# Patient Record
Sex: Female | Born: 1950
Health system: Southern US, Community
[De-identification: ages and names within clinical notes are randomized; demographics above are authoritative.]

## PROBLEM LIST (undated history)

## (undated) DIAGNOSIS — F32A Depression, unspecified: Secondary | ICD-10-CM

## (undated) DIAGNOSIS — I509 Heart failure, unspecified: Secondary | ICD-10-CM

## (undated) DIAGNOSIS — Z8719 Personal history of other diseases of the digestive system: Secondary | ICD-10-CM

## (undated) DIAGNOSIS — R0600 Dyspnea, unspecified: Secondary | ICD-10-CM

## (undated) DIAGNOSIS — G459 Transient cerebral ischemic attack, unspecified: Secondary | ICD-10-CM

## (undated) DIAGNOSIS — I219 Acute myocardial infarction, unspecified: Secondary | ICD-10-CM

## (undated) DIAGNOSIS — I499 Cardiac arrhythmia, unspecified: Secondary | ICD-10-CM

## (undated) DIAGNOSIS — R51 Headache: Secondary | ICD-10-CM

## (undated) DIAGNOSIS — I2510 Atherosclerotic heart disease of native coronary artery without angina pectoris: Secondary | ICD-10-CM

## (undated) DIAGNOSIS — M1990 Unspecified osteoarthritis, unspecified site: Secondary | ICD-10-CM

## (undated) DIAGNOSIS — J189 Pneumonia, unspecified organism: Secondary | ICD-10-CM

## (undated) DIAGNOSIS — I10 Essential (primary) hypertension: Secondary | ICD-10-CM

## (undated) DIAGNOSIS — R498 Other voice and resonance disorders: Secondary | ICD-10-CM

## (undated) DIAGNOSIS — F419 Anxiety disorder, unspecified: Secondary | ICD-10-CM

## (undated) DIAGNOSIS — IMO0001 Reserved for inherently not codable concepts without codable children: Secondary | ICD-10-CM

## (undated) DIAGNOSIS — F410 Panic disorder [episodic paroxysmal anxiety] without agoraphobia: Secondary | ICD-10-CM

## (undated) DIAGNOSIS — F329 Major depressive disorder, single episode, unspecified: Secondary | ICD-10-CM

## (undated) DIAGNOSIS — K219 Gastro-esophageal reflux disease without esophagitis: Secondary | ICD-10-CM

## (undated) DIAGNOSIS — E785 Hyperlipidemia, unspecified: Secondary | ICD-10-CM

## (undated) DIAGNOSIS — I4891 Unspecified atrial fibrillation: Secondary | ICD-10-CM

## (undated) DIAGNOSIS — D649 Anemia, unspecified: Secondary | ICD-10-CM

## (undated) DIAGNOSIS — T7840XA Allergy, unspecified, initial encounter: Secondary | ICD-10-CM

## (undated) DIAGNOSIS — J449 Chronic obstructive pulmonary disease, unspecified: Secondary | ICD-10-CM

## (undated) DIAGNOSIS — E119 Type 2 diabetes mellitus without complications: Secondary | ICD-10-CM

## (undated) DIAGNOSIS — Z5189 Encounter for other specified aftercare: Secondary | ICD-10-CM

## (undated) DIAGNOSIS — I639 Cerebral infarction, unspecified: Secondary | ICD-10-CM

## (undated) DIAGNOSIS — R519 Headache, unspecified: Secondary | ICD-10-CM

## (undated) HISTORY — DX: Cerebral infarction, unspecified: I63.9

## (undated) HISTORY — DX: Type 2 diabetes mellitus without complications: E11.9

## (undated) HISTORY — DX: Cardiac arrhythmia, unspecified: I49.9

## (undated) HISTORY — DX: Allergy, unspecified, initial encounter: T78.40XA

## (undated) HISTORY — PX: TUBAL LIGATION: SHX77

## (undated) HISTORY — DX: Anemia, unspecified: D64.9

## (undated) HISTORY — DX: Gastro-esophageal reflux disease without esophagitis: K21.9

## (undated) HISTORY — DX: Essential (primary) hypertension: I10

## (undated) HISTORY — DX: Unspecified osteoarthritis, unspecified site: M19.90

## (undated) HISTORY — DX: Major depressive disorder, single episode, unspecified: F32.9

## (undated) HISTORY — DX: Chronic obstructive pulmonary disease, unspecified: J44.9

## (undated) HISTORY — DX: Transient cerebral ischemic attack, unspecified: G45.9

## (undated) HISTORY — DX: Heart failure, unspecified: I50.9

## (undated) HISTORY — DX: Hyperlipidemia, unspecified: E78.5

## (undated) HISTORY — DX: Reserved for inherently not codable concepts without codable children: IMO0001

## (undated) HISTORY — DX: Depression, unspecified: F32.A

## (undated) HISTORY — PX: ABDOMINAL HYSTERECTOMY: SHX81

## (undated) HISTORY — PX: OOPHORECTOMY: SHX86

## (undated) HISTORY — DX: Anxiety disorder, unspecified: F41.9

---

## 1973-03-29 HISTORY — PX: APPENDECTOMY: SHX54

## 2003-01-09 ENCOUNTER — Encounter: Payer: Self-pay | Admitting: *Deleted

## 2003-01-09 ENCOUNTER — Emergency Department (HOSPITAL_COMMUNITY): Admission: EM | Admit: 2003-01-09 | Discharge: 2003-01-10 | Payer: Self-pay | Admitting: *Deleted

## 2004-07-31 ENCOUNTER — Ambulatory Visit: Payer: Self-pay | Admitting: Internal Medicine

## 2004-09-18 ENCOUNTER — Emergency Department: Payer: Self-pay | Admitting: Emergency Medicine

## 2005-08-02 ENCOUNTER — Emergency Department: Payer: Self-pay | Admitting: Emergency Medicine

## 2007-09-23 ENCOUNTER — Emergency Department: Payer: Self-pay | Admitting: Unknown Physician Specialty

## 2008-08-02 ENCOUNTER — Emergency Department: Payer: Self-pay | Admitting: Emergency Medicine

## 2009-03-21 ENCOUNTER — Emergency Department: Payer: Self-pay | Admitting: Internal Medicine

## 2009-03-24 ENCOUNTER — Emergency Department: Payer: Self-pay | Admitting: Emergency Medicine

## 2009-06-19 ENCOUNTER — Inpatient Hospital Stay: Payer: Self-pay | Admitting: Internal Medicine

## 2009-07-14 ENCOUNTER — Ambulatory Visit: Payer: Self-pay

## 2009-12-29 ENCOUNTER — Ambulatory Visit: Payer: Self-pay | Admitting: Adult Health

## 2010-03-24 ENCOUNTER — Emergency Department: Payer: Self-pay | Admitting: Unknown Physician Specialty

## 2010-04-28 ENCOUNTER — Emergency Department: Payer: Self-pay | Admitting: Emergency Medicine

## 2010-06-10 ENCOUNTER — Emergency Department: Payer: Self-pay | Admitting: Emergency Medicine

## 2010-06-23 ENCOUNTER — Emergency Department: Payer: Self-pay | Admitting: Emergency Medicine

## 2010-11-01 ENCOUNTER — Emergency Department: Payer: Self-pay | Admitting: Emergency Medicine

## 2010-11-26 ENCOUNTER — Ambulatory Visit: Payer: Self-pay | Admitting: "Endocrinology

## 2011-01-26 ENCOUNTER — Ambulatory Visit: Payer: Self-pay

## 2011-06-24 ENCOUNTER — Emergency Department: Payer: Self-pay | Admitting: Emergency Medicine

## 2011-06-24 LAB — COMPREHENSIVE METABOLIC PANEL
Albumin: 3.5 g/dL (ref 3.4–5.0)
Alkaline Phosphatase: 138 U/L — ABNORMAL HIGH (ref 50–136)
Anion Gap: 11 (ref 7–16)
BUN: 17 mg/dL (ref 7–18)
Bilirubin,Total: 0.5 mg/dL (ref 0.2–1.0)
Calcium, Total: 8.6 mg/dL (ref 8.5–10.1)
Chloride: 98 mmol/L (ref 98–107)
Co2: 26 mmol/L (ref 21–32)
Creatinine: 0.69 mg/dL (ref 0.60–1.30)
EGFR (African American): >60
EGFR (Non-African Amer.): >60
Glucose: 431 mg/dL — ABNORMAL HIGH (ref 65–99)
Osmolality: 290 (ref 275–301)
Potassium: 3.9 mmol/L (ref 3.5–5.1)
SGOT(AST): 31 U/L (ref 15–37)
SGPT (ALT): 32 U/L
Sodium: 135 mmol/L — ABNORMAL LOW (ref 136–145)
Total Protein: 7.1 g/dL (ref 6.4–8.2)

## 2011-06-24 LAB — URINALYSIS, COMPLETE
Bacteria: NONE SEEN
Bilirubin,UR: NEGATIVE
Blood: NEGATIVE
Glucose,UR: >=500 mg/dL (ref 0–75)
Ketone: NEGATIVE
Nitrite: NEGATIVE
Ph: 6 (ref 4.5–8.0)
Protein: NEGATIVE
RBC,UR: 7 /HPF (ref 0–5)
Specific Gravity: 1.024 (ref 1.003–1.030)
Squamous Epithelial: 2
WBC UR: 21 /HPF (ref 0–5)

## 2011-06-24 LAB — CBC
HCT: 38 % (ref 35.0–47.0)
HGB: 12.8 g/dL (ref 12.0–16.0)
MCH: 32 pg (ref 26.0–34.0)
MCHC: 33.5 g/dL (ref 32.0–36.0)
MCV: 96 fL (ref 80–100)
Platelet: 222 10*3/uL (ref 150–440)
RBC: 3.98 10*6/uL (ref 3.80–5.20)
RDW: 14.5 % (ref 11.5–14.5)
WBC: 10.5 10*3/uL (ref 3.6–11.0)

## 2011-09-17 ENCOUNTER — Emergency Department: Payer: Self-pay | Admitting: Emergency Medicine

## 2011-09-17 LAB — COMPREHENSIVE METABOLIC PANEL
Albumin: 3.7 g/dL (ref 3.4–5.0)
Alkaline Phosphatase: 144 U/L — ABNORMAL HIGH (ref 50–136)
Anion Gap: 9 (ref 7–16)
BUN: 10 mg/dL (ref 7–18)
Bilirubin,Total: 0.2 mg/dL (ref 0.2–1.0)
Calcium, Total: 8.8 mg/dL (ref 8.5–10.1)
Chloride: 107 mmol/L (ref 98–107)
Co2: 25 mmol/L (ref 21–32)
Creatinine: 0.8 mg/dL (ref 0.60–1.30)
EGFR (African American): >60
EGFR (Non-African Amer.): >60
Glucose: 205 mg/dL — ABNORMAL HIGH (ref 65–99)
Osmolality: 286 (ref 275–301)
Potassium: 3.4 mmol/L — ABNORMAL LOW (ref 3.5–5.1)
SGOT(AST): 18 U/L (ref 15–37)
SGPT (ALT): 15 U/L
Sodium: 141 mmol/L (ref 136–145)
Total Protein: 7.1 g/dL (ref 6.4–8.2)

## 2011-09-17 LAB — URINALYSIS, COMPLETE
Bilirubin,UR: NEGATIVE
Blood: NEGATIVE
Glucose,UR: 150 mg/dL (ref 0–75)
Ketone: NEGATIVE
Nitrite: NEGATIVE
Ph: 5 (ref 4.5–8.0)
Protein: 100
RBC,UR: 1 /HPF (ref 0–5)
Specific Gravity: 1.03 (ref 1.003–1.030)
Squamous Epithelial: 8
WBC UR: 11 /HPF (ref 0–5)

## 2011-09-17 LAB — CBC
HCT: 38.8 % (ref 35.0–47.0)
HGB: 12.8 g/dL (ref 12.0–16.0)
MCH: 31.2 pg (ref 26.0–34.0)
MCHC: 33 g/dL (ref 32.0–36.0)
MCV: 95 fL (ref 80–100)
Platelet: 210 10*3/uL (ref 150–440)
RBC: 4.1 10*6/uL (ref 3.80–5.20)
RDW: 12.7 % (ref 11.5–14.5)
WBC: 6.8 10*3/uL (ref 3.6–11.0)

## 2011-09-17 LAB — LIPASE, BLOOD: Lipase: 186 U/L (ref 73–393)

## 2011-09-19 LAB — URINE CULTURE

## 2011-09-20 ENCOUNTER — Inpatient Hospital Stay: Payer: Self-pay | Admitting: Internal Medicine

## 2011-09-20 LAB — URINALYSIS, COMPLETE
Bacteria: NONE SEEN
Bilirubin,UR: NEGATIVE
Glucose,UR: >=500 mg/dL (ref 0–75)
Ketone: NEGATIVE
Nitrite: NEGATIVE
Ph: 6 (ref 4.5–8.0)
Protein: 30
RBC,UR: 5 /HPF (ref 0–5)
Specific Gravity: 1.031 (ref 1.003–1.030)
Squamous Epithelial: 5
WBC UR: 20 /HPF (ref 0–5)

## 2011-09-20 LAB — BASIC METABOLIC PANEL
Anion Gap: 8 (ref 7–16)
BUN: 11 mg/dL (ref 7–18)
Calcium, Total: 9.1 mg/dL (ref 8.5–10.1)
Chloride: 103 mmol/L (ref 98–107)
Co2: 25 mmol/L (ref 21–32)
Creatinine: 0.79 mg/dL (ref 0.60–1.30)
EGFR (African American): >60
EGFR (Non-African Amer.): >60
Glucose: 426 mg/dL — ABNORMAL HIGH (ref 65–99)
Osmolality: 290 (ref 275–301)
Potassium: 3.9 mmol/L (ref 3.5–5.1)
Sodium: 136 mmol/L (ref 136–145)

## 2011-09-20 LAB — CBC
HCT: 39.5 % (ref 35.0–47.0)
HGB: 12.9 g/dL (ref 12.0–16.0)
MCH: 31.2 pg (ref 26.0–34.0)
MCHC: 32.8 g/dL (ref 32.0–36.0)
MCV: 95 fL (ref 80–100)
Platelet: 196 10*3/uL (ref 150–440)
RBC: 4.15 10*6/uL (ref 3.80–5.20)
RDW: 12.7 % (ref 11.5–14.5)
WBC: 6.6 10*3/uL (ref 3.6–11.0)

## 2011-09-21 LAB — CBC WITH DIFFERENTIAL/PLATELET
Basophil #: 0 10*3/uL (ref 0.0–0.1)
Basophil %: 0.8 %
Eosinophil #: 0.2 10*3/uL (ref 0.0–0.7)
Eosinophil %: 2.8 %
HCT: 37.8 % (ref 35.0–47.0)
HGB: 12.7 g/dL (ref 12.0–16.0)
Lymphocyte #: 2.9 10*3/uL (ref 1.0–3.6)
Lymphocyte %: 49.2 %
MCH: 31.6 pg (ref 26.0–34.0)
MCHC: 33.6 g/dL (ref 32.0–36.0)
MCV: 94 fL (ref 80–100)
Monocyte #: 0.5 x10 3/mm (ref 0.2–0.9)
Monocyte %: 9 %
Neutrophil #: 2.3 10*3/uL (ref 1.4–6.5)
Neutrophil %: 38.2 %
Platelet: 185 10*3/uL (ref 150–440)
RBC: 4.02 10*6/uL (ref 3.80–5.20)
RDW: 13.1 % (ref 11.5–14.5)
WBC: 5.9 10*3/uL (ref 3.6–11.0)

## 2011-09-21 LAB — BASIC METABOLIC PANEL
Anion Gap: 7 (ref 7–16)
BUN: 9 mg/dL (ref 7–18)
Calcium, Total: 8.6 mg/dL (ref 8.5–10.1)
Chloride: 107 mmol/L (ref 98–107)
Co2: 27 mmol/L (ref 21–32)
Creatinine: 0.63 mg/dL (ref 0.60–1.30)
EGFR (African American): >60
EGFR (Non-African Amer.): >60
Glucose: 190 mg/dL — ABNORMAL HIGH (ref 65–99)
Osmolality: 285 (ref 275–301)
Potassium: 3.4 mmol/L — ABNORMAL LOW (ref 3.5–5.1)
Sodium: 141 mmol/L (ref 136–145)

## 2011-09-21 LAB — HEMOGLOBIN A1C: Hemoglobin A1C: 13.6 % — ABNORMAL HIGH (ref 4.2–6.3)

## 2011-09-21 LAB — TSH: Thyroid Stimulating Horm: 1.99 u[IU]/mL

## 2011-09-22 LAB — URINE CULTURE

## 2011-10-18 ENCOUNTER — Ambulatory Visit: Payer: Self-pay | Admitting: Adult Health

## 2012-01-06 ENCOUNTER — Observation Stay: Payer: Self-pay | Admitting: Internal Medicine

## 2012-01-06 DIAGNOSIS — R079 Chest pain, unspecified: Secondary | ICD-10-CM

## 2012-01-06 LAB — CBC WITH DIFFERENTIAL/PLATELET
Basophil #: 0.1 10*3/uL (ref 0.0–0.1)
Basophil %: 1.4 %
Eosinophil #: 0.3 10*3/uL (ref 0.0–0.7)
Eosinophil %: 4 %
HCT: 38.1 % (ref 35.0–47.0)
HGB: 13.4 g/dL (ref 12.0–16.0)
Lymphocyte #: 3.5 10*3/uL (ref 1.0–3.6)
Lymphocyte %: 43.2 %
MCH: 32.4 pg (ref 26.0–34.0)
MCHC: 35 g/dL (ref 32.0–36.0)
MCV: 92 fL (ref 80–100)
Monocyte #: 0.6 x10 3/mm (ref 0.2–0.9)
Monocyte %: 8 %
Neutrophil #: 3.5 10*3/uL (ref 1.4–6.5)
Neutrophil %: 43.4 %
Platelet: 256 10*3/uL (ref 150–440)
RBC: 4.13 10*6/uL (ref 3.80–5.20)
RDW: 14 % (ref 11.5–14.5)
WBC: 8 10*3/uL (ref 3.6–11.0)

## 2012-01-06 LAB — COMPREHENSIVE METABOLIC PANEL
Albumin: 4 g/dL (ref 3.4–5.0)
Alkaline Phosphatase: 122 U/L (ref 50–136)
Anion Gap: 11 (ref 7–16)
BUN: 10 mg/dL (ref 7–18)
Bilirubin,Total: 0.4 mg/dL (ref 0.2–1.0)
Calcium, Total: 8.9 mg/dL (ref 8.5–10.1)
Chloride: 108 mmol/L — ABNORMAL HIGH (ref 98–107)
Co2: 24 mmol/L (ref 21–32)
Creatinine: 0.47 mg/dL — ABNORMAL LOW (ref 0.60–1.30)
EGFR (African American): >60
EGFR (Non-African Amer.): >60
Glucose: 150 mg/dL — ABNORMAL HIGH (ref 65–99)
Osmolality: 287 (ref 275–301)
Potassium: 3.7 mmol/L (ref 3.5–5.1)
SGOT(AST): 26 U/L (ref 15–37)
SGPT (ALT): 19 U/L (ref 12–78)
Sodium: 143 mmol/L (ref 136–145)
Total Protein: 7.7 g/dL (ref 6.4–8.2)

## 2012-01-06 LAB — URINALYSIS, COMPLETE
Bacteria: NONE SEEN
Bilirubin,UR: NEGATIVE
Blood: NEGATIVE
Glucose,UR: NEGATIVE mg/dL (ref 0–75)
Ketone: NEGATIVE
Leukocyte Esterase: NEGATIVE
Nitrite: NEGATIVE
Ph: 6 (ref 4.5–8.0)
Protein: NEGATIVE
RBC,UR: 1 /HPF (ref 0–5)
Specific Gravity: 1.017 (ref 1.003–1.030)
Squamous Epithelial: 2
WBC UR: 1 /HPF (ref 0–5)

## 2012-01-06 LAB — CK TOTAL AND CKMB (NOT AT ARMC)
CK, Total: 119 U/L (ref 21–215)
CK, Total: 87 U/L (ref 21–215)
CK-MB: 3.9 ng/mL — ABNORMAL HIGH (ref 0.5–3.6)
CK-MB: 3.2 ng/mL (ref 0.5–3.6)

## 2012-01-06 LAB — TROPONIN I
Troponin-I: <0.02 ng/mL
Troponin-I: <0.02 ng/mL

## 2012-01-23 ENCOUNTER — Observation Stay: Payer: Self-pay | Admitting: Internal Medicine

## 2012-01-23 LAB — URINALYSIS, COMPLETE
Bacteria: NONE SEEN
Bilirubin,UR: NEGATIVE
Glucose,UR: NEGATIVE mg/dL (ref 0–75)
Ketone: NEGATIVE
Nitrite: NEGATIVE
Ph: 6 (ref 4.5–8.0)
Protein: 30
RBC,UR: 4 /HPF (ref 0–5)
Specific Gravity: 1.018 (ref 1.003–1.030)
Squamous Epithelial: 1
WBC UR: 1 /HPF (ref 0–5)

## 2012-01-23 LAB — COMPREHENSIVE METABOLIC PANEL
Albumin: 4.1 g/dL (ref 3.4–5.0)
Alkaline Phosphatase: 115 U/L (ref 50–136)
Anion Gap: 9 (ref 7–16)
BUN: 11 mg/dL (ref 7–18)
Bilirubin,Total: 0.2 mg/dL (ref 0.2–1.0)
Calcium, Total: 9.1 mg/dL (ref 8.5–10.1)
Chloride: 108 mmol/L — ABNORMAL HIGH (ref 98–107)
Co2: 26 mmol/L (ref 21–32)
Creatinine: 0.65 mg/dL (ref 0.60–1.30)
EGFR (African American): >60
EGFR (Non-African Amer.): >60
Glucose: 111 mg/dL — ABNORMAL HIGH (ref 65–99)
Osmolality: 285 (ref 275–301)
Potassium: 4.2 mmol/L (ref 3.5–5.1)
SGOT(AST): 28 U/L (ref 15–37)
SGPT (ALT): 18 U/L (ref 12–78)
Sodium: 143 mmol/L (ref 136–145)
Total Protein: 7.7 g/dL (ref 6.4–8.2)

## 2012-01-23 LAB — CBC
HCT: 39.1 % (ref 35.0–47.0)
HGB: 13.2 g/dL (ref 12.0–16.0)
MCH: 31.9 pg (ref 26.0–34.0)
MCHC: 33.9 g/dL (ref 32.0–36.0)
MCV: 94 fL (ref 80–100)
Platelet: 226 10*3/uL (ref 150–440)
RBC: 4.15 10*6/uL (ref 3.80–5.20)
RDW: 13.9 % (ref 11.5–14.5)
WBC: 8.4 10*3/uL (ref 3.6–11.0)

## 2012-01-23 LAB — TROPONIN I
Troponin-I: <0.02 ng/mL
Troponin-I: <0.02 ng/mL

## 2012-01-23 LAB — TSH: Thyroid Stimulating Horm: 0.763 u[IU]/mL

## 2012-01-23 LAB — CK TOTAL AND CKMB (NOT AT ARMC)
CK, Total: 95 U/L (ref 21–215)
CK, Total: 74 U/L (ref 21–215)
CK-MB: 3 ng/mL (ref 0.5–3.6)
CK-MB: 2.4 ng/mL (ref 0.5–3.6)

## 2012-01-23 LAB — MAGNESIUM: Magnesium: 1.7 mg/dL — ABNORMAL LOW

## 2012-01-24 DIAGNOSIS — R079 Chest pain, unspecified: Secondary | ICD-10-CM

## 2012-01-24 LAB — BASIC METABOLIC PANEL
Anion Gap: 8 (ref 7–16)
BUN: 11 mg/dL (ref 7–18)
Calcium, Total: 9 mg/dL (ref 8.5–10.1)
Chloride: 106 mmol/L (ref 98–107)
Co2: 30 mmol/L (ref 21–32)
Creatinine: 0.67 mg/dL (ref 0.60–1.30)
EGFR (African American): >60
EGFR (Non-African Amer.): >60
Glucose: 116 mg/dL — ABNORMAL HIGH (ref 65–99)
Osmolality: 287 (ref 275–301)
Potassium: 3.7 mmol/L (ref 3.5–5.1)
Sodium: 144 mmol/L (ref 136–145)

## 2012-01-24 LAB — CK TOTAL AND CKMB (NOT AT ARMC)
CK, Total: 65 U/L (ref 21–215)
CK-MB: 2.5 ng/mL (ref 0.5–3.6)

## 2012-01-24 LAB — LIPID PANEL
Cholesterol: 179 mg/dL (ref 0–200)
HDL Cholesterol: 57 mg/dL (ref 40–60)
Ldl Cholesterol, Calc: 102 mg/dL — ABNORMAL HIGH (ref 0–100)
Triglycerides: 100 mg/dL (ref 0–200)
VLDL Cholesterol, Calc: 20 mg/dL (ref 5–40)

## 2012-01-24 LAB — HEMOGLOBIN A1C: Hemoglobin A1C: 6.8 % — ABNORMAL HIGH (ref 4.2–6.3)

## 2012-01-24 LAB — TROPONIN I: Troponin-I: <0.02 ng/mL

## 2012-04-30 ENCOUNTER — Observation Stay: Payer: Self-pay | Admitting: Internal Medicine

## 2012-04-30 LAB — CBC
HCT: 40.6 % (ref 35.0–47.0)
HGB: 13.6 g/dL (ref 12.0–16.0)
MCH: 31.4 pg (ref 26.0–34.0)
MCHC: 33.4 g/dL (ref 32.0–36.0)
MCV: 94 fL (ref 80–100)
Platelet: 246 10*3/uL (ref 150–440)
RBC: 4.32 10*6/uL (ref 3.80–5.20)
RDW: 13.3 % (ref 11.5–14.5)
WBC: 7.7 10*3/uL (ref 3.6–11.0)

## 2012-04-30 LAB — BASIC METABOLIC PANEL
Anion Gap: 7 (ref 7–16)
BUN: 12 mg/dL (ref 7–18)
Calcium, Total: 9.1 mg/dL (ref 8.5–10.1)
Chloride: 109 mmol/L — ABNORMAL HIGH (ref 98–107)
Co2: 26 mmol/L (ref 21–32)
Creatinine: 0.65 mg/dL (ref 0.60–1.30)
EGFR (African American): >60
EGFR (Non-African Amer.): >60
Glucose: 142 mg/dL — ABNORMAL HIGH (ref 65–99)
Osmolality: 285 (ref 275–301)
Potassium: 4.2 mmol/L (ref 3.5–5.1)
Sodium: 142 mmol/L (ref 136–145)

## 2012-04-30 LAB — URINALYSIS, COMPLETE
Bacteria: NONE SEEN
Bilirubin,UR: NEGATIVE
Blood: NEGATIVE
Glucose,UR: NEGATIVE mg/dL (ref 0–75)
Ketone: NEGATIVE
Nitrite: NEGATIVE
Ph: 6 (ref 4.5–8.0)
Protein: NEGATIVE
RBC,UR: 1 /HPF (ref 0–5)
Specific Gravity: 1.01 (ref 1.003–1.030)
Squamous Epithelial: 1
WBC UR: 2 /HPF (ref 0–5)

## 2012-04-30 LAB — CK TOTAL AND CKMB (NOT AT ARMC)
CK, Total: 123 U/L (ref 21–215)
CK-MB: 4.9 ng/mL — ABNORMAL HIGH (ref 0.5–3.6)

## 2012-04-30 LAB — TROPONIN I
Troponin-I: <0.02 ng/mL
Troponin-I: <0.02 ng/mL

## 2012-05-01 LAB — BASIC METABOLIC PANEL
Anion Gap: 10 (ref 7–16)
BUN: 16 mg/dL (ref 7–18)
Calcium, Total: 8.8 mg/dL (ref 8.5–10.1)
Chloride: 104 mmol/L (ref 98–107)
Co2: 27 mmol/L (ref 21–32)
Creatinine: 0.74 mg/dL (ref 0.60–1.30)
EGFR (African American): >60
EGFR (Non-African Amer.): >60
Glucose: 116 mg/dL — ABNORMAL HIGH (ref 65–99)
Osmolality: 283 (ref 275–301)
Potassium: 4 mmol/L (ref 3.5–5.1)
Sodium: 141 mmol/L (ref 136–145)

## 2012-05-01 LAB — CBC WITH DIFFERENTIAL/PLATELET
Basophil #: 0.1 10*3/uL (ref 0.0–0.1)
Basophil %: 0.8 %
Eosinophil #: 0 10*3/uL (ref 0.0–0.7)
Eosinophil %: 0.5 %
HCT: 35.9 % (ref 35.0–47.0)
HGB: 11.9 g/dL — ABNORMAL LOW (ref 12.0–16.0)
Lymphocyte #: 2.8 10*3/uL (ref 1.0–3.6)
Lymphocyte %: 36.1 %
MCH: 31.5 pg (ref 26.0–34.0)
MCHC: 33.3 g/dL (ref 32.0–36.0)
MCV: 95 fL (ref 80–100)
Monocyte #: 0.4 x10 3/mm (ref 0.2–0.9)
Monocyte %: 5.3 %
Neutrophil #: 4.4 10*3/uL (ref 1.4–6.5)
Neutrophil %: 57.3 %
Platelet: 225 10*3/uL (ref 150–440)
RBC: 3.79 10*6/uL — ABNORMAL LOW (ref 3.80–5.20)
RDW: 13.2 % (ref 11.5–14.5)
WBC: 7.8 10*3/uL (ref 3.6–11.0)

## 2012-05-01 LAB — LIPID PANEL
Cholesterol: 234 mg/dL — ABNORMAL HIGH (ref 0–200)
HDL Cholesterol: 56 mg/dL (ref 40–60)
Ldl Cholesterol, Calc: 158 mg/dL — ABNORMAL HIGH (ref 0–100)
Triglycerides: 99 mg/dL (ref 0–200)
VLDL Cholesterol, Calc: 20 mg/dL (ref 5–40)

## 2012-05-01 LAB — HEMOGLOBIN A1C: Hemoglobin A1C: 7.2 % — ABNORMAL HIGH (ref 4.2–6.3)

## 2012-05-01 LAB — TROPONIN I: Troponin-I: <0.02 ng/mL

## 2012-05-02 LAB — BASIC METABOLIC PANEL
Anion Gap: 5 — ABNORMAL LOW (ref 7–16)
BUN: 12 mg/dL (ref 7–18)
Calcium, Total: 8.6 mg/dL (ref 8.5–10.1)
Chloride: 106 mmol/L (ref 98–107)
Co2: 27 mmol/L (ref 21–32)
Creatinine: 0.78 mg/dL (ref 0.60–1.30)
EGFR (African American): >60
EGFR (Non-African Amer.): >60
Glucose: 73 mg/dL (ref 65–99)
Osmolality: 274 (ref 275–301)
Potassium: 3.8 mmol/L (ref 3.5–5.1)
Sodium: 138 mmol/L (ref 136–145)

## 2012-05-02 LAB — PROTIME-INR
INR: 1
Prothrombin Time: 13.2 secs (ref 11.5–14.7)

## 2012-05-02 LAB — CBC
HCT: 36.1 % (ref 35.0–47.0)
HGB: 12.1 g/dL (ref 12.0–16.0)
MCH: 31.7 pg (ref 26.0–34.0)
MCHC: 33.6 g/dL (ref 32.0–36.0)
MCV: 94 fL (ref 80–100)
Platelet: 210 10*3/uL (ref 150–440)
RBC: 3.83 10*6/uL (ref 3.80–5.20)
RDW: 12.9 % (ref 11.5–14.5)
WBC: 8.2 10*3/uL (ref 3.6–11.0)

## 2012-08-01 ENCOUNTER — Emergency Department: Payer: Self-pay | Admitting: Emergency Medicine

## 2012-08-01 LAB — BASIC METABOLIC PANEL
Anion Gap: 6 — ABNORMAL LOW (ref 7–16)
BUN: 11 mg/dL (ref 7–18)
Calcium, Total: 9.3 mg/dL (ref 8.5–10.1)
Chloride: 111 mmol/L — ABNORMAL HIGH (ref 98–107)
Co2: 24 mmol/L (ref 21–32)
Creatinine: 0.76 mg/dL (ref 0.60–1.30)
EGFR (African American): >60
EGFR (Non-African Amer.): >60
Glucose: 70 mg/dL (ref 65–99)
Osmolality: 279 (ref 275–301)
Potassium: 4.1 mmol/L (ref 3.5–5.1)
Sodium: 141 mmol/L (ref 136–145)

## 2012-08-01 LAB — CK TOTAL AND CKMB (NOT AT ARMC)
CK, Total: 91 U/L (ref 21–215)
CK-MB: 2.3 ng/mL (ref 0.5–3.6)

## 2012-08-01 LAB — CBC
HCT: 38.7 % (ref 35.0–47.0)
HGB: 13.1 g/dL (ref 12.0–16.0)
MCH: 31.7 pg (ref 26.0–34.0)
MCHC: 33.9 g/dL (ref 32.0–36.0)
MCV: 94 fL (ref 80–100)
Platelet: 257 10*3/uL (ref 150–440)
RBC: 4.14 10*6/uL (ref 3.80–5.20)
RDW: 13.9 % (ref 11.5–14.5)
WBC: 8.9 10*3/uL (ref 3.6–11.0)

## 2012-08-01 LAB — TROPONIN I: Troponin-I: <0.02 ng/mL

## 2013-01-22 ENCOUNTER — Emergency Department: Payer: Self-pay | Admitting: Emergency Medicine

## 2013-01-22 LAB — LIPASE, BLOOD: Lipase: 133 U/L (ref 73–393)

## 2013-01-22 LAB — URINALYSIS, COMPLETE
Bilirubin,UR: NEGATIVE
Glucose,UR: NEGATIVE mg/dL (ref 0–75)
Hyaline Cast: 2
Ketone: NEGATIVE
Leukocyte Esterase: NEGATIVE
Nitrite: NEGATIVE
Ph: 5 (ref 4.5–8.0)
Protein: 30
RBC,UR: 5 /HPF (ref 0–5)
Specific Gravity: 1.026 (ref 1.003–1.030)
Squamous Epithelial: 1
WBC UR: 3 /HPF (ref 0–5)

## 2013-01-22 LAB — CBC
HCT: 38.7 % (ref 35.0–47.0)
HGB: 13.6 g/dL (ref 12.0–16.0)
MCH: 32.7 pg (ref 26.0–34.0)
MCHC: 35.1 g/dL (ref 32.0–36.0)
MCV: 93 fL (ref 80–100)
Platelet: 215 10*3/uL (ref 150–440)
RBC: 4.16 10*6/uL (ref 3.80–5.20)
RDW: 13.1 % (ref 11.5–14.5)
WBC: 7.5 10*3/uL (ref 3.6–11.0)

## 2013-01-22 LAB — COMPREHENSIVE METABOLIC PANEL
Albumin: 3.8 g/dL (ref 3.4–5.0)
Alkaline Phosphatase: 101 U/L (ref 50–136)
Anion Gap: 6 — ABNORMAL LOW (ref 7–16)
BUN: 13 mg/dL (ref 7–18)
Bilirubin,Total: 0.7 mg/dL (ref 0.2–1.0)
Calcium, Total: 8.9 mg/dL (ref 8.5–10.1)
Chloride: 108 mmol/L — ABNORMAL HIGH (ref 98–107)
Co2: 25 mmol/L (ref 21–32)
Creatinine: 0.5 mg/dL — ABNORMAL LOW (ref 0.60–1.30)
EGFR (African American): >60
EGFR (Non-African Amer.): >60
Glucose: 141 mg/dL — ABNORMAL HIGH (ref 65–99)
Osmolality: 280 (ref 275–301)
Potassium: 4.1 mmol/L (ref 3.5–5.1)
SGOT(AST): 32 U/L (ref 15–37)
SGPT (ALT): 19 U/L (ref 12–78)
Sodium: 139 mmol/L (ref 136–145)
Total Protein: 7.4 g/dL (ref 6.4–8.2)

## 2013-01-22 LAB — TROPONIN I: Troponin-I: <0.02 ng/mL

## 2013-03-29 DIAGNOSIS — I639 Cerebral infarction, unspecified: Secondary | ICD-10-CM

## 2013-03-29 HISTORY — PX: COLONOSCOPY: SHX174

## 2013-03-29 HISTORY — DX: Cerebral infarction, unspecified: I63.9

## 2013-04-14 ENCOUNTER — Emergency Department: Payer: Self-pay | Admitting: Internal Medicine

## 2013-04-14 LAB — CBC
HCT: 43.3 % (ref 35.0–47.0)
HGB: 14.4 g/dL (ref 12.0–16.0)
MCH: 31 pg (ref 26.0–34.0)
MCHC: 33.4 g/dL (ref 32.0–36.0)
MCV: 93 fL (ref 80–100)
Platelet: 243 10*3/uL (ref 150–440)
RBC: 4.66 10*6/uL (ref 3.80–5.20)
RDW: 13.3 % (ref 11.5–14.5)
WBC: 13.4 10*3/uL — ABNORMAL HIGH (ref 3.6–11.0)

## 2013-04-14 LAB — COMPREHENSIVE METABOLIC PANEL
Albumin: 3.8 g/dL (ref 3.4–5.0)
Alkaline Phosphatase: 125 U/L — ABNORMAL HIGH
Anion Gap: 5 — ABNORMAL LOW (ref 7–16)
BUN: 15 mg/dL (ref 7–18)
Bilirubin,Total: 0.4 mg/dL (ref 0.2–1.0)
Calcium, Total: 9 mg/dL (ref 8.5–10.1)
Chloride: 105 mmol/L (ref 98–107)
Co2: 27 mmol/L (ref 21–32)
Creatinine: 0.75 mg/dL (ref 0.60–1.30)
EGFR (African American): >60
EGFR (Non-African Amer.): >60
Glucose: 206 mg/dL — ABNORMAL HIGH (ref 65–99)
Osmolality: 281 (ref 275–301)
Potassium: 3.9 mmol/L (ref 3.5–5.1)
SGOT(AST): 23 U/L (ref 15–37)
SGPT (ALT): 18 U/L (ref 12–78)
Sodium: 137 mmol/L (ref 136–145)
Total Protein: 7.8 g/dL (ref 6.4–8.2)

## 2013-04-14 LAB — URINALYSIS, COMPLETE
Bacteria: NONE SEEN
Bilirubin,UR: NEGATIVE
Blood: NEGATIVE
Glucose,UR: NEGATIVE mg/dL (ref 0–75)
Ketone: NEGATIVE
Leukocyte Esterase: NEGATIVE
Nitrite: NEGATIVE
Ph: 5 (ref 4.5–8.0)
Protein: 100
RBC,UR: 4 /HPF (ref 0–5)
Specific Gravity: 1.028 (ref 1.003–1.030)
Squamous Epithelial: 2
WBC UR: 2 /HPF (ref 0–5)

## 2013-04-14 LAB — TROPONIN I: Troponin-I: <0.02 ng/mL

## 2013-04-14 LAB — LIPASE, BLOOD: Lipase: 254 U/L (ref 73–393)

## 2013-04-14 LAB — RAPID INFLUENZA A&B ANTIGENS

## 2013-04-18 ENCOUNTER — Emergency Department: Payer: Self-pay | Admitting: Emergency Medicine

## 2013-07-13 ENCOUNTER — Inpatient Hospital Stay: Payer: Self-pay | Admitting: Internal Medicine

## 2013-07-13 LAB — CBC
HCT: 38.8 % (ref 35.0–47.0)
HGB: 12.8 g/dL (ref 12.0–16.0)
MCH: 31.4 pg (ref 26.0–34.0)
MCHC: 33.1 g/dL (ref 32.0–36.0)
MCV: 95 fL (ref 80–100)
Platelet: 271 10*3/uL (ref 150–440)
RBC: 4.08 10*6/uL (ref 3.80–5.20)
RDW: 13.6 % (ref 11.5–14.5)
WBC: 9.4 10*3/uL (ref 3.6–11.0)

## 2013-07-13 LAB — BASIC METABOLIC PANEL
Anion Gap: 7 (ref 7–16)
BUN: 27 mg/dL — ABNORMAL HIGH (ref 7–18)
Calcium, Total: 8.8 mg/dL (ref 8.5–10.1)
Chloride: 103 mmol/L (ref 98–107)
Co2: 27 mmol/L (ref 21–32)
Creatinine: 1.57 mg/dL — ABNORMAL HIGH (ref 0.60–1.30)
EGFR (African American): 41 — ABNORMAL LOW
EGFR (Non-African Amer.): 35 — ABNORMAL LOW
Glucose: 208 mg/dL — ABNORMAL HIGH (ref 65–99)
Osmolality: 285 (ref 275–301)
Potassium: 3.3 mmol/L — ABNORMAL LOW (ref 3.5–5.1)
Sodium: 137 mmol/L (ref 136–145)

## 2013-07-13 LAB — CK-MB: CK-MB: 6.4 ng/mL — ABNORMAL HIGH (ref 0.5–3.6)

## 2013-07-13 LAB — APTT: Activated PTT: 30.2 secs (ref 23.6–35.9)

## 2013-07-13 LAB — PRO B NATRIURETIC PEPTIDE: B-Type Natriuretic Peptide: 208 pg/mL — ABNORMAL HIGH (ref 0–125)

## 2013-07-13 LAB — TROPONIN I: Troponin-I: <0.02 ng/mL

## 2013-07-14 LAB — BASIC METABOLIC PANEL
Anion Gap: 9 (ref 7–16)
BUN: 29 mg/dL — ABNORMAL HIGH (ref 7–18)
Calcium, Total: 8.2 mg/dL — ABNORMAL LOW (ref 8.5–10.1)
Chloride: 107 mmol/L (ref 98–107)
Co2: 25 mmol/L (ref 21–32)
Creatinine: 1.8 mg/dL — ABNORMAL HIGH (ref 0.60–1.30)
EGFR (African American): 34 — ABNORMAL LOW
EGFR (Non-African Amer.): 30 — ABNORMAL LOW
Glucose: 138 mg/dL — ABNORMAL HIGH (ref 65–99)
Osmolality: 289 (ref 275–301)
Potassium: 3.8 mmol/L (ref 3.5–5.1)
Sodium: 141 mmol/L (ref 136–145)

## 2013-07-14 LAB — CK-MB
CK-MB: 6 ng/mL — ABNORMAL HIGH (ref 0.5–3.6)
CK-MB: 5.9 ng/mL — ABNORMAL HIGH (ref 0.5–3.6)

## 2013-07-14 LAB — LIPID PANEL
Cholesterol: 214 mg/dL — ABNORMAL HIGH (ref 0–200)
HDL Cholesterol: 36 mg/dL — ABNORMAL LOW (ref 40–60)
Ldl Cholesterol, Calc: 154 mg/dL — ABNORMAL HIGH (ref 0–100)
Triglycerides: 118 mg/dL (ref 0–200)
VLDL Cholesterol, Calc: 24 mg/dL (ref 5–40)

## 2013-07-14 LAB — LIPASE, BLOOD: Lipase: 272 U/L (ref 73–393)

## 2013-07-14 LAB — TROPONIN I
Troponin-I: <0.02 ng/mL
Troponin-I: <0.02 ng/mL

## 2013-07-14 LAB — APTT: Activated PTT: 30.5 secs (ref 23.6–35.9)

## 2013-07-14 LAB — HEMOGLOBIN A1C: Hemoglobin A1C: 8.3 % — ABNORMAL HIGH (ref 4.2–6.3)

## 2013-07-14 LAB — OCCULT BLOOD X 1 CARD TO LAB, STOOL: Occult Blood, Feces: NEGATIVE

## 2013-07-16 LAB — BASIC METABOLIC PANEL
Anion Gap: 4 — ABNORMAL LOW (ref 7–16)
BUN: 4 mg/dL — ABNORMAL LOW (ref 7–18)
Calcium, Total: 8.4 mg/dL — ABNORMAL LOW (ref 8.5–10.1)
Chloride: 110 mmol/L — ABNORMAL HIGH (ref 98–107)
Co2: 26 mmol/L (ref 21–32)
Creatinine: 0.68 mg/dL (ref 0.60–1.30)
EGFR (African American): >60
EGFR (Non-African Amer.): >60
Glucose: 115 mg/dL — ABNORMAL HIGH (ref 65–99)
Osmolality: 277 (ref 275–301)
Potassium: 3.9 mmol/L (ref 3.5–5.1)
Sodium: 140 mmol/L (ref 136–145)

## 2013-07-16 LAB — HEMOGLOBIN: HGB: 11.2 g/dL — ABNORMAL LOW (ref 12.0–16.0)

## 2013-07-18 LAB — PATHOLOGY REPORT

## 2013-08-02 ENCOUNTER — Other Ambulatory Visit: Payer: Self-pay | Admitting: Gastroenterology

## 2013-08-02 LAB — CLOSTRIDIUM DIFFICILE(ARMC)

## 2013-08-05 LAB — STOOL CULTURE

## 2013-09-14 LAB — CBC WITH DIFFERENTIAL/PLATELET
Basophil #: 0 10*3/uL (ref 0.0–0.1)
Basophil %: 0.5 %
Eosinophil #: 0.1 10*3/uL (ref 0.0–0.7)
Eosinophil %: 1 %
HCT: 37.4 % (ref 35.0–47.0)
HGB: 12.4 g/dL (ref 12.0–16.0)
Lymphocyte #: 4.3 10*3/uL — ABNORMAL HIGH (ref 1.0–3.6)
Lymphocyte %: 45.5 %
MCH: 32.2 pg (ref 26.0–34.0)
MCHC: 33.2 g/dL (ref 32.0–36.0)
MCV: 97 fL (ref 80–100)
Monocyte #: 0.8 x10 3/mm (ref 0.2–0.9)
Monocyte %: 8.1 %
Neutrophil #: 4.3 10*3/uL (ref 1.4–6.5)
Neutrophil %: 44.9 %
Platelet: 224 10*3/uL (ref 150–440)
RBC: 3.86 10*6/uL (ref 3.80–5.20)
RDW: 13.3 % (ref 11.5–14.5)
WBC: 9.6 10*3/uL (ref 3.6–11.0)

## 2013-09-14 LAB — COMPREHENSIVE METABOLIC PANEL
Albumin: 4 g/dL (ref 3.4–5.0)
Alkaline Phosphatase: 99 U/L
Anion Gap: 7 (ref 7–16)
BUN: 33 mg/dL — ABNORMAL HIGH (ref 7–18)
Bilirubin,Total: 0.3 mg/dL (ref 0.2–1.0)
Calcium, Total: 8.6 mg/dL (ref 8.5–10.1)
Chloride: 106 mmol/L (ref 98–107)
Co2: 25 mmol/L (ref 21–32)
Creatinine: 2.01 mg/dL — ABNORMAL HIGH (ref 0.60–1.30)
EGFR (African American): 30 — ABNORMAL LOW
EGFR (Non-African Amer.): 26 — ABNORMAL LOW
Glucose: 144 mg/dL — ABNORMAL HIGH (ref 65–99)
Osmolality: 285 (ref 275–301)
Potassium: 4 mmol/L (ref 3.5–5.1)
SGOT(AST): 15 U/L (ref 15–37)
SGPT (ALT): 21 U/L (ref 12–78)
Sodium: 138 mmol/L (ref 136–145)
Total Protein: 7 g/dL (ref 6.4–8.2)

## 2013-09-14 LAB — URINALYSIS, COMPLETE
Bacteria: NONE SEEN
Bilirubin,UR: NEGATIVE
Blood: NEGATIVE
Glucose,UR: NEGATIVE mg/dL (ref 0–75)
Hyaline Cast: 77
Nitrite: NEGATIVE
Ph: 5 (ref 4.5–8.0)
Protein: 30
RBC,UR: 8 /HPF (ref 0–5)
Specific Gravity: 1.03 (ref 1.003–1.030)
Squamous Epithelial: 23
WBC UR: 112 /HPF (ref 0–5)

## 2013-09-14 LAB — LIPASE, BLOOD: Lipase: 205 U/L (ref 73–393)

## 2013-09-14 LAB — TROPONIN I: Troponin-I: <0.02 ng/mL

## 2013-09-15 ENCOUNTER — Inpatient Hospital Stay: Payer: Self-pay | Admitting: Specialist

## 2013-09-15 LAB — OCCULT BLOOD X 1 CARD TO LAB, STOOL: Occult Blood, Feces: NEGATIVE

## 2013-09-15 LAB — CLOSTRIDIUM DIFFICILE(ARMC)

## 2013-09-16 LAB — BASIC METABOLIC PANEL
Anion Gap: 7 (ref 7–16)
BUN: 17 mg/dL (ref 7–18)
Calcium, Total: 8.2 mg/dL — ABNORMAL LOW (ref 8.5–10.1)
Chloride: 109 mmol/L — ABNORMAL HIGH (ref 98–107)
Co2: 25 mmol/L (ref 21–32)
Creatinine: 0.77 mg/dL (ref 0.60–1.30)
EGFR (African American): >60
EGFR (Non-African Amer.): >60
Glucose: 111 mg/dL — ABNORMAL HIGH (ref 65–99)
Osmolality: 283 (ref 275–301)
Potassium: 4.1 mmol/L (ref 3.5–5.1)
Sodium: 141 mmol/L (ref 136–145)

## 2013-09-16 LAB — CBC WITH DIFFERENTIAL/PLATELET
Basophil #: 0 10*3/uL (ref 0.0–0.1)
Basophil %: 0.7 %
Eosinophil #: 0.1 10*3/uL (ref 0.0–0.7)
Eosinophil %: 1.3 %
HCT: 33.2 % — ABNORMAL LOW (ref 35.0–47.0)
HGB: 11.2 g/dL — ABNORMAL LOW (ref 12.0–16.0)
Lymphocyte #: 2.7 10*3/uL (ref 1.0–3.6)
Lymphocyte %: 43.5 %
MCH: 32.5 pg (ref 26.0–34.0)
MCHC: 33.8 g/dL (ref 32.0–36.0)
MCV: 96 fL (ref 80–100)
Monocyte #: 0.4 x10 3/mm (ref 0.2–0.9)
Monocyte %: 6.1 %
Neutrophil #: 3 10*3/uL (ref 1.4–6.5)
Neutrophil %: 48.4 %
Platelet: 182 10*3/uL (ref 150–440)
RBC: 3.46 10*6/uL — ABNORMAL LOW (ref 3.80–5.20)
RDW: 13.2 % (ref 11.5–14.5)
WBC: 6.3 10*3/uL (ref 3.6–11.0)

## 2013-09-16 LAB — URINE CULTURE

## 2013-09-17 LAB — STOOL CULTURE

## 2013-10-05 DIAGNOSIS — R109 Unspecified abdominal pain: Secondary | ICD-10-CM | POA: Insufficient documentation

## 2013-10-05 DIAGNOSIS — R197 Diarrhea, unspecified: Secondary | ICD-10-CM | POA: Insufficient documentation

## 2013-10-05 DIAGNOSIS — K219 Gastro-esophageal reflux disease without esophagitis: Secondary | ICD-10-CM | POA: Insufficient documentation

## 2013-10-23 ENCOUNTER — Ambulatory Visit: Payer: Self-pay | Admitting: Gastroenterology

## 2013-10-23 LAB — HM COLONOSCOPY

## 2013-10-25 LAB — PATHOLOGY REPORT

## 2014-01-02 ENCOUNTER — Emergency Department: Payer: Self-pay | Admitting: Emergency Medicine

## 2014-01-02 LAB — CBC
HCT: 39.2 % (ref 35.0–47.0)
HGB: 13 g/dL (ref 12.0–16.0)
MCH: 31.7 pg (ref 26.0–34.0)
MCHC: 33.1 g/dL (ref 32.0–36.0)
MCV: 96 fL (ref 80–100)
Platelet: 249 10*3/uL (ref 150–440)
RBC: 4.08 10*6/uL (ref 3.80–5.20)
RDW: 13.2 % (ref 11.5–14.5)
WBC: 9.9 10*3/uL (ref 3.6–11.0)

## 2014-01-02 LAB — BASIC METABOLIC PANEL
Anion Gap: 7 (ref 7–16)
BUN: 12 mg/dL (ref 7–18)
Calcium, Total: 8.9 mg/dL (ref 8.5–10.1)
Chloride: 104 mmol/L (ref 98–107)
Co2: 24 mmol/L (ref 21–32)
Creatinine: 0.88 mg/dL (ref 0.60–1.30)
EGFR (African American): >60
EGFR (Non-African Amer.): >60
Glucose: 214 mg/dL — ABNORMAL HIGH (ref 65–99)
Potassium: 3.4 mmol/L — ABNORMAL LOW (ref 3.5–5.1)
Sodium: 135 mmol/L — ABNORMAL LOW (ref 136–145)

## 2014-01-02 LAB — PRO B NATRIURETIC PEPTIDE: B-Type Natriuretic Peptide: 50 pg/mL (ref 0–125)

## 2014-01-02 LAB — TROPONIN I: Troponin-I: <0.02 ng/mL

## 2014-02-03 ENCOUNTER — Emergency Department: Payer: Self-pay | Admitting: Emergency Medicine

## 2014-02-03 LAB — CBC
HCT: 42 % (ref 35.0–47.0)
HGB: 14.1 g/dL (ref 12.0–16.0)
MCH: 32.1 pg (ref 26.0–34.0)
MCHC: 33.7 g/dL (ref 32.0–36.0)
MCV: 96 fL (ref 80–100)
Platelet: 273 10*3/uL (ref 150–440)
RBC: 4.39 10*6/uL (ref 3.80–5.20)
RDW: 13.2 % (ref 11.5–14.5)
WBC: 9.9 10*3/uL (ref 3.6–11.0)

## 2014-02-03 LAB — PRO B NATRIURETIC PEPTIDE: B-Type Natriuretic Peptide: 56 pg/mL (ref 0–125)

## 2014-02-03 LAB — BASIC METABOLIC PANEL
Anion Gap: 7 (ref 7–16)
BUN: 11 mg/dL (ref 7–18)
Calcium, Total: 9.5 mg/dL (ref 8.5–10.1)
Chloride: 104 mmol/L (ref 98–107)
Co2: 28 mmol/L (ref 21–32)
Creatinine: 0.79 mg/dL (ref 0.60–1.30)
EGFR (African American): >60
EGFR (Non-African Amer.): >60
Glucose: 94 mg/dL (ref 65–99)
Osmolality: 277 (ref 275–301)
Potassium: 3.5 mmol/L (ref 3.5–5.1)
Sodium: 139 mmol/L (ref 136–145)

## 2014-02-03 LAB — TROPONIN I: Troponin-I: <0.02 ng/mL

## 2014-02-07 DIAGNOSIS — I10 Essential (primary) hypertension: Secondary | ICD-10-CM | POA: Insufficient documentation

## 2014-02-07 DIAGNOSIS — I2510 Atherosclerotic heart disease of native coronary artery without angina pectoris: Secondary | ICD-10-CM | POA: Insufficient documentation

## 2014-02-07 DIAGNOSIS — R002 Palpitations: Secondary | ICD-10-CM | POA: Insufficient documentation

## 2014-02-07 DIAGNOSIS — R0789 Other chest pain: Secondary | ICD-10-CM | POA: Insufficient documentation

## 2014-02-08 DIAGNOSIS — I6529 Occlusion and stenosis of unspecified carotid artery: Secondary | ICD-10-CM | POA: Insufficient documentation

## 2014-02-08 DIAGNOSIS — E785 Hyperlipidemia, unspecified: Secondary | ICD-10-CM | POA: Insufficient documentation

## 2014-02-08 DIAGNOSIS — E782 Mixed hyperlipidemia: Secondary | ICD-10-CM | POA: Insufficient documentation

## 2014-02-08 DIAGNOSIS — R079 Chest pain, unspecified: Secondary | ICD-10-CM | POA: Insufficient documentation

## 2014-02-08 DIAGNOSIS — I6523 Occlusion and stenosis of bilateral carotid arteries: Secondary | ICD-10-CM | POA: Insufficient documentation

## 2014-04-29 ENCOUNTER — Emergency Department: Payer: Self-pay | Admitting: Emergency Medicine

## 2014-04-29 LAB — TROPONIN I: Troponin-I: <0.02 ng/mL

## 2014-04-29 LAB — COMPREHENSIVE METABOLIC PANEL
Albumin: 4 g/dL (ref 3.4–5.0)
Alkaline Phosphatase: 112 U/L (ref 46–116)
Anion Gap: 10 (ref 7–16)
BUN: 10 mg/dL (ref 7–18)
Bilirubin,Total: 0.2 mg/dL (ref 0.2–1.0)
Calcium, Total: 9.8 mg/dL (ref 8.5–10.1)
Chloride: 103 mmol/L (ref 98–107)
Co2: 24 mmol/L (ref 21–32)
Creatinine: 0.78 mg/dL (ref 0.60–1.30)
EGFR (African American): >60
EGFR (Non-African Amer.): >60
Glucose: 249 mg/dL — ABNORMAL HIGH (ref 65–99)
Osmolality: 281 (ref 275–301)
Potassium: 3.5 mmol/L (ref 3.5–5.1)
SGOT(AST): 18 U/L (ref 15–37)
SGPT (ALT): 22 U/L (ref 14–63)
Sodium: 137 mmol/L (ref 136–145)
Total Protein: 7.6 g/dL (ref 6.4–8.2)

## 2014-04-29 LAB — CBC
HCT: 41.3 % (ref 35.0–47.0)
HGB: 13.8 g/dL (ref 12.0–16.0)
MCH: 31.4 pg (ref 26.0–34.0)
MCHC: 33.4 g/dL (ref 32.0–36.0)
MCV: 94 fL (ref 80–100)
Platelet: 223 10*3/uL (ref 150–440)
RBC: 4.4 10*6/uL (ref 3.80–5.20)
RDW: 13 % (ref 11.5–14.5)
WBC: 8.9 10*3/uL (ref 3.6–11.0)

## 2014-05-10 ENCOUNTER — Ambulatory Visit: Payer: Self-pay | Admitting: Family Medicine

## 2014-05-17 ENCOUNTER — Encounter: Payer: Self-pay | Admitting: Family Medicine

## 2014-05-28 ENCOUNTER — Encounter
Admit: 2014-05-28 | Disposition: A | Discharge status: Home / Self Care | Payer: Self-pay | Attending: Family Medicine | Admitting: Family Medicine

## 2014-06-04 ENCOUNTER — Emergency Department: Payer: Self-pay | Admitting: Emergency Medicine

## 2014-06-28 ENCOUNTER — Encounter
Admit: 2014-06-28 | Disposition: A | Discharge status: Home / Self Care | Payer: Self-pay | Attending: Family Medicine | Admitting: Family Medicine

## 2014-07-16 NOTE — Discharge Summary (Signed)
PATIENT NAME:  Candace [name], Candace [name] MR#:  [id number] DATE OF BIRTH:  09-23-1950  DATE OF ADMISSION:  01/23/2012 DATE OF DISCHARGE:  01/24/2012  ADMITTING PHYSICIAN: Dr. Dustin Flock DISCHARGING PHYSICIAN: Dr. Gladstone Lighter  PRIMARY CARE PHYSICIAN: [facility]   Ammon: None.   DISCHARGE DIAGNOSES:  1. Chest pain, likely musculoskeletal or angina secondary to elevated blood pressure.  2. History of coronary artery disease with single-vessel disease on medical management.  3. Malignant hypertension.  4. Type 2 diabetes mellitus.   DISCHARGE HOME MEDICATIONS:  1. Glipizide 10 mg p.o. daily.  2. Metformin 500 mg p.o. b.i.d.  3. Simvastatin 40 mg p.o. daily.  4. Protonix 40 mg p.o. b.i.d.  5. Tylenol PM 2 tablets at bedtime as needed for difficulty sleeping.  6. Januvia 50 mg p.o. daily.  7. Aspirin 81 mg p.o. daily.  8. Lisinopril 20 mg p.o. b.i.d.   9. Metoprolol 100 mg p.o. b.i.d.  10. Imdur 30 mg p.o. daily.  11. Trazodone 75 mg p.o. at bedtime. 12. Skelaxin 400 mg p.o. b.i.d. p.r.n. for muscular pain.   DISCHARGE DIET: Low sodium, ADA 1800 diet.   DISCHARGE ACTIVITY: As tolerated.   FOLLOWUP INSTRUCTIONS: Primary care physician follow up in 1 to 2 weeks.   LABORATORY, DIAGNOSTIC AND RADIOLOGICAL DATA: WBC 8.4, hemoglobin 13.2, hematocrit 39.1, platelet count 226.  Sodium 144, potassium 3.7, chloride 106, bicarbonate 30, BUN 11, creatinine 0.67, glucose 116, calcium 9.0. Hemoglobin A1c 6.8. LDL 102, HDL 57, triglycerides 100, total cholesterol 179. Troponin remained negative while in the hospital. CT of the head showing chronic changes without evidence of acute abnormality. CT chest with contrast showing prominent bilateral axillary lymph nodes without further evidence of any focal abnormalities. Urinalysis negative for any infection. She did have a [place] which was normal study. No evidence of ischemia or infarct, ejection fraction greater  than 65% with normal LV systolic function. There is likely left ventricular hypertrophy.   BRIEF HOSPITAL COURSE: Ms. Shellhammer is a 64 year old white female with past medical history significant for hypertension, diabetes, coronary artery disease, status post cardiac catheterization in May 2011 showing single-vessel mid circumflex coronary artery disease not amenable for any intervention, was being managed on medications comes back to ER for chest pain. Patient was here 2 to 3 weeks ago for chest pain which is more musculoskeletal in nature, seen by Dr. Rockey Situ during that admission and was discharged home without a stress test. Now she says her pain is different with some heaviness and radiation to left arm so she was monitored on telemetry. Her cardiac enzymes were negative. She did have a Lexiscan Myoview which was completely normal. However, patient's blood pressure was 199/121 when she initially came to the hospital with some headache and chest pains. Not sure if the elevated blood pressure triggered the chest pain or otherwise. Either way, patient's blood pressure was much better in the hospital. Since her Myoview was negative she is being discharged home with follow up with primary care physician. She does endorse some decreased appetite, difficulty sleeping so started on trazodone. She is also sent on some Skelaxin for her muscular pain. All her other home medications were being continued without any changes. Her course has been otherwise uneventful in the hospital.   Accelerated hypertension. She was on lisinopril and metoprolol and Isordil at home. Medications have been slightly adjusted with increased dose of lisinopril and also metoprolol and Isordil was changed to Imdur at the time of  discharge.    DISCHARGE CONDITION: Stable.   DISCHARGE DISPOSITION: Home.     TIME SPENT ON DISCHARGE: 45 minutes.   ____________________________ Gladstone Lighter, MD rk:cms D: 01/25/2012 14:13:29  ET T: 01/26/2012 11:55:12 ET JOB#: [id number]  cc: Gladstone Lighter, MD, <Dictator> [facility] Gladstone Lighter MD ELECTRONICALLY SIGNED 01/26/2012 14:15

## 2014-07-16 NOTE — Discharge Summary (Signed)
PATIENT NAME:  Candace [name], Candace [name] MR#:  [id number] DATE OF BIRTH:  [birth year]  DATE OF ADMISSION:  01/06/2012 DATE OF DISCHARGE:  01/06/2012  DISCHARGE DIAGNOSIS: Chest pain, likely noncardiac, very likely musculoskeletal as her chest wall was very tender on the right side on palpation, had negative serial cardiac enzymes times two.   SECONDARY DIAGNOSES:  1. Diabetes.  2. Hyperlipidemia.  3. Hypertension.  4. Coronary artery disease.  5. History of Schatzki's ring status post dilatation in 2011.   CONSULTATION: Cardiology, Dr. Rockey Situ   PROCEDURES/RADIOLOGY: Chest x-ray on 01/06/2012 showed no acute cardiopulmonary disease.   LABORATORY DATA: Urinalysis on admission was negative.   HISTORY AND SHORT HOSPITAL COURSE: The patient is a 64 year old female with the above-mentioned medical problems who was admitted for chest pain. She was ruled out with 2 negative sets of cardiac enzymes. Cardiology consultation was obtained with Dr. Rockey Situ who felt this pain to be musculoskeletal in nature and I completely agree with the same as the pain was reproducible on palpation. The patient was in agreement with the discharge plan and she was discharged home in stable condition.   VITAL SIGNS: On the date of discharge her vital signs are as follows: Temperature 97.9, heart rate 73 per minute, respirations 20 per minute, blood pressure 156/91 mmHg. She was saturating 96% on room air.   PERTINENT PHYSICAL EXAMINATION ON THE DATE OF DISCHARGE: CARDIOVASCULAR: S1, S2 normal. No murmurs, rubs, or gallop. LUNGS: Clear to auscultation bilaterally. No wheezing, rales, rhonchi, or crepitation. ABDOMEN: Soft, benign. NEUROLOGIC: Nonfocal examination. All other physical examination remained at baseline.   DISCHARGE MEDICATIONS:  1. Glipizide 10 mg p.o. daily.  2. Metformin 500 mg p.o. b.i.d.  3. Metoprolol 50 mg p.o. b.i.d.  4. Simvastatin 40 mg p.o. at bedtime.  5. Promethazine 25 mg p.o. t.i.d. as needed.   6. Protonix 40 mg p.o. b.i.d.  7. Isosorbide dinitrate 20 mg p.o., 1.5 tablets daily.  8. Lisinopril 10 mg p.o. b.i.d.  9. Tylenol 2 tablets p.o. at bedtime as needed.  10. Januvia 50 mg p.o. daily. 11. Ibuprofen 600 mg p.o. 3 times a day for seven days.   DISCHARGE DIET: Low sodium, 1800 ADA, low fat, low cholesterol.   DISCHARGE ACTIVITY: As tolerated.   DISCHARGE INSTRUCTIONS AND FOLLOWUP: The patient was instructed to follow up with her primary care physician, Dr. Juluis Pitch, in 1 to 2 weeks. She will need followup with Dr. Rockey Situ from cardiology in 4 to 6 weeks.   TOTAL TIME DISCHARGING THIS PATIENT: 45 minutes.    ____________________________ Lucina Mellow. Manuella Ghazi, MD vss:bjt D: 01/06/2012 19:30:54 ET T: 01/07/2012 12:58:06 ET JOB#: [id number]  cc: Calyb Mcquarrie S. Manuella Ghazi, MD, <Dictator> Youlanda Roys. Lovie Macadamia, MD Minna Merritts, MD Remer Macho MD ELECTRONICALLY SIGNED 01/09/2012 9:48

## 2014-07-16 NOTE — Consult Note (Signed)
General Aspect 64 year old female with significant past medical history of diabetes mellitus, hyperlipidemia, hypertension, and CAD, last cath 2011, who presents with complaints of chest pain. Cardiology was consulted for possible angina.  Chest pain started at 1:30 a.m., while at rest, sleeping.  described as sharp, midsternal, and nonradiating, worse with deep inspiration and reports positional relief. Worse with palpation, lower deiastinum, radiating a little to the left. The patient denies any shortness of breath, diaphoresis, sweating, dizziness, nausea, or palpitations. She denies any pain like this before.   In the ER, The patient's troponin were negative and her EKG did not show any significant changes.   prior cardiac catheterization done in May 2011 which did show a lesion in the LCX, small vessel. ,  recommended for medical management.  given 324 mg of aspirin in the Emergency Department.  "morphine made it feel a little better".   PAST MEDICAL HISTORY:  1. Diabetes.  2. Hyperlipidemia.  3. Hypertension.  4. Coronary artery disease.  5. Schatzki???s ring, which was dilated back in 2011.   PAST SURGICAL HISTORY:  1. Three cesarean sections.  2. Appendectomy.  3. Tubal ligation.    Present Illness . SOCIAL HISTORY: No smoking. No alcohol. No drug use. Lives at home.   FAMILY HISTORY: Father died at the age of 19 due to chronic obstructive pulmonary disease, history of lung cancer and heart disease. Mother died at the age of 15 of pancreatic cancer.   Physical Exam:   GEN well developed, well nourished, no acute distress, obese    HEENT red conjunctivae    NECK supple    RESP normal resp effort  clear BS    CARD Regular rate and rhythm  No murmur    ABD denies tenderness  soft    EXTR negative edema    SKIN normal to palpation    NEURO motor/sensory function intact    PSYCH alert, A+O to time, place, person, good insight   Review of Systems:    Subjective/Chief Complaint Chest pain    General: No Complaints    Skin: No Complaints    ENT: No Complaints    Eyes: No Complaints    Neck: No Complaints    Respiratory: No Complaints    Cardiovascular: Chest pain or discomfort    Gastrointestinal: No Complaints    Genitourinary: No Complaints    Vascular: No Complaints    Musculoskeletal: No Complaints    Neurologic: No Complaints    Hematologic: No Complaints    Endocrine: No Complaints    Psychiatric: No Complaints    Review of Systems: All other systems were reviewed and found to be negative    Medications/Allergies Reviewed Medications/Allergies reviewed     GERD - Esophageal Reflux:    CAD:    Diabetes:    HTN:    Hypercholesterolemia:    Hypertension:   Home Medications: Medication Instructions Status  Protonix 40 mg oral delayed release tablet 1 tab(s) orally 2 times a day Active  isosorbide dinitrate 20 mg oral tablet 1.5 tab(s) orally once a day Active  lisinopril 10 mg oral tablet 1 tab(s) orally 2 times a day Active  Tylenol PM 2 tab(s) orally once a day (at bedtime), As Needed- for Inability to Sleep  Active  Januvia 50 mg oral tablet 1 tab(s) orally once a day Active  glipiZIDE 10 mg oral tablet 1 tab(s) orally once a day (in the morning) Active  metformin 500 mg oral tablet 1 tab(s)  orally 2 times a day Active  metoprolol tartrate 50 mg oral tablet 1 tab(s) orally 2 times a day Active  simvastatin 40 mg oral tablet 1 tab(s) orally once a day (at bedtime) Active  promethazine 25 mg oral tablet 1 tab(s) orally 3 times a day, As Needed- for Nausea Active   Lab Results:  Hepatic:  10-Oct-13 04:02    Bilirubin, Total 0.4   Alkaline Phosphatase 122   SGPT (ALT) 19   SGOT (AST) 26   Total Protein, Serum 7.7   Albumin, Serum 4.0  Routine Chem:  10-Oct-13 04:02    Glucose, Serum  150   BUN 10   Creatinine (comp)  0.47   Sodium, Serum 143   Potassium, Serum 3.7   Chloride, Serum   108   CO2, Serum 24   Calcium (Total), Serum 8.9   Osmolality (calc) 287   eGFR (African American) >60   eGFR (Non-African American) >60 (eGFR values <24m/min/1.73 m2 may be an indication of chronic kidney disease (CKD). Calculated eGFR is useful in patients with stable renal function. The eGFR calculation will not be reliable in acutely ill patients when serum creatinine is changing rapidly. It is not useful in  patients on dialysis. The eGFR calculation may not be applicable to patients at the low and high extremes of body sizes, pregnant women, and vegetarians.)   Anion Gap 11  Cardiac:  10-Oct-13 04:02    CK, Total 119   CPK-MB, Serum  3.9 (Result(s) reported on 06 Jan 2012 at 04:29AM.)   Troponin I < 0.02 (0.00-0.05 0.05 ng/mL or less: NEGATIVE  Repeat testing in 3-6 hrs  if clinically indicated. >0.05 ng/mL: POTENTIAL  MYOCARDIAL INJURY. Repeat  testing in 3-6 hrs if  clinically indicated. NOTE: An increase or decrease  of 30% or more on serial  testing suggests a  clinically important change)    12:04    CK, Total 87   CPK-MB, Serum 3.2 (Result(s) reported on 06 Jan 2012 at 12:55PM.)   Troponin I < 0.02 (0.00-0.05 0.05 ng/mL or less: NEGATIVE  Repeat testing in 3-6 hrs  if clinically indicated. >0.05 ng/mL: POTENTIAL  MYOCARDIAL INJURY. Repeat  testing in 3-6 hrs if  clinically indicated. NOTE: An increase or decrease  of 30% or more on serial  testing suggests a  clinically important change)  Routine Hem:  10-Oct-13 04:02    WBC (CBC) 8.0   RBC (CBC) 4.13   Hemoglobin (CBC) 13.4   Hematocrit (CBC) 38.1   Platelet Count (CBC) 256   MCV 92   MCH 32.4   MCHC 35.0   RDW 14.0   Neutrophil % 43.4   Lymphocyte % 43.2   Monocyte % 8.0   Eosinophil % 4.0   Basophil % 1.4   Neutrophil # 3.5   Lymphocyte # 3.5   Monocyte # 0.6   Eosinophil # 0.3   Basophil # 0.1 (Result(s) reported on 06 Jan 2012 at 04:31AM.)   EKG:   Interpretation EKG shows NSR  with T wave ABN in III and AVF    No Known Allergies:   Vital Signs/Nurse's Notes: **Vital Signs.:   10-Oct-13 12:28   Vital Signs Type Routine   Temperature Temperature (F) 97.6   Celsius 36.4   Temperature Source oral   Pulse Pulse 67   Respirations Respirations 20   Systolic BP Systolic BP 1466  Diastolic BP (mmHg) Diastolic BP (mmHg) 82   Mean BP 113   Pulse  Ox % Pulse Ox % 97   Pulse Ox Activity Level  At rest   Oxygen Delivery Room Air/ 21 %     Impression 64 year old female with significant past medical history of diabetes mellitus, hyperlipidemia, hypertension, and CAD, last cath 2011, who presents with complaints of chest pain. Cardiology was consulted for possible angina.  1) Chest pain: atypical in nature reproducible with palpation, breathing, positional. Sounds musculoskeletal. Normal cardiac enz, No significant EKG changes --Would continue NSAIDS, pain medication No further cardiac testing needed at this time.  add asa to medications  2) CAD Would continue outpt meds, add asa Follow up with PMD.  3) diabetes: management per medical service.   Electronic Signatures: Ida Rogue (MD)  (Signed 10-Oct-13 14:10)  Authored: General Aspect/Present Illness, History and Physical Exam, Review of System, Past Medical History, Home Medications, Labs, EKG , Allergies, Vital Signs/Nurse's Notes, Impression/Plan   Last Updated: 10-Oct-13 14:10 by Ida Rogue (MD)

## 2014-07-16 NOTE — H&P (Signed)
PATIENT NAME:  Candace [name], CURTO MR#:  [id number] DATE OF BIRTH:  1950/08/24  DATE OF ADMISSION:  01/23/2012  PRIMARY CARE PHYSICIAN: [facility]  ED PHYSICIAN: Dr. Renard Hamper  CHIEF COMPLAINT: Chest pain.   HISTORY OF PRESENT ILLNESS: Patient is a 64 year old white female with history of known coronary artery disease based on her catheterization in May 2011 which showed a single vessel mid circumflex severe coronary artery disease deemed to be not PCI candidate based on the location who was hospitalized here on 01/06/2012 with chest pain. At that time she had significant reproducible component to her symptoms. Therefore, she was seen by cardiology and discharged home. Patient reports that she was taking Motrin as prescribed. Yesterday she reports that she ate some hot dogs and earlier around 10:00 to 10:30 she was reading a book when she developed a left-sided chest pressure, radiating to her left shoulder. She also had associated shortness of breath, became nauseous and was throwing up. Patient came to the ED. In the ER initially was noted to have a blood pressure of 199/121. Patient received a dose of labetalol with improvement in her blood pressure. Currently is 174/90. Patient reports that she has not recently had any chest pain since discharge from the hospital. She denies any dyspnea on exertion. She also reports that she had a headache earlier this morning which was frontal, now it is in back of her neck. She otherwise denies any lower extremity swelling, nocturnal dyspnea, orthopnea.   PAST MEDICAL HISTORY:  1. Diabetes.  2. Hyperlipidemia.  3. Hypertension.  4. Coronary artery disease with a cardiac catheterization done in May 2011 showing significant one vessel coronary artery disease circumflex and moderate mid LAD lesion. This was not felt to be a PCI candidate based on the location.  5. History of Schatzki's ring which was dilated in 2011.  6. Gastroesophageal reflux disease.   PAST  SURGICAL HISTORY:  1. Status post cesarean section x3.  2. Status post appendectomy. 3. Tibial ligation.   ALLERGIES: None.   CURRENT MEDICATIONS:  1. Aspirin 81, 1 tab p.o. daily.  2. Glipizide 10 mg daily.  3. Ibuprofen 600 1 tab p.o. t.i.d.  4. Isosorbide dinitrate 30 mg daily.  5. Januvia 50 daily.  6. Lisinopril 10, 1 tab p.o. b.i.d.  7. Metformin 500, 1 tab p.o. b.i.d.   8. Metoprolol tartrate 50, 1 tab p.o. b.i.d.  9. Protonix 40, 1 tab p.o. b.i.d.  10. Simvastatin 40 at bedtime.  11. Tylenol PM at bedtime as needed for sleep.   SOCIAL HISTORY: Does not smoke. No alcohol. No drug use. Lives at home.   FAMILY HISTORY: Father died age 15 due to chronic obstructive pulmonary disease, history of lung cancer and heart disease. Mother died at age 63 of pancreatic cancer.   REVIEW OF SYSTEMS: CONSTITUTIONAL: Denies any fevers, fatigue, weakness. EYES: No blurred or double vision. No pain. No redness. No inflammation. No glaucoma. No cataracts. ENT: No tinnitus. No ear pain. No hearing loss. No seasonal or year-round allergies. No difficulty with swallowing. RESPIRATORY: Denies any cough, wheezing, or hemoptysis. No chronic obstructive pulmonary disease. No tuberculosis. CARDIOVASCULAR: Chest pressure and pain as above. No orthopnea. No edema. No arrhythmia. No syncope. GASTROINTESTINAL: Did have some nausea and vomiting today. No diarrhea. No abdominal pain. No hematemesis. No melena. No changes in bowel habits. GENITOURINARY: Denies any dysuria, hematuria, renal calculus or frequency. ENDO: Denies any polydipsia, nocturia, or thyroid problems. HEME/LYMPH: Denies anemia, easy bruisability, or bleeding.  SKIN: No acne. No rash. No changes in mole, hair or skin. MUSCULOSKELETAL: Denies any pain in neck, back, or shoulder. NEURO: No numbness. No cerebrovascular accident. No transient ischemic attack. PSYCHIATRIC: Not anxious, depressed.   PHYSICAL EXAMINATION:  VITAL SIGNS: Temperature 98.1,  pulse 76, respirations 18, blood pressure 199/121 on presentation, subsequent blood pressure recently 174/90.   GENERAL: Patient is a morbidly obese African American female currently not in any acute distress.   HEENT: Head atraumatic, normocephalic. Pupils equally round, reactive to light and accommodation. Extraocular movements intact. There is no conjunctival pallor. No scleral icterus. Nasal exam shows no drainage or ulceration. Oropharynx is clear without any exudates.   NECK: No thyromegaly. No carotid bruits.   CARDIOVASCULAR: Regular rate and rhythm. No murmurs, rubs, clicks, or gallops. PMI is not displaced.   ABDOMEN: Soft, nontender, nondistended. Positive bowel sounds x4.   EXTREMITIES: No clubbing, cyanosis, edema.   SKIN: No rash.   LYMPHATICS: No lymph nodes palpable.   VASCULAR: Good DP, PT pulses.   PSYCHIATRIC: Not anxious or depressed.   NEUROLOGIC: Focal deficits. Cranial nerves II through XII grossly intact.   LABORATORY, DIAGNOSTIC AND RADIOLOGICAL DATA: CT scan of the head shows chronic changes without an acute abnormality. There is evidence of chronic lacunar infarcts along the anterior periphery of the left lateral ventricle. CT of the chest shows no evidence of PE. There is prominent bilateral axillary lymph nodes without further evidence of focal acute abnormalities. CPK 95. CK-MB 3.0. BMP: Glucose 111, BUN 11, creatinine 0.65, sodium 143, potassium 4.2, chloride 108, CO2 26. LFTs are normal. WBC 8.4, hemoglobin 13.2, platelet count 226. Troponin was less than 0.02. Urinalysis nitrites negative, leukocytes negative.   ASSESSMENT AND PLAN: Patient is a 64 year old African American female with history of coronary artery disease single vessel with also mid LAD lesion noted in 2011 presents to the ED with chest pain since this morning. Blood pressure initially was noted systolic blood pressure in the 230s.  1. Chest pressure, likely related to accelerated blood  pressure, also could be related to angina. At this time will check serial cardiac enzymes. Place her on aspirin. Due to recurrent symptoms and known coronary artery disease at this time will go ahead and get a Lexi MIBI. If it is significantly abnormal with evidence of significant ischemia then further evaluation and a cardiology consult will be needed. Will continue Imdur as taking at home as well as metoprolol as taking at home.  2. Accelerated hypertension. I will go ahead and increase her lisinopril from 10 b.i.d. to 20 b.i.d. which is maximum dose for lisinopril. Will go ahead and increase her metoprolol tartrate to 100 b.i.d. and will also had p.r.n. hydralazine.  3. Diabetes. Will continue Januvia and metformin.  4. Gastroesophageal reflux disease. Will continue PPIs. Will continue Protonix as taking b.i.d. at home.  TIME SPENT: 35 minutes.   ____________________________ Lafonda Mosses Posey Pronto, MD shp:cms D: 01/23/2012 14:25:59 ET T: 01/23/2012 14:40:09 ET JOB#: [id number]  cc: Cailean Heacock H. Posey Pronto, MD, <Dictator> [facility] Alric Seton MD ELECTRONICALLY SIGNED 01/23/2012 15:31

## 2014-07-16 NOTE — H&P (Signed)
PATIENT NAME:  Candace [name], Candace [name] MR#:  [id number] DATE OF BIRTH:  10-08-1950  DATE OF ADMISSION:  01/06/2012  PRIMARY CARE PHYSICIAN: [facility]  REFERRING PHYSICIAN: Dr. Marjean [name]  CHIEF COMPLAINT: Chest pain.   HISTORY OF PRESENT ILLNESS: This is a 64 year old female with significant past medical history of diabetes mellitus, hyperlipidemia, hypertension, and coronary artery disease who presents with complaints of chest pain. The patient reports the chest pain started at 1:30 a.m., started at rest. The patient describes the chest pain as sharp, midsternal, and nonradiating. The patient reports the chest pain is provoked by deep inspiration and reports positional relief when she is sitting up. It was as well reproducible by palpation on physical exam. The patient denies any shortness of breath, diaphoresis, sweating, dizziness, nausea, or palpitations. The patient's troponins were negative and her EKG did not show any significant changes. The patient had a cardiac catheterization done in May 2011 which did show a lesion, single-vessel mid circumflex, but it was not deemed to be a PCI candidate as it was only supplying a small area, and patient was recommended for medical management. The patient denies any coffee-ground emesis, bright red blood per rectum or melena. The patient was given 324 mg of aspirin in the Emergency Department.   PAST MEDICAL HISTORY:  1. Diabetes.  2. Hyperlipidemia.  3. Hypertension.  4. Coronary artery disease.  5. Schatzki's ring, which was dilated back in 2011.   PAST SURGICAL HISTORY:  1. Three cesarean sections.  2. Appendectomy.  3. Tubal ligation.   ALLERGIES: No known drug allergies.   HOME MEDICATIONS:  1. Tylenol P.M..  2. Simvastatin 40 at bedtime.  3. Protonix 40 mg daily.  4. Promethazine p.r.n.  5. Metoprolol 50 mg oral 2 times a day.  6. Metformin 500, 2 times a day.  7. Isosorbide dinitrate 30 mg daily.  8. Glipizide 10 mg daily.   9. Januvia 50 mg daily.   SOCIAL HISTORY: No smoking. No alcohol. No drug use. Lives at home.   FAMILY HISTORY: Father died at the age of 51 due to chronic obstructive pulmonary disease, history of lung cancer and heart disease. Mother died at the age of 28 of pancreatic cancer.   REVIEW OF SYSTEMS:  The patient denies any fever, fatigue, or weakness. EYES: Denies blurry vision, double vision, or pain. ENT: Denies tinnitus, ear pain, or hearing loss. RESPIRATORY: Denies cough, wheezing, hemoptysis, or dyspnea. CARDIOVASCULAR: Has complaints of chest pain worse upon palpation and deep inspiration. Relieved by sitting up.  Denies any orthopnea, edema, arrhythmia, or palpitations. GASTROINTESTINAL: Denies nausea, vomiting, diarrhea, abdominal pain, or hematemesis. GENITOURINARY: Denies dysuria, hematuria, or renal colic. ENDOCRINE: Denies polyuria, polydipsia, heat or cold intolerance. INTEGUMENT: Denies acne, rash, or skin lesions. NEURO: Denies numbness, weakness, dysarthria, epilepsy, tremors, vertigo, or ataxia. PSYCH: Denies anxiety, schizophrenia, insomnia, nervousness, or bipolar disorder.   PHYSICAL EXAMINATION:  VITAL SIGNS: Temperature 97.7, pulse 86, respiratory rate 18, blood pressure 144/80, saturating 96% on room air.   GENERAL: Well-nourished female sitting on a chair in no apparent distress.   HEENT: Head atraumatic, normocephalic. Pupils equal, reactive to light. Pink conjunctivae. Anicteric sclerae. Moist oral mucosa.   NECK: Supple. No thyromegaly. No JVD.   CHEST: Good air entry bilaterally. No wheezing, rales, or rhonchi. Has midsternal chest pain reproducible by palpation, reports this kind of pain resembles the pain which she presented with.   ABDOMEN: Soft, nontender, nondistended. Bowel sounds present.   EXTREMITIES: No edema. No  clubbing. No cyanosis.   PSYCHIATRIC: Appropriate affect. Awake, alert times three. Intact judgment and insight.   NEURO: Motor 5/5 in all  extremities. Cranial nerves grossly intact. Sensation symmetrical and intact.   SKIN: Normal skin turgor. Warm and dry.   LABORATORY DATA:  Glucose 150, BUN 10, creatinine 0.47, sodium 143, potassium 3.7, chloride 108, CO2 24, CK total 119, CK-MB 3.9. Troponin less than 0.02. White blood cells 8, hemoglobin 13.4, hematocrit 38.1, platelets 256. Urinalysis is negative.   ASSESSMENT AND PLAN:  1. Chest pain. The patient does not have any EKG changes. The first set of troponins is negative. The patient's pain appears to be musculoskeletal as it is reproducible by palpation and by deep inspiration, and relieved by sitting up, but the patient has a known history of coronary artery disease. She is already on beta blockers, statin, ACE inhibitor, and nitrate, but she is not on aspirin. She was already given 324 mg of aspirin in the ED and was instructed to continue with aspirin on discharge. We will cycle the patient's troponin, and if negative the patient can follow up with her primary care physician as an outpatient.  2. Coronary artery disease. As mentioned above, the patient is on optimal medical management and we will add aspirin on discharge.  3. Hyperlipidemia. Continue with statin.  4. Diabetes mellitus. Continue with home medication metformin, Januvia, and glipizide.  5. Hypertension. Acceptable. We will continue with home medication.  6. Deep vein thrombosis prophylaxis. Subcutaneous heparin.  7. GI prophylaxis: On PPI.  8. CODE STATUS: FULL CODE.       TOTAL TIME SPENT ON ADMISSION AND PATIENT CARE: 50 minutes.    ____________________________ Candace Patricia, MD dse:bjt D: 01/06/2012 07:45:43 ET T: 01/06/2012 09:01:24 ET JOB#: [id number]  cc: Candace Patricia, MD, <Dictator> [facility] Candace [name] Candace Husbands MD ELECTRONICALLY SIGNED 01/08/2012 0:15

## 2014-07-19 NOTE — Discharge Summary (Signed)
PATIENT NAME:  Candace [name], Candace [name] MR#:  [id number] DATE OF BIRTH:  1951-02-03  DATE OF ADMISSION:  04/30/2012 DATE OF DISCHARGE:  05/03/2012  PRIMARY CARE PHYSICIAN:  no  DISCHARGE DIAGNOSES:  Angina, coronary artery disease, hypertension, diabetes, gastroesophageal reflux disease.   PROCEDURE:  Cardiac cath.  CONDITION:  Stable.   CODE STATUS:  FULL CODE.   MEDICATIONS:  Glipizide 10 mg by mouth daily, metformin 500 mg by mouth twice daily, Zocor 40 mg by mouth bedtime, Tylenol P.M. 2 tablets once a day at bedtime needed for sleep, Januvia 50 mg by mouth daily, aspirin 81 mg by mouth daily, isosorbide mononitrate 30 mg by mouth daily, lisinopril 20 mg by mouth twice daily, Lopressor 100 mg by mouth twice daily, Protonix 40 mg by mouth twice daily, Percocet 5/325 mg by mouth tablets every 6 hours as needed for three days.   DIET:  Low sodium, low fat, low cholesterol, ADA diet.   ACTIVITY:  As tolerated.   FOLLOW-UP CARE:  Follow up PCP within 1 to 2 weeks.  Follow up with Dr. Humphrey Rolls on 05/08/2012.  The patient then needs to avoid lifting heavy things.   REASON FOR ADMISSION:  Chest pain.   HOSPITAL COURSE:  The patient is a 64 year old African American female with a history of CAD, has chest pain very frequently, presented to the ED with chest pain.  For detailed history and physical examination, please refer to the admission note dictated by Dr. Laurin Coder.  The patient was admitted for chest pain, unstable angina with management, hypertension.  The patient's blood pressure was more than 200/100.  After admission patient has been treated with morphine, nitro as needed, but still have chest pain which is constant.  Since the patient has a stress test last October, Dr. Laurin Coder requested a cardiology consult.  Dr. Lavera Guise evaluated patient, suggested a cardiac cath by Dr. Humphrey Rolls.  Dr. Humphrey Rolls did a cardiac cath which shows CAD, but regressed compared to last cardiac catheterization.  Please see the detailed  information in Dr. Laurelyn Sickle cardiac cath report.  Dr. Humphrey Rolls suggested medical management with aspirin, statin and the beta-blocker and follow up him as outpatient.  For hypertension, the patient has been treated with Lopressor, lisinopril, Imdur.  The patient's blood pressure jumped to 80s during hospitalization which is possibly due to hypertension medication and morphine, but the patient's blood pressure improved.    Today's vital signs, temperature 98.1, blood pressure 119/78, pulse of 76.  The patient is alert, awake, oriented, in no acute distress.  The patient is clinically stable, will be discharged to home today.    I discussed the patient's discharge plan with the patient and family member and the case manager.   TIME SPENT:  About 36 minutes.      ____________________________ Demetrios Loll, MD qc:ea D: 05/03/2012 17:11:54 ET T: 05/04/2012 06:01:45 ET JOB#: [id number]  cc: Demetrios Loll, MD, <Dictator> Demetrios Loll MD ELECTRONICALLY SIGNED 05/04/2012 18:39

## 2014-07-19 NOTE — H&P (Signed)
PATIENT NAME:  Candace [name], Candace [name] MR#:  [id number] DATE OF BIRTH:  05-08-1950  DATE OF ADMISSION:  04/30/2012  PRIMARY CARE PHYSICIAN: [facility]_ [facility].   REFERRING PHYSICIAN: Francene Castle, MD, in the ER.   CHIEF COMPLAINT: Chest pain.   HISTORY OF PRESENT ILLNESS: [name] is a nice 64 year old female who has history of well-known coronary artery disease. She has chest pain very frequently. She has had a catheterization in 07/2009 and at that moment she was found to have single base mid-circumflex with severe disease and blockage.  Unfortunately at that moment she was not able to have a stent due to the location of the obstruction. She also had mid-LAD moderate disease. The patient also has diabetes, hyperlipidemia and hypertension. She is a former smoker. She has GERD and Schatzki rings. The patient comes today with a history of chest pain that started Thursday, three days ago.  It woke her up in the middle of the night, 7/10 intensity. She took some medications, including ibuprofen, Tylenol, BC powders and the pain subsided a little bit down to 5/10.  She had association of a really bad headache at the moment and nausea. She thought that her blood pressure was really high, due to previous episodes, whenever she has high blood pressures, she had some chest tightness and headache. Whenever she checked her blood pressure it was high at 170s/90s. She had some decrease of the pain, but it has been on and off since Thursday.  Today at 3:00 a.m. in the morning, she woke up again in severe pain, 7 to 10/10 located in the middle of the chest, achy, sharp and piercing, with some type of pressure like some heavy object sitting on the top of her chest. The chest pain has radiated to the left shoulder and to the left arm. She also has left-sided carpal tunnel syndrome, but she states that the pain is a little bit different than whenever her carpal tunnel syndrome is exacerbated. She has had significant nausea  and at this moment she actually wants to vomit. She has not vomited yet. Her pain relief a little bit with some nitroglycerin earlier, but not significantly.  She had relieved with ibuprofen, Tylenol, BC powder the first night, but not yesterday. The patient has been found to have really elevated blood pressure 209/98, now and she continues to have pain.  It has not subsided by the fact that she has p.o. nitroglycerin, Imdur and her blood pressure medications. The patient states that she has been taking her blood pressure medications on a regular basis, all her medications in general for at least three weeks. She has had problems with compliance, due to not able to afford her medications in the past. Overall her work-up is negative, but her pain is significant and continues.   REVIEW OF SYSTEMS:  CONSTITUTIONAL: No fever, no fatigue. No weakness. No weight loss or weight gain.  EYES: No blurry vision. No double vision. No cataracts.  ENT: No tinnitus. No difficulty swallowing. No postnasal drip or upper respiratory infection symptoms.  RESPIRATORY: The patient has a mild dry cough. She does not have any wheezing. She has never been diagnosed with COPD, but she is a former smoker. No pneumonia. No dyspnea.  CARDIOVASCULAR: Positive chest pain. Negative orthopnea, no edema, no palpitations, no syncope.  GASTROINTESTINAL: Positive nausea.  No vomiting yet, no diarrhea. No abdominal pain. No rectal bleeding. No hemorrhoids. No hematemesis or melena. No change in her bowel habits.  GENITOURINARY: No dysuria  or hematuria, changes in frequency, but she just started having some suprapubic tenderness just within the last several hours.  ENDOCRINE: No polyuria, polydipsia or polyphagia. No cold or heat intolerance. Glucose seems to be well controlled at home.  HEMATOLOGIC/LYMPHATIC: No anemia, easy bruising or swollen glands. She had a CT scan of the chest done in October of last admission that showed significant  lymphadenopathy at the level of bilateral axilla.  This has not been followed, as there were no orders to repeat any imaging or do a mammogram. The patient had a mammogram two years ago. She has noticed a little bit of excoriation of her axilla.  SKIN: No rashes or lesions.  MUSCULOSKELETAL: No neck, back, shoulder or knee pain.  NEUROLOGICAL: No numbness, tingling. No ataxia. No CVA. No TIA.  PSYCHIATRIC: No insomnia. No schizophrenia. No nervousness.   PAST MEDICAL HISTORY:  1.  Coronary artery disease, well known disease of the circumflex, which is severe and moderate mid-LAD lesion.  2.  History of GERD, diabetes, hyperlipidemia, hypertension, Schatzki ring.  PAST SURGICAL HISTORY:  1.  Positive for a C-section x 3.  2.  Appendectomy.  3.  Bilateral tubal ligation.   ALLERGIES: No known drug allergies.   FAMILY HISTORY: Positive for COPD and lung cancer her dad.  He died from an MI. Her mother died from pancreatic cancer at the age of 30. Her sister had severe breast cancer.   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 She lives by herself. She smoked for 20 years, half pack a day, but she quit 10 years ago. She denies any alcohol or drug use.   CURRENT MEDICATIONS:  Aspirin 81 mg once daily; Simvastatin 40 mg every night; Protonix 40 mg twice daily; metoprolol 50 mg twice daily; metformin 500 mg twice daily; Glipizide 10 mg once daily; ibuprofen p.r.n.; isosorbide 30 mg once daily; Januvia 50 mg once daily; lisinopril 20 mg twice daily.   PHYSICAL EXAMINATION:  VITAL SIGNS: Blood pressure as high as it has been 209/98, heart rate 67, respiratory rate 18, temperature 98.7, 99% on room air.  GENERAL:  Alert, oriented x 3. No acute distress. No respiratory distress. Hemodynamically stable.  HEENT:  Pupils equal and reactive. Extraocular movements are intact. Mucosa is moist. No oral lesions. No oropharyngeal exudates. Anicteric  sclerae. Pink conjunctivae.  NECK: Supple. No JVD. No thyromegaly. No adenopathy. No carotid bruits. No masses. No rigidity.  CARDIOVASCULAR: Regular rate and rhythm. No murmurs, rubs, or gallops. No tenderness to palpation of anterior chest. No displacement of PMI.  LUNGS: Clear without any wheezing or crepitus. No use of accessory muscles.  BREASTS:  Shows multiple cystic lesions in both breasts. They are mobile.  They are not tender. No significant suspicious masses that I can tell right now. LYMPHATICS:   Lymphadenopathy of both axillary areas, mobile and nontender. No lymphadenopathy in neck or supraclavicular areas, epitrochlear or inguinal.  ABDOMEN: Soft, nontender, nondistended. No hepatosplenomegaly. No masses. Bowel sounds are positive. There is some mild tenderness in suprapubic area but no rebound.  GENITAL: Deferred.  EXTREMITIES: No edema, no cyanosis, no clubbing. Pulses +2, capillary refill 3 seconds.  MUSCULOSKELETAL: No significant joint abnormalities or effusions. PSYCHIATRIC:  Mood is normal without any signs of depression or agitation.  NEUROLOGIC: Cranial nerves II through XII intact.  SKIN: Without any rashes or petechiae.   DIAGNOSTIC DATA: Glucose 142, BUN 12, creatinine 0.65.  Electrolytes within normal limits. CPKMB  is 4.9, slightly elevated with a troponin of 0.02.  White blood cells 7.7, hemoglobin 13.6, hematocrit 40 and platelet count 246.   Chest x-ray showed no significant abnormalities. No consolidation or effusions.   EKG: Overall normal sinus rhythm. No ST depression or elevation. No significant LVH or chamber enlargement.   ASSESSMENT AND PLAN: This is a 64 year old female with history of coronary artery disease, hypertension, diabetes, hyperlipidemia, positive family history for coronary artery disease, former smoker.  1.  Chest pain/unstable angina. The patient has chest pain that has been going on for at least three days. She has well-known coronary  artery disease with lesions in the circumflex, which is a severe lesion and a moderate lesion at the level of the left anterior descending. The patient has risk factors for coronary artery disease and known coronary disease. She could have an unstable plaque. At this moment what I am going to do is so far her work-up is so far negative, but her pain has not gone away yet. The pain is very typical. There is no tenderness to palpation of the chest. There is no sign of pain coming from anywhere else or and possibly GI, although she had a gastrointestinal cocktail and did not relieve any of her pain. Due to all this, the patient is going to be anticoagulated with Lovenox 1 mg/kg, beta blocker, aspirin, statin and vasodilators, such as isosorbide and nitroglycerin. If the pain does not relief after morphine administer right now, I am going to have to put on nitroglycerin drip and send her to the Critical Care Unit for now.  I am making plans to send her to the telemetry unit and admit her as an observation. I am not going to review a repeat a Myoview scan, because it was normal in 12/2011.  2.  Continue serial troponins and Cardiology consultation  3.  Diabetes. Continue treatment with Januvia and metformin.  Insulin sliding scale added and a hemoglobin A1c check.  4.  Hyperlipidemia. Check lipid level. Continue Simvastatin.  5.  Hypertension. The patient had actually accelerated hypertension for what we are doing all her medications. She did not take them today.  I am adding on Labetalol and hydralazine p.r.n. IV. 6.  The patient is a full code.  6.  Gastrointestinal prophylaxis with PPI.        7.  Breast disease and lymphadenopathy of the axillary area.  I am going to order a mammogram for the patient to do on Monday.  8.  Other medical problems are stable.   TIME SPENT: I spent about 50 minutes with this admission.  ____________________________ Bastrop Sink, MD rsg:eg D: 04/30/2012  13:44:51 ET T: 04/30/2012 20:44:37 ET JOB#: [id number]  cc: Irvington Sink, MD, <Dictator> Karis Rilling America Brown MD ELECTRONICALLY SIGNED 05/09/2012 13:38

## 2014-07-19 NOTE — Consult Note (Signed)
PATIENT NAME:  Candace [name], Candace [name] MR#:  [id number] DATE OF BIRTH:  [birth year]  DATE OF CONSULTATION:  05/02/2012  REFERRING PHYSICIAN:   CONSULTING PHYSICIAN:  Dionisio David, MD  HISTORY OF PRESENT ILLNESS:  This is a 64 year old African American female with a past medical history of coronary artery disease, diabetes, hypertension, hypercholesterolemia, GI reflux who came into the hospital because of chest pain. The chest pain was sharp and pressure-type in the retrosternal area radiating to the left arm. She had several episodes this morning of chest pain and yesterday. She ruled out for myocardial infarction, but keeps having chest pain at rest. She was also admitted in October with chest pain and it was felt that the chest pain may be noncardiac and was sent home without any workup only to followup 2 weeks later with another episode of chest pain. She ruled out again and was sent home with musculoskeletal type of chest pain. She comes in again now complaining of sharp and pressure-type chest pain relieved to some extent with morphine and nitroglycerin, but lately she did get morphine because of low blood pressure.   PAST MEDICAL HISTORY:  History of coronary artery disease. She had a cardiac catheterization in March 2011 which showed 60% stenosis in the mid LAD and 70% stenosis in the mid circumflex which was too small to do stenting. Other past medical history is hypertension, hyperlipidemia, diabetes, she has also GI reflux.   SOCIAL HISTORY:  She is not recently smoking, but she is an ex-smoker.   FAMILY HISTORY:  Positive for pancreatic cancer, lung cancer, hypertension.   PHYSICAL EXAMINATION: GENERAL: She is alert and oriented x 3 and in no acute distress.  VITAL SIGNS: Blood pressure is 95/70, pulse 100, respirations 14, afebrile.  NECK: No JVD.  LUNGS: Clear.  HEART: Regular rate and rhythm. Normal S1, S2. No audible murmur.  ABDOMEN: Soft, nontender, positive bowel sounds.   EXTREMITIES: No pedal edema.   DIAGNOSTIC DATA:  EKG shows normal sinus rhythm, poor R-wave progression suggestive of old anteroseptal wall myocardial infarction, nonspecific ST-T changes. Cardiac enzymes are negative. BUN and creatinine are unremarkable.   ASSESSMENT:  Unstable angina with prior history of a single vessel high-grade lesion in the circumflex and moderate disease in the left anterior descending.   PLAN:  To proceed with cardiac catheterization.    ____________________________ Dionisio David, MD sak:si D: 05/02/2012 14:59:40 ET T: 05/02/2012 15:15:33 ET JOB#: [id number]  cc: Dionisio David, MD, <Dictator> Dionisio David MD ELECTRONICALLY SIGNED 05/15/2012 9:03

## 2014-07-19 NOTE — Consult Note (Signed)
PATIENT NAME:  Candace [name], Candace [name] MR#:  [id number] DATE OF BIRTH:  1950-08-07  DATE OF CONSULTATION:  04/30/2012  REFERRING PHYSICIAN:   CONSULTING PHYSICIAN:  Cletis Athens, MD  REASON FOR CONSULTATION:  Candace [name] was admitted to the hospital with chest pain across the chest off and on since Thursday.   HISTORY OF PRESENT ILLNESS:  The pain is nagging in character. Sometimes it become stabbing. It is very deep in the left shoulder.  She also complained of headache and nausea. The intensity of pain is 8/10. She was seen Emergency Room and admitted into the hospital to rule out myocardial infarction.   MEDICATIONS:  Aspirin 81 mg p.o. daily. Glipizide 10 mg p.o. daily. Isosorbide mononitrate 30 mg p.o. daily. Januvia 50 mg p.o. daily. Lisinopril 20 mg p.o. daily. Metformin 500 mg p.o. daily. Metoprolol 100 mg p.o. daily. Protonix 40 mg p.o. daily. Simvastatin 40 mg p.o. daily. Tylenol as needed.   PERSONAL AND SOCIAL HISTORY: The patient does not smoke, used to smoke in the past, does not drink alcohol. No history of drug abuse. Smokes a half pack per day for 20 years.   REVIEW OF SYSTEMS: He denies any history of fever, chills, complained of chest pain radiating to her shoulder.   The patient had a cardiac catheterization done in the past with 2-vessel coronary artery disease. Also had atypical chest pain reflux with reflux esophagitis.  She complained of some nausea, no vomiting. No trouble passing water. No history of swelling of the legs.   OTHER PAST MEDICAL HISTORY:  Dyslipidemia, hypertension, diabetes, cardiac catheterization done in 2011, which showed disease in the circumflex and LAD.   PAST SURGICAL HISTORY: Cesarean section x 3   PHYSICAL EXAMINATION: GENERAL: The patient is a well-nourished, well-nourished female in no acute distress at the time of examination.  VITAL SIGNS: Blood pressure was found to 146/86.   HEENT: Head is normocephalic. Pupils reactive. Sclerae anicteric.  Tongue is moist, papillated.  NECK: Supple. Jugular venous pressure is not elevated. Carotid upstroke is 2+ without any bruit. There is no goiter.  No lymphadenopathy. No trachea is central.  CARDIOVASCULAR: Apical impulse is not palpable. Both heart sounds are distant. No murmur is audible.  CHEST: Decreased breath sounds.  ABDOMEN: Soft, nontender, without any hepatosplenomegaly.  EXTREMITIES: There is no pedal edema. No calf tenderness.   IMPRESSION: Chest pain, rule out myocardial infarction.   LABORATORY AND DIAGNOSTIC DATA:  Blood sugar 142, chloride 109. Troponin 0.02 and 0.02,  Chest x-ray does not show any congestive heart failure.   RECOMMENDATIONS:  The patient came in to the hospital with chest pain radiating to the left shoulder, 8/10 in intensity. The patient is known to have 2-vessel coronary artery disease. She was admitted to rule out myocardial infarction Right now she is chest pain free on her present medication. Will get her evaluated for cardiac catheterization by Dr. Humphrey Rolls because he has seen her before. Will continue statin, ACE inhibitor, nitroglycerin patch, nitroglycerin 2% ointment 1 inch twice a day and Motrin as needed for chest pain. We will continue metoprolol 100 mg p.o. twice a day.     ____________________________ Cletis Athens, MD jm:ct D: 04/30/2012 18:22:42 ET T: 05/01/2012 07:26:30 ET JOB#: [id number]  cc: Cletis Athens, MD, <Dictator> Cletis Athens MD ELECTRONICALLY SIGNED 05/04/2012 18:08

## 2014-07-20 NOTE — Discharge Summary (Signed)
PATIENT NAME:  Candace [name], Candace [name] MR#:  [id number] DATE OF BIRTH:  1951/03/13  DATE OF ADMISSION:  09/15/2013 DATE OF DISCHARGE:  09/16/2013  For a detailed note, please take a look at the history and physical done on admission by Dr. Margaretmary Eddy.   DIAGNOSES AT DISCHARGE: As follows:  1. Nausea, vomiting, diarrhea secondary to a presumed gastroenteritis.  2. Urinary tract infection.  3. Diabetes.  4. Hypertension. 5. Acute renal failure.   DISCHARGE DIET:  This patient is being discharged on a low-sodium, low-fat, American Diabetic Association diet.   ACTIVITY: As tolerated.   FOLLOWUP:  With his primary care physician in the next 1-2 weeks.   DISCHARGE MEDICATIONS: Glipizide 10 mg daily, simvastatin 40 mg at bedtime, Imdur 30 mg daily, Janumet with his Januvia and metformin 500/50 one tab daily, Protonix 40 mg b.i.d., Protonix 25 mg 2 tabs b.i.d., HCTZ 25 mg daily, ciprofloxacin 250 mg BID x 4 days and Zofran 4 mg t.i.d. as needed for nausea or vomiting.   PERTINENT STUDIES DONE DURING THE HOSPITAL COURSE: Clostridium difficile toxin assay to be negative.   HOSPITAL COURSE: This is a 64 year old female who presents to the hospital with nausea, vomiting and diarrhea, and noted to be in acute renal failure.  1. Acute gastroenteritis. This was likely the cause of the patient's nausea, vomiting, and diarrhea. This is likely suspected to be viral in nature. Her Clostridium difficile assay was negative.  She was treated supportively with IV fluids, antiemetics, and antidiarrheals. The patient clinically has significantly improved. She is now tolerating a regular diet. She still has mild intermittent nausea but it has improved with some antiemetics. She is therefore being discharged home on some Zofran with close followup with her primary care physician as an outpatient. She is currently tolerating a regular diet.  2. Acute renal failure. The patient presented to the hospital with a creatinine of 2. This  was likely secondary to the nausea, vomiting, diarrhea, and volume loss. She was aggressively hydrated with IV fluids and her diuretics were held and her BUN and creatinine now are back to baseline. Diabetes. The patient was maintained on some glipizide and sliding scale insulin, but she will resume her Janumet and glipizide as stated upon discharge.  3. Hyperlipidemia. The patient was maintained on her simvastatin. She will resume that.  4. Urinary tract infection. The patient was empirically given some ceftriaxone. Her urine cultures are currently negative. She is being discharged on oral Cipro for a few more days for urinary tract infection.  5. Hypertension. The patient was maintained on her Imdur and metoprolol and she will resume that upon discharge.   CODE STATUS: The patient is a full code.   DISPOSITION: She is being discharged home.   TIME SPENT: 40 minutes.    ____________________________ Belia Heman. Verdell Carmine, MD vjs:dd D: 09/16/2013 13:43:58 ET T: 09/16/2013 18:11:19 ET JOB#: [id number]  cc: Belia Heman. Verdell Carmine, MD, <Dictator> Candace Leber MD ELECTRONICALLY SIGNED 09/25/2013 20:30

## 2014-07-20 NOTE — Consult Note (Signed)
Chief Complaint:  Subjective/Chief Complaint seen for atypical chest pain and luq pain.  Today feeling some better, denies n or abdominal pain.   VITAL SIGNS/ANCILLARY NOTES: **Vital Signs.:   19-Apr-15 11:35  Vital Signs Type Routine  Temperature Temperature (F) 97.9  Pulse Pulse 82  Respirations Respirations 18  Systolic BP Systolic BP 283  Diastolic BP (mmHg) Diastolic BP (mmHg) 68  Mean BP 87  Pulse Ox % Pulse Ox % 100  Pulse Ox Activity Level  At rest  Oxygen Delivery 2L   Brief Assessment:  Cardiac Regular   Respiratory clear BS   Gastrointestinal details normal Soft  Nondistended  No masses palpable  Bowel sounds normal  mild lower abdominal discomfort to palpation   Lab Results: Routine Sero:  18-Apr-15 12:06   Occult Blood, Feces NEGATIVE (Result(s) reported on 14 Jul 2013 at 12:43PM.)   Radiology Results: CT:    18-Apr-15 13:04, CT Abdomen and Pelvis Without Contrast  CT Abdomen and Pelvis Without Contrast   REASON FOR EXAM:    (1) epigastric pain, hypotension; (2) abdominal pain;      NOTE: Nursing to Give O  COMMENTS:       PROCEDURE: CT  - CT ABDOMEN AND PELVIS W0  - Jul 14 2013  1:04PM     CLINICAL DATA:  Chest pressure. Nausea and vomiting. Headache.  Epigastric pain.    EXAM:  CT ABDOMEN AND PELVIS WITHOUT CONTRAST    TECHNIQUE:  Multidetector CT imaging of the abdomen and pelvis was performed  following the standard protocol without intravenous contrast.  COMPARISON:  01/22/2013    FINDINGS:  Lower Chest: Clear lung bases. Normal heart size without pericardial  or pleural effusion. Right coronary artery atherosclerosis. Trace  left pleural thickening, similar.    Abdomen/Pelvis: Normal noncontrast appearance of the spleen,  stomach, pancreas, gallbladder, biliary tract, adrenal glands. Left  renal vascular calcifications. Hepatomegaly, 19 cm. Normal kidneys.  Advanced aortic and branch vessel atherosclerosis. Retroaortic left  renal vein.  No retroperitoneal or retrocrural adenopathy. Scattered  colonic diverticula. Normal terminal ileum. Appendix is not  visualized but there is no evidence of right lower quadrant  inflammation. Normal small bowel without abdominal ascites. A fat  containing right inguinal hernia. No pelvic adenopathy. Normal  urinary bladder. Dominant eccentric left uterine fundal fibroid, 7.3  cm. No adnexal mass or significant free fluid.    Bones/Musculoskeletal: No acute osseous abnormality. Multilevel  spondylosis.     IMPRESSION:  1.  No acute process in the abdomen or pelvis.  2. Age advanced coronary artery atherosclerosis. Recommend  assessment of coronary risk factors and consideration of medical  therapy.  3. Uterine fibroid  4. Hepatomegaly  Electronically Signed    By: Abigail Miyamoto M.D.    On: 07/14/2013 13:33         Verified By: Areta Haber, M.D.,   Assessment/Plan:  Assessment/Plan:  Assessment 1) atypical chest pain and luq pain in the setting of frequent nsaid use.   Plan 1) plans for stress test tomorrow noted.  Will plan for egd after as clinically feaqsible.  Will also need to have colonoscopy for screening to be done as an outpatient.   I have discussed the risks benefits and complicatiosnof egd to include not limited to bleedding infection perforation and sedation and she wishes to proceed.   Electronic Signatures: Loistine Simas (MD)  (Signed 19-Apr-15 12:42)  Authored: Chief Complaint, VITAL SIGNS/ANCILLARY NOTES, Brief Assessment, Lab Results, Radiology Results, Assessment/Plan  Last Updated: 19-Apr-15 12:42 by Loistine Simas (MD)

## 2014-07-20 NOTE — H&P (Signed)
PATIENT NAME:  Candace [name], Candace [name] MR#:  [id number] DATE OF BIRTH:  09-11-1950  DATE OF ADMISSION:  07/13/2013  PRIMARY CARE PHYSICIAN: [facility].   PRIMARY CARDIOLOGIST: Dr. Neoma Laming.   REFERRING PHYSICIAN: Dr. Joni Fears.   CHIEF COMPLAINT: Chest pain.   HISTORY OF PRESENT ILLNESS: Candace [name] is a 64 year old female with past medical history of coronary artery disease, nonobstructive on a recent heart catheterization from 04/2012, hypertension, hyperlipidemia, diabetes mellitus who presented to the emergency department with complaints of chest started since this evening, 5:30 p.m. The patient was sitting and watching TV, started to experience pain on the left parasternal area, 10/10 intensity, sharp in nature. This was associated with some lightheadedness as well as palpitations exacerbated by movement, taking a deep breath, no relieving factors. Concerning this, the patient is brought to the emergency department.   WORKUP IN THE EMERGENCY DEPARTMENT: EKG showed poor R wave progression in anterior and inferior leads. Initial set of cardiac enzymes are negative. The patient was started on nitroglycerin drip which caused the blood pressure to drop to 80s. The patient received 1 liter of IV fluids with improvement of the blood pressure to low 100. The patient continues to complain of pain and asking for his pain medication. Has mild cough with no productive sputum but had1 episode of vomiting.   PAST MEDICAL HISTORY:  1. Gastroesophageal reflux disease. 2. Coronary artery disease, nonobstructive per heart catheterization done 04/2012. 3. Diabetes mellitus, on oral medication.  4. Hypertension.  5. Hyperlipidemia.   ALLERGIES: Drug allergies.   HOME MEDICATIONS:  1. Aspirin 81 mg daily.  2. Glipizide 10 mg once a day.  3. Hydrochlorothiazide 12.5 mg daily.  4. Ibuprofen 400 mg every 6 hours as needed.  5. Imdur 1 tablet once a day. 6. Janumet 50/500 mg every once a day. 7.  Lisinopril 10 mg 2 times a day.  8. Metoprolol 50 mg 2 times a day.  9. Protonix 80 mg 2 times a day.  10. Simvastatin 40 mg daily.   SOCIAL HISTORY: Smoked heavily in the past, 1 to 1-1/2 packs a day, quit 8 years back. Denies drinking alcohol or using illicit drugs. Currently works in a [employer]. Lives by herself.   FAMILY HISTORY: Mother died of pancreatic cancer. Father died from lung cancer. Also history of diabetes mellitus and hypertension.   REVIEW OF SYSTEMS:  CONSTITUTIONAL: Denies any generalized weakness.  EYES: No change in vision. ENT: No change in hearing. RESPIRATORY: Has shortness of breath.  CARDIOVASCULAR: Has chest pain. GASTROINTESTINAL: Nausea, vomiting.  GENITOURINARY: No dysuria or hematuria. HEMATOLOGIC: No easy bruising or bleeding.  SKIN: No rash or lesions. MUSCULOSKELETAL: No joint pains and aches.  NEUROLOGIC: No weakness or numbness in any part of the body.   PHYSICAL EXAMINATION:  GENERAL: The patient is a well-built, well-nourished, age-appropriate female lying down in the bed, not in distress.  VITAL SIGNS: Temperature 98.4, pulse 83, blood pressure 93/52, respiratory rate of 18, oxygen saturation is 99% on room air.  HEENT: Head normocephalic, atraumatic. No scleral icterus. Conjunctivae normal. Pupils equal and react to light. Extraocular movements are intact. Mucous membranes moist. No pharyngeal erythema.  NECK: Supple. No lymphadenopathy. No JVD. No carotid bruit. No thyromegaly. CHEST: Has focal tenderness on the left paraspinal area, tenderness to palpation.  LUNGS: Bilaterally clear to auscultation.  HEART: S1, S2 regular. No murmurs are heard.  ABDOMEN: Bowel sounds present. Soft, nontender, nondistended. Could not appreciate hepatosplenomegaly.  EXTREMITIES:  No pedal edema. Pulses 2+.  SKIN: No rash or lesions.  MUSCULOSKELETAL: Good range of motion in all the extremities.  NEUROLOGIC: The patient is alert,  oriented to place, person and time. Cranial nerves II-XII intact. Motor 5/5 in upper and lower extremities.   LABORATORY DATA: CMP: Glucose 208, BUN 27, creatinine of 1.57, potassium of 3.3. Troponin less than 0.02. BNP 208.   Chest x-ray, one view, portable: No acute cardiopulmonary disease.   EKG 12-lead: Poor R wave progression in the anterior leads.   ASSESSMENT AND PLAN: Candace [name] is a 64 year old female who comes to the [facility] with complaints of chest pain.  1. Chest pain: This is a musculoskeletal pain, reproducible pain. Considering the patient's risk factors, admit the patient under observation. Cycle cardiac enzymes x 3. If negative, the patient could be discharged home. The patient had a left heart catheter done about a year back. The patient usually walks without having any chest pain, palpitations.  2. Hypertension, hold the blood pressure medications until blood pressure improves. This is secondary to nitroglycerin.  3. Diabetes mellitus. Will hold the metformin for now during the hospital stay.  4. Hypokalemia: Replace by mouth.  5. Keep the patient on deep vein thrombosis prophylaxis with Lovenox.   TIME SPENT: 45 minutes.    ____________________________ Monica Becton, MD pv:lt D: 07/13/2013 23:04:08 ET T: 07/13/2013 23:50:47 ET JOB#: [id number]  cc: Monica Becton, MD, <Dictator> Monica Becton MD ELECTRONICALLY SIGNED 07/15/2013 0:07

## 2014-07-20 NOTE — Consult Note (Signed)
Chief Complaint:  Subjective/Chief Complaint seen for epigastric and luq pain.  patient had stress test this am, awaiting result.  denies n/v mild lower abdominal discomfort,   VITAL SIGNS/ANCILLARY NOTES: **Vital Signs.:   20-Apr-15 07:59  Vital Signs Type Pre Medication  Temperature Temperature (F) 98.8  Temperature Source oral  Pulse Pulse 94  Respirations Respirations 18  Systolic BP Systolic BP 097  Diastolic BP (mmHg) Diastolic BP (mmHg) 93  Mean BP 113  Pulse Ox % Pulse Ox % 98  Pulse Ox Activity Level  At rest  Oxygen Delivery 2L   Brief Assessment:  Cardiac Regular   Respiratory clear BS   Gastrointestinal details normal Soft  Nontender  Nondistended  No masses palpable  Bowel sounds normal   Lab Results: Cardiology:  20-Apr-15 09:44   Protocol LEXISCAN  Max Work Load 10  Total Exercise Time 61  Max Diastolic BP 79  Max Heart Rate 126  Max Predicted Heart Rate 158  Reason For Termination per lexiscan protocol  ECG interpretation Confirmed by OVERREAD, NOT ([id number]), editor PEARSON, BARBARA ([id number]) on 07/16/2013 12:17:15 PM  Routine Chem:  20-Apr-15 04:30   Glucose, Serum  115  BUN  4  Creatinine (comp) 0.68  Sodium, Serum 140  Potassium, Serum 3.9  Chloride, Serum  110  CO2, Serum 26  Calcium (Total), Serum  8.4  Anion Gap  4  Osmolality (calc) 277  eGFR (African American) >60  eGFR (Non-African American) >60 (eGFR values <39m/min/1.73 m2 may be an indication of chronic kidney disease (CKD). Calculated eGFR is useful in patients with stable renal function. The eGFR calculation will not be reliable in acutely ill patients when serum creatinine is changing rapidly. It is not useful in  patients on dialysis. The eGFR calculation may not be applicable to patients at the low and high extremes of body sizes, pregnant women, and vegetarians.)  Routine Sero:  18-Apr-15 12:06   Occult Blood, Feces NEGATIVE (Result(s) reported on 14 Jul 2013 at 12:43PM.)  Routine  Hem:  17-Apr-15 21:21   Platelet Count (CBC) 271 (Result(s) reported on 13 Jul 2013 at 09:40PM.)  20-Apr-15 04:30   Hemoglobin (CBC)  11.2 (Result(s) reported on 16 Jul 2013 at 05:35AM.)   Radiology Results: Cardiology:    20-Apr-15 09:44, Stress Test For Nuclear  Protocol LEXISCAN  Max Work Load 10  Total Exercise Time 61  Max Diastolic BP 79  Max Heart Rate 126  Max Predicted Heart Rate 158  Reason For Termination   per lexiscan protocol  ECG interpretation   Confirmed by OVERREAD, NOT ([id number]), editor PEARSON, BARBARA ([id number] on 07/16/2013 12:17:15 PM  Stress Test   Nuclear Med:    20-Apr-15 10:31, NM MYOCARDIAL SCAN  NM MYOCARDIAL SCAN   REASON FOR EXAM:    chest pain  COMMENTS:       PROCEDURE: NM  - NM MYOCARDIAL SCAN  - Jul 16 2013 10:31AM     RESULT: Nuclear Cardiology Report    Patient Demographics         Name: GOMAH DEWALTSINGLETON       Study Date: 07/16/2013   Patient ID: [id number]                  Report Date: 07/16/2013          DOB: 3Feb 10, 1952Age: [age]years       Height:          Sex: F  Weight:  Accession #: [id number]                        Race: Black or African   American    Ordering Physician: MD PADMAJA VASIREDDY  Diagnosing Physician: [id number] Bruce Kowalski MD  Indications  The patient was imaged for the following indications:  Assessment of acute chest pain.    Clinical History  64 year old F with no known coronary artery disease.  Cardiac risk factors include: hypertension, diabetes, history of smoking,   hypercholesterolemia, age and History of Falls, Short of Breath,.  Images were obtained using Rest Tc-99m/stress Tc-99m 1 day protocol.    Procedure  The test was terminated due toEnd of Vasodilator Infusion. The heart   rate was 97 beats per minute at rest and increased to 126 beats at peak   exercise The resting blood pressure was 163/85 mm/Hg and the stress blood     pressure was 167/79mm/Hg.  Myocardial perfusion imaging  was performed following the injection of   13.06 MCi of 99mTc Sestamibi at 8:35 AM.  The patient was injected with 32.43 MCi of 99mTc Sestamibi at 9:46 AM for   the stress portion of the exam.    Stress Test Findings    The following table summarizes the findings:  +-------------+-------------------+-------------------+                 STRESS EKG DATA     REST EKG DATA     +-------------+-------------------+-------------------+  Test Status  Normal             Normal               +-------------+-------------------+-------------------+  Rhythm       Normal sinus rhythmNormal sinus rhythm  +-------------+-------------------+-------------------+  IV ConductionNormal             Normal               +-------------+-------------------+-------------------+  Arrhythmias                     None                 +-------------+-------------------+-------------------+  ST Response  Normal                                  +-------------+-------------------+-------------------+    Overall Impression  The overall study imaging quality was deemed to be good.  The left ventricular global function was normal.  This myocardial perfusion scan showed no evidence of pathology and has a   normal appearance. The stress to rest volume ratio is 1.23 for the left   ventricle.  Pharmacological myocardial perfusion study with no significant ischemia.  There is no artifact noted on this study.  No significant wall motion abnormality noted.  The estimated ejection fraction is 65%.  There are no EKG changes concerning for ischemia.  Overall, this is a Low risk scan.    Summary   1. No significant wall motion abnormality noted.   2. Overall, this is a Low risk scan.   3. Pharmacological myocardial perfusion study with no significant   ischemia.   4. The estimated ejection fraction is 65%.   5. The left ventricular global function was normal.   6. There are no EKG changes concerning for ischemia.   7.  There is no artifact noted on this   study.  Diagnosing Physician: [id number] Bruce Kowalski MD  Electronically signed at 12:28:35 PM on 07/16/2013    *** Final ***    IMPRESSION: .        Verified By: BRUCE J. KOWALSKI  ([facility]), M.D., MD   Assessment/Plan:  Assessment/Plan:  Assessment 1) atypical chest pain, epigstric pain in the settign of frequent nsaid use.   Plan 1) awaiting results of stress test and cardiology over-read.  egd today as clinically feasible.  I have discussed the risks benefits and complications to include not limited to bleeding infection perforation and sedation and she whises to proceed.  Further recs to follow.   Electronic Signatures: ,  (MD)  (Signed 20-Apr-15 12:53)  Authored: Chief Complaint, VITAL SIGNS/ANCILLARY NOTES, Brief Assessment, Lab Results, Radiology Results, Assessment/Plan   Last Updated: 20-Apr-15 12:53 by ,  (MD) 

## 2014-07-20 NOTE — H&P (Signed)
PATIENT NAME:  Candace [name], Candace [name] MR#:  [id number] DATE OF BIRTH:  [birth year]  DATE OF ADMISSION:  09/15/2013  PRIMARY CARE PHYSICIAN: Nonlocal.   REFERRING PHYSICIAN: ER physician Dr. Cheri Guppy.   CHIEF COMPLAINT: Nausea, vomiting, diarrhea and abdominal pain for the past four days; muscle pains.   HISTORY OF PRESENT ILLNESS: The patient is a 64 year old female with a past medical history of diabetes mellitus, hypertension, hyperlipidemia, coronary artery disease, and GERD, who is having nausea, vomiting, and diarrhea for the past 2 to 3 to four days. The patient is complaining of epigastric abdominal pain. She spiked a temperature of 100 degrees Fahrenheit on Wednesday, but following that she did not check her temperature. Also complaining of low back pain and frequent urination. Denies any blood in her urine, vomit or diarrhea. The patient is feeling weak and complaining of muscle aches. No sick contacts. No similar complaints in the past. In the ED, the patient was given 2 liters of IV fluids, and hospitalist team is called to admit the patient. During my examination, the patient is complaining of weakness, still complaining of nausea and she does not feel like eating. Her daughter-in-law is at bedside. No other complaints. Denies any chest pain or shortness of breath.   PAST MEDICAL HISTORY: Coronary artery disease, diabetes mellitus, hypertension, hyperlipidemia, GERD.   PAST SURGICAL HISTORY: Status post cardiac catheterization in February 2014.   ALLERGIES: No known drug allergies.   PSYCHOSOCIAL HISTORY: Lives at home, lives alone. Denies any history of smoking, alcohol or illicit drug usage.   FAMILY HISTORY: Diabetes and hypertension run in her in her family.   REVIEW OF SYSTEMS: CONSTITUTIONAL: Denies any fatigue. Complaining of generalized weakness and one episode of fever of 103 degrees Fahrenheit on Wednesday.  EYES: Denies blurry vision, double vision. EARS, NOSE, THROAT: Denies  epistaxis, discharge, tinnitus.  RESPIRATORY: Denies cough, chronic obstructive pulmonary disease.  CARDIOVASCULAR: No chest pain, palpitations.  GASTROINTESTINAL: Complaining of nausea, vomiting, diarrhea, but denies any hematemesis or melena. Complaining of epigastric abdominal pain.  GENITOURINARY: No dysuria or hematuria. Has been having frequent urination.  HEMATOLOGIC AND LYMPHATIC: No anemia, easy bruising, bleeding.  INTEGUMENTARY: No acne, rash, lesions.  MUSCULOSKELETAL: No joint pain in the neck and back. Complaining of generalized muscle aches.  NEUROLOGIC: Denies any history of vertigo. Denies any transient ischemic attack or CVA.  PSYCHIATRIC: Denies any anxiety, depression.   HOME MEDICATIONS: Simvastatin 40 mg p.o. once daily, Protonix 40 mg once daily, metoprolol tartrate 20 mg 2 tablets p.o. b.i.d., Janumet 500/50, 1 tablet p.o. once daily, isosorbide mononitrate 1 tablet p.o. once daily, hydrochlorothiazide 25 mg 1 tablet p.o. once daily, glipizide 10 mg p.o. once daily.   PHYSICAL EXAMINATION: VITAL SIGNS: Temperature 98.3, pulse 76, respiration rate 18, blood pressure 113/76, oxygen saturation 99% GENERAL APPEARANCE: Not in acute distress. Moderately built and obese.  HEENT: Normocephalic, atraumatic. Pupils are equally reacting to light and accommodation. No scleral icterus. No conjunctival injection. No sinus tenderness. Extraocular movements are intact. Dry mucous membranes.   NECK: Supple. No JVD. No thyromegaly. Range of motion is intact.  LUNGS: Clear to auscultation bilaterally. No accessory muscle use. No anterior chest wall tenderness on palpation.  CARDIAC: S1, S2 normal. Regular rate and rhythm. No murmurs.  GASTROINTESTINAL: Soft, obese. Bowel sounds are positive in all four quadrants. Minimal epigastric tenderness is present. No rebound tenderness. No masses felt.  NEUROLOGIC: Awake, alert, oriented x3. Cranial nerves II through XII are intact. Motor and sensory  are  intact. Reflexes are 2+.  EXTREMITIES: No edema. No cyanosis. No clubbing.  SKIN: Warm to touch. Normal turgor. No rashes. No lesions.  MUSCULOSKELETAL: No joint effusion, tenderness, erythema.  PSYCHIATRIC: Normal mood and affect.   LABORATORY AND IMAGING STUDIES: Urinalysis: Cloudy in appearance, ketones trace, specific gravity 1.030, nitrates negative, leukocyte esterase 3+, hyaline casts are present. CBC is normal. Troponin less than 0.02. LFTs are normal. Chem-8: Glucose 144, BUN 33, creatinine 2.01. Sodium, potassium and chloride are normal. CO2 is normal . Anion gap, serum osmolality and calcium are normal. Lipase is normal. Urine culture is ordered, which is pending.   ASSESSMENT AND PLAN: a 64 year old female who came to the Emergency Room with a 3 to 4 day history of nausea, vomiting, diarrhea, associated with epigastric abdominal pain. Spiked a temperature of 103 degrees Fahrenheit on Wednesday. No other episodes.   ASSESSMENT AND PLAN: 1.  Acute gastroenteritis, probably viral, as the patient is complaining of weakness and muscle aches. We will provide her IV fluids and check stool for C. diff toxin, as well as her blood.  2.  Acute cystitis. Urine culture is ordered. The patient will be on IV Rocephin.  3.  Acute kidney injury, probably prerenal from dehydration. We will provide her IV fluids, another 2 liters, and monitor renal function closely, avoid nephrotoxins.  4.  History of diabetes mellitus. The patient is on clear liquid diet. We will hold off on Janumet; will resume glipizide if the patient starts eating.  5.  Hypertension. We will hold off on hydrochlorothiazide, her home medications, in view of acute kidney injury.  6.  Hyperlipidemia. Continue statin.  7.  Coronary artery disease. Currently, the patient is asymptomatic.  8.  Gastroesophageal reflux disease. We will the patient will be on ranitidine. Will provide gastrointestinal and deep vein thrombosis prophylaxis.    Plan of care discussed in detail with the patient. She is aware of the plan.   CODE STATUS: She is full code. Daughter is the medical power of attorney.   TOTAL TIME SPENT ON ADMISSION: 50 minutes.   ____________________________ Nicholes Mango, MD ag:cg D: 09/15/2013 02:30:33 ET T: 09/15/2013 02:52:38 ET JOB#: [id number]  cc: Nicholes Mango, MD, <Dictator> Nicholes Mango MD ELECTRONICALLY SIGNED 09/16/2013 7:27

## 2014-07-20 NOTE — Consult Note (Signed)
PATIENT NAME:  Candace [name], Candace [name] MR#:  [id number] DATE OF BIRTH:  1950/07/06  DATE OF CONSULTATION:  07/14/2013  REFERRING PHYSICIAN:  Loletha Grayer, MD CONSULTING PHYSICIAN:  Lollie Sails, MD  REASON FOR CONSULTATION: Epigastric pain.   HISTORY OF PRESENT ILLNESS: Ms. Lorence is a very pleasant 64 year old African American female who was admitted to the hospital yesterday with chest pain. She states that yesterday evening at about 5: 30 p.m. she began to have some lower chest pain that was initially very light, progressed to be sharp and pressure sensation. She initially thought it was indigestion as she has had similar in the past. This was, however, then followed with chills and then emesis. She is currently feeling some better than she did on admission. She has not had any further emesis today. She has, however, been having frequent nausea. She has been seen at the [facility] and has been given prescriptions there initially for p.r.n. promethazine, which was then changed to 4 times a day. She has had a recent course of bronchitis for which she underwent 2 courses of azithromycin. She denies any abdominal pain except she states when somebody touches it. Just to sit in a chair or eat or things like this, she does not have any abdominal pain. Her appetite has, however, been decreased. She usually only eats one meal a day. The pain to touch is in her left upper quadrant. She has been getting symptoms of being full fast and epigastric bloating. She has been taking Protonix 40 mg a day for about a year. She was seen at [facility] several years ago by GI, but did not have a colonoscopy. She has had 2 EGDs at [facility], the first being in March 2011, that for dysphagia and chest pain. At that time, she had the finding of a mild Schatzki ring that was dilated. She did have some duodenitis as well. I did not see where a H. pylori biopsy had been taken. She also had an EGD in June 2013 for odynophagia and  possible food bolus retained. She was found to have a normal esophagus, small hiatal hernia, normal duodenum, and some bilious gastric fluid. Again, there were no biopsies taken. She has been taking an 81 mg aspirin on a daily basis. She has a history also of taking Excedrin up to multiple doses a day, several times a week, as well as ibuprofen; these for headaches. She states she has also had a heart catheterization in the past. GI family history is negative for colorectal cancer, liver disease or ulcers; however, her mother had pancreatic cancer.   PAST MEDICAL HISTORY: 1. Coronary artery disease, nonobstructive per heart catheterization February 2014.  2. Gastroesophageal reflux.  3. Diabetes mellitus.  4. Hypertension.  5. Hyperlipidemia.   OUTPATIENT MEDICATIONS: Include: 1. Aspirin 81 mg daily. Note: Over-the-counter aspirin and NSAIDs as above. 2. Glipizide 10 mg once a day. 3. Hydrochlorothiazide 12.5 mg daily. 4. Ibuprofen 400 mg every 6 hours p.r.n. as needed for pain.  5. Isosorbide mononitrate 30 mg once a day. 6. Janumet 500 mg/50 mg once a day. 7. Lisinopril 10 mg twice a day. 8. Metoprolol 25 mg 2 tablets twice a day. 9. Protonix 40 mg, actually should be taken as twice a day. 10. Simvastatin 40 mg a day.   ALLERGIES: She has no known drug allergies.   REVIEW OF SYSTEMS: Ten systems reviewed per admission history and physical, agree with same.   PHYSICAL EXAMINATION: VITAL SIGNS: Temperature is  97.9, pulse 74, respirations 16, blood pressure 92/58, pulse oximetry 99%.  GENERAL: She is a 64 year old African American female, no acute distress.  HEENT: Normocephalic, atraumatic. Eyes are anicteric. Nose: Septum midline. No lesions. Oropharynx: No lesions.  NECK: Supple. No JVD.  HEART: Regular rate and rhythm.  LUNGS: Clear.  ABDOMEN: Soft. There is some mild tenderness to palpation in the left upper quadrant extending toward midline. Bowel sounds are positive and  normoactive. There is no rebound. It is not possible to palpate internal organs due to body habitus.  EXTREMITIES: No clubbing, cyanosis or edema.  NEUROLOGICAL: Cranial nerves II through XII grossly intact. Muscle strength bilaterally equal and symmetric. DTRs bilaterally equal and symmetric.   DIAGNOSTIC DATA: She had labs on admission to the hospital showing a BNP of 208. BUN 27, creatinine 1.57, sodium 137, potassium 3.3, chloride 103, bicarbonate 27. Her calcium 8.8. She has had cardiac enzymes x3 showing minimal elevations of the CPK-MB 6.4, 6.0, 5.9. She has had troponin I x3, less than 0.02. Her hemogram shows a white count of 9.4, hemoglobin and hematocrit 12.8/38.8, platelet count 271, MCV 95. She had an activated PTT of 30.2. Repeat BUN and creatinine this morning were 29/1.8, respectively.   She had a portable chest film on admission showing unremarkable. She had a CT scan of the abdomen and pelvis without contrast, this showing no acute process in the abdomen and pelvis. Age-advanced coronary artery atherosclerosis with a recommendation of assessment of coronary risk factors and consideration of medical therapy. Also, uterine fibroid as well as hepatomegaly. I did not see evaluation of the mesenterics; however, this was done without contrast.   ASSESSMENT: Left upper quadrant pain/atypical chest pain. The patient did have a cardiac catheterization within the past year, without evidence of significant clinical lesions. The patient has been taking multiple nonsteroidal anti-inflammatory drugs weekly and at times daily. She has, however, been taking a proton pump inhibitor on a regular basis as well. Review of her chart indicates no previous check for Helicobacter pylori and she is at risk for ulceration/gastritis.   RECOMMENDATION: 1. Hemoccult cards. The patient has never had a colonoscopy in the past as well.  2. Continue PPI 40 mg p.o. b.i.d. as you are. We will plan for EGD on Monday. I  have discussed the risks, benefits and complications of EGD to include, but not limited to, bleeding, infection, perforation, and the risk of sedation and patient wishes to proceed. It is of note, the patient said that when she was dilated for a complaint of dysphagia, it did not seem to really make much of a difference for her.   ____________________________ Lollie Sails, MD mus:dr D: 07/14/2013 20:48:46 ET T: 07/15/2013 02:02:17 ET JOB#: [id number]  cc: Lollie Sails, MD, <Dictator> [place] MD ELECTRONICALLY SIGNED 07/26/2013 11:35

## 2014-07-20 NOTE — Discharge Summary (Signed)
PATIENT NAME:  Candace [name], Candace [name] MR#:  [id number] DATE OF BIRTH:  Jul 21, 1950  DATE OF ADMISSION:  07/13/2013 DATE OF DISCHARGE:  07/17/2013  PRIMARY CARE PHYSICIAN:  At the [facility].  FINAL DIAGNOSES: 1.  Chest pain and epigastric pain.  2.  Hypotension with history of hypertension.  3.  Acute renal failure, which resolved.  4.  Diabetes.   MEDICATIONS ON DISCHARGE: Include glipizide 10 mg in the morning, simvastatin 40 mg at bedtime, Imdur 30 mg daily, Janumet 550 one tablet daily, metoprolol tartrate 25 mg 2 tablets twice a day, Protonix 40 mg twice a day for 2 weeks then cut back to 1 tablet daily, acetaminophen/hydrocodone 325/5 mg one tablet every 6 hours as needed for pain. Stop taking aspirin, lisinopril, ibuprofen and hydrochlorothiazide.   DIET: Low sodium diet, carbohydrate-controlled diet, regular consistency.   ACTIVITY: As tolerated.  FOLLOWUP:  With Dr. Gustavo Lah 2 weeks, 1 to 2 weeks [facility].  The patient admitted 07/13/2013. The patient came in with chest pain, was admitted initially as an observation. Cardiac enzymes were ordered. When I saw the patient, I believed that this was more GI in nature. The patient's pain was in the epigastric area. I ordered a CT scan of the abdomen and pelvis which was essentially negative and I asked GI to see the patient. Dr. Gustavo Lah saw the patient in consultation.  LABORATORY AND RADIOLOGICAL DATA DURING THE HOSPITAL COURSE: Included an EKG showed normal sinus rhythm, nonspecific ST and T wave changes. BNP 208, glucose 208, BUN 27, creatinine 1.57. Sodium 137, potassium 3.3, chloride 103, CO2 of 27, calcium 8.8. White blood cell count 9.4, H and H 12.8 and 38.8, platelet count of 271. Troponin negative. Chest x-ray negative. Next 2 troponins were negative. Lipase 272. Next, creatinine went up to 1.8, LDL 154, HDL 36, triglycerides 118. Occult blood was negative. Hemoglobin A1c was 8.3. CT scan of the abdomen and pelvis showed no  acute abdominal or pelvis pathology. The patient had a stress test that was a low risk scan, EF 65%. Echocardiogram showed an EF of 50% to 55%. Dr. Gustavo Lah performed an endoscopy in the evening of April 20 which showed normal esophagus, hiatal hernia, erosive gastritis which was biopsied.  HOSPITAL COURSE PER PROBLEM LIST:  1.  For the patient's chest pain and epigastric pain, I felt this was more gastrointestinal in nature. The patient did have gastritis on endoscopy. Chest pain was ruled out cardiology-wise with a negative stress test and cardiac enzymes that were negative. The patient will follow up with gastroenterology as outpatient. Recommended Protonix 40 mg twice a day for 2 weeks then cut back to daily. Will stop taking aspirin and ibuprofen at this point to give the stomach some time to heal.  2.  Hypotension with history of hypertension. The patient's blood pressure medications were held most of the hospital course. The last couple days, I had to restart the metoprolol and the Imdur.  Can restart the lisinopril as outpatient in followup appointment.  3.  Acute renal failure. This had resolved with IV fluid hydration and holding the lisinopril and hydrochlorothiazide. Creatinine now back to normal range. Creatinine did peak at 1.8, and upon discharge 0.68.  4.  Diabetes. Her diabetic medications were held during the entire hospital course secondary to being on liquid diet for most of the hospital course. The patient tolerated solid food on the day of discharge. Can restart glipizide and Janumet as outpatient.  TIME SPENT ON DISCHARGE:  35 minutes.    ____________________________ Tana Conch. Leslye Peer, MD rjw:ce D: 07/17/2013 15:13:29 ET T: 07/17/2013 17:35:41 ET JOB#: [id number]  cc: Tana Conch. Leslye Peer, MD, <Dictator> [facility] Lollie Sails, MD Marisue Brooklyn MD ELECTRONICALLY SIGNED 07/20/2013 17:03

## 2014-07-20 NOTE — Consult Note (Signed)
Brief Consult Note: Diagnosis: luq abdominal pain and atypical chest pain.   Patient was seen by consultant.   Consult note dictated.   Recommend further assessment or treatment.   Discussed with Attending MD.   Comments: Please see full GI consult [phone].   Patietn presenting to ER with atypical chest pain in the setting of multiple nsaid use weekly/daily.  frequent nausea for several months, taking ppi at home.  Patietn had cardiac cath 04/2012 without clinically significant lesions.  Continue bid ppi as you are, will plan for egd on monday.  I have discussed the trisks bemnefits adn complications of egd to include not limited to bleeding infection perforation and sedation and she wishes to proceed.  Following.  Electronic Signatures: Loistine Simas (MD)  (Signed 18-Apr-15 20:53)  Authored: Brief Consult Note   Last Updated: 18-Apr-15 20:53 by Loistine Simas (MD)

## 2014-07-20 NOTE — Consult Note (Signed)
PATIENT NAME:  Candace [name], Candace [name] MR#:  [id number] DATE OF BIRTH:  [birth year]  DATE OF CONSULTATION:  07/14/2013  REFERRING PHYSICIAN:   CONSULTING PHYSICIAN:  Pancho Rushing D. Aleyna Cueva, MD  PRIMARY CARE PHYSICIAN: The patient usually goes to [facility], also sees [facility].   INDICATION: Chest pain.   HISTORY OF PRESENT ILLNESS: The patient is a 64 year old African American female with coronary artery disease, which is moderate, had a recent heart catheterization on 02/14, hypertension, hyperlipidemia, diabetes, who presented to the Emergency Room with chest pain. The patient had been sitting, watching TV, started having chest pain, left arm pain, 10 out of 10 in intensity, sharp in nature, associated with some lightheadedness, palpitations, worse with movement and taking a deep breath, not relieved. Nothing she did helped it go away, so she finally came to the Emergency Room for evaluation. In the ER, EKG had nonspecific findings. She was given nitroglycerin and medicines for her blood pressure. Due to persistent complaints, she was admitted for further evaluation and care.   PAST MEDICAL HISTORY: GERD, coronary artery disease, diabetes, hypertension, hyperlipidemia.   ALLERGIES: None.   MEDICATIONS: Aspirin 81 mg a day, glipizide 10 a day, hydrochlorothiazide 12.5 mg a day, ibuprofen 400 every six hours p.r.n., Imdur 30 a day, Janumet 50/500 once a day, lisinopril 10, 3 times a day, metoprolol 50, 2 times a day, Protonix 80, 3 times a day, simvastatin 40 a day.   SOCIAL HISTORY: Smoked in the past, quit eight years ago. No alcohol consumption. 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 Lives by herself.   FAMILY HISTORY: Pancreatic cancer, lung cancer, diabetes, hypertension.   REVIEW OF SYSTEMS: No blackout spells or syncope. Denies nausea or vomiting. No fever, no chills, no sweats. No weight loss, no weight gain. No hemoptysis, hematemesis. Denies bright red blood per rectum. No  vision change or hearing change. Denies sputum production or cough.   PHYSICAL EXAMINATION: VITAL SIGNS: Blood pressure was 100/60, pulse 85, respiratory rate 16, afebrile.  HEENT: Normocephalic, atraumatic. Pupils equal and reactive to light.  NECK: Supple. No significant JVD, bruits or adenopathy.  LUNGS: Clear to auscultation and percussion. No significant wheezing, rhonchi, or rales.  HEART: Regular rate and rhythm. Positive bowel sounds. No rebound, guarding or tenderness.  EXTREMITIES:wnl  LABORATORY DATA: Glucose 208, BUN 27, creatinine 1.57, potassium 3.3. Troponin negative, BNP was around 200. Chest x-ray essentially negative. EKG: Normal sinus rhythm, poor R wave progression, rate of about 80, nonspecific findings.   ASSESSMENT: 1.  Chest pain.  2.  Moderate coronary artery disease.  3.  Hyperlipidemia.  4.  Hypertension.  5.  Chronic renal insufficiency.  6.  Obesity.  7.  Hypokalemia. 8.  Past history of smoking.    PLAN: 1.  Agree with admit, rule out for myocardial infarction. Continue further evaluation. Follow up troponins. Follow up EKGs. Keep on telemetry. Continue nitrates for pain. Consider further work-up, probably with a stress test. I do not necessarily recommend catheterization at this stage. 2.  For hypertension, continue blood pressure control. Will probably hold off on blood pressure medication, since she is relatively hypotensive at this point.  3.  Hyperlipidemia. Continue statin therapy. With the known coronary artery disease and hyperlipidemia, we will continue simvastatin therapy.  4.  For diabetes, continue Janumet and glipizide. Follow up sugars.  5.  Continue aspirin therapy.  6.  For reflux, continue Protonix.  7.  For chronic renal insufficiency, follow up renal function, consider referring to  a nephrologist. 8.  Continue current care. Again, we will probably proceed with a Myoview on Monday.  ____________________________ Loran Senters. Clayborn Bigness,  MD ddc:cg D: 07/16/2013 02:58:00 ET T: 07/16/2013 04:56:06 ET JOB#: [id number]  cc: Hayly Litsey D. Clayborn Bigness, MD, <Dictator> Yolonda Kida MD ELECTRONICALLY SIGNED 08/21/2013 13:51

## 2014-07-21 NOTE — H&P (Signed)
PATIENT NAME:  Candace [name], Candace [name] MR#:  [id number] DATE OF BIRTH:  [birth year]  DATE OF ADMISSION:  09/20/2011  PRIMARY CARE PHYSICIAN: [facility]   CHIEF COMPLAINT: Food caught in throat.   HISTORY OF PRESENT ILLNESS: This is a 64 year old female who was eating a peach. It went down but did not go down all the way. Part of it came back up. She tried to drink some water to push it down and the water came back up. She feels like she is going to vomit but can't, feels like the food is still caught there. Her head has been hurting for weeks. She does have abdominal pain and chest discomfort, pain on swallowing, pain with talking but she is able to swallow her own secretions. Last week she did have nausea, vomiting, and diarrhea and on Saturday was started on Cipro for a urinary tract infection. The ER physician called Dr. Candace Cruise of Gastroenterology. Dr. Candace Cruise recommended a medical admission because she is swallowing her own saliva so the endoscopy can wait until the a.m.   PAST MEDICAL HISTORY:  1. Diabetes. 2. Hyperlipidemia. 3. Hypertension. 4. Coronary artery disease. 5. Schatzki's ring, which was dilated back in 2011.   PAST SURGICAL HISTORY:  1. Three Cesarean sections.  2. Appendectomy.  3. Tubal ligation.   ALLERGIES: No known drug allergies.   MEDICATIONS:  1. Tylenol PM. 2. Simvastatin 40 mg at bedtime.  3. Protonix 40 mg daily.  4. Promethazine only p.r.n.  5. Metoprolol tartrate 50 mg twice a day. 6. Metformin 500 mg twice a day. 7. Isosorbide dinitrate 20 mg twice a day. 8. Glipizide 10 mg daily.  9. Cipro 500 mg twice a day.   SOCIAL HISTORY: No smoking. No alcohol. No drug use. Lives alone. Works part-time for [employer].   FAMILY HISTORY: Father died at 39 of chronic obstructive pulmonary disease, had lung cancer, heart issues. Mother died at 53 of pancreatic cancer.   REVIEW OF SYSTEMS: CONSTITUTIONAL: Positive for sweats. No fever. No chills. Positive for  weight loss which she is trying. Positive for fatigue. EYES: No eye issues. EARS, NOSE, MOUTH, AND THROAT: Positive for sore throat prior to food getting caught in her throat. She does have occasional dysphagia to both solids and liquids. CARDIOVASCULAR: Positive for chest pain. Positive for palpitations. RESPIRATORY: Occasional shortness of breath. No cough. No sputum. No hemoptysis. GASTROINTESTINAL: Positive for food caught in throat. Positive for nausea. Unable to vomit. Positive for abdominal pain. No diarrhea. No constipation. No bright red blood per rectum. GENITOURINARY: No burning on urination. No hematuria. MUSCULOSKELETAL: Positive for left-sided pain. INTEGUMENTARY: Positive for recent treatment for scabies, now with recurrence of rash and itching. NEUROLOGIC: No fainting or blackouts. PSYCHIATRIC: No anxiety or depression. ENDOCRINE: No thyroid problems. HEMATOLOGIC/LYMPHATIC: No anemia. No easy bruising or bleeding.   PHYSICAL EXAMINATION:   VITAL SIGNS: Temperature 97.7, pulse 85, respirations 18, blood pressure 164/89, pulse oximetry 98%.   GENERAL: No respiratory distress.   EYES: Conjunctivae and lids normal. Pupils equal, round, and reactive to light. Extraocular muscles intact. No nystagmus.   EARS, NOSE, MOUTH, AND THROAT: Tympanic membranes no erythema. Nasal mucosa no erythema. Throat no erythema. No exudate seen. Lips and gums no lesions.   NECK: No JVD. No bruits. No lymphadenopathy. Painful when palpating close to the trachea.   LYMPHATIC: No lymph nodes in the neck.   CARDIOVASCULAR: S1, S2 normal. No gallops, rubs, or murmurs heard. Carotid upstroke 2+ bilaterally. No bruits.  EXTREMITIES: Dorsalis pedis pulses 2+ bilaterally. No edema of the lower extremity.   RESPIRATORY: Lungs clear to auscultation. No use of accessory muscles to breathe. No rhonchi, rales, or wheeze heard.   ABDOMEN: Soft. Positive tenderness in the epigastric area. No organomegaly/splenomegaly.  Normoactive bowel sounds.   LYMPHATIC: No lymph nodes in the neck.   MUSCULOSKELETAL: No clubbing, edema, or cyanosis.   SKIN: Small little red spots with darker spots surrounding. No clear burrow seen.   NEUROLOGIC: Cranial nerves II through XII grossly intact. Deep tendon reflexes 2+ bilateral lower extremities.   PSYCHIATRIC: The patient is oriented to person, place, and time.   LABORATORY AND RADIOLOGICAL DATA: White blood cell count 6.6, hemoglobin and hematocrit 12.9 and 39.5, platelet count 196, glucose 426, BUN 11, creatinine 0.79, sodium 136, potassium 3.9, chloride 103, CO2 25, calcium 9.1.   EKG normal sinus rhythm, 73 beats per minute, no acute ST-T wave changes.   ASSESSMENT AND PLAN:  1. Abdominal pain, foreign body, history of Schatzki's ring. ER physician, Dr. Jasmine December, spoke with Dr. Candace Cruise who asked for medical admit. I discussed the case with Dr. Candace Cruise that I have never admitted a patient with a foreign body before. He stated since the patient was able to swallow saliva endoscopy can wait until the a.m. Will give IV morphine, IV Zofran, IV fluids. Hold all oral medications. Will give IV Protonix instead of omeprazole. If things change, Dr. Candace Cruise needs to be notified.  2. Diabetes. Will hold oral medications and put on sliding scale.  3. Hypertension. P.r.n. IV hydralazine.  4. Coronary artery disease. Unable to give any cardiac meds at this point.  5. Scabies. This is a recurrence. The family was treated and everybody got better. Hers has recurred with itching and small areas. No classic burrows. I will treat again with permethrin cream. Can consider ivermectin if this does not work or further treatment.   TIME SPENT ON ADMISSION: 50 minutes. Case discussed with the patient's family, Dr. Candace Cruise, and ER physician.   ____________________________ Tana Conch. Leslye Peer, MD rjw:drc D: 09/20/2011 22:23:18 ET T: 09/21/2011 07:07:21 ET JOB#: [id number]  cc: Tana Conch. Leslye Peer, MD,  <Dictator> [facility] [place] MD ELECTRONICALLY SIGNED 09/21/2011 16:31

## 2014-07-21 NOTE — Consult Note (Signed)
Full consult to follow. Ate peach around 4 PM yest. Some vomiting afterwards. Odynophagia since. Getting pain meds. Able to clear secretions. Partial esophageal obstruction vs trauma to esophagus from food bolus. Plan EGD today. THanks.  Electronic Signatures: Verdie Shire (MD)  (Signed on 25-Jun-13 08:20)  Authored  Last Updated: 25-Jun-13 08:20 by Verdie Shire (MD)

## 2014-07-21 NOTE — Consult Note (Signed)
EGD done. Food no longer present. NO signs of trauma in esophagus either. Low residue diet ordered. Can be discharged to home today. Can start PPI daily if pain continues. Thanks.  Electronic Signatures: Verdie Shire (MD)  (Signed on 25-Jun-13 13:56)  Authored  Last Updated: 25-Jun-13 13:56 by Verdie Shire (MD)

## 2014-07-21 NOTE — Consult Note (Signed)
PATIENT NAME:  Candace [name], Candace [name] MR#:  [id number] DATE OF BIRTH:  1950-05-11  DATE OF CONSULTATION:  09/21/2011  REFERRING PHYSICIAN:   CONSULTING PHYSICIAN:  Lupita Dawn. Ramla Hase, MD  REASON FOR REFERRAL: Possible food bolus.   HISTORY OF PRESENT ILLNESS: The patient is a 64 year old black female who was eating a peach yesterday afternoon. It felt like it was getting stuck in her throat. Some of it she vomited up. When she tried to drink some water afterwards the water came out as well. She started having some chest discomfort with odynophagia on swallowing liquids. She came to the Emergency Room for further evaluation. In the Emergency Room she has been able to drink water and keep her secretions down without trouble but anytime she eats she starts having pain as well.    As a result, she was admitted for observation.   She did have a bout of nausea, vomiting, and diarrhea and was treated for a urinary tract infection.   She does recall having something similar to that a couple of years ago for which she ended up having endoscopy and had Schatzki's ring which was dilated. After she was dilated the symptoms of dysphagia resolved until now.   PAST MEDICAL HISTORY:  1. Diabetes. 2. Hypertension. 3. Coronary artery disease.  4. Hyperlipidemia.   PAST SURGICAL HISTORY:  1. Cesarean section.  2. Appendectomy.  3. Tubal ligation.   ALLERGIES: She has no known drug allergies.   MEDICATIONS:  1. Zocor. 2. Protonix. 3. Reglan. 4. Metoprolol. 5. Tylenol. 6. Metformin. 7. Glipizide. 8. Isosorbide. 9. Cipro.   SOCIAL HISTORY: She does not smoke or drink.   FAMILY HISTORY: Notable for chronic obstructive pulmonary disease, heart disease, and pancreatic cancer.   REVIEW OF SYSTEMS: No fever or chills but she does have some sweats. She does complain of some fatigue. She has had weight loss which is intentional. There are no visual or hearing changes. She does have episodes of chest pains and  palpitations. She does have some occasional shortness of breath but no coughing. GI symptoms have been described already. There is no diarrhea, constipation, gross hematochezia, or melena. The rest of the review of symptoms is noncontributory.   PHYSICAL EXAMINATION:   GENERAL: The patient is in mild distress because of the chest discomfort after swallowing.   VITAL SIGNS: Stable.   HEENT: Normocephalic, atraumatic head. Throat was clear.   NECK: Supple.   CARDIAC: Regular rhythm and rate.   LUNGS: Clear bilaterally.   ABDOMEN: Normoactive bowel sounds, soft and nontender. There is no hepatomegaly. There are no palpable masses.   EXTREMITIES: No clubbing, cyanosis, or edema.   SKIN: Negative.   NEUROLOGICAL: Grossly intact.   LABORATORY, DIAGNOSTIC, AND RADIOLOGICAL DATA: White count 6.6, hemoglobin 12.5, hematocrit 39.5, glucose 426, BUN 11, creatinine 0.79, sodium 136.   EKG showed normal sinus rhythm without any ST-T changes.   ASSESSMENT AND PLAN: This is a patient with chest pain. She may still have some foreign body in her esophagus although there is no evidence of complete obstruction because she is able to drink liquids and keep her secretions down. Because she is still having some discomfort, we will plan an endoscopy later this morning. Sometimes the food will pass but there is trauma to the mucosal lining from the food bolus which can cause pain as well. If there is still a piece of fruit left, we will try to remove it endoscopically. Hopefully that will get rid of the pain  completely.   ____________________________ Lupita Dawn. Candace Cruise, MD pyo:drc D: 09/21/2011 10:27:00 ET T: 09/21/2011 10:58:33 ET JOB#: [id number]  cc: Lupita Dawn. Candace Cruise, MD, <Dictator> Lupita Dawn Marcelo Ickes MD ELECTRONICALLY SIGNED 09/22/2011 8:56

## 2014-07-21 NOTE — Discharge Summary (Signed)
PATIENT NAME:  Candace [name], CHANCELLOR MR#:  [id number] DATE OF BIRTH:  18-Jul-1950  DATE OF ADMISSION:  09/20/2011 DATE OF DISCHARGE:  09/21/2011  PRIMARY CARE PHYSICIAN: [facility]   GI DOCTOR: Dr. Candace Cruise    DISCHARGE DIAGNOSES:  1. Abdominal pain/possible foreign body, now improving status post EGD showing no chocked food. No signs of trauma in esophageal mucosa either. 2. Scabies. She was getting permethrin cream.   SECONDARY DIAGNOSES:  1. Diabetes.  2. Hyperlipidemia.  3. Hypertension.  4. Coronary artery disease.  5. Schatzki's ring.   CONSULTATION: GI, Dr. Candace Cruise   PROCEDURES/RADIOLOGY:  1. EGD on June 25th showed no abnormal finding. There was no chocked food.  2. Soft tissue neck x-ray on June 24th showed degenerative joint disease changes in the cervical spine.   MAJOR LABORATORY PANEL: Urine culture was negative on admission. Urinalysis on admission was negative.   HISTORY AND SHORT HOSPITAL COURSE: The patient is a 64 year old female with the above-mentioned medical problems who was admitted for possible foreign body impaction in esophageal mucosa. GI consultation was obtained with Dr. Candace Cruise. Please see Dr. Marshia Ly dictated history and physical for the details. She underwent EGD by Dr. Candace Cruise and was found to have no pathology and was doing much better. She tolerated her diet, did not have much pain, and was discharged home in stable condition.   On the date of discharge her vital signs are as follows: Temperature 98.6, heart rate 84 per minute, respirations 18 per minute, blood pressure 136/85 mmHg. She was saturating 94% on room air.   PERTINENT PHYSICAL EXAMINATION ON THE DATE OF DISCHARGE: CARDIOVASCULAR: S1, S2 normal. No murmurs, rubs, or gallops. ABDOMEN: Soft, benign. NEUROLOGIC: Nonfocal examination. All other physical examination remained at baseline.   DISCHARGE MEDICATIONS:  1. Protonix 40 mg p.o. b.i.d.  2. Glipizide 10 mg p.o. daily.  3. Metformin 500 mg p.o. b.i.d.   4. Lisinopril 10 mg p.o. daily.  5. Metoprolol 50 mg p.o. b.i.d.  6. Simvastatin 40 mg p.o. at bedtime.  7. Tylenol 2 tablets p.o. daily.  8. Isosorbide dinitrate 20 mg p.o. b.i.d.  9. Promethazine 25 mg p.o. t.i.d. as needed.  10. Ciprofloxacin 500 mg p.o. b.i.d. for seven days as prescribed before her admission.   DISCHARGE DIET: Low residue for the next two days. Eat light for the first meal.   DISCHARGE ACTIVITY: As tolerated.   DISCHARGE INSTRUCTIONS AND FOLLOW-UP:  1. The patient was instructed to follow-up with her primary care physician at [facility] in 1 to 2 weeks.  2. She will need follow-up with Dr. Candace Cruise in 2 to 4 weeks if needed.   TOTAL TIME DISCHARGING THIS PATIENT: 55 minutes.   ____________________________ Lucina Mellow. Manuella Ghazi, MD vss:drc D: 09/21/2011 16:43:59 ET T: 09/22/2011 12:13:17 ET JOB#: [id number]  cc: Arraya Buck S. Manuella Ghazi, MD, <Dictator>, [facility], Lupita Dawn. Candace Cruise, MD Remer Macho MD ELECTRONICALLY SIGNED 09/23/2011 8:18

## 2014-09-23 ENCOUNTER — Ambulatory Visit: Payer: Self-pay

## 2014-10-07 ENCOUNTER — Encounter: Payer: Self-pay | Admitting: *Deleted

## 2014-10-08 ENCOUNTER — Ambulatory Visit (INDEPENDENT_AMBULATORY_CARE_PROVIDER_SITE_OTHER): Payer: No Typology Code available for payment source | Admitting: Urology

## 2014-10-08 ENCOUNTER — Encounter: Payer: Self-pay | Admitting: Urology

## 2014-10-08 VITALS — BP 117/75 | HR 71 | Resp 18 | Ht 63.0 in | Wt 186.3 lb

## 2014-10-08 DIAGNOSIS — R312 Other microscopic hematuria: Secondary | ICD-10-CM

## 2014-10-08 DIAGNOSIS — R3129 Other microscopic hematuria: Secondary | ICD-10-CM

## 2014-10-08 LAB — MICROSCOPIC EXAMINATION: Bacteria, UA: NONE SEEN

## 2014-10-08 LAB — URINALYSIS, COMPLETE
Bilirubin, UA: NEGATIVE
Glucose, UA: NEGATIVE
Ketones, UA: NEGATIVE
Nitrite, UA: NEGATIVE
Specific Gravity, UA: >1.03 — ABNORMAL HIGH (ref 1.005–1.030)
Urobilinogen, Ur: 0.2 mg/dL (ref 0.2–1.0)
pH, UA: 5.5 (ref 5.0–7.5)

## 2014-10-08 LAB — BLADDER SCAN AMB NON-IMAGING

## 2014-10-08 NOTE — Progress Notes (Signed)
I have been asked to see the patient by Dr. [name] Raddle, for evaluation and management of microscopic hematuria.  History of present illness:  The patient presents today to discuss her suprapubic and inguinal pain with associated microscopic hematuria. Her symptoms began approximately 3 months ago. The point she was found to have microscopic hematuria according to the patient and started on antibodies. She then re-presented for repeat urinalysis which persistently demonstrated microscopic hematuria. She was given a second round of anabiotic's with no resolution of her symptoms.  Ultimately she has had 3 courses of antibodies which have not changed her symptoms significantly. She continues to have suprapubic pain. This pain is persistent. It is located in the inguinal region. It is constant in nature. She denies any progressive voiding symptoms including frequency, urgency, dysuria, or incontinence. She does state that she has difficult initiating her stream and feels as if she does not empty her bladder completely. She denies any constipation or neurological comorbidities.  Review of systems: A 12 point comprehensive review of systems was obtained and is negative unless otherwise stated in the history of present illness.  Patient Active Problem List   Diagnosis Date Noted  . Carotid artery narrowing 02/08/2014  . Chest pain 02/08/2014  . Combined fat and carbohydrate induced hyperlipemia 02/08/2014  . Atypical chest pain 02/07/2014  . Arteriosclerosis of coronary artery 02/07/2014  . Essential (primary) hypertension 02/07/2014  . Awareness of heartbeats 02/07/2014  . Abdominal pain 10/05/2013  . D (diarrhea) 10/05/2013  . Acid reflux 10/05/2013    No current outpatient prescriptions on file prior to visit.   No current facility-administered medications on file prior to visit.    Past Medical History  Diagnosis Date  . Reflux   . Anxiety   . Arrhythmia   . Arthritis   . Diabetes  mellitus without complication   . Hypertension   . Hyperlipidemia   . Stroke     Past Surgical History  Procedure Laterality Date  . Appendectomy    . Cesarean section    . Tubal ligation      History  Substance Use Topics  . Smoking status: Former Smoker    Quit date: 10/07/2001  . Smokeless tobacco: Not on file  . Alcohol Use: No    Family History  Problem Relation Age of Onset  . Cancer Father   . Hypertension Father   . Heart disease Father   . Cancer Mother   . Hypertension Mother   . Cancer Sister     PE: Filed Vitals:   10/08/14 1132  BP: 117/75  Pulse: 71  Resp: 18  Height: 5\' 3"  (1.6 m)  Weight: 186 lb 4.8 oz (84.505 kg)   Patient appears to be in no acute distress  patient is alert and oriented x3 Atraumatic normocephalic head No cervical or supraclavicular lymphadenopathy appreciated No increased work of breathing, no audible wheezes/rhonchi Regular sinus rhythm/rate Abdomen is soft, nontender, nondistended, no CVA or suprapubic tenderness The patient's vaginal exam demonstrates good support without any evidence of prolapse. She does have severe diffuse tenderness in the peri-vaginal tissues including the levator and I. She also has suprapubic tenderness on bimanual palpation.  Lower extremities are symmetric without appreciable edema Grossly neurologically intact No identifiable skin lesions  No results for input(s): WBC, HGB, HCT in the last 72 hours. No results for input(s): NA, K, CL, CO2, GLUCOSE, BUN, CREATININE, CALCIUM in the last 72 hours. No results for input(s): LABPT, INR in the last  72 hours. No results for input(s): LABURIN in the last 72 hours. Results for orders placed or performed in visit on 09/15/13  Urine culture     Status: None   Collection Time: 09/14/13  9:23 PM  Result Value Ref Range Status   Micro Text Report   Final       SOURCE: CLEAN CATCH    ORGANISM 1                >100,000 CFU/ML STREPTOCOCCUS  AGALACTIAE(GROUP B)   COMMENT                   WITH MIXED-BACTERIAL-ORGANISMS   ANTIBIOTIC                                                      Clostridium Difficile [facility])     Status: None   Collection Time: 09/15/13  2:48 AM  Result Value Ref Range Status   Micro Text Report   Final       C.DIFFICILE ANTIGEN       C.DIFFICILE G[facility] ANTIGEN : NEGATIVE   C.DIFFICILE TOXIN A/B     C.DIFFICILE TOXINS A AND/OR B: POSITIVE   PCR FOR TOXIGENIC C.DIFF  PCR FOR TOXIGENIC C.DIFFICILE : NEGATIVE   INTERPRETATION            No C. difficile bacteria present, gene encoding for toxin not detected. Toxin positive may be due to presence of C. sordellii, which produces toxins identical to C. difficile.    ANTIBIOTIC                                                      Stool culture     Status: None   Collection Time: 09/15/13  2:48 AM  Result Value Ref Range Status   Micro Text Report   Final       COMMENT                   NO SALMONELLA OR SHIGELLA ISOLATED   COMMENT                   NO PATHOGENIC E.COLI DETECTED   COMMENT                   NO CAMPYLOBACTER ANTIGEN DETECTED   ANTIBIOTIC                                                        Imaging: none  Imp:  The patient has no evidence of microscopic hematuria on today's urinalysis. I don't have any previous urinalyses to compare. As such, I would like to get the referring providers notes and urine analysis results prior to making any additional decisions. If in fact the patient only has dipstick positive hematuria, she does not need any additional workup for it. If she has microscopic hematuria as could firm by the microscopic analysis then additional workup would be indicated. As a results to the patient's  inguinal and suprapubic pain, the patient clearly has pelvic pain as result of muscle fatigue/inflammation. The is quite evident on her pelvic exam.  Recommendations:  I extended the patient the difference between dipstick positive  and microscopic hematuria. We will try to get the patient's referring providers records for further review. We also will have the patient return in 6 weeks for repeat urinalysis. If at that time she continues to have no evidence of microscopic hematuria we will not work it up any further. In terms of the patient's pelvic pain, I recommended that she follow-up with pelvic floor physical therapy, and I made this referral.  Cc: Harlan Stains, NP Ardis Hughs

## 2014-10-10 ENCOUNTER — Emergency Department: Payer: No Typology Code available for payment source

## 2014-10-10 ENCOUNTER — Emergency Department
Admission: EM | Admit: 2014-10-10 | Discharge: 2014-10-10 | Disposition: A | Discharge status: Home / Self Care | Payer: No Typology Code available for payment source | Attending: Emergency Medicine | Admitting: Emergency Medicine

## 2014-10-10 ENCOUNTER — Other Ambulatory Visit: Payer: Self-pay

## 2014-10-10 ENCOUNTER — Encounter: Payer: Self-pay | Admitting: Emergency Medicine

## 2014-10-10 DIAGNOSIS — I10 Essential (primary) hypertension: Secondary | ICD-10-CM | POA: Insufficient documentation

## 2014-10-10 DIAGNOSIS — Z87891 Personal history of nicotine dependence: Secondary | ICD-10-CM | POA: Diagnosis not present

## 2014-10-10 DIAGNOSIS — Z791 Long term (current) use of non-steroidal anti-inflammatories (NSAID): Secondary | ICD-10-CM | POA: Insufficient documentation

## 2014-10-10 DIAGNOSIS — Z79899 Other long term (current) drug therapy: Secondary | ICD-10-CM | POA: Diagnosis not present

## 2014-10-10 DIAGNOSIS — E119 Type 2 diabetes mellitus without complications: Secondary | ICD-10-CM | POA: Insufficient documentation

## 2014-10-10 DIAGNOSIS — Z7982 Long term (current) use of aspirin: Secondary | ICD-10-CM | POA: Diagnosis not present

## 2014-10-10 DIAGNOSIS — R55 Syncope and collapse: Secondary | ICD-10-CM | POA: Diagnosis not present

## 2014-10-10 DIAGNOSIS — R0789 Other chest pain: Secondary | ICD-10-CM | POA: Diagnosis not present

## 2014-10-10 DIAGNOSIS — R51 Headache: Secondary | ICD-10-CM | POA: Insufficient documentation

## 2014-10-10 DIAGNOSIS — R519 Headache, unspecified: Secondary | ICD-10-CM

## 2014-10-10 LAB — CBC
HCT: 37.7 % (ref 35.0–47.0)
Hemoglobin: 12.5 g/dL (ref 12.0–16.0)
MCH: 31.2 pg (ref 26.0–34.0)
MCHC: 33.2 g/dL (ref 32.0–36.0)
MCV: 93.9 fL (ref 80.0–100.0)
Platelets: 267 10*3/uL (ref 150–440)
RBC: 4.02 MIL/uL (ref 3.80–5.20)
RDW: 13 % (ref 11.5–14.5)
WBC: 8.7 10*3/uL (ref 3.6–11.0)

## 2014-10-10 LAB — BASIC METABOLIC PANEL
Anion gap: 13 (ref 5–15)
BUN: 16 mg/dL (ref 6–20)
CO2: 26 mmol/L (ref 22–32)
Calcium: 9.5 mg/dL (ref 8.9–10.3)
Chloride: 102 mmol/L (ref 101–111)
Creatinine, Ser: 0.85 mg/dL (ref 0.44–1.00)
GFR calc Af Amer: >60 mL/min (ref >60)
GFR calc non Af Amer: >60 mL/min (ref >60)
Glucose, Bld: 245 mg/dL — ABNORMAL HIGH (ref 65–99)
Potassium: 3.7 mmol/L (ref 3.5–5.1)
Sodium: 141 mmol/L (ref 135–145)

## 2014-10-10 LAB — TROPONIN I
Troponin I: <0.03 ng/mL (ref <0.031)
Troponin I: <0.03 ng/mL (ref <0.031)

## 2014-10-10 MED ORDER — PROCHLORPERAZINE MALEATE 10 MG PO TABS
10.0000 mg | ORAL_TABLET | Freq: Once | ORAL | Status: AC
  Administered 2014-10-10: 10 mg via ORAL
  Filled 2014-10-10: qty 1

## 2014-10-10 MED ORDER — KETOROLAC TROMETHAMINE 30 MG/ML IJ SOLN
30.0000 mg | Freq: Once | INTRAMUSCULAR | Status: AC
  Administered 2014-10-10: 30 mg via INTRAVENOUS
  Filled 2014-10-10: qty 1

## 2014-10-10 MED ORDER — ACETAMINOPHEN 500 MG PO TABS
1000.0000 mg | ORAL_TABLET | Freq: Once | ORAL | Status: AC
  Administered 2014-10-10: 1000 mg via ORAL
  Filled 2014-10-10: qty 2

## 2014-10-10 MED ORDER — METOPROLOL TARTRATE 100 MG PO TABS
100.0000 mg | ORAL_TABLET | Freq: Once | ORAL | Status: AC
  Administered 2014-10-10: 100 mg via ORAL

## 2014-10-10 MED ORDER — METOPROLOL TARTRATE 100 MG PO TABS
ORAL_TABLET | ORAL | Status: AC
  Administered 2014-10-10: 100 mg via ORAL
  Filled 2014-10-10: qty 1

## 2014-10-10 NOTE — ED Notes (Signed)
Patient transported to CT 
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
## 2014-10-10 NOTE — ED Notes (Signed)
Pt returned to Rm 24 from CT.

## 2014-10-10 NOTE — ED Notes (Signed)
MD at bedside. 

## 2014-10-10 NOTE — ED Notes (Signed)
Pt presents to ED with c/o left side chest pain, onset this afternoon. Pt reports that she had syncopal episode yesterday where she blacked out and fell for unknown time. Pt states she started being dizzy, headache then passed out yesterday; still c/o headache and dizziness. Pt alerts and oriented x4 at this time.
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
## 2014-10-25 DIAGNOSIS — R55 Syncope and collapse: Secondary | ICD-10-CM | POA: Insufficient documentation

## 2014-10-28 DIAGNOSIS — I10 Essential (primary) hypertension: Secondary | ICD-10-CM | POA: Insufficient documentation

## 2014-11-21 ENCOUNTER — Ambulatory Visit: Payer: No Typology Code available for payment source

## 2014-12-13 ENCOUNTER — Ambulatory Visit: Payer: No Typology Code available for payment source

## 2014-12-24 ENCOUNTER — Ambulatory Visit (INDEPENDENT_AMBULATORY_CARE_PROVIDER_SITE_OTHER): Payer: No Typology Code available for payment source | Admitting: Urology

## 2014-12-24 ENCOUNTER — Encounter: Payer: Self-pay | Admitting: Urology

## 2014-12-24 VITALS — BP 180/96 | HR 75 | Ht 63.0 in | Wt 188.4 lb

## 2014-12-24 DIAGNOSIS — R312 Other microscopic hematuria: Secondary | ICD-10-CM | POA: Diagnosis not present

## 2014-12-24 DIAGNOSIS — R102 Pelvic and perineal pain: Secondary | ICD-10-CM

## 2014-12-24 DIAGNOSIS — R3129 Other microscopic hematuria: Secondary | ICD-10-CM

## 2014-12-24 LAB — URINALYSIS, COMPLETE
Bilirubin, UA: NEGATIVE
Glucose, UA: NEGATIVE
Ketones, UA: NEGATIVE
Leukocytes, UA: NEGATIVE
Nitrite, UA: NEGATIVE
Specific Gravity, UA: 1.02 (ref 1.005–1.030)
Urobilinogen, Ur: 0.2 mg/dL (ref 0.2–1.0)
pH, UA: 7 (ref 5.0–7.5)

## 2014-12-24 LAB — MICROSCOPIC EXAMINATION: Bacteria, UA: NONE SEEN

## 2014-12-24 NOTE — Progress Notes (Signed)
12/24/2014 9:25 AM   Candace [name] Candace [name] 06-Oct-1950 [id number]  Referring provider: Arlis Porta, [address] [place], [place] [address]  Chief Complaint  Patient presents with  . Hematuria    6wk follow up    HPI:  1 - Pelvic Pain - pt with episode low pelvic pain 09/2014. Eval with exam only with some point muscle tenderness in levator area. PVR <124mL. No correlation with full / empty bladder. Bother now completely resolved.  2 - Hemoglobinuria - pt with + hemoglobin on urine dipstick x2, no RBC's in micro x2. No gross hematuria.  Today " Candace [name]" is seen i nf/u above. No gross hemauria or bother form pelvic pain.    PMH: Past Medical History  Diagnosis Date  . Reflux   . Anxiety   . Arrhythmia   . Arthritis   . Diabetes mellitus without complication   . Hypertension   . Hyperlipidemia   . Stroke     Surgical History: Past Surgical History  Procedure Laterality Date  . Appendectomy    . Cesarean section    . Tubal ligation      Home Medications:    Medication List       This list is accurate as of: 12/24/14  9:25 AM.  Always use your most recent med list.               acetaminophen 325 MG tablet  Commonly known as:  TYLENOL  Take 650 mg by mouth every 6 (six) hours as needed for mild pain or headache.     aspirin EC 81 MG tablet  Take 81 mg by mouth daily.     aspirin-acetaminophen-caffeine 818-563-14 MG per tablet  Commonly known as:  EXCEDRIN MIGRAINE  Take 2 tablets by mouth every 6 (six) hours as needed for headache.     glipiZIDE 10 MG tablet  Commonly known as:  GLUCOTROL  Take 5-10 mg by mouth 2 (two) times daily. Pt takes one tablet in the morning and one-half tablet at bedtime.     hydrochlorothiazide 12.5 MG capsule  Commonly known as:  MICROZIDE  Take 12.5 mg by mouth daily.     isosorbide mononitrate 30 MG 24 hr tablet  Commonly known as:  IMDUR  Take 30 mg by mouth daily.     lisinopril 20 MG tablet  Commonly known  as:  PRINIVIL,ZESTRIL  Take 20 mg by mouth daily.     loperamide 2 MG tablet  Commonly known as:  IMODIUM A-D  Take 2 mg by mouth 4 (four) times daily as needed for diarrhea or loose stools.     meloxicam 15 MG tablet  Commonly known as:  MOBIC  Take 15 mg by mouth every 8 (eight) hours as needed for pain.     metFORMIN 500 MG 24 hr tablet  Commonly known as:  GLUCOPHAGE-XR  Take 500 mg by mouth 2 (two) times daily.     metoprolol 100 MG tablet  Commonly known as:  LOPRESSOR  Take 100 mg by mouth 2 (two) times daily.     ondansetron 4 MG tablet  Commonly known as:  ZOFRAN  Take 4 mg by mouth every 8 (eight) hours as needed for nausea or vomiting.     pantoprazole 40 MG tablet  Commonly known as:  PROTONIX  Take 40 mg by mouth daily.     rosuvastatin 20 MG tablet  Commonly known as:  CRESTOR  Take by mouth.  sertraline 50 MG tablet  Commonly known as:  ZOLOFT  Take 50 mg by mouth daily.     TRADJENTA 5 MG Tabs tablet  Generic drug:  linagliptin  Take 5 mg by mouth daily.        Allergies: No Known Allergies  Family History: Family History  Problem Relation Age of Onset  . Cancer Father   . Hypertension Father   . Heart disease Father   . Cancer Mother   . Hypertension Mother   . Cancer Sister     Social History:  reports that she quit smoking about 13 years ago. Her smoking use included Cigarettes. She does not have any smokeless tobacco history on file. She reports that she does not drink alcohol. Her drug history is not on file.  ROS: UROLOGY Frequent Urination?: No Hard to postpone urination?: No Burning/pain with urination?: No Get up at night to urinate?: No Leakage of urine?: No Urine stream starts and stops?: No Trouble starting stream?: No Do you have to strain to urinate?: No Blood in urine?: No Urinary tract infection?: No Sexually transmitted disease?: No Injury to kidneys or bladder?: No Painful intercourse?: No Weak stream?:  No Currently pregnant?: No Vaginal bleeding?: No Last menstrual period?: n  Gastrointestinal Nausea?: No Vomiting?: No Indigestion/heartburn?: No Diarrhea?: No Constipation?: No  Constitutional Fever: No Night sweats?: No Weight loss?: No Fatigue?: No  Skin Skin rash/lesions?: No Itching?: No  Eyes Blurred vision?: No Double vision?: No  Ears/Nose/Throat Sore throat?: No Sinus problems?: No  Hematologic/Lymphatic Swollen glands?: No Easy bruising?: No  Cardiovascular Leg swelling?: No Chest pain?: No  Respiratory Cough?: No Shortness of breath?: No  Endocrine Excessive thirst?: No  Musculoskeletal Back pain?: No Joint pain?: No  Neurological Headaches?: No Dizziness?: No  Psychologic Depression?: No Anxiety?: No  Physical Exam: BP 180/96 mmHg  Pulse 75  Ht 5\' 3"  (1.6 m)  Wt 188 lb 6.4 oz (85.458 kg)  BMI 33.38 kg/m2  Constitutional:  Alert and oriented, No acute distress. HEENT: [place] AT, moist mucus membranes.  Trachea midline, no masses. Cardiovascular: No clubbing, cyanosis, or edema. Respiratory: Normal respiratory effort, no increased work of breathing. GI: Abdomen is soft, nontender, nondistended, no abdominal masses GU: No CVA tenderness. NO SP TTP Skin: No rashes, bruises or suspicious lesions. Lymph: No cervical or inguinal adenopathy. Neurologic: Grossly intact, no focal deficits, moving all 4 extremities. Psychiatric: Normal mood and affect.  Laboratory Data: Lab Results  Component Value Date   WBC 8.7 10/10/2014   HGB 12.5 10/10/2014   HCT 37.7 10/10/2014   MCV 93.9 10/10/2014   PLT 267 10/10/2014    Lab Results  Component Value Date   CREATININE 0.85 10/10/2014    No results found for: PSA  No results found for: TESTOSTERONE  Lab Results  Component Value Date   HGBA1C 8.3* 07/14/2013    Urinalysis    Component Value Date/Time   COLORURINE Amber 09/14/2013 2123   APPEARANCEUR Cloudy 09/14/2013 2123    LABSPEC 1.030 09/14/2013 2123   PHURINE 5.0 09/14/2013 2123   GLUCOSEU Negative 10/08/2014 1132   GLUCOSEU Negative 09/14/2013 2123   HGBUR Negative 09/14/2013 2123   BILIRUBINUR Negative 10/08/2014 1132   BILIRUBINUR Negative 09/14/2013 2123   KETONESUR Trace 09/14/2013 2123   PROTEINUR 30 mg/dL 09/14/2013 2123   NITRITE Negative 10/08/2014 1132   NITRITE Negative 09/14/2013 2123   LEUKOCYTESUR 1+* 10/08/2014 1132   LEUKOCYTESUR 3+ 09/14/2013 2123    Pertinent Imaging:  Assessment & Plan:    1 - Pelvic Pain - likley MSK and resolved. Reassuring GU exam and empties well. Continue prn NSAIDS.   2 - Hemoglobinuria - no RBC's on micro. I do not feel furhter eval warranted. Reinforced need for more detailed eval especially of RBC's present on future tests or gross / visible blood develops. She voiced understnaidng.    Return if symptoms worsen or fail to improve.  Alexis Frock, [facility] [address] [place],  [id number] [phone]

## 2015-01-18 ENCOUNTER — Emergency Department
Admission: EM | Admit: 2015-01-18 | Discharge: 2015-01-18 | Disposition: A | Discharge status: Home / Self Care | Payer: No Typology Code available for payment source | Attending: Emergency Medicine | Admitting: Emergency Medicine

## 2015-01-18 DIAGNOSIS — R109 Unspecified abdominal pain: Secondary | ICD-10-CM | POA: Insufficient documentation

## 2015-01-18 DIAGNOSIS — Z7982 Long term (current) use of aspirin: Secondary | ICD-10-CM | POA: Diagnosis not present

## 2015-01-18 DIAGNOSIS — Z79899 Other long term (current) drug therapy: Secondary | ICD-10-CM | POA: Insufficient documentation

## 2015-01-18 DIAGNOSIS — Z87891 Personal history of nicotine dependence: Secondary | ICD-10-CM | POA: Insufficient documentation

## 2015-01-18 DIAGNOSIS — R112 Nausea with vomiting, unspecified: Secondary | ICD-10-CM | POA: Diagnosis not present

## 2015-01-18 DIAGNOSIS — R197 Diarrhea, unspecified: Secondary | ICD-10-CM | POA: Insufficient documentation

## 2015-01-18 DIAGNOSIS — E119 Type 2 diabetes mellitus without complications: Secondary | ICD-10-CM | POA: Diagnosis not present

## 2015-01-18 DIAGNOSIS — I10 Essential (primary) hypertension: Secondary | ICD-10-CM | POA: Insufficient documentation

## 2015-01-18 LAB — CBC
HCT: 38.5 % (ref 35.0–47.0)
Hemoglobin: 12.8 g/dL (ref 12.0–16.0)
MCH: 31.9 pg (ref 26.0–34.0)
MCHC: 33.3 g/dL (ref 32.0–36.0)
MCV: 95.5 fL (ref 80.0–100.0)
Platelets: 228 10*3/uL (ref 150–440)
RBC: 4.03 MIL/uL (ref 3.80–5.20)
RDW: 13.2 % (ref 11.5–14.5)
WBC: 9.2 10*3/uL (ref 3.6–11.0)

## 2015-01-18 LAB — COMPREHENSIVE METABOLIC PANEL
ALT: 14 U/L (ref 14–54)
AST: 19 U/L (ref 15–41)
Albumin: 4.2 g/dL (ref 3.5–5.0)
Alkaline Phosphatase: 91 U/L (ref 38–126)
Anion gap: 7 (ref 5–15)
BUN: 12 mg/dL (ref 6–20)
CO2: 26 mmol/L (ref 22–32)
Calcium: 8.9 mg/dL (ref 8.9–10.3)
Chloride: 108 mmol/L (ref 101–111)
Creatinine, Ser: 0.7 mg/dL (ref 0.44–1.00)
GFR calc Af Amer: >60 mL/min (ref >60)
GFR calc non Af Amer: >60 mL/min (ref >60)
Glucose, Bld: 119 mg/dL — ABNORMAL HIGH (ref 65–99)
Potassium: 3.7 mmol/L (ref 3.5–5.1)
Sodium: 141 mmol/L (ref 135–145)
Total Bilirubin: 0.3 mg/dL (ref 0.3–1.2)
Total Protein: 7.2 g/dL (ref 6.5–8.1)

## 2015-01-18 MED ORDER — SODIUM CHLORIDE 0.9 % IV SOLN
1000.0000 mL | Freq: Once | INTRAVENOUS | Status: AC
  Administered 2015-01-18: 1000 mL via INTRAVENOUS

## 2015-01-18 MED ORDER — ONDANSETRON HCL 4 MG/2ML IJ SOLN
4.0000 mg | Freq: Once | INTRAMUSCULAR | Status: AC
  Administered 2015-01-18: 4 mg via INTRAVENOUS
  Filled 2015-01-18: qty 2

## 2015-01-18 MED ORDER — MORPHINE SULFATE (PF) 2 MG/ML IV SOLN
2.0000 mg | Freq: Once | INTRAVENOUS | Status: AC
  Administered 2015-01-18: 2 mg via INTRAVENOUS
  Filled 2015-01-18: qty 1

## 2015-01-18 NOTE — ED Notes (Signed)
Patient is unable to give a stool specimen at this time.

## 2015-01-18 NOTE — ED Notes (Signed)
Pt to ED reporting diarrhea and cental abd pain since Tuesday. Pt was seen by [facility] and dx with viral gastroenteritis and given Zofran, Lomotil, and Bentyl the patient reports no relief with these medication. Pt reports constant nausea and weakness but denies vomiting. Pt denies fever but reports cold chills for the past day . Pt verbalized having more than 12 bowel movements today.

## 2015-01-18 NOTE — Discharge Instructions (Signed)

## 2015-01-18 NOTE — ED Notes (Signed)
Patient still is unable to give a stool specimen.

## 2015-01-18 NOTE — ED Provider Notes (Signed)
[facility] Emergency Department Provider Note  ____________________________________________  Time seen:    5 PM  I have reviewed the triage vital signs and the nursing notes.   HISTORY  Chief Complaint Diarrhea and Abdominal Pain    HPI Candace [name] is a 64 y.o. female who presents with complaints of crampy abdominal pain that is moderate and diarrhea as well as nausea for partially 5 days now. Patient reports she is having more than 12 bowel movements per day. She reports she was seen at the [place] [facility] and was diagnosed with viral gastroenteritis but denies any improvement. She denies blood in her stool. She denies fevers or chills. No vomiting, no sick contacts. No travel or camping     Past Medical History  Diagnosis Date  . Reflux   . Anxiety   . Arrhythmia   . Arthritis   . Diabetes mellitus without complication   . Hypertension   . Hyperlipidemia   . Stroke     Patient Active Problem List   Diagnosis Date Noted  . Benign essential HTN 10/28/2014  . Episode of syncope 10/25/2014  . Carotid artery narrowing 02/08/2014  . Chest pain 02/08/2014  . Combined fat and carbohydrate induced hyperlipemia 02/08/2014  . Atypical chest pain 02/07/2014  . Arteriosclerosis of coronary artery 02/07/2014  . Essential (primary) hypertension 02/07/2014  . Awareness of heartbeats 02/07/2014  . Abdominal pain 10/05/2013  . D (diarrhea) 10/05/2013  . Acid reflux 10/05/2013    Past Surgical History  Procedure Laterality Date  . Appendectomy    . Cesarean section    . Tubal ligation      Current Outpatient Rx  Name  Route  Sig  Dispense  Refill  . acetaminophen (TYLENOL) 325 MG tablet   Oral   Take 650 mg by mouth every 6 (six) hours as needed for mild pain or headache.         Marland Kitchen aspirin EC 81 MG tablet   Oral   Take 81 mg by mouth daily.         Marland Kitchen aspirin-acetaminophen-caffeine (EXCEDRIN MIGRAINE) 250-250-65 MG per tablet   Oral  Take 2 tablets by mouth every 6 (six) hours as needed for headache.         Marland Kitchen glipiZIDE (GLUCOTROL) 10 MG tablet   Oral   Take 5-10 mg by mouth 2 (two) times daily. Pt takes one tablet in the morning and one-half tablet at bedtime.         . hydrochlorothiazide (MICROZIDE) 12.5 MG capsule   Oral   Take 12.5 mg by mouth daily.         . isosorbide mononitrate (IMDUR) 30 MG 24 hr tablet   Oral   Take 30 mg by mouth daily.         Marland Kitchen linagliptin (TRADJENTA) 5 MG TABS tablet   Oral   Take 5 mg by mouth daily.         Marland Kitchen lisinopril (PRINIVIL,ZESTRIL) 20 MG tablet   Oral   Take 20 mg by mouth daily.         Marland Kitchen loperamide (IMODIUM A-D) 2 MG tablet   Oral   Take 2 mg by mouth 4 (four) times daily as needed for diarrhea or loose stools.         . meloxicam (MOBIC) 15 MG tablet   Oral   Take 15 mg by mouth every 8 (eight) hours as needed for pain.         Marland Kitchen  metFORMIN (GLUCOPHAGE-XR) 500 MG 24 hr tablet   Oral   Take 500 mg by mouth 2 (two) times daily.         . metoprolol (LOPRESSOR) 100 MG tablet   Oral   Take 100 mg by mouth 2 (two) times daily.         . ondansetron (ZOFRAN) 4 MG tablet   Oral   Take 4 mg by mouth every 8 (eight) hours as needed for nausea or vomiting.         . pantoprazole (PROTONIX) 40 MG tablet   Oral   Take 40 mg by mouth daily.         . rosuvastatin (CRESTOR) 20 MG tablet   Oral   Take by mouth.         . sertraline (ZOLOFT) 50 MG tablet   Oral   Take 50 mg by mouth daily.           Allergies Review of patient's allergies indicates no known allergies.  Family History  Problem Relation Age of Onset  . Cancer Father   . Hypertension Father   . Heart disease Father   . Cancer Mother   . Hypertension Mother   . Cancer Sister     Social History Social History  Substance Use Topics  . Smoking status: Former Smoker    Types: Cigarettes    Quit date: 10/07/2001  . Smokeless tobacco: Not on file  . Alcohol  Use: No    Review of Systems  Constitutional: Negative for fever. Eyes: Negative for visual changes. ENT: Negative for sore throat Cardiovascular: Negative for chest pain. Respiratory: Negative for shortness of breath. Gastrointestinal: Positive for abdominal pain, nausea and diarrhea. Genitourinary: Negative for dysuria. Musculoskeletal: Negative for back pain. Skin: Negative for rash. Neurological: Negative for headaches or focal weakness Psychiatric: No anxiety    ____________________________________________   PHYSICAL EXAM:  VITAL SIGNS: ED Triage Vitals  Enc Vitals Group     BP 01/18/15 1628 197/99 mmHg     Pulse Rate 01/18/15 1628 65     Resp 01/18/15 1628 16     Temp 01/18/15 1628 98.2 F (36.8 C)     Temp Source 01/18/15 1628 Oral     SpO2 01/18/15 1628 98 %     Weight 01/18/15 1628 184 lb (83.462 kg)     Height 01/18/15 1628 5\' 3"  (1.6 m)     Head Cir --      Peak Flow --      Pain Score 01/18/15 1630 8     Pain Loc --      Pain Edu? --      Excl. in [place]? --      Constitutional: Alert and oriented. Well appearing and in no distress. Eyes: Conjunctivae are normal.  ENT   Head: Normocephalic and atraumatic.   Mouth/Throat: Mucous membranes are moist. Cardiovascular: Normal rate, regular rhythm. Normal and symmetric distal pulses are present in all extremities. No murmurs, rubs, or gallops. Respiratory: Normal respiratory effort without tachypnea nor retractions. Breath sounds are clear and equal bilaterally.  Gastrointestinal: Soft and non-tender in all quadrants. No distention. There is no CVA tenderness. Genitourinary: deferred Musculoskeletal: Nontender with normal range of motion in all extremities. No lower extremity tenderness nor edema. Neurologic:  Normal speech and language. No gross focal neurologic deficits are appreciated. Skin:  Skin is warm, dry and intact. No rash noted. Psychiatric: Mood and affect are normal. Patient exhibits  appropriate insight and  judgment.  ____________________________________________    LABS (pertinent positives/negatives)  Labs Reviewed  C DIFFICILE QUICK SCREEN W PCR REFLEX  STOOL CULTURE  CBC  COMPREHENSIVE METABOLIC PANEL  OVA + PARASITE EXAM    ____________________________________________   EKG  None  ____________________________________________    RADIOLOGY I have personally reviewed any xrays that were ordered on this patient: None  ____________________________________________   PROCEDURES  Procedure(s) performed: none  Critical Care performed: none  ____________________________________________   INITIAL IMPRESSION / ASSESSMENT AND PLAN / ED COURSE  Pertinent labs & imaging results that were available during my care of the patient were reviewed by me and considered in my medical decision making (see chart for details).  Patient overall well-appearing. No acute distress. Benign abdominal exam. We'll send stool culture, ova and parasite and C. difficile given frequent bowel movements. No recent antibiotic use, no history of C. difficile, does not work in [employer]. We will check labs, give IV fluids and reevaluate   ----------------------------------------- 7:33 PM on 01/18/2015 -----------------------------------------  Patient well-appearing and in no distress. She has been able to produce stool during her time in the emergency department. Labs are unremarkable. Benign abdominal exam. Recommend supportive care and PCP follow-up. Return precautions discussed ____________________________________________   FINAL CLINICAL IMPRESSION(S) / ED DIAGNOSES  Final diagnoses:  Non-intractable vomiting with nausea, vomiting of unspecified type  Diarrhea, unspecified type     Lavonia Drafts, MD 01/18/15 2124

## 2015-01-18 NOTE — ED Notes (Signed)
Patient used room commode, but was unable to give a stool sample at this time.

## 2015-03-12 ENCOUNTER — Other Ambulatory Visit: Payer: Self-pay | Admitting: Nurse Practitioner

## 2015-03-12 DIAGNOSIS — Z1231 Encounter for screening mammogram for malignant neoplasm of breast: Secondary | ICD-10-CM

## 2015-03-15 ENCOUNTER — Emergency Department
Admission: EM | Admit: 2015-03-15 | Discharge: 2015-03-15 | Disposition: A | Discharge status: Home / Self Care | Payer: No Typology Code available for payment source | Attending: Emergency Medicine | Admitting: Emergency Medicine

## 2015-03-15 ENCOUNTER — Emergency Department: Payer: No Typology Code available for payment source

## 2015-03-15 ENCOUNTER — Encounter: Payer: Self-pay | Admitting: *Deleted

## 2015-03-15 DIAGNOSIS — E119 Type 2 diabetes mellitus without complications: Secondary | ICD-10-CM | POA: Diagnosis not present

## 2015-03-15 DIAGNOSIS — Z7984 Long term (current) use of oral hypoglycemic drugs: Secondary | ICD-10-CM | POA: Insufficient documentation

## 2015-03-15 DIAGNOSIS — R000 Tachycardia, unspecified: Secondary | ICD-10-CM | POA: Diagnosis not present

## 2015-03-15 DIAGNOSIS — I10 Essential (primary) hypertension: Secondary | ICD-10-CM | POA: Insufficient documentation

## 2015-03-15 DIAGNOSIS — Z87891 Personal history of nicotine dependence: Secondary | ICD-10-CM | POA: Diagnosis not present

## 2015-03-15 DIAGNOSIS — Z79899 Other long term (current) drug therapy: Secondary | ICD-10-CM | POA: Insufficient documentation

## 2015-03-15 DIAGNOSIS — R69 Illness, unspecified: Secondary | ICD-10-CM

## 2015-03-15 DIAGNOSIS — R1084 Generalized abdominal pain: Secondary | ICD-10-CM | POA: Insufficient documentation

## 2015-03-15 DIAGNOSIS — Z7982 Long term (current) use of aspirin: Secondary | ICD-10-CM | POA: Diagnosis not present

## 2015-03-15 DIAGNOSIS — Z791 Long term (current) use of non-steroidal anti-inflammatories (NSAID): Secondary | ICD-10-CM | POA: Diagnosis not present

## 2015-03-15 DIAGNOSIS — J111 Influenza due to unidentified influenza virus with other respiratory manifestations: Secondary | ICD-10-CM | POA: Insufficient documentation

## 2015-03-15 DIAGNOSIS — E860 Dehydration: Secondary | ICD-10-CM

## 2015-03-15 DIAGNOSIS — R197 Diarrhea, unspecified: Secondary | ICD-10-CM | POA: Diagnosis present

## 2015-03-15 LAB — COMPREHENSIVE METABOLIC PANEL
ALT: 18 U/L (ref 14–54)
AST: 21 U/L (ref 15–41)
Albumin: 4.4 g/dL (ref 3.5–5.0)
Alkaline Phosphatase: 118 U/L (ref 38–126)
Anion gap: 11 (ref 5–15)
BUN: 21 mg/dL — ABNORMAL HIGH (ref 6–20)
CO2: 28 mmol/L (ref 22–32)
Calcium: 9.3 mg/dL (ref 8.9–10.3)
Chloride: 101 mmol/L (ref 101–111)
Creatinine, Ser: 0.75 mg/dL (ref 0.44–1.00)
GFR calc Af Amer: >60 mL/min (ref >60)
GFR calc non Af Amer: >60 mL/min (ref >60)
Glucose, Bld: 317 mg/dL — ABNORMAL HIGH (ref 65–99)
Potassium: 4.1 mmol/L (ref 3.5–5.1)
Sodium: 140 mmol/L (ref 135–145)
Total Bilirubin: 0.9 mg/dL (ref 0.3–1.2)
Total Protein: 7.9 g/dL (ref 6.5–8.1)

## 2015-03-15 LAB — URINALYSIS COMPLETE WITH MICROSCOPIC (ARMC ONLY)
Bilirubin Urine: NEGATIVE
Glucose, UA: 150 mg/dL — AB
Hgb urine dipstick: NEGATIVE
Nitrite: NEGATIVE
Protein, ur: 100 mg/dL — AB
Specific Gravity, Urine: 1.029 (ref 1.005–1.030)
pH: 5 (ref 5.0–8.0)

## 2015-03-15 LAB — CBC
HCT: 44.4 % (ref 35.0–47.0)
Hemoglobin: 14.8 g/dL (ref 12.0–16.0)
MCH: 31.3 pg (ref 26.0–34.0)
MCHC: 33.3 g/dL (ref 32.0–36.0)
MCV: 94 fL (ref 80.0–100.0)
Platelets: 224 10*3/uL (ref 150–440)
RBC: 4.73 MIL/uL (ref 3.80–5.20)
RDW: 13.4 % (ref 11.5–14.5)
WBC: 7.7 10*3/uL (ref 3.6–11.0)

## 2015-03-15 LAB — TROPONIN I: Troponin I: <0.03 ng/mL (ref <0.031)

## 2015-03-15 LAB — LIPASE, BLOOD: Lipase: 31 U/L (ref 11–51)

## 2015-03-15 MED ORDER — PROMETHAZINE HCL 25 MG PO TABS: 25.0000 mg | ORAL_TABLET | Freq: Four times a day (QID) | ORAL | Status: DC | PRN

## 2015-03-15 MED ORDER — LOPERAMIDE HCL 2 MG PO TABS: 4.0000 mg | ORAL_TABLET | Freq: Four times a day (QID) | ORAL | Status: DC | PRN

## 2015-03-15 MED ORDER — ONDANSETRON 8 MG PO TBDP: 8.0000 mg | ORAL_TABLET | Freq: Three times a day (TID) | ORAL | Status: DC | PRN

## 2015-03-15 MED ORDER — ONDANSETRON HCL 4 MG/2ML IJ SOLN
4.0000 mg | Freq: Once | INTRAMUSCULAR | Status: AC
  Administered 2015-03-15: 4 mg via INTRAVENOUS
  Filled 2015-03-15: qty 2

## 2015-03-15 MED ORDER — METOCLOPRAMIDE HCL 5 MG/ML IJ SOLN
10.0000 mg | Freq: Once | INTRAMUSCULAR | Status: AC
  Administered 2015-03-15: 10 mg via INTRAVENOUS
  Filled 2015-03-15: qty 2

## 2015-03-15 MED ORDER — ONDANSETRON 4 MG PO TBDP
ORAL_TABLET | ORAL | Status: AC
  Filled 2015-03-15: qty 1

## 2015-03-15 MED ORDER — SODIUM CHLORIDE 0.9 % IV BOLUS (SEPSIS)
1000.0000 mL | Freq: Once | INTRAVENOUS | Status: AC
  Administered 2015-03-15: 1000 mL via INTRAVENOUS

## 2015-03-15 MED ORDER — ACETAMINOPHEN 325 MG PO TABS: 650.0000 mg | ORAL_TABLET | Freq: Four times a day (QID) | ORAL | Status: DC | PRN

## 2015-03-15 MED ORDER — ACETAMINOPHEN 500 MG PO TABS
1000.0000 mg | ORAL_TABLET | Freq: Once | ORAL | Status: AC
  Administered 2015-03-15: 1000 mg via ORAL
  Filled 2015-03-15: qty 2

## 2015-03-15 MED ORDER — ONDANSETRON 4 MG PO TBDP
4.0000 mg | ORAL_TABLET | Freq: Once | ORAL | Status: AC | PRN
  Administered 2015-03-15: 4 mg via ORAL

## 2015-03-15 NOTE — ED Notes (Signed)
Pt transported to xray 

## 2015-03-15 NOTE — ED Provider Notes (Signed)
[facility] Emergency Department Provider Note  ____________________________________________  Time seen: 2:25 PM  I have reviewed the triage vital signs and the nursing notes.   HISTORY  Chief Complaint Abdominal Pain; Nausea; and Diarrhea    HPI Candace [name] is a 64 y.o. female complains of runny nose cough congestion sore throat and body aches over the past week. She also has some vomiting and diarrhea that started this morning. She is tolerating oral intake today but feels nauseated. No fevers or chills     Past Medical History  Diagnosis Date  . Reflux   . Anxiety   . Arrhythmia   . Arthritis   . Diabetes mellitus without complication ([place])   . Hypertension   . Hyperlipidemia   . Stroke [facility])      Patient Active Problem List   Diagnosis Date Noted  . Benign essential HTN 10/28/2014  . Episode of syncope 10/25/2014  . Carotid artery narrowing 02/08/2014  . Chest pain 02/08/2014  . Combined fat and carbohydrate induced hyperlipemia 02/08/2014  . Atypical chest pain 02/07/2014  . Arteriosclerosis of coronary artery 02/07/2014  . Essential (primary) hypertension 02/07/2014  . Awareness of heartbeats 02/07/2014  . Abdominal pain 10/05/2013  . D (diarrhea) 10/05/2013  . Acid reflux 10/05/2013     Past Surgical History  Procedure Laterality Date  . Appendectomy    . Cesarean section    . Tubal ligation       Current Outpatient Rx  Name  Route  Sig  Dispense  Refill  . acetaminophen (TYLENOL) 325 MG tablet   Oral   Take 2 tablets (650 mg total) by mouth every 6 (six) hours as needed.   60 tablet   0   . aspirin EC 81 MG tablet   Oral   Take 81 mg by mouth daily.         Marland Kitchen aspirin-acetaminophen-caffeine (EXCEDRIN MIGRAINE) 250-250-65 MG per tablet   Oral   Take 2 tablets by mouth every 6 (six) hours as needed for headache.         Marland Kitchen glipiZIDE (GLUCOTROL) 10 MG tablet   Oral   Take 5-10 mg by mouth 2 (two) times  daily. Pt takes one tablet in the morning and one-half tablet at bedtime.         . hydrochlorothiazide (MICROZIDE) 12.5 MG capsule   Oral   Take 12.5 mg by mouth daily.         . isosorbide mononitrate (IMDUR) 30 MG 24 hr tablet   Oral   Take 30 mg by mouth daily.         Marland Kitchen linagliptin (TRADJENTA) 5 MG TABS tablet   Oral   Take 5 mg by mouth daily.         Marland Kitchen lisinopril (PRINIVIL,ZESTRIL) 20 MG tablet   Oral   Take 20 mg by mouth daily.         Marland Kitchen loperamide (IMODIUM A-D) 2 MG tablet   Oral   Take 2 tablets (4 mg total) by mouth 4 (four) times daily as needed for diarrhea or loose stools.   30 tablet   0   . meloxicam (MOBIC) 15 MG tablet   Oral   Take 15 mg by mouth every 8 (eight) hours as needed for pain.         . metFORMIN (GLUCOPHAGE-XR) 500 MG 24 hr tablet   Oral   Take 500 mg by mouth 2 (two) times daily.         Marland Kitchen  metoprolol (LOPRESSOR) 100 MG tablet   Oral   Take 100 mg by mouth 2 (two) times daily.         . ondansetron (ZOFRAN ODT) 8 MG disintegrating tablet   Oral   Take 1 tablet (8 mg total) by mouth every 8 (eight) hours as needed for nausea or vomiting.   20 tablet   0   . ondansetron (ZOFRAN) 4 MG tablet   Oral   Take 4 mg by mouth every 8 (eight) hours as needed for nausea or vomiting.         . pantoprazole (PROTONIX) 40 MG tablet   Oral   Take 40 mg by mouth daily.         . promethazine (PHENERGAN) 25 MG tablet   Oral   Take 1 tablet (25 mg total) by mouth every 6 (six) hours as needed for nausea or vomiting.   15 tablet   0   . rosuvastatin (CRESTOR) 20 MG tablet   Oral   Take by mouth.         . sertraline (ZOLOFT) 50 MG tablet   Oral   Take 50 mg by mouth daily.            Allergies Review of patient's allergies indicates no known allergies.   Family History  Problem Relation Age of Onset  . Cancer Father   . Hypertension Father   . Heart disease Father   . Cancer Mother   . Hypertension Mother    . Cancer Sister     Social History Social History  Substance Use Topics  . Smoking status: Former Smoker    Types: Cigarettes    Quit date: 10/07/2001  . Smokeless tobacco: None  . Alcohol Use: No    Review of Systems  Constitutional:   No fever or chills. No weight changes Eyes:   No blurry vision or double vision.  ENT:   Positive runny nose and sore throat. Cardiovascular:   No chest pain. Respiratory:   No dyspnea , positive productive cough. Gastrointestinal:  Positive mild generalized abdominal pain with vomiting and diarrhea.  No BRBPR or melena. Genitourinary:   Negative for dysuria, urinary retention, bloody urine, or difficulty urinating. Musculoskeletal:   Negative for back pain. No joint swelling or pain. Diffuse myalgia Skin:   Negative for rash. Neurological:   Negative for headaches, focal weakness or numbness. Psychiatric:  No anxiety or depression.   Endocrine:  No hot/cold intolerance, changes in energy, or sleep difficulty.  10-point ROS otherwise negative.  ____________________________________________   PHYSICAL EXAM:  VITAL SIGNS: ED Triage Vitals  Enc Vitals Group     BP 03/15/15 1131 143/90 mmHg     Pulse Rate 03/15/15 1131 115     Resp 03/15/15 1131 18     Temp 03/15/15 1131 98.5 F (36.9 C)     Temp Source 03/15/15 1131 Oral     SpO2 03/15/15 1131 96 %     Weight 03/15/15 1131 190 lb (86.183 kg)     Height 03/15/15 1131 5\' 3"  (1.6 m)     Head Cir --      Peak Flow --      Pain Score 03/15/15 1132 5     Pain Loc --      Pain Edu? --      Excl. in [place]? --      Constitutional:   Alert and oriented. Well appearing and in no distress. Eyes:   No  scleral icterus. No conjunctival pallor. PERRL. EOMI ENT   Head:   Normocephalic and atraumatic.   Nose:   No congestion/rhinnorhea. No septal hematoma   Mouth/Throat:   Dry mucous membranes, positive pharyngeal erythema. No peritonsillar mass. No uvula shift.   Neck:   No  stridor. No SubQ emphysema. No meningismus. Hematological/Lymphatic/Immunilogical:   No cervical lymphadenopathy. Cardiovascular:   Tachycardia heart rate 105. Normal and symmetric distal pulses are present in all extremities. No murmurs, rubs, or gallops. Respiratory:   Normal respiratory effort without tachypnea nor retractions. Breath sounds are clear and equal bilaterally. No wheezes/rales/rhonchi. Gastrointestinal:   Soft and nontender. No distention. There is no CVA tenderness.  No rebound, rigidity, or guarding. Genitourinary:   deferred Musculoskeletal:   Nontender with normal range of motion in all extremities. No joint effusions.  No lower extremity tenderness.  No edema. Neurologic:   Normal speech and language.  CN 2-10 normal. Motor grossly intact. No pronator drift.  Normal gait. No gross focal neurologic deficits are appreciated.  Skin:    Skin is warm, dry and intact. No rash noted.  No petechiae, purpura, or bullae. Psychiatric:   Mood and affect are normal. Speech and behavior are normal. Patient exhibits appropriate insight and judgment.  ____________________________________________    LABS (pertinent positives/negatives) (all labs ordered are listed, but only abnormal results are displayed) Labs Reviewed  COMPREHENSIVE METABOLIC PANEL - Abnormal; Notable for the following:    Glucose, Bld 317 (*)    BUN 21 (*)    All other components within normal limits  URINALYSIS COMPLETEWITH MICROSCOPIC ([facility] ONLY) - Abnormal; Notable for the following:    Color, Urine YELLOW (*)    APPearance CLEAR (*)    Glucose, UA 150 (*)    Ketones, ur TRACE (*)    Protein, ur 100 (*)    Leukocytes, UA 3+ (*)    Bacteria, UA RARE (*)    Squamous Epithelial / LPF 0-5 (*)    All other components within normal limits  LIPASE, BLOOD  CBC  TROPONIN I   ____________________________________________   EKG  Interpreted by me Sinus tachycardia rate 104, normal axis, prolonged QTC at  507. Left bundle branch block. Normal ST segments and T waves.  ____________________________________________    RADIOLOGY  Chest x-ray unremarkable  ____________________________________________   PROCEDURES   ____________________________________________   INITIAL IMPRESSION / ASSESSMENT AND PLAN / ED COURSE  Pertinent labs & imaging results that were available during my care of the patient were reviewed by me and considered in my medical decision making (see chart for details).  Patient presents with symptoms consistent with a viral URI or influenza-like illness. She did recently receive a flu shot.  Low suspicion for pneumonia UTI or sepsis. Low suspicion of mesenteric ischemia. Patient is responded well to IV hydration with improvement of her heart rate and symptoms. Continue her on antiemetics and antidiarrheals and other symptomatically.     ____________________________________________   FINAL CLINICAL IMPRESSION(S) / ED DIAGNOSES  Final diagnoses:  Influenza-like illness  Dehydration      Carrie Mew, MD 03/15/15 [phone]

## 2015-03-15 NOTE — ED Notes (Signed)
Pt states she woke up this AM at 0430 with vomiting, diaherra, body aches, abd pain radiating to her back and chills

## 2015-03-15 NOTE — Discharge Instructions (Signed)
Dehydration, Adult Dehydration is a condition in which you do not have enough fluid or water in your body. It happens when you take in less fluid than you lose. Vital organs such as the kidneys, brain, and heart cannot function without a proper amount of fluids. Any loss of fluids from the body can cause dehydration.  Dehydration can range from mild to severe. This condition should be treated right away to help prevent it from becoming severe. CAUSES  This condition may be caused by:  Vomiting.  Diarrhea.  Excessive sweating, such as when exercising in hot or humid weather.  Not drinking enough fluid during strenuous exercise or during an illness.  Excessive urine output.  Fever.  Certain medicines. RISK FACTORS This condition is more likely to develop in:  People who are taking certain medicines that cause the body to lose excess fluid (diuretics).   People who have a chronic illness, such as diabetes, that may increase urination.  Older adults.   People who live at high altitudes.   People who participate in endurance sports.  SYMPTOMS  Mild Dehydration  Thirst.  Dry lips.  Slightly dry mouth.  Dry, warm skin. Moderate Dehydration  Very dry mouth.   Muscle cramps.   Dark urine and decreased urine production.   Decreased tear production.   Headache.   Light-headedness, especially when you stand up from a sitting position.  Severe Dehydration  Changes in skin.   Cold and clammy skin.   Skin does not spring back quickly when lightly pinched and released.   Changes in body fluids.   Extreme thirst.   No tears.   Not able to sweat when body temperature is high, such as in hot weather.   Minimal urine production.   Changes in vital signs.   Rapid, weak pulse (more than 100 beats per minute when you are sitting still).   Rapid breathing.   Low blood pressure.   Other changes.   Sunken eyes.   Cold hands and feet.    Confusion.  Lethargy and difficulty being awakened.  Fainting (syncope).   Short-term weight loss.   Unconsciousness. DIAGNOSIS  This condition may be diagnosed based on your symptoms. You may also have tests to determine how severe your dehydration is. These tests may include:   Urine tests.   Blood tests.  TREATMENT  Treatment for this condition depends on the severity. Mild or moderate dehydration can often be treated at home. Treatment should be started right away. Do not wait until dehydration becomes severe. Severe dehydration needs to be treated at the hospital. Treatment for Mild Dehydration  Drinking plenty of water to replace the fluid you have lost.   Replacing minerals in your blood (electrolytes) that you may have lost.  Treatment for Moderate Dehydration  Consuming oral rehydration solution (ORS). Treatment for Severe Dehydration  Receiving fluid through an IV tube.   Receiving electrolyte solution through a feeding tube that is passed through your nose and into your stomach (nasogastric tube or NG tube).  Correcting any abnormalities in electrolytes. HOME CARE INSTRUCTIONS   Drink enough fluid to keep your urine clear or pale yellow.   Drink water or fluid slowly by taking small sips. You can also try sucking on ice cubes.  Have food or beverages that contain electrolytes. Examples include bananas and sports drinks.  Take over-the-counter and prescription medicines only as told by your health care provider.   Prepare ORS according to the manufacturer's instructions. Take sips  of ORS every 5 minutes until your urine returns to normal.  If you have vomiting or diarrhea, continue to try to drink water, ORS, or both.   If you have diarrhea, avoid:   Beverages that contain caffeine.   Fruit juice.   Milk.   Carbonated soft drinks.  Do not take salt tablets. This can lead to the condition of having too much sodium in your body  (hypernatremia).  SEEK MEDICAL CARE IF:  You cannot eat or drink without vomiting.  You have had moderate diarrhea during a period of more than 24 hours.  You have a fever. SEEK IMMEDIATE MEDICAL CARE IF:   You have extreme thirst.  You have severe diarrhea.  You have not urinated in 6-8 hours, or you have urinated only a small amount of very dark urine.  You have shriveled skin.  You are dizzy, confused, or both.   This information is not intended to replace advice given to you by your health care provider. Make sure you discuss any questions you have with your health care provider.   Document Released: 03/15/2005 Document Revised: 12/04/2014 Document Reviewed: 07/31/2014 Elsevier Interactive Patient Education 2016 [employer]  Viral Infections A viral infection can be caused by different types of viruses.Most viral infections are not serious and resolve on their own. However, some infections may cause severe symptoms and may lead to further complications. SYMPTOMS Viruses can frequently cause:  Minor sore throat.  Aches and pains.  Headaches.  Runny nose.  Different types of rashes.  Watery eyes.  Tiredness.  Cough.  Loss of appetite.  Gastrointestinal infections, resulting in nausea, vomiting, and diarrhea. These symptoms do not respond to antibiotics because the infection is not caused by bacteria. However, you might catch a bacterial infection following the viral infection. This is sometimes called a "superinfection." Symptoms of such a bacterial infection may include:  Worsening sore throat with pus and difficulty swallowing.  Swollen neck glands.  Chills and a high or persistent fever.  Severe headache.  Tenderness over the sinuses.  Persistent overall ill feeling (malaise), muscle aches, and tiredness (fatigue).  Persistent cough.  Yellow, green, or brown mucus production with coughing. HOME CARE INSTRUCTIONS   Only take  over-the-counter or prescription medicines for pain, discomfort, diarrhea, or fever as directed by your caregiver.  Drink enough water and fluids to keep your urine clear or pale yellow. Sports drinks can provide valuable electrolytes, sugars, and hydration.  Get plenty of rest and maintain proper nutrition. Soups and broths with crackers or rice are fine. SEEK IMMEDIATE MEDICAL CARE IF:   You have severe headaches, shortness of breath, chest pain, neck pain, or an unusual rash.  You have uncontrolled vomiting, diarrhea, or you are unable to keep down fluids.  You or your child has an oral temperature above 102 F (38.9 C), not controlled by medicine.  Your baby is older than 3 months with a rectal temperature of 102 F (38.9 C) or higher.  Your baby is 57 months old or younger with a rectal temperature of 100.4 F (38 C) or higher. MAKE SURE YOU:   Understand these instructions.  Will watch your condition.  Will get help right away if you are not doing well or get worse.   This information is not intended to replace advice given to you by your health care provider. Make sure you discuss any questions you have with your health care provider.   Document Released: 12/23/2004 Document Revised: 06/07/2011 Document  Reviewed: 08/21/2014 Elsevier Interactive Patient Education [employer].

## 2015-03-19 ENCOUNTER — Encounter: Payer: Self-pay | Admitting: Nurse Practitioner

## 2015-06-12 ENCOUNTER — Ambulatory Visit
Admission: RE | Admit: 2015-06-12 | Discharge: 2015-06-12 | Disposition: A | Discharge status: Home / Self Care | Payer: BLUE CROSS/BLUE SHIELD | Source: Ambulatory Visit | Attending: Nurse Practitioner | Admitting: Nurse Practitioner

## 2015-06-12 DIAGNOSIS — Z1231 Encounter for screening mammogram for malignant neoplasm of breast: Secondary | ICD-10-CM

## 2015-10-01 ENCOUNTER — Emergency Department: Payer: BLUE CROSS/BLUE SHIELD

## 2015-10-01 ENCOUNTER — Emergency Department
Admission: EM | Admit: 2015-10-01 | Discharge: 2015-10-01 | Disposition: A | Discharge status: Home / Self Care | Payer: BLUE CROSS/BLUE SHIELD | Attending: Emergency Medicine | Admitting: Emergency Medicine

## 2015-10-01 DIAGNOSIS — Z87891 Personal history of nicotine dependence: Secondary | ICD-10-CM | POA: Insufficient documentation

## 2015-10-01 DIAGNOSIS — R42 Dizziness and giddiness: Secondary | ICD-10-CM

## 2015-10-01 DIAGNOSIS — Z7982 Long term (current) use of aspirin: Secondary | ICD-10-CM | POA: Insufficient documentation

## 2015-10-01 DIAGNOSIS — E119 Type 2 diabetes mellitus without complications: Secondary | ICD-10-CM | POA: Diagnosis not present

## 2015-10-01 DIAGNOSIS — Z7984 Long term (current) use of oral hypoglycemic drugs: Secondary | ICD-10-CM | POA: Insufficient documentation

## 2015-10-01 DIAGNOSIS — Z8673 Personal history of transient ischemic attack (TIA), and cerebral infarction without residual deficits: Secondary | ICD-10-CM | POA: Insufficient documentation

## 2015-10-01 DIAGNOSIS — R51 Headache: Secondary | ICD-10-CM | POA: Diagnosis present

## 2015-10-01 DIAGNOSIS — I10 Essential (primary) hypertension: Secondary | ICD-10-CM | POA: Insufficient documentation

## 2015-10-01 LAB — URINALYSIS COMPLETE WITH MICROSCOPIC (ARMC ONLY)
Bacteria, UA: NONE SEEN
Bilirubin Urine: NEGATIVE
Glucose, UA: NEGATIVE mg/dL
Ketones, ur: NEGATIVE mg/dL
Leukocytes, UA: NEGATIVE
Nitrite: NEGATIVE
Protein, ur: NEGATIVE mg/dL
Specific Gravity, Urine: 1.006 (ref 1.005–1.030)
Squamous Epithelial / LPF: NONE SEEN
pH: 6 (ref 5.0–8.0)

## 2015-10-01 LAB — CBC
HCT: 38.6 % (ref 35.0–47.0)
Hemoglobin: 13 g/dL (ref 12.0–16.0)
MCH: 31.9 pg (ref 26.0–34.0)
MCHC: 33.8 g/dL (ref 32.0–36.0)
MCV: 94.4 fL (ref 80.0–100.0)
Platelets: 205 10*3/uL (ref 150–440)
RBC: 4.09 MIL/uL (ref 3.80–5.20)
RDW: 13.2 % (ref 11.5–14.5)
WBC: 8.7 10*3/uL (ref 3.6–11.0)

## 2015-10-01 LAB — BASIC METABOLIC PANEL
Anion gap: 8 (ref 5–15)
BUN: 12 mg/dL (ref 6–20)
CO2: 24 mmol/L (ref 22–32)
Calcium: 9.3 mg/dL (ref 8.9–10.3)
Chloride: 108 mmol/L (ref 101–111)
Creatinine, Ser: 0.65 mg/dL (ref 0.44–1.00)
GFR calc Af Amer: >60 mL/min (ref >60)
GFR calc non Af Amer: >60 mL/min (ref >60)
Glucose, Bld: 117 mg/dL — ABNORMAL HIGH (ref 65–99)
Potassium: 4.3 mmol/L (ref 3.5–5.1)
Sodium: 140 mmol/L (ref 135–145)

## 2015-10-01 MED ORDER — KETOROLAC TROMETHAMINE 30 MG/ML IJ SOLN
15.0000 mg | INTRAMUSCULAR | Status: AC
  Administered 2015-10-01: 15 mg via INTRAVENOUS
  Filled 2015-10-01: qty 1

## 2015-10-01 MED ORDER — DIPHENHYDRAMINE HCL 50 MG/ML IJ SOLN
25.0000 mg | Freq: Once | INTRAMUSCULAR | Status: AC
  Administered 2015-10-01: 25 mg via INTRAVENOUS
  Filled 2015-10-01: qty 1

## 2015-10-01 MED ORDER — METOCLOPRAMIDE HCL 5 MG/ML IJ SOLN
10.0000 mg | Freq: Once | INTRAMUSCULAR | Status: AC
  Administered 2015-10-01: 10 mg via INTRAVENOUS
  Filled 2015-10-01: qty 2

## 2015-10-01 MED ORDER — SODIUM CHLORIDE 0.9 % IV BOLUS (SEPSIS)
1000.0000 mL | Freq: Once | INTRAVENOUS | Status: AC
  Administered 2015-10-01: 1000 mL via INTRAVENOUS

## 2015-10-01 NOTE — ED Notes (Signed)
Pt able to ambulate without difficulty.

## 2015-10-01 NOTE — ED Notes (Signed)
Patient presents to the ED with headache, dizziness, and palpitations.  Patient states, "it's like my chest is fluttering."  Patient states, "last night I was driving and then all the sudden [place] and I didn't remember driving out there and I didn't know what I was doing."  Patient denies having an accident in the car and denies any knowledge of a syncopal episode.  Patient reports her headache began prior to the loss of memory and patient states she is still having a headache.  Denies taking blood thinners.  Patient's gait is unsteady.

## 2015-10-01 NOTE — Discharge Instructions (Signed)
Dizziness Dizziness is a common problem. It is a feeling of unsteadiness or light-headedness. You may feel like you are about to faint. Dizziness can lead to injury if you stumble or fall. Anyone can become dizzy, but dizziness is more common in older adults. This condition can be caused by a number of things, including medicines, dehydration, or illness. HOME CARE INSTRUCTIONS Taking these steps may help with your condition: Eating and Drinking  Drink enough fluid to keep your urine clear or pale yellow. This helps to keep you from becoming dehydrated. Try to drink more clear fluids, such as water.  Do not drink alcohol.  Limit your caffeine intake if directed by your health care provider.  Limit your salt intake if directed by your health care provider. Activity  Avoid making quick movements.  Rise slowly from chairs and steady yourself until you feel okay.  In the morning, first sit up on the side of the bed. When you feel okay, stand slowly while you hold onto something until you know that your balance is fine.  Move your legs often if you need to stand in one place for a long time. Tighten and relax your muscles in your legs while you are standing.  Do not drive or operate heavy machinery if you feel dizzy.  Avoid bending down if you feel dizzy. Place items in your home so that they are easy for you to reach without leaning over. Lifestyle  Do not use any tobacco products, including cigarettes, chewing tobacco, or electronic cigarettes. If you need help quitting, ask your health care provider.  Try to reduce your stress level, such as with yoga or meditation. Talk with your health care provider if you need help. General Instructions  Watch your dizziness for any changes.  Take medicines only as directed by your health care provider. Talk with your health care provider if you think that your dizziness is caused by a medicine that you are taking.  Tell a friend or a family  member that you are feeling dizzy. If he or she notices any changes in your behavior, have this person call your health care provider.  Keep all follow-up visits as directed by your health care provider. This is important. SEEK MEDICAL CARE IF:  Your dizziness does not go away.  Your dizziness or light-headedness gets worse.  You feel nauseous.  You have reduced hearing.  You have new symptoms.  You are unsteady on your feet or you feel like the room is spinning. SEEK IMMEDIATE MEDICAL CARE IF:  You vomit or have diarrhea and are unable to eat or drink anything.  You have problems talking, walking, swallowing, or using your arms, hands, or legs.  You feel generally weak.  You are not thinking clearly or you have trouble forming sentences. It may take a friend or family member to notice this.  You have chest pain, abdominal pain, shortness of breath, or sweating.  Your vision changes.  You notice any bleeding.  You have a headache.  You have neck pain or a stiff neck.  You have a fever.   This information is not intended to replace advice given to you by your health care provider. Make sure you discuss any questions you have with your health care provider.   Document Released: 09/08/2000 Document Revised: 07/30/2014 Document Reviewed: 03/11/2014 Elsevier Interactive Patient Education [employer].   Please be sure to drink plenty of fluids. Please return if you have any further problems if  you have any trouble walking please call 911. Please follow-up with your primary care doctor later on this week. Again return if you're worse at all.

## 2015-10-01 NOTE — ED Provider Notes (Signed)
[facility] Emergency Department Provider Note  ____________________________________________  Time seen: 7:15 PM  I have reviewed the triage vital signs and the nursing notes.   HISTORY  Chief Complaint Memory Loss; Headache; and Dizziness    HPI Candace [name] is a 65 y.o. female who complains of headache bilateral frontal and posterior that started yesterday associated with dizziness and lightheadedness on standing. She feels like she is having palpitations as well. No chest pain or shortness of breath. States that she had an episode yesterday while driving where she suddenly realized that she was far out in the country and was not sure how she had driven there or why. She had no recollection of driving there. She had not run off the road orbeen involved in any accidents. No numbness tingling weakness or vision changes. No nausea vomiting or diarrhea, normal oral intake.     Past Medical History  Diagnosis Date  . Reflux   . Anxiety   . Arrhythmia   . Arthritis   . Diabetes mellitus without complication ([place])   . Hypertension   . Hyperlipidemia   . Stroke [facility])      Patient Active Problem List   Diagnosis Date Noted  . Benign essential HTN 10/28/2014  . Episode of syncope 10/25/2014  . Carotid artery narrowing 02/08/2014  . Chest pain 02/08/2014  . Combined fat and carbohydrate induced hyperlipemia 02/08/2014  . Atypical chest pain 02/07/2014  . Arteriosclerosis of coronary artery 02/07/2014  . Essential (primary) hypertension 02/07/2014  . Awareness of heartbeats 02/07/2014  . Abdominal pain 10/05/2013  . D (diarrhea) 10/05/2013  . Acid reflux 10/05/2013     Past Surgical History  Procedure Laterality Date  . Appendectomy    . Cesarean section    . Tubal ligation       Current Outpatient Rx  Name  Route  Sig  Dispense  Refill  . acetaminophen (TYLENOL) 325 MG tablet   Oral   Take 2 tablets (650 mg total) by mouth every 6 (six)  hours as needed.   60 tablet   0   . aspirin EC 81 MG tablet   Oral   Take 81 mg by mouth daily.         Marland Kitchen aspirin-acetaminophen-caffeine (EXCEDRIN MIGRAINE) 250-250-65 MG per tablet   Oral   Take 2 tablets by mouth every 6 (six) hours as needed for headache.         Marland Kitchen glipiZIDE (GLUCOTROL) 10 MG tablet   Oral   Take 5-10 mg by mouth 2 (two) times daily. Pt takes one tablet in the morning and one-half tablet at bedtime.         . hydrochlorothiazide (MICROZIDE) 12.5 MG capsule   Oral   Take 12.5 mg by mouth daily.         . isosorbide mononitrate (IMDUR) 30 MG 24 hr tablet   Oral   Take 30 mg by mouth daily.         Marland Kitchen linagliptin (TRADJENTA) 5 MG TABS tablet   Oral   Take 5 mg by mouth daily.         Marland Kitchen lisinopril (PRINIVIL,ZESTRIL) 20 MG tablet   Oral   Take 20 mg by mouth daily.         Marland Kitchen loperamide (IMODIUM A-D) 2 MG tablet   Oral   Take 2 tablets (4 mg total) by mouth 4 (four) times daily as needed for diarrhea or loose stools.   [place]  tablet   0   . meloxicam (MOBIC) 15 MG tablet   Oral   Take 15 mg by mouth every 8 (eight) hours as needed for pain.         . metFORMIN (GLUCOPHAGE-XR) 500 MG 24 hr tablet   Oral   Take 500 mg by mouth 2 (two) times daily.         . metoprolol (LOPRESSOR) 100 MG tablet   Oral   Take 100 mg by mouth 2 (two) times daily.         . ondansetron (ZOFRAN ODT) 8 MG disintegrating tablet   Oral   Take 1 tablet (8 mg total) by mouth every 8 (eight) hours as needed for nausea or vomiting.   20 tablet   0   . ondansetron (ZOFRAN) 4 MG tablet   Oral   Take 4 mg by mouth every 8 (eight) hours as needed for nausea or vomiting.         . pantoprazole (PROTONIX) 40 MG tablet   Oral   Take 40 mg by mouth daily.         . promethazine (PHENERGAN) 25 MG tablet   Oral   Take 1 tablet (25 mg total) by mouth every 6 (six) hours as needed for nausea or vomiting.   15 tablet   0   . rosuvastatin (CRESTOR) 20 MG  tablet   Oral   Take by mouth.         . sertraline (ZOLOFT) 50 MG tablet   Oral   Take 50 mg by mouth daily.            Allergies Review of patient's allergies indicates no known allergies.   Family History  Problem Relation Age of Onset  . Cancer Father   . Hypertension Father   . Heart disease Father   . Cancer Mother   . Hypertension Mother   . Cancer Sister   . Breast cancer Sister 42  . Breast cancer Sister 34    Social History Social History  Substance Use Topics  . Smoking status: Former Smoker    Types: Cigarettes    Quit date: 10/07/2001  . Smokeless tobacco: Not on file  . Alcohol Use: No    Review of Systems  Constitutional:   No fever or chills.  ENT:   No sore throat. No rhinorrhea. Cardiovascular:   No chest pain.Positive palpitations Respiratory:   No dyspnea or cough. Gastrointestinal:   Negative for abdominal pain, vomiting and diarrhea.  Genitourinary:   Negative for dysuria or difficulty urinating. Musculoskeletal:   Negative for focal pain or swelling Neurological:   Positive headache 10-point ROS otherwise negative.  ____________________________________________   PHYSICAL EXAM:  VITAL SIGNS: ED Triage Vitals  Enc Vitals Group     BP 10/01/15 1736 155/71 mmHg     Pulse Rate 10/01/15 1736 69     Resp 10/01/15 1736 18     Temp 10/01/15 1736 98.8 F (37.1 C)     Temp Source 10/01/15 1736 Oral     SpO2 10/01/15 1736 96 %     Weight 10/01/15 1733 180 lb (81.647 kg)     Height 10/01/15 1733 5\' 3"  (1.6 m)     Head Cir --      Peak Flow --      Pain Score --      Pain Loc --      Pain Edu? --      Excl. in  GC? --     Vital signs reviewed, nursing assessments reviewed.   Constitutional:   Alert and oriented. Well appearing and in no distress. Eyes:   No scleral icterus. No conjunctival pallor. PERRL. EOMI.  No nystagmus. ENT   Head:   Normocephalic and atraumatic.   Nose:   No congestion/rhinnorhea. No septal  hematoma   Mouth/Throat:   MMM, no pharyngeal erythema. No peritonsillar mass.    Neck:   No SubQ emphysema. No meningismus. Hematological/Lymphatic/Immunilogical:   No cervical lymphadenopathy. Cardiovascular:   RRR. Symmetric bilateral radial and DP pulses.  No murmurs.  Respiratory:   Normal respiratory effort without tachypnea nor retractions. Breath sounds are clear and equal bilaterally. No wheezes/rales/rhonchi. Gastrointestinal:   Soft and nontender. Non distended. There is no CVA tenderness.  No rebound, rigidity, or guarding. Genitourinary:   deferred Musculoskeletal:   Nontender with normal range of motion in all extremities. No joint effusions.  No lower extremity tenderness.  No edema. Neurologic:   Normal speech and language.  CN 2-10 normal. Motor grossly intact. No pronator drift. Normal finger-nose-finger. Somewhat unsteady gait, reports that she feels very dizzy when she stands up and walks, like her vision is darkening and she is lightheaded and feels like she will pass out. Skin:    Skin is warm, dry and intact. No rash noted.  No petechiae, purpura, or bullae.  ____________________________________________    LABS (pertinent positives/negatives) (all labs ordered are listed, but only abnormal results are displayed) Labs Reviewed  BASIC METABOLIC PANEL - Abnormal; Notable for the following:    Glucose, Bld 117 (*)    All other components within normal limits  CBC  URINALYSIS COMPLETEWITH MICROSCOPIC ([facility] ONLY)  CBG MONITORING, ED   ____________________________________________   EKG  Interpreted by me Normal sinus rhythm rate of 68, normal axis. Normal intervals. Left bundle branch block. T-wave inversions in 1 and aVL. Unchanged compared to 03/16/2015.  ____________________________________________    RADIOLOGY  CT head  unremarkable  ____________________________________________   PROCEDURES   ____________________________________________   INITIAL IMPRESSION / ASSESSMENT AND PLAN / ED COURSE  Pertinent labs & imaging results that were available during my care of the patient were reviewed by me and considered in my medical decision making (see chart for details).  Patient well appearing no acute distress. Vital signs unremarkable. Complains of dizziness and headache, found to have orthostatic dizziness. Cerebellar testing is intact. Check labs, give IV fluid bolus, reassess.  ----------------------------------------- 8:04 PM on 10/01/2015 ----------------------------------------- Labs unremarkable. Patient given headache cocktail. IV fluids in process. Care signed out to Dr. Cinda Quest to reassess.   If symptoms improved with rehydration, I think the patient is stable for outpatient follow-up with primary care this week.   If symptoms are unchanged and she is still unsteady on her feet, would consider hospitalizing for further workup of dizziness with gait instability.      ____________________________________________   FINAL CLINICAL IMPRESSION(S) / ED DIAGNOSES  Final diagnoses:  Dizziness       Portions of this note were generated with dragon dictation software. Dictation errors may occur despite best attempts at proofreading.   Carrie Mew, MD 10/01/15 2006

## 2015-10-01 NOTE — ED Provider Notes (Signed)
After fluid goes and patient is able to walk without difficulty. We'll discharge her as planned.  Nena Polio, MD 10/01/15 2201

## 2015-11-05 ENCOUNTER — Other Ambulatory Visit: Payer: Self-pay

## 2015-11-05 ENCOUNTER — Encounter: Payer: Self-pay | Admitting: *Deleted

## 2015-11-05 ENCOUNTER — Emergency Department: Payer: BLUE CROSS/BLUE SHIELD

## 2015-11-05 ENCOUNTER — Observation Stay
Admission: EM | Admit: 2015-11-05 | Discharge: 2015-11-07 | Payer: BLUE CROSS/BLUE SHIELD | Attending: Internal Medicine | Admitting: Internal Medicine

## 2015-11-05 DIAGNOSIS — Z809 Family history of malignant neoplasm, unspecified: Secondary | ICD-10-CM | POA: Diagnosis not present

## 2015-11-05 DIAGNOSIS — R51 Headache: Secondary | ICD-10-CM | POA: Diagnosis not present

## 2015-11-05 DIAGNOSIS — Z87891 Personal history of nicotine dependence: Secondary | ICD-10-CM | POA: Diagnosis not present

## 2015-11-05 DIAGNOSIS — G43909 Migraine, unspecified, not intractable, without status migrainosus: Secondary | ICD-10-CM | POA: Diagnosis not present

## 2015-11-05 DIAGNOSIS — I25110 Atherosclerotic heart disease of native coronary artery with unstable angina pectoris: Secondary | ICD-10-CM | POA: Diagnosis not present

## 2015-11-05 DIAGNOSIS — I10 Essential (primary) hypertension: Secondary | ICD-10-CM | POA: Insufficient documentation

## 2015-11-05 DIAGNOSIS — Z803 Family history of malignant neoplasm of breast: Secondary | ICD-10-CM | POA: Diagnosis not present

## 2015-11-05 DIAGNOSIS — R072 Precordial pain: Secondary | ICD-10-CM

## 2015-11-05 DIAGNOSIS — Z79899 Other long term (current) drug therapy: Secondary | ICD-10-CM | POA: Insufficient documentation

## 2015-11-05 DIAGNOSIS — E7800 Pure hypercholesterolemia, unspecified: Secondary | ICD-10-CM | POA: Diagnosis not present

## 2015-11-05 DIAGNOSIS — F419 Anxiety disorder, unspecified: Secondary | ICD-10-CM | POA: Diagnosis not present

## 2015-11-05 DIAGNOSIS — E785 Hyperlipidemia, unspecified: Secondary | ICD-10-CM | POA: Insufficient documentation

## 2015-11-05 DIAGNOSIS — Z8673 Personal history of transient ischemic attack (TIA), and cerebral infarction without residual deficits: Secondary | ICD-10-CM | POA: Diagnosis not present

## 2015-11-05 DIAGNOSIS — Z8249 Family history of ischemic heart disease and other diseases of the circulatory system: Secondary | ICD-10-CM | POA: Diagnosis not present

## 2015-11-05 DIAGNOSIS — E782 Mixed hyperlipidemia: Secondary | ICD-10-CM | POA: Diagnosis not present

## 2015-11-05 DIAGNOSIS — E7801 Familial hypercholesterolemia: Secondary | ICD-10-CM | POA: Diagnosis not present

## 2015-11-05 DIAGNOSIS — M1990 Unspecified osteoarthritis, unspecified site: Secondary | ICD-10-CM | POA: Diagnosis not present

## 2015-11-05 DIAGNOSIS — T463X5A Adverse effect of coronary vasodilators, initial encounter: Secondary | ICD-10-CM | POA: Diagnosis not present

## 2015-11-05 DIAGNOSIS — E119 Type 2 diabetes mellitus without complications: Secondary | ICD-10-CM | POA: Insufficient documentation

## 2015-11-05 DIAGNOSIS — Z7982 Long term (current) use of aspirin: Secondary | ICD-10-CM | POA: Diagnosis not present

## 2015-11-05 DIAGNOSIS — R531 Weakness: Secondary | ICD-10-CM | POA: Insufficient documentation

## 2015-11-05 DIAGNOSIS — R079 Chest pain, unspecified: Secondary | ICD-10-CM | POA: Diagnosis present

## 2015-11-05 DIAGNOSIS — K219 Gastro-esophageal reflux disease without esophagitis: Secondary | ICD-10-CM | POA: Insufficient documentation

## 2015-11-05 DIAGNOSIS — Z880 Allergy status to penicillin: Secondary | ICD-10-CM | POA: Diagnosis not present

## 2015-11-05 DIAGNOSIS — Z794 Long term (current) use of insulin: Secondary | ICD-10-CM | POA: Diagnosis not present

## 2015-11-05 DIAGNOSIS — I499 Cardiac arrhythmia, unspecified: Secondary | ICD-10-CM | POA: Insufficient documentation

## 2015-11-05 LAB — BASIC METABOLIC PANEL
Anion gap: 7 (ref 5–15)
BUN: 14 mg/dL (ref 6–20)
CO2: 25 mmol/L (ref 22–32)
Calcium: 9.3 mg/dL (ref 8.9–10.3)
Chloride: 109 mmol/L (ref 101–111)
Creatinine, Ser: 0.7 mg/dL (ref 0.44–1.00)
GFR calc Af Amer: >60 mL/min (ref >60)
GFR calc non Af Amer: >60 mL/min (ref >60)
Glucose, Bld: 101 mg/dL — ABNORMAL HIGH (ref 65–99)
Potassium: 3.5 mmol/L (ref 3.5–5.1)
Sodium: 141 mmol/L (ref 135–145)

## 2015-11-05 LAB — CBC
HCT: 41 % (ref 35.0–47.0)
Hemoglobin: 14.2 g/dL (ref 12.0–16.0)
MCH: 32.3 pg (ref 26.0–34.0)
MCHC: 34.8 g/dL (ref 32.0–36.0)
MCV: 93.1 fL (ref 80.0–100.0)
Platelets: 235 10*3/uL (ref 150–440)
RBC: 4.4 MIL/uL (ref 3.80–5.20)
RDW: 13.1 % (ref 11.5–14.5)
WBC: 8.9 10*3/uL (ref 3.6–11.0)

## 2015-11-05 LAB — TROPONIN I: Troponin I: <0.03 ng/mL (ref <0.03)

## 2015-11-05 MED ORDER — METFORMIN HCL ER 500 MG PO TB24
500.0000 mg | ORAL_TABLET | Freq: Two times a day (BID) | ORAL | Status: DC
  Administered 2015-11-06: 500 mg via ORAL
  Filled 2015-11-05: qty 1

## 2015-11-05 MED ORDER — ONDANSETRON HCL 4 MG/2ML IJ SOLN
INTRAMUSCULAR | Status: AC
  Administered 2015-11-05: 4 mg via INTRAVENOUS
  Filled 2015-11-05: qty 2

## 2015-11-05 MED ORDER — ACETAMINOPHEN 650 MG RE SUPP: 650.0000 mg | Freq: Four times a day (QID) | RECTAL | Status: DC | PRN

## 2015-11-05 MED ORDER — ONDANSETRON HCL 4 MG PO TABS
4.0000 mg | ORAL_TABLET | Freq: Four times a day (QID) | ORAL | Status: DC | PRN
  Administered 2015-11-05 – 2015-11-07 (×2): 4 mg via ORAL
  Filled 2015-11-05 (×2): qty 1

## 2015-11-05 MED ORDER — ASPIRIN EC 81 MG PO TBEC
81.0000 mg | DELAYED_RELEASE_TABLET | Freq: Every day | ORAL | Status: DC
  Administered 2015-11-06 – 2015-11-07 (×2): 81 mg via ORAL
  Filled 2015-11-05 (×2): qty 1

## 2015-11-05 MED ORDER — ONDANSETRON HCL 4 MG/2ML IJ SOLN
4.0000 mg | Freq: Four times a day (QID) | INTRAMUSCULAR | Status: DC | PRN
  Administered 2015-11-06 – 2015-11-07 (×2): 4 mg via INTRAVENOUS
  Filled 2015-11-05 (×2): qty 2

## 2015-11-05 MED ORDER — LISINOPRIL 20 MG PO TABS
20.0000 mg | ORAL_TABLET | Freq: Every day | ORAL | Status: DC
  Administered 2015-11-06: 20 mg via ORAL
  Filled 2015-11-05 (×2): qty 1

## 2015-11-05 MED ORDER — HYDROCHLOROTHIAZIDE 12.5 MG PO CAPS
12.5000 mg | ORAL_CAPSULE | Freq: Every day | ORAL | Status: DC
  Administered 2015-11-06: 12.5 mg via ORAL
  Filled 2015-11-05 (×2): qty 1

## 2015-11-05 MED ORDER — ACETAMINOPHEN 325 MG PO TABS
650.0000 mg | ORAL_TABLET | Freq: Once | ORAL | Status: AC
  Administered 2015-11-05: 650 mg via ORAL
  Filled 2015-11-05: qty 2

## 2015-11-05 MED ORDER — ONDANSETRON HCL 4 MG PO TABS: 4.0000 mg | ORAL_TABLET | Freq: Three times a day (TID) | ORAL | Status: DC | PRN

## 2015-11-05 MED ORDER — ROSUVASTATIN CALCIUM 20 MG PO TABS
20.0000 mg | ORAL_TABLET | Freq: Every day | ORAL | Status: DC
  Administered 2015-11-06 – 2015-11-07 (×2): 20 mg via ORAL
  Filled 2015-11-05 (×2): qty 1

## 2015-11-05 MED ORDER — ENOXAPARIN SODIUM 40 MG/0.4ML ~~LOC~~ SOLN
40.0000 mg | SUBCUTANEOUS | Status: DC
  Administered 2015-11-05: 40 mg via SUBCUTANEOUS
  Filled 2015-11-05: qty 0.4

## 2015-11-05 MED ORDER — METOPROLOL TARTRATE 50 MG PO TABS
100.0000 mg | ORAL_TABLET | Freq: Two times a day (BID) | ORAL | Status: DC
  Administered 2015-11-05 – 2015-11-06 (×3): 100 mg via ORAL
  Filled 2015-11-05 (×4): qty 2

## 2015-11-05 MED ORDER — ACETAMINOPHEN 325 MG PO TABS
650.0000 mg | ORAL_TABLET | Freq: Four times a day (QID) | ORAL | Status: DC | PRN
  Administered 2015-11-05 – 2015-11-06 (×2): 650 mg via ORAL
  Filled 2015-11-05 (×3): qty 2

## 2015-11-05 MED ORDER — NITROGLYCERIN 0.4 MG SL SUBL
0.4000 mg | SUBLINGUAL_TABLET | SUBLINGUAL | Status: DC | PRN
  Administered 2015-11-05 – 2015-11-07 (×4): 0.4 mg via SUBLINGUAL
  Filled 2015-11-05 (×3): qty 1

## 2015-11-05 MED ORDER — ONDANSETRON HCL 4 MG/2ML IJ SOLN
4.0000 mg | Freq: Once | INTRAMUSCULAR | Status: AC
  Administered 2015-11-05: 4 mg via INTRAVENOUS

## 2015-11-05 MED ORDER — ASPIRIN 81 MG PO CHEW
324.0000 mg | CHEWABLE_TABLET | Freq: Once | ORAL | Status: AC
Start: 2015-11-05 — End: 2015-11-05
  Administered 2015-11-05: 324 mg via ORAL
  Filled 2015-11-05: qty 4

## 2015-11-05 MED ORDER — SERTRALINE HCL 50 MG PO TABS
50.0000 mg | ORAL_TABLET | Freq: Every day | ORAL | Status: DC
  Administered 2015-11-06 – 2015-11-07 (×2): 50 mg via ORAL
  Filled 2015-11-05 (×2): qty 1

## 2015-11-05 MED ORDER — PANTOPRAZOLE SODIUM 40 MG PO TBEC
40.0000 mg | DELAYED_RELEASE_TABLET | Freq: Every day | ORAL | Status: DC
  Administered 2015-11-06 – 2015-11-07 (×2): 40 mg via ORAL
  Filled 2015-11-05 (×2): qty 1

## 2015-11-05 MED ORDER — GLIPIZIDE 5 MG PO TABS
5.0000 mg | ORAL_TABLET | Freq: Two times a day (BID) | ORAL | Status: DC
Start: 2015-11-05 — End: 2015-11-06
  Administered 2015-11-05 – 2015-11-06 (×2): 5 mg via ORAL
  Filled 2015-11-05 (×2): qty 1
  Filled 2015-11-05: qty 2

## 2015-11-05 MED ORDER — ISOSORBIDE MONONITRATE ER 30 MG PO TB24
30.0000 mg | ORAL_TABLET | Freq: Every day | ORAL | Status: DC
  Administered 2015-11-06 – 2015-11-07 (×2): 30 mg via ORAL
  Filled 2015-11-05 (×2): qty 1

## 2015-11-05 MED ORDER — LINAGLIPTIN 5 MG PO TABS
5.0000 mg | ORAL_TABLET | Freq: Every day | ORAL | Status: DC
  Administered 2015-11-06 – 2015-11-07 (×2): 5 mg via ORAL
  Filled 2015-11-05 (×3): qty 1

## 2015-11-05 NOTE — H&P (Signed)
[facility] [place] NAME: Candace [name]    MR#:  [id number]  DATE OF BIRTH:  May 24, 1950  DATE OF ADMISSION:  11/05/2015  PRIMARY CARE PHYSICIAN: [place]   REQUESTING/REFERRING PHYSICIAN: Dr. Brenton Grills  CHIEF COMPLAINT:   Chief Complaint  Patient presents with  . Chest Pain    HISTORY OF PRESENT ILLNESS:  Candace [name]  is a 65 y.o. female with a known history of Diabetes, hypertension, hyperlipidemia, GERD, history of previous CVA, anxiety who presents to the hospital due to Chest Pain.  Pt. Chest pain was sharp in nature located in the center of her chest, nonradiating to the arm or upper jaw. She did have some associated nausea, diaphoresis with it. Patient says the pain was about an 8 out of 10 in intensity earlier and instrumental for now. Patient developed the pain was she was at work. She says that she started seeing black spots in front of her eyes and then she became dizzy and lightheaded but did not have a full syncopal episode, shortly thereafter she developed the chest pain as mentioned. She was concerned therefore came to the ER for further evaluation. In the emergency room patient was noted to have subtle T-wave inversions in the lateral leads and therefore hospitalist services were contacted further treatment and evaluation.  PAST MEDICAL HISTORY:   Past Medical History:  Diagnosis Date  . Anxiety   . Arrhythmia   . Arthritis   . Diabetes mellitus without complication ([place])   . Hyperlipidemia   . Hypertension   . Reflux   . Stroke [facility])     PAST SURGICAL HISTORY:   Past Surgical History:  Procedure Laterality Date  . APPENDECTOMY    . CESAREAN SECTION    . TUBAL LIGATION      SOCIAL HISTORY:   Social History  Substance Use Topics  . Smoking status: Former Smoker    Types: Cigarettes    Quit date: 10/07/2001  . Smokeless tobacco: Not on file  . Alcohol use No    FAMILY HISTORY:    Family History  Problem Relation Age of Onset  . Cancer Father   . Hypertension Father   . Heart disease Father   . Cancer Mother   . Hypertension Mother   . Cancer Sister   . Breast cancer Sister 53  . Breast cancer Sister 99    DRUG ALLERGIES:   Allergies  Allergen Reactions  . Penicillins     Unsure of reaction    REVIEW OF SYSTEMS:   Review of Systems  Constitutional: Negative for fever and weight loss.  HENT: Negative for congestion, nosebleeds and tinnitus.   Eyes: Negative for blurred vision, double vision and redness.  Respiratory: Negative for cough, hemoptysis and shortness of breath.   Cardiovascular: Positive for chest pain. Negative for orthopnea, leg swelling and PND.  Gastrointestinal: Negative for abdominal pain, diarrhea, melena, nausea and vomiting.  Genitourinary: Negative for dysuria, hematuria and urgency.  Musculoskeletal: Negative for falls and joint pain.  Neurological: Negative for dizziness, tingling, sensory change, focal weakness, seizures, weakness and headaches.  Endo/Heme/Allergies: Negative for polydipsia. Does not bruise/bleed easily.  Psychiatric/Behavioral: Negative for depression and memory loss. The patient is not nervous/anxious.     MEDICATIONS AT HOME:   Prior to Admission medications   Medication Sig Start Date End Date Taking? Authorizing Provider  aspirin EC 81 MG tablet Take 81 mg by mouth daily.   Yes  Historical Provider, MD  aspirin-acetaminophen-caffeine (EXCEDRIN MIGRAINE) [id number] MG per tablet Take 2 tablets by mouth every 6 (six) hours as needed for headache.   Yes Historical Provider, MD  glipiZIDE (GLUCOTROL) 10 MG tablet Take 5-10 mg by mouth 2 (two) times daily. Pt takes one tablet in the morning and one-half tablet at bedtime.   Yes Historical Provider, MD  hydrochlorothiazide (MICROZIDE) 12.5 MG capsule Take 12.5 mg by mouth daily.   Yes Historical Provider, MD  isosorbide mononitrate (IMDUR) 30 MG 24 hr tablet  Take 30 mg by mouth daily.   Yes Historical Provider, MD  linagliptin (TRADJENTA) 5 MG TABS tablet Take 5 mg by mouth daily.   Yes Historical Provider, MD  lisinopril (PRINIVIL,ZESTRIL) 20 MG tablet Take 20 mg by mouth daily.   Yes Historical Provider, MD  meloxicam (MOBIC) 15 MG tablet Take 15 mg by mouth every 8 (eight) hours as needed for pain.   Yes Historical Provider, MD  metFORMIN (GLUCOPHAGE-XR) 500 MG 24 hr tablet Take 500 mg by mouth 2 (two) times daily.   Yes Historical Provider, MD  metoprolol (LOPRESSOR) 100 MG tablet Take 100 mg by mouth 2 (two) times daily.   Yes Historical Provider, MD  ondansetron (ZOFRAN) 4 MG tablet Take 4 mg by mouth every 8 (eight) hours as needed for nausea or vomiting.   Yes Historical Provider, MD  pantoprazole (PROTONIX) 40 MG tablet Take 40 mg by mouth daily.   Yes Historical Provider, MD  rosuvastatin (CRESTOR) 20 MG tablet Take by mouth. 11/18/14 11/18/15 Yes Historical Provider, MD  sertraline (ZOLOFT) 50 MG tablet Take 50 mg by mouth daily.   Yes Historical Provider, MD      VITAL SIGNS:  Blood pressure (!) 181/85, pulse 65, temperature 98.5 F (36.9 C), temperature source Oral, resp. rate 15, height 5\' 3"  (1.6 m), weight 81.6 kg (180 lb), SpO2 96 %.  PHYSICAL EXAMINATION:  Physical Exam  GENERAL:  65 y.o.-year-old patient lying in the bed in no acute distress.  EYES: Pupils equal, round, reactive to light and accommodation. No scleral icterus. Extraocular muscles intact.  HEENT: Head atraumatic, normocephalic. Oropharynx and nasopharynx clear. No oropharyngeal erythema, moist oral mucosa  NECK:  Supple, no jugular venous distention. No thyroid enlargement, no tenderness.  LUNGS: Normal breath sounds bilaterally, no wheezing, rales, rhonchi. No use of accessory muscles of respiration.  CARDIOVASCULAR: S1, S2 RRR. No murmurs, rubs, gallops, clicks.  ABDOMEN: Soft, nontender, nondistended. Bowel sounds present. No organomegaly or mass.   EXTREMITIES: No pedal edema, cyanosis, or clubbing. + 2 pedal & radial pulses b/l.   NEUROLOGIC: Cranial nerves II through XII are intact. No focal Motor or sensory deficits appreciated b/l PSYCHIATRIC: The patient is alert and oriented x 3. Good affect.  SKIN: No obvious rash, lesion, or ulcer.   LABORATORY PANEL:   CBC  Recent Labs Lab 11/05/15 1650  WBC 8.9  HGB 14.2  HCT 41.0  PLT 235   ------------------------------------------------------------------------------------------------------------------  Chemistries   Recent Labs Lab 11/05/15 1650  NA 141  K 3.5  CL 109  CO2 25  GLUCOSE 101*  BUN 14  CREATININE 0.70  CALCIUM 9.3   ------------------------------------------------------------------------------------------------------------------  Cardiac Enzymes  Recent Labs Lab 11/05/15 1650  TROPONINI <0.03   ------------------------------------------------------------------------------------------------------------------  RADIOLOGY:  Dg Chest 2 View  Result Date: 11/05/2015 CLINICAL DATA:  Chest pain and shortness of Breath EXAM: CHEST  2 VIEW COMPARISON:  03/15/2015 FINDINGS: The heart size and mediastinal contours are within normal limits. Both  lungs are clear. The visualized skeletal structures are unremarkable. IMPRESSION: No active cardiopulmonary disease. Electronically Signed   By: Inez Catalina M.D.   On: 11/05/2015 16:35     IMPRESSION AND PLAN:   65 year old female with past medical history of coronary artery disease, hypertension, hyperlipidemia, previous history of CVA, GERD, diabetes, anxiety who presents to the hospital with chest pain.  1. Chest pain-patient does have risk factors given her diabetes, previous CVA and vascular disease. Her symptoms overall very atypical. -Her EKG shows some mild T-wave inversions in the lateral precordial leads. These are somewhat new from her previous EKG. -We'll cycle her cardiac markers, get a cardiology  consult in the a.m. Discussed with Dr. Nehemiah Massed. -Patient had a negative stress test in August of last year. -Continue aspirin, Crestor, Imdur, metoprolol  2. Diabetes type 2 without complication-continue metformin, Tradjenta, glipizide.  3. GERD-continue Protonix.  4. Hyperlipidemia-continue Crestor.  5. Depression-continue Zoloft.   All the records are reviewed and case discussed with ED provider. Management plans discussed with the patient, family and they are in agreement.  CODE STATUS: Full  TOTAL TIME TAKING CARE OF THIS PATIENT: 45 minutes.    Henreitta Leber M.D on 11/05/2015 at 8:11 PM  Between 7am to 6pm - Pager - [phone]  After 6pm go to [email or web address] - password [email or web address] [facility]  Office  [phone]  CC: Primary care physician; [facility]

## 2015-11-05 NOTE — ED Notes (Signed)
Attempting to call report at this time. 

## 2015-11-05 NOTE — ED Provider Notes (Signed)
[facility] Emergency Department Provider Note  ____________________________________________  Time seen: Approximately 4:25 PM  I have reviewed the triage vital signs and the nursing notes.   HISTORY  Chief Complaint Chest Pain    HPI Candace [name] is a 65 y.o. female who presents with chest pain that started at 1:30 PM today. It is intermittent lasting 2-3 minutes at a time, described as achiness in the center of the chest radiating to left arm, associated with shortness of breath and diaphoresis. Nausea but no vomiting. Also positive dizziness with the pain. Not exertional, not pleuritic. No cough fevers chills. No aggravating or alleviating factors.  Sees cardiology Dr. Nehemiah Massed. Last stress test or coronary evaluation was one or 2 years ago.     Past Medical History:  Diagnosis Date  . Anxiety   . Arrhythmia   . Arthritis   . Diabetes mellitus without complication ([place])   . Hyperlipidemia   . Hypertension   . Reflux   . Stroke [facility])      Patient Active Problem List   Diagnosis Date Noted  . Benign essential HTN 10/28/2014  . Episode of syncope 10/25/2014  . Carotid artery narrowing 02/08/2014  . Chest pain 02/08/2014  . Combined fat and carbohydrate induced hyperlipemia 02/08/2014  . Atypical chest pain 02/07/2014  . Arteriosclerosis of coronary artery 02/07/2014  . Essential (primary) hypertension 02/07/2014  . Awareness of heartbeats 02/07/2014  . Abdominal pain 10/05/2013  . D (diarrhea) 10/05/2013  . Acid reflux 10/05/2013     Past Surgical History:  Procedure Laterality Date  . APPENDECTOMY    . CESAREAN SECTION    . TUBAL LIGATION       Prior to Admission medications   Medication Sig Start Date End Date Taking? Authorizing Provider  aspirin EC 81 MG tablet Take 81 mg by mouth daily.   Yes Historical Provider, MD  aspirin-acetaminophen-caffeine (EXCEDRIN MIGRAINE) [phone] MG per tablet Take 2 tablets by mouth every  6 (six) hours as needed for headache.   Yes Historical Provider, MD  glipiZIDE (GLUCOTROL) 10 MG tablet Take 5-10 mg by mouth 2 (two) times daily. Pt takes one tablet in the morning and one-half tablet at bedtime.   Yes Historical Provider, MD  hydrochlorothiazide (MICROZIDE) 12.5 MG capsule Take 12.5 mg by mouth daily.   Yes Historical Provider, MD  isosorbide mononitrate (IMDUR) 30 MG 24 hr tablet Take 30 mg by mouth daily.   Yes Historical Provider, MD  linagliptin (TRADJENTA) 5 MG TABS tablet Take 5 mg by mouth daily.   Yes Historical Provider, MD  lisinopril (PRINIVIL,ZESTRIL) 20 MG tablet Take 20 mg by mouth daily.   Yes Historical Provider, MD  meloxicam (MOBIC) 15 MG tablet Take 15 mg by mouth every 8 (eight) hours as needed for pain.   Yes Historical Provider, MD  metFORMIN (GLUCOPHAGE-XR) 500 MG 24 hr tablet Take 500 mg by mouth 2 (two) times daily.   Yes Historical Provider, MD  metoprolol (LOPRESSOR) 100 MG tablet Take 100 mg by mouth 2 (two) times daily.   Yes Historical Provider, MD  ondansetron (ZOFRAN) 4 MG tablet Take 4 mg by mouth every 8 (eight) hours as needed for nausea or vomiting.   Yes Historical Provider, MD  pantoprazole (PROTONIX) 40 MG tablet Take 40 mg by mouth daily.   Yes Historical Provider, MD  rosuvastatin (CRESTOR) 20 MG tablet Take by mouth. 11/18/14 11/18/15 Yes Historical Provider, MD  sertraline (ZOLOFT) 50 MG tablet Take 50 mg  by mouth daily.   Yes Historical Provider, MD     Allergies Penicillins   Family History  Problem Relation Age of Onset  . Cancer Father   . Hypertension Father   . Heart disease Father   . Cancer Mother   . Hypertension Mother   . Cancer Sister   . Breast cancer Sister 69  . Breast cancer Sister 67    Social History Social History  Substance Use Topics  . Smoking status: Former Smoker    Types: Cigarettes    Quit date: 10/07/2001  . Smokeless tobacco: Not on file  . Alcohol use No    Review of  Systems  Constitutional:   No fever or chills.  ENT:   No sore throat. No rhinorrhea. Cardiovascular:   Positive as above chest pain. Respiratory:   Shortness of breath with chest pain. No cough. Gastrointestinal:   Negative for abdominal pain, vomiting and diarrhea.  Genitourinary:   Negative for dysuria or difficulty urinating. Musculoskeletal:   Negative for focal pain or swelling Neurological:   Negative for headaches 10-point ROS otherwise negative.  ____________________________________________   PHYSICAL EXAM:  VITAL SIGNS: ED Triage Vitals [11/05/15 1558]  Enc Vitals Group     BP 139/72     Pulse Rate 79     Resp 18     Temp 98.5 F (36.9 C)     Temp Source Oral     SpO2 97 %     Weight 180 lb (81.6 kg)     Height 5\' 3"  (1.6 m)     Head Circumference      Peak Flow      Pain Score 8     Pain Loc      Pain Edu?      Excl. in [place]?     Vital signs reviewed, nursing assessments reviewed.   Constitutional:   Alert and oriented. No distress. Eyes:   No scleral icterus. No conjunctival pallor. PERRL. EOMI.  [facility]   Head:   Normocephalic and atraumatic.   Nose:   No congestion/rhinnorhea. No septal hematoma   Mouth/Throat:   MMM, no pharyngeal erythema. No peritonsillar mass.    Neck:   No stridor. No SubQ emphysema. No meningismus. Hematological/Lymphatic/Immunilogical:   No cervical lymphadenopathy. Cardiovascular:   RRR. Symmetric bilateral radial and DP pulses.  No murmurs.  Respiratory:   Normal respiratory effort without tachypnea nor retractions. Breath sounds are clear and equal bilaterally. No wheezes/rales/rhonchi. Gastrointestinal:   Soft and nontender. Non distended. There is no CVA tenderness.  No rebound, rigidity, or guarding. Genitourinary:   deferred Musculoskeletal:   Nontender with normal range of motion in all extremities. No joint effusions.  No lower extremity tenderness.  No edema. Chest wall nontender Neurologic:   Normal speech  and language.  CN 2-10 normal. Motor grossly intact. No gross focal neurologic deficits are appreciated.  Skin:    Skin is warm, dry and intact. No rash noted.  No petechiae, purpura, or bullae.  ____________________________________________    LABS (pertinent positives/negatives) (all labs ordered are listed, but only abnormal results are displayed) Labs Reviewed  BASIC METABOLIC PANEL - Abnormal; Notable for the following:       Result Value   Glucose, Bld 101 (*)    All other components within normal limits  CBC  TROPONIN I   ____________________________________________   EKG  Interpreted by me Normal sinus rhythm rate of 75, normal axis and intervals. Poor R-wave progression in  anterior precordial leads. . T-wave inversions V3 through V6. Tube inversions in 1 and aVL. The anterior T-wave inversions appear to be new compared to 10/01/2015  ____________________________________________    RADIOLOGY  Chest x-ray unremarkable  ____________________________________________   PROCEDURES Procedures  ____________________________________________   INITIAL IMPRESSION / ASSESSMENT AND PLAN / ED COURSE  Pertinent labs & imaging results that were available during my care of the patient were reviewed by me and considered in my medical decision making (see chart for details).  Patient presents with chest pain concerning for possible cardiac etiology. She also has multiple risk factors for CAD. She also has new T-wave inversions anteriorly on her EKG. However, with her history of thoracic aortic aneurysm, also hold off on empiric treatment with heparin.    ----------------------------------------- 7:28 PM on 11/05/2015 -----------------------------------------  Troponin negative. Pain remains. Case discussed with hospitalist for admission for further management and cardiology evaluation.  Clinical Course   ____________________________________________   FINAL CLINICAL  IMPRESSION(S) / ED DIAGNOSES  Final diagnoses:  Precordial chest pain       Portions of this note were generated with dragon dictation software. Dictation errors may occur despite best attempts at proofreading.    Carrie Mew, MD 11/05/15 [phone]

## 2015-11-05 NOTE — ED Triage Notes (Signed)
States chest pain, mid chest, that began today, states dizziness and nausea, states she has been told she has blockages in the past, pain stabbing in nature, pt awake and alert in no acute distress

## 2015-11-06 ENCOUNTER — Encounter
Admission: EM | Disposition: A | Discharge status: Other Acute Inpatient Hospital | Payer: Self-pay | Source: Home / Self Care | Attending: Emergency Medicine

## 2015-11-06 HISTORY — PX: CARDIAC CATHETERIZATION: SHX172

## 2015-11-06 LAB — TROPONIN I
Troponin I: <0.03 ng/mL (ref <0.03)
Troponin I: <0.03 ng/mL (ref <0.03)
Troponin I: <0.03 ng/mL (ref <0.03)

## 2015-11-06 LAB — GLUCOSE, CAPILLARY
Glucose-Capillary: 200 mg/dL — ABNORMAL HIGH (ref 65–99)
Glucose-Capillary: 115 mg/dL — ABNORMAL HIGH (ref 65–99)
Glucose-Capillary: 154 mg/dL — ABNORMAL HIGH (ref 65–99)

## 2015-11-06 LAB — PROTIME-INR
INR: 1.09
Prothrombin Time: 14.1 seconds (ref 11.4–15.2)

## 2015-11-06 LAB — APTT: aPTT: 33 seconds (ref 24–36)

## 2015-11-06 SURGERY — LEFT HEART CATH
Anesthesia: Moderate Sedation

## 2015-11-06 MED ORDER — HEPARIN (PORCINE) IN NACL 100-0.45 UNIT/ML-% IJ SOLN
850.0000 [IU]/h | INTRAMUSCULAR | Status: DC
  Administered 2015-11-06: 850 [IU]/h via INTRAVENOUS
  Filled 2015-11-06: qty 250

## 2015-11-06 MED ORDER — ONDANSETRON 4 MG PO TBDP
4.0000 mg | ORAL_TABLET | Freq: Once | ORAL | Status: AC
  Administered 2015-11-06: 4 mg via ORAL

## 2015-11-06 MED ORDER — MORPHINE SULFATE (PF) 2 MG/ML IV SOLN
2.0000 mg | Freq: Once | INTRAVENOUS | Status: AC
  Administered 2015-11-06: 2 mg via INTRAVENOUS

## 2015-11-06 MED ORDER — ACETAMINOPHEN 325 MG PO TABS
650.0000 mg | ORAL_TABLET | ORAL | Status: DC | PRN
  Administered 2015-11-06 – 2015-11-07 (×3): 650 mg via ORAL
  Filled 2015-11-06 (×3): qty 2

## 2015-11-06 MED ORDER — SODIUM CHLORIDE 0.9% FLUSH: 3.0000 mL | Freq: Two times a day (BID) | INTRAVENOUS | Status: DC

## 2015-11-06 MED ORDER — SODIUM CHLORIDE 0.9 % WEIGHT BASED INFUSION: 3.0000 mL/kg/h | INTRAVENOUS | Status: DC

## 2015-11-06 MED ORDER — MORPHINE SULFATE (PF) 2 MG/ML IV SOLN
INTRAVENOUS | Status: AC
Start: 2015-11-06 — End: 2015-11-06
  Administered 2015-11-06: 2 mg via INTRAVENOUS
  Filled 2015-11-06: qty 1

## 2015-11-06 MED ORDER — SODIUM CHLORIDE 0.9% FLUSH
3.0000 mL | INTRAVENOUS | Status: DC | PRN
Start: 2015-11-06 — End: 2015-11-06

## 2015-11-06 MED ORDER — OXYCODONE HCL 5 MG PO TABS
5.0000 mg | ORAL_TABLET | Freq: Once | ORAL | Status: AC
  Administered 2015-11-06: 5 mg via ORAL
  Filled 2015-11-06: qty 1

## 2015-11-06 MED ORDER — SODIUM CHLORIDE 0.9% FLUSH: 3.0000 mL | INTRAVENOUS | Status: DC | PRN

## 2015-11-06 MED ORDER — ONDANSETRON 4 MG PO TBDP
ORAL_TABLET | ORAL | Status: AC
  Administered 2015-11-06: 4 mg via ORAL
  Filled 2015-11-06: qty 1

## 2015-11-06 MED ORDER — MIDAZOLAM HCL 2 MG/2ML IJ SOLN
INTRAMUSCULAR | Status: AC
  Filled 2015-11-06: qty 2

## 2015-11-06 MED ORDER — SODIUM CHLORIDE 0.9% FLUSH
3.0000 mL | Freq: Two times a day (BID) | INTRAVENOUS | Status: DC
  Administered 2015-11-06: 3 mL via INTRAVENOUS

## 2015-11-06 MED ORDER — ASPIRIN 81 MG PO CHEW: 81.0000 mg | CHEWABLE_TABLET | ORAL | Status: DC

## 2015-11-06 MED ORDER — MIDAZOLAM HCL 2 MG/2ML IJ SOLN
INTRAMUSCULAR | Status: DC | PRN
  Administered 2015-11-06: 1 mg via INTRAVENOUS

## 2015-11-06 MED ORDER — FENTANYL CITRATE (PF) 100 MCG/2ML IJ SOLN
INTRAMUSCULAR | Status: AC
  Filled 2015-11-06: qty 2

## 2015-11-06 MED ORDER — SODIUM CHLORIDE 0.9 % WEIGHT BASED INFUSION: 1.0000 mL/kg/h | INTRAVENOUS | Status: DC

## 2015-11-06 MED ORDER — HEPARIN BOLUS VIA INFUSION
4000.0000 [IU] | Freq: Once | INTRAVENOUS | Status: AC
  Administered 2015-11-06: 4000 [IU] via INTRAVENOUS
  Filled 2015-11-06: qty 4000

## 2015-11-06 MED ORDER — SODIUM CHLORIDE 0.9 % IV SOLN
INTRAVENOUS | Status: AC
  Administered 2015-11-06: 17:00:00 via INTRAVENOUS

## 2015-11-06 MED ORDER — BUTALBITAL-APAP-CAFFEINE 50-325-40 MG PO TABS
1.0000 | ORAL_TABLET | Freq: Four times a day (QID) | ORAL | Status: DC | PRN
  Administered 2015-11-06: 1 via ORAL
  Filled 2015-11-06 (×2): qty 1

## 2015-11-06 MED ORDER — HEPARIN (PORCINE) IN NACL 2-0.9 UNIT/ML-% IJ SOLN
INTRAMUSCULAR | Status: AC
  Filled 2015-11-06: qty 500

## 2015-11-06 MED ORDER — ONDANSETRON HCL 4 MG/2ML IJ SOLN: 4.0000 mg | Freq: Four times a day (QID) | INTRAMUSCULAR | Status: DC | PRN

## 2015-11-06 MED ORDER — SODIUM CHLORIDE 0.9 % IV SOLN: 250.0000 mL | INTRAVENOUS | Status: DC | PRN

## 2015-11-06 MED ORDER — INSULIN ASPART 100 UNIT/ML ~~LOC~~ SOLN
0.0000 [IU] | Freq: Three times a day (TID) | SUBCUTANEOUS | Status: DC
  Administered 2015-11-07: 2 [IU] via SUBCUTANEOUS
  Administered 2015-11-07: 3 [IU] via SUBCUTANEOUS
  Filled 2015-11-06: qty 2
  Filled 2015-11-06: qty 1
  Filled 2015-11-06: qty 3

## 2015-11-06 SURGICAL SUPPLY — 10 items
CATH INFINITI 5FR ANG PIGTAIL (CATHETERS) ×1 IMPLANT
CATH INFINITI 5FR JL4 (CATHETERS) ×1 IMPLANT
CATH INFINITI JR4 5F (CATHETERS) ×1 IMPLANT
DEVICE CLOSURE MYNXGRIP 5F (Vascular Products) ×1 IMPLANT
KIT MANI 3VAL PERCEP (MISCELLANEOUS) ×2 IMPLANT
NDL PERC 18GX7CM (NEEDLE) IMPLANT
NEEDLE PERC 18GX7CM (NEEDLE) ×2 IMPLANT
PACK CARDIAC CATH (CUSTOM PROCEDURE TRAY) ×2 IMPLANT
SHEATH PINNACLE 5F 10CM (SHEATH) ×1 IMPLANT
WIRE EMERALD 3MM-J .035X150CM (WIRE) ×1 IMPLANT

## 2015-11-06 NOTE — Consult Note (Signed)
[facility] Cardiology Consultation Note  Patient ID: Candace [name], MRN: [id number], DOB/AGE: [birth year] 65 y.o. Admit date: 11/05/2015   Date of Consult: 11/06/2015 Primary Physician: [facility] Primary Cardiologist: Nehemiah Massed  Chief Complaint:  Chief Complaint  Patient presents with  . Chest Pain   Reason for Consult: unstable angina  HPI: 65 y.o. female with known diabetes essential hypertension mixed hyperlipidemia on appropriate medication management for area artery disease and risk changes. The patient has had continued progressive episodes of weakness fatigue and shortness of breath with physical activity over the last 3-6 months of accelerating in the last month. The patient has continued to have episodes of dizziness weakness as well as nausea with and without physical activity waxing and waning possibly consistent with anginal equivalent. The patient has not had any evidence of myocardial infarction although has an abnormal EKG showing normal sinus rhythm with ST depression and T-wave inversion in the anterior precordial and lateral leads. The patient does have a normal troponin at this time. Currently she has a telemetry showing normal sinus rhythm and has had no further episodes of chest pain or symptoms. Risk factor management with high intensity cholesterol therapy hypertension control with metoprolol and lisinopril hydrochlorothiazide and aspirin  Past Medical History:  Diagnosis Date  . Anxiety   . Arrhythmia   . Arthritis   . Diabetes mellitus without complication ([place])   . Hyperlipidemia   . Hypertension   . Reflux   . Stroke [facility])       Surgical History:  Past Surgical History:  Procedure Laterality Date  . APPENDECTOMY    . CESAREAN SECTION    . TUBAL LIGATION       Home Meds: Prior to Admission medications   Medication Sig Start Date End Date Taking? Authorizing Provider  aspirin EC 81 MG tablet Take 81 mg by mouth daily.   Yes  Historical Provider, MD  aspirin-acetaminophen-caffeine (EXCEDRIN MIGRAINE) [phone] MG per tablet Take 2 tablets by mouth every 6 (six) hours as needed for headache.   Yes Historical Provider, MD  glipiZIDE (GLUCOTROL) 10 MG tablet Take 5-10 mg by mouth 2 (two) times daily. Pt takes one tablet in the morning and one-half tablet at bedtime.   Yes Historical Provider, MD  linagliptin (TRADJENTA) 5 MG TABS tablet Take 5 mg by mouth daily.   Yes Historical Provider, MD  meloxicam (MOBIC) 15 MG tablet Take 15 mg by mouth every 8 (eight) hours as needed for pain.   Yes Historical Provider, MD  metFORMIN (GLUCOPHAGE-XR) 500 MG 24 hr tablet Take 500 mg by mouth 2 (two) times daily.   Yes Historical Provider, MD  metoprolol (LOPRESSOR) 100 MG tablet Take 100 mg by mouth 2 (two) times daily.   Yes Historical Provider, MD  ondansetron (ZOFRAN) 4 MG tablet Take 4 mg by mouth every 8 (eight) hours as needed for nausea or vomiting.   Yes Historical Provider, MD  pantoprazole (PROTONIX) 40 MG tablet Take 40 mg by mouth daily.   Yes Historical Provider, MD  rosuvastatin (CRESTOR) 20 MG tablet Take by mouth. 11/18/14 11/18/15 Yes Historical Provider, MD  sertraline (ZOLOFT) 50 MG tablet Take 50 mg by mouth daily.   Yes Historical Provider, MD  hydrochlorothiazide (MICROZIDE) 12.5 MG capsule Take 12.5 mg by mouth daily.    Historical Provider, MD  isosorbide mononitrate (IMDUR) 30 MG 24 hr tablet Take 30 mg by mouth daily.    Historical Provider, MD  lisinopril (PRINIVIL,ZESTRIL) 20 MG  tablet Take 20 mg by mouth daily.    Historical Provider, MD    Inpatient Medications:  . aspirin EC  81 mg Oral Daily  . enoxaparin (LOVENOX) injection  40 mg Subcutaneous Q24H  . glipiZIDE  5-10 mg Oral BID  . hydrochlorothiazide  12.5 mg Oral Daily  . isosorbide mononitrate  30 mg Oral Daily  . linagliptin  5 mg Oral Daily  . lisinopril  20 mg Oral Daily  . metFORMIN  500 mg Oral BID WC  . metoprolol  100 mg Oral BID  .  pantoprazole  40 mg Oral Daily  . rosuvastatin  20 mg Oral Daily  . sertraline  50 mg Oral Daily      Allergies:  Allergies  Allergen Reactions  . Penicillins     Unsure of reaction    Social History   Social History  . Marital status: Divorced    Spouse name: N/A  . Number of children: N/A  . Years of education: N/A   Occupational History  . Not on file.   Social History Main Topics  . Smoking status: Former Smoker    Types: Cigarettes    Quit date: 10/07/2001  . Smokeless tobacco: Never Used  . Alcohol use No  . Drug use: Unknown  . Sexual activity: Not Currently   Other Topics Concern  . Not on file   Social History Narrative  . No narrative on file     Family History  Problem Relation Age of Onset  . Cancer Father   . Hypertension Father   . Heart disease Father   . Cancer Mother   . Hypertension Mother   . Cancer Sister   . Breast cancer Sister 52  . Breast cancer Sister 65     Review of Systems Positive for Shortness of breath weakness fatigue dizziness Negative for: General:  chills, fever, night sweats or weight changes.  Cardiovascular: PND orthopnea syncope positive for dizziness  Dermatological skin lesions rashes Respiratory: Cough congestion Urologic: Frequent urination urination at night and hematuria Abdominal: negative for  vomiting, diarrhea, bright red blood per rectum, melena, or hematemesis Neurologic: negative for visual changes, and/or hearing changes  All other systems reviewed and are otherwise negative except as noted above.  Labs:  Recent Labs  11/05/15 1650 11/05/15 2220 11/06/15 0220 11/06/15 0542  TROPONINI <0.03 <0.03 <0.03 <0.03   Lab Results  Component Value Date   WBC 8.9 11/05/2015   HGB 14.2 11/05/2015   HCT 41.0 11/05/2015   MCV 93.1 11/05/2015   PLT 235 11/05/2015    Recent Labs Lab 11/05/15 1650  NA 141  K 3.5  CL 109  CO2 25  BUN 14  CREATININE 0.70  CALCIUM 9.3  GLUCOSE 101*   Lab  Results  Component Value Date   CHOL 214 (H) 07/14/2013   HDL 36 (L) 07/14/2013   LDLCALC 154 (H) 07/14/2013   TRIG 118 07/14/2013   No results found for: DDIMER  Radiology/Studies:  Dg Chest 2 View  Result Date: 11/05/2015 CLINICAL DATA:  Chest pain and shortness of Breath EXAM: CHEST  2 VIEW COMPARISON:  03/15/2015 FINDINGS: The heart size and mediastinal contours are within normal limits. Both lungs are clear. The visualized skeletal structures are unremarkable. IMPRESSION: No active cardiopulmonary disease. Electronically Signed   By: Inez Catalina M.D.   On: 11/05/2015 16:35    EKG: Normal sinus rhythm with the lateral and anterior precordial T-wave inversion  Weights: [employer]  11/05/15 1558  Weight: 180 lb (81.6 kg)     Physical Exam: Blood pressure 129/65, pulse 62, temperature 97.6 F (36.4 C), temperature source Oral, resp. rate 14, height 5\' 3"  (1.6 m), weight 180 lb (81.6 kg), SpO2 95 %. Body mass index is 31.89 kg/m. General: Well developed, well nourished, in no acute distress. Head eyes ears nose throat: Normocephalic, atraumatic, sclera non-icteric, no xanthomas, nares are without discharge. No apparent thyromegaly and/or mass  Lungs: Normal respiratory effort.  no wheezes, no rales, no rhonchi.  Heart: RRR with normal S1 S2. no murmur gallop, no rub, PMI is normal size and placement, carotid upstroke normal without bruit, jugular venous pressure is normal Abdomen: Soft, non-tender, non-distended with normoactive bowel sounds. No hepatomegaly. No rebound/guarding. No obvious abdominal masses. Abdominal aorta is normal size without bruit Extremities: No edema. no cyanosis, no clubbing, no ulcers  Peripheral : 2+ bilateral upper extremity pulses, 2+ bilateral femoral pulses, 2+ bilateral dorsal pedal pulse Neuro: Alert and oriented. No facial asymmetry. No focal deficit. Moves all extremities spontaneously. Musculoskeletal: Normal muscle tone without  kyphosis Psych:  Responds to questions appropriately with a normal affect.    Assessment: 65 year old female with diabetes hypertension hyperlipidemia on appropriate medication management with progressive the [place] class IV angina and anginal equivalent without current evidence of myocardial infarction  Plan: 1. Continue high intensity cholesterol therapy with Crestor 2. Metoprolol lisinopril isosorbide for hypertension control and further risk reduction cardiovascular event 3. Aspirin for further risk reduction of cardiovascular disease 4. Proceed to cardiac catheterization to assess coronary anatomy and further treatment thereof is necessary. Patient understands risk and benefits of cardiac catheterization. This includes a possibility does stroke heart attack infection bleeding or blood clot. She is at low risk for conscious sedation  Signed, Corey Skains M.D. [facility] 11/06/2015, 8:07 AM

## 2015-11-06 NOTE — Progress Notes (Signed)
[facility] [place] NAME: Candace [name]    MR#:  [id number]  DATE OF BIRTH:  [birth year]  SUBJECTIVE: Admitted for chest pressure, shortness of breath, nausea and dizziness. Berneta Sages for cardiac catheter tomorrow. Patient still has some left-sided chest discomfort, headache. EKG shows sinus rhythm with ST depression and T-wave inversion in anterior leads on admission.   CHIEF COMPLAINT:   Chief Complaint  Patient presents with  . Chest Pain    REVIEW OF SYSTEMS:    Review of Systems  Constitutional: Negative for chills and fever.  HENT: Negative for hearing loss.   Eyes: Negative for blurred vision, double vision and photophobia.  Respiratory: Negative for cough, hemoptysis and shortness of breath.   Cardiovascular: Positive for chest pain. Negative for palpitations, orthopnea and leg swelling.  Gastrointestinal: Negative for abdominal pain, diarrhea and vomiting.  Genitourinary: Negative for dysuria and urgency.  Musculoskeletal: Negative for myalgias and neck pain.  Skin: Negative for rash.  Neurological: Negative for dizziness, focal weakness, seizures, weakness and headaches.  Psychiatric/Behavioral: Negative for memory loss. The patient does not have insomnia.     Nutrition:Tolerating Diet: Tolerating PT:      DRUG ALLERGIES:   Allergies  Allergen Reactions  . Penicillins     Unsure of reaction    VITALS:  Blood pressure (!) 146/58, pulse 61, temperature 97.3 F (36.3 C), temperature source Oral, resp. rate 14, height 5\' 3"  (1.6 m), weight 81.6 kg (180 lb), SpO2 94 %.  PHYSICAL EXAMINATION:   Physical Exam  GENERAL:  65 y.o.-year-old patient lying in the bed with no acute distress.  EYES: Pupils equal, round, reactive to light and accommodation. No scleral icterus. Extraocular muscles intact.  HEENT: Head atraumatic, normocephalic. Oropharynx and nasopharynx clear.  NECK:  Supple, no jugular venous distention.  No thyroid enlargement, no tenderness.  LUNGS: Normal breath sounds bilaterally, no wheezing, rales,rhonchi or crepitation. No use of accessory muscles of respiration.  CARDIOVASCULAR: S1, S2 normal. No murmurs, rubs, or gallops.  ABDOMEN: Soft, nontender, nondistended. Bowel sounds present. No organomegaly or mass.  EXTREMITIES: No pedal edema, cyanosis, or clubbing.  NEUROLOGIC: Cranial nerves II through XII are intact. Muscle strength 5/5 in all extremities. Sensation intact. Gait not checked.  PSYCHIATRIC: The patient is alert and oriented x 3.  SKIN: No obvious rash, lesion, or ulcer.    LABORATORY PANEL:   CBC  Recent Labs Lab 11/05/15 1650  WBC 8.9  HGB 14.2  HCT 41.0  PLT 235   ------------------------------------------------------------------------------------------------------------------  Chemistries   Recent Labs Lab 11/05/15 1650  NA 141  K 3.5  CL 109  CO2 25  GLUCOSE 101*  BUN 14  CREATININE 0.70  CALCIUM 9.3   ------------------------------------------------------------------------------------------------------------------  Cardiac Enzymes  Recent Labs Lab 11/06/15 0542  TROPONINI <0.03   ------------------------------------------------------------------------------------------------------------------  RADIOLOGY:  Dg Chest 2 View  Result Date: 11/05/2015 CLINICAL DATA:  Chest pain and shortness of Breath EXAM: CHEST  2 VIEW COMPARISON:  03/15/2015 FINDINGS: The heart size and mediastinal contours are within normal limits. Both lungs are clear. The visualized skeletal structures are unremarkable. IMPRESSION: No active cardiopulmonary disease. Electronically Signed   By: Inez Catalina M.D.   On: 11/05/2015 16:35     ASSESSMENT AND PLAN:   Active Problems:   Chest pain   #1 chest pain, shortness of breath, dizziness symptoms are consistent with angina: Patient scheduled to have cardiac catheter tomorrow morning. Continue nothing by mouth after  midnight, IV  hydration, hold metformin. Troponins are negative for tinnitus. Monitor on telemetry. Continue metoprolol, Imdur, Crestor, lisinopril. Continue aspirin as well. Appreciate cardiology following the patient. #2 diabetes mellitus type hold tomorrow a.m. dose metformin. And  Glipizide. #3 headache secondary to nitroglycerin: Use fiorcet,has h/o migraines Essential hypertension: Controlled History of stroke:   All the records are reviewed and case discussed with Care Management/Social Workerr. Management plans discussed with the patient, family and they are in agreement.  CODE STATUS: full code  TOTAL TIME TAKING CARE OF THIS PATIENT: 35 minutes.   POSSIBLE D/C IN 1-2DAYS, DEPENDING ON CLINICAL CONDITION.   Epifanio Lesches M.D on 11/06/2015 at 11:33 AM  Between 7am to 6pm - Pager - [phone]  After 6pm go to [email or web address] - password [email or web address]  Office  [phone]  CC: Primary care physician; [facility]

## 2015-11-06 NOTE — Progress Notes (Signed)
Nausea is much worse after any movement - pulling herself up in bed, or walking to BR. Treating with Zofran.

## 2015-11-06 NOTE — Progress Notes (Signed)
Returned from Cardiac Cath. Right groin site is clean and dry, skin around the site is soft. She denies pain.  Nauses much improved from this morning.

## 2015-11-06 NOTE — Progress Notes (Signed)
[facility] Encounter Note  Patient: Candace [name] / Admit Date: 11/05/2015 / Date of Encounter: 11/06/2015, 3:39 PM   Subjective: Patient has had recurrent episodes of chest discomfort and some hypotension with weakness fatigue and dizziness. No evidence of myocardial infarction with normal troponin. EKG continues to be with normal sinus rhythm with anterolateral ST changes Cardiac catheterization shows normal LV systolic function with ejection fraction of 60% Coronary anatomy shows to significant proximal left anterior descending artery stenosis of 75 and 80% with an 85% ramus and a 90% circumflex stenosis likely causing the patient's current symptoms of unstable angina  Review of Systems: Positive for: This pain shortness of breath Negative for: Vision change, hearing change, syncope, dizziness, nausea, vomiting,diarrhea, bloody stool, stomach pain, cough, congestion, diaphoresis, urinary frequency, urinary pain,skin lesions, skin rashes Others previously listed  Objective: Telemetry: Normal sinus rhythm Physical Exam: Blood pressure (!) 95/57, pulse 68, temperature 98.4 F (36.9 C), temperature source Oral, resp. rate 12, height 5\' 3"  (1.6 m), weight 180 lb (81.6 kg), SpO2 95 %. Body mass index is 31.89 kg/m. General: Well developed, well nourished, in no acute distress. Head: Normocephalic, atraumatic, sclera non-icteric, no xanthomas, nares are without discharge. Neck: No apparent masses Lungs: Normal respirations with no wheezes, no rhonchi, no rales , no crackles   Heart: Regular rate and rhythm, normal S1 S2, no murmur, no rub, no gallop, PMI is normal size and placement, carotid upstroke normal without bruit, jugular venous pressure normal Abdomen: Soft, non-tender, non-distended with normoactive bowel sounds. No hepatosplenomegaly. Abdominal aorta is normal size without bruit Extremities: No edema, no clubbing, no cyanosis, no ulcers,  Peripheral: 2+  radial, 2+ femoral, 2+ dorsal pedal pulses Neuro: Alert and oriented. Moves all extremities spontaneously. Psych:  Responds to questions appropriately with a normal affect.   Intake/Output Summary (Last 24 hours) at 11/06/15 1539 Last data filed at 11/06/15 0900  Gross per 24 hour  Intake              220 ml  Output                0 ml  Net              220 ml    Inpatient Medications:  . [START ON 11/07/2015] aspirin  81 mg Oral Pre-Cath  . [MAR Hold] aspirin EC  81 mg Oral Daily  . [MAR Hold] enoxaparin (LOVENOX) injection  40 mg Subcutaneous Q24H  . [MAR Hold] hydrochlorothiazide  12.5 mg Oral Daily  . [MAR Hold] insulin aspart  0-9 Units Subcutaneous TID WC  . [MAR Hold] isosorbide mononitrate  30 mg Oral Daily  . [MAR Hold] linagliptin  5 mg Oral Daily  . [MAR Hold] lisinopril  20 mg Oral Daily  . [MAR Hold] metoprolol  100 mg Oral BID  . [MAR Hold] pantoprazole  40 mg Oral Daily  . [MAR Hold] rosuvastatin  20 mg Oral Daily  . [MAR Hold] sertraline  50 mg Oral Daily  . sodium chloride flush  3 mL Intravenous Q12H   Infusions:  . [START ON 11/07/2015] sodium chloride     Followed by  . [START ON 11/07/2015] sodium chloride      Labs:  Recent Labs  11/05/15 1650  NA 141  K 3.5  CL 109  CO2 25  GLUCOSE 101*  BUN 14  CREATININE 0.70  CALCIUM 9.3   No results for input(s): AST, ALT, ALKPHOS, BILITOT, PROT, ALBUMIN in the  last 72 hours.  Recent Labs  11/05/15 1650  WBC 8.9  HGB 14.2  HCT 41.0  MCV 93.1  PLT 235    Recent Labs  11/05/15 1650 11/05/15 2220 11/06/15 0220 11/06/15 0542  TROPONINI <0.03 <0.03 <0.03 <0.03   Invalid input(s): POCBNP No results for input(s): HGBA1C in the last 72 hours.   Weights: Filed Weights   11/05/15 1558  Weight: 180 lb (81.6 kg)     Radiology/Studies:  Dg Chest 2 View  Result Date: 11/05/2015 CLINICAL DATA:  Chest pain and shortness of Breath EXAM: CHEST  2 VIEW COMPARISON:  03/15/2015 FINDINGS: The heart  size and mediastinal contours are within normal limits. Both lungs are clear. The visualized skeletal structures are unremarkable. IMPRESSION: No active cardiopulmonary disease. Electronically Signed   By: Inez Catalina M.D.   On: 11/05/2015 16:35     Assessment and Recommendation  65 y.o. female with known diabetes essential hypertension mixed hyperlipidemia on appropriate medication management for further risk reduction with progressive episodes of shortness of breath chest pain consistent with [place] class for anginal equivalent and no current evidence of myocardial infarction. Cardiac catheterization shows significant three-vessel coronary artery disease including the left anterior descending artery 1. Continue medication management including heparin for further risk reduction of the myocardial infarction 2. Beta blocker ACE inhibitor for hypertension control 3. High intensity cholesterol therapy 4. Proceed to transfer for three-vessel coronary artery bypass grafting due to significant three-vessel coronary artery disease symptoms of [place] class IV angina with concomitant high syntax score and diabetes  Signed, Serafina Royals M.D. FACC

## 2015-11-06 NOTE — Consult Note (Signed)
ANTICOAGULATION CONSULT NOTE - Initial Consult  Pharmacy Consult for heparin drip Indication: chest pain/ACS  Allergies  Allergen Reactions  . Penicillins     Unsure of reaction    Patient Measurements: Height: 5\' 3"  (160 cm) Weight: 180 lb (81.6 kg) IBW/kg (Calculated) : 52.4 Heparin Dosing Weight: 70.3kg  Vital Signs: Temp: 98.5 F (36.9 C) (08/10 1929) Temp Source: Oral (08/10 1929) BP: 117/51 (08/10 1929) Pulse Rate: 64 (08/10 1929)  Labs:  Recent Labs  11/05/15 1650 11/05/15 2220 11/06/15 0220 11/06/15 0542 11/06/15 1728  HGB 14.2  --   --   --   --   HCT 41.0  --   --   --   --   PLT 235  --   --   --   --   APTT  --   --   --   --  33  LABPROT  --   --   --   --  14.1  INR  --   --   --   --  1.09  CREATININE 0.70  --   --   --   --   TROPONINI <0.03 <0.03 <0.03 <0.03  --     Estimated Creatinine Clearance: 71.9 mL/min (by C-G formula based on SCr of 0.8 mg/dL).   Medical History: Past Medical History:  Diagnosis Date  . Anxiety   . Arrhythmia   . Arthritis   . Diabetes mellitus without complication ([place])   . Hyperlipidemia   . Hypertension   . Reflux   . Stroke [facility])     Medications:  Scheduled:  . aspirin EC  81 mg Oral Daily  . hydrochlorothiazide  12.5 mg Oral Daily  . insulin aspart  0-9 Units Subcutaneous TID WC  . isosorbide mononitrate  30 mg Oral Daily  . linagliptin  5 mg Oral Daily  . lisinopril  20 mg Oral Daily  . metoprolol  100 mg Oral BID  . pantoprazole  40 mg Oral Daily  . rosuvastatin  20 mg Oral Daily  . sertraline  50 mg Oral Daily    Assessment: Pt is a 65 year old female who underwent cardiac cath today. She is to undergo triple bypass surgery. Cardiology would like tx medically with heparin drip in the mean time. Drip to start 8 hours after sheath removal. Per RN in specials, sheath removed at 1600. Heparin drip to start at 0000.  Goal of Therapy:  Heparin level 0.3-0.7 units/ml Monitor platelets by  anticoagulation protocol: Yes   Plan:  Give 4000 units bolus x 1 Start heparin infusion at 850 units/hr Check anti-Xa level in 6 hours and daily while on heparin Continue to monitor H&H and platelets  Bijan Ridgley D Demya Scruggs 11/06/2015,7:55 PM

## 2015-11-07 ENCOUNTER — Inpatient Hospital Stay (HOSPITAL_COMMUNITY)
Admission: AD | Admit: 2015-11-07 | Discharge: 2015-11-18 | DRG: 235 | Disposition: A | Discharge status: Home Health | Payer: BLUE CROSS/BLUE SHIELD | Source: Other Acute Inpatient Hospital | Attending: Cardiothoracic Surgery | Admitting: Cardiothoracic Surgery

## 2015-11-07 ENCOUNTER — Encounter: Payer: Self-pay | Admitting: Internal Medicine

## 2015-11-07 ENCOUNTER — Other Ambulatory Visit: Payer: Self-pay | Admitting: *Deleted

## 2015-11-07 DIAGNOSIS — Z951 Presence of aortocoronary bypass graft: Secondary | ICD-10-CM

## 2015-11-07 DIAGNOSIS — J9621 Acute and chronic respiratory failure with hypoxia: Secondary | ICD-10-CM | POA: Diagnosis not present

## 2015-11-07 DIAGNOSIS — J9811 Atelectasis: Secondary | ICD-10-CM

## 2015-11-07 DIAGNOSIS — J449 Chronic obstructive pulmonary disease, unspecified: Secondary | ICD-10-CM | POA: Diagnosis present

## 2015-11-07 DIAGNOSIS — J9601 Acute respiratory failure with hypoxia: Secondary | ICD-10-CM | POA: Diagnosis not present

## 2015-11-07 DIAGNOSIS — R0902 Hypoxemia: Secondary | ICD-10-CM | POA: Diagnosis not present

## 2015-11-07 DIAGNOSIS — Z79899 Other long term (current) drug therapy: Secondary | ICD-10-CM

## 2015-11-07 DIAGNOSIS — R55 Syncope and collapse: Secondary | ICD-10-CM | POA: Diagnosis not present

## 2015-11-07 DIAGNOSIS — I2510 Atherosclerotic heart disease of native coronary artery without angina pectoris: Secondary | ICD-10-CM | POA: Diagnosis present

## 2015-11-07 DIAGNOSIS — I959 Hypotension, unspecified: Secondary | ICD-10-CM | POA: Diagnosis not present

## 2015-11-07 DIAGNOSIS — E876 Hypokalemia: Secondary | ICD-10-CM | POA: Diagnosis not present

## 2015-11-07 DIAGNOSIS — J9620 Acute and chronic respiratory failure, unspecified whether with hypoxia or hypercapnia: Secondary | ICD-10-CM | POA: Diagnosis not present

## 2015-11-07 DIAGNOSIS — D72829 Elevated white blood cell count, unspecified: Secondary | ICD-10-CM | POA: Diagnosis present

## 2015-11-07 DIAGNOSIS — Z01810 Encounter for preprocedural cardiovascular examination: Secondary | ICD-10-CM | POA: Diagnosis not present

## 2015-11-07 DIAGNOSIS — J441 Chronic obstructive pulmonary disease with (acute) exacerbation: Secondary | ICD-10-CM | POA: Diagnosis not present

## 2015-11-07 DIAGNOSIS — D62 Acute posthemorrhagic anemia: Secondary | ICD-10-CM | POA: Diagnosis not present

## 2015-11-07 DIAGNOSIS — R0600 Dyspnea, unspecified: Secondary | ICD-10-CM

## 2015-11-07 DIAGNOSIS — Z09 Encounter for follow-up examination after completed treatment for conditions other than malignant neoplasm: Secondary | ICD-10-CM

## 2015-11-07 DIAGNOSIS — Z8673 Personal history of transient ischemic attack (TIA), and cerebral infarction without residual deficits: Secondary | ICD-10-CM

## 2015-11-07 DIAGNOSIS — I10 Essential (primary) hypertension: Secondary | ICD-10-CM | POA: Diagnosis present

## 2015-11-07 DIAGNOSIS — I25110 Atherosclerotic heart disease of native coronary artery with unstable angina pectoris: Secondary | ICD-10-CM | POA: Diagnosis not present

## 2015-11-07 DIAGNOSIS — E785 Hyperlipidemia, unspecified: Secondary | ICD-10-CM | POA: Diagnosis present

## 2015-11-07 DIAGNOSIS — F0390 Unspecified dementia without behavioral disturbance: Secondary | ICD-10-CM | POA: Diagnosis present

## 2015-11-07 DIAGNOSIS — D638 Anemia in other chronic diseases classified elsewhere: Secondary | ICD-10-CM | POA: Diagnosis present

## 2015-11-07 DIAGNOSIS — F419 Anxiety disorder, unspecified: Secondary | ICD-10-CM | POA: Diagnosis present

## 2015-11-07 DIAGNOSIS — M1990 Unspecified osteoarthritis, unspecified site: Secondary | ICD-10-CM | POA: Diagnosis present

## 2015-11-07 DIAGNOSIS — J9801 Acute bronchospasm: Secondary | ICD-10-CM | POA: Diagnosis not present

## 2015-11-07 DIAGNOSIS — I447 Left bundle-branch block, unspecified: Secondary | ICD-10-CM | POA: Diagnosis present

## 2015-11-07 DIAGNOSIS — Z87891 Personal history of nicotine dependence: Secondary | ICD-10-CM

## 2015-11-07 DIAGNOSIS — R079 Chest pain, unspecified: Secondary | ICD-10-CM | POA: Diagnosis present

## 2015-11-07 DIAGNOSIS — K219 Gastro-esophageal reflux disease without esophagitis: Secondary | ICD-10-CM | POA: Diagnosis present

## 2015-11-07 DIAGNOSIS — Z7984 Long term (current) use of oral hypoglycemic drugs: Secondary | ICD-10-CM

## 2015-11-07 DIAGNOSIS — J45909 Unspecified asthma, uncomplicated: Secondary | ICD-10-CM | POA: Diagnosis present

## 2015-11-07 DIAGNOSIS — E119 Type 2 diabetes mellitus without complications: Secondary | ICD-10-CM | POA: Diagnosis present

## 2015-11-07 DIAGNOSIS — Z9689 Presence of other specified functional implants: Secondary | ICD-10-CM

## 2015-11-07 LAB — COMPREHENSIVE METABOLIC PANEL
ALT: 11 U/L — ABNORMAL LOW (ref 14–54)
AST: 16 U/L (ref 15–41)
Albumin: 3.3 g/dL — ABNORMAL LOW (ref 3.5–5.0)
Alkaline Phosphatase: 77 U/L (ref 38–126)
Anion gap: 9 (ref 5–15)
BUN: 12 mg/dL (ref 6–20)
CO2: 21 mmol/L — ABNORMAL LOW (ref 22–32)
Calcium: 8.6 mg/dL — ABNORMAL LOW (ref 8.9–10.3)
Chloride: 109 mmol/L (ref 101–111)
Creatinine, Ser: 0.76 mg/dL (ref 0.44–1.00)
GFR calc Af Amer: >60 mL/min (ref >60)
GFR calc non Af Amer: >60 mL/min (ref >60)
Glucose, Bld: 147 mg/dL — ABNORMAL HIGH (ref 65–99)
Potassium: 3.9 mmol/L (ref 3.5–5.1)
Sodium: 139 mmol/L (ref 135–145)
Total Bilirubin: 0.3 mg/dL (ref 0.3–1.2)
Total Protein: 5.8 g/dL — ABNORMAL LOW (ref 6.5–8.1)

## 2015-11-07 LAB — HEPARIN LEVEL (UNFRACTIONATED)
Heparin Unfractionated: 0.41 IU/mL (ref 0.30–0.70)
Heparin Unfractionated: 0.36 IU/mL (ref 0.30–0.70)

## 2015-11-07 LAB — CBC WITH DIFFERENTIAL/PLATELET
Basophils Absolute: 0 K/uL (ref 0.0–0.1)
Basophils Relative: 0 %
Eosinophils Absolute: 0.1 K/uL (ref 0.0–0.7)
Eosinophils Relative: 2 %
HCT: 35.1 % — ABNORMAL LOW (ref 36.0–46.0)
Hemoglobin: 11.2 g/dL — ABNORMAL LOW (ref 12.0–15.0)
Lymphocytes Relative: 48 %
Lymphs Abs: 3.8 K/uL (ref 0.7–4.0)
MCH: 30.7 pg (ref 26.0–34.0)
MCHC: 31.9 g/dL (ref 30.0–36.0)
MCV: 96.2 fL (ref 78.0–100.0)
Monocytes Absolute: 0.5 K/uL (ref 0.1–1.0)
Monocytes Relative: 6 %
Neutro Abs: 3.5 K/uL (ref 1.7–7.7)
Neutrophils Relative %: 44 %
Platelets: 170 K/uL (ref 150–400)
RBC: 3.65 MIL/uL — ABNORMAL LOW (ref 3.87–5.11)
RDW: 12.4 % (ref 11.5–15.5)
WBC: 8 K/uL (ref 4.0–10.5)

## 2015-11-07 LAB — CBC
HCT: 37.9 % (ref 35.0–47.0)
Hemoglobin: 12.9 g/dL (ref 12.0–16.0)
MCH: 31.6 pg (ref 26.0–34.0)
MCHC: 34 g/dL (ref 32.0–36.0)
MCV: 93 fL (ref 80.0–100.0)
Platelets: 201 10*3/uL (ref 150–440)
RBC: 4.08 MIL/uL (ref 3.80–5.20)
RDW: 13 % (ref 11.5–14.5)
WBC: 7.7 10*3/uL (ref 3.6–11.0)

## 2015-11-07 LAB — GLUCOSE, CAPILLARY
Glucose-Capillary: 121 mg/dL — ABNORMAL HIGH (ref 65–99)
Glucose-Capillary: 207 mg/dL — ABNORMAL HIGH (ref 65–99)
Glucose-Capillary: 129 mg/dL — ABNORMAL HIGH (ref 65–99)
Glucose-Capillary: 158 mg/dL — ABNORMAL HIGH (ref 65–99)

## 2015-11-07 LAB — SURGICAL PCR SCREEN
MRSA, PCR: NEGATIVE
Staphylococcus aureus: NEGATIVE

## 2015-11-07 LAB — APTT: aPTT: 84 seconds — ABNORMAL HIGH (ref 24–36)

## 2015-11-07 LAB — TROPONIN I: Troponin I: <0.03 ng/mL (ref <0.03)

## 2015-11-07 LAB — PROTIME-INR
INR: 1.09
Prothrombin Time: 14.2 s (ref 11.4–15.2)

## 2015-11-07 MED ORDER — SODIUM CHLORIDE 0.9 % IV SOLN
INTRAVENOUS | Status: DC
  Administered 2015-11-07: 13:00:00 via INTRAVENOUS

## 2015-11-07 MED ORDER — ACETAMINOPHEN 325 MG PO TABS
650.0000 mg | ORAL_TABLET | ORAL | Status: DC | PRN
  Administered 2015-11-07 – 2015-11-09 (×2): 650 mg via ORAL
  Filled 2015-11-07 (×2): qty 2

## 2015-11-07 MED ORDER — METOPROLOL TARTRATE 50 MG PO TABS
100.0000 mg | ORAL_TABLET | Freq: Two times a day (BID) | ORAL | Status: DC
  Administered 2015-11-07 – 2015-11-09 (×5): 100 mg via ORAL
  Filled 2015-11-07 (×5): qty 2

## 2015-11-07 MED ORDER — METOPROLOL TARTRATE 25 MG PO TABS: 25.0000 mg | ORAL_TABLET | Freq: Two times a day (BID) | ORAL | Status: DC

## 2015-11-07 MED ORDER — METFORMIN HCL ER 500 MG PO TB24
500.0000 mg | ORAL_TABLET | Freq: Two times a day (BID) | ORAL | Status: DC
  Administered 2015-11-08 – 2015-11-09 (×3): 500 mg via ORAL
  Filled 2015-11-07 (×6): qty 1

## 2015-11-07 MED ORDER — ROSUVASTATIN CALCIUM 10 MG PO TABS
40.0000 mg | ORAL_TABLET | Freq: Every day | ORAL | Status: DC
  Administered 2015-11-08 – 2015-11-17 (×9): 40 mg via ORAL
  Filled 2015-11-07: qty 2
  Filled 2015-11-07 (×9): qty 4
  Filled 2015-11-07: qty 2
  Filled 2015-11-07: qty 4

## 2015-11-07 MED ORDER — NITROGLYCERIN IN D5W 200-5 MCG/ML-% IV SOLN
0.0000 ug/min | INTRAVENOUS | Status: DC
  Administered 2015-11-07: 12 ug/min via INTRAVENOUS
  Administered 2015-11-08: 30 ug/min via INTRAVENOUS
  Filled 2015-11-07 (×2): qty 250

## 2015-11-07 MED ORDER — GLIPIZIDE 5 MG PO TABS: 5.0000 mg | ORAL_TABLET | Freq: Two times a day (BID) | ORAL | Status: DC

## 2015-11-07 MED ORDER — LISINOPRIL 10 MG PO TABS
20.0000 mg | ORAL_TABLET | Freq: Every day | ORAL | Status: DC
  Administered 2015-11-08: 20 mg via ORAL
  Filled 2015-11-07: qty 2

## 2015-11-07 MED ORDER — SERTRALINE HCL 50 MG PO TABS
50.0000 mg | ORAL_TABLET | Freq: Every day | ORAL | Status: DC
  Administered 2015-11-08 – 2015-11-18 (×10): 50 mg via ORAL
  Filled 2015-11-07 (×10): qty 1

## 2015-11-07 MED ORDER — HEPARIN (PORCINE) IN NACL 100-0.45 UNIT/ML-% IJ SOLN
850.0000 [IU]/h | INTRAMUSCULAR | Status: DC
  Administered 2015-11-07 – 2015-11-09 (×3): 850 [IU]/h via INTRAVENOUS
  Filled 2015-11-07 (×2): qty 250

## 2015-11-07 MED ORDER — ONDANSETRON HCL 4 MG/2ML IJ SOLN
4.0000 mg | Freq: Four times a day (QID) | INTRAMUSCULAR | Status: DC | PRN
  Administered 2015-11-08 – 2015-11-09 (×2): 4 mg via INTRAVENOUS
  Filled 2015-11-07 (×2): qty 2

## 2015-11-07 MED ORDER — CETYLPYRIDINIUM CHLORIDE 0.05 % MT LIQD
7.0000 mL | Freq: Two times a day (BID) | OROMUCOSAL | Status: DC
  Administered 2015-11-07 – 2015-11-09 (×5): 7 mL via OROMUCOSAL

## 2015-11-07 MED ORDER — ASPIRIN EC 81 MG PO TBEC
81.0000 mg | DELAYED_RELEASE_TABLET | Freq: Every day | ORAL | Status: DC
  Administered 2015-11-08 – 2015-11-09 (×2): 81 mg via ORAL
  Filled 2015-11-07 (×2): qty 1

## 2015-11-07 MED ORDER — ONDANSETRON HCL 4 MG PO TABS: 4.0000 mg | ORAL_TABLET | Freq: Three times a day (TID) | ORAL | Status: DC | PRN

## 2015-11-07 MED ORDER — INSULIN ASPART 100 UNIT/ML ~~LOC~~ SOLN
0.0000 [IU] | Freq: Three times a day (TID) | SUBCUTANEOUS | Status: DC
  Administered 2015-11-08 (×2): 3 [IU] via SUBCUTANEOUS

## 2015-11-07 MED ORDER — ASPIRIN 81 MG PO CHEW: 324.0000 mg | CHEWABLE_TABLET | ORAL | Status: AC

## 2015-11-07 MED ORDER — LINAGLIPTIN 5 MG PO TABS
5.0000 mg | ORAL_TABLET | Freq: Every day | ORAL | Status: DC
  Administered 2015-11-08 – 2015-11-09 (×2): 5 mg via ORAL
  Filled 2015-11-07 (×2): qty 1

## 2015-11-07 MED ORDER — SODIUM CHLORIDE 0.9 % IV SOLN: 250.0000 mL | INTRAVENOUS | Status: DC | PRN

## 2015-11-07 MED ORDER — PANTOPRAZOLE SODIUM 40 MG PO TBEC
40.0000 mg | DELAYED_RELEASE_TABLET | Freq: Every day | ORAL | Status: DC
  Administered 2015-11-08 – 2015-11-09 (×2): 40 mg via ORAL
  Filled 2015-11-07 (×2): qty 1

## 2015-11-07 MED ORDER — SODIUM CHLORIDE 0.9% FLUSH: 3.0000 mL | INTRAVENOUS | Status: DC | PRN

## 2015-11-07 MED ORDER — GLIPIZIDE 10 MG PO TABS
10.0000 mg | ORAL_TABLET | Freq: Every day | ORAL | Status: DC
  Administered 2015-11-08 – 2015-11-09 (×2): 10 mg via ORAL
  Filled 2015-11-07 (×4): qty 1

## 2015-11-07 MED ORDER — NITROGLYCERIN 0.4 MG SL SUBL: 0.4000 mg | SUBLINGUAL_TABLET | SUBLINGUAL | Status: DC | PRN

## 2015-11-07 MED ORDER — SODIUM CHLORIDE 0.9% FLUSH
3.0000 mL | Freq: Two times a day (BID) | INTRAVENOUS | Status: DC
  Administered 2015-11-07 – 2015-11-09 (×6): 3 mL via INTRAVENOUS

## 2015-11-07 MED ORDER — ASPIRIN 300 MG RE SUPP: 300.0000 mg | RECTAL | Status: AC

## 2015-11-07 MED ORDER — GLIPIZIDE 5 MG PO TABS
5.0000 mg | ORAL_TABLET | Freq: Every day | ORAL | Status: DC
  Administered 2015-11-07 – 2015-11-09 (×3): 5 mg via ORAL
  Filled 2015-11-07 (×3): qty 1

## 2015-11-07 NOTE — Progress Notes (Signed)
Pt reporting 8/10 squeezing chest pain radiating down left arm. Nitro given x2, pain decreased to 4/10 now squeezing/crushing chest pain radiating down left arm. BP has dropped to 80s/50s, see flowsheets, pt reporting minimal dizziness, held 3rd nitro d/t BP. Dynamap set to cycle BPs Q15 min. EKG ordered per protocol, shows no acute changes. Dr. Nehemiah Massed paged x2, awaiting call back. Will cont. to monitor. Candace [name], primary RN updated as well.

## 2015-11-07 NOTE — Discharge Summary (Addendum)
SHONICA CRAVER, is a 65 y.o. female  DOB September 05, 1950  MRN [id number].  Admission date:  11/05/2015  Admitting Physician  Henreitta Leber, MD  Discharge Date:  11/07/2015   Primary MD  [facility]  Recommendations for primary care physician for things to follow:  Being transferred to [facility] for CABG.   Admission Diagnosis  Precordial chest pain [R07.2]   Discharge Diagnosis  Precordial chest pain [R07.2]   Active Problems:   Chest pain      Past Medical History:  Diagnosis Date  . Anxiety   . Arrhythmia   . Arthritis   . Diabetes mellitus without complication ([place])   . Hyperlipidemia   . Hypertension   . Reflux   . Stroke [facility])     Past Surgical History:  Procedure Laterality Date  . APPENDECTOMY    . CARDIAC CATHETERIZATION N/A 11/06/2015   Procedure: Left Heart Cath and Coronary Angiography;  Surgeon: Corey Skains, MD;  Location: [facility];  Service: Cardiovascular;  Laterality: N/A;  . CESAREAN SECTION    . TUBAL LIGATION         History of present illness and  Hospital Course:     Kindly see H&P for history of present illness and admission details, please review complete Labs, Consult reports and Test reports for all details in brief  HPI  from the history and physical done on the day of admission 65 year old female patient with history of diabetes mellitus, hypertension, history of previous CVA, GERD admitted because of shortness of breath, chest pain, dizziness, nausea and diaphoresis. EEG showed T-wave inversions in lateral leads and she is admitted to telemetry for evaluation of chest pain and angina.  Hospital Course  #1 chest pain secondary to angina. Patient started on aspirin, beta blockers, statins, nitrates. Started on heparin drip. Cardiac  catheter showed a significant 3 vessel coronary artery disease including LAD. Patient is being transferred to [facility] for CABG.  She is on high intensity statins.  #2 diabetes mellitus type 2; she takes  glipizide, metformin. We had to hold the metformin because of cardiac catheter. Using glipizide, metformin and appropriate. She is also on Tradjenta. #3 GERD continue PPIs. Continue IV fluids, IV heparin, medications as per MAR.  Discharge Condition: stable   Follow UP      Discharge Instructions  and  Discharge Medications  Patient is being transferred to [facility].        Diet and Activity recommendation: See Discharge Instructions above   Consults obtained -cardiology   Major procedures and Radiology Reports - PLEASE review detailed and final reports for all details, in brief -     Dg Chest 2 View  Result Date: 11/05/2015 CLINICAL DATA:  Chest pain and shortness of Breath EXAM: CHEST  2 VIEW COMPARISON:  03/15/2015 FINDINGS: The heart size and mediastinal contours are within normal limits. Both lungs are clear. The visualized skeletal structures are unremarkable. IMPRESSION: No active cardiopulmonary disease. Electronically Signed   By: Inez Catalina M.D.   On: 11/05/2015 16:35    Micro Results    No results found for this or any previous visit (from the past 240 hour(s)).     Today   Subjective:   Felipe Drone today,Has chest pain going to the left thumb. Improved with nitrates. Blood pressure is borderline.  Objective:   Blood pressure (!) 95/42, pulse 66, temperature 97.9 F (36.6 C), temperature source Oral, resp.  rate 18, height 5\' 3"  (1.6 m), weight 81.6 kg (180 lb), SpO2 95 %.   Intake/Output Summary (Last 24 hours) at 11/07/15 1159 Last data filed at 11/07/15 1106  Gross per 24 hour  Intake           783.33 ml  Output              775 ml  Net             8.33 ml    Exam Awake Alert, Oriented x 3, No new F.N deficits, Normal  affect [place].AT,PERRAL Supple Neck,No JVD, No cervical lymphadenopathy appriciated.  Symmetrical Chest wall movement, Good air movement bilaterally, CTAB RRR,No Gallops,Rubs or new Murmurs, No Parasternal Heave +ve B.Sounds, Abd Soft, Non tender, No organomegaly appriciated, No rebound -guarding or rigidity. No Cyanosis, Clubbing or edema, No new Rash or bruise  Data Review   CBC w Diff: Lab Results  Component Value Date   WBC 7.7 11/07/2015   HGB 12.9 11/07/2015   HGB 13.8 04/29/2014   HCT 37.9 11/07/2015   HCT 41.3 04/29/2014   PLT 201 11/07/2015   PLT 223 04/29/2014   LYMPHOPCT 43.5 09/16/2013   MONOPCT 6.1 09/16/2013   EOSPCT 1.3 09/16/2013   BASOPCT 0.7 09/16/2013    CMP: Lab Results  Component Value Date   NA 141 11/05/2015   NA 137 04/29/2014   K 3.5 11/05/2015   K 3.5 04/29/2014   CL 109 11/05/2015   CL 103 04/29/2014   CO2 25 11/05/2015   CO2 24 04/29/2014   BUN 14 11/05/2015   BUN 10 04/29/2014   CREATININE 0.70 11/05/2015   CREATININE 0.78 04/29/2014   PROT 7.9 03/15/2015   PROT 7.6 04/29/2014   ALBUMIN 4.4 03/15/2015   ALBUMIN 4.0 04/29/2014   BILITOT 0.9 03/15/2015   BILITOT 0.2 04/29/2014   ALKPHOS 118 03/15/2015   ALKPHOS 112 04/29/2014   AST 21 03/15/2015   AST 18 04/29/2014   ALT 18 03/15/2015   ALT 22 04/29/2014  . Discussed patient, family.  Total Time in preparing paper work, data evaluation and todays exam - 49 minutes  Jere Bostrom M.D on 11/07/2015 at 11:59 AM    Note: This dictation was prepared with Dragon dictation along with smaller phrase technology. Any transcriptional errors that result from this process are unintentional.

## 2015-11-07 NOTE — H&P (Signed)
[address]       [place] [address]             [phone]                                  Chief complaint: Chest pain                        Subjective:  The patient is a 65 year old female recently admitted to [facility]  On 11/05/2015 where she presented with persistent chest pain. A cardiac catheterization was done which revealed significant three-vessel disease and cardiology has recommended CABG. Patient had previous cardiac caths 2011 and 2014, with some  Progression of disease .  She is being transferred to [facility] for surgical opinion and potential procedure arriving 5 pm 11/07/2015 . She has multiple cardiac risk factors including diabetes, hypertension, hyperlipidemia, and previous CVA. She presented on 11/05/2015 with chest pain that was substernal and nonradiating. She did have some associated nausea and diaphoresis. Intensity was measured at 8 out of 10. It began when she was at work. She stated she was seeing black 5 seconds in the front arise and then became dizzy and lightheaded but did not have a full syncopal episode. Shortly after this she developed a chest pain as described. She then went to the emergency department and was found to have an EKG was subtle T-wave inversions in the lateral leads and was felt to require further evaluation and treatment so she was admitted. She was seen by Dr. Nehemiah Massed in cardiology consultation. She was placed on beta blocker Crestor and aspirin and results of cardiac catheterization are noted below.On arrival here the patient states that she is having 8/10 chest pain and was given nitroglycerin and fluid bolus in the ambulance. She also reports that she has had intermittent chest pains since arrival at [facility] and he has received intermittent sublingual nitroglycerin for this. She is now slightly improved with pain at 5/10. She also reports that she has had palpitations, shortness of breath, DOE, orthopnea,  and generalized fatigue. She had some diaphoresis on presentation at [facility] but is not having any kind of sweating currently.      Patient Active Problem List   Diagnosis Date Noted  . Benign essential HTN 10/28/2014  . Episode of syncope 10/25/2014  . Carotid artery narrowing 02/08/2014  . Chest pain 02/08/2014  . Combined fat and carbohydrate induced hyperlipemia 02/08/2014  . Atypical chest pain 02/07/2014  . Arteriosclerosis of coronary artery 02/07/2014  . Essential (primary) hypertension 02/07/2014  . Awareness of heartbeats 02/07/2014  . Abdominal pain 10/05/2013  . D (diarrhea) 10/05/2013  . Acid reflux 10/05/2013       Past Medical History:  Diagnosis Date  . Anxiety   . Arrhythmia   . Arthritis   . Diabetes mellitus without complication ([place])   . Hyperlipidemia   . Hypertension   . Reflux   . Stroke [facility])          Past Surgical History:  Procedure Laterality Date  . APPENDECTOMY    . CARDIAC CATHETERIZATION N/A 11/06/2015   Procedure: Left Heart Cath and Coronary Angiography;  Surgeon: Corey Skains, MD;  Location: [place] CV LAB;  Service: Cardiovascular;  Laterality: N/A;  . CESAREAN SECTION    . TUBAL LIGATION  Prescriptions Prior to Admission  Medication Sig Dispense Refill Last Dose  . aspirin-acetaminophen-caffeine (EXCEDRIN MIGRAINE) 250-250-65 MG per tablet Take 2 tablets by mouth every 6 (six) hours as needed for headache.    at unknown  . glipiZIDE (GLUCOTROL) 10 MG tablet Take 5-10 mg by mouth 2 (two) times daily. Pt takes one tablet in the morning and one-half tablet at bedtime.   unknown at unknown  . linagliptin (TRADJENTA) 5 MG TABS tablet Take 5 mg by mouth daily.   unknown at unknown  . metFORMIN (GLUCOPHAGE-XR) 500 MG 24 hr tablet Take 500 mg by mouth 2 (two) times daily.    at unknown  . metoprolol (LOPRESSOR) 100 MG tablet Take 100 mg by mouth 2 (two) times daily.   unknown at unknown  .  ondansetron (ZOFRAN) 4 MG tablet Take 4 mg by mouth every 8 (eight) hours as needed for nausea or vomiting.    at unknown  . pantoprazole (PROTONIX) 40 MG tablet Take 40 mg by mouth daily.   unknown at unknown  . rosuvastatin (CRESTOR) 20 MG tablet Take by mouth.    at unknown  . sertraline (ZOLOFT) 50 MG tablet Take 50 mg by mouth daily.   unknown at unknown  . cetirizine (ZYRTEC) 10 MG tablet Take 10 mg by mouth daily.     Marland Kitchen lisinopril (PRINIVIL,ZESTRIL) 20 MG tablet Take 20 mg by mouth daily.   Not Taking at unknown        Allergies  Allergen Reactions  . Penicillins Other (See Comments)    UNSPECIFIED          Social History  Substance Use Topics  . Smoking status: Former Smoker    Types: Cigarettes    Quit date: 10/07/2001  . Smokeless tobacco: Never Used  . Alcohol use No         Family History  Problem Relation Age of Onset  . Cancer Father   . Hypertension Father   . Heart disease Father   . Cancer Mother   . Hypertension Mother   . Cancer Sister   . Breast cancer Sister 20  . Breast cancer Sister 20    Scheduled Meds: . aspirin EC  81 mg Oral Daily  . hydrochlorothiazide  12.5 mg Oral Daily  . insulin aspart  0-9 Units Subcutaneous TID WC  . isosorbide mononitrate  30 mg Oral Daily  . linagliptin  5 mg Oral Daily  . lisinopril  20 mg Oral Daily  . metoprolol  100 mg Oral BID  . pantoprazole  40 mg Oral Daily  . rosuvastatin  20 mg Oral Daily  . sertraline  50 mg Oral Daily   Continuous Infusions: . sodium chloride 100 mL/hr at 11/07/15 1259  . heparin 850 Units/hr (11/06/15 2330)   PRN Meds:.[DISCONTINUED] acetaminophen **OR** acetaminophen, acetaminophen, butalbital-acetaminophen-caffeine, nitroGLYCERIN, ondansetron **OR** ondansetron (ZOFRAN) IV, ondansetron (ZOFRAN) IV    Review of Systems Constitutional: feels poorly Eyes: neg Respiratory: + SOB/DOE Cardiovascular: as per HPI Gastrointestinal:  negative Genitourinary:negative Hematologic/lymphatic: negative Musculoskeletal:negative Neurological: previous CVA, no residual Behavioral/Psych: feeling down Endocrine: + DM Allergic/Immunologic: negative  Objective:   Patient Vitals for the past 8 hrs:  BP Temp Temp src Pulse Resp SpO2  11/07/15 1519 (!) 121/52 - - 70 - -  11/07/15 1404 114/63 98.3 F (36.8 C) Oral 78 16 93 %  11/07/15 1130 (!) 95/42 - - 66 - -  11/07/15 1115 (!) 87/44 - - 69 - -  11/07/15 1106 Marland Kitchen)  114/52 - - 70 - -  11/07/15 1045 (!) 102/53 - - 68 - -  11/07/15 1030 (!) 100/54 - - 68 - -  11/07/15 1015 (!) 107/57 - - 72 - -  11/07/15 1000 (!) 95/54 - - 67 - -  11/07/15 0953 (!) 89/47 - - 67 - -  11/07/15 0945 (!) 67/50 - - 66 - -  11/07/15 0941 121/70 - - 90 - -  11/07/15 0930 (!) 100/49 - - 68 - -  11/07/15 0915 (!) 143/120 - - 70 - -  11/07/15 0850 (!) 83/46 - - - - -  11/07/15 0849 (!) 83/46 - - 61 - -  11/07/15 0841 (!) 81/52 - - 63 - -  11/07/15 0841 (!) 87/52 - - - - -  11/07/15 0828 (!) 94/42 - - 64 - -   I/O last 3 completed shifts: In: 763.3 [P.O.:560; I.V.:203.3] Out: 525 [Urine:525] Total I/O In: 480 [P.O.:480] Out: 700 [Urine:700] Procedures   Left Heart Cath and Coronary Angiography  Conclusion     Ramus-2 lesion, 70 %stenosed.  Ramus-1 lesion, 75 %stenosed.  Ost Cx to Dist Cx lesion, 95 %stenosed.  Mid LAD-2 lesion, 85 %stenosed.  Mid LAD-1 lesion, 75 %stenosed.  Dist RCA lesion, 55 %stenosed.  RPDA lesion, 85 %stenosed.  2nd Diag lesion, 50 %stenosed.  Assessment The patient has had progressive canadian class 4anginal symptoms with a high probability stress test with risk factors including diabetes, high blood pressure and high cholesterol.  normal left ventricular function with ejection fraction of 60%  severe3vessel coronary artery disease including significant stenosis of proximal and mid left anterior descending artery circumflex artery and ramus  artery with moderate stenosis of distal right coronary artery  There issignificant stenosis of left anterior descending with high syntax score and diabetes  Plan Continue medical management of CAD risk factors, Consider consultation for CABG and Additional medications for management of angina   Procedural Details/Technique   Technical Details Procedural details: The right groin was prepped, draped, and anesthetized with 1% lidocaine. Using modified Seldinger technique, a 5 French sheath was introduced into the right femoral artery. Standard Judkins catheters were used for coronary angiography and left ventriculography. Catheter exchanges were performed over a guidewire. There were no immediate procedural complications. The patient was transferred to the post catheterization recovery area for further monitoring.  During this procedure the patient is administered a total of Versed 1 mg and Fentanyl 0 mg to achieve and maintain moderate conscious sedation. The patient's heart rate, blood pressure, and oxygen saturation are monitored continuously during the procedure. The period of conscious sedation is 17 minutes, of which I was present face-to-face 100% of this time.    Estimated blood loss <50 mL. .    Coronary Findings   Dominance: Co-dominant  Left Anterior Descending  Mid LAD-1 lesion, 75% stenosed.  Mid LAD-2 lesion, 85% stenosed.  First Diagonal Branch  Vessel is small in size.  Second Diagonal Branch  Vessel is large in size.  2nd Diag lesion, 50% stenosed.  Ramus Intermedius  Vessel is large.  Ramus-1 lesion, 75% stenosed.  Ramus-2 lesion, 70% stenosed.  Left Circumflex  Ost Cx to Dist Cx lesion, 95% stenosed.  First Obtuse Marginal Branch  Vessel is small in size.  Third Obtuse Marginal Branch  Vessel is small in size.  Right Coronary Artery  Dist RCA lesion, 55% stenosed.  Right Posterior Descending Artery  Vessel is small in size.  RPDA lesion, 85% stenosed.  Wall Motion              Coronary Diagrams   Diagnostic Diagram     Implants     Hemo Data   AO Systolic Cath Pressure AO Diastolic Cath Pressure AO Mean Cath Pressure LV Systolic Cath Pressure LV End Diastolic  -- -- -- 92 mmHg 10 mmHg  -- -- -- 93 mmHg 8 mmHg  -- -- -- 97 mmHg 11 mmHg  98 52 mmHg 69 mmHg -- --  97 51 mmHg 68 mmHg -- --  102 54 mmHg 72 mmHg -- --  Order-Level Documents:   Electronically signed by Corey Skains, MD on 11/06/15 at 1552 EDT     BP (!) 164/73   Pulse 64   Resp 13   SpO2 100%  There is no height or weight on file to calculate BMI. General appearance: alert, cooperative and no distress Head: Normocephalic, without obvious abnormality, atraumatic Eyes: PeRRL, EOMI, anict Throat: lips, mucosa, and tongue normal; teeth and gums normal Neck: no adenopathy, no carotid bruit, no JVD, supple, symmetrical, trachea midline and thyroid not enlarged, symmetric, no tenderness/mass/nodules Back: symmetric, no curvature. ROM normal. No CVA tenderness. Lungs: clear to auscultation bilaterally Heart: regular rate and rhythm and soft syst murmur Abdomen: soft, non-tender; bowel sounds normal; no masses,  no organomegaly Extremities: extremities normal, atraumatic, no cyanosis or edema Pulses: 2+ and symmetric Skin: Skin color, texture, turgor normal. No rashes or lesions Lymph nodes: Cervical, supraclavicular, and axillary nodes normal. Neurologic: Grossly normal   Distal pedal pulses present bilateral, right grin closure device in place without hemtoma   Lab Results Past 72 Hours        Results for orders placed or performed during the hospital encounter of 11/05/15 (from the past 72 hour(s))  Basic metabolic panel     Status: Abnormal   Collection Time: 11/05/15  4:50 PM  Result Value Ref Range   Sodium 141 135 - 145 mmol/L   Potassium 3.5 3.5 - 5.1 mmol/L   Chloride 109 101 - 111 mmol/L   CO2 25 22 - 32 mmol/L    Glucose, Bld 101 (H) 65 - 99 mg/dL   BUN 14 6 - 20 mg/dL   Creatinine, Ser 0.70 0.44 - 1.00 mg/dL   Calcium 9.3 8.9 - 10.3 mg/dL   GFR calc non Af Amer >60 >60 mL/min   GFR calc Af Amer >60 >60 mL/min    Comment: (NOTE) The eGFR has been calculated using the CKD EPI equation. This calculation has not been validated in all clinical situations. eGFR's persistently <60 mL/min signify possible Chronic Kidney Disease.   Anion gap 7 5 - 15  CBC     Status: None   Collection Time: 11/05/15  4:50 PM  Result Value Ref Range   WBC 8.9 3.6 - 11.0 K/uL   RBC 4.40 3.80 - 5.20 MIL/uL   Hemoglobin 14.2 12.0 - 16.0 g/dL   HCT 41.0 35.0 - 47.0 %   MCV 93.1 80.0 - 100.0 fL   MCH 32.3 26.0 - 34.0 pg   MCHC 34.8 32.0 - 36.0 g/dL   RDW 13.1 11.5 - 14.5 %   Platelets 235 150 - 440 K/uL  Troponin I     Status: None   Collection Time: 11/05/15  4:50 PM  Result Value Ref Range   Troponin I <0.03 <0.03 ng/mL  Troponin I     Status: None   Collection Time: 11/05/15 10:20 PM  Result  Value Ref Range   Troponin I <0.03 <0.03 ng/mL  Troponin I     Status: None   Collection Time: 11/06/15  2:20 AM  Result Value Ref Range   Troponin I <0.03 <0.03 ng/mL  Troponin I     Status: None   Collection Time: 11/06/15  5:42 AM  Result Value Ref Range   Troponin I <0.03 <0.03 ng/mL  Glucose, capillary     Status: Abnormal   Collection Time: 11/06/15 12:30 PM  Result Value Ref Range   Glucose-Capillary 200 (H) 65 - 99 mg/dL   Comment 1 Notify RN    Comment 2 Document in Chart   APTT     Status: None   Collection Time: 11/06/15  5:28 PM  Result Value Ref Range   aPTT 33 24 - 36 seconds  Protime-INR     Status: None   Collection Time: 11/06/15  5:28 PM  Result Value Ref Range   Prothrombin Time 14.1 11.4 - 15.2 seconds   INR 1.09   Glucose, capillary     Status: Abnormal   Collection Time: 11/06/15  5:28 PM  Result Value Ref Range   Glucose-Capillary 115 (H) 65 - 99  mg/dL   Comment 1 Notify RN    Comment 2 Document in Chart   Glucose, capillary     Status: Abnormal   Collection Time: 11/06/15  8:57 PM  Result Value Ref Range   Glucose-Capillary 154 (H) 65 - 99 mg/dL  Heparin level (unfractionated)     Status: None   Collection Time: 11/07/15  6:05 AM  Result Value Ref Range   Heparin Unfractionated 0.41 0.30 - 0.70 IU/mL    Comment:        IF HEPARIN RESULTS ARE BELOW EXPECTED VALUES, AND PATIENT DOSAGE HAS BEEN CONFIRMED, SUGGEST FOLLOW UP TESTING OF ANTITHROMBIN III LEVELS.  CBC     Status: None   Collection Time: 11/07/15  6:05 AM  Result Value Ref Range   WBC 7.7 3.6 - 11.0 K/uL   RBC 4.08 3.80 - 5.20 MIL/uL   Hemoglobin 12.9 12.0 - 16.0 g/dL   HCT 37.9 35.0 - 47.0 %   MCV 93.0 80.0 - 100.0 fL   MCH 31.6 26.0 - 34.0 pg   MCHC 34.0 32.0 - 36.0 g/dL   RDW 13.0 11.5 - 14.5 %   Platelets 201 150 - 440 K/uL  Glucose, capillary     Status: Abnormal   Collection Time: 11/07/15  8:13 AM  Result Value Ref Range   Glucose-Capillary 121 (H) 65 - 99 mg/dL   Comment 1 Notify RN   Glucose, capillary     Status: Abnormal   Collection Time: 11/07/15 12:05 PM  Result Value Ref Range   Glucose-Capillary 207 (H) 65 - 99 mg/dL  Heparin level (unfractionated)     Status: None   Collection Time: 11/07/15 12:42 PM  Result Value Ref Range   Heparin Unfractionated 0.36 0.30 - 0.70 IU/mL    Comment:        IF HEPARIN RESULTS ARE BELOW EXPECTED VALUES, AND PATIENT DOSAGE HAS BEEN CONFIRMED, SUGGEST FOLLOW UP TESTING OF ANTITHROMBIN III LEVELS.       Dg Chest 2 View  Result Date: 11/05/2015 CLINICAL DATA:  Chest pain and shortness of Breath EXAM: CHEST  2 VIEW COMPARISON:  03/15/2015 FINDINGS: The heart size and mediastinal contours are within normal limits. Both lungs are clear. The visualized skeletal structures are unremarkable. IMPRESSION: No active cardiopulmonary disease.  Electronically Signed   By: Inez Catalina M.D.   On: 11/05/2015 16:35    Assessment:Plan   Active Problems: Chest Pain class 4 angina- with DM, progression of CAD I agree with Dr Ubaldo Glassing that CABG offers best relief of symptoms and  decrease risk of mi. Will start ntg drip . Plan cabg am 8/14, needs echo and carotid doplers done    Diabetes mellitus with complication of coronary artery disease   Arrhythmia  Arthritis  Hyperlipidemia  Hypertension  Reflux  Stroke [facility])- history of     [name] MD      [address] [place] [address] Office [phone]   [place]

## 2015-11-07 NOTE — Progress Notes (Signed)
Called to discuss transfer to [facility] for persistent chest pain. Patient is scheduled for coronary bypass grafting and [facility]. A cardiac catheterization done yesterday which revealed three-vessel disease. Patient and her family expressed understanding and agreed to the transfer. Patient is currently stable.

## 2015-11-07 NOTE — Progress Notes (Signed)
ANTICOAGULATION CONSULT NOTE - Initial Consult  Pharmacy Consult for heparin  Indication: chest pain/ACS  Allergies  Allergen Reactions  . Penicillins Other (See Comments)    UNSPECIFIED     Patient Measurements:   Heparin Dosing Weight: 71 kg  Vital Signs: Temp: 98.3 F (36.8 C) (08/11 1404) Temp Source: Oral (08/11 1404) BP: 141/64 (08/11 1705) Pulse Rate: 73 (08/11 1705)  Labs:  Recent Labs  11/05/15 1650 11/05/15 2220 11/06/15 0220 11/06/15 0542 11/06/15 1728 11/07/15 0605 11/07/15 1242  HGB 14.2  --   --   --   --  12.9  --   HCT 41.0  --   --   --   --  37.9  --   PLT 235  --   --   --   --  201  --   APTT  --   --   --   --  33  --   --   LABPROT  --   --   --   --  14.1  --   --   INR  --   --   --   --  1.09  --   --   HEPARINUNFRC  --   --   --   --   --  0.41 0.36  CREATININE 0.70  --   --   --   --   --   --   TROPONINI <0.03 <0.03 <0.03 <0.03  --   --   --     Estimated Creatinine Clearance: 71.9 mL/min (by C-G formula based on SCr of 0.8 mg/dL).   Medical History: Past Medical History:  Diagnosis Date  . Anxiety   . Arrhythmia   . Arthritis   . Diabetes mellitus without complication ([place])   . Hyperlipidemia   . Hypertension   . Reflux   . Stroke [facility])     Assessment: 65 yo F transferred to [facility] for surgical opinion after cath on 8/10 revealed 3 vessel disease.  Prior to transfer was on heparin infusion at 850 units/hr with therapeutic levels x 2 today.  Goal of Therapy:  Heparin level 0.3-0.7 units/ml Monitor platelets by anticoagulation protocol: Yes   Plan:  - Continue heparin at 850 units/hr - HL in am and daily thereafter  Vincenza Hews, PharmD, BCPS 11/07/2015, 5:33 PM Pager: [phone]

## 2015-11-07 NOTE — Progress Notes (Signed)
ANTICOAGULATION CONSULT NOTE - Follow Up Consult  Pharmacy Consult for Heparin Indication: chest pain/ACS  Allergies  Allergen Reactions  . Penicillins Other (See Comments)    UNSPECIFIED     Patient Measurements: Height: 5\' 3"  (160 cm) Weight: 180 lb (81.6 kg) IBW/kg (Calculated) : 52.4 Heparin Dosing Weight: 70.3 kg  Vital Signs: Temp: 98.3 F (36.8 C) (08/11 1404) Temp Source: Oral (08/11 1404) BP: 121/52 (08/11 1519) Pulse Rate: 70 (08/11 1519)  Labs:  Recent Labs  11/05/15 1650 11/05/15 2220 11/06/15 0220 11/06/15 0542 11/06/15 1728 11/07/15 0605 11/07/15 1242  HGB 14.2  --   --   --   --  12.9  --   HCT 41.0  --   --   --   --  37.9  --   PLT 235  --   --   --   --  201  --   APTT  --   --   --   --  33  --   --   LABPROT  --   --   --   --  14.1  --   --   INR  --   --   --   --  1.09  --   --   HEPARINUNFRC  --   --   --   --   --  0.41 0.36  CREATININE 0.70  --   --   --   --   --   --   TROPONINI <0.03 <0.03 <0.03 <0.03  --   --   --     Estimated Creatinine Clearance: 71.9 mL/min (by C-G formula based on SCr of 0.8 mg/dL).   Medications:  Infusions:  . sodium chloride 100 mL/hr at 11/07/15 1259  . heparin 850 Units/hr (11/06/15 2330)    Assessment: Pt is a 65 year old female who underwent cardiac cath today. She is to undergo triple bypass surgery. Cardiology would like tx medically with heparin drip in the mean time. Drip to start 8 hours after sheath removal. Per RN in specials, sheath removed at 1600. Heparin drip to start at 0000.  8/11 0600 Heparin level resulted at 0.41 8/11 1200 Heparin level resulted at 0.36  Goal of Therapy:  Heparin level 0.3-0.7 units/ml Monitor platelets by anticoagulation protocol: Yes   Plan:  Continue to monitor H&H and platelets daily. Will continue at current rate of 850 units/hr. Next Heparin level will be with AM labs.  Per Cardiology note, patient is going to be transferred to [facility] for bypass  surgery today. Have called report to pharmacist and moved to follow up folder at [facility].  Paulina Fusi, PharmD, BCPS 11/07/2015 4:08 PM

## 2015-11-08 ENCOUNTER — Inpatient Hospital Stay (HOSPITAL_COMMUNITY): Payer: BLUE CROSS/BLUE SHIELD

## 2015-11-08 ENCOUNTER — Other Ambulatory Visit: Payer: Self-pay

## 2015-11-08 ENCOUNTER — Encounter (HOSPITAL_COMMUNITY): Payer: Self-pay | Admitting: *Deleted

## 2015-11-08 DIAGNOSIS — I2510 Atherosclerotic heart disease of native coronary artery without angina pectoris: Secondary | ICD-10-CM

## 2015-11-08 LAB — BASIC METABOLIC PANEL
Anion gap: 8 (ref 5–15)
BUN: 9 mg/dL (ref 6–20)
CO2: 28 mmol/L (ref 22–32)
Calcium: 8.8 mg/dL — ABNORMAL LOW (ref 8.9–10.3)
Chloride: 105 mmol/L (ref 101–111)
Creatinine, Ser: 0.64 mg/dL (ref 0.44–1.00)
GFR calc Af Amer: >60 mL/min (ref >60)
GFR calc non Af Amer: >60 mL/min (ref >60)
Glucose, Bld: 131 mg/dL — ABNORMAL HIGH (ref 65–99)
Potassium: 3.8 mmol/L (ref 3.5–5.1)
Sodium: 141 mmol/L (ref 135–145)

## 2015-11-08 LAB — HEPARIN LEVEL (UNFRACTIONATED): Heparin Unfractionated: 0.46 IU/mL (ref 0.30–0.70)

## 2015-11-08 LAB — GLUCOSE, CAPILLARY
Glucose-Capillary: 147 mg/dL — ABNORMAL HIGH (ref 65–99)
Glucose-Capillary: 231 mg/dL — ABNORMAL HIGH (ref 65–99)
Glucose-Capillary: 152 mg/dL — ABNORMAL HIGH (ref 65–99)
Glucose-Capillary: 99 mg/dL (ref 65–99)

## 2015-11-08 LAB — CBC
HCT: 34.7 % — ABNORMAL LOW (ref 36.0–46.0)
Hemoglobin: 11.3 g/dL — ABNORMAL LOW (ref 12.0–15.0)
MCH: 30.6 pg (ref 26.0–34.0)
MCHC: 32.6 g/dL (ref 30.0–36.0)
MCV: 94 fL (ref 78.0–100.0)
Platelets: 198 10*3/uL (ref 150–400)
RBC: 3.69 MIL/uL — ABNORMAL LOW (ref 3.87–5.11)
RDW: 12.4 % (ref 11.5–15.5)
WBC: 8.1 10*3/uL (ref 4.0–10.5)

## 2015-11-08 LAB — ECHOCARDIOGRAM COMPLETE
Height: 62.992 in
Weight: 2928 oz

## 2015-11-08 LAB — TROPONIN I
Troponin I: <0.03 ng/mL (ref <0.03)
Troponin I: <0.03 ng/mL (ref <0.03)

## 2015-11-08 MED ORDER — MORPHINE SULFATE (PF) 2 MG/ML IV SOLN
INTRAVENOUS | Status: AC
  Filled 2015-11-08: qty 1

## 2015-11-08 MED ORDER — MORPHINE SULFATE (PF) 2 MG/ML IV SOLN
2.0000 mg | INTRAVENOUS | Status: DC | PRN
  Administered 2015-11-08 – 2015-11-09 (×5): 2 mg via INTRAVENOUS
  Filled 2015-11-08 (×5): qty 1

## 2015-11-08 NOTE — Progress Notes (Signed)
Pt with 7/10 chest pain described as sharp and stabbing. Dr. Servando Snare called and spoke with regarding current pain. Nitro gtt maxed out at 87mcg, 12 lead ecg preformed. Orders received for prn Morphine. Will continue to monitor closely.  Eleonore Chiquito RN 2 [place]

## 2015-11-08 NOTE — Progress Notes (Signed)
Patient ID: Candace [name], female   DOB: [birth year], 65 y.o.   MRN: [id number] EVENING ROUNDS NOTE :     [address]       [name] [address]             [phone]                     Total Length of Stay:  LOS: 1 day  BP 105/65 (BP Location: Right Arm)   Pulse 74   Temp 97.6 F (36.4 C) (Oral)   Resp 17   Ht 5' 2.99" (1.6 m)   Wt 183 lb (83 kg)   SpO2 94%   BMI 32.43 kg/m   .Intake/Output      08/11 0701 - 08/12 0700 08/12 0701 - 08/13 0700   P.O. 100    I.V. (mL/kg) 491.2 (5.9) 210 (2.5)   Total Intake(mL/kg) 591.2 (7.1) 210 (2.5)   Urine (mL/kg/hr) 400 100 (0.1)   Total Output 400 100   Net +191.2 +110        Urine Occurrence 1 x 1 x     . heparin 850 Units/hr (11/08/15 1800)  . nitroGLYCERIN 30 mcg/min (11/08/15 1800)     Lab Results  Component Value Date   WBC 8.1 11/08/2015   HGB 11.3 (L) 11/08/2015   HCT 34.7 (L) 11/08/2015   PLT 198 11/08/2015   GLUCOSE 131 (H) 11/08/2015   CHOL 214 (H) 07/14/2013   TRIG 118 07/14/2013   HDL 36 (L) 07/14/2013   LDLCALC 154 (H) 07/14/2013   ALT 11 (L) 11/07/2015   AST 16 11/07/2015   NA 141 11/08/2015   K 3.8 11/08/2015   CL 105 11/08/2015   CREATININE 0.64 11/08/2015   BUN 9 11/08/2015   CO2 28 11/08/2015   TSH 0.763 01/23/2012   INR 1.09 11/07/2015   HGBA1C 8.3 (H) 07/14/2013   Echo done, dopplers pending No chest pain during day Mild headache  [name] MD  [place] [phone] Office [phone] 11/08/2015 6:52 PM

## 2015-11-08 NOTE — Progress Notes (Signed)
ANTICOAGULATION CONSULT NOTE - Initial Consult  Pharmacy Consult for heparin  Indication: chest pain/ACS  Allergies  Allergen Reactions  . Penicillins Other (See Comments)    UNSPECIFIED     Patient Measurements: Height: 5' 2.99" (160 cm) Weight: 183 lb (83 kg) IBW/kg (Calculated) : 52.38 Heparin Dosing Weight: 71 kg  Vital Signs: Temp: 97.9 F (36.6 C) (08/12 0813) Temp Source: Oral (08/12 0813) BP: 140/74 (08/12 0800) Pulse Rate: 62 (08/12 0800)  Labs:  Recent Labs  11/05/15 1650  11/06/15 1728 11/07/15 0605 11/07/15 1242 11/07/15 2007 11/07/15 2257 11/08/15 0554  HGB 14.2  --   --  12.9  --  11.2*  --  11.3*  HCT 41.0  --   --  37.9  --  35.1*  --  34.7*  PLT 235  --   --  201  --  170  --  198  APTT  --   --  33  --   --  84*  --   --   LABPROT  --   --  14.1  --   --  14.2  --   --   INR  --   --  1.09  --   --  1.09  --   --   HEPARINUNFRC  --   --   --  0.41 0.36  --   --  0.46  CREATININE 0.70  --   --   --   --  0.76  --  0.64  TROPONINI <0.03  < >  --   --   --  <0.03 <0.03 <0.03  < > = values in this interval not displayed.  Estimated Creatinine Clearance: 72.5 mL/min (by C-G formula based on SCr of 0.8 mg/dL).   Medical History: Past Medical History:  Diagnosis Date  . Anxiety   . Arrhythmia   . Arthritis   . Diabetes mellitus without complication ([place])   . Hyperlipidemia   . Hypertension   . Reflux   . Stroke [facility])     Assessment: 65 yo F transferred to [facility] for surgical opinion after cath on 8/10 revealed 3 vessel disease. Prior to transfer was on heparin infusion at 850 units/hr with therapeutic levels x 2. Not on AC pta.  HL remains therapeutic this AM. Hg low stable, plt wnl, no bleed documented. CABG scheduled for Monday.  Goal of Therapy:  Heparin level 0.3-0.7 units/ml Monitor platelets by anticoagulation protocol: Yes   Plan:  - Continue heparin at 850 units/hr - Daily HL/CBC - Monitor s/sx bleeding - CABG 8/14   Elicia Lamp, PharmD, [facility] Clinical Pharmacist Pager [phone] 11/08/2015 10:32 AM

## 2015-11-08 NOTE — Progress Notes (Signed)
  Echocardiogram 2D Echocardiogram has been performed.  [name] 11/08/2015, 9:05 AM

## 2015-11-08 NOTE — Progress Notes (Addendum)
Patient ID: Candace [name], female   DOB: [birth year], 65 y.o.   MRN: [id number] TCTS DAILY ICU PROGRESS NOTE                   [address]            [place] [address]          [phone]       Total Length of Stay:  LOS: 1 day   Subjective: On heparin and ntg, some pain during night, ekg unchanged and all troponin negative   Objective: Vital signs in last 24 hours: Temp:  [97.8 F (36.6 C)-98.3 F (36.8 C)] 97.9 F (36.6 C) (08/12 0813) Pulse Rate:  [61-94] 62 (08/12 0800) Cardiac Rhythm: Normal sinus rhythm (08/12 0800) Resp:  [0-21] 14 (08/12 0800) BP: (67-173)/(42-123) 140/74 (08/12 0800) SpO2:  [86 %-100 %] 90 % (08/12 0800) Weight:  [183 lb (83 kg)-184 lb 1.4 oz (83.5 kg)] 183 lb (83 kg) (08/12 0500)  Filed Weights   11/07/15 2000 11/08/15 0500  Weight: 184 lb 1.4 oz (83.5 kg) 183 lb (83 kg)    Weight change:    Hemodynamic parameters for last 24 hours:    Intake/Output from previous day: 08/11 0701 - 08/12 0700 In: 591.2 [P.O.:100; I.V.:491.2] Out: 400 [Urine:400]  Intake/Output this shift: Total I/O In: 35 [I.V.:35] Out: -   Current Meds: Scheduled Meds: . antiseptic oral rinse  7 mL Mouth Rinse BID  . aspirin  324 mg Oral NOW   Or  . aspirin  300 mg Rectal NOW  . aspirin EC  81 mg Oral Daily  . glipiZIDE  10 mg Oral QAC breakfast   And  . glipiZIDE  5 mg Oral QHS  . insulin aspart  0-15 Units Subcutaneous TID WC  . linagliptin  5 mg Oral Daily  . lisinopril  20 mg Oral Daily  . metFORMIN  500 mg Oral BID WC  . metoprolol  100 mg Oral BID  . pantoprazole  40 mg Oral Daily  . rosuvastatin  40 mg Oral q1800  . sertraline  50 mg Oral Daily  . sodium chloride flush  3 mL Intravenous Q12H   Continuous Infusions: . heparin 850 Units/hr (11/08/15 0800)  . nitroGLYCERIN 30 mcg/min (11/08/15 0800)   PRN Meds:.sodium chloride, acetaminophen, morphine injection, nitroGLYCERIN, ondansetron (ZOFRAN) IV, sodium chloride  flush  General appearance: alert and cooperative Neurologic: intact Heart: regular rate and rhythm, S1, S2 normal, no murmur, click, rub or gallop Lungs: clear to auscultation bilaterally Abdomen: soft, non-tender; bowel sounds normal; no masses,  no organomegaly Extremities: extremities normal, atraumatic, no cyanosis or edema and Homans sign is negative, no sign of DVT  Lab Results: CBC: Recent Labs  11/07/15 2007 11/08/15 0554  WBC 8.0 8.1  HGB 11.2* 11.3*  HCT 35.1* 34.7*  PLT 170 198   BMET:  Recent Labs  11/05/15 1650 11/07/15 2007  NA 141 139  K 3.5 3.9  CL 109 109  CO2 25 21*  GLUCOSE 101* 147*  BUN 14 12  CREATININE 0.70 0.76  CALCIUM 9.3 8.6*    CMET: Lab Results  Component Value Date   WBC 8.1 11/08/2015   HGB 11.3 (L) 11/08/2015   HCT 34.7 (L) 11/08/2015   PLT 198 11/08/2015   GLUCOSE 147 (H) 11/07/2015   CHOL 214 (H) 07/14/2013   TRIG 118 07/14/2013   HDL 36 (L) 07/14/2013   LDLCALC 154 (H) 07/14/2013  ALT 11 (L) 11/07/2015   AST 16 11/07/2015   NA 139 11/07/2015   K 3.9 11/07/2015   CL 109 11/07/2015   CREATININE 0.76 11/07/2015   BUN 12 11/07/2015   CO2 21 (L) 11/07/2015   TSH 0.763 01/23/2012   INR 1.09 11/07/2015   HGBA1C 8.3 (H) 07/14/2013   Lab Results  Component Value Date   CKTOTAL 91 08/01/2012   CKMB 5.9 (H) 07/14/2013   TROPONINI <0.03 11/07/2015     PT/INR:  Recent Labs  11/07/15 2007  LABPROT 14.2  INR 1.09   Radiology: No results found.   Assessment/Plan: preop anemia,- unknown cause  Cad for CABG Monday am Negative tropon Dm management with ss insulin  For echo and carotid doppler  today- history of carotid artery disease treated  with asa  Right groin cath site ok     [name] 11/08/2015 8:49 AM

## 2015-11-09 ENCOUNTER — Inpatient Hospital Stay (HOSPITAL_COMMUNITY): Payer: BLUE CROSS/BLUE SHIELD

## 2015-11-09 DIAGNOSIS — Z01810 Encounter for preprocedural cardiovascular examination: Secondary | ICD-10-CM

## 2015-11-09 LAB — GLUCOSE, CAPILLARY
Glucose-Capillary: 102 mg/dL — ABNORMAL HIGH (ref 65–99)
Glucose-Capillary: 67 mg/dL (ref 65–99)
Glucose-Capillary: 79 mg/dL (ref 65–99)
Glucose-Capillary: 80 mg/dL (ref 65–99)
Glucose-Capillary: 159 mg/dL — ABNORMAL HIGH (ref 65–99)

## 2015-11-09 LAB — COMPREHENSIVE METABOLIC PANEL
ALT: 14 U/L (ref 14–54)
AST: 21 U/L (ref 15–41)
Albumin: 3.3 g/dL — ABNORMAL LOW (ref 3.5–5.0)
Alkaline Phosphatase: 75 U/L (ref 38–126)
Anion gap: 5 (ref 5–15)
BUN: 6 mg/dL (ref 6–20)
CO2: 28 mmol/L (ref 22–32)
Calcium: 8.7 mg/dL — ABNORMAL LOW (ref 8.9–10.3)
Chloride: 105 mmol/L (ref 101–111)
Creatinine, Ser: 0.64 mg/dL (ref 0.44–1.00)
GFR calc Af Amer: >60 mL/min (ref >60)
GFR calc non Af Amer: >60 mL/min (ref >60)
Glucose, Bld: 93 mg/dL (ref 65–99)
Potassium: 3.7 mmol/L (ref 3.5–5.1)
Sodium: 138 mmol/L (ref 135–145)
Total Bilirubin: 0.5 mg/dL (ref 0.3–1.2)
Total Protein: 6.1 g/dL — ABNORMAL LOW (ref 6.5–8.1)

## 2015-11-09 LAB — BLOOD GAS, ARTERIAL
Acid-Base Excess: 2.2 mmol/L — ABNORMAL HIGH (ref 0.0–2.0)
Bicarbonate: 27.3 mEq/L — ABNORMAL HIGH (ref 20.0–24.0)
Drawn by: 331761
O2 Content: 2 L/min
O2 Saturation: 94.6 %
Patient temperature: 98.6
TCO2: 28.8 mmol/L (ref 0–100)
pCO2 arterial: 50.8 mmHg — ABNORMAL HIGH (ref 35.0–45.0)
pH, Arterial: 7.35 (ref 7.350–7.450)
pO2, Arterial: 76.8 mmHg — ABNORMAL LOW (ref 80.0–100.0)

## 2015-11-09 LAB — VAS US DOPPLER PRE CABG
LEFT ECA DIAS: -8 cm/s
LEFT VERTEBRAL DIAS: 29 cm/s
Left CCA dist dias: -43 cm/s
Left CCA dist sys: -143 cm/s
Left CCA prox dias: 52 cm/s
Left CCA prox sys: 193 cm/s
Left ICA dist dias: -89 cm/s
Left ICA dist sys: -206 cm/s
Left ICA prox dias: -26 cm/s
Left ICA prox sys: -86 cm/s
RIGHT ECA DIAS: -17 cm/s
RIGHT VERTEBRAL DIAS: 19 cm/s
Right CCA prox dias: -32 cm/s
Right CCA prox sys: -148 cm/s
Right cca dist sys: -175 cm/s

## 2015-11-09 LAB — URINALYSIS, ROUTINE W REFLEX MICROSCOPIC
Bilirubin Urine: NEGATIVE
Glucose, UA: NEGATIVE mg/dL
Hgb urine dipstick: NEGATIVE
Ketones, ur: NEGATIVE mg/dL
Leukocytes, UA: NEGATIVE
Nitrite: NEGATIVE
Protein, ur: NEGATIVE mg/dL
Specific Gravity, Urine: 1.015 (ref 1.005–1.030)
pH: 6 (ref 5.0–8.0)

## 2015-11-09 LAB — HEPARIN LEVEL (UNFRACTIONATED): Heparin Unfractionated: 0.43 IU/mL (ref 0.30–0.70)

## 2015-11-09 LAB — ABO/RH: ABO/RH(D): O POS

## 2015-11-09 MED ORDER — CHLORHEXIDINE GLUCONATE CLOTH 2 % EX PADS
6.0000 | MEDICATED_PAD | Freq: Once | CUTANEOUS | Status: AC
  Administered 2015-11-09: 6 via TOPICAL

## 2015-11-09 MED ORDER — CHLORHEXIDINE GLUCONATE CLOTH 2 % EX PADS
6.0000 | MEDICATED_PAD | Freq: Once | CUTANEOUS | Status: AC
  Administered 2015-11-10: 6 via TOPICAL

## 2015-11-09 MED ORDER — MAGNESIUM SULFATE 50 % IJ SOLN
40.0000 meq | INTRAMUSCULAR | Status: DC
  Filled 2015-11-09: qty 10

## 2015-11-09 MED ORDER — CHLORHEXIDINE GLUCONATE 0.12 % MT SOLN
15.0000 mL | Freq: Once | OROMUCOSAL | Status: AC
  Administered 2015-11-10: 15 mL via OROMUCOSAL
  Filled 2015-11-09: qty 15

## 2015-11-09 MED ORDER — DEXMEDETOMIDINE HCL IN NACL 400 MCG/100ML IV SOLN
0.1000 ug/kg/h | INTRAVENOUS | Status: AC
  Administered 2015-11-10: .3 ug/kg/h via INTRAVENOUS
  Filled 2015-11-09: qty 100

## 2015-11-09 MED ORDER — DOPAMINE-DEXTROSE 3.2-5 MG/ML-% IV SOLN
0.0000 ug/kg/min | INTRAVENOUS | Status: DC
  Filled 2015-11-09: qty 250

## 2015-11-09 MED ORDER — DEXTROSE 5 % IV SOLN
30.0000 ug/min | INTRAVENOUS | Status: AC
  Administered 2015-11-10: 10 ug/min via INTRAVENOUS
  Filled 2015-11-09: qty 2

## 2015-11-09 MED ORDER — EPINEPHRINE HCL 1 MG/ML IJ SOLN
0.0000 ug/min | INTRAVENOUS | Status: DC
  Filled 2015-11-09: qty 4

## 2015-11-09 MED ORDER — TEMAZEPAM 7.5 MG PO CAPS: 15.0000 mg | ORAL_CAPSULE | Freq: Once | ORAL | Status: DC | PRN

## 2015-11-09 MED ORDER — POTASSIUM CHLORIDE 2 MEQ/ML IV SOLN
80.0000 meq | INTRAVENOUS | Status: DC
  Filled 2015-11-09: qty 40

## 2015-11-09 MED ORDER — NITROGLYCERIN IN D5W 200-5 MCG/ML-% IV SOLN
2.0000 ug/min | INTRAVENOUS | Status: DC
  Filled 2015-11-09: qty 250

## 2015-11-09 MED ORDER — PLASMA-LYTE 148 IV SOLN
INTRAVENOUS | Status: AC
  Administered 2015-11-10: 500 mL
  Filled 2015-11-09: qty 2.5

## 2015-11-09 MED ORDER — LEVOFLOXACIN IN D5W 500 MG/100ML IV SOLN
500.0000 mg | INTRAVENOUS | Status: AC
  Administered 2015-11-10: 500 mg via INTRAVENOUS
  Filled 2015-11-09: qty 100

## 2015-11-09 MED ORDER — SODIUM CHLORIDE 0.9 % IV SOLN
INTRAVENOUS | Status: DC
  Filled 2015-11-09: qty 30

## 2015-11-09 MED ORDER — SODIUM CHLORIDE 0.9 % IV SOLN
INTRAVENOUS | Status: AC
  Administered 2015-11-10: 1.4 [IU]/h via INTRAVENOUS
  Filled 2015-11-09: qty 2.5

## 2015-11-09 MED ORDER — VANCOMYCIN HCL 10 G IV SOLR
1250.0000 mg | INTRAVENOUS | Status: AC
  Administered 2015-11-10: 1250 mg via INTRAVENOUS
  Filled 2015-11-09: qty 1250

## 2015-11-09 MED ORDER — BISACODYL 5 MG PO TBEC
5.0000 mg | DELAYED_RELEASE_TABLET | Freq: Once | ORAL | Status: AC
  Administered 2015-11-09: 5 mg via ORAL
  Filled 2015-11-09: qty 1

## 2015-11-09 MED ORDER — METOPROLOL TARTRATE 12.5 MG HALF TABLET
12.5000 mg | ORAL_TABLET | Freq: Once | ORAL | Status: AC
  Administered 2015-11-10: 12.5 mg via ORAL
  Filled 2015-11-09: qty 1

## 2015-11-09 MED ORDER — SODIUM CHLORIDE 0.9 % IV SOLN
INTRAVENOUS | Status: AC
  Administered 2015-11-10: 69.8 mL/h via INTRAVENOUS
  Filled 2015-11-09: qty 40

## 2015-11-09 NOTE — Progress Notes (Signed)
ANTICOAGULATION CONSULT NOTE - [place] for heparin  Indication: chest pain/ACS  Allergies  Allergen Reactions  . Penicillins Other (See Comments)    UNSPECIFIED     Patient Measurements: Height: 5\' 3"  (160 cm) Weight: 179 lb 0.2 oz (81.2 kg) IBW/kg (Calculated) : 52.4 Heparin Dosing Weight: 71 kg  Vital Signs: Temp: 98.6 F (37 C) (08/13 0834) Temp Source: Oral (08/13 0834) BP: 124/64 (08/13 0800) Pulse Rate: 69 (08/13 0800)  Labs:  Recent Labs  11/06/15 1728  11/07/15 0605 11/07/15 1242 11/07/15 2007 11/07/15 2257 11/08/15 0554 11/09/15 0437 11/09/15 0937  HGB  --   < > 12.9  --  11.2*  --  11.3*  --   --   HCT  --   --  37.9  --  35.1*  --  34.7*  --   --   PLT  --   --  201  --  170  --  198  --   --   APTT 33  --   --   --  84*  --   --   --   --   LABPROT 14.1  --   --   --  14.2  --   --   --   --   INR 1.09  --   --   --  1.09  --   --   --   --   HEPARINUNFRC  --   < > 0.41 0.36  --   --  0.46 0.43  --   CREATININE  --   --   --   --  0.76  --  0.64  --  0.64  TROPONINI  --   --   --   --  <0.03 <0.03 <0.03  --   --   < > = values in this interval not displayed.  Estimated Creatinine Clearance: 71.7 mL/min (by C-G formula based on SCr of 0.8 mg/dL).   Medical History: Past Medical History:  Diagnosis Date  . Anxiety   . Arrhythmia   . Arthritis   . Diabetes mellitus without complication ([place])   . Hyperlipidemia   . Hypertension   . Reflux   . Stroke [facility])     Assessment: 65 yo F transferred to [facility] for surgical opinion after cath on 8/10 revealed 3 vessel disease. Prior to transfer was on heparin infusion at 850 units/hr with therapeutic levels x 2. Not on AC pta.   HL remains therapeutic this AM. Hg low stable, plt wnl, no bleed documented. CABG scheduled for Monday.  Goal of Therapy:  Heparin level 0.3-0.7 units/ml Monitor platelets by anticoagulation protocol: Yes   Plan:  - Continue heparin at 850  units/hr - Daily HL/CBC - Monitor s/sx bleeding - CABG 8/14   Emily Stewart, [place] Pager [phone] 11/09/2015 12:13 PM

## 2015-11-09 NOTE — Progress Notes (Signed)
Pre-op Cardiac Surgery  Carotid Findings:  Findings suggest 60-79% right internal carotid artery stenosis and 1-39% left internal carotid artery stenosis. Vertebral arteries are patent with antegrade flow.  Upper Extremity Right Left  Brachial Pressures 122-Triphasic Triphasic- unable to obtain pressure due to IV location.  Radial Waveforms Triphasic Triphasic  Ulnar Waveforms Triphasic Triphasic  Palmar Arch (Allen's Test) Signal obliterates with radial compression, is unaffected with ulnar compression. Within normal limits.    Lower  Extremity Right Left  Dorsalis Pedis Biphasic-68 Monophasic-77  Anterior Tibial    Posterior Tibial Biphasic-96 Monophasic-86  Ankle/Brachial Indices 0.79 0.7    Findings:   The right ABI is suggestive of moderate arterial insufficiency, borderline mild arterial insufficiency at rest. The left ABI is suggestive of moderate arterial insufficiency at rest.  11/09/2015 1:54 PM Maudry Mayhew, BS, RVT, RDCS, RDMS

## 2015-11-09 NOTE — Anesthesia Preprocedure Evaluation (Addendum)
Anesthesia Evaluation  Patient identified by MRN, date of birth, ID band Patient awake    Reviewed: Allergy & Precautions, NPO status , Patient's Chart, lab work & pertinent test results  Airway Mallampati: III  TM Distance: >3 FB Neck ROM: Full    Dental  (+) Dental Advisory Given, Partial Upper, Partial Lower   Pulmonary former smoker,    breath sounds clear to auscultation       Cardiovascular hypertension, Pt. on medications and Pt. on home beta blockers + angina + CAD and + Peripheral Vascular Disease   Rhythm:Regular Rate:Normal  EF 55-60%. Valves ok   Neuro/Psych CVA    GI/Hepatic Neg liver ROS, GERD  ,  Endo/Other  diabetes, Type 2, Oral Hypoglycemic Agents  Renal/GU negative Renal ROS     Musculoskeletal  (+) Arthritis ,   Abdominal   Peds  Hematology  (+) anemia ,   Anesthesia Other Findings   Reproductive/Obstetrics                           Lab Results  Component Value Date   WBC 8.1 11/08/2015   HGB 11.3 (L) 11/08/2015   HCT 34.7 (L) 11/08/2015   MCV 94.0 11/08/2015   PLT 198 11/08/2015   Lab Results  Component Value Date   CREATININE 0.64 11/09/2015   BUN 6 11/09/2015   NA 138 11/09/2015   K 3.7 11/09/2015   CL 105 11/09/2015   CO2 28 11/09/2015    Anesthesia Physical Anesthesia Plan  ASA: IV  Anesthesia Plan: General   Post-op Pain Management:    Induction: Intravenous  Airway Management Planned: Oral ETT  Additional Equipment: Arterial line, TEE, CVP, PA Cath and Ultrasound Guidance Line Placement  Intra-op Plan:   Post-operative Plan: Post-operative intubation/ventilation  Informed Consent: I have reviewed the patients History and Physical, chart, labs and discussed the procedure including the risks, benefits and alternatives for the proposed anesthesia with the patient or authorized representative who has indicated his/her understanding and  acceptance.   Dental advisory given  Plan Discussed with: CRNA  Anesthesia Plan Comments:        Anesthesia Quick Evaluation

## 2015-11-09 NOTE — Progress Notes (Signed)
TCTS DAILY ICU PROGRESS NOTE                   [address]            [place] [address]          [phone]       Total Length of Stay:  LOS: 2 days   Subjective: Not chest pain today  Objective: Vital signs in last 24 hours: Temp:  [97.6 F (36.4 C)-99.2 F (37.3 C)] 98.6 F (37 C) (08/13 0834) Pulse Rate:  [58-80] 69 (08/13 0800) Cardiac Rhythm: Normal sinus rhythm (08/13 0800) Resp:  [11-24] 13 (08/13 0800) BP: (84-157)/(31-108) 124/64 (08/13 0800) SpO2:  [91 %-100 %] 99 % (08/13 0800) Weight:  [179 lb 0.2 oz (81.2 kg)] 179 lb 0.2 oz (81.2 kg) (08/13 0900)  Filed Weights   11/08/15 0500 11/09/15 0600 11/09/15 0900  Weight: 183 lb (83 kg) 179 lb 0.2 oz (81.2 kg) 179 lb 0.2 oz (81.2 kg)    Weight change: -5 lb 1.1 oz (-2.3 kg)   Hemodynamic parameters for last 24 hours:    Intake/Output from previous day: 08/12 0701 - 08/13 0700 In: 800.5 [P.O.:200; I.V.:600.5] Out: 1200 [Urine:1200]  Intake/Output this shift: Total I/O In: 18.5 [I.V.:18.5] Out: -   Current Meds: Scheduled Meds: . [START ON 11/10/2015] aminocaproic acid (AMICAR) for OHS   Intravenous To OR  . antiseptic oral rinse  7 mL Mouth Rinse BID  . aspirin EC  81 mg Oral Daily  . [START ON 11/10/2015] chlorhexidine  15 mL Mouth/Throat Once  . Chlorhexidine Gluconate Cloth  6 each Topical Once   And  . [START ON 11/10/2015] Chlorhexidine Gluconate Cloth  6 each Topical Once  . [START ON 11/10/2015] dexmedetomidine  0.1-0.7 mcg/kg/hr Intravenous To OR  . [START ON 11/10/2015] DOPamine  0-10 mcg/kg/min Intravenous To OR  . [START ON 11/10/2015] epinephrine  0-10 mcg/min Intravenous To OR  . glipiZIDE  10 mg Oral QAC breakfast   And  . glipiZIDE  5 mg Oral QHS  . [START ON 11/10/2015] heparin-papaverine-plasmalyte irrigation   Irrigation To OR  . [START ON 11/10/2015] heparin 30,000 units/NS 1000 mL solution for CELLSAVER   Other To OR  . insulin aspart  0-15 Units Subcutaneous TID WC    . [START ON 11/10/2015] insulin (NOVOLIN-R) infusion   Intravenous To OR  . [START ON 11/10/2015] levofloxacin (LEVAQUIN) IV  500 mg Intravenous To OR  . linagliptin  5 mg Oral Daily  . [START ON 11/10/2015] magnesium sulfate  40 mEq Other To OR  . metFORMIN  500 mg Oral BID WC  . metoprolol  100 mg Oral BID  . [START ON 11/10/2015] metoprolol tartrate  12.5 mg Oral Once  . [START ON 11/10/2015] nitroGLYCERIN  2-200 mcg/min Intravenous To OR  . pantoprazole  40 mg Oral Daily  . [START ON 11/10/2015] phenylephrine (NEO-SYNEPHRINE) Adult infusion  30-200 mcg/min Intravenous To OR  . [START ON 11/10/2015] potassium chloride  80 mEq Other To OR  . rosuvastatin  40 mg Oral q1800  . sertraline  50 mg Oral Daily  . sodium chloride flush  3 mL Intravenous Q12H  . [START ON 11/10/2015] vancomycin  1,250 mg Intravenous To OR   Continuous Infusions: . heparin 850 Units/hr (11/09/15 0939)  . nitroGLYCERIN Stopped (11/09/15 0300)   PRN Meds:.sodium chloride, acetaminophen, morphine injection, nitroGLYCERIN, ondansetron (ZOFRAN) IV, sodium chloride flush, temazepam  General appearance: alert, cooperative and no  distress Neurologic: intact Heart: regular rate and rhythm, S1, S2 normal, no murmur, click, rub or gallop Lungs: clear to auscultation bilaterally Abdomen: soft, non-tender; bowel sounds normal; no masses,  no organomegaly Extremities: extremities normal, atraumatic, no cyanosis or edema and Homans sign is negative, no sign of DVT  Lab Results: CBC: Recent Labs  11/07/15 2007 11/08/15 0554  WBC 8.0 8.1  HGB 11.2* 11.3*  HCT 35.1* 34.7*  PLT 170 198   BMET:  Recent Labs  11/07/15 2007 11/08/15 0554  NA 139 141  K 3.9 3.8  CL 109 105  CO2 21* 28  GLUCOSE 147* 131*  BUN 12 9  CREATININE 0.76 0.64  CALCIUM 8.6* 8.8*    CMET: Lab Results  Component Value Date   WBC 8.1 11/08/2015   HGB 11.3 (L) 11/08/2015   HCT 34.7 (L) 11/08/2015   PLT 198 11/08/2015   GLUCOSE 131 (H)  11/08/2015   CHOL 214 (H) 07/14/2013   TRIG 118 07/14/2013   HDL 36 (L) 07/14/2013   LDLCALC 154 (H) 07/14/2013   ALT 11 (L) 11/07/2015   AST 16 11/07/2015   NA 141 11/08/2015   K 3.8 11/08/2015   CL 105 11/08/2015   CREATININE 0.64 11/08/2015   BUN 9 11/08/2015   CO2 28 11/08/2015   TSH 0.763 01/23/2012   INR 1.09 11/07/2015   HGBA1C 8.3 (H) 07/14/2013    PT/INR:  Recent Labs  11/07/15 2007  LABPROT 14.2  INR 1.09   Radiology: No results found.   Assessment/Plan: Plan CABG in am  The goals risks and alternatives of the planned surgical procedure CABG  have been discussed with the patient in detail. The risks of the procedure including death, infection, stroke, myocardial infarction, bleeding, blood transfusion have all been discussed specifically.  I have quoted Candace [name] a 3 % of perioperative mortality and a complication rate as high as 25%. The patient's questions have been answered.Candace [name] is willing  to proceed with the planned procedure.   [name] 11/09/2015 10:01 AM

## 2015-11-10 ENCOUNTER — Inpatient Hospital Stay (HOSPITAL_COMMUNITY): Payer: BLUE CROSS/BLUE SHIELD | Admitting: Anesthesiology

## 2015-11-10 ENCOUNTER — Inpatient Hospital Stay (HOSPITAL_COMMUNITY): Payer: BLUE CROSS/BLUE SHIELD

## 2015-11-10 ENCOUNTER — Encounter (HOSPITAL_COMMUNITY)
Admission: AD | Disposition: A | Discharge status: Home Health | Payer: Self-pay | Source: Other Acute Inpatient Hospital | Attending: Cardiothoracic Surgery

## 2015-11-10 ENCOUNTER — Encounter (HOSPITAL_COMMUNITY): Payer: Self-pay | Admitting: Certified Registered"

## 2015-11-10 ENCOUNTER — Inpatient Hospital Stay (HOSPITAL_COMMUNITY)
Admission: RE | Admit: 2015-11-10 | Payer: BLUE CROSS/BLUE SHIELD | Source: Ambulatory Visit | Admitting: Cardiothoracic Surgery

## 2015-11-10 DIAGNOSIS — Z951 Presence of aortocoronary bypass graft: Secondary | ICD-10-CM | POA: Insufficient documentation

## 2015-11-10 DIAGNOSIS — I959 Hypotension, unspecified: Secondary | ICD-10-CM

## 2015-11-10 DIAGNOSIS — J9601 Acute respiratory failure with hypoxia: Secondary | ICD-10-CM

## 2015-11-10 DIAGNOSIS — J441 Chronic obstructive pulmonary disease with (acute) exacerbation: Secondary | ICD-10-CM

## 2015-11-10 HISTORY — PX: CORONARY ARTERY BYPASS GRAFT: SHX141

## 2015-11-10 HISTORY — PX: TEE WITHOUT CARDIOVERSION: SHX5443

## 2015-11-10 LAB — POCT I-STAT 7, (LYTES, BLD GAS, ICA,H+H)
Acid-Base Excess: 3 mmol/L — ABNORMAL HIGH (ref 0.0–2.0)
Bicarbonate: 29.5 mEq/L — ABNORMAL HIGH (ref 20.0–24.0)
Calcium, Ion: 1.3 mmol/L — ABNORMAL HIGH (ref 1.12–1.23)
HCT: 33 % — ABNORMAL LOW (ref 36.0–46.0)
Hemoglobin: 11.2 g/dL — ABNORMAL LOW (ref 12.0–15.0)
O2 Saturation: 94 %
Potassium: 4.1 mmol/L (ref 3.5–5.1)
Sodium: 142 mmol/L (ref 135–145)
TCO2: 31 mmol/L (ref 0–100)
pCO2 arterial: 54.3 mmHg — ABNORMAL HIGH (ref 35.0–45.0)
pH, Arterial: 7.343 — ABNORMAL LOW (ref 7.350–7.450)
pO2, Arterial: 75 mmHg — ABNORMAL LOW (ref 80.0–100.0)

## 2015-11-10 LAB — BASIC METABOLIC PANEL
Anion gap: 8 (ref 5–15)
BUN: <5 mg/dL — ABNORMAL LOW (ref 6–20)
CO2: 26 mmol/L (ref 22–32)
Calcium: 9.3 mg/dL (ref 8.9–10.3)
Chloride: 106 mmol/L (ref 101–111)
Creatinine, Ser: 0.63 mg/dL (ref 0.44–1.00)
GFR calc Af Amer: >60 mL/min (ref >60)
GFR calc non Af Amer: >60 mL/min (ref >60)
Glucose, Bld: 89 mg/dL (ref 65–99)
Potassium: 4.1 mmol/L (ref 3.5–5.1)
Sodium: 140 mmol/L (ref 135–145)

## 2015-11-10 LAB — CBC
HCT: 39.7 % (ref 36.0–46.0)
HCT: 34.6 % — ABNORMAL LOW (ref 36.0–46.0)
HCT: 29 % — ABNORMAL LOW (ref 36.0–46.0)
Hemoglobin: 12.8 g/dL (ref 12.0–15.0)
Hemoglobin: 11.6 g/dL — ABNORMAL LOW (ref 12.0–15.0)
Hemoglobin: 10 g/dL — ABNORMAL LOW (ref 12.0–15.0)
MCH: 30.7 pg (ref 26.0–34.0)
MCH: 30.9 pg (ref 26.0–34.0)
MCH: 31.2 pg (ref 26.0–34.0)
MCHC: 32.2 g/dL (ref 30.0–36.0)
MCHC: 33.5 g/dL (ref 30.0–36.0)
MCHC: 34.5 g/dL (ref 30.0–36.0)
MCV: 95.2 fL (ref 78.0–100.0)
MCV: 92 fL (ref 78.0–100.0)
MCV: 90.3 fL (ref 78.0–100.0)
Platelets: 176 10*3/uL (ref 150–400)
Platelets: 102 10*3/uL — ABNORMAL LOW (ref 150–400)
Platelets: 117 10*3/uL — ABNORMAL LOW (ref 150–400)
RBC: 4.17 MIL/uL (ref 3.87–5.11)
RBC: 3.76 MIL/uL — ABNORMAL LOW (ref 3.87–5.11)
RBC: 3.21 MIL/uL — ABNORMAL LOW (ref 3.87–5.11)
RDW: 12.3 % (ref 11.5–15.5)
RDW: 12.6 % (ref 11.5–15.5)
RDW: 12.9 % (ref 11.5–15.5)
WBC: 7.4 10*3/uL (ref 4.0–10.5)
WBC: 8.7 10*3/uL (ref 4.0–10.5)
WBC: 9.2 10*3/uL (ref 4.0–10.5)

## 2015-11-10 LAB — POCT I-STAT 3, ART BLOOD GAS (G3+)
Acid-Base Excess: 1 mmol/L (ref 0.0–2.0)
Acid-Base Excess: 1 mmol/L (ref 0.0–2.0)
Acid-Base Excess: 1 mmol/L (ref 0.0–2.0)
Acid-Base Excess: 6 mmol/L — ABNORMAL HIGH (ref 0.0–2.0)
Bicarbonate: 24.9 mEq/L — ABNORMAL HIGH (ref 20.0–24.0)
Bicarbonate: 25.9 mEq/L — ABNORMAL HIGH (ref 20.0–24.0)
Bicarbonate: 27.7 mEq/L — ABNORMAL HIGH (ref 20.0–24.0)
Bicarbonate: 29.2 mEq/L — ABNORMAL HIGH (ref 20.0–24.0)
O2 Saturation: 100 %
O2 Saturation: 100 %
O2 Saturation: 93 %
O2 Saturation: 94 %
Patient temperature: 36.5
TCO2: 26 mmol/L (ref 0–100)
TCO2: 27 mmol/L (ref 0–100)
TCO2: 29 mmol/L (ref 0–100)
TCO2: 30 mmol/L (ref 0–100)
pCO2 arterial: 37.2 mmHg (ref 35.0–45.0)
pCO2 arterial: 37.5 mmHg (ref 35.0–45.0)
pCO2 arterial: 42.2 mmHg (ref 35.0–45.0)
pCO2 arterial: 51.8 mmHg — ABNORMAL HIGH (ref 35.0–45.0)
pH, Arterial: 7.335 — ABNORMAL LOW (ref 7.350–7.450)
pH, Arterial: 7.393 (ref 7.350–7.450)
pH, Arterial: 7.434 (ref 7.350–7.450)
pH, Arterial: 7.5 — ABNORMAL HIGH (ref 7.350–7.450)
pO2, Arterial: 291 mmHg — ABNORMAL HIGH (ref 80.0–100.0)
pO2, Arterial: 428 mmHg — ABNORMAL HIGH (ref 80.0–100.0)
pO2, Arterial: 66 mmHg — ABNORMAL LOW (ref 80.0–100.0)
pO2, Arterial: 68 mmHg — ABNORMAL LOW (ref 80.0–100.0)

## 2015-11-10 LAB — POCT I-STAT, CHEM 8
BUN: <3 mg/dL — ABNORMAL LOW (ref 6–20)
BUN: <3 mg/dL — ABNORMAL LOW (ref 6–20)
BUN: <3 mg/dL — ABNORMAL LOW (ref 6–20)
BUN: <3 mg/dL — ABNORMAL LOW (ref 6–20)
BUN: <3 mg/dL — ABNORMAL LOW (ref 6–20)
BUN: <3 mg/dL — ABNORMAL LOW (ref 6–20)
BUN: <3 mg/dL — ABNORMAL LOW (ref 6–20)
BUN: <3 mg/dL — ABNORMAL LOW (ref 6–20)
BUN: <3 mg/dL — ABNORMAL LOW (ref 6–20)
Calcium, Ion: 1.2 mmol/L (ref 1.12–1.23)
Calcium, Ion: 1.16 mmol/L (ref 1.12–1.23)
Calcium, Ion: 1.23 mmol/L (ref 1.12–1.23)
Calcium, Ion: 1 mmol/L — ABNORMAL LOW (ref 1.12–1.23)
Calcium, Ion: 1.22 mmol/L (ref 1.12–1.23)
Calcium, Ion: 1.02 mmol/L — ABNORMAL LOW (ref 1.12–1.23)
Calcium, Ion: 1.08 mmol/L — ABNORMAL LOW (ref 1.12–1.23)
Calcium, Ion: 1.04 mmol/L — ABNORMAL LOW (ref 1.12–1.23)
Calcium, Ion: 1.21 mmol/L (ref 1.12–1.23)
Chloride: 102 mmol/L (ref 101–111)
Chloride: 101 mmol/L (ref 101–111)
Chloride: 103 mmol/L (ref 101–111)
Chloride: 103 mmol/L (ref 101–111)
Chloride: 97 mmol/L — ABNORMAL LOW (ref 101–111)
Chloride: 100 mmol/L — ABNORMAL LOW (ref 101–111)
Chloride: 97 mmol/L — ABNORMAL LOW (ref 101–111)
Chloride: 101 mmol/L (ref 101–111)
Chloride: 106 mmol/L (ref 101–111)
Creatinine, Ser: 0.4 mg/dL — ABNORMAL LOW (ref 0.44–1.00)
Creatinine, Ser: 0.4 mg/dL — ABNORMAL LOW (ref 0.44–1.00)
Creatinine, Ser: 0.3 mg/dL — ABNORMAL LOW (ref 0.44–1.00)
Creatinine, Ser: 0.3 mg/dL — ABNORMAL LOW (ref 0.44–1.00)
Creatinine, Ser: 0.4 mg/dL — ABNORMAL LOW (ref 0.44–1.00)
Creatinine, Ser: 0.4 mg/dL — ABNORMAL LOW (ref 0.44–1.00)
Creatinine, Ser: 0.4 mg/dL — ABNORMAL LOW (ref 0.44–1.00)
Creatinine, Ser: 0.5 mg/dL (ref 0.44–1.00)
Creatinine, Ser: 0.5 mg/dL (ref 0.44–1.00)
Glucose, Bld: 131 mg/dL — ABNORMAL HIGH (ref 65–99)
Glucose, Bld: 142 mg/dL — ABNORMAL HIGH (ref 65–99)
Glucose, Bld: 120 mg/dL — ABNORMAL HIGH (ref 65–99)
Glucose, Bld: 113 mg/dL — ABNORMAL HIGH (ref 65–99)
Glucose, Bld: 98 mg/dL (ref 65–99)
Glucose, Bld: 112 mg/dL — ABNORMAL HIGH (ref 65–99)
Glucose, Bld: 118 mg/dL — ABNORMAL HIGH (ref 65–99)
Glucose, Bld: 139 mg/dL — ABNORMAL HIGH (ref 65–99)
Glucose, Bld: 107 mg/dL — ABNORMAL HIGH (ref 65–99)
HCT: 32 % — ABNORMAL LOW (ref 36.0–46.0)
HCT: 28 % — ABNORMAL LOW (ref 36.0–46.0)
HCT: 29 % — ABNORMAL LOW (ref 36.0–46.0)
HCT: 22 % — ABNORMAL LOW (ref 36.0–46.0)
HCT: 28 % — ABNORMAL LOW (ref 36.0–46.0)
HCT: 21 % — ABNORMAL LOW (ref 36.0–46.0)
HCT: 23 % — ABNORMAL LOW (ref 36.0–46.0)
HCT: 25 % — ABNORMAL LOW (ref 36.0–46.0)
HCT: 28 % — ABNORMAL LOW (ref 36.0–46.0)
Hemoglobin: 10.9 g/dL — ABNORMAL LOW (ref 12.0–15.0)
Hemoglobin: 9.5 g/dL — ABNORMAL LOW (ref 12.0–15.0)
Hemoglobin: 9.9 g/dL — ABNORMAL LOW (ref 12.0–15.0)
Hemoglobin: 7.5 g/dL — ABNORMAL LOW (ref 12.0–15.0)
Hemoglobin: 9.5 g/dL — ABNORMAL LOW (ref 12.0–15.0)
Hemoglobin: 7.1 g/dL — ABNORMAL LOW (ref 12.0–15.0)
Hemoglobin: 7.8 g/dL — ABNORMAL LOW (ref 12.0–15.0)
Hemoglobin: 8.5 g/dL — ABNORMAL LOW (ref 12.0–15.0)
Hemoglobin: 9.5 g/dL — ABNORMAL LOW (ref 12.0–15.0)
Potassium: 4 mmol/L (ref 3.5–5.1)
Potassium: 3.7 mmol/L (ref 3.5–5.1)
Potassium: 3.5 mmol/L (ref 3.5–5.1)
Potassium: 3.3 mmol/L — ABNORMAL LOW (ref 3.5–5.1)
Potassium: 3.5 mmol/L (ref 3.5–5.1)
Potassium: 3.5 mmol/L (ref 3.5–5.1)
Potassium: 3.5 mmol/L (ref 3.5–5.1)
Potassium: 3.6 mmol/L (ref 3.5–5.1)
Potassium: 5.3 mmol/L — ABNORMAL HIGH (ref 3.5–5.1)
Sodium: 141 mmol/L (ref 135–145)
Sodium: 142 mmol/L (ref 135–145)
Sodium: 142 mmol/L (ref 135–145)
Sodium: 140 mmol/L (ref 135–145)
Sodium: 143 mmol/L (ref 135–145)
Sodium: 136 mmol/L (ref 135–145)
Sodium: 141 mmol/L (ref 135–145)
Sodium: 140 mmol/L (ref 135–145)
Sodium: 142 mmol/L (ref 135–145)
TCO2: 30 mmol/L (ref 0–100)
TCO2: 32 mmol/L (ref 0–100)
TCO2: 30 mmol/L (ref 0–100)
TCO2: 30 mmol/L (ref 0–100)
TCO2: 31 mmol/L (ref 0–100)
TCO2: 28 mmol/L (ref 0–100)
TCO2: 29 mmol/L (ref 0–100)
TCO2: 26 mmol/L (ref 0–100)
TCO2: 29 mmol/L (ref 0–100)

## 2015-11-10 LAB — PULMONARY FUNCTION TEST
FEF 25-75 Pre: 0.69 L/sec
FEF2575-%Pred-Pre: 37 %
FEV1-%Pred-Pre: 44 %
FEV1-Pre: 0.83 L
FEV1FVC-%Pred-Pre: 96 %
FEV6-%Pred-Pre: 46 %
FEV6-Pre: 1.09 L
FEV6FVC-%Pred-Pre: 103 %
FVC-%Pred-Pre: 45 %
FVC-Pre: 1.1 L
Pre FEV1/FVC ratio: 76 %
Pre FEV6/FVC Ratio: 100 %

## 2015-11-10 LAB — HEMOGLOBIN A1C
Hgb A1c MFr Bld: 8.2 % — ABNORMAL HIGH (ref 4.8–5.6)
Hgb A1c MFr Bld: 8.2 % — ABNORMAL HIGH (ref 4.8–5.6)
Mean Plasma Glucose: 189 mg/dL
Mean Plasma Glucose: 189 mg/dL

## 2015-11-10 LAB — POCT I-STAT 4, (NA,K, GLUC, HGB,HCT)
Glucose, Bld: 145 mg/dL — ABNORMAL HIGH (ref 65–99)
HCT: 33 % — ABNORMAL LOW (ref 36.0–46.0)
Hemoglobin: 11.2 g/dL — ABNORMAL LOW (ref 12.0–15.0)
Potassium: 3.4 mmol/L — ABNORMAL LOW (ref 3.5–5.1)
Sodium: 144 mmol/L (ref 135–145)

## 2015-11-10 LAB — MAGNESIUM: Magnesium: 2.6 mg/dL — ABNORMAL HIGH (ref 1.7–2.4)

## 2015-11-10 LAB — HEPARIN LEVEL (UNFRACTIONATED): Heparin Unfractionated: 0.48 IU/mL (ref 0.30–0.70)

## 2015-11-10 LAB — HEMOGLOBIN AND HEMATOCRIT, BLOOD
HCT: 20.6 % — ABNORMAL LOW (ref 36.0–46.0)
Hemoglobin: 6.9 g/dL — CL (ref 12.0–15.0)

## 2015-11-10 LAB — PROTIME-INR
INR: 1.44
Prothrombin Time: 17.7 seconds — ABNORMAL HIGH (ref 11.4–15.2)

## 2015-11-10 LAB — PREPARE RBC (CROSSMATCH)

## 2015-11-10 LAB — CREATININE, SERUM
Creatinine, Ser: 0.63 mg/dL (ref 0.44–1.00)
GFR calc Af Amer: >60 mL/min (ref >60)
GFR calc non Af Amer: >60 mL/min (ref >60)

## 2015-11-10 LAB — PLATELET COUNT: Platelets: 90 10*3/uL — ABNORMAL LOW (ref 150–400)

## 2015-11-10 LAB — GLUCOSE, CAPILLARY
Glucose-Capillary: 144 mg/dL — ABNORMAL HIGH (ref 65–99)
Glucose-Capillary: 115 mg/dL — ABNORMAL HIGH (ref 65–99)
Glucose-Capillary: 79 mg/dL (ref 65–99)
Glucose-Capillary: 112 mg/dL — ABNORMAL HIGH (ref 65–99)
Glucose-Capillary: 128 mg/dL — ABNORMAL HIGH (ref 65–99)

## 2015-11-10 LAB — APTT: aPTT: 37 seconds — ABNORMAL HIGH (ref 24–36)

## 2015-11-10 SURGERY — CORONARY ARTERY BYPASS GRAFTING (CABG)
Anesthesia: General | Site: Chest

## 2015-11-10 MED ORDER — SODIUM CHLORIDE 0.9 % IJ SOLN
INTRAMUSCULAR | Status: DC | PRN
  Administered 2015-11-10 (×3): 4 mL via TOPICAL

## 2015-11-10 MED ORDER — HEMOSTATIC AGENTS (NO CHARGE) OPTIME
TOPICAL | Status: DC | PRN
  Administered 2015-11-10 (×2): 1 via TOPICAL

## 2015-11-10 MED ORDER — FENTANYL CITRATE (PF) 250 MCG/5ML IJ SOLN
INTRAMUSCULAR | Status: AC
  Filled 2015-11-10: qty 5

## 2015-11-10 MED ORDER — POTASSIUM CHLORIDE 10 MEQ/50ML IV SOLN
10.0000 meq | INTRAVENOUS | Status: AC
Start: 2015-11-10 — End: 2015-11-10
  Administered 2015-11-10 (×2): 10 meq via INTRAVENOUS

## 2015-11-10 MED ORDER — ALBUTEROL SULFATE HFA 108 (90 BASE) MCG/ACT IN AERS
INHALATION_SPRAY | RESPIRATORY_TRACT | Status: DC | PRN
  Administered 2015-11-10: 2 via RESPIRATORY_TRACT

## 2015-11-10 MED ORDER — PROTAMINE SULFATE 10 MG/ML IV SOLN
INTRAVENOUS | Status: AC
  Filled 2015-11-10: qty 25

## 2015-11-10 MED ORDER — SODIUM CHLORIDE 0.9 % IV SOLN
1.0000 g | Freq: Once | INTRAVENOUS | Status: DC
  Filled 2015-11-10: qty 10

## 2015-11-10 MED ORDER — LACTATED RINGERS IV SOLN: 500.0000 mL | Freq: Once | INTRAVENOUS | Status: DC | PRN

## 2015-11-10 MED ORDER — POTASSIUM CHLORIDE 10 MEQ/50ML IV SOLN
10.0000 meq | INTRAVENOUS | Status: AC
  Administered 2015-11-10 (×3): 10 meq via INTRAVENOUS

## 2015-11-10 MED ORDER — BISACODYL 5 MG PO TBEC
10.0000 mg | DELAYED_RELEASE_TABLET | Freq: Every day | ORAL | Status: DC
  Administered 2015-11-11: 10 mg via ORAL
  Filled 2015-11-10: qty 2

## 2015-11-10 MED ORDER — ASPIRIN 81 MG PO CHEW: 324.0000 mg | CHEWABLE_TABLET | Freq: Every day | ORAL | Status: DC

## 2015-11-10 MED ORDER — SODIUM CHLORIDE 0.9% FLUSH: 3.0000 mL | INTRAVENOUS | Status: DC | PRN

## 2015-11-10 MED ORDER — VANCOMYCIN HCL IN DEXTROSE 1-5 GM/200ML-% IV SOLN
1000.0000 mg | Freq: Once | INTRAVENOUS | Status: AC
  Administered 2015-11-10: 1000 mg via INTRAVENOUS
  Filled 2015-11-10 (×2): qty 200

## 2015-11-10 MED ORDER — ONDANSETRON HCL 4 MG/2ML IJ SOLN
4.0000 mg | Freq: Four times a day (QID) | INTRAMUSCULAR | Status: DC | PRN
  Administered 2015-11-11: 4 mg via INTRAVENOUS
  Filled 2015-11-10: qty 2

## 2015-11-10 MED ORDER — PHENYLEPHRINE HCL 10 MG/ML IJ SOLN
0.0000 ug/min | INTRAVENOUS | Status: DC
  Administered 2015-11-11: 20 ug/min via INTRAVENOUS
  Filled 2015-11-10 (×2): qty 2

## 2015-11-10 MED ORDER — ASPIRIN EC 325 MG PO TBEC
325.0000 mg | DELAYED_RELEASE_TABLET | Freq: Every day | ORAL | Status: DC
Start: 2015-11-11 — End: 2015-11-12
  Administered 2015-11-11: 325 mg via ORAL
  Filled 2015-11-10: qty 1

## 2015-11-10 MED ORDER — ROCURONIUM BROMIDE 10 MG/ML (PF) SYRINGE
PREFILLED_SYRINGE | INTRAVENOUS | Status: AC
  Filled 2015-11-10: qty 10

## 2015-11-10 MED ORDER — ARTIFICIAL TEARS OP OINT
TOPICAL_OINTMENT | OPHTHALMIC | Status: DC | PRN
  Administered 2015-11-10: 1 via OPHTHALMIC

## 2015-11-10 MED ORDER — SODIUM CHLORIDE 0.9 % IV SOLN: 250.0000 mL | INTRAVENOUS | Status: DC

## 2015-11-10 MED ORDER — PANTOPRAZOLE SODIUM 40 MG PO TBEC: 40.0000 mg | DELAYED_RELEASE_TABLET | Freq: Every day | ORAL | Status: DC

## 2015-11-10 MED ORDER — PROPOFOL 10 MG/ML IV BOLUS
INTRAVENOUS | Status: DC | PRN
  Administered 2015-11-10: 70 mg via INTRAVENOUS
  Administered 2015-11-10: 80 mg via INTRAVENOUS

## 2015-11-10 MED ORDER — ALBUMIN HUMAN 5 % IV SOLN
12.5000 g | Freq: Once | INTRAVENOUS | Status: AC
  Administered 2015-11-10: 12.5 g via INTRAVENOUS

## 2015-11-10 MED ORDER — HEPARIN SODIUM (PORCINE) 1000 UNIT/ML IJ SOLN
INTRAMUSCULAR | Status: AC
  Filled 2015-11-10: qty 1

## 2015-11-10 MED ORDER — DEXMEDETOMIDINE HCL IN NACL 200 MCG/50ML IV SOLN
0.0000 ug/kg/h | INTRAVENOUS | Status: DC
  Filled 2015-11-10: qty 50

## 2015-11-10 MED ORDER — MIDAZOLAM HCL 10 MG/2ML IJ SOLN
INTRAMUSCULAR | Status: AC
  Filled 2015-11-10: qty 2

## 2015-11-10 MED ORDER — DEXMEDETOMIDINE HCL IN NACL 200 MCG/50ML IV SOLN
INTRAVENOUS | Status: AC
  Filled 2015-11-10: qty 50

## 2015-11-10 MED ORDER — ARTIFICIAL TEARS OP OINT
TOPICAL_OINTMENT | OPHTHALMIC | Status: AC
  Filled 2015-11-10: qty 3.5

## 2015-11-10 MED ORDER — SODIUM CHLORIDE 0.9 % IV SOLN
INTRAVENOUS | Status: DC
  Administered 2015-11-10: 1.4 [IU]/h via INTRAVENOUS
  Filled 2015-11-10 (×2): qty 2.5

## 2015-11-10 MED ORDER — ACETAMINOPHEN 650 MG RE SUPP
650.0000 mg | Freq: Once | RECTAL | Status: AC
  Administered 2015-11-10: 650 mg via RECTAL

## 2015-11-10 MED ORDER — SODIUM CHLORIDE 0.9 % IV SOLN: INTRAVENOUS | Status: DC

## 2015-11-10 MED ORDER — LIDOCAINE 2% (20 MG/ML) 5 ML SYRINGE
INTRAMUSCULAR | Status: DC | PRN
  Administered 2015-11-10: 100 mg via INTRAVENOUS

## 2015-11-10 MED ORDER — HEPARIN SODIUM (PORCINE) 1000 UNIT/ML IJ SOLN
INTRAMUSCULAR | Status: DC | PRN
  Administered 2015-11-10: 26000 [IU] via INTRAVENOUS

## 2015-11-10 MED ORDER — SODIUM CHLORIDE 0.9% FLUSH
3.0000 mL | Freq: Two times a day (BID) | INTRAVENOUS | Status: DC
  Administered 2015-11-11 (×2): 3 mL via INTRAVENOUS

## 2015-11-10 MED ORDER — BISACODYL 10 MG RE SUPP: 10.0000 mg | Freq: Every day | RECTAL | Status: DC

## 2015-11-10 MED ORDER — DOCUSATE SODIUM 100 MG PO CAPS
200.0000 mg | ORAL_CAPSULE | Freq: Every day | ORAL | Status: DC
  Administered 2015-11-11: 200 mg via ORAL
  Filled 2015-11-10: qty 2

## 2015-11-10 MED ORDER — LACTATED RINGERS IV SOLN: INTRAVENOUS | Status: DC

## 2015-11-10 MED ORDER — LACTATED RINGERS IV SOLN
INTRAVENOUS | Status: DC | PRN
  Administered 2015-11-10 (×5): via INTRAVENOUS

## 2015-11-10 MED ORDER — ACETAMINOPHEN 500 MG PO TABS
1000.0000 mg | ORAL_TABLET | Freq: Four times a day (QID) | ORAL | Status: DC
  Administered 2015-11-11 – 2015-11-12 (×5): 1000 mg via ORAL
  Filled 2015-11-10 (×5): qty 2

## 2015-11-10 MED ORDER — METOPROLOL TARTRATE 25 MG/10 ML ORAL SUSPENSION: 12.5000 mg | Freq: Two times a day (BID) | ORAL | Status: DC

## 2015-11-10 MED ORDER — ALBUTEROL SULFATE HFA 108 (90 BASE) MCG/ACT IN AERS
INHALATION_SPRAY | RESPIRATORY_TRACT | Status: AC
  Filled 2015-11-10: qty 6.7

## 2015-11-10 MED ORDER — SODIUM CHLORIDE 0.45 % IV SOLN
INTRAVENOUS | Status: DC | PRN
  Administered 2015-11-10: 20 mL via INTRAVENOUS

## 2015-11-10 MED ORDER — 0.9 % SODIUM CHLORIDE (POUR BTL) OPTIME
TOPICAL | Status: DC | PRN
  Administered 2015-11-10: 1000 mL

## 2015-11-10 MED ORDER — MORPHINE SULFATE (PF) 2 MG/ML IV SOLN
2.0000 mg | INTRAVENOUS | Status: DC | PRN
  Administered 2015-11-11: 4 mg via INTRAVENOUS
  Administered 2015-11-11 (×5): 2 mg via INTRAVENOUS
  Filled 2015-11-10: qty 1
  Filled 2015-11-10: qty 2
  Filled 2015-11-10 (×4): qty 1

## 2015-11-10 MED ORDER — INSULIN REGULAR BOLUS VIA INFUSION
0.0000 [IU] | Freq: Three times a day (TID) | INTRAVENOUS | Status: DC
  Filled 2015-11-10: qty 10

## 2015-11-10 MED ORDER — OXYCODONE HCL 5 MG PO TABS
5.0000 mg | ORAL_TABLET | ORAL | Status: DC | PRN
  Administered 2015-11-11: 5 mg via ORAL
  Filled 2015-11-10: qty 1

## 2015-11-10 MED ORDER — ROCURONIUM BROMIDE 10 MG/ML (PF) SYRINGE
PREFILLED_SYRINGE | INTRAVENOUS | Status: DC | PRN
  Administered 2015-11-10 (×2): 50 mg via INTRAVENOUS
  Administered 2015-11-10: 60 mg via INTRAVENOUS
  Administered 2015-11-10: 50 mg via INTRAVENOUS
  Administered 2015-11-10: 40 mg via INTRAVENOUS
  Administered 2015-11-10: 50 mg via INTRAVENOUS

## 2015-11-10 MED ORDER — MIDAZOLAM HCL 5 MG/5ML IJ SOLN
INTRAMUSCULAR | Status: DC | PRN
  Administered 2015-11-10: 1 mg via INTRAVENOUS
  Administered 2015-11-10 (×2): 2 mg via INTRAVENOUS
  Administered 2015-11-10: 1 mg via INTRAVENOUS
  Administered 2015-11-10 (×2): 2 mg via INTRAVENOUS

## 2015-11-10 MED ORDER — LEVALBUTEROL HCL 0.63 MG/3ML IN NEBU
0.6300 mg | INHALATION_SOLUTION | Freq: Four times a day (QID) | RESPIRATORY_TRACT | Status: DC
  Administered 2015-11-10 – 2015-11-11 (×3): 0.63 mg via RESPIRATORY_TRACT
  Filled 2015-11-10 (×3): qty 3

## 2015-11-10 MED ORDER — MAGNESIUM SULFATE 4 GM/100ML IV SOLN
4.0000 g | Freq: Once | INTRAVENOUS | Status: AC
  Administered 2015-11-10: 4 g via INTRAVENOUS
  Filled 2015-11-10: qty 100

## 2015-11-10 MED ORDER — ALBUMIN HUMAN 5 % IV SOLN
250.0000 mL | INTRAVENOUS | Status: AC | PRN
  Administered 2015-11-10 – 2015-11-11 (×3): 250 mL via INTRAVENOUS
  Filled 2015-11-10 (×3): qty 250

## 2015-11-10 MED ORDER — FENTANYL CITRATE (PF) 250 MCG/5ML IJ SOLN
INTRAMUSCULAR | Status: AC
  Filled 2015-11-10: qty 20

## 2015-11-10 MED ORDER — ACETAMINOPHEN 160 MG/5ML PO SOLN: 650.0000 mg | Freq: Once | ORAL | Status: AC

## 2015-11-10 MED ORDER — FAMOTIDINE IN NACL 20-0.9 MG/50ML-% IV SOLN
20.0000 mg | Freq: Two times a day (BID) | INTRAVENOUS | Status: AC
  Administered 2015-11-10 – 2015-11-11 (×2): 20 mg via INTRAVENOUS
  Filled 2015-11-10: qty 50

## 2015-11-10 MED ORDER — PROPOFOL 10 MG/ML IV BOLUS
INTRAVENOUS | Status: AC
  Filled 2015-11-10: qty 20

## 2015-11-10 MED ORDER — FENTANYL CITRATE (PF) 100 MCG/2ML IJ SOLN
INTRAMUSCULAR | Status: DC | PRN
  Administered 2015-11-10: 100 ug via INTRAVENOUS
  Administered 2015-11-10: 150 ug via INTRAVENOUS
  Administered 2015-11-10 (×2): 250 ug via INTRAVENOUS
  Administered 2015-11-10: 300 ug via INTRAVENOUS
  Administered 2015-11-10: 50 ug via INTRAVENOUS
  Administered 2015-11-10 (×2): 250 ug via INTRAVENOUS
  Administered 2015-11-10: 150 ug via INTRAVENOUS

## 2015-11-10 MED ORDER — ACETAMINOPHEN 160 MG/5ML PO SOLN: 1000.0000 mg | Freq: Four times a day (QID) | ORAL | Status: DC

## 2015-11-10 MED ORDER — METOPROLOL TARTRATE 5 MG/5ML IV SOLN: 2.5000 mg | INTRAVENOUS | Status: DC | PRN

## 2015-11-10 MED ORDER — LIDOCAINE 2% (20 MG/ML) 5 ML SYRINGE
INTRAMUSCULAR | Status: AC
  Filled 2015-11-10: qty 5

## 2015-11-10 MED ORDER — METOPROLOL TARTRATE 12.5 MG HALF TABLET: 12.5000 mg | ORAL_TABLET | Freq: Two times a day (BID) | ORAL | Status: DC

## 2015-11-10 MED ORDER — NITROGLYCERIN IN D5W 200-5 MCG/ML-% IV SOLN
0.0000 ug/min | INTRAVENOUS | Status: DC
  Administered 2015-11-10: 5 ug/min via INTRAVENOUS
  Filled 2015-11-10: qty 250

## 2015-11-10 MED ORDER — SODIUM CHLORIDE 0.9 % IV SOLN
INTRAVENOUS | Status: DC | PRN
  Administered 2015-11-10: 15:00:00 via INTRAVENOUS

## 2015-11-10 MED ORDER — CALCIUM GLUCONATE 10 % IV SOLN: 1.0000 g | Freq: Once | INTRAVENOUS | Status: DC

## 2015-11-10 MED ORDER — TRAMADOL HCL 50 MG PO TABS
50.0000 mg | ORAL_TABLET | ORAL | Status: DC | PRN
  Administered 2015-11-11: 50 mg via ORAL
  Filled 2015-11-10: qty 1

## 2015-11-10 MED ORDER — PROTAMINE SULFATE 10 MG/ML IV SOLN
INTRAVENOUS | Status: DC | PRN
  Administered 2015-11-10 (×2): 25 mg via INTRAVENOUS
  Administered 2015-11-10 (×2): 50 mg via INTRAVENOUS
  Administered 2015-11-10: 25 mg via INTRAVENOUS
  Administered 2015-11-10: 50 mg via INTRAVENOUS
  Administered 2015-11-10: 25 mg via INTRAVENOUS

## 2015-11-10 MED ORDER — MORPHINE SULFATE (PF) 2 MG/ML IV SOLN
1.0000 mg | INTRAVENOUS | Status: AC | PRN
  Administered 2015-11-10 (×2): 1 mg via INTRAVENOUS
  Filled 2015-11-10: qty 1

## 2015-11-10 MED ORDER — CHLORHEXIDINE GLUCONATE 0.12 % MT SOLN
15.0000 mL | OROMUCOSAL | Status: AC
  Administered 2015-11-10: 15 mL via OROMUCOSAL

## 2015-11-10 MED ORDER — LEVOFLOXACIN IN D5W 750 MG/150ML IV SOLN
750.0000 mg | INTRAVENOUS | Status: AC
  Administered 2015-11-11: 750 mg via INTRAVENOUS
  Filled 2015-11-10: qty 150

## 2015-11-10 MED ORDER — MIDAZOLAM HCL 2 MG/2ML IJ SOLN: 2.0000 mg | INTRAMUSCULAR | Status: DC | PRN

## 2015-11-10 SURGICAL SUPPLY — 83 items
BAG DECANTER FOR FLEXI CONT (MISCELLANEOUS) ×3 IMPLANT
BANDAGE ELASTIC 4 VELCRO ST LF (GAUZE/BANDAGES/DRESSINGS) ×4 IMPLANT
BANDAGE ELASTIC 6 VELCRO ST LF (GAUZE/BANDAGES/DRESSINGS) ×4 IMPLANT
BLADE STERNUM SYSTEM 6 (BLADE) ×3 IMPLANT
BLADE SURG 11 STRL SS (BLADE) ×1 IMPLANT
BNDG GAUZE ELAST 4 BULKY (GAUZE/BANDAGES/DRESSINGS) ×4 IMPLANT
CANISTER SUCTION 2500CC (MISCELLANEOUS) ×3 IMPLANT
CANNULA MC2 2 STG 32/40 NON-V (CANNULA) IMPLANT
CANNULA VEN 2 STAGE (MISCELLANEOUS) ×1 IMPLANT
CANNULA VENOUS 2 STG 32/40 (CANNULA) ×1
CATH CPB KIT GERHARDT (MISCELLANEOUS) ×3 IMPLANT
CATH THORACIC 28FR (CATHETERS) ×3 IMPLANT
COVER SURGICAL LIGHT HANDLE (MISCELLANEOUS) ×1 IMPLANT
CRADLE DONUT ADULT HEAD (MISCELLANEOUS) ×3 IMPLANT
DRAIN CHANNEL 28F RND 3/8 FF (WOUND CARE) ×3 IMPLANT
DRAPE CARDIOVASCULAR INCISE (DRAPES) ×3
DRAPE SLUSH/WARMER DISC (DRAPES) ×3 IMPLANT
DRAPE SRG 135X102X78XABS (DRAPES) ×2 IMPLANT
DRSG AQUACEL AG ADV 3.5X14 (GAUZE/BANDAGES/DRESSINGS) ×3 IMPLANT
DRSG TEGADERM 4X4.75 (GAUZE/BANDAGES/DRESSINGS) ×1 IMPLANT
ELECT BLADE 4.0 EZ CLEAN MEGAD (MISCELLANEOUS) ×3
ELECT REM PT RETURN 9FT ADLT (ELECTROSURGICAL) ×6
ELECTRODE BLDE 4.0 EZ CLN MEGD (MISCELLANEOUS) ×2 IMPLANT
ELECTRODE REM PT RTRN 9FT ADLT (ELECTROSURGICAL) ×4 IMPLANT
FELT TEFLON 1X6 (Vascular Products) ×6 IMPLANT
GAUZE SPONGE 4X4 12PLY STRL (GAUZE/BANDAGES/DRESSINGS) ×7 IMPLANT
GLOVE BIO SURGEON STRL SZ 6.5 (GLOVE) ×16 IMPLANT
GLOVE BIO SURGEON STRL SZ7 (GLOVE) ×2 IMPLANT
GLOVE BIO SURGEON STRL SZ7.5 (GLOVE) ×1 IMPLANT
GLOVE BIO SURGEON STRL SZ8 (GLOVE) ×1 IMPLANT
GLOVE BIOGEL PI IND STRL 6 (GLOVE) IMPLANT
GLOVE BIOGEL PI IND STRL 6.5 (GLOVE) IMPLANT
GLOVE BIOGEL PI IND STRL 7.0 (GLOVE) IMPLANT
GLOVE BIOGEL PI INDICATOR 6 (GLOVE) ×1
GLOVE BIOGEL PI INDICATOR 6.5 (GLOVE) ×2
GLOVE BIOGEL PI INDICATOR 7.0 (GLOVE) ×1
GLOVE SURG SS PI 6.5 STRL IVOR (GLOVE) ×1 IMPLANT
GOWN STRL REUS W/ TWL LRG LVL3 (GOWN DISPOSABLE) ×8 IMPLANT
GOWN STRL REUS W/TWL LRG LVL3 (GOWN DISPOSABLE) ×36
HEMOSTAT POWDER SURGIFOAM 1G (HEMOSTASIS) ×9 IMPLANT
HEMOSTAT SURGICEL 2X14 (HEMOSTASIS) ×3 IMPLANT
KIT BASIN OR (CUSTOM PROCEDURE TRAY) ×3 IMPLANT
KIT CATH SUCT 8FR (CATHETERS) ×3 IMPLANT
KIT DRAINAGE VACCUM ASSIST (KITS) ×1 IMPLANT
KIT ROOM TURNOVER OR (KITS) ×3 IMPLANT
KIT SUCTION CATH 14FR (SUCTIONS) ×6 IMPLANT
KIT VASOVIEW 6 PRO VH 2400 (KITS) ×3 IMPLANT
LEAD PACING MYOCARDI (MISCELLANEOUS) ×3 IMPLANT
LOOP VESSEL MAXI BLUE (MISCELLANEOUS) ×1 IMPLANT
MARKER GRAFT CORONARY BYPASS (MISCELLANEOUS) ×9 IMPLANT
NS IRRIG 1000ML POUR BTL (IV SOLUTION) ×16 IMPLANT
PACK OPEN HEART (CUSTOM PROCEDURE TRAY) ×3 IMPLANT
PAD ARMBOARD 7.5X6 YLW CONV (MISCELLANEOUS) ×8 IMPLANT
PAD ELECT DEFIB RADIOL ZOLL (MISCELLANEOUS) ×3 IMPLANT
PENCIL BUTTON HOLSTER BLD 10FT (ELECTRODE) ×3 IMPLANT
PUNCH AORTIC ROT 4.0MM RCL 40 (MISCELLANEOUS) ×1 IMPLANT
SET CARDIOPLEGIA MPS 5001102 (MISCELLANEOUS) ×1 IMPLANT
SLEEVE SURGEON STRL (DRAPES) ×1 IMPLANT
SOLUTION ANTI FOG 6CC (MISCELLANEOUS) ×1 IMPLANT
SPONGE LAP 18X18 X RAY DECT (DISPOSABLE) ×3 IMPLANT
SURGIFLO W/THROMBIN 8M KIT (HEMOSTASIS) ×1 IMPLANT
SUT BONE WAX W31G (SUTURE) ×3 IMPLANT
SUT MNCRL AB 4-0 PS2 18 (SUTURE) ×2 IMPLANT
SUT PROLENE 3 0 SH1 36 (SUTURE) ×3 IMPLANT
SUT PROLENE 4 0 TF (SUTURE) ×6 IMPLANT
SUT PROLENE 6 0 C 1 30 (SUTURE) ×2 IMPLANT
SUT PROLENE 6 0 CC (SUTURE) ×9 IMPLANT
SUT PROLENE 7 0 BV 1 (SUTURE) ×2 IMPLANT
SUT PROLENE 7 0 BV1 MDA (SUTURE) ×4 IMPLANT
SUT PROLENE 8 0 BV175 6 (SUTURE) ×3 IMPLANT
SUT STEEL 6MS V (SUTURE) ×3 IMPLANT
SUT STEEL SZ 6 DBL 3X14 BALL (SUTURE) ×3 IMPLANT
SUT VIC AB 1 CTX 18 (SUTURE) ×6 IMPLANT
SUT VIC AB 2-0 CT1 27 (SUTURE) ×6
SUT VIC AB 2-0 CT1 TAPERPNT 27 (SUTURE) IMPLANT
SUTURE E-PAK OPEN HEART (SUTURE) ×3 IMPLANT
SYSTEM SAHARA CHEST DRAIN ATS (WOUND CARE) ×3 IMPLANT
TOWEL OR 17X24 6PK STRL BLUE (TOWEL DISPOSABLE) ×6 IMPLANT
TOWEL OR 17X26 10 PK STRL BLUE (TOWEL DISPOSABLE) ×6 IMPLANT
TRAY FOLEY IC TEMP SENS 16FR (CATHETERS) ×3 IMPLANT
TUBING INSUFFLATION (TUBING) ×3 IMPLANT
UNDERPAD 30X30 INCONTINENT (UNDERPADS AND DIAPERS) ×3 IMPLANT
WATER STERILE IRR 1000ML POUR (IV SOLUTION) ×6 IMPLANT

## 2015-11-10 NOTE — Brief Op Note (Addendum)
      [address]       [place] [address]             [phone]        11/10/2015  3:51 PM  PATIENT:  Candace [name]  65 y.o. female  PRE-OPERATIVE DIAGNOSIS:  CAD  POST-OPERATIVE DIAGNOSIS:  CAD  PROCEDURE:  Procedure(s) with comments: CORONARY ARTERY BYPASS GRAFTING (CABG), ON PUMP, TIMES FOUR, USING LEFT INTERNAL MAMMARY ARTERY, BILATERAL GREATER SAPHENOUS VEINS HARVESTED ENDOSCOPICALLY (N/A) - LIMA-LAD; SEQ SVG-OM1-OM2; SVG-PL TRANSESOPHAGEAL ECHOCARDIOGRAM (TEE) (N/A) LIMA-LAD; SEQ SVG-OM1-OM2; SVG-PL  SURGEON:  Surgeon(s) and Role:    * [name], MD - Primary  PHYSICIAN ASSISTANT: WAYNE GOLD PA-C; TESSA CONTE PA-C  ANESTHESIA:   general  EBL:  Total I/O In: 2920 [I.V.:2500; Blood:420] Out: [id number] [Urine:2850; Blood:1625]  BLOOD ADMINISTERED:SEE ANEST /PERFUSION RECORDS  DRAINS: ROUTINE   LOCAL MEDICATIONS USED:  NONE  SPECIMEN:  Source of Specimen:  LIPOMA  DISPOSITION OF SPECIMEN:  PATHOLOGY  COUNTS:  YES  TOURNIQUET:  * No tourniquets in log *  DICTATION: .Other Dictation: Dictation Number PENDING  PLAN OF CARE: Admit to inpatient   PATIENT DISPOSITION:  ICU - intubated and hemodynamically stable.   Delay start of Pharmacological VTE agent (>24hrs) due to surgical blood loss or risk of bleeding: yes

## 2015-11-10 NOTE — Progress Notes (Signed)
Dr. Roxan Hockey paged and made aware of pt's left sided weakness not improved upon pt waking up. Pt had left arm weakness and is unable to move left foot more than flicker. Known hx of Stroke previously. No orders received at this time. Abg results relayed with pc02 at 97. Order received to rest and try again shortly. Will continue to monitor closely. Eleonore Chiquito RN 2 [place]

## 2015-11-10 NOTE — Anesthesia Procedure Notes (Addendum)
Central Venous Catheter Insertion Performed by: anesthesiologist 11/10/2015 7:00 AM Patient location: Pre-op. Preanesthetic checklist: patient identified, IV checked, site marked, risks and benefits discussed, surgical consent, monitors and equipment checked, pre-op evaluation, timeout performed and anesthesia consent Lidocaine 1% used for infiltration Landmarks identified Catheter size: 8.5 Fr Sheath introducer Procedure performed using ultrasound guided technique. Attempts: 1 Following insertion, line sutured and dressing applied. Post procedure assessment: blood return through all ports, free fluid flow and no air. Patient tolerated the procedure well with no immediate complications.

## 2015-11-10 NOTE — Progress Notes (Signed)
Rapid wean started.  

## 2015-11-10 NOTE — Anesthesia Postprocedure Evaluation (Signed)
Anesthesia Post Note  Patient: Candace [name]  Procedure(s) Performed: Procedure(s) (LRB): CORONARY ARTERY BYPASS GRAFTING (CABG), ON PUMP, TIMES FOUR, USING LEFT INTERNAL MAMMARY ARTERY, BILATERAL GREATER SAPHENOUS VEINS HARVESTED ENDOSCOPICALLY (N/A) TRANSESOPHAGEAL ECHOCARDIOGRAM (TEE) (N/A)  Patient location during evaluation: SICU Anesthesia Type: General Level of consciousness: sedated Pain management: pain level controlled Vital Signs Assessment: post-procedure vital signs reviewed and stable Respiratory status: patient remains intubated per anesthesia plan Cardiovascular status: stable Anesthetic complications: no    Last Vitals:  Vitals:   11/10/15 1555 11/10/15 1700  BP: 132/75 119/78  Pulse: 94 89  Resp: 12 12  Temp:  36.4 C    Last Pain:  Vitals:   11/10/15 0341  TempSrc: Oral  PainSc:                  Candace [name]

## 2015-11-10 NOTE — Transfer of Care (Signed)
Immediate Anesthesia Transfer of Care Note  Patient: Candace [name]  Procedure(s) Performed: Procedure(s) with comments: CORONARY ARTERY BYPASS GRAFTING (CABG), ON PUMP, TIMES FOUR, USING LEFT INTERNAL MAMMARY ARTERY, BILATERAL GREATER SAPHENOUS VEINS HARVESTED ENDOSCOPICALLY (N/A) - LIMA-LAD; SEQ SVG-OM1-OM2; SVG-PL TRANSESOPHAGEAL ECHOCARDIOGRAM (TEE) (N/A)  Patient Location: ICU  Anesthesia Type:General  Level of Consciousness: Patient remains intubated per anesthesia plan  Airway & Oxygen Therapy: Patient remains intubated per anesthesia plan and Patient placed on Ventilator (see vital sign flow sheet for setting)  Post-op Assessment: Report given to RN  Post vital signs: Reviewed and stable  Last Vitals:  Vitals:   11/10/15 0804 11/10/15 0805  BP:    Pulse: 65 82  Resp: 12 18  Temp:      Last Pain:  Vitals:   11/10/15 0341  TempSrc: Oral  PainSc:       Patients Stated Pain Goal: 3 ([id number])  Complications: No apparent anesthesia complications

## 2015-11-10 NOTE — Progress Notes (Signed)
      [address]       [place] [address]             [phone]      S/p CABG x 4  Intubated and sedated  BP 119/78   Pulse 89   Temp 97.5 F (36.4 C)   Resp 12   Ht 5\' 3"  (1.6 m)   Wt 179 lb 0.2 oz (81.2 kg)   SpO2 99%   BMI 31.71 kg/m   CI= 1.65- up form 1.4 PA= 24/14   Intake/Output Summary (Last 24 hours) at 11/10/15 1807 Last data filed at 11/10/15 1700  Gross per 24 hour  Intake           3819.4 ml  Output             5830 ml  Net          -2010.6 ml    Doing well overall early postop.  Has had high UOP and have been chasing with volume Will give additional albumin. Supplement Ca 2+ due to albumin volume Hypokalemia- supplement K  Remo Lipps C. Roxan Hockey, MD [facility] [phone]

## 2015-11-10 NOTE — Progress Notes (Signed)
Attempted 40/4. Pt sleepy with poor effort. RR 5-10. Precedex only off since 7pm. Will continue to monitor closely. Eleonore Chiquito RN 2 [place]

## 2015-11-10 NOTE — Consult Note (Signed)
PULMONARY / CRITICAL CARE MEDICINE   Name: Candace [name] MRN: [id number] DOB: 15-Sep-1950    ADMISSION DATE:  11/07/2015 CONSULTATION DATE:  8/14  REFERRING MD:  Servando Snare   CHIEF COMPLAINT:  Chronic respiratory failure and COPD   HISTORY OF PRESENT ILLNESS:   This is a 65 year old female w/ known h/o CAD. Just transferred from [facility] after newly discovered severe 3V disease by cardiac cath. Pre-op Went for CABG 8/14.  Came back extubated.  Poor PFTs and anticipated will be difficult to wean to PCCM was consulted.  PAST MEDICAL HISTORY :  She  has a past medical history of Anxiety; Arrhythmia; Arthritis; Diabetes mellitus without complication ([place]); Hyperlipidemia; Hypertension; Reflux; and Stroke ([place]).  PAST SURGICAL HISTORY: She  has a past surgical history that includes Appendectomy; Cesarean section; Tubal ligation; and Cardiac catheterization (N/A, 11/06/2015).  Allergies  Allergen Reactions  . Penicillins Other (See Comments)    UNSPECIFIED     No current facility-administered medications on file prior to encounter.    Current Outpatient Prescriptions on File Prior to Encounter  Medication Sig  . aspirin-acetaminophen-caffeine (EXCEDRIN MIGRAINE) 250-250-65 MG per tablet Take 2 tablets by mouth every 6 (six) hours as needed for headache.  Marland Kitchen glipiZIDE (GLUCOTROL) 10 MG tablet Take 5-10 mg by mouth 2 (two) times daily. Pt takes one tablet in the morning and one-half tablet at bedtime.  Marland Kitchen linagliptin (TRADJENTA) 5 MG TABS tablet Take 5 mg by mouth daily.  Marland Kitchen lisinopril (PRINIVIL,ZESTRIL) 20 MG tablet Take 20 mg by mouth daily.  . metFORMIN (GLUCOPHAGE-XR) 500 MG 24 hr tablet Take 500 mg by mouth 2 (two) times daily.  . metoprolol (LOPRESSOR) 100 MG tablet Take 100 mg by mouth 2 (two) times daily.  . ondansetron (ZOFRAN) 4 MG tablet Take 4 mg by mouth every 8 (eight) hours as needed for nausea or vomiting.  . pantoprazole (PROTONIX) 40 MG tablet Take 40 mg by mouth daily.  .  rosuvastatin (CRESTOR) 20 MG tablet Take 20 mg by mouth daily.   . sertraline (ZOLOFT) 50 MG tablet Take 50 mg by mouth daily.   FAMILY HISTORY:  Her indicated that the status of her mother is unknown. She indicated that the status of her father is unknown.    SOCIAL HISTORY: She  reports that she quit smoking about 14 years ago. Her smoking use included Cigarettes. She has never used smokeless tobacco. She reports that she does not drink alcohol.  REVIEW OF SYSTEMS:   Unable to obtain, sedated and intubated.  SUBJECTIVE:  No events post op.  VITAL SIGNS: BP 119/78   Pulse 89   Temp 97.5 F (36.4 C)   Resp 12   Ht 5\' 3"  (1.6 m)   Wt 81.2 kg (179 lb 0.2 oz)   SpO2 99%   BMI 31.71 kg/m   HEMODYNAMICS: PAP: (24-43)/(8-29) 24/13 CO:  [2.6 L/min-2.8 L/min] 2.8 L/min CI:  [1.4 L/min/m2-1.5 L/min/m2] 1.5 L/min/m2  VENTILATOR SETTINGS: Vent Mode: SIMV;PSV;PRVC FiO2 (%):  [50 %] 50 % Set Rate:  [12 bmp] 12 bmp Vt Set:  [500 mL] 500 mL PEEP:  [5 cmH20] 5 cmH20 Pressure Support:  [10 cmH20] 10 cmH20 Plateau Pressure:  [17 cmH20] 17 cmH20  INTAKE / OUTPUT: I/O last 3 completed shifts: In: 87 [I.V.:690] Out: 2700 [Urine:2700]  PHYSICAL EXAMINATION: General:  Chronically ill appearing female, NAD, sedated and intubated. Neuro:  Sedate, not withdrawing to pain, remains paralyzed.  HEENT:  [place]/AT, PERRL, EOM-I and MMM. Cardiovascular:  RRR, Nl S1/S2, -M/R/G. Lungs:  CTA bilaterally. Abdomen:  Soft, NT, ND and +BS. Musculoskeletal:  -edema and -tenderness. Skin:  Intact.  LABS:  BMET  Recent Labs Lab 11/08/15 0554 11/09/15 0937 11/10/15 0321  11/10/15 1313 11/10/15 1327 11/10/15 1429  NA 141 138 140  < > 141 136 140  K 3.8 3.7 4.1  < > 3.5 3.5 3.6  CL 105 105 106  < > 100* 97* 101  CO2 28 28 26   --   --   --   --   BUN 9 6 <5*  < > <3* <3* <3*  CREATININE 0.64 0.64 0.63  < > 0.40* 0.40* 0.50  GLUCOSE 131* 93 89  < > 112* 118* 139*  < > = values in this  interval not displayed.  Electrolytes  Recent Labs Lab 11/08/15 0554 11/09/15 0937 11/10/15 0321  CALCIUM 8.8* 8.7* 9.3    CBC  Recent Labs Lab 11/08/15 0554 11/10/15 0321  11/10/15 1325 11/10/15 1327 11/10/15 1429 11/10/15 1610  WBC 8.1 7.4  --   --   --   --  8.7  HGB 11.3* 12.8  < > 6.9* 7.1* 8.5* 11.6*  HCT 34.7* 39.7  < > 20.6* 21.0* 25.0* 34.6*  PLT 198 176  --  90*  --   --  102*  < > = values in this interval not displayed.  Coag's  Recent Labs Lab 11/06/15 1728 11/07/15 2007 11/10/15 1610  APTT 33 84* 37*  INR 1.09 1.09 1.44    Sepsis Markers No results for input(s): LATICACIDVEN, PROCALCITON, O2SATVEN in the last 168 hours.  ABG  Recent Labs Lab 11/09/15 1409 11/10/15 0718 11/10/15 1212  PHART 7.350 7.343* 7.500*  PCO2ART 50.8* 54.3* 37.5  PO2ART 76.8* 75.0* 291.0*    Liver Enzymes  Recent Labs Lab 11/07/15 2007 11/09/15 0937  AST 16 21  ALT 11* 14  ALKPHOS 77 75  BILITOT 0.3 0.5  ALBUMIN 3.3* 3.3*    Cardiac Enzymes  Recent Labs Lab 11/07/15 2007 11/07/15 2257 11/08/15 0554  TROPONINI <0.03 <0.03 <0.03    Glucose  Recent Labs Lab 11/08/15 2126 11/09/15 0832 11/09/15 1229 11/09/15 1311 11/09/15 1558 11/09/15 2126  GLUCAP 99 102* 67 79 80 159*    Imaging Dg Chest Port 1 View  Result Date: 11/10/2015 CLINICAL DATA:  Status post CABG. EXAM: PORTABLE CHEST 1 VIEW COMPARISON:  11/05/2015. FINDINGS: Interval post CABG changes. Endotracheal tube in satisfactory position. Right jugular Swan-Ganz catheter tip in the proximal right main pulmonary artery. Nasogastric tube extending into the stomach. Mediastinal and left chest tubes without pneumothorax. Poor inspiration with mild bilateral linear atelectasis. Left shoulder degenerative changes. IMPRESSION: Mild bilateral atelectasis. Electronically Signed   By: Claudie Revering M.D.   On: 11/10/2015 16:43     STUDIES:    CULTURES: None  ANTIBIOTICS: Prophylaxis  only  SIGNIFICANT EVENTS: 8/14 CABG  LINES/TUBES: ETT 8/14>>> L radial a-line 8/14>>> R IJ PAC 8/14>>> Foley 8/14>>>  DISCUSSION: 65 year old female with extensive cardiopulmonary history presenting to PCCM for VDRF post CABG.  Patient is currently on SIMV and on the rapid extubation protocol.  Remains paralyzed at this time however.  ABG appears well.  PFT's noted, poor.  But I anticipate will be able to extubate quickly.  ASSESSMENT / PLAN:  PULMONARY A: Chronic respiratory failure  Underlying COPD P:   Maximize BD regiment. Rapid weaning protocol. If does not meet criteria for rapid weaning then please call eLink  for resting vent mode. CXR and ABG in AM if remains intubated.  CARDIOVASCULAR A:  3 V CAD now s/p CABG 8/14 Hyperlipidemia  Hypotension P:  Post-op per CVTS Minimize sedation, if unable may need pressors.  RENAL A:   No acute issues P:   Trend chemistry  Strict I&O Replace electrolytes as indicated.  GASTROINTESTINAL A:   No active issues P:   H2 blocker  If remains intubated in AM will need TF.  HEMATOLOGIC A:   Anemia of chronic disease  Post-op anemia  P:  Trend cbc DVT prophylaxis per surgical team   INFECTIOUS A:   No acute  P:   Trend fever and wbc curve   ENDOCRINE A:   Diabetes  P:   SSI  CBG  NEUROLOGIC A:   Prior CVA P:   RASS goal: 0 Minimize sedation for extubation.  FAMILY  - Updates: No family bedside.  - Inter-disciplinary family meet or Palliative Care meeting due by:  day 7  The patient is critically ill with multiple organ systems failure and requires high complexity decision making for assessment and support, frequent evaluation and titration of therapies, application of advanced monitoring technologies and extensive interpretation of multiple databases.   Critical Care Time devoted to patient care services described in this note is  35  Minutes. This time reflects time of care of this signee Dr Jennet Maduro. This critical care time does not reflect procedure time, or teaching time or supervisory time of PA/NP/Med student/Med Resident etc but could involve care discussion time.  Rush Farmer, M.D. [facility] Pulmonary/Critical Care Medicine. Pager: [phone]. After hours pager: [phone].  11/10/2015, 5:13 PM

## 2015-11-10 NOTE — OR Nursing (Signed)
Forty-five minute call to SICU charge nurse at 1455.

## 2015-11-10 NOTE — Progress Notes (Signed)
RT made aware of desire to start Rapid wean. Will continue to monitor closely.  Eleonore Chiquito RN 2 [place]

## 2015-11-10 NOTE — Anesthesia Procedure Notes (Signed)
Procedure Name: Intubation Date/Time: 11/10/2015 8:26 AM Performed by: Sampson Si E Pre-anesthesia Checklist: Patient identified, Emergency Drugs available, Suction available, Patient being monitored and Timeout performed Patient Re-evaluated:Patient Re-evaluated prior to inductionOxygen Delivery Method: Circle system utilized Preoxygenation: Pre-oxygenation with 100% oxygen Intubation Type: IV induction Ventilation: Mask ventilation without difficulty Laryngoscope Size: Mac and 3 Grade View: Grade I Tube type: Oral Tube size: 8.0 mm Number of attempts: 1 Airway Equipment and Method: Stylet Placement Confirmation: ETT inserted through vocal cords under direct vision and CO2 detector Secured at: 21 cm Tube secured with: Tape Dental Injury: Teeth and Oropharynx as per pre-operative assessment

## 2015-11-11 ENCOUNTER — Inpatient Hospital Stay (HOSPITAL_COMMUNITY): Payer: BLUE CROSS/BLUE SHIELD

## 2015-11-11 DIAGNOSIS — J449 Chronic obstructive pulmonary disease, unspecified: Secondary | ICD-10-CM

## 2015-11-11 DIAGNOSIS — J45909 Unspecified asthma, uncomplicated: Secondary | ICD-10-CM | POA: Diagnosis present

## 2015-11-11 LAB — GLUCOSE, CAPILLARY
Glucose-Capillary: 100 mg/dL — ABNORMAL HIGH (ref 65–99)
Glucose-Capillary: 103 mg/dL — ABNORMAL HIGH (ref 65–99)
Glucose-Capillary: 106 mg/dL — ABNORMAL HIGH (ref 65–99)
Glucose-Capillary: 108 mg/dL — ABNORMAL HIGH (ref 65–99)
Glucose-Capillary: 108 mg/dL — ABNORMAL HIGH (ref 65–99)
Glucose-Capillary: 110 mg/dL — ABNORMAL HIGH (ref 65–99)
Glucose-Capillary: 111 mg/dL — ABNORMAL HIGH (ref 65–99)
Glucose-Capillary: 113 mg/dL — ABNORMAL HIGH (ref 65–99)
Glucose-Capillary: 116 mg/dL — ABNORMAL HIGH (ref 65–99)
Glucose-Capillary: 119 mg/dL — ABNORMAL HIGH (ref 65–99)
Glucose-Capillary: 123 mg/dL — ABNORMAL HIGH (ref 65–99)
Glucose-Capillary: 124 mg/dL — ABNORMAL HIGH (ref 65–99)
Glucose-Capillary: 128 mg/dL — ABNORMAL HIGH (ref 65–99)
Glucose-Capillary: 133 mg/dL — ABNORMAL HIGH (ref 65–99)
Glucose-Capillary: 134 mg/dL — ABNORMAL HIGH (ref 65–99)
Glucose-Capillary: 143 mg/dL — ABNORMAL HIGH (ref 65–99)
Glucose-Capillary: 164 mg/dL — ABNORMAL HIGH (ref 65–99)
Glucose-Capillary: 185 mg/dL — ABNORMAL HIGH (ref 65–99)

## 2015-11-11 LAB — CBC
HCT: 27.1 % — ABNORMAL LOW (ref 36.0–46.0)
HCT: 26.5 % — ABNORMAL LOW (ref 36.0–46.0)
Hemoglobin: 9 g/dL — ABNORMAL LOW (ref 12.0–15.0)
Hemoglobin: 9 g/dL — ABNORMAL LOW (ref 12.0–15.0)
MCH: 30.4 pg (ref 26.0–34.0)
MCH: 31.7 pg (ref 26.0–34.0)
MCHC: 33.2 g/dL (ref 30.0–36.0)
MCHC: 34 g/dL (ref 30.0–36.0)
MCV: 91.6 fL (ref 78.0–100.0)
MCV: 93.3 fL (ref 78.0–100.0)
Platelets: 129 10*3/uL — ABNORMAL LOW (ref 150–400)
Platelets: 109 10*3/uL — ABNORMAL LOW (ref 150–400)
RBC: 2.96 MIL/uL — ABNORMAL LOW (ref 3.87–5.11)
RBC: 2.84 MIL/uL — ABNORMAL LOW (ref 3.87–5.11)
RDW: 13.1 % (ref 11.5–15.5)
RDW: 13.6 % (ref 11.5–15.5)
WBC: 9.4 10*3/uL (ref 4.0–10.5)
WBC: 10 10*3/uL (ref 4.0–10.5)

## 2015-11-11 LAB — POCT I-STAT, CHEM 8
BUN: 10 mg/dL (ref 6–20)
Calcium, Ion: 1.26 mmol/L — ABNORMAL HIGH (ref 1.12–1.23)
Chloride: 101 mmol/L (ref 101–111)
Creatinine, Ser: 0.9 mg/dL (ref 0.44–1.00)
Glucose, Bld: 141 mg/dL — ABNORMAL HIGH (ref 65–99)
HCT: 25 % — ABNORMAL LOW (ref 36.0–46.0)
Hemoglobin: 8.5 g/dL — ABNORMAL LOW (ref 12.0–15.0)
Potassium: 4.2 mmol/L (ref 3.5–5.1)
Sodium: 140 mmol/L (ref 135–145)
TCO2: 26 mmol/L (ref 0–100)

## 2015-11-11 LAB — BASIC METABOLIC PANEL
Anion gap: 7 (ref 5–15)
BUN: <5 mg/dL — ABNORMAL LOW (ref 6–20)
CO2: 25 mmol/L (ref 22–32)
Calcium: 8.5 mg/dL — ABNORMAL LOW (ref 8.9–10.3)
Chloride: 109 mmol/L (ref 101–111)
Creatinine, Ser: 0.68 mg/dL (ref 0.44–1.00)
GFR calc Af Amer: >60 mL/min (ref >60)
GFR calc non Af Amer: >60 mL/min (ref >60)
Glucose, Bld: 117 mg/dL — ABNORMAL HIGH (ref 65–99)
Potassium: 4.2 mmol/L (ref 3.5–5.1)
Sodium: 141 mmol/L (ref 135–145)

## 2015-11-11 LAB — POCT I-STAT 3, ART BLOOD GAS (G3+)
Acid-Base Excess: 1 mmol/L (ref 0.0–2.0)
Bicarbonate: 26.4 mEq/L — ABNORMAL HIGH (ref 20.0–24.0)
O2 Saturation: 95 %
Patient temperature: 37.3
TCO2: 28 mmol/L (ref 0–100)
pCO2 arterial: 48.3 mmHg — ABNORMAL HIGH (ref 35.0–45.0)
pH, Arterial: 7.348 — ABNORMAL LOW (ref 7.350–7.450)
pO2, Arterial: 80 mmHg (ref 80.0–100.0)

## 2015-11-11 LAB — MAGNESIUM
Magnesium: 1.9 mg/dL (ref 1.7–2.4)
Magnesium: 1.8 mg/dL (ref 1.7–2.4)

## 2015-11-11 LAB — CREATININE, SERUM
Creatinine, Ser: 0.97 mg/dL (ref 0.44–1.00)
GFR calc Af Amer: >60 mL/min (ref >60)
GFR calc non Af Amer: >60 mL/min (ref >60)

## 2015-11-11 MED ORDER — CHLORHEXIDINE GLUCONATE 0.12 % MT SOLN
15.0000 mL | Freq: Two times a day (BID) | OROMUCOSAL | Status: DC
  Administered 2015-11-11 (×3): 15 mL via OROMUCOSAL
  Filled 2015-11-11 (×2): qty 15

## 2015-11-11 MED ORDER — INSULIN ASPART 100 UNIT/ML ~~LOC~~ SOLN
0.0000 [IU] | SUBCUTANEOUS | Status: DC
  Administered 2015-11-11 – 2015-11-12 (×3): 2 [IU] via SUBCUTANEOUS

## 2015-11-11 MED ORDER — CETYLPYRIDINIUM CHLORIDE 0.05 % MT LIQD
7.0000 mL | Freq: Two times a day (BID) | OROMUCOSAL | Status: DC
  Administered 2015-11-11: 7 mL via OROMUCOSAL

## 2015-11-11 MED ORDER — LEVALBUTEROL HCL 0.63 MG/3ML IN NEBU
0.6300 mg | INHALATION_SOLUTION | Freq: Four times a day (QID) | RESPIRATORY_TRACT | Status: DC | PRN
Start: 2015-11-11 — End: 2015-11-18
  Administered 2015-11-12 – 2015-11-13 (×2): 0.63 mg via RESPIRATORY_TRACT
  Filled 2015-11-11 (×2): qty 3

## 2015-11-11 MED ORDER — FUROSEMIDE 10 MG/ML IJ SOLN
20.0000 mg | Freq: Once | INTRAMUSCULAR | Status: AC
  Administered 2015-11-11: 20 mg via INTRAVENOUS
  Filled 2015-11-11: qty 2

## 2015-11-11 MED ORDER — ENOXAPARIN SODIUM 40 MG/0.4ML ~~LOC~~ SOLN
40.0000 mg | Freq: Every day | SUBCUTANEOUS | Status: DC
  Administered 2015-11-11 – 2015-11-17 (×7): 40 mg via SUBCUTANEOUS
  Filled 2015-11-11 (×7): qty 0.4

## 2015-11-11 MED ORDER — FE FUMARATE-B12-VIT C-FA-IFC PO CAPS
1.0000 | ORAL_CAPSULE | Freq: Three times a day (TID) | ORAL | Status: DC
  Administered 2015-11-11 – 2015-11-18 (×20): 1 via ORAL
  Filled 2015-11-11 (×20): qty 1

## 2015-11-11 MED ORDER — INSULIN DETEMIR 100 UNIT/ML ~~LOC~~ SOLN
20.0000 [IU] | Freq: Every day | SUBCUTANEOUS | Status: DC
  Filled 2015-11-11: qty 0.2

## 2015-11-11 MED ORDER — INSULIN DETEMIR 100 UNIT/ML ~~LOC~~ SOLN
20.0000 [IU] | Freq: Once | SUBCUTANEOUS | Status: AC
  Administered 2015-11-11: 20 [IU] via SUBCUTANEOUS
  Filled 2015-11-11: qty 0.2

## 2015-11-11 MED FILL — Magnesium Sulfate Inj 50%: INTRAMUSCULAR | Qty: 10 | Status: AC

## 2015-11-11 MED FILL — Potassium Chloride Inj 2 mEq/ML: INTRAVENOUS | Qty: 40 | Status: AC

## 2015-11-11 MED FILL — Heparin Sodium (Porcine) Inj 1000 Unit/ML: INTRAMUSCULAR | Qty: 30 | Status: AC

## 2015-11-11 NOTE — Procedures (Signed)
Extubation Procedure Note  Patient Details:   Name: Candace [name] DOB: 03/08/1951 MRN: [id number]   Airway Documentation:     Evaluation  O2 sats: stable throughout Complications: No apparent complications Patient did tolerate procedure well. Bilateral Breath Sounds: Clear   Yes  Pt. had (+) cuff leak, FVC of >104L Winferd Humphrey 11/11/2015, 1:31 AM

## 2015-11-11 NOTE — Progress Notes (Signed)
PULMONARY / CRITICAL CARE MEDICINE   Name: Candace [name] MRN: [id number] DOB: 1950/05/17    ADMISSION DATE:  11/07/2015 CONSULTATION DATE:  8/14  REFERRING MD:  Servando Snare   CHIEF COMPLAINT:  Chronic respiratory failure and COPD   HISTORY OF PRESENT ILLNESS:   This is a 65 year old female w/ known h/o CAD. Just transferred from [facility] after newly discovered severe 3V disease by cardiac cath. Pre-op Went for CABG 8/14.  Came back extubated.  Poor PFTs and anticipated will be difficult to wean to PCCM was consulted.   SUBJECTIVE:  No chest pain, dyspnea Diuresing well On 2L O2 On neo gtt & insulin gtt   VITAL SIGNS: BP 99/65 (BP Location: Right Arm)   Pulse 92   Temp 99 F (37.2 C)   Resp 15   Ht 5\' 3"  (1.6 m)   Wt 86.6 kg (190 lb 14.7 oz)   SpO2 100%   BMI 33.82 kg/m   HEMODYNAMICS: PAP: (21-50)/(11-31) 47/28 CO:  [2.6 L/min-4.3 L/min] 4.2 L/min CI:  [1.4 L/min/m2-2.3 L/min/m2] 2.3 L/min/m2  VENTILATOR SETTINGS: Vent Mode: SIMV;PSV;PRVC FiO2 (%):  [30 %-50 %] 30 % Set Rate:  [4 bmp-12 bmp] 4 bmp Vt Set:  [500 mL-600 mL] 600 mL PEEP:  [5 cmH20] 5 cmH20 Pressure Support:  [10 cmH20] 10 cmH20 Plateau Pressure:  [17 cmH20-19 cmH20] 19 cmH20  INTAKE / OUTPUT: I/O last 3 completed shifts: In: 5404.8 [I.V.:3404.8; Blood:420; NG/GT:30; IV [id number] Out: [id number] [Urine:5360; Emesis/NG output:50; Blood:1625; Chest Tube:370]  PHYSICAL EXAMINATION: General: acutely ill appearing female Neuro:  Alert, interactive HEENT:  [place]/AT, PERRL, EOM-I and MMM. Cardiovascular:  RRR, Nl S1/S2, -M/R/G. Lungs:  CTA bilaterally. Abdomen:  Soft, NT, ND and +BS. Musculoskeletal:  -edema and -tenderness. Skin:  Intact.  LABS:  BMET  Recent Labs Lab 11/09/15 0937 11/10/15 0321  11/10/15 1429 11/10/15 1610 11/10/15 2140 11/11/15 0415  NA 138 140  < > 140 144 142 141  K 3.7 4.1  < > 3.6 3.4* 5.3* 4.2  CL 105 106  < > 101  --  106 109  CO2 28 26  --   --   --   --  25  BUN 6  <5*  < > <3*  --  <3* <5*  CREATININE 0.64 0.63  < > 0.50  --  0.63  0.50 0.68  GLUCOSE 93 89  < > 139* 145* 107* 117*  < > = values in this interval not displayed.  Electrolytes  Recent Labs Lab 11/09/15 0937 11/10/15 0321 11/10/15 2140 11/11/15 0415  CALCIUM 8.7* 9.3  --  8.5*  MG  --   --  2.6* 1.9    CBC  Recent Labs Lab 11/10/15 1610 11/10/15 2140 11/11/15 0415  WBC 8.7 9.2 9.4  HGB 11.6*  11.2* 10.0*  9.5* 9.0*  HCT 34.6*  33.0* 29.0*  28.0* 27.1*  PLT 102* 117* 129*    Coag's  Recent Labs Lab 11/06/15 1728 11/07/15 2007 11/10/15 1610  APTT 33 84* 37*  INR 1.09 1.09 1.44    Sepsis Markers No results for input(s): LATICACIDVEN, PROCALCITON, O2SATVEN in the last 168 hours.  ABG  Recent Labs Lab 11/10/15 2211 11/10/15 2345 11/11/15 0102  PHART 7.335* 7.434 7.348*  PCO2ART 51.8* 37.2 48.3*  PO2ART 428.0* 66.0* 80.0    Liver Enzymes  Recent Labs Lab 11/07/15 2007 11/09/15 0937  AST 16 21  ALT 11* 14  ALKPHOS 77 75  BILITOT 0.3 0.5  ALBUMIN  3.3* 3.3*    Cardiac Enzymes  Recent Labs Lab 11/07/15 2007 11/07/15 2257 11/08/15 0554  TROPONINI <0.03 <0.03 <0.03    Glucose  Recent Labs Lab 11/11/15 0315 11/11/15 0414 11/11/15 0516 11/11/15 0625 11/11/15 0717 11/11/15 0826  GLUCAP 123* 128* 116* 113* 106* 111*    Imaging Dg Chest Port 1 View  Result Date: 11/11/2015 CLINICAL DATA:  CABG. EXAM: PORTABLE CHEST 1 VIEW COMPARISON:  11/10/2015. FINDINGS: Interim extubation removal of NG tube. Swan-Ganz catheter left chest tube in stable position. Prior CABG. Cardiomegaly with normal pulmonary vascularity. Low lung volumes with mild bibasilar atelectasis. No pleural effusion or pneumothorax . IMPRESSION: 1. Interim extubation and removal of NG tube.Swan-Ganz catheter and left chest tube in stable position. No pneumothorax. 2.   Prior CABG.  Stable cardiomegaly. 3.   Low lung volumes with mild bibasilar atelectasis . Electronically  Signed   By: Marcello Moores  Register   On: 11/11/2015 07:23   Dg Chest Port 1 View  Result Date: 11/10/2015 CLINICAL DATA:  Status post CABG. EXAM: PORTABLE CHEST 1 VIEW COMPARISON:  11/05/2015. FINDINGS: Interval post CABG changes. Endotracheal tube in satisfactory position. Right jugular Swan-Ganz catheter tip in the proximal right main pulmonary artery. Nasogastric tube extending into the stomach. Mediastinal and left chest tubes without pneumothorax. Poor inspiration with mild bilateral linear atelectasis. Left shoulder degenerative changes. IMPRESSION: Mild bilateral atelectasis. Electronically Signed   By: Claudie Revering M.D.   On: 11/10/2015 16:43     STUDIES:    CULTURES: None  ANTIBIOTICS: Prophylaxis only  SIGNIFICANT EVENTS: 8/14 CABG  LINES/TUBES: ETT 8/14>>>8/14 L radial a-line 8/14>>> R IJ PAC 8/14>>> Foley 8/14>>>  DISCUSSION: 65 year old female with extensive cardiopulmonary history presenting to PCCM for VDRF post CABG -extubated  PFT's noted, - mod restriction, ratio 76 , remote smoker  ASSESSMENT / PLAN:  PULMONARY A: Chronic respiratory failure  Underlying COPD P:  duonebs q6h prn    CARDIOVASCULAR A:  3 V CAD now s/p CABG 8/14 Hyperlipidemia  Hypotension P:  Post-op per CVTS   ENDOCRINE A:   Diabetes  P:   SSI  CBG - lantus & come off insulin gtt now that sugars ok  PCCm available as needed, can follow as outpt  Kara Mead MD. FCCP. [place] [facility] Pager [phone] If no response call [phone]   11/11/2015     11/11/2015, 9:42 AM

## 2015-11-11 NOTE — Progress Notes (Signed)
Pts UOP has been marginal for past three hours, 25cc, 30cc, then 20cc at 2000. Dr. Prescott Gum updated. Last BP 109/57 and pt on 2L . Order for one time dose of 20mg  IV Lasix to be given. Will continue to monitor.

## 2015-11-11 NOTE — Progress Notes (Signed)
CT surgery p.m. Rounds  Patient doing well after multivessel CABG yesterday Currently sitting up in chair, comfortable Sinus rhythm with stable vital signs P.m. labs reviewed and are satisfactory Hemoglobin decreased to 8.5 -Trinsicon ordered

## 2015-11-11 NOTE — Progress Notes (Addendum)
[facility] DAILY ICU PROGRESS NOTE                   [address]            [place] [address]          [phone]   1 Day Post-Op Procedure(s) (LRB): CORONARY ARTERY BYPASS GRAFTING (CABG), ON PUMP, TIMES FOUR, USING LEFT INTERNAL MAMMARY ARTERY, BILATERAL GREATER SAPHENOUS VEINS HARVESTED ENDOSCOPICALLY (N/A) TRANSESOPHAGEAL ECHOCARDIOGRAM (TEE) (N/A)  Total Length of Stay:  LOS: 4 days   Subjective:  [name] states she is doing okay.  She does complain of nausea but has not vomited.  She states her pain is better.  Objective: Vital signs in last 24 hours: Temp:  [97.3 F (36.3 C)-99.3 F (37.4 C)] 99.1 F (37.3 C) (08/15 0700) Pulse Rate:  [80-95] 95 (08/15 0700) Cardiac Rhythm: Atrial paced (08/15 0430) Resp:  [10-27] 26 (08/15 0700) BP: (85-140)/(56-81) 94/62 (08/15 0700) SpO2:  [92 %-100 %] 100 % (08/15 0802) Arterial Line BP: (86-161)/(45-78) 105/55 (08/15 0700) FiO2 (%):  [30 %-50 %] 30 % (08/14 2215) Weight:  [190 lb 14.7 oz (86.6 kg)] 190 lb 14.7 oz (86.6 kg) (08/15 0500)  Filed Weights   11/09/15 0900 11/10/15 0500 11/11/15 0500  Weight: 179 lb 0.2 oz (81.2 kg) 179 lb 0.2 oz (81.2 kg) 190 lb 14.7 oz (86.6 kg)    Weight change: 11 lb 14.5 oz (5.4 kg)   Hemodynamic parameters for last 24 hours: PAP: (21-50)/(11-31) 45/25 CO:  [2.6 L/min-4.3 L/min] 3.7 L/min CI:  [1.4 L/min/m2-2.3 L/min/m2] 2 L/min/m2  Intake/Output from previous day: 08/14 0701 - 08/15 0700 In: 5209.8 [I.V.:3209.8; Blood:420; NG/GT:30; IV [id number] Out: [id number] [Urine:4460; Emesis/NG output:50; Blood:1625; Chest Tube:370]  Intake/Output this shift: No intake/output data recorded.  Current Meds: Scheduled Meds: . acetaminophen  1,000 mg Oral Q6H   Or  . acetaminophen (TYLENOL) oral liquid 160 mg/5 mL  1,000 mg Per Tube Q6H  . antiseptic oral rinse  7 mL Mouth Rinse q12n4p  . aspirin EC  325 mg Oral Daily   Or  . aspirin  324 mg Per Tube Daily  . bisacodyl  10  mg Oral Daily   Or  . bisacodyl  10 mg Rectal Daily  . chlorhexidine  15 mL Mouth Rinse BID  . docusate sodium  200 mg Oral Daily  . enoxaparin (LOVENOX) injection  40 mg Subcutaneous QHS  . famotidine (PEPCID) IV  20 mg Intravenous Q12H  . insulin aspart  0-24 Units Subcutaneous Q4H  . insulin detemir  20 Units Subcutaneous Once  . [START ON 11/12/2015] insulin detemir  20 Units Subcutaneous Daily  . insulin regular  0-10 Units Intravenous TID WC  . levalbuterol  0.63 mg Nebulization Q6H  . levofloxacin (LEVAQUIN) IV  750 mg Intravenous Q24H  . metoprolol tartrate  12.5 mg Oral BID   Or  . metoprolol tartrate  12.5 mg Per Tube BID  . [START ON 11/12/2015] pantoprazole  40 mg Oral Daily  . rosuvastatin  40 mg Oral q1800  . sertraline  50 mg Oral Daily  . sodium chloride flush  3 mL Intravenous Q12H   Continuous Infusions: . sodium chloride 20 mL/hr at 11/11/15 0600  . sodium chloride    . sodium chloride    . dexmedetomidine Stopped (11/10/15 1906)  . insulin (NOVOLIN-R) infusion 2.1 Units/hr (11/11/15 0626)  . lactated ringers 20 mL/hr at 11/11/15 0600  . lactated ringers    .  nitroGLYCERIN Stopped (11/11/15 0315)  . phenylephrine (NEO-SYNEPHRINE) Adult infusion 20 mcg/min (11/11/15 0600)   PRN Meds:.sodium chloride, albumin human, lactated ringers, metoprolol, midazolam, morphine injection, ondansetron (ZOFRAN) IV, oxyCODONE, sodium chloride flush, traMADol  General appearance: alert, cooperative and no distress Heart: regular rate and rhythm Lungs: clear to auscultation bilaterally Abdomen: soft, non-tender; bowel sounds normal; no masses,  no organomegaly Extremities: edema trace, and RLE is cool to touch, bruising noted at [facility] sites, no pulses appreciated Wound: Aquacel on sternotomy, EVH site Right leg, some oozing present  Lab Results: CBC: Recent Labs  11/10/15 2140 11/11/15 0415  WBC 9.2 9.4  HGB 10.0*  9.5* 9.0*  HCT 29.0*  28.0* 27.1*  PLT 117* 129*    BMET:  Recent Labs  11/10/15 0321  11/10/15 2140 11/11/15 0415  NA 140  < > 142 141  K 4.1  < > 5.3* 4.2  CL 106  < > 106 109  CO2 26  --   --  25  GLUCOSE 89  < > 107* 117*  BUN <5*  < > <3* <5*  CREATININE 0.63  < > 0.63  0.50 0.68  CALCIUM 9.3  --   --  8.5*  < > = values in this interval not displayed.  CMET: Lab Results  Component Value Date   WBC 9.4 11/11/2015   HGB 9.0 (L) 11/11/2015   HCT 27.1 (L) 11/11/2015   PLT 129 (L) 11/11/2015   GLUCOSE 117 (H) 11/11/2015   CHOL 214 (H) 07/14/2013   TRIG 118 07/14/2013   HDL 36 (L) 07/14/2013   LDLCALC 154 (H) 07/14/2013   ALT 14 11/09/2015   AST 21 11/09/2015   NA 141 11/11/2015   K 4.2 11/11/2015   CL 109 11/11/2015   CREATININE 0.68 11/11/2015   BUN <5 (L) 11/11/2015   CO2 25 11/11/2015   TSH 0.763 01/23/2012   INR 1.44 11/10/2015   HGBA1C 8.2 (H) 11/09/2015    PT/INR:  Recent Labs  11/10/15 1610  LABPROT 17.7*  INR 1.44   Radiology: Dg Chest Port 1 View  Result Date: 11/11/2015 CLINICAL DATA:  CABG. EXAM: PORTABLE CHEST 1 VIEW COMPARISON:  11/10/2015. FINDINGS: Interim extubation removal of NG tube. Swan-Ganz catheter left chest tube in stable position. Prior CABG. Cardiomegaly with normal pulmonary vascularity. Low lung volumes with mild bibasilar atelectasis. No pleural effusion or pneumothorax . IMPRESSION: 1. Interim extubation and removal of NG tube.Swan-Ganz catheter and left chest tube in stable position. No pneumothorax. 2.   Prior CABG.  Stable cardiomegaly. 3.   Low lung volumes with mild bibasilar atelectasis . Electronically Signed   By: Marcello Moores  Register   On: 11/11/2015 07:23   Dg Chest Port 1 View  Result Date: 11/10/2015 CLINICAL DATA:  Status post CABG. EXAM: PORTABLE CHEST 1 VIEW COMPARISON:  11/05/2015. FINDINGS: Interval post CABG changes. Endotracheal tube in satisfactory position. Right jugular Swan-Ganz catheter tip in the proximal right main pulmonary artery. Nasogastric tube  extending into the stomach. Mediastinal and left chest tubes without pneumothorax. Poor inspiration with mild bilateral linear atelectasis. Left shoulder degenerative changes. IMPRESSION: Mild bilateral atelectasis. Electronically Signed   By: Claudie Revering M.D.   On: 11/10/2015 16:43     Assessment/Plan: S/P Procedure(s) (LRB): CORONARY ARTERY BYPASS GRAFTING (CABG), ON PUMP, TIMES FOUR, USING LEFT INTERNAL MAMMARY ARTERY, BILATERAL GREATER SAPHENOUS VEINS HARVESTED ENDOSCOPICALLY (N/A) TRANSESOPHAGEAL ECHOCARDIOGRAM (TEE) (N/A)  1. CV- NSR, remains on NEO- wean as tolerated, BB ordered 2. Pulm- wean oxygen  as tolerated, continue aggressive pulm toilet.. CXR with atelectasis, no significant effusions 3. Renal- creatinine WNL, mild hypervolemia... Hold on Lasix until off NEO 4. GI- post op nausea, zofran prn 5. Expected post operative blood loss anemia, mild Hgb at 9.0 will monitor 6. DM- wean insulin drip as tolerated, Levemir ordered 7. Dispo- patient stable, zofran for nausea, wean Neo as tolerated, POD #1 progression     BARRETT, ERIN 11/11/2015 8:14 AM    Stable, extubated without difficulty even with FEV1 800 and prop pco2 54  I have seen and examined Nyana W Fitchett and agree with the above assessment  and plan.  [name] MD [place] [phone] Office [phone] 11/11/2015 1:53 PM

## 2015-11-11 NOTE — Op Note (Signed)
NAME:  Candace, [name] NO.:  [id number]  MEDICAL RECORD NO.:  [id number]  LOCATION:                               FACILITY:  [facility]  PHYSICIAN:  Lanelle Bal, MD    DATE OF BIRTH:  05-01-1950  DATE OF PROCEDURE:  11/10/2015 DATE OF DISCHARGE:                              OPERATIVE REPORT   PREOPERATIVE DIAGNOSIS:  Coronary occlusive disease with unstable angina.  POSTOPERATIVE DIAGNOSIS:  Coronary occlusive disease with unstable angina.  SURGICAL PROCEDURE:  Coronary artery bypass grafting x4 with the left internal mammary to the left anterior descending coronary artery, sequential reverse saphenous vein graft to the first and second obtuse marginal, reverse saphenous vein graft to the continuation branch of the right coronary artery with bilateral right and left greater saphenous vein harvesting.  SURGEON:  Lanelle Bal, MD.  FIRST ASSISTANT:  John Giovanni, P.A.-C. and Johann Capers PA.  BRIEF HISTORY:  The patient is a 65 year old diabetic female with underlying pulmonary disease.  The patient presented from [facility] with unstable anginal symptoms where she had been admitted after episodes of prolonged chest pain.  Cardiac catheterization was performed demonstrating continued progression of coronary occlusive disease which was evident in 2011 and 2014 on previous cardiac catheterizations, but because of the patient's unstable symptoms, coronary artery bypass grafting was recommended to the patient who agreed and signed informed consent.  The risks, benefits, and options were reviewed in detail.  DESCRIPTION OF PROCEDURE:  The patient with Swan-Ganz arterial line monitors in place underwent general endotracheal anesthesia without incident.  Skin of the chest and legs were prepped with Betadine and draped in usual sterile manner.  An incision was made around the right knee.  The vein at the knee was relatively small.  The right greater saphenous  vein was harvested endoscopically, and at least thigh portion of the vein was of suitable quality and caliber.  Needing additional vein, we then moved to the left leg, and in a similar fashion, the left greater saphenous vein was harvested endoscopically.  A median sternotomy was performed.  Left internal mammary artery was dissected down as a pedicle graft.  The distal artery was divided and had good free flow though the mammary artery was a small vessel.  The patient's pericardium was opened, and overall, ventricular function appeared preserved.  This was confirmed on TEE.  The patient was systemically heparinized.  The ascending aorta was cannulated.  The right atrium was cannulated and aortic root vent cardioplegia needle was introduced into the ascending aorta.  The patient was placed on cardiopulmonary bypass 2.4 L/min/m2.  Sites of anastomosis were selected and dissected out of epicardium.  Aortic crossclamp was applied and 500 mL of cold blood potassium cardioplegia was administered with diastolic arrest of the heart.  Myocardial septal temperature was monitored throughout the crossclamp.  Attention was turned first to the right coronary system.  The posterior descending coronary artery was a very small vessel.  The right coronary artery had a 50%-60% lesion in the distal right coronary artery, just before the takeoff of the posterior descending.  The posterior descending coronary artery was too small to bypass.  We opened the  distal right coronary artery just past the takeoff of the posterior descending coronary artery.  The vessel was of reasonable size.  At this point, a 1.5 mm probe passed distally.  Using a running 7-0 Prolene, distal anastomosis was performed.  We then turned our attention to the obtuse marginal vessels.  Both were diffusely diseased and relatively small and thin walled.  The first OM was opened, admitted a 1-mm probe.  Using a diamond-type  side-to-side anastomosis with a running 8-0 Prolene, the distal anastomosis was performed.  The distal extent of same vein was then carried short distance to the smaller second obtuse marginal vessel in a similar fashion.  The vessel was opened and did admit a 1-mm probe.  Using a running 8-0 Prolene, distal anastomosis was performed.  Between the mid and distal third, the left anterior descending coronary artery was opened and was a relatively good quality vessel.  At this point, a 1-mm probe passed distally.  Using a running 8-0 Prolene, left internal mammary artery was anastomosed to the left anterior descending coronary artery.  With the cross-clamp still in place, 2 punch aortotomies were performed, and each of the two vein grafts were anastomosed to the ascending aorta. The bulldog was removed from the mammary artery with prompt rise in myocardial septal temperature.  The heart was allowed to be de-aired through the proximal vein grafts.  Aortic crossclamp was removed.  The proximal anastomoses were completed.  The patient spontaneously converted to a sinus rhythm.  She did require atrially paced to increase her rate.  Sites of anastomosis were inspected and were free of bleeding.  Total crossclamp time was 95 minutes.  The patient was rewarmed to 37 degrees.  She was then ventilated and weaned from cardiopulmonary bypass without difficulty.  She remained hemodynamically stable, was decannulated in usual fashion.  Protamine sulfate was administered with operative field hemostatic.  Atrial and ventricular pacing wires had been applied.  Graft markers were applied.  The left pleural tube and a Blake mediastinal drain were left in place.  The sternum was closed with #6 stainless steel wire.  Fascia closed with interrupted 0 Vicryl, running 3-0 Vicryl in the subcutaneous tissue, and 4-0 subcuticular stitch in the skin edges.  Dry dressings were applied. Sponge and needle count was  reported as correct at completion of procedure.  The patient tolerated the procedure without obvious complication and was transferred to the Surgical Intensive Care Unit for further postoperative care.  Total pump time was 133 minutes.  The patient did require packed red blood cells for a low hematocrit while on bypass and with preoperative anemia.     Lanelle Bal, MD   ______________________________ Lanelle Bal, MD    EG/MEDQ  D:  11/11/2015  T:  11/11/2015  Job:  [id number]  cc:   Serafina Royals, MD

## 2015-11-12 ENCOUNTER — Inpatient Hospital Stay (HOSPITAL_COMMUNITY): Payer: BLUE CROSS/BLUE SHIELD

## 2015-11-12 LAB — BASIC METABOLIC PANEL
Anion gap: 3 — ABNORMAL LOW (ref 5–15)
BUN: 12 mg/dL (ref 6–20)
CO2: 26 mmol/L (ref 22–32)
Calcium: 8.3 mg/dL — ABNORMAL LOW (ref 8.9–10.3)
Chloride: 107 mmol/L (ref 101–111)
Creatinine, Ser: 0.99 mg/dL (ref 0.44–1.00)
GFR calc Af Amer: >60 mL/min (ref >60)
GFR calc non Af Amer: 59 mL/min — ABNORMAL LOW (ref >60)
Glucose, Bld: 151 mg/dL — ABNORMAL HIGH (ref 65–99)
Potassium: 4 mmol/L (ref 3.5–5.1)
Sodium: 136 mmol/L (ref 135–145)

## 2015-11-12 LAB — GLUCOSE, CAPILLARY
Glucose-Capillary: 136 mg/dL — ABNORMAL HIGH (ref 65–99)
Glucose-Capillary: 131 mg/dL — ABNORMAL HIGH (ref 65–99)
Glucose-Capillary: 128 mg/dL — ABNORMAL HIGH (ref 65–99)
Glucose-Capillary: 179 mg/dL — ABNORMAL HIGH (ref 65–99)
Glucose-Capillary: 157 mg/dL — ABNORMAL HIGH (ref 65–99)
Glucose-Capillary: 135 mg/dL — ABNORMAL HIGH (ref 65–99)

## 2015-11-12 MED ORDER — DOCUSATE SODIUM 100 MG PO CAPS
200.0000 mg | ORAL_CAPSULE | Freq: Every day | ORAL | Status: DC
  Administered 2015-11-12 – 2015-11-18 (×5): 200 mg via ORAL
  Filled 2015-11-12 (×5): qty 2

## 2015-11-12 MED ORDER — INSULIN ASPART 100 UNIT/ML ~~LOC~~ SOLN
0.0000 [IU] | Freq: Three times a day (TID) | SUBCUTANEOUS | Status: DC
  Administered 2015-11-12 (×2): 2 [IU] via SUBCUTANEOUS
  Administered 2015-11-12: 4 [IU] via SUBCUTANEOUS
  Administered 2015-11-13 (×2): 2 [IU] via SUBCUTANEOUS
  Administered 2015-11-14: 4 [IU] via SUBCUTANEOUS
  Administered 2015-11-14: 8 [IU] via SUBCUTANEOUS
  Administered 2015-11-15: 2 [IU] via SUBCUTANEOUS
  Administered 2015-11-15: 4 [IU] via SUBCUTANEOUS
  Administered 2015-11-16: 2 [IU] via SUBCUTANEOUS
  Administered 2015-11-16 – 2015-11-17 (×3): 8 [IU] via SUBCUTANEOUS

## 2015-11-12 MED ORDER — METOPROLOL TARTRATE 12.5 MG HALF TABLET
12.5000 mg | ORAL_TABLET | Freq: Two times a day (BID) | ORAL | Status: DC
  Administered 2015-11-12 (×2): 12.5 mg via ORAL
  Filled 2015-11-12 (×2): qty 1

## 2015-11-12 MED ORDER — FUROSEMIDE 10 MG/ML IJ SOLN
INTRAMUSCULAR | Status: AC
Start: 2015-11-12 — End: 2015-11-13
  Filled 2015-11-12: qty 4

## 2015-11-12 MED ORDER — GLIPIZIDE 10 MG PO TABS
10.0000 mg | ORAL_TABLET | Freq: Every day | ORAL | Status: DC
  Administered 2015-11-12 – 2015-11-17 (×6): 10 mg via ORAL
  Filled 2015-11-12 (×3): qty 1
  Filled 2015-11-12: qty 2
  Filled 2015-11-12 (×2): qty 1

## 2015-11-12 MED ORDER — ONDANSETRON HCL 4 MG PO TABS
4.0000 mg | ORAL_TABLET | Freq: Four times a day (QID) | ORAL | Status: DC | PRN
  Filled 2015-11-12: qty 1

## 2015-11-12 MED ORDER — MOVING RIGHT ALONG BOOK
Freq: Once | Status: AC
Start: 2015-11-12 — End: 2015-11-12
  Administered 2015-11-12: 08:00:00
  Filled 2015-11-12: qty 1

## 2015-11-12 MED ORDER — OXYCODONE HCL 5 MG PO TABS
5.0000 mg | ORAL_TABLET | ORAL | Status: DC | PRN
  Administered 2015-11-12 – 2015-11-18 (×21): 5 mg via ORAL
  Filled 2015-11-12 (×24): qty 1

## 2015-11-12 MED ORDER — FUROSEMIDE 10 MG/ML IJ SOLN
40.0000 mg | Freq: Once | INTRAMUSCULAR | Status: AC
  Administered 2015-11-12: 40 mg via INTRAVENOUS

## 2015-11-12 MED ORDER — BISACODYL 10 MG RE SUPP: 10.0000 mg | Freq: Every day | RECTAL | Status: DC | PRN

## 2015-11-12 MED ORDER — LINAGLIPTIN 5 MG PO TABS
5.0000 mg | ORAL_TABLET | Freq: Every day | ORAL | Status: DC
  Administered 2015-11-12 – 2015-11-18 (×7): 5 mg via ORAL
  Filled 2015-11-12 (×7): qty 1

## 2015-11-12 MED ORDER — TRAMADOL HCL 50 MG PO TABS
50.0000 mg | ORAL_TABLET | ORAL | Status: DC | PRN
Start: 2015-11-12 — End: 2015-11-18
  Administered 2015-11-12 – 2015-11-16 (×11): 50 mg via ORAL
  Filled 2015-11-12 (×12): qty 1

## 2015-11-12 MED ORDER — BISACODYL 5 MG PO TBEC: 10.0000 mg | DELAYED_RELEASE_TABLET | Freq: Every day | ORAL | Status: DC | PRN

## 2015-11-12 MED ORDER — GLIPIZIDE 5 MG PO TABS
5.0000 mg | ORAL_TABLET | Freq: Every day | ORAL | Status: DC
  Administered 2015-11-12 – 2015-11-17 (×6): 5 mg via ORAL
  Filled 2015-11-12 (×6): qty 1

## 2015-11-12 MED ORDER — GUAIFENESIN ER 600 MG PO TB12: 600.0000 mg | ORAL_TABLET | Freq: Two times a day (BID) | ORAL | Status: DC | PRN

## 2015-11-12 MED ORDER — METFORMIN HCL ER 500 MG PO TB24
500.0000 mg | ORAL_TABLET | Freq: Two times a day (BID) | ORAL | Status: DC
  Administered 2015-11-12 – 2015-11-15 (×7): 500 mg via ORAL
  Filled 2015-11-12 (×7): qty 1

## 2015-11-12 MED ORDER — SODIUM CHLORIDE 0.9 % IV SOLN: 250.0000 mL | INTRAVENOUS | Status: DC | PRN

## 2015-11-12 MED ORDER — ASPIRIN EC 325 MG PO TBEC
325.0000 mg | DELAYED_RELEASE_TABLET | Freq: Every day | ORAL | Status: DC
  Administered 2015-11-12 – 2015-11-18 (×7): 325 mg via ORAL
  Filled 2015-11-12 (×7): qty 1

## 2015-11-12 MED ORDER — SODIUM CHLORIDE 0.9% FLUSH: 3.0000 mL | INTRAVENOUS | Status: DC | PRN

## 2015-11-12 MED ORDER — PANTOPRAZOLE SODIUM 40 MG PO TBEC
40.0000 mg | DELAYED_RELEASE_TABLET | Freq: Every day | ORAL | Status: DC
  Administered 2015-11-12 – 2015-11-18 (×7): 40 mg via ORAL
  Filled 2015-11-12 (×7): qty 1

## 2015-11-12 MED ORDER — ONDANSETRON HCL 4 MG/2ML IJ SOLN
4.0000 mg | Freq: Four times a day (QID) | INTRAMUSCULAR | Status: DC | PRN
  Administered 2015-11-15 – 2015-11-18 (×4): 4 mg via INTRAVENOUS
  Filled 2015-11-12 (×5): qty 2

## 2015-11-12 MED ORDER — SODIUM CHLORIDE 0.9% FLUSH
3.0000 mL | Freq: Two times a day (BID) | INTRAVENOUS | Status: DC
  Administered 2015-11-12 – 2015-11-17 (×12): 3 mL via INTRAVENOUS

## 2015-11-12 MED ORDER — FUROSEMIDE 40 MG PO TABS
40.0000 mg | ORAL_TABLET | Freq: Every day | ORAL | Status: DC
  Administered 2015-11-12: 40 mg via ORAL
  Filled 2015-11-12: qty 1

## 2015-11-12 MED FILL — Sodium Chloride IV Soln 0.9%: INTRAVENOUS | Qty: 2000 | Status: AC

## 2015-11-12 MED FILL — Albumin, Human Inj 5%: INTRAVENOUS | Qty: 250 | Status: AC

## 2015-11-12 MED FILL — Sodium Bicarbonate IV Soln 8.4%: INTRAVENOUS | Qty: 50 | Status: AC

## 2015-11-12 MED FILL — Heparin Sodium (Porcine) Inj 1000 Unit/ML: INTRAMUSCULAR | Qty: 10 | Status: AC

## 2015-11-12 MED FILL — Electrolyte-R (PH 7.4) Solution: INTRAVENOUS | Qty: 4000 | Status: AC

## 2015-11-12 MED FILL — Mannitol IV Soln 20%: INTRAVENOUS | Qty: 500 | Status: AC

## 2015-11-12 MED FILL — Lidocaine HCl IV Inj 20 MG/ML: INTRAVENOUS | Qty: 5 | Status: AC

## 2015-11-12 NOTE — Progress Notes (Signed)
Transferred to 2W22 via WC, O2 and monitor. Placed in chair, O2 resumed at 2l/min, NT in room to receive

## 2015-11-12 NOTE — Progress Notes (Signed)
CARDIAC REHAB PHASE I   PRE:  Rate/Rhythm: 102 ST  BP:  Supine: 130/66  Sitting:   Standing:    SaO2: 93% 2L  MODE:  Ambulation: 40 ft   POST:  Rate/Rhythm: 119 ST  BP:  Supine:   Sitting: 126/65  Standing:    SaO2: 96-97% 2L 1350-1425 Pt c/o left leg numb and heavy during walk and c/o a lot of pain (7) on pain scale. Also slow to answer and stated felt a little foggy. Pt walked 40 ft on 2L with gait belt use, rollator and asst x 2. Had to sit at 20 ft to rest and then when we got back to room. Insisted that we put her in bed as she did not want recliner. Notified RN of pain and left leg heavy. Keep as asst x2 as very unsteady.   Graylon Good, RN BSN  11/12/2015 2:22 PM

## 2015-11-12 NOTE — Progress Notes (Signed)
Attempted to call report to 2W 

## 2015-11-12 NOTE — Progress Notes (Signed)
Patient ID: Candace [name], female   DOB: 1950/05/20, 65 y.o.   MRN: [id number] TCTS DAILY ICU PROGRESS NOTE                   [address]            [place] [address]          [phone]   2 Days Post-Op Procedure(s) (LRB): CORONARY ARTERY BYPASS GRAFTING (CABG), ON PUMP, TIMES FOUR, USING LEFT INTERNAL MAMMARY ARTERY, BILATERAL GREATER SAPHENOUS VEINS HARVESTED ENDOSCOPICALLY (N/A) TRANSESOPHAGEAL ECHOCARDIOGRAM (TEE) (N/A)  Total Length of Stay:  LOS: 5 days   Subjective: Alert awake, neuro intact   Objective: Vital signs in last 24 hours: Temp:  [98.4 F (36.9 C)-99.1 F (37.3 C)] 98.9 F (37.2 C) (08/16 0400) Pulse Rate:  [83-100] 88 (08/16 0500) Cardiac Rhythm: Normal sinus rhythm (08/15 2000) Resp:  [11-25] 18 (08/16 0500) BP: (79-120)/(41-78) 100/57 (08/16 0500) SpO2:  [90 %-100 %] 91 % (08/16 0500) Arterial Line BP: (94-117)/(47-62) 108/49 (08/15 1400) Weight:  [191 lb 12.8 oz (87 kg)] 191 lb 12.8 oz (87 kg) (08/16 0500)  Filed Weights   11/10/15 0500 11/11/15 0500 11/12/15 0500  Weight: 179 lb 0.2 oz (81.2 kg) 190 lb 14.7 oz (86.6 kg) 191 lb 12.8 oz (87 kg)    Weight change: 14.1 oz (0.4 kg)   Hemodynamic parameters for last 24 hours: PAP: (41-57)/(25-34) 41/25 CO:  [4.2 L/min] 4.2 L/min CI:  [2.3 L/min/m2] 2.3 L/min/m2  Intake/Output from previous day: 08/15 0701 - 08/16 0700 In: 955.3 [P.O.:240; I.V.:655.3; IV Piggyback:50] Out: 820 [Urine:810; Chest Tube:10]  Intake/Output this shift: No intake/output data recorded.  Current Meds: Scheduled Meds: . acetaminophen  1,000 mg Oral Q6H   Or  . acetaminophen (TYLENOL) oral liquid 160 mg/5 mL  1,000 mg Per Tube Q6H  . aspirin EC  325 mg Oral Daily   Or  . aspirin  324 mg Per Tube Daily  . bisacodyl  10 mg Oral Daily   Or  . bisacodyl  10 mg Rectal Daily  . docusate sodium  200 mg Oral Daily  . enoxaparin (LOVENOX) injection  40 mg Subcutaneous QHS  . ferrous  [id number] C-folic acid  1 capsule Oral TID PC  . insulin aspart  0-24 Units Subcutaneous Q4H  . insulin detemir  20 Units Subcutaneous Daily  . metoprolol tartrate  12.5 mg Oral BID   Or  . metoprolol tartrate  12.5 mg Per Tube BID  . pantoprazole  40 mg Oral Daily  . rosuvastatin  40 mg Oral q1800  . sertraline  50 mg Oral Daily  . sodium chloride flush  3 mL Intravenous Q12H   Continuous Infusions: . sodium chloride Stopped (11/11/15 1200)  . sodium chloride    . sodium chloride    . dexmedetomidine Stopped (11/10/15 1906)  . lactated ringers 20 mL/hr at 11/12/15 0600  . lactated ringers    . nitroGLYCERIN Stopped (11/11/15 0315)  . phenylephrine (NEO-SYNEPHRINE) Adult infusion Stopped (11/11/15 1400)   PRN Meds:.sodium chloride, lactated ringers, levalbuterol, metoprolol, midazolam, morphine injection, ondansetron (ZOFRAN) IV, oxyCODONE, sodium chloride flush, traMADol  General appearance: alert and cooperative Neurologic: intact Heart: regular rate and rhythm, S1, S2 normal, no murmur, click, rub or gallop Lungs: diminished breath sounds bibasilar Abdomen: soft, non-tender; bowel sounds normal; no masses,  no organomegaly Extremities: extremities normal, atraumatic, no cyanosis or edema and Homans sign is negative, no sign of DVT Wound: sternum stable  patinet says left leg is "heavy" has full and equal strenth in both legs and arms Lab Results: CBC: Recent Labs  11/11/15 0415 11/11/15 1700 11/11/15 1709  WBC 9.4 10.0  --   HGB 9.0* 9.0* 8.5*  HCT 27.1* 26.5* 25.0*  PLT 129* 109*  --    BMET:  Recent Labs  11/11/15 0415  11/11/15 1709 11/12/15 0300  NA 141  --  140 136  K 4.2  --  4.2 4.0  CL 109  --  101 107  CO2 25  --   --  26  GLUCOSE 117*  --  141* 151*  BUN <5*  --  10 12  CREATININE 0.68  < > 0.90 0.99  CALCIUM 8.5*  --   --  8.3*  < > = values in this interval not displayed.  CMET: Lab Results  Component Value Date   WBC 10.0  11/11/2015   HGB 8.5 (L) 11/11/2015   HCT 25.0 (L) 11/11/2015   PLT 109 (L) 11/11/2015   GLUCOSE 151 (H) 11/12/2015   CHOL 214 (H) 07/14/2013   TRIG 118 07/14/2013   HDL 36 (L) 07/14/2013   LDLCALC 154 (H) 07/14/2013   ALT 14 11/09/2015   AST 21 11/09/2015   NA 136 11/12/2015   K 4.0 11/12/2015   CL 107 11/12/2015   CREATININE 0.99 11/12/2015   BUN 12 11/12/2015   CO2 26 11/12/2015   TSH 0.763 01/23/2012   INR 1.44 11/10/2015   HGBA1C 8.2 (H) 11/09/2015    PT/INR:  Recent Labs  11/10/15 1610  LABPROT 17.7*  INR 1.44   Radiology: No results found.   Assessment/Plan: S/P Procedure(s) (LRB): CORONARY ARTERY BYPASS GRAFTING (CABG), ON PUMP, TIMES FOUR, USING LEFT INTERNAL MAMMARY ARTERY, BILATERAL GREATER SAPHENOUS VEINS HARVESTED ENDOSCOPICALLY (N/A) TRANSESOPHAGEAL ECHOCARDIOGRAM (TEE) (N/A) Mobilize Diuresis Diabetes control d/c tubes/lines Plan for transfer to step-down: see transfer orders Expected Acute  Blood - loss Anemia Very low walking,  Renal function stable  [name] 11/12/2015 7:18 AM

## 2015-11-12 NOTE — Progress Notes (Signed)
Report called to 2W RN 

## 2015-11-13 ENCOUNTER — Inpatient Hospital Stay (HOSPITAL_COMMUNITY): Payer: BLUE CROSS/BLUE SHIELD

## 2015-11-13 LAB — TYPE AND SCREEN
ABO/RH(D): O POS
Antibody Screen: NEGATIVE
Unit division: 0
Unit division: 0
Unit division: 0
Unit division: 0

## 2015-11-13 LAB — BASIC METABOLIC PANEL
Anion gap: 10 (ref 5–15)
BUN: 13 mg/dL (ref 6–20)
CO2: 25 mmol/L (ref 22–32)
Calcium: 8.6 mg/dL — ABNORMAL LOW (ref 8.9–10.3)
Chloride: 104 mmol/L (ref 101–111)
Creatinine, Ser: 0.9 mg/dL (ref 0.44–1.00)
GFR calc Af Amer: >60 mL/min (ref >60)
GFR calc non Af Amer: >60 mL/min (ref >60)
Glucose, Bld: 96 mg/dL (ref 65–99)
Potassium: 3.8 mmol/L (ref 3.5–5.1)
Sodium: 139 mmol/L (ref 135–145)

## 2015-11-13 LAB — GLUCOSE, CAPILLARY
Glucose-Capillary: 79 mg/dL (ref 65–99)
Glucose-Capillary: 100 mg/dL — ABNORMAL HIGH (ref 65–99)
Glucose-Capillary: 139 mg/dL — ABNORMAL HIGH (ref 65–99)
Glucose-Capillary: 126 mg/dL — ABNORMAL HIGH (ref 65–99)

## 2015-11-13 LAB — CBC
HCT: 25.9 % — ABNORMAL LOW (ref 36.0–46.0)
Hemoglobin: 8.5 g/dL — ABNORMAL LOW (ref 12.0–15.0)
MCH: 31 pg (ref 26.0–34.0)
MCHC: 32.8 g/dL (ref 30.0–36.0)
MCV: 94.5 fL (ref 78.0–100.0)
Platelets: 120 10*3/uL — ABNORMAL LOW (ref 150–400)
RBC: 2.74 MIL/uL — ABNORMAL LOW (ref 3.87–5.11)
RDW: 13.7 % (ref 11.5–15.5)
WBC: 13 10*3/uL — ABNORMAL HIGH (ref 4.0–10.5)

## 2015-11-13 MED ORDER — ACETYLCYSTEINE 20 % IN SOLN
2.0000 mL | Freq: Three times a day (TID) | RESPIRATORY_TRACT | Status: DC
  Administered 2015-11-13 – 2015-11-16 (×11): 2 mL via RESPIRATORY_TRACT
  Filled 2015-11-13 (×15): qty 4

## 2015-11-13 MED ORDER — METOPROLOL TARTRATE 25 MG PO TABS
25.0000 mg | ORAL_TABLET | Freq: Two times a day (BID) | ORAL | Status: DC
  Administered 2015-11-13 – 2015-11-18 (×11): 25 mg via ORAL
  Filled 2015-11-13 (×11): qty 1

## 2015-11-13 MED ORDER — LEVALBUTEROL HCL 0.63 MG/3ML IN NEBU
0.6300 mg | INHALATION_SOLUTION | Freq: Four times a day (QID) | RESPIRATORY_TRACT | Status: DC
  Administered 2015-11-13 – 2015-11-15 (×8): 0.63 mg via RESPIRATORY_TRACT
  Filled 2015-11-13 (×8): qty 3

## 2015-11-13 MED ORDER — FUROSEMIDE 10 MG/ML IJ SOLN
40.0000 mg | Freq: Once | INTRAMUSCULAR | Status: AC
  Administered 2015-11-13: 40 mg via INTRAVENOUS
  Filled 2015-11-13: qty 4

## 2015-11-13 MED ORDER — LACTULOSE 10 GM/15ML PO SOLN
20.0000 g | Freq: Every day | ORAL | Status: DC
  Administered 2015-11-13: 20 g via ORAL
  Filled 2015-11-13 (×4): qty 30

## 2015-11-13 MED ORDER — GUAIFENESIN ER 600 MG PO TB12
600.0000 mg | ORAL_TABLET | Freq: Two times a day (BID) | ORAL | Status: DC
  Administered 2015-11-13 – 2015-11-18 (×11): 600 mg via ORAL
  Filled 2015-11-13 (×11): qty 1

## 2015-11-13 NOTE — Care Management Note (Signed)
Case Management Note Marvetta Gibbons RN, BSN Unit 2W-Case Manager [phone]  Patient Details  Name: Candace [name] MRN: [id number] Date of Birth: 23-Jun-1950  Subjective/Objective:     Pt admitted s/p CABGx2- tx from ICU to 2W on 8/16- CM to follow               Action/Plan: PTA pt lived at home- PT eval ordered- recommendation for [facility]- will need orders prior to discharge- CM will follow  Expected Discharge Date:                  Expected Discharge Plan:  [place]  In-House Referral:     Discharge planning Services  CM Consult  Post Acute Care Choice:    Choice offered to:     DME Arranged:    DME Agency:     HH Arranged:    [place] Agency:     Status of Service:  In process, will continue to follow  If discussed at Long Length of Stay Meetings, dates discussed:    Additional Comments:  Dawayne Patricia, RN 11/13/2015, 3:59 PM

## 2015-11-13 NOTE — Evaluation (Signed)
Physical Therapy Evaluation Patient Details Name: LYNEL LYNES MRN: [id number] DOB: October 02, 1950 Today's Date: 11/13/2015   History of Present Illness  65 yo admitted with CP and CABGx 4 on 8/15. PMHx: DM, HTN, HLD, CVA  Clinical Impression  Pt reports fatigue from prior ambulation but willing to participate and ambulate again. Pt educated for sternal precautions with handout provided. Pt with good bil LE strength of 4/5 despite feeling of heaviness with gait in LLE. Pt with decreased activity tolerance, mobility, gait and function who will benefit from acute therapy to maximize mobility, adherence to precautions, gait and independence to decrease burden of care.   HR 98-115 with mobility, sats 89-98% on RA with activity with pt on .5L end of session to maintain 94%. Cues for pursed lip breathing throughout.     Follow Up Recommendations Home health PT    Equipment Recommendations  Rolling walker with 5" wheels;3in1 (PT)    Recommendations for Other Services       Precautions / Restrictions Precautions Precautions: Sternal;Fall      Mobility  Bed Mobility Overal bed mobility: Needs Assistance Bed Mobility: Rolling;Sidelying to Sit;Sit to Sidelying Rolling: Mod assist Sidelying to sit: Mod assist     Sit to sidelying: Mod assist General bed mobility comments: cues for sequence with assist to rotate pelvis, elevate trunk and bring legs back onto surface.   Transfers Overall transfer level: Needs assistance   Transfers: Sit to/from Stand Sit to Stand: Min guard         General transfer comment: cues for hand placement, anterior translation and sequence.   Ambulation/Gait Ambulation/Gait assistance: Min guard Ambulation Distance (Feet): 90 Feet Assistive device: Rolling walker (2 wheeled) Gait Pattern/deviations: Step-through pattern;Decreased stance time - right   Gait velocity interpretation: Below normal speed for age/gender General Gait Details: Pt with  frequent standing rest breaks and reporting that her left leg feels heavy throughout. Cues for posture, breathing technique and continued step through pattern. Chair followed but not needed  Stairs            Wheelchair Mobility    Modified Rankin (Stroke Patients Only)       Balance                                             Pertinent Vitals/Pain Pain Assessment: 0-10 Pain Score: 6  Pain Location: chest Pain Descriptors / Indicators: Aching Pain Intervention(s): Limited activity within patient's tolerance;Repositioned;Monitored during session    Home Living Family/patient expects to be discharged to:: Private residence Living Arrangements: Alone Available Help at Discharge: Family;Available 24 hours/day Type of Home: Apartment Home Access: Stairs to enter   Entrance Stairs-Number of Steps: 2 Home Layout: One level Home Equipment: None Additional Comments: Pt lives in an apartment alone with 2 steps. Plans to go to dgtrs house with 3 steps    Prior Function Level of Independence: Independent               Hand Dominance        Extremity/Trunk Assessment   Upper Extremity Assessment: Generalized weakness (limted due to precautions)           Lower Extremity Assessment: Overall WFL for tasks assessed      Cervical / Trunk Assessment: Kyphotic  Communication   Communication: No difficulties  Cognition Arousal/Alertness: Awake/alert Behavior During Therapy: [facility] for tasks assessed/performed  Overall Cognitive Status: Within Functional Limits for tasks assessed       Memory: Decreased recall of precautions              General Comments      Exercises        Assessment/Plan    PT Assessment Patient needs continued PT services  PT Diagnosis Difficulty walking;Acute pain   PT Problem List Decreased activity tolerance;Decreased knowledge of use of DME;Decreased mobility;Cardiopulmonary status limiting  activity;Decreased knowledge of precautions;Pain  PT Treatment Interventions DME instruction;Gait training;Functional mobility training;Stair training;Patient/family education;Therapeutic exercise;Therapeutic activities   PT Goals (Current goals can be found in the Care Plan section) Acute Rehab PT Goals Patient Stated Goal: return home PT Goal Formulation: With patient/family Time For Goal Achievement: 11/27/15 Potential to Achieve Goals: Good    Frequency Min 3X/week   Barriers to discharge        Co-evaluation               End of Session Equipment Utilized During Treatment: Gait belt Activity Tolerance: Patient tolerated treatment well Patient left: in bed;with call bell/phone within reach;with family/visitor present Nurse Communication: Mobility status;Precautions         Time: [id number] PT Time Calculation (min) (ACUTE ONLY): 25 min   Charges:   PT Evaluation $PT Eval Moderate Complexity: 1 Procedure PT Treatments $Therapeutic Activity: 8-22 mins   PT G CodesMelford Aase 11/13/2015, 1:23 PM Elwyn Reach, [place]

## 2015-11-13 NOTE — Progress Notes (Addendum)
      [address]       [place] [address]             [phone]      3 Days Post-Op Procedure(s) (LRB): CORONARY ARTERY BYPASS GRAFTING (CABG), ON PUMP, TIMES FOUR, USING LEFT INTERNAL MAMMARY ARTERY, BILATERAL GREATER SAPHENOUS VEINS HARVESTED ENDOSCOPICALLY (N/A) TRANSESOPHAGEAL ECHOCARDIOGRAM (TEE) (N/A)   Subjective:  Candace [name] complains of some mild shortness of breath and wheezing.  She had some nausea overnight.  She is struggling to expectorate sputum.  No BM  Objective: Vital signs in last 24 hours: Temp:  [98.1 F (36.7 C)-98.8 F (37.1 C)] 98.1 F (36.7 C) (08/17 0502) Pulse Rate:  [88-119] 102 (08/17 0502) Cardiac Rhythm: Sinus tachycardia;Bundle branch block (08/16 1900) Resp:  [17-22] 20 (08/17 0502) BP: (92-164)/(53-79) 103/66 (08/17 0637) SpO2:  [84 %-100 %] 96 % (08/17 0502) Weight:  [187 lb 11.2 oz (85.1 kg)] 187 lb 11.2 oz (85.1 kg) (08/17 0502)  Intake/Output from previous day: 08/16 0701 - 08/17 0700 In: 500 [P.O.:480; I.V.:20] Out: 750 [Urine:750]  General appearance: alert, cooperative and no distress Heart: regular rate and rhythm and tachy Lungs: little to no air movement on right, wheezing scattered Abdomen: soft, non-tender; bowel sounds normal; no masses,  no organomegaly Extremities: edema trace Wound: clean and dr  Lab Results:  Recent Labs  11/11/15 1700 11/11/15 1709 11/13/15 0317  WBC 10.0  --  13.0*  HGB 9.0* 8.5* 8.5*  HCT 26.5* 25.0* 25.9*  PLT 109*  --  120*   BMET:  Recent Labs  11/12/15 0300 11/13/15 0317  NA 136 139  K 4.0 3.8  CL 107 104  CO2 26 25  GLUCOSE 151* 96  BUN 12 13  CREATININE 0.99 0.90  CALCIUM 8.3* 8.6*    PT/INR:  Recent Labs  11/10/15 1610  LABPROT 17.7*  INR 1.44   ABG    Component Value Date/Time   PHART 7.348 (L) 11/11/2015 0102   HCO3 26.4 (H) 11/11/2015 0102   TCO2 26 11/11/2015 1709   O2SAT 95.0 11/11/2015 0102   CBG (last 3)   Recent Labs  11/12/15 1853 11/12/15 2113 11/13/15 0617  GLUCAP 157* 135* 79    Assessment/Plan: S/P Procedure(s) (LRB): CORONARY ARTERY BYPASS GRAFTING (CABG), ON PUMP, TIMES FOUR, USING LEFT INTERNAL MAMMARY ARTERY, BILATERAL GREATER SAPHENOUS VEINS HARVESTED ENDOSCOPICALLY (N/A) TRANSESOPHAGEAL ECHOCARDIOGRAM (TEE) (N/A)  1. CV- Sinus Tach, BP is labile.. Will increase Lopressor if BP allows 2. Pulm- + wheezing, CXR with worsening atelectasis on right... Will add Mucomyst, change Mucinex to scheduled, will add flutter valve 3. Renal- creatinine has been WNL, remains hypervolemic, will repeat Lasix IV today 4. Expected post operative blood loss anemia- Hgb stable at 8.5 5. GI- nausea, zofran helping, Lactulose prn 6. DM- sugars controlled continue regimen 7. Dispo- patient with tachycardia.. Increase Lopressor.... Needs aggressive pulm toilet, repeat CXR in AM   LOS: 6 days    BARRETT, ERIN 11/13/2015  increase pul toilet , pulmonary saw patient in icu, will ask to return if no improvement  I have seen and examined Candace [name] and agree with the above assessment  and plan.  [name] MD Beeper [phone] Office [phone] 11/13/2015 8:29 AM

## 2015-11-13 NOTE — Progress Notes (Signed)
PT Cancellation Note  Patient Details Name: Candace [name] MRN: [id number] DOB: [birth year]   Cancelled Treatment:    Reason Eval/Treat Not Completed: Other (comment) (pt currently up and walking with cardiac rehab)   Lanetta Inch [facility] 11/13/2015, 10:37 AM Elwyn Reach, [place]

## 2015-11-13 NOTE — Progress Notes (Signed)
CARDIAC REHAB PHASE I   PRE:  Rate/Rhythm: 105 ST BBB  BP:  Supine:   Sitting: 108/63  Standing:    SaO2: 98% 2L  MODE:  Ambulation: 60 ft   POST:  Rate/Rhythm: 136 ST BBB  BP:  Supine:   Sitting: 143/69  Standing:    SaO2: 98% 2L room and 94% 2L hall 1014-1050 Pt very diaphoretic and heart rate elevated. Walked 60 ft on 2L with rollator, gait belt use and asst x 2. Had to sit at 30 ft to rest. Pt moving left leg better today and she stated that also. To recliner after walk. Pt has difficulty turning to sit using rollator as she grabs to everything as turning. PT to see today also.  Will continue to see as asst x 2.   Graylon Good, RN BSN  11/13/2015 10:47 AM

## 2015-11-14 ENCOUNTER — Inpatient Hospital Stay (HOSPITAL_COMMUNITY): Payer: BLUE CROSS/BLUE SHIELD

## 2015-11-14 ENCOUNTER — Encounter (HOSPITAL_COMMUNITY): Payer: Self-pay | Admitting: Cardiothoracic Surgery

## 2015-11-14 DIAGNOSIS — J9801 Acute bronchospasm: Secondary | ICD-10-CM

## 2015-11-14 LAB — BASIC METABOLIC PANEL
Anion gap: 10 (ref 5–15)
BUN: 13 mg/dL (ref 6–20)
CO2: 26 mmol/L (ref 22–32)
Calcium: 8.6 mg/dL — ABNORMAL LOW (ref 8.9–10.3)
Chloride: 101 mmol/L (ref 101–111)
Creatinine, Ser: 0.81 mg/dL (ref 0.44–1.00)
GFR calc Af Amer: >60 mL/min (ref >60)
GFR calc non Af Amer: >60 mL/min (ref >60)
Glucose, Bld: 96 mg/dL (ref 65–99)
Potassium: 3.8 mmol/L (ref 3.5–5.1)
Sodium: 137 mmol/L (ref 135–145)

## 2015-11-14 LAB — CBC
HCT: 25.9 % — ABNORMAL LOW (ref 36.0–46.0)
Hemoglobin: 8.1 g/dL — ABNORMAL LOW (ref 12.0–15.0)
MCH: 30.1 pg (ref 26.0–34.0)
MCHC: 31.3 g/dL (ref 30.0–36.0)
MCV: 96.3 fL (ref 78.0–100.0)
Platelets: 132 10*3/uL — ABNORMAL LOW (ref 150–400)
RBC: 2.69 MIL/uL — ABNORMAL LOW (ref 3.87–5.11)
RDW: 13.6 % (ref 11.5–15.5)
WBC: 11.5 10*3/uL — ABNORMAL HIGH (ref 4.0–10.5)

## 2015-11-14 LAB — GLUCOSE, CAPILLARY
Glucose-Capillary: 91 mg/dL (ref 65–99)
Glucose-Capillary: 198 mg/dL — ABNORMAL HIGH (ref 65–99)
Glucose-Capillary: 215 mg/dL — ABNORMAL HIGH (ref 65–99)
Glucose-Capillary: 194 mg/dL — ABNORMAL HIGH (ref 65–99)

## 2015-11-14 MED ORDER — POTASSIUM CHLORIDE CRYS ER 20 MEQ PO TBCR
20.0000 meq | EXTENDED_RELEASE_TABLET | Freq: Every day | ORAL | Status: DC
  Administered 2015-11-15 – 2015-11-18 (×4): 20 meq via ORAL
  Filled 2015-11-14 (×4): qty 1

## 2015-11-14 MED ORDER — IPRATROPIUM BROMIDE 0.02 % IN SOLN
0.5000 mg | Freq: Four times a day (QID) | RESPIRATORY_TRACT | Status: DC
  Administered 2015-11-14 – 2015-11-15 (×4): 0.5 mg via RESPIRATORY_TRACT
  Filled 2015-11-14 (×4): qty 2.5

## 2015-11-14 MED ORDER — METHYLPREDNISOLONE SODIUM SUCC 40 MG IJ SOLR
40.0000 mg | Freq: Once | INTRAMUSCULAR | Status: AC
  Administered 2015-11-14: 40 mg via INTRAVENOUS
  Filled 2015-11-14: qty 1

## 2015-11-14 MED ORDER — FUROSEMIDE 40 MG PO TABS
40.0000 mg | ORAL_TABLET | Freq: Every day | ORAL | Status: AC
  Administered 2015-11-15 – 2015-11-17 (×3): 40 mg via ORAL
  Filled 2015-11-14 (×3): qty 1

## 2015-11-14 MED ORDER — FUROSEMIDE 40 MG PO TABS
40.0000 mg | ORAL_TABLET | Freq: Once | ORAL | Status: AC
  Administered 2015-11-14: 40 mg via ORAL
  Filled 2015-11-14: qty 1

## 2015-11-14 NOTE — Progress Notes (Addendum)
[address]       [place] [address]             [phone]      4 Days Post-Op Procedure(s) (LRB): CORONARY ARTERY BYPASS GRAFTING (CABG), ON PUMP, TIMES FOUR, USING LEFT INTERNAL MAMMARY ARTERY, BILATERAL GREATER SAPHENOUS VEINS HARVESTED ENDOSCOPICALLY (N/A) TRANSESOPHAGEAL ECHOCARDIOGRAM (TEE) (N/A) Subjective: Fatigued but improving  Objective: Vital signs in last 24 hours: Temp:  [97.7 F (36.5 C)-98.7 F (37.1 C)] 97.7 F (36.5 C) (08/18 0554) Pulse Rate:  [90-115] 90 (08/18 0554) Cardiac Rhythm: Normal sinus rhythm;Bundle branch block (08/17 1900) Resp:  [17-20] 18 (08/18 0554) BP: (107-143)/(61-69) 107/62 (08/18 0554) SpO2:  [95 %-99 %] 98 % (08/18 0554) Weight:  [189 lb 6.4 oz (85.9 kg)] 189 lb 6.4 oz (85.9 kg) (08/18 0554)  Hemodynamic parameters for last 24 hours:    Intake/Output from previous day: 08/17 0701 - 08/18 0700 In: 720 [P.O.:720] Out: 550 [Urine:550] Intake/Output this shift: No intake/output data recorded.  General appearance: alert, cooperative, fatigued and no distress Heart: regular rate and rhythm Lungs: diffuse ronchi, + upper airway pseudo-wheeze, dim in bases Abdomen: benign Extremities: min edema Wound: healing well  Lab Results:  Recent Labs  11/13/15 0317 11/14/15 0508  WBC 13.0* 11.5*  HGB 8.5* 8.1*  HCT 25.9* 25.9*  PLT 120* 132*   BMET:  Recent Labs  11/13/15 0317 11/14/15 0508  NA 139 137  K 3.8 3.8  CL 104 101  CO2 25 26  GLUCOSE 96 96  BUN 13 13  CREATININE 0.90 0.81  CALCIUM 8.6* 8.6*    PT/INR: No results for input(s): LABPROT, INR in the last 72 hours. ABG    Component Value Date/Time   PHART 7.348 (L) 11/11/2015 0102   HCO3 26.4 (H) 11/11/2015 0102   TCO2 26 11/11/2015 1709   O2SAT 95.0 11/11/2015 0102   CBG (last 3)   Recent Labs  11/13/15 1617 11/13/15 2128 11/14/15 0638  GLUCAP 139* 126* 91    Meds Scheduled Meds: . acetylcysteine  2 mL Nebulization TID    . aspirin EC  325 mg Oral Daily  . docusate sodium  200 mg Oral Daily  . enoxaparin (LOVENOX) injection  40 mg Subcutaneous QHS  . ferrous Q000111Q C-folic acid  1 capsule Oral TID PC  . glipiZIDE  10 mg Oral Q breakfast  . glipiZIDE  5 mg Oral Q supper  . guaiFENesin  600 mg Oral BID  . insulin aspart  0-24 Units Subcutaneous TID AC & HS  . lactulose  20 g Oral Daily  . levalbuterol  0.63 mg Nebulization Q6H  . linagliptin  5 mg Oral Daily  . metFORMIN  500 mg Oral BID WC  . metoprolol tartrate  25 mg Oral BID  . pantoprazole  40 mg Oral QAC breakfast  . rosuvastatin  40 mg Oral q1800  . sertraline  50 mg Oral Daily  . sodium chloride flush  3 mL Intravenous Q12H   Continuous Infusions:  PRN Meds:.sodium chloride, bisacodyl **OR** bisacodyl, levalbuterol, ondansetron **OR** ondansetron (ZOFRAN) IV, oxyCODONE, sodium chloride flush, traMADol  Xrays Dg Chest 2 View  Result Date: 11/13/2015 CLINICAL DATA:  Open heart surgery.  Shortness of breath . EXAM: CHEST  2 VIEW COMPARISON:  11/12/2015. FINDINGS: Interim removal of right IJ sheath. Prior CABG. Cardiomegaly. Partial clearing of bilateral pulmonary interstitial edema. Low lung volumes with persistent bibasilar atelectasis and/or infiltrates/edema. Small bilateral pleural effusions. IMPRESSION: 1.  Removal of right IJ sheath. 2.Prior CABG. Stable cardiomegaly. Partial clearing of bilateral mild pulmonary interstitial edema. Low lung volumes with persistent bibasilar atelectasis and/or infiltrates/edema. Small bilateral pleural effusions. Electronically Signed   By: Marcello Moores  Register   On: 11/13/2015 07:41   Dg Chest Port 1 View  Result Date: 11/14/2015 CLINICAL DATA:  Chest pain and weakness.  Wheezing for 2 days. EXAM: PORTABLE CHEST 1 VIEW COMPARISON:  11/05/2015 and 11/13/2015 FINDINGS: Low lung volumes with persistent densities at the right lung base that are suggestive for pleural fluid and volume loss. Again noted are  post CABG changes in the chest. Cardiac silhouette remains enlarged. No evidence for a large pneumothorax. Persistent densities along the medial left lung base are most compatible with volume loss. IMPRESSION: Low lung volumes with persistent bibasilar atelectasis. Evidence for right pleural fluid. Stable cardiomegaly. Electronically Signed   By: Markus Daft M.D.   On: 11/14/2015 07:37    Assessment/Plan: S/P Procedure(s) (LRB): CORONARY ARTERY BYPASS GRAFTING (CABG), ON PUMP, TIMES FOUR, USING LEFT INTERNAL MAMMARY ARTERY, BILATERAL GREATER SAPHENOUS VEINS HARVESTED ENDOSCOPICALLY (N/A) TRANSESOPHAGEAL ECHOCARDIOGRAM (TEE) (N/A)  1 tachy at times, cont beta blocker- also significantly deconditioned 2 pulm remains slow to improve- cont nebs/pulm toilet, add atrovent. Add flutter valve 3 anemia is stable 4 leukocytosis improved 5 sugars adeq controlled   LOS: 7 days    [name],Candace E 11/14/2015   Fu ekg in am, developed LBBB post op Will ask pulmonary to see again, will need plan for COPD treatment as goes home I have seen and examined Candace [name] and agree with the above assessment  and plan.  [name] MD Beeper [phone] Office [phone] 11/14/2015 4:18 PM

## 2015-11-14 NOTE — Progress Notes (Signed)
CARDIAC REHAB PHASE I   PRE:  Rate/Rhythm: 112 ST BBB  BP:  Supine: 108/64  Sitting:   Standing:    SaO2: 85%RA  94% 2L  MODE:  Ambulation: 150 ft   POST:  Rate/Rhythm: 131 ST BBB  BP:  Supine:   Sitting: 127/63  Standing:    SaO2: 95%2L 1355-1440 Pt encouraged to walk again. Pt stated thought of getting up is worse than actually walking. Pt had to be put back on oxygen as sats down on RA. Pt walked 150 ft on 2L with rolling walker, gait belt use, and asst x 2. Did not have to sit down. Took a couple of standing rest breaks. To bathroom and then to bed at her request. Could not encourage to go to recliner. Encouraged id and flutter valve. Can be asst x 1.   Graylon Good, RN BSN  11/14/2015 2:35 PM

## 2015-11-14 NOTE — Progress Notes (Signed)
Physical Therapy Treatment Patient Details Name: Candace [name] MRN: [id number] DOB: 23-Apr-1950 Today's Date: 11/14/2015    History of Present Illness 65 yo admitted with CP and CABGx 4 on 8/15. PMHx: DM, HTN, HLD, CVA    PT Comments    Pt required encouragement to participate today and dgtr present to assist. Pt with improved bed mobility and gait but requires education for precautions as only able to recall not to push or pull. Pt educated for HEP and functional mobility. Will continue to follow and attempt stairs next session.   HR 94-121 with activity sats 90-95% on RA  Follow Up Recommendations  Home health PT     Equipment Recommendations  Rolling walker with 5" wheels;3in1 (PT)    Recommendations for Other Services       Precautions / Restrictions Precautions Precautions: Sternal;Fall    Mobility  Bed Mobility Overal bed mobility: Needs Assistance Bed Mobility: Sidelying to Sit;Sit to Sidelying;Rolling Rolling: Min assist Sidelying to sit: Min assist     Sit to sidelying: Min assist General bed mobility comments: cues for sequence with assist to rotate pelvis, elevate trunk and bring legs back onto surface.   Transfers Overall transfer level: Needs assistance   Transfers: Sit to/from Stand Sit to Stand: Supervision         General transfer comment: cues for hand placement and anterior translation  Ambulation/Gait Ambulation/Gait assistance: Min guard Ambulation Distance (Feet): 110 Feet Assistive device: Rolling walker (2 wheeled) Gait Pattern/deviations: Step-through pattern;Decreased stride length   Gait velocity interpretation: Below normal speed for age/gender General Gait Details: decreased standing rest breaks this trial. short steps with increased distance and improved when given slight assist to advance RW. Cues for pursed lip breathing with sats 90-95% on RA   Stairs            Wheelchair Mobility    Modified Rankin (Stroke  Patients Only)       Balance                                    Cognition Arousal/Alertness: Awake/alert Behavior During Therapy: Flat affect Overall Cognitive Status: Within Functional Limits for tasks assessed       Memory: Decreased recall of precautions              Exercises General Exercises - Lower Extremity Long Arc Quad: AROM;Both;10 reps;Seated Hip ABduction/ADduction: AROM;Both;10 reps;Supine Hip Flexion/Marching: AROM;Both;10 reps;Seated Toe Raises: AROM;Both;10 reps;Seated    General Comments        Pertinent Vitals/Pain Pain Score: 7  Pain Location: chest Pain Descriptors / Indicators: Aching Pain Intervention(s): Limited activity within patient's tolerance;Monitored during session;Repositioned;Premedicated before session    Home Living                      Prior Function            PT Goals (current goals can now be found in the care plan section) Progress towards PT goals: Progressing toward goals    Frequency       PT Plan Current plan remains appropriate    Co-evaluation             End of Session Equipment Utilized During Treatment: Gait belt Activity Tolerance: Patient tolerated treatment well Patient left: in bed;with call bell/phone within reach;with family/visitor present     Time: [id number] PT Time Calculation (min) (ACUTE ONLY): 35  min  Charges:  $Gait Training: 8-22 mins $Therapeutic Activity: 8-22 mins                    G Codes:      Melford Aase Dec 02, 2015, 9:46 AM Elwyn Reach, [place]

## 2015-11-14 NOTE — Progress Notes (Signed)
PULMONARY / CRITICAL CARE MEDICINE   Name: Candace [name] MRN: [id number] DOB: 04/13/1950    ADMISSION DATE:  11/07/2015 CONSULTATION DATE:  8/14  REFERRING MD:  Servando Snare   CHIEF COMPLAINT:  Chronic respiratory failure and COPD   HISTORY OF PRESENT ILLNESS:   65 year old female w/ known h/o CAD. Transferred from [facility] after newly discovered severe 3V disease by cardiac cath. Pre-op Went for CABG 8/14.  Came back extubated.  Poor PFTs and anticipated will be difficult to wean to PCCM was consulted.   SUBJECTIVE:  No chest pain,  C/o dyspnea + wheezing on walking Diuresing well   VITAL SIGNS: BP 107/62 (BP Location: Left Arm)   Pulse (!) 105   Temp 97.7 F (36.5 C) (Oral)   Resp 18   Ht 5\' 3"  (1.6 m)   Wt 189 lb 6.4 oz (85.9 kg)   SpO2 95%   BMI 33.55 kg/m   HEMODYNAMICS:    VENTILATOR SETTINGS:    INTAKE / OUTPUT: I/O last 3 completed shifts: In: 720 [P.O.:720] Out: 750 [Urine:750]  PHYSICAL EXAMINATION: General: lying in bed, well built, no distress Neuro:  Alert, interactive HEENT:  [place]/AT, PERRL,no jvd Cardiovascular:  RRR, Nl S1/S2, -M/R/G. Lungs:  CTA bilaterally. Abdomen:  Soft, NT, ND and +BS. Musculoskeletal: 1 + -edema and -tenderness. Skin:  Intact.  LABS:  BMET  Recent Labs Lab 11/12/15 0300 11/13/15 0317 11/14/15 0508  NA 136 139 137  K 4.0 3.8 3.8  CL 107 104 101  CO2 26 25 26   BUN 12 13 13   CREATININE 0.99 0.90 0.81  GLUCOSE 151* 96 96    Electrolytes  Recent Labs Lab 11/10/15 2140 11/11/15 0415 11/11/15 1700 11/12/15 0300 11/13/15 0317 11/14/15 0508  CALCIUM  --  8.5*  --  8.3* 8.6* 8.6*  MG 2.6* 1.9 1.8  --   --   --     CBC  Recent Labs Lab 11/11/15 1700 11/11/15 1709 11/13/15 0317 11/14/15 0508  WBC 10.0  --  13.0* 11.5*  HGB 9.0* 8.5* 8.5* 8.1*  HCT 26.5* 25.0* 25.9* 25.9*  PLT 109*  --  120* 132*    Coag's  Recent Labs Lab 11/07/15 2007 11/10/15 1610  APTT 84* 37*  INR 1.09 1.44    Sepsis  Markers No results for input(s): LATICACIDVEN, PROCALCITON, O2SATVEN in the last 168 hours.  ABG  Recent Labs Lab 11/10/15 2211 11/10/15 2345 11/11/15 0102  PHART 7.335* 7.434 7.348*  PCO2ART 51.8* 37.2 48.3*  PO2ART 428.0* 66.0* 80.0    Liver Enzymes  Recent Labs Lab 11/07/15 2007 11/09/15 0937  AST 16 21  ALT 11* 14  ALKPHOS 77 75  BILITOT 0.3 0.5  ALBUMIN 3.3* 3.3*    Cardiac Enzymes  Recent Labs Lab 11/07/15 2007 11/07/15 2257 11/08/15 0554  TROPONINI <0.03 <0.03 <0.03    Glucose  Recent Labs Lab 11/12/15 2113 11/13/15 0617 11/13/15 1055 11/13/15 1617 11/13/15 2128 11/14/15 0638  GLUCAP 135* 79 100* 139* 126* 91    Imaging Dg Chest Port 1 View  Result Date: 11/14/2015 CLINICAL DATA:  Chest pain and weakness.  Wheezing for 2 days. EXAM: PORTABLE CHEST 1 VIEW COMPARISON:  11/05/2015 and 11/13/2015 FINDINGS: Low lung volumes with persistent densities at the right lung base that are suggestive for pleural fluid and volume loss. Again noted are post CABG changes in the chest. Cardiac silhouette remains enlarged. No evidence for a large pneumothorax. Persistent densities along the medial left lung base are  most compatible with volume loss. IMPRESSION: Low lung volumes with persistent bibasilar atelectasis. Evidence for right pleural fluid. Stable cardiomegaly. Electronically Signed   By: Markus Daft M.D.   On: 11/14/2015 07:37     STUDIES:    CULTURES: None  ANTIBIOTICS: Prophylaxis only  SIGNIFICANT EVENTS: 8/14 CABG  LINES/TUBES: ETT 8/14>>>8/14 L radial a-line 8/14>>> out R IJ PAC 8/14>>> out Foley 8/14>>>  DISCUSSION: 65 year old female with extensive cardiopulmonary history presenting to PCCM for VDRF post CABG -extubated  PFT's noted, - mod restriction, ratio 76 , remote smoker  ASSESSMENT / PLAN:  PULMONARY A: Chronic respiratory failure  Treat as COPD - although PFTs s/o restriction P:  xopenex nebs prn - limited by  tachycardia Solumedrol 40x 1    CARDIOVASCULAR A:  3 V CAD now s/p CABG 8/14 Hyperlipidemia   P:  Post-op per CVTS   ENDOCRINE A:   Diabetes  P:   SSI   Will assess response to steroid tomorrow, needs more diuresis?  Will need  outpt appt  Kara Mead MD. FCCP. [facility] Pager [phone] If no response call [phone]    11/14/2015, 11:00 AM

## 2015-11-15 ENCOUNTER — Other Ambulatory Visit: Payer: Self-pay

## 2015-11-15 LAB — GLUCOSE, CAPILLARY
Glucose-Capillary: 141 mg/dL — ABNORMAL HIGH (ref 65–99)
Glucose-Capillary: 96 mg/dL (ref 65–99)
Glucose-Capillary: 182 mg/dL — ABNORMAL HIGH (ref 65–99)
Glucose-Capillary: 151 mg/dL — ABNORMAL HIGH (ref 65–99)

## 2015-11-15 LAB — BASIC METABOLIC PANEL
Anion gap: 10 (ref 5–15)
BUN: 14 mg/dL (ref 6–20)
CO2: 26 mmol/L (ref 22–32)
Calcium: 9 mg/dL (ref 8.9–10.3)
Chloride: 101 mmol/L (ref 101–111)
Creatinine, Ser: 0.84 mg/dL (ref 0.44–1.00)
GFR calc Af Amer: >60 mL/min (ref >60)
GFR calc non Af Amer: >60 mL/min (ref >60)
Glucose, Bld: 146 mg/dL — ABNORMAL HIGH (ref 65–99)
Potassium: 3.9 mmol/L (ref 3.5–5.1)
Sodium: 137 mmol/L (ref 135–145)

## 2015-11-15 LAB — CBC
HCT: 25 % — ABNORMAL LOW (ref 36.0–46.0)
Hemoglobin: 8.2 g/dL — ABNORMAL LOW (ref 12.0–15.0)
MCH: 31.3 pg (ref 26.0–34.0)
MCHC: 32.8 g/dL (ref 30.0–36.0)
MCV: 95.4 fL (ref 78.0–100.0)
Platelets: 173 10*3/uL (ref 150–400)
RBC: 2.62 MIL/uL — ABNORMAL LOW (ref 3.87–5.11)
RDW: 13.2 % (ref 11.5–15.5)
WBC: 10.3 10*3/uL (ref 4.0–10.5)

## 2015-11-15 MED ORDER — LEVALBUTEROL HCL 0.63 MG/3ML IN NEBU
0.6300 mg | INHALATION_SOLUTION | Freq: Three times a day (TID) | RESPIRATORY_TRACT | Status: DC
  Administered 2015-11-15 – 2015-11-18 (×8): 0.63 mg via RESPIRATORY_TRACT
  Filled 2015-11-15 (×9): qty 3

## 2015-11-15 MED ORDER — METFORMIN HCL ER 750 MG PO TB24
750.0000 mg | ORAL_TABLET | Freq: Two times a day (BID) | ORAL | Status: DC
  Administered 2015-11-15 – 2015-11-17 (×4): 750 mg via ORAL
  Filled 2015-11-15: qty 1
  Filled 2015-11-15 (×2): qty 1.5
  Filled 2015-11-15 (×2): qty 1
  Filled 2015-11-15 (×2): qty 1.5
  Filled 2015-11-15: qty 1

## 2015-11-15 MED ORDER — PREDNISONE 10 MG PO TABS
30.0000 mg | ORAL_TABLET | Freq: Every day | ORAL | Status: DC
  Administered 2015-11-15 – 2015-11-18 (×4): 30 mg via ORAL
  Filled 2015-11-15 (×4): qty 1

## 2015-11-15 MED ORDER — IPRATROPIUM BROMIDE 0.02 % IN SOLN
0.5000 mg | Freq: Three times a day (TID) | RESPIRATORY_TRACT | Status: DC
  Administered 2015-11-15 – 2015-11-18 (×8): 0.5 mg via RESPIRATORY_TRACT
  Filled 2015-11-15 (×9): qty 2.5

## 2015-11-15 NOTE — Progress Notes (Signed)
CARDIAC REHAB PHASE I   PRE:  Rate/Rhythm: 104 ST  BP:  Supine:   Sitting: 110/60  Standing:    SaO2: 92% RA  Decided to walk on 2 L  MODE:  Ambulation: 300 ft   POST:  Rate/Rhythm: 114 ST  BP:  Supine:   Sitting: 132/70  Standing:    SaO2: 99% 2 L  Asked nurse to walk on RA with next ambulation.  Tolerated ambulation very well with assistance X 1, rollator, and 2 L of oxygen.  Very excited to going home hopefully Monday if appropriate.  She would like for her daughter to be present for discharge instructions, she will not return until 6-8 pm, will complete instructions Monday when daughter is present. [id number]  Liliane Channel RN, BSN 11/15/2015 2:45 PM

## 2015-11-15 NOTE — Progress Notes (Signed)
PULMONARY / CRITICAL CARE MEDICINE   Name: Candace [name] MRN: [id number] DOB: 31-Jul-1950    ADMISSION DATE:  11/07/2015 CONSULTATION DATE:  8/14  REFERRING MD:  Servando Snare   CHIEF COMPLAINT:  Chronic respiratory failure and COPD   HISTORY OF PRESENT ILLNESS:   65 year old female w/ known h/o CAD. Transferred from [facility] after newly discovered severe 3V disease by cardiac cath. Pre-op Went for CABG 8/14.  Came back extubated.  Poor PFTs and anticipated will be difficult to wean to PCCM was consulted.   SUBJECTIVE:  Breathing feels better.    VITAL SIGNS: BP 95/76 (BP Location: Left Arm)   Pulse 87   Temp 97.7 F (36.5 C) (Oral)   Resp 20   Ht 5\' 3"  (1.6 m)   Wt 85.7 kg (188 lb 14.4 oz)   SpO2 98%   BMI 33.46 kg/m    INTAKE / OUTPUT: I/O last 3 completed shifts: In: 480 [P.O.:480] Out: 200 [Urine:200]  PHYSICAL EXAMINATION: General: lying in bed,  Having EKG, no distress Neuro:  Alert, appropriate, MAE  HEENT:  [place]/AT, PERRL,no jvd Cardiovascular:  RRR, Nl S1/S2, -M/R/G. Lungs:  resps even non labored on [place], few exp wheeze, few scattered rhonchi  Abdomen:  Soft, NT, ND and +BS. Musculoskeletal: 1 + -edema and -tenderness. Skin:  Intact.  LABS:  BMET  Recent Labs Lab 11/13/15 0317 11/14/15 0508 11/15/15 0232  NA 139 137 137  K 3.8 3.8 3.9  CL 104 101 101  CO2 25 26 26   BUN 13 13 14   CREATININE 0.90 0.81 0.84  GLUCOSE 96 96 146*    Electrolytes  Recent Labs Lab 11/10/15 2140 11/11/15 0415 11/11/15 1700  11/13/15 0317 11/14/15 0508 11/15/15 0232  CALCIUM  --  8.5*  --   < > 8.6* 8.6* 9.0  MG 2.6* 1.9 1.8  --   --   --   --   < > = values in this interval not displayed.  CBC  Recent Labs Lab 11/13/15 0317 11/14/15 0508 11/15/15 0232  WBC 13.0* 11.5* 10.3  HGB 8.5* 8.1* 8.2*  HCT 25.9* 25.9* 25.0*  PLT 120* 132* 173    Coag's  Recent Labs Lab 11/10/15 1610  APTT 37*  INR 1.44    Sepsis Markers No results for input(s):  LATICACIDVEN, PROCALCITON, O2SATVEN in the last 168 hours.  ABG  Recent Labs Lab 11/10/15 2211 11/10/15 2345 11/11/15 0102  PHART 7.335* 7.434 7.348*  PCO2ART 51.8* 37.2 48.3*  PO2ART 428.0* 66.0* 80.0    Liver Enzymes  Recent Labs Lab 11/09/15 0937  AST 21  ALT 14  ALKPHOS 75  BILITOT 0.5  ALBUMIN 3.3*    Cardiac Enzymes No results for input(s): TROPONINI, PROBNP in the last 168 hours.  Glucose  Recent Labs Lab 11/13/15 2128 11/14/15 0638 11/14/15 1105 11/14/15 1628 11/14/15 2220 11/15/15 0623  GLUCAP 126* 91 198* 215* 194* 141*    Imaging No results found.   STUDIES:    CULTURES: None  ANTIBIOTICS:   SIGNIFICANT EVENTS: 8/14 CABG  LINES/TUBES: ETT 8/14>>>8/14 L radial a-line 8/14>>> out R IJ PAC 8/14>>> out Foley 8/14>>>  DISCUSSION: 65 year old female with extensive cardiopulmonary history presenting to PCCM for VDRF post CABG -extubated  PFT's noted, - mod restriction, ratio 76 , remote smoker  ASSESSMENT / PLAN:   Chronic respiratory failure  Treat as COPD - although PFTs s/o restriction P:   xopenex nebs prn - limited by tachycardia Had 40mg  IV  solumedrol x 1 on 8/18 - some improvement.  Unclear if r/t steroids or diuresis but does have some wheeze.  Will add prednisone 30mg  daily with fast taper Continue diuresis as able  Will need outpt pulmonary f/u   3 V CAD now s/p CABG 8/14 Hyperlipidemia   P:  Post-op per CVTS   ENDOCRINE A:   Diabetes  P:   SSI   PCCM will f/u again Monday 8/21.  Please call again sooner if needed.   Nickolas Madrid, NP 11/15/2015  11:09 AM Pager: [phone] or [phone]  Attending Note:  I have examined patient, reviewed labs, studies and notes. I have discussed the case with Shon Millet, and I agree with the data and plans as amended above.  Suspect hypoxemia due to restriction post-op + some component of obstruction. On my eval her wheeze is better. She is using nebs scheduled  and improved w steroids. Will give a steroid taper. Will need pulm outpt follow up w Dr Elsworth Soho, walking oximetry to assess O2 needs before d/c home.   Baltazar Apo, MD, PhD 11/15/2015, 1:39 PM [facility] [phone] or if no answer [phone]

## 2015-11-15 NOTE — Progress Notes (Signed)
[address]       [place] [address]             [phone]      5 Days Post-Op Procedure(s) (LRB): CORONARY ARTERY BYPASS GRAFTING (CABG), ON PUMP, TIMES FOUR, USING LEFT INTERNAL MAMMARY ARTERY, BILATERAL GREATER SAPHENOUS VEINS HARVESTED ENDOSCOPICALLY (N/A) TRANSESOPHAGEAL ECHOCARDIOGRAM (TEE) (N/A) Subjective: Feels like breathing is more comfortable  Objective: Vital signs in last 24 hours: Temp:  [97.7 F (36.5 C)-98.8 F (37.1 C)] 97.7 F (36.5 C) (08/19 0625) Pulse Rate:  [87-115] 87 (08/19 0625) Cardiac Rhythm: Normal sinus rhythm (08/19 0728) Resp:  [20] 20 (08/19 0625) BP: (95-146)/(60-76) 95/76 (08/19 0625) SpO2:  [90 %-98 %] 98 % (08/19 0840) Weight:  [188 lb 14.4 oz (85.7 kg)] 188 lb 14.4 oz (85.7 kg) (08/19 0404)  Hemodynamic parameters for last 24 hours:    Intake/Output from previous day: 08/18 0701 - 08/19 0700 In: 240 [P.O.:240] Out: -  Intake/Output this shift: Total I/O In: -  Out: 150 [Urine:150]  General appearance: alert, cooperative, fatigued and no distress Heart: regular rate and rhythm Lungs: coarse with upper airway insp/exp wheeze Abdomen: benign Extremities: min eema Wound: incis healing well  Lab Results:  Recent Labs  11/14/15 0508 11/15/15 0232  WBC 11.5* 10.3  HGB 8.1* 8.2*  HCT 25.9* 25.0*  PLT 132* 173   BMET:  Recent Labs  11/14/15 0508 11/15/15 0232  NA 137 137  K 3.8 3.9  CL 101 101  CO2 26 26  GLUCOSE 96 146*  BUN 13 14  CREATININE 0.81 0.84  CALCIUM 8.6* 9.0    PT/INR: No results for input(s): LABPROT, INR in the last 72 hours. ABG    Component Value Date/Time   PHART 7.348 (L) 11/11/2015 0102   HCO3 26.4 (H) 11/11/2015 0102   TCO2 26 11/11/2015 1709   O2SAT 95.0 11/11/2015 0102   CBG (last 3)   Recent Labs  11/14/15 1628 11/14/15 2220 11/15/15 0623  GLUCAP 215* 194* 141*    Meds Scheduled Meds: . acetylcysteine  2 mL Nebulization TID  . aspirin EC  325 mg  Oral Daily  . docusate sodium  200 mg Oral Daily  . enoxaparin (LOVENOX) injection  40 mg Subcutaneous QHS  . ferrous Q000111Q C-folic acid  1 capsule Oral TID PC  . furosemide  40 mg Oral Daily  . glipiZIDE  10 mg Oral Q breakfast  . glipiZIDE  5 mg Oral Q supper  . guaiFENesin  600 mg Oral BID  . insulin aspart  0-24 Units Subcutaneous TID AC & HS  . ipratropium  0.5 mg Nebulization Q6H  . lactulose  20 g Oral Daily  . levalbuterol  0.63 mg Nebulization Q6H  . linagliptin  5 mg Oral Daily  . metFORMIN  500 mg Oral BID WC  . metoprolol tartrate  25 mg Oral BID  . pantoprazole  40 mg Oral QAC breakfast  . potassium chloride  20 mEq Oral Daily  . rosuvastatin  40 mg Oral q1800  . sertraline  50 mg Oral Daily  . sodium chloride flush  3 mL Intravenous Q12H   Continuous Infusions:  PRN Meds:.sodium chloride, bisacodyl **OR** bisacodyl, levalbuterol, ondansetron **OR** ondansetron (ZOFRAN) IV, oxyCODONE, sodium chloride flush, traMADol  Xrays Dg Chest Port 1 View  Result Date: 11/14/2015 CLINICAL DATA:  Chest pain and weakness.  Wheezing for 2 days. EXAM: PORTABLE CHEST 1 VIEW COMPARISON:  11/05/2015 and 11/13/2015 FINDINGS:  Low lung volumes with persistent densities at the right lung base that are suggestive for pleural fluid and volume loss. Again noted are post CABG changes in the chest. Cardiac silhouette remains enlarged. No evidence for a large pneumothorax. Persistent densities along the medial left lung base are most compatible with volume loss. IMPRESSION: Low lung volumes with persistent bibasilar atelectasis. Evidence for right pleural fluid. Stable cardiomegaly. Electronically Signed   By: Markus Daft M.D.   On: 11/14/2015 07:37    Assessment/Plan: S/P Procedure(s) (LRB): CORONARY ARTERY BYPASS GRAFTING (CABG), ON PUMP, TIMES FOUR, USING LEFT INTERNAL MAMMARY ARTERY, BILATERAL GREATER SAPHENOUS VEINS HARVESTED ENDOSCOPICALLY (N/A) TRANSESOPHAGEAL ECHOCARDIOGRAM  (TEE) (N/A)  1 conts aggressive pulm management, wean O2 if possible- appreciate pulm. Follow-up 2 tachy to 120's at time- cont beta-blocker, significantly deconditioned 3 sugars should tolerate higher glucophage dose 4 cont gentle diuresis for mild volume overload 5 check 12 lead 6 labs stable 7 one reading of BP elevation, o/w BP well controlled and prob a bit low to start ACE- follow  LOS: 8 days    Candace [name] E 11/15/2015

## 2015-11-16 LAB — GLUCOSE, CAPILLARY
Glucose-Capillary: 113 mg/dL — ABNORMAL HIGH (ref 65–99)
Glucose-Capillary: 129 mg/dL — ABNORMAL HIGH (ref 65–99)
Glucose-Capillary: 137 mg/dL — ABNORMAL HIGH (ref 65–99)
Glucose-Capillary: 225 mg/dL — ABNORMAL HIGH (ref 65–99)
Glucose-Capillary: 73 mg/dL (ref 65–99)

## 2015-11-16 MED ORDER — LISINOPRIL 2.5 MG PO TABS
2.5000 mg | ORAL_TABLET | Freq: Every day | ORAL | Status: DC
  Administered 2015-11-16: 2.5 mg via ORAL
  Filled 2015-11-16: qty 1

## 2015-11-16 NOTE — Progress Notes (Addendum)
[address]       [place] [address]             [phone]      6 Days Post-Op Procedure(s) (LRB): CORONARY ARTERY BYPASS GRAFTING (CABG), ON PUMP, TIMES FOUR, USING LEFT INTERNAL MAMMARY ARTERY, BILATERAL GREATER SAPHENOUS VEINS HARVESTED ENDOSCOPICALLY (N/A) TRANSESOPHAGEAL ECHOCARDIOGRAM (TEE) (N/A) Subjective: Looks and feels much better today . Pulmonary status has been significantly improved with steroids  Objective: Vital signs in last 24 hours: Temp:  [98 F (36.7 C)-98.1 F (36.7 C)] 98.1 F (36.7 C) (08/20 0520) Pulse Rate:  [88-98] 88 (08/20 0520) Cardiac Rhythm: Normal sinus rhythm;Bundle branch block (08/19 1900) Resp:  [16-18] 16 (08/20 0520) BP: (106-152)/(59-84) 152/73 (08/20 0520) SpO2:  [89 %-99 %] 92 % (08/20 0735) Weight:  [188 lb (85.3 kg)] 188 lb (85.3 kg) (08/20 0520)  Hemodynamic parameters for last 24 hours:    Intake/Output from previous day: 08/19 0701 - 08/20 0700 In: 480 [P.O.:480] Out: 1425 [Urine:1425] Intake/Output this shift: Total I/O In: -  Out: 250 [Urine:250]  General appearance: alert, cooperative and no distress Heart: regular rate and rhythm Lungs: mildly dim in bases, no wheeze or ronchi currently Abdomen: benign Extremities: min edema Wound: incia healing well  Lab Results:  Recent Labs  11/14/15 0508 11/15/15 0232  WBC 11.5* 10.3  HGB 8.1* 8.2*  HCT 25.9* 25.0*  PLT 132* 173   BMET:  Recent Labs  11/14/15 0508 11/15/15 0232  NA 137 137  K 3.8 3.9  CL 101 101  CO2 26 26  GLUCOSE 96 146*  BUN 13 14  CREATININE 0.81 0.84  CALCIUM 8.6* 9.0    PT/INR: No results for input(s): LABPROT, INR in the last 72 hours. ABG    Component Value Date/Time   PHART 7.348 (L) 11/11/2015 0102   HCO3 26.4 (H) 11/11/2015 0102   TCO2 26 11/11/2015 1709   O2SAT 95.0 11/11/2015 0102   CBG (last 3)   Recent Labs  11/15/15 2138 11/16/15 0035 11/16/15 0654  GLUCAP 151* 137* 73     Meds Scheduled Meds: . acetylcysteine  2 mL Nebulization TID  . aspirin EC  325 mg Oral Daily  . docusate sodium  200 mg Oral Daily  . enoxaparin (LOVENOX) injection  40 mg Subcutaneous QHS  . ferrous Q000111Q C-folic acid  1 capsule Oral TID PC  . furosemide  40 mg Oral Daily  . glipiZIDE  10 mg Oral Q breakfast  . glipiZIDE  5 mg Oral Q supper  . guaiFENesin  600 mg Oral BID  . insulin aspart  0-24 Units Subcutaneous TID AC & HS  . ipratropium  0.5 mg Nebulization TID  . lactulose  20 g Oral Daily  . levalbuterol  0.63 mg Nebulization TID  . linagliptin  5 mg Oral Daily  . metFORMIN  750 mg Oral BID WC  . metoprolol tartrate  25 mg Oral BID  . pantoprazole  40 mg Oral QAC breakfast  . potassium chloride  20 mEq Oral Daily  . predniSONE  30 mg Oral Q breakfast  . rosuvastatin  40 mg Oral q1800  . sertraline  50 mg Oral Daily  . sodium chloride flush  3 mL Intravenous Q12H   Continuous Infusions:  PRN Meds:.sodium chloride, bisacodyl **OR** bisacodyl, levalbuterol, ondansetron **OR** ondansetron (ZOFRAN) IV, oxyCODONE, sodium chloride flush, traMADol  Xrays No results found.  Assessment/Plan: S/P Procedure(s) (LRB): CORONARY ARTERY BYPASS GRAFTING (CABG), ON  PUMP, TIMES FOUR, USING LEFT INTERNAL MAMMARY ARTERY, BILATERAL GREATER SAPHENOUS VEINS HARVESTED ENDOSCOPICALLY (N/A) TRANSESOPHAGEAL ECHOCARDIOGRAM (TEE) (N/A)  1 significant progress especially with pulm status- cont current management 2 sugars reasonably well controlled for inpatient status 3 no new labs 4 hemodyn stable in sinus- some HTN, but also some low readings- will see if she can tolerate a little ace-inhib, cont gentle diuresis 5 d/c epw's 6 prob home 24-48 hours  LOS: 9 days    GOLD,WAYNE E 11/16/2015  Patient seen and examined, agree with above Looks much better today  Remo Lipps C. Roxan Hockey, MD [facility] [phone]

## 2015-11-16 NOTE — Progress Notes (Addendum)
Pt ambulated 175 ft in hallway with no assistive equipment needed. Pt's oxygen saturation was 92% on room air at rest. Pt's oxygen saturation was 92-94% on room air while ambulating. Pt tolerated well. Pt back to chair at this time with call light within reach. Will continue care.    Grant Fontana BSN, RN

## 2015-11-17 ENCOUNTER — Inpatient Hospital Stay (HOSPITAL_COMMUNITY): Payer: BLUE CROSS/BLUE SHIELD

## 2015-11-17 DIAGNOSIS — J9621 Acute and chronic respiratory failure with hypoxia: Secondary | ICD-10-CM

## 2015-11-17 DIAGNOSIS — R55 Syncope and collapse: Secondary | ICD-10-CM

## 2015-11-17 DIAGNOSIS — R0902 Hypoxemia: Secondary | ICD-10-CM

## 2015-11-17 LAB — GLUCOSE, CAPILLARY
Glucose-Capillary: 71 mg/dL (ref 65–99)
Glucose-Capillary: 81 mg/dL (ref 65–99)
Glucose-Capillary: 73 mg/dL (ref 65–99)
Glucose-Capillary: 206 mg/dL — ABNORMAL HIGH (ref 65–99)
Glucose-Capillary: 201 mg/dL — ABNORMAL HIGH (ref 65–99)

## 2015-11-17 MED ORDER — LISINOPRIL 10 MG PO TABS
10.0000 mg | ORAL_TABLET | Freq: Every day | ORAL | Status: DC
  Administered 2015-11-17 – 2015-11-18 (×2): 10 mg via ORAL
  Filled 2015-11-17 (×2): qty 1

## 2015-11-17 NOTE — Procedures (Signed)
ELECTROENCEPHALOGRAM REPORT  Date of Study: 11/17/2015  Patient's Name: Candace [name] MRN: [id number] Date of Birth: 24-Jan-1951  Referring Provider: Dr. Michiel Sites  Clinical History: This is a 65 year old woman with presyncope  Medications: aspirin EC tablet 325 mg  glipiZIDE (GLUCOTROL) tablet 10 mg  glipiZIDE (GLUCOTROL) tablet 5 mg  lactulose (CHRONULAC) 10 GM/15ML solution 20 g  linagliptin (TRADJENTA) tablet 5 mg  lisinopril (PRINIVIL,ZESTRIL) tablet 10 mg  metFORMIN (GLUCOPHAGE-XR) 24 hr tablet 750 mg  metoprolol tartrate (LOPRESSOR) tablet 25 mg  oxyCODONE (Oxy IR/ROXICODONE) immediate release tablet 5 mg  predniSONE (DELTASONE) tablet 30 mg  rosuvastatin (CRESTOR) tablet 40 mg  sertraline (ZOLOFT) tablet 50 mg  traMADol (ULTRAM) tablet 50 mg   Technical Summary: A multichannel digital EEG recording measured by the international 10-20 system with electrodes applied with paste and impedances below 5000 ohms performed in our laboratory with EKG monitoring in an awake and drowsy patient.  Hyperventilation and photic stimulation were not performed.  The digital EEG was referentially recorded, reformatted, and digitally filtered in a variety of bipolar and referential montages for optimal display.    Description: The patient is awake and drowsy during the recording.  During maximal wakefulness, there is a symmetric, medium voltage 9 Hz posterior dominant rhythm that attenuates with eye opening.  There is occasional focal 5 Hz theta slowing seen over the left temporal region. During drowsiness, there is an increase in theta slowing of the background.  Deeper stages of sleep were not seen.  Hyperventilation and photic stimulation were not performed.  There were no epileptiform discharges or electrographic seizures seen.    EKG lead was unremarkable.  Impression: This awake and drowsy EEG is abnormal due to occasional focal slowing over the left temporal region.  Clinical  Correlation of the above findings indicates focal cerebral dysfunction suggestive of underlying structural or physiologic abnormality. The absence of epileptiform discharges does not exclude a clinical diagnosis of epilepsy.  Clinical correlation is advised.   Ellouise Newer, M.D.

## 2015-11-17 NOTE — Progress Notes (Signed)
EEG completed, results pending. 

## 2015-11-17 NOTE — Progress Notes (Signed)
Physical Therapy Treatment Patient Details Name: Candace [name] MRN: [id number] DOB: [birth year] Today's Date: 11/17/2015    History of Present Illness 65 yo admitted with CP and CABGx 4 on 8/15. PMHx: DM, HTN, HLD, CVA    PT Comments    Pt remarkably improved from last visit. Pt able to ambulate without RW and perform stairs. Deferred HEp at this time due to pt fatigued from gait and moving around in room prior to session. Pt able to state all sternal precautions and only required min cues to maintain with functional activity. Pt safe for D/C home with family.    Follow Up Recommendations  Home health PT     Equipment Recommendations  Rolling walker with 5" wheels;3in1 (PT)    Recommendations for Other Services       Precautions / Restrictions Precautions Precautions: Sternal Restrictions Weight Bearing Restrictions: Yes (sternal precautions )    Mobility  Bed Mobility   Bed Mobility: Sidelying to Sit;Sit to Sidelying   Sidelying to sit: Supervision     Sit to sidelying: Supervision General bed mobility comments: supervision only to encourage arms across chest to prevent pulling at sternum as well as cues not to push heavily to elevate trunk  Transfers Overall transfer level: Needs assistance   Transfers: Sit to/from Stand Sit to Stand: Supervision         General transfer comment: cues for hand placement to not push from surface  Ambulation/Gait Ambulation/Gait assistance: Supervision Ambulation Distance (Feet): 220 Feet Assistive device: None Gait Pattern/deviations: Step-through pattern;Decreased stride length   Gait velocity interpretation: Below normal speed for age/gender General Gait Details: pt with good speed, balance and endurance on initiation of gait but started reaching for environmental support and decreased speed for 2nd half of gait. Encouraged continued RW use for energy conservation at this time particularly if walking  alone   Stairs Stairs: Yes Stairs assistance: Min guard Stair Management: Step to pattern;Forwards;One rail Right Number of Stairs: 5 General stair comments: cues for sequence  Wheelchair Mobility    Modified Rankin (Stroke Patients Only)       Balance                                    Cognition Arousal/Alertness: Awake/alert Behavior During Therapy: WFL for tasks assessed/performed Overall Cognitive Status: Within Functional Limits for tasks assessed                      Exercises      General Comments        Pertinent Vitals/Pain Pain Score: 4  Pain Location: left leg Pain Descriptors / Indicators: Throbbing Pain Intervention(s): Limited activity within patient's tolerance;Repositioned   HR 89-105 with activity, sats 98% on RA, no SOB    Home Living                      Prior Function            PT Goals (current goals can now be found in the care plan section) Progress towards PT goals: Goals met and updated - see care plan    Frequency       PT Plan Current plan remains appropriate    Co-evaluation             End of Session   Activity Tolerance: Patient tolerated treatment well Patient left: in chair;with call  bell/phone within reach     Time: 1306-1322 PT Time Calculation (min) (ACUTE ONLY): 16 min  Charges:  $Gait Training: 8-22 mins                    G Codes:      Melford Aase 11-27-15, 1:28 PM Elwyn Reach, [place]

## 2015-11-17 NOTE — Progress Notes (Signed)
CARDIAC REHAB PHASE I   PRE:  Rate/Rhythm: 87 SR    BP: sitting 105/68    SaO2: 91 RA  MODE:  Ambulation: 300 ft   POST:  Rate/Rhythm: 97 SR    BP: sitting 121/62 after being wheeled back to room, 114/65 after walking to recliner, 108/60 resting     SaO2: 92 RA  Pt feeling much improved and wanted to walk without RW. She is slightly unsteady due to left leg "heaviness" that is improving since surgery. She hesitates on placement of foot when walking. Pt had walked 280 ft when she suddenly felt more tired, dizzy, SOB, and c/o chest heaviness. She attempted to keep walking but seemed less aware/responsive so we had her sit down in hall. Pt was able to follow commands (like sitting, standing to change chairs in hall, scooting back) and answer questions. As she was pushed back to room, she only nodded to questions. BP once in room was 121/62, SaO2 and HR stable. Pt began coming to and was able to walk to recliner after 4-5 min. She slowly felt more like herself. Later she stated she did not remember any of the event except the initial "getting tired".  CBG 97  Ed completed with pt and daughter. Pt needs work on her diet. Good reception. Sending referral to [facility]. Pt would benefit from more PT for balance and maybe stairs. She would like RW for home and HHPT. [id number]  [place], ACSM 11/17/2015 10:42 AM

## 2015-11-17 NOTE — Progress Notes (Addendum)
[address]       [place] [address]             [phone]      7 Days Post-Op Procedure(s) (LRB): CORONARY ARTERY BYPASS GRAFTING (CABG), ON PUMP, TIMES FOUR, USING LEFT INTERNAL MAMMARY ARTERY, BILATERAL GREATER SAPHENOUS VEINS HARVESTED ENDOSCOPICALLY (N/A) TRANSESOPHAGEAL ECHOCARDIOGRAM (TEE) (N/A) Subjective: conts to feel better  Objective: Vital signs in last 24 hours: Temp:  [98.1 F (36.7 C)-98.2 F (36.8 C)] 98.1 F (36.7 C) (08/21 0514) Pulse Rate:  [82-100] 85 (08/21 0514) Cardiac Rhythm: Normal sinus rhythm (08/20 1900) Resp:  [18] 18 (08/21 0514) BP: (123-160)/(67-86) 160/84 (08/21 0514) SpO2:  [92 %-98 %] 93 % (08/21 0514) Weight:  [186 lb 4.8 oz (84.5 kg)] 186 lb 4.8 oz (84.5 kg) (08/21 0514)  Hemodynamic parameters for last 24 hours:    Intake/Output from previous day: 08/20 0701 - 08/21 0700 In: 480 [P.O.:480] Out: 1350 [Urine:1350] Intake/Output this shift: No intake/output data recorded.  General appearance: alert, cooperative and no distress Heart: regular rate and rhythm Lungs: sl coarse this am, no wheeze Abdomen: benign Extremities: + minor edema Wound: incis healing well  Lab Results:  Recent Labs  11/15/15 0232  WBC 10.3  HGB 8.2*  HCT 25.0*  PLT 173   BMET:  Recent Labs  11/15/15 0232  NA 137  K 3.9  CL 101  CO2 26  GLUCOSE 146*  BUN 14  CREATININE 0.84  CALCIUM 9.0    PT/INR: No results for input(s): LABPROT, INR in the last 72 hours. ABG    Component Value Date/Time   PHART 7.348 (L) 11/11/2015 0102   HCO3 26.4 (H) 11/11/2015 0102   TCO2 26 11/11/2015 1709   O2SAT 95.0 11/11/2015 0102   CBG (last 3)   Recent Labs  11/16/15 1612 11/16/15 2118 11/17/15 0626  GLUCAP 225* 129* 71    Meds Scheduled Meds: . acetylcysteine  2 mL Nebulization TID  . aspirin EC  325 mg Oral Daily  . docusate sodium  200 mg Oral Daily  . enoxaparin (LOVENOX) injection  40 mg Subcutaneous QHS  .  ferrous Q000111Q C-folic acid  1 capsule Oral TID PC  . furosemide  40 mg Oral Daily  . glipiZIDE  10 mg Oral Q breakfast  . glipiZIDE  5 mg Oral Q supper  . guaiFENesin  600 mg Oral BID  . insulin aspart  0-24 Units Subcutaneous TID AC & HS  . ipratropium  0.5 mg Nebulization TID  . lactulose  20 g Oral Daily  . levalbuterol  0.63 mg Nebulization TID  . linagliptin  5 mg Oral Daily  . lisinopril  2.5 mg Oral Daily  . metFORMIN  750 mg Oral BID WC  . metoprolol tartrate  25 mg Oral BID  . pantoprazole  40 mg Oral QAC breakfast  . potassium chloride  20 mEq Oral Daily  . predniSONE  30 mg Oral Q breakfast  . rosuvastatin  40 mg Oral q1800  . sertraline  50 mg Oral Daily  . sodium chloride flush  3 mL Intravenous Q12H   Continuous Infusions:  PRN Meds:.sodium chloride, bisacodyl **OR** bisacodyl, levalbuterol, ondansetron **OR** ondansetron (ZOFRAN) IV, oxyCODONE, sodium chloride flush, traMADol  Xrays No results found.  Assessment/Plan: S/P Procedure(s) (LRB): CORONARY ARTERY BYPASS GRAFTING (CABG), ON PUMP, TIMES FOUR, USING LEFT INTERNAL MAMMARY ARTERY, BILATERAL GREATER SAPHENOUS VEINS HARVESTED ENDOSCOPICALLY (N/A) TRANSESOPHAGEAL ECHOCARDIOGRAM (TEE) (N/A)  1 steady progress ,  pulm status improving - CCM to re-eval patient this am and have plan for meds/follow-up.  2 will come back later to remove pacer wire. 3 cont diuresis 5 htn- will increase lisinopril dose   LOS: 10 days    GOLD,WAYNE E 11/17/2015   addundum- pacer wire removed without difficulty.  Addendum: patient had event where she 'nearly  blacked out' while ambulating . Has had similar event in past , where she has not had recollection of events, like driving from one place to another. Has had CVA in past- will see if neurology thinks it would be appropriate to see patient.  CT surgery assessment  Patient comfortable without incisional pain, surgical incisions clean and dry Ambulating  without difficulty and hallway Pulmonary symptoms have resolved with prednisone taper Patient appears ready for discharge after assessment by neurology for recently described symptoms of potential temporal lobe sz   Tharon Aquas trigt M.D.

## 2015-11-17 NOTE — Discharge Instructions (Signed)
Coronary Artery Bypass Grafting, Care After °These instructions give you information on caring for yourself after your procedure. Your doctor may also give you more specific instructions. Call your doctor if you have any problems or questions after your procedure.  °HOME CARE °· Only take medicine as told by your doctor. Take medicines exactly as told. Do not stop taking medicines or start any new medicines without talking to your doctor first. °· Take your pulse as told by your doctor. °· Do deep breathing as told by your doctor. Use your breathing device (incentive spirometer), if given, to practice deep breathing several times a day. Support your chest with a pillow or your arms when you take deep breaths or cough. °· Keep the area clean, dry, and protected where the surgery cuts (incisions) were made. Remove bandages (dressings) only as told by your doctor. If strips were applied to surgical area, do not take them off. They fall off on their own. °· Check the surgery area daily for puffiness (swelling), redness, or leaking fluid. °· If surgery cuts were made in your legs: °¨ Avoid crossing your legs. °¨ Avoid sitting for long periods of time. Change positions every 30 minutes. °¨ Raise your legs when you are sitting. Place them on pillows. °· Wear stockings that help keep blood clots from forming in your legs (compression stockings). °· Only take sponge baths until your doctor says it is okay to take showers. Pat the surgery area dry. Do not rub the surgery area with a washcloth or towel. Do not bathe, swim, or use a hot tub until your doctor says it is okay. °· Eat foods that are high in fiber. These include raw fruits and vegetables, whole grains, beans, and nuts. Choose lean meats. Avoid canned, processed, and fried foods. °· Drink enough fluids to keep your pee (urine) clear or pale yellow. °· Weigh yourself every day. °· Rest and limit activity as told by your doctor. You may be told to: °¨ Stop any  activity if you have chest pain, shortness of breath, changes in heartbeat, or dizziness. Get help right away if this happens. °¨ Move around often for short amounts of time or take short walks as told by your doctor. Gradually become more active. You may need help to strengthen your muscles and build endurance. °¨ Avoid lifting, pushing, or pulling anything heavier than 10 pounds (4.5 kg) for at least 6 weeks after surgery. °· Do not drive until your doctor says it is okay. °· Ask your doctor when you can go back to work. °· Ask your doctor when you can begin sexual activity again. °· Follow up with your doctor as told. °GET HELP IF: °· You have puffiness, redness, more pain, or fluid draining from the incision site. °· You have a fever. °· You have puffiness in your ankles or legs. °· You have pain in your legs. °· You gain 2 or more pounds (0.9 kg) a day. °· You feel sick to your stomach (nauseous) or throw up (vomit). °· You have watery poop (diarrhea). °GET HELP RIGHT AWAY IF: °· You have chest pain that goes to your jaw or arms. °· You have shortness of breath. °· You have a fast or irregular heartbeat. °· You notice a "clicking" in your breastbone when you move. °· You have numbness or weakness in your arms or legs. °· You feel dizzy or light-headed. °MAKE SURE YOU: °· Understand these instructions. °· Will watch your condition. °· Will get   help right away if you are not doing well or get worse. °  °This information is not intended to replace advice given to you by your health care provider. Make sure you discuss any questions you have with your health care provider. °  °Document Released: 03/20/2013 Document Reviewed: 03/20/2013 °Elsevier Interactive Patient Education ©2016 [employer] ° °

## 2015-11-17 NOTE — Discharge Summary (Signed)
Physician Discharge Summary  Patient ID: Candace [name] MRN: [id number] DOB/AGE: January 04, 1951 65 y.o.  Admit date: 11/07/2015 Discharge date: 11/27/2015  Admission Diagnoses:Severe coronary artery disease/unstable angina  Discharge Diagnoses:  Active Problems:   Arteriosclerosis of coronary artery   CAD (coronary artery disease)   S/P CABG x 4   COPD suggested by initial evaluation [facility])   Patient Active Problem List   Diagnosis Date Noted  . COPD suggested by initial evaluation ([place]) 11/11/2015  . S/P CABG x 4 11/10/2015  . CAD (coronary artery disease) 11/07/2015  . Benign essential HTN 10/28/2014  . Episode of syncope 10/25/2014  . Carotid artery narrowing 02/08/2014  . Chest pain 02/08/2014  . Combined fat and carbohydrate induced hyperlipemia 02/08/2014  . Atypical chest pain 02/07/2014  . Arteriosclerosis of coronary artery 02/07/2014  . Essential (primary) hypertension 02/07/2014  . Awareness of heartbeats 02/07/2014  . Abdominal pain 10/05/2013  . D (diarrhea) 10/05/2013  . Acid reflux 10/05/2013      History of the present illness at time of transfer from [facility]: Subjective:  The patient is a 65 year old female recently admitted to [facility]  On 11/05/2015 where she presented with persistent chest pain. A cardiac catheterization was done which revealed significant three-vessel disease and cardiology has recommended CABG. Patient had previous cardiac caths 2011 and 2014, with some  Progression of disease .  She is being transferred to [facility] for surgical opinion and potential procedure arriving 5 pm 11/07/2015 . She has multiple cardiac risk factors including diabetes, hypertension, hyperlipidemia, and previous CVA.She presented on 11/05/2015 with chest pain that was substernal and nonradiating. She did have some associated nausea and diaphoresis. Intensity was measured at 8 out of 10. It began when she was at work. She stated she was  seeing black 5 seconds in the front arise and then became dizzy and lightheaded but did not have a full syncopal episode. Shortly after this she developed a chest pain as described. She then went to the emergency department and was found to have an EKG was subtle T-wave inversions in the lateral leads and was felt to require further evaluation and treatment so she was admitted. She was seen by Dr. Nehemiah Massed in cardiology consultation.She was placed on beta blocker Crestor and aspirin and results of cardiac catheterization are noted below.On arrival here the patient states that she is having 8/10 chest pain and was given nitroglycerin and fluid bolus in the ambulance. She also reports that she has had intermittent chest pains since arrival at [facility] and he has received intermittent sublingual nitroglycerin for this. She is now slightly improved with pain at 5/10. She also reports that she has had palpitations, shortness of breath, DOE, orthopnea, and generalized fatigue. She had some diaphoresis on presentation at [facility] but is not having any kind of sweating currently.  Discharged Condition: good  Hospital Course: The patient was admitted through the emergency department at [facility]. She was seen in cardiology consultation by Dr. Nehemiah Massed admitted for further evaluation and treatment. She was medically stabilized and underwent cardiac catheterization where she was found to have severe multivessel coronary artery disease. She was transferred to [facility] for further treatment to include cardiothoracic surgical evaluation for CABG. On 11/10/2015 she was taken to the operating room where she underwent the below described procedure. She tolerated well was taken to the SICU in stable condition.  Postoperative hospital course:  Overall the patient has progressed nicely. Due to her  preoperative pulmonary findings we did ask critical care medicine to assist with postoperative  pulmonary management. She did respond well to this treatment and was weaned from the ventilator without significant difficulty. Additionally followed postoperatively to assist with nebulizers and ongoing management to include plans for outpatient follow-up. She has maintained sinus rhythm. She did have an expected acute blood loss anemia and value stabilized. She did require some diuresis for volume overload. Diabetes has been under good control using standard measures. She did have some postoperative nausea but this resolved over time using standard measures. Physical therapy has assisted with mobilization and increasing activity tolerance. She did have a single event where she got a little dizzy and some mental status changes. She has a previous history of some early dementia type symptoms. An EEG was done which showed some nonspecific findings that would require follow-up with neurology. Overall her status is felt to be stable at time of discharge.  Consults: pulmonary/intensive care  Significant Diagnostic Studies: PFT's, EEG  Treatments: surgery:  DATE OF PROCEDURE:  11/10/2015 DATE OF DISCHARGE:                              OPERATIVE REPORT   PREOPERATIVE DIAGNOSIS:  Coronary occlusive disease with unstable angina.  POSTOPERATIVE DIAGNOSIS:  Coronary occlusive disease with unstable angina.  SURGICAL PROCEDURE:  Coronary artery bypass grafting x4 with the left internal mammary to the left anterior descending coronary artery, sequential reverse saphenous vein graft to the first and second obtuse marginal, reverse saphenous vein graft to the continuation branch of the right coronary artery with bilateral right and left greater saphenous vein harvesting.  SURGEON:  Lanelle Bal, MD.  FIRST ASSISTANT:  John Giovanni, P.A.-C. and Nicholes Rough  PA.     Discharge Exam: Blood pressure 112/70, pulse 81, temperature 97.9 F (36.6 C), temperature source Oral, resp. rate 18,  height 5\' 3"  (1.6 m), weight 176 lb 8 oz (80.1 kg), SpO2 98 %.  General appearance: alert, cooperative and no distress Heart: regular rate and rhythm Lungs: clear to auscultation bilaterally Abdomen: benign Extremities: minor edema Wound: incis healing well  Disposition: 01-Home or Self Care  Discharge Instructions    Amb Referral to Cardiac Rehabilitation    Complete by:  As directed   Diagnosis:  CABG   CABG X ___:  4       Medication List    STOP taking these medications   aspirin-acetaminophen-caffeine 250-250-65 MG tablet Commonly known as:  EXCEDRIN MIGRAINE   ondansetron 4 MG tablet Commonly known as:  ZOFRAN     TAKE these medications   aspirin 325 MG EC tablet Take 1 tablet (325 mg total) by mouth daily.   cetirizine 10 MG tablet Commonly known as:  ZYRTEC Take 10 mg by mouth daily.   ferrous Q000111Q C-folic acid capsule Commonly known as:  TRINSICON / FOLTRIN Take 1 capsule by mouth 3 (three) times daily after meals.   glipiZIDE 10 MG tablet Commonly known as:  GLUCOTROL Take 5-10 mg by mouth 2 (two) times daily. Pt takes one tablet in the morning and one-half tablet at bedtime.   lisinopril 10 MG tablet Commonly known as:  PRINIVIL,ZESTRIL Take 1 tablet (10 mg total) by mouth daily. What changed:  medication strength  how much to take   metFORMIN 750 MG 24 hr tablet Commonly known as:  GLUCOPHAGE-XR Take 1 tablet (750 mg total) by mouth 2 (two) times  daily. What changed:  medication strength  how much to take   metoprolol tartrate 25 MG tablet Commonly known as:  LOPRESSOR Take 1 tablet (25 mg total) by mouth 2 (two) times daily. What changed:  medication strength  how much to take   oxyCODONE 5 MG immediate release tablet Commonly known as:  Oxy IR/ROXICODONE Take 1 tablet (5 mg total) by mouth every 6 (six) hours as needed for severe pain.   pantoprazole 40 MG tablet Commonly known as:  PROTONIX Take 40 mg by mouth  daily.   rosuvastatin 40 MG tablet Commonly known as:  CRESTOR Take 1 tablet (40 mg total) by mouth daily at 6 PM. What changed:  medication strength  how much to take  when to take this   sertraline 50 MG tablet Commonly known as:  ZOLOFT Take 50 mg by mouth daily.   TRADJENTA 5 MG Tabs tablet Generic drug:  linagliptin Take 5 mg by mouth daily.      Follow-up Information    [name], MD .   Specialty:  Cardiothoracic Surgery Why:  Appointment to see the surgeon on 12/11/2015 at 12:30 PM. Please obtain a chest x-ray at [place] at 12 noon. [facility] is located in the same office complex on the first floor. Contact information: [address] [place] [address] [phone]        Corey Skains, MD .   Specialty:  Cardiology Why:  Please contact cardiology office to arrange a 2 week follow-up appointment with cardiologist Contact information: [facility] [place] [address] [phone]        Cameron Sprang, MD .   Specialty:  Neurology Why:  call to arrange a neurology follow-up appointment Contact information: [address] [place] [address] MD:[phone]        Rigoberto Noel., MD .   Specialty:  Pulmonary Disease Why:  12/19/15 at 9:30 am Contact information: [address] [place] [address] [phone]        Inc. - Dme [facility] .   Why:  rolling walker and 3n1 arranged- to be delivered to room prior to discharge Contact information: [address] [place] [address] [phone]        [facility] .   Why:  HHPT arranged Contact information: [address] [place]  [address] [phone]          The patient has been discharged on:   1.Beta Blocker:  Yes [ x  ]                              No   [   ]                              If No, reason:  2.Ace Inhibitor/ARB:  Yes [  x ]                                     No  [    ]                                     If  No, reason:  3.Statin:   Yes [x   ]                  No  [   ]                  If No, reason:  4.Ecasa:  Yes  [   x]                  No   [   ]                  If No, reason:  Signed: GOLD,WAYNE E 11/27/2015, 9:36 AM

## 2015-11-17 NOTE — Progress Notes (Signed)
PULMONARY / CRITICAL CARE MEDICINE   Name: Candace [name] MRN: [id number] DOB: [birth year]    ADMISSION DATE:  11/07/2015 CONSULTATION DATE:  8/14  REFERRING MD:  Candace [name]   CHIEF COMPLAINT:  Chronic respiratory failure and COPD   HISTORY OF PRESENT ILLNESS:   65 year old female w/ known h/o CAD. Transferred from [facility] after newly discovered severe 3V disease by cardiac cath. Pre-op Went for CABG 8/14.  Came back extubated.  Poor PFTs and anticipated will be difficult to wean to PCCM was consulted.   SUBJECTIVE:  Breathing feels better.    VITAL SIGNS: BP (!) 160/84 (BP Location: Left Arm)   Pulse 85   Temp 98.1 F (36.7 C) (Oral)   Resp 18   Ht 5\' 3"  (1.6 m)   Wt 186 lb 4.8 oz (84.5 kg)   SpO2 93%   BMI 33.00 kg/m  Room air   INTAKE / OUTPUT: I/O last 3 completed shifts: In: 720 [P.O.:720] Out: 2625 [Urine:2625]  PHYSICAL EXAMINATION: General: lying in bed,  Having EKG, no distress Neuro:  Alert, appropriate, MAE  HEENT:  [name]/AT, PERRL,no jvd Cardiovascular:  RRR, Nl S1/S2, -M/R/G. Lungs:  resps even non labored on [name], clear on auscultation  Abdomen:  Soft, NT, ND and +BS. Musculoskeletal: no-edema and -tenderness. Skin:  Intact.  LABS:  BMET  Recent Labs Lab 11/13/15 0317 11/14/15 0508 11/15/15 0232  NA 139 137 137  K 3.8 3.8 3.9  CL 104 101 101  CO2 25 26 26   BUN 13 13 14   CREATININE 0.90 0.81 0.84  GLUCOSE 96 96 146*    Electrolytes  Recent Labs Lab 11/10/15 2140 11/11/15 0415 11/11/15 1700  11/13/15 0317 11/14/15 0508 11/15/15 0232  CALCIUM  --  8.5*  --   < > 8.6* 8.6* 9.0  MG 2.6* 1.9 1.8  --   --   --   --   < > = values in this interval not displayed.  CBC  Recent Labs Lab 11/13/15 0317 11/14/15 0508 11/15/15 0232  WBC 13.0* 11.5* 10.3  HGB 8.5* 8.1* 8.2*  HCT 25.9* 25.9* 25.0*  PLT 120* 132* 173    Coag's  Recent Labs Lab 11/10/15 1610  APTT 37*  INR 1.44    Sepsis Markers No results for input(s):  LATICACIDVEN, PROCALCITON, O2SATVEN in the last 168 hours.  ABG  Recent Labs Lab 11/10/15 2211 11/10/15 2345 11/11/15 0102  PHART 7.335* 7.434 7.348*  PCO2ART 51.8* 37.2 48.3*  PO2ART 428.0* 66.0* 80.0    Liver Enzymes No results for input(s): AST, ALT, ALKPHOS, BILITOT, ALBUMIN in the last 168 hours.  Cardiac Enzymes No results for input(s): TROPONINI, PROBNP in the last 168 hours.  Glucose  Recent Labs Lab 11/16/15 0035 11/16/15 0654 11/16/15 1130 11/16/15 1612 11/16/15 2118 11/17/15 0626  GLUCAP 137* 73 113* 225* 129* 71    Imaging No results found.   STUDIES:    CULTURES: None  ANTIBIOTICS:   SIGNIFICANT EVENTS: 8/14 CABG  LINES/TUBES: ETT 8/14>>>8/14 L radial a-line 8/14>>> out R IJ PAC 8/14>>> out Foley 8/14>>>  DISCUSSION: 65 year old female with extensive cardiopulmonary history presenting to PCCM for VDRF post CABG -extubated  PFT's noted, - mod restriction, ratio 76 , remote smoker  ASSESSMENT / PLAN:   Chronic respiratory failure  Treat as COPD - although PFTs s/o restriction P:   Send her home on xopenex HFA 2 puffs 4xd as needed  Taper steroids over 5d Continue diuresis as able  Out-pt pulmonary f/u Sept 22 w/ Dr Elsworth [name] at 56 am  May need to consider repeat PFTs then further CT imaging to better define restrictive component of still present   3 V CAD now s/p CABG 8/14 Hyperlipidemia   P:  Post-op per CVTS   ENDOCRINE A:   Diabetes  P:   SSI   Candace [name] ACNP-BC [place] Pager # [phone] OR # [phone] if no answer  Attending Note:  65 year old female with PMH above presenting with acute on chronic respiratory failure with COPD.  On exam, BS are distant.  I reviewed CXR myself, no acute disease noted.  Discussed with PCCM-NP.  Acute on chronic respiratory failure post OP:  - Diureses.  - Treat COPD as below.  COPD:  - Continue bronchodilators as ordered.  - F/U with Dr. Elsworth [name] 9/22 at  9:30 AM.  Hypoxemia:  - Titrate O2 for sat of 88-92%.  - Anticipate no need for home O2.  PCCM will sign off, please call back if needed.  Patient seen and examined, agree with above note.  I dictated the care and orders written for this patient under my direction.  Candace Farmer, MD [phone]

## 2015-11-18 LAB — GLUCOSE, CAPILLARY
Glucose-Capillary: 103 mg/dL — ABNORMAL HIGH (ref 65–99)
Glucose-Capillary: 58 mg/dL — ABNORMAL LOW (ref 65–99)
Glucose-Capillary: 284 mg/dL — ABNORMAL HIGH (ref 65–99)

## 2015-11-18 MED ORDER — METOPROLOL TARTRATE 25 MG PO TABS: 25.0000 mg | ORAL_TABLET | Freq: Two times a day (BID) | ORAL | 1 refills | Status: DC

## 2015-11-18 MED ORDER — FE FUMARATE-B12-VIT C-FA-IFC PO CAPS: 1.0000 | ORAL_CAPSULE | Freq: Three times a day (TID) | ORAL | 1 refills | Status: DC

## 2015-11-18 MED ORDER — ASPIRIN 325 MG PO TBEC: 325.0000 mg | DELAYED_RELEASE_TABLET | Freq: Every day | ORAL | Status: DC

## 2015-11-18 MED ORDER — ROSUVASTATIN CALCIUM 40 MG PO TABS: 40.0000 mg | ORAL_TABLET | Freq: Every day | ORAL | 1 refills | Status: DC

## 2015-11-18 MED ORDER — LISINOPRIL 10 MG PO TABS: 10.0000 mg | ORAL_TABLET | Freq: Every day | ORAL | 1 refills | Status: DC

## 2015-11-18 MED ORDER — OXYCODONE HCL 5 MG PO TABS: 5.0000 mg | ORAL_TABLET | Freq: Four times a day (QID) | ORAL | 0 refills | Status: DC | PRN

## 2015-11-18 MED ORDER — METFORMIN HCL ER 750 MG PO TB24: 750.0000 mg | ORAL_TABLET | Freq: Two times a day (BID) | ORAL | 1 refills | Status: DC

## 2015-11-18 MED ORDER — PREDNISONE 10 MG PO TABS: ORAL_TABLET | ORAL | 0 refills | Status: DC

## 2015-11-18 NOTE — Progress Notes (Signed)
Hypoglycemic Event  CBG: 58  Treatment: 15 GM carbohydrate snack  Symptoms: Sweaty  Follow-up CBG: Time: 0700 CBG Result:103  Possible Reasons for Event: Unknown    Candace [name]

## 2015-11-18 NOTE — Care Management Note (Signed)
Case Management Note Marvetta Gibbons RN, BSN Unit 2W-Case Manager [phone]  Patient Details  Name: Candace [name] MRN: [id number] Date of Birth: 1950/08/03  Subjective/Objective:     Pt admitted s/p CABGx2- tx from ICU to 2W on 8/16- CM to follow               Action/Plan: PTA pt lived at home- PT eval ordered- recommendation for [facility]- will need orders prior to discharge- CM will follow  Expected Discharge Date:    11/18/15              Expected Discharge Plan:  [place]  In-House Referral:     Discharge planning Services  CM Consult  Post Acute Care Choice:  Durable Medical Equipment, Home Health Choice offered to:  Patient, Adult Children  DME Arranged:  3-N-1, [facility] rolling DME Agency:  [place]:  PT [facility] Agency:  [place]  Status of Service:  Completed, signed off  If discussed at [place] of Stay Meetings, dates discussed:    Additional Comments:  11/18/15- 1000- Geovani Tootle RN, CM- pt for discharge today- HHPT order placed- in to speak with pt and daughter at bedside- choice offered for [facility] in [facility]- however pt going to stay at daughter's home in [place] for the next 2 wks- address [address] [place] [address]- daughter cell # [phone] home # [phone] Per pt's choice she would like to try [facility] for [facility] services- referral made however they are not in-network with pt's insurance- pt's next choice was [facility] followed by [facility] if [facility] cannot provide services- referral faxed to [facility]- call received from [place] there who states that they can not provide staffing for referral at this time- referral has now been called to [place] with [facility] for HHPT- referral accepted- Manuela Schwartz aware that pt will be staying with daughter and address given- call made to pt's daughter to make her aware that [facility] services will be done with [facility].   Dawayne Patricia, RN 11/18/2015, 3:30 PM

## 2015-11-18 NOTE — Progress Notes (Signed)
      [address]       [place] [address]             [phone]      8 Days Post-Op Procedure(s) (LRB): CORONARY ARTERY BYPASS GRAFTING (CABG), ON PUMP, TIMES FOUR, USING LEFT INTERNAL MAMMARY ARTERY, BILATERAL GREATER SAPHENOUS VEINS HARVESTED ENDOSCOPICALLY (N/A) TRANSESOPHAGEAL ECHOCARDIOGRAM (TEE) (N/A) Subjective: Remains stable, EEG abnormal but no seizure activity  Objective: Vital signs in last 24 hours: Temp:  [97.7 F (36.5 C)-98 F (36.7 C)] 97.9 F (36.6 C) (08/22 0405) Pulse Rate:  [81-95] 81 (08/22 0405) Cardiac Rhythm: Normal sinus rhythm (08/21 1901) Resp:  [16-18] 18 (08/22 0405) BP: (106-121)/(62-71) 112/70 (08/22 0405) SpO2:  [90 %-96 %] 94 % (08/22 0405) Weight:  [176 lb 8 oz (80.1 kg)] 176 lb 8 oz (80.1 kg) (08/22 0405)  Hemodynamic parameters for last 24 hours:    Intake/Output from previous day: 08/21 0701 - 08/22 0700 In: 483 [P.O.:480; I.V.:3] Out: 900 [Urine:900] Intake/Output this shift: No intake/output data recorded.  General appearance: alert, cooperative and no distress Heart: regular rate and rhythm Lungs: clear to auscultation bilaterally Abdomen: benign Extremities: minor edema Wound: incis healing well  Lab Results: No results for input(s): WBC, HGB, HCT, PLT in the last 72 hours. BMET: No results for input(s): NA, K, CL, CO2, GLUCOSE, BUN, CREATININE, CALCIUM in the last 72 hours.  PT/INR: No results for input(s): LABPROT, INR in the last 72 hours. ABG    Component Value Date/Time   PHART 7.348 (L) 11/11/2015 0102   HCO3 26.4 (H) 11/11/2015 0102   TCO2 26 11/11/2015 1709   O2SAT 95.0 11/11/2015 0102   CBG (last 3)   Recent Labs  11/17/15 2042 11/18/15 0635 11/18/15 0702  GLUCAP 201* 58* 103*    Meds Scheduled Meds: . aspirin EC  325 mg Oral Daily  . docusate sodium  200 mg Oral Daily  . enoxaparin (LOVENOX) injection  40 mg Subcutaneous QHS  . ferrous Q000111Q C-folic acid  1  capsule Oral TID PC  . glipiZIDE  10 mg Oral Q breakfast  . glipiZIDE  5 mg Oral Q supper  . guaiFENesin  600 mg Oral BID  . insulin aspart  0-24 Units Subcutaneous TID AC & HS  . ipratropium  0.5 mg Nebulization TID  . lactulose  20 g Oral Daily  . levalbuterol  0.63 mg Nebulization TID  . linagliptin  5 mg Oral Daily  . lisinopril  10 mg Oral Daily  . metFORMIN  750 mg Oral BID WC  . metoprolol tartrate  25 mg Oral BID  . pantoprazole  40 mg Oral QAC breakfast  . potassium chloride  20 mEq Oral Daily  . predniSONE  30 mg Oral Q breakfast  . rosuvastatin  40 mg Oral q1800  . sertraline  50 mg Oral Daily  . sodium chloride flush  3 mL Intravenous Q12H   Continuous Infusions:  PRN Meds:.sodium chloride, bisacodyl **OR** bisacodyl, levalbuterol, ondansetron **OR** ondansetron (ZOFRAN) IV, oxyCODONE, sodium chloride flush, traMADol  Xrays No results found.  Assessment/Plan: S/P Procedure(s) (LRB): CORONARY ARTERY BYPASS GRAFTING (CABG), ON PUMP, TIMES FOUR, USING LEFT INTERNAL MAMMARY ARTERY, BILATERAL GREATER SAPHENOUS VEINS HARVESTED ENDOSCOPICALLY (N/A) TRANSESOPHAGEAL ECHOCARDIOGRAM (TEE) (N/A)  1 doing well 2 will need neuro w/u as out patient- instructed not to drive till workup done 3 discharge   LOS: 11 days    Candace [name] E 11/18/2015

## 2015-11-19 DIAGNOSIS — Z48812 Encounter for surgical aftercare following surgery on the circulatory system: Secondary | ICD-10-CM | POA: Diagnosis not present

## 2015-11-26 ENCOUNTER — Encounter: Payer: Self-pay | Admitting: Cardiothoracic Surgery

## 2015-11-26 ENCOUNTER — Ambulatory Visit (INDEPENDENT_AMBULATORY_CARE_PROVIDER_SITE_OTHER): Payer: Self-pay | Admitting: Cardiothoracic Surgery

## 2015-11-26 VITALS — BP 120/83 | HR 98 | Temp 98.5°F | Resp 20 | Ht 63.0 in | Wt 173.0 lb

## 2015-11-26 DIAGNOSIS — Z951 Presence of aortocoronary bypass graft: Secondary | ICD-10-CM

## 2015-11-26 NOTE — Progress Notes (Signed)
[address]       [place] [address]             [phone]      Oliana W Welle [place] Medical Record [id number] Date of Birth: [birth year]  Referring: [place], [place]: [facility]  Chief Complaint:   POST OP FOLLOW UP  History of Present Illness:     11/10/2015 OPERATIVE REPORT PREOPERATIVE DIAGNOSIS:  Coronary occlusive disease with unstable angina. POSTOPERATIVE DIAGNOSIS:  Coronary occlusive disease with unstable angina. SURGICAL PROCEDURE:  Coronary artery bypass grafting x4 with the left internal mammary to the left anterior descending coronary artery, sequential reverse saphenous vein graft to the first and second obtuse marginal, reverse saphenous vein graft to the continuation branch of the right coronary artery with bilateral right and left greater saphenous vein harvesting. SURGEON:  Lanelle Bal, MD.  Patient returns office today after coronary artery bypass grafting done on August 14. She's been making reasonable progress at home with some complaint of poor appetite and difficulty sleeping. She denies any fevers. She is increasing her physical activity. She comes in the office today because of some separation of the lower sternal incision without significant drainage. She's had no popping or clicking of the sternum.  She notes that her diabetes has been recently well controlled at home, none have been recorded over 200.    Past Medical History:  Diagnosis Date  . Anxiety   . Arrhythmia   . Arthritis   . Diabetes mellitus without complication ([place])   . Hyperlipidemia   . Hypertension   . Reflux   . Stroke [facility])      History  Smoking Status  . Former Smoker  . Types: Cigarettes  . Quit date: 10/07/2001  Smokeless Tobacco  . Never Used    History  Alcohol Use No     Allergies  Allergen Reactions  . Penicillins Other (See Comments)    UNSPECIFIED     Current Outpatient  Prescriptions  Medication Sig Dispense Refill  . aspirin EC 325 MG EC tablet Take 1 tablet (325 mg total) by mouth daily.    . cetirizine (ZYRTEC) 10 MG tablet Take 10 mg by mouth daily.    . ferrous Q000111Q C-folic acid (TRINSICON / FOLTRIN) capsule Take 1 capsule by mouth 3 (three) times daily after meals. 90 capsule 1  . glipiZIDE (GLUCOTROL) 10 MG tablet Take 5-10 mg by mouth 2 (two) times daily. Pt takes one tablet in the morning and one-half tablet at bedtime.    Marland Kitchen linagliptin (TRADJENTA) 5 MG TABS tablet Take 5 mg by mouth daily.    Marland Kitchen lisinopril (PRINIVIL,ZESTRIL) 10 MG tablet Take 1 tablet (10 mg total) by mouth daily. 30 tablet 1  . metFORMIN (GLUCOPHAGE-XR) 750 MG 24 hr tablet Take 1 tablet (750 mg total) by mouth 2 (two) times daily. 60 tablet 1  . metoprolol tartrate (LOPRESSOR) 25 MG tablet Take 1 tablet (25 mg total) by mouth 2 (two) times daily. 30 tablet 1  . oxyCODONE (OXY IR/ROXICODONE) 5 MG immediate release tablet Take 1 tablet (5 mg total) by mouth every 6 (six) hours as needed for severe pain. 30 tablet 0  . rosuvastatin (CRESTOR) 40 MG tablet Take 1 tablet (40 mg total) by mouth daily at 6 PM. 30 tablet 1  . sertraline (ZOLOFT) 50 MG tablet Take 50 mg by mouth daily.    . pantoprazole (PROTONIX) 40 MG tablet Take  40 mg by mouth daily.     No current facility-administered medications for this visit.        Physical Exam: BP 120/83 (BP Location: Left Arm, Patient Position: Sitting, Cuff Size: Normal)   Pulse 98   Temp 98.5 F (36.9 C) (Oral)   Resp 20   Ht 5\' 3"  (1.6 m)   Wt 173 lb (78.5 kg)   SpO2 97% Comment: RA  BMI 30.65 kg/m   General appearance: alert and cooperative Neurologic: intact Heart: regular rate and rhythm, S1, S2 normal, no murmur, click, rub or gallop Lungs: clear to auscultation bilaterally Abdomen: soft, non-tender; bowel sounds normal; no masses,  no organomegaly Extremities: extremities normal, atraumatic, no cyanosis or  edema and Homans sign is negative, no sign of DVT Wound: Has superficial separation with slight skin necrosis at the lower end of her sternal incision the remainder of the incision is intact the sternum is stable   Photo upside down  Diagnostic Studies & Laboratory data:     Recent Radiology Findings:   No results found.    Recent Lab Findings: Lab Results  Component Value Date   WBC 10.3 11/15/2015   HGB 8.2 (L) 11/15/2015   HCT 25.0 (L) 11/15/2015   PLT 173 11/15/2015   GLUCOSE 146 (H) 11/15/2015   CHOL 214 (H) 07/14/2013   TRIG 118 07/14/2013   HDL 36 (L) 07/14/2013   LDLCALC 154 (H) 07/14/2013   ALT 14 11/09/2015   AST 21 11/09/2015   NA 137 11/15/2015   K 3.9 11/15/2015   CL 101 11/15/2015   CREATININE 0.84 11/15/2015   BUN 14 11/15/2015   CO2 26 11/15/2015   TSH 0.763 01/23/2012   INR 1.44 11/10/2015   HGBA1C 8.2 (H) 11/09/2015      Assessment / Plan:     Patient's incision was cleaned with very minor debridement she had her daughter are instructed in local wound care, and applying Betadine and a dressing twice a day.  Rhythm strip was done in the office today showing sinus rhythm  She'll return next week for wound check, at this point incision does not look infected nor has she had signs and symptoms of infection so we will not start any antibiotic at this time. She will continue to monitor her temperature and let us know if she has any fever.       [name] MD      [address] [place] [address] Office [phone]   Beeper [phone]  11/26/2015 2:56 PM

## 2015-11-26 NOTE — Patient Instructions (Signed)
Local wound care demonstrated and will be called to advance home care

## 2015-12-03 ENCOUNTER — Other Ambulatory Visit: Payer: Self-pay | Admitting: Cardiothoracic Surgery

## 2015-12-03 DIAGNOSIS — Z951 Presence of aortocoronary bypass graft: Secondary | ICD-10-CM

## 2015-12-04 ENCOUNTER — Ambulatory Visit
Admission: RE | Admit: 2015-12-04 | Discharge: 2015-12-04 | Disposition: A | Discharge status: Home / Self Care | Payer: BLUE CROSS/BLUE SHIELD | Source: Ambulatory Visit | Attending: Cardiothoracic Surgery | Admitting: Cardiothoracic Surgery

## 2015-12-04 ENCOUNTER — Ambulatory Visit (INDEPENDENT_AMBULATORY_CARE_PROVIDER_SITE_OTHER): Payer: Self-pay | Admitting: Cardiothoracic Surgery

## 2015-12-04 VITALS — BP 123/82 | HR 95 | Resp 20 | Ht 63.0 in | Wt 172.0 lb

## 2015-12-04 DIAGNOSIS — Z951 Presence of aortocoronary bypass graft: Secondary | ICD-10-CM

## 2015-12-04 NOTE — Progress Notes (Signed)
[address]       [place] [address]             [phone]      Candace [name] [place] Medical Record [id number] Date of Birth: [birth year]  Referring: Candace Skains, MD Primary Care: [facility]  Chief Complaint:   POST OP FOLLOW UP  History of Present Illness:     11/10/2015 OPERATIVE REPORT PREOPERATIVE DIAGNOSIS:  Coronary occlusive disease with unstable angina. POSTOPERATIVE DIAGNOSIS:  Coronary occlusive disease with unstable angina. SURGICAL PROCEDURE:  Coronary artery bypass grafting x4 with the left internal mammary to the left anterior descending coronary artery, sequential reverse saphenous vein graft to the first and second obtuse marginal, reverse saphenous vein graft to the continuation branch of the right coronary artery with bilateral right and left greater saphenous vein harvesting. SURGEON:  Candace Bal, MD.  Patient returns office today after coronary artery bypass grafting done on August 14. She's been making reasonable progress at home with some complaint of poor appetite and difficulty sleeping. She denies any fevers. She is increasing her physical activity. She comes in the office today because of some separation of the lower sternal incision without significant drainage. She's had no popping or clicking of the sternum.  She has noted some increased heart rate with activity, cardiology recently increased her beta blocker.   Past Medical History:  Diagnosis Date  . Anxiety   . Arrhythmia   . Arthritis   . Diabetes mellitus without complication ([place])   . Hyperlipidemia   . Hypertension   . Reflux   . Stroke [facility])      History  Smoking Status  . Former Smoker  . Types: Cigarettes  . Quit date: 10/07/2001  Smokeless Tobacco  . Never Used    History  Alcohol Use No     Allergies  Allergen Reactions  . Penicillins Swelling, Rash and Other (See Comments)    UNSPECIFIED     Current  Outpatient Prescriptions  Medication Sig Dispense Refill  . aspirin EC 325 MG EC tablet Take 1 tablet (325 mg total) by mouth daily.    . cetirizine (ZYRTEC) 10 MG tablet Take 10 mg by mouth daily.    . ferrous Q000111Q C-folic acid (TRINSICON / FOLTRIN) capsule Take 1 capsule by mouth 3 (three) times daily after meals. 90 capsule 1  . glipiZIDE (GLUCOTROL) 10 MG tablet Take 5-10 mg by mouth 2 (two) times daily. Pt takes one tablet in the morning and one-half tablet at bedtime.    Marland Kitchen linagliptin (TRADJENTA) 5 MG TABS tablet Take 5 mg by mouth daily.    . metFORMIN (GLUCOPHAGE-XR) 750 MG 24 hr tablet Take 1 tablet (750 mg total) by mouth 2 (two) times daily. 60 tablet 1  . metoprolol (LOPRESSOR) 50 MG tablet Take 50 mg by mouth 2 (two) times daily.     Marland Kitchen oxyCODONE (OXY IR/ROXICODONE) 5 MG immediate release tablet Take 1 tablet (5 mg total) by mouth every 6 (six) hours as needed for severe pain. 30 tablet 0  . pantoprazole (PROTONIX) 40 MG tablet Take 40 mg by mouth daily.    . rosuvastatin (CRESTOR) 40 MG tablet Take 1 tablet (40 mg total) by mouth daily at 6 PM. 30 tablet 1  . sertraline (ZOLOFT) 50 MG tablet Take 50 mg by mouth daily.    . metoprolol tartrate (LOPRESSOR) 25 MG tablet Take 1 tablet (25 mg total) by mouth  2 (two) times daily. (Patient not taking: Reported on 12/04/2015) 30 tablet 1   No current facility-administered medications for this visit.        Physical Exam: BP 123/82 (BP Location: Right Arm, Patient Position: Sitting, Cuff Size: Normal)   Pulse 95   Resp 20   Ht 5\' 3"  (1.6 m)   Wt 172 lb (78 kg)   SpO2 98% Comment: RA  BMI 30.47 kg/m   General appearance: alert and cooperative Neurologic: intact Heart: regular rate and rhythm, S1, S2 normal, no murmur, click, rub or gallop Lungs: clear to auscultation bilaterally Abdomen: soft, non-tender; bowel sounds normal; no masses,  no organomegaly Extremities: extremities normal, atraumatic, no cyanosis or  edema and Homans sign is negative, no sign of DVT Wound: Has superficial separation with slight skin necrosis at the lower end of her sternal incision the remainder of the incision is intact the sternum is stable   Photo upside down  Diagnostic Studies & Laboratory data:     Recent Radiology Findings:   Dg Chest 2 View  Result Date: 12/04/2015 CLINICAL DATA:  CABG.  Chest tenderness. EXAM: CHEST  2 VIEW COMPARISON:  11/14/2015 . FINDINGS: Prior CABG. Heart size normal. No focal infiltrate. No pleural effusion or pneumothorax. Previously identified pleural effusion has cleared. IMPRESSION: 1. Prior CABG.  Heart size normal. 2. Interim clearing of right pleural effusion. The lungs are clear, no evidence of pulmonary infiltrate. Electronically Signed   By: Marcello Moores  Register   On: 12/04/2015 14:04      Recent Lab Findings: Lab Results  Component Value Date   WBC 10.3 11/15/2015   HGB 8.2 (L) 11/15/2015   HCT 25.0 (L) 11/15/2015   PLT 173 11/15/2015   GLUCOSE 146 (H) 11/15/2015   CHOL 214 (H) 07/14/2013   TRIG 118 07/14/2013   HDL 36 (L) 07/14/2013   LDLCALC 154 (H) 07/14/2013   ALT 14 11/09/2015   AST 21 11/09/2015   NA 137 11/15/2015   K 3.9 11/15/2015   CL 101 11/15/2015   CREATININE 0.84 11/15/2015   BUN 14 11/15/2015   CO2 26 11/15/2015   TSH 0.763 01/23/2012   INR 1.44 11/10/2015   HGBA1C 8.2 (H) 11/09/2015      Assessment / Plan:     Patient's incision was cleaned with very minor debridement she had her daughter are instructed in local wound care, and applying Betadine and a dressing twice a day.  She'll continue with home physical therapy, and then progressed to outpatient cardiac rehabilitation as tolerated  She'll return 2 weeks for wound check,     Candace Isaac MD      [address] [place] [address] Office [phone]   Beeper [phone]  12/04/2015 2:14 PM

## 2015-12-08 ENCOUNTER — Ambulatory Visit: Payer: BLUE CROSS/BLUE SHIELD | Admitting: Neurology

## 2015-12-11 ENCOUNTER — Ambulatory Visit: Payer: BLUE CROSS/BLUE SHIELD | Admitting: Cardiothoracic Surgery

## 2015-12-18 ENCOUNTER — Encounter: Payer: Self-pay | Admitting: Cardiothoracic Surgery

## 2015-12-19 ENCOUNTER — Encounter: Payer: Self-pay | Admitting: Pulmonary Disease

## 2015-12-19 ENCOUNTER — Ambulatory Visit (INDEPENDENT_AMBULATORY_CARE_PROVIDER_SITE_OTHER): Payer: BLUE CROSS/BLUE SHIELD | Admitting: Pulmonary Disease

## 2015-12-19 VITALS — BP 120/78 | HR 83 | Ht 63.0 in | Wt 172.0 lb

## 2015-12-19 DIAGNOSIS — Z23 Encounter for immunization: Secondary | ICD-10-CM | POA: Diagnosis not present

## 2015-12-19 DIAGNOSIS — J449 Chronic obstructive pulmonary disease, unspecified: Secondary | ICD-10-CM | POA: Diagnosis not present

## 2015-12-19 DIAGNOSIS — R0989 Other specified symptoms and signs involving the circulatory and respiratory systems: Secondary | ICD-10-CM

## 2015-12-19 MED ORDER — ALBUTEROL SULFATE HFA 108 (90 BASE) MCG/ACT IN AERS: 2.0000 | INHALATION_SPRAY | Freq: Four times a day (QID) | RESPIRATORY_TRACT | 6 refills | Status: DC | PRN

## 2015-12-19 NOTE — Assessment & Plan Note (Addendum)
Not convinced that she has COPD, in fact spirometry seems to suggest restriction-no apparent cause for this   Flu shot today  Prescription for albuterol MDI 2 puffs every 6 hours as needed for wheezing or shortness of breath  Return in 2 months to do breathing test -spirometry pre-/post

## 2015-12-19 NOTE — Progress Notes (Signed)
   Subjective:    Patient ID: Candace [name], female    DOB: 1950/10/27, 65 y.o.   MRN: [id number]  HPI  65 year old  Remote smoker S/p urgent CABG 11/10/15 for 3V -CAD  Chief Complaint  Patient presents with  . Hospitalization Follow-up    D/C on 11/27/15   She developed bronchospasm postoperatively that responded well to steroids. She was a remote smoker, less than half pack per day-less than 20 pack years. Spirometry in the hospital showed moderate restriction with FVC 44% and ratio 76. She had mild hypercarbia and initial ABG that resolved. She was discharged without bronchodilators on a tapering dose of prednisone and has seemed to do well, other than some drainage from her chest incision. She is now getting home physical therapy and denies dyspnea or wheezing or pedal edema  CT chest 04/2014 shows no evidence of emphysema or fibrosis  Past Medical History:  Diagnosis Date  . Anxiety   . Arrhythmia   . Arthritis   . Diabetes mellitus without complication ([place])   . Hyperlipidemia   . Hypertension   . Reflux   . Stroke [facility])     Review of Systems neg for any significant sore throat, dysphagia, itching, sneezing, nasal congestion or excess/ purulent secretions, fever, chills, sweats, unintended wt loss, pleuritic or exertional cp, hempoptysis, orthopnea pnd or change in chronic leg swelling.  Also denies presyncope, palpitations, heartburn, abdominal pain, nausea, vomiting, diarrhea or change in bowel or urinary habits, dysuria,hematuria, rash, arthralgias, visual complaints, headache, numbness weakness or ataxia.     Objective:   Physical Exam  Gen. Pleasant, well-nourished, in no distress ENT - no lesions, no post nasal drip Neck: No JVD, no thyromegaly, no carotid bruits Lungs: no use of accessory muscles, no dullness to percussion, clear without rales or rhonchi  Cardiovascular: Rhythm regular, heart sounds  normal, no murmurs or gallops, no peripheral  edema Musculoskeletal: No deformities, no cyanosis or clubbing        Assessment & Plan:

## 2015-12-19 NOTE — Patient Instructions (Signed)
Flu shot today  Prescription for albuterol MDI 2 puffs every 6 hours as needed for wheezing or shortness of breath  Return in 2 months to do breathing test -spirometry pre-/post

## 2015-12-24 ENCOUNTER — Encounter: Payer: Self-pay | Admitting: Physician Assistant

## 2015-12-24 ENCOUNTER — Ambulatory Visit (INDEPENDENT_AMBULATORY_CARE_PROVIDER_SITE_OTHER): Payer: Self-pay | Admitting: Physician Assistant

## 2015-12-24 VITALS — BP 124/81 | HR 90 | Resp 20 | Ht 63.0 in | Wt 172.0 lb

## 2015-12-24 DIAGNOSIS — Z951 Presence of aortocoronary bypass graft: Secondary | ICD-10-CM

## 2015-12-24 NOTE — Progress Notes (Signed)
Candace [name] is a 65 y.o. female patient.  1. S/P CABG x 4    Past Medical History:  Diagnosis Date  . Anxiety   . Arrhythmia   . Arthritis   . Diabetes mellitus without complication ([place])   . Hyperlipidemia   . Hypertension   . Reflux   . Stroke [facility])    No past surgical history pertinent negatives on file. Scheduled Meds: Current Outpatient Prescriptions on File Prior to Visit  Medication Sig Dispense Refill  . albuterol (PROVENTIL HFA;VENTOLIN HFA) 108 (90 Base) MCG/ACT inhaler Inhale 2 puffs into the lungs every 6 (six) hours as needed for wheezing or shortness of breath. 1 Inhaler 6  . aspirin EC 325 MG EC tablet Take 1 tablet (325 mg total) by mouth daily.    . cetirizine (ZYRTEC) 10 MG tablet Take 10 mg by mouth daily.    . ferrous Q000111Q C-folic acid (TRINSICON / FOLTRIN) capsule Take 1 capsule by mouth 3 (three) times daily after meals. 90 capsule 1  . glipiZIDE (GLUCOTROL) 10 MG tablet Take 5-10 mg by mouth 2 (two) times daily. Pt takes one tablet in the morning and one-half tablet at bedtime.    Marland Kitchen linagliptin (TRADJENTA) 5 MG TABS tablet Take 5 mg by mouth daily.    . metFORMIN (GLUCOPHAGE-XR) 750 MG 24 hr tablet Take 1 tablet (750 mg total) by mouth 2 (two) times daily. 60 tablet 1  . metoprolol (LOPRESSOR) 50 MG tablet Take 50 mg by mouth 2 (two) times daily.     Marland Kitchen oxyCODONE (OXY IR/ROXICODONE) 5 MG immediate release tablet Take 1 tablet (5 mg total) by mouth every 6 (six) hours as needed for severe pain. 30 tablet 0  . pantoprazole (PROTONIX) 40 MG tablet Take 40 mg by mouth daily.    . rosuvastatin (CRESTOR) 40 MG tablet Take 1 tablet (40 mg total) by mouth daily at 6 PM. 30 tablet 1  . sertraline (ZOLOFT) 50 MG tablet Take 50 mg by mouth daily.     No current facility-administered medications on file prior to visit.     Allergies  Allergen Reactions  . Penicillins Swelling, Rash and Other (See Comments)    UNSPECIFIED    Active Problems:   * No  active hospital problems. *  Blood pressure 124/81, pulse 90, resp. rate 20, height 5\' 3"  (1.6 m), weight 172 lb (78 kg), SpO2 95 %.  Subjective  Says she feels good overall but has good days and bad days. She is experiencing some back pain.  Objective  Gen: alert and oriented, no distress Cor: RRR, no murmur Pulm: CTA bilaterally  Abdomen: soft, nontender Incisions: c/d/i, a small amount of drainage coming from the distal incision. Healing well. EVH site c/d/i Extremities: No edema  Assessment & Plan  Candace [name] returns to our office today for evaluation of a draining sternal incision, which has greatly improved. She is status post CABG 4 on 11/10/2015. She Is working with physical therapy at home and was having episodes of hypotension after activity. She recently had her ACE inhibitor discontinued and her cardiologist increase her metoprolol. She has had great results with this medication change does not have any other episodes of hypotension. She has had some lower back pain at night. I suggested putting a pillow underneath her knees to help ease some of her pain. Her daughter has been providing her with lidocaine patches and ice as needed. I suggested that she bring this up during her primary  care appointment next month. I offered to help move up her appointment if she is unable to. She has a cardiology appointment next week in which she will obtain a referral for cardiopulmonary rehabilitation. She is also to follow up with neurology for her dizziness and lightheaded episodes. She will need clearance from neurology before driving. She did share that she has had some sternal incisional swelling after activity. I suggested that she stay to a less than 5 pounds upper extremity weight limit for the time being. Her incision did not appear swollen today and is healing well. Her sternum is stable to palpation. She is encouraged to walk several times a day. She is now only taking Tylenol most days  with the occasional oxycodone. She sometimes has shortness of breath with activity. She is encouraged to return to our office if she has any other questions or concerns. She is to follow-up with our office on an as-needed basis. If she notices additional drainage from her incision or if it becomes worse or red she is to call our office immediately for sternal incision wound check. All questions were answered to the patient and daughter's satisfaction.  Candace [name] 12/24/2015

## 2015-12-24 NOTE — Progress Notes (Signed)
ELANNA PILLARD is a 65 y.o. female patient.  1. S/P CABG x 4    Past Medical History:  Diagnosis Date  . Anxiety   . Arrhythmia   . Arthritis   . Diabetes mellitus without complication ([place])   . Hyperlipidemia   . Hypertension   . Reflux   . Stroke [facility])    No past surgical history pertinent negatives on file. Scheduled Meds: Continuous Infusions: PRN Meds:  Allergies  Allergen Reactions  . Penicillins Swelling, Rash and Other (See Comments)    UNSPECIFIED    Active Problems:   * No active hospital problems. *  Blood pressure 124/81, pulse 90, resp. rate 20, height 5\' 3"  (1.6 m), weight 172 lb (78 kg), SpO2 95 %.  Subjective Objective Assessment & Plan  Elgie Collard 12/24/2015

## 2015-12-31 ENCOUNTER — Encounter: Payer: Self-pay | Admitting: Neurology

## 2015-12-31 ENCOUNTER — Ambulatory Visit (INDEPENDENT_AMBULATORY_CARE_PROVIDER_SITE_OTHER): Payer: BLUE CROSS/BLUE SHIELD | Admitting: Neurology

## 2015-12-31 VITALS — BP 116/74 | HR 97 | Ht 63.0 in | Wt 168.0 lb

## 2015-12-31 DIAGNOSIS — R001 Bradycardia, unspecified: Secondary | ICD-10-CM | POA: Insufficient documentation

## 2015-12-31 DIAGNOSIS — R9401 Abnormal electroencephalogram [EEG]: Secondary | ICD-10-CM | POA: Diagnosis not present

## 2015-12-31 DIAGNOSIS — R404 Transient alteration of awareness: Secondary | ICD-10-CM

## 2015-12-31 DIAGNOSIS — R410 Disorientation, unspecified: Secondary | ICD-10-CM | POA: Diagnosis not present

## 2015-12-31 DIAGNOSIS — R29898 Other symptoms and signs involving the musculoskeletal system: Secondary | ICD-10-CM | POA: Diagnosis not present

## 2015-12-31 NOTE — Patient Instructions (Signed)
1.  We will check MRI and MRA of brain to look for specific cause of recurrent confusion 2.  We will check a 24 hour ambulatory EEG 3.  At this point, I don't recommend living by yourself until we can finish more tests. 4.  As per [facility], you cannot drive for 6 months after an unexplained loss of awareness or consciousness. 5.  Follow up after testing.
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
## 2015-12-31 NOTE — Progress Notes (Signed)
Chart forwarded.  

## 2016-01-07 ENCOUNTER — Ambulatory Visit (INDEPENDENT_AMBULATORY_CARE_PROVIDER_SITE_OTHER): Payer: BLUE CROSS/BLUE SHIELD | Admitting: Neurology

## 2016-01-07 DIAGNOSIS — R404 Transient alteration of awareness: Secondary | ICD-10-CM | POA: Diagnosis not present

## 2016-01-09 ENCOUNTER — Telehealth: Payer: Self-pay

## 2016-01-09 NOTE — Procedures (Signed)
ELECTROENCEPHALOGRAM REPORT  Dates of Recording: 01/07/2016 at 10:53:40 to 01/08/2016 at 10:22:00  Patient's Name: Candace [name] MRN: [id number] Date of Birth: December 24, 1950  Procedure: 24-hour ambulatory EEG  History: 65 year old woman with syncope and recurrent transient loss of awareness with prior routine EEG consisting of intermittent left temporal slowing.  Medications:  PROVENTIL HFA;VENTOLIN HFA aspirin EC 325  ZYRTEC   TRINSICON / FOLTRIN GLUCOTROL  TRADJENTA  GLUCOPHAGE-XR LOPRESSOR OXY IR/ROXICODONE [place]      Technical Summary: This is a 24-hour multichannel digital EEG recording measured by the international 10-20 system with electrodes applied with paste and impedances below 5000 ohms performed as portable with EKG monitoring.  The digital EEG was referentially recorded, reformatted, and digitally filtered in a variety of bipolar and referential montages for optimal display.    DESCRIPTION OF RECORDING: During maximal wakefulness, the background activity consisted of a symmetric 9Hz  posterior dominant rhythm which was reactive to eye opening.  There were no epileptiform discharges or focal slowing seen in wakefulness.  During the recording, the patient progresses through wakefulness, drowsiness, and Stage 2 sleep.  Again, there were no epileptiform discharges seen.  Events:  There were no electrographic seizures seen.  EKG lead was unremarkable.  IMPRESSION: This 24-hour ambulatory EEG study is normal.    CLINICAL CORRELATION: A normal EEG does not exclude a clinical diagnosis of epilepsy.  If further clinical questions remain, inpatient video EEG monitoring may be helpful.   Metta Clines, DO

## 2016-01-09 NOTE — Telephone Encounter (Signed)
Message relayed to patient. Verbalized understanding and denied questions.   

## 2016-01-09 NOTE — Telephone Encounter (Signed)
-----   Message from Pieter Partridge, DO sent at 01/09/2016  6:59 AM EDT ----- 24 hour eeg is normal

## 2016-01-12 ENCOUNTER — Encounter: Payer: BLUE CROSS/BLUE SHIELD | Attending: Cardiothoracic Surgery | Admitting: *Deleted

## 2016-01-12 VITALS — Ht 62.8 in | Wt 168.7 lb

## 2016-01-12 DIAGNOSIS — Z79899 Other long term (current) drug therapy: Secondary | ICD-10-CM | POA: Diagnosis not present

## 2016-01-12 DIAGNOSIS — Z951 Presence of aortocoronary bypass graft: Secondary | ICD-10-CM | POA: Diagnosis not present

## 2016-01-12 NOTE — Progress Notes (Signed)
Daily Session Note  Patient Details  Name: [name] MRN: [id number] Date of Birth: [birth year] Referring Provider:    Encounter Date: 01/12/2016  Check In:     Session Check In - 01/12/16 1347      Check-In   Location [facility]   Staff Present Heath Lark, RN, BSN, CCRP;Linda Grimmer, RN, Levie Heritage, MA, ACSM RCEP, Exercise Physiologist   Supervising physician immediately available to respond to emergencies See telemetry face sheet for immediately available ER MD   Medication changes reported     No   Fall or balance concerns reported    No   Warm-up and Cool-down Performed as group-led instruction   Resistance Training Performed Yes   VAD Patient? No     Pain Assessment   Currently in Pain? No/denies         Goals Met:  Personal goals reviewed No report of cardiac concerns or symptoms  Goals Unmet:  Not Applicable  Comments:     Dr. Emily Filbert is Medical Director for [facility].

## 2016-01-12 NOTE — Patient Instructions (Addendum)
Patient Instructions  Patient Details  Name: Candace [name] MRN: [id number] Date of Birth: [birth year] Referring Provider:  [name], MD  Below are the personal goals you chose as well as exercise and nutrition goals. Our goal is to help you keep on track towards obtaining and maintaining your goals. We will be discussing your progress on these goals with you throughout the program.  Initial Exercise Prescription:     Initial Exercise Prescription - 01/12/16 1500      Date of Initial Exercise RX and Referring Provider   Date 01/12/16   Referring Provider [name]     NuStep   Level 1   Minutes 15   METs 2     Biostep-RELP   Level 1   Minutes 15   METs 2     Track   Laps 15   Minutes 15   METs 1.7     Prescription Details   Frequency (times per week) 3   Duration Progress to 45 minutes of aerobic exercise without signs/symptoms of physical distress     Intensity   THRR 40-80% of Max Heartrate 114-142   Ratings of Perceived Exertion 11-15   Perceived Dyspnea 0-4     Progression   Progression Continue to progress workloads to maintain intensity without signs/symptoms of physical distress.     Resistance Training   Training Prescription Yes   Weight 2 lbs   Reps 10-12      Exercise Goals: Frequency: Be able to perform aerobic exercise three times per week working toward 3-5 days per week.  Intensity: Work with a perceived exertion of 11 (fairly light) - 15 (hard) as tolerated. Follow your new exercise prescription and watch for changes in prescription as you progress with the program. Changes will be reviewed with you when they are made.  Duration: You should be able to do 30 minutes of continuous aerobic exercise in addition to a 5 minute warm-up and a 5 minute cool-down routine.  Nutrition Goals: Your personal nutrition goals will be established when you do your nutrition analysis with the dietician.  The following are nutrition  guidelines to follow: Cholesterol < 200mg /day Sodium < 1500mg /day Fiber: Women over 50 yrs - 21 grams per day  Personal Goals:     Personal Goals and Risk Factors at Admission - 01/12/16 1353      Core Components/Risk Factors/Patient Goals on Admission    Weight Management Yes;Obesity;Weight Loss   Intervention Weight Management: Develop a combined nutrition and exercise program designed to reach desired caloric intake, while maintaining appropriate intake of nutrient and fiber, sodium and fats, and appropriate energy expenditure required for the weight goal.;Weight Management: Provide education and appropriate resources to help participant work on and attain dietary goals.;Weight Management/Obesity: Establish reasonable short term and long term weight goals.;Obesity: Provide education and appropriate resources to help participant work on and attain dietary goals.   Admit Weight 168 lb 11.2 oz (76.5 kg)   Goal Weight: Short Term 165 lb (74.8 kg)   Goal Weight: Long Term 158 lb (71.7 kg)   Expected Outcomes Short Term: Continue to assess and modify interventions until short term weight is achieved;Long Term: Adherence to nutrition and physical activity/exercise program aimed toward attainment of established weight goal;Weight Loss: Understanding of general recommendations for a balanced deficit meal plan, which promotes 1-2 lb weight loss per week and includes a negative energy balance of [phone] kcal/d;Understanding of distribution of calorie intake throughout the day with the  consumption of 4-5 meals/snacks;Understanding recommendations for meals to include 15-35% energy as protein, 25-35% energy from fat, 35-60% energy from carbohydrates, less than 200mg  of dietary cholesterol, 20-35 gm of total fiber daily   Sedentary Yes   Intervention Provide advice, education, support and counseling about physical activity/exercise needs.;Develop an individualized exercise prescription for aerobic and  resistive training based on initial evaluation findings, risk stratification, comorbidities and participant's personal goals.   Expected Outcomes Achievement of increased cardiorespiratory fitness and enhanced flexibility, muscular endurance and strength shown through measurements of functional capacity and personal statement of participant.   Increase Strength and Stamina Yes   Intervention Provide advice, education, support and counseling about physical activity/exercise needs.;Develop an individualized exercise prescription for aerobic and resistive training based on initial evaluation findings, risk stratification, comorbidities and participant's personal goals.   Expected Outcomes Achievement of increased cardiorespiratory fitness and enhanced flexibility, muscular endurance and strength shown through measurements of functional capacity and personal statement of participant.   Diabetes Yes   Intervention Provide education about signs/symptoms and action to take for hypo/hyperglycemia.;Provide education about proper nutrition, including hydration, and aerobic/resistive exercise prescription along with prescribed medications to achieve blood glucose in normal ranges: Fasting glucose 65-99 mg/dL   Expected Outcomes Short Term: Participant verbalizes understanding of the signs/symptoms and immediate care of hyper/hypoglycemia, proper foot care and importance of medication, aerobic/resistive exercise and nutrition plan for blood glucose control.;Long Term: Attainment of HbA1C < 7%.   Hypertension Yes   Intervention Provide education on lifestyle modifcations including regular physical activity/exercise, weight management, moderate sodium restriction and increased consumption of fresh fruit, vegetables, and low fat dairy, alcohol moderation, and smoking cessation.;Monitor prescription use compliance.   Expected Outcomes Short Term: Continued assessment and intervention until BP is < 140/31mm HG in  hypertensive participants. < 130/36mm HG in hypertensive participants with diabetes, heart failure or chronic kidney disease.;Long Term: Maintenance of blood pressure at goal levels.   Lipids Yes   Intervention Provide education and support for participant on nutrition & aerobic/resistive exercise along with prescribed medications to achieve LDL 70mg , HDL >40mg .   Expected Outcomes Short Term: Participant states understanding of desired cholesterol values and is compliant with medications prescribed. Participant is following exercise prescription and nutrition guidelines.;Long Term: Cholesterol controlled with medications as prescribed, with individualized exercise RX and with personalized nutrition plan. Value goals: LDL < 70mg , HDL > 40 mg.   Personal Goal Other Yes   Personal Goal To go back to driving herself. Yuki would like to be able to physically pick up her 4 year old great grandchild.    Intervention Encourage her to exercise in Cardiac Rehab 2-3 times a week.    Expected Outcomes For Neliah to be able to drive again. For Ange to be able to have the strength to physically pick up her 91 year old grandchild.       Tobacco Use Initial Evaluation: History  Smoking Status  . Former Smoker  . Types: Cigarettes  . Quit date: 10/07/2001  Smokeless Tobacco  . Never Used    Copy of goals given to participant.

## 2016-01-12 NOTE — Progress Notes (Signed)
Cardiac Individual Treatment Plan  Patient Details  Name: Candace [name] MRN: [id number] Date of Birth: Mar 14, 1951 Referring Provider:   Flowsheet Row Cardiac Rehab from 01/12/2016 in [facility] Cardiac and Pulmonary Rehab  Referring Provider  [name]      Initial Encounter Date:  Flowsheet Row Cardiac Rehab from 01/12/2016 in [facility] Cardiac and Pulmonary Rehab  Date  01/12/16  Referring Provider  [name]      Visit Diagnosis: S/P CABG x 4  Patient's Home Medications on Admission:  Current Outpatient Prescriptions:  .  albuterol (PROVENTIL HFA;VENTOLIN HFA) 108 (90 Base) MCG/ACT inhaler, Inhale 2 puffs into the lungs every 6 (six) hours as needed for wheezing or shortness of breath., Disp: 1 Inhaler, Rfl: 6 .  aspirin EC 325 MG EC tablet, Take 1 tablet (325 mg total) by mouth daily., Disp: , Rfl:  .  cetirizine (ZYRTEC) 10 MG tablet, Take 10 mg by mouth daily., Disp: , Rfl:  .  dicyclomine (BENTYL) 10 MG capsule, Take 10 mg by mouth 4 (four) times daily -  before meals and at bedtime., Disp: , Rfl:  .  ferrous [id number] C-folic acid (TRINSICON / FOLTRIN) capsule, Take 1 capsule by mouth 3 (three) times daily after meals., Disp: 90 capsule, Rfl: 1 .  glipiZIDE (GLUCOTROL) 10 MG tablet, Take 5-10 mg by mouth 2 (two) times daily. Pt takes one tablet in the morning and one-half tablet at bedtime., Disp: , Rfl:  .  linagliptin (TRADJENTA) 5 MG TABS tablet, Take 5 mg by mouth daily., Disp: , Rfl:  .  metFORMIN (GLUCOPHAGE-XR) 750 MG 24 hr tablet, Take 1 tablet (750 mg total) by mouth 2 (two) times daily., Disp: 60 tablet, Rfl: 1 .  metoprolol (LOPRESSOR) 50 MG tablet, Take 50 mg by mouth 2 (two) times daily. , Disp: , Rfl:  .  oxyCODONE (OXY IR/ROXICODONE) 5 MG immediate release tablet, Take 1 tablet (5 mg total) by mouth every 6 (six) hours as needed for severe pain., Disp: 30 tablet, Rfl: 0 .  pantoprazole (PROTONIX) 40 MG tablet, Take 40 mg by mouth daily.,  Disp: , Rfl:  .  rosuvastatin (CRESTOR) 40 MG tablet, Take 1 tablet (40 mg total) by mouth daily at 6 PM., Disp: 30 tablet, Rfl: 1 .  sertraline (ZOLOFT) 50 MG tablet, Take 50 mg by mouth daily., Disp: , Rfl:   Past Medical History: Past Medical History:  Diagnosis Date  . Anxiety   . Arrhythmia   . Arthritis   . Diabetes mellitus without complication ([place])   . Hyperlipidemia   . Hypertension   . Reflux   . Stroke [facility])     Tobacco Use: History  Smoking Status  . Former Smoker  . Types: Cigarettes  . Quit date: 10/07/2001  Smokeless Tobacco  . Never Used    Labs: Recent Review Flowsheet Data    Labs for ITP Cardiac and Pulmonary Rehab Latest Ref Rng & Units 11/10/2015 11/10/2015 11/10/2015 11/11/2015 11/11/2015   Cholestrol 0 - 200 mg/dL - - - - -   LDLCALC 0 - 100 mg/dL - - - - -   HDL 40 - 60 mg/dL - - - - -   Trlycerides 0 - 200 mg/dL - - - - -   Hemoglobin A1c 4.8 - 5.6 % - - - - -   PHART 7.350 - 7.450 - 7.335(L) 7.434 7.348(L) -   PCO2ART 35.0 - 45.0 mmHg - 51.8(H) 37.2 48.3(H) -   HCO3 20.0 -  24.0 mEq/L - 27.7(H) 24.9(H) 26.4(H) -   TCO2 0 - 100 mmol/L 29 29 26 28 26    O2SAT % - 100.0 93.0 95.0 -       Exercise Target Goals: Date: 01/12/16  Exercise Program Goal: Individual exercise prescription set with THRR, safety & activity barriers. Participant demonstrates ability to understand and report RPE using BORG scale, to self-measure pulse accurately, and to acknowledge the importance of the exercise prescription.  Exercise Prescription Goal: Starting with aerobic activity 30 plus minutes a day, 3 days per week for initial exercise prescription. Provide home exercise prescription and guidelines that participant acknowledges understanding prior to discharge.  Activity Barriers & Risk Stratification:     Activity Barriers & Cardiac Risk Stratification - 01/12/16 1346      Activity Barriers & Cardiac Risk Stratification   Activity Barriers  Deconditioning;Muscular Weakness;Incisional Pain;Balance Concerns;Assistive Device;Neck/Spine Problems;Back Problems;Other (comment)   Comments possible [employer]   Cardiac Risk Stratification High      6 Minute Walk:     6 Minute Walk    Row Name 01/12/16 1526         6 Minute Walk   Phase Initial     Distance 835 feet     Walk Time 6 minutes     # of Rest Breaks 0     MPH 1.58     METS 2.26     RPE 12     VO2 Peak 7.91     Comments incisional pain 6/10, fatigue (gait changed as she got more fatigued)     Resting HR 86 bpm     Resting BP 126/64     Max Ex. HR 110 bpm     Max Ex. BP 126/74     2 Minute Post BP 116/64        Initial Exercise Prescription:     Initial Exercise Prescription - 01/12/16 1500      Date of Initial Exercise RX and Referring Provider   Date 01/12/16   Referring Provider [name]     NuStep   Level 1   Minutes 15   METs 2     Biostep-RELP   Level 1   Minutes 15   METs 2     Track   Laps 15   Minutes 15   METs 1.7     Prescription Details   Frequency (times per week) 3   Duration Progress to 45 minutes of aerobic exercise without signs/symptoms of physical distress     Intensity   THRR 40-80% of Max Heartrate 114-142   Ratings of Perceived Exertion 11-15   Perceived Dyspnea 0-4     Progression   Progression Continue to progress workloads to maintain intensity without signs/symptoms of physical distress.     Resistance Training   Training Prescription Yes   Weight 2 lbs   Reps 10-12      Perform Capillary Blood Glucose checks as needed.  Exercise Prescription Changes:      Exercise Prescription Changes    Row Name 01/12/16 1500             Exercise Review   Progression -  walk test results         Response to Exercise   Blood Pressure (Admit) 126/64       Blood Pressure (Exercise) 126/74       Blood Pressure (Exit) 116/64       Heart Rate (Admit) 86 bpm  Heart Rate (Exercise) 110 bpm        Heart Rate (Exit) 87 bpm       Oxygen Saturation (Admit) 98 %       Oxygen Saturation (Exercise) 98 %       Rating of Perceived Exertion (Exercise) 12       Symptoms incisional pain 6/10, fatigue          Exercise Comments:      Exercise Comments    Row Name 01/12/16 1531           Exercise Comments Candace [name] wants to be able to go shopping and play with her great grandchildren          Discharge Exercise Prescription (Final Exercise Prescription Changes):     Exercise Prescription Changes - 01/12/16 1500      Exercise Review   Progression --  walk test results     Response to Exercise   Blood Pressure (Admit) 126/64   Blood Pressure (Exercise) 126/74   Blood Pressure (Exit) 116/64   Heart Rate (Admit) 86 bpm   Heart Rate (Exercise) 110 bpm   Heart Rate (Exit) 87 bpm   Oxygen Saturation (Admit) 98 %   Oxygen Saturation (Exercise) 98 %   Rating of Perceived Exertion (Exercise) 12   Symptoms incisional pain 6/10, fatigue      Nutrition:  Target Goals: Understanding of nutrition guidelines, daily intake of sodium 1500mg , cholesterol 200mg , calories 30% from fat and 7% or less from saturated fats, daily to have 5 or more servings of fruits and vegetables.  Biometrics:     Pre Biometrics - 01/12/16 1532      Pre Biometrics   Height 5' 2.8" (1.595 m)   Weight 168 lb 11.2 oz (76.5 kg)   Waist Circumference 39 inches   Hip Circumference 41 inches   Waist to Hip Ratio 0.95 %   BMI (Calculated) 30.1   Single Leg Stand 1.37 seconds       Nutrition Therapy Plan and Nutrition Goals:     Nutrition Therapy & Goals - 01/12/16 1351      Nutrition Therapy   Drug/Food Interactions Statins/Certain Fruits     Intervention Plan   Intervention Prescribe, educate and counsel regarding individualized specific dietary modifications aiming towards targeted core components such as weight, hypertension, lipid management, diabetes, heart failure and other  comorbidities.;Nutrition handout(s) given to patient.   Expected Outcomes Short Term Goal: Understand basic principles of dietary content, such as calories, fat, sodium, cholesterol and nutrients.;Short Term Goal: A plan has been developed with personal nutrition goals set during dietitian appointment.;Long Term Goal: Adherence to prescribed nutrition plan.      Nutrition Discharge: Rate Your Plate Scores:     Nutrition Assessments - 01/12/16 1352      Rate Your Plate Scores   Pre Score 57   Pre Score % 63.3 %      Nutrition Goals Re-Evaluation:   Psychosocial: Target Goals: Acknowledge presence or absence of depression, maximize coping skills, provide positive support system. Participant is able to verbalize types and ability to use techniques and skills needed for reducing stress and depression.  Initial Review & Psychosocial Screening:     Initial Psych Review & Screening - 01/12/16 1351      Initial Review   Current issues with Current Stress Concerns     Family Dynamics   Good Support System? Yes   Comments Candace [name]'s daughter takes care of her now since Candace [name]'s open  heart surgery. Stress due to Candace [name] daughter is working full time etc. Candace [name] would like to go back to driving herself. Sister would like to be able to take care of and physically pick up her great granchildren.     Barriers   Psychosocial barriers to participate in program The patient should benefit from training in stress management and relaxation.     Screening Interventions   Interventions Encouraged to exercise      Quality of Life Scores:     Quality of Life - 01/12/16 1357      Quality of Life Scores   Health/Function Pre 9.83 %   Socioeconomic Pre 13.75 %   Psych/Spiritual Pre 22.71 %   Family Pre 18.3 %   GLOBAL Pre 14.5 %      PHQ-9: Recent Review Flowsheet Data    Depression screen [facility] 2/9 01/12/2016   Decreased Interest 2   Down, Depressed, Hopeless 2   PHQ - 2 Score 4   Altered sleeping 3    Tired, decreased energy 1   Change in appetite 3   Feeling bad or failure about yourself  1   Trouble concentrating 0   Moving slowly or fidgety/restless 1   Suicidal thoughts 0   PHQ-9 Score 13   Difficult doing work/chores Not difficult at all      Psychosocial Evaluation and Intervention:   Psychosocial Re-Evaluation:   Vocational Rehabilitation: Provide vocational rehab assistance to qualifying candidates.   Vocational Rehab Evaluation & Intervention:     Vocational Rehab - 01/12/16 1347      Initial Vocational Rehab Evaluation & Intervention   Assessment shows need for Vocational Rehabilitation No      Education: Education Goals: Education classes will be provided on a weekly basis, covering required topics. Participant will state understanding/return demonstration of topics presented.  Learning Barriers/Preferences:     Learning Barriers/Preferences - 01/12/16 1347      Learning Barriers/Preferences   Learning Barriers None   Learning Preferences None      Education Topics: General Nutrition Guidelines/Fats and Fiber: -Group instruction provided by verbal, written material, models and posters to present the general guidelines for heart healthy nutrition. Gives an explanation and review of dietary fats and fiber.   Controlling Sodium/Reading Food Labels: -Group verbal and written material supporting the discussion of sodium use in heart healthy nutrition. Review and explanation with models, verbal and written materials for utilization of the food label.   Exercise Physiology & Risk Factors: - Group verbal and written instruction with models to review the exercise physiology of the cardiovascular system and associated critical values. Details cardiovascular disease risk factors and the goals associated with each risk factor.   Aerobic Exercise & Resistance Training: - Gives group verbal and written discussion on the health impact of inactivity. On the  components of aerobic and resistive training programs and the benefits of this training and how to safely progress through these programs.   Flexibility, Balance, General Exercise Guidelines: - Provides group verbal and written instruction on the benefits of flexibility and balance training programs. Provides general exercise guidelines with specific guidelines to those with heart or lung disease. Demonstration and skill practice provided.   Stress Management: - Provides group verbal and written instruction about the health risks of elevated stress, cause of high stress, and healthy ways to reduce stress.   Depression: - Provides group verbal and written instruction on the correlation between heart/lung disease and depressed mood, treatment options, and the stigmas  associated with seeking treatment.   Anatomy & Physiology of the Heart: - Group verbal and written instruction and models provide basic cardiac anatomy and physiology, with the coronary electrical and arterial systems. Review of: AMI, Angina, Valve disease, Heart Failure, Cardiac Arrhythmia, Pacemakers, and the ICD.   Cardiac Procedures: - Group verbal and written instruction and models to describe the testing methods done to diagnose heart disease. Reviews the outcomes of the test results. Describes the treatment choices: Medical Management, Angioplasty, or Coronary Bypass Surgery.   Cardiac Medications: - Group verbal and written instruction to review commonly prescribed medications for heart disease. Reviews the medication, class of the drug, and side effects. Includes the steps to properly store meds and maintain the prescription regimen.   Go Sex-Intimacy & Heart Disease, Get SMART - Goal Setting: - Group verbal and written instruction through game format to discuss heart disease and the return to sexual intimacy. Provides group verbal and written material to discuss and apply goal setting through the application of the  S.M.A.R.T. Method.   Other Matters of the Heart: - Provides group verbal, written materials and models to describe Heart Failure, Angina, Valve Disease, and Diabetes in the realm of heart disease. Includes description of the disease process and treatment options available to the cardiac patient.   Exercise & Equipment Safety: - Individual verbal instruction and demonstration of equipment use and safety with use of the equipment. Flowsheet Row Cardiac Rehab from 01/12/2016 in [facility] Cardiac and Pulmonary Rehab  Date  01/12/16  Educator  C. EnterkinRN  Instruction Review Code  2- meets goals/outcomes      Infection Prevention: - Provides verbal and written material to individual with discussion of infection control including proper hand washing and proper equipment cleaning during exercise session. Flowsheet Row Cardiac Rehab from 01/12/2016 in [facility] Cardiac and Pulmonary Rehab  Date  01/12/16  Educator  C. EnterkinRN  Instruction Review Code  2- meets goals/outcomes      Falls Prevention: - Provides verbal and written material to individual with discussion of falls prevention and safety. Flowsheet Row Cardiac Rehab from 01/12/2016 in [facility] Cardiac and Pulmonary Rehab  Date  01/12/16  Educator  Cindy Hazy, RN  Instruction Review Code  2- meets goals/outcomes      Diabetes: - Individual verbal and written instruction to review signs/symptoms of diabetes, desired ranges of glucose level fasting, after meals and with exercise. Advice that pre and post exercise glucose checks will be done for 3 sessions at entry of program. Flowsheet Row Cardiac Rehab from 01/12/2016 in [facility] Cardiac and Pulmonary Rehab  Date  01/12/16  Educator  Loletha Grayer ENterkinRN  Instruction Review Code  2- meets goals/outcomes       Knowledge Questionnaire Score:     Knowledge Questionnaire Score - 01/12/16 1347      Knowledge Questionnaire Score   Pre Score 20/24      Core Components/Risk Factors/Patient  Goals at Admission:     Personal Goals and Risk Factors at Admission - 01/12/16 1353      Core Components/Risk Factors/Patient Goals on Admission    Weight Management Yes;Obesity;Weight Loss   Intervention Weight Management: Develop a combined nutrition and exercise program designed to reach desired caloric intake, while maintaining appropriate intake of nutrient and fiber, sodium and fats, and appropriate energy expenditure required for the weight goal.;Weight Management: Provide education and appropriate resources to help participant work on and attain dietary goals.;Weight Management/Obesity: Establish reasonable short term and long term weight goals.;Obesity: Provide  education and appropriate resources to help participant work on and attain dietary goals.   Admit Weight 168 lb 11.2 oz (76.5 kg)   Goal Weight: Short Term 165 lb (74.8 kg)   Goal Weight: Long Term 158 lb (71.7 kg)   Expected Outcomes Short Term: Continue to assess and modify interventions until short term weight is achieved;Long Term: Adherence to nutrition and physical activity/exercise program aimed toward attainment of established weight goal;Weight Loss: Understanding of general recommendations for a balanced deficit meal plan, which promotes 1-2 lb weight loss per week and includes a negative energy balance of [phone] kcal/d;Understanding of distribution of calorie intake throughout the day with the consumption of 4-5 meals/snacks;Understanding recommendations for meals to include 15-35% energy as protein, 25-35% energy from fat, 35-60% energy from carbohydrates, less than 200mg  of dietary cholesterol, 20-35 gm of total fiber daily   Sedentary Yes   Intervention Provide advice, education, support and counseling about physical activity/exercise needs.;Develop an individualized exercise prescription for aerobic and resistive training based on initial evaluation findings, risk stratification, comorbidities and participant's personal  goals.   Expected Outcomes Achievement of increased cardiorespiratory fitness and enhanced flexibility, muscular endurance and strength shown through measurements of functional capacity and personal statement of participant.   Increase Strength and Stamina Yes   Intervention Provide advice, education, support and counseling about physical activity/exercise needs.;Develop an individualized exercise prescription for aerobic and resistive training based on initial evaluation findings, risk stratification, comorbidities and participant's personal goals.   Expected Outcomes Achievement of increased cardiorespiratory fitness and enhanced flexibility, muscular endurance and strength shown through measurements of functional capacity and personal statement of participant.   Diabetes Yes   Intervention Provide education about signs/symptoms and action to take for hypo/hyperglycemia.;Provide education about proper nutrition, including hydration, and aerobic/resistive exercise prescription along with prescribed medications to achieve blood glucose in normal ranges: Fasting glucose 65-99 mg/dL   Expected Outcomes Short Term: Participant verbalizes understanding of the signs/symptoms and immediate care of hyper/hypoglycemia, proper foot care and importance of medication, aerobic/resistive exercise and nutrition plan for blood glucose control.;Long Term: Attainment of HbA1C < 7%.   Hypertension Yes   Intervention Provide education on lifestyle modifcations including regular physical activity/exercise, weight management, moderate sodium restriction and increased consumption of fresh fruit, vegetables, and low fat dairy, alcohol moderation, and smoking cessation.;Monitor prescription use compliance.   Expected Outcomes Short Term: Continued assessment and intervention until BP is < 140/36mm HG in hypertensive participants. < 130/37mm HG in hypertensive participants with diabetes, heart failure or chronic kidney  disease.;Long Term: Maintenance of blood pressure at goal levels.   Lipids Yes   Intervention Provide education and support for participant on nutrition & aerobic/resistive exercise along with prescribed medications to achieve LDL 70mg , HDL >40mg .   Expected Outcomes Short Term: Participant states understanding of desired cholesterol values and is compliant with medications prescribed. Participant is following exercise prescription and nutrition guidelines.;Long Term: Cholesterol controlled with medications as prescribed, with individualized exercise RX and with personalized nutrition plan. Value goals: LDL < 70mg , HDL > 40 mg.   Personal Goal Other Yes   Personal Goal To go back to driving herself. Nakira would like to be able to physically pick up her 67 year old great grandchild.    Intervention Encourage her to exercise in Cardiac Rehab 2-3 times a week.    Expected Outcomes For Shaolin to be able to drive again. For Ghalia to be able to have the strength to physically pick up her 2 year  old grandchild.       Core Components/Risk Factors/Patient Goals Review:    Core Components/Risk Factors/Patient Goals at Discharge (Final Review):    ITP Comments:     ITP Comments    Row Name 01/12/16 1348 01/12/16 1352         ITP Comments ITP Created during Medical Review/Orientation Appt. Documentation of diagnosis in EPIC/CHL encounter 11/07/2015. Shakinah's daughter takes care of her now since Zionna's open heart surgery. Stress due to Citlalli's daughter is working full time etc. Shawndel would like to go back to driving herself. Elmo would like to be able to take care of and physically pick up her great granchildren.  Individual appt made for Anayat and her daughter with our Cardiac Rehab Registerd Dietician.          Comments: Initial ITP

## 2016-01-13 ENCOUNTER — Ambulatory Visit
Admission: RE | Admit: 2016-01-13 | Discharge: 2016-01-13 | Disposition: A | Discharge status: Home / Self Care | Payer: BLUE CROSS/BLUE SHIELD | Source: Ambulatory Visit | Attending: Neurology | Admitting: Neurology

## 2016-01-13 DIAGNOSIS — R410 Disorientation, unspecified: Secondary | ICD-10-CM

## 2016-01-13 DIAGNOSIS — R404 Transient alteration of awareness: Secondary | ICD-10-CM

## 2016-01-13 DIAGNOSIS — R9401 Abnormal electroencephalogram [EEG]: Secondary | ICD-10-CM

## 2016-01-13 DIAGNOSIS — R29898 Other symptoms and signs involving the musculoskeletal system: Secondary | ICD-10-CM

## 2016-01-14 ENCOUNTER — Telehealth: Payer: Self-pay

## 2016-01-14 NOTE — Telephone Encounter (Signed)
-----   Message from Pieter Partridge, DO sent at 01/14/2016  7:15 AM EDT ----- MRI shows some narrowing of the blood vessels in the head (due to history of high blood pressure, heart disease and stroke), but nothing severe enough that would explain the episodes of altered consciousness.  She should remain on aspirin daily.  At this point, I do not have a neurologic explanation for her spells.

## 2016-01-20 ENCOUNTER — Encounter: Payer: BLUE CROSS/BLUE SHIELD | Admitting: *Deleted

## 2016-01-20 DIAGNOSIS — Z951 Presence of aortocoronary bypass graft: Secondary | ICD-10-CM

## 2016-01-20 LAB — GLUCOSE, CAPILLARY
Glucose-Capillary: 122 mg/dL — ABNORMAL HIGH (ref 65–99)
Glucose-Capillary: 74 mg/dL (ref 65–99)

## 2016-01-20 NOTE — Progress Notes (Signed)
Daily Session Note  Patient Details  Name: Candace [name] MRN: [id number] Date of Birth: 1950/10/08 Referring Provider:   Flowsheet Row Cardiac Rehab from 01/12/2016 in [facility] Cardiac and Pulmonary Rehab  Referring Provider  [name]      Encounter Date: 01/20/2016  Check In:     Session Check In - 01/20/16 0844      Check-In   Location [facility]-Cardiac & Pulmonary Rehab   Staff Present Alberteen Sam, MA, ACSM RCEP, Exercise Physiologist;Susanne Bice, RN, BSN, CCRP;Laureen Owens Shark, BS, RRT, Respiratory Therapist;Other   Supervising physician immediately available to respond to emergencies See telemetry face sheet for immediately available ER MD   Medication changes reported     No   Fall or balance concerns reported    No   Warm-up and Cool-down Performed on first and last piece of equipment   Resistance Training Performed Yes   VAD Patient? No     Pain Assessment   Currently in Pain? No/denies   Multiple Pain Sites No         Goals Met:  Exercise tolerated well No report of cardiac concerns or symptoms Strength training completed today  Goals Unmet:  Not Applicable  Comments: Doing well with exercise prescription progression.    Dr. Emily Filbert is Medical Director for [facility] and [facility].

## 2016-01-20 NOTE — Progress Notes (Signed)
Daily Session Note  Patient Details  Name: Candace [name] MRN: [id number] Date of Birth: [birth year] Referring Provider:   Flowsheet Row Cardiac Rehab from 01/12/2016 in [facility] Cardiac and Pulmonary Rehab  Referring Provider  [name]      Encounter Date: 01/20/2016  Check In:     Session Check In - 01/20/16 0844      Check-In   Location [facility]-Cardiac & Pulmonary Rehab   Staff Present Alberteen Sam, MA, ACSM RCEP, Exercise Physiologist;Susanne Bice, RN, BSN, CCRP;Laureen Owens Shark, BS, RRT, Respiratory Therapist;Other   Supervising physician immediately available to respond to emergencies See telemetry face sheet for immediately available ER MD   Medication changes reported     No   Fall or balance concerns reported    No   Warm-up and Cool-down Performed on first and last piece of equipment   Resistance Training Performed Yes   VAD Patient? No     Pain Assessment   Currently in Pain? No/denies   Multiple Pain Sites No         Goals Met:  Exercise tolerated well Personal goals reviewed No report of cardiac concerns or symptoms Strength training completed today  Goals Unmet:  Not Applicable  Comments: First full day of exercise!  Patient was oriented to gym and equipment including functions, settings, policies, and procedures.  Patient's individual exercise prescription and treatment plan were reviewed.  All starting workloads were established based on the results of the 6 minute walk test done at initial orientation visit.  The plan for exercise progression was also introduced and progression will be customized based on patient's performance and goals.    Dr. Emily Filbert is Medical Director for [place] and [facility].

## 2016-01-22 NOTE — Telephone Encounter (Signed)
Message relayed to patient. Verbalized understanding and denied questions.   

## 2016-01-27 ENCOUNTER — Encounter: Payer: BLUE CROSS/BLUE SHIELD | Admitting: *Deleted

## 2016-01-27 DIAGNOSIS — Z951 Presence of aortocoronary bypass graft: Secondary | ICD-10-CM

## 2016-01-27 LAB — GLUCOSE, CAPILLARY
Glucose-Capillary: 150 mg/dL — ABNORMAL HIGH (ref 65–99)
Glucose-Capillary: 142 mg/dL — ABNORMAL HIGH (ref 65–99)

## 2016-01-27 NOTE — Progress Notes (Signed)
Daily Session Note  Patient Details  Name: Candace [name] MRN: [id number] Date of Birth: [birth year] Referring Provider:   Flowsheet Row Cardiac Rehab from 01/12/2016 in [facility] Cardiac and Pulmonary Rehab  Referring Provider  [name]      Encounter Date: 01/27/2016  Check In:     Session Check In - 01/27/16 0813      Check-In   Location [facility]-Cardiac & Pulmonary Rehab   Staff Present Alberteen Sam, MA, ACSM RCEP, Exercise Physiologist;Susanne Bice, RN, BSN, CCRP;Laureen Owens Shark, BS, RRT, Respiratory Therapist;Other   Supervising physician immediately available to respond to emergencies See telemetry face sheet for immediately available ER MD   Medication changes reported     Yes   Comments glipizide stopped   Fall or balance concerns reported    No   Warm-up and Cool-down Performed as group-led instruction   Resistance Training Performed Yes   VAD Patient? No     Pain Assessment   Currently in Pain? No/denies   Multiple Pain Sites No         Goals Met:  Independence with exercise equipment Exercise tolerated well No report of cardiac concerns or symptoms Strength training completed today  Goals Unmet:  Not Applicable  Comments: Pt able to follow exercise prescription today without complaint.  Will continue to monitor for progression.    Dr. Emily Filbert is Medical Director for [place] and [facility].

## 2016-01-29 ENCOUNTER — Encounter: Payer: BLUE CROSS/BLUE SHIELD | Attending: Cardiothoracic Surgery | Admitting: *Deleted

## 2016-01-29 DIAGNOSIS — Z79899 Other long term (current) drug therapy: Secondary | ICD-10-CM | POA: Insufficient documentation

## 2016-01-29 DIAGNOSIS — Z951 Presence of aortocoronary bypass graft: Secondary | ICD-10-CM | POA: Diagnosis not present

## 2016-01-29 LAB — GLUCOSE, CAPILLARY
Glucose-Capillary: 196 mg/dL — ABNORMAL HIGH (ref 65–99)
Glucose-Capillary: 105 mg/dL — ABNORMAL HIGH (ref 65–99)

## 2016-01-29 NOTE — Progress Notes (Signed)
Daily Session Note  Patient Details  Name: Candace [name] MRN: [id number] Date of Birth: 1950/10/15 Referring Provider:   Flowsheet Row Cardiac Rehab [facility]12[facility]2016 [facility]  Referring Provider  [name]      Encounter Date: 01/29/2016  Check In:     Session Check In - 01/29/16 0951      Check-In   Location [facility]-Cardiac & Pulmonary Rehab   Staff Present Alberteen Sam, MA, ACSM RCEP, Exercise Physiologist;Susanne Bice, RN, BSN, Lance Sell, BA, ACSM CEP, Exercise Physiologist;Other   Supervising physician immediately available to respond to emergencies See telemetry face sheet for immediately available ER MD   Medication changes reported     Yes   Comments metroprol decreased to 25 mg BID   Fall or balance concerns reported    No   Warm-up and Cool-down Performed on first and last piece of equipment   Resistance Training Performed Yes   VAD Patient? No     Pain Assessment   Currently in Pain? No/denies   Multiple Pain Sites No           Exercise Prescription Changes - 01/28/16 1400      Exercise Review   Progression Yes     Response to Exercise   Blood Pressure (Admit) 126/64   Blood Pressure (Exercise) 126/70   Blood Pressure (Exit) 98/58   Heart Rate (Admit) 77 bpm   Heart Rate (Exercise) 124 bpm   Heart Rate (Exit) 93 bpm   Rating of Perceived Exertion (Exercise) 13   Symptoms none   Duration Progress to 45 minutes of aerobic exercise without signs/symptoms of physical distress   Intensity THRR unchanged     Progression   Progression Continue to progress workloads to maintain intensity without signs/symptoms of physical distress.   Average METs 2.57     Resistance Training   Training Prescription Yes   Weight 2 lbs   Reps 10-12     Interval Training   Interval Training No     NuStep   Level 1   Minutes 15   METs 2.7     Biostep-RELP   Level 1   Minutes 15   METs 1     Track   Laps 35   Minutes 15   METs 2.61      Goals Met:  Independence with exercise equipment Exercise tolerated well No report of cardiac concerns or symptoms Strength training completed today  Goals Unmet:  Not Applicable  Comments: Pt able to follow exercise prescription today without complaint.  Will continue to monitor for progression. Reviewed home exercise with pt today.  Pt plans to walk, do stairs, and chair exercises at home for exercise.  Reviewed THR, pulse (needs practice), RPE, sign and symptoms, and when to call 911 or MD.  Also discussed weather considerations and indoor options.  Pt voiced understanding.    Dr. Emily Filbert is Medical Director for [place] and [facility].

## 2016-02-03 ENCOUNTER — Encounter: Payer: BLUE CROSS/BLUE SHIELD | Admitting: *Deleted

## 2016-02-03 DIAGNOSIS — Z951 Presence of aortocoronary bypass graft: Secondary | ICD-10-CM | POA: Diagnosis not present

## 2016-02-03 NOTE — Progress Notes (Signed)
Daily Session Note  Patient Details  Name: Candace [name] MRN: [id number] Date of Birth: 1950/04/12 Referring Provider:   Flowsheet Row Cardiac Rehab from 01/12/2016 in [facility] Cardiac and Pulmonary Rehab  Referring Provider  [name]      Encounter Date: 02/03/2016  Check In:     Session Check In - 02/03/16 0820      Check-In   Location [facility]-Cardiac & Pulmonary Rehab   Staff Present Alberteen Sam, MA, ACSM RCEP, Exercise Physiologist;Susanne Bice, RN, BSN, CCRP;Laureen Owens Shark, BS, RRT, Respiratory Therapist   Supervising physician immediately available to respond to emergencies See telemetry face sheet for immediately available ER MD   Medication changes reported     No   Fall or balance concerns reported    No   Warm-up and Cool-down Performed on first and last piece of equipment   Resistance Training Performed Yes   VAD Patient? No     Pain Assessment   Currently in Pain? No/denies   Multiple Pain Sites No         Goals Met:  Independence with exercise equipment Exercise tolerated well No report of cardiac concerns or symptoms Strength training completed today  Goals Unmet:  Not Applicable  Comments: Pt able to follow exercise prescription today without complaint.  Will continue to monitor for progression.    Dr. Emily Filbert is Medical Director for [place] and [facility].

## 2016-02-04 ENCOUNTER — Encounter: Payer: Self-pay | Admitting: *Deleted

## 2016-02-04 DIAGNOSIS — Z951 Presence of aortocoronary bypass graft: Secondary | ICD-10-CM

## 2016-02-04 NOTE — Progress Notes (Signed)
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 Term: Participant verbalizes understanding of the signs/symptoms and immediate care of hyper/hypoglycemia, proper foot care and importance of medication, aerobic/resistive exercise and nutrition plan for blood glucose control.;Long Term: Attainment of HbA1C < 7%.   Hypertension Yes   Intervention Provide education on lifestyle modifcations including regular physical activity/exercise, weight management, moderate sodium restriction and increased consumption of fresh fruit, vegetables, and low fat dairy, alcohol moderation, and smoking cessation.;Monitor prescription use compliance.   Expected Outcomes Short Term: Continued assessment and intervention until BP is < 140/945mHG in hypertensive participants. < 130/8055mG in hypertensive participants with diabetes, heart failure or chronic kidney disease.;Long Term: Maintenance of blood pressure at goal levels.   Lipids Yes   Intervention Provide education and support for participant on nutrition & aerobic/resistive exercise along with  prescribed medications to achieve LDL <39m73mDL >40mg39mExpected Outcomes Short Term: Participant states understanding of desired cholesterol values and is compliant with medications prescribed. Participant is following exercise prescription and nutrition guidelines.;Long Term: Cholesterol controlled with medications as prescribed, with individualized exercise RX and with personalized nutrition plan. Value goals: LDL < 39mg,33m > 40 mg.   Personal Goal Other Yes   Personal Goal To go back to driving herself. Shenell would like to be able to physically pick up her 2 year63old great grandchild.    Intervention Encourage her to exercise in Cardiac Rehab 2-3 times a week.    Expected Outcomes For Makaylah to be able to drive again. For Aahana to be able to have the strength to physically pick up her 2 year62old grandchild.       Core Components/Risk Factors/Patient Goals Review:      Goals and Risk Factor Review    Row Name 01/27/16 1014             Core Components/Risk Factors/Patient Goals Review   Personal Goals Review Stress       Review Elener really wants to be told that everything is going to  get better and that she could come off of her medications.  We talked about the realistic of coming off medication, but that she could feel better and get back to enjoying life and her great grandkids.       Expected Outcomes Saga will continue to come to class to work on strength and stamina.          Core Components/Risk Factors/Patient Goals at Discharge (Final Review):      Goals and Risk Factor Review - 01/27/16 1014      Core Components/Risk Factors/Patient Goals Review   Personal Goals Review Stress   Review Lianny really wants to be told that everything is going to get better and that she could come off of her medications.  We talked about the realistic of coming off medication, but that she could feel better and get back to enjoying life and her great grandkids.   Expected Outcomes Kathyrn will continue  to come to class to work on strength and stamina.      ITP Comments:     ITP Comments    Row Name 01/12/16 1348 01/12/16 1352 02/04/16 0708       ITP Comments ITP Created during Medical Review/Orientation Appt. Documentation of diagnosis in EPIC/CHL encounter 11/07/2015. Jenea'[name] daughter takes care of her now since Lynnae'[name] open heart surgery. Stress due to Celesta'[name] daughter is working full time etc. Chiante would like to go back to driving herself. Remedios would like to be able to take care of and physically pick up her great granchildren.  Individual appt made for Nijae and her daughter with our Cardiac Rehab Registerd Dietician.  30 day review completed for Medical Director physician review and signature. Continue ITP unless changes made by physician.        Comments:

## 2016-02-05 DIAGNOSIS — Z951 Presence of aortocoronary bypass graft: Secondary | ICD-10-CM

## 2016-02-05 NOTE — Progress Notes (Signed)
Daily Session Note  Patient Details  Name: Candace [name] MRN: [id number] Date of Birth: 1950-11-26 Referring Provider:   Flowsheet Row Cardiac Rehab from 01/12/2016 in [facility] Cardiac and Pulmonary Rehab  Referring Provider  [name]      Encounter Date: 02/05/2016  Check In:     Session Check In - 02/05/16 0848      Check-In   Location [facility]-Cardiac & Pulmonary Rehab   Staff Present Gerlene Burdock, RN, BSN;Jessica Luan Pulling, MA, ACSM RCEP, Exercise Physiologist;Amanda Oletta Darter, BA, ACSM CEP, Exercise Physiologist   Supervising physician immediately available to respond to emergencies See telemetry face sheet for immediately available ER MD   Medication changes reported     No   Fall or balance concerns reported    No   Warm-up and Cool-down Performed on first and last piece of equipment   Resistance Training Performed Yes   VAD Patient? No     Pain Assessment   Currently in Pain? No/denies   Multiple Pain Sites No         Goals Met:  Independence with exercise equipment Exercise tolerated well No report of cardiac concerns or symptoms Strength training completed today  Goals Unmet:  Not Applicable  Comments: Pt able to follow exercise prescription today without complaint.  Will continue to monitor for progression.    Dr. Emily Filbert is Medical Director for [place] and [facility].

## 2016-02-12 ENCOUNTER — Encounter: Payer: BLUE CROSS/BLUE SHIELD | Admitting: *Deleted

## 2016-02-12 DIAGNOSIS — Z951 Presence of aortocoronary bypass graft: Secondary | ICD-10-CM | POA: Diagnosis not present

## 2016-02-12 NOTE — Progress Notes (Signed)
Daily Session Note  Patient Details  Name: Candace [name] MRN: [id number] Date of Birth: September 19, 1950 Referring Provider:   Flowsheet Row Cardiac Rehab from 01/12/2016 in [facility] Cardiac and Pulmonary Rehab  Referring Provider  [name]      Encounter Date: 02/12/2016  Check In:     Session Check In - 02/12/16 0827      Check-In   Location [facility]-Cardiac & Pulmonary Rehab   Staff Present Alberteen Sam, MA, ACSM RCEP, Exercise Physiologist;Carroll Enterkin, RN, Vickki Hearing, BA, ACSM CEP, Exercise Physiologist;Other   Supervising physician immediately available to respond to emergencies See telemetry face sheet for immediately available ER MD   Medication changes reported     No   Fall or balance concerns reported    No   Warm-up and Cool-down Performed on first and last piece of equipment   Resistance Training Performed Yes   VAD Patient? No     Pain Assessment   Currently in Pain? No/denies   Multiple Pain Sites No           Exercise Prescription Changes - 02/11/16 1400      Exercise Review   Progression Yes     Response to Exercise   Blood Pressure (Admit) 122/60   Blood Pressure (Exercise) 126/70   Blood Pressure (Exit) 124/76   Heart Rate (Admit) 85 bpm   Heart Rate (Exercise) 134 bpm   Heart Rate (Exit) 83 bpm   Rating of Perceived Exertion (Exercise) 13   Symptoms none   Comments Home Exercise Guidelines given on 01/29/16   Duration Progress to 45 minutes of aerobic exercise without signs/symptoms of physical distress   Intensity THRR unchanged     Progression   Progression Continue to progress workloads to maintain intensity without signs/symptoms of physical distress.   Average METs 2.51     Resistance Training   Training Prescription Yes   Weight 2 lbs   Reps 10-12     Interval Training   Interval Training No     NuStep   Level 2   Minutes 15   METs 2.7     Biostep-RELP   Level 2   Minutes 15   METs 2     Track   Laps 40    Minutes 15   METs 2.84     Home Exercise Plan   Plans to continue exercise at Home  walking, stairs, chair exercises   Frequency Add 3 additional days to program exercise sessions.      Goals Met:  Independence with exercise equipment Exercise tolerated well No report of cardiac concerns or symptoms Strength training completed today  Goals Unmet:  Not Applicable  Comments: Pt able to follow exercise prescription today without complaint.  Will continue to monitor for progression.    Dr. Emily Filbert is Medical Director for [place] and [facility].

## 2016-02-13 ENCOUNTER — Ambulatory Visit (INDEPENDENT_AMBULATORY_CARE_PROVIDER_SITE_OTHER): Payer: BLUE CROSS/BLUE SHIELD | Admitting: Pulmonary Disease

## 2016-02-13 ENCOUNTER — Encounter: Payer: Self-pay | Admitting: Pulmonary Disease

## 2016-02-13 VITALS — BP 124/78 | HR 97 | Ht 63.0 in | Wt 165.0 lb

## 2016-02-13 DIAGNOSIS — J4520 Mild intermittent asthma, uncomplicated: Secondary | ICD-10-CM

## 2016-02-13 DIAGNOSIS — G4733 Obstructive sleep apnea (adult) (pediatric): Secondary | ICD-10-CM | POA: Diagnosis not present

## 2016-02-13 DIAGNOSIS — J449 Chronic obstructive pulmonary disease, unspecified: Secondary | ICD-10-CM | POA: Diagnosis not present

## 2016-02-13 DIAGNOSIS — R0683 Snoring: Secondary | ICD-10-CM | POA: Insufficient documentation

## 2016-02-13 LAB — PULMONARY FUNCTION TEST
FEF 25-75 Post: 2.43 L/sec
FEF 25-75 Pre: 1.37 L/sec
FEF2575-%Change-Post: 77 %
FEF2575-%Pred-Post: 133 %
FEF2575-%Pred-Pre: 75 %
FEV1-%Change-Post: 13 %
FEV1-%Pred-Post: 97 %
FEV1-%Pred-Pre: 85 %
FEV1-Post: 1.83 L
FEV1-Pre: 1.61 L
FEV1FVC-%Change-Post: 7 %
FEV1FVC-%Pred-Pre: 99 %
FEV6-%Change-Post: 4 %
FEV6-%Pred-Post: 93 %
FEV6-%Pred-Pre: 89 %
FEV6-Post: 2.18 L
FEV6-Pre: 2.08 L
FEV6FVC-%Change-Post: 0 %
FEV6FVC-%Pred-Post: 103 %
FEV6FVC-%Pred-Pre: 104 %
FVC-%Change-Post: 5 %
FVC-%Pred-Post: 90 %
FVC-%Pred-Pre: 86 %
FVC-Post: 2.19 L
FVC-Pre: 2.08 L
Post FEV1/FVC ratio: 83 %
Post FEV6/FVC ratio: 100 %
Pre FEV1/FVC ratio: 77 %
Pre FEV6/FVC Ratio: 100 %

## 2016-02-13 NOTE — Patient Instructions (Signed)
Use albuterol as needed only  call us if you have a bad case of bronchitis   You may have obstructive sleep apnea- schedule sleep study

## 2016-02-13 NOTE — Assessment & Plan Note (Signed)
Given excessive daytime somnolence, narrow pharyngeal exam, witnessed apneas & loud snoring, obstructive sleep apnea is very likely & an overnight polysomnogram will be scheduled as a split study. The pathophysiology of obstructive sleep apnea , it's cardiovascular consequences & modes of treatment including CPAP were discused with the patient in detail & they evidenced understanding.  

## 2016-02-13 NOTE — Assessment & Plan Note (Signed)
She had bronchospasm post surgery with hypercarbia-with spirometry showing moderate restriction. Spirometry is much improved now and does not show any airway obstruction or restriction, she does have significant bronchodilator response. I doubt a diagnosis of late onset asthma She will use albuterol on an as-needed basis only

## 2016-02-13 NOTE — Progress Notes (Signed)
   Subjective:    Patient ID: Candace [name], female    DOB: [birth year], 65 y.o.   MRN: [id number]  HPI  65 year old  remote smoker S/p urgent CABG 11/10/15 for 3V -CAD  She developed bronchospasm postoperatively that responded well to steroids. She was a remote smoker, less than half pack per day-less than 20 pack years. Spirometry in the hospital showed moderate restriction with FVC 44% and ratio 76. She had mild hypercarbia and initial ABG that resolved.  02/13/2016  Chief Complaint  Patient presents with  . Follow-up    Pt. states her breathing is doing well Pt. denies chest pain    She has done well since discharge. She completed home physical therapy and is now enrolled in cardiac rehabilitation. She has not needed albuterol since her last visit and denies wheezing. She continues to have dyspnea when working out in cardiac rehabilitation but this is improving and as her conditioning is getting better.  Daughter reports loud snoring and occasional gasping episodes and her sleep Epworth sleepiness score is 6. She reports frequent nocturnal awakenings. Bedtime is between 9 and 11 PM, sleep latency is 30 minutes, she reports 3-4 nocturnal awakenings and is out of bed at 6:30 AM feeling tired with occasional dryness of mouth denies headaches. Loud snoring has been noted by family members   CT chest 04/2014 shows no evidence of emphysema or fibrosis Spirometry 01/2016 FEV1 85%, Normal ratio, 13% improvement with bronchodilator to 97%  Past Medical History:  Diagnosis Date  . Anxiety   . Arrhythmia   . Arthritis   . Diabetes mellitus without complication ([place])   . Hyperlipidemia   . Hypertension   . Reflux   . Stroke [facility])      Review of Systems neg for any significant sore throat, dysphagia, itching, sneezing, nasal congestion or excess/ purulent secretions, fever, chills, sweats, unintended wt loss, pleuritic or exertional cp, hempoptysis, orthopnea pnd or change in  chronic leg swelling. Also denies presyncope, palpitations, heartburn, abdominal pain, nausea, vomiting, diarrhea or change in bowel or urinary habits, dysuria,hematuria, rash, arthralgias, visual complaints, headache, numbness weakness or ataxia.     Objective:   Physical Exam  Gen. Pleasant, obese, in no distress ENT - no lesions, no post nasal drip, class 2 airway Neck: No JVD, no thyromegaly, no carotid bruits Lungs: no use of accessory muscles, no dullness to percussion, decreased without rales or rhonchi  Cardiovascular: Rhythm regular, heart sounds  normal, no murmurs or gallops, no peripheral edema Musculoskeletal: No deformities, no cyanosis or clubbing , no tremors       Assessment & Plan:

## 2016-02-16 ENCOUNTER — Encounter: Payer: BLUE CROSS/BLUE SHIELD | Admitting: *Deleted

## 2016-02-16 DIAGNOSIS — Z951 Presence of aortocoronary bypass graft: Secondary | ICD-10-CM

## 2016-02-16 NOTE — Progress Notes (Signed)
Daily Session Note  Patient Details  Name: Candace [name] MRN: [id number] Date of Birth: 1951-01-02 Referring Provider:   Flowsheet Row Cardiac Rehab from 01/12/2016 in [facility] Cardiac and Pulmonary Rehab  Referring Provider  [name]      Encounter Date: 02/16/2016  Check In:     Session Check In - 02/16/16 1634      Check-In   Location [facility]-Cardiac & Pulmonary Rehab   Staff Present Heath Lark, RN, BSN, Laveda Norman, BS, ACSM CEP, Exercise Physiologist;Other   Supervising physician immediately available to respond to emergencies See telemetry face sheet for immediately available ER MD   Medication changes reported     No   Fall or balance concerns reported    No   Warm-up and Cool-down Performed on first and last piece of equipment   Resistance Training Performed Yes   VAD Patient? No     Pain Assessment   Currently in Pain? No/denies   Multiple Pain Sites No         Goals Met:  Independence with exercise equipment Exercise tolerated well No report of cardiac concerns or symptoms Strength training completed today  Goals Unmet:  Not Applicable  Comments: Pt able to follow exercise prescription today without complaint.  Will continue to monitor for progression.    Dr. Emily Filbert is Medical Director for [place] and [facility].

## 2016-02-17 ENCOUNTER — Encounter: Payer: BLUE CROSS/BLUE SHIELD | Admitting: *Deleted

## 2016-02-17 DIAGNOSIS — Z951 Presence of aortocoronary bypass graft: Secondary | ICD-10-CM

## 2016-02-17 NOTE — Progress Notes (Signed)
Daily Session Note  Patient Details  Name: Candace [name] MRN: [id number] Date of Birth: 04/06/1950 Referring Provider:   Flowsheet Row Cardiac Rehab from 01/12/2016 in [facility] Cardiac and Pulmonary Rehab  Referring Provider  [name]      Encounter Date: 02/17/2016  Check In:     Session Check In - 02/17/16 0827      Check-In   Location [facility]-Cardiac & Pulmonary Rehab   Staff Present Alberteen Sam, MA, ACSM RCEP, Exercise Physiologist;Susanne Bice, RN, BSN, CCRP;Laureen Owens Shark, BS, RRT, Respiratory Therapist   Supervising physician immediately available to respond to emergencies See telemetry face sheet for immediately available ER MD   Medication changes reported     No   Fall or balance concerns reported    No   Warm-up and Cool-down Performed as group-led instruction   Resistance Training Performed Yes   VAD Patient? No     Pain Assessment   Currently in Pain? No/denies   Multiple Pain Sites No         Goals Met:  Independence with exercise equipment Exercise tolerated well No report of cardiac concerns or symptoms Strength training completed today  Goals Unmet:  Not Applicable  Comments: Pt able to follow exercise prescription today without complaint.  Will continue to monitor for progression.    Dr. Emily Filbert is Medical Director for [place] and [facility].

## 2016-02-24 ENCOUNTER — Encounter: Payer: BLUE CROSS/BLUE SHIELD | Admitting: *Deleted

## 2016-02-24 DIAGNOSIS — Z951 Presence of aortocoronary bypass graft: Secondary | ICD-10-CM

## 2016-02-24 NOTE — Progress Notes (Signed)
Daily Session Note  Patient Details  Name: Candace [name] MRN: [id number] Date of Birth: March 20, 1951 Referring Provider:   Flowsheet Row Cardiac Rehab from 01/12/2016 in [facility] Cardiac and Pulmonary Rehab  Referring Provider  [name]      Encounter Date: 02/24/2016  Check In:     Session Check In - 02/24/16 0849      Check-In   Location [facility]-Cardiac & Pulmonary Rehab   Staff Present Alberteen Sam, MA, ACSM RCEP, Exercise Physiologist;Susanne Bice, RN, BSN, CCRP;Laureen Owens Shark, BS, RRT, Respiratory Therapist   Supervising physician immediately available to respond to emergencies See telemetry face sheet for immediately available ER MD   Medication changes reported     No   Fall or balance concerns reported    No   Warm-up and Cool-down Performed on first and last piece of equipment   Resistance Training Performed Yes     Pain Assessment   Currently in Pain? No/denies   Multiple Pain Sites No         Goals Met:  Independence with exercise equipment Exercise tolerated well No report of cardiac concerns or symptoms Strength training completed today  Goals Unmet:  Not Applicable  Comments: Pt able to follow exercise prescription today without complaint.  Will continue to monitor for progression.    Dr. Emily Filbert is Medical Director for [place] and [facility].

## 2016-02-26 ENCOUNTER — Ambulatory Visit (INDEPENDENT_AMBULATORY_CARE_PROVIDER_SITE_OTHER): Payer: BLUE CROSS/BLUE SHIELD | Admitting: Cardiothoracic Surgery

## 2016-02-26 ENCOUNTER — Encounter: Payer: Self-pay | Admitting: Cardiothoracic Surgery

## 2016-02-26 VITALS — BP 119/84 | HR 90 | Resp 20 | Ht 63.0 in | Wt 165.0 lb

## 2016-02-26 DIAGNOSIS — Z951 Presence of aortocoronary bypass graft: Secondary | ICD-10-CM

## 2016-02-26 NOTE — Progress Notes (Signed)
Daily Session Note  Patient Details  Name: Candace [name] MRN: [id number] Date of Birth: 12/23/1950 Referring Provider:   Flowsheet Row Cardiac Rehab from 01/12/2016 in [facility] Cardiac and Pulmonary Rehab  Referring Provider  Kowalski, Bruce MD      Encounter Date: 02/26/2016  Check In:     Session Check In - 02/26/16 0832      Check-In   Location [facility]-Cardiac & Pulmonary Rehab   Staff Present  , MA, ACSM RCEP, Exercise Physiologist;Amanda Sommer, BA, ACSM CEP, Exercise Physiologist;Other   Supervising physician immediately available to respond to emergencies See telemetry face sheet for immediately available ER MD   Medication changes reported     No   Fall or balance concerns reported    No   Warm-up and Cool-down Performed on first and last piece of equipment   Resistance Training Performed Yes   VAD Patient? No     Pain Assessment   Currently in Pain? No/denies   Multiple Pain Sites No            Exercise Prescription Changes - 02/25/16 1500      Exercise Review   Progression Yes     Response to Exercise   Blood Pressure (Admit) 128/78   Blood Pressure (Exercise) 120/64   Blood Pressure (Exit) 114/56   Heart Rate (Admit) 78 bpm   Heart Rate (Exercise) 116 bpm   Heart Rate (Exit) 96 bpm   Rating of Perceived Exertion (Exercise) 12   Symptoms none   Comments Home Exercise Guidelines given on 01/29/16   Duration Progress to 45 minutes of aerobic exercise without signs/symptoms of physical distress   Intensity THRR unchanged     Progression   Progression Continue to progress workloads to maintain intensity without signs/symptoms of physical distress.   Average METs 2.75     Resistance Training   Training Prescription Yes   Weight 3 lbs   Reps 10-12     Interval Training   Interval Training No     NuStep   Level 2   Minutes 15   METs 2.5     Biostep-RELP   Level 3   Minutes 15   METs 3     Track   Laps 50   Minutes 20   METs  2.76     Home Exercise Plan   Plans to continue exercise at Home  walking, stairs, chair exercises   Frequency Add 3 additional days to program exercise sessions.      Goals Met:  Independence with exercise equipment Exercise tolerated well Personal goals reviewed No report of cardiac concerns or symptoms Strength training completed today  Goals Unmet:  Not Applicable  Comments: Pt able to follow exercise prescription today without complaint.  Will continue to monitor for progression.    Dr. Mark Miller is Medical Director for [facility] and [facility]. 

## 2016-02-26 NOTE — Progress Notes (Signed)
[address]       [place] [address]             [phone]      Candace [name]  Medical Record [id number] Date of Birth: 08/12/1950  Referring: Corey Skains, MD Primary Care: [facility]  Chief Complaint:   POST OP FOLLOW UP  History of Present Illness:     11/10/2015 OPERATIVE REPORT PREOPERATIVE DIAGNOSIS:  Coronary occlusive disease with unstable angina. POSTOPERATIVE DIAGNOSIS:  Coronary occlusive disease with unstable angina. SURGICAL PROCEDURE:  Coronary artery bypass grafting x4 with the left internal mammary to the left anterior descending coronary artery, sequential reverse saphenous vein graft to the first and second obtuse marginal, reverse saphenous vein graft to the continuation branch of the right coronary artery with bilateral right and left greater saphenous vein harvesting. SURGEON:  Lanelle Bal, MD.  Patient returns office today after coronary artery bypass grafting done on August 14. She's been making reasonable progress at home  She denies any fevers. She is increasing her physical activity. She comes in the office today because of upper sternal incision discomfort and dysesthesias over the left chest.. She's had no popping or clicking of the sternum. She's had no angina  She completed in-home physical therapy and is now in cardiac rehabilitation at [facility] and progressing well.  Past Medical History:  Diagnosis Date  . Anxiety   . Arrhythmia   . Arthritis   . Diabetes mellitus without complication ([place])   . Hyperlipidemia   . Hypertension   . Reflux   . Stroke [facility])      History  Smoking Status  . Former Smoker  . Types: Cigarettes  . Quit date: 10/07/2001  Smokeless Tobacco  . Never Used    History  Alcohol Use No     Allergies  Allergen Reactions  . Penicillins Swelling, Rash and Other (See Comments)    UNSPECIFIED     Current Outpatient Prescriptions  Medication  Sig Dispense Refill  . albuterol (PROVENTIL HFA;VENTOLIN HFA) 108 (90 Base) MCG/ACT inhaler Inhale 2 puffs into the lungs every 6 (six) hours as needed for wheezing or shortness of breath. 1 Inhaler 6  . aspirin EC 325 MG EC tablet Take 1 tablet (325 mg total) by mouth daily.    . cetirizine (ZYRTEC) 10 MG tablet Take 10 mg by mouth daily.    . ferrous [id number] C-folic acid (TRINSICON / FOLTRIN) capsule Take 1 capsule by mouth 3 (three) times daily after meals. 90 capsule 1  . linagliptin (TRADJENTA) 5 MG TABS tablet Take 5 mg by mouth daily.    . metFORMIN (GLUCOPHAGE-XR) 750 MG 24 hr tablet Take 1 tablet (750 mg total) by mouth 2 (two) times daily. 60 tablet 1  . metoprolol tartrate (LOPRESSOR) 25 MG tablet Take 25 mg by mouth 2 (two) times daily.    Marland Kitchen oxyCODONE (OXY IR/ROXICODONE) 5 MG immediate release tablet Take 1 tablet (5 mg total) by mouth every 6 (six) hours as needed for severe pain. 30 tablet 0  . pantoprazole (PROTONIX) 40 MG tablet Take 40 mg by mouth daily.    . rosuvastatin (CRESTOR) 40 MG tablet Take 1 tablet (40 mg total) by mouth daily at 6 PM. 30 tablet 1  . sertraline (ZOLOFT) 50 MG tablet Take 50 mg by mouth daily.     No current facility-administered medications for this visit.        Physical Exam:  BP 119/84   Pulse 90   Resp 20   Ht 5\' 3"  (1.6 m)   Wt 165 lb (74.8 kg)   SpO2 98% Comment: RA  BMI 29.23 kg/m   General appearance: alert and cooperative Neurologic: intact Heart: regular rate and rhythm, S1, S2 normal, no murmur, click, rub or gallop Lungs: clear to auscultation bilaterally Abdomen: soft, non-tender; bowel sounds normal; no masses,  no organomegaly Extremities: extremities normal, atraumatic, no cyanosis or edema and Homans sign is negative, no sign of DVT Wound: Lower sternal incision has completely healed, the sternum is stable without popping or clicking, the patient has mild keloid formation at the upper part of her  incision   Diagnostic Studies & Laboratory data:     Recent Radiology Findings:   No results found.    Recent Lab Findings: Lab Results  Component Value Date   WBC 10.3 11/15/2015   HGB 8.2 (L) 11/15/2015   HCT 25.0 (L) 11/15/2015   PLT 173 11/15/2015   GLUCOSE 146 (H) 11/15/2015   CHOL 214 (H) 07/14/2013   TRIG 118 07/14/2013   HDL 36 (L) 07/14/2013   LDLCALC 154 (H) 07/14/2013   ALT 14 11/09/2015   AST 21 11/09/2015   NA 137 11/15/2015   K 3.9 11/15/2015   CL 101 11/15/2015   CREATININE 0.84 11/15/2015   BUN 14 11/15/2015   CO2 26 11/15/2015   TSH 0.763 01/23/2012   INR 1.44 11/10/2015   HGBA1C 8.2 (H) 11/09/2015      Assessment / Plan:      Patient stable following coronary artery bypass grafting with this a seizures over the left anterior chest 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 Reviewed with the patient the sensation of tenderness along keloid formation at this point I would not recommend any particular intervention if after 6-8 months after surgery she has bothered with keloid formation excision of the scar and revision with steroid injection could be considered  She will return to see me as needed   [name] MD      [address] [place] [address] Office [phone]   Beeper [phone]  02/26/2016 12:17 PM

## 2016-03-02 ENCOUNTER — Encounter: Payer: BLUE CROSS/BLUE SHIELD | Attending: Cardiothoracic Surgery | Admitting: Respiratory Therapy

## 2016-03-02 DIAGNOSIS — Z951 Presence of aortocoronary bypass graft: Secondary | ICD-10-CM | POA: Insufficient documentation

## 2016-03-02 DIAGNOSIS — Z79899 Other long term (current) drug therapy: Secondary | ICD-10-CM | POA: Insufficient documentation

## 2016-03-02 NOTE — Progress Notes (Signed)
Daily Session Note  Patient Details  Name: Candace [name] MRN: [id number] Date of Birth: [birth year] Referring Provider:   Flowsheet Row Cardiac Rehab from 01/12/2016 in [facility]  Referring Provider  [name]      Encounter Date: 03/02/2016  Check In:     Session Check In - 03/02/16 0856      Check-In   Location [facility]   Staff Present Alberteen Sam, MA, ACSM RCEP, Exercise Physiologist;Susanne Bice, RN, BSN, CCRP;Laureen Owens Shark, BS, RRT, Respiratory Therapist   Supervising physician immediately available to respond to emergencies See telemetry face sheet for immediately available ER MD   Medication changes reported     No   Fall or balance concerns reported    No   Warm-up and Cool-down Performed on first and last piece of equipment   Resistance Training Performed Yes   VAD Patient? No     Pain Assessment   Currently in Pain? No/denies   Multiple Pain Sites No         Goals Met:  Independence with exercise equipment Exercise tolerated well No report of cardiac concerns or symptoms Strength training completed today  Goals Unmet:  Not Applicable  Comments: Pt able to follow exercise prescription today without complaint.  Will continue to monitor for progression.    Dr. Emily Filbert is Medical Director for [place] and [facility].

## 2016-03-03 ENCOUNTER — Encounter: Payer: Self-pay | Admitting: *Deleted

## 2016-03-03 DIAGNOSIS — Z951 Presence of aortocoronary bypass graft: Secondary | ICD-10-CM

## 2016-03-03 NOTE — Progress Notes (Signed)
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      Diabetes: - Individual verbal and written instruction to review signs/symptoms of diabetes, desired ranges of glucose level fasting, after meals and with exercise. Advice that pre and post exercise glucose checks will be done for 3 sessions at entry of program. Flowsheet Row Cardiac Rehab from 03/02/2016 in [facility] Cardiac and Pulmonary Rehab  Date  01/12/16  Educator  Loletha Grayer ENterkinRN  Instruction Review Code  2- meets goals/outcomes       Knowledge Questionnaire Score:     Knowledge Questionnaire Score - 01/12/16 1347      Knowledge Questionnaire Score   Pre Score 20/24      Core Components/Risk Factors/Patient Goals at Admission:     Personal Goals and Risk Factors at Admission - 01/12/16 1353      Core Components/Risk Factors/Patient Goals on Admission    Weight Management Yes;Obesity;Weight Loss   Intervention Weight Management: Develop a combined nutrition and exercise program designed to reach desired caloric intake, while maintaining appropriate intake of nutrient and  fiber, sodium and fats,  and appropriate energy expenditure required for the weight goal.;Weight Management: Provide education and appropriate resources to help participant work on and attain dietary goals.;Weight Management/Obesity: Establish reasonable short term and long term weight goals.;Obesity: Provide education and appropriate resources to help participant work on and attain dietary goals.   Admit Weight 168 lb 11.2 oz (76.5 kg)   Goal Weight: Short Term 165 lb (74.8 kg)   Goal Weight: Long Term 158 lb (71.7 kg)   Expected Outcomes Short Term: Continue to assess and modify interventions until short term weight is achieved;Long Term: Adherence to nutrition and physical activity/exercise program aimed toward attainment of established weight goal;Weight Loss: Understanding of general recommendations for a balanced deficit meal plan, which promotes 1-2 lb weight loss per week and includes a negative energy balance of [phone] kcal/d;Understanding of distribution of calorie intake throughout the day with the consumption of 4-5 meals/snacks;Understanding recommendations for meals to include 15-35% energy as protein, 25-35% energy from fat, 35-60% energy from carbohydrates, less than '200mg'$  of dietary cholesterol, 20-35 gm of total fiber daily   Sedentary Yes   Intervention Provide advice, education, support and counseling about physical activity/exercise needs.;Develop an individualized exercise prescription for aerobic and resistive training based on initial evaluation findings, risk stratification, comorbidities and participant's personal goals.   Expected Outcomes Achievement of increased cardiorespiratory fitness and enhanced flexibility, muscular endurance and strength shown through measurements of functional capacity and personal statement of participant.   Increase Strength and Stamina Yes   Intervention Provide advice, education, support and counseling about physical activity/exercise needs.;Develop  an individualized exercise prescription for aerobic and resistive training based on initial evaluation findings, risk stratification, comorbidities and participant's personal goals.   Expected Outcomes Achievement of increased cardiorespiratory fitness and enhanced flexibility, muscular endurance and strength shown through measurements of functional capacity and personal statement of participant.   Diabetes Yes   Intervention Provide education about signs/symptoms and action to take for hypo/hyperglycemia.;Provide education about proper nutrition, including hydration, and aerobic/resistive exercise prescription along with prescribed medications to achieve blood glucose in normal ranges: Fasting glucose 65-99 mg/dL   Expected Outcomes Short Term: Participant verbalizes understanding of the signs/symptoms and immediate care of hyper/hypoglycemia, proper foot care and importance of medication, aerobic/resistive exercise and nutrition plan for blood glucose control.;Long Term: Attainment of HbA1C < 7%.   Hypertension Yes   Intervention Provide education on lifestyle modifcations including regular physical activity/exercise, weight management, moderate sodium restriction and increased consumption of fresh fruit, vegetables, and low fat dairy, alcohol moderation, and smoking cessation.;Monitor prescription use compliance.   Expected Outcomes Short Term: Continued assessment and intervention until BP is < 140/13m HG in hypertensive participants. < 130/858mHG in hypertensive participants with diabetes, heart failure or chronic kidney disease.;Long Term: Maintenance of blood pressure at goal levels.   Lipids Yes   Intervention Provide education and support for participant on nutrition & aerobic/resistive exercise along with prescribed medications to achieve LDL '70mg'$ , HDL >'40mg'$ .   Expected Outcomes Short Term: Participant states understanding of desired cholesterol values and is compliant with medications  prescribed. Participant is following exercise prescription and nutrition guidelines.;Long Term: Cholesterol controlled with medications as prescribed, with individualized exercise RX and with personalized nutrition plan. Value goals: LDL < '70mg'$ , HDL > 40 mg.   Personal Goal Other Yes   Personal Goal To go back to driving herself. Daris would like to be able to physically pick up her 2 42ear old great grandchild.    Intervention Encourage her to exercise in  Cardiac Rehab 2-3 times a week.    Expected Outcomes For Jetaime to be able to drive again. For Jori to be able to have the strength to physically pick up her 81 year old grandchild.       Core Components/Risk Factors/Patient Goals Review:      Goals and Risk Factor Review    Row Name 01/27/16 1014 02/26/16 1031           Core Components/Risk Factors/Patient Goals Review   Personal Goals Review Stress Weight Management/Obesity;Sedentary;Increase Strength and Stamina;Diabetes;Hypertension;Lipids;Stress      Review Jasiya really wants to be told that everything is going to get better and that she could come off of her medications.  We talked about the realistic of coming off medication, but that she could feel better and get back to enjoying life and her great grandkids. Shavelle has been doing well in rehab.  She feels that she is getting stronger and able to get out and do more.  She is enjoying being able to play with her grandkids again.  Her weight (166-170), blood pressure, and blood sugars have all been good.  She has had some incisonal soreness and redness and plans to see the surgeon today.      Expected Outcomes Evalee will continue to come to class to work on strength and stamina. Delorise will continue to come to class for strength, stamina, and risk factor modification.         Core Components/Risk Factors/Patient Goals at Discharge (Final Review):      Goals and Risk Factor Review - 02/26/16 1031      Core Components/Risk Factors/Patient Goals  Review   Personal Goals Review Weight Management/Obesity;Sedentary;Increase Strength and Stamina;Diabetes;Hypertension;Lipids;Stress   Review Twala has been doing well in rehab.  She feels that she is getting stronger and able to get out and do more.  She is enjoying being able to play with her grandkids again.  Her weight (166-170), blood pressure, and blood sugars have all been good.  She has had some incisonal soreness and redness and plans to see the surgeon today.   Expected Outcomes Zehra will continue to come to class for strength, stamina, and risk factor modification.      ITP Comments:     ITP Comments    Row Name 01/12/16 1348 01/12/16 1352 02/04/16 0708 03/03/16 0604     ITP Comments ITP Created during Medical Review/Orientation Appt. Documentation of diagnosis in EPIC/CHL encounter 11/07/2015. Lashea's daughter takes care of her now since Kaeli's open heart surgery. Stress due to Creola's daughter is working full time etc. Lativia would like to go back to driving herself. Ashika would like to be able to take care of and physically pick up her great granchildren.  Individual appt made for Marieli and her daughter with our Cardiac Rehab Registerd Dietician.  30 day review completed for Medical Director physician review and signature. Continue ITP unless changes made by physician. 30 day review completed for review by Dr Emily Filbert.  Continue with ITP unless changes noted by Dr [name] Heck.       Comments:

## 2016-03-04 ENCOUNTER — Telehealth: Payer: Self-pay | Admitting: *Deleted

## 2016-03-04 NOTE — Telephone Encounter (Signed)
Candace [name] called to let us know that she was going to be out today.  She has a cold and a fever.

## 2016-03-09 ENCOUNTER — Encounter: Payer: BLUE CROSS/BLUE SHIELD | Admitting: Respiratory Therapy

## 2016-03-09 DIAGNOSIS — Z951 Presence of aortocoronary bypass graft: Secondary | ICD-10-CM

## 2016-03-09 NOTE — Progress Notes (Signed)
Daily Session Note  Patient Details  Name: Candace [name] MRN: [id number] Date of Birth: 25-Mar-1951 Referring Provider:   Flowsheet Row Cardiac Rehab from 01/12/2016 in [facility] Cardiac and Pulmonary Rehab  Referring Provider  [name]      Encounter Date: 03/09/2016  Check In:     Session Check In - 03/09/16 0833      Check-In   Location [facility]-Cardiac & Pulmonary Rehab   Staff Present Alberteen Sam, MA, ACSM RCEP, Exercise Physiologist;Susanne Bice, RN, BSN, CCRP;Laureen Owens Shark, BS, RRT, Respiratory Therapist   Supervising physician immediately available to respond to emergencies See telemetry face sheet for immediately available ER MD   Medication changes reported     No   Fall or balance concerns reported    No   Warm-up and Cool-down Performed on first and last piece of equipment   Resistance Training Performed Yes   VAD Patient? No     Pain Assessment   Currently in Pain? No/denies   Multiple Pain Sites No         Goals Met:  Proper associated with RPD/PD & O2 Sat Independence with exercise equipment Exercise tolerated well No report of cardiac concerns or symptoms Strength training completed today  Goals Unmet:  Not Applicable  Comments: Pt able to follow exercise prescription today without complaint.  Will continue to monitor for progression.   Dr. Emily Filbert is Medical Director for [place] and [facility].

## 2016-03-16 ENCOUNTER — Encounter: Payer: BLUE CROSS/BLUE SHIELD | Admitting: Respiratory Therapy

## 2016-03-16 DIAGNOSIS — Z951 Presence of aortocoronary bypass graft: Secondary | ICD-10-CM | POA: Diagnosis not present

## 2016-03-16 NOTE — Progress Notes (Signed)
Daily Session Note  Patient Details  Name: Candace [name] MRN: [id number] Date of Birth: [birth year] Referring Provider:   Flowsheet Row Cardiac Rehab from 01/12/2016 in [facility] Cardiac and Pulmonary Rehab  Referring Provider  [name]      Encounter Date: 03/16/2016  Check In:     Session Check In - 03/16/16 0820      Check-In   Location [facility]-Cardiac & Pulmonary Rehab   Staff Present Alberteen Sam, MA, ACSM RCEP, Exercise Physiologist;Susanne Bice, RN, BSN, CCRP;Laureen Owens Shark, BS, RRT, Respiratory Therapist   Supervising physician immediately available to respond to emergencies See telemetry face sheet for immediately available ER MD   Medication changes reported     No   Fall or balance concerns reported    No   Warm-up and Cool-down Performed on first and last piece of equipment   Resistance Training Performed Yes   VAD Patient? No     Pain Assessment   Currently in Pain? No/denies   Multiple Pain Sites No         Goals Met:  Independence with exercise equipment Exercise tolerated well No report of cardiac concerns or symptoms Strength training completed today  Goals Unmet:  Not Applicable  Comments: Pt able to follow exercise prescription today without complaint.  Will continue to monitor for progression.    Dr. Emily Filbert is Medical Director for [place] and [facility].

## 2016-03-18 DIAGNOSIS — Z951 Presence of aortocoronary bypass graft: Secondary | ICD-10-CM | POA: Diagnosis not present

## 2016-03-18 NOTE — Progress Notes (Signed)
Daily Session Note  Patient Details  Name: Candace [name] MRN: [id number] Date of Birth: 10-29-1950 Referring Provider:   Flowsheet Row Cardiac Rehab from 01/12/2016 in [facility] Cardiac and Pulmonary Rehab  Referring Provider  Candace Royals MD      Encounter Date: 03/18/2016  Check In:     Session Check In - 03/18/16 0850      Check-In   Location [facility]-Cardiac & Pulmonary Rehab   Staff Present Alberteen Sam, MA, ACSM RCEP, Exercise Physiologist;Amanda Oletta Darter, BA, ACSM CEP, Exercise Physiologist;Patricia Surles RN BSN   Supervising physician immediately available to respond to emergencies See telemetry face sheet for immediately available ER MD   Medication changes reported     No   Fall or balance concerns reported    No   Warm-up and Cool-down Performed on first and last piece of equipment   Resistance Training Performed Yes   VAD Patient? No     VAD patient   Has back up controller? No     Pain Assessment   Currently in Pain? No/denies         Goals Met:  Independence with exercise equipment Exercise tolerated well No report of cardiac concerns or symptoms Strength training completed today  Goals Unmet:  Not Applicable  Comments: Pt able to follow exercise prescription today without complaint.  Will continue to monitor for progression.    Dr. Emily Filbert is Medical Director for [place] and [facility].

## 2016-03-23 ENCOUNTER — Encounter: Payer: BLUE CROSS/BLUE SHIELD | Admitting: Respiratory Therapy

## 2016-03-23 DIAGNOSIS — Z951 Presence of aortocoronary bypass graft: Secondary | ICD-10-CM

## 2016-03-23 NOTE — Progress Notes (Signed)
Daily Session Note  Patient Details  Name: Candace [name] MRN: [id number] Date of Birth: Nov 16, 1950 Referring Provider:   Flowsheet Row Cardiac Rehab from 01/12/2016 in [facility] Cardiac and Pulmonary Rehab  Referring Provider  [name]      Encounter Date: 03/23/2016  Check In:     Session Check In - 03/23/16 0955      Check-In   Location [facility]-Cardiac & Pulmonary Rehab   Staff Present Alberteen Sam, MA, ACSM RCEP, Exercise Physiologist;Susanne Bice, RN, BSN, CCRP;Laureen Owens Shark, BS, RRT, Respiratory Therapist   Supervising physician immediately available to respond to emergencies See telemetry face sheet for immediately available ER MD   Medication changes reported     No   Fall or balance concerns reported    No   Warm-up and Cool-down Performed on first and last piece of equipment   Resistance Training Performed Yes   VAD Patient? No     Pain Assessment   Currently in Pain? No/denies   Multiple Pain Sites No         Goals Met:  Proper associated with RPD/PD & O2 Sat Independence with exercise equipment Exercise tolerated well No report of cardiac concerns or symptoms Strength training completed today  Goals Unmet:  Not Applicable  Comments: Pt able to follow exercise prescription today without complaint.  Will continue to monitor for progression.   Dr. Emily Filbert is Medical Director for [place] and [facility].

## 2016-03-25 ENCOUNTER — Encounter: Payer: BLUE CROSS/BLUE SHIELD | Admitting: *Deleted

## 2016-03-25 DIAGNOSIS — Z951 Presence of aortocoronary bypass graft: Secondary | ICD-10-CM | POA: Diagnosis not present

## 2016-03-25 NOTE — Progress Notes (Signed)
Daily Session Note  Patient Details  Name: Candace [name] MRN: [id number] Date of Birth: Aug 26, 1950 Referring Provider:   Flowsheet Row Cardiac Rehab from 01/12/2016 in [facility] Cardiac and Pulmonary Rehab  Referring Provider  [name]      Encounter Date: 03/25/2016  Check In:     Session Check In - 03/25/16 1002      Check-In   Location [facility]-Cardiac & Pulmonary Rehab   Staff Present Heath Lark, RN, BSN, CCRP;Jessica Rondo, [place], ACSM RCEP, Exercise Physiologist;Cherity Blickenstaff RN BSN   Supervising physician immediately available to respond to emergencies See telemetry face sheet for immediately available ER MD   Medication changes reported     No   Fall or balance concerns reported    No   Warm-up and Cool-down Performed on first and last piece of equipment   Resistance Training Performed Yes   VAD Patient? No     Pain Assessment   Currently in Pain? No/denies   Multiple Pain Sites No         Goals Met:  Proper associated with RPD/PD & O2 Sat Independence with exercise equipment Exercise tolerated well No report of cardiac concerns or symptoms Strength training completed today  Goals Unmet:  Not Applicable  Comments: Pt able to follow exercise prescription today without complaint.  Will continue to monitor for progression.    Dr. Emily Filbert is Medical Director for [place] and [facility].

## 2016-03-30 ENCOUNTER — Encounter: Payer: Self-pay | Admitting: *Deleted

## 2016-03-30 ENCOUNTER — Encounter: Payer: BLUE CROSS/BLUE SHIELD | Attending: Cardiothoracic Surgery | Admitting: Respiratory Therapy

## 2016-03-30 VITALS — Ht 62.8 in | Wt 169.9 lb

## 2016-03-30 DIAGNOSIS — Z79899 Other long term (current) drug therapy: Secondary | ICD-10-CM | POA: Insufficient documentation

## 2016-03-30 DIAGNOSIS — Z951 Presence of aortocoronary bypass graft: Secondary | ICD-10-CM | POA: Diagnosis not present

## 2016-03-30 NOTE — Progress Notes (Signed)
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 health risks of elevated stress, cause of high stress, and healthy ways to reduce stress.   Depression: - Provides group verbal and written instruction on the correlation between heart/lung disease and depressed mood, treatment options, and the stigmas associated with seeking treatment. Flowsheet Row Cardiac Rehab from 03/18/2016 in [facility] Cardiac and Pulmonary Rehab  Date  02/12/16  Educator  CE  Instruction Review Code  2- meets goals/outcomes      Anatomy & Physiology of the Heart: - Group verbal and written instruction and models provide basic cardiac anatomy and physiology, with the coronary electrical and arterial systems. Review of: AMI, Angina, Valve disease, Heart Failure, Cardiac Arrhythmia, Pacemakers, and the ICD. Flowsheet Row Cardiac Rehab from 03/18/2016 in [facility] Cardiac and Pulmonary Rehab  Date  03/02/16  Educator  SB  Instruction Review Code  2- meets goals/outcomes      Cardiac Procedures: - Group verbal and written instruction and models to describe the testing methods done to diagnose  heart disease. Reviews the outcomes of the test results. Describes the treatment choices: Medical Management, Angioplasty, or Coronary Bypass Surgery. Flowsheet Row Cardiac Rehab from 03/18/2016 in [facility] Cardiac and Pulmonary Rehab  Date  03/09/16  Educator  SB  Instruction Review Code  2- meets goals/outcomes      Cardiac Medications: - Group verbal and written instruction to review commonly prescribed medications for heart disease. Reviews the medication, class of the drug, and side effects. Includes the steps to properly store meds and maintain the prescription regimen. Flowsheet Row Cardiac Rehab from 03/18/2016 in [facility] Cardiac and Pulmonary Rehab  Date  03/18/16 Marisue Humble 2]  Educator  TS  Instruction Review Code  2- meets goals/outcomes      Go Sex-Intimacy & Heart Disease, Get SMART - Goal Setting: - Group verbal and written instruction through game format to discuss heart disease and the return to sexual intimacy. Provides group verbal and written material to discuss and apply goal setting through the application of the [name].M.A.R.T. Method. Flowsheet Row Cardiac Rehab from 03/18/2016 in [facility] Cardiac and Pulmonary Rehab  Date  03/09/16  Educator  SB  Instruction Review Code  2- meets goals/outcomes      Other Matters of the Heart: - Provides group verbal, written materials and models to describe Heart Failure, Angina, Valve Disease, and Diabetes in the realm of heart disease. Includes description of the disease process and treatment options available to the cardiac patient.   Exercise & Equipment Safety: - Individual verbal instruction and demonstration of equipment use and safety with use of the equipment. Flowsheet Row Cardiac Rehab from 03/18/2016 in [facility] Cardiac and Pulmonary Rehab  Date  01/12/16  Educator  C. EnterkinRN  Instruction Review Code  2- meets goals/outcomes      Infection Prevention: - Provides verbal and written material to individual with discussion of  infection control including proper hand washing and proper equipment cleaning during exercise session. Flowsheet Row Cardiac Rehab from 03/18/2016 in [facility] Cardiac and Pulmonary Rehab  Date  01/12/16  Educator  C. EnterkinRN  Instruction Review Code  2- meets goals/outcomes      Falls Prevention: - Provides verbal and written material to individual with discussion of falls prevention and safety. Flowsheet Row Cardiac Rehab from 03/18/2016 in [facility] Cardiac and Pulmonary Rehab  Date  01/12/16  Educator  Cindy Hazy,  RN  Instruction Review Code  2- meets goals/outcomes      Diabetes: - Individual verbal and written instruction to review signs/symptoms of diabetes, desired ranges of glucose level fasting, after meals and with exercise. Advice that pre and post exercise glucose checks will be done for 3 sessions at entry of program. Flowsheet Row Cardiac Rehab from 03/18/2016 in [facility] Cardiac and Pulmonary Rehab  Date  01/12/16  Educator  Loletha Grayer ENterkinRN  Instruction Review Code  2- meets goals/outcomes       Knowledge Questionnaire Score:     Knowledge Questionnaire Score - 03/16/16 1109      Knowledge Questionnaire Score   Pre Score 20/24   Post Score 23/28      Core Components/Risk Factors/Patient Goals at Admission:     Personal Goals and Risk Factors at Admission - 01/12/16 1353      Core Components/Risk Factors/Patient Goals on Admission    Weight Management Yes;Obesity;Weight Loss   Intervention Weight Management: Develop a combined nutrition and exercise program designed to reach desired caloric intake, while maintaining appropriate intake of nutrient and fiber, sodium and fats, and appropriate energy expenditure required for the weight goal.;Weight Management: Provide education and appropriate resources to help participant work on and attain dietary goals.;Weight Management/Obesity: Establish reasonable short term and long term weight goals.;Obesity: Provide education and  appropriate resources to help participant work on and attain dietary goals.   Admit Weight 168 lb 11.2 oz (76.5 kg)   Goal Weight: Short Term 165 lb (74.8 kg)   Goal Weight: Long Term 158 lb (71.7 kg)   Expected Outcomes Short Term: Continue to assess and modify interventions until short term weight is achieved;Long Term: Adherence to nutrition and physical activity/exercise program aimed toward attainment of established weight goal;Weight Loss: Understanding of general recommendations for a balanced deficit meal plan, which promotes 1-2 lb weight loss per week and includes a negative energy balance of [phone] kcal/d;Understanding of distribution of calorie intake throughout the day with the consumption of 4-5 meals/snacks;Understanding recommendations for meals to include 15-35% energy as protein, 25-35% energy from fat, 35-60% energy from carbohydrates, less than 265m of dietary cholesterol, 20-35 gm of total fiber daily   Sedentary Yes   Intervention Provide advice, education, support and counseling about physical activity/exercise needs.;Develop an individualized exercise prescription for aerobic and resistive training based on initial evaluation findings, risk stratification, comorbidities and participant'[name] personal goals.   Expected Outcomes Achievement of increased cardiorespiratory fitness and enhanced flexibility, muscular endurance and strength shown through measurements of functional capacity and personal statement of participant.   Increase Strength and Stamina Yes   Intervention Provide advice, education, support and counseling about physical activity/exercise needs.;Develop an individualized exercise prescription for aerobic and resistive training based on initial evaluation findings, risk stratification, comorbidities and participant'[name] personal goals.   Expected Outcomes Achievement of increased cardiorespiratory fitness and enhanced flexibility, muscular endurance and strength shown  through measurements of functional capacity and personal statement of participant.   Diabetes Yes   Intervention Provide education about signs/symptoms and action to take for hypo/hyperglycemia.;Provide education about proper nutrition, including hydration, and aerobic/resistive exercise prescription along with prescribed medications to achieve blood glucose in normal ranges: Fasting glucose 65-99 mg/dL   Expected Outcomes Short Term: Participant verbalizes understanding of the signs/symptoms and immediate care of hyper/hypoglycemia, proper foot care and importance of medication, aerobic/resistive exercise and nutrition plan for blood glucose control.;Long Term: Attainment of HbA1C < 7%.   Hypertension Yes   Intervention Provide education  on lifestyle modifcations including regular physical activity/exercise, weight management, moderate sodium restriction and increased consumption of fresh fruit, vegetables, and low fat dairy, alcohol moderation, and smoking cessation.;Monitor prescription use compliance.   Expected Outcomes Short Term: Continued assessment and intervention until BP is < 140/58m HG in hypertensive participants. < 130/832mHG in hypertensive participants with diabetes, heart failure or chronic kidney disease.;Long Term: Maintenance of blood pressure at goal levels.   Lipids Yes   Intervention Provide education and support for participant on nutrition & aerobic/resistive exercise along with prescribed medications to achieve LDL <7080mHDL >57m65m Expected Outcomes Short Term: Participant states understanding of desired cholesterol values and is compliant with medications prescribed. Participant is following exercise prescription and nutrition guidelines.;Long Term: Cholesterol controlled with medications as prescribed, with individualized exercise RX and with personalized nutrition plan. Value goals: LDL < 70mg19mL > 40 mg.   Personal Goal Other Yes   Personal Goal To go back to driving  herself. Jayana would like to be able to physically pick up her 2 yea67 old great grandchild.    Intervention Encourage her to exercise in Cardiac Rehab 2-3 times a week.    Expected Outcomes For Kya to be able to drive again. For Charnette to be able to have the strength to physically pick up her 2 yea53 old grandchild.       Core Components/Risk Factors/Patient Goals Review:      Goals and Risk Factor Review    Row Name 01/27/16 1014 02/26/16 1031 03/18/16 0952         Core Components/Risk Factors/Patient Goals Review   Personal Goals Review Stress Weight Management/Obesity;Sedentary;Increase Strength and Stamina;Diabetes;Hypertension;Lipids;Stress Weight Management/Obesity;Sedentary;Increase Strength and Stamina;Diabetes;Hypertension;Lipids;Stress     Review Candace [name] really wants to be told that everything is going to get better and that she could come off of her medications.  We talked about the realistic of coming off medication, but that she could feel better and get back to enjoying life and her great grandkids. Candace [name] doing well in rehab.  She feels that she is getting stronger and able to get out and do more.  She is enjoying being able to play with her grandkids again.  Her weight (166-170), blood pressure, and blood sugars have all been good.  She has had some incisonal soreness and redness and plans to see the surgeon today. Candace [name] continues to do well in rehab.  She continues to get stronger and feel better.  Her incision is starting to heal more.  She continues to play with her grandkids.  Weight, blood pressure, and sugars have been good. She is doing well with her medications.  She is now hoping to be able to go back to work after graduation.     Expected Outcomes Candace wMercedies continue to come to class to work on strength and stamina. Bera will continue to come to class for strength, stamina, and risk factor modification. Mehlani wZareya continue to work on her strength and stamina to be able to get back to  work.        Core Components/Risk Factors/Patient Goals at Discharge (Final Review):      Goals and Risk Factor Review - 03/18/16 0952      Core Components/Risk Factors/Patient Goals Review   Personal Goals Review Weight Management/Obesity;Sedentary;Increase Strength and Stamina;Diabetes;Hypertension;Lipids;Stress   Review Evalyne cZamzaminues to do well in rehab.  She continues to get stronger and feel better.  Her incision is starting to heal more.  She continues to play with her grandkids.  Weight, blood pressure, and sugars have been good. She is doing well with her medications.  She is now hoping to be able to go back to work after graduation.   Expected Outcomes Amarie will continue to work on her strength and stamina to be able to get back to work.      ITP Comments:     ITP Comments    Row Name 01/12/16 1348 01/12/16 1352 02/04/16 0708 03/03/16 0604 03/30/16 0626   ITP Comments ITP Created during Medical Review/Orientation Appt. Documentation of diagnosis in EPIC/CHL encounter 11/07/2015. Rosann'[name] daughter takes care of her now since Cigi'[name] open heart surgery. Stress due to Derriana'[name] daughter is working full time etc. Jentri would like to go back to driving herself. Jabree would like to be able to take care of and physically pick up her great granchildren.  Individual appt made for Jendayi and her daughter with our Cardiac Rehab Registerd Dietician.  30 day review completed for Medical Director physician review and signature. Continue ITP unless changes made by physician. 30 day review completed for review by Dr Emily Filbert.  Continue with ITP unless changes noted by Dr [name] Heck. 30 day review. Continue with ITP unless directed changes per Medical Director review.      Comments:

## 2016-03-30 NOTE — Progress Notes (Addendum)
Daily Session Note  Patient Details  Name: Candace [name] MRN: [id number] Date of Birth: [birth year] Referring Provider:   Flowsheet Row Cardiac Rehab from 01/12/2016 in [facility] Cardiac and Pulmonary Rehab  Referring Provider  [name]      Encounter Date: 03/30/2016  Check In:     Session Check In - 03/30/16 0909      Check-In   Location [facility]-Cardiac & Pulmonary Rehab   Staff Present Alberteen Sam, MA, ACSM RCEP, Exercise Physiologist;Susanne Bice, RN, BSN, CCRP;Laureen Owens Shark, BS, RRT, Respiratory Therapist   Supervising physician immediately available to respond to emergencies See telemetry face sheet for immediately available ER MD   Medication changes reported     No   Fall or balance concerns reported    No   Warm-up and Cool-down Performed on first and last piece of equipment   Resistance Training Performed Yes   VAD Patient? No     VAD patient   Has back up controller? No     Pain Assessment   Currently in Pain? No/denies   Multiple Pain Sites No         Goals Met:  Proper associated with RPD/PD & O2 Sat Independence with exercise equipment Exercise tolerated well No report of cardiac concerns or symptoms Strength training completed today  Goals Unmet:  Not Applicable  Comments: Pt able to follow exercise prescription today without complaint.  Will continue to monitor for progression.     Kraemer Name 01/12/16 1526 03/30/16 1019       6 Minute Walk   Phase Initial Discharge    Distance 835 feet 1213 feet    Distance % Change  -- 45.2 %  378 ft    Walk Time 6 minutes 6 minutes    # of Rest Breaks 0 0    MPH 1.58 2.3    METS 2.26 3.18    RPE 12 14    VO2 Peak 7.91 11.14    Symptoms  -- Yes (comment)    Comments incisional pain 6/10, fatigue (gait changed as she got more fatigued) incisional pain 6/10, fatigue     Resting HR 86 bpm 89 bpm    Resting BP 126/64 108/56    Max Ex. HR 110 bpm 129 bpm    Max Ex. BP 126/74  136/74    2 Minute Post BP 116/64  --         Dr. Emily Filbert is Medical Director for [place] and [facility].

## 2016-04-01 DIAGNOSIS — Z951 Presence of aortocoronary bypass graft: Secondary | ICD-10-CM

## 2016-04-01 NOTE — Progress Notes (Signed)
Daily Session Note  Patient Details  Name: Candace [name] MRN: [id number] Date of Birth: May 28, 1950 Referring Provider:   Flowsheet Row Cardiac Rehab from 01/12/2016 in [facility] Cardiac and Pulmonary Rehab  Referring Provider  [name]      Encounter Date: 04/01/2016  Check In:     Session Check In - 04/01/16 1132      Check-In   Location [facility]-Cardiac & Pulmonary Rehab   Staff Present Alberteen Sam, MA, ACSM RCEP, Exercise Physiologist;Patricia Surles RN Vickki Hearing, BA, ACSM CEP, Exercise Physiologist   Supervising physician immediately available to respond to emergencies See telemetry face sheet for immediately available ER MD   Medication changes reported     No   Fall or balance concerns reported    No   Warm-up and Cool-down Performed on first and last piece of equipment   Resistance Training Performed Yes   VAD Patient? No     VAD patient   Has back up controller? No     Pain Assessment   Currently in Pain? No/denies   Multiple Pain Sites No         Goals Met:  Independence with exercise equipment Exercise tolerated well No report of cardiac concerns or symptoms Strength training completed today  Goals Unmet:  Not Applicable  Comments: Pt able to follow exercise prescription today without complaint.  Will continue to monitor for progression.    Dr. Emily Filbert is Medical Director for [place] and [facility].

## 2016-04-06 ENCOUNTER — Encounter: Payer: BLUE CROSS/BLUE SHIELD | Admitting: *Deleted

## 2016-04-06 DIAGNOSIS — Z951 Presence of aortocoronary bypass graft: Secondary | ICD-10-CM

## 2016-04-06 NOTE — Patient Instructions (Signed)
Discharge Instructions  Patient Details  Name: Candace [name] MRN: [id number] Date of Birth: [birth year] Referring Provider:  [name], MD  [facility]!!  Keep on Dancing through [facility]!!  Number of Visits: 36  Reason for Discharge:  Patient reached a stable level of exercise. Patient independent in their exercise.  Smoking History:  History  Smoking Status  . Former Smoker  . Types: Cigarettes  . Quit date: 10/07/2001  Smokeless Tobacco  . Never Used    Diagnosis:  S/P CABG x 4  Initial Exercise Prescription:     Initial Exercise Prescription - 01/12/16 1500      Date of Initial Exercise RX and Referring Provider   Date 01/12/16   Referring Provider [name]     NuStep   Level 1   Minutes 15   METs 2     Biostep-RELP   Level 1   Minutes 15   METs 2     Track   Laps 15   Minutes 15   METs 1.7     Prescription Details   Frequency (times per week) 3   Duration Progress to 45 minutes of aerobic exercise without signs/symptoms of physical distress     Intensity   THRR 40-80% of Max Heartrate 114-142   Ratings of Perceived Exertion 11-15   Perceived Dyspnea 0-4     Progression   Progression Continue to progress workloads to maintain intensity without signs/symptoms of physical distress.     Resistance Training   Training Prescription Yes   Weight 2 lbs   Reps 10-12      Discharge Exercise Prescription (Final Exercise Prescription Changes):     Exercise Prescription Changes - 04/06/16 1500      Exercise Review   Progression Yes     Response to Exercise   Blood Pressure (Admit) 120/64   Blood Pressure (Exercise) 180/98   Blood Pressure (Exit) 126/72   Heart Rate (Admit) 80 bpm   Heart Rate (Exercise) 126 bpm   Heart Rate (Exit) 79 bpm   Rating of Perceived Exertion (Exercise) 13   Symptoms none   Comments Home Exercise Guidelines given on 01/29/16   Duration Progress to 45 minutes of aerobic exercise without  signs/symptoms of physical distress   Intensity THRR unchanged     Progression   Progression Continue to progress workloads to maintain intensity without signs/symptoms of physical distress.   Average METs 3.04     Resistance Training   Training Prescription Yes   Weight 3 lbs   Reps 10-12     Interval Training   Interval Training No     NuStep   Level 3   Minutes 15   METs 3.1     Biostep-RELP   Level 6   Minutes 15   METs 3     Track   Laps 50   Minutes 15   METs 3.03     Home Exercise Plan   Plans to continue exercise at Home  walking, stairs, chair exercises   Frequency Add 3 additional days to program exercise sessions.      Functional Capacity:     6 Minute Walk    Row Name 01/12/16 1526 03/30/16 1019       6 Minute Walk   Phase Initial Discharge    Distance 835 feet 1213 feet    Distance % Change  - 45.2 %  378 ft    Walk Time 6 minutes 6 minutes    #  of Rest Breaks 0 0    MPH 1.58 2.3    METS 2.26 3.18    RPE 12 14    VO2 Peak 7.91 11.14    Symptoms  - Yes (comment)    Comments incisional pain 6/10, fatigue (gait changed as she got more fatigued) incisional pain 6/10, fatigue     Resting HR 86 bpm 89 bpm    Resting BP 126/64 108/56    Max Ex. HR 110 bpm 129 bpm    Max Ex. BP 126/74 136/74    2 Minute Post BP 116/64  -       Quality of Life:     Quality of Life - 03/16/16 1109      Quality of Life Scores   Health/Function Pre 9.83 %   Health/Function Post 19.27 %   Health/Function % Change 96.03 %   Socioeconomic Pre 13.75 %   Socioeconomic Post 19.64 %   Socioeconomic % Change  42.84 %   Psych/Spiritual Pre 22.71 %   Psych/Spiritual Post 24.86 %   Psych/Spiritual % Change 9.47 %   Family Pre 18.3 %   Family Post 23.63 %   Family % Change 29.13 %   GLOBAL Pre 14.5 %   GLOBAL Post 21.18 %   GLOBAL % Change 46.07 %      Personal Goals: Goals established at orientation with interventions provided to work toward goal.      Personal Goals and Risk Factors at Admission - 01/12/16 1353      Core Components/Risk Factors/Patient Goals on Admission    Weight Management Yes;Obesity;Weight Loss   Intervention Weight Management: Develop a combined nutrition and exercise program designed to reach desired caloric intake, while maintaining appropriate intake of nutrient and fiber, sodium and fats, and appropriate energy expenditure required for the weight goal.;Weight Management: Provide education and appropriate resources to help participant work on and attain dietary goals.;Weight Management/Obesity: Establish reasonable short term and long term weight goals.;Obesity: Provide education and appropriate resources to help participant work on and attain dietary goals.   Admit Weight 168 lb 11.2 oz (76.5 kg)   Goal Weight: Short Term 165 lb (74.8 kg)   Goal Weight: Long Term 158 lb (71.7 kg)   Expected Outcomes Short Term: Continue to assess and modify interventions until short term weight is achieved;Long Term: Adherence to nutrition and physical activity/exercise program aimed toward attainment of established weight goal;Weight Loss: Understanding of general recommendations for a balanced deficit meal plan, which promotes 1-2 lb weight loss per week and includes a negative energy balance of [phone] kcal/d;Understanding of distribution of calorie intake throughout the day with the consumption of 4-5 meals/snacks;Understanding recommendations for meals to include 15-35% energy as protein, 25-35% energy from fat, 35-60% energy from carbohydrates, less than 200mg  of dietary cholesterol, 20-35 gm of total fiber daily   Sedentary Yes   Intervention Provide advice, education, support and counseling about physical activity/exercise needs.;Develop an individualized exercise prescription for aerobic and resistive training based on initial evaluation findings, risk stratification, comorbidities and participant's personal goals.   Expected  Outcomes Achievement of increased cardiorespiratory fitness and enhanced flexibility, muscular endurance and strength shown through measurements of functional capacity and personal statement of participant.   Increase Strength and Stamina Yes   Intervention Provide advice, education, support and counseling about physical activity/exercise needs.;Develop an individualized exercise prescription for aerobic and resistive training based on initial evaluation findings, risk stratification, comorbidities and participant's personal goals.   Expected Outcomes Achievement  of increased cardiorespiratory fitness and enhanced flexibility, muscular endurance and strength shown through measurements of functional capacity and personal statement of participant.   Diabetes Yes   Intervention Provide education about signs/symptoms and action to take for hypo/hyperglycemia.;Provide education about proper nutrition, including hydration, and aerobic/resistive exercise prescription along with prescribed medications to achieve blood glucose in normal ranges: Fasting glucose 65-99 mg/dL   Expected Outcomes Short Term: Participant verbalizes understanding of the signs/symptoms and immediate care of hyper/hypoglycemia, proper foot care and importance of medication, aerobic/resistive exercise and nutrition plan for blood glucose control.;Long Term: Attainment of HbA1C < 7%.   Hypertension Yes   Intervention Provide education on lifestyle modifcations including regular physical activity/exercise, weight management, moderate sodium restriction and increased consumption of fresh fruit, vegetables, and low fat dairy, alcohol moderation, and smoking cessation.;Monitor prescription use compliance.   Expected Outcomes Short Term: Continued assessment and intervention until BP is < 140/56mm HG in hypertensive participants. < 130/18mm HG in hypertensive participants with diabetes, heart failure or chronic kidney disease.;Long Term:  Maintenance of blood pressure at goal levels.   Lipids Yes   Intervention Provide education and support for participant on nutrition & aerobic/resistive exercise along with prescribed medications to achieve LDL 70mg , HDL >40mg .   Expected Outcomes Short Term: Participant states understanding of desired cholesterol values and is compliant with medications prescribed. Participant is following exercise prescription and nutrition guidelines.;Long Term: Cholesterol controlled with medications as prescribed, with individualized exercise RX and with personalized nutrition plan. Value goals: LDL < 70mg , HDL > 40 mg.   Personal Goal Other Yes   Personal Goal To go back to driving herself. [place] would like to be able to physically pick up her 43 year old great grandchild.    Intervention Encourage her to exercise in Cardiac Rehab 2-3 times a week.    Expected Outcomes For Makenlee to be able to drive again. For [place]a to be able to have the strength to physically pick up her 13 year old grandchild.        Personal Goals Discharge:     Goals and Risk Factor Review - 03/18/16 0952      Core Components/Risk Factors/Patient Goals Review   Personal Goals Review Weight Management/Obesity;Sedentary;Increase Strength and Stamina;Diabetes;Hypertension;Lipids;Stress   Review Zavannah continues to do well in rehab.  She continues to get stronger and feel better.  Her incision is starting to heal more.  She continues to play with her grandkids.  Weight, blood pressure, and sugars have been good. She is doing well with her medications.  She is now hoping to be able to go back to work after graduation.   Expected Outcomes Calvina will continue to work on her strength and stamina to be able to get back to work.      Nutrition & Weight - Outcomes:     Pre Biometrics - 01/12/16 1532      Pre Biometrics   Height 5' 2.8" (1.595 m)   Weight 168 lb 11.2 oz (76.5 kg)   Waist Circumference 39 inches   Hip Circumference 41 inches    Waist to Hip Ratio 0.95 %   BMI (Calculated) 30.1   Single Leg Stand 1.37 seconds         Post Biometrics - 03/30/16 1021       Post  Biometrics   Height 5' 2.8" (1.595 m)   Weight 169 lb 14.4 oz (77.1 kg)   Waist Circumference 39 inches   Hip Circumference 41 inches  Waist to Hip Ratio 0.95 %   BMI (Calculated) 30.4   Single Leg Stand 5.39 seconds      Nutrition:     Nutrition Therapy & Goals - 01/30/16 1158      Nutrition Therapy   Diet Instructed on a meal plan including heart healthy and diabetes dietary guidelines.  Patient accompanied by her daughter came for nutrition counseling session.   Protein (specify units) 6   Fiber 20 grams   Whole Grain Foods 3 servings   Saturated Fats 10 max. grams   Fruits and Vegetables 5 servings/day   Sodium 1500 grams     Personal Nutrition Goals   Personal Goal #1 To consistently eat at least 2 meals per day with eventual goal of 3 meals per day spaced 4-5 hours apart. Stressed importance of consistent meal pattern to help prevent low blood sugar.   Personal Goal #2 Increase protein foods with goal of 6 oz. per day. Refer to list   Personal Goal #3 Eat a bedtime snack that includes a protein and a starch serving.   Personal Goal #4 Increase intake of fruits and vegetables.     Intervention Plan   Intervention Prescribe, educate and counsel regarding individualized specific dietary modifications aiming towards targeted core components such as weight, hypertension, lipid management, diabetes, heart failure and other comorbidities.;Nutrition handout(s) given to patient.   Expected Outcomes Short Term Goal: Understand basic principles of dietary content, such as calories, fat, sodium, cholesterol and nutrients.;Short Term Goal: A plan has been developed with personal nutrition goals set during dietitian appointment.;Long Term Goal: Adherence to prescribed nutrition plan.      Nutrition Discharge:     Nutrition Assessments -  03/16/16 1109      Rate Your Plate Scores   Pre Score 57   Pre Score % 63 %   Post Score 71   Post Score % 78.9 %   % Change 15.9 %      Education Questionnaire Score:     Knowledge Questionnaire Score - 03/16/16 1109      Knowledge Questionnaire Score   Pre Score 20/24   Post Score 23/28      Goals reviewed with patient; copy given to patient.

## 2016-04-06 NOTE — Progress Notes (Signed)
Daily Session Note  Patient Details  Name: Candace [name] MRN: [id number] Date of Birth: 1950/08/09 Referring Provider:   Flowsheet Row Cardiac Rehab from 01/12/2016 in [facility]  Referring Provider  [name]      Encounter Date: 04/06/2016  Check In:     Session Check In - 04/06/16 0814      Check-In   Location [facility]-Cardiac & Pulmonary Rehab   Staff Present Alberteen Sam, MA, ACSM RCEP, Exercise Physiologist;Susanne Bice, RN, BSN, CCRP;Laureen Owens Shark, BS, RRT, Respiratory Therapist   Supervising physician immediately available to respond to emergencies See telemetry face sheet for immediately available ER MD   Medication changes reported     No   Fall or balance concerns reported    No   Warm-up and Cool-down Performed on first and last piece of equipment   Resistance Training Performed Yes   VAD Patient? No     VAD patient   Has back up controller? No     Pain Assessment   Currently in Pain? No/denies   Multiple Pain Sites No         Goals Met:  Independence with exercise equipment Exercise tolerated well No report of cardiac concerns or symptoms Strength training completed today  Goals Unmet:  Not Applicable  Comments:  Pt able to follow exercise prescription today without complaint.  Will continue to monitor for progression.     Dr. Emily Filbert is Medical Director for [facility] and [facility].

## 2016-04-08 DIAGNOSIS — Z951 Presence of aortocoronary bypass graft: Secondary | ICD-10-CM | POA: Diagnosis not present

## 2016-04-08 NOTE — Progress Notes (Signed)
Discharge Summary  Patient Details  Name: Candace [name] MRN: [id number] Date of Birth: May 02, 1950 Referring Provider:   Flowsheet Row Cardiac Rehab from 01/12/2016 in [facility]  Referring Provider  [name]       Number of Visits: 36  Reason for Discharge:  Patient reached a stable level of exercise. Patient independent in their exercise.  Smoking History:  History  Smoking Status  . Former Smoker  . Types: Cigarettes  . Quit date: 10/07/2001  Smokeless Tobacco  . Never Used    Diagnosis:  S/P CABG x 4  ADL UCSD:   Initial Exercise Prescription:     Initial Exercise Prescription - 01/12/16 1500      Date of Initial Exercise RX and Referring Provider   Date 01/12/16   Referring Provider [name]     NuStep   Level 1   Minutes 15   METs 2     Biostep-RELP   Level 1   Minutes 15   METs 2     Track   Laps 15   Minutes 15   METs 1.7     Prescription Details   Frequency (times per week) 3   Duration Progress to 45 minutes of aerobic exercise without signs/symptoms of physical distress     Intensity   THRR 40-80% of Max Heartrate 114-142   Ratings of Perceived Exertion 11-15   Perceived Dyspnea 0-4     Progression   Progression Continue to progress workloads to maintain intensity without signs/symptoms of physical distress.     Resistance Training   Training Prescription Yes   Weight 2 lbs   Reps 10-12      Discharge Exercise Prescription (Final Exercise Prescription Changes):     Exercise Prescription Changes - 04/06/16 1500      Exercise Review   Progression Yes     Response to Exercise   Blood Pressure (Admit) 120/64   Blood Pressure (Exercise) 180/98   Blood Pressure (Exit) 126/72   Heart Rate (Admit) 80 bpm   Heart Rate (Exercise) 126 bpm   Heart Rate (Exit) 79 bpm   Rating of Perceived Exertion (Exercise) 13   Symptoms none   Comments Home Exercise Guidelines given on 01/29/16    Duration Progress to 45 minutes of aerobic exercise without signs/symptoms of physical distress   Intensity THRR unchanged     Progression   Progression Continue to progress workloads to maintain intensity without signs/symptoms of physical distress.   Average METs 3.04     Resistance Training   Training Prescription Yes   Weight 3 lbs   Reps 10-12     Interval Training   Interval Training No     NuStep   Level 3   Minutes 15   METs 3.1     Biostep-RELP   Level 6   Minutes 15   METs 3     Track   Laps 50   Minutes 15   METs 3.03     Home Exercise Plan   Plans to continue exercise at Home  walking, stairs, chair exercises   Frequency Add 3 additional days to program exercise sessions.      Functional Capacity:     6 Minute Walk    Row Name 01/12/16 1526 03/30/16 1019       6 Minute Walk   Phase Initial Discharge    Distance 835 feet 1213 feet    Distance % Change  -  45.2 %  378 ft    Walk Time 6 minutes 6 minutes    # of Rest Breaks 0 0    MPH 1.58 2.3    METS 2.26 3.18    RPE 12 14    VO2 Peak 7.91 11.14    Symptoms  - Yes (comment)    Comments incisional pain 6/10, fatigue (gait changed as she got more fatigued) incisional pain 6/10, fatigue     Resting HR 86 bpm 89 bpm    Resting BP 126/64 108/56    Max Ex. HR 110 bpm 129 bpm    Max Ex. BP 126/74 136/74    2 Minute Post BP 116/64  -       Psychological, QOL, Others - Outcomes: PHQ 2/9: Depression screen [facility] 2/9 03/16/2016 01/12/2016  Decreased Interest 1 2  Down, Depressed, Hopeless 1 2  PHQ - 2 Score 2 4  Altered sleeping 0 3  Tired, decreased energy 1 1  Change in appetite 1 3  Feeling bad or failure about yourself  0 1  Trouble concentrating 0 0  Moving slowly or fidgety/restless 0 1  Suicidal thoughts 0 0  PHQ-9 Score 4 13  Difficult doing work/chores Not difficult at all Not difficult at all    Quality of Life:     Quality of Life - 03/16/16 1109      Quality of Life Scores    Health/Function Pre 9.83 %   Health/Function Post 19.27 %   Health/Function % Change 96.03 %   Socioeconomic Pre 13.75 %   Socioeconomic Post 19.64 %   Socioeconomic % Change  42.84 %   Psych/Spiritual Pre 22.71 %   Psych/Spiritual Post 24.86 %   Psych/Spiritual % Change 9.47 %   Family Pre 18.3 %   Family Post 23.63 %   Family % Change 29.13 %   GLOBAL Pre 14.5 %   GLOBAL Post 21.18 %   GLOBAL % Change 46.07 %      Personal Goals: Goals established at orientation with interventions provided to work toward goal.     Personal Goals and Risk Factors at Admission - 01/12/16 1353      Core Components/Risk Factors/Patient Goals on Admission    Weight Management Yes;Obesity;Weight Loss   Intervention Weight Management: Develop a combined nutrition and exercise program designed to reach desired caloric intake, while maintaining appropriate intake of nutrient and fiber, sodium and fats, and appropriate energy expenditure required for the weight goal.;Weight Management: Provide education and appropriate resources to help participant work on and attain dietary goals.;Weight Management/Obesity: Establish reasonable short term and long term weight goals.;Obesity: Provide education and appropriate resources to help participant work on and attain dietary goals.   Admit Weight 168 lb 11.2 oz (76.5 kg)   Goal Weight: Short Term 165 lb (74.8 kg)   Goal Weight: Long Term 158 lb (71.7 kg)   Expected Outcomes Short Term: Continue to assess and modify interventions until short term weight is achieved;Long Term: Adherence to nutrition and physical activity/exercise program aimed toward attainment of established weight goal;Weight Loss: Understanding of general recommendations for a balanced deficit meal plan, which promotes 1-2 lb weight loss per week and includes a negative energy balance of [phone] kcal/d;Understanding of distribution of calorie intake throughout the day with the consumption of 4-5  meals/snacks;Understanding recommendations for meals to include 15-35% energy as protein, 25-35% energy from fat, 35-60% energy from carbohydrates, less than 200mg  of dietary cholesterol, 20-35 gm of total  fiber daily   Sedentary Yes   Intervention Provide advice, education, support and counseling about physical activity/exercise needs.;Develop an individualized exercise prescription for aerobic and resistive training based on initial evaluation findings, risk stratification, comorbidities and participant's personal goals.   Expected Outcomes Achievement of increased cardiorespiratory fitness and enhanced flexibility, muscular endurance and strength shown through measurements of functional capacity and personal statement of participant.   Increase Strength and Stamina Yes   Intervention Provide advice, education, support and counseling about physical activity/exercise needs.;Develop an individualized exercise prescription for aerobic and resistive training based on initial evaluation findings, risk stratification, comorbidities and participant's personal goals.   Expected Outcomes Achievement of increased cardiorespiratory fitness and enhanced flexibility, muscular endurance and strength shown through measurements of functional capacity and personal statement of participant.   Diabetes Yes   Intervention Provide education about signs/symptoms and action to take for hypo/hyperglycemia.;Provide education about proper nutrition, including hydration, and aerobic/resistive exercise prescription along with prescribed medications to achieve blood glucose in normal ranges: Fasting glucose 65-99 mg/dL   Expected Outcomes Short Term: Participant verbalizes understanding of the signs/symptoms and immediate care of hyper/hypoglycemia, proper foot care and importance of medication, aerobic/resistive exercise and nutrition plan for blood glucose control.;Long Term: Attainment of HbA1C < 7%.   Hypertension Yes    Intervention Provide education on lifestyle modifcations including regular physical activity/exercise, weight management, moderate sodium restriction and increased consumption of fresh fruit, vegetables, and low fat dairy, alcohol moderation, and smoking cessation.;Monitor prescription use compliance.   Expected Outcomes Short Term: Continued assessment and intervention until BP is < 140/28mm HG in hypertensive participants. < 130/47mm HG in hypertensive participants with diabetes, heart failure or chronic kidney disease.;Long Term: Maintenance of blood pressure at goal levels.   Lipids Yes   Intervention Provide education and support for participant on nutrition & aerobic/resistive exercise along with prescribed medications to achieve LDL 70mg , HDL >40mg .   Expected Outcomes Short Term: Participant states understanding of desired cholesterol values and is compliant with medications prescribed. Participant is following exercise prescription and nutrition guidelines.;Long Term: Cholesterol controlled with medications as prescribed, with individualized exercise RX and with personalized nutrition plan. Value goals: LDL < 70mg , HDL > 40 mg.   Personal Goal Other Yes   Personal Goal To go back to driving herself. Talibah would like to be able to physically pick up her 14 year old great grandchild.    Intervention Encourage her to exercise in Cardiac Rehab 2-3 times a week.    Expected Outcomes For Avanthika to be able to drive again. For Sareen to be able to have the strength to physically pick up her 65 year old grandchild.        Personal Goals Discharge:     Goals and Risk Factor Review    Row Name 01/27/16 1014 02/26/16 1031 03/18/16 0952         Core Components/Risk Factors/Patient Goals Review   Personal Goals Review Stress Weight Management/Obesity;Sedentary;Increase Strength and Stamina;Diabetes;Hypertension;Lipids;Stress Weight Management/Obesity;Sedentary;Increase Strength and  Stamina;Diabetes;Hypertension;Lipids;Stress     Review Sereniti really wants to be told that everything is going to get better and that she could come off of her medications.  We talked about the realistic of coming off medication, but that she could feel better and get back to enjoying life and her great grandkids. Krislyn has been doing well in rehab.  She feels that she is getting stronger and able to get out and do more.  She is enjoying being able to play  with her grandkids again.  Her weight (166-170), blood pressure, and blood sugars have all been good.  She has had some incisonal soreness and redness and plans to see the surgeon today. Kameren continues to do well in rehab.  She continues to get stronger and feel better.  Her incision is starting to heal more.  She continues to play with her grandkids.  Weight, blood pressure, and sugars have been good. She is doing well with her medications.  She is now hoping to be able to go back to work after graduation.     Expected Outcomes Gerldine will continue to come to class to work on strength and stamina. Talor will continue to come to class for strength, stamina, and risk factor modification. Dutchess will continue to work on her strength and stamina to be able to get back to work.        Nutrition & Weight - Outcomes:     Pre Biometrics - 01/12/16 1532      Pre Biometrics   Height 5' 2.8" (1.595 m)   Weight 168 lb 11.2 oz (76.5 kg)   Waist Circumference 39 inches   Hip Circumference 41 inches   Waist to Hip Ratio 0.95 %   BMI (Calculated) 30.1   Single Leg Stand 1.37 seconds         Post Biometrics - 03/30/16 1021       Post  Biometrics   Height 5' 2.8" (1.595 m)   Weight 169 lb 14.4 oz (77.1 kg)   Waist Circumference 39 inches   Hip Circumference 41 inches   Waist to Hip Ratio 0.95 %   BMI (Calculated) 30.4   Single Leg Stand 5.39 seconds      Nutrition:     Nutrition Therapy & Goals - 01/30/16 1158      Nutrition Therapy   Diet Instructed on  a meal plan including heart healthy and diabetes dietary guidelines.  Patient accompanied by her daughter came for nutrition counseling session.   Protein (specify units) 6   Fiber 20 grams   Whole Grain Foods 3 servings   Saturated Fats 10 max. grams   Fruits and Vegetables 5 servings/day   Sodium 1500 grams     Personal Nutrition Goals   Personal Goal #1 To consistently eat at least 2 meals per day with eventual goal of 3 meals per day spaced 4-5 hours apart. Stressed importance of consistent meal pattern to help prevent low blood sugar.   Personal Goal #2 Increase protein foods with goal of 6 oz. per day. Refer to list   Personal Goal #3 Eat a bedtime snack that includes a protein and a starch serving.   Personal Goal #4 Increase intake of fruits and vegetables.     Intervention Plan   Intervention Prescribe, educate and counsel regarding individualized specific dietary modifications aiming towards targeted core components such as weight, hypertension, lipid management, diabetes, heart failure and other comorbidities.;Nutrition handout(s) given to patient.   Expected Outcomes Short Term Goal: Understand basic principles of dietary content, such as calories, fat, sodium, cholesterol and nutrients.;Short Term Goal: A plan has been developed with personal nutrition goals set during dietitian appointment.;Long Term Goal: Adherence to prescribed nutrition plan.      Nutrition Discharge:     Nutrition Assessments - 03/16/16 1109      Rate Your Plate Scores   Pre Score 57   Pre Score % 63 %   Post Score 71   Post Score %  78.9 %   % Change 15.9 %      Education Questionnaire Score:     Knowledge Questionnaire Score - 03/16/16 1109      Knowledge Questionnaire Score   Pre Score 20/24   Post Score 23/28      Goals reviewed with patient; copy given to patient.

## 2016-04-08 NOTE — Progress Notes (Signed)
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 utilization of the food label. Flowsheet Row Cardiac Rehab from 04/08/2016 in [facility] Cardiac and Pulmonary Rehab  Date  02/16/16  Educator  PI  Instruction Review Code  2- meets goals/outcomes      Exercise Physiology & Risk Factors: - Group verbal and written instruction with models to review the exercise physiology of the cardiovascular system and associated critical values. Details cardiovascular disease risk factors and the goals associated with each risk factor. Flowsheet Row Cardiac Rehab from 04/08/2016 in [facility] Cardiac and Pulmonary Rehab  Date  02/05/16  Educator  [facility]  Instruction Review Code  2- meets goals/outcomes      Aerobic Exercise & Resistance Training: - Gives group verbal and written discussion on the health impact of inactivity. On the components of aerobic and resistive training programs and the benefits of this training and how to safely progress through these programs. Flowsheet Row Cardiac Rehab from 04/08/2016 in [facility] Cardiac and Pulmonary Rehab  Date   02/24/16  Educator  [facility]  Instruction Review Code  2- meets goals/outcomes      Flexibility, Balance, General Exercise Guidelines: - Provides group verbal and written instruction on the benefits of flexibility and balance training programs. Provides general exercise guidelines with specific guidelines to those with heart or lung disease. Demonstration and skill practice provided. Flowsheet Row Cardiac Rehab from 04/08/2016 in [facility] Cardiac and Pulmonary Rehab  Date  02/26/16  Educator  [facility]  Instruction Review Code  2- meets goals/outcomes      Stress Management: - Provides group verbal and written instruction about the health risks of elevated stress, cause of high stress, and healthy ways to reduce stress.   Depression: - Provides group verbal and written instruction on the correlation between heart/lung disease and depressed mood, treatment options, and the stigmas associated with seeking treatment. Flowsheet Row Cardiac Rehab from 04/08/2016 in [facility] Cardiac and Pulmonary Rehab  Date  04/08/16  Educator  TS  Instruction Review Code  2- meets goals/outcomes      Anatomy & Physiology of the Heart: - Group verbal and written instruction and models provide basic cardiac anatomy and physiology, with the coronary electrical and arterial systems. Review of: AMI, Angina, Valve disease, Heart Failure, Cardiac Arrhythmia, Pacemakers, and the ICD. Flowsheet Row Cardiac Rehab from 04/08/2016 in [facility] Cardiac and Pulmonary Rehab  Date  03/02/16  Educator  SB  Instruction Review Code  2- meets goals/outcomes      Cardiac Procedures: - Group verbal and written instruction and models to describe the testing methods done to diagnose heart disease. Reviews the outcomes of the test results. Describes the treatment choices: Medical Management, Angioplasty, or Coronary Bypass Surgery. Flowsheet Row Cardiac Rehab from 04/08/2016 in [facility] Cardiac and Pulmonary Rehab  Date  03/09/16  Educator  SB   Instruction Review Code  2- meets goals/outcomes      Cardiac Medications: - Group verbal and written instruction to review commonly prescribed medications for heart disease. Reviews the medication, class of the drug, and side effects. Includes the steps to properly store meds and maintain the prescription regimen. Flowsheet Row Cardiac Rehab from 04/08/2016 in [facility] Cardiac and Pulmonary Rehab  Date  03/18/16 Marisue Humble 2]  Educator  TS  Instruction Review Code  2- meets goals/outcomes      Go Sex-Intimacy & Heart Disease, Get SMART - Goal Setting: - Group verbal and written instruction through game format to discuss heart disease and the return to sexual intimacy. Provides group verbal and written material to discuss and apply goal setting through the application of the S.M.A.R.T. Method. Flowsheet Row Cardiac Rehab from 04/08/2016 in [facility] Cardiac and Pulmonary Rehab  Date  03/09/16  Educator  SB  Instruction Review Code  2- meets goals/outcomes      Other Matters of the Heart: - Provides group verbal, written materials and models to describe Heart Failure, Angina, Valve Disease, and Diabetes in the realm of heart disease. Includes description of the disease process and treatment options available to the cardiac patient.   Exercise & Equipment Safety: - Individual verbal instruction and demonstration of equipment use and safety with use of the equipment. Flowsheet Row Cardiac Rehab from 04/08/2016 in [facility] Cardiac and Pulmonary Rehab  Date  01/12/16  Educator  C. EnterkinRN  Instruction Review Code  2- meets goals/outcomes      Infection Prevention: - Provides verbal and written material to individual with discussion of infection control including proper hand washing and proper equipment cleaning during exercise session. Flowsheet Row Cardiac Rehab from 04/08/2016 in [facility] Cardiac and Pulmonary Rehab  Date  01/12/16  Educator  C. EnterkinRN  Instruction Review Code  2- meets  goals/outcomes      Falls Prevention: - Provides verbal and written material to individual with discussion of falls prevention and safety. Flowsheet Row Cardiac Rehab from 04/08/2016 in [facility] Cardiac and Pulmonary Rehab  Date  01/12/16  Educator  Cindy Hazy, RN  Instruction Review Code  2- meets goals/outcomes      Diabetes: - Individual verbal and written instruction to review signs/symptoms of diabetes, desired ranges of glucose level fasting, after meals and with exercise. Advice that pre and post exercise glucose checks will be done for 3 sessions at entry of program. Flowsheet Row Cardiac Rehab from 04/08/2016 in [facility] Cardiac and Pulmonary Rehab  Date  01/12/16  Educator  Loletha Grayer ENterkinRN  Instruction Review Code  2- meets goals/outcomes       Knowledge Questionnaire Score:     Knowledge Questionnaire Score - 03/16/16 1109      Knowledge Questionnaire Score   Pre Score 20/24   Post Score 23/28      Core Components/Risk Factors/Patient Goals at Admission:     Personal Goals and Risk Factors at Admission - 01/12/16 1353      Core Components/Risk Factors/Patient Goals on Admission    Weight Management Yes;Obesity;Weight Loss   Intervention Weight Management: Develop a combined nutrition and exercise program designed to reach desired caloric intake, while maintaining appropriate intake of nutrient and fiber, sodium and fats, and appropriate energy expenditure required for the weight goal.;Weight Management: Provide education and appropriate resources to help participant work on and attain dietary goals.;Weight Management/Obesity: Establish reasonable short term and long term weight goals.;Obesity: Provide education and appropriate resources to help participant work on and attain dietary goals.   Admit Weight 168 lb 11.2 oz (76.5 kg)   Goal Weight: Short Term 165 lb (74.8 kg)   Goal Weight: Long Term 158 lb (71.7 kg)   Expected Outcomes Short Term: Continue to assess and  modify interventions until short term weight is achieved;Long Term: Adherence to nutrition and physical activity/exercise program aimed toward attainment of established weight goal;Weight Loss:  Understanding of general recommendations for a balanced deficit meal plan, which promotes 1-2 lb weight loss per week and includes a negative energy balance of [phone] kcal/d;Understanding of distribution of calorie intake throughout the day with the consumption of 4-5 meals/snacks;Understanding recommendations for meals to include 15-35% energy as protein, 25-35% energy from fat, 35-60% energy from carbohydrates, less than '200mg'$  of dietary cholesterol, 20-35 gm of total fiber daily   Sedentary Yes   Intervention Provide advice, education, support and counseling about physical activity/exercise needs.;Develop an individualized exercise prescription for aerobic and resistive training based on initial evaluation findings, risk stratification, comorbidities and participant's personal goals.   Expected Outcomes Achievement of increased cardiorespiratory fitness and enhanced flexibility, muscular endurance and strength shown through measurements of functional capacity and personal statement of participant.   Increase Strength and Stamina Yes   Intervention Provide advice, education, support and counseling about physical activity/exercise needs.;Develop an individualized exercise prescription for aerobic and resistive training based on initial evaluation findings, risk stratification, comorbidities and participant's personal goals.   Expected Outcomes Achievement of increased cardiorespiratory fitness and enhanced flexibility, muscular endurance and strength shown through measurements of functional capacity and personal statement of participant.   Diabetes Yes   Intervention Provide education about signs/symptoms and action to take for hypo/hyperglycemia.;Provide education about proper nutrition, including hydration, and  aerobic/resistive exercise prescription along with prescribed medications to achieve blood glucose in normal ranges: Fasting glucose 65-99 mg/dL   Expected Outcomes Short Term: Participant verbalizes understanding of the signs/symptoms and immediate care of hyper/hypoglycemia, proper foot care and importance of medication, aerobic/resistive exercise and nutrition plan for blood glucose control.;Long Term: Attainment of HbA1C < 7%.   Hypertension Yes   Intervention Provide education on lifestyle modifcations including regular physical activity/exercise, weight management, moderate sodium restriction and increased consumption of fresh fruit, vegetables, and low fat dairy, alcohol moderation, and smoking cessation.;Monitor prescription use compliance.   Expected Outcomes Short Term: Continued assessment and intervention until BP is < 140/83m HG in hypertensive participants. < 130/849mHG in hypertensive participants with diabetes, heart failure or chronic kidney disease.;Long Term: Maintenance of blood pressure at goal levels.   Lipids Yes   Intervention Provide education and support for participant on nutrition & aerobic/resistive exercise along with prescribed medications to achieve LDL '70mg'$ , HDL >'40mg'$ .   Expected Outcomes Short Term: Participant states understanding of desired cholesterol values and is compliant with medications prescribed. Participant is following exercise prescription and nutrition guidelines.;Long Term: Cholesterol controlled with medications as prescribed, with individualized exercise RX and with personalized nutrition plan. Value goals: LDL < '70mg'$ , HDL > 40 mg.   Personal Goal Other Yes   Personal Goal To go back to driving herself. Tyese would like to be able to physically pick up her 2 7ear old great grandchild.    Intervention Encourage her to exercise in Cardiac Rehab 2-3 times a week.    Expected Outcomes For Aalaya to be able to drive again. For Clytie to be able to have the strength  to physically pick up her 2 65ear old grandchild.       Core Components/Risk Factors/Patient Goals Review:      Goals and Risk Factor Review    Row Name 01/27/16 1014 02/26/16 1031 03/18/16 0952         Core Components/Risk Factors/Patient Goals Review   Personal Goals Review Stress Weight Management/Obesity;Sedentary;Increase Strength and Stamina;Diabetes;Hypertension;Lipids;Stress Weight Management/Obesity;Sedentary;Increase Strength and Stamina;Diabetes;Hypertension;Lipids;Stress     Review Ceonna really wants to be told that everything is  going to get better and that she could come off of her medications.  We talked about the realistic of coming off medication, but that she could feel better and get back to enjoying life and her great grandkids. Keishawna has been doing well in rehab.  She feels that she is getting stronger and able to get out and do more.  She is enjoying being able to play with her grandkids again.  Her weight (166-170), blood pressure, and blood sugars have all been good.  She has had some incisonal soreness and redness and plans to see the surgeon today. Nashalie continues to do well in rehab.  She continues to get stronger and feel better.  Her incision is starting to heal more.  She continues to play with her grandkids.  Weight, blood pressure, and sugars have been good. She is doing well with her medications.  She is now hoping to be able to go back to work after graduation.     Expected Outcomes Amalee will continue to come to class to work on strength and stamina. Roselyne will continue to come to class for strength, stamina, and risk factor modification. Mescal will continue to work on her strength and stamina to be able to get back to work.        Core Components/Risk Factors/Patient Goals at Discharge (Final Review):      Goals and Risk Factor Review - 03/18/16 0952      Core Components/Risk Factors/Patient Goals Review   Personal Goals Review Weight  Management/Obesity;Sedentary;Increase Strength and Stamina;Diabetes;Hypertension;Lipids;Stress   Review Averyanna continues to do well in rehab.  She continues to get stronger and feel better.  Her incision is starting to heal more.  She continues to play with her grandkids.  Weight, blood pressure, and sugars have been good. She is doing well with her medications.  She is now hoping to be able to go back to work after graduation.   Expected Outcomes Tequila will continue to work on her strength and stamina to be able to get back to work.      ITP Comments:     ITP Comments    Row Name 01/12/16 1348 01/12/16 1352 02/04/16 0708 03/03/16 0604 03/30/16 0626   ITP Comments ITP Created during Medical Review/Orientation Appt. Documentation of diagnosis in EPIC/CHL encounter 11/07/2015. Timmya's daughter takes care of her now since Kaithlyn's open heart surgery. Stress due to Yazhini's daughter is working full time etc. Briza would like to go back to driving herself. Keana would like to be able to take care of and physically pick up her great granchildren.  Individual appt made for Nyra and her daughter with our Cardiac Rehab Registerd Dietician.  30 day review completed for Medical Director physician review and signature. Continue ITP unless changes made by physician. 30 day review completed for review by Dr Emily Filbert.  Continue with ITP unless changes noted by Dr [name] Heck. 30 day review. Continue with ITP unless directed changes per Medical Director review.      Comments: Discharge ITP

## 2016-04-08 NOTE — Progress Notes (Signed)
Daily Session Note  Patient Details  Name: DELENE MORAIS MRN: [id number] Date of Birth: 1950/06/14 Referring Provider:   Flowsheet Row Cardiac Rehab from 01/12/2016 in [facility] Cardiac and Pulmonary Rehab  Referring Provider  [name]      Encounter Date: 04/08/2016  Check In:     Session Check In - 04/08/16 0841      Check-In   Location [facility]-Cardiac & Pulmonary Rehab   Staff Present Alberteen Sam, MA, ACSM RCEP, Exercise Physiologist;Patricia Surles RN Vickki Hearing, BA, ACSM CEP, Exercise Physiologist   Supervising physician immediately available to respond to emergencies See telemetry face sheet for immediately available ER MD   Medication changes reported     No   Fall or balance concerns reported    No   Warm-up and Cool-down Performed on first and last piece of equipment   Resistance Training Performed Yes   VAD Patient? No     VAD patient   Has back up controller? No     Pain Assessment   Currently in Pain? No/denies   Multiple Pain Sites No         Goals Met:  Independence with exercise equipment Exercise tolerated well Personal goals reviewed No report of cardiac concerns or symptoms Strength training completed today  Goals Unmet:  Not Applicable  Comments:  Philamena graduated today from cardiac rehab with 36 sessions completed.  Details of the patient's exercise prescription and what He needs to do in order to continue the prescription and progress were discussed with patient.  Patient was given a copy of prescription and goals.  Patient verbalized understanding.  Kellsie plans to continue to exercise by walking at home and playing with her grandkids.    Dr. Emily Filbert is Medical Director for [place] and [facility].

## 2016-04-09 ENCOUNTER — Ambulatory Visit (HOSPITAL_BASED_OUTPATIENT_CLINIC_OR_DEPARTMENT_OTHER): Payer: BLUE CROSS/BLUE SHIELD | Attending: Pulmonary Disease | Admitting: Pulmonary Disease

## 2016-04-09 DIAGNOSIS — G4733 Obstructive sleep apnea (adult) (pediatric): Secondary | ICD-10-CM | POA: Diagnosis not present

## 2016-04-09 DIAGNOSIS — G4736 Sleep related hypoventilation in conditions classified elsewhere: Secondary | ICD-10-CM | POA: Diagnosis not present

## 2016-04-09 DIAGNOSIS — E119 Type 2 diabetes mellitus without complications: Secondary | ICD-10-CM | POA: Insufficient documentation

## 2016-04-09 DIAGNOSIS — I10 Essential (primary) hypertension: Secondary | ICD-10-CM | POA: Insufficient documentation

## 2016-04-12 ENCOUNTER — Emergency Department
Admission: EM | Admit: 2016-04-12 | Discharge: 2016-04-12 | Disposition: A | Discharge status: Home / Self Care | Payer: BLUE CROSS/BLUE SHIELD | Attending: Emergency Medicine | Admitting: Emergency Medicine

## 2016-04-12 ENCOUNTER — Encounter: Payer: Self-pay | Admitting: Emergency Medicine

## 2016-04-12 DIAGNOSIS — Z79899 Other long term (current) drug therapy: Secondary | ICD-10-CM | POA: Diagnosis not present

## 2016-04-12 DIAGNOSIS — Z7984 Long term (current) use of oral hypoglycemic drugs: Secondary | ICD-10-CM | POA: Diagnosis not present

## 2016-04-12 DIAGNOSIS — Z951 Presence of aortocoronary bypass graft: Secondary | ICD-10-CM | POA: Diagnosis not present

## 2016-04-12 DIAGNOSIS — M316 Other giant cell arteritis: Secondary | ICD-10-CM

## 2016-04-12 DIAGNOSIS — Z7982 Long term (current) use of aspirin: Secondary | ICD-10-CM | POA: Diagnosis not present

## 2016-04-12 DIAGNOSIS — Z87891 Personal history of nicotine dependence: Secondary | ICD-10-CM | POA: Insufficient documentation

## 2016-04-12 DIAGNOSIS — H5711 Ocular pain, right eye: Secondary | ICD-10-CM | POA: Diagnosis present

## 2016-04-12 DIAGNOSIS — I10 Essential (primary) hypertension: Secondary | ICD-10-CM | POA: Diagnosis not present

## 2016-04-12 DIAGNOSIS — I25810 Atherosclerosis of coronary artery bypass graft(s) without angina pectoris: Secondary | ICD-10-CM | POA: Insufficient documentation

## 2016-04-12 DIAGNOSIS — E119 Type 2 diabetes mellitus without complications: Secondary | ICD-10-CM | POA: Insufficient documentation

## 2016-04-12 LAB — COMPREHENSIVE METABOLIC PANEL
ALT: 9 U/L — ABNORMAL LOW (ref 14–54)
AST: 18 U/L (ref 15–41)
Albumin: 4.2 g/dL (ref 3.5–5.0)
Alkaline Phosphatase: 76 U/L (ref 38–126)
Anion gap: 9 (ref 5–15)
BUN: 10 mg/dL (ref 6–20)
CO2: 24 mmol/L (ref 22–32)
Calcium: 9.6 mg/dL (ref 8.9–10.3)
Chloride: 107 mmol/L (ref 101–111)
Creatinine, Ser: 0.54 mg/dL (ref 0.44–1.00)
GFR calc Af Amer: >60 mL/min (ref >60)
GFR calc non Af Amer: >60 mL/min (ref >60)
Glucose, Bld: 136 mg/dL — ABNORMAL HIGH (ref 65–99)
Potassium: 3.9 mmol/L (ref 3.5–5.1)
Sodium: 140 mmol/L (ref 135–145)
Total Bilirubin: 0.5 mg/dL (ref 0.3–1.2)
Total Protein: 7.9 g/dL (ref 6.5–8.1)

## 2016-04-12 LAB — CBC
HCT: 37.4 % (ref 35.0–47.0)
Hemoglobin: 12.7 g/dL (ref 12.0–16.0)
MCH: 31.2 pg (ref 26.0–34.0)
MCHC: 33.8 g/dL (ref 32.0–36.0)
MCV: 92 fL (ref 80.0–100.0)
Platelets: 235 10*3/uL (ref 150–440)
RBC: 4.07 MIL/uL (ref 3.80–5.20)
RDW: 14.2 % (ref 11.5–14.5)
WBC: 6 10*3/uL (ref 3.6–11.0)

## 2016-04-12 LAB — SEDIMENTATION RATE: Sed Rate: 59 mm/hr — ABNORMAL HIGH (ref 0–30)

## 2016-04-12 LAB — C-REACTIVE PROTEIN: CRP: 1.4 mg/dL — ABNORMAL HIGH (ref <1.0)

## 2016-04-12 MED ORDER — FLUORESCEIN SODIUM 1 MG OP STRP
ORAL_STRIP | OPHTHALMIC | Status: AC
  Administered 2016-04-12: 1 via OPHTHALMIC
  Filled 2016-04-12: qty 1

## 2016-04-12 MED ORDER — FLUORESCEIN SODIUM 0.6 MG OP STRP
1.0000 | ORAL_STRIP | Freq: Once | OPHTHALMIC | Status: AC
  Administered 2016-04-12: 1 via OPHTHALMIC

## 2016-04-12 MED ORDER — TETRACAINE HCL 0.5 % OP SOLN
2.0000 [drp] | Freq: Once | OPHTHALMIC | Status: AC
  Administered 2016-04-12: 2 [drp] via OPHTHALMIC

## 2016-04-12 MED ORDER — TETRACAINE HCL 0.5 % OP SOLN
OPHTHALMIC | Status: AC
  Administered 2016-04-12: 2 [drp] via OPHTHALMIC
  Filled 2016-04-12: qty 2

## 2016-04-12 MED ORDER — PREDNISONE 20 MG PO TABS
60.0000 mg | ORAL_TABLET | Freq: Once | ORAL | Status: AC
  Administered 2016-04-12: 60 mg via ORAL
  Filled 2016-04-12: qty 3

## 2016-04-12 MED ORDER — ACETAMINOPHEN 325 MG PO TABS
650.0000 mg | ORAL_TABLET | Freq: Once | ORAL | Status: AC
  Administered 2016-04-12: 650 mg via ORAL

## 2016-04-12 MED ORDER — ACETAMINOPHEN 325 MG PO TABS
ORAL_TABLET | ORAL | Status: AC
  Filled 2016-04-12: qty 2

## 2016-04-12 MED ORDER — PREDNISONE 10 MG PO TABS: 60.0000 mg | ORAL_TABLET | Freq: Every day | ORAL | 0 refills | Status: DC

## 2016-04-12 NOTE — Discharge Instructions (Signed)
Follow up with family doctor to monitory blood sugar levels while on prednisone. Follow up with ear, nose and throat doctor to be evaluated for temporal arteritis.

## 2016-04-12 NOTE — ED Notes (Signed)
AAOx3.  Skin warm and dry.  Ambulates with easy and steady gait. NAD 

## 2016-04-12 NOTE — ED Provider Notes (Signed)
[facility] Emergency Department Provider Note  ____________________________________________  Time seen: Approximately 1:42 PM  I have reviewed the triage vital signs and the nursing notes.   HISTORY  Chief Complaint Eye Pain    HPI Candace [name] is a 66 y.o. female that presents to the emergency department with pain over right eye and headache on right side. Patient states that she woke up this morning with significant temporal pain and swelling to the right of right eye. Patient states that the pain feels sharp and extends into eye. Patient denies any trauma. Patient denies any difficulty seeing, blurry vision, double vision, floaters or flashers. Patient has not taken anything for symptoms. Patient denies fever, eye drainage, congestion, jaw pain, shortness of breath, chest pain, abdominal pain.   Past Medical History:  Diagnosis Date  . Anxiety   . Arrhythmia   . Arthritis   . Diabetes mellitus without complication ([place])   . Hyperlipidemia   . Hypertension   . Reflux   . Stroke [facility])     Patient Active Problem List   Diagnosis Date Noted  . Snoring 02/13/2016  . Bradycardia 12/31/2015  . Asthma 11/11/2015  . S/P CABG x 4 11/10/2015  . Coronary artery disease 11/07/2015  . Benign essential HTN 10/28/2014  . Episode of syncope 10/25/2014  . Carotid artery narrowing 02/08/2014  . Chest pain 02/08/2014  . Mixed hyperlipidemia 02/08/2014  . Atypical chest pain 02/07/2014  . Arteriosclerosis of coronary artery 02/07/2014  . Essential (primary) hypertension 02/07/2014  . Awareness of heartbeats 02/07/2014  . Abdominal pain 10/05/2013  . D (diarrhea) 10/05/2013  . Acid reflux 10/05/2013    Past Surgical History:  Procedure Laterality Date  . APPENDECTOMY    . CARDIAC CATHETERIZATION N/A 11/06/2015   Procedure: Left Heart Cath and Coronary Angiography;  Surgeon: Corey Skains, MD;  Location: [place] [facility];  Service: Cardiovascular;   Laterality: N/A;  . CESAREAN SECTION    . CORONARY ARTERY BYPASS GRAFT N/A 11/10/2015   Procedure: CORONARY ARTERY BYPASS GRAFTING (CABG), ON PUMP, TIMES FOUR, USING LEFT INTERNAL MAMMARY ARTERY, BILATERAL GREATER SAPHENOUS VEINS HARVESTED ENDOSCOPICALLY;  Surgeon: [name], MD;  Location: [place];  Service: Open Heart Surgery;  Laterality: N/A;  LIMA-LAD; SEQ SVG-OM1-OM2; SVG-PL  . TEE WITHOUT CARDIOVERSION N/A 11/10/2015   Procedure: TRANSESOPHAGEAL ECHOCARDIOGRAM (TEE);  Surgeon: [name], MD;  Location: [place];  Service: Open Heart Surgery;  Laterality: N/A;  . TUBAL LIGATION      Prior to Admission medications   Medication Sig Start Date End Date Taking? Authorizing Provider  albuterol (PROVENTIL HFA;VENTOLIN HFA) 108 (90 Base) MCG/ACT inhaler Inhale 2 puffs into the lungs every 6 (six) hours as needed for wheezing or shortness of breath. 12/19/15   Rigoberto Noel, MD  aspirin EC 325 MG EC tablet Take 1 tablet (325 mg total) by mouth daily. 11/18/15   Wayne E Gold, PA-C  cetirizine (ZYRTEC) 10 MG tablet Take 10 mg by mouth daily.    Historical Provider, MD  ferrous [id number] C-folic acid (TRINSICON / FOLTRIN) capsule Take 1 capsule by mouth 3 (three) times daily after meals. 11/18/15   Wayne E Gold, PA-C  linagliptin (TRADJENTA) 5 MG TABS tablet Take 5 mg by mouth daily.    Historical Provider, MD  metFORMIN (GLUCOPHAGE-XR) 750 MG 24 hr tablet Take 1 tablet (750 mg total) by mouth 2 (two) times daily. 11/18/15   Wayne E Gold, PA-C  metoprolol tartrate (LOPRESSOR)  25 MG tablet Take 25 mg by mouth 2 (two) times daily.    Historical Provider, MD  oxyCODONE (OXY IR/ROXICODONE) 5 MG immediate release tablet Take 1 tablet (5 mg total) by mouth every 6 (six) hours as needed for severe pain. 11/18/15   Wayne E Gold, PA-C  pantoprazole (PROTONIX) 40 MG tablet Take 40 mg by mouth daily.    Historical Provider, MD  predniSONE (DELTASONE) 10 MG tablet Take 6 tablets (60 mg total) by  mouth daily. 04/12/16 04/26/16  Laban Emperor, PA-C  rosuvastatin (CRESTOR) 40 MG tablet Take 1 tablet (40 mg total) by mouth daily at 6 PM. 11/18/15   John Giovanni, PA-C  sertraline (ZOLOFT) 50 MG tablet Take 50 mg by mouth daily.    Historical Provider, MD    Allergies Penicillins  Family History  Problem Relation Age of Onset  . Cancer Father   . Hypertension Father   . Heart disease Father   . Cancer Mother   . Hypertension Mother   . Cancer Sister   . Breast cancer Sister 68  . Breast cancer Sister 7    Social History Social History  Substance Use Topics  . Smoking status: Former Smoker    Types: Cigarettes    Quit date: 10/07/2001  . Smokeless tobacco: Never Used  . Alcohol use No     Review of Systems  Constitutional: No fever/chills ENT: No upper respiratory complaints. Cardiovascular: No chest pain. Respiratory: No cough. No SOB. Gastrointestinal: No abdominal pain.  No nausea, no vomiting.  Skin: Negative for rash, abrasions, lacerations, ecchymosis. Neurological: Negative for  numbness or tingling   ____________________________________________   PHYSICAL EXAM:  VITAL SIGNS: ED Triage Vitals [04/12/16 1201]  Enc Vitals Group     BP (!) 165/93     Pulse Rate 80     Resp 18     Temp 98.6 F (37 C)     Temp Source Oral     SpO2 100 %     Weight 168 lb (76.2 kg)     Height '5\' 3"'  (1.6 m)     Head Circumference      Peak Flow      Pain Score 7     Pain Loc      Pain Edu?      Excl. in [place]?      Constitutional: Alert and oriented. Well appearing and in no acute distress. Eyes: Conjunctivae are normal. PERRL. EOMI. No abrasion seen on fluorescence stain. Tonometer pressures of right eye R 18, 20, 20. No discharge. No swelling noted. Head: Atraumatic. Tenderness to palpation over right temple.  ENT:      Ears: Tympanic membranes pearly gray with good landmarks.      Nose: No congestion/rhinnorhea.      Mouth/Throat: Mucous membranes are moist.   Neck: No stridor.  No cervical spine tenderness to palpation. Cardiovascular: Normal rate, regular rhythm. Normal S1 and S2.  Good peripheral circulation. Respiratory: Normal respiratory effort without tachypnea or retractions. Lungs CTAB. Good air entry to the bases with no decreased or absent breath sounds. Musculoskeletal: Full range of motion to all extremities. No gross deformities appreciated. Neurologic:  Normal speech and language. No gross focal neurologic deficits are appreciated.  Skin:  Skin is warm, dry and intact. No rash noted. Psychiatric: Mood and affect are normal. Speech and behavior are normal. Patient exhibits appropriate insight and judgement.   ____________________________________________   LABS (all labs ordered are listed, but only abnormal  results are displayed)  Labs Reviewed  SEDIMENTATION RATE - Abnormal; Notable for the following:       Result Value   Sed Rate 59 (*)    All other components within normal limits  COMPREHENSIVE METABOLIC PANEL - Abnormal; Notable for the following:    Glucose, Bld 136 (*)    ALT 9 (*)    All other components within normal limits  CBC  C-REACTIVE PROTEIN   ____________________________________________  EKG   ____________________________________________  RADIOLOGY  No results found.  ____________________________________________    PROCEDURES  Procedure(s) performed:    Procedures    Medications  acetaminophen (TYLENOL) 325 MG tablet (  Not Given 04/12/16 1429)  acetaminophen (TYLENOL) tablet 650 mg (650 mg Oral Given 04/12/16 1340)  tetracaine (PONTOCAINE) 0.5 % ophthalmic solution 2 drop (2 drops Right Eye Given 04/12/16 1430)  fluorescein ophthalmic strip 1 strip (1 strip Right Eye Given 04/12/16 1430)  predniSONE (DELTASONE) tablet 60 mg (60 mg Oral Given 04/12/16 1527)     ____________________________________________   INITIAL IMPRESSION / ASSESSMENT AND PLAN / ED COURSE  Pertinent labs &  imaging results that were available during my care of the patient were reviewed by me and considered in my medical decision making (see chart for details).  Review of the [place] CSRS was performed in accordance of the [place] prior to dispensing any controlled drugs.  Clinical Course     Suspicion for temporal arteritis is high so patient was started on a course of prednisone. Patient was extremely tender over right temple. ESR and CRP were elevated.  Patient is diabetic but benefits of prednisone for temporal arteritis outweighed the risks. Patient is to follow up with PCP to monitor blood sugars. Patient is to follow up with ENT to be further evaluated for temporal arteritis. Patient was given Tylenol for right-sided headache, which improved pain. Patient denies any difficulties with vision. No abnormalities seen on fluorescein stain and no elevated occular pressures.  Patient is given ED precautions to return to the ED for any worsening or new symptoms.     ____________________________________________  FINAL CLINICAL IMPRESSION(S) / ED DIAGNOSES  Final diagnoses:  Temporal arteritis syndrome ([place])      NEW MEDICATIONS STARTED DURING THIS VISIT:  New Prescriptions   PREDNISONE (DELTASONE) 10 MG TABLET    Take 6 tablets (60 mg total) by mouth daily.        This chart was dictated using voice recognition software/Dragon. Despite best efforts to proofread, errors can occur which can change the meaning. Any change was purely unintentional.    Laban Emperor, PA-C 04/12/16 [place], MD 04/14/16 [phone]

## 2016-04-12 NOTE — ED Triage Notes (Signed)
Patient presents to the ED with pain and swelling around and to her right eye.  Patient states pain began when she woke up this morning.  No redness noted.  Patient denies blurry vision.

## 2016-04-12 NOTE — ED Notes (Signed)
See triage note   States she woke up with pain to right eye this am   No redness or blurred vision or trauma

## 2016-04-14 ENCOUNTER — Ambulatory Visit: Payer: BLUE CROSS/BLUE SHIELD | Admitting: Neurology

## 2016-04-19 ENCOUNTER — Telehealth: Payer: Self-pay | Admitting: Pulmonary Disease

## 2016-04-19 ENCOUNTER — Ambulatory Visit (INDEPENDENT_AMBULATORY_CARE_PROVIDER_SITE_OTHER): Payer: BLUE CROSS/BLUE SHIELD | Admitting: Neurology

## 2016-04-19 ENCOUNTER — Other Ambulatory Visit (INDEPENDENT_AMBULATORY_CARE_PROVIDER_SITE_OTHER): Payer: BLUE CROSS/BLUE SHIELD

## 2016-04-19 ENCOUNTER — Telehealth: Payer: Self-pay | Admitting: Neurology

## 2016-04-19 ENCOUNTER — Other Ambulatory Visit: Payer: Self-pay | Admitting: Otolaryngology

## 2016-04-19 ENCOUNTER — Encounter: Payer: Self-pay | Admitting: Neurology

## 2016-04-19 VITALS — BP 126/74 | HR 73 | Ht 63.0 in | Wt 170.8 lb

## 2016-04-19 DIAGNOSIS — I10 Essential (primary) hypertension: Secondary | ICD-10-CM

## 2016-04-19 DIAGNOSIS — R519 Headache, unspecified: Secondary | ICD-10-CM

## 2016-04-19 DIAGNOSIS — M316 Other giant cell arteritis: Secondary | ICD-10-CM | POA: Diagnosis not present

## 2016-04-19 DIAGNOSIS — F99 Mental disorder, not otherwise specified: Secondary | ICD-10-CM | POA: Diagnosis not present

## 2016-04-19 DIAGNOSIS — G4733 Obstructive sleep apnea (adult) (pediatric): Secondary | ICD-10-CM

## 2016-04-19 DIAGNOSIS — R410 Disorientation, unspecified: Secondary | ICD-10-CM

## 2016-04-19 DIAGNOSIS — R51 Headache: Principal | ICD-10-CM

## 2016-04-19 LAB — VITAMIN B12: Vitamin B-12: 228 pg/mL (ref 211–911)

## 2016-04-19 LAB — TSH: TSH: 1.4 u[IU]/mL (ref 0.35–4.50)

## 2016-04-19 MED ORDER — PREDNISONE 10 MG PO TABS: 80.0000 mg | ORAL_TABLET | Freq: Every day | ORAL | 0 refills | Status: DC

## 2016-04-19 MED ORDER — ALENDRONATE SODIUM 5 MG PO TABS: 5.0000 mg | ORAL_TABLET | Freq: Every day | ORAL | 0 refills | Status: DC

## 2016-04-19 NOTE — Patient Instructions (Addendum)
1.  We will increase prednisone to 80mg  daily.  To help prevent osteoporosis that may develop with steroid, I will prescribe you alendronate 5mg  daily as well.  For acute pain, may use tramadol but may need to go to ED if pain is intractable. 2.  We will refer you to rheumatology 3.  You will need a formal eye exam. 4.  For memory, we will refer you for neurocognitive testing here and we will check B12 and TSH. 5.  Follow up after testing.

## 2016-04-19 NOTE — Procedures (Signed)
Patient Name: Candace [name], Candace [name] Date: 04/09/2016 Gender: Female D.O.B: [birth year] Age (years): 43 Referring Provider: Kara Mead MD, ABSM Height (inches): 63 Interpreting Physician: Kara Mead MD, ABSM Weight (lbs): 170 RPSGT: Jonna Coup BMI: 30 MRN: [id number] Neck Size: 16.00   CLINICAL INFORMATION Sleep Study Type: NPSG  Indication for sleep study: Diabetes, Hypertension, OSA  Epworth Sleepiness Score: 3  SLEEP STUDY TECHNIQUE As per the AASM Manual for the Scoring of Sleep and Associated Events v2.3 (April 2016) with a hypopnea requiring 4% desaturations.  The channels recorded and monitored were frontal, central and occipital EEG, electrooculogram (EOG), submentalis EMG (chin), nasal and oral airflow, thoracic and abdominal wall motion, anterior tibialis EMG, snore microphone, electrocardiogram, and pulse oximetry.  MEDICATIONS Medications self-administered by patient taken the night of the study : N/A  SLEEP ARCHITECTURE The study was initiated at 10:26:23 PM and ended at 4:27:16 AM.  Sleep onset time was 0.3 minutes and the sleep efficiency was 89.7%. The total sleep time was 323.6 minutes.  Stage REM latency was 107.0 minutes.  The patient spent 1.08% of the night in stage N1 sleep, 90.57% in stage N2 sleep, 0.00% in stage N3 and 8.34% in REM.  Alpha intrusion was absent.  Supine sleep was 67.43%.  RESPIRATORY PARAMETERS The overall apnea/hypopnea index (AHI) was 9.6 per hour. There were 5 total apneas, including 0 obstructive, 5 central and 0 mixed apneas. There were 47 hypopneas and 2 RERAs.  The AHI during Stage REM sleep was 48.9 per hour.  AHI while supine was 11.3 per hour.  The mean oxygen saturation was 92.78%. The minimum SpO2 during sleep was 79.00%.  Moderate snoring was noted during this study.  CARDIAC DATA The 2 lead EKG demonstrated sinus rhythm. The mean heart rate was 109.05 beats per minute. Other EKG findings include:  None.   LEG MOVEMENT DATA The total PLMS were 584 with a resulting PLMS index of 108.29. Associated arousal with leg movement index was 5.9 .  IMPRESSIONS - Mild obstructive sleep apnea occurred during this study (AHI = 9.6/h). Events were mostly noted during supine REM sleep - No significant central sleep apnea occurred during this study (CAI = 0.9/h). - Moderate oxygen desaturation was noted during this study (Min O2 = 79.00%)  during REM sleep - The patient snored with Moderate snoring volume. - No cardiac abnormalities were noted during this study. - Severe periodic limb movements of sleep occurred during the study. Associated arousals were significant.   DIAGNOSIS - Obstructive Sleep Apnea (327.23 [G47.33 ICD-10]) - Nocturnal Hypoxemia (327.26 [G47.36 ICD-10])    RECOMMENDATIONS - The cardiovascular significance of this degree of sleep disordered breathing is debatable. Treatment options can include positional therapy alone or CPAP/ oral appliance if very symptomatic - Avoid alcohol, sedatives and other CNS depressants that may worsen sleep apnea and disrupt normal sleep architecture. - Sleep hygiene should be reviewed to assess factors that may improve sleep quality. - Weight management and regular exercise should be initiated or continued if appropriate.    Kara Mead MD Board Certified in [place]
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
## 2016-04-19 NOTE — Telephone Encounter (Signed)
PT's daughter called and said PT is having an MRI this week and also she is scheduled for a biopsy on Monday/Dawn CB# [phone]

## 2016-04-19 NOTE — Telephone Encounter (Signed)
Left a message for patient to call back to schedule an appt with RA or TP for sleep study results.

## 2016-04-20 NOTE — Telephone Encounter (Signed)
[phone] pt calling back

## 2016-04-20 NOTE — Telephone Encounter (Signed)
Spoke with patient. She is currently scheduled for surgery on Monday 1/28 and wants to schedule her appt after the surgery, depending on how she is feeling.

## 2016-04-20 NOTE — Telephone Encounter (Signed)
Called patient. Gave lab results and instructions. Patient verbalized understanding.   

## 2016-04-20 NOTE — Telephone Encounter (Signed)
-----   Message from Pieter Partridge, DO sent at 04/19/2016  4:17 PM EST ----- B12 level is on the low normal side.  I think it would be worth treating it to see if it helps with her cognition:  B12 1051mcg daily.

## 2016-04-22 ENCOUNTER — Encounter
Admission: RE | Admit: 2016-04-22 | Discharge: 2016-04-22 | Disposition: A | Discharge status: Home / Self Care | Payer: BLUE CROSS/BLUE SHIELD | Source: Ambulatory Visit | Attending: Otolaryngology | Admitting: Otolaryngology

## 2016-04-22 DIAGNOSIS — R51 Headache: Secondary | ICD-10-CM | POA: Diagnosis not present

## 2016-04-22 DIAGNOSIS — I6782 Cerebral ischemia: Secondary | ICD-10-CM | POA: Diagnosis not present

## 2016-04-22 HISTORY — DX: Atherosclerotic heart disease of native coronary artery without angina pectoris: I25.10

## 2016-04-22 HISTORY — DX: Other voice and resonance disorders: R49.8

## 2016-04-22 HISTORY — DX: Acute myocardial infarction, unspecified: Z51.89

## 2016-04-22 HISTORY — DX: Gastro-esophageal reflux disease without esophagitis: K21.9

## 2016-04-22 HISTORY — DX: Panic disorder (episodic paroxysmal anxiety): F41.0

## 2016-04-22 HISTORY — DX: Dyspnea, unspecified: R06.00

## 2016-04-22 HISTORY — DX: Acute myocardial infarction, unspecified: I21.9

## 2016-04-22 HISTORY — DX: Headache, unspecified: R51.9

## 2016-04-22 HISTORY — DX: Headache: R51

## 2016-04-22 HISTORY — DX: Cardiac arrhythmia, unspecified: I49.9

## 2016-04-22 LAB — SURGICAL PCR SCREEN
MRSA, PCR: NEGATIVE
Staphylococcus aureus: NEGATIVE

## 2016-04-22 NOTE — Patient Instructions (Signed)
  Your procedure is scheduled on:04/26/16 Report to Day Surgery.[facility] To find out your arrival time please call [phone] between 1PM - 3PM on  04/23/16  Remember: Instructions that are not followed completely may result in serious medical risk, up to and including death, or upon the discretion of your surgeon and anesthesiologist your surgery may need to be rescheduled.    __X__ 1. Do not eat food or drink liquids after midnight. No gum chewing or hard candies.     ____ 2. No Alcohol for 24 hours before or after surgery.   ____ 3. Do Not Smoke For 24 Hours Prior to Your Surgery.   ____ 4. Bring all medications with you on the day of surgery if instructed.    __X__ 5. Notify your doctor if there is any change in your medical condition     (cold, fever, infections).       Do not wear jewelry, make-up, hairpins, clips or nail polish.  Do not wear lotions, powders, or perfumes. You may wear deodorant.  Do not shave 48 hours prior to surgery. Men may shave face and neck.  Do not bring valuables to the hospital.    [facility] is not responsible for any belongings or valuables.               Contacts, dentures or bridgework may not be worn into surgery.  Leave your suitcase in the car. After surgery it may be brought to your room.  For patients admitted to the hospital, discharge time is determined by your                treatment team.   Patients discharged the day of surgery will not be allowed to drive home.   Please read over the following fact sheets that you were given:   MRSA Information   __X__ Take these medicines the morning of surgery with A SIP OF WATER:    1. METOPROLOL  2. PREDNISONE  3. ZOLOFT  4. PROTONIX  5.  6.  ____ Fleet Enema (as directed)   ____ Use CHG Soap as directed  ____ Use inhalers on the day of surgery  __X__ Stop metformin 2 days prior to surgery    ____ Take 1/2 of usual insulin dose the night before surgery and none  on the morning of surgery.   __X__ Stop Coumadin/Plavix/aspirin on  STOP ASPIRIN UNTIL AFTER SURGERY  ____ Stop Anti-inflammatories on  ____ Stop supplements until after surgery.    ____ Bring C-Pap to the hospital.

## 2016-04-22 NOTE — Pre-Procedure Instructions (Signed)
CLEARED BY DR Nehemiah Massed 04/21/16. UNDER CARE EVERYWHERE NOTES

## 2016-04-23 ENCOUNTER — Ambulatory Visit
Admission: RE | Admit: 2016-04-23 | Discharge: 2016-04-23 | Disposition: A | Discharge status: Home / Self Care | Payer: BLUE CROSS/BLUE SHIELD | Source: Ambulatory Visit | Attending: Otolaryngology | Admitting: Otolaryngology

## 2016-04-23 DIAGNOSIS — R519 Headache, unspecified: Secondary | ICD-10-CM

## 2016-04-23 DIAGNOSIS — R51 Headache: Secondary | ICD-10-CM | POA: Diagnosis not present

## 2016-04-23 DIAGNOSIS — I6782 Cerebral ischemia: Secondary | ICD-10-CM | POA: Insufficient documentation

## 2016-04-23 MED ORDER — GADOBENATE DIMEGLUMINE 529 MG/ML IV SOLN
15.0000 mL | Freq: Once | INTRAVENOUS | Status: AC | PRN
  Administered 2016-04-23: 15 mL via INTRAVENOUS

## 2016-04-26 ENCOUNTER — Ambulatory Visit
Admission: RE | Admit: 2016-04-26 | Discharge: 2016-04-26 | Disposition: A | Discharge status: Home / Self Care | Payer: BLUE CROSS/BLUE SHIELD | Source: Ambulatory Visit | Attending: Otolaryngology | Admitting: Otolaryngology

## 2016-04-26 ENCOUNTER — Ambulatory Visit: Payer: BLUE CROSS/BLUE SHIELD | Admitting: Anesthesiology

## 2016-04-26 ENCOUNTER — Encounter
Admission: RE | Disposition: A | Discharge status: Home / Self Care | Payer: Self-pay | Source: Ambulatory Visit | Attending: Otolaryngology

## 2016-04-26 ENCOUNTER — Encounter: Payer: Self-pay | Admitting: *Deleted

## 2016-04-26 DIAGNOSIS — Z7952 Long term (current) use of systemic steroids: Secondary | ICD-10-CM | POA: Diagnosis not present

## 2016-04-26 DIAGNOSIS — I2510 Atherosclerotic heart disease of native coronary artery without angina pectoris: Secondary | ICD-10-CM | POA: Diagnosis not present

## 2016-04-26 DIAGNOSIS — Z7982 Long term (current) use of aspirin: Secondary | ICD-10-CM | POA: Diagnosis not present

## 2016-04-26 DIAGNOSIS — Z79899 Other long term (current) drug therapy: Secondary | ICD-10-CM | POA: Diagnosis not present

## 2016-04-26 DIAGNOSIS — Z8673 Personal history of transient ischemic attack (TIA), and cerebral infarction without residual deficits: Secondary | ICD-10-CM | POA: Diagnosis not present

## 2016-04-26 DIAGNOSIS — K219 Gastro-esophageal reflux disease without esophagitis: Secondary | ICD-10-CM | POA: Diagnosis not present

## 2016-04-26 DIAGNOSIS — Z7984 Long term (current) use of oral hypoglycemic drugs: Secondary | ICD-10-CM | POA: Diagnosis not present

## 2016-04-26 DIAGNOSIS — J45909 Unspecified asthma, uncomplicated: Secondary | ICD-10-CM | POA: Insufficient documentation

## 2016-04-26 DIAGNOSIS — Z87891 Personal history of nicotine dependence: Secondary | ICD-10-CM | POA: Diagnosis not present

## 2016-04-26 DIAGNOSIS — I10 Essential (primary) hypertension: Secondary | ICD-10-CM | POA: Diagnosis not present

## 2016-04-26 DIAGNOSIS — E119 Type 2 diabetes mellitus without complications: Secondary | ICD-10-CM | POA: Diagnosis not present

## 2016-04-26 DIAGNOSIS — F419 Anxiety disorder, unspecified: Secondary | ICD-10-CM | POA: Diagnosis not present

## 2016-04-26 DIAGNOSIS — I739 Peripheral vascular disease, unspecified: Secondary | ICD-10-CM | POA: Insufficient documentation

## 2016-04-26 DIAGNOSIS — D649 Anemia, unspecified: Secondary | ICD-10-CM | POA: Insufficient documentation

## 2016-04-26 DIAGNOSIS — R51 Headache: Secondary | ICD-10-CM | POA: Insufficient documentation

## 2016-04-26 DIAGNOSIS — I252 Old myocardial infarction: Secondary | ICD-10-CM | POA: Diagnosis not present

## 2016-04-26 HISTORY — PX: ARTERY BIOPSY: SHX891

## 2016-04-26 LAB — GLUCOSE, CAPILLARY
Glucose-Capillary: 254 mg/dL — ABNORMAL HIGH (ref 65–99)
Glucose-Capillary: 256 mg/dL — ABNORMAL HIGH (ref 65–99)

## 2016-04-26 SURGERY — BIOPSY TEMPORAL ARTERY
Anesthesia: General | Laterality: Right

## 2016-04-26 MED ORDER — ONDANSETRON HCL 4 MG/2ML IJ SOLN
INTRAMUSCULAR | Status: AC
  Filled 2016-04-26: qty 2

## 2016-04-26 MED ORDER — DEXAMETHASONE SODIUM PHOSPHATE 10 MG/ML IJ SOLN
INTRAMUSCULAR | Status: AC
  Filled 2016-04-26: qty 1

## 2016-04-26 MED ORDER — FENTANYL CITRATE (PF) 100 MCG/2ML IJ SOLN
INTRAMUSCULAR | Status: AC
  Filled 2016-04-26: qty 2

## 2016-04-26 MED ORDER — ONDANSETRON HCL 4 MG/2ML IJ SOLN
INTRAMUSCULAR | Status: AC
  Administered 2016-04-26: 4 mg via INTRAVENOUS
  Filled 2016-04-26: qty 2

## 2016-04-26 MED ORDER — EPINEPHRINE PF 1 MG/ML IJ SOLN
INTRAMUSCULAR | Status: AC
  Filled 2016-04-26: qty 1

## 2016-04-26 MED ORDER — LIDOCAINE-EPINEPHRINE 1 %-1:100000 IJ SOLN
INTRAMUSCULAR | Status: DC | PRN
  Administered 2016-04-26: 1 mL

## 2016-04-26 MED ORDER — LIDOCAINE HCL (PF) 1 % IJ SOLN
INTRAMUSCULAR | Status: AC
  Filled 2016-04-26: qty 30

## 2016-04-26 MED ORDER — SODIUM CHLORIDE 0.9 % IV SOLN
INTRAVENOUS | Status: DC
  Administered 2016-04-26: 09:00:00 via INTRAVENOUS

## 2016-04-26 MED ORDER — FENTANYL CITRATE (PF) 100 MCG/2ML IJ SOLN
25.0000 ug | INTRAMUSCULAR | Status: DC | PRN
  Administered 2016-04-26 (×4): 25 ug via INTRAVENOUS

## 2016-04-26 MED ORDER — MEPERIDINE HCL 25 MG/ML IJ SOLN
INTRAMUSCULAR | Status: AC
  Administered 2016-04-26: 25 mg via INTRAVENOUS
  Filled 2016-04-26: qty 1

## 2016-04-26 MED ORDER — EPHEDRINE SULFATE 50 MG/ML IJ SOLN
INTRAMUSCULAR | Status: DC | PRN
  Administered 2016-04-26 (×2): 5 mg via INTRAVENOUS
  Administered 2016-04-26: 10 mg via INTRAVENOUS

## 2016-04-26 MED ORDER — LIDOCAINE HCL (CARDIAC) 20 MG/ML IV SOLN
INTRAVENOUS | Status: DC | PRN
Start: 2016-04-26 — End: 2016-04-26
  Administered 2016-04-26: 80 mg via INTRAVENOUS

## 2016-04-26 MED ORDER — PROPOFOL 10 MG/ML IV BOLUS
INTRAVENOUS | Status: DC | PRN
  Administered 2016-04-26: 150 mg via INTRAVENOUS

## 2016-04-26 MED ORDER — FENTANYL CITRATE (PF) 100 MCG/2ML IJ SOLN
INTRAMUSCULAR | Status: AC
  Administered 2016-04-26: 25 ug via INTRAVENOUS
  Filled 2016-04-26: qty 2

## 2016-04-26 MED ORDER — INSULIN ASPART 100 UNIT/ML ~~LOC~~ SOLN
SUBCUTANEOUS | Status: AC
  Administered 2016-04-26: 3 [IU] via SUBCUTANEOUS
  Filled 2016-04-26: qty 1

## 2016-04-26 MED ORDER — PHENYLEPHRINE HCL 10 MG/ML IJ SOLN
INTRAMUSCULAR | Status: DC | PRN
  Administered 2016-04-26: 80 ug via INTRAVENOUS

## 2016-04-26 MED ORDER — HYDROMORPHONE HCL 1 MG/ML IJ SOLN
0.2500 mg | INTRAMUSCULAR | Status: DC | PRN
  Administered 2016-04-26 (×3): 0.25 mg via INTRAVENOUS

## 2016-04-26 MED ORDER — HYDROMORPHONE HCL 1 MG/ML IJ SOLN
INTRAMUSCULAR | Status: AC
  Administered 2016-04-26: 0.25 mg via INTRAVENOUS
  Filled 2016-04-26: qty 1

## 2016-04-26 MED ORDER — INSULIN ASPART 100 UNIT/ML ~~LOC~~ SOLN
SUBCUTANEOUS | Status: AC
  Filled 2016-04-26: qty 3

## 2016-04-26 MED ORDER — MIDAZOLAM HCL 2 MG/2ML IJ SOLN
INTRAMUSCULAR | Status: DC | PRN
  Administered 2016-04-26: 1 mg via INTRAVENOUS

## 2016-04-26 MED ORDER — INSULIN ASPART 100 UNIT/ML ~~LOC~~ SOLN
3.0000 [IU] | Freq: Once | SUBCUTANEOUS | Status: AC
  Administered 2016-04-26: 3 [IU] via SUBCUTANEOUS

## 2016-04-26 MED ORDER — MEPERIDINE HCL 25 MG/ML IJ SOLN
25.0000 mg | Freq: Once | INTRAMUSCULAR | Status: AC
  Administered 2016-04-26: 25 mg via INTRAVENOUS

## 2016-04-26 MED ORDER — ONDANSETRON HCL 4 MG/2ML IJ SOLN
4.0000 mg | Freq: Once | INTRAMUSCULAR | Status: AC | PRN
  Administered 2016-04-26: 4 mg via INTRAVENOUS

## 2016-04-26 MED ORDER — LIDOCAINE HCL (PF) 2 % IJ SOLN
INTRAMUSCULAR | Status: AC
  Filled 2016-04-26: qty 2

## 2016-04-26 MED ORDER — PROPOFOL 10 MG/ML IV BOLUS
INTRAVENOUS | Status: AC
  Filled 2016-04-26: qty 20

## 2016-04-26 MED ORDER — PHENYLEPHRINE 40 MCG/ML (10ML) SYRINGE FOR IV PUSH (FOR BLOOD PRESSURE SUPPORT)
PREFILLED_SYRINGE | INTRAVENOUS | Status: AC
  Filled 2016-04-26: qty 10

## 2016-04-26 MED ORDER — EPHEDRINE 5 MG/ML INJ
INTRAVENOUS | Status: AC
  Filled 2016-04-26: qty 10

## 2016-04-26 MED ORDER — ONDANSETRON HCL 4 MG/2ML IJ SOLN
INTRAMUSCULAR | Status: DC | PRN
  Administered 2016-04-26: 4 mg via INTRAVENOUS

## 2016-04-26 MED ORDER — FENTANYL CITRATE (PF) 100 MCG/2ML IJ SOLN
INTRAMUSCULAR | Status: DC | PRN
  Administered 2016-04-26: 25 ug via INTRAVENOUS

## 2016-04-26 MED ORDER — MIDAZOLAM HCL 2 MG/2ML IJ SOLN
INTRAMUSCULAR | Status: AC
  Filled 2016-04-26: qty 2

## 2016-04-26 SURGICAL SUPPLY — 32 items
BLADE SURG 15 STRL LF DISP TIS (BLADE) ×1 IMPLANT
BLADE SURG 15 STRL SS (BLADE) ×2
CANISTER SUCT 1200ML W/VALVE (MISCELLANEOUS) ×2 IMPLANT
CORD BIP STRL DISP 12FT (MISCELLANEOUS) ×2 IMPLANT
ELECT CAUTERY BLADE TIP 2.5 (TIP)
ELECT CAUTERY NEEDLE TIP 1.0 (MISCELLANEOUS) ×2
ELECT REM PT RETURN 9FT ADLT (ELECTROSURGICAL) ×2
ELECTRODE CAUTERY BLDE TIP 2.5 (TIP) ×1 IMPLANT
ELECTRODE CAUTERY NEDL TIP 1.0 (MISCELLANEOUS) ×1 IMPLANT
ELECTRODE REM PT RTRN 9FT ADLT (ELECTROSURGICAL) ×1 IMPLANT
FORCEPS JEWEL BIP 4-3/4 STR (INSTRUMENTS) ×2 IMPLANT
GLOVE SURG SYN 7.5  E (GLOVE) ×1
GLOVE SURG SYN 7.5 E (GLOVE) ×1 IMPLANT
GLOVE SURG SYN 7.5 PF PI (GLOVE) ×1 IMPLANT
GOWN STRL REUS W/ TWL LRG LVL3 (GOWN DISPOSABLE) ×2 IMPLANT
GOWN STRL REUS W/TWL LRG LVL3 (GOWN DISPOSABLE) ×4
KIT RM TURNOVER STRD PROC AR (KITS) ×2 IMPLANT
LABEL OR SOLS (LABEL) ×2 IMPLANT
NDL HYPO 25GX1X1/2 BEV (NEEDLE) ×1 IMPLANT
NEEDLE HYPO 25GX1X1/2 BEV (NEEDLE) ×2 IMPLANT
NS IRRIG 500ML POUR BTL (IV SOLUTION) ×2 IMPLANT
PACK HEAD/NECK (MISCELLANEOUS) ×2 IMPLANT
SPONGE KITTNER 5P (MISCELLANEOUS) ×2 IMPLANT
SPONGE XRAY 4X4 16PLY STRL (MISCELLANEOUS) ×2 IMPLANT
SUCTION FRAZIER HANDLE 10FR (MISCELLANEOUS) ×1
SUCTION TUBE FRAZIER 10FR DISP (MISCELLANEOUS) ×1 IMPLANT
SUT PLAIN GUT (SUTURE) ×2 IMPLANT
SUT SILK 2 0 (SUTURE) ×2
SUT SILK 2-0 18XBRD TIE 12 (SUTURE) ×1 IMPLANT
SUT VIC AB 5 0 P2 18 (SUTURE) ×1 IMPLANT
SUT VICRYL+ 4-0 18IN PS-4 (SUTURE) ×2 IMPLANT
SYR 3ML LL SCALE MARK (SYRINGE) ×2 IMPLANT

## 2016-04-26 NOTE — Transfer of Care (Signed)
Immediate Anesthesia Transfer of Care Note  Patient: Candace [name]  Procedure(s) Performed: Procedure(s): BIOPSY TEMPORAL ARTERY (Right)  Patient Location: PACU  Anesthesia Type:General  Level of Consciousness: sedated  Airway & Oxygen Therapy: Patient Spontanous Breathing and Patient connected to face mask oxygen  Post-op Assessment: Report given to RN and Post -op Vital signs reviewed and stable  Post vital signs: Reviewed and stable  Last Vitals:  Vitals:   04/26/16 0857 04/26/16 1106  BP: (!) 143/87 (!) 157/78  Pulse: 73 69  Resp: 18 (!) 21  Temp: 36.3 C 36.6 C    Last Pain:  Vitals:   04/26/16 1106  TempSrc: Temporal  PainSc: Asleep         Complications: No apparent anesthesia complications

## 2016-04-26 NOTE — Anesthesia Preprocedure Evaluation (Signed)
Anesthesia Evaluation  Patient identified by MRN, date of birth, ID band Patient awake    Reviewed: Allergy & Precautions, NPO status , Patient's Chart, lab work & pertinent test results, reviewed documented beta blocker date and time   Airway Mallampati: III  TM Distance: >3 FB Neck ROM: Full    Dental  (+) Dental Advisory Given, Partial Upper, Partial Lower   Pulmonary shortness of breath and with exertion, asthma , former smoker,    Pulmonary exam normal breath sounds clear to auscultation       Cardiovascular hypertension, Pt. on medications and Pt. on home beta blockers + angina + CAD, + Past MI and + Peripheral Vascular Disease  + dysrhythmias  Rhythm:Regular Rate:Normal  EF 55-60%. Valves ok   Neuro/Psych  Headaches, Anxiety CVA    GI/Hepatic Neg liver ROS, GERD  Medicated and Controlled,  Endo/Other  diabetes, Well Controlled, Type 2, Oral Hypoglycemic Agents  Renal/GU negative Renal ROS  negative genitourinary   Musculoskeletal  (+) Arthritis , Osteoarthritis,    Abdominal Normal abdominal exam  (+)   Peds negative pediatric ROS (+)  Hematology negative hematology ROS (+) anemia ,   Anesthesia Other Findings   Reproductive/Obstetrics                             Lab Results  Component Value Date   WBC 6.0 04/12/2016   HGB 12.7 04/12/2016   HCT 37.4 04/12/2016   MCV 92.0 04/12/2016   PLT 235 04/12/2016   Lab Results  Component Value Date   CREATININE 0.54 04/12/2016   BUN 10 04/12/2016   NA 140 04/12/2016   K 3.9 04/12/2016   CL 107 04/12/2016   CO2 24 04/12/2016    Anesthesia Physical  Anesthesia Plan  ASA: III  Anesthesia Plan: General   Post-op Pain Management:    Induction: Intravenous  Airway Management Planned: LMA  Additional Equipment:   Intra-op Plan:   Post-operative Plan:   Informed Consent: I have reviewed the patients History and Physical,  chart, labs and discussed the procedure including the risks, benefits and alternatives for the proposed anesthesia with the patient or authorized representative who has indicated his/her understanding and acceptance.   Dental advisory given  Plan Discussed with: CRNA and Surgeon  Anesthesia Plan Comments:         Anesthesia Quick Evaluation

## 2016-04-26 NOTE — Progress Notes (Signed)
States pain is better.

## 2016-04-26 NOTE — Op Note (Signed)
04/26/2016  11:01 AM    Candace [name]  [id number]    Pre-Op Dx:  Temporal arteritis  Post-op Dx: Temporal arteritis  Proc: Temporal artery biopsy   Surg:  Frederica Chrestman H  Anes:  GOT  EBL:  10 mL  Comp:  None  Findings:  Right temporal artery pulsating with some multiple branches  Procedure: The patient was given general anesthesia by LMA. The right temporal artery was marked on the forehead and then a small amount of local using 1 mL of 1% Xylocaine with epi 1:100,000 for infiltration around the artery. He was prepped and draped sterile fashion. A small horizontal incision was created over the artery itself the skin and subcutaneous were elevated some. The vessel was found sitting in the subcutaneous area. It was freed up bluntly. The vessel was pulsating. Approximately 1 inch of artery was freed up and then using the Harmonic scalpel it was cut across to remove the section of artery. There is no bleeding in the wound. There were no other pulsations that I could feel. The wound was closed with 5-0 Vicryl for deep tissues and then Dermabond for the skin. The patient tolerated the procedure well. She was awakened taken to the recovery room in satisfactory condition. there were no operative complications.  Dispo:   To PACU to be discharged home  Plan:  To follow-up in the office in 4 days for reevaluation of the wound and to go over path report  [name] H  04/26/2016 11:01 AM

## 2016-04-26 NOTE — Anesthesia Post-op Follow-up Note (Cosign Needed)
Anesthesia QCDR form completed.        

## 2016-04-26 NOTE — Anesthesia Procedure Notes (Signed)
Procedure Name: LMA Insertion Date/Time: 04/26/2016 11:34 AM Performed by: Hedda Slade Pre-anesthesia Checklist: Patient identified, Emergency Drugs available, Suction available and Patient being monitored Patient Re-evaluated:Patient Re-evaluated prior to inductionOxygen Delivery Method: Circle system utilized Preoxygenation: Pre-oxygenation with 100% oxygen Intubation Type: IV induction Ventilation: Mask ventilation without difficulty LMA: LMA inserted LMA Size: 3.5 Number of attempts: 1 Placement Confirmation: positive ETCO2 and breath sounds checked- equal and bilateral Tube secured with: Tape Dental Injury: Teeth and Oropharynx as per pre-operative assessment

## 2016-04-26 NOTE — H&P (Signed)
  H&P has been reviewed and no changes necessary. To be downloaded later. 

## 2016-04-26 NOTE — Discharge Instructions (Signed)
AMBULATORY SURGERY  °DISCHARGE INSTRUCTIONS ° ° °1) The drugs that you were given will stay in your system until tomorrow so for the next 24 hours you should not: ° °A) Drive an automobile °B) Make any legal decisions °C) Drink any alcoholic beverage ° ° °2) You may resume regular meals tomorrow.  Today it is better to start with liquids and gradually work up to solid foods. ° °You may eat anything you prefer, but it is better to start with liquids, then soup and crackers, and gradually work up to solid foods. ° ° °3) Please notify your doctor immediately if you have any unusual bleeding, trouble breathing, redness and pain at the surgery site, drainage, fever, or pain not relieved by medication. ° ° ° °4) Additional Instructions: ° ° ° ° ° ° ° °Please contact your physician with any problems or Same Day Surgery at [phone], Monday through Friday 6 am to 4 pm, or [place] [facility] [place] Main number at [phone].AMBULATORY SURGERY  °DISCHARGE INSTRUCTIONS ° ° °5) The drugs that you were given will stay in your system until tomorrow so for the next 24 hours you should not: ° °D) Drive an automobile °E) Make any legal decisions °F) Drink any alcoholic beverage ° ° °6) You may resume regular meals tomorrow.  Today it is better to start with liquids and gradually work up to solid foods. ° °You may eat anything you prefer, but it is better to start with liquids, then soup and crackers, and gradually work up to solid foods. ° ° °7) Please notify your doctor immediately if you have any unusual bleeding, trouble breathing, redness and pain at the surgery site, drainage, fever, or pain not relieved by medication. ° ° ° °8) Additional Instructions: ° ° ° ° ° ° ° °Please contact your physician with any problems or Same Day Surgery at [phone], Monday through Friday 6 am to 4 pm, or [place] [facility] [place] Main number at [phone]. °

## 2016-04-27 LAB — SURGICAL PATHOLOGY

## 2016-04-27 NOTE — Anesthesia Postprocedure Evaluation (Signed)
Anesthesia Post Note  Patient: Candace [name]  Procedure(s) Performed: Procedure(s) (LRB): BIOPSY TEMPORAL ARTERY (Right)  Patient location during evaluation: PACU Anesthesia Type: General Level of consciousness: awake and alert and oriented Pain management: pain level controlled Vital Signs Assessment: post-procedure vital signs reviewed and stable Respiratory status: spontaneous breathing Cardiovascular status: blood pressure returned to baseline Anesthetic complications: no     Last Vitals:  Vitals:   04/26/16 1221 04/26/16 1300  BP: 138/66 (!) 152/81  Pulse: 72 79  Resp: 16   Temp: 36.4 C     Last Pain:  Vitals:   04/27/16 0821  TempSrc:   PainSc: 7                  [name]

## 2016-05-05 DIAGNOSIS — R519 Headache, unspecified: Secondary | ICD-10-CM | POA: Insufficient documentation

## 2016-05-11 ENCOUNTER — Ambulatory Visit (INDEPENDENT_AMBULATORY_CARE_PROVIDER_SITE_OTHER): Payer: BLUE CROSS/BLUE SHIELD | Admitting: Neurology

## 2016-05-11 ENCOUNTER — Encounter: Payer: Self-pay | Admitting: Neurology

## 2016-05-11 VITALS — BP 124/68 | HR 90 | Ht 63.0 in | Wt 166.8 lb

## 2016-05-11 DIAGNOSIS — H538 Other visual disturbances: Secondary | ICD-10-CM | POA: Diagnosis not present

## 2016-05-11 DIAGNOSIS — R51 Headache: Secondary | ICD-10-CM | POA: Diagnosis not present

## 2016-05-11 DIAGNOSIS — R519 Headache, unspecified: Secondary | ICD-10-CM

## 2016-05-11 MED ORDER — DIVALPROEX SODIUM ER 500 MG PO TB24: 500.0000 mg | ORAL_TABLET | Freq: Every day | ORAL | 2 refills | Status: DC

## 2016-05-11 NOTE — Patient Instructions (Signed)
1.  Start Depakote ER 500mg  daily (you may take it at bedtime).  Will repeat CBC and hepatic panel in 4 weeks. 2.  Limit tramadol to no more than 2 days out of the week to prevent rebound headache 3.  Follow up with rheumatology for schedule of tapering off of prednisone 4.  Will check CTA of head. 5.  We will refer you to ophthalmology 6.  Follow up in 4 to 6 weeks.
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
## 2016-05-16 ENCOUNTER — Encounter (HOSPITAL_COMMUNITY): Payer: Self-pay

## 2016-05-16 ENCOUNTER — Emergency Department (HOSPITAL_COMMUNITY): Payer: BLUE CROSS/BLUE SHIELD

## 2016-05-16 ENCOUNTER — Emergency Department (HOSPITAL_COMMUNITY)
Admission: EM | Admit: 2016-05-16 | Discharge: 2016-05-16 | Disposition: A | Discharge status: Home / Self Care | Payer: BLUE CROSS/BLUE SHIELD | Attending: Emergency Medicine | Admitting: Emergency Medicine

## 2016-05-16 DIAGNOSIS — Z7984 Long term (current) use of oral hypoglycemic drugs: Secondary | ICD-10-CM | POA: Diagnosis not present

## 2016-05-16 DIAGNOSIS — I10 Essential (primary) hypertension: Secondary | ICD-10-CM | POA: Insufficient documentation

## 2016-05-16 DIAGNOSIS — Z8673 Personal history of transient ischemic attack (TIA), and cerebral infarction without residual deficits: Secondary | ICD-10-CM | POA: Diagnosis not present

## 2016-05-16 DIAGNOSIS — J45909 Unspecified asthma, uncomplicated: Secondary | ICD-10-CM | POA: Insufficient documentation

## 2016-05-16 DIAGNOSIS — I252 Old myocardial infarction: Secondary | ICD-10-CM | POA: Diagnosis not present

## 2016-05-16 DIAGNOSIS — G8929 Other chronic pain: Secondary | ICD-10-CM

## 2016-05-16 DIAGNOSIS — E119 Type 2 diabetes mellitus without complications: Secondary | ICD-10-CM | POA: Insufficient documentation

## 2016-05-16 DIAGNOSIS — Z87891 Personal history of nicotine dependence: Secondary | ICD-10-CM | POA: Diagnosis not present

## 2016-05-16 DIAGNOSIS — Z951 Presence of aortocoronary bypass graft: Secondary | ICD-10-CM | POA: Insufficient documentation

## 2016-05-16 DIAGNOSIS — I2510 Atherosclerotic heart disease of native coronary artery without angina pectoris: Secondary | ICD-10-CM | POA: Diagnosis not present

## 2016-05-16 DIAGNOSIS — Z7982 Long term (current) use of aspirin: Secondary | ICD-10-CM | POA: Insufficient documentation

## 2016-05-16 DIAGNOSIS — R519 Headache, unspecified: Secondary | ICD-10-CM

## 2016-05-16 DIAGNOSIS — R51 Headache: Secondary | ICD-10-CM | POA: Diagnosis not present

## 2016-05-16 LAB — CBC WITH DIFFERENTIAL/PLATELET
Basophils Absolute: 0 10*3/uL (ref 0.0–0.1)
Basophils Relative: 0 %
Eosinophils Absolute: 0 10*3/uL (ref 0.0–0.7)
Eosinophils Relative: 0 %
HCT: 42.5 % (ref 36.0–46.0)
Hemoglobin: 14.2 g/dL (ref 12.0–15.0)
Lymphocytes Relative: 50 %
Lymphs Abs: 6.6 10*3/uL — ABNORMAL HIGH (ref 0.7–4.0)
MCH: 30.9 pg (ref 26.0–34.0)
MCHC: 33.4 g/dL (ref 30.0–36.0)
MCV: 92.6 fL (ref 78.0–100.0)
Monocytes Absolute: 0.7 10*3/uL (ref 0.1–1.0)
Monocytes Relative: 5 %
Neutro Abs: 6 10*3/uL (ref 1.7–7.7)
Neutrophils Relative %: 45 %
Platelets: 244 10*3/uL (ref 150–400)
RBC: 4.59 MIL/uL (ref 3.87–5.11)
RDW: 13.9 % (ref 11.5–15.5)
WBC: 13.3 10*3/uL — ABNORMAL HIGH (ref 4.0–10.5)

## 2016-05-16 LAB — COMPREHENSIVE METABOLIC PANEL
ALT: 24 U/L (ref 14–54)
AST: 22 U/L (ref 15–41)
Albumin: 3.8 g/dL (ref 3.5–5.0)
Alkaline Phosphatase: 73 U/L (ref 38–126)
Anion gap: 15 (ref 5–15)
BUN: 17 mg/dL (ref 6–20)
CO2: 21 mmol/L — ABNORMAL LOW (ref 22–32)
Calcium: 10.6 mg/dL — ABNORMAL HIGH (ref 8.9–10.3)
Chloride: 102 mmol/L (ref 101–111)
Creatinine, Ser: 1.37 mg/dL — ABNORMAL HIGH (ref 0.44–1.00)
GFR calc Af Amer: 46 mL/min — ABNORMAL LOW (ref >60)
GFR calc non Af Amer: 40 mL/min — ABNORMAL LOW (ref >60)
Glucose, Bld: 181 mg/dL — ABNORMAL HIGH (ref 65–99)
Potassium: 3.7 mmol/L (ref 3.5–5.1)
Sodium: 138 mmol/L (ref 135–145)
Total Bilirubin: 1 mg/dL (ref 0.3–1.2)
Total Protein: 6.1 g/dL — ABNORMAL LOW (ref 6.5–8.1)

## 2016-05-16 LAB — ETHANOL: Alcohol, Ethyl (B): <5 mg/dL (ref <5)

## 2016-05-16 MED ORDER — IOPAMIDOL (ISOVUE-370) INJECTION 76%
INTRAVENOUS | Status: AC
  Administered 2016-05-16: 50 mL
  Filled 2016-05-16: qty 50

## 2016-05-16 MED ORDER — SODIUM CHLORIDE 0.9 % IV SOLN
INTRAVENOUS | Status: DC
  Administered 2016-05-16: 19:00:00 via INTRAVENOUS

## 2016-05-16 MED ORDER — DIPHENHYDRAMINE HCL 50 MG/ML IJ SOLN
12.5000 mg | Freq: Once | INTRAMUSCULAR | Status: AC
  Administered 2016-05-16: 12.5 mg via INTRAVENOUS
  Filled 2016-05-16: qty 1

## 2016-05-16 MED ORDER — HALOPERIDOL LACTATE 5 MG/ML IJ SOLN: 2.0000 mg | Freq: Once | INTRAMUSCULAR | Status: DC

## 2016-05-16 MED ORDER — PROCHLORPERAZINE EDISYLATE 5 MG/ML IJ SOLN
10.0000 mg | Freq: Once | INTRAMUSCULAR | Status: AC
  Administered 2016-05-16: 10 mg via INTRAVENOUS
  Filled 2016-05-16: qty 2

## 2016-05-16 NOTE — ED Provider Notes (Signed)
[place] DEPT Provider Note   CSN: [id number] Arrival date & time: 05/16/16  1816     History   Chief Complaint Chief Complaint  Patient presents with  . Headache    HPI Candace [name] is a 66 y.o. female.  HPI The patient presents to the emergency room with complaints of a persistent headache.   The patient has been having trouble with a persistent headache since January. Takes initially the pain was on the right side of her head but that involved the entire head. Initially she was treated presumptively for temporal arteritis. She is started on steroids. Symptoms continue to get worse despite prednisone. She had a temporal artery biopsy that ultimately was felt to be negative.   Patient also has seen a neurologist. She last was in the office 5 days ago. Patient's also had an MRI recently that was negative for any acute process.   Neurology felt that anxiety and medication overuse could be playing a role in her headaches. They recommended Depakote daily and limiting tramadol.  The patient arrived by EMS.  She was intermittently yelling out, then smiling and also danced as she moved from the stretcher the the bed.  She denies SI or HI.  She denies any depression.  She denies hallucinations.  Past Medical History:  Diagnosis Date  . Anxiety   . Arrhythmia   . Arthritis   . Coronary artery disease   . Diabetes mellitus without complication ([place])   . Dyspnea    doe  . Dysrhythmia   . GERD (gastroesophageal reflux disease)   . Headache   . Hyperlipidemia   . Hypertension   . Myocardial infarction    2016  . Myocardial infarction with cardiac rehabilitation    MI 2016/ CABG 8/17    FINISHED CARDIAC REHAB 3 WEEKS AGO  . Panic attack   . Reflux   . Stroke ([place])   . Voice tremor     Patient Active Problem List   Diagnosis Date Noted  . Snoring 02/13/2016  . Bradycardia 12/31/2015  . Asthma 11/11/2015  . S/P CABG x 4 11/10/2015  . Coronary artery disease 11/07/2015    . Benign essential HTN 10/28/2014  . Episode of syncope 10/25/2014  . Carotid artery narrowing 02/08/2014  . Chest pain 02/08/2014  . Mixed hyperlipidemia 02/08/2014  . Atypical chest pain 02/07/2014  . Arteriosclerosis of coronary artery 02/07/2014  . Essential (primary) hypertension 02/07/2014  . Awareness of heartbeats 02/07/2014  . Abdominal pain 10/05/2013  . D (diarrhea) 10/05/2013  . Acid reflux 10/05/2013    Past Surgical History:  Procedure Laterality Date  . APPENDECTOMY    . ARTERY BIOPSY Right 04/26/2016   Procedure: BIOPSY TEMPORAL ARTERY;  Surgeon: Margaretha Sheffield, MD;  Location: [facility] ORS;  Service: ENT;  Laterality: Right;  . CARDIAC CATHETERIZATION N/A 11/06/2015   Procedure: Left Heart Cath and Coronary Angiography;  Surgeon: Corey Skains, MD;  Location: [place] [facility];  Service: Cardiovascular;  Laterality: N/A;  . CESAREAN SECTION    . CORONARY ARTERY BYPASS GRAFT N/A 11/10/2015   Procedure: CORONARY ARTERY BYPASS GRAFTING (CABG), ON PUMP, TIMES FOUR, USING LEFT INTERNAL MAMMARY ARTERY, BILATERAL GREATER SAPHENOUS VEINS HARVESTED ENDOSCOPICALLY;  Surgeon: [name], MD;  Location: [facility];  Service: Open Heart Surgery;  Laterality: N/A;  LIMA-LAD; SEQ SVG-OM1-OM2; SVG-PL  . TEE WITHOUT CARDIOVERSION N/A 11/10/2015   Procedure: TRANSESOPHAGEAL ECHOCARDIOGRAM (TEE);  Surgeon: [name], MD;  Location: [facility];  Service:  Open Heart Surgery;  Laterality: N/A;  . TUBAL LIGATION      OB History    No data available       Home Medications    Prior to Admission medications   Medication Sig Start Date End Date Taking? Authorizing Provider  acetaminophen (TYLENOL) 500 MG tablet Take 1,000 mg by mouth every 6 (six) hours as needed for mild pain (alternates between).   Yes Historical Provider, MD  alendronate (FOSAMAX) 70 MG tablet Take 1 tablet by mouth once a week. 05/05/16  Yes Historical Provider, MD  aspirin EC 325 MG EC tablet Take 1 tablet (325 mg  total) by mouth daily. 11/18/15  Yes Wayne E Gold, PA-C  calcium carbonate (OS-CAL - DOSED IN MG OF ELEMENTAL CALCIUM) 1250 (500 Ca) MG tablet Take 1 tablet by mouth 2 (two) times daily. 05/06/16 05/06/17 Yes Historical Provider, MD  cetirizine (ZYRTEC) 10 MG tablet Take 10 mg by mouth daily.   Yes Historical Provider, MD  divalproex (DEPAKOTE ER) 500 MG 24 hr tablet Take 1 tablet (500 mg total) by mouth daily. 05/11/16  Yes Adam Telford Nab, DO  linagliptin (TRADJENTA) 5 MG TABS tablet Take 5 mg by mouth daily.   Yes Historical Provider, MD  metFORMIN (GLUCOPHAGE-XR) 750 MG 24 hr tablet Take 1 tablet (750 mg total) by mouth 2 (two) times daily. 11/18/15  Yes Wayne E Gold, PA-C  metoprolol tartrate (LOPRESSOR) 25 MG tablet Take 25 mg by mouth 2 (two) times daily.   Yes Historical Provider, MD  ondansetron (ZOFRAN-ODT) 4 MG disintegrating tablet Take 1 tablet by mouth every 6 (six) hours as needed for nausea or vomiting.  04/09/16  Yes Historical Provider, MD  pantoprazole (PROTONIX) 40 MG tablet Take 40 mg by mouth daily.   Yes Historical Provider, MD  predniSONE (DELTASONE) 10 MG tablet Take 8 tablets (80 mg total) by mouth daily with breakfast. Patient taking differently: Take 60 mg by mouth daily with breakfast.  04/19/16  Yes Pieter Partridge, DO  rosuvastatin (CRESTOR) 40 MG tablet Take 1 tablet (40 mg total) by mouth daily at 6 PM. 11/18/15  Yes Wayne E Gold, PA-C  sertraline (ZOLOFT) 50 MG tablet Take 50 mg by mouth daily.   Yes Historical Provider, MD  traMADol (ULTRAM) 50 MG tablet Take 50 mg by mouth every 6 (six) hours as needed for moderate pain (alternates between tramadol and Tylenol).  02/23/16  Yes Historical Provider, MD  vitamin B-12 (CYANOCOBALAMIN) 1000 MCG tablet Take 1,000 mcg by mouth daily.   Yes Historical Provider, MD  albuterol (PROVENTIL HFA;VENTOLIN HFA) 108 (90 Base) MCG/ACT inhaler Inhale 2 puffs into the lungs every 6 (six) hours as needed for wheezing or shortness of breath. Patient  not taking: Reported on 05/16/2016 12/19/15   Rigoberto Noel, MD  ferrous [id number] C-folic acid (TRINSICON / FOLTRIN) capsule Take 1 capsule by mouth 3 (three) times daily after meals. Patient not taking: Reported on 05/16/2016 11/18/15   John Giovanni, PA-C  oxyCODONE (OXY IR/ROXICODONE) 5 MG immediate release tablet Take 1 tablet (5 mg total) by mouth every 6 (six) hours as needed for severe pain. Patient not taking: Reported on 05/16/2016 11/18/15   John Giovanni, PA-C    Family History Family History  Problem Relation Age of Onset  . Cancer Father   . Hypertension Father   . Heart disease Father   . Cancer Mother   . Hypertension Mother   . Cancer Sister   .  Breast cancer Sister 16  . Breast cancer Sister 61    Social History Social History  Substance Use Topics  . Smoking status: Former Smoker    Types: Cigarettes    Quit date: 10/07/2001  . Smokeless tobacco: Never Used  . Alcohol use No     Allergies   Penicillins   Review of Systems Review of Systems  All other systems reviewed and are negative.    Physical Exam Updated Vital Signs BP 104/59   Pulse 92   Temp 98.1 F (36.7 C) (Oral)   Resp 15   SpO2 100%   Physical Exam  Constitutional: She appears well-developed and well-nourished. No distress.  HENT:  Head: Normocephalic and atraumatic.  Right Ear: External ear normal.  Left Ear: External ear normal.  Eyes: Conjunctivae are normal. Right eye exhibits no discharge. Left eye exhibits no discharge. No scleral icterus.  Neck: Neck supple. No tracheal deviation present.  Cardiovascular: Normal rate, regular rhythm and intact distal pulses.   Pulmonary/Chest: Effort normal and breath sounds normal. No stridor. No respiratory distress. She has no wheezes. She has no rales.  Abdominal: Soft. Bowel sounds are normal. She exhibits no distension. There is no tenderness. There is no rebound and no guarding.  Musculoskeletal: She exhibits no edema or  tenderness.  Neurological: She is alert. She has normal strength. No cranial nerve deficit (no facial droop, extraocular movements intact, no slurred speech) or sensory deficit. She exhibits normal muscle tone. She displays no seizure activity. Coordination normal.  Skin: Skin is warm and dry. No rash noted.  Psychiatric: Her affect is labile. She is hyperactive. She expresses no homicidal and no suicidal ideation. She is communicative.  Pt at one point yelled out and suddenly turned her head to the side and stopped speaking to me, after I asked her a coupe of times what she was doing she said she was praying  Nursing note and vitals reviewed.    ED Treatments / Results  Labs (all labs ordered are listed, but only abnormal results are displayed) Labs Reviewed  CBC WITH DIFFERENTIAL/PLATELET - Abnormal; Notable for the following:       Result Value   WBC 13.3 (*)    Lymphs Abs 6.6 (*)    All other components within normal limits  COMPREHENSIVE METABOLIC PANEL - Abnormal; Notable for the following:    CO2 21 (*)    Glucose, Bld 181 (*)    Creatinine, Ser 1.37 (*)    Calcium 10.6 (*)    Total Protein 6.1 (*)    GFR calc non Af Amer 40 (*)    GFR calc Af Amer 46 (*)    All other components within normal limits  ETHANOL     Procedures Procedures (including critical care time)  Medications Ordered in ED Medications  0.9 %  sodium chloride infusion ( Intravenous New Bag/Given 05/16/16 1902)  haloperidol lactate (HALDOL) injection 2 mg (2 mg Intravenous Not Given 05/16/16 2222)  prochlorperazine (COMPAZINE) injection 10 mg (10 mg Intravenous Given 05/16/16 1901)  diphenhydrAMINE (BENADRYL) injection 12.5 mg (12.5 mg Intravenous Given 05/16/16 1902)  iopamidol (ISOVUE-370) 76 % injection (50 mLs  Contrast Given 05/16/16 2114)     Initial Impression / Assessment and Plan / ED Course  I have reviewed the triage vital signs and the nursing notes.  Pertinent labs & imaging results that  were available during my care of the patient were reviewed by me and considered in my medical decision making (  see chart for details).  Clinical Course as of May 17 2335  Nancy Fetter May 16, 2016  1956 Updated family on status.  Pt does appear more comfortable although she is still having a headache.  Will try  a dose of haldol.   Family requested CT A of the head since her neurologist wanted to get that done.  I will go ahead and order that while she is in the ED today.  [id number] Pt is somnolent after her meds.  She will wake up when spoken to.  Temporary decrease in her o2 sat while pt was snoring.  Sat pt up in bed and started her on [place] oxygen.  [JK]  2227 Unable to get adequate IV access to perform the CT angio  [JK]  2333 Pt is feeling better.  Feels ready to go home.  [JK]    Clinical Course User Index [JK] Dorie Rank, MD   Pt has had a chronic headache for several weeks.   Extensive outpatient workup with neurology.   WBC elevated but pt is no steroids.  Cr is elevated slightly as is Calcium .  She was hydrated in the ED.  Stable for discharge.  Follow up with PCP  Final Clinical Impressions(s) / ED Diagnoses   Final diagnoses:  Chronic intractable headache, unspecified headache type    New Prescriptions New Prescriptions   No medications on file     Dorie Rank, MD 05/16/16 2338

## 2016-05-16 NOTE — Discharge Instructions (Signed)
Follow-up with your neurologist, continue your current medications °

## 2016-05-16 NOTE — ED Triage Notes (Signed)
To hallway via EMS.  Pt talking very loudly praying to God for her headache to stop.  States she has  Had headache that is getting worse since 04-22-16.  Pt is easily comforted.

## 2016-05-19 ENCOUNTER — Other Ambulatory Visit: Payer: Self-pay | Admitting: Neurology

## 2016-05-19 NOTE — Telephone Encounter (Signed)
Rx sent 

## 2016-05-28 ENCOUNTER — Telehealth: Payer: Self-pay | Admitting: Neurology

## 2016-05-28 NOTE — Telephone Encounter (Signed)
PT called and wanted a call back by Aleatha Borer nurse/Dawn CB# [phone]

## 2016-05-28 NOTE — Telephone Encounter (Signed)
Patient is coming in on Monday to see Dr. Si Raider and would like for me to look at some paperwork that she has.

## 2016-05-31 ENCOUNTER — Ambulatory Visit (INDEPENDENT_AMBULATORY_CARE_PROVIDER_SITE_OTHER): Payer: Medicare HMO | Admitting: Psychology

## 2016-05-31 DIAGNOSIS — R4189 Other symptoms and signs involving cognitive functions and awareness: Secondary | ICD-10-CM

## 2016-05-31 DIAGNOSIS — R4689 Other symptoms and signs involving appearance and behavior: Secondary | ICD-10-CM | POA: Diagnosis not present

## 2016-05-31 DIAGNOSIS — R404 Transient alteration of awareness: Secondary | ICD-10-CM
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
## 2016-06-01 ENCOUNTER — Encounter: Payer: Self-pay | Admitting: Psychology

## 2016-06-01 ENCOUNTER — Telehealth: Payer: Self-pay

## 2016-06-01 ENCOUNTER — Telehealth: Payer: Self-pay | Admitting: Neurology

## 2016-06-01 MED ORDER — TRAMADOL HCL 50 MG PO TABS
50.0000 mg | ORAL_TABLET | Freq: Four times a day (QID) | ORAL | 0 refills | Status: DC | PRN
Start: 2016-06-01 — End: 2016-06-22

## 2016-06-01 MED ORDER — ONDANSETRON 4 MG PO TBDP: 4.0000 mg | ORAL_TABLET | Freq: Four times a day (QID) | ORAL | 2 refills | Status: DC | PRN

## 2016-06-01 NOTE — Telephone Encounter (Signed)
Message relayed to patient. Verbalized understanding. Pt requesting zofran. States that she is taking there every 4 hours due to nausea. Please advise

## 2016-06-01 NOTE — Telephone Encounter (Signed)
Pt aware, RX sent in.

## 2016-06-01 NOTE — Telephone Encounter (Signed)
Candace [name] Sep 29, 2050. She lmom saying that she was in a lot of pain and needs pain medication called in for her to [facility]. Her # is [phone]. Thank you

## 2016-06-01 NOTE — Telephone Encounter (Signed)
Ok to order 

## 2016-06-01 NOTE — Telephone Encounter (Signed)
-----   Message from Pieter Partridge, DO sent at 06/01/2016 11:46 AM EST ----- I received a message that Candace [name] may be having side effects to Depakote ER.  She is taking 500mg  at bedtime.  I want her to discontinue it and to see if her behavior improves over the next few days.

## 2016-06-01 NOTE — Telephone Encounter (Signed)
RX sent in. Left message for pt to return call. Per provider she will need to go to E.R. If Tramadol is not helpful.

## 2016-06-01 NOTE — Telephone Encounter (Signed)
She should be prescribed tramadol

## 2016-06-02 ENCOUNTER — Telehealth: Payer: Self-pay | Admitting: Neurology

## 2016-06-02 NOTE — Telephone Encounter (Signed)
PT returned your call/Dawn CB# [phone]

## 2016-06-02 NOTE — Telephone Encounter (Signed)
Left message on machine for pt to return call to the office.  

## 2016-06-02 NOTE — Telephone Encounter (Signed)
Patient returned your call please call her at [phone]

## 2016-06-17 LAB — HM DIABETES EYE EXAM

## 2016-06-22 ENCOUNTER — Encounter: Payer: Self-pay | Admitting: Neurology

## 2016-06-22 ENCOUNTER — Ambulatory Visit (INDEPENDENT_AMBULATORY_CARE_PROVIDER_SITE_OTHER): Payer: Medicare HMO | Admitting: Neurology

## 2016-06-22 VITALS — BP 140/76 | HR 150 | Ht 63.0 in | Wt 154.0 lb

## 2016-06-22 DIAGNOSIS — R519 Headache, unspecified: Secondary | ICD-10-CM

## 2016-06-22 DIAGNOSIS — R000 Tachycardia, unspecified: Secondary | ICD-10-CM | POA: Diagnosis not present

## 2016-06-22 DIAGNOSIS — R51 Headache: Secondary | ICD-10-CM | POA: Diagnosis not present

## 2016-06-22 DIAGNOSIS — M791 Myalgia, unspecified site: Secondary | ICD-10-CM

## 2016-06-22 DIAGNOSIS — R410 Disorientation, unspecified: Secondary | ICD-10-CM

## 2016-06-22 DIAGNOSIS — F99 Mental disorder, not otherwise specified: Secondary | ICD-10-CM

## 2016-06-22 MED ORDER — ONDANSETRON 8 MG PO TBDP: 8.0000 mg | ORAL_TABLET | Freq: Three times a day (TID) | ORAL | 0 refills | Status: DC | PRN

## 2016-06-22 MED ORDER — NORTRIPTYLINE HCL 10 MG PO CAPS: 10.0000 mg | ORAL_CAPSULE | Freq: Every day | ORAL | 3 refills | Status: DC
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
## 2016-06-22 NOTE — Progress Notes (Signed)
Note sent to [facility].

## 2016-06-22 NOTE — Patient Instructions (Addendum)
1.  Start nortriptyline 10mg  at bedtime.  Contact me in 4 weeks with update and if headaches not better, we can increase dose 2.  Take benadryl and Zofran 8mg  when headaches are severe 3.  We will refer you to an academic center for second opinion. 4.  Follow up in 2 months.

## 2016-06-23 ENCOUNTER — Telehealth: Payer: Self-pay | Admitting: Neurology

## 2016-06-23 NOTE — Telephone Encounter (Signed)
Referral faxed with all records to [facility] for second opinion on headaches and episodic confusion to [phone] with confirmation received. They will contact patient with appt.

## 2016-07-13 ENCOUNTER — Other Ambulatory Visit: Payer: Self-pay | Admitting: Family Medicine

## 2016-07-13 DIAGNOSIS — Z1231 Encounter for screening mammogram for malignant neoplasm of breast: Secondary | ICD-10-CM

## 2016-07-26 ENCOUNTER — Encounter: Payer: Self-pay | Admitting: Emergency Medicine

## 2016-07-26 ENCOUNTER — Emergency Department: Payer: Medicare Other

## 2016-07-26 ENCOUNTER — Observation Stay
Admission: EM | Admit: 2016-07-26 | Discharge: 2016-07-27 | Disposition: A | Discharge status: Home / Self Care | Payer: Medicare Other | Attending: Internal Medicine | Admitting: Internal Medicine

## 2016-07-26 DIAGNOSIS — Z79899 Other long term (current) drug therapy: Secondary | ICD-10-CM | POA: Diagnosis not present

## 2016-07-26 DIAGNOSIS — I4892 Unspecified atrial flutter: Secondary | ICD-10-CM | POA: Insufficient documentation

## 2016-07-26 DIAGNOSIS — E119 Type 2 diabetes mellitus without complications: Secondary | ICD-10-CM | POA: Insufficient documentation

## 2016-07-26 DIAGNOSIS — K219 Gastro-esophageal reflux disease without esophagitis: Secondary | ICD-10-CM | POA: Insufficient documentation

## 2016-07-26 DIAGNOSIS — I2510 Atherosclerotic heart disease of native coronary artery without angina pectoris: Secondary | ICD-10-CM | POA: Diagnosis not present

## 2016-07-26 DIAGNOSIS — I4891 Unspecified atrial fibrillation: Secondary | ICD-10-CM | POA: Diagnosis not present

## 2016-07-26 DIAGNOSIS — Z951 Presence of aortocoronary bypass graft: Secondary | ICD-10-CM | POA: Diagnosis not present

## 2016-07-26 DIAGNOSIS — Z8673 Personal history of transient ischemic attack (TIA), and cerebral infarction without residual deficits: Secondary | ICD-10-CM | POA: Insufficient documentation

## 2016-07-26 DIAGNOSIS — M940 Chondrocostal junction syndrome [Tietze]: Secondary | ICD-10-CM | POA: Diagnosis not present

## 2016-07-26 DIAGNOSIS — R51 Headache: Secondary | ICD-10-CM | POA: Insufficient documentation

## 2016-07-26 DIAGNOSIS — Z87891 Personal history of nicotine dependence: Secondary | ICD-10-CM | POA: Diagnosis not present

## 2016-07-26 DIAGNOSIS — Z7901 Long term (current) use of anticoagulants: Secondary | ICD-10-CM | POA: Insufficient documentation

## 2016-07-26 DIAGNOSIS — I10 Essential (primary) hypertension: Secondary | ICD-10-CM | POA: Insufficient documentation

## 2016-07-26 DIAGNOSIS — R0789 Other chest pain: Secondary | ICD-10-CM | POA: Diagnosis present

## 2016-07-26 DIAGNOSIS — F410 Panic disorder [episodic paroxysmal anxiety] without agoraphobia: Secondary | ICD-10-CM | POA: Diagnosis not present

## 2016-07-26 DIAGNOSIS — I252 Old myocardial infarction: Secondary | ICD-10-CM | POA: Diagnosis not present

## 2016-07-26 DIAGNOSIS — R079 Chest pain, unspecified: Secondary | ICD-10-CM | POA: Diagnosis present

## 2016-07-26 DIAGNOSIS — Z7984 Long term (current) use of oral hypoglycemic drugs: Secondary | ICD-10-CM | POA: Diagnosis not present

## 2016-07-26 LAB — CBC
HCT: 34.6 % — ABNORMAL LOW (ref 35.0–47.0)
Hemoglobin: 11.4 g/dL — ABNORMAL LOW (ref 12.0–16.0)
MCH: 31.7 pg (ref 26.0–34.0)
MCHC: 33 g/dL (ref 32.0–36.0)
MCV: 96.3 fL (ref 80.0–100.0)
Platelets: 369 10*3/uL (ref 150–440)
RBC: 3.59 MIL/uL — ABNORMAL LOW (ref 3.80–5.20)
RDW: 16.4 % — ABNORMAL HIGH (ref 11.5–14.5)
WBC: 9.5 10*3/uL (ref 3.6–11.0)

## 2016-07-26 LAB — URINALYSIS, COMPLETE (UACMP) WITH MICROSCOPIC
Bacteria, UA: NONE SEEN
Bilirubin Urine: NEGATIVE
Glucose, UA: NEGATIVE mg/dL
Hgb urine dipstick: NEGATIVE
Ketones, ur: NEGATIVE mg/dL
Nitrite: NEGATIVE
Protein, ur: 30 mg/dL — AB
Specific Gravity, Urine: 1.023 (ref 1.005–1.030)
pH: 6 (ref 5.0–8.0)

## 2016-07-26 LAB — BASIC METABOLIC PANEL
Anion gap: 11 (ref 5–15)
BUN: 10 mg/dL (ref 6–20)
CO2: 25 mmol/L (ref 22–32)
Calcium: 9.2 mg/dL (ref 8.9–10.3)
Chloride: 104 mmol/L (ref 101–111)
Creatinine, Ser: 0.6 mg/dL (ref 0.44–1.00)
GFR calc Af Amer: >60 mL/min (ref >60)
GFR calc non Af Amer: >60 mL/min (ref >60)
Glucose, Bld: 133 mg/dL — ABNORMAL HIGH (ref 65–99)
Potassium: 3.9 mmol/L (ref 3.5–5.1)
Sodium: 140 mmol/L (ref 135–145)

## 2016-07-26 LAB — TROPONIN I
Troponin I: <0.03 ng/mL (ref <0.03)
Troponin I: <0.03 ng/mL (ref <0.03)
Troponin I: <0.03 ng/mL (ref <0.03)
Troponin I: <0.03 ng/mL (ref <0.03)

## 2016-07-26 LAB — GLUCOSE, CAPILLARY
Glucose-Capillary: 128 mg/dL — ABNORMAL HIGH (ref 65–99)
Glucose-Capillary: 102 mg/dL — ABNORMAL HIGH (ref 65–99)

## 2016-07-26 MED ORDER — MORPHINE SULFATE (PF) 4 MG/ML IV SOLN
4.0000 mg | Freq: Once | INTRAVENOUS | Status: AC
  Administered 2016-07-26: 4 mg via INTRAVENOUS
  Filled 2016-07-26: qty 1

## 2016-07-26 MED ORDER — PANTOPRAZOLE SODIUM 40 MG PO TBEC
40.0000 mg | DELAYED_RELEASE_TABLET | Freq: Every day | ORAL | Status: DC
  Administered 2016-07-27: 40 mg via ORAL
  Filled 2016-07-26: qty 1

## 2016-07-26 MED ORDER — METOPROLOL TARTRATE 50 MG PO TABS
50.0000 mg | ORAL_TABLET | Freq: Two times a day (BID) | ORAL | Status: DC
  Administered 2016-07-26 – 2016-07-27 (×2): 50 mg via ORAL
  Filled 2016-07-26 (×2): qty 1

## 2016-07-26 MED ORDER — ONDANSETRON HCL 4 MG/2ML IJ SOLN: 4.0000 mg | Freq: Four times a day (QID) | INTRAMUSCULAR | Status: DC | PRN

## 2016-07-26 MED ORDER — INSULIN ASPART 100 UNIT/ML ~~LOC~~ SOLN
0.0000 [IU] | Freq: Three times a day (TID) | SUBCUTANEOUS | Status: DC
  Administered 2016-07-26 – 2016-07-27 (×2): 1 [IU] via SUBCUTANEOUS
  Filled 2016-07-26 (×2): qty 1

## 2016-07-26 MED ORDER — LORATADINE 10 MG PO TABS: 10.0000 mg | ORAL_TABLET | Freq: Every day | ORAL | Status: DC | PRN

## 2016-07-26 MED ORDER — INSULIN ASPART 100 UNIT/ML ~~LOC~~ SOLN: 0.0000 [IU] | Freq: Every day | SUBCUTANEOUS | Status: DC

## 2016-07-26 MED ORDER — TRAMADOL HCL 50 MG PO TABS
50.0000 mg | ORAL_TABLET | Freq: Four times a day (QID) | ORAL | Status: DC | PRN
  Administered 2016-07-26 – 2016-07-27 (×4): 50 mg via ORAL
  Filled 2016-07-26 (×3): qty 1

## 2016-07-26 MED ORDER — ONDANSETRON HCL 4 MG/2ML IJ SOLN
4.0000 mg | Freq: Once | INTRAMUSCULAR | Status: AC
  Administered 2016-07-26: 4 mg via INTRAVENOUS
  Filled 2016-07-26: qty 2

## 2016-07-26 MED ORDER — ACETAMINOPHEN 325 MG PO TABS
650.0000 mg | ORAL_TABLET | ORAL | Status: DC | PRN
  Administered 2016-07-26: 650 mg via ORAL
  Filled 2016-07-26: qty 2

## 2016-07-26 MED ORDER — ROSUVASTATIN CALCIUM 20 MG PO TABS
40.0000 mg | ORAL_TABLET | Freq: Every day | ORAL | Status: DC
  Administered 2016-07-26: 40 mg via ORAL
  Filled 2016-07-26: qty 2

## 2016-07-26 MED ORDER — NITROGLYCERIN 0.4 MG SL SUBL: 0.4000 mg | SUBLINGUAL_TABLET | SUBLINGUAL | Status: DC | PRN

## 2016-07-26 MED ORDER — ASPIRIN EC 325 MG PO TBEC
325.0000 mg | DELAYED_RELEASE_TABLET | Freq: Every day | ORAL | Status: DC
  Administered 2016-07-26 – 2016-07-27 (×2): 325 mg via ORAL
  Filled 2016-07-26 (×2): qty 1

## 2016-07-26 MED ORDER — APIXABAN 5 MG PO TABS
5.0000 mg | ORAL_TABLET | Freq: Every day | ORAL | Status: DC
  Administered 2016-07-26 – 2016-07-27 (×2): 5 mg via ORAL
  Filled 2016-07-26 (×2): qty 1

## 2016-07-26 MED ORDER — TRAMADOL HCL 50 MG PO TABS
ORAL_TABLET | ORAL | Status: AC
  Administered 2016-07-26: 50 mg via ORAL
  Filled 2016-07-26: qty 1

## 2016-07-26 NOTE — ED Provider Notes (Signed)
[facility] Emergency Department Provider Note  ____________________________________________   First MD Initiated Contact with Patient 07/26/16 [phone]     (approximate)  I have reviewed the triage vital signs and the nursing notes.   HISTORY  Chief Complaint Chest Pain   HPI Candace [name] is a 66 y.o. female with a history of coronary artery disease and atrial fibrillation on eliquis who is presenting to the emergency department with a week worth of chest pain. Says that the chest pain has been intermittent but as of last night has been constant and is a pressure and sharp type pain. She says the pain right now 7-8 out of 10 and is under the left breast and radiates to the left lateral thorax. She says that she has had diaphoresis and some nausea associated with it. Patient has not taken any nitroglycerin at home. Is otherwise compliant with her medications. Says this pain feels differently than her previous heart attacks. She is a patient of Dr. Alveria Apley. She is also concerned about chronic headaches since this past January and her daughter thinks that they may be a result of mold because of facial swelling. She has seen ENT as well as neurology and had a negative biopsy for temporal arteritis.   Past Medical History:  Diagnosis Date  . Anxiety   . Arrhythmia   . Arthritis   . Coronary artery disease   . Diabetes mellitus without complication ([place])   . Dyspnea    doe  . Dysrhythmia   . GERD (gastroesophageal reflux disease)   . Headache   . Hyperlipidemia   . Hypertension   . Myocardial infarction ([place])    2016  . Myocardial infarction with cardiac rehabilitation [facility])    MI 2016/ CABG 8/17    FINISHED CARDIAC REHAB 3 WEEKS AGO  . Panic attack   . Reflux   . Stroke ([place])   . Voice tremor     Patient Active Problem List   Diagnosis Date Noted  . Snoring 02/13/2016  . Bradycardia 12/31/2015  . Asthma 11/11/2015  . S/P CABG x 4 11/10/2015    . Coronary artery disease 11/07/2015  . Benign essential HTN 10/28/2014  . Episode of syncope 10/25/2014  . Carotid artery narrowing 02/08/2014  . Chest pain 02/08/2014  . Mixed hyperlipidemia 02/08/2014  . Atypical chest pain 02/07/2014  . Arteriosclerosis of coronary artery 02/07/2014  . Essential (primary) hypertension 02/07/2014  . Awareness of heartbeats 02/07/2014  . Abdominal pain 10/05/2013  . D (diarrhea) 10/05/2013  . Acid reflux 10/05/2013    Past Surgical History:  Procedure Laterality Date  . APPENDECTOMY    . ARTERY BIOPSY Right 04/26/2016   Procedure: BIOPSY TEMPORAL ARTERY;  Surgeon: Margaretha Sheffield, MD;  Location: [facility] ORS;  Service: ENT;  Laterality: Right;  . CARDIAC CATHETERIZATION N/A 11/06/2015   Procedure: Left Heart Cath and Coronary Angiography;  Surgeon: Corey Skains, MD;  Location: [place] CV LAB;  Service: Cardiovascular;  Laterality: N/A;  . CESAREAN SECTION    . CORONARY ARTERY BYPASS GRAFT N/A 11/10/2015   Procedure: CORONARY ARTERY BYPASS GRAFTING (CABG), ON PUMP, TIMES FOUR, USING LEFT INTERNAL MAMMARY ARTERY, BILATERAL GREATER SAPHENOUS VEINS HARVESTED ENDOSCOPICALLY;  Surgeon: [name], MD;  Location: [place];  Service: Open Heart Surgery;  Laterality: N/A;  LIMA-LAD; SEQ SVG-OM1-OM2; SVG-PL  . TEE WITHOUT CARDIOVERSION N/A 11/10/2015   Procedure: TRANSESOPHAGEAL ECHOCARDIOGRAM (TEE);  Surgeon: [name], MD;  Location: [place];  Service:  Open Heart Surgery;  Laterality: N/A;  . TUBAL LIGATION      Prior to Admission medications   Medication Sig Start Date End Date Taking? Authorizing Provider  acetaminophen (TYLENOL) 500 MG tablet Take 1,000 mg by mouth every 6 (six) hours as needed.   Yes Historical Provider, MD  apixaban (ELIQUIS) 5 MG TABS tablet Take 5 mg by mouth daily. 06/23/16  Yes Historical Provider, MD  cetirizine (ZYRTEC) 10 MG tablet Take 10 mg by mouth daily.   Yes Historical Provider, MD  metFORMIN (GLUCOPHAGE-XR)  750 MG 24 hr tablet Take 1 tablet (750 mg total) by mouth 2 (two) times daily. 11/18/15  Yes Wayne E Gold, PA-C  metoprolol (LOPRESSOR) 50 MG tablet Take 50 mg by mouth 2 (two) times daily.    Yes Historical Provider, MD  ondansetron (ZOFRAN ODT) 8 MG disintegrating tablet Take 1 tablet (8 mg total) by mouth every 8 (eight) hours as needed for nausea or vomiting. 06/22/16  Yes Pieter Partridge, DO  pantoprazole (PROTONIX) 40 MG tablet Take 40 mg by mouth daily.   Yes Historical Provider, MD  rosuvastatin (CRESTOR) 40 MG tablet Take 1 tablet (40 mg total) by mouth daily at 6 PM. 11/18/15  Yes Wayne E Gold, PA-C  traMADol (ULTRAM) 50 MG tablet Take 50 mg by mouth every 6 (six) hours as needed.   Yes Historical Provider, MD  aspirin EC 325 MG EC tablet Take 1 tablet (325 mg total) by mouth daily. Patient not taking: Reported on 07/26/2016 11/18/15   John Giovanni, PA-C  nortriptyline (PAMELOR) 10 MG capsule Take 1 capsule (10 mg total) by mouth at bedtime. Patient not taking: Reported on 07/26/2016 06/22/16   Pieter Partridge, DO    Allergies Penicillins  Family History  Problem Relation Age of Onset  . Cancer Father   . Hypertension Father   . Heart disease Father   . Cancer Mother   . Hypertension Mother   . Cancer Sister   . Breast cancer Sister 71  . Breast cancer Sister 70    Social History Social History  Substance Use Topics  . Smoking status: Former Smoker    Types: Cigarettes    Quit date: 10/07/2001  . Smokeless tobacco: Never Used  . Alcohol use No    Review of Systems  Constitutional: No fever/chills Eyes: No visual changes. ENT: No sore throat. Cardiovascular: Denies chest pain. Respiratory: as above Gastrointestinal: No abdominal pain.  no vomiting.  No diarrhea.  No constipation. Genitourinary: Negative for dysuria. Musculoskeletal: Negative for back pain. Skin: Negative for rash. Neurological: Negative for focal weakness or  numbness.   ____________________________________________   PHYSICAL EXAM:  VITAL SIGNS: ED Triage Vitals [07/26/16 0933]  Enc Vitals Group     BP 136/76     Pulse Rate 99     Resp 20     Temp 99 F (37.2 C)     Temp Source Oral     SpO2 99 %     Weight 154 lb (69.9 kg)     Height      Head Circumference      Peak Flow      Pain Score 8     Pain Loc      Pain Edu?      Excl. in [place]?     Constitutional: Alert and oriented. Well appearing and in no acute distress. Eyes: Conjunctivae are normal. PERRL. EOMI. Head: Atraumatic. Nose: No congestion/rhinnorhea. Mouth/Throat: Mucous membranes are  moist.   Neck: No stridor.   Cardiovascular: Normal rate, regular rhythm. Grossly normal heart sounds. Chest pain is not reproducible to palpation. Respiratory: Normal respiratory effort.  No retractions. Lungs CTAB. Gastrointestinal: Soft and nontender. No distention.  Musculoskeletal: No lower extremity tenderness nor edema.  No joint effusions. Neurologic:  Normal speech and language. No gross focal neurologic deficits are appreciated. Skin:  Skin is warm, dry and intact. No rash noted. Psychiatric: Mood and affect are normal. Speech and behavior are normal.  ____________________________________________   LABS (all labs ordered are listed, but only abnormal results are displayed)  Labs Reviewed  BASIC METABOLIC PANEL - Abnormal; Notable for the following:       Result Value   Glucose, Bld 133 (*)    All other components within normal limits  CBC - Abnormal; Notable for the following:    RBC 3.59 (*)    Hemoglobin 11.4 (*)    HCT 34.6 (*)    RDW 16.4 (*)    All other components within normal limits  TROPONIN I  TROPONIN I  URINALYSIS, COMPLETE (UACMP) WITH MICROSCOPIC   ____________________________________________  EKG  ED ECG REPORT I, Doran Stabler, the attending physician, personally viewed and interpreted this ECG.   Date: 07/26/2016  EKG Time: 0923   Rate: 104  Rhythm: sinus tachycardia  Axis: normal  Intervals:normal  ST&T Change: No ST segment elevation. No abnormal T-wave inversion.  Slight depression to the ST segment in V4 through V6.  ____________________________________________  RADIOLOGY  DG Chest 2 View (Accession [id number]) (Order [id number])  Imaging  Date: 07/26/2016 Department: [facility] EMERGENCY DEPARTMENT Released By: Donnamarie Rossetti, RN (auto-released) Authorizing: Orbie Pyo, MD  Exam Information   Status Exam Begun  Exam Ended   Final [99] 07/26/2016 10:03 AM 07/26/2016 10:13 AM  PACS Images   Show images for DG Chest 2 View  Study Result   CLINICAL DATA:  Left-sided chest pain for 2 days  EXAM: CHEST  2 VIEW  COMPARISON:  12/04/2015  FINDINGS: The heart size and mediastinal contours are within normal limits. Both lungs are clear. The visualized skeletal structures are unremarkable.  IMPRESSION: No active cardiopulmonary disease.   Electronically Signed   By: Inez Catalina M.D.   On: 07/26/2016 10:22     ____________________________________________   PROCEDURES  Procedure(s) performed:   Procedures  Critical Care performed:   ____________________________________________   INITIAL IMPRESSION / ASSESSMENT AND PLAN / ED COURSE  Pertinent labs & imaging results that were available during my care of the patient were reviewed by me and considered in my medical decision making (see chart for details).     ----------------------------------------- 11:47 AM on 07/26/2016 -----------------------------------------  Patient without any distress at this time. Will be admitted to the hospital for chest pain rule out. Signed out to Dr. Bridgett Larsson. Patient aware of the need for admission the hospital and further testing. She is understanding him on to comply. Patient had a temperature of 99. Her daughter was very concerned about this and asked that we test  her urine. Urinalysis has been ordered. However, the patient is denying any urinary complaints and I do not suspect that the temperature of 99 indicates sepsis and is not objectively fever.   ____________________________________________   FINAL CLINICAL IMPRESSION(S) / ED DIAGNOSES  Chest pain.    NEW MEDICATIONS STARTED DURING THIS VISIT:  New Prescriptions   No medications on file     Note:  This document  was prepared using Systems analyst and may include unintentional dictation errors.    Orbie Pyo, MD 07/26/16 [id number]

## 2016-07-26 NOTE — ED Notes (Signed)
Patient transported to X-ray 

## 2016-07-26 NOTE — Progress Notes (Signed)
Patient admitted to unit. Oriented to room, call bell, and staff. Bed in lowest position. Fall safety plan reviewed. Full assessment to Epic. Skin assessment verified with Thomas Hoff RN. Telemetry box verification with tele clerk and Dominica NT- Box#: [id number]. Will continue to monitor.

## 2016-07-26 NOTE — Consult Note (Signed)
[place]  CARDIOLOGY CONSULT NOTE  Patient ID: Candace [name] MRN: [id number] DOB/AGE: 10-24-1950 66 y.o.  Admit date: 07/26/2016 Referring Physician Dr. Bridgett Larsson Primary Physician   Primary Cardiologist Dr. Nehemiah Massed Reason for Consultation chest wall pain  HPI: Pt is a 66 yo female with history of cad s/p cabg in 8/17, history of atrial flutter, intermitant headache, hyperlipidemia who was admitted after presenting to the er with left lateral chest pain with radiation to her back. This is different than her angina and her initial cardiac markers are normal. EKG showed normal sinus rhythm and has no changes of ischemia. She is on eliquis for anticoagulation. . She had a temporal artery bx to evaluate for temporal arteritis. She has the pain worsened by twisting from side to side.   Review of Systems  Constitutional: Negative.   HENT: Negative.   Eyes: Negative.   Respiratory: Negative.   Cardiovascular: Positive for chest pain.  Gastrointestinal: Negative.   Genitourinary: Negative.   Musculoskeletal: Positive for myalgias.  Skin: Negative.   Neurological: Negative.   Endo/Heme/Allergies: Negative.   Psychiatric/Behavioral: Negative.     Past Medical History:  Diagnosis Date  . Anxiety   . Arrhythmia   . Arthritis   . Coronary artery disease   . Diabetes mellitus without complication ([place])   . Dyspnea    doe  . Dysrhythmia   . GERD (gastroesophageal reflux disease)   . Headache   . Hyperlipidemia   . Hypertension   . Myocardial infarction ([place])    2016  . Myocardial infarction with cardiac rehabilitation [facility])    MI 2016/ CABG 8/17    FINISHED CARDIAC REHAB 3 WEEKS AGO  . Panic attack   . Reflux   . Stroke ([place])   . Voice tremor     Family History  Problem Relation Age of Onset  . Cancer Father   . Hypertension Father   . Heart disease Father   . Cancer Mother   . Hypertension Mother   . Cancer Sister   .  Breast cancer Sister 67  . Breast cancer Sister 58    Social History   Social History  . Marital status: Divorced    Spouse name: N/A  . Number of children: N/A  . Years of education: N/A   Occupational History  . Not on file.   Social History Main Topics  . Smoking status: Former Smoker    Types: Cigarettes    Quit date: 10/07/2001  . Smokeless tobacco: Never Used  . Alcohol use No  . Drug use: No  . Sexual activity: Not Currently   Other Topics Concern  . Not on file   Social History Narrative  . No narrative on file    Past Surgical History:  Procedure Laterality Date  . APPENDECTOMY    . ARTERY BIOPSY Right 04/26/2016   Procedure: BIOPSY TEMPORAL ARTERY;  Surgeon: Margaretha Sheffield, MD;  Location: [facility] ORS;  Service: ENT;  Laterality: Right;  . CARDIAC CATHETERIZATION N/A 11/06/2015   Procedure: Left Heart Cath and Coronary Angiography;  Surgeon: Corey Skains, MD;  Location: [place] CV LAB;  Service: Cardiovascular;  Laterality: N/A;  . CESAREAN SECTION    . CORONARY ARTERY BYPASS GRAFT N/A 11/10/2015   Procedure: CORONARY ARTERY BYPASS GRAFTING (CABG), ON PUMP, TIMES FOUR, USING LEFT INTERNAL MAMMARY ARTERY, BILATERAL GREATER SAPHENOUS VEINS HARVESTED ENDOSCOPICALLY;  Surgeon: [name], MD;  Location: [place];  Service: Open Heart Surgery;  Laterality: N/A;  LIMA-LAD; SEQ SVG-OM1-OM2; SVG-PL  . TEE WITHOUT CARDIOVERSION N/A 11/10/2015   Procedure: TRANSESOPHAGEAL ECHOCARDIOGRAM (TEE);  Surgeon: [name], MD;  Location: [place];  Service: Open Heart Surgery;  Laterality: N/A;  . TUBAL LIGATION       Prescriptions Prior to Admission  Medication Sig Dispense Refill Last Dose  . acetaminophen (TYLENOL) 500 MG tablet Take 1,000 mg by mouth every 6 (six) hours as needed.   07/26/2016 at 0800  . apixaban (ELIQUIS) 5 MG TABS tablet Take 5 mg by mouth daily.   07/26/2016 at 0800  . cetirizine (ZYRTEC) 10 MG tablet Take 10 mg by mouth daily.   PRN at PRN  .  metFORMIN (GLUCOPHAGE-XR) 750 MG 24 hr tablet Take 1 tablet (750 mg total) by mouth 2 (two) times daily. 60 tablet 1 07/26/2016 at 0800  . metoprolol (LOPRESSOR) 50 MG tablet Take 50 mg by mouth 2 (two) times daily.    07/26/2016 at 0800  . ondansetron (ZOFRAN ODT) 8 MG disintegrating tablet Take 1 tablet (8 mg total) by mouth every 8 (eight) hours as needed for nausea or vomiting. 20 tablet 0 PRN at PRN  . pantoprazole (PROTONIX) 40 MG tablet Take 40 mg by mouth daily.   07/26/2016 at 0800  . rosuvastatin (CRESTOR) 40 MG tablet Take 1 tablet (40 mg total) by mouth daily at 6 PM. 30 tablet 1 07/25/2016 at 2000  . traMADol (ULTRAM) 50 MG tablet Take 50 mg by mouth every 6 (six) hours as needed.   PRN at PRN  . aspirin EC 325 MG EC tablet Take 1 tablet (325 mg total) by mouth daily. (Patient not taking: Reported on 07/26/2016)   Not Taking at Unknown time  . nortriptyline (PAMELOR) 10 MG capsule Take 1 capsule (10 mg total) by mouth at bedtime. (Patient not taking: Reported on 07/26/2016) 30 capsule 3 Not Taking at Unknown time    Physical Exam: Blood pressure 136/81, pulse (!) 102, temperature 98.7 F (37.1 C), temperature source Oral, resp. rate 16, height 5\' 3"  (1.6 m), weight 69.9 kg (154 lb), SpO2 95 %.   Wt Readings from Last 1 Encounters:  07/26/16 69.9 kg (154 lb)     General appearance: alert and cooperative Resp: clear to auscultation bilaterally Chest wall: left sided chest wall tenderness Cardio: regular rate and rhythm GI: soft, non-tender; bowel sounds normal; no masses,  no organomegaly Extremities: extremities normal, atraumatic, no cyanosis or edema Neurologic: Grossly normal  Labs:   Lab Results  Component Value Date   WBC 9.5 07/26/2016   HGB 11.4 (L) 07/26/2016   HCT 34.6 (L) 07/26/2016   MCV 96.3 07/26/2016   PLT 369 07/26/2016    Recent Labs Lab 07/26/16 0949  NA 140  K 3.9  CL 104  CO2 25  BUN 10  CREATININE 0.60  CALCIUM 9.2  GLUCOSE 133*   Lab Results   Component Value Date   CKTOTAL 91 08/01/2012   CKMB 5.9 (H) 07/14/2013   TROPONINI <0.03 07/26/2016      Radiology: no actue cardiopuomonary disease. EKG: nsr ASSESSMENT AND PLAN:  Pt with history of afib/flutter, csd s/p cabg now admittged with left lateral chest wall pain. Does not appear to be ischemic. Has ruled out for an mi and pain is atypical for her angina. Continue with current meds and follow cardiac markers. Ambulate and consider dischavge on current meds in am if stable.  Signed: Teodoro Spray  MD, [facility] 07/26/2016, 7:30 PM

## 2016-07-26 NOTE — ED Triage Notes (Signed)
Pt with left sided chest pain on and off started last  Night. Pt with hx of CABG and is on eliquis.

## 2016-07-26 NOTE — H&P (Signed)
[place] [facility] [place] NAME: Candace [name]    MR#:  [id number]  DATE OF BIRTH:  27-Jul-1950  DATE OF ADMISSION:  07/26/2016  PRIMARY CARE PHYSICIAN: [place]   REQUESTING/REFERRING PHYSICIAN: Orbie Pyo, MD  CHIEF COMPLAINT:   Chief Complaint  Patient presents with  . Chest Pain   Chest pain since last night. HISTORY OF PRESENT ILLNESS:  Candace [name]  is a 66 y.o. female with a known history of CAD, status post CABG last August, hypertension, diabetes, hyperlipidemia, troke and GERD.the patient presented to the ED with the chest pain on the left side, which is aching, intermittent and constant, exacerbated by movement without radiation.she had mild nausea but denies any vomiting, diaphoresis or shortness breath.the first troponin is normal.  PAST MEDICAL HISTORY:   Past Medical History:  Diagnosis Date  . Anxiety   . Arrhythmia   . Arthritis   . Coronary artery disease   . Diabetes mellitus without complication ([place])   . Dyspnea    doe  . Dysrhythmia   . GERD (gastroesophageal reflux disease)   . Headache   . Hyperlipidemia   . Hypertension   . Myocardial infarction ([place])    2016  . Myocardial infarction with cardiac rehabilitation [facility])    MI 2016/ CABG 8/17    FINISHED CARDIAC REHAB 3 WEEKS AGO  . Panic attack   . Reflux   . Stroke ([place])   . Voice tremor     PAST SURGICAL HISTORY:   Past Surgical History:  Procedure Laterality Date  . APPENDECTOMY    . ARTERY BIOPSY Right 04/26/2016   Procedure: BIOPSY TEMPORAL ARTERY;  Surgeon: Margaretha Sheffield, MD;  Location: [facility] ORS;  Service: ENT;  Laterality: Right;  . CARDIAC CATHETERIZATION N/A 11/06/2015   Procedure: Left Heart Cath and Coronary Angiography;  Surgeon: Corey Skains, MD;  Location: [place] CV LAB;  Service: Cardiovascular;  Laterality: N/A;  . CESAREAN SECTION    . CORONARY ARTERY BYPASS GRAFT N/A 11/10/2015   Procedure:  CORONARY ARTERY BYPASS GRAFTING (CABG), ON PUMP, TIMES FOUR, USING LEFT INTERNAL MAMMARY ARTERY, BILATERAL GREATER SAPHENOUS VEINS HARVESTED ENDOSCOPICALLY;  Surgeon: [name], MD;  Location: [place];  Service: Open Heart Surgery;  Laterality: N/A;  LIMA-LAD; SEQ SVG-OM1-OM2; SVG-PL  . TEE WITHOUT CARDIOVERSION N/A 11/10/2015   Procedure: TRANSESOPHAGEAL ECHOCARDIOGRAM (TEE);  Surgeon: [name], MD;  Location: [place];  Service: Open Heart Surgery;  Laterality: N/A;  . TUBAL LIGATION      SOCIAL HISTORY:   Social History  Substance Use Topics  . Smoking status: Former Smoker    Types: Cigarettes    Quit date: 10/07/2001  . Smokeless tobacco: Never Used  . Alcohol use No    FAMILY HISTORY:   Family History  Problem Relation Age of Onset  . Cancer Father   . Hypertension Father   . Heart disease Father   . Cancer Mother   . Hypertension Mother   . Cancer Sister   . Breast cancer Sister 27  . Breast cancer Sister 10    DRUG ALLERGIES:   Allergies  Allergen Reactions  . Penicillins Swelling, Rash and Other (See Comments)    Has patient had a PCN reaction causing immediate rash, facial/tongue/throat swelling, SOB or lightheadedness with hypotension: Yes Has patient had a PCN reaction causing severe rash involving mucus membranes or skin necrosis: Yes Has patient had a PCN reaction that  required hospitalization No Has patient had a PCN reaction occurring within the last 10 years: Yes If all of the above answers are "NO", then may proceed with Cephalosporin use.     REVIEW OF SYSTEMS:   Review of Systems  Constitutional: Negative for chills, fever and malaise/fatigue.  HENT: Negative for congestion.   Eyes: Negative for blurred vision and double vision.  Respiratory: Negative for cough, shortness of breath and stridor.   Cardiovascular: Positive for chest pain. Negative for palpitations and leg swelling.  Gastrointestinal: Positive for nausea. Negative for  abdominal pain, blood in stool, diarrhea, melena and vomiting.  Genitourinary: Negative for dysuria and hematuria.  Musculoskeletal: Negative for back pain.  Skin: Negative for itching and rash.  Neurological: Negative for dizziness, focal weakness, loss of consciousness, weakness and headaches.  Psychiatric/Behavioral: Negative for depression. The patient is not nervous/anxious.     MEDICATIONS AT HOME:   Prior to Admission medications   Medication Sig Start Date End Date Taking? Authorizing Provider  acetaminophen (TYLENOL) 500 MG tablet Take 1,000 mg by mouth every 6 (six) hours as needed.   Yes Historical Provider, MD  apixaban (ELIQUIS) 5 MG TABS tablet Take 5 mg by mouth daily. 06/23/16  Yes Historical Provider, MD  cetirizine (ZYRTEC) 10 MG tablet Take 10 mg by mouth daily.   Yes Historical Provider, MD  metFORMIN (GLUCOPHAGE-XR) 750 MG 24 hr tablet Take 1 tablet (750 mg total) by mouth 2 (two) times daily. 11/18/15  Yes Wayne E Gold, PA-C  metoprolol (LOPRESSOR) 50 MG tablet Take 50 mg by mouth 2 (two) times daily.    Yes Historical Provider, MD  ondansetron (ZOFRAN ODT) 8 MG disintegrating tablet Take 1 tablet (8 mg total) by mouth every 8 (eight) hours as needed for nausea or vomiting. 06/22/16  Yes Pieter Partridge, DO  pantoprazole (PROTONIX) 40 MG tablet Take 40 mg by mouth daily.   Yes Historical Provider, MD  rosuvastatin (CRESTOR) 40 MG tablet Take 1 tablet (40 mg total) by mouth daily at 6 PM. 11/18/15  Yes Wayne E Gold, PA-C  traMADol (ULTRAM) 50 MG tablet Take 50 mg by mouth every 6 (six) hours as needed.   Yes Historical Provider, MD  aspirin EC 325 MG EC tablet Take 1 tablet (325 mg total) by mouth daily. Patient not taking: Reported on 07/26/2016 11/18/15   John Giovanni, PA-C  nortriptyline (PAMELOR) 10 MG capsule Take 1 capsule (10 mg total) by mouth at bedtime. Patient not taking: Reported on 07/26/2016 06/22/16   Pieter Partridge, DO      VITAL SIGNS:  Blood pressure (!) 141/94,  pulse 98, temperature 99 F (37.2 C), temperature source Oral, resp. rate 18, weight 154 lb (69.9 kg), SpO2 99 %.  PHYSICAL EXAMINATION:  Physical Exam  GENERAL:  66 y.o.-year-old patient lying in the bed with no acute distress.  EYES: Pupils equal, round, reactive to light and accommodation. No scleral icterus. Extraocular muscles intact.  HEENT: Head atraumatic, normocephalic. Oropharynx and nasopharynx clear.  NECK:  Supple, no jugular venous distention. No thyroid enlargement, no tenderness.  LUNGS: Normal breath sounds bilaterally, no wheezing, rales,rhonchi or crepitation. No use of accessory muscles of respiration.  CARDIOVASCULAR: S1, S2 normal. No murmurs, rubs, or gallops. Chest wall tenderness on the left side on palpation. ABDOMEN: Soft, nontender, nondistended. Bowel sounds present. No organomegaly or mass.  EXTREMITIES: No pedal edema, cyanosis, or clubbing.  NEUROLOGIC: Cranial nerves II through XII are intact. Muscle strength 5/5 in all  extremities. Sensation intact. Gait not checked.  PSYCHIATRIC: The patient is alert and oriented x 3.  SKIN: No obvious rash, lesion, or ulcer.   LABORATORY PANEL:   CBC  Recent Labs Lab 07/26/16 0949  WBC 9.5  HGB 11.4*  HCT 34.6*  PLT 369   ------------------------------------------------------------------------------------------------------------------  Chemistries   Recent Labs Lab 07/26/16 0949  NA 140  K 3.9  CL 104  CO2 25  GLUCOSE 133*  BUN 10  CREATININE 0.60  CALCIUM 9.2   ------------------------------------------------------------------------------------------------------------------  Cardiac Enzymes  Recent Labs Lab 07/26/16 1235  TROPONINI <0.03   ------------------------------------------------------------------------------------------------------------------  RADIOLOGY:  Dg Chest 2 View  Result Date: 07/26/2016 CLINICAL DATA:  Left-sided chest pain for 2 days EXAM: CHEST  2 VIEW COMPARISON:   12/04/2015 FINDINGS: The heart size and mediastinal contours are within normal limits. Both lungs are clear. The visualized skeletal structures are unremarkable. IMPRESSION: No active cardiopulmonary disease. Electronically Signed   By: Inez Catalina M.D.   On: 07/26/2016 10:22      IMPRESSION AND PLAN:   Chest pain.atypical. The patient will be placed for observation. Continue telemetry monitor, fup troponin level and a cardiology consult.  CAD status post CABG. Continue adequate is, add aspirin, continue Crestor.  Hypertension. Continue hypertension medication.  diabetes. Start a sliding scale.  All the records are reviewed and case discussed with ED provider. Management plans discussed with the patient,her daughterand they are in agreement.  CODE STATUS: full code  TOTAL TIME TAKING CARE OF THIS PATIENT: 46 minutes.    Demetrios Loll M.D on 07/26/2016 at 2:35 PM  Between 7am to 6pm - Pager - [phone]  After 6pm go to [email or web address] - [employer] [facility]  Office  [phone]  CC: Primary care physician; [facility]   Note: This dictation was prepared with Dragon dictation along with smaller phrase technology. Any transcriptional errors that result from this process are unintentional.

## 2016-07-26 NOTE — ED Notes (Signed)
Pt unable to void at this time. 

## 2016-07-27 DIAGNOSIS — M940 Chondrocostal junction syndrome [Tietze]: Secondary | ICD-10-CM | POA: Diagnosis not present

## 2016-07-27 LAB — GLUCOSE, CAPILLARY
Glucose-Capillary: 90 mg/dL (ref 65–99)
Glucose-Capillary: 150 mg/dL — ABNORMAL HIGH (ref 65–99)

## 2016-07-27 MED ORDER — NITROGLYCERIN 0.4 MG SL SUBL: 0.4000 mg | SUBLINGUAL_TABLET | SUBLINGUAL | 0 refills | Status: AC | PRN

## 2016-07-27 NOTE — Care Management (Signed)
No discharge needs identified by members of the care team 

## 2016-07-27 NOTE — Progress Notes (Signed)
       [name]  SUBJECTIVE: no further chest pain   Vitals:   07/26/16 1638 07/26/16 2214 07/27/16 0344 07/27/16 0813  BP: 136/81 (!) 156/90 (!) 161/93 (!) 146/89  Pulse: (!) 102 96 91 96  Resp: 16  16 18   Temp: 98.7 F (37.1 C)  97.8 F (36.6 C) 97.8 F (36.6 C)  TempSrc: Oral  Oral Oral  SpO2: 95%  99% 99%  Weight:      Height: 5\' 3"  (1.6 m)       Intake/Output Summary (Last 24 hours) at 07/27/16 1227 Last data filed at 07/27/16 0949  Gross per 24 hour  Intake              360 ml  Output              400 ml  Net              -40 ml    LABS: Basic Metabolic Panel:  Recent Labs  07/26/16 0949  NA 140  K 3.9  CL 104  CO2 25  GLUCOSE 133*  BUN 10  CREATININE 0.60  CALCIUM 9.2   Liver Function Tests: No results for input(s): AST, ALT, ALKPHOS, BILITOT, PROT, ALBUMIN in the last 72 hours. No results for input(s): LIPASE, AMYLASE in the last 72 hours. CBC:  Recent Labs  07/26/16 0949  WBC 9.5  HGB 11.4*  HCT 34.6*  MCV 96.3  PLT 369   Cardiac Enzymes:  Recent Labs  07/26/16 1235 07/26/16 1648 07/26/16 1931  TROPONINI <0.03 <0.03 <0.03   BNP: Invalid input(s): POCBNP D-Dimer: No results for input(s): DDIMER in the last 72 hours. Hemoglobin A1C: No results for input(s): HGBA1C in the last 72 hours. Fasting Lipid Panel: No results for input(s): CHOL, HDL, LDLCALC, TRIG, CHOLHDL, LDLDIRECT in the last 72 hours. Thyroid Function Tests: No results for input(s): TSH, T4TOTAL, T3FREE, THYROIDAB in the last 72 hours.  Invalid input(s): FREET3 Anemia Panel: No results for input(s): VITAMINB12, FOLATE, FERRITIN, TIBC, IRON, RETICCTPCT in the last 72 hours.   Physical Exam: Blood pressure (!) 146/89, pulse 96, temperature 97.8 F (36.6 C), temperature source Oral, resp. rate 18, height 5\' 3"  (1.6 m), weight 69.9 kg (154 lb), SpO2 99 %.   Wt Readings from Last 1 Encounters:  07/26/16 69.9 kg (154  lb)     General appearance: alert and cooperative Resp: clear to auscultation bilaterally Chest wall: left sided chest wall tenderness Cardio: regular rate and rhythm Extremities: extremities normal, atraumatic, no cyanosis or edema  TELEMETRY: Reviewed telemetry pt in nsr  ASSESSMENT AND PLAN:  Active Problems:   Chest pain  -ruled out for an mi. Stable with no further chest pain. OK for dischargre from cardiac stadpoint Non ischemic chest pain. Continue current meds. Follow up with Dr. Nehemiah Massed in 1-2 weeks.   Teodoro Spray, MD, [facility] 07/27/2016 12:27 PM

## 2016-07-27 NOTE — Care Management Obs Status (Signed)
[place] NOTIFICATION   Patient Details  Name: ALISANDRA SON MRN: [id number] Date of Birth: [birth year]   Medicare Observation Status Notification Given:  Yes    Katrina Stack, RN 07/27/2016, 11:24 AM

## 2016-07-27 NOTE — Discharge Summary (Addendum)
[place] [facility] [place] NAME: Candace [name]    MR#:  [id number]  DATE OF BIRTH:  [birth year]  DATE OF ADMISSION:  07/26/2016 ADMITTING PHYSICIAN: Demetrios Loll, MD  DATE OF DISCHARGE: 07/27/2016  PRIMARY CARE PHYSICIAN: [place]    ADMISSION DIAGNOSIS:  Chest pain, unspecified type [R07.9]  DISCHARGE DIAGNOSIS:  Active Problems:   Chest pain   SECONDARY DIAGNOSIS:   Past Medical History:  Diagnosis Date  . Anxiety   . Arrhythmia   . Arthritis   . Coronary artery disease   . Diabetes mellitus without complication ([place])   . Dyspnea    doe  . Dysrhythmia   . GERD (gastroesophageal reflux disease)   . Headache   . Hyperlipidemia   . Hypertension   . Myocardial infarction ([employer])    2016  . Myocardial infarction with cardiac rehabilitation [facility])    MI 2016/ CABG 8/17    FINISHED CARDIAC REHAB 3 WEEKS AGO  . Panic attack   . Reflux   . Stroke ([place])   . Voice tremor     HOSPITAL COURSE:  66 year old female with a history of CAD/CABG in August 2017, atrial flutter and chronic headaches who presented with left lateral chest pain.  1. Chest pain: Patient was placed on telemetry unit. Cardiac enzymes were negative. Telemetry monitoring showed no acute arrhythmia. Patient is about by cardiology. Patient has costochondritis that she is tender on the left lateral side. Patient would benefit from NSAIDs for costochondritis however she is unable to take these medications.  Patient's chest pain is not due to ACS or cardiac disease.  2. Chronic headaches: Patient is referred to pain clinician.  3. History of CAD/CABG: Patient will continue aspirin, metoprolol and statin  4. Diabetes: Patient will continue ADA diet and metformin.  5. History of atrial flutter: Continue anticoagulation with Eliquis DISCHARGE CONDITIONS AND DIET:   Stable Heart healthy and diabetic diet  CONSULTS OBTAINED:  Treatment Team:  Teodoro Spray, MD Corey Skains, MD  DRUG ALLERGIES:   Allergies  Allergen Reactions  . Penicillins Swelling, Rash and Other (See Comments)    Has patient had a PCN reaction causing immediate rash, facial/tongue/throat swelling, SOB or lightheadedness with hypotension: Yes Has patient had a PCN reaction causing severe rash involving mucus membranes or skin necrosis: Yes Has patient had a PCN reaction that required hospitalization No Has patient had a PCN reaction occurring within the last 10 years: Yes If all of the above answers are "NO", then may proceed with Cephalosporin use.     DISCHARGE MEDICATIONS:   Current Discharge Medication List    CONTINUE these medications which have NOT CHANGED   Details  acetaminophen (TYLENOL) 500 MG tablet Take 1,000 mg by mouth every 6 (six) hours as needed.    apixaban (ELIQUIS) 5 MG TABS tablet Take 5 mg by BID    cetirizine (ZYRTEC) 10 MG tablet Take 10 mg by mouth daily.    metFORMIN (GLUCOPHAGE-XR) 750 MG 24 hr tablet Take 1 tablet (750 mg total) by mouth 2 (two) times daily. Qty: 60 tablet, Refills: 1    metoprolol (LOPRESSOR) 50 MG tablet Take 50 mg by mouth 2 (two) times daily.     ondansetron (ZOFRAN ODT) 8 MG disintegrating tablet Take 1 tablet (8 mg total) by mouth every 8 (eight) hours as needed for nausea or vomiting. Qty: 20 tablet, Refills: 0    pantoprazole (PROTONIX) 40 MG tablet  Take 40 mg by mouth daily.    rosuvastatin (CRESTOR) 40 MG tablet Take 1 tablet (40 mg total) by mouth daily at 6 PM. Qty: 30 tablet, Refills: 1    traMADol (ULTRAM) 50 MG tablet Take 50 mg by mouth every 6 (six) hours as needed.    aspirin EC 325 MG EC tablet Take 1 tablet (325 mg total) by mouth daily.    nortriptyline (PAMELOR) 10 MG capsule Take 1 capsule (10 mg total) by mouth at bedtime. Qty: 30 capsule, Refills: 3          Today   CHIEF COMPLAINT:  Patient doing well this morning.   VITAL SIGNS:  Blood pressure (!) 146/89,  pulse 96, temperature 97.8 F (36.6 C), temperature source Oral, resp. rate 18, height 5\' 3"  (1.6 m), weight 69.9 kg (154 lb), SpO2 99 %.   REVIEW OF SYSTEMS:  Review of Systems  Constitutional: Negative.  Negative for chills, fever and malaise/fatigue.  HENT: Negative.  Negative for ear discharge, ear pain, hearing loss, nosebleeds and sore throat.   Eyes: Negative.  Negative for blurred vision and pain.  Respiratory: Negative.  Negative for cough, hemoptysis, shortness of breath and wheezing.   Cardiovascular: Negative for chest pain, palpitations and leg swelling.       Pain between ribs  Gastrointestinal: Negative.  Negative for abdominal pain, blood in stool, diarrhea, nausea and vomiting.  Genitourinary: Negative.  Negative for dysuria.  Musculoskeletal: Negative.  Negative for back pain.  Skin: Negative.   Neurological: Negative for dizziness, tremors, speech change, focal weakness, seizures and headaches.  Endo/Heme/Allergies: Negative.  Does not bruise/bleed easily.  Psychiatric/Behavioral: Negative.  Negative for depression, hallucinations and suicidal ideas.     PHYSICAL EXAMINATION:  GENERAL:  66 y.o.-year-old patient lying in the bed with no acute distress.  NECK:  Supple, no jugular venous distention. No thyroid enlargement, no tenderness.  LUNGS: Normal breath sounds bilaterally, no wheezing, rales,rhonchi  No use of accessory muscles of respiration.  CARDIOVASCULAR: S1, S2 normal. No murmurs, rubs, or gallops.  She has costochronditis pain left sid ABDOMEN: Soft, non-tender, non-distended. Bowel sounds present. No organomegaly or mass.  EXTREMITIES: No pedal edema, cyanosis, or clubbing.  PSYCHIATRIC: The patient is alert and oriented x 3.  SKIN: No obvious rash, lesion, or ulcer.   DATA REVIEW:   CBC  Recent Labs Lab 07/26/16 0949  WBC 9.5  HGB 11.4*  HCT 34.6*  PLT 369    Chemistries   Recent Labs Lab 07/26/16 0949  NA 140  K 3.9  CL 104  CO2  25  GLUCOSE 133*  BUN 10  CREATININE 0.60  CALCIUM 9.2    Cardiac Enzymes  Recent Labs Lab 07/26/16 1235 07/26/16 1648 07/26/16 1931  TROPONINI <0.03 <0.03 <0.03    Microbiology Results  @[email or web address] @  RADIOLOGY:  Dg Chest 2 View  Result Date: 07/26/2016 CLINICAL DATA:  Left-sided chest pain for 2 days EXAM: CHEST  2 VIEW COMPARISON:  12/04/2015 FINDINGS: The heart size and mediastinal contours are within normal limits. Both lungs are clear. The visualized skeletal structures are unremarkable. IMPRESSION: No active cardiopulmonary disease. Electronically Signed   By: Inez Catalina M.D.   On: 07/26/2016 10:22      Current Discharge Medication List    CONTINUE these medications which have NOT CHANGED   Details  acetaminophen (TYLENOL) 500 MG tablet Take 1,000 mg by mouth every 6 (six) hours as needed.    apixaban (ELIQUIS) 5 MG TABS tablet  Take 5 mg by mouth daily.    cetirizine (ZYRTEC) 10 MG tablet Take 10 mg by mouth daily.    metFORMIN (GLUCOPHAGE-XR) 750 MG 24 hr tablet Take 1 tablet (750 mg total) by mouth 2 (two) times daily. Qty: 60 tablet, Refills: 1    metoprolol (LOPRESSOR) 50 MG tablet Take 50 mg by mouth 2 (two) times daily.     ondansetron (ZOFRAN ODT) 8 MG disintegrating tablet Take 1 tablet (8 mg total) by mouth every 8 (eight) hours as needed for nausea or vomiting. Qty: 20 tablet, Refills: 0    pantoprazole (PROTONIX) 40 MG tablet Take 40 mg by mouth daily.    rosuvastatin (CRESTOR) 40 MG tablet Take 1 tablet (40 mg total) by mouth daily at 6 PM. Qty: 30 tablet, Refills: 1    traMADol (ULTRAM) 50 MG tablet Take 50 mg by mouth every 6 (six) hours as needed.    aspirin EC 325 MG EC tablet Take 1 tablet (325 mg total) by mouth daily.    nortriptyline (PAMELOR) 10 MG capsule Take 1 capsule (10 mg total) by mouth at bedtime. Qty: 30 capsule, Refills: 3           Management plans discussed with the patient and she is in agreement. Stable for  discharge home  Patient should follow up with pcp  CODE STATUS:     Code Status Orders        Start     Ordered   07/26/16 1638  Full code  Continuous     07/26/16 1639    Code Status History    Date Active Date Inactive Code Status Order ID Comments User Context   11/07/2015  5:11 PM 11/10/2015  3:52 PM Full Code [id number]  John Giovanni, PA-C Inpatient   11/05/2015  9:55 PM 11/07/2015  4:46 PM Full Code [id number]  Henreitta Leber, MD Inpatient    Advance Directive Documentation     Most Recent Value  Type of Advance Directive  Healthcare Power of [place], Living will  Pre-existing out of facility DNR order (yellow form or pink MOST form)  -  "MOST" Form in Place?  -      TOTAL TIME TAKING CARE OF THIS PATIENT: 37 minutes.    Note: This dictation was prepared with Dragon dictation along with smaller phrase technology. Any transcriptional errors that result from this process are unintentional.  Keyonda Bickle M.D on 07/27/2016 at 10:34 AM  Between 7am to 6pm - Pager - [phone] After 6pm go to [email or web address] - password [email or web address] [facility]  Office  [phone]  CC: Primary care physician; [facility]

## 2016-07-28 ENCOUNTER — Ambulatory Visit (INDEPENDENT_AMBULATORY_CARE_PROVIDER_SITE_OTHER): Payer: Medicare Other | Admitting: Family Medicine

## 2016-07-28 ENCOUNTER — Encounter: Payer: Self-pay | Admitting: Family Medicine

## 2016-07-28 ENCOUNTER — Telehealth: Payer: Self-pay | Admitting: Neurology

## 2016-07-28 VITALS — BP 96/60 | HR 92 | Temp 98.6°F | Resp 16 | Ht 63.0 in | Wt 154.0 lb

## 2016-07-28 DIAGNOSIS — G500 Trigeminal neuralgia: Secondary | ICD-10-CM | POA: Diagnosis not present

## 2016-07-28 DIAGNOSIS — Z7689 Persons encountering health services in other specified circumstances: Secondary | ICD-10-CM

## 2016-07-28 MED ORDER — OXCARBAZEPINE 300 MG PO TABS: 300.0000 mg | ORAL_TABLET | Freq: Two times a day (BID) | ORAL | 1 refills | Status: DC

## 2016-07-28 NOTE — Telephone Encounter (Signed)
Caller: Patient's daughter Candace [name] called  Urgent? No  Reason for the call: Candace [name]'s Heart Doctor (Candace [name]) wanted her to stop her Nortriptyline and to start her on Oxcarbazepine. The medication was affecting her heart. She also has a new PCP Candace [name]. Please call the daughter if you have any questions. Thanks

## 2016-07-28 NOTE — Patient Instructions (Addendum)
Contact neurologist about taking the Oxcarbazepine for possible trigeminal neuralgia. Get a carbon monoxide detector for the apartment or relocate. Follow up in 1-2 weeks.

## 2016-07-28 NOTE — Progress Notes (Signed)
Subjective:  HPI Pt is here today to establish care. She was recently in the hospital for chest pain and told her that it was a pulled muscle. Pt reports that it has gotten better. Pt has been having the pre syncope spells since January. It was thought to be her heart and had CABGx4. But she is still having these spells also having headaches that are sharp pains. Pt has an appointment for a neurologist at [facility] this month.  Prior to Admission medications   Medication Sig Start Date End Date Taking? Authorizing Provider  acetaminophen (TYLENOL) 500 MG tablet Take 1,000 mg by mouth every 6 (six) hours as needed.    Historical Provider, MD  apixaban (ELIQUIS) 5 MG TABS tablet Take 5 mg by mouth daily. 06/23/16   Historical Provider, MD  aspirin EC 325 MG EC tablet Take 1 tablet (325 mg total) by mouth daily. Patient not taking: Reported on 07/26/2016 11/18/15   John Giovanni, PA-C  cetirizine (ZYRTEC) 10 MG tablet Take 10 mg by mouth daily.    Historical Provider, MD  metFORMIN (GLUCOPHAGE-XR) 750 MG 24 hr tablet Take 1 tablet (750 mg total) by mouth 2 (two) times daily. 11/18/15   Wayne E Gold, PA-C  metoprolol (LOPRESSOR) 50 MG tablet Take 50 mg by mouth 2 (two) times daily.     Historical Provider, MD  nitroGLYCERIN (NITROSTAT) 0.4 MG SL tablet Place 1 tablet (0.4 mg total) under the tongue every 5 (five) minutes as needed for chest pain. 07/27/16   Bettey Costa, MD  nortriptyline (PAMELOR) 10 MG capsule Take 1 capsule (10 mg total) by mouth at bedtime. Patient not taking: Reported on 07/26/2016 06/22/16   Pieter Partridge, DO  ondansetron (ZOFRAN ODT) 8 MG disintegrating tablet Take 1 tablet (8 mg total) by mouth every 8 (eight) hours as needed for nausea or vomiting. 06/22/16   Pieter Partridge, DO  pantoprazole (PROTONIX) 40 MG tablet Take 40 mg by mouth daily.    Historical Provider, MD  rosuvastatin (CRESTOR) 40 MG tablet Take 1 tablet (40 mg total) by mouth daily at 6 PM. 11/18/15   Wayne E Gold, PA-C    traMADol (ULTRAM) 50 MG tablet Take 50 mg by mouth every 6 (six) hours as needed.    Historical Provider, MD    Patient Active Problem List   Diagnosis Date Noted  . Snoring 02/13/2016  . Bradycardia 12/31/2015  . Asthma 11/11/2015  . S/P CABG x 4 11/10/2015  . Coronary artery disease 11/07/2015  . Benign essential HTN 10/28/2014  . Episode of syncope 10/25/2014  . Carotid artery narrowing 02/08/2014  . Chest pain 02/08/2014  . Mixed hyperlipidemia 02/08/2014  . Atypical chest pain 02/07/2014  . Arteriosclerosis of coronary artery 02/07/2014  . Essential (primary) hypertension 02/07/2014  . Awareness of heartbeats 02/07/2014  . Abdominal pain 10/05/2013  . D (diarrhea) 10/05/2013  . Acid reflux 10/05/2013    Past Medical History:  Diagnosis Date  . Allergy   . Anemia   . Anxiety   . Arrhythmia   . Arthritis   . Coronary artery disease   . Depression   . Diabetes mellitus without complication ([place])   . Dyspnea    doe  . Dysrhythmia   . GERD (gastroesophageal reflux disease)   . Headache   . Hyperlipidemia   . Hypertension   . Myocardial infarction ([place])    2016  . Myocardial infarction with cardiac rehabilitation [facility])    MI  2016/ CABG 8/17    FINISHED CARDIAC REHAB 3 WEEKS AGO  . Panic attack   . Reflux   . Stroke ([place])   . Voice tremor     Social History   Social History  . Marital status: Divorced    Spouse name: N/A  . Number of children: N/A  . Years of education: N/A   Occupational History  . Not on file.   Social History Main Topics  . Smoking status: Former Smoker    Packs/day: 0.50    Types: Cigarettes    Quit date: 10/07/2001  . Smokeless tobacco: Never Used  . Alcohol use No  . Drug use: No  . Sexual activity: Not Currently   Other Topics Concern  . Not on file   Social History Narrative  . No narrative on file    Allergies  Allergen Reactions  . Penicillins Swelling, Rash and Other (See Comments)    Has patient had a PCN  reaction causing immediate rash, facial/tongue/throat swelling, SOB or lightheadedness with hypotension: Yes Has patient had a PCN reaction causing severe rash involving mucus membranes or skin necrosis: Yes Has patient had a PCN reaction that required hospitalization No Has patient had a PCN reaction occurring within the last 10 years: Yes If all of the above answers are "NO", then may proceed with Cephalosporin use.     Review of Systems  Constitutional: Positive for malaise/fatigue.  HENT: Positive for nosebleeds, sinus pain and sore throat.   Eyes: Negative.   Respiratory: Positive for shortness of breath.   Cardiovascular: Negative.   Gastrointestinal: Negative.   Genitourinary: Negative.   Musculoskeletal: Negative.   Skin: Negative.   Neurological: Positive for weakness and headaches.  Endo/Heme/Allergies: Positive for environmental allergies.  Psychiatric/Behavioral: Positive for memory loss. The patient has insomnia.        Irritability and crying     Immunization History  Administered Date(s) Administered  . Influenza,inj,Quad PF,36+ Mos 12/19/2015    Objective:  There were no vitals taken for this visit.  Physical Exam  Constitutional: She is oriented to person, place, and time and well-developed, well-nourished, and in no distress.  HENT:  Head: Normocephalic and atraumatic.  Right Ear: External ear normal.  Left Ear: External ear normal.  Nose: Nose normal.  Eyes: Conjunctivae are normal.  Neck: No thyromegaly present.  Cardiovascular: Normal rate, regular rhythm and normal heart sounds.   Pulmonary/Chest: Effort normal and breath sounds normal.  Abdominal: Soft.  Neurological: She is alert and oriented to person, place, and time. GCS score is 15.  Skin: Skin is warm and dry.  Psychiatric: Mood, memory, affect and judgment normal.    Lab Results  Component Value Date   WBC 9.5 07/26/2016   HGB 11.4 (L) 07/26/2016   HCT 34.6 (L) 07/26/2016   PLT 369  07/26/2016   GLUCOSE 133 (H) 07/26/2016   CHOL 214 (H) 07/14/2013   TRIG 118 07/14/2013   HDL 36 (L) 07/14/2013   LDLCALC 154 (H) 07/14/2013   TSH 1.40 04/19/2016   INR 1.44 11/10/2015   HGBA1C 8.2 (H) 11/09/2015    CMP     Component Value Date/Time   NA 140 07/26/2016 0949   NA 137 04/29/2014 2042   K 3.9 07/26/2016 0949   K 3.5 04/29/2014 2042   CL 104 07/26/2016 0949   CL 103 04/29/2014 2042   CO2 25 07/26/2016 0949   CO2 24 04/29/2014 2042   GLUCOSE 133 (H) 07/26/2016 4098  GLUCOSE 249 (H) 04/29/2014 2042   BUN 10 07/26/2016 0949   BUN 10 04/29/2014 2042   CREATININE 0.60 07/26/2016 0949   CREATININE 0.78 04/29/2014 2042   CALCIUM 9.2 07/26/2016 0949   CALCIUM 9.8 04/29/2014 2042   PROT 6.1 (L) 05/16/2016 1838   PROT 7.6 04/29/2014 2042   ALBUMIN 3.8 05/16/2016 1838   ALBUMIN 4.0 04/29/2014 2042   AST 22 05/16/2016 1838   AST 18 04/29/2014 2042   ALT 24 05/16/2016 1838   ALT 22 04/29/2014 2042   ALKPHOS 73 05/16/2016 1838   ALKPHOS 112 04/29/2014 2042   BILITOT 1.0 05/16/2016 1838   BILITOT 0.2 04/29/2014 2042   GFRNONAA >60 07/26/2016 0949   GFRNONAA >60 04/29/2014 2042   GFRNONAA >60 09/16/2013 0523   GFRAA >60 07/26/2016 0949   GFRAA >60 04/29/2014 2042   GFRAA >60 09/16/2013 0523    Assessment and Plan :  1. Encounter to establish care   2. Trigeminal neuralgia Possible type of headache. - Oxcarbazepine (TRILEPTAL) 300 MG tablet; Take 1 tablet (300 mg total) by mouth 2 (two) times daily.  Dispense: 60 tablet; Refill: 1 3.CAD/S/P CABG   HPI, Exam, and A&P Transcribed under the direction and in the presence of Richard L. Cranford Mon, MD  Electronically Signed: Katina Dung, CMA I have done the exam and reviewed the above chart and it is accurate to the best of my knowledge. Development worker, community has been used in this note in any air is in the dictation or transcription are unintentional.  [facility] 07/28/2016 10:31 AM

## 2016-07-29 NOTE — Telephone Encounter (Signed)
I think the oxcarbazepine is worth a try.

## 2016-08-03 ENCOUNTER — Emergency Department: Payer: Medicare Other

## 2016-08-03 ENCOUNTER — Inpatient Hospital Stay
Admission: EM | Admit: 2016-08-03 | Discharge: 2016-08-06 | DRG: 247 | Disposition: A | Discharge status: Home / Self Care | Payer: Medicare Other | Attending: Internal Medicine | Admitting: Internal Medicine

## 2016-08-03 ENCOUNTER — Encounter: Payer: Self-pay | Admitting: *Deleted

## 2016-08-03 DIAGNOSIS — E785 Hyperlipidemia, unspecified: Secondary | ICD-10-CM | POA: Diagnosis present

## 2016-08-03 DIAGNOSIS — I10 Essential (primary) hypertension: Secondary | ICD-10-CM | POA: Diagnosis present

## 2016-08-03 DIAGNOSIS — I480 Paroxysmal atrial fibrillation: Secondary | ICD-10-CM | POA: Diagnosis present

## 2016-08-03 DIAGNOSIS — Z880 Allergy status to penicillin: Secondary | ICD-10-CM

## 2016-08-03 DIAGNOSIS — F419 Anxiety disorder, unspecified: Secondary | ICD-10-CM | POA: Diagnosis present

## 2016-08-03 DIAGNOSIS — I25110 Atherosclerotic heart disease of native coronary artery with unstable angina pectoris: Secondary | ICD-10-CM | POA: Diagnosis not present

## 2016-08-03 DIAGNOSIS — Z7901 Long term (current) use of anticoagulants: Secondary | ICD-10-CM

## 2016-08-03 DIAGNOSIS — E118 Type 2 diabetes mellitus with unspecified complications: Secondary | ICD-10-CM

## 2016-08-03 DIAGNOSIS — Z794 Long term (current) use of insulin: Secondary | ICD-10-CM

## 2016-08-03 DIAGNOSIS — I252 Old myocardial infarction: Secondary | ICD-10-CM

## 2016-08-03 DIAGNOSIS — K219 Gastro-esophageal reflux disease without esophagitis: Secondary | ICD-10-CM | POA: Diagnosis present

## 2016-08-03 DIAGNOSIS — Z79899 Other long term (current) drug therapy: Secondary | ICD-10-CM

## 2016-08-03 DIAGNOSIS — Z8673 Personal history of transient ischemic attack (TIA), and cerebral infarction without residual deficits: Secondary | ICD-10-CM

## 2016-08-03 DIAGNOSIS — Z955 Presence of coronary angioplasty implant and graft: Secondary | ICD-10-CM

## 2016-08-03 DIAGNOSIS — Z87891 Personal history of nicotine dependence: Secondary | ICD-10-CM

## 2016-08-03 DIAGNOSIS — R9439 Abnormal result of other cardiovascular function study: Secondary | ICD-10-CM | POA: Diagnosis present

## 2016-08-03 DIAGNOSIS — R079 Chest pain, unspecified: Secondary | ICD-10-CM

## 2016-08-03 DIAGNOSIS — E1142 Type 2 diabetes mellitus with diabetic polyneuropathy: Secondary | ICD-10-CM

## 2016-08-03 DIAGNOSIS — I200 Unstable angina: Secondary | ICD-10-CM | POA: Diagnosis present

## 2016-08-03 DIAGNOSIS — Z951 Presence of aortocoronary bypass graft: Secondary | ICD-10-CM

## 2016-08-03 DIAGNOSIS — E119 Type 2 diabetes mellitus without complications: Secondary | ICD-10-CM

## 2016-08-03 DIAGNOSIS — I2510 Atherosclerotic heart disease of native coronary artery without angina pectoris: Secondary | ICD-10-CM | POA: Diagnosis present

## 2016-08-03 LAB — BASIC METABOLIC PANEL
Anion gap: 10 (ref 5–15)
BUN: 15 mg/dL (ref 6–20)
CO2: 26 mmol/L (ref 22–32)
Calcium: 9.6 mg/dL (ref 8.9–10.3)
Chloride: 103 mmol/L (ref 101–111)
Creatinine, Ser: 0.48 mg/dL (ref 0.44–1.00)
GFR calc Af Amer: >60 mL/min (ref >60)
GFR calc non Af Amer: >60 mL/min (ref >60)
Glucose, Bld: 172 mg/dL — ABNORMAL HIGH (ref 65–99)
Potassium: 3.6 mmol/L (ref 3.5–5.1)
Sodium: 139 mmol/L (ref 135–145)

## 2016-08-03 LAB — CBC
HCT: 34.9 % — ABNORMAL LOW (ref 35.0–47.0)
Hemoglobin: 11.7 g/dL — ABNORMAL LOW (ref 12.0–16.0)
MCH: 32.3 pg (ref 26.0–34.0)
MCHC: 33.5 g/dL (ref 32.0–36.0)
MCV: 96.3 fL (ref 80.0–100.0)
Platelets: 329 10*3/uL (ref 150–440)
RBC: 3.62 MIL/uL — ABNORMAL LOW (ref 3.80–5.20)
RDW: 15.4 % — ABNORMAL HIGH (ref 11.5–14.5)
WBC: 9.8 10*3/uL (ref 3.6–11.0)

## 2016-08-03 LAB — TROPONIN I
Troponin I: <0.03 ng/mL (ref <0.03)
Troponin I: <0.03 ng/mL (ref <0.03)

## 2016-08-03 MED ORDER — OXCARBAZEPINE 300 MG PO TABS
300.0000 mg | ORAL_TABLET | Freq: Two times a day (BID) | ORAL | Status: DC
  Administered 2016-08-03 – 2016-08-06 (×6): 300 mg via ORAL
  Filled 2016-08-03 (×6): qty 1

## 2016-08-03 MED ORDER — APIXABAN 5 MG PO TABS
5.0000 mg | ORAL_TABLET | Freq: Two times a day (BID) | ORAL | Status: DC
  Administered 2016-08-03: 5 mg via ORAL
  Filled 2016-08-03: qty 1

## 2016-08-03 MED ORDER — SODIUM CHLORIDE 0.9 % IV SOLN
INTRAVENOUS | Status: DC
  Administered 2016-08-03: via INTRAVENOUS

## 2016-08-03 MED ORDER — ACETAMINOPHEN 325 MG PO TABS
650.0000 mg | ORAL_TABLET | Freq: Four times a day (QID) | ORAL | Status: DC | PRN
  Administered 2016-08-04 – 2016-08-05 (×3): 650 mg via ORAL
  Filled 2016-08-03 (×3): qty 2

## 2016-08-03 MED ORDER — NITROGLYCERIN 0.4 MG SL SUBL
0.4000 mg | SUBLINGUAL_TABLET | SUBLINGUAL | Status: DC | PRN
  Administered 2016-08-03 (×3): 0.4 mg via SUBLINGUAL
  Filled 2016-08-03 (×3): qty 1

## 2016-08-03 MED ORDER — INSULIN ASPART 100 UNIT/ML ~~LOC~~ SOLN
0.0000 [IU] | Freq: Three times a day (TID) | SUBCUTANEOUS | Status: DC
  Administered 2016-08-04: 1 [IU] via SUBCUTANEOUS
  Administered 2016-08-05: 2 [IU] via SUBCUTANEOUS
  Administered 2016-08-05: 1 [IU] via SUBCUTANEOUS
  Filled 2016-08-03: qty 1
  Filled 2016-08-03 (×2): qty 2

## 2016-08-03 MED ORDER — PANTOPRAZOLE SODIUM 40 MG PO TBEC
40.0000 mg | DELAYED_RELEASE_TABLET | Freq: Every day | ORAL | Status: DC
  Administered 2016-08-04 – 2016-08-06 (×3): 40 mg via ORAL
  Filled 2016-08-03 (×3): qty 1

## 2016-08-03 MED ORDER — ONDANSETRON HCL 4 MG/2ML IJ SOLN
4.0000 mg | Freq: Four times a day (QID) | INTRAMUSCULAR | Status: DC | PRN
  Filled 2016-08-03: qty 2

## 2016-08-03 MED ORDER — ACETAMINOPHEN 650 MG RE SUPP: 650.0000 mg | Freq: Four times a day (QID) | RECTAL | Status: DC | PRN

## 2016-08-03 MED ORDER — ROSUVASTATIN CALCIUM 20 MG PO TABS
40.0000 mg | ORAL_TABLET | Freq: Every day | ORAL | Status: DC
  Administered 2016-08-04 – 2016-08-05 (×2): 40 mg via ORAL
  Filled 2016-08-03: qty 2
  Filled 2016-08-03: qty 1
  Filled 2016-08-03: qty 2

## 2016-08-03 MED ORDER — INSULIN ASPART 100 UNIT/ML ~~LOC~~ SOLN
0.0000 [IU] | Freq: Every day | SUBCUTANEOUS | Status: DC
  Administered 2016-08-04: 2 [IU] via SUBCUTANEOUS
  Filled 2016-08-03: qty 2

## 2016-08-03 MED ORDER — PROCHLORPERAZINE EDISYLATE 5 MG/ML IJ SOLN
10.0000 mg | Freq: Once | INTRAMUSCULAR | Status: AC
  Administered 2016-08-03: 10 mg via INTRAVENOUS
  Filled 2016-08-03: qty 2

## 2016-08-03 MED ORDER — PROCHLORPERAZINE EDISYLATE 5 MG/ML IJ SOLN: 5.0000 mg | INTRAMUSCULAR | Status: DC | PRN

## 2016-08-03 MED ORDER — TRAMADOL HCL 50 MG PO TABS
50.0000 mg | ORAL_TABLET | Freq: Four times a day (QID) | ORAL | Status: DC | PRN
  Administered 2016-08-03 – 2016-08-06 (×7): 50 mg via ORAL
  Filled 2016-08-03 (×7): qty 1

## 2016-08-03 MED ORDER — DIPHENHYDRAMINE HCL 50 MG/ML IJ SOLN
25.0000 mg | Freq: Once | INTRAMUSCULAR | Status: AC
  Administered 2016-08-03: 25 mg via INTRAVENOUS
  Filled 2016-08-03: qty 1

## 2016-08-03 MED ORDER — ONDANSETRON HCL 4 MG PO TABS: 4.0000 mg | ORAL_TABLET | Freq: Four times a day (QID) | ORAL | Status: DC | PRN

## 2016-08-03 MED ORDER — METOPROLOL TARTRATE 50 MG PO TABS
50.0000 mg | ORAL_TABLET | Freq: Two times a day (BID) | ORAL | Status: DC
  Administered 2016-08-03 – 2016-08-06 (×6): 50 mg via ORAL
  Filled 2016-08-03 (×6): qty 1

## 2016-08-03 NOTE — ED Notes (Signed)
Patient transported to X-ray 

## 2016-08-03 NOTE — ED Notes (Signed)
FIRST NURSE: pt sent over from [facility] for further eval of ongoing chest pain and abnormal ecg.

## 2016-08-03 NOTE — ED Triage Notes (Signed)
Pt sent from [facility] after EKG changes since last week. Pt has been reporting chest pain throughout the week. Pt reports pain is in left side of chest with SOB, diaphoresis and and nausea. Pt alert and oriented and able to speak in complete sentences. Pt in NAd at this time

## 2016-08-03 NOTE — H&P (Signed)
[facility] NAME: Candace [name]    MR#:  [id number]  DATE OF BIRTH:  06-Sep-1950  DATE OF ADMISSION:  08/03/2016  PRIMARY CARE PHYSICIAN: [name]., MD   REQUESTING/REFERRING PHYSICIAN: Mable Paris, MD  CHIEF COMPLAINT:   Chief Complaint  Patient presents with  . Chest Pain    HISTORY OF PRESENT ILLNESS:  Candace [name]  is a 66 y.o. female who presents with Persistent and recurrent chest pain. Patient was recently admitted for the same. She was in cardiology clinic for follow-up today, and Combivent and her chest pain. EKG there showed some slight new changes. She was sent here for further evaluation. Hospitals were called for admission.  PAST MEDICAL HISTORY:   Past Medical History:  Diagnosis Date  . Allergy   . Anemia   . Anxiety   . Arrhythmia   . Arthritis   . Coronary artery disease   . Depression   . Diabetes mellitus without complication ([place])   . Dyspnea    doe  . Dysrhythmia   . GERD (gastroesophageal reflux disease)   . Headache   . Hyperlipidemia   . Hypertension   . Myocardial infarction ([place])    2016  . Myocardial infarction with cardiac rehabilitation [facility])    MI 2016/ CABG 8/17    FINISHED CARDIAC REHAB 3 WEEKS AGO  . Panic attack   . Reflux   . Stroke ([place])   . Voice tremor     PAST SURGICAL HISTORY:   Past Surgical History:  Procedure Laterality Date  . APPENDECTOMY    . ARTERY BIOPSY Right 04/26/2016   Procedure: BIOPSY TEMPORAL ARTERY;  Surgeon: Margaretha Sheffield, MD;  Location: [facility] ORS;  Service: ENT;  Laterality: Right;  . CARDIAC CATHETERIZATION N/A 11/06/2015   Procedure: Left Heart Cath and Coronary Angiography;  Surgeon: Corey Skains, MD;  Location: [place] CV LAB;  Service: Cardiovascular;  Laterality: N/A;  . CESAREAN SECTION    . CORONARY ARTERY BYPASS GRAFT N/A 11/10/2015   Procedure: CORONARY ARTERY BYPASS GRAFTING (CABG), ON PUMP, TIMES FOUR, USING LEFT  INTERNAL MAMMARY ARTERY, BILATERAL GREATER SAPHENOUS VEINS HARVESTED ENDOSCOPICALLY;  Surgeon: [name], MD;  Location: [place];  Service: Open Heart Surgery;  Laterality: N/A;  LIMA-LAD; SEQ SVG-OM1-OM2; SVG-PL  . TEE WITHOUT CARDIOVERSION N/A 11/10/2015   Procedure: TRANSESOPHAGEAL ECHOCARDIOGRAM (TEE);  Surgeon: [name], MD;  Location: [place];  Service: Open Heart Surgery;  Laterality: N/A;  . TUBAL LIGATION      SOCIAL HISTORY:   Social History  Substance Use Topics  . Smoking status: Former Smoker    Packs/day: 0.50    Types: Cigarettes    Quit date: 10/07/2001  . Smokeless tobacco: Never Used  . Alcohol use No    FAMILY HISTORY:   Family History  Problem Relation Age of Onset  . Cancer Father   . Hypertension Father   . Heart disease Father   . Cancer Mother   . Hypertension Mother   . Cancer Sister   . Breast cancer Sister 62  . Breast cancer Sister 68    DRUG ALLERGIES:   Allergies  Allergen Reactions  . Penicillins Swelling, Rash and Other (See Comments)    Has patient had a PCN reaction causing immediate rash, facial/tongue/throat swelling, SOB or lightheadedness with hypotension: Yes Has patient had a PCN reaction causing severe rash involving mucus membranes or skin necrosis: Yes Has patient had  a PCN reaction that required hospitalization No Has patient had a PCN reaction occurring within the last 10 years: Yes If all of the above answers are "NO", then may proceed with Cephalosporin use.     MEDICATIONS AT HOME:   Prior to Admission medications   Medication Sig Start Date End Date Taking? Authorizing Provider  acetaminophen (TYLENOL) 500 MG tablet Take 1,000 mg by mouth every 6 (six) hours as needed.   Yes [provider]  alendronate (FOSAMAX) 70 MG tablet Take 70 mg by mouth once a week. Take with a full glass of water on an empty stomach.   Yes [provider]  apixaban (ELIQUIS) 5 MG TABS tablet Take 5 mg by mouth 2  (two) times daily.    Yes [provider]  calcium carbonate (OS-CAL - DOSED IN MG OF ELEMENTAL CALCIUM) 1250 (500 Ca) MG tablet Take 1 tablet by mouth daily with breakfast.   Yes [provider]  cetirizine (ZYRTEC) 10 MG tablet Take 10 mg by mouth daily.   Yes [provider]  docusate sodium (COLACE) 100 MG capsule Take 100 mg by mouth daily as needed for mild constipation.   Yes [provider]  insulin glargine (LANTUS) 100 UNIT/ML injection Inject 8 Units into the skin daily.    Yes [provider]  metFORMIN (GLUCOPHAGE-XR) 750 MG 24 hr tablet Take 1 tablet (750 mg total) by mouth 2 (two) times daily. 11/18/15  Yes Gold, Wayne E, PA-C  metoprolol (LOPRESSOR) 50 MG tablet Take 50 mg by mouth 2 (two) times daily.    Yes [provider]  nitroGLYCERIN (NITROSTAT) 0.4 MG SL tablet Place 1 tablet (0.4 mg total) under the tongue every 5 (five) minutes as needed for chest pain. 07/27/16  Yes Bettey Costa, MD  ondansetron (ZOFRAN ODT) 8 MG disintegrating tablet Take 1 tablet (8 mg total) by mouth every 8 (eight) hours as needed for nausea or vomiting. 06/22/16  Yes Tomi Likens, Adam R, DO  Oxcarbazepine (TRILEPTAL) 300 MG tablet Take 1 tablet (300 mg total) by mouth 2 (two) times daily. 07/28/16  Yes [name]., MD  pantoprazole (PROTONIX) 40 MG tablet Take 40 mg by mouth daily.   Yes [provider]  rosuvastatin (CRESTOR) 40 MG tablet Take 1 tablet (40 mg total) by mouth daily at 6 PM. 11/18/15  Yes Gold, Wayne E, PA-C  traMADol (ULTRAM) 50 MG tablet Take 50 mg by mouth every 6 (six) hours as needed.   Yes [provider]  vitamin B-12 (CYANOCOBALAMIN) 1000 MCG tablet Take 1,000 mcg by mouth daily.   Yes [provider]    REVIEW OF SYSTEMS:  Review of Systems  Constitutional: Negative for chills, fever, malaise/fatigue and weight loss.  HENT: Negative for ear pain, hearing loss and tinnitus.   Eyes: Negative for  blurred vision, double vision, pain and redness.  Respiratory: Negative for cough, hemoptysis and shortness of breath.   Cardiovascular: Positive for chest pain. Negative for palpitations, orthopnea and leg swelling.  Gastrointestinal: Negative for abdominal pain, constipation, diarrhea, nausea and vomiting.  Genitourinary: Negative for dysuria, frequency and hematuria.  Musculoskeletal: Negative for back pain, joint pain and neck pain.  Skin:       No acne, rash, or lesions  Neurological: Negative for dizziness, tremors, focal weakness and weakness.  Endo/Heme/Allergies: Negative for polydipsia. Does not bruise/bleed easily.  Psychiatric/Behavioral: Negative for depression. The patient is not nervous/anxious and does not have insomnia.  VITAL SIGNS:   Vitals:   08/03/16 1629 08/03/16 1630 08/03/16 2037  BP: 110/88  140/71  Pulse: (!) 106  96  Resp: 16  16  Temp: 98.8 F (37.1 C)    TempSrc: Oral    SpO2: 96%  98%  Weight:  69.9 kg (154 lb)   Height:  5\' 3"  (1.6 m)    Wt Readings from Last 3 Encounters:  08/03/16 69.9 kg (154 lb)  07/28/16 69.9 kg (154 lb)  07/26/16 69.9 kg (154 lb)    PHYSICAL EXAMINATION:  Physical Exam  Vitals reviewed. Constitutional: She is oriented to person, place, and time. She appears well-developed and well-nourished. No distress.  HENT:  Head: Normocephalic and atraumatic.  Mouth/Throat: Oropharynx is clear and moist.  Eyes: Conjunctivae and EOM are normal. Pupils are equal, round, and reactive to light. No scleral icterus.  Neck: Normal range of motion. Neck supple. No JVD present. No thyromegaly present.  Cardiovascular: Normal rate, regular rhythm and intact distal pulses.  Exam reveals no gallop and no friction rub.   No murmur heard. Respiratory: Effort normal and breath sounds normal. No respiratory distress. She has no wheezes. She has no rales.  GI: Soft. Bowel sounds are normal. She exhibits no distension. There is no tenderness.   Musculoskeletal: Normal range of motion. She exhibits no edema.  No arthritis, no gout  Lymphadenopathy:    She has no cervical adenopathy.  Neurological: She is alert and oriented to person, place, and time. No cranial nerve deficit.  No dysarthria, no aphasia  Skin: Skin is warm and dry. No rash noted. No erythema.  Psychiatric: She has a normal mood and affect. Her behavior is normal. Judgment and thought content normal.    LABORATORY PANEL:   CBC  Recent Labs Lab 08/03/16 1641  WBC 9.8  HGB 11.7*  HCT 34.9*  PLT 329   ------------------------------------------------------------------------------------------------------------------  Chemistries   Recent Labs Lab 08/03/16 1641  NA 139  K 3.6  CL 103  CO2 26  GLUCOSE 172*  BUN 15  CREATININE 0.48  CALCIUM 9.6   ------------------------------------------------------------------------------------------------------------------  Cardiac Enzymes  Recent Labs Lab 08/03/16 2054  TROPONINI <0.03   ------------------------------------------------------------------------------------------------------------------  RADIOLOGY:  Dg Chest 2 View  Result Date: 08/03/2016 CLINICAL DATA:  Left chest pain and shortness breath. Diaphoresis and nausea. EXAM: CHEST  2 VIEW COMPARISON:  07/26/2016. FINDINGS: Normal sized heart. Clear lungs. Post CABG changes. Diffuse osteopenia. Left shoulder degenerative changes and probable loose bodies. IMPRESSION: No acute abnormality. Electronically Signed   By: Claudie Revering M.D.   On: 08/03/2016 16:56    EKG:   Orders placed or performed during the hospital encounter of 08/03/16  . ED EKG within 10 minutes  . ED EKG within 10 minutes  . Repeat EKG  . Repeat EKG    IMPRESSION AND PLAN:  Principal Problem:   Unstable angina (HCC) - trending cardiac enzymes, get an echocardiogram and cardiology consult in the morning Active Problems:   Arteriosclerosis of coronary artery - continue  home meds, other workup as above   Benign essential HTN - continue home meds   Diabetes ([place]) - sliding scale insulin with corresponding glucose checks   Acid reflux - home dose PPI  All the records are reviewed and case discussed with ED provider. Management plans discussed with the patient and/or family.  DVT PROPHYLAXIS: Systemic anticoagulation  GI PROPHYLAXIS: PPI  ADMISSION STATUS: Observation  CODE STATUS: Full Code Status History    Date Active  Date Inactive Code Status Order ID Comments User Context   07/26/2016  4:39 PM 07/27/2016  6:11 PM Full Code [id number]  Demetrios Loll, MD Inpatient   11/07/2015  5:11 PM 11/10/2015  3:52 PM Full Code [id number]  John Giovanni, PA-C Inpatient   11/05/2015  9:55 PM 11/07/2015  4:46 PM Full Code [id number]  Henreitta Leber, MD Inpatient    Advance Directive Documentation     Most Recent Value  Type of Advance Directive  Healthcare Power of Attorney  Pre-existing out of facility DNR order (yellow form or pink MOST form)  -  "MOST" Form in Place?  -      TOTAL TIME TAKING CARE OF THIS PATIENT: 40 minutes.   Eliot Popper [place] 08/03/2016, 9:59 PM  [facility]  Office  [phone]  CC: Primary care physician; [name]., MD  Note:  This document was prepared using Dragon voice recognition software and may include unintentional dictation errors.

## 2016-08-03 NOTE — ED Provider Notes (Signed)
[facility] Emergency Department Provider Note  ____________________________________________   First MD Initiated Contact with Patient 08/03/16 2125     (approximate)  I have reviewed the triage vital signs and the nursing notes.   HISTORY  Chief Complaint Chest Pain    HPI Candace [name] is a 66 y.o. female who sent to the emergency department from [facility] Cardiology clinic for inpatient admission secondary to abnormal EKG. The patient has a significant cardiac history having had a coronary artery bypass graft in the past and she was in clinic today for routine follow-up. She has always had stable angina but she reports rest pain recently. The pain is moderate severity aching and pressure-like chest pain in her left chest associated with mild shortness of breath. In clinic today she was found to have new lateral Q waves and was advised to come to the emergency department. She says she cannot take aspirin because she is on Eliquis.   Past Medical History:  Diagnosis Date  . Allergy   . Anemia   . Anxiety   . Arrhythmia   . Arthritis   . Coronary artery disease   . Depression   . Diabetes mellitus without complication ([place])   . Dyspnea    doe  . Dysrhythmia   . GERD (gastroesophageal reflux disease)   . Headache   . Hyperlipidemia   . Hypertension   . Myocardial infarction ([place])    2016  . Myocardial infarction with cardiac rehabilitation [facility])    MI 2016/ CABG 8/17    FINISHED CARDIAC REHAB 3 WEEKS AGO  . Panic attack   . Reflux   . Stroke ([place])   . Voice tremor     Patient Active Problem List   Diagnosis Date Noted  . Abnormal stress test 08/05/2016  . Unstable angina ([place]) 08/03/2016  . Diabetes ([place]) 08/03/2016  . Snoring 02/13/2016  . Bradycardia 12/31/2015  . Asthma 11/11/2015  . S/P CABG x 4 11/10/2015  . Coronary artery disease 11/07/2015  . Benign essential HTN 10/28/2014  . Episode of syncope 10/25/2014  . Carotid  artery narrowing 02/08/2014  . Chest pain 02/08/2014  . Mixed hyperlipidemia 02/08/2014  . Atypical chest pain 02/07/2014  . Arteriosclerosis of coronary artery 02/07/2014  . Essential (primary) hypertension 02/07/2014  . Awareness of heartbeats 02/07/2014  . Abdominal pain 10/05/2013  . D (diarrhea) 10/05/2013  . Acid reflux 10/05/2013    Past Surgical History:  Procedure Laterality Date  . APPENDECTOMY    . ARTERY BIOPSY Right 04/26/2016   Procedure: BIOPSY TEMPORAL ARTERY;  Surgeon: Margaretha Sheffield, MD;  Location: [facility] ORS;  Service: ENT;  Laterality: Right;  . CARDIAC CATHETERIZATION N/A 11/06/2015   Procedure: Left Heart Cath and Coronary Angiography;  Surgeon: Corey Skains, MD;  Location: [place] CV LAB;  Service: Cardiovascular;  Laterality: N/A;  . CESAREAN SECTION    . CORONARY ARTERY BYPASS GRAFT N/A 11/10/2015   Procedure: CORONARY ARTERY BYPASS GRAFTING (CABG), ON PUMP, TIMES FOUR, USING LEFT INTERNAL MAMMARY ARTERY, BILATERAL GREATER SAPHENOUS VEINS HARVESTED ENDOSCOPICALLY;  Surgeon: [name], MD;  Location: [place];  Service: Open Heart Surgery;  Laterality: N/A;  LIMA-LAD; SEQ SVG-OM1-OM2; SVG-PL  . CORONARY STENT INTERVENTION N/A 08/05/2016   Procedure: Coronary Stent Intervention;  Surgeon: Isaias Cowman, MD;  Location: [place] CV LAB;  Service: Cardiovascular;  Laterality: N/A;  . LEFT HEART CATH AND CORONARY ANGIOGRAPHY N/A 08/05/2016   Procedure: Left Heart Cath and Coronary Angiography;  Surgeon: Isaias Cowman, MD;  Location: [place] CV LAB;  Service: Cardiovascular;  Laterality: N/A;  . TEE WITHOUT CARDIOVERSION N/A 11/10/2015   Procedure: TRANSESOPHAGEAL ECHOCARDIOGRAM (TEE);  Surgeon: [name], MD;  Location: [place];  Service: Open Heart Surgery;  Laterality: N/A;  . TUBAL LIGATION      Prior to Admission medications   Medication Sig Start Date End Date Taking? Authorizing Provider  acetaminophen (TYLENOL) 500 MG  tablet Take 1,000 mg by mouth every 6 (six) hours as needed.   Yes [provider]  alendronate (FOSAMAX) 70 MG tablet Take 70 mg by mouth once a week. Take with a full glass of water on an empty stomach.   Yes [provider]  calcium carbonate (OS-CAL - DOSED IN MG OF ELEMENTAL CALCIUM) 1250 (500 Ca) MG tablet Take 1 tablet by mouth daily with breakfast.   Yes [provider]  cetirizine (ZYRTEC) 10 MG tablet Take 10 mg by mouth daily.   Yes [provider]  docusate sodium (COLACE) 100 MG capsule Take 100 mg by mouth daily as needed for mild constipation.   Yes [provider]  insulin glargine (LANTUS) 100 UNIT/ML injection Inject 8 Units into the skin daily.    Yes [provider]  metFORMIN (GLUCOPHAGE-XR) 750 MG 24 hr tablet Take 1 tablet (750 mg total) by mouth 2 (two) times daily. 11/18/15  Yes Gold, Wayne E, PA-C  metoprolol (LOPRESSOR) 50 MG tablet Take 50 mg by mouth 2 (two) times daily.    Yes [provider]  nitroGLYCERIN (NITROSTAT) 0.4 MG SL tablet Place 1 tablet (0.4 mg total) under the tongue every 5 (five) minutes as needed for chest pain. 07/27/16  Yes Bettey Costa, MD  ondansetron (ZOFRAN ODT) 8 MG disintegrating tablet Take 1 tablet (8 mg total) by mouth every 8 (eight) hours as needed for nausea or vomiting. 06/22/16  Yes Tomi Likens, Adam R, DO  Oxcarbazepine (TRILEPTAL) 300 MG tablet Take 1 tablet (300 mg total) by mouth 2 (two) times daily. 07/28/16  Yes [name]., MD  pantoprazole (PROTONIX) 40 MG tablet Take 40 mg by mouth daily.   Yes [provider]  rosuvastatin (CRESTOR) 40 MG tablet Take 1 tablet (40 mg total) by mouth daily at 6 PM. 11/18/15  Yes Gold, Wayne E, PA-C  traMADol (ULTRAM) 50 MG tablet Take 50 mg by mouth every 6 (six) hours as needed.   Yes [provider]  vitamin B-12 (CYANOCOBALAMIN) 1000 MCG tablet Take 1,000 mcg by mouth daily.   Yes [provider]  aspirin 81  MG chewable tablet Chew 1 tablet (81 mg total) by mouth daily. 08/06/16   Fritzi Mandes, MD  clopidogrel (PLAVIX) 75 MG tablet Take 1 tablet (75 mg total) by mouth daily with breakfast. 08/06/16   Fritzi Mandes, MD  isosorbide mononitrate (IMDUR) 30 MG 24 hr tablet Take 1 tablet (30 mg total) by mouth daily. 08/06/16   Fritzi Mandes, MD    Allergies Penicillins  Family History  Problem Relation Age of Onset  . Cancer Father   . Hypertension Father   . Heart disease Father   . Cancer Mother   . Hypertension Mother   . Cancer Sister   . Breast cancer Sister 49  . Breast cancer Sister 16    Social History Social History  Substance Use Topics  . Smoking status: Former Smoker    Packs/day: 0.50    Types: Cigarettes    Quit  date: 10/07/2001  . Smokeless tobacco: Never Used  . Alcohol use No    Review of Systems Constitutional: No fever/chills Eyes: No visual changes. ENT: No sore throat. Cardiovascular: Positive chest pain. Respiratory: Positive shortness of breath. Gastrointestinal: No abdominal pain.  No nausea, no vomiting.  No diarrhea.  No constipation. Genitourinary: Negative for dysuria. Musculoskeletal: Negative for back pain. Skin: Negative for rash. Neurological: Negative for headaches, focal weakness or numbness.  10-point ROS otherwise negative.  ____________________________________________   PHYSICAL EXAM:  VITAL SIGNS: ED Triage Vitals  Enc Vitals Group     BP 08/03/16 1629 110/88     Pulse Rate 08/03/16 1629 (!) 106     Resp 08/03/16 1629 16     Temp 08/03/16 1629 98.8 F (37.1 C)     Temp Source 08/03/16 1629 Oral     SpO2 08/03/16 1629 96 %     Weight 08/03/16 1630 154 lb (69.9 kg)     Height 08/03/16 1630 5\' 3"  (1.6 m)     Head Circumference --      Peak Flow --      Pain Score 08/03/16 1638 7     Pain Loc --      Pain Edu? --      Excl. in [place]? --     Constitutional: Alert and oriented x 4 well appearing nontoxic no diaphoresis speaks in full,  clear sentences Eyes: PERRL EOMI. Head: Atraumatic. Nose: No congestion/rhinnorhea. Mouth/Throat: No trismus Neck: No stridor.   Cardiovascular: Normal rate, regular rhythm. Grossly normal heart sounds.  Good peripheral circulation. Respiratory: Normal respiratory effort.  No retractions. Lungs CTAB and moving good air Gastrointestinal: Soft nondistended nontender Musculoskeletal: No lower extremity edema   Neurologic:  Normal speech and language. No gross focal neurologic deficits are appreciated. Skin:  Skin is warm, dry and intact. No rash noted. Psychiatric: Mood and affect are normal. Speech and behavior are normal.    ____________________________________________    ____________________________________________   LABS (all labs ordered are listed, but only abnormal results are displayed)  Labs Reviewed  BASIC METABOLIC PANEL - Abnormal; Notable for the following:       Result Value   Glucose, Bld 172 (*)    All other components within normal limits  CBC - Abnormal; Notable for the following:    RBC 3.62 (*)    Hemoglobin 11.7 (*)    HCT 34.9 (*)    RDW 15.4 (*)    All other components within normal limits  GLUCOSE, CAPILLARY - Abnormal; Notable for the following:    Glucose-Capillary 120 (*)    All other components within normal limits  GLUCOSE, CAPILLARY - Abnormal; Notable for the following:    Glucose-Capillary 118 (*)    All other components within normal limits  GLUCOSE, CAPILLARY - Abnormal; Notable for the following:    Glucose-Capillary 126 (*)    All other components within normal limits  GLUCOSE, CAPILLARY - Abnormal; Notable for the following:    Glucose-Capillary 148 (*)    All other components within normal limits  GLUCOSE, CAPILLARY - Abnormal; Notable for the following:    Glucose-Capillary 220 (*)    All other components within normal limits  GLUCOSE, CAPILLARY - Abnormal; Notable for the following:    Glucose-Capillary 162 (*)    All other  components within normal limits  GLUCOSE, CAPILLARY - Abnormal; Notable for the following:    Glucose-Capillary 109 (*)    All other components within normal limits  GLUCOSE, CAPILLARY - Abnormal; Notable for the following:    Glucose-Capillary 159 (*)    All other components within normal limits  BASIC METABOLIC PANEL - Abnormal; Notable for the following:    Glucose, Bld 128 (*)    Creatinine, Ser 0.40 (*)    Calcium 8.8 (*)    All other components within normal limits  CBC - Abnormal; Notable for the following:    RBC 3.56 (*)    Hemoglobin 11.5 (*)    HCT 34.7 (*)    RDW 15.3 (*)    All other components within normal limits  GLUCOSE, CAPILLARY - Abnormal; Notable for the following:    Glucose-Capillary 169 (*)    All other components within normal limits  GLUCOSE, CAPILLARY - Abnormal; Notable for the following:    Glucose-Capillary 148 (*)    All other components within normal limits  TROPONIN I  TROPONIN I  TROPONIN I  TROPONIN I  POCT ACTIVATED CLOTTING TIME    No signs of acute ischemia __________________________________________  EKG  ED ECG REPORT I, Darel Hong, the attending physician, personally viewed and interpreted this ECG.  Date: 08/06/2016 Rate: 86 Rhythm: normal sinus rhythm QRS Axis: normal Intervals: normal ST/T Wave abnormalities: Nonspecific ST changes Conduction Disturbances: none Narrative Interpretation: Borderline  ____________________________________________  RADIOLOGY  No signs of acute disease   PROCEDURES  Procedure(s) performed: no  Procedures  Critical Care performed: no  Observation: no ____________________________________________   INITIAL IMPRESSION / ASSESSMENT AND PLAN / ED COURSE  Pertinent labs & imaging results that were available during my care of the patient were reviewed by me and considered in my medical decision making (see chart for details).  By the time I saw the patient she had waited 4 hours  and had a first troponin which was negative. Her EKG here today is nonischemic however on review she was sent from cardiology clinic by Dr. Ubaldo Glassing for new lateral Q waves. Her history is concerning for rest angina instead of her previous stable angina. At this point she'll require inpatient admission for serial troponins.    The patient was sent to the emergency department by cardiologist Dr. Satira Mccallum for serial troponins and inpatient admission and she is now having rest pain and new EKG changes.  ____________________________________________   FINAL CLINICAL IMPRESSION(S) / ED DIAGNOSES  Final diagnoses:  Unstable angina (HCC)      NEW MEDICATIONS STARTED DURING THIS VISIT:  Discharge Medication List as of 08/06/2016  9:15 AM    START taking these medications   Details  aspirin 81 MG chewable tablet Chew 1 tablet (81 mg total) by mouth daily., Starting Fri 08/06/2016, Normal    clopidogrel (PLAVIX) 75 MG tablet Take 1 tablet (75 mg total) by mouth daily with breakfast., Starting Fri 08/06/2016, Normal    isosorbide mononitrate (IMDUR) 30 MG 24 hr tablet Take 1 tablet (30 mg total) by mouth daily., Starting Fri 08/06/2016, Normal         Note:  This document was prepared using Dragon voice recognition software and may include unintentional dictation errors.     Darel Hong, MD 08/06/16 2258

## 2016-08-04 ENCOUNTER — Observation Stay: Payer: Medicare Other

## 2016-08-04 ENCOUNTER — Observation Stay
Admit: 2016-08-04 | Discharge: 2016-08-04 | Disposition: A | Discharge status: Home / Self Care | Payer: Medicare Other | Attending: Internal Medicine | Admitting: Internal Medicine

## 2016-08-04 ENCOUNTER — Ambulatory Visit: Payer: Medicare HMO

## 2016-08-04 ENCOUNTER — Telehealth: Payer: Self-pay | Admitting: Family Medicine

## 2016-08-04 LAB — NM MYOCAR MULTI W/SPECT W/WALL MOTION / EF
Estimated workload: 1 METS
Exercise duration (min): 1 min
Exercise duration (sec): 0 s
LV dias vol: 36 mL (ref 46–106)
LV sys vol: 14 mL
MPHR: 155 {beats}/min
Peak HR: 129 {beats}/min
Percent HR: 83 %
Rest HR: 96 {beats}/min
SDS: 0
SRS: 2
SSS: 0
TID: 1.12

## 2016-08-04 LAB — GLUCOSE, CAPILLARY
Glucose-Capillary: 120 mg/dL — ABNORMAL HIGH (ref 65–99)
Glucose-Capillary: 118 mg/dL — ABNORMAL HIGH (ref 65–99)
Glucose-Capillary: 126 mg/dL — ABNORMAL HIGH (ref 65–99)
Glucose-Capillary: 148 mg/dL — ABNORMAL HIGH (ref 65–99)
Glucose-Capillary: 220 mg/dL — ABNORMAL HIGH (ref 65–99)

## 2016-08-04 LAB — TROPONIN I
Troponin I: <0.03 ng/mL (ref <0.03)
Troponin I: <0.03 ng/mL (ref <0.03)

## 2016-08-04 LAB — ECHOCARDIOGRAM COMPLETE
Height: 63 in
Weight: 2472 oz

## 2016-08-04 MED ORDER — AMINOPHYLLINE 25 MG/ML IV (NUC MED)
75.0000 mg | Freq: Once | INTRAVENOUS | Status: AC
  Administered 2016-08-04: 75 mg via INTRAVENOUS

## 2016-08-04 MED ORDER — ISOSORBIDE MONONITRATE ER 30 MG PO TB24
30.0000 mg | ORAL_TABLET | Freq: Every day | ORAL | Status: DC
  Administered 2016-08-04 – 2016-08-06 (×3): 30 mg via ORAL
  Filled 2016-08-04 (×3): qty 1

## 2016-08-04 MED ORDER — TECHNETIUM TC 99M TETROFOSMIN IV KIT
29.7700 | PACK | Freq: Once | INTRAVENOUS | Status: AC | PRN
  Administered 2016-08-04: 29.77 via INTRAVENOUS

## 2016-08-04 MED ORDER — TECHNETIUM TC 99M TETROFOSMIN IV KIT
12.8850 | PACK | Freq: Once | INTRAVENOUS | Status: AC | PRN
  Administered 2016-08-04: 12.885 via INTRAVENOUS

## 2016-08-04 MED ORDER — REGADENOSON 0.4 MG/5ML IV SOLN
0.4000 mg | Freq: Once | INTRAVENOUS | Status: AC
  Administered 2016-08-04: 0.4 mg via INTRAVENOUS

## 2016-08-04 NOTE — Telephone Encounter (Signed)
Pt is being discharged from [facility] today for chest pain.  I have scheduled a hospital follow up/MW

## 2016-08-04 NOTE — Progress Notes (Signed)
[facility] [place] NAME: Candace [name]    MR#:  [id number]  DATE OF BIRTH:  1951-02-02  SUBJECTIVE:  CHIEF COMPLAINT:   Chief Complaint  Patient presents with  . Chest Pain   Chest pain on and off  REVIEW OF SYSTEMS:    Review of Systems  Constitutional: Positive for malaise/fatigue. Negative for chills and fever.  HENT: Negative for sore throat.   Eyes: Negative for blurred vision, double vision and pain.  Respiratory: Negative for cough, hemoptysis, shortness of breath and wheezing.   Cardiovascular: Positive for chest pain. Negative for palpitations, orthopnea and leg swelling.  Gastrointestinal: Negative for abdominal pain, constipation, diarrhea, heartburn, nausea and vomiting.  Genitourinary: Negative for dysuria and hematuria.  Musculoskeletal: Negative for back pain and joint pain.  Skin: Negative for rash.  Neurological: Positive for weakness. Negative for sensory change, speech change, focal weakness and headaches.  Endo/Heme/Allergies: Does not bruise/bleed easily.  Psychiatric/Behavioral: Negative for depression. The patient is not nervous/anxious.     DRUG ALLERGIES:   Allergies  Allergen Reactions  . Penicillins Swelling, Rash and Other (See Comments)    Has patient had a PCN reaction causing immediate rash, facial/tongue/throat swelling, SOB or lightheadedness with hypotension: Yes Has patient had a PCN reaction causing severe rash involving mucus membranes or skin necrosis: Yes Has patient had a PCN reaction that required hospitalization No Has patient had a PCN reaction occurring within the last 10 years: Yes If all of the above answers are "NO", then may proceed with Cephalosporin use.     VITALS:  Blood pressure 138/71, pulse 86, temperature 98.3 F (36.8 C), temperature source Oral, resp. rate 18, height 5\' 3"  (1.6 m), weight 70.1 kg (154 lb 8 oz), SpO2 93 %.  PHYSICAL EXAMINATION:   Physical  Exam  GENERAL:  66 y.o.-year-old patient lying in the bed with no acute distress.  EYES: Pupils equal, round, reactive to light and accommodation. No scleral icterus. Extraocular muscles intact.  HEENT: Head atraumatic, normocephalic. Oropharynx and nasopharynx clear.  NECK:  Supple, no jugular venous distention. No thyroid enlargement, no tenderness.  LUNGS: Normal breath sounds bilaterally, no wheezing, rales, rhonchi. No use of accessory muscles of respiration.  CARDIOVASCULAR: S1, S2 normal. No murmurs, rubs, or gallops.  ABDOMEN: Soft, nontender, nondistended. Bowel sounds present. No organomegaly or mass.  EXTREMITIES: No cyanosis, clubbing or edema b/l.    NEUROLOGIC: Cranial nerves II through XII are intact. No focal Motor or sensory deficits b/l.   PSYCHIATRIC: The patient is alert and oriented x 3.  SKIN: No obvious rash, lesion, or ulcer.   LABORATORY PANEL:   CBC  Recent Labs Lab 08/03/16 1641  WBC 9.8  HGB 11.7*  HCT 34.9*  PLT 329   ------------------------------------------------------------------------------------------------------------------ Chemistries   Recent Labs Lab 08/03/16 1641  NA 139  K 3.6  CL 103  CO2 26  GLUCOSE 172*  BUN 15  CREATININE 0.48  CALCIUM 9.6   ------------------------------------------------------------------------------------------------------------------  Cardiac Enzymes  Recent Labs Lab 08/04/16 0835  TROPONINI <0.03   ------------------------------------------------------------------------------------------------------------------  RADIOLOGY:  Dg Chest 2 View  Result Date: 08/03/2016 CLINICAL DATA:  Left chest pain and shortness breath. Diaphoresis and nausea. EXAM: CHEST  2 VIEW COMPARISON:  07/26/2016. FINDINGS: Normal sized heart. Clear lungs. Post CABG changes. Diffuse osteopenia. Left shoulder degenerative changes and probable loose bodies. IMPRESSION: No acute abnormality. Electronically Signed   By: Claudie Revering M.D.   On: 08/03/2016 16:56  Nm Myocar Multi W/spect W/wall Motion / Ef  Result Date: 08/04/2016  Blood pressure demonstrated a normal response to exercise.  There was no ST segment deviation noted during stress.  Defect 1: There is a defect present in the apex location.  This is a low risk study.  The left ventricular ejection fraction is mildly decreased (45-54%).  Findings consistent with ischemia.      ASSESSMENT AND PLAN:   * Chest pain, atypical. Exertional and non exertional. Troponin x 3 normal. EKG- Nothing new. Stress test with apical reversible ischemia, mild. Discussed with Dr. Stephani Police.  Start Imdur. Hold eliquis. If chest pain improved will continue medical management or will need cath tomorrow. ASA, Statin, BB  * Afib Controlled Hold eliuis for possible cath  * HTN Well controlled  All the records are reviewed and case discussed with Care Management/Social Workerr. Management plans discussed with the patient, family and they are in agreement.  CODE STATUS: FULL CODE  DVT Prophylaxis: SCDs  TOTAL TIME TAKING CARE OF THIS PATIENT: 35 minutes.   POSSIBLE D/C IN 1-2 DAYS, DEPENDING ON CLINICAL CONDITION.  Hillary Bow R M.D on 08/04/2016 at 10:16 PM  Between 7am to 6pm - Pager - [phone]  After 6pm go to [email or web address] - password [email or web address] [facility]  Office  [phone]  CC: Primary care physician; [name]., MD  Note: This dictation was prepared with Dragon dictation along with smaller phrase technology. Any transcriptional errors that result from this process are unintentional.

## 2016-08-04 NOTE — Care Management Obs Status (Signed)
[facility] NOTIFICATION   Patient Details  Name: Candace [name] MRN: [id number] Date of Birth: [birth year]   Medicare Observation Status Notification Given:  Yes    Katrina Stack, RN 08/04/2016, 8:48 AM

## 2016-08-04 NOTE — Plan of Care (Signed)
Problem: Education: Goal: Knowledge of [facility] Education information/materials will improve Outcome: Completed/Met Date Met: 08/04/16 Pt admitted for chest pain, on telemetry box [id number] verifed by marcus cooper, NT. Pt's skin intact, just a scar to middle of chest from a CABG, verified by crystal ballentine, RN. NS infusing at 63m/hr.   Problem: Pain Managment: Goal: General experience of comfort will improve Outcome: Progressing Pt complained of chest pain when coming up from the ER and also a headache, tramadol was given with relief. Will continue to monitor.

## 2016-08-04 NOTE — Progress Notes (Signed)
*  PRELIMINARY RESULTS* Echocardiogram 2D Echocardiogram has been performed.  Sherrie Sport 08/04/2016, 11:04 AM

## 2016-08-04 NOTE — Care Management (Signed)
Placed in observation for chest pain and slight ekg changes.  Recent observation stay for similar sx. No discharge needs identified by members of care team at present.

## 2016-08-04 NOTE — Consult Note (Signed)
[facility]  CARDIOLOGY CONSULT NOTE  Patient ID: Candace [name] MRN: [id number] DOB/AGE: [birth year] 66 y.o.  Admit date: 08/03/2016 Referring Physician Sudini Primary Physician Rosanna Randy Primary Cardiologist Nehemiah Massed Reason for Consultation Chest pain  HPI: 66 year old female referred for evaluation chest pain. The patient is status post CABG at [facility] August 2017. She was seen as an outpatient yesterday at which time she reported intermittent episodes of chest discomfort. She describes substernal chest pain, throbbing in nature, without nausea, vomiting, diaphoresis or radiation. Chest pain was unlike what she experienced prior to bypass surgery. EKG revealed sinus rhythm with nonspecific ST elevation in inferior leads which looks similar to prior ECG from a week ago. The patient was admitted to telemetry where she has remained chest pain-free. The patient has ruled out for myocardial infarction by troponin 2. Of note, patient has paroxysmal atrial fibrillation, on Eliquis for stroke prevention. Her last dose of Eliquis was on 08/03/2016.  Review of systems complete and found to be negative unless listed above     Past Medical History:  Diagnosis Date  . Allergy   . Anemia   . Anxiety   . Arrhythmia   . Arthritis   . Coronary artery disease   . Depression   . Diabetes mellitus without complication ([place])   . Dyspnea    doe  . Dysrhythmia   . GERD (gastroesophageal reflux disease)   . Headache   . Hyperlipidemia   . Hypertension   . Myocardial infarction ([place])    2016  . Myocardial infarction with cardiac rehabilitation [facility])    MI 2016/ CABG 8/17    FINISHED CARDIAC REHAB 3 WEEKS AGO  . Panic attack   . Reflux   . Stroke ([place])   . Voice tremor     Past Surgical History:  Procedure Laterality Date  . APPENDECTOMY    . ARTERY BIOPSY Right 04/26/2016   Procedure: BIOPSY TEMPORAL ARTERY;  Surgeon: Margaretha Sheffield, MD;  Location: [facility] ORS;  Service: ENT;   Laterality: Right;  . CARDIAC CATHETERIZATION N/A 11/06/2015   Procedure: Left Heart Cath and Coronary Angiography;  Surgeon: Corey Skains, MD;  Location: [facility] CV LAB;  Service: Cardiovascular;  Laterality: N/A;  . CESAREAN SECTION    . CORONARY ARTERY BYPASS GRAFT N/A 11/10/2015   Procedure: CORONARY ARTERY BYPASS GRAFTING (CABG), ON PUMP, TIMES FOUR, USING LEFT INTERNAL MAMMARY ARTERY, BILATERAL GREATER SAPHENOUS VEINS HARVESTED ENDOSCOPICALLY;  Surgeon: [name], MD;  Location: [place];  Service: Open Heart Surgery;  Laterality: N/A;  LIMA-LAD; SEQ SVG-OM1-OM2; SVG-PL  . TEE WITHOUT CARDIOVERSION N/A 11/10/2015   Procedure: TRANSESOPHAGEAL ECHOCARDIOGRAM (TEE);  Surgeon: [name], MD;  Location: [facility];  Service: Open Heart Surgery;  Laterality: N/A;  . TUBAL LIGATION      Prescriptions Prior to Admission  Medication Sig Dispense Refill Last Dose  . acetaminophen (TYLENOL) 500 MG tablet Take 1,000 mg by mouth every 6 (six) hours as needed.   08/03/2016 at Unknown time  . alendronate (FOSAMAX) 70 MG tablet Take 70 mg by mouth once a week. Take with a full glass of water on an empty stomach.   08/03/2016 at Unknown time  . apixaban (ELIQUIS) 5 MG TABS tablet Take 5 mg by mouth 2 (two) times daily.    08/03/2016 at Unknown time  . calcium carbonate (OS-CAL - DOSED IN MG OF ELEMENTAL CALCIUM) 1250 (500 Ca) MG tablet Take 1 tablet by mouth daily with breakfast.  08/03/2016 at Unknown time  . cetirizine (ZYRTEC) 10 MG tablet Take 10 mg by mouth daily.   08/03/2016 at Unknown time  . docusate sodium (COLACE) 100 MG capsule Take 100 mg by mouth daily as needed for mild constipation.     . insulin glargine (LANTUS) 100 UNIT/ML injection Inject 8 Units into the skin daily.    08/03/2016 at Unknown time  . metFORMIN (GLUCOPHAGE-XR) 750 MG 24 hr tablet Take 1 tablet (750 mg total) by mouth 2 (two) times daily. 60 tablet 1 08/03/2016 at Unknown time  . metoprolol (LOPRESSOR) 50 MG tablet Take  50 mg by mouth 2 (two) times daily.    08/03/2016 at Unknown time  . nitroGLYCERIN (NITROSTAT) 0.4 MG SL tablet Place 1 tablet (0.4 mg total) under the tongue every 5 (five) minutes as needed for chest pain. 30 tablet 0 Taking  . ondansetron (ZOFRAN ODT) 8 MG disintegrating tablet Take 1 tablet (8 mg total) by mouth every 8 (eight) hours as needed for nausea or vomiting. 20 tablet 0 Taking  . Oxcarbazepine (TRILEPTAL) 300 MG tablet Take 1 tablet (300 mg total) by mouth 2 (two) times daily. 60 tablet 1 08/03/2016 at Unknown time  . pantoprazole (PROTONIX) 40 MG tablet Take 40 mg by mouth daily.   08/03/2016 at Unknown time  . rosuvastatin (CRESTOR) 40 MG tablet Take 1 tablet (40 mg total) by mouth daily at 6 PM. 30 tablet 1 08/03/2016 at Unknown time  . traMADol (ULTRAM) 50 MG tablet Take 50 mg by mouth every 6 (six) hours as needed.   Taking  . vitamin B-12 (CYANOCOBALAMIN) 1000 MCG tablet Take 1,000 mcg by mouth daily.   08/03/2016 at Unknown time   Social History   Social History  . Marital status: Divorced    Spouse name: N/A  . Number of children: N/A  . Years of education: N/A   Occupational History  . Not on file.   Social History Main Topics  . Smoking status: Former Smoker    Packs/day: 0.50    Types: Cigarettes    Quit date: 10/07/2001  . Smokeless tobacco: Never Used  . Alcohol use No  . Drug use: No  . Sexual activity: Not Currently   Other Topics Concern  . Not on file   Social History Narrative  . No narrative on file    Family History  Problem Relation Age of Onset  . Cancer Father   . Hypertension Father   . Heart disease Father   . Cancer Mother   . Hypertension Mother   . Cancer Sister   . Breast cancer Sister 81  . Breast cancer Sister 67      Review of systems complete and found to be negative unless listed above      PHYSICAL EXAM  General: Well developed, well nourished, in no acute distress HEENT:  Normocephalic and atramatic Neck:  No JVD.   Lungs: Clear bilaterally to auscultation and percussion. Heart: HRRR . Normal S1 and S2 without gallops or murmurs.  Abdomen: Bowel sounds are positive, abdomen soft and non-tender  Msk:  Back normal, normal gait. Normal strength and tone for age. Extremities: No clubbing, cyanosis or edema.   Neuro: Alert and oriented X 3. Psych:  Good affect, responds appropriately  Labs:   Lab Results  Component Value Date   WBC 9.8 08/03/2016   HGB 11.7 (L) 08/03/2016   HCT 34.9 (L) 08/03/2016   MCV 96.3 08/03/2016   PLT 329 08/03/2016  Recent Labs Lab 08/03/16 1641  NA 139  K 3.6  CL 103  CO2 26  BUN 15  CREATININE 0.48  CALCIUM 9.6  GLUCOSE 172*   Lab Results  Component Value Date   CKTOTAL 91 08/01/2012   CKMB 5.9 (H) 07/14/2013   TROPONINI <0.03 08/04/2016    Lab Results  Component Value Date   CHOL 214 (H) 07/14/2013   CHOL 234 (H) 05/01/2012   CHOL 179 01/24/2012   Lab Results  Component Value Date   HDL 36 (L) 07/14/2013   HDL 56 05/01/2012   HDL 57 01/24/2012   Lab Results  Component Value Date   LDLCALC 154 (H) 07/14/2013   LDLCALC 158 (H) 05/01/2012   LDLCALC 102 (H) 01/24/2012   Lab Results  Component Value Date   TRIG 118 07/14/2013   TRIG 99 05/01/2012   TRIG 100 01/24/2012   No results found for: CHOLHDL No results found for: LDLDIRECT    Radiology: Dg Chest 2 View  Result Date: 08/03/2016 CLINICAL DATA:  Left chest pain and shortness breath. Diaphoresis and nausea. EXAM: CHEST  2 VIEW COMPARISON:  07/26/2016. FINDINGS: Normal sized heart. Clear lungs. Post CABG changes. Diffuse osteopenia. Left shoulder degenerative changes and probable loose bodies. IMPRESSION: No acute abnormality. Electronically Signed   By: Claudie Revering M.D.   On: 08/03/2016 16:56   Dg Chest 2 View  Result Date: 07/26/2016 CLINICAL DATA:  Left-sided chest pain for 2 days EXAM: CHEST  2 VIEW COMPARISON:  12/04/2015 FINDINGS: The heart size and mediastinal contours are  within normal limits. Both lungs are clear. The visualized skeletal structures are unremarkable. IMPRESSION: No active cardiopulmonary disease. Electronically Signed   By: Inez Catalina M.D.   On: 07/26/2016 10:22    EKG: Normal sinus rhythm, without any new acute ischemic ST-T wave changes  ASSESSMENT AND PLAN:   1. Chest pain, with atypical features, nondiagnostic ECG, negative troponin 2. Paroxysmal atrial fibrillation, on [place]a request for stroke prevention, last dose on 08/03/2016  Recommendations  1. Agree with current therapy 2. Hold Eliquis for now 3. [place] study today 4. Further recommendations pending Lexiscan Myoview result  Signed: Isaias Cowman MD,PhD, [facility] 08/04/2016, 9:21 AM

## 2016-08-05 ENCOUNTER — Encounter
Admission: EM | Disposition: A | Discharge status: Home / Self Care | Payer: Self-pay | Source: Home / Self Care | Attending: Internal Medicine

## 2016-08-05 DIAGNOSIS — I10 Essential (primary) hypertension: Secondary | ICD-10-CM | POA: Diagnosis present

## 2016-08-05 DIAGNOSIS — Z79899 Other long term (current) drug therapy: Secondary | ICD-10-CM | POA: Diagnosis not present

## 2016-08-05 DIAGNOSIS — I480 Paroxysmal atrial fibrillation: Secondary | ICD-10-CM | POA: Diagnosis present

## 2016-08-05 DIAGNOSIS — I25110 Atherosclerotic heart disease of native coronary artery with unstable angina pectoris: Secondary | ICD-10-CM | POA: Diagnosis present

## 2016-08-05 DIAGNOSIS — F419 Anxiety disorder, unspecified: Secondary | ICD-10-CM | POA: Diagnosis present

## 2016-08-05 DIAGNOSIS — I200 Unstable angina: Secondary | ICD-10-CM | POA: Diagnosis present

## 2016-08-05 DIAGNOSIS — E785 Hyperlipidemia, unspecified: Secondary | ICD-10-CM | POA: Diagnosis present

## 2016-08-05 DIAGNOSIS — K219 Gastro-esophageal reflux disease without esophagitis: Secondary | ICD-10-CM | POA: Diagnosis present

## 2016-08-05 DIAGNOSIS — R9439 Abnormal result of other cardiovascular function study: Secondary | ICD-10-CM | POA: Diagnosis present

## 2016-08-05 DIAGNOSIS — Z951 Presence of aortocoronary bypass graft: Secondary | ICD-10-CM | POA: Diagnosis not present

## 2016-08-05 DIAGNOSIS — E119 Type 2 diabetes mellitus without complications: Secondary | ICD-10-CM | POA: Diagnosis present

## 2016-08-05 DIAGNOSIS — Z7901 Long term (current) use of anticoagulants: Secondary | ICD-10-CM | POA: Diagnosis not present

## 2016-08-05 DIAGNOSIS — Z955 Presence of coronary angioplasty implant and graft: Secondary | ICD-10-CM | POA: Diagnosis not present

## 2016-08-05 DIAGNOSIS — I252 Old myocardial infarction: Secondary | ICD-10-CM | POA: Diagnosis not present

## 2016-08-05 DIAGNOSIS — Z794 Long term (current) use of insulin: Secondary | ICD-10-CM | POA: Diagnosis not present

## 2016-08-05 DIAGNOSIS — Z8673 Personal history of transient ischemic attack (TIA), and cerebral infarction without residual deficits: Secondary | ICD-10-CM | POA: Diagnosis not present

## 2016-08-05 DIAGNOSIS — Z87891 Personal history of nicotine dependence: Secondary | ICD-10-CM | POA: Diagnosis not present

## 2016-08-05 DIAGNOSIS — Z136 Encounter for screening for cardiovascular disorders: Secondary | ICD-10-CM

## 2016-08-05 DIAGNOSIS — Z880 Allergy status to penicillin: Secondary | ICD-10-CM | POA: Diagnosis not present

## 2016-08-05 HISTORY — PX: LEFT HEART CATH AND CORONARY ANGIOGRAPHY: CATH118249

## 2016-08-05 HISTORY — PX: CORONARY STENT INTERVENTION: CATH118234

## 2016-08-05 LAB — GLUCOSE, CAPILLARY
Glucose-Capillary: 162 mg/dL — ABNORMAL HIGH (ref 65–99)
Glucose-Capillary: 109 mg/dL — ABNORMAL HIGH (ref 65–99)
Glucose-Capillary: 159 mg/dL — ABNORMAL HIGH (ref 65–99)
Glucose-Capillary: 169 mg/dL — ABNORMAL HIGH (ref 65–99)

## 2016-08-05 LAB — POCT ACTIVATED CLOTTING TIME: Activated Clotting Time: 345 seconds

## 2016-08-05 SURGERY — LEFT HEART CATH AND CORONARY ANGIOGRAPHY
Anesthesia: Moderate Sedation

## 2016-08-05 MED ORDER — FENTANYL CITRATE (PF) 100 MCG/2ML IJ SOLN
INTRAMUSCULAR | Status: AC
  Filled 2016-08-05: qty 2

## 2016-08-05 MED ORDER — SODIUM CHLORIDE 0.9 % IV SOLN
250.0000 mL | INTRAVENOUS | Status: DC | PRN
Start: 2016-08-05 — End: 2016-08-05

## 2016-08-05 MED ORDER — IOPAMIDOL (ISOVUE-300) INJECTION 61%
INTRAVENOUS | Status: DC | PRN
  Administered 2016-08-05: 250 mL via INTRA_ARTERIAL

## 2016-08-05 MED ORDER — SODIUM CHLORIDE 0.9 % IV SOLN
INTRAVENOUS | Status: AC | PRN
  Administered 2016-08-05: 1.75 mg/kg/h via INTRAVENOUS

## 2016-08-05 MED ORDER — SODIUM CHLORIDE 0.9% FLUSH
3.0000 mL | Freq: Two times a day (BID) | INTRAVENOUS | Status: DC
  Administered 2016-08-06: 3 mL via INTRAVENOUS

## 2016-08-05 MED ORDER — HYDRALAZINE HCL 20 MG/ML IJ SOLN: 5.0000 mg | INTRAMUSCULAR | Status: AC | PRN

## 2016-08-05 MED ORDER — NITROGLYCERIN 5 MG/ML IV SOLN
INTRAVENOUS | Status: AC
  Filled 2016-08-05: qty 10

## 2016-08-05 MED ORDER — SODIUM CHLORIDE 0.9% FLUSH: 3.0000 mL | INTRAVENOUS | Status: DC | PRN

## 2016-08-05 MED ORDER — NITROGLYCERIN 1 MG/10 ML FOR IR/CATH LAB
INTRA_ARTERIAL | Status: DC | PRN
  Administered 2016-08-05: 200 ug via INTRACORONARY

## 2016-08-05 MED ORDER — CLOPIDOGREL BISULFATE 75 MG PO TABS
75.0000 mg | ORAL_TABLET | Freq: Every day | ORAL | Status: DC
  Administered 2016-08-06: 75 mg via ORAL
  Filled 2016-08-05: qty 1

## 2016-08-05 MED ORDER — SODIUM CHLORIDE 0.9 % WEIGHT BASED INFUSION
3.0000 mL/kg/h | INTRAVENOUS | Status: AC
  Administered 2016-08-05: 3 mL/kg/h via INTRAVENOUS

## 2016-08-05 MED ORDER — MIDAZOLAM HCL 2 MG/2ML IJ SOLN
INTRAMUSCULAR | Status: AC
  Filled 2016-08-05: qty 2

## 2016-08-05 MED ORDER — ASPIRIN 81 MG PO CHEW
CHEWABLE_TABLET | ORAL | Status: DC | PRN
  Administered 2016-08-05: 243 mg via ORAL

## 2016-08-05 MED ORDER — ONDANSETRON HCL 4 MG/2ML IJ SOLN
4.0000 mg | Freq: Four times a day (QID) | INTRAMUSCULAR | Status: DC | PRN
Start: 2016-08-05 — End: 2016-08-06
  Administered 2016-08-06: 4 mg via INTRAVENOUS

## 2016-08-05 MED ORDER — SODIUM CHLORIDE 0.9 % IV SOLN: 250.0000 mL | INTRAVENOUS | Status: DC | PRN

## 2016-08-05 MED ORDER — LABETALOL HCL 5 MG/ML IV SOLN: 10.0000 mg | INTRAVENOUS | Status: AC | PRN

## 2016-08-05 MED ORDER — CLOPIDOGREL BISULFATE 75 MG PO TABS
ORAL_TABLET | ORAL | Status: DC | PRN
  Administered 2016-08-05: 600 mg via ORAL

## 2016-08-05 MED ORDER — BIVALIRUDIN TRIFLUOROACETATE 250 MG IV SOLR
INTRAVENOUS | Status: AC
  Filled 2016-08-05: qty 250

## 2016-08-05 MED ORDER — ASPIRIN 81 MG PO CHEW
81.0000 mg | CHEWABLE_TABLET | Freq: Every day | ORAL | Status: DC
  Administered 2016-08-06: 81 mg via ORAL
  Filled 2016-08-05: qty 1

## 2016-08-05 MED ORDER — ASPIRIN 81 MG PO CHEW
CHEWABLE_TABLET | ORAL | Status: AC
  Filled 2016-08-05: qty 3

## 2016-08-05 MED ORDER — ACETAMINOPHEN 325 MG PO TABS: 650.0000 mg | ORAL_TABLET | ORAL | Status: DC | PRN

## 2016-08-05 MED ORDER — CLOPIDOGREL BISULFATE 300 MG PO TABS
ORAL_TABLET | ORAL | Status: AC
  Filled 2016-08-05: qty 2

## 2016-08-05 MED ORDER — HEPARIN (PORCINE) IN NACL 2-0.9 UNIT/ML-% IJ SOLN
INTRAMUSCULAR | Status: AC
  Filled 2016-08-05: qty 500

## 2016-08-05 MED ORDER — ASPIRIN 81 MG PO CHEW
81.0000 mg | CHEWABLE_TABLET | ORAL | Status: AC
  Administered 2016-08-05: 81 mg via ORAL
  Filled 2016-08-05: qty 1

## 2016-08-05 MED ORDER — BIVALIRUDIN BOLUS VIA INFUSION - CUPID
INTRAVENOUS | Status: DC | PRN
  Administered 2016-08-05: 52.05 mg via INTRAVENOUS

## 2016-08-05 MED ORDER — FENTANYL CITRATE (PF) 100 MCG/2ML IJ SOLN
INTRAMUSCULAR | Status: DC | PRN
Start: 2016-08-05 — End: 2016-08-05
  Administered 2016-08-05: 25 ug via INTRAVENOUS

## 2016-08-05 MED ORDER — SODIUM CHLORIDE 0.9 % WEIGHT BASED INFUSION: 1.0000 mL/kg/h | INTRAVENOUS | Status: DC

## 2016-08-05 MED ORDER — SODIUM CHLORIDE 0.9 % WEIGHT BASED INFUSION: 1.0000 mL/kg/h | INTRAVENOUS | Status: AC

## 2016-08-05 MED ORDER — MIDAZOLAM HCL 2 MG/2ML IJ SOLN
INTRAMUSCULAR | Status: DC | PRN
  Administered 2016-08-05: 1 mg via INTRAVENOUS

## 2016-08-05 SURGICAL SUPPLY — 16 items
CATH INFINITI 5FR ANG PIGTAIL (CATHETERS) ×1 IMPLANT
CATH INFINITI 5FR JL4 (CATHETERS) ×1 IMPLANT
CATH INFINITI JR4 5F (CATHETERS) ×1 IMPLANT
CATH VISTA GUIDE 6FR JR4 SH (CATHETERS) ×1 IMPLANT
CATH VISTA GUIDE 6FR LCB SH (CATHETERS) ×1 IMPLANT
DEVICE CLOSURE MYNXGRIP 6/7F (Vascular Products) ×1 IMPLANT
DEVICE INFLAT 30 PLUS (MISCELLANEOUS) ×1 IMPLANT
GUIDEWIRE 3MM J TIP .035 145 (WIRE) ×1 IMPLANT
KIT MANI 3VAL PERCEP (MISCELLANEOUS) ×2 IMPLANT
NDL PERC 18GX7CM (NEEDLE) IMPLANT
NEEDLE PERC 18GX7CM (NEEDLE) ×2 IMPLANT
PACK CARDIAC CATH (CUSTOM PROCEDURE TRAY) ×2 IMPLANT
SHEATH AVANTI 5FR X 11CM (SHEATH) ×1 IMPLANT
SHEATH AVANTI 6FR X 11CM (SHEATH) ×1 IMPLANT
STENT RESOLUTE ONYX 2.25X8 (Permanent Stent) ×1 IMPLANT
WIRE ASAHI PROWATER 180CM (WIRE) ×1 IMPLANT

## 2016-08-05 NOTE — Progress Notes (Signed)
[place] at [place] NAME: Candace [name]    MR#:  [id number]  DATE OF BIRTH:  1950/11/08  SUBJECTIVE:   Chest pain on and off Chronic headache REVIEW OF SYSTEMS:    Review of Systems  Constitutional: Positive for malaise/fatigue. Negative for chills and fever.  HENT: Negative for sore throat.   Eyes: Negative for blurred vision, double vision and pain.  Respiratory: Negative for cough, hemoptysis, shortness of breath and wheezing.   Cardiovascular: Positive for chest pain. Negative for palpitations, orthopnea and leg swelling.  Gastrointestinal: Negative for abdominal pain, constipation, diarrhea, heartburn, nausea and vomiting.  Genitourinary: Negative for dysuria and hematuria.  Musculoskeletal: Negative for back pain and joint pain.  Skin: Negative for rash.  Neurological: Positive for weakness. Negative for sensory change, speech change, focal weakness and headaches.  Endo/Heme/Allergies: Does not bruise/bleed easily.  Psychiatric/Behavioral: Negative for depression. The patient is not nervous/anxious.     DRUG ALLERGIES:   Allergies  Allergen Reactions  . Penicillins Swelling, Rash and Other (See Comments)    Has patient had a PCN reaction causing immediate rash, facial/tongue/throat swelling, SOB or lightheadedness with hypotension: Yes Has patient had a PCN reaction causing severe rash involving mucus membranes or skin necrosis: Yes Has patient had a PCN reaction that required hospitalization No Has patient had a PCN reaction occurring within the last 10 years: Yes If all of the above answers are "NO", then may proceed with Cephalosporin use.     VITALS:  Blood pressure 129/86, pulse 91, temperature 97.8 F (36.6 C), temperature source Oral, resp. rate 15, height 5\' 3"  (1.6 m), weight 70.1 kg (154 lb 8 oz), SpO2 97 %.  PHYSICAL EXAMINATION:   Physical Exam  GENERAL:  66 y.o.-year-old patient lying in the bed with no  acute distress.  EYES: Pupils equal, round, reactive to light and accommodation. No scleral icterus. Extraocular muscles intact.  HEENT: Head atraumatic, normocephalic. Oropharynx and nasopharynx clear.  NECK:  Supple, no jugular venous distention. No thyroid enlargement, no tenderness.  LUNGS: Normal breath sounds bilaterally, no wheezing, rales, rhonchi. No use of accessory muscles of respiration.  CARDIOVASCULAR: S1, S2 normal. No murmurs, rubs, or gallops.  ABDOMEN: Soft, nontender, nondistended. Bowel sounds present. No organomegaly or mass.  EXTREMITIES: No cyanosis, clubbing or edema b/l.    NEUROLOGIC: Cranial nerves II through XII are intact. No focal Motor or sensory deficits b/l.   PSYCHIATRIC: The patient is alert and oriented x 3.  SKIN: No obvious rash, lesion, or ulcer.   LABORATORY PANEL:   CBC  Recent Labs Lab 08/03/16 1641  WBC 9.8  HGB 11.7*  HCT 34.9*  PLT 329   ------------------------------------------------------------------------------------------------------------------ Chemistries   Recent Labs Lab 08/03/16 1641  NA 139  K 3.6  CL 103  CO2 26  GLUCOSE 172*  BUN 15  CREATININE 0.48  CALCIUM 9.6   ------------------------------------------------------------------------------------------------------------------  Cardiac Enzymes  Recent Labs Lab 08/04/16 0835  TROPONINI <0.03   ------------------------------------------------------------------------------------------------------------------  RADIOLOGY:  Dg Chest 2 View  Result Date: 08/03/2016 CLINICAL DATA:  Left chest pain and shortness breath. Diaphoresis and nausea. EXAM: CHEST  2 VIEW COMPARISON:  07/26/2016. FINDINGS: Normal sized heart. Clear lungs. Post CABG changes. Diffuse osteopenia. Left shoulder degenerative changes and probable loose bodies. IMPRESSION: No acute abnormality. Electronically Signed   By: Claudie Revering M.D.   On: 08/03/2016 16:56   Nm Myocar Multi W/spect W/wall  Motion / Ef  Result Date: 08/04/2016  Blood  pressure demonstrated a normal response to exercise.  There was no ST segment deviation noted during stress.  Defect 1: There is a defect present in the apex location.  This is a low risk study.  The left ventricular ejection fraction is mildly decreased (45-54%).  Findings consistent with ischemia.      ASSESSMENT AND PLAN:  Candace [name]  is a 66 y.o. female who presents with Persistent and recurrent chest pain. Patient was recently admitted for the same. She was in cardiology clinic for follow-up today, and Combivent and her chest pain. EKG there showed some slight new changes.  * Chest pain, atypical. Exertional and non exertional. Troponin x 3 normal. EKG- Nothing new. Stress test with apical reversible ischemia, mild. Discussed with Dr. Saralyn Pilar.  Now on Imdur. Hold eliquis.  ASA, Statin, BB Cath today per cardiology  * CAD s/p CABG in 10/2015 Cont current cardiac meds  * Afib Controlled Hold eliuis for possible cath  * HTN Well controlled  All the records are reviewed and case discussed with Care Management/Social Workerr. Management plans discussed with the patient, family and they are in agreement.  CODE STATUS: FULL CODE  DVT Prophylaxis: SCDs  TOTAL TIME TAKING CARE OF THIS PATIENT: 25 minutes.   POSSIBLE D/C IN 1 DAYS, DEPENDING ON CLINICAL CONDITION.  Skarleth Delmonico M.D on 08/05/2016 at 8:12 AM  Between 7am to 6pm - Pager - [phone]  After 6pm go to [email or web address] - password [email or web address] [facility]  Office  [phone]  CC: Primary care physician; [name]., MD  Note: This dictation was prepared with Dragon dictation along with smaller phrase technology. Any transcriptional errors that result from this process are unintentional.

## 2016-08-05 NOTE — Progress Notes (Signed)
To specials via bed 

## 2016-08-05 NOTE — Care Management (Signed)
Positive stress test 5/9 resulted in cath today and PCI.

## 2016-08-05 NOTE — Plan of Care (Signed)
Problem: Pain Managment: Goal: General experience of comfort will improve Outcome: Not Progressing Pt continues to have chest pain and a headache, treated with tramadol & tylenol. Has only given some relief. Pt does not want nitro SL.  Problem: Activity: Goal: Risk for activity intolerance will decrease Outcome: Progressing Pt up to bathroom with standby assist tolerating very well.

## 2016-08-05 NOTE — Progress Notes (Signed)
Pt returned from cath lab s/p PCI.  Dressing to rt groin is dry and intact, no bleeding noted, pulses equal bil.  Denies chest pain at this time. Family at bedside. CB in reach, SR up x 2.

## 2016-08-05 NOTE — Care Management Obs Status (Signed)
MEDICARE OBSERVATION STATUS NOTIFICATION   Patient Details  Name: Candace [name] MRN: [id number] Date of Birth: [birth year]   Medicare Observation Status Notification Given:  Yes  given yesterday but not showing in column     Katrina Stack, RN 08/05/2016, 8:26 AM

## 2016-08-05 NOTE — Progress Notes (Signed)
[facility] Cardiology  SUBJECTIVE: Patient reports having intermittent chest pain, stabbing and substernal in nature, with diaphoresis and shortness of breath.    Vitals:   08/04/16 1744 08/04/16 2009 08/05/16 0447 08/05/16 0725  BP: 137/78 138/71 (!) 178/80 129/86  Pulse: 93 86 91 91  Resp:  18 18 15   Temp:  98.3 F (36.8 C) 97.8 F (36.6 C) 97.8 F (36.6 C)  TempSrc:  Oral Oral Oral  SpO2:  93% 96% 97%  Weight:      Height:         Intake/Output Summary (Last 24 hours) at 08/05/16 0758 Last data filed at 08/05/16 0459  Gross per 24 hour  Intake              240 ml  Output             1200 ml  Net             -960 ml      PHYSICAL EXAM  General: Well developed, well nourished, in no acute distress HEENT:  Normocephalic and atramatic Neck:  No JVD.  Lungs: Normal effort of breathing Heart: HRRR . Normal S1 and S2 without gallops or murmurs.  Abdomen: Bowel sounds are positive, abdomen soft and non-tender  Msk: Lying in bed Extremities: No clubbing, cyanosis or edema.   Neuro: Alert and oriented X 3. Psych:  Good affect, responds appropriately   LABS: Basic Metabolic Panel:  Recent Labs  08/03/16 1641  NA 139  K 3.6  CL 103  CO2 26  GLUCOSE 172*  BUN 15  CREATININE 0.48  CALCIUM 9.6   Liver Function Tests: No results for input(s): AST, ALT, ALKPHOS, BILITOT, PROT, ALBUMIN in the last 72 hours. No results for input(s): LIPASE, AMYLASE in the last 72 hours. CBC:  Recent Labs  08/03/16 1641  WBC 9.8  HGB 11.7*  HCT 34.9*  MCV 96.3  PLT 329   Cardiac Enzymes:  Recent Labs  08/03/16 2054 08/04/16 0321 08/04/16 0835  TROPONINI <0.03 <0.03 <0.03   BNP: Invalid input(s): POCBNP D-Dimer: No results for input(s): DDIMER in the last 72 hours. Hemoglobin A1C: No results for input(s): HGBA1C in the last 72 hours. Fasting Lipid Panel: No results for input(s): CHOL, HDL, LDLCALC, TRIG, CHOLHDL, LDLDIRECT in the last 72 hours. Thyroid Function  Tests: No results for input(s): TSH, T4TOTAL, T3FREE, THYROIDAB in the last 72 hours.  Invalid input(s): FREET3 Anemia Panel: No results for input(s): VITAMINB12, FOLATE, FERRITIN, TIBC, IRON, RETICCTPCT in the last 72 hours.  Dg Chest 2 View  Result Date: 08/03/2016 CLINICAL DATA:  Left chest pain and shortness breath. Diaphoresis and nausea. EXAM: CHEST  2 VIEW COMPARISON:  07/26/2016. FINDINGS: Normal sized heart. Clear lungs. Post CABG changes. Diffuse osteopenia. Left shoulder degenerative changes and probable loose bodies. IMPRESSION: No acute abnormality. Electronically Signed   By: Claudie Revering M.D.   On: 08/03/2016 16:56   Nm Myocar Multi W/spect W/wall Motion / Ef  Result Date: 08/04/2016  Blood pressure demonstrated a normal response to exercise.  There was no ST segment deviation noted during stress.  Defect 1: There is a defect present in the apex location.  This is a low risk study.  The left ventricular ejection fraction is mildly decreased (45-54%).  Findings consistent with ischemia.      Echo EF 55-65%  TELEMETRY: Normal sinus rhythm, rate 83 bpm  ASSESSMENT AND PLAN:  Principal Problem:   Unstable angina (HCC) Active Problems:  Arteriosclerosis of coronary artery   Acid reflux   Benign essential HTN   Diabetes ([place])    1. Chest pain, with atypical features, negative troponin, with recent nuclear stress test revealing mild apical ischemia 2. Paroxysmal atrial fibrillation, on Eliquis for stroke prevention, with last dose on 08/03/2016  Recommendations: 1. Agree with current therapy 2. Continue to hold Eliquis 3. The risks, benefits, and alternatives of cardiac catheterization were discussed with the patient, and written consent was obtained from the patient. 4. Proceed with left heart catheterization today.    Clabe Seal, PA-C 08/05/2016 7:58 AM

## 2016-08-05 NOTE — Telephone Encounter (Signed)
Please review-aa 

## 2016-08-05 NOTE — Plan of Care (Signed)
Problem: Safety: Goal: Ability to remain free from injury will improve Outcome: Progressing Fall precautions in place, non skid socks when oob  Problem: Pain Managment: Goal: General experience of comfort will improve Outcome: Not Progressing Continuous complaints of pain, prn medications  Problem: Cardiac: Goal: Ability to achieve and maintain adequate cardiovascular perfusion will improve Outcome: Not Progressing Pt going for heart cath today

## 2016-08-06 ENCOUNTER — Encounter: Payer: Self-pay | Admitting: Cardiology

## 2016-08-06 LAB — CBC
HCT: 34.7 % — ABNORMAL LOW (ref 35.0–47.0)
Hemoglobin: 11.5 g/dL — ABNORMAL LOW (ref 12.0–16.0)
MCH: 32.4 pg (ref 26.0–34.0)
MCHC: 33.3 g/dL (ref 32.0–36.0)
MCV: 97.4 fL (ref 80.0–100.0)
Platelets: 268 10*3/uL (ref 150–440)
RBC: 3.56 MIL/uL — ABNORMAL LOW (ref 3.80–5.20)
RDW: 15.3 % — ABNORMAL HIGH (ref 11.5–14.5)
WBC: 5.9 10*3/uL (ref 3.6–11.0)

## 2016-08-06 LAB — BASIC METABOLIC PANEL
Anion gap: 7 (ref 5–15)
BUN: 9 mg/dL (ref 6–20)
CO2: 28 mmol/L (ref 22–32)
Calcium: 8.8 mg/dL — ABNORMAL LOW (ref 8.9–10.3)
Chloride: 107 mmol/L (ref 101–111)
Creatinine, Ser: 0.4 mg/dL — ABNORMAL LOW (ref 0.44–1.00)
GFR calc Af Amer: >60 mL/min (ref >60)
GFR calc non Af Amer: >60 mL/min (ref >60)
Glucose, Bld: 128 mg/dL — ABNORMAL HIGH (ref 65–99)
Potassium: 3.7 mmol/L (ref 3.5–5.1)
Sodium: 142 mmol/L (ref 135–145)

## 2016-08-06 LAB — GLUCOSE, CAPILLARY: Glucose-Capillary: 148 mg/dL — ABNORMAL HIGH (ref 65–99)

## 2016-08-06 MED ORDER — ALUM & MAG HYDROXIDE-SIMETH 200-200-20 MG/5ML PO SUSP
15.0000 mL | ORAL | Status: DC | PRN
  Administered 2016-08-06: 15 mL via ORAL
  Filled 2016-08-06: qty 30

## 2016-08-06 MED ORDER — ISOSORBIDE MONONITRATE ER 30 MG PO TB24: 30.0000 mg | ORAL_TABLET | Freq: Every day | ORAL | 1 refills | Status: DC

## 2016-08-06 MED ORDER — ASPIRIN 81 MG PO CHEW: 81.0000 mg | CHEWABLE_TABLET | Freq: Every day | ORAL | 1 refills | Status: DC

## 2016-08-06 MED ORDER — ONDANSETRON HCL 4 MG/2ML IJ SOLN: 4.0000 mg | Freq: Four times a day (QID) | INTRAMUSCULAR | 0 refills | Status: DC | PRN

## 2016-08-06 MED ORDER — CLOPIDOGREL BISULFATE 75 MG PO TABS: 75.0000 mg | ORAL_TABLET | Freq: Every day | ORAL | 1 refills | Status: DC

## 2016-08-06 NOTE — Progress Notes (Signed)
A & O. Room air. NSr. Takes meds ok. Pt did complain of nausea and received zofran. Iv and tele remvoed. Discharge instructions given to pt. Pt has no further concerns at this time.

## 2016-08-06 NOTE — Care Management (Signed)
Not discharging home on cost prohibitive antiplatelets

## 2016-08-06 NOTE — Discharge Summary (Signed)
[facility] NAME: Candace [name]    MR#:  [id number]  DATE OF BIRTH:  [birth year]  DATE OF ADMISSION:  08/03/2016 ADMITTING PHYSICIAN: Lance Coon, MD  DATE OF DISCHARGE: 5.11.2018  PRIMARY CARE PHYSICIAN: [name]., MD    ADMISSION DIAGNOSIS:  Unstable angina ([place]) [I20.0]  DISCHARGE DIAGNOSIS:  Unstable angina with abnormal stress test s/p PCI and Cath with DES to anastomotic site of SVG CAD s/p CABG in 10/2015 HTN  SECONDARY DIAGNOSIS:   Past Medical History:  Diagnosis Date  . Allergy   . Anemia   . Anxiety   . Arrhythmia   . Arthritis   . Coronary artery disease   . Depression   . Diabetes mellitus without complication ([place])   . Dyspnea    doe  . Dysrhythmia   . GERD (gastroesophageal reflux disease)   . Headache   . Hyperlipidemia   . Hypertension   . Myocardial infarction ([place])    2016  . Myocardial infarction with cardiac rehabilitation [facility])    MI 2016/ CABG 8/17    FINISHED CARDIAC REHAB 3 WEEKS AGO  . Panic attack   . Reflux   . Stroke ([place])   . Voice tremor     HOSPITAL COURSE:  [name] a 66 y.o.femalewho presents with Persistent and recurrent chest pain. Patient was recently admitted for the same. She was in cardiology clinic for follow-up today, and Combivent and her chest pain. EKG there showed some slight new changes.  * Unstable angina with abnormal stress test -Troponin x 3 normal. EKG- Nothing new. -Stress test with apical reversible ischemia, mild. -Discussed with Dr. Saralyn Pilar. - Cath done on 08/05/16 showed 1. Two-vessel coronary artery disease.2. Patent LIMA to LAD, patent SVG to PDA, patent SVG to OM 2 with high-grade 95% stenosis at the anastomotic site3. Normal left ventricular function 4. Successful DES with 2.25 x 8 mm Xience Alpine stent at the anastomotic site of SVG going to -Hold eliquis for couple months per Dr Josefa Half. Cont asa +plavix -Cont  Imdur,Statin, BB  * CAD s/p CABG in 10/2015 Cont current cardiac meds  * Afib Controlled Not on eliuqis at present. Per cardiology hold for now since pt just had stent placement and currently on ASA +plavix  * HTN Well controlled  D/c home  CONSULTS OBTAINED:  Treatment Team:  Isaias Cowman, MD  DRUG ALLERGIES:   Allergies  Allergen Reactions  . Penicillins Swelling, Rash and Other (See Comments)    Has patient had a PCN reaction causing immediate rash, facial/tongue/throat swelling, SOB or lightheadedness with hypotension: Yes Has patient had a PCN reaction causing severe rash involving mucus membranes or skin necrosis: Yes Has patient had a PCN reaction that required hospitalization No Has patient had a PCN reaction occurring within the last 10 years: Yes If all of the above answers are "NO", then may proceed with Cephalosporin use.     DISCHARGE MEDICATIONS:   Current Discharge Medication List    START taking these medications   Details  aspirin 81 MG chewable tablet Chew 1 tablet (81 mg total) by mouth daily. Qty: 30 tablet, Refills: 1    clopidogrel (PLAVIX) 75 MG tablet Take 1 tablet (75 mg total) by mouth daily with breakfast. Qty: 30 tablet, Refills: 1    isosorbide mononitrate (IMDUR) 30 MG 24 hr tablet Take 1 tablet (30 mg total) by mouth daily. Qty: 30 tablet, Refills: 1  CONTINUE these medications which have NOT CHANGED   Details  acetaminophen (TYLENOL) 500 MG tablet Take 1,000 mg by mouth every 6 (six) hours as needed.    alendronate (FOSAMAX) 70 MG tablet Take 70 mg by mouth once a week. Take with a full glass of water on an empty stomach.    calcium carbonate (OS-CAL - DOSED IN MG OF ELEMENTAL CALCIUM) 1250 (500 Ca) MG tablet Take 1 tablet by mouth daily with breakfast.    cetirizine (ZYRTEC) 10 MG tablet Take 10 mg by mouth daily.    docusate sodium (COLACE) 100 MG capsule Take 100 mg by mouth daily as needed for mild  constipation.    insulin glargine (LANTUS) 100 UNIT/ML injection Inject 8 Units into the skin daily.     metFORMIN (GLUCOPHAGE-XR) 750 MG 24 hr tablet Take 1 tablet (750 mg total) by mouth 2 (two) times daily. Qty: 60 tablet, Refills: 1    metoprolol (LOPRESSOR) 50 MG tablet Take 50 mg by mouth 2 (two) times daily.     nitroGLYCERIN (NITROSTAT) 0.4 MG SL tablet Place 1 tablet (0.4 mg total) under the tongue every 5 (five) minutes as needed for chest pain. Qty: 30 tablet, Refills: 0    ondansetron (ZOFRAN ODT) 8 MG disintegrating tablet Take 1 tablet (8 mg total) by mouth every 8 (eight) hours as needed for nausea or vomiting. Qty: 20 tablet, Refills: 0    Oxcarbazepine (TRILEPTAL) 300 MG tablet Take 1 tablet (300 mg total) by mouth 2 (two) times daily. Qty: 60 tablet, Refills: 1   Associated Diagnoses: Trigeminal neuralgia    pantoprazole (PROTONIX) 40 MG tablet Take 40 mg by mouth daily.    rosuvastatin (CRESTOR) 40 MG tablet Take 1 tablet (40 mg total) by mouth daily at 6 PM. Qty: 30 tablet, Refills: 1    traMADol (ULTRAM) 50 MG tablet Take 50 mg by mouth every 6 (six) hours as needed.    vitamin B-12 (CYANOCOBALAMIN) 1000 MCG tablet Take 1,000 mcg by mouth daily.      STOP taking these medications     apixaban (ELIQUIS) 5 MG TABS tablet         If you experience worsening of your admission symptoms, develop shortness of breath, life threatening emergency, suicidal or homicidal thoughts you must seek medical attention immediately by calling 911 or calling your MD immediately  if symptoms less severe.  You Must read complete instructions/literature along with all the possible adverse reactions/side effects for all the Medicines you take and that have been prescribed to you. Take any new Medicines after you have completely understood and accept all the possible adverse reactions/side effects.   Please note  You were cared for by a hospitalist during your hospital stay. If  you have any questions about your discharge medications or the care you received while you were in the hospital after you are discharged, you can call the unit and asked to speak with the hospitalist on call if the hospitalist that took care of you is not available. Once you are discharged, your primary care physician will handle any further medical issues. Please note that NO REFILLS for any discharge medications will be authorized once you are discharged, as it is imperative that you return to your primary care physician (or establish a relationship with a primary care physician if you do not have one) for your aftercare needs so that they can reassess your need for medications and monitor your lab values. Today   SUBJECTIVE  Doing well  VITAL SIGNS:  Blood pressure (!) 172/93, pulse 91, temperature 98.3 F (36.8 C), temperature source Oral, resp. rate 18, height 5\' 3"  (1.6 m), weight 69.4 kg (152 lb 14.4 oz), SpO2 100 %.  I/O:   Intake/Output Summary (Last 24 hours) at 08/06/16 0815 Last data filed at 08/06/16 0506  Gross per 24 hour  Intake           663.53 ml  Output             1800 ml  Net         -1136.47 ml    PHYSICAL EXAMINATION:  GENERAL:  66 y.o.-year-old patient lying in the bed with no acute distress.  EYES: Pupils equal, round, reactive to light and accommodation. No scleral icterus. Extraocular muscles intact.  HEENT: Head atraumatic, normocephalic. Oropharynx and nasopharynx clear.  NECK:  Supple, no jugular venous distention. No thyroid enlargement, no tenderness.  LUNGS: Normal breath sounds bilaterally, no wheezing, rales,rhonchi or crepitation. No use of accessory muscles of respiration.  CARDIOVASCULAR: S1, S2 normal. No murmurs, rubs, or gallops.  ABDOMEN: Soft, non-tender, non-distended. Bowel sounds present. No organomegaly or mass.  EXTREMITIES: No pedal edema, cyanosis, or clubbing.  NEUROLOGIC: Cranial nerves II through XII are intact. Muscle strength 5/5  in all extremities. Sensation intact. Gait not checked.  PSYCHIATRIC: The patient is alert and oriented x 3.  SKIN: No obvious rash, lesion, or ulcer.   DATA REVIEW:   CBC   Recent Labs Lab 08/06/16 0449  WBC 5.9  HGB 11.5*  HCT 34.7*  PLT 268    Chemistries   Recent Labs Lab 08/06/16 0449  NA 142  K 3.7  CL 107  CO2 28  GLUCOSE 128*  BUN 9  CREATININE 0.40*  CALCIUM 8.8*    Microbiology Results   No results found for this or any previous visit (from the past 240 hour(s)).  RADIOLOGY:  Nm Myocar Multi W/spect W/wall Motion / Ef  Result Date: 08/04/2016  Blood pressure demonstrated a normal response to exercise.  There was no ST segment deviation noted during stress.  Defect 1: There is a defect present in the apex location.  This is a low risk study.  The left ventricular ejection fraction is mildly decreased (45-54%).  Findings consistent with ischemia.      Management plans discussed with the patient, family and they are in agreement.  CODE STATUS:     Code Status Orders        Start     Ordered   08/03/16 2307  Full code  Continuous     08/03/16 2306    Code Status History    Date Active Date Inactive Code Status Order ID Comments User Context   07/26/2016  4:39 PM 07/27/2016  6:11 PM Full Code [id number]  Demetrios Loll, MD Inpatient   11/07/2015  5:11 PM 11/10/2015  3:52 PM Full Code [id number]  John Giovanni, PA-C Inpatient   11/05/2015  9:55 PM 11/07/2015  4:46 PM Full Code [id number]  Henreitta Leber, MD Inpatient    Advance Directive Documentation     Most Recent Value  Type of Advance Directive  Healthcare Power of Attorney  Pre-existing out of facility DNR order (yellow form or pink MOST form)  -  "MOST" Form in Place?  -      TOTAL TIME TAKING CARE OF THIS PATIENT: 40 minutes.    Eliyohu Class M.D on 08/06/2016 at 8:15 AM  Between 7am  to 6pm - Pager - [phone] After 6pm go to [email or web address] - password [email or web address] [place]  [facility]  Office  [phone]  CC: Primary care physician; [name]., MD

## 2016-08-06 NOTE — Progress Notes (Signed)
[facility] Cardiology  SUBJECTIVE: I don't have chest pain   Vitals:   08/05/16 1542 08/05/16 1605 08/05/16 1951 08/06/16 0325  BP: (!) 153/88 (!) 147/83 137/72 (!) 172/93  Pulse: 74 88 84 91  Resp: 14 18 18 18   Temp:  97.5 F (36.4 C) 98.6 F (37 C) 98.3 F (36.8 C)  TempSrc:  Oral Oral Oral  SpO2: 95%  97% 100%  Weight:      Height:         Intake/Output Summary (Last 24 hours) at 08/06/16 9233 Last data filed at 08/06/16 0076  Gross per 24 hour  Intake           663.53 ml  Output             1800 ml  Net         -1136.47 ml      PHYSICAL EXAM  General: Well developed, well nourished, in no acute distress HEENT:  Normocephalic and atramatic Neck:  No JVD.  Lungs: Clear bilaterally to auscultation and percussion. Heart: HRRR . Normal S1 and S2 without gallops or murmurs.  Abdomen: Bowel sounds are positive, abdomen soft and non-tender  Msk:  Back normal, normal gait. Normal strength and tone for age. Extremities: No clubbing, cyanosis or edema.   Neuro: Alert and oriented X 3. Psych:  Good affect, responds appropriately   LABS: Basic Metabolic Panel:  Recent Labs  08/03/16 1641 08/06/16 0449  NA 139 142  K 3.6 3.7  CL 103 107  CO2 26 28  GLUCOSE 172* 128*  BUN 15 9  CREATININE 0.48 0.40*  CALCIUM 9.6 8.8*   Liver Function Tests: No results for input(s): AST, ALT, ALKPHOS, BILITOT, PROT, ALBUMIN in the last 72 hours. No results for input(s): LIPASE, AMYLASE in the last 72 hours. CBC:  Recent Labs  08/03/16 1641 08/06/16 0449  WBC 9.8 5.9  HGB 11.7* 11.5*  HCT 34.9* 34.7*  MCV 96.3 97.4  PLT 329 268   Cardiac Enzymes:  Recent Labs  08/03/16 2054 08/04/16 0321 08/04/16 0835  TROPONINI <0.03 <0.03 <0.03   BNP: Invalid input(s): POCBNP D-Dimer: No results for input(s): DDIMER in the last 72 hours. Hemoglobin A1C: No results for input(s): HGBA1C in the last 72 hours. Fasting Lipid Panel: No results for input(s): CHOL, HDL, LDLCALC, TRIG,  CHOLHDL, LDLDIRECT in the last 72 hours. Thyroid Function Tests: No results for input(s): TSH, T4TOTAL, T3FREE, THYROIDAB in the last 72 hours.  Invalid input(s): FREET3 Anemia Panel: No results for input(s): VITAMINB12, FOLATE, FERRITIN, TIBC, IRON, RETICCTPCT in the last 72 hours.  Nm Myocar Multi W/spect W/wall Motion / Ef  Result Date: 08/04/2016  Blood pressure demonstrated a normal response to exercise.  There was no ST segment deviation noted during stress.  Defect 1: There is a defect present in the apex location.  This is a low risk study.  The left ventricular ejection fraction is mildly decreased (45-54%).  Findings consistent with ischemia.      Echo EF 55-65%  TELEMETRY: Normal sinus rhythm:  ASSESSMENT AND PLAN:  Principal Problem:   Unstable angina (HCC) Active Problems:   Arteriosclerosis of coronary artery   Acid reflux   Benign essential HTN   Diabetes (HCC)   Abnormal stress test    1. Two-vessel coronary artery disease, with patent LIMA and LAD, patent SVG to PDA, patent SVG to OM 2 with high-grade 95% stenosis at the anastomotic site, status post DES, with resolution of chest pain  2. Paroxysmal atrial fibrillation  Recommendations  1. Agree with current therapy 2. Continue dual antiplatelet therapy 3. Eliquis for now, will consider restarting as outpatient 4. Follow-up in one week   Isaias Cowman, MD, PhD, [facility] 08/06/2016 8:11 AM

## 2016-08-06 NOTE — Care Management Important Message (Signed)
Important Message  Patient Details  Name: Candace [name] MRN: [id number] Date of Birth: [birth year]   Medicare Important Message Given:  Yes Signed IM notice given    Katrina Stack, RN 08/06/2016, 8:22 AM

## 2016-08-10 NOTE — Telephone Encounter (Signed)
Transition Care Management Follow-up Telephone Call    Date discharged? 08/06/16  How have you been since you were released from the hospital? Still experiencing chest tightness, which was present upon leaving hospital. Complains of light headedness and weakness.  Any patient concerns? None, pt placed call to cardiologist to inquire about another prescription to take for chest tightness. Pt has not received a call back yet.   Items Reviewed:  Medications reviewed: Yes  Allergies reviewed: Yes  Dietary changes reviewed: N/A  Referrals reviewed: Yes, Dr. Saralyn Pilar on 08/13/16   Functional Questionnaire:  Independent - I Dependent - D    Activities of Daily Living (ADLs):    Personal hygiene - I Dressing - I Eating - I Maintaining continence - I Transferring - I   Independent Activities of Daily Living (iADLs): Basic communication skills - I Transportation - D Meal preparation  - I Shopping - D Housework - D Managing medications - I  Managing personal finances - I   Confirmed importance and date/time of follow-up visits scheduled YES  Provider Appointment booked with PCP 08/11/16 @2 :37 PM  Confirmed with patient if condition begins to worsen call PCP or go to the ER.  Patient was given the office number and encouraged to call back with question or concerns: YES

## 2016-08-10 NOTE — Telephone Encounter (Signed)
LMTCB, pt has apt tomorrow 08/11/16 with Dr Rosanna Randy.

## 2016-08-11 ENCOUNTER — Encounter: Payer: Self-pay | Admitting: Family Medicine

## 2016-08-11 ENCOUNTER — Ambulatory Visit (INDEPENDENT_AMBULATORY_CARE_PROVIDER_SITE_OTHER): Payer: Medicare Other | Admitting: Family Medicine

## 2016-08-11 VITALS — BP 108/64 | HR 74 | Temp 98.1°F | Resp 16 | Wt 158.0 lb

## 2016-08-11 DIAGNOSIS — R079 Chest pain, unspecified: Secondary | ICD-10-CM | POA: Diagnosis not present

## 2016-08-11 NOTE — Progress Notes (Signed)
Patient: Candace [name] Female    DOB: 06-16-1950   66 y.o.   MRN: [id number] Visit Date: 08/11/2016  Today's Provider: [name], MD   Chief Complaint  Patient presents with  . Chest Pain   Subjective:    Follow up Hospitalization   Patient was admitted to [facility] on 08/03/2016 and discharged on 08/06/2016. She was treated for unstable angina. Treatment for this included cath placed on 08/05/2016. Patient is to hold Eliquis for now and continue ASA 66m and Plavix. Patient is to also start the isosorbide 364mdaily.  Telephone follow up was done on 08/04/2016 with McFabio NeighborsLPN She reports this condition is Unchanged.   Chest Pain   This is a recurrent problem. The problem occurs 2 to 4 times per day (over the last few weeks). The problem has been unchanged. The pain is present in the substernal region. The pain is moderate (can be severe). The quality of the pain is described as pressure and tightness (describes it as "butterflies" in her chest). The pain radiates to the upper back. Associated symptoms include dizziness, headaches and shortness of breath. She has tried nitroglycerin (patient reports that she took a nitroglycerin tablet around 9:30 this morning) for the symptoms. The treatment provided mild relief.       Allergies  Allergen Reactions  . Penicillins Swelling, Rash and Other (See Comments)    Has patient had a PCN reaction causing immediate rash, facial/tongue/throat swelling, SOB or lightheadedness with hypotension: Yes Has patient had a PCN reaction causing severe rash involving mucus membranes or skin necrosis: Yes Has patient had a PCN reaction that required hospitalization No Has patient had a PCN reaction occurring within the last 10 years: Yes If all of the above answers are "NO", then may proceed with Cephalosporin use.      Current Outpatient Prescriptions:  .  acetaminophen (TYLENOL) 500 MG tablet, Take 1,000 mg by mouth every 6  (six) hours as needed., Disp: , Rfl:  .  alendronate (FOSAMAX) 70 MG tablet, Take 70 mg by mouth once a week. Take with a full glass of water on an empty stomach., Disp: , Rfl:  .  aspirin 81 MG chewable tablet, Chew 1 tablet (81 mg total) by mouth daily., Disp: 30 tablet, Rfl: 1 .  calcium carbonate (OS-CAL - DOSED IN MG OF ELEMENTAL CALCIUM) 1250 (500 Ca) MG tablet, Take 1 tablet by mouth daily with breakfast., Disp: , Rfl:  .  cetirizine (ZYRTEC) 10 MG tablet, Take 10 mg by mouth daily., Disp: , Rfl:  .  clopidogrel (PLAVIX) 75 MG tablet, Take 1 tablet (75 mg total) by mouth daily with breakfast., Disp: 30 tablet, Rfl: 1 .  docusate sodium (COLACE) 100 MG capsule, Take 100 mg by mouth daily as needed for mild constipation., Disp: , Rfl:  .  insulin glargine (LANTUS) 100 UNIT/ML injection, Inject 8 Units into the skin daily. , Disp: , Rfl:  .  isosorbide mononitrate (IMDUR) 30 MG 24 hr tablet, Take 1 tablet (30 mg total) by mouth daily., Disp: 30 tablet, Rfl: 1 .  metFORMIN (GLUCOPHAGE-XR) 750 MG 24 hr tablet, Take 1 tablet (750 mg total) by mouth 2 (two) times daily., Disp: 60 tablet, Rfl: 1 .  metoprolol (LOPRESSOR) 50 MG tablet, Take 50 mg by mouth 2 (two) times daily. , Disp: , Rfl:  .  nitroGLYCERIN (NITROSTAT) 0.4 MG SL tablet, Place 1 tablet (0.4 mg total) under the tongue every  5 (five) minutes as needed for chest pain., Disp: 30 tablet, Rfl: 0 .  ondansetron (ZOFRAN ODT) 8 MG disintegrating tablet, Take 1 tablet (8 mg total) by mouth every 8 (eight) hours as needed for nausea or vomiting., Disp: 20 tablet, Rfl: 0 .  Oxcarbazepine (TRILEPTAL) 300 MG tablet, Take 1 tablet (300 mg total) by mouth 2 (two) times daily., Disp: 60 tablet, Rfl: 1 .  pantoprazole (PROTONIX) 40 MG tablet, Take 40 mg by mouth daily., Disp: , Rfl:  .  rosuvastatin (CRESTOR) 40 MG tablet, Take 1 tablet (40 mg total) by mouth daily at 6 PM., Disp: 30 tablet, Rfl: 1 .  traMADol (ULTRAM) 50 MG tablet, Take 50 mg by mouth  every 6 (six) hours as needed., Disp: , Rfl:  .  vitamin B-12 (CYANOCOBALAMIN) 1000 MCG tablet, Take 1,000 mcg by mouth daily., Disp: , Rfl:   Review of Systems  Constitutional: Positive for fatigue.  Eyes: Negative.   Respiratory: Positive for shortness of breath.   Cardiovascular: Positive for chest pain.  Gastrointestinal: Negative.   Endocrine: Negative.   Allergic/Immunologic: Negative.   Neurological: Positive for dizziness and headaches.  Hematological: Negative.   Psychiatric/Behavioral: Negative.     Social History  Substance Use Topics  . Smoking status: Former Smoker    Packs/day: 0.50    Types: Cigarettes    Quit date: 10/07/2001  . Smokeless tobacco: Never Used  . Alcohol use No   Objective:   BP 108/64 (BP Location: Left Arm, Patient Position: Sitting, Cuff Size: Normal)   Pulse 74   Temp 98.1 F (36.7 C)   Resp 16   Wt 158 lb (71.7 kg)   SpO2 99%   BMI 27.99 kg/m  Vitals:   08/11/16 1513  BP: 108/64  Pulse: 74  Resp: 16  Temp: 98.1 F (36.7 C)  SpO2: 99%  Weight: 158 lb (71.7 kg)     Physical Exam  Constitutional: She is oriented to person, place, and time. She appears well-developed and well-nourished.  HENT:  Head: Normocephalic and atraumatic.  Right Ear: External ear normal.  Left Ear: External ear normal.  Nose: Nose normal.  Eyes: Conjunctivae are normal. No scleral icterus.  Neck: No thyromegaly present.  Cardiovascular: Normal rate, regular rhythm and normal heart sounds.   Pulmonary/Chest: Effort normal and breath sounds normal.  Abdominal: Soft.  Neurological: She is alert and oriented to person, place, and time.  Skin: Skin is warm and dry.  Psychiatric: She has a normal mood and affect. Her behavior is normal. Judgment and thought content normal.        Assessment & Plan:     1. Chest pain, unspecified type  - EKG 12-Lead - CBC with Differential/Platelet - Comprehensive metabolic panel - Sed Rate (ESR) - Lipase -  Helicobacter pylori abs-IgG+IgA, bld 2.Headache 3.Fatigue      I have done the exam and reviewed the above chart and it is accurate to the best of my knowledge. Development worker, community has been used in this note in any air is in the dictation or transcription are unintentional.  [name], MD  [place]

## 2016-08-11 NOTE — Patient Instructions (Addendum)
Increase isosorbide to twice daily.

## 2016-08-17 ENCOUNTER — Telehealth: Payer: Self-pay | Admitting: Family Medicine

## 2016-08-17 LAB — COMPREHENSIVE METABOLIC PANEL
ALT: 12 IU/L (ref 0–32)
AST: 14 IU/L (ref 0–40)
Albumin/Globulin Ratio: 1.5 (ref 1.2–2.2)
Albumin: 3.7 g/dL (ref 3.6–4.8)
Alkaline Phosphatase: 98 IU/L (ref 39–117)
BUN/Creatinine Ratio: 21 (ref 12–28)
BUN: 14 mg/dL (ref 8–27)
Bilirubin Total: <0.2 mg/dL (ref 0.0–1.2)
CO2: 23 mmol/L (ref 18–29)
Calcium: 9.3 mg/dL (ref 8.7–10.3)
Chloride: 99 mmol/L (ref 96–106)
Creatinine, Ser: 0.68 mg/dL (ref 0.57–1.00)
GFR calc Af Amer: 106 mL/min/{1.73_m2} (ref >59)
GFR calc non Af Amer: 92 mL/min/{1.73_m2} (ref >59)
Globulin, Total: 2.4 g/dL (ref 1.5–4.5)
Glucose: 106 mg/dL — ABNORMAL HIGH (ref 65–99)
Potassium: 4.8 mmol/L (ref 3.5–5.2)
Sodium: 139 mmol/L (ref 134–144)
Total Protein: 6.1 g/dL (ref 6.0–8.5)

## 2016-08-17 LAB — CBC WITH DIFFERENTIAL/PLATELET
Basophils Absolute: 0 10*3/uL (ref 0.0–0.2)
Basos: 0 %
EOS (ABSOLUTE): 0.1 10*3/uL (ref 0.0–0.4)
Eos: 2 %
Hematocrit: 32.6 % — ABNORMAL LOW (ref 34.0–46.6)
Hemoglobin: 10.3 g/dL — ABNORMAL LOW (ref 11.1–15.9)
Immature Grans (Abs): 0 10*3/uL (ref 0.0–0.1)
Immature Granulocytes: 0 %
Lymphocytes Absolute: 2.8 10*3/uL (ref 0.7–3.1)
Lymphs: 39 %
MCH: 30.7 pg (ref 26.6–33.0)
MCHC: 31.6 g/dL (ref 31.5–35.7)
MCV: 97 fL (ref 79–97)
Monocytes Absolute: 0.4 10*3/uL (ref 0.1–0.9)
Monocytes: 6 %
Neutrophils Absolute: 3.9 10*3/uL (ref 1.4–7.0)
Neutrophils: 53 %
Platelets: 305 10*3/uL (ref 150–379)
RBC: 3.36 x10E6/uL — ABNORMAL LOW (ref 3.77–5.28)
RDW: 14.3 % (ref 12.3–15.4)
WBC: 7.3 10*3/uL (ref 3.4–10.8)

## 2016-08-17 LAB — HELICOBACTER PYLORI ABS-IGG+IGA, BLD
H. pylori, IgA Abs: <9 units (ref 0.0–8.9)
H. pylori, IgG AbS: <0.8 Index Value (ref 0.00–0.79)

## 2016-08-17 LAB — LIPASE: Lipase: 48 U/L (ref 14–72)

## 2016-08-17 LAB — SEDIMENTATION RATE: Sed Rate: 19 mm/hr (ref 0–40)

## 2016-08-17 NOTE — Telephone Encounter (Signed)
Pt advised of lab results-aa

## 2016-08-17 NOTE — Telephone Encounter (Signed)
Pt is returning call.  CB#[phone]/MW

## 2016-08-18 ENCOUNTER — Encounter: Payer: Self-pay | Admitting: Family Medicine

## 2016-08-18 ENCOUNTER — Ambulatory Visit (INDEPENDENT_AMBULATORY_CARE_PROVIDER_SITE_OTHER): Payer: Medicare Other | Admitting: Family Medicine

## 2016-08-18 VITALS — BP 112/62 | HR 94 | Temp 98.1°F | Resp 20 | Wt 158.0 lb

## 2016-08-18 DIAGNOSIS — R51 Headache: Secondary | ICD-10-CM | POA: Diagnosis not present

## 2016-08-18 DIAGNOSIS — Z794 Long term (current) use of insulin: Secondary | ICD-10-CM

## 2016-08-18 DIAGNOSIS — I10 Essential (primary) hypertension: Secondary | ICD-10-CM

## 2016-08-18 DIAGNOSIS — E119 Type 2 diabetes mellitus without complications: Secondary | ICD-10-CM

## 2016-08-18 DIAGNOSIS — D519 Vitamin B12 deficiency anemia, unspecified: Secondary | ICD-10-CM | POA: Diagnosis not present

## 2016-08-18 DIAGNOSIS — R519 Headache, unspecified: Secondary | ICD-10-CM | POA: Insufficient documentation

## 2016-08-18 DIAGNOSIS — G8929 Other chronic pain: Secondary | ICD-10-CM

## 2016-08-18 LAB — POCT GLYCOSYLATED HEMOGLOBIN (HGB A1C): Hemoglobin A1C: 8.1

## 2016-08-18 MED ORDER — TRAMADOL HCL 50 MG PO TABS: 50.0000 mg | ORAL_TABLET | Freq: Four times a day (QID) | ORAL | 0 refills | Status: DC | PRN

## 2016-08-18 NOTE — Progress Notes (Signed)
Patient: Candace [name] Female    DOB: 17-Dec-1950   66 y.o.   MRN: [id number] Visit Date: 08/18/2016  Today's Provider: [name], MD   Chief Complaint  Patient presents with  . Follow-up   Subjective:    HPI Patient comes in today for a follow up. She was seen in the office 1 week ago c/o headache, chest pain, and fatigue. Labs indicated that she was slightly anemic, but otherwise normal. Her EKG in the office was also unchanged. Patient reports that she still has issues with fatigue, and her headaches has unchanged. She is also requesting a refill on her Tramadol to help with the pain.  Patient is accompanied with her daughter, and they both have questions about her insulin. She is currently taking 8 units daily.  Headaches better but persistant.    Allergies  Allergen Reactions  . Penicillins Swelling, Rash and Other (See Comments)    Has patient had a PCN reaction causing immediate rash, facial/tongue/throat swelling, SOB or lightheadedness with hypotension: Yes Has patient had a PCN reaction causing severe rash involving mucus membranes or skin necrosis: Yes Has patient had a PCN reaction that required hospitalization No Has patient had a PCN reaction occurring within the last 10 years: Yes If all of the above answers are "NO", then may proceed with Cephalosporin use.      Current Outpatient Prescriptions:  .  acetaminophen (TYLENOL) 500 MG tablet, Take 1,000 mg by mouth every 6 (six) hours as needed., Disp: , Rfl:  .  alendronate (FOSAMAX) 70 MG tablet, Take 70 mg by mouth once a week. Take with a full glass of water on an empty stomach., Disp: , Rfl:  .  aspirin 81 MG chewable tablet, Chew 1 tablet (81 mg total) by mouth daily., Disp: 30 tablet, Rfl: 1 .  calcium carbonate (OS-CAL - DOSED IN MG OF ELEMENTAL CALCIUM) 1250 (500 Ca) MG tablet, Take 1 tablet by mouth daily with breakfast., Disp: , Rfl:  .  cetirizine (ZYRTEC) 10 MG tablet, Take 10 mg by mouth  daily., Disp: , Rfl:  .  clopidogrel (PLAVIX) 75 MG tablet, Take 1 tablet (75 mg total) by mouth daily with breakfast., Disp: 30 tablet, Rfl: 1 .  docusate sodium (COLACE) 100 MG capsule, Take 100 mg by mouth daily as needed for mild constipation., Disp: , Rfl:  .  insulin glargine (LANTUS) 100 UNIT/ML injection, Inject 8 Units into the skin daily. , Disp: , Rfl:  .  isosorbide mononitrate (IMDUR) 30 MG 24 hr tablet, Take 1 tablet (30 mg total) by mouth daily. (Patient taking differently: Take 30 mg by mouth 2 (two) times daily. ), Disp: 30 tablet, Rfl: 1 .  metFORMIN (GLUCOPHAGE-XR) 750 MG 24 hr tablet, Take 1 tablet (750 mg total) by mouth 2 (two) times daily., Disp: 60 tablet, Rfl: 1 .  metoprolol (LOPRESSOR) 50 MG tablet, Take 50 mg by mouth 2 (two) times daily. , Disp: , Rfl:  .  nitroGLYCERIN (NITROSTAT) 0.4 MG SL tablet, Place 1 tablet (0.4 mg total) under the tongue every 5 (five) minutes as needed for chest pain., Disp: 30 tablet, Rfl: 0 .  ondansetron (ZOFRAN ODT) 8 MG disintegrating tablet, Take 1 tablet (8 mg total) by mouth every 8 (eight) hours as needed for nausea or vomiting., Disp: 20 tablet, Rfl: 0 .  Oxcarbazepine (TRILEPTAL) 300 MG tablet, Take 1 tablet (300 mg total) by mouth 2 (two) times daily., Disp: 60 tablet,  Rfl: 1 .  pantoprazole (PROTONIX) 40 MG tablet, Take 40 mg by mouth daily., Disp: , Rfl:  .  rosuvastatin (CRESTOR) 40 MG tablet, Take 1 tablet (40 mg total) by mouth daily at 6 PM., Disp: 30 tablet, Rfl: 1 .  traMADol (ULTRAM) 50 MG tablet, Take 50 mg by mouth every 6 (six) hours as needed., Disp: , Rfl:  .  vitamin B-12 (CYANOCOBALAMIN) 1000 MCG tablet, Take 1,000 mcg by mouth daily., Disp: , Rfl:   Review of Systems  Constitutional: Positive for activity change and fatigue.  Eyes: Negative.   Respiratory: Positive for shortness of breath.   Cardiovascular: Positive for palpitations. Negative for chest pain and leg swelling.  Gastrointestinal: Positive for nausea.   Endocrine: Negative.   Musculoskeletal: Positive for myalgias.  Allergic/Immunologic: Negative.   Neurological: Positive for headaches.  Hematological: Negative.   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    Social History  Substance Use Topics  . Smoking status: Former Smoker    Packs/day: 0.50    Types: Cigarettes    Quit date: 10/07/2001  . Smokeless tobacco: Never Used  . Alcohol use No   Objective:   BP 112/62 (BP Location: Right Arm, Patient Position: Sitting, Cuff Size: Normal)   Pulse 94   Temp 98.1 F (36.7 C)   Resp 20   Wt 158 lb (71.7 kg)   SpO2 98%   BMI 27.99 kg/m  Vitals:   08/18/16 1355  BP: 112/62  Pulse: 94  Resp: 20  Temp: 98.1 F (36.7 C)  SpO2: 98%  Weight: 158 lb (71.7 kg)     Physical Exam  Constitutional: She is oriented to person, place, and time. She appears well-developed and well-nourished.  HENT:  Head: Normocephalic and atraumatic.  Right Ear: External ear normal.  Left Ear: External ear normal.  Nose: Nose normal.  Eyes: Conjunctivae are normal. No scleral icterus.  Neck: No thyromegaly present.  Cardiovascular: Normal rate, regular rhythm and normal heart sounds.   Pulmonary/Chest: Effort normal and breath sounds normal.  Musculoskeletal: She exhibits no edema.  Neurological: She is alert and oriented to person, place, and time.  Skin: Skin is warm and dry.  Psychiatric: She has a normal mood and affect. Her behavior is normal. Judgment and thought content normal.        Assessment & Plan:     1. Essential (primary) hypertension Stable.   2. Type 2 diabetes mellitus without complication, with long-term current use of insulin (HCC) Stable. HgbA1c 8.1 today. Increase lantus to 10 units daily for better control.  3. Chronic nonintractable headache, unspecified headache type Refilled as below.  - traMADol (ULTRAM) 50 MG tablet; Take 1 tablet (50 mg total) by mouth every 6 (six) hours as  needed.  Dispense: 120 tablet; Refill: 0  4. Anemia due to vitamin B12 deficiency, unspecified B12 deficiency type Strongly recommend B12 injection on next visit.  5.s/p CABG Followed by cardiology.      I have done the exam and reviewed the above chart and it is accurate to the best of my knowledge. Development worker, community has been used in this note in any air is in the dictation or transcription are unintentional.  [name], MD  [place]

## 2016-08-18 NOTE — Patient Instructions (Signed)
Increase insulin to 10 units daily.

## 2016-09-09 ENCOUNTER — Ambulatory Visit: Payer: Medicare HMO | Admitting: Neurology

## 2016-09-13 ENCOUNTER — Encounter: Payer: Self-pay | Admitting: Family Medicine

## 2016-09-13 ENCOUNTER — Ambulatory Visit (INDEPENDENT_AMBULATORY_CARE_PROVIDER_SITE_OTHER): Payer: Medicare Other | Admitting: Family Medicine

## 2016-09-13 VITALS — BP 136/86 | HR 80 | Temp 97.9°F | Resp 16 | Wt 159.0 lb

## 2016-09-13 DIAGNOSIS — R0600 Dyspnea, unspecified: Secondary | ICD-10-CM

## 2016-09-13 DIAGNOSIS — Z794 Long term (current) use of insulin: Secondary | ICD-10-CM

## 2016-09-13 DIAGNOSIS — I10 Essential (primary) hypertension: Secondary | ICD-10-CM | POA: Diagnosis not present

## 2016-09-13 DIAGNOSIS — G8929 Other chronic pain: Secondary | ICD-10-CM

## 2016-09-13 DIAGNOSIS — R0609 Other forms of dyspnea: Secondary | ICD-10-CM

## 2016-09-13 DIAGNOSIS — R519 Headache, unspecified: Secondary | ICD-10-CM

## 2016-09-13 DIAGNOSIS — E119 Type 2 diabetes mellitus without complications: Secondary | ICD-10-CM | POA: Diagnosis not present

## 2016-09-13 DIAGNOSIS — E538 Deficiency of other specified B group vitamins: Secondary | ICD-10-CM | POA: Diagnosis not present

## 2016-09-13 DIAGNOSIS — G500 Trigeminal neuralgia: Secondary | ICD-10-CM

## 2016-09-13 DIAGNOSIS — R51 Headache: Secondary | ICD-10-CM

## 2016-09-13 DIAGNOSIS — I200 Unstable angina: Secondary | ICD-10-CM

## 2016-09-13 DIAGNOSIS — M26609 Unspecified temporomandibular joint disorder, unspecified side: Secondary | ICD-10-CM

## 2016-09-13 MED ORDER — PREDNISONE 10 MG PO TABS: 10.0000 mg | ORAL_TABLET | Freq: Every day | ORAL | 0 refills | Status: DC

## 2016-09-13 MED ORDER — CYANOCOBALAMIN 1000 MCG/ML IJ SOLN
1000.0000 ug | Freq: Once | INTRAMUSCULAR | Status: AC
  Administered 2016-09-13: 1000 ug via INTRAMUSCULAR

## 2016-09-13 NOTE — Progress Notes (Signed)
Patient: Candace [name] Female    DOB: [birth year]   66 y.o.   MRN: [id number] Visit Date: 09/13/2016  Today's Provider: [name], MD   Chief Complaint  Patient presents with  . Follow-up   Subjective:    HPI   Pt is here for a 4 week follow up. Her Lantus was increased to 10 units daily. She reports that her blood sugars have been running better. This morning it was 160.  She has missed a few doses the last few days. She is still having the chest pain and shortness of breath that she has been having but the shortness of breath seems worse, on and off.  She is still feeling very weak.      Follow up for Headaches  The patient was last seen for this 4 weeks ago. Changes made at last visit include refilling Tramadol. She is still having the headaches. She is seeing neurology.     Allergies  Allergen Reactions  . Penicillins Swelling, Rash and Other (See Comments)    Has patient had a PCN reaction causing immediate rash, facial/tongue/throat swelling, SOB or lightheadedness with hypotension: Yes Has patient had a PCN reaction causing severe rash involving mucus membranes or skin necrosis: Yes Has patient had a PCN reaction that required hospitalization No Has patient had a PCN reaction occurring within the last 10 years: Yes If all of the above answers are "NO", then may proceed with Cephalosporin use.      Current Outpatient Prescriptions:  .  acetaminophen (TYLENOL) 500 MG tablet, Take 1,000 mg by mouth every 6 (six) hours as needed., Disp: , Rfl:  .  alendronate (FOSAMAX) 70 MG tablet, Take 70 mg by mouth once a week. Take with a full glass of water on an empty stomach., Disp: , Rfl:  .  aspirin 81 MG chewable tablet, Chew 1 tablet (81 mg total) by mouth daily., Disp: 30 tablet, Rfl: 1 .  calcium carbonate (OS-CAL - DOSED IN MG OF ELEMENTAL CALCIUM) 1250 (500 Ca) MG tablet, Take 1 tablet by mouth daily with breakfast., Disp: , Rfl:  .  cetirizine (ZYRTEC)  10 MG tablet, Take 10 mg by mouth daily., Disp: , Rfl:  .  clopidogrel (PLAVIX) 75 MG tablet, Take 1 tablet (75 mg total) by mouth daily with breakfast., Disp: 30 tablet, Rfl: 1 .  docusate sodium (COLACE) 100 MG capsule, Take 100 mg by mouth daily as needed for mild constipation., Disp: , Rfl:  .  insulin glargine (LANTUS) 100 UNIT/ML injection, Inject 10 Units into the skin daily., Disp: , Rfl:  .  isosorbide mononitrate (IMDUR) 30 MG 24 hr tablet, Take 1 tablet (30 mg total) by mouth daily. (Patient taking differently: Take 30 mg by mouth 2 (two) times daily. ), Disp: 30 tablet, Rfl: 1 .  metFORMIN (GLUCOPHAGE-XR) 750 MG 24 hr tablet, Take 1 tablet (750 mg total) by mouth 2 (two) times daily., Disp: 60 tablet, Rfl: 1 .  metoprolol (LOPRESSOR) 50 MG tablet, Take 50 mg by mouth 2 (two) times daily. , Disp: , Rfl:  .  nitroGLYCERIN (NITROSTAT) 0.4 MG SL tablet, Place 1 tablet (0.4 mg total) under the tongue every 5 (five) minutes as needed for chest pain., Disp: 30 tablet, Rfl: 0 .  ondansetron (ZOFRAN ODT) 8 MG disintegrating tablet, Take 1 tablet (8 mg total) by mouth every 8 (eight) hours as needed for nausea or vomiting., Disp: 20 tablet, Rfl: 0 .  Oxcarbazepine (TRILEPTAL) 300 MG tablet, Take 1 tablet (300 mg total) by mouth 2 (two) times daily., Disp: 60 tablet, Rfl: 1 .  pantoprazole (PROTONIX) 40 MG tablet, Take 40 mg by mouth daily., Disp: , Rfl:  .  rosuvastatin (CRESTOR) 40 MG tablet, Take 1 tablet (40 mg total) by mouth daily at 6 PM., Disp: 30 tablet, Rfl: 1 .  traMADol (ULTRAM) 50 MG tablet, Take 1 tablet (50 mg total) by mouth every 6 (six) hours as needed., Disp: 120 tablet, Rfl: 0 .  vitamin B-12 (CYANOCOBALAMIN) 1000 MCG tablet, Take 1,000 mcg by mouth daily., Disp: , Rfl:   Review of Systems  Constitutional: Negative.   HENT: Negative.   Eyes: Negative.   Respiratory: Positive for shortness of breath.   Cardiovascular: Positive for chest pain.  Gastrointestinal: Negative.     Endocrine: Negative.   Genitourinary: Negative.   Musculoskeletal: Negative.   Skin: Negative.   Allergic/Immunologic: Negative.   Neurological: Positive for headaches.  Hematological: Negative.   Psychiatric/Behavioral: Negative.     Social History  Substance Use Topics  . Smoking status: Former Smoker    Packs/day: 0.50    Types: Cigarettes    Quit date: 10/07/2001  . Smokeless tobacco: Never Used  . Alcohol use No   Objective:   BP 136/86 (BP Location: Left Arm, Patient Position: Sitting, Cuff Size: Normal)   Pulse 80   Temp 97.9 F (36.6 C) (Oral)   Resp 16   Wt 159 lb (72.1 kg)   SpO2 97%   BMI 28.17 kg/m  Vitals:   09/13/16 1107  BP: 136/86  Pulse: 80  Resp: 16  Temp: 97.9 F (36.6 C)  TempSrc: Oral  SpO2: 97%  Weight: 159 lb (72.1 kg)     Physical Exam  Constitutional: She is oriented to person, place, and time. She appears well-developed and well-nourished.  HENT:  Head: Normocephalic and atraumatic.  Eyes: Conjunctivae and EOM are normal. Pupils are equal, round, and reactive to light.  Neck: Normal range of motion. Neck supple.  Cardiovascular: Normal rate, regular rhythm, normal heart sounds and intact distal pulses.   Pulmonary/Chest: Effort normal and breath sounds normal.  Abdominal: Soft.  Musculoskeletal: Normal range of motion.  Neurological: She is alert and oriented to person, place, and time. She has normal reflexes.  Skin: Skin is warm and dry.  Psychiatric: She has a normal mood and affect. Her behavior is normal. Judgment and thought content normal.        Assessment & Plan:          1. Essential (primary) hypertension   2. Type 2 diabetes mellitus without complication, with long-term current use of insulin (HCC) Improving. Continue same dose of lantus.   3. Chronic nonintractable headache, unspecified headache type  - Ambulatory referral to Social Work  4. Dyspnea on exertion  - EKG 12-Lead  5. Trigeminal  neuralgia   6. Unstable angina ([place])  - Ambulatory referral to Social Work  7. Vitamin B 12 deficiency First of 4 weekly injections given today then monthly.   - cyanocobalamin ((VITAMIN B-12)) injection 1,000 mcg; Inject 1 mL (1,000 mcg total) into the muscle once.   8. TMJ (temporomandibular joint disorder) Possible right TMJ treat with prednisone. Follow up in 1 week.   HPI, Exam, and A&P Transcribed under the direction and in the presence of Keaja Reaume L. Cranford Mon, MD  Electronically Signed: Katina Dung, CMA I have done the exam and reviewed the above chart and  it is accurate to the best of my knowledge. Development worker, community has been used in this note in any air is in the dictation or transcription are unintentional.   [name], MD  [place]

## 2016-09-13 NOTE — Patient Instructions (Addendum)
You are not strong enough yet to stay alone, but keep working hard at it. B12 injections once a week for 4 weeks then monthly in office. First one given today.  Will get next one at visit next week. Sent in prednisone for possible TMJ. Take as directed.

## 2016-09-20 ENCOUNTER — Encounter: Payer: Self-pay | Admitting: Family Medicine

## 2016-09-20 ENCOUNTER — Ambulatory Visit (INDEPENDENT_AMBULATORY_CARE_PROVIDER_SITE_OTHER): Payer: Medicare Other | Admitting: Family Medicine

## 2016-09-20 VITALS — BP 122/60 | HR 72 | Temp 98.0°F | Resp 16 | Wt 161.0 lb

## 2016-09-20 DIAGNOSIS — Z794 Long term (current) use of insulin: Secondary | ICD-10-CM | POA: Diagnosis not present

## 2016-09-20 DIAGNOSIS — E119 Type 2 diabetes mellitus without complications: Secondary | ICD-10-CM

## 2016-09-20 DIAGNOSIS — R51 Headache: Secondary | ICD-10-CM

## 2016-09-20 DIAGNOSIS — I10 Essential (primary) hypertension: Secondary | ICD-10-CM | POA: Diagnosis not present

## 2016-09-20 DIAGNOSIS — E538 Deficiency of other specified B group vitamins: Secondary | ICD-10-CM

## 2016-09-20 DIAGNOSIS — G8929 Other chronic pain: Secondary | ICD-10-CM

## 2016-09-20 DIAGNOSIS — R519 Headache, unspecified: Secondary | ICD-10-CM

## 2016-09-20 MED ORDER — CYANOCOBALAMIN 1000 MCG/ML IJ SOLN
1000.0000 ug | Freq: Once | INTRAMUSCULAR | Status: AC
  Administered 2016-09-20: 1000 ug via INTRAMUSCULAR

## 2016-09-20 MED ORDER — DIAZEPAM 2 MG PO TABS: 2.0000 mg | ORAL_TABLET | Freq: Three times a day (TID) | ORAL | 4 refills | Status: DC | PRN

## 2016-09-20 NOTE — Patient Instructions (Addendum)
Stop Tramadol, try the new medication from neurologist. Try Diazepam as directed.

## 2016-09-20 NOTE — Progress Notes (Signed)
Subjective:  HPI Pt is here for a 1 week follow up of TMJ and Vitamin B 12 deficiency. She is getting her 2nd of 4 weekly injection, then will go to monthly injections. Pt is not feeling any better. If anything her headaches have been more intense. She had a bad day yesterday. She cried because her head was hurting so bad. Pt is tired of hurting and tired of trying to figure out what is going on.  Pt tearful during visit today.  Prior to Admission medications   Medication Sig Start Date End Date Taking? Authorizing Provider  acetaminophen (TYLENOL) 500 MG tablet Take 1,000 mg by mouth every 6 (six) hours as needed.    [provider]  alendronate (FOSAMAX) 70 MG tablet Take 70 mg by mouth once a week. Take with a full glass of water on an empty stomach.    [provider]  aspirin 81 MG chewable tablet Chew 1 tablet (81 mg total) by mouth daily. 08/06/16   Fritzi Mandes, MD  calcium carbonate (OS-CAL - DOSED IN MG OF ELEMENTAL CALCIUM) 1250 (500 Ca) MG tablet Take 1 tablet by mouth daily with breakfast.    [provider]  cetirizine (ZYRTEC) 10 MG tablet Take 10 mg by mouth daily.    [provider]  clopidogrel (PLAVIX) 75 MG tablet Take 1 tablet (75 mg total) by mouth daily with breakfast. 08/06/16   Fritzi Mandes, MD  docusate sodium (COLACE) 100 MG capsule Take 100 mg by mouth daily as needed for mild constipation.    [provider]  insulin glargine (LANTUS) 100 UNIT/ML injection Inject 10 Units into the skin daily.    [provider]  isosorbide mononitrate (IMDUR) 30 MG 24 hr tablet Take 1 tablet (30 mg total) by mouth daily. Patient taking differently: Take 30 mg by mouth 2 (two) times daily.  08/06/16   Fritzi Mandes, MD  metFORMIN (GLUCOPHAGE-XR) 750 MG 24 hr tablet Take 1 tablet (750 mg total) by mouth 2 (two) times daily. 11/18/15   Jadene Pierini E, PA-C  metoprolol (LOPRESSOR) 50 MG tablet Take 50 mg by mouth 2 (two) times daily.      [provider]  nitroGLYCERIN (NITROSTAT) 0.4 MG SL tablet Place 1 tablet (0.4 mg total) under the tongue every 5 (five) minutes as needed for chest pain. 07/27/16   Bettey Costa, MD  ondansetron (ZOFRAN ODT) 8 MG disintegrating tablet Take 1 tablet (8 mg total) by mouth every 8 (eight) hours as needed for nausea or vomiting. 06/22/16   Pieter Partridge, DO  Oxcarbazepine (TRILEPTAL) 300 MG tablet Take 1 tablet (300 mg total) by mouth 2 (two) times daily. 07/28/16   [name]., MD  pantoprazole (PROTONIX) 40 MG tablet Take 40 mg by mouth daily.    [provider]  predniSONE (DELTASONE) 10 MG tablet Take 1 tablet (10 mg total) by mouth daily with breakfast. Take 6 pills on day 1 then decrease by one until finished. 09/13/16   [name]., MD  rosuvastatin (CRESTOR) 40 MG tablet Take 1 tablet (40 mg total) by mouth daily at 6 PM. 11/18/15   Gold, Wilder Glade, PA-C  traMADol (ULTRAM) 50 MG tablet Take 1 tablet (50 mg total) by mouth every 6 (six) hours as needed. 08/18/16   [name]., MD  vitamin B-12 (CYANOCOBALAMIN) 1000 MCG tablet Take 1,000 mcg by mouth daily.    [provider]  Patient Active Problem List   Diagnosis Date Noted  . Headache 08/18/2016  . Abnormal stress test 08/05/2016  . Unstable angina ([place]) 08/03/2016  . Diabetes ([place]) 08/03/2016  . Snoring 02/13/2016  . Bradycardia 12/31/2015  . Asthma 11/11/2015  . S/P CABG x 4 11/10/2015  . Coronary artery disease 11/07/2015  . Benign essential HTN 10/28/2014  . Episode of syncope 10/25/2014  . Carotid artery narrowing 02/08/2014  . Chest pain 02/08/2014  . Mixed hyperlipidemia 02/08/2014  . Atypical chest pain 02/07/2014  . Arteriosclerosis of coronary artery 02/07/2014  . Essential (primary) hypertension 02/07/2014  . Awareness of heartbeats 02/07/2014  . Abdominal pain 10/05/2013  . D (diarrhea) 10/05/2013  . Acid reflux 10/05/2013    Past Medical History:  Diagnosis  Date  . Allergy   . Anemia   . Anxiety   . Arrhythmia   . Arthritis   . Coronary artery disease   . Depression   . Diabetes mellitus without complication ([place])   . Dyspnea    doe  . Dysrhythmia   . GERD (gastroesophageal reflux disease)   . Headache   . Hyperlipidemia   . Hypertension   . Myocardial infarction ([place])    2016  . Myocardial infarction with cardiac rehabilitation [facility])    MI 2016/ CABG 8/17    FINISHED CARDIAC REHAB 3 WEEKS AGO  . Panic attack   . Reflux   . Stroke ([place])   . Voice tremor     Social History   Social History  . Marital status: Divorced    Spouse name: N/A  . Number of children: N/A  . Years of education: N/A   Occupational History  . Not on file.   Social History Main Topics  . Smoking status: Former Smoker    Packs/day: 0.50    Types: Cigarettes    Quit date: 10/07/2001  . Smokeless tobacco: Never Used  . Alcohol use No  . Drug use: No  . Sexual activity: Not Currently   Other Topics Concern  . Not on file   Social History Narrative  . No narrative on file    Allergies  Allergen Reactions  . Penicillins Swelling, Rash and Other (See Comments)    Has patient had a PCN reaction causing immediate rash, facial/tongue/throat swelling, SOB or lightheadedness with hypotension: Yes Has patient had a PCN reaction causing severe rash involving mucus membranes or skin necrosis: Yes Has patient had a PCN reaction that required hospitalization No Has patient had a PCN reaction occurring within the last 10 years: Yes If all of the above answers are "NO", then may proceed with Cephalosporin use.     Review of Systems  Constitutional: Positive for malaise/fatigue.  HENT: Negative.   Eyes: Negative.   Respiratory: Negative.   Cardiovascular: Positive for chest pain.  Gastrointestinal: Negative.   Genitourinary: Negative.   Musculoskeletal: Negative.   Skin: Negative.   Neurological: Positive for headaches.  Endo/Heme/Allergies:  Negative.   Psychiatric/Behavioral: Negative.        Pt tearful during visit.     Immunization History  Administered Date(s) Administered  . Influenza,inj,Quad PF,36+ Mos 12/19/2015    Objective:  BP 122/60 (BP Location: Left Arm, Patient Position: Sitting, Cuff Size: Large)   Pulse 72   Temp 98 F (36.7 C) (Oral)   Resp 16   Wt 161 lb (73 kg)   BMI 28.52 kg/m   Physical Exam  Constitutional: She is oriented to person, place, and time and  well-developed, well-nourished, and in no distress.  HENT:  Head: Normocephalic and atraumatic.  Right Ear: External ear normal.  Left Ear: External ear normal.  Nose: Nose normal.  Eyes: Conjunctivae and EOM are normal. Pupils are equal, round, and reactive to light. No scleral icterus.  Neck: Normal range of motion. Neck supple. No thyromegaly present.  Cardiovascular: Normal rate, regular rhythm, normal heart sounds and intact distal pulses.   Pulmonary/Chest: Effort normal and breath sounds normal.  Abdominal: Soft.  Musculoskeletal: Normal range of motion.  Neurological: She is alert and oriented to person, place, and time. She has normal reflexes. Gait normal. GCS score is 15.  Skin: Skin is warm and dry.  Psychiatric: Mood, memory, affect and judgment normal.  Pt tearful during visit.     Lab Results  Component Value Date   WBC 7.3 08/13/2016   HGB 10.3 (L) 08/13/2016   HCT 32.6 (L) 08/13/2016   PLT 305 08/13/2016   GLUCOSE 106 (H) 08/13/2016   CHOL 214 (H) 07/14/2013   TRIG 118 07/14/2013   HDL 36 (L) 07/14/2013   LDLCALC 154 (H) 07/14/2013   TSH 1.40 04/19/2016   INR 1.44 11/10/2015   HGBA1C 8.1 08/18/2016    CMP     Component Value Date/Time   NA 139 08/13/2016 1233   NA 137 04/29/2014 2042   K 4.8 08/13/2016 1233   K 3.5 04/29/2014 2042   CL 99 08/13/2016 1233   CL 103 04/29/2014 2042   CO2 23 08/13/2016 1233   CO2 24 04/29/2014 2042   GLUCOSE 106 (H) 08/13/2016 1233   GLUCOSE 128 (H) 08/06/2016 0449    GLUCOSE 249 (H) 04/29/2014 2042   BUN 14 08/13/2016 1233   BUN 10 04/29/2014 2042   CREATININE 0.68 08/13/2016 1233   CREATININE 0.78 04/29/2014 2042   CALCIUM 9.3 08/13/2016 1233   CALCIUM 9.8 04/29/2014 2042   PROT 6.1 08/13/2016 1233   PROT 7.6 04/29/2014 2042   ALBUMIN 3.7 08/13/2016 1233   ALBUMIN 4.0 04/29/2014 2042   AST 14 08/13/2016 1233   AST 18 04/29/2014 2042   ALT 12 08/13/2016 1233   ALT 22 04/29/2014 2042   ALKPHOS 98 08/13/2016 1233   ALKPHOS 112 04/29/2014 2042   BILITOT <0.2 08/13/2016 1233   BILITOT 0.2 04/29/2014 2042   GFRNONAA 92 08/13/2016 1233   GFRNONAA >60 04/29/2014 2042   GFRNONAA >60 09/16/2013 0523   GFRAA 106 08/13/2016 1233   GFRAA >60 04/29/2014 2042   GFRAA >60 09/16/2013 0523    Assessment and Plan :  1. Vitamin B 12 deficiency 2nd out of 4 injections.  - cyanocobalamin ((VITAMIN B-12)) injection 1,000 mcg; Inject 1 mL (1,000 mcg total) into the muscle once.  2. Chronic nonintractable headache, unspecified headache type Stop Tramadol, Start New medication from Neurologist and try Diazepam.   3. Type 2 diabetes mellitus without complication, with long-term current use of insulin ([place])   4. Essential (primary) hypertension 5.CAD/s/p CABG 6.Afib 7.Chronic Fatigue  HPI, Exam, and A&P Transcribed under the direction and in the presence of Adalee Kathan L. Cranford Mon, MD  Electronically Signed: Katina Dung, [place] MD [facility] 09/20/2016 5:03 PM

## 2016-09-21 ENCOUNTER — Telehealth: Payer: Self-pay

## 2016-09-21 NOTE — Telephone Encounter (Signed)
Patient's daughter called wanting to speak with Ana about what's been going on with the patient. Please call back at [phone]. Thanks!

## 2016-09-22 NOTE — Telephone Encounter (Signed)
Take the tramadol for the pain.

## 2016-09-22 NOTE — Telephone Encounter (Signed)
Advised patient that we will continue with the Tramadol for now until she is able to see the Neurologist in August.

## 2016-09-22 NOTE — Telephone Encounter (Signed)
Spoke with daughter and she reports that the patient's BP was 182/100 yesterday evening. She states that she called the cardiologist, and they recommended not to adjust medications because they feel the elevated BP is due to her headaches. She reports that she gave her a diazepam around 5pm and it took the edge off. She reports that she checked her BP right before bed and it was still elevated (170/90s).   She states that you were trying to wean her off of the Tramadol, but the tramadol is the only medication that seems to help. After taking tramadol around 9:30-10pm her BP had dropped to 156/90 HR was 100 and her migraine was a lot better. She does not have her follow up appt with neurology until 11/04/2016. Can the patient continue taking the Tramadol until then? Any other recommendations to help with her migraines? Please advise. Thanks!

## 2016-09-23 ENCOUNTER — Telehealth: Payer: Self-pay | Admitting: Family Medicine

## 2016-09-23 NOTE — Telephone Encounter (Signed)
yes

## 2016-09-23 NOTE — Telephone Encounter (Signed)
Is this ok?-aa 

## 2016-09-23 NOTE — Telephone Encounter (Signed)
pt'sd mom called saying pt needs refill on her   isosorbide mononitrate (IMDUR) 30 MG 24 hr tablet  Taking 08/06/16 -- Fritzi Mandes, MD    Take 1 tablet (30 mg total) by mouth daily.   Mom says it need to be for twice a day.  They use [facility]  Thanks teri

## 2016-09-24 MED ORDER — ISOSORBIDE MONONITRATE ER 30 MG PO TB24: 30.0000 mg | ORAL_TABLET | Freq: Two times a day (BID) | ORAL | 12 refills | Status: DC

## 2016-09-24 NOTE — Telephone Encounter (Signed)
rx refilled as requested below-aa

## 2016-09-30 ENCOUNTER — Encounter: Payer: Self-pay | Admitting: Family Medicine

## 2016-09-30 ENCOUNTER — Ambulatory Visit (INDEPENDENT_AMBULATORY_CARE_PROVIDER_SITE_OTHER): Payer: Medicare Other | Admitting: Family Medicine

## 2016-09-30 VITALS — BP 122/78 | HR 80 | Resp 16 | Ht 63.0 in | Wt 158.0 lb

## 2016-09-30 DIAGNOSIS — E538 Deficiency of other specified B group vitamins: Secondary | ICD-10-CM

## 2016-09-30 DIAGNOSIS — R112 Nausea with vomiting, unspecified: Secondary | ICD-10-CM

## 2016-09-30 DIAGNOSIS — G4733 Obstructive sleep apnea (adult) (pediatric): Secondary | ICD-10-CM | POA: Insufficient documentation

## 2016-09-30 DIAGNOSIS — R5382 Chronic fatigue, unspecified: Secondary | ICD-10-CM

## 2016-09-30 DIAGNOSIS — G44229 Chronic tension-type headache, not intractable: Secondary | ICD-10-CM | POA: Diagnosis not present

## 2016-09-30 DIAGNOSIS — I25708 Atherosclerosis of coronary artery bypass graft(s), unspecified, with other forms of angina pectoris: Secondary | ICD-10-CM

## 2016-09-30 DIAGNOSIS — E782 Mixed hyperlipidemia: Secondary | ICD-10-CM

## 2016-09-30 DIAGNOSIS — E559 Vitamin D deficiency, unspecified: Secondary | ICD-10-CM | POA: Diagnosis not present

## 2016-09-30 DIAGNOSIS — D649 Anemia, unspecified: Secondary | ICD-10-CM | POA: Diagnosis not present

## 2016-09-30 DIAGNOSIS — Z794 Long term (current) use of insulin: Secondary | ICD-10-CM

## 2016-09-30 DIAGNOSIS — I480 Paroxysmal atrial fibrillation: Secondary | ICD-10-CM | POA: Diagnosis not present

## 2016-09-30 DIAGNOSIS — F418 Other specified anxiety disorders: Secondary | ICD-10-CM | POA: Diagnosis not present

## 2016-09-30 DIAGNOSIS — E119 Type 2 diabetes mellitus without complications: Secondary | ICD-10-CM

## 2016-09-30 DIAGNOSIS — R259 Unspecified abnormal involuntary movements: Secondary | ICD-10-CM | POA: Diagnosis not present

## 2016-09-30 DIAGNOSIS — I10 Essential (primary) hypertension: Secondary | ICD-10-CM

## 2016-09-30 DIAGNOSIS — G252 Other specified forms of tremor: Secondary | ICD-10-CM

## 2016-09-30 DIAGNOSIS — R531 Weakness: Secondary | ICD-10-CM | POA: Insufficient documentation

## 2016-09-30 DIAGNOSIS — R251 Tremor, unspecified: Secondary | ICD-10-CM | POA: Insufficient documentation

## 2016-09-30 MED ORDER — DULOXETINE HCL 30 MG PO CPEP: 30.0000 mg | ORAL_CAPSULE | Freq: Every day | ORAL | 2 refills | Status: DC

## 2016-09-30 NOTE — Patient Instructions (Addendum)
Stop alendronate (Fosamax), Xyzal, and pantoprazole.  Decrease Tylenol ES to two tablets twice daily.  Begin Cymbalta 30 mg daily to help with mood and pain.  Try to limit tramadol and Valium use.  Do not start muscle relaxant yet.

## 2016-09-30 NOTE — Progress Notes (Addendum)
Date:  09/30/2016   Name:  Candace [name]   DOB:  1951-01-03   MRN:  [id number]  PCP:  Adline Potter, MD    Chief Complaint: Advice Only ([facility] ); Chest Pain (Pain in chest x 1 month. Had stent placed in June. Following Cardiology. Had Quad Bypass prior to stent placement. ); and Headache (Has had pain in head 1-3 months. Had CT and Biopsy done in January. Very tired and frustrated from doctors who can not figure this out. )   History of Present Illness:  This is a 66 y.o. female seen in initial geriatric consultation for Dr. Rosanna Randy at [facility]. Has multiple medical issues worse since CABG last year, has seen multiple doctors but not getting better. Chronic HAs felt TMJ on prednisone for months without benefit, saw [facility] neuro in May, felt tension/medication-induced, told to wean off tramadol (Valium started to help) then begin Zanaflex, has been able to wean tramadol from 6 to 4 daily but no further, taking Valium qod, never started Zanaflex, also taking Tylenol 1000 mg qid, neuro wanted to try Pamelor but hx "heart problems" with med though unclear if med was cause per daughter. On Zoloft for depression in past which did not help. PAF and CAD s/p CABG last year, followed by cards, stent placed last month, still with angina 2-3x/wk, NTG helps. T2DM since 2010 on orals until surgery, takes metformin XR and Lantus 10 units nightly, on Tradjenta and glipizide in past. Gets B12 shots at [facility], on Fosamax due top steroid use, told to stop vit D when started Fosamax per daughter. C/o chronic fatigue, anemic in May. C/o recurrent nausea, takes Zofran bid, occ vomiting with nosebleed, neg gastric emptying study in past, unsure why taking PPI, no recent heartburn, occ diarrhea. Hand shakes when writes. Off Trileptal, ineffective. Saw optho recently, told eyes ok. Persistent numbness L toes since surgery.  Review of Systems:  Review of Systems  Constitutional: Negative for chills and fever.  HENT: Negative for ear  pain, sinus pain and trouble swallowing.   Eyes: Negative for pain.  Respiratory: Negative for cough and wheezing.   Cardiovascular: Negative for leg swelling.  Gastrointestinal: Negative for abdominal pain.  Endocrine: Negative for polydipsia and polyuria.  Genitourinary: Negative for difficulty urinating and vaginal bleeding.  Musculoskeletal: Negative for joint swelling.  Neurological: Negative for syncope and light-headedness.  Hematological: Negative for adenopathy.    Patient Active Problem List   Diagnosis Date Noted  . Depression with anxiety 09/30/2016  . PAF (paroxysmal atrial fibrillation) ([place]) 09/30/2016  . Chronic fatigue 09/30/2016  . Anemia 09/30/2016  . Resting tremor 09/30/2016  . Nausea & vomiting 09/30/2016  . Mild obstructive sleep apnea 09/30/2016  . Headache 08/18/2016  . Abnormal stress test 08/05/2016  . Unstable angina ([place]) 08/03/2016  . Diabetes ([place]) 08/03/2016  . Snoring 02/13/2016  . Bradycardia 12/31/2015  . Asthma 11/11/2015  . S/P CABG x 4 11/10/2015  . Coronary artery disease 11/07/2015  . Benign essential HTN 10/28/2014  . Episode of syncope 10/25/2014  . Carotid artery narrowing 02/08/2014  . Chest pain 02/08/2014  . Mixed hyperlipidemia 02/08/2014  . Atypical chest pain 02/07/2014  . Arteriosclerosis of coronary artery 02/07/2014  . Essential (primary) hypertension 02/07/2014  . Awareness of heartbeats 02/07/2014  . Abdominal pain 10/05/2013  . D (diarrhea) 10/05/2013  . Acid reflux 10/05/2013    Prior to Admission medications   Medication Sig Start Date End Date Taking? Authorizing Provider  acetaminophen (TYLENOL) 500 MG  tablet Take 1,000 mg by mouth 2 (two) times daily.   Yes [provider]  aspirin 81 MG chewable tablet Chew 1 tablet (81 mg total) by mouth daily. 08/06/16  Yes Fritzi Mandes, MD  clopidogrel (PLAVIX) 75 MG tablet Take 1 tablet (75 mg total) by mouth daily with breakfast. 08/06/16  Yes Fritzi Mandes, MD   diazepam (VALIUM) 2 MG tablet Take 1 tablet (2 mg total) by mouth every 8 (eight) hours as needed for anxiety. 09/20/16  Yes [name]., MD  docusate sodium (COLACE) 100 MG capsule Take 100 mg by mouth daily as needed for mild constipation.   Yes [provider]  insulin glargine (LANTUS) 100 UNIT/ML injection Inject 10 Units into the skin daily.   Yes [provider]  isosorbide mononitrate (IMDUR) 30 MG 24 hr tablet Take 1 tablet (30 mg total) by mouth 2 (two) times daily. 09/24/16  Yes [name]., MD  metFORMIN (GLUCOPHAGE-XR) 750 MG 24 hr tablet Take 1 tablet (750 mg total) by mouth 2 (two) times daily. 11/18/15  Yes Gold, Wayne E, PA-C  metoprolol (LOPRESSOR) 50 MG tablet Take 50 mg by mouth 2 (two) times daily.    Yes [provider]  nitroGLYCERIN (NITROSTAT) 0.4 MG SL tablet Place 1 tablet (0.4 mg total) under the tongue every 5 (five) minutes as needed for chest pain. 07/27/16  Yes Bettey Costa, MD  ondansetron (ZOFRAN ODT) 8 MG disintegrating tablet Take 1 tablet (8 mg total) by mouth every 8 (eight) hours as needed for nausea or vomiting. 06/22/16  Yes Tomi Likens, Adam R, DO  rosuvastatin (CRESTOR) 40 MG tablet Take 1 tablet (40 mg total) by mouth daily at 6 PM. 11/18/15  Yes Gold, Wayne E, PA-C  traMADol (ULTRAM) 50 MG tablet Take 1 tablet (50 mg total) by mouth every 6 (six) hours as needed. 08/18/16  Yes [name]., MD  DULoxetine (CYMBALTA) 30 MG capsule Take 1 capsule (30 mg total) by mouth daily. 09/30/16   Adline Potter, MD    Allergies  Allergen Reactions  . Penicillins Swelling, Rash and Other (See Comments)    Has patient had a PCN reaction causing immediate rash, facial/tongue/throat swelling, SOB or lightheadedness with hypotension: Yes Has patient had a PCN reaction causing severe rash involving mucus membranes or skin necrosis: Yes Has patient had a PCN reaction that required hospitalization No Has patient had a PCN  reaction occurring within the last 10 years: Yes If all of the above answers are "NO", then may proceed with Cephalosporin use.     Past Surgical History:  Procedure Laterality Date  . APPENDECTOMY    . ARTERY BIOPSY Right 04/26/2016   Procedure: BIOPSY TEMPORAL ARTERY;  Surgeon: Margaretha Sheffield, MD;  Location: [facility] ORS;  Service: ENT;  Laterality: Right;  . CARDIAC CATHETERIZATION N/A 11/06/2015   Procedure: Left Heart Cath and Coronary Angiography;  Surgeon: Corey Skains, MD;  Location: [place] CV LAB;  Service: Cardiovascular;  Laterality: N/A;  . CESAREAN SECTION    . CORONARY ARTERY BYPASS GRAFT N/A 11/10/2015   Procedure: CORONARY ARTERY BYPASS GRAFTING (CABG), ON PUMP, TIMES FOUR, USING LEFT INTERNAL MAMMARY ARTERY, BILATERAL GREATER SAPHENOUS VEINS HARVESTED ENDOSCOPICALLY;  Surgeon: [name], MD;  Location: [place];  Service: Open Heart Surgery;  Laterality: N/A;  LIMA-LAD; SEQ SVG-OM1-OM2; SVG-PL  . CORONARY STENT INTERVENTION N/A 08/05/2016   Procedure: Coronary Stent Intervention;  Surgeon: Isaias Cowman, MD;  Location: [place] CV  LAB;  Service: Cardiovascular;  Laterality: N/A;  . LEFT HEART CATH AND CORONARY ANGIOGRAPHY N/A 08/05/2016   Procedure: Left Heart Cath and Coronary Angiography;  Surgeon: Isaias Cowman, MD;  Location: [place] CV LAB;  Service: Cardiovascular;  Laterality: N/A;  . TEE WITHOUT CARDIOVERSION N/A 11/10/2015   Procedure: TRANSESOPHAGEAL ECHOCARDIOGRAM (TEE);  Surgeon: [name], MD;  Location: [place];  Service: Open Heart Surgery;  Laterality: N/A;  . TUBAL LIGATION      Social History  Substance Use Topics  . Smoking status: Former Smoker    Packs/day: 0.50    Types: Cigarettes    Quit date: 10/07/2001  . Smokeless tobacco: Never Used  . Alcohol use No    Family History  Problem Relation Age of Onset  . Cancer Father   . Hypertension Father   . Heart disease Father   . Cancer Mother   . Hypertension  Mother   . Cancer Sister   . Breast cancer Sister 86  . Breast cancer Sister 77    Medication list has been reviewed and updated.  Physical Examination: BP 122/78   Pulse 80   Resp 16   Ht 5\' 3"  (1.6 m)   Wt 158 lb (71.7 kg)   SpO2 95%   BMI 27.99 kg/m   Physical Exam  Constitutional: She is oriented to person, place, and time. She appears well-developed and well-nourished.  HENT:  Head: Normocephalic and atraumatic.  Right Ear: External ear normal.  Left Ear: External ear normal.  Nose: Nose normal.  Mouth/Throat: Oropharynx is clear and moist.  TMs clear  Eyes: Conjunctivae and EOM are normal. Pupils are equal, round, and reactive to light. No scleral icterus.  Neck: Normal range of motion. Neck supple. No thyromegaly present.  Cardiovascular: Normal rate, regular rhythm, normal heart sounds and intact distal pulses.   Pulmonary/Chest: Effort normal and breath sounds normal.  Abdominal: Soft. She exhibits no distension and no mass. There is no tenderness.  Musculoskeletal: She exhibits no edema.  No trigger points noted  Lymphadenopathy:    She has no cervical adenopathy.  Neurological: She is alert and oriented to person, place, and time. Coordination normal.  Mild resting tremor L hand Romberg sl unsteady, gait sl wide-based (uses cane) SLUMS 29/30  Skin: Skin is warm and dry.  Psychiatric: She has a normal mood and affect. Her behavior is normal.  GDS 6/15  Nursing note and vitals reviewed.   Assessment and Plan:  1. Coronary artery disease of bypass graft of native heart with stable angina pectoris ([place]) Stable on metoprolol/Imdur/NTG, followed by cards  2. Type 2 diabetes mellitus without complication, with long-term current use of insulin ([facility]) Marginal control (a1c 8.1% in May), may be able to convert to orals alone given low insulin requirement off steroids - HgB A1c - TSH - Lipid Profile  3. PAF (paroxysmal atrial fibrillation) ([facility]) On DAPT,  followed by cards  4. Depression with anxiety Positive GDS, Zoloft ineffective in past, begin Cymbalta, limit tramadol/Valium use  5. Chronic tension-type headache, not intractable Likely medication induced per neuro, may improve on Cymbalta, consider low dose TCA if ineffective (unclear intolerance), encouraged limiting tramadol/Valium use, Zofran may be contributing  6. Essential (primary) hypertension Well controlled on metoprolol - Comprehensive Metabolic Panel (CMET) - CBC  7. Mixed hyperlipidemia Unclear control on Crestor, check lipids  8. Mild obstructive sleep apnea Per sleep study in Jan  9. Chronic fatigue Likely multifactorial, Cymbalta may help  10. Anemia,  unspecified type Normocytic, likely ACD  11. Resting tremor Likely benign essential tremor  12. Non-intractable vomiting with nausea, unspecified vomiting type Unclear etiology, consider Reglan but already on multiple serotonergic meds  13. Vitamin D deficiency 7.8 in Feb, off supplement - Vitamin D (25 hydroxy)  14. B12 deficiency Getting shots at [facility] - B12  15. Med review D/c Fosamax, Protonix, and Xyzal as unclear indications for use, decrease Tylenol to 1000 mg bid. Goal is to taper off tramadol, Valium, and Zofran.  Return in about 4 weeks (around 10/28/2016).   One hour spent with pt/family over half in counseling  Naim Murtha M. [facility]  09/30/2016  Addendum: Labs reviewed, will d/c Lantus and increase metformin to 1000 mg bid, begin vit D supp and stop B12 injections. Also hold Crestor given high dose and interaction with Plavix, consider Lipitor next visit. Also consider increasing metoprolol and stopping Imdur as can cause headaches.

## 2016-10-01 ENCOUNTER — Ambulatory Visit (INDEPENDENT_AMBULATORY_CARE_PROVIDER_SITE_OTHER): Payer: Medicare Other

## 2016-10-01 DIAGNOSIS — E538 Deficiency of other specified B group vitamins: Secondary | ICD-10-CM

## 2016-10-01 MED ORDER — CYANOCOBALAMIN 1000 MCG/ML IJ SOLN
1000.0000 ug | Freq: Once | INTRAMUSCULAR | Status: AC
  Administered 2016-10-01: 1000 ug via INTRAMUSCULAR

## 2016-10-02 LAB — LIPID PANEL
Chol/HDL Ratio: 3.9 ratio (ref 0.0–4.4)
Cholesterol, Total: 259 mg/dL — ABNORMAL HIGH (ref 100–199)
HDL: 66 mg/dL (ref >39)
LDL Calculated: 173 mg/dL — ABNORMAL HIGH (ref 0–99)
Triglycerides: 100 mg/dL (ref 0–149)
VLDL Cholesterol Cal: 20 mg/dL (ref 5–40)

## 2016-10-02 LAB — COMPREHENSIVE METABOLIC PANEL
ALT: 6 IU/L (ref 0–32)
AST: 13 IU/L (ref 0–40)
Albumin/Globulin Ratio: 1.6 (ref 1.2–2.2)
Albumin: 4 g/dL (ref 3.6–4.8)
Alkaline Phosphatase: 96 IU/L (ref 39–117)
BUN/Creatinine Ratio: 16 (ref 12–28)
BUN: 11 mg/dL (ref 8–27)
Bilirubin Total: <0.2 mg/dL (ref 0.0–1.2)
CO2: 24 mmol/L (ref 20–29)
Calcium: 9.5 mg/dL (ref 8.7–10.3)
Chloride: 100 mmol/L (ref 96–106)
Creatinine, Ser: 0.68 mg/dL (ref 0.57–1.00)
GFR calc Af Amer: 106 mL/min/{1.73_m2} (ref >59)
GFR calc non Af Amer: 92 mL/min/{1.73_m2} (ref >59)
Globulin, Total: 2.5 g/dL (ref 1.5–4.5)
Glucose: 113 mg/dL — ABNORMAL HIGH (ref 65–99)
Potassium: 4.3 mmol/L (ref 3.5–5.2)
Sodium: 141 mmol/L (ref 134–144)
Total Protein: 6.5 g/dL (ref 6.0–8.5)

## 2016-10-02 LAB — CBC
Hematocrit: 36.5 % (ref 34.0–46.6)
Hemoglobin: 11.5 g/dL (ref 11.1–15.9)
MCH: 30.3 pg (ref 26.6–33.0)
MCHC: 31.5 g/dL (ref 31.5–35.7)
MCV: 96 fL (ref 79–97)
Platelets: 263 10*3/uL (ref 150–379)
RBC: 3.79 x10E6/uL (ref 3.77–5.28)
RDW: 13.5 % (ref 12.3–15.4)
WBC: 5.6 10*3/uL (ref 3.4–10.8)

## 2016-10-02 LAB — HEMOGLOBIN A1C
Est. average glucose Bld gHb Est-mCnc: 151 mg/dL
Hgb A1c MFr Bld: 6.9 % — ABNORMAL HIGH (ref 4.8–5.6)

## 2016-10-02 LAB — VITAMIN D 25 HYDROXY (VIT D DEFICIENCY, FRACTURES): Vit D, 25-Hydroxy: 12.2 ng/mL — ABNORMAL LOW (ref 30.0–100.0)

## 2016-10-02 LAB — VITAMIN B12: Vitamin B-12: >2000 pg/mL — ABNORMAL HIGH (ref 232–1245)

## 2016-10-02 LAB — TSH: TSH: 0.818 u[IU]/mL (ref 0.450–4.500)

## 2016-10-04 ENCOUNTER — Other Ambulatory Visit: Payer: Self-pay | Admitting: Family Medicine

## 2016-10-04 MED ORDER — VITAMIN D 50 MCG (2000 UT) PO CAPS: 1.0000 | ORAL_CAPSULE | Freq: Every day | ORAL | Status: DC

## 2016-10-04 MED ORDER — METFORMIN HCL 1000 MG PO TABS: 1000.0000 mg | ORAL_TABLET | Freq: Two times a day (BID) | ORAL | 2 refills | Status: DC

## 2016-10-08 ENCOUNTER — Ambulatory Visit: Payer: Self-pay

## 2016-10-11 ENCOUNTER — Ambulatory Visit (INDEPENDENT_AMBULATORY_CARE_PROVIDER_SITE_OTHER): Payer: Medicare Other | Admitting: Family Medicine

## 2016-10-11 VITALS — BP 110/58 | HR 76 | Temp 98.0°F | Resp 16 | Wt 160.0 lb

## 2016-10-11 DIAGNOSIS — E1159 Type 2 diabetes mellitus with other circulatory complications: Secondary | ICD-10-CM

## 2016-10-11 DIAGNOSIS — Z794 Long term (current) use of insulin: Secondary | ICD-10-CM | POA: Diagnosis not present

## 2016-10-11 DIAGNOSIS — I25708 Atherosclerosis of coronary artery bypass graft(s), unspecified, with other forms of angina pectoris: Secondary | ICD-10-CM

## 2016-10-11 DIAGNOSIS — F418 Other specified anxiety disorders: Secondary | ICD-10-CM

## 2016-10-11 DIAGNOSIS — R5382 Chronic fatigue, unspecified: Secondary | ICD-10-CM

## 2016-10-11 DIAGNOSIS — G44229 Chronic tension-type headache, not intractable: Secondary | ICD-10-CM | POA: Diagnosis not present

## 2016-10-11 MED ORDER — CLOPIDOGREL BISULFATE 75 MG PO TABS: 75.0000 mg | ORAL_TABLET | Freq: Every day | ORAL | 1 refills | Status: DC

## 2016-10-11 MED ORDER — ASPIRIN 81 MG PO CHEW: 81.0000 mg | CHEWABLE_TABLET | Freq: Every day | ORAL | 3 refills | Status: DC

## 2016-10-11 NOTE — Patient Instructions (Signed)
Start to take 1 Diazepam once every morning and may take 1 in the evening if the headache is severe.  Tramadol can be taken during the only with severe headaches.   The patient is instructed that she must take the NTG if she gets the chest pain.  If no relief then she is to go to the ED.  Continue Cymbalta daily.  Discontinue the Oxcarbazepine.  Do not start the Tizanidine.  Follow up in September.

## 2016-10-11 NOTE — Progress Notes (Signed)
Candace [name]  MRN: [id number] DOB: Jan 04, 1951  Subjective:  HPI  The patient is a 66 year old female who presents for follow up of her chronic headaches.  The patient was last seen on 09/20/16 and she was instructed to stop the Tramadol and start Diazepam.  She was also instructed to start the Tizanidine which was given to her by neurology.  Two days after her visit she was continuing to have bad headaches and told to go back on the Tramadol and not start the Diazepam.  The patient had not started the Tizanidine from the neurologist.  Neurologist wanted the patient to wean off of the Oxcarbazepine. Patient was seen by Dr. Vicente Masson on 09/30/16 and he instructed her to stop Protonix, B12, Lantus, and Tizanidine and he wants to wean her off of the Zofran and Diazepam.  He increased her Metformin to 1000 mg BID.   Dr. Vicente Masson had also started her on Cymbalta which the patient has been taking. The patient and her daughter state that the only relief she got from her headaches was when she had taken a Diazepam and further state it was the best day she has had in a long time.   Blood sugars at home since coming off of the Lantus have been stable with none being over 130.  Patient had only taken 6 Zofran last week which she reports is less than what she took the week before and this week she has only taken 1 so far.    During her office interview today she began crying with pain dur to chest pain.  She was given a NTG and within a few minutes she did get relief.  Patient Active Problem List   Diagnosis Date Noted  . Depression with anxiety 09/30/2016  . PAF (paroxysmal atrial fibrillation) ([place]) 09/30/2016  . Chronic fatigue 09/30/2016  . Anemia 09/30/2016  . Resting tremor 09/30/2016  . Nausea & vomiting 09/30/2016  . Mild obstructive sleep apnea 09/30/2016  . Headache 08/18/2016  . Abnormal stress test 08/05/2016  . Unstable angina ([place]) 08/03/2016  . Diabetes ([place]) 08/03/2016  . Snoring  02/13/2016  . Bradycardia 12/31/2015  . Asthma 11/11/2015  . S/P CABG x 4 11/10/2015  . Coronary artery disease 11/07/2015  . Benign essential HTN 10/28/2014  . Episode of syncope 10/25/2014  . Carotid artery narrowing 02/08/2014  . Chest pain 02/08/2014  . Mixed hyperlipidemia 02/08/2014  . Atypical chest pain 02/07/2014  . Arteriosclerosis of coronary artery 02/07/2014  . Essential (primary) hypertension 02/07/2014  . Awareness of heartbeats 02/07/2014  . Abdominal pain 10/05/2013  . D (diarrhea) 10/05/2013  . Acid reflux 10/05/2013    Past Medical History:  Diagnosis Date  . Allergy   . Anemia   . Anxiety   . Arrhythmia   . Arthritis   . Coronary artery disease   . Depression   . Diabetes mellitus without complication ([place])   . Dyspnea    doe  . Dysrhythmia   . GERD (gastroesophageal reflux disease)   . Headache   . Hyperlipidemia   . Hypertension   . Myocardial infarction ([place])    2016  . Myocardial infarction with cardiac rehabilitation [facility])    MI 2016/ CABG 8/17    FINISHED CARDIAC REHAB 3 WEEKS AGO  . Panic attack   . Reflux   . Stroke ([place])   . Voice tremor     Social History   Social History  . Marital status: Divorced  Spouse name: N/A  . Number of children: N/A  . Years of education: N/A   Occupational History  . Not on file.   Social History Main Topics  . Smoking status: Former Smoker    Packs/day: 0.50    Types: Cigarettes    Quit date: 10/07/2001  . Smokeless tobacco: Never Used  . Alcohol use No  . Drug use: No  . Sexual activity: Not Currently   Other Topics Concern  . Not on file   Social History Narrative  . No narrative on file    Outpatient Encounter Prescriptions as of 10/11/2016  Medication Sig Note  . acetaminophen (TYLENOL) 500 MG tablet Take 1,000 mg by mouth 2 (two) times daily.   Marland Kitchen aspirin 81 MG chewable tablet Chew 1 tablet (81 mg total) by mouth daily.   . Cholecalciferol (VITAMIN D) 2000 units CAPS Take 1  capsule (2,000 Units total) by mouth daily. 10/11/2016: Patient has never started   . clopidogrel (PLAVIX) 75 MG tablet Take 1 tablet (75 mg total) by mouth daily with breakfast.   . diazepam (VALIUM) 2 MG tablet Take 1 tablet (2 mg total) by mouth every 8 (eight) hours as needed for anxiety.   . docusate sodium (COLACE) 100 MG capsule Take 100 mg by mouth daily as needed for mild constipation.   . DULoxetine (CYMBALTA) 30 MG capsule Take 1 capsule (30 mg total) by mouth daily.   . isosorbide mononitrate (IMDUR) 30 MG 24 hr tablet Take 1 tablet (30 mg total) by mouth 2 (two) times daily.   . metFORMIN (GLUCOPHAGE) 1000 MG tablet Take 1 tablet (1,000 mg total) by mouth 2 (two) times daily with a meal.   . metoprolol (LOPRESSOR) 50 MG tablet Take 50 mg by mouth 2 (two) times daily.    . nitroGLYCERIN (NITROSTAT) 0.4 MG SL tablet Place 1 tablet (0.4 mg total) under the tongue every 5 (five) minutes as needed for chest pain.   Marland Kitchen ondansetron (ZOFRAN ODT) 8 MG disintegrating tablet Take 1 tablet (8 mg total) by mouth every 8 (eight) hours as needed for nausea or vomiting.   . Oxcarbazepine (TRILEPTAL) 300 MG tablet Take 300 mg by mouth 2 (two) times daily.   . rosuvastatin (CRESTOR) 40 MG tablet Take 1 tablet (40 mg total) by mouth daily at 6 PM.   . traMADol (ULTRAM) 50 MG tablet Take 1 tablet (50 mg total) by mouth every 6 (six) hours as needed.   Marland Kitchen tiZANidine (ZANAFLEX) 2 MG tablet Take by mouth every 6 (six) hours as needed for muscle spasms.    No facility-administered encounter medications on file as of 10/11/2016.     Allergies  Allergen Reactions  . Penicillins Swelling, Rash and Other (See Comments)    Has patient had a PCN reaction causing immediate rash, facial/tongue/throat swelling, SOB or lightheadedness with hypotension: Yes Has patient had a PCN reaction causing severe rash involving mucus membranes or skin necrosis: Yes Has patient had a PCN reaction that required hospitalization  No Has patient had a PCN reaction occurring within the last 10 years: Yes If all of the above answers are "NO", then may proceed with Cephalosporin use.     Review of Systems  HENT: Negative.   Eyes: Positive for pain (with headaches). Negative for blurred vision, double vision and photophobia.  Respiratory: Negative for cough, shortness of breath and wheezing.   Cardiovascular: Positive for chest pain and palpitations. Negative for orthopnea and leg swelling.  Gastrointestinal: Negative.  Genitourinary: Negative.   Musculoskeletal: Negative.   Skin: Negative.   Neurological: Positive for weakness and headaches.  Endo/Heme/Allergies: Negative.   Psychiatric/Behavioral: Negative.        Tired but denies depression. Tired of feeling bad.    Objective:  BP (!) 110/58 (BP Location: Right Arm, Patient Position: Sitting, Cuff Size: Normal)   Pulse 76   Temp 98 F (36.7 C) (Oral)   Resp 16   Wt 160 lb (72.6 kg)   BMI 28.34 kg/m   Physical Exam  Constitutional: She is oriented to person, place, and time and well-developed, well-nourished, and in no distress.  HENT:  Head: Normocephalic and atraumatic.  Right Ear: External ear normal.  Left Ear: External ear normal.  Nose: Nose normal.  Eyes: Conjunctivae are normal. No scleral icterus.  Neck: No thyromegaly present.  Cardiovascular: Normal rate, regular rhythm and normal heart sounds.   Pulmonary/Chest: Effort normal and breath sounds normal.  Abdominal: Soft.  Lymphadenopathy:    She has no cervical adenopathy.  Neurological: She is alert and oriented to person, place, and time. Gait normal. GCS score is 15.  Skin: Skin is warm and dry.  Psychiatric: Mood, memory, affect and judgment normal.    Assessment and Plan :  Chronic Headache 2mg  diazepam seems to help so will try this while trying to stop other meds. I agree with minimizing meds for this pt. CAD/s/pCABG TIIDM Improving. Depression/Anxiety Now on  Cymbalta. More than 50% of visit spent in counselling. I have done the exam and reviewed the chart and it is accurate to the best of my knowledge. Development worker, community has been used and  any errors in dictation or transcription are unintentional. Miguel Aschoff M.D. [facility]

## 2016-10-23 ENCOUNTER — Other Ambulatory Visit: Payer: Self-pay | Admitting: Family Medicine

## 2016-10-23 DIAGNOSIS — G500 Trigeminal neuralgia: Secondary | ICD-10-CM

## 2016-11-04 ENCOUNTER — Encounter: Payer: Self-pay | Admitting: Family Medicine

## 2016-11-04 ENCOUNTER — Ambulatory Visit (INDEPENDENT_AMBULATORY_CARE_PROVIDER_SITE_OTHER): Payer: Medicare Other | Admitting: Family Medicine

## 2016-11-04 ENCOUNTER — Other Ambulatory Visit: Payer: Self-pay

## 2016-11-04 VITALS — BP 112/78 | HR 81 | Resp 16 | Ht 63.0 in | Wt 160.0 lb

## 2016-11-04 DIAGNOSIS — E782 Mixed hyperlipidemia: Secondary | ICD-10-CM | POA: Diagnosis not present

## 2016-11-04 DIAGNOSIS — E1159 Type 2 diabetes mellitus with other circulatory complications: Secondary | ICD-10-CM | POA: Diagnosis not present

## 2016-11-04 DIAGNOSIS — F418 Other specified anxiety disorders: Secondary | ICD-10-CM | POA: Diagnosis not present

## 2016-11-04 DIAGNOSIS — I10 Essential (primary) hypertension: Secondary | ICD-10-CM

## 2016-11-04 DIAGNOSIS — I480 Paroxysmal atrial fibrillation: Secondary | ICD-10-CM | POA: Diagnosis not present

## 2016-11-04 DIAGNOSIS — I25708 Atherosclerosis of coronary artery bypass graft(s), unspecified, with other forms of angina pectoris: Secondary | ICD-10-CM

## 2016-11-04 DIAGNOSIS — E559 Vitamin D deficiency, unspecified: Secondary | ICD-10-CM

## 2016-11-04 DIAGNOSIS — E118 Type 2 diabetes mellitus with unspecified complications: Secondary | ICD-10-CM

## 2016-11-04 DIAGNOSIS — G44229 Chronic tension-type headache, not intractable: Secondary | ICD-10-CM | POA: Diagnosis not present

## 2016-11-04 MED ORDER — DULOXETINE HCL 60 MG PO CPEP: 60.0000 mg | ORAL_CAPSULE | Freq: Every day | ORAL | 2 refills | Status: DC

## 2016-11-04 MED ORDER — GABAPENTIN 100 MG PO CAPS: 100.0000 mg | ORAL_CAPSULE | Freq: Every evening | ORAL | 2 refills | Status: DC | PRN

## 2016-11-04 MED ORDER — GABAPENTIN 100 MG PO CAPS: 100.0000 mg | ORAL_CAPSULE | Freq: Every day | ORAL | 2 refills | Status: DC

## 2016-11-04 MED ORDER — ISOSORBIDE MONONITRATE ER 30 MG PO TB24: 60.0000 mg | ORAL_TABLET | Freq: Every day | ORAL | 12 refills | Status: DC

## 2016-11-04 MED ORDER — ATORVASTATIN CALCIUM 40 MG PO TABS: 40.0000 mg | ORAL_TABLET | Freq: Every day | ORAL | 2 refills | Status: DC

## 2016-11-04 MED ORDER — METFORMIN HCL 1000 MG PO TABS: 1000.0000 mg | ORAL_TABLET | Freq: Two times a day (BID) | ORAL | 3 refills | Status: DC

## 2016-11-04 NOTE — Progress Notes (Signed)
Date:  11/04/2016   Name:  Candace [name]   DOB:  [birth year]   MRN:  [id number]  PCP:  Adline Potter, MD    Chief Complaint: Coronary Artery Disease ([facility] 2nd consult )   History of Present Illness:  This is a 66 y.o. female seen in one month f/u from initial consultation. Fosamax, Protonix, Xyzal stopped and Cymbalta started, has been taking at night. Feels better, daughter thinks mood improved. Saw cards who changed Imdur to 60 mg qam, no CP or NTG use since, occ SOB at rest.. Saw PCP who placed on diazepam bid prn as this seem to be the only thing that helps her headaches, is taking bid regularly. Sees neuro this PM. T2DM well controlled off Lantus on increase metformin, home BG's under 200, having some nausea/diarrhea, using Zofran qod. Crestor held since last visit given high dose and interaction with Plavix, needs statin given established CAD. PAF on DAPT per cards. Has not yet started vit D supplement, off B12 shots. Fall last week, admits to taking Tylenol PM regularly up to 8 times daily, makes her sleepy which helps headache, feels she will need something to help her sleep at night.   Review of Systems:  Review of Systems  Constitutional: Negative for chills and fever.  Respiratory: Negative for cough.   Cardiovascular: Negative for leg swelling.  Endocrine: Negative for polydipsia and polyuria.  Genitourinary: Negative for difficulty urinating.  Neurological: Negative for syncope and light-headedness.    Patient Active Problem List   Diagnosis Date Noted  . Depression with anxiety 09/30/2016  . PAF (paroxysmal atrial fibrillation) ([place]) 09/30/2016  . Chronic fatigue 09/30/2016  . Resting tremor 09/30/2016  . Nausea & vomiting 09/30/2016  . Mild obstructive sleep apnea 09/30/2016  . Headache 08/18/2016  . Abnormal stress test 08/05/2016  . Unstable angina ([place]) 08/03/2016  . Diabetes ([place]) 08/03/2016  . Snoring 02/13/2016  . Bradycardia 12/31/2015  . Asthma 11/11/2015   . S/P CABG x 4 11/10/2015  . Coronary artery disease 11/07/2015  . Carotid artery narrowing 02/08/2014  . Chest pain 02/08/2014  . Mixed hyperlipidemia 02/08/2014  . Atypical chest pain 02/07/2014  . Essential (primary) hypertension 02/07/2014  . Awareness of heartbeats 02/07/2014  . Abdominal pain 10/05/2013  . D (diarrhea) 10/05/2013  . Acid reflux 10/05/2013    Prior to Admission medications   Medication Sig Start Date End Date Taking? Authorizing Provider  acetaminophen (TYLENOL) 500 MG tablet Take 1,000 mg by mouth 2 (two) times daily.   Yes [provider]  aspirin 81 MG chewable tablet Chew 1 tablet (81 mg total) by mouth daily. 10/11/16  Yes [name]., MD  Cholecalciferol (VITAMIN D) 2000 units CAPS Take 1 capsule (2,000 Units total) by mouth daily. 10/04/16  Yes Lashuna Tamashiro, Gwyndolyn Saxon, MD  clopidogrel (PLAVIX) 75 MG tablet Take 1 tablet (75 mg total) by mouth daily with breakfast. 10/11/16  Yes [name]., MD  diazepam (VALIUM) 2 MG tablet Take 1 tablet by mouth 2 (two) times daily as needed. 09/20/16  Yes [provider]  docusate sodium (COLACE) 100 MG capsule Take 100 mg by mouth daily as needed for mild constipation.   Yes [provider]  DULoxetine (CYMBALTA) 60 MG capsule Take 1 capsule (60 mg total) by mouth daily. 11/04/16  Yes Lyrah Bradt, Gwyndolyn Saxon, MD  isosorbide mononitrate (IMDUR) 30 MG 24 hr tablet Take 2 tablets (60 mg total) by mouth daily. 11/04/16  Yes Adline Potter, MD  metoprolol (LOPRESSOR) 50 MG tablet Take 50 mg by mouth 2 (two) times daily.    Yes [provider]  nitroGLYCERIN (NITROSTAT) 0.4 MG SL tablet Place 1 tablet (0.4 mg total) under the tongue every 5 (five) minutes as needed for chest pain. 07/27/16  Yes Bettey Costa, MD  ondansetron (ZOFRAN ODT) 8 MG disintegrating tablet Take 1 tablet (8 mg total) by mouth every 8 (eight) hours as needed for nausea or vomiting. 06/22/16  Yes Jaffe, Adam R, DO  atorvastatin  (LIPITOR) 40 MG tablet Take 1 tablet (40 mg total) by mouth daily. 11/04/16   Jihan Rudy, Gwyndolyn Saxon, MD  gabapentin (NEURONTIN) 100 MG capsule Take 1 capsule (100 mg total) by mouth at bedtime. 11/04/16   Nathanuel Cabreja, Gwyndolyn Saxon, MD  metFORMIN (GLUCOPHAGE) 1000 MG tablet Take 1 tablet (1,000 mg total) by mouth 2 (two) times daily with a meal. 11/04/16   Adline Potter, MD    Allergies  Allergen Reactions  . Penicillins Swelling, Rash and Other (See Comments)    Has patient had a PCN reaction causing immediate rash, facial/tongue/throat swelling, SOB or lightheadedness with hypotension: Yes Has patient had a PCN reaction causing severe rash involving mucus membranes or skin necrosis: Yes Has patient had a PCN reaction that required hospitalization No Has patient had a PCN reaction occurring within the last 10 years: Yes If all of the above answers are "NO", then may proceed with Cephalosporin use.     Past Surgical History:  Procedure Laterality Date  . APPENDECTOMY    . ARTERY BIOPSY Right 04/26/2016   Procedure: BIOPSY TEMPORAL ARTERY;  Surgeon: Margaretha Sheffield, MD;  Location: [facility];  Service: ENT;  Laterality: Right;  . CARDIAC CATHETERIZATION N/A 11/06/2015   Procedure: Left Heart Cath and Coronary Angiography;  Surgeon: Corey Skains, MD;  Location: [place] [facility];  Service: Cardiovascular;  Laterality: N/A;  . CESAREAN SECTION    . CORONARY ARTERY BYPASS GRAFT N/A 11/10/2015   Procedure: CORONARY ARTERY BYPASS GRAFTING (CABG), ON PUMP, TIMES FOUR, USING LEFT INTERNAL MAMMARY ARTERY, BILATERAL GREATER SAPHENOUS VEINS HARVESTED ENDOSCOPICALLY;  Surgeon: [name], MD;  Location: [place];  Service: Open Heart Surgery;  Laterality: N/A;  LIMA-LAD; SEQ SVG-OM1-OM2; SVG-PL  . CORONARY STENT INTERVENTION N/A 08/05/2016   Procedure: Coronary Stent Intervention;  Surgeon: Isaias Cowman, MD;  Location: [place] [facility];  Service: Cardiovascular;  Laterality: N/A;  . LEFT HEART CATH AND  CORONARY ANGIOGRAPHY N/A 08/05/2016   Procedure: Left Heart Cath and Coronary Angiography;  Surgeon: Isaias Cowman, MD;  Location: [place] CV LAB;  Service: Cardiovascular;  Laterality: N/A;  . TEE WITHOUT CARDIOVERSION N/A 11/10/2015   Procedure: TRANSESOPHAGEAL ECHOCARDIOGRAM (TEE);  Surgeon: [name], MD;  Location: [place];  Service: Open Heart Surgery;  Laterality: N/A;  . TUBAL LIGATION      Social History  Substance Use Topics  . Smoking status: Former Smoker    Packs/day: 0.50    Types: Cigarettes    Quit date: 10/07/2001  . Smokeless tobacco: Never Used  . Alcohol use No    Family History  Problem Relation Age of Onset  . Cancer Father   . Hypertension Father   . Heart disease Father   . Cancer Mother   . Hypertension Mother   . Cancer Sister   . Breast cancer Sister 73  . Breast cancer Sister 71    Medication list has been reviewed and updated.  Physical Examination: BP 112/78   Pulse 81  Resp 16   Ht 5\' 3"  (1.6 m)   Wt 160 lb (72.6 kg)   SpO2 99%   BMI 28.34 kg/m   Physical Exam  Constitutional: She is oriented to person, place, and time. She appears well-developed and well-nourished.  Cardiovascular: Normal rate, regular rhythm and normal heart sounds.   Pulmonary/Chest: Effort normal and breath sounds normal.  Musculoskeletal: She exhibits no edema.  Neurological: She is alert and oriented to person, place, and time.  Skin: Skin is warm and dry.  Psychiatric: Her behavior is normal.  Affect brighter  Nursing note and vitals reviewed.   Assessment and Plan:  1. Type 2 diabetes mellitus with other circulatory complication, without long-term current use of insulin (HCC) Well controlled off Lantus on increased metformin, consider change to metformin ER if nausea/diarrhea persist  2. Depression with anxiety Improved on Cymbalta, increase to 60 mg daily and take in AM, use Valium bid prn, hope to wean off soon  3. Coronary artery  disease of bypass graft of native heart with stable angina pectoris ([place]) Cont metoprolol/Imdur/NTG, cards following  4. Essential (primary) hypertension Well controlled on metoprolol (consdier ER daily)  5. PAF (paroxysmal atrial fibrillation) ([place]) Well controlled on metoprolol/DAPT, cards following  6. Mixed hyperlipidemia Change Crestor to Lipitor 40 mg daily (fewer se's in elderly)  7. Chronic tension-type headache, not intractable Appears tension/anxiety related, should improve on Cymbalta, cont Valium prn for now, to see neuro this PM  8. Vitamin D deficiency Begin supplement  9. Med review Stop Tylenol PM given high risk confusion/falls, may use gabapentin 100 mg qhs prn insomnia   Return in about 4 weeks (around 12/02/2016).  Satira Anis. [facility]  11/04/2016

## 2016-11-04 NOTE — Patient Instructions (Addendum)
Change Crestor to Lipitor 40 mg daily. Increase Cymbalta to 60 mg daily and take in the morning. Use gabapentin at night to help with sleep. Stop Tylenol PM but stay on Tylenol 1000 mg twice daily. Use diazepam as needed only (should need less as Cymbalta kicks in).

## 2016-11-23 ENCOUNTER — Telehealth: Payer: Self-pay | Admitting: Family Medicine

## 2016-11-23 NOTE — Telephone Encounter (Signed)
Please review-aa 

## 2016-11-23 NOTE — Telephone Encounter (Signed)
Pt's daughter Gus Height called requesting a note for pt to be dismissed for jury duty.Call back # is [phone].If not able to call today contact Dava Gravley at [phone]

## 2016-11-23 NOTE — Telephone Encounter (Signed)
Please write note for this. Thank you

## 2016-11-25 NOTE — Telephone Encounter (Signed)
Done, waiting on signature.

## 2016-11-25 NOTE — Telephone Encounter (Signed)
Done. Dava informed and will pick up today.

## 2016-12-01 ENCOUNTER — Encounter: Payer: Self-pay | Admitting: Family Medicine

## 2016-12-01 DIAGNOSIS — E538 Deficiency of other specified B group vitamins: Secondary | ICD-10-CM | POA: Insufficient documentation

## 2016-12-06 ENCOUNTER — Other Ambulatory Visit: Payer: Self-pay | Admitting: Family Medicine

## 2016-12-06 NOTE — Telephone Encounter (Signed)
Forward to [facility] please

## 2016-12-06 NOTE — Telephone Encounter (Signed)
Pt's daughter states pt needs refill on her Zofran 8mg    [facility]  Pt's call back [phone]  Thanks Con Memos

## 2016-12-07 MED ORDER — ONDANSETRON 8 MG PO TBDP: 8.0000 mg | ORAL_TABLET | Freq: Three times a day (TID) | ORAL | 3 refills | Status: DC | PRN

## 2016-12-07 NOTE — Telephone Encounter (Signed)
ondansetron (ZOFRAN ODT) 8 MG disintegrating tablet Take 1 tablet (8 mg total) by mouth every 8 (eight) hours as needed for nausea or vomiting.     Disp: 30 tablet Refills: 3   Class: Normal Start: 12/07/2016  Approved by: [name]., MD To be filled at: [id number] [place], [place] - [address]: [phone] Please fill the above request that came to [place] per [facility]

## 2016-12-09 ENCOUNTER — Ambulatory Visit: Payer: Medicare Other | Admitting: Family Medicine

## 2016-12-13 ENCOUNTER — Ambulatory Visit: Payer: Self-pay | Admitting: Family Medicine

## 2016-12-23 ENCOUNTER — Encounter: Payer: Self-pay | Admitting: Emergency Medicine

## 2016-12-23 ENCOUNTER — Telehealth: Payer: Self-pay

## 2016-12-23 ENCOUNTER — Emergency Department: Payer: Medicare Other

## 2016-12-23 ENCOUNTER — Observation Stay
Admission: EM | Admit: 2016-12-23 | Discharge: 2016-12-24 | Disposition: A | Discharge status: Home / Self Care | Payer: Medicare Other | Attending: Internal Medicine | Admitting: Internal Medicine

## 2016-12-23 DIAGNOSIS — G4733 Obstructive sleep apnea (adult) (pediatric): Secondary | ICD-10-CM | POA: Insufficient documentation

## 2016-12-23 DIAGNOSIS — R9439 Abnormal result of other cardiovascular function study: Secondary | ICD-10-CM | POA: Insufficient documentation

## 2016-12-23 DIAGNOSIS — R51 Headache: Secondary | ICD-10-CM | POA: Diagnosis not present

## 2016-12-23 DIAGNOSIS — R197 Diarrhea, unspecified: Secondary | ICD-10-CM | POA: Insufficient documentation

## 2016-12-23 DIAGNOSIS — I499 Cardiac arrhythmia, unspecified: Secondary | ICD-10-CM | POA: Insufficient documentation

## 2016-12-23 DIAGNOSIS — R079 Chest pain, unspecified: Secondary | ICD-10-CM | POA: Diagnosis present

## 2016-12-23 DIAGNOSIS — Z8673 Personal history of transient ischemic attack (TIA), and cerebral infarction without residual deficits: Secondary | ICD-10-CM | POA: Insufficient documentation

## 2016-12-23 DIAGNOSIS — R110 Nausea: Secondary | ICD-10-CM | POA: Insufficient documentation

## 2016-12-23 DIAGNOSIS — E119 Type 2 diabetes mellitus without complications: Secondary | ICD-10-CM | POA: Diagnosis not present

## 2016-12-23 DIAGNOSIS — Z801 Family history of malignant neoplasm of trachea, bronchus and lung: Secondary | ICD-10-CM | POA: Insufficient documentation

## 2016-12-23 DIAGNOSIS — E782 Mixed hyperlipidemia: Secondary | ICD-10-CM | POA: Diagnosis not present

## 2016-12-23 DIAGNOSIS — Z87891 Personal history of nicotine dependence: Secondary | ICD-10-CM | POA: Diagnosis not present

## 2016-12-23 DIAGNOSIS — Z955 Presence of coronary angioplasty implant and graft: Secondary | ICD-10-CM | POA: Diagnosis not present

## 2016-12-23 DIAGNOSIS — Z7984 Long term (current) use of oral hypoglycemic drugs: Secondary | ICD-10-CM | POA: Insufficient documentation

## 2016-12-23 DIAGNOSIS — I252 Old myocardial infarction: Secondary | ICD-10-CM | POA: Insufficient documentation

## 2016-12-23 DIAGNOSIS — F418 Other specified anxiety disorders: Secondary | ICD-10-CM | POA: Insufficient documentation

## 2016-12-23 DIAGNOSIS — Z8249 Family history of ischemic heart disease and other diseases of the circulatory system: Secondary | ICD-10-CM | POA: Insufficient documentation

## 2016-12-23 DIAGNOSIS — R498 Other voice and resonance disorders: Secondary | ICD-10-CM | POA: Diagnosis not present

## 2016-12-23 DIAGNOSIS — I493 Ventricular premature depolarization: Secondary | ICD-10-CM | POA: Insufficient documentation

## 2016-12-23 DIAGNOSIS — Z8042 Family history of malignant neoplasm of prostate: Secondary | ICD-10-CM | POA: Insufficient documentation

## 2016-12-23 DIAGNOSIS — F329 Major depressive disorder, single episode, unspecified: Secondary | ICD-10-CM | POA: Diagnosis not present

## 2016-12-23 DIAGNOSIS — Z880 Allergy status to penicillin: Secondary | ICD-10-CM | POA: Diagnosis not present

## 2016-12-23 DIAGNOSIS — K219 Gastro-esophageal reflux disease without esophagitis: Secondary | ICD-10-CM | POA: Insufficient documentation

## 2016-12-23 DIAGNOSIS — Z23 Encounter for immunization: Secondary | ICD-10-CM | POA: Diagnosis not present

## 2016-12-23 DIAGNOSIS — R0789 Other chest pain: Secondary | ICD-10-CM | POA: Diagnosis not present

## 2016-12-23 DIAGNOSIS — E538 Deficiency of other specified B group vitamins: Secondary | ICD-10-CM | POA: Insufficient documentation

## 2016-12-23 DIAGNOSIS — Z7982 Long term (current) use of aspirin: Secondary | ICD-10-CM | POA: Diagnosis not present

## 2016-12-23 DIAGNOSIS — I10 Essential (primary) hypertension: Secondary | ICD-10-CM | POA: Insufficient documentation

## 2016-12-23 DIAGNOSIS — E785 Hyperlipidemia, unspecified: Secondary | ICD-10-CM | POA: Insufficient documentation

## 2016-12-23 DIAGNOSIS — I25110 Atherosclerotic heart disease of native coronary artery with unstable angina pectoris: Secondary | ICD-10-CM | POA: Diagnosis not present

## 2016-12-23 DIAGNOSIS — Z79899 Other long term (current) drug therapy: Secondary | ICD-10-CM | POA: Insufficient documentation

## 2016-12-23 DIAGNOSIS — J45909 Unspecified asthma, uncomplicated: Secondary | ICD-10-CM | POA: Insufficient documentation

## 2016-12-23 DIAGNOSIS — R0600 Dyspnea, unspecified: Secondary | ICD-10-CM | POA: Diagnosis not present

## 2016-12-23 DIAGNOSIS — Z951 Presence of aortocoronary bypass graft: Secondary | ICD-10-CM | POA: Insufficient documentation

## 2016-12-23 DIAGNOSIS — Z7902 Long term (current) use of antithrombotics/antiplatelets: Secondary | ICD-10-CM | POA: Diagnosis not present

## 2016-12-23 DIAGNOSIS — E559 Vitamin D deficiency, unspecified: Secondary | ICD-10-CM | POA: Insufficient documentation

## 2016-12-23 DIAGNOSIS — Z803 Family history of malignant neoplasm of breast: Secondary | ICD-10-CM | POA: Insufficient documentation

## 2016-12-23 DIAGNOSIS — Z800 Family history of malignant neoplasm of digestive organs: Secondary | ICD-10-CM | POA: Insufficient documentation

## 2016-12-23 DIAGNOSIS — I491 Atrial premature depolarization: Secondary | ICD-10-CM | POA: Insufficient documentation

## 2016-12-23 DIAGNOSIS — I480 Paroxysmal atrial fibrillation: Secondary | ICD-10-CM | POA: Insufficient documentation

## 2016-12-23 LAB — TROPONIN I
Troponin I: <0.03 ng/mL (ref <0.03)
Troponin I: <0.03 ng/mL (ref <0.03)

## 2016-12-23 LAB — BASIC METABOLIC PANEL
Anion gap: 9 (ref 5–15)
BUN: 13 mg/dL (ref 6–20)
CO2: 26 mmol/L (ref 22–32)
Calcium: 9.3 mg/dL (ref 8.9–10.3)
Chloride: 106 mmol/L (ref 101–111)
Creatinine, Ser: 0.6 mg/dL (ref 0.44–1.00)
GFR calc Af Amer: >60 mL/min (ref >60)
GFR calc non Af Amer: >60 mL/min (ref >60)
Glucose, Bld: 117 mg/dL — ABNORMAL HIGH (ref 65–99)
Potassium: 4.3 mmol/L (ref 3.5–5.1)
Sodium: 141 mmol/L (ref 135–145)

## 2016-12-23 LAB — CBC
HCT: 37.2 % (ref 35.0–47.0)
Hemoglobin: 12.7 g/dL (ref 12.0–16.0)
MCH: 31.2 pg (ref 26.0–34.0)
MCHC: 34 g/dL (ref 32.0–36.0)
MCV: 91.9 fL (ref 80.0–100.0)
Platelets: 295 10*3/uL (ref 150–440)
RBC: 4.05 MIL/uL (ref 3.80–5.20)
RDW: 15.2 % — ABNORMAL HIGH (ref 11.5–14.5)
WBC: 6.1 10*3/uL (ref 3.6–11.0)

## 2016-12-23 LAB — HEMOGLOBIN A1C
Hgb A1c MFr Bld: 7.3 % — ABNORMAL HIGH (ref 4.8–5.6)
Mean Plasma Glucose: 162.81 mg/dL

## 2016-12-23 LAB — GLUCOSE, CAPILLARY
Glucose-Capillary: 83 mg/dL (ref 65–99)
Glucose-Capillary: 98 mg/dL (ref 65–99)

## 2016-12-23 LAB — TSH: TSH: 1.661 u[IU]/mL (ref 0.350–4.500)

## 2016-12-23 MED ORDER — ATORVASTATIN CALCIUM 20 MG PO TABS
40.0000 mg | ORAL_TABLET | Freq: Every day | ORAL | Status: DC
  Administered 2016-12-23 – 2016-12-24 (×3): 40 mg via ORAL
  Filled 2016-12-23 (×3): qty 2

## 2016-12-23 MED ORDER — ASPIRIN 81 MG PO CHEW
81.0000 mg | CHEWABLE_TABLET | Freq: Every day | ORAL | Status: DC
  Administered 2016-12-24: 81 mg via ORAL
  Filled 2016-12-23: qty 1

## 2016-12-23 MED ORDER — CLOPIDOGREL BISULFATE 75 MG PO TABS
75.0000 mg | ORAL_TABLET | Freq: Every day | ORAL | Status: DC
  Administered 2016-12-24: 75 mg via ORAL
  Filled 2016-12-23: qty 1

## 2016-12-23 MED ORDER — ACETAMINOPHEN 325 MG PO TABS: 650.0000 mg | ORAL_TABLET | Freq: Four times a day (QID) | ORAL | Status: DC | PRN

## 2016-12-23 MED ORDER — ONDANSETRON HCL 4 MG/2ML IJ SOLN
INTRAMUSCULAR | Status: AC
  Filled 2016-12-23: qty 2

## 2016-12-23 MED ORDER — ONDANSETRON HCL 4 MG/2ML IJ SOLN
4.0000 mg | Freq: Four times a day (QID) | INTRAMUSCULAR | Status: DC | PRN
  Administered 2016-12-23 – 2016-12-24 (×3): 4 mg via INTRAVENOUS
  Filled 2016-12-23 (×2): qty 2

## 2016-12-23 MED ORDER — ACETAMINOPHEN 650 MG RE SUPP: 650.0000 mg | Freq: Four times a day (QID) | RECTAL | Status: DC | PRN

## 2016-12-23 MED ORDER — METOPROLOL TARTRATE 50 MG PO TABS
50.0000 mg | ORAL_TABLET | Freq: Two times a day (BID) | ORAL | Status: DC
  Administered 2016-12-23 – 2016-12-24 (×2): 50 mg via ORAL
  Filled 2016-12-23 (×2): qty 1

## 2016-12-23 MED ORDER — ONDANSETRON HCL 4 MG PO TABS: 4.0000 mg | ORAL_TABLET | Freq: Four times a day (QID) | ORAL | Status: DC | PRN

## 2016-12-23 MED ORDER — SODIUM CHLORIDE 0.9 % IV SOLN
INTRAVENOUS | Status: DC
  Administered 2016-12-23: 18:00:00 via INTRAVENOUS

## 2016-12-23 MED ORDER — MORPHINE SULFATE (PF) 2 MG/ML IV SOLN
2.0000 mg | Freq: Once | INTRAVENOUS | Status: AC
  Administered 2016-12-23: 2 mg via INTRAVENOUS
  Filled 2016-12-23: qty 1

## 2016-12-23 MED ORDER — INFLUENZA VAC SPLIT HIGH-DOSE 0.5 ML IM SUSY
0.5000 mL | PREFILLED_SYRINGE | INTRAMUSCULAR | Status: AC
  Administered 2016-12-24: 0.5 mL via INTRAMUSCULAR
  Filled 2016-12-23 (×2): qty 0.5

## 2016-12-23 MED ORDER — DIAZEPAM 2 MG PO TABS
2.0000 mg | ORAL_TABLET | Freq: Two times a day (BID) | ORAL | Status: DC | PRN
  Administered 2016-12-23: 2 mg via ORAL
  Filled 2016-12-23: qty 1

## 2016-12-23 MED ORDER — ENOXAPARIN SODIUM 40 MG/0.4ML ~~LOC~~ SOLN
40.0000 mg | SUBCUTANEOUS | Status: DC
  Administered 2016-12-23: 40 mg via SUBCUTANEOUS
  Filled 2016-12-23: qty 0.4

## 2016-12-23 MED ORDER — INSULIN ASPART 100 UNIT/ML ~~LOC~~ SOLN: 0.0000 [IU] | Freq: Every day | SUBCUTANEOUS | Status: DC

## 2016-12-23 MED ORDER — INSULIN ASPART 100 UNIT/ML ~~LOC~~ SOLN
0.0000 [IU] | Freq: Three times a day (TID) | SUBCUTANEOUS | Status: DC
  Administered 2016-12-24: 3 [IU] via SUBCUTANEOUS
  Filled 2016-12-23: qty 1

## 2016-12-23 MED ORDER — PNEUMOCOCCAL VAC POLYVALENT 25 MCG/0.5ML IJ INJ
0.5000 mL | INJECTION | INTRAMUSCULAR | Status: AC
  Administered 2016-12-24: 0.5 mL via INTRAMUSCULAR
  Filled 2016-12-23: qty 0.5

## 2016-12-23 MED ORDER — DULOXETINE HCL 30 MG PO CPEP
60.0000 mg | ORAL_CAPSULE | Freq: Every day | ORAL | Status: DC
  Administered 2016-12-24: 60 mg via ORAL
  Filled 2016-12-23: qty 2

## 2016-12-23 MED ORDER — MORPHINE SULFATE (PF) 2 MG/ML IV SOLN
2.0000 mg | INTRAVENOUS | Status: DC | PRN
  Administered 2016-12-23 – 2016-12-24 (×3): 2 mg via INTRAVENOUS
  Filled 2016-12-23 (×3): qty 1

## 2016-12-23 MED ORDER — DOCUSATE SODIUM 100 MG PO CAPS: 100.0000 mg | ORAL_CAPSULE | Freq: Every day | ORAL | Status: DC | PRN

## 2016-12-23 MED ORDER — ISOSORBIDE MONONITRATE ER 60 MG PO TB24
60.0000 mg | ORAL_TABLET | Freq: Every day | ORAL | Status: DC
  Administered 2016-12-24: 60 mg via ORAL
  Filled 2016-12-23: qty 1

## 2016-12-23 NOTE — H&P (Signed)
[facility] NAME: Candace [name]    MR#:  [id number]  DATE OF BIRTH:  [birth year]  DATE OF ADMISSION:  12/23/2016  PRIMARY CARE PHYSICIAN: [name]., MD   REQUESTING/REFERRING PHYSICIAN: Dr. Charlotte Crumb  CHIEF COMPLAINT:   Chief Complaint  Patient presents with  . Chest Pain    HISTORY OF PRESENT ILLNESS:  Candace [name]  is a 66 y.o. female with a known history of hypertension, diabetes mellitus, GERD, chronic headaches, CAD status post CABG in 2017 and a cardiac stent in May 2018 presents to hospital secondary to worsening chest pain today. Patient started having cardiac symptoms almost for 8 years now. She has a cardiac catheterization done in 2011 showing moderate occlusive disease, but medical management was recommended at that time. In the last year she's had several episodes of chest pain and in August 2017 she had a bypass surgery done. Since then she has had occasional chest pains and  chronic angina symptoms. She had a 95% occlusion to SVG to obtuse marginal 2 graft anastomotic site occlusion and stent placement in May 2018. Her pains never completely resolved. She has seen her cardiologist and had a stress test done last month for ongoing chest pains and it was normal.She was advised to contact her physician then her chest pains were much worse in intensity and associated with other symptoms. For the last 3 days her pains have been more persistent, radiating to her left shoulder. Also associated with nausea and diaphoresis. Today the pain was much intense and she called her primary care doctor's office who have recommended her to come to the emergency room. Her first troponin is negative at this time. We'll admit her for observation and cardiology evaluation.  PAST MEDICAL HISTORY:   Past Medical History:  Diagnosis Date  . Allergy   . Anemia   . Anxiety   . Arrhythmia   . Arthritis   . Coronary artery  disease   . Depression   . Diabetes mellitus without complication ([place])   . Dyspnea    doe  . Dysrhythmia   . GERD (gastroesophageal reflux disease)   . Headache   . Hyperlipidemia   . Hypertension   . Myocardial infarction ([place])    2016  . Myocardial infarction with cardiac rehabilitation [facility])    MI 2016/ CABG 8/17    FINISHED CARDIAC REHAB 3 WEEKS AGO  . Panic attack   . Reflux   . Stroke ([place])   . Voice tremor     PAST SURGICAL HISTORY:   Past Surgical History:  Procedure Laterality Date  . APPENDECTOMY    . ARTERY BIOPSY Right 04/26/2016   Procedure: BIOPSY TEMPORAL ARTERY;  Surgeon: Margaretha Sheffield, MD;  Location: [facility] ORS;  Service: ENT;  Laterality: Right;  . CARDIAC CATHETERIZATION N/A 11/06/2015   Procedure: Left Heart Cath and Coronary Angiography;  Surgeon: Corey Skains, MD;  Location: [place] CV LAB;  Service: Cardiovascular;  Laterality: N/A;  . CESAREAN SECTION    . CORONARY ARTERY BYPASS GRAFT N/A 11/10/2015   Procedure: CORONARY ARTERY BYPASS GRAFTING (CABG), ON PUMP, TIMES FOUR, USING LEFT INTERNAL MAMMARY ARTERY, BILATERAL GREATER SAPHENOUS VEINS HARVESTED ENDOSCOPICALLY;  Surgeon: [name], MD;  Location: [place];  Service: Open Heart Surgery;  Laterality: N/A;  LIMA-LAD; SEQ SVG-OM1-OM2; SVG-PL  . CORONARY STENT INTERVENTION N/A 08/05/2016   Procedure: Coronary Stent Intervention;  Surgeon: Isaias Cowman, MD;  Location: [facility]  INVASIVE CV LAB;  Service: Cardiovascular;  Laterality: N/A;  . LEFT HEART CATH AND CORONARY ANGIOGRAPHY N/A 08/05/2016   Procedure: Left Heart Cath and Coronary Angiography;  Surgeon: Isaias Cowman, MD;  Location: [place] CV LAB;  Service: Cardiovascular;  Laterality: N/A;  . TEE WITHOUT CARDIOVERSION N/A 11/10/2015   Procedure: TRANSESOPHAGEAL ECHOCARDIOGRAM (TEE);  Surgeon: [name], MD;  Location: [place];  Service: Open Heart Surgery;  Laterality: N/A;  . TUBAL LIGATION      SOCIAL HISTORY:    Social History  Substance Use Topics  . Smoking status: Former Smoker    Packs/day: 0.50    Types: Cigarettes    Quit date: 10/07/2001  . Smokeless tobacco: Never Used  . Alcohol use No    FAMILY HISTORY:   Family History  Problem Relation Age of Onset  . Cancer Father   . Hypertension Father   . Heart disease Father   . Cancer Mother   . Hypertension Mother   . Cancer Sister   . Breast cancer Sister 51  . Breast cancer Sister 69    DRUG ALLERGIES:   Allergies  Allergen Reactions  . Penicillins Swelling, Rash and Other (See Comments)    Has patient had a PCN reaction causing immediate rash, facial/tongue/throat swelling, SOB or lightheadedness with hypotension: Yes Has patient had a PCN reaction causing severe rash involving mucus membranes or skin necrosis: Yes Has patient had a PCN reaction that required hospitalization No Has patient had a PCN reaction occurring within the last 10 years: Yes If all of the above answers are "NO", then may proceed with Cephalosporin use.     REVIEW OF SYSTEMS:   Review of Systems  Constitutional: Positive for malaise/fatigue. Negative for chills, fever and weight loss.  HENT: Negative for ear discharge, ear pain, hearing loss and nosebleeds.   Eyes: Negative for blurred vision, double vision and photophobia.  Respiratory: Negative for cough, hemoptysis, shortness of breath and wheezing.   Cardiovascular: Positive for chest pain. Negative for palpitations, orthopnea and leg swelling.  Gastrointestinal: Positive for nausea. Negative for abdominal pain, constipation, diarrhea, heartburn, melena and vomiting.  Genitourinary: Negative for dysuria, frequency, hematuria and urgency.  Musculoskeletal: Negative for back pain, myalgias and neck pain.  Skin: Negative for rash.  Neurological: Positive for headaches. Negative for dizziness, tingling, tremors, sensory change, speech change and focal weakness.  Endo/Heme/Allergies: Does not  bruise/bleed easily.  Psychiatric/Behavioral: Negative for depression.    MEDICATIONS AT HOME:   Prior to Admission medications   Medication Sig Start Date End Date Taking? Authorizing Provider  acetaminophen (TYLENOL) 500 MG tablet Take 1,000 mg by mouth 2 (two) times daily.   Yes [provider]  aspirin 81 MG chewable tablet Chew 1 tablet (81 mg total) by mouth daily. 10/11/16  Yes [name]., MD  atorvastatin (LIPITOR) 40 MG tablet Take 1 tablet (40 mg total) by mouth daily. 11/04/16  Yes Plonk, Gwyndolyn Saxon, MD  clopidogrel (PLAVIX) 75 MG tablet Take 1 tablet (75 mg total) by mouth daily with breakfast. 10/11/16  Yes [name]., MD  diazepam (VALIUM) 2 MG tablet Take 1 tablet by mouth 2 (two) times daily as needed. 09/20/16  Yes [provider]  DULoxetine (CYMBALTA) 60 MG capsule Take 1 capsule (60 mg total) by mouth daily. 11/04/16  Yes Plonk, Gwyndolyn Saxon, MD  isosorbide mononitrate (IMDUR) 30 MG 24 hr tablet Take 2 tablets (60 mg total) by mouth daily. 11/04/16  Yes Plonk, Gwyndolyn Saxon,  MD  metFORMIN (GLUCOPHAGE) 1000 MG tablet Take 1 tablet (1,000 mg total) by mouth 2 (two) times daily with a meal. 11/04/16  Yes Plonk, Gwyndolyn Saxon, MD  metoprolol (LOPRESSOR) 50 MG tablet Take 50 mg by mouth 2 (two) times daily.    Yes [provider]  ondansetron (ZOFRAN ODT) 8 MG disintegrating tablet Take 1 tablet (8 mg total) by mouth every 8 (eight) hours as needed for nausea or vomiting. 12/07/16  Yes [name]., MD  Cholecalciferol (VITAMIN D) 2000 units CAPS Take 1 capsule (2,000 Units total) by mouth daily. Patient not taking: Reported on 12/23/2016 10/04/16   Adline Potter, MD  docusate sodium (COLACE) 100 MG capsule Take 100 mg by mouth daily as needed for mild constipation.    [provider]  gabapentin (NEURONTIN) 100 MG capsule Take 1 capsule (100 mg total) by mouth at bedtime as needed. Patient not taking: Reported on 12/23/2016 11/04/16   Adline Potter, MD  nitroGLYCERIN (NITROSTAT) 0.4 MG SL tablet Place 1 tablet (0.4 mg total) under the tongue every 5 (five) minutes as needed for chest pain. 07/27/16   Bettey Costa, MD      VITAL SIGNS:  Blood pressure (!) 163/84, pulse 67, temperature 98.7 F (37.1 C), temperature source Oral, resp. rate 11, weight 72.6 kg (160 lb), SpO2 97 %.  PHYSICAL EXAMINATION:   Physical Exam  GENERAL:  66 y.o.-year-old patient lying in the bed with no acute distress.  EYES: Pupils equal, round, reactive to light and accommodation. No scleral icterus. Extraocular muscles intact.  HEENT: Head atraumatic, normocephalic. Oropharynx and nasopharynx clear.  NECK:  Supple, no jugular venous distention. No thyroid enlargement, no tenderness.  LUNGS: Normal breath sounds bilaterally, no wheezing, rales,rhonchi or crepitation. No use of accessory muscles of respiration.  CARDIOVASCULAR: S1, S2 normal. No murmurs, rubs, or gallops. CABG scar noted. ABDOMEN: Soft, nontender, nondistended. Bowel sounds present. No organomegaly or mass.  EXTREMITIES: No pedal edema, cyanosis, or clubbing.  NEUROLOGIC: Cranial nerves II through XII are intact. Muscle strength 5/5 in all extremities. Sensation intact. Gait not checked.  PSYCHIATRIC: The patient is alert and oriented x 3.  SKIN: No obvious rash, lesion, or ulcer.   LABORATORY PANEL:   CBC  Recent Labs Lab 12/23/16 1300  WBC 6.1  HGB 12.7  HCT 37.2  PLT 295   ------------------------------------------------------------------------------------------------------------------  Chemistries   Recent Labs Lab 12/23/16 1300  NA 141  K 4.3  CL 106  CO2 26  GLUCOSE 117*  BUN 13  CREATININE 0.60  CALCIUM 9.3   ------------------------------------------------------------------------------------------------------------------  Cardiac Enzymes  Recent Labs Lab 12/23/16 1300  TROPONINI <0.03    ------------------------------------------------------------------------------------------------------------------  RADIOLOGY:  Dg Chest 2 View  Result Date: 12/23/2016 CLINICAL DATA:  Chest pain EXAM: CHEST  2 VIEW COMPARISON:  08/03/2016 FINDINGS: The lungs are clear without focal pneumonia, edema, pneumothorax or pleural effusion. The cardiopericardial silhouette is within normal limits for size. Patient is status post CABG. The visualized bony structures of the thorax are intact. IMPRESSION: Stable.  No acute findings. Electronically Signed   By: Misty Stanley M.D.   On: 12/23/2016 12:48    EKG:   Orders placed or performed during the hospital encounter of 12/23/16  . EKG 12-Lead  . EKG 12-Lead  . EKG 12-Lead  . EKG 12-Lead  . EKG 12-Lead  . EKG 12-Lead  . EKG 12-Lead  . EKG 12-Lead  . EKG 12-Lead  . EKG 12-Lead  . ED EKG within 10  minutes  . ED EKG within 10 minutes    IMPRESSION AND PLAN:   [place]  is a 66 y.o. female with a known history of hypertension, diabetes mellitus, GERD, chronic headaches, CAD status post CABG in 2017 and a cardiac stent in May 2018 presents to hospital secondary to worsening chest pain today.  #1 Chest pain- ? Chronic angina - admit to tele, recycle troponins - Just had stress test done which was negative last month. Will hold off on repeating again - continue cardiac medications at this time. -Cardiology consult is requested - on Imdur- can increase the dose or add ranexa- however has chronic headaches too  #2 CAD s/p CABG- asa, plavix, metoprolol, imdur and statin  #3 Chronic headaches- following with neuorlogy- off tramadol now, they have recommended to get off tylenol and the  Start prednisone for myofascial pain  #4 DM- hold metformin, in case she needs a cardiac cath ssi  #5 DVT Prophylaxis- lovenox   All the records are reviewed and case discussed with ED provider. Management plans discussed with the patient, family and  they are in agreement.  CODE STATUS: Full Code  TOTAL TIME TAKING CARE OF THIS PATIENT: 50 minutes.    Gladstone Lighter M.D on 12/23/2016 at 4:04 PM  Between 7am to 6pm - Pager - [phone]  After 6pm go to [email or web address] - password [email or web address] [facility]  Office  [phone]  CC: Primary care physician; [name]., MD

## 2016-12-23 NOTE — Telephone Encounter (Signed)
Patients daughter called office stating that patient has been complaining of left sided chest pain since the beginning of this week radiating to middle of chest. Patient admitted to have similar symptoms of reflux with what she thought was indigestion and belching. Today patient stated that pain was worse, daughter said while on phone that patient was grabbing to her chest and was short of breath. I advised daughter to hand up phone and contact emergency services as this could be a caridac event and not reflux. Patient was negative for signs of dizziness, blurred vision/speech, headache, numbness or weakness. KW

## 2016-12-23 NOTE — ED Notes (Signed)
AAOx3.  Skin warm and dry.  NAD 

## 2016-12-23 NOTE — ED Triage Notes (Signed)
Pt to ed from home with c/o chest pain, shortness of breath for two days, states with radiation up into right side neck. Pt with hx of MI and states this feels the same. Pt took four nitro tabs this am with no relief.

## 2016-12-23 NOTE — Progress Notes (Signed)
Pt complaining of an 8 out of 10 pain in her chest radiating to her head, the only PRN's pt has is tylenol. MD paged, Dr. Tressia Miners to put in orders for morphine.

## 2016-12-23 NOTE — ED Provider Notes (Signed)
[facility]  ____________________________________________   I have reviewed the triage vital signs and the nursing notes.   HISTORY  Chief Complaint Chest Pain    HPI Candace [name] is a 66 y.o. female With a very significant heart history but no history of PE or DVT. She states that she's had left-sided chest pain going up towards her jaw the last 2 days. This is similar but not exactly the same as her prior ACS. Patient has extensive cardiac history with stents placed most recently in May of this year., also had a stress test in August.   pain is sharp radiates to her jaw nothing makes it better and nothing makes it worse nonexertional associated with mild nausea and occasional shortness of breath suddenly not pleuritic no leg swelling.   Past Medical History:  Diagnosis Date  . Allergy   . Anemia   . Anxiety   . Arrhythmia   . Arthritis   . Coronary artery disease   . Depression   . Diabetes mellitus without complication ([place])   . Dyspnea    doe  . Dysrhythmia   . GERD (gastroesophageal reflux disease)   . Headache   . Hyperlipidemia   . Hypertension   . Myocardial infarction ([place])    2016  . Myocardial infarction with cardiac rehabilitation [facility])    MI 2016/ CABG 8/17    FINISHED CARDIAC REHAB 3 WEEKS AGO  . Panic attack   . Reflux   . Stroke ([place])   . Voice tremor     Patient Active Problem List   Diagnosis Date Noted  . B12 deficiency 12/01/2016  . Vitamin D deficiency 11/04/2016  . Depression with anxiety 09/30/2016  . PAF (paroxysmal atrial fibrillation) ([place]) 09/30/2016  . Chronic fatigue 09/30/2016  . Resting tremor 09/30/2016  . Nausea & vomiting 09/30/2016  . Mild obstructive sleep apnea 09/30/2016  . Headache 08/18/2016  . Abnormal stress test 08/05/2016  . Unstable angina ([place]) 08/03/2016  . Diabetes ([place]) 08/03/2016  . Snoring 02/13/2016  . Bradycardia 12/31/2015  .  Asthma 11/11/2015  . S/P CABG x 4 11/10/2015  . Coronary artery disease 11/07/2015  . Carotid artery narrowing 02/08/2014  . Chest pain 02/08/2014  . Mixed hyperlipidemia 02/08/2014  . Atypical chest pain 02/07/2014  . Essential (primary) hypertension 02/07/2014  . Awareness of heartbeats 02/07/2014  . Abdominal pain 10/05/2013  . D (diarrhea) 10/05/2013  . Acid reflux 10/05/2013    Past Surgical History:  Procedure Laterality Date  . APPENDECTOMY    . ARTERY BIOPSY Right 04/26/2016   Procedure: BIOPSY TEMPORAL ARTERY;  Surgeon: Margaretha Sheffield, MD;  Location: [facility] ORS;  Service: ENT;  Laterality: Right;  . CARDIAC CATHETERIZATION N/A 11/06/2015   Procedure: Left Heart Cath and Coronary Angiography;  Surgeon: Corey Skains, MD;  Location: [place] CV LAB;  Service: Cardiovascular;  Laterality: N/A;  . CESAREAN SECTION    . CORONARY ARTERY BYPASS GRAFT N/A 11/10/2015   Procedure: CORONARY ARTERY BYPASS GRAFTING (CABG), ON PUMP, TIMES FOUR, USING LEFT INTERNAL MAMMARY ARTERY, BILATERAL GREATER SAPHENOUS VEINS HARVESTED ENDOSCOPICALLY;  Surgeon: [name], MD;  Location: [place];  Service: Open Heart Surgery;  Laterality: N/A;  LIMA-LAD; SEQ SVG-OM1-OM2; SVG-PL  . CORONARY STENT INTERVENTION N/A 08/05/2016   Procedure: Coronary Stent Intervention;  Surgeon: Isaias Cowman, MD;  Location: [place] CV LAB;  Service: Cardiovascular;  Laterality: N/A;  . LEFT HEART CATH AND CORONARY ANGIOGRAPHY N/A  08/05/2016   Procedure: Left Heart Cath and Coronary Angiography;  Surgeon: Isaias Cowman, MD;  Location: [place] CV LAB;  Service: Cardiovascular;  Laterality: N/A;  . TEE WITHOUT CARDIOVERSION N/A 11/10/2015   Procedure: TRANSESOPHAGEAL ECHOCARDIOGRAM (TEE);  Surgeon: [name], MD;  Location: [name];  Service: Open Heart Surgery;  Laterality: N/A;  . TUBAL LIGATION      Prior to Admission medications   Medication Sig Start Date End Date Taking? Authorizing  Provider  acetaminophen (TYLENOL) 500 MG tablet Take 1,000 mg by mouth 2 (two) times daily.   Yes [provider]  aspirin 81 MG chewable tablet Chew 1 tablet (81 mg total) by mouth daily. 10/11/16  Yes [name]., MD  atorvastatin (LIPITOR) 40 MG tablet Take 1 tablet (40 mg total) by mouth daily. 11/04/16  Yes Plonk, Gwyndolyn Saxon, MD  clopidogrel (PLAVIX) 75 MG tablet Take 1 tablet (75 mg total) by mouth daily with breakfast. 10/11/16  Yes [name]., MD  diazepam (VALIUM) 2 MG tablet Take 1 tablet by mouth 2 (two) times daily as needed. 09/20/16  Yes [provider]  DULoxetine (CYMBALTA) 60 MG capsule Take 1 capsule (60 mg total) by mouth daily. 11/04/16  Yes Plonk, Gwyndolyn Saxon, MD  isosorbide mononitrate (IMDUR) 30 MG 24 hr tablet Take 2 tablets (60 mg total) by mouth daily. 11/04/16  Yes Plonk, Gwyndolyn Saxon, MD  metFORMIN (GLUCOPHAGE) 1000 MG tablet Take 1 tablet (1,000 mg total) by mouth 2 (two) times daily with a meal. 11/04/16  Yes Plonk, Gwyndolyn Saxon, MD  metoprolol (LOPRESSOR) 50 MG tablet Take 50 mg by mouth 2 (two) times daily.    Yes [provider]  ondansetron (ZOFRAN ODT) 8 MG disintegrating tablet Take 1 tablet (8 mg total) by mouth every 8 (eight) hours as needed for nausea or vomiting. 12/07/16  Yes [name]., MD  Cholecalciferol (VITAMIN D) 2000 units CAPS Take 1 capsule (2,000 Units total) by mouth daily. Patient not taking: Reported on 12/23/2016 10/04/16   Adline Potter, MD  docusate sodium (COLACE) 100 MG capsule Take 100 mg by mouth daily as needed for mild constipation.    [provider]  gabapentin (NEURONTIN) 100 MG capsule Take 1 capsule (100 mg total) by mouth at bedtime as needed. Patient not taking: Reported on 12/23/2016 11/04/16   Adline Potter, MD  nitroGLYCERIN (NITROSTAT) 0.4 MG SL tablet Place 1 tablet (0.4 mg total) under the tongue every 5 (five) minutes as needed for chest pain. 07/27/16   Bettey Costa, MD     Allergies Penicillins  Family History  Problem Relation Age of Onset  . Cancer Father   . Hypertension Father   . Heart disease Father   . Cancer Mother   . Hypertension Mother   . Cancer Sister   . Breast cancer Sister 48  . Breast cancer Sister 90    Social History Social History  Substance Use Topics  . Smoking status: Former Smoker    Packs/day: 0.50    Types: Cigarettes    Quit date: 10/07/2001  . Smokeless tobacco: Never Used  . Alcohol use No    Review of Systems Constitutional: No fever/chills Eyes: No visual changes. ENT: No sore throat. No stiff neck no neck pain Cardiovascular: positivechest pain. Respiratory: mild shortness of breath. Gastrointestinal:   no vomiting.  No diarrhea.  No constipation. Genitourinary: Negative for dysuria. Musculoskeletal: Negative lower extremity swelling Skin: Negative for rash. Neurological: Negative for severe headaches, focal  weakness or numbness.   ____________________________________________   PHYSICAL EXAM:  VITAL SIGNS: ED Triage Vitals [12/23/16 1213]  Enc Vitals Group     BP 122/87     Pulse Rate 72     Resp 20     Temp 98.7 F (37.1 C)     Temp Source Oral     SpO2 98 %     Weight 160 lb (72.6 kg)     Height      Head Circumference      Peak Flow      Pain Score 8     Pain Loc      Pain Edu?      Excl. in [place]?     Constitutional: Alert and oriented. Well appearing and in no acute distress. Eyes: Conjunctivae are normal Head: Atraumatic HEENT: No congestion/rhinnorhea. Mucous membranes are moist.  Oropharynx non-erythematous Neck:   Nontender with no meningismus, no masses, no stridor Cardiovascular: Normal rate, regular rhythm. Grossly normal heart sounds.  Good peripheral circulation. chest,: tender to palpation left chest wall, when I touch that area patient states "ouch that's tender" but states that this is a different pain from the pain that brought her in. Respiratory: Normal  respiratory effort.  No retractions. Lungs CTAB. Abdominal: Soft and nontender. No distention. No guarding no rebound Back:  There is no focal tenderness or step off.  there is no midline tenderness there are no lesions noted. there is no CVA tenderness Musculoskeletal: No lower extremity tenderness, no upper extremity tenderness. No joint effusions, no DVT signs strong distal pulses no edema Neurologic:  Normal speech and language. No gross focal neurologic deficits are appreciated.  Skin:  Skin is warm, dry and intact. No rash noted. Psychiatric: Mood and affect are normal. Speech and behavior are normal.  ____________________________________________   LABS (all labs ordered are listed, but only abnormal results are displayed)  Labs Reviewed  BASIC METABOLIC PANEL - Abnormal; Notable for the following:       Result Value   Glucose, Bld 117 (*)    All other components within normal limits  CBC - Abnormal; Notable for the following:    RDW 15.2 (*)    All other components within normal limits  TROPONIN I    Pertinent labs  results that were available during my care of the patient were reviewed by me and considered in my medical decision making (see chart for details). ____________________________________________  EKG  I personally interpreted any EKGs ordered by me or triage normal sinus rhythm at 70 bpm no acute ST vision acute ST depression normal axis, no acute ischemic changes ____________________________________________  RADIOLOGY  Pertinent labs & imaging results that were available during my care of the patient were reviewed by me and considered in my medical decision making (see chart for details). If possible, patient and/or family made aware of any abnormal findings. ____________________________________________    PROCEDURES  Procedure(s) performed: None  Procedures  Critical Care performed: None  ____________________________________________   INITIAL  IMPRESSION / ASSESSMENT AND PLAN / ED COURSE  Pertinent labs & imaging results that were available during my care of the patient were reviewed by me and considered in my medical decision making (see chart for details).  patient with chest pain, she has chest wall pain which she states that is not reproducing her actual pain. His been going on for 2 weeks and her workup is reassuring here. Patient and family are very concerned that this is her ACS.  It would be somewhat atypical story for angina however. They did want me to speak to her cardiologist. I did discuss with Dr. Josefa Half  who feels the patient should be admitted for observation and rule out. Patient is compliant with her medications. Very low suspicion for PE with this constant nonpleuritic pain without any significant dyspnea. Low suspicion also for dissection.    ____________________________________________   FINAL CLINICAL IMPRESSION(S) / ED DIAGNOSES  Final diagnoses:  Chest pain, unspecified type      This chart was dictated using voice recognition software.  Despite best efforts to proofread,  errors can occur which can change meaning.      Schuyler Amor, MD 12/23/16 1459

## 2016-12-24 DIAGNOSIS — R0789 Other chest pain: Secondary | ICD-10-CM | POA: Diagnosis not present

## 2016-12-24 LAB — BASIC METABOLIC PANEL
Anion gap: 6 (ref 5–15)
BUN: 14 mg/dL (ref 6–20)
CO2: 28 mmol/L (ref 22–32)
Calcium: 8.9 mg/dL (ref 8.9–10.3)
Chloride: 106 mmol/L (ref 101–111)
Creatinine, Ser: 0.5 mg/dL (ref 0.44–1.00)
GFR calc Af Amer: >60 mL/min (ref >60)
GFR calc non Af Amer: >60 mL/min (ref >60)
Glucose, Bld: 121 mg/dL — ABNORMAL HIGH (ref 65–99)
Potassium: 3.9 mmol/L (ref 3.5–5.1)
Sodium: 140 mmol/L (ref 135–145)

## 2016-12-24 LAB — CBC
HCT: 33.6 % — ABNORMAL LOW (ref 35.0–47.0)
Hemoglobin: 11.3 g/dL — ABNORMAL LOW (ref 12.0–16.0)
MCH: 30.8 pg (ref 26.0–34.0)
MCHC: 33.7 g/dL (ref 32.0–36.0)
MCV: 91.3 fL (ref 80.0–100.0)
Platelets: 274 10*3/uL (ref 150–440)
RBC: 3.68 MIL/uL — ABNORMAL LOW (ref 3.80–5.20)
RDW: 15.2 % — ABNORMAL HIGH (ref 11.5–14.5)
WBC: 6.4 10*3/uL (ref 3.6–11.0)

## 2016-12-24 LAB — GLUCOSE, CAPILLARY
Glucose-Capillary: 100 mg/dL — ABNORMAL HIGH (ref 65–99)
Glucose-Capillary: 241 mg/dL — ABNORMAL HIGH (ref 65–99)

## 2016-12-24 LAB — TROPONIN I
Troponin I: <0.03 ng/mL (ref <0.03)
Troponin I: <0.03 ng/mL (ref <0.03)

## 2016-12-24 MED ORDER — RANOLAZINE ER 500 MG PO TB12: 500.0000 mg | ORAL_TABLET | Freq: Two times a day (BID) | ORAL | 0 refills | Status: DC

## 2016-12-24 MED ORDER — RANOLAZINE ER 500 MG PO TB12
500.0000 mg | ORAL_TABLET | Freq: Two times a day (BID) | ORAL | Status: DC
  Administered 2016-12-24: 500 mg via ORAL
  Filled 2016-12-24 (×2): qty 1

## 2016-12-24 MED ORDER — OXYCODONE-ACETAMINOPHEN 5-325 MG PO TABS
1.0000 | ORAL_TABLET | Freq: Four times a day (QID) | ORAL | Status: DC | PRN
Start: 2016-12-24 — End: 2016-12-24
  Administered 2016-12-24: 1 via ORAL
  Filled 2016-12-24: qty 1

## 2016-12-24 MED ORDER — ALUM & MAG HYDROXIDE-SIMETH 200-200-20 MG/5ML PO SUSP: 30.0000 mL | Freq: Four times a day (QID) | ORAL | 0 refills | Status: DC | PRN

## 2016-12-24 MED ORDER — ALUM & MAG HYDROXIDE-SIMETH 200-200-20 MG/5ML PO SUSP: 30.0000 mL | Freq: Four times a day (QID) | ORAL | Status: DC | PRN

## 2016-12-24 NOTE — Care Management Obs Status (Signed)
[place] NOTIFICATION   Patient Details  Name: Candace [name] MRN: [id number] Date of Birth: 10-15-1950   Medicare Observation Status Notification Given:  Yes Notice signed, one given to patient and the other to HIM for scanning   Katrina Stack, RN 12/24/2016, 9:47 AM

## 2016-12-24 NOTE — Discharge Summary (Signed)
[facility] NAME: Candace [name]    MR#:  [id number]  DATE OF BIRTH:  10-Oct-1950  DATE OF ADMISSION:  12/23/2016   ADMITTING PHYSICIAN: Gladstone Lighter, MD  DATE OF DISCHARGE: 12/24/2016  1:00 PM  PRIMARY CARE PHYSICIAN: [name]., MD   ADMISSION DIAGNOSIS:  Chest pain, unspecified type [R07.9] DISCHARGE DIAGNOSIS:  Active Problems:   Chest pain  SECONDARY DIAGNOSIS:   Past Medical History:  Diagnosis Date  . Allergy   . Anemia   . Anxiety   . Arrhythmia   . Arthritis   . Coronary artery disease   . Depression   . Diabetes mellitus without complication ([place])   . Dyspnea    doe  . Dysrhythmia   . GERD (gastroesophageal reflux disease)   . Headache   . Hyperlipidemia   . Hypertension   . Myocardial infarction ([place])    2016  . Myocardial infarction with cardiac rehabilitation [facility])    MI 2016/ CABG 8/17    FINISHED CARDIAC REHAB 3 WEEKS AGO  . Panic attack   . Reflux   . Stroke ([place])   . Voice tremor    HOSPITAL COURSE:   Candace [name]  is a 66 y.o. female with a known history of hypertension, diabetes mellitus, GERD, chronic headaches, CAD status post CABG in 2017 and a cardiac stent in May 2018 presents to hospital secondary to worsening chest pain today.  #1 atypical chest pain. No ACS. Just had stress test done which was negative last month. - continue cardiac medications at this time. Add renexa per Dr. Saralyn Pilar.  #2 CAD s/p CABG- asa, plavix, metoprolol, imdur and statin  #3 Chronic headaches- following with neuorlogy- off tramadol now, they have recommended to get off tylenol and the  Start prednisone for myofascial pain  #4 DM- hold metformin, in case she needs a cardiac cath ssi  GERD. Maalox when necessary. I discussed with cardiologist Dr. Saralyn Pilar. DISCHARGE CONDITIONS:  Stable, discharged to home today. CONSULTS OBTAINED:  Treatment Team:  Isaias Cowman,  MD DRUG ALLERGIES:   Allergies  Allergen Reactions  . Penicillins Swelling, Rash and Other (See Comments)    Has patient had a PCN reaction causing immediate rash, facial/tongue/throat swelling, SOB or lightheadedness with hypotension: Yes Has patient had a PCN reaction causing severe rash involving mucus membranes or skin necrosis: Yes Has patient had a PCN reaction that required hospitalization No Has patient had a PCN reaction occurring within the last 10 years: Yes If all of the above answers are "NO", then may proceed with Cephalosporin use.    DISCHARGE MEDICATIONS:   Allergies as of 12/24/2016      Reactions   Penicillins Swelling, Rash, Other (See Comments)   Has patient had a PCN reaction causing immediate rash, facial/tongue/throat swelling, SOB or lightheadedness with hypotension: Yes Has patient had a PCN reaction causing severe rash involving mucus membranes or skin necrosis: Yes Has patient had a PCN reaction that required hospitalization No Has patient had a PCN reaction occurring within the last 10 years: Yes If all of the above answers are "NO", then may proceed with Cephalosporin use.      Medication List    TAKE these medications   acetaminophen 500 MG tablet Commonly known as:  TYLENOL Take 1,000 mg by mouth 2 (two) times daily.   alum & mag hydroxide-simeth 200-200-20 MG/5ML suspension Commonly known as:  MAALOX/MYLANTA Take 30 mLs by mouth  every 6 (six) hours as needed for indigestion or heartburn.   aspirin 81 MG chewable tablet Chew 1 tablet (81 mg total) by mouth daily.   atorvastatin 40 MG tablet Commonly known as:  LIPITOR Take 1 tablet (40 mg total) by mouth daily.   clopidogrel 75 MG tablet Commonly known as:  PLAVIX Take 1 tablet (75 mg total) by mouth daily with breakfast.   diazepam 2 MG tablet Commonly known as:  VALIUM Take 1 tablet by mouth 2 (two) times daily as needed.   docusate sodium 100 MG capsule Commonly known as:   COLACE Take 100 mg by mouth daily as needed for mild constipation.   DULoxetine 60 MG capsule Commonly known as:  CYMBALTA Take 1 capsule (60 mg total) by mouth daily.   gabapentin 100 MG capsule Commonly known as:  NEURONTIN Take 1 capsule (100 mg total) by mouth at bedtime as needed.   isosorbide mononitrate 30 MG 24 hr tablet Commonly known as:  IMDUR Take 2 tablets (60 mg total) by mouth daily.   metFORMIN 1000 MG tablet Commonly known as:  GLUCOPHAGE Take 1 tablet (1,000 mg total) by mouth 2 (two) times daily with a meal.   metoprolol tartrate 50 MG tablet Commonly known as:  LOPRESSOR Take 50 mg by mouth 2 (two) times daily.   nitroGLYCERIN 0.4 MG SL tablet Commonly known as:  NITROSTAT Place 1 tablet (0.4 mg total) under the tongue every 5 (five) minutes as needed for chest pain.   ondansetron 8 MG disintegrating tablet Commonly known as:  ZOFRAN ODT Take 1 tablet (8 mg total) by mouth every 8 (eight) hours as needed for nausea or vomiting.   ranolazine 500 MG 12 hr tablet Commonly known as:  RANEXA Take 1 tablet (500 mg total) by mouth 2 (two) times daily.   Vitamin D 2000 units Caps Take 1 capsule (2,000 Units total) by mouth daily.            Discharge Care Instructions        Start     Ordered   12/24/16 0000  Increase activity slowly     12/24/16 1053   12/24/16 0000  Diet - low sodium heart healthy     12/24/16 1053   12/24/16 0000  ranolazine (RANEXA) 500 MG 12 hr tablet  2 times daily     12/24/16 1056   12/24/16 0000  alum & mag hydroxide-simeth (MAALOX/MYLANTA) 200-200-20 MG/5ML suspension  Every 6 hours PRN     12/24/16 1057       DISCHARGE INSTRUCTIONS:  See AVS. If you experience worsening of your admission symptoms, develop shortness of breath, life threatening emergency, suicidal or homicidal thoughts you must seek medical attention immediately by calling 911 or calling your MD immediately  if symptoms less severe.  You Must read  complete instructions/literature along with all the possible adverse reactions/side effects for all the Medicines you take and that have been prescribed to you. Take any new Medicines after you have completely understood and accpet all the possible adverse reactions/side effects.   Please note  You were cared for by a hospitalist during your hospital stay. If you have any questions about your discharge medications or the care you received while you were in the hospital after you are discharged, you can call the unit and asked to speak with the hospitalist on call if the hospitalist that took care of you is not available. Once you are discharged, your primary care physician  will handle any further medical issues. Please note that NO REFILLS for any discharge medications will be authorized once you are discharged, as it is imperative that you return to your primary care physician (or establish a relationship with a primary care physician if you do not have one) for your aftercare needs so that they can reassess your need for medications and monitor your lab values.    On the day of Discharge:  VITAL SIGNS:  Blood pressure (!) 154/88, pulse 79, temperature 97.8 F (36.6 C), temperature source Oral, resp. rate 18, weight 160 lb (72.6 kg), SpO2 98 %. PHYSICAL EXAMINATION:  GENERAL:  66 y.o.-year-old patient lying in the bed with no acute distress.  EYES: Pupils equal, round, reactive to light and accommodation. No scleral icterus. Extraocular muscles intact.  HEENT: Head atraumatic, normocephalic. Oropharynx and nasopharynx clear.  NECK:  Supple, no jugular venous distention. No thyroid enlargement, no tenderness.  LUNGS: Normal breath sounds bilaterally, no wheezing, rales,rhonchi or crepitation. No use of accessory muscles of respiration.  CARDIOVASCULAR: S1, S2 normal. No murmurs, rubs, or gallops.  ABDOMEN: Soft, non-tender, non-distended. Bowel sounds present. No organomegaly or mass.   EXTREMITIES: No pedal edema, cyanosis, or clubbing.  NEUROLOGIC: Cranial nerves II through XII are intact. Muscle strength 5/5 in all extremities. Sensation intact. Gait not checked.  PSYCHIATRIC: The patient is alert and oriented x 3.  SKIN: No obvious rash, lesion, or ulcer.  DATA REVIEW:   CBC  Recent Labs Lab 12/24/16 0515  WBC 6.4  HGB 11.3*  HCT 33.6*  PLT 274    Chemistries   Recent Labs Lab 12/24/16 0515  NA 140  K 3.9  CL 106  CO2 28  GLUCOSE 121*  BUN 14  CREATININE 0.50  CALCIUM 8.9     Microbiology Results  Results for orders placed or performed during the hospital encounter of 04/22/16  Surgical pcr screen     Status: None   Collection Time: 04/22/16 10:06 AM  Result Value Ref Range Status   MRSA, PCR NEGATIVE NEGATIVE Final   Staphylococcus aureus NEGATIVE NEGATIVE Final    Comment:        The Xpert SA Assay (FDA approved for NASAL specimens in patients over 65 years of age), is one component of a comprehensive surveillance program.  Test performance has been validated by [facility] for patients greater than or equal to 2 year old. It is not intended to diagnose infection nor to guide or monitor treatment.     RADIOLOGY:  No results found.   Management plans discussed with the patient, her daughter and sister and they are in agreement.  CODE STATUS: Full Code   TOTAL TIME TAKING CARE OF THIS PATIENT: 38 minutes.    Demetrios Loll M.D on 12/24/2016 at 4:33 PM  Between 7am to 6pm - Pager - [phone]  After 6pm go to [email or web address] - password [email or web address] [facility]  [facility] [place] [facility]  Office  [phone]  CC: Primary care physician; [name]., MD   Note: This dictation was prepared with Dragon dictation along with smaller phrase technology. Any transcriptional errors that result from this process are unintentional.

## 2016-12-24 NOTE — Progress Notes (Signed)
Went over discharge instruction including medication and follow-up appointment with the patient and family. Discontinue peripheral IV and telemetry monitor. Will call volunteer for transport.

## 2016-12-24 NOTE — Consult Note (Signed)
[facility] Cardiology  CARDIOLOGY CONSULT NOTE  Patient ID: Candace [name] MRN: [id number] DOB/AGE: [birth year] 66 y.o.  Admit date: 12/23/2016 Referring Physician Tressia Miners Primary Physician [name]., MD  Primary Cardiologist Nehemiah Massed Reason for Consultation Chest pain  HPI: 66 year old female referred for evaluation of chest pain. The patient has a history of coronary artery disease, status post CABG x 4 in August 2017, and stent in May 2018 after abnormal stress test, type 2 diabetes, hypertension, hyperlipidemia, chronic headaches, and history of stroke. The patient has had chronic anginal symptoms for years, which has been persistent despite CABG and PCI. The pain comes and goes, occurring with both exertion and rest. The patient reports experiencing chest pain for the last 5 days, which she describes as a sharp, shooting pain located on the left side with radiation to her left shoulder with associated shortness of breath. The episodes would last about 5 minutes, come and go, occasionally relieved with nitroglycerin. The patient states this type of chest pain was different than what she has experienced in the past, as it felt more like indigestion. She continued to have ongoing chest pain throughout the night this Wednesday, unable to sleep, and on the morning of Thursday, she had associated diaphoresis, and decided to call her primary care provider to get an appointment to be seen. She was advised to go to the ER. Labs notable for negative troponin x 4. ECG without ischemic changes. The patient states her chest pain has improved, but is still present, since being in the hospital and receiving morphine.   She reports constant headaches since January.    Past Medical History:  Diagnosis Date  . Allergy   . Anemia   . Anxiety   . Arrhythmia   . Arthritis   . Coronary artery disease   . Depression   . Diabetes mellitus without complication ([place])   . Dyspnea    doe  .  Dysrhythmia   . GERD (gastroesophageal reflux disease)   . Headache   . Hyperlipidemia   . Hypertension   . Myocardial infarction ([place])    2016  . Myocardial infarction with cardiac rehabilitation [facility])    MI 2016/ CABG 8/17    FINISHED CARDIAC REHAB 3 WEEKS AGO  . Panic attack   . Reflux   . Stroke ([place])   . Voice tremor     Past Surgical History:  Procedure Laterality Date  . APPENDECTOMY    . ARTERY BIOPSY Right 04/26/2016   Procedure: BIOPSY TEMPORAL ARTERY;  Surgeon: Margaretha Sheffield, MD;  Location: [facility] ORS;  Service: ENT;  Laterality: Right;  . CARDIAC CATHETERIZATION N/A 11/06/2015   Procedure: Left Heart Cath and Coronary Angiography;  Surgeon: Corey Skains, MD;  Location: [place] CV LAB;  Service: Cardiovascular;  Laterality: N/A;  . CESAREAN SECTION    . CORONARY ARTERY BYPASS GRAFT N/A 11/10/2015   Procedure: CORONARY ARTERY BYPASS GRAFTING (CABG), ON PUMP, TIMES FOUR, USING LEFT INTERNAL MAMMARY ARTERY, BILATERAL GREATER SAPHENOUS VEINS HARVESTED ENDOSCOPICALLY;  Surgeon: [name], MD;  Location: [place];  Service: Open Heart Surgery;  Laterality: N/A;  LIMA-LAD; SEQ SVG-OM1-OM2; SVG-PL  . CORONARY STENT INTERVENTION N/A 08/05/2016   Procedure: Coronary Stent Intervention;  Surgeon: Isaias Cowman, MD;  Location: [place] CV LAB;  Service: Cardiovascular;  Laterality: N/A;  . LEFT HEART CATH AND CORONARY ANGIOGRAPHY N/A 08/05/2016   Procedure: Left Heart Cath and Coronary Angiography;  Surgeon: Isaias Cowman, MD;  Location: [place] CV  LAB;  Service: Cardiovascular;  Laterality: N/A;  . TEE WITHOUT CARDIOVERSION N/A 11/10/2015   Procedure: TRANSESOPHAGEAL ECHOCARDIOGRAM (TEE);  Surgeon: [name], MD;  Location: [place];  Service: Open Heart Surgery;  Laterality: N/A;  . TUBAL LIGATION      Prescriptions Prior to Admission  Medication Sig Dispense Refill Last Dose  . acetaminophen (TYLENOL) 500 MG tablet Take 1,000 mg by mouth 2 (two)  times daily.   12/23/2016 at 0800  . aspirin 81 MG chewable tablet Chew 1 tablet (81 mg total) by mouth daily. 90 tablet 3 12/23/2016 at 0800  . atorvastatin (LIPITOR) 40 MG tablet Take 1 tablet (40 mg total) by mouth daily. 30 tablet 2 12/22/2016 at 2000  . clopidogrel (PLAVIX) 75 MG tablet Take 1 tablet (75 mg total) by mouth daily with breakfast. 90 tablet 1 12/23/2016 at 0800  . diazepam (VALIUM) 2 MG tablet Take 1 tablet by mouth 2 (two) times daily as needed.   12/22/2016 at 0800  . DULoxetine (CYMBALTA) 60 MG capsule Take 1 capsule (60 mg total) by mouth daily. 30 capsule 2 12/23/2016 at 0800  . isosorbide mononitrate (IMDUR) 30 MG 24 hr tablet Take 2 tablets (60 mg total) by mouth daily. 60 tablet 12 12/23/2016 at 0800  . metFORMIN (GLUCOPHAGE) 1000 MG tablet Take 1 tablet (1,000 mg total) by mouth 2 (two) times daily with a meal. 180 tablet 3 12/23/2016 at 0800  . metoprolol (LOPRESSOR) 50 MG tablet Take 50 mg by mouth 2 (two) times daily.    12/23/2016 at 0800  . ondansetron (ZOFRAN ODT) 8 MG disintegrating tablet Take 1 tablet (8 mg total) by mouth every 8 (eight) hours as needed for nausea or vomiting. 30 tablet 3 12/23/2016 at 0800  . Cholecalciferol (VITAMIN D) 2000 units CAPS Take 1 capsule (2,000 Units total) by mouth daily. (Patient not taking: Reported on 12/23/2016) 30 capsule  Not Taking at Unknown time  . docusate sodium (COLACE) 100 MG capsule Take 100 mg by mouth daily as needed for mild constipation.   prn at prn  . gabapentin (NEURONTIN) 100 MG capsule Take 1 capsule (100 mg total) by mouth at bedtime as needed. (Patient not taking: Reported on 12/23/2016) 30 capsule 2 Not Taking at Unknown time  . nitroGLYCERIN (NITROSTAT) 0.4 MG SL tablet Place 1 tablet (0.4 mg total) under the tongue every 5 (five) minutes as needed for chest pain. 30 tablet 0 prn at prn   Social History   Social History  . Marital status: Divorced    Spouse name: N/A  . Number of children: N/A  . Years of  education: N/A   Occupational History  . Not on file.   Social History Main Topics  . Smoking status: Former Smoker    Packs/day: 0.50    Types: Cigarettes    Quit date: 10/07/2001  . Smokeless tobacco: Never Used  . Alcohol use No  . Drug use: No  . Sexual activity: Not Currently   Other Topics Concern  . Not on file   Social History Narrative  . No narrative on file    Family History  Problem Relation Age of Onset  . Cancer Father   . Hypertension Father   . Heart disease Father   . Cancer Mother   . Hypertension Mother   . Cancer Sister   . Breast cancer Sister 7  . Breast cancer Sister 91  . Lung cancer Brother   . Pancreatic cancer Brother  Review of systems complete and found to be negative unless listed above      PHYSICAL EXAM  General: Well developed, well nourished, in no acute distress HEENT:  Normocephalic and atramatic Neck:  No JVD.  Lungs: Clear bilaterally to auscultation Heart: HRRR . Normal S1 and S2 without gallops or murmurs.  Abdomen: Bowel sounds are positive, abdomen soft and non-tender  Msk:  Back normal, able to sit upright. Gait not assessed. Strength and tone normal for age. Extremities: No clubbing, cyanosis or edema.   Neuro: Alert and oriented X 3. Psych:  Good affect, responds appropriately  Labs:   Lab Results  Component Value Date   WBC 6.4 12/24/2016   HGB 11.3 (L) 12/24/2016   HCT 33.6 (L) 12/24/2016   MCV 91.3 12/24/2016   PLT 274 12/24/2016    Recent Labs Lab 12/24/16 0515  NA 140  K 3.9  CL 106  CO2 28  BUN 14  CREATININE 0.50  CALCIUM 8.9  GLUCOSE 121*   Lab Results  Component Value Date   CKTOTAL 91 08/01/2012   CKMB 5.9 (H) 07/14/2013   TROPONINI <0.03 12/24/2016    Lab Results  Component Value Date   CHOL 259 (H) 10/01/2016   CHOL 214 (H) 07/14/2013   CHOL 234 (H) 05/01/2012   Lab Results  Component Value Date   HDL 66 10/01/2016   HDL 36 (L) 07/14/2013   HDL 56 05/01/2012    Lab Results  Component Value Date   LDLCALC 173 (H) 10/01/2016   LDLCALC 154 (H) 07/14/2013   LDLCALC 158 (H) 05/01/2012   Lab Results  Component Value Date   TRIG 100 10/01/2016   TRIG 118 07/14/2013   TRIG 99 05/01/2012   Lab Results  Component Value Date   CHOLHDL 3.9 10/01/2016   No results found for: LDLDIRECT    Radiology: Dg Chest 2 View  Result Date: 12/23/2016 CLINICAL DATA:  Chest pain EXAM: CHEST  2 VIEW COMPARISON:  08/03/2016 FINDINGS: The lungs are clear without focal pneumonia, edema, pneumothorax or pleural effusion. The cardiopericardial silhouette is within normal limits for size. Patient is status post CABG. The visualized bony structures of the thorax are intact. IMPRESSION: Stable.  No acute findings. Electronically Signed   By: Misty Stanley M.D.   On: 12/23/2016 12:48    EKG: Sinus rhythm, 72 bpm  ASSESSMENT AND PLAN:  1. Chest pain, atypical, likely not ACS, more consistent with indigestion. ECG without ischemic changes, troponin x 4 negative. The patient has had persistent chest pain for years, unaffected by CABG or stent placement. 2. Known CAD, status post CABG August 2017 and stent May 2018 3. Hypertension 4. Hyperlipidemia 5. Type 2 diabetes  Recommendations: 1. Agree with overall therapy. 2. Recommend adding Ranexa 500 mg BID for chest pain 3. Recommend more aggressive treatment of GERD 4. Defer stress test or cardiac catheterization as chest pain does not sound cardiac in nature and patient just recently had both    Signed: Clabe Seal, PA-C 12/24/2016, 8:31 AM

## 2016-12-24 NOTE — Discharge Instructions (Signed)
Heart healthy diet

## 2016-12-25 LAB — HIV ANTIBODY (ROUTINE TESTING W REFLEX): HIV Screen 4th Generation wRfx: NONREACTIVE

## 2016-12-27 ENCOUNTER — Ambulatory Visit: Payer: Self-pay | Admitting: Family Medicine

## 2016-12-27 ENCOUNTER — Ambulatory Visit: Payer: Medicare Other | Admitting: Family Medicine

## 2017-01-03 ENCOUNTER — Encounter: Payer: Self-pay | Admitting: Family Medicine

## 2017-01-03 ENCOUNTER — Ambulatory Visit (INDEPENDENT_AMBULATORY_CARE_PROVIDER_SITE_OTHER): Payer: Medicare Other | Admitting: Family Medicine

## 2017-01-03 VITALS — BP 128/72 | Temp 98.5°F | Resp 16 | Wt 165.0 lb

## 2017-01-03 DIAGNOSIS — I10 Essential (primary) hypertension: Secondary | ICD-10-CM

## 2017-01-03 DIAGNOSIS — E1159 Type 2 diabetes mellitus with other circulatory complications: Secondary | ICD-10-CM | POA: Diagnosis not present

## 2017-01-03 DIAGNOSIS — I200 Unstable angina: Secondary | ICD-10-CM | POA: Diagnosis not present

## 2017-01-03 DIAGNOSIS — F418 Other specified anxiety disorders: Secondary | ICD-10-CM

## 2017-01-03 DIAGNOSIS — I25708 Atherosclerosis of coronary artery bypass graft(s), unspecified, with other forms of angina pectoris: Secondary | ICD-10-CM | POA: Diagnosis not present

## 2017-01-03 NOTE — Progress Notes (Signed)
Patient: Candace [name] Female    DOB: [birth year]   66 y.o.   MRN: [id number] Visit Date: 01/03/2017  Today's Provider: [name], MD   Chief Complaint  Patient presents with  . Hospitalization Follow-up  . Depression  . Headache   Subjective:    HPI  Follow Up ER Visit  Patient is here for ER follow up.  She was recently seen at [facility] for chest pain on 12/23/2016. She was discharged on 12/24/2016.  Treatment for this included addine Renexa per Dr. Lorinda Creed (cardiology). She reports satisfactory compliance with treatment. She reports this condition is Unchanged.   Patient reports that she is tolerating the new medication well. However, she still has headaches and her anxiety has gotten worse.  She reports that she is having panic attacks more often, and its mainly when she is out in crowds. She reports that it has gotten so bad that it has made her very short of breath. She also reports that she can not sleep at night due to having severe headaches, but this has been ongoing for several months.     Allergies  Allergen Reactions  . Penicillins Swelling, Rash and Other (See Comments)    Has patient had a PCN reaction causing immediate rash, facial/tongue/throat swelling, SOB or lightheadedness with hypotension: Yes Has patient had a PCN reaction causing severe rash involving mucus membranes or skin necrosis: Yes Has patient had a PCN reaction that required hospitalization No Has patient had a PCN reaction occurring within the last 10 years: Yes If all of the above answers are "NO", then may proceed with Cephalosporin use.      Current Outpatient Prescriptions:  .  acetaminophen (TYLENOL) 500 MG tablet, Take 1,000 mg by mouth 2 (two) times daily., Disp: , Rfl:  .  alum & mag hydroxide-simeth (MAALOX/MYLANTA) 200-200-20 MG/5ML suspension, Take 30 mLs by mouth every 6 (six) hours as needed for indigestion or heartburn., Disp: 355 mL, Rfl: 0 .  aspirin 81 MG  chewable tablet, Chew 1 tablet (81 mg total) by mouth daily., Disp: 90 tablet, Rfl: 3 .  atorvastatin (LIPITOR) 40 MG tablet, Take 1 tablet (40 mg total) by mouth daily., Disp: 30 tablet, Rfl: 2 .  clopidogrel (PLAVIX) 75 MG tablet, Take 1 tablet (75 mg total) by mouth daily with breakfast., Disp: 90 tablet, Rfl: 1 .  diazepam (VALIUM) 2 MG tablet, Take 1 tablet by mouth 2 (two) times daily as needed., Disp: , Rfl:  .  docusate sodium (COLACE) 100 MG capsule, Take 100 mg by mouth daily as needed for mild constipation., Disp: , Rfl:  .  DULoxetine (CYMBALTA) 60 MG capsule, Take 1 capsule (60 mg total) by mouth daily., Disp: 30 capsule, Rfl: 2 .  isosorbide mononitrate (IMDUR) 30 MG 24 hr tablet, Take 2 tablets (60 mg total) by mouth daily., Disp: 60 tablet, Rfl: 12 .  metFORMIN (GLUCOPHAGE) 1000 MG tablet, Take 1 tablet (1,000 mg total) by mouth 2 (two) times daily with a meal., Disp: 180 tablet, Rfl: 3 .  metoprolol (LOPRESSOR) 50 MG tablet, Take 50 mg by mouth 2 (two) times daily. , Disp: , Rfl:  .  nitroGLYCERIN (NITROSTAT) 0.4 MG SL tablet, Place 1 tablet (0.4 mg total) under the tongue every 5 (five) minutes as needed for chest pain., Disp: 30 tablet, Rfl: 0 .  ondansetron (ZOFRAN ODT) 8 MG disintegrating tablet, Take 1 tablet (8 mg total) by mouth every 8 (eight) hours as  needed for nausea or vomiting., Disp: 30 tablet, Rfl: 3 .  ranolazine (RANEXA) 500 MG 12 hr tablet, Take 1 tablet (500 mg total) by mouth 2 (two) times daily., Disp: 60 tablet, Rfl: 0 .  Cholecalciferol (VITAMIN D) 2000 units CAPS, Take 1 capsule (2,000 Units total) by mouth daily. (Patient not taking: Reported on 12/23/2016), Disp: 30 capsule, Rfl:  .  gabapentin (NEURONTIN) 100 MG capsule, Take 1 capsule (100 mg total) by mouth at bedtime as needed. (Patient not taking: Reported on 12/23/2016), Disp: 30 capsule, Rfl: 2  Review of Systems  Constitutional: Positive for activity change and fatigue.  Eyes: Negative.     Respiratory: Positive for shortness of breath.   Cardiovascular: Positive for palpitations. Negative for chest pain and leg swelling.       Due to anxiety.   Gastrointestinal: Positive for nausea.  Musculoskeletal: Positive for arthralgias and myalgias.  Allergic/Immunologic: Negative.   Neurological: Positive for tremors, weakness, light-headedness and headaches. Negative for dizziness, syncope and numbness.  Psychiatric/Behavioral: Positive for agitation and sleep disturbance. Negative for self-injury and suicidal ideas. The patient is nervous/anxious.     Social History  Substance Use Topics  . Smoking status: Former Smoker    Packs/day: 0.50    Types: Cigarettes    Quit date: 10/07/2001  . Smokeless tobacco: Never Used  . Alcohol use No   Objective:   BP 128/72 (BP Location: Right Arm, Patient Position: Sitting, Cuff Size: Normal)   Temp 98.5 F (36.9 C)   Resp 16   Wt 165 lb (74.8 kg)   BMI 29.23 kg/m  Vitals:   01/03/17 1516  BP: 128/72  Resp: 16  Temp: 98.5 F (36.9 C)  Weight: 165 lb (74.8 kg)     Physical Exam  Constitutional: She is oriented to person, place, and time. She appears well-developed and well-nourished.  HENT:  Head: Normocephalic and atraumatic.  Right Ear: External ear normal.  Left Ear: External ear normal.  Nose: Nose normal.  Eyes: Conjunctivae are normal. No scleral icterus.  Neck: Normal range of motion. Neck supple. No thyromegaly present.  Cardiovascular: Normal rate, regular rhythm and normal heart sounds.   Pulmonary/Chest: Effort normal and breath sounds normal.  Abdominal: Soft.  Neurological: She is alert and oriented to person, place, and time.  Skin: Skin is warm and dry.  Psychiatric: She has a normal mood and affect. Her behavior is normal. Judgment and thought content normal.        Assessment & Plan:     CAD Per cardiology.More than 50% of 30 minutevisit spent in counseling Headache Will try to call neurology. I  have nothing to offer  TIIDM HTN HLD Anxiety/Depression  I have done the exam and reviewed the chart and it is accurate to the best of my knowledge. Development worker, community has been used and  any errors in dictation or transcription are unintentional. Miguel Aschoff M.D. [place], MD  [facility]

## 2017-01-03 NOTE — Patient Instructions (Signed)
Take Tylenol 3 times daily.

## 2017-01-19 ENCOUNTER — Telehealth: Payer: Self-pay | Admitting: Family Medicine

## 2017-01-19 DIAGNOSIS — G44229 Chronic tension-type headache, not intractable: Secondary | ICD-10-CM

## 2017-01-19 DIAGNOSIS — I25708 Atherosclerosis of coronary artery bypass graft(s), unspecified, with other forms of angina pectoris: Secondary | ICD-10-CM

## 2017-01-19 DIAGNOSIS — Z951 Presence of aortocoronary bypass graft: Secondary | ICD-10-CM

## 2017-01-19 NOTE — Telephone Encounter (Signed)
Pt's daughter Dava is requesting a call back for an update on forms she brought into the office for Dr. Rosanna Randy to complete so pt could help and a Education officer, museum. Dava stated that the forms are from [facility] and that she had brought the forms to the office in September 2018. Please advise. Thanks TNP

## 2017-01-19 NOTE — Telephone Encounter (Signed)
Please review. I do remember seen them last month at some point for you to review.thank Edrick Kins, RMA

## 2017-01-25 NOTE — Telephone Encounter (Signed)
Form filled out. [name] daughter Harolyn Rutherford, RMA [name] states she is having hard time with her mother-the patient. Patient is not taking her medications like she needs to, she is not able to care for herself and is having issues but patient does not see that anything is wrong. Patient getting frustrated with her own health and that things are not rapidly improving. Daughter states she is worn out with trying to take care of her mother and working and wold like to get Education officer, museum involved to help. Per dr Rosanna Randy ok to do referral-Anastasiya V Hopkins, RMA

## 2017-02-07 ENCOUNTER — Ambulatory Visit (INDEPENDENT_AMBULATORY_CARE_PROVIDER_SITE_OTHER): Payer: Medicare Other | Admitting: Family Medicine

## 2017-02-07 VITALS — BP 154/82 | HR 84 | Temp 98.1°F | Resp 14 | Wt 171.0 lb

## 2017-02-07 DIAGNOSIS — I25708 Atherosclerosis of coronary artery bypass graft(s), unspecified, with other forms of angina pectoris: Secondary | ICD-10-CM | POA: Diagnosis not present

## 2017-02-07 DIAGNOSIS — G44229 Chronic tension-type headache, not intractable: Secondary | ICD-10-CM

## 2017-02-07 DIAGNOSIS — E1159 Type 2 diabetes mellitus with other circulatory complications: Secondary | ICD-10-CM

## 2017-02-07 DIAGNOSIS — Z951 Presence of aortocoronary bypass graft: Secondary | ICD-10-CM | POA: Diagnosis not present

## 2017-02-07 DIAGNOSIS — I10 Essential (primary) hypertension: Secondary | ICD-10-CM | POA: Diagnosis not present

## 2017-02-07 DIAGNOSIS — J0181 Other acute recurrent sinusitis: Secondary | ICD-10-CM | POA: Diagnosis not present

## 2017-02-07 DIAGNOSIS — D519 Vitamin B12 deficiency anemia, unspecified: Secondary | ICD-10-CM

## 2017-02-07 MED ORDER — HYDROCHLOROTHIAZIDE 25 MG PO TABS: 25.0000 mg | ORAL_TABLET | Freq: Every day | ORAL | 3 refills | Status: DC

## 2017-02-07 MED ORDER — SULFAMETHOXAZOLE-TRIMETHOPRIM 400-80 MG PO TABS: 1.0000 | ORAL_TABLET | Freq: Two times a day (BID) | ORAL | 0 refills | Status: DC

## 2017-02-07 MED ORDER — RANOLAZINE ER 500 MG PO TB12: 500.0000 mg | ORAL_TABLET | Freq: Two times a day (BID) | ORAL | 5 refills | Status: DC

## 2017-02-07 NOTE — Progress Notes (Signed)
Candace [name]  MRN: [id number] DOB: July 10, 1950  Subjective:  HPI   Patient is here for follow up. Last office visit was on 01/03/17 for ER follow up. Patient is having headaches and would like to discuss getting pain medication for this. Patient is not taking all of the medications on her list. She is taking Aspirin, Metoprolol, Metformin.  Patient Active Problem List   Diagnosis Date Noted  . B12 deficiency 12/01/2016  . Vitamin D deficiency 11/04/2016  . Depression with anxiety 09/30/2016  . PAF (paroxysmal atrial fibrillation) ([place]) 09/30/2016  . Chronic fatigue 09/30/2016  . Resting tremor 09/30/2016  . Nausea & vomiting 09/30/2016  . Mild obstructive sleep apnea 09/30/2016  . Headache 08/18/2016  . Abnormal stress test 08/05/2016  . Unstable angina ([place]) 08/03/2016  . Diabetes ([place]) 08/03/2016  . Snoring 02/13/2016  . Bradycardia 12/31/2015  . Asthma 11/11/2015  . S/P CABG x 4 11/10/2015  . Coronary artery disease 11/07/2015  . Carotid artery narrowing 02/08/2014  . Chest pain 02/08/2014  . Mixed hyperlipidemia 02/08/2014  . Atypical chest pain 02/07/2014  . Essential (primary) hypertension 02/07/2014  . Awareness of heartbeats 02/07/2014  . Abdominal pain 10/05/2013  . D (diarrhea) 10/05/2013  . Acid reflux 10/05/2013    Past Medical History:  Diagnosis Date  . Allergy   . Anemia   . Anxiety   . Arrhythmia   . Arthritis   . Coronary artery disease   . Depression   . Diabetes mellitus without complication ([place])   . Dyspnea    doe  . Dysrhythmia   . GERD (gastroesophageal reflux disease)   . Headache   . Hyperlipidemia   . Hypertension   . Myocardial infarction ([place])    2016  . Myocardial infarction with cardiac rehabilitation [facility])    MI 2016/ CABG 8/17    FINISHED CARDIAC REHAB 3 WEEKS AGO  . Panic attack   . Reflux   . Stroke ([place])   . Voice tremor     Social History   Socioeconomic History  . Marital status: Divorced    Spouse name:  Not on file  . Number of children: Not on file  . Years of education: Not on file  . Highest education level: Not on file  Social Needs  . Financial resource strain: Not on file  . Food insecurity - worry: Not on file  . Food insecurity - inability: Not on file  . Transportation needs - medical: Not on file  . Transportation needs - non-medical: Not on file  Occupational History  . Not on file  Tobacco Use  . Smoking status: Former Smoker    Packs/day: 0.50    Types: Cigarettes    Last attempt to quit: 10/07/2001    Years since quitting: 15.3  . Smokeless tobacco: Never Used  Substance and Sexual Activity  . Alcohol use: No    Alcohol/week: 0.0 oz  . Drug use: No  . Sexual activity: Not Currently  Other Topics Concern  . Not on file  Social History Narrative  . Not on file    Outpatient Encounter Medications as of 02/07/2017  Medication Sig Note  . aspirin 81 MG chewable tablet Chew 1 tablet (81 mg total) by mouth daily.   . metFORMIN (GLUCOPHAGE) 1000 MG tablet Take 1 tablet (1,000 mg total) by mouth 2 (two) times daily with a meal.   . metoprolol (LOPRESSOR) 50 MG tablet Take 50 mg by mouth 2 (two) times daily.    Marland Kitchen  acetaminophen (TYLENOL) 500 MG tablet Take 1,000 mg by mouth 2 (two) times daily.   Marland Kitchen alum & mag hydroxide-simeth (MAALOX/MYLANTA) 200-200-20 MG/5ML suspension Take 30 mLs by mouth every 6 (six) hours as needed for indigestion or heartburn. (Patient not taking: Reported on 02/07/2017)   . atorvastatin (LIPITOR) 40 MG tablet Take 1 tablet (40 mg total) by mouth daily. (Patient not taking: Reported on 02/07/2017)   . Cholecalciferol (VITAMIN D) 2000 units CAPS Take 1 capsule (2,000 Units total) by mouth daily. (Patient not taking: Reported on 02/07/2017) 10/11/2016: Patient has never started   . clopidogrel (PLAVIX) 75 MG tablet Take 1 tablet (75 mg total) by mouth daily with breakfast. (Patient not taking: Reported on 02/07/2017)   . diazepam (VALIUM) 2 MG tablet  Take 1 tablet by mouth 2 (two) times daily as needed.   . docusate sodium (COLACE) 100 MG capsule Take 100 mg by mouth daily as needed for mild constipation.   . DULoxetine (CYMBALTA) 60 MG capsule Take 1 capsule (60 mg total) by mouth daily. (Patient not taking: Reported on 02/07/2017)   . gabapentin (NEURONTIN) 100 MG capsule Take 1 capsule (100 mg total) by mouth at bedtime as needed. (Patient not taking: Reported on 02/07/2017)   . hydrochlorothiazide (HYDRODIURIL) 25 MG tablet Take 1 tablet (25 mg total) daily by mouth.   . isosorbide mononitrate (IMDUR) 30 MG 24 hr tablet Take 2 tablets (60 mg total) by mouth daily. (Patient not taking: Reported on 02/07/2017)   . nitroGLYCERIN (NITROSTAT) 0.4 MG SL tablet Place 1 tablet (0.4 mg total) under the tongue every 5 (five) minutes as needed for chest pain. (Patient not taking: Reported on 02/07/2017)   . ondansetron (ZOFRAN ODT) 8 MG disintegrating tablet Take 1 tablet (8 mg total) by mouth every 8 (eight) hours as needed for nausea or vomiting. (Patient not taking: Reported on 02/07/2017)   . ranolazine (RANEXA) 500 MG 12 hr tablet Take 1 tablet (500 mg total) 2 (two) times daily by mouth.   . [DISCONTINUED] ranolazine (RANEXA) 500 MG 12 hr tablet Take 1 tablet (500 mg total) by mouth 2 (two) times daily. (Patient not taking: Reported on 02/07/2017)    No facility-administered encounter medications on file as of 02/07/2017.     Allergies  Allergen Reactions  . Lisinopril     cough  . Penicillins Swelling, Rash and Other (See Comments)    Has patient had a PCN reaction causing immediate rash, facial/tongue/throat swelling, SOB or lightheadedness with hypotension: Yes Has patient had a PCN reaction causing severe rash involving mucus membranes or skin necrosis: Yes Has patient had a PCN reaction that required hospitalization No Has patient had a PCN reaction occurring within the last 10 years: Yes If all of the above answers are "NO", then may  proceed with Cephalosporin use.     Review of Systems  Eyes: Negative.   Respiratory: Positive for shortness of breath (at times, even with sitting and realxing not exerting.).   Cardiovascular: Positive for chest pain.  Gastrointestinal: Negative.   Genitourinary: Negative.   Musculoskeletal: Positive for back pain and falls (in the last month.).  Skin: Negative.   Neurological: Positive for dizziness, weakness (sometimes) and headaches.  Endo/Heme/Allergies: Negative.   Psychiatric/Behavioral: The patient is nervous/anxious.     Objective:  BP (!) 154/82   Pulse 84   Temp 98.1 F (36.7 C)   Resp 14   Wt 171 lb (77.6 kg)   BMI 30.29 kg/m   Physical  Exam  Constitutional: She is oriented to person, place, and time and well-developed, well-nourished, and in no distress.  HENT:  Head: Normocephalic and atraumatic.  Nose: Right sinus exhibits maxillary sinus tenderness and frontal sinus tenderness. Left sinus exhibits maxillary sinus tenderness and frontal sinus tenderness.  Eyes: Conjunctivae are normal. No scleral icterus.  Neck: No thyromegaly present.  Cardiovascular: Normal rate, regular rhythm, normal heart sounds and intact distal pulses. Exam reveals no gallop.  No murmur heard. Pulmonary/Chest: Effort normal and breath sounds normal. No respiratory distress. She has no wheezes.  Abdominal: Soft.  Neurological: She is alert and oriented to person, place, and time.  Skin: Skin is warm and dry.  Psychiatric: Mood, memory, affect and judgment normal.    Assessment and Plan :  1. Coronary artery disease of bypass graft of native heart with stable angina pectoris ([place]) Refer to social worker to help at home. More than 50% of 25 minute visit spent in counseling. - Ambulatory referral to Social Work  2. Chronic tension-type headache, not intractable Patient advised Dr Jay-neurologist- will follow and treat headaches. All meds per Dr Ulice Dash for headaches. - Ambulatory  referral to Social Work  3. S/P CABG x 4 Discussed medications today and what to take. Patient advised to re start Ranexa, this could help chest pain. Continue Aspirin 81 mg. Will hold off on Plavix. - Ambulatory referral to Social Work  4. Type 2 diabetes mellitus with other circulatory complication, without long-term current use of insulin ([place]) - Ambulatory referral to Social Work Will also consider re starting statin. Patient advised to continue follow up with cardiologist. 5. Essential (primary) hypertension Elevated. Patient had cough with Lisinopril. Re start HCTZ. Re check on the next visit.  6. Anemia due to vitamin B12 deficiency, unspecified B12 deficiency type  HPI, Exam and A&P transcribed by Tiffany Kocher, RMA under direction and in the presence of Miguel Aschoff, MD. I have done the exam and reviewed the chart and it is accurate to the best of my knowledge. Development worker, community has been used and  any errors in dictation or transcription are unintentional. Miguel Aschoff M.D. [facility]

## 2017-02-07 NOTE — Patient Instructions (Signed)
Start taking Hydrochlorothiazide, Ranexa. Start Septra for sinus infection

## 2017-02-08 ENCOUNTER — Telehealth: Payer: Self-pay | Admitting: Family Medicine

## 2017-02-08 NOTE — Telephone Encounter (Signed)
Can I get more details as to what is needed for referral for social work,Thanks

## 2017-02-08 NOTE — Telephone Encounter (Signed)
Spoke with Daughter Clarise Cruz [phone]). Needs help for patient with bathing, cooking, medication management-making sure right medication is been taking, transportation. Patient does have tremors at times and spills like water for example, etc. Let me know if you have any other questions.-Jahniyah Revere V Jontrell Bushong, RMA

## 2017-02-09 ENCOUNTER — Ambulatory Visit
Admission: RE | Admit: 2017-02-09 | Discharge: 2017-02-09 | Disposition: A | Discharge status: Home / Self Care | Payer: Medicare Other | Source: Ambulatory Visit | Attending: Family Medicine | Admitting: Family Medicine

## 2017-02-09 DIAGNOSIS — J0181 Other acute recurrent sinusitis: Secondary | ICD-10-CM | POA: Diagnosis present

## 2017-02-10 NOTE — Telephone Encounter (Signed)
I spoke with Carlean Purl from [place] who states she will get information to their assistive care department.

## 2017-02-14 ENCOUNTER — Encounter: Payer: Self-pay | Admitting: Pulmonary Disease

## 2017-02-14 ENCOUNTER — Ambulatory Visit (INDEPENDENT_AMBULATORY_CARE_PROVIDER_SITE_OTHER): Payer: Medicare Other | Admitting: Pulmonary Disease

## 2017-02-14 DIAGNOSIS — J4520 Mild intermittent asthma, uncomplicated: Secondary | ICD-10-CM | POA: Diagnosis not present

## 2017-02-14 DIAGNOSIS — G4733 Obstructive sleep apnea (adult) (pediatric): Secondary | ICD-10-CM

## 2017-02-14 NOTE — Progress Notes (Signed)
   Subjective:    Patient ID: Candace [name], female    DOB: December 07, 1950, 66 y.o.   MRN: [id number]  HPI  66 year old remote smoker for FU of OSA  S/p urgentCABG 11/10/15 for 3V -CAD  She developed bronchospasm postoperatively that responded well to steroids.She was a remote smoker,less than half pack per day-less than 20 pack years.  One year follow-up. Daughter accompanies today She has had a difficult year due to multiple issues - Had recurrent chest pain-and eventually needed stent to bypass graft.  -Chronic headaches, was treated with high-dose steroids for temporal arteritis, biopsy was negative, has seen neurologist at [facility], imaging negative  -Facial swelling with seems to come and go, not clearly related to steroids, had allergy testing  I see that her weight has increased from 165 pounds last year to currently 173 pounds. We reviewed PSG results and she is hesitant to try a CPAP machine She also reports insomnia, I see diazepam on her medication list which she has taken only occasionally  Daughter has observed her to be short of breath, no wheezing or cough She reports episodes of shortness of breath and tremors which seem to come on and subside spontaneously    Significant tests/ events reviewed   CT chest 04/2014 shows no evidence of emphysema or fibrosis Spirometry 01/2016 FEV1 85%, Normal ratio, 13% improvement with bronchodilator to 97% 03/2016 PSG >> AHI 10/h, PLMs ++ , PLM arousal 6/h   Past Medical History:  Diagnosis Date  . Allergy   . Anemia   . Anxiety   . Arrhythmia   . Arthritis   . Coronary artery disease   . Depression   . Diabetes mellitus without complication ([place])   . Dyspnea    doe  . Dysrhythmia   . GERD (gastroesophageal reflux disease)   . Headache   . Hyperlipidemia   . Hypertension   . Myocardial infarction ([place])    2016  . Myocardial infarction with cardiac rehabilitation [facility])    MI 2016/ CABG 8/17    FINISHED CARDIAC  REHAB 3 WEEKS AGO  . Panic attack   . Reflux   . Stroke ([place])   . Voice tremor     Review of Systems neg for any significant sore throat, dysphagia, itching, sneezing, nasal congestion or excess/ purulent secretions, fever, chills, sweats, unintended wt loss, pleuritic or exertional cp, hempoptysis, orthopnea pnd or change in chronic leg swelling.   Also denies presyncope, palpitations, heartburn, abdominal pain, nausea, vomiting, diarrhea or change in bowel or urinary habits, dysuria,hematuria, rash, arthralgias, visual complaints, headache, numbness weakness or ataxia.     Objective:   Physical Exam   Gen. Pleasant, well-nourished, in no distress ENT - no thrush, no post nasal drip Neck: No JVD, no thyromegaly, no carotid bruits Lungs: no use of accessory muscles, no dullness to percussion, clear without rales or rhonchi  Cardiovascular: Rhythm regular, heart sounds  normal, no murmurs or gallops, no peripheral edema Musculoskeletal: No deformities, no cyanosis or clubbing          Assessment & Plan:

## 2017-02-14 NOTE — Patient Instructions (Signed)
Trial of melatonin 5-10 mg 2h before bedtime If this does not work, call back in 2 weeks for trial of CPAP

## 2017-02-15 NOTE — Assessment & Plan Note (Signed)
Trial of melatonin 5-10 mg 2h before bedtime for insomnia If this does not work, call back in 2 weeks for trial of CPAP -I discussed that the cardiovascular effect of mild OSA is negligible with a reason to treat her would be that she has multiple unexplained symptoms which have not improved with therapy including headaches

## 2017-02-15 NOTE — Assessment & Plan Note (Signed)
Spirometry on last visit had shown good lung function. Use albuterol on an as-needed basis only

## 2017-02-24 ENCOUNTER — Other Ambulatory Visit: Payer: Self-pay | Admitting: Family Medicine

## 2017-02-24 ENCOUNTER — Telehealth: Payer: Self-pay | Admitting: Family Medicine

## 2017-02-24 DIAGNOSIS — B379 Candidiasis, unspecified: Secondary | ICD-10-CM

## 2017-02-24 MED ORDER — FLUCONAZOLE 150 MG PO TABS: 150.0000 mg | ORAL_TABLET | Freq: Once | ORAL | 0 refills | Status: AC

## 2017-02-24 NOTE — Telephone Encounter (Signed)
Pt's daughter [name] stated that she believes that because of pt taking sulfamethoxazole-trimethoprim (BACTRIM) 400-80 MG tablet it has caused her to have a yeast infection. [name] is requesting a Rx for yeast infection be sent to [facility] [address] [name] was advised Dr. Rosanna Randy is out of the office the rest of the week, [name] request to see if another provider would call in the Rx. Please advise. Thanks TNP

## 2017-02-24 NOTE — Telephone Encounter (Signed)
Please review for Dr Rosanna Randy thank Edrick Kins, RMA

## 2017-02-24 NOTE — Telephone Encounter (Signed)
Diflucan sent in

## 2017-02-25 ENCOUNTER — Telehealth: Payer: Self-pay | Admitting: Family Medicine

## 2017-02-25 DIAGNOSIS — B379 Candidiasis, unspecified: Secondary | ICD-10-CM

## 2017-02-25 DIAGNOSIS — T3695XA Adverse effect of unspecified systemic antibiotic, initial encounter: Principal | ICD-10-CM

## 2017-02-25 MED ORDER — FLUCONAZOLE 150 MG PO TABS: 150.0000 mg | ORAL_TABLET | Freq: Once | ORAL | 0 refills | Status: AC

## 2017-02-25 NOTE — Telephone Encounter (Signed)
Candace [name] approved fluconazole (DIFLUCAN) 150 MG tablet Yesterday b/c Dr. Rosanna Randy is out of the office but Rx was sent to [facility] that is no longer open. Pt's daughter [name] had requested the medication be sent to [facility] [address]. [name] request it to be resent today if possible. Please advise. Thanks TNP

## 2017-02-25 NOTE — Telephone Encounter (Signed)
Please Review.  Thanks,  -Tiwan Schnitker 

## 2017-02-25 NOTE — Telephone Encounter (Signed)
Sent!

## 2017-03-24 ENCOUNTER — Ambulatory Visit (INDEPENDENT_AMBULATORY_CARE_PROVIDER_SITE_OTHER): Payer: Medicare Other | Admitting: Family Medicine

## 2017-03-24 VITALS — BP 122/48 | HR 88 | Temp 98.2°F | Resp 18 | Wt 173.0 lb

## 2017-03-24 DIAGNOSIS — F418 Other specified anxiety disorders: Secondary | ICD-10-CM

## 2017-03-24 DIAGNOSIS — N898 Other specified noninflammatory disorders of vagina: Secondary | ICD-10-CM | POA: Diagnosis not present

## 2017-03-24 DIAGNOSIS — E1159 Type 2 diabetes mellitus with other circulatory complications: Secondary | ICD-10-CM | POA: Diagnosis not present

## 2017-03-24 DIAGNOSIS — D649 Anemia, unspecified: Secondary | ICD-10-CM | POA: Diagnosis not present

## 2017-03-24 DIAGNOSIS — R1030 Lower abdominal pain, unspecified: Secondary | ICD-10-CM | POA: Diagnosis not present

## 2017-03-24 DIAGNOSIS — I25708 Atherosclerosis of coronary artery bypass graft(s), unspecified, with other forms of angina pectoris: Secondary | ICD-10-CM | POA: Diagnosis not present

## 2017-03-24 DIAGNOSIS — R102 Pelvic and perineal pain: Secondary | ICD-10-CM | POA: Diagnosis not present

## 2017-03-24 LAB — POCT URINALYSIS DIPSTICK
Bilirubin, UA: NEGATIVE
Glucose, UA: NEGATIVE
Leukocytes, UA: NEGATIVE
Nitrite, UA: NEGATIVE
Spec Grav, UA: 1.025 (ref 1.010–1.025)
Urobilinogen, UA: NEGATIVE E.U./dL — AB
pH, UA: 6 (ref 5.0–8.0)

## 2017-03-24 LAB — POCT WET PREP (WET MOUNT)

## 2017-03-24 LAB — IFOBT (OCCULT BLOOD): IFOBT: NEGATIVE

## 2017-03-24 MED ORDER — ROSUVASTATIN CALCIUM 10 MG PO TABS: 10.0000 mg | ORAL_TABLET | Freq: Every day | ORAL | 3 refills | Status: DC

## 2017-03-24 NOTE — Progress Notes (Signed)
Candace [name]  MRN: [id number] DOB: February 11, 1951  Subjective:  HPI  The patient is a 66 year old female who presents for follow up of chronic illness.  She was last seen on 02/10/17.  Diabetes-The patient had her last A1C on 12/23/16 and it was 7.3.  On her last visit it was discussed that she consider going back on her statin.  She was instructed to follow up for this with cardiology.  Patient had not been started on a statin.    The patient was seen by cardiology on 03/14/17 but not as her follow up.   She was seen after having episodes of palpitations.  She underwent cardiac work-up.  She has follow up appointment to get those results next week.    Hypertension-during her last visit her blood pressure was elevated and she was started on HCTZ  Patient states that her pain is not as bad but still present and still bad.  She is referring to her headaches and chest pain.    Patient states she is now having some lower abdominal/pelvic pain.  She had some discharge around Thanksgiving after being on an antibiotic.  She was given Diflucan and this improved the d/c.  Last week she began having a discharge again.  She states that the d/c both times was dark brown.  Last time there was a bad odor to it but not this time.   Anemia-the patient has history of B12 deficiency/anemia.  She is not currently on B12 or iron supplement.  Patient's daughter told me that the patient was abusing her Aspirin.  Patient admits that she takes up to 8 per day.  Patient Active Problem List   Diagnosis Date Noted  . B12 deficiency 12/01/2016  . Vitamin D deficiency 11/04/2016  . Depression with anxiety 09/30/2016  . PAF (paroxysmal atrial fibrillation) ([place]) 09/30/2016  . Chronic fatigue 09/30/2016  . Resting tremor 09/30/2016  . Nausea & vomiting 09/30/2016  . Mild obstructive sleep apnea 09/30/2016  . Headache 08/18/2016  . Abnormal stress test 08/05/2016  . Unstable angina ([place]) 08/03/2016  . Diabetes  ([place]) 08/03/2016  . Snoring 02/13/2016  . Bradycardia 12/31/2015  . Asthma 11/11/2015  . S/P CABG x 4 11/10/2015  . Coronary artery disease 11/07/2015  . Carotid artery narrowing 02/08/2014  . Chest pain 02/08/2014  . Mixed hyperlipidemia 02/08/2014  . Atypical chest pain 02/07/2014  . Essential (primary) hypertension 02/07/2014  . Awareness of heartbeats 02/07/2014  . Abdominal pain 10/05/2013  . D (diarrhea) 10/05/2013  . Acid reflux 10/05/2013    Past Medical History:  Diagnosis Date  . Allergy   . Anemia   . Anxiety   . Arrhythmia   . Arthritis   . Coronary artery disease   . Depression   . Diabetes mellitus without complication ([place])   . Dyspnea    doe  . Dysrhythmia   . GERD (gastroesophageal reflux disease)   . Headache   . Hyperlipidemia   . Hypertension   . Myocardial infarction ([place])    2016  . Myocardial infarction with cardiac rehabilitation [facility])    MI 2016/ CABG 8/17    FINISHED CARDIAC REHAB 3 WEEKS AGO  . Panic attack   . Reflux   . Stroke ([place])   . Voice tremor     Social History   Socioeconomic History  . Marital status: Divorced    Spouse name: Not on file  . Number of children: Not on file  .  Years of education: Not on file  . Highest education level: Not on file  Social Needs  . Financial resource strain: Not on file  . Food insecurity - worry: Not on file  . Food insecurity - inability: Not on file  . Transportation needs - medical: Not on file  . Transportation needs - non-medical: Not on file  Occupational History  . Not on file  Tobacco Use  . Smoking status: Former Smoker    Packs/day: 0.50    Types: Cigarettes    Last attempt to quit: 10/07/2001    Years since quitting: 15.4  . Smokeless tobacco: Never Used  Substance and Sexual Activity  . Alcohol use: No    Alcohol/week: 0.0 oz  . Drug use: No  . Sexual activity: Not Currently  Other Topics Concern  . Not on file  Social History Narrative  . Not on file     Outpatient Encounter Medications as of 03/24/2017  Medication Sig  . aspirin 81 MG chewable tablet Chew 1 tablet (81 mg total) by mouth daily.  . diazepam (VALIUM) 2 MG tablet Take 1 tablet by mouth 2 (two) times daily as needed.  . docusate sodium (COLACE) 100 MG capsule Take 100 mg by mouth daily as needed for mild constipation.  . hydrochlorothiazide (HYDRODIURIL) 25 MG tablet Take 1 tablet (25 mg total) daily by mouth.  . isosorbide mononitrate (IMDUR) 30 MG 24 hr tablet Take 2 tablets (60 mg total) by mouth daily.  . metFORMIN (GLUCOPHAGE) 1000 MG tablet Take 1 tablet (1,000 mg total) by mouth 2 (two) times daily with a meal.  . metoprolol tartrate (LOPRESSOR) 50 MG tablet TAKE 1 TABLET BY MOUTH TWICE A DAY FOR HIGH BLOOD PRESSURE  . nitroGLYCERIN (NITROSTAT) 0.4 MG SL tablet Place 1 tablet (0.4 mg total) under the tongue every 5 (five) minutes as needed for chest pain.  . ranolazine (RANEXA) 500 MG 12 hr tablet Take 1 tablet (500 mg total) 2 (two) times daily by mouth.  . [DISCONTINUED] sulfamethoxazole-trimethoprim (BACTRIM) 400-80 MG tablet Take 1 tablet 2 (two) times daily by mouth.   No facility-administered encounter medications on file as of 03/24/2017.     Allergies  Allergen Reactions  . Lisinopril     cough  . Penicillins Swelling, Rash and Other (See Comments)    Has patient had a PCN reaction causing immediate rash, facial/tongue/throat swelling, SOB or lightheadedness with hypotension: Yes Has patient had a PCN reaction causing severe rash involving mucus membranes or skin necrosis: Yes Has patient had a PCN reaction that required hospitalization No Has patient had a PCN reaction occurring within the last 10 years: Yes If all of the above answers are "NO", then may proceed with Cephalosporin use.     Review of Systems  Constitutional: Positive for malaise/fatigue. Negative for fever.  HENT: Negative.   Eyes: Negative.   Respiratory: Positive for shortness of  breath. Negative for cough and wheezing.   Cardiovascular: Positive for chest pain, palpitations and claudication. Negative for orthopnea and leg swelling.  Gastrointestinal: Positive for abdominal pain, diarrhea, nausea and vomiting. Negative for blood in stool, constipation and heartburn.  Genitourinary: Negative for dysuria.  Skin: Negative.   Neurological: Positive for weakness and headaches.  Endo/Heme/Allergies: Negative.   Psychiatric/Behavioral: The patient is nervous/anxious.     Objective:  BP (!) 122/48 (BP Location: Right Arm, Patient Position: Sitting, Cuff Size: Small)   Pulse 88   Temp 98.2 F (36.8 C) (Oral)   Resp  18   Wt 173 lb (78.5 kg)   BMI 30.65 kg/m   Physical Exam  Constitutional: She is oriented to person, place, and time and well-developed, well-nourished, and in no distress.  HENT:  Head: Normocephalic and atraumatic.  Right Ear: External ear normal.  Left Ear: External ear normal.  Nose: Nose normal.  Eyes: Conjunctivae are normal.  Neck: Neck supple. No thyromegaly present.  Cardiovascular: Normal rate, regular rhythm and normal heart sounds.  Pulmonary/Chest: Effort normal and breath sounds normal.  Abdominal: Soft.  Genitourinary: Vagina normal and cervix normal.  Genitourinary Comments: Small amount of normal vaginal discharge.  Lymphadenopathy:    She has no cervical adenopathy.  Neurological: She is alert and oriented to person, place, and time. Gait normal. GCS score is 15.  Skin: Skin is warm and dry.  Psychiatric: Mood, memory, affect and judgment normal.    Assessment and Plan :  1. Vaginal discharge - Pap IG (Image Guided); Future - Pap IG (Image Guided) - POCT Wet Prep [facility])  2. Anemia, unspecified type - IFOBT POC (occult bld, rslt in office); Future - IFOBT POC (occult bld, rslt in office) - Comprehensive metabolic panel - CBC with Differential/Platelet  3. Pelvic pain No etiology identified. May need [place] followed bu  CT if persists. - Pap IG (Image Guided); Future - Urine Culture; Future - Urine Culture - Pap IG (Image Guided) - POCT urinalysis dipstick  4. Type 2 diabetes mellitus with other circulatory complication, without long-term current use of insulin (HCC) - Hemoglobin A1c - Comprehensive metabolic panel Start Crestor 10 mg daily.  5.s/p CABG 6.Headache  7.HLD Rosuvastatin 10mg  daily. Per neurology. Pt has been taking 10-12 ASA daily which I told her to stop other than daily ASA 81mg . 8.Chronic Anxiety/Depression I hope pt will be willing to see psychiatry in future. HPI, Exam and A&P transcribed by Tiffany Kocher, RMA under direction and in the presence of Miguel Aschoff, MD. I have done the exam and reviewed the chart and it is accurate to the best of my knowledge. Development worker, community has been used and  any errors in dictation or transcription are unintentional. Miguel Aschoff M.D. [facility]

## 2017-03-25 LAB — HEMOGLOBIN A1C
Hgb A1c MFr Bld: 7.8 % of total Hgb — ABNORMAL HIGH (ref <5.7)
Mean Plasma Glucose: 177 (calc)
eAG (mmol/L): 9.8 (calc)

## 2017-03-25 LAB — COMPREHENSIVE METABOLIC PANEL
AG Ratio: 1.9 (calc) (ref 1.0–2.5)
ALT: 6 U/L (ref 6–29)
AST: 12 U/L (ref 10–35)
Albumin: 4.2 g/dL (ref 3.6–5.1)
Alkaline phosphatase (APISO): 92 U/L (ref 33–130)
BUN/Creatinine Ratio: 12 (calc) (ref 6–22)
BUN: 16 mg/dL (ref 7–25)
CO2: 32 mmol/L (ref 20–32)
Calcium: 9.5 mg/dL (ref 8.6–10.4)
Chloride: 101 mmol/L (ref 98–110)
Creat: 1.39 mg/dL — ABNORMAL HIGH (ref 0.50–0.99)
Globulin: 2.2 g/dL (calc) (ref 1.9–3.7)
Glucose, Bld: 148 mg/dL — ABNORMAL HIGH (ref 65–99)
Potassium: 3.9 mmol/L (ref 3.5–5.3)
Sodium: 145 mmol/L (ref 135–146)
Total Bilirubin: 0.2 mg/dL (ref 0.2–1.2)
Total Protein: 6.4 g/dL (ref 6.1–8.1)

## 2017-03-25 LAB — CBC WITH DIFFERENTIAL/PLATELET
Basophils Absolute: 40 cells/uL (ref 0–200)
Basophils Relative: 0.5 %
Eosinophils Absolute: 80 cells/uL (ref 15–500)
Eosinophils Relative: 1 %
HCT: 34.1 % — ABNORMAL LOW (ref 35.0–45.0)
Hemoglobin: 11.4 g/dL — ABNORMAL LOW (ref 11.7–15.5)
Lymphs Abs: 3200 cells/uL (ref 850–3900)
MCH: 31.6 pg (ref 27.0–33.0)
MCHC: 33.4 g/dL (ref 32.0–36.0)
MCV: 94.5 fL (ref 80.0–100.0)
MPV: 10.2 fL (ref 7.5–12.5)
Monocytes Relative: 8.1 %
Neutro Abs: 4032 cells/uL (ref 1500–7800)
Neutrophils Relative %: 50.4 %
Platelets: 289 10*3/uL (ref 140–400)
RBC: 3.61 10*6/uL — ABNORMAL LOW (ref 3.80–5.10)
RDW: 12.2 % (ref 11.0–15.0)
Total Lymphocyte: 40 %
WBC mixed population: 648 cells/uL (ref 200–950)
WBC: 8 10*3/uL (ref 3.8–10.8)

## 2017-03-25 LAB — URINE CULTURE
MICRO NUMBER:: 81452931
Result:: NO GROWTH
SPECIMEN QUALITY:: ADEQUATE

## 2017-03-28 LAB — PAP IG (IMAGE GUIDED)

## 2017-03-28 NOTE — Progress Notes (Signed)
Advised  ED 

## 2017-04-25 ENCOUNTER — Ambulatory Visit (INDEPENDENT_AMBULATORY_CARE_PROVIDER_SITE_OTHER): Payer: Medicare Other | Admitting: Family Medicine

## 2017-04-25 VITALS — BP 132/64 | HR 71 | Temp 98.5°F | Resp 16 | Wt 172.8 lb

## 2017-04-25 DIAGNOSIS — I25708 Atherosclerosis of coronary artery bypass graft(s), unspecified, with other forms of angina pectoris: Secondary | ICD-10-CM

## 2017-04-25 DIAGNOSIS — I2510 Atherosclerotic heart disease of native coronary artery without angina pectoris: Secondary | ICD-10-CM

## 2017-04-25 DIAGNOSIS — F418 Other specified anxiety disorders: Secondary | ICD-10-CM

## 2017-04-25 DIAGNOSIS — I10 Essential (primary) hypertension: Secondary | ICD-10-CM

## 2017-04-25 DIAGNOSIS — E1159 Type 2 diabetes mellitus with other circulatory complications: Secondary | ICD-10-CM

## 2017-04-25 DIAGNOSIS — M791 Myalgia, unspecified site: Secondary | ICD-10-CM | POA: Diagnosis not present

## 2017-04-25 DIAGNOSIS — G4733 Obstructive sleep apnea (adult) (pediatric): Secondary | ICD-10-CM | POA: Diagnosis not present

## 2017-04-25 DIAGNOSIS — E782 Mixed hyperlipidemia: Secondary | ICD-10-CM | POA: Diagnosis not present

## 2017-04-25 MED ORDER — CO Q 10 100 MG PO CAPS: 100.0000 mg | ORAL_CAPSULE | Freq: Every day | ORAL | 12 refills | Status: DC

## 2017-04-25 NOTE — Progress Notes (Signed)
Candace [name]  MRN: [id number] DOB: December 08, 1950  Subjective:  HPI  Patient is here for 1 month follow up. Last office visit was on 03/24/17. Hyperlipidemia/Diabetes: patient was started on Crestor 10 mg. Patient is having more muscle/joint pain. At that time pelvic pain was discussed and the following were performed: pap, Wet prep, UA, urine culture, lab work, Oc Lyte-all was normal. Patient is having some pelvic pain but not as intense and it is off and on. She is not sure if maybe it is lower abdomen issue, she gets cramping in the lower abdomen and has to have a bowel movement sometimes and sometimes not having one.  Patient is taking Tylenol 500 mg 2 tablets 2 to 3 times a week.  Patient Active Problem List   Diagnosis Date Noted  . B12 deficiency 12/01/2016  . Vitamin D deficiency 11/04/2016  . Depression with anxiety 09/30/2016  . PAF (paroxysmal atrial fibrillation) ([place]) 09/30/2016  . Chronic fatigue 09/30/2016  . Resting tremor 09/30/2016  . Nausea & vomiting 09/30/2016  . Mild obstructive sleep apnea 09/30/2016  . Headache 08/18/2016  . Abnormal stress test 08/05/2016  . Unstable angina ([place]) 08/03/2016  . Diabetes ([place]) 08/03/2016  . Snoring 02/13/2016  . Asthma 11/11/2015  . S/P CABG x 4 11/10/2015  . Coronary artery disease 11/07/2015  . Carotid artery narrowing 02/08/2014  . Chest pain 02/08/2014  . Mixed hyperlipidemia 02/08/2014  . Atypical chest pain 02/07/2014  . Essential (primary) hypertension 02/07/2014  . Awareness of heartbeats 02/07/2014  . Abdominal pain 10/05/2013  . D (diarrhea) 10/05/2013  . Acid reflux 10/05/2013    Past Medical History:  Diagnosis Date  . Allergy   . Anemia   . Anxiety   . Arrhythmia   . Arthritis   . Coronary artery disease   . Depression   . Diabetes mellitus without complication ([place])   . Dyspnea    doe  . Dysrhythmia   . GERD (gastroesophageal reflux disease)   . Headache   . Hyperlipidemia   .  Hypertension   . Myocardial infarction ([place])    2016  . Myocardial infarction with cardiac rehabilitation [facility])    MI 2016/ CABG 8/17    FINISHED CARDIAC REHAB 3 WEEKS AGO  . Panic attack   . Reflux   . Stroke ([place])   . Voice tremor     Social History   Socioeconomic History  . Marital status: Divorced    Spouse name: Not on file  . Number of children: Not on file  . Years of education: Not on file  . Highest education level: Not on file  Social Needs  . Financial resource strain: Not on file  . Food insecurity - worry: Not on file  . Food insecurity - inability: Not on file  . Transportation needs - medical: Not on file  . Transportation needs - non-medical: Not on file  Occupational History  . Not on file  Tobacco Use  . Smoking status: Former Smoker    Packs/day: 0.50    Types: Cigarettes    Last attempt to quit: 10/07/2001    Years since quitting: 15.5  . Smokeless tobacco: Never Used  Substance and Sexual Activity  . Alcohol use: No    Alcohol/week: 0.0 oz  . Drug use: No  . Sexual activity: Not Currently  Other Topics Concern  . Not on file  Social History Narrative  . Not on file    Outpatient Encounter Medications as  of 04/25/2017  Medication Sig  . aspirin 81 MG chewable tablet Chew 1 tablet (81 mg total) by mouth daily.  . diazepam (VALIUM) 2 MG tablet Take 1 tablet by mouth 2 (two) times daily as needed.  . hydrochlorothiazide (HYDRODIURIL) 25 MG tablet Take 1 tablet (25 mg total) daily by mouth.  . isosorbide mononitrate (IMDUR) 30 MG 24 hr tablet Take 2 tablets (60 mg total) by mouth daily.  Marland Kitchen loperamide (IMODIUM) 2 MG capsule Take by mouth as needed for diarrhea or loose stools.  . metFORMIN (GLUCOPHAGE) 1000 MG tablet Take 1 tablet (1,000 mg total) by mouth 2 (two) times daily with a meal.  . metoprolol tartrate (LOPRESSOR) 50 MG tablet TAKE 1 TABLET BY MOUTH TWICE A DAY FOR HIGH BLOOD PRESSURE  . nitroGLYCERIN (NITROSTAT) 0.4 MG SL tablet Place 1  tablet (0.4 mg total) under the tongue every 5 (five) minutes as needed for chest pain.  . rosuvastatin (CRESTOR) 10 MG tablet Take 1 tablet (10 mg total) by mouth daily.  Marland Kitchen tiZANidine (ZANAFLEX) 2 MG tablet Take 2 mg by mouth every 6 (six) hours as needed for muscle spasms (patient is to titrate up to 6 at bedtime).  . [DISCONTINUED] ranolazine (RANEXA) 500 MG 12 hr tablet Take 1 tablet (500 mg total) 2 (two) times daily by mouth.   No facility-administered encounter medications on file as of 04/25/2017.     Allergies  Allergen Reactions  . Lisinopril     cough  . Penicillins Swelling, Rash and Other (See Comments)    Has patient had a PCN reaction causing immediate rash, facial/tongue/throat swelling, SOB or lightheadedness with hypotension: Yes Has patient had a PCN reaction causing severe rash involving mucus membranes or skin necrosis: Yes Has patient had a PCN reaction that required hospitalization No Has patient had a PCN reaction occurring within the last 10 years: Yes If all of the above answers are "NO", then may proceed with Cephalosporin use.     Review of Systems  Constitutional: Positive for malaise/fatigue.  HENT:       Sneezing  Eyes: Negative.   Respiratory: Positive for cough and shortness of breath.   Cardiovascular: Positive for chest pain.  Gastrointestinal: Positive for abdominal pain.  Musculoskeletal: Positive for back pain, joint pain and myalgias.  Skin: Negative.   Neurological: Positive for dizziness, tingling, tremors, weakness and headaches.  Endo/Heme/Allergies: Negative.   Psychiatric/Behavioral: The patient has insomnia.     Objective:  BP 132/64   Pulse 71   Temp 98.5 F (36.9 C)   Resp 16   Wt 172 lb 12.8 oz (78.4 kg)   SpO2 98%   BMI 30.61 kg/m   Physical Exam  Constitutional: She is oriented to person, place, and time and well-developed, well-nourished, and in no distress.  HENT:  Head: Normocephalic and atraumatic.  Eyes:  Conjunctivae are normal. No scleral icterus.  Neck: No thyromegaly present.  Cardiovascular: Normal rate, regular rhythm and normal heart sounds.  Pulmonary/Chest: Effort normal and breath sounds normal.  Abdominal: Soft.  Neurological: She is alert and oriented to person, place, and time. Gait normal. GCS score is 15.  Skin: Skin is warm and dry.  Psychiatric: Mood, memory, affect and judgment normal.    Assessment and Plan :  1. Mixed hyperlipidemia  - Lipid Panel With LDL/HDL Ratio - Hepatic function panel  2. Coronary artery disease of bypass graft of native heart with stable angina pectoris ([place])   3. Myalgia  - CK -  Coenzyme Q10 (CO Q 10) 100 MG CAPS; Take 100 mg by mouth daily.  Dispense: 30 capsule; Refill: 12 4.CAD 5.Chronic Headaches Pt advised to limit meds due to overuse headaches. 6.Chronic Depression and Anxiety Major issue for this pt.Slowly improving.  I have done the exam and reviewed the chart and it is accurate to the best of my knowledge. Development worker, community has been used and  any errors in dictation or transcription are unintentional. Miguel Aschoff M.D. [facility]

## 2017-04-29 HISTORY — PX: CORONARY ANGIOPLASTY: SHX604

## 2017-05-03 ENCOUNTER — Other Ambulatory Visit: Payer: Self-pay | Admitting: Family Medicine

## 2017-05-09 ENCOUNTER — Telehealth: Payer: Self-pay | Admitting: Family Medicine

## 2017-05-09 NOTE — Telephone Encounter (Signed)
Patient is being discharged from [facility] on 05/09/2017 for dizziness.  She has a HFU  Scheduled for 05/19/2017 at 1:30pm.

## 2017-05-19 ENCOUNTER — Ambulatory Visit (INDEPENDENT_AMBULATORY_CARE_PROVIDER_SITE_OTHER): Payer: Medicare Other | Admitting: Family Medicine

## 2017-05-19 VITALS — BP 152/88 | HR 88 | Temp 98.1°F | Resp 18 | Wt 172.0 lb

## 2017-05-19 DIAGNOSIS — R918 Other nonspecific abnormal finding of lung field: Secondary | ICD-10-CM

## 2017-05-19 DIAGNOSIS — I200 Unstable angina: Secondary | ICD-10-CM | POA: Diagnosis not present

## 2017-05-19 DIAGNOSIS — I209 Angina pectoris, unspecified: Secondary | ICD-10-CM

## 2017-05-19 DIAGNOSIS — F418 Other specified anxiety disorders: Secondary | ICD-10-CM

## 2017-05-19 DIAGNOSIS — D219 Benign neoplasm of connective and other soft tissue, unspecified: Secondary | ICD-10-CM

## 2017-05-19 NOTE — Progress Notes (Signed)
Candace [name]  MRN: [id number] DOB: July 21, 1950  Subjective:  HPI   The patient is a 67 year old female who presents for follow up after a hospitalization at [facility] for dizziness.  Patient was admitted for near syncope, TIA and chest pain.  They ruled out stroke.  CT and MRI of the head was negative. Carotid doppler required further testing and MRA was done of the head and neck.  This showed 35% stenosis.  Due to reaction to previous stress test  A cardiac cath was performed with successful balloon angioplasty, no stent was placed.  CT of the abdomen/pelvis revealed enlarged uterus with requires follow up for probable fibroids.  There was also incidental findings of a 2 mm lung nodule in the left lower lobe which requires follow up.   A1C was 7.4.  Thyroid and Cholesterol levels were normal.   Patient was treated for UTI. Patient had bradycardia and her Metoprolol was decreased to 25 mg BID and the patient was started on Brillinta.  Patient Active Problem List   Diagnosis Date Noted  . B12 deficiency 12/01/2016  . Vitamin D deficiency 11/04/2016  . Depression with anxiety 09/30/2016  . PAF (paroxysmal atrial fibrillation) ([place]) 09/30/2016  . Chronic fatigue 09/30/2016  . Resting tremor 09/30/2016  . Nausea & vomiting 09/30/2016  . Mild obstructive sleep apnea 09/30/2016  . Headache 08/18/2016  . Abnormal stress test 08/05/2016  . Unstable angina ([place]) 08/03/2016  . Diabetes ([place]) 08/03/2016  . Snoring 02/13/2016  . Asthma 11/11/2015  . S/P CABG x 4 11/10/2015  . Coronary artery disease 11/07/2015  . Carotid artery narrowing 02/08/2014  . Chest pain 02/08/2014  . Mixed hyperlipidemia 02/08/2014  . Atypical chest pain 02/07/2014  . Essential (primary) hypertension 02/07/2014  . Awareness of heartbeats 02/07/2014  . Abdominal pain 10/05/2013  . D (diarrhea) 10/05/2013  . Acid reflux 10/05/2013    Past Medical History:  Diagnosis Date  . Allergy   . Anemia     . Anxiety   . Arrhythmia   . Arthritis   . Coronary artery disease   . Depression   . Diabetes mellitus without complication ([place])   . Dyspnea    doe  . Dysrhythmia   . GERD (gastroesophageal reflux disease)   . Headache   . Hyperlipidemia   . Hypertension   . Myocardial infarction ([place])    2016  . Myocardial infarction with cardiac rehabilitation [facility])    MI 2016/ CABG 8/17    FINISHED CARDIAC REHAB 3 WEEKS AGO  . Panic attack   . Reflux   . Stroke ([place])   . Voice tremor     Social History   Socioeconomic History  . Marital status: Divorced    Spouse name: Not on file  . Number of children: Not on file  . Years of education: Not on file  . Highest education level: Not on file  Social Needs  . Financial resource strain: Not on file  . Food insecurity - worry: Not on file  . Food insecurity - inability: Not on file  . Transportation needs - medical: Not on file  . Transportation needs - non-medical: Not on file  Occupational History  . Not on file  Tobacco Use  . Smoking status: Former Smoker    Packs/day: 0.50    Types: Cigarettes    Last attempt to quit: 10/07/2001    Years since quitting: 15.6  . Smokeless tobacco: Never Used  Substance  and Sexual Activity  . Alcohol use: No    Alcohol/week: 0.0 oz  . Drug use: No  . Sexual activity: Not Currently  Other Topics Concern  . Not on file  Social History Narrative  . Not on file    Outpatient Encounter Medications as of 05/19/2017  Medication Sig Note  . aspirin 81 MG chewable tablet Chew 1 tablet (81 mg total) by mouth daily.   . Coenzyme Q10 (CO Q 10) 100 MG CAPS Take 100 mg by mouth daily.   . isosorbide mononitrate (IMDUR) 30 MG 24 hr tablet Take 2 tablets (60 mg total) by mouth daily.   . metFORMIN (GLUCOPHAGE) 1000 MG tablet Take 1 tablet (1,000 mg total) by mouth 2 (two) times daily with a meal.   . metoprolol tartrate (LOPRESSOR) 25 MG tablet Take 25 mg by mouth 2 (two) times daily.   .  nitroGLYCERIN (NITROSTAT) 0.4 MG SL tablet Place 1 tablet (0.4 mg total) under the tongue every 5 (five) minutes as needed for chest pain.   Marland Kitchen ondansetron (ZOFRAN-ODT) 8 MG disintegrating tablet DISSOLVE 1 TABLET ON THE TONGUE EVERY 8 HOURS AS NEEDED FOR NAUSEA OR VOMITING   . rosuvastatin (CRESTOR) 10 MG tablet Take 1 tablet (10 mg total) by mouth daily.   . diazepam (VALIUM) 2 MG tablet Take 1 tablet by mouth 2 (two) times daily as needed.   . hydrochlorothiazide (HYDRODIURIL) 25 MG tablet Take 1 tablet (25 mg total) daily by mouth. (Patient not taking: Reported on 05/19/2017)   . loperamide (IMODIUM) 2 MG capsule Take by mouth as needed for diarrhea or loose stools.   . metoprolol tartrate (LOPRESSOR) 50 MG tablet TAKE 1 TABLET BY MOUTH TWICE A DAY FOR HIGH BLOOD PRESSURE (Patient not taking: Reported on 05/19/2017)   . tiZANidine (ZANAFLEX) 2 MG tablet Take 2 mg by mouth every 6 (six) hours as needed for muscle spasms (patient is to titrate up to 6 at bedtime). 05/19/2017: Patient has not started back on this yet   No facility-administered encounter medications on file as of 05/19/2017.     Allergies  Allergen Reactions  . Lisinopril     cough  . Penicillins Swelling, Rash and Other (See Comments)    Has patient had a PCN reaction causing immediate rash, facial/tongue/throat swelling, SOB or lightheadedness with hypotension: Yes Has patient had a PCN reaction causing severe rash involving mucus membranes or skin necrosis: Yes Has patient had a PCN reaction that required hospitalization No Has patient had a PCN reaction occurring within the last 10 years: Yes If all of the above answers are "NO", then may proceed with Cephalosporin use.     Review of Systems  Constitutional: Positive for malaise/fatigue. Negative for fever.  Eyes: Negative.   Respiratory: Positive for cough (minimal) and shortness of breath. Negative for sputum production and wheezing.   Cardiovascular: Positive for chest  pain, palpitations and claudication. Negative for orthopnea and leg swelling.  Gastrointestinal: Negative.   Skin: Negative.   Neurological: Positive for weakness.  Endo/Heme/Allergies: Negative.   Psychiatric/Behavioral: Negative.     Objective:  BP (!) 152/88 (BP Location: Right Arm, Patient Position: Sitting, Cuff Size: Normal)   Pulse 88   Temp 98.1 F (36.7 C) (Oral)   Resp 18   Wt 172 lb (78 kg)   SpO2 99%   BMI 30.47 kg/m   Physical Exam  Constitutional: She is well-developed, well-nourished, and in no distress.  HENT:  Head: Normocephalic and atraumatic.  Eyes: Conjunctivae are normal.  Neck: No thyromegaly present.  Cardiovascular: Normal rate, regular rhythm and normal heart sounds.  Pulmonary/Chest: Effort normal.    Assessment and Plan :   1. Unstable angina ([place])  Follow up with cardiology  2. Fibroids  - Ambulatory referral to Obstetrics / Gynecology  3. Lung nodules  Follow up with pulmonology 4.Headaches Pt advised to follow Dr Lonie Peak advice --stop Esgic. 5.CAD/s/p CABG Overall my impression is pt has been intolerant of anything short of getting back to what she considers normal. I think she is medically stable and mainly has the battle of dealing with her MDD/GAD. 6.MDD/GAD 7.Recent Dehydration Resolved.

## 2017-05-27 ENCOUNTER — Telehealth: Payer: Self-pay | Admitting: Family Medicine

## 2017-05-27 NOTE — Telephone Encounter (Signed)
Pt states that she is still having pain in lower abdomen.She is requesting something be sent to [facility] until she is seen by GYN near the end of March.Pt is aware you are out of office today

## 2017-05-30 ENCOUNTER — Other Ambulatory Visit: Payer: Self-pay

## 2017-05-30 MED ORDER — MELOXICAM 7.5 MG PO TABS: 7.5000 mg | ORAL_TABLET | Freq: Two times a day (BID) | ORAL | 0 refills | Status: DC | PRN

## 2017-06-01 DIAGNOSIS — I214 Non-ST elevation (NSTEMI) myocardial infarction: Secondary | ICD-10-CM | POA: Insufficient documentation

## 2017-06-02 ENCOUNTER — Other Ambulatory Visit: Payer: Self-pay | Admitting: *Deleted

## 2017-06-02 ENCOUNTER — Encounter: Payer: Medicare Other | Attending: Family Medicine | Admitting: *Deleted

## 2017-06-02 ENCOUNTER — Encounter: Payer: Self-pay | Admitting: *Deleted

## 2017-06-02 VITALS — Ht 62.75 in | Wt 173.5 lb

## 2017-06-02 DIAGNOSIS — Z8673 Personal history of transient ischemic attack (TIA), and cerebral infarction without residual deficits: Secondary | ICD-10-CM | POA: Diagnosis not present

## 2017-06-02 DIAGNOSIS — I25118 Atherosclerotic heart disease of native coronary artery with other forms of angina pectoris: Secondary | ICD-10-CM | POA: Insufficient documentation

## 2017-06-02 DIAGNOSIS — Z79899 Other long term (current) drug therapy: Secondary | ICD-10-CM | POA: Insufficient documentation

## 2017-06-02 DIAGNOSIS — Z9861 Coronary angioplasty status: Secondary | ICD-10-CM | POA: Insufficient documentation

## 2017-06-02 DIAGNOSIS — E785 Hyperlipidemia, unspecified: Secondary | ICD-10-CM | POA: Diagnosis not present

## 2017-06-02 DIAGNOSIS — Z87891 Personal history of nicotine dependence: Secondary | ICD-10-CM | POA: Diagnosis not present

## 2017-06-02 DIAGNOSIS — Z7982 Long term (current) use of aspirin: Secondary | ICD-10-CM | POA: Insufficient documentation

## 2017-06-02 DIAGNOSIS — I10 Essential (primary) hypertension: Secondary | ICD-10-CM | POA: Diagnosis not present

## 2017-06-02 DIAGNOSIS — Z791 Long term (current) use of non-steroidal anti-inflammatories (NSAID): Secondary | ICD-10-CM | POA: Insufficient documentation

## 2017-06-02 DIAGNOSIS — K219 Gastro-esophageal reflux disease without esophagitis: Secondary | ICD-10-CM | POA: Diagnosis not present

## 2017-06-02 DIAGNOSIS — E119 Type 2 diabetes mellitus without complications: Secondary | ICD-10-CM | POA: Insufficient documentation

## 2017-06-02 DIAGNOSIS — I252 Old myocardial infarction: Secondary | ICD-10-CM | POA: Insufficient documentation

## 2017-06-02 DIAGNOSIS — F329 Major depressive disorder, single episode, unspecified: Secondary | ICD-10-CM | POA: Diagnosis not present

## 2017-06-02 DIAGNOSIS — F419 Anxiety disorder, unspecified: Secondary | ICD-10-CM | POA: Insufficient documentation

## 2017-06-02 DIAGNOSIS — I208 Other forms of angina pectoris: Secondary | ICD-10-CM

## 2017-06-02 DIAGNOSIS — Z7984 Long term (current) use of oral hypoglycemic drugs: Secondary | ICD-10-CM | POA: Insufficient documentation

## 2017-06-02 NOTE — Progress Notes (Signed)
Daily Session Note  Patient Details  Name: Candace [name] MRN: [id number] Date of Birth: July 22, 1950 Referring Provider:     Cardiac Rehab from 06/02/2017 in [facility]  Referring Provider  Rosanna Randy      Encounter Date: 06/02/2017  Check In: Session Check In - 06/02/17 1209      Check-In   Location  [facility]    Staff Present  Renita Papa, RN Vickki Hearing, BA, ACSM CEP, Exercise Physiologist;Jamis Kryder, RN, BSN, CCRP    Supervising physician immediately available to respond to emergencies  See telemetry face sheet for immediately available ER MD    Medication changes reported      No    Fall or balance concerns reported     No    Warm-up and Cool-down  Performed as group-led instruction    Resistance Training Performed  Yes    VAD Patient?  No      Pain Assessment   Currently in Pain?  No/denies        Exercise Prescription Changes - 06/02/17 1400      Response to Exercise   Blood Pressure (Admit)  120/80    Blood Pressure (Exercise)  192/104    Blood Pressure (Exit)  148/74    Heart Rate (Admit)  85 bpm    Heart Rate (Exercise)  128 bpm    Heart Rate (Exit)  96 bpm    Oxygen Saturation (Admit)  96 %    Oxygen Saturation (Exit)  99 %    Rating of Perceived Exertion (Exercise)  15       Social History   Tobacco Use  Smoking Status Former Smoker  . Packs/day: 0.50  . Types: Cigarettes  . Last attempt to quit: 10/07/2001  . Years since quitting: 15.6  Smokeless Tobacco Never Used    Goals Met:  Personal goals reviewed  Goals Unmet:  Not Applicable  Comments: Med review completed today   Dr. Emily Filbert is Medical Director for [place] and [facility].

## 2017-06-02 NOTE — Progress Notes (Signed)
Cardiac Individual Treatment Plan  Patient Details  Name: Candace [name] MRN: [id number] Date of Birth: 18-Feb-1951 Referring Provider:     Cardiac Rehab from 06/02/2017 in [facility] Cardiac and Pulmonary Rehab  Referring Provider  Rosanna Randy      Initial Encounter Date:    Cardiac Rehab from 06/02/2017 in [facility] Cardiac and Pulmonary Rehab  Date  06/02/17  Referring Provider  Rosanna Randy      Visit Diagnosis: Chronic stable angina ([place])  S/P PTCA (percutaneous transluminal coronary angioplasty)  Patient's Home Medications on Admission:  Current Outpatient Medications:  .  aspirin 81 MG chewable tablet, Chew 1 tablet (81 mg total) by mouth daily., Disp: 90 tablet, Rfl: 3 .  Coenzyme Q10 (CO Q 10) 100 MG CAPS, Take 100 mg by mouth daily., Disp: 30 capsule, Rfl: 12 .  isosorbide mononitrate (IMDUR) 30 MG 24 hr tablet, Take 2 tablets (60 mg total) by mouth daily., Disp: 60 tablet, Rfl: 12 .  loperamide (IMODIUM) 2 MG capsule, Take by mouth as needed for diarrhea or loose stools., Disp: , Rfl:  .  meloxicam (MOBIC) 7.5 MG tablet, Take 1 tablet (7.5 mg total) by mouth 2 (two) times daily as needed for pain., Disp: 60 tablet, Rfl: 0 .  metFORMIN (GLUCOPHAGE) 1000 MG tablet, Take 1 tablet (1,000 mg total) by mouth 2 (two) times daily with a meal., Disp: 180 tablet, Rfl: 3 .  metoprolol tartrate (LOPRESSOR) 25 MG tablet, Take 25 mg by mouth 2 (two) times daily., Disp: , Rfl:  .  metoprolol tartrate (LOPRESSOR) 50 MG tablet, TAKE 1 TABLET BY MOUTH TWICE A DAY FOR HIGH BLOOD PRESSURE, Disp: 60 tablet, Rfl: 12 .  nitroGLYCERIN (NITROSTAT) 0.4 MG SL tablet, Place 1 tablet (0.4 mg total) under the tongue every 5 (five) minutes as needed for chest pain., Disp: 30 tablet, Rfl: 0 .  ondansetron (ZOFRAN-ODT) 8 MG disintegrating tablet, DISSOLVE 1 TABLET ON THE TONGUE EVERY 8 HOURS AS NEEDED FOR NAUSEA OR VOMITING, Disp: 30 tablet, Rfl: 3 .  rosuvastatin (CRESTOR) 10 MG tablet, Take 1 tablet (10 mg total) by mouth  daily., Disp: 90 tablet, Rfl: 3 .  tiZANidine (ZANAFLEX) 2 MG tablet, Take 2 mg by mouth every 6 (six) hours as needed for muscle spasms (patient is to titrate up to 6 at bedtime)., Disp: , Rfl:  .  diazepam (VALIUM) 2 MG tablet, Take 1 tablet by mouth 2 (two) times daily as needed., Disp: , Rfl:  .  hydrochlorothiazide (HYDRODIURIL) 25 MG tablet, Take 1 tablet (25 mg total) daily by mouth. (Patient not taking: Reported on 05/19/2017), Disp: 90 tablet, Rfl: 3  Past Medical History: Past Medical History:  Diagnosis Date  . Allergy   . Anemia   . Anxiety   . Arrhythmia   . Arthritis   . Coronary artery disease   . Depression   . Diabetes mellitus without complication ([place])   . Dyspnea    doe  . Dysrhythmia   . GERD (gastroesophageal reflux disease)   . Headache   . Hyperlipidemia   . Hypertension   . Myocardial infarction ([place])    2016  . Myocardial infarction with cardiac rehabilitation [facility])    MI 2016/ CABG 8/17    FINISHED CARDIAC REHAB 3 WEEKS AGO  . Panic attack   . Reflux   . Stroke ([place])   . Voice tremor     Tobacco Use: Social History   Tobacco Use  Smoking Status Former Smoker  . Packs/day: 0.50  .  Types: Cigarettes  . Last attempt to quit: 10/07/2001  . Years since quitting: 15.6  Smokeless Tobacco Never Used    Labs: Recent Chemical engineer    Labs for ITP Cardiac and Pulmonary Rehab Latest Ref Rng & Units 11/11/2015 08/18/2016 10/01/2016 12/23/2016 03/24/2017   Cholestrol 100 - 199 mg/dL - - 259(H) - -   LDLCALC 0 - 99 mg/dL - - 173(H) - -   HDL >39 mg/dL - - 66 - -   Trlycerides 0 - 149 mg/dL - - 100 - -   Hemoglobin A1c <5.7 % of total Hgb - 8.1 6.9(H) 7.3(H) 7.8(H)   PHART 7.350 - 7.450 - - - - -   PCO2ART 35.0 - 45.0 mmHg - - - - -   HCO3 20.0 - 24.0 mEq/L - - - - -   TCO2 0 - 100 mmol/L 26 - - - -   O2SAT % - - - - -       Exercise Target Goals: Date: 06/02/17  Exercise Program Goal: Individual exercise prescription set using results  from initial 6 min walk test and THRR while considering  patient's activity barriers and safety.   Exercise Prescription Goal: Initial exercise prescription builds to 30-45 minutes a day of aerobic activity, 2-3 days per week.  Home exercise guidelines will be given to patient during program as part of exercise prescription that the participant will acknowledge.  Activity Barriers & Risk Stratification: Activity Barriers & Cardiac Risk Stratification - 06/02/17 1340      Activity Barriers & Cardiac Risk Stratification   Activity Barriers  Back Problems;Deconditioning;Shortness of Breath;History of Falls;Chest Pain/Angina    Cardiac Risk Stratification  High       6 Minute Walk: 6 Minute Walk    Row Name 06/02/17 1427         6 Minute Walk   Distance  942 feet     Walk Time  6 minutes     # of Rest Breaks  0     MPH  1.78     METS  3.14     RPE  15     Perceived Dyspnea   2     VO2 Peak  10.99     Symptoms  Yes (comment)     Comments  legs fatigued     Resting HR  84 bpm     Resting BP  120/80     Resting Oxygen Saturation   96 %     Exercise Oxygen Saturation  during 6 min walk  99 %     Max Ex. HR  128 bpm     Max Ex. BP  192/104     2 Minute Post BP  148/74        Oxygen Initial Assessment:   Oxygen Re-Evaluation:   Oxygen Discharge (Final Oxygen Re-Evaluation):   Initial Exercise Prescription: Initial Exercise Prescription - 06/02/17 1400      Date of Initial Exercise RX and Referring Provider   Date  06/02/17    Referring Provider  Rosanna Randy      Recumbant Bike   Level  2    RPM  60    Watts  28    Minutes  15    METs  3      NuStep   Level  3    SPM  80    Minutes  15    METs  3      Track  Laps  45    Minutes  15    METs  3      Prescription Details   Frequency (times per week)  3    Duration  Progress to 30 minutes of continuous aerobic without signs/symptoms of physical distress      Intensity   THRR 40-80% of Max Heartrate   112-140    Ratings of Perceived Exertion  11-13    Perceived Dyspnea  0-4      Resistance Training   Training Prescription  Yes    Weight  3 lb    Reps  10-15       Perform Capillary Blood Glucose checks as needed.  Exercise Prescription Changes: Exercise Prescription Changes    Row Name 06/02/17 1400             Response to Exercise   Blood Pressure (Admit)  120/80       Blood Pressure (Exercise)  192/104       Blood Pressure (Exit)  148/74       Heart Rate (Admit)  85 bpm       Heart Rate (Exercise)  128 bpm       Heart Rate (Exit)  96 bpm       Oxygen Saturation (Admit)  96 %       Oxygen Saturation (Exit)  99 %       Rating of Perceived Exertion (Exercise)  15          Exercise Comments:   Exercise Goals and Review: Exercise Goals    Row Name 06/02/17 1426             Exercise Goals   Increase Physical Activity  Yes       Intervention  Provide advice, education, support and counseling about physical activity/exercise needs.;Develop an individualized exercise prescription for aerobic and resistive training based on initial evaluation findings, risk stratification, comorbidities and participant's personal goals.       Expected Outcomes  Short Term: Attend rehab on a regular basis to increase amount of physical activity.;Long Term: Add in home exercise to make exercise part of routine and to increase amount of physical activity.;Long Term: Exercising regularly at least 3-5 days a week.       Increase Strength and Stamina  Yes       Intervention  Provide advice, education, support and counseling about physical activity/exercise needs.;Develop an individualized exercise prescription for aerobic and resistive training based on initial evaluation findings, risk stratification, comorbidities and participant's personal goals.       Expected Outcomes  Short Term: Increase workloads from initial exercise prescription for resistance, speed, and METs.;Short Term: Perform  resistance training exercises routinely during rehab and add in resistance training at home;Long Term: Improve cardiorespiratory fitness, muscular endurance and strength as measured by increased METs and functional capacity (6MWT)       Able to understand and use rate of perceived exertion (RPE) scale  Yes       Intervention  Provide education and explanation on how to use RPE scale       Expected Outcomes  Short Term: Able to use RPE daily in rehab to express subjective intensity level;Long Term:  Able to use RPE to guide intensity level when exercising independently       Able to understand and use Dyspnea scale  Yes       Intervention  Provide education and explanation on how to use Dyspnea scale  Expected Outcomes  Short Term: Able to use Dyspnea scale daily in rehab to express subjective sense of shortness of breath during exertion;Long Term: Able to use Dyspnea scale to guide intensity level when exercising independently       Knowledge and understanding of Target Heart Rate Range (THRR)  Yes       Intervention  Provide education and explanation of THRR including how the numbers were predicted and where they are located for reference       Expected Outcomes  Short Term: Able to state/look up THRR;Long Term: Able to use THRR to govern intensity when exercising independently;Short Term: Able to use daily as guideline for intensity in rehab       Able to check pulse independently  Yes       Intervention  Provide education and demonstration on how to check pulse in carotid and radial arteries.;Review the importance of being able to check your own pulse for safety during independent exercise       Expected Outcomes  Short Term: Able to explain why pulse checking is important during independent exercise;Long Term: Able to check pulse independently and accurately       Understanding of Exercise Prescription  Yes       Intervention  Provide education, explanation, and written materials on patient's  individual exercise prescription       Expected Outcomes  Short Term: Able to explain program exercise prescription;Long Term: Able to explain home exercise prescription to exercise independently          Exercise Goals Re-Evaluation :   Discharge Exercise Prescription (Final Exercise Prescription Changes): Exercise Prescription Changes - 06/02/17 1400      Response to Exercise   Blood Pressure (Admit)  120/80    Blood Pressure (Exercise)  192/104    Blood Pressure (Exit)  148/74    Heart Rate (Admit)  85 bpm    Heart Rate (Exercise)  128 bpm    Heart Rate (Exit)  96 bpm    Oxygen Saturation (Admit)  96 %    Oxygen Saturation (Exit)  99 %    Rating of Perceived Exertion (Exercise)  15       Nutrition:  Target Goals: Understanding of nutrition guidelines, daily intake of sodium <1540m, cholesterol <2037m calories 30% from fat and 7% or less from saturated fats, daily to have 5 or more servings of fruits and vegetables.  Biometrics: Pre Biometrics - 06/02/17 1425      Pre Biometrics   Height  5' 2.75" (1.594 m)    Weight  173 lb 8 oz (78.7 kg)    Waist Circumference  38 inches    Hip Circumference  42.5 inches    Waist to Hip Ratio  0.89 %    BMI (Calculated)  30.97    Single Leg Stand  4.26 seconds        Nutrition Therapy Plan and Nutrition Goals: Nutrition Therapy & Goals - 06/02/17 1341      Intervention Plan   Intervention  Prescribe, educate and counsel regarding individualized specific dietary modifications aiming towards targeted core components such as weight, hypertension, lipid management, diabetes, heart failure and other comorbidities.    Expected Outcomes  Short Term Goal: Understand basic principles of dietary content, such as calories, fat, sodium, cholesterol and nutrients.;Short Term Goal: A plan has been developed with personal nutrition goals set during dietitian appointment.;Long Term Goal: Adherence to prescribed nutrition plan.       Nutrition  Assessments:  Nutrition Assessments - 06/02/17 1345      MEDFICTS Scores   Pre Score  10       Nutrition Goals Re-Evaluation:   Nutrition Goals Discharge (Final Nutrition Goals Re-Evaluation):   Psychosocial: Target Goals: Acknowledge presence or absence of significant depression and/or stress, maximize coping skills, provide positive support system. Participant is able to verbalize types and ability to use techniques and skills needed for reducing stress and depression.   Initial Review & Psychosocial Screening: Initial Psych Review & Screening - 06/02/17 1342      Initial Review   Current issues with  Current Stress Concerns    Source of Stress Concerns  Unable to participate in former interests or hobbies;Unable to perform yard/household activities      [place]?  Yes      Barriers   Psychosocial barriers to participate in program  There are no identifiable barriers or psychosocial needs.;The patient should benefit from training in stress management and relaxation.      Screening Interventions   Interventions  Encouraged to exercise;Provide feedback about the scores to participant;To provide support and resources with identified psychosocial needs    Expected Outcomes  Short Term goal: Utilizing psychosocial counselor, staff and physician to assist with identification of specific Stressors or current issues interfering with healing process. Setting desired goal for each stressor or current issue identified.;Long Term Goal: Stressors or current issues are controlled or eliminated.;Short Term goal: Identification and review with participant of any Quality of Life or Depression concerns found by scoring the questionnaire.;Long Term goal: The participant improves quality of Life and PHQ9 Scores as seen by post scores and/or verbalization of changes       Quality of Life Scores:  Quality of Life - 06/02/17 1204      Quality of Life Scores    Health/Function Pre  9.47 %    Socioeconomic Pre  18058 %    Psych/Spiritual Pre  18.86 %    Family Pre  21.9 %    GLOBAL Pre  15 %      Scores of 19 and below usually indicate a poorer quality of life in these areas.  A difference of  2-3 points is a clinically meaningful difference.  A difference of 2-3 points in the total score of the Quality of Life Index has been associated with significant improvement in overall quality of life, self-image, physical symptoms, and general health in studies assessing change in quality of life.  PHQ-9: Recent Review Flowsheet Data    Depression screen [facility] 2/9 06/02/2017 04/25/2017 11/04/2016 07/28/2016 03/16/2016   Decreased Interest 2 0 0 3 1   Down, Depressed, Hopeless 1 0 0 0 1   PHQ - 2 Score 3 0 0 3 2   Altered sleeping 3 3 0 0 0   Tired, decreased energy 2 3 0 3 1   Change in appetite 2 2 0 3 1   Feeling bad or failure about yourself  1 0 0 0 0   Trouble concentrating 1 0 0 0 0   Moving slowly or fidgety/restless 2 0 0 0 0   Suicidal thoughts 0 2  - 0 0   PHQ-9 Score 14 10 0 9 4   Difficult doing work/chores Not difficult at all Somewhat difficult - - Not difficult at all     Interpretation of Total Score  Total Score Depression Severity:  1-4 = Minimal depression, 5-9 = Mild depression, 10-14 =  Moderate depression, 15-19 = Moderately severe depression, 20-27 = Severe depression   Psychosocial Evaluation and Intervention:   Psychosocial Re-Evaluation:   Psychosocial Discharge (Final Psychosocial Re-Evaluation):   Vocational Rehabilitation: Provide vocational rehab assistance to qualifying candidates.   Vocational Rehab Evaluation & Intervention: Vocational Rehab - 06/02/17 1206      Initial Vocational Rehab Evaluation & Intervention   Assessment shows need for Vocational Rehabilitation  No       Education: Education Goals: Education classes will be provided on a variety of topics geared toward better understanding of heart health  and risk factor modification. Participant will state understanding/return demonstration of topics presented as noted by education test scores.  Learning Barriers/Preferences: Learning Barriers/Preferences - 06/02/17 1343      Learning Barriers/Preferences   Learning Barriers  Exercise Concerns Exertion causes SOB.   Chronic headache from back problems    Learning Preferences  None       Education Topics:  AED/CPR: - Group verbal and written instruction with the use of models to demonstrate the basic use of the AED with the basic ABC's of resuscitation.   General Nutrition Guidelines/Fats and Fiber: -Group instruction provided by verbal, written material, models and posters to present the general guidelines for heart healthy nutrition. Gives an explanation and review of dietary fats and fiber.   Cardiac Rehab from 04/08/2016 in [facility] Cardiac and Pulmonary Rehab  Date  04/06/16  Educator  PI  Instruction Review Code (retired)  2- meets goals/outcomes      Controlling Sodium/Reading Food Labels: -Group verbal and written material supporting the discussion of sodium use in heart healthy nutrition. Review and explanation with models, verbal and written materials for utilization of the food label.   Cardiac Rehab from 04/08/2016 in [facility] Cardiac and Pulmonary Rehab  Date  02/16/16  Educator  PI  Instruction Review Code (retired)  2- meets goals/outcomes      Exercise Physiology & General Exercise Guidelines: - Group verbal and written instruction with models to review the exercise physiology of the cardiovascular system and associated critical values. Provides general exercise guidelines with specific guidelines to those with heart or lung disease.    Cardiac Rehab from 04/08/2016 in [facility] Cardiac and Pulmonary Rehab  Date  02/05/16  Educator  [facility]  Instruction Review Code (retired)  2- meets goals/outcomes      Aerobic Exercise & Resistance Training: - Gives group verbal and written  instruction on the various components of exercise. Focuses on aerobic and resistive training programs and the benefits of this training and how to safely progress through these programs..   Cardiac Rehab from 04/08/2016 in [facility] Cardiac and Pulmonary Rehab  Date  02/24/16  Educator  [facility]  Instruction Review Code (retired)  2- Statistician, Balance, Mind/Body Relaxation: Provides group verbal/written instruction on the benefits of flexibility and balance training, including mind/body exercise modes such as yoga, pilates and tai chi.  Demonstration and skill practice provided.   Cardiac Rehab from 04/08/2016 in [facility] Cardiac and Pulmonary Rehab  Date  02/26/16  Educator  [facility]  Instruction Review Code (retired)  2- meets goals/outcomes      Stress and Anxiety: - Provides group verbal and written instruction about the health risks of elevated stress and causes of high stress.  Discuss the correlation between heart/lung disease and anxiety and treatment options. Review healthy ways to manage with stress and anxiety.   Depression: - Provides group verbal and written instruction on  the correlation between heart/lung disease and depressed mood, treatment options, and the stigmas associated with seeking treatment.   Cardiac Rehab from 04/08/2016 in [facility] Cardiac and Pulmonary Rehab  Date  04/08/16  Educator  TS  Instruction Review Code (retired)  2- meets Designer, fashion/clothing & Physiology of the Heart: - Group verbal and written instruction and models provide basic cardiac anatomy and physiology, with the coronary electrical and arterial systems. Review of Valvular disease and Heart Failure   Cardiac Rehab from 04/08/2016 in [facility] Cardiac and Pulmonary Rehab  Date  03/02/16  Educator  SB  Instruction Review Code (retired)  2- meets goals/outcomes      Cardiac Procedures: - Group verbal and written instruction to review commonly prescribed medications for heart  disease. Reviews the medication, class of the drug, and side effects. Includes the steps to properly store meds and maintain the prescription regimen. (beta blockers and nitrates)   Cardiac Rehab from 04/08/2016 in [facility] Cardiac and Pulmonary Rehab  Date  03/09/16  Educator  SB  Instruction Review Code (retired)  2- meets goals/outcomes      Cardiac Medications I: - Group verbal and written instruction to review commonly prescribed medications for heart disease. Reviews the medication, class of the drug, and side effects. Includes the steps to properly store meds and maintain the prescription regimen.   Cardiac Rehab from 04/08/2016 in [facility] Cardiac and Pulmonary Rehab  Date  03/18/16 Marisue Humble 2]  Educator  TS  Instruction Review Code (retired)  2- meets goals/outcomes      Cardiac Medications II: -Group verbal and written instruction to review commonly prescribed medications for heart disease. Reviews the medication, class of the drug, and side effects. (all other drug classes)    Go Sex-Intimacy & Heart Disease, Get SMART - Goal Setting: - Group verbal and written instruction through game format to discuss heart disease and the return to sexual intimacy. Provides group verbal and written material to discuss and apply goal setting through the application of the S.M.A.R.T. Method.   Cardiac Rehab from 04/08/2016 in [facility] Cardiac and Pulmonary Rehab  Date  03/09/16  Educator  SB  Instruction Review Code (retired)  2- meets goals/outcomes      Other Matters of the Heart: - Provides group verbal, written materials and models to describe Stable Angina and Peripheral Artery. Includes description of the disease process and treatment options available to the cardiac patient.   Exercise & Equipment Safety: - Individual verbal instruction and demonstration of equipment use and safety with use of the equipment.   Cardiac Rehab from 06/02/2017 in [facility] Cardiac and Pulmonary Rehab  Date  06/02/17    Educator  SB  Instruction Review Code  1- Verbalizes Understanding      Infection Prevention: - Provides verbal and written material to individual with discussion of infection control including proper hand washing and proper equipment cleaning during exercise session.   Cardiac Rehab from 06/02/2017 in [facility] Cardiac and Pulmonary Rehab  Date  06/02/17  Educator  SB  Instruction Review Code  1- Verbalizes Understanding      Falls Prevention: - Provides verbal and written material to individual with discussion of falls prevention and safety.   Cardiac Rehab from 06/02/2017 in [facility] Cardiac and Pulmonary Rehab  Date  06/02/17  Educator  SB  Instruction Review Code  1- Verbalizes Understanding      Diabetes: - Individual verbal and written instruction to review signs/symptoms of diabetes, desired ranges  of glucose level fasting, after meals and with exercise. Acknowledge that pre and post exercise glucose checks will be done for 3 sessions at entry of program.   Cardiac Rehab from 04/08/2016 in [facility] Cardiac and Pulmonary Rehab  Date  01/12/16  Educator  C. ENterkinRN  Instruction Review Code (retired)  2- meets goals/outcomes      Know Your Numbers and Risk Factors: -Group verbal and written instruction about important numbers in your health.  Discussion of what are risk factors and how they play a role in the disease process.  Review of Cholesterol, Blood Pressure, Diabetes, and BMI and the role they play in your overall health.   Sleep Hygiene: -Provides group verbal and written instruction about how sleep can affect your health.  Define sleep hygiene, discuss sleep cycles and impact of sleep habits. Review good sleep hygiene tips.    Other: -Provides group and verbal instruction on various topics (see comments)   Knowledge Questionnaire Score: Knowledge Questionnaire Score - 06/02/17 1344      Knowledge Questionnaire Score   Pre Score  -- Reviewed correct responses with Lindie  today. She vebalized understanding of the responses and did not have any questions.        Core Components/Risk Factors/Patient Goals at Admission: Personal Goals and Risk Factors at Admission - 06/02/17 1341      Core Components/Risk Factors/Patient Goals on Admission    Weight Management  Yes;Obesity    Intervention  Weight Management: Develop a combined nutrition and exercise program designed to reach desired caloric intake, while maintaining appropriate intake of nutrient and fiber, sodium and fats, and appropriate energy expenditure required for the weight goal.    Admit Weight  173 lb (78.5 kg)    Goal Weight: Short Term  171 lb (77.6 kg)    Goal Weight: Long Term  159 lb (72.1 kg)    Expected Outcomes  Short Term: Continue to assess and modify interventions until short term weight is achieved;Long Term: Adherence to nutrition and physical activity/exercise program aimed toward attainment of established weight goal;Weight Loss: Understanding of general recommendations for a balanced deficit meal plan, which promotes 1-2 lb weight loss per week and includes a negative energy balance of [phone] kcal/d    Diabetes  Yes    Intervention  Provide education about signs/symptoms and action to take for hypo/hyperglycemia.;Provide education about proper nutrition, including hydration, and aerobic/resistive exercise prescription along with prescribed medications to achieve blood glucose in normal ranges: Fasting glucose 65-99 mg/dL    Expected Outcomes  Short Term: Participant verbalizes understanding of the signs/symptoms and immediate care of hyper/hypoglycemia, proper foot care and importance of medication, aerobic/resistive exercise and nutrition plan for blood glucose control.;Long Term: Attainment of HbA1C < 7%.    Hypertension  Yes    Intervention  Provide education on lifestyle modifcations including regular physical activity/exercise, weight management, moderate sodium restriction and  increased consumption of fresh fruit, vegetables, and low fat dairy, alcohol moderation, and smoking cessation.;Monitor prescription use compliance.    Expected Outcomes  Short Term: Continued assessment and intervention until BP is < 140/73m HG in hypertensive participants. < 130/823mHG in hypertensive participants with diabetes, heart failure or chronic kidney disease.;Long Term: Maintenance of blood pressure at goal levels.    Lipids  Yes    Intervention  Provide education and support for participant on nutrition & aerobic/resistive exercise along with prescribed medications to achieve LDL <709mHDL >58m68m  Expected Outcomes  Short Term: Participant  states understanding of desired cholesterol values and is compliant with medications prescribed. Participant is following exercise prescription and nutrition guidelines.;Long Term: Cholesterol controlled with medications as prescribed, with individualized exercise RX and with personalized nutrition plan. Value goals: LDL < 73m, HDL > 40 mg.       Core Components/Risk Factors/Patient Goals Review:    Core Components/Risk Factors/Patient Goals at Discharge (Final Review):    ITP Comments: ITP Comments    Row Name 06/02/17 1336           ITP Comments  New orientation after recent angioplasty and chronic stable angina diagnosis.  ITP completed and sent to Dr MSabra Heckfor review, changes as needed and signature. [facility],           Comments:

## 2017-06-02 NOTE — Patient Instructions (Signed)
Patient Instructions  Patient Details  Name: Candace [name] MRN: [id number] Date of Birth: 1950-07-26 Referring Provider:  [name].,*  Below are your personal goals for exercise, nutrition, and risk factors. Our goal is to help you stay on track towards obtaining and maintaining these goals. We will be discussing your progress on these goals with you throughout the program.  Initial Exercise Prescription: Initial Exercise Prescription - 06/02/17 1400      Date of Initial Exercise RX and Referring Provider   Date  06/02/17    Referring Provider  Rosanna Randy      Recumbant Bike   Level  2    RPM  60    Watts  28    Minutes  15    METs  3      NuStep   Level  3    SPM  80    Minutes  15    METs  3      Track   Laps  45    Minutes  15    METs  3      Prescription Details   Frequency (times per week)  3    Duration  Progress to 30 minutes of continuous aerobic without signs/symptoms of physical distress      Intensity   THRR 40-80% of Max Heartrate  112-140    Ratings of Perceived Exertion  11-13    Perceived Dyspnea  0-4      Resistance Training   Training Prescription  Yes    Weight  3 lb    Reps  10-15       Exercise Goals: Frequency: Be able to perform aerobic exercise two to three times per week in program working toward 2-5 days per week of home exercise.  Intensity: Work with a perceived exertion of 11 (fairly light) - 15 (hard) while following your exercise prescription.  We will make changes to your prescription with you as you progress through the program.   Duration: Be able to do 30 to 45 minutes of continuous aerobic exercise in addition to a 5 minute warm-up and a 5 minute cool-down routine.   Nutrition Goals: Your personal nutrition goals will be established when you do your nutrition analysis with the dietician.  The following are general nutrition guidelines to follow: Cholesterol < 200mg /day Sodium < 1500mg /day Fiber: Women over 50  yrs - 21 grams per day  Personal Goals: Personal Goals and Risk Factors at Admission - 06/02/17 1341      Core Components/Risk Factors/Patient Goals on Admission    Weight Management  Yes;Obesity    Intervention  Weight Management: Develop a combined nutrition and exercise program designed to reach desired caloric intake, while maintaining appropriate intake of nutrient and fiber, sodium and fats, and appropriate energy expenditure required for the weight goal.    Admit Weight  173 lb (78.5 kg)    Goal Weight: Short Term  171 lb (77.6 kg)    Goal Weight: Long Term  159 lb (72.1 kg)    Expected Outcomes  Short Term: Continue to assess and modify interventions until short term weight is achieved;Long Term: Adherence to nutrition and physical activity/exercise program aimed toward attainment of established weight goal;Weight Loss: Understanding of general recommendations for a balanced deficit meal plan, which promotes 1-2 lb weight loss per week and includes a negative energy balance of [phone] kcal/d    Diabetes  Yes    Intervention  Provide education about signs/symptoms  and action to take for hypo/hyperglycemia.;Provide education about proper nutrition, including hydration, and aerobic/resistive exercise prescription along with prescribed medications to achieve blood glucose in normal ranges: Fasting glucose 65-99 mg/dL    Expected Outcomes  Short Term: Participant verbalizes understanding of the signs/symptoms and immediate care of hyper/hypoglycemia, proper foot care and importance of medication, aerobic/resistive exercise and nutrition plan for blood glucose control.;Long Term: Attainment of HbA1C < 7%.    Hypertension  Yes    Intervention  Provide education on lifestyle modifcations including regular physical activity/exercise, weight management, moderate sodium restriction and increased consumption of fresh fruit, vegetables, and low fat dairy, alcohol moderation, and smoking  cessation.;Monitor prescription use compliance.    Expected Outcomes  Short Term: Continued assessment and intervention until BP is < 140/12mm HG in hypertensive participants. < 130/49mm HG in hypertensive participants with diabetes, heart failure or chronic kidney disease.;Long Term: Maintenance of blood pressure at goal levels.    Lipids  Yes    Intervention  Provide education and support for participant on nutrition & aerobic/resistive exercise along with prescribed medications to achieve LDL 70mg , HDL >40mg .    Expected Outcomes  Short Term: Participant states understanding of desired cholesterol values and is compliant with medications prescribed. Participant is following exercise prescription and nutrition guidelines.;Long Term: Cholesterol controlled with medications as prescribed, with individualized exercise RX and with personalized nutrition plan. Value goals: LDL < 70mg , HDL > 40 mg.       Tobacco Use Initial Evaluation: Social History   Tobacco Use  Smoking Status Former Smoker  . Packs/day: 0.50  . Types: Cigarettes  . Last attempt to quit: 10/07/2001  . Years since quitting: 15.6  Smokeless Tobacco Never Used    Exercise Goals and Review: Exercise Goals    Row Name 06/02/17 1426             Exercise Goals   Increase Physical Activity  Yes       Intervention  Provide advice, education, support and counseling about physical activity/exercise needs.;Develop an individualized exercise prescription for aerobic and resistive training based on initial evaluation findings, risk stratification, comorbidities and participant's personal goals.       Expected Outcomes  Short Term: Attend rehab on a regular basis to increase amount of physical activity.;Long Term: Add in home exercise to make exercise part of routine and to increase amount of physical activity.;Long Term: Exercising regularly at least 3-5 days a week.       Increase Strength and Stamina  Yes       Intervention   Provide advice, education, support and counseling about physical activity/exercise needs.;Develop an individualized exercise prescription for aerobic and resistive training based on initial evaluation findings, risk stratification, comorbidities and participant's personal goals.       Expected Outcomes  Short Term: Increase workloads from initial exercise prescription for resistance, speed, and METs.;Short Term: Perform resistance training exercises routinely during rehab and add in resistance training at home;Long Term: Improve cardiorespiratory fitness, muscular endurance and strength as measured by increased METs and functional capacity (6MWT)       Able to understand and use rate of perceived exertion (RPE) scale  Yes       Intervention  Provide education and explanation on how to use RPE scale       Expected Outcomes  Short Term: Able to use RPE daily in rehab to express subjective intensity level;Long Term:  Able to use RPE to guide intensity level when exercising independently  Able to understand and use Dyspnea scale  Yes       Intervention  Provide education and explanation on how to use Dyspnea scale       Expected Outcomes  Short Term: Able to use Dyspnea scale daily in rehab to express subjective sense of shortness of breath during exertion;Long Term: Able to use Dyspnea scale to guide intensity level when exercising independently       Knowledge and understanding of Target Heart Rate Range (THRR)  Yes       Intervention  Provide education and explanation of THRR including how the numbers were predicted and where they are located for reference       Expected Outcomes  Short Term: Able to state/look up THRR;Long Term: Able to use THRR to govern intensity when exercising independently;Short Term: Able to use daily as guideline for intensity in rehab       Able to check pulse independently  Yes       Intervention  Provide education and demonstration on how to check pulse in carotid and  radial arteries.;Review the importance of being able to check your own pulse for safety during independent exercise       Expected Outcomes  Short Term: Able to explain why pulse checking is important during independent exercise;Long Term: Able to check pulse independently and accurately       Understanding of Exercise Prescription  Yes       Intervention  Provide education, explanation, and written materials on patient's individual exercise prescription       Expected Outcomes  Short Term: Able to explain program exercise prescription;Long Term: Able to explain home exercise prescription to exercise independently          Copy of goals given to participant.

## 2017-06-07 DIAGNOSIS — Z951 Presence of aortocoronary bypass graft: Secondary | ICD-10-CM

## 2017-06-07 DIAGNOSIS — I25118 Atherosclerotic heart disease of native coronary artery with other forms of angina pectoris: Secondary | ICD-10-CM | POA: Diagnosis not present

## 2017-06-07 DIAGNOSIS — Z9861 Coronary angioplasty status: Secondary | ICD-10-CM

## 2017-06-07 DIAGNOSIS — I208 Other forms of angina pectoris: Secondary | ICD-10-CM

## 2017-06-07 LAB — GLUCOSE, CAPILLARY
Glucose-Capillary: 172 mg/dL — ABNORMAL HIGH (ref 65–99)
Glucose-Capillary: 147 mg/dL — ABNORMAL HIGH (ref 65–99)

## 2017-06-07 NOTE — Progress Notes (Signed)
Daily Session Note  Patient Details  Name: Candace [name] MRN: [id number] Date of Birth: [birth year] Referring Provider:     Cardiac Rehab from 06/02/2017 in [facility] Cardiac and Pulmonary Rehab  Referring Provider  Rosanna Randy      Encounter Date: 06/07/2017  Check In: Session Check In - 06/07/17 0824      Check-In   Location  [facility]-Cardiac & Pulmonary Rehab    Staff Present  Alberteen Sam, MA, RCEP, CCRP, Exercise Physiologist;Amanda Oletta Darter, BA, ACSM CEP, Exercise Physiologist;Susanne Bice, RN, BSN, CCRP    Supervising physician immediately available to respond to emergencies  See telemetry face sheet for immediately available ER MD    Medication changes reported      No    Fall or balance concerns reported     No    Warm-up and Cool-down  Performed on first and last piece of equipment    Resistance Training Performed  Yes    VAD Patient?  No      Pain Assessment   Currently in Pain?  No/denies    Multiple Pain Sites  No          Social History   Tobacco Use  Smoking Status Former Smoker  . Packs/day: 0.50  . Types: Cigarettes  . Last attempt to quit: 10/07/2001  . Years since quitting: 15.6  Smokeless Tobacco Never Used    Goals Met:  Exercise tolerated well Personal goals reviewed No report of cardiac concerns or symptoms Strength training completed today  Goals Unmet:  Not Applicable  Comments: First full day of exercise!  Patient was oriented to gym and equipment including functions, settings, policies, and procedures.  Patient's individual exercise prescription and treatment plan were reviewed.  All starting workloads were established based on the results of the 6 minute walk test done at initial orientation visit.  The plan for exercise progression was also introduced and progression will be customized based on patient's performance and goals.    Dr. Emily Filbert is Medical Director for [facility] and [facility].

## 2017-06-09 DIAGNOSIS — Z951 Presence of aortocoronary bypass graft: Secondary | ICD-10-CM

## 2017-06-09 DIAGNOSIS — Z9861 Coronary angioplasty status: Secondary | ICD-10-CM

## 2017-06-09 DIAGNOSIS — I208 Other forms of angina pectoris: Secondary | ICD-10-CM

## 2017-06-09 DIAGNOSIS — I25118 Atherosclerotic heart disease of native coronary artery with other forms of angina pectoris: Secondary | ICD-10-CM | POA: Diagnosis not present

## 2017-06-09 LAB — GLUCOSE, CAPILLARY: Glucose-Capillary: 136 mg/dL — ABNORMAL HIGH (ref 65–99)

## 2017-06-09 NOTE — Progress Notes (Signed)
Incomplete Session Note  Patient Details  Name: Candace [name] MRN: [id number] Date of Birth: 12-19-1950 Referring Provider:     Cardiac Rehab from 06/02/2017 in [facility] Cardiac and Pulmonary Rehab  Referring Provider  [name] did not complete her rehab session.

## 2017-06-13 ENCOUNTER — Telehealth: Payer: Self-pay

## 2017-06-13 NOTE — Telephone Encounter (Signed)
Candace [name] called to say she would not be at [facility] tomorrow morning - she doesn't feel up to it

## 2017-06-16 DIAGNOSIS — Z951 Presence of aortocoronary bypass graft: Secondary | ICD-10-CM

## 2017-06-16 DIAGNOSIS — I208 Other forms of angina pectoris: Secondary | ICD-10-CM

## 2017-06-16 DIAGNOSIS — Z9861 Coronary angioplasty status: Secondary | ICD-10-CM

## 2017-06-16 DIAGNOSIS — I25118 Atherosclerotic heart disease of native coronary artery with other forms of angina pectoris: Secondary | ICD-10-CM | POA: Diagnosis not present

## 2017-06-16 NOTE — Progress Notes (Signed)
Daily Session Note  Patient Details  Name: Candace [name] MRN: [id number] Date of Birth: 04/11/1950 Referring Provider:     Cardiac Rehab from 06/02/2017 in [facility] Cardiac and Pulmonary Rehab  Referring Provider  Rosanna Randy      Encounter Date: 06/16/2017  Check In: Session Check In - 06/16/17 0825      Check-In   Location  [facility]-Cardiac & Pulmonary Rehab    Staff Present  Alberteen Sam, MA, RCEP, CCRP, Exercise Physiologist;Amanda Oletta Darter, BA, ACSM CEP, Exercise Physiologist;Carroll Enterkin, RN, BSN    Supervising physician immediately available to respond to emergencies  See telemetry face sheet for immediately available ER MD    Medication changes reported      No    Fall or balance concerns reported     No    Warm-up and Cool-down  Performed on first and last piece of equipment    Resistance Training Performed  Yes    VAD Patient?  No      Pain Assessment   Currently in Pain?  No/denies    Multiple Pain Sites  No          Social History   Tobacco Use  Smoking Status Former Smoker  . Packs/day: 0.50  . Types: Cigarettes  . Last attempt to quit: 10/07/2001  . Years since quitting: 15.7  Smokeless Tobacco Never Used    Goals Met:  Independence with exercise equipment Exercise tolerated well No report of cardiac concerns or symptoms Strength training completed today  Goals Unmet:  Not Applicable  Comments: Pt able to follow exercise prescription today without complaint.  Will continue to monitor for progression. Reviewed home exercise with pt today.  Pt plans to walk and dance for exercise.  Reviewed THR, pulse, RPE, sign and symptoms, NTG use, and when to call 911 or MD.  Also discussed weather considerations and indoor options.  Pt voiced understanding.    Dr. Emily Filbert is Medical Director for [place] and [facility].

## 2017-06-22 ENCOUNTER — Other Ambulatory Visit: Payer: Self-pay | Admitting: Obstetrics and Gynecology

## 2017-06-22 ENCOUNTER — Encounter: Payer: Self-pay | Admitting: Obstetrics and Gynecology

## 2017-06-22 ENCOUNTER — Other Ambulatory Visit (INDEPENDENT_AMBULATORY_CARE_PROVIDER_SITE_OTHER): Payer: Medicare Other

## 2017-06-22 ENCOUNTER — Encounter: Payer: Self-pay | Admitting: *Deleted

## 2017-06-22 ENCOUNTER — Ambulatory Visit (INDEPENDENT_AMBULATORY_CARE_PROVIDER_SITE_OTHER): Payer: Medicare Other | Admitting: Obstetrics and Gynecology

## 2017-06-22 VITALS — BP 133/81 | HR 73 | Ht 62.0 in | Wt 172.6 lb

## 2017-06-22 DIAGNOSIS — R102 Pelvic and perineal pain: Secondary | ICD-10-CM | POA: Diagnosis not present

## 2017-06-22 DIAGNOSIS — I2089 Other forms of angina pectoris: Secondary | ICD-10-CM

## 2017-06-22 DIAGNOSIS — D259 Leiomyoma of uterus, unspecified: Secondary | ICD-10-CM | POA: Diagnosis not present

## 2017-06-22 DIAGNOSIS — I25118 Atherosclerotic heart disease of native coronary artery with other forms of angina pectoris: Secondary | ICD-10-CM | POA: Diagnosis not present

## 2017-06-22 DIAGNOSIS — Z9861 Coronary angioplasty status: Secondary | ICD-10-CM

## 2017-06-22 DIAGNOSIS — Z8673 Personal history of transient ischemic attack (TIA), and cerebral infarction without residual deficits: Secondary | ICD-10-CM

## 2017-06-22 DIAGNOSIS — I208 Other forms of angina pectoris: Secondary | ICD-10-CM

## 2017-06-22 MED ORDER — ACETAMINOPHEN-CODEINE #3 300-30 MG PO TABS: 1.0000 | ORAL_TABLET | Freq: Four times a day (QID) | ORAL | 0 refills | Status: DC | PRN

## 2017-06-22 NOTE — Progress Notes (Signed)
Cardiac Individual Treatment Plan  Patient Details  Name: Candace [name] MRN: [id number] Date of Birth: [birth year] Referring Provider:     Cardiac Rehab from 06/02/2017 in [facility] Cardiac and Pulmonary Rehab  Referring Provider  Rosanna Randy      Initial Encounter Date:    Cardiac Rehab from 06/02/2017 in [facility] Cardiac and Pulmonary Rehab  Date  06/02/17  Referring Provider  Rosanna Randy      Visit Diagnosis: Chronic stable angina ([place])  S/P PTCA (percutaneous transluminal coronary angioplasty)  Patient's Home Medications on Admission:  Current Outpatient Medications:  .  aspirin 81 MG chewable tablet, Chew 1 tablet (81 mg total) by mouth daily., Disp: 90 tablet, Rfl: 3 .  Coenzyme Q10 (CO Q 10) 100 MG CAPS, Take 100 mg by mouth daily., Disp: 30 capsule, Rfl: 12 .  diazepam (VALIUM) 2 MG tablet, Take 1 tablet by mouth 2 (two) times daily as needed., Disp: , Rfl:  .  hydrochlorothiazide (HYDRODIURIL) 25 MG tablet, Take 1 tablet (25 mg total) daily by mouth. (Patient not taking: Reported on 05/19/2017), Disp: 90 tablet, Rfl: 3 .  isosorbide mononitrate (IMDUR) 30 MG 24 hr tablet, Take 2 tablets (60 mg total) by mouth daily., Disp: 60 tablet, Rfl: 12 .  loperamide (IMODIUM) 2 MG capsule, Take by mouth as needed for diarrhea or loose stools., Disp: , Rfl:  .  meloxicam (MOBIC) 7.5 MG tablet, Take 1 tablet (7.5 mg total) by mouth 2 (two) times daily as needed for pain., Disp: 60 tablet, Rfl: 0 .  metFORMIN (GLUCOPHAGE) 1000 MG tablet, Take 1 tablet (1,000 mg total) by mouth 2 (two) times daily with a meal., Disp: 180 tablet, Rfl: 3 .  metoprolol tartrate (LOPRESSOR) 25 MG tablet, Take 25 mg by mouth 2 (two) times daily., Disp: , Rfl:  .  metoprolol tartrate (LOPRESSOR) 50 MG tablet, TAKE 1 TABLET BY MOUTH TWICE A DAY FOR HIGH BLOOD PRESSURE, Disp: 60 tablet, Rfl: 12 .  nitroGLYCERIN (NITROSTAT) 0.4 MG SL tablet, Place 1 tablet (0.4 mg total) under the tongue every 5 (five) minutes as needed for chest  pain., Disp: 30 tablet, Rfl: 0 .  ondansetron (ZOFRAN-ODT) 8 MG disintegrating tablet, DISSOLVE 1 TABLET ON THE TONGUE EVERY 8 HOURS AS NEEDED FOR NAUSEA OR VOMITING, Disp: 30 tablet, Rfl: 3 .  rosuvastatin (CRESTOR) 10 MG tablet, Take 1 tablet (10 mg total) by mouth daily., Disp: 90 tablet, Rfl: 3 .  tiZANidine (ZANAFLEX) 2 MG tablet, Take 2 mg by mouth every 6 (six) hours as needed for muscle spasms (patient is to titrate up to 6 at bedtime)., Disp: , Rfl:   Past Medical History: Past Medical History:  Diagnosis Date  . Allergy   . Anemia   . Anxiety   . Arrhythmia   . Arthritis   . Coronary artery disease   . Depression   . Diabetes mellitus without complication ([place])   . Dyspnea    doe  . Dysrhythmia   . GERD (gastroesophageal reflux disease)   . Headache   . Hyperlipidemia   . Hypertension   . Myocardial infarction ([place])    2016  . Myocardial infarction with cardiac rehabilitation [facility])    MI 2016/ CABG 8/17    FINISHED CARDIAC REHAB 3 WEEKS AGO  . Panic attack   . Reflux   . Stroke ([place])   . Voice tremor     Tobacco Use: Social History   Tobacco Use  Smoking Status Former Smoker  . Packs/day: 0.50  .  Types: Cigarettes  . Last attempt to quit: 10/07/2001  . Years since quitting: 15.7  Smokeless Tobacco Never Used    Labs: Recent Chemical engineer    Labs for ITP Cardiac and Pulmonary Rehab Latest Ref Rng & Units 11/11/2015 08/18/2016 10/01/2016 12/23/2016 03/24/2017   Cholestrol 100 - 199 mg/dL - - 259(H) - -   LDLCALC 0 - 99 mg/dL - - 173(H) - -   HDL >39 mg/dL - - 66 - -   Trlycerides 0 - 149 mg/dL - - 100 - -   Hemoglobin A1c <5.7 % of total Hgb - 8.1 6.9(H) 7.3(H) 7.8(H)   PHART 7.350 - 7.450 - - - - -   PCO2ART 35.0 - 45.0 mmHg - - - - -   HCO3 20.0 - 24.0 mEq/L - - - - -   TCO2 0 - 100 mmol/L 26 - - - -   O2SAT % - - - - -       Exercise Target Goals:    Exercise Program Goal: Individual exercise prescription set using results from initial 6  min walk test and THRR while considering  patient's activity barriers and safety.   Exercise Prescription Goal: Initial exercise prescription builds to 30-45 minutes a day of aerobic activity, 2-3 days per week.  Home exercise guidelines will be given to patient during program as part of exercise prescription that the participant will acknowledge.  Activity Barriers & Risk Stratification: Activity Barriers & Cardiac Risk Stratification - 06/02/17 1340      Activity Barriers & Cardiac Risk Stratification   Activity Barriers  Back Problems;Deconditioning;Shortness of Breath;History of Falls;Chest Pain/Angina    Cardiac Risk Stratification  High       6 Minute Walk: 6 Minute Walk    Row Name 06/02/17 1427         6 Minute Walk   Distance  942 feet     Walk Time  6 minutes     # of Rest Breaks  0     MPH  1.78     METS  3.14     RPE  15     Perceived Dyspnea   2     VO2 Peak  10.99     Symptoms  Yes (comment)     Comments  legs fatigued     Resting HR  84 bpm     Resting BP  120/80     Resting Oxygen Saturation   96 %     Exercise Oxygen Saturation  during 6 min walk  99 %     Max Ex. HR  128 bpm     Max Ex. BP  192/104     2 Minute Post BP  148/74        Oxygen Initial Assessment:   Oxygen Re-Evaluation:   Oxygen Discharge (Final Oxygen Re-Evaluation):   Initial Exercise Prescription: Initial Exercise Prescription - 06/02/17 1400      Date of Initial Exercise RX and Referring Provider   Date  06/02/17    Referring Provider  Rosanna Randy      Recumbant Bike   Level  2    RPM  60    Watts  28    Minutes  15    METs  3      NuStep   Level  3    SPM  80    Minutes  15    METs  3      Track  Laps  45    Minutes  15    METs  3      Prescription Details   Frequency (times per week)  3    Duration  Progress to 30 minutes of continuous aerobic without signs/symptoms of physical distress      Intensity   THRR 40-80% of Max Heartrate  112-140    Ratings  of Perceived Exertion  11-13    Perceived Dyspnea  0-4      Resistance Training   Training Prescription  Yes    Weight  3 lb    Reps  10-15       Perform Capillary Blood Glucose checks as needed.  Exercise Prescription Changes: Exercise Prescription Changes    Row Name 06/02/17 1400 06/14/17 1500 06/16/17 0900         Response to Exercise   Blood Pressure (Admit)  120/80  106/64  -     Blood Pressure (Exercise)  192/104  140/80  -     Blood Pressure (Exit)  148/74  124/60  -     Heart Rate (Admit)  85 bpm  86 bpm  -     Heart Rate (Exercise)  128 bpm  135 bpm  -     Heart Rate (Exit)  96 bpm  105 bpm  -     Oxygen Saturation (Admit)  96 %  -  -     Oxygen Saturation (Exit)  99 %  -  -     Rating of Perceived Exertion (Exercise)  15  16  -     Symptoms  -  none  -     Comments  -  first full day of exercise  -     Duration  -  Continue with 30 min of aerobic exercise without signs/symptoms of physical distress.  -     Intensity  -  THRR unchanged  -       Progression   Progression  -  Continue to progress workloads to maintain intensity without signs/symptoms of physical distress.  -     Average METs  -  2.31  -       Resistance Training   Training Prescription  -  Yes  -     Weight  -  3 lb  -     Reps  -  10-15  -       Interval Training   Interval Training  -  No  -       NuStep   Level  -  3  -     Minutes  -  15  -     METs  -  2.7  -       Track   Laps  -  20  -     Minutes  -  15  -     METs  -  1.92  -       Home Exercise Plan   Plans to continue exercise at  -  -  Home (comment) walk and dance     Frequency  -  -  Add 3 additional days to program exercise sessions.     Initial Home Exercises Provided  -  -  06/16/17        Exercise Comments: Exercise Comments    Row Name 06/07/17 0947           Exercise Comments  Reviewed home exercise with pt  today.  Pt plans to walk at home and stores for exercise.  Reviewed THR, pulse, RPE, sign and  symptoms, and when to call 911 or MD.  Also discussed weather considerations and indoor options.  Pt voiced understanding.          Exercise Goals and Review: Exercise Goals    Row Name 06/02/17 1426             Exercise Goals   Increase Physical Activity  Yes       Intervention  Provide advice, education, support and counseling about physical activity/exercise needs.;Develop an individualized exercise prescription for aerobic and resistive training based on initial evaluation findings, risk stratification, comorbidities and participant's personal goals.       Expected Outcomes  Short Term: Attend rehab on a regular basis to increase amount of physical activity.;Long Term: Add in home exercise to make exercise part of routine and to increase amount of physical activity.;Long Term: Exercising regularly at least 3-5 days a week.       Increase Strength and Stamina  Yes       Intervention  Provide advice, education, support and counseling about physical activity/exercise needs.;Develop an individualized exercise prescription for aerobic and resistive training based on initial evaluation findings, risk stratification, comorbidities and participant's personal goals.       Expected Outcomes  Short Term: Increase workloads from initial exercise prescription for resistance, speed, and METs.;Short Term: Perform resistance training exercises routinely during rehab and add in resistance training at home;Long Term: Improve cardiorespiratory fitness, muscular endurance and strength as measured by increased METs and functional capacity (6MWT)       Able to understand and use rate of perceived exertion (RPE) scale  Yes       Intervention  Provide education and explanation on how to use RPE scale       Expected Outcomes  Short Term: Able to use RPE daily in rehab to express subjective intensity level;Long Term:  Able to use RPE to guide intensity level when exercising independently       Able to understand and  use Dyspnea scale  Yes       Intervention  Provide education and explanation on how to use Dyspnea scale       Expected Outcomes  Short Term: Able to use Dyspnea scale daily in rehab to express subjective sense of shortness of breath during exertion;Long Term: Able to use Dyspnea scale to guide intensity level when exercising independently       Knowledge and understanding of Target Heart Rate Range (THRR)  Yes       Intervention  Provide education and explanation of THRR including how the numbers were predicted and where they are located for reference       Expected Outcomes  Short Term: Able to state/look up THRR;Long Term: Able to use THRR to govern intensity when exercising independently;Short Term: Able to use daily as guideline for intensity in rehab       Able to check pulse independently  Yes       Intervention  Provide education and demonstration on how to check pulse in carotid and radial arteries.;Review the importance of being able to check your own pulse for safety during independent exercise       Expected Outcomes  Short Term: Able to explain why pulse checking is important during independent exercise;Long Term: Able to check pulse independently and accurately       Understanding of Exercise Prescription  Yes  Intervention  Provide education, explanation, and written materials on patient's individual exercise prescription       Expected Outcomes  Short Term: Able to explain program exercise prescription;Long Term: Able to explain home exercise prescription to exercise independently          Exercise Goals Re-Evaluation : Exercise Goals Re-Evaluation    Row Name 06/07/17 0947 06/16/17 0930           Exercise Goal Re-Evaluation   Exercise Goals Review  Knowledge and understanding of Target Heart Rate Range (THRR);Able to understand and use rate of perceived exertion (RPE) scale;Understanding of Exercise Prescription  Understanding of Exercise Prescription;Increase Physical  Activity;Increase Strength and Stamina;Knowledge and understanding of Target Heart Rate Range (THRR);Able to understand and use rate of perceived exertion (RPE) scale;Able to check pulse independently      Comments  Reviewed RPE scale, THR and program prescription with pt today.  Pt voiced understanding and was given a copy of goals to take home.   Reviewed home exercise with pt today.  Pt plans to walk and dance for exercise.  Reviewed THR, pulse, RPE, sign and symptoms, NTG use, and when to call 911 or MD.  Also discussed weather considerations and indoor options.  Pt voiced understanding.      Expected Outcomes  Short: Use RPE daily to regulate intensity.  Long: Follow program prescription in THR  Short: Add in one day of walking and dancing at home.  Long: Follow home exercise prescription.          Discharge Exercise Prescription (Final Exercise Prescription Changes): Exercise Prescription Changes - 06/16/17 0900      Home Exercise Plan   Plans to continue exercise at  Home (comment) walk and dance    Frequency  Add 3 additional days to program exercise sessions.    Initial Home Exercises Provided  06/16/17       Nutrition:  Target Goals: Understanding of nutrition guidelines, daily intake of sodium <1529m, cholesterol <2060m calories 30% from fat and 7% or less from saturated fats, daily to have 5 or more servings of fruits and vegetables.  Biometrics: Pre Biometrics - 06/02/17 1425      Pre Biometrics   Height  5' 2.75" (1.594 m)    Weight  173 lb 8 oz (78.7 kg)    Waist Circumference  38 inches    Hip Circumference  42.5 inches    Waist to Hip Ratio  0.89 %    BMI (Calculated)  30.97    Single Leg Stand  4.26 seconds        Nutrition Therapy Plan and Nutrition Goals: Nutrition Therapy & Goals - 06/09/17 0957      Nutrition Therapy   Diet  TLC/ DASH    Protein (specify units)  11oz    Fiber  25 grams    Whole Grain Foods  3 servings    Saturated Fats  14 max. grams     Fruits and Vegetables  4 servings/day 8 ideal    Sodium  1500 grams      Personal Nutrition Goals   Nutrition Goal  Consume sources of protein more regularly, ideally daily she agrees to drink more milk or cheese    Personal Goal #2  Consume Glucerna nutritonal drinks more regularly, use as a meal replacement or snack between meals eats 1-2 meals/day typically    Personal Goal #3  Eat foods that have a stong smell such as soups or that have strong  flavorings such as citrus or salt-free herbs/ spices    Comments  patient c/o decreased appetite and taste changes. she has made changes to her diet in the past year including eating more fruits and vegetables, decreasing her sodium intake, and eating less fried foods/ red meats      Intervention Plan   Intervention  Prescribe, educate and counsel regarding individualized specific dietary modifications aiming towards targeted core components such as weight, hypertension, lipid management, diabetes, heart failure and other comorbidities.    Expected Outcomes  Short Term Goal: Understand basic principles of dietary content, such as calories, fat, sodium, cholesterol and nutrients.;Short Term Goal: A plan has been developed with personal nutrition goals set during dietitian appointment.;Long Term Goal: Adherence to prescribed nutrition plan.       Nutrition Assessments: Nutrition Assessments - 06/02/17 1345      MEDFICTS Scores   Pre Score  10       Nutrition Goals Re-Evaluation:   Nutrition Goals Discharge (Final Nutrition Goals Re-Evaluation):   Psychosocial: Target Goals: Acknowledge presence or absence of significant depression and/or stress, maximize coping skills, provide positive support system. Participant is able to verbalize types and ability to use techniques and skills needed for reducing stress and depression.   Initial Review & Psychosocial Screening: Initial Psych Review & Screening - 06/02/17 1342      Initial Review    Current issues with  Current Stress Concerns    Source of Stress Concerns  Unable to participate in former interests or hobbies;Unable to perform yard/household activities      [place]?  Yes      Barriers   Psychosocial barriers to participate in program  There are no identifiable barriers or psychosocial needs.;The patient should benefit from training in stress management and relaxation.      Screening Interventions   Interventions  Encouraged to exercise;Provide feedback about the scores to participant;To provide support and resources with identified psychosocial needs    Expected Outcomes  Short Term goal: Utilizing psychosocial counselor, staff and physician to assist with identification of specific Stressors or current issues interfering with healing process. Setting desired goal for each stressor or current issue identified.;Long Term Goal: Stressors or current issues are controlled or eliminated.;Short Term goal: Identification and review with participant of any Quality of Life or Depression concerns found by scoring the questionnaire.;Long Term goal: The participant improves quality of Life and PHQ9 Scores as seen by post scores and/or verbalization of changes       Quality of Life Scores:  Quality of Life - 06/02/17 1204      Quality of Life Scores   Health/Function Pre  9.47 %    Socioeconomic Pre  18058 %    Psych/Spiritual Pre  18.86 %    Family Pre  21.9 %    GLOBAL Pre  15 %      Scores of 19 and below usually indicate a poorer quality of life in these areas.  A difference of  2-3 points is a clinically meaningful difference.  A difference of 2-3 points in the total score of the Quality of Life Index has been associated with significant improvement in overall quality of life, self-image, physical symptoms, and general health in studies assessing change in quality of life.  PHQ-9: Recent Review Flowsheet Data    Depression screen [facility] 2/9  06/02/2017 04/25/2017 11/04/2016 07/28/2016 03/16/2016   Decreased Interest 2 0 0 3 1   Down, Depressed,  Hopeless 1 0 0 0 1   PHQ - 2 Score 3 0 0 3 2   Altered sleeping 3 3 0 0 0   Tired, decreased energy 2 3 0 3 1   Change in appetite 2 2 0 3 1   Feeling bad or failure about yourself  1 0 0 0 0   Trouble concentrating 1 0 0 0 0   Moving slowly or fidgety/restless 2 0 0 0 0   Suicidal thoughts 0 2  - 0 0   PHQ-9 Score 14 10 0 9 4   Difficult doing work/chores Not difficult at all Somewhat difficult - - Not difficult at all     Interpretation of Total Score  Total Score Depression Severity:  1-4 = Minimal depression, 5-9 = Mild depression, 10-14 = Moderate depression, 15-19 = Moderately severe depression, 20-27 = Severe depression   Psychosocial Evaluation and Intervention:   Psychosocial Re-Evaluation:   Psychosocial Discharge (Final Psychosocial Re-Evaluation):   Vocational Rehabilitation: Provide vocational rehab assistance to qualifying candidates.   Vocational Rehab Evaluation & Intervention: Vocational Rehab - 06/02/17 1206      Initial Vocational Rehab Evaluation & Intervention   Assessment shows need for Vocational Rehabilitation  No       Education: Education Goals: Education classes will be provided on a variety of topics geared toward better understanding of heart health and risk factor modification. Participant will state understanding/return demonstration of topics presented as noted by education test scores.  Learning Barriers/Preferences: Learning Barriers/Preferences - 06/02/17 1343      Learning Barriers/Preferences   Learning Barriers  Exercise Concerns Exertion causes SOB.   Chronic headache from back problems    Learning Preferences  None       Education Topics:  AED/CPR: - Group verbal and written instruction with the use of models to demonstrate the basic use of the AED with the basic ABC's of resuscitation.   General Nutrition Guidelines/Fats  and Fiber: -Group instruction provided by verbal, written material, models and posters to present the general guidelines for heart healthy nutrition. Gives an explanation and review of dietary fats and fiber.   Cardiac Rehab from 04/08/2016 in [facility] Cardiac and Pulmonary Rehab  Date  04/06/16  Educator  PI  Instruction Review Code (retired)  2- meets goals/outcomes      Controlling Sodium/Reading Food Labels: -Group verbal and written material supporting the discussion of sodium use in heart healthy nutrition. Review and explanation with models, verbal and written materials for utilization of the food label.   Cardiac Rehab from 04/08/2016 in [facility] Cardiac and Pulmonary Rehab  Date  02/16/16  Educator  PI  Instruction Review Code (retired)  2- meets goals/outcomes      Exercise Physiology & General Exercise Guidelines: - Group verbal and written instruction with models to review the exercise physiology of the cardiovascular system and associated critical values. Provides general exercise guidelines with specific guidelines to those with heart or lung disease.    Cardiac Rehab from 04/08/2016 in [facility] Cardiac and Pulmonary Rehab  Date  02/05/16  Educator  [facility]  Instruction Review Code (retired)  2- meets goals/outcomes      Aerobic Exercise & Resistance Training: - Gives group verbal and written instruction on the various components of exercise. Focuses on aerobic and resistive training programs and the benefits of this training and how to safely progress through these programs..   Cardiac Rehab from 04/08/2016 in [facility] Cardiac and Pulmonary Rehab  Date  02/24/16  Educator  [facility]  Instruction Review Code (retired)  2- Statistician, Balance, Mind/Body Relaxation: Provides group verbal/written instruction on the benefits of flexibility and balance training, including mind/body exercise modes such as yoga, pilates and tai chi.  Demonstration and skill practice provided.    Cardiac Rehab from 04/08/2016 in [facility] Cardiac and Pulmonary Rehab  Date  02/26/16  Educator  [facility]  Instruction Review Code (retired)  2- meets goals/outcomes      Stress and Anxiety: - Provides group verbal and written instruction about the health risks of elevated stress and causes of high stress.  Discuss the correlation between heart/lung disease and anxiety and treatment options. Review healthy ways to manage with stress and anxiety.   Depression: - Provides group verbal and written instruction on the correlation between heart/lung disease and depressed mood, treatment options, and the stigmas associated with seeking treatment.   Cardiac Rehab from 04/08/2016 in [facility] Cardiac and Pulmonary Rehab  Date  04/08/16  Educator  TS  Instruction Review Code (retired)  2- meets Designer, fashion/clothing & Physiology of the Heart: - Group verbal and written instruction and models provide basic cardiac anatomy and physiology, with the coronary electrical and arterial systems. Review of Valvular disease and Heart Failure   Cardiac Rehab from 04/08/2016 in [facility] Cardiac and Pulmonary Rehab  Date  03/02/16  Educator  SB  Instruction Review Code (retired)  2- meets goals/outcomes      Cardiac Procedures: - Group verbal and written instruction to review commonly prescribed medications for heart disease. Reviews the medication, class of the drug, and side effects. Includes the steps to properly store meds and maintain the prescription regimen. (beta blockers and nitrates)   Cardiac Rehab from 04/08/2016 in [facility] Cardiac and Pulmonary Rehab  Date  03/09/16  Educator  SB  Instruction Review Code (retired)  2- meets goals/outcomes      Cardiac Medications I: - Group verbal and written instruction to review commonly prescribed medications for heart disease. Reviews the medication, class of the drug, and side effects. Includes the steps to properly store meds and maintain the prescription regimen.    Cardiac Rehab from 06/16/2017 in [facility] Cardiac and Pulmonary Rehab  Date  06/07/17  Educator  SB  Instruction Review Code  1- Verbalizes Understanding      Cardiac Medications II: -Group verbal and written instruction to review commonly prescribed medications for heart disease. Reviews the medication, class of the drug, and side effects. (all other drug classes)    Go Sex-Intimacy & Heart Disease, Get SMART - Goal Setting: - Group verbal and written instruction through game format to discuss heart disease and the return to sexual intimacy. Provides group verbal and written material to discuss and apply goal setting through the application of the S.M.A.R.T. Method.   Cardiac Rehab from 04/08/2016 in [facility] Cardiac and Pulmonary Rehab  Date  03/09/16  Educator  SB  Instruction Review Code (retired)  2- meets goals/outcomes      Other Matters of the Heart: - Provides group verbal, written materials and models to describe Stable Angina and Peripheral Artery. Includes description of the disease process and treatment options available to the cardiac patient.   Exercise & Equipment Safety: - Individual verbal instruction and demonstration of equipment use and safety with use of the equipment.   Cardiac Rehab from 06/16/2017 in [facility] Cardiac and Pulmonary Rehab  Date  06/02/17  Educator  SB  Instruction  Review Code  1- Verbalizes Understanding      Infection Prevention: - Provides verbal and written material to individual with discussion of infection control including proper hand washing and proper equipment cleaning during exercise session.   Cardiac Rehab from 06/16/2017 in [facility] Cardiac and Pulmonary Rehab  Date  06/02/17  Educator  SB  Instruction Review Code  1- Verbalizes Understanding      Falls Prevention: - Provides verbal and written material to individual with discussion of falls prevention and safety.   Cardiac Rehab from 06/16/2017 in [facility] Cardiac and Pulmonary Rehab  Date   06/02/17  Educator  SB  Instruction Review Code  1- Verbalizes Understanding      Diabetes: - Individual verbal and written instruction to review signs/symptoms of diabetes, desired ranges of glucose level fasting, after meals and with exercise. Acknowledge that pre and post exercise glucose checks will be done for 3 sessions at entry of program.   Cardiac Rehab from 04/08/2016 in [facility] Cardiac and Pulmonary Rehab  Date  01/12/16  Educator  C. ENterkinRN  Instruction Review Code (retired)  2- meets goals/outcomes      Know Your Numbers and Risk Factors: -Group verbal and written instruction about important numbers in your health.  Discussion of what are risk factors and how they play a role in the disease process.  Review of Cholesterol, Blood Pressure, Diabetes, and BMI and the role they play in your overall health.   Sleep Hygiene: -Provides group verbal and written instruction about how sleep can affect your health.  Define sleep hygiene, discuss sleep cycles and impact of sleep habits. Review good sleep hygiene tips.    Other: -Provides group and verbal instruction on various topics (see comments)   Knowledge Questionnaire Score: Knowledge Questionnaire Score - 06/02/17 1344      Knowledge Questionnaire Score   Pre Score  -- Reviewed correct responses with Jadelin today. She vebalized understanding of the responses and did not have any questions.        Core Components/Risk Factors/Patient Goals at Admission: Personal Goals and Risk Factors at Admission - 06/02/17 1341      Core Components/Risk Factors/Patient Goals on Admission    Weight Management  Yes;Obesity    Intervention  Weight Management: Develop a combined nutrition and exercise program designed to reach desired caloric intake, while maintaining appropriate intake of nutrient and fiber, sodium and fats, and appropriate energy expenditure required for the weight goal.    Admit Weight  173 lb (78.5 kg)    Goal Weight:  Short Term  171 lb (77.6 kg)    Goal Weight: Long Term  159 lb (72.1 kg)    Expected Outcomes  Short Term: Continue to assess and modify interventions until short term weight is achieved;Long Term: Adherence to nutrition and physical activity/exercise program aimed toward attainment of established weight goal;Weight Loss: Understanding of general recommendations for a balanced deficit meal plan, which promotes 1-2 lb weight loss per week and includes a negative energy balance of [phone] kcal/d    Diabetes  Yes    Intervention  Provide education about signs/symptoms and action to take for hypo/hyperglycemia.;Provide education about proper nutrition, including hydration, and aerobic/resistive exercise prescription along with prescribed medications to achieve blood glucose in normal ranges: Fasting glucose 65-99 mg/dL    Expected Outcomes  Short Term: Participant verbalizes understanding of the signs/symptoms and immediate care of hyper/hypoglycemia, proper foot care and importance of medication, aerobic/resistive exercise and nutrition plan for blood glucose control.;Long  Term: Attainment of HbA1C < 7%.    Hypertension  Yes    Intervention  Provide education on lifestyle modifcations including regular physical activity/exercise, weight management, moderate sodium restriction and increased consumption of fresh fruit, vegetables, and low fat dairy, alcohol moderation, and smoking cessation.;Monitor prescription use compliance.    Expected Outcomes  Short Term: Continued assessment and intervention until BP is < 140/86m HG in hypertensive participants. < 130/820mHG in hypertensive participants with diabetes, heart failure or chronic kidney disease.;Long Term: Maintenance of blood pressure at goal levels.    Lipids  Yes    Intervention  Provide education and support for participant on nutrition & aerobic/resistive exercise along with prescribed medications to achieve LDL <7017mHDL >55m25m  Expected  Outcomes  Short Term: Participant states understanding of desired cholesterol values and is compliant with medications prescribed. Participant is following exercise prescription and nutrition guidelines.;Long Term: Cholesterol controlled with medications as prescribed, with individualized exercise RX and with personalized nutrition plan. Value goals: LDL < 70mg25mL > 40 mg.       Core Components/Risk Factors/Patient Goals Review:    Core Components/Risk Factors/Patient Goals at Discharge (Final Review):    ITP Comments: ITP Comments    Row Name 06/02/17 1336 06/22/17 0556 06/22/17 0632       ITP Comments  New orientation after recent angioplasty and chronic stable angina diagnosis.  ITP completed and sent to Dr MilleSabra Heckreview, changes as needed and signature. Documentation of diagnosis can be found in [facility] and 04/25/2017 visits,   Came to class Tuesday 06/21/2017 with complaints of ankles swelling and increased shortness of breath since last Thursday. Advised she see her cardiologist before returning to program. Staff member took her via wheelchair to her cardiology office and she was able to see the NP that morning.  Brilinta was discontinued and PLavix added. No restrictions to return to class  30 Day review. Continue with ITP unless directed changes per Medical Director review.          Comments:

## 2017-06-22 NOTE — Progress Notes (Signed)
Pt is having pain since feb. With off and on discharge with odor and color sometimes.

## 2017-06-22 NOTE — Progress Notes (Signed)
GYNECOLOGY CLINIC PROGRESS NOTE Subjective:    Candace [name] is a 67 y.o. [id number] postmenopausal female who presents as a referral from [facility] (Dr. Miguel Aschoff) with uterine fibroids and pelvic pain. Also notes an increase in her vaginal discharge, but denies any odor, itching, or burning. Patient notes that her pain has been intermittent, ongoing for over 1 year.  States that she has been treated in the past for possible UTI as the cause of her pain.  She is currently taking Mobic, but she notes that this does not do much to help her pain. She denies PMB.   Patient denies prior knowledge of her uterine fibroids until she was recently admitted to the hospital ([facility] in [place]) for chest pain and possible TIA.  She underwent workup which included multiple imaging procedures (including a CT of abdomen/pelvis where her fibroid uterus was noted), and had a cardiac catheterization performed. She was diagnosed with transient cerebral ischemic attack and chronic stable angina. She has a h/o CABG in 2011. She underwent a cardiac catheterization during this recent hospitalization.   Gynecologic History:  Current contraception: post menopausal status History of abnormal Pap smear: no.  Last pap smear was 02/2017.  Family history of uterine or ovarian cancer: no History of abnormal mammogram: no.  Last mammogram was in 05/2015.     Past Medical History:  Diagnosis Date  . Allergy   . Anemia   . Anxiety   . Arrhythmia   . Arthritis   . Coronary artery disease   . Depression   . Diabetes mellitus without complication (JAARS)   . Dyspnea    doe  . Dysrhythmia   . GERD (gastroesophageal reflux disease)   . Headache   . Hyperlipidemia   . Hypertension   . Myocardial infarction ([place])    2016  . Myocardial infarction with cardiac rehabilitation [facility])    MI 2016/ CABG 8/17    FINISHED CARDIAC REHAB 3 WEEKS AGO  . Panic attack   . Reflux   .  Stroke ([place])   . Voice tremor     Family History  Problem Relation Age of Onset  . Cancer Father   . Hypertension Father   . Heart disease Father   . Cancer Mother   . Hypertension Mother   . Cancer Sister   . Breast cancer Sister 58  . Breast cancer Sister 15  . Lung cancer Brother   . Pancreatic cancer Brother     OB History  Gravida Para Term Preterm AB Living  5 5 3 2       SAB TAB Ectopic Multiple Live Births          2    # Outcome Date GA Lbr Len/2nd Weight Sex Delivery Anes PTL Lv  5 Term 75    M CS-Unspec     4 Term 28    F CS-Unspec     3 Preterm 46    M    ND  2 Preterm 55    M    ND  1 Term 1973    F Vag-Breech       Past Surgical History:  Procedure Laterality Date  . APPENDECTOMY    . ARTERY BIOPSY Right 04/26/2016   Procedure: BIOPSY TEMPORAL ARTERY;  Surgeon: Margaretha Sheffield, MD;  Location: [facility] ORS;  Service: ENT;  Laterality: Right;  . CARDIAC CATHETERIZATION N/A 11/06/2015   Procedure: Left Heart Cath and Coronary Angiography;  Surgeon: Corey Skains, MD;  Location: [place] CV LAB;  Service: Cardiovascular;  Laterality: N/A;  . CESAREAN SECTION    . CORONARY ARTERY BYPASS GRAFT N/A 11/10/2015   Procedure: CORONARY ARTERY BYPASS GRAFTING (CABG), ON PUMP, TIMES FOUR, USING LEFT INTERNAL MAMMARY ARTERY, BILATERAL GREATER SAPHENOUS VEINS HARVESTED ENDOSCOPICALLY;  Surgeon: [name], MD;  Location: [place];  Service: Open Heart Surgery;  Laterality: N/A;  LIMA-LAD; SEQ SVG-OM1-OM2; SVG-PL  . CORONARY STENT INTERVENTION N/A 08/05/2016   Procedure: Coronary Stent Intervention;  Surgeon: Isaias Cowman, MD;  Location: [place] CV LAB;  Service: Cardiovascular;  Laterality: N/A;  . LEFT HEART CATH AND CORONARY ANGIOGRAPHY N/A 08/05/2016   Procedure: Left Heart Cath and Coronary Angiography;  Surgeon: Isaias Cowman, MD;  Location: [place] CV LAB;  Service: Cardiovascular;  Laterality: N/A;  . TEE WITHOUT CARDIOVERSION N/A  11/10/2015   Procedure: TRANSESOPHAGEAL ECHOCARDIOGRAM (TEE);  Surgeon: [name], MD;  Location: [place];  Service: Open Heart Surgery;  Laterality: N/A;  . TUBAL LIGATION       Social History   Socioeconomic History  . Marital status: Divorced    Spouse name: Not on file  . Number of children: Not on file  . Years of education: Not on file  . Highest education level: Not on file  Occupational History  . Not on file  Social Needs  . Financial resource strain: Not on file  . Food insecurity:    Worry: Not on file    Inability: Not on file  . Transportation needs:    Medical: Not on file    Non-medical: Not on file  Tobacco Use  . Smoking status: Former Smoker    Packs/day: 0.50    Types: Cigarettes    Last attempt to quit: 10/07/2001    Years since quitting: 15.7  . Smokeless tobacco: Never Used  Substance and Sexual Activity  . Alcohol use: No    Alcohol/week: 0.0 oz  . Drug use: No  . Sexual activity: Not Currently  Lifestyle  . Physical activity:    Days per week: Not on file    Minutes per session: Not on file  . Stress: Not on file  Relationships  . Social connections:    Talks on phone: Not on file    Gets together: Not on file    Attends religious service: Not on file    Active member of club or organization: Not on file    Attends meetings of clubs or organizations: Not on file    Relationship status: Not on file  . Intimate partner violence:    Fear of current or ex partner: Not on file    Emotionally abused: Not on file    Physically abused: Not on file    Forced sexual activity: Not on file  Other Topics Concern  . Not on file  Social History Narrative  . Not on file    Current Outpatient Medications on File Prior to Visit  Medication Sig Dispense Refill  . aspirin 81 MG chewable tablet Chew 1 tablet (81 mg total) by mouth daily. 90 tablet 3  . Coenzyme Q10 (CO Q 10) 100 MG CAPS Take 100 mg by mouth daily. 30 capsule 12  . diazepam  (VALIUM) 2 MG tablet Take 1 tablet by mouth 2 (two) times daily as needed.    . isosorbide mononitrate (IMDUR) 30 MG 24 hr tablet Take 2 tablets (60 mg total) by mouth daily. 60 tablet  12  . loperamide (IMODIUM) 2 MG capsule Take by mouth as needed for diarrhea or loose stools.    . metFORMIN (GLUCOPHAGE) 1000 MG tablet Take 1 tablet (1,000 mg total) by mouth 2 (two) times daily with a meal. 180 tablet 3  . metoprolol tartrate (LOPRESSOR) 25 MG tablet Take 25 mg by mouth 2 (two) times daily.    . metoprolol tartrate (LOPRESSOR) 50 MG tablet TAKE 1 TABLET BY MOUTH TWICE A DAY FOR HIGH BLOOD PRESSURE 60 tablet 12  . nitroGLYCERIN (NITROSTAT) 0.4 MG SL tablet Place 1 tablet (0.4 mg total) under the tongue every 5 (five) minutes as needed for chest pain. 30 tablet 0  . ondansetron (ZOFRAN-ODT) 8 MG disintegrating tablet DISSOLVE 1 TABLET ON THE TONGUE EVERY 8 HOURS AS NEEDED FOR NAUSEA OR VOMITING 30 tablet 3  . rosuvastatin (CRESTOR) 10 MG tablet Take 1 tablet (10 mg total) by mouth daily. 90 tablet 3  . tiZANidine (ZANAFLEX) 2 MG tablet Take 2 mg by mouth every 6 (six) hours as needed for muscle spasms (patient is to titrate up to 6 at bedtime).    . hydrochlorothiazide (HYDRODIURIL) 25 MG tablet Take 1 tablet (25 mg total) daily by mouth. (Patient not taking: Reported on 05/19/2017) 90 tablet 3   No current facility-administered medications on file prior to visit.     Allergies  Allergen Reactions  . Lisinopril     cough  . Penicillins Swelling, Rash and Other (See Comments)    Has patient had a PCN reaction causing immediate rash, facial/tongue/throat swelling, SOB or lightheadedness with hypotension: Yes Has patient had a PCN reaction causing severe rash involving mucus membranes or skin necrosis: Yes Has patient had a PCN reaction that required hospitalization No Has patient had a PCN reaction occurring within the last 10 years: Yes If all of the above answers are "NO", then may proceed with  Cephalosporin use.     Allergies  Allergen Reactions  . Lisinopril     cough  . Penicillins Swelling, Rash and Other (See Comments)    Has patient had a PCN reaction causing immediate rash, facial/tongue/throat swelling, SOB or lightheadedness with hypotension: Yes Has patient had a PCN reaction causing severe rash involving mucus membranes or skin necrosis: Yes Has patient had a PCN reaction that required hospitalization No Has patient had a PCN reaction occurring within the last 10 years: Yes If all of the above answers are "NO", then may proceed with Cephalosporin use.      Review of Systems A comprehensive review of systems was negative except for: Neurological: positive for headaches (has had ongoing x 2 years, has undergone extensive workup, including biopsy to r/o temporal arteritis).    Objective:     BP 133/81   Pulse 73   Ht 5\' 2"  (1.575 m)   Wt 172 lb 9.6 oz (78.3 kg)   BMI 31.57 kg/m  BP 133/81   Pulse 73   Ht 5\' 2"  (1.575 m)   Wt 172 lb 9.6 oz (78.3 kg)   BMI 31.57 kg/m  General appearance: alert and no distress Lungs: clear to auscultation bilaterally Heart: regular rate and rhythm, S1, S2 normal, no murmur, click, rub or gallop Abdomen: normal findings: bowel sounds normal and soft and abnormal findings:  mass, located in the lower abdomen, mild tenderness in the lower abdomen and well-healed vertical infraumbilical incision  Pelvic: external genitalia normal, rectovaginal septum normal.  Vagina small amount thin white discharge.  Cervix normal appearing,  no lesions and no motion tenderness.  Uterus mobile, nontender, enlarged ~ 14-16 weeks with smooth contours.  Adnexae non-palpable, nontender bilaterally.  Microscopic wet-mount exam shows negative for pathogens, normal epithelial cells.  Extremities: extremities normal, atraumatic, no cyanosis or edema     Records from recent hospitalization reviewed, currently scanned into EMR, including CT  abdomen/pelvis with enlarged fibroid uterus.   Assessment:   Symptomatic uterine fibroids.  Pelvic pain H/o cardiac disease, recently s/p TIA and cardiac catheterization  Plan:   1. Uterine fibroids - patient currently with newly diagnosed fibroids, likely cause of her ongoing pelvic pain.  CT scan did not delineate size or number of fibroids. Will order pelvic ultrasound to further characterize the fibroids.  Discussed options based on suspected size of the uterus, including hysterectomy and uterine fibroid embolization.  Due to patient's medical history and recent events leading to hospitalization, I would recommend waiting at least 3 months prior to embarking upon treatment. She would require treatment likely from a tertiary care facility.  2. Pelvic pain - Patient currently on Mobic but notes that it does not help with her pain.  Discussion with patient's daughter notes that patient has had issues with pain medication use in the past, including long-term use of Tramadol and overuse.  Has also had difficulties even with management with OTC meds like Tylenol as she will tend to overuse if she does not get immediate relief. Her daughter also notes that the Cardiologist did express concern with her use of Mobic with regards to her other medications (including her blood thinner). WIll prescribe Tylenol #3, and daughter states that she will help her mother control use of medication by keeping it with her and only allowing access to medications at approved dosing times.  3. H/o cardiac disease - patient is currently being followed by cardiology.   4. Patient to f/u in 1 week to discuss ultrasound results and further recommendations, as well as reassess her pain.    A total of 45 minutes were spent face-to-face with the patient during the encounter with greater than 50% dealing with counseling and coordination of care.   Rubie Maid, MD [facility]

## 2017-06-23 ENCOUNTER — Telehealth: Payer: Self-pay

## 2017-06-23 ENCOUNTER — Encounter: Payer: Self-pay | Admitting: Obstetrics and Gynecology

## 2017-06-23 DIAGNOSIS — I208 Other forms of angina pectoris: Secondary | ICD-10-CM

## 2017-06-23 DIAGNOSIS — Z951 Presence of aortocoronary bypass graft: Secondary | ICD-10-CM

## 2017-06-23 DIAGNOSIS — I25118 Atherosclerotic heart disease of native coronary artery with other forms of angina pectoris: Secondary | ICD-10-CM | POA: Diagnosis not present

## 2017-06-23 DIAGNOSIS — Z9861 Coronary angioplasty status: Secondary | ICD-10-CM

## 2017-06-23 NOTE — Telephone Encounter (Signed)
[facility] called wanting to confirm that we received request for medical records. Please check on this and call them back with an update.   [phone]

## 2017-06-23 NOTE — Progress Notes (Signed)
Daily Session Note  Patient Details  Name: Candace [name] MRN: [id number] Date of Birth: 04/28/1950 Referring Provider:     Cardiac Rehab from 06/02/2017 in [facility] Cardiac and Pulmonary Rehab  Referring Provider  Gilbert      Encounter Date: 06/23/2017  Check In: Session Check In - 06/23/17 0836      Check-In   Location  [facility]-Cardiac & Pulmonary Rehab    Staff Present  Jessica Hawkins, MA, RCEP, CCRP, Exercise Physiologist; , BA, ACSM CEP, Exercise Physiologist;Carroll Enterkin, RN, BSN    Supervising physician immediately available to respond to emergencies  See telemetry face sheet for immediately available ER MD    Medication changes reported      No    Fall or balance concerns reported     No    Warm-up and Cool-down  Performed on first and last piece of equipment    Resistance Training Performed  Yes    VAD Patient?  No      Pain Assessment   Currently in Pain?  No/denies    Multiple Pain Sites  No          Social History   Tobacco Use  Smoking Status Former Smoker  . Packs/day: 0.50  . Types: Cigarettes  . Last attempt to quit: 10/07/2001  . Years since quitting: 15.7  Smokeless Tobacco Never Used    Goals Met:  Independence with exercise equipment Exercise tolerated well No report of cardiac concerns or symptoms Strength training completed today  Goals Unmet:  Not Applicable  Comments: Pt able to follow exercise prescription today without complaint.  Will continue to monitor for progression.    Dr. Mark Miller is Medical Director for [facility] and [facility]. 

## 2017-06-28 ENCOUNTER — Encounter: Payer: Medicare Other | Attending: Internal Medicine | Admitting: *Deleted

## 2017-06-28 DIAGNOSIS — Z8673 Personal history of transient ischemic attack (TIA), and cerebral infarction without residual deficits: Secondary | ICD-10-CM | POA: Insufficient documentation

## 2017-06-28 DIAGNOSIS — F329 Major depressive disorder, single episode, unspecified: Secondary | ICD-10-CM | POA: Insufficient documentation

## 2017-06-28 DIAGNOSIS — I208 Other forms of angina pectoris: Secondary | ICD-10-CM | POA: Diagnosis present

## 2017-06-28 DIAGNOSIS — Z791 Long term (current) use of non-steroidal anti-inflammatories (NSAID): Secondary | ICD-10-CM | POA: Diagnosis not present

## 2017-06-28 DIAGNOSIS — I252 Old myocardial infarction: Secondary | ICD-10-CM | POA: Diagnosis not present

## 2017-06-28 DIAGNOSIS — Z9861 Coronary angioplasty status: Secondary | ICD-10-CM | POA: Insufficient documentation

## 2017-06-28 DIAGNOSIS — K219 Gastro-esophageal reflux disease without esophagitis: Secondary | ICD-10-CM | POA: Insufficient documentation

## 2017-06-28 DIAGNOSIS — Z87891 Personal history of nicotine dependence: Secondary | ICD-10-CM | POA: Diagnosis not present

## 2017-06-28 DIAGNOSIS — I25118 Atherosclerotic heart disease of native coronary artery with other forms of angina pectoris: Secondary | ICD-10-CM | POA: Insufficient documentation

## 2017-06-28 DIAGNOSIS — Z7982 Long term (current) use of aspirin: Secondary | ICD-10-CM | POA: Insufficient documentation

## 2017-06-28 DIAGNOSIS — Z79899 Other long term (current) drug therapy: Secondary | ICD-10-CM | POA: Diagnosis not present

## 2017-06-28 DIAGNOSIS — F419 Anxiety disorder, unspecified: Secondary | ICD-10-CM | POA: Insufficient documentation

## 2017-06-28 DIAGNOSIS — E119 Type 2 diabetes mellitus without complications: Secondary | ICD-10-CM | POA: Diagnosis not present

## 2017-06-28 DIAGNOSIS — E785 Hyperlipidemia, unspecified: Secondary | ICD-10-CM | POA: Diagnosis not present

## 2017-06-28 DIAGNOSIS — I10 Essential (primary) hypertension: Secondary | ICD-10-CM | POA: Diagnosis not present

## 2017-06-28 DIAGNOSIS — Z7984 Long term (current) use of oral hypoglycemic drugs: Secondary | ICD-10-CM | POA: Insufficient documentation

## 2017-06-28 LAB — GLUCOSE, CAPILLARY
Glucose-Capillary: 208 mg/dL — ABNORMAL HIGH (ref 65–99)
Glucose-Capillary: 144 mg/dL — ABNORMAL HIGH (ref 65–99)

## 2017-06-28 NOTE — Progress Notes (Signed)
Daily Session Note  Patient Details  Name: YEIMY BRABANT MRN: [id number] Date of Birth: [birth year] Referring Provider:     Cardiac Rehab from 06/02/2017 in [facility] Cardiac and Pulmonary Rehab  Referring Provider  Rosanna Randy      Encounter Date: 06/28/2017  Check In: Session Check In - 06/28/17 0825      Check-In   Location  [facility]-Cardiac & Pulmonary Rehab    Staff Present  Heath Lark, RN, BSN, CCRP;Shila Kruczek [place], MA, RCEP, CCRP, Exercise Physiologist;Amanda Oletta Darter, [place], ACSM CEP, Exercise Physiologist    Supervising physician immediately available to respond to emergencies  See telemetry face sheet for immediately available ER MD    Medication changes reported      No    Fall or balance concerns reported     No    Warm-up and Cool-down  Performed on first and last piece of equipment    Resistance Training Performed  Yes    VAD Patient?  No      Pain Assessment   Currently in Pain?  No/denies          Social History   Tobacco Use  Smoking Status Former Smoker  . Packs/day: 0.50  . Types: Cigarettes  . Last attempt to quit: 10/07/2001  . Years since quitting: 15.7  Smokeless Tobacco Never Used    Goals Met:  Independence with exercise equipment Exercise tolerated well No report of cardiac concerns or symptoms Strength training completed today  Goals Unmet:  Not Applicable  Comments: Pt able to follow exercise prescription today without complaint.  Will continue to monitor for progression.    Dr. Emily Filbert is Medical Director for [place] and [facility].

## 2017-06-29 ENCOUNTER — Telehealth: Payer: Self-pay

## 2017-06-29 ENCOUNTER — Encounter: Payer: Self-pay | Admitting: Obstetrics and Gynecology

## 2017-06-29 ENCOUNTER — Ambulatory Visit (INDEPENDENT_AMBULATORY_CARE_PROVIDER_SITE_OTHER): Payer: Medicare Other | Admitting: Obstetrics and Gynecology

## 2017-06-29 VITALS — BP 105/70 | HR 70 | Ht 62.0 in | Wt 172.9 lb

## 2017-06-29 DIAGNOSIS — R102 Pelvic and perineal pain: Secondary | ICD-10-CM | POA: Diagnosis not present

## 2017-06-29 DIAGNOSIS — D259 Leiomyoma of uterus, unspecified: Secondary | ICD-10-CM

## 2017-06-29 DIAGNOSIS — I519 Heart disease, unspecified: Secondary | ICD-10-CM | POA: Diagnosis not present

## 2017-06-29 NOTE — Telephone Encounter (Signed)
Patient states that he was returning your call. KW

## 2017-06-29 NOTE — Progress Notes (Signed)
Pt is doing well.

## 2017-06-30 ENCOUNTER — Encounter: Payer: Medicare Other | Admitting: *Deleted

## 2017-06-30 DIAGNOSIS — I25118 Atherosclerotic heart disease of native coronary artery with other forms of angina pectoris: Secondary | ICD-10-CM | POA: Diagnosis not present

## 2017-06-30 DIAGNOSIS — I208 Other forms of angina pectoris: Secondary | ICD-10-CM

## 2017-06-30 IMAGING — CR DG CHEST 1V PORT
1 series · 1 of 1 positions shown · non-contrast
Comparison: 11/10/2015.

CLINICAL DATA: CABG.

EXAM:
PORTABLE CHEST 1 VIEW

[AP]
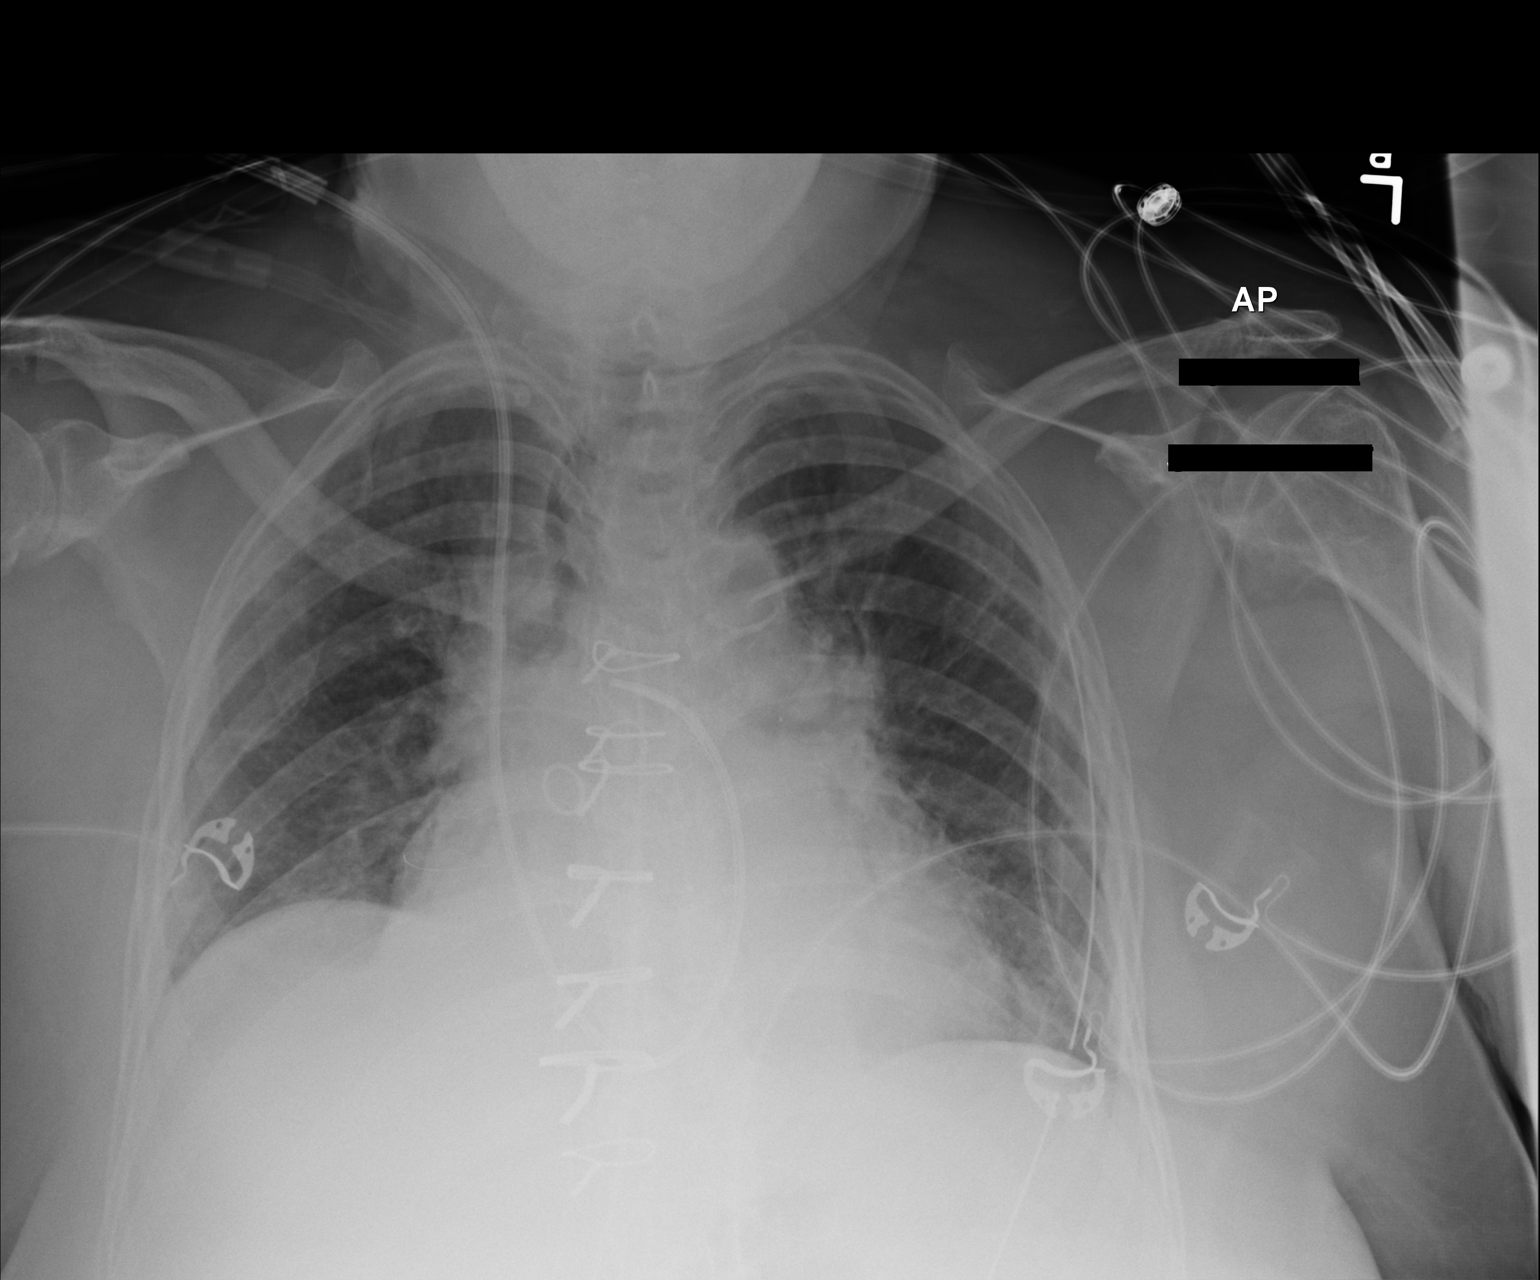

[1 of 1 positions shown; findings below may reference images not displayed]

FINDINGS: Interim extubation removal of NG tube. Swan-Ganz catheter left chest
tube in stable position. Prior CABG. Cardiomegaly with normal
pulmonary vascularity. Low lung volumes with mild bibasilar
atelectasis. No pleural effusion or pneumothorax .
IMPRESSION: 1. Interim extubation and removal of NG tube.Swan-Ganz catheter and
left chest tube in stable position. No pneumothorax.
2.   Prior CABG.  Stable cardiomegaly.
3.   Low lung volumes with mild bibasilar atelectasis .

## 2017-06-30 NOTE — Progress Notes (Signed)
Daily Session Note  Patient Details  Name: Candace [name] MRN: [id number] Date of Birth: 12/21/1950 Referring Provider:     Cardiac Rehab from 06/02/2017 in [facility]  Referring Provider  Gilbert      Encounter Date: 06/30/2017  Check In: Session Check In - 06/30/17 0826      Check-In   Location  [facility]    Staff Present   , MA, RCEP, CCRP, Exercise Physiologist;Amanda Sommer, BA, ACSM CEP, Exercise Physiologist;Carroll Enterkin, RN, BSN    Supervising physician immediately available to respond to emergencies  See telemetry face sheet for immediately available ER MD    Medication changes reported      No    Fall or balance concerns reported     No    Warm-up and Cool-down  Performed on first and last piece of equipment    Resistance Training Performed  Yes    VAD Patient?  No      Pain Assessment   Currently in Pain?  No/denies        Exercise Prescription Changes - 06/29/17 1400      Response to Exercise   Blood Pressure (Admit)  114/64    Blood Pressure (Exercise)  164/62    Blood Pressure (Exit)  128/70    Heart Rate (Admit)  81 bpm    Heart Rate (Exercise)  117 bpm    Heart Rate (Exit)  88 bpm    Rating of Perceived Exertion (Exercise)  13    Symptoms  none    Duration  Continue with 30 min of aerobic exercise without signs/symptoms of physical distress.    Intensity  THRR unchanged      Progression   Progression  Continue to progress workloads to maintain intensity without signs/symptoms of physical distress.    Average METs  2.69      Resistance Training   Training Prescription  Yes    Weight  3 lbs    Reps  10-15      Interval Training   Interval Training  No      NuStep   Level  3    Minutes  15    METs  3      Track   Laps  30    Minutes  15    METs  2.38      Home Exercise Plan   Plans to continue exercise at  Home (comment) walk and dance    Frequency  Add 3 additional days to  program exercise sessions.    Initial Home Exercises Provided  06/16/17       Social History   Tobacco Use  Smoking Status Former Smoker  . Packs/day: 0.50  . Types: Cigarettes  . Last attempt to quit: 10/07/2001  . Years since quitting: 15.7  Smokeless Tobacco Never Used    Goals Met:  Independence with exercise equipment Exercise tolerated well No report of cardiac concerns or symptoms Strength training completed today  Goals Unmet:  Not Applicable  Comments: Pt able to follow exercise prescription today without complaint.  Will continue to monitor for progression.    Dr. Mark Miller is Medical Director for [facility] and [facility]. 

## 2017-07-01 IMAGING — CR DG CHEST 1V PORT
1 series · 1 of 1 positions shown · non-contrast
Comparison: 11/11/2015.

CLINICAL DATA: Sore chest.

EXAM:
PORTABLE CHEST 1 VIEW

[AP]
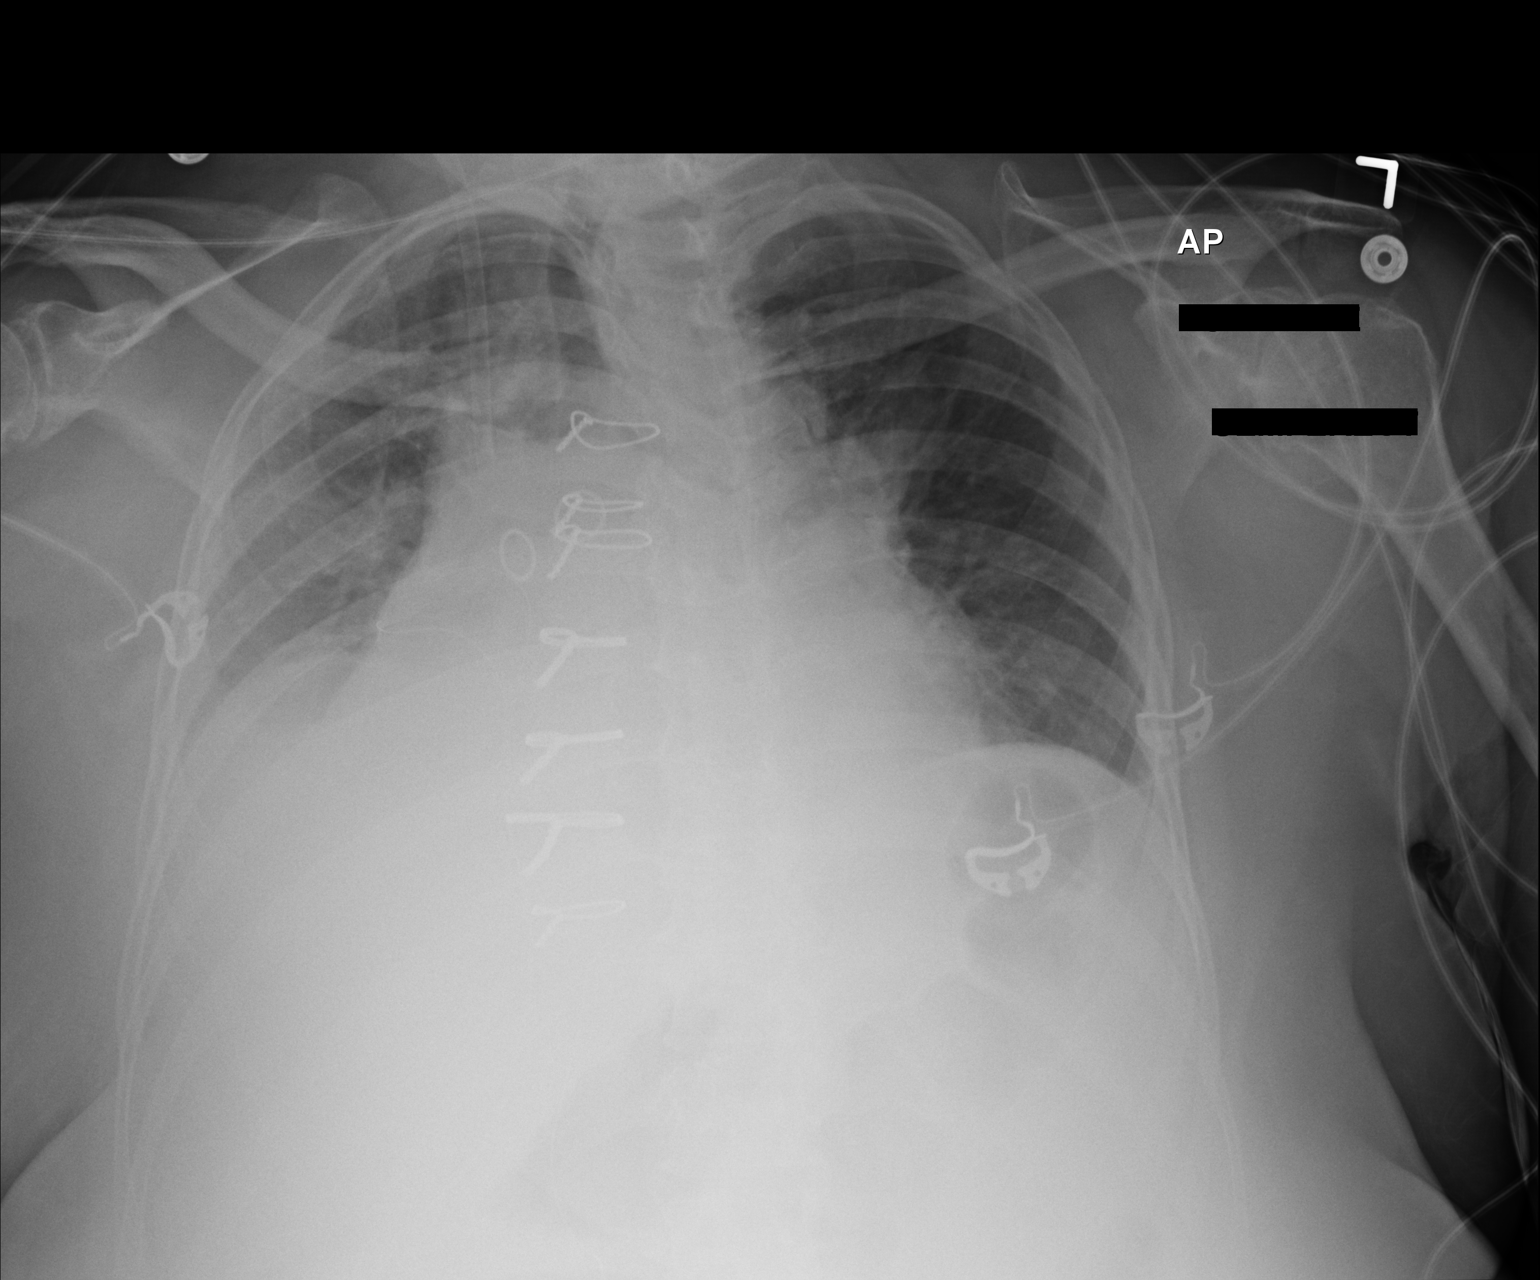

[1 of 1 positions shown; findings below may reference images not displayed]

FINDINGS: Interim removal Swan-Ganz catheter mediastinal drainage catheter
right IJ sheath in stable position. Prior CABG. Cardiomegaly. Right
lower lobe atelectasis. Mild component of interstitial edema appears
be present. Small right pleural effusion. No pneumothorax.
IMPRESSION: 1. Interim removal Swan-Ganz catheter and mediastinal drainage
catheter.

2.  Right lower lobe atelectasis.

3. Prior CABG. Cardiomegaly. Mild pulmonary interstitial edema
appears be present. Small right pleural effusion.

## 2017-07-02 ENCOUNTER — Encounter: Payer: Self-pay | Admitting: Obstetrics and Gynecology

## 2017-07-02 IMAGING — CR DG CHEST 2V
2 series · 2 of 2 positions shown · non-contrast
Comparison: 11/12/2015.

CLINICAL DATA: Open heart surgery.  Shortness of breath .

EXAM:
CHEST  2 VIEW

[chest lat]
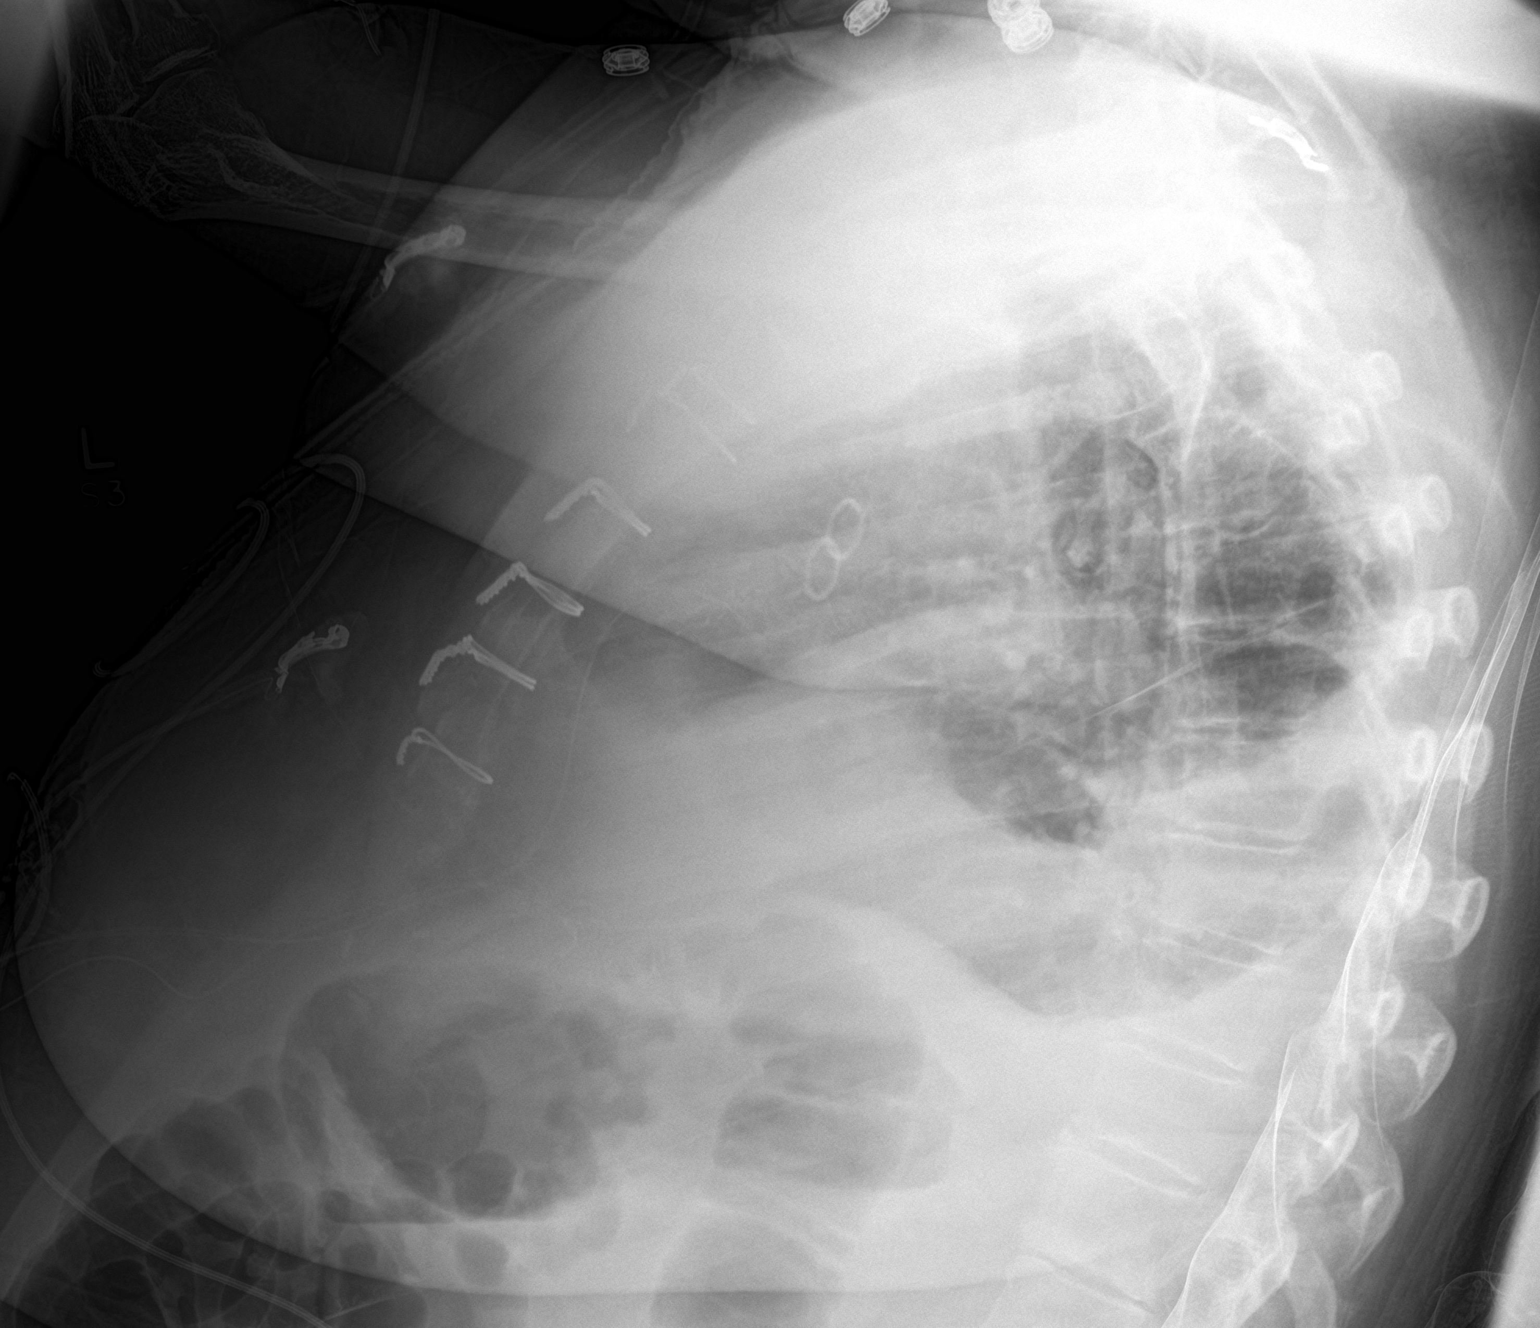

[chest ap]
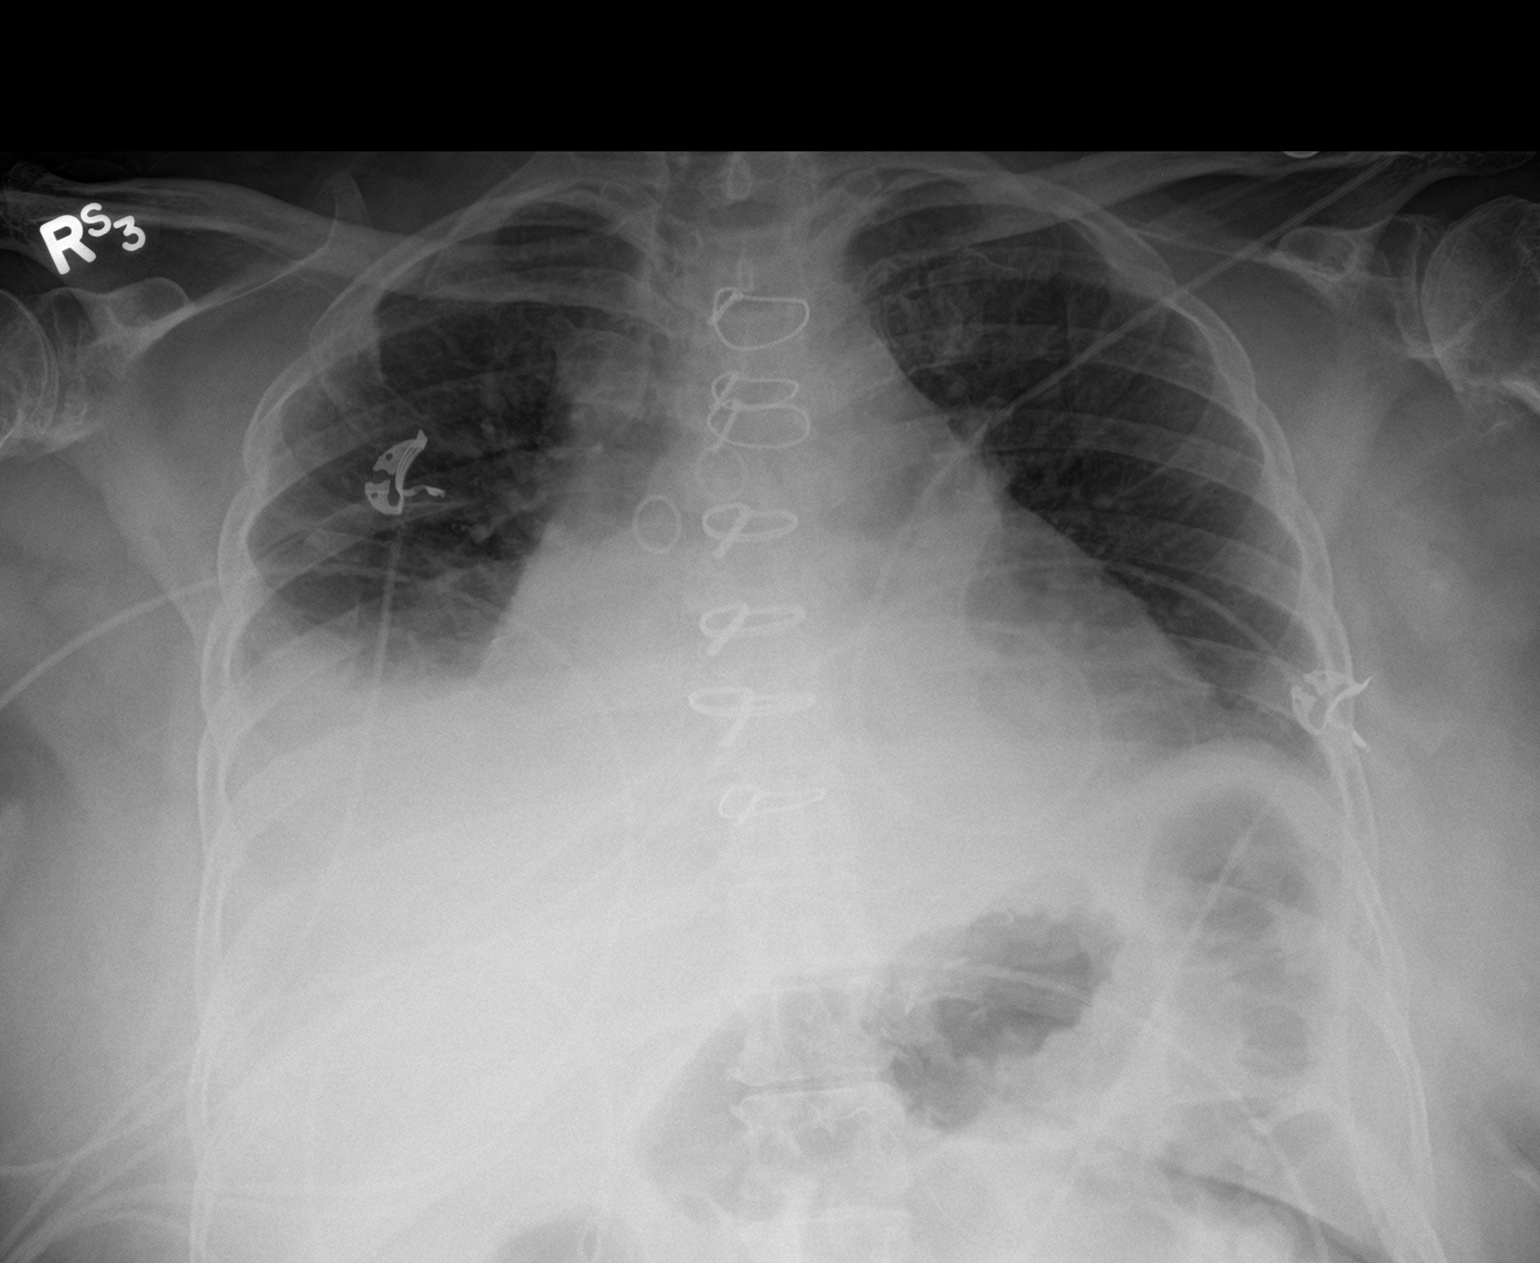

[2 of 2 positions shown; findings below may reference images not displayed]

FINDINGS: Interim removal of right IJ sheath. Prior CABG. Cardiomegaly.
Partial clearing of bilateral pulmonary interstitial edema. Low lung
volumes with persistent bibasilar atelectasis and/or
infiltrates/edema. Small bilateral pleural effusions.
IMPRESSION: 1.  Removal of right IJ sheath.

2.Prior CABG. Stable cardiomegaly. Partial clearing of bilateral
mild pulmonary interstitial edema. Low lung volumes with persistent
bibasilar atelectasis and/or infiltrates/edema. Small bilateral
pleural effusions.

## 2017-07-02 NOTE — Progress Notes (Signed)
GYNECOLOGY PROGRESS NOTE  Subjective:    Patient ID: Candace [name], female    DOB: 1951-01-22, 67 y.o.   MRN: [id number]  HPI  Patient is a 67 y.o. [id number] postmenopausal female with a h/o cardiac diseases recently s/p TIA and cardiac catheterization who presents for f/u after ultrasound, and further discussion on management of fibroid uterus and pelvic pain.  She notes the the Tylenol #3 helps with her headaches as well as her pelvic pain, however once it wears off her headaches due return.   The following portions of the patient's history were reviewed and updated as appropriate: allergies, current medications, past family history, past medical history, past social history, past surgical history and problem list.  Review of Systems Pertinent items noted in HPI and remainder of comprehensive ROS otherwise negative.   Objective:   Blood pressure 105/70, pulse 70, height 5\' 2"  (1.575 m), weight 172 lb 14.4 oz (78.4 kg). General appearance: alert and no distress Exam deferred   Imaging:  US Transvaginal Non-OB ULTRASOUND REPORT  Location: [facility] Date of Service:  06/22/2017  Indications: Fibroids Findings:  The uterus measures 11.0 x 8.2 x 7.8 cm. Echo texture is heterogeneous with evidence of focal masses. Within the uterus are multiple suspected fibroids measuring: Fibroid 1: 7.0 x 7.6 x 6.3 cm, Appears to be filling entire uterus. The Endometrium is unable to be delineated due to the presence of fibroid.  Neither ovary is visualized due to the presence of fibroid. Survey of the adnexa demonstrates no adnexal masses. There is no free fluid in the cul de sac.  Impression: 1. Anteverted fibroid uterus appears enlarged at 11.0 x 8.2 x 7.8 cm. 2. Large fibroid measuring 7.0 x 7.6 x 6.3 cm appears to be filling entire  uterus.  Recommendations: 1.Clinical correlation with the patient's History and Physical Exam.  Dario Ave, RDMS  The ultrasound images  and findings were reviewed by me and I agree with  the above report.  Finis Bud, M.D. 06/22/2017 2:07 PM   Assessment:   Enlarged fibroid uterus Pelvic pain Cardiac disease  Plan:    1. Fibroid uterus - Discussed options based on suspected size of the uterus, including hysterectomy and uterine fibroid embolization. After discussion of results of ultrasound and going into more detail with each of the procedures including risks and benefits, patient now desires definitive therapy with hysterectomy.  Due to patient's medical history and recent events leading to hospitalization, I would recommend waiting at least 3 months prior to embarking upon treatment.  Discussed having procedure performed at a tertiary center, however patient notes that she does not desire to travel that far to have a surgery.  Will get clearance from patient's medical doctors (PCP, Cardiology, Pulmonology) prior to proceeding with hysterectomy to ensure that patient can safely undergo procedure. All questions answered.  Patient has appointments with all doctors within the next 4 weeks.  Patient desires to have surgery at the end of May.  Will tentatively schedule for May 20th. Will need to undergo TAH/BSO due to size and location. Patient will need a pre-op appointment the week before.  2. Pelvic pain - can continue use of T#3 for now as it helps patient's symptoms. Her daughter is managing the administration of her pain medication.    A total of 25 minutes were spent face-to-face with the patient during this encounter and over half of that time involved counseling and coordination of care.    Marcelline Mates,  Dolphus Jenny, MD [facility]

## 2017-07-03 IMAGING — CR DG CHEST 1V PORT
1 series · 1 of 1 positions shown · non-contrast
Comparison: 11/05/2015 and 11/13/2015

CLINICAL DATA: Chest pain and weakness.  Wheezing for 2 days.

EXAM:
PORTABLE CHEST 1 VIEW

[AP]
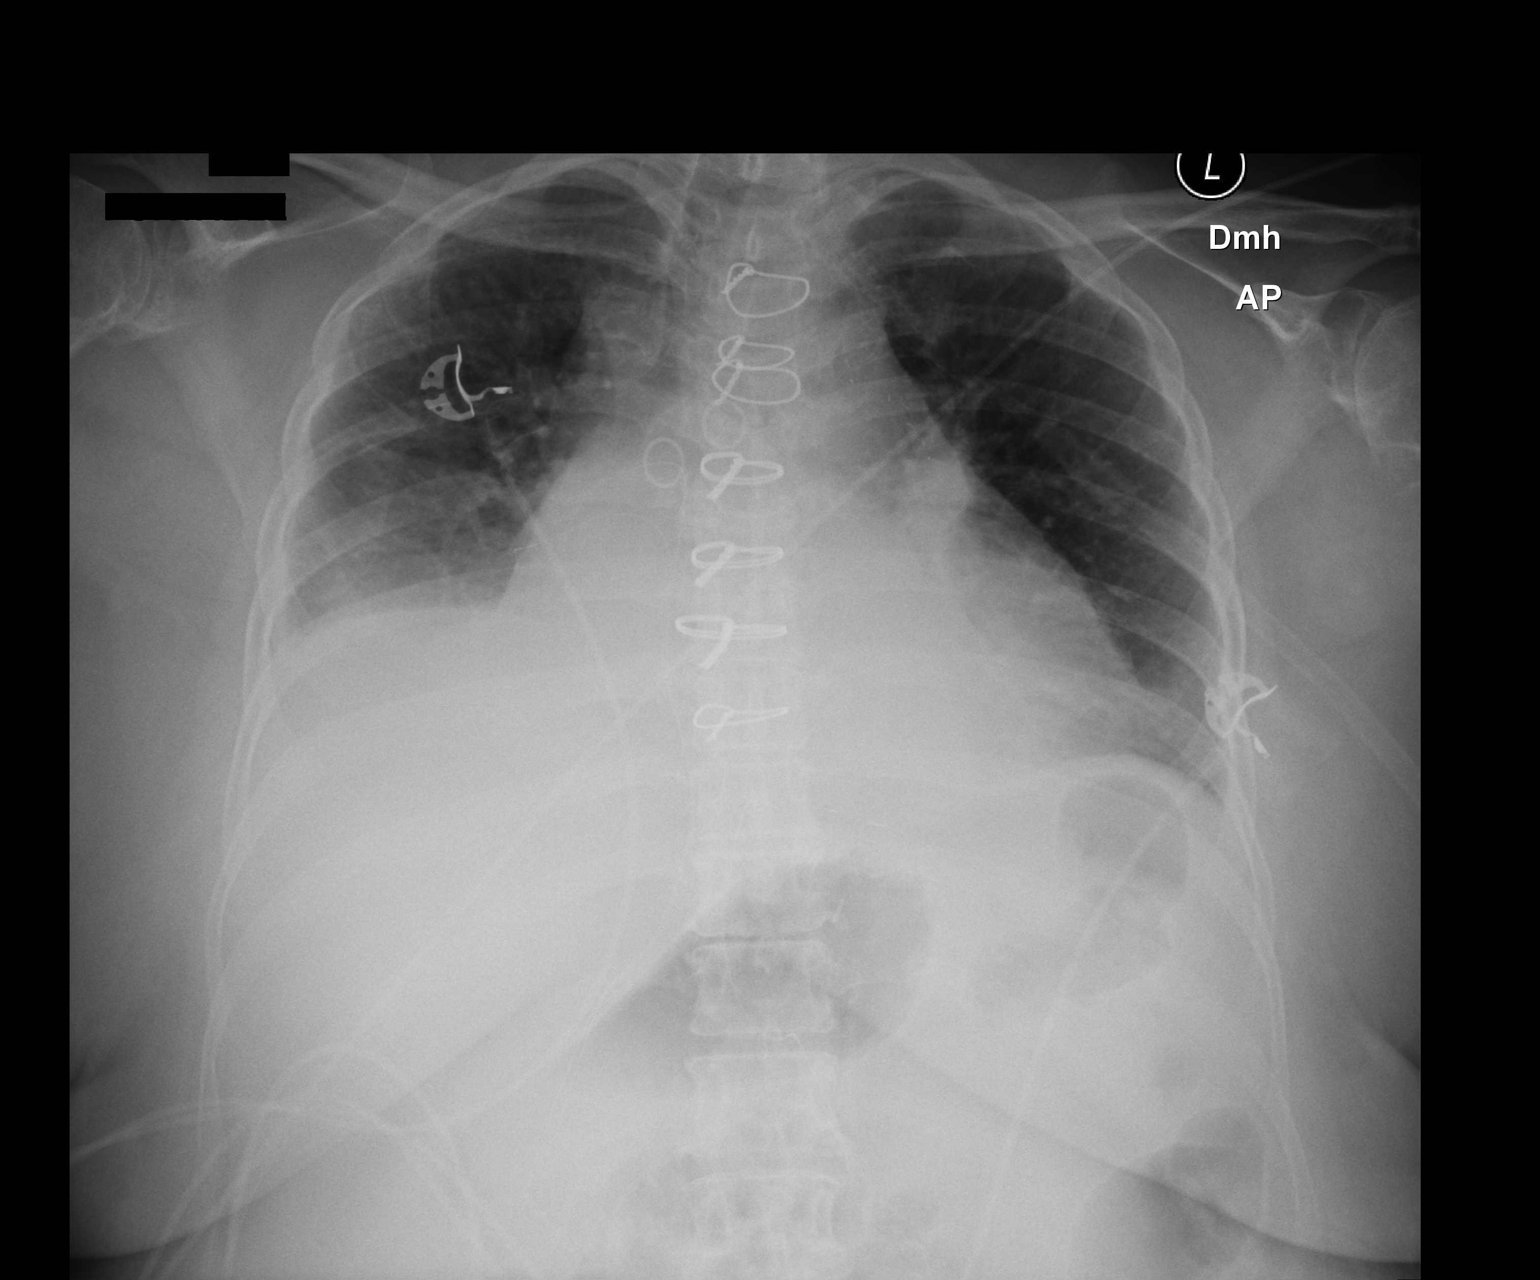

[1 of 1 positions shown; findings below may reference images not displayed]

FINDINGS: Low lung volumes with persistent densities at the right lung base
that are suggestive for pleural fluid and volume loss. Again noted
are post CABG changes in the chest. Cardiac silhouette remains
enlarged. No evidence for a large pneumothorax. Persistent densities
along the medial left lung base are most compatible with volume
loss.
IMPRESSION: Low lung volumes with persistent bibasilar atelectasis.

Evidence for right pleural fluid.

Stable cardiomegaly.

## 2017-07-05 DIAGNOSIS — I208 Other forms of angina pectoris: Secondary | ICD-10-CM

## 2017-07-05 DIAGNOSIS — Z951 Presence of aortocoronary bypass graft: Secondary | ICD-10-CM

## 2017-07-05 DIAGNOSIS — Z9861 Coronary angioplasty status: Secondary | ICD-10-CM

## 2017-07-05 DIAGNOSIS — I25118 Atherosclerotic heart disease of native coronary artery with other forms of angina pectoris: Secondary | ICD-10-CM | POA: Diagnosis not present

## 2017-07-05 NOTE — Progress Notes (Signed)
Daily Session Note  Patient Details  Name: Candace [name] MRN: [id number] Date of Birth: 29-Sep-1950 Referring Provider:     Cardiac Rehab from 06/02/2017 in [facility] Re[facility]  Referring Provider  Rosanna Randy      Encounter Date: 07/05/2017  Check In: Session Check In - 07/05/17 1898      Check-In   Location  [facility]    Staff Present  Alberteen Sam, MA, RCEP, CCRP, Exercise Physiologist;Lavina Resor Oletta Darter, BA, ACSM CEP, Exercise Physiologist;Susanne Bice, RN, BSN, CCRP    Supervising physician immediately available to respond to emergencies  See telemetry face sheet for immediately available ER MD    Medication changes reported      No    Fall or balance concerns reported     No    Warm-up and Cool-down  Performed on first and last piece of equipment    Resistance Training Performed  Yes    VAD Patient?  No      Pain Assessment   Currently in Pain?  No/denies    Multiple Pain Sites  No          Social History   Tobacco Use  Smoking Status Former Smoker  . Packs/day: 0.50  . Types: Cigarettes  . Last attempt to quit: 10/07/2001  . Years since quitting: 15.7  Smokeless Tobacco Never Used    Goals Met:  Independence with exercise equipment Exercise tolerated well No report of cardiac concerns or symptoms Strength training completed today  Goals Unmet:  Not Applicable  Comments: Pt able to follow exercise prescription today without complaint.  Will continue to monitor for progression.    Dr. Emily Filbert is Medical Director for [facility] and [facility].

## 2017-07-07 DIAGNOSIS — Z9861 Coronary angioplasty status: Secondary | ICD-10-CM

## 2017-07-07 DIAGNOSIS — I25118 Atherosclerotic heart disease of native coronary artery with other forms of angina pectoris: Secondary | ICD-10-CM | POA: Diagnosis not present

## 2017-07-07 DIAGNOSIS — I208 Other forms of angina pectoris: Secondary | ICD-10-CM

## 2017-07-07 DIAGNOSIS — I2089 Other forms of angina pectoris: Secondary | ICD-10-CM

## 2017-07-07 DIAGNOSIS — Z951 Presence of aortocoronary bypass graft: Secondary | ICD-10-CM

## 2017-07-07 NOTE — Progress Notes (Signed)
Daily Session Note  Patient Details  Name: MAYLANI EMBREE MRN: [id number] Date of Birth: 07-21-1950 Referring Provider:     Cardiac Rehab from 06/02/2017 in [facility] Re[facility]  Referring Provider  Rosanna Randy      Encounter Date: 07/07/2017  Check In: Session Check In - 07/07/17 0834      Check-In   Location  [facility]    Staff Present  Alberteen Sam, MA, RCEP, CCRP, Exercise Physiologist;Bonnell Placzek Oletta Darter, BA, ACSM CEP, Exercise Physiologist;Carroll Enterkin, RN, BSN    Supervising physician immediately available to respond to emergencies  See telemetry face sheet for immediately available ER MD    Medication changes reported      No    Fall or balance concerns reported     No    Warm-up and Cool-down  Performed on first and last piece of equipment    Resistance Training Performed  Yes    VAD Patient?  No      Pain Assessment   Currently in Pain?  No/denies    Multiple Pain Sites  No          Social History   Tobacco Use  Smoking Status Former Smoker  . Packs/day: 0.50  . Types: Cigarettes  . Last attempt to quit: 10/07/2001  . Years since quitting: 15.7  Smokeless Tobacco Never Used    Goals Met:  Independence with exercise equipment Exercise tolerated well No report of cardiac concerns or symptoms Strength training completed today  Goals Unmet:  Not Applicable  Comments: Pt able to follow exercise prescription today without complaint.  Will continue to monitor for progression.    Dr. Emily Filbert is Medical Director for [place] and [facility].

## 2017-07-12 ENCOUNTER — Encounter: Payer: Medicare Other | Admitting: *Deleted

## 2017-07-12 DIAGNOSIS — I208 Other forms of angina pectoris: Secondary | ICD-10-CM

## 2017-07-12 DIAGNOSIS — I25118 Atherosclerotic heart disease of native coronary artery with other forms of angina pectoris: Secondary | ICD-10-CM | POA: Diagnosis not present

## 2017-07-12 NOTE — Progress Notes (Signed)
Daily Session Note  Patient Details  Name: Candace [name] MRN: [id number] Date of Birth: [birth year] Referring Provider:     Cardiac Rehab from 06/02/2017 in [facility] Cardiac and Pulmonary Rehab  Referring Provider  Rosanna Randy      Encounter Date: 07/12/2017  Check In: Session Check In - 07/12/17 0935      Check-In   Location  [facility]    Staff Present  Heath Lark, RN, BSN, CCRP;Jessica Woodlawn, MA, RCEP, CCRP, Exercise Physiologist;Amanda Oletta Darter, [place], ACSM CEP, Exercise Physiologist    Supervising physician immediately available to respond to emergencies  See telemetry face sheet for immediately available ER MD    Medication changes reported      No    Fall or balance concerns reported     No    Warm-up and Cool-down  Performed on first and last piece of equipment    Resistance Training Performed  Yes    VAD Patient?  No      Pain Assessment   Currently in Pain?  No/denies          Social History   Tobacco Use  Smoking Status Former Smoker  . Packs/day: 0.50  . Types: Cigarettes  . Last attempt to quit: 10/07/2001  . Years since quitting: 15.7  Smokeless Tobacco Never Used    Goals Met:  Independence with exercise equipment Exercise tolerated well No report of cardiac concerns or symptoms Strength training completed today  Goals Unmet:  Not Applicable  Comments: Pt able to follow exercise prescription today without complaint.  Will continue to monitor for progression.    Dr. Emily Filbert is Medical Director for [facility] and [facility].

## 2017-07-13 ENCOUNTER — Ambulatory Visit (INDEPENDENT_AMBULATORY_CARE_PROVIDER_SITE_OTHER): Payer: Medicare Other | Admitting: Family Medicine

## 2017-07-13 VITALS — BP 122/60 | HR 84 | Temp 98.1°F | Resp 18 | Wt 172.0 lb

## 2017-07-13 DIAGNOSIS — G44229 Chronic tension-type headache, not intractable: Secondary | ICD-10-CM | POA: Diagnosis not present

## 2017-07-13 DIAGNOSIS — M7062 Trochanteric bursitis, left hip: Secondary | ICD-10-CM | POA: Diagnosis not present

## 2017-07-13 DIAGNOSIS — I25708 Atherosclerosis of coronary artery bypass graft(s), unspecified, with other forms of angina pectoris: Secondary | ICD-10-CM

## 2017-07-13 DIAGNOSIS — R5382 Chronic fatigue, unspecified: Secondary | ICD-10-CM

## 2017-07-13 DIAGNOSIS — G4733 Obstructive sleep apnea (adult) (pediatric): Secondary | ICD-10-CM

## 2017-07-13 MED ORDER — KETOROLAC TROMETHAMINE 30 MG/ML IJ SOLN
30.0000 mg | Freq: Once | INTRAMUSCULAR | Status: AC
  Administered 2017-07-13: 30 mg via INTRAMUSCULAR

## 2017-07-13 NOTE — Progress Notes (Signed)
Candace [name]  MRN: [id number] DOB: Feb 13, 1951  Subjective:  HPI   The patient is a 67 year old female who presents for follow up of chronic health.  She was last seen on 05/19/17 and at that time she had the following; Instructed to follow up with cardiology for her unstable angina.  'Referred to Gyn for uterine fibroids Instructed to follow up with pulmonology for lung nodules. Instructed to follow Dr Lonie Peak advise and discontinue Esgic. CAD/CABG-thought to be medically stable in patient mainly battling with her MDD/GAD Recent dehydration was resolved.   Depression/Anxiety being followed.  The patient has seen Gyn and has plans for hysterectomy at the end of May. Patient has been going to Cardiac rehab.  She is uncertain but thinks she may have twisted yesterday causing her to have left hip/back pain that radiates down her leg.   She has appointment May 6 for the nodules.   Patient Active Problem List   Diagnosis Date Noted  . B12 deficiency 12/01/2016  . Vitamin D deficiency 11/04/2016  . Depression with anxiety 09/30/2016  . PAF (paroxysmal atrial fibrillation) ([place]) 09/30/2016  . Chronic fatigue 09/30/2016  . Resting tremor 09/30/2016  . Nausea & vomiting 09/30/2016  . Mild obstructive sleep apnea 09/30/2016  . Headache 08/18/2016  . Abnormal stress test 08/05/2016  . Unstable angina ([place]) 08/03/2016  . Diabetes ([place]) 08/03/2016  . Snoring 02/13/2016  . Asthma 11/11/2015  . S/P CABG x 4 11/10/2015  . Coronary artery disease 11/07/2015  . Carotid artery narrowing 02/08/2014  . Chest pain 02/08/2014  . Mixed hyperlipidemia 02/08/2014  . Atypical chest pain 02/07/2014  . Essential (primary) hypertension 02/07/2014  . Awareness of heartbeats 02/07/2014  . Abdominal pain 10/05/2013  . D (diarrhea) 10/05/2013  . Acid reflux 10/05/2013    Past Medical History:  Diagnosis Date  . Allergy   . Anemia   . Anxiety   . Arrhythmia   . Arthritis   . Coronary artery  disease   . Depression   . Diabetes mellitus without complication ([place])   . Dyspnea    doe  . Dysrhythmia   . GERD (gastroesophageal reflux disease)   . Headache   . Hyperlipidemia   . Hypertension   . Myocardial infarction ([place])    2016  . Myocardial infarction with cardiac rehabilitation [facility])    MI 2016/ CABG 8/17    FINISHED CARDIAC REHAB 3 WEEKS AGO  . Panic attack   . Reflux   . Stroke ([place])   . Voice tremor     Social History   Socioeconomic History  . Marital status: Divorced    Spouse name: Not on file  . Number of children: Not on file  . Years of education: Not on file  . Highest education level: Not on file  Occupational History  . Not on file  Social Needs  . Financial resource strain: Not on file  . Food insecurity:    Worry: Not on file    Inability: Not on file  . Transportation needs:    Medical: Not on file    Non-medical: Not on file  Tobacco Use  . Smoking status: Former Smoker    Packs/day: 0.50    Types: Cigarettes    Last attempt to quit: 10/07/2001    Years since quitting: 15.7  . Smokeless tobacco: Never Used  Substance and Sexual Activity  . Alcohol use: No    Alcohol/week: 0.0 oz  . Drug use: No  .  Sexual activity: Not Currently  Lifestyle  . Physical activity:    Days per week: Not on file    Minutes per session: Not on file  . Stress: Not on file  Relationships  . Social connections:    Talks on phone: Not on file    Gets together: Not on file    Attends religious service: Not on file    Active member of club or organization: Not on file    Attends meetings of clubs or organizations: Not on file    Relationship status: Not on file  . Intimate partner violence:    Fear of current or ex partner: Not on file    Emotionally abused: Not on file    Physically abused: Not on file    Forced sexual activity: Not on file  Other Topics Concern  . Not on file  Social History Narrative  . Not on file    Outpatient Encounter  Medications as of 07/13/2017  Medication Sig Note  . acetaminophen-codeine (TYLENOL #3) 300-30 MG tablet Take 1-2 tablets by mouth every 6 (six) hours as needed for moderate pain.   Marland Kitchen aspirin 81 MG chewable tablet Chew 1 tablet (81 mg total) by mouth daily.   . clopidogrel (PLAVIX) 75 MG tablet Take 75 mg by mouth daily.   . Coenzyme Q10 (CO Q 10) 100 MG CAPS Take 100 mg by mouth daily.   . hydrochlorothiazide (MICROZIDE) 12.5 MG capsule Take 12.5 mg by mouth daily.   . isosorbide mononitrate (IMDUR) 30 MG 24 hr tablet Take 2 tablets (60 mg total) by mouth daily.   Marland Kitchen loperamide (IMODIUM) 2 MG capsule Take by mouth as needed for diarrhea or loose stools.   . metFORMIN (GLUCOPHAGE) 1000 MG tablet Take 1 tablet (1,000 mg total) by mouth 2 (two) times daily with a meal.   . metoprolol tartrate (LOPRESSOR) 25 MG tablet Take 25 mg by mouth 2 (two) times daily. 1 in the pm with a 50 mg in the am   . metoprolol tartrate (LOPRESSOR) 50 MG tablet TAKE 1 TABLET BY MOUTH TWICE A DAY FOR HIGH BLOOD PRESSURE (Patient taking differently: 1 in the am with a 25 mg in the pm)   . nitroGLYCERIN (NITROSTAT) 0.4 MG SL tablet Place 1 tablet (0.4 mg total) under the tongue every 5 (five) minutes as needed for chest pain.   Marland Kitchen ondansetron (ZOFRAN-ODT) 8 MG disintegrating tablet DISSOLVE 1 TABLET ON THE TONGUE EVERY 8 HOURS AS NEEDED FOR NAUSEA OR VOMITING   . rosuvastatin (CRESTOR) 10 MG tablet Take 1 tablet (10 mg total) by mouth daily.   Marland Kitchen tiZANidine (ZANAFLEX) 2 MG tablet Take 2 mg by mouth every 6 (six) hours as needed for muscle spasms (patient is to titrate up to 6 at bedtime). 05/19/2017: Patient has not started back on this yet  . diazepam (VALIUM) 2 MG tablet Take 1 tablet by mouth 2 (two) times daily as needed.    No facility-administered encounter medications on file as of 07/13/2017.     Allergies  Allergen Reactions  . Lisinopril     cough  . Penicillins Swelling, Rash and Other (See Comments)    Has  patient had a PCN reaction causing immediate rash, facial/tongue/throat swelling, SOB or lightheadedness with hypotension: Yes Has patient had a PCN reaction causing severe rash involving mucus membranes or skin necrosis: Yes Has patient had a PCN reaction that required hospitalization No Has patient had a PCN reaction occurring within the  last 10 years: Yes If all of the above answers are "NO", then may proceed with Cephalosporin use.     Review of Systems  Constitutional: Positive for malaise/fatigue. Negative for fever.  HENT: Negative.   Eyes: Negative.   Respiratory: Positive for shortness of breath (chronic).   Cardiovascular: Positive for chest pain (improving with cardiac rehab), palpitations and leg swelling.  Gastrointestinal: Negative.   Skin: Negative.   Endo/Heme/Allergies: Negative.   Psychiatric/Behavioral: Negative.     Objective:  BP 122/60 (BP Location: Right Arm, Patient Position: Sitting, Cuff Size: Normal)   Pulse 84   Temp 98.1 F (36.7 C) (Oral)   Resp 18   Wt 172 lb (78 kg)   BMI 31.46 kg/m   Physical Exam  Constitutional: She is oriented to person, place, and time and well-developed, well-nourished, and in no distress.  HENT:  Head: Normocephalic and atraumatic.  Eyes: Conjunctivae are normal.  Neck: No thyromegaly present.  Cardiovascular: Normal rate, regular rhythm and normal heart sounds.  Pulmonary/Chest: Effort normal and breath sounds normal.  Abdominal: Soft.  Musculoskeletal: She exhibits no edema.  Neurological: She is alert and oriented to person, place, and time. Gait normal. GCS score is 15.  Skin: Skin is warm and dry.  Psychiatric: Mood, memory, affect and judgment normal.    Assessment and Plan :  1. Trochanteric bursitis, left hip  - ketorolac (TORADOL) 30 MG/ML injection 30 mg 2.CAD/s/p CABG 3.Chronic Anxiety/depression 4.Chronic Headache Per Neurology Uterine Fibroids Hysterectomy scheduled.  I have done the exam and  reviewed the chart and it is accurate to the best of my knowledge. Development worker, community has been used and  any errors in dictation or transcription are unintentional. Miguel Aschoff M.D. [facility]

## 2017-07-14 ENCOUNTER — Encounter: Payer: Medicare Other | Admitting: *Deleted

## 2017-07-14 VITALS — BP 140/70 | HR 110

## 2017-07-14 DIAGNOSIS — I25118 Atherosclerotic heart disease of native coronary artery with other forms of angina pectoris: Secondary | ICD-10-CM | POA: Diagnosis not present

## 2017-07-14 DIAGNOSIS — I208 Other forms of angina pectoris: Secondary | ICD-10-CM

## 2017-07-14 NOTE — Progress Notes (Signed)
Daily Session Note  Patient Details  Name: Candace [name] MRN: [id number] Date of Birth: May 14, 1950 Referring Provider:     Cardiac Rehab from 06/02/2017 in [facility] Cardiac and Pulmonary Rehab  Referring Provider  Rosanna Randy      Encounter Date: 07/14/2017  Check In: Session Check In - 07/14/17 0909      Check-In   Location  [facility]    Staff Present  Alberteen Sam, MA, RCEP, CCRP, Exercise Physiologist;Amanda Oletta Darter, BA, ACSM CEP, Exercise Physiologist;Carroll Enterkin, RN, BSN    Supervising physician immediately available to respond to emergencies  See telemetry face sheet for immediately available ER MD    Medication changes reported      No    Fall or balance concerns reported     No    Warm-up and Cool-down  Performed on first and last piece of equipment    Resistance Training Performed  Yes    VAD Patient?  No      Pain Assessment   Currently in Pain?  No/denies          Social History   Tobacco Use  Smoking Status Former Smoker  . Packs/day: 0.50  . Types: Cigarettes  . Last attempt to quit: 10/07/2001  . Years since quitting: 15.7  Smokeless Tobacco Never Used    Goals Met:  Independence with exercise equipment Exercise tolerated well No report of cardiac concerns or symptoms Strength training completed today  Goals Unmet:  Not Applicable  Comments: Pt able to follow exercise prescription today without complaint.  Will continue to monitor for progression.    Dr. Emily Filbert is Medical Director for [place] and [facility].

## 2017-07-19 ENCOUNTER — Telehealth: Payer: Self-pay | Admitting: *Deleted

## 2017-07-19 NOTE — Telephone Encounter (Signed)
Candace [name] called to let us know that she would miss today as she is sick.

## 2017-07-20 ENCOUNTER — Encounter: Payer: Self-pay | Admitting: *Deleted

## 2017-07-20 DIAGNOSIS — Z9861 Coronary angioplasty status: Secondary | ICD-10-CM

## 2017-07-20 DIAGNOSIS — I208 Other forms of angina pectoris: Secondary | ICD-10-CM

## 2017-07-20 DIAGNOSIS — I2089 Other forms of angina pectoris: Secondary | ICD-10-CM

## 2017-07-20 NOTE — Progress Notes (Unsigned)
Cardiac Individual Treatment Plan  Patient Details  Name: Candace [name] MRN: [id number] Date of Birth: Apr 23, 1950 Referring Provider:     Cardiac Rehab from 06/02/2017 in [facility] Cardiac and Pulmonary Rehab  Referring Provider  Rosanna Randy      Initial Encounter Date:    Cardiac Rehab from 06/02/2017 in [facility] Cardiac and Pulmonary Rehab  Date  06/02/17  Referring Provider  Rosanna Randy      Visit Diagnosis: Chronic stable angina ([place])  S/P PTCA (percutaneous transluminal coronary angioplasty)  Patient's Home Medications on Admission:  Current Outpatient Medications:  .  acetaminophen-codeine (TYLENOL #3) 300-30 MG tablet, Take 1-2 tablets by mouth every 6 (six) hours as needed for moderate pain., Disp: 40 tablet, Rfl: 0 .  aspirin 81 MG chewable tablet, Chew 1 tablet (81 mg total) by mouth daily., Disp: 90 tablet, Rfl: 3 .  clopidogrel (PLAVIX) 75 MG tablet, Take 75 mg by mouth daily., Disp: , Rfl:  .  Coenzyme Q10 (CO Q 10) 100 MG CAPS, Take 100 mg by mouth daily., Disp: 30 capsule, Rfl: 12 .  diazepam (VALIUM) 2 MG tablet, Take 1 tablet by mouth 2 (two) times daily as needed., Disp: , Rfl:  .  hydrochlorothiazide (MICROZIDE) 12.5 MG capsule, Take 12.5 mg by mouth daily., Disp: , Rfl:  .  isosorbide mononitrate (IMDUR) 30 MG 24 hr tablet, Take 2 tablets (60 mg total) by mouth daily., Disp: 60 tablet, Rfl: 12 .  loperamide (IMODIUM) 2 MG capsule, Take by mouth as needed for diarrhea or loose stools., Disp: , Rfl:  .  metFORMIN (GLUCOPHAGE) 1000 MG tablet, Take 1 tablet (1,000 mg total) by mouth 2 (two) times daily with a meal., Disp: 180 tablet, Rfl: 3 .  metoprolol tartrate (LOPRESSOR) 25 MG tablet, Take 25 mg by mouth 2 (two) times daily. 1 in the pm with a 50 mg in the am, Disp: , Rfl:  .  metoprolol tartrate (LOPRESSOR) 50 MG tablet, TAKE 1 TABLET BY MOUTH TWICE A DAY FOR HIGH BLOOD PRESSURE (Patient taking differently: 1 in the am with a 25 mg in the pm), Disp: 60 tablet, Rfl: 12 .   nitroGLYCERIN (NITROSTAT) 0.4 MG SL tablet, Place 1 tablet (0.4 mg total) under the tongue every 5 (five) minutes as needed for chest pain., Disp: 30 tablet, Rfl: 0 .  ondansetron (ZOFRAN-ODT) 8 MG disintegrating tablet, DISSOLVE 1 TABLET ON THE TONGUE EVERY 8 HOURS AS NEEDED FOR NAUSEA OR VOMITING, Disp: 30 tablet, Rfl: 3 .  rosuvastatin (CRESTOR) 10 MG tablet, Take 1 tablet (10 mg total) by mouth daily., Disp: 90 tablet, Rfl: 3 .  tiZANidine (ZANAFLEX) 2 MG tablet, Take 2 mg by mouth every 6 (six) hours as needed for muscle spasms (patient is to titrate up to 6 at bedtime)., Disp: , Rfl:   Past Medical History: Past Medical History:  Diagnosis Date  . Allergy   . Anemia   . Anxiety   . Arrhythmia   . Arthritis   . Coronary artery disease   . Depression   . Diabetes mellitus without complication ([place])   . Dyspnea    doe  . Dysrhythmia   . GERD (gastroesophageal reflux disease)   . Headache   . Hyperlipidemia   . Hypertension   . Myocardial infarction ([place])    2016  . Myocardial infarction with cardiac rehabilitation [facility])    MI 2016/ CABG 8/17    FINISHED CARDIAC REHAB 3 WEEKS AGO  . Panic attack   . Reflux   .  Stroke ([place])   . Voice tremor     Tobacco Use: Social History   Tobacco Use  Smoking Status Former Smoker  . Packs/day: 0.50  . Types: Cigarettes  . Last attempt to quit: 10/07/2001  . Years since quitting: 15.7  Smokeless Tobacco Never Used    Labs: Recent Chemical engineer    Labs for ITP Cardiac and Pulmonary Rehab Latest Ref Rng & Units 11/11/2015 08/18/2016 10/01/2016 12/23/2016 03/24/2017   Cholestrol 100 - 199 mg/dL - - 259(H) - -   LDLCALC 0 - 99 mg/dL - - 173(H) - -   HDL >39 mg/dL - - 66 - -   Trlycerides 0 - 149 mg/dL - - 100 - -   Hemoglobin A1c <5.7 % of total Hgb - 8.1 6.9(H) 7.3(H) 7.8(H)   PHART 7.350 - 7.450 - - - - -   PCO2ART 35.0 - 45.0 mmHg - - - - -   HCO3 20.0 - 24.0 mEq/L - - - - -   TCO2 0 - 100 mmol/L 26 - - - -   O2SAT % - - -  - -       Exercise Target Goals:    Exercise Program Goal: Individual exercise prescription set using results from initial 6 min walk test and THRR while considering  patient's activity barriers and safety.   Exercise Prescription Goal: Initial exercise prescription builds to 30-45 minutes a day of aerobic activity, 2-3 days per week.  Home exercise guidelines will be given to patient during program as part of exercise prescription that the participant will acknowledge.  Activity Barriers & Risk Stratification: Activity Barriers & Cardiac Risk Stratification - 06/02/17 1340      Activity Barriers & Cardiac Risk Stratification   Activity Barriers  Back Problems;Deconditioning;Shortness of Breath;History of Falls;Chest Pain/Angina    Cardiac Risk Stratification  High       6 Minute Walk: 6 Minute Walk    Row Name 06/02/17 1427         6 Minute Walk   Distance  942 feet     Walk Time  6 minutes     # of Rest Breaks  0     MPH  1.78     METS  3.14     RPE  15     Perceived Dyspnea   2     VO2 Peak  10.99     Symptoms  Yes (comment)     Comments  legs fatigued     Resting HR  84 bpm     Resting BP  120/80     Resting Oxygen Saturation   96 %     Exercise Oxygen Saturation  during 6 min walk  99 %     Max Ex. HR  128 bpm     Max Ex. BP  192/104     2 Minute Post BP  148/74        Oxygen Initial Assessment:   Oxygen Re-Evaluation:   Oxygen Discharge (Final Oxygen Re-Evaluation):   Initial Exercise Prescription: Initial Exercise Prescription - 06/02/17 1400      Date of Initial Exercise RX and Referring Provider   Date  06/02/17    Referring Provider  Rosanna Randy      Recumbant Bike   Level  2    RPM  60    Watts  28    Minutes  15    METs  3      NuStep  Level  3    SPM  80    Minutes  15    METs  3      Track   Laps  45    Minutes  15    METs  3      Prescription Details   Frequency (times per week)  3    Duration  Progress to 30 minutes of  continuous aerobic without signs/symptoms of physical distress      Intensity   THRR 40-80% of Max Heartrate  112-140    Ratings of Perceived Exertion  11-13    Perceived Dyspnea  0-4      Resistance Training   Training Prescription  Yes    Weight  3 lb    Reps  10-15       Perform Capillary Blood Glucose checks as needed.  Exercise Prescription Changes: Exercise Prescription Changes    Row Name 06/02/17 1400 06/14/17 1500 06/16/17 0900 06/29/17 1400 07/12/17 1600     Response to Exercise   Blood Pressure (Admit)  120/80  106/64  -  114/64  108/60   Blood Pressure (Exercise)  192/104  140/80  -  164/62  135/64   Blood Pressure (Exit)  148/74  124/60  -  128/70  108/62   Heart Rate (Admit)  85 bpm  86 bpm  -  81 bpm  92 bpm   Heart Rate (Exercise)  128 bpm  135 bpm  -  117 bpm  136 bpm   Heart Rate (Exit)  96 bpm  105 bpm  -  88 bpm  88 bpm   Oxygen Saturation (Admit)  96 %  -  -  -  -   Oxygen Saturation (Exit)  99 %  -  -  -  -   Rating of Perceived Exertion (Exercise)  15  16  -  13  12   Symptoms  -  none  -  none  none   Comments  -  first full day of exercise  -  -  -   Duration  -  Continue with 30 min of aerobic exercise without signs/symptoms of physical distress.  -  Continue with 30 min of aerobic exercise without signs/symptoms of physical distress.  Continue with 30 min of aerobic exercise without signs/symptoms of physical distress.   Intensity  -  THRR unchanged  -  THRR unchanged  THRR unchanged     Progression   Progression  -  Continue to progress workloads to maintain intensity without signs/symptoms of physical distress.  -  Continue to progress workloads to maintain intensity without signs/symptoms of physical distress.  Continue to progress workloads to maintain intensity without signs/symptoms of physical distress.   Average METs  -  2.31  -  2.69  3.06     Resistance Training   Training Prescription  -  Yes  -  Yes  Yes   Weight  -  3 lb  -  3 lbs  3  lbs   Reps  -  10-15  -  10-15  10-15     Interval Training   Interval Training  -  No  -  No  No     Recumbant Bike   Level  -  -  -  -  3   Watts  -  -  -  -  26   Minutes  -  -  -  -  15  METs  -  -  -  -  2.98     NuStep   Level  -  3  -  3  3   Minutes  -  15  -  15  15   METs  -  2.7  -  3  3.3     Track   Laps  -  20  -  30  42   Minutes  -  15  -  15  15   METs  -  1.92  -  2.38  2.91     Home Exercise Plan   Plans to continue exercise at  -  -  Home (comment) walk and dance  Home (comment) walk and dance  Home (comment) walk and dance   Frequency  -  -  Add 3 additional days to program exercise sessions.  Add 3 additional days to program exercise sessions.  Add 3 additional days to program exercise sessions.   Initial Home Exercises Provided  -  -  06/16/17  06/16/17  06/16/17      Exercise Comments: Exercise Comments    Row Name 06/07/17 0947           Exercise Comments  Reviewed home exercise with pt today.  Pt plans to walk at home and stores for exercise.  Reviewed THR, pulse, RPE, sign and symptoms, and when to call 911 or MD.  Also discussed weather considerations and indoor options.  Pt voiced understanding.          Exercise Goals and Review: Exercise Goals    Row Name 06/02/17 1426             Exercise Goals   Increase Physical Activity  Yes       Intervention  Provide advice, education, support and counseling about physical activity/exercise needs.;Develop an individualized exercise prescription for aerobic and resistive training based on initial evaluation findings, risk stratification, comorbidities and participant's personal goals.       Expected Outcomes  Short Term: Attend rehab on a regular basis to increase amount of physical activity.;Long Term: Add in home exercise to make exercise part of routine and to increase amount of physical activity.;Long Term: Exercising regularly at least 3-5 days a week.       Increase Strength and Stamina   Yes       Intervention  Provide advice, education, support and counseling about physical activity/exercise needs.;Develop an individualized exercise prescription for aerobic and resistive training based on initial evaluation findings, risk stratification, comorbidities and participant's personal goals.       Expected Outcomes  Short Term: Increase workloads from initial exercise prescription for resistance, speed, and METs.;Short Term: Perform resistance training exercises routinely during rehab and add in resistance training at home;Long Term: Improve cardiorespiratory fitness, muscular endurance and strength as measured by increased METs and functional capacity (6MWT)       Able to understand and use rate of perceived exertion (RPE) scale  Yes       Intervention  Provide education and explanation on how to use RPE scale       Expected Outcomes  Short Term: Able to use RPE daily in rehab to express subjective intensity level;Long Term:  Able to use RPE to guide intensity level when exercising independently       Able to understand and use Dyspnea scale  Yes       Intervention  Provide education and explanation on how to use  Dyspnea scale       Expected Outcomes  Short Term: Able to use Dyspnea scale daily in rehab to express subjective sense of shortness of breath during exertion;Long Term: Able to use Dyspnea scale to guide intensity level when exercising independently       Knowledge and understanding of Target Heart Rate Range (THRR)  Yes       Intervention  Provide education and explanation of THRR including how the numbers were predicted and where they are located for reference       Expected Outcomes  Short Term: Able to state/look up THRR;Long Term: Able to use THRR to govern intensity when exercising independently;Short Term: Able to use daily as guideline for intensity in rehab       Able to check pulse independently  Yes       Intervention  Provide education and demonstration on how to check  pulse in carotid and radial arteries.;Review the importance of being able to check your own pulse for safety during independent exercise       Expected Outcomes  Short Term: Able to explain why pulse checking is important during independent exercise;Long Term: Able to check pulse independently and accurately       Understanding of Exercise Prescription  Yes       Intervention  Provide education, explanation, and written materials on patient's individual exercise prescription       Expected Outcomes  Short Term: Able to explain program exercise prescription;Long Term: Able to explain home exercise prescription to exercise independently          Exercise Goals Re-Evaluation : Exercise Goals Re-Evaluation    Row Name 06/07/17 0947 06/16/17 0930 06/29/17 1434 07/12/17 1603       Exercise Goal Re-Evaluation   Exercise Goals Review  Knowledge and understanding of Target Heart Rate Range (THRR);Able to understand and use rate of perceived exertion (RPE) scale;Understanding of Exercise Prescription  Understanding of Exercise Prescription;Increase Physical Activity;Increase Strength and Stamina;Knowledge and understanding of Target Heart Rate Range (THRR);Able to understand and use rate of perceived exertion (RPE) scale;Able to check pulse independently  Increase Physical Activity;Understanding of Exercise Prescription;Increase Strength and Stamina  Increase Physical Activity;Understanding of Exercise Prescription;Increase Strength and Stamina    Comments  Reviewed RPE scale, THR and program prescription with pt today.  Pt voiced understanding and was given a copy of goals to take home.   Reviewed home exercise with pt today.  Pt plans to walk and dance for exercise.  Reviewed THR, pulse, RPE, sign and symptoms, NTG use, and when to call 911 or MD.  Also discussed weather considerations and indoor options.  Pt voiced understanding.  Tawanna is doing well in rehab.  She is dancing around the track again serving as  entertainment to her classmates.  She is up to 30 laps.  We will continue to monitor her progression.   Kaarin has continued to do well in rehab.  She was dancing again today and doing the twist to the music.  She was able to get 42 laps today!!  She is also up to 3.3 METs on the NuStep.  We will continue to monitor her progress.     Expected Outcomes  Short: Use RPE daily to regulate intensity.  Long: Follow program prescription in THR  Short: Add in one day of walking and dancing at home.  Long: Follow home exercise prescription.   Short: Continue to try to walk and dance at home.  Long: Continue  to build up strength and stamina.   Short: Increase workload on recumbent bike.  Long: Continue to be more active at home.        Discharge Exercise Prescription (Final Exercise Prescription Changes): Exercise Prescription Changes - 07/12/17 1600      Response to Exercise   Blood Pressure (Admit)  108/60    Blood Pressure (Exercise)  135/64    Blood Pressure (Exit)  108/62    Heart Rate (Admit)  92 bpm    Heart Rate (Exercise)  136 bpm    Heart Rate (Exit)  88 bpm    Rating of Perceived Exertion (Exercise)  12    Symptoms  none    Duration  Continue with 30 min of aerobic exercise without signs/symptoms of physical distress.    Intensity  THRR unchanged      Progression   Progression  Continue to progress workloads to maintain intensity without signs/symptoms of physical distress.    Average METs  3.06      Resistance Training   Training Prescription  Yes    Weight  3 lbs    Reps  10-15      Interval Training   Interval Training  No      Recumbant Bike   Level  3    Watts  26    Minutes  15    METs  2.98      NuStep   Level  3    Minutes  15    METs  3.3      Track   Laps  42    Minutes  15    METs  2.91      Home Exercise Plan   Plans to continue exercise at  Home (comment) walk and dance    Frequency  Add 3 additional days to program exercise sessions.    Initial Home  Exercises Provided  06/16/17       Nutrition:  Target Goals: Understanding of nutrition guidelines, daily intake of sodium <1543m, cholesterol <2016m calories 30% from fat and 7% or less from saturated fats, daily to have 5 or more servings of fruits and vegetables.  Biometrics: Pre Biometrics - 06/02/17 1425      Pre Biometrics   Height  5' 2.75" (1.594 m)    Weight  173 lb 8 oz (78.7 kg)    Waist Circumference  38 inches    Hip Circumference  42.5 inches    Waist to Hip Ratio  0.89 %    BMI (Calculated)  30.97    Single Leg Stand  4.26 seconds        Nutrition Therapy Plan and Nutrition Goals: Nutrition Therapy & Goals - 06/09/17 0957      Nutrition Therapy   Diet  TLC/ DASH    Protein (specify units)  11oz    Fiber  25 grams    Whole Grain Foods  3 servings    Saturated Fats  14 max. grams    Fruits and Vegetables  4 servings/day 8 ideal    Sodium  1500 grams      Personal Nutrition Goals   Nutrition Goal  Consume sources of protein more regularly, ideally daily she agrees to drink more milk or cheese    Personal Goal #2  Consume Glucerna nutritonal drinks more regularly, use as a meal replacement or snack between meals eats 1-2 meals/day typically    Personal Goal #3  Eat foods that have a stong smell  such as soups or that have strong flavorings such as citrus or salt-free herbs/ spices    Comments  patient c/o decreased appetite and taste changes. she has made changes to her diet in the past year including eating more fruits and vegetables, decreasing her sodium intake, and eating less fried foods/ red meats      Intervention Plan   Intervention  Prescribe, educate and counsel regarding individualized specific dietary modifications aiming towards targeted core components such as weight, hypertension, lipid management, diabetes, heart failure and other comorbidities.    Expected Outcomes  Short Term Goal: Understand basic principles of dietary content, such as  calories, fat, sodium, cholesterol and nutrients.;Short Term Goal: A plan has been developed with personal nutrition goals set during dietitian appointment.;Long Term Goal: Adherence to prescribed nutrition plan.       Nutrition Assessments: Nutrition Assessments - 06/02/17 1345      MEDFICTS Scores   Pre Score  10       Nutrition Goals Re-Evaluation: Nutrition Goals Re-Evaluation    [place] Name 06/30/17 1023             Goals   Current Weight  172 lb (78 kg)       Nutrition Goal  Kasia has been trying to eat protein when she eats.          Personal Goal #2 Re-Evaluation   Personal Goal #2  [place] said she has not been as hungry and not eating as much since she needs a hysterectomy since she has a fibroid that needs to be removed and she feels it is pressing on her abd.          Nutrition Goals Discharge (Final Nutrition Goals Re-Evaluation): Nutrition Goals Re-Evaluation - 06/30/17 1023      Goals   Current Weight  172 lb (78 kg)    Nutrition Goal  [place] has been trying to eat protein when she eats.       Personal Goal #2 Re-Evaluation   Personal Goal #2  Bettey said she has not been as hungry and not eating as much since she needs a hysterectomy since she has a fibroid that needs to be removed and she feels it is pressing on her abd.       Psychosocial: Target Goals: Acknowledge presence or absence of significant depression and/or stress, maximize coping skills, provide positive support system. Participant is able to verbalize types and ability to use techniques and skills needed for reducing stress and depression.   Initial Review & Psychosocial Screening: Initial Psych Review & Screening - 06/02/17 1342      Initial Review   Current issues with  Current Stress Concerns    Source of Stress Concerns  Unable to participate in former interests or hobbies;Unable to perform yard/household activities      [place]?  Yes      Barriers   Psychosocial  barriers to participate in program  There are no identifiable barriers or psychosocial needs.;The patient should benefit from training in stress management and relaxation.      Screening Interventions   Interventions  Encouraged to exercise;Provide feedback about the scores to participant;To provide support and resources with identified psychosocial needs    Expected Outcomes  Short Term goal: Utilizing psychosocial counselor, staff and physician to assist with identification of specific Stressors or current issues interfering with healing process. Setting desired goal for each stressor or current issue identified.;Long Term Goal: Stressors or  current issues are controlled or eliminated.;Short Term goal: Identification and review with participant of any Quality of Life or Depression concerns found by scoring the questionnaire.;Long Term goal: The participant improves quality of Life and PHQ9 Scores as seen by post scores and/or verbalization of changes       Quality of Life Scores:  Quality of Life - 06/02/17 1204      Quality of Life Scores   Health/Function Pre  9.47 %    Socioeconomic Pre  18058 %    Psych/Spiritual Pre  18.86 %    Family Pre  21.9 %    GLOBAL Pre  15 %      Scores of 19 and below usually indicate a poorer quality of life in these areas.  A difference of  2-3 points is a clinically meaningful difference.  A difference of 2-3 points in the total score of the Quality of Life Index has been associated with significant improvement in overall quality of life, self-image, physical symptoms, and general health in studies assessing change in quality of life.  PHQ-9: Recent Review Flowsheet Data    Depression screen [facility] 2/9 06/02/2017 04/25/2017 11/04/2016 07/28/2016 03/16/2016   Decreased Interest 2 0 0 3 1   Down, Depressed, Hopeless 1 0 0 0 1   PHQ - 2 Score 3 0 0 3 2   Altered sleeping 3 3 0 0 0   Tired, decreased energy 2 3 0 3 1   Change in appetite 2 2 0 3 1   Feeling bad or  failure about yourself  1 0 0 0 0   Trouble concentrating 1 0 0 0 0   Moving slowly or fidgety/restless 2 0 0 0 0   Suicidal thoughts 0 2  - 0 0   PHQ-9 Score 14 10 0 9 4   Difficult doing work/chores Not difficult at all Somewhat difficult - - Not difficult at all     Interpretation of Total Score  Total Score Depression Severity:  1-4 = Minimal depression, 5-9 = Mild depression, 10-14 = Moderate depression, 15-19 = Moderately severe depression, 20-27 = Severe depression   Psychosocial Evaluation and Intervention: Psychosocial Evaluation - 06/30/17 0949      Psychosocial Evaluation & Interventions   Comments  Counselor met with Ms Dalporto [facility]) today for initial psychosocial evaluation.  She returned to this program after a year due to a recent heart attack.  Lourene has a strong support system living with her daughter; multiple sisters and brothers and other children and she has home health involved in transporting her to/from this program.  Avree has had multiple falls lately and is being seen by a neurologist for chronic headaches.  She also is not sleeping well (maybe 3-4 hours/night) and is seeing the "Sleep Dr" next month about following her on this.  Kandice states her appetite is not good and she has more anxiety lately since she can't drive or live independently while she is a fall risk.  This is her primary stress and her goal for this program is "to get my life back."  Staff will follow with [place] while in this program.    Expected [place] will see the sleep Dr. to help with her sleep concerns.  Long - Jamaya will exercise consistently to achieve her stated goal of feeling stronger and getting her "life back."         Psychosocial Re-Evaluation: Psychosocial Re-Evaluation    Row Name 06/30/17  1027             Psychosocial Re-Evaluation   Comments  Chasmine is still unable to drive but she said when she sees her MD in Sept she hopes he will let her drive. Marinda reports that she is  having problems sleeping still due to her chronic headaches. Randie said accupuncture for her headaches she had to stop since it hurt too much. Aprile said her neurologist did not want her to go to a chiropracter for her headaches. She has been on medicine for her headaches for several months but  Dawnya reports she still has headaches.        Interventions  Encouraged to attend Cardiac Rehabilitation for the exercise          Psychosocial Discharge (Final Psychosocial Re-Evaluation): Psychosocial Re-Evaluation - 06/30/17 1027      Psychosocial Re-Evaluation   Comments  Oma is still unable to drive but she said when she sees her MD in Sept she hopes he will let her drive. Anetra reports that she is having problems sleeping still due to her chronic headaches. Rashae said accupuncture for her headaches she had to stop since it hurt too much. Nakema said her neurologist did not want her to go to a chiropracter for her headaches. She has been on medicine for her headaches for several months but  Leeona reports she still has headaches.     Interventions  Encouraged to attend Cardiac Rehabilitation for the exercise       Vocational Rehabilitation: Provide vocational rehab assistance to qualifying candidates.   Vocational Rehab Evaluation & Intervention: Vocational Rehab - 06/02/17 1206      Initial Vocational Rehab Evaluation & Intervention   Assessment shows need for Vocational Rehabilitation  No       Education: Education Goals: Education classes will be provided on a variety of topics geared toward better understanding of heart health and risk factor modification. Participant will state understanding/return demonstration of topics presented as noted by education test scores.  Learning Barriers/Preferences: Learning Barriers/Preferences - 06/02/17 1343      Learning Barriers/Preferences   Learning Barriers  Exercise Concerns Exertion causes SOB.   Chronic headache from back problems    Learning Preferences   None       Education Topics:  AED/CPR: - Group verbal and written instruction with the use of models to demonstrate the basic use of the AED with the basic ABC's of resuscitation.   Cardiac Rehab from 07/14/2017 in [facility] Cardiac and Pulmonary Rehab  Date  06/30/17  Educator  CE  Instruction Review Code  1- Verbalizes Understanding      General Nutrition Guidelines/Fats and Fiber: -Group instruction provided by verbal, written material, models and posters to present the general guidelines for heart healthy nutrition. Gives an explanation and review of dietary fats and fiber.   Cardiac Rehab from 04/08/2016 in [facility] Cardiac and Pulmonary Rehab  Date  04/06/16  Educator  PI  Instruction Review Code (retired)  2- meets goals/outcomes      Controlling Sodium/Reading Food Labels: -Group verbal and written material supporting the discussion of sodium use in heart healthy nutrition. Review and explanation with models, verbal and written materials for utilization of the food label.   Cardiac Rehab from 07/14/2017 in [facility] Cardiac and Pulmonary Rehab  Date  06/28/17  Educator  PI  Instruction Review Code  1- Verbalizes Understanding      Exercise Physiology & General Exercise Guidelines: - Group verbal and  written instruction with models to review the exercise physiology of the cardiovascular system and associated critical values. Provides general exercise guidelines with specific guidelines to those with heart or lung disease.    Cardiac Rehab from 07/14/2017 in [facility] Cardiac and Pulmonary Rehab  Date  07/07/17  Educator  [facility]  Instruction Review Code  1- Verbalizes Understanding      Aerobic Exercise & Resistance Training: - Gives group verbal and written instruction on the various components of exercise. Focuses on aerobic and resistive training programs and the benefits of this training and how to safely progress through these programs..   Cardiac Rehab from 07/14/2017 in [facility] Cardiac and  Pulmonary Rehab  Date  07/12/17  Educator  AS  Instruction Review Code  1- Verbalizes Understanding      Flexibility, Balance, Mind/Body Relaxation: Provides group verbal/written instruction on the benefits of flexibility and balance training, including mind/body exercise modes such as yoga, pilates and tai chi.  Demonstration and skill practice provided.   Cardiac Rehab from 04/08/2016 in [facility] Cardiac and Pulmonary Rehab  Date  02/26/16  Educator  [facility]  Instruction Review Code (retired)  2- meets goals/outcomes      Stress and Anxiety: - Provides group verbal and written instruction about the health risks of elevated stress and causes of high stress.  Discuss the correlation between heart/lung disease and anxiety and treatment options. Review healthy ways to manage with stress and anxiety.   Depression: - Provides group verbal and written instruction on the correlation between heart/lung disease and depressed mood, treatment options, and the stigmas associated with seeking treatment.   Cardiac Rehab from 07/14/2017 in [facility] Cardiac and Pulmonary Rehab  Date  07/14/17  Educator  [facility]  Instruction Review Code  1- Verbalizes Understanding      Anatomy & Physiology of the Heart: - Group verbal and written instruction and models provide basic cardiac anatomy and physiology, with the coronary electrical and arterial systems. Review of Valvular disease and Heart Failure   Cardiac Rehab from 04/08/2016 in [facility] Cardiac and Pulmonary Rehab  Date  03/02/16  Educator  SB  Instruction Review Code (retired)  2- meets goals/outcomes      Cardiac Procedures: - Group verbal and written instruction to review commonly prescribed medications for heart disease. Reviews the medication, class of the drug, and side effects. Includes the steps to properly store meds and maintain the prescription regimen. (beta blockers and nitrates)   Cardiac Rehab from 07/14/2017 in [facility] Cardiac and Pulmonary Rehab  Date   06/23/17  Educator  CE  Instruction Review Code  1- Verbalizes Understanding      Cardiac Medications I: - Group verbal and written instruction to review commonly prescribed medications for heart disease. Reviews the medication, class of the drug, and side effects. Includes the steps to properly store meds and maintain the prescription regimen.   Cardiac Rehab from 07/14/2017 in [facility] Cardiac and Pulmonary Rehab  Date  06/07/17  Educator  SB  Instruction Review Code  1- Verbalizes Understanding      Cardiac Medications II: -Group verbal and written instruction to review commonly prescribed medications for heart disease. Reviews the medication, class of the drug, and side effects. (all other drug classes)    Go Sex-Intimacy & Heart Disease, Get SMART - Goal Setting: - Group verbal and written instruction through game format to discuss heart disease and the return to sexual intimacy. Provides group verbal and written material to discuss and apply goal setting through  the application of the S.M.A.R.T. Method.   Cardiac Rehab from 07/14/2017 in [facility] Cardiac and Pulmonary Rehab  Date  06/23/17  Educator  CE  Instruction Review Code  1- Verbalizes Understanding      Other Matters of the Heart: - Provides group verbal, written materials and models to describe Stable Angina and Peripheral Artery. Includes description of the disease process and treatment options available to the cardiac patient.   Exercise & Equipment Safety: - Individual verbal instruction and demonstration of equipment use and safety with use of the equipment.   Cardiac Rehab from 07/14/2017 in [facility] Cardiac and Pulmonary Rehab  Date  06/02/17  Educator  SB  Instruction Review Code  1- Verbalizes Understanding      Infection Prevention: - Provides verbal and written material to individual with discussion of infection control including proper hand washing and proper equipment cleaning during exercise session.   Cardiac  Rehab from 07/14/2017 in [facility] Cardiac and Pulmonary Rehab  Date  06/02/17  Educator  SB  Instruction Review Code  1- Verbalizes Understanding      Falls Prevention: - Provides verbal and written material to individual with discussion of falls prevention and safety.   Cardiac Rehab from 07/14/2017 in [facility] Cardiac and Pulmonary Rehab  Date  06/02/17  Educator  SB  Instruction Review Code  1- Verbalizes Understanding      Diabetes: - Individual verbal and written instruction to review signs/symptoms of diabetes, desired ranges of glucose level fasting, after meals and with exercise. Acknowledge that pre and post exercise glucose checks will be done for 3 sessions at entry of program.   Cardiac Rehab from 04/08/2016 in [facility] Cardiac and Pulmonary Rehab  Date  01/12/16  Educator  C. ENterkinRN  Instruction Review Code (retired)  2- meets goals/outcomes      Know Your Numbers and Risk Factors: -Group verbal and written instruction about important numbers in your health.  Discussion of what are risk factors and how they play a role in the disease process.  Review of Cholesterol, Blood Pressure, Diabetes, and BMI and the role they play in your overall health.   Sleep Hygiene: -Provides group verbal and written instruction about how sleep can affect your health.  Define sleep hygiene, discuss sleep cycles and impact of sleep habits. Review good sleep hygiene tips.    Other: -Provides group and verbal instruction on various topics (see comments)   Knowledge Questionnaire Score: Knowledge Questionnaire Score - 06/02/17 1344      Knowledge Questionnaire Score   Pre Score  -- Reviewed correct responses with Mildred today. She vebalized understanding of the responses and did not have any questions.        Core Components/Risk Factors/Patient Goals at Admission: Personal Goals and Risk Factors at Admission - 06/02/17 1341      Core Components/Risk Factors/Patient Goals on Admission     Weight Management  Yes;Obesity    Intervention  Weight Management: Develop a combined nutrition and exercise program designed to reach desired caloric intake, while maintaining appropriate intake of nutrient and fiber, sodium and fats, and appropriate energy expenditure required for the weight goal.    Admit Weight  173 lb (78.5 kg)    Goal Weight: Short Term  171 lb (77.6 kg)    Goal Weight: Long Term  159 lb (72.1 kg)    Expected Outcomes  Short Term: Continue to assess and modify interventions until short term weight is achieved;Long Term: Adherence to nutrition and physical activity/exercise  program aimed toward attainment of established weight goal;Weight Loss: Understanding of general recommendations for a balanced deficit meal plan, which promotes 1-2 lb weight loss per week and includes a negative energy balance of [phone] kcal/d    Diabetes  Yes    Intervention  Provide education about signs/symptoms and action to take for hypo/hyperglycemia.;Provide education about proper nutrition, including hydration, and aerobic/resistive exercise prescription along with prescribed medications to achieve blood glucose in normal ranges: Fasting glucose 65-99 mg/dL    Expected Outcomes  Short Term: Participant verbalizes understanding of the signs/symptoms and immediate care of hyper/hypoglycemia, proper foot care and importance of medication, aerobic/resistive exercise and nutrition plan for blood glucose control.;Long Term: Attainment of HbA1C < 7%.    Hypertension  Yes    Intervention  Provide education on lifestyle modifcations including regular physical activity/exercise, weight management, moderate sodium restriction and increased consumption of fresh fruit, vegetables, and low fat dairy, alcohol moderation, and smoking cessation.;Monitor prescription use compliance.    Expected Outcomes  Short Term: Continued assessment and intervention until BP is < 140/83m HG in hypertensive participants. < 130/858m HG in hypertensive participants with diabetes, heart failure or chronic kidney disease.;Long Term: Maintenance of blood pressure at goal levels.    Lipids  Yes    Intervention  Provide education and support for participant on nutrition & aerobic/resistive exercise along with prescribed medications to achieve LDL <7067mHDL >37m58m  Expected Outcomes  Short Term: Participant states understanding of desired cholesterol values and is compliant with medications prescribed. Participant is following exercise prescription and nutrition guidelines.;Long Term: Cholesterol controlled with medications as prescribed, with individualized exercise RX and with personalized nutrition plan. Value goals: LDL < 70mg28mL > 40 mg.       Core Components/Risk Factors/Patient Goals Review:  Goals and Risk Factor Review    Row Name 06/30/17 1025             Core Components/Risk Factors/Patient Goals Review   Review  Tashawn iRavynnoing to add on a Cardiac Rehab day since she will need a hysterectomy the first part of May. Tameeka hHafsahonly completed like 6/36 sessions of Cardiac rehab so far attending 2 days/week. Her highest blood sugars tha she reports is 172. Her blood pressure upon arrival to Cardiac REhab was 126/80.        Expected Outcomes  Floretta iElleanaoing to cont to exercise and attend Cardiac Rehab until she needs her hysterectomy.           Core Components/Risk Factors/Patient Goals at Discharge (Final Review):  Goals and Risk Factor Review - 06/30/17 1025      Core Components/Risk Factors/Patient Goals Review   Review  Cassey iAlmarosaoing to add on a Cardiac Rehab day since she will need a hysterectomy the first part of May. Anarosa hToniquaonly completed like 6/36 sessions of Cardiac rehab so far attending 2 days/week. Her highest blood sugars tha she reports is 172. Her blood pressure upon arrival to Cardiac REhab was 126/80.     Expected Outcomes  Karalina iMariateresaoing to cont to exercise and attend Cardiac Rehab until she needs her  hysterectomy.        ITP Comments: ITP Comments    Row Name 06/02/17 1336 06/22/17 0556 06/22/17 0632 07/14/17 1021 07/20/17 0548   ITP Comments  New orientation after recent angioplasty and chronic stable angina diagnosis.  ITP completed and sent to Dr MilleSabra Heckreview, changes as needed and signature. Documentation  of diagnosis can be found in [facility] 03/30/2017 and 04/25/2017 visits,   Came to class Tuesday 06/21/2017 with complaints of ankles swelling and increased shortness of breath since last Thursday. Advised she see her cardiologist before returning to program. Staff member took her via wheelchair to her cardiology office and she was able to see the NP that morning.  Brilinta was discontinued and PLavix added. No restrictions to return to class  30 Day review. Continue with ITP unless directed changes per Medical Director review.    run SVT? that [facility] felt. Sent to Dr. Alveria Apley office via wheelchair {taken by Alberteen Sam. Royelle reported that she had a similiar episode this am at home and felt it like she felt this SVT. Computer monitor read it as vtach but it had Pwaves.  Bea said she has been talking all her medicines as directed and does not drink caffeine. Janiylah reported that her primary care MD gave her an injection for her pulled muscle that she had after she left Cardiac REhab last time.   30 day review. Continue with ITP unless directed changes per Medical Director   [place]rae[place] Name 07/20/17 0552           ITP Comments  30 day review. Continue with ITP unless directed changes per Medical Director          Comments:

## 2017-07-20 NOTE — Progress Notes (Unsigned)
Cardiac Individual Treatment Plan  Patient Details  Name: Candace [name] MRN: [id number] Date of Birth: Apr 23, 1950 Referring Provider:     Cardiac Rehab from 06/02/2017 in [facility] Cardiac and Pulmonary Rehab  Referring Provider  Rosanna Randy      Initial Encounter Date:    Cardiac Rehab from 06/02/2017 in [facility] Cardiac and Pulmonary Rehab  Date  06/02/17  Referring Provider  Rosanna Randy      Visit Diagnosis: Chronic stable angina ([place])  S/P PTCA (percutaneous transluminal coronary angioplasty)  Patient's Home Medications on Admission:  Current Outpatient Medications:  .  acetaminophen-codeine (TYLENOL #3) 300-30 MG tablet, Take 1-2 tablets by mouth every 6 (six) hours as needed for moderate pain., Disp: 40 tablet, Rfl: 0 .  aspirin 81 MG chewable tablet, Chew 1 tablet (81 mg total) by mouth daily., Disp: 90 tablet, Rfl: 3 .  clopidogrel (PLAVIX) 75 MG tablet, Take 75 mg by mouth daily., Disp: , Rfl:  .  Coenzyme Q10 (CO Q 10) 100 MG CAPS, Take 100 mg by mouth daily., Disp: 30 capsule, Rfl: 12 .  diazepam (VALIUM) 2 MG tablet, Take 1 tablet by mouth 2 (two) times daily as needed., Disp: , Rfl:  .  hydrochlorothiazide (MICROZIDE) 12.5 MG capsule, Take 12.5 mg by mouth daily., Disp: , Rfl:  .  isosorbide mononitrate (IMDUR) 30 MG 24 hr tablet, Take 2 tablets (60 mg total) by mouth daily., Disp: 60 tablet, Rfl: 12 .  loperamide (IMODIUM) 2 MG capsule, Take by mouth as needed for diarrhea or loose stools., Disp: , Rfl:  .  metFORMIN (GLUCOPHAGE) 1000 MG tablet, Take 1 tablet (1,000 mg total) by mouth 2 (two) times daily with a meal., Disp: 180 tablet, Rfl: 3 .  metoprolol tartrate (LOPRESSOR) 25 MG tablet, Take 25 mg by mouth 2 (two) times daily. 1 in the pm with a 50 mg in the am, Disp: , Rfl:  .  metoprolol tartrate (LOPRESSOR) 50 MG tablet, TAKE 1 TABLET BY MOUTH TWICE A DAY FOR HIGH BLOOD PRESSURE (Patient taking differently: 1 in the am with a 25 mg in the pm), Disp: 60 tablet, Rfl: 12 .   nitroGLYCERIN (NITROSTAT) 0.4 MG SL tablet, Place 1 tablet (0.4 mg total) under the tongue every 5 (five) minutes as needed for chest pain., Disp: 30 tablet, Rfl: 0 .  ondansetron (ZOFRAN-ODT) 8 MG disintegrating tablet, DISSOLVE 1 TABLET ON THE TONGUE EVERY 8 HOURS AS NEEDED FOR NAUSEA OR VOMITING, Disp: 30 tablet, Rfl: 3 .  rosuvastatin (CRESTOR) 10 MG tablet, Take 1 tablet (10 mg total) by mouth daily., Disp: 90 tablet, Rfl: 3 .  tiZANidine (ZANAFLEX) 2 MG tablet, Take 2 mg by mouth every 6 (six) hours as needed for muscle spasms (patient is to titrate up to 6 at bedtime)., Disp: , Rfl:   Past Medical History: Past Medical History:  Diagnosis Date  . Allergy   . Anemia   . Anxiety   . Arrhythmia   . Arthritis   . Coronary artery disease   . Depression   . Diabetes mellitus without complication ([place])   . Dyspnea    doe  . Dysrhythmia   . GERD (gastroesophageal reflux disease)   . Headache   . Hyperlipidemia   . Hypertension   . Myocardial infarction ([place])    2016  . Myocardial infarction with cardiac rehabilitation [facility])    MI 2016/ CABG 8/17    FINISHED CARDIAC REHAB 3 WEEKS AGO  . Panic attack   . Reflux   .  Stroke ([place])   . Voice tremor     Tobacco Use: Social History   Tobacco Use  Smoking Status Former Smoker  . Packs/day: 0.50  . Types: Cigarettes  . Last attempt to quit: 10/07/2001  . Years since quitting: 15.7  Smokeless Tobacco Never Used    Labs: Recent Chemical engineer    Labs for ITP Cardiac and Pulmonary Rehab Latest Ref Rng & Units 11/11/2015 08/18/2016 10/01/2016 12/23/2016 03/24/2017   Cholestrol 100 - 199 mg/dL - - 259(H) - -   LDLCALC 0 - 99 mg/dL - - 173(H) - -   HDL >39 mg/dL - - 66 - -   Trlycerides 0 - 149 mg/dL - - 100 - -   Hemoglobin A1c <5.7 % of total Hgb - 8.1 6.9(H) 7.3(H) 7.8(H)   PHART 7.350 - 7.450 - - - - -   PCO2ART 35.0 - 45.0 mmHg - - - - -   HCO3 20.0 - 24.0 mEq/L - - - - -   TCO2 0 - 100 mmol/L 26 - - - -   O2SAT % - - -  - -       Exercise Target Goals:    Exercise Program Goal: Individual exercise prescription set using results from initial 6 min walk test and THRR while considering  patient's activity barriers and safety.   Exercise Prescription Goal: Initial exercise prescription builds to 30-45 minutes a day of aerobic activity, 2-3 days per week.  Home exercise guidelines will be given to patient during program as part of exercise prescription that the participant will acknowledge.  Activity Barriers & Risk Stratification: Activity Barriers & Cardiac Risk Stratification - 06/02/17 1340      Activity Barriers & Cardiac Risk Stratification   Activity Barriers  Back Problems;Deconditioning;Shortness of Breath;History of Falls;Chest Pain/Angina    Cardiac Risk Stratification  High       6 Minute Walk: 6 Minute Walk    Row Name 06/02/17 1427         6 Minute Walk   Distance  942 feet     Walk Time  6 minutes     # of Rest Breaks  0     MPH  1.78     METS  3.14     RPE  15     Perceived Dyspnea   2     VO2 Peak  10.99     Symptoms  Yes (comment)     Comments  legs fatigued     Resting HR  84 bpm     Resting BP  120/80     Resting Oxygen Saturation   96 %     Exercise Oxygen Saturation  during 6 min walk  99 %     Max Ex. HR  128 bpm     Max Ex. BP  192/104     2 Minute Post BP  148/74        Oxygen Initial Assessment:   Oxygen Re-Evaluation:   Oxygen Discharge (Final Oxygen Re-Evaluation):   Initial Exercise Prescription: Initial Exercise Prescription - 06/02/17 1400      Date of Initial Exercise RX and Referring Provider   Date  06/02/17    Referring Provider  Rosanna Randy      Recumbant Bike   Level  2    RPM  60    Watts  28    Minutes  15    METs  3      NuStep  Level  3    SPM  80    Minutes  15    METs  3      Track   Laps  45    Minutes  15    METs  3      Prescription Details   Frequency (times per week)  3    Duration  Progress to 30 minutes of  continuous aerobic without signs/symptoms of physical distress      Intensity   THRR 40-80% of Max Heartrate  112-140    Ratings of Perceived Exertion  11-13    Perceived Dyspnea  0-4      Resistance Training   Training Prescription  Yes    Weight  3 lb    Reps  10-15       Perform Capillary Blood Glucose checks as needed.  Exercise Prescription Changes: Exercise Prescription Changes    Row Name 06/02/17 1400 06/14/17 1500 06/16/17 0900 06/29/17 1400 07/12/17 1600     Response to Exercise   Blood Pressure (Admit)  120/80  106/64  -  114/64  108/60   Blood Pressure (Exercise)  192/104  140/80  -  164/62  135/64   Blood Pressure (Exit)  148/74  124/60  -  128/70  108/62   Heart Rate (Admit)  85 bpm  86 bpm  -  81 bpm  92 bpm   Heart Rate (Exercise)  128 bpm  135 bpm  -  117 bpm  136 bpm   Heart Rate (Exit)  96 bpm  105 bpm  -  88 bpm  88 bpm   Oxygen Saturation (Admit)  96 %  -  -  -  -   Oxygen Saturation (Exit)  99 %  -  -  -  -   Rating of Perceived Exertion (Exercise)  15  16  -  13  12   Symptoms  -  none  -  none  none   Comments  -  first full day of exercise  -  -  -   Duration  -  Continue with 30 min of aerobic exercise without signs/symptoms of physical distress.  -  Continue with 30 min of aerobic exercise without signs/symptoms of physical distress.  Continue with 30 min of aerobic exercise without signs/symptoms of physical distress.   Intensity  -  THRR unchanged  -  THRR unchanged  THRR unchanged     Progression   Progression  -  Continue to progress workloads to maintain intensity without signs/symptoms of physical distress.  -  Continue to progress workloads to maintain intensity without signs/symptoms of physical distress.  Continue to progress workloads to maintain intensity without signs/symptoms of physical distress.   Average METs  -  2.31  -  2.69  3.06     Resistance Training   Training Prescription  -  Yes  -  Yes  Yes   Weight  -  3 lb  -  3 lbs  3  lbs   Reps  -  10-15  -  10-15  10-15     Interval Training   Interval Training  -  No  -  No  No     Recumbant Bike   Level  -  -  -  -  3   Watts  -  -  -  -  26   Minutes  -  -  -  -  15  METs  -  -  -  -  2.98     NuStep   Level  -  3  -  3  3   Minutes  -  15  -  15  15   METs  -  2.7  -  3  3.3     Track   Laps  -  20  -  30  42   Minutes  -  15  -  15  15   METs  -  1.92  -  2.38  2.91     Home Exercise Plan   Plans to continue exercise at  -  -  Home (comment) walk and dance  Home (comment) walk and dance  Home (comment) walk and dance   Frequency  -  -  Add 3 additional days to program exercise sessions.  Add 3 additional days to program exercise sessions.  Add 3 additional days to program exercise sessions.   Initial Home Exercises Provided  -  -  06/16/17  06/16/17  06/16/17      Exercise Comments: Exercise Comments    Row Name 06/07/17 0947           Exercise Comments  Reviewed home exercise with pt today.  Pt plans to walk at home and stores for exercise.  Reviewed THR, pulse, RPE, sign and symptoms, and when to call 911 or MD.  Also discussed weather considerations and indoor options.  Pt voiced understanding.          Exercise Goals and Review: Exercise Goals    Row Name 06/02/17 1426             Exercise Goals   Increase Physical Activity  Yes       Intervention  Provide advice, education, support and counseling about physical activity/exercise needs.;Develop an individualized exercise prescription for aerobic and resistive training based on initial evaluation findings, risk stratification, comorbidities and participant's personal goals.       Expected Outcomes  Short Term: Attend rehab on a regular basis to increase amount of physical activity.;Long Term: Add in home exercise to make exercise part of routine and to increase amount of physical activity.;Long Term: Exercising regularly at least 3-5 days a week.       Increase Strength and Stamina   Yes       Intervention  Provide advice, education, support and counseling about physical activity/exercise needs.;Develop an individualized exercise prescription for aerobic and resistive training based on initial evaluation findings, risk stratification, comorbidities and participant's personal goals.       Expected Outcomes  Short Term: Increase workloads from initial exercise prescription for resistance, speed, and METs.;Short Term: Perform resistance training exercises routinely during rehab and add in resistance training at home;Long Term: Improve cardiorespiratory fitness, muscular endurance and strength as measured by increased METs and functional capacity (6MWT)       Able to understand and use rate of perceived exertion (RPE) scale  Yes       Intervention  Provide education and explanation on how to use RPE scale       Expected Outcomes  Short Term: Able to use RPE daily in rehab to express subjective intensity level;Long Term:  Able to use RPE to guide intensity level when exercising independently       Able to understand and use Dyspnea scale  Yes       Intervention  Provide education and explanation on how to use  Dyspnea scale       Expected Outcomes  Short Term: Able to use Dyspnea scale daily in rehab to express subjective sense of shortness of breath during exertion;Long Term: Able to use Dyspnea scale to guide intensity level when exercising independently       Knowledge and understanding of Target Heart Rate Range (THRR)  Yes       Intervention  Provide education and explanation of THRR including how the numbers were predicted and where they are located for reference       Expected Outcomes  Short Term: Able to state/look up THRR;Long Term: Able to use THRR to govern intensity when exercising independently;Short Term: Able to use daily as guideline for intensity in rehab       Able to check pulse independently  Yes       Intervention  Provide education and demonstration on how to check  pulse in carotid and radial arteries.;Review the importance of being able to check your own pulse for safety during independent exercise       Expected Outcomes  Short Term: Able to explain why pulse checking is important during independent exercise;Long Term: Able to check pulse independently and accurately       Understanding of Exercise Prescription  Yes       Intervention  Provide education, explanation, and written materials on patient's individual exercise prescription       Expected Outcomes  Short Term: Able to explain program exercise prescription;Long Term: Able to explain home exercise prescription to exercise independently          Exercise Goals Re-Evaluation : Exercise Goals Re-Evaluation    Row Name 06/07/17 0947 06/16/17 0930 06/29/17 1434 07/12/17 1603       Exercise Goal Re-Evaluation   Exercise Goals Review  Knowledge and understanding of Target Heart Rate Range (THRR);Able to understand and use rate of perceived exertion (RPE) scale;Understanding of Exercise Prescription  Understanding of Exercise Prescription;Increase Physical Activity;Increase Strength and Stamina;Knowledge and understanding of Target Heart Rate Range (THRR);Able to understand and use rate of perceived exertion (RPE) scale;Able to check pulse independently  Increase Physical Activity;Understanding of Exercise Prescription;Increase Strength and Stamina  Increase Physical Activity;Understanding of Exercise Prescription;Increase Strength and Stamina    Comments  Reviewed RPE scale, THR and program prescription with pt today.  Pt voiced understanding and was given a copy of goals to take home.   Reviewed home exercise with pt today.  Pt plans to walk and dance for exercise.  Reviewed THR, pulse, RPE, sign and symptoms, NTG use, and when to call 911 or MD.  Also discussed weather considerations and indoor options.  Pt voiced understanding.  Ayomide is doing well in rehab.  She is dancing around the track again serving as  entertainment to her classmates.  She is up to 30 laps.  We will continue to monitor her progression.   Faizah has continued to do well in rehab.  She was dancing again today and doing the twist to the music.  She was able to get 42 laps today!!  She is also up to 3.3 METs on the NuStep.  We will continue to monitor her progress.     Expected Outcomes  Short: Use RPE daily to regulate intensity.  Long: Follow program prescription in THR  Short: Add in one day of walking and dancing at home.  Long: Follow home exercise prescription.   Short: Continue to try to walk and dance at home.  Long: Continue  to build up strength and stamina.   Short: Increase workload on recumbent bike.  Long: Continue to be more active at home.        Discharge Exercise Prescription (Final Exercise Prescription Changes): Exercise Prescription Changes - 07/12/17 1600      Response to Exercise   Blood Pressure (Admit)  108/60    Blood Pressure (Exercise)  135/64    Blood Pressure (Exit)  108/62    Heart Rate (Admit)  92 bpm    Heart Rate (Exercise)  136 bpm    Heart Rate (Exit)  88 bpm    Rating of Perceived Exertion (Exercise)  12    Symptoms  none    Duration  Continue with 30 min of aerobic exercise without signs/symptoms of physical distress.    Intensity  THRR unchanged      Progression   Progression  Continue to progress workloads to maintain intensity without signs/symptoms of physical distress.    Average METs  3.06      Resistance Training   Training Prescription  Yes    Weight  3 lbs    Reps  10-15      Interval Training   Interval Training  No      Recumbant Bike   Level  3    Watts  26    Minutes  15    METs  2.98      NuStep   Level  3    Minutes  15    METs  3.3      Track   Laps  42    Minutes  15    METs  2.91      Home Exercise Plan   Plans to continue exercise at  Home (comment) walk and dance    Frequency  Add 3 additional days to program exercise sessions.    Initial Home  Exercises Provided  06/16/17       Nutrition:  Target Goals: Understanding of nutrition guidelines, daily intake of sodium <1543m, cholesterol <2016m calories 30% from fat and 7% or less from saturated fats, daily to have 5 or more servings of fruits and vegetables.  Biometrics: Pre Biometrics - 06/02/17 1425      Pre Biometrics   Height  5' 2.75" (1.594 m)    Weight  173 lb 8 oz (78.7 kg)    Waist Circumference  38 inches    Hip Circumference  42.5 inches    Waist to Hip Ratio  0.89 %    BMI (Calculated)  30.97    Single Leg Stand  4.26 seconds        Nutrition Therapy Plan and Nutrition Goals: Nutrition Therapy & Goals - 06/09/17 0957      Nutrition Therapy   Diet  TLC/ DASH    Protein (specify units)  11oz    Fiber  25 grams    Whole Grain Foods  3 servings    Saturated Fats  14 max. grams    Fruits and Vegetables  4 servings/day 8 ideal    Sodium  1500 grams      Personal Nutrition Goals   Nutrition Goal  Consume sources of protein more regularly, ideally daily she agrees to drink more milk or cheese    Personal Goal #2  Consume Glucerna nutritonal drinks more regularly, use as a meal replacement or snack between meals eats 1-2 meals/day typically    Personal Goal #3  Eat foods that have a stong smell  such as soups or that have strong flavorings such as citrus or salt-free herbs/ spices    Comments  patient c/o decreased appetite and taste changes. she has made changes to her diet in the past year including eating more fruits and vegetables, decreasing her sodium intake, and eating less fried foods/ red meats      Intervention Plan   Intervention  Prescribe, educate and counsel regarding individualized specific dietary modifications aiming towards targeted core components such as weight, hypertension, lipid management, diabetes, heart failure and other comorbidities.    Expected Outcomes  Short Term Goal: Understand basic principles of dietary content, such as  calories, fat, sodium, cholesterol and nutrients.;Short Term Goal: A plan has been developed with personal nutrition goals set during dietitian appointment.;Long Term Goal: Adherence to prescribed nutrition plan.       Nutrition Assessments: Nutrition Assessments - 06/02/17 1345      MEDFICTS Scores   Pre Score  10       Nutrition Goals Re-Evaluation: Nutrition Goals Re-Evaluation    [place]io Name 06/30/17 1023             Goals   Current Weight  172 lb (78 kg)       Nutrition Goal  Kasia has been trying to eat protein when she eats.          Personal Goal #2 Re-Evaluation   Personal Goal #2  [place] said she has not been as hungry and not eating as much since she needs a hysterectomy since she has a fibroid that needs to be removed and she feels it is pressing on her abd.          Nutrition Goals Discharge (Final Nutrition Goals Re-Evaluation): Nutrition Goals Re-Evaluation - 06/30/17 1023      Goals   Current Weight  172 lb (78 kg)    Nutrition Goal  Evany has been trying to eat protein when she eats.       Personal Goal #2 Re-Evaluation   Personal Goal #2  Bettey said she has not been as hungry and not eating as much since she needs a hysterectomy since she has a fibroid that needs to be removed and she feels it is pressing on her abd.       Psychosocial: Target Goals: Acknowledge presence or absence of significant depression and/or stress, maximize coping skills, provide positive support system. Participant is able to verbalize types and ability to use techniques and skills needed for reducing stress and depression.   Initial Review & Psychosocial Screening: Initial Psych Review & Screening - 06/02/17 1342      Initial Review   Current issues with  Current Stress Concerns    Source of Stress Concerns  Unable to participate in former interests or hobbies;Unable to perform yard/household activities      [place]?  Yes      Barriers   Psychosocial  barriers to participate in program  There are no identifiable barriers or psychosocial needs.;The patient should benefit from training in stress management and relaxation.      Screening Interventions   Interventions  Encouraged to exercise;Provide feedback about the scores to participant;To provide support and resources with identified psychosocial needs    Expected Outcomes  Short Term goal: Utilizing psychosocial counselor, staff and physician to assist with identification of specific Stressors or current issues interfering with healing process. Setting desired goal for each stressor or current issue identified.;Long Term Goal: Stressors or  current issues are controlled or eliminated.;Short Term goal: Identification and review with participant of any Quality of Life or Depression concerns found by scoring the questionnaire.;Long Term goal: The participant improves quality of Life and PHQ9 Scores as seen by post scores and/or verbalization of changes       Quality of Life Scores:  Quality of Life - 06/02/17 1204      Quality of Life Scores   Health/Function Pre  9.47 %    Socioeconomic Pre  18058 %    Psych/Spiritual Pre  18.86 %    Family Pre  21.9 %    GLOBAL Pre  15 %      Scores of 19 and below usually indicate a poorer quality of life in these areas.  A difference of  2-3 points is a clinically meaningful difference.  A difference of 2-3 points in the total score of the Quality of Life Index has been associated with significant improvement in overall quality of life, self-image, physical symptoms, and general health in studies assessing change in quality of life.  PHQ-9: Recent Review Flowsheet Data    Depression screen [facility] 2/9 06/02/2017 04/25/2017 11/04/2016 07/28/2016 03/16/2016   Decreased Interest 2 0 0 3 1   Down, Depressed, Hopeless 1 0 0 0 1   PHQ - 2 Score 3 0 0 3 2   Altered sleeping 3 3 0 0 0   Tired, decreased energy 2 3 0 3 1   Change in appetite 2 2 0 3 1   Feeling bad or  failure about yourself  1 0 0 0 0   Trouble concentrating 1 0 0 0 0   Moving slowly or fidgety/restless 2 0 0 0 0   Suicidal thoughts 0 2  - 0 0   PHQ-9 Score 14 10 0 9 4   Difficult doing work/chores Not difficult at all Somewhat difficult - - Not difficult at all     Interpretation of Total Score  Total Score Depression Severity:  1-4 = Minimal depression, 5-9 = Mild depression, 10-14 = Moderate depression, 15-19 = Moderately severe depression, 20-27 = Severe depression   Psychosocial Evaluation and Intervention: Psychosocial Evaluation - 06/30/17 0949      Psychosocial Evaluation & Interventions   Comments  Counselor met with Ms Dalporto [facility]) today for initial psychosocial evaluation.  She returned to this program after a year due to a recent heart attack.  Lourene has a strong support system living with her daughter; multiple sisters and brothers and other children and she has home health involved in transporting her to/from this program.  Avree has had multiple falls lately and is being seen by a neurologist for chronic headaches.  She also is not sleeping well (maybe 3-4 hours/night) and is seeing the "Sleep Dr" next month about following her on this.  Angelly states her appetite is not good and she has more anxiety lately since she can't drive or live independently while she is a fall risk.  This is her primary stress and her goal for this program is "to get my life back."  Staff will follow with Cyndal while in this program.    Expected [place] will see the sleep Dr. to help with her sleep concerns.  Long - Jaanvi will exercise consistently to achieve her stated goal of feeling stronger and getting her "life back."         Psychosocial Re-Evaluation: Psychosocial Re-Evaluation    Row Name 06/30/17  1027             Psychosocial Re-Evaluation   Comments  Chasmine is still unable to drive but she said when she sees her MD in Sept she hopes he will let her drive. Jasmen reports that she is  having problems sleeping still due to her chronic headaches. Airam said accupuncture for her headaches she had to stop since it hurt too much. Mayra said her neurologist did not want her to go to a chiropracter for her headaches. She has been on medicine for her headaches for several months but  Arlisa reports she still has headaches.        Interventions  Encouraged to attend Cardiac Rehabilitation for the exercise          Psychosocial Discharge (Final Psychosocial Re-Evaluation): Psychosocial Re-Evaluation - 06/30/17 1027      Psychosocial Re-Evaluation   Comments  Oma is still unable to drive but she said when she sees her MD in Sept she hopes he will let her drive. Tersea reports that she is having problems sleeping still due to her chronic headaches. Tameya said accupuncture for her headaches she had to stop since it hurt too much. Ryleah said her neurologist did not want her to go to a chiropracter for her headaches. She has been on medicine for her headaches for several months but  Miabella reports she still has headaches.     Interventions  Encouraged to attend Cardiac Rehabilitation for the exercise       Vocational Rehabilitation: Provide vocational rehab assistance to qualifying candidates.   Vocational Rehab Evaluation & Intervention: Vocational Rehab - 06/02/17 1206      Initial Vocational Rehab Evaluation & Intervention   Assessment shows need for Vocational Rehabilitation  No       Education: Education Goals: Education classes will be provided on a variety of topics geared toward better understanding of heart health and risk factor modification. Participant will state understanding/return demonstration of topics presented as noted by education test scores.  Learning Barriers/Preferences: Learning Barriers/Preferences - 06/02/17 1343      Learning Barriers/Preferences   Learning Barriers  Exercise Concerns Exertion causes SOB.   Chronic headache from back problems    Learning Preferences   None       Education Topics:  AED/CPR: - Group verbal and written instruction with the use of models to demonstrate the basic use of the AED with the basic ABC's of resuscitation.   Cardiac Rehab from 07/14/2017 in [facility] Cardiac and Pulmonary Rehab  Date  06/30/17  Educator  CE  Instruction Review Code  1- Verbalizes Understanding      General Nutrition Guidelines/Fats and Fiber: -Group instruction provided by verbal, written material, models and posters to present the general guidelines for heart healthy nutrition. Gives an explanation and review of dietary fats and fiber.   Cardiac Rehab from 04/08/2016 in [facility] Cardiac and Pulmonary Rehab  Date  04/06/16  Educator  PI  Instruction Review Code (retired)  2- meets goals/outcomes      Controlling Sodium/Reading Food Labels: -Group verbal and written material supporting the discussion of sodium use in heart healthy nutrition. Review and explanation with models, verbal and written materials for utilization of the food label.   Cardiac Rehab from 07/14/2017 in [facility] Cardiac and Pulmonary Rehab  Date  06/28/17  Educator  PI  Instruction Review Code  1- Verbalizes Understanding      Exercise Physiology & General Exercise Guidelines: - Group verbal and  written instruction with models to review the exercise physiology of the cardiovascular system and associated critical values. Provides general exercise guidelines with specific guidelines to those with heart or lung disease.    Cardiac Rehab from 07/14/2017 in [facility] Cardiac and Pulmonary Rehab  Date  07/07/17  Educator  [facility]  Instruction Review Code  1- Verbalizes Understanding      Aerobic Exercise & Resistance Training: - Gives group verbal and written instruction on the various components of exercise. Focuses on aerobic and resistive training programs and the benefits of this training and how to safely progress through these programs..   Cardiac Rehab from 07/14/2017 in [facility] Cardiac and  Pulmonary Rehab  Date  07/12/17  Educator  AS  Instruction Review Code  1- Verbalizes Understanding      Flexibility, Balance, Mind/Body Relaxation: Provides group verbal/written instruction on the benefits of flexibility and balance training, including mind/body exercise modes such as yoga, pilates and tai chi.  Demonstration and skill practice provided.   Cardiac Rehab from 04/08/2016 in [facility] Cardiac and Pulmonary Rehab  Date  02/26/16  Educator  [facility]  Instruction Review Code (retired)  2- meets goals/outcomes      Stress and Anxiety: - Provides group verbal and written instruction about the health risks of elevated stress and causes of high stress.  Discuss the correlation between heart/lung disease and anxiety and treatment options. Review healthy ways to manage with stress and anxiety.   Depression: - Provides group verbal and written instruction on the correlation between heart/lung disease and depressed mood, treatment options, and the stigmas associated with seeking treatment.   Cardiac Rehab from 07/14/2017 in [facility] Cardiac and Pulmonary Rehab  Date  07/14/17  Educator  [facility]  Instruction Review Code  1- Verbalizes Understanding      Anatomy & Physiology of the Heart: - Group verbal and written instruction and models provide basic cardiac anatomy and physiology, with the coronary electrical and arterial systems. Review of Valvular disease and Heart Failure   Cardiac Rehab from 04/08/2016 in [facility] Cardiac and Pulmonary Rehab  Date  03/02/16  Educator  SB  Instruction Review Code (retired)  2- meets goals/outcomes      Cardiac Procedures: - Group verbal and written instruction to review commonly prescribed medications for heart disease. Reviews the medication, class of the drug, and side effects. Includes the steps to properly store meds and maintain the prescription regimen. (beta blockers and nitrates)   Cardiac Rehab from 07/14/2017 in [facility] Cardiac and Pulmonary Rehab  Date   06/23/17  Educator  CE  Instruction Review Code  1- Verbalizes Understanding      Cardiac Medications I: - Group verbal and written instruction to review commonly prescribed medications for heart disease. Reviews the medication, class of the drug, and side effects. Includes the steps to properly store meds and maintain the prescription regimen.   Cardiac Rehab from 07/14/2017 in [facility] Cardiac and Pulmonary Rehab  Date  06/07/17  Educator  SB  Instruction Review Code  1- Verbalizes Understanding      Cardiac Medications II: -Group verbal and written instruction to review commonly prescribed medications for heart disease. Reviews the medication, class of the drug, and side effects. (all other drug classes)    Go Sex-Intimacy & Heart Disease, Get SMART - Goal Setting: - Group verbal and written instruction through game format to discuss heart disease and the return to sexual intimacy. Provides group verbal and written material to discuss and apply goal setting through  the application of the S.M.A.R.T. Method.   Cardiac Rehab from 07/14/2017 in [facility] Cardiac and Pulmonary Rehab  Date  06/23/17  Educator  CE  Instruction Review Code  1- Verbalizes Understanding      Other Matters of the Heart: - Provides group verbal, written materials and models to describe Stable Angina and Peripheral Artery. Includes description of the disease process and treatment options available to the cardiac patient.   Exercise & Equipment Safety: - Individual verbal instruction and demonstration of equipment use and safety with use of the equipment.   Cardiac Rehab from 07/14/2017 in [facility] Cardiac and Pulmonary Rehab  Date  06/02/17  Educator  SB  Instruction Review Code  1- Verbalizes Understanding      Infection Prevention: - Provides verbal and written material to individual with discussion of infection control including proper hand washing and proper equipment cleaning during exercise session.   Cardiac  Rehab from 07/14/2017 in [facility] Cardiac and Pulmonary Rehab  Date  06/02/17  Educator  SB  Instruction Review Code  1- Verbalizes Understanding      Falls Prevention: - Provides verbal and written material to individual with discussion of falls prevention and safety.   Cardiac Rehab from 07/14/2017 in [facility] Cardiac and Pulmonary Rehab  Date  06/02/17  Educator  SB  Instruction Review Code  1- Verbalizes Understanding      Diabetes: - Individual verbal and written instruction to review signs/symptoms of diabetes, desired ranges of glucose level fasting, after meals and with exercise. Acknowledge that pre and post exercise glucose checks will be done for 3 sessions at entry of program.   Cardiac Rehab from 04/08/2016 in [facility] Cardiac and Pulmonary Rehab  Date  01/12/16  Educator  C. ENterkinRN  Instruction Review Code (retired)  2- meets goals/outcomes      Know Your Numbers and Risk Factors: -Group verbal and written instruction about important numbers in your health.  Discussion of what are risk factors and how they play a role in the disease process.  Review of Cholesterol, Blood Pressure, Diabetes, and BMI and the role they play in your overall health.   Sleep Hygiene: -Provides group verbal and written instruction about how sleep can affect your health.  Define sleep hygiene, discuss sleep cycles and impact of sleep habits. Review good sleep hygiene tips.    Other: -Provides group and verbal instruction on various topics (see comments)   Knowledge Questionnaire Score: Knowledge Questionnaire Score - 06/02/17 1344      Knowledge Questionnaire Score   Pre Score  -- Reviewed correct responses with Nekeya today. She vebalized understanding of the responses and did not have any questions.        Core Components/Risk Factors/Patient Goals at Admission: Personal Goals and Risk Factors at Admission - 06/02/17 1341      Core Components/Risk Factors/Patient Goals on Admission     Weight Management  Yes;Obesity    Intervention  Weight Management: Develop a combined nutrition and exercise program designed to reach desired caloric intake, while maintaining appropriate intake of nutrient and fiber, sodium and fats, and appropriate energy expenditure required for the weight goal.    Admit Weight  173 lb (78.5 kg)    Goal Weight: Short Term  171 lb (77.6 kg)    Goal Weight: Long Term  159 lb (72.1 kg)    Expected Outcomes  Short Term: Continue to assess and modify interventions until short term weight is achieved;Long Term: Adherence to nutrition and physical activity/exercise  program aimed toward attainment of established weight goal;Weight Loss: Understanding of general recommendations for a balanced deficit meal plan, which promotes 1-2 lb weight loss per week and includes a negative energy balance of [phone] kcal/d    Diabetes  Yes    Intervention  Provide education about signs/symptoms and action to take for hypo/hyperglycemia.;Provide education about proper nutrition, including hydration, and aerobic/resistive exercise prescription along with prescribed medications to achieve blood glucose in normal ranges: Fasting glucose 65-99 mg/dL    Expected Outcomes  Short Term: Participant verbalizes understanding of the signs/symptoms and immediate care of hyper/hypoglycemia, proper foot care and importance of medication, aerobic/resistive exercise and nutrition plan for blood glucose control.;Long Term: Attainment of HbA1C < 7%.    Hypertension  Yes    Intervention  Provide education on lifestyle modifcations including regular physical activity/exercise, weight management, moderate sodium restriction and increased consumption of fresh fruit, vegetables, and low fat dairy, alcohol moderation, and smoking cessation.;Monitor prescription use compliance.    Expected Outcomes  Short Term: Continued assessment and intervention until BP is < 140/83m HG in hypertensive participants. < 130/858m HG in hypertensive participants with diabetes, heart failure or chronic kidney disease.;Long Term: Maintenance of blood pressure at goal levels.    Lipids  Yes    Intervention  Provide education and support for participant on nutrition & aerobic/resistive exercise along with prescribed medications to achieve LDL <7067mHDL >37m58m  Expected Outcomes  Short Term: Participant states understanding of desired cholesterol values and is compliant with medications prescribed. Participant is following exercise prescription and nutrition guidelines.;Long Term: Cholesterol controlled with medications as prescribed, with individualized exercise RX and with personalized nutrition plan. Value goals: LDL < 70mg28mL > 40 mg.       Core Components/Risk Factors/Patient Goals Review:  Goals and Risk Factor Review    Row Name 06/30/17 1025             Core Components/Risk Factors/Patient Goals Review   Review  Anecia iRavynnoing to add on a Cardiac Rehab day since she will need a hysterectomy the first part of May. Shyna hHafsahonly completed like 6/36 sessions of Cardiac rehab so far attending 2 days/week. Her highest blood sugars tha she reports is 172. Her blood pressure upon arrival to Cardiac REhab was 126/80.        Expected Outcomes  Maja iElleanaoing to cont to exercise and attend Cardiac Rehab until she needs her hysterectomy.           Core Components/Risk Factors/Patient Goals at Discharge (Final Review):  Goals and Risk Factor Review - 06/30/17 1025      Core Components/Risk Factors/Patient Goals Review   Review  Siennah iAlmarosaoing to add on a Cardiac Rehab day since she will need a hysterectomy the first part of May. Makynleigh hToniquaonly completed like 6/36 sessions of Cardiac rehab so far attending 2 days/week. Her highest blood sugars tha she reports is 172. Her blood pressure upon arrival to Cardiac REhab was 126/80.     Expected Outcomes  Nishita iMariateresaoing to cont to exercise and attend Cardiac Rehab until she needs her  hysterectomy.        ITP Comments: ITP Comments    Row Name 06/02/17 1336 06/22/17 0556 06/22/17 0632 07/14/17 1021 07/20/17 0548   ITP Comments  New orientation after recent angioplasty and chronic stable angina diagnosis.  ITP completed and sent to Dr MilleSabra Heckreview, changes as needed and signature. Documentation  of diagnosis can be found in [facility] 03/30/2017 and 04/25/2017 visits,   Came to class Tuesday 06/21/2017 with complaints of ankles swelling and increased shortness of breath since last Thursday. Advised she see her cardiologist before returning to program. Staff member took her via wheelchair to her cardiology office and she was able to see the NP that morning.  Brilinta was discontinued and PLavix added. No restrictions to return to class  30 Day review. Continue with ITP unless directed changes per Medical Director review.    run SVT? that [facility] felt. Sent to Dr. Alveria Apley office via wheelchair {taken by Alberteen Sam. Denae reported that she had a similiar episode this am at home and felt it like she felt this SVT. Computer monitor read it as vtach but it had Pwaves.  Toi said she has been talking all her medicines as directed and does not drink caffeine. Miasha reported that her primary care MD gave her an injection for her pulled muscle that she had after she left Cardiac REhab last time.   30 day review. Continue with ITP unless directed changes per Medical Director      Comments:

## 2017-07-21 ENCOUNTER — Encounter: Payer: Medicare Other | Admitting: *Deleted

## 2017-07-21 DIAGNOSIS — I208 Other forms of angina pectoris: Secondary | ICD-10-CM

## 2017-07-21 DIAGNOSIS — I25118 Atherosclerotic heart disease of native coronary artery with other forms of angina pectoris: Secondary | ICD-10-CM | POA: Diagnosis not present

## 2017-07-21 NOTE — Progress Notes (Signed)
Daily Session Note  Patient Details  Name: Candace [name] MRN: [id number] Date of Birth: 23-Apr-1950 Referring Provider:     Cardiac Rehab from 06/02/2017 in [facility] Cardiac and Pulmonary Rehab  Referring Provider  Rosanna Randy      Encounter Date: 07/21/2017  Check In: Session Check In - 07/21/17 0823      Check-In   Location  [facility]-Cardiac & Pulmonary Rehab    Staff Present  Alberteen Sam, MA, RCEP, CCRP, Exercise Physiologist;Amanda Oletta Darter, BA, ACSM CEP, Exercise Physiologist;Carroll Enterkin, RN, BSN    Supervising physician immediately available to respond to emergencies  See telemetry face sheet for immediately available ER MD    Medication changes reported      No    Fall or balance concerns reported     No    Warm-up and Cool-down  Performed on first and last piece of equipment    Resistance Training Performed  Yes    VAD Patient?  No      Pain Assessment   Currently in Pain?  No/denies          Social History   Tobacco Use  Smoking Status Former Smoker  . Packs/day: 0.50  . Types: Cigarettes  . Last attempt to quit: 10/07/2001  . Years since quitting: 15.7  Smokeless Tobacco Never Used    Goals Met:  Independence with exercise equipment Exercise tolerated well No report of cardiac concerns or symptoms Strength training completed today  Goals Unmet:  Not Applicable  Comments: Pt able to follow exercise prescription today without complaint.  Will continue to monitor for progression.    Dr. Emily Filbert is Medical Director for [facility] and [facility].

## 2017-07-26 ENCOUNTER — Other Ambulatory Visit: Payer: Self-pay

## 2017-07-26 ENCOUNTER — Emergency Department (HOSPITAL_COMMUNITY): Payer: Medicare Other

## 2017-07-26 ENCOUNTER — Emergency Department (HOSPITAL_COMMUNITY)
Admission: EM | Admit: 2017-07-26 | Discharge: 2017-07-27 | Disposition: A | Discharge status: Home / Self Care | Payer: Medicare Other | Attending: Emergency Medicine | Admitting: Emergency Medicine

## 2017-07-26 DIAGNOSIS — I25118 Atherosclerotic heart disease of native coronary artery with other forms of angina pectoris: Secondary | ICD-10-CM | POA: Diagnosis not present

## 2017-07-26 DIAGNOSIS — E119 Type 2 diabetes mellitus without complications: Secondary | ICD-10-CM | POA: Diagnosis not present

## 2017-07-26 DIAGNOSIS — I252 Old myocardial infarction: Secondary | ICD-10-CM | POA: Insufficient documentation

## 2017-07-26 DIAGNOSIS — Z87891 Personal history of nicotine dependence: Secondary | ICD-10-CM | POA: Diagnosis not present

## 2017-07-26 DIAGNOSIS — I208 Other forms of angina pectoris: Secondary | ICD-10-CM

## 2017-07-26 DIAGNOSIS — Z79899 Other long term (current) drug therapy: Secondary | ICD-10-CM | POA: Insufficient documentation

## 2017-07-26 DIAGNOSIS — Z951 Presence of aortocoronary bypass graft: Secondary | ICD-10-CM

## 2017-07-26 DIAGNOSIS — I25119 Atherosclerotic heart disease of native coronary artery with unspecified angina pectoris: Secondary | ICD-10-CM | POA: Insufficient documentation

## 2017-07-26 DIAGNOSIS — Z7984 Long term (current) use of oral hypoglycemic drugs: Secondary | ICD-10-CM | POA: Insufficient documentation

## 2017-07-26 DIAGNOSIS — I10 Essential (primary) hypertension: Secondary | ICD-10-CM | POA: Insufficient documentation

## 2017-07-26 DIAGNOSIS — R079 Chest pain, unspecified: Secondary | ICD-10-CM | POA: Diagnosis present

## 2017-07-26 DIAGNOSIS — Z9861 Coronary angioplasty status: Secondary | ICD-10-CM

## 2017-07-26 LAB — BASIC METABOLIC PANEL
Anion gap: 11 (ref 5–15)
BUN: 13 mg/dL (ref 6–20)
CO2: 26 mmol/L (ref 22–32)
Calcium: 9.2 mg/dL (ref 8.9–10.3)
Chloride: 102 mmol/L (ref 101–111)
Creatinine, Ser: 0.85 mg/dL (ref 0.44–1.00)
GFR calc Af Amer: >60 mL/min (ref >60)
GFR calc non Af Amer: >60 mL/min (ref >60)
Glucose, Bld: 277 mg/dL — ABNORMAL HIGH (ref 65–99)
Potassium: 3.6 mmol/L (ref 3.5–5.1)
Sodium: 139 mmol/L (ref 135–145)

## 2017-07-26 LAB — CBC WITH DIFFERENTIAL/PLATELET
Basophils Absolute: 0 10*3/uL (ref 0.0–0.1)
Basophils Relative: 0 %
Eosinophils Absolute: 0.1 10*3/uL (ref 0.0–0.7)
Eosinophils Relative: 2 %
HCT: 32 % — ABNORMAL LOW (ref 36.0–46.0)
Hemoglobin: 10.8 g/dL — ABNORMAL LOW (ref 12.0–15.0)
Lymphocytes Relative: 34 %
Lymphs Abs: 2.5 10*3/uL (ref 0.7–4.0)
MCH: 31.4 pg (ref 26.0–34.0)
MCHC: 33.8 g/dL (ref 30.0–36.0)
MCV: 93 fL (ref 78.0–100.0)
Monocytes Absolute: 0.5 10*3/uL (ref 0.1–1.0)
Monocytes Relative: 7 %
Neutro Abs: 4.1 10*3/uL (ref 1.7–7.7)
Neutrophils Relative %: 57 %
Platelets: 220 10*3/uL (ref 150–400)
RBC: 3.44 MIL/uL — ABNORMAL LOW (ref 3.87–5.11)
RDW: 13.2 % (ref 11.5–15.5)
WBC: 7.3 10*3/uL (ref 4.0–10.5)

## 2017-07-26 LAB — TROPONIN I: Troponin I: <0.03 ng/mL (ref <0.03)

## 2017-07-26 NOTE — ED Triage Notes (Signed)
Pt brought in by [facility] from home for CP that started around 1130 this am. Pt rates pain 8/10, radiating to left arm, abdomen, and back. Pt diaphoretic and c/o nausea. Pt has significant cardiac hx of bypass in 2017 and multiple MIs and stents. Pt took x2 home nitro wi/o relief. Pt given 324mg  aspirin PTA. Pt A+Ox4 and in NAD.

## 2017-07-26 NOTE — Progress Notes (Signed)
Daily Session Note  Patient Details  Name: Candace [name] MRN: [id number] Date of Birth: 02/29/1951 Referring Provider:     Cardiac Rehab from 06/02/2017 in [facility] Cardiac and Pulmonary Rehab  Referring Provider  Gilbert      Encounter Date: 07/26/2017  Check In: Session Check In - 07/26/17 0918      Check-In   Staff Present  Jessica Hawkins, MA, RCEP, CCRP, Exercise Physiologist;Amanda Sommer, BA, ACSM CEP, Exercise Physiologist;Susanne Bice, RN, BSN, CCRP    Supervising physician immediately available to respond to emergencies  See telemetry face sheet for immediately available ER MD    Medication changes reported      No    Fall or balance concerns reported     No    Warm-up and Cool-down  Performed on first and last piece of equipment    Resistance Training Performed  Yes    VAD Patient?  No      Pain Assessment   Currently in Pain?  No/denies    Multiple Pain Sites  No          Social History   Tobacco Use  Smoking Status Former Smoker  . Packs/day: 0.50  . Types: Cigarettes  . Last attempt to quit: 10/07/2001  . Years since quitting: 15.8  Smokeless Tobacco Never Used    Goals Met:  Independence with exercise equipment Exercise tolerated well No report of cardiac concerns or symptoms Strength training completed today  Goals Unmet:  Not Applicable  Comments: Pt able to follow exercise prescription today without complaint.  Will continue to monitor for progression.    Dr. Mark Miller is Medical Director for [facility] and [facility]. 

## 2017-07-27 NOTE — ED Provider Notes (Signed)
[facility] EMERGENCY DEPARTMENT Provider Note   CSN: [id number] Arrival date & time: 07/26/17  2139     History   Chief Complaint Chief Complaint  Patient presents with  . Chest Pain    HPI Candace [name] is a 67 y.o. female.  HPI   67 year old female with chest pain.  Patient has frequent anginal pain.  Today it was a little bit worse than typical. Onset around 1130. Was doing light activity at the time.  She describes pain in the center of her chest with radiation to her arm, abdomen and back.  Associate with some mild nausea.  She did take some nitroglycerin with some relief.  Currently symptom-free.  No fevers or chills.  No cough.  No shortness of breath.  Past Medical History:  Diagnosis Date  . Allergy   . Anemia   . Anxiety   . Arrhythmia   . Arthritis   . Coronary artery disease   . Depression   . Diabetes mellitus without complication ([place])   . Dyspnea    doe  . Dysrhythmia   . GERD (gastroesophageal reflux disease)   . Headache   . Hyperlipidemia   . Hypertension   . Myocardial infarction ([place])    2016  . Myocardial infarction with cardiac rehabilitation [facility])    MI 2016/ CABG 8/17    FINISHED CARDIAC REHAB 3 WEEKS AGO  . Panic attack   . Reflux   . Stroke ([place])   . Voice tremor     Patient Active Problem List   Diagnosis Date Noted  . B12 deficiency 12/01/2016  . Vitamin D deficiency 11/04/2016  . Depression with anxiety 09/30/2016  . PAF (paroxysmal atrial fibrillation) ([place]) 09/30/2016  . Chronic fatigue 09/30/2016  . Resting tremor 09/30/2016  . Nausea & vomiting 09/30/2016  . Mild obstructive sleep apnea 09/30/2016  . Headache 08/18/2016  . Abnormal stress test 08/05/2016  . Unstable angina ([place]) 08/03/2016  . Diabetes ([place]) 08/03/2016  . Snoring 02/13/2016  . Asthma 11/11/2015  . S/P CABG x 4 11/10/2015  . Coronary artery disease 11/07/2015  . Carotid artery narrowing 02/08/2014  . Chest pain 02/08/2014  .  Mixed hyperlipidemia 02/08/2014  . Atypical chest pain 02/07/2014  . Essential (primary) hypertension 02/07/2014  . Awareness of heartbeats 02/07/2014  . Abdominal pain 10/05/2013  . D (diarrhea) 10/05/2013  . Acid reflux 10/05/2013    Past Surgical History:  Procedure Laterality Date  . APPENDECTOMY    . ARTERY BIOPSY Right 04/26/2016   Procedure: BIOPSY TEMPORAL ARTERY;  Surgeon: Margaretha Sheffield, MD;  Location: [facility] ORS;  Service: ENT;  Laterality: Right;  . CARDIAC CATHETERIZATION N/A 11/06/2015   Procedure: Left Heart Cath and Coronary Angiography;  Surgeon: Corey Skains, MD;  Location: [place] CV LAB;  Service: Cardiovascular;  Laterality: N/A;  . CESAREAN SECTION    . CORONARY ARTERY BYPASS GRAFT N/A 11/10/2015   Procedure: CORONARY ARTERY BYPASS GRAFTING (CABG), ON PUMP, TIMES FOUR, USING LEFT INTERNAL MAMMARY ARTERY, BILATERAL GREATER SAPHENOUS VEINS HARVESTED ENDOSCOPICALLY;  Surgeon: [name], MD;  Location: [place];  Service: Open Heart Surgery;  Laterality: N/A;  LIMA-LAD; SEQ SVG-OM1-OM2; SVG-PL  . CORONARY STENT INTERVENTION N/A 08/05/2016   Procedure: Coronary Stent Intervention;  Surgeon: Isaias Cowman, MD;  Location: [place] CV LAB;  Service: Cardiovascular;  Laterality: N/A;  . LEFT HEART CATH AND CORONARY ANGIOGRAPHY N/A 08/05/2016   Procedure: Left Heart Cath and Coronary Angiography;  Surgeon: Saralyn Pilar,  Sheppard Coil, MD;  Location: [place] CV LAB;  Service: Cardiovascular;  Laterality: N/A;  . TEE WITHOUT CARDIOVERSION N/A 11/10/2015   Procedure: TRANSESOPHAGEAL ECHOCARDIOGRAM (TEE);  Surgeon: [name], MD;  Location: [place];  Service: Open Heart Surgery;  Laterality: N/A;  . TUBAL LIGATION       OB History    Gravida  5   Para  5   Term  3   Preterm  2   AB      Living        SAB      TAB      Ectopic      Multiple      Live Births  2            Home Medications    Prior to Admission medications     Medication Sig Start Date End Date Taking? Authorizing Provider  acetaminophen-codeine (TYLENOL #3) 300-30 MG tablet Take 1-2 tablets by mouth every 6 (six) hours as needed for moderate pain. 06/22/17  Yes Rubie Maid, MD  aspirin 81 MG chewable tablet Chew 1 tablet (81 mg total) by mouth daily. 10/11/16  Yes [name]., MD  clopidogrel (PLAVIX) 75 MG tablet Take 75 mg by mouth daily.   Yes [provider]  Coenzyme Q10 (CO Q 10) 100 MG CAPS Take 100 mg by mouth daily. 04/25/17  Yes [name]., MD  diazepam (VALIUM) 2 MG tablet Take 1 tablet by mouth 2 (two) times daily as needed. 09/20/16  Yes [provider]  hydrochlorothiazide (MICROZIDE) 12.5 MG capsule Take 12.5 mg by mouth daily.   Yes [provider]  isosorbide mononitrate (IMDUR) 30 MG 24 hr tablet Take 2 tablets (60 mg total) by mouth daily. Patient taking differently: Take 30 mg by mouth 2 (two) times daily.  11/04/16  Yes Plonk, Gwyndolyn Saxon, MD  loperamide (IMODIUM) 2 MG capsule Take by mouth as needed for diarrhea or loose stools.   Yes [provider]  metFORMIN (GLUCOPHAGE) 1000 MG tablet Take 1 tablet (1,000 mg total) by mouth 2 (two) times daily with a meal. 11/04/16  Yes Plonk, William, MD  metoprolol tartrate (LOPRESSOR) 50 MG tablet TAKE 1 TABLET BY MOUTH TWICE A DAY FOR HIGH BLOOD PRESSURE 02/24/17  Yes [name]., MD  nitroGLYCERIN (NITROSTAT) 0.4 MG SL tablet Place 1 tablet (0.4 mg total) under the tongue every 5 (five) minutes as needed for chest pain. 07/27/16  Yes Mody, Sital, MD  ondansetron (ZOFRAN-ODT) 8 MG disintegrating tablet DISSOLVE 1 TABLET ON THE TONGUE EVERY 8 HOURS AS NEEDED FOR NAUSEA OR VOMITING 05/03/17  Yes [name]., MD  rosuvastatin (CRESTOR) 10 MG tablet Take 1 tablet (10 mg total) by mouth daily. 03/24/17  Yes [name]., MD  tiZANidine (ZANAFLEX) 2 MG tablet Take 2 mg by mouth at bedtime.    Yes [provider]     Family History Family History  Problem Relation Age of Onset  . Cancer Father   . Hypertension Father   . Heart disease Father   . Cancer Mother   . Hypertension Mother   . Cancer Sister   . Breast cancer Sister 28  . Breast cancer Sister 8  . Lung cancer Brother   . Pancreatic cancer Brother     Social History Social History   Tobacco Use  . Smoking status: Former Smoker    Packs/day: 0.50    Types: Cigarettes  Last attempt to quit: 10/07/2001    Years since quitting: 15.8  . Smokeless tobacco: Never Used  Substance Use Topics  . Alcohol use: No    Alcohol/week: 0.0 oz  . Drug use: No     Allergies   Lisinopril and Penicillins   Review of Systems Review of Systems  All systems reviewed and negative, other than as noted in HPI.  Physical Exam Updated Vital Signs BP 120/63   Pulse 81   Temp 98 F (36.7 C) (Oral)   Resp 16   Ht 5\' 2"  (1.575 m)   Wt 78.5 kg (173 lb)   SpO2 97%   BMI 31.64 kg/m   Physical Exam  Constitutional: She appears well-developed and well-nourished. No distress.  HENT:  Head: Normocephalic and atraumatic.  Eyes: Conjunctivae are normal. Right eye exhibits no discharge. Left eye exhibits no discharge.  Neck: Neck supple.  Cardiovascular: Normal rate, regular rhythm and normal heart sounds. Exam reveals no gallop and no friction rub.  No murmur heard. Pulmonary/Chest: Effort normal and breath sounds normal. No respiratory distress.  Abdominal: Soft. She exhibits no distension. There is no tenderness.  Musculoskeletal: She exhibits no edema or tenderness.  Neurological: She is alert.  Skin: Skin is warm and dry.  Psychiatric: She has a normal mood and affect. Her behavior is normal. Thought content normal.  Nursing note and vitals reviewed.    ED Treatments / Results  Labs (all labs ordered are listed, but only abnormal results are displayed) Labs Reviewed  CBC WITH DIFFERENTIAL/PLATELET - Abnormal; Notable for the  following components:      Result Value   RBC 3.44 (*)    Hemoglobin 10.8 (*)    HCT 32.0 (*)    All other components within normal limits  BASIC METABOLIC PANEL - Abnormal; Notable for the following components:   Glucose, Bld 277 (*)    All other components within normal limits  TROPONIN I    EKG EKG Interpretation  Date/Time:  Tuesday July 26 2017 21:48:49 EDT Ventricular Rate:  87 PR Interval:    QRS Duration: 97 QT Interval:  370 QTC Calculation: 446 R Axis:   53 Text Interpretation:  Sinus rhythm Confirmed by Virgel Manifold [phone]) on 07/26/2017 10:03:58 PM   Radiology Dg Chest 2 View  Result Date: 07/26/2017 CLINICAL DATA:  Chest pain starting around 11 30 this morning. Radiates to the left arm, abdomen, and back. Diaphoresis and nausea. Previous history of heart disease. EXAM: CHEST - 2 VIEW COMPARISON:  12/23/2016 FINDINGS: Postoperative changes in the mediastinum. Shallow inspiration. Heart size and pulmonary vascularity are normal. Lungs appear clear and expanded. No blunting of costophrenic angles. No pneumothorax. Mediastinal contours appear intact. Mild degenerative changes in the spine and shoulders. Calcification of the aorta. IMPRESSION: No evidence of active pulmonary disease.  Aortic atherosclerosis. Electronically Signed   By: Lucienne Capers M.D.   On: 07/26/2017 22:52    Procedures Procedures (including critical care time)  Medications Ordered in ED Medications - No data to display   Initial Impression / Assessment and Plan / ED Course  I have reviewed the triage vital signs and the nursing notes.  Pertinent labs & imaging results that were available during my care of the patient were reviewed by me and considered in my medical decision making (see chart for details).     67 year old female chest pain.  She has known CAD with frequent pain.  Today was worse than normal.  Her EKG does  not appear acutely changed.  She is currently symptom-free.  Normal  troponin more than 8 hours after symptom onset.  This is a preferred discharge at this time.  She has established cardiology care.  Return precautions were discussed.  I doubt PE, dissection or other emergent process.  Final Clinical Impressions(s) / ED Diagnoses   Final diagnoses:  Chest pain, unspecified type    ED Discharge Orders    None       Virgel Manifold, MD 07/28/17 2304

## 2017-07-28 ENCOUNTER — Encounter: Payer: Medicare Other | Attending: Internal Medicine

## 2017-07-28 DIAGNOSIS — I25118 Atherosclerotic heart disease of native coronary artery with other forms of angina pectoris: Secondary | ICD-10-CM | POA: Diagnosis not present

## 2017-07-28 DIAGNOSIS — Z7984 Long term (current) use of oral hypoglycemic drugs: Secondary | ICD-10-CM | POA: Diagnosis not present

## 2017-07-28 DIAGNOSIS — Z791 Long term (current) use of non-steroidal anti-inflammatories (NSAID): Secondary | ICD-10-CM | POA: Diagnosis not present

## 2017-07-28 DIAGNOSIS — Z8673 Personal history of transient ischemic attack (TIA), and cerebral infarction without residual deficits: Secondary | ICD-10-CM | POA: Diagnosis not present

## 2017-07-28 DIAGNOSIS — F419 Anxiety disorder, unspecified: Secondary | ICD-10-CM | POA: Insufficient documentation

## 2017-07-28 DIAGNOSIS — I208 Other forms of angina pectoris: Secondary | ICD-10-CM | POA: Diagnosis present

## 2017-07-28 DIAGNOSIS — K219 Gastro-esophageal reflux disease without esophagitis: Secondary | ICD-10-CM | POA: Diagnosis not present

## 2017-07-28 DIAGNOSIS — Z9861 Coronary angioplasty status: Secondary | ICD-10-CM

## 2017-07-28 DIAGNOSIS — Z7982 Long term (current) use of aspirin: Secondary | ICD-10-CM | POA: Diagnosis not present

## 2017-07-28 DIAGNOSIS — E785 Hyperlipidemia, unspecified: Secondary | ICD-10-CM | POA: Insufficient documentation

## 2017-07-28 DIAGNOSIS — I10 Essential (primary) hypertension: Secondary | ICD-10-CM | POA: Insufficient documentation

## 2017-07-28 DIAGNOSIS — Z79899 Other long term (current) drug therapy: Secondary | ICD-10-CM | POA: Diagnosis not present

## 2017-07-28 DIAGNOSIS — I252 Old myocardial infarction: Secondary | ICD-10-CM | POA: Diagnosis not present

## 2017-07-28 DIAGNOSIS — Z87891 Personal history of nicotine dependence: Secondary | ICD-10-CM | POA: Diagnosis not present

## 2017-07-28 DIAGNOSIS — E119 Type 2 diabetes mellitus without complications: Secondary | ICD-10-CM | POA: Diagnosis not present

## 2017-07-28 DIAGNOSIS — Z951 Presence of aortocoronary bypass graft: Secondary | ICD-10-CM

## 2017-07-28 DIAGNOSIS — F329 Major depressive disorder, single episode, unspecified: Secondary | ICD-10-CM | POA: Insufficient documentation

## 2017-07-28 NOTE — Progress Notes (Signed)
Daily Session Note  Patient Details  Name: Candace [name] MRN: [id number] Date of Birth: [birth year] Referring Provider:     Cardiac Rehab from 06/02/2017 in [facility] Cardiac and Pulmonary Rehab  Referring Provider  Rosanna Randy      Encounter Date: 07/28/2017  Check In: Session Check In - 07/28/17 0838      Check-In   Location  [facility]-Cardiac & Pulmonary Rehab    Staff Present  Alberteen Sam, MA, RCEP, CCRP, Exercise Physiologist;Drina Jobst Oletta Darter, BA, ACSM CEP, Exercise Physiologist;Carroll Enterkin, RN, BSN    Supervising physician immediately available to respond to emergencies  See telemetry face sheet for immediately available ER MD    Medication changes reported      No    Fall or balance concerns reported     No    Warm-up and Cool-down  Performed on first and last piece of equipment    Resistance Training Performed  Yes    VAD Patient?  No      Pain Assessment   Currently in Pain?  No/denies    Multiple Pain Sites  No          Social History   Tobacco Use  Smoking Status Former Smoker  . Packs/day: 0.50  . Types: Cigarettes  . Last attempt to quit: 10/07/2001  . Years since quitting: 15.8  Smokeless Tobacco Never Used    Goals Met:  Independence with exercise equipment Exercise tolerated well Personal goals reviewed No report of cardiac concerns or symptoms Strength training completed today  Goals Unmet:  Not Applicable  Comments: Pt able to follow exercise prescription today without complaint.  Will continue to monitor for progression. See ITP for goal review   Dr. Emily Filbert is Medical Director for [facility] and [facility].

## 2017-08-01 ENCOUNTER — Ambulatory Visit (INDEPENDENT_AMBULATORY_CARE_PROVIDER_SITE_OTHER): Payer: Medicare Other | Admitting: Pulmonary Disease

## 2017-08-01 ENCOUNTER — Encounter: Payer: Self-pay | Admitting: Pulmonary Disease

## 2017-08-01 DIAGNOSIS — J4520 Mild intermittent asthma, uncomplicated: Secondary | ICD-10-CM | POA: Diagnosis not present

## 2017-08-01 DIAGNOSIS — R911 Solitary pulmonary nodule: Secondary | ICD-10-CM | POA: Diagnosis not present

## 2017-08-01 DIAGNOSIS — G4733 Obstructive sleep apnea (adult) (pediatric): Secondary | ICD-10-CM | POA: Diagnosis not present

## 2017-08-01 MED ORDER — ALBUTEROL SULFATE HFA 108 (90 BASE) MCG/ACT IN AERS: 2.0000 | INHALATION_SPRAY | Freq: Four times a day (QID) | RESPIRATORY_TRACT | 2 refills | Status: DC | PRN

## 2017-08-01 NOTE — Progress Notes (Signed)
   Subjective:    Patient ID: Candace [name], female    DOB: 12/17/1950, 67 y.o.   MRN: [id number]  HPI  67 yo remote smoker for FU of mild OSA and intermittent wheezing  S/p urgentCABG 11/10/15 for 3V -CAD - Had recurrent chest pain-and eventually needed stent to bypass graft.  She developed bronchospasm postoperatively that responded well to steroids.She was a remote smoker,less than half pack per day-less than 20 pack years.  Accompanied by daughter today She continues to have intermittent shortness of breath.  This has been attributed to cardiac reason. She was hospitalized 04/2017 at [facility] for abdominal pain, CT abdomen showed fibroids but surgery has been deferred awaiting pulmonary and cardiac clearance.  CT abdomen also picked up incidentally left lower lobe 2 mm nodule   -Chronic headaches, was treated with high-dose steroids for temporal arteritis, biopsy was negative, has seen neurologist at [facility], imaging negative, not being treated with Zanaflex increasing doses.  She had sleep onset insomnia and use melatonin one time together with Zanaflex which gave her bad dreams and she is hesitant to use any more of this    Significant tests/ events reviewed   CT chest 04/2014 shows no evidence of emphysema or fibrosis Spirometry 01/2016 FEV1 85%,Normal ratio,13% improvement with bronchodilator to 97% 03/2016 PSG >> AHI 10/h, PLMs ++ , PLM arousal 6/h   Review of Systems Patient denies significant dyspnea,cough, hemoptysis,  chest pain, palpitations, pedal edema, orthopnea, paroxysmal nocturnal dyspnea, lightheadedness, nausea, vomiting, abdominal or  leg pains      Objective:   Physical Exam  Gen. Pleasant, obese, in no distress ENT - no lesions, no post nasal drip Neck: No JVD, no thyromegaly, no carotid bruits Lungs: no use of accessory muscles, no dullness to percussion, decreased without rales or rhonchi  Cardiovascular: Rhythm  regular, heart sounds  normal, no murmurs or gallops, no peripheral edema Musculoskeletal: No deformities, no cyanosis or clubbing , no tremors       Assessment & Plan:

## 2017-08-01 NOTE — Patient Instructions (Signed)
Prescription for albuterol MDI 2 puffs every 6 hours as needed for wheezing. Clearance letter will be sent to your GYN doctor.  CT chest without contrast to follow-up on left lower lobe nodule will be scheduled for August 2019

## 2017-08-01 NOTE — Assessment & Plan Note (Signed)
Incidental finding of 2 mm left lower lobe nodule, likely benign statistically in this remote smoker, not seen in 2016. We will obtain six-month follow-up CT scan

## 2017-08-01 NOTE — Assessment & Plan Note (Signed)
She chose to defer treatment with CPAP and would rather work on weight loss. No contraindication to surgery

## 2017-08-01 NOTE — Assessment & Plan Note (Signed)
Use albuterol for wheezing as needed.  No need for maintenance medications at this time

## 2017-08-02 DIAGNOSIS — Z951 Presence of aortocoronary bypass graft: Secondary | ICD-10-CM

## 2017-08-02 DIAGNOSIS — Z9861 Coronary angioplasty status: Secondary | ICD-10-CM

## 2017-08-02 DIAGNOSIS — I208 Other forms of angina pectoris: Secondary | ICD-10-CM

## 2017-08-02 DIAGNOSIS — I25118 Atherosclerotic heart disease of native coronary artery with other forms of angina pectoris: Secondary | ICD-10-CM | POA: Diagnosis not present

## 2017-08-02 NOTE — Progress Notes (Signed)
Daily Session Note  Patient Details  Name: Candace [name] MRN: [id number] Date of Birth: August 28, 1950 Referring Provider:     Cardiac Rehab from 06/02/2017 in [facility] Cardiac and Pulmonary Rehab  Referring Provider  Rosanna Randy      Encounter Date: 08/02/2017  Check In: Session Check In - 08/02/17 0831      Check-In   Location  [facility]-Cardiac & Pulmonary Rehab    Staff Present  Alberteen Sam, MA, RCEP, CCRP, Exercise Physiologist;Amanda Oletta Darter, BA, ACSM CEP, Exercise Physiologist;Susanne Bice, RN, BSN, CCRP    Supervising physician immediately available to respond to emergencies  See telemetry face sheet for immediately available ER MD    Medication changes reported      No    Fall or balance concerns reported     No    Warm-up and Cool-down  Performed on first and last piece of equipment    Resistance Training Performed  Yes    VAD Patient?  No      Pain Assessment   Currently in Pain?  No/denies    Multiple Pain Sites  No          Social History   Tobacco Use  Smoking Status Former Smoker  . Packs/day: 0.50  . Types: Cigarettes  . Last attempt to quit: 10/07/2001  . Years since quitting: 15.8  Smokeless Tobacco Never Used    Goals Met:  Independence with exercise equipment Exercise tolerated well No report of cardiac concerns or symptoms Strength training completed today  Goals Unmet:  Not Applicable  Comments: Pt able to follow exercise prescription today without complaint.  Will continue to monitor for progression.    Dr. Emily Filbert is Medical Director for [facility] and [facility].

## 2017-08-04 ENCOUNTER — Encounter: Payer: Medicare Other | Admitting: *Deleted

## 2017-08-04 VITALS — Ht 62.75 in | Wt 174.0 lb

## 2017-08-04 DIAGNOSIS — I25118 Atherosclerotic heart disease of native coronary artery with other forms of angina pectoris: Secondary | ICD-10-CM | POA: Diagnosis not present

## 2017-08-04 DIAGNOSIS — I208 Other forms of angina pectoris: Secondary | ICD-10-CM

## 2017-08-04 NOTE — Progress Notes (Signed)
Daily Session Note  Patient Details  Name: Candace [name] MRN: [id number] Date of Birth: 07/15/1950 Referring Provider:     Cardiac Rehab from 06/02/2017 in [facility] Cardiac and Pulmonary Rehab  Referring Provider  [place]      Encounter Date: 08/04/2017  Check In: Session Check In - 08/04/17 0826      Check-In   Location  [facility]-Cardiac & Pulmonary Rehab    Staff Present   , MA, RCEP, CCRP, Exercise Physiologist;Amanda Sommer, BA, ACSM CEP, Exercise Physiologist;Carroll Enterkin, RN, BSN    Supervising physician immediately available to respond to emergencies  See telemetry face sheet for immediately available ER MD    Medication changes reported      No    Fall or balance concerns reported     No    Warm-up and Cool-down  Performed on first and last piece of equipment    Resistance Training Performed  Yes    VAD Patient?  No      Pain Assessment   Currently in Pain?  No/denies          Social History   Tobacco Use  Smoking Status Former Smoker  . Packs/day: 0.50  . Types: Cigarettes  . Last attempt to quit: 10/07/2001  . Years since quitting: 15.8  Smokeless Tobacco Never Used    Goals Met:  Independence with exercise equipment Exercise tolerated well No report of cardiac concerns or symptoms Strength training completed today  Goals Unmet:  Not Applicable  Comments: Pt able to follow exercise prescription today without complaint.  Will continue to monitor for progression. 6 Minute Walk    Row Name 06/02/17 1427 08/04/17 0943       6 Minute Walk   Phase  -  Discharge    Distance  942 feet  1224 feet    Distance % Change  -  30 %    Distance Feet Change  -  283 ft    Walk Time  6 minutes  6 minutes    # of Rest Breaks  0  0    MPH  1.78  2.32    METS  3.14  3.6    RPE  15  11    Perceived Dyspnea   2  1    VO2 Peak  10.99  12.6    Symptoms  Yes (comment)  Yes (comment)    Comments  legs fatigued  calves tight 3/10 (claudication)    Resting  HR  84 bpm  51 bpm    Resting BP  120/80  124/76    Resting Oxygen Saturation   96 %  -    Exercise Oxygen Saturation  during 6 min walk  99 %  -    Max Ex. HR  128 bpm  122 bpm    Max Ex. BP  192/104  198/82    2 Minute Post BP  148/74  -         Dr. Mark Miller is Medical Director for [facility] and [facility]. 

## 2017-08-10 ENCOUNTER — Other Ambulatory Visit: Payer: Self-pay

## 2017-08-10 ENCOUNTER — Encounter
Admission: RE | Admit: 2017-08-10 | Discharge: 2017-08-10 | Disposition: A | Discharge status: Home / Self Care | Payer: Medicare Other | Source: Ambulatory Visit | Attending: Obstetrics and Gynecology | Admitting: Obstetrics and Gynecology

## 2017-08-10 ENCOUNTER — Encounter: Payer: Self-pay | Admitting: Obstetrics and Gynecology

## 2017-08-10 ENCOUNTER — Ambulatory Visit (INDEPENDENT_AMBULATORY_CARE_PROVIDER_SITE_OTHER): Payer: Medicare Other | Admitting: Obstetrics and Gynecology

## 2017-08-10 VITALS — BP 98/66 | HR 90 | Wt 172.7 lb

## 2017-08-10 DIAGNOSIS — G4730 Sleep apnea, unspecified: Secondary | ICD-10-CM

## 2017-08-10 DIAGNOSIS — Z01812 Encounter for preprocedural laboratory examination: Secondary | ICD-10-CM | POA: Diagnosis present

## 2017-08-10 DIAGNOSIS — I25110 Atherosclerotic heart disease of native coronary artery with unstable angina pectoris: Secondary | ICD-10-CM

## 2017-08-10 DIAGNOSIS — D250 Submucous leiomyoma of uterus: Secondary | ICD-10-CM

## 2017-08-10 DIAGNOSIS — R102 Pelvic and perineal pain: Secondary | ICD-10-CM

## 2017-08-10 DIAGNOSIS — Z01818 Encounter for other preprocedural examination: Secondary | ICD-10-CM

## 2017-08-10 DIAGNOSIS — E119 Type 2 diabetes mellitus without complications: Secondary | ICD-10-CM

## 2017-08-10 DIAGNOSIS — J4520 Mild intermittent asthma, uncomplicated: Secondary | ICD-10-CM

## 2017-08-10 HISTORY — DX: Personal history of other diseases of the digestive system: Z87.19

## 2017-08-10 HISTORY — DX: Pneumonia, unspecified organism: J18.9

## 2017-08-10 LAB — TYPE AND SCREEN
ABO/RH(D): O POS
Antibody Screen: NEGATIVE

## 2017-08-10 LAB — COMPREHENSIVE METABOLIC PANEL
ALT: 11 U/L — ABNORMAL LOW (ref 14–54)
AST: 20 U/L (ref 15–41)
Albumin: 4.4 g/dL (ref 3.5–5.0)
Alkaline Phosphatase: 89 U/L (ref 38–126)
Anion gap: 12 (ref 5–15)
BUN: 19 mg/dL (ref 6–20)
CO2: 27 mmol/L (ref 22–32)
Calcium: 9.7 mg/dL (ref 8.9–10.3)
Chloride: 101 mmol/L (ref 101–111)
Creatinine, Ser: 0.79 mg/dL (ref 0.44–1.00)
GFR calc Af Amer: >60 mL/min (ref >60)
GFR calc non Af Amer: >60 mL/min (ref >60)
Glucose, Bld: 206 mg/dL — ABNORMAL HIGH (ref 65–99)
Potassium: 3.3 mmol/L — ABNORMAL LOW (ref 3.5–5.1)
Sodium: 140 mmol/L (ref 135–145)
Total Bilirubin: 0.5 mg/dL (ref 0.3–1.2)
Total Protein: 7.9 g/dL (ref 6.5–8.1)

## 2017-08-10 LAB — CBC
HCT: 38.2 % (ref 35.0–47.0)
Hemoglobin: 13.1 g/dL (ref 12.0–16.0)
MCH: 32 pg (ref 26.0–34.0)
MCHC: 34.3 g/dL (ref 32.0–36.0)
MCV: 93.3 fL (ref 80.0–100.0)
Platelets: 240 10*3/uL (ref 150–440)
RBC: 4.09 MIL/uL (ref 3.80–5.20)
RDW: 13.7 % (ref 11.5–14.5)
WBC: 6.2 10*3/uL (ref 3.6–11.0)

## 2017-08-10 MED ORDER — ACETAMINOPHEN-CODEINE #3 300-30 MG PO TABS: 1.0000 | ORAL_TABLET | Freq: Four times a day (QID) | ORAL | 0 refills | Status: DC | PRN

## 2017-08-10 NOTE — Patient Instructions (Signed)
Your procedure is scheduled on: Monday, Aug 15, 2017 Report to Day Surgery on the 2nd floor of the [facility]. To find out your arrival time, please call [phone] between 1PM - 3PM on: Friday, Aug 12, 2017  REMEMBER: Instructions that are not followed completely may result in serious medical risk, up to and including death; or upon the discretion of your surgeon and anesthesiologist your surgery may need to be rescheduled.  Do not eat food after midnight the night before your procedure.  No gum chewing, lozengers or hard candies.  You may however, drink water up to 2 hours before you are scheduled to arrive for your surgery. Do not drink anything within 2 hours of the start of your surgery.  No Alcohol for 24 hours before or after surgery.  No Smoking including e-cigarettes for 24 hours prior to surgery.  No chewable tobacco products for at least 6 hours prior to surgery.  No nicotine patches on the day of surgery.  On the morning of surgery brush your teeth with toothpaste and water, you may rinse your mouth with mouthwash if you wish. Do not swallow any toothpaste or mouthwash.  Notify your doctor if there is any change in your medical condition (cold, fever, infection).  Do not wear jewelry, make-up, hairpins, clips or nail polish.  Do not wear lotions, powders, or perfumes. You may wear deodorant.  Do not shave 48 hours prior to surgery.   Contacts and dentures may not be worn into surgery.  Do not bring valuables to the hospital, including drivers license, insurance or credit cards.  [facility] is not responsible for any belongings or valuables.   TAKE THESE MEDICATIONS THE MORNING OF SURGERY:  1.  Albuterol inhaler 2.  Isosorbide Mononitrate 3.  Metoprolol  Use CHG Soap as directed on instruction sheet.  Use inhalers on the day of surgery and bring to the hospital.  Stop Metformin 2 days prior to surgery. Last day to take is Friday, Aug 12, 2017. Resume  after surgery.  Follow recommendations from Cardiologist, Pulmonologist or PCP regarding stopping Aspirin and Plavix. Stop 4 days prior. Last day is today, May 15th.   NOW!  Stop Anti-inflammatories (NSAIDS) such as Advil, Aleve, Ibuprofen, Motrin, Naproxen, Naprosyn and Aspirin based products such as Excedrin, Goodys Powder, BC Powder. (May take Tylenol or Acetaminophen if needed.)  NOW!  Stop ANY OVER THE COUNTER supplements until after surgery. (COENZYME)  Wear comfortable clothing (specific to your surgery type) to the hospital.  Plan for stool softeners for home use.  If you are being admitted to the hospital overnight, leave your suitcase in the car. After surgery it may be brought to your room.  Please call [phone] if you have any questions about these instructions.

## 2017-08-10 NOTE — Progress Notes (Signed)
Pt is present today to discuss surgery. 

## 2017-08-11 ENCOUNTER — Encounter: Payer: Self-pay | Admitting: Obstetrics and Gynecology

## 2017-08-11 ENCOUNTER — Encounter: Payer: Self-pay | Admitting: *Deleted

## 2017-08-11 ENCOUNTER — Telehealth: Payer: Self-pay | Admitting: *Deleted

## 2017-08-11 DIAGNOSIS — I208 Other forms of angina pectoris: Secondary | ICD-10-CM

## 2017-08-11 NOTE — H&P (Signed)
GYNECOLOGY PREOPERATIVE HISTORY AND PHYSICAL   Subjective:  Candace [name] is a 67 y.o. [id number] here for surgical management of pelvic pressure and pain.   Indications for procedure include: enlarged fibroid uterus. Significant preoperative concerns include: CAD with h/o CABG in 2017 with recent coronary artery stent placement in 04/2017, paroxysmal afib, mild obstructive sleep apnea, DM, asthma, and sleep apnea.     Proposed surgery: Total Abdominal Hysterectomy with Bilateral Salpingo-oophorectomy    Pertinent Gynecological History: Menses: post-menopausal Last mammogram: normal Date: 06/12/2015 Last pap: normal Date: 02/2017 Last colonoscopy: normal.  Date: 09/2013.     Past Medical History:  Diagnosis Date  . Allergy   . Anemia   . Anxiety   . Arrhythmia   . Arthritis   . Coronary artery disease   . Depression   . Diabetes mellitus without complication ([place])   . Dyspnea    doe  . Dysrhythmia   . GERD (gastroesophageal reflux disease)   . Headache   . History of hiatal hernia   . Hyperlipidemia   . Hypertension   . Myocardial infarction ([place])    2016, 04/2017  . Myocardial infarction with cardiac rehabilitation [place])    MI 2016/ CABG 8/17    FINISHED CARDIAC REHAB 3 WEEKS AGO  . Panic attack   . Pneumonia   . Reflux   . Stroke [facility]) 2015   showed up on MRI; no weakness noted  . Voice tremor     Past Surgical History:  Procedure Laterality Date  . APPENDECTOMY    . ARTERY BIOPSY Right 04/26/2016   Procedure: BIOPSY TEMPORAL ARTERY;  Surgeon: Margaretha Sheffield, MD;  Location: [facility] OR[facility];  Service: ENT;  Laterality: Right;  . CARDIAC CATHETERIZATION N/A 11/06/2015   Procedure: Left Heart Cath and Coronary Angiography;  Surgeon: Corey Skains, MD;  Location: [place] [facility];  Service: Cardiovascular;  Laterality: N/A;  . CESAREAN SECTION    . CORONARY ANGIOPLASTY  04/2017   [place]  . CORONARY ARTERY BYPASS GRAFT N/A 11/10/2015   Procedure:  CORONARY ARTERY BYPASS GRAFTING (CABG), ON PUMP, TIMES FOUR, USING LEFT INTERNAL MAMMARY ARTERY, BILATERAL GREATER SAPHENOUS VEINS HARVESTED ENDOSCOPICALLY;  Surgeon: [name], MD;  Location: [place];  Service: Open Heart Surgery;  Laterality: N/A;  LIMA-LAD; SEQ SVG-OM1-OM2; SVG-PL  . CORONARY STENT INTERVENTION N/A 08/05/2016   Procedure: Coronary Stent Intervention;  Surgeon: Isaias Cowman, MD;  Location: [place] [facility];  Service: Cardiovascular;  Laterality: N/A;  . LEFT HEART CATH AND CORONARY ANGIOGRAPHY N/A 08/05/2016   Procedure: Left Heart Cath and Coronary Angiography;  Surgeon: Isaias Cowman, MD;  Location: [place] [facility];  Service: Cardiovascular;  Laterality: N/A;  . TEE WITHOUT CARDIOVERSION N/A 11/10/2015   Procedure: TRANSESOPHAGEAL ECHOCARDIOGRAM (TEE);  Surgeon: [name], MD;  Location: [place];  Service: Open Heart Surgery;  Laterality: N/A;  . TUBAL LIGATION      OB History  Gravida Para Term Preterm AB Living  5 5 3 2       SAB TAB Ectopic Multiple Live Births          2    # Outcome Date GA Lbr Len/2nd Weight Sex Delivery Anes PTL Lv  5 Term 68    M CS-Unspec     4 Term 26    F CS-Unspec     3 Preterm 65    M    ND  2 Preterm 1975    M  ND  1 Term 83    F Vag-Breech       Family History  Problem Relation Age of Onset  . Cancer Father   . Hypertension Father   . Heart disease Father   . Asthma Father   . Cancer Mother   . Hypertension Mother   . Pancreatic cancer Mother   . Cancer Sister   . Breast cancer Sister 88  . Breast cancer Sister 61  . Lung cancer Brother   . Pancreatic cancer Sister   . Cancer Sister    Social History   Socioeconomic History  . Marital status: Divorced    Spouse name: Not on file  . Number of children: Not on file  . Years of education: Not on file  . Highest education level: Not on file  Occupational History  . Not on file  Social Needs  . Financial resource strain: Not on  file  . Food insecurity:    Worry: Not on file    Inability: Not on file  . Transportation needs:    Medical: Not on file    Non-medical: Not on file  Tobacco Use  . Smoking status: Former Smoker    Packs/day: 0.50    Types: Cigarettes    Last attempt to quit: 10/07/2001    Years since quitting: 15.8  . Smokeless tobacco: Never Used  Substance and Sexual Activity  . Alcohol use: No    Alcohol/week: 0.0 oz  . Drug use: No  . Sexual activity: Not Currently  Lifestyle  . Physical activity:    Days per week: Not on file    Minutes per session: Not on file  . Stress: Not on file  Relationships  . Social connections:    Talks on phone: Not on file    Gets together: Not on file    Attends religious service: Not on file    Active member of club or organization: Not on file    Attends meetings of clubs or organizations: Not on file    Relationship status: Not on file  . Intimate partner violence:    Fear of current or ex partner: Not on file    Emotionally abused: Not on file    Physically abused: Not on file    Forced sexual activity: Not on file  Other Topics Concern  . Not on file  Social History Narrative  . Not on file   Current Outpatient Medications on File Prior to Visit  Medication Sig Dispense Refill  . albuterol (PROVENTIL HFA;VENTOLIN HFA) 108 (90 Base) MCG/ACT inhaler Inhale 2 puffs into the lungs every 6 (six) hours as needed for wheezing or shortness of breath. 1 Inhaler 2  . aspirin 81 MG chewable tablet Chew 1 tablet (81 mg total) by mouth daily. 90 tablet 3  . clopidogrel (PLAVIX) 75 MG tablet Take 75 mg by mouth daily.    . Coenzyme Q10 (CO Q 10) 100 MG CAPS Take 100 mg by mouth daily. 30 capsule 12  . hydrochlorothiazide (MICROZIDE) 12.5 MG capsule Take 12.5 mg by mouth daily.    . isosorbide mononitrate (IMDUR) 30 MG 24 hr tablet Take 2 tablets (60 mg total) by mouth daily. (Patient taking differently: Take 30 mg by mouth 2 (two) times daily. ) 60 tablet 12   . loperamide (IMODIUM) 2 MG capsule Take by mouth as needed for diarrhea or loose stools.    . metFORMIN (GLUCOPHAGE) 1000 MG tablet Take 1 tablet (1,000 mg total)  by mouth 2 (two) times daily with a meal. 180 tablet 3  . metoprolol tartrate (LOPRESSOR) 50 MG tablet TAKE 1 TABLET BY MOUTH TWICE A DAY FOR HIGH BLOOD PRESSURE 60 tablet 12  . nitroGLYCERIN (NITROSTAT) 0.4 MG SL tablet Place 1 tablet (0.4 mg total) under the tongue every 5 (five) minutes as needed for chest pain. 30 tablet 0  . ondansetron (ZOFRAN-ODT) 8 MG disintegrating tablet DISSOLVE 1 TABLET ON THE TONGUE EVERY 8 HOURS AS NEEDED FOR NAUSEA OR VOMITING 30 tablet 3  . rosuvastatin (CRESTOR) 10 MG tablet Take 1 tablet (10 mg total) by mouth daily. 90 tablet 3  . tiZANidine (ZANAFLEX) 2 MG tablet Take 2 mg by mouth at bedtime.      No current facility-administered medications on file prior to visit.    Allergies  Allergen Reactions  . Lisinopril     cough  . Penicillins Swelling, Rash and Other (See Comments)    Has patient had a PCN reaction causing immediate rash, facial/tongue/throat swelling, SOB or lightheadedness with hypotension: Yes Has patient had a PCN reaction causing severe rash involving mucus membranes or skin necrosis: Yes Has patient had a PCN reaction that required hospitalization No Has patient had a PCN reaction occurring within the last 10 years: Yes If all of the above answers are "NO", then may proceed with Cephalosporin use.      Review of Systems Constitutional: No recent fever/chills/sweats Respiratory: No recent cough/bronchitis Cardiovascular: No chest pain Gastrointestinal: No recent nausea/vomiting/diarrhea Genitourinary: No UTI symptoms Hematologic/lymphatic:No history of coagulopathy or recent blood thinner use    Objective:   Blood pressure 98/66, pulse 90, weight 172 lb 11.2 oz (78.3 kg). Body mass index is 30.84 kg/m.  CONSTITUTIONAL: Well-developed, well-nourished female in no  acute distress.  HENT:  Normocephalic, atraumatic, External right and left ear normal. Oropharynx is clear and moist EYES: Conjunctivae and EOM are normal. Pupils are equal, round, and reactive to light. No scleral icterus.  NECK: Normal range of motion, supple, no masses SKIN: Skin is warm and dry. No rash noted. Not diaphoretic. No erythema. No pallor. NEUROLOGIC: Alert and oriented to person, place, and time. Normal reflexes, muscle tone coordination. No cranial nerve deficit noted. PSYCHIATRIC: Normal mood and affect. Normal behavior. Normal judgment and thought content. CARDIOVASCULAR: Normal heart rate noted, regular rhythm RESPIRATORY: Effort and breath sounds normal, no problems with respiration noted ABDOMEN: Soft, nontender, nondistended.  Well-healed infraumbilical midline scar.  PELVIC: Deferred MUSCULOSKELETAL: Normal range of motion. No edema and no tenderness. 2+ distal pulses.    Labs: Results for orders placed or performed during the hospital encounter of 08/10/17 (from the past 336 hour(s))  CBC   Collection Time: 08/10/17 12:21 PM  Result Value Ref Range   WBC 6.2 3.6 - 11.0 K/uL   RBC 4.09 3.80 - 5.20 MIL/uL   Hemoglobin 13.1 12.0 - 16.0 g/dL   HCT 38.2 35.0 - 47.0 %   MCV 93.3 80.0 - 100.0 fL   MCH 32.0 26.0 - 34.0 pg   MCHC 34.3 32.0 - 36.0 g/dL   RDW 13.7 11.5 - 14.5 %   Platelets 240 150 - 440 K/uL  Comprehensive metabolic panel   Collection Time: 08/10/17 12:21 PM  Result Value Ref Range   Sodium 140 135 - 145 mmol/L   Potassium 3.3 (L) 3.5 - 5.1 mmol/L   Chloride 101 101 - 111 mmol/L   CO2 27 22 - 32 mmol/L   Glucose, Bld 206 (H) 65 -  99 mg/dL   BUN 19 6 - 20 mg/dL   Creatinine, Ser 0.79 0.44 - 1.00 mg/dL   Calcium 9.7 8.9 - 10.3 mg/dL   Total Protein 7.9 6.5 - 8.1 g/dL   Albumin 4.4 3.5 - 5.0 g/dL   AST 20 15 - 41 U/L   ALT 11 (L) 14 - 54 U/L   Alkaline Phosphatase 89 38 - 126 U/L   Total Bilirubin 0.5 0.3 - 1.2 mg/dL   GFR calc non Af Amer >60  >60 mL/min   GFR calc Af Amer >60 >60 mL/min   Anion gap 12 5 - 15  Type and screen   Collection Time: 08/10/17 12:21 PM  Result Value Ref Range   ABO/RH(D) O POS    Antibody Screen NEG    Sample Expiration 08/24/2017    Extend sample reason      NO TRANSFUSIONS OR PREGNANCY IN THE PAST 3 MONTHS Performed at [facility], [address], [place],  [address]      Imaging Studies: Dg Chest 2 View  Result Date: 07/26/2017 CLINICAL DATA:  Chest pain starting around 11 30 this morning. Radiates to the left arm, abdomen, and back. Diaphoresis and nausea. Previous history of heart disease. EXAM: CHEST - 2 VIEW COMPARISON:  12/23/2016 FINDINGS: Postoperative changes in the mediastinum. Shallow inspiration. Heart size and pulmonary vascularity are normal. Lungs appear clear and expanded. No blunting of costophrenic angles. No pneumothorax. Mediastinal contours appear intact. Mild degenerative changes in the spine and shoulders. Calcification of the aorta. IMPRESSION: No evidence of active pulmonary disease.  Aortic atherosclerosis. Electronically Signed   By: Lucienne Capers M.D.   On: 07/26/2017 22:52    ULTRASOUND REPORT  Location: [facility] Date of Service:  06/22/2017   Indications: Fibroids Findings:  The uterus measures 11.0 x 8.2 x 7.8 cm. Echo texture is heterogeneous with evidence of focal masses. Within the uterus are multiple suspected fibroids measuring: Fibroid 1: 7.0 x 7.6 x 6.3 cm, Appears to be filling entire uterus. The Endometrium is unable to be delineated due to the presence of fibroid.  Neither ovary is visualized due to the presence of fibroid. Survey of the adnexa demonstrates no adnexal masses. There is no free fluid in the cul de sac.  Impression: 1. Anteverted fibroid uterus appears enlarged at 11.0 x 8.2 x 7.8 cm. 2. Large fibroid measuring 7.0 x 7.6 x 6.3 cm appears to be filling entire  uterus.  Recommendations: 1.Clinical correlation with the patient's History and Physical Exam.   Dario Ave, RDMS  The ultrasound images and findings were reviewed by me and I agree with the above report.   Assessment:    Pelvic pain Fibroid uterus CAD Sleep apnea H/o Asthma DM   Plan:   - Counseling: Procedure, risks, reasons, benefits and complications (including injury to bowel, bladder, major blood vessel, ureter, bleeding, possibility of transfusion, infection, or fistula formation) reviewed in detail. Likelihood of success in alleviating the patient's condition was discussed. Routine postoperative instructions will be reviewed with the patient and her family in detail after surgery.  The patient concurred with the proposed plan, giving informed written consent for the surgery.   - Patient has had clearance from Cardiologist and PCP for surgery.  - Sleep apnea, will need to bring sleep mask if applicable.  - Patient to d/c aspirin use today, and will need to d/c Plavix in 1-2 days. Will resume 24 hrs post-op. Will use low dose Lovenox and  SCDs for DVT prophylaxis.  Preop testing ordered. Instructions reviewed, including NPO after midnight.     Rubie Maid, MD [facility]

## 2017-08-11 NOTE — Progress Notes (Signed)
Cardiac Individual Treatment Plan  Patient Details  Name: Candace [name] MRN: [id number] Date of Birth: [birth year] Referring Provider:     Cardiac Rehab from 06/02/2017 in [facility] Cardiac and Pulmonary Rehab  Referring Provider  Rosanna Randy      Initial Encounter Date:    Cardiac Rehab from 06/02/2017 in [facility] Cardiac and Pulmonary Rehab  Date  06/02/17  Referring Provider  Rosanna Randy      Visit Diagnosis: Chronic stable angina ([place])  Patient's Home Medications on Admission:  Current Outpatient Medications:  .  acetaminophen-codeine (TYLENOL #3) 300-30 MG tablet, Take 1-2 tablets by mouth every 6 (six) hours as needed for moderate pain., Disp: 30 tablet, Rfl: 0 .  albuterol (PROVENTIL HFA;VENTOLIN HFA) 108 (90 Base) MCG/ACT inhaler, Inhale 2 puffs into the lungs every 6 (six) hours as needed for wheezing or shortness of breath., Disp: 1 Inhaler, Rfl: 2 .  aspirin 81 MG chewable tablet, Chew 1 tablet (81 mg total) by mouth daily., Disp: 90 tablet, Rfl: 3 .  clopidogrel (PLAVIX) 75 MG tablet, Take 75 mg by mouth daily., Disp: , Rfl:  .  Coenzyme Q10 (CO Q 10) 100 MG CAPS, Take 100 mg by mouth daily., Disp: 30 capsule, Rfl: 12 .  hydroCHLOROthiazide (MICROZIDE PO), Take 25 mg by mouth daily., Disp: , Rfl:  .  hydrochlorothiazide (MICROZIDE) 12.5 MG capsule, Take 12.5 mg by mouth daily., Disp: , Rfl:  .  isosorbide mononitrate (IMDUR) 30 MG 24 hr tablet, Take 2 tablets (60 mg total) by mouth daily. (Patient taking differently: Take 30 mg by mouth 2 (two) times daily. ), Disp: 60 tablet, Rfl: 12 .  loperamide (IMODIUM) 2 MG capsule, Take by mouth as needed for diarrhea or loose stools., Disp: , Rfl:  .  metFORMIN (GLUCOPHAGE) 1000 MG tablet, Take 1 tablet (1,000 mg total) by mouth 2 (two) times daily with a meal., Disp: 180 tablet, Rfl: 3 .  metoprolol tartrate (LOPRESSOR) 50 MG tablet, TAKE 1 TABLET BY MOUTH TWICE A DAY FOR HIGH BLOOD PRESSURE, Disp: 60 tablet, Rfl: 12 .  nitroGLYCERIN (NITROSTAT) 0.4  MG SL tablet, Place 1 tablet (0.4 mg total) under the tongue every 5 (five) minutes as needed for chest pain., Disp: 30 tablet, Rfl: 0 .  ondansetron (ZOFRAN-ODT) 8 MG disintegrating tablet, DISSOLVE 1 TABLET ON THE TONGUE EVERY 8 HOURS AS NEEDED FOR NAUSEA OR VOMITING, Disp: 30 tablet, Rfl: 3 .  rosuvastatin (CRESTOR) 10 MG tablet, Take 1 tablet (10 mg total) by mouth daily., Disp: 90 tablet, Rfl: 3 .  tiZANidine (ZANAFLEX) 2 MG tablet, Take 2 mg by mouth at bedtime. , Disp: , Rfl:   Past Medical History: Past Medical History:  Diagnosis Date  . Allergy   . Anemia   . Anxiety   . Arrhythmia   . Arthritis   . Coronary artery disease   . Depression   . Diabetes mellitus without complication ([place])   . Dyspnea    doe  . Dysrhythmia   . GERD (gastroesophageal reflux disease)   . Headache   . History of hiatal hernia   . Hyperlipidemia   . Hypertension   . Myocardial infarction ([place])    2016, 04/2017  . Myocardial infarction with cardiac rehabilitation [facility])    MI 2016/ CABG 8/17    FINISHED CARDIAC REHAB 3 WEEKS AGO  . Panic attack   . Pneumonia   . Reflux   . Stroke [facility]) 2015   showed up on MRI; no weakness noted  .  Voice tremor     Tobacco Use: Social History   Tobacco Use  Smoking Status Former Smoker  . Packs/day: 0.50  . Types: Cigarettes  . Last attempt to quit: 10/07/2001  . Years since quitting: 15.8  Smokeless Tobacco Never Used    Labs: Recent Chemical engineer    Labs for ITP Cardiac and Pulmonary Rehab Latest Ref Rng & Units 11/11/2015 08/18/2016 10/01/2016 12/23/2016 03/24/2017   Cholestrol 100 - 199 mg/dL - - 259(H) - -   LDLCALC 0 - 99 mg/dL - - 173(H) - -   HDL >39 mg/dL - - 66 - -   Trlycerides 0 - 149 mg/dL - - 100 - -   Hemoglobin A1c <5.7 % of total Hgb - 8.1 6.9(H) 7.3(H) 7.8(H)   PHART 7.350 - 7.450 - - - - -   PCO2ART 35.0 - 45.0 mmHg - - - - -   HCO3 20.0 - 24.0 mEq/L - - - - -   TCO2 0 - 100 mmol/L 26 - - - -   O2SAT % - - - - -        Exercise Target Goals:    Exercise Program Goal: Individual exercise prescription set using results from initial 6 min walk test and THRR while considering  patient's activity barriers and safety.   Exercise Prescription Goal: Initial exercise prescription builds to 30-45 minutes a day of aerobic activity, 2-3 days per week.  Home exercise guidelines will be given to patient during program as part of exercise prescription that the participant will acknowledge.  Activity Barriers & Risk Stratification: Activity Barriers & Cardiac Risk Stratification - 06/02/17 1340      Activity Barriers & Cardiac Risk Stratification   Activity Barriers  Back Problems;Deconditioning;Shortness of Breath;History of Falls;Chest Pain/Angina    Cardiac Risk Stratification  High       6 Minute Walk: 6 Minute Walk    Row Name 06/02/17 1427 08/04/17 0943       6 Minute Walk   Phase  -  Discharge    Distance  942 feet  1224 feet    Distance % Change  -  30 %    Distance Feet Change  -  283 ft    Walk Time  6 minutes  6 minutes    # of Rest Breaks  0  0    MPH  1.78  2.32    METS  3.14  3.6    RPE  15  11    Perceived Dyspnea   2  1    VO2 Peak  10.99  12.6    Symptoms  Yes (comment)  Yes (comment)    Comments  legs fatigued  calves tight 3/10 (claudication)    Resting HR  84 bpm  51 bpm    Resting BP  120/80  124/76    Resting Oxygen Saturation   96 %  -    Exercise Oxygen Saturation  during 6 min walk  99 %  -    Max Ex. HR  128 bpm  122 bpm    Max Ex. BP  192/104  198/82    2 Minute Post BP  148/74  -       Oxygen Initial Assessment:   Oxygen Re-Evaluation:   Oxygen Discharge (Final Oxygen Re-Evaluation):   Initial Exercise Prescription: Initial Exercise Prescription - 06/02/17 1400      Date of Initial Exercise RX and Referring Provider   Date  06/02/17    Referring Provider  Rosanna Randy      Recumbant Bike   Level  2    RPM  60    Watts  28    Minutes  15    METs  3       NuStep   Level  3    SPM  80    Minutes  15    METs  3      Track   Laps  45    Minutes  15    METs  3      Prescription Details   Frequency (times per week)  3    Duration  Progress to 30 minutes of continuous aerobic without signs/symptoms of physical distress      Intensity   THRR 40-80% of Max Heartrate  112-140    Ratings of Perceived Exertion  11-13    Perceived Dyspnea  0-4      Resistance Training   Training Prescription  Yes    Weight  3 lb    Reps  10-15       Perform Capillary Blood Glucose checks as needed.  Exercise Prescription Changes: Exercise Prescription Changes    Row Name 06/02/17 1400 06/14/17 1500 06/16/17 0900 06/29/17 1400 07/12/17 1600     Response to Exercise   Blood Pressure (Admit)  120/80  106/64  -  114/64  108/60   Blood Pressure (Exercise)  192/104  140/80  -  164/62  135/64   Blood Pressure (Exit)  148/74  124/60  -  128/70  108/62   Heart Rate (Admit)  85 bpm  86 bpm  -  81 bpm  92 bpm   Heart Rate (Exercise)  128 bpm  135 bpm  -  117 bpm  136 bpm   Heart Rate (Exit)  96 bpm  105 bpm  -  88 bpm  88 bpm   Oxygen Saturation (Admit)  96 %  -  -  -  -   Oxygen Saturation (Exit)  99 %  -  -  -  -   Rating of Perceived Exertion (Exercise)  15  16  -  13  12   Symptoms  -  none  -  none  none   Comments  -  first full day of exercise  -  -  -   Duration  -  Continue with 30 min of aerobic exercise without signs/symptoms of physical distress.  -  Continue with 30 min of aerobic exercise without signs/symptoms of physical distress.  Continue with 30 min of aerobic exercise without signs/symptoms of physical distress.   Intensity  -  THRR unchanged  -  THRR unchanged  THRR unchanged     Progression   Progression  -  Continue to progress workloads to maintain intensity without signs/symptoms of physical distress.  -  Continue to progress workloads to maintain intensity without signs/symptoms of physical distress.  Continue to progress  workloads to maintain intensity without signs/symptoms of physical distress.   Average METs  -  2.31  -  2.69  3.06     Resistance Training   Training Prescription  -  Yes  -  Yes  Yes   Weight  -  3 lb  -  3 lbs  3 lbs   Reps  -  10-15  -  10-15  10-15     Interval Training   Interval Training  -  No  -  No  No     Recumbant Bike   Level  -  -  -  -  3   Watts  -  -  -  -  26   Minutes  -  -  -  -  15   METs  -  -  -  -  2.98     NuStep   Level  -  3  -  3  3   Minutes  -  15  -  15  15   METs  -  2.7  -  3  3.3     Track   Laps  -  20  -  30  42   Minutes  -  15  -  15  15   METs  -  1.92  -  2.38  2.91     Home Exercise Plan   Plans to continue exercise at  -  -  Home (comment) walk and dance  Home (comment) walk and dance  Home (comment) walk and dance   Frequency  -  -  Add 3 additional days to program exercise sessions.  Add 3 additional days to program exercise sessions.  Add 3 additional days to program exercise sessions.   Initial Home Exercises Provided  -  -  06/16/17  06/16/17  06/16/17   Row Name 07/26/17 1500 08/09/17 1400           Response to Exercise   Blood Pressure (Admit)  156/80  124/76      Blood Pressure (Exercise)  128/70  198/82      Blood Pressure (Exit)  134/60  114/70      Heart Rate (Admit)  77 bpm  92 bpm      Heart Rate (Exercise)  137 bpm  125 bpm      Heart Rate (Exit)  86 bpm  88 bpm      Rating of Perceived Exertion (Exercise)  12  12      Symptoms  none  none      Duration  Continue with 30 min of aerobic exercise without signs/symptoms of physical distress.  Continue with 30 min of aerobic exercise without signs/symptoms of physical distress.      Intensity  THRR unchanged  THRR unchanged        Progression   Progression  Continue to progress workloads to maintain intensity without signs/symptoms of physical distress.  Continue to progress workloads to maintain intensity without signs/symptoms of physical distress.      Average  METs  2.93  2.88        Resistance Training   Training Prescription  Yes  Yes      Weight  3 lbs  3 lbs      Reps  10-15  10-15        Interval Training   Interval Training  No  No        Recumbant Bike   Level  3  3      Watts  25  32      Minutes  15  15      METs  2.98  3.18        NuStep   Level  3  3      Minutes  15  15      METs  2.9  3.1        Track   Laps  42  30  Minutes  15  15      METs  2.91  2.38        Home Exercise Plan   Plans to continue exercise at  Home (comment) walk and dance  Home (comment) walk and dance      Frequency  Add 3 additional days to program exercise sessions.  Add 3 additional days to program exercise sessions.      Initial Home Exercises Provided  06/16/17  06/16/17         Exercise Comments: Exercise Comments    Row Name 06/07/17 0947           Exercise Comments  Reviewed home exercise with pt today.  Pt plans to walk at home and stores for exercise.  Reviewed THR, pulse, RPE, sign and symptoms, and when to call 911 or MD.  Also discussed weather considerations and indoor options.  Pt voiced understanding.          Exercise Goals and Review: Exercise Goals    Row Name 06/02/17 1426             Exercise Goals   Increase Physical Activity  Yes       Intervention  Provide advice, education, support and counseling about physical activity/exercise needs.;Develop an individualized exercise prescription for aerobic and resistive training based on initial evaluation findings, risk stratification, comorbidities and participant's personal goals.       Expected Outcomes  Short Term: Attend rehab on a regular basis to increase amount of physical activity.;Long Term: Add in home exercise to make exercise part of routine and to increase amount of physical activity.;Long Term: Exercising regularly at least 3-5 days a week.       Increase Strength and Stamina  Yes       Intervention  Provide advice, education, support and counseling  about physical activity/exercise needs.;Develop an individualized exercise prescription for aerobic and resistive training based on initial evaluation findings, risk stratification, comorbidities and participant's personal goals.       Expected Outcomes  Short Term: Increase workloads from initial exercise prescription for resistance, speed, and METs.;Short Term: Perform resistance training exercises routinely during rehab and add in resistance training at home;Long Term: Improve cardiorespiratory fitness, muscular endurance and strength as measured by increased METs and functional capacity (6MWT)       Able to understand and use rate of perceived exertion (RPE) scale  Yes       Intervention  Provide education and explanation on how to use RPE scale       Expected Outcomes  Short Term: Able to use RPE daily in rehab to express subjective intensity level;Long Term:  Able to use RPE to guide intensity level when exercising independently       Able to understand and use Dyspnea scale  Yes       Intervention  Provide education and explanation on how to use Dyspnea scale       Expected Outcomes  Short Term: Able to use Dyspnea scale daily in rehab to express subjective sense of shortness of breath during exertion;Long Term: Able to use Dyspnea scale to guide intensity level when exercising independently       Knowledge and understanding of Target Heart Rate Range (THRR)  Yes       Intervention  Provide education and explanation of THRR including how the numbers were predicted and where they are located for reference       Expected Outcomes  Short Term: Able to  state/look up THRR;Long Term: Able to use THRR to govern intensity when exercising independently;Short Term: Able to use daily as guideline for intensity in rehab       Able to check pulse independently  Yes       Intervention  Provide education and demonstration on how to check pulse in carotid and radial arteries.;Review the importance of being able  to check your own pulse for safety during independent exercise       Expected Outcomes  Short Term: Able to explain why pulse checking is important during independent exercise;Long Term: Able to check pulse independently and accurately       Understanding of Exercise Prescription  Yes       Intervention  Provide education, explanation, and written materials on patient's individual exercise prescription       Expected Outcomes  Short Term: Able to explain program exercise prescription;Long Term: Able to explain home exercise prescription to exercise independently          Exercise Goals Re-Evaluation : Exercise Goals Re-Evaluation    Row Name 06/07/17 0947 06/16/17 0930 06/29/17 1434 07/12/17 1603 07/26/17 1553     Exercise Goal Re-Evaluation   Exercise Goals Review  Knowledge and understanding of Target Heart Rate Range (THRR);Able to understand and use rate of perceived exertion (RPE) scale;Understanding of Exercise Prescription  Understanding of Exercise Prescription;Increase Physical Activity;Increase Strength and Stamina;Knowledge and understanding of Target Heart Rate Range (THRR);Able to understand and use rate of perceived exertion (RPE) scale;Able to check pulse independently  Increase Physical Activity;Understanding of Exercise Prescription;Increase Strength and Stamina  Increase Physical Activity;Understanding of Exercise Prescription;Increase Strength and Stamina  Increase Physical Activity;Understanding of Exercise Prescription;Increase Strength and Stamina   Comments  Reviewed RPE scale, THR and program prescription with pt today.  Pt voiced understanding and was given a copy of goals to take home.   Reviewed home exercise with pt today.  Pt plans to walk and dance for exercise.  Reviewed THR, pulse, RPE, sign and symptoms, NTG use, and when to call 911 or MD.  Also discussed weather considerations and indoor options.  Pt voiced understanding.  Candace [name] is doing well in rehab.  She is dancing  around the track again serving as entertainment to her classmates.  She is up to 30 laps.  We will continue to monitor her progression.   Candace [name] has continued to do well in rehab.  She was dancing again today and doing the twist to the music.  She was able to get 42 laps today!!  She is also up to 3.3 METs on the NuStep.  We will continue to monitor her progress.   Candace [name] has been doing well in rehab.  She continues to get her 42 laps in daily!  We will encourage her to walk at little faster while dancing to continue to make progress.  She is up to level 3 on the bike.  We will continue to monitor her progression.    Expected Outcomes  Short: Use RPE daily to regulate intensity.  Long: Follow program prescription in THR  Short: Add in one day of walking and dancing at home.  Long: Follow home exercise prescription.   Short: Continue to try to walk and dance at home.  Long: Continue to build up strength and stamina.   Short: Increase workload on recumbent bike.  Long: Continue to be more active at home.   Short: Increase laps.  Long: Continue to increase strength and stamina.  Candace [name] Name 07/28/17 0858 08/09/17 1411           Exercise Goal Re-Evaluation   Exercise Goals Review  Increase Physical Activity;Understanding of Exercise Prescription;Increase Strength and Stamina  Increase Physical Activity;Understanding of Exercise Prescription;Increase Strength and Stamina      Comments  Candace [name] has been walking at home on her off days.  She has been going about a 1 mile daily!!  She is feeling stronger and has more stamina then when she started.  She still wants to get more energy back.   Candace [name] has been doing well in rehab.  She is starting to near graduation already.  She improved her post 6MWT bu 271f!!  She is planning to continue to walk at home after graduation. We have also talked to her about our PAD/SET program that is going to start this summer and she has shown interest in that as well.        Expected Outcomes   Short: Increase laps.  Long: Continue to increase strength and stamina.   Short: Graduate  Long: Continue to exercise independently         Discharge Exercise Prescription (Final Exercise Prescription Changes): Exercise Prescription Changes - 08/09/17 1400      Response to Exercise   Blood Pressure (Admit)  124/76    Blood Pressure (Exercise)  198/82    Blood Pressure (Exit)  114/70    Heart Rate (Admit)  92 bpm    Heart Rate (Exercise)  125 bpm    Heart Rate (Exit)  88 bpm    Rating of Perceived Exertion (Exercise)  12    Symptoms  none    Duration  Continue with 30 min of aerobic exercise without signs/symptoms of physical distress.    Intensity  THRR unchanged      Progression   Progression  Continue to progress workloads to maintain intensity without signs/symptoms of physical distress.    Average METs  2.88      Resistance Training   Training Prescription  Yes    Weight  3 lbs    Reps  10-15      Interval Training   Interval Training  No      Recumbant Bike   Level  3    Watts  32    Minutes  15    METs  3.18      NuStep   Level  3    Minutes  15    METs  3.1      Track   Laps  30    Minutes  15    METs  2.38      Home Exercise Plan   Plans to continue exercise at  Home (comment) walk and dance    Frequency  Add 3 additional days to program exercise sessions.    Initial Home Exercises Provided  06/16/17       Nutrition:  Target Goals: Understanding of nutrition guidelines, daily intake of sodium <15010m cholesterol <20075mcalories 30% from fat and 7% or less from saturated fats, daily to have 5 or more servings of fruits and vegetables.  Biometrics: Pre Biometrics - 06/02/17 1425      Pre Biometrics   Height  5' 2.75" (1.594 m)    Weight  173 lb 8 oz (78.7 kg)    Waist Circumference  38 inches    Hip Circumference  42.5 inches    Waist to Hip Ratio  0.89 %    BMI (  Calculated)  30.97    Single Leg Stand  4.26 seconds      Post Biometrics -  08/04/17 0944       Post  Biometrics   Height  5' 2.75" (1.594 m)    Weight  174 lb (78.9 kg)    Waist Circumference  38 inches    Hip Circumference  42 inches    Waist to Hip Ratio  0.9 %    BMI (Calculated)  31.06    Single Leg Stand  4.07 seconds       Nutrition Therapy Plan and Nutrition Goals: Nutrition Therapy & Goals - 06/09/17 0957      Nutrition Therapy   Diet  TLC/ DASH    Protein (specify units)  11oz    Fiber  25 grams    Whole Grain Foods  3 servings    Saturated Fats  14 max. grams    Fruits and Vegetables  4 servings/day 8 ideal    Sodium  1500 grams      Personal Nutrition Goals   Nutrition Goal  Consume sources of protein more regularly, ideally daily she agrees to drink more milk or cheese    Personal Goal #2  Consume Glucerna nutritonal drinks more regularly, use as a meal replacement or snack between meals eats 1-2 meals/day typically    Personal Goal #3  Eat foods that have a stong smell such as soups or that have strong flavorings such as citrus or salt-free herbs/ spices    Comments  patient c/o decreased appetite and taste changes. she has made changes to her diet in the past year including eating more fruits and vegetables, decreasing her sodium intake, and eating less fried foods/ red meats      Intervention Plan   Intervention  Prescribe, educate and counsel regarding individualized specific dietary modifications aiming towards targeted core components such as weight, hypertension, lipid management, diabetes, heart failure and other comorbidities.    Expected Outcomes  Short Term Goal: Understand basic principles of dietary content, such as calories, fat, sodium, cholesterol and nutrients.;Short Term Goal: A plan has been developed with personal nutrition goals set during dietitian appointment.;Long Term Goal: Adherence to prescribed nutrition plan.       Nutrition Assessments: Nutrition Assessments - 08/04/17 0847      MEDFICTS Scores   Pre Score   10    Post Score  15    Score Difference  5       Nutrition Goals Re-Evaluation: Nutrition Goals Re-Evaluation    Row Name 06/30/17 1023 07/28/17 0906           Goals   Current Weight  172 lb (78 kg)  174 lb (78.9 kg)      Nutrition Goal  Candace [name] has been trying to eat protein when she eats.   Candace [name] has been trying to eat protein when she eats. More strong flavors and strong smell.       Comment  -  Sreshta is down to only eating 1 meal a day.  She needs to eat more protein.  She is eating a variety of fruit.  She is also going to try to some stronger smells and flavors like garlic and balck bean and corn salad.       Expected Outcome  -  Short: Eat 3 meals a day.  Long: Continue to eat heart healthy.         Personal Goal #2 Re-Evaluation   Personal Goal #2  Candace [name] said she has not been as hungry and not eating as much since she needs a hysterectomy since she has a fibroid that needs to be removed and she feels it is pressing on her abd.  -         Nutrition Goals Discharge (Final Nutrition Goals Re-Evaluation): Nutrition Goals Re-Evaluation - 07/28/17 0906      Goals   Current Weight  174 lb (78.9 kg)    Nutrition Goal  Candace [name] has been trying to eat protein when she eats. More strong flavors and strong smell.     Comment  Candace [name] is down to only eating 1 meal a day.  She needs to eat more protein.  She is eating a variety of fruit.  She is also going to try to some stronger smells and flavors like garlic and balck bean and corn salad.     Expected Outcome  Short: Eat 3 meals a day.  Long: Continue to eat heart healthy.        Psychosocial: Target Goals: Acknowledge presence or absence of significant depression and/or stress, maximize coping skills, provide positive support system. Participant is able to verbalize types and ability to use techniques and skills needed for reducing stress and depression.   Initial Review & Psychosocial Screening: Initial Psych Review & Screening - 06/02/17 1342       Initial Review   Current issues with  Current Stress Concerns    Source of Stress Concerns  Unable to participate in former interests or hobbies;Unable to perform yard/household activities      [place]?  Yes      Barriers   Psychosocial barriers to participate in program  There are no identifiable barriers or psychosocial needs.;The patient should benefit from training in stress management and relaxation.      Screening Interventions   Interventions  Encouraged to exercise;Provide feedback about the scores to participant;To provide support and resources with identified psychosocial needs    Expected Outcomes  Short Term goal: Utilizing psychosocial counselor, staff and physician to assist with identification of specific Stressors or current issues interfering with healing process. Setting desired goal for each stressor or current issue identified.;Long Term Goal: Stressors or current issues are controlled or eliminated.;Short Term goal: Identification and review with participant of any Quality of Life or Depression concerns found by scoring the questionnaire.;Long Term goal: The participant improves quality of Life and PHQ9 Scores as seen by post scores and/or verbalization of changes       Quality of Life Scores:  Quality of Life - 08/04/17 0848      Quality of Life Scores   Health/Function Pre  9.47 %    Health/Function Post  18.5 %    Health/Function % Change  95.35 %    Socioeconomic Pre  18.58 %    Socioeconomic Post  22.21 %    Socioeconomic % Change   19.54 %    Psych/Spiritual Pre  18.86 %    Psych/Spiritual Post  24 %    Psych/Spiritual % Change  27.25 %    Family Pre  21.9 %    Family Post  29.38 %    Family % Change  34.16 %    GLOBAL Pre  15 %    GLOBAL Post  21.77 %    GLOBAL % Change  45.13 %      Scores of 19 and below usually indicate a poorer quality of life in these  areas.  A difference of  2-3 points is a clinically meaningful  difference.  A difference of 2-3 points in the total score of the Quality of Life Index has been associated with significant improvement in overall quality of life, self-image, physical symptoms, and general health in studies assessing change in quality of life.  PHQ-9: Recent Review Flowsheet Data    Depression screen [facility] 2/9 08/04/2017 06/02/2017 04/25/2017 11/04/2016 07/28/2016   Decreased Interest 1 2 0 0 3   Down, Depressed, Hopeless 0 1 0 0 0   PHQ - 2 Score 1 3 0 0 3   Altered sleeping _0 0 0   Tired, decreased energy _1 0 3   Change in appetite 0 2 2 0 3   Feeling bad or failure about yourself  0 1 0 0 0   Trouble concentrating 0 1 0 0 0   Moving slowly or fidgety/restless 1 2 0 0 0   Suicidal thoughts 0 0 2  - 0   PHQ-9 Score _2 0 9   Difficult doing work/chores Not difficult at all Not difficult at all Somewhat difficult - -     Interpretation of Total Score  Total Score Depression Severity:  1-4 = Minimal depression, 5-9 = Mild depression, 10-14 = Moderate depression, 15-19 = Moderately severe depression, 20-27 = Severe depression   Psychosocial Evaluation and Intervention: Psychosocial Evaluation - 06/30/17 0949      Psychosocial Evaluation & Interventions   Comments  Counselor met with Ms [facility]) today for initial psychosocial evaluation.  She returned to this program after a year due to a recent heart attack.  Lina has a strong support system living with her daughter; multiple sisters and brothers and other children and she has home health involved in transporting her to/from this program.  Anhar has had multiple falls lately and is being seen by a neurologist for chronic headaches.  She also is not sleeping well (maybe 3-4 hours/night) and is seeing the "Sleep Dr" next month about following her on this.  Kielee states her appetite is not good and she has more anxiety lately since she can't drive or live independently while she is a fall risk.  This is her primary stress  and her goal for this program is "to get my life back."  Staff will follow with Kadeshia while in this program.    Expected Sulphur will see the sleep Dr. to help with her sleep concerns.  Long - Railey will exercise consistently to achieve her stated goal of feeling stronger and getting her "life back."         Psychosocial Re-Evaluation: Psychosocial Re-Evaluation    Row Name 06/30/17 1027 07/28/17 0915           Psychosocial Re-Evaluation   Current issues with  -  Current Stress Concerns;Current Sleep Concerns      Comments  Adream is still unable to drive but she said when she sees her MD in Sept she hopes he will let her drive. Nakyah reports that she is having problems sleeping still due to her chronic headaches. Nari said accupuncture for her headaches she had to stop since it hurt too much. Rufus said her neurologist did not want her to go to a chiropracter for her headaches. She has been on medicine for her headaches for several months but  Dayan reports she still has headaches.   Latesha is doing well mentally.  She is doing much better than she was.  She still wants to be able to drive.  She has now acknowledged that she was mad at herself because she couldn't do want she wanted to do.  She continues to have headaches and they are trying to work on it.  As a result, she continues to not sleep very well.  Her next follow is in July.  She has enjoyed coming back to class again. She enjoys the staff and dancing around the track. She knows that by coming here, no matter how much she didn't want to come she would leave happier.  She also enjoys the comraderie of her classmates.       Expected Outcomes  -  Short: Continue to cope with her headaches and remain postive.  Long: Continue to attend class and be postive.       Interventions  Encouraged to attend Cardiac Rehabilitation for the exercise  Encouraged to attend Cardiac Rehabilitation for the exercise;Stress management education      Continue  Psychosocial Services   -  Follow up required by staff         Psychosocial Discharge (Final Psychosocial Re-Evaluation): Psychosocial Re-Evaluation - 07/28/17 0915      Psychosocial Re-Evaluation   Current issues with  Current Stress Concerns;Current Sleep Concerns    Comments  Candace [name] is doing well mentally.  She is doing much better than she was.  She still wants to be able to drive.  She has now acknowledged that she was mad at herself because she couldn't do want she wanted to do.  She continues to have headaches and they are trying to work on it.  As a result, she continues to not sleep very well.  Her next follow is in July.  She has enjoyed coming back to class again. She enjoys the staff and dancing around the track. She knows that by coming here, no matter how much she didn't want to come she would leave happier.  She also enjoys the comraderie of her classmates.     Expected Outcomes  Short: Continue to cope with her headaches and remain postive.  Long: Continue to attend class and be postive.     Interventions  Encouraged to attend Cardiac Rehabilitation for the exercise;Stress management education    Continue Psychosocial Services   Follow up required by staff       Vocational Rehabilitation: Provide vocational rehab assistance to qualifying candidates.   Vocational Rehab Evaluation & Intervention: Vocational Rehab - 06/02/17 1206      Initial Vocational Rehab Evaluation & Intervention   Assessment shows need for Vocational Rehabilitation  No       Education: Education Goals: Education classes will be provided on a variety of topics geared toward better understanding of heart health and risk factor modification. Participant will state understanding/return demonstration of topics presented as noted by education test scores.  Learning Barriers/Preferences: Learning Barriers/Preferences - 06/02/17 1343      Learning Barriers/Preferences   Learning Barriers  Exercise Concerns  Exertion causes SOB.   Chronic headache from back problems    Learning Preferences  None       Education Topics:  AED/CPR: - Group verbal and written instruction with the use of models to demonstrate the basic use of the AED with the basic ABC's of resuscitation.   Cardiac Rehab from 08/04/2017 in [facility] Cardiac and Pulmonary Rehab  Date  06/30/17  Educator  CE  Instruction Review Code  1-  Verbalizes Understanding      General Nutrition Guidelines/Fats and Fiber: -Group instruction provided by verbal, written material, models and posters to present the general guidelines for heart healthy nutrition. Gives an explanation and review of dietary fats and fiber.   Cardiac Rehab from 04/08/2016 in [facility] Cardiac and Pulmonary Rehab  Date  04/06/16  Educator  PI  Instruction Review Code (retired)  2- meets goals/outcomes      Controlling Sodium/Reading Food Labels: -Group verbal and written material supporting the discussion of sodium use in heart healthy nutrition. Review and explanation with models, verbal and written materials for utilization of the food label.   Cardiac Rehab from 08/04/2017 in [facility] Cardiac and Pulmonary Rehab  Date  06/28/17  Educator  PI  Instruction Review Code  1- Verbalizes Understanding      Exercise Physiology & General Exercise Guidelines: - Group verbal and written instruction with models to review the exercise physiology of the cardiovascular system and associated critical values. Provides general exercise guidelines with specific guidelines to those with heart or lung disease.    Cardiac Rehab from 08/04/2017 in [facility] Cardiac and Pulmonary Rehab  Date  07/07/17  Educator  [facility]  Instruction Review Code  1- Verbalizes Understanding      Aerobic Exercise & Resistance Training: - Gives group verbal and written instruction on the various components of exercise. Focuses on aerobic and resistive training programs and the benefits of this training and how to safely  progress through these programs..   Cardiac Rehab from 08/04/2017 in [facility] Cardiac and Pulmonary Rehab  Date  07/12/17  Educator  AS  Instruction Review Code  1- Verbalizes Understanding      Flexibility, Balance, Mind/Body Relaxation: Provides group verbal/written instruction on the benefits of flexibility and balance training, including mind/body exercise modes such as yoga, pilates and tai chi.  Demonstration and skill practice provided.   Cardiac Rehab from 04/08/2016 in [facility] Cardiac and Pulmonary Rehab  Date  02/26/16  Educator  [facility]  Instruction Review Code (retired)  2- meets goals/outcomes      Stress and Anxiety: - Provides group verbal and written instruction about the health risks of elevated stress and causes of high stress.  Discuss the correlation between heart/lung disease and anxiety and treatment options. Review healthy ways to manage with stress and anxiety.   Cardiac Rehab from 08/04/2017 in [facility] Cardiac and Pulmonary Rehab  Date  07/26/17  Educator  [facility]  Instruction Review Code  1- Verbalizes Understanding      Depression: - Provides group verbal and written instruction on the correlation between heart/lung disease and depressed mood, treatment options, and the stigmas associated with seeking treatment.   Cardiac Rehab from 08/04/2017 in [facility] Cardiac and Pulmonary Rehab  Date  07/14/17  Educator  [facility]  Instruction Review Code  1- Verbalizes Understanding      Anatomy & Physiology of the Heart: - Group verbal and written instruction and models provide basic cardiac anatomy and physiology, with the coronary electrical and arterial systems. Review of Valvular disease and Heart Failure   Cardiac Rehab from 08/04/2017 in [facility] Cardiac and Pulmonary Rehab  Date  07/28/17  Educator  CE  Instruction Review Code  1- Verbalizes Understanding      Cardiac Procedures: - Group verbal and written instruction to review commonly prescribed medications for heart disease. Reviews the  medication, class of the drug, and side effects. Includes the steps to properly store meds and maintain the prescription regimen. (beta blockers  and nitrates)   Cardiac Rehab from 08/04/2017 in [facility] Cardiac and Pulmonary Rehab  Date  06/23/17  Educator  CE  Instruction Review Code  1- Verbalizes Understanding      Cardiac Medications I: - Group verbal and written instruction to review commonly prescribed medications for heart disease. Reviews the medication, class of the drug, and side effects. Includes the steps to properly store meds and maintain the prescription regimen.   Cardiac Rehab from 08/04/2017 in [facility] Cardiac and Pulmonary Rehab  Date  08/02/17  Educator  SB  Instruction Review Code  1- Verbalizes Understanding      Cardiac Medications II: -Group verbal and written instruction to review commonly prescribed medications for heart disease. Reviews the medication, class of the drug, and side effects. (all other drug classes)   Cardiac Rehab from 08/04/2017 in [facility] Cardiac and Pulmonary Rehab  Date  07/21/17  Educator  CE  Instruction Review Code  1- Verbalizes Understanding       Go Sex-Intimacy & Heart Disease, Get SMART - Goal Setting: - Group verbal and written instruction through game format to discuss heart disease and the return to sexual intimacy. Provides group verbal and written material to discuss and apply goal setting through the application of the S.M.A.R.T. Method.   Cardiac Rehab from 08/04/2017 in [facility] Cardiac and Pulmonary Rehab  Date  06/23/17  Educator  CE  Instruction Review Code  1- Verbalizes Understanding      Other Matters of the Heart: - Provides group verbal, written materials and models to describe Stable Angina and Peripheral Artery. Includes description of the disease process and treatment options available to the cardiac patient.   Exercise & Equipment Safety: - Individual verbal instruction and demonstration of equipment use and safety with use  of the equipment.   Cardiac Rehab from 08/04/2017 in [facility] Cardiac and Pulmonary Rehab  Date  06/02/17  Educator  SB  Instruction Review Code  1- Verbalizes Understanding      Infection Prevention: - Provides verbal and written material to individual with discussion of infection control including proper hand washing and proper equipment cleaning during exercise session.   Cardiac Rehab from 08/04/2017 in [facility] Cardiac and Pulmonary Rehab  Date  06/02/17  Educator  SB  Instruction Review Code  1- Verbalizes Understanding      Falls Prevention: - Provides verbal and written material to individual with discussion of falls prevention and safety.   Cardiac Rehab from 08/04/2017 in [facility] Cardiac and Pulmonary Rehab  Date  06/02/17  Educator  SB  Instruction Review Code  1- Verbalizes Understanding      Diabetes: - Individual verbal and written instruction to review signs/symptoms of diabetes, desired ranges of glucose level fasting, after meals and with exercise. Acknowledge that pre and post exercise glucose checks will be done for 3 sessions at entry of program.   Cardiac Rehab from 04/08/2016 in [facility] Cardiac and Pulmonary Rehab  Date  01/12/16  Educator  C. ENterkinRN  Instruction Review Code (retired)  2- meets goals/outcomes      Know Your Numbers and Risk Factors: -Group verbal and written instruction about important numbers in your health.  Discussion of what are risk factors and how they play a role in the disease process.  Review of Cholesterol, Blood Pressure, Diabetes, and BMI and the role they play in your overall health.   Cardiac Rehab from 08/04/2017 in [facility] Cardiac and Pulmonary Rehab  Date  07/21/17  Educator  CE  Instruction Review Code  1- Verbalizes Understanding      Sleep Hygiene: -Provides group verbal and written instruction about how sleep can affect your health.  Define sleep hygiene, discuss sleep cycles and impact of sleep habits. Review good sleep hygiene tips.     Other: -Provides group and verbal instruction on various topics (see comments)   Knowledge Questionnaire Score: Knowledge Questionnaire Score - 08/04/17 0847      Knowledge Questionnaire Score   Post Score  23/28       Core Components/Risk Factors/Patient Goals at Admission: Personal Goals and Risk Factors at Admission - 06/02/17 1341      Core Components/Risk Factors/Patient Goals on Admission    Weight Management  Yes;Obesity    Intervention  Weight Management: Develop a combined nutrition and exercise program designed to reach desired caloric intake, while maintaining appropriate intake of nutrient and fiber, sodium and fats, and appropriate energy expenditure required for the weight goal.    Admit Weight  173 lb (78.5 kg)    Goal Weight: Short Term  171 lb (77.6 kg)    Goal Weight: Long Term  159 lb (72.1 kg)    Expected Outcomes  Short Term: Continue to assess and modify interventions until short term weight is achieved;Long Term: Adherence to nutrition and physical activity/exercise program aimed toward attainment of established weight goal;Weight Loss: Understanding of general recommendations for a balanced deficit meal plan, which promotes 1-2 lb weight loss per week and includes a negative energy balance of [id number] kcal/d    Diabetes  Yes    Intervention  Provide education about signs/symptoms and action to take for hypo/hyperglycemia.;Provide education about proper nutrition, including hydration, and aerobic/resistive exercise prescription along with prescribed medications to achieve blood glucose in normal ranges: Fasting glucose 65-99 mg/dL    Expected Outcomes  Short Term: Participant verbalizes understanding of the signs/symptoms and immediate care of hyper/hypoglycemia, proper foot care and importance of medication, aerobic/resistive exercise and nutrition plan for blood glucose control.;Long Term: Attainment of HbA1C < 7%.    Hypertension  Yes    Intervention  Provide  education on lifestyle modifcations including regular physical activity/exercise, weight management, moderate sodium restriction and increased consumption of fresh fruit, vegetables, and low fat dairy, alcohol moderation, and smoking cessation.;Monitor prescription use compliance.    Expected Outcomes  Short Term: Continued assessment and intervention until BP is < 140/59m HG in hypertensive participants. < 130/85mHG in hypertensive participants with diabetes, heart failure or chronic kidney disease.;Long Term: Maintenance of blood pressure at goal levels.    Lipids  Yes    Intervention  Provide education and support for participant on nutrition & aerobic/resistive exercise along with prescribed medications to achieve LDL <7042mHDL >70m43m  Expected Outcomes  Short Term: Participant states understanding of desired cholesterol values and is compliant with medications prescribed. Participant is following exercise prescription and nutrition guidelines.;Long Term: Cholesterol controlled with medications as prescribed, with individualized exercise RX and with personalized nutrition plan. Value goals: LDL < 70mg70mL > 40 mg.       Core Components/Risk Factors/Patient Goals Review:  Goals and Risk Factor Review    Row Name 06/30/17 1025 07/28/17 0859           Core Components/Risk Factors/Patient Goals Review   Personal Goals Review  -  Weight Management/Obesity;Hypertension;Diabetes;Lipids;Heart Failure      Review  Lin iIvahoing to add on a Cardiac Rehab day since she will need a hysterectomy the first part  of May. Candace [name] has only completed like 6/36 sessions of Cardiac rehab so far attending 2 days/week. Her highest blood sugars tha she reports is 172. Her blood pressure upon arrival to Cardiac REhab was 126/80.   Candace [name] was in ED on Tuesday for atypical chest pain. She rulled out for MI, but she went as she just wasn't feeling good and NTG did not relieve pain.  She was sent home for follow up as  outpatient.  Her blood pressure run up and down but they are still working on adjusting her medications.  She has been checking her pressures at home frequently.  Her weight was 174 today and she thinks its fluid.  She does report some swelling in her feet.  We talked about ways to help alleviate fluid retention in feet and pain (ie sitting with feet up, sleeping with feet up, compresssion stockings).  She is going to take her extra fluid pill today to try to help as well.  And if her weight is not back down she is going to call the doctor.   Her blood sugars have been good.       Expected Outcomes  Candace [name] is going to cont to exercise and attend Cardiac Rehab until she needs her hysterectomy.   Short: Schedule follow up with doctor from ED visit and elevated fluid levels.  Long: Continue to weigh daily and monitor heart failure.          Core Components/Risk Factors/Patient Goals at Discharge (Final Review):  Goals and Risk Factor Review - 07/28/17 0859      Core Components/Risk Factors/Patient Goals Review   Personal Goals Review  Weight Management/Obesity;Hypertension;Diabetes;Lipids;Heart Failure    Review  Candace [name] was in ED on Tuesday for atypical chest pain. She rulled out for MI, but she went as she just wasn't feeling good and NTG did not relieve pain.  She was sent home for follow up as outpatient.  Her blood pressure run up and down but they are still working on adjusting her medications.  She has been checking her pressures at home frequently.  Her weight was 174 today and she thinks its fluid.  She does report some swelling in her feet.  We talked about ways to help alleviate fluid retention in feet and pain (ie sitting with feet up, sleeping with feet up, compresssion stockings).  She is going to take her extra fluid pill today to try to help as well.  And if her weight is not back down she is going to call the doctor.   Her blood sugars have been good.     Expected Outcomes  Short: Schedule follow  up with doctor from ED visit and elevated fluid levels.  Long: Continue to weigh daily and monitor heart failure.        ITP Comments: ITP Comments    Row Name 06/02/17 1336 06/22/17 0556 06/22/17 0632 07/14/17 1021 07/20/17 0548   ITP Comments  New orientation after recent angioplasty and chronic stable angina diagnosis.  ITP completed and sent to Dr [name] Heck for review, changes as needed and signature. Documentation of diagnosis can be found in [facility] 03/30/2017 and 04/25/2017 visits,   Came to class Tuesday 06/21/2017 with complaints of ankles swelling and increased shortness of breath since last Thursday. Advised she see her cardiologist before returning to program. Staff member took her via wheelchair to her cardiology office and she was able to see the NP that morning.  Brilinta was discontinued and PLavix added.  No restrictions to return to class  30 Day review. Continue with ITP unless directed changes per Medical Director review.    run SVT? that [facility] felt. Sent to Dr. Alveria Apley office via wheelchair {aken by Alberteen Sam. Candace [name] that she had a similiar episode this am at home and felt it like she felt this SVT. Computer monitor read it as vtach but it had Pwaves.  Candace [name] said she has been talking all her medicines as directed and does not drink caffeine. Candace [name] [name] that her primary care MD gave her an injection for her pulled muscle that she had after she left Cardiac REhab last time.   30 day review. Continue with ITP unless directed changes per Medical Director   [facility] Name 07/20/17 0552 08/11/17 1051 08/11/17 1131 08/11/17 1142     ITP Comments  30 day review. Continue with ITP unless directed changes per Medical Director  Candace [name] left a message yesterday saying that she has a hysterectomy scheduled for next week and will need to out for 6 weeks afterwards.  I called her this morning requesting her to call back to discuss options for rehab.  Candace [name] called back.  We will discharge her from the program at  this time and once she is cleared she would like to return to finish the program.   Discharge ITP sent and signed by Dr. [name] Heck.  Discharge Summary routed to PCP and cardiologist.       Comments: Discharge ITP

## 2017-08-11 NOTE — Telephone Encounter (Signed)
Candace [name] called back.  We will discharge her from the program at this time and once she is cleared she would like to return to finish the program.

## 2017-08-11 NOTE — H&P (View-Only) (Signed)
GYNECOLOGY PREOPERATIVE HISTORY AND PHYSICAL   Subjective:  Candace [name] is a 67 y.o. [id number] here for surgical management of pelvic pressure and pain.   Indications for procedure include: enlarged fibroid uterus. Significant preoperative concerns include: CAD with h/o CABG in 2017 with recent coronary artery stent placement in 04/2017, paroxysmal afib, mild obstructive sleep apnea, DM, asthma, and sleep apnea.     Proposed surgery: Total Abdominal Hysterectomy with Bilateral Salpingo-oophorectomy    Pertinent Gynecological History: Menses: post-menopausal Last mammogram: normal Date: 06/12/2015 Last pap: normal Date: 02/2017 Last colonoscopy: normal.  Date: 09/2013.     Past Medical History:  Diagnosis Date  . Allergy   . Anemia   . Anxiety   . Arrhythmia   . Arthritis   . Coronary artery disease   . Depression   . Diabetes mellitus without complication ([place])   . Dyspnea    doe  . Dysrhythmia   . GERD (gastroesophageal reflux disease)   . Headache   . History of hiatal hernia   . Hyperlipidemia   . Hypertension   . Myocardial infarction ([place])    2016, 04/2017  . Myocardial infarction with cardiac rehabilitation [facility])    MI 2016/ CABG 8/17    FINISHED CARDIAC REHAB 3 WEEKS AGO  . Panic attack   . Pneumonia   . Reflux   . Stroke [facility]) 2015   showed up on MRI; no weakness noted  . Voice tremor     Past Surgical History:  Procedure Laterality Date  . APPENDECTOMY    . ARTERY BIOPSY Right 04/26/2016   Procedure: BIOPSY TEMPORAL ARTERY;  Surgeon: Margaretha Sheffield, MD;  Location: [facility] ORS;  Service: ENT;  Laterality: Right;  . CARDIAC CATHETERIZATION N/A 11/06/2015   Procedure: Left Heart Cath and Coronary Angiography;  Surgeon: Corey Skains, MD;  Location: [place] CV LAB;  Service: Cardiovascular;  Laterality: N/A;  . CESAREAN SECTION    . CORONARY ANGIOPLASTY  04/2017   [place]  . CORONARY ARTERY BYPASS GRAFT N/A 11/10/2015   Procedure:  CORONARY ARTERY BYPASS GRAFTING (CABG), ON PUMP, TIMES FOUR, USING LEFT INTERNAL MAMMARY ARTERY, BILATERAL GREATER SAPHENOUS VEINS HARVESTED ENDOSCOPICALLY;  Surgeon: [name], MD;  Location: [place];  Service: Open Heart Surgery;  Laterality: N/A;  LIMA-LAD; SEQ SVG-OM1-OM2; SVG-PL  . CORONARY STENT INTERVENTION N/A 08/05/2016   Procedure: Coronary Stent Intervention;  Surgeon: Isaias Cowman, MD;  Location: [place] [facility];  Service: Cardiovascular;  Laterality: N/A;  . LEFT HEART CATH AND CORONARY ANGIOGRAPHY N/A 08/05/2016   Procedure: Left Heart Cath and Coronary Angiography;  Surgeon: Isaias Cowman, MD;  Location: [place] [facility];  Service: Cardiovascular;  Laterality: N/A;  . TEE WITHOUT CARDIOVERSION N/A 11/10/2015   Procedure: TRANSESOPHAGEAL ECHOCARDIOGRAM (TEE);  Surgeon: [name], MD;  Location: [place];  Service: Open Heart Surgery;  Laterality: N/A;  . TUBAL LIGATION      OB History  Gravida Para Term Preterm AB Living  5 5 3 2       SAB TAB Ectopic Multiple Live Births          2    # Outcome Date GA Lbr Len/2nd Weight Sex Delivery Anes PTL Lv  5 Term 35    M CS-Unspec     4 Term 38    F CS-Unspec     3 Preterm 53    M    ND  2 Preterm 1975    M  ND  1 Term 74    F Vag-Breech       Family History  Problem Relation Age of Onset  . Cancer Father   . Hypertension Father   . Heart disease Father   . Asthma Father   . Cancer Mother   . Hypertension Mother   . Pancreatic cancer Mother   . Cancer Sister   . Breast cancer Sister 49  . Breast cancer Sister 92  . Lung cancer Brother   . Pancreatic cancer Sister   . Cancer Sister    Social History   Socioeconomic History  . Marital status: Divorced    Spouse name: Not on file  . Number of children: Not on file  . Years of education: Not on file  . Highest education level: Not on file  Occupational History  . Not on file  Social Needs  . Financial resource strain: Not on  file  . Food insecurity:    Worry: Not on file    Inability: Not on file  . Transportation needs:    Medical: Not on file    Non-medical: Not on file  Tobacco Use  . Smoking status: Former Smoker    Packs/day: 0.50    Types: Cigarettes    Last attempt to quit: 10/07/2001    Years since quitting: 15.8  . Smokeless tobacco: Never Used  Substance and Sexual Activity  . Alcohol use: No    Alcohol/week: 0.0 oz  . Drug use: No  . Sexual activity: Not Currently  Lifestyle  . Physical activity:    Days per week: Not on file    Minutes per session: Not on file  . Stress: Not on file  Relationships  . Social connections:    Talks on phone: Not on file    Gets together: Not on file    Attends religious service: Not on file    Active member of club or organization: Not on file    Attends meetings of clubs or organizations: Not on file    Relationship status: Not on file  . Intimate partner violence:    Fear of current or ex partner: Not on file    Emotionally abused: Not on file    Physically abused: Not on file    Forced sexual activity: Not on file  Other Topics Concern  . Not on file  Social History Narrative  . Not on file   Current Outpatient Medications on File Prior to Visit  Medication Sig Dispense Refill  . albuterol (PROVENTIL HFA;VENTOLIN HFA) 108 (90 Base) MCG/ACT inhaler Inhale 2 puffs into the lungs every 6 (six) hours as needed for wheezing or shortness of breath. 1 Inhaler 2  . aspirin 81 MG chewable tablet Chew 1 tablet (81 mg total) by mouth daily. 90 tablet 3  . clopidogrel (PLAVIX) 75 MG tablet Take 75 mg by mouth daily.    . Coenzyme Q10 (CO Q 10) 100 MG CAPS Take 100 mg by mouth daily. 30 capsule 12  . hydrochlorothiazide (MICROZIDE) 12.5 MG capsule Take 12.5 mg by mouth daily.    . isosorbide mononitrate (IMDUR) 30 MG 24 hr tablet Take 2 tablets (60 mg total) by mouth daily. (Patient taking differently: Take 30 mg by mouth 2 (two) times daily. ) 60 tablet 12   . loperamide (IMODIUM) 2 MG capsule Take by mouth as needed for diarrhea or loose stools.    . metFORMIN (GLUCOPHAGE) 1000 MG tablet Take 1 tablet (1,000 mg total)  by mouth 2 (two) times daily with a meal. 180 tablet 3  . metoprolol tartrate (LOPRESSOR) 50 MG tablet TAKE 1 TABLET BY MOUTH TWICE A DAY FOR HIGH BLOOD PRESSURE 60 tablet 12  . nitroGLYCERIN (NITROSTAT) 0.4 MG SL tablet Place 1 tablet (0.4 mg total) under the tongue every 5 (five) minutes as needed for chest pain. 30 tablet 0  . ondansetron (ZOFRAN-ODT) 8 MG disintegrating tablet DISSOLVE 1 TABLET ON THE TONGUE EVERY 8 HOURS AS NEEDED FOR NAUSEA OR VOMITING 30 tablet 3  . rosuvastatin (CRESTOR) 10 MG tablet Take 1 tablet (10 mg total) by mouth daily. 90 tablet 3  . tiZANidine (ZANAFLEX) 2 MG tablet Take 2 mg by mouth at bedtime.      No current facility-administered medications on file prior to visit.    Allergies  Allergen Reactions  . Lisinopril     cough  . Penicillins Swelling, Rash and Other (See Comments)    Has patient had a PCN reaction causing immediate rash, facial/tongue/throat swelling, SOB or lightheadedness with hypotension: Yes Has patient had a PCN reaction causing severe rash involving mucus membranes or skin necrosis: Yes Has patient had a PCN reaction that required hospitalization No Has patient had a PCN reaction occurring within the last 10 years: Yes If all of the above answers are "NO", then may proceed with Cephalosporin use.      Review of Systems Constitutional: No recent fever/chills/sweats Respiratory: No recent cough/bronchitis Cardiovascular: No chest pain Gastrointestinal: No recent nausea/vomiting/diarrhea Genitourinary: No UTI symptoms Hematologic/lymphatic:No history of coagulopathy or recent blood thinner use    Objective:   Blood pressure 98/66, pulse 90, weight 172 lb 11.2 oz (78.3 kg). Body mass index is 30.84 kg/m.  CONSTITUTIONAL: Well-developed, well-nourished female in no  acute distress.  HENT:  Normocephalic, atraumatic, External right and left ear normal. Oropharynx is clear and moist EYES: Conjunctivae and EOM are normal. Pupils are equal, round, and reactive to light. No scleral icterus.  NECK: Normal range of motion, supple, no masses SKIN: Skin is warm and dry. No rash noted. Not diaphoretic. No erythema. No pallor. NEUROLOGIC: Alert and oriented to person, place, and time. Normal reflexes, muscle tone coordination. No cranial nerve deficit noted. PSYCHIATRIC: Normal mood and affect. Normal behavior. Normal judgment and thought content. CARDIOVASCULAR: Normal heart rate noted, regular rhythm RESPIRATORY: Effort and breath sounds normal, no problems with respiration noted ABDOMEN: Soft, nontender, nondistended.  Well-healed infraumbilical midline scar.  PELVIC: Deferred MUSCULOSKELETAL: Normal range of motion. No edema and no tenderness. 2+ distal pulses.    Labs: Results for orders placed or performed during the hospital encounter of 08/10/17 (from the past 336 hour(s))  CBC   Collection Time: 08/10/17 12:21 PM  Result Value Ref Range   WBC 6.2 3.6 - 11.0 K/uL   RBC 4.09 3.80 - 5.20 MIL/uL   Hemoglobin 13.1 12.0 - 16.0 g/dL   HCT 38.2 35.0 - 47.0 %   MCV 93.3 80.0 - 100.0 fL   MCH 32.0 26.0 - 34.0 pg   MCHC 34.3 32.0 - 36.0 g/dL   RDW 13.7 11.5 - 14.5 %   Platelets 240 150 - 440 K/uL  Comprehensive metabolic panel   Collection Time: 08/10/17 12:21 PM  Result Value Ref Range   Sodium 140 135 - 145 mmol/L   Potassium 3.3 (L) 3.5 - 5.1 mmol/L   Chloride 101 101 - 111 mmol/L   CO2 27 22 - 32 mmol/L   Glucose, Bld 206 (H) 65 -  99 mg/dL   BUN 19 6 - 20 mg/dL   Creatinine, Ser 0.79 0.44 - 1.00 mg/dL   Calcium 9.7 8.9 - 10.3 mg/dL   Total Protein 7.9 6.5 - 8.1 g/dL   Albumin 4.4 3.5 - 5.0 g/dL   AST 20 15 - 41 U/L   ALT 11 (L) 14 - 54 U/L   Alkaline Phosphatase 89 38 - 126 U/L   Total Bilirubin 0.5 0.3 - 1.2 mg/dL   GFR calc non Af Amer >60  >60 mL/min   GFR calc Af Amer >60 >60 mL/min   Anion gap 12 5 - 15  Type and screen   Collection Time: 08/10/17 12:21 PM  Result Value Ref Range   ABO/RH(D) O POS    Antibody Screen NEG    Sample Expiration 08/24/2017    Extend sample reason      NO TRANSFUSIONS OR PREGNANCY IN THE PAST 3 MONTHS Performed at [facility], [address], [place], [place] [address]      Imaging Studies: Dg Chest 2 View  Result Date: 07/26/2017 CLINICAL DATA:  Chest pain starting around 11 30 this morning. Radiates to the left arm, abdomen, and back. Diaphoresis and nausea. Previous history of heart disease. EXAM: CHEST - 2 VIEW COMPARISON:  12/23/2016 FINDINGS: Postoperative changes in the mediastinum. Shallow inspiration. Heart size and pulmonary vascularity are normal. Lungs appear clear and expanded. No blunting of costophrenic angles. No pneumothorax. Mediastinal contours appear intact. Mild degenerative changes in the spine and shoulders. Calcification of the aorta. IMPRESSION: No evidence of active pulmonary disease.  Aortic atherosclerosis. Electronically Signed   By: Lucienne Capers M.D.   On: 07/26/2017 22:52    ULTRASOUND REPORT  Location: [facility] Date of Service:  06/22/2017   Indications: Fibroids Findings:  The uterus measures 11.0 x 8.2 x 7.8 cm. Echo texture is heterogeneous with evidence of focal masses. Within the uterus are multiple suspected fibroids measuring: Fibroid 1: 7.0 x 7.6 x 6.3 cm, Appears to be filling entire uterus. The Endometrium is unable to be delineated due to the presence of fibroid.  Neither ovary is visualized due to the presence of fibroid. Survey of the adnexa demonstrates no adnexal masses. There is no free fluid in the cul de sac.  Impression: 1. Anteverted fibroid uterus appears enlarged at 11.0 x 8.2 x 7.8 cm. 2. Large fibroid measuring 7.0 x 7.6 x 6.3 cm appears to be filling entire  uterus.  Recommendations: 1.Clinical correlation with the patient's History and Physical Exam.   Dario Ave, RDMS  The ultrasound images and findings were reviewed by me and I agree with the above report.   Assessment:    Pelvic pain Fibroid uterus CAD Sleep apnea H/o Asthma DM   Plan:   - Counseling: Procedure, risks, reasons, benefits and complications (including injury to bowel, bladder, major blood vessel, ureter, bleeding, possibility of transfusion, infection, or fistula formation) reviewed in detail. Likelihood of success in alleviating the patient's condition was discussed. Routine postoperative instructions will be reviewed with the patient and her family in detail after surgery.  The patient concurred with the proposed plan, giving informed written consent for the surgery.   - Patient has had clearance from Cardiologist and PCP for surgery.  - Sleep apnea, will need to bring sleep mask if applicable.  - Patient to d/c aspirin use today, and will need to d/c Plavix in 1-2 days. Will resume 24 hrs post-op. Will use low dose Lovenox and  SCDs for DVT prophylaxis.  Preop testing ordered. Instructions reviewed, including NPO after midnight.     Rubie Maid, MD [facility]

## 2017-08-11 NOTE — Telephone Encounter (Signed)
Candace [name] left a message yesterday saying that she has a hysterectomy scheduled for next week and will need to out for 6 weeks afterwards.  I called her this morning requesting her to call back to discuss options for rehab.

## 2017-08-11 NOTE — Progress Notes (Signed)
GYNECOLOGY PROGRESS NOTE  Subjective:    Patient ID: Candace [name], female    DOB: 11/23/1950, 67 y.o.   MRN: [id number]  HPI  Patient is a 67 y.o. [id number] female who presents for pre-operative examination.  She is currently scheduled for TAH/BSO for pelvic pain and enlarged fibroid uterus. Patient notes that she is having pelvic pain that is radiating now to her left leg. Desires something for pain as she has run out of her pain medication.   Patient has had visits with both her PCP, Pulmonologist and Cardiologist, who are ok with patient having surgery.     The following portions of the patient's history were reviewed and updated as appropriate: allergies, current medications, past family history, past medical history, past social history, past surgical history and problem list.  Review of Systems A comprehensive review of systems was negative except for: Integument/breast: positive for breast tenderness (near left axillary region). Pain has been intermittent, x 2 weeks, feels sharp.   Objective:   Blood pressure 98/66, pulse 90, weight 172 lb 11.2 oz (78.3 kg). General appearance: alert and no distress  Breasts: breasts appear normal, no suspicious masses, no skin or nipple changes or axillary nodes. Abdomen: soft, palpable mass ~ 14-16 weeks, arising from the pelvis, mobile. Mildly tender in LLQ. Well-healed vertical infraumbilical incision  Pelvic: deferred See H&P for remainder of exam.    Imaging:  ULTRASOUND REPORT  Location: [facility] Date of Service:  06/22/2017   Indications: Fibroids Findings:  The uterus measures 11.0 x 8.2 x 7.8 cm. Echo texture is heterogeneous with evidence of focal masses. Within the uterus are multiple suspected fibroids measuring: Fibroid 1: 7.0 x 7.6 x 6.3 cm, Appears to be filling entire uterus. The Endometrium is unable to be delineated due to the presence of fibroid.  Neither ovary is visualized due to the presence  of fibroid. Survey of the adnexa demonstrates no adnexal masses. There is no free fluid in the cul de sac.  Impression: 1. Anteverted fibroid uterus appears enlarged at 11.0 x 8.2 x 7.8 cm. 2. Large fibroid measuring 7.0 x 7.6 x 6.3 cm appears to be filling entire uterus.  Recommendations: 1.Clinical correlation with the patient's History and Physical Exam.   Dario Ave, RDMS     Assessment:   Pelvic pain Fibroid uterus CAD Sleep apnea H/o Asthma DM  Breast pain  Plan:   Patient desires surgical management with hysterectomy (TAH/BSO).  The risks of surgery were discussed in detail with the patient including but not limited to: bleeding which may require transfusion or reoperation; infection which may require prolonged hospitalization or re-hospitalization and antibiotic therapy; injury to bowel, bladder, ureters and major vessels or other surrounding organs; need for additional procedures including laparotomy; thromboembolic phenomenon, incisional problems and other postoperative or anesthesia complications.  Patient was told that the likelihood that her condition and symptoms will be treated effectively with this surgical management was very high; the postoperative expectations were also discussed in detail. The patient also understands the alternative treatment options which were discussed in full. All questions were answered.  She has a preadmission appointment today immediately after this visit, and she will be informed  regarding the time and date of her surgery; routine preoperative instructions of having nothing to eat or drink after midnight on the day prior to surgery and also coming to the hospital 1.5 hours prior to her time of surgery were also emphasized.  She was told she may  be called for a preoperative appointment about a week prior to surgery and will be given further preoperative instructions at that visit. Printed patient education handouts about the procedure were  given to the patient to review at home. She is scheduled for 08/15/2017.  - Patient has had clearance from Cardiologist and PCP for surgery.  - Sleep apnea, will need to bring sleep mask if applicable.  - Patient to d/c aspirin use today, and will need to d/c Plavix in 1-2 days. Will resume 24 hrs post-op. Will use low dose Lovenox and SCDs for DVT prophylaxis.  - Preop testing ordered. - Cell savers will be available due to size of uterus to reduce potential blood loss.  - Breast pain, patient is overdue for a mammogram.  She will need to have completed in light of recent symptoms. No evidence of masses on today's exam.    Rubie Maid, MD [facility]

## 2017-08-11 NOTE — Progress Notes (Signed)
Discharge Progress Report  Patient Details  Name: Candace [name] MRN: [id number] Date of Birth: 1950/07/13 Referring Provider:     Cardiac Rehab from 06/02/2017 in [facility]  Referring Provider  Rosanna Randy       Number of Visits: 29/36  Reason for Discharge:  Patient reached a stable level of exercise. Early Exit:  Hysterectomy Surgery  Smoking History:  Social History   Tobacco Use  Smoking Status Former Smoker  . Packs/day: 0.50  . Types: Cigarettes  . Last attempt to quit: 10/07/2001  . Years since quitting: 15.8  Smokeless Tobacco Never Used    Diagnosis:  Chronic stable angina (HCC)  ADL UCSD:   Initial Exercise Prescription: Initial Exercise Prescription - 06/02/17 1400      Date of Initial Exercise RX and Referring Provider   Date  06/02/17    Referring Provider  Rosanna Randy      Recumbant Bike   Level  2    RPM  60    Watts  28    Minutes  15    METs  3      NuStep   Level  3    SPM  80    Minutes  15    METs  3      Track   Laps  45    Minutes  15    METs  3      Prescription Details   Frequency (times per week)  3    Duration  Progress to 30 minutes of continuous aerobic without signs/symptoms of physical distress      Intensity   THRR 40-80% of Max Heartrate  112-140    Ratings of Perceived Exertion  11-13    Perceived Dyspnea  0-4      Resistance Training   Training Prescription  Yes    Weight  3 lb    Reps  10-15       Discharge Exercise Prescription (Final Exercise Prescription Changes): Exercise Prescription Changes - 08/09/17 1400      Response to Exercise   Blood Pressure (Admit)  124/76    Blood Pressure (Exercise)  198/82    Blood Pressure (Exit)  114/70    Heart Rate (Admit)  92 bpm    Heart Rate (Exercise)  125 bpm    Heart Rate (Exit)  88 bpm    Rating of Perceived Exertion (Exercise)  12    Symptoms  none    Duration  Continue with 30 min of aerobic exercise without signs/symptoms of physical  distress.    Intensity  THRR unchanged      Progression   Progression  Continue to progress workloads to maintain intensity without signs/symptoms of physical distress.    Average METs  2.88      Resistance Training   Training Prescription  Yes    Weight  3 lbs    Reps  10-15      Interval Training   Interval Training  No      Recumbant Bike   Level  3    Watts  32    Minutes  15    METs  3.18      NuStep   Level  3    Minutes  15    METs  3.1      Track   Laps  30    Minutes  15    METs  2.38      Home Exercise  Plan   Plans to continue exercise at  Home (comment) walk and dance    Frequency  Add 3 additional days to program exercise sessions.    Initial Home Exercises Provided  06/16/17       Functional Capacity: 6 Minute Walk    Row Name 06/02/17 1427 08/04/17 0943       6 Minute Walk   Phase  -  Discharge    Distance  942 feet  1224 feet    Distance % Change  -  30 %    Distance Feet Change  -  283 ft    Walk Time  6 minutes  6 minutes    # of Rest Breaks  0  0    MPH  1.78  2.32    METS  3.14  3.6    RPE  15  11    Perceived Dyspnea   2  1    VO2 Peak  10.99  12.6    Symptoms  Yes (comment)  Yes (comment)    Comments  legs fatigued  calves tight 3/10 (claudication)    Resting HR  84 bpm  51 bpm    Resting BP  120/80  124/76    Resting Oxygen Saturation   96 %  -    Exercise Oxygen Saturation  during 6 min walk  99 %  -    Max Ex. HR  128 bpm  122 bpm    Max Ex. BP  192/104  198/82    2 Minute Post BP  148/74  -       Psychological, QOL, Others - Outcomes: PHQ 2/9: Depression screen [facility] 2/9 08/04/2017 06/02/2017 04/25/2017 11/04/2016 07/28/2016  Decreased Interest 1 2 0 0 3  Down, Depressed, Hopeless 0 1 0 0 0  PHQ - 2 Score 1 3 0 0 3  Altered sleeping 3 3 3  0 0  Tired, decreased energy 2 2 3  0 3  Change in appetite 0 2 2 0 3  Feeling bad or failure about yourself  0 1 0 0 0  Trouble concentrating 0 1 0 0 0  Moving slowly or fidgety/restless 1 2  0 0 0  Suicidal thoughts 0 0 2 - 0  PHQ-9 Score 7 14 10  0 9  Difficult doing work/chores Not difficult at all Not difficult at all Somewhat difficult - -    Quality of Life: Quality of Life - 08/04/17 0848      Quality of Life Scores   Health/Function Pre  9.47 %    Health/Function Post  18.5 %    Health/Function % Change  95.35 %    Socioeconomic Pre  18.58 %    Socioeconomic Post  22.21 %    Socioeconomic % Change   19.54 %    Psych/Spiritual Pre  18.86 %    Psych/Spiritual Post  24 %    Psych/Spiritual % Change  27.25 %    Family Pre  21.9 %    Family Post  29.38 %    Family % Change  34.16 %    GLOBAL Pre  15 %    GLOBAL Post  21.77 %    GLOBAL % Change  45.13 %       Personal Goals: Goals established at orientation with interventions provided to work toward goal. Personal Goals and Risk Factors at Admission - 06/02/17 1341      Core Components/Risk Factors/Patient Goals on Admission  Weight Management  Yes;Obesity    Intervention  Weight Management: Develop a combined nutrition and exercise program designed to reach desired caloric intake, while maintaining appropriate intake of nutrient and fiber, sodium and fats, and appropriate energy expenditure required for the weight goal.    Admit Weight  173 lb (78.5 kg)    Goal Weight: Short Term  171 lb (77.6 kg)    Goal Weight: Long Term  159 lb (72.1 kg)    Expected Outcomes  Short Term: Continue to assess and modify interventions until short term weight is achieved;Long Term: Adherence to nutrition and physical activity/exercise program aimed toward attainment of established weight goal;Weight Loss: Understanding of general recommendations for a balanced deficit meal plan, which promotes 1-2 lb weight loss per week and includes a negative energy balance of [phone] kcal/d    Diabetes  Yes    Intervention  Provide education about signs/symptoms and action to take for hypo/hyperglycemia.;Provide education about proper  nutrition, including hydration, and aerobic/resistive exercise prescription along with prescribed medications to achieve blood glucose in normal ranges: Fasting glucose 65-99 mg/dL    Expected Outcomes  Short Term: Participant verbalizes understanding of the signs/symptoms and immediate care of hyper/hypoglycemia, proper foot care and importance of medication, aerobic/resistive exercise and nutrition plan for blood glucose control.;Long Term: Attainment of HbA1C < 7%.    Hypertension  Yes    Intervention  Provide education on lifestyle modifcations including regular physical activity/exercise, weight management, moderate sodium restriction and increased consumption of fresh fruit, vegetables, and low fat dairy, alcohol moderation, and smoking cessation.;Monitor prescription use compliance.    Expected Outcomes  Short Term: Continued assessment and intervention until BP is < 140/10mm HG in hypertensive participants. < 130/58mm HG in hypertensive participants with diabetes, heart failure or chronic kidney disease.;Long Term: Maintenance of blood pressure at goal levels.    Lipids  Yes    Intervention  Provide education and support for participant on nutrition & aerobic/resistive exercise along with prescribed medications to achieve LDL 70mg , HDL >40mg .    Expected Outcomes  Short Term: Participant states understanding of desired cholesterol values and is compliant with medications prescribed. Participant is following exercise prescription and nutrition guidelines.;Long Term: Cholesterol controlled with medications as prescribed, with individualized exercise RX and with personalized nutrition plan. Value goals: LDL < 70mg , HDL > 40 mg.        Personal Goals Discharge: Goals and Risk Factor Review    Row Name 06/30/17 1025 07/28/17 0859           Core Components/Risk Factors/Patient Goals Review   Personal Goals Review  -  Weight Management/Obesity;Hypertension;Diabetes;Lipids;Heart Failure       Review  Wendy is going to add on a Cardiac Rehab day since she will need a hysterectomy the first part of May. Yen has only completed like 6/36 sessions of Cardiac rehab so far attending 2 days/week. Her highest blood sugars tha she reports is 172. Her blood pressure upon arrival to Cardiac REhab was 126/80.   Inaara was in ED on Tuesday for atypical chest pain. She rulled out for MI, but she went as she just wasn't feeling good and NTG did not relieve pain.  She was sent home for follow up as outpatient.  Her blood pressure run up and down but they are still working on adjusting her medications.  She has been checking her pressures at home frequently.  Her weight was 174 today and she thinks its fluid.  She does report some  swelling in her feet.  We talked about ways to help alleviate fluid retention in feet and pain (ie sitting with feet up, sleeping with feet up, compresssion stockings).  She is going to take her extra fluid pill today to try to help as well.  And if her weight is not back down she is going to call the doctor.   Her blood sugars have been good.       Expected Outcomes  Giavonni is going to cont to exercise and attend Cardiac Rehab until she needs her hysterectomy.   Short: Schedule follow up with doctor from ED visit and elevated fluid levels.  Long: Continue to weigh daily and monitor heart failure.          Exercise Goals and Review: Exercise Goals    Row Name 06/02/17 1426             Exercise Goals   Increase Physical Activity  Yes       Intervention  Provide advice, education, support and counseling about physical activity/exercise needs.;Develop an individualized exercise prescription for aerobic and resistive training based on initial evaluation findings, risk stratification, comorbidities and participant's personal goals.       Expected Outcomes  Short Term: Attend rehab on a regular basis to increase amount of physical activity.;Long Term: Add in home exercise to make exercise part  of routine and to increase amount of physical activity.;Long Term: Exercising regularly at least 3-5 days a week.       Increase Strength and Stamina  Yes       Intervention  Provide advice, education, support and counseling about physical activity/exercise needs.;Develop an individualized exercise prescription for aerobic and resistive training based on initial evaluation findings, risk stratification, comorbidities and participant's personal goals.       Expected Outcomes  Short Term: Increase workloads from initial exercise prescription for resistance, speed, and METs.;Short Term: Perform resistance training exercises routinely during rehab and add in resistance training at home;Long Term: Improve cardiorespiratory fitness, muscular endurance and strength as measured by increased METs and functional capacity (6MWT)       Able to understand and use rate of perceived exertion (RPE) scale  Yes       Intervention  Provide education and explanation on how to use RPE scale       Expected Outcomes  Short Term: Able to use RPE daily in rehab to express subjective intensity level;Long Term:  Able to use RPE to guide intensity level when exercising independently       Able to understand and use Dyspnea scale  Yes       Intervention  Provide education and explanation on how to use Dyspnea scale       Expected Outcomes  Short Term: Able to use Dyspnea scale daily in rehab to express subjective sense of shortness of breath during exertion;Long Term: Able to use Dyspnea scale to guide intensity level when exercising independently       Knowledge and understanding of Target Heart Rate Range (THRR)  Yes       Intervention  Provide education and explanation of THRR including how the numbers were predicted and where they are located for reference       Expected Outcomes  Short Term: Able to state/look up THRR;Long Term: Able to use THRR to govern intensity when exercising independently;Short Term: Able to use daily as  guideline for intensity in rehab       Able to check pulse independently  Yes       Intervention  Provide education and demonstration on how to check pulse in carotid and radial arteries.;Review the importance of being able to check your own pulse for safety during independent exercise       Expected Outcomes  Short Term: Able to explain why pulse checking is important during independent exercise;Long Term: Able to check pulse independently and accurately       Understanding of Exercise Prescription  Yes       Intervention  Provide education, explanation, and written materials on patient's individual exercise prescription       Expected Outcomes  Short Term: Able to explain program exercise prescription;Long Term: Able to explain home exercise prescription to exercise independently          Nutrition & Weight - Outcomes: Pre Biometrics - 06/02/17 1425      Pre Biometrics   Height  5' 2.75" (1.594 m)    Weight  173 lb 8 oz (78.7 kg)    Waist Circumference  38 inches    Hip Circumference  42.5 inches    Waist to Hip Ratio  0.89 %    BMI (Calculated)  30.97    Single Leg Stand  4.26 seconds      Post Biometrics - 08/04/17 0944       Post  Biometrics   Height  5' 2.75" (1.594 m)    Weight  174 lb (78.9 kg)    Waist Circumference  38 inches    Hip Circumference  42 inches    Waist to Hip Ratio  0.9 %    BMI (Calculated)  31.06    Single Leg Stand  4.07 seconds       Nutrition: Nutrition Therapy & Goals - 06/09/17 0957      Nutrition Therapy   Diet  TLC/ DASH    Protein (specify units)  11oz    Fiber  25 grams    Whole Grain Foods  3 servings    Saturated Fats  14 max. grams    Fruits and Vegetables  4 servings/day 8 ideal    Sodium  1500 grams      Personal Nutrition Goals   Nutrition Goal  Consume sources of protein more regularly, ideally daily she agrees to drink more milk or cheese    Personal Goal #2  Consume Glucerna nutritonal drinks more regularly, use as a meal  replacement or snack between meals eats 1-2 meals/day typically    Personal Goal #3  Eat foods that have a stong smell such as soups or that have strong flavorings such as citrus or salt-free herbs/ spices    Comments  patient c/o decreased appetite and taste changes. she has made changes to her diet in the past year including eating more fruits and vegetables, decreasing her sodium intake, and eating less fried foods/ red meats      Intervention Plan   Intervention  Prescribe, educate and counsel regarding individualized specific dietary modifications aiming towards targeted core components such as weight, hypertension, lipid management, diabetes, heart failure and other comorbidities.    Expected Outcomes  Short Term Goal: Understand basic principles of dietary content, such as calories, fat, sodium, cholesterol and nutrients.;Short Term Goal: A plan has been developed with personal nutrition goals set during dietitian appointment.;Long Term Goal: Adherence to prescribed nutrition plan.       Nutrition Discharge: Nutrition Assessments - 08/04/17 0847      MEDFICTS Scores   Pre Score  10    Post Score  15    Score Difference  5       Education Questionnaire Score: Knowledge Questionnaire Score - 08/04/17 0847      Knowledge Questionnaire Score   Post Score  23/28       Goals reviewed with patient; copy given to patient.

## 2017-08-12 ENCOUNTER — Other Ambulatory Visit: Payer: Self-pay

## 2017-08-12 MED ORDER — POTASSIUM CHLORIDE CRYS ER 20 MEQ PO TBCR: 40.0000 meq | EXTENDED_RELEASE_TABLET | Freq: Every day | ORAL | 0 refills | Status: DC

## 2017-08-12 NOTE — Telephone Encounter (Signed)
Pt was called and informed that her potassium levels were low and she would need to take 40 meg of K-Dur daily for 3 days prior to her surgery. Pt was informed that the prescription was sent to the pharmacy.

## 2017-08-14 MED ORDER — CLINDAMYCIN PHOSPHATE 900 MG/50ML IV SOLN
900.0000 mg | INTRAVENOUS | Status: AC
  Administered 2017-08-15: 900 mg via INTRAVENOUS

## 2017-08-14 MED ORDER — GENTAMICIN SULFATE 40 MG/ML IJ SOLN
390.0000 mg | INTRAVENOUS | Status: AC
  Administered 2017-08-15: 390 mg via INTRAVENOUS
  Filled 2017-08-14: qty 9.75

## 2017-08-14 MED ORDER — GENTAMICIN SULFATE 40 MG/ML IJ SOLN
INTRAVENOUS | Status: DC
  Filled 2017-08-14: qty 9.75

## 2017-08-15 ENCOUNTER — Encounter: Payer: Self-pay | Admitting: Certified Registered"

## 2017-08-15 ENCOUNTER — Encounter
Admission: RE | Disposition: A | Discharge status: Home / Self Care | Payer: Self-pay | Source: Ambulatory Visit | Attending: Obstetrics and Gynecology

## 2017-08-15 ENCOUNTER — Inpatient Hospital Stay: Payer: Medicare Other | Admitting: Certified Registered"

## 2017-08-15 ENCOUNTER — Inpatient Hospital Stay
Admission: RE | Admit: 2017-08-15 | Discharge: 2017-08-18 | DRG: 742 | Disposition: A | Discharge status: Home / Self Care | Payer: Medicare Other | Source: Ambulatory Visit | Attending: Obstetrics and Gynecology | Admitting: Obstetrics and Gynecology

## 2017-08-15 ENCOUNTER — Other Ambulatory Visit: Payer: Self-pay

## 2017-08-15 DIAGNOSIS — D250 Submucous leiomyoma of uterus: Secondary | ICD-10-CM | POA: Diagnosis not present

## 2017-08-15 DIAGNOSIS — Z951 Presence of aortocoronary bypass graft: Secondary | ICD-10-CM

## 2017-08-15 DIAGNOSIS — G4733 Obstructive sleep apnea (adult) (pediatric): Secondary | ICD-10-CM | POA: Diagnosis present

## 2017-08-15 DIAGNOSIS — I25110 Atherosclerotic heart disease of native coronary artery with unstable angina pectoris: Secondary | ICD-10-CM | POA: Diagnosis present

## 2017-08-15 DIAGNOSIS — I25708 Atherosclerosis of coronary artery bypass graft(s), unspecified, with other forms of angina pectoris: Secondary | ICD-10-CM

## 2017-08-15 DIAGNOSIS — Z880 Allergy status to penicillin: Secondary | ICD-10-CM

## 2017-08-15 DIAGNOSIS — I10 Essential (primary) hypertension: Secondary | ICD-10-CM | POA: Diagnosis present

## 2017-08-15 DIAGNOSIS — Z7984 Long term (current) use of oral hypoglycemic drugs: Secondary | ICD-10-CM

## 2017-08-15 DIAGNOSIS — D259 Leiomyoma of uterus, unspecified: Secondary | ICD-10-CM | POA: Diagnosis present

## 2017-08-15 DIAGNOSIS — Z87891 Personal history of nicotine dependence: Secondary | ICD-10-CM | POA: Diagnosis not present

## 2017-08-15 DIAGNOSIS — Z9889 Other specified postprocedural states: Secondary | ICD-10-CM | POA: Diagnosis not present

## 2017-08-15 DIAGNOSIS — E669 Obesity, unspecified: Secondary | ICD-10-CM | POA: Diagnosis present

## 2017-08-15 DIAGNOSIS — F418 Other specified anxiety disorders: Secondary | ICD-10-CM | POA: Diagnosis present

## 2017-08-15 DIAGNOSIS — I2510 Atherosclerotic heart disease of native coronary artery without angina pectoris: Secondary | ICD-10-CM | POA: Diagnosis present

## 2017-08-15 DIAGNOSIS — E1159 Type 2 diabetes mellitus with other circulatory complications: Secondary | ICD-10-CM | POA: Diagnosis present

## 2017-08-15 DIAGNOSIS — D62 Acute posthemorrhagic anemia: Secondary | ICD-10-CM | POA: Diagnosis not present

## 2017-08-15 DIAGNOSIS — Z7902 Long term (current) use of antithrombotics/antiplatelets: Secondary | ICD-10-CM | POA: Diagnosis not present

## 2017-08-15 DIAGNOSIS — Z7982 Long term (current) use of aspirin: Secondary | ICD-10-CM

## 2017-08-15 DIAGNOSIS — J4520 Mild intermittent asthma, uncomplicated: Secondary | ICD-10-CM | POA: Diagnosis present

## 2017-08-15 DIAGNOSIS — Z6830 Body mass index (BMI) 30.0-30.9, adult: Secondary | ICD-10-CM

## 2017-08-15 DIAGNOSIS — Z955 Presence of coronary angioplasty implant and graft: Secondary | ICD-10-CM | POA: Diagnosis not present

## 2017-08-15 DIAGNOSIS — R4182 Altered mental status, unspecified: Secondary | ICD-10-CM | POA: Diagnosis not present

## 2017-08-15 DIAGNOSIS — R102 Pelvic and perineal pain: Secondary | ICD-10-CM | POA: Diagnosis present

## 2017-08-15 DIAGNOSIS — Z90710 Acquired absence of both cervix and uterus: Secondary | ICD-10-CM

## 2017-08-15 DIAGNOSIS — Z8673 Personal history of transient ischemic attack (TIA), and cerebral infarction without residual deficits: Secondary | ICD-10-CM

## 2017-08-15 DIAGNOSIS — I200 Unstable angina: Secondary | ICD-10-CM

## 2017-08-15 DIAGNOSIS — T4145XA Adverse effect of unspecified anesthetic, initial encounter: Secondary | ICD-10-CM | POA: Diagnosis not present

## 2017-08-15 DIAGNOSIS — J45909 Unspecified asthma, uncomplicated: Secondary | ICD-10-CM | POA: Diagnosis present

## 2017-08-15 HISTORY — PX: HYSTERECTOMY ABDOMINAL WITH SALPINGO-OOPHORECTOMY: SHX6792

## 2017-08-15 LAB — URINALYSIS, COMPLETE (UACMP) WITH MICROSCOPIC
Bacteria, UA: NONE SEEN
Bilirubin Urine: NEGATIVE
Glucose, UA: >=500 mg/dL — AB
Ketones, ur: 20 mg/dL — AB
Leukocytes, UA: NEGATIVE
Nitrite: NEGATIVE
Protein, ur: 30 mg/dL — AB
RBC / HPF: >50 RBC/hpf — ABNORMAL HIGH (ref 0–5)
Specific Gravity, Urine: 1.02 (ref 1.005–1.030)
Squamous Epithelial / LPF: NONE SEEN (ref 0–5)
pH: 6 (ref 5.0–8.0)

## 2017-08-15 LAB — BASIC METABOLIC PANEL
Anion gap: 11 (ref 5–15)
BUN: 14 mg/dL (ref 6–20)
CO2: 22 mmol/L (ref 22–32)
Calcium: 8.5 mg/dL — ABNORMAL LOW (ref 8.9–10.3)
Chloride: 105 mmol/L (ref 101–111)
Creatinine, Ser: 0.69 mg/dL (ref 0.44–1.00)
GFR calc Af Amer: >60 mL/min (ref >60)
GFR calc non Af Amer: >60 mL/min (ref >60)
Glucose, Bld: 256 mg/dL — ABNORMAL HIGH (ref 65–99)
Potassium: 3.7 mmol/L (ref 3.5–5.1)
Sodium: 138 mmol/L (ref 135–145)

## 2017-08-15 LAB — POCT I-STAT 4, (NA,K, GLUC, HGB,HCT)
Glucose, Bld: 131 mg/dL — ABNORMAL HIGH (ref 65–99)
HCT: 35 % — ABNORMAL LOW (ref 36.0–46.0)
Hemoglobin: 11.9 g/dL — ABNORMAL LOW (ref 12.0–15.0)
Potassium: 3.7 mmol/L (ref 3.5–5.1)
Sodium: 141 mmol/L (ref 135–145)

## 2017-08-15 LAB — GLUCOSE, CAPILLARY
Glucose-Capillary: 132 mg/dL — ABNORMAL HIGH (ref 65–99)
Glucose-Capillary: 166 mg/dL — ABNORMAL HIGH (ref 65–99)
Glucose-Capillary: 276 mg/dL — ABNORMAL HIGH (ref 65–99)
Glucose-Capillary: 311 mg/dL — ABNORMAL HIGH (ref 65–99)

## 2017-08-15 LAB — CBC
HCT: 36.2 % (ref 35.0–47.0)
Hemoglobin: 12.2 g/dL (ref 12.0–16.0)
MCH: 32.1 pg (ref 26.0–34.0)
MCHC: 33.8 g/dL (ref 32.0–36.0)
MCV: 95 fL (ref 80.0–100.0)
Platelets: 207 10*3/uL (ref 150–440)
RBC: 3.81 MIL/uL (ref 3.80–5.20)
RDW: 13.4 % (ref 11.5–14.5)
WBC: 9.9 10*3/uL (ref 3.6–11.0)

## 2017-08-15 LAB — ABO/RH: ABO/RH(D): O POS

## 2017-08-15 SURGERY — HYSTERECTOMY, ABDOMINAL, WITH SALPINGO-OOPHORECTOMY
Anesthesia: General | Site: Abdomen | Laterality: Bilateral | Wound class: Clean Contaminated

## 2017-08-15 MED ORDER — KETOROLAC TROMETHAMINE 30 MG/ML IJ SOLN
30.0000 mg | Freq: Three times a day (TID) | INTRAMUSCULAR | Status: AC
  Administered 2017-08-15 – 2017-08-16 (×3): 30 mg via INTRAVENOUS
  Filled 2017-08-15 (×3): qty 1

## 2017-08-15 MED ORDER — HYDRALAZINE HCL 20 MG/ML IJ SOLN
INTRAMUSCULAR | Status: AC
  Administered 2017-08-15: 10 mg via INTRAVENOUS
  Filled 2017-08-15: qty 1

## 2017-08-15 MED ORDER — FAMOTIDINE 20 MG PO TABS
ORAL_TABLET | ORAL | Status: AC
  Administered 2017-08-15: 20 mg via ORAL
  Filled 2017-08-15: qty 1

## 2017-08-15 MED ORDER — PHENYLEPHRINE HCL 10 MG/ML IJ SOLN
INTRAMUSCULAR | Status: DC | PRN
  Administered 2017-08-15: 100 ug via INTRAVENOUS
  Administered 2017-08-15: 50 ug via INTRAVENOUS
  Administered 2017-08-15: 100 ug via INTRAVENOUS

## 2017-08-15 MED ORDER — FENTANYL CITRATE (PF) 100 MCG/2ML IJ SOLN
INTRAMUSCULAR | Status: DC | PRN
  Administered 2017-08-15: 100 ug via INTRAVENOUS
  Administered 2017-08-15 (×2): 50 ug via INTRAVENOUS

## 2017-08-15 MED ORDER — LIDOCAINE HCL (CARDIAC) PF 100 MG/5ML IV SOSY
PREFILLED_SYRINGE | INTRAVENOUS | Status: DC | PRN
  Administered 2017-08-15: 60 mg via INTRAVENOUS

## 2017-08-15 MED ORDER — DEXAMETHASONE SODIUM PHOSPHATE 10 MG/ML IJ SOLN
INTRAMUSCULAR | Status: AC
  Filled 2017-08-15: qty 1

## 2017-08-15 MED ORDER — PROMETHAZINE HCL 25 MG/ML IJ SOLN
INTRAMUSCULAR | Status: AC
  Administered 2017-08-15: 12.5 mg via INTRAVENOUS
  Filled 2017-08-15: qty 1

## 2017-08-15 MED ORDER — FLUMAZENIL 0.5 MG/5ML IV SOLN
0.2000 mg | Freq: Once | INTRAVENOUS | Status: AC
  Administered 2017-08-15: 0.2 mg via INTRAVENOUS

## 2017-08-15 MED ORDER — PROMETHAZINE HCL 25 MG/ML IJ SOLN
6.2500 mg | INTRAMUSCULAR | Status: DC | PRN
  Administered 2017-08-15: 12.5 mg via INTRAVENOUS

## 2017-08-15 MED ORDER — PROPOFOL 10 MG/ML IV BOLUS
INTRAVENOUS | Status: AC
  Filled 2017-08-15: qty 20

## 2017-08-15 MED ORDER — FENTANYL CITRATE (PF) 250 MCG/5ML IJ SOLN
INTRAMUSCULAR | Status: AC
  Filled 2017-08-15: qty 5

## 2017-08-15 MED ORDER — INSULIN ASPART 100 UNIT/ML ~~LOC~~ SOLN
0.0000 [IU] | Freq: Three times a day (TID) | SUBCUTANEOUS | Status: DC
  Administered 2017-08-16 (×2): 5 [IU] via SUBCUTANEOUS
  Administered 2017-08-16 – 2017-08-17 (×2): 3 [IU] via SUBCUTANEOUS
  Administered 2017-08-17: 5 [IU] via SUBCUTANEOUS
  Administered 2017-08-17: 2 [IU] via SUBCUTANEOUS
  Administered 2017-08-18: 3 [IU] via SUBCUTANEOUS
  Administered 2017-08-18: 2 [IU] via SUBCUTANEOUS
  Filled 2017-08-15 (×9): qty 1

## 2017-08-15 MED ORDER — METFORMIN HCL 500 MG PO TABS
1000.0000 mg | ORAL_TABLET | Freq: Two times a day (BID) | ORAL | Status: DC
  Administered 2017-08-16 – 2017-08-18 (×4): 1000 mg via ORAL
  Filled 2017-08-15 (×5): qty 2

## 2017-08-15 MED ORDER — ONDANSETRON HCL 4 MG/2ML IJ SOLN
INTRAMUSCULAR | Status: DC | PRN
  Administered 2017-08-15: 4 mg via INTRAVENOUS

## 2017-08-15 MED ORDER — HYDROMORPHONE HCL 1 MG/ML IJ SOLN
0.2000 mg | INTRAMUSCULAR | Status: DC | PRN
  Administered 2017-08-16: 0.6 mg via INTRAVENOUS
  Administered 2017-08-17: 0.2 mg via INTRAVENOUS
  Administered 2017-08-17: 0.5 mg via INTRAVENOUS
  Filled 2017-08-15 (×3): qty 1

## 2017-08-15 MED ORDER — SODIUM CHLORIDE FLUSH 0.9 % IV SOLN
INTRAVENOUS | Status: AC
  Filled 2017-08-15: qty 10

## 2017-08-15 MED ORDER — SIMETHICONE 80 MG PO CHEW
80.0000 mg | CHEWABLE_TABLET | Freq: Four times a day (QID) | ORAL | Status: DC | PRN
  Administered 2017-08-16 – 2017-08-17 (×3): 80 mg via ORAL
  Filled 2017-08-15 (×3): qty 1

## 2017-08-15 MED ORDER — FENTANYL CITRATE (PF) 100 MCG/2ML IJ SOLN
25.0000 ug | INTRAMUSCULAR | Status: DC | PRN
  Administered 2017-08-15 (×2): 50 ug via INTRAVENOUS

## 2017-08-15 MED ORDER — FLUMAZENIL 0.5 MG/5ML IV SOLN
INTRAVENOUS | Status: AC
  Administered 2017-08-15: 0.2 mg via INTRAVENOUS
  Filled 2017-08-15: qty 5

## 2017-08-15 MED ORDER — ALUM & MAG HYDROXIDE-SIMETH 200-200-20 MG/5ML PO SUSP: 30.0000 mL | ORAL | Status: DC | PRN

## 2017-08-15 MED ORDER — ACETAMINOPHEN 325 MG PO TABS
650.0000 mg | ORAL_TABLET | ORAL | Status: DC | PRN
  Administered 2017-08-16: 650 mg via ORAL
  Filled 2017-08-15: qty 2

## 2017-08-15 MED ORDER — MAGNESIUM CITRATE PO SOLN
1.0000 | Freq: Once | ORAL | Status: DC | PRN
  Filled 2017-08-15: qty 296

## 2017-08-15 MED ORDER — DIPHENHYDRAMINE HCL 25 MG PO CAPS: 25.0000 mg | ORAL_CAPSULE | Freq: Four times a day (QID) | ORAL | Status: DC | PRN

## 2017-08-15 MED ORDER — DOCUSATE SODIUM 100 MG PO CAPS
100.0000 mg | ORAL_CAPSULE | Freq: Two times a day (BID) | ORAL | Status: DC
  Administered 2017-08-16 – 2017-08-18 (×5): 100 mg via ORAL
  Filled 2017-08-15 (×5): qty 1

## 2017-08-15 MED ORDER — OXYCODONE HCL 5 MG/5ML PO SOLN: 5.0000 mg | Freq: Once | ORAL | Status: DC | PRN

## 2017-08-15 MED ORDER — MIDAZOLAM HCL 5 MG/5ML IJ SOLN
INTRAMUSCULAR | Status: DC | PRN
  Administered 2017-08-15: 2 mg via INTRAVENOUS

## 2017-08-15 MED ORDER — BISACODYL 10 MG RE SUPP: 10.0000 mg | Freq: Every day | RECTAL | Status: DC | PRN

## 2017-08-15 MED ORDER — FAMOTIDINE 20 MG PO TABS
20.0000 mg | ORAL_TABLET | Freq: Once | ORAL | Status: AC
  Administered 2017-08-15: 20 mg via ORAL

## 2017-08-15 MED ORDER — LACTATED RINGERS IV SOLN
INTRAVENOUS | Status: DC
  Administered 2017-08-15: 22:00:00 via INTRAVENOUS

## 2017-08-15 MED ORDER — ROSUVASTATIN CALCIUM 10 MG PO TABS
10.0000 mg | ORAL_TABLET | Freq: Every day | ORAL | Status: DC
  Administered 2017-08-16 – 2017-08-18 (×3): 10 mg via ORAL
  Filled 2017-08-15 (×3): qty 1

## 2017-08-15 MED ORDER — KETOROLAC TROMETHAMINE 30 MG/ML IJ SOLN
INTRAMUSCULAR | Status: DC | PRN
  Administered 2017-08-15: 30 mg via INTRAVENOUS

## 2017-08-15 MED ORDER — CLINDAMYCIN PHOSPHATE 900 MG/50ML IV SOLN
INTRAVENOUS | Status: AC
  Filled 2017-08-15: qty 50

## 2017-08-15 MED ORDER — ACETAMINOPHEN 10 MG/ML IV SOLN
INTRAVENOUS | Status: DC | PRN
  Administered 2017-08-15: 1000 mg via INTRAVENOUS

## 2017-08-15 MED ORDER — HYDRALAZINE HCL 20 MG/ML IJ SOLN
10.0000 mg | Freq: Once | INTRAMUSCULAR | Status: AC
  Administered 2017-08-15: 10 mg via INTRAVENOUS

## 2017-08-15 MED ORDER — MEPERIDINE HCL 50 MG/ML IJ SOLN: 6.2500 mg | INTRAMUSCULAR | Status: DC | PRN

## 2017-08-15 MED ORDER — SODIUM CHLORIDE 0.9 % IV SOLN
INTRAVENOUS | Status: DC
  Administered 2017-08-15 (×2): via INTRAVENOUS

## 2017-08-15 MED ORDER — KETOROLAC TROMETHAMINE 30 MG/ML IJ SOLN
INTRAMUSCULAR | Status: AC
  Filled 2017-08-15: qty 1

## 2017-08-15 MED ORDER — SUGAMMADEX SODIUM 200 MG/2ML IV SOLN
INTRAVENOUS | Status: DC | PRN
  Administered 2017-08-15: 160 mg via INTRAVENOUS

## 2017-08-15 MED ORDER — SENNOSIDES-DOCUSATE SODIUM 8.6-50 MG PO TABS
1.0000 | ORAL_TABLET | Freq: Every evening | ORAL | Status: DC | PRN
  Administered 2017-08-17: 1 via ORAL
  Filled 2017-08-15: qty 1

## 2017-08-15 MED ORDER — OXYCODONE HCL 5 MG PO TABS
5.0000 mg | ORAL_TABLET | Freq: Four times a day (QID) | ORAL | Status: DC | PRN
  Administered 2017-08-16 (×2): 5 mg via ORAL
  Filled 2017-08-15 (×2): qty 1

## 2017-08-15 MED ORDER — ROCURONIUM BROMIDE 100 MG/10ML IV SOLN
INTRAVENOUS | Status: DC | PRN
  Administered 2017-08-15: 10 mg via INTRAVENOUS
  Administered 2017-08-15: 5 mg via INTRAVENOUS
  Administered 2017-08-15: 25 mg via INTRAVENOUS

## 2017-08-15 MED ORDER — ONDANSETRON 4 MG PO TBDP
8.0000 mg | ORAL_TABLET | Freq: Three times a day (TID) | ORAL | Status: DC | PRN
  Administered 2017-08-17: 8 mg via ORAL
  Filled 2017-08-15 (×2): qty 2

## 2017-08-15 MED ORDER — BUPIVACAINE HCL (PF) 0.5 % IJ SOLN
INTRAMUSCULAR | Status: DC | PRN
  Administered 2017-08-15: 10 mL

## 2017-08-15 MED ORDER — OXYCODONE HCL 5 MG PO TABS: 5.0000 mg | ORAL_TABLET | Freq: Once | ORAL | Status: DC | PRN

## 2017-08-15 MED ORDER — SEVOFLURANE IN SOLN
RESPIRATORY_TRACT | Status: AC
  Filled 2017-08-15: qty 250

## 2017-08-15 MED ORDER — BUPIVACAINE 0.25 % ON-Q PUMP DUAL CATH 400 ML
400.0000 mL | INJECTION | Status: DC
  Filled 2017-08-15: qty 400

## 2017-08-15 MED ORDER — MIDAZOLAM HCL 2 MG/2ML IJ SOLN
INTRAMUSCULAR | Status: AC
Start: 2017-08-15 — End: ?
  Filled 2017-08-15: qty 2

## 2017-08-15 MED ORDER — MENTHOL 3 MG MT LOZG
1.0000 | LOZENGE | OROMUCOSAL | Status: DC | PRN
  Filled 2017-08-15: qty 9

## 2017-08-15 MED ORDER — PROPOFOL 10 MG/ML IV BOLUS
INTRAVENOUS | Status: DC | PRN
  Administered 2017-08-15: 150 mg via INTRAVENOUS

## 2017-08-15 MED ORDER — INSULIN ASPART 100 UNIT/ML ~~LOC~~ SOLN
5.0000 [IU] | Freq: Once | SUBCUTANEOUS | Status: AC
  Administered 2017-08-15: 5 [IU] via SUBCUTANEOUS
  Filled 2017-08-15: qty 1

## 2017-08-15 MED ORDER — LIDOCAINE 5 % EX PTCH
1.0000 | MEDICATED_PATCH | CUTANEOUS | Status: DC
  Administered 2017-08-15: 1 via TRANSDERMAL
  Filled 2017-08-15 (×6): qty 1

## 2017-08-15 MED ORDER — ISOSORBIDE MONONITRATE ER 30 MG PO TB24
30.0000 mg | ORAL_TABLET | Freq: Two times a day (BID) | ORAL | Status: DC
  Administered 2017-08-16 – 2017-08-18 (×5): 30 mg via ORAL
  Filled 2017-08-15 (×7): qty 1

## 2017-08-15 MED ORDER — DIPHENHYDRAMINE HCL 50 MG/ML IJ SOLN: 25.0000 mg | Freq: Four times a day (QID) | INTRAMUSCULAR | Status: DC | PRN

## 2017-08-15 MED ORDER — CLOPIDOGREL BISULFATE 75 MG PO TABS
75.0000 mg | ORAL_TABLET | Freq: Every day | ORAL | Status: DC
  Administered 2017-08-16 – 2017-08-18 (×3): 75 mg via ORAL
  Filled 2017-08-15 (×6): qty 1

## 2017-08-15 MED ORDER — ENOXAPARIN SODIUM 40 MG/0.4ML ~~LOC~~ SOLN
40.0000 mg | SUBCUTANEOUS | Status: AC
  Administered 2017-08-15: 40 mg via SUBCUTANEOUS
  Filled 2017-08-15 (×2): qty 0.4

## 2017-08-15 MED ORDER — SUCCINYLCHOLINE CHLORIDE 20 MG/ML IJ SOLN
INTRAMUSCULAR | Status: DC | PRN
  Administered 2017-08-15: 100 mg via INTRAVENOUS

## 2017-08-15 MED ORDER — NITROGLYCERIN 0.4 MG SL SUBL
0.4000 mg | SUBLINGUAL_TABLET | SUBLINGUAL | Status: DC | PRN
  Administered 2017-08-17: 0.4 mg via SUBLINGUAL
  Filled 2017-08-15: qty 1

## 2017-08-15 MED ORDER — METOPROLOL TARTRATE 50 MG PO TABS
50.0000 mg | ORAL_TABLET | Freq: Two times a day (BID) | ORAL | Status: DC
  Administered 2017-08-16 – 2017-08-18 (×3): 50 mg via ORAL
  Filled 2017-08-15 (×6): qty 1

## 2017-08-15 MED ORDER — BUPIVACAINE HCL (PF) 0.5 % IJ SOLN
INTRAMUSCULAR | Status: AC
  Filled 2017-08-15: qty 30

## 2017-08-15 MED ORDER — FENTANYL CITRATE (PF) 100 MCG/2ML IJ SOLN
INTRAMUSCULAR | Status: AC
  Administered 2017-08-15: 50 ug via INTRAVENOUS
  Filled 2017-08-15: qty 2

## 2017-08-15 MED ORDER — DEXAMETHASONE SODIUM PHOSPHATE 10 MG/ML IJ SOLN
INTRAMUSCULAR | Status: DC | PRN
  Administered 2017-08-15: 8 mg via INTRAVENOUS

## 2017-08-15 MED ORDER — HYDRALAZINE HCL 20 MG/ML IJ SOLN
10.0000 mg | Freq: Four times a day (QID) | INTRAMUSCULAR | Status: DC | PRN
  Administered 2017-08-15: 10 mg via INTRAVENOUS
  Filled 2017-08-15: qty 1

## 2017-08-15 MED ORDER — PANTOPRAZOLE SODIUM 40 MG PO TBEC
40.0000 mg | DELAYED_RELEASE_TABLET | Freq: Every day | ORAL | Status: DC
  Administered 2017-08-16 – 2017-08-18 (×3): 40 mg via ORAL
  Filled 2017-08-15 (×3): qty 1

## 2017-08-15 MED ORDER — HYDROCHLOROTHIAZIDE 12.5 MG PO CAPS
25.0000 mg | ORAL_CAPSULE | Freq: Every day | ORAL | Status: DC
  Administered 2017-08-16 – 2017-08-18 (×2): 25 mg via ORAL
  Filled 2017-08-15 (×3): qty 2

## 2017-08-15 SURGICAL SUPPLY — 39 items
BARRIER ADHS 3X4 INTERCEED (GAUZE/BANDAGES/DRESSINGS) ×1 IMPLANT
BRR ADH 4X3 ABS CNTRL BYND (GAUZE/BANDAGES/DRESSINGS) ×1
CANISTER SUCT 1200ML W/VALVE (MISCELLANEOUS) ×2 IMPLANT
CATH KIT ON-Q SILVERSOAK 5 (CATHETERS) IMPLANT
CATH KIT ON-Q SILVERSOAK 5IN (CATHETERS) ×4 IMPLANT
CHLORAPREP W/TINT 26ML (MISCELLANEOUS) ×2 IMPLANT
DRAPE LAPAROTOMY 100X77 ABD (DRAPES) ×2 IMPLANT
DRAPE LAPAROTOMY TRNSV 106X77 (MISCELLANEOUS) ×1 IMPLANT
DRSG TELFA 3X8 NADH (GAUZE/BANDAGES/DRESSINGS) IMPLANT
ELECT BLADE 6 FLAT ULTRCLN (ELECTRODE) ×2 IMPLANT
ELECT REM PT RETURN 9FT ADLT (ELECTROSURGICAL) ×2
ELECTRODE REM PT RTRN 9FT ADLT (ELECTROSURGICAL) ×1 IMPLANT
GAUZE SPONGE 4X4 12PLY STRL (GAUZE/BANDAGES/DRESSINGS) ×1 IMPLANT
GLOVE BIO SURGEON STRL SZ 6.5 (GLOVE) ×5 IMPLANT
GLOVE INDICATOR 7.0 STRL GRN (GLOVE) ×5 IMPLANT
GOWN STRL REUS W/ TWL LRG LVL3 (GOWN DISPOSABLE) ×2 IMPLANT
GOWN STRL REUS W/TWL LRG LVL3 (GOWN DISPOSABLE) ×8
HEMOSTAT ARISTA ABSORB 3G PWDR (MISCELLANEOUS) ×1 IMPLANT
KIT TURNOVER CYSTO (KITS) ×2 IMPLANT
LABEL OR SOLS (LABEL) ×2 IMPLANT
LIGASURE IMPACT 36 18CM CVD LR (INSTRUMENTS) ×1 IMPLANT
NS IRRIG 1000ML POUR BTL (IV SOLUTION) ×2 IMPLANT
PACK BASIN MAJOR ARMC (MISCELLANEOUS) ×2 IMPLANT
PAD DRESSING TELFA 3X8 NADH (GAUZE/BANDAGES/DRESSINGS) ×1 IMPLANT
RETRACTOR WND ALEXIS-O 25 LRG (MISCELLANEOUS) IMPLANT
RETRACTOR WOUND ALXS 18CM MED (MISCELLANEOUS) IMPLANT
RTRCTR WOUND ALEXIS O 18CM MED (MISCELLANEOUS) ×2
RTRCTR WOUND ALEXIS O 25CM LRG (MISCELLANEOUS) ×2
SPONGE LAP 18X18 5 PK (GAUZE/BANDAGES/DRESSINGS) ×1 IMPLANT
STAPLER SKIN PROX 35W (STAPLE) ×1 IMPLANT
SUT MNCRL 4-0 (SUTURE) ×2
SUT MNCRL 4-0 27XMFL (SUTURE) ×1
SUT VIC AB 0 CT1 27 (SUTURE) ×2
SUT VIC AB 0 CT1 27XCR 8 STRN (SUTURE) ×1 IMPLANT
SUT VIC AB 0 CT1 36 (SUTURE) ×4 IMPLANT
SUTURE MNCRL 4-0 27XMF (SUTURE) ×1 IMPLANT
SYR BULB IRRIG 60ML STRL (SYRINGE) ×2 IMPLANT
TRAY FOLEY MTR SLVR 16FR STAT (SET/KITS/TRAYS/PACK) ×2 IMPLANT
TRAY PREP VAG/GEN (MISCELLANEOUS) ×2 IMPLANT

## 2017-08-15 NOTE — Op Note (Signed)
Procedure(s): HYSTERECTOMY ABDOMINAL WITH BILATERAL SALPINGO-OOPHORECTOMY, LYSIS OF ADHESIONS Procedure Note  Candace [name] female 67 y.o. 08/15/2017  Indications: The patient is a 67 y.o. [id number] female with enlarged fibroid uterus, pelvic pressure and pain who desires definitive surgical management.   Pre-operative Diagnosis: enlarged fibroid uterus, pelvic pressure and pain who desires definitive surgical management. Past medical history includes CAD, paroxysmal afib, mild obstructive sleep apnea, DM, obesity, and asthma.     Post-operative Diagnosis: Same with moderate pelvic adhesions  Surgeon: Rubie Maid, MD  Assistants: Malachi Paradise, MD  Anesthesia: General endotracheal anesthesia  Findings: The uterus was enlarged, ~ 14-16 weeks in size.  Fallopian tubes and ovaries appeared normal.  Moderate adhesions of the the lower pelvis.  Procedure Details: The patient was seen in the Holding Room. The risks, benefits, complications, treatment options, and expected outcomes were discussed with the patient.  The patient concurred with the proposed plan, giving informed consent.  The site of surgery properly noted/marked. The patient was taken to the Operating Room, identified as Candace [name] and the procedure verified as Procedure(s) (LRB): HYSTERECTOMY ABDOMINAL WITH BILATERAL SALPINGO-OOPHORECTOMY (Bilateral).   She was then placed under general anesthesia without difficulty. She was placed in the dorsal lithotomy position, and was prepped and draped in a sterile manner. A foley catheter was placed.   A Time Out was held and the above information confirmed.  A midline vertical infraumbilical incision was made slightly lateral to her previous vertical incision and carried through the subcutaneous tissue to the fascia. Fascial incision was made and extended vertically. Old suture material was noted in the fascial region, which was removed.  The rectus muscle was then separated  from the fascia at the midline. The peritoneum was identified and entered. Peritoneal incision was extended longitudinally.  The above findings were noted. An Alexis retractor was placed and bowel was packed away from the surgical site.    The right utero-ovarian ligament and proximal fallopian tube were grasped, coagulated and ligated with the Ligasure device.  The left utero-ovarian ligament and proximal fallopian tube were grasped, coagulated and ligated with the Ligasure device. Lysis of filmy adhesions were performed near both of the adnexa. The round ligaments were identified, coagulated and ligated with the Ligasure device. The anterior peritoneal reflection was incised and the bladder was dissected off the lower uterine segment. Moderated adhesiolysis of the left portion of the uterus to the pelvic sidewall.  Visualization was limited to the size and position of the uterine fibroid, and the decision was made to perform a myomectomy.  The capsule of the fibroid was incised, and the fibroid was dissected out using sharp and blunt dissection.  The uterine vessels were skeletonized, then clamped, coagulated and ligated with the Ligasure device. At this time, the decision was made to amputate the remainder of the uterus for better visualization and manipulation of the cervix in the deep pelvis. Serial pedicles of the cardinal and utero-sacral ligaments were clamped, cut, and suture ligated with 0-Vicryl. Entrance was made into the vagina and the cervix was removed. Vaginal cuff angle sutures were placed incorporating the utero-sacral ligaments for support. The vaginal cuff was then closed with a figure-of-eight stitch of 0-Vicryl. Oozing was noted from the peritoneal edge of the left pelvic sidewall.  The area was reperitonealized with a running suture of 3-0 Vicryl.  Hemostasis was observed.  Arista was placed over the surgical area, and Intercede was placed in the lower pelvis for adhesions prevention.   A  final survey of the pedicles were performed.  The ureters were identified bilaterally with peristalsis.   Retractor and all packing was removed from the abdomen. The fascia was approximated with running sutures of 0-Maxon.  The On-Q pump system was inserted just prior to closure of the fascial layer.  Lavage was carried out. Hemostasis was observed. The subcutaneous fat layer was approximated with 2-0 Vicryl.  The old vertical skin scar was then excised using the bovie. The skin was approximated with staples.  Instrument, sponge, and needle counts were correct prior to abdominal closure and at the conclusion of the case.    Estimated Blood Loss:  350 ml      Drains: foley catheterization with 175 ml of clear urine         Total IV Fluids:  1800 ml  Specimens: Uterus, cervix, large fibroid, and bilateral fallopian tubes and ovaries,          Implants: None         Complications:  None; patient tolerated the procedure well.         Disposition: PACU - hemodynamically stable.         Condition: stable   Rubie Maid, MD [facility]

## 2017-08-15 NOTE — Anesthesia Procedure Notes (Signed)
Procedure Name: Intubation Date/Time: 08/15/2017 3:35 PM Performed by: Dionne Bucy, CRNA Pre-anesthesia Checklist: Patient identified, Patient being monitored, Timeout performed, Emergency Drugs available and Suction available Patient Re-evaluated:Patient Re-evaluated prior to induction Oxygen Delivery Method: Circle system utilized Preoxygenation: Pre-oxygenation with 100% oxygen Induction Type: IV induction Ventilation: Mask ventilation without difficulty Laryngoscope Size: Mac and 4 Grade View: Grade II Tube type: Oral Tube size: 7.0 mm Number of attempts: 1 Airway Equipment and Method: Stylet Placement Confirmation: ETT inserted through vocal cords under direct vision,  positive ETCO2 and breath sounds checked- equal and bilateral Secured at: 22 cm Tube secured with: Tape Dental Injury: Teeth and Oropharynx as per pre-operative assessment

## 2017-08-15 NOTE — Progress Notes (Signed)
Dr. Anselm Jungling (prime Doc on call) notified for blood sugar of 311. One time dose of 5 units novolog insulin ordered to be given. MD also made aware of patients continuous lethargy/ level of confusion. If lethargy and altered mental status persist, patient to be moved to med-surg level of care. Will continue to monitor closely.

## 2017-08-15 NOTE — Transfer of Care (Signed)
Immediate Anesthesia Transfer of Care Note  Patient: Candace [name]  Procedure(s) Performed: HYSTERECTOMY ABDOMINAL WITH BILATERAL SALPINGO-OOPHORECTOMY (Bilateral Abdomen)  Patient Location: PACU  Anesthesia Type:General  Level of Consciousness: sedated  Airway & Oxygen Therapy: Patient Spontanous Breathing and Patient connected to face mask oxygen  Post-op Assessment: Report given to RN and Post -op Vital signs reviewed and stable  Post vital signs: Reviewed and stable  Last Vitals:  Vitals Value Taken Time  BP 173/94 08/15/2017  5:56 PM  Temp    Pulse 72 08/15/2017  5:58 PM  Resp 17 08/15/2017  5:58 PM  SpO2 99 % 08/15/2017  5:58 PM  Vitals shown include unvalidated device data.  Last Pain:  Vitals:   08/15/17 1407  TempSrc: Oral  PainSc: 6       Patients Stated Pain Goal: 3 (12/82/08 1388)  Complications: No apparent anesthesia complications

## 2017-08-15 NOTE — Consult Note (Signed)
[facility] [place] NAME: Candace [name]    MR#:  [id number]  DATE OF BIRTH:  05/04/1950  DATE OF ADMISSION:  08/15/2017  PRIMARY CARE PHYSICIAN: [name]., MD   REQUESTING/REFERRING PHYSICIAN: Marcelline Mates  CHIEF COMPLAINT:  No chief complaint on file.   HISTORY OF PRESENT ILLNESS: Candace [name]  is a 67 y.o. female with a known history of anxiety, coronary artery disease, diabetes, hyperlipidemia, hypertension, myocardial infarction- and stent placement in May 2018, was called in for hysterectomy with bilateral salpingo-oophorectomy. Abdominal hysterectomy surgery is done today. Postsurgically patient stated drowsy and confused, so medical consult was called in for further help. Patient's daughter is in the recovery area with her. Patient had surgery under general anesthesia, uneventful surgery but in the recovery area she had episodes of hypertension. She was complaining of some nausea earlier and received some IV medicines to help with that.  PAST MEDICAL HISTORY:   Past Medical History:  Diagnosis Date  . Allergy   . Anemia   . Anxiety   . Arrhythmia   . Arthritis   . Coronary artery disease   . Depression   . Diabetes mellitus without complication ([place])   . Dyspnea    doe  . Dysrhythmia   . GERD (gastroesophageal reflux disease)   . Headache   . History of hiatal hernia   . Hyperlipidemia   . Hypertension   . Myocardial infarction ([place])    2016, 04/2017  . Myocardial infarction with cardiac rehabilitation [facility])    MI 2016/ CABG 8/17    FINISHED CARDIAC REHAB 3 WEEKS AGO  . Panic attack   . Pneumonia   . Reflux   . Stroke [facility]) 2015   showed up on MRI; no weakness noted  . Voice tremor     PAST SURGICAL HISTORY:  Past Surgical History:  Procedure Laterality Date  . APPENDECTOMY    . ARTERY BIOPSY Right 04/26/2016   Procedure: BIOPSY TEMPORAL ARTERY;  Surgeon: Margaretha Sheffield, MD;  Location: [facility] ORS;  Service:  ENT;  Laterality: Right;  . CARDIAC CATHETERIZATION N/A 11/06/2015   Procedure: Left Heart Cath and Coronary Angiography;  Surgeon: Corey Skains, MD;  Location: [place] CV LAB;  Service: Cardiovascular;  Laterality: N/A;  . CESAREAN SECTION    . CORONARY ANGIOPLASTY  04/2017   [place]  . CORONARY ARTERY BYPASS GRAFT N/A 11/10/2015   Procedure: CORONARY ARTERY BYPASS GRAFTING (CABG), ON PUMP, TIMES FOUR, USING LEFT INTERNAL MAMMARY ARTERY, BILATERAL GREATER SAPHENOUS VEINS HARVESTED ENDOSCOPICALLY;  Surgeon: [name], MD;  Location: [place];  Service: Open Heart Surgery;  Laterality: N/A;  LIMA-LAD; SEQ SVG-OM1-OM2; SVG-PL  . CORONARY STENT INTERVENTION N/A 08/05/2016   Procedure: Coronary Stent Intervention;  Surgeon: Isaias Cowman, MD;  Location: [place] CV LAB;  Service: Cardiovascular;  Laterality: N/A;  . LEFT HEART CATH AND CORONARY ANGIOGRAPHY N/A 08/05/2016   Procedure: Left Heart Cath and Coronary Angiography;  Surgeon: Isaias Cowman, MD;  Location: [place] CV LAB;  Service: Cardiovascular;  Laterality: N/A;  . TEE WITHOUT CARDIOVERSION N/A 11/10/2015   Procedure: TRANSESOPHAGEAL ECHOCARDIOGRAM (TEE);  Surgeon: [name], MD;  Location: [place];  Service: Open Heart Surgery;  Laterality: N/A;  . TUBAL LIGATION      SOCIAL HISTORY:  Social History   Tobacco Use  . Smoking status: Former Smoker    Packs/day: 0.50    Types: Cigarettes    Last  attempt to quit: 10/07/2001    Years since quitting: 15.8  . Smokeless tobacco: Never Used  Substance Use Topics  . Alcohol use: No    Alcohol/week: 0.0 oz    FAMILY HISTORY:  Family History  Problem Relation Age of Onset  . Cancer Father   . Hypertension Father   . Heart disease Father   . Asthma Father   . Cancer Mother   . Hypertension Mother   . Pancreatic cancer Mother   . Cancer Sister   . Breast cancer Sister 5  . Breast cancer Sister 5  . Lung cancer Brother    . Pancreatic cancer Sister   . Cancer Sister     DRUG ALLERGIES:  Allergies  Allergen Reactions  . Lisinopril     cough  . Penicillins Swelling, Rash and Other (See Comments)    Has patient had a PCN reaction causing immediate rash, facial/tongue/throat swelling, SOB or lightheadedness with hypotension: Yes Has patient had a PCN reaction causing severe rash involving mucus membranes or skin necrosis: Yes Has patient had a PCN reaction that required hospitalization No Has patient had a PCN reaction occurring within the last 10 years: Yes If all of the above answers are "NO", then may proceed with Cephalosporin use.     REVIEW OF SYSTEMS:   Patient is drowsy and disoriented, cannot give a review of system.  MEDICATIONS AT HOME:  Prior to Admission medications   Medication Sig Start Date End Date Taking? Authorizing Provider  acetaminophen-codeine (TYLENOL #3) 300-30 MG tablet Take 1-2 tablets by mouth every 6 (six) hours as needed for moderate pain. 08/10/17  Yes Rubie Maid, MD  isosorbide mononitrate (IMDUR) 30 MG 24 hr tablet Take 2 tablets (60 mg total) by mouth daily. Patient taking differently: Take 30 mg by mouth 2 (two) times daily.  11/04/16  Yes Plonk, Gwyndolyn Saxon, MD  metoprolol tartrate (LOPRESSOR) 50 MG tablet TAKE 1 TABLET BY MOUTH TWICE A DAY FOR HIGH BLOOD PRESSURE 02/24/17  Yes [name]., MD  potassium chloride SA (K-DUR,KLOR-CON) 20 MEQ tablet Take 2 tablets (40 mEq total) by mouth daily. 08/12/17  Yes Rubie Maid, MD  rosuvastatin (CRESTOR) 10 MG tablet Take 1 tablet (10 mg total) by mouth daily. 03/24/17  Yes [name]., MD  tiZANidine (ZANAFLEX) 2 MG tablet Take 2 mg by mouth at bedtime.    Yes [provider]  albuterol (PROVENTIL HFA;VENTOLIN HFA) 108 (90 Base) MCG/ACT inhaler Inhale 2 puffs into the lungs every 6 (six) hours as needed for wheezing or shortness of breath. Patient not taking: Reported on 08/15/2017 08/01/17   Rigoberto Noel, MD  aspirin 81 MG chewable tablet Chew 1 tablet (81 mg total) by mouth daily. 10/11/16   [name]., MD  clopidogrel (PLAVIX) 75 MG tablet Take 75 mg by mouth daily.    [provider]  Coenzyme Q10 (CO Q 10) 100 MG CAPS Take 100 mg by mouth daily. 04/25/17   [name]., MD  hydroCHLOROthiazide (MICROZIDE PO) Take 25 mg by mouth daily.    [provider]  hydrochlorothiazide (MICROZIDE) 12.5 MG capsule Take 12.5 mg by mouth daily.    [provider]  loperamide (IMODIUM) 2 MG capsule Take by mouth as needed for diarrhea or loose stools.    [provider]  metFORMIN (GLUCOPHAGE) 1000 MG tablet Take 1 tablet (1,000 mg total) by mouth 2 (two) times daily with a meal. 11/04/16  Plonk, William, MD  nitroGLYCERIN (NITROSTAT) 0.4 MG SL tablet Place 1 tablet (0.4 mg total) under the tongue every 5 (five) minutes as needed for chest pain. 07/27/16   Bettey Costa, MD  ondansetron (ZOFRAN-ODT) 8 MG disintegrating tablet DISSOLVE 1 TABLET ON THE TONGUE EVERY 8 HOURS AS NEEDED FOR NAUSEA OR VOMITING 05/03/17   [name]., MD      PHYSICAL EXAMINATION:   VITAL SIGNS: Blood pressure 140/69, pulse 79, temperature (!) 97.2 F (36.2 C), resp. rate 16, weight 78 kg (172 lb), SpO2 97 %.  GENERAL:  67 y.o.-year-old patient lying in the bed with no acute distress.  EYES: Pupils equal, round, reactive to light and accommodation. No scleral icterus. Extraocular muscles intact.  HEENT: Head atraumatic, normocephalic. Oropharynx and nasopharynx clear.  NECK:  Supple, no jugular venous distention. No thyroid enlargement, no tenderness.  LUNGS: Normal breath sounds bilaterally, no wheezing, rales,rhonchi or crepitation. No use of accessory muscles of respiration.  CARDIOVASCULAR: S1, S2 normal. No murmurs, rubs, or gallops.  ABDOMEN: Soft, nontender, nondistended. Bowel sounds present. No organomegaly or mass. Postsurgical dressing  present. EXTREMITIES: No pedal edema, cyanosis, or clubbing.  NEUROLOGIC: Cranial nerves II through XII are intact. Muscle strength 4/5 in all extremities. Sensation intact. Gait not checked. Patient follows commands. PSYCHIATRIC: The patient is drowsy but easily arousable, not oriented  SKIN: No obvious rash, lesion, or ulcer.   LABORATORY PANEL:   CBC Recent Labs  Lab 08/10/17 1221 08/15/17 1516 08/15/17 1936  WBC 6.2  --  9.9  HGB 13.1 11.9* 12.2  HCT 38.2 35.0* 36.2  PLT 240  --  207  MCV 93.3  --  95.0  MCH 32.0  --  32.1  MCHC 34.3  --  33.8  RDW 13.7  --  13.4   ------------------------------------------------------------------------------------------------------------------  Chemistries  Recent Labs  Lab 08/10/17 1221 08/15/17 1516  NA 140 141  K 3.3* 3.7  CL 101  --   CO2 27  --   GLUCOSE 206* 131*  BUN 19  --   CREATININE 0.79  --   CALCIUM 9.7  --   AST 20  --   ALT 11*  --   ALKPHOS 89  --   BILITOT 0.5  --    ------------------------------------------------------------------------------------------------------------------ estimated creatinine clearance is 68 mL/min (by C-G formula based on SCr of 0.79 mg/dL). ------------------------------------------------------------------------------------------------------------------ No results for input(s): TSH, T4TOTAL, T3FREE, THYROIDAB in the last 72 hours.  Invalid input(s): FREET3   Coagulation profile No results for input(s): INR, PROTIME in the last 168 hours. ------------------------------------------------------------------------------------------------------------------- No results for input(s): DDIMER in the last 72 hours. -------------------------------------------------------------------------------------------------------------------  Cardiac Enzymes No results for input(s): CKMB, TROPONINI, MYOGLOBIN in the last 168 hours.  Invalid input(s):  CK ------------------------------------------------------------------------------------------------------------------ Invalid input(s): POCBNP  ---------------------------------------------------------------------------------------------------------------  Urinalysis    Component Value Date/Time   COLORURINE YELLOW (A) 07/26/2016 1207   APPEARANCEUR HAZY (A) 07/26/2016 1207   APPEARANCEUR Clear 12/24/2014 0905   LABSPEC 1.023 07/26/2016 1207   LABSPEC 1.030 09/14/2013 2123   PHURINE 6.0 07/26/2016 1207   GLUCOSEU NEGATIVE 07/26/2016 1207   GLUCOSEU Negative 09/14/2013 2123   HGBUR NEGATIVE 07/26/2016 1207   BILIRUBINUR neg 03/24/2017 1615   BILIRUBINUR Negative 12/24/2014 0905   BILIRUBINUR Negative 09/14/2013 2123   KETONESUR NEGATIVE 07/26/2016 1207   PROTEINUR 1+ 03/24/2017 1615   PROTEINUR 30 (A) 07/26/2016 1207   UROBILINOGEN negative (A) 03/24/2017 1615   NITRITE neg 03/24/2017 1615   NITRITE NEGATIVE 07/26/2016 1207  LEUKOCYTESUR Negative 03/24/2017 1615   LEUKOCYTESUR Negative 12/24/2014 0905   LEUKOCYTESUR 3+ 09/14/2013 2123     RADIOLOGY: No results found.  EKG: Orders placed or performed during the hospital encounter of 07/26/17  . ED EKG  . ED EKG  . EKG    IMPRESSION AND PLAN:  * altered mental status   Most likely secondary to anesthetic medications use, and delayed clearing   Other possibilities are infection, stroke.    Sending urinalysis, WBCs are stable, check BNP and troponin for now.   If she does not clear up until tomorrow morning we may help to do further workup for possible stroke.   Currently because of surgery, not a candidate for immediate interventions even if we find an acute stroke, so I would hold on to further stroke workup at this time.  * history of coronary artery disease   No complaints of chest pain   Check troponin one time, we may follow further if any doubt.   Advised to continue cardiac medications whenever he can start  taking aspirin and other meds.  * uncontrolled hypertension   I will give IV hydralazine for tonight as she may not be able to take oral.   We may resume oral meds by tomorrow.  * diabetes   Currently keep on glucose checks but no coverage as she may not be able to eat anything tonight.   May resume her home medication and keep on sliding scale coverage from tomorrow if more alert.  All the records are reviewed and case discussed with ED provider. Management plans discussed with the patient, family and they are in agreement.  CODE STATUS: full. Code Status History    Date Active Date Inactive Code Status Order ID Comments User Context   12/23/2016 1722 12/24/2016 1639 Full Code [id number]  Gladstone Lighter, MD Inpatient   08/03/2016 2306 08/06/2016 1247 Full Code [id number]  Lance Coon, MD ED   07/26/2016 1639 07/27/2016 1811 Full Code [id number]  Demetrios Loll, MD Inpatient   11/07/2015 1711 11/10/2015 1552 Full Code [id number]  John Giovanni, PA-C Inpatient   11/05/2015 2155 11/07/2015 1646 Full Code [id number]  Henreitta Leber, MD Inpatient      Her daughter is in the recovery room with patient at the time of my visit. TOTAL TIME TAKING CARE OF THIS PATIENT: 45 minutes.    Vaughan Basta M.D on 08/15/2017   Between 7am to 6pm - Pager - [phone]  After 6pm go to [email or web address] - password [email or web address] [facility]  Office  [phone]  CC: Primary care physician; [name]., MD   Note: This dictation was prepared with Dragon dictation along with smaller phrase technology. Any transcriptional errors that result from this process are unintentional.

## 2017-08-15 NOTE — Interval H&P Note (Signed)
History and Physical Interval Note:  08/15/2017 2:49 PM  Candace [name]  has presented today for surgery, with the diagnosis of UTERINE LEIOMYOMA, PELVIC PAIN, CARDIAC DISEASE  The various methods of treatment have been discussed with the patient and family. After consideration of risks, benefits and other options for treatment, the patient has consented to  Procedure(s): HYSTERECTOMY ABDOMINAL WITH BILATERAL SALPINGO-OOPHORECTOMY (Bilateral) as a surgical intervention .  The patient's history has been reviewed, patient examined, no change in status, stable for surgery.  I have reviewed the patient's chart and labs.  Questions were answered to the patient's satisfaction.     Rubie Maid, MD [facility]

## 2017-08-15 NOTE — Anesthesia Post-op Follow-up Note (Signed)
Anesthesia QCDR form completed.        

## 2017-08-15 NOTE — Anesthesia Preprocedure Evaluation (Signed)
Anesthesia Evaluation  Patient identified by MRN, date of birth, ID band Patient awake    Reviewed: Allergy & Precautions, NPO status , Patient's Chart, lab work & pertinent test results  History of Anesthesia Complications Negative for: history of anesthetic complications  Airway Mallampati: III  TM Distance: >3 FB Neck ROM: Full    Dental  (+) Partial Upper   Pulmonary asthma , sleep apnea , former smoker,    breath sounds clear to auscultation- rhonchi (-) wheezing      Cardiovascular hypertension, + angina + CAD, + Past MI, + Cardiac Stents (2018) and + CABG (2017)  + dysrhythmias (PAF)  Rhythm:Regular Rate:Normal - Systolic murmurs and - Diastolic murmurs NM stress test 11/03/16: no evidence of ischemia  L heart cath 08/05/16: 1. Two-vessel coronary artery disease 2. Patent LIMA to LAD, patent SVG to PDA, patent SVG to OM 2 with high-grade 95% stenosis at the anastomotic site 3. Normal left ventricular function 4. Successful DES with 2.25 x 8 mm Xience Alpine stent at the anastomotic site of SVG    Neuro/Psych  Headaches, PSYCHIATRIC DISORDERS Anxiety Depression CVA    GI/Hepatic hiatal hernia, GERD  ,  Endo/Other  diabetes, Oral Hypoglycemic Agents  Renal/GU      Musculoskeletal  (+) Arthritis ,   Abdominal (+) + obese,   Peds  Hematology  (+) anemia ,   Anesthesia Other Findings Past Medical History: No date: Allergy No date: Anemia No date: Anxiety No date: Arrhythmia No date: Arthritis No date: Coronary artery disease No date: Depression No date: Diabetes mellitus without complication (HCC) No date: Dyspnea     Comment:  doe No date: Dysrhythmia No date: GERD (gastroesophageal reflux disease) No date: Headache No date: History of hiatal hernia No date: Hyperlipidemia No date: Hypertension No date: Myocardial infarction [facility])     Comment:  2016, 04/2017 No date: Myocardial infarction with cardiac  rehabilitation [facility])     Comment:  MI 2016/ CABG 8/17    FINISHED CARDIAC REHAB 3 WEEKS AGO No date: Panic attack No date: Pneumonia No date: Reflux 2015: Stroke ([place])     Comment:  showed up on MRI; no weakness noted No date: Voice tremor   Reproductive/Obstetrics                             Anesthesia Physical Anesthesia Plan  ASA: III  Anesthesia Plan: General   Post-op Pain Management:    Induction: Intravenous  PONV Risk Score and Plan: 2 and Ondansetron and Midazolam  Airway Management Planned: Oral ETT  Additional Equipment:   Intra-op Plan:   Post-operative Plan: Extubation in OR  Informed Consent: I have reviewed the patients History and Physical, chart, labs and discussed the procedure including the risks, benefits and alternatives for the proposed anesthesia with the patient or authorized representative who has indicated his/her understanding and acceptance.   Dental advisory given  Plan Discussed with: CRNA and Anesthesiologist  Anesthesia Plan Comments:         Anesthesia Quick Evaluation

## 2017-08-16 ENCOUNTER — Encounter: Payer: Self-pay | Admitting: Obstetrics and Gynecology

## 2017-08-16 ENCOUNTER — Ambulatory Visit: Payer: Self-pay | Admitting: Family Medicine

## 2017-08-16 DIAGNOSIS — R102 Pelvic and perineal pain: Secondary | ICD-10-CM

## 2017-08-16 DIAGNOSIS — D250 Submucous leiomyoma of uterus: Secondary | ICD-10-CM

## 2017-08-16 DIAGNOSIS — Z9889 Other specified postprocedural states: Secondary | ICD-10-CM

## 2017-08-16 LAB — CBC
HCT: 30.1 % — ABNORMAL LOW (ref 35.0–47.0)
Hemoglobin: 10.2 g/dL — ABNORMAL LOW (ref 12.0–16.0)
MCH: 32 pg (ref 26.0–34.0)
MCHC: 34.1 g/dL (ref 32.0–36.0)
MCV: 93.8 fL (ref 80.0–100.0)
Platelets: 222 10*3/uL (ref 150–440)
RBC: 3.2 MIL/uL — ABNORMAL LOW (ref 3.80–5.20)
RDW: 13.6 % (ref 11.5–14.5)
WBC: 10.7 10*3/uL (ref 3.6–11.0)

## 2017-08-16 LAB — TROPONIN I
Troponin I: 0.07 ng/mL (ref <0.03)
Troponin I: <0.03 ng/mL (ref <0.03)

## 2017-08-16 LAB — GLUCOSE, CAPILLARY
Glucose-Capillary: 222 mg/dL — ABNORMAL HIGH (ref 65–99)
Glucose-Capillary: 242 mg/dL — ABNORMAL HIGH (ref 65–99)
Glucose-Capillary: 156 mg/dL — ABNORMAL HIGH (ref 65–99)
Glucose-Capillary: 182 mg/dL — ABNORMAL HIGH (ref 65–99)

## 2017-08-16 LAB — HEMOGLOBIN A1C
Hgb A1c MFr Bld: 8 % — ABNORMAL HIGH (ref 4.8–5.6)
Mean Plasma Glucose: 182.9 mg/dL

## 2017-08-16 MED ORDER — OXYCODONE HCL 5 MG PO TABS
5.0000 mg | ORAL_TABLET | Freq: Four times a day (QID) | ORAL | Status: DC | PRN
  Administered 2017-08-16 – 2017-08-18 (×7): 5 mg via ORAL
  Filled 2017-08-16 (×8): qty 1

## 2017-08-16 MED ORDER — ASPIRIN 81 MG PO CHEW
81.0000 mg | CHEWABLE_TABLET | Freq: Every day | ORAL | Status: DC
  Filled 2017-08-16 (×3): qty 1

## 2017-08-16 MED ORDER — ACETAMINOPHEN 500 MG PO TABS
1000.0000 mg | ORAL_TABLET | Freq: Four times a day (QID) | ORAL | Status: DC
  Administered 2017-08-17 – 2017-08-18 (×6): 1000 mg via ORAL
  Filled 2017-08-16 (×6): qty 2

## 2017-08-16 NOTE — Progress Notes (Addendum)
Post-Operative Day/ 1, s/p Total Abdominal Hysterectomy with BSO, Lysis of Adhesions for fibroid uterus and pelvic pain.  Patient with PMH of CAD, with recent coronary artery stent placement in 04/2017, paroxysmal afib, mild obstructive sleep apnea, DM, and asthma.      Subjective: no complaints and tolerating PO (did have some mild nausea overnight but none today).  Has not yet ambulated or voided. Foley catheter in place.    Objective: Temp:  [97.2 F (36.2 C)-98.7 F (37.1 C)] 98 F (36.7 C) (05/21 0752) Pulse Rate:  [67-103] 86 (05/21 0752) Resp:  [11-19] 18 (05/21 0752) BP: (117-212)/(60-103) 117/60 (05/21 0752) SpO2:  [95 %-100 %] 98 % (05/21 0752) Weight:  [172 lb (78 kg)] 172 lb (78 kg) (05/21 0348)  Physical Exam:  General: alert and no distress  Lungs: clear to auscultation bilaterally Heart: regular rate and rhythm, S1, S2 normal, no murmur, click, rub or gallop Abdomen: soft, non-tender; bowel sounds normal; no masses,  no organomegaly Pelvis:Bleeding: scant,  Incision: bandage clean/dry/intact.  O-Q pump in place.  Extremities: DVT Evaluation: No evidence of DVT seen on physical exam. Negative Homan's sign. No cords or calf tenderness. No significant calf/ankle edema.  SCDs in place  CBC Latest Ref Rng & Units 08/16/2017 08/16/2017 08/15/2017  WBC 3.6 - 11.0 K/uL 10.7 TEST WILL BE CREDITED 9.9  Hemoglobin 12.0 - 16.0 g/dL 10.2(L) TEST WILL BE CREDITED 12.2  Hematocrit 35.0 - 47.0 % 30.1(L) TEST WILL BE CREDITED 36.2  Platelets 150 - 440 K/uL 222 QUESTIONABLE RESULTS, RECOMMEND RECOLLECT TO VERIFY 207     Lab Results  Component Value Date   CREATININE 0.69 08/15/2017     Results for DARNISHA, VERNET (MRN [id number]) as of 08/16/2017 12:05  Ref. Range 08/16/2017 07:50  Glucose-Capillary Latest Ref Range: 65 - 99 mg/dL 222 (H)    Assessment/Plan:  Continue routine post-operative care Advance diet Encourage ambulation Advance to PO medication Discontinue IV  fluids Remove foley catheter Encourage incentive spiromotery Continue Accuchecks 4 x daily. Will resume Metformin this evening, continue SSI for coverage until then. Diabetic diet ordered.  Advised on limiting salt intake for h/o cardiac disease, patient notes that she usually does not use much salt in her diet.  Will resume Plavis this afternoon once 24 hr post-op.  Continue SCDs when not ambulating.  Pepcid daily for GI prophylaxis.  Will resume BP meds this morning for HTN.     Rubie Maid, MD [facility]

## 2017-08-16 NOTE — Progress Notes (Addendum)
Inpatient Diabetes Program Recommendations  AACE/ADA: New Consensus Statement on Inpatient Glycemic Control (2015)  Target Ranges:  Prepandial:   less than 140 mg/dL      Peak postprandial:   less than 180 mg/dL (1-2 hours)      Critically ill patients:  140 - 180 mg/dL   Lab Results  Component Value Date   GLUCAP 222 (H) 08/16/2017   HGBA1C 7.8 (H) 03/24/2017    Review of Glycemic ControlResults for ANDREANA, KLINGERMAN (MRN [id number]) as of 08/16/2017 09:34  Ref. Range 08/15/2017 14:09 08/15/2017 17:59 08/15/2017 20:19 08/15/2017 21:36 08/16/2017 07:50  Glucose-Capillary Latest Ref Range: 65 - 99 mg/dL 132 (H) 166 (H) 276 (H) 311 (H) 222 (H)    Diabetes history: Type 2 DM  Outpatient Diabetes medications: Metformin 1000 mg bid Current orders for Inpatient glycemic control:  Metformin 1000 mg bid, Novolog moderate tid with meals  Inpatient Diabetes Program Recommendations:    Note hyperglycemia.  Patient did receive Decadron 8 mg on 5/21 during surgery which could affect blood sugars.  Please consider checking A1C to determine pre-hospitalization glycemic control.  Also may consider adding low dose basal insulin while in the hospital.  Consider adding Lantus 15 units daily.   Thanks,  Adah Perl, RN, BC-ADM Inpatient Diabetes Coordinator Pager [phone] (8a-5p)

## 2017-08-16 NOTE — Progress Notes (Signed)
[facility] NAME: Candace [name]    MR#:  [id number]  DATE OF BIRTH:  December 13, 1950  SUBJECTIVE:  Patient seen and evaluated today Is awake and alert and responds to all verbal commands Has some abdominal discomfort into the operative site No constipation Tolerating diet well No chest pain, shortness of breath  REVIEW OF SYSTEMS:    ROS  CONSTITUTIONAL: No documented fever. No fatigue, weakness. No weight gain, no weight loss.  EYES: No blurry or double vision.  ENT: No tinnitus. No postnasal drip. No redness of the oropharynx.  RESPIRATORY: No cough, no wheeze, no hemoptysis. No dyspnea.  CARDIOVASCULAR: No chest pain. No orthopnea. No palpitations. No syncope.  GASTROINTESTINAL: No nausea, no vomiting or diarrhea.  Mild abdominal pain. No melena or hematochezia.  GENITOURINARY: No dysuria or hematuria.  ENDOCRINE: No polyuria or nocturia. No heat or cold intolerance.  HEMATOLOGY: No anemia. No bruising. No bleeding.  INTEGUMENTARY: No rashes. No lesions.  MUSCULOSKELETAL: No arthritis. No swelling. No gout.  NEUROLOGIC: No numbness, tingling, or ataxia. No seizure-type activity.  PSYCHIATRIC: No anxiety. No insomnia. No ADD.   DRUG ALLERGIES:   Allergies  Allergen Reactions  . Lisinopril     cough  . Penicillins Swelling, Rash and Other (See Comments)    Has patient had a PCN reaction causing immediate rash, facial/tongue/throat swelling, SOB or lightheadedness with hypotension: Yes Has patient had a PCN reaction causing severe rash involving mucus membranes or skin necrosis: Yes Has patient had a PCN reaction that required hospitalization No Has patient had a PCN reaction occurring within the last 10 years: Yes If all of the above answers are "NO", then may proceed with Cephalosporin use.     VITALS:  Blood pressure (!) 164/97, pulse 72, temperature 98.2 F (36.8 C), temperature source Oral, resp. rate 18, height 5\' 2"   (1.575 m), weight 78 kg (172 lb), SpO2 98 %.  PHYSICAL EXAMINATION:   Physical Exam  GENERAL:  67 y.o.-year-old patient lying in the bed with no acute distress.  EYES: Pupils equal, round, reactive to light and accommodation. No scleral icterus. Extraocular muscles intact.  HEENT: Head atraumatic, normocephalic. Oropharynx and nasopharynx clear.  NECK:  Supple, no jugular venous distention. No thyroid enlargement, no tenderness.  LUNGS: Normal breath sounds bilaterally, no wheezing, rales, rhonchi. No use of accessory muscles of respiration.  CARDIOVASCULAR: S1, S2 normal. No murmurs, rubs, or gallops.  ABDOMEN: Soft, mild tenderness at operative site, nondistended. Bowel sounds present. No organomegaly or mass.  EXTREMITIES: No cyanosis, clubbing or edema b/l.    NEUROLOGIC: Cranial nerves II through XII are intact. No focal Motor or sensory deficits b/l.   PSYCHIATRIC: The patient is alert and oriented x 3.  SKIN: No obvious rash, lesion, or ulcer.   LABORATORY PANEL:   CBC Recent Labs  Lab 08/16/17 0841  WBC 10.7  HGB 10.2*  HCT 30.1*  PLT 222   ------------------------------------------------------------------------------------------------------------------ Chemistries  Recent Labs  Lab 08/10/17 1221  08/15/17 1936  NA 140   < > 138  K 3.3*   < > 3.7  CL 101  --  105  CO2 27  --  22  GLUCOSE 206*   < > 256*  BUN 19  --  14  CREATININE 0.79  --  0.69  CALCIUM 9.7  --  8.5*  AST 20  --   --   ALT 11*  --   --   ALKPHOS  89  --   --   BILITOT 0.5  --   --    < > = values in this interval not displayed.   ------------------------------------------------------------------------------------------------------------------  Cardiac Enzymes Recent Labs  Lab 08/16/17 0737  TROPONINI <0.03   ------------------------------------------------------------------------------------------------------------------  RADIOLOGY:  No results found.   ASSESSMENT AND PLAN:   67 year old female patient with history of anxiety disorder, coronary artery disease, diabetes mellitus type 2, hyperlipidemia, hypertension, myocardial infarction and stent placement had abdominal hysterectomy with bilateral salpingo-oophorectomy.  -Altered mental status completely resolved This was secondary to anesthetic medications and delayed clearing of the medication Urine analysis did not reveal any infection Troponins were negative  -Status post abdominal hysterectomy Further care as per GYN Dr.  Marland KitchenCoronary artery disease Resume cardiac medications Plavix resumed  -Diabetes mellitus Diabetic diet Oral metformin started And sliding scale coverage for control blood sugars  -Hypertension Resume blood pressure medications HCTZ on boar with prn IV hydralazine  -We will follow-up as needed thank you for the consultation  All the records are reviewed and case discussed with Care Management/Social Worker. Management plans discussed with the patient, family and they are in agreement.  CODE STATUS: Full code  DVT Prophylaxis: SCDs  TOTAL TIME TAKING CARE OF THIS PATIENT: 35 minutes.   Saundra Shelling M.D on 08/16/2017 at 1:51 PM  Between 7am to 6pm - Pager - [phone]  After 6pm go to [email or web address] - password [email or web address] [facility]  Office  [phone]  CC: Primary care physician; [name]., MD  Note: This dictation was prepared with Dragon dictation along with smaller phrase technology. Any transcriptional errors that result from this process are unintentional.

## 2017-08-17 LAB — TROPONIN I
Troponin I: <0.03 ng/mL (ref <0.03)
Troponin I: <0.03 ng/mL (ref <0.03)

## 2017-08-17 LAB — GLUCOSE, CAPILLARY
Glucose-Capillary: 156 mg/dL — ABNORMAL HIGH (ref 65–99)
Glucose-Capillary: 144 mg/dL — ABNORMAL HIGH (ref 65–99)
Glucose-Capillary: 210 mg/dL — ABNORMAL HIGH (ref 65–99)

## 2017-08-17 MED ORDER — ONDANSETRON 4 MG PO TBDP
8.0000 mg | ORAL_TABLET | Freq: Four times a day (QID) | ORAL | Status: DC | PRN
  Administered 2017-08-17 – 2017-08-18 (×2): 8 mg via ORAL
  Filled 2017-08-17: qty 2

## 2017-08-17 MED ORDER — FERROUS SULFATE 325 (65 FE) MG PO TABS
325.0000 mg | ORAL_TABLET | Freq: Every day | ORAL | Status: DC
Start: 2017-08-17 — End: 2017-08-18
  Administered 2017-08-17 – 2017-08-18 (×2): 325 mg via ORAL
  Filled 2017-08-17 (×2): qty 1

## 2017-08-17 MED ORDER — ASPIRIN EC 81 MG PO TBEC
81.0000 mg | DELAYED_RELEASE_TABLET | Freq: Every day | ORAL | Status: DC
Start: 2017-08-17 — End: 2017-08-18
  Administered 2017-08-17 – 2017-08-18 (×2): 81 mg via ORAL
  Filled 2017-08-17 (×2): qty 1

## 2017-08-17 NOTE — Progress Notes (Signed)
Post-Operative Day/ 2, s/p Total Abdominal Hysterectomy with BSO, Lysis of Adhesions for fibroid uterus and pelvic pain.  Patient with PMH of CAD, with recent coronary artery stent placement in 04/2017, paroxysmal afib, mild obstructive sleep apnea, DM, and asthma.      Subjective: up ad lib, voiding, tolerating PO and + flatus.  Patient does complain of some mild chest pain on left this morning. Feels like her typical angina. Notes that she has been ambulating in the room several times.    Objective: Vitals:   08/16/17 2206 08/17/17 0219 08/17/17 0803 08/17/17 0832  BP: (!) 108/52 122/66 102/60 121/60  Pulse: 77 76 68 75  Resp: 18 18  18   Temp: 98.2 F (36.8 C) 97.9 F (36.6 C)  98 F (36.7 C)  TempSrc: Oral Oral Oral Oral  SpO2: 100% 99% 99% 97%  Weight:      Height:         Physical Exam:  General: alert and no distress  Lungs: clear to auscultation bilaterally Heart: regular rate and rhythm, S1, S2 normal, no murmur, click, rub or gallop Abdomen: normal findings: bowel sounds normal, no masses palpable and soft.  Mildly tender near incision site (appropriate) Pelvis:Bleeding: scant,  Incision: healing well, staples in place.  Old dry blood, no active bleeding.  O-Q pump in place.  Extremities: DVT Evaluation: No evidence of DVT seen on physical exam. Negative Homan's sign. No cords or calf tenderness. No significant calf/ankle edema.  SCDs in place  CBC Latest Ref Rng & Units 08/16/2017 08/16/2017 08/15/2017  WBC 3.6 - 11.0 K/uL 10.7 TEST WILL BE CREDITED 9.9  Hemoglobin 12.0 - 16.0 g/dL 10.2(L) TEST WILL BE CREDITED 12.2  Hematocrit 35.0 - 47.0 % 30.1(L) TEST WILL BE CREDITED 36.2  Platelets 150 - 440 K/uL 222 QUESTIONABLE RESULTS, RECOMMEND RECOLLECT TO VERIFY 207     Lab Results  Component Value Date   CREATININE 0.69 08/15/2017     Lab Results  Component Value Date   HGBA1C 8.0 (H) 08/16/2017    Results for GETSEMANI, LINDON (MRN [id number]) as of 08/17/2017  09:25  Ref. Range 08/16/2017 07:50 08/16/2017 08:41 08/16/2017 12:02 08/16/2017 17:28 08/16/2017 21:53 08/17/2017 08:05  Glucose-Capillary Latest Ref Range: 65 - 99 mg/dL 222 (H)  242 (H) 156 (H) 182 (H) 156 (H)    Assessment/Plan:  Continue routine post-operative care Carb-modified diet, low salt diet Encourage ambulation and use of incentive spirometer Continue PO medication as needed Continue Accuchecks 4 x daily. Has resumed Metformin, blood sugars have improved since yesterday afternoon. Continue SSI for coverage. Patient with A1C of 8.0.  Will need to f/u with PCP as outpatient for optimization of diabetes management.  Has resumed Plavix and aspirin for h/o CAD. Continue SCDs when not ambulating.  Patient with h/o unstable angina, having symptoms this morning. Will administer dose of nitroglycerin.  Pepcid daily for GI prophylaxis.  BP was low-normal this morning.  Coupled with mild post-operative anemia may be triggers for angina episode.  If BPs continue to be low, will hold BP meds this morning for HTN.  Will treat mild post-operative anemia with ferrous sulfate daily.  Continue co-management of medical comorbidities with Hospitalist as indicated.  Dispo: possible d/c home in 1-2 days.     Rubie Maid, MD [facility]

## 2017-08-17 NOTE — Progress Notes (Signed)
This morning, pt complained of chest pain (mid chest, sharp pain) and requested nitroglycerin as ordered PRN. Pt did report relief of the pain after the nitroglycerin. VS stable.   Now the patient is complaining of chest pain again, but reports nagging, esophogeal pain rated 7/10. Also requested zofran as ordered PRN. The patient is currently sitting up in her chair, bathing with assistance from the NT.   Informed Dr. Suzzette Righter. New order for troponin every 6 hours x 3.

## 2017-08-17 NOTE — Anesthesia Postprocedure Evaluation (Signed)
Anesthesia Post Note  Patient: Candace [name]  Procedure(s) Performed: HYSTERECTOMY ABDOMINAL WITH BILATERAL SALPINGO-OOPHORECTOMY (Bilateral Abdomen)  Patient location during evaluation: PACU Anesthesia Type: General Level of consciousness: sedated Pain management: pain level controlled Vital Signs Assessment: post-procedure vital signs reviewed and stable Respiratory status: spontaneous breathing Cardiovascular status: blood pressure returned to baseline Anesthetic complications: no Comments: Patient confused in the immediate postop period.  Sensorium cleared to normal overnight with clearance of meds      Last Vitals:  Vitals:   08/17/17 1222 08/17/17 1538  BP: 112/60 101/70  Pulse: 76 80  Resp: 18 20  Temp: 36.6 C 36.7 C  SpO2: 100% 99%    Last Pain:  Vitals:   08/17/17 1543  TempSrc:   PainSc: 5                  [name]

## 2017-08-18 LAB — GLUCOSE, CAPILLARY
Glucose-Capillary: 155 mg/dL — ABNORMAL HIGH (ref 65–99)
Glucose-Capillary: 140 mg/dL — ABNORMAL HIGH (ref 65–99)
Glucose-Capillary: 154 mg/dL — ABNORMAL HIGH (ref 65–99)

## 2017-08-18 LAB — TROPONIN I: Troponin I: <0.03 ng/mL (ref <0.03)

## 2017-08-18 LAB — SURGICAL PATHOLOGY

## 2017-08-18 MED ORDER — FERROUS SULFATE 325 (65 FE) MG PO TABS: 325.0000 mg | ORAL_TABLET | Freq: Every day | ORAL | 1 refills | Status: DC

## 2017-08-18 MED ORDER — OXYCODONE HCL 5 MG PO TABS: 5.0000 mg | ORAL_TABLET | Freq: Four times a day (QID) | ORAL | 0 refills | Status: DC | PRN

## 2017-08-18 MED ORDER — DOCUSATE SODIUM 100 MG PO CAPS: 100.0000 mg | ORAL_CAPSULE | Freq: Two times a day (BID) | ORAL | 2 refills | Status: DC | PRN

## 2017-08-18 MED ORDER — ACETAMINOPHEN 500 MG PO TABS: 1000.0000 mg | ORAL_TABLET | Freq: Three times a day (TID) | ORAL | 1 refills | Status: DC | PRN

## 2017-08-18 MED ORDER — BISACODYL 10 MG RE SUPP
10.0000 mg | Freq: Once | RECTAL | Status: AC
  Administered 2017-08-18: 10 mg via RECTAL
  Filled 2017-08-18: qty 1

## 2017-08-18 NOTE — Care Management Important Message (Signed)
Important Message  Patient Details  Name: Candace [name] MRN: [id number] Date of Birth: 28-Sep-1950   Medicare Important Message Given:  Yes    Juliann Pulse A Quantia Grullon 08/18/2017, 9:29 AM

## 2017-08-18 NOTE — Progress Notes (Addendum)
Provided and reviewed discharge paperwork and prescriptions. Verified understanding by use of teach back method, pt verbalized understanding. Follow up appointment provided. OnQ pump removal instructions given, pt discharged with OnQ pump intact. Waiting on pt's daughter to pick her up for transport. Will discharge via wheel chair by hospital volunteer.

## 2017-08-18 NOTE — Discharge Instructions (Signed)
General Gynecological Post-Operative Instructions °You may expect to feel dizzy, weak, and drowsy for as long as 24 hours after receiving the medicine that made you sleep (anesthetic).  °Do not drive a car, ride a bicycle, participate in physical activities, or take public transportation until you are done taking narcotic pain medicines or as directed by your doctor.  °Do not drink alcohol or take tranquilizers.  °Do not take medicine that has not been prescribed by your doctor.  °Do not sign important papers or make important decisions while on narcotic pain medicines.  °Have a responsible person with you.  °CARE OF INCISION  °Keep incision clean and dry. °Take showers instead of baths until your doctor gives you permission to take baths.  °Avoid heavy lifting (more than 10 pounds/4.5 kilograms), pushing, or pulling.  °Avoid activities that may risk injury to your surgical site.  °No sexual intercourse or placement of anything in the vagina for 6 weeks or as instructed by your doctor. °If you have tubes coming from the wound site, check with your doctor regarding appropriate care of the tubes. °Only take prescription or over-the-counter medicines  for pain, discomfort, or fever as directed by your doctor. Do not take aspirin. It can make you bleed. Take medicines (antibiotics) that kill germs if they are prescribed for you.  °Call the office or go to the Emergency Room if:  °You feel sick to your stomach (nauseous).  °You start to throw up (vomit).  °You have trouble eating or drinking.  °You have an oral temperature above 101.  °You have constipation that is not helped by adjusting diet or increasing fluid intake. Pain medicines are a common cause of constipation.  °You have any other concerns. °SEEK IMMEDIATE MEDICAL CARE IF:  °You have persistent dizziness.  °You have difficulty breathing or a congested sounding (croupy) cough.  °You have an oral temperature above 102.5, not controlled by medicine.  °There is  increasing pain or tenderness near or in the surgical site.  ° ° °

## 2017-08-18 NOTE — Discharge Summary (Signed)
? Gynecology Physician Postoperative Discharge Summary  Patient ID: Candace [name] MRN: [id number] DOB/AGE: May 25, 1950 67 y.o.  Admit Date: 08/15/2017 Discharge Date: 08/18/2017  Preoperative Diagnoses:  Fibroid uterus Pelvic pain Coronary artery disease of bypass graft of native heart with stable angina pectoris (HCC)  Mild intermittent asthma without complication Mild obstructive sleep apnea  Unstable angina (HCC)  Type 2 diabetes mellitus with other circulatory complication, without long-term current use of insulin (HCC)     Procedures: Procedure(s) (LRB): HYSTERECTOMY ABDOMINAL WITH BILATERAL SALPINGO-OOPHORECTOMY (Bilateral)  Hospital Course:  Candace [name] is a 67 y.o. [id number]  admitted for scheduled surgery.  She underwent the procedures as mentioned above, her operation was uncomplicated. For further details about surgery, please refer to the operative report. Patient had an uncomplicated postoperative course. By time of discharge on POD#3, her pain was controlled on oral pain medications; she was ambulating, voiding without difficulty, tolerating regular diet and passing flatus. She was deemed stable for discharge to home.   Significant Labs: CBC Latest Ref Rng & Units 08/16/2017 08/16/2017 08/15/2017  WBC 3.6 - 11.0 K/uL 10.7 TEST WILL BE CREDITED 9.9  Hemoglobin 12.0 - 16.0 g/dL 10.2(L) TEST WILL BE CREDITED 12.2  Hematocrit 35.0 - 47.0 % 30.1(L) TEST WILL BE CREDITED 36.2  Platelets 150 - 440 K/uL 222 QUESTIONABLE RESULTS, RECOMMEND RECOLLECT TO VERIFY 207    Discharge Exam: Blood pressure 126/72, pulse 89, temperature 97.8 F (36.6 C), temperature source Oral, resp. rate 16, height 5\' 2"  (1.575 m), weight 172 lb (78 kg), SpO2 98 %. General appearance: alert and no distress  Resp: clear to auscultation bilaterally  Cardio: regular rate and rhythm  GI: soft, non-tender; bowel sounds normal; no masses, no organomegaly.  Incision: C/D/I, no erythema, no drainage  noted Pelvic: no blood on pad.  Extremities: extremities normal, atraumatic, no cyanosis or edema and Homans sign is negative, no sign of DVT  Discharged Condition: Stable  Disposition: Discharge disposition: 01-Home or Self Care       Discharge Instructions    Discharge patient   Complete by:  As directed    Discharge disposition:  01-Home or Self Care   Discharge patient date:  08/18/2017     Allergies as of 08/18/2017      Reactions   Lisinopril    cough   Penicillins Swelling, Rash, Other (See Comments)   Has patient had a PCN reaction causing immediate rash, facial/tongue/throat swelling, SOB or lightheadedness with hypotension: Yes Has patient had a PCN reaction causing severe rash involving mucus membranes or skin necrosis: Yes Has patient had a PCN reaction that required hospitalization No Has patient had a PCN reaction occurring within the last 10 years: Yes If all of the above answers are "NO", then may proceed with Cephalosporin use.      Medication List    STOP taking these medications   acetaminophen-codeine 300-30 MG tablet Commonly known as:  TYLENOL #3   potassium chloride SA 20 MEQ tablet Commonly known as:  K-DUR,KLOR-CON     TAKE these medications   acetaminophen 500 MG tablet Commonly known as:  TYLENOL Take 2 tablets (1,000 mg total) by mouth every 8 (eight) hours as needed.   albuterol 108 (90 Base) MCG/ACT inhaler Commonly known as:  PROVENTIL HFA;VENTOLIN HFA Inhale 2 puffs into the lungs every 6 (six) hours as needed for wheezing or shortness of breath.   aspirin 81 MG chewable tablet Chew 1 tablet (81 mg total) by mouth daily.  clopidogrel 75 MG tablet Commonly known as:  PLAVIX Take 75 mg by mouth daily.   Co Q 10 100 MG Caps Take 100 mg by mouth daily.   docusate sodium 100 MG capsule Commonly known as:  COLACE Take 1 capsule (100 mg total) by mouth 2 (two) times daily as needed.   ferrous sulfate 325 (65 FE) MG tablet Take  1 tablet (325 mg total) by mouth daily with breakfast. Start taking on:  08/19/2017   hydrochlorothiazide 12.5 MG capsule Commonly known as:  MICROZIDE Take 12.5 mg by mouth daily. What changed:  Another medication with the same name was removed. Continue taking this medication, and follow the directions you see here.   isosorbide mononitrate 30 MG 24 hr tablet Commonly known as:  IMDUR Take 2 tablets (60 mg total) by mouth daily. What changed:    how much to take  when to take this   loperamide 2 MG capsule Commonly known as:  IMODIUM Take by mouth as needed for diarrhea or loose stools.   metFORMIN 1000 MG tablet Commonly known as:  GLUCOPHAGE Take 1 tablet (1,000 mg total) by mouth 2 (two) times daily with a meal.   metoprolol tartrate 50 MG tablet Commonly known as:  LOPRESSOR TAKE 1 TABLET BY MOUTH TWICE A DAY FOR HIGH BLOOD PRESSURE   nitroGLYCERIN 0.4 MG SL tablet Commonly known as:  NITROSTAT Place 1 tablet (0.4 mg total) under the tongue every 5 (five) minutes as needed for chest pain.   ondansetron 8 MG disintegrating tablet Commonly known as:  ZOFRAN-ODT DISSOLVE 1 TABLET ON THE TONGUE EVERY 8 HOURS AS NEEDED FOR NAUSEA OR VOMITING   oxyCODONE 5 MG immediate release tablet Commonly known as:  Oxy IR/ROXICODONE Take 1 tablet (5 mg total) by mouth every 6 (six) hours as needed for moderate pain.   rosuvastatin 10 MG tablet Commonly known as:  CRESTOR Take 1 tablet (10 mg total) by mouth daily.   tiZANidine 2 MG tablet Commonly known as:  ZANAFLEX Take 2 mg by mouth at bedtime.      Future Appointments  Date Time Provider [place]  08/23/2017  1:30 PM Rubie Maid, MD [facility] None  11/02/2017  9:00 AM [facility]  11/16/2017 11:20 AM [name]., MD [facility] None   Follow-up Information    Rubie Maid, MD Follow up.   Specialties:  Obstetrics and Gynecology, Radiology Why:  Has appointment in 1 week for post-op  check Contact information: [place] [place] [address] [phone]           Signed:  Rubie Maid, MD [facility]

## 2017-08-23 ENCOUNTER — Ambulatory Visit (INDEPENDENT_AMBULATORY_CARE_PROVIDER_SITE_OTHER): Payer: Medicare Other | Admitting: Obstetrics and Gynecology

## 2017-08-23 ENCOUNTER — Encounter: Payer: Self-pay | Admitting: Obstetrics and Gynecology

## 2017-08-23 VITALS — BP 136/77 | HR 103 | Wt 172.5 lb

## 2017-08-23 DIAGNOSIS — I480 Paroxysmal atrial fibrillation: Secondary | ICD-10-CM

## 2017-08-23 DIAGNOSIS — Z90722 Acquired absence of ovaries, bilateral: Secondary | ICD-10-CM

## 2017-08-23 DIAGNOSIS — M316 Other giant cell arteritis: Secondary | ICD-10-CM

## 2017-08-23 DIAGNOSIS — J449 Chronic obstructive pulmonary disease, unspecified: Secondary | ICD-10-CM

## 2017-08-23 DIAGNOSIS — Z9889 Other specified postprocedural states: Secondary | ICD-10-CM

## 2017-08-23 DIAGNOSIS — I952 Hypotension due to drugs: Secondary | ICD-10-CM

## 2017-08-23 DIAGNOSIS — Z9079 Acquired absence of other genital organ(s): Secondary | ICD-10-CM

## 2017-08-23 DIAGNOSIS — I10 Essential (primary) hypertension: Secondary | ICD-10-CM

## 2017-08-23 DIAGNOSIS — Z90710 Acquired absence of both cervix and uterus: Secondary | ICD-10-CM

## 2017-08-23 DIAGNOSIS — Z131 Encounter for screening for diabetes mellitus: Secondary | ICD-10-CM

## 2017-08-23 DIAGNOSIS — J9621 Acute and chronic respiratory failure with hypoxia: Secondary | ICD-10-CM | POA: Insufficient documentation

## 2017-08-23 NOTE — Progress Notes (Signed)
    OBSTETRICS/GYNECOLOGY POST-OPERATIVE CLINIC VISIT  Subjective:     Candace [name] is a 67 y.o. female who presents to the clinic 1 weeks status post total abdominal hysterectomy and bilateral salpingo-oophorectomy for fibroids and pelvic pain. Eating a regular diet without difficulty. Bowel movements are normal. Pain is controlled with current analgesics. Medications being used: acetaminophen and narcotic analgesics including oxycodone (Oxycontin, Oxyir).  Was able to remove On-Q pain pump without difficulty.    The following portions of the patient's history were reviewed and updated as appropriate: allergies, current medications, past family history, past medical history, past social history, past surgical history and problem list.  Review of Systems A comprehensive review of systems was negative except for: Skin: patient notes where bandages were for pain pump she noticed several small blisters/vesicles , and Cardiovascular: patient's daughter notes that patient has been having episodes of hypotension at night for the past 1-2 nights, sometimes with mild confusion episodes lasting 1-2 minutes and heart palpitations.  Notes BPs drop to 90s/60s.    Objective:    BP 136/77   Pulse (!) 103   Wt 172 lb 8 oz (78.2 kg)   BMI 31.55 kg/m  General:  alert and no distress  Abdomen: soft, bowel sounds active, non-tender  Incision:   healing well, no drainage, no erythema, no hernia, no seroma, no swelling, no dehiscence, incision well approximated.  Staples in place.  Area of desquamation near On-Q pain pump site.     Pathology:  A. UTERUS WITH CERVIX, BILATERAL FALLOPIAN TUBES AND OVARIES;  HYSTERECTOMY WITH BILATERAL SALPINGO-OOPHORECTOMY:  - ACUTE AND CHRONIC CERVICITIS WITH SQUAMOUS METAPLASIA.  - SEROSAL ADHESIONS.  - WEAKLY PROLIFERATIVE/SURFACE TYPE ENDOMETRIUM.  - LEIOMYOMA WITH HYDROPIC DEGENERATION 7.5 CM.  - BILATERAL FALLOPIAN TUBES WITHOUT PATHOLOGIC CHANGE.  - OVARIES WITH  MILD STROMAL HYPERPLASIA.  - NEGATIVE FOR ATYPIA AND MALIGNANCY.   Assessment:    Post-operative state (s/p TAH/BSO)  Hypotension (mainly in evenings) in setting of h/o hypertension DM (uncontrolled)   Plan:   1. Continue any current medications. Advised on continuing to wean from strong pain medications.  To hold on evening dose of Metoprolol for now, and to continue holding HCTZ. If hypotension continues, she should contact her PCP. Also needs to further discuss management of DM as Metformin does not appear to be adequate.   2. Wound care discussed.  Staples removed today, incision covered with Steri-strips. Advised on band-aid and Neosporin to blistering region.  3. Operative findings again reviewed. Pathology report discussed. 4. Activity restrictions: no bending, stooping, or squatting, no lifting more than 10-15 pounds and pelvic rest 5. Anticipated return to work: not applicable. 6. Follow up: 5 weeks for final post-op check.     Rubie Maid, MD [facility]

## 2017-08-23 NOTE — Progress Notes (Signed)
Pt is present doing well. Pt stated that her incision is doing well.

## 2017-08-23 NOTE — Patient Instructions (Signed)
Hold Hydrochlorothiazide (Maxide) for now.  Hold evening dose of Lopressor. Only take Lopressor 50 mg in a.m.

## 2017-08-30 ENCOUNTER — Telehealth: Payer: Self-pay | Admitting: Obstetrics and Gynecology

## 2017-08-30 NOTE — Telephone Encounter (Signed)
The patient just called stating she has had black stools since Sunday.  She took some Immodium.  She needs to know if she needs to be seen by Dr. Marcelline Mates or does she need to go somewhere else.  She is also having abdominal pain.  Her call back number is [phone].  Please advise, thank you.

## 2017-08-31 NOTE — Telephone Encounter (Signed)
Pt was called to see was she still taking iron pills. Pt stated that she was and pt was informed that was why her stool was black. Pt stated that it hurts when she urinate and it feel like pressure. Pt stated that she was taking imoduim for her constipation. Pt was aware that imoduim was used for diarrhea not for constipation. Pt is making an appointment to be seen by the doctor for her pain and pressure when urinating and abd pain.  Candace [name]

## 2017-09-07 ENCOUNTER — Telehealth: Payer: Self-pay

## 2017-09-07 NOTE — Telephone Encounter (Addendum)
Patient called saying that she has had chest pain off and on for the last 2-3 days. She also mentions that her BP has been elevated for the last 2 weeks. She reports that when she checked her BP this morning it was 172/98. She describes the pain as a shooting, stabbing pain lasting a few mins. She reports that it is located in her mid chest and radiates to her back.   Patient denies nausea, numbness or tingling in her extremities, shortness of breath, or headaches. She denies any injuries or heavy lifting. She does mention that she was advised by GYN to hold her evening dose of Metoprolol and hold HCTZ due to HYPOtension. However, patient reports that since her BP has been running high for the last week, she has gone back to her daily regime. Appt was scheduled for tomorrow for evaluation. Advised patient that if symptoms worsen or return that she should go to the ER. Patient verbalized understanding.

## 2017-09-08 ENCOUNTER — Encounter: Payer: Self-pay | Admitting: Obstetrics and Gynecology

## 2017-09-08 ENCOUNTER — Encounter: Payer: Self-pay | Admitting: Family Medicine

## 2017-09-08 ENCOUNTER — Ambulatory Visit (INDEPENDENT_AMBULATORY_CARE_PROVIDER_SITE_OTHER): Payer: Medicare Other | Admitting: Family Medicine

## 2017-09-08 ENCOUNTER — Ambulatory Visit (INDEPENDENT_AMBULATORY_CARE_PROVIDER_SITE_OTHER): Payer: Medicare Other | Admitting: Obstetrics and Gynecology

## 2017-09-08 VITALS — BP 94/62 | HR 78 | Temp 98.7°F | Resp 16 | Wt 164.0 lb

## 2017-09-08 VITALS — BP 124/73 | HR 88 | Ht 62.0 in | Wt 168.5 lb

## 2017-09-08 DIAGNOSIS — R300 Dysuria: Secondary | ICD-10-CM | POA: Diagnosis not present

## 2017-09-08 DIAGNOSIS — R5382 Chronic fatigue, unspecified: Secondary | ICD-10-CM | POA: Diagnosis not present

## 2017-09-08 DIAGNOSIS — I200 Unstable angina: Secondary | ICD-10-CM

## 2017-09-08 DIAGNOSIS — R42 Dizziness and giddiness: Secondary | ICD-10-CM

## 2017-09-08 DIAGNOSIS — R1013 Epigastric pain: Secondary | ICD-10-CM

## 2017-09-08 DIAGNOSIS — E1159 Type 2 diabetes mellitus with other circulatory complications: Secondary | ICD-10-CM | POA: Diagnosis not present

## 2017-09-08 DIAGNOSIS — R079 Chest pain, unspecified: Secondary | ICD-10-CM | POA: Diagnosis not present

## 2017-09-08 DIAGNOSIS — F418 Other specified anxiety disorders: Secondary | ICD-10-CM

## 2017-09-08 DIAGNOSIS — I25708 Atherosclerosis of coronary artery bypass graft(s), unspecified, with other forms of angina pectoris: Secondary | ICD-10-CM

## 2017-09-08 DIAGNOSIS — D519 Vitamin B12 deficiency anemia, unspecified: Secondary | ICD-10-CM | POA: Diagnosis not present

## 2017-09-08 DIAGNOSIS — M940 Chondrocostal junction syndrome [Tietze]: Secondary | ICD-10-CM

## 2017-09-08 DIAGNOSIS — N99820 Postprocedural hemorrhage and hematoma of a genitourinary system organ or structure following a genitourinary system procedure: Secondary | ICD-10-CM

## 2017-09-08 DIAGNOSIS — K5903 Drug induced constipation: Secondary | ICD-10-CM

## 2017-09-08 DIAGNOSIS — Z9889 Other specified postprocedural states: Secondary | ICD-10-CM

## 2017-09-08 LAB — POCT URINALYSIS DIPSTICK
Bilirubin, UA: NEGATIVE
Glucose, UA: NEGATIVE
Ketones, UA: NEGATIVE
Nitrite, UA: NEGATIVE
Protein, UA: POSITIVE — AB
Spec Grav, UA: 1.025 (ref 1.010–1.025)
Urobilinogen, UA: 0.2 E.U./dL
pH, UA: 6 (ref 5.0–8.0)

## 2017-09-08 NOTE — Patient Instructions (Signed)
Hiatal Hernia  A hiatal hernia occurs when part of the stomach slides above the muscle that separates the abdomen from the chest (diaphragm). A person can be born with a hiatal hernia (congenital), or it may develop over time. In almost all cases of hiatal hernia, only the top part of the stomach pushes through the diaphragm.  Many people have a hiatal hernia with no symptoms. The larger the hernia, the more likely it is that you will have symptoms. In some cases, a hiatal hernia allows stomach acid to flow back into the tube that carries food from your mouth to your stomach (esophagus). This may cause heartburn symptoms. Severe heartburn symptoms may mean that you have developed a condition called gastroesophageal reflux disease (GERD).  What are the causes?  This condition is caused by a weakness in the opening (hiatus) where the esophagus passes through the diaphragm to attach to the upper part of the stomach. A person may be born with a weakness in the hiatus, or a weakness can develop over time.  What increases the risk?  This condition is more likely to develop in:  · Older people. Age is a major risk factor for a hiatal hernia, especially if you are over the age of 50.  · Pregnant women.  · People who are overweight.  · People who have frequent constipation.    What are the signs or symptoms?  Symptoms of this condition usually develop in the form of GERD symptoms. Symptoms include:  · Heartburn.  · Belching.  · Indigestion.  · Trouble swallowing.  · Coughing or wheezing.  · Sore throat.  · Hoarseness.  · Chest pain.  · Nausea and vomiting.    How is this diagnosed?  This condition may be diagnosed during testing for GERD. Tests that may be done include:  · X-rays of your stomach or chest.  · An upper gastrointestinal (GI) series. This is an X-ray exam of your GI tract that is taken after you swallow a chalky liquid that shows up clearly on the X-ray.  · Endoscopy. This is a procedure to look into your  stomach using a thin, flexible tube that has a tiny camera and light on the end of it.    How is this treated?  This condition may be treated by:  · Dietary and lifestyle changes to help reduce GERD symptoms.  · Medicines. These may include:  ? Over-the-counter antacids.  ? Medicines that make your stomach empty more quickly.  ? Medicines that block the production of stomach acid (H2 blockers).  ? Stronger medicines to reduce stomach acid (proton pump inhibitors).  · Surgery to repair the hernia, if other treatments are not helping.    If you have no symptoms, you may not need treatment.  Follow these instructions at home:  Lifestyle and activity  · Do not use any products that contain nicotine or tobacco, such as cigarettes and e-cigarettes. If you need help quitting, ask your health care provider.  · Try to achieve and maintain a healthy body weight.  · Avoid putting pressure on your abdomen. Anything that puts pressure on your abdomen increases the amount of acid that may be pushed up into your esophagus.  ? Avoid bending over, especially after eating.  ? Raise the head of your bed by putting blocks under the legs. This keeps your head and esophagus higher than your stomach.  ? Do not wear tight clothing around your chest or stomach.  ?   Try not to strain when having a bowel movement, when urinating, or when lifting heavy objects.  Eating and drinking  · Avoid foods that can worsen GERD symptoms. These may include:  ? Fatty foods, like fried foods.  ? Citrus fruits, like oranges or lemon.  ? Other foods and drinks that contain acid, like orange juice or tomatoes.  ? Spicy food.  ? Chocolate.  · Eat frequent small meals instead of three large meals a day. This helps prevent your stomach from getting too full.  ? Eat slowly.  ? Do not lie down right after eating.  ? Do not eat 1-2 hours before bed.  · Do not drink beverages with caffeine. These include cola, coffee, cocoa, and tea.  · Do not drink alcohol.  General  instructions  · Take over-the-counter and prescription medicines only as told by your health care provider.  · Keep all follow-up visits as told by your health care provider. This is important.  Contact a health care provider if:  · Your symptoms are not controlled with medicines or lifestyle changes.  · You are having trouble swallowing.  · You have coughing or wheezing that will not go away.  Get help right away if:  · Your pain is getting worse.  · Your pain spreads to your arms, neck, jaw, teeth, or back.  · You have shortness of breath.  · You sweat for no reason.  · You feel sick to your stomach (nauseous) or you vomit.  · You vomit blood.  · You have bright red blood in your stools.  · You have black, tarry stools.  This information is not intended to replace advice given to you by your health care provider. Make sure you discuss any questions you have with your health care provider.  Document Released: 06/05/2003 Document Revised: 03/08/2016 Document Reviewed: 03/08/2016  Elsevier Interactive Patient Education © 2018 [employer]

## 2017-09-08 NOTE — Progress Notes (Signed)
GYNECOLOGY PROGRESS NOTE  Subjective:    Patient ID: Candace [name], female    DOB: [birth year], 67 y.o.   MRN: [id number]  HPI  Patient is a 68 y.o. G28P3200 female with PMH of CAD with prior CABG, HTN, unstable angina, and DM who presents for 2 weeks post-op s/p TAH/BSO for fibroid uterus and pelvic pain with several complaints:   1. Lightheadedness.  Patient notes that she still has episodes of light-headedness.  Was seen by her PCP last week after my recommendations to hold one of her BP meds due to her symptoms, as she was having hypotensive episodes at night.  Notes that her PCP told her to stop taking her HCTZ.  Patient notes that even on just one BP med now, she is still noting labile BPs at different times during the day.  2. Epigastric/mid-sternal pain - has been ongoing since  ~ 1 week after her surgery. Notes that the pain is sometimes associated with eating, however does not occur every time. Of note her daughter reports that she has not had much of an appetite lately, and is often feeling full. The pain is described as achy, lasts for several minutes and then resolves. Occurs several times a day, sometimes also associated with certain movements. Tried taking a Tylenol #3 but notes it did not help much. Notes her PCP thinks that she may have pulled a muscle in the chest as patient notes that she has had some coughing, however patient note the pain began before she had the cough. Reports that her Cardiologist does not think her pain is cardiac related. 3. Constipation - patient initially with diarrhea last week after taking laxatives once discharged from the hospital, followed by another round of constipation after taking anti-motility medications to stop the diarrhea.  Notes that she just had a bowel movement late yesterday after taking a stool softener.  She is also concerned that her stool is darker.  4. Vaginal bleeding - patient reports an episode of small amount of vaginal bleeding  (light pink blood in her underwear).   5. Dysuria - patient reports dysuria over the past several days.  Notes that she is trying to adequately hydrate.  Worried that she may have a UTI.   The following portions of the patient's history were reviewed and updated as appropriate: allergies, current medications, past family history, past medical history, past social history, past surgical history and problem list.  Review of Systems Pertinent items noted in HPI and remainder of comprehensive ROS otherwise negative.   Objective:   Blood pressure 124/73, pulse 88, height 5\' 2"  (1.575 m), weight 168 lb 8 oz (76.4 kg). General appearance: alert and no distress  Heart: regular rate and rhythm, S1, S2 normal, no murmur, click, rub or gallop Chest: mildly tender along mid-sternum and towards left breast Abdomen: normal findings: bowel sounds normal, no masses palpable and soft.  Incision healing well, no erythema, drainage, or tenderness and abnormal findings:  mild tenderness in the epigastrium with slightly palpable bulge.  No suprapubic tenderness.  Pelvic: external genitalia normal, rectovaginal septum normal and vagina normal without discharge.  Vaginal cuff healing well. No blood in vaginal vault. Bimanual exam not peformed.  Extremities: extremities normal, atraumatic, no cyanosis or edema Neurologic: Grossly normal   Labs:  Results for orders placed or performed in visit on 09/08/17  POCT urinalysis dipstick  Result Value Ref Range   Color, UA yellow    Clarity, UA clear  Glucose, UA Negative Negative   Bilirubin, UA neg    Ketones, UA neg    Spec Grav, UA 1.025 1.010 - 1.025   Blood, UA large    pH, UA 6.0 5.0 - 8.0   Protein, UA Positive (A) Negative   Urobilinogen, UA 0.2 0.2 or 1.0 E.U./dL   Nitrite, UA neg    Leukocytes, UA Moderate (2+) (A) Negative   Appearance yellow    Odor      Assessment:    1. Lightheadedness.  2. Epigastric/mid-sternal pain 3. Constipation    4. Vaginal bleeding    5. Dysuria  6. Post-operative state  Plan:  1. Lightheadedness -may likely be secondary to her blood pressure being labile.  Patient notes that her blood pressures get as high as 150s-170s/90s and as low as the 90s/60s.  She has recently discontinued her HCTZ.  Discussed that she may be more sensitive to her current blood pressure medicines after surgery.  She has been seen by her PCP, who recommends further follow-up.  She may require a lower dosage and her current blood pressure medicine.  Advised patient being very careful when changing positions. 2. Epigastric/mid-sternal pain -I suspect that the midsternal pain may be related to costochondritis, as patient is tender to palpation along the sternal region and across the left chest.  Patient initially did not experience epigastric pain at examination, however when she attempted to lift from the exam table, patient noted excruciating midsternal pain.  Upon palpation there was noted to be a slight bulge.  Patient is known to have a hiatal hernia which up until this point had not been bothering her.  It is possible that the epigastric pain is related to that.  I discussed the sparing use of ibuprofen (as patient is on blood thinners) to help with costochondritis pain.  Also advised patient on intake of smaller portions with more frequent meal times, consumption of nutritional supplements such as Boost or Ensure, and reduction of potential irritants.  I also discussed the use of antacids as needed.  If pain does not improve patient may need to be assessed by a GI physician.  Has been ruled out by her cardiologist for cardiac origin of pain. 3. Constipation -patient initially with constipation however has recently had a bowel movement.  Advised to continue the use of her stool softener with her iron intake.  Reassured patient that her stools were not dark because of her use of oral iron supplements. 4. Vaginal bleeding  -no vaginal  bleeding noted on exam today, however there was some blood noted on her urinalysis, however no other evidence of a UTI present.  May have likely been from vaginal source.  Discussed with patient that as she is still early in her postoperative recovery, that she may occasionally have some spotting as the vaginal cuff continues to heal. 5. Dysuria -as noted above patient with dysuria however no evidence of UTI noted on today's UA.  Advised on continued hydration.  Patient knows that she was also retracted during Ashlon Lottman juice, as I discussed that cranberry juice may be an irritant to her epigastric pain.  Patient to follow-up in 3 weeks for final postop check.  Follow-up sooner if symptoms worsen or do not improve.     Rubie Maid, MD [facility]

## 2017-09-08 NOTE — Patient Instructions (Signed)
Stop hydrochlorothiazide (HCTZ).

## 2017-09-08 NOTE — Progress Notes (Signed)
Pt stated that when she has a  bowel movement it hurts and her stool is dark. Pt also stated that she noticed vaginal bleeding light pink blood in her underwear. Pt also stated that she do not have an appetite but feels like she is full all the time. Pt stated that she has sharp pains in her mid-rib cage. Pt stated of being lightheaded at times and not feeling well.

## 2017-09-08 NOTE — Progress Notes (Signed)
Patient: Candace [name] Female    DOB: 04-Dec-1950   67 y.o.   MRN: [id number] Visit Date: 09/08/2017  Today's Provider: [name], MD   Chief Complaint  Patient presents with  . Chest Pain   Subjective:    HPI The patient is a 67 year old female who presents today with generalized malaise, fatigue, an occasional chest pain. She reports that the pain sometimes radiates to her upper left shoulder.   Patient denies nausea, numbness or tingling in her extremities, or headaches. She denies any injuries or heavy lifting. She does mention that she was advised by GYN to hold her evening dose of Metoprolol and hold HCTZ due to HYPOtension. However, patient reports that since her BP has been running high for the last week, she has gone back to her daily regime. She did not take the HCTZ this morning because she reports that her BP was running low.      Allergies  Allergen Reactions  . Lisinopril     cough  . Penicillins Swelling, Rash and Other (See Comments)    Has patient had a PCN reaction causing immediate rash, facial/tongue/throat swelling, SOB or lightheadedness with hypotension: Yes Has patient had a PCN reaction causing severe rash involving mucus membranes or skin necrosis: Yes Has patient had a PCN reaction that required hospitalization No Has patient had a PCN reaction occurring within the last 10 years: Yes If all of the above answers are "NO", then may proceed with Cephalosporin use.      Current Outpatient Medications:  .  acetaminophen (TYLENOL) 500 MG tablet, Take 2 tablets (1,000 mg total) by mouth every 8 (eight) hours as needed., Disp: 60 tablet, Rfl: 1 .  albuterol (PROVENTIL HFA;VENTOLIN HFA) 108 (90 Base) MCG/ACT inhaler, Inhale 2 puffs into the lungs every 6 (six) hours as needed for wheezing or shortness of breath. (Patient not taking: Reported on 08/15/2017), Disp: 1 Inhaler, Rfl: 2 .  aspirin 81 MG chewable tablet, Chew 1 tablet (81 mg total) by  mouth daily., Disp: 90 tablet, Rfl: 3 .  clopidogrel (PLAVIX) 75 MG tablet, Take 75 mg by mouth daily., Disp: , Rfl:  .  Coenzyme Q10 (CO Q 10) 100 MG CAPS, Take 100 mg by mouth daily., Disp: 30 capsule, Rfl: 12 .  docusate sodium (COLACE) 100 MG capsule, Take 1 capsule (100 mg total) by mouth 2 (two) times daily as needed., Disp: 30 capsule, Rfl: 2 .  ferrous sulfate 325 (65 FE) MG tablet, Take 1 tablet (325 mg total) by mouth daily with breakfast., Disp: 30 tablet, Rfl: 1 .  hydrochlorothiazide (MICROZIDE) 12.5 MG capsule, Take 12.5 mg by mouth daily., Disp: , Rfl:  .  isosorbide mononitrate (IMDUR) 30 MG 24 hr tablet, Take 2 tablets (60 mg total) by mouth daily. (Patient taking differently: Take 30 mg by mouth 2 (two) times daily. ), Disp: 60 tablet, Rfl: 12 .  loperamide (IMODIUM) 2 MG capsule, Take by mouth as needed for diarrhea or loose stools., Disp: , Rfl:  .  metFORMIN (GLUCOPHAGE) 1000 MG tablet, Take 1 tablet (1,000 mg total) by mouth 2 (two) times daily with a meal., Disp: 180 tablet, Rfl: 3 .  metoprolol tartrate (LOPRESSOR) 50 MG tablet, TAKE 1 TABLET BY MOUTH TWICE A DAY FOR HIGH BLOOD PRESSURE, Disp: 60 tablet, Rfl: 12 .  nitroGLYCERIN (NITROSTAT) 0.4 MG SL tablet, Place 1 tablet (0.4 mg total) under the tongue every 5 (five) minutes as  needed for chest pain., Disp: 30 tablet, Rfl: 0 .  ondansetron (ZOFRAN-ODT) 8 MG disintegrating tablet, DISSOLVE 1 TABLET ON THE TONGUE EVERY 8 HOURS AS NEEDED FOR NAUSEA OR VOMITING, Disp: 30 tablet, Rfl: 3 .  oxyCODONE (OXY IR/ROXICODONE) 5 MG immediate release tablet, Take 1 tablet (5 mg total) by mouth every 6 (six) hours as needed for moderate pain., Disp: 20 tablet, Rfl: 0 .  rosuvastatin (CRESTOR) 10 MG tablet, Take 1 tablet (10 mg total) by mouth daily., Disp: 90 tablet, Rfl: 3 .  tiZANidine (ZANAFLEX) 2 MG tablet, Take 2 mg by mouth at bedtime. , Disp: , Rfl:   Review of Systems  Constitutional: Positive for activity change and fatigue.    Eyes: Negative.   Respiratory: Positive for shortness of breath. Negative for apnea and wheezing.   Cardiovascular: Positive for chest pain. Negative for palpitations and leg swelling.  Gastrointestinal: Negative.   Endocrine: Negative for cold intolerance, heat intolerance, polydipsia, polyphagia and polyuria.  Musculoskeletal: Positive for arthralgias, back pain and myalgias.  Allergic/Immunologic: Negative.   Neurological: Positive for light-headedness.  Psychiatric/Behavioral: Positive for sleep disturbance. The patient is nervous/anxious.     Social History   Tobacco Use  . Smoking status: Former Smoker    Packs/day: 0.50    Types: Cigarettes    Last attempt to quit: 10/07/2001    Years since quitting: 15.9  . Smokeless tobacco: Never Used  Substance Use Topics  . Alcohol use: No    Alcohol/week: 0.0 oz   Objective:   BP 94/62 (BP Location: Right Arm, Patient Position: Sitting, Cuff Size: Normal)   Pulse 78   Temp 98.7 F (37.1 C)   Resp 16   Wt 164 lb (74.4 kg)   SpO2 97%   BMI 30.00 kg/m  Vitals:   09/08/17 1010  BP: 94/62  Pulse: 78  Resp: 16  Temp: 98.7 F (37.1 C)  SpO2: 97%  Weight: 164 lb (74.4 kg)     Physical Exam  Constitutional: She is oriented to person, place, and time. She appears well-developed and well-nourished.  HENT:  Head: Normocephalic and atraumatic.  Eyes: Pupils are equal, round, and reactive to light.  Cardiovascular: Normal rate and regular rhythm.  Pulmonary/Chest: Effort normal and breath sounds normal.  Abdominal: Soft.  Lymphadenopathy:    She has no cervical adenopathy.  Neurological: She is alert and oriented to person, place, and time.  Skin: Skin is warm and dry.  Psychiatric: She has a normal mood and affect. Her behavior is normal. Judgment and thought content normal.        Assessment & Plan:     1. Chest pain, unspecified type  - EKG 12-Lead  2. Anemia due to vitamin B12 deficiency, unspecified B12  deficiency type  - CBC with Differential/Platelet - Iron  3. Chronic fatigue  - Comprehensive metabolic panel  4. Coronary artery disease of bypass graft of native heart with stable angina pectoris (HCC)  - Comprehensive metabolic panel 5.Chronic Anxiety     I have done the exam and reviewed the above chart and it is accurate to the best of my knowledge. Development worker, community has been used in this note in any air is in the dictation or transcription are unintentional.  [name], MD  [facility]

## 2017-09-09 ENCOUNTER — Other Ambulatory Visit: Payer: Self-pay | Admitting: Obstetrics and Gynecology

## 2017-09-09 LAB — COMPREHENSIVE METABOLIC PANEL
ALT: 8 IU/L (ref 0–32)
AST: 10 IU/L (ref 0–40)
Albumin/Globulin Ratio: 1.8 (ref 1.2–2.2)
Albumin: 4.6 g/dL (ref 3.6–4.8)
Alkaline Phosphatase: 110 IU/L (ref 39–117)
BUN/Creatinine Ratio: 21 (ref 12–28)
BUN: 18 mg/dL (ref 8–27)
Bilirubin Total: 0.2 mg/dL (ref 0.0–1.2)
CO2: 21 mmol/L (ref 20–29)
Calcium: 9.9 mg/dL (ref 8.7–10.3)
Chloride: 98 mmol/L (ref 96–106)
Creatinine, Ser: 0.87 mg/dL (ref 0.57–1.00)
GFR calc Af Amer: 80 mL/min/{1.73_m2} (ref >59)
GFR calc non Af Amer: 70 mL/min/{1.73_m2} (ref >59)
Globulin, Total: 2.5 g/dL (ref 1.5–4.5)
Glucose: 94 mg/dL (ref 65–99)
Potassium: 3.9 mmol/L (ref 3.5–5.2)
Sodium: 140 mmol/L (ref 134–144)
Total Protein: 7.1 g/dL (ref 6.0–8.5)

## 2017-09-09 LAB — CBC WITH DIFFERENTIAL/PLATELET
Basophils Absolute: 0 10*3/uL (ref 0.0–0.2)
Basos: 0 %
EOS (ABSOLUTE): 0.2 10*3/uL (ref 0.0–0.4)
Eos: 3 %
Hematocrit: 33.2 % — ABNORMAL LOW (ref 34.0–46.6)
Hemoglobin: 11.2 g/dL (ref 11.1–15.9)
Immature Grans (Abs): 0 10*3/uL (ref 0.0–0.1)
Immature Granulocytes: 0 %
Lymphocytes Absolute: 2.8 10*3/uL (ref 0.7–3.1)
Lymphs: 35 %
MCH: 31.9 pg (ref 26.6–33.0)
MCHC: 33.7 g/dL (ref 31.5–35.7)
MCV: 95 fL (ref 79–97)
Monocytes Absolute: 0.7 10*3/uL (ref 0.1–0.9)
Monocytes: 9 %
Neutrophils Absolute: 4.3 10*3/uL (ref 1.4–7.0)
Neutrophils: 53 %
Platelets: 348 10*3/uL (ref 150–450)
RBC: 3.51 x10E6/uL — ABNORMAL LOW (ref 3.77–5.28)
RDW: 14.6 % (ref 12.3–15.4)
WBC: 8 10*3/uL (ref 3.4–10.8)

## 2017-09-09 LAB — IRON: Iron: 44 ug/dL (ref 27–139)

## 2017-09-10 ENCOUNTER — Encounter: Payer: Self-pay | Admitting: Obstetrics and Gynecology

## 2017-09-12 ENCOUNTER — Telehealth: Payer: Self-pay

## 2017-09-12 NOTE — Telephone Encounter (Signed)
-----   Message from [name]., MD sent at 09/09/2017 12:44 PM EDT ----- Labs OK.

## 2017-09-12 NOTE — Telephone Encounter (Signed)
Patient advised as below.  

## 2017-09-21 ENCOUNTER — Ambulatory Visit: Payer: Medicaid Other | Admitting: Family Medicine

## 2017-09-22 ENCOUNTER — Encounter: Payer: Self-pay | Admitting: Family Medicine

## 2017-09-22 ENCOUNTER — Encounter: Payer: Self-pay | Admitting: General Surgery

## 2017-09-22 ENCOUNTER — Ambulatory Visit
Admission: RE | Admit: 2017-09-22 | Discharge: 2017-09-22 | Disposition: A | Discharge status: Home / Self Care | Payer: Medicare Other | Source: Ambulatory Visit | Attending: Family Medicine | Admitting: Family Medicine

## 2017-09-22 ENCOUNTER — Ambulatory Visit (INDEPENDENT_AMBULATORY_CARE_PROVIDER_SITE_OTHER): Payer: Medicare Other | Admitting: Family Medicine

## 2017-09-22 VITALS — BP 124/60 | HR 69 | Temp 98.3°F | Resp 16 | Wt 168.0 lb

## 2017-09-22 DIAGNOSIS — F418 Other specified anxiety disorders: Secondary | ICD-10-CM

## 2017-09-22 DIAGNOSIS — R112 Nausea with vomiting, unspecified: Secondary | ICD-10-CM | POA: Diagnosis not present

## 2017-09-22 DIAGNOSIS — Z8719 Personal history of other diseases of the digestive system: Secondary | ICD-10-CM | POA: Diagnosis not present

## 2017-09-22 DIAGNOSIS — I2510 Atherosclerotic heart disease of native coronary artery without angina pectoris: Secondary | ICD-10-CM | POA: Diagnosis not present

## 2017-09-22 DIAGNOSIS — K219 Gastro-esophageal reflux disease without esophagitis: Secondary | ICD-10-CM

## 2017-09-22 DIAGNOSIS — I999 Unspecified disorder of circulatory system: Secondary | ICD-10-CM | POA: Diagnosis not present

## 2017-09-22 DIAGNOSIS — R1010 Upper abdominal pain, unspecified: Secondary | ICD-10-CM | POA: Diagnosis not present

## 2017-09-22 DIAGNOSIS — R1013 Epigastric pain: Secondary | ICD-10-CM

## 2017-09-22 MED ORDER — OMEPRAZOLE 20 MG PO CPDR: 20.0000 mg | DELAYED_RELEASE_CAPSULE | Freq: Every day | ORAL | 3 refills | Status: DC

## 2017-09-22 NOTE — Patient Instructions (Signed)
Start taking Omeprazole and follow up in 2 weeks for recheck.

## 2017-09-22 NOTE — Progress Notes (Signed)
Patient: Candace [name] Female    DOB: July 14, 1950   67 y.o.   MRN: [id number] Visit Date: 09/22/2017  Today's Provider: [name], MD   Chief Complaint  Patient presents with  . Follow-up   Subjective:    HPI Follow up: Patient is here today for a 2 week follow up appointment,. Changes made during the last visit includes stopping HCTZ. Patient reports good compliance with treatment. Today she complains of an abdominal hernia. She has had upper abdominal pain, nausea and vomiting.    Allergies  Allergen Reactions  . Lisinopril     cough  . Penicillins Swelling, Rash and Other (See Comments)    Has patient had a PCN reaction causing immediate rash, facial/tongue/throat swelling, SOB or lightheadedness with hypotension: Yes Has patient had a PCN reaction causing severe rash involving mucus membranes or skin necrosis: Yes Has patient had a PCN reaction that required hospitalization No Has patient had a PCN reaction occurring within the last 10 years: Yes If all of the above answers are "NO", then may proceed with Cephalosporin use.      Current Outpatient Medications:  .  acetaminophen (TYLENOL) 500 MG tablet, Take 2 tablets (1,000 mg total) by mouth every 8 (eight) hours as needed., Disp: 60 tablet, Rfl: 1 .  albuterol (PROVENTIL HFA;VENTOLIN HFA) 108 (90 Base) MCG/ACT inhaler, Inhale 2 puffs into the lungs every 6 (six) hours as needed for wheezing or shortness of breath., Disp: 1 Inhaler, Rfl: 2 .  aspirin 81 MG chewable tablet, Chew 1 tablet (81 mg total) by mouth daily., Disp: 90 tablet, Rfl: 3 .  clopidogrel (PLAVIX) 75 MG tablet, Take 75 mg by mouth daily., Disp: , Rfl:  .  Coenzyme Q10 (CO Q 10) 100 MG CAPS, Take 100 mg by mouth daily., Disp: 30 capsule, Rfl: 12 .  docusate sodium (COLACE) 100 MG capsule, Take 1 capsule (100 mg total) by mouth 2 (two) times daily as needed., Disp: 30 capsule, Rfl: 2 .  isosorbide mononitrate (IMDUR) 30 MG 24 hr tablet, Take  2 tablets (60 mg total) by mouth daily. (Patient taking differently: Take 30 mg by mouth 2 (two) times daily. ), Disp: 60 tablet, Rfl: 12 .  loperamide (IMODIUM) 2 MG capsule, Take by mouth as needed for diarrhea or loose stools., Disp: , Rfl:  .  metFORMIN (GLUCOPHAGE) 1000 MG tablet, Take 1 tablet (1,000 mg total) by mouth 2 (two) times daily with a meal., Disp: 180 tablet, Rfl: 3 .  metoprolol tartrate (LOPRESSOR) 50 MG tablet, TAKE 1 TABLET BY MOUTH TWICE A DAY FOR HIGH BLOOD PRESSURE, Disp: 60 tablet, Rfl: 12 .  nitroGLYCERIN (NITROSTAT) 0.4 MG SL tablet, Place 1 tablet (0.4 mg total) under the tongue every 5 (five) minutes as needed for chest pain., Disp: 30 tablet, Rfl: 0 .  ondansetron (ZOFRAN-ODT) 8 MG disintegrating tablet, DISSOLVE 1 TABLET ON THE TONGUE EVERY 8 HOURS AS NEEDED FOR NAUSEA OR VOMITING, Disp: 30 tablet, Rfl: 3 .  rosuvastatin (CRESTOR) 10 MG tablet, Take 1 tablet (10 mg total) by mouth daily., Disp: 90 tablet, Rfl: 3 .  tiZANidine (ZANAFLEX) 2 MG tablet, Take 2 mg by mouth at bedtime. , Disp: , Rfl:  .  ferrous sulfate 325 (65 FE) MG tablet, TAKE 1 TABLET BY MOUTH EVERY DAY WITH BREAKFAST (Patient not taking: Reported on 09/22/2017), Disp: 30 tablet, Rfl: 0 .  oxyCODONE (OXY IR/ROXICODONE) 5 MG immediate release tablet, Take 1 tablet (5  mg total) by mouth every 6 (six) hours as needed for moderate pain. (Patient not taking: Reported on 09/22/2017), Disp: 20 tablet, Rfl: 0  Review of Systems  Constitutional: Negative for appetite change, chills, fatigue and fever.  HENT: Negative.   Eyes: Negative.   Respiratory: Negative for chest tightness and shortness of breath.   Cardiovascular: Negative for chest pain and palpitations.  Gastrointestinal: Positive for abdominal pain, nausea and vomiting.  Endocrine: Negative.   Musculoskeletal: Positive for arthralgias.  Allergic/Immunologic: Negative.   Neurological: Negative for dizziness and weakness.  Hematological: Negative.     Psychiatric/Behavioral: Positive for agitation.    Social History   Tobacco Use  . Smoking status: Former Smoker    Packs/day: 0.50    Types: Cigarettes    Last attempt to quit: 10/07/2001    Years since quitting: 15.9  . Smokeless tobacco: Never Used  Substance Use Topics  . Alcohol use: No    Alcohol/week: 0.0 oz   Objective:   BP 124/60 (BP Location: Right Arm, Patient Position: Sitting, Cuff Size: Large)   Pulse 69   Temp 98.3 F (36.8 C) (Oral)   Resp 16   Wt 168 lb (76.2 kg)   SpO2 99% Comment: room air  BMI 30.73 kg/m  Vitals:   09/22/17 1350  BP: 124/60  Pulse: 69  Resp: 16  Temp: 98.3 F (36.8 C)  TempSrc: Oral  SpO2: 99%  Weight: 168 lb (76.2 kg)     Physical Exam  Constitutional: She is oriented to person, place, and time. She appears well-developed and well-nourished.  HENT:  Head: Normocephalic and atraumatic.  Right Ear: External ear normal.  Left Ear: External ear normal.  Nose: Nose normal.  Eyes: Conjunctivae are normal. No scleral icterus.  Neck: No thyromegaly present.  Cardiovascular: Normal rate, regular rhythm and normal heart sounds.  Pulmonary/Chest: Effort normal and breath sounds normal.  Abdominal: Soft.  Mild tenderness of epigastrum over very specific spots.  Musculoskeletal: She exhibits no edema.  Neurological: She is alert and oriented to person, place, and time.  Skin: Skin is warm and dry.  Psychiatric: She has a normal mood and affect. Her behavior is normal. Judgment and thought content normal.        Assessment & Plan:     1. Pain of upper abdomen Gastritis vs PUD vs Abdominal hernias.RTC 2 weeks. - DG Abd 1 View; Future - omeprazole (PRILOSEC) 20 MG capsule; Take 1 capsule (20 mg total) by mouth daily.  Dispense: 30 capsule; Refill: 3  2. Nausea and vomiting, intractability of vomiting not specified, unspecified vomiting type R/o SBO. This is unlikely. Labs recently completely WNL. - DG Abd 1 View;  Future  3. Epigastric pain  - Ambulatory referral to General Surgery  4. History of abdominal hernia Dr Marcelline Mates felt she has small hernias. - Ambulatory referral to General Surgery 5.s/p CABG. 6.MDD/GAD     I have done the exam and reviewed the above chart and it is accurate to the best of my knowledge. Development worker, community has been used in this note in any air is in the dictation or transcription are unintentional.  [name], MD  [place]

## 2017-09-27 ENCOUNTER — Encounter: Payer: Medicare Other | Admitting: Obstetrics and Gynecology

## 2017-10-05 ENCOUNTER — Telehealth: Payer: Self-pay | Admitting: Obstetrics and Gynecology

## 2017-10-05 NOTE — Telephone Encounter (Signed)
Patient left a voice mail at 8:56 today asking - How long after a hysterectomy do you still see bloody show and have a bad odor?  She desires a call back at [phone], please advise, thanks.

## 2017-10-06 ENCOUNTER — Ambulatory Visit (INDEPENDENT_AMBULATORY_CARE_PROVIDER_SITE_OTHER): Payer: Medicare Other | Admitting: Family Medicine

## 2017-10-06 ENCOUNTER — Encounter: Payer: Self-pay | Admitting: Family Medicine

## 2017-10-06 VITALS — BP 104/62 | HR 76 | Temp 98.6°F | Resp 16 | Ht 62.0 in | Wt 167.0 lb

## 2017-10-06 DIAGNOSIS — R1010 Upper abdominal pain, unspecified: Secondary | ICD-10-CM | POA: Diagnosis not present

## 2017-10-06 DIAGNOSIS — K219 Gastro-esophageal reflux disease without esophagitis: Secondary | ICD-10-CM | POA: Diagnosis not present

## 2017-10-06 DIAGNOSIS — R112 Nausea with vomiting, unspecified: Secondary | ICD-10-CM | POA: Diagnosis not present

## 2017-10-06 MED ORDER — SUCRALFATE 1 G PO TABS: 1.0000 g | ORAL_TABLET | Freq: Two times a day (BID) | ORAL | 5 refills | Status: DC

## 2017-10-06 NOTE — Progress Notes (Signed)
Patient: Candace [name] Female    DOB: 1950-12-22   67 y.o.   MRN: [id number] Visit Date: 10/06/2017  Today's Provider: [name], MD   Chief Complaint  Patient presents with  . Abdominal Pain  . Nausea   Subjective:    HPI Patient comes in today for a 2 week follow up. Patient was seen due to abdominal pain and nausea. She was prescribed omeprazole 20mg  daily. She reports that she has not noticed an improvement in her symptoms. She also mentions that she vomited last night, and she still feels nauseated this morning.   She is scheduled to see general surgery on 7/30 for evaluation. Her abdominal xray on 09/22/17 showed some constipation, but no obstruction.  Pain is very vague and has no associated symptoms presently.No chest pain.    Allergies  Allergen Reactions  . Lisinopril     cough  . Penicillins Swelling, Rash and Other (See Comments)    Has patient had a PCN reaction causing immediate rash, facial/tongue/throat swelling, SOB or lightheadedness with hypotension: Yes Has patient had a PCN reaction causing severe rash involving mucus membranes or skin necrosis: Yes Has patient had a PCN reaction that required hospitalization No Has patient had a PCN reaction occurring within the last 10 years: Yes If all of the above answers are "NO", then may proceed with Cephalosporin use.      Current Outpatient Medications:  .  acetaminophen (TYLENOL) 500 MG tablet, Take 2 tablets (1,000 mg total) by mouth every 8 (eight) hours as needed., Disp: 60 tablet, Rfl: 1 .  albuterol (PROVENTIL HFA;VENTOLIN HFA) 108 (90 Base) MCG/ACT inhaler, Inhale 2 puffs into the lungs every 6 (six) hours as needed for wheezing or shortness of breath., Disp: 1 Inhaler, Rfl: 2 .  aspirin 81 MG chewable tablet, Chew 1 tablet (81 mg total) by mouth daily., Disp: 90 tablet, Rfl: 3 .  clopidogrel (PLAVIX) 75 MG tablet, Take 75 mg by mouth daily., Disp: , Rfl:  .  Coenzyme Q10 (CO Q 10) 100 MG  CAPS, Take 100 mg by mouth daily., Disp: 30 capsule, Rfl: 12 .  docusate sodium (COLACE) 100 MG capsule, Take 1 capsule (100 mg total) by mouth 2 (two) times daily as needed., Disp: 30 capsule, Rfl: 2 .  isosorbide mononitrate (IMDUR) 30 MG 24 hr tablet, Take 2 tablets (60 mg total) by mouth daily. (Patient taking differently: Take 30 mg by mouth 2 (two) times daily. ), Disp: 60 tablet, Rfl: 12 .  loperamide (IMODIUM) 2 MG capsule, Take by mouth as needed for diarrhea or loose stools., Disp: , Rfl:  .  metFORMIN (GLUCOPHAGE) 1000 MG tablet, Take 1 tablet (1,000 mg total) by mouth 2 (two) times daily with a meal., Disp: 180 tablet, Rfl: 3 .  metoprolol tartrate (LOPRESSOR) 50 MG tablet, TAKE 1 TABLET BY MOUTH TWICE A DAY FOR HIGH BLOOD PRESSURE, Disp: 60 tablet, Rfl: 12 .  nitroGLYCERIN (NITROSTAT) 0.4 MG SL tablet, Place 1 tablet (0.4 mg total) under the tongue every 5 (five) minutes as needed for chest pain., Disp: 30 tablet, Rfl: 0 .  omeprazole (PRILOSEC) 20 MG capsule, Take 1 capsule (20 mg total) by mouth daily., Disp: 30 capsule, Rfl: 3 .  ondansetron (ZOFRAN-ODT) 8 MG disintegrating tablet, DISSOLVE 1 TABLET ON THE TONGUE EVERY 8 HOURS AS NEEDED FOR NAUSEA OR VOMITING, Disp: 30 tablet, Rfl: 3 .  rosuvastatin (CRESTOR) 10 MG tablet, Take 1 tablet (10 mg total)  by mouth daily., Disp: 90 tablet, Rfl: 3 .  tiZANidine (ZANAFLEX) 2 MG tablet, Take 2 mg by mouth at bedtime. , Disp: , Rfl:  .  oxyCODONE (OXY IR/ROXICODONE) 5 MG immediate release tablet, Take 1 tablet (5 mg total) by mouth every 6 (six) hours as needed for moderate pain. (Patient not taking: Reported on 09/22/2017), Disp: 20 tablet, Rfl: 0  Review of Systems  Constitutional: Positive for activity change, appetite change and fatigue.  HENT: Negative.   Eyes: Negative.   Respiratory: Negative.   Cardiovascular: Negative.   Gastrointestinal: Positive for abdominal pain, diarrhea, nausea and vomiting. Negative for abdominal distention,  anal bleeding, blood in stool, constipation and rectal pain.  Endocrine: Negative for cold intolerance, heat intolerance, polydipsia, polyphagia and polyuria.  Genitourinary: Negative for decreased urine volume, dysuria and hematuria.  Skin: Negative.   Neurological: Positive for light-headedness. Negative for dizziness, weakness and headaches.  Psychiatric/Behavioral: Negative for agitation, self-injury, sleep disturbance and suicidal ideas. The patient is not nervous/anxious.     Social History   Tobacco Use  . Smoking status: Former Smoker    Packs/day: 0.50    Types: Cigarettes    Last attempt to quit: 10/07/2001    Years since quitting: 16.0  . Smokeless tobacco: Never Used  Substance Use Topics  . Alcohol use: No    Alcohol/week: 0.0 oz   Objective:   BP 104/62 (BP Location: Right Arm, Patient Position: Sitting, Cuff Size: Normal)   Temp 98.6 F (37 C)   Resp 16   Ht 5\' 2"  (1.575 m)   Wt 167 lb (75.8 kg)   BMI 30.54 kg/m  Vitals:   10/06/17 0921  BP: 104/62  Resp: 16  Temp: 98.6 F (37 C)  Weight: 167 lb (75.8 kg)  Height: 5\' 2"  (1.575 m)     Physical Exam  Constitutional: She is oriented to person, place, and time. She appears well-developed and well-nourished.  HENT:  Head: Normocephalic and atraumatic.  Eyes: No scleral icterus.  Pulmonary/Chest: Effort normal and breath sounds normal.  Abdominal: Soft. Normal appearance and bowel sounds are normal.  Neurological: She is alert and oriented to person, place, and time.  Skin: Skin is warm and dry.  Psychiatric: She has a normal mood and affect. Her behavior is normal.        Assessment & Plan:     1. Pain of upper abdomen/Gastritis Try sucralfate QID. Pt s/p C Section times 3,BTL,Hysterectomy,Appendectomy. 2. Nausea and vomiting, intractability of vomiting not specified, unspecified vomiting type   3. Gastroesophageal reflux disease, esophagitis presence not specified  - sucralfate (CARAFATE) 1 g  tablet; Take 1 tablet (1 g total) by mouth 2 (two) times daily.  Dispense: 60 tablet; Refill: 5 4.CAD 5.MDD/GAD       I have done the exam and reviewed the above chart and it is accurate to the best of my knowledge. Development worker, community has been used in this note in any air is in the dictation or transcription are unintentional.  [name], MD  [place]

## 2017-10-06 NOTE — Telephone Encounter (Signed)
Pt was called back concerning her issues. Pt was informed that [facility] wanted her to follow up in 3 weeks after her last visit on 09/08/17. Pt was advised to use mild soap like Dove or feminine wash to clean her vaginal area. Pt was made her follow up appt with [facility] today.

## 2017-10-19 ENCOUNTER — Encounter: Payer: Self-pay | Admitting: *Deleted

## 2017-10-25 ENCOUNTER — Encounter: Payer: Self-pay | Admitting: General Surgery

## 2017-10-25 ENCOUNTER — Ambulatory Visit (INDEPENDENT_AMBULATORY_CARE_PROVIDER_SITE_OTHER): Payer: Medicare Other | Admitting: General Surgery

## 2017-10-25 VITALS — BP 132/72 | HR 72 | Resp 12 | Ht 62.0 in | Wt 167.0 lb

## 2017-10-25 DIAGNOSIS — R1013 Epigastric pain: Secondary | ICD-10-CM | POA: Diagnosis not present

## 2017-10-25 DIAGNOSIS — R112 Nausea with vomiting, unspecified: Secondary | ICD-10-CM

## 2017-10-25 NOTE — Progress Notes (Signed)
Patient ID: Candace [name], female   DOB: [birth year], 67 y.o.   MRN: [id number]  Chief Complaint  Patient presents with  . Other    HPI Candace [name] is a 67 y.o. female here today for a evaluation of a epigastric pain. Patient noticed this area about six months ago.She has had some nausea and vomiting . Pain last for about 30 minutes.  She has four episodes  in the last month.  Sometimes she wakes up in the middle of the night.  Feels like a  sharpe stabbing pain with occasional radiations to her back.  Frequency of episodes are increasing, now occurring twice per week.  The cramping, stabbing pain in the epigastrium may last up to 30 minutes.  Not clearly related to any particular food.  She may have episodes of vomiting with this.  On occasion she will notice mild difficulty with swallowing, but this has not required vomiting for relief of symptoms.  The patient reports that she had an upper endoscopy and colonoscopy with Gaylyn Cheers, MD in 2015.  These records are not available.  Had a x-ray done on 09/22/2017.  Plain film of the abdomen showed no evidence of obstruction.  Moves her bowels every other day.  Sometime she has trouble swallowing.  HPI  Past Medical History:  Diagnosis Date  . Allergy   . Anemia   . Anxiety   . Arrhythmia   . Arthritis   . Coronary artery disease   . Depression   . Diabetes mellitus without complication ([place])   . Dyspnea    doe  . Dysrhythmia   . GERD (gastroesophageal reflux disease)   . Headache   . History of hiatal hernia   . Hyperlipidemia   . Hypertension   . Myocardial infarction ([place])    2016, 04/2017  . Myocardial infarction with cardiac rehabilitation [facility])    MI 2016/ CABG 8/17    FINISHED CARDIAC REHAB 3 WEEKS AGO  . Panic attack   . Pneumonia   . Reflux   . Stroke [facility]) 2015   showed up on MRI; no weakness noted  . TIA (transient ischemic attack)   . Voice tremor     Past Surgical History:  Procedure Laterality Date   . ABDOMINAL HYSTERECTOMY    . APPENDECTOMY  1975  . ARTERY BIOPSY Right 04/26/2016   Procedure: BIOPSY TEMPORAL ARTERY;  Surgeon: Margaretha Sheffield, MD;  Location: [facility] ORS;  Service: ENT;  Laterality: Right;  . CARDIAC CATHETERIZATION N/A 11/06/2015   Procedure: Left Heart Cath and Coronary Angiography;  Surgeon: Corey Skains, MD;  Location: [place] CV LAB;  Service: Cardiovascular;  Laterality: N/A;  . CESAREAN SECTION    . COLONOSCOPY  2015  . CORONARY ANGIOPLASTY  04/2017   [place]  . CORONARY ARTERY BYPASS GRAFT N/A 11/10/2015   Procedure: CORONARY ARTERY BYPASS GRAFTING (CABG), ON PUMP, TIMES FOUR, USING LEFT INTERNAL MAMMARY ARTERY, BILATERAL GREATER SAPHENOUS VEINS HARVESTED ENDOSCOPICALLY;  Surgeon: [name], MD;  Location: [place];  Service: Open Heart Surgery;  Laterality: N/A;  LIMA-LAD; SEQ SVG-OM1-OM2; SVG-PL  . CORONARY STENT INTERVENTION N/A 08/05/2016   Procedure: Coronary Stent Intervention;  Surgeon: Isaias Cowman, MD;  Location: [place] CV LAB;  Service: Cardiovascular;  Laterality: N/A;  . HYSTERECTOMY ABDOMINAL WITH SALPINGO-OOPHORECTOMY Bilateral 08/15/2017   Procedure: HYSTERECTOMY ABDOMINAL WITH BILATERAL SALPINGO-OOPHORECTOMY;  Surgeon: Rubie Maid, MD;  Location: [facility] ORS;  Service: Gynecology;  Laterality: Bilateral;  . LEFT HEART  CATH AND CORONARY ANGIOGRAPHY N/A 08/05/2016   Procedure: Left Heart Cath and Coronary Angiography;  Surgeon: Isaias Cowman, MD;  Location: [place] CV [facility];  Service: Cardiovascular;  Laterality: N/A;  . TEE WITHOUT CARDIOVERSION N/A 11/10/2015   Procedure: TRANSESOPHAGEAL ECHOCARDIOGRAM (TEE);  Surgeon: [name], MD;  Location: [place];  Service: Open Heart Surgery;  Laterality: N/A;  . TUBAL LIGATION      Family History  Problem Relation Age of Onset  . Cancer Father   . Hypertension Father   . Heart disease Father   . Asthma Father   . Cancer Mother   . Hypertension Mother    . Pancreatic cancer Mother 58  . Cancer Sister   . Breast cancer Sister 60  . Breast cancer Sister 65  . Lung cancer Brother   . Pancreatic cancer Sister 85  . Cancer Sister     Social History Social History   Tobacco Use  . Smoking status: Former Smoker    Packs/day: 0.50    Types: Cigarettes    Last attempt to quit: 10/07/2001    Years since quitting: 16.0  . Smokeless tobacco: Never Used  Substance Use Topics  . Alcohol use: No    Alcohol/week: 0.0 oz  . Drug use: No    Allergies  Allergen Reactions  . Lisinopril     cough  . Penicillins Swelling, Rash and Other (See Comments)    Has patient had a PCN reaction causing immediate rash, facial/tongue/throat swelling, SOB or lightheadedness with hypotension: Yes Has patient had a PCN reaction causing severe rash involving mucus membranes or skin necrosis: Yes Has patient had a PCN reaction that required hospitalization No Has patient had a PCN reaction occurring within the last 10 years: Yes If all of the above answers are "NO", then may proceed with Cephalosporin use.     Current Outpatient Medications  Medication Sig Dispense Refill  . acetaminophen (TYLENOL) 500 MG tablet Take 2 tablets (1,000 mg total) by mouth every 8 (eight) hours as needed. 60 tablet 1  . albuterol (PROVENTIL HFA;VENTOLIN HFA) 108 (90 Base) MCG/ACT inhaler Inhale 2 puffs into the lungs every 6 (six) hours as needed for wheezing or shortness of breath. 1 Inhaler 2  . aspirin 81 MG chewable tablet Chew 1 tablet (81 mg total) by mouth daily. 90 tablet 3  . clopidogrel (PLAVIX) 75 MG tablet Take 75 mg by mouth daily.    . Coenzyme Q10 (CO Q 10) 100 MG CAPS Take 100 mg by mouth daily. 30 capsule 12  . cyproheptadine (PERIACTIN) 4 MG tablet Take 4 mg by mouth 3 (three) times daily as needed for allergies.    . isosorbide mononitrate (IMDUR) 30 MG 24 hr tablet Take 2 tablets (60 mg total) by mouth daily. (Patient taking differently: Take 30 mg by mouth 2  (two) times daily. ) 60 tablet 12  . loperamide (IMODIUM) 2 MG capsule Take by mouth as needed for diarrhea or loose stools.    . metFORMIN (GLUCOPHAGE) 1000 MG tablet Take 1 tablet (1,000 mg total) by mouth 2 (two) times daily with a meal. 180 tablet 3  . metoprolol tartrate (LOPRESSOR) 50 MG tablet TAKE 1 TABLET BY MOUTH TWICE A DAY FOR HIGH BLOOD PRESSURE 60 tablet 12  . nitroGLYCERIN (NITROSTAT) 0.4 MG SL tablet Place 1 tablet (0.4 mg total) under the tongue every 5 (five) minutes as needed for chest pain. 30 tablet 0  . omeprazole (PRILOSEC) 20 MG capsule Take  1 capsule (20 mg total) by mouth daily. 30 capsule 3  . ondansetron (ZOFRAN-ODT) 8 MG disintegrating tablet DISSOLVE 1 TABLET ON THE TONGUE EVERY 8 HOURS AS NEEDED FOR NAUSEA OR VOMITING 30 tablet 3  . rosuvastatin (CRESTOR) 10 MG tablet Take 1 tablet (10 mg total) by mouth daily. 90 tablet 3  . sucralfate (CARAFATE) 1 g tablet Take 1 tablet (1 g total) by mouth 2 (two) times daily. 60 tablet 5  . tiZANidine (ZANAFLEX) 2 MG tablet Take 2 mg by mouth at bedtime.      No current facility-administered medications for this visit.     Review of Systems Review of Systems  Constitutional: Negative.   Respiratory: Negative.   Cardiovascular: Negative.   Gastrointestinal: Positive for nausea and vomiting.    Blood pressure 132/72, pulse 72, resp. rate 12, height 5\' 2"  (1.575 m), weight 167 lb (75.8 kg).  Physical Exam Physical Exam  Constitutional: She is oriented to person, place, and time. She appears well-developed and well-nourished.  Cardiovascular: Normal rate, regular rhythm and normal heart sounds.  Pulses:      Carotid pulses are 2+ on the right side with bruit, and 2+ on the left side.      Femoral pulses are 2+ on the right side with bruit, and 2+ on the left side with bruit.      Dorsalis pedis pulses are 2+ on the right side, and 2+ on the left side.       Posterior tibial pulses are 2+ on the right side, and 2+ on the  left side.  No abdominal bruits are appreciated, but bilateral iliac/common femoral bruits noted.  No evidence of distal embolization.  Pulmonary/Chest: Breath sounds normal.  Abdominal: Soft. Bowel sounds are normal. There is tenderness in the right lower quadrant and epigastric area.  Neurological: She is alert and oriented to person, place, and time.  Skin: Skin is warm and dry.    Data Reviewed Plain films of the abdomen were reviewed and are unremarkable except for extensive calcification of the vessels.  Laboratory studies of September 08, 2017 showed protein in the urine.  Large amount of blood.  Moderate leukocytes.  Negative nitrite.Sample obtained during the GYN visit for dysuria. Comprehensive metabolic panel is totally normal.  CBC showed a hemoglobin of 11.2 with an MCV of 95.  White blood cell count of 8000 with normal differential. Carotid ultrasound of 2015 completed at [facility] showed nonsignificant stenosis. GI evaluation of June 06, 2015 for chronic diarrhea nausea with Laurine Blazer, PA at the [facility] reviewed.  References 2015 EGD showing erosive gastritis and pathology without evidence of H. Pylori.  Assessment    Atypical abdominal pain.  Strong family history of pancreatic cancer.    Plan   Patient to have a abdomen ultrasound . The patient is aware to call back for any questions or concerns. If this study is unremarkable, would consider CT of the abdomen based on her family history.  HPI, Physical Exam, Assessment and Plan have been scribed under the direction and in the presence of Hervey Ard, MD.  Gaspar Cola, CMA   I have completed the exam and reviewed the above documentation for accuracy and completeness.  I agree with the above.  Haematologist has been used and any errors in dictation or transcription are unintentional.  Hervey Ard, M.D., F.A.C.S.  The patient is scheduled for an ultrasound of the abdomen at [facility] on 10/27/17 at 10:00 am. You  will have  nothing to eat or drink after midnight. You will arrive there by 9:45 am. The patient is aware of date, time, and instructions.  Documented by Caryl-Lyn Otis Brace LPN   Forest Gleason Geovana Gebel 10/27/2017, 5:07 AM

## 2017-10-25 NOTE — Patient Instructions (Addendum)
Patient to have a abdomen ultrasound . The patient is aware to call back for any questions or concerns  The patient is scheduled for an ultrasound of the abdomen at [facility] on 10/27/17 at 10:00 am. You will have nothing to eat or drink after midnight. You will arrive there by 9:45 am.

## 2017-10-27 ENCOUNTER — Ambulatory Visit
Admission: RE | Admit: 2017-10-27 | Discharge: 2017-10-27 | Disposition: A | Discharge status: Home / Self Care | Payer: Medicare Other | Source: Ambulatory Visit | Attending: General Surgery | Admitting: General Surgery

## 2017-10-27 DIAGNOSIS — K760 Fatty (change of) liver, not elsewhere classified: Secondary | ICD-10-CM | POA: Insufficient documentation

## 2017-10-27 DIAGNOSIS — R1013 Epigastric pain: Secondary | ICD-10-CM | POA: Insufficient documentation

## 2017-10-29 ENCOUNTER — Emergency Department: Payer: Medicare Other

## 2017-10-29 ENCOUNTER — Other Ambulatory Visit: Payer: Self-pay

## 2017-10-29 ENCOUNTER — Emergency Department
Admission: EM | Admit: 2017-10-29 | Discharge: 2017-10-29 | Disposition: A | Discharge status: Home / Self Care | Payer: Medicare Other | Attending: Emergency Medicine | Admitting: Emergency Medicine

## 2017-10-29 DIAGNOSIS — Z87891 Personal history of nicotine dependence: Secondary | ICD-10-CM | POA: Diagnosis not present

## 2017-10-29 DIAGNOSIS — I10 Essential (primary) hypertension: Secondary | ICD-10-CM | POA: Insufficient documentation

## 2017-10-29 DIAGNOSIS — Z7982 Long term (current) use of aspirin: Secondary | ICD-10-CM | POA: Diagnosis not present

## 2017-10-29 DIAGNOSIS — E119 Type 2 diabetes mellitus without complications: Secondary | ICD-10-CM | POA: Diagnosis not present

## 2017-10-29 DIAGNOSIS — I208 Other forms of angina pectoris: Secondary | ICD-10-CM

## 2017-10-29 DIAGNOSIS — Z8673 Personal history of transient ischemic attack (TIA), and cerebral infarction without residual deficits: Secondary | ICD-10-CM | POA: Diagnosis not present

## 2017-10-29 DIAGNOSIS — Z7984 Long term (current) use of oral hypoglycemic drugs: Secondary | ICD-10-CM | POA: Insufficient documentation

## 2017-10-29 DIAGNOSIS — Z79899 Other long term (current) drug therapy: Secondary | ICD-10-CM | POA: Insufficient documentation

## 2017-10-29 DIAGNOSIS — R0602 Shortness of breath: Secondary | ICD-10-CM | POA: Diagnosis not present

## 2017-10-29 DIAGNOSIS — J449 Chronic obstructive pulmonary disease, unspecified: Secondary | ICD-10-CM | POA: Diagnosis not present

## 2017-10-29 DIAGNOSIS — Z7902 Long term (current) use of antithrombotics/antiplatelets: Secondary | ICD-10-CM | POA: Diagnosis not present

## 2017-10-29 DIAGNOSIS — G459 Transient cerebral ischemic attack, unspecified: Secondary | ICD-10-CM | POA: Insufficient documentation

## 2017-10-29 DIAGNOSIS — I252 Old myocardial infarction: Secondary | ICD-10-CM | POA: Diagnosis not present

## 2017-10-29 DIAGNOSIS — Z951 Presence of aortocoronary bypass graft: Secondary | ICD-10-CM | POA: Diagnosis not present

## 2017-10-29 DIAGNOSIS — I25118 Atherosclerotic heart disease of native coronary artery with other forms of angina pectoris: Secondary | ICD-10-CM | POA: Insufficient documentation

## 2017-10-29 DIAGNOSIS — R0789 Other chest pain: Secondary | ICD-10-CM | POA: Diagnosis present

## 2017-10-29 LAB — BASIC METABOLIC PANEL
Anion gap: 9 (ref 5–15)
BUN: 12 mg/dL (ref 8–23)
CO2: 25 mmol/L (ref 22–32)
Calcium: 8.5 mg/dL — ABNORMAL LOW (ref 8.9–10.3)
Chloride: 108 mmol/L (ref 98–111)
Creatinine, Ser: 0.71 mg/dL (ref 0.44–1.00)
GFR calc Af Amer: >60 mL/min (ref >60)
GFR calc non Af Amer: >60 mL/min (ref >60)
Glucose, Bld: 104 mg/dL — ABNORMAL HIGH (ref 70–99)
Potassium: 3.8 mmol/L (ref 3.5–5.1)
Sodium: 142 mmol/L (ref 135–145)

## 2017-10-29 LAB — TROPONIN I
Troponin I: <0.03 ng/mL (ref <0.03)
Troponin I: <0.03 ng/mL (ref <0.03)

## 2017-10-29 LAB — CBC
HCT: 32.9 % — ABNORMAL LOW (ref 35.0–47.0)
Hemoglobin: 11.2 g/dL — ABNORMAL LOW (ref 12.0–16.0)
MCH: 31.2 pg (ref 26.0–34.0)
MCHC: 34.1 g/dL (ref 32.0–36.0)
MCV: 91.5 fL (ref 80.0–100.0)
Platelets: 225 10*3/uL (ref 150–440)
RBC: 3.6 MIL/uL — ABNORMAL LOW (ref 3.80–5.20)
RDW: 14 % (ref 11.5–14.5)
WBC: 7.4 10*3/uL (ref 3.6–11.0)

## 2017-10-29 NOTE — Discharge Instructions (Signed)
Please continue taking all of your medications as prescribed and follow up with your cardiologist this coming week for recheck.  Return to the emergency department for any concerns whatsoever.  If you change your mind and would like to be admitted to the hospital for your full evaluation please come back and we are happy to continue your care.  It was a pleasure to take care of you today, and thank you for coming to our emergency department.  If you have any questions or concerns before leaving please ask the nurse to grab me and I'm more than happy to go through your aftercare instructions again.  If you were prescribed any opioid pain medication today such as Norco, Vicodin, Percocet, morphine, hydrocodone, or oxycodone please make sure you do not drive when you are taking this medication as it can alter your ability to drive safely.  If you have any concerns once you are home that you are not improving or are in fact getting worse before you can make it to your follow-up appointment, please do not hesitate to call [phone] and come back for further evaluation.  Darel Hong, MD  Results for orders placed or performed during the hospital encounter of 35/57/32  Basic metabolic panel  Result Value Ref Range   Sodium 142 135 - 145 mmol/L   Potassium 3.8 3.5 - 5.1 mmol/L   Chloride 108 98 - 111 mmol/L   CO2 25 22 - 32 mmol/L   Glucose, Bld 104 (H) 70 - 99 mg/dL   BUN 12 8 - 23 mg/dL   Creatinine, Ser 0.71 0.44 - 1.00 mg/dL   Calcium 8.5 (L) 8.9 - 10.3 mg/dL   GFR calc non Af Amer >60 >60 mL/min   GFR calc Af Amer >60 >60 mL/min   Anion gap 9 5 - 15  CBC  Result Value Ref Range   WBC 7.4 3.6 - 11.0 K/uL   RBC 3.60 (L) 3.80 - 5.20 MIL/uL   Hemoglobin 11.2 (L) 12.0 - 16.0 g/dL   HCT 32.9 (L) 35.0 - 47.0 %   MCV 91.5 80.0 - 100.0 fL   MCH 31.2 26.0 - 34.0 pg   MCHC 34.1 32.0 - 36.0 g/dL   RDW 14.0 11.5 - 14.5 %   Platelets 225 150 - 440 K/uL  Troponin I  Result Value Ref Range   Troponin  I <0.03 <0.03 ng/mL  Troponin I  Result Value Ref Range   Troponin I <0.03 <0.03 ng/mL   Dg Chest 2 View  Result Date: 10/29/2017 CLINICAL DATA:  Left-sided chest pain. EXAM: CHEST - 2 VIEW COMPARISON:  07/26/2017 FINDINGS: The lungs are clear without focal pneumonia, edema, pneumothorax or pleural effusion. The cardiopericardial silhouette is within normal limits for size. Status post CABG. Degenerative changes noted left proximal humerus IMPRESSION: Stable.  No acute findings. Electronically Signed   By: Misty Stanley M.D.   On: 10/29/2017 01:46   Ct Head Wo Contrast  Result Date: 10/29/2017 CLINICAL DATA:  Stroke-like symptoms with slurred speech EXAM: CT HEAD WITHOUT CONTRAST TECHNIQUE: Contiguous axial images were obtained from the base of the skull through the vertex without intravenous contrast. COMPARISON:  05/16/2016 FINDINGS: Brain: Stable areas of chronic lacunar infarction bilaterally similar to those seen on the prior study. No findings to suggest acute hemorrhage, acute infarction or space-occupying mass lesion are seen. Vascular: No hyperdense vessel or unexpected calcification. Skull: Normal. Negative for fracture or focal lesion. Sinuses/Orbits: No acute finding. Other: None. IMPRESSION: Chronic  white matter ischemic change without acute intracranial abnormality. Electronically Signed   By: Inez Catalina M.D.   On: 10/29/2017 07:15   US Abdomen Complete  Result Date: 10/27/2017 CLINICAL DATA:  Epigastric abdominal pain for 6 months EXAM: ABDOMEN ULTRASOUND COMPLETE COMPARISON:  CT chest abdomen pelvis 04/29/2014 FINDINGS: Gallbladder: Normally distended without stones or wall thickening. No pericholecystic fluid or sonographic Murphy sign. Common bile duct: Diameter: 4 mm diameter, normal minimally increased hepatic echogenicity consistent with fatty infiltration as seen on prior CT. No patent mass or nodularity. Liver: No focal lesion identified. Within normal limits in parenchymal  echogenicity. Portal vein patent with normal direction of blood flow towards the liver. IVC: Normal appearance Pancreas: Visualized portion of pancreatic head, body and proximal tail normal appearance with distal tail obscured by bowel gas Spleen: Normal appearance, 6.3 cm length Right Kidney: Length: 11.5 cm. Normal morphology without mass or hydronephrosis. Left Kidney: Length: 10.9 cm. Normal morphology without mass or hydronephrosis. Abdominal aorta: Normal caliber Other findings: No free fluid IMPRESSION: Fatty infiltration of liver. Incomplete pancreatic tail visualization. Otherwise normal exam. Electronically Signed   By: Lavonia Dana M.D.   On: 10/27/2017 15:26

## 2017-10-29 NOTE — ED Notes (Signed)
ED Provider at bedside. 

## 2017-10-29 NOTE — ED Notes (Signed)
Patient transported to X-ray 

## 2017-10-29 NOTE — ED Provider Notes (Signed)
[facility] Emergency Department Provider Note  ____________________________________________   First MD Initiated Contact with Patient 10/29/17 [phone]     (approximate)  I have reviewed the triage vital signs and the nursing notes.   HISTORY  Chief Complaint Chest Pain   HPI Candace [name] is a 67 y.o. female who self presents to the emergency department with several days of intermittent left-sided chest pain.  The pain is throbbing and aching and seems to radiate to her left shoulder her neck and her jaw.  Is associated with some shortness of breath.  It is worse with exertion although completely resolves with rest.  She has a known history of coronary artery disease and has chronic angina that is been difficult to control.  She has begun Imdur which seems to have improved her symptoms but she persists.  She also said that earlier today she had about a 5-minute episode of slurred speech which concerned her for a "mini stroke".  Her symptoms came on suddenly and passed away quickly on their own.  Nothing seemed to make them better or worse.  She denies other neuro symptoms.    Past Medical History:  Diagnosis Date  . Allergy   . Anemia   . Anxiety   . Arrhythmia   . Arthritis   . Coronary artery disease   . Depression   . Diabetes mellitus without complication ([place])   . Dyspnea    doe  . Dysrhythmia   . GERD (gastroesophageal reflux disease)   . Headache   . History of hiatal hernia   . Hyperlipidemia   . Hypertension   . Myocardial infarction ([place])    2016, 04/2017  . Myocardial infarction with cardiac rehabilitation [facility])    MI 2016/ CABG 8/17    FINISHED CARDIAC REHAB 3 WEEKS AGO  . Panic attack   . Pneumonia   . Reflux   . Stroke [facility]) 2015   showed up on MRI; no weakness noted  . TIA (transient ischemic attack)   . Voice tremor     Patient Active Problem List   Diagnosis Date Noted  . Abdominal pain, epigastric 10/27/2017  . Temporal  arteritis ([place]) 08/23/2017  . COPD suggested by initial evaluation ([place]) 08/23/2017  . Acute and chronic respiratory failure with hypoxia ([place]) 08/23/2017  . Postoperative state 08/15/2017  . Incidental pulmonary nodule 08/01/2017  . B12 deficiency 12/01/2016  . Vitamin D deficiency 11/04/2016  . Depression with anxiety 09/30/2016  . PAF (paroxysmal atrial fibrillation) ([place]) 09/30/2016  . Chronic fatigue 09/30/2016  . Resting tremor 09/30/2016  . Non-intractable vomiting with nausea 09/30/2016  . Mild obstructive sleep apnea 09/30/2016  . Headache 08/18/2016  . Abnormal stress test 08/05/2016  . Unstable angina ([name]) 08/03/2016  . Diabetes ([place]) 08/03/2016  . Asthma 11/11/2015  . S/P CABG x 4 11/10/2015  . Coronary artery disease 11/07/2015  . Carotid artery narrowing 02/08/2014  . Chest pain 02/08/2014  . Mixed hyperlipidemia 02/08/2014  . Atypical chest pain 02/07/2014  . Essential (primary) hypertension 02/07/2014  . Awareness of heartbeats 02/07/2014  . Abdominal pain 10/05/2013  . D (diarrhea) 10/05/2013  . Acid reflux 10/05/2013    Past Surgical History:  Procedure Laterality Date  . ABDOMINAL HYSTERECTOMY    . APPENDECTOMY  1975  . ARTERY BIOPSY Right 04/26/2016   Procedure: BIOPSY TEMPORAL ARTERY;  Surgeon: Margaretha Sheffield, MD;  Location: [facility] ORS;  Service: ENT;  Laterality: Right;  . CARDIAC CATHETERIZATION N/A 11/06/2015  Procedure: Left Heart Cath and Coronary Angiography;  Surgeon: Corey Skains, MD;  Location: [place] CV [facility];  Service: Cardiovascular;  Laterality: N/A;  . CESAREAN SECTION    . COLONOSCOPY  2015  . CORONARY ANGIOPLASTY  04/2017   [place]  . CORONARY ARTERY BYPASS GRAFT N/A 11/10/2015   Procedure: CORONARY ARTERY BYPASS GRAFTING (CABG), ON PUMP, TIMES FOUR, USING LEFT INTERNAL MAMMARY ARTERY, BILATERAL GREATER SAPHENOUS VEINS HARVESTED ENDOSCOPICALLY;  Surgeon: [name], MD;  Location: [place];  Service: Open Heart  Surgery;  Laterality: N/A;  LIMA-LAD; SEQ SVG-OM1-OM2; SVG-PL  . CORONARY STENT INTERVENTION N/A 08/05/2016   Procedure: Coronary Stent Intervention;  Surgeon: Isaias Cowman, MD;  Location: [place] CV [facility];  Service: Cardiovascular;  Laterality: N/A;  . HYSTERECTOMY ABDOMINAL WITH SALPINGO-OOPHORECTOMY Bilateral 08/15/2017   Procedure: HYSTERECTOMY ABDOMINAL WITH BILATERAL SALPINGO-OOPHORECTOMY;  Surgeon: Rubie Maid, MD;  Location: [facility] ORS;  Service: Gynecology;  Laterality: Bilateral;  . LEFT HEART CATH AND CORONARY ANGIOGRAPHY N/A 08/05/2016   Procedure: Left Heart Cath and Coronary Angiography;  Surgeon: Isaias Cowman, MD;  Location: [place] [facility];  Service: Cardiovascular;  Laterality: N/A;  . TEE WITHOUT CARDIOVERSION N/A 11/10/2015   Procedure: TRANSESOPHAGEAL ECHOCARDIOGRAM (TEE);  Surgeon: [name], MD;  Location: [place];  Service: Open Heart Surgery;  Laterality: N/A;  . TUBAL LIGATION      Prior to Admission medications   Medication Sig Start Date End Date Taking? Authorizing Provider  acetaminophen (TYLENOL) 500 MG tablet Take 2 tablets (1,000 mg total) by mouth every 8 (eight) hours as needed. 08/18/17  Yes Rubie Maid, MD  albuterol (PROVENTIL HFA;VENTOLIN HFA) 108 (90 Base) MCG/ACT inhaler Inhale 2 puffs into the lungs every 6 (six) hours as needed for wheezing or shortness of breath. 08/01/17  Yes Rigoberto Noel, MD  aspirin 81 MG chewable tablet Chew 1 tablet (81 mg total) by mouth daily. 10/11/16  Yes [name]., MD  clopidogrel (PLAVIX) 75 MG tablet Take 75 mg by mouth daily.   Yes [provider]  Coenzyme Q10 (CO Q 10) 100 MG CAPS Take 100 mg by mouth daily. 04/25/17  Yes [name]., MD  cyproheptadine (PERIACTIN) 4 MG tablet Take 4 mg by mouth 3 (three) times daily as needed for allergies.   Yes [provider]  isosorbide mononitrate (IMDUR) 30 MG 24 hr tablet Take 2 tablets (60 mg total) by mouth  daily. Patient taking differently: Take 30 mg by mouth 2 (two) times daily.  11/04/16  Yes Plonk, Gwyndolyn Saxon, MD  loperamide (IMODIUM) 2 MG capsule Take by mouth as needed for diarrhea or loose stools.   Yes [provider]  metFORMIN (GLUCOPHAGE) 1000 MG tablet Take 1 tablet (1,000 mg total) by mouth 2 (two) times daily with a meal. 11/04/16  Yes Plonk, William, MD  metoprolol tartrate (LOPRESSOR) 50 MG tablet TAKE 1 TABLET BY MOUTH TWICE A DAY FOR HIGH BLOOD PRESSURE 02/24/17  Yes [name]., MD  nitroGLYCERIN (NITROSTAT) 0.4 MG SL tablet Place 1 tablet (0.4 mg total) under the tongue every 5 (five) minutes as needed for chest pain. 07/27/16  Yes Mody, Ulice Bold, MD  omeprazole (PRILOSEC) 20 MG capsule Take 1 capsule (20 mg total) by mouth daily. 09/22/17  Yes [name]., MD  ondansetron (ZOFRAN-ODT) 8 MG disintegrating tablet DISSOLVE 1 TABLET ON THE TONGUE EVERY 8 HOURS AS NEEDED FOR NAUSEA OR VOMITING 05/03/17  Yes Eulas Post  Brooke Bonito., MD  rosuvastatin (CRESTOR) 10 MG tablet Take 1 tablet (10 mg total) by mouth daily. 03/24/17  Yes [name]., MD  sucralfate (CARAFATE) 1 g tablet Take 1 tablet (1 g total) by mouth 2 (two) times daily. 10/06/17  Yes [name]., MD  tiZANidine (ZANAFLEX) 2 MG tablet Take 2 mg by mouth at bedtime.    Yes [provider]    Allergies Lisinopril and Penicillins  Family History  Problem Relation Age of Onset  . Cancer Father   . Hypertension Father   . Heart disease Father   . Asthma Father   . Cancer Mother   . Hypertension Mother   . Pancreatic cancer Mother 63  . Cancer Sister   . Breast cancer Sister 10  . Breast cancer Sister 44  . Lung cancer Brother   . Pancreatic cancer Sister 42  . Cancer Sister     Social History Social History   Tobacco Use  . Smoking status: Former Smoker    Packs/day: 0.50    Types: Cigarettes    Last attempt to quit: 10/07/2001    Years since quitting: 16.0  .  Smokeless tobacco: Never Used  Substance Use Topics  . Alcohol use: No    Alcohol/week: 0.0 oz  . Drug use: No    Review of Systems Constitutional: No fever/chills Eyes: No visual changes. ENT: No sore throat. Cardiovascular: Positive for chest pain. Respiratory: Positive for shortness of breath. Gastrointestinal: No abdominal pain.  No nausea, no vomiting.  No diarrhea.  No constipation. Genitourinary: Negative for dysuria. Musculoskeletal: Negative for back pain. Skin: Negative for rash. Neurological: Negative for headache   ____________________________________________   PHYSICAL EXAM:  VITAL SIGNS: ED Triage Vitals  Enc Vitals Group     BP 10/29/17 0114 (!) 170/95     Pulse Rate 10/29/17 0112 85     Resp 10/29/17 0112 14     Temp 10/29/17 0112 98.1 F (36.7 C)     Temp Source 10/29/17 0112 Oral     SpO2 10/29/17 0112 97 %     Weight 10/29/17 0110 167 lb (75.8 kg)     Height 10/29/17 0110 5\' 2"  (1.575 m)     Head Circumference --      Peak Flow --      Pain Score 10/29/17 0110 9     Pain Loc --      Pain Edu? --      Excl. in [place]? --     Constitutional: Alert and oriented x4 pleasant cooperative speaks in full clear sentences Eyes: PERRL EOMI. Head: Atraumatic. Nose: No congestion/rhinnorhea. Mouth/Throat: No trismus Neck: No stridor.   Cardiovascular: Normal rate, regular rhythm. Grossly normal heart sounds.  Good peripheral circulation. Respiratory: Normal respiratory effort.  No retractions. Lungs CTAB and moving good air Gastrointestinal: Soft nontender Musculoskeletal: No lower extremity edema   Neurologic:  Normal speech and language. No gross focal neurologic deficits are appreciated. Skin:  Skin is warm, dry and intact. No rash noted. Psychiatric: Mood and affect are normal. Speech and behavior are normal.    ____________________________________________   DIFFERENTIAL includes but not limited to  Stroke, TIA, stable angina, unstable angina,  non-STEMI, STEMI, aortic dissection ____________________________________________   LABS (all labs ordered are listed, but only abnormal results are displayed)  Labs Reviewed  BASIC METABOLIC PANEL - Abnormal; Notable for the following components:      Result Value   Glucose, Bld 104 (*)  Calcium 8.5 (*)    All other components within normal limits  CBC - Abnormal; Notable for the following components:   RBC 3.60 (*)    Hemoglobin 11.2 (*)    HCT 32.9 (*)    All other components within normal limits  TROPONIN I  TROPONIN I    Lab work reviewed by me with no acute ischemia noted x2 __________________________________________  EKG  ED ECG REPORT I, Darel Hong, the attending physician, personally viewed and interpreted this ECG.  Date: 11/01/2017 EKG Time:  Rate: 85 Rhythm: normal sinus rhythm QRS Axis: normal Intervals: normal ST/T Wave abnormalities: normal Narrative Interpretation: no evidence of acute ischemia  ____________________________________________  RADIOLOGY  Chest x-ray reviewed by me with no acute disease Head CT reviewed by me with no acute disease ____________________________________________   PROCEDURES  Procedure(s) performed: no  Procedures  Critical Care performed: no  ____________________________________________   INITIAL IMPRESSION / ASSESSMENT AND PLAN / ED COURSE  Pertinent labs & imaging results that were available during my care of the patient were reviewed by me and considered in my medical decision making (see chart for details).   As part of my medical decision making, I reviewed the following data within the [place] History obtained from family if available, nursing notes, old chart and ekg, as well as notes from prior ED visits.  The patient seems to have 2 issues going on today.  Regarding her chest pain it is unchanged from baseline and she has chronic stable angina that is being managed by her  cardiologist and is been difficult to control.  I obtained 2 troponins and she has no signs of acute ischemia.  Kept on monitor multiple hours with no ectopy.  EKG is nonischemic.  The patient is understandably frustrated with her persistent symptoms however I do not believe she requires inpatient admission because she has no evidence of unstable angina.  Regarding her neuro symptoms she likely did have a TIA.  Head CT is negative.  She is already taking both aspirin and Plavix.  Together we discussed the importance of an MRI and imaging of her carotid arteries and I offered her inpatient admission today to complete her TIA work-up however she would prefer to have this work-up done as an outpatient which I think is entirely reasonable.  Strict return precautions and given to the patient verbalizes understanding agreement with the plan.      ____________________________________________   FINAL CLINICAL IMPRESSION(S) / ED DIAGNOSES  Final diagnoses:  Stable angina ([place])  TIA (transient ischemic attack)      NEW MEDICATIONS STARTED DURING THIS VISIT:  Discharge Medication List as of 10/29/2017  7:46 AM       Note:  This document was prepared using Dragon voice recognition software and may include unintentional dictation errors.     Darel Hong, MD 11/01/17 [phone]

## 2017-10-29 NOTE — ED Notes (Signed)
Patient transported to CT 

## 2017-10-29 NOTE — ED Triage Notes (Signed)
Pt states central to left sided chest pain since Wednesday. Pt states today pain has worsened to left shoulder, neck and jaw. Pt also complains of lightheadedness and shob. Pt appears in no acute distress in triage. Pt states she took 324mg  asa and two ntg without pain relief today.

## 2017-10-29 NOTE — ED Notes (Signed)
Patient c/o medial chest pain radiating to back X 3 days. Patient describes pain as tightness. Patient c/o accompanying symptoms of SOB, lightheadedness, N/V and weakness. Patient reports 3 emeses.   Patient also reports 1 TIA last night. Patient reports she began to have slurred speech and facial tingling.

## 2017-11-02 ENCOUNTER — Ambulatory Visit: Payer: Medicare Other

## 2017-11-03 ENCOUNTER — Other Ambulatory Visit: Payer: Self-pay | Admitting: Family Medicine

## 2017-11-03 DIAGNOSIS — G459 Transient cerebral ischemic attack, unspecified: Secondary | ICD-10-CM | POA: Insufficient documentation

## 2017-11-03 DIAGNOSIS — I5032 Chronic diastolic (congestive) heart failure: Secondary | ICD-10-CM | POA: Insufficient documentation

## 2017-11-04 ENCOUNTER — Other Ambulatory Visit: Payer: Self-pay | Admitting: Family Medicine

## 2017-11-09 ENCOUNTER — Ambulatory Visit (INDEPENDENT_AMBULATORY_CARE_PROVIDER_SITE_OTHER): Payer: Medicare Other | Admitting: Family Medicine

## 2017-11-09 ENCOUNTER — Ambulatory Visit
Admission: RE | Admit: 2017-11-09 | Discharge: 2017-11-09 | Disposition: A | Discharge status: Home / Self Care | Payer: Medicare Other | Source: Ambulatory Visit | Attending: Pulmonary Disease | Admitting: Pulmonary Disease

## 2017-11-09 ENCOUNTER — Encounter: Payer: Self-pay | Admitting: Family Medicine

## 2017-11-09 VITALS — BP 122/72 | HR 82 | Temp 98.6°F | Resp 16 | Ht 62.0 in | Wt 169.0 lb

## 2017-11-09 DIAGNOSIS — R110 Nausea: Secondary | ICD-10-CM

## 2017-11-09 DIAGNOSIS — R1010 Upper abdominal pain, unspecified: Secondary | ICD-10-CM

## 2017-11-09 DIAGNOSIS — I700 Atherosclerosis of aorta: Secondary | ICD-10-CM | POA: Insufficient documentation

## 2017-11-09 DIAGNOSIS — I6529 Occlusion and stenosis of unspecified carotid artery: Secondary | ICD-10-CM

## 2017-11-09 DIAGNOSIS — R911 Solitary pulmonary nodule: Secondary | ICD-10-CM

## 2017-11-09 DIAGNOSIS — I10 Essential (primary) hypertension: Secondary | ICD-10-CM | POA: Diagnosis not present

## 2017-11-09 DIAGNOSIS — J432 Centrilobular emphysema: Secondary | ICD-10-CM | POA: Diagnosis not present

## 2017-11-09 NOTE — Progress Notes (Signed)
Patient: Candace [name] Female    DOB: Feb 20, 1951   67 y.o.   MRN: [id number] Visit Date: 11/09/2017  Today's Provider: [name], MD   Chief Complaint  Patient presents with  . Follow-up   Subjective:    HPI  Patient comes in today for a follow up she was last seen in the office 1 month ago.  She was started on sucralfate 1g to help with nausea and abdominal pain. Patient reports that this has resolved.  Since last visit she was seen by neurology and cardiology. She has also been seen in the ER. Per cardiology, patient is now taking furosemide 20mg . She is tolerating the medication well. Per neurology, she was started on tizanidine 2mg  and reports that she is tolerating this medication well.   Patient is also wanting to get medical clearance to start driving again. She reports that her neurologist has cleared her and would like to discuss this matter with [place].       Allergies  Allergen Reactions  . Lisinopril     cough  . Penicillins Swelling, Rash and Other (See Comments)    Has patient had a PCN reaction causing immediate rash, facial/tongue/throat swelling, SOB or lightheadedness with hypotension: Yes Has patient had a PCN reaction causing severe rash involving mucus membranes or skin necrosis: Yes Has patient had a PCN reaction that required hospitalization No Has patient had a PCN reaction occurring within the last 10 years: Yes If all of the above answers are "NO", then may proceed with Cephalosporin use.      Current Outpatient Medications:  .  acetaminophen (TYLENOL) 500 MG tablet, Take 2 tablets (1,000 mg total) by mouth every 8 (eight) hours as needed., Disp: 60 tablet, Rfl: 1 .  albuterol (PROVENTIL HFA;VENTOLIN HFA) 108 (90 Base) MCG/ACT inhaler, Inhale 2 puffs into the lungs every 6 (six) hours as needed for wheezing or shortness of breath., Disp: 1 Inhaler, Rfl: 2 .  clopidogrel (PLAVIX) 75 MG tablet, Take 75 mg by mouth daily., Disp: , Rfl:    .  Coenzyme Q10 (CO Q 10) 100 MG CAPS, Take 100 mg by mouth daily., Disp: 30 capsule, Rfl: 12 .  [employer] ASPIRIN ADULT LOW DOSE 81 MG chewable tablet, CHEW 1 TABLET (81 MG TOTAL) BY MOUTH DAILY., Disp: 90 tablet, Rfl: 3 .  cyproheptadine (PERIACTIN) 4 MG tablet, Take 4 mg by mouth 3 (three) times daily as needed for allergies., Disp: , Rfl:  .  furosemide (LASIX) 20 MG tablet, Take 20 mg by mouth daily., Disp: , Rfl:  .  isosorbide mononitrate (IMDUR) 30 MG 24 hr tablet, Take 2 tablets (60 mg total) by mouth daily. (Patient taking differently: Take 30 mg by mouth 2 (two) times daily. ), Disp: 60 tablet, Rfl: 12 .  loperamide (IMODIUM) 2 MG capsule, Take by mouth as needed for diarrhea or loose stools., Disp: , Rfl:  .  metFORMIN (GLUCOPHAGE) 1000 MG tablet, Take 1 tablet (1,000 mg total) by mouth 2 (two) times daily with a meal., Disp: 180 tablet, Rfl: 3 .  metoprolol tartrate (LOPRESSOR) 50 MG tablet, TAKE 1 TABLET BY MOUTH TWICE A DAY FOR HIGH BLOOD PRESSURE, Disp: 60 tablet, Rfl: 12 .  nitroGLYCERIN (NITROSTAT) 0.4 MG SL tablet, Place 1 tablet (0.4 mg total) under the tongue every 5 (five) minutes as needed for chest pain., Disp: 30 tablet, Rfl: 0 .  omeprazole (PRILOSEC) 20 MG capsule, Take 1 capsule (20 mg total)  by mouth daily., Disp: 30 capsule, Rfl: 3 .  ondansetron (ZOFRAN-ODT) 8 MG disintegrating tablet, DISSOLVE 1 TABLET ON THE TONGUE EVERY 8 HOURS AS NEEDED FOR NAUSEA OR VOMITING, Disp: 30 tablet, Rfl: 1 .  rosuvastatin (CRESTOR) 10 MG tablet, Take 1 tablet (10 mg total) by mouth daily., Disp: 90 tablet, Rfl: 3 .  sucralfate (CARAFATE) 1 g tablet, Take 1 tablet (1 g total) by mouth 2 (two) times daily., Disp: 60 tablet, Rfl: 5 .  tiZANidine (ZANAFLEX) 2 MG tablet, Take 2 mg by mouth at bedtime. , Disp: , Rfl:   Review of Systems  Constitutional: Negative for activity change, appetite change, chills, diaphoresis, fatigue, fever and unexpected weight change.  Respiratory: Negative for cough  and shortness of breath.   Cardiovascular: Negative for chest pain, palpitations and leg swelling.  Musculoskeletal: Negative.   Neurological: Negative for dizziness, weakness, light-headedness and headaches.  Psychiatric/Behavioral: Negative.     Social History   Tobacco Use  . Smoking status: Former Smoker    Packs/day: 0.50    Types: Cigarettes    Last attempt to quit: 10/07/2001    Years since quitting: 16.1  . Smokeless tobacco: Never Used  Substance Use Topics  . Alcohol use: No    Alcohol/week: 0.0 standard drinks   Objective:   BP 122/72 (BP Location: Right Arm, Patient Position: Sitting, Cuff Size: Normal)   Pulse 82   Temp 98.6 F (37 C)   Resp 16   Ht 5\' 2"  (1.575 m)   Wt 169 lb (76.7 kg)   SpO2 99%   BMI 30.91 kg/m  Vitals:   11/09/17 1120  BP: 122/72  Pulse: 82  Resp: 16  Temp: 98.6 F (37 C)  SpO2: 99%  Weight: 169 lb (76.7 kg)  Height: 5\' 2"  (1.575 m)     Physical Exam  Constitutional: She is oriented to person, place, and time. She appears well-developed and well-nourished.  HENT:  Head: Normocephalic and atraumatic.  Right Ear: External ear normal.  Left Ear: External ear normal.  Nose: Nose normal.  Eyes: Conjunctivae are normal. No scleral icterus.  Neck: No thyromegaly present.  Cardiovascular: Normal rate, regular rhythm and normal heart sounds.  Pulmonary/Chest: Effort normal and breath sounds normal.  Abdominal: Soft.  Musculoskeletal: She exhibits no edema.  Neurological: She is alert and oriented to person, place, and time.  Skin: Skin is warm and dry.  Psychiatric: She has a normal mood and affect. Her behavior is normal. Thought content normal.        Assessment & Plan:     CAD/s/p CABG All risk factors treated. CHF Stable--cut lasix to every other day. Abd Pain/Nausea Improved. MDD/GAD Major issue--better today. RTC 1 month.     I have done the exam and reviewed the above chart and it is accurate to the best of my  knowledge. Development worker, community has been used in this note in any air is in the dictation or transcription are unintentional.   [name], MD  [place]

## 2017-11-09 NOTE — Patient Instructions (Signed)
Start furosemide 20mg  every other day.

## 2017-11-10 ENCOUNTER — Telehealth: Payer: Self-pay

## 2017-11-10 ENCOUNTER — Other Ambulatory Visit: Payer: Self-pay

## 2017-11-10 DIAGNOSIS — R1013 Epigastric pain: Secondary | ICD-10-CM

## 2017-11-10 DIAGNOSIS — R1011 Right upper quadrant pain: Secondary | ICD-10-CM

## 2017-11-10 DIAGNOSIS — R112 Nausea with vomiting, unspecified: Secondary | ICD-10-CM

## 2017-11-10 LAB — COMPREHENSIVE METABOLIC PANEL
ALT: 10 IU/L (ref 0–32)
AST: 17 IU/L (ref 0–40)
Albumin/Globulin Ratio: 1.6 (ref 1.2–2.2)
Albumin: 4.4 g/dL (ref 3.6–4.8)
Alkaline Phosphatase: 106 IU/L (ref 39–117)
BUN/Creatinine Ratio: 17 (ref 12–28)
BUN: 14 mg/dL (ref 8–27)
Bilirubin Total: 0.2 mg/dL (ref 0.0–1.2)
CO2: 23 mmol/L (ref 20–29)
Calcium: 10 mg/dL (ref 8.7–10.3)
Chloride: 101 mmol/L (ref 96–106)
Creatinine, Ser: 0.83 mg/dL (ref 0.57–1.00)
GFR calc Af Amer: 85 mL/min/{1.73_m2} (ref >59)
GFR calc non Af Amer: 74 mL/min/{1.73_m2} (ref >59)
Globulin, Total: 2.8 g/dL (ref 1.5–4.5)
Glucose: 123 mg/dL — ABNORMAL HIGH (ref 65–99)
Potassium: 4.1 mmol/L (ref 3.5–5.2)
Sodium: 143 mmol/L (ref 134–144)
Total Protein: 7.2 g/dL (ref 6.0–8.5)

## 2017-11-10 NOTE — Telephone Encounter (Signed)
-----   Message from Robert Bellow, MD sent at 10/28/2017  5:35 PM EDT ----- Please arrange for a CT of the abdomen for epigastric pain, family history of pancreatic cancer.  Thank you ----- Message ----- From: Interface, Rad Results In Sent: 10/27/2017   3:28 PM To: Robert Bellow, MD

## 2017-11-10 NOTE — Telephone Encounter (Signed)
The patient is scheduled for a CT abdomen with contrast at [facility] on 11/24/17 at 8:00 am. She will arrive by 7:45 am and have nothing to eat or drink for 4 hours prior. She will pick up a prep kit at least two days prior. The patient is aware of date, time, and instructions.

## 2017-11-11 ENCOUNTER — Telehealth: Payer: Self-pay | Admitting: Pulmonary Disease

## 2017-11-11 NOTE — Telephone Encounter (Signed)
Called and spoke with patient regarding results.  Informed the patient of results and recommendations today. Pt verbalized understanding and denied any questions or concerns at this time.  Nothing further needed.  

## 2017-11-11 NOTE — Progress Notes (Signed)
LMTCB

## 2017-11-16 ENCOUNTER — Ambulatory Visit: Payer: Medicaid Other | Admitting: Family Medicine

## 2017-11-16 ENCOUNTER — Encounter: Payer: Medicare Other | Admitting: Family Medicine

## 2017-11-16 ENCOUNTER — Ambulatory Visit: Payer: Medicare Other

## 2017-11-22 ENCOUNTER — Telehealth: Payer: Self-pay

## 2017-11-22 NOTE — Telephone Encounter (Signed)
Pt returned call and is scheduled for AWV with [facility] @ 120pm & CPE/Follow-up with Dr. Rosanna Randy 2 pm. Thanks TNP

## 2017-11-22 NOTE — Telephone Encounter (Signed)
LMTCB and schedule AWV. Pt cancelled previous apt on 11/16/17. -MM

## 2017-11-23 NOTE — Telephone Encounter (Signed)
Noted, thank you.  -MM 

## 2017-11-24 ENCOUNTER — Ambulatory Visit
Admission: RE | Admit: 2017-11-24 | Discharge: 2017-11-24 | Disposition: A | Discharge status: Home / Self Care | Payer: Medicare Other | Source: Ambulatory Visit | Attending: General Surgery | Admitting: General Surgery

## 2017-11-24 ENCOUNTER — Encounter: Payer: Self-pay | Admitting: *Deleted

## 2017-11-24 ENCOUNTER — Encounter: Payer: Medicare Other | Attending: Internal Medicine | Admitting: *Deleted

## 2017-11-24 VITALS — Ht 62.0 in | Wt 169.3 lb

## 2017-11-24 DIAGNOSIS — R112 Nausea with vomiting, unspecified: Secondary | ICD-10-CM | POA: Diagnosis not present

## 2017-11-24 DIAGNOSIS — Z87891 Personal history of nicotine dependence: Secondary | ICD-10-CM | POA: Diagnosis not present

## 2017-11-24 DIAGNOSIS — I25118 Atherosclerotic heart disease of native coronary artery with other forms of angina pectoris: Secondary | ICD-10-CM | POA: Insufficient documentation

## 2017-11-24 DIAGNOSIS — E785 Hyperlipidemia, unspecified: Secondary | ICD-10-CM | POA: Diagnosis not present

## 2017-11-24 DIAGNOSIS — I208 Other forms of angina pectoris: Secondary | ICD-10-CM

## 2017-11-24 DIAGNOSIS — R1013 Epigastric pain: Secondary | ICD-10-CM | POA: Diagnosis not present

## 2017-11-24 DIAGNOSIS — F329 Major depressive disorder, single episode, unspecified: Secondary | ICD-10-CM | POA: Insufficient documentation

## 2017-11-24 DIAGNOSIS — R1011 Right upper quadrant pain: Secondary | ICD-10-CM | POA: Diagnosis present

## 2017-11-24 DIAGNOSIS — Z7902 Long term (current) use of antithrombotics/antiplatelets: Secondary | ICD-10-CM | POA: Diagnosis not present

## 2017-11-24 DIAGNOSIS — I252 Old myocardial infarction: Secondary | ICD-10-CM | POA: Diagnosis not present

## 2017-11-24 DIAGNOSIS — K219 Gastro-esophageal reflux disease without esophagitis: Secondary | ICD-10-CM | POA: Insufficient documentation

## 2017-11-24 DIAGNOSIS — Z8673 Personal history of transient ischemic attack (TIA), and cerebral infarction without residual deficits: Secondary | ICD-10-CM | POA: Diagnosis not present

## 2017-11-24 DIAGNOSIS — K449 Diaphragmatic hernia without obstruction or gangrene: Secondary | ICD-10-CM | POA: Insufficient documentation

## 2017-11-24 DIAGNOSIS — I10 Essential (primary) hypertension: Secondary | ICD-10-CM | POA: Insufficient documentation

## 2017-11-24 DIAGNOSIS — Z79899 Other long term (current) drug therapy: Secondary | ICD-10-CM | POA: Diagnosis not present

## 2017-11-24 DIAGNOSIS — F419 Anxiety disorder, unspecified: Secondary | ICD-10-CM | POA: Insufficient documentation

## 2017-11-24 DIAGNOSIS — E119 Type 2 diabetes mellitus without complications: Secondary | ICD-10-CM | POA: Insufficient documentation

## 2017-11-24 DIAGNOSIS — Z7984 Long term (current) use of oral hypoglycemic drugs: Secondary | ICD-10-CM | POA: Diagnosis not present

## 2017-11-24 DIAGNOSIS — D649 Anemia, unspecified: Secondary | ICD-10-CM | POA: Insufficient documentation

## 2017-11-24 DIAGNOSIS — I700 Atherosclerosis of aorta: Secondary | ICD-10-CM | POA: Insufficient documentation

## 2017-11-24 MED ORDER — IOPAMIDOL (ISOVUE-300) INJECTION 61%
100.0000 mL | Freq: Once | INTRAVENOUS | Status: AC | PRN
  Administered 2017-11-24: 100 mL via INTRAVENOUS

## 2017-11-24 NOTE — Patient Instructions (Signed)
Patient Instructions  Patient Details  Name: Candace [name] MRN: [id number] Date of Birth: 14-Oct-1950 Referring Provider:  Corey Skains, MD  Below are your personal goals for exercise, nutrition, and risk factors. Our goal is to help you stay on track towards obtaining and maintaining these goals. We will be discussing your progress on these goals with you throughout the program.  Initial Exercise Prescription: Initial Exercise Prescription - 11/24/17 1500      Date of Initial Exercise RX and Referring Provider   Date  11/24/17    Referring Provider  [name]      Treadmill   MPH  2.1    Grade  1    Minutes  15    METs  2.91      NuStep   Level  2    SPM  80    Minutes  15    METs  2.9      REL-XR   Level  2    Speed  50    Minutes  15    METs  2.9      Prescription Details   Frequency (times per week)  2    Duration  Progress to 30 minutes of continuous aerobic without signs/symptoms of physical distress      Intensity   THRR 40-80% of Max Heartrate  121-143    Ratings of Perceived Exertion  11-13    Perceived Dyspnea  0-4      Progression   Progression  Continue to progress workloads to maintain intensity without signs/symptoms of physical distress.      Resistance Training   Training Prescription  Yes    Weight  3 lbs    Reps  10-15       Exercise Goals: Frequency: Be able to perform aerobic exercise two to three times per week in program working toward 2-5 days per week of home exercise.  Intensity: Work with a perceived exertion of 11 (fairly light) - 15 (hard) while following your exercise prescription.  We will make changes to your prescription with you as you progress through the program.   Duration: Be able to do 30 to 45 minutes of continuous aerobic exercise in addition to a 5 minute warm-up and a 5 minute cool-down routine.   Nutrition Goals: Your personal nutrition goals will be established when you do your nutrition analysis  with the dietician.  The following are general nutrition guidelines to follow: Cholesterol < 200mg /day Sodium < 1500mg /day Fiber: Women over 50 yrs - 21 grams per day  Personal Goals: Personal Goals and Risk Factors at Admission - 11/24/17 1539      Core Components/Risk Factors/Patient Goals on Admission    Weight Management  Yes;Obesity;Weight Loss    Intervention  Weight Management: Develop a combined nutrition and exercise program designed to reach desired caloric intake, while maintaining appropriate intake of nutrient and fiber, sodium and fats, and appropriate energy expenditure required for the weight goal.;Weight Management: Provide education and appropriate resources to help participant work on and attain dietary goals.;Weight Management/Obesity: Establish reasonable short term and long term weight goals.;Obesity: Provide education and appropriate resources to help participant work on and attain dietary goals.    Admit Weight  169 lb 4.8 oz (76.8 kg)    Goal Weight: Short Term  159 lb (72.1 kg)    Goal Weight: Long Term  145 lb (65.8 kg)    Expected Outcomes  Short Term: Continue to assess and modify  interventions until short term weight is achieved;Long Term: Adherence to nutrition and physical activity/exercise program aimed toward attainment of established weight goal;Weight Loss: Understanding of general recommendations for a balanced deficit meal plan, which promotes 1-2 lb weight loss per week and includes a negative energy balance of [phone] kcal/d;Understanding recommendations for meals to include 15-35% energy as protein, 25-35% energy from fat, 35-60% energy from carbohydrates, less than 200mg  of dietary cholesterol, 20-35 gm of total fiber daily;Understanding of distribution of calorie intake throughout the day with the consumption of 4-5 meals/snacks    Diabetes  Yes    Intervention  Provide education about signs/symptoms and action to take for hypo/hyperglycemia.;Provide  education about proper nutrition, including hydration, and aerobic/resistive exercise prescription along with prescribed medications to achieve blood glucose in normal ranges: Fasting glucose 65-99 mg/dL    Expected Outcomes  Short Term: Participant verbalizes understanding of the signs/symptoms and immediate care of hyper/hypoglycemia, proper foot care and importance of medication, aerobic/resistive exercise and nutrition plan for blood glucose control.;Long Term: Attainment of HbA1C < 7%.    Heart Failure  Yes    Intervention  Provide a combined exercise and nutrition program that is supplemented with education, support and counseling about heart failure. Directed toward relieving symptoms such as shortness of breath, decreased exercise tolerance, and extremity edema.    Expected Outcomes  Improve functional capacity of life;Short term: Attendance in program 2-3 days a week with increased exercise capacity. Reported lower sodium intake. Reported increased fruit and vegetable intake. Reports medication compliance.;Short term: Daily weights obtained and reported for increase. Utilizing diuretic protocols set by physician.;Long term: Adoption of self-care skills and reduction of barriers for early signs and symptoms recognition and intervention leading to self-care maintenance.    Hypertension  Yes    Intervention  Provide education on lifestyle modifcations including regular physical activity/exercise, weight management, moderate sodium restriction and increased consumption of fresh fruit, vegetables, and low fat dairy, alcohol moderation, and smoking cessation.;Monitor prescription use compliance.    Expected Outcomes  Short Term: Continued assessment and intervention until BP is < 140/27mm HG in hypertensive participants. < 130/58mm HG in hypertensive participants with diabetes, heart failure or chronic kidney disease.;Long Term: Maintenance of blood pressure at goal levels.    Lipids  Yes    Intervention   Provide education and support for participant on nutrition & aerobic/resistive exercise along with prescribed medications to achieve LDL 70mg , HDL >40mg .    Expected Outcomes  Short Term: Participant states understanding of desired cholesterol values and is compliant with medications prescribed. Participant is following exercise prescription and nutrition guidelines.;Long Term: Cholesterol controlled with medications as prescribed, with individualized exercise RX and with personalized nutrition plan. Value goals: LDL < 70mg , HDL > 40 mg.       Tobacco Use Initial Evaluation: Social History   Tobacco Use  Smoking Status Former Smoker  . Packs/day: 0.50  . Types: Cigarettes  . Last attempt to quit: 10/07/2001  . Years since quitting: 16.1  Smokeless Tobacco Never Used    Exercise Goals and Review: Exercise Goals    Row Name 11/24/17 1530             Exercise Goals   Increase Physical Activity  Yes       Intervention  Provide advice, education, support and counseling about physical activity/exercise needs.;Develop an individualized exercise prescription for aerobic and resistive training based on initial evaluation findings, risk stratification, comorbidities and participant's personal goals.  Expected Outcomes  Short Term: Attend rehab on a regular basis to increase amount of physical activity.;Long Term: Add in home exercise to make exercise part of routine and to increase amount of physical activity.;Long Term: Exercising regularly at least 3-5 days a week.       Increase Strength and Stamina  Yes       Intervention  Provide advice, education, support and counseling about physical activity/exercise needs.;Develop an individualized exercise prescription for aerobic and resistive training based on initial evaluation findings, risk stratification, comorbidities and participant's personal goals.       Expected Outcomes  Short Term: Increase workloads from initial exercise  prescription for resistance, speed, and METs.;Short Term: Perform resistance training exercises routinely during rehab and add in resistance training at home;Long Term: Improve cardiorespiratory fitness, muscular endurance and strength as measured by increased METs and functional capacity (6MWT)       Able to understand and use rate of perceived exertion (RPE) scale  Yes       Intervention  Provide education and explanation on how to use RPE scale       Expected Outcomes  Short Term: Able to use RPE daily in rehab to express subjective intensity level;Long Term:  Able to use RPE to guide intensity level when exercising independently       Able to understand and use Dyspnea scale  Yes       Intervention  Provide education and explanation on how to use Dyspnea scale       Expected Outcomes  Short Term: Able to use Dyspnea scale daily in rehab to express subjective sense of shortness of breath during exertion;Long Term: Able to use Dyspnea scale to guide intensity level when exercising independently       Knowledge and understanding of Target Heart Rate Range (THRR)  Yes       Intervention  Provide education and explanation of THRR including how the numbers were predicted and where they are located for reference       Expected Outcomes  Short Term: Able to use daily as guideline for intensity in rehab;Short Term: Able to state/look up THRR;Long Term: Able to use THRR to govern intensity when exercising independently       Able to check pulse independently  Yes       Intervention  Provide education and demonstration on how to check pulse in carotid and radial arteries.;Review the importance of being able to check your own pulse for safety during independent exercise       Expected Outcomes  Short Term: Able to explain why pulse checking is important during independent exercise;Long Term: Able to check pulse independently and accurately       Understanding of Exercise Prescription  Yes       Intervention   Provide education, explanation, and written materials on patient's individual exercise prescription       Expected Outcomes  Short Term: Able to explain program exercise prescription;Long Term: Able to explain home exercise prescription to exercise independently          Copy of goals given to participant.

## 2017-11-24 NOTE — Progress Notes (Signed)
Cardiac Individual Treatment Plan  Patient Details  Name: Candace [name] MRN: [id number] Date of Birth: [birth year] Referring Provider:     Cardiac Rehab from 11/24/2017 in [facility] Cardiac and Pulmonary Rehab  Referring Provider  [name]      Initial Encounter Date:    Cardiac Rehab from 11/24/2017 in [facility] Cardiac and Pulmonary Rehab  Date  11/24/17      Visit Diagnosis: Chronic stable angina ([place])  Patient's Home Medications on Admission:  Current Outpatient Medications:  .  acetaminophen (TYLENOL) 500 MG tablet, Take 2 tablets (1,000 mg total) by mouth every 8 (eight) hours as needed., Disp: 60 tablet, Rfl: 1 .  albuterol (PROVENTIL HFA;VENTOLIN HFA) 108 (90 Base) MCG/ACT inhaler, Inhale 2 puffs into the lungs every 6 (six) hours as needed for wheezing or shortness of breath. (Patient not taking: Reported on 11/24/2017), Disp: 1 Inhaler, Rfl: 2 .  clopidogrel (PLAVIX) 75 MG tablet, Take 75 mg by mouth daily., Disp: , Rfl:  .  Coenzyme Q10 (CO Q 10) 100 MG CAPS, Take 100 mg by mouth daily. (Patient not taking: Reported on 11/24/2017), Disp: 30 capsule, Rfl: 12 .  [employer] ASPIRIN ADULT LOW DOSE 81 MG chewable tablet, CHEW 1 TABLET (81 MG TOTAL) BY MOUTH DAILY. (Patient not taking: Reported on 11/24/2017), Disp: 90 tablet, Rfl: 3 .  cyproheptadine (PERIACTIN) 4 MG tablet, Take 4 mg by mouth 3 (three) times daily as needed for allergies., Disp: , Rfl:  .  furosemide (LASIX) 20 MG tablet, Take 20 mg by mouth daily., Disp: , Rfl:  .  isosorbide mononitrate (IMDUR) 30 MG 24 hr tablet, Take 2 tablets (60 mg total) by mouth daily. (Patient not taking: Reported on 11/24/2017), Disp: 60 tablet, Rfl: 12 .  loperamide (IMODIUM) 2 MG capsule, Take by mouth as needed for diarrhea or loose stools., Disp: , Rfl:  .  metFORMIN (GLUCOPHAGE) 1000 MG tablet, Take 1 tablet (1,000 mg total) by mouth 2 (two) times daily with a meal. (Patient not taking: Reported on 11/24/2017), Disp: 180 tablet, Rfl: 3 .   metoprolol tartrate (LOPRESSOR) 50 MG tablet, TAKE 1 TABLET BY MOUTH TWICE A DAY FOR HIGH BLOOD PRESSURE (Patient not taking: Reported on 11/24/2017), Disp: 60 tablet, Rfl: 12 .  nitroGLYCERIN (NITROSTAT) 0.4 MG SL tablet, Place 1 tablet (0.4 mg total) under the tongue every 5 (five) minutes as needed for chest pain. (Patient not taking: Reported on 11/24/2017), Disp: 30 tablet, Rfl: 0 .  omeprazole (PRILOSEC) 20 MG capsule, Take 1 capsule (20 mg total) by mouth daily. (Patient not taking: Reported on 11/24/2017), Disp: 30 capsule, Rfl: 3 .  ondansetron (ZOFRAN-ODT) 8 MG disintegrating tablet, DISSOLVE 1 TABLET ON THE TONGUE EVERY 8 HOURS AS NEEDED FOR NAUSEA OR VOMITING (Patient not taking: Reported on 11/24/2017), Disp: 30 tablet, Rfl: 1 .  rosuvastatin (CRESTOR) 10 MG tablet, Take 1 tablet (10 mg total) by mouth daily. (Patient not taking: Reported on 11/24/2017), Disp: 90 tablet, Rfl: 3 .  sucralfate (CARAFATE) 1 g tablet, Take 1 tablet (1 g total) by mouth 2 (two) times daily. (Patient not taking: Reported on 11/24/2017), Disp: 60 tablet, Rfl: 5 .  tiZANidine (ZANAFLEX) 2 MG tablet, Take 2 mg by mouth at bedtime. , Disp: , Rfl:   Past Medical History: Past Medical History:  Diagnosis Date  . Allergy   . Anemia   . Anxiety   . Arrhythmia   . Arthritis   . Coronary artery disease   . Depression   .  Diabetes mellitus without complication ([place])   . Dyspnea    doe  . Dysrhythmia   . GERD (gastroesophageal reflux disease)   . Headache   . History of hiatal hernia   . Hyperlipidemia   . Hypertension   . Myocardial infarction ([place])    2016, 04/2017  . Myocardial infarction with cardiac rehabilitation [facility])    MI 2016/ CABG 8/17    FINISHED CARDIAC REHAB 3 WEEKS AGO  . Panic attack   . Pneumonia   . Reflux   . Stroke [facility]) 2015   showed up on MRI; no weakness noted  . TIA (transient ischemic attack)   . Voice tremor     Tobacco Use: Social History   Tobacco Use  Smoking Status Former  Smoker  . Packs/day: 0.50  . Types: Cigarettes  . Last attempt to quit: 10/07/2001  . Years since quitting: 16.1  Smokeless Tobacco Never Used    Labs: Recent Chemical engineer    Labs for ITP Cardiac and Pulmonary Rehab Latest Ref Rng & Units 08/18/2016 10/01/2016 12/23/2016 03/24/2017 08/16/2017   Cholestrol 100 - 199 mg/dL - 259(H) - - -   LDLCALC 0 - 99 mg/dL - 173(H) - - -   HDL >39 mg/dL - 66 - - -   Trlycerides 0 - 149 mg/dL - 100 - - -   Hemoglobin A1c 4.8 - 5.6 % 8.1 6.9(H) 7.3(H) 7.8(H) 8.0(H)   PHART 7.350 - 7.450 - - - - -   PCO2ART 35.0 - 45.0 mmHg - - - - -   HCO3 20.0 - 24.0 mEq/L - - - - -   TCO2 0 - 100 mmol/L - - - - -   O2SAT % - - - - -       Exercise Target Goals: Exercise Program Goal: Individual exercise prescription set using results from initial 6 min walk test and THRR while considering  patient's activity barriers and safety.   Exercise Prescription Goal: Initial exercise prescription builds to 30-45 minutes a day of aerobic activity, 2-3 days per week.  Home exercise guidelines will be given to patient during program as part of exercise prescription that the participant will acknowledge.  Activity Barriers & Risk Stratification: Activity Barriers & Cardiac Risk Stratification - 11/24/17 1525      Activity Barriers & Cardiac Risk Stratification   Activity Barriers  Back Problems;Deconditioning;Shortness of Breath;Chest Pain/Angina;Decreased Ventricular Function;Muscular Weakness;Other (comment);Balance Concerns    Comments  Recent TIA, still having some headaches caused by compression in cervial spine, chest pain is improving (but has not been taking her Imdur)    Cardiac Risk Stratification  High       6 Minute Walk: 6 Minute Walk    Row Name 11/24/17 1524         6 Minute Walk   Phase  Initial     Distance  1142 feet     Walk Time  6 minutes     # of Rest Breaks  0     MPH  2.16     METS  3.5     RPE  9     Perceived Dyspnea   0      VO2 Peak  12.25     Symptoms  No     Resting HR  99 bpm     Resting BP  122/74     Resting Oxygen Saturation   95 %     Exercise Oxygen Saturation  during  6 min walk  98 %     Max Ex. HR  147 bpm     Max Ex. BP  174/88     2 Minute Post BP  134/84        Oxygen Initial Assessment:   Oxygen Re-Evaluation:   Oxygen Discharge (Final Oxygen Re-Evaluation):   Initial Exercise Prescription: Initial Exercise Prescription - 11/24/17 1500      Date of Initial Exercise RX and Referring Provider   Date  11/24/17    Referring Provider  [name]      Treadmill   MPH  2.1    Grade  1    Minutes  15    METs  2.91      NuStep   Level  2    SPM  80    Minutes  15    METs  2.9      REL-XR   Level  2    Speed  50    Minutes  15    METs  2.9      Prescription Details   Frequency (times per week)  2    Duration  Progress to 30 minutes of continuous aerobic without signs/symptoms of physical distress      Intensity   THRR 40-80% of Max Heartrate  121-143    Ratings of Perceived Exertion  11-13    Perceived Dyspnea  0-4      Progression   Progression  Continue to progress workloads to maintain intensity without signs/symptoms of physical distress.      Resistance Training   Training Prescription  Yes    Weight  3 lbs    Reps  10-15       Perform Capillary Blood Glucose checks as needed.  Exercise Prescription Changes: Exercise Prescription Changes    Row Name 11/24/17 1500             Response to Exercise   Blood Pressure (Admit)  122/74       Blood Pressure (Exercise)  174/88       Blood Pressure (Exit)  134/84       Heart Rate (Admit)  99 bpm       Heart Rate (Exercise)  147 bpm       Heart Rate (Exit)  103 bpm       Oxygen Saturation (Admit)  95 %       Oxygen Saturation (Exercise)  98 %       Rating of Perceived Exertion (Exercise)  9       Perceived Dyspnea (Exercise)  0       Symptoms  none       Comments  walk test results        Duration  Continue with 30 min of aerobic exercise without signs/symptoms of physical distress.          Exercise Comments:   Exercise Goals and Review: Exercise Goals    Row Name 11/24/17 1530             Exercise Goals   Increase Physical Activity  Yes       Intervention  Provide advice, education, support and counseling about physical activity/exercise needs.;Develop an individualized exercise prescription for aerobic and resistive training based on initial evaluation findings, risk stratification, comorbidities and participant's personal goals.       Expected Outcomes  Short Term: Attend rehab on a regular basis to increase amount of physical activity.;Long Term: Add in  home exercise to make exercise part of routine and to increase amount of physical activity.;Long Term: Exercising regularly at least 3-5 days a week.       Increase Strength and Stamina  Yes       Intervention  Provide advice, education, support and counseling about physical activity/exercise needs.;Develop an individualized exercise prescription for aerobic and resistive training based on initial evaluation findings, risk stratification, comorbidities and participant's personal goals.       Expected Outcomes  Short Term: Increase workloads from initial exercise prescription for resistance, speed, and METs.;Short Term: Perform resistance training exercises routinely during rehab and add in resistance training at home;Long Term: Improve cardiorespiratory fitness, muscular endurance and strength as measured by increased METs and functional capacity (6MWT)       Able to understand and use rate of perceived exertion (RPE) scale  Yes       Intervention  Provide education and explanation on how to use RPE scale       Expected Outcomes  Short Term: Able to use RPE daily in rehab to express subjective intensity level;Long Term:  Able to use RPE to guide intensity level when exercising independently       Able to understand and  use Dyspnea scale  Yes       Intervention  Provide education and explanation on how to use Dyspnea scale       Expected Outcomes  Short Term: Able to use Dyspnea scale daily in rehab to express subjective sense of shortness of breath during exertion;Long Term: Able to use Dyspnea scale to guide intensity level when exercising independently       Knowledge and understanding of Target Heart Rate Range (THRR)  Yes       Intervention  Provide education and explanation of THRR including how the numbers were predicted and where they are located for reference       Expected Outcomes  Short Term: Able to use daily as guideline for intensity in rehab;Short Term: Able to state/look up THRR;Long Term: Able to use THRR to govern intensity when exercising independently       Able to check pulse independently  Yes       Intervention  Provide education and demonstration on how to check pulse in carotid and radial arteries.;Review the importance of being able to check your own pulse for safety during independent exercise       Expected Outcomes  Short Term: Able to explain why pulse checking is important during independent exercise;Long Term: Able to check pulse independently and accurately       Understanding of Exercise Prescription  Yes       Intervention  Provide education, explanation, and written materials on patient's individual exercise prescription       Expected Outcomes  Short Term: Able to explain program exercise prescription;Long Term: Able to explain home exercise prescription to exercise independently          Exercise Goals Re-Evaluation :   Discharge Exercise Prescription (Final Exercise Prescription Changes): Exercise Prescription Changes - 11/24/17 1500      Response to Exercise   Blood Pressure (Admit)  122/74    Blood Pressure (Exercise)  174/88    Blood Pressure (Exit)  134/84    Heart Rate (Admit)  99 bpm    Heart Rate (Exercise)  147 bpm    Heart Rate (Exit)  103 bpm    Oxygen  Saturation (Admit)  95 %    Oxygen Saturation (Exercise)  98 %    Rating of Perceived Exertion (Exercise)  9    Perceived Dyspnea (Exercise)  0    Symptoms  none    Comments  walk test results    Duration  Continue with 30 min of aerobic exercise without signs/symptoms of physical distress.       Nutrition:  Target Goals: Understanding of nutrition guidelines, daily intake of sodium <1545m, cholesterol <2029m calories 30% from fat and 7% or less from saturated fats, daily to have 5 or more servings of fruits and vegetables.  Biometrics: Pre Biometrics - 11/24/17 1532      Pre Biometrics   Height  _0  (1.575 m)    Weight  169 lb 4.8 oz (76.8 kg)    Waist Circumference  36.5 inches    Hip Circumference  40.25 inches    Waist to Hip Ratio  0.91 %    BMI (Calculated)  30.96    Single Leg Stand  5.78 seconds        Nutrition Therapy Plan and Nutrition Goals: Nutrition Therapy & Goals - 11/24/17 1537      Intervention Plan   Intervention  Prescribe, educate and counsel regarding individualized specific dietary modifications aiming towards targeted core components such as weight, hypertension, lipid management, diabetes, heart failure and other comorbidities.;Nutrition handout(s) given to patient.    Expected Outcomes  Short Term Goal: Understand basic principles of dietary content, such as calories, fat, sodium, cholesterol and nutrients.;Short Term Goal: A plan has been developed with personal nutrition goals set during dietitian appointment.;Long Term Goal: Adherence to prescribed nutrition plan.       Nutrition Assessments: Nutrition Assessments - 11/24/17 1537      MEDFICTS Scores   Pre Score  6       Nutrition Goals Re-Evaluation:   Nutrition Goals Discharge (Final Nutrition Goals Re-Evaluation):   Psychosocial: Target Goals: Acknowledge presence or absence of significant depression and/or stress, maximize coping skills, provide positive support system.  Participant is able to verbalize types and ability to use techniques and skills needed for reducing stress and depression.   Initial Review & Psychosocial Screening: Initial Psych Review & Screening - 11/24/17 1533      Initial Review   Current issues with  History of Depression;Current Sleep Concerns;Current Stress Concerns    Source of Stress Concerns  Chronic Illness;Transportation;Unable to perform yard/household activities;Unable to participate in former interests or hobbies;Occupation    Comments  Taviana wants to go back to work.  She is still not able to drive but has appointment to review this on 12/08/17.  Her family has been taking her places and she is using lyft with insurance to get to some appointments. She has been sleeping better since last here, but still not where she wants to be.  She currently denies an depression symptoms.       Family Dynamics   Good Support System?  Yes    Comments  Daisee's family is her support system.  Her sister brought her to orientation today.       Barriers   Psychosocial barriers to participate in program  The patient should benefit from training in stress management and relaxation.;Psychosocial barriers identified (see note)      Screening Interventions   Interventions  Encouraged to exercise;Program counselor consult;Provide feedback about the scores to participant    Expected Outcomes  Short Term goal: Utilizing psychosocial counselor, staff and physician to assist with identification of specific Stressors or current issues interfering  with healing process. Setting desired goal for each stressor or current issue identified.;Long Term Goal: Stressors or current issues are controlled or eliminated.;Long Term goal: The participant improves quality of Life and PHQ9 Scores as seen by post scores and/or verbalization of changes;Short Term goal: Identification and review with participant of any Quality of Life or Depression concerns found by scoring the  questionnaire.       Quality of Life Scores:  Quality of Life - 11/24/17 1537      Quality of Life   Select  Quality of Life      Quality of Life Scores   Health/Function Pre  17.43 %    Socioeconomic Pre  15.71 %    Psych/Spiritual Pre  23 %    Family Pre  18.38 %    GLOBAL Pre  18.39 %      Scores of 19 and below usually indicate a poorer quality of life in these areas.  A difference of  2-3 points is a clinically meaningful difference.  A difference of 2-3 points in the total score of the Quality of Life Index has been associated with significant improvement in overall quality of life, self-image, physical symptoms, and general health in studies assessing change in quality of life.  PHQ-9: Recent Review Flowsheet Data    Depression screen [facility] 2/9 11/24/2017 08/04/2017 06/02/2017 04/25/2017 11/04/2016   Decreased Interest 0 1 2 0 0   Down, Depressed, Hopeless 0 0 1 0 0   PHQ - 2 Score 0 1 3 0 0   Altered sleeping _0 0   Tired, decreased energy _1 0   Change in appetite 1 0 2 2 0   Feeling bad or failure about yourself  0 0 1 0 0   Trouble concentrating 0 0 1 0 0   Moving slowly or fidgety/restless _2 0 0   Suicidal thoughts 0 0 0 2  -   PHQ-9 Score _3 0   Difficult doing work/chores Not difficult at all Not difficult at all Not difficult at all Somewhat difficult -     Interpretation of Total Score  Total Score Depression Severity:  1-4 = Minimal depression, 5-9 = Mild depression, 10-14 = Moderate depression, 15-19 = Moderately severe depression, 20-27 = Severe depression   Psychosocial Evaluation and Intervention:   Psychosocial Re-Evaluation:   Psychosocial Discharge (Final Psychosocial Re-Evaluation):   Vocational Rehabilitation: Provide vocational rehab assistance to qualifying candidates.   Vocational Rehab Evaluation & Intervention: Vocational Rehab - 11/24/17 1538      Initial Vocational Rehab Evaluation & Intervention   Assessment shows  need for Vocational Rehabilitation  No       Education: Education Goals: Education classes will be provided on a variety of topics geared toward better understanding of heart health and risk factor modification. Participant will state understanding/return demonstration of topics presented as noted by education test scores.  Learning Barriers/Preferences: Learning Barriers/Preferences - 11/24/17 1537      Learning Barriers/Preferences   Learning Barriers  None    Learning Preferences  None       Education Topics:  AED/CPR: - Group verbal and written instruction with the use of models to demonstrate the basic use of the AED with the basic ABC's of resuscitation.   Cardiac Rehab from 08/04/2017 in [facility] Cardiac and Pulmonary Rehab  Date  06/30/17  Educator  CE  Instruction Review Code  1- Verbalizes Understanding  General Nutrition Guidelines/Fats and Fiber: -Group instruction provided by verbal, written material, models and posters to present the general guidelines for heart healthy nutrition. Gives an explanation and review of dietary fats and fiber.   Cardiac Rehab from 04/08/2016 in [facility] Cardiac and Pulmonary Rehab  Date  04/06/16  Educator  PI  Instruction Review Code (retired)  2- meets goals/outcomes      Controlling Sodium/Reading Food Labels: -Group verbal and written material supporting the discussion of sodium use in heart healthy nutrition. Review and explanation with models, verbal and written materials for utilization of the food label.   Cardiac Rehab from 08/04/2017 in [facility] Cardiac and Pulmonary Rehab  Date  06/28/17  Educator  PI  Instruction Review Code  1- Verbalizes Understanding      Exercise Physiology & General Exercise Guidelines: - Group verbal and written instruction with models to review the exercise physiology of the cardiovascular system and associated critical values. Provides general exercise guidelines with specific guidelines to those with  heart or lung disease.    Cardiac Rehab from 08/04/2017 in [facility] Cardiac and Pulmonary Rehab  Date  07/07/17  Educator  [facility]  Instruction Review Code  1- Verbalizes Understanding      Aerobic Exercise & Resistance Training: - Gives group verbal and written instruction on the various components of exercise. Focuses on aerobic and resistive training programs and the benefits of this training and how to safely progress through these programs..   Cardiac Rehab from 08/04/2017 in [facility] Cardiac and Pulmonary Rehab  Date  07/12/17  Educator  AS  Instruction Review Code  1- Verbalizes Understanding      Flexibility, Balance, Mind/Body Relaxation: Provides group verbal/written instruction on the benefits of flexibility and balance training, including mind/body exercise modes such as yoga, pilates and tai chi.  Demonstration and skill practice provided.   Cardiac Rehab from 04/08/2016 in [facility] Cardiac and Pulmonary Rehab  Date  02/26/16  Educator  [facility]  Instruction Review Code (retired)  2- meets goals/outcomes      Stress and Anxiety: - Provides group verbal and written instruction about the health risks of elevated stress and causes of high stress.  Discuss the correlation between heart/lung disease and anxiety and treatment options. Review healthy ways to manage with stress and anxiety.   Cardiac Rehab from 08/04/2017 in [facility] Cardiac and Pulmonary Rehab  Date  07/26/17  Educator  [facility]  Instruction Review Code  1- Verbalizes Understanding      Depression: - Provides group verbal and written instruction on the correlation between heart/lung disease and depressed mood, treatment options, and the stigmas associated with seeking treatment.   Cardiac Rehab from 08/04/2017 in [facility] Cardiac and Pulmonary Rehab  Date  07/14/17  Educator  [facility]  Instruction Review Code  1- Verbalizes Understanding      Anatomy & Physiology of the Heart: - Group verbal and written instruction and models provide basic cardiac  anatomy and physiology, with the coronary electrical and arterial systems. Review of Valvular disease and Heart Failure   Cardiac Rehab from 08/04/2017 in [facility] Cardiac and Pulmonary Rehab  Date  07/28/17  Educator  CE  Instruction Review Code  1- Verbalizes Understanding      Cardiac Procedures: - Group verbal and written instruction to review commonly prescribed medications for heart disease. Reviews the medication, class of the drug, and side effects. Includes the steps to properly store meds and maintain the prescription regimen. (beta blockers and nitrates)   Cardiac Rehab from  08/04/2017 in [facility] Cardiac and Pulmonary Rehab  Date  06/23/17  Educator  CE  Instruction Review Code  1- Verbalizes Understanding      Cardiac Medications I: - Group verbal and written instruction to review commonly prescribed medications for heart disease. Reviews the medication, class of the drug, and side effects. Includes the steps to properly store meds and maintain the prescription regimen.   Cardiac Rehab from 08/04/2017 in [facility] Cardiac and Pulmonary Rehab  Date  08/02/17  Educator  SB  Instruction Review Code  1- Verbalizes Understanding      Cardiac Medications II: -Group verbal and written instruction to review commonly prescribed medications for heart disease. Reviews the medication, class of the drug, and side effects. (all other drug classes)   Cardiac Rehab from 08/04/2017 in [facility] Cardiac and Pulmonary Rehab  Date  07/21/17  Educator  CE  Instruction Review Code  1- Verbalizes Understanding       Go Sex-Intimacy & Heart Disease, Get SMART - Goal Setting: - Group verbal and written instruction through game format to discuss heart disease and the return to sexual intimacy. Provides group verbal and written material to discuss and apply goal setting through the application of the S.M.A.R.T. Method.   Cardiac Rehab from 08/04/2017 in [facility] Cardiac and Pulmonary Rehab  Date  06/23/17  Educator  CE    Instruction Review Code  1- Verbalizes Understanding      Other Matters of the Heart: - Provides group verbal, written materials and models to describe Stable Angina and Peripheral Artery. Includes description of the disease process and treatment options available to the cardiac patient.   Exercise & Equipment Safety: - Individual verbal instruction and demonstration of equipment use and safety with use of the equipment.   Cardiac Rehab from 11/24/2017 in [facility] Cardiac and Pulmonary Rehab  Date  11/24/17  Educator   [facility]  Instruction Review Code  1- Verbalizes Understanding      Infection Prevention: - Provides verbal and written material to individual with discussion of infection control including proper hand washing and proper equipment cleaning during exercise session.   Cardiac Rehab from 11/24/2017 in [facility] Cardiac and Pulmonary Rehab  Date  11/24/17  Educator  [facility]  Instruction Review Code  1- Verbalizes Understanding      Falls Prevention: - Provides verbal and written material to individual with discussion of falls prevention and safety.   Cardiac Rehab from 11/24/2017 in [facility] Cardiac and Pulmonary Rehab  Date  11/24/17  Educator  [facility]  Instruction Review Code  1- Verbalizes Understanding      Diabetes: - Individual verbal and written instruction to review signs/symptoms of diabetes, desired ranges of glucose level fasting, after meals and with exercise. Acknowledge that pre and post exercise glucose checks will be done for 3 sessions at entry of program.   Cardiac Rehab from 11/24/2017 in [facility] Cardiac and Pulmonary Rehab  Date  11/24/17  Educator  [facility]  Instruction Review Code  1- Verbalizes Understanding      Know Your Numbers and Risk Factors: -Group verbal and written instruction about important numbers in your health.  Discussion of what are risk factors and how they play a role in the disease process.  Review of Cholesterol, Blood Pressure, Diabetes, and BMI and the  role they play in your overall health.   Cardiac Rehab from 08/04/2017 in [facility] Cardiac and Pulmonary Rehab  Date  07/21/17  Educator  [facility]  Instruction Review Code  1- [employer]  Understanding      Sleep Hygiene: -Provides group verbal and written instruction about how sleep can affect your health.  Define sleep hygiene, discuss sleep cycles and impact of sleep habits. Review good sleep hygiene tips.    Other: -Provides group and verbal instruction on various topics (see comments)   Knowledge Questionnaire Score: Knowledge Questionnaire Score - 11/24/17 1538      Knowledge Questionnaire Score   Pre Score  24/26   Test reviewed with pt today.  Education Focus: PAD, Nutrition      Core Components/Risk Factors/Patient Goals at Admission: Personal Goals and Risk Factors at Admission - 11/24/17 1539      Core Components/Risk Factors/Patient Goals on Admission    Weight Management  Yes;Obesity;Weight Loss    Intervention  Weight Management: Develop a combined nutrition and exercise program designed to reach desired caloric intake, while maintaining appropriate intake of nutrient and fiber, sodium and fats, and appropriate energy expenditure required for the weight goal.;Weight Management: Provide education and appropriate resources to help participant work on and attain dietary goals.;Weight Management/Obesity: Establish reasonable short term and long term weight goals.;Obesity: Provide education and appropriate resources to help participant work on and attain dietary goals.    Admit Weight  169 lb 4.8 oz (76.8 kg)    Goal Weight: Short Term  159 lb (72.1 kg)    Goal Weight: Long Term  145 lb (65.8 kg)    Expected Outcomes  Short Term: Continue to assess and modify interventions until short term weight is achieved;Long Term: Adherence to nutrition and physical activity/exercise program aimed toward attainment of established weight goal;Weight Loss: Understanding of general recommendations for  a balanced deficit meal plan, which promotes 1-2 lb weight loss per week and includes a negative energy balance of [phone] kcal/d;Understanding recommendations for meals to include 15-35% energy as protein, 25-35% energy from fat, 35-60% energy from carbohydrates, less than 284m of dietary cholesterol, 20-35 gm of total fiber daily;Understanding of distribution of calorie intake throughout the day with the consumption of 4-5 meals/snacks    Diabetes  Yes    Intervention  Provide education about signs/symptoms and action to take for hypo/hyperglycemia.;Provide education about proper nutrition, including hydration, and aerobic/resistive exercise prescription along with prescribed medications to achieve blood glucose in normal ranges: Fasting glucose 65-99 mg/dL    Expected Outcomes  Short Term: Participant verbalizes understanding of the signs/symptoms and immediate care of hyper/hypoglycemia, proper foot care and importance of medication, aerobic/resistive exercise and nutrition plan for blood glucose control.;Long Term: Attainment of HbA1C < 7%.    Heart Failure  Yes    Intervention  Provide a combined exercise and nutrition program that is supplemented with education, support and counseling about heart failure. Directed toward relieving symptoms such as shortness of breath, decreased exercise tolerance, and extremity edema.    Expected Outcomes  Improve functional capacity of life;Short term: Attendance in program 2-3 days a week with increased exercise capacity. Reported lower sodium intake. Reported increased fruit and vegetable intake. Reports medication compliance.;Short term: Daily weights obtained and reported for increase. Utilizing diuretic protocols set by physician.;Long term: Adoption of self-care skills and reduction of barriers for early signs and symptoms recognition and intervention leading to self-care maintenance.    Hypertension  Yes    Intervention  Provide education on lifestyle  modifcations including regular physical activity/exercise, weight management, moderate sodium restriction and increased consumption of fresh fruit, vegetables, and low fat dairy, alcohol moderation, and smoking cessation.;Monitor prescription use compliance.  Expected Outcomes  Short Term: Continued assessment and intervention until BP is < 140/9m HG in hypertensive participants. < 130/869mHG in hypertensive participants with diabetes, heart failure or chronic kidney disease.;Long Term: Maintenance of blood pressure at goal levels.    Lipids  Yes    Intervention  Provide education and support for participant on nutrition & aerobic/resistive exercise along with prescribed medications to achieve LDL <7016mHDL >42m21m  Expected Outcomes  Short Term: Participant states understanding of desired cholesterol values and is compliant with medications prescribed. Participant is following exercise prescription and nutrition guidelines.;Long Term: Cholesterol controlled with medications as prescribed, with individualized exercise RX and with personalized nutrition plan. Value goals: LDL < 70mg68mL > 40 mg.       Core Components/Risk Factors/Patient Goals Review:    Core Components/Risk Factors/Patient Goals at Discharge (Final Review):    ITP Comments: ITP Comments    Row Name 11/24/17 1231 11/24/17 1519         ITP Comments  Catarina does not want to be on any medications and has stopped taking her medications.  Her heart rate was elevated today since she hasn't taken her .  We talked about her needing to take medications for her safety and will required to take them to attend rehab. She promises to take her heart meds and diabetes.   Medical evaluation completed.  [facility]  Initial ITP created and sent for review to Dr. Mark Emily Filbertical Director of Cardiac Rehab.          Comments: Initial ITP

## 2017-12-05 ENCOUNTER — Other Ambulatory Visit: Payer: Self-pay | Admitting: Family Medicine

## 2017-12-06 ENCOUNTER — Encounter: Payer: Medicare Other | Attending: Internal Medicine

## 2017-12-06 DIAGNOSIS — Z7984 Long term (current) use of oral hypoglycemic drugs: Secondary | ICD-10-CM | POA: Insufficient documentation

## 2017-12-06 DIAGNOSIS — K219 Gastro-esophageal reflux disease without esophagitis: Secondary | ICD-10-CM | POA: Diagnosis not present

## 2017-12-06 DIAGNOSIS — I25118 Atherosclerotic heart disease of native coronary artery with other forms of angina pectoris: Secondary | ICD-10-CM | POA: Diagnosis not present

## 2017-12-06 DIAGNOSIS — I10 Essential (primary) hypertension: Secondary | ICD-10-CM | POA: Insufficient documentation

## 2017-12-06 DIAGNOSIS — D649 Anemia, unspecified: Secondary | ICD-10-CM | POA: Diagnosis not present

## 2017-12-06 DIAGNOSIS — I208 Other forms of angina pectoris: Secondary | ICD-10-CM

## 2017-12-06 DIAGNOSIS — Z87891 Personal history of nicotine dependence: Secondary | ICD-10-CM | POA: Insufficient documentation

## 2017-12-06 DIAGNOSIS — E119 Type 2 diabetes mellitus without complications: Secondary | ICD-10-CM | POA: Diagnosis not present

## 2017-12-06 DIAGNOSIS — Z7902 Long term (current) use of antithrombotics/antiplatelets: Secondary | ICD-10-CM | POA: Diagnosis not present

## 2017-12-06 DIAGNOSIS — F419 Anxiety disorder, unspecified: Secondary | ICD-10-CM | POA: Diagnosis not present

## 2017-12-06 DIAGNOSIS — Z9861 Coronary angioplasty status: Secondary | ICD-10-CM

## 2017-12-06 DIAGNOSIS — I252 Old myocardial infarction: Secondary | ICD-10-CM | POA: Insufficient documentation

## 2017-12-06 DIAGNOSIS — E785 Hyperlipidemia, unspecified: Secondary | ICD-10-CM | POA: Insufficient documentation

## 2017-12-06 DIAGNOSIS — F329 Major depressive disorder, single episode, unspecified: Secondary | ICD-10-CM | POA: Diagnosis not present

## 2017-12-06 DIAGNOSIS — Z79899 Other long term (current) drug therapy: Secondary | ICD-10-CM | POA: Diagnosis not present

## 2017-12-06 DIAGNOSIS — Z8673 Personal history of transient ischemic attack (TIA), and cerebral infarction without residual deficits: Secondary | ICD-10-CM | POA: Diagnosis not present

## 2017-12-06 DIAGNOSIS — Z951 Presence of aortocoronary bypass graft: Secondary | ICD-10-CM

## 2017-12-06 NOTE — Progress Notes (Signed)
Daily Session Note  Patient Details  Name: Candace [name] MRN: [id number] Date of Birth: 12-23-1950 Referring Provider:     Cardiac Rehab from 11/24/2017 in [facility] Cardiac and Pulmonary Rehab  Referring Provider  [name]      Encounter Date: 12/06/2017  Check In:      Social History   Tobacco Use  Smoking Status Former Smoker  . Packs/day: 0.50  . Types: Cigarettes  . Last attempt to quit: 10/07/2001  . Years since quitting: 16.1  Smokeless Tobacco Never Used    Goals Met:  Independence with exercise equipment Exercise tolerated well No report of cardiac concerns or symptoms Strength training completed today  Goals Unmet:  Not Applicable  Comments: First full day of exercise!  Patient was oriented to gym and equipment including functions, settings, policies, and procedures.  Patient's individual exercise prescription and treatment plan were reviewed.  All starting workloads were established based on the results of the 6 minute walk test done at initial orientation visit.  The plan for exercise progression was also introduced and progression will be customized based on patient's performance and goals.   Dr. Emily Filbert is Medical Director for [facility].

## 2017-12-07 ENCOUNTER — Encounter: Payer: Self-pay | Admitting: *Deleted

## 2017-12-07 DIAGNOSIS — I208 Other forms of angina pectoris: Secondary | ICD-10-CM

## 2017-12-07 NOTE — Progress Notes (Signed)
Cardiac Individual Treatment Plan  Patient Details  Name: FLOREEN TEEGARDEN MRN: [id number] Date of Birth: [birth year] Referring Provider:     Cardiac Rehab from 11/24/2017 in [facility]  Referring Provider  [name]      Initial Encounter Date:    Cardiac Rehab from 11/24/2017 in [facility] Cardiac and Pulmonary Rehab  Date  11/24/17      Visit Diagnosis: Chronic stable angina ([place])  Patient's Home Medications on Admission:  Current Outpatient Medications:  .  acetaminophen (TYLENOL) 500 MG tablet, Take 2 tablets (1,000 mg total) by mouth every 8 (eight) hours as needed., Disp: 60 tablet, Rfl: 1 .  albuterol (PROVENTIL HFA;VENTOLIN HFA) 108 (90 Base) MCG/ACT inhaler, Inhale 2 puffs into the lungs every 6 (six) hours as needed for wheezing or shortness of breath. (Patient not taking: Reported on 11/24/2017), Disp: 1 Inhaler, Rfl: 2 .  clopidogrel (PLAVIX) 75 MG tablet, Take 75 mg by mouth daily., Disp: , Rfl:  .  Coenzyme Q10 (CO Q 10) 100 MG CAPS, Take 100 mg by mouth daily. (Patient not taking: Reported on 11/24/2017), Disp: 30 capsule, Rfl: 12 .  [employer] ASPIRIN ADULT LOW DOSE 81 MG chewable tablet, CHEW 1 TABLET (81 MG TOTAL) BY MOUTH DAILY. (Patient not taking: Reported on 11/24/2017), Disp: 90 tablet, Rfl: 3 .  cyproheptadine (PERIACTIN) 4 MG tablet, Take 4 mg by mouth 3 (three) times daily as needed for allergies., Disp: , Rfl:  .  furosemide (LASIX) 20 MG tablet, Take 20 mg by mouth daily., Disp: , Rfl:  .  isosorbide mononitrate (IMDUR) 30 MG 24 hr tablet, Take 2 tablets (60 mg total) by mouth daily. (Patient not taking: Reported on 11/24/2017), Disp: 60 tablet, Rfl: 12 .  isosorbide mononitrate (IMDUR) 30 MG 24 hr tablet, TAKE 1 TABLET (30 MG TOTAL) BY MOUTH 2 (TWO) TIMES DAILY., Disp: 180 tablet, Rfl: 4 .  loperamide (IMODIUM) 2 MG capsule, Take by mouth as needed for diarrhea or loose stools., Disp: , Rfl:  .  metFORMIN (GLUCOPHAGE) 1000 MG tablet, Take 1  tablet (1,000 mg total) by mouth 2 (two) times daily with a meal. (Patient not taking: Reported on 11/24/2017), Disp: 180 tablet, Rfl: 3 .  metoprolol tartrate (LOPRESSOR) 50 MG tablet, TAKE 1 TABLET BY MOUTH TWICE A DAY FOR HIGH BLOOD PRESSURE (Patient not taking: Reported on 11/24/2017), Disp: 60 tablet, Rfl: 12 .  nitroGLYCERIN (NITROSTAT) 0.4 MG SL tablet, Place 1 tablet (0.4 mg total) under the tongue every 5 (five) minutes as needed for chest pain. (Patient not taking: Reported on 11/24/2017), Disp: 30 tablet, Rfl: 0 .  omeprazole (PRILOSEC) 20 MG capsule, Take 1 capsule (20 mg total) by mouth daily. (Patient not taking: Reported on 11/24/2017), Disp: 30 capsule, Rfl: 3 .  ondansetron (ZOFRAN-ODT) 8 MG disintegrating tablet, DISSOLVE 1 TABLET ON THE TONGUE EVERY 8 HOURS AS NEEDED FOR NAUSEA OR VOMITING (Patient not taking: Reported on 11/24/2017), Disp: 30 tablet, Rfl: 1 .  rosuvastatin (CRESTOR) 10 MG tablet, Take 1 tablet (10 mg total) by mouth daily. (Patient not taking: Reported on 11/24/2017), Disp: 90 tablet, Rfl: 3 .  sucralfate (CARAFATE) 1 g tablet, Take 1 tablet (1 g total) by mouth 2 (two) times daily. (Patient not taking: Reported on 11/24/2017), Disp: 60 tablet, Rfl: 5 .  tiZANidine (ZANAFLEX) 2 MG tablet, Take 2 mg by mouth at bedtime. , Disp: , Rfl:   Past Medical History: Past Medical History:  Diagnosis Date  . Allergy   .  Anemia   . Anxiety   . Arrhythmia   . Arthritis   . Coronary artery disease   . Depression   . Diabetes mellitus without complication ([place])   . Dyspnea    doe  . Dysrhythmia   . GERD (gastroesophageal reflux disease)   . Headache   . History of hiatal hernia   . Hyperlipidemia   . Hypertension   . Myocardial infarction ([place])    2016, 04/2017  . Myocardial infarction with cardiac rehabilitation [facility])    MI 2016/ CABG 8/17    FINISHED CARDIAC REHAB 3 WEEKS AGO  . Panic attack   . Pneumonia   . Reflux   . Stroke [facility]) 2015   showed up on MRI; no  weakness noted  . TIA (transient ischemic attack)   . Voice tremor     Tobacco Use: Social History   Tobacco Use  Smoking Status Former Smoker  . Packs/day: 0.50  . Types: Cigarettes  . Last attempt to quit: 10/07/2001  . Years since quitting: 16.1  Smokeless Tobacco Never Used    Labs: Recent Chemical engineer    Labs for ITP Cardiac and Pulmonary Rehab Latest Ref Rng & Units 08/18/2016 10/01/2016 12/23/2016 03/24/2017 08/16/2017   Cholestrol 100 - 199 mg/dL - 259(H) - - -   LDLCALC 0 - 99 mg/dL - 173(H) - - -   HDL >39 mg/dL - 66 - - -   Trlycerides 0 - 149 mg/dL - 100 - - -   Hemoglobin A1c 4.8 - 5.6 % 8.1 6.9(H) 7.3(H) 7.8(H) 8.0(H)   PHART 7.350 - 7.450 - - - - -   PCO2ART 35.0 - 45.0 mmHg - - - - -   HCO3 20.0 - 24.0 mEq/L - - - - -   TCO2 0 - 100 mmol/L - - - - -   O2SAT % - - - - -       Exercise Target Goals: Exercise Program Goal: Individual exercise prescription set using results from initial 6 min walk test and THRR while considering  patient's activity barriers and safety.   Exercise Prescription Goal: Initial exercise prescription builds to 30-45 minutes a day of aerobic activity, 2-3 days per week.  Home exercise guidelines will be given to patient during program as part of exercise prescription that the participant will acknowledge.  Activity Barriers & Risk Stratification: Activity Barriers & Cardiac Risk Stratification - 11/24/17 1525      Activity Barriers & Cardiac Risk Stratification   Activity Barriers  Back Problems;Deconditioning;Shortness of Breath;Chest Pain/Angina;Decreased Ventricular Function;Muscular Weakness;Other (comment);Balance Concerns    Comments  Recent TIA, still having some headaches caused by compression in cervial spine, chest pain is improving (but has not been taking her Imdur)    Cardiac Risk Stratification  High       6 Minute Walk: 6 Minute Walk    Row Name 11/24/17 1524         6 Minute Walk   Phase  Initial      Distance  1142 feet     Walk Time  6 minutes     # of Rest Breaks  0     MPH  2.16     METS  3.5     RPE  9     Perceived Dyspnea   0     VO2 Peak  12.25     Symptoms  No     Resting HR  99 bpm  Resting BP  122/74     Resting Oxygen Saturation   95 %     Exercise Oxygen Saturation  during 6 min walk  98 %     Max Ex. HR  147 bpm     Max Ex. BP  174/88     2 Minute Post BP  134/84        Oxygen Initial Assessment:   Oxygen Re-Evaluation:   Oxygen Discharge (Final Oxygen Re-Evaluation):   Initial Exercise Prescription: Initial Exercise Prescription - 11/24/17 1500      Date of Initial Exercise RX and Referring Provider   Date  11/24/17    Referring Provider  [name]      Treadmill   MPH  2.1    Grade  1    Minutes  15    METs  2.91      NuStep   Level  2    SPM  80    Minutes  15    METs  2.9      REL-XR   Level  2    Speed  50    Minutes  15    METs  2.9      Prescription Details   Frequency (times per week)  2    Duration  Progress to 30 minutes of continuous aerobic without signs/symptoms of physical distress      Intensity   THRR 40-80% of Max Heartrate  121-143    Ratings of Perceived Exertion  11-13    Perceived Dyspnea  0-4      Progression   Progression  Continue to progress workloads to maintain intensity without signs/symptoms of physical distress.      Resistance Training   Training Prescription  Yes    Weight  3 lbs    Reps  10-15       Perform Capillary Blood Glucose checks as needed.  Exercise Prescription Changes: Exercise Prescription Changes    Row Name 11/24/17 1500             Response to Exercise   Blood Pressure (Admit)  122/74       Blood Pressure (Exercise)  174/88       Blood Pressure (Exit)  134/84       Heart Rate (Admit)  99 bpm       Heart Rate (Exercise)  147 bpm       Heart Rate (Exit)  103 bpm       Oxygen Saturation (Admit)  95 %       Oxygen Saturation (Exercise)  98 %       Rating  of Perceived Exertion (Exercise)  9       Perceived Dyspnea (Exercise)  0       Symptoms  none       Comments  walk test results       Duration  Continue with 30 min of aerobic exercise without signs/symptoms of physical distress.          Exercise Comments: Exercise Comments    Row Name 12/06/17 [phone]           Exercise Comments  First full day of exercise!  Patient was oriented to gym and equipment including functions, settings, policies, and procedures.  Patient's individual exercise prescription and treatment plan were reviewed.  All starting workloads were established based on the results of the 6 minute walk test done at initial orientation visit.  The plan for exercise  progression was also introduced and progression will be customized based on patient's performance and goals.          Exercise Goals and Review: Exercise Goals    Row Name 11/24/17 1530             Exercise Goals   Increase Physical Activity  Yes       Intervention  Provide advice, education, support and counseling about physical activity/exercise needs.;Develop an individualized exercise prescription for aerobic and resistive training based on initial evaluation findings, risk stratification, comorbidities and participant's personal goals.       Expected Outcomes  Short Term: Attend rehab on a regular basis to increase amount of physical activity.;Long Term: Add in home exercise to make exercise part of routine and to increase amount of physical activity.;Long Term: Exercising regularly at least 3-5 days a week.       Increase Strength and Stamina  Yes       Intervention  Provide advice, education, support and counseling about physical activity/exercise needs.;Develop an individualized exercise prescription for aerobic and resistive training based on initial evaluation findings, risk stratification, comorbidities and participant's personal goals.       Expected Outcomes  Short Term: Increase workloads from  initial exercise prescription for resistance, speed, and METs.;Short Term: Perform resistance training exercises routinely during rehab and add in resistance training at home;Long Term: Improve cardiorespiratory fitness, muscular endurance and strength as measured by increased METs and functional capacity (6MWT)       Able to understand and use rate of perceived exertion (RPE) scale  Yes       Intervention  Provide education and explanation on how to use RPE scale       Expected Outcomes  Short Term: Able to use RPE daily in rehab to express subjective intensity level;Long Term:  Able to use RPE to guide intensity level when exercising independently       Able to understand and use Dyspnea scale  Yes       Intervention  Provide education and explanation on how to use Dyspnea scale       Expected Outcomes  Short Term: Able to use Dyspnea scale daily in rehab to express subjective sense of shortness of breath during exertion;Long Term: Able to use Dyspnea scale to guide intensity level when exercising independently       Knowledge and understanding of Target Heart Rate Range (THRR)  Yes       Intervention  Provide education and explanation of THRR including how the numbers were predicted and where they are located for reference       Expected Outcomes  Short Term: Able to use daily as guideline for intensity in rehab;Short Term: Able to state/look up THRR;Long Term: Able to use THRR to govern intensity when exercising independently       Able to check pulse independently  Yes       Intervention  Provide education and demonstration on how to check pulse in carotid and radial arteries.;Review the importance of being able to check your own pulse for safety during independent exercise       Expected Outcomes  Short Term: Able to explain why pulse checking is important during independent exercise;Long Term: Able to check pulse independently and accurately       Understanding of Exercise Prescription  Yes         Intervention  Provide education, explanation, and written materials on patient's individual exercise prescription  Expected Outcomes  Short Term: Able to explain program exercise prescription;Long Term: Able to explain home exercise prescription to exercise independently          Exercise Goals Re-Evaluation : Exercise Goals Re-Evaluation    Row Name 12/06/17 0834             Exercise Goal Re-Evaluation   Exercise Goals Review  Increase Physical Activity;Increase Strength and Stamina;Able to understand and use rate of perceived exertion (RPE) scale;Knowledge and understanding of Target Heart Rate Range (THRR);Understanding of Exercise Prescription       Comments  Reviewed RPE scale, THR and program prescription with pt today.  Pt voiced understanding and was given a copy of goals to take home.        Expected Outcomes  Short: Use RPE daily to regulate intensity. Long: Follow program prescription in THR.          Discharge Exercise Prescription (Final Exercise Prescription Changes): Exercise Prescription Changes - 11/24/17 1500      Response to Exercise   Blood Pressure (Admit)  122/74    Blood Pressure (Exercise)  174/88    Blood Pressure (Exit)  134/84    Heart Rate (Admit)  99 bpm    Heart Rate (Exercise)  147 bpm    Heart Rate (Exit)  103 bpm    Oxygen Saturation (Admit)  95 %    Oxygen Saturation (Exercise)  98 %    Rating of Perceived Exertion (Exercise)  9    Perceived Dyspnea (Exercise)  0    Symptoms  none    Comments  walk test results    Duration  Continue with 30 min of aerobic exercise without signs/symptoms of physical distress.       Nutrition:  Target Goals: Understanding of nutrition guidelines, daily intake of sodium <1562m, cholesterol <2049m calories 30% from fat and 7% or less from saturated fats, daily to have 5 or more servings of fruits and vegetables.  Biometrics: Pre Biometrics - 11/24/17 1532      Pre Biometrics   Height  '5\' 2"'  (1.575  m)    Weight  169 lb 4.8 oz (76.8 kg)    Waist Circumference  36.5 inches    Hip Circumference  40.25 inches    Waist to Hip Ratio  0.91 %    BMI (Calculated)  30.96    Single Leg Stand  5.78 seconds        Nutrition Therapy Plan and Nutrition Goals: Nutrition Therapy & Goals - 12/06/17 0847      Nutrition Therapy   Diet  DM/ TLC    Protein (specify units)  11oz    Fiber  25 grams    Whole Grain Foods  3 servings    Saturated Fats  14 max. grams    Fruits and Vegetables  5 servings/day   8 ideal, working to increase fruit and vegetable intake   Sodium  1500 grams      Personal Nutrition Goals   Nutrition Goal  Because you typically eat 1-2 meals per day, it may be a good idea to consume Glucerna nutritional drinks on a more consistent basis as a meal replacement or snack between meals.    Personal Goal #2  Continue to monitor sodium intake and choose foods with less than 14080mf sodium per serving    Personal Goal #3  Prioritize protein at meal times. Previously, you mentioned that dairy may be most feasible for you to eat reagularly. For  example, drink a glass of milk or have a yogurt or cheese and crackers as a snack    Comments  She continues to c/o decreased appetite but feels that she has been doing well overall with diet. Her BG readings typically stay less than 200 and above 100. She is consuming some dairy and drinks Glucerna shakes "occasionally"      Intervention Plan   Intervention  Prescribe, educate and counsel regarding individualized specific dietary modifications aiming towards targeted core components such as weight, hypertension, lipid management, diabetes, heart failure and other comorbidities.    Expected Outcomes  Short Term Goal: Understand basic principles of dietary content, such as calories, fat, sodium, cholesterol and nutrients.;Short Term Goal: A plan has been developed with personal nutrition goals set during dietitian appointment.;Long Term Goal:  Adherence to prescribed nutrition plan.       Nutrition Assessments: Nutrition Assessments - 11/24/17 1537      MEDFICTS Scores   Pre Score  6       Nutrition Goals Re-Evaluation: Nutrition Goals Re-Evaluation    Row Name 12/06/17 0855             Goals   Nutrition Goal  Because you typically eat 1-2 meals per day, it may be a good idea to consume Glucerna nutritional drinks on a more consistent basis as a meal replacement or snack between meals.       Comment  She c/o decreased appetite but is a diabetic, requiring her to eat on a consistent schedule to best manage blood sugars       Expected Outcome  She will include Glucerna shakes on a more consistent basis to help better meet nutritional needs and manage BG          Personal Goal #2 Re-Evaluation   Personal Goal #2  Continue to monitor sodium intake and choose foods with less than 128m of sodium per serving         Personal Goal #3 Re-Evaluation   Personal Goal #3  Prioritize protein at meal times. Previously, you mentioned that diary may be most feasible for you to eat regularly. For example, drink a glass of milk or have a yogurt or cheese and crackers as a snack          Nutrition Goals Discharge (Final Nutrition Goals Re-Evaluation): Nutrition Goals Re-Evaluation - 12/06/17 0855      Goals   Nutrition Goal  Because you typically eat 1-2 meals per day, it may be a good idea to consume Glucerna nutritional drinks on a more consistent basis as a meal replacement or snack between meals.    Comment  She c/o decreased appetite but is a diabetic, requiring her to eat on a consistent schedule to best manage blood sugars    Expected Outcome  She will include Glucerna shakes on a more consistent basis to help better meet nutritional needs and manage BG       Personal Goal #2 Re-Evaluation   Personal Goal #2  Continue to monitor sodium intake and choose foods with less than 1498mof sodium per serving      Personal Goal #3  Re-Evaluation   Personal Goal #3  Prioritize protein at meal times. Previously, you mentioned that diary may be most feasible for you to eat regularly. For example, drink a glass of milk or have a yogurt or cheese and crackers as a snack       Psychosocial: Target Goals: Acknowledge presence or absence of  significant depression and/or stress, maximize coping skills, provide positive support system. Participant is able to verbalize types and ability to use techniques and skills needed for reducing stress and depression.   Initial Review & Psychosocial Screening: Initial Psych Review & Screening - 11/24/17 1533      Initial Review   Current issues with  History of Depression;Current Sleep Concerns;Current Stress Concerns    Source of Stress Concerns  Chronic Illness;Transportation;Unable to perform yard/household activities;Unable to participate in former interests or hobbies;Occupation    Comments  Zakya wants to go back to work.  She is still not able to drive but has appointment to review this on 12/08/17.  Her family has been taking her places and she is using lyft with insurance to get to some appointments. She has been sleeping better since last here, but still not where she wants to be.  She currently denies an depression symptoms.       Family Dynamics   Good Support System?  Yes    Comments  Sophy's family is her support system.  Her sister brought her to orientation today.       Barriers   Psychosocial barriers to participate in program  The patient should benefit from training in stress management and relaxation.;Psychosocial barriers identified (see note)      Screening Interventions   Interventions  Encouraged to exercise;Program counselor consult;Provide feedback about the scores to participant    Expected Outcomes  Short Term goal: Utilizing psychosocial counselor, staff and physician to assist with identification of specific Stressors or current issues interfering with healing  process. Setting desired goal for each stressor or current issue identified.;Long Term Goal: Stressors or current issues are controlled or eliminated.;Long Term goal: The participant improves quality of Life and PHQ9 Scores as seen by post scores and/or verbalization of changes;Short Term goal: Identification and review with participant of any Quality of Life or Depression concerns found by scoring the questionnaire.       Quality of Life Scores:  Quality of Life - 11/24/17 1537      Quality of Life   Select  Quality of Life      Quality of Life Scores   Health/Function Pre  17.43 %    Socioeconomic Pre  15.71 %    Psych/Spiritual Pre  23 %    Family Pre  18.38 %    GLOBAL Pre  18.39 %      Scores of 19 and below usually indicate a poorer quality of life in these areas.  A difference of  2-3 points is a clinically meaningful difference.  A difference of 2-3 points in the total score of the Quality of Life Index has been associated with significant improvement in overall quality of life, self-image, physical symptoms, and general health in studies assessing change in quality of life.  PHQ-9: Recent Review Flowsheet Data    Depression screen [facility] 2/9 11/24/2017 08/04/2017 06/02/2017 04/25/2017 11/04/2016   Decreased Interest 0 1 2 0 0   Down, Depressed, Hopeless 0 0 1 0 0   PHQ - 2 Score 0 1 3 0 0   Altered sleeping '1 3 3 3 ' 0   Tired, decreased energy '1 2 2 3 ' 0   Change in appetite 1 0 2 2 0   Feeling bad or failure about yourself  0 0 1 0 0   Trouble concentrating 0 0 1 0 0   Moving slowly or fidgety/restless '1 1 2 ' 0 0  Suicidal thoughts 0 0 0 2  -   PHQ-9 Score '4 7 14 10 ' 0   Difficult doing work/chores Not difficult at all Not difficult at all Not difficult at all Somewhat difficult -     Interpretation of Total Score  Total Score Depression Severity:  1-4 = Minimal depression, 5-9 = Mild depression, 10-14 = Moderate depression, 15-19 = Moderately severe depression, 20-27 = Severe  depression   Psychosocial Evaluation and Intervention:   Psychosocial Re-Evaluation:   Psychosocial Discharge (Final Psychosocial Re-Evaluation):   Vocational Rehabilitation: Provide vocational rehab assistance to qualifying candidates.   Vocational Rehab Evaluation & Intervention: Vocational Rehab - 11/24/17 1538      Initial Vocational Rehab Evaluation & Intervention   Assessment shows need for Vocational Rehabilitation  No       Education: Education Goals: Education classes will be provided on a variety of topics geared toward better understanding of heart health and risk factor modification. Participant will state understanding/return demonstration of topics presented as noted by education test scores.  Learning Barriers/Preferences: Learning Barriers/Preferences - 11/24/17 1537      Learning Barriers/Preferences   Learning Barriers  None    Learning Preferences  None       Education Topics:  AED/CPR: - Group verbal and written instruction with the use of models to demonstrate the basic use of the AED with the basic ABC's of resuscitation.   Cardiac Rehab from 08/04/2017 in [facility] Cardiac and Pulmonary Rehab  Date  06/30/17  Educator  CE  Instruction Review Code  1- Verbalizes Understanding      General Nutrition Guidelines/Fats and Fiber: -Group instruction provided by verbal, written material, models and posters to present the general guidelines for heart healthy nutrition. Gives an explanation and review of dietary fats and fiber.   Cardiac Rehab from 04/08/2016 in [facility] Cardiac and Pulmonary Rehab  Date  04/06/16  Educator  PI  Instruction Review Code (retired)  2- meets goals/outcomes      Controlling Sodium/Reading Food Labels: -Group verbal and written material supporting the discussion of sodium use in heart healthy nutrition. Review and explanation with models, verbal and written materials for utilization of the food label.   Cardiac Rehab from 08/04/2017  in [facility] Cardiac and Pulmonary Rehab  Date  06/28/17  Educator  PI  Instruction Review Code  1- Verbalizes Understanding      Exercise Physiology & General Exercise Guidelines: - Group verbal and written instruction with models to review the exercise physiology of the cardiovascular system and associated critical values. Provides general exercise guidelines with specific guidelines to those with heart or lung disease.    Cardiac Rehab from 08/04/2017 in [facility] Cardiac and Pulmonary Rehab  Date  07/07/17  Educator  [facility]  Instruction Review Code  1- Verbalizes Understanding      Aerobic Exercise & Resistance Training: - Gives group verbal and written instruction on the various components of exercise. Focuses on aerobic and resistive training programs and the benefits of this training and how to safely progress through these programs..   Cardiac Rehab from 08/04/2017 in [facility] Cardiac and Pulmonary Rehab  Date  07/12/17  Educator  AS  Instruction Review Code  1- Verbalizes Understanding      Flexibility, Balance, Mind/Body Relaxation: Provides group verbal/written instruction on the benefits of flexibility and balance training, including mind/body exercise modes such as yoga, pilates and tai chi.  Demonstration and skill practice provided.   Cardiac Rehab from 04/08/2016 in [facility] Cardiac and  Pulmonary Rehab  Date  02/26/16  Educator  [facility]  Instruction Review Code (retired)  2- meets goals/outcomes      Stress and Anxiety: - Provides group verbal and written instruction about the health risks of elevated stress and causes of high stress.  Discuss the correlation between heart/lung disease and anxiety and treatment options. Review healthy ways to manage with stress and anxiety.   Cardiac Rehab from 08/04/2017 in [facility] Cardiac and Pulmonary Rehab  Date  07/26/17  Educator  [facility]  Instruction Review Code  1- Verbalizes Understanding      Depression: - Provides group verbal and written instruction on  the correlation between heart/lung disease and depressed mood, treatment options, and the stigmas associated with seeking treatment.   Cardiac Rehab from 08/04/2017 in [facility] Cardiac and Pulmonary Rehab  Date  07/14/17  Educator  [facility]  Instruction Review Code  1- Verbalizes Understanding      Anatomy & Physiology of the Heart: - Group verbal and written instruction and models provide basic cardiac anatomy and physiology, with the coronary electrical and arterial systems. Review of Valvular disease and Heart Failure   Cardiac Rehab from 08/04/2017 in [facility] Cardiac and Pulmonary Rehab  Date  07/28/17  Educator  CE  Instruction Review Code  1- Verbalizes Understanding      Cardiac Procedures: - Group verbal and written instruction to review commonly prescribed medications for heart disease. Reviews the medication, class of the drug, and side effects. Includes the steps to properly store meds and maintain the prescription regimen. (beta blockers and nitrates)   Cardiac Rehab from 08/04/2017 in [facility] Cardiac and Pulmonary Rehab  Date  06/23/17  Educator  CE  Instruction Review Code  1- Verbalizes Understanding      Cardiac Medications I: - Group verbal and written instruction to review commonly prescribed medications for heart disease. Reviews the medication, class of the drug, and side effects. Includes the steps to properly store meds and maintain the prescription regimen.   Cardiac Rehab from 08/04/2017 in [facility] Cardiac and Pulmonary Rehab  Date  08/02/17  Educator  SB  Instruction Review Code  1- Verbalizes Understanding      Cardiac Medications II: -Group verbal and written instruction to review commonly prescribed medications for heart disease. Reviews the medication, class of the drug, and side effects. (all other drug classes)   Cardiac Rehab from 08/04/2017 in [facility] Cardiac and Pulmonary Rehab  Date  07/21/17  Educator  CE  Instruction Review Code  1- Verbalizes Understanding       Go  Sex-Intimacy & Heart Disease, Get SMART - Goal Setting: - Group verbal and written instruction through game format to discuss heart disease and the return to sexual intimacy. Provides group verbal and written material to discuss and apply goal setting through the application of the S.M.A.R.T. Method.   Cardiac Rehab from 08/04/2017 in [facility] Cardiac and Pulmonary Rehab  Date  06/23/17  Educator  CE  Instruction Review Code  1- Verbalizes Understanding      Other Matters of the Heart: - Provides group verbal, written materials and models to describe Stable Angina and Peripheral Artery. Includes description of the disease process and treatment options available to the cardiac patient.   Exercise & Equipment Safety: - Individual verbal instruction and demonstration of equipment use and safety with use of the equipment.   Cardiac Rehab from 11/24/2017 in [facility] Cardiac and Pulmonary Rehab  Date  11/24/17  Educator   [facility]  Instruction Review  Code  1- Verbalizes Understanding      Infection Prevention: - Provides verbal and written material to individual with discussion of infection control including proper hand washing and proper equipment cleaning during exercise session.   Cardiac Rehab from 11/24/2017 in [facility] Cardiac and Pulmonary Rehab  Date  11/24/17  Educator  [facility]  Instruction Review Code  1- Verbalizes Understanding      Falls Prevention: - Provides verbal and written material to individual with discussion of falls prevention and safety.   Cardiac Rehab from 11/24/2017 in [facility] Cardiac and Pulmonary Rehab  Date  11/24/17  Educator  [facility]  Instruction Review Code  1- Verbalizes Understanding      Diabetes: - Individual verbal and written instruction to review signs/symptoms of diabetes, desired ranges of glucose level fasting, after meals and with exercise. Acknowledge that pre and post exercise glucose checks will be done for 3 sessions at entry of program.   Cardiac Rehab from 11/24/2017  in [facility] Cardiac and Pulmonary Rehab  Date  11/24/17  Educator  [facility]  Instruction Review Code  1- Verbalizes Understanding      Know Your Numbers and Risk Factors: -Group verbal and written instruction about important numbers in your health.  Discussion of what are risk factors and how they play a role in the disease process.  Review of Cholesterol, Blood Pressure, Diabetes, and BMI and the role they play in your overall health.   Cardiac Rehab from 08/04/2017 in [facility] Cardiac and Pulmonary Rehab  Date  07/21/17  Educator  CE  Instruction Review Code  1- Verbalizes Understanding      Sleep Hygiene: -Provides group verbal and written instruction about how sleep can affect your health.  Define sleep hygiene, discuss sleep cycles and impact of sleep habits. Review good sleep hygiene tips.    Other: -Provides group and verbal instruction on various topics (see comments)   Knowledge Questionnaire Score: Knowledge Questionnaire Score - 11/24/17 1538      Knowledge Questionnaire Score   Pre Score  24/26   Test reviewed with pt today.  Education Focus: PAD, Nutrition      Core Components/Risk Factors/Patient Goals at Admission: Personal Goals and Risk Factors at Admission - 11/24/17 1539      Core Components/Risk Factors/Patient Goals on Admission    Weight Management  Yes;Obesity;Weight Loss    Intervention  Weight Management: Develop a combined nutrition and exercise program designed to reach desired caloric intake, while maintaining appropriate intake of nutrient and fiber, sodium and fats, and appropriate energy expenditure required for the weight goal.;Weight Management: Provide education and appropriate resources to help participant work on and attain dietary goals.;Weight Management/Obesity: Establish reasonable short term and long term weight goals.;Obesity: Provide education and appropriate resources to help participant work on and attain dietary goals.    Admit Weight  169 lb 4.8  oz (76.8 kg)    Goal Weight: Short Term  159 lb (72.1 kg)    Goal Weight: Long Term  145 lb (65.8 kg)    Expected Outcomes  Short Term: Continue to assess and modify interventions until short term weight is achieved;Long Term: Adherence to nutrition and physical activity/exercise program aimed toward attainment of established weight goal;Weight Loss: Understanding of general recommendations for a balanced deficit meal plan, which promotes 1-2 lb weight loss per week and includes a negative energy balance of [phone] kcal/d;Understanding recommendations for meals to include 15-35% energy as protein, 25-35% energy from fat, 35-60% energy from carbohydrates, less than  228m of dietary cholesterol, 20-35 gm of total fiber daily;Understanding of distribution of calorie intake throughout the day with the consumption of 4-5 meals/snacks    Diabetes  Yes    Intervention  Provide education about signs/symptoms and action to take for hypo/hyperglycemia.;Provide education about proper nutrition, including hydration, and aerobic/resistive exercise prescription along with prescribed medications to achieve blood glucose in normal ranges: Fasting glucose 65-99 mg/dL    Expected Outcomes  Short Term: Participant verbalizes understanding of the signs/symptoms and immediate care of hyper/hypoglycemia, proper foot care and importance of medication, aerobic/resistive exercise and nutrition plan for blood glucose control.;Long Term: Attainment of HbA1C < 7%.    Heart Failure  Yes    Intervention  Provide a combined exercise and nutrition program that is supplemented with education, support and counseling about heart failure. Directed toward relieving symptoms such as shortness of breath, decreased exercise tolerance, and extremity edema.    Expected Outcomes  Improve functional capacity of life;Short term: Attendance in program 2-3 days a week with increased exercise capacity. Reported lower sodium intake. Reported increased  fruit and vegetable intake. Reports medication compliance.;Short term: Daily weights obtained and reported for increase. Utilizing diuretic protocols set by physician.;Long term: Adoption of self-care skills and reduction of barriers for early signs and symptoms recognition and intervention leading to self-care maintenance.    Hypertension  Yes    Intervention  Provide education on lifestyle modifcations including regular physical activity/exercise, weight management, moderate sodium restriction and increased consumption of fresh fruit, vegetables, and low fat dairy, alcohol moderation, and smoking cessation.;Monitor prescription use compliance.    Expected Outcomes  Short Term: Continued assessment and intervention until BP is < 140/911mHG in hypertensive participants. < 130/8063mG in hypertensive participants with diabetes, heart failure or chronic kidney disease.;Long Term: Maintenance of blood pressure at goal levels.    Lipids  Yes    Intervention  Provide education and support for participant on nutrition & aerobic/resistive exercise along with prescribed medications to achieve LDL <17m40mDL >40mg8m Expected Outcomes  Short Term: Participant states understanding of desired cholesterol values and is compliant with medications prescribed. Participant is following exercise prescription and nutrition guidelines.;Long Term: Cholesterol controlled with medications as prescribed, with individualized exercise RX and with personalized nutrition plan. Value goals: LDL < 17mg,6m > 40 mg.       Core Components/Risk Factors/Patient Goals Review:    Core Components/Risk Factors/Patient Goals at Discharge (Final Review):    ITP Comments: ITP Comments    Row Name 11/24/17 1231 11/24/17 1519 12/07/17 0623       ITP Comments  Kayton does not want to be on any medications and has stopped taking her medications.  Her heart rate was elevated today since she hasn't taken her .  We talked about her needing  to take medications for her safety and will required to take them to attend rehab. She promises to take her heart meds and diabetes.   Medical evaluation completed.  [facility]  Initial ITP created and sent for review to Dr. Mark MEmily Filbertcal Director of Cardiac Rehab.   30 day review completed. ITP sent to Dr. Mark MEmily Filbertcal Director of Cardiac Rehab. Continue with ITP unless changes are made by physician  New to program        Comments:

## 2017-12-08 ENCOUNTER — Encounter: Payer: Self-pay | Admitting: General Surgery

## 2017-12-08 ENCOUNTER — Encounter: Payer: Self-pay | Admitting: Family Medicine

## 2017-12-08 ENCOUNTER — Ambulatory Visit (INDEPENDENT_AMBULATORY_CARE_PROVIDER_SITE_OTHER): Payer: Medicare Other | Admitting: Family Medicine

## 2017-12-08 ENCOUNTER — Ambulatory Visit (INDEPENDENT_AMBULATORY_CARE_PROVIDER_SITE_OTHER): Payer: Medicaid Other | Admitting: General Surgery

## 2017-12-08 VITALS — BP 134/74 | HR 73 | Temp 98.0°F | Resp 16 | Ht 62.0 in | Wt 173.0 lb

## 2017-12-08 VITALS — BP 132/80 | HR 70 | Resp 16 | Ht 62.0 in | Wt 172.0 lb

## 2017-12-08 DIAGNOSIS — I25708 Atherosclerosis of coronary artery bypass graft(s), unspecified, with other forms of angina pectoris: Secondary | ICD-10-CM | POA: Diagnosis not present

## 2017-12-08 DIAGNOSIS — E782 Mixed hyperlipidemia: Secondary | ICD-10-CM | POA: Diagnosis not present

## 2017-12-08 DIAGNOSIS — I10 Essential (primary) hypertension: Secondary | ICD-10-CM

## 2017-12-08 DIAGNOSIS — E1159 Type 2 diabetes mellitus with other circulatory complications: Secondary | ICD-10-CM | POA: Diagnosis not present

## 2017-12-08 DIAGNOSIS — R1013 Epigastric pain: Secondary | ICD-10-CM

## 2017-12-08 DIAGNOSIS — Z23 Encounter for immunization: Secondary | ICD-10-CM

## 2017-12-08 DIAGNOSIS — Z951 Presence of aortocoronary bypass graft: Secondary | ICD-10-CM

## 2017-12-08 DIAGNOSIS — F418 Other specified anxiety disorders: Secondary | ICD-10-CM

## 2017-12-08 NOTE — Progress Notes (Signed)
Patient ID: Candace [name], female   DOB: [birth year], 67 y.o.   MRN: [id number]  Chief Complaint  Patient presents with  . Follow-up    HPI Candace [name] is a 67 y.o. female here today to discuss her ct scan done on 11/24/2017. Patient states her epigastric pain is much better.   Daughter, Clarise Cruz is present at visit.   HPI  Past Medical History:  Diagnosis Date  . Allergy   . Anemia   . Anxiety   . Arrhythmia   . Arthritis   . Coronary artery disease   . Depression   . Diabetes mellitus without complication ([place])   . Dyspnea    doe  . Dysrhythmia   . GERD (gastroesophageal reflux disease)   . Headache   . History of hiatal hernia   . Hyperlipidemia   . Hypertension   . Myocardial infarction ([place])    2016, 04/2017  . Myocardial infarction with cardiac rehabilitation [facility])    MI 2016/ CABG 8/17    FINISHED CARDIAC REHAB 3 WEEKS AGO  . Panic attack   . Pneumonia   . Reflux   . Stroke [facility]) 2015   showed up on MRI; no weakness noted  . TIA (transient ischemic attack)   . Voice tremor     Past Surgical History:  Procedure Laterality Date  . ABDOMINAL HYSTERECTOMY    . APPENDECTOMY  1975  . ARTERY BIOPSY Right 04/26/2016   Procedure: BIOPSY TEMPORAL ARTERY;  Surgeon: Margaretha Sheffield, MD;  Location: [facility] ORS;  Service: ENT;  Laterality: Right;  . CARDIAC CATHETERIZATION N/A 11/06/2015   Procedure: Left Heart Cath and Coronary Angiography;  Surgeon: Corey Skains, MD;  Location: [place] CV LAB;  Service: Cardiovascular;  Laterality: N/A;  . CESAREAN SECTION    . COLONOSCOPY  2015  . CORONARY ANGIOPLASTY  04/2017   [place]  . CORONARY ARTERY BYPASS GRAFT N/A 11/10/2015   Procedure: CORONARY ARTERY BYPASS GRAFTING (CABG), ON PUMP, TIMES FOUR, USING LEFT INTERNAL MAMMARY ARTERY, BILATERAL GREATER SAPHENOUS VEINS HARVESTED ENDOSCOPICALLY;  Surgeon: [name], MD;  Location: [place];  Service: Open Heart Surgery;  Laterality: N/A;  LIMA-LAD; SEQ  SVG-OM1-OM2; SVG-PL  . CORONARY STENT INTERVENTION N/A 08/05/2016   Procedure: Coronary Stent Intervention;  Surgeon: Isaias Cowman, MD;  Location: [place] CV LAB;  Service: Cardiovascular;  Laterality: N/A;  . HYSTERECTOMY ABDOMINAL WITH SALPINGO-OOPHORECTOMY Bilateral 08/15/2017   Procedure: HYSTERECTOMY ABDOMINAL WITH BILATERAL SALPINGO-OOPHORECTOMY;  Surgeon: Rubie Maid, MD;  Location: [facility] ORS;  Service: Gynecology;  Laterality: Bilateral;  . LEFT HEART CATH AND CORONARY ANGIOGRAPHY N/A 08/05/2016   Procedure: Left Heart Cath and Coronary Angiography;  Surgeon: Isaias Cowman, MD;  Location: [place] CV LAB;  Service: Cardiovascular;  Laterality: N/A;  . TEE WITHOUT CARDIOVERSION N/A 11/10/2015   Procedure: TRANSESOPHAGEAL ECHOCARDIOGRAM (TEE);  Surgeon: [name], MD;  Location: [place];  Service: Open Heart Surgery;  Laterality: N/A;  . TUBAL LIGATION      Family History  Problem Relation Age of Onset  . Cancer Father   . Hypertension Father   . Heart disease Father   . Asthma Father   . Cancer Mother   . Hypertension Mother   . Pancreatic cancer Mother 90  . Cancer Sister   . Breast cancer Sister 83  . Breast cancer Sister 61  . Lung cancer Brother   . Pancreatic cancer Sister 76  . Cancer Sister     Social  History Social History   Tobacco Use  . Smoking status: Former Smoker    Packs/day: 0.50    Types: Cigarettes    Last attempt to quit: 10/07/2001    Years since quitting: 16.1  . Smokeless tobacco: Never Used  Substance Use Topics  . Alcohol use: No    Alcohol/week: 0.0 standard drinks  . Drug use: No    Allergies  Allergen Reactions  . Lisinopril     cough  . Penicillins Swelling, Rash and Other (See Comments)    Has patient had a PCN reaction causing immediate rash, facial/tongue/throat swelling, SOB or lightheadedness with hypotension: Yes Has patient had a PCN reaction causing severe rash involving mucus membranes or skin  necrosis: Yes Has patient had a PCN reaction that required hospitalization No Has patient had a PCN reaction occurring within the last 10 years: Yes If all of the above answers are "NO", then may proceed with Cephalosporin use.     Current Outpatient Medications  Medication Sig Dispense Refill  . acetaminophen (TYLENOL) 500 MG tablet Take 2 tablets (1,000 mg total) by mouth every 8 (eight) hours as needed. 60 tablet 1  . clopidogrel (PLAVIX) 75 MG tablet Take 75 mg by mouth daily.    . Coenzyme Q10 (CO Q 10) 100 MG CAPS Take 100 mg by mouth daily. 30 capsule 12  . [employer] ASPIRIN ADULT LOW DOSE 81 MG chewable tablet CHEW 1 TABLET (81 MG TOTAL) BY MOUTH DAILY. 90 tablet 3  . cyproheptadine (PERIACTIN) 4 MG tablet Take 4 mg by mouth 3 (three) times daily as needed for allergies.    . furosemide (LASIX) 20 MG tablet Take 20 mg by mouth daily.    . isosorbide mononitrate (IMDUR) 30 MG 24 hr tablet Take 2 tablets (60 mg total) by mouth daily. 60 tablet 12  . isosorbide mononitrate (IMDUR) 30 MG 24 hr tablet TAKE 1 TABLET (30 MG TOTAL) BY MOUTH 2 (TWO) TIMES DAILY. 180 tablet 4  . loperamide (IMODIUM) 2 MG capsule Take by mouth as needed for diarrhea or loose stools.    . metFORMIN (GLUCOPHAGE) 1000 MG tablet Take 1 tablet (1,000 mg total) by mouth 2 (two) times daily with a meal. 180 tablet 3  . metoprolol tartrate (LOPRESSOR) 50 MG tablet TAKE 1 TABLET BY MOUTH TWICE A DAY FOR HIGH BLOOD PRESSURE 60 tablet 12  . nitroGLYCERIN (NITROSTAT) 0.4 MG SL tablet Place 1 tablet (0.4 mg total) under the tongue every 5 (five) minutes as needed for chest pain. 30 tablet 0  . omeprazole (PRILOSEC) 20 MG capsule Take 1 capsule (20 mg total) by mouth daily. 30 capsule 3  . ondansetron (ZOFRAN-ODT) 8 MG disintegrating tablet DISSOLVE 1 TABLET ON THE TONGUE EVERY 8 HOURS AS NEEDED FOR NAUSEA OR VOMITING 30 tablet 1  . rosuvastatin (CRESTOR) 10 MG tablet Take 1 tablet (10 mg total) by mouth daily. 90 tablet 3  .  sucralfate (CARAFATE) 1 g tablet Take 1 tablet (1 g total) by mouth 2 (two) times daily. 60 tablet 5  . tiZANidine (ZANAFLEX) 2 MG tablet Take 2 mg by mouth at bedtime.      No current facility-administered medications for this visit.     Review of Systems Review of Systems  Constitutional: Negative.   Respiratory: Negative.   Cardiovascular: Negative.     Blood pressure 132/80, pulse 70, resp. rate 16, height 5\' 2"  (1.575 m), weight 172 lb (78 kg).  Physical Exam Physical Exam  Constitutional: She  is oriented to person, place, and time. She appears well-developed and well-nourished.  Neurological: She is alert and oriented to person, place, and time.  Skin: Skin is warm and dry.  Psychiatric: Her behavior is normal.    Data Reviewed CT of the abdomen and pelvis dated November 24, 2017 was reviewed:   Lower chest: Small hiatal hernia noted. No acute abnormalities identified.  Hepatobiliary: No focal liver abnormality. The gallbladder appears within normal limits. No stones or biliary ductal dilatation identified.  Pancreas: Unremarkable. No pancreatic ductal dilatation or surrounding inflammatory changes.  Spleen: Normal in size without focal abnormality.  Adrenals/Urinary Tract: Normal appearance of the adrenal glands. The kidneys are unremarkable. No mass or hydronephrosis.  Stomach/Bowel: Small hiatal hernia. The stomach is otherwise unremarkable. The visualized portions of the bowel loops within the abdomen are unremarkable.  Vascular/Lymphatic: Aortic atherosclerosis. No enlarged abdominal or pelvic lymph nodes.  Other: Small fat containing periumbilical hernia identified.  Musculoskeletal: Spondylosis identified within the visualized portions of the thoracic and lumbar spine.  Assessment    Solution of previous epigastric pain, nausea and vomiting.  No evidence of cholelithiasis or pancreatic disease.    Plan    The patient is aware to call  back for any questions or new concerns. Follow up as needed or sooner if symptoms return.    HPI, Physical Exam, Assessment and Plan have been scribed under the direction and in the presence of Robert Bellow, MD. Karie Fetch, RN  I have completed the exam and reviewed the above documentation for accuracy and completeness.  I agree with the above.  Haematologist has been used and any errors in dictation or transcription are unintentional.  Hervey Ard, M.D., F.A.C.S.  Forest Gleason Henery Betzold 12/09/2017, 2:58 PM

## 2017-12-08 NOTE — Patient Instructions (Addendum)
The patient is aware to call back for any questions or new concerns. Follow up as needed or sooner if symptoms return.

## 2017-12-08 NOTE — Progress Notes (Signed)
Patient: Candace [name] Female    DOB: Dec 17, 1950   67 y.o.   MRN: [id number] Visit Date: 12/08/2017  Today's Provider: [name], MD   No chief complaint on file.  Subjective:    HPI  MDD/GAD From 11/09/2017-Major issue--better today. RTC 1 month. She says she is feeling much better !  Allergies  Allergen Reactions  . Lisinopril     cough  . Penicillins Swelling, Rash and Other (See Comments)    Has patient had a PCN reaction causing immediate rash, facial/tongue/throat swelling, SOB or lightheadedness with hypotension: Yes Has patient had a PCN reaction causing severe rash involving mucus membranes or skin necrosis: Yes Has patient had a PCN reaction that required hospitalization No Has patient had a PCN reaction occurring within the last 10 years: Yes If all of the above answers are "NO", then may proceed with Cephalosporin use.      Current Outpatient Medications:  .  acetaminophen (TYLENOL) 500 MG tablet, Take 2 tablets (1,000 mg total) by mouth every 8 (eight) hours as needed., Disp: 60 tablet, Rfl: 1 .  albuterol (PROVENTIL HFA;VENTOLIN HFA) 108 (90 Base) MCG/ACT inhaler, Inhale 2 puffs into the lungs every 6 (six) hours as needed for wheezing or shortness of breath. (Patient not taking: Reported on 11/24/2017), Disp: 1 Inhaler, Rfl: 2 .  clopidogrel (PLAVIX) 75 MG tablet, Take 75 mg by mouth daily., Disp: , Rfl:  .  Coenzyme Q10 (CO Q 10) 100 MG CAPS, Take 100 mg by mouth daily. (Patient not taking: Reported on 11/24/2017), Disp: 30 capsule, Rfl: 12 .  [facility] ASPIRIN ADULT LOW DOSE 81 MG chewable tablet, CHEW 1 TABLET (81 MG TOTAL) BY MOUTH DAILY. (Patient not taking: Reported on 11/24/2017), Disp: 90 tablet, Rfl: 3 .  cyproheptadine (PERIACTIN) 4 MG tablet, Take 4 mg by mouth 3 (three) times daily as needed for allergies., Disp: , Rfl:  .  furosemide (LASIX) 20 MG tablet, Take 20 mg by mouth daily., Disp: , Rfl:  .  isosorbide mononitrate (IMDUR) 30 MG 24 hr  tablet, Take 2 tablets (60 mg total) by mouth daily. (Patient not taking: Reported on 11/24/2017), Disp: 60 tablet, Rfl: 12 .  isosorbide mononitrate (IMDUR) 30 MG 24 hr tablet, TAKE 1 TABLET (30 MG TOTAL) BY MOUTH 2 (TWO) TIMES DAILY., Disp: 180 tablet, Rfl: 4 .  loperamide (IMODIUM) 2 MG capsule, Take by mouth as needed for diarrhea or loose stools., Disp: , Rfl:  .  metFORMIN (GLUCOPHAGE) 1000 MG tablet, Take 1 tablet (1,000 mg total) by mouth 2 (two) times daily with a meal. (Patient not taking: Reported on 11/24/2017), Disp: 180 tablet, Rfl: 3 .  metoprolol tartrate (LOPRESSOR) 50 MG tablet, TAKE 1 TABLET BY MOUTH TWICE A DAY FOR HIGH BLOOD PRESSURE (Patient not taking: Reported on 11/24/2017), Disp: 60 tablet, Rfl: 12 .  nitroGLYCERIN (NITROSTAT) 0.4 MG SL tablet, Place 1 tablet (0.4 mg total) under the tongue every 5 (five) minutes as needed for chest pain. (Patient not taking: Reported on 11/24/2017), Disp: 30 tablet, Rfl: 0 .  omeprazole (PRILOSEC) 20 MG capsule, Take 1 capsule (20 mg total) by mouth daily. (Patient not taking: Reported on 11/24/2017), Disp: 30 capsule, Rfl: 3 .  ondansetron (ZOFRAN-ODT) 8 MG disintegrating tablet, DISSOLVE 1 TABLET ON THE TONGUE EVERY 8 HOURS AS NEEDED FOR NAUSEA OR VOMITING (Patient not taking: Reported on 11/24/2017), Disp: 30 tablet, Rfl: 1 .  rosuvastatin (CRESTOR) 10 MG tablet, Take 1 tablet (  10 mg total) by mouth daily. (Patient not taking: Reported on 11/24/2017), Disp: 90 tablet, Rfl: 3 .  sucralfate (CARAFATE) 1 g tablet, Take 1 tablet (1 g total) by mouth 2 (two) times daily. (Patient not taking: Reported on 11/24/2017), Disp: 60 tablet, Rfl: 5 .  tiZANidine (ZANAFLEX) 2 MG tablet, Take 2 mg by mouth at bedtime. , Disp: , Rfl:   Review of Systems  Constitutional: Negative for appetite change, chills, fatigue and fever.  Eyes: Negative.   Respiratory: Negative for chest tightness and shortness of breath.   Cardiovascular: Negative for chest pain and  palpitations.  Gastrointestinal: Negative for abdominal pain, nausea and vomiting.  Endocrine: Negative.   Allergic/Immunologic: Negative.   Neurological: Negative for dizziness and weakness.  Psychiatric/Behavioral: Negative.     Social History   Tobacco Use  . Smoking status: Former Smoker    Packs/day: 0.50    Types: Cigarettes    Last attempt to quit: 10/07/2001    Years since quitting: 16.1  . Smokeless tobacco: Never Used  Substance Use Topics  . Alcohol use: No    Alcohol/week: 0.0 standard drinks   Objective:   There were no vitals taken for this visit. There were no vitals filed for this visit.   Physical Exam  Constitutional: She is oriented to person, place, and time. She appears well-developed and well-nourished.  HENT:  Head: Normocephalic and atraumatic.  Eyes: Conjunctivae are normal. No scleral icterus.  Neck: No thyromegaly present.  Cardiovascular: Normal rate, regular rhythm and normal heart sounds.  Pulmonary/Chest: Effort normal and breath sounds normal.  Abdominal: Soft.  Musculoskeletal: She exhibits no edema.  Neurological: She is alert and oriented to person, place, and time.  Skin: Skin is warm and dry.  Psychiatric: She has a normal mood and affect. Her behavior is normal. Judgment and thought content normal.  Her mood and attitude have been much more positive the past 2 visits,        Assessment & Plan:     1. Need for influenza vaccination  - Flu vaccine HIGH DOSE PF  2. Essential (primary) hypertension  - Comprehensive metabolic panel  3. Type 2 diabetes mellitus with other circulatory complication, without long-term current use of insulin (HCC) RTC 3 months. - Hemoglobin A1c - Comprehensive metabolic panel  4. Mixed hyperlipidemia  - Comprehensive metabolic panel - Lipid Panel With LDL/HDL Ratio 5.s/p CABG 6.Depression/Anxiety Seemingly improved. Pt told she can resume short driving during the day.     I have done the  exam and reviewed the above chart and it is accurate to the best of my knowledge. Development worker, community has been used in this note in any air is in the dictation or transcription are unintentional.  [name], MD  [place]

## 2017-12-09 ENCOUNTER — Encounter: Payer: Self-pay | Admitting: *Deleted

## 2017-12-09 ENCOUNTER — Telehealth: Payer: Self-pay | Admitting: *Deleted

## 2017-12-09 NOTE — Telephone Encounter (Signed)
Candace [name] left message to call her back.  She is very overwhelmed with her limitations. She would like to return to work and graduate early.  She would like to add in a third day. She is trying to work with her kids to get here.  She has been cleared to drive to store but feels this is such a limitation.  She got very emotional on the phone and wants to know what she can do to get to the doctor to clear her to drive sooner.  She is doing what she can but is very frustrated by everything.  She was saying she is very upset with her doctor as well as her daughter.  We will help her look into alternative means of transportation as well.

## 2017-12-13 DIAGNOSIS — I25118 Atherosclerotic heart disease of native coronary artery with other forms of angina pectoris: Secondary | ICD-10-CM | POA: Diagnosis not present

## 2017-12-13 DIAGNOSIS — I208 Other forms of angina pectoris: Secondary | ICD-10-CM

## 2017-12-13 NOTE — Progress Notes (Signed)
Daily Session Note  Patient Details  Name: Candace [name] MRN: [id number] Date of Birth: 1950/07/24 Referring Provider:     Cardiac Rehab from 11/24/2017 in [facility] Cardiac and Pulmonary Rehab  Referring Provider  [name]      Encounter Date: 12/13/2017  Check In: Session Check In - 12/13/17 0818      Check-In   Supervising physician immediately available to respond to emergencies  See telemetry face sheet for immediately available ER MD    Location  [facility]-Cardiac & Pulmonary Rehab    Staff Present  Alberteen Sam, MA, RCEP, CCRP, Exercise Physiologist;Amanda Oletta Darter, BA, ACSM CEP, Exercise Physiologist;Krista Frederico Hamman, RN BSN    Medication changes reported      No    Fall or balance concerns reported     No    Warm-up and Cool-down  Performed on first and last piece of equipment    Resistance Training Performed  Yes    VAD Patient?  No    PAD/SET Patient?  No      Pain Assessment   Currently in Pain?  No/denies          Social History   Tobacco Use  Smoking Status Former Smoker  . Packs/day: 0.50  . Types: Cigarettes  . Last attempt to quit: 10/07/2001  . Years since quitting: 16.1  Smokeless Tobacco Never Used    Goals Met:  Exercise tolerated well No report of cardiac concerns or symptoms Strength training completed today  Goals Unmet:  Not Applicable  Comments: Pt able to follow exercise prescription today without complaint.  Will continue to monitor for progression. Reviewed home exercise with pt today.  Pt plans to walk at home for exercise.  Reviewed THR, pulse, RPE, sign and symptoms, NTG use, and when to call 911 or MD.  Also discussed weather considerations and indoor options.  Pt voiced understanding.    Dr. Emily Filbert is Medical Director for [place] and [facility].

## 2017-12-15 ENCOUNTER — Encounter: Payer: Medicare Other | Admitting: *Deleted

## 2017-12-15 DIAGNOSIS — I208 Other forms of angina pectoris: Secondary | ICD-10-CM

## 2017-12-15 DIAGNOSIS — I25118 Atherosclerotic heart disease of native coronary artery with other forms of angina pectoris: Secondary | ICD-10-CM | POA: Diagnosis not present

## 2017-12-15 LAB — GLUCOSE, CAPILLARY: Glucose-Capillary: 163 mg/dL — ABNORMAL HIGH (ref 70–99)

## 2017-12-15 NOTE — Progress Notes (Signed)
Daily Session Note  Patient Details  Name: Candace [name] MRN: [id number] Date of Birth: 1950/10/11 Referring Provider:     Cardiac Rehab from 11/24/2017 in [facility] Cardiac and Pulmonary Rehab  Referring Provider  [name]      Encounter Date: 12/15/2017  Check In: Session Check In - 12/15/17 0759      Check-In   Supervising physician immediately available to respond to emergencies  See telemetry face sheet for immediately available ER MD    Location  [facility]-Cardiac & Pulmonary Rehab    Staff Present  Alberteen Sam, MA, RCEP, CCRP, Exercise Physiologist;Amanda Oletta Darter, BA, ACSM CEP, Exercise Physiologist;Carroll Enterkin, RN, BSN    Medication changes reported      No    Fall or balance concerns reported     No    Warm-up and Cool-down  Performed on first and last piece of equipment    Resistance Training Performed  Yes    VAD Patient?  No    PAD/SET Patient?  No      Pain Assessment   Currently in Pain?  No/denies          Social History   Tobacco Use  Smoking Status Former Smoker  . Packs/day: 0.50  . Types: Cigarettes  . Last attempt to quit: 10/07/2001  . Years since quitting: 16.2  Smokeless Tobacco Never Used    Goals Met:  Independence with exercise equipment Exercise tolerated well No report of cardiac concerns or symptoms Strength training completed today  Goals Unmet:  Not Applicable  Comments: Pt able to follow exercise prescription today without complaint.  Will continue to monitor for progression.    Dr. Emily Filbert is Medical Director for [place] and [facility].

## 2017-12-20 ENCOUNTER — Encounter: Payer: Medicare Other | Admitting: *Deleted

## 2017-12-20 DIAGNOSIS — I208 Other forms of angina pectoris: Secondary | ICD-10-CM

## 2017-12-20 DIAGNOSIS — I25118 Atherosclerotic heart disease of native coronary artery with other forms of angina pectoris: Secondary | ICD-10-CM | POA: Diagnosis not present

## 2017-12-20 LAB — GLUCOSE, CAPILLARY
Glucose-Capillary: 116 mg/dL — ABNORMAL HIGH (ref 70–99)
Glucose-Capillary: 141 mg/dL — ABNORMAL HIGH (ref 70–99)

## 2017-12-20 NOTE — Progress Notes (Signed)
Daily Session Note  Patient Details  Name: Candace [name] MRN: [id number] Date of Birth: 01-26-1951 Referring Provider:     Cardiac Rehab from 11/24/2017 in [facility] Cardiac and Pulmonary Rehab  Referring Provider  [name]      Encounter Date: 12/20/2017  Check In: Session Check In - 12/20/17 0754      Check-In   Supervising physician immediately available to respond to emergencies  See telemetry face sheet for immediately available ER MD    Location  [facility]-Cardiac & Pulmonary Rehab    Staff Present  Carson Myrtle, BS, RRT, Respiratory Lennie Hummer, MA, RCEP, CCRP, Exercise Physiologist;Amanda Oletta Darter, BA, ACSM CEP, Exercise Physiologist;Susanne Bice, RN, BSN, CCRP    Medication changes reported      No    Fall or balance concerns reported     No    Warm-up and Cool-down  Performed on first and last piece of equipment    Resistance Training Performed  Yes    VAD Patient?  No    PAD/SET Patient?  No      Pain Assessment   Currently in Pain?  No/denies          Social History   Tobacco Use  Smoking Status Former Smoker  . Packs/day: 0.50  . Types: Cigarettes  . Last attempt to quit: 10/07/2001  . Years since quitting: 16.2  Smokeless Tobacco Never Used    Goals Met:  Independence with exercise equipment Exercise tolerated well Personal goals reviewed No report of cardiac concerns or symptoms Strength training completed today  Goals Unmet:  Not Applicable  Comments: Pt able to follow exercise prescription today without complaint.  Will continue to monitor for progression.    Dr. Emily Filbert is Medical Director for [place] and [facility].

## 2017-12-21 ENCOUNTER — Other Ambulatory Visit: Payer: Self-pay | Admitting: Family Medicine

## 2017-12-21 DIAGNOSIS — Z1231 Encounter for screening mammogram for malignant neoplasm of breast: Secondary | ICD-10-CM

## 2017-12-22 ENCOUNTER — Encounter: Payer: Medicare Other | Admitting: *Deleted

## 2017-12-27 ENCOUNTER — Encounter: Payer: Medicare Other | Attending: Internal Medicine | Admitting: *Deleted

## 2017-12-27 DIAGNOSIS — Z79899 Other long term (current) drug therapy: Secondary | ICD-10-CM | POA: Diagnosis not present

## 2017-12-27 DIAGNOSIS — I208 Other forms of angina pectoris: Secondary | ICD-10-CM | POA: Diagnosis present

## 2017-12-27 DIAGNOSIS — I10 Essential (primary) hypertension: Secondary | ICD-10-CM | POA: Diagnosis not present

## 2017-12-27 DIAGNOSIS — K219 Gastro-esophageal reflux disease without esophagitis: Secondary | ICD-10-CM | POA: Insufficient documentation

## 2017-12-27 DIAGNOSIS — Z8673 Personal history of transient ischemic attack (TIA), and cerebral infarction without residual deficits: Secondary | ICD-10-CM | POA: Insufficient documentation

## 2017-12-27 DIAGNOSIS — I252 Old myocardial infarction: Secondary | ICD-10-CM | POA: Diagnosis not present

## 2017-12-27 DIAGNOSIS — I25118 Atherosclerotic heart disease of native coronary artery with other forms of angina pectoris: Secondary | ICD-10-CM | POA: Insufficient documentation

## 2017-12-27 DIAGNOSIS — D649 Anemia, unspecified: Secondary | ICD-10-CM | POA: Diagnosis not present

## 2017-12-27 DIAGNOSIS — Z7984 Long term (current) use of oral hypoglycemic drugs: Secondary | ICD-10-CM | POA: Diagnosis not present

## 2017-12-27 DIAGNOSIS — E119 Type 2 diabetes mellitus without complications: Secondary | ICD-10-CM | POA: Insufficient documentation

## 2017-12-27 DIAGNOSIS — E785 Hyperlipidemia, unspecified: Secondary | ICD-10-CM | POA: Diagnosis not present

## 2017-12-27 DIAGNOSIS — Z7902 Long term (current) use of antithrombotics/antiplatelets: Secondary | ICD-10-CM | POA: Insufficient documentation

## 2017-12-27 DIAGNOSIS — F329 Major depressive disorder, single episode, unspecified: Secondary | ICD-10-CM | POA: Insufficient documentation

## 2017-12-27 DIAGNOSIS — Z87891 Personal history of nicotine dependence: Secondary | ICD-10-CM | POA: Insufficient documentation

## 2017-12-27 DIAGNOSIS — F419 Anxiety disorder, unspecified: Secondary | ICD-10-CM | POA: Diagnosis not present

## 2017-12-27 NOTE — Progress Notes (Signed)
Opened in error

## 2017-12-27 NOTE — Progress Notes (Signed)
Daily Session Note  Patient Details  Name: Candace [name] MRN: [id number] Date of Birth: 1950/11/24 Referring Provider:     Cardiac Rehab from 11/24/2017 in [facility] Cardiac and Pulmonary Rehab  Referring Provider  [name]      Encounter Date: 12/27/2017  Check In: Session Check In - 12/27/17 0827      Check-In   Supervising physician immediately available to respond to emergencies  See telemetry face sheet for immediately available ER MD    Location  [facility]-Cardiac & Pulmonary Rehab    Staff Present  Nyoka Cowden, RN, BSN, [place], BS, RRT, Respiratory Lennie Hummer, MA, RCEP, CCRP, Exercise Physiologist;Amanda Oletta Darter, [place], ACSM CEP, Exercise Physiologist    Medication changes reported      No    Fall or balance concerns reported     No    Warm-up and Cool-down  Performed on first and last piece of equipment    Resistance Training Performed  Yes    VAD Patient?  No    PAD/SET Patient?  No      Pain Assessment   Currently in Pain?  No/denies          Social History   Tobacco Use  Smoking Status Former Smoker  . Packs/day: 0.50  . Types: Cigarettes  . Last attempt to quit: 10/07/2001  . Years since quitting: 16.2  Smokeless Tobacco Never Used    Goals Met:  Independence with exercise equipment Exercise tolerated well No report of cardiac concerns or symptoms Strength training completed today  Goals Unmet:  Not Applicable  Comments: Pt able to follow exercise prescription today without complaint.  Will continue to monitor for progression.    Dr. Emily Filbert is Medical Director for [place] and [facility].

## 2017-12-28 DIAGNOSIS — I208 Other forms of angina pectoris: Secondary | ICD-10-CM

## 2017-12-28 DIAGNOSIS — I25118 Atherosclerotic heart disease of native coronary artery with other forms of angina pectoris: Secondary | ICD-10-CM | POA: Diagnosis not present

## 2017-12-28 NOTE — Progress Notes (Signed)
Daily Session Note  Patient Details  Name: Candace [name] MRN: [id number] Date of Birth: July 16, 1950 Referring Provider:     Cardiac Rehab from 11/24/2017 in [facility] Cardiac and Pulmonary Rehab  Referring Provider  [name]      Encounter Date: 12/28/2017  Check In: Session Check In - 12/28/17 0826      Check-In   Supervising physician immediately available to respond to emergencies  See telemetry face sheet for immediately available ER MD    Location  [facility]-Cardiac & Pulmonary Rehab    Staff Present  Justin Mend Lorre Nick, MA, RCEP, CCRP, Exercise Physiologist;Susanne Bice, RN, BSN, CCRP    Medication changes reported      No    Fall or balance concerns reported     No    Warm-up and Cool-down  Performed on first and last piece of equipment    Resistance Training Performed  Yes    VAD Patient?  No    PAD/SET Patient?  No      Pain Assessment   Currently in Pain?  No/denies          Social History   Tobacco Use  Smoking Status Former Smoker  . Packs/day: 0.50  . Types: Cigarettes  . Last attempt to quit: 10/07/2001  . Years since quitting: 16.2  Smokeless Tobacco Never Used    Goals Met:  Independence with exercise equipment Exercise tolerated well No report of cardiac concerns or symptoms Strength training completed today  Goals Unmet:  Not Applicable  Comments: Pt able to follow exercise prescription today without complaint.  Will continue to monitor for progression.    Dr. Emily Filbert is Medical Director for [place] and [facility].

## 2017-12-29 ENCOUNTER — Other Ambulatory Visit: Payer: Self-pay | Admitting: Family Medicine

## 2017-12-29 DIAGNOSIS — R1010 Upper abdominal pain, unspecified: Secondary | ICD-10-CM

## 2017-12-30 ENCOUNTER — Ambulatory Visit
Admission: RE | Admit: 2017-12-30 | Discharge: 2017-12-30 | Disposition: A | Discharge status: Home / Self Care | Payer: Medicare Other | Source: Ambulatory Visit | Attending: Family Medicine | Admitting: Family Medicine

## 2017-12-30 DIAGNOSIS — Z1231 Encounter for screening mammogram for malignant neoplasm of breast: Secondary | ICD-10-CM | POA: Diagnosis present

## 2018-01-03 DIAGNOSIS — Z951 Presence of aortocoronary bypass graft: Secondary | ICD-10-CM

## 2018-01-03 DIAGNOSIS — Z9861 Coronary angioplasty status: Secondary | ICD-10-CM

## 2018-01-03 DIAGNOSIS — I208 Other forms of angina pectoris: Secondary | ICD-10-CM

## 2018-01-03 DIAGNOSIS — I25118 Atherosclerotic heart disease of native coronary artery with other forms of angina pectoris: Secondary | ICD-10-CM | POA: Diagnosis not present

## 2018-01-03 LAB — GLUCOSE, CAPILLARY
Glucose-Capillary: 170 mg/dL — ABNORMAL HIGH (ref 70–99)
Glucose-Capillary: 139 mg/dL — ABNORMAL HIGH (ref 70–99)

## 2018-01-03 NOTE — Progress Notes (Signed)
Daily Session Note  Patient Details  Name: Candace [name] MRN: [id number] Date of Birth: 1950-10-22 Referring Provider:     Cardiac Rehab from 11/24/2017 in [facility] Cardiac and Pulmonary Rehab  Referring Provider  [name]      Encounter Date: 01/03/2018  Check In: Session Check In - 01/03/18 0804      Check-In   Supervising physician immediately available to respond to emergencies  See telemetry face sheet for immediately available ER MD    Location  [facility]-Cardiac & Pulmonary Rehab    Staff Present  Heath Lark, RN, BSN, CCRP;Amanda Sommer, BA, ACSM CEP, Exercise Physiologist;Jessica [place], MA, RCEP, CCRP, Exercise Physiologist    Medication changes reported      No    Fall or balance concerns reported     No    Warm-up and Cool-down  Performed on first and last piece of equipment    Resistance Training Performed  Yes    VAD Patient?  No    PAD/SET Patient?  No      Pain Assessment   Currently in Pain?  No/denies    Multiple Pain Sites  No          Social History   Tobacco Use  Smoking Status Former Smoker  . Packs/day: 0.50  . Types: Cigarettes  . Last attempt to quit: 10/07/2001  . Years since quitting: 16.2  Smokeless Tobacco Never Used    Goals Met:  Independence with exercise equipment Exercise tolerated well No report of cardiac concerns or symptoms Strength training completed today  Goals Unmet:  Not Applicable  Comments: Pt able to follow exercise prescription today without complaint.  Will continue to monitor for progression.    Dr. Emily Filbert is Medical Director for [place] and [facility].

## 2018-01-04 ENCOUNTER — Encounter: Payer: Self-pay | Admitting: *Deleted

## 2018-01-04 DIAGNOSIS — I25118 Atherosclerotic heart disease of native coronary artery with other forms of angina pectoris: Secondary | ICD-10-CM | POA: Diagnosis not present

## 2018-01-04 DIAGNOSIS — I208 Other forms of angina pectoris: Secondary | ICD-10-CM

## 2018-01-04 NOTE — Progress Notes (Signed)
Daily Session Note  Patient Details  Name: MAKHAYLA MCMURRY MRN: [id number] Date of Birth: [birth year] Referring Provider:     Cardiac Rehab from 11/24/2017 in [facility] Cardiac and Pulmonary Rehab  Referring Provider  [name]      Encounter Date: 01/04/2018  Check In: Session Check In - 01/04/18 0738      Check-In   Supervising physician immediately available to respond to emergencies  See telemetry face sheet for immediately available ER MD    Location  [facility]-Cardiac & Pulmonary Rehab    Staff Present  Heath Lark, RN, BSN, CCRP;Shermika Balthaser Darrin Nipper, [place], [place], [place], Exercise Physiologist    Medication changes reported      No    Fall or balance concerns reported     No    Warm-up and Cool-down  Performed on first and last piece of equipment    Resistance Training Performed  Yes    VAD Patient?  No    PAD/SET Patient?  No      Pain Assessment   Currently in Pain?  No/denies          Social History   Tobacco Use  Smoking Status Former Smoker  . Packs/day: 0.50  . Types: Cigarettes  . Last attempt to quit: 10/07/2001  . Years since quitting: 16.2  Smokeless Tobacco Never Used    Goals Met:  Independence with exercise equipment Exercise tolerated well No report of cardiac concerns or symptoms Strength training completed today  Goals Unmet:  Not Applicable  Comments: Pt able to follow exercise prescription today without complaint.  Will continue to monitor for progression.    Dr. Emily Filbert is Medical Director for [place] and [facility].

## 2018-01-04 NOTE — Progress Notes (Signed)
Cardiac Individual Treatment Plan  Patient Details  Name: Candace [name] MRN: [id number] Date of Birth: 09/21/1950 Referring Provider:     Cardiac Rehab from 11/24/2017 in [facility] Cardiac and Pulmonary Rehab  Referring Provider  [name]      Initial Encounter Date:    Cardiac Rehab from 11/24/2017 in [facility] Cardiac and Pulmonary Rehab  Date  11/24/17      Visit Diagnosis: Chronic stable angina ([place])  Patient's Home Medications on Admission:  Current Outpatient Medications:  .  acetaminophen (TYLENOL) 500 MG tablet, Take 2 tablets (1,000 mg total) by mouth every 8 (eight) hours as needed., Disp: 60 tablet, Rfl: 1 .  clopidogrel (PLAVIX) 75 MG tablet, Take 75 mg by mouth daily., Disp: , Rfl:  .  Coenzyme Q10 (CO Q 10) 100 MG CAPS, Take 100 mg by mouth daily., Disp: 30 capsule, Rfl: 12 .  [employer] ASPIRIN ADULT LOW DOSE 81 MG chewable tablet, CHEW 1 TABLET (81 MG TOTAL) BY MOUTH DAILY., Disp: 90 tablet, Rfl: 3 .  cyproheptadine (PERIACTIN) 4 MG tablet, Take 4 mg by mouth 3 (three) times daily as needed for allergies., Disp: , Rfl:  .  furosemide (LASIX) 20 MG tablet, Take 20 mg by mouth daily., Disp: , Rfl:  .  isosorbide mononitrate (IMDUR) 30 MG 24 hr tablet, Take 2 tablets (60 mg total) by mouth daily., Disp: 60 tablet, Rfl: 12 .  isosorbide mononitrate (IMDUR) 30 MG 24 hr tablet, TAKE 1 TABLET (30 MG TOTAL) BY MOUTH 2 (TWO) TIMES DAILY., Disp: 180 tablet, Rfl: 4 .  loperamide (IMODIUM) 2 MG capsule, Take by mouth as needed for diarrhea or loose stools., Disp: , Rfl:  .  metFORMIN (GLUCOPHAGE) 1000 MG tablet, Take 1 tablet (1,000 mg total) by mouth 2 (two) times daily with a meal., Disp: 180 tablet, Rfl: 3 .  metoprolol tartrate (LOPRESSOR) 50 MG tablet, TAKE 1 TABLET BY MOUTH TWICE A DAY FOR HIGH BLOOD PRESSURE, Disp: 60 tablet, Rfl: 12 .  nitroGLYCERIN (NITROSTAT) 0.4 MG SL tablet, Place 1 tablet (0.4 mg total) under the tongue every 5 (five) minutes as needed for chest pain., Disp:  30 tablet, Rfl: 0 .  omeprazole (PRILOSEC) 20 MG capsule, TAKE 1 CAPSULE BY MOUTH EVERY DAY, Disp: 90 capsule, Rfl: 1 .  ondansetron (ZOFRAN-ODT) 8 MG disintegrating tablet, DISSOLVE 1 TABLET ON THE TONGUE EVERY 8 HOURS AS NEEDED FOR NAUSEA OR VOMITING, Disp: 30 tablet, Rfl: 1 .  rosuvastatin (CRESTOR) 10 MG tablet, Take 1 tablet (10 mg total) by mouth daily., Disp: 90 tablet, Rfl: 3 .  sucralfate (CARAFATE) 1 g tablet, Take 1 tablet (1 g total) by mouth 2 (two) times daily., Disp: 60 tablet, Rfl: 5 .  tiZANidine (ZANAFLEX) 2 MG tablet, Take 2 mg by mouth at bedtime. , Disp: , Rfl:   Past Medical History: Past Medical History:  Diagnosis Date  . Allergy   . Anemia   . Anxiety   . Arrhythmia   . Arthritis   . Coronary artery disease   . Depression   . Diabetes mellitus without complication ([place])   . Dyspnea    doe  . Dysrhythmia   . GERD (gastroesophageal reflux disease)   . Headache   . History of hiatal hernia   . Hyperlipidemia   . Hypertension   . Myocardial infarction ([place])    2016, 04/2017  . Myocardial infarction with cardiac rehabilitation [facility])    MI 2016/ CABG 8/17    FINISHED CARDIAC  REHAB 3 WEEKS AGO  . Panic attack   . Pneumonia   . Reflux   . Stroke [facility]) 2015   showed up on MRI; no weakness noted  . TIA (transient ischemic attack)   . Voice tremor     Tobacco Use: Social History   Tobacco Use  Smoking Status Former Smoker  . Packs/day: 0.50  . Types: Cigarettes  . Last attempt to quit: 10/07/2001  . Years since quitting: 16.2  Smokeless Tobacco Never Used    Labs: Recent Chemical engineer    Labs for ITP Cardiac and Pulmonary Rehab Latest Ref Rng & Units 08/18/2016 10/01/2016 12/23/2016 03/24/2017 08/16/2017   Cholestrol 100 - 199 mg/dL - 259(H) - - -   LDLCALC 0 - 99 mg/dL - 173(H) - - -   HDL >39 mg/dL - 66 - - -   Trlycerides 0 - 149 mg/dL - 100 - - -   Hemoglobin A1c 4.8 - 5.6 % 8.1 6.9(H) 7.3(H) 7.8(H) 8.0(H)   PHART 7.350 - 7.450 - - - - -     PCO2ART 35.0 - 45.0 mmHg - - - - -   HCO3 20.0 - 24.0 mEq/L - - - - -   TCO2 0 - 100 mmol/L - - - - -   O2SAT % - - - - -       Exercise Target Goals: Exercise Program Goal: Individual exercise prescription set using results from initial 6 min walk test and THRR while considering  patient's activity barriers and safety.   Exercise Prescription Goal: Initial exercise prescription builds to 30-45 minutes a day of aerobic activity, 2-3 days per week.  Home exercise guidelines will be given to patient during program as part of exercise prescription that the participant will acknowledge.  Activity Barriers & Risk Stratification: Activity Barriers & Cardiac Risk Stratification - 11/24/17 1525      Activity Barriers & Cardiac Risk Stratification   Activity Barriers  Back Problems;Deconditioning;Shortness of Breath;Chest Pain/Angina;Decreased Ventricular Function;Muscular Weakness;Other (comment);Balance Concerns    Comments  Recent TIA, still having some headaches caused by compression in cervial spine, chest pain is improving (but has not been taking her Imdur)    Cardiac Risk Stratification  High       6 Minute Walk: 6 Minute Walk    Row Name 11/24/17 1524         6 Minute Walk   Phase  Initial     Distance  1142 feet     Walk Time  6 minutes     # of Rest Breaks  0     MPH  2.16     METS  3.5     RPE  9     Perceived Dyspnea   0     VO2 Peak  12.25     Symptoms  No     Resting HR  99 bpm     Resting BP  122/74     Resting Oxygen Saturation   95 %     Exercise Oxygen Saturation  during 6 min walk  98 %     Max Ex. HR  147 bpm     Max Ex. BP  174/88     2 Minute Post BP  134/84        Oxygen Initial Assessment:   Oxygen Re-Evaluation:   Oxygen Discharge (Final Oxygen Re-Evaluation):   Initial Exercise Prescription: Initial Exercise Prescription - 11/24/17 1500      Date  of Initial Exercise RX and Referring Provider   Date  11/24/17    Referring Provider   [name]      Treadmill   MPH  2.1    Grade  1    Minutes  15    METs  2.91      NuStep   Level  2    SPM  80    Minutes  15    METs  2.9      REL-XR   Level  2    Speed  50    Minutes  15    METs  2.9      Prescription Details   Frequency (times per week)  2    Duration  Progress to 30 minutes of continuous aerobic without signs/symptoms of physical distress      Intensity   THRR 40-80% of Max Heartrate  121-143    Ratings of Perceived Exertion  11-13    Perceived Dyspnea  0-4      Progression   Progression  Continue to progress workloads to maintain intensity without signs/symptoms of physical distress.      Resistance Training   Training Prescription  Yes    Weight  3 lbs    Reps  10-15       Perform Capillary Blood Glucose checks as needed.  Exercise Prescription Changes: Exercise Prescription Changes    Row Name 11/24/17 1500 12/12/17 1600 12/13/17 0800 12/26/17 1500       Response to Exercise   Blood Pressure (Admit)  122/74  116/74  -  128/70    Blood Pressure (Exercise)  174/88  136/64  -  124/64    Blood Pressure (Exit)  134/84  136/70  -  118/62    Heart Rate (Admit)  99 bpm  81 bpm  -  88 bpm    Heart Rate (Exercise)  147 bpm  124 bpm  -  113 bpm    Heart Rate (Exit)  103 bpm  84 bpm  -  90 bpm    Oxygen Saturation (Admit)  95 %  -  -  -    Oxygen Saturation (Exercise)  98 %  -  -  -    Rating of Perceived Exertion (Exercise)  9  13  -  13    Perceived Dyspnea (Exercise)  0  -  -  -    Symptoms  none  none  -  none    Comments  walk test results  first full day of exercise  -  -    Duration  Continue with 30 min of aerobic exercise without signs/symptoms of physical distress.  Continue with 30 min of aerobic exercise without signs/symptoms of physical distress.  -  Continue with 30 min of aerobic exercise without signs/symptoms of physical distress.    Intensity  -  THRR unchanged  -  THRR unchanged      Progression   Progression  -   Continue to progress workloads to maintain intensity without signs/symptoms of physical distress.  -  Continue to progress workloads to maintain intensity without signs/symptoms of physical distress.    Average METs  -  2.95  -  2.76      Resistance Training   Training Prescription  -  Yes  -  Yes    Weight  -  3 lbs  -  3 lbs    Reps  -  10-15  -  10-15      Interval Training   Interval Training  -  No  -  No      Treadmill   MPH  -  2.1  -  -    Grade  -  1  -  -    Minutes  -  15  -  -    METs  -  2.91  -  -      NuStep   Level  -  2  -  3    Minutes  -  15  -  15    METs  -  3  -  3      REL-XR   Level  -  -  -  2    Minutes  -  -  -  15    METs  -  -  -  2.3      Track   Laps  -  -  -  50    Minutes  -  -  -  15    METs  -  -  -  3      Home Exercise Plan   Plans to continue exercise at  -  -  Home (comment) walking  Home (comment) walking    Frequency  -  -  Add 3 additional days to program exercise sessions.  Add 3 additional days to program exercise sessions.    Initial Home Exercises Provided  -  -  12/13/17  12/13/17       Exercise Comments: Exercise Comments    Row Name 12/06/17 [phone]           Exercise Comments  First full day of exercise!  Patient was oriented to gym and equipment including functions, settings, policies, and procedures.  Patient's individual exercise prescription and treatment plan were reviewed.  All starting workloads were established based on the results of the 6 minute walk test done at initial orientation visit.  The plan for exercise progression was also introduced and progression will be customized based on patient's performance and goals.          Exercise Goals and Review: Exercise Goals    Row Name 11/24/17 1530             Exercise Goals   Increase Physical Activity  Yes       Intervention  Provide advice, education, support and counseling about physical activity/exercise needs.;Develop an individualized exercise  prescription for aerobic and resistive training based on initial evaluation findings, risk stratification, comorbidities and participant's personal goals.       Expected Outcomes  Short Term: Attend rehab on a regular basis to increase amount of physical activity.;Long Term: Add in home exercise to make exercise part of routine and to increase amount of physical activity.;Long Term: Exercising regularly at least 3-5 days a week.       Increase Strength and Stamina  Yes       Intervention  Provide advice, education, support and counseling about physical activity/exercise needs.;Develop an individualized exercise prescription for aerobic and resistive training based on initial evaluation findings, risk stratification, comorbidities and participant's personal goals.       Expected Outcomes  Short Term: Increase workloads from initial exercise prescription for resistance, speed, and METs.;Short Term: Perform resistance training exercises routinely during rehab and add in resistance training at home;Long Term: Improve cardiorespiratory fitness, muscular endurance and strength as measured by increased METs and functional  capacity (6MWT)       Able to understand and use rate of perceived exertion (RPE) scale  Yes       Intervention  Provide education and explanation on how to use RPE scale       Expected Outcomes  Short Term: Able to use RPE daily in rehab to express subjective intensity level;Long Term:  Able to use RPE to guide intensity level when exercising independently       Able to understand and use Dyspnea scale  Yes       Intervention  Provide education and explanation on how to use Dyspnea scale       Expected Outcomes  Short Term: Able to use Dyspnea scale daily in rehab to express subjective sense of shortness of breath during exertion;Long Term: Able to use Dyspnea scale to guide intensity level when exercising independently       Knowledge and understanding of Target Heart Rate Range (THRR)  Yes        Intervention  Provide education and explanation of THRR including how the numbers were predicted and where they are located for reference       Expected Outcomes  Short Term: Able to use daily as guideline for intensity in rehab;Short Term: Able to state/look up THRR;Long Term: Able to use THRR to govern intensity when exercising independently       Able to check pulse independently  Yes       Intervention  Provide education and demonstration on how to check pulse in carotid and radial arteries.;Review the importance of being able to check your own pulse for safety during independent exercise       Expected Outcomes  Short Term: Able to explain why pulse checking is important during independent exercise;Long Term: Able to check pulse independently and accurately       Understanding of Exercise Prescription  Yes       Intervention  Provide education, explanation, and written materials on patient's individual exercise prescription       Expected Outcomes  Short Term: Able to explain program exercise prescription;Long Term: Able to explain home exercise prescription to exercise independently          Exercise Goals Re-Evaluation : Exercise Goals Re-Evaluation    Row Name 12/06/17 0834 12/13/17 0819 12/20/17 0824 12/26/17 1555       Exercise Goal Re-Evaluation   Exercise Goals Review  Increase Physical Activity;Increase Strength and Stamina;Able to understand and use rate of perceived exertion (RPE) scale;Knowledge and understanding of Target Heart Rate Range (THRR);Understanding of Exercise Prescription  Increase Physical Activity;Increase Strength and Stamina;Able to understand and use rate of perceived exertion (RPE) scale;Knowledge and understanding of Target Heart Rate Range (THRR);Understanding of Exercise Prescription;Able to check pulse independently  Increase Physical Activity;Increase Strength and Stamina;Understanding of Exercise Prescription  Increase Physical Activity;Increase Strength  and Stamina;Understanding of Exercise Prescription    Comments  Reviewed RPE scale, THR and program prescription with pt today.  Pt voiced understanding and was given a copy of goals to take home.   Reviewed home exercise with pt today.  Pt plans to walk at home for exercise.  Reviewed THR, pulse, RPE, sign and symptoms, NTG use, and when to call 911 or MD.  Also discussed weather considerations and indoor options.  Pt voiced understanding.  Candace [name] has been doing well in rehab. She is walking 3 days a week at home for 56mn.  This past week she went Friday, Saturday, and Sunday.  She feels like her strength and stamina are starting to come back too!!    Candace [name] continues to do well in rehab. She has moved from the treadmill back to the track as she really enjoys dancing around!  We will continue to monitor her progress.     Expected Outcomes  Short: Use RPE daily to regulate intensity. Long: Follow program prescription in THR.  Short: Start walking at home on own.  Long: Continue to build strength and stamina.   Short: Continue to increase walking at home.  Long: Continue to work on Animator.   Short: Increase level on XR.  Long: Continue to walk more at home.        Discharge Exercise Prescription (Final Exercise Prescription Changes): Exercise Prescription Changes - 12/26/17 1500      Response to Exercise   Blood Pressure (Admit)  128/70    Blood Pressure (Exercise)  124/64    Blood Pressure (Exit)  118/62    Heart Rate (Admit)  88 bpm    Heart Rate (Exercise)  113 bpm    Heart Rate (Exit)  90 bpm    Rating of Perceived Exertion (Exercise)  13    Symptoms  none    Duration  Continue with 30 min of aerobic exercise without signs/symptoms of physical distress.    Intensity  THRR unchanged      Progression   Progression  Continue to progress workloads to maintain intensity without signs/symptoms of physical distress.    Average METs  2.76      Resistance Training   Training  Prescription  Yes    Weight  3 lbs    Reps  10-15      Interval Training   Interval Training  No      NuStep   Level  3    Minutes  15    METs  3      REL-XR   Level  2    Minutes  15    METs  2.3      Track   Laps  50    Minutes  15    METs  3      Home Exercise Plan   Plans to continue exercise at  Home (comment)   walking   Frequency  Add 3 additional days to program exercise sessions.    Initial Home Exercises Provided  12/13/17       Nutrition:  Target Goals: Understanding of nutrition guidelines, daily intake of sodium <1537m, cholesterol <2041m calories 30% from fat and 7% or less from saturated fats, daily to have 5 or more servings of fruits and vegetables.  Biometrics: Pre Biometrics - 11/24/17 1532      Pre Biometrics   Height  _0  (1.575 m)    Weight  169 lb 4.8 oz (76.8 kg)    Waist Circumference  36.5 inches    Hip Circumference  40.25 inches    Waist to Hip Ratio  0.91 %    BMI (Calculated)  30.96    Single Leg Stand  5.78 seconds        Nutrition Therapy Plan and Nutrition Goals: Nutrition Therapy & Goals - 12/06/17 0847      Nutrition Therapy   Diet  DM/ TLC    Protein (specify units)  11oz    Fiber  25 grams    Whole Grain Foods  3 servings    Saturated Fats  14 max. grams  Fruits and Vegetables  5 servings/day   8 ideal, working to increase fruit and vegetable intake   Sodium  1500 grams      Personal Nutrition Goals   Nutrition Goal  Because you typically eat 1-2 meals per day, it may be a good idea to consume Glucerna nutritional drinks on a more consistent basis as a meal replacement or snack between meals.    Personal Goal #2  Continue to monitor sodium intake and choose foods with less than 157m of sodium per serving    Personal Goal #3  Prioritize protein at meal times. Previously, you mentioned that dairy may be most feasible for you to eat reagularly. For example, drink a glass of milk or have a yogurt or cheese and  crackers as a snack    Comments  She continues to c/o decreased appetite but feels that she has been doing well overall with diet. Her BG readings typically stay less than 200 and above 100. She is consuming some dairy and drinks Glucerna shakes "occasionally"      Intervention Plan   Intervention  Prescribe, educate and counsel regarding individualized specific dietary modifications aiming towards targeted core components such as weight, hypertension, lipid management, diabetes, heart failure and other comorbidities.    Expected Outcomes  Short Term Goal: Understand basic principles of dietary content, such as calories, fat, sodium, cholesterol and nutrients.;Short Term Goal: A plan has been developed with personal nutrition goals set during dietitian appointment.;Long Term Goal: Adherence to prescribed nutrition plan.       Nutrition Assessments: Nutrition Assessments - 11/24/17 1537      MEDFICTS Scores   Pre Score  6       Nutrition Goals Re-Evaluation: Nutrition Goals Re-Evaluation    Row Name 12/06/17 0855 01/03/18 0819           Goals   Nutrition Goal  Because you typically eat 1-2 meals per day, it may be a good idea to consume Glucerna nutritional drinks on a more consistent basis as a meal replacement or snack between meals.  Consider drinking Glucerna nutritional drinks more consistently as a meal replacement d/t typically eating 1-2 meals/day for better BG control; monitor sodium intake more closely and aim to choose low sodium options; prioritize protein at meal times      Comment  She c/o decreased appetite but is a diabetic, requiring her to eat on a consistent schedule to best manage blood sugars  She continues to eat 1-2 meals a day, usually 1, and is drinking Glucerna shakes occasionally but not consistently. She is not monitoring sodium at this time but has been trying to focus on protein intake at her main meal of the day. She eats proteins like dairy products and beans  once per week on average. Feels that her diet primarily consists of fruits and vegetables. She feels she is doing better overall health-wise      Expected Outcome  She will include Glucerna shakes on a more consistent basis to help better meet nutritional needs and manage BG   She will work to be more consistent with meal/snack intake and to be mindful of the amount of sodium she is consuming. Continue to eat a variety of fruits and vegetables and to priortize protein intake at her main meal        Personal Goal #2 Re-Evaluation   Personal Goal #2  Continue to monitor sodium intake and choose foods with less than 1452mof sodium per serving  -  Personal Goal #3 Re-Evaluation   Personal Goal #3  Prioritize protein at meal times. Previously, you mentioned that diary may be most feasible for you to eat regularly. For example, drink a glass of milk or have a yogurt or cheese and crackers as a snack  -         Nutrition Goals Discharge (Final Nutrition Goals Re-Evaluation): Nutrition Goals Re-Evaluation - 01/03/18 0819      Goals   Nutrition Goal  Consider drinking Glucerna nutritional drinks more consistently as a meal replacement d/t typically eating 1-2 meals/day for better BG control; monitor sodium intake more closely and aim to choose low sodium options; prioritize protein at meal times    Comment  She continues to eat 1-2 meals a day, usually 1, and is drinking Glucerna shakes occasionally but not consistently. She is not monitoring sodium at this time but has been trying to focus on protein intake at her main meal of the day. She eats proteins like dairy products and beans once per week on average. Feels that her diet primarily consists of fruits and vegetables. She feels she is doing better overall health-wise    Expected Outcome  She will work to be more consistent with meal/snack intake and to be mindful of the amount of sodium she is consuming. Continue to eat a variety of fruits and  vegetables and to priortize protein intake at her main meal       Psychosocial: Target Goals: Acknowledge presence or absence of significant depression and/or stress, maximize coping skills, provide positive support system. Participant is able to verbalize types and ability to use techniques and skills needed for reducing stress and depression.   Initial Review & Psychosocial Screening: Initial Psych Review & Screening - 11/24/17 1533      Initial Review   Current issues with  History of Depression;Current Sleep Concerns;Current Stress Concerns    Source of Stress Concerns  Chronic Illness;Transportation;Unable to perform yard/household activities;Unable to participate in former interests or hobbies;Occupation    Comments  Sennie wants to go back to work.  She is still not able to drive but has appointment to review this on 12/08/17.  Her family has been taking her places and she is using lyft with insurance to get to some appointments. She has been sleeping better since last here, but still not where she wants to be.  She currently denies an depression symptoms.       Family Dynamics   Good Support System?  Yes    Comments  Kare's family is her support system.  Her sister brought her to orientation today.       Barriers   Psychosocial barriers to participate in program  The patient should benefit from training in stress management and relaxation.;Psychosocial barriers identified (see note)      Screening Interventions   Interventions  Encouraged to exercise;Program counselor consult;Provide feedback about the scores to participant    Expected Outcomes  Short Term goal: Utilizing psychosocial counselor, staff and physician to assist with identification of specific Stressors or current issues interfering with healing process. Setting desired goal for each stressor or current issue identified.;Long Term Goal: Stressors or current issues are controlled or eliminated.;Long Term goal: The participant  improves quality of Life and PHQ9 Scores as seen by post scores and/or verbalization of changes;Short Term goal: Identification and review with participant of any Quality of Life or Depression concerns found by scoring the questionnaire.       Quality of  Life Scores:  Quality of Life - 11/24/17 1537      Quality of Life   Select  Quality of Life      Quality of Life Scores   Health/Function Pre  17.43 %    Socioeconomic Pre  15.71 %    Psych/Spiritual Pre  23 %    Family Pre  18.38 %    GLOBAL Pre  18.39 %      Scores of 19 and below usually indicate a poorer quality of life in these areas.  A difference of  2-3 points is a clinically meaningful difference.  A difference of 2-3 points in the total score of the Quality of Life Index has been associated with significant improvement in overall quality of life, self-image, physical symptoms, and general health in studies assessing change in quality of life.  PHQ-9: Recent Review Flowsheet Data    Depression screen [facility] 2/9 12/13/2017 11/24/2017 08/04/2017 06/02/2017 04/25/2017   Decreased Interest 0 0 1 2 0   Down, Depressed, Hopeless 0 0 0 1 0   PHQ - 2 Score 0 0 1 3 0   Altered sleeping 0 _0 Tired, decreased energy _1 Change in appetite 1 1 0 2 2   Feeling bad or failure about yourself  0 0 0 1 0   Trouble concentrating 0 0 0 1 0   Moving slowly or fidgety/restless _2 0   Suicidal thoughts 0 0 0 0 2    PHQ-9 Score _3 Difficult doing work/chores Not difficult at all Not difficult at all Not difficult at all Not difficult at all Somewhat difficult     Interpretation of Total Score  Total Score Depression Severity:  1-4 = Minimal depression, 5-9 = Mild depression, 10-14 = Moderate depression, 15-19 = Moderately severe depression, 20-27 = Severe depression   Psychosocial Evaluation and Intervention: Psychosocial Evaluation - 12/13/17 0917      Psychosocial Evaluation & Interventions   Interventions   Stress management education;Encouraged to exercise with the program and follow exercise prescription    Comments  Ms. Neidlinger Candace [name]) has returned to this program subsequent to several medical procedures this year that have decreased her energy.  She is a 67 year old who lives with her daughters and is actively involved in her church.  She has multiple health issues with diabetes and chronic headaches in addition to a recent hysterectomy and the heart attack this past February.  She reports sleeping better now with the use of medications (for her headaches) and her appetite is up and down.  Tamalyn reports having a history of depression but is no longer experiencing those symptoms nor is on any medications for this.  Her mood is typically positive.  She states her current living condition is her primary stressor along with her medical limitations - including not being able to drive herself.  She has goals to get her strength back; her "life back to normal" and may even go back to work in some capacity.  Staff will be following with Candace [name].      Expected Outcomes  Short:  Candace [name] will exercise consistently to regain her strength.  Candace [name] will learn positive coping strategies to manage the stress in her life.  She will also meet with the dietician re: her appetite concerns.  Long:  Candace [name] will develop a positive self-care routine of diet and exercise  to maintain her health and mental health.      Continue Psychosocial Services   Follow up required by staff       Psychosocial Re-Evaluation: Psychosocial Re-Evaluation    [place] Name 12/09/17 1058 12/20/17 0811           Psychosocial Re-Evaluation   Current issues with  Current Stress Concerns  Current Stress Concerns;Current Sleep Concerns      Comments  Emsley left message to call her back.  She is very overwhelmed with her limitations. She would like to return to work and graduate early.  She would like to add in a third day. She is trying to work with her kids to get here.   She has been cleared to drive to store but feels this is such a limitation.  She got very emotional on the phone and wants to know what she can do to get to the doctor to clear her to drive sooner.  She is doing what she can but is very frustrated by everything.  She was saying she is very upset with her doctor as well as her daughter.  We will help her look into alternative means of transportation as well.   Catia is doing better mentally.  She has come terms about not driving.  She has started to use Ly[employer] with her Medicare today. She is going to try this and see how it goes.  She has made up with her daughter but still does not like that her daughter continues to give her instructions on what to do.  She does have a follow up in [place] with her neurologist again.  She is enjoying come to class and is starting to feel better overall. She did not sleep well over the weekend as her mind keeps running.   We talked about using some relaxation techniques to help relax her mind prior to going to sleep.       Expected Outcomes  Short: Look into other means of transportation.  Long: Continue to take the steps needed to return to driving.   Short: Continue to work with [employer] and try some relaxation techniques.  Long: Continue to cope positively.      Interventions  Stress management education;Encouraged to attend Cardiac Rehabilitation for the exercise  Stress management education;Encouraged to attend Cardiac Rehabilitation for the exercise      Continue Psychosocial Services   Follow up required by staff  Follow up required by staff      Comments  Doctor only cleared her to drive back and forth to grocery store. She wants her independence back.   Doctor only cleared her to drive back and forth to grocery store. She wants her independence back.         Initial Review   Source of Stress Concerns  Chronic Illness;Transportation;Unable to perform yard/household activities;Unable to participate in former interests or  hobbies;Occupation  Chronic Illness;Transportation;Unable to perform yard/household activities;Unable to participate in former interests or hobbies;Occupation         Psychosocial Discharge (Final Psychosocial Re-Evaluation): Psychosocial Re-Evaluation - 12/20/17 0811      Psychosocial Re-Evaluation   Current issues with  Current Stress Concerns;Current Sleep Concerns    Comments  Candace [name] is doing better mentally.  She has come terms about not driving.  She has started to use [employer] with her Medicare today. She is going to try this and see how it goes.  She has made up with her daughter but still does not  like that her daughter continues to give her instructions on what to do.  She does have a follow up in [place] with her neurologist again.  She is enjoying come to class and is starting to feel better overall. She did not sleep well over the weekend as her mind keeps running.   We talked about using some relaxation techniques to help relax her mind prior to going to sleep.     Expected Outcomes  Short: Continue to work with [employer] and try some relaxation techniques.  Long: Continue to cope positively.    Interventions  Stress management education;Encouraged to attend Cardiac Rehabilitation for the exercise    Continue Psychosocial Services   Follow up required by staff    Comments  Doctor only cleared her to drive back and forth to grocery store. She wants her independence back.       Initial Review   Source of Stress Concerns  Chronic Illness;Transportation;Unable to perform yard/household activities;Unable to participate in former interests or hobbies;Occupation       Vocational Rehabilitation: Provide vocational rehab assistance to qualifying candidates.   Vocational Rehab Evaluation & Intervention: Vocational Rehab - 11/24/17 1538      Initial Vocational Rehab Evaluation & Intervention   Assessment shows need for Vocational Rehabilitation  No       Education: Education Goals: Education  classes will be provided on a variety of topics geared toward better understanding of heart health and risk factor modification. Participant will state understanding/return demonstration of topics presented as noted by education test scores.  Learning Barriers/Preferences: Learning Barriers/Preferences - 11/24/17 1537      Learning Barriers/Preferences   Learning Barriers  None    Learning Preferences  None       Education Topics:  AED/CPR: - Group verbal and written instruction with the use of models to demonstrate the basic use of the AED with the basic ABC's of resuscitation.   Cardiac Rehab from 01/04/2018 in [facility] Cardiac and Pulmonary Rehab  Date  12/13/17  Educator  KS  Instruction Review Code  1- Verbalizes Understanding      General Nutrition Guidelines/Fats and Fiber: -Group instruction provided by verbal, written material, models and posters to present the general guidelines for heart healthy nutrition. Gives an explanation and review of dietary fats and fiber.   Cardiac Rehab from 04/08/2016 in [facility] Cardiac and Pulmonary Rehab  Date  04/06/16  Educator  PI  Instruction Review Code (retired)  2- meets goals/outcomes      Controlling Sodium/Reading Food Labels: -Group verbal and written material supporting the discussion of sodium use in heart healthy nutrition. Review and explanation with models, verbal and written materials for utilization of the food label.   Cardiac Rehab from 01/04/2018 in [facility] Cardiac and Pulmonary Rehab  Date  12/15/17  Educator  LB  Instruction Review Code  1- Verbalizes Understanding      Exercise Physiology & General Exercise Guidelines: - Group verbal and written instruction with models to review the exercise physiology of the cardiovascular system and associated critical values. Provides general exercise guidelines with specific guidelines to those with heart or lung disease.    Cardiac Rehab from 08/04/2017 in [facility] Cardiac and Pulmonary  Rehab  Date  07/07/17  Educator  [facility]  Instruction Review Code  1- Verbalizes Understanding      Aerobic Exercise & Resistance Training: - Gives group verbal and written instruction on the various components of exercise. Focuses on aerobic and resistive training programs and  the benefits of this training and how to safely progress through these programs..   Cardiac Rehab from 08/04/2017 in [facility] Cardiac and Pulmonary Rehab  Date  07/12/17  Educator  AS  Instruction Review Code  1- Verbalizes Understanding      Flexibility, Balance, Mind/Body Relaxation: Provides group verbal/written instruction on the benefits of flexibility and balance training, including mind/body exercise modes such as yoga, pilates and tai chi.  Demonstration and skill practice provided.   Cardiac Rehab from 01/04/2018 in [facility] Cardiac and Pulmonary Rehab  Date  01/03/18  Educator  AS  Instruction Review Code  1- Verbalizes Understanding      Stress and Anxiety: - Provides group verbal and written instruction about the health risks of elevated stress and causes of high stress.  Discuss the correlation between heart/lung disease and anxiety and treatment options. Review healthy ways to manage with stress and anxiety.   Cardiac Rehab from 08/04/2017 in [facility] Cardiac and Pulmonary Rehab  Date  07/26/17  Educator  [facility]  Instruction Review Code  1- Verbalizes Understanding      Depression: - Provides group verbal and written instruction on the correlation between heart/lung disease and depressed mood, treatment options, and the stigmas associated with seeking treatment.   Cardiac Rehab from 01/04/2018 in [facility] Cardiac and Pulmonary Rehab  Date  12/28/17  Educator  [facility]  Instruction Review Code  5- Refused Teaching      Anatomy & Physiology of the Heart: - Group verbal and written instruction and models provide basic cardiac anatomy and physiology, with the coronary electrical and arterial systems. Review of Valvular  disease and Heart Failure   Cardiac Rehab from 08/04/2017 in [facility] Cardiac and Pulmonary Rehab  Date  07/28/17  Educator  CE  Instruction Review Code  1- Verbalizes Understanding      Cardiac Procedures: - Group verbal and written instruction to review commonly prescribed medications for heart disease. Reviews the medication, class of the drug, and side effects. Includes the steps to properly store meds and maintain the prescription regimen. (beta blockers and nitrates)   Cardiac Rehab from 08/04/2017 in [facility] Cardiac and Pulmonary Rehab  Date  06/23/17  Educator  CE  Instruction Review Code  1- Verbalizes Understanding      Cardiac Medications I: - Group verbal and written instruction to review commonly prescribed medications for heart disease. Reviews the medication, class of the drug, and side effects. Includes the steps to properly store meds and maintain the prescription regimen.   Cardiac Rehab from 08/04/2017 in [facility] Cardiac and Pulmonary Rehab  Date  08/02/17  Educator  SB  Instruction Review Code  1- Verbalizes Understanding      Cardiac Medications II: -Group verbal and written instruction to review commonly prescribed medications for heart disease. Reviews the medication, class of the drug, and side effects. (all other drug classes)   Cardiac Rehab from 01/04/2018 in [facility] Cardiac and Pulmonary Rehab  Date  01/04/18  Educator  [facility]  Instruction Review Code  1- Verbalizes Understanding       Go Sex-Intimacy & Heart Disease, Get SMART - Goal Setting: - Group verbal and written instruction through game format to discuss heart disease and the return to sexual intimacy. Provides group verbal and written material to discuss and apply goal setting through the application of the S.M.A.R.T. Method.   Cardiac Rehab from 08/04/2017 in [facility] Cardiac and Pulmonary Rehab  Date  06/23/17  Educator  CE  Instruction Review Code  1-  Verbalizes Understanding      Other Matters of the Heart: -  Provides group verbal, written materials and models to describe Stable Angina and Peripheral Artery. Includes description of the disease process and treatment options available to the cardiac patient.   Exercise & Equipment Safety: - Individual verbal instruction and demonstration of equipment use and safety with use of the equipment.   Cardiac Rehab from 01/04/2018 in [facility] Cardiac and Pulmonary Rehab  Date  11/24/17  Educator   [facility]  Instruction Review Code  1- Verbalizes Understanding      Infection Prevention: - Provides verbal and written material to individual with discussion of infection control including proper hand washing and proper equipment cleaning during exercise session.   Cardiac Rehab from 01/04/2018 in [facility] Cardiac and Pulmonary Rehab  Date  11/24/17  Educator  [facility]  Instruction Review Code  1- Verbalizes Understanding      Falls Prevention: - Provides verbal and written material to individual with discussion of falls prevention and safety.   Cardiac Rehab from 01/04/2018 in [facility] Cardiac and Pulmonary Rehab  Date  11/24/17  Educator  [facility]  Instruction Review Code  1- Verbalizes Understanding      Diabetes: - Individual verbal and written instruction to review signs/symptoms of diabetes, desired ranges of glucose level fasting, after meals and with exercise. Acknowledge that pre and post exercise glucose checks will be done for 3 sessions at entry of program.   Cardiac Rehab from 01/04/2018 in [facility] Cardiac and Pulmonary Rehab  Date  11/24/17  Educator  [facility]  Instruction Review Code  1- Verbalizes Understanding      Know Your Numbers and Risk Factors: -Group verbal and written instruction about important numbers in your health.  Discussion of what are risk factors and how they play a role in the disease process.  Review of Cholesterol, Blood Pressure, Diabetes, and BMI and the role they play in your overall health.   Cardiac Rehab from 01/04/2018 in [facility] Cardiac and  Pulmonary Rehab  Date  01/04/18  Educator  [facility]  Instruction Review Code  1- Verbalizes Understanding      Sleep Hygiene: -Provides group verbal and written instruction about how sleep can affect your health.  Define sleep hygiene, discuss sleep cycles and impact of sleep habits. Review good sleep hygiene tips.    Other: -Provides group and verbal instruction on various topics (see comments)   Knowledge Questionnaire Score: Knowledge Questionnaire Score - 11/24/17 1538      Knowledge Questionnaire Score   Pre Score  24/26   Test reviewed with pt today.  Education Focus: PAD, Nutrition      Core Components/Risk Factors/Patient Goals at Admission: Personal Goals and Risk Factors at Admission - 11/24/17 1539      Core Components/Risk Factors/Patient Goals on Admission    Weight Management  Yes;Obesity;Weight Loss    Intervention  Weight Management: Develop a combined nutrition and exercise program designed to reach desired caloric intake, while maintaining appropriate intake of nutrient and fiber, sodium and fats, and appropriate energy expenditure required for the weight goal.;Weight Management: Provide education and appropriate resources to help participant work on and attain dietary goals.;Weight Management/Obesity: Establish reasonable short term and long term weight goals.;Obesity: Provide education and appropriate resources to help participant work on and attain dietary goals.    Admit Weight  169 lb 4.8 oz (76.8 kg)    Goal Weight: Short Term  159 lb (72.1 kg)    Goal Weight: Long Term  145 lb (65.8 kg)    Expected Outcomes  Short Term: Continue to assess and modify interventions until short term weight is achieved;Long Term: Adherence to nutrition and physical activity/exercise program aimed toward attainment of established weight goal;Weight Loss: Understanding of general recommendations for a balanced deficit meal plan, which promotes 1-2 lb weight loss per week and includes a  negative energy balance of [phone] kcal/d;Understanding recommendations for meals to include 15-35% energy as protein, 25-35% energy from fat, 35-60% energy from carbohydrates, less than 230m of dietary cholesterol, 20-35 gm of total fiber daily;Understanding of distribution of calorie intake throughout the day with the consumption of 4-5 meals/snacks    Diabetes  Yes    Intervention  Provide education about signs/symptoms and action to take for hypo/hyperglycemia.;Provide education about proper nutrition, including hydration, and aerobic/resistive exercise prescription along with prescribed medications to achieve blood glucose in normal ranges: Fasting glucose 65-99 mg/dL    Expected Outcomes  Short Term: Participant verbalizes understanding of the signs/symptoms and immediate care of hyper/hypoglycemia, proper foot care and importance of medication, aerobic/resistive exercise and nutrition plan for blood glucose control.;Long Term: Attainment of HbA1C < 7%.    Heart Failure  Yes    Intervention  Provide a combined exercise and nutrition program that is supplemented with education, support and counseling about heart failure. Directed toward relieving symptoms such as shortness of breath, decreased exercise tolerance, and extremity edema.    Expected Outcomes  Improve functional capacity of life;Short term: Attendance in program 2-3 days a week with increased exercise capacity. Reported lower sodium intake. Reported increased fruit and vegetable intake. Reports medication compliance.;Short term: Daily weights obtained and reported for increase. Utilizing diuretic protocols set by physician.;Long term: Adoption of self-care skills and reduction of barriers for early signs and symptoms recognition and intervention leading to self-care maintenance.    Hypertension  Yes    Intervention  Provide education on lifestyle modifcations including regular physical activity/exercise, weight management, moderate sodium  restriction and increased consumption of fresh fruit, vegetables, and low fat dairy, alcohol moderation, and smoking cessation.;Monitor prescription use compliance.    Expected Outcomes  Short Term: Continued assessment and intervention until BP is < 140/958mHG in hypertensive participants. < 130/8061mG in hypertensive participants with diabetes, heart failure or chronic kidney disease.;Long Term: Maintenance of blood pressure at goal levels.    Lipids  Yes    Intervention  Provide education and support for participant on nutrition & aerobic/resistive exercise along with prescribed medications to achieve LDL <6m36mDL >40mg43m Expected Outcomes  Short Term: Participant states understanding of desired cholesterol values and is compliant with medications prescribed. Participant is following exercise prescription and nutrition guidelines.;Long Term: Cholesterol controlled with medications as prescribed, with individualized exercise RX and with personalized nutrition plan. Value goals: LDL < 6mg,15m > 40 mg.       Core Components/Risk Factors/Patient Goals Review:  Goals and Risk Factor Review    Row Name 12/20/17 0816             Core Components/Risk Factors/Patient Goals Review   Personal Goals Review  Weight Management/Obesity;Hypertension;Diabetes;Lipids;Heart Failure       Review  Candace [name] isJannahing well in rehab. Her weight has been holding steady and she does weigh everyday. She has not had any heart failure symtpoms, denies swelling and SOB.  She has been taking her mediations and her numbers have been looking pretty good for her blood pressures and blood sugars.  She has  been checking them occasionally at home for pressure but not her sugars.  We talked about that if she wants to come off her meds, she needs to be keeping records of her numbers.  She agreed to start checking more frequently and to record it.  She will continue to check her numbres daily.       Expected Outcomes  Short: Check  blood pressures and blood sugars more frequently.  Long: Continue to take medications and monitor heart failure symtoms.           Core Components/Risk Factors/Patient Goals at Discharge (Final Review):  Goals and Risk Factor Review - 12/20/17 0816      Core Components/Risk Factors/Patient Goals Review   Personal Goals Review  Weight Management/Obesity;Hypertension;Diabetes;Lipids;Heart Failure    Review  Candace [name] is doing well in rehab. Her weight has been holding steady and she does weigh everyday. She has not had any heart failure symtpoms, denies swelling and SOB.  She has been taking her mediations and her numbers have been looking pretty good for her blood pressures and blood sugars.  She has been checking them occasionally at home for pressure but not her sugars.  We talked about that if she wants to come off her meds, she needs to be keeping records of her numbers.  She agreed to start checking more frequently and to record it.  She will continue to check her numbres daily.    Expected Outcomes  Short: Check blood pressures and blood sugars more frequently.  Long: Continue to take medications and monitor heart failure symtoms.        ITP Comments: ITP Comments    Row Name 11/24/17 1231 11/24/17 1519 12/07/17 0623 01/04/18 0754     ITP Comments  Candace [name] does not want to be on any medications and has stopped taking her medications.  Her heart rate was elevated today since she hasn't taken her .  We talked about her needing to take medications for her safety and will required to take them to attend rehab. She promises to take her heart meds and diabetes.   Medical evaluation completed.  Documentation can be found in [facility] encounter 10/29/17.  Initial ITP created and sent for review to Dr. Emily Filbert, Medical Director of Cardiac Rehab.   30 day review completed. ITP sent to Dr. Emily Filbert, Medical Director of Cardiac Rehab. Continue with ITP unless changes are made by physician  New to program  30 day  review completed. ITP sent to Dr. Emily Filbert, Medical Director of Cardiac Rehab. Continue with ITP unless changes are made by physician       Comments:

## 2018-01-05 ENCOUNTER — Encounter: Payer: Medicare Other | Admitting: *Deleted

## 2018-01-05 DIAGNOSIS — I25118 Atherosclerotic heart disease of native coronary artery with other forms of angina pectoris: Secondary | ICD-10-CM | POA: Diagnosis not present

## 2018-01-05 DIAGNOSIS — I208 Other forms of angina pectoris: Secondary | ICD-10-CM

## 2018-01-05 NOTE — Progress Notes (Signed)
Daily Session Note  Patient Details  Name: Candace [name] MRN: [id number] Date of Birth: 1950-08-18 Referring Provider:     Cardiac Rehab from 11/24/2017 in [facility] Cardiac and Pulmonary Rehab  Referring Provider  [name]      Encounter Date: 01/05/2018  Check In: Session Check In - 01/05/18 0811      Check-In   Supervising physician immediately available to respond to emergencies  See telemetry face sheet for immediately available ER MD    Location  [facility]-Cardiac & Pulmonary Rehab    Staff Present  Gerlene Burdock, RN, BSN;Leslie Castrejon RN, BSN;Tajia Szeliga [place], MA, RCEP, CCRP, Exercise Physiologist;Amanda Oletta Darter, BA, ACSM CEP, Exercise Physiologist    Medication changes reported      No    Fall or balance concerns reported     No    Warm-up and Cool-down  Performed on first and last piece of equipment    Resistance Training Performed  Yes    VAD Patient?  No    PAD/SET Patient?  No      Pain Assessment   Currently in Pain?  No/denies          Social History   Tobacco Use  Smoking Status Former Smoker  . Packs/day: 0.50  . Types: Cigarettes  . Last attempt to quit: 10/07/2001  . Years since quitting: 16.2  Smokeless Tobacco Never Used    Goals Met:  Independence with exercise equipment Exercise tolerated well No report of cardiac concerns or symptoms Strength training completed today  Goals Unmet:  Not Applicable  Comments: Pt able to follow exercise prescription today without complaint.  Will continue to monitor for progression.    Dr. Emily Filbert is Medical Director for [place] and [facility].

## 2018-01-10 DIAGNOSIS — I208 Other forms of angina pectoris: Secondary | ICD-10-CM

## 2018-01-10 DIAGNOSIS — I25118 Atherosclerotic heart disease of native coronary artery with other forms of angina pectoris: Secondary | ICD-10-CM | POA: Diagnosis not present

## 2018-01-10 NOTE — Progress Notes (Signed)
Daily Session Note  Patient Details  Name: Candace [name] MRN: [id number] Date of Birth: 01-13-1951 Referring Provider:     Cardiac Rehab from 11/24/2017 in [facility] Cardiac and Pulmonary Rehab  Referring Provider  [name]      Encounter Date: 01/10/2018  Check In: Session Check In - 01/10/18 0941      Check-In   Supervising physician immediately available to respond to emergencies  See telemetry face sheet for immediately available ER MD    Location  [facility]Cardiac [facility] Pul[facility] Re[facility]    Staff Present  Nyoka Cowden, RN, BSN, Kela Millin, BA, ACSM CEP, Exercise Physiologist;Krista Frederico Hamman, RN BSN    Medication changes reported      No    Fall or balance concerns reported     No    Warm-up and Cool-down  Performed on first and last piece of equipment    Resistance Training Performed  Yes    VAD Patient?  No    PAD/SET Patient?  No      Pain Assessment   Currently in Pain?  No/denies          Social History   Tobacco Use  Smoking Status Former Smoker  . Packs/day: 0.50  . Types: Cigarettes  . Last attempt to quit: 10/07/2001  . Years since quitting: 16.2  Smokeless Tobacco Never Used    Goals Met:  Independence with exercise equipment Exercise tolerated well No report of cardiac concerns or symptoms Strength training completed today  Goals Unmet:  Not Applicable  Comments: Pt able to follow exercise prescription today without complaint.  Will continue to monitor for progression.   Dr. Emily Filbert is Medical Director for [place] and [facility].

## 2018-01-10 NOTE — Progress Notes (Signed)
Daily Session Note  Patient Details  Name: Candace [name] MRN: [id number] Date of Birth: [birth year] Referring Provider:     Cardiac Rehab from 11/24/2017 in [facility] Cardiac and Pulmonary Rehab  Referring Provider  [name]      Encounter Date: 01/10/2018  Check In: Session Check In - 01/10/18 0941      Check-In   Supervising physician immediately available to respond to emergencies  See telemetry face sheet for immediately available ER MD    Location  [facility]Cardiac [facility] Re[facility]    Staff Present  Nyoka Cowden, RN, BSN, Kela Millin, BA, ACSM CEP, Exercise Physiologist;Krista Frederico Hamman, RN BSN    Medication changes reported      No    Fall or balance concerns reported     No    Warm-up and Cool-down  Performed on first and last piece of equipment    Resistance Training Performed  Yes    VAD Patient?  No    PAD/SET Patient?  No      Pain Assessment   Currently in Pain?  No/denies          Social History   Tobacco Use  Smoking Status Former Smoker  . Packs/day: 0.50  . Types: Cigarettes  . Last attempt to quit: 10/07/2001  . Years since quitting: 16.2  Smokeless Tobacco Never Used    Goals Met:  Independence with exercise equipment Exercise tolerated well No report of cardiac concerns or symptoms Strength training completed today  Goals Unmet:  Not Applicable  Comments: Pt able to follow exercise prescription today without complaint.  Will continue to monitor for progression.    Dr. Emily Filbert is Medical Director for [place] and [facility].

## 2018-01-11 DIAGNOSIS — I25118 Atherosclerotic heart disease of native coronary artery with other forms of angina pectoris: Secondary | ICD-10-CM | POA: Diagnosis not present

## 2018-01-11 DIAGNOSIS — I208 Other forms of angina pectoris: Secondary | ICD-10-CM

## 2018-01-11 NOTE — Progress Notes (Signed)
Daily Session Note  Patient Details  Name: Candace [name] MRN: [id number] Date of Birth: 23-Sep-1950 Referring Provider:     Cardiac Rehab from 11/24/2017 in [facility] Cardiac and Pulmonary Rehab  Referring Provider  [name]      Encounter Date: 01/11/2018  Check In: Session Check In - 01/11/18 0732      Check-In   Supervising physician immediately available to respond to emergencies  See telemetry face sheet for immediately available ER MD    Location  [facility] Re[facility]    Staff Present  Alberteen Sam, MA, RCEP, CCRP, Exercise Physiologist;Chyna Kneece [place];Heath Lark, RN, BSN, CCRP    Medication changes reported      No    Fall or balance concerns reported     No    Warm-up and Cool-down  Performed on first and last piece of equipment    Resistance Training Performed  Yes    VAD Patient?  No    PAD/SET Patient?  No      Pain Assessment   Currently in Pain?  No/denies          Social History   Tobacco Use  Smoking Status Former Smoker  . Packs/day: 0.50  . Types: Cigarettes  . Last attempt to quit: 10/07/2001  . Years since quitting: 16.2  Smokeless Tobacco Never Used    Goals Met:  Independence with exercise equipment Exercise tolerated well No report of cardiac concerns or symptoms Strength training completed today  Goals Unmet:  Not Applicable  Comments: Pt able to follow exercise prescription today without complaint.  Will continue to monitor for progression.    Dr. Emily Filbert is Medical Director for [place] and [facility].

## 2018-01-12 ENCOUNTER — Encounter: Payer: Medicare Other | Admitting: *Deleted

## 2018-01-12 ENCOUNTER — Telehealth: Payer: Self-pay | Admitting: Family Medicine

## 2018-01-12 DIAGNOSIS — I25118 Atherosclerotic heart disease of native coronary artery with other forms of angina pectoris: Secondary | ICD-10-CM | POA: Diagnosis not present

## 2018-01-12 DIAGNOSIS — M79676 Pain in unspecified toe(s): Secondary | ICD-10-CM

## 2018-01-12 DIAGNOSIS — I208 Other forms of angina pectoris: Secondary | ICD-10-CM

## 2018-01-12 NOTE — Telephone Encounter (Signed)
Patient wants to be referred back to podiatrist at [facility] for pain in her toes and thick toenails.  She has been there before but requires [place] to do a referral.

## 2018-01-12 NOTE — Progress Notes (Signed)
Daily Session Note  Patient Details  Name: Candace [name] MRN: [id number] Date of Birth: 1951-03-21 Referring Provider:     Cardiac Rehab from 11/24/2017 in [facility] Cardiac and Pulmonary Rehab  Referring Provider  [name]      Encounter Date: 01/12/2018  Check In: Session Check In - 01/12/18 0819      Check-In   Supervising physician immediately available to respond to emergencies  See telemetry face sheet for immediately available ER MD    Location  [facility]    Staff Present  Gerlene Burdock, RN, BSN;Valary Manahan [place], MA, RCEP, CCRP, Exercise Physiologist;Amanda Oletta Darter, BA, ACSM CEP, Exercise Physiologist    Medication changes reported      No    Fall or balance concerns reported     No    Warm-up and Cool-down  Performed on first and last piece of equipment    Resistance Training Performed  Yes    VAD Patient?  No    PAD/SET Patient?  No      Pain Assessment   Currently in Pain?  No/denies          Social History   Tobacco Use  Smoking Status Former Smoker  . Packs/day: 0.50  . Types: Cigarettes  . Last attempt to quit: 10/07/2001  . Years since quitting: 16.2  Smokeless Tobacco Never Used    Goals Met:  Independence with exercise equipment Exercise tolerated well Personal goals reviewed No report of cardiac concerns or symptoms Strength training completed today  Goals Unmet:  Not Applicable  Comments: Pt able to follow exercise prescription today without complaint.  Will continue to monitor for progression.    Dr. Emily Filbert is Medical Director for [place] and [facility].

## 2018-01-13 LAB — LIPID PANEL WITH LDL/HDL RATIO
Cholesterol, Total: 167 mg/dL (ref 100–199)
HDL: 51 mg/dL (ref >39)
LDL Calculated: 87 mg/dL (ref 0–99)
LDl/HDL Ratio: 1.7 ratio (ref 0.0–3.2)
Triglycerides: 145 mg/dL (ref 0–149)
VLDL Cholesterol Cal: 29 mg/dL (ref 5–40)

## 2018-01-13 LAB — COMPREHENSIVE METABOLIC PANEL
ALT: 7 IU/L (ref 0–32)
AST: 16 IU/L (ref 0–40)
Albumin/Globulin Ratio: 1.9 (ref 1.2–2.2)
Albumin: 4.5 g/dL (ref 3.6–4.8)
Alkaline Phosphatase: 109 IU/L (ref 39–117)
BUN/Creatinine Ratio: 16 (ref 12–28)
BUN: 13 mg/dL (ref 8–27)
Bilirubin Total: 0.3 mg/dL (ref 0.0–1.2)
CO2: 16 mmol/L — ABNORMAL LOW (ref 20–29)
Calcium: 9.6 mg/dL (ref 8.7–10.3)
Chloride: 105 mmol/L (ref 96–106)
Creatinine, Ser: 0.8 mg/dL (ref 0.57–1.00)
GFR calc Af Amer: 89 mL/min/{1.73_m2} (ref >59)
GFR calc non Af Amer: 77 mL/min/{1.73_m2} (ref >59)
Globulin, Total: 2.4 g/dL (ref 1.5–4.5)
Glucose: 161 mg/dL — ABNORMAL HIGH (ref 65–99)
Potassium: 4.6 mmol/L (ref 3.5–5.2)
Sodium: 144 mmol/L (ref 134–144)
Total Protein: 6.9 g/dL (ref 6.0–8.5)

## 2018-01-13 LAB — HEMOGLOBIN A1C
Est. average glucose Bld gHb Est-mCnc: 189 mg/dL
Hgb A1c MFr Bld: 8.2 % — ABNORMAL HIGH (ref 4.8–5.6)

## 2018-01-17 ENCOUNTER — Telehealth: Payer: Self-pay

## 2018-01-17 DIAGNOSIS — I25118 Atherosclerotic heart disease of native coronary artery with other forms of angina pectoris: Secondary | ICD-10-CM | POA: Diagnosis not present

## 2018-01-17 DIAGNOSIS — I208 Other forms of angina pectoris: Secondary | ICD-10-CM

## 2018-01-17 NOTE — Telephone Encounter (Signed)
-----   Message from [name]., MD sent at 01/13/2018  1:12 PM EDT ----- Stable--lipids better.

## 2018-01-17 NOTE — Telephone Encounter (Signed)
Left message advising pt.  (Per DPR)  Thanks,   -Laura  

## 2018-01-17 NOTE — Progress Notes (Signed)
Daily Session Note  Patient Details  Name: Candace [name] MRN: [id number] Date of Birth: 05-11-1950 Referring Provider:     Cardiac Rehab from 11/24/2017 in [facility] Cardiac and Pulmonary Rehab  Referring Provider  [name]      Encounter Date: 01/17/2018  Check In: Session Check In - 01/17/18 0831      Check-In   Supervising physician immediately available to respond to emergencies  See telemetry face sheet for immediately available ER MD    Location  [facility] Re[facility]    Staff Present  Carson Myrtle, BS, RRT, Respiratory Lennie Hummer, MA, RCEP, CCRP, Exercise Physiologist;Amanda Oletta Darter, BA, ACSM CEP, Exercise Physiologist;Susanne Bice, RN, BSN, CCRP    Medication changes reported      No    Fall or balance concerns reported     No    Warm-up and Cool-down  Performed on first and last piece of equipment    Resistance Training Performed  Yes    VAD Patient?  No    PAD/SET Patient?  No      Pain Assessment   Currently in Pain?  No/denies          Social History   Tobacco Use  Smoking Status Former Smoker  . Packs/day: 0.50  . Types: Cigarettes  . Last attempt to quit: 10/07/2001  . Years since quitting: 16.2  Smokeless Tobacco Never Used    Goals Met:  Independence with exercise equipment Exercise tolerated well No report of cardiac concerns or symptoms Strength training completed today  Goals Unmet:  Not Applicable  Comments:Pt able to follow exercise prescription today without complaint.  Will continue to monitor for progression.   Dr. Emily Filbert is Medical Director for [place] and [facility].

## 2018-01-18 DIAGNOSIS — I25118 Atherosclerotic heart disease of native coronary artery with other forms of angina pectoris: Secondary | ICD-10-CM | POA: Diagnosis not present

## 2018-01-18 DIAGNOSIS — I208 Other forms of angina pectoris: Secondary | ICD-10-CM

## 2018-01-18 NOTE — Progress Notes (Signed)
Daily Session Note  Patient Details  Name: Candace [name] MRN: [id number] Date of Birth: Sep 02, 1950 Referring Provider:     Cardiac Rehab from 11/24/2017 in [facility] Cardiac and Pulmonary Rehab  Referring Provider  [name]      Encounter Date: 01/18/2018  Check In: Session Check In - 01/18/18 0752      Check-In   Supervising physician immediately available to respond to emergencies  See telemetry face sheet for immediately available ER MD    Location  [facility]Cardiac [facility] Pulmonary Rehab    Staff Present  Alberteen Sam, MA, RCEP, CCRP, Exercise Physiologist;Caius Silbernagel  [place];Heath Lark, RN, BSN, CCRP    Medication changes reported      No    Fall or balance concerns reported     No    Warm-up and Cool-down  Performed on first and last piece of equipment    Resistance Training Performed  Yes    VAD Patient?  No    PAD/SET Patient?  No      Pain Assessment   Currently in Pain?  No/denies          Social History   Tobacco Use  Smoking Status Former Smoker  . Packs/day: 0.50  . Types: Cigarettes  . Last attempt to quit: 10/07/2001  . Years since quitting: 16.2  Smokeless Tobacco Never Used    Goals Met:  Independence with exercise equipment Exercise tolerated well No report of cardiac concerns or symptoms Strength training completed today  Goals Unmet:  Not Applicable  Comments: Pt able to follow exercise prescription today without complaint.  Will continue to monitor for progression.   Dr. Emily Filbert is Medical Director for [place] and [facility].

## 2018-01-19 DIAGNOSIS — I208 Other forms of angina pectoris: Secondary | ICD-10-CM

## 2018-01-19 DIAGNOSIS — I25118 Atherosclerotic heart disease of native coronary artery with other forms of angina pectoris: Secondary | ICD-10-CM | POA: Diagnosis not present

## 2018-01-19 NOTE — Progress Notes (Signed)
Daily Session Note  Patient Details  Name: Candace [name] MRN: [id number] Date of Birth: 07/08/1950 Referring Provider:     Cardiac Rehab from 11/24/2017 in [facility] Cardiac and Pulmonary Rehab  Referring Provider  Kowalski, Bruce MD      Encounter Date: 01/19/2018  Check In: Session Check In - 01/19/18 0839      Check-In   Supervising physician immediately available to respond to emergencies  See telemetry face sheet for immediately available ER MD    Location  [facility]Cardiac [facility] Pulmonary Rehab    Staff Present  Laureen Brown, BS, RRT, Respiratory Therapist;Jessica Hawkins, MA, RCEP, CCRP, Exercise Physiologist;Carroll Enterkin, RN, BSN    Medication changes reported      No    Fall or balance concerns reported     No    Warm-up and Cool-down  Performed on first and last piece of equipment    Resistance Training Performed  Yes    VAD Patient?  No    PAD/SET Patient?  No      Pain Assessment   Currently in Pain?  No/denies    Multiple Pain Sites  No          Social History   Tobacco Use  Smoking Status Former Smoker  . Packs/day: 0.50  . Types: Cigarettes  . Last attempt to quit: 10/07/2001  . Years since quitting: 16.2  Smokeless Tobacco Never Used    Goals Met:  Independence with exercise equipment Exercise tolerated well No report of cardiac concerns or symptoms Strength training completed today  Goals Unmet:  Not Applicable  Comments: Pt able to follow exercise prescription today without complaint.  Will continue to monitor for progression.   Dr. Mark Miller is Medical Director for [facility] and [facility]. 

## 2018-01-19 NOTE — Telephone Encounter (Signed)
Order has been placed.

## 2018-01-19 NOTE — Telephone Encounter (Signed)
ok 

## 2018-01-24 ENCOUNTER — Encounter: Payer: Self-pay | Admitting: *Deleted

## 2018-01-24 DIAGNOSIS — I208 Other forms of angina pectoris: Secondary | ICD-10-CM

## 2018-01-24 NOTE — Progress Notes (Signed)
Candace [name] arrived today for cardiac rehab session. She stated she has not been able to pick up her refill of Plavix and has been off of it for 1 week. She c/o SOB and some occasional chest tightness in the past few days. Candace [name]'s BP was 122/78 and weight remained consistent (173.7lb), however, due to her symptoms she was advised to go to Dr. Alveria Apley office in [place] clinic once they opened at 8am to speak with a nurse regarding symptoms and medication. Candace [name] understood and was escorted upstairs by Darcella Gasman, RT.

## 2018-01-26 ENCOUNTER — Encounter: Payer: Medicare Other | Admitting: *Deleted

## 2018-01-26 DIAGNOSIS — I25118 Atherosclerotic heart disease of native coronary artery with other forms of angina pectoris: Secondary | ICD-10-CM | POA: Diagnosis not present

## 2018-01-26 DIAGNOSIS — I208 Other forms of angina pectoris: Secondary | ICD-10-CM

## 2018-01-26 NOTE — Progress Notes (Signed)
Daily Session Note  Patient Details  Name: Candace [name] MRN: [id number] Date of Birth: Jul 04, 1950 Referring Provider:     Cardiac Rehab from 11/24/2017 in [facility] Cardiac and Pulmonary Rehab  Referring Provider  [name]      Encounter Date: 01/26/2018  Check In: Session Check In - 01/26/18 0743      Check-In   Supervising physician immediately available to respond to emergencies  See telemetry face sheet for immediately available ER MD    Location  [facility] Re[facility]    Staff Present  Alberteen Sam, MA, RCEP, CCRP, Exercise Physiologist;Mary Kellie Shropshire, RN, BSN, Kela Millin, BA, ACSM CEP, Exercise Physiologist    Medication changes reported      Yes    Comments  increased imdur and statin    Fall or balance concerns reported     No    Warm-up and Cool-down  Performed on first and last piece of equipment    Resistance Training Performed  Yes    VAD Patient?  No    PAD/SET Patient?  No      Pain Assessment   Currently in Pain?  No/denies          Social History   Tobacco Use  Smoking Status Former Smoker  . Packs/day: 0.50  . Types: Cigarettes  . Last attempt to quit: 10/07/2001  . Years since quitting: 16.3  Smokeless Tobacco Never Used    Goals Met:  Independence with exercise equipment Exercise tolerated well No report of cardiac concerns or symptoms Strength training completed today  Goals Unmet:  Not Applicable  Comments: Pt able to follow exercise prescription today without complaint.  Will continue to monitor for progression.    Dr. Emily Filbert is Medical Director for [place] and [facility].

## 2018-01-31 ENCOUNTER — Encounter: Payer: Medicare Other | Attending: Internal Medicine | Admitting: *Deleted

## 2018-01-31 VITALS — Ht 62.0 in | Wt 174.0 lb

## 2018-01-31 DIAGNOSIS — F329 Major depressive disorder, single episode, unspecified: Secondary | ICD-10-CM | POA: Diagnosis not present

## 2018-01-31 DIAGNOSIS — D649 Anemia, unspecified: Secondary | ICD-10-CM | POA: Insufficient documentation

## 2018-01-31 DIAGNOSIS — I208 Other forms of angina pectoris: Secondary | ICD-10-CM | POA: Diagnosis present

## 2018-01-31 DIAGNOSIS — Z8673 Personal history of transient ischemic attack (TIA), and cerebral infarction without residual deficits: Secondary | ICD-10-CM | POA: Diagnosis not present

## 2018-01-31 DIAGNOSIS — I10 Essential (primary) hypertension: Secondary | ICD-10-CM | POA: Diagnosis not present

## 2018-01-31 DIAGNOSIS — I25118 Atherosclerotic heart disease of native coronary artery with other forms of angina pectoris: Secondary | ICD-10-CM | POA: Diagnosis not present

## 2018-01-31 DIAGNOSIS — F419 Anxiety disorder, unspecified: Secondary | ICD-10-CM | POA: Diagnosis not present

## 2018-01-31 DIAGNOSIS — Z87891 Personal history of nicotine dependence: Secondary | ICD-10-CM | POA: Insufficient documentation

## 2018-01-31 DIAGNOSIS — Z7902 Long term (current) use of antithrombotics/antiplatelets: Secondary | ICD-10-CM | POA: Insufficient documentation

## 2018-01-31 DIAGNOSIS — K219 Gastro-esophageal reflux disease without esophagitis: Secondary | ICD-10-CM | POA: Insufficient documentation

## 2018-01-31 DIAGNOSIS — E785 Hyperlipidemia, unspecified: Secondary | ICD-10-CM | POA: Insufficient documentation

## 2018-01-31 DIAGNOSIS — E119 Type 2 diabetes mellitus without complications: Secondary | ICD-10-CM | POA: Diagnosis not present

## 2018-01-31 DIAGNOSIS — Z7984 Long term (current) use of oral hypoglycemic drugs: Secondary | ICD-10-CM | POA: Insufficient documentation

## 2018-01-31 DIAGNOSIS — Z79899 Other long term (current) drug therapy: Secondary | ICD-10-CM | POA: Diagnosis not present

## 2018-01-31 DIAGNOSIS — I252 Old myocardial infarction: Secondary | ICD-10-CM | POA: Diagnosis not present

## 2018-01-31 NOTE — Progress Notes (Signed)
Daily Session Note  Patient Details  Name: Candace [name] MRN: [id number] Date of Birth: November 10, 1950 Referring Provider:     Cardiac Rehab from 11/24/2017 in [facility] Cardiac and Pulmonary Rehab  Referring Provider  Candace Royals MD      Encounter Date: 01/31/2018  Check In: Session Check In - 01/31/18 0817      Check-In   Supervising physician immediately available to respond to emergencies  See telemetry face sheet for immediately available ER MD    Location  [facility]Cardiac [facility] Pulmonary Rehab    Staff Present  Candace Sam, MA, RCEP, CCRP, Exercise Physiologist;Candace Bice, RN, BSN, CCRP;Other   Candace [name]   Medication changes reported      No    Fall or balance concerns reported     Yes    Comments  Pt fell during walk test    Warm-up and Cool-down  Performed on first and last piece of equipment    Resistance Training Performed  Yes    VAD Patient?  No    PAD/SET Patient?  No      Pain Assessment   Currently in Pain?  No/denies          Social History   Tobacco Use  Smoking Status Former Smoker  . Packs/day: 0.50  . Types: Cigarettes  . Last attempt to quit: 10/07/2001  . Years since quitting: 16.3  Smokeless Tobacco Never Used    Goals Met:  Independence with exercise equipment Exercise tolerated well No report of cardiac concerns or symptoms Strength training completed today  Goals Unmet:  Not Applicable  Comments: Pt able to follow exercise prescription today without complaint.  Will continue to monitor for progression. [place] Name 11/24/17 1524 01/31/18 0818       6 Minute Walk   Phase  Initial  Discharge    Distance  1142 feet  1285 feet    Distance % Change  -  12.5 %    Distance Feet Change  -  143 ft    Walk Time  6 minutes  6 minutes    # of Rest Breaks  0  0    MPH  2.16  2.43    METS  3.5  3.07    RPE  9  14    Perceived Dyspnea   0  1    VO2 Peak  12.25  10.75    Symptoms  No  No    Resting HR  99 bpm  67 bpm    Resting BP  122/74  110/82    Resting Oxygen Saturation   95 %  98 %    Exercise Oxygen Saturation  during 6 min walk  98 %  99 %    Max Ex. HR  147 bpm  108 bpm    Max Ex. BP  174/88  158/74    2 Minute Post BP  134/84  134/70      Candace [name] fell after the first minute of the walk test.  She stumbled at the door with the carpet to tile transition. She then tried to catch herself on the door and then to her knees and then she laid down for a minute to catch her breath and assess what happened.  She was helped back to up to lean against wall.  Eventually she was able to get up on her own to the chair.  Blood pressure was 136/78.  She skinned her knees  a little and elbow feels sore from hitting door.  She was given some lotion for her knees and ice packs.  She chose to walk again after about 34mn of rest.  She was able to walk without a problem but her knees did hurt at the end (6/10).  She was able to finish class today and used the NuStep with ice on her knees.  She states that she feels fine and is planning to go to her appointment with podiatry after class and will let [place] if anything else comes up.    Dr. MEmily [name] Medical Director for [place] [facility].

## 2018-02-01 ENCOUNTER — Encounter: Payer: Self-pay | Admitting: *Deleted

## 2018-02-01 DIAGNOSIS — I2089 Other forms of angina pectoris: Secondary | ICD-10-CM

## 2018-02-01 DIAGNOSIS — I208 Other forms of angina pectoris: Secondary | ICD-10-CM

## 2018-02-01 DIAGNOSIS — I25118 Atherosclerotic heart disease of native coronary artery with other forms of angina pectoris: Secondary | ICD-10-CM | POA: Diagnosis not present

## 2018-02-01 NOTE — Progress Notes (Signed)
Daily Session Note  Patient Details  Name: MORRISA ALDABA MRN: [id number] Date of Birth: December 12, 1950 Referring Provider:     Cardiac Rehab from 11/24/2017 in [facility] Cardiac and Pulmonary Rehab  Referring Provider  [name]      Encounter Date: 02/01/2018  Check In: Session Check In - 02/01/18 0725      Check-In   Supervising physician immediately available to respond to emergencies  See telemetry face sheet for immediately available ER MD    Location  [facility] Re[facility]    Staff Present  Alberteen Sam, MA, RCEP, CCRP, Exercise Physiologist;Isley Weisheit [place] [employer];Heath Lark, RN, BSN, CCRP    Medication changes reported      No    Warm-up and Cool-down  Performed on first and last piece of equipment    Resistance Training Performed  Yes    VAD Patient?  No    PAD/SET Patient?  No      Pain Assessment   Currently in Pain?  No/denies          Social History   Tobacco Use  Smoking Status Former Smoker  . Packs/day: 0.50  . Types: Cigarettes  . Last attempt to quit: 10/07/2001  . Years since quitting: 16.3  Smokeless Tobacco Never Used    Goals Met:  Independence with exercise equipment Exercise tolerated well No report of cardiac concerns or symptoms Strength training completed today  Goals Unmet:  Not Applicable  Comments: Pt able to follow exercise prescription today without complaint.  Will continue to monitor for progression.   Dr. Emily Filbert is Medical Director for [place] and [facility].

## 2018-02-01 NOTE — Progress Notes (Signed)
Cardiac Individual Treatment Plan  Patient Details  Name: Candace [name] MRN: [id number] Date of Birth: 09/21/1950 Referring Provider:     Cardiac Rehab from 11/24/2017 in [facility] Cardiac and Pulmonary Rehab  Referring Provider  [name]      Initial Encounter Date:    Cardiac Rehab from 11/24/2017 in [facility] Cardiac and Pulmonary Rehab  Date  11/24/17      Visit Diagnosis: Chronic stable angina ([place])  Patient's Home Medications on Admission:  Current Outpatient Medications:  .  acetaminophen (TYLENOL) 500 MG tablet, Take 2 tablets (1,000 mg total) by mouth every 8 (eight) hours as needed., Disp: 60 tablet, Rfl: 1 .  clopidogrel (PLAVIX) 75 MG tablet, Take 75 mg by mouth daily., Disp: , Rfl:  .  Coenzyme Q10 (CO Q 10) 100 MG CAPS, Take 100 mg by mouth daily., Disp: 30 capsule, Rfl: 12 .  [employer] ASPIRIN ADULT LOW DOSE 81 MG chewable tablet, CHEW 1 TABLET (81 MG TOTAL) BY MOUTH DAILY., Disp: 90 tablet, Rfl: 3 .  cyproheptadine (PERIACTIN) 4 MG tablet, Take 4 mg by mouth 3 (three) times daily as needed for allergies., Disp: , Rfl:  .  furosemide (LASIX) 20 MG tablet, Take 20 mg by mouth daily., Disp: , Rfl:  .  isosorbide mononitrate (IMDUR) 30 MG 24 hr tablet, Take 2 tablets (60 mg total) by mouth daily., Disp: 60 tablet, Rfl: 12 .  isosorbide mononitrate (IMDUR) 30 MG 24 hr tablet, TAKE 1 TABLET (30 MG TOTAL) BY MOUTH 2 (TWO) TIMES DAILY., Disp: 180 tablet, Rfl: 4 .  loperamide (IMODIUM) 2 MG capsule, Take by mouth as needed for diarrhea or loose stools., Disp: , Rfl:  .  metFORMIN (GLUCOPHAGE) 1000 MG tablet, Take 1 tablet (1,000 mg total) by mouth 2 (two) times daily with a meal., Disp: 180 tablet, Rfl: 3 .  metoprolol tartrate (LOPRESSOR) 50 MG tablet, TAKE 1 TABLET BY MOUTH TWICE A DAY FOR HIGH BLOOD PRESSURE, Disp: 60 tablet, Rfl: 12 .  nitroGLYCERIN (NITROSTAT) 0.4 MG SL tablet, Place 1 tablet (0.4 mg total) under the tongue every 5 (five) minutes as needed for chest pain., Disp:  30 tablet, Rfl: 0 .  omeprazole (PRILOSEC) 20 MG capsule, TAKE 1 CAPSULE BY MOUTH EVERY DAY, Disp: 90 capsule, Rfl: 1 .  ondansetron (ZOFRAN-ODT) 8 MG disintegrating tablet, DISSOLVE 1 TABLET ON THE TONGUE EVERY 8 HOURS AS NEEDED FOR NAUSEA OR VOMITING, Disp: 30 tablet, Rfl: 1 .  rosuvastatin (CRESTOR) 10 MG tablet, Take 1 tablet (10 mg total) by mouth daily., Disp: 90 tablet, Rfl: 3 .  sucralfate (CARAFATE) 1 g tablet, Take 1 tablet (1 g total) by mouth 2 (two) times daily., Disp: 60 tablet, Rfl: 5 .  tiZANidine (ZANAFLEX) 2 MG tablet, Take 2 mg by mouth at bedtime. , Disp: , Rfl:   Past Medical History: Past Medical History:  Diagnosis Date  . Allergy   . Anemia   . Anxiety   . Arrhythmia   . Arthritis   . Coronary artery disease   . Depression   . Diabetes mellitus without complication ([place])   . Dyspnea    doe  . Dysrhythmia   . GERD (gastroesophageal reflux disease)   . Headache   . History of hiatal hernia   . Hyperlipidemia   . Hypertension   . Myocardial infarction ([place])    2016, 04/2017  . Myocardial infarction with cardiac rehabilitation [facility])    MI 2016/ CABG 8/17    FINISHED CARDIAC  REHAB 3 WEEKS AGO  . Panic attack   . Pneumonia   . Reflux   . Stroke [facility]) 2015   showed up on MRI; no weakness noted  . TIA (transient ischemic attack)   . Voice tremor     Tobacco Use: Social History   Tobacco Use  Smoking Status Former Smoker  . Packs/day: 0.50  . Types: Cigarettes  . Last attempt to quit: 10/07/2001  . Years since quitting: 16.3  Smokeless Tobacco Never Used    Labs: Recent Review Flowsheet Data    Labs for ITP Cardiac and Pulmonary Rehab Latest Ref Rng & Units 10/01/2016 12/23/2016 03/24/2017 08/16/2017 01/12/2018   Cholestrol 100 - 199 mg/dL 259(H) - - - 167   LDLCALC 0 - 99 mg/dL 173(H) - - - 87   HDL >39 mg/dL 66 - - - 51   Trlycerides 0 - 149 mg/dL 100 - - - 145   Hemoglobin A1c 4.8 - 5.6 % 6.9(H) 7.3(H) 7.8(H) 8.0(H) 8.2(H)   PHART 7.350 - 7.450 -  - - - -   PCO2ART 35.0 - 45.0 mmHg - - - - -   HCO3 20.0 - 24.0 mEq/L - - - - -   TCO2 0 - 100 mmol/L - - - - -   O2SAT % - - - - -       Exercise Target Goals: Exercise Program Goal: Individual exercise prescription set using results from initial 6 min walk test and THRR while considering  patient's activity barriers and safety.   Exercise Prescription Goal: Initial exercise prescription builds to 30-45 minutes a day of aerobic activity, 2-3 days per week.  Home exercise guidelines will be given to patient during program as part of exercise prescription that the participant will acknowledge.  Activity Barriers & Risk Stratification: Activity Barriers & Cardiac Risk Stratification - 11/24/17 1525      Activity Barriers & Cardiac Risk Stratification   Activity Barriers  Back Problems;Deconditioning;Shortness of Breath;Chest Pain/Angina;Decreased Ventricular Function;Muscular Weakness;Other (comment);Balance Concerns    Comments  Recent TIA, still having some headaches caused by compression in cervial spine, chest pain is improving (but has not been taking her Imdur)    Cardiac Risk Stratification  High       6 Minute Walk: 6 Minute Walk    Row Name 11/24/17 1524 01/31/18 0818       6 Minute Walk   Phase  Initial  Discharge    Distance  1142 feet  1285 feet    Distance % Change  -  12.5 %    Distance Feet Change  -  143 ft    Walk Time  6 minutes  6 minutes    # of Rest Breaks  0  0    MPH  2.16  2.43    METS  3.5  3.07    RPE  9  14    Perceived Dyspnea   0  1    VO2 Peak  12.25  10.75    Symptoms  No  No    Resting HR  99 bpm  67 bpm    Resting BP  122/74  110/82    Resting Oxygen Saturation   95 %  98 %    Exercise Oxygen Saturation  during 6 min walk  98 %  99 %    Max Ex. HR  147 bpm  108 bpm    Max Ex. BP  174/88  158/74    2  Minute Post BP  134/84  134/70       Oxygen Initial Assessment:   Oxygen Re-Evaluation:   Oxygen Discharge (Final Oxygen  Re-Evaluation):   Initial Exercise Prescription: Initial Exercise Prescription - 11/24/17 1500      Date of Initial Exercise RX and Referring Provider   Date  11/24/17    Referring Provider  [name]      Treadmill   MPH  2.1    Grade  1    Minutes  15    METs  2.91      NuStep   Level  2    SPM  80    Minutes  15    METs  2.9      REL-XR   Level  2    Speed  50    Minutes  15    METs  2.9      Prescription Details   Frequency (times per week)  2    Duration  Progress to 30 minutes of continuous aerobic without signs/symptoms of physical distress      Intensity   THRR 40-80% of Max Heartrate  121-143    Ratings of Perceived Exertion  11-13    Perceived Dyspnea  0-4      Progression   Progression  Continue to progress workloads to maintain intensity without signs/symptoms of physical distress.      Resistance Training   Training Prescription  Yes    Weight  3 lbs    Reps  10-15       Perform Capillary Blood Glucose checks as needed.  Exercise Prescription Changes: Exercise Prescription Changes    Row Name 11/24/17 1500 12/12/17 1600 12/13/17 0800 12/26/17 1500 01/09/18 1600     Response to Exercise   Blood Pressure (Admit)  122/74  116/74  -  128/70  124/64   Blood Pressure (Exercise)  174/88  136/64  -  124/64  126/74   Blood Pressure (Exit)  134/84  136/70  -  118/62  128/78   Heart Rate (Admit)  99 bpm  81 bpm  -  88 bpm  73 bpm   Heart Rate (Exercise)  147 bpm  124 bpm  -  113 bpm  124 bpm   Heart Rate (Exit)  103 bpm  84 bpm  -  90 bpm  79 bpm   Oxygen Saturation (Admit)  95 %  -  -  -  -   Oxygen Saturation (Exercise)  98 %  -  -  -  -   Rating of Perceived Exertion (Exercise)  9  13  -  13  12   Perceived Dyspnea (Exercise)  0  -  -  -  -   Symptoms  none  none  -  none  none   Comments  walk test results  first full day of exercise  -  -  -   Duration  Continue with 30 min of aerobic exercise without signs/symptoms of physical  distress.  Continue with 30 min of aerobic exercise without signs/symptoms of physical distress.  -  Continue with 30 min of aerobic exercise without signs/symptoms of physical distress.  Continue with 30 min of aerobic exercise without signs/symptoms of physical distress.   Intensity  -  THRR unchanged  -  THRR unchanged  THRR unchanged     Progression   Progression  -  Continue to progress workloads to maintain intensity without signs/symptoms of physical  distress.  -  Continue to progress workloads to maintain intensity without signs/symptoms of physical distress.  Continue to progress workloads to maintain intensity without signs/symptoms of physical distress.   Average METs  -  2.95  -  2.76  4.01     Resistance Training   Training Prescription  -  Yes  -  Yes  Yes   Weight  -  3 lbs  -  3 lbs  3 lbs   Reps  -  10-15  -  10-15  10-15     Interval Training   Interval Training  -  No  -  No  No     Treadmill   MPH  -  2.1  -  -  -   Grade  -  1  -  -  -   Minutes  -  15  -  -  -   METs  -  2.91  -  -  -     NuStep   Level  -  2  -  3  3   Minutes  -  15  -  15  15   METs  -  3  -  3  4     REL-XR   Level  -  -  -  2  4   Minutes  -  -  -  15  15   METs  -  -  -  2.3  3.9     Track   Laps  -  -  -  50  63   Minutes  -  -  -  15  15   METs  -  -  -  3  4.14     Home Exercise Plan   Plans to continue exercise at  -  -  Home (comment) walking  Home (comment) walking  Home (comment) walking   Frequency  -  -  Add 3 additional days to program exercise sessions.  Add 3 additional days to program exercise sessions.  Add 3 additional days to program exercise sessions.   Initial Home Exercises Provided  -  -  12/13/17  12/13/17  12/13/17   Row Name 01/23/18 1600             Response to Exercise   Blood Pressure (Admit)  126/72       Blood Pressure (Exercise)  156/86       Blood Pressure (Exit)  132/62       Heart Rate (Admit)  84 bpm       Heart Rate (Exercise)  125 bpm        Heart Rate (Exit)  82 bpm       Rating of Perceived Exertion (Exercise)  12       Symptoms  none       Duration  Continue with 30 min of aerobic exercise without signs/symptoms of physical distress.       Intensity  THRR unchanged         Progression   Progression  Continue to progress workloads to maintain intensity without signs/symptoms of physical distress.       Average METs  4.08         Resistance Training   Training Prescription  Yes       Weight  3 lbs       Reps  10-15         Interval Training  Interval Training  No         NuStep   Level  5       Minutes  15       METs  5.2         REL-XR   Level  4       Minutes  15       METs  3.6         Track   Laps  53       Minutes  15       METs  3.45         Home Exercise Plan   Plans to continue exercise at  Home (comment) walking       Frequency  Add 3 additional days to program exercise sessions.       Initial Home Exercises Provided  12/13/17          Exercise Comments: Exercise Comments    Row Name 12/06/17 0834 01/31/18 0825         Exercise Comments  First full day of exercise!  Patient was oriented to gym and equipment including functions, settings, policies, and procedures.  Patient's individual exercise prescription and treatment plan were reviewed.  All starting workloads were established based on the results of the 6 minute walk test done at initial orientation visit.  The plan for exercise progression was also introduced and progression will be customized based on patient's performance and goals.  Dicie fell after the first minute of the walk test.  She stumbled at the door with the carpet to tile transition. She then tried to catch herself on the door and then to her knees and then she laid down for a minute to catch her breath and assess what happened.  She was helped back to up to lean against wall.  Eventually she was able to get up on her own to the chair.  Blood pressure was 136/78.  She skinned  her knees a little and elbow feels sore from hitting door.  She was given some lotion for her knees and ice packs.  She chose to walk again after about 64mn of rest.  She was able to walk without a problem but her knees did hurt at the end (6/10).  She was able to finish class today and used the NuStep with ice on her knees.  She states that she feels fine and is planning to go to her appointment with podiatry after class and will let u[place]now if anything else comes up.          Exercise Goals and Review: Exercise Goals    Row Name 11/24/17 1530             Exercise Goals   Increase Physical Activity  Yes       Intervention  Provide advice, education, support and counseling about physical activity/exercise needs.;Develop an individualized exercise prescription for aerobic and resistive training based on initial evaluation findings, risk stratification, comorbidities and participant's personal goals.       Expected Outcomes  Short Term: Attend rehab on a regular basis to increase amount of physical activity.;Long Term: Add in home exercise to make exercise part of routine and to increase amount of physical activity.;Long Term: Exercising regularly at least 3-5 days a week.       Increase Strength and Stamina  Yes       Intervention  Provide advice, education, support and counseling about physical activity/exercise needs.;Develop  an individualized exercise prescription for aerobic and resistive training based on initial evaluation findings, risk stratification, comorbidities and participant's personal goals.       Expected Outcomes  Short Term: Increase workloads from initial exercise prescription for resistance, speed, and METs.;Short Term: Perform resistance training exercises routinely during rehab and add in resistance training at home;Long Term: Improve cardiorespiratory fitness, muscular endurance and strength as measured by increased METs and functional capacity (6MWT)       Able to  understand and use rate of perceived exertion (RPE) scale  Yes       Intervention  Provide education and explanation on how to use RPE scale       Expected Outcomes  Short Term: Able to use RPE daily in rehab to express subjective intensity level;Long Term:  Able to use RPE to guide intensity level when exercising independently       Able to understand and use Dyspnea scale  Yes       Intervention  Provide education and explanation on how to use Dyspnea scale       Expected Outcomes  Short Term: Able to use Dyspnea scale daily in rehab to express subjective sense of shortness of breath during exertion;Long Term: Able to use Dyspnea scale to guide intensity level when exercising independently       Knowledge and understanding of Target Heart Rate Range (THRR)  Yes       Intervention  Provide education and explanation of THRR including how the numbers were predicted and where they are located for reference       Expected Outcomes  Short Term: Able to use daily as guideline for intensity in rehab;Short Term: Able to state/look up THRR;Long Term: Able to use THRR to govern intensity when exercising independently       Able to check pulse independently  Yes       Intervention  Provide education and demonstration on how to check pulse in carotid and radial arteries.;Review the importance of being able to check your own pulse for safety during independent exercise       Expected Outcomes  Short Term: Able to explain why pulse checking is important during independent exercise;Long Term: Able to check pulse independently and accurately       Understanding of Exercise Prescription  Yes       Intervention  Provide education, explanation, and written materials on patient's individual exercise prescription       Expected Outcomes  Short Term: Able to explain program exercise prescription;Long Term: Able to explain home exercise prescription to exercise independently          Exercise Goals Re-Evaluation  : Exercise Goals Re-Evaluation    Row Name 12/06/17 0834 12/13/17 0819 12/20/17 0824 12/26/17 1555 01/09/18 1605     Exercise Goal Re-Evaluation   Exercise Goals Review  Increase Physical Activity;Increase Strength and Stamina;Able to understand and use rate of perceived exertion (RPE) scale;Knowledge and understanding of Target Heart Rate Range (THRR);Understanding of Exercise Prescription  Increase Physical Activity;Increase Strength and Stamina;Able to understand and use rate of perceived exertion (RPE) scale;Knowledge and understanding of Target Heart Rate Range (THRR);Understanding of Exercise Prescription;Able to check pulse independently  Increase Physical Activity;Increase Strength and Stamina;Understanding of Exercise Prescription  Increase Physical Activity;Increase Strength and Stamina;Understanding of Exercise Prescription  Increase Physical Activity;Increase Strength and Stamina;Understanding of Exercise Prescription   Comments  Reviewed RPE scale, THR and program prescription with pt today.  Pt voiced understanding and was given a  copy of goals to take home.   Reviewed home exercise with pt today.  Pt plans to walk at home for exercise.  Reviewed THR, pulse, RPE, sign and symptoms, NTG use, and when to call 911 or MD.  Also discussed weather considerations and indoor options.  Pt voiced understanding.  Candace [name] has been doing well in rehab. She is walking 3 days a week at home for 5mn.  This past week she went Friday, Saturday, and Sunday.  She feels like her strength and stamina are starting to come back too!!    GNeytiricontinues to do well in rehab. She has moved from the treadmill back to the track as she really enjoys dancing around!  We will continue to monitor her progress.   Candace [name] started to attend rehab three days a week (Tues/Wed/Thurs).  She is up to 68 laps on the track.  We will continue to monitor her progression.    Expected Outcomes  Short: Use RPE daily to regulate intensity. Long:  Follow program prescription in THR.  Short: Start walking at home on own.  Long: Continue to build strength and stamina.   Short: Continue to increase walking at home.  Long: Continue to work on bAnimator   Short: Increase level on XR.  Long: Continue to walk more at home.   Short: Continue to increase workloads.  Long: Continue to increase activity levels.    RR[place]ard[place]Name 01/12/18 0820 01/23/18 1628           Exercise Goal Re-Evaluation   Exercise Goals Review  Increase Physical Activity;Increase Strength and Stamina;Understanding of Exercise Prescription  Increase Physical Activity;Increase Strength and Stamina;Understanding of Exercise Prescription      Comments  Candace [name] doing well in rehab.  She is busy dancing around the track.  She is starting to get her strength and stamina back.  She is walking at home everyday!!  Candace [name] to do well in rehab.  She tried to get back up on the treadmill but was walking slower than she dances around the track so she went back to the track.  She is also up to 5.2 METs on the NuStep.  We will continue to monitor her progress.       Expected Outcomes  Short: Continue to add in walking at home  Long: Continue to increase strength and stamina.   Short: Increase workload on XR.  Long: Continue to improve strength and stamina as she wants to work and drive again.          Discharge Exercise Prescription (Final Exercise Prescription Changes): Exercise Prescription Changes - 01/23/18 1600      Response to Exercise   Blood Pressure (Admit)  126/72    Blood Pressure (Exercise)  156/86    Blood Pressure (Exit)  132/62    Heart Rate (Admit)  84 bpm    Heart Rate (Exercise)  125 bpm    Heart Rate (Exit)  82 bpm    Rating of Perceived Exertion (Exercise)  12    Symptoms  none    Duration  Continue with 30 min of aerobic exercise without signs/symptoms of physical distress.    Intensity  THRR unchanged      Progression   Progression  Continue  to progress workloads to maintain intensity without signs/symptoms of physical distress.    Average METs  4.08      Resistance Training   Training Prescription  Yes    Weight  3 lbs    Reps  10-15      Interval Training   Interval Training  No      NuStep   Level  5    Minutes  15    METs  5.2      REL-XR   Level  4    Minutes  15    METs  3.6      Track   Laps  53    Minutes  15    METs  3.45      Home Exercise Plan   Plans to continue exercise at  Home (comment)   walking   Frequency  Add 3 additional days to program exercise sessions.    Initial Home Exercises Provided  12/13/17       Nutrition:  Target Goals: Understanding of nutrition guidelines, daily intake of sodium <1553m, cholesterol <2034m calories 30% from fat and 7% or less from saturated fats, daily to have 5 or more servings of fruits and vegetables.  Biometrics: Pre Biometrics - 11/24/17 1532      Pre Biometrics   Height  5' 2" (1.575 m)    Weight  169 lb 4.8 oz (76.8 kg)    Waist Circumference  36.5 inches    Hip Circumference  40.25 inches    Waist to Hip Ratio  0.91 %    BMI (Calculated)  30.96    Single Leg Stand  5.78 seconds      Post Biometrics - 01/31/18 0819       Post  Biometrics   Height  5' 2" (1.575 m)    Weight  174 lb (78.9 kg)    Waist Circumference  37 inches    Hip Circumference  42 inches    Waist to Hip Ratio  0.88 %    BMI (Calculated)  31.82    Single Leg Stand  8.9 seconds       Nutrition Therapy Plan and Nutrition Goals: Nutrition Therapy & Goals - 12/06/17 0847      Nutrition Therapy   Diet  DM/ TLC    Protein (specify units)  11oz    Fiber  25 grams    Whole Grain Foods  3 servings    Saturated Fats  14 max. grams    Fruits and Vegetables  5 servings/day   8 ideal, working to increase fruit and vegetable intake   Sodium  1500 grams      Personal Nutrition Goals   Nutrition Goal  Because you typically eat 1-2 meals per day, it may be a good idea to  consume Glucerna nutritional drinks on a more consistent basis as a meal replacement or snack between meals.    Personal Goal #2  Continue to monitor sodium intake and choose foods with less than 14095mf sodium per serving    Personal Goal #3  Prioritize protein at meal times. Previously, you mentioned that dairy may be most feasible for you to eat reagularly. For example, drink a glass of milk or have a yogurt or cheese and crackers as a snack    Comments  She continues to c/o decreased appetite but feels that she has been doing well overall with diet. Her BG readings typically stay less than 200 and above 100. She is consuming some dairy and drinks Glucerna shakes "occasionally"      Intervention Plan   Intervention  Prescribe, educate and counsel regarding individualized specific dietary modifications aiming towards targeted core components  such as weight, hypertension, lipid management, diabetes, heart failure and other comorbidities.    Expected Outcomes  Short Term Goal: Understand basic principles of dietary content, such as calories, fat, sodium, cholesterol and nutrients.;Short Term Goal: A plan has been developed with personal nutrition goals set during dietitian appointment.;Long Term Goal: Adherence to prescribed nutrition plan.       Nutrition Assessments: Nutrition Assessments - 01/26/18 1013      MEDFICTS Scores   Pre Score  6    Post Score  27    Score Difference  21       Nutrition Goals Re-Evaluation: Nutrition Goals Re-Evaluation    Row Name 12/06/17 0855 01/03/18 0819           Goals   Nutrition Goal  Because you typically eat 1-2 meals per day, it may be a good idea to consume Glucerna nutritional drinks on a more consistent basis as a meal replacement or snack between meals.  Consider drinking Glucerna nutritional drinks more consistently as a meal replacement d/t typically eating 1-2 meals/day for better BG control; monitor sodium intake more closely and aim to  choose low sodium options; prioritize protein at meal times      Comment  She c/o decreased appetite but is a diabetic, requiring her to eat on a consistent schedule to best manage blood sugars  She continues to eat 1-2 meals a day, usually 1, and is drinking Glucerna shakes occasionally but not consistently. She is not monitoring sodium at this time but has been trying to focus on protein intake at her main meal of the day. She eats proteins like dairy products and beans once per week on average. Feels that her diet primarily consists of fruits and vegetables. She feels she is doing better overall health-wise      Expected Outcome  She will include Glucerna shakes on a more consistent basis to help better meet nutritional needs and manage BG   She will work to be more consistent with meal/snack intake and to be mindful of the amount of sodium she is consuming. Continue to eat a variety of fruits and vegetables and to priortize protein intake at her main meal        Personal Goal #2 Re-Evaluation   Personal Goal #2  Continue to monitor sodium intake and choose foods with less than 134m of sodium per serving  -        Personal Goal #3 Re-Evaluation   Personal Goal #3  Prioritize protein at meal times. Previously, you mentioned that diary may be most feasible for you to eat regularly. For example, drink a glass of milk or have a yogurt or cheese and crackers as a snack  -         Nutrition Goals Discharge (Final Nutrition Goals Re-Evaluation): Nutrition Goals Re-Evaluation - 01/03/18 0819      Goals   Nutrition Goal  Consider drinking Glucerna nutritional drinks more consistently as a meal replacement d/t typically eating 1-2 meals/day for better BG control; monitor sodium intake more closely and aim to choose low sodium options; prioritize protein at meal times    Comment  She continues to eat 1-2 meals a day, usually 1, and is drinking Glucerna shakes occasionally but not consistently. She is not  monitoring sodium at this time but has been trying to focus on protein intake at her main meal of the day. She eats proteins like dairy products and beans once per week on average. Feels  that her diet primarily consists of fruits and vegetables. She feels she is doing better overall health-wise    Expected Outcome  She will work to be more consistent with meal/snack intake and to be mindful of the amount of sodium she is consuming. Continue to eat a variety of fruits and vegetables and to priortize protein intake at her main meal       Psychosocial: Target Goals: Acknowledge presence or absence of significant depression and/or stress, maximize coping skills, provide positive support system. Participant is able to verbalize types and ability to use techniques and skills needed for reducing stress and depression.   Initial Review & Psychosocial Screening: Initial Psych Review & Screening - 11/24/17 1533      Initial Review   Current issues with  History of Depression;Current Sleep Concerns;Current Stress Concerns    Source of Stress Concerns  Chronic Illness;Transportation;Unable to perform yard/household activities;Unable to participate in former interests or hobbies;Occupation    Comments  Candace [name] wants to go back to work.  She is still not able to drive but has appointment to review this on 12/08/17.  Her family has been taking her places and she is using lyft with insurance to get to some appointments. She has been sleeping better since last here, but still not where she wants to be.  She currently denies an depression symptoms.       Family Dynamics   Good Support System?  Yes    Comments  Candace [name]'s family is her support system.  Her sister brought her to orientation today.       Barriers   Psychosocial barriers to participate in program  The patient should benefit from training in stress management and relaxation.;Psychosocial barriers identified (see note)      Screening Interventions    Interventions  Encouraged to exercise;Program counselor consult;Provide feedback about the scores to participant    Expected Outcomes  Short Term goal: Utilizing psychosocial counselor, staff and physician to assist with identification of specific Stressors or current issues interfering with healing process. Setting desired goal for each stressor or current issue identified.;Long Term Goal: Stressors or current issues are controlled or eliminated.;Long Term goal: The participant improves quality of Life and PHQ9 Scores as seen by post scores and/or verbalization of changes;Short Term goal: Identification and review with participant of any Quality of Life or Depression concerns found by scoring the questionnaire.       Quality of Life Scores:  Quality of Life - 01/26/18 1012      Quality of Life   Select  Quality of Life      Quality of Life Scores   Health/Function Pre  17.43 %    Health/Function Post  19.92 %    Health/Function % Change  14.29 %    Socioeconomic Pre  15.71 %    Socioeconomic Post  17.29 %    Socioeconomic % Change   10.06 %    Psych/Spiritual Pre  23 %    Psych/Spiritual Post  24.5 %    Psych/Spiritual % Change  6.52 %    Family Pre  18.38 %    Family Post  25.13 %    Family % Change  36.72 %    GLOBAL Pre  18.39 %    GLOBAL Post  21.07 %    GLOBAL % Change  14.57 %      Scores of 19 and below usually indicate a poorer quality of life in these areas.  A difference  of  2-3 points is a clinically meaningful difference.  A difference of 2-3 points in the total score of the Quality of Life Index has been associated with significant improvement in overall quality of life, self-image, physical symptoms, and general health in studies assessing change in quality of life.  PHQ-9: Recent Review Flowsheet Data    Depression screen [facility] 2/9 01/26/2018 12/13/2017 11/24/2017 08/04/2017 06/02/2017   Decreased Interest 1 0 0 1 2   Down, Depressed, Hopeless 0 0 0 0 1   PHQ - 2 Score 1 0  0 1 3   Altered sleeping 1 0 _0 Tired, decreased energy _1 Change in appetite _2 0 2   Feeling bad or failure about yourself  0 0 0 0 1   Trouble concentrating 0 0 0 0 1   Moving slowly or fidgety/restless _3 Suicidal thoughts 0 0 0 0 0   PHQ-9 Score _4 Difficult doing work/chores Somewhat difficult Not difficult at all Not difficult at all Not difficult at all Not difficult at all     Interpretation of Total Score  Total Score Depression Severity:  1-4 = Minimal depression, 5-9 = Mild depression, 10-14 = Moderate depression, 15-19 = Moderately severe depression, 20-27 = Severe depression   Psychosocial Evaluation and Intervention: Psychosocial Evaluation - 12/13/17 0917      Psychosocial Evaluation & Interventions   Interventions  Stress management education;Encouraged to exercise with the program and follow exercise prescription    Comments  Ms. Batie Abner Greenspan) has returned to this program subsequent to several medical procedures this year that have decreased her energy.  She is a 67 year old who lives with her daughters and is actively involved in her church.  She has multiple health issues with diabetes and chronic headaches in addition to a recent hysterectomy and the heart attack this past February.  She reports sleeping better now with the use of medications (for her headaches) and her appetite is up and down.  Samauri reports having a history of depression but is no longer experiencing those symptoms nor is on any medications for this.  Her mood is typically positive.  She states her current living condition is her primary stressor along with her medical limitations - including not being able to drive herself.  She has goals to get her strength back; her "life back to normal" and may even go back to work in some capacity.  Staff will be following with Abner Greenspan.      Expected Outcomes  Short:  Samyrah will exercise consistently to regain her strength.  Azalie will  learn positive coping strategies to manage the stress in her life.  She will also meet with the dietician re: her appetite concerns.  Long:  Cresta will develop a positive self-care routine of diet and exercise to maintain her health and mental health.      Continue Psychosocial Services   Follow up required by staff       Psychosocial Re-Evaluation: Psychosocial Re-Evaluation    Candace [name] Name 12/09/17 1058 12/20/17 0811 01/12/18 0827         Psychosocial Re-Evaluation   Current issues with  Current Stress Concerns  Current Stress Concerns;Current Sleep Concerns  Current Stress Concerns;Current Sleep Concerns     Comments  Teresina left message to call her back.  She is very overwhelmed with her limitations.  She would like to return to work and graduate early.  She would like to add in a third day. She is trying to work with her kids to get here.  She has been cleared to drive to store but feels this is such a limitation.  She got very emotional on the phone and wants to know what she can do to get to the doctor to clear her to drive sooner.  She is doing what she can but is very frustrated by everything.  She was saying she is very upset with her doctor as well as her daughter.  We will help her look into alternative means of transportation as well.   Candace [name] is doing better mentally.  She has come terms about not driving.  She has started to use Lyft with her Medicare today. She is going to try this and see how it goes.  She has made up with her daughter but still does not like that her daughter continues to give her instructions on what to do.  She does have a follow up in [place] with her neurologist again.  She is enjoying come to class and is starting to feel better overall. She did not sleep well over the weekend as her mind keeps running.   We talked about using some relaxation techniques to help relax her mind prior to going to sleep.   Candace [name] is back to driving some.  She is able to drive herself to class and  doctor's appointments.  She is still not allowed to drive at night.   She is sleeping well and is doing better with her daughter.      Expected Outcomes  Short: Look into other means of transportation.  Long: Continue to take the steps needed to return to driving.   Short: Continue to work with [employer] and try some relaxation techniques.  Long: Continue to cope positively.  Short: Continue to get to work toward full driving and exercise.  Long: Continue to cope positively.      Interventions  Stress management education;Encouraged to attend Cardiac Rehabilitation for the exercise  Stress management education;Encouraged to attend Cardiac Rehabilitation for the exercise  Encouraged to attend Cardiac Rehabilitation for the exercise     Continue Psychosocial Services   Follow up required by staff  Follow up required by staff  Follow up required by staff     Comments  Doctor only cleared her to drive back and forth to grocery store. She wants her independence back.   Doctor only cleared her to drive back and forth to grocery store. She wants her independence back.   -       Initial Review   Source of Stress Concerns  Chronic Illness;Transportation;Unable to perform yard/household activities;Unable to participate in former interests or hobbies;Occupation  Chronic Illness;Transportation;Unable to perform yard/household activities;Unable to participate in former interests or hobbies;Occupation  -        Psychosocial Discharge (Final Psychosocial Re-Evaluation): Psychosocial Re-Evaluation - 01/12/18 0827      Psychosocial Re-Evaluation   Current issues with  Current Stress Concerns;Current Sleep Concerns    Comments  Candace [name] is back to driving some.  She is able to drive herself to class and doctor's appointments.  She is still not allowed to drive at night.   She is sleeping well and is doing better with her daughter.     Expected Outcomes  Short: Continue to get to work toward full driving and exercise.  Long:  Continue  to cope positively.     Interventions  Encouraged to attend Cardiac Rehabilitation for the exercise    Continue Psychosocial Services   Follow up required by staff       Vocational Rehabilitation: Provide vocational rehab assistance to qualifying candidates.   Vocational Rehab Evaluation & Intervention: Vocational Rehab - 11/24/17 1538      Initial Vocational Rehab Evaluation & Intervention   Assessment shows need for Vocational Rehabilitation  No       Education: Education Goals: Education classes will be provided on a variety of topics geared toward better understanding of heart health and risk factor modification. Participant will state understanding/return demonstration of topics presented as noted by education test scores.  Learning Barriers/Preferences: Learning Barriers/Preferences - 11/24/17 1537      Learning Barriers/Preferences   Learning Barriers  None    Learning Preferences  None       Education Topics:  AED/CPR: - Group verbal and written instruction with the use of models to demonstrate the basic use of the AED with the basic ABC's of resuscitation.   Cardiac Rehab from 01/26/2018 in [facility] Cardiac and Pulmonary Rehab  Date  01/26/18  Educator  MA  Instruction Review Code  1- Verbalizes Understanding      General Nutrition Guidelines/Fats and Fiber: -Group instruction provided by verbal, written material, models and posters to present the general guidelines for heart healthy nutrition. Gives an explanation and review of dietary fats and fiber.   Cardiac Rehab from 04/08/2016 in [facility] Cardiac and Pulmonary Rehab  Date  04/06/16  Educator  PI  Instruction Review Code (retired)  2- meets goals/outcomes      Controlling Sodium/Reading Food Labels: -Group verbal and written material supporting the discussion of sodium use in heart healthy nutrition. Review and explanation with models, verbal and written materials for utilization of the food label.    Cardiac Rehab from 01/26/2018 in [facility] Cardiac and Pulmonary Rehab  Date  12/15/17  Educator  LB  Instruction Review Code  1- Verbalizes Understanding      Exercise Physiology & General Exercise Guidelines: - Group verbal and written instruction with models to review the exercise physiology of the cardiovascular system and associated critical values. Provides general exercise guidelines with specific guidelines to those with heart or lung disease.    Cardiac Rehab from 08/04/2017 in [facility] Cardiac and Pulmonary Rehab  Date  07/07/17  Educator  [facility]  Instruction Review Code  1- Verbalizes Understanding      Aerobic Exercise & Resistance Training: - Gives group verbal and written instruction on the various components of exercise. Focuses on aerobic and resistive training programs and the benefits of this training and how to safely progress through these programs..   Cardiac Rehab from 08/04/2017 in [facility] Cardiac and Pulmonary Rehab  Date  07/12/17  Educator  AS  Instruction Review Code  1- Verbalizes Understanding      Flexibility, Balance, Mind/Body Relaxation: Provides group verbal/written instruction on the benefits of flexibility and balance training, including mind/body exercise modes such as yoga, pilates and tai chi.  Demonstration and skill practice provided.   Cardiac Rehab from 01/26/2018 in [facility] Cardiac and Pulmonary Rehab  Date  01/03/18  Educator  AS  Instruction Review Code  1- Verbalizes Understanding      Stress and Anxiety: - Provides group verbal and written instruction about the health risks of elevated stress and causes of high stress.  Discuss the correlation between heart/lung disease and anxiety and treatment  options. Review healthy ways to manage with stress and anxiety.   Cardiac Rehab from 01/26/2018 in [facility] Cardiac and Pulmonary Rehab  Date  01/11/18  Educator  [facility]  Instruction Review Code  1- Verbalizes Understanding      Depression: - Provides group verbal  and written instruction on the correlation between heart/lung disease and depressed mood, treatment options, and the stigmas associated with seeking treatment.   Cardiac Rehab from 01/26/2018 in [facility] Cardiac and Pulmonary Rehab  Date  12/28/17  Educator  [facility]  Instruction Review Code  5- Refused Teaching      Anatomy & Physiology of the Heart: - Group verbal and written instruction and models provide basic cardiac anatomy and physiology, with the coronary electrical and arterial systems. Review of Valvular disease and Heart Failure   Cardiac Rehab from 01/26/2018 in [facility] Cardiac and Pulmonary Rehab  Date  01/12/18  Educator  CE  Instruction Review Code  1- Verbalizes Understanding      Cardiac Procedures: - Group verbal and written instruction to review commonly prescribed medications for heart disease. Reviews the medication, class of the drug, and side effects. Includes the steps to properly store meds and maintain the prescription regimen. (beta blockers and nitrates)   Cardiac Rehab from 08/04/2017 in [facility] Cardiac and Pulmonary Rehab  Date  06/23/17  Educator  CE  Instruction Review Code  1- Verbalizes Understanding      Cardiac Medications I: - Group verbal and written instruction to review commonly prescribed medications for heart disease. Reviews the medication, class of the drug, and side effects. Includes the steps to properly store meds and maintain the prescription regimen.   Cardiac Rehab from 01/26/2018 in [facility] Cardiac and Pulmonary Rehab  Date  01/17/18  Educator  SB  Instruction Review Code  1- Verbalizes Understanding      Cardiac Medications II: -Group verbal and written instruction to review commonly prescribed medications for heart disease. Reviews the medication, class of the drug, and side effects. (all other drug classes)   Cardiac Rehab from 01/26/2018 in [facility] Cardiac and Pulmonary Rehab  Date  01/05/18  Educator  CE  Instruction Review Code  1- Verbalizes  Understanding       Go Sex-Intimacy & Heart Disease, Get SMART - Goal Setting: - Group verbal and written instruction through game format to discuss heart disease and the return to sexual intimacy. Provides group verbal and written material to discuss and apply goal setting through the application of the S.M.A.R.T. Method.   Cardiac Rehab from 08/04/2017 in [facility] Cardiac and Pulmonary Rehab  Date  06/23/17  Educator  CE  Instruction Review Code  1- Verbalizes Understanding      Other Matters of the Heart: - Provides group verbal, written materials and models to describe Stable Angina and Peripheral Artery. Includes description of the disease process and treatment options available to the cardiac patient.   Cardiac Rehab from 01/26/2018 in [facility] Cardiac and Pulmonary Rehab  Date  01/12/18  Educator  CE  Instruction Review Code  1- Verbalizes Understanding      Exercise & Equipment Safety: - Individual verbal instruction and demonstration of equipment use and safety with use of the equipment.   Cardiac Rehab from 01/26/2018 in [facility] Cardiac and Pulmonary Rehab  Date  11/24/17  Educator   [facility]  Instruction Review Code  1- Verbalizes Understanding      Infection Prevention: - Provides verbal and written material to individual with discussion of infection control including  proper hand washing and proper equipment cleaning during exercise session.   Cardiac Rehab from 01/26/2018 in [facility] Cardiac and Pulmonary Rehab  Date  11/24/17  Educator  [facility]  Instruction Review Code  1- Verbalizes Understanding      Falls Prevention: - Provides verbal and written material to individual with discussion of falls prevention and safety.   Cardiac Rehab from 01/26/2018 in [facility] Cardiac and Pulmonary Rehab  Date  11/24/17  Educator  [facility]  Instruction Review Code  1- Verbalizes Understanding      Diabetes: - Individual verbal and written instruction to review signs/symptoms of diabetes, desired ranges  of glucose level fasting, after meals and with exercise. Acknowledge that pre and post exercise glucose checks will be done for 3 sessions at entry of program.   Cardiac Rehab from 01/26/2018 in [facility] Cardiac and Pulmonary Rehab  Date  11/24/17  Educator  [facility]  Instruction Review Code  1- Verbalizes Understanding      Know Your Numbers and Risk Factors: -Group verbal and written instruction about important numbers in your health.  Discussion of what are risk factors and how they play a role in the disease process.  Review of Cholesterol, Blood Pressure, Diabetes, and BMI and the role they play in your overall health.   Cardiac Rehab from 01/26/2018 in [facility] Cardiac and Pulmonary Rehab  Date  01/05/18  Educator  CE  Instruction Review Code  1- Verbalizes Understanding      Sleep Hygiene: -Provides group verbal and written instruction about how sleep can affect your health.  Define sleep hygiene, discuss sleep cycles and impact of sleep habits. Review good sleep hygiene tips.    Other: -Provides group and verbal instruction on various topics (see comments)   Knowledge Questionnaire Score: Knowledge Questionnaire Score - 01/26/18 1012      Knowledge Questionnaire Score   Pre Score  24/26    Post Score  26/26       Core Components/Risk Factors/Patient Goals at Admission: Personal Goals and Risk Factors at Admission - 11/24/17 1539      Core Components/Risk Factors/Patient Goals on Admission    Weight Management  Yes;Obesity;Weight Loss    Intervention  Weight Management: Develop a combined nutrition and exercise program designed to reach desired caloric intake, while maintaining appropriate intake of nutrient and fiber, sodium and fats, and appropriate energy expenditure required for the weight goal.;Weight Management: Provide education and appropriate resources to help participant work on and attain dietary goals.;Weight Management/Obesity: Establish reasonable short term and long  term weight goals.;Obesity: Provide education and appropriate resources to help participant work on and attain dietary goals.    Admit Weight  169 lb 4.8 oz (76.8 kg)    Goal Weight: Short Term  159 lb (72.1 kg)    Goal Weight: Long Term  145 lb (65.8 kg)    Expected Outcomes  Short Term: Continue to assess and modify interventions until short term weight is achieved;Long Term: Adherence to nutrition and physical activity/exercise program aimed toward attainment of established weight goal;Weight Loss: Understanding of general recommendations for a balanced deficit meal plan, which promotes 1-2 lb weight loss per week and includes a negative energy balance of [phone] kcal/d;Understanding recommendations for meals to include 15-35% energy as protein, 25-35% energy from fat, 35-60% energy from carbohydrates, less than 289m of dietary cholesterol, 20-35 gm of total fiber daily;Understanding of distribution of calorie intake throughout the day with the consumption of 4-5 meals/snacks    Diabetes  Yes  Intervention  Provide education about signs/symptoms and action to take for hypo/hyperglycemia.;Provide education about proper nutrition, including hydration, and aerobic/resistive exercise prescription along with prescribed medications to achieve blood glucose in normal ranges: Fasting glucose 65-99 mg/dL    Expected Outcomes  Short Term: Participant verbalizes understanding of the signs/symptoms and immediate care of hyper/hypoglycemia, proper foot care and importance of medication, aerobic/resistive exercise and nutrition plan for blood glucose control.;Long Term: Attainment of HbA1C < 7%.    Heart Failure  Yes    Intervention  Provide a combined exercise and nutrition program that is supplemented with education, support and counseling about heart failure. Directed toward relieving symptoms such as shortness of breath, decreased exercise tolerance, and extremity edema.    Expected Outcomes  Improve  functional capacity of life;Short term: Attendance in program 2-3 days a week with increased exercise capacity. Reported lower sodium intake. Reported increased fruit and vegetable intake. Reports medication compliance.;Short term: Daily weights obtained and reported for increase. Utilizing diuretic protocols set by physician.;Long term: Adoption of self-care skills and reduction of barriers for early signs and symptoms recognition and intervention leading to self-care maintenance.    Hypertension  Yes    Intervention  Provide education on lifestyle modifcations including regular physical activity/exercise, weight management, moderate sodium restriction and increased consumption of fresh fruit, vegetables, and low fat dairy, alcohol moderation, and smoking cessation.;Monitor prescription use compliance.    Expected Outcomes  Short Term: Continued assessment and intervention until BP is < 140/42m HG in hypertensive participants. < 130/834mHG in hypertensive participants with diabetes, heart failure or chronic kidney disease.;Long Term: Maintenance of blood pressure at goal levels.    Lipids  Yes    Intervention  Provide education and support for participant on nutrition & aerobic/resistive exercise along with prescribed medications to achieve LDL <7027mHDL >55m62m  Expected Outcomes  Short Term: Participant states understanding of desired cholesterol values and is compliant with medications prescribed. Participant is following exercise prescription and nutrition guidelines.;Long Term: Cholesterol controlled with medications as prescribed, with individualized exercise RX and with personalized nutrition plan. Value goals: LDL < 70mg71mL > 40 mg.       Core Components/Risk Factors/Patient Goals Review:  Goals and Risk Factor Review    Row Name 12/20/17 0816 01/12/18 0831           Core Components/Risk Factors/Patient Goals Review   Personal Goals Review  Weight  Management/Obesity;Hypertension;Diabetes;Lipids;Heart Failure  Weight Management/Obesity;Hypertension;Diabetes;Lipids;Heart Failure      Review  Candace [name] iJaneceoing well in rehab. Her weight has been holding steady and she does weigh everyday. She has not had any heart failure symtpoms, denies swelling and SOB.  She has been taking her mediations and her numbers have been looking pretty good for her blood pressures and blood sugars.  She has been checking them occasionally at home for pressure but not her sugars.  We talked about that if she wants to come off her meds, she needs to be keeping records of her numbers.  She agreed to start checking more frequently and to record it.  She will continue to check her numbres daily.  Lateria continues to do well in rehab.  She has been maintaining her weight and denies heart failure symptoms.  She is doing well with her blood pressures and blood sugars in generally.  Some days she runs a little high, but overall she is doing well.  She still wants to come off of her meds, but she knows  that they are helping.  She has been recording her numbers a little more frequently.       Expected Outcomes  Short: Check blood pressures and blood sugars more frequently.  Long: Continue to take medications and monitor heart failure symtoms.   Short: Continue to check more frequently and record it.  Long: Continue to take medications and work on heart failure.          Core Components/Risk Factors/Patient Goals at Discharge (Final Review):  Goals and Risk Factor Review - 01/12/18 0831      Core Components/Risk Factors/Patient Goals Review   Personal Goals Review  Weight Management/Obesity;Hypertension;Diabetes;Lipids;Heart Failure    Review  Candace [name] continues to do well in rehab.  She has been maintaining her weight and denies heart failure symptoms.  She is doing well with her blood pressures and blood sugars in generally.  Some days she runs a little high, but overall she is doing well.  She  still wants to come off of her meds, but she knows that they are helping.  She has been recording her numbers a little more frequently.     Expected Outcomes  Short: Continue to check more frequently and record it.  Long: Continue to take medications and work on heart failure.        ITP Comments: ITP Comments    Row Name 11/24/17 1231 11/24/17 1519 12/07/17 0623 01/04/18 0754 01/31/18 0826   ITP Comments  Candace [name] does not want to be on any medications and has stopped taking her medications.  Her heart rate was elevated today since she hasn't taken her .  We talked about her needing to take medications for her safety and will required to take them to attend rehab. She promises to take her heart meds and diabetes.   Medical evaluation completed.  Documentation can be found in [facility] encounter 10/29/17.  Initial ITP created and sent for review to Dr. Emily Filbert, Medical Director of Cardiac Rehab.   30 day review completed. ITP sent to Dr. Emily Filbert, Medical Director of Cardiac Rehab. Continue with ITP unless changes are made by physician  New to program  30 day review completed. ITP sent to Dr. Emily Filbert, Medical Director of Cardiac Rehab. Continue with ITP unless changes are made by physician  Candace [name] fell after the first minute of the walk test.  She stumbled at the door with the carpet to tile transition. She then tried to catch herself on the door and then to her knees and then she laid down for a minute to catch her breath and assess what happened.  She was helped back to up to lean against wall.  Eventually she was able to get up on her own to the chair.  Blood pressure was 136/78.  She skinned her knees a little and elbow feels sore from hitting door.  She was given some lotion for her knees and ice packs.  She chose to walk again after about 65mn of rest.  She was able to walk without a problem but her knees did hurt at the end (6/10).  She was able to finish class today and used the NuStep with ice on her  knees.  She states that she feels fine and is planning to go to her appointment with podiatry after class and will let [place] if anything else comes up.    Candace [name] 02/01/18 0553           ITP Comments  30  day review. Continue with ITP unless direccted changes per Medical Director Chart Review.          Comments:

## 2018-02-02 ENCOUNTER — Encounter: Payer: Self-pay | Admitting: Family Medicine

## 2018-02-02 ENCOUNTER — Encounter: Payer: Medicare Other | Admitting: *Deleted

## 2018-02-02 ENCOUNTER — Ambulatory Visit (INDEPENDENT_AMBULATORY_CARE_PROVIDER_SITE_OTHER): Payer: Medicare Other | Admitting: Family Medicine

## 2018-02-02 VITALS — BP 132/78 | HR 64 | Temp 98.0°F | Resp 16 | Ht 62.0 in | Wt 175.0 lb

## 2018-02-02 DIAGNOSIS — I208 Other forms of angina pectoris: Secondary | ICD-10-CM

## 2018-02-02 DIAGNOSIS — I25708 Atherosclerosis of coronary artery bypass graft(s), unspecified, with other forms of angina pectoris: Secondary | ICD-10-CM | POA: Diagnosis not present

## 2018-02-02 DIAGNOSIS — E118 Type 2 diabetes mellitus with unspecified complications: Secondary | ICD-10-CM

## 2018-02-02 DIAGNOSIS — F418 Other specified anxiety disorders: Secondary | ICD-10-CM

## 2018-02-02 DIAGNOSIS — I10 Essential (primary) hypertension: Secondary | ICD-10-CM | POA: Diagnosis not present

## 2018-02-02 DIAGNOSIS — R5382 Chronic fatigue, unspecified: Secondary | ICD-10-CM

## 2018-02-02 DIAGNOSIS — I25118 Atherosclerotic heart disease of native coronary artery with other forms of angina pectoris: Secondary | ICD-10-CM | POA: Diagnosis not present

## 2018-02-02 DIAGNOSIS — I739 Peripheral vascular disease, unspecified: Secondary | ICD-10-CM | POA: Diagnosis not present

## 2018-02-02 MED ORDER — METFORMIN HCL 1000 MG PO TABS: 1000.0000 mg | ORAL_TABLET | Freq: Two times a day (BID) | ORAL | 3 refills | Status: DC

## 2018-02-02 NOTE — Progress Notes (Signed)
Daily Session Note  Patient Details  Name: Candace [name] MRN: [id number] Date of Birth: 27-Aug-1950 Referring Provider:     Cardiac Rehab from 11/24/2017 in [facility] Cardiac and Pulmonary Rehab  Referring Provider  [name]      Encounter Date: 02/02/2018  Check In: Session Check In - 02/02/18 0731      Check-In   Supervising physician immediately available to respond to emergencies  See telemetry face sheet for immediately available ER MD    Location  [facility]-Cardiac & Pulmonary Rehab    Staff Present  Gerlene Burdock, RN, BSN;Other;Mickey Hebel Luan Pulling, MA, RCEP, CCRP, Exercise Physiologist   Pryor Montes Durrell   Medication changes reported      No    Fall or balance concerns reported     No    Warm-up and Cool-down  Performed on first and last piece of equipment    Resistance Training Performed  Yes    VAD Patient?  No    PAD/SET Patient?  No      Pain Assessment   Currently in Pain?  No/denies          Social History   Tobacco Use  Smoking Status Former Smoker  . Packs/day: 0.50  . Types: Cigarettes  . Last attempt to quit: 10/07/2001  . Years since quitting: 16.3  Smokeless Tobacco Never Used    Goals Met:  Independence with exercise equipment Exercise tolerated well No report of cardiac concerns or symptoms Strength training completed today  Goals Unmet:  Not Applicable  Comments: Pt able to follow exercise prescription today without complaint.  Will continue to monitor for progression. Candace [name] went to doctor yesterday after class to get her arm evaluated after her fall.  She had x-rays done and has a hairline fracture in her elbow.  She has broken this arm several time before.  She is to avoid using her arm for the most part so we will not use that arm for a bit.   Dr. Emily Filbert is Medical Director for [facility].

## 2018-02-02 NOTE — Progress Notes (Signed)
Patient: Candace [name] Female    DOB: 07/11/1950   67 y.o.   MRN: [id number] Visit Date: 02/02/2018  Today's Provider: [name], MD   Chief Complaint  Patient presents with  . Diabetes  . Hypertension  . Fall   Subjective:    HPI  Patient comes in today for a follow up. She was last seen in the office 2 months ago. On last visit, patient was advised that she can start driving short distances during the day. BP Readings from Last 3 Encounters:  02/02/18 132/78  12/08/17 132/80  12/08/17 134/74     Patient has also had a fall 2 days ago and she was seen at the urgent care. She was prescribed Norco as needed, and reports that she has a hairline fracture in her right elbow. She reports that she only takes pain medication at night.  Patient also reports that she has not been on Metformin for at least 2 months due to it not being refilled. Patient just recently had labs done on 10/17.  Much better,much stronger.No falls. Lab Results  Component Value Date   HGBA1C 8.2 (H) 01/12/2018      Allergies  Allergen Reactions  . Lisinopril     cough  . Penicillins Swelling, Rash and Other (See Comments)    Has patient had a PCN reaction causing immediate rash, facial/tongue/throat swelling, SOB or lightheadedness with hypotension: Yes Has patient had a PCN reaction causing severe rash involving mucus membranes or skin necrosis: Yes Has patient had a PCN reaction that required hospitalization No Has patient had a PCN reaction occurring within the last 10 years: Yes If all of the above answers are "NO", then may proceed with Cephalosporin use.      Current Outpatient Medications:  .  acetaminophen (TYLENOL) 500 MG tablet, Take 2 tablets (1,000 mg total) by mouth every 8 (eight) hours as needed., Disp: 60 tablet, Rfl: 1 .  clopidogrel (PLAVIX) 75 MG tablet, Take 75 mg by mouth daily., Disp: , Rfl:  .  Coenzyme Q10 (CO Q 10) 100 MG CAPS, Take 100 mg by mouth  daily., Disp: 30 capsule, Rfl: 12 .  [employer] ASPIRIN ADULT LOW DOSE 81 MG chewable tablet, CHEW 1 TABLET (81 MG TOTAL) BY MOUTH DAILY., Disp: 90 tablet, Rfl: 3 .  cyproheptadine (PERIACTIN) 4 MG tablet, Take 4 mg by mouth 3 (three) times daily as needed for allergies., Disp: , Rfl:  .  furosemide (LASIX) 20 MG tablet, Take 20 mg by mouth daily., Disp: , Rfl:  .  isosorbide mononitrate (IMDUR) 30 MG 24 hr tablet, Take 2 tablets (60 mg total) by mouth daily., Disp: 60 tablet, Rfl: 12 .  isosorbide mononitrate (IMDUR) 30 MG 24 hr tablet, TAKE 1 TABLET (30 MG TOTAL) BY MOUTH 2 (TWO) TIMES DAILY., Disp: 180 tablet, Rfl: 4 .  loperamide (IMODIUM) 2 MG capsule, Take by mouth as needed for diarrhea or loose stools., Disp: , Rfl:  .  metFORMIN (GLUCOPHAGE) 1000 MG tablet, Take 1 tablet (1,000 mg total) by mouth 2 (two) times daily with a meal., Disp: 180 tablet, Rfl: 3 .  metoprolol tartrate (LOPRESSOR) 50 MG tablet, TAKE 1 TABLET BY MOUTH TWICE A DAY FOR HIGH BLOOD PRESSURE, Disp: 60 tablet, Rfl: 12 .  nitroGLYCERIN (NITROSTAT) 0.4 MG SL tablet, Place 1 tablet (0.4 mg total) under the tongue every 5 (five) minutes as needed for chest pain., Disp: 30 tablet, Rfl: 0 .  omeprazole (  PRILOSEC) 20 MG capsule, TAKE 1 CAPSULE BY MOUTH EVERY DAY, Disp: 90 capsule, Rfl: 1 .  ondansetron (ZOFRAN-ODT) 8 MG disintegrating tablet, DISSOLVE 1 TABLET ON THE TONGUE EVERY 8 HOURS AS NEEDED FOR NAUSEA OR VOMITING, Disp: 30 tablet, Rfl: 1 .  rosuvastatin (CRESTOR) 10 MG tablet, Take 1 tablet (10 mg total) by mouth daily., Disp: 90 tablet, Rfl: 3 .  sucralfate (CARAFATE) 1 g tablet, Take 1 tablet (1 g total) by mouth 2 (two) times daily., Disp: 60 tablet, Rfl: 5 .  tiZANidine (ZANAFLEX) 2 MG tablet, Take 2 mg by mouth at bedtime. , Disp: , Rfl:   Review of Systems  Constitutional: Negative.   Eyes: Negative.   Respiratory: Negative.   Cardiovascular: Negative.   Genitourinary: Negative.   Musculoskeletal: Positive for  arthralgias, back pain and myalgias.  Skin: Negative.   Allergic/Immunologic: Negative.   Neurological: Positive for light-headedness.  Hematological: Negative.   Psychiatric/Behavioral: Negative.     Social History   Tobacco Use  . Smoking status: Former Smoker    Packs/day: 0.50    Types: Cigarettes    Last attempt to quit: 10/07/2001    Years since quitting: 16.3  . Smokeless tobacco: Never Used  Substance Use Topics  . Alcohol use: No    Alcohol/week: 0.0 standard drinks   Objective:   BP 132/78 (BP Location: Left Arm, Patient Position: Sitting, Cuff Size: Normal)   Pulse 64   Temp 98 F (36.7 C)   Resp 16   Ht 5\' 2"  (1.575 m)   Wt 175 lb (79.4 kg)   SpO2 96%   BMI 32.01 kg/m  Vitals:   02/02/18 1015  BP: 132/78  Pulse: 64  Resp: 16  Temp: 98 F (36.7 C)  SpO2: 96%  Weight: 175 lb (79.4 kg)  Height: 5\' 2"  (1.575 m)     Physical Exam  Constitutional: She is oriented to person, place, and time. She appears well-developed and well-nourished.  HENT:  Head: Normocephalic and atraumatic.  Right Ear: External ear normal.  Left Ear: External ear normal.  Nose: Nose normal.  Eyes: Conjunctivae are normal. No scleral icterus.  Neck: No thyromegaly present.  Cardiovascular: Normal rate, regular rhythm, normal heart sounds and intact distal pulses.  Pulmonary/Chest: Effort normal.  Abdominal: Soft.  Musculoskeletal: She exhibits no edema.  Neurological: She is alert and oriented to person, place, and time.  Skin: Skin is warm and dry.  Psychiatric: She has a normal mood and affect. Her behavior is normal. Judgment and thought content normal.        Assessment & Plan:     1. PAD (peripheral artery disease) (HCC) Clinically pt with claudication - Ambulatory referral to Vascular Surgery  2. Claudication of calf muscles ([place])  - Ambulatory referral to Vascular Surgery  3. Type 2 diabetes mellitus with complication, without long-term current use of insulin  (HCC) RF Metformin. - metFORMIN (GLUCOPHAGE) 1000 MG tablet; Take 1 tablet (1,000 mg total) by mouth 2 (two) times daily with a meal.  Dispense: 180 tablet; Refill: 3  4. Benign essential HTN   5. Coronary artery disease of bypass graft of native heart with stable angina pectoris ([place])   6. Chronic fatigue Pt given ok to drive again.  7. Depression with anxiety Improving.RTC 3 months.      I have done the exam and reviewed the above chart and it is accurate to the best of my knowledge. Development worker, community has been used in this note  in any air is in the dictation or transcription are unintentional.  [name], MD  [facility]

## 2018-02-07 ENCOUNTER — Encounter: Payer: Medicare Other | Admitting: *Deleted

## 2018-02-07 DIAGNOSIS — I25118 Atherosclerotic heart disease of native coronary artery with other forms of angina pectoris: Secondary | ICD-10-CM | POA: Diagnosis not present

## 2018-02-07 DIAGNOSIS — I208 Other forms of angina pectoris: Secondary | ICD-10-CM

## 2018-02-07 NOTE — Progress Notes (Signed)
Daily Session Note  Patient Details  Name: Candace [name] MRN: [id number] Date of Birth: October 16, 1950 Referring Provider:     Cardiac Rehab from 11/24/2017 in [facility] Cardiac and Pulmonary Rehab  Referring Provider  Candace Royals MD      Encounter Date: 02/07/2018  Check In: Session Check In - 02/07/18 0727      Check-In   Supervising physician immediately available to respond to emergencies  See telemetry face sheet for immediately available ER MD    Location  [facility]-Cardiac & Pulmonary Rehab    Staff Present  Candace Burdock, RN, BSN;Other;Candace Cappella Luan Pulling, MA, RCEP, CCRP, Exercise Physiologist   Candace [name]   Medication changes reported      No    Fall or balance concerns reported     No    Warm-up and Cool-down  Performed on first and last piece of equipment    Resistance Training Performed  Yes    VAD Patient?  No    PAD/SET Patient?  No      Pain Assessment   Currently in Pain?  No/denies          Social History   Tobacco Use  Smoking Status Former Smoker  . Packs/day: 0.50  . Types: Cigarettes  . Last attempt to quit: 10/07/2001  . Years since quitting: 16.3  Smokeless Tobacco Never Used    Goals Met:  Independence with exercise equipment Exercise tolerated well No report of cardiac concerns or symptoms Strength training completed today  Goals Unmet:  Not Applicable  Comments: Pt able to follow exercise prescription today without complaint.  Will continue to monitor for progression.    Dr. Emily [name] is Medical Director for [name] and [facility].

## 2018-02-07 NOTE — Patient Instructions (Signed)
Discharge Patient Instructions  Patient Details  Name: Candace [name] MRN: [id number] Date of Birth: 1950/07/07 Referring Provider:  [name].,*   Number of Visits: 36  Reason for Discharge:  Patient reached a stable level of exercise. Patient independent in their exercise. Patient has met program and personal goals.  Smoking History:  Social History   Tobacco Use  Smoking Status Former Smoker  . Packs/day: 0.50  . Types: Cigarettes  . Last attempt to quit: 10/07/2001  . Years since quitting: 16.3  Smokeless Tobacco Never Used    Diagnosis:  Chronic stable angina [facility])  Initial Exercise Prescription: Initial Exercise Prescription - 11/24/17 1500      Date of Initial Exercise RX and Referring Provider   Date  11/24/17    Referring Provider  [name]      Treadmill   MPH  2.1    Grade  1    Minutes  15    METs  2.91      NuStep   Level  2    SPM  80    Minutes  15    METs  2.9      REL-XR   Level  2    Speed  50    Minutes  15    METs  2.9      Prescription Details   Frequency (times per week)  2    Duration  Progress to 30 minutes of continuous aerobic without signs/symptoms of physical distress      Intensity   THRR 40-80% of Max Heartrate  121-143    Ratings of Perceived Exertion  11-13    Perceived Dyspnea  0-4      Progression   Progression  Continue to progress workloads to maintain intensity without signs/symptoms of physical distress.      Resistance Training   Training Prescription  Yes    Weight  3 lbs    Reps  10-15       Discharge Exercise Prescription (Final Exercise Prescription Changes): Exercise Prescription Changes - 01/23/18 1600      Response to Exercise   Blood Pressure (Admit)  126/72    Blood Pressure (Exercise)  156/86    Blood Pressure (Exit)  132/62    Heart Rate (Admit)  84 bpm    Heart Rate (Exercise)  125 bpm    Heart Rate (Exit)  82 bpm    Rating of Perceived Exertion (Exercise)  12     Symptoms  none    Duration  Continue with 30 min of aerobic exercise without signs/symptoms of physical distress.    Intensity  THRR unchanged      Progression   Progression  Continue to progress workloads to maintain intensity without signs/symptoms of physical distress.    Average METs  4.08      Resistance Training   Training Prescription  Yes    Weight  3 lbs    Reps  10-15      Interval Training   Interval Training  No      NuStep   Level  5    Minutes  15    METs  5.2      REL-XR   Level  4    Minutes  15    METs  3.6      Track   Laps  53    Minutes  15    METs  3.45      Home  Exercise Plan   Plans to continue exercise at  Home (comment)   walking   Frequency  Add 3 additional days to program exercise sessions.    Initial Home Exercises Provided  12/13/17       Functional Capacity: 6 Minute Walk    Row Name 11/24/17 1524 01/31/18 0818       6 Minute Walk   Phase  Initial  Discharge    Distance  1142 feet  1285 feet    Distance % Change  -  12.5 %    Distance Feet Change  -  143 ft    Walk Time  6 minutes  6 minutes    # of Rest Breaks  0  0    MPH  2.16  2.43    METS  3.5  3.07    RPE  9  14    Perceived Dyspnea   0  1    VO2 Peak  12.25  10.75    Symptoms  No  No    Resting HR  99 bpm  67 bpm    Resting BP  122/74  110/82    Resting Oxygen Saturation   95 %  98 %    Exercise Oxygen Saturation  during 6 min walk  98 %  99 %    Max Ex. HR  147 bpm  108 bpm    Max Ex. BP  174/88  158/74    2 Minute Post BP  134/84  134/70       Quality of Life: Quality of Life - 01/26/18 1012      Quality of Life   Select  Quality of Life      Quality of Life Scores   Health/Function Pre  17.43 %    Health/Function Post  19.92 %    Health/Function % Change  14.29 %    Socioeconomic Pre  15.71 %    Socioeconomic Post  17.29 %    Socioeconomic % Change   10.06 %    Psych/Spiritual Pre  23 %    Psych/Spiritual Post  24.5 %    Psych/Spiritual %  Change  6.52 %    Family Pre  18.38 %    Family Post  25.13 %    Family % Change  36.72 %    GLOBAL Pre  18.39 %    GLOBAL Post  21.07 %    GLOBAL % Change  14.57 %       Personal Goals: Goals established at orientation with interventions provided to work toward goal. Personal Goals and Risk Factors at Admission - 11/24/17 1539      Core Components/Risk Factors/Patient Goals on Admission    Weight Management  Yes;Obesity;Weight Loss    Intervention  Weight Management: Develop a combined nutrition and exercise program designed to reach desired caloric intake, while maintaining appropriate intake of nutrient and fiber, sodium and fats, and appropriate energy expenditure required for the weight goal.;Weight Management: Provide education and appropriate resources to help participant work on and attain dietary goals.;Weight Management/Obesity: Establish reasonable short term and long term weight goals.;Obesity: Provide education and appropriate resources to help participant work on and attain dietary goals.    Admit Weight  169 lb 4.8 oz (76.8 kg)    Goal Weight: Short Term  159 lb (72.1 kg)    Goal Weight: Long Term  145 lb (65.8 kg)    Expected Outcomes  Short Term: Continue to assess and  modify interventions until short term weight is achieved;Long Term: Adherence to nutrition and physical activity/exercise program aimed toward attainment of established weight goal;Weight Loss: Understanding of general recommendations for a balanced deficit meal plan, which promotes 1-2 lb weight loss per week and includes a negative energy balance of [phone] kcal/d;Understanding recommendations for meals to include 15-35% energy as protein, 25-35% energy from fat, 35-60% energy from carbohydrates, less than 27m of dietary cholesterol, 20-35 gm of total fiber daily;Understanding of distribution of calorie intake throughout the day with the consumption of 4-5 meals/snacks    Diabetes  Yes    Intervention   Provide education about signs/symptoms and action to take for hypo/hyperglycemia.;Provide education about proper nutrition, including hydration, and aerobic/resistive exercise prescription along with prescribed medications to achieve blood glucose in normal ranges: Fasting glucose 65-99 mg/dL    Expected Outcomes  Short Term: Participant verbalizes understanding of the signs/symptoms and immediate care of hyper/hypoglycemia, proper foot care and importance of medication, aerobic/resistive exercise and nutrition plan for blood glucose control.;Long Term: Attainment of HbA1C < 7%.    Heart Failure  Yes    Intervention  Provide a combined exercise and nutrition program that is supplemented with education, support and counseling about heart failure. Directed toward relieving symptoms such as shortness of breath, decreased exercise tolerance, and extremity edema.    Expected Outcomes  Improve functional capacity of life;Short term: Attendance in program 2-3 days a week with increased exercise capacity. Reported lower sodium intake. Reported increased fruit and vegetable intake. Reports medication compliance.;Short term: Daily weights obtained and reported for increase. Utilizing diuretic protocols set by physician.;Long term: Adoption of self-care skills and reduction of barriers for early signs and symptoms recognition and intervention leading to self-care maintenance.    Hypertension  Yes    Intervention  Provide education on lifestyle modifcations including regular physical activity/exercise, weight management, moderate sodium restriction and increased consumption of fresh fruit, vegetables, and low fat dairy, alcohol moderation, and smoking cessation.;Monitor prescription use compliance.    Expected Outcomes  Short Term: Continued assessment and intervention until BP is < 140/923mHG in hypertensive participants. < 130/808mG in hypertensive participants with diabetes, heart failure or chronic kidney  disease.;Long Term: Maintenance of blood pressure at goal levels.    Lipids  Yes    Intervention  Provide education and support for participant on nutrition & aerobic/resistive exercise along with prescribed medications to achieve LDL <63m57mDL >40mg16m Expected Outcomes  Short Term: Participant states understanding of desired cholesterol values and is compliant with medications prescribed. Participant is following exercise prescription and nutrition guidelines.;Long Term: Cholesterol controlled with medications as prescribed, with individualized exercise RX and with personalized nutrition plan. Value goals: LDL < 63mg,46m > 40 mg.        Personal Goals Discharge: Goals and Risk Factor Review - 01/12/18 0831      Core Components/Risk Factors/Patient Goals Review   Personal Goals Review  Weight Management/Obesity;Hypertension;Diabetes;Lipids;Heart Failure    Review  Candace [name] to do well in rehab.  She has been maintaining her weight and denies heart failure symptoms.  She is doing well with her blood pressures and blood sugars in generally.  Some days she runs a little high, but overall she is doing well.  She still wants to come off of her meds, but she knows that they are helping.  She has been recording her numbers a little more frequently.     Expected Outcomes  Short: Continue to check  more frequently and record it.  Long: Continue to take medications and work on heart failure.        Exercise Goals and Review: Exercise Goals    Row Name 11/24/17 1530             Exercise Goals   Increase Physical Activity  Yes       Intervention  Provide advice, education, support and counseling about physical activity/exercise needs.;Develop an individualized exercise prescription for aerobic and resistive training based on initial evaluation findings, risk stratification, comorbidities and participant's personal goals.       Expected Outcomes  Short Term: Attend rehab on a regular basis to  increase amount of physical activity.;Long Term: Add in home exercise to make exercise part of routine and to increase amount of physical activity.;Long Term: Exercising regularly at least 3-5 days a week.       Increase Strength and Stamina  Yes       Intervention  Provide advice, education, support and counseling about physical activity/exercise needs.;Develop an individualized exercise prescription for aerobic and resistive training based on initial evaluation findings, risk stratification, comorbidities and participant's personal goals.       Expected Outcomes  Short Term: Increase workloads from initial exercise prescription for resistance, speed, and METs.;Short Term: Perform resistance training exercises routinely during rehab and add in resistance training at home;Long Term: Improve cardiorespiratory fitness, muscular endurance and strength as measured by increased METs and functional capacity (6MWT)       Able to understand and use rate of perceived exertion (RPE) scale  Yes       Intervention  Provide education and explanation on how to use RPE scale       Expected Outcomes  Short Term: Able to use RPE daily in rehab to express subjective intensity level;Long Term:  Able to use RPE to guide intensity level when exercising independently       Able to understand and use Dyspnea scale  Yes       Intervention  Provide education and explanation on how to use Dyspnea scale       Expected Outcomes  Short Term: Able to use Dyspnea scale daily in rehab to express subjective sense of shortness of breath during exertion;Long Term: Able to use Dyspnea scale to guide intensity level when exercising independently       Knowledge and understanding of Target Heart Rate Range (THRR)  Yes       Intervention  Provide education and explanation of THRR including how the numbers were predicted and where they are located for reference       Expected Outcomes  Short Term: Able to use daily as guideline for intensity  in rehab;Short Term: Able to state/look up THRR;Long Term: Able to use THRR to govern intensity when exercising independently       Able to check pulse independently  Yes       Intervention  Provide education and demonstration on how to check pulse in carotid and radial arteries.;Review the importance of being able to check your own pulse for safety during independent exercise       Expected Outcomes  Short Term: Able to explain why pulse checking is important during independent exercise;Long Term: Able to check pulse independently and accurately       Understanding of Exercise Prescription  Yes       Intervention  Provide education, explanation, and written materials on patient's individual exercise prescription  Expected Outcomes  Short Term: Able to explain program exercise prescription;Long Term: Able to explain home exercise prescription to exercise independently          Exercise Goals Re-Evaluation: Exercise Goals Re-Evaluation    Row Name 12/06/17 0834 12/13/17 0819 12/20/17 0824 12/26/17 1555 01/09/18 1605     Exercise Goal Re-Evaluation   Exercise Goals Review  Increase Physical Activity;Increase Strength and Stamina;Able to understand and use rate of perceived exertion (RPE) scale;Knowledge and understanding of Target Heart Rate Range (THRR);Understanding of Exercise Prescription  Increase Physical Activity;Increase Strength and Stamina;Able to understand and use rate of perceived exertion (RPE) scale;Knowledge and understanding of Target Heart Rate Range (THRR);Understanding of Exercise Prescription;Able to check pulse independently  Increase Physical Activity;Increase Strength and Stamina;Understanding of Exercise Prescription  Increase Physical Activity;Increase Strength and Stamina;Understanding of Exercise Prescription  Increase Physical Activity;Increase Strength and Stamina;Understanding of Exercise Prescription   Comments  Reviewed RPE scale, THR and program prescription with  pt today.  Pt voiced understanding and was given a copy of goals to take home.   Reviewed home exercise with pt today.  Pt plans to walk at home for exercise.  Reviewed THR, pulse, RPE, sign and symptoms, NTG use, and when to call 911 or MD.  Also discussed weather considerations and indoor options.  Pt voiced understanding.  Candace [name] has been doing well in rehab. She is walking 3 days a week at home for 45mn.  This past week she went Friday, Saturday, and Sunday.  She feels like her strength and stamina are starting to come back too!!    GKassadeecontinues to do well in rehab. She has moved from the treadmill back to the track as she really enjoys dancing around!  We will continue to monitor her progress.   GJamilethas started to attend rehab three days a week (Tues/Wed/Thurs).  She is up to 68 laps on the track.  We will continue to monitor her progression.    Expected Outcomes  Short: Use RPE daily to regulate intensity. Long: Follow program prescription in THR.  Short: Start walking at home on own.  Long: Continue to build strength and stamina.   Short: Continue to increase walking at home.  Long: Continue to work on bAnimator   Short: Increase level on XR.  Long: Continue to walk more at home.   Short: Continue to increase workloads.  Long: Continue to increase activity levels.    RPerrytonName 01/12/18 0820 01/23/18 1628           Exercise Goal Re-Evaluation   Exercise Goals Review  Increase Physical Activity;Increase Strength and Stamina;Understanding of Exercise Prescription  Increase Physical Activity;Increase Strength and Stamina;Understanding of Exercise Prescription      Comments  Candace [name] doing well in rehab.  She is busy dancing around the track.  She is starting to get her strength and stamina back.  She is walking at home everyday!!  GVernettecontinues to do well in rehab.  She tried to get back up on the treadmill but was walking slower than she dances around the track so she went back to the  track.  She is also up to 5.2 METs on the NuStep.  We will continue to monitor her progress.       Expected Outcomes  Short: Continue to add in walking at home  Long: Continue to increase strength and stamina.   Short: Increase workload on XR.  Long: Continue to improve strength and stamina  as she wants to work and drive again.          Nutrition & Weight - Outcomes: Pre Biometrics - 11/24/17 1532      Pre Biometrics   Height  '5\' 2"'  (1.575 m)    Weight  169 lb 4.8 oz (76.8 kg)    Waist Circumference  36.5 inches    Hip Circumference  40.25 inches    Waist to Hip Ratio  0.91 %    BMI (Calculated)  30.96    Single Leg Stand  5.78 seconds      Post Biometrics - 01/31/18 0819       Post  Biometrics   Height  '5\' 2"'  (1.575 m)    Weight  174 lb (78.9 kg)    Waist Circumference  37 inches    Hip Circumference  42 inches    Waist to Hip Ratio  0.88 %    BMI (Calculated)  31.82    Single Leg Stand  8.9 seconds       Nutrition: Nutrition Therapy & Goals - 12/06/17 0847      Nutrition Therapy   Diet  DM/ TLC    Protein (specify units)  11oz    Fiber  25 grams    Whole Grain Foods  3 servings    Saturated Fats  14 max. grams    Fruits and Vegetables  5 servings/day   8 ideal, working to increase fruit and vegetable intake   Sodium  1500 grams      Personal Nutrition Goals   Nutrition Goal  Because you typically eat 1-2 meals per day, it may be a good idea to consume Glucerna nutritional drinks on a more consistent basis as a meal replacement or snack between meals.    Personal Goal #2  Continue to monitor sodium intake and choose foods with less than 198m of sodium per serving    Personal Goal #3  Prioritize protein at meal times. Previously, you mentioned that dairy may be most feasible for you to eat reagularly. For example, drink a glass of milk or have a yogurt or cheese and crackers as a snack    Comments  She continues to c/o decreased appetite but feels that she has been  doing well overall with diet. Her BG readings typically stay less than 200 and above 100. She is consuming some dairy and drinks Glucerna shakes "occasionally"      Intervention Plan   Intervention  Prescribe, educate and counsel regarding individualized specific dietary modifications aiming towards targeted core components such as weight, hypertension, lipid management, diabetes, heart failure and other comorbidities.    Expected Outcomes  Short Term Goal: Understand basic principles of dietary content, such as calories, fat, sodium, cholesterol and nutrients.;Short Term Goal: A plan has been developed with personal nutrition goals set during dietitian appointment.;Long Term Goal: Adherence to prescribed nutrition plan.       Nutrition Discharge: Nutrition Assessments - 01/26/18 1013      MEDFICTS Scores   Pre Score  6    Post Score  27    Score Difference  21       Education Questionnaire Score: Knowledge Questionnaire Score - 01/26/18 1012      Knowledge Questionnaire Score   Pre Score  24/26    Post Score  26/26       Goals reviewed with patient; copy given to patient.

## 2018-02-08 DIAGNOSIS — I25118 Atherosclerotic heart disease of native coronary artery with other forms of angina pectoris: Secondary | ICD-10-CM | POA: Diagnosis not present

## 2018-02-08 DIAGNOSIS — I208 Other forms of angina pectoris: Secondary | ICD-10-CM

## 2018-02-08 NOTE — Progress Notes (Signed)
Cardiac Individual Treatment Plan  Patient Details  Name: Candace [name] MRN: [id number] Date of Birth: 09/21/1950 Referring Provider:     Cardiac Rehab from 11/24/2017 in [facility] Cardiac and Pulmonary Rehab  Referring Provider  [name]      Initial Encounter Date:    Cardiac Rehab from 11/24/2017 in [facility] Cardiac and Pulmonary Rehab  Date  11/24/17      Visit Diagnosis: Chronic stable angina ([place])  Patient's Home Medications on Admission:  Current Outpatient Medications:  .  acetaminophen (TYLENOL) 500 MG tablet, Take 2 tablets (1,000 mg total) by mouth every 8 (eight) hours as needed., Disp: 60 tablet, Rfl: 1 .  clopidogrel (PLAVIX) 75 MG tablet, Take 75 mg by mouth daily., Disp: , Rfl:  .  Coenzyme Q10 (CO Q 10) 100 MG CAPS, Take 100 mg by mouth daily., Disp: 30 capsule, Rfl: 12 .  [employer] ASPIRIN ADULT LOW DOSE 81 MG chewable tablet, CHEW 1 TABLET (81 MG TOTAL) BY MOUTH DAILY., Disp: 90 tablet, Rfl: 3 .  cyproheptadine (PERIACTIN) 4 MG tablet, Take 4 mg by mouth 3 (three) times daily as needed for allergies., Disp: , Rfl:  .  furosemide (LASIX) 20 MG tablet, Take 20 mg by mouth daily., Disp: , Rfl:  .  isosorbide mononitrate (IMDUR) 30 MG 24 hr tablet, Take 2 tablets (60 mg total) by mouth daily., Disp: 60 tablet, Rfl: 12 .  isosorbide mononitrate (IMDUR) 30 MG 24 hr tablet, TAKE 1 TABLET (30 MG TOTAL) BY MOUTH 2 (TWO) TIMES DAILY., Disp: 180 tablet, Rfl: 4 .  loperamide (IMODIUM) 2 MG capsule, Take by mouth as needed for diarrhea or loose stools., Disp: , Rfl:  .  metFORMIN (GLUCOPHAGE) 1000 MG tablet, Take 1 tablet (1,000 mg total) by mouth 2 (two) times daily with a meal., Disp: 180 tablet, Rfl: 3 .  metoprolol tartrate (LOPRESSOR) 50 MG tablet, TAKE 1 TABLET BY MOUTH TWICE A DAY FOR HIGH BLOOD PRESSURE, Disp: 60 tablet, Rfl: 12 .  nitroGLYCERIN (NITROSTAT) 0.4 MG SL tablet, Place 1 tablet (0.4 mg total) under the tongue every 5 (five) minutes as needed for chest pain., Disp:  30 tablet, Rfl: 0 .  omeprazole (PRILOSEC) 20 MG capsule, TAKE 1 CAPSULE BY MOUTH EVERY DAY, Disp: 90 capsule, Rfl: 1 .  ondansetron (ZOFRAN-ODT) 8 MG disintegrating tablet, DISSOLVE 1 TABLET ON THE TONGUE EVERY 8 HOURS AS NEEDED FOR NAUSEA OR VOMITING, Disp: 30 tablet, Rfl: 1 .  rosuvastatin (CRESTOR) 10 MG tablet, Take 1 tablet (10 mg total) by mouth daily., Disp: 90 tablet, Rfl: 3 .  sucralfate (CARAFATE) 1 g tablet, Take 1 tablet (1 g total) by mouth 2 (two) times daily., Disp: 60 tablet, Rfl: 5 .  tiZANidine (ZANAFLEX) 2 MG tablet, Take 2 mg by mouth at bedtime. , Disp: , Rfl:   Past Medical History: Past Medical History:  Diagnosis Date  . Allergy   . Anemia   . Anxiety   . Arrhythmia   . Arthritis   . Coronary artery disease   . Depression   . Diabetes mellitus without complication ([place])   . Dyspnea    doe  . Dysrhythmia   . GERD (gastroesophageal reflux disease)   . Headache   . History of hiatal hernia   . Hyperlipidemia   . Hypertension   . Myocardial infarction ([place])    2016, 04/2017  . Myocardial infarction with cardiac rehabilitation [facility])    MI 2016/ CABG 8/17    FINISHED CARDIAC  REHAB 3 WEEKS AGO  . Panic attack   . Pneumonia   . Reflux   . Stroke [facility]) 2015   showed up on MRI; no weakness noted  . TIA (transient ischemic attack)   . Voice tremor     Tobacco Use: Social History   Tobacco Use  Smoking Status Former Smoker  . Packs/day: 0.50  . Types: Cigarettes  . Last attempt to quit: 10/07/2001  . Years since quitting: 16.3  Smokeless Tobacco Never Used    Labs: Recent Review Flowsheet Data    Labs for ITP Cardiac and Pulmonary Rehab Latest Ref Rng & Units 10/01/2016 12/23/2016 03/24/2017 08/16/2017 01/12/2018   Cholestrol 100 - 199 mg/dL 259(H) - - - 167   LDLCALC 0 - 99 mg/dL 173(H) - - - 87   HDL >39 mg/dL 66 - - - 51   Trlycerides 0 - 149 mg/dL 100 - - - 145   Hemoglobin A1c 4.8 - 5.6 % 6.9(H) 7.3(H) 7.8(H) 8.0(H) 8.2(H)   PHART 7.350 - 7.450 -  - - - -   PCO2ART 35.0 - 45.0 mmHg - - - - -   HCO3 20.0 - 24.0 mEq/L - - - - -   TCO2 0 - 100 mmol/L - - - - -   O2SAT % - - - - -       Exercise Target Goals: Exercise Program Goal: Individual exercise prescription set using results from initial 6 min walk test and THRR while considering  patient's activity barriers and safety.   Exercise Prescription Goal: Initial exercise prescription builds to 30-45 minutes a day of aerobic activity, 2-3 days per week.  Home exercise guidelines will be given to patient during program as part of exercise prescription that the participant will acknowledge.  Activity Barriers & Risk Stratification: Activity Barriers & Cardiac Risk Stratification - 11/24/17 1525      Activity Barriers & Cardiac Risk Stratification   Activity Barriers  Back Problems;Deconditioning;Shortness of Breath;Chest Pain/Angina;Decreased Ventricular Function;Muscular Weakness;Other (comment);Balance Concerns    Comments  Recent TIA, still having some headaches caused by compression in cervial spine, chest pain is improving (but has not been taking her Imdur)    Cardiac Risk Stratification  High       6 Minute Walk: 6 Minute Walk    Row Name 11/24/17 1524 01/31/18 0818       6 Minute Walk   Phase  Initial  Discharge    Distance  1142 feet  1285 feet    Distance % Change  -  12.5 %    Distance Feet Change  -  143 ft    Walk Time  6 minutes  6 minutes    # of Rest Breaks  0  0    MPH  2.16  2.43    METS  3.5  3.07    RPE  9  14    Perceived Dyspnea   0  1    VO2 Peak  12.25  10.75    Symptoms  No  No    Resting HR  99 bpm  67 bpm    Resting BP  122/74  110/82    Resting Oxygen Saturation   95 %  98 %    Exercise Oxygen Saturation  during 6 min walk  98 %  99 %    Max Ex. HR  147 bpm  108 bpm    Max Ex. BP  174/88  158/74    2  Minute Post BP  134/84  134/70       Oxygen Initial Assessment:   Oxygen Re-Evaluation:   Oxygen Discharge (Final Oxygen  Re-Evaluation):   Initial Exercise Prescription: Initial Exercise Prescription - 11/24/17 1500      Date of Initial Exercise RX and Referring Provider   Date  11/24/17    Referring Provider  [name]      Treadmill   MPH  2.1    Grade  1    Minutes  15    METs  2.91      NuStep   Level  2    SPM  80    Minutes  15    METs  2.9      REL-XR   Level  2    Speed  50    Minutes  15    METs  2.9      Prescription Details   Frequency (times per week)  2    Duration  Progress to 30 minutes of continuous aerobic without signs/symptoms of physical distress      Intensity   THRR 40-80% of Max Heartrate  121-143    Ratings of Perceived Exertion  11-13    Perceived Dyspnea  0-4      Progression   Progression  Continue to progress workloads to maintain intensity without signs/symptoms of physical distress.      Resistance Training   Training Prescription  Yes    Weight  3 lbs    Reps  10-15       Perform Capillary Blood Glucose checks as needed.  Exercise Prescription Changes: Exercise Prescription Changes    Row Name 11/24/17 1500 12/12/17 1600 12/13/17 0800 12/26/17 1500 01/09/18 1600     Response to Exercise   Blood Pressure (Admit)  122/74  116/74  -  128/70  124/64   Blood Pressure (Exercise)  174/88  136/64  -  124/64  126/74   Blood Pressure (Exit)  134/84  136/70  -  118/62  128/78   Heart Rate (Admit)  99 bpm  81 bpm  -  88 bpm  73 bpm   Heart Rate (Exercise)  147 bpm  124 bpm  -  113 bpm  124 bpm   Heart Rate (Exit)  103 bpm  84 bpm  -  90 bpm  79 bpm   Oxygen Saturation (Admit)  95 %  -  -  -  -   Oxygen Saturation (Exercise)  98 %  -  -  -  -   Rating of Perceived Exertion (Exercise)  9  13  -  13  12   Perceived Dyspnea (Exercise)  0  -  -  -  -   Symptoms  none  none  -  none  none   Comments  walk test results  first full day of exercise  -  -  -   Duration  Continue with 30 min of aerobic exercise without signs/symptoms of physical  distress.  Continue with 30 min of aerobic exercise without signs/symptoms of physical distress.  -  Continue with 30 min of aerobic exercise without signs/symptoms of physical distress.  Continue with 30 min of aerobic exercise without signs/symptoms of physical distress.   Intensity  -  THRR unchanged  -  THRR unchanged  THRR unchanged     Progression   Progression  -  Continue to progress workloads to maintain intensity without signs/symptoms of physical  distress.  -  Continue to progress workloads to maintain intensity without signs/symptoms of physical distress.  Continue to progress workloads to maintain intensity without signs/symptoms of physical distress.   Average METs  -  2.95  -  2.76  4.01     Resistance Training   Training Prescription  -  Yes  -  Yes  Yes   Weight  -  3 lbs  -  3 lbs  3 lbs   Reps  -  10-15  -  10-15  10-15     Interval Training   Interval Training  -  No  -  No  No     Treadmill   MPH  -  2.1  -  -  -   Grade  -  1  -  -  -   Minutes  -  15  -  -  -   METs  -  2.91  -  -  -     NuStep   Level  -  2  -  3  3   Minutes  -  15  -  15  15   METs  -  3  -  3  4     REL-XR   Level  -  -  -  2  4   Minutes  -  -  -  15  15   METs  -  -  -  2.3  3.9     Track   Laps  -  -  -  50  63   Minutes  -  -  -  15  15   METs  -  -  -  3  4.14     Home Exercise Plan   Plans to continue exercise at  -  -  Home (comment) walking  Home (comment) walking  Home (comment) walking   Frequency  -  -  Add 3 additional days to program exercise sessions.  Add 3 additional days to program exercise sessions.  Add 3 additional days to program exercise sessions.   Initial Home Exercises Provided  -  -  12/13/17  12/13/17  12/13/17   Row Name 01/23/18 1600             Response to Exercise   Blood Pressure (Admit)  126/72       Blood Pressure (Exercise)  156/86       Blood Pressure (Exit)  132/62       Heart Rate (Admit)  84 bpm       Heart Rate (Exercise)  125 bpm        Heart Rate (Exit)  82 bpm       Rating of Perceived Exertion (Exercise)  12       Symptoms  none       Duration  Continue with 30 min of aerobic exercise without signs/symptoms of physical distress.       Intensity  THRR unchanged         Progression   Progression  Continue to progress workloads to maintain intensity without signs/symptoms of physical distress.       Average METs  4.08         Resistance Training   Training Prescription  Yes       Weight  3 lbs       Reps  10-15         Interval Training  Interval Training  No         NuStep   Level  5       Minutes  15       METs  5.2         REL-XR   Level  4       Minutes  15       METs  3.6         Track   Laps  53       Minutes  15       METs  3.45         Home Exercise Plan   Plans to continue exercise at  Home (comment) walking       Frequency  Add 3 additional days to program exercise sessions.       Initial Home Exercises Provided  12/13/17          Exercise Comments: Exercise Comments    Row Name 12/06/17 0834 01/31/18 0825 02/02/18 0733 02/08/18 0741     Exercise Comments  First full day of exercise!  Patient was oriented to gym and equipment including functions, settings, policies, and procedures.  Patient's individual exercise prescription and treatment plan were reviewed.  All starting workloads were established based on the results of the 6 minute walk test done at initial orientation visit.  The plan for exercise progression was also introduced and progression will be customized based on patient's performance and goals.  Candace [name] [name] after the first minute of the walk test.  She stumbled at the door with the carpet to tile transition. She then tried to catch herself on the door and then to her knees and then she laid down for a minute to catch her breath and assess what happened.  She was helped back to up to lean against wall.  Eventually she was able to get up on her own to the chair.  Blood pressure was  136/78.  She skinned her knees a little and elbow feels sore from hitting door.  She was given some lotion for her knees and ice packs.  She chose to walk again after about 12mn of rest.  She was able to walk without a problem but her knees did hurt at the end (6/10).  She was able to finish class today and used the NuStep with ice on her knees.  She states that she feels fine and is planning to go to her appointment with podiatry after class and will let [place] if anything else comes up.   Candace [name] went to doctor yesterday after class to get her arm evaluated after her fall.  She had x-rays done and has a hairline fracture in her elbow.  She has broken this arm several time before.  She is to avoid using her arm for the most part so we will not use that arm for a bit.  Candace [name] graduated today from  rehab with 36 sessions completed.  Details of the patient's exercise prescription and what She needs to do in order to continue the prescription and progress were discussed with patient.  Patient was given a copy of prescription and goals.  Patient verbalized understanding.  Candace [name] plans to continue to exercise by walking at home.       Exercise Goals and Review: Exercise Goals    Row Name 11/24/17 1530             Exercise Goals   Increase Physical Activity  Yes  Intervention  Provide advice, education, support and counseling about physical activity/exercise needs.;Develop an individualized exercise prescription for aerobic and resistive training based on initial evaluation findings, risk stratification, comorbidities and participant's personal goals.       Expected Outcomes  Short Term: Attend rehab on a regular basis to increase amount of physical activity.;Long Term: Add in home exercise to make exercise part of routine and to increase amount of physical activity.;Long Term: Exercising regularly at least 3-5 days a week.       Increase Strength and Stamina  Yes       Intervention  Provide advice,  education, support and counseling about physical activity/exercise needs.;Develop an individualized exercise prescription for aerobic and resistive training based on initial evaluation findings, risk stratification, comorbidities and participant's personal goals.       Expected Outcomes  Short Term: Increase workloads from initial exercise prescription for resistance, speed, and METs.;Short Term: Perform resistance training exercises routinely during rehab and add in resistance training at home;Long Term: Improve cardiorespiratory fitness, muscular endurance and strength as measured by increased METs and functional capacity (6MWT)       Able to understand and use rate of perceived exertion (RPE) scale  Yes       Intervention  Provide education and explanation on how to use RPE scale       Expected Outcomes  Short Term: Able to use RPE daily in rehab to express subjective intensity level;Long Term:  Able to use RPE to guide intensity level when exercising independently       Able to understand and use Dyspnea scale  Yes       Intervention  Provide education and explanation on how to use Dyspnea scale       Expected Outcomes  Short Term: Able to use Dyspnea scale daily in rehab to express subjective sense of shortness of breath during exertion;Long Term: Able to use Dyspnea scale to guide intensity level when exercising independently       Knowledge and understanding of Target Heart Rate Range (THRR)  Yes       Intervention  Provide education and explanation of THRR including how the numbers were predicted and where they are located for reference       Expected Outcomes  Short Term: Able to use daily as guideline for intensity in rehab;Short Term: Able to state/look up THRR;Long Term: Able to use THRR to govern intensity when exercising independently       Able to check pulse independently  Yes       Intervention  Provide education and demonstration on how to check pulse in carotid and radial  arteries.;Review the importance of being able to check your own pulse for safety during independent exercise       Expected Outcomes  Short Term: Able to explain why pulse checking is important during independent exercise;Long Term: Able to check pulse independently and accurately       Understanding of Exercise Prescription  Yes       Intervention  Provide education, explanation, and written materials on patient's individual exercise prescription       Expected Outcomes  Short Term: Able to explain program exercise prescription;Long Term: Able to explain home exercise prescription to exercise independently          Exercise Goals Re-Evaluation : Exercise Goals Re-Evaluation    Row Name 12/06/17 0834 12/13/17 0819 12/20/17 0824 12/26/17 1555 01/09/18 1605     Exercise Goal Re-Evaluation   Exercise Goals  Review  Increase Physical Activity;Increase Strength and Stamina;Able to understand and use rate of perceived exertion (RPE) scale;Knowledge and understanding of Target Heart Rate Range (THRR);Understanding of Exercise Prescription  Increase Physical Activity;Increase Strength and Stamina;Able to understand and use rate of perceived exertion (RPE) scale;Knowledge and understanding of Target Heart Rate Range (THRR);Understanding of Exercise Prescription;Able to check pulse independently  Increase Physical Activity;Increase Strength and Stamina;Understanding of Exercise Prescription  Increase Physical Activity;Increase Strength and Stamina;Understanding of Exercise Prescription  Increase Physical Activity;Increase Strength and Stamina;Understanding of Exercise Prescription   Comments  Reviewed RPE scale, THR and program prescription with pt today.  Pt voiced understanding and was given a copy of goals to take home.   Reviewed home exercise with pt today.  Pt plans to walk at home for exercise.  Reviewed THR, pulse, RPE, sign and symptoms, NTG use, and when to call 911 or MD.  Also discussed weather  considerations and indoor options.  Pt voiced understanding.  Candace [name] has been doing well in rehab. She is walking 3 days a week at home for 2mn.  This past week she went Friday, Saturday, and Sunday.  She feels like her strength and stamina are starting to come back too!!    Candace [name] to do well in rehab. She has moved from the treadmill back to the track as she really enjoys dancing around!  We will continue to monitor her progress.   Candace [name] started to attend rehab three days a week (Tues/Wed/Thurs).  She is up to 68 laps on the track.  We will continue to monitor her progression.    Expected Outcomes  Short: Use RPE daily to regulate intensity. Long: Follow program prescription in THR.  Short: Start walking at home on own.  Long: Continue to build strength and stamina.   Short: Continue to increase walking at home.  Long: Continue to work on bAnimator   Short: Increase level on XR.  Long: Continue to walk more at home.   Short: Continue to increase workloads.  Long: Continue to increase activity levels.    RBankstonName 01/12/18 0820 01/23/18 1628           Exercise Goal Re-Evaluation   Exercise Goals Review  Increase Physical Activity;Increase Strength and Stamina;Understanding of Exercise Prescription  Increase Physical Activity;Increase Strength and Stamina;Understanding of Exercise Prescription      Comments  Candace [name] doing well in rehab.  She is busy dancing around the track.  She is starting to get her strength and stamina back.  She is walking at home everyday!!  Candace [name] to do well in rehab.  She tried to get back up on the treadmill but was walking slower than she dances around the track so she went back to the track.  She is also up to 5.2 METs on the NuStep.  We will continue to monitor her progress.       Expected Outcomes  Short: Continue to add in walking at home  Long: Continue to increase strength and stamina.   Short: Increase workload on XR.  Long: Continue to  improve strength and stamina as she wants to work and drive again.          Discharge Exercise Prescription (Final Exercise Prescription Changes): Exercise Prescription Changes - 01/23/18 1600      Response to Exercise   Blood Pressure (Admit)  126/72    Blood Pressure (Exercise)  156/86    Blood Pressure (Exit)  132/62  Heart Rate (Admit)  84 bpm    Heart Rate (Exercise)  125 bpm    Heart Rate (Exit)  82 bpm    Rating of Perceived Exertion (Exercise)  12    Symptoms  none    Duration  Continue with 30 min of aerobic exercise without signs/symptoms of physical distress.    Intensity  THRR unchanged      Progression   Progression  Continue to progress workloads to maintain intensity without signs/symptoms of physical distress.    Average METs  4.08      Resistance Training   Training Prescription  Yes    Weight  3 lbs    Reps  10-15      Interval Training   Interval Training  No      NuStep   Level  5    Minutes  15    METs  5.2      REL-XR   Level  4    Minutes  15    METs  3.6      Track   Laps  53    Minutes  15    METs  3.45      Home Exercise Plan   Plans to continue exercise at  Home (comment)   walking   Frequency  Add 3 additional days to program exercise sessions.    Initial Home Exercises Provided  12/13/17       Nutrition:  Target Goals: Understanding of nutrition guidelines, daily intake of sodium <1526m, cholesterol <2069m calories 30% from fat and 7% or less from saturated fats, daily to have 5 or more servings of fruits and vegetables.  Biometrics: Pre Biometrics - 11/24/17 1532      Pre Biometrics   Height  _0  (1.575 m)    Weight  169 lb 4.8 oz (76.8 kg)    Waist Circumference  36.5 inches    Hip Circumference  40.25 inches    Waist to Hip Ratio  0.91 %    BMI (Calculated)  30.96    Single Leg Stand  5.78 seconds      Post Biometrics - 01/31/18 0819       Post  Biometrics   Height  _1  (1.575 m)    Weight  174 lb (78.9  kg)    Waist Circumference  37 inches    Hip Circumference  42 inches    Waist to Hip Ratio  0.88 %    BMI (Calculated)  31.82    Single Leg Stand  8.9 seconds       Nutrition Therapy Plan and Nutrition Goals: Nutrition Therapy & Goals - 12/06/17 0847      Nutrition Therapy   Diet  DM/ TLC    Protein (specify units)  11oz    Fiber  25 grams    Whole Grain Foods  3 servings    Saturated Fats  14 max. grams    Fruits and Vegetables  5 servings/day   8 ideal, working to increase fruit and vegetable intake   Sodium  1500 grams      Personal Nutrition Goals   Nutrition Goal  Because you typically eat 1-2 meals per day, it may be a good idea to consume Glucerna nutritional drinks on a more consistent basis as a meal replacement or snack between meals.    Personal Goal #2  Continue to monitor sodium intake and choose foods with less than 14060mf sodium per serving  Personal Goal #3  Prioritize protein at meal times. Previously, you mentioned that dairy may be most feasible for you to eat reagularly. For example, drink a glass of milk or have a yogurt or cheese and crackers as a snack    Comments  She continues to c/o decreased appetite but feels that she has been doing well overall with diet. Her BG readings typically stay less than 200 and above 100. She is consuming some dairy and drinks Glucerna shakes "occasionally"      Intervention Plan   Intervention  Prescribe, educate and counsel regarding individualized specific dietary modifications aiming towards targeted core components such as weight, hypertension, lipid management, diabetes, heart failure and other comorbidities.    Expected Outcomes  Short Term Goal: Understand basic principles of dietary content, such as calories, fat, sodium, cholesterol and nutrients.;Short Term Goal: A plan has been developed with personal nutrition goals set during dietitian appointment.;Long Term Goal: Adherence to prescribed nutrition plan.        Nutrition Assessments: Nutrition Assessments - 01/26/18 1013      MEDFICTS Scores   Pre Score  6    Post Score  27    Score Difference  21       Nutrition Goals Re-Evaluation: Nutrition Goals Re-Evaluation    Row Name 12/06/17 0855 01/03/18 0819           Goals   Nutrition Goal  Because you typically eat 1-2 meals per day, it may be a good idea to consume Glucerna nutritional drinks on a more consistent basis as a meal replacement or snack between meals.  Consider drinking Glucerna nutritional drinks more consistently as a meal replacement d/t typically eating 1-2 meals/day for better BG control; monitor sodium intake more closely and aim to choose low sodium options; prioritize protein at meal times      Comment  She c/o decreased appetite but is a diabetic, requiring her to eat on a consistent schedule to best manage blood sugars  She continues to eat 1-2 meals a day, usually 1, and is drinking Glucerna shakes occasionally but not consistently. She is not monitoring sodium at this time but has been trying to focus on protein intake at her main meal of the day. She eats proteins like dairy products and beans once per week on average. Feels that her diet primarily consists of fruits and vegetables. She feels she is doing better overall health-wise      Expected Outcome  She will include Glucerna shakes on a more consistent basis to help better meet nutritional needs and manage BG   She will work to be more consistent with meal/snack intake and to be mindful of the amount of sodium she is consuming. Continue to eat a variety of fruits and vegetables and to priortize protein intake at her main meal        Personal Goal #2 Re-Evaluation   Personal Goal #2  Continue to monitor sodium intake and choose foods with less than 170m of sodium per serving  -        Personal Goal #3 Re-Evaluation   Personal Goal #3  Prioritize protein at meal times. Previously, you mentioned that diary may be  most feasible for you to eat regularly. For example, drink a glass of milk or have a yogurt or cheese and crackers as a snack  -         Nutrition Goals Discharge (Final Nutrition Goals Re-Evaluation): Nutrition Goals Re-Evaluation - 01/03/18 03668  Goals   Nutrition Goal  Consider drinking Glucerna nutritional drinks more consistently as a meal replacement d/t typically eating 1-2 meals/day for better BG control; monitor sodium intake more closely and aim to choose low sodium options; prioritize protein at meal times    Comment  She continues to eat 1-2 meals a day, usually 1, and is drinking Glucerna shakes occasionally but not consistently. She is not monitoring sodium at this time but has been trying to focus on protein intake at her main meal of the day. She eats proteins like dairy products and beans once per week on average. Feels that her diet primarily consists of fruits and vegetables. She feels she is doing better overall health-wise    Expected Outcome  She will work to be more consistent with meal/snack intake and to be mindful of the amount of sodium she is consuming. Continue to eat a variety of fruits and vegetables and to priortize protein intake at her main meal       Psychosocial: Target Goals: Acknowledge presence or absence of significant depression and/or stress, maximize coping skills, provide positive support system. Participant is able to verbalize types and ability to use techniques and skills needed for reducing stress and depression.   Initial Review & Psychosocial Screening: Initial Psych Review & Screening - 11/24/17 1533      Initial Review   Current issues with  History of Depression;Current Sleep Concerns;Current Stress Concerns    Source of Stress Concerns  Chronic Illness;Transportation;Unable to perform yard/household activities;Unable to participate in former interests or hobbies;Occupation    Comments  Venetta wants to go back to work.  She is still not  able to drive but has appointment to review this on 12/08/17.  Her family has been taking her places and she is using lyft with insurance to get to some appointments. She has been sleeping better since last here, but still not where she wants to be.  She currently denies an depression symptoms.       Family Dynamics   Good Support System?  Yes    Comments  Marticia's family is her support system.  Her sister brought her to orientation today.       Barriers   Psychosocial barriers to participate in program  The patient should benefit from training in stress management and relaxation.;Psychosocial barriers identified (see note)      Screening Interventions   Interventions  Encouraged to exercise;Program counselor consult;Provide feedback about the scores to participant    Expected Outcomes  Short Term goal: Utilizing psychosocial counselor, staff and physician to assist with identification of specific Stressors or current issues interfering with healing process. Setting desired goal for each stressor or current issue identified.;Long Term Goal: Stressors or current issues are controlled or eliminated.;Long Term goal: The participant improves quality of Life and PHQ9 Scores as seen by post scores and/or verbalization of changes;Short Term goal: Identification and review with participant of any Quality of Life or Depression concerns found by scoring the questionnaire.       Quality of Life Scores:  Quality of Life - 01/26/18 1012      Quality of Life   Select  Quality of Life      Quality of Life Scores   Health/Function Pre  17.43 %    Health/Function Post  19.92 %    Health/Function % Change  14.29 %    Socioeconomic Pre  15.71 %    Socioeconomic Post  17.29 %  Socioeconomic % Change   10.06 %    Psych/Spiritual Pre  23 %    Psych/Spiritual Post  24.5 %    Psych/Spiritual % Change  6.52 %    Family Pre  18.38 %    Family Post  25.13 %    Family % Change  36.72 %    GLOBAL Pre  18.39 %     GLOBAL Post  21.07 %    GLOBAL % Change  14.57 %      Scores of 19 and below usually indicate a poorer quality of life in these areas.  A difference of  2-3 points is a clinically meaningful difference.  A difference of 2-3 points in the total score of the Quality of Life Index has been associated with significant improvement in overall quality of life, self-image, physical symptoms, and general health in studies assessing change in quality of life.  PHQ-9: Recent Review Flowsheet Data    Depression screen [facility] 2/9 01/26/2018 12/13/2017 11/24/2017 08/04/2017 06/02/2017   Decreased Interest 1 0 0 1 2   Down, Depressed, Hopeless 0 0 0 0 1   PHQ - 2 Score 1 0 0 1 3   Altered sleeping 1 0 _0 Tired, decreased energy _1 Change in appetite _2 0 2   Feeling bad or failure about yourself  0 0 0 0 1   Trouble concentrating 0 0 0 0 1   Moving slowly or fidgety/restless _3 Suicidal thoughts 0 0 0 0 0   PHQ-9 Score _4 Difficult doing work/chores Somewhat difficult Not difficult at all Not difficult at all Not difficult at all Not difficult at all     Interpretation of Total Score  Total Score Depression Severity:  1-4 = Minimal depression, 5-9 = Mild depression, 10-14 = Moderate depression, 15-19 = Moderately severe depression, 20-27 = Severe depression   Psychosocial Evaluation and Intervention: Psychosocial Evaluation - 02/07/18 1005      Discharge Psychosocial Assessment & Intervention   Comments  Counselor met with Harlene today prior to her completion of this program later this week.  She reports so much progress made on her goals as she has gotten her energy and strength back and is now driving herself wherever she wants to go.  She continues to live with her daughters, but is hopeful now with her new freedom to drive to be able to get a job and live on her own again.  She is managing and coping well with stress in her life and has been such a positive and encouraging  patient in this program.  Meya reports walking daily to maintain her progress and plans to get into a [place] program in the near future.  Counselor commended Kay on the progress she made and her commitment to improved health and well-being.  She will be missed!       Psychosocial Re-Evaluation: Psychosocial Re-Evaluation    [place] Name 12/09/17 1058 12/20/17 0811 01/12/18 0827         Psychosocial Re-Evaluation   Current issues with  Current Stress Concerns  Current Stress Concerns;Current Sleep Concerns  Current Stress Concerns;Current Sleep Concerns     Comments  Candace [name] left message to call her back.  She is very overwhelmed with her limitations. She would like to return to work and graduate early.  She would like  to add in a third day. She is trying to work with her kids to get here.  She has been cleared to drive to store but feels this is such a limitation.  She got very emotional on the phone and wants to know what she can do to get to the doctor to clear her to drive sooner.  She is doing what she can but is very frustrated by everything.  She was saying she is very upset with her doctor as well as her daughter.  We will help her look into alternative means of transportation as well.   Candace [name] is doing better mentally.  She has come terms about not driving.  She has started to use Lyft with her Medicare today. She is going to try this and see how it goes.  She has made up with her daughter but still does not like that her daughter continues to give her instructions on what to do.  She does have a follow up in [place] with her neurologist again.  She is enjoying come to class and is starting to feel better overall. She did not sleep well over the weekend as her mind keeps running.   We talked about using some relaxation techniques to help relax her mind prior to going to sleep.   Candace [name] is back to driving some.  She is able to drive herself to class and doctor's appointments.  She is still not allowed to  drive at night.   She is sleeping well and is doing better with her daughter.      Expected Outcomes  Short: Look into other means of transportation.  Long: Continue to take the steps needed to return to driving.   Short: Continue to work with [employer] and try some relaxation techniques.  Long: Continue to cope positively.  Short: Continue to get to work toward full driving and exercise.  Long: Continue to cope positively.      Interventions  Stress management education;Encouraged to attend Cardiac Rehabilitation for the exercise  Stress management education;Encouraged to attend Cardiac Rehabilitation for the exercise  Encouraged to attend Cardiac Rehabilitation for the exercise     Continue Psychosocial Services   Follow up required by staff  Follow up required by staff  Follow up required by staff     Comments  Doctor only cleared her to drive back and forth to grocery store. She wants her independence back.   Doctor only cleared her to drive back and forth to grocery store. She wants her independence back.   -       Initial Review   Source of Stress Concerns  Chronic Illness;Transportation;Unable to perform yard/household activities;Unable to participate in former interests or hobbies;Occupation  Chronic Illness;Transportation;Unable to perform yard/household activities;Unable to participate in former interests or hobbies;Occupation  -        Psychosocial Discharge (Final Psychosocial Re-Evaluation): Psychosocial Re-Evaluation - 01/12/18 0827      Psychosocial Re-Evaluation   Current issues with  Current Stress Concerns;Current Sleep Concerns    Comments  Candace [name] is back to driving some.  She is able to drive herself to class and doctor's appointments.  She is still not allowed to drive at night.   She is sleeping well and is doing better with her daughter.     Expected Outcomes  Short: Continue to get to work toward full driving and exercise.  Long: Continue to cope positively.     Interventions   Encouraged to attend Cardiac Rehabilitation  for the exercise    Continue Psychosocial Services   Follow up required by staff       Vocational Rehabilitation: Provide vocational rehab assistance to qualifying candidates.   Vocational Rehab Evaluation & Intervention: Vocational Rehab - 11/24/17 1538      Initial Vocational Rehab Evaluation & Intervention   Assessment shows need for Vocational Rehabilitation  No       Education: Education Goals: Education classes will be provided on a variety of topics geared toward better understanding of heart health and risk factor modification. Participant will state understanding/return demonstration of topics presented as noted by education test scores.  Learning Barriers/Preferences: Learning Barriers/Preferences - 11/24/17 1537      Learning Barriers/Preferences   Learning Barriers  None    Learning Preferences  None       Education Topics:  AED/CPR: - Group verbal and written instruction with the use of models to demonstrate the basic use of the AED with the basic ABC's of resuscitation.   Cardiac Rehab from 02/08/2018 in [facility] Cardiac and Pulmonary Rehab  Date  01/26/18  Educator  MA  Instruction Review Code  1- Verbalizes Understanding      General Nutrition Guidelines/Fats and Fiber: -Group instruction provided by verbal, written material, models and posters to present the general guidelines for heart healthy nutrition. Gives an explanation and review of dietary fats and fiber.   Cardiac Rehab from 04/08/2016 in [facility] Cardiac and Pulmonary Rehab  Date  04/06/16  Educator  PI  Instruction Review Code (retired)  2- meets goals/outcomes      Controlling Sodium/Reading Food Labels: -Group verbal and written material supporting the discussion of sodium use in heart healthy nutrition. Review and explanation with models, verbal and written materials for utilization of the food label.   Cardiac Rehab from 02/08/2018 in [facility] Cardiac and  Pulmonary Rehab  Date  02/02/18  Educator  LB  Instruction Review Code  1- Verbalizes Understanding      Exercise Physiology & General Exercise Guidelines: - Group verbal and written instruction with models to review the exercise physiology of the cardiovascular system and associated critical values. Provides general exercise guidelines with specific guidelines to those with heart or lung disease.    Cardiac Rehab from 08/04/2017 in [facility] Cardiac and Pulmonary Rehab  Date  07/07/17  Educator  [facility]  Instruction Review Code  1- Verbalizes Understanding      Aerobic Exercise & Resistance Training: - Gives group verbal and written instruction on the various components of exercise. Focuses on aerobic and resistive training programs and the benefits of this training and how to safely progress through these programs..   Cardiac Rehab from 08/04/2017 in [facility] Cardiac and Pulmonary Rehab  Date  07/12/17  Educator  AS  Instruction Review Code  1- Verbalizes Understanding      Flexibility, Balance, Mind/Body Relaxation: Provides group verbal/written instruction on the benefits of flexibility and balance training, including mind/body exercise modes such as yoga, pilates and tai chi.  Demonstration and skill practice provided.   Cardiac Rehab from 02/08/2018 in [facility] Cardiac and Pulmonary Rehab  Date  01/03/18  Educator  AS  Instruction Review Code  1- Verbalizes Understanding      Stress and Anxiety: - Provides group verbal and written instruction about the health risks of elevated stress and causes of high stress.  Discuss the correlation between heart/lung disease and anxiety and treatment options. Review healthy ways to manage with stress and anxiety.   Cardiac Rehab  from 02/08/2018 in [place] Cardiac and Pulmonary Rehab  Date  01/11/18  Educator  [facility]  Instruction Review Code  1- Verbalizes Understanding      Depression: - Provides group verbal and written instruction on the correlation between  heart/lung disease and depressed mood, treatment options, and the stigmas associated with seeking treatment.   Cardiac Rehab from 02/08/2018 in [facility] Cardiac and Pulmonary Rehab  Date  12/28/17  Educator  [facility]  Instruction Review Code  5- Refused Teaching      Anatomy & Physiology of the Heart: - Group verbal and written instruction and models provide basic cardiac anatomy and physiology, with the coronary electrical and arterial systems. Review of Valvular disease and Heart Failure   Cardiac Rehab from 02/08/2018 in [facility] Cardiac and Pulmonary Rehab  Date  01/12/18  Educator  CE  Instruction Review Code  1- Verbalizes Understanding      Cardiac Procedures: - Group verbal and written instruction to review commonly prescribed medications for heart disease. Reviews the medication, class of the drug, and side effects. Includes the steps to properly store meds and maintain the prescription regimen. (beta blockers and nitrates)   Cardiac Rehab from 08/04/2017 in [facility] Cardiac and Pulmonary Rehab  Date  06/23/17  Educator  CE  Instruction Review Code  1- Verbalizes Understanding      Cardiac Medications I: - Group verbal and written instruction to review commonly prescribed medications for heart disease. Reviews the medication, class of the drug, and side effects. Includes the steps to properly store meds and maintain the prescription regimen.   Cardiac Rehab from 02/08/2018 in [facility] Cardiac and Pulmonary Rehab  Date  01/17/18  Educator  SB  Instruction Review Code  1- Verbalizes Understanding      Cardiac Medications II: -Group verbal and written instruction to review commonly prescribed medications for heart disease. Reviews the medication, class of the drug, and side effects. (all other drug classes)   Cardiac Rehab from 02/08/2018 in [facility] Cardiac and Pulmonary Rehab  Date  01/05/18  Educator  CE  Instruction Review Code  1- Verbalizes Understanding       Go Sex-Intimacy & Heart  Disease, Get SMART - Goal Setting: - Group verbal and written instruction through game format to discuss heart disease and the return to sexual intimacy. Provides group verbal and written material to discuss and apply goal setting through the application of the S.M.A.R.T. Method.   Cardiac Rehab from 08/04/2017 in [facility] Cardiac and Pulmonary Rehab  Date  06/23/17  Educator  CE  Instruction Review Code  1- Verbalizes Understanding      Other Matters of the Heart: - Provides group verbal, written materials and models to describe Stable Angina and Peripheral Artery. Includes description of the disease process and treatment options available to the cardiac patient.   Cardiac Rehab from 02/08/2018 in [facility] Cardiac and Pulmonary Rehab  Date  01/12/18  Educator  CE  Instruction Review Code  1- Verbalizes Understanding      Exercise & Equipment Safety: - Individual verbal instruction and demonstration of equipment use and safety with use of the equipment.   Cardiac Rehab from 02/08/2018 in [facility] Cardiac and Pulmonary Rehab  Date  11/24/17  Educator   [facility]  Instruction Review Code  1- Verbalizes Understanding      Infection Prevention: - Provides verbal and written material to individual with discussion of infection control including proper hand washing and proper equipment cleaning during exercise session.   Cardiac Rehab  from 02/08/2018 in [facility] Cardiac and Pulmonary Rehab  Date  11/24/17  Educator  [facility]  Instruction Review Code  1- Verbalizes Understanding      Falls Prevention: - Provides verbal and written material to individual with discussion of falls prevention and safety.   Cardiac Rehab from 02/08/2018 in [facility] Cardiac and Pulmonary Rehab  Date  11/24/17  Educator  [facility]  Instruction Review Code  1- Verbalizes Understanding      Diabetes: - Individual verbal and written instruction to review signs/symptoms of diabetes, desired ranges of glucose level fasting, after meals and with  exercise. Acknowledge that pre and post exercise glucose checks will be done for 3 sessions at entry of program.   Cardiac Rehab from 02/08/2018 in [facility] Cardiac and Pulmonary Rehab  Date  11/24/17  Educator  [facility]  Instruction Review Code  1- Verbalizes Understanding      Know Your Numbers and Risk Factors: -Group verbal and written instruction about important numbers in your health.  Discussion of what are risk factors and how they play a role in the disease process.  Review of Cholesterol, Blood Pressure, Diabetes, and BMI and the role they play in your overall health.   Cardiac Rehab from 02/08/2018 in [facility] Cardiac and Pulmonary Rehab  Date  01/05/18  Educator  CE  Instruction Review Code  1- Verbalizes Understanding      Sleep Hygiene: -Provides group verbal and written instruction about how sleep can affect your health.  Define sleep hygiene, discuss sleep cycles and impact of sleep habits. Review good sleep hygiene tips.    Cardiac Rehab from 02/08/2018 in [facility] Cardiac and Pulmonary Rehab  Date  02/08/18  Educator  [facility]  Instruction Review Code  1- Verbalizes Understanding      Other: -Provides group and verbal instruction on various topics (see comments)   Knowledge Questionnaire Score: Knowledge Questionnaire Score - 01/26/18 1012      Knowledge Questionnaire Score   Pre Score  24/26    Post Score  26/26       Core Components/Risk Factors/Patient Goals at Admission: Personal Goals and Risk Factors at Admission - 11/24/17 1539      Core Components/Risk Factors/Patient Goals on Admission    Weight Management  Yes;Obesity;Weight Loss    Intervention  Weight Management: Develop a combined nutrition and exercise program designed to reach desired caloric intake, while maintaining appropriate intake of nutrient and fiber, sodium and fats, and appropriate energy expenditure required for the weight goal.;Weight Management: Provide education and appropriate resources to help  participant work on and attain dietary goals.;Weight Management/Obesity: Establish reasonable short term and long term weight goals.;Obesity: Provide education and appropriate resources to help participant work on and attain dietary goals.    Admit Weight  169 lb 4.8 oz (76.8 kg)    Goal Weight: Short Term  159 lb (72.1 kg)    Goal Weight: Long Term  145 lb (65.8 kg)    Expected Outcomes  Short Term: Continue to assess and modify interventions until short term weight is achieved;Long Term: Adherence to nutrition and physical activity/exercise program aimed toward attainment of established weight goal;Weight Loss: Understanding of general recommendations for a balanced deficit meal plan, which promotes 1-2 lb weight loss per week and includes a negative energy balance of [phone] kcal/d;Understanding recommendations for meals to include 15-35% energy as protein, 25-35% energy from fat, 35-60% energy from carbohydrates, less than 258m of dietary cholesterol, 20-35 gm of total fiber daily;Understanding of distribution of  calorie intake throughout the day with the consumption of 4-5 meals/snacks    Diabetes  Yes    Intervention  Provide education about signs/symptoms and action to take for hypo/hyperglycemia.;Provide education about proper nutrition, including hydration, and aerobic/resistive exercise prescription along with prescribed medications to achieve blood glucose in normal ranges: Fasting glucose 65-99 mg/dL    Expected Outcomes  Short Term: Participant verbalizes understanding of the signs/symptoms and immediate care of hyper/hypoglycemia, proper foot care and importance of medication, aerobic/resistive exercise and nutrition plan for blood glucose control.;Long Term: Attainment of HbA1C < 7%.    Heart Failure  Yes    Intervention  Provide a combined exercise and nutrition program that is supplemented with education, support and counseling about heart failure. Directed toward relieving symptoms such  as shortness of breath, decreased exercise tolerance, and extremity edema.    Expected Outcomes  Improve functional capacity of life;Short term: Attendance in program 2-3 days a week with increased exercise capacity. Reported lower sodium intake. Reported increased fruit and vegetable intake. Reports medication compliance.;Short term: Daily weights obtained and reported for increase. Utilizing diuretic protocols set by physician.;Long term: Adoption of self-care skills and reduction of barriers for early signs and symptoms recognition and intervention leading to self-care maintenance.    Hypertension  Yes    Intervention  Provide education on lifestyle modifcations including regular physical activity/exercise, weight management, moderate sodium restriction and increased consumption of fresh fruit, vegetables, and low fat dairy, alcohol moderation, and smoking cessation.;Monitor prescription use compliance.    Expected Outcomes  Short Term: Continued assessment and intervention until BP is < 140/37m HG in hypertensive participants. < 130/851mHG in hypertensive participants with diabetes, heart failure or chronic kidney disease.;Long Term: Maintenance of blood pressure at goal levels.    Lipids  Yes    Intervention  Provide education and support for participant on nutrition & aerobic/resistive exercise along with prescribed medications to achieve LDL <7065mHDL >14m54m  Expected Outcomes  Short Term: Participant states understanding of desired cholesterol values and is compliant with medications prescribed. Participant is following exercise prescription and nutrition guidelines.;Long Term: Cholesterol controlled with medications as prescribed, with individualized exercise RX and with personalized nutrition plan. Value goals: LDL < 70mg4mL > 40 mg.       Core Components/Risk Factors/Patient Goals Review:  Goals and Risk Factor Review    Row Name 12/20/17 0816 01/12/18 0831           Core  Components/Risk Factors/Patient Goals Review   Personal Goals Review  Weight Management/Obesity;Hypertension;Diabetes;Lipids;Heart Failure  Weight Management/Obesity;Hypertension;Diabetes;Lipids;Heart Failure      Review  Candace [name] well in rehab. Her weight has been holding steady and she does weigh everyday. She has not had any heart failure symtpoms, denies swelling and SOB.  She has been taking her mediations and her numbers have been looking pretty good for her blood pressures and blood sugars.  She has been checking them occasionally at home for pressure but not her sugars.  We talked about that if she wants to come off her meds, she needs to be keeping records of her numbers.  She agreed to start checking more frequently and to record it.  She will continue to check her numbres daily.  Neva continues to do well in rehab.  She has been maintaining her weight and denies heart failure symptoms.  She is doing well with her blood pressures and blood sugars in generally.  Some days she runs a little  high, but overall she is doing well.  She still wants to come off of her meds, but she knows that they are helping.  She has been recording her numbers a little more frequently.       Expected Outcomes  Short: Check blood pressures and blood sugars more frequently.  Long: Continue to take medications and monitor heart failure symtoms.   Short: Continue to check more frequently and record it.  Long: Continue to take medications and work on heart failure.          Core Components/Risk Factors/Patient Goals at Discharge (Final Review):  Goals and Risk Factor Review - 01/12/18 0831      Core Components/Risk Factors/Patient Goals Review   Personal Goals Review  Weight Management/Obesity;Hypertension;Diabetes;Lipids;Heart Failure    Review  Candace [name] continues to do well in rehab.  She has been maintaining her weight and denies heart failure symptoms.  She is doing well with her blood pressures and blood sugars in  generally.  Some days she runs a little high, but overall she is doing well.  She still wants to come off of her meds, but she knows that they are helping.  She has been recording her numbers a little more frequently.     Expected Outcomes  Short: Continue to check more frequently and record it.  Long: Continue to take medications and work on heart failure.        ITP Comments: ITP Comments    Row Name 11/24/17 1231 11/24/17 1519 12/07/17 0623 01/04/18 0754 01/31/18 0826   ITP Comments  Candace [name] does not want to be on any medications and has stopped taking her medications.  Her heart rate was elevated today since she hasn't taken her .  We talked about her needing to take medications for her safety and will required to take them to attend rehab. She promises to take her heart meds and diabetes.   Medical evaluation completed.  Documentation can be found in [facility] encounter 10/29/17.  Initial ITP created and sent for review to Dr. Emily Filbert, Medical Director of Cardiac Rehab.   30 day review completed. ITP sent to Dr. Emily Filbert, Medical Director of Cardiac Rehab. Continue with ITP unless changes are made by physician  New to program  30 day review completed. ITP sent to Dr. Emily Filbert, Medical Director of Cardiac Rehab. Continue with ITP unless changes are made by physician  Candace [name] after the first minute of the walk test.  She stumbled at the door with the carpet to tile transition. She then tried to catch herself on the door and then to her knees and then she laid down for a minute to catch her breath and assess what happened.  She was helped back to up to lean against wall.  Eventually she was able to get up on her own to the chair.  Blood pressure was 136/78.  She skinned her knees a little and elbow feels sore from hitting door.  She was given some lotion for her knees and ice packs.  She chose to walk again after about 29mn of rest.  She was able to walk without a problem but her knees did hurt at the end  (6/10).  She was able to finish class today and used the NuStep with ice on her knees.  She states that she feels fine and is planning to go to her appointment with podiatry after class and will let [facility] if anything else comes up.  Candace [name] Name 02/01/18 0553 02/01/18 0906 02/02/18 0734       ITP Comments  30 day review. Continue with ITP unless direccted changes per Medical Director Chart Review.  Candace [name] was feeling more sore today after her fall yesterday.  She was planning to go to walk in clinic today to be evaluated since her bruising has worsened.   Candace [name] went to doctor yesterday after class to get her arm evaluated after her fall.  She had x-rays done and has a hairline fracture in her elbow.  She has broken this arm several time before.  She is to avoid using her arm for the most part so we will not use that arm for a bit.        Comments: Discharge ITP

## 2018-02-08 NOTE — Progress Notes (Signed)
Discharge Progress Report  Patient Details  Name: Candace [name] MRN: [id number] Date of Birth: [birth year] Referring Provider:     Cardiac Rehab from 11/24/2017 in [facility] Cardiac and Pulmonary Rehab  Referring Provider  [name]       Number of Visits: 36/36  Reason for Discharge:  Patient reached a stable level of exercise. Patient independent in their exercise. Patient has met program and personal goals.  Smoking History:  Social History   Tobacco Use  Smoking Status Former Smoker  . Packs/day: 0.50  . Types: Cigarettes  . Last attempt to quit: 10/07/2001  . Years since quitting: 16.3  Smokeless Tobacco Never Used    Diagnosis:  Chronic stable angina (HCC)  ADL UCSD:   Initial Exercise Prescription: Initial Exercise Prescription - 11/24/17 1500      Date of Initial Exercise RX and Referring Provider   Date  11/24/17    Referring Provider  [name]      Treadmill   MPH  2.1    Grade  1    Minutes  15    METs  2.91      NuStep   Level  2    SPM  80    Minutes  15    METs  2.9      REL-XR   Level  2    Speed  50    Minutes  15    METs  2.9      Prescription Details   Frequency (times per week)  2    Duration  Progress to 30 minutes of continuous aerobic without signs/symptoms of physical distress      Intensity   THRR 40-80% of Max Heartrate  121-143    Ratings of Perceived Exertion  11-13    Perceived Dyspnea  0-4      Progression   Progression  Continue to progress workloads to maintain intensity without signs/symptoms of physical distress.      Resistance Training   Training Prescription  Yes    Weight  3 lbs    Reps  10-15       Discharge Exercise Prescription (Final Exercise Prescription Changes): Exercise Prescription Changes - 01/23/18 1600      Response to Exercise   Blood Pressure (Admit)  126/72    Blood Pressure (Exercise)  156/86    Blood Pressure (Exit)  132/62    Heart Rate (Admit)  84 bpm    Heart  Rate (Exercise)  125 bpm    Heart Rate (Exit)  82 bpm    Rating of Perceived Exertion (Exercise)  12    Symptoms  none    Duration  Continue with 30 min of aerobic exercise without signs/symptoms of physical distress.    Intensity  THRR unchanged      Progression   Progression  Continue to progress workloads to maintain intensity without signs/symptoms of physical distress.    Average METs  4.08      Resistance Training   Training Prescription  Yes    Weight  3 lbs    Reps  10-15      Interval Training   Interval Training  No      NuStep   Level  5    Minutes  15    METs  5.2      REL-XR   Level  4    Minutes  15    METs  3.6  Track   Laps  53    Minutes  15    METs  3.45      Home Exercise Plan   Plans to continue exercise at  Home (comment)   walking   Frequency  Add 3 additional days to program exercise sessions.    Initial Home Exercises Provided  12/13/17       Functional Capacity: 6 Minute Walk    Row Name 11/24/17 1524 01/31/18 0818       6 Minute Walk   Phase  Initial  Discharge    Distance  1142 feet  1285 feet    Distance % Change  -  12.5 %    Distance Feet Change  -  143 ft    Walk Time  6 minutes  6 minutes    # of Rest Breaks  0  0    MPH  2.16  2.43    METS  3.5  3.07    RPE  9  14    Perceived Dyspnea   0  1    VO2 Peak  12.25  10.75    Symptoms  No  No    Resting HR  99 bpm  67 bpm    Resting BP  122/74  110/82    Resting Oxygen Saturation   95 %  98 %    Exercise Oxygen Saturation  during 6 min walk  98 %  99 %    Max Ex. HR  147 bpm  108 bpm    Max Ex. BP  174/88  158/74    2 Minute Post BP  134/84  134/70       Psychological, QOL, Others - Outcomes: PHQ 2/9: Depression screen [facility] 2/9 01/26/2018 12/13/2017 11/24/2017 08/04/2017 06/02/2017  Decreased Interest 1 0 0 1 2  Down, Depressed, Hopeless 0 0 0 0 1  PHQ - 2 Score 1 0 0 1 3  Altered sleeping 1 0 '1 3 3  ' Tired, decreased energy '1 1 1 2 2  ' Change in appetite '1 1 1 ' 0 2   Feeling bad or failure about yourself  0 0 0 0 1  Trouble concentrating 0 0 0 0 1  Moving slowly or fidgety/restless '1 1 1 1 2  ' Suicidal thoughts 0 0 0 0 0  PHQ-9 Score '5 3 4 7 14  ' Difficult doing work/chores Somewhat difficult Not difficult at all Not difficult at all Not difficult at all Not difficult at all  Some recent data might be hidden    Quality of Life: Quality of Life - 01/26/18 1012      Quality of Life   Select  Quality of Life      Quality of Life Scores   Health/Function Pre  17.43 %    Health/Function Post  19.92 %    Health/Function % Change  14.29 %    Socioeconomic Pre  15.71 %    Socioeconomic Post  17.29 %    Socioeconomic % Change   10.06 %    Psych/Spiritual Pre  23 %    Psych/Spiritual Post  24.5 %    Psych/Spiritual % Change  6.52 %    Family Pre  18.38 %    Family Post  25.13 %    Family % Change  36.72 %    GLOBAL Pre  18.39 %    GLOBAL Post  21.07 %    GLOBAL % Change  14.57 %  Personal Goals: Goals established at orientation with interventions provided to work toward goal. Personal Goals and Risk Factors at Admission - 11/24/17 1539      Core Components/Risk Factors/Patient Goals on Admission    Weight Management  Yes;Obesity;Weight Loss    Intervention  Weight Management: Develop a combined nutrition and exercise program designed to reach desired caloric intake, while maintaining appropriate intake of nutrient and fiber, sodium and fats, and appropriate energy expenditure required for the weight goal.;Weight Management: Provide education and appropriate resources to help participant work on and attain dietary goals.;Weight Management/Obesity: Establish reasonable short term and long term weight goals.;Obesity: Provide education and appropriate resources to help participant work on and attain dietary goals.    Admit Weight  169 lb 4.8 oz (76.8 kg)    Goal Weight: Short Term  159 lb (72.1 kg)    Goal Weight: Long Term  145 lb (65.8 kg)     Expected Outcomes  Short Term: Continue to assess and modify interventions until short term weight is achieved;Long Term: Adherence to nutrition and physical activity/exercise program aimed toward attainment of established weight goal;Weight Loss: Understanding of general recommendations for a balanced deficit meal plan, which promotes 1-2 lb weight loss per week and includes a negative energy balance of [phone] kcal/d;Understanding recommendations for meals to include 15-35% energy as protein, 25-35% energy from fat, 35-60% energy from carbohydrates, less than 234m of dietary cholesterol, 20-35 gm of total fiber daily;Understanding of distribution of calorie intake throughout the day with the consumption of 4-5 meals/snacks    Diabetes  Yes    Intervention  Provide education about signs/symptoms and action to take for hypo/hyperglycemia.;Provide education about proper nutrition, including hydration, and aerobic/resistive exercise prescription along with prescribed medications to achieve blood glucose in normal ranges: Fasting glucose 65-99 mg/dL    Expected Outcomes  Short Term: Participant verbalizes understanding of the signs/symptoms and immediate care of hyper/hypoglycemia, proper foot care and importance of medication, aerobic/resistive exercise and nutrition plan for blood glucose control.;Long Term: Attainment of HbA1C < 7%.    Heart Failure  Yes    Intervention  Provide a combined exercise and nutrition program that is supplemented with education, support and counseling about heart failure. Directed toward relieving symptoms such as shortness of breath, decreased exercise tolerance, and extremity edema.    Expected Outcomes  Improve functional capacity of life;Short term: Attendance in program 2-3 days a week with increased exercise capacity. Reported lower sodium intake. Reported increased fruit and vegetable intake. Reports medication compliance.;Short term: Daily weights obtained and reported for  increase. Utilizing diuretic protocols set by physician.;Long term: Adoption of self-care skills and reduction of barriers for early signs and symptoms recognition and intervention leading to self-care maintenance.    Hypertension  Yes    Intervention  Provide education on lifestyle modifcations including regular physical activity/exercise, weight management, moderate sodium restriction and increased consumption of fresh fruit, vegetables, and low fat dairy, alcohol moderation, and smoking cessation.;Monitor prescription use compliance.    Expected Outcomes  Short Term: Continued assessment and intervention until BP is < 140/938mHG in hypertensive participants. < 130/8087mG in hypertensive participants with diabetes, heart failure or chronic kidney disease.;Long Term: Maintenance of blood pressure at goal levels.    Lipids  Yes    Intervention  Provide education and support for participant on nutrition & aerobic/resistive exercise along with prescribed medications to achieve LDL <58m27mDL >40mg59m Expected Outcomes  Short Term: Participant states understanding of desired  cholesterol values and is compliant with medications prescribed. Participant is following exercise prescription and nutrition guidelines.;Long Term: Cholesterol controlled with medications as prescribed, with individualized exercise RX and with personalized nutrition plan. Value goals: LDL < 65m, HDL > 40 mg.        Personal Goals Discharge: Goals and Risk Factor Review    Row Name 12/20/17 0816 01/12/18 0831           Core Components/Risk Factors/Patient Goals Review   Personal Goals Review  Weight Management/Obesity;Hypertension;Diabetes;Lipids;Heart Failure  Weight Management/Obesity;Hypertension;Diabetes;Lipids;Heart Failure      Review  Candace [name] doing well in rehab. Her weight has been holding steady and she does weigh everyday. She has not had any heart failure symtpoms, denies swelling and SOB.  She has been taking her  mediations and her numbers have been looking pretty good for her blood pressures and blood sugars.  She has been checking them occasionally at home for pressure but not her sugars.  We talked about that if she wants to come off her meds, she needs to be keeping records of her numbers.  She agreed to start checking more frequently and to record it.  She will continue to check her numbres daily.  Candace [name] continues to do well in rehab.  She has been maintaining her weight and denies heart failure symptoms.  She is doing well with her blood pressures and blood sugars in generally.  Some days she runs a little high, but overall she is doing well.  She still wants to come off of her meds, but she knows that they are helping.  She has been recording her numbers a little more frequently.       Expected Outcomes  Short: Check blood pressures and blood sugars more frequently.  Long: Continue to take medications and monitor heart failure symtoms.   Short: Continue to check more frequently and record it.  Long: Continue to take medications and work on heart failure.          Exercise Goals and Review: Exercise Goals    Row Name 11/24/17 1530             Exercise Goals   Increase Physical Activity  Yes       Intervention  Provide advice, education, support and counseling about physical activity/exercise needs.;Develop an individualized exercise prescription for aerobic and resistive training based on initial evaluation findings, risk stratification, comorbidities and participant's personal goals.       Expected Outcomes  Short Term: Attend rehab on a regular basis to increase amount of physical activity.;Long Term: Add in home exercise to make exercise part of routine and to increase amount of physical activity.;Long Term: Exercising regularly at least 3-5 days a week.       Increase Strength and Stamina  Yes       Intervention  Provide advice, education, support and counseling about physical activity/exercise  needs.;Develop an individualized exercise prescription for aerobic and resistive training based on initial evaluation findings, risk stratification, comorbidities and participant's personal goals.       Expected Outcomes  Short Term: Increase workloads from initial exercise prescription for resistance, speed, and METs.;Short Term: Perform resistance training exercises routinely during rehab and add in resistance training at home;Long Term: Improve cardiorespiratory fitness, muscular endurance and strength as measured by increased METs and functional capacity (6MWT)       Able to understand and use rate of perceived exertion (RPE) scale  Yes  Intervention  Provide education and explanation on how to use RPE scale       Expected Outcomes  Short Term: Able to use RPE daily in rehab to express subjective intensity level;Long Term:  Able to use RPE to guide intensity level when exercising independently       Able to understand and use Dyspnea scale  Yes       Intervention  Provide education and explanation on how to use Dyspnea scale       Expected Outcomes  Short Term: Able to use Dyspnea scale daily in rehab to express subjective sense of shortness of breath during exertion;Long Term: Able to use Dyspnea scale to guide intensity level when exercising independently       Knowledge and understanding of Target Heart Rate Range (THRR)  Yes       Intervention  Provide education and explanation of THRR including how the numbers were predicted and where they are located for reference       Expected Outcomes  Short Term: Able to use daily as guideline for intensity in rehab;Short Term: Able to state/look up THRR;Long Term: Able to use THRR to govern intensity when exercising independently       Able to check pulse independently  Yes       Intervention  Provide education and demonstration on how to check pulse in carotid and radial arteries.;Review the importance of being able to check your own pulse for safety  during independent exercise       Expected Outcomes  Short Term: Able to explain why pulse checking is important during independent exercise;Long Term: Able to check pulse independently and accurately       Understanding of Exercise Prescription  Yes       Intervention  Provide education, explanation, and written materials on patient's individual exercise prescription       Expected Outcomes  Short Term: Able to explain program exercise prescription;Long Term: Able to explain home exercise prescription to exercise independently          Exercise Goals Re-Evaluation: Exercise Goals Re-Evaluation    Row Name 12/06/17 0834 12/13/17 0819 12/20/17 0824 12/26/17 1555 01/09/18 1605     Exercise Goal Re-Evaluation   Exercise Goals Review  Increase Physical Activity;Increase Strength and Stamina;Able to understand and use rate of perceived exertion (RPE) scale;Knowledge and understanding of Target Heart Rate Range (THRR);Understanding of Exercise Prescription  Increase Physical Activity;Increase Strength and Stamina;Able to understand and use rate of perceived exertion (RPE) scale;Knowledge and understanding of Target Heart Rate Range (THRR);Understanding of Exercise Prescription;Able to check pulse independently  Increase Physical Activity;Increase Strength and Stamina;Understanding of Exercise Prescription  Increase Physical Activity;Increase Strength and Stamina;Understanding of Exercise Prescription  Increase Physical Activity;Increase Strength and Stamina;Understanding of Exercise Prescription   Comments  Reviewed RPE scale, THR and program prescription with pt today.  Pt voiced understanding and was given a copy of goals to take home.   Reviewed home exercise with pt today.  Pt plans to walk at home for exercise.  Reviewed THR, pulse, RPE, sign and symptoms, NTG use, and when to call 911 or MD.  Also discussed weather considerations and indoor options.  Pt voiced understanding.  Candace [name] has been doing well in  rehab. She is walking 3 days a week at home for 41mn.  This past week she went Friday, Saturday, and Sunday.  She feels like her strength and stamina are starting to come back too!!    Candace [name] to  do well in rehab. She has moved from the treadmill back to the track as she really enjoys dancing around!  We will continue to monitor her progress.   Candace [name] has started to attend rehab three days a week (Tues/Wed/Thurs).  She is up to 68 laps on the track.  We will continue to monitor her progression.    Expected Outcomes  Short: Use RPE daily to regulate intensity. Long: Follow program prescription in THR.  Short: Start walking at home on own.  Long: Continue to build strength and stamina.   Short: Continue to increase walking at home.  Long: Continue to work on Animator.   Short: Increase level on XR.  Long: Continue to walk more at home.   Short: Continue to increase workloads.  Long: Continue to increase activity levels.    [place] Name 01/12/18 0820 01/23/18 1628           Exercise Goal Re-Evaluation   Exercise Goals Review  Increase Physical Activity;Increase Strength and Stamina;Understanding of Exercise Prescription  Increase Physical Activity;Increase Strength and Stamina;Understanding of Exercise Prescription      Comments  Candace [name] is doing well in rehab.  She is busy dancing around the track.  She is starting to get her strength and stamina back.  She is walking at home everyday!!  Candace [name] continues to do well in rehab.  She tried to get back up on the treadmill but was walking slower than she dances around the track so she went back to the track.  She is also up to 5.2 METs on the NuStep.  We will continue to monitor her progress.       Expected Outcomes  Short: Continue to add in walking at home  Long: Continue to increase strength and stamina.   Short: Increase workload on XR.  Long: Continue to improve strength and stamina as she wants to work and drive again.          Nutrition &  Weight - Outcomes: Pre Biometrics - 11/24/17 1532      Pre Biometrics   Height  '5\' 2"'  (1.575 m)    Weight  169 lb 4.8 oz (76.8 kg)    Waist Circumference  36.5 inches    Hip Circumference  40.25 inches    Waist to Hip Ratio  0.91 %    BMI (Calculated)  30.96    Single Leg Stand  5.78 seconds      Post Biometrics - 01/31/18 0819       Post  Biometrics   Height  '5\' 2"'  (1.575 m)    Weight  174 lb (78.9 kg)    Waist Circumference  37 inches    Hip Circumference  42 inches    Waist to Hip Ratio  0.88 %    BMI (Calculated)  31.82    Single Leg Stand  8.9 seconds       Nutrition: Nutrition Therapy & Goals - 12/06/17 0847      Nutrition Therapy   Diet  DM/ TLC    Protein (specify units)  11oz    Fiber  25 grams    Whole Grain Foods  3 servings    Saturated Fats  14 max. grams    Fruits and Vegetables  5 servings/day   8 ideal, working to increase fruit and vegetable intake   Sodium  1500 grams      Personal Nutrition Goals   Nutrition Goal  Because you typically eat 1-2  meals per day, it may be a good idea to consume Glucerna nutritional drinks on a more consistent basis as a meal replacement or snack between meals.    Personal Goal #2  Continue to monitor sodium intake and choose foods with less than 171m of sodium per serving    Personal Goal #3  Prioritize protein at meal times. Previously, you mentioned that dairy may be most feasible for you to eat reagularly. For example, drink a glass of milk or have a yogurt or cheese and crackers as a snack    Comments  She continues to c/o decreased appetite but feels that she has been doing well overall with diet. Her BG readings typically stay less than 200 and above 100. She is consuming some dairy and drinks Glucerna shakes "occasionally"      Intervention Plan   Intervention  Prescribe, educate and counsel regarding individualized specific dietary modifications aiming towards targeted core components such as weight, hypertension,  lipid management, diabetes, heart failure and other comorbidities.    Expected Outcomes  Short Term Goal: Understand basic principles of dietary content, such as calories, fat, sodium, cholesterol and nutrients.;Short Term Goal: A plan has been developed with personal nutrition goals set during dietitian appointment.;Long Term Goal: Adherence to prescribed nutrition plan.       Nutrition Discharge: Nutrition Assessments - 01/26/18 1013      MEDFICTS Scores   Pre Score  6    Post Score  27    Score Difference  21       Education Questionnaire Score: Knowledge Questionnaire Score - 01/26/18 1012      Knowledge Questionnaire Score   Pre Score  24/26    Post Score  26/26       Goals reviewed with patient; copy given to patient.

## 2018-02-08 NOTE — Progress Notes (Signed)
Daily Session Note  Patient Details  Name: Candace [name] MRN: [id number] Date of Birth: Feb 11, 1951 Referring Provider:     Cardiac Rehab from 11/24/2017 in [facility] Cardiac and Pulmonary Rehab  Referring Provider  Candace Royals MD      Encounter Date: 02/08/2018  Check In: Session Check In - 02/08/18 0740      Check-In   Supervising physician immediately available to respond to emergencies  See telemetry face sheet for immediately available ER MD    Location  [facility]-Cardiac & Pulmonary Rehab    Staff Present  Justin Mend Lorre Nick, MA, RCEP, CCRP, Exercise Physiologist;Susanne Bice, RN, BSN, CCRP    Medication changes reported      No    Fall or balance concerns reported     No    Warm-up and Cool-down  Performed on first and last piece of equipment    Resistance Training Performed  Yes    VAD Patient?  No    PAD/SET Patient?  No      Pain Assessment   Currently in Pain?  No/denies          Social History   Tobacco Use  Smoking Status Former Smoker  . Packs/day: 0.50  . Types: Cigarettes  . Last attempt to quit: 10/07/2001  . Years since quitting: 16.3  Smokeless Tobacco Never Used    Goals Met:  Independence with exercise equipment Exercise tolerated well No report of cardiac concerns or symptoms Strength training completed today  Goals Unmet:  Not Applicable  Comments:  Candace [name] graduated today from  rehab with 36 sessions completed.  Details of the patient's exercise prescription and what She needs to do in order to continue the prescription and progress were discussed with patient.  Patient was given a copy of prescription and goals.  Patient verbalized understanding.  Candace [name] plans to continue to exercise by walking at home.    Dr. Emily [name] is Medical Director for [facility].

## 2018-02-28 ENCOUNTER — Encounter (INDEPENDENT_AMBULATORY_CARE_PROVIDER_SITE_OTHER): Payer: Self-pay | Admitting: Vascular Surgery

## 2018-02-28 ENCOUNTER — Ambulatory Visit (INDEPENDENT_AMBULATORY_CARE_PROVIDER_SITE_OTHER): Payer: Medicare Other | Admitting: Vascular Surgery

## 2018-02-28 VITALS — BP 182/103 | HR 79 | Resp 18 | Ht 62.0 in | Wt 175.0 lb

## 2018-02-28 DIAGNOSIS — E782 Mixed hyperlipidemia: Secondary | ICD-10-CM | POA: Diagnosis not present

## 2018-02-28 DIAGNOSIS — I10 Essential (primary) hypertension: Secondary | ICD-10-CM

## 2018-02-28 DIAGNOSIS — M79604 Pain in right leg: Secondary | ICD-10-CM | POA: Diagnosis not present

## 2018-02-28 DIAGNOSIS — E1159 Type 2 diabetes mellitus with other circulatory complications: Secondary | ICD-10-CM

## 2018-02-28 DIAGNOSIS — M79605 Pain in left leg: Secondary | ICD-10-CM

## 2018-02-28 DIAGNOSIS — M79609 Pain in unspecified limb: Secondary | ICD-10-CM | POA: Insufficient documentation

## 2018-02-28 NOTE — Patient Instructions (Signed)

## 2018-02-28 NOTE — Assessment & Plan Note (Signed)
blood pressure control important in reducing the progression of atherosclerotic disease. On appropriate oral medications.  

## 2018-02-28 NOTE — Assessment & Plan Note (Signed)
lipid control important in reducing the progression of atherosclerotic disease. Continue statin therapy  

## 2018-02-28 NOTE — Assessment & Plan Note (Signed)
blood glucose control important in reducing the progression of atherosclerotic disease. Also, involved in wound healing. On appropriate medications.  

## 2018-02-28 NOTE — Assessment & Plan Note (Signed)
The patient describes lower extremity symptoms that are not entirely clear in their etiology, but do sound worrisome for peripheral arterial disease with claudication.  She has a litany of atherosclerotic risk factors.  Noninvasive studies will be performed in the near future at her convenience to assess her for PAD.  We had a long discussion regarding the natural history of PAD and the treatment options.  She is on appropriate medical therapy already.  I will see her back following her study.

## 2018-02-28 NOTE — Progress Notes (Signed)
Patient ID: Candace [name], female   DOB: 1950/05/02, 67 y.o.   MRN: [id number]  Chief Complaint  Patient presents with  . New Patient (Initial Visit)    Claudication    HPI Candace [name] is a 67 y.o. female.  I am asked to see the patient by Dr. Rosanna Randy for evaluation of leg pain.  The patient reports pain in her legs with activity over the past year or so.  The right leg is the more severely affected of the 2 legs.  There is no clear causative event or precipitating factor that started the symptoms.  The left leg bothers her some as well although not as severe as the right.  No ulceration or infection.  She notices the pain sometimes at rest particularly when laying flat or in certain positions.  She has a large number of atherosclerotic risk factors as listed below.  She is on appropriate medical therapy with dual antiplatelet therapy and a statin agent.  Her symptoms have been steadily progressing over time.  She notices a cramp in her calf and knee area with walking only short distances at this point.  No chest pain or shortness of breath.  No fever or chills.   Past Medical History:  Diagnosis Date  . Allergy   . Anemia   . Anxiety   . Arrhythmia   . Arthritis   . Coronary artery disease   . Depression   . Diabetes mellitus without complication ([place])   . Dyspnea    doe  . Dysrhythmia   . GERD (gastroesophageal reflux disease)   . Headache   . History of hiatal hernia   . Hyperlipidemia   . Hypertension   . Myocardial infarction ([place])    2016, 04/2017  . Myocardial infarction with cardiac rehabilitation [facility])    MI 2016/ CABG 8/17    FINISHED CARDIAC REHAB 3 WEEKS AGO  . Panic attack   . Pneumonia   . Reflux   . Stroke [facility]) 2015   showed up on MRI; no weakness noted  . TIA (transient ischemic attack)   . Voice tremor     Past Surgical History:  Procedure Laterality Date  . ABDOMINAL HYSTERECTOMY    . APPENDECTOMY  1975  . ARTERY BIOPSY Right 04/26/2016   Procedure: BIOPSY TEMPORAL ARTERY;  Surgeon: Margaretha Sheffield, MD;  Location: [facility] ORS;  Service: ENT;  Laterality: Right;  . CARDIAC CATHETERIZATION N/A 11/06/2015   Procedure: Left Heart Cath and Coronary Angiography;  Surgeon: Corey Skains, MD;  Location: [place] CV LAB;  Service: Cardiovascular;  Laterality: N/A;  . CESAREAN SECTION    . COLONOSCOPY  2015  . CORONARY ANGIOPLASTY  04/2017   [place]  . CORONARY ARTERY BYPASS GRAFT N/A 11/10/2015   Procedure: CORONARY ARTERY BYPASS GRAFTING (CABG), ON PUMP, TIMES FOUR, USING LEFT INTERNAL MAMMARY ARTERY, BILATERAL GREATER SAPHENOUS VEINS HARVESTED ENDOSCOPICALLY;  Surgeon: [name], MD;  Location: [place];  Service: Open Heart Surgery;  Laterality: N/A;  LIMA-LAD; SEQ SVG-OM1-OM2; SVG-PL  . CORONARY STENT INTERVENTION N/A 08/05/2016   Procedure: Coronary Stent Intervention;  Surgeon: Isaias Cowman, MD;  Location: [place] CV LAB;  Service: Cardiovascular;  Laterality: N/A;  . HYSTERECTOMY ABDOMINAL WITH SALPINGO-OOPHORECTOMY Bilateral 08/15/2017   Procedure: HYSTERECTOMY ABDOMINAL WITH BILATERAL SALPINGO-OOPHORECTOMY;  Surgeon: Rubie Maid, MD;  Location: [facility] ORS;  Service: Gynecology;  Laterality: Bilateral;  . LEFT HEART CATH AND CORONARY ANGIOGRAPHY N/A 08/05/2016   Procedure: Left  Heart Cath and Coronary Angiography;  Surgeon: Isaias Cowman, MD;  Location: [place] CV LAB;  Service: Cardiovascular;  Laterality: N/A;  . TEE WITHOUT CARDIOVERSION N/A 11/10/2015   Procedure: TRANSESOPHAGEAL ECHOCARDIOGRAM (TEE);  Surgeon: [name], MD;  Location: [place];  Service: Open Heart Surgery;  Laterality: N/A;  . TUBAL LIGATION      Family History  Problem Relation Age of Onset  . Cancer Father   . Hypertension Father   . Heart disease Father   . Asthma Father   . Cancer Mother   . Hypertension Mother   . Pancreatic cancer Mother 87  . Cancer Sister   . Breast cancer Sister 78  . Breast  cancer Sister 69  . Lung cancer Brother   . Pancreatic cancer Sister 85  . Cancer Sister      Social History Social History   Tobacco Use  . Smoking status: Former Smoker    Packs/day: 0.50    Types: Cigarettes    Last attempt to quit: 10/07/2001    Years since quitting: 16.4  . Smokeless tobacco: Never Used  Substance Use Topics  . Alcohol use: No    Alcohol/week: 0.0 standard drinks  . Drug use: No     Allergies  Allergen Reactions  . Lisinopril     cough  . Penicillins Swelling, Rash and Other (See Comments)    Has patient had a PCN reaction causing immediate rash, facial/tongue/throat swelling, SOB or lightheadedness with hypotension: Yes Has patient had a PCN reaction causing severe rash involving mucus membranes or skin necrosis: Yes Has patient had a PCN reaction that required hospitalization No Has patient had a PCN reaction occurring within the last 10 years: Yes If all of the above answers are "NO", then may proceed with Cephalosporin use.     Current Outpatient Medications  Medication Sig Dispense Refill  . acetaminophen (TYLENOL) 500 MG tablet Take 2 tablets (1,000 mg total) by mouth every 8 (eight) hours as needed. 60 tablet 1  . clopidogrel (PLAVIX) 75 MG tablet Take 75 mg by mouth daily.    . Coenzyme Q10 (CO Q 10) 100 MG CAPS Take 100 mg by mouth daily. 30 capsule 12  . [facility] ASPIRIN ADULT LOW DOSE 81 MG chewable tablet CHEW 1 TABLET (81 MG TOTAL) BY MOUTH DAILY. 90 tablet 3  . cyproheptadine (PERIACTIN) 4 MG tablet Take 4 mg by mouth 3 (three) times daily as needed for allergies.    . furosemide (LASIX) 20 MG tablet Take 20 mg by mouth daily.    Marland Kitchen HYDROcodone-acetaminophen (NORCO/VICODIN) 5-325 MG tablet Take one tablet at night for pain; may take up to every 6 hours as needed for pain if not working or driving    . isosorbide mononitrate (IMDUR) 30 MG 24 hr tablet Take 2 tablets (60 mg total) by mouth daily. 60 tablet 12  . loperamide (IMODIUM) 2 MG  capsule Take by mouth as needed for diarrhea or loose stools.    . metFORMIN (GLUCOPHAGE) 1000 MG tablet Take 1 tablet (1,000 mg total) by mouth 2 (two) times daily with a meal. 180 tablet 3  . metoprolol tartrate (LOPRESSOR) 50 MG tablet TAKE 1 TABLET BY MOUTH TWICE A DAY FOR HIGH BLOOD PRESSURE 60 tablet 12  . nitroGLYCERIN (NITROSTAT) 0.4 MG SL tablet Place 1 tablet (0.4 mg total) under the tongue every 5 (five) minutes as needed for chest pain. 30 tablet 0  . ondansetron (ZOFRAN-ODT) 8 MG disintegrating tablet DISSOLVE  1 TABLET ON THE TONGUE EVERY 8 HOURS AS NEEDED FOR NAUSEA OR VOMITING 30 tablet 1  . rosuvastatin (CRESTOR) 10 MG tablet Take 1 tablet (10 mg total) by mouth daily. 90 tablet 3  . sucralfate (CARAFATE) 1 g tablet Take 1 tablet (1 g total) by mouth 2 (two) times daily. 60 tablet 5  . tiZANidine (ZANAFLEX) 2 MG tablet Take 2 mg by mouth at bedtime.     Marland Kitchen omeprazole (PRILOSEC) 20 MG capsule TAKE 1 CAPSULE BY MOUTH EVERY DAY (Patient not taking: Reported on 02/28/2018) 90 capsule 1   No current facility-administered medications for this visit.       REVIEW OF SYSTEMS (Negative unless checked)  Constitutional: [] Weight loss  [] Fever  [] Chills Cardiac: [] Chest pain   [] Chest pressure   [x] Palpitations   [] Shortness of breath when laying flat   [] Shortness of breath at rest   [] Shortness of breath with exertion. Vascular:  [x] Pain in legs with walking   [] Pain in legs at rest   [] Pain in legs when laying flat   [x] Claudication   [] Pain in feet when walking  [] Pain in feet at rest  [] Pain in feet when laying flat   [] History of DVT   [] Phlebitis   [] Swelling in legs   [] Varicose veins   [] Non-healing ulcers Pulmonary:   [] Uses home oxygen   [] Productive cough   [] Hemoptysis   [] Wheeze  [] COPD   [] Asthma Neurologic:  [] Dizziness  [] Blackouts   [] Seizures   [x] History of stroke   [x] History of TIA  [] Aphasia   [] Temporary blindness   [] Dysphagia   [] Weakness or numbness in arms    [] Weakness or numbness in legs Musculoskeletal:  [x] Arthritis   [] Joint swelling   [] Joint pain   [] Low back pain Hematologic:  [] Easy bruising  [] Easy bleeding   [] Hypercoagulable state   [] Anemic  [] Hepatitis Gastrointestinal:  [] Blood in stool   [] Vomiting blood  [x] Gastroesophageal reflux/heartburn   [] Abdominal pain Genitourinary:  [] Chronic kidney disease   [] Difficult urination  [] Frequent urination  [] Burning with urination   [] Hematuria Skin:  [] Rashes   [] Ulcers   [] Wounds Psychological:  [x] History of anxiety   []  History of major depression.    Physical Exam BP (!) 182/103 (BP Location: Right Arm, Patient Position: Sitting)   Pulse 79   Resp 18   Ht 5\' 2"  (1.575 m)   Wt 175 lb (79.4 kg)   BMI 32.01 kg/m  Gen:  WD/WN, NAD Head: [place]/AT, No temporalis wasting.  Ear/Nose/Throat: Hearing grossly intact, nares w/o erythema or drainage, oropharynx w/o Erythema/Exudate Eyes: Conjunctiva clear, sclera non-icteric  Neck: trachea midline.  Pulmonary:  Good air movement, respirations not labored, no use of accessory muscles Cardiac: RRR, no JVD Vascular:  Vessel Right Left  Radial Palpable Palpable                          PT  1+ palpable  1+ palpable  DP  1+ palpable Palpable   Gastrointestinal: soft, non-tender/non-distended.  Musculoskeletal: M/S 5/5 throughout.  Extremities without ischemic changes.  No deformity or atrophy.  Trace lower extremity edema. Neurologic: Sensation grossly intact in extremities.  Symmetrical.  Speech is fluent. Motor exam as listed above. Psychiatric: Judgment intact, Mood & affect appropriate for pt's clinical situation. Dermatologic: No rashes or ulcers noted.  No cellulitis or open wounds.    Radiology No results found.  Labs Recent Results (from the past 2160 hour(s))  Glucose, capillary  Status: Abnormal   Collection Time: 12/15/17  8:31 AM  Result Value Ref Range   Glucose-Capillary 163 (H) 70 - 99 mg/dL  Glucose, capillary      Status: Abnormal   Collection Time: 12/20/17  7:46 AM  Result Value Ref Range   Glucose-Capillary 116 (H) 70 - 99 mg/dL  Glucose, capillary     Status: Abnormal   Collection Time: 12/20/17  8:57 AM  Result Value Ref Range   Glucose-Capillary 141 (H) 70 - 99 mg/dL  Glucose, capillary     Status: Abnormal   Collection Time: 01/03/18  7:57 AM  Result Value Ref Range   Glucose-Capillary 170 (H) 70 - 99 mg/dL  Glucose, capillary     Status: Abnormal   Collection Time: 01/03/18  9:01 AM  Result Value Ref Range   Glucose-Capillary 139 (H) 70 - 99 mg/dL  Hemoglobin A1c     Status: Abnormal   Collection Time: 01/12/18 10:14 AM  Result Value Ref Range   Hgb A1c MFr Bld 8.2 (H) 4.8 - 5.6 %    Comment:          Prediabetes: 5.7 - 6.4          Diabetes: >6.4          Glycemic control for adults with diabetes: <7.0    Est. average glucose Bld gHb Est-mCnc 189 mg/dL  Comprehensive metabolic panel     Status: Abnormal   Collection Time: 01/12/18 10:14 AM  Result Value Ref Range   Glucose 161 (H) 65 - 99 mg/dL   BUN 13 8 - 27 mg/dL   Creatinine, Ser 0.80 0.57 - 1.00 mg/dL   GFR calc non Af Amer 77 >59 mL/min/1.73   GFR calc Af Amer 89 >59 mL/min/1.73   BUN/Creatinine Ratio 16 12 - 28   Sodium 144 134 - 144 mmol/L   Potassium 4.6 3.5 - 5.2 mmol/L   Chloride 105 96 - 106 mmol/L   CO2 16 (L) 20 - 29 mmol/L   Calcium 9.6 8.7 - 10.3 mg/dL   Total Protein 6.9 6.0 - 8.5 g/dL   Albumin 4.5 3.6 - 4.8 g/dL   Globulin, Total 2.4 1.5 - 4.5 g/dL   Albumin/Globulin Ratio 1.9 1.2 - 2.2   Bilirubin Total 0.3 0.0 - 1.2 mg/dL   Alkaline Phosphatase 109 39 - 117 IU/L   AST 16 0 - 40 IU/L   ALT 7 0 - 32 IU/L  Lipid Panel With LDL/HDL Ratio     Status: None   Collection Time: 01/12/18 10:14 AM  Result Value Ref Range   Cholesterol, Total 167 100 - 199 mg/dL   Triglycerides 145 0 - 149 mg/dL   HDL 51 >39 mg/dL   VLDL Cholesterol Cal 29 5 - 40 mg/dL   LDL Calculated 87 0 - 99 mg/dL   LDl/HDL Ratio  1.7 0.0 - 3.2 ratio    Comment:                                     LDL/HDL Ratio                                             Men  Women  1/2 Avg.Risk  1.0    1.5                                   Avg.Risk  3.6    3.2                                2X Avg.Risk  6.2    5.0                                3X Avg.Risk  8.0    6.1     Assessment/Plan:  Essential (primary) hypertension blood pressure control important in reducing the progression of atherosclerotic disease. On appropriate oral medications.   Diabetes ([place]) blood glucose control important in reducing the progression of atherosclerotic disease. Also, involved in wound healing. On appropriate medications.   Mixed hyperlipidemia lipid control important in reducing the progression of atherosclerotic disease. Continue statin therapy   Pain in limb The patient describes lower extremity symptoms that are not entirely clear in their etiology, but do sound worrisome for peripheral arterial disease with claudication.  She has a litany of atherosclerotic risk factors.  Noninvasive studies will be performed in the near future at her convenience to assess her for PAD.  We had a long discussion regarding the natural history of PAD and the treatment options.  She is on appropriate medical therapy already.  I will see her back following her study.      Leotis Pain 02/28/2018, 12:05 PM   This note was created with Dragon medical transcription system.  Any errors from dictation are unintentional.

## 2018-03-02 ENCOUNTER — Encounter: Payer: Medicare Other | Admitting: Family Medicine

## 2018-03-02 ENCOUNTER — Ambulatory Visit: Payer: Medicare Other

## 2018-03-07 ENCOUNTER — Other Ambulatory Visit: Payer: Self-pay | Admitting: Family Medicine

## 2018-03-10 ENCOUNTER — Encounter

## 2018-03-10 ENCOUNTER — Encounter (INDEPENDENT_AMBULATORY_CARE_PROVIDER_SITE_OTHER): Payer: Self-pay | Admitting: Nurse Practitioner

## 2018-03-10 ENCOUNTER — Ambulatory Visit (INDEPENDENT_AMBULATORY_CARE_PROVIDER_SITE_OTHER): Payer: Medicare Other | Admitting: Nurse Practitioner

## 2018-03-10 ENCOUNTER — Ambulatory Visit (INDEPENDENT_AMBULATORY_CARE_PROVIDER_SITE_OTHER): Payer: Medicare Other

## 2018-03-10 VITALS — BP 194/98 | HR 78 | Resp 16 | Ht 66.0 in | Wt 175.0 lb

## 2018-03-10 DIAGNOSIS — M79605 Pain in left leg: Secondary | ICD-10-CM

## 2018-03-10 DIAGNOSIS — J449 Chronic obstructive pulmonary disease, unspecified: Secondary | ICD-10-CM | POA: Diagnosis not present

## 2018-03-10 DIAGNOSIS — E782 Mixed hyperlipidemia: Secondary | ICD-10-CM

## 2018-03-10 DIAGNOSIS — M79604 Pain in right leg: Secondary | ICD-10-CM | POA: Diagnosis not present

## 2018-03-10 DIAGNOSIS — Z87891 Personal history of nicotine dependence: Secondary | ICD-10-CM

## 2018-03-10 NOTE — Progress Notes (Signed)
Subjective:    Patient ID: Candace [name], female    DOB: 05-29-1950, 67 y.o.   MRN: [id number] Chief Complaint  Patient presents with  . Follow-up    ABI u/s follow up    HPI  Candace [name] is a 67 y.o. female that is following up for evaluation of leg pain.  Her bilateral leg pain has become more consistent within the last year or so.  She states that the right leg is worse than the left.  She states that the pain happens when she is ambulating as well as when she is lying in bed.  She states that she feels as if she cannot get comfortable and she feels the need to constantly move her legs.  She does notice cramping in her calf area with ambulation of short distances, she also notes an area behind her knee which is also somewhat painful.  She denies any chest pain or shortness of breath.  She denies any fever, chills, nausea, vomiting or diarrhea.  Patient has no prior history of peripheral vascular disease nor has she had any other peripheral interventions.  The patient endorses a history of low back pain as well as scoliosis.  The patient underwent bilateral ABIs today which revealed an ABI 1.01 on her right lower extremity and 1.05 on her left.  She had a TBI of 0.64 on her right lower extremity and a TBI of 0.59 on her left.  Her right lower extremity had biphasic waveforms in the right tibial arteries with normal toe waveforms.  The left lower extremity had biphasic tibial waveforms with normal toe waveforms.  Past Medical History:  Diagnosis Date  . Allergy   . Anemia   . Anxiety   . Arrhythmia   . Arthritis   . Coronary artery disease   . Depression   . Diabetes mellitus without complication ([place])   . Dyspnea    doe  . Dysrhythmia   . GERD (gastroesophageal reflux disease)   . Headache   . History of hiatal hernia   . Hyperlipidemia   . Hypertension   . Myocardial infarction ([place])    2016, 04/2017  . Myocardial infarction with cardiac rehabilitation [facility])    MI  2016/ CABG 8/17    FINISHED CARDIAC REHAB 3 WEEKS AGO  . Panic attack   . Pneumonia   . Reflux   . Stroke [facility]) 2015   showed up on MRI; no weakness noted  . TIA (transient ischemic attack)   . Voice tremor     Past Surgical History:  Procedure Laterality Date  . ABDOMINAL HYSTERECTOMY    . APPENDECTOMY  1975  . ARTERY BIOPSY Right 04/26/2016   Procedure: BIOPSY TEMPORAL ARTERY;  Surgeon: Margaretha Sheffield, MD;  Location: [facility];  Service: ENT;  Laterality: Right;  . CARDIAC CATHETERIZATION N/A 11/06/2015   Procedure: Left Heart Cath and Coronary Angiography;  Surgeon: Corey Skains, MD;  Location: [place] [facility];  Service: Cardiovascular;  Laterality: N/A;  . CESAREAN SECTION    . COLONOSCOPY  2015  . CORONARY ANGIOPLASTY  04/2017   [place]  . CORONARY ARTERY BYPASS GRAFT N/A 11/10/2015   Procedure: CORONARY ARTERY BYPASS GRAFTING (CABG), ON PUMP, TIMES FOUR, USING LEFT INTERNAL MAMMARY ARTERY, BILATERAL GREATER SAPHENOUS VEINS HARVESTED ENDOSCOPICALLY;  Surgeon: [name], MD;  Location: [place];  Service: Open Heart Surgery;  Laterality: N/A;  LIMA-LAD; SEQ SVG-OM1-OM2; SVG-PL  . CORONARY STENT INTERVENTION N/A 08/05/2016  Procedure: Coronary Stent Intervention;  Surgeon: Isaias Cowman, MD;  Location: [place] [facility];  Service: Cardiovascular;  Laterality: N/A;  . HYSTERECTOMY ABDOMINAL WITH SALPINGO-OOPHORECTOMY Bilateral 08/15/2017   Procedure: HYSTERECTOMY ABDOMINAL WITH BILATERAL SALPINGO-OOPHORECTOMY;  Surgeon: Rubie Maid, MD;  Location: [facility];  Service: Gynecology;  Laterality: Bilateral;  . LEFT HEART CATH AND CORONARY ANGIOGRAPHY N/A 08/05/2016   Procedure: Left Heart Cath and Coronary Angiography;  Surgeon: Isaias Cowman, MD;  Location: [place] [facility];  Service: Cardiovascular;  Laterality: N/A;  . TEE WITHOUT CARDIOVERSION N/A 11/10/2015   Procedure: TRANSESOPHAGEAL ECHOCARDIOGRAM (TEE);  Surgeon: [name], MD;   Location: [place];  Service: Open Heart Surgery;  Laterality: N/A;  . TUBAL LIGATION      Social History   Socioeconomic History  . Marital status: Divorced    Spouse name: Not on file  . Number of children: Not on file  . Years of education: Not on file  . Highest education level: Not on file  Occupational History  . Not on file  Social Needs  . Financial resource strain: Not on file  . Food insecurity:    Worry: Not on file    Inability: Not on file  . Transportation needs:    Medical: Not on file    Non-medical: Not on file  Tobacco Use  . Smoking status: Former Smoker    Packs/day: 0.50    Types: Cigarettes    Last attempt to quit: 10/07/2001    Years since quitting: 16.4  . Smokeless tobacco: Never Used  Substance and Sexual Activity  . Alcohol use: No    Alcohol/week: 0.0 standard drinks  . Drug use: No  . Sexual activity: Not Currently  Lifestyle  . Physical activity:    Days per week: Not on file    Minutes per session: Not on file  . Stress: Not on file  Relationships  . Social connections:    Talks on phone: Not on file    Gets together: Not on file    Attends religious service: Not on file    Active member of club or organization: Not on file    Attends meetings of clubs or organizations: Not on file    Relationship status: Not on file  . Intimate partner violence:    Fear of current or ex partner: Not on file    Emotionally abused: Not on file    Physically abused: Not on file    Forced sexual activity: Not on file  Other Topics Concern  . Not on file  Social History Narrative  . Not on file    Family History  Problem Relation Age of Onset  . Cancer Father   . Hypertension Father   . Heart disease Father   . Asthma Father   . Cancer Mother   . Hypertension Mother   . Pancreatic cancer Mother 59  . Cancer Sister   . Breast cancer Sister 22  . Breast cancer Sister 4  . Lung cancer Brother   . Pancreatic cancer Sister 45  . Cancer Sister      Allergies  Allergen Reactions  . Lisinopril     cough  . Penicillins Swelling, Rash and Other (See Comments)    Has patient had a PCN reaction causing immediate rash, facial/tongue/throat swelling, SOB or lightheadedness with hypotension: Yes Has patient had a PCN reaction causing severe rash involving mucus membranes or skin necrosis: Yes Has patient had a PCN reaction that required  hospitalization No Has patient had a PCN reaction occurring within the last 10 years: Yes If all of the above answers are "NO", then may proceed with Cephalosporin use.      Review of Systems   Review of Systems: Negative Unless Checked Constitutional: [] Weight loss  [] Fever  [] Chills Cardiac: [] Chest pain   []  Atrial Fibrillation  [] Palpitations   [] Shortness of breath when laying flat   [] Shortness of breath with exertion. Vascular:  [x] Pain in legs with walking   [x] Pain in legs with standing  [] History of DVT   [] Phlebitis   [] Swelling in legs   [] Varicose veins   [] Non-healing ulcers Pulmonary:   [] Uses home oxygen   [] Productive cough   [] Hemoptysis   [] Wheeze  [] COPD   [] Asthma Neurologic:  [] Dizziness   [] Seizures   [x] History of stroke   [x] History of TIA  [] Aphasia   [] Vissual changes   [] Weakness or numbness in arm   [] Weakness or numbness in leg Musculoskeletal:   [x] Joint swelling   [] Joint pain   [x] Low back pain  []  History of Knee Replacement Hematologic:  [] Easy bruising  [] Easy bleeding   [] Hypercoagulable state   [] Anemic Gastrointestinal:  [] Diarrhea   [] Vomiting  [x] Gastroesophageal reflux/heartburn   [] Difficulty swallowing. Genitourinary:  [] Chronic kidney disease   [] Difficult urination  [] Anuric   [] Blood in urine Skin:  [] Rashes   [] Ulcers  Psychological:  [x] History of anxiety   []  History of major depression  []  Memory Difficulties     Objective:   Physical Exam  BP (!) 194/98 (BP Location: Right Arm, Patient Position: Sitting)   Pulse 78   Resp 16   Ht 5\' 6"  (1.676  m)   Wt 175 lb (79.4 kg)   BMI 28.25 kg/m   Gen: WD/WN, NAD Head: [place]/AT, No temporalis wasting.  Ear/Nose/Throat: Hearing grossly intact, nares w/o erythema or drainage Eyes: PER, EOMI, sclera nonicteric.  Neck: Supple, no masses.  No JVD.  Pulmonary:  Good air movement, no use of accessory muscles.  Cardiac: RRR Vascular:  Vessel Right Left  Radial Palpable Palpable  Dorsalis Pedis Palpable Palpable  Posterior Tibial Palpable Palpable   Gastrointestinal: soft, non-distended. No guarding/no peritoneal signs.  Musculoskeletal: M/S 5/5 throughout.  No deformity or atrophy.  Neurologic: Pain and light touch intact in extremities.  Symmetrical.  Speech is fluent. Motor exam as listed above.  Bilateral tremors Psychiatric: Judgment intact, Mood & affect appropriate for pt's clinical situation. Dermatologic: No Venous rashes. No Ulcers Noted.  No changes consistent with cellulitis. Lymph : No Cervical lymphadenopathy, no lichenification or skin changes of chronic lymphedema.      Assessment & Plan:   1. Pain in both lower extremities Based upon the patient's ABI results it does not suggest that there is any vascular component to the pain that she is having.  Although her waveforms are biphasic she has palpable pulses and normal ABIs.  I have suggested to the patient that she return to her primary care physician for further work-up of her lower extremity pain I have suggested the possibility that it is a form of neurogenic claudication, given her history of degenerative disc disease as well as scoliosis this is a definite possibility.  I have also informed the patient that should work-up of other possible causes of her leg pain and discomfort come back negative we could possibly try to repeat her ABIs using toes lifts or even a possible arterial duplex instead.  The patient understood and agree with this  plan.  The patient will follow-up with [place] as needed.  2. Mixed  hyperlipidemia Continue statin as ordered and reviewed, no changes at this time   3. COPD suggested by initial evaluation [facility]) Continue pulmonary medications and aerosols as already ordered, these medications have been reviewed and there are no changes at this time.     Current Outpatient Medications on File Prior to Visit  Medication Sig Dispense Refill  . acetaminophen (TYLENOL) 500 MG tablet Take 2 tablets (1,000 mg total) by mouth every 8 (eight) hours as needed. 60 tablet 1  . clopidogrel (PLAVIX) 75 MG tablet Take 75 mg by mouth daily.    . Coenzyme Q10 (CO Q 10) 100 MG CAPS Take 100 mg by mouth daily. 30 capsule 12  . [facility] ASPIRIN ADULT LOW DOSE 81 MG chewable tablet CHEW 1 TABLET (81 MG TOTAL) BY MOUTH DAILY. 90 tablet 3  . cyproheptadine (PERIACTIN) 4 MG tablet Take 4 mg by mouth 3 (three) times daily as needed for allergies.    . furosemide (LASIX) 20 MG tablet Take 20 mg by mouth daily.    Marland Kitchen HYDROcodone-acetaminophen (NORCO/VICODIN) 5-325 MG tablet Take one tablet at night for pain; may take up to every 6 hours as needed for pain if not working or driving    . isosorbide mononitrate (IMDUR) 30 MG 24 hr tablet Take 2 tablets (60 mg total) by mouth daily. 60 tablet 12  . loperamide (IMODIUM) 2 MG capsule Take by mouth as needed for diarrhea or loose stools.    . metFORMIN (GLUCOPHAGE) 1000 MG tablet Take 1 tablet (1,000 mg total) by mouth 2 (two) times daily with a meal. 180 tablet 3  . metoprolol tartrate (LOPRESSOR) 50 MG tablet TAKE 1 TABLET BY MOUTH TWICE A DAY FOR HIGH BLOOD PRESSURE 60 tablet 12  . nitroGLYCERIN (NITROSTAT) 0.4 MG SL tablet Place 1 tablet (0.4 mg total) under the tongue every 5 (five) minutes as needed for chest pain. 30 tablet 0  . omeprazole (PRILOSEC) 20 MG capsule TAKE 1 CAPSULE BY MOUTH EVERY DAY (Patient not taking: Reported on 02/28/2018) 90 capsule 1  . ondansetron (ZOFRAN-ODT) 8 MG disintegrating tablet DISSOLVE 1 TABLET ON THE TONGUE EVERY 8 HOURS AS  NEEDED FOR NAUSEA OR VOMITING 30 tablet 1  . rosuvastatin (CRESTOR) 10 MG tablet TAKE 1 TABLET BY MOUTH EVERY DAY 90 tablet 3  . sucralfate (CARAFATE) 1 g tablet Take 1 tablet (1 g total) by mouth 2 (two) times daily. 60 tablet 5  . tiZANidine (ZANAFLEX) 2 MG tablet Take 2 mg by mouth at bedtime.      No current facility-administered medications on file prior to visit.     There are no Patient Instructions on file for this visit. No follow-ups on file.   Kris Hartmann, NP  This note was completed with Sales executive.  Any errors are purely unintentional.

## 2018-03-16 ENCOUNTER — Ambulatory Visit (INDEPENDENT_AMBULATORY_CARE_PROVIDER_SITE_OTHER): Payer: Medicare Other | Admitting: Family Medicine

## 2018-03-16 ENCOUNTER — Ambulatory Visit (INDEPENDENT_AMBULATORY_CARE_PROVIDER_SITE_OTHER): Payer: Medicaid Other | Admitting: Nurse Practitioner

## 2018-03-16 ENCOUNTER — Encounter (INDEPENDENT_AMBULATORY_CARE_PROVIDER_SITE_OTHER): Payer: Medicaid Other

## 2018-03-16 ENCOUNTER — Ambulatory Visit (INDEPENDENT_AMBULATORY_CARE_PROVIDER_SITE_OTHER): Payer: Medicare Other

## 2018-03-16 VITALS — BP 132/72 | HR 81 | Temp 98.5°F | Ht 66.0 in | Wt 175.4 lb

## 2018-03-16 VITALS — BP 132/72 | HR 81 | Temp 98.5°F | Resp 16 | Ht 66.0 in | Wt 175.0 lb

## 2018-03-16 DIAGNOSIS — Z0000 Encounter for general adult medical examination without abnormal findings: Secondary | ICD-10-CM | POA: Diagnosis not present

## 2018-03-16 DIAGNOSIS — Z13820 Encounter for screening for osteoporosis: Secondary | ICD-10-CM | POA: Diagnosis not present

## 2018-03-16 DIAGNOSIS — Z1159 Encounter for screening for other viral diseases: Secondary | ICD-10-CM

## 2018-03-16 DIAGNOSIS — Z23 Encounter for immunization: Secondary | ICD-10-CM

## 2018-03-16 NOTE — Progress Notes (Signed)
Subjective:   Candace [name] is a 67 y.o. female who presents for an Initial Medicare Annual Wellness Visit.  Review of Systems    N/A   Cardiac Risk Factors include: advanced age (>73men, >50 women);diabetes mellitus;dyslipidemia;hypertension     Objective:    Today's Vitals   03/16/18 0952 03/16/18 0956  BP: 132/72   Pulse: 81   Temp: 98.5 F (36.9 C)   TempSrc: Oral   Weight: 175 lb 6.4 oz (79.6 kg)   Height: 5\' 6"  (1.676 m)   PainSc: 0-No pain 0-No pain   Body mass index is 28.31 kg/m.  Advanced Directives 03/16/2018 11/24/2017 08/15/2017 08/10/2017 07/26/2017 06/02/2017 12/23/2016  Does Patient Have a Medical Advance Directive? No Yes No No No No No  Type of Advance Directive - Living will - - - - -  Does patient want to make changes to medical advance directive? - No - Patient declined - - - - -  Copy of [place] in Chart? - - - - - - -  Would patient like information on creating a medical advance directive? No - Patient declined - No - Patient declined No - Patient declined No - Patient declined Yes (MAU/Ambulatory/Procedural Areas - Information given) Yes (ED - Information included in AVS)    Current Medications (verified) Outpatient Encounter Medications as of 03/16/2018  Medication Sig  . acetaminophen (TYLENOL) 500 MG tablet Take 2 tablets (1,000 mg total) by mouth every 8 (eight) hours as needed.  . clopidogrel (PLAVIX) 75 MG tablet Take 75 mg by mouth daily.  . Coenzyme Q10 (CO Q 10) 100 MG CAPS Take 100 mg by mouth daily.  . [facility] ASPIRIN ADULT LOW DOSE 81 MG chewable tablet CHEW 1 TABLET (81 MG TOTAL) BY MOUTH DAILY.  . cyproheptadine (PERIACTIN) 4 MG tablet Take 4 mg by mouth 3 (three) times daily as needed for allergies.  . furosemide (LASIX) 20 MG tablet Take 20 mg by mouth daily.  Marland Kitchen HYDROcodone-acetaminophen (NORCO/VICODIN) 5-325 MG tablet Take one tablet at night for pain; may take up to every 6 hours as needed for pain if not working or  driving  . isosorbide mononitrate (IMDUR) 30 MG 24 hr tablet Take 2 tablets (60 mg total) by mouth daily.  Marland Kitchen loperamide (IMODIUM) 2 MG capsule Take by mouth as needed for diarrhea or loose stools.  . metFORMIN (GLUCOPHAGE) 1000 MG tablet Take 1 tablet (1,000 mg total) by mouth 2 (two) times daily with a meal.  . metoprolol tartrate (LOPRESSOR) 50 MG tablet TAKE 1 TABLET BY MOUTH TWICE A DAY FOR HIGH BLOOD PRESSURE  . nitroGLYCERIN (NITROSTAT) 0.4 MG SL tablet Place 1 tablet (0.4 mg total) under the tongue every 5 (five) minutes as needed for chest pain.  Marland Kitchen omeprazole (PRILOSEC) 20 MG capsule TAKE 1 CAPSULE BY MOUTH EVERY DAY  . ondansetron (ZOFRAN-ODT) 8 MG disintegrating tablet DISSOLVE 1 TABLET ON THE TONGUE EVERY 8 HOURS AS NEEDED FOR NAUSEA OR VOMITING  . rosuvastatin (CRESTOR) 10 MG tablet TAKE 1 TABLET BY MOUTH EVERY DAY  . sucralfate (CARAFATE) 1 g tablet Take 1 tablet (1 g total) by mouth 2 (two) times daily.  Marland Kitchen tiZANidine (ZANAFLEX) 2 MG tablet Take 2 mg by mouth at bedtime.    No facility-administered encounter medications on file as of 03/16/2018.     Allergies (verified) Lisinopril and Penicillins   History: Past Medical History:  Diagnosis Date  . Allergy   . Anemia   . Anxiety   .  Arrhythmia   . Arthritis   . Coronary artery disease   . Depression   . Diabetes mellitus without complication ([place])   . Dyspnea    doe  . Dysrhythmia   . GERD (gastroesophageal reflux disease)   . Headache   . History of hiatal hernia   . Hyperlipidemia   . Hypertension   . Myocardial infarction ([place])    2016, 04/2017  . Myocardial infarction with cardiac rehabilitation [facility])    MI 2016/ CABG 8/17    FINISHED CARDIAC REHAB 3 WEEKS AGO  . Panic attack   . Pneumonia   . Reflux   . Stroke [facility]) 2015   showed up on MRI; no weakness noted  . TIA (transient ischemic attack)   . Voice tremor    Past Surgical History:  Procedure Laterality Date  . ABDOMINAL HYSTERECTOMY    .  APPENDECTOMY  1975  . ARTERY BIOPSY Right 04/26/2016   Procedure: BIOPSY TEMPORAL ARTERY;  Surgeon: Margaretha Sheffield, MD;  Location: [facility] ORS;  Service: ENT;  Laterality: Right;  . CARDIAC CATHETERIZATION N/A 11/06/2015   Procedure: Left Heart Cath and Coronary Angiography;  Surgeon: Corey Skains, MD;  Location: [place] [facility] LAB;  Service: Cardiovascular;  Laterality: N/A;  . CESAREAN SECTION    . COLONOSCOPY  2015  . CORONARY ANGIOPLASTY  04/2017   [place]  . CORONARY ARTERY BYPASS GRAFT N/A 11/10/2015   Procedure: CORONARY ARTERY BYPASS GRAFTING (CABG), ON PUMP, TIMES FOUR, USING LEFT INTERNAL MAMMARY ARTERY, BILATERAL GREATER SAPHENOUS VEINS HARVESTED ENDOSCOPICALLY;  Surgeon: [name], MD;  Location: [place];  Service: Open Heart Surgery;  Laterality: N/A;  LIMA-LAD; SEQ SVG-OM1-OM2; SVG-PL  . CORONARY STENT INTERVENTION N/A 08/05/2016   Procedure: Coronary Stent Intervention;  Surgeon: Isaias Cowman, MD;  Location: [place] [facility] LAB;  Service: Cardiovascular;  Laterality: N/A;  . HYSTERECTOMY ABDOMINAL WITH SALPINGO-OOPHORECTOMY Bilateral 08/15/2017   Procedure: HYSTERECTOMY ABDOMINAL WITH BILATERAL SALPINGO-OOPHORECTOMY;  Surgeon: Rubie Maid, MD;  Location: [facility] ORS;  Service: Gynecology;  Laterality: Bilateral;  . LEFT HEART CATH AND CORONARY ANGIOGRAPHY N/A 08/05/2016   Procedure: Left Heart Cath and Coronary Angiography;  Surgeon: Isaias Cowman, MD;  Location: [place] [facility];  Service: Cardiovascular;  Laterality: N/A;  . TEE WITHOUT CARDIOVERSION N/A 11/10/2015   Procedure: TRANSESOPHAGEAL ECHOCARDIOGRAM (TEE);  Surgeon: [name], MD;  Location: [place];  Service: Open Heart Surgery;  Laterality: N/A;  . TUBAL LIGATION     Family History  Problem Relation Age of Onset  . Cancer Father   . Hypertension Father   . Heart disease Father   . Asthma Father   . Cancer Mother   . Hypertension Mother   . Pancreatic cancer Mother 66  .  Cancer Sister   . Breast cancer Sister 48  . Breast cancer Sister 90  . Lung cancer Brother   . Pancreatic cancer Sister 78  . Cancer Sister    Social History   Socioeconomic History  . Marital status: Divorced    Spouse name: Not on file  . Number of children: 3  . Years of education: Not on file  . Highest education level: Some college, no degree  Occupational History  . Occupation: retired  Scientific laboratory technician  . Financial resource strain: Not hard at all  . Food insecurity:    Worry: Never true    Inability: Never true  . Transportation needs:    Medical: No    Non-medical: No  Tobacco Use  . Smoking status: Former Smoker    Packs/day: 0.50    Types: Cigarettes    Last attempt to quit: 10/07/2001    Years since quitting: 16.4  . Smokeless tobacco: Never Used  Substance and Sexual Activity  . Alcohol use: No    Alcohol/week: 0.0 standard drinks  . Drug use: No  . Sexual activity: Not Currently  Lifestyle  . Physical activity:    Days per week: 0 days    Minutes per session: 0 min  . Stress: Not at all  Relationships  . Social connections:    Talks on phone: Patient refused    Gets together: Patient refused    Attends religious service: Patient refused    Active member of club or organization: Patient refused    Attends meetings of clubs or organizations: Patient refused    Relationship status: Patient refused  Other Topics Concern  . Not on file  Social History Narrative  . Not on file    Tobacco Counseling Counseling given: Not Answered   Clinical Intake:  Pre-visit preparation completed: Yes  Pain : No/denies pain Pain Score: 0-No pain    Diabetes:  Is the patient diabetic?  Yes type 2 If diabetic, was a CBG obtained today?  No  Did the patient bring in their glucometer from home?  No  How often do you monitor your CBG's? Once daily.   Financial Strains and Diabetes Management:  Are you having any financial strains with the device, your  supplies or your medication? No .  Does the patient want to be seen by Chronic Care Management for management of their diabetes?  No  Would the patient like to be referred to a Nutritionist or for Diabetic Management?  No   Diabetic Exams:  Diabetic Eye Exam: Completed 06/17/16. Overdue for diabetic eye exam. Pt has been advised about the importance in completing this exam yearly. Pt to set up an eye exam in 2020.   Diabetic Foot Exam: Completed 01/31/18.   Nutritional Status: BMI 25 -29 Overweight Nutritional Risks: None   How often do you need to have someone help you when you read instructions, pamphlets, or other written materials from your doctor or pharmacy?: 1 - Never  Interpreter Needed?: No  Information entered by :: [facility], LPN   Activities of Daily Living In your present state of health, do you have any difficulty performing the following activities: 03/16/2018 08/15/2017  Hearing? Y N  Comment Does not wear hearing aids.  -  Vision? N N  Comment Wears readers.  -  Difficulty concentrating or making decisions? N N  Walking or climbing stairs? N N  Comment - -  Dressing or bathing? N N  Doing errands, shopping? N Y  Comment - -  Conservation officer, nature and eating ? N -  Using the Toilet? N -  In the past six months, have you accidently leaked urine? N -  Do you have problems with loss of bowel control? N -  Managing your Medications? N -  Managing your Finances? N -  Housekeeping or managing your Housekeeping? N -  Some recent data might be hidden     Immunizations and Health Maintenance Immunization History  Administered Date(s) Administered  . Influenza, High Dose Seasonal PF 12/24/2016, 12/08/2017  . Influenza,inj,Quad PF,6+ Mos 12/19/2015  . Influenza-Unspecified 03/06/2015  . Pneumococcal Conjugate-13 03/16/2018  . Pneumococcal Polysaccharide-23 12/24/2016  . Tdap 07/28/2010   Health Maintenance Due  Topic Date Due  .  Hepatitis C Screening  [birth year]    . URINE MICROALBUMIN  06/08/1961  . DEXA SCAN  06/08/2016  . OPHTHALMOLOGY EXAM  06/17/2017    Patient Care Team: [name]., MD as PCP - General (Family Medicine) Corey Skains, MD as Consulting Physician (Internal Medicine) Kris Hartmann, NP as Nurse Practitioner (Vascular Surgery) Sharlotte Alamo, DPM (Podiatry)  Indicate any recent Medical Services you may have received from other than [facility] providers in the past year (date may be approximate).     Assessment:   This is a routine wellness examination for Candace [name].  Hearing/Vision screen No exam data present  Dietary issues and exercise activities discussed: Current Exercise Habits: The patient does not participate in regular exercise at present, Exercise limited by: None identified  Goals    . DIET - INCREASE WATER INTAKE     Recommend to drink at least 6-8 8oz glasses of water per day.       Depression Screen PHQ 2/9 Scores 03/16/2018 01/26/2018 12/13/2017 11/24/2017 08/04/2017 06/02/2017 04/25/2017  PHQ - 2 Score 2 1 0 0 1 3 0  PHQ- 9 Score 4 5 3 4 7 14 10     Fall Risk Fall Risk  03/16/2018 11/24/2017 06/02/2017 06/22/2016 04/19/2016  Falls in the past year? 0 No Yes Yes No  Number falls in past yr: 0 - 2 or more 2 or more -  Injury with Fall? 0 - No No -  Risk Factor Category  - - - High Fall Risk -  Risk for fall due to : - Impaired balance/gait;Impaired mobility History of fall(s) - -  Follow up - - Education provided;Falls prevention discussed Falls evaluation completed -    FALL RISK PREVENTION PERTAINING TO THE HOME:  Any stairs in or around the home WITH handrails? No  Home free of loose throw rugs in walkways, pet beds, electrical cords, etc? Yes  Adequate lighting in your home to reduce risk of falls? Yes   ASSISTIVE DEVICES UTILIZED TO PREVENT FALLS:  Life alert? No  Use of a cane, walker or w/c? No  Grab bars in the bathroom? No  Shower chair or bench in shower? No  Elevated toilet seat or a  handicapped toilet? No    TIMED UP AND GO:  Was the test performed? Yes .    Cognitive Function: MMSE - Mini Mental State Exam 04/19/2016  Orientation to time 5  Orientation to Place 5  Registration 3  Attention/ Calculation 4  Recall 3  Language- name 2 objects 2  Language- repeat 1  Language- follow 3 step command 3  Language- read & follow direction 1  Write a sentence 1  Copy design 1  Total score 29     6CIT Screen 03/16/2018  What Year? 0 points  What month? 0 points  What time? 0 points  Count back from 20 0 points  Months in reverse 0 points  Repeat phrase 0 points  Total Score 0    Screening Tests Health Maintenance  Topic Date Due  . Hepatitis C Screening  24-Jan-1951  . URINE MICROALBUMIN  06/08/1961  . DEXA SCAN  06/08/2016  . OPHTHALMOLOGY EXAM  06/17/2017  . HEMOGLOBIN A1C  07/14/2018  . COLONOSCOPY  10/24/2018  . FOOT EXAM  02/01/2019  . MAMMOGRAM  12/31/2019  . TETANUS/TDAP  07/27/2020  . INFLUENZA VACCINE  Completed  . PNA vac Low Risk Adult  Completed    Qualifies for Shingles Vaccine? Yes, due for  Shingrix. Education has been provided regarding the importance of this vaccine. Pt has been advised to call insurance company to determine out of pocket expense. Advised may also receive vaccine at local pharmacy or Health Dept. Verbalized acceptance and understanding.  Tdap: Up to date  Flu Vaccine: Up to date  Pneumococcal Vaccine: Up to date   Cancer Screenings:  Colorectal Screening: Completed 10/23/13. Repeat every 5 years.  Mammogram: Completed 01/09/18.   Bone Density: Ordered today. Pt aware the office will call re: appt.  Lung Cancer Screening: (Low Dose CT Chest recommended if Age 68-80 years, 30 pack-year currently smoking OR have quit w/in 15years.) does not qualify.    Additional Screening:  Hepatitis C Screening: does qualify; ordered today.  Vision Screening: Recommended annual ophthalmology exams for early detection  of glaucoma and other disorders of the eye.  Dental Screening: Recommended annual dental exams for proper oral hygiene  Community Resource Referral:  CRR required this visit?  No       Plan:  I have personally reviewed and addressed the Medicare Annual Wellness questionnaire and have noted the following in the patient's chart:  A. Medical and social history B. Use of alcohol, tobacco or illicit drugs  C. Current medications and supplements D. Functional ability and status E.  Nutritional status F.  Physical activity G. Advance directives H. List of other physicians I.  Hospitalizations, surgeries, and ER visits in previous 12 months J.  [place] such as hearing and vision if needed, cognitive and depression L. Referrals and appointments - none  In addition, I have reviewed and discussed with patient certain preventive protocols, quality metrics, and best practice recommendations. A written personalized care plan for preventive services as well as general preventive health recommendations were provided to patient.  See attached scanned questionnaire for additional information.   Signed,  Fabio Neighbors, LPN Nurse Health Advisor   Nurse Recommendations: Pt needs a urine check today. Pt plans to set up an eye exam in 2020. DEXA referral sent and Hep C lab ordered today.

## 2018-03-16 NOTE — Patient Instructions (Signed)
Candace [name] , Thank you for taking time to come for your Medicare Wellness Visit. I appreciate your ongoing commitment to your health goals. Please review the following plan we discussed and let me know if I can assist you in the future.   Screening recommendations/referrals: Colonoscopy: Up to date, due 09/2018 Mammogram: Up to date, due 12/2019 Bone Density: Ordered today. Office to contact pt to set up apt.  Recommended yearly ophthalmology/optometry visit for glaucoma screening and checkup Recommended yearly dental visit for hygiene and checkup  Vaccinations: Influenza vaccine: Up to date Pneumococcal vaccine: Completed series today.  Tdap vaccine: Up to date, due 07/2020 Shingles vaccine: Pt declines today.     Advanced directives: Advance directive discussed with you today. Even though you declined this today please call our office should you change your mind.  Conditions/risks identified: Recommend to drink at least 6-8 8oz glasses of water per day.  Next appointment: 10:40 AM today with Dr Rosanna Randy   Preventive Care 66 Years and Older, Female Preventive care refers to lifestyle choices and visits with your health care provider that can promote health and wellness. What does preventive care include?  A yearly physical exam. This is also called an annual well check.  Dental exams once or twice a year.  Routine eye exams. Ask your health care provider how often you should have your eyes checked.  Personal lifestyle choices, including:  Daily care of your teeth and gums.  Regular physical activity.  Eating a healthy diet.  Avoiding tobacco and drug use.  Limiting alcohol use.  Practicing safe sex.  Taking low-dose aspirin every day.  Taking vitamin and mineral supplements as recommended by your health care provider. What happens during an annual well check? The services and screenings done by your health care provider during your annual well check will depend  on your age, overall health, lifestyle risk factors, and family history of disease. Counseling  Your health care provider may ask you questions about your:  Alcohol use.  Tobacco use.  Drug use.  Emotional well-being.  Home and relationship well-being.  Sexual activity.  Eating habits.  History of falls.  Memory and ability to understand (cognition).  Work and work Statistician.  Reproductive health. Screening  You may have the following tests or measurements:  Height, weight, and BMI.  Blood pressure.  Lipid and cholesterol levels. These may be checked every 5 years, or more frequently if you are over 66 years old.  Skin check.  Lung cancer screening. You may have this screening every year starting at age 66 if you have a 30-pack-year history of smoking and currently smoke or have quit within the past 15 years.  Fecal occult blood test (FOBT) of the stool. You may have this test every year starting at age 66.  Flexible sigmoidoscopy or colonoscopy. You may have a sigmoidoscopy every 5 years or a colonoscopy every 10 years starting at age 66.  Hepatitis C blood test.  Hepatitis B blood test.  Sexually transmitted disease (STD) testing.  Diabetes screening. This is done by checking your blood sugar (glucose) after you have not eaten for a while (fasting). You may have this done every 1-3 years.  Bone density scan. This is done to screen for osteoporosis. You may have this done starting at age 66.  Mammogram. This may be done every 1-2 years. Talk to your health care provider about how often you should have regular mammograms. Talk with your health care provider about your test  results, treatment options, and if necessary, the need for more tests. Vaccines  Your health care provider may recommend certain vaccines, such as:  Influenza vaccine. This is recommended every year.  Tetanus, diphtheria, and acellular pertussis (Tdap, Td) vaccine. You may need a Td  booster every 10 years.  Zoster vaccine. You may need this after age 66.  Pneumococcal 13-valent conjugate (PCV13) vaccine. One dose is recommended after age 66.  Pneumococcal polysaccharide (PPSV23) vaccine. One dose is recommended after age 66. Talk to your health care provider about which screenings and vaccines you need and how often you need them. This information is not intended to replace advice given to you by your health care provider. Make sure you discuss any questions you have with your health care provider. Document Released: 04/11/2015 Document Revised: 12/03/2015 Document Reviewed: 01/14/2015 Elsevier Interactive Patient Education  2017 [place] Prevention in the Home Falls can cause injuries. They can happen to people of all ages. There are many things you can do to make your home safe and to help prevent falls. What can I do on the outside of my home?  Regularly fix the edges of walkways and driveways and fix any cracks.  Remove anything that might make you trip as you walk through a door, such as a raised step or threshold.  Trim any bushes or trees on the path to your home.  Use bright outdoor lighting.  Clear any walking paths of anything that might make someone trip, such as rocks or tools.  Regularly check to see if handrails are loose or broken. Make sure that both sides of any steps have handrails.  Any raised decks and porches should have guardrails on the edges.  Have any leaves, snow, or ice cleared regularly.  Use sand or salt on walking paths during winter.  Clean up any spills in your garage right away. This includes oil or grease spills. What can I do in the bathroom?  Use night lights.  Install grab bars by the toilet and in the tub and shower. Do not use towel bars as grab bars.  Use non-skid mats or decals in the tub or shower.  If you need to sit down in the shower, use a plastic, non-slip stool.  Keep the floor dry. Clean up  any water that spills on the floor as soon as it happens.  Remove soap buildup in the tub or shower regularly.  Attach bath mats securely with double-sided non-slip rug tape.  Do not have throw rugs and other things on the floor that can make you trip. What can I do in the bedroom?  Use night lights.  Make sure that you have a light by your bed that is easy to reach.  Do not use any sheets or blankets that are too big for your bed. They should not hang down onto the floor.  Have a firm chair that has side arms. You can use this for support while you get dressed.  Do not have throw rugs and other things on the floor that can make you trip. What can I do in the kitchen?  Clean up any spills right away.  Avoid walking on wet floors.  Keep items that you use a lot in easy-to-reach places.  If you need to reach something above you, use a strong step stool that has a grab bar.  Keep electrical cords out of the way.  Do not use floor polish or wax that makes  floors slippery. If you must use wax, use non-skid floor wax.  Do not have throw rugs and other things on the floor that can make you trip. What can I do with my stairs?  Do not leave any items on the stairs.  Make sure that there are handrails on both sides of the stairs and use them. Fix handrails that are broken or loose. Make sure that handrails are as long as the stairways.  Check any carpeting to make sure that it is firmly attached to the stairs. Fix any carpet that is loose or worn.  Avoid having throw rugs at the top or bottom of the stairs. If you do have throw rugs, attach them to the floor with carpet tape.  Make sure that you have a light switch at the top of the stairs and the bottom of the stairs. If you do not have them, ask someone to add them for you. What else can I do to help prevent falls?  Wear shoes that:  Do not have high heels.  Have rubber bottoms.  Are comfortable and fit you well.  Are  closed at the toe. Do not wear sandals.  If you use a stepladder:  Make sure that it is fully opened. Do not climb a closed stepladder.  Make sure that both sides of the stepladder are locked into place.  Ask someone to hold it for you, if possible.  Clearly mark and make sure that you can see:  Any grab bars or handrails.  First and last steps.  Where the edge of each step is.  Use tools that help you move around (mobility aids) if they are needed. These include:  Canes.  Walkers.  Scooters.  Crutches.  Turn on the lights when you go into a dark area. Replace any light bulbs as soon as they burn out.  Set up your furniture so you have a clear path. Avoid moving your furniture around.  If any of your floors are uneven, fix them.  If there are any pets around you, be aware of where they are.  Review your medicines with your doctor. Some medicines can make you feel dizzy. This can increase your chance of falling. Ask your doctor what other things that you can do to help prevent falls. This information is not intended to replace advice given to you by your health care provider. Make sure you discuss any questions you have with your health care provider. Document Released: 01/09/2009 Document Revised: 08/21/2015 Document Reviewed: 04/19/2014 Elsevier Interactive Patient Education  2017 [employer].

## 2018-03-16 NOTE — Progress Notes (Signed)
Patient: Candace [name], Female    DOB: [birth year], 67 y.o.   MRN: [id number] Visit Date: 03/16/2018  Today's Provider: [name], MD   Chief Complaint  Patient presents with  . Annual Exam   Subjective:    Annual physical visit Candace [name] is a 67 y.o. female. She feels well. She reports exercising not regularly, but she does try to stay active. She reports she is sleeping fairly well.  Colonoscopy- 10/23/2013. Repeat?  Mammogram- 12/30/2017. Normal. Repeat 1 year. Pap- s/p hysterectomy.  Tdap- 07/28/2010.     Review of Systems  Constitutional: Negative.   HENT: Negative.   Eyes: Negative.   Respiratory: Negative.   Cardiovascular: Negative for chest pain, palpitations and leg swelling.  Gastrointestinal: Negative.   Endocrine: Negative.   Genitourinary: Negative.   Musculoskeletal: Positive for arthralgias.  Skin: Negative.   Allergic/Immunologic: Negative.   Neurological: Positive for headaches.  Hematological: Negative.   Psychiatric/Behavioral: Negative.     Social History   Socioeconomic History  . Marital status: Divorced    Spouse name: Not on file  . Number of children: 3  . Years of education: Not on file  . Highest education level: Some college, no degree  Occupational History  . Occupation: retired  Scientific laboratory technician  . Financial resource strain: Not hard at all  . Food insecurity:    Worry: Never true    Inability: Never true  . Transportation needs:    Medical: No    Non-medical: No  Tobacco Use  . Smoking status: Former Smoker    Packs/day: 0.50    Types: Cigarettes    Last attempt to quit: 10/07/2001    Years since quitting: 16.4  . Smokeless tobacco: Never Used  Substance and Sexual Activity  . Alcohol use: No    Alcohol/week: 0.0 standard drinks  . Drug use: No  . Sexual activity: Not Currently  Lifestyle  . Physical activity:    Days per week: 0 days    Minutes per session: 0 min  . Stress: Not at all    Relationships  . Social connections:    Talks on phone: Patient refused    Gets together: Patient refused    Attends religious service: Patient refused    Active member of club or organization: Patient refused    Attends meetings of clubs or organizations: Patient refused    Relationship status: Patient refused  . Intimate partner violence:    Fear of current or ex partner: Patient refused    Emotionally abused: Patient refused    Physically abused: Patient refused    Forced sexual activity: Patient refused  Other Topics Concern  . Not on file  Social History Narrative  . Not on file    Past Medical History:  Diagnosis Date  . Allergy   . Anemia   . Anxiety   . Arrhythmia   . Arthritis   . Coronary artery disease   . Depression   . Diabetes mellitus without complication ([place])   . Dyspnea    doe  . Dysrhythmia   . GERD (gastroesophageal reflux disease)   . Headache   . History of hiatal hernia   . Hyperlipidemia   . Hypertension   . Myocardial infarction ([place])    2016, 04/2017  . Myocardial infarction with cardiac rehabilitation [facility])    MI 2016/ CABG 8/17    FINISHED CARDIAC REHAB 3 WEEKS AGO  . Panic attack   .  Pneumonia   . Reflux   . Stroke [facility]) 2015   showed up on MRI; no weakness noted  . TIA (transient ischemic attack)   . Voice tremor      Patient Active Problem List   Diagnosis Date Noted  . Pain in limb 02/28/2018  . COPD suggested by initial evaluation ([place]) 08/23/2017  . Acute and chronic respiratory failure with hypoxia ([place]) 08/23/2017  . Postoperative state 08/15/2017  . Incidental pulmonary nodule 08/01/2017  . B12 deficiency 12/01/2016  . Vitamin D deficiency 11/04/2016  . Depression with anxiety 09/30/2016  . PAF (paroxysmal atrial fibrillation) ([place]) 09/30/2016  . Chronic fatigue 09/30/2016  . Resting tremor 09/30/2016  . Mild obstructive sleep apnea 09/30/2016  . Headache 08/18/2016  . Unstable angina ([place]) 08/03/2016  . Diabetes  ([place]) 08/03/2016  . Asthma 11/11/2015  . S/P CABG x 4 11/10/2015  . Coronary artery disease 11/07/2015  . Carotid artery narrowing 02/08/2014  . Chest pain 02/08/2014  . Mixed hyperlipidemia 02/08/2014  . Atypical chest pain 02/07/2014  . Essential (primary) hypertension 02/07/2014  . Awareness of heartbeats 02/07/2014  . D (diarrhea) 10/05/2013  . Acid reflux 10/05/2013    Past Surgical History:  Procedure Laterality Date  . ABDOMINAL HYSTERECTOMY    . APPENDECTOMY  1975  . ARTERY BIOPSY Right 04/26/2016   Procedure: BIOPSY TEMPORAL ARTERY;  Surgeon: Margaretha Sheffield, MD;  Location: [facility] ORS;  Service: ENT;  Laterality: Right;  . CARDIAC CATHETERIZATION N/A 11/06/2015   Procedure: Left Heart Cath and Coronary Angiography;  Surgeon: Corey Skains, MD;  Location: [place] [facility];  Service: Cardiovascular;  Laterality: N/A;  . CESAREAN SECTION    . COLONOSCOPY  2015  . CORONARY ANGIOPLASTY  04/2017   [place]  . CORONARY ARTERY BYPASS GRAFT N/A 11/10/2015   Procedure: CORONARY ARTERY BYPASS GRAFTING (CABG), ON PUMP, TIMES FOUR, USING LEFT INTERNAL MAMMARY ARTERY, BILATERAL GREATER SAPHENOUS VEINS HARVESTED ENDOSCOPICALLY;  Surgeon: [name], MD;  Location: [place];  Service: Open Heart Surgery;  Laterality: N/A;  LIMA-LAD; SEQ SVG-OM1-OM2; SVG-PL  . CORONARY STENT INTERVENTION N/A 08/05/2016   Procedure: Coronary Stent Intervention;  Surgeon: Isaias Cowman, MD;  Location: [place] [facility];  Service: Cardiovascular;  Laterality: N/A;  . HYSTERECTOMY ABDOMINAL WITH SALPINGO-OOPHORECTOMY Bilateral 08/15/2017   Procedure: HYSTERECTOMY ABDOMINAL WITH BILATERAL SALPINGO-OOPHORECTOMY;  Surgeon: Rubie Maid, MD;  Location: [facility] OR[facility];  Service: Gynecology;  Laterality: Bilateral;  . LEFT HEART CATH AND CORONARY ANGIOGRAPHY N/A 08/05/2016   Procedure: Left Heart Cath and Coronary Angiography;  Surgeon: Isaias Cowman, MD;  Location: [place] [facility];   Service: Cardiovascular;  Laterality: N/A;  . TEE WITHOUT CARDIOVERSION N/A 11/10/2015   Procedure: TRANSESOPHAGEAL ECHOCARDIOGRAM (TEE);  Surgeon: [name], MD;  Location: [place];  Service: Open Heart Surgery;  Laterality: N/A;  . TUBAL LIGATION      Her family history includes Asthma in her father; Breast cancer (age of onset: 11) in her sister; Breast cancer (age of onset: 47) in her sister; Cancer in her father, mother, sister, and sister; Heart disease in her father; Hypertension in her father and mother; Lung cancer in her brother; Pancreatic cancer (age of onset: 41) in her mother; Pancreatic cancer (age of onset: 52) in her sister.      Current Outpatient Medications:  .  acetaminophen (TYLENOL) 500 MG tablet, Take 2 tablets (1,000 mg total) by mouth every 8 (eight) hours as needed., Disp: 60 tablet, Rfl: 1 .  clopidogrel (PLAVIX) 75 MG tablet, Take 75 mg by mouth daily., Disp: , Rfl:  .  Coenzyme Q10 (CO Q 10) 100 MG CAPS, Take 100 mg by mouth daily., Disp: 30 capsule, Rfl: 12 .  [employer] ASPIRIN ADULT LOW DOSE 81 MG chewable tablet, CHEW 1 TABLET (81 MG TOTAL) BY MOUTH DAILY., Disp: 90 tablet, Rfl: 3 .  cyproheptadine (PERIACTIN) 4 MG tablet, Take 4 mg by mouth 3 (three) times daily as needed for allergies., Disp: , Rfl:  .  furosemide (LASIX) 20 MG tablet, Take 20 mg by mouth daily., Disp: , Rfl:  .  HYDROcodone-acetaminophen (NORCO/VICODIN) 5-325 MG tablet, Take one tablet at night for pain; may take up to every 6 hours as needed for pain if not working or driving, Disp: , Rfl:  .  isosorbide mononitrate (IMDUR) 30 MG 24 hr tablet, Take 2 tablets (60 mg total) by mouth daily., Disp: 60 tablet, Rfl: 12 .  loperamide (IMODIUM) 2 MG capsule, Take by mouth as needed for diarrhea or loose stools., Disp: , Rfl:  .  metFORMIN (GLUCOPHAGE) 1000 MG tablet, Take 1 tablet (1,000 mg total) by mouth 2 (two) times daily with a meal., Disp: 180 tablet, Rfl: 3 .  metoprolol tartrate (LOPRESSOR) 50  MG tablet, TAKE 1 TABLET BY MOUTH TWICE A DAY FOR HIGH BLOOD PRESSURE, Disp: 60 tablet, Rfl: 12 .  nitroGLYCERIN (NITROSTAT) 0.4 MG SL tablet, Place 1 tablet (0.4 mg total) under the tongue every 5 (five) minutes as needed for chest pain., Disp: 30 tablet, Rfl: 0 .  omeprazole (PRILOSEC) 20 MG capsule, TAKE 1 CAPSULE BY MOUTH EVERY DAY, Disp: 90 capsule, Rfl: 1 .  ondansetron (ZOFRAN-ODT) 8 MG disintegrating tablet, DISSOLVE 1 TABLET ON THE TONGUE EVERY 8 HOURS AS NEEDED FOR NAUSEA OR VOMITING, Disp: 30 tablet, Rfl: 1 .  rosuvastatin (CRESTOR) 10 MG tablet, TAKE 1 TABLET BY MOUTH EVERY DAY, Disp: 90 tablet, Rfl: 3 .  sucralfate (CARAFATE) 1 g tablet, Take 1 tablet (1 g total) by mouth 2 (two) times daily., Disp: 60 tablet, Rfl: 5 .  tiZANidine (ZANAFLEX) 2 MG tablet, Take 2 mg by mouth at bedtime. , Disp: , Rfl:   Patient Care Team: [name]., MD as PCP - General (Family Medicine) Corey Skains, MD as Consulting Physician (Internal Medicine) Kris Hartmann, NP as Nurse Practitioner (Vascular Surgery) Sharlotte Alamo, DPM (Podiatry)     Objective:   Vitals: BP 132/72   Pulse 81   Temp 98.5 F (36.9 C)   Resp 16   Ht 5\' 6"  (1.676 m)   Wt 175 lb (79.4 kg)   BMI 28.25 kg/m   Physical Exam Constitutional:      Appearance: Normal appearance. She is well-developed.  HENT:     Head: Normocephalic and atraumatic.     Right Ear: External ear normal.     Left Ear: External ear normal.     Nose: Nose normal.     Mouth/Throat:     Pharynx: Oropharynx is clear.  Eyes:     General: No scleral icterus.    Conjunctiva/sclera: Conjunctivae normal.  Neck:     Thyroid: No thyromegaly.     Vascular: No carotid bruit.  Cardiovascular:     Rate and Rhythm: Normal rate and regular rhythm.     Heart sounds: Normal heart sounds.  Pulmonary:     Effort: Pulmonary effort is normal.  Chest:     Breasts:  Right: Normal.        Left: Normal.  Abdominal:     Palpations:  Abdomen is soft.  Musculoskeletal:     Comments: Trace LE edema.  Lymphadenopathy:     Cervical: No cervical adenopathy.  Skin:    General: Skin is warm and dry.  Neurological:     General: No focal deficit present.     Mental Status: She is alert and oriented to person, place, and time. Mental status is at baseline.  Psychiatric:        Behavior: Behavior normal.        Thought Content: Thought content normal.        Judgment: Judgment normal.     Activities of Daily Living In your present state of health, do you have any difficulty performing the following activities: 03/16/2018 08/15/2017  Hearing? Y N  Comment Does not wear hearing aids.  -  Vision? N N  Comment Wears readers.  -  Difficulty concentrating or making decisions? N N  Walking or climbing stairs? N N  Comment - -  Dressing or bathing? N N  Doing errands, shopping? N Y  Comment - -  Conservation officer, nature and eating ? N -  Using the Toilet? N -  In the past six months, have you accidently leaked urine? N -  Do you have problems with loss of bowel control? N -  Managing your Medications? N -  Managing your Finances? N -  Housekeeping or managing your Housekeeping? N -  Some recent data might be hidden    Fall Risk Assessment Fall Risk  03/16/2018 11/24/2017 06/02/2017 06/22/2016 04/19/2016  Falls in the past year? 0 No Yes Yes No  Number falls in past yr: 0 - 2 or more 2 or more -  Injury with Fall? 0 - No No -  Risk Factor Category  - - - High Fall Risk -  Risk for fall due to : - Impaired balance/gait;Impaired mobility History of fall(s) - -  Follow up - - Education provided;Falls prevention discussed Falls evaluation completed -     Depression Screen PHQ 2/9 Scores 03/16/2018 01/26/2018 12/13/2017 11/24/2017  PHQ - 2 Score 2 1 0 0  PHQ- 9 Score 4 5 3 4     6CIT Screen 03/16/2018  What Year? 0 points  What month? 0 points  What time? 0 points  Count back from 20 0 points  Months in reverse 0 points  Repeat  phrase 0 points  Total Score 0     Assessment & Plan:     Annual Wellness Visit  Reviewed patient's Family Medical History Reviewed and updated list of patient's medical providers Assessment of cognitive impairment was done Assessed patient's functional ability Established a written schedule for health screening [place] Completed and Reviewed  Exercise Activities and Dietary recommendations Goals    . DIET - INCREASE WATER INTAKE     Recommend to drink at least 6-8 8oz glasses of water per day.        Immunization History  Administered Date(s) Administered  . Influenza, High Dose Seasonal PF 12/24/2016, 12/08/2017  . Influenza,inj,Quad PF,6+ Mos 12/19/2015  . Influenza-Unspecified 03/06/2015  . Pneumococcal Conjugate-13 03/16/2018  . Pneumococcal Polysaccharide-23 12/24/2016  . Tdap 07/28/2010    Health Maintenance  Topic Date Due  . Hepatitis C Screening  [birth year]  . URINE MICROALBUMIN  06/08/1961  . DEXA SCAN  06/08/2016  . OPHTHALMOLOGY EXAM  06/17/2017  . HEMOGLOBIN A1C  07/14/2018  . COLONOSCOPY  10/24/2018  . FOOT EXAM  02/01/2019  . MAMMOGRAM  12/31/2019  . TETANUS/TDAP  07/27/2020  . INFLUENZA VACCINE  Completed  . PNA vac Low Risk Adult  Completed    Colonoscopy --repeat 2020 with apparent adenoma in 2015.Pneumovax today. Discussed health benefits of physical activity, and encouraged her to engage in regular exercise appropriate for her age and condition.  I have done the exam and reviewed the above chart and it is accurate to the best of my knowledge. Development worker, community has been used in this note in any air is in the dictation or transcription are unintentional.  [name], MD  [place]

## 2018-03-17 LAB — HEPATITIS C ANTIBODY: Hep C Virus Ab: <0.1 s/co ratio (ref 0.0–0.9)

## 2018-03-20 ENCOUNTER — Telehealth: Payer: Self-pay

## 2018-03-20 NOTE — Telephone Encounter (Signed)
-----   Message from [name]., MD sent at 03/20/2018  8:51 AM EST ----- Normal.

## 2018-03-20 NOTE — Telephone Encounter (Signed)
Patient has been advised. KW 

## 2018-03-27 ENCOUNTER — Other Ambulatory Visit: Payer: Self-pay

## 2018-03-27 ENCOUNTER — Emergency Department: Payer: Medicare Other

## 2018-03-27 ENCOUNTER — Telehealth: Payer: Self-pay

## 2018-03-27 ENCOUNTER — Encounter: Payer: Self-pay | Admitting: Emergency Medicine

## 2018-03-27 ENCOUNTER — Observation Stay
Admission: EM | Admit: 2018-03-27 | Discharge: 2018-03-28 | Disposition: A | Discharge status: Home / Self Care | Payer: Medicare Other | Attending: Internal Medicine | Admitting: Internal Medicine

## 2018-03-27 DIAGNOSIS — Z79899 Other long term (current) drug therapy: Secondary | ICD-10-CM | POA: Diagnosis not present

## 2018-03-27 DIAGNOSIS — Z7982 Long term (current) use of aspirin: Secondary | ICD-10-CM | POA: Diagnosis not present

## 2018-03-27 DIAGNOSIS — Z7984 Long term (current) use of oral hypoglycemic drugs: Secondary | ICD-10-CM | POA: Diagnosis not present

## 2018-03-27 DIAGNOSIS — Z90710 Acquired absence of both cervix and uterus: Secondary | ICD-10-CM | POA: Insufficient documentation

## 2018-03-27 DIAGNOSIS — Z9889 Other specified postprocedural states: Secondary | ICD-10-CM | POA: Diagnosis not present

## 2018-03-27 DIAGNOSIS — Z87891 Personal history of nicotine dependence: Secondary | ICD-10-CM | POA: Insufficient documentation

## 2018-03-27 DIAGNOSIS — Z880 Allergy status to penicillin: Secondary | ICD-10-CM | POA: Insufficient documentation

## 2018-03-27 DIAGNOSIS — K219 Gastro-esophageal reflux disease without esophagitis: Secondary | ICD-10-CM | POA: Diagnosis not present

## 2018-03-27 DIAGNOSIS — F419 Anxiety disorder, unspecified: Secondary | ICD-10-CM | POA: Insufficient documentation

## 2018-03-27 DIAGNOSIS — Z9851 Tubal ligation status: Secondary | ICD-10-CM | POA: Diagnosis not present

## 2018-03-27 DIAGNOSIS — Z803 Family history of malignant neoplasm of breast: Secondary | ICD-10-CM | POA: Insufficient documentation

## 2018-03-27 DIAGNOSIS — E1165 Type 2 diabetes mellitus with hyperglycemia: Secondary | ICD-10-CM | POA: Insufficient documentation

## 2018-03-27 DIAGNOSIS — R000 Tachycardia, unspecified: Principal | ICD-10-CM | POA: Insufficient documentation

## 2018-03-27 DIAGNOSIS — Z8249 Family history of ischemic heart disease and other diseases of the circulatory system: Secondary | ICD-10-CM | POA: Diagnosis not present

## 2018-03-27 DIAGNOSIS — I252 Old myocardial infarction: Secondary | ICD-10-CM | POA: Insufficient documentation

## 2018-03-27 DIAGNOSIS — Z8673 Personal history of transient ischemic attack (TIA), and cerebral infarction without residual deficits: Secondary | ICD-10-CM | POA: Diagnosis not present

## 2018-03-27 DIAGNOSIS — R0602 Shortness of breath: Secondary | ICD-10-CM | POA: Insufficient documentation

## 2018-03-27 DIAGNOSIS — Z951 Presence of aortocoronary bypass graft: Secondary | ICD-10-CM | POA: Diagnosis not present

## 2018-03-27 DIAGNOSIS — J40 Bronchitis, not specified as acute or chronic: Secondary | ICD-10-CM

## 2018-03-27 DIAGNOSIS — Z888 Allergy status to other drugs, medicaments and biological substances status: Secondary | ICD-10-CM | POA: Insufficient documentation

## 2018-03-27 DIAGNOSIS — Z7902 Long term (current) use of antithrombotics/antiplatelets: Secondary | ICD-10-CM | POA: Insufficient documentation

## 2018-03-27 DIAGNOSIS — E782 Mixed hyperlipidemia: Secondary | ICD-10-CM | POA: Insufficient documentation

## 2018-03-27 DIAGNOSIS — I25110 Atherosclerotic heart disease of native coronary artery with unstable angina pectoris: Secondary | ICD-10-CM | POA: Insufficient documentation

## 2018-03-27 DIAGNOSIS — R05 Cough: Secondary | ICD-10-CM | POA: Diagnosis present

## 2018-03-27 DIAGNOSIS — I10 Essential (primary) hypertension: Secondary | ICD-10-CM | POA: Diagnosis not present

## 2018-03-27 DIAGNOSIS — Z955 Presence of coronary angioplasty implant and graft: Secondary | ICD-10-CM | POA: Insufficient documentation

## 2018-03-27 LAB — CBC WITH DIFFERENTIAL/PLATELET
Abs Immature Granulocytes: 0.01 10*3/uL (ref 0.00–0.07)
Basophils Absolute: 0.1 10*3/uL (ref 0.0–0.1)
Basophils Relative: 1 %
Eosinophils Absolute: 0.2 10*3/uL (ref 0.0–0.5)
Eosinophils Relative: 2 %
HCT: 41.1 % (ref 36.0–46.0)
Hemoglobin: 13 g/dL (ref 12.0–15.0)
Immature Granulocytes: 0 %
Lymphocytes Relative: 29 %
Lymphs Abs: 2 10*3/uL (ref 0.7–4.0)
MCH: 29.6 pg (ref 26.0–34.0)
MCHC: 31.6 g/dL (ref 30.0–36.0)
MCV: 93.6 fL (ref 80.0–100.0)
Monocytes Absolute: 0.6 10*3/uL (ref 0.1–1.0)
Monocytes Relative: 10 %
Neutro Abs: 3.9 10*3/uL (ref 1.7–7.7)
Neutrophils Relative %: 58 %
Platelets: 210 10*3/uL (ref 150–400)
RBC: 4.39 MIL/uL (ref 3.87–5.11)
RDW: 13.2 % (ref 11.5–15.5)
WBC: 6.8 10*3/uL (ref 4.0–10.5)
nRBC: 0 % (ref 0.0–0.2)

## 2018-03-27 LAB — COMPREHENSIVE METABOLIC PANEL
ALT: 13 U/L (ref 0–44)
AST: 21 U/L (ref 15–41)
Albumin: 4.3 g/dL (ref 3.5–5.0)
Alkaline Phosphatase: 122 U/L (ref 38–126)
Anion gap: 9 (ref 5–15)
BUN: 6 mg/dL — ABNORMAL LOW (ref 8–23)
CO2: 22 mmol/L (ref 22–32)
Calcium: 9.2 mg/dL (ref 8.9–10.3)
Chloride: 108 mmol/L (ref 98–111)
Creatinine, Ser: 0.62 mg/dL (ref 0.44–1.00)
GFR calc Af Amer: >60 mL/min (ref >60)
GFR calc non Af Amer: >60 mL/min (ref >60)
Glucose, Bld: 224 mg/dL — ABNORMAL HIGH (ref 70–99)
Potassium: 3.5 mmol/L (ref 3.5–5.1)
Sodium: 139 mmol/L (ref 135–145)
Total Bilirubin: 0.6 mg/dL (ref 0.3–1.2)
Total Protein: 7.6 g/dL (ref 6.5–8.1)

## 2018-03-27 LAB — GLUCOSE, CAPILLARY
Glucose-Capillary: 393 mg/dL — ABNORMAL HIGH (ref 70–99)
Glucose-Capillary: 350 mg/dL — ABNORMAL HIGH (ref 70–99)

## 2018-03-27 LAB — INFLUENZA PANEL BY PCR (TYPE A & B)
Influenza A By PCR: NEGATIVE
Influenza B By PCR: NEGATIVE

## 2018-03-27 LAB — TROPONIN I: Troponin I: <0.03 ng/mL (ref <0.03)

## 2018-03-27 MED ORDER — METFORMIN HCL 500 MG PO TABS
1000.0000 mg | ORAL_TABLET | Freq: Two times a day (BID) | ORAL | Status: DC
  Administered 2018-03-27: 1000 mg via ORAL
  Filled 2018-03-27: qty 2

## 2018-03-27 MED ORDER — ONDANSETRON HCL 4 MG PO TABS: 4.0000 mg | ORAL_TABLET | Freq: Four times a day (QID) | ORAL | Status: DC | PRN

## 2018-03-27 MED ORDER — DOXYCYCLINE HYCLATE 100 MG PO TABS
100.0000 mg | ORAL_TABLET | Freq: Two times a day (BID) | ORAL | Status: DC
  Administered 2018-03-27 – 2018-03-28 (×3): 100 mg via ORAL
  Filled 2018-03-27 (×3): qty 1

## 2018-03-27 MED ORDER — IPRATROPIUM BROMIDE 0.02 % IN SOLN: 0.5000 mg | RESPIRATORY_TRACT | Status: DC | PRN

## 2018-03-27 MED ORDER — ACETAMINOPHEN 325 MG PO TABS
650.0000 mg | ORAL_TABLET | Freq: Four times a day (QID) | ORAL | Status: DC | PRN
  Administered 2018-03-27 – 2018-03-28 (×2): 650 mg via ORAL
  Filled 2018-03-27 (×2): qty 2

## 2018-03-27 MED ORDER — LEVALBUTEROL HCL 1.25 MG/0.5ML IN NEBU
1.2500 mg | INHALATION_SOLUTION | Freq: Three times a day (TID) | RESPIRATORY_TRACT | Status: DC
  Administered 2018-03-27 – 2018-03-28 (×3): 1.25 mg via RESPIRATORY_TRACT
  Filled 2018-03-27 (×4): qty 0.5

## 2018-03-27 MED ORDER — SUCRALFATE 1 G PO TABS
1.0000 g | ORAL_TABLET | Freq: Two times a day (BID) | ORAL | Status: DC
  Administered 2018-03-27 – 2018-03-28 (×3): 1 g via ORAL
  Filled 2018-03-27 (×3): qty 1

## 2018-03-27 MED ORDER — IPRATROPIUM-ALBUTEROL 0.5-2.5 (3) MG/3ML IN SOLN
3.0000 mL | Freq: Once | RESPIRATORY_TRACT | Status: AC
  Administered 2018-03-27: 3 mL via RESPIRATORY_TRACT
  Filled 2018-03-27: qty 3

## 2018-03-27 MED ORDER — METOPROLOL TARTRATE 50 MG PO TABS
50.0000 mg | ORAL_TABLET | Freq: Once | ORAL | Status: AC
  Administered 2018-03-27: 50 mg via ORAL
  Filled 2018-03-27: qty 1

## 2018-03-27 MED ORDER — ROSUVASTATIN CALCIUM 10 MG PO TABS
10.0000 mg | ORAL_TABLET | Freq: Every day | ORAL | Status: DC
  Administered 2018-03-27 – 2018-03-28 (×2): 10 mg via ORAL
  Filled 2018-03-27 (×2): qty 1

## 2018-03-27 MED ORDER — MAGNESIUM SULFATE 2 GM/50ML IV SOLN
2.0000 g | Freq: Once | INTRAVENOUS | Status: AC
  Administered 2018-03-27: 2 g via INTRAVENOUS
  Filled 2018-03-27: qty 50

## 2018-03-27 MED ORDER — INSULIN ASPART 100 UNIT/ML ~~LOC~~ SOLN
0.0000 [IU] | Freq: Three times a day (TID) | SUBCUTANEOUS | Status: DC
  Administered 2018-03-27: 7 [IU] via SUBCUTANEOUS
  Filled 2018-03-27: qty 1

## 2018-03-27 MED ORDER — GUAIFENESIN-DM 100-10 MG/5ML PO SYRP
5.0000 mL | ORAL_SOLUTION | ORAL | Status: DC | PRN
  Administered 2018-03-28: 5 mL via ORAL
  Filled 2018-03-27 (×2): qty 5

## 2018-03-27 MED ORDER — ASPIRIN 81 MG PO CHEW
81.0000 mg | CHEWABLE_TABLET | Freq: Every day | ORAL | Status: DC
  Administered 2018-03-28: 81 mg via ORAL
  Filled 2018-03-27: qty 1

## 2018-03-27 MED ORDER — LEVALBUTEROL HCL 1.25 MG/0.5ML IN NEBU
1.2500 mg | INHALATION_SOLUTION | Freq: Three times a day (TID) | RESPIRATORY_TRACT | Status: DC
  Filled 2018-03-27: qty 0.5

## 2018-03-27 MED ORDER — GUAIFENESIN ER 600 MG PO TB12
600.0000 mg | ORAL_TABLET | Freq: Two times a day (BID) | ORAL | Status: DC
  Administered 2018-03-27 – 2018-03-28 (×2): 600 mg via ORAL
  Filled 2018-03-27 (×2): qty 1

## 2018-03-27 MED ORDER — SODIUM CHLORIDE 0.9 % IV BOLUS
500.0000 mL | Freq: Once | INTRAVENOUS | Status: AC
  Administered 2018-03-27: 500 mL via INTRAVENOUS

## 2018-03-27 MED ORDER — ENOXAPARIN SODIUM 40 MG/0.4ML ~~LOC~~ SOLN
40.0000 mg | SUBCUTANEOUS | Status: DC
  Administered 2018-03-27: 40 mg via SUBCUTANEOUS
  Filled 2018-03-27: qty 0.4

## 2018-03-27 MED ORDER — TIZANIDINE HCL 2 MG PO TABS
2.0000 mg | ORAL_TABLET | Freq: Every day | ORAL | Status: DC
  Administered 2018-03-27: 2 mg via ORAL
  Filled 2018-03-27 (×2): qty 1

## 2018-03-27 MED ORDER — NITROGLYCERIN 0.4 MG SL SUBL: 0.4000 mg | SUBLINGUAL_TABLET | SUBLINGUAL | Status: DC | PRN

## 2018-03-27 MED ORDER — ONDANSETRON HCL 4 MG/2ML IJ SOLN: 4.0000 mg | Freq: Four times a day (QID) | INTRAMUSCULAR | Status: DC | PRN

## 2018-03-27 MED ORDER — CYPROHEPTADINE HCL 4 MG PO TABS
4.0000 mg | ORAL_TABLET | Freq: Three times a day (TID) | ORAL | Status: DC | PRN
  Filled 2018-03-27: qty 1

## 2018-03-27 MED ORDER — LIDOCAINE HCL (PF) 4 % IJ SOLN
4.0000 mL | Freq: Once | INTRAMUSCULAR | Status: AC
  Administered 2018-03-27: 4 mL via RESPIRATORY_TRACT
  Filled 2018-03-27: qty 5

## 2018-03-27 MED ORDER — ALBUTEROL SULFATE (2.5 MG/3ML) 0.083% IN NEBU: 5.0000 mg | INHALATION_SOLUTION | Freq: Once | RESPIRATORY_TRACT | Status: DC

## 2018-03-27 MED ORDER — SODIUM CHLORIDE 0.9 % IV SOLN
INTRAVENOUS | Status: DC
  Administered 2018-03-27 – 2018-03-28 (×2): via INTRAVENOUS

## 2018-03-27 MED ORDER — IPRATROPIUM BROMIDE 0.02 % IN SOLN
0.5000 mg | Freq: Three times a day (TID) | RESPIRATORY_TRACT | Status: DC
  Administered 2018-03-27 – 2018-03-28 (×3): 0.5 mg via RESPIRATORY_TRACT
  Filled 2018-03-27 (×3): qty 2.5

## 2018-03-27 MED ORDER — CLOPIDOGREL BISULFATE 75 MG PO TABS
75.0000 mg | ORAL_TABLET | Freq: Every day | ORAL | Status: DC
  Administered 2018-03-28: 75 mg via ORAL
  Filled 2018-03-27: qty 1

## 2018-03-27 MED ORDER — INSULIN ASPART 100 UNIT/ML ~~LOC~~ SOLN
0.0000 [IU] | Freq: Three times a day (TID) | SUBCUTANEOUS | Status: DC
  Administered 2018-03-27: 9 [IU] via SUBCUTANEOUS
  Filled 2018-03-27: qty 1

## 2018-03-27 MED ORDER — ACETAMINOPHEN 650 MG RE SUPP: 650.0000 mg | Freq: Four times a day (QID) | RECTAL | Status: DC | PRN

## 2018-03-27 MED ORDER — BUDESONIDE 0.25 MG/2ML IN SUSP
0.2500 mg | Freq: Two times a day (BID) | RESPIRATORY_TRACT | Status: DC
  Administered 2018-03-27 – 2018-03-28 (×2): 0.25 mg via RESPIRATORY_TRACT
  Filled 2018-03-27 (×2): qty 2

## 2018-03-27 MED ORDER — GUAIFENESIN ER 600 MG PO TB12
600.0000 mg | ORAL_TABLET | Freq: Two times a day (BID) | ORAL | Status: DC
  Filled 2018-03-27 (×2): qty 1

## 2018-03-27 MED ORDER — GUAIFENESIN 100 MG/5ML PO SOLN
5.0000 mL | Freq: Once | ORAL | Status: AC
  Administered 2018-03-27: 100 mg via ORAL
  Filled 2018-03-27: qty 5

## 2018-03-27 MED ORDER — FUROSEMIDE 20 MG PO TABS
20.0000 mg | ORAL_TABLET | Freq: Every day | ORAL | Status: DC
  Administered 2018-03-27: 20 mg via ORAL
  Filled 2018-03-27: qty 1

## 2018-03-27 MED ORDER — METHYLPREDNISOLONE SODIUM SUCC 125 MG IJ SOLR
125.0000 mg | Freq: Once | INTRAMUSCULAR | Status: AC
  Administered 2018-03-27: 125 mg via INTRAVENOUS
  Filled 2018-03-27: qty 2

## 2018-03-27 MED ORDER — IOHEXOL 350 MG/ML SOLN
75.0000 mL | Freq: Once | INTRAVENOUS | Status: AC | PRN
  Administered 2018-03-27: 75 mL via INTRAVENOUS

## 2018-03-27 MED ORDER — ISOSORBIDE MONONITRATE ER 60 MG PO TB24
60.0000 mg | ORAL_TABLET | Freq: Every day | ORAL | Status: DC
  Administered 2018-03-27: 60 mg via ORAL
  Filled 2018-03-27: qty 1

## 2018-03-27 MED ORDER — METOPROLOL TARTRATE 50 MG PO TABS
100.0000 mg | ORAL_TABLET | Freq: Two times a day (BID) | ORAL | Status: DC
  Administered 2018-03-27: 100 mg via ORAL
  Filled 2018-03-27 (×2): qty 2

## 2018-03-27 NOTE — ED Provider Notes (Signed)
[facility] Emergency Department Provider Note    First MD Initiated Contact with Patient 03/27/18 [phone]     (approximate)  I have reviewed the triage vital signs and the nursing notes.   HISTORY  Chief Complaint Cough    HPI Candace [name] is a 67 y.o. female below listed past medical history not on home oxygen presents the ER with cough and chest pain since Thursday.  Has been having congestion as well as sore throat.  States that the pain in her chest is particular related to coughing.  She does have a history of bronchitis.  Has not been on any antibiotics or steroids recently.  Came to the ER this morning because she felt like her throat was closing up and she could not catch her breath.    Past Medical History:  Diagnosis Date  . Allergy   . Anemia   . Anxiety   . Arrhythmia   . Arthritis   . Coronary artery disease   . Depression   . Diabetes mellitus without complication ([place])   . Dyspnea    doe  . Dysrhythmia   . GERD (gastroesophageal reflux disease)   . Headache   . History of hiatal hernia   . Hyperlipidemia   . Hypertension   . Myocardial infarction ([place])    2016, 04/2017  . Myocardial infarction with cardiac rehabilitation [facility])    MI 2016/ CABG 8/17    FINISHED CARDIAC REHAB 3 WEEKS AGO  . Panic attack   . Pneumonia   . Reflux   . Stroke [facility]) 2015   showed up on MRI; no weakness noted  . TIA (transient ischemic attack)   . Voice tremor    Family History  Problem Relation Age of Onset  . Cancer Father   . Hypertension Father   . Heart disease Father   . Asthma Father   . Cancer Mother   . Hypertension Mother   . Pancreatic cancer Mother 63  . Cancer Sister   . Breast cancer Sister 20  . Breast cancer Sister 37  . Lung cancer Brother   . Pancreatic cancer Sister 27  . Cancer Sister    Past Surgical History:  Procedure Laterality Date  . ABDOMINAL HYSTERECTOMY    . APPENDECTOMY  1975  . ARTERY BIOPSY Right  04/26/2016   Procedure: BIOPSY TEMPORAL ARTERY;  Surgeon: Margaretha Sheffield, MD;  Location: [facility] ORS;  Service: ENT;  Laterality: Right;  . CARDIAC CATHETERIZATION N/A 11/06/2015   Procedure: Left Heart Cath and Coronary Angiography;  Surgeon: Corey Skains, MD;  Location: [place] CV LAB;  Service: Cardiovascular;  Laterality: N/A;  . CESAREAN SECTION    . COLONOSCOPY  2015  . CORONARY ANGIOPLASTY  04/2017   [place]  . CORONARY ARTERY BYPASS GRAFT N/A 11/10/2015   Procedure: CORONARY ARTERY BYPASS GRAFTING (CABG), ON PUMP, TIMES FOUR, USING LEFT INTERNAL MAMMARY ARTERY, BILATERAL GREATER SAPHENOUS VEINS HARVESTED ENDOSCOPICALLY;  Surgeon: [name], MD;  Location: [place];  Service: Open Heart Surgery;  Laterality: N/A;  LIMA-LAD; SEQ SVG-OM1-OM2; SVG-PL  . CORONARY STENT INTERVENTION N/A 08/05/2016   Procedure: Coronary Stent Intervention;  Surgeon: Isaias Cowman, MD;  Location: [place] CV LAB;  Service: Cardiovascular;  Laterality: N/A;  . HYSTERECTOMY ABDOMINAL WITH SALPINGO-OOPHORECTOMY Bilateral 08/15/2017   Procedure: HYSTERECTOMY ABDOMINAL WITH BILATERAL SALPINGO-OOPHORECTOMY;  Surgeon: Rubie Maid, MD;  Location: [facility] ORS;  Service: Gynecology;  Laterality: Bilateral;  . LEFT HEART CATH  AND CORONARY ANGIOGRAPHY N/A 08/05/2016   Procedure: Left Heart Cath and Coronary Angiography;  Surgeon: Isaias Cowman, MD;  Location: [place] CV LAB;  Service: Cardiovascular;  Laterality: N/A;  . TEE WITHOUT CARDIOVERSION N/A 11/10/2015   Procedure: TRANSESOPHAGEAL ECHOCARDIOGRAM (TEE);  Surgeon: [name], MD;  Location: [place];  Service: Open Heart Surgery;  Laterality: N/A;  . TUBAL LIGATION     Patient Active Problem List   Diagnosis Date Noted  . Tachycardia 03/27/2018  . Pain in limb 02/28/2018  . COPD suggested by initial evaluation ([place]) 08/23/2017  . Acute and chronic respiratory failure with hypoxia ([place]) 08/23/2017  . Postoperative  state 08/15/2017  . Incidental pulmonary nodule 08/01/2017  . B12 deficiency 12/01/2016  . Vitamin D deficiency 11/04/2016  . Depression with anxiety 09/30/2016  . PAF (paroxysmal atrial fibrillation) ([place]) 09/30/2016  . Chronic fatigue 09/30/2016  . Resting tremor 09/30/2016  . Mild obstructive sleep apnea 09/30/2016  . Headache 08/18/2016  . Unstable angina ([place]) 08/03/2016  . Diabetes ([employer]) 08/03/2016  . Asthma 11/11/2015  . S/P CABG x 4 11/10/2015  . Coronary artery disease 11/07/2015  . Carotid artery narrowing 02/08/2014  . Chest pain 02/08/2014  . Mixed hyperlipidemia 02/08/2014  . Atypical chest pain 02/07/2014  . Essential (primary) hypertension 02/07/2014  . Awareness of heartbeats 02/07/2014  . D (diarrhea) 10/05/2013  . Acid reflux 10/05/2013      Prior to Admission medications   Medication Sig Start Date End Date Taking? Authorizing Provider  acetaminophen (TYLENOL) 500 MG tablet Take 2 tablets (1,000 mg total) by mouth every 8 (eight) hours as needed. 08/18/17  Yes Rubie Maid, MD  clopidogrel (PLAVIX) 75 MG tablet Take 75 mg by mouth daily.   Yes [provider]  Coenzyme Q10 (CO Q 10) 100 MG CAPS Take 100 mg by mouth daily. 04/25/17  Yes [name]., MD  [employer] ASPIRIN ADULT LOW DOSE 81 MG chewable tablet CHEW 1 TABLET (81 MG TOTAL) BY MOUTH DAILY. Patient taking differently: Chew 81 mg by mouth daily.  11/04/17  Yes [name]., MD  cyproheptadine (PERIACTIN) 4 MG tablet Take 4 mg by mouth 3 (three) times daily as needed for allergies.   Yes [provider]  furosemide (LASIX) 20 MG tablet Take 20 mg by mouth daily.   Yes [provider]  isosorbide mononitrate (IMDUR) 30 MG 24 hr tablet Take 2 tablets (60 mg total) by mouth daily. 11/04/16  Yes Plonk, Gwyndolyn Saxon, MD  loperamide (IMODIUM) 2 MG capsule Take by mouth as needed for diarrhea or loose stools.   Yes [provider]  metFORMIN (GLUCOPHAGE) 1000 MG tablet  Take 1 tablet (1,000 mg total) by mouth 2 (two) times daily with a meal. 02/02/18  Yes [name]., MD  metoprolol tartrate (LOPRESSOR) 50 MG tablet TAKE 1 TABLET BY MOUTH TWICE A DAY FOR HIGH BLOOD PRESSURE Patient taking differently: Take 50 mg by mouth 2 (two) times daily.  02/24/17  Yes [name]., MD  nitroGLYCERIN (NITROSTAT) 0.4 MG SL tablet Place 1 tablet (0.4 mg total) under the tongue every 5 (five) minutes as needed for chest pain. 07/27/16  Yes Mody, Sital, MD  ondansetron (ZOFRAN-ODT) 8 MG disintegrating tablet DISSOLVE 1 TABLET ON THE TONGUE EVERY 8 HOURS AS NEEDED FOR NAUSEA OR VOMITING Patient taking differently: Take 8 mg by mouth every 8 (eight) hours as needed for nausea or vomiting.  11/04/17  Yes Birdie Sons,  MD  rosuvastatin (CRESTOR) 10 MG tablet TAKE 1 TABLET BY MOUTH EVERY DAY Patient taking differently: Take 10 mg by mouth daily.  03/08/18  Yes [name]., MD  sucralfate (CARAFATE) 1 g tablet Take 1 tablet (1 g total) by mouth 2 (two) times daily. 10/06/17  Yes [name]., MD  tiZANidine (ZANAFLEX) 2 MG tablet Take 2 mg by mouth at bedtime.    Yes [provider]    Allergies Lisinopril and Penicillins    Social History Social History   Tobacco Use  . Smoking status: Former Smoker    Packs/day: 0.50    Types: Cigarettes    Last attempt to quit: 10/07/2001    Years since quitting: 16.4  . Smokeless tobacco: Never Used  Substance Use Topics  . Alcohol use: No    Alcohol/week: 0.0 standard drinks  . Drug use: No    Review of Systems Patient denies headaches, rhinorrhea, blurry vision, numbness, shortness of breath, chest pain, edema, cough, abdominal pain, nausea, vomiting, diarrhea, dysuria, fevers, rashes or hallucinations unless otherwise stated above in HPI. ____________________________________________   PHYSICAL EXAM:  VITAL SIGNS: Vitals:   03/27/18 1400 03/27/18 1416  BP:  (!) 156/83    Pulse: (!) 102 98  Resp:    Temp:    SpO2: 96% 98%    Constitutional: Alert and oriented.  Eyes: Conjunctivae are normal.  Head: Atraumatic. Nose: No congestion/rhinnorhea. Mouth/Throat: Mucous membranes are moist.   Neck: No stridor. Painless ROM.  Cardiovascular: tachycardic rate, regular rhythm. Grossly normal heart sounds.  Good peripheral circulation. Respiratory: mildly increased respiratory effort.  No retractions. Lungs Coarse bibasilar breathsoudns Gastrointestinal: Soft and nontender. No distention. No abdominal bruits. No CVA tenderness. Genitourinary: deferred Musculoskeletal: No lower extremity tenderness nor edema.  No joint effusions. Neurologic:  Normal speech and language. No gross focal neurologic deficits are appreciated. No facial droop Skin:  Skin is warm, dry and intact. No rash noted. Psychiatric: Mood and affect are normal. Speech and behavior are normal.  ____________________________________________   LABS (all labs ordered are listed, but only abnormal results are displayed)  Results for orders placed or performed during the hospital encounter of 03/27/18 (from the past 24 hour(s))  CBC with Differential/Platelet     Status: None   Collection Time: 03/27/18  9:19 AM  Result Value Ref Range   WBC 6.8 4.0 - 10.5 K/uL   RBC 4.39 3.87 - 5.11 MIL/uL   Hemoglobin 13.0 12.0 - 15.0 g/dL   HCT 41.1 36.0 - 46.0 %   MCV 93.6 80.0 - 100.0 fL   MCH 29.6 26.0 - 34.0 pg   MCHC 31.6 30.0 - 36.0 g/dL   RDW 13.2 11.5 - 15.5 %   Platelets 210 150 - 400 K/uL   nRBC 0.0 0.0 - 0.2 %   Neutrophils Relative % 58 %   Neutro Abs 3.9 1.7 - 7.7 K/uL   Lymphocytes Relative 29 %   Lymphs Abs 2.0 0.7 - 4.0 K/uL   Monocytes Relative 10 %   Monocytes Absolute 0.6 0.1 - 1.0 K/uL   Eosinophils Relative 2 %   Eosinophils Absolute 0.2 0.0 - 0.5 K/uL   Basophils Relative 1 %   Basophils Absolute 0.1 0.0 - 0.1 K/uL   Immature Granulocytes 0 %   Abs Immature Granulocytes 0.01  0.00 - 0.07 K/uL  Comprehensive metabolic panel     Status: Abnormal   Collection Time: 03/27/18  9:19 AM  Result Value Ref  Range   Sodium 139 135 - 145 mmol/L   Potassium 3.5 3.5 - 5.1 mmol/L   Chloride 108 98 - 111 mmol/L   CO2 22 22 - 32 mmol/L   Glucose, Bld 224 (H) 70 - 99 mg/dL   BUN 6 (L) 8 - 23 mg/dL   Creatinine, Ser 0.62 0.44 - 1.00 mg/dL   Calcium 9.2 8.9 - 10.3 mg/dL   Total Protein 7.6 6.5 - 8.1 g/dL   Albumin 4.3 3.5 - 5.0 g/dL   AST 21 15 - 41 U/L   ALT 13 0 - 44 U/L   Alkaline Phosphatase 122 38 - 126 U/L   Total Bilirubin 0.6 0.3 - 1.2 mg/dL   GFR calc non Af Amer >60 >60 mL/min   GFR calc Af Amer >60 >60 mL/min   Anion gap 9 5 - 15  Troponin I - Add-On to previous collection     Status: None   Collection Time: 03/27/18  9:19 AM  Result Value Ref Range   Troponin I <0.03 <0.03 ng/mL  Influenza panel by PCR (type A & B)     Status: None   Collection Time: 03/27/18 10:02 AM  Result Value Ref Range   Influenza A By PCR NEGATIVE NEGATIVE   Influenza B By PCR NEGATIVE NEGATIVE   ____________________________________________  EKG My review and personal interpretation at Time: 9:08   Indication: sob  Rate: 130  Rhythm: sinus Axis: normal Other: nonspecific inferolateral st abn, likely rate dependent ____________________________________________  RADIOLOGY  I personally reviewed all radiographic images ordered to evaluate for the above acute complaints and reviewed radiology reports and findings.  These findings were personally discussed with the patient.  Please see medical record for radiology report.  ____________________________________________   PROCEDURES  Procedure(s) performed:  Procedures    Critical Care performed: no ____________________________________________   INITIAL IMPRESSION / ASSESSMENT AND PLAN / ED COURSE  Pertinent labs & imaging results that were available during my care of the patient were reviewed by me and considered in my  medical decision making (see chart for details).   DDX: copd, bronchitis, pna, pe, acs, dehydration, flu  Candace [name] is a 67 y.o. who presents to the ED with cough shortness of breath is described above.  She is afebrile but fairly tachycardic with tachypnea.  Blood will be sent for the above differential.  Certainly sounds bronchospastic by exam.  Will give nebulizers as well as steroids.  EKG does show nonspecific changes likely rate dependent.  Doubt ACS.  Depending on response to bronchodilators will likely repeat order CT angiogram of the chest to evaluate for PE or other explanation of her cough and tachycardia as her chest x-ray is grossly unremarkable.  Clinical Course as of Mar 28 1503  Mon Mar 27, 2018  1350 No evidence of PE or consolidation.  With ambulation patient became significantly tachycardic as well as worsening tachypnea requiring rest breaks every few steps due to exertional dyspnea.   [PR]    Clinical Course User Index [PR] Merlyn Lot, MD     As part of my medical decision making, I reviewed the following data within the [place] notes reviewed and incorporated, Labs reviewed, notes from prior ED visits.  ____________________________________________   FINAL CLINICAL IMPRESSION(S) / ED DIAGNOSES  Final diagnoses:  Bronchitis  Tachycardia      NEW MEDICATIONS STARTED DURING THIS VISIT:  New Prescriptions   No medications on file     Note:  This document was prepared using Dragon voice recognition software and may include unintentional dictation errors.    Merlyn Lot, MD 03/27/18 [phone]

## 2018-03-27 NOTE — ED Notes (Signed)
Patient transported to CT 

## 2018-03-27 NOTE — ED Notes (Signed)
Pt ambulating with Candace [name], Runner, broadcasting/film/video through hallway.  Pt with steady gait, oxygen saturation maintained 97% RA, heart rate 116, patient stated SOB while walking and stopped 4 times during short walk.

## 2018-03-27 NOTE — Telephone Encounter (Signed)
Patient calling to the medical call center and left the following message with Mayford Knife, RN: "She has had a cold since Thursday. Is having sneezing, coughing, itchy eyes, sore throat and chills (no fever). I called patient and patient stated she was at the hospital ER and they admitted patient due to elevated heart rate (tachycardia). Also patient states that they dx her with bronchitis.

## 2018-03-27 NOTE — Progress Notes (Signed)
Advanced care plan.  Purpose of the Encounter: CODE STATUS  Parties in Attendance: Patient herself  Patient's Decision Capacity:intact  Subjective/Patient's story: Candace [name]  is a 67 y.o. female with a known history of seasonal allergies, anxiety, arrhythmia, coronary artery disease, diabetes type 2, history of recurrent bronchitis, GERD, hypertension, hyperlipidemia presenting with complaint of shortness of breath and cough since last Thursday.   Objective/Medical story  I discussed with the patient regarding her desires for cardiac and pulmonary resuscitation  Goals of care determination:   Patient states that she would like to be a full code but would only like to have CPR done and wants if does not improve would not want to have aggressive continuation  CODE STATUS: Full code for now   Time spent discussing advanced care planning: 16 minutes

## 2018-03-27 NOTE — H&P (Signed)
[facility] NAME: Candace [name]    MR#:  [id number]  DATE OF BIRTH:  April 19, 1950  DATE OF ADMISSION:  03/27/2018  PRIMARY CARE PHYSICIAN: [name]., MD   REQUESTING/REFERRING PHYSICIAN: Merlyn Lot, MD  CHIEF COMPLAINT:   Chief Complaint  Patient presents with  . Cough    HISTORY OF PRESENT ILLNESS: Candace [name]  is a 67 y.o. female with a known history of seasonal allergies, anxiety, arrhythmia, coronary artery disease, diabetes type 2, history of recurrent bronchitis, GERD, hypertension, hyperlipidemia presenting with complaint of shortness of breath and cough since last Thursday.  Patient states that her symptoms have gotten worse.  She states that the cough has been productive.  She is treated herself with Mucinex Robitussin with no significant improvement.  Has had chills but no fevers.  The ER patient's had a CT scan of the chest which failed to show any pneumonia.  He also had flu test check which was negative.  Patient also noticed to have sinus tachycardia with heart rate elevated in the 140s 150s.  Patient does report that she has a history of having elevated heart rate in the past.  She denies any chest pain or palpitations or syncope.  PAST MEDICAL HISTORY:   Past Medical History:  Diagnosis Date  . Allergy   . Anemia   . Anxiety   . Arrhythmia   . Arthritis   . Coronary artery disease   . Depression   . Diabetes mellitus without complication ([place])   . Dyspnea    doe  . Dysrhythmia   . GERD (gastroesophageal reflux disease)   . Headache   . History of hiatal hernia   . Hyperlipidemia   . Hypertension   . Myocardial infarction ([place])    2016, 04/2017  . Myocardial infarction with cardiac rehabilitation [facility])    MI 2016/ CABG 8/17    FINISHED CARDIAC REHAB 3 WEEKS AGO  . Panic attack   . Pneumonia   . Reflux   . Stroke [facility]) 2015   showed up on MRI; no weakness noted  . TIA (transient ischemic  attack)   . Voice tremor     PAST SURGICAL HISTORY:  Past Surgical History:  Procedure Laterality Date  . ABDOMINAL HYSTERECTOMY    . APPENDECTOMY  1975  . ARTERY BIOPSY Right 04/26/2016   Procedure: BIOPSY TEMPORAL ARTERY;  Surgeon: Margaretha Sheffield, MD;  Location: [facility] ORS;  Service: ENT;  Laterality: Right;  . CARDIAC CATHETERIZATION N/A 11/06/2015   Procedure: Left Heart Cath and Coronary Angiography;  Surgeon: Corey Skains, MD;  Location: [place] CV LAB;  Service: Cardiovascular;  Laterality: N/A;  . CESAREAN SECTION    . COLONOSCOPY  2015  . CORONARY ANGIOPLASTY  04/2017   [place]  . CORONARY ARTERY BYPASS GRAFT N/A 11/10/2015   Procedure: CORONARY ARTERY BYPASS GRAFTING (CABG), ON PUMP, TIMES FOUR, USING LEFT INTERNAL MAMMARY ARTERY, BILATERAL GREATER SAPHENOUS VEINS HARVESTED ENDOSCOPICALLY;  Surgeon: [name], MD;  Location: [place];  Service: Open Heart Surgery;  Laterality: N/A;  LIMA-LAD; SEQ SVG-OM1-OM2; SVG-PL  . CORONARY STENT INTERVENTION N/A 08/05/2016   Procedure: Coronary Stent Intervention;  Surgeon: Isaias Cowman, MD;  Location: [place] CV LAB;  Service: Cardiovascular;  Laterality: N/A;  . HYSTERECTOMY ABDOMINAL WITH SALPINGO-OOPHORECTOMY Bilateral 08/15/2017   Procedure: HYSTERECTOMY ABDOMINAL WITH BILATERAL SALPINGO-OOPHORECTOMY;  Surgeon: Rubie Maid, MD;  Location: [facility] ORS;  Service: Gynecology;  Laterality:  Bilateral;  . LEFT HEART CATH AND CORONARY ANGIOGRAPHY N/A 08/05/2016   Procedure: Left Heart Cath and Coronary Angiography;  Surgeon: Isaias Cowman, MD;  Location: [place] CV LAB;  Service: Cardiovascular;  Laterality: N/A;  . TEE WITHOUT CARDIOVERSION N/A 11/10/2015   Procedure: TRANSESOPHAGEAL ECHOCARDIOGRAM (TEE);  Surgeon: [name], MD;  Location: [place];  Service: Open Heart Surgery;  Laterality: N/A;  . TUBAL LIGATION      SOCIAL HISTORY:  Social History   Tobacco Use  . Smoking status:  Former Smoker    Packs/day: 0.50    Types: Cigarettes    Last attempt to quit: 10/07/2001    Years since quitting: 16.4  . Smokeless tobacco: Never Used  Substance Use Topics  . Alcohol use: No    Alcohol/week: 0.0 standard drinks    FAMILY HISTORY:  Family History  Problem Relation Age of Onset  . Cancer Father   . Hypertension Father   . Heart disease Father   . Asthma Father   . Cancer Mother   . Hypertension Mother   . Pancreatic cancer Mother 46  . Cancer Sister   . Breast cancer Sister 78  . Breast cancer Sister 55  . Lung cancer Brother   . Pancreatic cancer Sister 55  . Cancer Sister     DRUG ALLERGIES:  Allergies  Allergen Reactions  . Lisinopril     cough  . Penicillins Swelling, Rash and Other (See Comments)    Has patient had a PCN reaction causing immediate rash, facial/tongue/throat swelling, SOB or lightheadedness with hypotension: Yes Has patient had a PCN reaction causing severe rash involving mucus membranes or skin necrosis: Yes Has patient had a PCN reaction that required hospitalization No Has patient had a PCN reaction occurring within the last 10 years: Yes If all of the above answers are "NO", then may proceed with Cephalosporin use.     REVIEW OF SYSTEMS:   CONSTITUTIONAL: No fever, fatigue or weakness.  EYES: No blurred or double vision.  EARS, NOSE, AND THROAT: No tinnitus or ear pain.  RESPIRATORY: Positive cough, positive shortness of breath, wheezing or hemoptysis.  CARDIOVASCULAR: No chest pain, orthopnea, edema.  GASTROINTESTINAL: No nausea, vomiting, diarrhea or abdominal pain.  GENITOURINARY: No dysuria, hematuria.  ENDOCRINE: No polyuria, nocturia,  HEMATOLOGY: No anemia, easy bruising or bleeding SKIN: No rash or lesion. MUSCULOSKELETAL: No joint pain or arthritis.   NEUROLOGIC: No tingling, numbness, weakness.  PSYCHIATRY: No anxiety or depression.   MEDICATIONS AT HOME:  Prior to Admission medications   Medication Sig  Start Date End Date Taking? Authorizing Provider  acetaminophen (TYLENOL) 500 MG tablet Take 2 tablets (1,000 mg total) by mouth every 8 (eight) hours as needed. 08/18/17   Rubie Maid, MD  clopidogrel (PLAVIX) 75 MG tablet Take 75 mg by mouth daily.    [provider]  Coenzyme Q10 (CO Q 10) 100 MG CAPS Take 100 mg by mouth daily. 04/25/17   [name]., MD  [facility] ASPIRIN ADULT LOW DOSE 81 MG chewable tablet CHEW 1 TABLET (81 MG TOTAL) BY MOUTH DAILY. 11/04/17   [name]., MD  cyproheptadine (PERIACTIN) 4 MG tablet Take 4 mg by mouth 3 (three) times daily as needed for allergies.    [provider]  furosemide (LASIX) 20 MG tablet Take 20 mg by mouth daily.    [provider]  HYDROcodone-acetaminophen (NORCO/VICODIN) 5-325 MG tablet Take one tablet at night for pain; may take  up to every 6 hours as needed for pain if not working or driving 86/7/61   [provider]  isosorbide mononitrate (IMDUR) 30 MG 24 hr tablet Take 2 tablets (60 mg total) by mouth daily. 11/04/16   Plonk, Gwyndolyn Saxon, MD  loperamide (IMODIUM) 2 MG capsule Take by mouth as needed for diarrhea or loose stools.    [provider]  metFORMIN (GLUCOPHAGE) 1000 MG tablet Take 1 tablet (1,000 mg total) by mouth 2 (two) times daily with a meal. 02/02/18   [name]., MD  metoprolol tartrate (LOPRESSOR) 50 MG tablet TAKE 1 TABLET BY MOUTH TWICE A DAY FOR HIGH BLOOD PRESSURE 02/24/17   [name]., MD  nitroGLYCERIN (NITROSTAT) 0.4 MG SL tablet Place 1 tablet (0.4 mg total) under the tongue every 5 (five) minutes as needed for chest pain. 07/27/16   Bettey Costa, MD  omeprazole (PRILOSEC) 20 MG capsule TAKE 1 CAPSULE BY MOUTH EVERY DAY 12/29/17   [name]., MD  ondansetron (ZOFRAN-ODT) 8 MG disintegrating tablet DISSOLVE 1 TABLET ON THE TONGUE EVERY 8 HOURS AS NEEDED FOR NAUSEA OR VOMITING 11/04/17   Birdie Sons, MD  rosuvastatin (CRESTOR) 10 MG  tablet TAKE 1 TABLET BY MOUTH EVERY DAY 03/08/18   [name]., MD  sucralfate (CARAFATE) 1 g tablet Take 1 tablet (1 g total) by mouth 2 (two) times daily. 10/06/17   [name]., MD  tiZANidine (ZANAFLEX) 2 MG tablet Take 2 mg by mouth at bedtime.     [provider]      PHYSICAL EXAMINATION:   VITAL SIGNS: Blood pressure (!) 166/86, pulse (!) 118, temperature 98.8 F (37.1 C), temperature source Oral, resp. rate 17, height 5\' 2"  (1.575 m), weight 77.1 kg, SpO2 94 %.  GENERAL:  67 y.o.-year-old patient lying in the bed with no acute distress.  EYES: Pupils equal, round, reactive to light and accommodation. No scleral icterus. Extraocular muscles intact.  HEENT: Head atraumatic, normocephalic. Oropharynx and nasopharynx clear.  NECK:  Supple, no jugular venous distention. No thyroid enlargement, no tenderness.  LUNGS: Bilateral wheezing throughout both lungs CARDIOVASCULAR: S1, S2 tachycardia no murmurs, rubs, or gallops.  ABDOMEN: Soft, nontender, nondistended. Bowel sounds present. No organomegaly or mass.  EXTREMITIES: No pedal edema, cyanosis, or clubbing.  NEUROLOGIC: Cranial nerves II through XII are intact. Muscle strength 5/5 in all extremities. Sensation intact. Gait not checked.  PSYCHIATRIC: The patient is alert and oriented x 3.  SKIN: No obvious rash, lesion, or ulcer.   LABORATORY PANEL:   CBC Recent Labs  Lab 03/27/18 0919  WBC 6.8  HGB 13.0  HCT 41.1  PLT 210  MCV 93.6  MCH 29.6  MCHC 31.6  RDW 13.2  LYMPHSABS 2.0  MONOABS 0.6  EOSABS 0.2  BASOSABS 0.1   ------------------------------------------------------------------------------------------------------------------  Chemistries  Recent Labs  Lab 03/27/18 0919  NA 139  K 3.5  CL 108  CO2 22  GLUCOSE 224*  BUN 6*  CREATININE 0.62  CALCIUM 9.2  AST 21  ALT 13  ALKPHOS 122  BILITOT 0.6    ------------------------------------------------------------------------------------------------------------------ estimated creatinine clearance is 66.5 mL/min (by C-G formula based on SCr of 0.62 mg/dL). ------------------------------------------------------------------------------------------------------------------ No results for input(s): TSH, T4TOTAL, T3FREE, THYROIDAB in the last 72 hours.  Invalid input(s): FREET3   Coagulation profile No results for input(s): INR, PROTIME in the last 168 hours. ------------------------------------------------------------------------------------------------------------------- No results for input(s): DDIMER in the last 72 hours. -------------------------------------------------------------------------------------------------------------------  Cardiac  Enzymes Recent Labs  Lab 03/27/18 0919  TROPONINI <0.03   ------------------------------------------------------------------------------------------------------------------ Invalid input(s): POCBNP  ---------------------------------------------------------------------------------------------------------------  Urinalysis    Component Value Date/Time   COLORURINE YELLOW (A) 08/15/2017 2029   APPEARANCEUR CLEAR (A) 08/15/2017 2029   APPEARANCEUR Clear 12/24/2014 0905   LABSPEC 1.020 08/15/2017 2029   LABSPEC 1.030 09/14/2013 2123   PHURINE 6.0 08/15/2017 2029   GLUCOSEU >=500 (A) 08/15/2017 2029   GLUCOSEU Negative 09/14/2013 2123   HGBUR LARGE (A) 08/15/2017 2029   BILIRUBINUR neg 09/08/2017 1522   BILIRUBINUR Negative 12/24/2014 0905   BILIRUBINUR Negative 09/14/2013 2123   KETONESUR 20 (A) 08/15/2017 2029   PROTEINUR Positive (A) 09/08/2017 1522   PROTEINUR 30 (A) 08/15/2017 2029   UROBILINOGEN 0.2 09/08/2017 1522   NITRITE neg 09/08/2017 1522   NITRITE NEGATIVE 08/15/2017 2029   LEUKOCYTESUR Moderate (2+) (A) 09/08/2017 1522   LEUKOCYTESUR Negative 12/24/2014 0905    LEUKOCYTESUR 3+ 09/14/2013 2123     RADIOLOGY: Dg Chest 2 View  Result Date: 03/27/2018 CLINICAL DATA:  67 year old female with a history of cough EXAM: CHEST - 2 VIEW COMPARISON:  CT 11/09/2017, chest x-ray 10/29/2017 FINDINGS: Cardiomediastinal silhouette unchanged in size and contour with surgical changes of prior median sternotomy and CABG. No pneumothorax. No pleural effusion. No confluent airspace disease. Coarsened interstitial markings similar to the comparison. No acute displaced fracture. Degenerative changes of the shoulders. IMPRESSION: Chronic changes without evidence of acute cardiopulmonary disease. Surgical changes of prior median sternotomy and CABG Electronically Signed   By: Corrie Mckusick D.O.   On: 03/27/2018 09:37   Ct Angio Chest Pe W And/or Wo Contrast  Result Date: 03/27/2018 CLINICAL DATA:  Cough, congestion and chest pain and shortness of breath for 5 days. EXAM: CT ANGIOGRAPHY CHEST WITH CONTRAST TECHNIQUE: Multidetector CT imaging of the chest was performed using the standard protocol during bolus administration of intravenous contrast. Multiplanar CT image reconstructions and MIPs were obtained to evaluate the vascular anatomy. CONTRAST:  67mL OMNIPAQUE IOHEXOL 350 MG/ML SOLN COMPARISON:  CT scan 11/09/2017 FINDINGS: Cardiovascular: The heart is normal in size. No pericardial effusion. Stable surgical changes from coronary artery bypass surgery. Stable coronary artery calcifications. The aorta is normal in caliber. No dissection. Moderate atherosclerotic calcifications. Advanced atherosclerotic calcifications at the origin of the left subclavian artery. The pulmonary arterial tree is well opacified. No filling defects to suggest pulmonary embolism. Mediastinum/Nodes: Small scattered mediastinal and hilar lymph nodes are stable. The esophagus is grossly normal. Small stable hiatal hernia. Lungs/Pleura: No acute pulmonary findings. No infiltrates, edema or effusions. No  worrisome pulmonary lesions. Stable areas of subpleural scarring change. Upper Abdomen: No significant findings. Musculoskeletal: No breast masses, supraclavicular or axillary lymphadenopathy. The thyroid gland is unremarkable The bony thorax is intact. No destructive bone lesions or acute bony abnormality. Review of the MIP images confirms the above findings. IMPRESSION: 1. No CT findings for pulmonary embolism. 2. Mild tortuosity and mild calcification involving the thoracic aorta but no dissection. Dense calcifications noted at the origin of the left subclavian artery. 3. No acute pulmonary findings or worrisome pulmonary lesions. 4. Stable scattered mediastinal and hilar lymph nodes. 5. Small hiatal hernia. 6. No significant upper abdominal findings. Aortic Atherosclerosis (ICD10-I70.0). Electronically Signed   By: Marijo Sanes M.D.   On: 03/27/2018 12:22    EKG: Orders placed or performed during the hospital encounter of 03/27/18  . ED EKG  . ED EKG    IMPRESSION AND PLAN: Patient is a 67 year old presenting with shortness  of breath and cough  1.  Shortness of breath and cough likely due to acute bronchitis with acute bronchospasm We will treat with Xopenex nebs due to tachycardia, Solu-Medrol and Pulmicort  2.  Sinus tachycardia I will adjust her metoprolol, use IV Cardizem as needed  3.  Coronary artery disease continue Plavix, Imdur and metoprolol  4.  Hyperlipidemia continue Crestor  5.  Diabetes type 2 we will continue metformin Place on sliding scale insulin  6.  Miscellaneous Lovenox for DVT prophylaxis    All the records are reviewed and case discussed with ED provider. Management plans discussed with the patient, family and they are in agreement.  CODE STATUS: Code Status History    Date Active Date Inactive Code Status Order ID Comments User Context   08/15/2017 2104 08/18/2017 1715 Full Code [id number]  Rubie Maid, MD Inpatient   12/23/2016 1722 12/24/2016 1639 Full  Code [id number]  Gladstone Lighter, MD Inpatient   08/03/2016 2306 08/06/2016 1247 Full Code [id number]  Lance Coon, MD ED   07/26/2016 1639 07/27/2016 1811 Full Code [id number]  Demetrios Loll, MD Inpatient   11/07/2015 1711 11/10/2015 1552 Full Code [id number]  John Giovanni, PA-C Inpatient   11/05/2015 2155 11/07/2015 1646 Full Code [id number]  Henreitta Leber, MD Inpatient       TOTAL TIME TAKING CARE OF THIS PATIENT: 55 minutes.    Dustin Flock M.D on 03/27/2018 at 2:24 PM  Between 7am to 6pm - Pager - [phone]  After 6pm go to [email or web address] - password [email or web address] [facility]  [phone]  CC: Primary care physician; [name]., MD

## 2018-03-27 NOTE — ED Notes (Signed)
First Nurse Note: Patient in Triage 2, to radiology via [facility] and then to Rm 5.  Elmo Putt RN aware of patient movement.

## 2018-03-27 NOTE — ED Notes (Signed)
Heading to xray.

## 2018-03-27 NOTE — ED Triage Notes (Signed)
Pt here with c/o cough since last Thursday. Coughing in triage, states pain in chest with cough, NAD at this time, is shob, cough is tight sounding.

## 2018-03-27 NOTE — ED Notes (Signed)
2 unsuccessful IV attempts by this RN. Marya Amsler, RN at bedside to attempt access.

## 2018-03-27 NOTE — ED Notes (Signed)
Admitting MD at bedside.

## 2018-03-28 LAB — HEMOGLOBIN A1C
Hgb A1c MFr Bld: 9.1 % — ABNORMAL HIGH (ref 4.8–5.6)
Mean Plasma Glucose: 214.47 mg/dL

## 2018-03-28 LAB — CBC
HCT: 34.2 % — ABNORMAL LOW (ref 36.0–46.0)
Hemoglobin: 11 g/dL — ABNORMAL LOW (ref 12.0–15.0)
MCH: 29.9 pg (ref 26.0–34.0)
MCHC: 32.2 g/dL (ref 30.0–36.0)
MCV: 92.9 fL (ref 80.0–100.0)
Platelets: 214 10*3/uL (ref 150–400)
RBC: 3.68 MIL/uL — ABNORMAL LOW (ref 3.87–5.11)
RDW: 13.3 % (ref 11.5–15.5)
WBC: 7.8 10*3/uL (ref 4.0–10.5)
nRBC: 0 % (ref 0.0–0.2)

## 2018-03-28 LAB — BASIC METABOLIC PANEL
Anion gap: 9 (ref 5–15)
BUN: 12 mg/dL (ref 8–23)
CO2: 21 mmol/L — ABNORMAL LOW (ref 22–32)
Calcium: 8.7 mg/dL — ABNORMAL LOW (ref 8.9–10.3)
Chloride: 108 mmol/L (ref 98–111)
Creatinine, Ser: 0.81 mg/dL (ref 0.44–1.00)
GFR calc Af Amer: >60 mL/min (ref >60)
GFR calc non Af Amer: >60 mL/min (ref >60)
Glucose, Bld: 309 mg/dL — ABNORMAL HIGH (ref 70–99)
Potassium: 3.7 mmol/L (ref 3.5–5.1)
Sodium: 138 mmol/L (ref 135–145)

## 2018-03-28 LAB — GLUCOSE, CAPILLARY
Glucose-Capillary: 224 mg/dL — ABNORMAL HIGH (ref 70–99)
Glucose-Capillary: 221 mg/dL — ABNORMAL HIGH (ref 70–99)

## 2018-03-28 MED ORDER — INSULIN GLARGINE 100 UNIT/ML ~~LOC~~ SOLN
15.0000 [IU] | Freq: Every day | SUBCUTANEOUS | Status: DC
  Filled 2018-03-28: qty 0.15

## 2018-03-28 MED ORDER — CEPASTAT 14.5 MG MT LOZG
1.0000 | LOZENGE | OROMUCOSAL | Status: DC | PRN
  Filled 2018-03-28: qty 9

## 2018-03-28 MED ORDER — MENTHOL 3 MG MT LOZG
1.0000 | LOZENGE | OROMUCOSAL | Status: DC | PRN
  Filled 2018-03-28 (×2): qty 9

## 2018-03-28 MED ORDER — SODIUM CHLORIDE 0.9 % IV BOLUS
1000.0000 mL | Freq: Once | INTRAVENOUS | Status: AC
  Administered 2018-03-28: 1000 mL via INTRAVENOUS

## 2018-03-28 MED ORDER — CEPASTAT 14.5 MG MT LOZG: 1.0000 | LOZENGE | OROMUCOSAL | 0 refills | Status: DC | PRN

## 2018-03-28 MED ORDER — INSULIN ASPART 100 UNIT/ML ~~LOC~~ SOLN
0.0000 [IU] | Freq: Three times a day (TID) | SUBCUTANEOUS | Status: DC
  Administered 2018-03-28 (×2): 5 [IU] via SUBCUTANEOUS
  Filled 2018-03-28 (×2): qty 1

## 2018-03-28 MED ORDER — BENZONATATE 100 MG PO CAPS: 100.0000 mg | ORAL_CAPSULE | Freq: Three times a day (TID) | ORAL | 0 refills | Status: DC | PRN

## 2018-03-28 MED ORDER — INSULIN ASPART 100 UNIT/ML ~~LOC~~ SOLN: 0.0000 [IU] | Freq: Every day | SUBCUTANEOUS | Status: DC

## 2018-03-28 MED ORDER — METOPROLOL TARTRATE 50 MG PO TABS: 50.0000 mg | ORAL_TABLET | Freq: Two times a day (BID) | ORAL | Status: DC

## 2018-03-28 MED ORDER — BENZONATATE 100 MG PO CAPS: 100.0000 mg | ORAL_CAPSULE | Freq: Three times a day (TID) | ORAL | Status: DC | PRN

## 2018-03-28 MED ORDER — INSULIN GLARGINE 100 UNIT/ML ~~LOC~~ SOLN
8.0000 [IU] | Freq: Every day | SUBCUTANEOUS | Status: DC
  Administered 2018-03-28: 8 [IU] via SUBCUTANEOUS
  Filled 2018-03-28 (×2): qty 0.08

## 2018-03-28 NOTE — Progress Notes (Signed)
Candace [name] to be D/C'd Home per MD order. Patient given discharge teaching and paperwork regarding medications, diet, follow-up appointments and activity. Patient understanding verbalized. No questions or complaints at this time. Skin condition as charted. IV and telemetry removed prior to leaving.  No further needs by Care Management/Social Work. Prescriptions handed to patient.  An After Visit Summary was printed and given to the patient.   Patient escorted via wheelchair by volunteer to ride home with son.  [name]

## 2018-03-28 NOTE — Plan of Care (Signed)
  Problem: Clinical Measurements: Goal: Will remain free from infection Outcome: Progressing Goal: Diagnostic test results will improve Outcome: Progressing   

## 2018-03-28 NOTE — Progress Notes (Addendum)
Inpatient Diabetes Program Recommendations  AACE/ADA: New Consensus Statement on Inpatient Glycemic Control (2019)  Target Ranges:  Prepandial:   less than 140 mg/dL      Peak postprandial:   less than 180 mg/dL (1-2 hours)      Critically ill patients:  140 - 180 mg/dL   Results for Candace [name], Candace [name] (MRN [id number]) as of 03/28/2018 07:50  Ref. Range 03/27/2018 16:50 03/27/2018 21:30  Glucose-Capillary Latest Ref Range: 70 - 99 mg/dL 393 (H) 350 (H)  Results for Candace [name], Candace [name] (MRN [id number]) as of 03/28/2018 07:50  Ref. Range 03/27/2018 09:19  Glucose Latest Ref Range: 70 - 99 mg/dL 224 (H)   Review of Glycemic Control  Diabetes history: DM2 Outpatient Diabetes medications: Metformin 1000 mg BID Current orders for Inpatient glycemic control: Novolog 0-9 units ACHS, Metformin 1000 mg BID  Inpatient Diabetes Program Recommendations: Insulin-Correction: Please consider increasing Novolog correction to moderate scale 0-15 units ACHS..  Insulin - Basal: Noted patient received one time dose of Solumedrol 125 mg on 03/27/18 which is contributing to hyperglycemia. Please consider ordering Lantus 8 units x1 now. Will continue to follow while inpatient and assess glycemic trends to determine if basal insulin needs to be ordered.  Addendum 03/28/18@9 :50-Spoke with patient regarding DM control. Patient states that she is followed by her PCP for DM control and she takes Metformin 1000 mg BID. Patient states that she checks her glucose once a day (fasting) and it ranges from 120-200 mg/dl.  Informed patient that she received Solumedrol on 03/27/18 which is likely cause of hyperglycemia. Discussed last A1C of 8.2% indicating an average glucose of 189 mg/dl.  Patient reports that her A1C is usually in the mid 7 to 8%.  Discussed glucose and A1C goals. Discussed importance of checking CBGs and maintaining good CBG control to prevent long-term and short-term complications. Encouraged patient to continuing  checking glucose at least 1 time per day at home and asked that she keep a record of glucose values and be sure to take them with her to her next appointment with PCP. Explained that she may need additional DM medication prescribed if glucose is consistently above target glucose and A1C is consistently elevated.   Patient verbalized understanding of information discussed and she states that she has no further questions at this time related to diabetes.  Thanks, Barnie Alderman, RN, MSN, CDE Diabetes Coordinator Inpatient Diabetes Program [phone] (Team Pager from 8am to 5pm)

## 2018-03-28 NOTE — Care Management Obs Status (Signed)
[place] NOTIFICATION   Patient Details  Name: Candace [name] MRN: [id number] Date of Birth: Jan 27, 1951   Medicare Observation Status Notification Given:  Yes    Elza Rafter, RN 03/28/2018, 3:22 PM

## 2018-03-28 NOTE — Discharge Summary (Signed)
[facility] [place] NAME: Candace [name]    MR#:  [id number]  DATE OF BIRTH:  1950-11-05  DATE OF ADMISSION:  03/27/2018   ADMITTING PHYSICIAN: Dustin Flock, MD  DATE OF DISCHARGE: 03/28/2018  PRIMARY CARE PHYSICIAN: [name]., MD   ADMISSION DIAGNOSIS:  Tachycardia [R00.0] Bronchitis [J40] DISCHARGE DIAGNOSIS:  Active Problems:   Tachycardia  SECONDARY DIAGNOSIS:   Past Medical History:  Diagnosis Date  . Allergy   . Anemia   . Anxiety   . Arrhythmia   . Arthritis   . Coronary artery disease   . Depression   . Diabetes mellitus without complication ([place])   . Dyspnea    doe  . Dysrhythmia   . GERD (gastroesophageal reflux disease)   . Headache   . History of hiatal hernia   . Hyperlipidemia   . Hypertension   . Myocardial infarction ([place])    2016, 04/2017  . Myocardial infarction with cardiac rehabilitation [facility])    MI 2016/ CABG 8/17    FINISHED CARDIAC REHAB 3 WEEKS AGO  . Panic attack   . Pneumonia   . Reflux   . Stroke [facility]) 2015   showed up on MRI; no weakness noted  . TIA (transient ischemic attack)   . Voice tremor    HOSPITAL COURSE:  Patient is a 67 year old presenting with shortness of breath and cough  1.  Shortness of breath and cough likely due to acute bronchitis with acute bronchospasm She is treated with Xopenex nebs due to tachycardia, Solu-Medrol one dose and Pulmicort. Cepastat and tessalon prn. CT angiogram of the chest did not show any PE or pneumonia.  2.  Sinus tachycardia, improved, continue home metoprolol.  3.  Coronary artery disease continue Plavix, Imdur and metoprolol  4.  Hyperlipidemia continue Crestor  5.  Diabetes type 2,  continue metformin. Hyperglycemia due to diabetes 2 and steroid.  The patient was given Lantus and sliding scale.  Blood sugars better controlled. DISCHARGE CONDITIONS:  Stable, discharge to home today. CONSULTS OBTAINED:   DRUG  ALLERGIES:   Allergies  Allergen Reactions  . Lisinopril     cough  . Penicillins Swelling, Rash and Other (See Comments)    Has patient had a PCN reaction causing immediate rash, facial/tongue/throat swelling, SOB or lightheadedness with hypotension: Yes Has patient had a PCN reaction causing severe rash involving mucus membranes or skin necrosis: Yes Has patient had a PCN reaction that required hospitalization No Has patient had a PCN reaction occurring within the last 10 years: Yes If all of the above answers are "NO", then may proceed with Cephalosporin use.    DISCHARGE MEDICATIONS:   Allergies as of 03/28/2018      Reactions   Lisinopril    cough   Penicillins Swelling, Rash, Other (See Comments)   Has patient had a PCN reaction causing immediate rash, facial/tongue/throat swelling, SOB or lightheadedness with hypotension: Yes Has patient had a PCN reaction causing severe rash involving mucus membranes or skin necrosis: Yes Has patient had a PCN reaction that required hospitalization No Has patient had a PCN reaction occurring within the last 10 years: Yes If all of the above answers are "NO", then may proceed with Cephalosporin use.      Medication List    TAKE these medications   acetaminophen 500 MG tablet Commonly known as:  TYLENOL Take 2 tablets (1,000 mg total) by mouth every 8 (eight) hours as  needed.   benzonatate 100 MG capsule Commonly known as:  TESSALON Take 1 capsule (100 mg total) by mouth 3 (three) times daily as needed for cough.   clopidogrel 75 MG tablet Commonly known as:  PLAVIX Take 75 mg by mouth daily.   Co Q 10 100 MG Caps Take 100 mg by mouth daily.   [employer] ASPIRIN ADULT LOW DOSE 81 MG chewable tablet Generic drug:  aspirin CHEW 1 TABLET (81 MG TOTAL) BY MOUTH DAILY. What changed:  See the new instructions.   cyproheptadine 4 MG tablet Commonly known as:  PERIACTIN Take 4 mg by mouth 3 (three) times daily as needed for allergies.     furosemide 20 MG tablet Commonly known as:  LASIX Take 20 mg by mouth daily.   isosorbide mononitrate 30 MG 24 hr tablet Commonly known as:  IMDUR Take 2 tablets (60 mg total) by mouth daily.   loperamide 2 MG capsule Commonly known as:  IMODIUM Take by mouth as needed for diarrhea or loose stools.   metFORMIN 1000 MG tablet Commonly known as:  GLUCOPHAGE Take 1 tablet (1,000 mg total) by mouth 2 (two) times daily with a meal.   metoprolol tartrate 50 MG tablet Commonly known as:  LOPRESSOR TAKE 1 TABLET BY MOUTH TWICE A DAY FOR HIGH BLOOD PRESSURE What changed:  See the new instructions.   nitroGLYCERIN 0.4 MG SL tablet Commonly known as:  NITROSTAT Place 1 tablet (0.4 mg total) under the tongue every 5 (five) minutes as needed for chest pain.   ondansetron 8 MG disintegrating tablet Commonly known as:  ZOFRAN-ODT DISSOLVE 1 TABLET ON THE TONGUE EVERY 8 HOURS AS NEEDED FOR NAUSEA OR VOMITING What changed:  See the new instructions.   phenol-menthol 14.5 MG lozenge Place 1 lozenge inside cheek as needed for sore throat.   rosuvastatin 10 MG tablet Commonly known as:  CRESTOR TAKE 1 TABLET BY MOUTH EVERY DAY   sucralfate 1 g tablet Commonly known as:  CARAFATE Take 1 tablet (1 g total) by mouth 2 (two) times daily.   tiZANidine 2 MG tablet Commonly known as:  ZANAFLEX Take 2 mg by mouth at bedtime.        DISCHARGE INSTRUCTIONS:  See AVS. If you experience worsening of your admission symptoms, develop shortness of breath, life threatening emergency, suicidal or homicidal thoughts you must seek medical attention immediately by calling 911 or calling your MD immediately  if symptoms less severe.  You Must read complete instructions/literature along with all the possible adverse reactions/side effects for all the Medicines you take and that have been prescribed to you. Take any new Medicines after you have completely understood and accpet all the possible adverse  reactions/side effects.   Please note  You were cared for by a hospitalist during your hospital stay. If you have any questions about your discharge medications or the care you received while you were in the hospital after you are discharged, you can call the unit and asked to speak with the hospitalist on call if the hospitalist that took care of you is not available. Once you are discharged, your primary care physician will handle any further medical issues. Please note that NO REFILLS for any discharge medications will be authorized once you are discharged, as it is imperative that you return to your primary care physician (or establish a relationship with a primary care physician if you do not have one) for your aftercare needs so that they can reassess your  need for medications and monitor your lab values.    On the day of Discharge:  VITAL SIGNS:  Blood pressure (!) 99/55, pulse 88, temperature 97.8 F (36.6 C), temperature source Oral, resp. rate 18, height 5\' 2"  (1.575 m), weight 78.2 kg, SpO2 95 %. PHYSICAL EXAMINATION:  GENERAL:  67 y.o.-year-old patient lying in the bed with no acute distress.  EYES: Pupils equal, round, reactive to light and accommodation. No scleral icterus. Extraocular muscles intact.  HEENT: Head atraumatic, normocephalic. Oropharynx and nasopharynx clear.  NECK:  Supple, no jugular venous distention. No thyroid enlargement, no tenderness.  LUNGS: Normal breath sounds bilaterally, no wheezing, rales,rhonchi or crepitation. No use of accessory muscles of respiration.  CARDIOVASCULAR: S1, S2 normal. No murmurs, rubs, or gallops.  ABDOMEN: Soft, non-tender, non-distended. Bowel sounds present. No organomegaly or mass.  EXTREMITIES: No pedal edema, cyanosis, or clubbing.  NEUROLOGIC: Cranial nerves II through XII are intact. Muscle strength 5/5 in all extremities. Sensation intact. Gait not checked.  PSYCHIATRIC: The patient is alert and oriented x 3.  SKIN: No  obvious rash, lesion, or ulcer.  DATA REVIEW:   CBC Recent Labs  Lab 03/28/18 0311  WBC 7.8  HGB 11.0*  HCT 34.2*  PLT 214    Chemistries  Recent Labs  Lab 03/27/18 0919 03/28/18 0311  NA 139 138  K 3.5 3.7  CL 108 108  CO2 22 21*  GLUCOSE 224* 309*  BUN 6* 12  CREATININE 0.62 0.81  CALCIUM 9.2 8.7*  AST 21  --   ALT 13  --   ALKPHOS 122  --   BILITOT 0.6  --      Microbiology Results  Results for orders placed or performed in visit on 03/24/17  Urine Culture     Status: None   Collection Time: 03/24/17  5:05 PM  Result Value Ref Range Status   MICRO NUMBER: [id number]  Final   SPECIMEN QUALITY: ADEQUATE  Final   Sample Source URINE  Final   STATUS: FINAL  Final   Result: No Growth  Final    RADIOLOGY:  Ct Angio Chest Pe W And/or Wo Contrast  Result Date: 03/27/2018 CLINICAL DATA:  Cough, congestion and chest pain and shortness of breath for 5 days. EXAM: CT ANGIOGRAPHY CHEST WITH CONTRAST TECHNIQUE: Multidetector CT imaging of the chest was performed using the standard protocol during bolus administration of intravenous contrast. Multiplanar CT image reconstructions and MIPs were obtained to evaluate the vascular anatomy. CONTRAST:  26mL OMNIPAQUE IOHEXOL 350 MG/ML SOLN COMPARISON:  CT scan 11/09/2017 FINDINGS: Cardiovascular: The heart is normal in size. No pericardial effusion. Stable surgical changes from coronary artery bypass surgery. Stable coronary artery calcifications. The aorta is normal in caliber. No dissection. Moderate atherosclerotic calcifications. Advanced atherosclerotic calcifications at the origin of the left subclavian artery. The pulmonary arterial tree is well opacified. No filling defects to suggest pulmonary embolism. Mediastinum/Nodes: Small scattered mediastinal and hilar lymph nodes are stable. The esophagus is grossly normal. Small stable hiatal hernia. Lungs/Pleura: No acute pulmonary findings. No infiltrates, edema or effusions. No  worrisome pulmonary lesions. Stable areas of subpleural scarring change. Upper Abdomen: No significant findings. Musculoskeletal: No breast masses, supraclavicular or axillary lymphadenopathy. The thyroid gland is unremarkable The bony thorax is intact. No destructive bone lesions or acute bony abnormality. Review of the MIP images confirms the above findings. IMPRESSION: 1. No CT findings for pulmonary embolism. 2. Mild tortuosity and mild calcification involving the thoracic aorta but no dissection. Dense  calcifications noted at the origin of the left subclavian artery. 3. No acute pulmonary findings or worrisome pulmonary lesions. 4. Stable scattered mediastinal and hilar lymph nodes. 5. Small hiatal hernia. 6. No significant upper abdominal findings. Aortic Atherosclerosis (ICD10-I70.0). Electronically Signed   By: Marijo Sanes M.D.   On: 03/27/2018 12:22     Management plans discussed with the patient, family and they are in agreement.  CODE STATUS: Full Code   TOTAL TIME TAKING CARE OF THIS PATIENT: 26 minutes.    Demetrios Loll M.D on 03/28/2018 at 11:11 AM  Between 7am to 6pm - Pager - [phone]  After 6pm go to [email or web address] - password [email or web address] [facility]  [employer] [place] [facility]  Office  [phone]  CC: Primary care physician; [name]., MD   Note: This dictation was prepared with Dragon dictation along with smaller phrase technology. Any transcriptional errors that result from this process are unintentional.

## 2018-04-05 ENCOUNTER — Encounter: Payer: Self-pay | Admitting: Family Medicine

## 2018-04-05 ENCOUNTER — Ambulatory Visit: Payer: Medicaid Other | Admitting: Family Medicine

## 2018-04-05 VITALS — BP 136/84 | HR 80 | Temp 98.3°F | Resp 16 | Wt 171.0 lb

## 2018-04-05 DIAGNOSIS — I25708 Atherosclerosis of coronary artery bypass graft(s), unspecified, with other forms of angina pectoris: Secondary | ICD-10-CM

## 2018-04-05 DIAGNOSIS — I10 Essential (primary) hypertension: Secondary | ICD-10-CM | POA: Diagnosis not present

## 2018-04-05 DIAGNOSIS — J440 Chronic obstructive pulmonary disease with acute lower respiratory infection: Secondary | ICD-10-CM | POA: Diagnosis not present

## 2018-04-05 DIAGNOSIS — F418 Other specified anxiety disorders: Secondary | ICD-10-CM

## 2018-04-05 DIAGNOSIS — J209 Acute bronchitis, unspecified: Secondary | ICD-10-CM

## 2018-04-05 NOTE — Progress Notes (Signed)
Patient: Candace [name] Female    DOB: Mar 29, 1951   68 y.o.   MRN: [id number] Visit Date: 04/05/2018  Today's Provider: [name], MD   Chief Complaint  Patient presents with  . ER follow up   Subjective:     HPI   Follow Up ER Visit  Patient is here for ER follow up.  She was recently seen at [facility] for acute bronchitis and tachycardia  on 03/27/2018. Treatment for this included Xopenex nebulizer, Solu-Medrol, and Pulmicort.   She reports good compliance with treatment. She reports this condition is Improved.     Patient reports that she is feeling much better.  She has a little bit of fatigue but is otherwise back to normal.   Allergies  Allergen Reactions  . Lisinopril     cough  . Penicillins Swelling, Rash and Other (See Comments)    Has patient had a PCN reaction causing immediate rash, facial/tongue/throat swelling, SOB or lightheadedness with hypotension: Yes Has patient had a PCN reaction causing severe rash involving mucus membranes or skin necrosis: Yes Has patient had a PCN reaction that required hospitalization No Has patient had a PCN reaction occurring within the last 10 years: Yes If all of the above answers are "NO", then may proceed with Cephalosporin use.      Current Outpatient Medications:  .  acetaminophen (TYLENOL) 500 MG tablet, Take 2 tablets (1,000 mg total) by mouth every 8 (eight) hours as needed., Disp: 60 tablet, Rfl: 1 .  benzonatate (TESSALON) 100 MG capsule, Take 1 capsule (100 mg total) by mouth 3 (three) times daily as needed for cough., Disp: 20 capsule, Rfl: 0 .  clopidogrel (PLAVIX) 75 MG tablet, Take 75 mg by mouth daily., Disp: , Rfl:  .  Coenzyme Q10 (CO Q 10) 100 MG CAPS, Take 100 mg by mouth daily., Disp: 30 capsule, Rfl: 12 .  [employer] ASPIRIN ADULT LOW DOSE 81 MG chewable tablet, CHEW 1 TABLET (81 MG TOTAL) BY MOUTH DAILY. (Patient taking differently: Chew 81 mg by mouth daily. ), Disp: 90 tablet, Rfl: 3 .   cyproheptadine (PERIACTIN) 4 MG tablet, Take 4 mg by mouth 3 (three) times daily as needed for allergies., Disp: , Rfl:  .  furosemide (LASIX) 20 MG tablet, Take 20 mg by mouth daily., Disp: , Rfl:  .  isosorbide mononitrate (IMDUR) 30 MG 24 hr tablet, Take 2 tablets (60 mg total) by mouth daily., Disp: 60 tablet, Rfl: 12 .  loperamide (IMODIUM) 2 MG capsule, Take by mouth as needed for diarrhea or loose stools., Disp: , Rfl:  .  metFORMIN (GLUCOPHAGE) 1000 MG tablet, Take 1 tablet (1,000 mg total) by mouth 2 (two) times daily with a meal., Disp: 180 tablet, Rfl: 3 .  metoprolol tartrate (LOPRESSOR) 50 MG tablet, TAKE 1 TABLET BY MOUTH TWICE A DAY FOR HIGH BLOOD PRESSURE (Patient taking differently: Take 50 mg by mouth 2 (two) times daily. ), Disp: 60 tablet, Rfl: 12 .  nitroGLYCERIN (NITROSTAT) 0.4 MG SL tablet, Place 1 tablet (0.4 mg total) under the tongue every 5 (five) minutes as needed for chest pain., Disp: 30 tablet, Rfl: 0 .  ondansetron (ZOFRAN-ODT) 8 MG disintegrating tablet, DISSOLVE 1 TABLET ON THE TONGUE EVERY 8 HOURS AS NEEDED FOR NAUSEA OR VOMITING (Patient taking differently: Take 8 mg by mouth every 8 (eight) hours as needed for nausea or vomiting. ), Disp: 30 tablet, Rfl: 1 .  phenol-menthol (CEPASTAT) 14.5 MG  lozenge, Place 1 lozenge inside cheek as needed for sore throat., Disp: 20 lozenge, Rfl: 0 .  rosuvastatin (CRESTOR) 10 MG tablet, TAKE 1 TABLET BY MOUTH EVERY DAY (Patient taking differently: Take 10 mg by mouth daily. ), Disp: 90 tablet, Rfl: 3 .  sucralfate (CARAFATE) 1 g tablet, Take 1 tablet (1 g total) by mouth 2 (two) times daily., Disp: 60 tablet, Rfl: 5 .  tiZANidine (ZANAFLEX) 2 MG tablet, Take 2 mg by mouth at bedtime. , Disp: , Rfl:   Review of Systems  Constitutional: Negative.   HENT: Negative.   Eyes: Negative.   Respiratory: Negative for cough and shortness of breath.   Cardiovascular: Negative for chest pain, palpitations and leg swelling.  Genitourinary:  Negative.   Musculoskeletal: Negative.   Skin: Negative.   Allergic/Immunologic: Negative.   Neurological: Negative for dizziness and light-headedness.  Psychiatric/Behavioral: Negative.     Social History   Tobacco Use  . Smoking status: Former Smoker    Packs/day: 0.50    Types: Cigarettes    Last attempt to quit: 10/07/2001    Years since quitting: 16.5  . Smokeless tobacco: Never Used  Substance Use Topics  . Alcohol use: No    Alcohol/week: 0.0 standard drinks      Objective:   BP 136/84 (BP Location: Left Arm, Patient Position: Sitting, Cuff Size: Normal)   Pulse 80   Temp 98.3 F (36.8 C)   Resp 16   Wt 171 lb (77.6 kg)   BMI 31.28 kg/m  Vitals:   04/05/18 0945  BP: 136/84  Pulse: 80  Resp: 16  Temp: 98.3 F (36.8 C)  Weight: 171 lb (77.6 kg)     Physical Exam Constitutional:      Appearance: She is well-developed.  HENT:     Head: Normocephalic and atraumatic.     Right Ear: External ear normal.     Left Ear: External ear normal.     Nose: Nose normal.     Mouth/Throat:     Pharynx: Oropharynx is clear.  Eyes:     General: No scleral icterus.    Conjunctiva/sclera: Conjunctivae normal.  Neck:     Thyroid: No thyromegaly.  Cardiovascular:     Rate and Rhythm: Normal rate and regular rhythm.     Heart sounds: Normal heart sounds.  Pulmonary:     Effort: Pulmonary effort is normal.  Abdominal:     Palpations: Abdomen is soft.  Musculoskeletal:        General: No swelling.  Skin:    General: Skin is warm and dry.  Neurological:     General: No focal deficit present.     Mental Status: She is alert and oriented to person, place, and time. Mental status is at baseline.  Psychiatric:        Behavior: Behavior normal.        Thought Content: Thought content normal.        Judgment: Judgment normal.         Assessment & Plan   1. Acute bronchitis with COPD ([place]) Resolved.  Clinically  patient is recovering nicely.  2. Coronary artery  disease of bypass graft of native heart with stable angina pectoris ([place]) All risk factors treated  3. Essential (primary) hypertension   4. Depression with anxiety Stable and much improved from last year.  I have done the exam and reviewed the chart and it is accurate to the best of my knowledge. Development worker, community has  been used and  any errors in dictation or transcription are unintentional. Miguel Aschoff M.D. [place], MD  [facility]

## 2018-04-20 ENCOUNTER — Telehealth: Payer: Self-pay

## 2018-04-20 NOTE — Telephone Encounter (Signed)
Pt calling in complaining of black tarry stools that started about two days ago.  She also stated that this morning she woke up with severe lower back and abdominal pain.  She denied taking Pepto Bismol or Iron supplements.  I advised her with the symptoms she was having she would need to go to the ER to be evaluated.  She agreed and stated she will get a ride.   Thanks,   -Mickel Baas

## 2018-04-24 ENCOUNTER — Telehealth: Payer: Self-pay | Admitting: Family Medicine

## 2018-04-24 ENCOUNTER — Emergency Department
Admission: EM | Admit: 2018-04-24 | Discharge: 2018-04-24 | Disposition: A | Discharge status: Home / Self Care | Payer: Medicare Other | Attending: Emergency Medicine | Admitting: Emergency Medicine

## 2018-04-24 ENCOUNTER — Encounter: Payer: Self-pay | Admitting: Emergency Medicine

## 2018-04-24 ENCOUNTER — Other Ambulatory Visit: Payer: Self-pay

## 2018-04-24 ENCOUNTER — Emergency Department: Payer: Medicare Other

## 2018-04-24 DIAGNOSIS — R1031 Right lower quadrant pain: Secondary | ICD-10-CM

## 2018-04-24 DIAGNOSIS — I252 Old myocardial infarction: Secondary | ICD-10-CM | POA: Diagnosis not present

## 2018-04-24 DIAGNOSIS — Z7902 Long term (current) use of antithrombotics/antiplatelets: Secondary | ICD-10-CM | POA: Diagnosis not present

## 2018-04-24 DIAGNOSIS — Z8673 Personal history of transient ischemic attack (TIA), and cerebral infarction without residual deficits: Secondary | ICD-10-CM | POA: Diagnosis not present

## 2018-04-24 DIAGNOSIS — Z87891 Personal history of nicotine dependence: Secondary | ICD-10-CM | POA: Diagnosis not present

## 2018-04-24 DIAGNOSIS — R109 Unspecified abdominal pain: Secondary | ICD-10-CM | POA: Diagnosis present

## 2018-04-24 DIAGNOSIS — Z951 Presence of aortocoronary bypass graft: Secondary | ICD-10-CM | POA: Diagnosis not present

## 2018-04-24 DIAGNOSIS — Z7982 Long term (current) use of aspirin: Secondary | ICD-10-CM | POA: Diagnosis not present

## 2018-04-24 DIAGNOSIS — I10 Essential (primary) hypertension: Secondary | ICD-10-CM | POA: Insufficient documentation

## 2018-04-24 DIAGNOSIS — I2510 Atherosclerotic heart disease of native coronary artery without angina pectoris: Secondary | ICD-10-CM | POA: Diagnosis not present

## 2018-04-24 DIAGNOSIS — J449 Chronic obstructive pulmonary disease, unspecified: Secondary | ICD-10-CM | POA: Diagnosis not present

## 2018-04-24 DIAGNOSIS — Z79899 Other long term (current) drug therapy: Secondary | ICD-10-CM | POA: Insufficient documentation

## 2018-04-24 DIAGNOSIS — N390 Urinary tract infection, site not specified: Secondary | ICD-10-CM

## 2018-04-24 DIAGNOSIS — E119 Type 2 diabetes mellitus without complications: Secondary | ICD-10-CM | POA: Insufficient documentation

## 2018-04-24 LAB — URINALYSIS, COMPLETE (UACMP) WITH MICROSCOPIC
Bilirubin Urine: NEGATIVE
Glucose, UA: NEGATIVE mg/dL
Ketones, ur: NEGATIVE mg/dL
Nitrite: NEGATIVE
Protein, ur: NEGATIVE mg/dL
Specific Gravity, Urine: 1.006 (ref 1.005–1.030)
WBC, UA: >50 WBC/hpf — ABNORMAL HIGH (ref 0–5)
pH: 7 (ref 5.0–8.0)

## 2018-04-24 LAB — CBC
HCT: 40.1 % (ref 36.0–46.0)
Hemoglobin: 12.7 g/dL (ref 12.0–15.0)
MCH: 30.4 pg (ref 26.0–34.0)
MCHC: 31.7 g/dL (ref 30.0–36.0)
MCV: 95.9 fL (ref 80.0–100.0)
Platelets: 227 10*3/uL (ref 150–400)
RBC: 4.18 MIL/uL (ref 3.87–5.11)
RDW: 12.7 % (ref 11.5–15.5)
WBC: 6.4 10*3/uL (ref 4.0–10.5)
nRBC: 0 % (ref 0.0–0.2)

## 2018-04-24 LAB — COMPREHENSIVE METABOLIC PANEL
ALT: 11 U/L (ref 0–44)
AST: 16 U/L (ref 15–41)
Albumin: 4.1 g/dL (ref 3.5–5.0)
Alkaline Phosphatase: 108 U/L (ref 38–126)
Anion gap: 7 (ref 5–15)
BUN: 14 mg/dL (ref 8–23)
CO2: 29 mmol/L (ref 22–32)
Calcium: 9.2 mg/dL (ref 8.9–10.3)
Chloride: 104 mmol/L (ref 98–111)
Creatinine, Ser: 0.59 mg/dL (ref 0.44–1.00)
GFR calc Af Amer: >60 mL/min (ref >60)
GFR calc non Af Amer: >60 mL/min (ref >60)
Glucose, Bld: 188 mg/dL — ABNORMAL HIGH (ref 70–99)
Potassium: 3.7 mmol/L (ref 3.5–5.1)
Sodium: 140 mmol/L (ref 135–145)
Total Bilirubin: 0.5 mg/dL (ref 0.3–1.2)
Total Protein: 7.4 g/dL (ref 6.5–8.1)

## 2018-04-24 LAB — LIPASE, BLOOD: Lipase: 35 U/L (ref 11–51)

## 2018-04-24 MED ORDER — ONDANSETRON HCL 4 MG/2ML IJ SOLN
4.0000 mg | Freq: Once | INTRAMUSCULAR | Status: AC
  Administered 2018-04-24: 4 mg via INTRAVENOUS
  Filled 2018-04-24: qty 2

## 2018-04-24 MED ORDER — SODIUM CHLORIDE 0.9% FLUSH: 3.0000 mL | Freq: Once | INTRAVENOUS | Status: DC

## 2018-04-24 MED ORDER — SODIUM CHLORIDE 0.9 % IV SOLN
1000.0000 mL | Freq: Once | INTRAVENOUS | Status: AC
  Administered 2018-04-24: 1000 mL via INTRAVENOUS

## 2018-04-24 MED ORDER — KETOROLAC TROMETHAMINE 30 MG/ML IJ SOLN
15.0000 mg | Freq: Once | INTRAMUSCULAR | Status: AC
  Administered 2018-04-24: 15 mg via INTRAVENOUS
  Filled 2018-04-24: qty 1

## 2018-04-24 MED ORDER — MORPHINE SULFATE (PF) 4 MG/ML IV SOLN
4.0000 mg | Freq: Once | INTRAVENOUS | Status: AC
  Administered 2018-04-24: 4 mg via INTRAVENOUS
  Filled 2018-04-24: qty 1

## 2018-04-24 MED ORDER — TRAMADOL HCL 50 MG PO TABS: 50.0000 mg | ORAL_TABLET | Freq: Four times a day (QID) | ORAL | 0 refills | Status: DC | PRN

## 2018-04-24 MED ORDER — CEPHALEXIN 500 MG PO CAPS: 500.0000 mg | ORAL_CAPSULE | Freq: Two times a day (BID) | ORAL | 0 refills | Status: DC

## 2018-04-24 NOTE — ED Notes (Signed)
Sent [facility] to lab

## 2018-04-24 NOTE — ED Notes (Signed)
Patient transported to CT 

## 2018-04-24 NOTE — ED Provider Notes (Signed)
[facility] Emergency Department Provider Note   ____________________________________________    I have reviewed the triage vital signs and the nursing notes.   HISTORY  Chief Complaint Abdominal Pain     HPI Candace [name] is a 68 y.o. female who presents with complaints of abdominal pain.  Patient describes right flank pain radiating to her right groin over the last several days.  She reports at times the pain is severe and causes her to be nauseated.  She denies a history of kidney stones.  She denies dysuria, has not noticed any hematuria.  She does report over the last several days she feels like her stool has been black.  She reports taking Plavix and aspirin.  No history of GI bleed in the past.  Has not taken anything for this except some Imodium.  Past Medical History:  Diagnosis Date  . Allergy   . Anemia   . Anxiety   . Arrhythmia   . Arthritis   . Coronary artery disease   . Depression   . Diabetes mellitus without complication ([place])   . Dyspnea    doe  . Dysrhythmia   . GERD (gastroesophageal reflux disease)   . Headache   . History of hiatal hernia   . Hyperlipidemia   . Hypertension   . Myocardial infarction ([place])    2016, 04/2017  . Myocardial infarction with cardiac rehabilitation [facility])    MI 2016/ CABG 8/17    FINISHED CARDIAC REHAB 3 WEEKS AGO  . Panic attack   . Pneumonia   . Reflux   . Stroke [facility]) 2015   showed up on MRI; no weakness noted  . TIA (transient ischemic attack)   . Voice tremor     Patient Active Problem List   Diagnosis Date Noted  . Tachycardia 03/27/2018  . Pain in limb 02/28/2018  . COPD suggested by initial evaluation ([place]) 08/23/2017  . Acute and chronic respiratory failure with hypoxia ([place]) 08/23/2017  . Postoperative state 08/15/2017  . Incidental pulmonary nodule 08/01/2017  . B12 deficiency 12/01/2016  . Vitamin D deficiency 11/04/2016  . Depression with anxiety 09/30/2016  . PAF  (paroxysmal atrial fibrillation) ([place]) 09/30/2016  . Chronic fatigue 09/30/2016  . Resting tremor 09/30/2016  . Mild obstructive sleep apnea 09/30/2016  . Headache 08/18/2016  . Unstable angina ([place]) 08/03/2016  . Diabetes ([place]) 08/03/2016  . Asthma 11/11/2015  . S/P CABG x 4 11/10/2015  . Coronary artery disease 11/07/2015  . Carotid artery narrowing 02/08/2014  . Chest pain 02/08/2014  . Mixed hyperlipidemia 02/08/2014  . Atypical chest pain 02/07/2014  . Essential (primary) hypertension 02/07/2014  . Awareness of heartbeats 02/07/2014  . D (diarrhea) 10/05/2013  . Acid reflux 10/05/2013    Past Surgical History:  Procedure Laterality Date  . ABDOMINAL HYSTERECTOMY    . APPENDECTOMY  1975  . ARTERY BIOPSY Right 04/26/2016   Procedure: BIOPSY TEMPORAL ARTERY;  Surgeon: Margaretha Sheffield, MD;  Location: [facility] ORS;  Service: ENT;  Laterality: Right;  . CARDIAC CATHETERIZATION N/A 11/06/2015   Procedure: Left Heart Cath and Coronary Angiography;  Surgeon: Corey Skains, MD;  Location: [place] CV LAB;  Service: Cardiovascular;  Laterality: N/A;  . CESAREAN SECTION    . COLONOSCOPY  2015  . CORONARY ANGIOPLASTY  04/2017   [place]  . CORONARY ARTERY BYPASS GRAFT N/A 11/10/2015   Procedure: CORONARY ARTERY BYPASS GRAFTING (CABG), ON PUMP, TIMES FOUR, USING LEFT INTERNAL MAMMARY  ARTERY, BILATERAL GREATER SAPHENOUS VEINS HARVESTED ENDOSCOPICALLY;  Surgeon: [name], MD;  Location: [place];  Service: Open Heart Surgery;  Laterality: N/A;  LIMA-LAD; SEQ SVG-OM1-OM2; SVG-PL  . CORONARY STENT INTERVENTION N/A 08/05/2016   Procedure: Coronary Stent Intervention;  Surgeon: Isaias Cowman, MD;  Location: [place] CV LAB;  Service: Cardiovascular;  Laterality: N/A;  . HYSTERECTOMY ABDOMINAL WITH SALPINGO-OOPHORECTOMY Bilateral 08/15/2017   Procedure: HYSTERECTOMY ABDOMINAL WITH BILATERAL SALPINGO-OOPHORECTOMY;  Surgeon: Rubie Maid, MD;  Location: [facility] ORS;   Service: Gynecology;  Laterality: Bilateral;  . LEFT HEART CATH AND CORONARY ANGIOGRAPHY N/A 08/05/2016   Procedure: Left Heart Cath and Coronary Angiography;  Surgeon: Isaias Cowman, MD;  Location: [place] CV LAB;  Service: Cardiovascular;  Laterality: N/A;  . TEE WITHOUT CARDIOVERSION N/A 11/10/2015   Procedure: TRANSESOPHAGEAL ECHOCARDIOGRAM (TEE);  Surgeon: [name], MD;  Location: [place];  Service: Open Heart Surgery;  Laterality: N/A;  . TUBAL LIGATION      Prior to Admission medications   Medication Sig Start Date End Date Taking? Authorizing Provider  acetaminophen (TYLENOL) 500 MG tablet Take 2 tablets (1,000 mg total) by mouth every 8 (eight) hours as needed. 08/18/17   Rubie Maid, MD  benzonatate (TESSALON) 100 MG capsule Take 1 capsule (100 mg total) by mouth 3 (three) times daily as needed for cough. 03/28/18   Demetrios Loll, MD  cephALEXin (KEFLEX) 500 MG capsule Take 1 capsule (500 mg total) by mouth 2 (two) times daily. 04/24/18   Lavonia Drafts, MD  clopidogrel (PLAVIX) 75 MG tablet Take 75 mg by mouth daily.    [provider]  Coenzyme Q10 (CO Q 10) 100 MG CAPS Take 100 mg by mouth daily. 04/25/17   [name]., MD  [facility] ASPIRIN ADULT LOW DOSE 81 MG chewable tablet CHEW 1 TABLET (81 MG TOTAL) BY MOUTH DAILY. Patient taking differently: Chew 81 mg by mouth daily.  11/04/17   [name]., MD  cyproheptadine (PERIACTIN) 4 MG tablet Take 4 mg by mouth 3 (three) times daily as needed for allergies.    [provider]  furosemide (LASIX) 20 MG tablet Take 20 mg by mouth daily.    [provider]  isosorbide mononitrate (IMDUR) 30 MG 24 hr tablet Take 2 tablets (60 mg total) by mouth daily. 11/04/16   Plonk, Gwyndolyn Saxon, MD  loperamide (IMODIUM) 2 MG capsule Take by mouth as needed for diarrhea or loose stools.    [provider]  metFORMIN (GLUCOPHAGE) 1000 MG tablet Take 1 tablet (1,000 mg total) by mouth 2 (two)  times daily with a meal. 02/02/18   [name]., MD  metoprolol tartrate (LOPRESSOR) 50 MG tablet TAKE 1 TABLET BY MOUTH TWICE A DAY FOR HIGH BLOOD PRESSURE Patient taking differently: Take 50 mg by mouth 2 (two) times daily.  02/24/17   [name]., MD  nitroGLYCERIN (NITROSTAT) 0.4 MG SL tablet Place 1 tablet (0.4 mg total) under the tongue every 5 (five) minutes as needed for chest pain. 07/27/16   Bettey Costa, MD  ondansetron (ZOFRAN-ODT) 8 MG disintegrating tablet DISSOLVE 1 TABLET ON THE TONGUE EVERY 8 HOURS AS NEEDED FOR NAUSEA OR VOMITING Patient taking differently: Take 8 mg by mouth every 8 (eight) hours as needed for nausea or vomiting.  11/04/17   Birdie Sons, MD  phenol-menthol (CEPASTAT) 14.5 MG lozenge Place 1 lozenge inside cheek as needed for sore throat. 03/28/18   Demetrios Loll, MD  rosuvastatin (CRESTOR) 10 MG tablet TAKE 1 TABLET BY MOUTH EVERY DAY Patient taking differently: Take 10 mg by mouth daily.  03/08/18   [name]., MD  sucralfate (CARAFATE) 1 g tablet Take 1 tablet (1 g total) by mouth 2 (two) times daily. 10/06/17   [name]., MD  tiZANidine (ZANAFLEX) 2 MG tablet Take 2 mg by mouth at bedtime.     [provider]  traMADol (ULTRAM) 50 MG tablet Take 1 tablet (50 mg total) by mouth every 6 (six) hours as needed. 04/24/18 04/24/19  Lavonia Drafts, MD     Allergies Lisinopril and Penicillins  Family History  Problem Relation Age of Onset  . Cancer Father   . Hypertension Father   . Heart disease Father   . Asthma Father   . Cancer Mother   . Hypertension Mother   . Pancreatic cancer Mother 78  . Cancer Sister   . Breast cancer Sister 35  . Breast cancer Sister 56  . Lung cancer Brother   . Pancreatic cancer Sister 64  . Cancer Sister     Social History Social History   Tobacco Use  . Smoking status: Former Smoker    Packs/day: 0.50    Types: Cigarettes    Last attempt to quit: 10/07/2001    Years  since quitting: 16.5  . Smokeless tobacco: Never Used  Substance Use Topics  . Alcohol use: No    Alcohol/week: 0.0 standard drinks  . Drug use: No    Review of Systems  Constitutional: No fever/chills Eyes: No visual changes.  ENT: No sore throat. Cardiovascular: Denies chest pain. Respiratory: Denies shortness of breath. Gastrointestinal: As above Genitourinary: As above Musculoskeletal: Negative for back pain. Skin: Negative for rash. Neurological: Negative for headaches or weakness   ____________________________________________   PHYSICAL EXAM:  VITAL SIGNS: ED Triage Vitals  Enc Vitals Group     BP 04/24/18 1221 (!) 163/80     Pulse Rate 04/24/18 1221 77     Resp 04/24/18 1221 17     Temp 04/24/18 1221 98 F (36.7 C)     Temp Source 04/24/18 1221 Oral     SpO2 04/24/18 1221 99 %     Weight 04/24/18 1222 78.5 kg (173 lb)     Height 04/24/18 1222 1.575 m (5\' 2" )     Head Circumference --      Peak Flow --      Pain Score 04/24/18 1225 8     Pain Loc --      Pain Edu? --      Excl. in [place]? --     Constitutional: Alert and oriented. No acute distress. Pleasant and  Nose: No congestion/rhinnorhea. Mouth/Throat: Mucous membranes are moist.    Cardiovascular: Normal rate, regular rhythm. Grossly normal heart sounds.  Good peripheral circulation. Respiratory: Normal respiratory effort.  No retractions. Lungs CTAB. Gastrointestinal: Soft and nontender. No distention.  Mild right CVA tenderness  Musculoskeletal:.  Warm and well perfused Neurologic:  Normal speech and language. No gross focal neurologic deficits are appreciated.  Skin:  Skin is warm, dry and intact. No rash noted. Psychiatric: Mood and affect are normal. Speech and behavior are normal.  ____________________________________________   LABS (all labs ordered are listed, but only abnormal results are displayed)  Labs Reviewed  COMPREHENSIVE METABOLIC PANEL - Abnormal; Notable for the following  components:      Result Value   Glucose, Bld 188 (*)  All other components within normal limits  URINALYSIS, COMPLETE (UACMP) WITH MICROSCOPIC - Abnormal; Notable for the following components:   Color, Urine STRAW (*)    APPearance CLEAR (*)    Hgb urine dipstick MODERATE (*)    Leukocytes, UA MODERATE (*)    WBC, UA >50 (*)    Bacteria, UA RARE (*)    Non Squamous Epithelial PRESENT (*)    All other components within normal limits  LIPASE, BLOOD  CBC   ____________________________________________  EKG  ED ECG REPORT I, Lavonia Drafts, the attending physician, personally viewed and interpreted this ECG.  Date: 04/24/2018  Rhythm: normal sinus rhythm QRS Axis: normal Intervals: normal ST/T Wave abnormalities: Nonspecific changes   ____________________________________________  RADIOLOGY CT renal stone study negative for abnormality ____________________________________________   PROCEDURES  Procedure(s) performed: No  Procedures   Critical Care performed: No ____________________________________________   INITIAL IMPRESSION / ASSESSMENT AND PLAN / ED COURSE  Pertinent labs & imaging results that were available during my care of the patient were reviewed by me and considered in my medical decision making (see chart for details).  Patient presents with complaints of right flank pain, differential includes ureterolithiasis, pyelonephritis, UTI also describes dark stools however on rectal exam patient has brown stool, guaiac negative.  CT renal stone study negative for abnormalities, pending urinalysis, treated with Toradol and Zofran  Patient reports significant improvement after treatment.  Urinalysis consistent with urinary tract infection, this may be causing her pain, will Rx antibiotics, outpatient follow-up, strict return precautions discussed    ____________________________________________   FINAL CLINICAL IMPRESSION(S) / ED DIAGNOSES  Final diagnoses:   Right lower quadrant abdominal pain  Lower urinary tract infectious disease        Note:  This document was prepared using Dragon voice recognition software and may include unintentional dictation errors.   Lavonia Drafts, MD 04/24/18 1729

## 2018-04-24 NOTE — Telephone Encounter (Signed)
FYI

## 2018-04-24 NOTE — ED Triage Notes (Signed)
Says upper mid abd pain, right lower abd pain around to back.  All started Thursday.  Says she waited and called her doctor but he told her to come here.  Nausea, no vomiting,  Stools black.

## 2018-04-24 NOTE — Telephone Encounter (Signed)
Patient reports she is still having black tarry stools and some abdominal pain. Patient was advised to go to the ER last week. Patient did not go and was wanting to come in today. I advised patient to please go to ER due to symptoms. Patient agreed. sd

## 2018-04-24 NOTE — Telephone Encounter (Signed)
Patient called stating she was having black stools again and some abd pain.  Call sent to [facility] for triage.

## 2018-05-10 ENCOUNTER — Ambulatory Visit
Admission: RE | Admit: 2018-05-10 | Discharge: 2018-05-10 | Disposition: A | Discharge status: Home / Self Care | Payer: Medicare Other | Source: Ambulatory Visit | Attending: Family Medicine | Admitting: Family Medicine

## 2018-05-10 ENCOUNTER — Ambulatory Visit: Payer: Medicare Other | Admitting: Family Medicine

## 2018-05-10 DIAGNOSIS — Z13820 Encounter for screening for osteoporosis: Secondary | ICD-10-CM | POA: Insufficient documentation

## 2018-05-10 DIAGNOSIS — M85852 Other specified disorders of bone density and structure, left thigh: Secondary | ICD-10-CM | POA: Diagnosis not present

## 2018-06-19 ENCOUNTER — Other Ambulatory Visit: Payer: Self-pay

## 2018-06-19 ENCOUNTER — Ambulatory Visit (INDEPENDENT_AMBULATORY_CARE_PROVIDER_SITE_OTHER): Payer: Medicare Other | Admitting: Family Medicine

## 2018-06-19 ENCOUNTER — Encounter: Payer: Self-pay | Admitting: Family Medicine

## 2018-06-19 VITALS — BP 126/68 | HR 84 | Temp 98.1°F | Resp 16 | Ht 66.0 in | Wt 174.0 lb

## 2018-06-19 DIAGNOSIS — E782 Mixed hyperlipidemia: Secondary | ICD-10-CM

## 2018-06-19 DIAGNOSIS — Z951 Presence of aortocoronary bypass graft: Secondary | ICD-10-CM | POA: Diagnosis not present

## 2018-06-19 DIAGNOSIS — E1159 Type 2 diabetes mellitus with other circulatory complications: Secondary | ICD-10-CM

## 2018-06-19 DIAGNOSIS — I10 Essential (primary) hypertension: Secondary | ICD-10-CM

## 2018-06-19 DIAGNOSIS — I480 Paroxysmal atrial fibrillation: Secondary | ICD-10-CM

## 2018-06-19 MED ORDER — BLOOD GLUCOSE METER KIT: PACK | 0 refills | Status: DC

## 2018-06-19 NOTE — Progress Notes (Signed)
Patient: Candace [name] Female    DOB: 05/30/1950   68 y.o.   MRN: [id number] Visit Date: 06/19/2018  Today's Provider: [name], MD   Chief Complaint  Patient presents with  . Follow-up   Subjective:     HPI   Diabetes Mellitus Type II, Follow-up:   Lab Results  Component Value Date   HGBA1C 9.1 (H) 03/28/2018   HGBA1C 8.2 (H) 01/12/2018   HGBA1C 8.0 (H) 08/16/2017    Last seen for diabetes 3 months ago.  Management since then includes no changes. She reports good compliance with treatment. She is not having side effects.  Current symptoms include none and have been stable. Home blood sugar records: not being checked  Episodes of hypoglycemia? no   Current Insulin Regimen: none Most Recent Eye Exam: 05/2017 Weight trend: stable Prior visit with dietician: No Current exercise: no regular exercise Current diet habits: well balanced  Pertinent Labs:    Component Value Date/Time   CHOL 167 01/12/2018 1014   CHOL 214 (H) 07/14/2013 0542   TRIG 145 01/12/2018 1014   TRIG 118 07/14/2013 0542   HDL 51 01/12/2018 1014   HDL 36 (L) 07/14/2013 0542   LDLCALC 87 01/12/2018 1014   LDLCALC 154 (H) 07/14/2013 0542   CREATININE 0.59 04/24/2018 1241   CREATININE 1.39 (H) 03/24/2017 1638    Wt Readings from Last 3 Encounters:  06/19/18 174 lb (78.9 kg)  04/24/18 173 lb (78.5 kg)  04/05/18 171 lb (77.6 kg)    Hypertension, follow-up:  BP Readings from Last 3 Encounters:  06/19/18 126/68  04/24/18 (!) 178/62  04/05/18 136/84    She was last seen for hypertension 3 months ago.  BP at that visit was 136/84. Management since that visit includes no changes. She reports good compliance with treatment. She is not having side effects.  She is not exercising. She is adherent to low salt diet.   Outside blood pressures are not being checked. She is experiencing none.  Patient denies exertional chest pressure/discomfort, lower extremity edema and  palpitations.   Cardiovascular risk factors include diabetes mellitus and dyslipidemia.    Allergies  Allergen Reactions  . Lisinopril     cough  . Penicillins Swelling, Rash and Other (See Comments)    Has patient had a PCN reaction causing immediate rash, facial/tongue/throat swelling, SOB or lightheadedness with hypotension: Yes Has patient had a PCN reaction causing severe rash involving mucus membranes or skin necrosis: Yes Has patient had a PCN reaction that required hospitalization No Has patient had a PCN reaction occurring within the last 10 years: Yes If all of the above answers are "NO", then may proceed with Cephalosporin use.      Current Outpatient Medications:  .  acetaminophen (TYLENOL) 500 MG tablet, Take 2 tablets (1,000 mg total) by mouth every 8 (eight) hours as needed., Disp: 60 tablet, Rfl: 1 .  clopidogrel (PLAVIX) 75 MG tablet, Take 75 mg by mouth daily., Disp: , Rfl:  .  [facility] ASPIRIN ADULT LOW DOSE 81 MG chewable tablet, CHEW 1 TABLET (81 MG TOTAL) BY MOUTH DAILY. (Patient taking differently: Chew 81 mg by mouth daily. ), Disp: 90 tablet, Rfl: 3 .  cyproheptadine (PERIACTIN) 4 MG tablet, Take 4 mg by mouth 3 (three) times daily as needed for allergies., Disp: , Rfl:  .  furosemide (LASIX) 20 MG tablet, Take 20 mg by mouth daily., Disp: , Rfl:  .  isosorbide mononitrate (  IMDUR) 30 MG 24 hr tablet, Take 2 tablets (60 mg total) by mouth daily., Disp: 60 tablet, Rfl: 12 .  loperamide (IMODIUM) 2 MG capsule, Take by mouth as needed for diarrhea or loose stools., Disp: , Rfl:  .  metFORMIN (GLUCOPHAGE) 1000 MG tablet, Take 1 tablet (1,000 mg total) by mouth 2 (two) times daily with a meal., Disp: 180 tablet, Rfl: 3 .  metoprolol tartrate (LOPRESSOR) 50 MG tablet, TAKE 1 TABLET BY MOUTH TWICE A DAY FOR HIGH BLOOD PRESSURE (Patient taking differently: Take 50 mg by mouth 2 (two) times daily. ), Disp: 60 tablet, Rfl: 12 .  nitroGLYCERIN (NITROSTAT) 0.4 MG SL tablet, Place 1  tablet (0.4 mg total) under the tongue every 5 (five) minutes as needed for chest pain., Disp: 30 tablet, Rfl: 0 .  ondansetron (ZOFRAN-ODT) 8 MG disintegrating tablet, DISSOLVE 1 TABLET ON THE TONGUE EVERY 8 HOURS AS NEEDED FOR NAUSEA OR VOMITING (Patient taking differently: Take 8 mg by mouth every 8 (eight) hours as needed for nausea or vomiting. ), Disp: 30 tablet, Rfl: 1 .  phenol-menthol (CEPASTAT) 14.5 MG lozenge, Place 1 lozenge inside cheek as needed for sore throat., Disp: 20 lozenge, Rfl: 0 .  rosuvastatin (CRESTOR) 10 MG tablet, TAKE 1 TABLET BY MOUTH EVERY DAY (Patient taking differently: Take 10 mg by mouth daily. ), Disp: 90 tablet, Rfl: 3 .  sucralfate (CARAFATE) 1 g tablet, Take 1 tablet (1 g total) by mouth 2 (two) times daily., Disp: 60 tablet, Rfl: 5 .  tiZANidine (ZANAFLEX) 2 MG tablet, Take 2 mg by mouth at bedtime. , Disp: , Rfl:  .  traMADol (ULTRAM) 50 MG tablet, Take 1 tablet (50 mg total) by mouth every 6 (six) hours as needed., Disp: 20 tablet, Rfl: 0 .  benzonatate (TESSALON) 100 MG capsule, Take 1 capsule (100 mg total) by mouth 3 (three) times daily as needed for cough. (Patient not taking: Reported on 06/19/2018), Disp: 20 capsule, Rfl: 0 .  cephALEXin (KEFLEX) 500 MG capsule, Take 1 capsule (500 mg total) by mouth 2 (two) times daily. (Patient not taking: Reported on 06/19/2018), Disp: 14 capsule, Rfl: 0 .  Coenzyme Q10 (CO Q 10) 100 MG CAPS, Take 100 mg by mouth daily. (Patient not taking: Reported on 06/19/2018), Disp: 30 capsule, Rfl: 12  Review of Systems  Constitutional: Negative.   Eyes: Negative.   Respiratory: Negative for cough and choking.   Cardiovascular: Negative for chest pain, palpitations and leg swelling.  Gastrointestinal:       Occasional lower abdominal cramping followed by bowel movement which then resolves  cramping.  Endocrine: Negative for cold intolerance, heat intolerance, polydipsia, polyphagia and polyuria.  Musculoskeletal: Negative for  arthralgias.  Skin: Negative.   Allergic/Immunologic: Negative.   Neurological: Negative for dizziness and headaches.  Hematological: Negative.   Psychiatric/Behavioral: Negative.     Social History   Tobacco Use  . Smoking status: Former Smoker    Packs/day: 0.50    Types: Cigarettes    Last attempt to quit: 10/07/2001    Years since quitting: 16.7  . Smokeless tobacco: Never Used  Substance Use Topics  . Alcohol use: No    Alcohol/week: 0.0 standard drinks      Objective:   BP 126/68 (BP Location: Left Arm, Patient Position: Sitting, Cuff Size: Normal)   Pulse 84   Temp 98.1 F (36.7 C)   Resp 16   Ht 5\' 6"  (1.676 m)   Wt 174 lb (78.9 kg)  SpO2 97%   BMI 28.08 kg/m  Vitals:   06/19/18 0815  BP: 126/68  Pulse: 84  Resp: 16  Temp: 98.1 F (36.7 C)  SpO2: 97%  Weight: 174 lb (78.9 kg)  Height: 5\' 6"  (1.676 m)     Physical Exam Vitals signs reviewed.  Constitutional:      Appearance: She is well-developed.  HENT:     Head: Normocephalic and atraumatic.     Right Ear: External ear normal.     Left Ear: External ear normal.     Nose: Nose normal.  Eyes:     General: No scleral icterus.    Conjunctiva/sclera: Conjunctivae normal.  Neck:     Thyroid: No thyromegaly.  Cardiovascular:     Rate and Rhythm: Normal rate and regular rhythm.     Heart sounds: Normal heart sounds.  Pulmonary:     Effort: Pulmonary effort is normal.  Abdominal:     Palpations: Abdomen is soft.  Skin:    General: Skin is warm and dry.  Neurological:     Mental Status: She is alert and oriented to person, place, and time.  Psychiatric:        Behavior: Behavior normal.        Thought Content: Thought content normal.        Judgment: Judgment normal.         Assessment & Plan    1. Type 2 diabetes mellitus with other circulatory complication, without long-term current use of insulin [facility]) Patient given new prescription for glucometer and strips.  Told to check blood  sugar every morning.  If fasting blood sugars above 200 to call us back to adjust medication.  On metformin presently.  Last A1c 9.1.  Recheck in 2 to 3 months to recheck Pete A1c.  We will try to keep patient out of the office during coronavirus pandemic.  2. S/P CABG x 4 Doing very well.  Patient feels back to normal.  No chest pain.  3. PAF (paroxysmal atrial fibrillation) ([place])   4. Benign essential HTN Well-controlled  5. Mixed hyperlipidemia Treated with rosuvastatin.    I have done the exam and reviewed the above chart and it is accurate to the best of my knowledge. Development worker, community has been used in this note in any air is in the dictation or transcription are unintentional.  [name], MD  [place]

## 2018-06-22 ENCOUNTER — Other Ambulatory Visit: Payer: Self-pay

## 2018-06-22 DIAGNOSIS — E1159 Type 2 diabetes mellitus with other circulatory complications: Secondary | ICD-10-CM

## 2018-06-22 MED ORDER — BLOOD GLUCOSE METER KIT: PACK | 0 refills | Status: DC

## 2018-06-22 NOTE — Telephone Encounter (Signed)
[facility] [address] [facility]'t [facility] for blood glucose meter kit and supplies Checked and it was marked as to print. Told patient I will go ahead and resend patient is asking [facility] [address], [place] 

## 2018-07-05 DIAGNOSIS — R9431 Abnormal electrocardiogram [ECG] [EKG]: Secondary | ICD-10-CM | POA: Insufficient documentation

## 2018-07-11 ENCOUNTER — Other Ambulatory Visit: Payer: Self-pay

## 2018-07-11 ENCOUNTER — Other Ambulatory Visit: Payer: Self-pay | Admitting: Family Medicine

## 2018-07-11 DIAGNOSIS — E1159 Type 2 diabetes mellitus with other circulatory complications: Secondary | ICD-10-CM

## 2018-07-11 MED ORDER — LANCETS 30G MISC: 3 refills | Status: DC

## 2018-07-11 MED ORDER — GLUCOSE BLOOD VI STRP: ORAL_STRIP | 3 refills | Status: DC

## 2018-07-11 NOTE — Telephone Encounter (Signed)
Does she need a meter or test strips? If needing a meter, a generic order like this won't transmit electronically - only printed for pick up. If you choose Accu-Chek or Contour brands, they will transmit electronically.

## 2018-07-11 NOTE — Telephone Encounter (Signed)
Left patient contour glucometer up front. Can we send in supplies for this? Thanks!

## 2018-07-11 NOTE — Telephone Encounter (Signed)
Could you send in for the patient? It would only print out for me. She is a Surveyor, quantity patient, and reports that we never sent it into the pharmacy. Thanks!

## 2018-07-11 NOTE — Telephone Encounter (Signed)
Done. Please be sure patient knows lancets and test strips are at he pharmacy and we have the glucometer here for her to pick up.

## 2018-07-12 NOTE — Telephone Encounter (Signed)
Advised patient's daughter.

## 2018-07-17 ENCOUNTER — Telehealth: Payer: Self-pay

## 2018-07-17 NOTE — Telephone Encounter (Signed)
Rachelle please call [name]. She would like to speak with you.

## 2018-07-17 NOTE — Telephone Encounter (Signed)
Appt was scheduled tomorrow for evaluation. She has not vomited any more blood since we spoke earlier.

## 2018-07-17 NOTE — Telephone Encounter (Signed)
Spoke with the patient and she reports that she has vomited blood 3x, and it started about 44mins ago. She reports that she feels fine otherwise. Denies any issues with GERD and she has not taken any NSAIDs excessively. She does feel a little nauseated but reports that is due to the taste that it left in her mouth. She describes it as being bright red, and its only a handful. I recommended that she go to the ER for evaluation, but she wanted to get your recommendation as well. Please advise. Thanks!

## 2018-07-17 NOTE — Telephone Encounter (Signed)
She has stopped spitting up blood we can see her in the morning.  If she continues she needs to go to the emergency room.  It is a lot of blood and she needs to go.  If is just occasional spots then they can wait till tomorrow and we will see her in the office.

## 2018-07-18 ENCOUNTER — Encounter: Payer: Self-pay | Admitting: Family Medicine

## 2018-07-18 ENCOUNTER — Other Ambulatory Visit: Payer: Self-pay

## 2018-07-18 ENCOUNTER — Ambulatory Visit (INDEPENDENT_AMBULATORY_CARE_PROVIDER_SITE_OTHER): Payer: Medicare Other | Admitting: Family Medicine

## 2018-07-18 VITALS — BP 110/70 | HR 72 | Temp 98.0°F | Resp 16 | Wt 175.0 lb

## 2018-07-18 DIAGNOSIS — K920 Hematemesis: Secondary | ICD-10-CM | POA: Diagnosis not present

## 2018-07-18 DIAGNOSIS — I25708 Atherosclerosis of coronary artery bypass graft(s), unspecified, with other forms of angina pectoris: Secondary | ICD-10-CM

## 2018-07-18 DIAGNOSIS — K279 Peptic ulcer, site unspecified, unspecified as acute or chronic, without hemorrhage or perforation: Secondary | ICD-10-CM

## 2018-07-18 DIAGNOSIS — E1159 Type 2 diabetes mellitus with other circulatory complications: Secondary | ICD-10-CM

## 2018-07-18 DIAGNOSIS — I480 Paroxysmal atrial fibrillation: Secondary | ICD-10-CM | POA: Diagnosis not present

## 2018-07-18 DIAGNOSIS — F418 Other specified anxiety disorders: Secondary | ICD-10-CM

## 2018-07-18 MED ORDER — ONDANSETRON 8 MG PO TBDP: ORAL_TABLET | ORAL | 1 refills | Status: DC

## 2018-07-18 MED ORDER — PANTOPRAZOLE SODIUM 20 MG PO TBEC: 20.0000 mg | DELAYED_RELEASE_TABLET | Freq: Two times a day (BID) | ORAL | 11 refills | Status: DC

## 2018-07-18 NOTE — Patient Instructions (Addendum)
1. Hematemesis with nausea Given rx for pantoprazole 20 mg bid and refilled Zofran 8 mg.   CBC  Return for follow up in 2-3 weeks.

## 2018-07-18 NOTE — Progress Notes (Signed)
Patient: Candace [name] Female    DOB: [birth year]   68 y.o.   MRN: [id number] Visit Date: 07/18/2018  Today's Provider: [name], MD   Chief Complaint  Patient presents with  . Emesis   Subjective:     HPI   Patient has been having nausea since yesterday. Patient states she vomited once and saw blood in her vomit. Patient states she has continued to have nausea and upper abdominal pain. Patient states she has been taking Tylenol for the pain but got no relief. Patient also had diarrhea yesterday.  She is a difficult historian.  She states she threw up 3 times yesterday.  She says the first time she had streaks of blood in it.  The second 2 times states that it was dark red.  Not a lot of volume either time.  Overall she has been been feeling fairly well.  He has some mild epigastric distress with this situation.  Is had no fever chills myalgias cough or any symptoms of COVID-19.   Allergies  Allergen Reactions  . Lisinopril     cough  . Penicillins Swelling, Rash and Other (See Comments)    Has patient had a PCN reaction causing immediate rash, facial/tongue/throat swelling, SOB or lightheadedness with hypotension: Yes Has patient had a PCN reaction causing severe rash involving mucus membranes or skin necrosis: Yes Has patient had a PCN reaction that required hospitalization No Has patient had a PCN reaction occurring within the last 10 years: Yes If all of the above answers are "NO", then may proceed with Cephalosporin use.      Current Outpatient Medications:  .  acetaminophen (TYLENOL) 500 MG tablet, Take 2 tablets (1,000 mg total) by mouth every 8 (eight) hours as needed., Disp: 60 tablet, Rfl: 1 .  blood glucose meter kit and supplies, Dispense based on patient and insurance preference. Use up to four times daily as directed. (FOR ICD-10 E10.9, E11.9)., Disp: 1 each, Rfl: 0 .  clopidogrel (PLAVIX) 75 MG tablet, Take 75 mg by mouth daily., Disp: , Rfl:  .   [facility] ASPIRIN ADULT LOW DOSE 81 MG chewable tablet, CHEW 1 TABLET (81 MG TOTAL) BY MOUTH DAILY. (Patient taking differently: Chew 81 mg by mouth daily. ), Disp: 90 tablet, Rfl: 3 .  cyproheptadine (PERIACTIN) 4 MG tablet, Take 4 mg by mouth 3 (three) times daily as needed for allergies., Disp: , Rfl:  .  furosemide (LASIX) 20 MG tablet, Take 20 mg by mouth daily., Disp: , Rfl:  .  glucose blood test strip, Test fasting glucose daily with Contour Next One glucometer., Disp: 100 each, Rfl: 3 .  isosorbide mononitrate (IMDUR) 30 MG 24 hr tablet, Take 2 tablets (60 mg total) by mouth daily. (Patient taking differently: Take 30 mg by mouth daily. ), Disp: 60 tablet, Rfl: 12 .  Lancets 30G MISC, Test fasting glucose daily with Contour Next One glucometer., Disp: 100 each, Rfl: 3 .  loperamide (IMODIUM) 2 MG capsule, Take by mouth as needed for diarrhea or loose stools., Disp: , Rfl:  .  metFORMIN (GLUCOPHAGE) 1000 MG tablet, Take 1 tablet (1,000 mg total) by mouth 2 (two) times daily with a meal., Disp: 180 tablet, Rfl: 3 .  metoprolol tartrate (LOPRESSOR) 50 MG tablet, TAKE 1 TABLET BY MOUTH TWICE A DAY FOR HIGH BLOOD PRESSURE (Patient taking differently: Take 50 mg by mouth 2 (two) times daily. ), Disp: 60 tablet, Rfl: 12 .  ondansetron (ZOFRAN-ODT) 8 MG disintegrating tablet, DISSOLVE 1 TABLET ON THE TONGUE EVERY 8 HOURS AS NEEDED FOR NAUSEA OR VOMITING (Patient taking differently: Take 8 mg by mouth every 8 (eight) hours as needed for nausea or vomiting. ), Disp: 30 tablet, Rfl: 1 .  benzonatate (TESSALON) 100 MG capsule, Take 1 capsule (100 mg total) by mouth 3 (three) times daily as needed for cough. (Patient not taking: Reported on 06/19/2018), Disp: 20 capsule, Rfl: 0 .  cephALEXin (KEFLEX) 500 MG capsule, Take 1 capsule (500 mg total) by mouth 2 (two) times daily. (Patient not taking: Reported on 06/19/2018), Disp: 14 capsule, Rfl: 0 .  Coenzyme Q10 (CO Q 10) 100 MG CAPS, Take 100 mg by mouth daily.  (Patient not taking: Reported on 06/19/2018), Disp: 30 capsule, Rfl: 12 .  nitroGLYCERIN (NITROSTAT) 0.4 MG SL tablet, Place 1 tablet (0.4 mg total) under the tongue every 5 (five) minutes as needed for chest pain. (Patient not taking: Reported on 07/18/2018), Disp: 30 tablet, Rfl: 0 .  phenol-menthol (CEPASTAT) 14.5 MG lozenge, Place 1 lozenge inside cheek as needed for sore throat. (Patient not taking: Reported on 07/18/2018), Disp: 20 lozenge, Rfl: 0 .  rosuvastatin (CRESTOR) 10 MG tablet, TAKE 1 TABLET BY MOUTH EVERY DAY (Patient not taking: No sig reported), Disp: 90 tablet, Rfl: 3 .  sucralfate (CARAFATE) 1 g tablet, Take 1 tablet (1 g total) by mouth 2 (two) times daily. (Patient not taking: Reported on 07/18/2018), Disp: 60 tablet, Rfl: 5 .  tiZANidine (ZANAFLEX) 2 MG tablet, Take 2 mg by mouth at bedtime. , Disp: , Rfl:  .  traMADol (ULTRAM) 50 MG tablet, Take 1 tablet (50 mg total) by mouth every 6 (six) hours as needed. (Patient not taking: Reported on 07/18/2018), Disp: 20 tablet, Rfl: 0  Review of Systems  Constitutional: Negative for appetite change, chills, fatigue and fever.  Respiratory: Negative for chest tightness and shortness of breath.   Cardiovascular: Negative for chest pain and palpitations.  Gastrointestinal: Positive for abdominal pain, diarrhea and nausea. Negative for vomiting.  Endocrine: Negative.   Genitourinary: Negative.   Allergic/Immunologic: Negative.   Neurological: Negative for dizziness and weakness.  Psychiatric/Behavioral: Negative.     Social History   Tobacco Use  . Smoking status: Former Smoker    Packs/day: 0.50    Types: Cigarettes    Last attempt to quit: 10/07/2001    Years since quitting: 16.7  . Smokeless tobacco: Never Used  Substance Use Topics  . Alcohol use: No    Alcohol/week: 0.0 standard drinks      Objective:   BP 110/70 (BP Location: Left Arm, Patient Position: Sitting, Cuff Size: Large)   Pulse 72   Temp 98 F (36.7 C)  (Oral)   Resp 16   Wt 175 lb (79.4 kg)   SpO2 96%   BMI 28.25 kg/m  Vitals:   07/18/18 1026  BP: 110/70  Pulse: 72  Resp: 16  Temp: 98 F (36.7 C)  TempSrc: Oral  SpO2: 96%  Weight: 175 lb (79.4 kg)     Physical Exam HENT:     Head: Normocephalic and atraumatic.     Right Ear: External ear normal.     Left Ear: External ear normal.     Nose: Nose normal.     Mouth/Throat:     Pharynx: Oropharynx is clear.  Eyes:     General: No scleral icterus. Cardiovascular:     Rate and Rhythm: Normal rate and regular rhythm.  Heart sounds: Normal heart sounds.  Pulmonary:     Effort: Pulmonary effort is normal.     Breath sounds: Normal breath sounds.  Abdominal:     Palpations: Abdomen is soft.     Comments: Very minimal epigastric tenderness.  No guarding or rebound.  Musculoskeletal:     Right lower leg: No edema.     Left lower leg: No edema.  Skin:    General: Skin is warm and dry.  Neurological:     General: No focal deficit present.     Mental Status: She is alert. Mental status is at baseline.  Psychiatric:        Mood and Affect: Mood normal.        Behavior: Behavior normal.        Thought Content: Thought content normal.        Judgment: Judgment normal.         Assessment & Plan    1. Hematemesis with nausea This is resolved in the last 24 hours.  No GI work-up now in face of coronavirus pandemic.  Start pantoprazole 20 mg twice daily.  Check CBC and return to clinic 2 to 3 weeks. - CBC w/Diff/Platelet  2. PUD (peptic ulcer disease) More likely gastritis.  This persist may need to Helicobacter test  3. Coronary artery disease of bypass graft of native heart with stable angina pectoris (HCC) Stable.  4. PAF (paroxysmal atrial fibrillation) ([place])   5. Type 2 diabetes mellitus with other circulatory complication, without long-term current use of insulin ([place])   6. Depression with anxiety Presently doing well and stable.  I,April  Miller,acting as a scribe for [name], MD.,have documented all relevant documentation on the behalf of [name], MD,as directed by  [name], MD while in the presence of [name], MD.  I have done the exam and reviewed the above chart and it is accurate to the best of my knowledge. Development worker, community has been used in this note in any air is in the dictation or transcription are unintentional.  [name], MD  [place]

## 2018-07-19 LAB — CBC WITH DIFFERENTIAL/PLATELET
Basophils Absolute: 0 10*3/uL (ref 0.0–0.2)
Basos: 0 %
EOS (ABSOLUTE): 0.2 10*3/uL (ref 0.0–0.4)
Eos: 2 %
Hematocrit: 38.7 % (ref 34.0–46.6)
Hemoglobin: 13.5 g/dL (ref 11.1–15.9)
Immature Grans (Abs): 0 10*3/uL (ref 0.0–0.1)
Immature Granulocytes: 0 %
Lymphocytes Absolute: 3.1 10*3/uL (ref 0.7–3.1)
Lymphs: 44 %
MCH: 31.8 pg (ref 26.6–33.0)
MCHC: 34.9 g/dL (ref 31.5–35.7)
MCV: 91 fL (ref 79–97)
Monocytes Absolute: 0.5 10*3/uL (ref 0.1–0.9)
Monocytes: 7 %
Neutrophils Absolute: 3.3 10*3/uL (ref 1.4–7.0)
Neutrophils: 47 %
Platelets: 215 10*3/uL (ref 150–450)
RBC: 4.24 x10E6/uL (ref 3.77–5.28)
RDW: 13.1 % (ref 11.7–15.4)
WBC: 7.1 10*3/uL (ref 3.4–10.8)

## 2018-07-20 ENCOUNTER — Other Ambulatory Visit: Payer: Self-pay

## 2018-07-20 DIAGNOSIS — E1159 Type 2 diabetes mellitus with other circulatory complications: Secondary | ICD-10-CM

## 2018-07-20 MED ORDER — BLOOD GLUCOSE METER KIT: PACK | 3 refills | Status: DC

## 2018-07-20 MED ORDER — BLOOD GLUCOSE METER KIT: PACK | 0 refills | Status: DC

## 2018-07-20 NOTE — Telephone Encounter (Signed)
Pharmacist states that reason strip script was not filled because insurance on covers Accu Chek and One Touch . KW  Spoke with patient on the phone who states that when she was in office we gave her a contour glucomometer so she says she does not know which strip she would need if her insurance wont cover the contour test strips. Please advise. KW

## 2018-07-20 NOTE — Telephone Encounter (Signed)
Patient calling that she need refill on her test strips. It looks like [facility] [address] [facility] with 3 refills. Please check with her pharmacy and call patient back. CB# [phone]

## 2018-07-20 NOTE — Telephone Encounter (Signed)
We have to get her meter and strips covered by her insurance.

## 2018-07-24 MED ORDER — GLUCOSE BLOOD VI STRP: ORAL_STRIP | 12 refills | Status: DC

## 2018-07-24 MED ORDER — ACCU-CHEK AVIVA PLUS W/DEVICE KIT: 1.0000 | PACK | Freq: Every day | 0 refills | Status: DC

## 2018-07-24 NOTE — Telephone Encounter (Signed)
Ordered as below. It would only print when I tried to send into the pharmacy. Please send in. Thanks! [facility].

## 2018-08-03 ENCOUNTER — Other Ambulatory Visit: Payer: Self-pay

## 2018-08-03 ENCOUNTER — Ambulatory Visit (INDEPENDENT_AMBULATORY_CARE_PROVIDER_SITE_OTHER): Payer: Medicare Other | Admitting: Family Medicine

## 2018-08-03 VITALS — BP 140/74 | HR 77 | Temp 98.8°F | Wt 174.0 lb

## 2018-08-03 DIAGNOSIS — G4733 Obstructive sleep apnea (adult) (pediatric): Secondary | ICD-10-CM

## 2018-08-03 DIAGNOSIS — I25708 Atherosclerosis of coronary artery bypass graft(s), unspecified, with other forms of angina pectoris: Secondary | ICD-10-CM | POA: Diagnosis not present

## 2018-08-03 DIAGNOSIS — E1159 Type 2 diabetes mellitus with other circulatory complications: Secondary | ICD-10-CM

## 2018-08-03 DIAGNOSIS — K219 Gastro-esophageal reflux disease without esophagitis: Secondary | ICD-10-CM

## 2018-08-03 DIAGNOSIS — K920 Hematemesis: Secondary | ICD-10-CM | POA: Diagnosis not present

## 2018-08-03 DIAGNOSIS — F418 Other specified anxiety disorders: Secondary | ICD-10-CM

## 2018-08-03 NOTE — Progress Notes (Signed)
Candace [name]  MRN: [id number] DOB: 1950/11/18  Subjective:  HPI   The patient is a 68 year old female who presents for 2 week follow up.  Candace [name] was last seen on 07/18/18 with complaint of nausea with hematemesis.  It was diagnosed at the time as being a probable likely gastritis with possibility of it being PUD.  Candace [name] was prescribed Pantoprazole BID, labs were checked and Candace [name] was instructed to return for follow up.  Candace [name] was advised that if symptoms persist Candace [name] may need to have H.Pylori testing.  The patient states Candace [name] started feeling better about 3 days after her last visit until last night.  About 2 am Candace [name] started having nausea, vomiting and diarrhea.  Candace [name] denies any blood in the vomit this time.   These symptoms have completely resolved. Patient Active Problem List   Diagnosis Date Noted   Tachycardia 03/27/2018   Pain in limb 02/28/2018   COPD suggested by initial evaluation ([place]) 08/23/2017   Acute and chronic respiratory failure with hypoxia ([place]) 08/23/2017   Postoperative state 08/15/2017   Incidental pulmonary nodule 08/01/2017   B12 deficiency 12/01/2016   Vitamin D deficiency 11/04/2016   Depression with anxiety 09/30/2016   PAF (paroxysmal atrial fibrillation) ([place]) 09/30/2016   Resting tremor 09/30/2016   Mild obstructive sleep apnea 09/30/2016   Headache 08/18/2016   Unstable angina ([place]) 08/03/2016   Diabetes ([place]) 08/03/2016   Asthma 11/11/2015   S/P CABG x 4 11/10/2015   Coronary artery disease 11/07/2015   Carotid artery narrowing 02/08/2014   Chest pain 02/08/2014   Mixed hyperlipidemia 02/08/2014   Atypical chest pain 02/07/2014   Essential (primary) hypertension 02/07/2014   Awareness of heartbeats 02/07/2014   D (diarrhea) 10/05/2013   Acid reflux 10/05/2013    Past Medical History:  Diagnosis Date   Allergy    Anemia    Anxiety    Arrhythmia    Arthritis    Coronary artery disease    Depression    Diabetes  mellitus without complication ([place])    Dyspnea    doe   Dysrhythmia    GERD (gastroesophageal reflux disease)    Headache    History of hiatal hernia    Hyperlipidemia    Hypertension    Myocardial infarction ([place])    2016, 04/2017   Myocardial infarction with cardiac rehabilitation [facility])    MI 2016/ CABG 8/17    FINISHED CARDIAC REHAB 3 WEEKS AGO   Panic attack    Pneumonia    Reflux    Stroke ([place]) 2015   showed up on MRI; no weakness noted   TIA (transient ischemic attack)    Voice tremor     Social History   Socioeconomic History   Marital status: Divorced    Spouse name: Not on file   Number of children: 3   Years of education: Not on file   Highest education level: Some college, no degree  Occupational History   Occupation: retired  Scientist, product/process development strain: Not hard at [employer] insecurity:    Worry: Never true    Inability: Never true   Transportation needs:    Medical: No    Non-medical: No  Tobacco Use   Smoking status: Former Smoker    Packs/day: 0.50    Types: Cigarettes    Last attempt to quit: 10/07/2001    Years since quitting: 16.8   Smokeless tobacco: Never Used  Substance and Sexual  Activity   Alcohol use: No    Alcohol/week: 0.0 standard drinks   Drug use: No   Sexual activity: Not Currently  Lifestyle   Physical activity:    Days per week: 0 days    Minutes per session: 0 min   Stress: Not at all  Relationships   Social connections:    Talks on phone: Patient refused    Gets together: Patient refused    Attends religious service: Patient refused    Active member of club or organization: Patient refused    Attends meetings of clubs or organizations: Patient refused    Relationship status: Patient refused   Intimate partner violence:    Fear of current or ex partner: Patient refused    Emotionally abused: Patient refused    Physically abused: Patient refused    Forced sexual activity:  Patient refused  Other Topics Concern   Not on file  Social History Narrative   Not on file    Outpatient Encounter Medications as of 08/03/2018  Medication Sig   acetaminophen (TYLENOL) 500 MG tablet Take 2 tablets (1,000 mg total) by mouth every 8 (eight) hours as needed.   blood glucose meter kit and supplies Dispense based on patient and insurance preference. Use up to four times daily as directed. (FOR ICD-10 E10.9, E11.9).   Blood Glucose Monitoring Suppl (ACCU-CHEK AVIVA PLUS) w/Device KIT 1 Device by Does not apply route daily.   clopidogrel (PLAVIX) 75 MG tablet Take 75 mg by mouth daily.   [facility] ASPIRIN ADULT LOW DOSE 81 MG chewable tablet CHEW 1 TABLET (81 MG TOTAL) BY MOUTH DAILY. (Patient taking differently: Chew 81 mg by mouth daily. )   cyproheptadine (PERIACTIN) 4 MG tablet Take 4 mg by mouth 3 (three) times daily as needed for allergies.   furosemide (LASIX) 20 MG tablet Take 20 mg by mouth every other day.    glucose blood (ACCU-CHEK AVIVA PLUS) test strip Check blood sugar once daily.   glucose blood test strip Test fasting glucose daily with Contour Next One glucometer.   isosorbide mononitrate (IMDUR) 30 MG 24 hr tablet Take 2 tablets (60 mg total) by mouth daily. (Patient taking differently: Take 30 mg by mouth daily. )   Lancets 30G MISC Test fasting glucose daily with Contour Next One glucometer.   loperamide (IMODIUM) 2 MG capsule Take by mouth as needed for diarrhea or loose stools.   metFORMIN (GLUCOPHAGE) 1000 MG tablet Take 1 tablet (1,000 mg total) by mouth 2 (two) times daily with a meal.   metoprolol tartrate (LOPRESSOR) 50 MG tablet TAKE 1 TABLET BY MOUTH TWICE A DAY FOR HIGH BLOOD PRESSURE (Patient taking differently: Take 50 mg by mouth 2 (two) times daily. )   ondansetron (ZOFRAN-ODT) 8 MG disintegrating tablet DISSOLVE 1 TABLET ON THE TONGUE EVERY 8 HOURS AS NEEDED FOR NAUSEA OR VOMITING   pantoprazole (PROTONIX) 20 MG tablet Take 1 tablet  (20 mg total) by mouth 2 (two) times daily.   nitroGLYCERIN (NITROSTAT) 0.4 MG SL tablet Place 1 tablet (0.4 mg total) under the tongue every 5 (five) minutes as needed for chest pain. (Patient not taking: Reported on 08/03/2018)   rosuvastatin (CRESTOR) 10 MG tablet TAKE 1 TABLET BY MOUTH EVERY DAY (Patient not taking: No sig reported)   sucralfate (CARAFATE) 1 g tablet Take 1 tablet (1 g total) by mouth 2 (two) times daily. (Patient not taking: Reported on 07/18/2018)   tiZANidine (ZANAFLEX) 2 MG tablet Take 2 mg by  mouth at bedtime.    traMADol (ULTRAM) 50 MG tablet Take 1 tablet (50 mg total) by mouth every 6 (six) hours as needed. (Patient not taking: Reported on 07/18/2018)   [DISCONTINUED] benzonatate (TESSALON) 100 MG capsule Take 1 capsule (100 mg total) by mouth 3 (three) times daily as needed for cough. (Patient not taking: Reported on 06/19/2018)   [DISCONTINUED] cephALEXin (KEFLEX) 500 MG capsule Take 1 capsule (500 mg total) by mouth 2 (two) times daily. (Patient not taking: Reported on 06/19/2018)   [DISCONTINUED] Coenzyme Q10 (CO Q 10) 100 MG CAPS Take 100 mg by mouth daily. (Patient not taking: Reported on 06/19/2018)   [DISCONTINUED] phenol-menthol (CEPASTAT) 14.5 MG lozenge Place 1 lozenge inside cheek as needed for sore throat. (Patient not taking: Reported on 07/18/2018)   No facility-administered encounter medications on file as of 08/03/2018.     Allergies  Allergen Reactions   Lisinopril     cough   Penicillins Swelling, Rash and Other (See Comments)    Has patient had a PCN reaction causing immediate rash, facial/tongue/throat swelling, SOB or lightheadedness with hypotension: Yes Has patient had a PCN reaction causing severe rash involving mucus membranes or skin necrosis: Yes Has patient had a PCN reaction that required hospitalization No Has patient had a PCN reaction occurring within the last 10 years: Yes If all of the above answers are "NO", then may proceed  with Cephalosporin use.     Review of Systems  Constitutional: Negative for fever.  Eyes: Negative.   Respiratory: Negative for shortness of breath (occasionally).   Cardiovascular: Negative.   Gastrointestinal: Positive for diarrhea, nausea and vomiting. Negative for abdominal pain.  Skin: Negative.   Neurological: Negative.   Endo/Heme/Allergies: Negative.   Psychiatric/Behavioral: Negative.     Objective:  BP 140/74 (BP Location: Right Arm, Patient Position: Sitting, Cuff Size: Normal)    Pulse 77    Temp 98.8 F (37.1 C) (Oral)    Wt 174 lb (78.9 kg)    SpO2 98%    BMI 28.08 kg/m   Physical Exam  Constitutional: Candace [name] is oriented to person, place, and time and well-developed, well-nourished, and in no distress.  HENT:  Head: Normocephalic and atraumatic.  Eyes: Conjunctivae are normal.  Neck: No thyromegaly present.  Cardiovascular: Normal rate and regular rhythm.  Pulmonary/Chest: Effort normal and breath sounds normal.  Abdominal: Soft.  Neurological: Candace [name] is alert and oriented to person, place, and time. Gait normal. GCS score is 15.  Skin: Skin is warm and dry.  Psychiatric: Mood, memory, affect and judgment normal.    Assessment and Plan :   1. Hematemesis with nausea Resolved,self limited.Unsure if any blood,unlikely. Try probiotic daily. 2. Coronary artery disease of bypass graft of native heart with stable angina pectoris ([place])   3. Mild obstructive sleep apnea   4. Gastroesophageal reflux disease, esophagitis presence not specified   5. Type 2 diabetes mellitus with other circulatory complication, without long-term current use of insulin ([place])   6. Depression with anxiety In remission.  I have done the exam and reviewed the chart and it is accurate to the best of my knowledge. Development worker, community has been used and  any errors in dictation or transcription are unintentional. Miguel Aschoff M.D. [facility]

## 2018-08-03 NOTE — Patient Instructions (Signed)
Try OTC Probiotic daily.

## 2018-08-10 ENCOUNTER — Other Ambulatory Visit: Payer: Self-pay | Admitting: Family Medicine

## 2018-08-14 ENCOUNTER — Other Ambulatory Visit: Payer: Self-pay | Admitting: Family Medicine

## 2018-08-14 DIAGNOSIS — E1159 Type 2 diabetes mellitus with other circulatory complications: Secondary | ICD-10-CM

## 2018-08-29 ENCOUNTER — Telehealth: Payer: Self-pay | Admitting: Family Medicine

## 2018-08-29 NOTE — Telephone Encounter (Signed)
Please advise 

## 2018-08-29 NOTE — Telephone Encounter (Signed)
Pt has not being feeling well since last week.  Headache, sore throat, tightness in chest.  No cough, allergies seem to be acting up  CB#  [phone]  ter[place]

## 2018-08-30 NOTE — Telephone Encounter (Signed)
Patient advised.KW 

## 2018-08-30 NOTE — Telephone Encounter (Signed)
Please review. Thanks!  

## 2018-08-30 NOTE — Telephone Encounter (Signed)
Add zyrtec 10mg   and flonase NS. thanks

## 2018-09-13 ENCOUNTER — Telehealth: Payer: Self-pay

## 2018-09-13 NOTE — Telephone Encounter (Signed)
Patient called the office today with concerns of shortness of breath that has been occurring since Sunday. Patient states that manly she has shortness of breath on exertion but has been remaninig constant she states when she moves. Patient reports that associated with shortness of breath she has had fatigue, runny nose, body aches and sneezing, patient states that she has been taking Flonase as you suggested last week for runny nose and sneezing but it has not improved. Given the patients suspected covid like symptoms I informed her that it would be best that she be evaluated but from her home. Patient was in agreeance with a telephone visit with you if applicable. I informed patient that schedule is full today but there is a opening I noticed tomorrow at 9 that was a CPE slot is it okay to take this slot for a televisit? KW

## 2018-09-13 NOTE — Telephone Encounter (Signed)
Scheduled telephone visit tomorrow at 1pm.

## 2018-09-13 NOTE — Telephone Encounter (Signed)
1 oclock tomorrow,9 would not work

## 2018-09-13 NOTE — Telephone Encounter (Signed)
Left message for patient to call and schedule televisit for tomorrow at 1 pm.

## 2018-09-14 ENCOUNTER — Other Ambulatory Visit: Payer: Self-pay

## 2018-09-14 ENCOUNTER — Other Ambulatory Visit
Admission: RE | Admit: 2018-09-14 | Discharge: 2018-09-14 | Disposition: A | Discharge status: Home / Self Care | Payer: Medicare Other | Source: Ambulatory Visit | Attending: Family Medicine | Admitting: Family Medicine

## 2018-09-14 ENCOUNTER — Ambulatory Visit: Payer: Medicaid Other | Admitting: Family Medicine

## 2018-09-14 ENCOUNTER — Ambulatory Visit (INDEPENDENT_AMBULATORY_CARE_PROVIDER_SITE_OTHER): Payer: Medicare Other | Admitting: Family Medicine

## 2018-09-14 ENCOUNTER — Encounter: Payer: Self-pay | Admitting: Family Medicine

## 2018-09-14 VITALS — BP 152/72 | HR 74 | Temp 98.0°F | Wt 176.4 lb

## 2018-09-14 DIAGNOSIS — K219 Gastro-esophageal reflux disease without esophagitis: Secondary | ICD-10-CM

## 2018-09-14 DIAGNOSIS — R0609 Other forms of dyspnea: Secondary | ICD-10-CM | POA: Diagnosis present

## 2018-09-14 DIAGNOSIS — E1159 Type 2 diabetes mellitus with other circulatory complications: Secondary | ICD-10-CM

## 2018-09-14 DIAGNOSIS — R079 Chest pain, unspecified: Secondary | ICD-10-CM

## 2018-09-14 DIAGNOSIS — F418 Other specified anxiety disorders: Secondary | ICD-10-CM

## 2018-09-14 DIAGNOSIS — I25708 Atherosclerosis of coronary artery bypass graft(s), unspecified, with other forms of angina pectoris: Secondary | ICD-10-CM | POA: Diagnosis not present

## 2018-09-14 DIAGNOSIS — R0600 Dyspnea, unspecified: Secondary | ICD-10-CM

## 2018-09-14 DIAGNOSIS — G4733 Obstructive sleep apnea (adult) (pediatric): Secondary | ICD-10-CM

## 2018-09-14 DIAGNOSIS — Z951 Presence of aortocoronary bypass graft: Secondary | ICD-10-CM

## 2018-09-14 LAB — TSH: TSH: 1.262 u[IU]/mL (ref 0.350–4.500)

## 2018-09-14 LAB — COMPREHENSIVE METABOLIC PANEL
ALT: 11 U/L (ref 0–44)
AST: 16 U/L (ref 15–41)
Albumin: 4.1 g/dL (ref 3.5–5.0)
Alkaline Phosphatase: 121 U/L (ref 38–126)
Anion gap: 12 (ref 5–15)
BUN: 13 mg/dL (ref 8–23)
CO2: 26 mmol/L (ref 22–32)
Calcium: 8.7 mg/dL — ABNORMAL LOW (ref 8.9–10.3)
Chloride: 100 mmol/L (ref 98–111)
Creatinine, Ser: 0.85 mg/dL (ref 0.44–1.00)
GFR calc Af Amer: >60 mL/min (ref >60)
GFR calc non Af Amer: >60 mL/min (ref >60)
Glucose, Bld: 133 mg/dL — ABNORMAL HIGH (ref 70–99)
Potassium: 3.8 mmol/L (ref 3.5–5.1)
Sodium: 138 mmol/L (ref 135–145)
Total Bilirubin: 0.5 mg/dL (ref 0.3–1.2)
Total Protein: 6.9 g/dL (ref 6.5–8.1)

## 2018-09-14 LAB — CBC WITH DIFFERENTIAL/PLATELET
Abs Immature Granulocytes: 0.02 10*3/uL (ref 0.00–0.07)
Basophils Absolute: 0 10*3/uL (ref 0.0–0.1)
Basophils Relative: 1 %
Eosinophils Absolute: 0.2 10*3/uL (ref 0.0–0.5)
Eosinophils Relative: 2 %
HCT: 37.5 % (ref 36.0–46.0)
Hemoglobin: 12.1 g/dL (ref 12.0–15.0)
Immature Granulocytes: 0 %
Lymphocytes Relative: 49 %
Lymphs Abs: 3.9 10*3/uL (ref 0.7–4.0)
MCH: 31 pg (ref 26.0–34.0)
MCHC: 32.3 g/dL (ref 30.0–36.0)
MCV: 96.2 fL (ref 80.0–100.0)
Monocytes Absolute: 0.4 10*3/uL (ref 0.1–1.0)
Monocytes Relative: 5 %
Neutro Abs: 3.4 10*3/uL (ref 1.7–7.7)
Neutrophils Relative %: 43 %
Platelets: 231 10*3/uL (ref 150–400)
RBC: 3.9 MIL/uL (ref 3.87–5.11)
RDW: 12.4 % (ref 11.5–15.5)
WBC: 7.9 10*3/uL (ref 4.0–10.5)
nRBC: 0 % (ref 0.0–0.2)

## 2018-09-14 LAB — TROPONIN I: Troponin I: <0.03 ng/mL (ref <0.03)

## 2018-09-14 MED ORDER — FAMOTIDINE 40 MG PO TABS: 40.0000 mg | ORAL_TABLET | Freq: Every day | ORAL | 11 refills | Status: DC

## 2018-09-14 NOTE — Progress Notes (Signed)
Patient: Candace [name] Female    DOB: 1950-08-22   68 y.o.   MRN: [id number] Visit Date: 09/14/2018  Today's Provider: [name], MD   Chief Complaint  Patient presents with  . Shortness of Breath   Subjective:     Shortness of Breath This is a recurrent problem. The problem has been gradually worsening. Associated symptoms include chest pain, headaches and neck pain. Exacerbated by: dyspnea on exertion. She has tried nothing for the symptoms. Her past medical history is significant for CAD and chronic lung disease.  No fever or signs of covid. Daughter had covid but was away from pt for 1 month total quarantine. Symptoms are sometimes exrtional and some are at rest. She feels some anxiety. Allergies  Allergen Reactions  . Lisinopril     cough  . Penicillins Swelling, Rash and Other (See Comments)    Has patient had a PCN reaction causing immediate rash, facial/tongue/throat swelling, SOB or lightheadedness with hypotension: Yes Has patient had a PCN reaction causing severe rash involving mucus membranes or skin necrosis: Yes Has patient had a PCN reaction that required hospitalization No Has patient had a PCN reaction occurring within the last 10 years: Yes If all of the above answers are "NO", then may proceed with Cephalosporin use.      Current Outpatient Medications:  .  acetaminophen (TYLENOL) 500 MG tablet, Take 2 tablets (1,000 mg total) by mouth every 8 (eight) hours as needed., Disp: 60 tablet, Rfl: 1 .  atorvastatin (LIPITOR) 80 MG tablet, Take 80 mg by mouth daily., Disp: , Rfl:  .  blood glucose meter kit and supplies, Dispense based on patient and insurance preference. Use up to four times daily as directed. (FOR ICD-10 E10.9, E11.9)., Disp: 1 each, Rfl: 3 .  Blood Glucose Monitoring Suppl (ACCU-CHEK AVIVA PLUS) w/Device KIT, 1 Device by Does not apply route daily., Disp: 1 kit, Rfl: 0 .  clopidogrel (PLAVIX) 75 MG tablet, Take 75 mg by mouth  daily., Disp: , Rfl:  .  [employer] ASPIRIN ADULT LOW DOSE 81 MG chewable tablet, CHEW 1 TABLET (81 MG TOTAL) BY MOUTH DAILY. (Patient taking differently: Chew 81 mg by mouth daily. ), Disp: 90 tablet, Rfl: 3 .  cyproheptadine (PERIACTIN) 4 MG tablet, Take 4 mg by mouth 3 (three) times daily as needed for allergies., Disp: , Rfl:  .  furosemide (LASIX) 20 MG tablet, Take 20 mg by mouth every other day. , Disp: , Rfl:  .  glucose blood (ACCU-CHEK AVIVA PLUS) test strip, Check blood sugar once daily., Disp: 100 each, Rfl: 12 .  glucose blood test strip, Test fasting glucose daily with Contour Next One glucometer., Disp: 100 each, Rfl: 3 .  isosorbide mononitrate (IMDUR) 30 MG 24 hr tablet, Take 2 tablets (60 mg total) by mouth daily. (Patient taking differently: Take 30 mg by mouth daily. ), Disp: 60 tablet, Rfl: 12 .  Lancets (ONETOUCH DELICA PLUS [id number]) MISC, USE UP TO 4 TIMES DAILY AS DIRECTED, Disp: 100 each, Rfl: 0 .  loperamide (IMODIUM) 2 MG capsule, Take by mouth as needed for diarrhea or loose stools., Disp: , Rfl:  .  metFORMIN (GLUCOPHAGE) 1000 MG tablet, Take 1 tablet (1,000 mg total) by mouth 2 (two) times daily with a meal., Disp: 180 tablet, Rfl: 3 .  metoprolol tartrate (LOPRESSOR) 50 MG tablet, TAKE 1 TABLET BY MOUTH TWICE A DAY FOR HIGH BLOOD PRESSURE (Patient taking differently: Take 50 mg  by mouth 2 (two) times daily. ), Disp: 60 tablet, Rfl: 12 .  nitroGLYCERIN (NITROSTAT) 0.4 MG SL tablet, Place 1 tablet (0.4 mg total) under the tongue every 5 (five) minutes as needed for chest pain. (Patient not taking: Reported on 08/03/2018), Disp: 30 tablet, Rfl: 0 .  ondansetron (ZOFRAN-ODT) 8 MG disintegrating tablet, DISSOLVE 1 TABLET ON THE TONGUE EVERY 8 HOURS AS NEEDED FOR NAUSEA OR VOMITING, Disp: 30 tablet, Rfl: 1 .  ONE TOUCH ULTRA TEST test strip, USE UP TO 4 TIMES DAILY AS DIRECTED, Disp: 100 each, Rfl: 5 .  pantoprazole (PROTONIX) 20 MG tablet, Take 1 tablet (20 mg total) by mouth 2 (two)  times daily., Disp: 60 tablet, Rfl: 11 .  rosuvastatin (CRESTOR) 10 MG tablet, TAKE 1 TABLET BY MOUTH EVERY DAY (Patient not taking: No sig reported), Disp: 90 tablet, Rfl: 3 .  sucralfate (CARAFATE) 1 g tablet, Take 1 tablet (1 g total) by mouth 2 (two) times daily. (Patient not taking: Reported on 07/18/2018), Disp: 60 tablet, Rfl: 5 .  tiZANidine (ZANAFLEX) 2 MG tablet, Take 2 mg by mouth at bedtime. , Disp: , Rfl:  .  traMADol (ULTRAM) 50 MG tablet, Take 1 tablet (50 mg total) by mouth every 6 (six) hours as needed. (Patient not taking: Reported on 07/18/2018), Disp: 20 tablet, Rfl: 0  Review of Systems  Constitutional: Positive for appetite change and fatigue.  HENT: Negative.   Eyes: Negative.   Respiratory: Positive for chest tightness and shortness of breath. Negative for cough.   Cardiovascular: Positive for chest pain.  Gastrointestinal:       Some reflux.with belching.   Endocrine: Negative.   Musculoskeletal: Positive for neck pain. Negative for arthralgias and myalgias.  Skin: Negative.   Allergic/Immunologic: Negative.   Neurological: Positive for dizziness, weakness, light-headedness and headaches.  Psychiatric/Behavioral: The patient is nervous/anxious.     Social History   Tobacco Use  . Smoking status: Former Smoker    Packs/day: 0.50    Types: Cigarettes    Quit date: 10/07/2001    Years since quitting: 16.9  . Smokeless tobacco: Never Used  Substance Use Topics  . Alcohol use: No    Alcohol/week: 0.0 standard drinks      Objective:   There were no vitals taken for this visit. There were no vitals filed for this visit.   Physical Exam Vitals signs reviewed.  Constitutional:      Appearance: She is well-developed.  HENT:     Head: Normocephalic and atraumatic.     Right Ear: External ear normal.     Left Ear: External ear normal.     Nose: Nose normal.  Eyes:     General: No scleral icterus.    Conjunctiva/sclera: Conjunctivae normal.  Neck:      Thyroid: No thyromegaly.  Cardiovascular:     Rate and Rhythm: Normal rate and regular rhythm.     Heart sounds: Normal heart sounds.  Pulmonary:     Effort: Pulmonary effort is normal.     Breath sounds: Normal breath sounds.  Abdominal:     Palpations: Abdomen is soft.  Musculoskeletal:     Right lower leg: No edema.     Left lower leg: No edema.     Comments: No calf swelling or cords or edema.  Skin:    General: Skin is warm and dry.  Neurological:     Mental Status: She is alert and oriented to person, place, and time.  Psychiatric:  Mood and Affect: Mood normal.        Behavior: Behavior normal.        Thought Content: Thought content normal.        Judgment: Judgment normal.   ECG unchanged from [place]    1. Dyspnea on exertion Possible anginal equivalent.ECG unchanged. Check labs and refer back to cardiology - EKG 12-Lead - CBC with Differential/Platelet - Comprehensive metabolic panel - Troponin I - TSH - Ambulatory referral to Cardiology  2. Chest pain, unspecified type Also possible aggravated by GERD Add Pepcid.  3. Gastroesophageal reflux disease, esophagitis presence not specified  - famotidine (PEPCID) 40 MG tablet; Take 1 tablet (40 mg total) by mouth daily.  Dispense: 30 tablet; Refill: 11  4. Coronary artery disease of bypass graft of native heart with stable angina pectoris ([place])   5. Mild obstructive sleep apnea   6. Type 2 diabetes mellitus with other circulatory complication, without long-term current use of insulin ([place])   7. S/P CABG x 4   8. Depression with anxiety I think this is a contributor to symptoms.No change in meds but counseling might be appropriate.    I have done the exam and reviewed the above chart and it is accurate to the best of my knowledge. Development worker, community has been used in this note in any air is in the dictation or transcription are unintentional.  [name], MD   [facility]

## 2018-09-15 ENCOUNTER — Other Ambulatory Visit: Payer: Self-pay

## 2018-09-15 ENCOUNTER — Other Ambulatory Visit
Admission: RE | Admit: 2018-09-15 | Discharge: 2018-09-15 | Disposition: A | Discharge status: Home / Self Care | Payer: Medicare Other | Source: Ambulatory Visit | Attending: Internal Medicine | Admitting: Internal Medicine

## 2018-09-15 ENCOUNTER — Other Ambulatory Visit: Payer: Self-pay | Admitting: Physician Assistant

## 2018-09-15 DIAGNOSIS — Z1159 Encounter for screening for other viral diseases: Secondary | ICD-10-CM | POA: Diagnosis present

## 2018-09-16 LAB — NOVEL CORONAVIRUS, NAA (HOSP ORDER, SEND-OUT TO REF LAB; TAT 18-24 HRS): SARS-CoV-2, NAA: NOT DETECTED

## 2018-09-18 ENCOUNTER — Telehealth: Payer: Self-pay

## 2018-09-18 NOTE — Telephone Encounter (Signed)
-----   Message from [name]., MD sent at 09/15/2018  8:57 AM EDT ----- Labs OK

## 2018-09-18 NOTE — Telephone Encounter (Signed)
Patient was advised.  

## 2018-09-19 ENCOUNTER — Other Ambulatory Visit: Payer: Self-pay

## 2018-09-19 ENCOUNTER — Ambulatory Visit
Admission: RE | Admit: 2018-09-19 | Discharge: 2018-09-19 | Disposition: A | Discharge status: Home / Self Care | Payer: Medicare Other | Attending: Internal Medicine | Admitting: Internal Medicine

## 2018-09-19 ENCOUNTER — Encounter
Admission: RE | Disposition: A | Discharge status: Home / Self Care | Payer: Self-pay | Source: Home / Self Care | Attending: Internal Medicine

## 2018-09-19 DIAGNOSIS — Z951 Presence of aortocoronary bypass graft: Secondary | ICD-10-CM | POA: Insufficient documentation

## 2018-09-19 DIAGNOSIS — E1159 Type 2 diabetes mellitus with other circulatory complications: Secondary | ICD-10-CM | POA: Diagnosis not present

## 2018-09-19 DIAGNOSIS — Z880 Allergy status to penicillin: Secondary | ICD-10-CM | POA: Insufficient documentation

## 2018-09-19 DIAGNOSIS — Z79899 Other long term (current) drug therapy: Secondary | ICD-10-CM | POA: Insufficient documentation

## 2018-09-19 DIAGNOSIS — Z888 Allergy status to other drugs, medicaments and biological substances status: Secondary | ICD-10-CM | POA: Diagnosis not present

## 2018-09-19 DIAGNOSIS — F418 Other specified anxiety disorders: Secondary | ICD-10-CM | POA: Diagnosis not present

## 2018-09-19 DIAGNOSIS — G4733 Obstructive sleep apnea (adult) (pediatric): Secondary | ICD-10-CM | POA: Diagnosis not present

## 2018-09-19 DIAGNOSIS — Z87891 Personal history of nicotine dependence: Secondary | ICD-10-CM | POA: Insufficient documentation

## 2018-09-19 DIAGNOSIS — K219 Gastro-esophageal reflux disease without esophagitis: Secondary | ICD-10-CM | POA: Diagnosis not present

## 2018-09-19 DIAGNOSIS — I2510 Atherosclerotic heart disease of native coronary artery without angina pectoris: Secondary | ICD-10-CM | POA: Diagnosis not present

## 2018-09-19 DIAGNOSIS — Z7984 Long term (current) use of oral hypoglycemic drugs: Secondary | ICD-10-CM | POA: Insufficient documentation

## 2018-09-19 DIAGNOSIS — Z7902 Long term (current) use of antithrombotics/antiplatelets: Secondary | ICD-10-CM | POA: Diagnosis not present

## 2018-09-19 DIAGNOSIS — Z7982 Long term (current) use of aspirin: Secondary | ICD-10-CM | POA: Insufficient documentation

## 2018-09-19 DIAGNOSIS — R079 Chest pain, unspecified: Secondary | ICD-10-CM

## 2018-09-19 DIAGNOSIS — R0600 Dyspnea, unspecified: Secondary | ICD-10-CM | POA: Insufficient documentation

## 2018-09-19 DIAGNOSIS — Z955 Presence of coronary angioplasty implant and graft: Secondary | ICD-10-CM | POA: Insufficient documentation

## 2018-09-19 HISTORY — PX: LEFT HEART CATH AND CORS/GRAFTS ANGIOGRAPHY: CATH118250

## 2018-09-19 LAB — GLUCOSE, CAPILLARY: Glucose-Capillary: 144 mg/dL — ABNORMAL HIGH (ref 70–99)

## 2018-09-19 SURGERY — LEFT HEART CATH AND CORS/GRAFTS ANGIOGRAPHY
Anesthesia: Moderate Sedation

## 2018-09-19 MED ORDER — SODIUM CHLORIDE 0.9 % WEIGHT BASED INFUSION: 1.0000 mL/kg/h | INTRAVENOUS | Status: DC

## 2018-09-19 MED ORDER — MIDAZOLAM HCL 2 MG/2ML IJ SOLN
INTRAMUSCULAR | Status: DC | PRN
  Administered 2018-09-19: 1 mg via INTRAVENOUS

## 2018-09-19 MED ORDER — HEPARIN (PORCINE) IN NACL 1000-0.9 UT/500ML-% IV SOLN
INTRAVENOUS | Status: AC
  Filled 2018-09-19: qty 1000

## 2018-09-19 MED ORDER — SODIUM CHLORIDE 0.9% FLUSH: 3.0000 mL | Freq: Two times a day (BID) | INTRAVENOUS | Status: DC

## 2018-09-19 MED ORDER — LABETALOL HCL 5 MG/ML IV SOLN
INTRAVENOUS | Status: DC | PRN
  Administered 2018-09-19: 20 mg via INTRAVENOUS

## 2018-09-19 MED ORDER — FENTANYL CITRATE (PF) 100 MCG/2ML IJ SOLN
INTRAMUSCULAR | Status: DC | PRN
  Administered 2018-09-19: 50 ug via INTRAVENOUS

## 2018-09-19 MED ORDER — SODIUM CHLORIDE 0.9% FLUSH: 3.0000 mL | INTRAVENOUS | Status: DC | PRN

## 2018-09-19 MED ORDER — IOHEXOL 300 MG/ML  SOLN
INTRAMUSCULAR | Status: DC | PRN
  Administered 2018-09-19: 150 mL via INTRA_ARTERIAL

## 2018-09-19 MED ORDER — SODIUM CHLORIDE 0.9 % WEIGHT BASED INFUSION
3.0000 mL/kg/h | INTRAVENOUS | Status: AC
  Administered 2018-09-19: 3 mL/kg/h via INTRAVENOUS

## 2018-09-19 MED ORDER — LABETALOL HCL 5 MG/ML IV SOLN
INTRAVENOUS | Status: AC
  Filled 2018-09-19: qty 4

## 2018-09-19 MED ORDER — MIDAZOLAM HCL 2 MG/2ML IJ SOLN
INTRAMUSCULAR | Status: AC
  Filled 2018-09-19: qty 2

## 2018-09-19 MED ORDER — ONDANSETRON HCL 4 MG/2ML IJ SOLN: 4.0000 mg | Freq: Four times a day (QID) | INTRAMUSCULAR | Status: DC | PRN

## 2018-09-19 MED ORDER — HYDRALAZINE HCL 20 MG/ML IJ SOLN: 10.0000 mg | INTRAMUSCULAR | Status: DC | PRN

## 2018-09-19 MED ORDER — ACETAMINOPHEN 325 MG PO TABS: 650.0000 mg | ORAL_TABLET | ORAL | Status: DC | PRN

## 2018-09-19 MED ORDER — ASPIRIN 81 MG PO CHEW: 81.0000 mg | CHEWABLE_TABLET | ORAL | Status: DC

## 2018-09-19 MED ORDER — LABETALOL HCL 5 MG/ML IV SOLN: 10.0000 mg | INTRAVENOUS | Status: DC | PRN

## 2018-09-19 MED ORDER — FENTANYL CITRATE (PF) 100 MCG/2ML IJ SOLN
INTRAMUSCULAR | Status: AC
  Filled 2018-09-19: qty 2

## 2018-09-19 MED ORDER — SODIUM CHLORIDE 0.9 % IV SOLN: 250.0000 mL | INTRAVENOUS | Status: DC | PRN

## 2018-09-19 MED ORDER — HEPARIN (PORCINE) IN NACL 1000-0.9 UT/500ML-% IV SOLN
INTRAVENOUS | Status: DC | PRN
  Administered 2018-09-19: 500 mL

## 2018-09-19 SURGICAL SUPPLY — 12 items
CATH INFINITI 5 FR RCB (CATHETERS) ×1 IMPLANT
CATH INFINITI 5FR ANG PIGTAIL (CATHETERS) ×1 IMPLANT
CATH INFINITI 5FR JL4 (CATHETERS) ×1 IMPLANT
CATH INFINITI JR4 5F (CATHETERS) ×1 IMPLANT
DEVICE CLOSURE MYNXGRIP 5F (Vascular Products) ×1 IMPLANT
GUIDEWIRE EMER 3M J .025X150CM (WIRE) ×1 IMPLANT
KIT MANI 3VAL PERCEP (MISCELLANEOUS) ×3 IMPLANT
NDL PERC 18GX7CM (NEEDLE) IMPLANT
NEEDLE PERC 18GX7CM (NEEDLE) ×3 IMPLANT
PACK CARDIAC CATH (CUSTOM PROCEDURE TRAY) ×3 IMPLANT
SHEATH AVANTI 5FR X 11CM (SHEATH) ×1 IMPLANT
WIRE GUIDERIGHT .035X150 (WIRE) ×1 IMPLANT

## 2018-09-20 ENCOUNTER — Encounter: Payer: Self-pay | Admitting: Internal Medicine

## 2018-09-21 ENCOUNTER — Ambulatory Visit: Payer: Medicaid Other | Admitting: Family Medicine

## 2018-09-26 ENCOUNTER — Telehealth: Payer: Self-pay | Admitting: *Deleted

## 2018-09-26 NOTE — Telephone Encounter (Signed)
Clear fluids for today. Maybe ondansetron 4 mg q 8 hrs prn nausea.#30  Slowly advance diet tomorrow.

## 2018-09-26 NOTE — Telephone Encounter (Signed)
Patient called stating she woke up this morning, ate a banana and about 1 hour later she starting throwing up. Patient stated she threw up about 3 times and saw streaks of blood in the vomit and in her nose. Patient wanted to know want she should do? Patient has same symptoms 09/14/2018. Please advise?

## 2018-09-26 NOTE — Telephone Encounter (Signed)
Please advise 

## 2018-09-26 NOTE — Telephone Encounter (Signed)
Patient advised of clear liquid diet and to start Ondansetron. Patient states that she already has some of the medication at home.

## 2018-10-03 DIAGNOSIS — I471 Supraventricular tachycardia: Secondary | ICD-10-CM | POA: Insufficient documentation

## 2018-10-04 ENCOUNTER — Other Ambulatory Visit: Payer: Self-pay

## 2018-10-04 DIAGNOSIS — K219 Gastro-esophageal reflux disease without esophagitis: Secondary | ICD-10-CM

## 2018-10-04 DIAGNOSIS — K920 Hematemesis: Secondary | ICD-10-CM

## 2018-10-04 DIAGNOSIS — R112 Nausea with vomiting, unspecified: Secondary | ICD-10-CM

## 2018-10-04 DIAGNOSIS — R1013 Epigastric pain: Secondary | ICD-10-CM

## 2018-10-16 ENCOUNTER — Ambulatory Visit: Payer: Medicare Other | Admitting: Gastroenterology

## 2018-10-17 ENCOUNTER — Other Ambulatory Visit: Payer: Self-pay

## 2018-10-17 ENCOUNTER — Ambulatory Visit (INDEPENDENT_AMBULATORY_CARE_PROVIDER_SITE_OTHER): Payer: Medicare Other | Admitting: Gastroenterology

## 2018-10-17 ENCOUNTER — Encounter: Payer: Self-pay | Admitting: Gastroenterology

## 2018-10-17 VITALS — BP 160/103 | HR 111 | Temp 98.8°F | Wt 175.2 lb

## 2018-10-17 DIAGNOSIS — Z86010 Personal history of colonic polyps: Secondary | ICD-10-CM

## 2018-10-17 DIAGNOSIS — K529 Noninfective gastroenteritis and colitis, unspecified: Secondary | ICD-10-CM | POA: Diagnosis not present

## 2018-10-17 DIAGNOSIS — R1310 Dysphagia, unspecified: Secondary | ICD-10-CM

## 2018-10-17 DIAGNOSIS — R1319 Other dysphagia: Secondary | ICD-10-CM

## 2018-10-17 NOTE — Progress Notes (Signed)
Candace [name] [address]  [place]  [place], [place] [address]  Main: [phone]  Fax: [phone]   Gastroenterology Consultation  Referring Provider:     [name].,* Primary Care Physician:  [name]., MD Reason for Consultation:     Nausea vomiting, hematemesis        HPI:    Chief Complaint  Patient presents with  . New Patient (Initial Visit)    n/v, epigastric pain, GERD, Hematemesis    Candace [name] is a 68 y.o. y/o female referred for consultation & management  by Dr. Rosanna Randy, Retia Passe., MD.  Patient reports intermittent symptoms of dysphagia, about once or twice a month.  Sometimes she drinks water and reports it feels that it is stuck in her midepigastric region eventually passes, and sometimes she has to vomit it back up.  She notices blood streaks in the emesis at times as well.  Patient also reports chronic diarrhea which is been the same for years.  Patient has had previous endoscopies with [facility]  Last colonoscopy was in 2015 and 2 tubular adenoma colon polyps removed.  Colon biopsies did not show microscopic colitis.  2011 upper endoscopy for dysphagia with mild Schatzki's ring noted and dilated to 16 mm with savory dilator.  Duodenitis also reported  Subsequent upper endoscopies in 2013 and 2015 did not report any esophageal lesions.  Gastric erythema was reported and biopsies were negative for H. Pylori.  Past Medical History:  Diagnosis Date  . Allergy   . Anemia   . Anxiety   . Arrhythmia   . Arthritis   . Coronary artery disease   . Depression   . Diabetes mellitus without complication ([place])   . Dyspnea    doe  . Dysrhythmia   . GERD (gastroesophageal reflux disease)   . Headache   . History of hiatal hernia   . Hyperlipidemia   . Hypertension   . Myocardial infarction ([place])    2016, 04/2017  . Myocardial infarction with cardiac rehabilitation [facility])    MI 2016/ CABG 8/17    FINISHED  CARDIAC REHAB 3 WEEKS AGO  . Panic attack   . Pneumonia   . Reflux   . Stroke [facility]) 2015   showed up on MRI; no weakness noted  . TIA (transient ischemic attack)   . Voice tremor     Past Surgical History:  Procedure Laterality Date  . ABDOMINAL HYSTERECTOMY    . APPENDECTOMY  1975  . ARTERY BIOPSY Right 04/26/2016   Procedure: BIOPSY TEMPORAL ARTERY;  Surgeon: Margaretha Sheffield, MD;  Location: [facility] ORS;  Service: ENT;  Laterality: Right;  . CARDIAC CATHETERIZATION N/A 11/06/2015   Procedure: Left Heart Cath and Coronary Angiography;  Surgeon: Corey Skains, MD;  Location: [place] CV LAB;  Service: Cardiovascular;  Laterality: N/A;  . CESAREAN SECTION    . COLONOSCOPY  2015  . CORONARY ANGIOPLASTY  04/2017   [place]  . CORONARY ARTERY BYPASS GRAFT N/A 11/10/2015   Procedure: CORONARY ARTERY BYPASS GRAFTING (CABG), ON PUMP, TIMES FOUR, USING LEFT INTERNAL MAMMARY ARTERY, BILATERAL GREATER SAPHENOUS VEINS HARVESTED ENDOSCOPICALLY;  Surgeon: [name], MD;  Location: [place];  Service: Open Heart Surgery;  Laterality: N/A;  LIMA-LAD; SEQ SVG-OM1-OM2; SVG-PL  . CORONARY STENT INTERVENTION N/A 08/05/2016   Procedure: Coronary Stent Intervention;  Surgeon: Isaias Cowman, MD;  Location: [place] CV LAB;  Service: Cardiovascular;  Laterality: N/A;  .  HYSTERECTOMY ABDOMINAL WITH SALPINGO-OOPHORECTOMY Bilateral 08/15/2017   Procedure: HYSTERECTOMY ABDOMINAL WITH BILATERAL SALPINGO-OOPHORECTOMY;  Surgeon: Rubie Maid, MD;  Location: [facility] ORS;  Service: Gynecology;  Laterality: Bilateral;  . LEFT HEART CATH AND CORONARY ANGIOGRAPHY N/A 08/05/2016   Procedure: Left Heart Cath and Coronary Angiography;  Surgeon: Isaias Cowman, MD;  Location: [place] [facility] LAB;  Service: Cardiovascular;  Laterality: N/A;  . LEFT HEART CATH AND CORS/GRAFTS ANGIOGRAPHY N/A 09/19/2018   Procedure: LEFT HEART CATH AND CORS/GRAFTS ANGIOGRAPHY;  Surgeon: Corey Skains, MD;   Location: [place] CV LAB;  Service: Cardiovascular;  Laterality: N/A;  . OOPHORECTOMY    . TEE WITHOUT CARDIOVERSION N/A 11/10/2015   Procedure: TRANSESOPHAGEAL ECHOCARDIOGRAM (TEE);  Surgeon: [name], MD;  Location: [place];  Service: Open Heart Surgery;  Laterality: N/A;  . TUBAL LIGATION      Prior to Admission medications   Medication Sig Start Date End Date Taking? Authorizing Provider  acetaminophen (TYLENOL) 325 MG tablet Take 650 mg by mouth every 6 (six) hours as needed for moderate pain or headache.   Yes [provider]  atorvastatin (LIPITOR) 80 MG tablet Take 80 mg by mouth every evening.  07/06/18  Yes [provider]  clopidogrel (PLAVIX) 75 MG tablet Take 75 mg by mouth daily.   Yes [provider]  CVS ASPIRIN ADULT LOW DOSE 81 MG chewable tablet CHEW 1 TABLET (81 MG TOTAL) BY MOUTH DAILY. Patient taking differently: Chew 81 mg by mouth daily.  11/04/17  Yes [name]., MD  famotidine (PEPCID) 40 MG tablet Take 1 tablet (40 mg total) by mouth daily. Patient taking differently: Take 40 mg by mouth every evening.  09/14/18  Yes [name]., MD  furosemide (LASIX) 20 MG tablet Take 20 mg by mouth every other day.    Yes [provider]  isosorbide mononitrate (IMDUR) 30 MG 24 hr tablet Take 2 tablets (60 mg total) by mouth daily. Patient taking differently: Take 30 mg by mouth 2 (two) times a day.  11/04/16  Yes Plonk, Gwyndolyn Saxon, MD  loperamide (IMODIUM) 2 MG capsule Take 2-4 mg by mouth as needed for diarrhea or loose stools.    Yes [provider]  metFORMIN (GLUCOPHAGE) 1000 MG tablet Take 1 tablet (1,000 mg total) by mouth 2 (two) times daily with a meal. 02/02/18  Yes [name]., MD  metoprolol tartrate (LOPRESSOR) 50 MG tablet TAKE 1 TABLET BY MOUTH TWICE A DAY FOR HIGH BLOOD PRESSURE Patient taking differently: Take 75 mg by mouth 2 (two) times daily.  02/24/17  Yes [name]., MD   nitroGLYCERIN (NITROSTAT) 0.4 MG SL tablet Place 1 tablet (0.4 mg total) under the tongue every 5 (five) minutes as needed for chest pain. 07/27/16  Yes Mody, Sital, MD  ondansetron (ZOFRAN-ODT) 8 MG disintegrating tablet DISSOLVE 1 TABLET ON THE TONGUE EVERY 8 HOURS AS NEEDED FOR NAUSEA OR VOMITING Patient taking differently: Take 8 mg by mouth every 8 (eight) hours as needed for nausea or vomiting.  07/18/18  Yes [name]., MD  pantoprazole (PROTONIX) 20 MG tablet Take 1 tablet (20 mg total) by mouth 2 (two) times daily. 07/18/18  Yes [name]., MD    Family History  Problem Relation Age of Onset  . Cancer Father   . Hypertension Father   . Heart disease Father   . Asthma Father   . Cancer Mother   . Hypertension  Mother   . Pancreatic cancer Mother 60  . Cancer Sister   . Breast cancer Sister 64  . Breast cancer Sister 70  . Lung cancer Brother   . Pancreatic cancer Sister 16  . Cancer Sister      Social History   Tobacco Use  . Smoking status: Former Smoker    Packs/day: 0.50    Types: Cigarettes    Quit date: 10/07/2001    Years since quitting: 17.0  . Smokeless tobacco: Never Used  Substance Use Topics  . Alcohol use: No    Alcohol/week: 0.0 standard drinks  . Drug use: No    Allergies as of 10/17/2018 - Review Complete 10/17/2018  Allergen Reaction Noted  . Lisinopril Cough 02/07/2017  . Penicillins Swelling, Rash, and Other (See Comments) 11/05/2015    Review of Systems:    All systems reviewed and negative except where noted in HPI.   Physical Exam:  BP (!) 160/103 Comment: pt has not taken her medications for her B/P  Pulse (!) 111   Temp 98.8 F (37.1 C) (Oral)   Wt 175 lb 3.2 oz (79.5 kg)   BMI 28.28 kg/m  No LMP recorded. Patient has had a hysterectomy. Psych:  Alert and cooperative. Normal mood and affect. General:   Alert,  Well-developed, well-nourished, pleasant and cooperative in NAD Head:  Normocephalic and atraumatic.  Eyes:  Sclera clear, no icterus.   Conjunctiva pink. Ears:  Normal auditory acuity. Nose:  No deformity, discharge, or lesions. Mouth:  No deformity or lesions,oropharynx pink & moist. Neck:  Supple; no masses or thyromegaly. Abdomen:  Normal bowel sounds.  No bruits.  Soft, non-tender and non-distended without masses, hepatosplenomegaly or hernias noted.  No guarding or rebound tenderness.    Msk:  Symmetrical without gross deformities. Good, equal movement & strength bilaterally. Pulses:  Normal pulses noted. Extremities:  No clubbing or edema.  No cyanosis. Neurologic:  Alert and oriented x3;  grossly normal neurologically. Skin:  Intact without significant lesions or rashes. No jaundice. Lymph Nodes:  No significant cervical adenopathy. Psych:  Alert and cooperative. Normal mood and affect.   Labs: CBC    Component Value Date/Time   WBC 7.9 09/14/2018 1704   RBC 3.90 09/14/2018 1704   HGB 12.1 09/14/2018 1704   HGB 13.5 07/18/2018 1114   HCT 37.5 09/14/2018 1704   HCT 38.7 07/18/2018 1114   PLT 231 09/14/2018 1704   PLT 215 07/18/2018 1114   MCV 96.2 09/14/2018 1704   MCV 91 07/18/2018 1114   MCV 94 04/29/2014 2042   MCH 31.0 09/14/2018 1704   MCHC 32.3 09/14/2018 1704   RDW 12.4 09/14/2018 1704   RDW 13.1 07/18/2018 1114   RDW 13.0 04/29/2014 2042   LYMPHSABS 3.9 09/14/2018 1704   LYMPHSABS 3.1 07/18/2018 1114   LYMPHSABS 2.7 09/16/2013 0523   MONOABS 0.4 09/14/2018 1704   MONOABS 0.4 09/16/2013 0523   EOSABS 0.2 09/14/2018 1704   EOSABS 0.2 07/18/2018 1114   EOSABS 0.1 09/16/2013 0523   BASOSABS 0.0 09/14/2018 1704   BASOSABS 0.0 07/18/2018 1114   BASOSABS 0.0 09/16/2013 0523   CMP     Component Value Date/Time   NA 138 09/14/2018 1704   NA 144 01/12/2018 1014   NA 137 04/29/2014 2042   K 3.8 09/14/2018 1704   K 3.5 04/29/2014 2042   CL 100 09/14/2018 1704   CL 103 04/29/2014 2042   CO2 26 09/14/2018 1704   CO2 24 04/29/2014  2042   GLUCOSE 133 (H)  09/14/2018 1704   GLUCOSE 249 (H) 04/29/2014 2042   BUN 13 09/14/2018 1704   BUN 13 01/12/2018 1014   BUN 10 04/29/2014 2042   CREATININE 0.85 09/14/2018 1704   CREATININE 1.39 (H) 03/24/2017 1638   CALCIUM 8.7 (L) 09/14/2018 1704   CALCIUM 9.8 04/29/2014 2042   PROT 6.9 09/14/2018 1704   PROT 6.9 01/12/2018 1014   PROT 7.6 04/29/2014 2042   ALBUMIN 4.1 09/14/2018 1704   ALBUMIN 4.5 01/12/2018 1014   ALBUMIN 4.0 04/29/2014 2042   AST 16 09/14/2018 1704   AST 18 04/29/2014 2042   ALT 11 09/14/2018 1704   ALT 22 04/29/2014 2042   ALKPHOS 121 09/14/2018 1704   ALKPHOS 112 04/29/2014 2042   BILITOT 0.5 09/14/2018 1704   BILITOT 0.3 01/12/2018 1014   BILITOT 0.2 04/29/2014 2042   GFRNONAA >60 09/14/2018 1704   GFRNONAA >60 04/29/2014 2042   GFRNONAA >60 09/16/2013 0523   GFRAA >60 09/14/2018 1704   GFRAA >60 04/29/2014 2042   GFRAA >60 09/16/2013 0523    Imaging Studies: No results found.  Assessment and Plan:   Candace [name] is a 68 y.o. y/o female has been referred for dysphagia  EGD indicated to evaluate for etiology of dysphagia, possible Schatzki ring, strictures or narrowing Soft diet recommended and her procedure We also discussed the possible motility study may be needed if EGD and biopsies are unrevealing of cause of her intermittent dysphagia  I suspect that the blood streaks in her emesis have to do with possible irritation or esophagitis at the site where the food is going down if there is a stricture or narrowing  She is also due for polyp surveillance colonoscopy and can obtain biopsies for microscopic colitis given her chronic diarrhea as well  I have discussed alternative options, risks & benefits,  which include, but are not limited to, bleeding, infection, perforation,respiratory complication & drug reaction.  The patient agrees with this plan & written consent will be obtained.       Dr Candace [name]  Speech recognition software was used to  dictate the above note.

## 2018-10-30 ENCOUNTER — Telehealth: Payer: Self-pay

## 2018-10-30 NOTE — Telephone Encounter (Signed)
Advised daughter as below.  

## 2018-10-30 NOTE — Telephone Encounter (Signed)
Try Mag ox BID if not taking.

## 2018-10-30 NOTE — Telephone Encounter (Signed)
Patient is experiencing muscle cramps. She is wanting to know should she be taking potassium supplements? Please advise. Thanks!

## 2018-10-31 ENCOUNTER — Other Ambulatory Visit: Payer: Self-pay

## 2018-10-31 ENCOUNTER — Other Ambulatory Visit
Admission: RE | Admit: 2018-10-31 | Discharge: 2018-10-31 | Disposition: A | Discharge status: Home / Self Care | Payer: Medicare Other | Source: Ambulatory Visit | Attending: Gastroenterology | Admitting: Gastroenterology

## 2018-10-31 DIAGNOSIS — Z20828 Contact with and (suspected) exposure to other viral communicable diseases: Secondary | ICD-10-CM | POA: Diagnosis not present

## 2018-10-31 DIAGNOSIS — Z01812 Encounter for preprocedural laboratory examination: Secondary | ICD-10-CM | POA: Diagnosis present

## 2018-11-01 LAB — SARS CORONAVIRUS 2 (TAT 6-24 HRS): SARS Coronavirus 2: NEGATIVE

## 2018-11-03 ENCOUNTER — Other Ambulatory Visit: Payer: Self-pay

## 2018-11-03 ENCOUNTER — Ambulatory Visit: Payer: Medicare Other | Admitting: Anesthesiology

## 2018-11-03 ENCOUNTER — Ambulatory Visit
Admission: RE | Admit: 2018-11-03 | Discharge: 2018-11-03 | Disposition: A | Discharge status: Home / Self Care | Payer: Medicare Other | Attending: Gastroenterology | Admitting: Gastroenterology

## 2018-11-03 ENCOUNTER — Encounter
Admission: RE | Disposition: A | Discharge status: Home / Self Care | Payer: Self-pay | Source: Home / Self Care | Attending: Gastroenterology

## 2018-11-03 DIAGNOSIS — K228 Other specified diseases of esophagus: Secondary | ICD-10-CM | POA: Diagnosis not present

## 2018-11-03 DIAGNOSIS — I10 Essential (primary) hypertension: Secondary | ICD-10-CM | POA: Insufficient documentation

## 2018-11-03 DIAGNOSIS — K2289 Other specified disease of esophagus: Secondary | ICD-10-CM

## 2018-11-03 DIAGNOSIS — Z86010 Personal history of colonic polyps: Secondary | ICD-10-CM

## 2018-11-03 DIAGNOSIS — K5730 Diverticulosis of large intestine without perforation or abscess without bleeding: Secondary | ICD-10-CM | POA: Diagnosis not present

## 2018-11-03 DIAGNOSIS — Z79899 Other long term (current) drug therapy: Secondary | ICD-10-CM | POA: Insufficient documentation

## 2018-11-03 DIAGNOSIS — Z951 Presence of aortocoronary bypass graft: Secondary | ICD-10-CM | POA: Insufficient documentation

## 2018-11-03 DIAGNOSIS — Z955 Presence of coronary angioplasty implant and graft: Secondary | ICD-10-CM | POA: Insufficient documentation

## 2018-11-03 DIAGNOSIS — Z7902 Long term (current) use of antithrombotics/antiplatelets: Secondary | ICD-10-CM | POA: Diagnosis not present

## 2018-11-03 DIAGNOSIS — E119 Type 2 diabetes mellitus without complications: Secondary | ICD-10-CM | POA: Insufficient documentation

## 2018-11-03 DIAGNOSIS — I252 Old myocardial infarction: Secondary | ICD-10-CM | POA: Diagnosis not present

## 2018-11-03 DIAGNOSIS — M1990 Unspecified osteoarthritis, unspecified site: Secondary | ICD-10-CM | POA: Diagnosis not present

## 2018-11-03 DIAGNOSIS — R1319 Other dysphagia: Secondary | ICD-10-CM

## 2018-11-03 DIAGNOSIS — K3189 Other diseases of stomach and duodenum: Secondary | ICD-10-CM

## 2018-11-03 DIAGNOSIS — R1310 Dysphagia, unspecified: Secondary | ICD-10-CM | POA: Insufficient documentation

## 2018-11-03 DIAGNOSIS — Z87891 Personal history of nicotine dependence: Secondary | ICD-10-CM | POA: Insufficient documentation

## 2018-11-03 DIAGNOSIS — K317 Polyp of stomach and duodenum: Secondary | ICD-10-CM | POA: Diagnosis not present

## 2018-11-03 DIAGNOSIS — Z8673 Personal history of transient ischemic attack (TIA), and cerebral infarction without residual deficits: Secondary | ICD-10-CM | POA: Diagnosis not present

## 2018-11-03 DIAGNOSIS — D123 Benign neoplasm of transverse colon: Secondary | ICD-10-CM | POA: Diagnosis not present

## 2018-11-03 DIAGNOSIS — K219 Gastro-esophageal reflux disease without esophagitis: Secondary | ICD-10-CM | POA: Insufficient documentation

## 2018-11-03 DIAGNOSIS — I2510 Atherosclerotic heart disease of native coronary artery without angina pectoris: Secondary | ICD-10-CM | POA: Insufficient documentation

## 2018-11-03 DIAGNOSIS — Z1211 Encounter for screening for malignant neoplasm of colon: Secondary | ICD-10-CM | POA: Insufficient documentation

## 2018-11-03 DIAGNOSIS — Z7982 Long term (current) use of aspirin: Secondary | ICD-10-CM | POA: Diagnosis not present

## 2018-11-03 DIAGNOSIS — J449 Chronic obstructive pulmonary disease, unspecified: Secondary | ICD-10-CM | POA: Insufficient documentation

## 2018-11-03 DIAGNOSIS — K635 Polyp of colon: Secondary | ICD-10-CM

## 2018-11-03 HISTORY — PX: ESOPHAGOGASTRODUODENOSCOPY (EGD) WITH PROPOFOL: SHX5813

## 2018-11-03 HISTORY — PX: COLONOSCOPY WITH PROPOFOL: SHX5780

## 2018-11-03 SURGERY — COLONOSCOPY WITH PROPOFOL
Anesthesia: General

## 2018-11-03 MED ORDER — EPHEDRINE SULFATE 50 MG/ML IJ SOLN
INTRAMUSCULAR | Status: AC
  Filled 2018-11-03: qty 2

## 2018-11-03 MED ORDER — SODIUM CHLORIDE 0.9 % IV SOLN
INTRAVENOUS | Status: DC
  Administered 2018-11-03: 08:00:00 via INTRAVENOUS

## 2018-11-03 MED ORDER — GLYCOPYRROLATE 0.2 MG/ML IJ SOLN
INTRAMUSCULAR | Status: AC
  Filled 2018-11-03: qty 1

## 2018-11-03 MED ORDER — GLYCOPYRROLATE 0.2 MG/ML IJ SOLN
INTRAMUSCULAR | Status: DC | PRN
  Administered 2018-11-03: 0.2 mg via INTRAVENOUS

## 2018-11-03 MED ORDER — PROPOFOL 10 MG/ML IV BOLUS
INTRAVENOUS | Status: DC | PRN
  Administered 2018-11-03 (×11): 20 mg via INTRAVENOUS
  Administered 2018-11-03: 30 mg via INTRAVENOUS
  Administered 2018-11-03: 20 mg via INTRAVENOUS
  Administered 2018-11-03: 30 mg via INTRAVENOUS

## 2018-11-03 MED ORDER — PHENYLEPHRINE HCL (PRESSORS) 10 MG/ML IV SOLN
INTRAVENOUS | Status: AC
  Filled 2018-11-03: qty 1

## 2018-11-03 MED ORDER — EPHEDRINE SULFATE 50 MG/ML IJ SOLN
INTRAMUSCULAR | Status: DC | PRN
  Administered 2018-11-03 (×2): 5 mg via INTRAVENOUS

## 2018-11-03 MED ORDER — LIDOCAINE HCL (PF) 2 % IJ SOLN
INTRAMUSCULAR | Status: AC
  Filled 2018-11-03: qty 10

## 2018-11-03 MED ORDER — PHENYLEPHRINE HCL (PRESSORS) 10 MG/ML IV SOLN
INTRAVENOUS | Status: DC | PRN
  Administered 2018-11-03: 100 ug via INTRAVENOUS

## 2018-11-03 MED ORDER — PROPOFOL 10 MG/ML IV BOLUS
INTRAVENOUS | Status: AC
  Filled 2018-11-03: qty 20

## 2018-11-03 MED ORDER — PROPOFOL 500 MG/50ML IV EMUL
INTRAVENOUS | Status: AC
  Filled 2018-11-03: qty 250

## 2018-11-03 MED ORDER — LIDOCAINE HCL (CARDIAC) PF 100 MG/5ML IV SOSY
PREFILLED_SYRINGE | INTRAVENOUS | Status: DC | PRN
  Administered 2018-11-03: 100 mg via INTRATRACHEAL

## 2018-11-03 NOTE — Anesthesia Postprocedure Evaluation (Signed)
Anesthesia Post Note  Patient: Candace [name]  Procedure(s) Performed: COLONOSCOPY WITH PROPOFOL (N/A ) ESOPHAGOGASTRODUODENOSCOPY (EGD) WITH PROPOFOL (N/A )  Patient location during evaluation: Endoscopy Anesthesia Type: General Level of consciousness: awake and alert and oriented Pain management: pain level controlled Vital Signs Assessment: post-procedure vital signs reviewed and stable Respiratory status: spontaneous breathing, nonlabored ventilation and respiratory function stable Cardiovascular status: blood pressure returned to baseline and stable Postop Assessment: no signs of nausea or vomiting Anesthetic complications: no     Last Vitals:  Vitals:   11/03/18 0935 11/03/18 0945  BP: 138/77 (!) 153/78  Pulse: 76 81  Resp: 14 17  Temp:    SpO2: 99% 98%    Last Pain:  Vitals:   11/03/18 0945  TempSrc:   PainSc: 0-No pain                 Briceson Broadwater

## 2018-11-03 NOTE — Anesthesia Preprocedure Evaluation (Signed)
Anesthesia Evaluation  Patient identified by MRN, date of birth, ID band Patient awake    Reviewed: Allergy & Precautions, NPO status , Patient's Chart, lab work & pertinent test results  History of Anesthesia Complications (+) PROLONGED EMERGENCE, Emergence Delirium and history of anesthetic complications  Airway Mallampati: II  TM Distance: >3 FB Neck ROM: Full    Dental  (+) Upper Dentures   Pulmonary asthma , sleep apnea , COPD,  COPD inhaler, former smoker,    breath sounds clear to auscultation- rhonchi (-) wheezing      Cardiovascular hypertension, (-) angina+ CAD, + Past MI, + Cardiac Stents (2018) and + CABG (2017)  + dysrhythmias  Rhythm:Regular Rate:Normal - Systolic murmurs and - Diastolic murmurs    Neuro/Psych  Headaches, PSYCHIATRIC DISORDERS Anxiety Depression TIACVA, No Residual Symptoms    GI/Hepatic Neg liver ROS, hiatal hernia, GERD  ,  Endo/Other  diabetes, Oral Hypoglycemic Agents  Renal/GU negative Renal ROS     Musculoskeletal  (+) Arthritis ,   Abdominal (+) + obese,   Peds  Hematology  (+) anemia ,   Anesthesia Other Findings Past Medical History: No date: Allergy No date: Anemia No date: Anxiety No date: Arrhythmia No date: Arthritis No date: Coronary artery disease No date: Depression No date: Diabetes mellitus without complication ([facility]) No date: Dyspnea     Comment:  doe No date: Dysrhythmia No date: GERD (gastroesophageal reflux disease) No date: Headache No date: History of hiatal hernia No date: Hyperlipidemia No date: Hypertension No date: Myocardial infarction [facility])     Comment:  2016, 04/2017 No date: Myocardial infarction with cardiac rehabilitation [facility])     Comment:  MI 2016/ CABG 8/17    FINISHED CARDIAC REHAB 3 WEEKS AGO No date: Panic attack No date: Pneumonia No date: Reflux 2015: Stroke ([place])     Comment:  showed up on MRI; no weakness noted No date: TIA  (transient ischemic attack) No date: Voice tremor   Reproductive/Obstetrics                             Anesthesia Physical Anesthesia Plan  ASA: III  Anesthesia Plan: General   Post-op Pain Management:    Induction: Intravenous  PONV Risk Score and Plan: 2 and Propofol infusion  Airway Management Planned: Natural Airway  Additional Equipment:   Intra-op Plan:   Post-operative Plan:   Informed Consent: I have reviewed the patients History and Physical, chart, labs and discussed the procedure including the risks, benefits and alternatives for the proposed anesthesia with the patient or authorized representative who has indicated his/her understanding and acceptance.     Dental advisory given  Plan Discussed with: CRNA and Anesthesiologist  Anesthesia Plan Comments:         Anesthesia Quick Evaluation

## 2018-11-03 NOTE — Transfer of Care (Signed)
Immediate Anesthesia Transfer of Care Note  Patient: Candace [name]  Procedure(s) Performed: COLONOSCOPY WITH PROPOFOL (N/A ) ESOPHAGOGASTRODUODENOSCOPY (EGD) WITH PROPOFOL (N/A )  Patient Location: Endoscopy Unit  Anesthesia Type:General  Level of Consciousness: awake, alert  and patient cooperative  Airway & Oxygen Therapy: Patient Spontanous Breathing and Patient connected to face mask oxygen  Post-op Assessment: Report given to RN and Post -op Vital signs reviewed and stable  Post vital signs: Reviewed and stable  Last Vitals:  Vitals Value Taken Time  BP 138/79 11/03/18 0915  Temp    Pulse 87 11/03/18 0915  Resp 20 11/03/18 0915  SpO2 100 % 11/03/18 0915  Vitals shown include unvalidated device data.  Last Pain:  Vitals:   11/03/18 0721  TempSrc: Tympanic  PainSc: 0-No pain         Complications: No apparent anesthesia complications

## 2018-11-03 NOTE — Op Note (Signed)
[facility]astroenterology Patient Name: Candace [name] Procedure Date: 11/03/2018 7:58 AM MRN: [id number] Account #: [id number] Date of Birth: 05/12/1950 Admit Type: Outpatient Age: 68 Room: [facility] ENDO ROOM 1 Gender: Female Note Status: Finalized Procedure:            Colonoscopy Indications:          High risk colon cancer surveillance: Personal history                        of colonic polyps Providers:            Lanisha Stepanian B. Bonna Gains MD, MD Referring MD:         Janine Ores. Rosanna Randy, MD (Referring MD) Medicines:            Monitored Anesthesia Care Complications:        No immediate complications. Procedure:            Pre-Anesthesia Assessment:                       - ASA Grade Assessment: II - A patient with mild                        systemic disease.                       - Prior to the procedure, a History and Physical was                        performed, and patient medications, allergies and                        sensitivities were reviewed. The patient's tolerance of                        previous anesthesia was reviewed.                       - The risks and benefits of the procedure and the                        sedation options and risks were discussed with the                        patient. All questions were answered and informed                        consent was obtained.                       - Patient identification and proposed procedure were                        verified prior to the procedure by the physician, the                        nurse, the anesthesiologist, the anesthetist and the                        technician. The procedure was verified in the procedure  room.                       After obtaining informed consent, the colonoscope was                        passed under direct vision. Throughout the procedure,                        the patient's blood pressure, pulse, and oxygen      saturations were monitored continuously. The                        Colonoscope was introduced through the anus and                        advanced to the the cecum, identified by appendiceal                        orifice and ileocecal valve. The colonoscopy was                        performed with ease. The patient tolerated the                        procedure well. The quality of the bowel preparation                        was fair except the sigmoid colon was poor. Findings:      The perianal and digital rectal examinations were normal.      Two sessile polyps were found in the transverse colon. The polyps were 3       to 4 mm in size. These polyps were removed with a jumbo cold forceps.       Resection and retrieval were complete.      Multiple diverticula were found in the sigmoid colon.      The exam was otherwise without abnormality.      The rectum, sigmoid colon, descending colon, transverse colon, ascending       colon and cecum appeared normal.      Anal papilla(e) were hypertrophied.      The retroflexed view of the distal rectum and anal verge was normal and       showed no anal or rectal abnormalities. Impression:           - Two 3 to 4 mm polyps in the transverse colon, removed                        with a jumbo cold forceps. Resected and retrieved.                       - Diverticulosis in the sigmoid colon.                       - The examination was otherwise normal.                       - The rectum, sigmoid colon, descending colon,                        transverse colon, ascending colon  and cecum are normal.                       - Anal papilla(e) were hypertrophied.                       - The distal rectum and anal verge are normal on                        retroflexion view.                       - Prep was fair to poor as described above. Large                        vegetable matter in the sigmoid colon that could not be                         suctioned. Recommendation:       - Discharge patient to home (with escort).                       - High fiber diet.                       - Advance diet as tolerated.                       - Continue present medications.                       - Await pathology results.                       - Repeat colonoscopy in 1 year, with 2 day prep.                       - The findings and recommendations were discussed with                        the patient.                       - The findings and recommendations were discussed with                        the patient's family.                       - Return to primary care physician as previously                        scheduled. Procedure Code(s):    --- Professional ---                       [phone], Colonoscopy, flexible; with biopsy, single or                        multiple Diagnosis Code(s):    --- Professional ---                       Z86.010, Personal history of colonic polyps  K63.5, Polyp of colon                       K57.30, Diverticulosis of large intestine without                        perforation or abscess without bleeding CPT copyright 2019 American Medical Association. All rights reserved. The codes documented in this report are preliminary and upon coder review may  be revised to meet current compliance requirements.  [name], MD Margretta Sidle B. Bonna Gains MD, MD 11/03/2018 9:20:47 AM This report has been signed electronically. Number of Addenda: 0 Note Initiated On: 11/03/2018 7:58 AM Scope Withdrawal Time: 0 hours 23 minutes 32 seconds  Total Procedure Duration: 0 hours 29 minutes 7 seconds  Estimated Blood Loss: Estimated blood loss: none.      [facility]

## 2018-11-03 NOTE — Consult Note (Signed)
[name], MD [address], [place], [place], [place], [address] [address], [place], [place], [place], [address] Phone: [phone]  Fax: [phone]  Primary Care Physician:  [name]., MD   Pre-Procedure History & Physical: HPI:  Candace [name] is a 68 y.o. female is here for a colonoscopy and EGD.   Past Medical History:  Diagnosis Date  . Allergy   . Anemia   . Anxiety   . Arrhythmia   . Arthritis   . Coronary artery disease   . Depression   . Diabetes mellitus without complication ([place])   . Dyspnea    doe  . Dysrhythmia   . GERD (gastroesophageal reflux disease)   . Headache   . History of hiatal hernia   . Hyperlipidemia   . Hypertension   . Myocardial infarction ([place])    2016, 04/2017  . Myocardial infarction with cardiac rehabilitation [facility])    MI 2016/ CABG 8/17    FINISHED CARDIAC REHAB 3 WEEKS AGO  . Panic attack   . Pneumonia   . Reflux   . Stroke [facility]) 2015   showed up on MRI; no weakness noted  . TIA (transient ischemic attack)   . Voice tremor     Past Surgical History:  Procedure Laterality Date  . ABDOMINAL HYSTERECTOMY    . APPENDECTOMY  1975  . ARTERY BIOPSY Right 04/26/2016   Procedure: BIOPSY TEMPORAL ARTERY;  Surgeon: Margaretha Sheffield, MD;  Location: [facility] ORS;  Service: ENT;  Laterality: Right;  . CARDIAC CATHETERIZATION N/A 11/06/2015   Procedure: Left Heart Cath and Coronary Angiography;  Surgeon: Corey Skains, MD;  Location: [place] CV LAB;  Service: Cardiovascular;  Laterality: N/A;  . CESAREAN SECTION    . COLONOSCOPY  2015  . CORONARY ANGIOPLASTY  04/2017   [place]  . CORONARY ARTERY BYPASS GRAFT N/A 11/10/2015   Procedure: CORONARY ARTERY BYPASS GRAFTING (CABG), ON PUMP, TIMES FOUR, USING LEFT INTERNAL MAMMARY ARTERY, BILATERAL GREATER SAPHENOUS VEINS HARVESTED ENDOSCOPICALLY;  Surgeon: [name], MD;  Location: [place];  Service: Open Heart Surgery;  Laterality: N/A;   LIMA-LAD; SEQ SVG-OM1-OM2; SVG-PL  . CORONARY STENT INTERVENTION N/A 08/05/2016   Procedure: Coronary Stent Intervention;  Surgeon: Isaias Cowman, MD;  Location: [place] CV LAB;  Service: Cardiovascular;  Laterality: N/A;  . HYSTERECTOMY ABDOMINAL WITH SALPINGO-OOPHORECTOMY Bilateral 08/15/2017   Procedure: HYSTERECTOMY ABDOMINAL WITH BILATERAL SALPINGO-OOPHORECTOMY;  Surgeon: Rubie Maid, MD;  Location: [facility] ORS;  Service: Gynecology;  Laterality: Bilateral;  . LEFT HEART CATH AND CORONARY ANGIOGRAPHY N/A 08/05/2016   Procedure: Left Heart Cath and Coronary Angiography;  Surgeon: Isaias Cowman, MD;  Location: [place] CV LAB;  Service: Cardiovascular;  Laterality: N/A;  . LEFT HEART CATH AND CORS/GRAFTS ANGIOGRAPHY N/A 09/19/2018   Procedure: LEFT HEART CATH AND CORS/GRAFTS ANGIOGRAPHY;  Surgeon: Corey Skains, MD;  Location: [place] CV LAB;  Service: Cardiovascular;  Laterality: N/A;  . OOPHORECTOMY    . TEE WITHOUT CARDIOVERSION N/A 11/10/2015   Procedure: TRANSESOPHAGEAL ECHOCARDIOGRAM (TEE);  Surgeon: [name], MD;  Location: [place];  Service: Open Heart Surgery;  Laterality: N/A;  . TUBAL LIGATION      Prior to Admission medications   Medication Sig Start Date End Date Taking? Authorizing Provider  atorvastatin (LIPITOR) 80 MG tablet Take 80 mg by mouth every evening.  07/06/18  Yes [provider]  [facility] ASPIRIN ADULT LOW DOSE 81 MG chewable tablet CHEW 1 TABLET (  81 MG TOTAL) BY MOUTH DAILY. Patient taking differently: Chew 81 mg by mouth daily.  11/04/17  Yes [name]., MD  famotidine (PEPCID) 40 MG tablet Take 1 tablet (40 mg total) by mouth daily. Patient taking differently: Take 40 mg by mouth every evening.  09/14/18  Yes [name]., MD  furosemide (LASIX) 20 MG tablet Take 20 mg by mouth every other day.    Yes [provider]  isosorbide mononitrate (IMDUR) 30 MG 24 hr tablet Take 2 tablets (60 mg total)  by mouth daily. Patient taking differently: Take 30 mg by mouth 2 (two) times a day.  11/04/16  Yes Plonk, Gwyndolyn Saxon, MD  metFORMIN (GLUCOPHAGE) 1000 MG tablet Take 1 tablet (1,000 mg total) by mouth 2 (two) times daily with a meal. 02/02/18  Yes [name]., MD  metoprolol tartrate (LOPRESSOR) 50 MG tablet TAKE 1 TABLET BY MOUTH TWICE A DAY FOR HIGH BLOOD PRESSURE Patient taking differently: Take 75 mg by mouth 2 (two) times daily.  02/24/17  Yes [name]., MD  pantoprazole (PROTONIX) 20 MG tablet Take 1 tablet (20 mg total) by mouth 2 (two) times daily. 07/18/18  Yes [name]., MD  acetaminophen (TYLENOL) 325 MG tablet Take 650 mg by mouth every 6 (six) hours as needed for moderate pain or headache.    [provider]  clopidogrel (PLAVIX) 75 MG tablet Take 75 mg by mouth daily.    [provider]  loperamide (IMODIUM) 2 MG capsule Take 2-4 mg by mouth as needed for diarrhea or loose stools.     [provider]  nitroGLYCERIN (NITROSTAT) 0.4 MG SL tablet Place 1 tablet (0.4 mg total) under the tongue every 5 (five) minutes as needed for chest pain. 07/27/16   Bettey Costa, MD  ondansetron (ZOFRAN-ODT) 8 MG disintegrating tablet DISSOLVE 1 TABLET ON THE TONGUE EVERY 8 HOURS AS NEEDED FOR NAUSEA OR VOMITING Patient taking differently: Take 8 mg by mouth every 8 (eight) hours as needed for nausea or vomiting.  07/18/18   [name]., MD    Allergies as of 10/17/2018 - Review Complete 10/17/2018  Allergen Reaction Noted  . Lisinopril Cough 02/07/2017  . Penicillins Swelling, Rash, and Other (See Comments) 11/05/2015    Family History  Problem Relation Age of Onset  . Cancer Father   . Hypertension Father   . Heart disease Father   . Asthma Father   . Cancer Mother   . Hypertension Mother   . Pancreatic cancer Mother 60  . Cancer Sister   . Breast cancer Sister 79  . Breast cancer Sister 58  . Lung cancer Brother   .  Pancreatic cancer Sister 44  . Cancer Sister     Social History   Socioeconomic History  . Marital status: Divorced    Spouse name: Not on file  . Number of children: 3  . Years of education: Not on file  . Highest education level: Some college, no degree  Occupational History  . Occupation: retired  Scientific laboratory technician  . Financial resource strain: Not hard at all  . Food insecurity    Worry: Never true    Inability: Never true  . Transportation needs    Medical: No    Non-medical: No  Tobacco Use  . Smoking status: Former Smoker    Packs/day: 0.50    Types: Cigarettes    Quit date: 10/07/2001    Years  since quitting: 17.0  . Smokeless tobacco: Never Used  Substance and Sexual Activity  . Alcohol use: No    Alcohol/week: 0.0 standard drinks  . Drug use: No  . Sexual activity: Not Currently  Lifestyle  . Physical activity    Days per week: 0 days    Minutes per session: 0 min  . Stress: Not at all  Relationships  . Social Herbalist on phone: Patient refused    Gets together: Patient refused    Attends religious service: Patient refused    Active member of club or organization: Patient refused    Attends meetings of clubs or organizations: Patient refused    Relationship status: Patient refused  . Intimate partner violence    Fear of current or ex partner: Patient refused    Emotionally abused: Patient refused    Physically abused: Patient refused    Forced sexual activity: Patient refused  Other Topics Concern  . Not on file  Social History Narrative  . Not on file    Review of Systems: See HPI, otherwise negative ROS  Physical Exam: BP (!) 157/72   Pulse 66   Temp (!) 96.8 F (36 C) (Tympanic)   Resp 18   Ht 5\' 2"  (1.575 m)   Wt 78.5 kg   SpO2 100%   BMI 31.64 kg/m  General:   Alert,  pleasant and cooperative in NAD Head:  Normocephalic and atraumatic. Neck:  Supple; no masses or thyromegaly. Lungs:  Clear throughout to auscultation,  normal respiratory effort.    Heart:  +S1, +S2, Regular rate and rhythm, No edema. Abdomen:  Soft, nontender and nondistended. Normal bowel sounds, without guarding, and without rebound.   Neurologic:  Alert and  oriented x4;  grossly normal neurologically.  Impression/Plan: Candace [name] is here for a colonoscopy to be performed for polyp surveillance and EGD for dysphagia  Risks, benefits, limitations, and alternatives regarding the procedures have been reviewed with the patient.  Questions have been answered.  All parties agreeable.   Virgel Manifold, MD  11/03/2018, 8:16 AM

## 2018-11-03 NOTE — Op Note (Signed)
[facility] Gastroenterology Patient Name: Candace [name] Procedure Date: 11/03/2018 7:59 AM MRN: [id number] Account #: [id number] Date of Birth: 11-12-1950 Admit Type: Outpatient Age: 68 Room: [facility] ENDO ROOM 1 Gender: Female Note Status: Finalized Procedure:            Upper GI endoscopy Indications:          Dysphagia Providers:            Tanieka Pownall B. Bonna Gains MD, MD Referring MD:         Janine Ores. Rosanna Randy, MD (Referring MD) Medicines:            Monitored Anesthesia Care Complications:        No immediate complications. Procedure:            Pre-Anesthesia Assessment:                       - The risks and benefits of the procedure and the                        sedation options and risks were discussed with the                        patient. All questions were answered and informed                        consent was obtained.                       - Patient identification and proposed procedure were                        verified prior to the procedure.                       - ASA Grade Assessment: II - A patient with mild                        systemic disease.                       After obtaining informed consent, the endoscope was                        passed under direct vision. Throughout the procedure,                        the patient's blood pressure, pulse, and oxygen                        saturations were monitored continuously. The Endoscope                        was introduced through the mouth, and advanced to the                        second part of duodenum. The upper GI endoscopy was                        accomplished with ease. The patient tolerated the  procedure well. Findings:      A single 5 mm mucosal nodule with a localized distribution was found at       the gastroesophageal junction. Biopsies were taken with a cold forceps       for histology. The nodule was on the gastric side of the GE junction.  There is no endoscopic evidence of stenosis or stricture in the entire       esophagus. Biopsies were obtained from the proximal and distal esophagus       with cold forceps for histology of suspected eosinophilic esophagitis.      Patchy atrophic mucosa was found in the entire examined stomach.       Biopsies were obtained in the gastric body, at the incisura and in the       gastric antrum with cold forceps for histology.      Two 4 to 5 mm sessile polyps with no bleeding and no stigmata of recent       bleeding were found in the gastric body. Biopsies were taken with a cold       forceps for histology.      The examined duodenum was normal. Impression:           - Mucosal nodule found in the esophagus. Biopsied.                       - Gastric mucosal atrophy.                       - Two gastric polyps. Biopsied.                       - Normal examined duodenum.                       - Biopsies were obtained in the gastric body, at the                        incisura and in the gastric antrum. Recommendation:       - Await pathology results.                       - Discharge patient to home.                       - Continue present medications.                       - Return to primary care physician as previously                        scheduled.                       - Resume previous diet.                       - The findings and recommendations were discussed with                        the patient.                       - The findings and recommendations were discussed with  the patient's family. Procedure Code(s):    --- Professional ---                       [phone], Esophagogastroduodenoscopy, flexible, transoral;                        with biopsy, single or multiple Diagnosis Code(s):    --- Professional ---                       K22.8, Other specified diseases of esophagus                       K31.89, Other diseases of stomach and duodenum                        K31.7, Polyp of stomach and duodenum                       R13.10, Dysphagia, unspecified CPT copyright 2019 American Medical Association. All rights reserved. The codes documented in this report are preliminary and upon coder review may  be revised to meet current compliance requirements.  [name], MD Margretta Sidle B. Bonna Gains MD, MD 11/03/2018 8:40:59 AM This report has been signed electronically. Number of Addenda: 0 Note Initiated On: 11/03/2018 7:59 AM Estimated Blood Loss: Estimated blood loss: none.      [facility]

## 2018-11-03 NOTE — Anesthesia Post-op Follow-up Note (Signed)
Anesthesia QCDR form completed.        

## 2018-11-06 ENCOUNTER — Encounter: Payer: Self-pay | Admitting: Gastroenterology

## 2018-11-06 LAB — SURGICAL PATHOLOGY

## 2018-11-21 ENCOUNTER — Telehealth: Payer: Self-pay

## 2018-11-21 NOTE — Telephone Encounter (Signed)
-----   Message from Virgel Manifold, MD sent at 11/14/2018  1:23 PM EDT ----- Jackelyn Poling please let patient know,Her biopsies did not show a reason for her difficulty swallowing.  If she is still having symptoms, I would recommend manometry.  Please refer if she is agreeable.  The polyp removed from her colon was benign but precancerous.  Repeat recommended in 1 year with 2-day prep.  Please set recall

## 2018-11-21 NOTE — Telephone Encounter (Signed)
Attempted to contact pt but voice mail box is full. (recall set for repeat colonoscopy in 1 year with 2-day prep.)

## 2018-12-05 NOTE — Telephone Encounter (Signed)
LMTCO.

## 2018-12-06 ENCOUNTER — Telehealth: Payer: Self-pay | Admitting: Gastroenterology

## 2018-12-06 NOTE — Telephone Encounter (Signed)
Candace [name] please let patient know,Her biopsies did not show a reason for her difficulty swallowing.  If she is still having symptoms, I would recommend manometry.  Please refer if she is agreeable.  The polyp removed from her colon was benign but precancerous.  Repeat recommended in 1 year with 2-day prep.  Please set recall  Patient called back to get the results of her test. Patient states she is only having trouble swallowing occasionally. Patient states she would hold off ordering the manometry test at this time. She states she will wait till her follow up appointment on 01/17/19 to see how she does. Candace [name] has already done the recall for the 1 year colonoscopy. Patient verbalized understanding of all lab results.

## 2018-12-06 NOTE — Telephone Encounter (Signed)
Pt left vm returning someone's call

## 2018-12-07 ENCOUNTER — Emergency Department
Admission: EM | Admit: 2018-12-07 | Discharge: 2018-12-08 | Disposition: A | Discharge status: Home / Self Care | Payer: Medicare Other | Attending: Emergency Medicine | Admitting: Emergency Medicine

## 2018-12-07 ENCOUNTER — Other Ambulatory Visit: Payer: Self-pay

## 2018-12-07 ENCOUNTER — Encounter: Payer: Self-pay | Admitting: Emergency Medicine

## 2018-12-07 ENCOUNTER — Emergency Department: Payer: Medicare Other

## 2018-12-07 DIAGNOSIS — R1030 Lower abdominal pain, unspecified: Secondary | ICD-10-CM

## 2018-12-07 DIAGNOSIS — Z79899 Other long term (current) drug therapy: Secondary | ICD-10-CM | POA: Diagnosis not present

## 2018-12-07 DIAGNOSIS — R079 Chest pain, unspecified: Secondary | ICD-10-CM | POA: Insufficient documentation

## 2018-12-07 DIAGNOSIS — E119 Type 2 diabetes mellitus without complications: Secondary | ICD-10-CM | POA: Insufficient documentation

## 2018-12-07 DIAGNOSIS — J449 Chronic obstructive pulmonary disease, unspecified: Secondary | ICD-10-CM | POA: Diagnosis not present

## 2018-12-07 DIAGNOSIS — Z20828 Contact with and (suspected) exposure to other viral communicable diseases: Secondary | ICD-10-CM | POA: Diagnosis not present

## 2018-12-07 DIAGNOSIS — Z8673 Personal history of transient ischemic attack (TIA), and cerebral infarction without residual deficits: Secondary | ICD-10-CM | POA: Insufficient documentation

## 2018-12-07 DIAGNOSIS — Z7901 Long term (current) use of anticoagulants: Secondary | ICD-10-CM | POA: Insufficient documentation

## 2018-12-07 DIAGNOSIS — Z87891 Personal history of nicotine dependence: Secondary | ICD-10-CM | POA: Insufficient documentation

## 2018-12-07 DIAGNOSIS — Z951 Presence of aortocoronary bypass graft: Secondary | ICD-10-CM | POA: Insufficient documentation

## 2018-12-07 DIAGNOSIS — I10 Essential (primary) hypertension: Secondary | ICD-10-CM | POA: Insufficient documentation

## 2018-12-07 DIAGNOSIS — Z7984 Long term (current) use of oral hypoglycemic drugs: Secondary | ICD-10-CM | POA: Diagnosis not present

## 2018-12-07 LAB — URINALYSIS, ROUTINE W REFLEX MICROSCOPIC
Bacteria, UA: NONE SEEN
Bilirubin Urine: NEGATIVE
Glucose, UA: NEGATIVE mg/dL
Ketones, ur: 5 mg/dL — AB
Nitrite: NEGATIVE
Protein, ur: 30 mg/dL — AB
Specific Gravity, Urine: 1.02 (ref 1.005–1.030)
pH: 5 (ref 5.0–8.0)

## 2018-12-07 LAB — CBC
HCT: 37.1 % (ref 36.0–46.0)
Hemoglobin: 12.4 g/dL (ref 12.0–15.0)
MCH: 30.8 pg (ref 26.0–34.0)
MCHC: 33.4 g/dL (ref 30.0–36.0)
MCV: 92.3 fL (ref 80.0–100.0)
Platelets: 230 10*3/uL (ref 150–400)
RBC: 4.02 MIL/uL (ref 3.87–5.11)
RDW: 12.1 % (ref 11.5–15.5)
WBC: 7.7 10*3/uL (ref 4.0–10.5)
nRBC: 0 % (ref 0.0–0.2)

## 2018-12-07 LAB — BASIC METABOLIC PANEL WITH GFR
Anion gap: 11 (ref 5–15)
BUN: 11 mg/dL (ref 8–23)
CO2: 24 mmol/L (ref 22–32)
Calcium: 9.5 mg/dL (ref 8.9–10.3)
Chloride: 103 mmol/L (ref 98–111)
Creatinine, Ser: 0.88 mg/dL (ref 0.44–1.00)
GFR calc Af Amer: >60 mL/min
GFR calc non Af Amer: >60 mL/min
Glucose, Bld: 231 mg/dL — ABNORMAL HIGH (ref 70–99)
Potassium: 3.7 mmol/L (ref 3.5–5.1)
Sodium: 138 mmol/L (ref 135–145)

## 2018-12-07 LAB — TROPONIN I (HIGH SENSITIVITY)
Troponin I (High Sensitivity): 10 ng/L
Troponin I (High Sensitivity): 10 ng/L (ref <18)

## 2018-12-07 LAB — WET PREP, GENITAL
Clue Cells Wet Prep HPF POC: NONE SEEN
Sperm: NONE SEEN
Trich, Wet Prep: NONE SEEN
Yeast Wet Prep HPF POC: NONE SEEN

## 2018-12-07 MED ORDER — FLUCONAZOLE 50 MG PO TABS
150.0000 mg | ORAL_TABLET | Freq: Once | ORAL | Status: AC
  Administered 2018-12-07: 150 mg via ORAL

## 2018-12-07 MED ORDER — ALUM & MAG HYDROXIDE-SIMETH 200-200-20 MG/5ML PO SUSP
30.0000 mL | Freq: Once | ORAL | Status: AC
  Administered 2018-12-07: 30 mL via ORAL
  Filled 2018-12-07: qty 30

## 2018-12-07 MED ORDER — IOHEXOL 350 MG/ML SOLN
100.0000 mL | Freq: Once | INTRAVENOUS | Status: AC | PRN
  Administered 2018-12-07: 100 mL via INTRAVENOUS

## 2018-12-07 MED ORDER — ACETAMINOPHEN 500 MG PO TABS
1000.0000 mg | ORAL_TABLET | Freq: Once | ORAL | Status: AC
  Administered 2018-12-07: 1000 mg via ORAL
  Filled 2018-12-07: qty 2

## 2018-12-07 NOTE — ED Notes (Signed)
Patient transported to CT 

## 2018-12-07 NOTE — ED Notes (Signed)
Pt reports chest tightness for 3 days.  Pt has sob.  Nausea today.  Pt reports high blood sugars for 3 days.  No fever.  Pt alert  Speech cleaar.
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
## 2018-12-07 NOTE — Discharge Instructions (Addendum)
Your CT scans were reassuring.  Your cardiac markers were negative.  You should follow-up with your primary care doctor as needed and let your cardiologist know about being here.  We gave you a dose of fluconazole to rx you for yeast infection.

## 2018-12-07 NOTE — ED Triage Notes (Signed)
Pt in via POV, complaints of generalized chest tightness with associated shortness of breath, N/V.  Pt also reports hyperglycemia, states her readings have been 290-450.  Ambulatory to triage, vitals WDL, NAD noted at this time.

## 2018-12-08 LAB — SARS CORONAVIRUS 2 BY RT PCR (HOSPITAL ORDER, PERFORMED IN ~~LOC~~ HOSPITAL LAB): SARS Coronavirus 2: NEGATIVE

## 2018-12-08 NOTE — ED Notes (Signed)
Pt alert.  No acute distress.

## 2018-12-08 NOTE — ED Provider Notes (Signed)
CT scan without evidence of pulmonary embolism. Abd/pel without concerning findings. Discussed this with the patient. Encouraged PCP follow up.   Nance Pear, MD 12/08/18 [phone]

## 2018-12-08 NOTE — ED Notes (Signed)
Pt return from ct scan 

## 2018-12-19 ENCOUNTER — Ambulatory Visit (INDEPENDENT_AMBULATORY_CARE_PROVIDER_SITE_OTHER): Payer: Medicare Other | Admitting: Family Medicine

## 2018-12-19 ENCOUNTER — Other Ambulatory Visit: Payer: Self-pay

## 2018-12-19 DIAGNOSIS — J301 Allergic rhinitis due to pollen: Secondary | ICD-10-CM

## 2018-12-19 MED ORDER — CETIRIZINE HCL 5 MG PO TABS: 10.0000 mg | ORAL_TABLET | Freq: Every day | ORAL | 5 refills | Status: DC
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
## 2019-01-03 ENCOUNTER — Encounter: Payer: Self-pay | Admitting: Family Medicine

## 2019-01-03 ENCOUNTER — Other Ambulatory Visit: Payer: Self-pay

## 2019-01-03 ENCOUNTER — Ambulatory Visit (INDEPENDENT_AMBULATORY_CARE_PROVIDER_SITE_OTHER): Payer: Medicare Other | Admitting: Family Medicine

## 2019-01-03 VITALS — BP 132/70 | HR 78 | Temp 97.8°F | Resp 20 | Ht 66.0 in | Wt 173.0 lb

## 2019-01-03 DIAGNOSIS — Z951 Presence of aortocoronary bypass graft: Secondary | ICD-10-CM

## 2019-01-03 DIAGNOSIS — E782 Mixed hyperlipidemia: Secondary | ICD-10-CM | POA: Diagnosis not present

## 2019-01-03 DIAGNOSIS — F418 Other specified anxiety disorders: Secondary | ICD-10-CM

## 2019-01-03 DIAGNOSIS — I10 Essential (primary) hypertension: Secondary | ICD-10-CM | POA: Diagnosis not present

## 2019-01-03 DIAGNOSIS — I6529 Occlusion and stenosis of unspecified carotid artery: Secondary | ICD-10-CM

## 2019-01-03 DIAGNOSIS — E1159 Type 2 diabetes mellitus with other circulatory complications: Secondary | ICD-10-CM | POA: Diagnosis not present

## 2019-01-03 NOTE — Progress Notes (Signed)
Patient: Candace [name] Female    DOB: [birth year]   68 y.o.   MRN: [id number] Visit Date: 01/03/2019  Today's Provider: [name], MD   Chief Complaint  Patient presents with  . Diabetes  . Hypertension  . Coronary Artery Disease   Subjective:   HPI  Diabetes Mellitus Type II, Follow-up:   Lab Results  Component Value Date   HGBA1C 9.1 (H) 03/28/2018   HGBA1C 8.2 (H) 01/12/2018   HGBA1C 8.0 (H) 08/16/2017    Last seen for diabetes 6 months ago.  Management since then includes no changes. She reports fair compliance with treatment. She is not having side effects.  Current symptoms include none and have been stable. Home blood sugar records: trend: stable  Episodes of hypoglycemia? no   Current insulin regiment: Is not on insulin Most Recent Eye Exam: due  Weight trend: increasing steadily Prior visit with dietician: No Current exercise: no regular exercise Current diet habits: well balanced  Pertinent Labs:    Component Value Date/Time   CHOL 167 01/12/2018 1014   CHOL 214 (H) 07/14/2013 0542   TRIG 145 01/12/2018 1014   TRIG 118 07/14/2013 0542   HDL 51 01/12/2018 1014   HDL 36 (L) 07/14/2013 0542   LDLCALC 87 01/12/2018 1014   LDLCALC 154 (H) 07/14/2013 0542   CREATININE 0.88 12/07/2018 1527   CREATININE 1.39 (H) 03/24/2017 1638    Wt Readings from Last 3 Encounters:  01/03/19 173 lb (78.5 kg)  12/07/18 172 lb (78 kg)  11/03/18 173 lb (78.5 kg)      Hypertension, follow-up:  BP Readings from Last 3 Encounters:  01/03/19 132/70  12/08/18 136/70  11/03/18 (!) 153/78    She was last seen for hypertension 6 months ago.  BP at that visit was 136/70. Management since that visit includes patient is now taking Metoprolol 100mg  BID per cardiology. She reports good compliance with treatment. She is not having side effects.  She is exercising. She is adherent to low salt diet.   Outside blood pressures are checked occasionally.  She is experiencing none.  Patient denies exertional chest pressure/discomfort and lower extremity edema   Emotionally pt has been much better despite covid pandemic.  Allergies  Allergen Reactions  . Lisinopril Cough  . Penicillins Swelling, Rash and Other (See Comments)    Did it involve swelling of the face/tongue/throat, SOB, or low BP? Yes Did it involve sudden or severe rash/hives, skin peeling, or any reaction on the inside of your mouth or nose? Yes Did you need to seek medical attention at a hospital or doctor's office? Yes When did it last happen?15 years If all above answers are "NO", may proceed with cephalosporin use.      Current Outpatient Medications:  .  acetaminophen (TYLENOL) 325 MG tablet, Take 650 mg by mouth every 6 (six) hours as needed for moderate pain or headache., Disp: , Rfl:  .  atorvastatin (LIPITOR) 80 MG tablet, Take 80 mg by mouth every evening. , Disp: , Rfl:  .  cetirizine (ZYRTEC) 5 MG tablet, Take 2 tablets (10 mg total) by mouth daily., Disp: 30 tablet, Rfl: 5 .  [employer] ASPIRIN ADULT LOW DOSE 81 MG chewable tablet, CHEW 1 TABLET (81 MG TOTAL) BY MOUTH DAILY. (Patient taking differently: Chew 81 mg by mouth daily. ), Disp: 90 tablet, Rfl: 3 .  famotidine (PEPCID) 40 MG tablet, Take 1 tablet (40 mg total) by mouth daily. (Patient  taking differently: Take 40 mg by mouth every evening. ), Disp: 30 tablet, Rfl: 11 .  furosemide (LASIX) 20 MG tablet, Take 20 mg by mouth every other day. , Disp: , Rfl:  .  isosorbide mononitrate (IMDUR) 30 MG 24 hr tablet, Take 2 tablets (60 mg total) by mouth daily. (Patient taking differently: Take 30 mg by mouth 2 (two) times a day. ), Disp: 60 tablet, Rfl: 12 .  metFORMIN (GLUCOPHAGE) 1000 MG tablet, Take 1 tablet (1,000 mg total) by mouth 2 (two) times daily with a meal., Disp: 180 tablet, Rfl: 3 .  metoprolol tartrate (LOPRESSOR) 50 MG tablet, TAKE 1 TABLET BY MOUTH TWICE A DAY FOR HIGH BLOOD PRESSURE (Patient  taking differently: 100 mg. ), Disp: 60 tablet, Rfl: 12 .  nitroGLYCERIN (NITROSTAT) 0.4 MG SL tablet, Place 1 tablet (0.4 mg total) under the tongue every 5 (five) minutes as needed for chest pain., Disp: 30 tablet, Rfl: 0 .  ondansetron (ZOFRAN-ODT) 8 MG disintegrating tablet, DISSOLVE 1 TABLET ON THE TONGUE EVERY 8 HOURS AS NEEDED FOR NAUSEA OR VOMITING (Patient taking differently: Take 8 mg by mouth every 8 (eight) hours as needed for nausea or vomiting. ), Disp: 30 tablet, Rfl: 1 .  pantoprazole (PROTONIX) 20 MG tablet, Take 1 tablet (20 mg total) by mouth 2 (two) times daily., Disp: 60 tablet, Rfl: 11 .  clopidogrel (PLAVIX) 75 MG tablet, Take 75 mg by mouth daily., Disp: , Rfl:  .  loperamide (IMODIUM) 2 MG capsule, Take 2-4 mg by mouth as needed for diarrhea or loose stools. , Disp: , Rfl:   Review of Systems  Constitutional: Negative for activity change and fatigue.  HENT: Negative.   Eyes: Negative.   Respiratory: Negative for cough and shortness of breath.   Cardiovascular: Negative for chest pain, palpitations and leg swelling.  Gastrointestinal: Negative.   Endocrine: Negative.   Musculoskeletal: Negative for arthralgias.  Allergic/Immunologic: Negative.   Neurological: Negative.   Psychiatric/Behavioral: Negative for agitation and self-injury. The patient is not nervous/anxious.     Social History   Tobacco Use  . Smoking status: Former Smoker    Packs/day: 0.50    Types: Cigarettes    Quit date: 10/07/2001    Years since quitting: 17.2  . Smokeless tobacco: Never Used  Substance Use Topics  . Alcohol use: No    Alcohol/week: 0.0 standard drinks      Objective:   BP 132/70   Pulse 78   Temp 97.8 F (36.6 C)   Resp 20   Ht 5\' 6"  (1.676 m)   Wt 173 lb (78.5 kg)   SpO2 98%   BMI 27.92 kg/m  Vitals:   01/03/19 0903  BP: 132/70  Pulse: 78  Resp: 20  Temp: 97.8 F (36.6 C)  SpO2: 98%  Weight: 173 lb (78.5 kg)  Height: 5\' 6"  (1.676 m)  Body mass index is  27.92 kg/m.   Physical Exam Vitals signs reviewed.  Constitutional:      Appearance: She is well-developed.  HENT:     Head: Normocephalic and atraumatic.     Right Ear: External ear normal.     Left Ear: External ear normal.     Nose: Nose normal.  Eyes:     General: No scleral icterus.    Conjunctiva/sclera: Conjunctivae normal.  Neck:     Thyroid: No thyromegaly.  Cardiovascular:     Rate and Rhythm: Normal rate and regular rhythm.     Heart sounds: Normal  heart sounds.  Pulmonary:     Effort: Pulmonary effort is normal.     Breath sounds: Normal breath sounds.  Abdominal:     Palpations: Abdomen is soft.  Musculoskeletal:     Right lower leg: No edema.     Left lower leg: No edema.  Skin:    General: Skin is warm and dry.  Neurological:     General: No focal deficit present.     Mental Status: She is alert and oriented to person, place, and time.  Psychiatric:        Mood and Affect: Mood normal.        Behavior: Behavior normal.        Thought Content: Thought content normal.        Judgment: Judgment normal.      No results found for any visits on 01/03/19.     Assessment & Plan    1. Type 2 diabetes mellitus with other circulatory complication, without long-term current use of insulin (HCC) On metformin.RTC 4 months.More than 50% 25 minute visit spent in counseling or coordination of care  - CBC with Differential/Platelet - Hemoglobin A1c  2. Benign essential HTN On Metoprolol. - Comprehensive metabolic panel  3. Mixed hyperlipidemia Atorvastatin. - Lipid panel - TSH  4. Stenosis of carotid artery, unspecified laterality  - CBC with Differential/Platelet  5. S/P CABG x 4   6. Depression with anxiety Improved.     Richard Cranford Mon, MD  [facility]

## 2019-01-04 ENCOUNTER — Telehealth: Payer: Self-pay

## 2019-01-04 DIAGNOSIS — E1159 Type 2 diabetes mellitus with other circulatory complications: Secondary | ICD-10-CM

## 2019-01-04 LAB — COMPREHENSIVE METABOLIC PANEL
ALT: 8 IU/L (ref 0–32)
AST: 12 IU/L (ref 0–40)
Albumin/Globulin Ratio: 1.7 (ref 1.2–2.2)
Albumin: 4.3 g/dL (ref 3.8–4.8)
Alkaline Phosphatase: 160 IU/L — ABNORMAL HIGH (ref 39–117)
BUN/Creatinine Ratio: 13 (ref 12–28)
BUN: 8 mg/dL (ref 8–27)
Bilirubin Total: 0.5 mg/dL (ref 0.0–1.2)
CO2: 21 mmol/L (ref 20–29)
Calcium: 9.5 mg/dL (ref 8.7–10.3)
Chloride: 103 mmol/L (ref 96–106)
Creatinine, Ser: 0.63 mg/dL (ref 0.57–1.00)
GFR calc Af Amer: 107 mL/min/{1.73_m2} (ref >59)
GFR calc non Af Amer: 93 mL/min/{1.73_m2} (ref >59)
Globulin, Total: 2.5 g/dL (ref 1.5–4.5)
Glucose: 308 mg/dL — ABNORMAL HIGH (ref 65–99)
Potassium: 4.2 mmol/L (ref 3.5–5.2)
Sodium: 140 mmol/L (ref 134–144)
Total Protein: 6.8 g/dL (ref 6.0–8.5)

## 2019-01-04 LAB — CBC WITH DIFFERENTIAL/PLATELET
Basophils Absolute: 0.1 10*3/uL (ref 0.0–0.2)
Basos: 1 %
EOS (ABSOLUTE): 0.1 10*3/uL (ref 0.0–0.4)
Eos: 3 %
Hematocrit: 38.6 % (ref 34.0–46.6)
Hemoglobin: 12.9 g/dL (ref 11.1–15.9)
Immature Grans (Abs): 0 10*3/uL (ref 0.0–0.1)
Immature Granulocytes: 0 %
Lymphocytes Absolute: 2.2 10*3/uL (ref 0.7–3.1)
Lymphs: 42 %
MCH: 31.8 pg (ref 26.6–33.0)
MCHC: 33.4 g/dL (ref 31.5–35.7)
MCV: 95 fL (ref 79–97)
Monocytes Absolute: 0.4 10*3/uL (ref 0.1–0.9)
Monocytes: 8 %
Neutrophils Absolute: 2.5 10*3/uL (ref 1.4–7.0)
Neutrophils: 46 %
Platelets: 250 10*3/uL (ref 150–450)
RBC: 4.06 x10E6/uL (ref 3.77–5.28)
RDW: 12.2 % (ref 11.7–15.4)
WBC: 5.3 10*3/uL (ref 3.4–10.8)

## 2019-01-04 LAB — LIPID PANEL
Chol/HDL Ratio: 4 ratio (ref 0.0–4.4)
Cholesterol, Total: 214 mg/dL — ABNORMAL HIGH (ref 100–199)
HDL: 53 mg/dL (ref >39)
LDL Chol Calc (NIH): 147 mg/dL — ABNORMAL HIGH (ref 0–99)
Triglycerides: 77 mg/dL (ref 0–149)
VLDL Cholesterol Cal: 14 mg/dL (ref 5–40)

## 2019-01-04 LAB — TSH: TSH: 1.18 u[IU]/mL (ref 0.450–4.500)

## 2019-01-04 LAB — HEMOGLOBIN A1C
Est. average glucose Bld gHb Est-mCnc: 280 mg/dL
Hgb A1c MFr Bld: 11.4 % — ABNORMAL HIGH (ref 4.8–5.6)

## 2019-01-04 MED ORDER — OZEMPIC (0.25 OR 0.5 MG/DOSE) 2 MG/1.5ML ~~LOC~~ SOPN: 0.2500 mg | PEN_INJECTOR | SUBCUTANEOUS | 5 refills | Status: DC

## 2019-01-04 NOTE — Telephone Encounter (Signed)
Left message to call back  

## 2019-01-04 NOTE — Telephone Encounter (Signed)
Advised daughter of results.  

## 2019-01-04 NOTE — Telephone Encounter (Signed)
-----   Message from [name]., MD sent at 01/04/2019  3:03 PM EDT ----- Diabetes/sugars worse and cholesterol too high.  If insurance will help would recommend Ozempic 0.25 mg subcu weekly.  Follow-up with daily blood sugar list in about 3 to 4 weeks on the Ozempic if possible.  If Ozempic is not covered would consider trying glimepiride 4 mg every morning

## 2019-01-05 ENCOUNTER — Telehealth: Payer: Self-pay

## 2019-01-05 NOTE — Telephone Encounter (Signed)
Pt advised.   Thanks,   -Shanetra Blumenstock  

## 2019-01-05 NOTE — Telephone Encounter (Signed)
Pt stated that Dr. Rosanna Randy added Semaglutide,0.25 or 0.5MG /DOS, (OZEMPIC, 0.25 OR 0.5 MG/DOSE,) 2 MG/1.5ML SOPN to her medication to take once a week. Pt is asking with her taking OZEMPIC once a week is she to continue taking metFORMIN (GLUCOPHAGE) 1000 MG tablet twice a day. Please advise. Thanks TNP

## 2019-01-05 NOTE — Telephone Encounter (Signed)
Yes

## 2019-01-09 ENCOUNTER — Other Ambulatory Visit: Payer: Self-pay | Admitting: Family Medicine

## 2019-01-10 ENCOUNTER — Telehealth: Payer: Self-pay | Admitting: Family Medicine

## 2019-01-10 NOTE — Telephone Encounter (Signed)
Pt called saying she started taking the insulin on Sunday and has been vomiting a couple times a day since then  Please advise [phone]  [facility]

## 2019-01-10 NOTE — Telephone Encounter (Signed)
Patient advised to push fluids per Dr. Rosanna Randy. She states that she still has not been back to urinate, but also has not been drinking any fluids. She states that she is feeling full. Advised to start a bland diet to decrease stomach upset and irritation. Patient has tried to drink ginger ale, she was advised that she should not drink regular sodas because it may increase sugars. Advised to call back tomorrow if her symptoms are better or not. If they become worse overnight she was advised to got the ER for further evaluation.

## 2019-01-11 ENCOUNTER — Ambulatory Visit (INDEPENDENT_AMBULATORY_CARE_PROVIDER_SITE_OTHER): Payer: Medicare Other | Admitting: Family Medicine

## 2019-01-11 DIAGNOSIS — K829 Disease of gallbladder, unspecified: Secondary | ICD-10-CM | POA: Diagnosis not present

## 2019-01-11 DIAGNOSIS — R1011 Right upper quadrant pain: Secondary | ICD-10-CM

## 2019-01-11 DIAGNOSIS — R112 Nausea with vomiting, unspecified: Secondary | ICD-10-CM | POA: Diagnosis not present

## 2019-01-11 NOTE — Progress Notes (Signed)
Patient: Candace [name] Female    DOB: 08-Jun-1950   68 y.o.   MRN: [id number] Visit Date: 01/11/2019  Today's Provider: [name], MD   Chief Complaint  Patient presents with  . Nausea  . Emesis   Subjective:     HPI  Patient states that she has been experiencing vomiting, and nausea since Sunday after starting her insulin. She has complaints of fullness in her abdomen without eating any meals. Patient is only taking sips of water and eating ice chips. Feels very fatigued. She has only urinated once today and once yesterday.  Virtual Visit via Telephone Note  I connected with [name] on 01/11/19 at  4:10 PM EDT by telephone and verified that I am speaking with the correct person using two identifiers.  Location: Patient: Home Provider: Office   I discussed the limitations, risks, security and privacy concerns of performing an evaluation and management service by telephone and the availability of in person appointments. I also discussed with the patient that there may be a patient responsible charge related to this service. The patient expressed understanding and agreed to proceed.   History of Present Illness: Patient has had an about a week of intermittent diarrhea and some belching which seems to be aggravated by food, especially fatty food.  See says she has abdominal pain all over but when I had her press on her abdomen it seemed to be a little bit worse in the right upper quadrant.  No fevers chills cough or any other Covid symptoms.  She has had no Covid exposure.   Observations/Objective:   Assessment and Plan: 1. Gallbladder attack It is possible this could be gallbladder disease.  Could be anxiety also.  She has a history of this in recent years.  Obtain testing as below.  2. Nausea and vomiting, intractability of vomiting not specified, unspecified vomiting type  - US Abdomen Limited RUQ; Future - CBC w/Diff/Platelet - Comp. Metabolic  Panel (12) - Lipase  3. Right upper quadrant abdominal pain  - CBC w/Diff/Platelet - Comp. Metabolic Panel (12) - Lipase   Follow Up Instructions:    I discussed the assessment and treatment plan with the patient. The patient was provided an opportunity to ask questions and all were answered. The patient agreed with the plan and demonstrated an understanding of the instructions.   The patient was advised to call back or seek an in-person evaluation if the symptoms worsen or if the condition fails to improve as anticipated.  I provided 12 minutes of non-face-to-face time during this encounter.   Janeece Riggers, LPN  Allergies  Allergen Reactions  . Lisinopril Cough  . Penicillins Swelling, Rash and Other (See Comments)    Did it involve swelling of the face/tongue/throat, SOB, or low BP? Yes Did it involve sudden or severe rash/hives, skin peeling, or any reaction on the inside of your mouth or nose? Yes Did you need to seek medical attention at a hospital or doctor's office? Yes When did it last happen?15 years If all above answers are "NO", may proceed with cephalosporin use.      Current Outpatient Medications:  .  acetaminophen (TYLENOL) 325 MG tablet, Take 650 mg by mouth every 6 (six) hours as needed for moderate pain or headache., Disp: , Rfl:  .  atorvastatin (LIPITOR) 80 MG tablet, Take 80 mg by mouth every evening. , Disp: , Rfl:  .  cetirizine (ZYRTEC) 5 MG  tablet, Take 2 tablets (10 mg total) by mouth daily., Disp: 30 tablet, Rfl: 5 .  clopidogrel (PLAVIX) 75 MG tablet, Take 75 mg by mouth daily., Disp: , Rfl:  .  [employer] ASPIRIN ADULT LOW DOSE 81 MG chewable tablet, CHEW 1 TABLET (81 MG TOTAL) BY MOUTH DAILY. (Patient taking differently: Chew 81 mg by mouth daily. ), Disp: 90 tablet, Rfl: 3 .  famotidine (PEPCID) 40 MG tablet, Take 1 tablet (40 mg total) by mouth daily. (Patient taking differently: Take 40 mg by mouth every evening. ), Disp: 30 tablet, Rfl: 11  .  furosemide (LASIX) 20 MG tablet, Take 20 mg by mouth every other day. , Disp: , Rfl:  .  isosorbide mononitrate (IMDUR) 30 MG 24 hr tablet, Take 2 tablets (60 mg total) by mouth daily. (Patient taking differently: Take 30 mg by mouth 2 (two) times a day. ), Disp: 60 tablet, Rfl: 12 .  loperamide (IMODIUM) 2 MG capsule, Take 2-4 mg by mouth as needed for diarrhea or loose stools. , Disp: , Rfl:  .  metFORMIN (GLUCOPHAGE) 1000 MG tablet, Take 1 tablet (1,000 mg total) by mouth 2 (two) times daily with a meal., Disp: 180 tablet, Rfl: 3 .  metoprolol tartrate (LOPRESSOR) 50 MG tablet, TAKE 1 TABLET BY MOUTH TWICE A DAY FOR HIGH BLOOD PRESSURE (Patient taking differently: 100 mg. ), Disp: 60 tablet, Rfl: 12 .  nitroGLYCERIN (NITROSTAT) 0.4 MG SL tablet, Place 1 tablet (0.4 mg total) under the tongue every 5 (five) minutes as needed for chest pain., Disp: 30 tablet, Rfl: 0 .  ondansetron (ZOFRAN-ODT) 8 MG disintegrating tablet, DISSOLVE 1 TABLET ON THE TONGUE EVERY 8 HOURS AS NEEDED FOR NAUSEA OR VOMITING, Disp: 30 tablet, Rfl: 1 .  pantoprazole (PROTONIX) 20 MG tablet, Take 1 tablet (20 mg total) by mouth 2 (two) times daily., Disp: 60 tablet, Rfl: 11 .  Semaglutide,0.25 or 0.5MG /DOS, (OZEMPIC, 0.25 OR 0.5 MG/DOSE,) 2 MG/1.5ML SOPN, Inject 0.25 mg into the skin once a week., Disp: 4 pen, Rfl: 5  Review of Systems  Social History   Tobacco Use  . Smoking status: Former Smoker    Packs/day: 0.50    Types: Cigarettes    Quit date: 10/07/2001    Years since quitting: 17.2  . Smokeless tobacco: Never Used  Substance Use Topics  . Alcohol use: No    Alcohol/week: 0.0 standard drinks      Objective:   There were no vitals taken for this visit. There were no vitals filed for this visit.There is no height or weight on file to calculate BMI.   Physical Exam   No results found for any visits on 01/11/19.     Assessment & Plan        [name], MD  [facility]

## 2019-01-13 LAB — COMP. METABOLIC PANEL (12)
AST: 19 IU/L (ref 0–40)
Albumin/Globulin Ratio: 2 (ref 1.2–2.2)
Albumin: 4.5 g/dL (ref 3.8–4.8)
Alkaline Phosphatase: 134 IU/L — ABNORMAL HIGH (ref 39–117)
BUN/Creatinine Ratio: 13 (ref 12–28)
BUN: 10 mg/dL (ref 8–27)
Bilirubin Total: 0.6 mg/dL (ref 0.0–1.2)
Calcium: 9.4 mg/dL (ref 8.7–10.3)
Chloride: 104 mmol/L (ref 96–106)
Creatinine, Ser: 0.8 mg/dL (ref 0.57–1.00)
GFR calc Af Amer: 88 mL/min/{1.73_m2} (ref >59)
GFR calc non Af Amer: 77 mL/min/{1.73_m2} (ref >59)
Globulin, Total: 2.2 g/dL (ref 1.5–4.5)
Glucose: 122 mg/dL — ABNORMAL HIGH (ref 65–99)
Potassium: 4.2 mmol/L (ref 3.5–5.2)
Sodium: 143 mmol/L (ref 134–144)
Total Protein: 6.7 g/dL (ref 6.0–8.5)

## 2019-01-13 LAB — LIPASE: Lipase: 26 U/L (ref 14–72)

## 2019-01-13 LAB — CBC WITH DIFFERENTIAL/PLATELET
Basophils Absolute: 0 10*3/uL (ref 0.0–0.2)
Basos: 1 %
EOS (ABSOLUTE): 0.1 10*3/uL (ref 0.0–0.4)
Eos: 2 %
Hematocrit: 40.5 % (ref 34.0–46.6)
Hemoglobin: 13.5 g/dL (ref 11.1–15.9)
Immature Grans (Abs): 0 10*3/uL (ref 0.0–0.1)
Immature Granulocytes: 0 %
Lymphocytes Absolute: 2.8 10*3/uL (ref 0.7–3.1)
Lymphs: 42 %
MCH: 31.9 pg (ref 26.6–33.0)
MCHC: 33.3 g/dL (ref 31.5–35.7)
MCV: 96 fL (ref 79–97)
Monocytes Absolute: 0.5 10*3/uL (ref 0.1–0.9)
Monocytes: 7 %
Neutrophils Absolute: 3.3 10*3/uL (ref 1.4–7.0)
Neutrophils: 48 %
Platelets: 222 10*3/uL (ref 150–450)
RBC: 4.23 x10E6/uL (ref 3.77–5.28)
RDW: 12.7 % (ref 11.7–15.4)
WBC: 6.8 10*3/uL (ref 3.4–10.8)

## 2019-01-16 ENCOUNTER — Telehealth: Payer: Self-pay

## 2019-01-16 ENCOUNTER — Other Ambulatory Visit: Payer: Self-pay

## 2019-01-16 NOTE — Telephone Encounter (Signed)
Advised 

## 2019-01-16 NOTE — Telephone Encounter (Signed)
Pt returned missed call. Please call pt back with results.  Thanks, [employer]

## 2019-01-16 NOTE — Telephone Encounter (Signed)
-----   Message from [name]., MD sent at 01/16/2019  8:10 AM EDT ----- Labs in normal range.

## 2019-01-16 NOTE — Telephone Encounter (Signed)
LMTCB

## 2019-01-17 ENCOUNTER — Ambulatory Visit (INDEPENDENT_AMBULATORY_CARE_PROVIDER_SITE_OTHER): Payer: Medicare Other | Admitting: Gastroenterology

## 2019-01-17 ENCOUNTER — Other Ambulatory Visit: Payer: Self-pay

## 2019-01-17 ENCOUNTER — Encounter: Payer: Self-pay | Admitting: Gastroenterology

## 2019-01-17 VITALS — BP 110/75 | HR 88 | Temp 98.6°F | Wt 168.4 lb

## 2019-01-17 DIAGNOSIS — R112 Nausea with vomiting, unspecified: Secondary | ICD-10-CM | POA: Diagnosis not present

## 2019-01-17 NOTE — Progress Notes (Signed)
Candace Antigua, MD [address]  [place]  [place], [place] [address]  Main: [phone]  Fax: [phone]   Primary Care Physician: [name]., MD   Chief Complaint  Patient presents with  . Diarrhea    Has every other day 3 or more times a day     HPI: Candace [name] is a 68 y.o. female previously seen for dysphagia which has completely resolved.  Now reporting nausea and vomiting for the last 1 week.  Also associated with abdominal discomfort diffusely.  Eating sometimes makes his symptoms worse but not every time.  Also reporting 2-3 loose bowel movements a day, without blood.  No weight loss.  Patient states she has an abdominal ultrasound pending and that was ordered by her primary care provider to evaluate her gallbladder due to her ongoing symptoms.  She underwent EGD and colonoscopy in August 2020 due to her symptoms  Impression:           - Mucosal nodule found in the esophagus. Biopsied.                       - Gastric mucosal atrophy.                       - Two gastric polyps. Biopsied.                       - Normal examined duodenum.                       - Biopsies were obtained in the gastric body, at the                        incisura and in the gastric antrum. Impression:           - Two 3 to 4 mm polyps in the transverse colon, removed                        with a jumbo cold forceps. Resected and retrieved.                       - Diverticulosis in the sigmoid colon.                       - The examination was otherwise normal.                       - The rectum, sigmoid colon, descending colon,                        transverse colon, ascending colon and cecum are normal.                       - Anal papilla(e) were hypertrophied.                       - The distal rectum and anal verge are normal on                        retroflexion view.                       - Prep was  fair to poor as described above. Large                         vegetable matter in the sigmoid colon that could not be                        suctioned. Repeat colonoscopy recommended in 1 year with 2-day prep  DIAGNOSIS:  A. STOMACH; COLD BIOPSY:  - REACTIVE GASTROPATHY.  - NEGATIVE FOR ACTIVE INFLAMMATION AND H. PYLORI.  - NEGATIVE FOR INTESTINAL METAPLASIA, DYSPLASIA, AND MALIGNANCY.   B. STOMACH POLYPS X2; COLD BIOPSY:  - FUNDIC GLAND POLYP, 2 FRAGMENTS.  - NEGATIVE FOR DYSPLASIA AND MALIGNANCY.   C. ESOPHAGUS; COLD BIOPSY:  - UNREMARKABLE SQUAMOUS MUCOSA.  - NEGATIVE FOR INCREASED EOSINOPHILS (<1 PER HIGH-POWER FIELD).  - NEGATIVE FOR INTESTINAL METAPLASIA, DYSPLASIA, AND MALIGNANCY.   D. GEJ NODULE, GASTRIC SIDE; COLDBIOPSY:  - SQUAMOCOLUMNAR JUNCTION WITH INTRAMUCOSAL LYMPHOID AGGREGATE.  - NEGATIVE FOR INTESTINAL METAPLASIA, DYSPLASIA, AND MALIGNANCY.   E. COLON POLYPS X2, TRANSVERSE; COLD BIOPSY:  - TUBULAR ADENOMA, 2 FRAGMENTS.  - NEGATIVE FOR HIGH-GRADE DYSPLASIA AND MALIGNANCY  Patient has had previous endoscopies with [facility]  Last colonoscopy was in 2015 and 2 tubular adenoma colon polyps removed.  Colon biopsies did not show microscopic colitis.  2011 upper endoscopy for dysphagia with mild Schatzki's ring noted and dilated to 16 mm with savory dilator.  Duodenitis also reported  Subsequent upper endoscopies in 2013 and 2015 did not report any esophageal lesions.  Gastric erythema was reported and biopsies were negative for H. Pylori.  Current Outpatient Medications  Medication Sig Dispense Refill  . acetaminophen (TYLENOL) 325 MG tablet Take 650 mg by mouth every 6 (six) hours as needed for moderate pain or headache.    Marland Kitchen atorvastatin (LIPITOR) 80 MG tablet Take 80 mg by mouth every evening.     . cetirizine (ZYRTEC) 5 MG tablet Take 2 tablets (10 mg total) by mouth daily. 30 tablet 5  . [facility] ASPIRIN ADULT LOW DOSE 81 MG chewable tablet CHEW 1 TABLET (81 MG TOTAL) BY MOUTH DAILY. (Patient taking  differently: Chew 81 mg by mouth daily. ) 90 tablet 3  . famotidine (PEPCID) 40 MG tablet     . furosemide (LASIX) 20 MG tablet Take 20 mg by mouth every other day.     . isosorbide mononitrate (IMDUR) 30 MG 24 hr tablet Take 2 tablets (60 mg total) by mouth daily. (Patient taking differently: Take 30 mg by mouth 2 (two) times a day. ) 60 tablet 12  . loperamide (IMODIUM) 2 MG capsule Take 2-4 mg by mouth as needed for diarrhea or loose stools.     . metFORMIN (GLUCOPHAGE) 1000 MG tablet Take 1 tablet (1,000 mg total) by mouth 2 (two) times daily with a meal. 180 tablet 3  . metoprolol tartrate (LOPRESSOR) 100 MG tablet Take 100 mg by mouth 2 (two) times daily.    . nitroGLYCERIN (NITROSTAT) 0.4 MG SL tablet Place 1 tablet (0.4 mg total) under the tongue every 5 (five) minutes as needed for chest pain. 30 tablet 0  . ondansetron (ZOFRAN-ODT) 8 MG disintegrating tablet DISSOLVE 1 TABLET ON THE TONGUE EVERY 8 HOURS AS NEEDED FOR NAUSEA OR VOMITING 30 tablet 1  . pantoprazole (PROTONIX) 20 MG tablet Take 1 tablet (20 mg total) by mouth 2 (two) times daily. 60 tablet 11  . Semaglutide,0.25 or 0.5MG/DOS, ([place],  0.25 OR 0.5 MG/DOSE,) 2 MG/1.5ML SOPN Inject 0.25 mg into the skin once a week. 4 pen 5   No current facility-administered medications for this visit.     Allergies as of 01/17/2019 - Review Complete 01/17/2019  Allergen Reaction Noted  . Lisinopril Cough 02/07/2017  . Penicillins Swelling, Rash, and Other (See Comments) 11/05/2015    ROS:  General: Negative for anorexia, weight loss, fever, chills, fatigue, weakness. ENT: Negative for hoarseness, difficulty swallowing , nasal congestion. CV: Negative for chest pain, angina, palpitations, dyspnea on exertion, peripheral edema.  Respiratory: Negative for dyspnea at rest, dyspnea on exertion, cough, sputum, wheezing.  GI: See history of present illness. GU:  Negative for dysuria, hematuria, urinary incontinence, urinary frequency,  nocturnal urination.  Endo: Negative for unusual weight change.    Physical Examination:   BP 110/75 (BP Location: Left Arm, Patient Position: Sitting, Cuff Size: Normal)   Pulse 88   Temp 98.6 F (37 C) (Oral)   Wt 168 lb 6 oz (76.4 kg)   BMI 27.18 kg/m   General: Well-nourished, well-developed in no acute distress.  Eyes: No icterus. Conjunctivae pink. Mouth: Oropharyngeal mucosa moist and pink , no lesions erythema or exudate. Neck: Supple, Trachea midline Abdomen: Bowel sounds are normal, nontender, nondistended, no hepatosplenomegaly or masses, no abdominal bruits or hernia , no rebound or guarding.   Extremities: No lower extremity edema. No clubbing or deformities. Neuro: Alert and oriented x 3.  Grossly intact. Skin: Warm and dry, no jaundice.   Psych: Alert and cooperative, normal mood and affect.   Labs: CMP     Component Value Date/Time   NA 143 01/12/2019 0955   NA 137 04/29/2014 2042   K 4.2 01/12/2019 0955   K 3.5 04/29/2014 2042   CL 104 01/12/2019 0955   CL 103 04/29/2014 2042   CO2 21 01/03/2019 0957   CO2 24 04/29/2014 2042   GLUCOSE 122 (H) 01/12/2019 0955   GLUCOSE 231 (H) 12/07/2018 1527   GLUCOSE 249 (H) 04/29/2014 2042   BUN 10 01/12/2019 0955   BUN 10 04/29/2014 2042   CREATININE 0.80 01/12/2019 0955   CREATININE 1.39 (H) 03/24/2017 1638   CALCIUM 9.4 01/12/2019 0955   CALCIUM 9.8 04/29/2014 2042   PROT 6.7 01/12/2019 0955   PROT 7.6 04/29/2014 2042   ALBUMIN 4.5 01/12/2019 0955   ALBUMIN 4.0 04/29/2014 2042   AST 19 01/12/2019 0955   AST 18 04/29/2014 2042   ALT 8 01/03/2019 0957   ALT 22 04/29/2014 2042   ALKPHOS 134 (H) 01/12/2019 0955   ALKPHOS 112 04/29/2014 2042   BILITOT 0.6 01/12/2019 0955   BILITOT 0.2 04/29/2014 2042   GFRNONAA 77 01/12/2019 0955   GFRNONAA >60 04/29/2014 2042   GFRNONAA >60 09/16/2013 0523   GFRAA 88 01/12/2019 0955   GFRAA >60 04/29/2014 2042   GFRAA >60 09/16/2013 0523   Lab Results  Component Value  Date   WBC 6.8 01/12/2019   HGB 13.5 01/12/2019   HCT 40.5 01/12/2019   MCV 96 01/12/2019   PLT 222 01/12/2019    Imaging Studies: No results found.  Assessment and Plan:   LIZVET CHUNN is a 68 y.o. y/o female with nausea vomiting and loose stools  Agree with right upper quadrant ultrasound ordered already by primary care provider.  This is pending.  If this shows gallbladder abnormalities, this would explain patient's symptoms and patient should be referred to surgery  Dysphagia has completely resolved  Is taking  Imodium as needed for diarrhea and this is helping  Once her nausea vomiting resolves, we may consider starting Metamucil, but will hold off for now until ultrasound and work-up is complete  Labs reviewed and are otherwise reassuring.  Mildly elevated alk phos noted and may be due to nausea and vomiting.  We will repeat on next visit  Dr Candace [name]

## 2019-01-18 ENCOUNTER — Ambulatory Visit
Admission: RE | Admit: 2019-01-18 | Discharge: 2019-01-18 | Disposition: A | Discharge status: Home / Self Care | Payer: Medicare Other | Source: Ambulatory Visit | Attending: Family Medicine | Admitting: Family Medicine

## 2019-01-18 ENCOUNTER — Telehealth: Payer: Self-pay | Admitting: *Deleted

## 2019-01-18 DIAGNOSIS — R112 Nausea with vomiting, unspecified: Secondary | ICD-10-CM | POA: Insufficient documentation

## 2019-01-18 NOTE — Telephone Encounter (Signed)
Pt returned call ° °teri °

## 2019-01-18 NOTE — Telephone Encounter (Signed)
LMOVM for pt to return call 

## 2019-01-18 NOTE — Telephone Encounter (Signed)
-----   Message from [name]., MD sent at 01/18/2019 12:59 PM EDT ----- Fatty liver only.

## 2019-01-18 NOTE — Telephone Encounter (Signed)
Patient was notified of results. Patient stated she is still having nausea. Patient stated symptoms have improved slightly since her ov and Zofran is helping. Patient wanted to no if there is anything else she should do? Please advise? Patient is aware Dr. Rosanna Randy is out till Monday.

## 2019-02-04 ENCOUNTER — Other Ambulatory Visit: Payer: Self-pay | Admitting: Family Medicine

## 2019-02-04 DIAGNOSIS — E1159 Type 2 diabetes mellitus with other circulatory complications: Secondary | ICD-10-CM

## 2019-03-05 NOTE — Progress Notes (Addendum)
Patient: Candace [name] Female    DOB: 10/07/1950   68 y.o.   MRN: [id number] Visit Date: 03/05/2019  Today's Provider: Marcille Buffy, FNP   Chief Complaint  Patient presents with  . URI   Subjective:    Virtual Visit via Telephone Note  I connected with [name] on 03/05/19 at  8:00 AM EST by telephone and verified that I am speaking with the correct person using two identifiers.  Location: Patient: at home  Provider: Provider: Provider's office at  [facility], [place].     I discussed the limitations, risks, security and privacy concerns of performing an evaluation and management service by telephone and the availability of in person appointments. I also discussed with the patient that there may be a patient responsible charge related to this service. The patient expressed understanding and agreed to proceed.    I discussed the assessment and treatment plan with the patient. The patient was provided an opportunity to ask questions and all were answered. The patient agreed with the plan and demonstrated an understanding of the instructions.   The patient was advised to call back or seek an in-person evaluation if the symptoms worsen or if the condition fails to improve as anticipated.  I provided 15 minutes of non-face-to-face time during this encounter.   URI  This is a new problem. There has been no fever. Associated symptoms include chest pain, congestion, coughing, rhinorrhea and a sore throat. Pertinent negatives include no ear pain, headaches, neck pain, sinus pain, sneezing or wheezing.   Cough x 1 week. Sore throat. Coughing a lot durning the day and night. Has body aches.She feels as if she has more shortness of breath, and its harder to walk across the room.  She has had two Covid test, she had a negative test at work this week. She has no reported edema.  She was seen by Dr. Greggory Stallion was then Covid tested and it was  negative last Friday , 03/02/2019. She reports her EKG showed atrial fibrillation and she is waiting to hear back from her cardiologist since she had her Covid test for instructions.  She reports she has increased fatigue. She has increased chest pain with deep breathing, she feels as if " I pulled a muscle with all the coughing". She has a history of CHF. She has no reported edema.  She denies any chest pain without breathing, and denies any radiating pain.  Cough is non productive.  Body aches  Paroxysmal A-fib (CMS-HCC) (Primary Dx);  Dry cough;  Rhinorrhea;  Shortness of breath;  Chest pain on exertion, unspecified;  SOB (shortness of breath);  Coronary artery disease involving native coronary artery of native heart without angina pectoris;  Benign essential hypertension;  Non-ST elevation myocardial infarction (NSTEMI), subendocardial infarction, subsequent episode of care (CMS-HCC);  Left bundle branch block;  Tachycardia  Denies any known covid exposure.   Patient  denies any fever,chills, rash,  nausea, vomiting, or diarrhea.    Allergies  Allergen Reactions  . Lisinopril Cough  . Penicillins Swelling, Rash and Other (See Comments)    Did it involve swelling of the face/tongue/throat, SOB, or low BP? Yes Did it involve sudden or severe rash/hives, skin peeling, or any reaction on the inside of your mouth or nose? Yes Did you need to seek medical attention at a hospital or doctor's office? Yes When did it last happen?15 years If all above answers are "NO",  may proceed with cephalosporin use.      Current Outpatient Medications:  .  acetaminophen (TYLENOL) 325 MG tablet, Take 650 mg by mouth every 6 (six) hours as needed for moderate pain or headache., Disp: , Rfl:  .  atorvastatin (LIPITOR) 80 MG tablet, Take 80 mg by mouth every evening. , Disp: , Rfl:  .  cetirizine (ZYRTEC) 5 MG tablet, Take 2 tablets (10 mg total) by mouth daily., Disp: 30 tablet, Rfl: 5 .  [facility]  ASPIRIN ADULT LOW DOSE 81 MG chewable tablet, CHEW 1 TABLET (81 MG TOTAL) BY MOUTH DAILY. (Patient taking differently: Chew 81 mg by mouth daily. ), Disp: 90 tablet, Rfl: 3 .  famotidine (PEPCID) 40 MG tablet, , Disp: , Rfl:  .  furosemide (LASIX) 20 MG tablet, Take 20 mg by mouth every other day. , Disp: , Rfl:  .  isosorbide mononitrate (IMDUR) 30 MG 24 hr tablet, Take 2 tablets (60 mg total) by mouth daily. (Patient taking differently: Take 30 mg by mouth 2 (two) times a day. ), Disp: 60 tablet, Rfl: 12 .  loperamide (IMODIUM) 2 MG capsule, Take 2-4 mg by mouth as needed for diarrhea or loose stools. , Disp: , Rfl:  .  metFORMIN (GLUCOPHAGE) 1000 MG tablet, Take 1 tablet (1,000 mg total) by mouth 2 (two) times daily with a meal., Disp: 180 tablet, Rfl: 3 .  metoprolol tartrate (LOPRESSOR) 100 MG tablet, Take 100 mg by mouth 2 (two) times daily., Disp: , Rfl:  .  nitroGLYCERIN (NITROSTAT) 0.4 MG SL tablet, Place 1 tablet (0.4 mg total) under the tongue every 5 (five) minutes as needed for chest pain., Disp: 30 tablet, Rfl: 0 .  ondansetron (ZOFRAN-ODT) 8 MG disintegrating tablet, DISSOLVE 1 TABLET ON THE TONGUE EVERY 8 HOURS AS NEEDED FOR NAUSEA OR VOMITING, Disp: 30 tablet, Rfl: 1 .  pantoprazole (PROTONIX) 20 MG tablet, Take 1 tablet (20 mg total) by mouth 2 (two) times daily., Disp: 60 tablet, Rfl: 11 .  Semaglutide,0.25 or 0.5MG /DOS, ([place], 0.25 OR 0.5 MG/DOSE,) 2 MG/1.5ML SOPN, Inject 0.25 mg into the skin once a week., Disp: 4 pen, Rfl: 5  Review of Systems  Constitutional: Positive for appetite change and fatigue. Negative for activity change, chills, diaphoresis, fever and unexpected weight change.  HENT: Positive for congestion, postnasal drip, rhinorrhea and sore throat. Negative for dental problem, drooling, ear discharge, ear pain, facial swelling, hearing loss, mouth sores, nosebleeds, sinus pressure, sinus pain, sneezing, tinnitus and trouble swallowing.   Respiratory: Positive for  cough, chest tightness and shortness of breath. Negative for apnea, choking, wheezing and stridor.   Cardiovascular: Positive for chest pain. Negative for palpitations and leg swelling.  Gastrointestinal: Negative.   Genitourinary: Negative.   Musculoskeletal: Positive for arthralgias and myalgias. Negative for back pain, gait problem, joint swelling, neck pain and neck stiffness.  Skin: Negative.   Neurological: Negative for dizziness, syncope, speech difficulty, weakness, light-headedness, numbness and headaches.  Psychiatric/Behavioral: Negative.     Social History   Tobacco Use  . Smoking status: Former Smoker    Packs/day: 0.50    Types: Cigarettes    Quit date: 10/07/2001    Years since quitting: 17.4  . Smokeless tobacco: Never Used  Substance Use Topics  . Alcohol use: No    Alcohol/week: 0.0 standard drinks      Objective:   There were no vitals taken for this visit. There were no vitals filed for this visit.There is no height or weight on  file to calculate BMI.   Physical Exam   Patient is alert and oriented and responsive to questions Engages in conversation with provider. Speaks with intermittent pauses and mild dyspnea on phone with provider while at rest.   She has no way to report vital signs.    No results found for any visits on 03/06/19.     Assessment & Plan       Viral upper respiratory tract infection  S/P CABG x 4  Atypical chest pain Given history above reviewed with patient,suspect possible flu, however given extensive history  advised she should have an in person evaluation, given recent diagnosis of A- fib, and extensive cardiac history/ CHF  . She reports she has had a bad experience with the emergency room and will not go back unless she gets severe. Provider advised emergency room given severity of her symptoms- she is adamant she is not going. She is in agreement to go to the [facility] urgent care for a hands on evaluation now - since with  Covid Pandemic we are unable to see her in the office. Advised 911 if needed.   She understands she needs to be evaluated in person now and will go to [facility]. She will call back after visit if she has any questions or concerns at that time.   Follow up with cardiology by phone for further advice related to her diagnosis and last visit after visit today.  Patient verbalized understanding of all instructions given and denies any further questions at this time.    The entirety of the information documented in the History of Present Illness, Review of Systems and Physical Exam were personally obtained by me. Portions of this information were initially documented by the  Certified Medical Assistant whose name is documented in [place] and reviewed by me for thoroughness and accuracy.  I have personally performed the exam and reviewed the chart and it is accurate to the best of my knowledge.  Haematologist has been used and any errors in dictation or transcription are unintentional.  Kelby Aline. [place], [facility]

## 2019-03-06 ENCOUNTER — Ambulatory Visit (INDEPENDENT_AMBULATORY_CARE_PROVIDER_SITE_OTHER): Payer: Medicare Other

## 2019-03-06 ENCOUNTER — Ambulatory Visit (INDEPENDENT_AMBULATORY_CARE_PROVIDER_SITE_OTHER): Payer: Medicare Other | Admitting: Adult Health

## 2019-03-06 ENCOUNTER — Ambulatory Visit (INDEPENDENT_AMBULATORY_CARE_PROVIDER_SITE_OTHER)
Admission: EM | Admit: 2019-03-06 | Discharge: 2019-03-06 | Disposition: A | Discharge status: Home / Self Care | Payer: Medicare Other | Source: Home / Self Care

## 2019-03-06 ENCOUNTER — Encounter: Payer: Self-pay | Admitting: Adult Health

## 2019-03-06 ENCOUNTER — Emergency Department: Payer: Medicare Other

## 2019-03-06 ENCOUNTER — Encounter: Payer: Self-pay | Admitting: Emergency Medicine

## 2019-03-06 ENCOUNTER — Inpatient Hospital Stay
Admission: EM | Admit: 2019-03-06 | Discharge: 2019-03-09 | DRG: 202 | Disposition: A | Discharge status: Home Health | Payer: Medicare Other | Attending: Internal Medicine | Admitting: Internal Medicine

## 2019-03-06 ENCOUNTER — Other Ambulatory Visit: Payer: Self-pay

## 2019-03-06 DIAGNOSIS — R05 Cough: Secondary | ICD-10-CM

## 2019-03-06 DIAGNOSIS — R0789 Other chest pain: Secondary | ICD-10-CM | POA: Diagnosis not present

## 2019-03-06 DIAGNOSIS — Z801 Family history of malignant neoplasm of trachea, bronchus and lung: Secondary | ICD-10-CM

## 2019-03-06 DIAGNOSIS — R0602 Shortness of breath: Secondary | ICD-10-CM | POA: Insufficient documentation

## 2019-03-06 DIAGNOSIS — R2681 Unsteadiness on feet: Secondary | ICD-10-CM

## 2019-03-06 DIAGNOSIS — I447 Left bundle-branch block, unspecified: Secondary | ICD-10-CM

## 2019-03-06 DIAGNOSIS — R42 Dizziness and giddiness: Secondary | ICD-10-CM | POA: Insufficient documentation

## 2019-03-06 DIAGNOSIS — Z888 Allergy status to other drugs, medicaments and biological substances status: Secondary | ICD-10-CM

## 2019-03-06 DIAGNOSIS — R5383 Other fatigue: Secondary | ICD-10-CM | POA: Insufficient documentation

## 2019-03-06 DIAGNOSIS — E118 Type 2 diabetes mellitus with unspecified complications: Secondary | ICD-10-CM

## 2019-03-06 DIAGNOSIS — E1151 Type 2 diabetes mellitus with diabetic peripheral angiopathy without gangrene: Secondary | ICD-10-CM | POA: Diagnosis present

## 2019-03-06 DIAGNOSIS — E119 Type 2 diabetes mellitus without complications: Secondary | ICD-10-CM

## 2019-03-06 DIAGNOSIS — R079 Chest pain, unspecified: Secondary | ICD-10-CM | POA: Diagnosis present

## 2019-03-06 DIAGNOSIS — Z8673 Personal history of transient ischemic attack (TIA), and cerebral infarction without residual deficits: Secondary | ICD-10-CM

## 2019-03-06 DIAGNOSIS — E782 Mixed hyperlipidemia: Secondary | ICD-10-CM | POA: Diagnosis present

## 2019-03-06 DIAGNOSIS — I2510 Atherosclerotic heart disease of native coronary artery without angina pectoris: Secondary | ICD-10-CM | POA: Diagnosis not present

## 2019-03-06 DIAGNOSIS — Z800 Family history of malignant neoplasm of digestive organs: Secondary | ICD-10-CM

## 2019-03-06 DIAGNOSIS — Z951 Presence of aortocoronary bypass graft: Secondary | ICD-10-CM

## 2019-03-06 DIAGNOSIS — G4733 Obstructive sleep apnea (adult) (pediatric): Secondary | ICD-10-CM | POA: Diagnosis present

## 2019-03-06 DIAGNOSIS — I480 Paroxysmal atrial fibrillation: Secondary | ICD-10-CM

## 2019-03-06 DIAGNOSIS — M791 Myalgia, unspecified site: Secondary | ICD-10-CM

## 2019-03-06 DIAGNOSIS — R9431 Abnormal electrocardiogram [ECG] [EKG]: Secondary | ICD-10-CM

## 2019-03-06 DIAGNOSIS — E1169 Type 2 diabetes mellitus with other specified complication: Secondary | ICD-10-CM | POA: Diagnosis present

## 2019-03-06 DIAGNOSIS — J209 Acute bronchitis, unspecified: Secondary | ICD-10-CM | POA: Diagnosis not present

## 2019-03-06 DIAGNOSIS — I110 Hypertensive heart disease with heart failure: Secondary | ICD-10-CM | POA: Diagnosis present

## 2019-03-06 DIAGNOSIS — Z20828 Contact with and (suspected) exposure to other viral communicable diseases: Secondary | ICD-10-CM | POA: Diagnosis present

## 2019-03-06 DIAGNOSIS — R059 Cough, unspecified: Secondary | ICD-10-CM

## 2019-03-06 DIAGNOSIS — I252 Old myocardial infarction: Secondary | ICD-10-CM

## 2019-03-06 DIAGNOSIS — J069 Acute upper respiratory infection, unspecified: Secondary | ICD-10-CM | POA: Diagnosis not present

## 2019-03-06 DIAGNOSIS — Z87891 Personal history of nicotine dependence: Secondary | ICD-10-CM

## 2019-03-06 DIAGNOSIS — E785 Hyperlipidemia, unspecified: Secondary | ICD-10-CM | POA: Diagnosis present

## 2019-03-06 DIAGNOSIS — K219 Gastro-esophageal reflux disease without esophagitis: Secondary | ICD-10-CM | POA: Diagnosis present

## 2019-03-06 DIAGNOSIS — Z880 Allergy status to penicillin: Secondary | ICD-10-CM

## 2019-03-06 DIAGNOSIS — Z8249 Family history of ischemic heart disease and other diseases of the circulatory system: Secondary | ICD-10-CM

## 2019-03-06 DIAGNOSIS — E1142 Type 2 diabetes mellitus with diabetic polyneuropathy: Secondary | ICD-10-CM

## 2019-03-06 DIAGNOSIS — Z825 Family history of asthma and other chronic lower respiratory diseases: Secondary | ICD-10-CM

## 2019-03-06 DIAGNOSIS — Z803 Family history of malignant neoplasm of breast: Secondary | ICD-10-CM

## 2019-03-06 DIAGNOSIS — Z7984 Long term (current) use of oral hypoglycemic drugs: Secondary | ICD-10-CM

## 2019-03-06 DIAGNOSIS — I5032 Chronic diastolic (congestive) heart failure: Secondary | ICD-10-CM

## 2019-03-06 LAB — CBC
HCT: 40.2 % (ref 36.0–46.0)
Hemoglobin: 13.6 g/dL (ref 12.0–15.0)
MCH: 31.6 pg (ref 26.0–34.0)
MCHC: 33.8 g/dL (ref 30.0–36.0)
MCV: 93.5 fL (ref 80.0–100.0)
Platelets: 229 10*3/uL (ref 150–400)
RBC: 4.3 MIL/uL (ref 3.87–5.11)
RDW: 11.9 % (ref 11.5–15.5)
WBC: 6.5 10*3/uL (ref 4.0–10.5)
nRBC: 0 % (ref 0.0–0.2)

## 2019-03-06 LAB — CBC WITH DIFFERENTIAL/PLATELET
Abs Immature Granulocytes: 0 10*3/uL (ref 0.00–0.07)
Basophils Absolute: 0.1 10*3/uL (ref 0.0–0.1)
Basophils Relative: 1 %
Eosinophils Absolute: 0.3 10*3/uL (ref 0.0–0.5)
Eosinophils Relative: 4 %
HCT: 40.8 % (ref 36.0–46.0)
Hemoglobin: 14 g/dL (ref 12.0–15.0)
Immature Granulocytes: 0 %
Lymphocytes Relative: 47 %
Lymphs Abs: 3 10*3/uL (ref 0.7–4.0)
MCH: 31.6 pg (ref 26.0–34.0)
MCHC: 34.3 g/dL (ref 30.0–36.0)
MCV: 92.1 fL (ref 80.0–100.0)
Monocytes Absolute: 0.6 10*3/uL (ref 0.1–1.0)
Monocytes Relative: 9 %
Neutro Abs: 2.5 10*3/uL (ref 1.7–7.7)
Neutrophils Relative %: 39 %
Platelets: 221 10*3/uL (ref 150–400)
RBC: 4.43 MIL/uL (ref 3.87–5.11)
RDW: 11.9 % (ref 11.5–15.5)
WBC: 6.5 10*3/uL (ref 4.0–10.5)
nRBC: 0 % (ref 0.0–0.2)

## 2019-03-06 LAB — BASIC METABOLIC PANEL
Anion gap: 10 (ref 5–15)
Anion gap: 11 (ref 5–15)
BUN: 8 mg/dL (ref 8–23)
BUN: 8 mg/dL (ref 8–23)
CO2: 23 mmol/L (ref 22–32)
CO2: 23 mmol/L (ref 22–32)
Calcium: 9.2 mg/dL (ref 8.9–10.3)
Calcium: 9 mg/dL (ref 8.9–10.3)
Chloride: 103 mmol/L (ref 98–111)
Chloride: 102 mmol/L (ref 98–111)
Creatinine, Ser: 0.61 mg/dL (ref 0.44–1.00)
Creatinine, Ser: 0.66 mg/dL (ref 0.44–1.00)
GFR calc Af Amer: >60 mL/min (ref >60)
GFR calc Af Amer: >60 mL/min (ref >60)
GFR calc non Af Amer: >60 mL/min (ref >60)
GFR calc non Af Amer: >60 mL/min (ref >60)
Glucose, Bld: 144 mg/dL — ABNORMAL HIGH (ref 70–99)
Glucose, Bld: 145 mg/dL — ABNORMAL HIGH (ref 70–99)
Potassium: 3.9 mmol/L (ref 3.5–5.1)
Potassium: 3.6 mmol/L (ref 3.5–5.1)
Sodium: 136 mmol/L (ref 135–145)
Sodium: 136 mmol/L (ref 135–145)

## 2019-03-06 LAB — RAPID INFLUENZA A&B ANTIGENS
Influenza A (ARMC): NEGATIVE
Influenza B (ARMC): NEGATIVE

## 2019-03-06 LAB — TROPONIN I (HIGH SENSITIVITY)
Troponin I (High Sensitivity): 7 ng/L (ref <18)
Troponin I (High Sensitivity): 9 ng/L (ref <18)
Troponin I (High Sensitivity): 8 ng/L (ref <18)

## 2019-03-06 LAB — RESPIRATORY PANEL BY RT PCR (FLU A&B, COVID)
Influenza A by PCR: NEGATIVE
Influenza B by PCR: NEGATIVE
SARS Coronavirus 2 by RT PCR: NEGATIVE

## 2019-03-06 MED ORDER — FAMOTIDINE 20 MG PO TABS
40.0000 mg | ORAL_TABLET | Freq: Every day | ORAL | Status: DC
  Administered 2019-03-07 – 2019-03-09 (×3): 40 mg via ORAL
  Filled 2019-03-06 (×3): qty 2

## 2019-03-06 MED ORDER — ASPIRIN 81 MG PO CHEW
324.0000 mg | CHEWABLE_TABLET | Freq: Once | ORAL | Status: AC
  Administered 2019-03-06: 324 mg via ORAL

## 2019-03-06 MED ORDER — HYDROCOD POLST-CPM POLST ER 10-8 MG/5ML PO SUER
5.0000 mL | Freq: Once | ORAL | Status: AC
  Administered 2019-03-06: 5 mL via ORAL
  Filled 2019-03-06: qty 5

## 2019-03-06 MED ORDER — ASPIRIN 81 MG PO CHEW
81.0000 mg | CHEWABLE_TABLET | Freq: Every day | ORAL | Status: DC
  Administered 2019-03-07 – 2019-03-09 (×3): 81 mg via ORAL
  Filled 2019-03-06 (×3): qty 1

## 2019-03-06 MED ORDER — SEMAGLUTIDE(0.25 OR 0.5MG/DOS) 2 MG/1.5ML ~~LOC~~ SOPN: 0.2500 mg | PEN_INJECTOR | SUBCUTANEOUS | Status: DC

## 2019-03-06 MED ORDER — ALUM & MAG HYDROXIDE-SIMETH 200-200-20 MG/5ML PO SUSP
30.0000 mL | Freq: Once | ORAL | Status: AC
  Administered 2019-03-06: 30 mL via ORAL
  Filled 2019-03-06: qty 30

## 2019-03-06 MED ORDER — ISOSORBIDE MONONITRATE ER 30 MG PO TB24
30.0000 mg | ORAL_TABLET | Freq: Every day | ORAL | Status: DC
  Filled 2019-03-06: qty 1

## 2019-03-06 MED ORDER — PANTOPRAZOLE SODIUM 20 MG PO TBEC
20.0000 mg | DELAYED_RELEASE_TABLET | Freq: Two times a day (BID) | ORAL | Status: DC
  Administered 2019-03-07 – 2019-03-09 (×5): 20 mg via ORAL
  Filled 2019-03-06 (×8): qty 1

## 2019-03-06 MED ORDER — MORPHINE SULFATE (PF) 4 MG/ML IV SOLN
4.0000 mg | Freq: Once | INTRAVENOUS | Status: AC
  Administered 2019-03-06: 4 mg via INTRAVENOUS
  Filled 2019-03-06: qty 1

## 2019-03-06 MED ORDER — ACETAMINOPHEN 325 MG PO TABS
650.0000 mg | ORAL_TABLET | ORAL | Status: DC | PRN
  Administered 2019-03-07: 650 mg via ORAL
  Filled 2019-03-06 (×2): qty 2

## 2019-03-06 MED ORDER — NITROGLYCERIN 0.4 MG SL SUBL: 0.4000 mg | SUBLINGUAL_TABLET | SUBLINGUAL | Status: DC | PRN

## 2019-03-06 MED ORDER — FUROSEMIDE 20 MG PO TABS
20.0000 mg | ORAL_TABLET | ORAL | Status: DC
  Administered 2019-03-07 – 2019-03-09 (×2): 20 mg via ORAL
  Filled 2019-03-06 (×2): qty 1

## 2019-03-06 MED ORDER — ZOLPIDEM TARTRATE 5 MG PO TABS
5.0000 mg | ORAL_TABLET | Freq: Every evening | ORAL | Status: DC | PRN
  Administered 2019-03-06: 5 mg via ORAL
  Filled 2019-03-06: qty 1

## 2019-03-06 MED ORDER — SODIUM CHLORIDE 0.9 % IV SOLN
INTRAVENOUS | Status: DC
  Administered 2019-03-06 – 2019-03-09 (×4): via INTRAVENOUS

## 2019-03-06 MED ORDER — METOPROLOL TARTRATE 50 MG PO TABS
100.0000 mg | ORAL_TABLET | Freq: Two times a day (BID) | ORAL | Status: DC
  Administered 2019-03-06 – 2019-03-09 (×6): 100 mg via ORAL
  Filled 2019-03-06 (×6): qty 2

## 2019-03-06 MED ORDER — ONDANSETRON HCL 4 MG/2ML IJ SOLN
4.0000 mg | Freq: Four times a day (QID) | INTRAMUSCULAR | Status: DC | PRN
  Administered 2019-03-08 – 2019-03-09 (×2): 4 mg via INTRAVENOUS
  Filled 2019-03-06 (×2): qty 2

## 2019-03-06 MED ORDER — ENOXAPARIN SODIUM 40 MG/0.4ML ~~LOC~~ SOLN
40.0000 mg | SUBCUTANEOUS | Status: DC
  Administered 2019-03-06 – 2019-03-08 (×3): 40 mg via SUBCUTANEOUS
  Filled 2019-03-06 (×3): qty 0.4

## 2019-03-06 MED ORDER — ATORVASTATIN CALCIUM 80 MG PO TABS
80.0000 mg | ORAL_TABLET | Freq: Every day | ORAL | Status: DC
  Administered 2019-03-07 – 2019-03-08 (×2): 80 mg via ORAL
  Filled 2019-03-06 (×3): qty 1

## 2019-03-06 MED ORDER — LIDOCAINE VISCOUS HCL 2 % MT SOLN
15.0000 mL | Freq: Once | OROMUCOSAL | Status: AC
  Administered 2019-03-06: 15 mL via ORAL
  Filled 2019-03-06: qty 15

## 2019-03-06 MED ORDER — ONDANSETRON 8 MG PO TBDP
8.0000 mg | ORAL_TABLET | Freq: Three times a day (TID) | ORAL | Status: DC | PRN
  Filled 2019-03-06: qty 1

## 2019-03-06 MED ORDER — ATORVASTATIN CALCIUM 20 MG PO TABS: 20.0000 mg | ORAL_TABLET | Freq: Every day | ORAL | Status: DC

## 2019-03-06 NOTE — ED Triage Notes (Signed)
Pt arrived via EMS from UC with c/o cough, SOB and lightheadedness x1 week. Pt has EKG changes and was given 1 nitro spray as well as Asprin in route. Pt 6/10 on chest pain on arrival to ED. Pt tested at [facility] clinic on Friday with negative COVID. MD at bedside.

## 2019-03-06 NOTE — ED Provider Notes (Signed)
[facility] Emergency Department Provider Note       Time seen: ----------------------------------------- 7:14 PM on 03/06/2019 -----------------------------------------   I have reviewed the triage vital signs and the nursing notes.  HISTORY   Chief Complaint No chief complaint on file.    HPI Candace [name] is a 68 y.o. female with a history of allergies, anemia, anxiety, coronary disease, diabetes, hyperlipidemia, hypertension, MI, CVA who presents to the ED for chest pain.  Patient reports intermittent chest pain over the last week or so.  She had seen her cardiologist who states her EKG had changed and they were going to schedule for a stress test.  She was given a spray of nitroglycerin with some improvement in her pain.  Current pain is 7 out of 10.  Past Medical History:  Diagnosis Date  . Allergy   . Anemia   . Anxiety   . Arrhythmia   . Arthritis   . Coronary artery disease   . Depression   . Diabetes mellitus without complication ([place])   . Dyspnea    doe  . Dysrhythmia   . GERD (gastroesophageal reflux disease)   . Headache   . History of hiatal hernia   . Hyperlipidemia   . Hypertension   . Myocardial infarction ([place])    2016, 04/2017  . Myocardial infarction with cardiac rehabilitation [facility])    MI 2016/ CABG 8/17    FINISHED CARDIAC REHAB 3 WEEKS AGO  . Panic attack   . Pneumonia   . Reflux   . Stroke [facility]) 2015   showed up on MRI; no weakness noted  . TIA (transient ischemic attack)   . Voice tremor     Patient Active Problem List   Diagnosis Date Noted  . Esophageal dysphagia   . Stomach irritation   . Gastric polyp   . Esophageal lump   . Hx of colonic polyp   . Polyp of colon   . Diverticulosis of large intestine without diverticulitis   . PSVT (paroxysmal supraventricular tachycardia) ([place]) 10/03/2018  . Abnormal ECG 07/05/2018  . Tachycardia 03/27/2018  . Pain in limb 02/28/2018  . Acute on chronic diastolic  CHF (congestive heart failure), NYHA class 3 ([place]) 11/03/2017  . TIA (transient ischemic attack) 11/03/2017  . COPD suggested by initial evaluation ([place]) 08/23/2017  . Acute and chronic respiratory failure with hypoxia ([place]) 08/23/2017  . Postoperative state 08/15/2017  . Incidental pulmonary nodule 08/01/2017  . Non-ST elevation myocardial infarction (NSTEMI), subendocardial infarction, subsequent episode of care ([place]) 06/01/2017  . B12 deficiency 12/01/2016  . Vitamin D deficiency 11/04/2016  . Depression with anxiety 09/30/2016  . PAF (paroxysmal atrial fibrillation) ([place]) 09/30/2016  . Resting tremor 09/30/2016  . Mild obstructive sleep apnea 09/30/2016  . Headache 08/18/2016  . Unstable angina ([place]) 08/03/2016  . Diabetes ([place]) 08/03/2016  . Asthma 11/11/2015  . S/P CABG x 4 11/10/2015  . Coronary artery disease 11/07/2015  . Carotid artery narrowing 02/08/2014  . Chest pain 02/08/2014  . Mixed hyperlipidemia 02/08/2014  . Atypical chest pain 02/07/2014  . Essential (primary) hypertension 02/07/2014  . Awareness of heartbeats 02/07/2014  . D (diarrhea) 10/05/2013  . Acid reflux 10/05/2013    Past Surgical History:  Procedure Laterality Date  . ABDOMINAL HYSTERECTOMY    . APPENDECTOMY  1975  . ARTERY BIOPSY Right 04/26/2016   Procedure: BIOPSY TEMPORAL ARTERY;  Surgeon: Margaretha Sheffield, MD;  Location: [facility] ORS;  Service: ENT;  Laterality: Right;  .  CARDIAC CATHETERIZATION N/A 11/06/2015   Procedure: Left Heart Cath and Coronary Angiography;  Surgeon: Corey Skains, MD;  Location: [place] [facility];  Service: Cardiovascular;  Laterality: N/A;  . CESAREAN SECTION    . COLONOSCOPY  2015  . COLONOSCOPY WITH PROPOFOL N/A 11/03/2018   Procedure: COLONOSCOPY WITH PROPOFOL;  Surgeon: Virgel Manifold, MD;  Location: [facility] ENDOSCOPY;  Service: Endoscopy;  Laterality: N/A;  . CORONARY ANGIOPLASTY  04/2017   [place]  . CORONARY ARTERY BYPASS GRAFT N/A 11/10/2015    Procedure: CORONARY ARTERY BYPASS GRAFTING (CABG), ON PUMP, TIMES FOUR, USING LEFT INTERNAL MAMMARY ARTERY, BILATERAL GREATER SAPHENOUS VEINS HARVESTED ENDOSCOPICALLY;  Surgeon: [name], MD;  Location: [place];  Service: Open Heart Surgery;  Laterality: N/A;  LIMA-LAD; SEQ SVG-OM1-OM2; SVG-PL  . CORONARY STENT INTERVENTION N/A 08/05/2016   Procedure: Coronary Stent Intervention;  Surgeon: Isaias Cowman, MD;  Location: [place] [facility];  Service: Cardiovascular;  Laterality: N/A;  . ESOPHAGOGASTRODUODENOSCOPY (EGD) WITH PROPOFOL N/A 11/03/2018   Procedure: ESOPHAGOGASTRODUODENOSCOPY (EGD) WITH PROPOFOL;  Surgeon: Virgel Manifold, MD;  Location: [facility] END[facility];  Service: Endoscopy;  Laterality: N/A;  . HYSTERECTOMY ABDOMINAL WITH SALPINGO-OOPHORECTOMY Bilateral 08/15/2017   Procedure: HYSTERECTOMY ABDOMINAL WITH BILATERAL SALPINGO-OOPHORECTOMY;  Surgeon: Rubie Maid, MD;  Location: [facility] ORS;  Service: Gynecology;  Laterality: Bilateral;  . LEFT HEART CATH AND CORONARY ANGIOGRAPHY N/A 08/05/2016   Procedure: Left Heart Cath and Coronary Angiography;  Surgeon: Isaias Cowman, MD;  Location: [place] [facility];  Service: Cardiovascular;  Laterality: N/A;  . LEFT HEART CATH AND CORS/GRAFTS ANGIOGRAPHY N/A 09/19/2018   Procedure: LEFT HEART CATH AND CORS/GRAFTS ANGIOGRAPHY;  Surgeon: Corey Skains, MD;  Location: [place] [facility];  Service: Cardiovascular;  Laterality: N/A;  . OOPHORECTOMY    . TEE WITHOUT CARDIOVERSION N/A 11/10/2015   Procedure: TRANSESOPHAGEAL ECHOCARDIOGRAM (TEE);  Surgeon: [name], MD;  Location: [place];  Service: Open Heart Surgery;  Laterality: N/A;  . TUBAL LIGATION      Allergies Lisinopril and Penicillins  Social History Social History   Tobacco Use  . Smoking status: Former Smoker    Packs/day: 0.50    Types: Cigarettes    Quit date: 10/07/2001    Years since quitting: 17.4  . Smokeless tobacco: Never Used  Substance Use  Topics  . Alcohol use: No    Alcohol/week: 0.0 standard drinks  . Drug use: No   Review of Systems Constitutional: Negative for fever. Cardiovascular: Positive for chest pain Respiratory: Negative for shortness of breath.  Positive for cough Gastrointestinal: Negative for abdominal pain, vomiting and diarrhea. Musculoskeletal: Negative for back pain. Skin: Negative for rash. Neurological: Negative for headaches, focal weakness or numbness.  All systems negative/normal/unremarkable except as stated in the HPI  ____________________________________________   PHYSICAL EXAM:  VITAL SIGNS: ED Triage Vitals  Enc Vitals Group     BP      Pulse      Resp      Temp      Temp src      SpO2      Weight      Height      Head Circumference      Peak Flow      Pain Score      Pain Loc      Pain Edu?      Excl. in [place]?     Constitutional: Alert and oriented. Well appearing and in no distress. Eyes: Conjunctivae are  normal. Normal extraocular movements. ENT      Head: Normocephalic and atraumatic.      Nose: No congestion/rhinnorhea.      Mouth/Throat: Mucous membranes are moist.      Neck: No stridor. Cardiovascular: Normal rate, regular rhythm. No murmurs, rubs, or gallops. Respiratory: Normal respiratory effort without tachypnea nor retractions. Breath sounds are clear and equal bilaterally. No wheezes/rales/rhonchi. Gastrointestinal: Soft and nontender. Normal bowel sounds Musculoskeletal: Nontender with normal range of motion in extremities. No lower extremity tenderness nor edema. Neurologic:  Normal speech and language. No gross focal neurologic deficits are appreciated.  Skin:  Skin is warm, dry and intact. No rash noted. Psychiatric: Mood and affect are normal. Speech and behavior are normal.  ____________________________________________  EKG: Interpreted by me.  Sinus rhythm with rate of 71 bpm, nonspecific ST segment changes, incomplete left bundle branch  block  ____________________________________________  ED COURSE:  As part of my medical decision making, I reviewed the following data within the [place] History obtained from family if available, nursing notes, old chart and ekg, as well as notes from prior ED visits. Patient presented for chest pain, we will assess with labs and imaging as indicated at this time.   Procedures  Candace [name] was evaluated in Emergency Department on 03/06/2019 for the symptoms described in the history of present illness. She was evaluated in the context of the global COVID-19 pandemic, which necessitated consideration that the patient might be at risk for infection with the SARS-CoV-2 virus that causes COVID-19. Institutional protocols and algorithms that pertain to the evaluation of patients at risk for COVID-19 are in a state of rapid change based on information released by regulatory bodies including the CDC and federal and state organizations. These policies and algorithms were followed during the patient's care in the ED.  ____________________________________________   LABS (pertinent positives/negatives)  Labs Reviewed  BASIC METABOLIC PANEL - Abnormal; Notable for the following components:      Result Value   Glucose, Bld 145 (*)    All other components within normal limits  RESPIRATORY PANEL BY RT PCR (FLU A&B, COVID)  CBC  TROPONIN I (HIGH SENSITIVITY)  TROPONIN I (HIGH SENSITIVITY)    RADIOLOGY Images were viewed by me  Chest x-ray IMPRESSION:  No evidence of active cardiopulmonary disease.  ____________________________________________   DIFFERENTIAL DIAGNOSIS   Unstable angina, MI, PE, COVID-19, pneumonia  FINAL ASSESSMENT AND PLAN  Chest pain   Plan: The patient had presented for chest pain with EKG changes. Patient's labs do not reveal any acute process. Patient's imaging was overall reassuring.  Her EKG does show some changes compared to recently.   Cardiology notes report need to perform a stress test.  I will discuss with the hospitalist for admission.   Laurence Aly, MD    Note: This note was generated in part or whole with voice recognition software. Voice recognition is usually quite accurate but there are transcription errors that can and very often do occur. I apologize for any typographical errors that were not detected and corrected.     Earleen Newport, MD 03/06/19 2214

## 2019-03-06 NOTE — Discharge Instructions (Signed)
EMS taking directly to [facility] for further evaluation.   Honor Loh, MSN, APRN, FNP-C, CEN Advanced Practice Provider [facility]

## 2019-03-06 NOTE — ED Provider Notes (Signed)
Candace [name], [place]   Name: Candace [name] DOB: [birth year] MRN: [id number] CSN: [id number] PCP: Candace [name]., MD  Arrival date and time:  03/06/19 1638  Chief Complaint:  Generalized Body Aches, Fatigue, Chest Pain, Shortness of Breath, and Cough   NOTE: Prior to seeing the patient today, I have reviewed the triage nursing documentation and vital signs. Clinical staff has updated patient's PMH/PSHx, current medication list, and drug allergies/intolerances to ensure comprehensive history available to assist in medical decision making.   History:   HPI: Candace [name] is a 68 y.o. female who presents today with complaints of retrosternal chest pain, shortness of breath, lightheadedness, diffuse myalgias, and upper respiratory symptoms (cough, sneezing, sore throat) that has been going on for a little over a week. Cough productive of thick white sputum. Patient feels as if Candace symptoms worsened some today. She has been taking APAP and Robitussin without significant improvement in Candace symptoms. Patient unsure about fevers citing that she has not measured Candace temperature and that she has been using APAP regularly. She notes recent SARS-CoV-2 (novel coronavirus) testing on 03/02/2019 that was negative. Patient was seen via a tele-health visit today by a provider working with Candace PCP. Given Candace symptoms and significant cardiac history, patient was directed to go to the emergency room, however she refused and elected to come to the urgent care instead.   Patient presents today for further evaluation of the aforementioned symptoms. She was seen by Candace cardiologist last week and advised that there were EKG changes that necessitated further evaluation via a myoview stress test; has not been scheduled yet. Patient ambulatory into clinic this afternoon with increased exertional dyspnea noted. Patient holding the center of Candace chest at the time she was seen for assessment.  Patient describes the pain as  having a sharp quality. Patient denies any blunt force trauma to Candace anterior Candace lateral chest wall. She has been coughing for over a week, and has been attributing Candace pain to Candace forceful coughing. She is short of breath both at rest and with exertion. She denies radiation of the pain into Candace shoulder, neck, jaw, and subscapular areas. Patient is not having any pain in Candace LEFT upper extremity. Candace [name], Candace [name], Candace [name]. Patient endorses a history of gastrointestinal reflux; takes famotidine daily. Pain is not reproducible with movement Candace palpation. She notes some increased pain with deep inspiration. Patient indicates that the pain is not relieved Candace significantly improved by rest. Patient has experienced similar episodes of pain in Candace chest in the past.   Patient with a past medical history significant for known cardiac problems including: CAD, MI, acute on chronic CHF, PSVT, A.fib, atypical angina, and a CABG x 4 (2017). Cardiac risk factors for this patient includes T2DM, HLD, HTN, TIA, CVA, PVD, anemia, OSA, and a BMI of 31.09 kg/m.  She has never been diagnosed with a DVT Candace PE in the past. She is not on daily anticoagulation therapy. Patient does take aspirin on a daily basis.    Past Medical History:  Diagnosis Date   Allergy    Anemia    Anxiety    Arrhythmia    Arthritis    Coronary artery disease    Depression    Diabetes mellitus without complication ([place])    Dyspnea    doe   Dysrhythmia    GERD (gastroesophageal reflux disease)    Headache    History of hiatal hernia    Hyperlipidemia  Hypertension    Myocardial infarction [facility])    2016, 04/2017   Myocardial infarction with cardiac rehabilitation [facility])    MI 2016/ CABG 8/17    FINISHED CARDIAC REHAB 3 WEEKS AGO   Panic attack    Pneumonia    Reflux    Stroke ([place]) 2015   showed up on MRI; no weakness noted   TIA (transient ischemic attack)    Voice tremor      Past Surgical History:  Procedure Laterality Date   ABDOMINAL HYSTERECTOMY     APPENDECTOMY  1975   ARTERY BIOPSY Right 04/26/2016   Procedure: BIOPSY TEMPORAL ARTERY;  Surgeon: Margaretha Sheffield, MD;  Location: [facility] ORS;  Service: ENT;  Laterality: Right;   CARDIAC CATHETERIZATION N/A 11/06/2015   Procedure: Left Heart Cath and Coronary Angiography;  Surgeon: Corey Skains, MD;  Location: [place] CV LAB;  Service: Cardiovascular;  Laterality: N/A;   CESAREAN [name]     COLONOSCOPY  2015   COLONOSCOPY WITH PROPOFOL N/A 11/03/2018   Procedure: COLONOSCOPY WITH PROPOFOL;  Surgeon: Virgel Manifold, MD;  Location: [facility] ENDOSCOPY;  Service: Endoscopy;  Laterality: N/A;   CORONARY ANGIOPLASTY  04/2017   [place]   CORONARY ARTERY BYPASS GRAFT N/A 11/10/2015   Procedure: CORONARY ARTERY BYPASS GRAFTING (CABG), ON PUMP, TIMES FOUR, USING LEFT INTERNAL MAMMARY ARTERY, BILATERAL GREATER SAPHENOUS VEINS HARVESTED ENDOSCOPICALLY;  Surgeon: [name], MD;  Location: [place];  Service: Open Heart Surgery;  Laterality: N/A;  LIMA-LAD; SEQ SVG-OM1-OM2; SVG-PL   CORONARY STENT INTERVENTION N/A 08/05/2016   Procedure: Coronary Stent Intervention;  Surgeon: Isaias Cowman, MD;  Location: [place] CV LAB;  Service: Cardiovascular;  Laterality: N/A;   ESOPHAGOGASTRODUODENOSCOPY (EGD) WITH PROPOFOL N/A 11/03/2018   Procedure: ESOPHAGOGASTRODUODENOSCOPY (EGD) WITH PROPOFOL;  Surgeon: Virgel Manifold, MD;  Location: [facility] ENDOS[facility];  Service: Endoscopy;  Laterality: N/A;   HYSTERECTOMY ABDOMINAL WITH SALPINGO-OOPHORECTOMY Bilateral 08/15/2017   Procedure: HYSTERECTOMY ABDOMINAL WITH BILATERAL SALPINGO-OOPHORECTOMY;  Surgeon: Rubie Maid, MD;  Location: [facility] ORS;  Service: Gynecology;  Laterality: Bilateral;   LEFT HEART CATH AND CORONARY ANGIOGRAPHY N/A 08/05/2016   Procedure: Left Heart Cath and Coronary Angiography;  Surgeon: Isaias Cowman, MD;   Location: [place] CV LAB;  Service: Cardiovascular;  Laterality: N/A;   LEFT HEART CATH AND CORS/GRAFTS ANGIOGRAPHY N/A 09/19/2018   Procedure: LEFT HEART CATH AND CORS/GRAFTS ANGIOGRAPHY;  Surgeon: Corey Skains, MD;  Location: [place] CV LAB;  Service: Cardiovascular;  Laterality: N/A;   OOPHORECTOMY     TEE WITHOUT CARDIOVERSION N/A 11/10/2015   Procedure: TRANSESOPHAGEAL ECHOCARDIOGRAM (TEE);  Surgeon: [name], MD;  Location: [place];  Service: Open Heart Surgery;  Laterality: N/A;   TUBAL LIGATION      Family History  Problem Relation Age of Onset   Cancer Father    Hypertension Father    Heart disease Father    Asthma Father    Cancer Mother    Hypertension Mother    Pancreatic cancer Mother 14   Cancer Sister    Breast cancer Sister 84   Breast cancer Sister 61   Lung cancer Brother    Pancreatic cancer Sister 102   Cancer Sister     Social History   Tobacco Use   Smoking status: Former Smoker    Packs/day: 0.50    Types: Cigarettes    Quit date: 10/07/2001    Years since quitting: 17.4   Smokeless tobacco: Never Used  Substance Use Topics  Alcohol use: No    Alcohol/week: 0.0 standard drinks   Drug use: No    Patient Active Problem List   Diagnosis Date Noted   Esophageal dysphagia    Stomach irritation    Gastric polyp    Esophageal lump    Hx of colonic polyp    Polyp of colon    Diverticulosis of large intestine without diverticulitis    PSVT (paroxysmal supraventricular tachycardia) ([place]) 10/03/2018   Abnormal ECG 07/05/2018   Tachycardia 03/27/2018   Pain in limb 02/28/2018   Acute on chronic diastolic CHF (congestive heart failure), NYHA class 3 ([place]) 11/03/2017   TIA (transient ischemic attack) 11/03/2017   COPD suggested by initial evaluation ([place]) 08/23/2017   Acute and chronic respiratory failure with hypoxia ([place]) 08/23/2017   Postoperative state 08/15/2017   Incidental pulmonary  nodule 08/01/2017   Non-ST elevation myocardial infarction (NSTEMI), subendocardial infarction, subsequent episode of care ([place]) 06/01/2017   B12 deficiency 12/01/2016   Vitamin D deficiency 11/04/2016   Depression with anxiety 09/30/2016   PAF (paroxysmal atrial fibrillation) ([place]) 09/30/2016   Resting tremor 09/30/2016   Mild obstructive sleep apnea 09/30/2016   Headache 08/18/2016   Unstable angina ([place]) 08/03/2016   Diabetes ([place]) 08/03/2016   Asthma 11/11/2015   S/P CABG x 4 11/10/2015   Coronary artery disease 11/07/2015   Carotid artery narrowing 02/08/2014   Chest pain 02/08/2014   Mixed hyperlipidemia 02/08/2014   Atypical chest pain 02/07/2014   Essential (primary) hypertension 02/07/2014   Awareness of heartbeats 02/07/2014   D (diarrhea) 10/05/2013   Acid reflux 10/05/2013    Home Medications:    Current Meds  Medication Sig   acetaminophen (TYLENOL) 325 MG tablet Take 650 mg by mouth every 6 (six) hours as needed for moderate pain Candace headache.   atorvastatin (LIPITOR) 80 MG tablet Take 80 mg by mouth every evening.    [employer] ASPIRIN ADULT LOW DOSE 81 MG chewable tablet CHEW 1 TABLET (81 MG TOTAL) BY MOUTH DAILY. (Patient taking differently: Chew 81 mg by mouth daily. )   famotidine (PEPCID) 40 MG tablet Take 40 mg by mouth daily.    furosemide (LASIX) 20 MG tablet Take 20 mg by mouth every other day.    isosorbide mononitrate (IMDUR) 30 MG 24 hr tablet Take 2 tablets (60 mg total) by mouth daily. (Patient taking differently: Take 30 mg by mouth 2 (two) times a day. )   loperamide (IMODIUM) 2 MG capsule Take 2-4 mg by mouth as needed for diarrhea Candace loose stools.    metFORMIN (GLUCOPHAGE) 1000 MG tablet Take 1 tablet (1,000 mg total) by mouth 2 (two) times daily with a meal.   metoprolol tartrate (LOPRESSOR) 100 MG tablet Take 100 mg by mouth 2 (two) times daily.   nitroGLYCERIN (NITROSTAT) 0.4 MG SL tablet Place 1 tablet (0.4 mg total)  under the tongue every 5 (five) minutes as needed for chest pain.   ondansetron (ZOFRAN-ODT) 8 MG disintegrating tablet DISSOLVE 1 TABLET ON THE TONGUE EVERY 8 HOURS AS NEEDED FOR [name] Candace Candace [name]   pantoprazole (PROTONIX) 20 MG tablet Take 1 tablet (20 mg total) by mouth 2 (two) times daily.   Semaglutide,0.25 Candace 0.5MG /DOS, (OZEMPIC, 0.25 Candace 0.5 MG/DOSE,) 2 MG/1.5ML SOPN Inject 0.25 mg into the skin once a week.    Allergies:   Lisinopril and Penicillins  Review of Systems (ROS): Review of Systems  Constitutional: Positive for activity change (laying around/sleeping more). Negative for chills.  Unsure of fevers citing taking APAP regularly; temperature not measured  HENT: Positive for postnasal drip, sneezing and sore throat. Negative for congestion, ear pain and sinus pain.   Respiratory: Positive for cough and shortness of breath.   Cardiovascular: Positive for chest pain. Negative for palpitations.  Musculoskeletal: Positive for myalgias. Negative for back pain, neck pain and neck stiffness.  Neurological: Positive for weakness (generalized) and light-headedness. Negative for syncope and headaches.  All other systems reviewed and are negative.    Vital Signs: Today's Vitals   03/06/19 1718 03/06/19 1719 03/06/19 1724 03/06/19 1829  BP:   (!) 136/96   Pulse:   99   Resp:   20   Temp:   98.8 F (37.1 C)   TempSrc:   Oral   SpO2:   99%   Weight:  170 lb (77.1 kg)    Height:  5\' 2"  (1.575 m)    PainSc: 7    7     Physical Exam: Physical Exam  Constitutional: She is oriented to person, place, and time and well-developed, well-nourished, and in no distress.  HENT:  Head: Normocephalic and atraumatic.  Right Ear: Tympanic membrane normal.  Left Ear: Tympanic membrane normal.  Nose: Rhinorrhea present. No mucosal edema Candace sinus tenderness.  Mouth/Throat: Uvula is midline and mucous membranes are normal. Posterior oropharyngeal erythema (+) clear PND present. No  oropharyngeal exudate Candace posterior oropharyngeal edema.  Eyes: Pupils are equal, round, and reactive to light.  Neck: Normal range of motion. Neck supple.  Cardiovascular: Normal rate, regular rhythm, normal heart sounds and intact distal pulses.  Holding center of chest throughout interview and exam. Healed median sternotomy scar noted on exam.   Pulmonary/Chest: Effort normal and breath sounds normal. Tachypnea noted.  SOB at rest and with exertion. SPO2 in upper 90s on RA. (+) cough.   Abdominal: Soft. Normal appearance and bowel sounds are normal. She exhibits no distension, no abdominal bruit and no pulsatile midline mass. There is no abdominal tenderness.  Musculoskeletal: Normal range of motion.  Neurological: She is alert and oriented to person, place, and time. She displays weakness (generalized). Gait (unsteady) abnormal.  Skin: Skin is warm and dry. No rash noted. She is not diaphoretic.  Psychiatric: Mood, memory, affect and judgment normal.  Nursing note and vitals reviewed.   Urgent Care Treatments / Results:   LABS: PLEASE NOTE: all labs that were ordered this encounter are listed, however only abnormal results are displayed. Labs Reviewed  BASIC METABOLIC PANEL - Abnormal; Notable for the following components:      Result Value   Glucose, Bld 144 (*)    All other components within normal limits  RAPID INFLUENZA A&B ANTIGENS ([facility] ONLY)  CBC WITH DIFFERENTIAL/PLATELET  TROPONIN I (HIGH SENSITIVITY)    URGENT CARE ECG REPORT Date: 03/06/2019 Time ECG obtained: 1752 PM Rate: 95 bpm Rhythm: NSR with presumably new LBBB  Comparison: Acute changes noted when compared to previous tracing obtained on 12/07/2018.   RADIOLOGY: Dg Chest 2 View  Result Date: 03/06/2019 CLINICAL DATA:  Productive cough, shortness of breath. EXAM: CHEST - 2 VIEW COMPARISON:  December 07, 2018. FINDINGS: The heart size and mediastinal contours are within normal limits. Both lungs are clear.  Status post coronary bypass graft. No pneumothorax Candace pleural effusion is noted. The visualized skeletal structures are unremarkable. IMPRESSION: No active cardiopulmonary disease. Electronically Signed By: Marijo Conception M.D.   On: 03/06/2019 17:55    PROCEDURES: Procedures  MEDICATIONS  RECEIVED THIS VISIT: Medications  aspirin chewable tablet 324 mg (324 mg Oral Given 03/06/19 1809)    PERTINENT CLINICAL COURSE NOTES/UPDATES:   Initial Impression / Assessment and Plan / Urgent Care Course:  Pertinent labs & imaging results that were available during my care of the patient were personally reviewed by me and considered in my medical decision making (see lab/imaging [name] of note for values and interpretations).  Candace [name] is a 68 y.o. female who presents to [facility] today with complaints of Generalized Body Aches, Fatigue, Chest Pain, Shortness of Breath, and Cough  Patient presents to clinic today with an overall fatigued appearance. She does not appear to be in any acute distress. Presenting symptoms (see HPI) and exam as documented above. Patient sent by PCP's office for further work up acute upper respiratory symptoms following negative SARS-CoV-2 testing on 03/02/2019. In review of office note from PCP's office, patient with significant cardiac history and recent A.fib diagnosis. Given history and complaints of increased CP and SOB, patient was felt to require in-person evaluation. Workup in clinic as follows:   EKG demonstrates NSR with a presumably new LBBB; not present on last 12 lead on file performed on 12/07/2018. There are ST changes  concerning for inferolateral ischemia. Patient seeing cardiology; last evaluated in clinic last week. She was advised that she needed to have a myoview stress test.    Radiographs of the chest performed today revealed no acute cardiopulmonary process; no evidence of peribronchial thickening, areas of consolidation, Candace focal  infiltrates.   CBC, BMP, and hs-TnI all normal.    Rapid influenza diagnotic test (RIDT) resulted (-) for both the influenza A and B Ag.   Given history and current EKG changes, patient was found to have a heart score in the moderate risk range, indicating an increased risk of MACE. Patient's presenting symptoms today are felt to require further evaluation and possible intervention in a [name] that is capable of providing a higher level of care. Patient is going to need cardiac monitoring, serial cardiac enzyme trending, and cardiology consult. I advised Candace to anticipate being admitted to the hospital for further workup and testing, which will likely include the myoview that was discussed with Candace by Candace cardiologist. I discussed with patient that Candace [name], Candace [name]. After discussion, patient notes that she would like to be seen at [facility]. Given EKG changes and complaints of chest pain, I advised patient that she would need to be transported to the hospital via EMS in order to ensure Candace safety. Patient amenable to transport, further workup, and admission to the hospital. Patient given ASA 324 mg PO prior to EMS transport.  Patient report called to [facility] emergency department staff; spoke with charge nurse Candace Section, RN). Nurse was advised of patient's presenting complaints, assessment in clinic, findings from work up Candace far, and plans for patient to present there for ongoing evaluation and management of Candace symptoms. Questions fielded. Nurse advised to return a call to [facility] with any questions Candace concerns pertaining to the care that Candace [name] received here today. Hospital staff aware that patient will be presenting to their facility via EMS today. [place], FNP (PCP's office) contacted via [facility] secure messaging  with clinical status update, including plans for transport to [facility] for probable admission.  Final Clinical Impressions / Urgent Care Diagnoses:   Final diagnoses:  SOB (shortness of breath)  Chest pain, unspecified type  LBBB (left bundle branch block)  Fatigue, unspecified type  Lightheadedness    New Prescriptions:  [place] Controlled Substance Registry consulted? Not Applicable  Meds ordered this encounter  Medications   aspirin chewable tablet 324 mg    Recommended Follow up Care:   Follow-up Information    Go to  [facility].   Specialty: Emergency Medicine Why: EMS from urgent care to [facility] ED for further evaluation of symptoms. Anticipate admission. Contact information: [id number] [place] [phone]        NOTE: This note was prepared using Dragon dictation software along with smaller phrase technology. Despite my best ability to proofread, there is the potential that transcriptional errors may still occur from this process, and are completely unintentional.    Karen Kitchens, NP 03/06/19 2343

## 2019-03-06 NOTE — ED Notes (Signed)
Report given to cpod RN- pt going to room 37.

## 2019-03-06 NOTE — ED Triage Notes (Signed)
Pt c/o headache, sneezing, cough, SOB, body aches, lightheadadedness. She states her throat aches. Started about a week ago. She has been tested for covid twice and has been negative. She had a e visit today with PCP office today and told to come to urgent care.

## 2019-03-06 NOTE — H&P (Addendum)
[place] [facility] [place] NAME: Candace [name]    MR#:  [id number]  DATE OF BIRTH:  January 29, 1951  DATE OF ADMISSION:  03/06/2019  PRIMARY CARE PHYSICIAN: [name]., MD   REQUESTING/REFERRING PHYSICIAN: Lenise Arena, MD  CHIEF COMPLAINT:   Chief Complaint  Patient presents with  . Cough  . Chest Pain    HISTORY OF PRESENT ILLNESS:  Candace [name]  is a 68 y.o. African-American female with a known history of coronary artery disease, hypertension, dyslipidemia, type II obese mellitus and GERD, who presented to the emergency room acute onset midsternal chest pain felt as pressure and graded 7/10 in severity with associated nausea without vomiting or diaphoresis however with dyspnea.  She has been having mild cough with expectoration of clear sputum.  She denied any palpitations.  No leg pain or edema recent travels or surgeries.  She stated that she has been aching all over.  No loss of taste or smell or generalized weakness.  Upon presentation to the emergency room, blood pressure was 136/96 with otherwise normal vital signs.  Labs revealed unremarkable CMP.  Troponin hours 7 later 9 then 8.  CBC was within normal.  Influenza AMB antigens came back negative as well as COVID-19 PCR.  Portable chest ray showed no acute cardiopulmonary disease and twelve-lead EKG showed new onset incomplete left bundle branch block with T wave inversion inferolaterally and a rate of 71.  The patient was given 4 mg of IV morphine sulfate and Tussionex.  She will be admitted to an observation telemetry bed for further evaluation and management. PAST MEDICAL HISTORY:   Past Medical History:  Diagnosis Date  . Allergy   . Anemia   . Anxiety   . Arrhythmia   . Arthritis   . Coronary artery disease   . Depression   . Diabetes mellitus without complication ([place])   . Dyspnea    doe  . Dysrhythmia   . GERD (gastroesophageal reflux disease)   . Headache   . History of  hiatal hernia   . Hyperlipidemia   . Hypertension   . Myocardial infarction ([place])    2016, 04/2017  . Myocardial infarction with cardiac rehabilitation [facility])    MI 2016/ CABG 8/17    FINISHED CARDIAC REHAB 3 WEEKS AGO  . Panic attack   . Pneumonia   . Reflux   . Stroke [facility]) 2015   showed up on MRI; no weakness noted  . TIA (transient ischemic attack)   . Voice tremor     PAST SURGICAL HISTORY:   Past Surgical History:  Procedure Laterality Date  . ABDOMINAL HYSTERECTOMY    . APPENDECTOMY  1975  . ARTERY BIOPSY Right 04/26/2016   Procedure: BIOPSY TEMPORAL ARTERY;  Surgeon: Margaretha Sheffield, MD;  Location: [facility] ORS;  Service: ENT;  Laterality: Right;  . CARDIAC CATHETERIZATION N/A 11/06/2015   Procedure: Left Heart Cath and Coronary Angiography;  Surgeon: Corey Skains, MD;  Location: [place] CV [facility];  Service: Cardiovascular;  Laterality: N/A;  . CESAREAN SECTION    . COLONOSCOPY  2015  . COLONOSCOPY WITH PROPOFOL N/A 11/03/2018   Procedure: COLONOSCOPY WITH PROPOFOL;  Surgeon: Virgel Manifold, MD;  Location: [facility];  Service: Endoscopy;  Laterality: N/A;  . CORONARY ANGIOPLASTY  04/2017   [place]  . CORONARY ARTERY BYPASS GRAFT N/A 11/10/2015   Procedure: CORONARY ARTERY BYPASS GRAFTING (CABG), ON PUMP, TIMES FOUR, USING LEFT INTERNAL MAMMARY ARTERY,  BILATERAL GREATER SAPHENOUS VEINS HARVESTED ENDOSCOPICALLY;  Surgeon: [name], MD;  Location: [place];  Service: Open Heart Surgery;  Laterality: N/A;  LIMA-LAD; SEQ SVG-OM1-OM2; SVG-PL  . CORONARY STENT INTERVENTION N/A 08/05/2016   Procedure: Coronary Stent Intervention;  Surgeon: Isaias Cowman, MD;  Location: [place] [facility];  Service: Cardiovascular;  Laterality: N/A;  . ESOPHAGOGASTRODUODENOSCOPY (EGD) WITH PROPOFOL N/A 11/03/2018   Procedure: ESOPHAGOGASTRODUODENOSCOPY (EGD) WITH PROPOFOL;  Surgeon: Virgel Manifold, MD;  Location: [facility] END[facility];  Service: Endoscopy;   Laterality: N/A;  . HYSTERECTOMY ABDOMINAL WITH SALPINGO-OOPHORECTOMY Bilateral 08/15/2017   Procedure: HYSTERECTOMY ABDOMINAL WITH BILATERAL SALPINGO-OOPHORECTOMY;  Surgeon: Rubie Maid, MD;  Location: [facility];  Service: Gynecology;  Laterality: Bilateral;  . LEFT HEART CATH AND CORONARY ANGIOGRAPHY N/A 08/05/2016   Procedure: Left Heart Cath and Coronary Angiography;  Surgeon: Isaias Cowman, MD;  Location: [place] [facility];  Service: Cardiovascular;  Laterality: N/A;  . LEFT HEART CATH AND CORS/GRAFTS ANGIOGRAPHY N/A 09/19/2018   Procedure: LEFT HEART CATH AND CORS/GRAFTS ANGIOGRAPHY;  Surgeon: Corey Skains, MD;  Location: [place] [facility];  Service: Cardiovascular;  Laterality: N/A;  . OOPHORECTOMY    . TEE WITHOUT CARDIOVERSION N/A 11/10/2015   Procedure: TRANSESOPHAGEAL ECHOCARDIOGRAM (TEE);  Surgeon: [name], MD;  Location: [place];  Service: Open Heart Surgery;  Laterality: N/A;  . TUBAL LIGATION      SOCIAL HISTORY:   Social History   Tobacco Use  . Smoking status: Former Smoker    Packs/day: 0.50    Types: Cigarettes    Quit date: 10/07/2001    Years since quitting: 17.4  . Smokeless tobacco: Never Used  Substance Use Topics  . Alcohol use: No    Alcohol/week: 0.0 standard drinks    FAMILY HISTORY:   Family History  Problem Relation Age of Onset  . Cancer Father   . Hypertension Father   . Heart disease Father   . Asthma Father   . Cancer Mother   . Hypertension Mother   . Pancreatic cancer Mother 32  . Cancer Sister   . Breast cancer Sister 60  . Breast cancer Sister 39  . Lung cancer Brother   . Pancreatic cancer Sister 22  . Cancer Sister     DRUG ALLERGIES:   Allergies  Allergen Reactions  . Lisinopril Cough  . Penicillins Swelling, Rash and Other (See Comments)    Did it involve swelling of the face/tongue/throat, SOB, or low BP? Yes Did it involve sudden or severe rash/hives, skin peeling, or any reaction on the inside of  your mouth or nose? Yes Did you need to seek medical attention at a hospital or doctor's office? Yes When did it last happen?15 years If all above answers are "NO", may proceed with cephalosporin use.     REVIEW OF SYSTEMS:   ROS As per history of present illness. All pertinent systems were reviewed above. Constitutional,  HEENT, cardiovascular, respiratory, GI, GU, musculoskeletal, neuro, psychiatric, endocrine,  integumentary and hematologic systems were reviewed and are otherwise  negative/unremarkable except for positive findings mentioned above in the HPI.   MEDICATIONS AT HOME:   Prior to Admission medications   Medication Sig Start Date End Date Taking? Authorizing Provider  acetaminophen (TYLENOL) 325 MG tablet Take 650 mg by mouth every 6 (six) hours as needed for moderate pain or headache.   Yes [provider]  atorvastatin (LIPITOR) 80 MG tablet Take 80 mg by mouth every evening.  07/06/18  Yes  [provider]  [facility] ASPIRIN ADULT LOW DOSE 81 MG chewable tablet CHEW 1 TABLET (81 MG TOTAL) BY MOUTH DAILY. Patient taking differently: Chew 81 mg by mouth daily.  11/04/17  Yes [name]., MD  famotidine (PEPCID) 40 MG tablet Take 40 mg by mouth daily.  09/14/18  Yes [provider]  furosemide (LASIX) 20 MG tablet Take 20 mg by mouth every other day.    Yes [provider]  isosorbide mononitrate (IMDUR) 30 MG 24 hr tablet Take 2 tablets (60 mg total) by mouth daily. Patient taking differently: Take 30 mg by mouth 2 (two) times a day.  11/04/16  Yes Plonk, Gwyndolyn Saxon, MD  loperamide (IMODIUM) 2 MG capsule Take 2-4 mg by mouth as needed for diarrhea or loose stools.    Yes [provider]  metFORMIN (GLUCOPHAGE) 1000 MG tablet Take 1 tablet (1,000 mg total) by mouth 2 (two) times daily with a meal. 02/02/18  Yes [name]., MD  metoprolol tartrate (LOPRESSOR) 100 MG tablet Take 100 mg by mouth 2 (two) times daily.  10/03/18  Yes [provider]  nitroGLYCERIN (NITROSTAT) 0.4 MG SL tablet Place 1 tablet (0.4 mg total) under the tongue every 5 (five) minutes as needed for chest pain. 07/27/16  Yes Mody, Sital, MD  ondansetron (ZOFRAN-ODT) 8 MG disintegrating tablet DISSOLVE 1 TABLET ON THE TONGUE EVERY 8 HOURS AS NEEDED FOR NAUSEA OR VOMITING 01/09/19  Yes [name]., MD  pantoprazole (PROTONIX) 20 MG tablet Take 1 tablet (20 mg total) by mouth 2 (two) times daily. 07/18/18  Yes [name]., MD  Semaglutide,0.25 or 0.5MG /DOS, (OZEMPIC, 0.25 OR 0.5 MG/DOSE,) 2 MG/1.5ML SOPN Inject 0.25 mg into the skin once a week. 01/04/19  Yes [name]., MD  cetirizine (ZYRTEC) 5 MG tablet Take 2 tablets (10 mg total) by mouth daily. 12/19/18 03/06/19  [name]., MD      VITAL SIGNS:  Blood pressure (!) 168/89, pulse 79, temperature 99.9 F (37.7 C), temperature source Oral, resp. rate 18, SpO2 97 %.  PHYSICAL EXAMINATION:  Physical Exam  GENERAL:  68 y.o.-year-old African-American female patient lying in the bed with no acute distress.  EYES: Pupils equal, round, reactive to light and accommodation. No scleral icterus. Extraocular muscles intact.  HEENT: Head atraumatic, normocephalic. Oropharynx and nasopharynx clear.  NECK:  Supple, no jugular venous distention. No thyroid enlargement, no tenderness.  LUNGS: Normal breath sounds bilaterally, no wheezing, rales,rhonchi or crepitation. No use of accessory muscles of respiration.  CARDIOVASCULAR: Regular rate and rhythm, S1, S2 normal. No murmurs, rubs, or gallops.  ABDOMEN: Soft, nondistended, nontender. Bowel sounds present. No organomegaly or mass.  EXTREMITIES: No pedal edema, cyanosis, or clubbing.  NEUROLOGIC: Cranial nerves II through XII are intact. Muscle strength 5/5 in all extremities. Sensation intact. Gait not checked.  PSYCHIATRIC: The patient is alert and oriented x 3.  Normal affect and good eye  contact. SKIN: No obvious rash, lesion, or ulcer.   LABORATORY PANEL:   CBC Recent Labs  Lab 03/06/19 1925  WBC 6.5  HGB 13.6  HCT 40.2  PLT 229   ------------------------------------------------------------------------------------------------------------------  Chemistries  Recent Labs  Lab 03/06/19 1925  NA 136  K 3.6  CL 102  CO2 23  GLUCOSE 145*  BUN 8  CREATININE 0.66  CALCIUM 9.0   ------------------------------------------------------------------------------------------------------------------  Cardiac Enzymes No results for input(s): TROPONINI in the last 168 hours. ------------------------------------------------------------------------------------------------------------------  RADIOLOGY:  Dg Chest 2 View  Result Date: 03/06/2019 CLINICAL DATA:  Productive cough, shortness of breath. EXAM: CHEST - 2 VIEW COMPARISON:  December 07, 2018. FINDINGS: The heart size and mediastinal contours are within normal limits. Both lungs are clear. Status post coronary bypass graft. No pneumothorax or pleural effusion is noted. The visualized skeletal structures are unremarkable. IMPRESSION: No active cardiopulmonary disease. Electronically Signed   By: Marijo Conception M.D.   On: 03/06/2019 17:55   Dg Chest Port 1 View  Result Date: 03/06/2019 CLINICAL DATA:  Chest pain and cough. EXAM: PORTABLE CHEST 1 VIEW COMPARISON:  03/06/2019 and prior radiographs FINDINGS: The cardiomediastinal silhouette is unremarkable. CABG changes again noted. There is no evidence of focal airspace disease, pulmonary edema, suspicious pulmonary nodule/mass, pleural effusion, or pneumothorax. No acute bony abnormalities are identified. Degenerative changes in both shoulders again identified. IMPRESSION: No evidence of active cardiopulmonary disease. Electronically Signed   By: Margarette Canada M.D.   On: 03/06/2019 19:46      IMPRESSION AND PLAN:  1. Chest pain, rule out acute coronary syndrome with new  onset incomplete left bundle branch block.  The patient will be admitted to an observation telemetry bed.  Will follow serial cardiac enzymes and EKGs.  We will obtain a cardiology consult in a.m. for further cardiac risk stratification.  The patient will be placed on aspirin as well as p.r.n. sublingual nitroglycerin and morphine sulfate for pain.  I notified Dr. Saralyn Pilar about the patient.  Differential diagnosis would include musculoskeletal pain from cough.  Antitussives will be added.  2.  Coronary artery disease.  Management as above.  We will continue Imdur and statin therapy as well as beta-blocker therapy with Lopressor.  3.  Paroxysmal atrial fibrillation..  We will continue Lopressor.  Apparently the patient is not a candidate for anticoagulation  4.  Chronic diastolic CHF.  No current exacerbation.  We will continue diuresis with Lasix.  5.  Essential hypertension.  We will continue Lopressor.  6.  Type 2 diabetes mellitus.  The patient will be placed on supplement coverage with NovoLog.  The patient's basal coverage will be continued.  We will hold off Metformin.  7.  DVT prophylaxis.  Subcutaneous Lovenox.  GI prophylaxis will be provided with PPI therapy for the possibility of GI etiology.   All the records are reviewed and case discussed with ED provider. The plan of care was discussed in details with the patient (and family). I answered all questions. The patient agreed to proceed with the above mentioned plan. Further management will depend upon hospital course.   CODE STATUS: I discussed the CODE STATUS with the patient and she desires to be full code. TOTAL TIME TAKING CARE OF THIS PATIENT: 50 minutes.    Christel Mormon M.D on 03/06/2019 at 10:32 PM  [facility]   From 7 PM-7 AM, contact night-coverage [email or web address]  CC: Primary care physician; [name]., MD   Note: This dictation was prepared with Dragon dictation along with smaller phrase  technology. Any transcriptional errors that result from this process are unintentional.

## 2019-03-06 NOTE — Patient Instructions (Signed)
Upper Respiratory Infection, Adult An upper respiratory infection (URI) affects the nose, throat, and upper air passages. URIs are caused by germs (viruses). The most common type of URI is often called "the common cold." Medicines cannot cure URIs, but you can do things at home to relieve your symptoms. URIs usually get better within 7-10 days. Follow these instructions at home: Activity  Rest as needed.  If you have a fever, stay home from work or school until your fever is gone, or until your doctor says you may return to work or school. ? You should stay home until you cannot spread the infection anymore (you are not contagious). ? Your doctor may have you wear a face mask so you have less risk of spreading the infection. Relieving symptoms  Gargle with a salt-water mixture 3-4 times a day or as needed. To make a salt-water mixture, completely dissolve -1 tsp of salt in 1 cup of warm water.  Use a cool-mist humidifier to add moisture to the air. This can help you breathe more easily. Eating and drinking   Drink enough fluid to keep your pee (urine) pale yellow.  Eat soups and other clear broths. General instructions   Take over-the-counter and prescription medicines only as told by your doctor. These include cold medicines, fever reducers, and cough suppressants.  Do not use any products that contain nicotine or tobacco. These include cigarettes and e-cigarettes. If you need help quitting, ask your doctor.  Avoid being where people are smoking (avoid secondhand smoke).  Make sure you get regular shots and get the flu shot every year.  Keep all follow-up visits as told by your doctor. This is important. How to avoid spreading infection to others   Wash your hands often with soap and water. If you do not have soap and water, use hand sanitizer.  Avoid touching your mouth, face, eyes, or nose.  Cough or sneeze into a tissue or your sleeve or elbow. Do not cough or sneeze  into your hand or into the air. Contact a doctor if:  You are getting worse, not better.  You have any of these: ? A fever. ? Chills. ? Brown or red mucus in your nose. ? Yellow or brown fluid (discharge)coming from your nose. ? Pain in your face, especially when you bend forward. ? Swollen neck glands. ? Pain with swallowing. ? White areas in the back of your throat. Get help right away if:  You have shortness of breath that gets worse.  You have very bad or constant: ? Headache. ? Ear pain. ? Pain in your forehead, behind your eyes, and over your cheekbones (sinus pain). ? Chest pain.  You have long-lasting (chronic) lung disease along with any of these: ? Wheezing. ? Long-lasting cough. ? Coughing up blood. ? A change in your usual mucus.  You have a stiff neck.  You have changes in your: ? Vision. ? Hearing. ? Thinking. ? Mood. Summary  An upper respiratory infection (URI) is caused by a germ called a virus. The most common type of URI is often called "the common cold."  URIs usually get better within 7-10 days.  Take over-the-counter and prescription medicines only as told by your doctor. This information is not intended to replace advice given to you by your health care provider. Make sure you discuss any questions you have with your health care provider. Document Released: 09/01/2007 Document Revised: 03/23/2018 Document Reviewed: 11/05/2016 Elsevier Patient Education  2020 [employer].

## 2019-03-07 ENCOUNTER — Observation Stay: Payer: Medicare Other

## 2019-03-07 ENCOUNTER — Observation Stay
Admit: 2019-03-07 | Discharge: 2019-03-07 | Disposition: A | Discharge status: Home / Self Care | Payer: Medicare Other | Attending: Family Medicine | Admitting: Family Medicine

## 2019-03-07 DIAGNOSIS — I480 Paroxysmal atrial fibrillation: Secondary | ICD-10-CM

## 2019-03-07 DIAGNOSIS — Z7984 Long term (current) use of oral hypoglycemic drugs: Secondary | ICD-10-CM | POA: Diagnosis not present

## 2019-03-07 DIAGNOSIS — R42 Dizziness and giddiness: Secondary | ICD-10-CM | POA: Diagnosis not present

## 2019-03-07 DIAGNOSIS — E782 Mixed hyperlipidemia: Secondary | ICD-10-CM | POA: Diagnosis present

## 2019-03-07 DIAGNOSIS — R2681 Unsteadiness on feet: Secondary | ICD-10-CM | POA: Diagnosis present

## 2019-03-07 DIAGNOSIS — I110 Hypertensive heart disease with heart failure: Secondary | ICD-10-CM | POA: Diagnosis present

## 2019-03-07 DIAGNOSIS — I252 Old myocardial infarction: Secondary | ICD-10-CM | POA: Diagnosis not present

## 2019-03-07 DIAGNOSIS — R079 Chest pain, unspecified: Secondary | ICD-10-CM

## 2019-03-07 DIAGNOSIS — Z880 Allergy status to penicillin: Secondary | ICD-10-CM | POA: Diagnosis not present

## 2019-03-07 DIAGNOSIS — Z87891 Personal history of nicotine dependence: Secondary | ICD-10-CM | POA: Diagnosis not present

## 2019-03-07 DIAGNOSIS — I5032 Chronic diastolic (congestive) heart failure: Secondary | ICD-10-CM | POA: Diagnosis present

## 2019-03-07 DIAGNOSIS — J209 Acute bronchitis, unspecified: Secondary | ICD-10-CM | POA: Diagnosis present

## 2019-03-07 DIAGNOSIS — E785 Hyperlipidemia, unspecified: Secondary | ICD-10-CM

## 2019-03-07 DIAGNOSIS — E1151 Type 2 diabetes mellitus with diabetic peripheral angiopathy without gangrene: Secondary | ICD-10-CM | POA: Diagnosis present

## 2019-03-07 DIAGNOSIS — R05 Cough: Secondary | ICD-10-CM | POA: Diagnosis present

## 2019-03-07 DIAGNOSIS — E1169 Type 2 diabetes mellitus with other specified complication: Secondary | ICD-10-CM

## 2019-03-07 DIAGNOSIS — I447 Left bundle-branch block, unspecified: Secondary | ICD-10-CM | POA: Diagnosis present

## 2019-03-07 DIAGNOSIS — Z800 Family history of malignant neoplasm of digestive organs: Secondary | ICD-10-CM | POA: Diagnosis not present

## 2019-03-07 DIAGNOSIS — Z801 Family history of malignant neoplasm of trachea, bronchus and lung: Secondary | ICD-10-CM | POA: Diagnosis not present

## 2019-03-07 DIAGNOSIS — Z20828 Contact with and (suspected) exposure to other viral communicable diseases: Secondary | ICD-10-CM | POA: Diagnosis present

## 2019-03-07 DIAGNOSIS — G4733 Obstructive sleep apnea (adult) (pediatric): Secondary | ICD-10-CM | POA: Diagnosis present

## 2019-03-07 DIAGNOSIS — Z951 Presence of aortocoronary bypass graft: Secondary | ICD-10-CM | POA: Diagnosis not present

## 2019-03-07 DIAGNOSIS — K219 Gastro-esophageal reflux disease without esophagitis: Secondary | ICD-10-CM | POA: Diagnosis present

## 2019-03-07 DIAGNOSIS — Z8673 Personal history of transient ischemic attack (TIA), and cerebral infarction without residual deficits: Secondary | ICD-10-CM | POA: Diagnosis not present

## 2019-03-07 DIAGNOSIS — Z825 Family history of asthma and other chronic lower respiratory diseases: Secondary | ICD-10-CM | POA: Diagnosis not present

## 2019-03-07 DIAGNOSIS — I2510 Atherosclerotic heart disease of native coronary artery without angina pectoris: Secondary | ICD-10-CM | POA: Diagnosis present

## 2019-03-07 DIAGNOSIS — Z803 Family history of malignant neoplasm of breast: Secondary | ICD-10-CM | POA: Diagnosis not present

## 2019-03-07 DIAGNOSIS — Z8249 Family history of ischemic heart disease and other diseases of the circulatory system: Secondary | ICD-10-CM | POA: Diagnosis not present

## 2019-03-07 LAB — HIV ANTIBODY (ROUTINE TESTING W REFLEX): HIV Screen 4th Generation wRfx: NONREACTIVE

## 2019-03-07 LAB — GLUCOSE, CAPILLARY
Glucose-Capillary: 104 mg/dL — ABNORMAL HIGH (ref 70–99)
Glucose-Capillary: 174 mg/dL — ABNORMAL HIGH (ref 70–99)
Glucose-Capillary: 256 mg/dL — ABNORMAL HIGH (ref 70–99)
Glucose-Capillary: 263 mg/dL — ABNORMAL HIGH (ref 70–99)

## 2019-03-07 LAB — ECHOCARDIOGRAM COMPLETE
Height: 62 in
Weight: 2720 oz

## 2019-03-07 LAB — HEMOGLOBIN A1C
Hgb A1c MFr Bld: 9.5 % — ABNORMAL HIGH (ref 4.8–5.6)
Mean Plasma Glucose: 225.95 mg/dL

## 2019-03-07 MED ORDER — OXYCODONE HCL 5 MG PO TABS
5.0000 mg | ORAL_TABLET | ORAL | Status: DC | PRN
  Administered 2019-03-07 – 2019-03-08 (×5): 5 mg via ORAL
  Filled 2019-03-07 (×6): qty 1

## 2019-03-07 MED ORDER — METHYLPREDNISOLONE SODIUM SUCC 40 MG IJ SOLR
40.0000 mg | Freq: Every day | INTRAMUSCULAR | Status: DC
  Administered 2019-03-07 – 2019-03-09 (×3): 40 mg via INTRAVENOUS
  Filled 2019-03-07 (×5): qty 1

## 2019-03-07 MED ORDER — INSULIN ASPART 100 UNIT/ML ~~LOC~~ SOLN
0.0000 [IU] | Freq: Three times a day (TID) | SUBCUTANEOUS | Status: DC
  Administered 2019-03-07: 2 [IU] via SUBCUTANEOUS
  Administered 2019-03-07: 5 [IU] via SUBCUTANEOUS
  Administered 2019-03-08: 2 [IU] via SUBCUTANEOUS
  Administered 2019-03-08 (×2): 1 [IU] via SUBCUTANEOUS
  Administered 2019-03-09: 2 [IU] via SUBCUTANEOUS
  Filled 2019-03-07 (×6): qty 1

## 2019-03-07 MED ORDER — AZITHROMYCIN 500 MG PO TABS
500.0000 mg | ORAL_TABLET | Freq: Every day | ORAL | Status: AC
  Administered 2019-03-07: 500 mg via ORAL
  Filled 2019-03-07: qty 2
  Filled 2019-03-07: qty 1

## 2019-03-07 MED ORDER — GUAIFENESIN-DM 100-10 MG/5ML PO SYRP
15.0000 mL | ORAL_SOLUTION | ORAL | Status: DC | PRN
  Administered 2019-03-07 – 2019-03-09 (×5): 15 mL via ORAL
  Filled 2019-03-07 (×6): qty 15

## 2019-03-07 MED ORDER — ISOSORBIDE MONONITRATE ER 60 MG PO TB24
60.0000 mg | ORAL_TABLET | Freq: Every day | ORAL | Status: DC
  Administered 2019-03-07 – 2019-03-09 (×3): 60 mg via ORAL
  Filled 2019-03-07 (×3): qty 1

## 2019-03-07 MED ORDER — AZITHROMYCIN 500 MG PO TABS
250.0000 mg | ORAL_TABLET | Freq: Every day | ORAL | Status: DC
  Administered 2019-03-08 – 2019-03-09 (×2): 250 mg via ORAL
  Filled 2019-03-07: qty 0.5
  Filled 2019-03-07 (×2): qty 1
  Filled 2019-03-07: qty 0.5

## 2019-03-07 MED ORDER — IPRATROPIUM-ALBUTEROL 0.5-2.5 (3) MG/3ML IN SOLN
3.0000 mL | Freq: Four times a day (QID) | RESPIRATORY_TRACT | Status: DC
  Administered 2019-03-07 – 2019-03-09 (×7): 3 mL via RESPIRATORY_TRACT
  Filled 2019-03-07 (×8): qty 3

## 2019-03-07 MED ORDER — BUDESONIDE 0.5 MG/2ML IN SUSP
0.5000 mg | Freq: Two times a day (BID) | RESPIRATORY_TRACT | Status: DC
  Administered 2019-03-07 – 2019-03-09 (×5): 0.5 mg via RESPIRATORY_TRACT
  Filled 2019-03-07 (×6): qty 2

## 2019-03-07 NOTE — ED Notes (Signed)
ED TO INPATIENT HANDOFF REPORT  ED Nurse Name and Phone #: Willene Hatchet Name/Age/Gender Candace [name] 68 y.o. female Room/Bed: ED37A/ED37A  Code Status   Code Status: Full Code  Home/SNF/Other Home Patient oriented to: self, place, time and situation Is this baseline? Yes   Triage Complete: Triage complete  Chief Complaint Chest Pain  Triage Note Pt arrived via EMS from UC with c/o cough, SOB and lightheadedness x1 week. Pt has EKG changes and was given 1 nitro spray as well as Asprin in route. Pt 6/10 on chest pain on arrival to ED. Pt tested at [facility] clinic on Friday with negative COVID. MD at bedside.    Allergies Allergies  Allergen Reactions  . Lisinopril Cough  . Penicillins Swelling, Rash and Other (See Comments)    Did it involve swelling of the face/tongue/throat, SOB, or low BP? Yes Did it involve sudden or severe rash/hives, skin peeling, or any reaction on the inside of your mouth or nose? Yes Did you need to seek medical attention at a hospital or doctor's office? Yes When did it last happen?15 years If all above answers are "NO", may proceed with cephalosporin use.     Level of Care/Admitting Diagnosis ED Disposition    ED Disposition Condition [facility] Area: [place] [[id number]]  Level of Care: Telemetry [5]  Covid Evaluation: Confirmed COVID Negative  Diagnosis: Chest pain [id number]  Admitting Physician: Christel Mormon [id number]  Attending Physician: Christel Mormon [id number]  PT Class (Do Not Modify): Observation [104]  PT Acc Code (Do Not Modify): Observation [10022]       B Medical/Surgery History Past Medical History:  Diagnosis Date  . Allergy   . Anemia   . Anxiety   . Arrhythmia   . Arthritis   . Coronary artery disease   . Depression   . Diabetes mellitus without complication ([place])   . Dyspnea    doe  . Dysrhythmia   . GERD (gastroesophageal reflux disease)   . Headache   . History  of hiatal hernia   . Hyperlipidemia   . Hypertension   . Myocardial infarction ([place])    2016, 04/2017  . Myocardial infarction with cardiac rehabilitation [facility])    MI 2016/ CABG 8/17    FINISHED CARDIAC REHAB 3 WEEKS AGO  . Panic attack   . Pneumonia   . Reflux   . Stroke [facility]) 2015   showed up on MRI; no weakness noted  . TIA (transient ischemic attack)   . Voice tremor    Past Surgical History:  Procedure Laterality Date  . ABDOMINAL HYSTERECTOMY    . APPENDECTOMY  1975  . ARTERY BIOPSY Right 04/26/2016   Procedure: BIOPSY TEMPORAL ARTERY;  Surgeon: Margaretha Sheffield, MD;  Location: [facility] ORS;  Service: ENT;  Laterality: Right;  . CARDIAC CATHETERIZATION N/A 11/06/2015   Procedure: Left Heart Cath and Coronary Angiography;  Surgeon: Corey Skains, MD;  Location: [place] CV LAB;  Service: Cardiovascular;  Laterality: N/A;  . CESAREAN SECTION    . COLONOSCOPY  2015  . COLONOSCOPY WITH PROPOFOL N/A 11/03/2018   Procedure: COLONOSCOPY WITH PROPOFOL;  Surgeon: Virgel Manifold, MD;  Location: [facility] ENDOSCOPY;  Service: Endoscopy;  Laterality: N/A;  . CORONARY ANGIOPLASTY  04/2017   [place]  . CORONARY ARTERY BYPASS GRAFT N/A 11/10/2015   Procedure: CORONARY ARTERY BYPASS GRAFTING (CABG), ON PUMP, TIMES FOUR, USING LEFT INTERNAL MAMMARY ARTERY, BILATERAL  GREATER SAPHENOUS VEINS HARVESTED ENDOSCOPICALLY;  Surgeon: [name], MD;  Location: [place];  Service: Open Heart Surgery;  Laterality: N/A;  LIMA-LAD; SEQ SVG-OM1-OM2; SVG-PL  . CORONARY STENT INTERVENTION N/A 08/05/2016   Procedure: Coronary Stent Intervention;  Surgeon: Isaias Cowman, MD;  Location: [place] CV LAB;  Service: Cardiovascular;  Laterality: N/A;  . ESOPHAGOGASTRODUODENOSCOPY (EGD) WITH PROPOFOL N/A 11/03/2018   Procedure: ESOPHAGOGASTRODUODENOSCOPY (EGD) WITH PROPOFOL;  Surgeon: Virgel Manifold, MD;  Location: [facility] ENDOSCOPY;  Service: Endoscopy;  Laterality: N/A;  . HYSTERECTOMY  ABDOMINAL WITH SALPINGO-OOPHORECTOMY Bilateral 08/15/2017   Procedure: HYSTERECTOMY ABDOMINAL WITH BILATERAL SALPINGO-OOPHORECTOMY;  Surgeon: Rubie Maid, MD;  Location: [facility] ORS;  Service: Gynecology;  Laterality: Bilateral;  . LEFT HEART CATH AND CORONARY ANGIOGRAPHY N/A 08/05/2016   Procedure: Left Heart Cath and Coronary Angiography;  Surgeon: Isaias Cowman, MD;  Location: [place] CV LAB;  Service: Cardiovascular;  Laterality: N/A;  . LEFT HEART CATH AND CORS/GRAFTS ANGIOGRAPHY N/A 09/19/2018   Procedure: LEFT HEART CATH AND CORS/GRAFTS ANGIOGRAPHY;  Surgeon: Corey Skains, MD;  Location: [place] CV LAB;  Service: Cardiovascular;  Laterality: N/A;  . OOPHORECTOMY    . TEE WITHOUT CARDIOVERSION N/A 11/10/2015   Procedure: TRANSESOPHAGEAL ECHOCARDIOGRAM (TEE);  Surgeon: [name], MD;  Location: [place];  Service: Open Heart Surgery;  Laterality: N/A;  . TUBAL LIGATION       A IV Location/Drains/Wounds Patient Lines/Drains/Airways Status   Active Line/Drains/Airways    Name:   Placement date:   Placement time:   Site:   Days:   Peripheral IV 03/06/19 Right Hand   03/06/19    1940    Hand   1   Sheath 09/19/18 Right Arterial;Femoral   09/19/18    1259    Arterial;Femoral   169   Pain Pump Midline Abdomen   08/15/17    1630     569   Incision (Closed) 08/15/17 Abdomen Other (Comment)   08/15/17    1601     569   Incision (Closed) 08/15/17 Vagina Other (Comment)   08/15/17    1601     569   Incision (Closed) 08/15/17 Abdomen   08/15/17    1727     569          Intake/Output Last 24 hours No intake or output data in the 24 hours ending 03/07/19 0008  Labs/Imaging Results for orders placed or performed during the hospital encounter of 03/06/19 (from the past 48 hour(s))  Basic metabolic panel     Status: Abnormal   Collection Time: 03/06/19  7:25 PM  Result Value Ref Range   Sodium 136 135 - 145 mmol/L   Potassium 3.6 3.5 - 5.1 mmol/L   Chloride 102 98 -  111 mmol/L   CO2 23 22 - 32 mmol/L   Glucose, Bld 145 (H) 70 - 99 mg/dL   BUN 8 8 - 23 mg/dL   Creatinine, Ser 0.66 0.44 - 1.00 mg/dL   Calcium 9.0 8.9 - 10.3 mg/dL   GFR calc non Af Amer >60 >60 mL/min   GFR calc Af Amer >60 >60 mL/min   Anion gap 11 5 - 15    Comment: Performed at [facility], [address] [place], [place] [address]  CBC     Status: None   Collection Time: 03/06/19  7:25 PM  Result Value Ref Range   WBC 6.5 4.0 - 10.5 K/uL   RBC 4.30 3.87 - 5.11 MIL/uL  Hemoglobin 13.6 12.0 - 15.0 g/dL   HCT 40.2 36.0 - 46.0 %   MCV 93.5 80.0 - 100.0 fL   MCH 31.6 26.0 - 34.0 pg   MCHC 33.8 30.0 - 36.0 g/dL   RDW 11.9 11.5 - 15.5 %   Platelets 229 150 - 400 K/uL   nRBC 0.0 0.0 - 0.2 %    Comment: Performed at [facility], [address] [place], [place] [address]  Troponin I (High Sensitivity)     Status: None   Collection Time: 03/06/19  7:25 PM  Result Value Ref Range   Troponin I (High Sensitivity) 9 <18 ng/L    Comment: (NOTE) Elevated high sensitivity troponin I (hsTnI) values and significant  changes across serial measurements may suggest ACS but many other  chronic and acute conditions are known to elevate hsTnI results.  Refer to the "Links" section for chest pain algorithms and additional  guidance. Performed at [facility], [address] [place], [place] [address]   Respiratory Panel by RT PCR (Flu A&B, Covid) - Nasopharyngeal Swab     Status: None   Collection Time: 03/06/19  7:25 PM   Specimen: Nasopharyngeal Swab  Result Value Ref Range   SARS Coronavirus 2 by RT PCR NEGATIVE NEGATIVE    Comment: (NOTE) SARS-CoV-2 target nucleic acids are NOT DETECTED. The SARS-CoV-2 RNA is generally detectable in upper respiratoy specimens during the acute phase of infection. The lowest concentration of SARS-CoV-2 viral copies this assay can detect is 131 copies/mL. A negative result does not preclude SARS-Cov-2 infection and  should not be used as the sole basis for treatment or other patient management decisions. A negative result may occur with  improper specimen collection/handling, submission of specimen other than nasopharyngeal swab, presence of viral mutation(s) within the areas targeted by this assay, and inadequate number of viral copies (<131 copies/mL). A negative result must be combined with clinical observations, patient history, and epidemiological information. The expected result is Negative. Fact Sheet for Patients:  PinkCheek.be Fact Sheet for Healthcare Providers:  GravelBags.it This test is not yet ap proved or cleared by the [place] FDA and  has been authorized for detection and/or diagnosis of SARS-CoV-2 by FDA under an Emergency Use Authorization (EUA). This EUA will remain  in effect (meaning this test can be used) for the duration of the COVID-19 declaration under Section 564(b)(1) of the Act, 21 U.S.C. section 360bbb-3(b)(1), unless the authorization is terminated or revoked sooner.    Influenza A by PCR NEGATIVE NEGATIVE   Influenza B by PCR NEGATIVE NEGATIVE    Comment: (NOTE) The Xpert Xpress SARS-CoV-2/FLU/RSV assay is intended as an aid in  the diagnosis of influenza from Nasopharyngeal swab specimens and  should not be used as a sole basis for treatment. Nasal washings and  aspirates are unacceptable for Xpert Xpress SARS-CoV-2/FLU/RSV  testing. Fact Sheet for Patients: PinkCheek.be Fact Sheet for Healthcare Providers: GravelBags.it This test is not yet approved or cleared by the [place] FDA and  has been authorized for detection and/or diagnosis of SARS-CoV-2 by  FDA under an Emergency Use Authorization (EUA). This EUA will remain  in effect (meaning this test can be used) for the duration of the  Covid-19 declaration under Section 564(b)(1) of  the Act, 21  U.S.C. section 360bbb-3(b)(1), unless the authorization is  terminated or revoked. Performed at [facility], [address] [place], [place] [address]   Troponin I (High Sensitivity)  Status: None   Collection Time: 03/06/19  9:57 PM  Result Value Ref Range   Troponin I (High Sensitivity) 8 <18 ng/L    Comment: (NOTE) Elevated high sensitivity troponin I (hsTnI) values and significant  changes across serial measurements may suggest ACS but many other  chronic and acute conditions are known to elevate hsTnI results.  Refer to the "Links" section for chest pain algorithms and additional  guidance. Performed at [facility], [address] [place], [place] [address]    Dg Chest 2 View  Result Date: 03/06/2019 CLINICAL DATA:  Productive cough, shortness of breath. EXAM: CHEST - 2 VIEW COMPARISON:  December 07, 2018. FINDINGS: The heart size and mediastinal contours are within normal limits. Both lungs are clear. Status post coronary bypass graft. No pneumothorax or pleural effusion is noted. The visualized skeletal structures are unremarkable. IMPRESSION: No active cardiopulmonary disease. Electronically Signed   By: Marijo Conception M.D.   On: 03/06/2019 17:55   Dg Chest Port 1 View  Result Date: 03/06/2019 CLINICAL DATA:  Chest pain and cough. EXAM: PORTABLE CHEST 1 VIEW COMPARISON:  03/06/2019 and prior radiographs FINDINGS: The cardiomediastinal silhouette is unremarkable. CABG changes again noted. There is no evidence of focal airspace disease, pulmonary edema, suspicious pulmonary nodule/mass, pleural effusion, or pneumothorax. No acute bony abnormalities are identified. Degenerative changes in both shoulders again identified. IMPRESSION: No evidence of active cardiopulmonary disease. Electronically Signed   By: Margarette Canada M.D.   On: 03/06/2019 19:46    Pending Labs Unresulted Labs (From admission, onward)    Start     Ordered   03/06/19 2238   HIV Antibody (routine testing w rflx)  (HIV Antibody (Routine testing w reflex) panel)  Once,   STAT     03/06/19 2242          Vitals/Pain Today's Vitals   03/06/19 2214 03/06/19 2237 03/06/19 2241 03/06/19 2327  BP:   (!) 196/80 (!) 182/82  Pulse:   93 75  Resp:   18   Temp:      TempSrc:      SpO2:   95%   PainSc: 7  8       Isolation Precautions No active isolations  Medications Medications  aspirin chewable tablet 81 mg (has no administration in time range)  atorvastatin (LIPITOR) tablet 80 mg (has no administration in time range)  furosemide (LASIX) tablet 20 mg (has no administration in time range)  isosorbide mononitrate (IMDUR) 24 hr tablet 30 mg (has no administration in time range)  metoprolol tartrate (LOPRESSOR) tablet 100 mg (100 mg Oral Given 03/06/19 2327)  nitroGLYCERIN (NITROSTAT) SL tablet 0.4 mg (has no administration in time range)  Semaglutide(0.25 or 0.5MG /DOS) SOPN 0.25 mg ( Subcutaneous Canceled Entry 03/06/19 2247)  famotidine (PEPCID) tablet 40 mg (has no administration in time range)  ondansetron (ZOFRAN-ODT) disintegrating tablet 8 mg (has no administration in time range)  pantoprazole (PROTONIX) EC tablet 20 mg (20 mg Oral Not Given 03/07/19 0001)  acetaminophen (TYLENOL) tablet 650 mg (has no administration in time range)  ondansetron (ZOFRAN) injection 4 mg (has no administration in time range)  enoxaparin (LOVENOX) injection 40 mg (40 mg Subcutaneous Given 03/06/19 2328)  0.9 %  sodium chloride infusion ( Intravenous New Bag/Given 03/06/19 2335)  zolpidem (AMBIEN) tablet 5 mg (5 mg Oral Given 03/06/19 2328)  chlorpheniramine-HYDROcodone (TUSSIONEX) 10-8 MG/5ML suspension 5 mL (5 mLs Oral Given 03/06/19 2039)  morphine 4 MG/ML injection 4 mg (4 mg  Intravenous Given 03/06/19 2238)  alum & mag hydroxide-simeth (MAALOX/MYLANTA) 200-200-20 MG/5ML suspension 30 mL (30 mLs Oral Given 03/06/19 2328)    And  lidocaine (XYLOCAINE) 2 % viscous mouth solution 15  mL (15 mLs Oral Given 03/06/19 2328)    Mobility walks with person assist Low fall risk   Focused Assessments Cardiac Assessment Handoff:    Lab Results  Component Value Date   CKTOTAL 91 08/01/2012   CKMB 5.9 (H) 07/14/2013   TROPONINI <0.03 09/14/2018   No results found for: DDIMER Does the Patient currently have chest pain? Yes     R Recommendations: See Admitting Provider Note  Report given to:   Additional Notes:

## 2019-03-07 NOTE — ED Notes (Signed)
[place] called for pt- will have pt call back

## 2019-03-07 NOTE — Progress Notes (Signed)
*  PRELIMINARY RESULTS* Echocardiogram 2D Echocardiogram has been performed.  Candace [name] 03/07/2019, 10:42 AM

## 2019-03-07 NOTE — ED Notes (Signed)
ECHO at bedside.

## 2019-03-07 NOTE — Consult Note (Signed)
[facility] Card[facility]  CARDIOLOGY CONSULT NOTE  Patient ID: Candace [name] MRN: [id number] DOB/AGE: 1950-07-20 68 y.o.  Admit date: 03/06/2019 Referring Physician Leslye Peer Primary Physician Rosanna Randy Primary Cardiologist Nehemiah Massed Reason for Consultation chest pain  HPI: 68 year old female with known coronary artery disease, referred for chest pain.  She presents to [facility] ED on 03/06/2019 with 7 out of 10 substernal chest pain with nausea and vomiting.  ECG reveals sinus rhythm with incomplete left bundle branch block which is unchanged.  Patient labs notable for normal troponin (7, 9, 8).  This morning, patient reports chest pain has improved, is unlike compared to prior angina.  Patient has known coronary disease, status post CABG 2017, and PTCA 04/2017.  She underwent recent cardiac catheterization 09/19/2018 which revealed 75% stenosis mid LAD, 80% stenosis ostial left circumflex, 75% stenosis ostial OM1, with patent LIMA to LAD and patent SVG to PDA and OM 2.  Patient was treated medically.  According to the patient, she is undergone prior scan Myoview but experienced a reaction and the study had to be discontinued.  Review of systems complete and found to be negative unless listed above     Past Medical History:  Diagnosis Date  . Allergy   . Anemia   . Anxiety   . Arrhythmia   . Arthritis   . Coronary artery disease   . Depression   . Diabetes mellitus without complication ([place])   . Dyspnea    doe  . Dysrhythmia   . GERD (gastroesophageal reflux disease)   . Headache   . History of hiatal hernia   . Hyperlipidemia   . Hypertension   . Myocardial infarction ([place])    2016, 04/2017  . Myocardial infarction with cardiac rehabilitation [facility])    MI 2016/ CABG 8/17    FINISHED CARDIAC REHAB 3 WEEKS AGO  . Panic attack   . Pneumonia   . Reflux   . Stroke [facility]) 2015   showed up on MRI; no weakness noted  . TIA (transient ischemic attack)   . Voice tremor     Past Surgical History:   Procedure Laterality Date  . ABDOMINAL HYSTERECTOMY    . APPENDECTOMY  1975  . ARTERY BIOPSY Right 04/26/2016   Procedure: BIOPSY TEMPORAL ARTERY;  Surgeon: Margaretha Sheffield, MD;  Location: [facility] ORS;  Service: ENT;  Laterality: Right;  . CARDIAC CATHETERIZATION N/A 11/06/2015   Procedure: Left Heart Cath and Coronary Angiography;  Surgeon: Corey Skains, MD;  Location: [place] CV LAB;  Service: Cardiovascular;  Laterality: N/A;  . CESAREAN SECTION    . COLONOSCOPY  2015  . COLONOSCOPY WITH PROPOFOL N/A 11/03/2018   Procedure: COLONOSCOPY WITH PROPOFOL;  Surgeon: Virgel Manifold, MD;  Location: [facility] ENDOSCOPY;  Service: Endoscopy;  Laterality: N/A;  . CORONARY ANGIOPLASTY  04/2017   [place]  . CORONARY ARTERY BYPASS GRAFT N/A 11/10/2015   Procedure: CORONARY ARTERY BYPASS GRAFTING (CABG), ON PUMP, TIMES FOUR, USING LEFT INTERNAL MAMMARY ARTERY, BILATERAL GREATER SAPHENOUS VEINS HARVESTED ENDOSCOPICALLY;  Surgeon: [name], MD;  Location: [place];  Service: Open Heart Surgery;  Laterality: N/A;  LIMA-LAD; SEQ SVG-OM1-OM2; SVG-PL  . CORONARY STENT INTERVENTION N/A 08/05/2016   Procedure: Coronary Stent Intervention;  Surgeon: Isaias Cowman, MD;  Location: [place] CV LAB;  Service: Cardiovascular;  Laterality: N/A;  . ESOPHAGOGASTRODUODENOSCOPY (EGD) WITH PROPOFOL N/A 11/03/2018   Procedure: ESOPHAGOGASTRODUODENOSCOPY (EGD) WITH PROPOFOL;  Surgeon: Virgel Manifold, MD;  Location: [facility] ENDOS[facility];  Service: Endoscopy;  Laterality: N/A;  . HYSTERECTOMY ABDOMINAL WITH SALPINGO-OOPHORECTOMY Bilateral 08/15/2017   Procedure: HYSTERECTOMY ABDOMINAL WITH BILATERAL SALPINGO-OOPHORECTOMY;  Surgeon: Rubie Maid, MD;  Location: [facility] ORS;  Service: Gynecology;  Laterality: Bilateral;  . LEFT HEART CATH AND CORONARY ANGIOGRAPHY N/A 08/05/2016   Procedure: Left Heart Cath and Coronary Angiography;  Surgeon: Isaias Cowman, MD;  Location: [place] CV LAB;   Service: Cardiovascular;  Laterality: N/A;  . LEFT HEART CATH AND CORS/GRAFTS ANGIOGRAPHY N/A 09/19/2018   Procedure: LEFT HEART CATH AND CORS/GRAFTS ANGIOGRAPHY;  Surgeon: Corey Skains, MD;  Location: [place] CV LAB;  Service: Cardiovascular;  Laterality: N/A;  . OOPHORECTOMY    . TEE WITHOUT CARDIOVERSION N/A 11/10/2015   Procedure: TRANSESOPHAGEAL ECHOCARDIOGRAM (TEE);  Surgeon: [name], MD;  Location: [place];  Service: Open Heart Surgery;  Laterality: N/A;  . TUBAL LIGATION      (Not in a hospital admission)  Social History   Socioeconomic History  . Marital status: Divorced    Spouse name: Not on file  . Number of children: 3  . Years of education: Not on file  . Highest education level: Some college, no degree  Occupational History  . Occupation: retired  Scientific laboratory technician  . Financial resource strain: Not hard at all  . Food insecurity    Worry: Never true    Inability: Never true  . Transportation needs    Medical: No    Non-medical: No  Tobacco Use  . Smoking status: Former Smoker    Packs/day: 0.50    Types: Cigarettes    Quit date: 10/07/2001    Years since quitting: 17.4  . Smokeless tobacco: Never Used  Substance and Sexual Activity  . Alcohol use: No    Alcohol/week: 0.0 standard drinks  . Drug use: No  . Sexual activity: Not Currently  Lifestyle  . Physical activity    Days per week: 0 days    Minutes per session: 0 min  . Stress: Not at all  Relationships  . Social Herbalist on phone: Patient refused    Gets together: Patient refused    Attends religious service: Patient refused    Active member of club or organization: Patient refused    Attends meetings of clubs or organizations: Patient refused    Relationship status: Patient refused  . Intimate partner violence    Fear of current or ex partner: Patient refused    Emotionally abused: Patient refused    Physically abused: Patient refused    Forced sexual activity:  Patient refused  Other Topics Concern  . Not on file  Social History Narrative  . Not on file    Family History  Problem Relation Age of Onset  . Cancer Father   . Hypertension Father   . Heart disease Father   . Asthma Father   . Cancer Mother   . Hypertension Mother   . Pancreatic cancer Mother 78  . Cancer Sister   . Breast cancer Sister 50  . Breast cancer Sister 55  . Lung cancer Brother   . Pancreatic cancer Sister 78  . Cancer Sister       Review of systems complete and found to be negative unless listed above      PHYSICAL EXAM  General: Well developed, well nourished, in no acute distress HEENT:  Normocephalic and atramatic Neck:  No JVD.  Lungs: Clear bilaterally to auscultation and percussion. Heart: HRRR . Normal S1 and S2 without gallops  or murmurs.  Abdomen: Bowel sounds are positive, abdomen soft and non-tender  Msk:  Back normal, normal gait. Normal strength and tone for age. Extremities: No clubbing, cyanosis or edema.   Neuro: Alert and oriented X 3. Psych:  Good affect, responds appropriately  Labs:   Lab Results  Component Value Date   WBC 6.5 03/06/2019   HGB 13.6 03/06/2019   HCT 40.2 03/06/2019   MCV 93.5 03/06/2019   PLT 229 03/06/2019    Recent Labs  Lab 03/06/19 1925  NA 136  K 3.6  CL 102  CO2 23  BUN 8  CREATININE 0.66  CALCIUM 9.0  GLUCOSE 145*   Lab Results  Component Value Date   CKTOTAL 91 08/01/2012   CKMB 5.9 (H) 07/14/2013   TROPONINI <0.03 09/14/2018    Lab Results  Component Value Date   CHOL 214 (H) 01/03/2019   CHOL 167 01/12/2018   CHOL 259 (H) 10/01/2016   Lab Results  Component Value Date   HDL 53 01/03/2019   HDL 51 01/12/2018   HDL 66 10/01/2016   Lab Results  Component Value Date   LDLCALC 147 (H) 01/03/2019   LDLCALC 87 01/12/2018   LDLCALC 173 (H) 10/01/2016   Lab Results  Component Value Date   TRIG 77 01/03/2019   TRIG 145 01/12/2018   TRIG 100 10/01/2016   Lab Results   Component Value Date   CHOLHDL 4.0 01/03/2019   CHOLHDL 3.9 10/01/2016   No results found for: LDLDIRECT    Radiology: Dg Chest 2 View  Result Date: 03/06/2019 CLINICAL DATA:  Productive cough, shortness of breath. EXAM: CHEST - 2 VIEW COMPARISON:  December 07, 2018. FINDINGS: The heart size and mediastinal contours are within normal limits. Both lungs are clear. Status post coronary bypass graft. No pneumothorax or pleural effusion is noted. The visualized skeletal structures are unremarkable. IMPRESSION: No active cardiopulmonary disease. Electronically Signed   By: Marijo Conception M.D.   On: 03/06/2019 17:55   Dg Chest Port 1 View  Result Date: 03/06/2019 CLINICAL DATA:  Chest pain and cough. EXAM: PORTABLE CHEST 1 VIEW COMPARISON:  03/06/2019 and prior radiographs FINDINGS: The cardiomediastinal silhouette is unremarkable. CABG changes again noted. There is no evidence of focal airspace disease, pulmonary edema, suspicious pulmonary nodule/mass, pleural effusion, or pneumothorax. No acute bony abnormalities are identified. Degenerative changes in both shoulders again identified. IMPRESSION: No evidence of active cardiopulmonary disease. Electronically Signed   By: Margarette Canada M.D.   On: 03/06/2019 19:46    EKG: Sinus rhythm with incomplete left bundle branch block  ASSESSMENT AND PLAN:   1.  Chest pain, with typical and atypical features, nondiagnostic ECG, normal troponin, chest pain improved.  Patient reports adverse reaction during [facility]. 2.  CAD, status post CABG x3, with recent cardiac catheterization revealing patent grafts  Recommendations  1.  Agree with current therapy 2.  Defer full dose anticoagulation at this time 3.  Continue home medications including isosorbide mononitrate and metoprolol tartrate 4.  Uptitrate isosorbide mononitrate to 60 mg daily 5.  Defer Lexiscan Myoview since patient has history of adverse reaction  Signed: Isaias Cowman  MD,PhD, [facility] 03/07/2019, 8:21 AM

## 2019-03-07 NOTE — Progress Notes (Signed)
Patient ID: Candace [name], female   DOB: [birth year], 68 y.o.   MRN: [id number] [facility] PROGRESS NOTE  Candace [name] WUJ:[id number] DOB: February 23, 1951 DOA: 03/06/2019 PCP: [name]., MD  HPI/Subjective: Patient coming in with chest pain weakness body aches headache lightheadedness sneezing and coughing.  Patient's Covid and flu test were negative.  Patient was seen by cardiology and no further testing recommended.  Patient was still not feeling well when I saw her.  Objective: Vitals:   03/07/19 1200 03/07/19 1610  BP: 125/87 137/89  Pulse: 66 83  Resp: 18 17  Temp:    SpO2: 97% 96%   No intake or output data in the 24 hours ending 03/07/19 1623 There were no vitals filed for this visit.  ROS: Review of Systems  Constitutional: Positive for malaise/fatigue. Negative for chills and fever.  Eyes: Negative for blurred vision.  Respiratory: Positive for cough and shortness of breath.   Cardiovascular: Negative for chest pain.  Gastrointestinal: Negative for abdominal pain, constipation, diarrhea, nausea and vomiting.  Genitourinary: Negative for dysuria.  Musculoskeletal: Positive for myalgias. Negative for joint pain.  Neurological: Negative for dizziness and headaches.   Exam: Physical Exam  Constitutional: She is oriented to person, place, and time.  HENT:  Nose: No mucosal edema.  Mouth/Throat: No oropharyngeal exudate or posterior oropharyngeal edema.  Eyes: Pupils are equal, round, and reactive to light. Conjunctivae, EOM and lids are normal.  Neck: No JVD present. Carotid bruit is not present. No edema present. No thyroid mass and no thyromegaly present.  Cardiovascular: S1 normal and S2 normal. Exam reveals no gallop.  No murmur heard. Pulses:      Dorsalis pedis pulses are 2+ on the right side and 2+ on the left side.  Respiratory: No respiratory distress. She has decreased breath sounds in the right middle field and the right lower field. She has  wheezes in the right middle field and the right lower field. She has no rhonchi. She has no rales.  GI: Soft. Bowel sounds are normal. There is no abdominal tenderness.  Musculoskeletal:     Right ankle: She exhibits no swelling.     Left ankle: She exhibits no swelling.  Lymphadenopathy:    She has no cervical adenopathy.  Neurological: She is alert and oriented to person, place, and time. No cranial nerve deficit.  Skin: Skin is warm. No rash noted. Nails show no clubbing.  Psychiatric: She has a normal mood and affect.      Data Reviewed: Basic Metabolic Panel: Recent Labs  Lab 03/06/19 1813 03/06/19 1925  NA 136 136  K 3.9 3.6  CL 103 102  CO2 23 23  GLUCOSE 144* 145*  BUN 8 8  CREATININE 0.61 0.66  CALCIUM 9.2 9.0   Liver Function Tests: No results for input(s): AST, ALT, ALKPHOS, BILITOT, PROT, ALBUMIN in the last 168 hours. No results for input(s): LIPASE, AMYLASE in the last 168 hours. No results for input(s): AMMONIA in the last 168 hours. CBC: Recent Labs  Lab 03/06/19 1813 03/06/19 1925  WBC 6.5 6.5  NEUTROABS 2.5  --   HGB 14.0 13.6  HCT 40.8 40.2  MCV 92.1 93.5  PLT 221 229    CBG: Recent Labs  Lab 03/07/19 0753 03/07/19 1221  GLUCAP 104* 174*    Recent Results (from the past 240 hour(s))  Rapid Influenza A&B Antigens ([facility] only)     Status: None   Collection Time: 03/06/19  5:32 PM  Specimen: Flu Kit Nasopharyngeal Swab; Respiratory  Result Value Ref Range Status   Influenza A ([facility]) NEGATIVE NEGATIVE Final   Influenza B ([facility]) NEGATIVE NEGATIVE Final    Comment: Performed at [facility], [address] [place], [place] [address]  Respiratory Panel by RT PCR (Flu A&B, Covid) - Nasopharyngeal Swab     Status: None   Collection Time: 03/06/19  7:25 PM   Specimen: Nasopharyngeal Swab  Result Value Ref Range Status   SARS Coronavirus 2 by RT PCR NEGATIVE NEGATIVE Final    Comment: (NOTE) SARS-CoV-2 target nucleic acids are  NOT DETECTED. The SARS-CoV-2 RNA is generally detectable in upper respiratoy specimens during the acute phase of infection. The lowest concentration of SARS-CoV-2 viral copies this assay can detect is 131 copies/mL. A negative result does not preclude SARS-Cov-2 infection and should not be used as the sole basis for treatment or other patient management decisions. A negative result may occur with  improper specimen collection/handling, submission of specimen other than nasopharyngeal swab, presence of viral mutation(s) within the areas targeted by this assay, and inadequate number of viral copies (<131 copies/mL). A negative result must be combined with clinical observations, patient history, and epidemiological information. The expected result is Negative. Fact Sheet for Patients:  PinkCheek.be Fact Sheet for Healthcare Providers:  GravelBags.it This test is not yet ap proved or cleared by the [place] FDA and  has been authorized for detection and/or diagnosis of SARS-CoV-2 by FDA under an Emergency Use Authorization (EUA). This EUA will remain  in effect (meaning this test can be used) for the duration of the COVID-19 declaration under Section 564(b)(1) of the Act, 21 U.S.C. section 360bbb-3(b)(1), unless the authorization is terminated or revoked sooner.    Influenza A by PCR NEGATIVE NEGATIVE Final   Influenza B by PCR NEGATIVE NEGATIVE Final    Comment: (NOTE) The Xpert Xpress SARS-CoV-2/FLU/RSV assay is intended as an aid in  the diagnosis of influenza from Nasopharyngeal swab specimens and  should not be used as a sole basis for treatment. Nasal washings and  aspirates are unacceptable for Xpert Xpress SARS-CoV-2/FLU/RSV  testing. Fact Sheet for Patients: PinkCheek.be Fact Sheet for Healthcare Providers: GravelBags.it This test is not yet approved or  cleared by the [place] FDA and  has been authorized for detection and/or diagnosis of SARS-CoV-2 by  FDA under an Emergency Use Authorization (EUA). This EUA will remain  in effect (meaning this test can be used) for the duration of the  Covid-19 declaration under Section 564(b)(1) of the Act, 21  U.S.C. section 360bbb-3(b)(1), unless the authorization is  terminated or revoked. Performed at [facility], [address], [place], [place] [address]      Studies: Dg Chest 2 View  Result Date: 03/07/2019 CLINICAL DATA:  Cough and shortness of breath. Chest pain. EXAM: CHEST - 2 VIEW COMPARISON:  03/06/2019 and 12/07/2018 FINDINGS: The heart size and mediastinal contours are within normal limits. CABG. No effusions. Both lungs are clear. The visualized skeletal structures are unremarkable. IMPRESSION: No active cardiopulmonary disease. Electronically Signed   By: Lorriane Shire M.D.   On: 03/07/2019 09:38   Dg Chest 2 View  Result Date: 03/06/2019 CLINICAL DATA:  Productive cough, shortness of breath. EXAM: CHEST - 2 VIEW COMPARISON:  December 07, 2018. FINDINGS: The heart size and mediastinal contours are within normal limits. Both lungs are clear. Status post coronary bypass graft. No pneumothorax or pleural effusion is noted. The visualized skeletal  structures are unremarkable. IMPRESSION: No active cardiopulmonary disease. Electronically Signed   By: Marijo Conception M.D.   On: 03/06/2019 17:55   Dg Chest Port 1 View  Result Date: 03/06/2019 CLINICAL DATA:  Chest pain and cough. EXAM: PORTABLE CHEST 1 VIEW COMPARISON:  03/06/2019 and prior radiographs FINDINGS: The cardiomediastinal silhouette is unremarkable. CABG changes again noted. There is no evidence of focal airspace disease, pulmonary edema, suspicious pulmonary nodule/mass, pleural effusion, or pneumothorax. No acute bony abnormalities are identified. Degenerative changes in both shoulders again identified. IMPRESSION:  No evidence of active cardiopulmonary disease. Electronically Signed   By: Margarette Canada M.D.   On: 03/06/2019 19:46    Scheduled Meds: . aspirin  81 mg Oral Daily  . atorvastatin  80 mg Oral q1800  . azithromycin  500 mg Oral Daily   Followed by  . [START ON 03/08/2019] azithromycin  250 mg Oral Daily  . budesonide (PULMICORT) nebulizer solution  0.5 mg Nebulization BID  . enoxaparin (LOVENOX) injection  40 mg Subcutaneous Q24H  . famotidine  40 mg Oral Daily  . furosemide  20 mg Oral QODAY  . insulin aspart  0-9 Units Subcutaneous TID WC  . ipratropium-albuterol  3 mL Nebulization Q6H  . isosorbide mononitrate  60 mg Oral Daily  . methylPREDNISolone (SOLU-MEDROL) injection  40 mg Intravenous Daily  . metoprolol tartrate  100 mg Oral BID  . pantoprazole  20 mg Oral BID  . Semaglutide(0.25 or 0.5MG/DOS)  0.25 mg Subcutaneous Weekly   Continuous Infusions: . sodium chloride 50 mL/hr at 03/07/19 0933    Assessment/Plan:  1. Acute bronchitis with wheezing on the right side.  Repeat chest x-ray negative for pneumonia.  Start Solu-Medrol nebulizer treatments.  Empiric Zithromax. 2. Chest pain.  Cardiac enzymes negative.  Cardiology saw the patient and did not wish any further testing. 3. Paroxysmal atrial fibrillation on metoprolol.  Not on any anticoagulation. 4. Chronic diastolic congestive heart failure.  Continue oral Lasix and metoprolol. 5. Type 2 diabetes mellitus uncontrolled.  Patient on semaglutide and sliding scale insulin 6. Hyperlipidemia unspecified on atorvastatin  Code Status:     Code Status Orders  (From admission, onward)         Start     Ordered   03/06/19 2238  Full code  Continuous     03/06/19 2242        Code Status History    Date Active Date Inactive Code Status Order ID Comments User Context   09/19/2018 1352 09/19/2018 1830 Full Code [id number]  Corey Skains, MD Inpatient   03/27/2018 1632 03/28/2018 1840 Full Code [id number]  Dustin Flock,  MD Inpatient   08/15/2017 2104 08/18/2017 1715 Full Code [id number]  Rubie Maid, MD Inpatient   12/23/2016 1722 12/24/2016 1639 Full Code [id number]  Gladstone Lighter, MD Inpatient   08/03/2016 2306 08/06/2016 1247 Full Code [id number]  Lance Coon, MD ED   07/26/2016 1639 07/27/2016 1811 Full Code [id number]  Demetrios Loll, MD Inpatient   11/07/2015 1711 11/10/2015 1552 Full Code [id number]  John Giovanni, PA-C Inpatient   11/05/2015 2155 11/07/2015 1646 Full Code [id number]  Henreitta Leber, MD Inpatient   Advance Care Planning Activity     Disposition Plan: Reevaluate tomorrow  Consultants:  Cardiology  Antibiotics:  P.o. Zithromax  Time spent: 28 minutes  [place]

## 2019-03-07 NOTE — ED Notes (Signed)
Called lab to draw HIV blood tubes

## 2019-03-07 NOTE — ED Notes (Signed)
Report provided to Stephanie, RN

## 2019-03-07 NOTE — ED Notes (Signed)
Pt up to use bathroom 

## 2019-03-07 NOTE — ED Notes (Signed)
Lab at bedside to get blood

## 2019-03-08 ENCOUNTER — Inpatient Hospital Stay: Payer: Medicare Other

## 2019-03-08 ENCOUNTER — Telehealth: Payer: Self-pay

## 2019-03-08 DIAGNOSIS — R2681 Unsteadiness on feet: Secondary | ICD-10-CM

## 2019-03-08 LAB — GLUCOSE, CAPILLARY
Glucose-Capillary: 135 mg/dL — ABNORMAL HIGH (ref 70–99)
Glucose-Capillary: 188 mg/dL — ABNORMAL HIGH (ref 70–99)
Glucose-Capillary: 139 mg/dL — ABNORMAL HIGH (ref 70–99)
Glucose-Capillary: 201 mg/dL — ABNORMAL HIGH (ref 70–99)

## 2019-03-08 MED ORDER — MECLIZINE HCL 12.5 MG PO TABS
12.5000 mg | ORAL_TABLET | Freq: Three times a day (TID) | ORAL | Status: DC
  Administered 2019-03-08 – 2019-03-09 (×4): 12.5 mg via ORAL
  Filled 2019-03-08 (×6): qty 1

## 2019-03-08 MED ORDER — COMBIVENT RESPIMAT 20-100 MCG/ACT IN AERS: 1.0000 | INHALATION_SPRAY | Freq: Four times a day (QID) | RESPIRATORY_TRACT | 0 refills | Status: DC | PRN

## 2019-03-08 MED ORDER — AZITHROMYCIN 250 MG PO TABS: 250.0000 mg | ORAL_TABLET | Freq: Every day | ORAL | 0 refills | Status: AC

## 2019-03-08 MED ORDER — PREDNISONE 10 MG PO TABS: ORAL_TABLET | ORAL | 0 refills | Status: AC

## 2019-03-08 MED ORDER — ISOSORBIDE MONONITRATE ER 60 MG PO TB24: 60.0000 mg | ORAL_TABLET | Freq: Every day | ORAL | 0 refills | Status: DC

## 2019-03-08 NOTE — Telephone Encounter (Signed)
Was able to speak with her daughter Dava and informed her of her mothers appointment. She gave verbal understanding.

## 2019-03-08 NOTE — Progress Notes (Signed)
Patient ID: Candace [name], female   DOB: 1950/05/12, 68 y.o.   MRN: [id number] [facility] PROGRESS NOTE  Candace [name]:[id number] DOB: [birth year] DOA: 03/06/2019 PCP: [name]., MD  HPI/Subjective: Patient was feeling better today than when she came in.  Still has a little bit of a sore throat.  Breathing is better and cough is less.  Still feels tired and dizzy.  Objective: Vitals:   03/08/19 1037 03/08/19 1040  BP: 135/76 132/66  Pulse: 75 74  Resp:    Temp:    SpO2: 95% 96%    Intake/Output Summary (Last 24 hours) at 03/08/2019 1312 Last data filed at 03/08/2019 1237 Gross per 24 hour  Intake 1558.72 ml  Output 1050 ml  Net 508.72 ml   Filed Weights   03/07/19 1645 03/08/19 0307  Weight: 77.6 kg 77.3 kg    ROS: Review of Systems  Constitutional: Positive for malaise/fatigue. Negative for chills and fever.  HENT: Positive for sore throat.   Eyes: Negative for blurred vision.  Respiratory: Positive for cough and shortness of breath.   Cardiovascular: Negative for chest pain.  Gastrointestinal: Negative for abdominal pain, constipation, diarrhea, nausea and vomiting.  Genitourinary: Negative for dysuria.  Musculoskeletal: Positive for myalgias. Negative for joint pain.  Neurological: Positive for dizziness. Negative for headaches.   Exam: Physical Exam  Constitutional: She is oriented to person, place, and time.  HENT:  Nose: No mucosal edema.  Mouth/Throat: No oropharyngeal exudate or posterior oropharyngeal edema.  Eyes: Pupils are equal, round, and reactive to light. Conjunctivae, EOM and lids are normal.  Neck: No JVD present. Carotid bruit is not present. No thyroid mass and no thyromegaly present.  Cardiovascular: S1 normal and S2 normal. Exam reveals no gallop.  No murmur heard. Pulses:      Dorsalis pedis pulses are 2+ on the right side and 2+ on the left side.  Respiratory: No respiratory distress. She has decreased breath sounds  in the right middle field and the right lower field. She has wheezes in the right middle field and the right lower field. She has no rhonchi. She has no rales.  GI: Soft. Bowel sounds are normal. There is no abdominal tenderness.  Musculoskeletal:     Cervical back: No edema.     Right ankle: No swelling.     Left ankle: No swelling.  Lymphadenopathy:    She has no cervical adenopathy.  Neurological: She is alert and oriented to person, place, and time. No cranial nerve deficit.  Skin: Skin is warm. No rash noted. Nails show no clubbing.  Psychiatric: She has a normal mood and affect.      Data Reviewed: Basic Metabolic Panel: Recent Labs  Lab 03/06/19 1813 03/06/19 1925  NA 136 136  K 3.9 3.6  CL 103 102  CO2 23 23  GLUCOSE 144* 145*  BUN 8 8  CREATININE 0.61 0.66  CALCIUM 9.2 9.0   CBC: Recent Labs  Lab 03/06/19 1813 03/06/19 1925  WBC 6.5 6.5  NEUTROABS 2.5  --   HGB 14.0 13.6  HCT 40.8 40.2  MCV 92.1 93.5  PLT 221 229    CBG: Recent Labs  Lab 03/07/19 1221 03/07/19 1714 03/07/19 2118 03/08/19 0722 03/08/19 1107  GLUCAP 174* 256* 263* 135* 188*    Recent Results (from the past 240 hour(s))  Rapid Influenza A&B Antigens ([facility] only)     Status: None   Collection Time: 03/06/19  5:32 PM  Specimen: Flu Kit Nasopharyngeal Swab; Respiratory  Result Value Ref Range Status   Influenza A ([facility]) NEGATIVE NEGATIVE Final   Influenza B ([facility]) NEGATIVE NEGATIVE Final    Comment: Performed at [facility], [address], [place], [place] [address]  Respiratory Panel by RT PCR (Flu A&B, Covid) - Nasopharyngeal Swab     Status: None   Collection Time: 03/06/19  7:25 PM   Specimen: Nasopharyngeal Swab  Result Value Ref Range Status   SARS Coronavirus 2 by RT PCR NEGATIVE NEGATIVE Final    Comment: (NOTE) SARS-CoV-2 target nucleic acids are NOT DETECTED. The SARS-CoV-2 RNA is generally detectable in upper respiratoy specimens during the acute phase  of infection. The lowest concentration of SARS-CoV-2 viral copies this assay can detect is 131 copies/mL. A negative result does not preclude SARS-Cov-2 infection and should not be used as the sole basis for treatment or other patient management decisions. A negative result may occur with  improper specimen collection/handling, submission of specimen other than nasopharyngeal swab, presence of viral mutation(s) within the areas targeted by this assay, and inadequate number of viral copies (<131 copies/mL). A negative result must be combined with clinical observations, patient history, and epidemiological information. The expected result is Negative. Fact Sheet for Patients:  PinkCheek.be Fact Sheet for Healthcare Providers:  GravelBags.it This test is not yet ap proved or cleared by the [place] FDA and  has been authorized for detection and/or diagnosis of SARS-CoV-2 by FDA under an Emergency Use Authorization (EUA). This EUA will remain  in effect (meaning this test can be used) for the duration of the COVID-19 declaration under Section 564(b)(1) of the Act, 21 U.S.C. section 360bbb-3(b)(1), unless the authorization is terminated or revoked sooner.    Influenza A by PCR NEGATIVE NEGATIVE Final   Influenza B by PCR NEGATIVE NEGATIVE Final    Comment: (NOTE) The Xpert Xpress SARS-CoV-2/FLU/RSV assay is intended as an aid in  the diagnosis of influenza from Nasopharyngeal swab specimens and  should not be used as a sole basis for treatment. Nasal washings and  aspirates are unacceptable for Xpert Xpress SARS-CoV-2/FLU/RSV  testing. Fact Sheet for Patients: PinkCheek.be Fact Sheet for Healthcare Providers: GravelBags.it This test is not yet approved or cleared by the [place] FDA and  has been authorized for detection and/or diagnosis of SARS-CoV-2 by  FDA  under an Emergency Use Authorization (EUA). This EUA will remain  in effect (meaning this test can be used) for the duration of the  Covid-19 declaration under Section 564(b)(1) of the Act, 21  U.S.C. section 360bbb-3(b)(1), unless the authorization is  terminated or revoked. Performed at [facility], [address], [place], [place] [address]      Studies: DG Chest 2 View  Result Date: 03/07/2019 CLINICAL DATA:  Cough and shortness of breath. Chest pain. EXAM: CHEST - 2 VIEW COMPARISON:  03/06/2019 and 12/07/2018 FINDINGS: The heart size and mediastinal contours are within normal limits. CABG. No effusions. Both lungs are clear. The visualized skeletal structures are unremarkable. IMPRESSION: No active cardiopulmonary disease. Electronically Signed   By: Lorriane Shire M.D.   On: 03/07/2019 09:38   DG Chest 2 View  Result Date: 03/06/2019 CLINICAL DATA:  Productive cough, shortness of breath. EXAM: CHEST - 2 VIEW COMPARISON:  December 07, 2018. FINDINGS: The heart size and mediastinal contours are within normal limits. Both lungs are clear. Status post coronary bypass graft. No pneumothorax or pleural effusion is noted. The visualized skeletal  structures are unremarkable. IMPRESSION: No active cardiopulmonary disease. Electronically Signed   By: Marijo Conception M.D.   On: 03/06/2019 17:55   DG Chest Port 1 View  Result Date: 03/06/2019 CLINICAL DATA:  Chest pain and cough. EXAM: PORTABLE CHEST 1 VIEW COMPARISON:  03/06/2019 and prior radiographs FINDINGS: The cardiomediastinal silhouette is unremarkable. CABG changes again noted. There is no evidence of focal airspace disease, pulmonary edema, suspicious pulmonary nodule/mass, pleural effusion, or pneumothorax. No acute bony abnormalities are identified. Degenerative changes in both shoulders again identified. IMPRESSION: No evidence of active cardiopulmonary disease. Electronically Signed   By: Margarette Canada M.D.   On: 03/06/2019  19:46   ECHOCARDIOGRAM COMPLETE  Result Date: 03/07/2019   ECHOCARDIOGRAM REPORT   Patient Name:   MONTZERRAT BRUNELL Baller Date of Exam: 03/07/2019 Medical Rec #:  [id number]       Height:       62.0 in Accession #:    [id number]      Weight:       170.0 lb Date of Birth:  [birth year]       BSA:          1.78 m Patient Age:    66 years        BP:           142/81 mmHg Patient Gender: F               HR:           68 bpm. Exam Location:  [facility] Procedure: 2D Echo, Color Doppler and Cardiac Doppler Indications:     Chest pain 786.50  History:         Patient has prior history of Echocardiogram examinations, most                  recent 08/04/2016. TIA and Stroke; Risk Factors:Hypertension.  Sonographer:     Sherrie Sport RDCS (AE) Referring Phys:  [id number] Arvella Merles MANSY Diagnosing Phys: Isaias Cowman MD IMPRESSIONS  1. Left ventricular ejection fraction, by visual estimation, is 55 to 60%. The left ventricle has normal function. There is moderately increased left ventricular hypertrophy.  2. Global right ventricle has normal systolic function.The right ventricular size is normal. No increase in right ventricular wall thickness.  3. Left atrial size was normal.  4. Right atrial size was normal.  5. The mitral valve is normal in structure. Mild mitral valve regurgitation. No evidence of mitral stenosis.  6. The tricuspid valve is normal in structure. Tricuspid valve regurgitation mild-moderate.  7. The aortic valve is normal in structure. Aortic valve regurgitation is not visualized. No evidence of aortic valve sclerosis or stenosis.  8. The pulmonic valve was normal in structure. Pulmonic valve regurgitation is not visualized.  9. Mildly elevated pulmonary artery systolic pressure. 10. The inferior vena cava is normal in size with greater than 50% respiratory variability, suggesting right atrial pressure of 3 mmHg. FINDINGS  Left Ventricle: Left ventricular ejection fraction, by visual estimation, is 55 to 60%. The left ventricle  has normal function. The left ventricle is not well visualized. There is moderately increased left ventricular hypertrophy. Normal left atrial pressure. Right Ventricle: The right ventricular size is normal. No increase in right ventricular wall thickness. Global RV systolic function is has normal systolic function. The tricuspid regurgitant velocity is 2.29 m/s, and with an assumed right atrial pressure  of 10 mmHg, the estimated right ventricular systolic pressure is mildly elevated at 31.0 mmHg. Left Atrium:  Left atrial size was normal in size. Right Atrium: Right atrial size was normal in size Pericardium: There is no evidence of pericardial effusion. Mitral Valve: The mitral valve is normal in structure. Mild mitral valve regurgitation. No evidence of mitral valve stenosis by observation. Tricuspid Valve: The tricuspid valve is normal in structure. Tricuspid valve regurgitation mild-moderate. Aortic Valve: The aortic valve is normal in structure. Aortic valve regurgitation is not visualized. The aortic valve is structurally normal, with no evidence of sclerosis or stenosis. Aortic valve mean gradient measures 3.3 mmHg. Aortic valve peak gradient measures 6.1 mmHg. Aortic valve area, by VTI measures 2.09 cm. Pulmonic Valve: The pulmonic valve was normal in structure. Pulmonic valve regurgitation is not visualized. Pulmonic regurgitation is not visualized. Aorta: The aortic root, ascending aorta and aortic arch are all structurally normal, with no evidence of dilitation or obstruction. Venous: The inferior vena cava is normal in size with greater than 50% respiratory variability, suggesting right atrial pressure of 3 mmHg. IAS/Shunts: No atrial level shunt detected by color flow Doppler. There is no evidence of a patent foramen ovale. No ventricular septal defect is seen or detected. There is no evidence of an atrial septal defect.  LEFT VENTRICLE PLAX 2D LVIDd:         3.51 cm  Diastology LVIDs:         2.51 cm   LV e' lateral:   4.13 cm/s LV PW:         1.23 cm  LV E/e' lateral: 21.3 LV IVS:        1.46 cm  LV e' medial:    4.13 cm/s LVOT diam:     2.00 cm  LV E/e' medial:  21.3 LV SV:         29 ml LV SV Index:   15.37 LVOT Area:     3.14 cm  RIGHT VENTRICLE RV Basal diam:  2.15 cm RV S prime:     9.25 cm/s TAPSE (M-mode): 4.7 cm LEFT ATRIUM             Index       RIGHT ATRIUM           Index LA diam:        3.50 cm 1.96 cm/m  RA Area:     17.20 cm LA Vol (A2C):   70.5 ml 39.52 ml/m RA Volume:   45.60 ml  25.56 ml/m LA Vol (A4C):   60.4 ml 33.85 ml/m LA Biplane Vol: 69.3 ml 38.84 ml/m  AORTIC VALVE                   PULMONIC VALVE AV Area (Vmax):    1.71 cm    PV Vmax:        0.67 m/s AV Area (Vmean):   1.82 cm    PV Peak grad:   1.8 mmHg AV Area (VTI):     2.09 cm    RVOT Peak grad: 1 mmHg AV Vmax:           123.67 cm/s AV Vmean:          84.433 cm/s AV VTI:            0.223 m AV Peak Grad:      6.1 mmHg AV Mean Grad:      3.3 mmHg LVOT Vmax:         67.20 cm/s LVOT Vmean:        49.000 cm/s LVOT VTI:  0.148 m LVOT/AV VTI ratio: 0.66  AORTA Ao Root diam: 2.30 cm MITRAL VALVE                        TRICUSPID VALVE MV Area (PHT): 3.48 cm             TR Peak grad:   21.0 mmHg MV PHT:        63.22 msec           TR Vmax:        229.00 cm/s MV Decel Time: 218 msec MV E velocity: 87.80 cm/s 103 cm/s  SHUNTS MV A velocity: 78.10 cm/s 70.3 cm/s Systemic VTI:  0.15 m MV E/A ratio:  1.12       1.5       Systemic Diam: 2.00 cm  Isaias Cowman MD Electronically signed by Isaias Cowman MD Signature Date/Time: 03/07/2019/1:41:46 PM    Final     Scheduled Meds: . aspirin  81 mg Oral Daily  . atorvastatin  80 mg Oral q1800  . azithromycin  250 mg Oral Daily  . budesonide (PULMICORT) nebulizer solution  0.5 mg Nebulization BID  . enoxaparin (LOVENOX) injection  40 mg Subcutaneous Q24H  . famotidine  40 mg Oral Daily  . furosemide  20 mg Oral QODAY  . insulin aspart  0-9 Units Subcutaneous TID WC   . ipratropium-albuterol  3 mL Nebulization Q6H  . isosorbide mononitrate  60 mg Oral Daily  . meclizine  12.5 mg Oral TID  . methylPREDNISolone (SOLU-MEDROL) injection  40 mg Intravenous Daily  . metoprolol tartrate  100 mg Oral BID  . pantoprazole  20 mg Oral BID  . Semaglutide(0.25 or 0.5MG/DOS)  0.25 mg Subcutaneous Weekly   Continuous Infusions: . sodium chloride 50 mL/hr at 03/08/19 0743    Assessment/Plan:  1. Acute bronchitis with wheezing on the right side.  Repeat chest x-ray negative for pneumonia.  Continue Solu-Medrol and Zithromax. 2. Unsteady gait with dizziness with physical therapy today.  I canceled the discharge today.  Start standing dose of low-dose meclizine.  MRI of the brain to rule out stroke.  Patient is not orthostatic. 3. Chest pain.  Cardiac enzymes negative.  Cardiology saw the patient and did not wish any further testing. 4. Paroxysmal atrial fibrillation on metoprolol.  Not on any anticoagulation. 5. Chronic diastolic congestive heart failure.  Continue oral Lasix and metoprolol. 6. Type 2 diabetes mellitus uncontrolled.  Patient on semaglutide and sliding scale insulin 7. Hyperlipidemia unspecified on atorvastatin  Code Status:     Code Status Orders  (From admission, onward)         Start     Ordered   03/06/19 2238  Full code  Continuous     03/06/19 2242        Code Status History    Date Active Date Inactive Code Status Order ID Comments User Context   09/19/2018 1352 09/19/2018 1830 Full Code [id number]  Corey Skains, MD Inpatient   03/27/2018 1632 03/28/2018 1840 Full Code [id number]  Dustin Flock, MD Inpatient   08/15/2017 2104 08/18/2017 1715 Full Code [id number]  Rubie Maid, MD Inpatient   12/23/2016 1722 12/24/2016 1639 Full Code [id number]  Gladstone Lighter, MD Inpatient   08/03/2016 2306 08/06/2016 1247 Full Code [id number]  Lance Coon, MD ED   07/26/2016 1639 07/27/2016 1811 Full Code [id number]  Demetrios Loll, MD Inpatient    11/07/2015 1711 11/10/2015 1552 Full Code [id number]  Gold,  Etheleen Mayhew Inpatient   11/05/2015 2155 11/07/2015 1646 Full Code [id number]  Henreitta Leber, MD Inpatient   Advance Care Planning Activity     Disposition Plan: Reevaluate daily  Consultants:  Cardiology  Antibiotics:  P.o. Zithromax  Time spent: 32 minutes.  Spoke with the patient's daughter on the phone twice today.  [place]  [facility] [employer]

## 2019-03-08 NOTE — Progress Notes (Signed)
Patient ID: Candace [name]  Patient admitted to [facility] on 03/06/2019 and discharged 03/08/2019.  May return to work on 03/16/2019.  Dr Loletha Grayer [facility]

## 2019-03-08 NOTE — Progress Notes (Signed)
[facility] Cardiology  SUBJECTIVE: Patient lying in bed, reports feeling much better, denies chest pain, wishes to go home   Vitals:   03/07/19 1913 03/07/19 1954 03/08/19 0307 03/08/19 0721  BP: 137/74  130/77 (!) 123/57  Pulse: 76  79 66  Resp:    14  Temp: 98.1 F (36.7 C)  98.2 F (36.8 C) 98.5 F (36.9 C)  TempSrc: Oral  Oral Oral  SpO2: 95% 94% 97% 95%  Weight:   77.3 kg   Height:         Intake/Output Summary (Last 24 hours) at 03/08/2019 [id number] Last data filed at 03/08/2019 [id number] Gross per 24 hour  Intake 1438.72 ml  Output 900 ml  Net 538.72 ml      PHYSICAL EXAM  General: Well developed, well nourished, in no acute distress HEENT:  Normocephalic and atramatic Neck:  No JVD.  Lungs: Clear bilaterally to auscultation and percussion. Heart: HRRR . Normal S1 and S2 without gallops or murmurs.  Abdomen: Bowel sounds are positive, abdomen soft and non-tender  Msk:  Back normal, normal gait. Normal strength and tone for age. Extremities: No clubbing, cyanosis or edema.   Neuro: Alert and oriented X 3. Psych:  Good affect, responds appropriately   LABS: Basic Metabolic Panel: Recent Labs    03/06/19 1813 03/06/19 1925  NA 136 136  K 3.9 3.6  CL 103 102  CO2 23 23  GLUCOSE 144* 145*  BUN 8 8  CREATININE 0.61 0.66  CALCIUM 9.2 9.0   Liver Function Tests: No results for input(s): AST, ALT, ALKPHOS, BILITOT, PROT, ALBUMIN in the last 72 hours. No results for input(s): LIPASE, AMYLASE in the last 72 hours. CBC: Recent Labs    03/06/19 1813 03/06/19 1925  WBC 6.5 6.5  NEUTROABS 2.5  --   HGB 14.0 13.6  HCT 40.8 40.2  MCV 92.1 93.5  PLT 221 229   Cardiac Enzymes: No results for input(s): CKTOTAL, CKMB, CKMBINDEX, TROPONINI in the last 72 hours. BNP: Invalid input(s): POCBNP D-Dimer: No results for input(s): DDIMER in the last 72 hours. Hemoglobin A1C: Recent Labs    03/06/19 1925  HGBA1C 9.5*   Fasting Lipid Panel: No results for input(s): CHOL,  HDL, LDLCALC, TRIG, CHOLHDL, LDLDIRECT in the last 72 hours. Thyroid Function Tests: No results for input(s): TSH, T4TOTAL, T3FREE, THYROIDAB in the last 72 hours.  Invalid input(s): FREET3 Anemia Panel: No results for input(s): VITAMINB12, FOLATE, FERRITIN, TIBC, IRON, RETICCTPCT in the last 72 hours.  DG Chest 2 View  Result Date: 03/07/2019 CLINICAL DATA:  Cough and shortness of breath. Chest pain. EXAM: CHEST - 2 VIEW COMPARISON:  03/06/2019 and 12/07/2018 FINDINGS: The heart size and mediastinal contours are within normal limits. CABG. No effusions. Both lungs are clear. The visualized skeletal structures are unremarkable. IMPRESSION: No active cardiopulmonary disease. Electronically Signed   By: Lorriane Shire M.D.   On: 03/07/2019 09:38   DG Chest 2 View  Result Date: 03/06/2019 CLINICAL DATA:  Productive cough, shortness of breath. EXAM: CHEST - 2 VIEW COMPARISON:  December 07, 2018. FINDINGS: The heart size and mediastinal contours are within normal limits. Both lungs are clear. Status post coronary bypass graft. No pneumothorax or pleural effusion is noted. The visualized skeletal structures are unremarkable. IMPRESSION: No active cardiopulmonary disease. Electronically Signed   By: Marijo Conception M.D.   On: 03/06/2019 17:55   DG Chest Port 1 View  Result Date: 03/06/2019 CLINICAL DATA:  Chest pain and cough.  EXAM: PORTABLE CHEST 1 VIEW COMPARISON:  03/06/2019 and prior radiographs FINDINGS: The cardiomediastinal silhouette is unremarkable. CABG changes again noted. There is no evidence of focal airspace disease, pulmonary edema, suspicious pulmonary nodule/mass, pleural effusion, or pneumothorax. No acute bony abnormalities are identified. Degenerative changes in both shoulders again identified. IMPRESSION: No evidence of active cardiopulmonary disease. Electronically Signed   By: Margarette Canada M.D.   On: 03/06/2019 19:46   ECHOCARDIOGRAM COMPLETE  Result Date: 03/07/2019    ECHOCARDIOGRAM REPORT   Patient Name:   Candace [name] Manny Date of Exam: 03/07/2019 Medical Rec #:  [id number]       Height:       62.0 in Accession #:    [id number]      Weight:       170.0 lb Date of Birth:  23-Jul-1950       BSA:          1.78 m Patient Age:    68 years        BP:           142/81 mmHg Patient Gender: F               HR:           68 bpm. Exam Location:  [facility] Procedure: 2D Echo, Color Doppler and Cardiac Doppler Indications:     Chest pain 786.50  History:         Patient has prior history of Echocardiogram examinations, most                  recent 08/04/2016. TIA and Stroke; Risk Factors:Hypertension.  Sonographer:     Sherrie Sport RDCS (AE) Referring Phys:  [id number] Arvella Merles MANSY Diagnosing Phys: Isaias Cowman MD IMPRESSIONS  1. Left ventricular ejection fraction, by visual estimation, is 55 to 60%. The left ventricle has normal function. There is moderately increased left ventricular hypertrophy.  2. Global right ventricle has normal systolic function.The right ventricular size is normal. No increase in right ventricular wall thickness.  3. Left atrial size was normal.  4. Right atrial size was normal.  5. The mitral valve is normal in structure. Mild mitral valve regurgitation. No evidence of mitral stenosis.  6. The tricuspid valve is normal in structure. Tricuspid valve regurgitation mild-moderate.  7. The aortic valve is normal in structure. Aortic valve regurgitation is not visualized. No evidence of aortic valve sclerosis or stenosis.  8. The pulmonic valve was normal in structure. Pulmonic valve regurgitation is not visualized.  9. Mildly elevated pulmonary artery systolic pressure. 10. The inferior vena cava is normal in size with greater than 50% respiratory variability, suggesting right atrial pressure of 3 mmHg. FINDINGS  Left Ventricle: Left ventricular ejection fraction, by visual estimation, is 55 to 60%. The left ventricle has normal function. The left ventricle is not well  visualized. There is moderately increased left ventricular hypertrophy. Normal left atrial pressure. Right Ventricle: The right ventricular size is normal. No increase in right ventricular wall thickness. Global RV systolic function is has normal systolic function. The tricuspid regurgitant velocity is 2.29 m/s, and with an assumed right atrial pressure  of 10 mmHg, the estimated right ventricular systolic pressure is mildly elevated at 31.0 mmHg. Left Atrium: Left atrial size was normal in size. Right Atrium: Right atrial size was normal in size Pericardium: There is no evidence of pericardial effusion. Mitral Valve: The mitral valve is normal in structure. Mild mitral valve regurgitation. No evidence of mitral  valve stenosis by observation. Tricuspid Valve: The tricuspid valve is normal in structure. Tricuspid valve regurgitation mild-moderate. Aortic Valve: The aortic valve is normal in structure. Aortic valve regurgitation is not visualized. The aortic valve is structurally normal, with no evidence of sclerosis or stenosis. Aortic valve mean gradient measures 3.3 mmHg. Aortic valve peak gradient measures 6.1 mmHg. Aortic valve area, by VTI measures 2.09 cm. Pulmonic Valve: The pulmonic valve was normal in structure. Pulmonic valve regurgitation is not visualized. Pulmonic regurgitation is not visualized. Aorta: The aortic root, ascending aorta and aortic arch are all structurally normal, with no evidence of dilitation or obstruction. Venous: The inferior vena cava is normal in size with greater than 50% respiratory variability, suggesting right atrial pressure of 3 mmHg. IAS/Shunts: No atrial level shunt detected by color flow Doppler. There is no evidence of a patent foramen ovale. No ventricular septal defect is seen or detected. There is no evidence of an atrial septal defect.  LEFT VENTRICLE PLAX 2D LVIDd:         3.51 cm  Diastology LVIDs:         2.51 cm  LV e' lateral:   4.13 cm/s LV PW:         1.23 cm   LV E/e' lateral: 21.3 LV IVS:        1.46 cm  LV e' medial:    4.13 cm/s LVOT diam:     2.00 cm  LV E/e' medial:  21.3 LV SV:         29 ml LV SV Index:   15.37 LVOT Area:     3.14 cm  RIGHT VENTRICLE RV Basal diam:  2.15 cm RV S prime:     9.25 cm/s TAPSE (M-mode): 4.7 cm LEFT ATRIUM             Index       RIGHT ATRIUM           Index LA diam:        3.50 cm 1.96 cm/m  RA Area:     17.20 cm LA Vol (A2C):   70.5 ml 39.52 ml/m RA Volume:   45.60 ml  25.56 ml/m LA Vol (A4C):   60.4 ml 33.85 ml/m LA Biplane Vol: 69.3 ml 38.84 ml/m  AORTIC VALVE                   PULMONIC VALVE AV Area (Vmax):    1.71 cm    PV Vmax:        0.67 m/s AV Area (Vmean):   1.82 cm    PV Peak grad:   1.8 mmHg AV Area (VTI):     2.09 cm    RVOT Peak grad: 1 mmHg AV Vmax:           123.67 cm/s AV Vmean:          84.433 cm/s AV VTI:            0.223 m AV Peak Grad:      6.1 mmHg AV Mean Grad:      3.3 mmHg LVOT Vmax:         67.20 cm/s LVOT Vmean:        49.000 cm/s LVOT VTI:          0.148 m LVOT/AV VTI ratio: 0.66  AORTA Ao Root diam: 2.30 cm MITRAL VALVE  TRICUSPID VALVE MV Area (PHT): 3.48 cm             TR Peak grad:   21.0 mmHg MV PHT:        63.22 msec           TR Vmax:        229.00 cm/s MV Decel Time: 218 msec MV E velocity: 87.80 cm/s 103 cm/s  SHUNTS MV A velocity: 78.10 cm/s 70.3 cm/s Systemic VTI:  0.15 m MV E/A ratio:  1.12       1.5       Systemic Diam: 2.00 cm  Isaias Cowman MD Electronically signed by Isaias Cowman MD Signature Date/Time: 03/07/2019/1:41:46 PM    Final      Echo   TELEMETRY: Sinus rhythm:  ASSESSMENT AND PLAN:  Active Problems:   Chest pain   Type 2 diabetes mellitus with hyperlipidemia (HCC)   Paroxysmal A-fib (HCC)   Chronic diastolic CHF (congestive heart failure) (HCC)   Acute bronchitis    1.  Chest pain, with typical and atypical features, nondiagnostic ECG which appeared unchanged, normal troponin, resolution of chest pain.  Patient has a  history of adverse reaction during [employer]. 2.  Coronary artery disease, status post CABG x3, recent cardiac catheterization 09/19/2018 revealed patent bypass grafts.  Recommendations  1.  Agree with current therapy 2.  Increase isosorbide mononitrate to 60 mg daily 3.  Defer Lexiscan Myoview with history of adverse reaction 4.  May discharge home today 5.  Follow-up with Dr. Nehemiah Massed in 1 week   Isaias Cowman, MD, PhD, [facility] 03/08/2019 7:52 AM

## 2019-03-08 NOTE — Evaluation (Signed)
Physical Therapy Evaluation Patient Details Name: Candace [name] MRN: [id number] DOB: 12-06-1950 Today's Date: 03/08/2019   History of Present Illness  Patient is 69 yo female that presented to ED for chest pain, SOB, lightheadness. Per cardiology no further work up indicated. PMH of anxiety, depression, , DM, MI [id number]) s/p CABGx3, CVA.    Clinical Impression  Patient in bed, A&Ox4, no pain at rest. The patient reported prior to admission she was independent at baseline, and her children assist if she wants them to, she drives, cooks, cleans, no falls in the last 6 months.  The patient demonstrated bed mobility modI, and performed sit <> stand with IV pole, and once without device (but reliant on chair arms for safety). The patient initially demonstrated gait WFLs with IV pole for single UE support. With extended time on her feet, the patient began to exhibit unsteadiness, fatigue, needed several repetitive standing rest breaks, and endorsed feeling light headed like she was "in a tunnel" and things were very far away. Pt firmly instructed in sitting rest break in hall. BP, HR, and spO2 WFLs. The patient needed bilateral UE support and minA to return to room due to unsteadiness after resting. PT and pt discussed safety concerns at home, use of [facility] and necessity for family assist if she is to return home. The patient would benefit from further skilled PT intervention to address current deficits and return to PLOF. Recommendation is HHPT with supervision for mobility/OOB. RN and MD notified of patient's presentation during ambulation.     Follow Up Recommendations Home health PT;Supervision for mobility/OOB    Equipment Recommendations  Other (comment)(Patient reported having a SPC at home)    Recommendations for Other Services       Precautions / Restrictions Precautions Precautions: Fall Precaution Comments: watch vitals Restrictions Weight Bearing Restrictions: No       Mobility  Bed Mobility Overal bed mobility: Modified Independent                Transfers Overall transfer level: Needs assistance Equipment used: None Transfers: Sit to/from Stand Sit to Stand: Supervision         General transfer comment: Utilized IV pole and then no support except chair arms during session. Slow, but steady  Ambulation/Gait   Gait Distance (Feet): 170 Feet Assistive device: IV Pole;None   Gait velocity: decreased   General Gait Details: The patient initially demonstrated gait WFLs. With extended time on her feet, the patient began to exhibit unsteadiness, fatigue, needed several repetitive standing rest breaks, and endorsed feeling light headed like she was "in a tunnel" and things were very far away. Pt firmly instructed in sitting rest break.  Stairs            Wheelchair Mobility    Modified Rankin (Stroke Patients Only)       Balance Overall balance assessment: Needs assistance Sitting-balance support: Feet supported Sitting balance-[name] Scale: Good       Standing balance-[name] Scale: Poor Standing balance comment: regressed from fair to poor with fatigue/ambulation                             Pertinent Vitals/Pain Pain Assessment: No/denies pain    Home Living Family/patient expects to be discharged to:: Private residence Living Arrangements: Alone Available Help at Discharge: Family;Available PRN/intermittently Type of Home: Apartment Home Access: Level entry     Home Layout: One level Home Equipment: Cane - single  point;Walker - standard      Prior Function Level of Independence: Independent         Comments: pt drives, cooks, cleans at baseline     Hand Dominance        Extremity/Trunk Assessment   Upper Extremity Assessment Upper Extremity Assessment: Overall WFL for tasks assessed    Lower Extremity Assessment Lower Extremity Assessment: Generalized weakness    Cervical / Trunk  Assessment Cervical / Trunk Assessment: Normal  Communication   Communication: No difficulties  Cognition Arousal/Alertness: Awake/alert Behavior During Therapy: WFL for tasks assessed/performed Overall Cognitive Status: Within Functional Limits for tasks assessed                                        General Comments      Exercises     Assessment/Plan    PT Assessment Patient needs continued PT services  PT Problem List Decreased strength;Decreased knowledge of use of DME;Decreased activity tolerance;Decreased safety awareness;Decreased balance;Decreased knowledge of precautions;Decreased mobility       PT Treatment Interventions DME instruction;Gait training;Neuromuscular re-education;Balance training;Stair training;Patient/family education;Functional mobility training;Therapeutic activities;Therapeutic exercise    PT Goals (Current goals can be found in the Care Plan section)  Acute Rehab PT Goals Patient Stated Goal: to go home PT Goal Formulation: With patient Time For Goal Achievement: 03/22/19 Potential to Achieve Goals: Good    Frequency Min 2X/week   Barriers to discharge        Co-evaluation               AM-PAC PT "6 Clicks" Mobility  Outcome Measure Help needed turning from your back to your side while in a flat bed without using bedrails?: None Help needed moving from lying on your back to sitting on the side of a flat bed without using bedrails?: None Help needed moving to and from a bed to a chair (including a wheelchair)?: None Help needed standing up from a chair using your arms (e.g., wheelchair or bedside chair)?: None Help needed to walk in hospital room?: A Little Help needed climbing 3-5 steps with a railing? : A Lot 6 Click Score: 21    End of Session Equipment Utilized During Treatment: Gait belt Activity Tolerance: Patient limited by fatigue Patient left: in chair;with call bell/phone within reach Nurse  Communication: Mobility status PT Visit Diagnosis: Other abnormalities of gait and mobility (R26.89);Muscle weakness (generalized) (M62.81);Difficulty in walking, not elsewhere classified (R26.2)    Time: [id number] PT Time Calculation (min) (ACUTE ONLY): 33 min   Charges:   PT Evaluation $PT Eval Low Complexity: 1 Low PT Treatments $Therapeutic Exercise: 8-22 mins        Lieutenant Diego PT, DPT 10:05 AM,03/08/19 [phone]

## 2019-03-08 NOTE — Telephone Encounter (Signed)
Copied from [place] [id number]. Topic: Appointment Scheduling - Scheduling Inquiry for Clinic >> Mar 08, 2019  9:25 AM Rayann Heman wrote: Reason for CRM: hospital called and would like pt to have hospital follow up within 7 days. Unable to schedule. Can she be worked in or schedule with another provider

## 2019-03-09 DIAGNOSIS — R42 Dizziness and giddiness: Secondary | ICD-10-CM

## 2019-03-09 LAB — CBC
HCT: 31.1 % — ABNORMAL LOW (ref 36.0–46.0)
Hemoglobin: 10.4 g/dL — ABNORMAL LOW (ref 12.0–15.0)
MCH: 31.5 pg (ref 26.0–34.0)
MCHC: 33.4 g/dL (ref 30.0–36.0)
MCV: 94.2 fL (ref 80.0–100.0)
Platelets: 198 10*3/uL (ref 150–400)
RBC: 3.3 MIL/uL — ABNORMAL LOW (ref 3.87–5.11)
RDW: 12.2 % (ref 11.5–15.5)
WBC: 7.6 10*3/uL (ref 4.0–10.5)
nRBC: 0 % (ref 0.0–0.2)

## 2019-03-09 LAB — BASIC METABOLIC PANEL
Anion gap: 5 (ref 5–15)
BUN: 10 mg/dL (ref 8–23)
CO2: 26 mmol/L (ref 22–32)
Calcium: 8.3 mg/dL — ABNORMAL LOW (ref 8.9–10.3)
Chloride: 110 mmol/L (ref 98–111)
Creatinine, Ser: 0.76 mg/dL (ref 0.44–1.00)
GFR calc Af Amer: >60 mL/min (ref >60)
GFR calc non Af Amer: >60 mL/min (ref >60)
Glucose, Bld: 177 mg/dL — ABNORMAL HIGH (ref 70–99)
Potassium: 3.7 mmol/L (ref 3.5–5.1)
Sodium: 141 mmol/L (ref 135–145)

## 2019-03-09 LAB — GLUCOSE, CAPILLARY
Glucose-Capillary: 151 mg/dL — ABNORMAL HIGH (ref 70–99)
Glucose-Capillary: 191 mg/dL — ABNORMAL HIGH (ref 70–99)

## 2019-03-09 MED ORDER — MECLIZINE HCL 12.5 MG PO TABS: 12.5000 mg | ORAL_TABLET | Freq: Three times a day (TID) | ORAL | 0 refills | Status: DC | PRN

## 2019-03-09 MED ORDER — BISACODYL 5 MG PO TBEC
10.0000 mg | DELAYED_RELEASE_TABLET | Freq: Every day | ORAL | Status: DC | PRN
  Administered 2019-03-09: 10 mg via ORAL
  Filled 2019-03-09: qty 2

## 2019-03-09 NOTE — Care Management Important Message (Signed)
Important Message  Patient Details  Name: Candace [name] MRN: [id number] Date of Birth: 08-11-1950   Medicare Important Message Given:  Yes  Initial Medicare IM given by Patient Access Associate on 03/08/2019 at 12pm.  Still valid.   Dannette Barbara 03/09/2019, 9:08 AM

## 2019-03-09 NOTE — Progress Notes (Signed)
Physical Therapy Treatment Patient Details Name: Candace [name] MRN: [id number] DOB: 03-31-1950 Today's Date: 03/09/2019    History of Present Illness Patient is 68 yo female that presented to ED for chest pain, SOB, lightheadness. Per cardiology no further work up indicated. PMH of anxiety, depression, , DM, MI [id number]) s/p CABGx3, CVA.    PT Comments    Patient alert, oriented, agreeable to mobilize with PT, eager to go home. The patient demonstrated bed mobility modI and sit <> Stand transfers with RW and supervision. The patient ambulated ~50ft with RW and CGA. Patient with improved safety with ambulation this session with RW. Significantly decreased gait velocity noted, and several standing rest breaks, but pt reported/demonstrated improvement from prior session. Patient fatigued at end of ambulation. PT and pt discussed options for safety at home such as family support to ensure safety mobilizing, or staying with her daughter over the weekend. Patient stated she would think about it. Pt informed PT that she does not have a walker at home with wheels, DME recommendations updated to recommend a RW at this time, case management informed. The patient would benefit from further skilled PT intervention to address these deficits and return to PLOF as able.    Follow Up Recommendations  Home health PT;Supervision for mobility/OOB     Equipment Recommendations  Rolling walker with 5" wheels    Recommendations for Other Services       Precautions / Restrictions Precautions Precautions: Fall Precaution Comments: watch vitals Restrictions Weight Bearing Restrictions: No    Mobility  Bed Mobility Overal bed mobility: Modified Independent                Transfers Overall transfer level: Needs assistance Equipment used: Rolling walker (2 wheeled) Transfers: Sit to/from Stand Sit to Stand: Supervision         General transfer comment: improved safety with  RW  Ambulation/Gait Ambulation/Gait assistance: Min guard Gait Distance (Feet): 70 Feet Assistive device: Rolling walker (2 wheeled)       General Gait Details: Patient with improved safety with ambulation this session with RW. Significantly decreased gait velocity noted, and several standing rest breaks, but pt reported/demonstrated improvement from prior session.   Stairs             Wheelchair Mobility    Modified Rankin (Stroke Patients Only)       Balance Overall balance assessment: Needs assistance Sitting-balance support: Feet supported Sitting balance-[name] Scale: Good                                      Cognition Arousal/Alertness: Awake/alert Behavior During Therapy: WFL for tasks assessed/performed Overall Cognitive Status: Within Functional Limits for tasks assessed                                        Exercises      General Comments        Pertinent Vitals/Pain Pain Assessment: No/denies pain    Home Living                      Prior Function            PT Goals (current goals can now be found in the care plan section) Progress towards PT goals: Progressing toward goals  Frequency    Min 2X/week      PT Plan Current plan remains appropriate;Equipment recommendations need to be updated    Co-evaluation              AM-PAC PT "6 Clicks" Mobility   Outcome Measure  Help needed turning from your back to your side while in a flat bed without using bedrails?: None Help needed moving from lying on your back to sitting on the side of a flat bed without using bedrails?: None Help needed moving to and from a bed to a chair (including a wheelchair)?: None Help needed standing up from a chair using your arms (e.g., wheelchair or bedside chair)?: None Help needed to walk in hospital room?: A Little Help needed climbing 3-5 steps with a railing? : A Lot 6 Click Score: 21    End of  Session Equipment Utilized During Treatment: Gait belt Activity Tolerance: Patient limited by fatigue Patient left: with bed alarm set;in bed Nurse Communication: Mobility status PT Visit Diagnosis: Other abnormalities of gait and mobility (R26.89);Muscle weakness (generalized) (M62.81);Difficulty in walking, not elsewhere classified (R26.2)     Time: [id number] PT Time Calculation (min) (ACUTE ONLY): 11 min  Charges:  $Therapeutic Exercise: 8-22 mins                     Lieutenant Diego PT, DPT 11:09 AM,03/09/19 [phone]

## 2019-03-09 NOTE — Progress Notes (Signed)
[facility] Cardiology  SUBJECTIVE: Patient lying in bed, reports feeling better, denies chest pain, wishes to go home   Vitals:   03/08/19 1040 03/08/19 1547 03/08/19 2107 03/09/19 0610  BP: 132/66 120/71 132/72 (!) 160/85  Pulse: 74 71 68 70  Resp:  16 16 16   Temp:  98 F (36.7 C) 98 F (36.7 C) 97.7 F (36.5 C)  TempSrc:  Oral Oral Oral  SpO2: 96% 97% 92% 95%  Weight:      Height:         Intake/Output Summary (Last 24 hours) at 03/09/2019 0836 Last data filed at 03/09/2019 [id number] Gross per 24 hour  Intake 780.75 ml  Output 600 ml  Net 180.75 ml      PHYSICAL EXAM  General: Well developed, well nourished, in no acute distress HEENT:  Normocephalic and atramatic Neck:  No JVD.  Lungs: Clear bilaterally to auscultation and percussion. Heart: HRRR . Normal S1 and S2 without gallops or murmurs.  Abdomen: Bowel sounds are positive, abdomen soft and non-tender  Msk:  Back normal, normal gait. Normal strength and tone for age. Extremities: No clubbing, cyanosis or edema.   Neuro: Alert and oriented X 3. Psych:  Good affect, responds appropriately   LABS: Basic Metabolic Panel: Recent Labs    03/06/19 1925 03/09/19 0433  NA 136 141  K 3.6 3.7  CL 102 110  CO2 23 26  GLUCOSE 145* 177*  BUN 8 10  CREATININE 0.66 0.76  CALCIUM 9.0 8.3*   Liver Function Tests: No results for input(s): AST, ALT, ALKPHOS, BILITOT, PROT, ALBUMIN in the last 72 hours. No results for input(s): LIPASE, AMYLASE in the last 72 hours. CBC: Recent Labs    03/06/19 1813 03/06/19 1925 03/09/19 0433  WBC 6.5 6.5 7.6  NEUTROABS 2.5  --   --   HGB 14.0 13.6 10.4*  HCT 40.8 40.2 31.1*  MCV 92.1 93.5 94.2  PLT 221 229 198   Cardiac Enzymes: No results for input(s): CKTOTAL, CKMB, CKMBINDEX, TROPONINI in the last 72 hours. BNP: Invalid input(s): POCBNP D-Dimer: No results for input(s): DDIMER in the last 72 hours. Hemoglobin A1C: Recent Labs    03/06/19 1925  HGBA1C 9.5*   Fasting  Lipid Panel: No results for input(s): CHOL, HDL, LDLCALC, TRIG, CHOLHDL, LDLDIRECT in the last 72 hours. Thyroid Function Tests: No results for input(s): TSH, T4TOTAL, T3FREE, THYROIDAB in the last 72 hours.  Invalid input(s): FREET3 Anemia Panel: No results for input(s): VITAMINB12, FOLATE, FERRITIN, TIBC, IRON, RETICCTPCT in the last 72 hours.  DG Chest 2 View  Result Date: 03/07/2019 CLINICAL DATA:  Cough and shortness of breath. Chest pain. EXAM: CHEST - 2 VIEW COMPARISON:  03/06/2019 and 12/07/2018 FINDINGS: The heart size and mediastinal contours are within normal limits. CABG. No effusions. Both lungs are clear. The visualized skeletal structures are unremarkable. IMPRESSION: No active cardiopulmonary disease. Electronically Signed   By: Lorriane Shire M.D.   On: 03/07/2019 09:38   MR BRAIN WO CONTRAST  Result Date: 03/08/2019 CLINICAL DATA:  Generalized muscle weakness. Dizziness and unsteady gait beginning today. EXAM: MRI HEAD WITHOUT CONTRAST TECHNIQUE: Multiplanar, multiecho pulse sequences of the brain and surrounding structures were obtained without intravenous contrast. COMPARISON:  Head CT 10/29/2017. MRI 04/23/2016. FINDINGS: Brain: Diffusion imaging does not show any acute or subacute infarction. Minimal small vessel change affects the pons. Single old small vessel infarction in the right cerebellum. Cerebral hemispheres show a few old lacunar infarctions in the basal ganglia and  moderate chronic small-vessel ischemic change the white matter, including a lesion of the posterior body of the corpus callosum. Findings are mildly progressive since 2018. No large vessel territory infarction. No mass lesion, hemorrhage, hydrocephalus or extra-axial collection Vascular: Major vessels at the base of the brain show flow. Skull and upper cervical spine: Negative Sinuses/Orbits: Clear/normal Other: None IMPRESSION: No acute or reversible finding. Moderate chronic small-vessel ischemic changes  throughout the brain, mildly progressive since 2018. Electronically Signed   By: Nelson Chimes M.D.   On: 03/08/2019 15:25   ECHOCARDIOGRAM COMPLETE  Result Date: 03/07/2019   ECHOCARDIOGRAM REPORT   Patient Name:   Candace [name] Date of Exam: 03/07/2019 Medical Rec #:  [id number]       Height:       62.0 in Accession #:    [id number]      Weight:       170.0 lb Date of Birth:  1950-06-16       BSA:          1.78 m Patient Age:    68 years        BP:           142/81 mmHg Patient Gender: F               HR:           68 bpm. Exam Location:  [facility] Procedure: 2D Echo, Color Doppler and Cardiac Doppler Indications:     Chest pain 786.50  History:         Patient has prior history of Echocardiogram examinations, most                  recent 08/04/2016. TIA and Stroke; Risk Factors:Hypertension.  Sonographer:     Sherrie Sport RDCS (AE) Referring Phys:  [id number] Arvella Merles MANSY Diagnosing Phys: Isaias Cowman MD IMPRESSIONS  1. Left ventricular ejection fraction, by visual estimation, is 55 to 60%. The left ventricle has normal function. There is moderately increased left ventricular hypertrophy.  2. Global right ventricle has normal systolic function.The right ventricular size is normal. No increase in right ventricular wall thickness.  3. Left atrial size was normal.  4. Right atrial size was normal.  5. The mitral valve is normal in structure. Mild mitral valve regurgitation. No evidence of mitral stenosis.  6. The tricuspid valve is normal in structure. Tricuspid valve regurgitation mild-moderate.  7. The aortic valve is normal in structure. Aortic valve regurgitation is not visualized. No evidence of aortic valve sclerosis or stenosis.  8. The pulmonic valve was normal in structure. Pulmonic valve regurgitation is not visualized.  9. Mildly elevated pulmonary artery systolic pressure. 10. The inferior vena cava is normal in size with greater than 50% respiratory variability, suggesting right atrial pressure of 3 mmHg.  FINDINGS  Left Ventricle: Left ventricular ejection fraction, by visual estimation, is 55 to 60%. The left ventricle has normal function. The left ventricle is not well visualized. There is moderately increased left ventricular hypertrophy. Normal left atrial pressure. Right Ventricle: The right ventricular size is normal. No increase in right ventricular wall thickness. Global RV systolic function is has normal systolic function. The tricuspid regurgitant velocity is 2.29 m/s, and with an assumed right atrial pressure  of 10 mmHg, the estimated right ventricular systolic pressure is mildly elevated at 31.0 mmHg. Left Atrium: Left atrial size was normal in size. Right Atrium: Right atrial size was normal in size Pericardium: There is no evidence of  pericardial effusion. Mitral Valve: The mitral valve is normal in structure. Mild mitral valve regurgitation. No evidence of mitral valve stenosis by observation. Tricuspid Valve: The tricuspid valve is normal in structure. Tricuspid valve regurgitation mild-moderate. Aortic Valve: The aortic valve is normal in structure. Aortic valve regurgitation is not visualized. The aortic valve is structurally normal, with no evidence of sclerosis or stenosis. Aortic valve mean gradient measures 3.3 mmHg. Aortic valve peak gradient measures 6.1 mmHg. Aortic valve area, by VTI measures 2.09 cm. Pulmonic Valve: The pulmonic valve was normal in structure. Pulmonic valve regurgitation is not visualized. Pulmonic regurgitation is not visualized. Aorta: The aortic root, ascending aorta and aortic arch are all structurally normal, with no evidence of dilitation or obstruction. Venous: The inferior vena cava is normal in size with greater than 50% respiratory variability, suggesting right atrial pressure of 3 mmHg. IAS/Shunts: No atrial level shunt detected by color flow Doppler. There is no evidence of a patent foramen ovale. No ventricular septal defect is seen or detected. There is no  evidence of an atrial septal defect.  LEFT VENTRICLE PLAX 2D LVIDd:         3.51 cm  Diastology LVIDs:         2.51 cm  LV e' lateral:   4.13 cm/s LV PW:         1.23 cm  LV E/e' lateral: 21.3 LV IVS:        1.46 cm  LV e' medial:    4.13 cm/s LVOT diam:     2.00 cm  LV E/e' medial:  21.3 LV SV:         29 ml LV SV Index:   15.37 LVOT Area:     3.14 cm  RIGHT VENTRICLE RV Basal diam:  2.15 cm RV S prime:     9.25 cm/s TAPSE (M-mode): 4.7 cm LEFT ATRIUM             Index       RIGHT ATRIUM           Index LA diam:        3.50 cm 1.96 cm/m  RA Area:     17.20 cm LA Vol (A2C):   70.5 ml 39.52 ml/m RA Volume:   45.60 ml  25.56 ml/m LA Vol (A4C):   60.4 ml 33.85 ml/m LA Biplane Vol: 69.3 ml 38.84 ml/m  AORTIC VALVE                   PULMONIC VALVE AV Area (Vmax):    1.71 cm    PV Vmax:        0.67 m/s AV Area (Vmean):   1.82 cm    PV Peak grad:   1.8 mmHg AV Area (VTI):     2.09 cm    RVOT Peak grad: 1 mmHg AV Vmax:           123.67 cm/s AV Vmean:          84.433 cm/s AV VTI:            0.223 m AV Peak Grad:      6.1 mmHg AV Mean Grad:      3.3 mmHg LVOT Vmax:         67.20 cm/s LVOT Vmean:        49.000 cm/s LVOT VTI:          0.148 m LVOT/AV VTI ratio: 0.66  AORTA Ao Root diam: 2.30 cm MITRAL  VALVE                        TRICUSPID VALVE MV Area (PHT): 3.48 cm             TR Peak grad:   21.0 mmHg MV PHT:        63.22 msec           TR Vmax:        229.00 cm/s MV Decel Time: 218 msec MV E velocity: 87.80 cm/s 103 cm/s  SHUNTS MV A velocity: 78.10 cm/s 70.3 cm/s Systemic VTI:  0.15 m MV E/A ratio:  1.12       1.5       Systemic Diam: 2.00 cm  Isaias Cowman MD Electronically signed by Isaias Cowman MD Signature Date/Time: 03/07/2019/1:41:46 PM    Final      Echo LVEF 55 to 60%  TELEMETRY: Sinus rhythm:  ASSESSMENT AND PLAN:  Active Problems:   Chest pain   Type 2 diabetes mellitus with hyperlipidemia (HCC)   Paroxysmal A-fib (HCC)   Chronic diastolic CHF (congestive heart failure)  (HCC)   Acute bronchitis   Unsteady gait    1.  Chest pain, with atypical features, nondiagnostic ECG, normal high-sensitivity troponin (7, 9, 8), with history of adverse reaction during Lexiscan Myoview 2.  Coronary artery disease, status post CABG x3, with recent cardiac catheterization 09/19/2018 revealing patent bypass grafts  Recommendations  1.  Agree with current therapy 2.  Increased isosorbide mononitrate to 60 mg daily 3.  Defer Lexiscan Myoview with history of prior adverse reaction 4.  Defer repeat cardiac catheterization which was just performed 09/19/2018 5.  Patient stable for discharge from cardiovascular perspective 6.  Follow-up with Dr. Nehemiah Massed in 1 week  Sign off for now, please call if any questions   Isaias Cowman, MD, PhD, [facility] 03/09/2019 8:36 AM

## 2019-03-09 NOTE — TOC Transition Note (Signed)
Transition of Care [facility]) - CM/SW Discharge Note   Patient Details  Name: Candace [name] MRN: [id number] Date of Birth: 1950-06-16  Transition of [facility]) CM/SW Contact:  Ross Ludwig, LCSW Phone Number: 03/09/2019, 7:01 PM   Clinical Narrative:     Patient will be discharging home with home health.  Cheryl at [facility] said they can see patient, it may not be till next week, but they can accept patient for home health PT.  Patient requested a rolling walker which was provided for patient.  Patient did not have any other needs.  Final next level of care: [place] Barriers to Discharge: Barriers Resolved   Patient Goals and CMS Choice Patient states their goals for this hospitalization and ongoing recovery are:: To return back home with home health CMS Medicare.gov Compare Post Acute Care list provided to:: Patient Choice offered to / list presented to : Patient  Discharge Placement  Patient discharged home with home health.                     Discharge Plan and Services  Home health PT.              DME Arranged: Walker rolling DME Agency: [employer] Date DME Agency Contacted: 03/09/19 Time DME Agency Contacted: 82 Representative spoke with at DME Agency: [place]: PT [place]: [place] Date [place]: 03/09/19 Time [place]: 1500 Representative spoke with at [place]: [place] Determinants of Health ([place]) Interventions     Readmission Risk Interventions No flowsheet data found.

## 2019-03-09 NOTE — Discharge Summary (Signed)
[place] [facility] [place] NAME: Natalynn Kortan    MR#:  [id number]  DATE OF BIRTH:  [birth year]  DATE OF ADMISSION:  03/06/2019 ADMITTING PHYSICIAN: Christel Mormon, MD  DATE OF DISCHARGE: 03/09/2019  1:18 PM  PRIMARY CARE PHYSICIAN: [name]., MD    ADMISSION DIAGNOSIS:  Cough [R05] EKG abnormalities [R94.31] Chest pain, unspecified type [R07.9]  DISCHARGE DIAGNOSIS:  Active Problems:   Chest pain   Type 2 diabetes mellitus with hyperlipidemia (HCC)   Paroxysmal A-fib (HCC)   Chronic diastolic CHF (congestive heart failure) (HCC)   Acute bronchitis   Unsteady gait   SECONDARY DIAGNOSIS:   Past Medical History:  Diagnosis Date  . Allergy   . Anemia   . Anxiety   . Arrhythmia   . Arthritis   . Coronary artery disease   . Depression   . Diabetes mellitus without complication ([place])   . Dyspnea    doe  . Dysrhythmia   . GERD (gastroesophageal reflux disease)   . Headache   . History of hiatal hernia   . Hyperlipidemia   . Hypertension   . Myocardial infarction ([place])    2016, 04/2017  . Myocardial infarction with cardiac rehabilitation [facility])    MI 2016/ CABG 8/17    FINISHED CARDIAC REHAB 3 WEEKS AGO  . Panic attack   . Pneumonia   . Reflux   . Stroke [facility]) 2015   showed up on MRI; no weakness noted  . TIA (transient ischemic attack)   . Voice tremor     HOSPITAL COURSE:   1.  Acute vertigo with unsteady gait and dizziness.  The patient was seen yesterday by physical therapy and was very unsteady with walking.  She was started on meclizine.  MRI of the brain was negative for stroke.  The patient was seen on the day of discharge and did better with physical therapy but they did recommend home health and a rolling walker.  The patient feels that she had a sinus infection.  The patient was not orthostatic on presentation. 2.  Acute bronchitis with wheezing on the right side.  Repeat chest x-ray was negative for  pneumonia.  The patient was on Solu-Medrol during the hospital course and Zithromax here.  Continue prednisone for a few more days and Zithromax to complete a course upon discharge home. 3.  Chest pain.  Cardiac enzymes negative.  Cardiology saw the patient and did not wish to do any further testing. 4.  Paroxysmal atrial fibrillation on metoprolol for rate control.  On aspirin only for anticoagulation. 5.  Chronic diastolic congestive heart failure and hypertension restart oral Lasix and continue metoprolol. 6.  Type 2 diabetes mellitus uncontrolled.  Patient on semaglutide and sliding scale while here. 7.  Hyperlipidemia unspecified on atorvastatin  DISCHARGE CONDITIONS:   Satisfactory  CONSULTS OBTAINED:  Treatment Team:  Isaias Cowman, MD  DRUG ALLERGIES:   Allergies  Allergen Reactions  . Lisinopril Cough  . Penicillins Swelling, Rash and Other (See Comments)    Did it involve swelling of the face/tongue/throat, SOB, or low BP? Yes Did it involve sudden or severe rash/hives, skin peeling, or any reaction on the inside of your mouth or nose? Yes Did you need to seek medical attention at a hospital or doctor's office? Yes When did it last happen?68 years If all above answers are "NO", may proceed with cephalosporin use.     DISCHARGE MEDICATIONS:   Allergies  as of 03/09/2019      Reactions   Lisinopril Cough   Penicillins Swelling, Rash, Other (See Comments)   Did it involve swelling of the face/tongue/throat, SOB, or low BP? Yes Did it involve sudden or severe rash/hives, skin peeling, or any reaction on the inside of your mouth or nose? Yes Did you need to seek medical attention at a hospital or doctor's office? Yes When did it last happen?68 years If all above answers are "NO", may proceed with cephalosporin use.      Medication List    TAKE these medications   acetaminophen 325 MG tablet Commonly known as: TYLENOL Take 650 mg by mouth every 6  (six) hours as needed for moderate pain or headache.   atorvastatin 80 MG tablet Commonly known as: LIPITOR Take 80 mg by mouth every evening.   azithromycin 250 MG tablet Commonly known as: ZITHROMAX Take 1 tablet (250 mg total) by mouth daily for 2 days.   Combivent Respimat 20-100 MCG/ACT Aers respimat Generic drug: Ipratropium-Albuterol Inhale 1 puff into the lungs every 6 (six) hours as needed for wheezing or shortness of breath.   [employer] Aspirin Adult Low Dose 81 MG chewable tablet Generic drug: aspirin CHEW 1 TABLET (81 MG TOTAL) BY MOUTH DAILY. What changed: See the new instructions.   famotidine 40 MG tablet Commonly known as: PEPCID Take 40 mg by mouth daily.   furosemide 20 MG tablet Commonly known as: LASIX Take 20 mg by mouth every other day.   isosorbide mononitrate 60 MG 24 hr tablet Commonly known as: IMDUR Take 1 tablet (60 mg total) by mouth daily. What changed: medication strength   loperamide 2 MG capsule Commonly known as: IMODIUM Take 2-4 mg by mouth as needed for diarrhea or loose stools.   meclizine 12.5 MG tablet Commonly known as: ANTIVERT Take 1 tablet (12.5 mg total) by mouth 3 (three) times daily as needed for dizziness.   metFORMIN 1000 MG tablet Commonly known as: GLUCOPHAGE Take 1 tablet (1,000 mg total) by mouth 2 (two) times daily with a meal.   metoprolol tartrate 100 MG tablet Commonly known as: LOPRESSOR Take 100 mg by mouth 2 (two) times daily.   nitroGLYCERIN 0.4 MG SL tablet Commonly known as: NITROSTAT Place 1 tablet (0.4 mg total) under the tongue every 5 (five) minutes as needed for chest pain.   ondansetron 8 MG disintegrating tablet Commonly known as: ZOFRAN-ODT DISSOLVE 1 TABLET ON THE TONGUE EVERY 8 HOURS AS NEEDED FOR NAUSEA OR VOMITING   Ozempic (0.25 or 0.5 MG/DOSE) 2 MG/1.5ML Sopn Generic drug: Semaglutide(0.25 or 0.5MG /DOS) Inject 0.25 mg into the skin once a week.   pantoprazole 20 MG tablet Commonly known  as: Protonix Take 1 tablet (20 mg total) by mouth 2 (two) times daily.   predniSONE 10 MG tablet Commonly known as: DELTASONE Four tablets po daily for 3 days then stop            Durable Medical Equipment  (From admission, onward)         Start     Ordered   03/09/19 1113  For home use only DME Walker rolling  Once    Question:  Patient needs a walker to treat with the following condition  Answer:  Unsteady gait   03/09/19 1112           DISCHARGE INSTRUCTIONS:   Follow-up PMD 5 days Follow-up Dr. Nehemiah Massed cardiology 1 week  If you experience worsening of your admission  symptoms, develop shortness of breath, life threatening emergency, suicidal or homicidal thoughts you must seek medical attention immediately by calling 911 or calling your MD immediately  if symptoms less severe.  You Must read complete instructions/literature along with all the possible adverse reactions/side effects for all the Medicines you take and that have been prescribed to you. Take any new Medicines after you have completely understood and accept all the possible adverse reactions/side effects.   Please note  You were cared for by a hospitalist during your hospital stay. If you have any questions about your discharge medications or the care you received while you were in the hospital after you are discharged, you can call the unit and asked to speak with the hospitalist on call if the hospitalist that took care of you is not available. Once you are discharged, your primary care physician will handle any further medical issues. Please note that NO REFILLS for any discharge medications will be authorized once you are discharged, as it is imperative that you return to your primary care physician (or establish a relationship with a primary care physician if you do not have one) for your aftercare needs so that they can reassess your need for medications and monitor your lab values.    Today   CHIEF  COMPLAINT:   Chief Complaint  Patient presents with  . Cough  . Chest Pain    HISTORY OF PRESENT ILLNESS:  Kimoria Ebarb  is a 68 y.o. female came in with chest pain and body aches and not feeling well.   VITAL SIGNS:  Blood pressure (!) 165/85, pulse 76, temperature 98.5 F (36.9 C), temperature source Oral, resp. rate 19, height 5\' 2"  (1.575 m), weight 77.3 kg, SpO2 97 %.  I/O:    Intake/Output Summary (Last 24 hours) at 03/09/2019 1530 Last data filed at 03/09/2019 1238 Gross per 24 hour  Intake 420.75 ml  Output 702 ml  Net -281.25 ml    PHYSICAL EXAMINATION:  GENERAL:  68 y.o.-year-old patient lying in the bed with no acute distress.  EYES: Pupils equal, round, reactive to light and accommodation. No scleral icterus. Extraocular muscles intact.  HEENT: Head atraumatic, normocephalic. Oropharynx and nasopharynx clear.  NECK:  Supple, no jugular venous distention. No thyroid enlargement, no tenderness.  LUNGS: Normal breath sounds bilaterally, no wheezing, rales,rhonchi or crepitation. No use of accessory muscles of respiration.  CARDIOVASCULAR: S1, S2 normal. No murmurs, rubs, or gallops.  ABDOMEN: Soft, non-tender, non-distended. Bowel sounds present. No organomegaly or mass.  EXTREMITIES: No pedal edema, cyanosis, or clubbing.  NEUROLOGIC: Cranial nerves II through XII are intact. Muscle strength 5/5 in all extremities. Sensation intact. Gait not checked.  PSYCHIATRIC: The patient is alert and oriented x 3.  SKIN: No obvious rash, lesion, or ulcer.   DATA REVIEW:   CBC Recent Labs  Lab 03/09/19 0433  WBC 7.6  HGB 10.4*  HCT 31.1*  PLT 198    Chemistries  Recent Labs  Lab 03/09/19 0433  NA 141  K 3.7  CL 110  CO2 26  GLUCOSE 177*  BUN 10  CREATININE 0.76  CALCIUM 8.3*     Microbiology Results  Results for orders placed or performed during the hospital encounter of 03/06/19  Respiratory Panel by RT PCR (Flu A&B, Covid) - Nasopharyngeal Swab      Status: None   Collection Time: 03/06/19  7:25 PM   Specimen: Nasopharyngeal Swab  Result Value Ref Range Status   SARS Coronavirus 2  by RT PCR NEGATIVE NEGATIVE Final    Comment: (NOTE) SARS-CoV-2 target nucleic acids are NOT DETECTED. The SARS-CoV-2 RNA is generally detectable in upper respiratoy specimens during the acute phase of infection. The lowest concentration of SARS-CoV-2 viral copies this assay can detect is 131 copies/mL. A negative result does not preclude SARS-Cov-2 infection and should not be used as the sole basis for treatment or other patient management decisions. A negative result may occur with  improper specimen collection/handling, submission of specimen other than nasopharyngeal swab, presence of viral mutation(s) within the areas targeted by this assay, and inadequate number of viral copies (<131 copies/mL). A negative result must be combined with clinical observations, patient history, and epidemiological information. The expected result is Negative. Fact Sheet for Patients:  PinkCheek.be Fact Sheet for Healthcare Providers:  GravelBags.it This test is not yet ap proved or cleared by the [place] FDA and  has been authorized for detection and/or diagnosis of SARS-CoV-2 by FDA under an Emergency Use Authorization (EUA). This EUA will remain  in effect (meaning this test can be used) for the duration of the COVID-19 declaration under Section 564(b)(1) of the Act, 21 U.S.C. section 360bbb-3(b)(1), unless the authorization is terminated or revoked sooner.    Influenza A by PCR NEGATIVE NEGATIVE Final   Influenza B by PCR NEGATIVE NEGATIVE Final    Comment: (NOTE) The Xpert Xpress SARS-CoV-2/FLU/RSV assay is intended as an aid in  the diagnosis of influenza from Nasopharyngeal swab specimens and  should not be used as a sole basis for treatment. Nasal washings and  aspirates are unacceptable for  Xpert Xpress SARS-CoV-2/FLU/RSV  testing. Fact Sheet for Patients: PinkCheek.be Fact Sheet for Healthcare Providers: GravelBags.it This test is not yet approved or cleared by the [place] FDA and  has been authorized for detection and/or diagnosis of SARS-CoV-2 by  FDA under an Emergency Use Authorization (EUA). This EUA will remain  in effect (meaning this test can be used) for the duration of the  Covid-19 declaration under Section 564(b)(1) of the Act, 21  U.S.C. section 360bbb-3(b)(1), unless the authorization is  terminated or revoked. Performed at [facility], [address] [place], [place] [address]     RADIOLOGY:  MR BRAIN WO CONTRAST  Result Date: 03/08/2019 CLINICAL DATA:  Generalized muscle weakness. Dizziness and unsteady gait beginning today. EXAM: MRI HEAD WITHOUT CONTRAST TECHNIQUE: Multiplanar, multiecho pulse sequences of the brain and surrounding structures were obtained without intravenous contrast. COMPARISON:  Head CT 10/29/2017. MRI 04/23/2016. FINDINGS: Brain: Diffusion imaging does not show any acute or subacute infarction. Minimal small vessel change affects the pons. Single old small vessel infarction in the right cerebellum. Cerebral hemispheres show a few old lacunar infarctions in the basal ganglia and moderate chronic small-vessel ischemic change the white matter, including a lesion of the posterior body of the corpus callosum. Findings are mildly progressive since 2018. No large vessel territory infarction. No mass lesion, hemorrhage, hydrocephalus or extra-axial collection Vascular: Major vessels at the base of the brain show flow. Skull and upper cervical spine: Negative Sinuses/Orbits: Clear/normal Other: None IMPRESSION: No acute or reversible finding. Moderate chronic small-vessel ischemic changes throughout the brain, mildly progressive since 2018. Electronically Signed   By: Nelson Chimes M.D.   On: 03/08/2019 15:25      Management plans discussed with the patient, family and they are in agreement.  CODE STATUS:     Code Status Orders  (From admission, onward)  Start     Ordered   03/06/19 2238  Full code  Continuous     03/06/19 2242        Code Status History    Date Active Date Inactive Code Status Order ID Comments User Context   09/19/2018 1352 09/19/2018 1830 Full Code [id number]  Corey Skains, MD Inpatient   03/27/2018 1632 03/28/2018 1840 Full Code [id number]  Dustin Flock, MD Inpatient   08/15/2017 2104 08/18/2017 1715 Full Code [id number]  Rubie Maid, MD Inpatient   12/23/2016 1722 12/24/2016 1639 Full Code [id number]  Gladstone Lighter, MD Inpatient   08/03/2016 2306 08/06/2016 1247 Full Code [id number]  Lance Coon, MD ED   07/26/2016 1639 07/27/2016 1811 Full Code [id number]  Demetrios Loll, MD Inpatient   11/07/2015 1711 11/10/2015 1552 Full Code [id number]  John Giovanni, PA-C Inpatient   11/05/2015 2155 11/07/2015 1646 Full Code [id number]  Henreitta Leber, MD Inpatient   Advance Care Planning Activity      TOTAL TIME TAKING CARE OF THIS PATIENT: 35 minutes.    Loletha Grayer M.D on 03/09/2019 at 3:30 PM  Between 7am to 6pm - Pager - [phone]  After 6pm go to [email or web address] - password [email or web address]  [facility]  CC: Primary care physician; [name]., MD

## 2019-03-12 ENCOUNTER — Encounter: Payer: Self-pay | Admitting: Family Medicine

## 2019-03-12 ENCOUNTER — Other Ambulatory Visit: Payer: Self-pay

## 2019-03-12 ENCOUNTER — Ambulatory Visit (INDEPENDENT_AMBULATORY_CARE_PROVIDER_SITE_OTHER): Payer: Medicare Other | Admitting: Family Medicine

## 2019-03-12 DIAGNOSIS — I5032 Chronic diastolic (congestive) heart failure: Secondary | ICD-10-CM

## 2019-03-12 DIAGNOSIS — E1169 Type 2 diabetes mellitus with other specified complication: Secondary | ICD-10-CM

## 2019-03-12 DIAGNOSIS — J209 Acute bronchitis, unspecified: Secondary | ICD-10-CM | POA: Diagnosis not present

## 2019-03-12 DIAGNOSIS — I480 Paroxysmal atrial fibrillation: Secondary | ICD-10-CM

## 2019-03-12 DIAGNOSIS — E785 Hyperlipidemia, unspecified: Secondary | ICD-10-CM

## 2019-03-12 NOTE — Progress Notes (Signed)
Candace [name]  MRN: [id number] DOB: Jul 07, 1950  Subjective:  HPI   The patient is a 68 year old female who presents via phone visit for hospital follow up.  The patient was admitted on 03/06/19 for chest pain, EKG abnormalities and cough.   The patient has been staying with her daughter since her discharge and both daughter and patient report improving cough of clear production.  She continues to have very little chest pain without shortness of breath. She has had 1 episode of diarrhea and continues with headaches and dizziness occasionally.    Virtual Visit via Telephone Note  I connected with Candace [name] on 03/12/19 at  9:40 AM EST by telephone and verified that I am speaking with the correct person using two identifiers.  Location: Patient: home Provider: Office   I discussed the limitations, risks, security and privacy concerns of performing an evaluation and management service by telephone and the availability of in person appointments. I also discussed with the patient that there may be a patient responsible charge related to this service. The patient expressed understanding and agreed to proceed.  Patient Active Problem List   Diagnosis Date Noted  . Vertigo   . Unsteady gait   . Acute bronchitis 03/07/2019  . Esophageal dysphagia   . Stomach irritation   . Gastric polyp   . Esophageal lump   . Hx of colonic polyp   . Polyp of colon   . Diverticulosis of large intestine without diverticulitis   . PSVT (paroxysmal supraventricular tachycardia) ([place]) 10/03/2018  . Abnormal ECG 07/05/2018  . Tachycardia 03/27/2018  . Pain in limb 02/28/2018  . Chronic diastolic CHF (congestive heart failure) ([place]) 11/03/2017  . TIA (transient ischemic attack) 11/03/2017  . COPD suggested by initial evaluation ([place]) 08/23/2017  . Acute and chronic respiratory failure with hypoxia ([place]) 08/23/2017  . Postoperative state 08/15/2017  . Incidental pulmonary nodule 08/01/2017  . Non-ST  elevation myocardial infarction (NSTEMI), subendocardial infarction, subsequent episode of care ([place]) 06/01/2017  . B12 deficiency 12/01/2016  . Vitamin D deficiency 11/04/2016  . Depression with anxiety 09/30/2016  . Paroxysmal A-fib ([place]) 09/30/2016  . Resting tremor 09/30/2016  . Mild obstructive sleep apnea 09/30/2016  . Headache 08/18/2016  . Unstable angina ([place]) 08/03/2016  . Type 2 diabetes mellitus with hyperlipidemia ([place]) 08/03/2016  . Asthma 11/11/2015  . S/P CABG x 4 11/10/2015  . Coronary artery disease 11/07/2015  . Carotid artery narrowing 02/08/2014  . Chest pain 02/08/2014  . Mixed hyperlipidemia 02/08/2014  . Atypical chest pain 02/07/2014  . Essential (primary) hypertension 02/07/2014  . Awareness of heartbeats 02/07/2014  . D (diarrhea) 10/05/2013  . Acid reflux 10/05/2013    Past Medical History:  Diagnosis Date  . Allergy   . Anemia   . Anxiety   . Arrhythmia   . Arthritis   . Coronary artery disease   . Depression   . Diabetes mellitus without complication ([place])   . Dyspnea    doe  . Dysrhythmia   . GERD (gastroesophageal reflux disease)   . Headache   . History of hiatal hernia   . Hyperlipidemia   . Hypertension   . Myocardial infarction ([place])    2016, 04/2017  . Myocardial infarction with cardiac rehabilitation [facility])    MI 2016/ CABG 8/17    FINISHED CARDIAC REHAB 3 WEEKS AGO  . Panic attack   . Pneumonia   . Reflux   . Stroke [facility]) 2015  showed up on MRI; no weakness noted  . TIA (transient ischemic attack)   . Voice tremor    Past Surgical History:  Procedure Laterality Date  . ABDOMINAL HYSTERECTOMY    . APPENDECTOMY  1975  . ARTERY BIOPSY Right 04/26/2016   Procedure: BIOPSY TEMPORAL ARTERY;  Surgeon: Margaretha Sheffield, MD;  Location: [facility] ORS;  Service: ENT;  Laterality: Right;  . CARDIAC CATHETERIZATION N/A 11/06/2015   Procedure: Left Heart Cath and Coronary Angiography;  Surgeon: Corey Skains, MD;  Location: [place] [facility]  LAB;  Service: Cardiovascular;  Laterality: N/A;  . CESAREAN SECTION    . COLONOSCOPY  2015  . COLONOSCOPY WITH PROPOFOL N/A 11/03/2018   Procedure: COLONOSCOPY WITH PROPOFOL;  Surgeon: Virgel Manifold, MD;  Location: [facility] ENDOSCOPY;  Service: Endoscopy;  Laterality: N/A;  . CORONARY ANGIOPLASTY  04/2017   [place]  . CORONARY ARTERY BYPASS GRAFT N/A 11/10/2015   Procedure: CORONARY ARTERY BYPASS GRAFTING (CABG), ON PUMP, TIMES FOUR, USING LEFT INTERNAL MAMMARY ARTERY, BILATERAL GREATER SAPHENOUS VEINS HARVESTED ENDOSCOPICALLY;  Surgeon: [name], MD;  Location: [place];  Service: Open Heart Surgery;  Laterality: N/A;  LIMA-LAD; SEQ SVG-OM1-OM2; SVG-PL  . CORONARY STENT INTERVENTION N/A 08/05/2016   Procedure: Coronary Stent Intervention;  Surgeon: Isaias Cowman, MD;  Location: [place] [facility];  Service: Cardiovascular;  Laterality: N/A;  . ESOPHAGOGASTRODUODENOSCOPY (EGD) WITH PROPOFOL N/A 11/03/2018   Procedure: ESOPHAGOGASTRODUODENOSCOPY (EGD) WITH PROPOFOL;  Surgeon: Virgel Manifold, MD;  Location: [facility] ENDOSCOPY;  Service: Endoscopy;  Laterality: N/A;  . HYSTERECTOMY ABDOMINAL WITH SALPINGO-OOPHORECTOMY Bilateral 08/15/2017   Procedure: HYSTERECTOMY ABDOMINAL WITH BILATERAL SALPINGO-OOPHORECTOMY;  Surgeon: Rubie Maid, MD;  Location: [facility] ORS;  Service: Gynecology;  Laterality: Bilateral;  . LEFT HEART CATH AND CORONARY ANGIOGRAPHY N/A 08/05/2016   Procedure: Left Heart Cath and Coronary Angiography;  Surgeon: Isaias Cowman, MD;  Location: [place] [facility];  Service: Cardiovascular;  Laterality: N/A;  . LEFT HEART CATH AND CORS/GRAFTS ANGIOGRAPHY N/A 09/19/2018   Procedure: LEFT HEART CATH AND CORS/GRAFTS ANGIOGRAPHY;  Surgeon: Corey Skains, MD;  Location: [place] [facility];  Service: Cardiovascular;  Laterality: N/A;  . OOPHORECTOMY    . TEE WITHOUT CARDIOVERSION N/A 11/10/2015   Procedure: TRANSESOPHAGEAL ECHOCARDIOGRAM (TEE);   Surgeon: [name], MD;  Location: [place];  Service: Open Heart Surgery;  Laterality: N/A;  . TUBAL LIGATION     Family History  Problem Relation Age of Onset  . Cancer Father   . Hypertension Father   . Heart disease Father   . Asthma Father   . Cancer Mother   . Hypertension Mother   . Pancreatic cancer Mother 42  . Cancer Sister   . Breast cancer Sister 27  . Breast cancer Sister 50  . Lung cancer Brother   . Pancreatic cancer Sister 39  . Cancer Sister    Social History   Socioeconomic History  . Marital status: Divorced    Spouse name: Not on file  . Number of children: 3  . Years of education: Not on file  . Highest education level: Some college, no degree  Occupational History  . Occupation: retired  Tobacco Use  . Smoking status: Former Smoker    Packs/day: 0.50    Types: Cigarettes    Quit date: 10/07/2001    Years since quitting: 17.4  . Smokeless tobacco: Never Used  Substance and Sexual Activity  . Alcohol use: No    Alcohol/week: 0.0 standard drinks  .  Drug use: No  . Sexual activity: Not Currently  Other Topics Concern  . Not on file  Social History Narrative   Lives at home alone   Social Determinants of Health   Financial Resource Strain: Low Risk   . Difficulty of Paying Living Expenses: Not hard at all  Food Insecurity: No Food Insecurity  . Worried About Charity fundraiser in the Last Year: Never true  . Ran Out of Food in the Last Year: Never true  Transportation Needs: No Transportation Needs  . Lack of Transportation (Medical): No  . Lack of Transportation (Non-Medical): No  Physical Activity: Inactive  . Days of Exercise per Week: 0 days  . Minutes of Exercise per Session: 0 min  Stress: No Stress Concern Present  . Feeling of Stress : Not at all  Social Connections: Unknown  . Frequency of Communication with Friends and Family: Patient refused  . Frequency of Social Gatherings with Friends and Family: Patient refused  .  Attends Religious Services: Patient refused  . Active Member of Clubs or Organizations: Patient refused  . Attends Archivist Meetings: Patient refused  . Marital Status: Patient refused  Intimate Partner Violence: Unknown  . Fear of Current or Ex-Partner: Patient refused  . Emotionally Abused: Patient refused  . Physically Abused: Patient refused  . Sexually Abused: Patient refused    Outpatient Encounter Medications as of 03/12/2019  Medication Sig  . acetaminophen (TYLENOL) 325 MG tablet Take 650 mg by mouth every 6 (six) hours as needed for moderate pain or headache.  Marland Kitchen atorvastatin (LIPITOR) 80 MG tablet Take 80 mg by mouth every evening.   . [facility] ASPIRIN ADULT LOW DOSE 81 MG chewable tablet CHEW 1 TABLET (81 MG TOTAL) BY MOUTH DAILY. (Patient taking differently: Chew 81 mg by mouth daily. )  . famotidine (PEPCID) 40 MG tablet Take 40 mg by mouth daily.   . furosemide (LASIX) 20 MG tablet Take 20 mg by mouth every other day.   . Ipratropium-Albuterol (COMBIVENT RESPIMAT) 20-100 MCG/ACT AERS respimat Inhale 1 puff into the lungs every 6 (six) hours as needed for wheezing or shortness of breath.  . isosorbide mononitrate (IMDUR) 60 MG 24 hr tablet Take 1 tablet (60 mg total) by mouth daily.  Marland Kitchen loperamide (IMODIUM) 2 MG capsule Take 2-4 mg by mouth as needed for diarrhea or loose stools.   . meclizine (ANTIVERT) 12.5 MG tablet Take 1 tablet (12.5 mg total) by mouth 3 (three) times daily as needed for dizziness.  . metFORMIN (GLUCOPHAGE) 1000 MG tablet Take 1 tablet (1,000 mg total) by mouth 2 (two) times daily with a meal.  . metoprolol tartrate (LOPRESSOR) 100 MG tablet Take 100 mg by mouth 2 (two) times daily.  . nitroGLYCERIN (NITROSTAT) 0.4 MG SL tablet Place 1 tablet (0.4 mg total) under the tongue every 5 (five) minutes as needed for chest pain.  Marland Kitchen ondansetron (ZOFRAN-ODT) 8 MG disintegrating tablet DISSOLVE 1 TABLET ON THE TONGUE EVERY 8 HOURS AS NEEDED FOR NAUSEA OR  VOMITING  . pantoprazole (PROTONIX) 20 MG tablet Take 1 tablet (20 mg total) by mouth 2 (two) times daily.  . Semaglutide,0.25 or 0.5MG /DOS, (OZEMPIC, 0.25 OR 0.5 MG/DOSE,) 2 MG/1.5ML SOPN Inject 0.25 mg into the skin once a week.  . [DISCONTINUED] cetirizine (ZYRTEC) 5 MG tablet Take 2 tablets (10 mg total) by mouth daily.   No facility-administered encounter medications on file as of 03/12/2019.    Allergies  Allergen Reactions  .  Lisinopril Cough  . Penicillins Swelling, Rash and Other (See Comments)    Did it involve swelling of the face/tongue/throat, SOB, or low BP? Yes Did it involve sudden or severe rash/hives, skin peeling, or any reaction on the inside of your mouth or nose? Yes Did you need to seek medical attention at a hospital or doctor's office? Yes When did it last happen?15 years If all above answers are "NO", may proceed with cephalosporin use.     Review of Systems  Constitutional: Negative for chills, diaphoresis, fever and malaise/fatigue.  HENT: Positive for congestion (head and chest), sinus pain and sore throat. Negative for ear pain, hearing loss and tinnitus.   Respiratory: Positive for cough, sputum production (clear) and wheezing. Negative for shortness of breath.   Cardiovascular: Positive for chest pain and palpitations. Negative for orthopnea and leg swelling.  Gastrointestinal: Positive for diarrhea. Negative for abdominal pain and blood in stool.  Musculoskeletal: Positive for myalgias.  Neurological: Positive for dizziness and headaches.    Objective:  There were no vitals taken for this visit.  Physical Exam: Minimal cough during telephonic interview. No apparent distress.  Assessment and Plan :   1. Acute bronchitis, unspecified organism This 68 year old female was hospitalized on 03-06-19 through 03-09-19 for chest pain with acute bronchitis and right sided wheeze. Was treated with steroid and antibiotic with negative cardiac enzymes, no  pneumonia on CXR and improvement in dizziness with use of Meclizine. States she is better with only a little cough early AM and late PM. Combivent q 6 hrs and Prednisone with Z-pak at discharge continues to improve symptoms. Recommend she continue to quarantine at home (although her COVID test was negative), drink extra fluids and eat 3 meals a day to maintain diabetes control. Recheck in the office as needed and not to go back to work before cardiology evaluation on 03-20-19.   2. Type 2 diabetes mellitus with hyperlipidemia (HCC) Poor control of diabetes prior to hospitalization. BS 151 at discharge and on Ozempic 0.25 mg injection weekly now. Hgb A1C was down to 9.5 on 03-06-19 from 11.4 two months ago. Will continue present regimen and encouraged to get back on a diabetic diet. Recheck as planned on 05-14-19 with Dr. Rosanna Randy.   3. PAF (paroxysmal atrial fibrillation) (HCC) Denies palpitations. Presently on Metoprolol Tartrate 100 mg BID with ASA 325 mg qd and keep follow up appointment with Dr. Nehemiah Massed (cardiologist) on 03-20-19 to assess for need of further testing.  4. Chronic diastolic CHF (congestive heart failure) (HCC) Echocardiogram on 03-07-19 showed EF 55-60% with left ventricular hypertrophy. Denies significant dyspnea or edema, today. No significant chest discomfort or palpitations. Has restarted the Lasix 20 mg qod as advised during her hospitalization. Follow up with cardiologist as planned above.   I discussed the assessment and treatment plan with the patient. The patient was provided an opportunity to ask questions and all were answered. The patient agreed with the plan and demonstrated an understanding of the instructions.   The patient was advised to call back or seek an in-person evaluation if the symptoms worsen or if the condition fails to improve as anticipated.  I provided 17 minutes of non-face-to-face time during this encounter.

## 2019-03-15 ENCOUNTER — Telehealth: Payer: Self-pay | Admitting: Family Medicine

## 2019-03-15 NOTE — Telephone Encounter (Signed)
Please advise 

## 2019-03-15 NOTE — Telephone Encounter (Signed)
Spoke with patient this morning to get her AWV scheduled and she requested for Dr.Gilbert to recommend vitamins for her to take. Patient states she stays tired all of the time. Please call.

## 2019-03-15 NOTE — Telephone Encounter (Signed)
B12,Vit D

## 2019-03-16 NOTE — Telephone Encounter (Signed)
Patient was advised. Expressed understanding.  

## 2019-03-26 ENCOUNTER — Telehealth: Payer: Self-pay | Admitting: Family Medicine

## 2019-03-26 NOTE — Telephone Encounter (Signed)
Pt states that the office sent her to Urgent Care and then she was sent to the hospital where she was admitted for acute bronchotitis.  Pt states that she still can't get rid of the cough and would like to know if anything can be called in for her. Pt states that OTC medication isn't working for her. Pt uses  [id number] [place], [place] - [address] Phone:  [phone]  Fax:  [phone]

## 2019-03-26 NOTE — Progress Notes (Signed)
Subjective:   SHANDOLYN CUNDARI is a 68 y.o. female who presents for Medicare Annual (Subsequent) preventive examination.    This visit is being conducted through telemedicine due to the COVID-19 pandemic. This patient has given me verbal consent via doximity to conduct this visit, patient states they are participating from their home address. Some vital signs may be absent or patient reported.    Patient identification: identified by name, DOB, and current address  Review of Systems:  N/A  Cardiac Risk Factors include: advanced age (>35men, >9 women);dyslipidemia;hypertension     Objective:     Vitals: There were no vitals taken for this visit.  There is no height or weight on file to calculate BMI. Unable to obtain vitals due to visit being conducted via telephonically.   Advanced Directives 03/27/2019 03/07/2019 03/06/2019 12/07/2018 11/03/2018 09/19/2018 04/24/2018  Does Patient Have a Medical Advance Directive? Yes No No Yes Yes Yes No  Type of Paramedic of [place];Living will - - [place];Living will Living will - -  Does patient want to make changes to medical advance directive? - - - No - Patient declined - No - Patient declined -  Copy of [place] in Chart? No - copy requested - - No - copy requested - - -  Would patient like information on creating a medical advance directive? - No - Patient declined - - - - -    Tobacco Social History   Tobacco Use  Smoking Status Former Smoker  . Packs/day: 0.50  . Types: Cigarettes  . Quit date: 10/07/2001  . Years since quitting: 17.4  Smokeless Tobacco Never Used     Counseling given: Not Answered   Clinical Intake:  Pre-visit preparation completed: Yes  Pain : No/denies pain Pain Score: 0-No pain     Nutritional Risks: None Diabetes: Yes  How often do you need to have someone help you when you read instructions, pamphlets, or other written materials from  your doctor or pharmacy?: 1 - Never   Diabetes:  Is the patient diabetic?  Yes type 2 If diabetic, was a CBG obtained today?  No  Did the patient bring in their glucometer from home?  No  How often do you monitor your CBG's? Once a day.   Financial Strains and Diabetes Management:  Are you having any financial strains with the device, your supplies or your medication? No .  Does the patient want to be seen by Chronic Care Management for management of their diabetes?  No  Would the patient like to be referred to a Nutritionist or for Diabetic Management?  No   Diabetic Exams:  Diabetic Eye Exam: Completed in April or May of this year at [facility]. Requested records to be sent to clinic.   Diabetic Foot Exam: Completed 01/09/19. Repeat yearly.     Interpreter Needed?: No  Information entered by :: [facility], LPN  Past Medical History:  Diagnosis Date  . Allergy   . Anemia   . Anxiety   . Arrhythmia   . Arthritis   . Coronary artery disease   . Depression   . Diabetes mellitus without complication ([place])   . Dyspnea    doe  . Dysrhythmia   . GERD (gastroesophageal reflux disease)   . Headache   . History of hiatal hernia   . Hyperlipidemia   . Hypertension   . Myocardial infarction ([place])    2016, 04/2017  .  Myocardial infarction with cardiac rehabilitation [facility])    MI 2016/ CABG 8/17    FINISHED CARDIAC REHAB 3 WEEKS AGO  . Panic attack   . Pneumonia   . Reflux   . Stroke [facility]) 2015   showed up on MRI; no weakness noted  . TIA (transient ischemic attack)   . Voice tremor    Past Surgical History:  Procedure Laterality Date  . ABDOMINAL HYSTERECTOMY    . APPENDECTOMY  1975  . ARTERY BIOPSY Right 04/26/2016   Procedure: BIOPSY TEMPORAL ARTERY;  Surgeon: Margaretha Sheffield, MD;  Location: [facility] ORS;  Service: ENT;  Laterality: Right;  . CARDIAC CATHETERIZATION N/A 11/06/2015   Procedure: Left Heart Cath and Coronary Angiography;  Surgeon: Corey Skains, MD;   Location: [place] CV LAB;  Service: Cardiovascular;  Laterality: N/A;  . CESAREAN SECTION    . COLONOSCOPY  2015  . COLONOSCOPY WITH PROPOFOL N/A 11/03/2018   Procedure: COLONOSCOPY WITH PROPOFOL;  Surgeon: Virgel Manifold, MD;  Location: [facility] ENDOSCOPY;  Service: Endoscopy;  Laterality: N/A;  . CORONARY ANGIOPLASTY  04/2017   [place]  . CORONARY ARTERY BYPASS GRAFT N/A 11/10/2015   Procedure: CORONARY ARTERY BYPASS GRAFTING (CABG), ON PUMP, TIMES FOUR, USING LEFT INTERNAL MAMMARY ARTERY, BILATERAL GREATER SAPHENOUS VEINS HARVESTED ENDOSCOPICALLY;  Surgeon: [name], MD;  Location: [place];  Service: Open Heart Surgery;  Laterality: N/A;  LIMA-LAD; SEQ SVG-OM1-OM2; SVG-PL  . CORONARY STENT INTERVENTION N/A 08/05/2016   Procedure: Coronary Stent Intervention;  Surgeon: Isaias Cowman, MD;  Location: [place] CV LAB;  Service: Cardiovascular;  Laterality: N/A;  . ESOPHAGOGASTRODUODENOSCOPY (EGD) WITH PROPOFOL N/A 11/03/2018   Procedure: ESOPHAGOGASTRODUODENOSCOPY (EGD) WITH PROPOFOL;  Surgeon: Virgel Manifold, MD;  Location: [facility] ENDOSCOPY;  Service: Endoscopy;  Laterality: N/A;  . HYSTERECTOMY ABDOMINAL WITH SALPINGO-OOPHORECTOMY Bilateral 08/15/2017   Procedure: HYSTERECTOMY ABDOMINAL WITH BILATERAL SALPINGO-OOPHORECTOMY;  Surgeon: Rubie Maid, MD;  Location: [facility] ORS;  Service: Gynecology;  Laterality: Bilateral;  . LEFT HEART CATH AND CORONARY ANGIOGRAPHY N/A 08/05/2016   Procedure: Left Heart Cath and Coronary Angiography;  Surgeon: Isaias Cowman, MD;  Location: [place] CV LAB;  Service: Cardiovascular;  Laterality: N/A;  . LEFT HEART CATH AND CORS/GRAFTS ANGIOGRAPHY N/A 09/19/2018   Procedure: LEFT HEART CATH AND CORS/GRAFTS ANGIOGRAPHY;  Surgeon: Corey Skains, MD;  Location: [place] CV LAB;  Service: Cardiovascular;  Laterality: N/A;  . OOPHORECTOMY    . TEE WITHOUT CARDIOVERSION N/A 11/10/2015   Procedure: TRANSESOPHAGEAL  ECHOCARDIOGRAM (TEE);  Surgeon: [name], MD;  Location: [place];  Service: Open Heart Surgery;  Laterality: N/A;  . TUBAL LIGATION     Family History  Problem Relation Age of Onset  . Cancer Father   . Hypertension Father   . Heart disease Father   . Asthma Father   . Cancer Mother   . Hypertension Mother   . Pancreatic cancer Mother 40  . Cancer Sister   . Breast cancer Sister 16  . Breast cancer Sister 6  . Lung cancer Brother   . Pancreatic cancer Sister 75  . Cancer Sister    Social History   Socioeconomic History  . Marital status: Divorced    Spouse name: Not on file  . Number of children: 3  . Years of education: Not on file  . Highest education level: Some college, no degree  Occupational History  . Occupation: retired  Tobacco Use  . Smoking status: Former Smoker    Packs/day:  0.50    Types: Cigarettes    Quit date: 10/07/2001    Years since quitting: 17.4  . Smokeless tobacco: Never Used  Substance and Sexual Activity  . Alcohol use: No    Alcohol/week: 0.0 standard drinks  . Drug use: No  . Sexual activity: Not Currently  Other Topics Concern  . Not on file  Social History Narrative   Lives at home alone   Social Determinants of Health   Financial Resource Strain: Low Risk   . Difficulty of Paying Living Expenses: Not hard at all  Food Insecurity: No Food Insecurity  . Worried About Charity fundraiser in the Last Year: Never true  . Ran Out of Food in the Last Year: Never true  Transportation Needs: No Transportation Needs  . Lack of Transportation (Medical): No  . Lack of Transportation (Non-Medical): No  Physical Activity: Inactive  . Days of Exercise per Week: 0 days  . Minutes of Exercise per Session: 0 min  Stress: No Stress Concern Present  . Feeling of Stress : Not at all  Social Connections: Somewhat Isolated  . Frequency of Communication with Friends and Family: Three times a week  . Frequency of Social Gatherings with  Friends and Family: Twice a week  . Attends Religious Services: More than 4 times per year  . Active Member of Clubs or Organizations: No  . Attends Archivist Meetings: Never  . Marital Status: Divorced    Outpatient Encounter Medications as of 03/27/2019  Medication Sig  . acetaminophen (TYLENOL) 325 MG tablet Take 650 mg by mouth every 6 (six) hours as needed for moderate pain or headache.  Marland Kitchen atorvastatin (LIPITOR) 80 MG tablet Take 80 mg by mouth every evening.   . [employer] ASPIRIN ADULT LOW DOSE 81 MG chewable tablet CHEW 1 TABLET (81 MG TOTAL) BY MOUTH DAILY. (Patient taking differently: Chew 81 mg by mouth daily. )  . famotidine (PEPCID) 40 MG tablet Take 40 mg by mouth daily.   . furosemide (LASIX) 20 MG tablet Take 20 mg by mouth every other day.   . Ipratropium-Albuterol (COMBIVENT RESPIMAT) 20-100 MCG/ACT AERS respimat Inhale 1 puff into the lungs every 6 (six) hours as needed for wheezing or shortness of breath.  . isosorbide mononitrate (IMDUR) 60 MG 24 hr tablet Take 1 tablet (60 mg total) by mouth daily.  Marland Kitchen loperamide (IMODIUM) 2 MG capsule Take 2-4 mg by mouth as needed for diarrhea or loose stools.   . meclizine (ANTIVERT) 12.5 MG tablet Take 1 tablet (12.5 mg total) by mouth 3 (three) times daily as needed for dizziness.  . metFORMIN (GLUCOPHAGE) 1000 MG tablet Take 1 tablet (1,000 mg total) by mouth 2 (two) times daily with a meal.  . metoprolol tartrate (LOPRESSOR) 100 MG tablet Take 100 mg by mouth 2 (two) times daily.  . nitroGLYCERIN (NITROSTAT) 0.4 MG SL tablet Place 1 tablet (0.4 mg total) under the tongue every 5 (five) minutes as needed for chest pain.  Marland Kitchen ondansetron (ZOFRAN-ODT) 8 MG disintegrating tablet DISSOLVE 1 TABLET ON THE TONGUE EVERY 8 HOURS AS NEEDED FOR NAUSEA OR VOMITING  . pantoprazole (PROTONIX) 20 MG tablet Take 1 tablet (20 mg total) by mouth 2 (two) times daily.  . Semaglutide,0.25 or 0.5MG /DOS, (OZEMPIC, 0.25 OR 0.5 MG/DOSE,) 2 MG/1.5ML SOPN  Inject 0.25 mg into the skin once a week.  . [DISCONTINUED] cetirizine (ZYRTEC) 5 MG tablet Take 2 tablets (10 mg total) by mouth daily.   No  facility-administered encounter medications on file as of 03/27/2019.    Activities of Daily Living In your present state of health, do you have any difficulty performing the following activities: 03/27/2019 03/07/2019  Hearing? N -  Vision? N -  Difficulty concentrating or making decisions? N -  Walking or climbing stairs? Y -  Comment Due to SOB. -  Dressing or bathing? N -  Doing errands, shopping? N Y  Conservation officer, nature and eating ? N -  Using the Toilet? N -  In the past six months, have you accidently leaked urine? N -  Do you have problems with loss of bowel control? N -  Managing your Medications? N -  Managing your Finances? N -  Housekeeping or managing your Housekeeping? N -  Some recent data might be hidden    Patient Care Team: [name]., MD as PCP - General (Family Medicine) Corey Skains, MD as Consulting Physician (Internal Medicine) Kris Hartmann, NP as Nurse Practitioner (Vascular Surgery) Sharlotte Alamo, DPM (Podiatry)    Assessment:   This is a routine wellness examination for Mileydi.  Exercise Activities and Dietary recommendations Current Exercise Habits: The patient does not participate in regular exercise at present, Exercise limited by: respiratory conditions(s)  Goals    . DIET - INCREASE WATER INTAKE     Recommend to drink at least 6-8 8oz glasses of water per day.        Fall Risk: Fall Risk  03/27/2019 03/16/2018 11/24/2017 06/02/2017 06/22/2016  Falls in the past year? 0 0 No Yes Yes  Number falls in past yr: 0 0 - 2 or more 2 or more  Injury with Fall? 0 0 - No No  Risk Factor Category  - - - - High Fall Risk  Risk for fall due to : - - Impaired balance/gait;Impaired mobility History of fall(s) -  Follow up - - - Education provided;Falls prevention discussed Falls evaluation completed     FALL RISK PREVENTION PERTAINING TO THE HOME:  Any stairs in or around the home? No  If so, are there any without handrails? N/A  Home free of loose throw rugs in walkways, pet beds, electrical cords, etc? Yes  Adequate lighting in your home to reduce risk of falls? Yes   ASSISTIVE DEVICES UTILIZED TO PREVENT FALLS:  Life alert? No  Use of a cane, walker or w/c? Yes  Grab bars in the bathroom? Yes  Shower chair or bench in shower? Yes  Elevated toilet seat or a handicapped toilet? No   TIMED UP AND GO:  Was the test performed? No .    Depression Screen PHQ 2/9 Scores 03/27/2019 03/27/2019 03/16/2018 01/26/2018  PHQ - 2 Score 0 0 2 1  PHQ- 9 Score - - 4 5     Cognitive Function MMSE - Mini Mental State Exam 04/19/2016  Orientation to time 5  Orientation to Place 5  Registration 3  Attention/ Calculation 4  Recall 3  Language- name 2 objects 2  Language- repeat 1  Language- follow 3 step command 3  Language- read & follow direction 1  Write a sentence 1  Copy design 1  Total score 29     6CIT Screen 03/27/2019 03/16/2018  What Year? 0 points 0 points  What month? 0 points 0 points  What time? 0 points 0 points  Count back from 20 0 points 0 points  Months in reverse 0 points 0 points  Repeat phrase 0 points  0 points  Total Score 0 0    Immunization History  Administered Date(s) Administered  . Fluad Quad(high Dose 65+) 12/28/2018  . Influenza, High Dose Seasonal PF 12/24/2016, 12/08/2017  . Influenza,inj,Quad PF,6+ Mos 12/19/2015  . Influenza-Unspecified 03/06/2015  . Pneumococcal Conjugate-13 03/16/2018  . Pneumococcal Polysaccharide-23 12/24/2016  . Tdap 07/28/2010    Qualifies for Shingles Vaccine? Yes . Due for Shingrix. Pt has been advised to call insurance company to determine out of pocket expense. Advised may also receive vaccine at local pharmacy or Health Dept. Verbalized acceptance and understanding.  Tdap: Up to date  Flu Vaccine: Up  to date  Pneumococcal Vaccine: Completed series  Screening Tests Health Maintenance  Topic Date Due  . URINE MICROALBUMIN  06/08/1960  . OPHTHALMOLOGY EXAM  06/17/2017  . HEMOGLOBIN A1C  09/04/2019  . MAMMOGRAM  12/31/2019  . FOOT EXAM  01/09/2020  . TETANUS/TDAP  07/27/2020  . DEXA SCAN  05/11/2023  . COLONOSCOPY  11/03/2023  . INFLUENZA VACCINE  Completed  . Hepatitis C Screening  Completed  . PNA vac Low Risk Adult  Completed    Cancer Screenings:  Colorectal Screening: Completed 11/03/18. Repeat every 5 years.   Mammogram: Completed 12/30/17. Repeat every year 1-2 years as advised.   Bone Density: Completed 05/10/18. Results reflect OSTEOPENIA. Repeat every 5 years.   Lung Cancer Screening: (Low Dose CT Chest recommended if Age 53-80 years, 30 pack-year currently smoking OR have quit w/in 15years.) does not qualify.   Additional Screening:  Hepatitis C Screening: Up to date  Dental Screening: Recommended annual dental exams for proper oral hygiene   Community Resource Referral:  CRR required this visit?  No       Plan:  I have personally reviewed and addressed the Medicare Annual Wellness questionnaire and have noted the following in the patient's chart:  A. Medical and social history B. Use of alcohol, tobacco or illicit drugs  C. Current medications and supplements D. Functional ability and status E.  Nutritional status F.  Physical activity G. Advance directives H. List of other physicians I.  Hospitalizations, surgeries, and ER visits in previous 12 months J.  [place] such as hearing and vision if needed, cognitive and depression L. Referrals and appointments   In addition, I have reviewed and discussed with patient certain preventive protocols, quality metrics, and best practice recommendations. A written personalized care plan for preventive services as well as general preventive health recommendations were provided to patient.   Glendora Score, [place]  [phone] Nurse Health Advisor   Nurse Notes: Pt needs a urine check at next in office visit. Pt to retrieve previous eye exam records to update chart.

## 2019-03-27 ENCOUNTER — Ambulatory Visit (INDEPENDENT_AMBULATORY_CARE_PROVIDER_SITE_OTHER): Payer: Medicare Other

## 2019-03-27 ENCOUNTER — Other Ambulatory Visit: Payer: Self-pay

## 2019-03-27 ENCOUNTER — Telehealth: Payer: Self-pay | Admitting: Family Medicine

## 2019-03-27 DIAGNOSIS — Z0000 Encounter for general adult medical examination without abnormal findings: Secondary | ICD-10-CM

## 2019-03-27 MED ORDER — BENZONATATE 200 MG PO CAPS: 200.0000 mg | ORAL_CAPSULE | Freq: Three times a day (TID) | ORAL | 2 refills | Status: DC | PRN

## 2019-03-27 NOTE — Telephone Encounter (Signed)
Spoke with patient and informed her of her medication that was sent to the pharmacy. She gave verbal understanding.

## 2019-03-27 NOTE — Telephone Encounter (Signed)
Tessalon perles 200mg  TID prn cough,#30,2rf.

## 2019-03-27 NOTE — Telephone Encounter (Signed)
Patient calling back (2nd time) wanting to know if a cough medicine is going to be called in for her.  Was in the hospital for bronchitis.  Please call patient and let her know.

## 2019-03-27 NOTE — Telephone Encounter (Signed)
See message from yesterday. Please advise?

## 2019-03-27 NOTE — Patient Instructions (Signed)
Candace [name] , Thank you for taking time to come for your Medicare Wellness Visit. I appreciate your ongoing commitment to your health goals. Please review the following plan we discussed and let me know if I can assist you in the future.   Screening recommendations/referrals: Colonoscopy: Up to date, due 10/2023 Mammogram: Up to date, due 12/2019 Bone Density: Up to date, due 04/2023 Recommended yearly ophthalmology/optometry visit for glaucoma screening and checkup Recommended yearly dental visit for hygiene and checkup  Vaccinations: Influenza vaccine: Up to date Pneumococcal vaccine: Completed series Tdap vaccine: Up to date, due 09/2024 Shingles vaccine: Pt declines today.     Advanced directives: Please bring a copy of your POA (Power of Attorney) and/or Living Will to your next appointment.   Conditions/risks identified: Recommend to increase water intake to 6-8 8 oz glasses a day.   Next appointment: 05/14/19 @ 8:00 AM with Dr Rosanna Randy. Declined scheduling a repeat AWV for next year, at this time.    Preventive Care 18 Years and Older, Female Preventive care refers to lifestyle choices and visits with your health care provider that can promote health and wellness. What does preventive care include?  A yearly physical exam. This is also called an annual well check.  Dental exams once or twice a year.  Routine eye exams. Ask your health care provider how often you should have your eyes checked.  Personal lifestyle choices, including:  Daily care of your teeth and gums.  Regular physical activity.  Eating a healthy diet.  Avoiding tobacco and drug use.  Limiting alcohol use.  Practicing safe sex.  Taking low-dose aspirin every day.  Taking vitamin and mineral supplements as recommended by your health care provider. What happens during an annual well check? The services and screenings done by your health care provider during your annual well check will depend on  your age, overall health, lifestyle risk factors, and family history of disease. Counseling  Your health care provider may ask you questions about your:  Alcohol use.  Tobacco use.  Drug use.  Emotional well-being.  Home and relationship well-being.  Sexual activity.  Eating habits.  History of falls.  Memory and ability to understand (cognition).  Work and work Statistician.  Reproductive health. Screening  You may have the following tests or measurements:  Height, weight, and BMI.  Blood pressure.  Lipid and cholesterol levels. These may be checked every 5 years, or more frequently if you are over 66 years old.  Skin check.  Lung cancer screening. You may have this screening every year starting at age 45 if you have a 30-pack-year history of smoking and currently smoke or have quit within the past 15 years.  Fecal occult blood test (FOBT) of the stool. You may have this test every year starting at age 50.  Flexible sigmoidoscopy or colonoscopy. You may have a sigmoidoscopy every 5 years or a colonoscopy every 10 years starting at age 14.  Hepatitis C blood test.  Hepatitis B blood test.  Sexually transmitted disease (STD) testing.  Diabetes screening. This is done by checking your blood sugar (glucose) after you have not eaten for a while (fasting). You may have this done every 1-3 years.  Bone density scan. This is done to screen for osteoporosis. You may have this done starting at age 38.  Mammogram. This may be done every 1-2 years. Talk to your health care provider about how often you should have regular mammograms. Talk with your health care provider  about your test results, treatment options, and if necessary, the need for more tests. Vaccines  Your health care provider may recommend certain vaccines, such as:  Influenza vaccine. This is recommended every year.  Tetanus, diphtheria, and acellular pertussis (Tdap, Td) vaccine. You may need a Td booster  every 10 years.  Zoster vaccine. You may need this after age 38.  Pneumococcal 13-valent conjugate (PCV13) vaccine. One dose is recommended after age 35.  Pneumococcal polysaccharide (PPSV23) vaccine. One dose is recommended after age 67. Talk to your health care provider about which screenings and vaccines you need and how often you need them. This information is not intended to replace advice given to you by your health care provider. Make sure you discuss any questions you have with your health care provider. Document Released: 04/11/2015 Document Revised: 12/03/2015 Document Reviewed: 01/14/2015 Elsevier Interactive Patient Education  2017 [place] Prevention in the Home Falls can cause injuries. They can happen to people of all ages. There are many things you can do to make your home safe and to help prevent falls. What can I do on the outside of my home?  Regularly fix the edges of walkways and driveways and fix any cracks.  Remove anything that might make you trip as you walk through a door, such as a raised step or threshold.  Trim any bushes or trees on the path to your home.  Use bright outdoor lighting.  Clear any walking paths of anything that might make someone trip, such as rocks or tools.  Regularly check to see if handrails are loose or broken. Make sure that both sides of any steps have handrails.  Any raised decks and porches should have guardrails on the edges.  Have any leaves, snow, or ice cleared regularly.  Use sand or salt on walking paths during winter.  Clean up any spills in your garage right away. This includes oil or grease spills. What can I do in the bathroom?  Use night lights.  Install grab bars by the toilet and in the tub and shower. Do not use towel bars as grab bars.  Use non-skid mats or decals in the tub or shower.  If you need to sit down in the shower, use a plastic, non-slip stool.  Keep the floor dry. Clean up any  water that spills on the floor as soon as it happens.  Remove soap buildup in the tub or shower regularly.  Attach bath mats securely with double-sided non-slip rug tape.  Do not have throw rugs and other things on the floor that can make you trip. What can I do in the bedroom?  Use night lights.  Make sure that you have a light by your bed that is easy to reach.  Do not use any sheets or blankets that are too big for your bed. They should not hang down onto the floor.  Have a firm chair that has side arms. You can use this for support while you get dressed.  Do not have throw rugs and other things on the floor that can make you trip. What can I do in the kitchen?  Clean up any spills right away.  Avoid walking on wet floors.  Keep items that you use a lot in easy-to-reach places.  If you need to reach something above you, use a strong step stool that has a grab bar.  Keep electrical cords out of the way.  Do not use floor polish or  wax that makes floors slippery. If you must use wax, use non-skid floor wax.  Do not have throw rugs and other things on the floor that can make you trip. What can I do with my stairs?  Do not leave any items on the stairs.  Make sure that there are handrails on both sides of the stairs and use them. Fix handrails that are broken or loose. Make sure that handrails are as long as the stairways.  Check any carpeting to make sure that it is firmly attached to the stairs. Fix any carpet that is loose or worn.  Avoid having throw rugs at the top or bottom of the stairs. If you do have throw rugs, attach them to the floor with carpet tape.  Make sure that you have a light switch at the top of the stairs and the bottom of the stairs. If you do not have them, ask someone to add them for you. What else can I do to help prevent falls?  Wear shoes that:  Do not have high heels.  Have rubber bottoms.  Are comfortable and fit you well.  Are closed  at the toe. Do not wear sandals.  If you use a stepladder:  Make sure that it is fully opened. Do not climb a closed stepladder.  Make sure that both sides of the stepladder are locked into place.  Ask someone to hold it for you, if possible.  Clearly mark and make sure that you can see:  Any grab bars or handrails.  First and last steps.  Where the edge of each step is.  Use tools that help you move around (mobility aids) if they are needed. These include:  Canes.  Walkers.  Scooters.  Crutches.  Turn on the lights when you go into a dark area. Replace any light bulbs as soon as they burn out.  Set up your furniture so you have a clear path. Avoid moving your furniture around.  If any of your floors are uneven, fix them.  If there are any pets around you, be aware of where they are.  Review your medicines with your doctor. Some medicines can make you feel dizzy. This can increase your chance of falling. Ask your doctor what other things that you can do to help prevent falls. This information is not intended to replace advice given to you by your health care provider. Make sure you discuss any questions you have with your health care provider. Document Released: 01/09/2009 Document Revised: 08/21/2015 Document Reviewed: 04/19/2014 Elsevier Interactive Patient Education  2017 [employer].

## 2019-04-03 ENCOUNTER — Ambulatory Visit: Payer: Self-pay

## 2019-04-03 NOTE — Telephone Encounter (Signed)
Pt called in c/o pain under her left breast and upper back that started this morning after going to the bathroom and having a BM.  It feels like a cramp that is now a sharp pain.   It's worse with a deep breath or movement.   "It's just a pain that is not going away". She has a very extensive cardiac history.   She denies any other symptoms like shortness of breath, sweating, dizziness, etc.  She was just in the hospital on Dec. 8th for "acute bronchitis".   She does not want to go to the ED.   She really wants to see Dr. Rosanna Randy instead.   "Can't he just give me some pain medicine?"  I called in Dr. Alben Spittle office and spoke with Helene Kelp.   She agreed with me that the pt needs to go to the ED with her cardiac history.  I got back on the line with Ms. Malki and let her know she needed to go to the ED.  She was agreeable to going.   She is going to call her daughter to take her now to [facility].  I sent my notes to Dr. Alben Spittle office.  Reason for Disposition . Taking a deep breath makes pain worse  Answer Assessment - Initial Assessment Questions 1. LOCATION: "Where does it hurt?"       I'm having pain under my left breast like a cramp and now it's a sharp pain   It's under my left breast and ribs.    2. RADIATION: "Does the pain go anywhere else?" (e.g., into neck, jaw, arms, back)     I have pain in my upper back that started yesterday. 3. ONSET: "When did the chest pain begin?" (Minutes, hours or days)      This morning 4. PATTERN "Does the pain come and go, or has it been constant since it started?"  "Does it get worse with exertion?"      The pain is constant. 5. DURATION: "How long does it last" (e.g., seconds, minutes, hours)     constant 6. SEVERITY: "How bad is the pain?"  (e.g., Scale 1-10; mild, moderate, or severe)    - MILD (1-3): doesn't interfere with normal activities     - MODERATE (4-7): interferes with normal activities or awakens from sleep  - SEVERE (8-10): excruciating pain, unable to do any normal activities       7 on pain scale 7. CARDIAC RISK FACTORS: "Do you have any history of heart problems or risk factors for heart disease?" (e.g., angina, prior heart attack; diabetes, high blood pressure, high cholesterol, smoker, or strong family history of heart disease)     I have plenty of heart problem.s    I saw my cardiologist last week and my stress test results were fine.    I see him again in Feb.  I had a heart attack and a stent put in, a quadruple bypass.    Heart failure.    I've had 2 strokes.  Chronic tension headaches for a year. 8. PULMONARY RISK FACTORS: "Do you have any history of lung disease?"  (e.g., blood clots in lung, asthma, emphysema, birth control pills)     I keep getting bronchitis.     I was in the hospital Dec 8th for an acute case of bronchitis for 3-4 days.   I was in [facility].   9. CAUSE: "What do you think is causing the chest  pain?"     I thought it was a pulled muscle but it doesn't feel like that.    I think it could be my bowels or intestines.     I was constipated this morning then had diarrhea all this morning.    This pain started after I went to the bathroom.   I've never had this pain before.    This pain does not feel like heart pain like I've had in the past. 10. OTHER SYMPTOMS: "Do you have any other symptoms?" (e.g., dizziness, nausea, vomiting, sweating, fever, difficulty breathing, cough)       No shortness of breath, no sweating.  No dizziness.   Just this nagging pain under my left breast.   11. PREGNANCY: "Is there any chance you are pregnant?" "When was your last menstrual period?"       N/A due to age  Protocols used: CHEST PAIN-A-AH

## 2019-04-03 NOTE — Telephone Encounter (Signed)
FYI, This is from [facility]

## 2019-04-05 ENCOUNTER — Telehealth: Payer: Self-pay | Admitting: Family Medicine

## 2019-04-05 ENCOUNTER — Emergency Department
Admission: EM | Admit: 2019-04-05 | Discharge: 2019-04-05 | Disposition: A | Discharge status: Home / Self Care | Payer: Medicare Other | Attending: Emergency Medicine | Admitting: Emergency Medicine

## 2019-04-05 ENCOUNTER — Emergency Department: Payer: Medicare Other

## 2019-04-05 ENCOUNTER — Other Ambulatory Visit: Payer: Self-pay

## 2019-04-05 DIAGNOSIS — R110 Nausea: Secondary | ICD-10-CM | POA: Diagnosis not present

## 2019-04-05 DIAGNOSIS — J449 Chronic obstructive pulmonary disease, unspecified: Secondary | ICD-10-CM | POA: Diagnosis not present

## 2019-04-05 DIAGNOSIS — Z951 Presence of aortocoronary bypass graft: Secondary | ICD-10-CM | POA: Diagnosis not present

## 2019-04-05 DIAGNOSIS — Z87891 Personal history of nicotine dependence: Secondary | ICD-10-CM | POA: Insufficient documentation

## 2019-04-05 DIAGNOSIS — I2510 Atherosclerotic heart disease of native coronary artery without angina pectoris: Secondary | ICD-10-CM | POA: Insufficient documentation

## 2019-04-05 DIAGNOSIS — R0602 Shortness of breath: Secondary | ICD-10-CM | POA: Insufficient documentation

## 2019-04-05 DIAGNOSIS — Z955 Presence of coronary angioplasty implant and graft: Secondary | ICD-10-CM | POA: Diagnosis not present

## 2019-04-05 DIAGNOSIS — E119 Type 2 diabetes mellitus without complications: Secondary | ICD-10-CM | POA: Insufficient documentation

## 2019-04-05 DIAGNOSIS — R079 Chest pain, unspecified: Secondary | ICD-10-CM | POA: Insufficient documentation

## 2019-04-05 DIAGNOSIS — I252 Old myocardial infarction: Secondary | ICD-10-CM | POA: Insufficient documentation

## 2019-04-05 DIAGNOSIS — I110 Hypertensive heart disease with heart failure: Secondary | ICD-10-CM | POA: Diagnosis not present

## 2019-04-05 DIAGNOSIS — I5032 Chronic diastolic (congestive) heart failure: Secondary | ICD-10-CM | POA: Insufficient documentation

## 2019-04-05 DIAGNOSIS — R0789 Other chest pain: Secondary | ICD-10-CM | POA: Diagnosis present

## 2019-04-05 LAB — BASIC METABOLIC PANEL
Anion gap: 12 (ref 5–15)
BUN: 8 mg/dL (ref 8–23)
CO2: 27 mmol/L (ref 22–32)
Calcium: 9.3 mg/dL (ref 8.9–10.3)
Chloride: 103 mmol/L (ref 98–111)
Creatinine, Ser: 0.74 mg/dL (ref 0.44–1.00)
GFR calc Af Amer: >60 mL/min (ref >60)
GFR calc non Af Amer: >60 mL/min (ref >60)
Glucose, Bld: 166 mg/dL — ABNORMAL HIGH (ref 70–99)
Potassium: 3.2 mmol/L — ABNORMAL LOW (ref 3.5–5.1)
Sodium: 142 mmol/L (ref 135–145)

## 2019-04-05 LAB — CBC
HCT: 43.3 % (ref 36.0–46.0)
Hemoglobin: 14.3 g/dL (ref 12.0–15.0)
MCH: 31.6 pg (ref 26.0–34.0)
MCHC: 33 g/dL (ref 30.0–36.0)
MCV: 95.6 fL (ref 80.0–100.0)
Platelets: 286 10*3/uL (ref 150–400)
RBC: 4.53 MIL/uL (ref 3.87–5.11)
RDW: 11.9 % (ref 11.5–15.5)
WBC: 5.5 10*3/uL (ref 4.0–10.5)
nRBC: 0 % (ref 0.0–0.2)

## 2019-04-05 LAB — TROPONIN I (HIGH SENSITIVITY)
Troponin I (High Sensitivity): 12 ng/L (ref <18)
Troponin I (High Sensitivity): 12 ng/L (ref <18)

## 2019-04-05 MED ORDER — OXYCODONE-ACETAMINOPHEN 5-325 MG PO TABS
1.0000 | ORAL_TABLET | Freq: Once | ORAL | Status: AC
  Administered 2019-04-05: 12:00:00 1 via ORAL
  Filled 2019-04-05: qty 1

## 2019-04-05 MED ORDER — NITROGLYCERIN 0.4 MG SL SUBL
0.4000 mg | SUBLINGUAL_TABLET | Freq: Once | SUBLINGUAL | Status: AC
  Administered 2019-04-05: 0.4 mg via SUBLINGUAL
  Filled 2019-04-05: qty 1

## 2019-04-05 MED ORDER — SODIUM CHLORIDE 0.9% FLUSH: 3.0000 mL | Freq: Once | INTRAVENOUS | Status: DC

## 2019-04-05 NOTE — ED Provider Notes (Addendum)
[facility] Emergency Department Provider Note       Time seen: ----------------------------------------- 11:24 AM on 04/05/2019 -----------------------------------------   I have reviewed the triage vital signs and the nursing notes.  HISTORY   Chief Complaint Chest Pain    HPI Candace [name] is a 69 y.o. female with a history of allergies, anemia, arthritis, coronary disease, depression, diabetes, GERD, hyperlipidemia, hypertension, MI, pneumonia, CVA who presents to the ED for constant chest tightness to the left side of the chest that radiates around to the back since Monday with shortness of breath and nausea.  Discomfort is 7 out of 10.  Past Medical History:  Diagnosis Date  . Allergy   . Anemia   . Anxiety   . Arrhythmia   . Arthritis   . Coronary artery disease   . Depression   . Diabetes mellitus without complication ([place])   . Dyspnea    doe  . Dysrhythmia   . GERD (gastroesophageal reflux disease)   . Headache   . History of hiatal hernia   . Hyperlipidemia   . Hypertension   . Myocardial infarction ([place])    2016, 04/2017  . Myocardial infarction with cardiac rehabilitation [facility])    MI 2016/ CABG 8/17    FINISHED CARDIAC REHAB 3 WEEKS AGO  . Panic attack   . Pneumonia   . Reflux   . Stroke [facility]) 2015   showed up on MRI; no weakness noted  . TIA (transient ischemic attack)   . Voice tremor     Patient Active Problem List   Diagnosis Date Noted  . Vertigo   . Unsteady gait   . Acute bronchitis 03/07/2019  . Esophageal dysphagia   . Stomach irritation   . Gastric polyp   . Esophageal lump   . Hx of colonic polyp   . Polyp of colon   . Diverticulosis of large intestine without diverticulitis   . PSVT (paroxysmal supraventricular tachycardia) ([place]) 10/03/2018  . Abnormal ECG 07/05/2018  . Tachycardia 03/27/2018  . Pain in limb 02/28/2018  . Chronic diastolic CHF (congestive heart failure) ([place]) 11/03/2017  . TIA  (transient ischemic attack) 11/03/2017  . COPD suggested by initial evaluation ([place]) 08/23/2017  . Acute and chronic respiratory failure with hypoxia ([place]) 08/23/2017  . Postoperative state 08/15/2017  . Incidental pulmonary nodule 08/01/2017  . Non-ST elevation myocardial infarction (NSTEMI), subendocardial infarction, subsequent episode of care ([place]) 06/01/2017  . B12 deficiency 12/01/2016  . Vitamin D deficiency 11/04/2016  . Depression with anxiety 09/30/2016  . Paroxysmal A-fib ([place]) 09/30/2016  . Resting tremor 09/30/2016  . Mild obstructive sleep apnea 09/30/2016  . Headache 08/18/2016  . Unstable angina ([place]) 08/03/2016  . Type 2 diabetes mellitus with hyperlipidemia ([place]) 08/03/2016  . Asthma 11/11/2015  . S/P CABG x 4 11/10/2015  . Coronary artery disease 11/07/2015  . Carotid artery narrowing 02/08/2014  . Chest pain 02/08/2014  . Mixed hyperlipidemia 02/08/2014  . Atypical chest pain 02/07/2014  . Essential (primary) hypertension 02/07/2014  . Awareness of heartbeats 02/07/2014  . D (diarrhea) 10/05/2013  . Acid reflux 10/05/2013    Past Surgical History:  Procedure Laterality Date  . ABDOMINAL HYSTERECTOMY    . APPENDECTOMY  1975  . ARTERY BIOPSY Right 04/26/2016   Procedure: BIOPSY TEMPORAL ARTERY;  Surgeon: Margaretha Sheffield, MD;  Location: [facility] ORS;  Service: ENT;  Laterality: Right;  . CARDIAC CATHETERIZATION N/A 11/06/2015   Procedure: Left Heart Cath and Coronary Angiography;  Surgeon:  Corey Skains, MD;  Location: [place] [facility];  Service: Cardiovascular;  Laterality: N/A;  . CESAREAN SECTION    . COLONOSCOPY  2015  . COLONOSCOPY WITH PROPOFOL N/A 11/03/2018   Procedure: COLONOSCOPY WITH PROPOFOL;  Surgeon: Virgel Manifold, MD;  Location: [facility] ENDOSCOPY;  Service: Endoscopy;  Laterality: N/A;  . CORONARY ANGIOPLASTY  04/2017   [place]  . CORONARY ARTERY BYPASS GRAFT N/A 11/10/2015   Procedure: CORONARY ARTERY BYPASS GRAFTING (CABG), ON  PUMP, TIMES FOUR, USING LEFT INTERNAL MAMMARY ARTERY, BILATERAL GREATER SAPHENOUS VEINS HARVESTED ENDOSCOPICALLY;  Surgeon: [name], MD;  Location: [place];  Service: Open Heart Surgery;  Laterality: N/A;  LIMA-LAD; SEQ SVG-OM1-OM2; SVG-PL  . CORONARY STENT INTERVENTION N/A 08/05/2016   Procedure: Coronary Stent Intervention;  Surgeon: Isaias Cowman, MD;  Location: [place] [facility];  Service: Cardiovascular;  Laterality: N/A;  . ESOPHAGOGASTRODUODENOSCOPY (EGD) WITH PROPOFOL N/A 11/03/2018   Procedure: ESOPHAGOGASTRODUODENOSCOPY (EGD) WITH PROPOFOL;  Surgeon: Virgel Manifold, MD;  Location: [facility] ENDOSCOPY;  Service: Endoscopy;  Laterality: N/A;  . HYSTERECTOMY ABDOMINAL WITH SALPINGO-OOPHORECTOMY Bilateral 08/15/2017   Procedure: HYSTERECTOMY ABDOMINAL WITH BILATERAL SALPINGO-OOPHORECTOMY;  Surgeon: Rubie Maid, MD;  Location: [facility] ORS;  Service: Gynecology;  Laterality: Bilateral;  . LEFT HEART CATH AND CORONARY ANGIOGRAPHY N/A 08/05/2016   Procedure: Left Heart Cath and Coronary Angiography;  Surgeon: Isaias Cowman, MD;  Location: [place] [facility];  Service: Cardiovascular;  Laterality: N/A;  . LEFT HEART CATH AND CORS/GRAFTS ANGIOGRAPHY N/A 09/19/2018   Procedure: LEFT HEART CATH AND CORS/GRAFTS ANGIOGRAPHY;  Surgeon: Corey Skains, MD;  Location: [place] [facility];  Service: Cardiovascular;  Laterality: N/A;  . OOPHORECTOMY    . TEE WITHOUT CARDIOVERSION N/A 11/10/2015   Procedure: TRANSESOPHAGEAL ECHOCARDIOGRAM (TEE);  Surgeon: [name], MD;  Location: [place];  Service: Open Heart Surgery;  Laterality: N/A;  . TUBAL LIGATION      Allergies Lisinopril and Penicillins  Social History Social History   Tobacco Use  . Smoking status: Former Smoker    Packs/day: 0.50    Types: Cigarettes    Quit date: 10/07/2001    Years since quitting: 17.5  . Smokeless tobacco: Never Used  Substance Use Topics  . Alcohol use: No    Alcohol/week: 0.0  standard drinks  . Drug use: No    Review of Systems Constitutional: Negative for fever. Cardiovascular: Positive for chest tightness Respiratory: Positive for shortness of breath Gastrointestinal: Positive for abdominal pain Musculoskeletal: Negative for back pain. Skin: Negative for rash. Neurological: Negative for headaches, focal weakness or numbness.  All systems negative/normal/unremarkable except as stated in the HPI  ____________________________________________   PHYSICAL EXAM:  VITAL SIGNS: ED Triage Vitals  Enc Vitals Group     BP 04/05/19 0758 (!) 142/89     Pulse Rate 04/05/19 0758 98     Resp 04/05/19 0758 18     Temp 04/05/19 0759 98.3 F (36.8 C)     Temp Source 04/05/19 0758 Oral     SpO2 04/05/19 0758 98 %     Weight 04/05/19 0757 165 lb (74.8 kg)     Height 04/05/19 0757 5\' 2"  (1.575 m)     Head Circumference --      Peak Flow --      Pain Score 04/05/19 0757 7     Pain Loc --      Pain Edu? --      Excl. in [place]? --  Constitutional: Alert and oriented. Well appearing and in no distress. Eyes: Conjunctivae are normal. Normal extraocular movements. Cardiovascular: Normal rate, regular rhythm. No murmurs, rubs, or gallops. Respiratory: Normal respiratory effort without tachypnea nor retractions. Breath sounds are clear and equal bilaterally. No wheezes/rales/rhonchi. Gastrointestinal: Left upper quadrant tenderness, normal bowel sounds. Musculoskeletal: Nontender with normal range of motion in extremities.  Left inferior axillary rib tenderness Neurologic:  Normal speech and language. No gross focal neurologic deficits are appreciated.  Skin:  Skin is warm, dry and intact. No rash noted. Psychiatric: Mood and affect are normal. Speech and behavior are normal.  ____________________________________________  EKG: Interpreted by me.  Sinus rhythm with rate of 95 bpm, left bundle branch block, long QT  ____________________________________________  ED  COURSE:  As part of my medical decision making, I reviewed the following data within the [facility] History obtained from family if available, nursing notes, old chart and ekg, as well as notes from prior ED visits. Patient presented for chest tightness with shortness of breath and nausea and seem to be tender in the left upper quadrant, we will assess with labs and imaging as indicated at this time.   Procedures  OTERIA HOSKINS was evaluated in Emergency Department on 04/05/2019 for the symptoms described in the history of present illness. She was evaluated in the context of the global COVID-19 pandemic, which necessitated consideration that the patient might be at risk for infection with the SARS-CoV-2 virus that causes COVID-19. Institutional protocols and algorithms that pertain to the evaluation of patients at risk for COVID-19 are in a state of rapid change based on information released by regulatory bodies including the CDC and federal and state organizations. These policies and algorithms were followed during the patient's care in the ED.  ____________________________________________   LABS (pertinent positives/negatives)  Labs Reviewed  BASIC METABOLIC PANEL - Abnormal; Notable for the following components:      Result Value   Potassium 3.2 (*)    Glucose, Bld 166 (*)    All other components within normal limits  CBC  TROPONIN I (HIGH SENSITIVITY)  TROPONIN I (HIGH SENSITIVITY)    RADIOLOGY Images were viewed by me Chest x-ray IMPRESSION: Post CABG.  No acute abnormalities. Abdomen 2 view IMPRESSION:  Negative.  ____________________________________________   DIFFERENTIAL DIAGNOSIS   Musculoskeletal pain, GERD, anxiety, pneumonia, COVID-19, PE, MI, gas pain, constipation  FINAL ASSESSMENT AND PLAN  Chest pain, constipation   Plan: The patient had presented for nonspecific symptoms. Patient's labs were reassuring. Patient's imaging did not reveal  any acute process.  Given her left bundle branch block on EKG I did discuss with cardiology who will see her in close outpatient follow-up.  She does have some evidence of constipation or gas related symptoms.  I will encourage laxatives and outpatient follow-up.   Laurence Aly, MD    Note: This note was generated in part or whole with voice recognition software. Voice recognition is usually quite accurate but there are transcription errors that can and very often do occur. I apologize for any typographical errors that were not detected and corrected.     Earleen Newport, MD 04/05/19 1245    Earleen Newport, MD 04/05/19 1245

## 2019-04-05 NOTE — ED Notes (Signed)
pAXR at bedside

## 2019-04-05 NOTE — ED Triage Notes (Signed)
Pt c/o constant chest tightness to the left side of the chest that radiates around to the back since Monday with SOB and nausea. Pt has a hx of open heart surgery. Pt is NAD at present.

## 2019-04-05 NOTE — Telephone Encounter (Signed)
Pt stated her Semaglutide,0.25 or 0.5MG /DOS, (OZEMPIC, 0.25 OR 0.5 MG/DOSE,) 2 MG/1.5ML SOPN Is causing her nausea and constipation. She would like to know if there is an alternative rx that can be sent in.

## 2019-04-05 NOTE — Telephone Encounter (Signed)
I would just stop it and we will f/u on next A1c with DM.Stop Ozempic.

## 2019-04-05 NOTE — Telephone Encounter (Signed)
Please advise 

## 2019-04-06 NOTE — Telephone Encounter (Signed)
Patient was advised and expressed understanding.  

## 2019-05-04 ENCOUNTER — Other Ambulatory Visit: Payer: Self-pay | Admitting: Family Medicine

## 2019-05-04 DIAGNOSIS — E118 Type 2 diabetes mellitus with unspecified complications: Secondary | ICD-10-CM

## 2019-05-08 ENCOUNTER — Other Ambulatory Visit: Payer: Self-pay

## 2019-05-08 ENCOUNTER — Emergency Department: Payer: Medicare Other

## 2019-05-08 ENCOUNTER — Encounter: Payer: Self-pay | Admitting: Emergency Medicine

## 2019-05-08 ENCOUNTER — Emergency Department
Admission: EM | Admit: 2019-05-08 | Discharge: 2019-05-08 | Disposition: A | Discharge status: Home / Self Care | Payer: Medicare Other | Attending: Emergency Medicine | Admitting: Emergency Medicine

## 2019-05-08 DIAGNOSIS — Z951 Presence of aortocoronary bypass graft: Secondary | ICD-10-CM | POA: Insufficient documentation

## 2019-05-08 DIAGNOSIS — I110 Hypertensive heart disease with heart failure: Secondary | ICD-10-CM | POA: Insufficient documentation

## 2019-05-08 DIAGNOSIS — I252 Old myocardial infarction: Secondary | ICD-10-CM | POA: Insufficient documentation

## 2019-05-08 DIAGNOSIS — Z8673 Personal history of transient ischemic attack (TIA), and cerebral infarction without residual deficits: Secondary | ICD-10-CM | POA: Insufficient documentation

## 2019-05-08 DIAGNOSIS — E119 Type 2 diabetes mellitus without complications: Secondary | ICD-10-CM | POA: Diagnosis not present

## 2019-05-08 DIAGNOSIS — I5032 Chronic diastolic (congestive) heart failure: Secondary | ICD-10-CM | POA: Diagnosis not present

## 2019-05-08 DIAGNOSIS — R0789 Other chest pain: Secondary | ICD-10-CM | POA: Diagnosis present

## 2019-05-08 DIAGNOSIS — I2510 Atherosclerotic heart disease of native coronary artery without angina pectoris: Secondary | ICD-10-CM | POA: Insufficient documentation

## 2019-05-08 DIAGNOSIS — R079 Chest pain, unspecified: Secondary | ICD-10-CM | POA: Diagnosis not present

## 2019-05-08 DIAGNOSIS — Z87891 Personal history of nicotine dependence: Secondary | ICD-10-CM | POA: Diagnosis not present

## 2019-05-08 LAB — CBC
HCT: 39.4 % (ref 36.0–46.0)
Hemoglobin: 13.3 g/dL (ref 12.0–15.0)
MCH: 31.4 pg (ref 26.0–34.0)
MCHC: 33.8 g/dL (ref 30.0–36.0)
MCV: 93.1 fL (ref 80.0–100.0)
Platelets: 237 10*3/uL (ref 150–400)
RBC: 4.23 MIL/uL (ref 3.87–5.11)
RDW: 12.2 % (ref 11.5–15.5)
WBC: 7.7 10*3/uL (ref 4.0–10.5)
nRBC: 0 % (ref 0.0–0.2)

## 2019-05-08 LAB — BASIC METABOLIC PANEL
Anion gap: 11 (ref 5–15)
BUN: 11 mg/dL (ref 8–23)
CO2: 23 mmol/L (ref 22–32)
Calcium: 9.8 mg/dL (ref 8.9–10.3)
Chloride: 103 mmol/L (ref 98–111)
Creatinine, Ser: 0.81 mg/dL (ref 0.44–1.00)
GFR calc Af Amer: >60 mL/min (ref >60)
GFR calc non Af Amer: >60 mL/min (ref >60)
Glucose, Bld: 206 mg/dL — ABNORMAL HIGH (ref 70–99)
Potassium: 3.6 mmol/L (ref 3.5–5.1)
Sodium: 137 mmol/L (ref 135–145)

## 2019-05-08 LAB — TROPONIN I (HIGH SENSITIVITY): Troponin I (High Sensitivity): 9 ng/L (ref <18)

## 2019-05-08 MED ORDER — ACETAMINOPHEN 500 MG PO TABS
ORAL_TABLET | ORAL | Status: AC
  Filled 2019-05-08: qty 2

## 2019-05-08 MED ORDER — ACETAMINOPHEN 500 MG PO TABS
1000.0000 mg | ORAL_TABLET | Freq: Once | ORAL | Status: AC
  Administered 2019-05-08: 18:00:00 1000 mg via ORAL

## 2019-05-08 NOTE — ED Triage Notes (Signed)
Pt in via POV, reports generalized chest pain since Saturday w/ associated back pain and nausea.  Ambulatory to triage, NAD noted at this time.

## 2019-05-08 NOTE — ED Provider Notes (Signed)
[facility] Emergency Department Provider Note       Time seen: ----------------------------------------- 5:27 PM on 05/08/2019 -----------------------------------------   I have reviewed the triage vital signs and the nursing notes.  HISTORY  Chief Complaint Chest Pain    HPI Candace [name] is a 69 y.o. female with a history of allergies, anemia, coronary artery disease, diabetes, dyspnea, hyperlipidemia, hypertension, MI, CVA who presents to the ED for sharp stabbing chest pain that started on Saturday.  She is also had some nausea.  Discomfort is 7 out of 10, nothing makes it better or worse.  She denies any recent illness or other complaints.  Past Medical History:  Diagnosis Date  . Allergy   . Anemia   . Anxiety   . Arrhythmia   . Arthritis   . Coronary artery disease   . Depression   . Diabetes mellitus without complication ([place])   . Dyspnea    doe  . Dysrhythmia   . GERD (gastroesophageal reflux disease)   . Headache   . History of hiatal hernia   . Hyperlipidemia   . Hypertension   . Myocardial infarction ([place])    2016, 04/2017  . Myocardial infarction with cardiac rehabilitation [facility])    MI 2016/ CABG 8/17    FINISHED CARDIAC REHAB 3 WEEKS AGO  . Panic attack   . Pneumonia   . Reflux   . Stroke [facility]) 2015   showed up on MRI; no weakness noted  . TIA (transient ischemic attack)   . Voice tremor     Patient Active Problem List   Diagnosis Date Noted  . Vertigo   . Unsteady gait   . Acute bronchitis 03/07/2019  . Esophageal dysphagia   . Stomach irritation   . Gastric polyp   . Esophageal lump   . Hx of colonic polyp   . Polyp of colon   . Diverticulosis of large intestine without diverticulitis   . PSVT (paroxysmal supraventricular tachycardia) ([place]) 10/03/2018  . Abnormal ECG 07/05/2018  . Tachycardia 03/27/2018  . Pain in limb 02/28/2018  . Chronic diastolic CHF (congestive heart failure) ([place]) 11/03/2017  . TIA  (transient ischemic attack) 11/03/2017  . COPD suggested by initial evaluation ([place]) 08/23/2017  . Acute and chronic respiratory failure with hypoxia ([place]) 08/23/2017  . Postoperative state 08/15/2017  . Incidental pulmonary nodule 08/01/2017  . Non-ST elevation myocardial infarction (NSTEMI), subendocardial infarction, subsequent episode of care ([place]) 06/01/2017  . B12 deficiency 12/01/2016  . Vitamin D deficiency 11/04/2016  . Depression with anxiety 09/30/2016  . Paroxysmal A-fib ([place]) 09/30/2016  . Resting tremor 09/30/2016  . Mild obstructive sleep apnea 09/30/2016  . Headache 08/18/2016  . Unstable angina ([place]) 08/03/2016  . Type 2 diabetes mellitus with hyperlipidemia ([place]) 08/03/2016  . Asthma 11/11/2015  . S/P CABG x 4 11/10/2015  . Coronary artery disease 11/07/2015  . Carotid artery narrowing 02/08/2014  . Chest pain 02/08/2014  . Mixed hyperlipidemia 02/08/2014  . Atypical chest pain 02/07/2014  . Essential (primary) hypertension 02/07/2014  . Awareness of heartbeats 02/07/2014  . D (diarrhea) 10/05/2013  . Acid reflux 10/05/2013    Past Surgical History:  Procedure Laterality Date  . ABDOMINAL HYSTERECTOMY    . APPENDECTOMY  1975  . ARTERY BIOPSY Right 04/26/2016   Procedure: BIOPSY TEMPORAL ARTERY;  Surgeon: Margaretha Sheffield, MD;  Location: [facility] ORS;  Service: ENT;  Laterality: Right;  . CARDIAC CATHETERIZATION N/A 11/06/2015   Procedure: Left Heart Cath and Coronary  Angiography;  Surgeon: Corey Skains, MD;  Location: [place] CV LAB;  Service: Cardiovascular;  Laterality: N/A;  . CESAREAN SECTION    . COLONOSCOPY  2015  . COLONOSCOPY WITH PROPOFOL N/A 11/03/2018   Procedure: COLONOSCOPY WITH PROPOFOL;  Surgeon: Virgel Manifold, MD;  Location: [facility] ENDOSCOPY;  Service: Endoscopy;  Laterality: N/A;  . CORONARY ANGIOPLASTY  04/2017   [place]  . CORONARY ARTERY BYPASS GRAFT N/A 11/10/2015   Procedure: CORONARY ARTERY BYPASS GRAFTING (CABG), ON  PUMP, TIMES FOUR, USING LEFT INTERNAL MAMMARY ARTERY, BILATERAL GREATER SAPHENOUS VEINS HARVESTED ENDOSCOPICALLY;  Surgeon: [name], MD;  Location: [place];  Service: Open Heart Surgery;  Laterality: N/A;  LIMA-LAD; SEQ SVG-OM1-OM2; SVG-PL  . CORONARY STENT INTERVENTION N/A 08/05/2016   Procedure: Coronary Stent Intervention;  Surgeon: Isaias Cowman, MD;  Location: [place] CV LAB;  Service: Cardiovascular;  Laterality: N/A;  . ESOPHAGOGASTRODUODENOSCOPY (EGD) WITH PROPOFOL N/A 11/03/2018   Procedure: ESOPHAGOGASTRODUODENOSCOPY (EGD) WITH PROPOFOL;  Surgeon: Virgel Manifold, MD;  Location: [facility] ENDOSCOPY;  Service: Endoscopy;  Laterality: N/A;  . HYSTERECTOMY ABDOMINAL WITH SALPINGO-OOPHORECTOMY Bilateral 08/15/2017   Procedure: HYSTERECTOMY ABDOMINAL WITH BILATERAL SALPINGO-OOPHORECTOMY;  Surgeon: Rubie Maid, MD;  Location: [facility] ORS;  Service: Gynecology;  Laterality: Bilateral;  . LEFT HEART CATH AND CORONARY ANGIOGRAPHY N/A 08/05/2016   Procedure: Left Heart Cath and Coronary Angiography;  Surgeon: Isaias Cowman, MD;  Location: [place] [facility];  Service: Cardiovascular;  Laterality: N/A;  . LEFT HEART CATH AND CORS/GRAFTS ANGIOGRAPHY N/A 09/19/2018   Procedure: LEFT HEART CATH AND CORS/GRAFTS ANGIOGRAPHY;  Surgeon: Corey Skains, MD;  Location: [place] CV LAB;  Service: Cardiovascular;  Laterality: N/A;  . OOPHORECTOMY    . TEE WITHOUT CARDIOVERSION N/A 11/10/2015   Procedure: TRANSESOPHAGEAL ECHOCARDIOGRAM (TEE);  Surgeon: [name], MD;  Location: [place];  Service: Open Heart Surgery;  Laterality: N/A;  . TUBAL LIGATION      Allergies Lisinopril and Penicillins  Social History Social History   Tobacco Use  . Smoking status: Former Smoker    Packs/day: 0.50    Types: Cigarettes    Quit date: 10/07/2001    Years since quitting: 17.5  . Smokeless tobacco: Never Used  Substance Use Topics  . Alcohol use: No    Alcohol/week: 0.0  standard drinks  . Drug use: No    Review of Systems Constitutional: Negative for fever. Cardiovascular: Positive for chest pain Respiratory: Negative for shortness of breath. Gastrointestinal: Negative for abdominal pain, positive for nausea Musculoskeletal: Negative for back pain. Skin: Negative for rash. Neurological: Negative for headaches, focal weakness or numbness.  All systems negative/normal/unremarkable except as stated in the HPI  ____________________________________________   PHYSICAL EXAM:  VITAL SIGNS: ED Triage Vitals  Enc Vitals Group     BP 05/08/19 1621 (!) 169/84     Pulse Rate 05/08/19 1621 77     Resp 05/08/19 1621 19     Temp 05/08/19 1621 98.6 F (37 C)     Temp Source 05/08/19 1621 Oral     SpO2 05/08/19 1621 98 %     Weight 05/08/19 1624 168 lb (76.2 kg)     Height 05/08/19 1624 5\' 2"  (1.575 m)     Head Circumference --      Peak Flow --      Pain Score 05/08/19 1623 7     Pain Loc --      Pain Edu? --      Excl.  in [place]? --     Constitutional: Alert and oriented. Well appearing and in no distress. Eyes: Conjunctivae are normal. Normal extraocular movements. Cardiovascular: Normal rate, regular rhythm. No murmurs, rubs, or gallops. Respiratory: Normal respiratory effort without tachypnea nor retractions. Breath sounds are clear and equal bilaterally. No wheezes/rales/rhonchi. Gastrointestinal: Soft and nontender. Normal bowel sounds Musculoskeletal: Nontender with normal range of motion in extremities. No lower extremity tenderness nor edema. Neurologic:  Normal speech and language. No gross focal neurologic deficits are appreciated.  Skin:  Skin is warm, dry and intact. No rash noted. Psychiatric: Mood and affect are normal. Speech and behavior are normal.  ____________________________________________  EKG: Interpreted by me.  Sinus rhythm with rate of 81 bpm, left axis deviation, left bundle branch block, no change from  prior  ____________________________________________  ED COURSE:  As part of my medical decision making, I reviewed the following data within the [place] History obtained from family if available, nursing notes, old chart and ekg, as well as notes from prior ED visits. Patient presented for chest pain, we will assess with labs and imaging as indicated at this time.   Procedures  NICLE MILKOWSKI was evaluated in Emergency Department on 05/08/2019 for the symptoms described in the history of present illness. She was evaluated in the context of the global COVID-19 pandemic, which necessitated consideration that the patient might be at risk for infection with the SARS-CoV-2 virus that causes COVID-19. Institutional protocols and algorithms that pertain to the evaluation of patients at risk for COVID-19 are in a state of rapid change based on information released by regulatory bodies including the CDC and federal and state organizations. These policies and algorithms were followed during the patient's care in the ED.  ____________________________________________   LABS (pertinent positives/negatives)  Labs Reviewed  BASIC METABOLIC PANEL - Abnormal; Notable for the following components:      Result Value   Glucose, Bld 206 (*)    All other components within normal limits  CBC  TROPONIN I (HIGH SENSITIVITY)    RADIOLOGY Images were viewed by me Chest x-ray IMPRESSION: Post CABG.  No acute abnormalities.  ____________________________________________   DIFFERENTIAL DIAGNOSIS   Scar tissue, musculoskeletal pain, GERD, MI, PE, anxiety, pneumothorax  FINAL ASSESSMENT AND PLAN  Chest pain   Plan: The patient had presented for chest pain that she describes as sharp stabbing pain like pins sticking into her chest.  Chest is reproducibly tender, seems to be associated with prior CABG incision.  Patient's labs are reassuring. Patient's imaging did not reveal any acute  process.  Likely musculoskeletal in origin.  I will discuss with her doctor, she is cleared for outpatient follow-up.   Laurence Aly, MD    Note: This note was generated in part or whole with voice recognition software. Voice recognition is usually quite accurate but there are transcription errors that can and very often do occur. I apologize for any typographical errors that were not detected and corrected.     Earleen Newport, MD 05/08/19 [phone]

## 2019-05-14 ENCOUNTER — Ambulatory Visit: Payer: Medicaid Other | Admitting: Family Medicine

## 2019-06-10 ENCOUNTER — Encounter (HOSPITAL_COMMUNITY): Payer: Self-pay

## 2019-06-10 ENCOUNTER — Other Ambulatory Visit: Payer: Self-pay

## 2019-06-10 ENCOUNTER — Emergency Department (HOSPITAL_COMMUNITY): Payer: Medicare Other

## 2019-06-10 ENCOUNTER — Emergency Department (HOSPITAL_COMMUNITY)
Admission: EM | Admit: 2019-06-10 | Discharge: 2019-06-10 | Disposition: A | Discharge status: Home / Self Care | Payer: Medicare Other | Attending: Emergency Medicine | Admitting: Emergency Medicine

## 2019-06-10 DIAGNOSIS — I2510 Atherosclerotic heart disease of native coronary artery without angina pectoris: Secondary | ICD-10-CM | POA: Insufficient documentation

## 2019-06-10 DIAGNOSIS — Z87891 Personal history of nicotine dependence: Secondary | ICD-10-CM | POA: Insufficient documentation

## 2019-06-10 DIAGNOSIS — Z951 Presence of aortocoronary bypass graft: Secondary | ICD-10-CM | POA: Diagnosis not present

## 2019-06-10 DIAGNOSIS — R0789 Other chest pain: Secondary | ICD-10-CM | POA: Diagnosis present

## 2019-06-10 DIAGNOSIS — I10 Essential (primary) hypertension: Secondary | ICD-10-CM | POA: Insufficient documentation

## 2019-06-10 DIAGNOSIS — Z79899 Other long term (current) drug therapy: Secondary | ICD-10-CM | POA: Insufficient documentation

## 2019-06-10 DIAGNOSIS — Z7984 Long term (current) use of oral hypoglycemic drugs: Secondary | ICD-10-CM | POA: Diagnosis not present

## 2019-06-10 DIAGNOSIS — R079 Chest pain, unspecified: Secondary | ICD-10-CM

## 2019-06-10 DIAGNOSIS — E119 Type 2 diabetes mellitus without complications: Secondary | ICD-10-CM | POA: Insufficient documentation

## 2019-06-10 HISTORY — DX: Unspecified atrial fibrillation: I48.91

## 2019-06-10 LAB — COMPREHENSIVE METABOLIC PANEL
ALT: 14 U/L (ref 0–44)
AST: 19 U/L (ref 15–41)
Albumin: 4.4 g/dL (ref 3.5–5.0)
Alkaline Phosphatase: 146 U/L — ABNORMAL HIGH (ref 38–126)
Anion gap: 10 (ref 5–15)
BUN: 8 mg/dL (ref 8–23)
CO2: 22 mmol/L (ref 22–32)
Calcium: 9.3 mg/dL (ref 8.9–10.3)
Chloride: 105 mmol/L (ref 98–111)
Creatinine, Ser: 0.6 mg/dL (ref 0.44–1.00)
GFR calc Af Amer: >60 mL/min (ref >60)
GFR calc non Af Amer: >60 mL/min (ref >60)
Glucose, Bld: 319 mg/dL — ABNORMAL HIGH (ref 70–99)
Potassium: 3.5 mmol/L (ref 3.5–5.1)
Sodium: 137 mmol/L (ref 135–145)
Total Bilirubin: 0.5 mg/dL (ref 0.3–1.2)
Total Protein: 7.5 g/dL (ref 6.5–8.1)

## 2019-06-10 LAB — CBC WITH DIFFERENTIAL/PLATELET
Abs Immature Granulocytes: 0.01 10*3/uL (ref 0.00–0.07)
Basophils Absolute: 0 10*3/uL (ref 0.0–0.1)
Basophils Relative: 1 %
Eosinophils Absolute: 0.1 10*3/uL (ref 0.0–0.5)
Eosinophils Relative: 1 %
HCT: 41.4 % (ref 36.0–46.0)
Hemoglobin: 14.2 g/dL (ref 12.0–15.0)
Immature Granulocytes: 0 %
Lymphocytes Relative: 35 %
Lymphs Abs: 1.9 10*3/uL (ref 0.7–4.0)
MCH: 32.4 pg (ref 26.0–34.0)
MCHC: 34.3 g/dL (ref 30.0–36.0)
MCV: 94.5 fL (ref 80.0–100.0)
Monocytes Absolute: 0.4 10*3/uL (ref 0.1–1.0)
Monocytes Relative: 7 %
Neutro Abs: 3.1 10*3/uL (ref 1.7–7.7)
Neutrophils Relative %: 56 %
Platelets: 212 10*3/uL (ref 150–400)
RBC: 4.38 MIL/uL (ref 3.87–5.11)
RDW: 12.3 % (ref 11.5–15.5)
WBC: 5.5 10*3/uL (ref 4.0–10.5)
nRBC: 0 % (ref 0.0–0.2)

## 2019-06-10 LAB — TROPONIN I (HIGH SENSITIVITY)
Troponin I (High Sensitivity): 13 ng/L (ref <18)
Troponin I (High Sensitivity): 15 ng/L (ref <18)

## 2019-06-10 MED ORDER — ALUM & MAG HYDROXIDE-SIMETH 200-200-20 MG/5ML PO SUSP
30.0000 mL | Freq: Once | ORAL | Status: AC
  Administered 2019-06-10: 30 mL via ORAL
  Filled 2019-06-10: qty 30

## 2019-06-10 MED ORDER — MORPHINE SULFATE (PF) 4 MG/ML IV SOLN
4.0000 mg | Freq: Once | INTRAVENOUS | Status: DC
  Filled 2019-06-10: qty 1

## 2019-06-10 MED ORDER — NITROGLYCERIN 0.4 MG SL SUBL
0.4000 mg | SUBLINGUAL_TABLET | SUBLINGUAL | Status: DC | PRN
  Administered 2019-06-10 (×2): 0.4 mg via SUBLINGUAL
  Filled 2019-06-10: qty 1

## 2019-06-10 MED ORDER — KETOROLAC TROMETHAMINE 30 MG/ML IJ SOLN
30.0000 mg | Freq: Once | INTRAMUSCULAR | Status: AC
  Administered 2019-06-10: 30 mg via INTRAVENOUS
  Filled 2019-06-10: qty 1

## 2019-06-10 MED ORDER — TRAMADOL HCL 50 MG PO TABS: 50.0000 mg | ORAL_TABLET | Freq: Four times a day (QID) | ORAL | 0 refills | Status: DC | PRN

## 2019-06-10 MED ORDER — LIDOCAINE VISCOUS HCL 2 % MT SOLN
15.0000 mL | Freq: Once | OROMUCOSAL | Status: AC
  Administered 2019-06-10: 15 mL via ORAL
  Filled 2019-06-10: qty 15

## 2019-06-10 NOTE — ED Triage Notes (Signed)
Pt reports tightness in chest, sob, and feeling like heart racing since yesterday.  Also c/o generalized weakness.  Pt says on the way here she felt very light headed.

## 2019-06-10 NOTE — ED Notes (Signed)
SL Nitro given, patient states pain decreased to 7. B/P decreased to 137/101. No additional nitro given at this time.

## 2019-06-10 NOTE — ED Notes (Signed)
Patient states she is driving home and does not want any medication that would make her drowsy. States she will take ordered toradol but refused morphine. Medication returned to pyxis.

## 2019-06-10 NOTE — ED Notes (Signed)
Patient given SL Nitro x 2. States pain remaining at 7. B/P 158/83.

## 2019-06-10 NOTE — Discharge Instructions (Signed)
Continue medications as previously prescribed.  Follow-up with your cardiologist in the next few days, and return to the ER if you develop worsening pain, high fever, difficulty breathing, or other new and concerning symptoms.

## 2019-06-10 NOTE — ED Provider Notes (Signed)
[facility] EMERGENCY DEPARTMENT Provider Note   CSN: [id number] Arrival date & time: 06/10/19  [id number]     History Chief Complaint  Patient presents with  . Chest Pain    Candace [name] is a 69 y.o. female.  Patient is a 69 year old female with history of coronary artery disease, status post CABG in 2016, atrial fibrillation, diabetes, GERD.  She presents today for evaluation of chest discomfort.  This started yesterday morning and is worsening.  She describes it as a "pounding" in her chest along with shortness of breath and weakness.  She denies any recent exertional symptoms.    She is followed by a cardiologist in [place].  Last heart cath was in the June 2020 showing patent grafts.  The history is provided by the patient.       Past Medical History:  Diagnosis Date  . Allergy   . Anemia   . Anxiety   . Arrhythmia   . Arthritis   . Atrial fibrillation ([place])   . Coronary artery disease   . Depression   . Diabetes mellitus without complication ([place])   . Dyspnea    doe  . Dysrhythmia   . GERD (gastroesophageal reflux disease)   . Headache   . History of hiatal hernia   . Hyperlipidemia   . Hypertension   . Myocardial infarction ([place])    2016, 04/2017  . Myocardial infarction with cardiac rehabilitation [facility])    MI 2016/ CABG 8/17    FINISHED CARDIAC REHAB 3 WEEKS AGO  . Panic attack   . Pneumonia   . Reflux   . Stroke [facility]) 2015   showed up on MRI; no weakness noted  . TIA (transient ischemic attack)   . Voice tremor     Patient Active Problem List   Diagnosis Date Noted  . Vertigo   . Unsteady gait   . Acute bronchitis 03/07/2019  . Esophageal dysphagia   . Stomach irritation   . Gastric polyp   . Esophageal lump   . Hx of colonic polyp   . Polyp of colon   . Diverticulosis of large intestine without diverticulitis   . PSVT (paroxysmal supraventricular tachycardia) ([place]) 10/03/2018  . Abnormal ECG 07/05/2018  . Tachycardia 03/27/2018  . Pain  in limb 02/28/2018  . Chronic diastolic CHF (congestive heart failure) ([place]) 11/03/2017  . TIA (transient ischemic attack) 11/03/2017  . COPD suggested by initial evaluation ([place]) 08/23/2017  . Acute and chronic respiratory failure with hypoxia ([place]) 08/23/2017  . Postoperative state 08/15/2017  . Incidental pulmonary nodule 08/01/2017  . Non-ST elevation myocardial infarction (NSTEMI), subendocardial infarction, subsequent episode of care ([place]) 06/01/2017  . B12 deficiency 12/01/2016  . Vitamin D deficiency 11/04/2016  . Depression with anxiety 09/30/2016  . Paroxysmal A-fib ([place]) 09/30/2016  . Resting tremor 09/30/2016  . Mild obstructive sleep apnea 09/30/2016  . Headache 08/18/2016  . Unstable angina ([place]) 08/03/2016  . Type 2 diabetes mellitus with hyperlipidemia ([place]) 08/03/2016  . Asthma 11/11/2015  . S/P CABG x 4 11/10/2015  . Coronary artery disease 11/07/2015  . Carotid artery narrowing 02/08/2014  . Chest pain 02/08/2014  . Mixed hyperlipidemia 02/08/2014  . Atypical chest pain 02/07/2014  . Essential (primary) hypertension 02/07/2014  . Awareness of heartbeats 02/07/2014  . D (diarrhea) 10/05/2013  . Acid reflux 10/05/2013    Past Surgical History:  Procedure Laterality Date  . ABDOMINAL HYSTERECTOMY    . APPENDECTOMY  1975  . ARTERY BIOPSY Right 04/26/2016  Procedure: BIOPSY TEMPORAL ARTERY;  Surgeon: Margaretha Sheffield, MD;  Location: [facility] ORS;  Service: ENT;  Laterality: Right;  . CARDIAC CATHETERIZATION N/A 11/06/2015   Procedure: Left Heart Cath and Coronary Angiography;  Surgeon: Corey Skains, MD;  Location: [place] CV LAB;  Service: Cardiovascular;  Laterality: N/A;  . CESAREAN SECTION    . COLONOSCOPY  2015  . COLONOSCOPY WITH PROPOFOL N/A 11/03/2018   Procedure: COLONOSCOPY WITH PROPOFOL;  Surgeon: Virgel Manifold, MD;  Location: [facility] ENDOSCOPY;  Service: Endoscopy;  Laterality: N/A;  . CORONARY ANGIOPLASTY  04/2017   [place]  .  CORONARY ARTERY BYPASS GRAFT N/A 11/10/2015   Procedure: CORONARY ARTERY BYPASS GRAFTING (CABG), ON PUMP, TIMES FOUR, USING LEFT INTERNAL MAMMARY ARTERY, BILATERAL GREATER SAPHENOUS VEINS HARVESTED ENDOSCOPICALLY;  Surgeon: [name], MD;  Location: [place];  Service: Open Heart Surgery;  Laterality: N/A;  LIMA-LAD; SEQ SVG-OM1-OM2; SVG-PL  . CORONARY STENT INTERVENTION N/A 08/05/2016   Procedure: Coronary Stent Intervention;  Surgeon: Isaias Cowman, MD;  Location: [place] CV LAB;  Service: Cardiovascular;  Laterality: N/A;  . ESOPHAGOGASTRODUODENOSCOPY (EGD) WITH PROPOFOL N/A 11/03/2018   Procedure: ESOPHAGOGASTRODUODENOSCOPY (EGD) WITH PROPOFOL;  Surgeon: Virgel Manifold, MD;  Location: [facility];  Service: Endoscopy;  Laterality: N/A;  . HYSTERECTOMY ABDOMINAL WITH SALPINGO-OOPHORECTOMY Bilateral 08/15/2017   Procedure: HYSTERECTOMY ABDOMINAL WITH BILATERAL SALPINGO-OOPHORECTOMY;  Surgeon: Rubie Maid, MD;  Location: [facility] OR[facility];  Service: Gynecology;  Laterality: Bilateral;  . LEFT HEART CATH AND CORONARY ANGIOGRAPHY N/A 08/05/2016   Procedure: Left Heart Cath and Coronary Angiography;  Surgeon: Isaias Cowman, MD;  Location: [place] [facility];  Service: Cardiovascular;  Laterality: N/A;  . LEFT HEART CATH AND CORS/GRAFTS ANGIOGRAPHY N/A 09/19/2018   Procedure: LEFT HEART CATH AND CORS/GRAFTS ANGIOGRAPHY;  Surgeon: Corey Skains, MD;  Location: [place] [facility] LAB;  Service: Cardiovascular;  Laterality: N/A;  . OOPHORECTOMY    . TEE WITHOUT CARDIOVERSION N/A 11/10/2015   Procedure: TRANSESOPHAGEAL ECHOCARDIOGRAM (TEE);  Surgeon: [name], MD;  Location: [place];  Service: Open Heart Surgery;  Laterality: N/A;  . TUBAL LIGATION       OB History    Gravida  5   Para  5   Term  3   Preterm  2   AB      Living        SAB      TAB      Ectopic      Multiple      Live Births  2           Family History  Problem Relation Age of  Onset  . Cancer Father   . Hypertension Father   . Heart disease Father   . Asthma Father   . Cancer Mother   . Hypertension Mother   . Pancreatic cancer Mother 46  . Cancer Sister   . Breast cancer Sister 31  . Breast cancer Sister 16  . Lung cancer Brother   . Pancreatic cancer Sister 58  . Cancer Sister     Social History   Tobacco Use  . Smoking status: Former Smoker    Packs/day: 0.50    Types: Cigarettes    Quit date: 10/07/2001    Years since quitting: 17.6  . Smokeless tobacco: Never Used  Substance Use Topics  . Alcohol use: No    Alcohol/week: 0.0 standard drinks  . Drug use: No    Home Medications Prior to Admission medications  Medication Sig Start Date End Date Taking? Authorizing Provider  acetaminophen (TYLENOL) 325 MG tablet Take 650 mg by mouth every 6 (six) hours as needed for moderate pain or headache.    [provider]  atorvastatin (LIPITOR) 80 MG tablet Take 80 mg by mouth every evening.  07/06/18   [provider]  benzonatate (TESSALON) 200 MG capsule Take 1 capsule (200 mg total) by mouth 3 (three) times daily as needed for cough. 03/27/19   [name]., MD  [facility] ASPIRIN ADULT LOW DOSE 81 MG chewable tablet CHEW 1 TABLET (81 MG TOTAL) BY MOUTH DAILY. Patient taking differently: Chew 81 mg by mouth daily.  11/04/17   [name]., MD  famotidine (PEPCID) 40 MG tablet Take 40 mg by mouth daily.  09/14/18   [provider]  furosemide (LASIX) 20 MG tablet Take 20 mg by mouth every other day.     [provider]  Ipratropium-Albuterol (COMBIVENT RESPIMAT) 20-100 MCG/ACT AERS respimat Inhale 1 puff into the lungs every 6 (six) hours as needed for wheezing or shortness of breath. 03/08/19   Loletha Grayer, MD  isosorbide mononitrate (IMDUR) 60 MG 24 hr tablet Take 1 tablet (60 mg total) by mouth daily. 03/08/19   Loletha Grayer, MD  loperamide (IMODIUM) 2 MG capsule Take 2-4 mg by mouth as needed for  diarrhea or loose stools.     [provider]  meclizine (ANTIVERT) 12.5 MG tablet Take 1 tablet (12.5 mg total) by mouth 3 (three) times daily as needed for dizziness. 03/09/19   Loletha Grayer, MD  metFORMIN (GLUCOPHAGE) 1000 MG tablet TAKE 1 TABLET (1,000 MG TOTAL) BY MOUTH 2 (TWO) TIMES DAILY WITH A MEAL. 05/04/19   [name]., MD  metoprolol tartrate (LOPRESSOR) 100 MG tablet Take 100 mg by mouth 2 (two) times daily. 10/03/18   [provider]  nitroGLYCERIN (NITROSTAT) 0.4 MG SL tablet Place 1 tablet (0.4 mg total) under the tongue every 5 (five) minutes as needed for chest pain. 07/27/16   Bettey Costa, MD  ondansetron (ZOFRAN-ODT) 8 MG disintegrating tablet DISSOLVE 1 TABLET ON THE TONGUE EVERY 8 HOURS AS NEEDED FOR NAUSEA OR VOMITING 01/09/19   [name]., MD  pantoprazole (PROTONIX) 20 MG tablet Take 1 tablet (20 mg total) by mouth 2 (two) times daily. 07/18/18   [name]., MD  Semaglutide,0.25 or 0.5MG /DOS, (OZEMPIC, 0.25 OR 0.5 MG/DOSE,) 2 MG/1.5ML SOPN Inject 0.25 mg into the skin once a week. 01/04/19   [name]., MD  cetirizine (ZYRTEC) 5 MG tablet Take 2 tablets (10 mg total) by mouth daily. 12/19/18 03/06/19  [name]., MD    Allergies    Lisinopril and Penicillins  Review of Systems   Review of Systems  All other systems reviewed and are negative.   Physical Exam Updated Vital Signs BP (!) 186/132 (BP Location: Left Arm)   Pulse 93   Temp 98.6 F (37 C) (Oral)   Resp 18   Ht 5\' 2"  (1.575 m)   Wt 79.4 kg   SpO2 97%   BMI 32.01 kg/m   Physical Exam Vitals and nursing note reviewed.  Constitutional:      General: She is not in acute distress.    Appearance: She is well-developed. She is not diaphoretic.  HENT:     Head: Normocephalic and atraumatic.  Cardiovascular:     Rate and Rhythm: Normal rate and regular rhythm.  Heart sounds: No murmur. No friction rub. No gallop.   Pulmonary:      Effort: Pulmonary effort is normal. No respiratory distress.     Breath sounds: Normal breath sounds. No wheezing.  Abdominal:     General: Bowel sounds are normal. There is no distension.     Palpations: Abdomen is soft.     Tenderness: There is no abdominal tenderness.  Musculoskeletal:        General: Normal range of motion.     Cervical back: Normal range of motion and neck supple.     Right lower leg: No tenderness. No edema.     Left lower leg: No tenderness. No edema.  Skin:    General: Skin is warm and dry.  Neurological:     Mental Status: She is alert and oriented to person, place, and time.     ED Results / Procedures / Treatments   Labs (all labs ordered are listed, but only abnormal results are displayed) Labs Reviewed - No data to display  EKG EKG Interpretation  Date/Time:  Sunday June 10 2019 09:23:50 EDT Ventricular Rate:  99 PR Interval:    QRS Duration: 144 QT Interval:  362 QTC Calculation: 465 R Axis:   79 Text Interpretation: Sinus rhythm Left bundle branch block Confirmed by Veryl Speak [phone]) on 06/10/2019 9:40:08 AM   Radiology No results found.  Procedures Procedures (including critical care time)  Medications Ordered in ED Medications - No data to display  ED Course  I have reviewed the triage vital signs and the nursing notes.  Pertinent labs & imaging results that were available during my care of the patient were reviewed by me and considered in my medical decision making (see chart for details).    MDM Rules/Calculators/A&P  Patient is a 69 year old female with history of coronary artery disease with CABG presenting with complaints of chest discomfort.  This began yesterday.  Patient's EKG is unchanged and troponin x2 is negative.  She had no relief with nitroglycerin and her symptoms are atypical for cardiac pain.  Upon reviewing the patient's chart, she had a heart cath performed roughly 9 months ago showing patent  grafts.  At this point, I feel as though patient can safely be discharged with follow-up with her cardiologist.  She is feeling better after receiving Toradol.  I highly doubt a cardiac etiology.  Final Clinical Impression(s) / ED Diagnoses Final diagnoses:  None    Rx / DC Orders ED Discharge Orders    None       Veryl Speak, MD 06/10/19 1235

## 2019-06-11 NOTE — Progress Notes (Signed)
Patient: Candace [name] Female    DOB: Aug 19, 1950   69 y.o.   MRN: [id number] Visit Date: 06/12/2019  Today's Provider: [name], MD   Chief Complaint  Patient presents with  . Hospitalization Follow-up   Subjective:     HPI  Patient has had 3 ED visits since the first of the year for chest pain.  Work-up each time was negative. She has an appointment with cardiology in June.  3 days ago she had fatigue and palpitations.  2 days ago she had several moments of confusion and tightness in her chest.  Daughter thinks she had a TIA.   Follow up ER visit  Patient was seen in ER for Chest pain on 06/10/2019 . She was treated for Chest pain. Treatment for this included EKG, Labs. She reports good compliance with treatment. She reports this condition is Unchanged.  -----------------------------------------------------------  Patient states she feels no improvement since ER visit. Patient states she is still having pain in her chest/tightness. Patient states the Tramadol prescribed at the ER is not helping with the pain. Patient states she has dizziness and nauseous.  Allergies  Allergen Reactions  . Lisinopril Cough  . Penicillins Swelling, Rash and Other (See Comments)    Did it involve swelling of the face/tongue/throat, SOB, or low BP? Yes Did it involve sudden or severe rash/hives, skin peeling, or any reaction on the inside of your mouth or nose? Yes Did you need to seek medical attention at a hospital or doctor's office? Yes When did it last happen?15 years If all above answers are "NO", may proceed with cephalosporin use.      Current Outpatient Medications:  .  acetaminophen (TYLENOL) 325 MG tablet, Take 650 mg by mouth every 6 (six) hours as needed for moderate pain or headache., Disp: , Rfl:  .  atorvastatin (LIPITOR) 80 MG tablet, Take 80 mg by mouth every evening. , Disp: , Rfl:  .  [employer] ASPIRIN ADULT LOW DOSE 81 MG chewable tablet, CHEW 1  TABLET (81 MG TOTAL) BY MOUTH DAILY. (Patient taking differently: Chew 81 mg by mouth daily. ), Disp: 90 tablet, Rfl: 3 .  famotidine (PEPCID) 40 MG tablet, Take 40 mg by mouth daily. , Disp: , Rfl:  .  furosemide (LASIX) 20 MG tablet, Take 20 mg by mouth every other day. , Disp: , Rfl:  .  Ipratropium-Albuterol (COMBIVENT RESPIMAT) 20-100 MCG/ACT AERS respimat, Inhale 1 puff into the lungs every 6 (six) hours as needed for wheezing or shortness of breath., Disp: 4 g, Rfl: 0 .  isosorbide mononitrate (IMDUR) 60 MG 24 hr tablet, Take 1 tablet (60 mg total) by mouth daily., Disp: 30 tablet, Rfl: 0 .  loperamide (IMODIUM) 2 MG capsule, Take 2-4 mg by mouth as needed for diarrhea or loose stools. , Disp: , Rfl:  .  meclizine (ANTIVERT) 12.5 MG tablet, Take 1 tablet (12.5 mg total) by mouth 3 (three) times daily as needed for dizziness., Disp: 30 tablet, Rfl: 0 .  metFORMIN (GLUCOPHAGE) 1000 MG tablet, TAKE 1 TABLET (1,000 MG TOTAL) BY MOUTH 2 (TWO) TIMES DAILY WITH A MEAL., Disp: 180 tablet, Rfl: 0 .  metoprolol tartrate (LOPRESSOR) 100 MG tablet, Take 100 mg by mouth 2 (two) times daily., Disp: , Rfl:  .  nitroGLYCERIN (NITROSTAT) 0.4 MG SL tablet, Place 1 tablet (0.4 mg total) under the tongue every 5 (five) minutes as needed for chest pain., Disp: 30 tablet, Rfl: 0 .  ondansetron (ZOFRAN-ODT) 8 MG disintegrating tablet, DISSOLVE 1 TABLET ON THE TONGUE EVERY 8 HOURS AS NEEDED FOR NAUSEA OR VOMITING, Disp: 30 tablet, Rfl: 1 .  pantoprazole (PROTONIX) 20 MG tablet, Take 1 tablet (20 mg total) by mouth 2 (two) times daily., Disp: 60 tablet, Rfl: 11 .  Semaglutide,0.25 or 0.5MG /DOS, (OZEMPIC, 0.25 OR 0.5 MG/DOSE,) 2 MG/1.5ML SOPN, Inject 0.25 mg into the skin once a week., Disp: 4 pen, Rfl: 5 .  traMADol (ULTRAM) 50 MG tablet, Take 1 tablet (50 mg total) by mouth every 6 (six) hours as needed., Disp: 12 tablet, Rfl: 0 .  benzonatate (TESSALON) 200 MG capsule, Take 1 capsule (200 mg total) by mouth 3 (three)  times daily as needed for cough. (Patient not taking: Reported on 06/12/2019), Disp: 30 capsule, Rfl: 2  Review of Systems  Constitutional: Negative for appetite change, chills, fatigue and fever.  Eyes: Negative.   Respiratory: Negative for chest tightness and shortness of breath.   Cardiovascular: Positive for chest pain. Negative for palpitations.  Gastrointestinal: Negative for abdominal pain, nausea and vomiting.  Genitourinary: Positive for pelvic pain.  Musculoskeletal: Negative.   Allergic/Immunologic: Negative.   Neurological: Negative for dizziness and weakness.       See HPI.  Hematological: Negative.   Psychiatric/Behavioral: The patient is nervous/anxious.     Social History   Tobacco Use  . Smoking status: Former Smoker    Packs/day: 0.50    Types: Cigarettes    Quit date: 10/07/2001    Years since quitting: 17.6  . Smokeless tobacco: Never Used  Substance Use Topics  . Alcohol use: No    Alcohol/week: 0.0 standard drinks      Objective:   BP 119/75 (BP Location: Right Arm, Patient Position: Sitting, Cuff Size: Large)   Pulse 89   Temp (!) 96.8 F (36 C) (Other (Comment))   Resp 18   Ht 5\' 6"  (1.676 m)   Wt 171 lb (77.6 kg)   SpO2 98%   BMI 27.60 kg/m  Vitals:   06/12/19 0855  BP: 119/75  Pulse: 89  Resp: 18  Temp: (!) 96.8 F (36 C)  TempSrc: Other (Comment)  SpO2: 98%  Weight: 171 lb (77.6 kg)  Height: 5\' 6"  (1.676 m)  Body mass index is 27.6 kg/m.   Physical Exam Vitals reviewed.  Constitutional:      Appearance: She is obese.  HENT:     Head: Normocephalic and atraumatic.     Right Ear: External ear normal.     Left Ear: External ear normal.  Eyes:     General: No scleral icterus.    Conjunctiva/sclera: Conjunctivae normal.  Cardiovascular:     Rate and Rhythm: Normal rate and regular rhythm.     Heart sounds: Normal heart sounds.  Pulmonary:     Breath sounds: Normal breath sounds.  Abdominal:     Palpations: Abdomen is soft.      Comments: Minimal suprapubic tenderness  Musculoskeletal:     Comments: She does have some chest wall discomfort which reproduces the pain that she says she had.  Skin:    General: Skin is warm and dry.  Neurological:     General: No focal deficit present.     Mental Status: She is alert and oriented to person, place, and time.  Psychiatric:        Mood and Affect: Mood normal.        Behavior: Behavior normal.  Thought Content: Thought content normal.        Judgment: Judgment normal.      No results found for any visits on 06/12/19.     Assessment & Plan    1. Type 2 diabetes mellitus with hyperlipidemia (HCC) Last A1c is 9.5 and this is 10.2.  I am reluctant to use insulin on this patient.  Consider just glipizide. - POCT glycosylated hemoglobin (Hb A1C) 10.2 today.  2. Acute cystitis with hematuria Patient will bring urine sample back by office. Unable to urinate at visit.  Mild suprapubic discomfort. - POCT urinalysis dipstick-Positive.   3. Adjustment disorder, unspecified type Try sertraline 50 mg daily.  I think this is the reason for the ED visits.  She also has some costochondritis found today.  Avoid anti-inflammatories. GAD-7 is 2 PHQ-9 however is 13. - sertraline (ZOLOFT) 50 MG tablet; Take 1 tablet (50 mg total) by mouth daily.  Dispense: 30 tablet; Refill: 0  4. TIA (transient ischemic attack) The daughter thinks that her symptoms and confusion could have been from tramadol.  Will obtain carotid Dopplers for completeness - US Carotid Bilateral   Follow up in one month.    I,Grenda Lora,acting as a scribe for [name], MD.,have documented all relevant documentation on the behalf of [name], MD,as directed by  [name], MD while in the presence of [name], MD.      [name], MD  [facility]

## 2019-06-12 ENCOUNTER — Encounter: Payer: Self-pay | Admitting: Family Medicine

## 2019-06-12 ENCOUNTER — Ambulatory Visit (INDEPENDENT_AMBULATORY_CARE_PROVIDER_SITE_OTHER): Payer: Medicare Other | Admitting: Family Medicine

## 2019-06-12 ENCOUNTER — Other Ambulatory Visit: Payer: Self-pay

## 2019-06-12 VITALS — BP 119/75 | HR 89 | Temp 96.8°F | Resp 18 | Ht 66.0 in | Wt 171.0 lb

## 2019-06-12 DIAGNOSIS — N3001 Acute cystitis with hematuria: Secondary | ICD-10-CM | POA: Diagnosis not present

## 2019-06-12 DIAGNOSIS — F4320 Adjustment disorder, unspecified: Secondary | ICD-10-CM

## 2019-06-12 DIAGNOSIS — E1169 Type 2 diabetes mellitus with other specified complication: Secondary | ICD-10-CM | POA: Diagnosis not present

## 2019-06-12 DIAGNOSIS — E785 Hyperlipidemia, unspecified: Secondary | ICD-10-CM

## 2019-06-12 DIAGNOSIS — G459 Transient cerebral ischemic attack, unspecified: Secondary | ICD-10-CM | POA: Diagnosis not present

## 2019-06-12 LAB — POCT URINALYSIS DIPSTICK
Appearance: ABNORMAL
Appearance: ABNORMAL
Bilirubin, UA: POSITIVE
Bilirubin, UA: NEGATIVE
Glucose, UA: POSITIVE — AB
Glucose, UA: POSITIVE — AB
Nitrite, UA: NEGATIVE
Nitrite, UA: NEGATIVE
Odor: NORMAL
Odor: NORMAL
Protein, UA: POSITIVE — AB
Protein, UA: POSITIVE — AB
Spec Grav, UA: >=1.03 — AB (ref 1.010–1.025)
Spec Grav, UA: >=1.03 — AB (ref 1.010–1.025)
Urobilinogen, UA: 0.2 E.U./dL
Urobilinogen, UA: 0.2 E.U./dL
pH, UA: 6 (ref 5.0–8.0)
pH, UA: 6 (ref 5.0–8.0)

## 2019-06-12 LAB — POCT GLYCOSYLATED HEMOGLOBIN (HGB A1C)
Est. average glucose Bld gHb Est-mCnc: 246
Hemoglobin A1C: 10.2 % — AB (ref 4.0–5.6)

## 2019-06-12 MED ORDER — SERTRALINE HCL 50 MG PO TABS: 50.0000 mg | ORAL_TABLET | Freq: Every day | ORAL | 0 refills | Status: DC

## 2019-06-15 LAB — CULTURE, URINE COMPREHENSIVE

## 2019-06-19 ENCOUNTER — Telehealth: Payer: Self-pay

## 2019-06-19 DIAGNOSIS — R350 Frequency of micturition: Secondary | ICD-10-CM

## 2019-06-19 MED ORDER — NITROFURANTOIN MONOHYD MACRO 100 MG PO CAPS: 100.0000 mg | ORAL_CAPSULE | Freq: Two times a day (BID) | ORAL | 0 refills | Status: DC

## 2019-06-19 NOTE — Telephone Encounter (Signed)
Patient was advised and states that she is having urinating frequency and some incontinence. Medication was send into pharmacy per patients approval.

## 2019-06-19 NOTE — Telephone Encounter (Signed)
-----   Message from [name]., MD sent at 06/18/2019  8:11 AM EDT ----- I do not think this is a UTI but simple colonization.  If she is having any urinary symptoms such as dysuria or frequency or incontinence would treat with nitrofurantoin 100 mg twice a day for 5 days.

## 2019-06-20 ENCOUNTER — Ambulatory Visit
Admission: RE | Admit: 2019-06-20 | Discharge: 2019-06-20 | Disposition: A | Discharge status: Home / Self Care | Payer: Medicare Other | Source: Ambulatory Visit | Attending: Family Medicine | Admitting: Family Medicine

## 2019-06-20 ENCOUNTER — Other Ambulatory Visit: Payer: Self-pay

## 2019-06-20 DIAGNOSIS — G459 Transient cerebral ischemic attack, unspecified: Secondary | ICD-10-CM | POA: Diagnosis present

## 2019-06-21 ENCOUNTER — Telehealth: Payer: Self-pay

## 2019-06-21 NOTE — Telephone Encounter (Signed)
Patient advised.

## 2019-06-21 NOTE — Telephone Encounter (Signed)
-----   Message from [name]., MD sent at 06/20/2019  3:58 PM EDT ----- No severe blockage on carotid Dopplers

## 2019-06-28 LAB — HM DIABETES EYE EXAM

## 2019-07-04 ENCOUNTER — Encounter: Payer: Self-pay | Admitting: *Deleted

## 2019-07-04 ENCOUNTER — Other Ambulatory Visit: Payer: Self-pay | Admitting: Family Medicine

## 2019-07-04 DIAGNOSIS — F4320 Adjustment disorder, unspecified: Secondary | ICD-10-CM

## 2019-07-04 NOTE — Telephone Encounter (Signed)
Request for 90 day supply of new Rx requested- patient has follow up visit scheduled 07/12/19 for this medication. Sent for PCP review

## 2019-07-10 ENCOUNTER — Other Ambulatory Visit: Payer: Self-pay | Admitting: Family Medicine

## 2019-07-10 DIAGNOSIS — E1159 Type 2 diabetes mellitus with other circulatory complications: Secondary | ICD-10-CM

## 2019-07-10 LAB — HEMOGLOBIN A1C: Hemoglobin A1C: 9.2

## 2019-07-10 NOTE — Telephone Encounter (Signed)
Requested medication (s) are due for refill today: yes  Requested medication (s) are on the active medication list: yes  Last refill:  03/09/2019  Future visit scheduled: yes  Notes to clinic: this refill cannot be delegated    Requested Prescriptions  Pending Prescriptions Disp Refills   ondansetron (ZOFRAN-ODT) 8 MG disintegrating tablet [Pharmacy Med Name: ONDANSETRON ODT 8 MG TABLET] 30 tablet 1    Sig: DISSOLVE 1 TABLET ON THE TONGUE EVERY 8 HOURS AS NEEDED FOR NAUSEA OR VOMITING      Not Delegated - Gastroenterology: Antiemetics Failed - 07/10/2019 11:14 AM      Failed - This refill cannot be delegated      Passed - Valid encounter within last 6 months    Recent Outpatient Visits           4 weeks ago Type 2 diabetes mellitus with hyperlipidemia [facility])   [facility] [name]., MD   4 months ago Acute bronchitis, unspecified organism   [place], [place], [place]   4 months ago Viral upper respiratory tract infection   [place], Kelby Aline, FNP   6 months ago Gallbladder attack   [facility] [name]., MD   6 months ago Type 2 diabetes mellitus with other circulatory complication, without long-term current use of insulin [facility])   [facility] [name]., MD       Future Appointments             In 2 days [name]., MD [facility], [place]   In 1 month [name]., MD [facility]             Signed Prescriptions Disp Refills   Lancets (ONETOUCH DELICA PLUS [id number]) [place] 100 each 0    Sig: USE UP TO 4 TIMES DAILY AS DIRECTED      Endocrinology: Diabetes - Testing Supplies Passed - 07/10/2019 11:14 AM      Passed - Valid encounter within last 12 months    Recent Outpatient Visits           4 weeks ago Type 2 diabetes mellitus with hyperlipidemia [facility])   [facility] [name]., MD   4 months ago Acute bronchitis, unspecified organism   [place], [place], [place]   4 months ago Viral upper respiratory tract infection   [facility] [place], Kelby Aline, FNP   6 months ago Gallbladder attack   [facility] [name]., MD   6 months ago Type 2 diabetes mellitus with other circulatory complication, without long-term current use of insulin [facility])   [facility] [name]., MD       Future Appointments             In 2 days [name]., MD [facility], [place]   In 1 month [name]., MD [facility], [facility]

## 2019-07-11 NOTE — Progress Notes (Signed)
Established patient visit      I,Candace [name],acting as a scribe for [name], MD.,have documented all relevant documentation on the behalf of [name], MD,as directed by  [name], MD while in the presence of [name], MD.  Patient: Candace [name]   DOB: Jul 28, 1950   69 y.o. Female  MRN: [id number] Visit Date: 07/12/2019  Today's healthcare provider: [name], MD  Subjective:    Chief Complaint  Patient presents with  . Follow-up  . Diabetes   HPI  Patient is feeling much better.  She is tolerating sertraline well. Diabetes Mellitus Type II, Follow-up  Lab Results  Component Value Date   HGBA1C 9.2 07/10/2019   HGBA1C 10.2 (A) 06/12/2019   HGBA1C 9.5 (H) 03/06/2019   Last seen for diabetes 1 months ago.  Management since then includes; Last A1c is 9.5 and this is 10.2.  I am reluctant to use insulin on this patient.  Consider just glipizide. She reports good compliance with treatment. She is not having side effects. none Symptoms: Yes fatigue No foot ulcerations No appetite changes No nausea Yes paresthesia (numbness or tingling) of the feet  No polydipsia (excessive thirst) Yes polyuria (frequent urination) No visual disturbances  No vomiting  Home blood sugar records: fasting range: 200  Episodes of hypoglycemia? No none   Current insulin regiment: n/a Most Recent Eye Exam: 06/28/2019 Current exercise: none Current diet habits: in general, a "healthy" diet  , well balanced  Pertinent Labs:    Component Value Date/Time   CHOL 214 (H) 01/03/2019 0957   CHOL 214 (H) 07/14/2013 0542   TRIG 77 01/03/2019 0957   TRIG 118 07/14/2013 0542   HDL 53 01/03/2019 0957   HDL 36 (L) 07/14/2013 0542   LDLCALC 147 (H) 01/03/2019 0957   LDLCALC 154 (H) 07/14/2013 0542   NA 137 06/10/2019 0954   NA 143 01/12/2019 0955   NA 137 04/29/2014 2042   K 3.5 06/10/2019 0954   K 3.5 04/29/2014 2042   ALT 14 06/10/2019  0954   ALT 22 04/29/2014 2042   AST 19 06/10/2019 0954   AST 18 04/29/2014 2042   CREATININE 0.60 06/10/2019 0954   CREATININE 1.39 (H) 03/24/2017 1638   Wt Readings from Last 3 Encounters:  07/12/19 176 lb (79.8 kg)  06/12/19 171 lb (77.6 kg)  06/10/19 175 lb (79.4 kg)    --------------------------------------------------------------------   Adjustment disorder, unspecified type From 06/12/2019-Try sertraline 50 mg daily.  I think this is the reason for the ED visits.  She also has some costochondritis found today.  Avoid anti-inflammatories. GAD-7 is 2 PHQ-9 however is 13.  TIA (transient ischemic attack) From 06/12/2019-The daughter thinks that her symptoms and confusion could have been from tramadol.  Will obtain carotid Dopplers for completeness      Medications: Outpatient Medications Prior to Visit  Medication Sig  . acetaminophen (TYLENOL) 325 MG tablet Take 650 mg by mouth every 6 (six) hours as needed for moderate pain or headache.  . CVS ASPIRIN ADULT LOW DOSE 81 MG chewable tablet CHEW 1 TABLET (81 MG TOTAL) BY MOUTH DAILY. (Patient taking differently: Chew 81 mg by mouth daily. )  . famotidine (PEPCID) 40 MG tablet Take 40 mg by mouth daily.   . furosemide (LASIX) 20 MG tablet Take 20 mg by mouth every other day.   . isosorbide mononitrate (IMDUR) 60 MG 24 hr tablet Take 1 tablet (60 mg total)  by mouth daily.  . Lancets (ONETOUCH DELICA PLUS [id number]) MISC USE UP TO 4 TIMES DAILY AS DIRECTED  . loperamide (IMODIUM) 2 MG capsule Take 2-4 mg by mouth as needed for diarrhea or loose stools.   . meclizine (ANTIVERT) 12.5 MG tablet Take 1 tablet (12.5 mg total) by mouth 3 (three) times daily as needed for dizziness.  . metFORMIN (GLUCOPHAGE) 1000 MG tablet TAKE 1 TABLET (1,000 MG TOTAL) BY MOUTH 2 (TWO) TIMES DAILY WITH A MEAL.  . metoprolol tartrate (LOPRESSOR) 100 MG tablet Take 100 mg by mouth 2 (two) times daily.  . nitroGLYCERIN (NITROSTAT) 0.4 MG SL tablet Place 1  tablet (0.4 mg total) under the tongue every 5 (five) minutes as needed for chest pain.  Marland Kitchen ondansetron (ZOFRAN-ODT) 8 MG disintegrating tablet DISSOLVE 1 TABLET ON THE TONGUE EVERY 8 HOURS AS NEEDED FOR NAUSEA OR VOMITING  . sertraline (ZOLOFT) 50 MG tablet TAKE 1 TABLET BY MOUTH EVERY DAY  . atorvastatin (LIPITOR) 80 MG tablet Take 80 mg by mouth every evening.   . benzonatate (TESSALON) 200 MG capsule Take 1 capsule (200 mg total) by mouth 3 (three) times daily as needed for cough. (Patient not taking: Reported on 06/12/2019)  . Ipratropium-Albuterol (COMBIVENT RESPIMAT) 20-100 MCG/ACT AERS respimat Inhale 1 puff into the lungs every 6 (six) hours as needed for wheezing or shortness of breath. (Patient not taking: Reported on 07/12/2019)  . nitrofurantoin, macrocrystal-monohydrate, (MACROBID) 100 MG capsule Take 1 capsule (100 mg total) by mouth 2 (two) times daily. (Patient not taking: Reported on 07/12/2019)  . pantoprazole (PROTONIX) 20 MG tablet Take 1 tablet (20 mg total) by mouth 2 (two) times daily. (Patient not taking: Reported on 07/12/2019)  . Semaglutide,0.25 or 0.5MG /DOS, (OZEMPIC, 0.25 OR 0.5 MG/DOSE,) 2 MG/1.5ML SOPN Inject 0.25 mg into the skin once a week. (Patient not taking: Reported on 07/12/2019)  . traMADol (ULTRAM) 50 MG tablet Take 1 tablet (50 mg total) by mouth every 6 (six) hours as needed. (Patient not taking: Reported on 07/12/2019)   No facility-administered medications prior to visit.    Review of Systems  Constitutional: Negative for appetite change, chills, fatigue and fever.  HENT: Negative.   Eyes: Negative.   Respiratory: Negative for chest tightness and shortness of breath.   Cardiovascular: Negative for chest pain and palpitations.  Gastrointestinal: Negative for abdominal pain, nausea and vomiting.  Endocrine: Negative.   Allergic/Immunologic: Negative.   Neurological: Negative for dizziness and weakness.  Hematological: Negative.   Psychiatric/Behavioral:  Negative.     Last hemoglobin A1c Lab Results  Component Value Date   HGBA1C 9.2 07/10/2019        Objective:    BP 139/84 (BP Location: Right Arm, Patient Position: Sitting, Cuff Size: Large)   Pulse 84   Temp (!) 97.3 F (36.3 C) (Other (Comment))   Resp 18   Ht 5\' 6"  (1.676 m)   Wt 176 lb (79.8 kg)   SpO2 98%   BMI 28.41 kg/m  BP Readings from Last 3 Encounters:  07/12/19 139/84  06/12/19 119/75  06/10/19 (!) 183/104   Wt Readings from Last 3 Encounters:  07/12/19 176 lb (79.8 kg)  06/12/19 171 lb (77.6 kg)  06/10/19 175 lb (79.4 kg)     Depression screen PHQ 2/9 07/12/2019  Decreased Interest 1  Down, Depressed, Hopeless 0  PHQ - 2 Score 1  Altered sleeping 1  Tired, decreased energy 1  Change in appetite 0  Feeling bad or failure about  yourself  0  Trouble concentrating 0  Moving slowly or fidgety/restless 0  Suicidal thoughts 0  PHQ-9 Score 3  Difficult doing work/chores Not difficult at all  Some recent data might be hidden   GAD 7 : Generalized Anxiety Score 07/12/2019 06/12/2019  Nervous, Anxious, on Edge 0 0  Control/stop worrying 0 0  Worry too much - different things 0 0  Trouble relaxing 0 1  Restless 0 0  Easily annoyed or irritable 1 1  Afraid - awful might happen 0 0  Total GAD 7 Score 1 2  Anxiety Difficulty Not difficult at all Not difficult at all      Physical Exam Constitutional:      Appearance: Normal appearance.  HENT:     Head: Normocephalic and atraumatic.     Right Ear: External ear normal.     Left Ear: External ear normal.  Eyes:     General: No scleral icterus.    Conjunctiva/sclera: Conjunctivae normal.  Cardiovascular:     Rate and Rhythm: Normal rate and regular rhythm.     Heart sounds: Normal heart sounds.  Pulmonary:     Effort: Pulmonary effort is normal.     Breath sounds: Normal breath sounds.  Musculoskeletal:        General: Normal range of motion.     Cervical back: Normal range of motion and neck  supple.     Right lower leg: No edema.     Left lower leg: No edema.  Skin:    General: Skin is warm and dry.  Neurological:     Mental Status: She is alert and oriented to person, place, and time. Mental status is at baseline.  Psychiatric:        Mood and Affect: Mood normal.        Behavior: Behavior normal.        Thought Content: Thought content normal.        Judgment: Judgment normal.       Results for orders placed or performed in visit on 07/12/19  Hemoglobin A1c  Result Value Ref Range   Hemoglobin A1C 9.2       Assessment & Plan:    1. Type 2 diabetes mellitus with hyperlipidemia (HCC) A1c was done a month ago was 9.2 through her insurance.  This is improving.  Return to clinic 3 months.  A1c at that time.  2. Depression with anxiety Improved.  GAD is gone from 2-1 and PHQ-9 has gone from 13-3.  Continue sertraline indefinitely.  3. Coronary artery disease of bypass graft of native heart with stable angina pectoris ([place]) All risk factors treated  4.  Essential hypertension  5. Atypical chest pain I think this is possibly due to her depression and worsened by reflux.  Continue all her meds until later in the year.  6. Gastroesophageal reflux disease, unspecified whether esophagitis present Continue Protonix and Pepcid.  Consider stopping Pepcid on her next visit.  Follow up in 3 months.     No follow-ups on file.      Maxamus Colao Cranford Mon, MD  [facility] [phone] (phone) [phone] (fax)  [place]

## 2019-07-12 ENCOUNTER — Other Ambulatory Visit: Payer: Self-pay

## 2019-07-12 ENCOUNTER — Encounter: Payer: Self-pay | Admitting: Family Medicine

## 2019-07-12 ENCOUNTER — Ambulatory Visit (INDEPENDENT_AMBULATORY_CARE_PROVIDER_SITE_OTHER): Payer: Medicare Other | Admitting: Family Medicine

## 2019-07-12 VITALS — BP 139/84 | HR 84 | Temp 97.3°F | Resp 18 | Ht 66.0 in | Wt 176.0 lb

## 2019-07-12 DIAGNOSIS — I25708 Atherosclerosis of coronary artery bypass graft(s), unspecified, with other forms of angina pectoris: Secondary | ICD-10-CM | POA: Diagnosis not present

## 2019-07-12 DIAGNOSIS — E1169 Type 2 diabetes mellitus with other specified complication: Secondary | ICD-10-CM

## 2019-07-12 DIAGNOSIS — F418 Other specified anxiety disorders: Secondary | ICD-10-CM

## 2019-07-12 DIAGNOSIS — I214 Non-ST elevation (NSTEMI) myocardial infarction: Secondary | ICD-10-CM

## 2019-07-12 DIAGNOSIS — R0789 Other chest pain: Secondary | ICD-10-CM

## 2019-07-12 DIAGNOSIS — E785 Hyperlipidemia, unspecified: Secondary | ICD-10-CM

## 2019-07-12 DIAGNOSIS — K219 Gastro-esophageal reflux disease without esophagitis: Secondary | ICD-10-CM

## 2019-07-12 NOTE — Patient Instructions (Signed)
For acid reflux: stay on both Pepcid and Protonix for now.

## 2019-08-07 ENCOUNTER — Other Ambulatory Visit: Payer: Self-pay | Admitting: Family Medicine

## 2019-08-07 DIAGNOSIS — E118 Type 2 diabetes mellitus with unspecified complications: Secondary | ICD-10-CM

## 2019-08-07 NOTE — Telephone Encounter (Signed)
Requested Prescriptions  Pending Prescriptions Disp Refills  . metFORMIN (GLUCOPHAGE) 1000 MG tablet [Pharmacy Med Name: METFORMIN HCL 1,000 MG TABLET] 180 tablet 0    Sig: TAKE 1 TABLET (1,000 MG TOTAL) BY MOUTH 2 (TWO) TIMES DAILY WITH A MEAL.     Endocrinology:  Diabetes - Biguanides Failed - 08/07/2019  1:57 AM      Failed - HBA1C is between 0 and 7.9 and within 180 days    Hemoglobin A1C  Date Value Ref Range Status  07/10/2019 9.2  Final         Passed - Cr in normal range and within 360 days    Creat  Date Value Ref Range Status  03/24/2017 1.39 (H) 0.50 - 0.99 mg/dL Final    Comment:    For patients >63 years of age, the reference limit for Creatinine is approximately 13% higher for people identified as African-American. .    Creatinine, Ser  Date Value Ref Range Status  06/10/2019 0.60 0.44 - 1.00 mg/dL Final         Passed - eGFR in normal range and within 360 days    EGFR (African American)  Date Value Ref Range Status  04/29/2014 >60 >51m/min Final  09/16/2013 >60  Final   GFR calc Af Amer  Date Value Ref Range Status  06/10/2019 >60 >60 mL/min Final   EGFR (Non-African Amer.)  Date Value Ref Range Status  04/29/2014 >60 >622mmin Final    Comment:    eGFR values <6048min/1.73 m2 may be an indication of chronic kidney disease (CKD). Calculated eGFR, using the MRDR Study equation, is useful in  patients with stable renal function. The eGFR calculation will not be reliable in acutely ill patients when serum creatinine is changing rapidly. It is not useful in patients on dialysis. The eGFR calculation may not be applicable to patients at the low and high extremes of body sizes, pregnant women, and vegetarians.   09/16/2013 >60  Final    Comment:    eGFR values <87m64mn/1.73 m2 may be an indication of chronic kidney disease (CKD). Calculated eGFR is useful in patients with stable renal function. The eGFR calculation will not be reliable in acutely  ill patients when serum creatinine is changing rapidly. It is not useful in  patients on dialysis. The eGFR calculation may not be applicable to patients at the low and high extremes of body sizes, pregnant women, and vegetarians.    GFR calc non Af Amer  Date Value Ref Range Status  06/10/2019 >60 >60 mL/min Final         Passed - Valid encounter within last 6 months    Recent Outpatient Visits          3 weeks ago [facility] 2 [facility] with [facility]   1 month ago [facility] with [facility]   4 months ago Acute bronchitis, unspecified organism   [place] Viral upper respiratory tract infection   [facility] [place]   6 months ago Gallbladder attack   [facility]      Future Appointments            In 4 weeks [facility]   In 2 months  [name]., MD [facility], [place]

## 2019-08-28 ENCOUNTER — Ambulatory Visit: Payer: Medicaid Other | Admitting: Family Medicine

## 2019-08-29 ENCOUNTER — Other Ambulatory Visit: Payer: Self-pay

## 2019-08-29 ENCOUNTER — Emergency Department: Payer: Medicare Other

## 2019-08-29 ENCOUNTER — Encounter: Payer: Self-pay | Admitting: Intensive Care

## 2019-08-29 DIAGNOSIS — I10 Essential (primary) hypertension: Secondary | ICD-10-CM | POA: Insufficient documentation

## 2019-08-29 DIAGNOSIS — I252 Old myocardial infarction: Secondary | ICD-10-CM | POA: Diagnosis not present

## 2019-08-29 DIAGNOSIS — I480 Paroxysmal atrial fibrillation: Secondary | ICD-10-CM | POA: Diagnosis not present

## 2019-08-29 DIAGNOSIS — R0789 Other chest pain: Secondary | ICD-10-CM | POA: Diagnosis not present

## 2019-08-29 DIAGNOSIS — K219 Gastro-esophageal reflux disease without esophagitis: Secondary | ICD-10-CM | POA: Insufficient documentation

## 2019-08-29 DIAGNOSIS — Z7982 Long term (current) use of aspirin: Secondary | ICD-10-CM | POA: Diagnosis not present

## 2019-08-29 DIAGNOSIS — Z8673 Personal history of transient ischemic attack (TIA), and cerebral infarction without residual deficits: Secondary | ICD-10-CM | POA: Insufficient documentation

## 2019-08-29 DIAGNOSIS — Z951 Presence of aortocoronary bypass graft: Secondary | ICD-10-CM | POA: Diagnosis not present

## 2019-08-29 DIAGNOSIS — E119 Type 2 diabetes mellitus without complications: Secondary | ICD-10-CM | POA: Insufficient documentation

## 2019-08-29 DIAGNOSIS — E785 Hyperlipidemia, unspecified: Secondary | ICD-10-CM | POA: Diagnosis not present

## 2019-08-29 DIAGNOSIS — Z79899 Other long term (current) drug therapy: Secondary | ICD-10-CM | POA: Insufficient documentation

## 2019-08-29 DIAGNOSIS — Z880 Allergy status to penicillin: Secondary | ICD-10-CM | POA: Diagnosis not present

## 2019-08-29 DIAGNOSIS — Z888 Allergy status to other drugs, medicaments and biological substances status: Secondary | ICD-10-CM | POA: Insufficient documentation

## 2019-08-29 DIAGNOSIS — I25110 Atherosclerotic heart disease of native coronary artery with unstable angina pectoris: Principal | ICD-10-CM | POA: Insufficient documentation

## 2019-08-29 DIAGNOSIS — Z7984 Long term (current) use of oral hypoglycemic drugs: Secondary | ICD-10-CM | POA: Diagnosis not present

## 2019-08-29 DIAGNOSIS — Z20822 Contact with and (suspected) exposure to covid-19: Secondary | ICD-10-CM | POA: Diagnosis not present

## 2019-08-29 DIAGNOSIS — F329 Major depressive disorder, single episode, unspecified: Secondary | ICD-10-CM | POA: Diagnosis not present

## 2019-08-29 DIAGNOSIS — Z955 Presence of coronary angioplasty implant and graft: Secondary | ICD-10-CM | POA: Diagnosis not present

## 2019-08-29 DIAGNOSIS — F419 Anxiety disorder, unspecified: Secondary | ICD-10-CM | POA: Insufficient documentation

## 2019-08-29 DIAGNOSIS — I200 Unstable angina: Secondary | ICD-10-CM | POA: Diagnosis not present

## 2019-08-29 LAB — BASIC METABOLIC PANEL
Anion gap: 7 (ref 5–15)
BUN: 12 mg/dL (ref 8–23)
CO2: 27 mmol/L (ref 22–32)
Calcium: 9.3 mg/dL (ref 8.9–10.3)
Chloride: 103 mmol/L (ref 98–111)
Creatinine, Ser: 0.75 mg/dL (ref 0.44–1.00)
GFR calc Af Amer: >60 mL/min (ref >60)
GFR calc non Af Amer: >60 mL/min (ref >60)
Glucose, Bld: 220 mg/dL — ABNORMAL HIGH (ref 70–99)
Potassium: 3.8 mmol/L (ref 3.5–5.1)
Sodium: 137 mmol/L (ref 135–145)

## 2019-08-29 LAB — CBC
HCT: 40.5 % (ref 36.0–46.0)
Hemoglobin: 13.9 g/dL (ref 12.0–15.0)
MCH: 31.7 pg (ref 26.0–34.0)
MCHC: 34.3 g/dL (ref 30.0–36.0)
MCV: 92.3 fL (ref 80.0–100.0)
Platelets: 229 10*3/uL (ref 150–400)
RBC: 4.39 MIL/uL (ref 3.87–5.11)
RDW: 12.5 % (ref 11.5–15.5)
WBC: 7.3 10*3/uL (ref 4.0–10.5)
nRBC: 0 % (ref 0.0–0.2)

## 2019-08-29 LAB — TROPONIN I (HIGH SENSITIVITY)
Troponin I (High Sensitivity): 9 ng/L (ref <18)
Troponin I (High Sensitivity): 8 ng/L (ref <18)

## 2019-08-29 NOTE — ED Triage Notes (Signed)
First Nurse Note:  C/o chest pain.  Patient states pain has been ongoing "for a while".  Sent to ED by Dr. Gigi Gin.  Patient is AAOx3.  Skin warm and dry. NAD.  No SOB/ DOE.

## 2019-08-29 NOTE — ED Triage Notes (Signed)
Patient c/o central chest tightness with radiation to left chest and right upper back. Reports this has been intermittent for weeks. Sent to ER by Dr. Gigi Gin.

## 2019-08-30 ENCOUNTER — Other Ambulatory Visit: Payer: Self-pay

## 2019-08-30 ENCOUNTER — Encounter: Payer: Self-pay | Admitting: Anesthesiology

## 2019-08-30 ENCOUNTER — Observation Stay
Admission: EM | Admit: 2019-08-30 | Discharge: 2019-08-31 | Disposition: A | Discharge status: Home / Self Care | Payer: Medicare Other | Attending: Internal Medicine | Admitting: Internal Medicine

## 2019-08-30 ENCOUNTER — Encounter
Admission: EM | Disposition: A | Discharge status: Home / Self Care | Payer: Self-pay | Source: Home / Self Care | Attending: Emergency Medicine

## 2019-08-30 ENCOUNTER — Encounter: Payer: Self-pay | Admitting: Family Medicine

## 2019-08-30 ENCOUNTER — Observation Stay
Admit: 2019-08-30 | Discharge: 2019-08-30 | Disposition: A | Discharge status: Home / Self Care | Payer: Medicare Other | Attending: Family Medicine | Admitting: Family Medicine

## 2019-08-30 DIAGNOSIS — E785 Hyperlipidemia, unspecified: Secondary | ICD-10-CM

## 2019-08-30 DIAGNOSIS — E119 Type 2 diabetes mellitus without complications: Secondary | ICD-10-CM | POA: Diagnosis not present

## 2019-08-30 DIAGNOSIS — I2510 Atherosclerotic heart disease of native coronary artery without angina pectoris: Secondary | ICD-10-CM

## 2019-08-30 DIAGNOSIS — R079 Chest pain, unspecified: Secondary | ICD-10-CM | POA: Diagnosis not present

## 2019-08-30 DIAGNOSIS — Z794 Long term (current) use of insulin: Secondary | ICD-10-CM

## 2019-08-30 DIAGNOSIS — I200 Unstable angina: Secondary | ICD-10-CM | POA: Diagnosis not present

## 2019-08-30 DIAGNOSIS — I10 Essential (primary) hypertension: Secondary | ICD-10-CM

## 2019-08-30 DIAGNOSIS — I25110 Atherosclerotic heart disease of native coronary artery with unstable angina pectoris: Secondary | ICD-10-CM | POA: Diagnosis not present

## 2019-08-30 DIAGNOSIS — E1142 Type 2 diabetes mellitus with diabetic polyneuropathy: Secondary | ICD-10-CM

## 2019-08-30 DIAGNOSIS — E118 Type 2 diabetes mellitus with unspecified complications: Secondary | ICD-10-CM

## 2019-08-30 HISTORY — PX: LEFT HEART CATH AND CORS/GRAFTS ANGIOGRAPHY: CATH118250

## 2019-08-30 LAB — PROTIME-INR
INR: 1 (ref 0.8–1.2)
Prothrombin Time: 12.6 seconds (ref 11.4–15.2)

## 2019-08-30 LAB — GLUCOSE, CAPILLARY
Glucose-Capillary: 149 mg/dL — ABNORMAL HIGH (ref 70–99)
Glucose-Capillary: 125 mg/dL — ABNORMAL HIGH (ref 70–99)
Glucose-Capillary: 119 mg/dL — ABNORMAL HIGH (ref 70–99)
Glucose-Capillary: 214 mg/dL — ABNORMAL HIGH (ref 70–99)

## 2019-08-30 LAB — SARS CORONAVIRUS 2 BY RT PCR (HOSPITAL ORDER, PERFORMED IN ~~LOC~~ HOSPITAL LAB): SARS Coronavirus 2: NEGATIVE

## 2019-08-30 LAB — HEPATIC FUNCTION PANEL
ALT: 15 U/L (ref 0–44)
AST: 21 U/L (ref 15–41)
Albumin: 4.2 g/dL (ref 3.5–5.0)
Alkaline Phosphatase: 98 U/L (ref 38–126)
Bilirubin, Direct: <0.1 mg/dL (ref 0.0–0.2)
Total Bilirubin: 0.6 mg/dL (ref 0.3–1.2)
Total Protein: 7 g/dL (ref 6.5–8.1)

## 2019-08-30 LAB — TROPONIN I (HIGH SENSITIVITY): Troponin I (High Sensitivity): 8 ng/L (ref <18)

## 2019-08-30 LAB — HEMOGLOBIN A1C
Hgb A1c MFr Bld: 9.8 % — ABNORMAL HIGH (ref 4.8–5.6)
Mean Plasma Glucose: 234.56 mg/dL

## 2019-08-30 LAB — ECHOCARDIOGRAM COMPLETE

## 2019-08-30 LAB — APTT: aPTT: 30 seconds (ref 24–36)

## 2019-08-30 LAB — LIPASE, BLOOD: Lipase: 34 U/L (ref 11–51)

## 2019-08-30 SURGERY — LEFT HEART CATH AND CORS/GRAFTS ANGIOGRAPHY
Anesthesia: Moderate Sedation

## 2019-08-30 MED ORDER — SERTRALINE HCL 50 MG PO TABS
50.0000 mg | ORAL_TABLET | Freq: Every day | ORAL | Status: DC
  Administered 2019-08-30 – 2019-08-31 (×2): 50 mg via ORAL
  Filled 2019-08-30 (×2): qty 1

## 2019-08-30 MED ORDER — ASPIRIN 81 MG PO CHEW
81.0000 mg | CHEWABLE_TABLET | Freq: Every day | ORAL | Status: DC
  Administered 2019-08-31: 81 mg via ORAL
  Filled 2019-08-30: qty 1

## 2019-08-30 MED ORDER — SODIUM CHLORIDE 0.9 % WEIGHT BASED INFUSION: 3.0000 mL/kg/h | INTRAVENOUS | Status: DC

## 2019-08-30 MED ORDER — HEPARIN BOLUS VIA INFUSION
3700.0000 [IU] | Freq: Once | INTRAVENOUS | Status: AC
  Administered 2019-08-30: 3700 [IU] via INTRAVENOUS
  Filled 2019-08-30: qty 3700

## 2019-08-30 MED ORDER — ATORVASTATIN CALCIUM 20 MG PO TABS
40.0000 mg | ORAL_TABLET | Freq: Every day | ORAL | Status: DC
  Administered 2019-08-30: 40 mg via ORAL
  Filled 2019-08-30: qty 2

## 2019-08-30 MED ORDER — ASPIRIN 81 MG PO CHEW: 81.0000 mg | CHEWABLE_TABLET | ORAL | Status: DC

## 2019-08-30 MED ORDER — HEPARIN (PORCINE) 25000 UT/250ML-% IV SOLN
900.0000 [IU]/h | INTRAVENOUS | Status: DC
  Administered 2019-08-30: 900 [IU]/h via INTRAVENOUS
  Filled 2019-08-30: qty 250

## 2019-08-30 MED ORDER — SODIUM CHLORIDE 0.9 % IV SOLN: INTRAVENOUS | Status: DC

## 2019-08-30 MED ORDER — HEPARIN (PORCINE) IN NACL 1000-0.9 UT/500ML-% IV SOLN
INTRAVENOUS | Status: AC
  Filled 2019-08-30: qty 1000

## 2019-08-30 MED ORDER — ZOLPIDEM TARTRATE 5 MG PO TABS: 5.0000 mg | ORAL_TABLET | Freq: Every evening | ORAL | Status: DC | PRN

## 2019-08-30 MED ORDER — SODIUM CHLORIDE 0.9 % IV SOLN: 250.0000 mL | INTRAVENOUS | Status: DC | PRN

## 2019-08-30 MED ORDER — ALPRAZOLAM 0.25 MG PO TABS: 0.2500 mg | ORAL_TABLET | Freq: Two times a day (BID) | ORAL | Status: DC | PRN

## 2019-08-30 MED ORDER — FENTANYL CITRATE (PF) 100 MCG/2ML IJ SOLN
INTRAMUSCULAR | Status: DC | PRN
  Administered 2019-08-30: 25 ug via INTRAVENOUS

## 2019-08-30 MED ORDER — SODIUM CHLORIDE 0.9% FLUSH
3.0000 mL | Freq: Two times a day (BID) | INTRAVENOUS | Status: DC
Start: 2019-08-31 — End: 2019-08-30

## 2019-08-30 MED ORDER — ASPIRIN 300 MG RE SUPP: 300.0000 mg | RECTAL | Status: AC

## 2019-08-30 MED ORDER — MORPHINE SULFATE (PF) 2 MG/ML IV SOLN
2.0000 mg | INTRAVENOUS | Status: DC | PRN
  Administered 2019-08-30: 2 mg via INTRAVENOUS
  Filled 2019-08-30: qty 1

## 2019-08-30 MED ORDER — METOPROLOL TARTRATE 50 MG PO TABS
100.0000 mg | ORAL_TABLET | Freq: Two times a day (BID) | ORAL | Status: DC
  Administered 2019-08-30 – 2019-08-31 (×3): 100 mg via ORAL
  Filled 2019-08-30 (×3): qty 2

## 2019-08-30 MED ORDER — INSULIN ASPART 100 UNIT/ML ~~LOC~~ SOLN
0.0000 [IU] | Freq: Four times a day (QID) | SUBCUTANEOUS | Status: DC
  Administered 2019-08-30: 3 [IU] via SUBCUTANEOUS
  Administered 2019-08-31: 1 [IU] via SUBCUTANEOUS
  Filled 2019-08-30 (×2): qty 1

## 2019-08-30 MED ORDER — SODIUM CHLORIDE 0.9 % WEIGHT BASED INFUSION: 1.0000 mL/kg/h | INTRAVENOUS | Status: DC

## 2019-08-30 MED ORDER — ACETAMINOPHEN 325 MG PO TABS: 650.0000 mg | ORAL_TABLET | ORAL | Status: DC | PRN

## 2019-08-30 MED ORDER — SODIUM CHLORIDE 0.9% FLUSH: 3.0000 mL | INTRAVENOUS | Status: DC | PRN

## 2019-08-30 MED ORDER — ISOSORBIDE MONONITRATE ER 60 MG PO TB24
60.0000 mg | ORAL_TABLET | Freq: Every day | ORAL | Status: DC
  Administered 2019-08-30 – 2019-08-31 (×2): 60 mg via ORAL
  Filled 2019-08-30 (×2): qty 1

## 2019-08-30 MED ORDER — HEPARIN (PORCINE) IN NACL 1000-0.9 UT/500ML-% IV SOLN
INTRAVENOUS | Status: DC | PRN
  Administered 2019-08-30: 500 mL

## 2019-08-30 MED ORDER — MIDAZOLAM HCL 2 MG/2ML IJ SOLN
INTRAMUSCULAR | Status: DC | PRN
  Administered 2019-08-30: 1 mg via INTRAVENOUS

## 2019-08-30 MED ORDER — LABETALOL HCL 5 MG/ML IV SOLN: 10.0000 mg | INTRAVENOUS | Status: AC | PRN

## 2019-08-30 MED ORDER — ASPIRIN 81 MG PO CHEW
324.0000 mg | CHEWABLE_TABLET | ORAL | Status: AC
  Administered 2019-08-30: 324 mg via ORAL
  Filled 2019-08-30: qty 4

## 2019-08-30 MED ORDER — ONDANSETRON HCL 4 MG/2ML IJ SOLN
4.0000 mg | Freq: Four times a day (QID) | INTRAMUSCULAR | Status: DC | PRN
Start: 2019-08-30 — End: 2019-08-30

## 2019-08-30 MED ORDER — ASPIRIN 81 MG PO CHEW
324.0000 mg | CHEWABLE_TABLET | Freq: Once | ORAL | Status: AC
  Administered 2019-08-30: 324 mg via ORAL
  Filled 2019-08-30: qty 4

## 2019-08-30 MED ORDER — NITROGLYCERIN 0.4 MG SL SUBL: 0.4000 mg | SUBLINGUAL_TABLET | SUBLINGUAL | Status: DC | PRN

## 2019-08-30 MED ORDER — MIDAZOLAM HCL 2 MG/2ML IJ SOLN
INTRAMUSCULAR | Status: AC
  Filled 2019-08-30: qty 2

## 2019-08-30 MED ORDER — HYDRALAZINE HCL 20 MG/ML IJ SOLN: 10.0000 mg | INTRAMUSCULAR | Status: AC | PRN

## 2019-08-30 MED ORDER — ONDANSETRON HCL 4 MG/2ML IJ SOLN
4.0000 mg | Freq: Four times a day (QID) | INTRAMUSCULAR | Status: DC | PRN
  Administered 2019-08-30 (×2): 4 mg via INTRAVENOUS
  Filled 2019-08-30 (×2): qty 2

## 2019-08-30 MED ORDER — IOHEXOL 300 MG/ML  SOLN
INTRAMUSCULAR | Status: DC | PRN
  Administered 2019-08-30: 80 mL

## 2019-08-30 MED ORDER — ACETAMINOPHEN 325 MG PO TABS
650.0000 mg | ORAL_TABLET | ORAL | Status: DC | PRN
  Administered 2019-08-31: 650 mg via ORAL
  Filled 2019-08-30: qty 2

## 2019-08-30 MED ORDER — PANTOPRAZOLE SODIUM 20 MG PO TBEC
20.0000 mg | DELAYED_RELEASE_TABLET | Freq: Two times a day (BID) | ORAL | Status: DC
  Administered 2019-08-30 – 2019-08-31 (×3): 20 mg via ORAL
  Filled 2019-08-30 (×6): qty 1

## 2019-08-30 MED ORDER — SODIUM CHLORIDE 0.9 % IV SOLN: Freq: Once | INTRAVENOUS | Status: AC

## 2019-08-30 MED ORDER — SODIUM CHLORIDE 0.9 % IV SOLN
250.0000 mL | INTRAVENOUS | Status: DC | PRN
Start: 2019-08-31 — End: 2019-08-30

## 2019-08-30 MED ORDER — ASPIRIN EC 81 MG PO TBEC: 81.0000 mg | DELAYED_RELEASE_TABLET | Freq: Every day | ORAL | Status: DC

## 2019-08-30 MED ORDER — HEPARIN SODIUM (PORCINE) 5000 UNIT/ML IJ SOLN
4000.0000 [IU] | Freq: Once | INTRAMUSCULAR | Status: DC
Start: 2019-08-30 — End: 2019-08-30

## 2019-08-30 MED ORDER — SODIUM CHLORIDE 0.9% FLUSH: 3.0000 mL | Freq: Two times a day (BID) | INTRAVENOUS | Status: DC

## 2019-08-30 MED ORDER — FENTANYL CITRATE (PF) 100 MCG/2ML IJ SOLN
INTRAMUSCULAR | Status: AC
  Filled 2019-08-30: qty 2

## 2019-08-30 SURGICAL SUPPLY — 12 items
CATH INFINITI 5 FR 3DRC (CATHETERS) ×1 IMPLANT
CATH INFINITI 5 FR JL3.5 (CATHETERS) ×1 IMPLANT
CATH INFINITI 5FR ANG PIGTAIL (CATHETERS) ×1 IMPLANT
CATH INFINITI 5FR JL4 (CATHETERS) ×1 IMPLANT
CATH INFINITI JR4 5F (CATHETERS) ×1 IMPLANT
DEVICE CLOSURE MYNXGRIP 5F (Vascular Products) ×1 IMPLANT
KIT MANI 3VAL PERCEP (MISCELLANEOUS) ×2 IMPLANT
NDL PERC 18GX7CM (NEEDLE) IMPLANT
NEEDLE PERC 18GX7CM (NEEDLE) ×2 IMPLANT
PACK CARDIAC CATH (CUSTOM PROCEDURE TRAY) ×1 IMPLANT
SHEATH AVANTI 5FR X 11CM (SHEATH) ×1 IMPLANT
WIRE GUIDERIGHT .035X150 (WIRE) ×1 IMPLANT

## 2019-08-30 NOTE — Consult Note (Signed)
CARDIOLOGY CONSULT NOTE               Patient ID: Candace [name] MRN: [id number] DOB/AGE: 68-Oct-1952 68 y.o.  Admit date: 08/30/2019 Referring Physician Dr. Eugenie Norrie  Primary Physician Dr. Rosanna Randy  Primary Cardiologist Dr. Nehemiah Massed  Reason for Consultation Chest pain  HPI: Candace [name] is a 69 year old female with a past medical history significant for coronary artery disease s/p CABG x 4 with a LIMA to the LAD, SVG to PDA, OM1 and OM2 at [facility] in 2017, PCI to [facility] on 4/18, balloon angioplasty to a small vessel in 2019 at [facility], paroxsymal atrial fibrillation, not on anticoagulation, carotid artery atherosclerosis, hyperlipidemia, and hypertension who presented to the ED on 08/29/19 for ongoing chest pain, per recommendations of cardiologist Dr. Nehemiah Massed. Workup in the ED has been significant for high sensitivity troponin negative x3, ECG revealing sinus rhythm with a left bundle branch block, and chest xray negative for acute cardiopulmonary disease.  She continues to experience intermittent, sharp chest pain with associated shortness of breath.  Her symptoms are limiting her from performing normal daily activities.  She was recently started on Ranexa per Dr. Nehemiah Massed, but became nauseous and dizzy and discontinued it.    She is followed in outpatient cardiology by Dr. Nehemiah Massed.  Most recent stress test Myoview on 03/21/19 revealed a small perfusion abnormality of mild intensity in the apical myocardial region on stress images. Echocardiogram on 03/07/19 revealed normal RV and LV systolic function with an EF estimated between 55-60% with mild MR, mildly elevated pulmonary pressures.   Review of systems complete and found to be negative unless listed above     Past Medical History:  Diagnosis Date  . Allergy   . Anemia   . Anxiety   . Arrhythmia   . Arthritis   . Atrial fibrillation ([place])   . Coronary artery disease   . Depression   . Diabetes mellitus without  complication ([place])   . Dyspnea    doe  . Dysrhythmia   . GERD (gastroesophageal reflux disease)   . Headache   . History of hiatal hernia   . Hyperlipidemia   . Hypertension   . Myocardial infarction ([place])    2016, 04/2017  . Myocardial infarction with cardiac rehabilitation [facility])    MI 2016/ CABG 8/17    FINISHED CARDIAC REHAB 3 WEEKS AGO  . Panic attack   . Pneumonia   . Reflux   . Stroke [facility]) 2015   showed up on MRI; no weakness noted  . TIA (transient ischemic attack)   . Voice tremor     Past Surgical History:  Procedure Laterality Date  . ABDOMINAL HYSTERECTOMY    . APPENDECTOMY  1975  . ARTERY BIOPSY Right 04/26/2016   Procedure: BIOPSY TEMPORAL ARTERY;  Surgeon: Margaretha Sheffield, MD;  Location: [facility] ORS;  Service: ENT;  Laterality: Right;  . CARDIAC CATHETERIZATION N/A 11/06/2015   Procedure: Left Heart Cath and Coronary Angiography;  Surgeon: Corey Skains, MD;  Location: [place] CV LAB;  Service: Cardiovascular;  Laterality: N/A;  . CESAREAN SECTION    . COLONOSCOPY  2015  . COLONOSCOPY WITH PROPOFOL N/A 11/03/2018   Procedure: COLONOSCOPY WITH PROPOFOL;  Surgeon: Virgel Manifold, MD;  Location: [facility] ENDOSCOPY;  Service: Endoscopy;  Laterality: N/A;  . CORONARY ANGIOPLASTY  04/2017   [place]  . CORONARY ARTERY BYPASS GRAFT N/A 11/10/2015   Procedure: CORONARY ARTERY BYPASS GRAFTING (CABG),  ON PUMP, TIMES FOUR, USING LEFT INTERNAL MAMMARY ARTERY, BILATERAL GREATER SAPHENOUS VEINS HARVESTED ENDOSCOPICALLY;  Surgeon: [name], MD;  Location: [place];  Service: Open Heart Surgery;  Laterality: N/A;  LIMA-LAD; SEQ SVG-OM1-OM2; SVG-PL  . CORONARY STENT INTERVENTION N/A 08/05/2016   Procedure: Coronary Stent Intervention;  Surgeon: Isaias Cowman, MD;  Location: [place] CV LAB;  Service: Cardiovascular;  Laterality: N/A;  . ESOPHAGOGASTRODUODENOSCOPY (EGD) WITH PROPOFOL N/A 11/03/2018   Procedure: ESOPHAGOGASTRODUODENOSCOPY (EGD) WITH  PROPOFOL;  Surgeon: Virgel Manifold, MD;  Location: [facility] ENDOSCOPY;  Service: Endoscopy;  Laterality: N/A;  . HYSTERECTOMY ABDOMINAL WITH SALPINGO-OOPHORECTOMY Bilateral 08/15/2017   Procedure: HYSTERECTOMY ABDOMINAL WITH BILATERAL SALPINGO-OOPHORECTOMY;  Surgeon: Rubie Maid, MD;  Location: [facility] ORS;  Service: Gynecology;  Laterality: Bilateral;  . LEFT HEART CATH AND CORONARY ANGIOGRAPHY N/A 08/05/2016   Procedure: Left Heart Cath and Coronary Angiography;  Surgeon: Isaias Cowman, MD;  Location: [place] CV LAB;  Service: Cardiovascular;  Laterality: N/A;  . LEFT HEART CATH AND CORS/GRAFTS ANGIOGRAPHY N/A 09/19/2018   Procedure: LEFT HEART CATH AND CORS/GRAFTS ANGIOGRAPHY;  Surgeon: Corey Skains, MD;  Location: [place] CV LAB;  Service: Cardiovascular;  Laterality: N/A;  . OOPHORECTOMY    . TEE WITHOUT CARDIOVERSION N/A 11/10/2015   Procedure: TRANSESOPHAGEAL ECHOCARDIOGRAM (TEE);  Surgeon: [name], MD;  Location: [place];  Service: Open Heart Surgery;  Laterality: N/A;  . TUBAL LIGATION      (Not in a hospital admission)  Social History   Socioeconomic History  . Marital status: Divorced    Spouse name: Not on file  . Number of children: 3  . Years of education: Not on file  . Highest education level: Some college, no degree  Occupational History  . Occupation: retired  Tobacco Use  . Smoking status: Former Smoker    Packs/day: 0.50    Types: Cigarettes    Quit date: 10/07/2001    Years since quitting: 17.9  . Smokeless tobacco: Never Used  Substance and Sexual Activity  . Alcohol use: No    Alcohol/week: 0.0 standard drinks  . Drug use: No  . Sexual activity: Not Currently  Other Topics Concern  . Not on file  Social History Narrative   Lives at home alone   Social Determinants of Health   Financial Resource Strain: Low Risk   . Difficulty of Paying Living Expenses: Not hard at all  Food Insecurity: No Food Insecurity  . Worried About  Charity fundraiser in the Last Year: Never true  . Ran Out of Food in the Last Year: Never true  Transportation Needs: No Transportation Needs  . Lack of Transportation (Medical): No  . Lack of Transportation (Non-Medical): No  Physical Activity: Inactive  . Days of Exercise per Week: 0 days  . Minutes of Exercise per Session: 0 min  Stress: No Stress Concern Present  . Feeling of Stress : Not at all  Social Connections: Somewhat Isolated  . Frequency of Communication with Friends and Family: Three times a week  . Frequency of Social Gatherings with Friends and Family: Twice a week  . Attends Religious Services: More than 4 times per year  . Active Member of Clubs or Organizations: No  . Attends Archivist Meetings: Never  . Marital Status: Divorced  Human resources officer Violence: Not At Risk  . Fear of Current or Ex-Partner: No  . Emotionally Abused: No  . Physically Abused: No  . Sexually Abused: No  Family History  Problem Relation Age of Onset  . Cancer Father   . Hypertension Father   . Heart disease Father   . Asthma Father   . Cancer Mother   . Hypertension Mother   . Pancreatic cancer Mother 31  . Cancer Sister   . Breast cancer Sister 41  . Breast cancer Sister 30  . Lung cancer Brother   . Pancreatic cancer Sister 74  . Cancer Sister       Review of systems complete and found to be negative unless listed above      PHYSICAL EXAM  General: Well developed, well nourished, in no acute distress HEENT:  Normocephalic and atramatic Neck:  No JVD.  Lungs: Clear bilaterally to auscultation and percussion. Heart: HRRR . Normal S1 and S2 without gallops or murmurs.  Abdomen: Bowel sounds are positive, abdomen soft and non-tender  Msk:  Back normal. Normal strength and tone for age. Extremities: No clubbing, cyanosis or edema.   Neuro: Alert and oriented X 3. Psych:  Good affect, responds appropriately  Labs:   Lab Results  Component Value Date    WBC 7.3 08/29/2019   HGB 13.9 08/29/2019   HCT 40.5 08/29/2019   MCV 92.3 08/29/2019   PLT 229 08/29/2019    Recent Labs  Lab 08/29/19 1844 08/29/19 2112  NA 137  --   K 3.8  --   CL 103  --   CO2 27  --   BUN 12  --   CREATININE 0.75  --   CALCIUM 9.3  --   PROT  --  7.0  BILITOT  --  0.6  ALKPHOS  --  98  ALT  --  15  AST  --  21  GLUCOSE 220*  --    Lab Results  Component Value Date   CKTOTAL 91 08/01/2012   CKMB 5.9 (H) 07/14/2013   TROPONINI <0.03 09/14/2018    Lab Results  Component Value Date   CHOL 214 (H) 01/03/2019   CHOL 167 01/12/2018   CHOL 259 (H) 10/01/2016   Lab Results  Component Value Date   HDL 53 01/03/2019   HDL 51 01/12/2018   HDL 66 10/01/2016   Lab Results  Component Value Date   LDLCALC 147 (H) 01/03/2019   LDLCALC 87 01/12/2018   LDLCALC 173 (H) 10/01/2016   Lab Results  Component Value Date   TRIG 77 01/03/2019   TRIG 145 01/12/2018   TRIG 100 10/01/2016   Lab Results  Component Value Date   CHOLHDL 4.0 01/03/2019   CHOLHDL 3.9 10/01/2016   No results found for: LDLDIRECT    Radiology: DG Chest 2 View  Result Date: 08/29/2019 CLINICAL DATA:  Chest tightness EXAM: CHEST - 2 VIEW COMPARISON:  06/10/2019 FINDINGS: Prior CABG. Heart and mediastinal contours are within normal limits. No focal opacities or effusions. No acute bony abnormality. IMPRESSION: No active cardiopulmonary disease. Electronically Signed   By: Rolm Baptise M.D.   On: 08/29/2019 19:24    EKG: Normal sinus rhythm with a left bundle branch block   ASSESSMENT AND PLAN:   1.  Chest pain with history of coronary artery disease s/p CABG, PCI   -Ruled out for ACS but with ongoing chest pain   -With history of extensive CAD, ongoing symptoms, and recent abnormal stress test, will proceed with left heart catheterization with Dr. Bartholome Bill for further evaluation; benefits and risks including bleeding, infection, MI, stroke, death all discussed with  patient who  wishes to proceed   -Continue aspirin, Imdur, metoprolol tartrate 100mg  BID   -Unable to tolerate Ranexa, allergic to ACE inhibitors   2.  Paroxsymal atrial fibrillation   -Currently in sinus rhythm, not on anticoagulation   3.  Hypertension   -Currently hypertensive; will adjust medications following catheterization as indicated   The history, physical exam findings, and plan of care were all discussed with Dr. Bartholome Bill, and all decision making was made in collaboration.   Signed: Avie Arenas PA-C 08/30/2019, 9:39 AM

## 2019-08-30 NOTE — Progress Notes (Signed)
*  PRELIMINARY RESULTS* Echocardiogram 2D Echocardiogram has been performed.  Candace [name] 08/30/2019, 12:16 PM

## 2019-08-30 NOTE — Progress Notes (Addendum)
ANTICOAGULATION CONSULT NOTE - Initial Consult  Pharmacy Consult for heparin Indication: chest pain/ACS  Allergies  Allergen Reactions  . Lisinopril Cough  . Penicillins Swelling, Rash and Other (See Comments)    Did it involve swelling of the face/tongue/throat, SOB, or low BP? Yes Did it involve sudden or severe rash/hives, skin peeling, or any reaction on the inside of your mouth or nose? Yes Did you need to seek medical attention at a hospital or doctor's office? Yes When did it last happen?15 years If all above answers are "NO", may proceed with cephalosporin use.     Patient Measurements: Height: 5\' 2"  (157.5 cm) Weight: 77.1 kg (170 lb) IBW/kg (Calculated) : 50.1 Heparin Dosing Weight: 61 kg  Vital Signs: Temp: 98.1 F (36.7 C) (06/02 2111) Temp Source: Oral (06/02 2111) BP: 165/80 (06/03 0630) Pulse Rate: 67 (06/03 0630)  Labs: Recent Labs    08/29/19 1844 08/29/19 2112  HGB 13.9  --   HCT 40.5  --   PLT 229  --   CREATININE 0.75  --   TROPONINIHS 9 8    Estimated Creatinine Clearance: 64.7 mL/min (by C-G formula based on SCr of 0.75 mg/dL).   Medical History: Past Medical History:  Diagnosis Date  . Allergy   . Anemia   . Anxiety   . Arrhythmia   . Arthritis   . Atrial fibrillation ([place])   . Coronary artery disease   . Depression   . Diabetes mellitus without complication ([place])   . Dyspnea    doe  . Dysrhythmia   . GERD (gastroesophageal reflux disease)   . Headache   . History of hiatal hernia   . Hyperlipidemia   . Hypertension   . Myocardial infarction ([place])    2016, 04/2017  . Myocardial infarction with cardiac rehabilitation [facility])    MI 2016/ CABG 8/17    FINISHED CARDIAC REHAB 3 WEEKS AGO  . Panic attack   . Pneumonia   . Reflux   . Stroke [facility]) 2015   showed up on MRI; no weakness noted  . TIA (transient ischemic attack)   . Voice tremor     Medications:  Scheduled:  . aspirin  324 mg Oral NOW   Or  . aspirin  300  mg Rectal NOW  . aspirin  81 mg Oral Daily  . atorvastatin  40 mg Oral Daily  . isosorbide mononitrate  60 mg Oral Daily  . metoprolol tartrate  100 mg Oral BID  . pantoprazole  20 mg Oral BID  . sertraline  50 mg Oral Daily    Assessment: Patient admitted for CP for a while radiating to upper back, EKG showing wide QRS complexes w/ ST depression at II and T wave abnormalities, but normal troponins. No anticoagulation PTA, baseline CBC WNL, aPTT/INR pending. Patient is being started on heparin drip for management of UA/ACS.  Goal of Therapy:  Heparin level 0.3-0.7 units/ml Monitor platelets by anticoagulation protocol: Yes   Plan:  Will bolus heparin 3700 units IV x 1 Will start rate at 900 units/hr. Will check anti-Xa at 1400 Will monitor daily CBC's and adjust per anti-Xa levels.  Tobie Lords, PharmD, BCPS Clinical Pharmacist 08/30/2019,7:13 AM

## 2019-08-30 NOTE — H&P (Signed)
[facility] [place] NAME: Candace [name]    MR#:  [id number]  DATE OF BIRTH:  03-08-1951  DATE OF ADMISSION:  08/30/2019  PRIMARY CARE PHYSICIAN: [name]., MD   REQUESTING/REFERRING PHYSICIAN: Hinda Kehr, MD CHIEF COMPLAINT:   Chief Complaint  Patient presents with  . Chest Pain    HISTORY OF PRESENT ILLNESS:  Candace [name]  is a 70 y.o. female with a known history of multiple medical problems including coronary artery disease status post CABG as well as PCI with stent and other problems that will be mentioned below, who presented to the emergency room with acute onset of substernal chest pain which has been intermittent lately for 3 weeks, felt as pressure and tightness with nausea and graded 6-8/10 in severity with radiation to the left arm and shoulder.  The patient has been getting in touch with his cardiologist Dr. Nehemiah Massed who recommended cardiac catheterization with increased severity of his pain.  He has advised him to come to the ER.  Upon presentation to the emergency room, blood pressure was 152/90 with otherwise normal vital signs.  Labs revealed blood glucose of 220 with otherwise unremarkable CMP and CBC was within normal.  High-sensitivity troponin I was 9 and later 8.  Two-view chest x-ray showed prior CABG with no active cardiopulmonary disease. EKG showed normal sinus rhythm with a rate of 80 with nonspecific intraventricular conduction block and T wave inversion inferiorly and laterally.  The patient was given 4 baby aspirin, IV heparin bolus and drip and hydration with IV normal saline.  He will be admitted to a progressive cardiac observation bed for further evaluation and management. PAST MEDICAL HISTORY:   Past Medical History:  Diagnosis Date  . Allergy   . Anemia   . Anxiety   . Arrhythmia   . Arthritis   . Atrial fibrillation ([place])   . Coronary artery disease   . Depression   . Diabetes mellitus without  complication ([place])   . Dyspnea    doe  . Dysrhythmia   . GERD (gastroesophageal reflux disease)   . Headache   . History of hiatal hernia   . Hyperlipidemia   . Hypertension   . Myocardial infarction ([place])    2016, 04/2017  . Myocardial infarction with cardiac rehabilitation [facility])    MI 2016/ CABG 8/17    FINISHED CARDIAC REHAB 3 WEEKS AGO  . Panic attack   . Pneumonia   . Reflux   . Stroke [facility]) 2015   showed up on MRI; no weakness noted  . TIA (transient ischemic attack)   . Voice tremor     PAST SURGICAL HISTORY:   Past Surgical History:  Procedure Laterality Date  . ABDOMINAL HYSTERECTOMY    . APPENDECTOMY  1975  . ARTERY BIOPSY Right 04/26/2016   Procedure: BIOPSY TEMPORAL ARTERY;  Surgeon: Margaretha Sheffield, MD;  Location: [facility] ORS;  Service: ENT;  Laterality: Right;  . CARDIAC CATHETERIZATION N/A 11/06/2015   Procedure: Left Heart Cath and Coronary Angiography;  Surgeon: Corey Skains, MD;  Location: [place] CV LAB;  Service: Cardiovascular;  Laterality: N/A;  . CESAREAN SECTION    . COLONOSCOPY  2015  . COLONOSCOPY WITH PROPOFOL N/A 11/03/2018   Procedure: COLONOSCOPY WITH PROPOFOL;  Surgeon: Virgel Manifold, MD;  Location: [facility] ENDOSCOPY;  Service: Endoscopy;  Laterality: N/A;  . CORONARY ANGIOPLASTY  04/2017   [place]  . CORONARY ARTERY BYPASS GRAFT N/A  11/10/2015   Procedure: CORONARY ARTERY BYPASS GRAFTING (CABG), ON PUMP, TIMES FOUR, USING LEFT INTERNAL MAMMARY ARTERY, BILATERAL GREATER SAPHENOUS VEINS HARVESTED ENDOSCOPICALLY;  Surgeon: [name], MD;  Location: [place];  Service: Open Heart Surgery;  Laterality: N/A;  LIMA-LAD; SEQ SVG-OM1-OM2; SVG-PL  . CORONARY STENT INTERVENTION N/A 08/05/2016   Procedure: Coronary Stent Intervention;  Surgeon: Isaias Cowman, MD;  Location: [place] CV LAB;  Service: Cardiovascular;  Laterality: N/A;  . ESOPHAGOGASTRODUODENOSCOPY (EGD) WITH PROPOFOL N/A 11/03/2018   Procedure:  ESOPHAGOGASTRODUODENOSCOPY (EGD) WITH PROPOFOL;  Surgeon: Virgel Manifold, MD;  Location: [facility] ENDOSCOPY;  Service: Endoscopy;  Laterality: N/A;  . HYSTERECTOMY ABDOMINAL WITH SALPINGO-OOPHORECTOMY Bilateral 08/15/2017   Procedure: HYSTERECTOMY ABDOMINAL WITH BILATERAL SALPINGO-OOPHORECTOMY;  Surgeon: Rubie Maid, MD;  Location: [facility] ORS;  Service: Gynecology;  Laterality: Bilateral;  . LEFT HEART CATH AND CORONARY ANGIOGRAPHY N/A 08/05/2016   Procedure: Left Heart Cath and Coronary Angiography;  Surgeon: Isaias Cowman, MD;  Location: [place] CV LAB;  Service: Cardiovascular;  Laterality: N/A;  . LEFT HEART CATH AND CORS/GRAFTS ANGIOGRAPHY N/A 09/19/2018   Procedure: LEFT HEART CATH AND CORS/GRAFTS ANGIOGRAPHY;  Surgeon: Corey Skains, MD;  Location: [place] [facility] LAB;  Service: Cardiovascular;  Laterality: N/A;  . OOPHORECTOMY    . TEE WITHOUT CARDIOVERSION N/A 11/10/2015   Procedure: TRANSESOPHAGEAL ECHOCARDIOGRAM (TEE);  Surgeon: [name], MD;  Location: [place];  Service: Open Heart Surgery;  Laterality: N/A;  . TUBAL LIGATION      SOCIAL HISTORY:   Social History   Tobacco Use  . Smoking status: Former Smoker    Packs/day: 0.50    Types: Cigarettes    Quit date: 10/07/2001    Years since quitting: 17.9  . Smokeless tobacco: Never Used  Substance Use Topics  . Alcohol use: No    Alcohol/week: 0.0 standard drinks    FAMILY HISTORY:   Family History  Problem Relation Age of Onset  . Cancer Father   . Hypertension Father   . Heart disease Father   . Asthma Father   . Cancer Mother   . Hypertension Mother   . Pancreatic cancer Mother 37  . Cancer Sister   . Breast cancer Sister 38  . Breast cancer Sister 25  . Lung cancer Brother   . Pancreatic cancer Sister 37  . Cancer Sister     DRUG ALLERGIES:   Allergies  Allergen Reactions  . Lisinopril Cough  . Penicillins Swelling, Rash and Other (See Comments)    Did it involve swelling of  the face/tongue/throat, SOB, or low BP? Yes Did it involve sudden or severe rash/hives, skin peeling, or any reaction on the inside of your mouth or nose? Yes Did you need to seek medical attention at a hospital or doctor's office? Yes When did it last happen?15 years If all above answers are "NO", may proceed with cephalosporin use.     REVIEW OF SYSTEMS:   ROS As per history of present illness. All pertinent systems were reviewed above. Constitutional,  HEENT, cardiovascular, respiratory, GI, GU, musculoskeletal, neuro, psychiatric, endocrine,  integumentary and hematologic systems were reviewed and are otherwise  negative/unremarkable except for positive findings mentioned above in the HPI.   MEDICATIONS AT HOME:   Prior to Admission medications   Medication Sig Start Date End Date Taking? Authorizing Provider  acetaminophen (TYLENOL) 325 MG tablet Take 650 mg by mouth every 6 (six) hours as needed for moderate pain or headache.   Yes [provider]  [facility] ASPIRIN ADULT LOW DOSE 81 MG chewable tablet CHEW 1 TABLET (81 MG TOTAL) BY MOUTH DAILY. Patient taking differently: Chew 81 mg by mouth daily.  11/04/17  Yes [name]., MD  isosorbide mononitrate (IMDUR) 60 MG 24 hr tablet Take 1 tablet (60 mg total) by mouth daily. 03/08/19  Yes Wieting, Richard, MD  metFORMIN (GLUCOPHAGE) 1000 MG tablet TAKE 1 TABLET (1,000 MG TOTAL) BY MOUTH 2 (TWO) TIMES DAILY WITH A MEAL. 08/07/19  Yes [name]., MD  metoprolol tartrate (LOPRESSOR) 100 MG tablet Take 100 mg by mouth 2 (two) times daily. 10/03/18  Yes [provider]  nitroGLYCERIN (NITROSTAT) 0.4 MG SL tablet Place 1 tablet (0.4 mg total) under the tongue every 5 (five) minutes as needed for chest pain. 07/27/16  Yes Mody, Ulice Bold, MD  pantoprazole (PROTONIX) 20 MG tablet Take 1 tablet (20 mg total) by mouth 2 (two) times daily. 07/18/18  Yes [name]., MD  sertraline (ZOLOFT) 50 MG tablet  TAKE 1 TABLET BY MOUTH EVERY DAY Patient taking differently: Take 50 mg by mouth daily.  07/04/19  Yes [name]., MD  benzonatate (TESSALON) 200 MG capsule Take 1 capsule (200 mg total) by mouth 3 (three) times daily as needed for cough. Patient not taking: Reported on 06/12/2019 03/27/19   [name]., MD  Ipratropium-Albuterol (COMBIVENT RESPIMAT) 20-100 MCG/ACT AERS respimat Inhale 1 puff into the lungs every 6 (six) hours as needed for wheezing or shortness of breath. Patient not taking: Reported on 07/12/2019 03/08/19   Loletha Grayer, MD  Lancets [facility] DELICA PLUS [id number]) MISC USE UP TO 4 TIMES DAILY AS DIRECTED 07/10/19   [name]., MD  meclizine (ANTIVERT) 12.5 MG tablet Take 1 tablet (12.5 mg total) by mouth 3 (three) times daily as needed for dizziness. Patient not taking: Reported on 08/30/2019 03/09/19   Loletha Grayer, MD  nitrofurantoin, macrocrystal-monohydrate, (MACROBID) 100 MG capsule Take 1 capsule (100 mg total) by mouth 2 (two) times daily. Patient not taking: Reported on 07/12/2019 06/19/19   [name]., MD  ondansetron (ZOFRAN-ODT) 8 MG disintegrating tablet DISSOLVE 1 TABLET ON THE TONGUE EVERY 8 HOURS AS NEEDED FOR NAUSEA OR VOMITING Patient not taking: Reported on 08/30/2019 07/10/19   [name]., MD  Semaglutide,0.25 or 0.5MG /DOS, (OZEMPIC, 0.25 OR 0.5 MG/DOSE,) 2 MG/1.5ML SOPN Inject 0.25 mg into the skin once a week. Patient not taking: Reported on 07/12/2019 01/04/19   [name]., MD  cetirizine (ZYRTEC) 5 MG tablet Take 2 tablets (10 mg total) by mouth daily. 12/19/18 03/06/19  [name]., MD      VITAL SIGNS:  Blood pressure (!) 165/80, pulse 67, temperature 98.1 F (36.7 C), temperature source Oral, resp. rate 16, height 5\' 2"  (1.575 m), weight 77.1 kg, SpO2 96 %.  PHYSICAL EXAMINATION:  Physical Exam  GENERAL:  69 y.o.-year-old African-American female patient lying in the bed  with no acute distress.  EYES: Pupils equal, round, reactive to light and accommodation. No scleral icterus. Extraocular muscles intact.  HEENT: Head atraumatic, normocephalic. Oropharynx and nasopharynx clear.  NECK:  Supple, no jugular venous distention. No thyroid enlargement, no tenderness.  LUNGS: Normal breath sounds bilaterally, no wheezing, rales,rhonchi or crepitation. No use of accessory muscles of respiration.  CARDIOVASCULAR: Regular rate and rhythm, S1, S2 normal. No murmurs, rubs, or gallops.  ABDOMEN: Soft, nondistended, nontender. Bowel sounds present. No organomegaly or  mass.  EXTREMITIES: No pedal edema, cyanosis, or clubbing.  NEUROLOGIC: Cranial nerves II through XII are intact. Muscle strength 5/5 in all extremities. Sensation intact. Gait not checked.  PSYCHIATRIC: The patient is alert and oriented x 3.  Normal affect and good eye contact. SKIN: No obvious rash, lesion, or ulcer.   LABORATORY PANEL:   CBC Recent Labs  Lab 08/29/19 1844  WBC 7.3  HGB 13.9  HCT 40.5  PLT 229   ------------------------------------------------------------------------------------------------------------------  Chemistries  Recent Labs  Lab 08/29/19 1844 08/29/19 2112  NA 137  --   K 3.8  --   CL 103  --   CO2 27  --   GLUCOSE 220*  --   BUN 12  --   CREATININE 0.75  --   CALCIUM 9.3  --   AST  --  21  ALT  --  15  ALKPHOS  --  98  BILITOT  --  0.6   ------------------------------------------------------------------------------------------------------------------  Cardiac Enzymes No results for input(s): TROPONINI in the last 168 hours. ------------------------------------------------------------------------------------------------------------------  RADIOLOGY:  DG Chest 2 View  Result Date: 08/29/2019 CLINICAL DATA:  Chest tightness EXAM: CHEST - 2 VIEW COMPARISON:  06/10/2019 FINDINGS: Prior CABG. Heart and mediastinal contours are within normal limits. No focal  opacities or effusions. No acute bony abnormality. IMPRESSION: No active cardiopulmonary disease. Electronically Signed   By: Rolm Baptise M.D.   On: 08/29/2019 19:24      IMPRESSION AND PLAN:   1.  Chest pain concerning for unstable angina/ACS, with history of coronary artery disease status post CABG, status post PCI and stent.. -The patient will be admitted to a progressive cardiac observation bed. -We will follow serial troponin I's. -The patient will be continued on IV heparin. -2D echo and a cardiology consult will be obtained. -I notified Dr. Ubaldo Glassing about patient. -The patient will be placed on aspirin as well as as needed sublingual nitroglycerin and morphine sulfate for pain. -Beta-blocker therapy, Imdur and high-dose statin therapy will be provided.  2.  Type 2 diabetes mellitus. -The patient will be placed on supplement coverage with NovoLog.  3.  Dyslipidemia. -Statin therapy will be resumed.  4.  Depression. -Zoloft will be resumed.  5.  GERD. -PPI therapy will be resumed.  6.  DVT prophylaxis. -The patient will be on IV heparin.    All the records are reviewed and case discussed with ED provider. The plan of care was discussed in details with the patient (and family). I answered all questions. The patient agreed to proceed with the above mentioned plan. Further management will depend upon hospital course.   CODE STATUS: Full code  Status is: Observation  The patient remains OBS appropriate and will d/c before 2 midnights.  Dispo: The patient is from: Home              Anticipated d/c is to: Home              Anticipated d/c date is: 1 day              Patient currently is not medically stable to d/c.   TOTAL TIME TAKING CARE OF THIS PATIENT: 55 minutes.    Christel Mormon M.D on 08/30/2019 at 7:20 AM  [facility]   From 7 PM-7 AM, contact night-coverage [email or web address]  CC: Primary care physician; [name]., MD   Note: This  dictation was prepared with Dragon dictation along with smaller phrase technology. Any transcriptional errors  that result from this process are unintentional.

## 2019-08-30 NOTE — ED Provider Notes (Signed)
[facility] Emergency Department Provider Note  ____________________________________________   First MD Initiated Contact with Patient 08/30/19 [phone]     (approximate)  I have reviewed the triage vital signs and the nursing notes.   HISTORY  Chief Complaint Chest Pain    HPI Candace [name] is a 69 y.o. female with medical history as listed below including known coronary artery disease status post at least 1 stent previously.  Her cardiologist is Dr. Nehemiah Massed.  She presents tonight for evaluation of episodes of sharp stabbing chest pain in the middle and left side of her chest that have been increasing in frequency, becoming more and more severe, sharp, and constant.  Nothing in particular makes the worse including nitroglycerin and her usual medications such as Imdur and baby aspirin.  She has been in communication with Dr. Nehemiah Massed in his office over the last few days and they told her yesterday that she needs a cardiac catheterization which they can arrange as an outpatient or she can go to the emergency department.  She states that she did not want to come to the emergency department but the pain was severe enough last night that she felt like she had no choice.  It is better now without any specific treatment but she still feels it, and it seems to come and go with no particular reason.  She denies shortness of breath, fever/chills, nausea, vomiting, and abdominal pain.         Past Medical History:  Diagnosis Date  . Allergy   . Anemia   . Anxiety   . Arrhythmia   . Arthritis   . Atrial fibrillation ([place])   . Coronary artery disease   . Depression   . Diabetes mellitus without complication ([place])   . Dyspnea    doe  . Dysrhythmia   . GERD (gastroesophageal reflux disease)   . Headache   . History of hiatal hernia   . Hyperlipidemia   . Hypertension   . Myocardial infarction ([place])    2016, 04/2017  . Myocardial infarction with cardiac  rehabilitation [facility])    MI 2016/ CABG 8/17    FINISHED CARDIAC REHAB 3 WEEKS AGO  . Panic attack   . Pneumonia   . Reflux   . Stroke [facility]) 2015   showed up on MRI; no weakness noted  . TIA (transient ischemic attack)   . Voice tremor     Patient Active Problem List   Diagnosis Date Noted  . Vertigo   . Unsteady gait   . Acute bronchitis 03/07/2019  . Esophageal dysphagia   . Stomach irritation   . Gastric polyp   . Esophageal lump   . Hx of colonic polyp   . Polyp of colon   . Diverticulosis of large intestine without diverticulitis   . PSVT (paroxysmal supraventricular tachycardia) ([place]) 10/03/2018  . Abnormal ECG 07/05/2018  . Tachycardia 03/27/2018  . Pain in limb 02/28/2018  . Chronic diastolic CHF (congestive heart failure) ([place]) 11/03/2017  . TIA (transient ischemic attack) 11/03/2017  . COPD suggested by initial evaluation ([place]) 08/23/2017  . Acute and chronic respiratory failure with hypoxia ([place]) 08/23/2017  . Postoperative state 08/15/2017  . Incidental pulmonary nodule 08/01/2017  . Non-ST elevation myocardial infarction (NSTEMI), subendocardial infarction, subsequent episode of care ([place]) 06/01/2017  . B12 deficiency 12/01/2016  . Vitamin D deficiency 11/04/2016  . Depression with anxiety 09/30/2016  . Paroxysmal A-fib ([place]) 09/30/2016  . Resting tremor 09/30/2016  .  Mild obstructive sleep apnea 09/30/2016  . Headache 08/18/2016  . Unstable angina ([place]) 08/03/2016  . Type 2 diabetes mellitus with hyperlipidemia ([place]) 08/03/2016  . Asthma 11/11/2015  . S/P CABG x 4 11/10/2015  . Coronary artery disease 11/07/2015  . Carotid artery narrowing 02/08/2014  . Chest pain 02/08/2014  . Mixed hyperlipidemia 02/08/2014  . Atypical chest pain 02/07/2014  . Essential (primary) hypertension 02/07/2014  . Awareness of heartbeats 02/07/2014  . D (diarrhea) 10/05/2013  . Acid reflux 10/05/2013    Past Surgical History:  Procedure Laterality Date  . ABDOMINAL  HYSTERECTOMY    . APPENDECTOMY  1975  . ARTERY BIOPSY Right 04/26/2016   Procedure: BIOPSY TEMPORAL ARTERY;  Surgeon: Margaretha Sheffield, MD;  Location: [facility] ORS;  Service: ENT;  Laterality: Right;  . CARDIAC CATHETERIZATION N/A 11/06/2015   Procedure: Left Heart Cath and Coronary Angiography;  Surgeon: Corey Skains, MD;  Location: [place] CV LAB;  Service: Cardiovascular;  Laterality: N/A;  . CESAREAN SECTION    . COLONOSCOPY  2015  . COLONOSCOPY WITH PROPOFOL N/A 11/03/2018   Procedure: COLONOSCOPY WITH PROPOFOL;  Surgeon: Virgel Manifold, MD;  Location: [facility] ENDOSCOPY;  Service: Endoscopy;  Laterality: N/A;  . CORONARY ANGIOPLASTY  04/2017   [place]  . CORONARY ARTERY BYPASS GRAFT N/A 11/10/2015   Procedure: CORONARY ARTERY BYPASS GRAFTING (CABG), ON PUMP, TIMES FOUR, USING LEFT INTERNAL MAMMARY ARTERY, BILATERAL GREATER SAPHENOUS VEINS HARVESTED ENDOSCOPICALLY;  Surgeon: [name], MD;  Location: [place];  Service: Open Heart Surgery;  Laterality: N/A;  LIMA-LAD; SEQ SVG-OM1-OM2; SVG-PL  . CORONARY STENT INTERVENTION N/A 08/05/2016   Procedure: Coronary Stent Intervention;  Surgeon: Isaias Cowman, MD;  Location: [place] CV LAB;  Service: Cardiovascular;  Laterality: N/A;  . ESOPHAGOGASTRODUODENOSCOPY (EGD) WITH PROPOFOL N/A 11/03/2018   Procedure: ESOPHAGOGASTRODUODENOSCOPY (EGD) WITH PROPOFOL;  Surgeon: Virgel Manifold, MD;  Location: [facility] ENDOSCOPY;  Service: Endoscopy;  Laterality: N/A;  . HYSTERECTOMY ABDOMINAL WITH SALPINGO-OOPHORECTOMY Bilateral 08/15/2017   Procedure: HYSTERECTOMY ABDOMINAL WITH BILATERAL SALPINGO-OOPHORECTOMY;  Surgeon: Rubie Maid, MD;  Location: [facility] ORS;  Service: Gynecology;  Laterality: Bilateral;  . LEFT HEART CATH AND CORONARY ANGIOGRAPHY N/A 08/05/2016   Procedure: Left Heart Cath and Coronary Angiography;  Surgeon: Isaias Cowman, MD;  Location: [place] CV LAB;  Service: Cardiovascular;  Laterality: N/A;   . LEFT HEART CATH AND CORS/GRAFTS ANGIOGRAPHY N/A 09/19/2018   Procedure: LEFT HEART CATH AND CORS/GRAFTS ANGIOGRAPHY;  Surgeon: Corey Skains, MD;  Location: [place] CV LAB;  Service: Cardiovascular;  Laterality: N/A;  . OOPHORECTOMY    . TEE WITHOUT CARDIOVERSION N/A 11/10/2015   Procedure: TRANSESOPHAGEAL ECHOCARDIOGRAM (TEE);  Surgeon: [name], MD;  Location: [place];  Service: Open Heart Surgery;  Laterality: N/A;  . TUBAL LIGATION      Prior to Admission medications   Medication Sig Start Date End Date Taking? Authorizing Provider  acetaminophen (TYLENOL) 325 MG tablet Take 650 mg by mouth every 6 (six) hours as needed for moderate pain or headache.    [provider]  atorvastatin (LIPITOR) 80 MG tablet Take 80 mg by mouth every evening.  07/06/18   [provider]  benzonatate (TESSALON) 200 MG capsule Take 1 capsule (200 mg total) by mouth 3 (three) times daily as needed for cough. Patient not taking: Reported on 06/12/2019 03/27/19   [name]., MD  [employer] ASPIRIN ADULT LOW DOSE 81 MG chewable tablet CHEW 1 TABLET (81 MG TOTAL) BY  MOUTH DAILY. Patient taking differently: Chew 81 mg by mouth daily.  11/04/17   [name]., MD  famotidine (PEPCID) 40 MG tablet Take 40 mg by mouth daily.  09/14/18   [provider]  furosemide (LASIX) 20 MG tablet Take 20 mg by mouth every other day.     [provider]  Ipratropium-Albuterol (COMBIVENT RESPIMAT) 20-100 MCG/ACT AERS respimat Inhale 1 puff into the lungs every 6 (six) hours as needed for wheezing or shortness of breath. Patient not taking: Reported on 07/12/2019 03/08/19   Loletha Grayer, MD  isosorbide mononitrate (IMDUR) 60 MG 24 hr tablet Take 1 tablet (60 mg total) by mouth daily. 03/08/19   Loletha Grayer, MD  Lancets [facility] DELICA PLUS [id number]) MISC USE UP TO 4 TIMES DAILY AS DIRECTED 07/10/19   [name]., MD  loperamide (IMODIUM) 2 MG capsule  Take 2-4 mg by mouth as needed for diarrhea or loose stools.     [provider]  meclizine (ANTIVERT) 12.5 MG tablet Take 1 tablet (12.5 mg total) by mouth 3 (three) times daily as needed for dizziness. 03/09/19   Loletha Grayer, MD  metFORMIN (GLUCOPHAGE) 1000 MG tablet TAKE 1 TABLET (1,000 MG TOTAL) BY MOUTH 2 (TWO) TIMES DAILY WITH A MEAL. 08/07/19   [name]., MD  metoprolol tartrate (LOPRESSOR) 100 MG tablet Take 100 mg by mouth 2 (two) times daily. 10/03/18   [provider]  nitrofurantoin, macrocrystal-monohydrate, (MACROBID) 100 MG capsule Take 1 capsule (100 mg total) by mouth 2 (two) times daily. Patient not taking: Reported on 07/12/2019 06/19/19   [name]., MD  nitroGLYCERIN (NITROSTAT) 0.4 MG SL tablet Place 1 tablet (0.4 mg total) under the tongue every 5 (five) minutes as needed for chest pain. 07/27/16   Bettey Costa, MD  ondansetron (ZOFRAN-ODT) 8 MG disintegrating tablet DISSOLVE 1 TABLET ON THE TONGUE EVERY 8 HOURS AS NEEDED FOR NAUSEA OR VOMITING 07/10/19   [name]., MD  pantoprazole (PROTONIX) 20 MG tablet Take 1 tablet (20 mg total) by mouth 2 (two) times daily. Patient not taking: Reported on 07/12/2019 07/18/18   [name]., MD  ranolazine (RANEXA) 500 MG 12 hr tablet Take 500 mg by mouth 2 (two) times daily. 08/14/19   [provider]  Semaglutide,0.25 or 0.5MG /DOS, (OZEMPIC, 0.25 OR 0.5 MG/DOSE,) 2 MG/1.5ML SOPN Inject 0.25 mg into the skin once a week. Patient not taking: Reported on 07/12/2019 01/04/19   [name]., MD  sertraline (ZOLOFT) 50 MG tablet TAKE 1 TABLET BY MOUTH EVERY DAY 07/04/19   [name]., MD  traMADol (ULTRAM) 50 MG tablet Take 1 tablet (50 mg total) by mouth every 6 (six) hours as needed. Patient not taking: Reported on 07/12/2019 06/10/19   Veryl Speak, MD  cetirizine (ZYRTEC) 5 MG tablet Take 2 tablets (10 mg total) by mouth daily. 12/19/18 03/06/19  [name]., MD    Allergies Lisinopril and Penicillins  Family History  Problem Relation Age of Onset  . Cancer Father   . Hypertension Father   . Heart disease Father   . Asthma Father   . Cancer Mother   . Hypertension Mother   . Pancreatic cancer Mother 36  . Cancer Sister   . Breast cancer Sister 40  . Breast cancer Sister 57  . Lung cancer Brother   . Pancreatic cancer Sister 34  . Cancer Sister  Social History Social History   Tobacco Use  . Smoking status: Former Smoker    Packs/day: 0.50    Types: Cigarettes    Quit date: 10/07/2001    Years since quitting: 17.9  . Smokeless tobacco: Never Used  Substance Use Topics  . Alcohol use: No    Alcohol/week: 0.0 standard drinks  . Drug use: No    Review of Systems Constitutional: No fever/chills Eyes: No visual changes. ENT: No sore throat. Cardiovascular: +chest pain. Respiratory: Denies shortness of breath. Gastrointestinal: No abdominal pain.  No nausea, no vomiting.  No diarrhea.  No constipation. Genitourinary: Negative for dysuria. Musculoskeletal: Negative for neck pain.  Negative for back pain. Integumentary: Negative for rash. Neurological: Negative for headaches, focal weakness or numbness.   ____________________________________________   PHYSICAL EXAM:  VITAL SIGNS: ED Triage Vitals  Enc Vitals Group     BP 08/29/19 1843 (!) 152/90     Pulse Rate 08/29/19 1843 81     Resp 08/29/19 1843 18     Temp 08/29/19 1843 98.6 F (37 C)     Temp Source 08/29/19 1843 Oral     SpO2 08/29/19 1843 98 %     Weight 08/29/19 1843 77.1 kg (170 lb)     Height 08/29/19 1843 1.575 m (5\' 2" )     Head Circumference --      Peak Flow --      Pain Score 08/29/19 1850 8     Pain Loc --      Pain Edu? --      Excl. in [place]? --     Constitutional: Alert and oriented.  Eyes: Conjunctivae are normal.  Head: Atraumatic. Nose: No congestion/rhinnorhea. Mouth/Throat: Patient is wearing a mask. Neck: No  stridor.  No meningeal signs.   Cardiovascular: Normal rate, regular rhythm. Good peripheral circulation. Grossly normal heart sounds.  The patient occasionally winces from chest pain but it is not reproducible to palpation of her anterior chest. Respiratory: Normal respiratory effort.  No retractions. Gastrointestinal: Soft and nontender. No distention.  Musculoskeletal: No lower extremity tenderness nor edema. No gross deformities of extremities. Neurologic:  Normal speech and language. No gross focal neurologic deficits are appreciated.  Skin:  Skin is warm, dry and intact. Psychiatric: Mood and affect are normal. Speech and behavior are normal.  ____________________________________________   LABS (all labs ordered are listed, but only abnormal results are displayed)  Labs Reviewed  BASIC METABOLIC PANEL - Abnormal; Notable for the following components:      Result Value   Glucose, Bld 220 (*)    All other components within normal limits  GLUCOSE, CAPILLARY - Abnormal; Notable for the following components:   Glucose-Capillary 149 (*)    All other components within normal limits  CBC  HEPATIC FUNCTION PANEL  LIPASE, BLOOD  APTT  PROTIME-INR  HEPARIN LEVEL (UNFRACTIONATED)  HEMOGLOBIN A1C  TROPONIN I (HIGH SENSITIVITY)  TROPONIN I (HIGH SENSITIVITY)  TROPONIN I (HIGH SENSITIVITY)   ____________________________________________  EKG  ED ECG REPORT I, Hinda Kehr, the attending physician, personally viewed and interpreted this ECG.  Date: 08/29/2019 EKG Time: 18: 33 Rate: 80 Rhythm: Nonspecific intraventricular conduction delay QRS Axis: normal Intervals: normal ST/T Wave abnormalities: ST depression and T wave inversion in the inferior and lateral leads, but similar to prior. Narrative Interpretation: Concerning for ischemia but also similar to prior EKGs   ____________________________________________  RADIOLOGY I, Hinda Kehr, personally viewed and evaluated  these images (plain radiographs) as part of my  medical decision making, as well as reviewing the written report by the radiologist.  ED MD interpretation: No acute abnormality  Official radiology report(s): DG Chest 2 View  Result Date: 08/29/2019 CLINICAL DATA:  Chest tightness EXAM: CHEST - 2 VIEW COMPARISON:  06/10/2019 FINDINGS: Prior CABG. Heart and mediastinal contours are within normal limits. No focal opacities or effusions. No acute bony abnormality. IMPRESSION: No active cardiopulmonary disease. Electronically Signed   By: Rolm Baptise M.D.   On: 08/29/2019 19:24    ____________________________________________   PROCEDURES   Procedure(s) performed (including Critical Care):  .Critical Care Performed by: Hinda Kehr, MD Authorized by: Hinda Kehr, MD   Critical care provider statement:    Critical care time (minutes):  30   Critical care time was exclusive of:  Separately billable procedures and treating other patients   Critical care was necessary to treat or prevent imminent or life-threatening deterioration of the following conditions: ACS / unstable angina.   Critical care was time spent personally by me on the following activities:  Development of treatment plan with patient or surrogate, discussions with consultants, evaluation of patient's response to treatment, examination of patient, obtaining history from patient or surrogate, ordering and performing treatments and interventions, ordering and review of laboratory studies, ordering and review of radiographic studies, pulse oximetry, re-evaluation of patient's condition and review of old charts .1-3 Lead EKG Interpretation Performed by: Hinda Kehr, MD Authorized by: Hinda Kehr, MD     Interpretation: normal     ECG rate:  78   ECG rate assessment: normal     Rhythm: sinus rhythm     Ectopy: none     Conduction: normal       ____________________________________________   INITIAL IMPRESSION / MDM /  ASSESSMENT AND PLAN / ED COURSE  As part of my medical decision making, I reviewed the following data within the [place] notes reviewed and incorporated, Labs reviewed , EKG interpreted , Old EKG reviewed, Radiograph reviewed , Discussed with admitting physician  and Notes from prior ED visits   Differential diagnosis includes, but is not limited to, ACS including unstable angina, PE, pneumonia, musculoskeletal pain, stable angina, costochondritis.  The patient is on the cardiac monitor to evaluate for evidence of arrhythmia and/or significant heart rate changes.  Vital signs are reassuring other than hypertension.  Patient has been stable in the waiting room for an extended period of time due to overwhelming hospital and ED patient volume.  Her 2 high-sensitivity troponin values were generally reassuring with no evidence of acute ischemia.  However and she is having ongoing chest pain even though it is not as severe as it was previously.  I reviewed the medical record and saw in the notes where apparently Dr. Nehemiah Massed said that she would need a cardiac catheterization of her chest pain persisted or worsened and she was sent to the emergency department under this understanding.  I do not believe she is currently having an MI and I talked to her about the possibility of discharge and outpatient follow-up in clinic but she is obviously concerned about her symptoms as well as by what Dr. Alveria Apley office told her.  Given that her symptoms indicate episodic chest pain that is getting worse over time and occur at rest, I will treat her for unstable angina with a heparin bolus and infusion as well as a full dose aspirin.  She will be admitted to the hospital service and cardiology consulted.  She understands  and agrees with the plan.  I spoke by phone with Dr. Sidney Ace with the hospitalist service and he will admit.         ____________________________________________  FINAL  CLINICAL IMPRESSION(S) / ED DIAGNOSES  Final diagnoses:  Unstable angina ([place])     MEDICATIONS GIVEN DURING THIS VISIT:  Medications  0.9 %  sodium chloride infusion (has no administration in time range)  aspirin chewable tablet 324 mg (has no administration in time range)  heparin bolus via infusion 3,700 Units (has no administration in time range)  heparin ADULT infusion 100 units/mL (25000 units/278mL sodium chloride 0.45%) (has no administration in time range)     ED Discharge Orders    None      *Please note:  SOCHIL VALIS was evaluated in Emergency Department on 08/30/2019 for the symptoms described in the history of present illness. She was evaluated in the context of the global COVID-19 pandemic, which necessitated consideration that the patient might be at risk for infection with the SARS-CoV-2 virus that causes COVID-19. Institutional protocols and algorithms that pertain to the evaluation of patients at risk for COVID-19 are in a state of rapid change based on information released by regulatory bodies including the CDC and federal and state organizations. These policies and algorithms were followed during the patient's care in the ED.  Some ED evaluations and interventions may be delayed as a result of limited staffing during the pandemic.*  Note:  This document was prepared using Dragon voice recognition software and may include unintentional dictation errors.   Hinda Kehr, MD 08/30/19 [phone]

## 2019-08-31 ENCOUNTER — Encounter: Payer: Self-pay | Admitting: Cardiology

## 2019-08-31 DIAGNOSIS — I25119 Atherosclerotic heart disease of native coronary artery with unspecified angina pectoris: Secondary | ICD-10-CM | POA: Diagnosis not present

## 2019-08-31 DIAGNOSIS — I10 Essential (primary) hypertension: Secondary | ICD-10-CM | POA: Diagnosis not present

## 2019-08-31 DIAGNOSIS — I25110 Atherosclerotic heart disease of native coronary artery with unstable angina pectoris: Secondary | ICD-10-CM | POA: Diagnosis not present

## 2019-08-31 DIAGNOSIS — E118 Type 2 diabetes mellitus with unspecified complications: Secondary | ICD-10-CM | POA: Diagnosis not present

## 2019-08-31 DIAGNOSIS — I200 Unstable angina: Secondary | ICD-10-CM | POA: Diagnosis not present

## 2019-08-31 LAB — CBC
HCT: 37.7 % (ref 36.0–46.0)
Hemoglobin: 12.4 g/dL (ref 12.0–15.0)
MCH: 31.3 pg (ref 26.0–34.0)
MCHC: 32.9 g/dL (ref 30.0–36.0)
MCV: 95.2 fL (ref 80.0–100.0)
Platelets: 190 10*3/uL (ref 150–400)
RBC: 3.96 MIL/uL (ref 3.87–5.11)
RDW: 12.7 % (ref 11.5–15.5)
WBC: 6.8 10*3/uL (ref 4.0–10.5)
nRBC: 0 % (ref 0.0–0.2)

## 2019-08-31 LAB — BASIC METABOLIC PANEL
Anion gap: 9 (ref 5–15)
BUN: 13 mg/dL (ref 8–23)
CO2: 24 mmol/L (ref 22–32)
Calcium: 9.1 mg/dL (ref 8.9–10.3)
Chloride: 107 mmol/L (ref 98–111)
Creatinine, Ser: 0.79 mg/dL (ref 0.44–1.00)
GFR calc Af Amer: >60 mL/min (ref >60)
GFR calc non Af Amer: >60 mL/min (ref >60)
Glucose, Bld: 163 mg/dL — ABNORMAL HIGH (ref 70–99)
Potassium: 4.1 mmol/L (ref 3.5–5.1)
Sodium: 140 mmol/L (ref 135–145)

## 2019-08-31 LAB — LIPID PANEL
Cholesterol: 234 mg/dL — ABNORMAL HIGH (ref 0–200)
HDL: 49 mg/dL (ref >40)
LDL Cholesterol: 163 mg/dL — ABNORMAL HIGH (ref 0–99)
Total CHOL/HDL Ratio: 4.8 RATIO
Triglycerides: 110 mg/dL (ref <150)
VLDL: 22 mg/dL (ref 0–40)

## 2019-08-31 LAB — GLUCOSE, CAPILLARY: Glucose-Capillary: 144 mg/dL — ABNORMAL HIGH (ref 70–99)

## 2019-08-31 MED ORDER — METFORMIN HCL 1000 MG PO TABS: 1000.0000 mg | ORAL_TABLET | Freq: Two times a day (BID) | ORAL | 1 refills | Status: DC

## 2019-08-31 MED ORDER — ATORVASTATIN CALCIUM 40 MG PO TABS: 40.0000 mg | ORAL_TABLET | Freq: Every day | ORAL | 1 refills | Status: DC

## 2019-08-31 NOTE — Progress Notes (Signed)
Discharge instructions explained to pt/ verbalized an understanding / iv and tele removed/ transported off unit via wheelchair.  

## 2019-08-31 NOTE — Progress Notes (Signed)
During bedside report for shift handoff, patient reports having a Most form and desires to not be intubated.  Copy of form at bedside.  NP Sharion Settler made aware and code status to be addressed.

## 2019-08-31 NOTE — Plan of Care (Signed)
  Problem: Cardiovascular: Goal: Ability to achieve and maintain adequate cardiovascular perfusion will improve Outcome: Progressing Goal: Vascular access site(s) Level 0-1 will be maintained Outcome: Progressing No voiced complaints of pain,  Right groin site without bleed/hematoma, PPP.

## 2019-08-31 NOTE — Discharge Summary (Signed)
[facility] NAME: Candace [name]    MR#:  [id number]  DATE OF BIRTH:  02/01/1951  DATE OF ADMISSION:  08/30/2019 ADMITTING PHYSICIAN: Christel Mormon, MD  DATE OF DISCHARGE: 08/31/2019  PRIMARY CARE PHYSICIAN: [name]., MD    ADMISSION DIAGNOSIS:  Unstable angina ([place]) [I20.0] Chest pain [R07.9]  DISCHARGE DIAGNOSIS:  Chest pain with h/o CAD s/p CABG and PCI in the past HTN  SECONDARY DIAGNOSIS:   Past Medical History:  Diagnosis Date  . Allergy   . Anemia   . Anxiety   . Arrhythmia   . Arthritis   . Atrial fibrillation ([place])   . Coronary artery disease   . Depression   . Diabetes mellitus without complication ([place])   . Dyspnea    doe  . Dysrhythmia   . GERD (gastroesophageal reflux disease)   . Headache   . History of hiatal hernia   . Hyperlipidemia   . Hypertension   . Myocardial infarction ([place])    2016, 04/2017  . Myocardial infarction with cardiac rehabilitation [facility])    MI 2016/ CABG 8/17    FINISHED CARDIAC REHAB 3 WEEKS AGO  . Panic attack   . Pneumonia   . Reflux   . Stroke [facility]) 2015   showed up on MRI; no weakness noted  . TIA (transient ischemic attack)   . Voice tremor     HOSPITAL COURSE:  Candace [name] is a 69 year old female with a past medical history significant for coronary artery disease s/p CABG x 4 with a LIMA to the LAD, SVG to PDA, OM1 and OM2 at [facility] in 2017, PCI to [place] on 4/18, balloon angioplasty to a small vessel in 2019 at [facility], paroxsymal atrial fibrillation, not on anticoagulation, carotid artery atherosclerosis, hyperlipidemia, and hypertension who presented to the ED on 08/29/19 for ongoing chest pain, per recommendations of cardiologist Dr. Nehemiah Massed  1.  Chest pain concerning for unstable angina with history of coronary artery disease status post CABG, status post PCI and stent in the past -serial troponin I's x3 negative --cardiac cath showed  Insignificant cad in native vessels with patent lima to lad, patent svg to pda and patent stent in svg to om. -received IV heaprin -Dr. Bethanne Ginger input appreciated. Ok to go home and f/u Dr Raliegh Ip -Beta-blocker therapy, Imdur and high-dose statin therapy -ASA   2.  Type 2 diabetes mellitus. - on supplement coverage with NovoLog. --Cont metformin from 6/5  3.  Dyslipidemia. -Statin therapy will be resumed.   4.  Depression. -Zoloft   5.  GERD. -PPI therapy will be resumed.  6.  DVT prophylaxis. -was on IV heparin.  Overall stable. Pt agreeable to go home D/w dr Ubaldo Glassing  CONSULTS OBTAINED:  Treatment Team:  Teodoro Spray, MD  DRUG ALLERGIES:   Allergies  Allergen Reactions  . Lisinopril Cough  . Penicillins Swelling, Rash and Other (See Comments)    Did it involve swelling of the face/tongue/throat, SOB, or low BP? Yes Did it involve sudden or severe rash/hives, skin peeling, or any reaction on the inside of your mouth or nose? Yes Did you need to seek medical attention at a hospital or doctor's office? Yes When did it last happen?15 years If all above answers are "NO", may proceed with cephalosporin use.     DISCHARGE MEDICATIONS:   Allergies as of 08/31/2019      Reactions  Lisinopril Cough   Penicillins Swelling, Rash, Other (See Comments)   Did it involve swelling of the face/tongue/throat, SOB, or low BP? Yes Did it involve sudden or severe rash/hives, skin peeling, or any reaction on the inside of your mouth or nose? Yes Did you need to seek medical attention at a hospital or doctor's office? Yes When did it last happen?15 years If all above answers are "NO", may proceed with cephalosporin use.      Medication List    STOP taking these medications   benzonatate 200 MG capsule Commonly known as: TESSALON   Combivent Respimat 20-100 MCG/ACT Aers respimat Generic drug: Ipratropium-Albuterol   meclizine 12.5 MG tablet Commonly known as:  ANTIVERT   nitrofurantoin (macrocrystal-monohydrate) 100 MG capsule Commonly known as: Macrobid   ondansetron 8 MG disintegrating tablet Commonly known as: ZOFRAN-ODT   Ozempic (0.25 or 0.5 MG/DOSE) 2 MG/1.5ML Sopn Generic drug: Semaglutide(0.25 or 0.5MG /DOS)     TAKE these medications   acetaminophen 325 MG tablet Commonly known as: TYLENOL Take 650 mg by mouth every 6 (six) hours as needed for moderate pain or headache.   atorvastatin 40 MG tablet Commonly known as: LIPITOR Take 1 tablet (40 mg total) by mouth daily.   [employer] Aspirin Adult Low Dose 81 MG chewable tablet Generic drug: aspirin CHEW 1 TABLET (81 MG TOTAL) BY MOUTH DAILY. What changed: See the new instructions.   isosorbide mononitrate 60 MG 24 hr tablet Commonly known as: IMDUR Take 1 tablet (60 mg total) by mouth daily.   metFORMIN 1000 MG tablet Commonly known as: GLUCOPHAGE Take 1 tablet (1,000 mg total) by mouth 2 (two) times daily with a meal. Start taking on: September 01, 2019   metoprolol tartrate 100 MG tablet Commonly known as: LOPRESSOR Take 100 mg by mouth 2 (two) times daily.   nitroGLYCERIN 0.4 MG SL tablet Commonly known as: NITROSTAT Place 1 tablet (0.4 mg total) under the tongue every 5 (five) minutes as needed for chest pain.   OneTouch Delica Plus [id number] Misc USE UP TO 4 TIMES DAILY AS DIRECTED   pantoprazole 20 MG tablet Commonly known as: Protonix Take 1 tablet (20 mg total) by mouth 2 (two) times daily.   sertraline 50 MG tablet Commonly known as: ZOLOFT TAKE 1 TABLET BY MOUTH EVERY DAY       If you experience worsening of your admission symptoms, develop shortness of breath, life threatening emergency, suicidal or homicidal thoughts you must seek medical attention immediately by calling 911 or calling your MD immediately  if symptoms less severe.  You Must read complete instructions/literature along with all the possible adverse reactions/side effects for all the Medicines  you take and that have been prescribed to you. Take any new Medicines after you have completely understood and accept all the possible adverse reactions/side effects.   Please note  You were cared for by a hospitalist during your hospital stay. If you have any questions about your discharge medications or the care you received while you were in the hospital after you are discharged, you can call the unit and asked to speak with the hospitalist on call if the hospitalist that took care of you is not available. Once you are discharged, your primary care physician will handle any further medical issues. Please note that NO REFILLS for any discharge medications will be authorized once you are discharged, as it is imperative that you return to your primary care physician (or establish a relationship with a primary care  physician if you do not have one) for your aftercare needs so that they can reassess your need for medications and monitor your lab values. Today   SUBJECTIVE  No new complaints Has mild GERD   VITAL SIGNS:  Blood pressure (!) 157/80, pulse 70, temperature 98 F (36.7 C), temperature source Oral, resp. rate 18, height 5\' 2"  (1.575 m), weight 79.7 kg, SpO2 97 %.  I/O:    Intake/Output Summary (Last 24 hours) at 08/31/2019 0852 Last data filed at 08/31/2019 0724 Gross per 24 hour  Intake 1628.81 ml  Output 950 ml  Net 678.81 ml    PHYSICAL EXAMINATION:  GENERAL:  69 y.o.-year-old patient lying in the bed with no acute distress.  EYES: Pupils equal, round, reactive to light and accommodation. No scleral icterus.  HEENT: Head atraumatic, normocephalic. Oropharynx and nasopharynx clear.  NECK:  Supple, no jugular venous distention. No thyroid enlargement, no tenderness.  LUNGS: Normal breath sounds bilaterally, no wheezing, rales,rhonchi or crepitation. No use of accessory muscles of respiration.  CARDIOVASCULAR: S1, S2 normal. No murmurs, rubs, or gallops.  ABDOMEN: Soft,  non-tender, non-distended. Bowel sounds present. No organomegaly or mass.  EXTREMITIES: No pedal edema, cyanosis, or clubbing.  NEUROLOGIC: Cranial nerves II through XII are intact. Muscle strength 5/5 in all extremities. Sensation intact. Gait not checked.  PSYCHIATRIC: The patient is alert and oriented x 3.  SKIN: No obvious rash, lesion, or ulcer.   DATA REVIEW:   CBC  Recent Labs  Lab 08/31/19 0640  WBC 6.8  HGB 12.4  HCT 37.7  PLT 190    Chemistries  Recent Labs  Lab 08/29/19 1844 08/29/19 2112 08/31/19 0640  NA   < >  --  140  K   < >  --  4.1  CL   < >  --  107  CO2   < >  --  24  GLUCOSE   < >  --  163*  BUN   < >  --  13  CREATININE   < >  --  0.79  CALCIUM   < >  --  9.1  AST  --  21  --   ALT  --  15  --   ALKPHOS  --  98  --   BILITOT  --  0.6  --    < > = values in this interval not displayed.    Microbiology Results   Recent Results (from the past 240 hour(s))  SARS Coronavirus 2 by RT PCR (hospital order, performed in [facility] hospital lab) Nasopharyngeal Nasopharyngeal Swab     Status: None   Collection Time: 08/30/19 12:26 PM   Specimen: Nasopharyngeal Swab  Result Value Ref Range Status   SARS Coronavirus 2 NEGATIVE NEGATIVE Final    Comment: (NOTE) SARS-CoV-2 target nucleic acids are NOT DETECTED. The SARS-CoV-2 RNA is generally detectable in upper and lower respiratory specimens during the acute phase of infection. The lowest concentration of SARS-CoV-2 viral copies this assay can detect is 250 copies / mL. A negative result does not preclude SARS-CoV-2 infection and should not be used as the sole basis for treatment or other patient management decisions.  A negative result may occur with improper specimen collection / handling, submission of specimen other than nasopharyngeal swab, presence of viral mutation(s) within the areas targeted by this assay, and inadequate number of viral copies (<250 copies / mL). A negative result must be  combined with clinical observations, patient history, and  epidemiological information. Fact Sheet for Patients:   StrictlyIdeas.no Fact Sheet for Healthcare Providers: BankingDealers.co.za This test is not yet approved or cleared  by the [place] FDA and has been authorized for detection and/or diagnosis of SARS-CoV-2 by FDA under an Emergency Use Authorization (EUA).  This EUA will remain in effect (meaning this test can be used) for the duration of the COVID-19 declaration under Section 564(b)(1) of the Act, 21 U.S.C. section 360bbb-3(b)(1), unless the authorization is terminated or revoked sooner. Performed at [facility], [address] [place], [place] [address]     RADIOLOGY:  DG Chest 2 View  Result Date: 08/29/2019 CLINICAL DATA:  Chest tightness EXAM: CHEST - 2 VIEW COMPARISON:  06/10/2019 FINDINGS: Prior CABG. Heart and mediastinal contours are within normal limits. No focal opacities or effusions. No acute bony abnormality. IMPRESSION: No active cardiopulmonary disease. Electronically Signed   By: Rolm Baptise M.D.   On: 08/29/2019 19:24   CARDIAC CATHETERIZATION  Result Date: 08/30/2019  LM lesion is 30% stenosed.  Ost LM to LM lesion is 30% stenosed.  Ost LM lesion is 40% stenosed.  Mid LAD-1 lesion is 40% stenosed.  Mid LAD-2 lesion is 75% stenosed.  Ost LAD to Prox LAD lesion is 55% stenosed.  Ost Cx to Mid Cx lesion is 80% stenosed.  Ost 1st Mrg to 1st Mrg lesion is 75% stenosed.  Mid RCA lesion is 40% stenosed.  SVG and is small.  Non-stenotic Dist Graft to Insertion lesion was previously treated.  SVG and is small.  LIMA and is small.  Insignificant cad in native vessels with patent lima to lad, patent svg to pda and patent stent in svg to om. Medical management   ECHOCARDIOGRAM COMPLETE  Result Date: 08/30/2019    ECHOCARDIOGRAM REPORT   Patient Name:   LESTER PLATAS Kesling Date of Exam: 08/30/2019  Medical Rec #:  [id number]       Height:       62.0 in Accession #:    [id number]      Weight:       170.0 lb Date of Birth:  [birth year]       BSA:          1.784 m Patient Age:    76 years        BP:           159/79 mmHg Patient Gender: F               HR:           69 bpm. Exam Location:  [facility] Procedure: 2D Echo, Cardiac Doppler and Color Doppler Indications:     Chest pain 786.50  History:         Patient has prior history of Echocardiogram examinations, most                  recent 03/07/2019. Previous Myocardial Infarction, TIA and                  Stroke; Risk Factors:Hypertension.  Sonographer:     Sherrie Sport RDCS (AE) Referring Phys:  [id number] [place] Diagnosing Phys: Bartholome Bill MD  Sonographer Comments: No apical window, no subcostal window and Technically challenging study due to limited acoustic windows. IMPRESSIONS  1. Left ventricular ejection fraction, by estimation, is 55 to 60%. The left ventricle has normal function. The left ventricle has no regional wall motion abnormalities. There is mild left ventricular hypertrophy. Left ventricular diastolic  parameters are consistent with Grade I diastolic dysfunction (impaired relaxation).  2. Right ventricular systolic function is normal. The right ventricular size is normal.  3. Left atrial size was mildly dilated.  4. The mitral valve is myxomatous. Mild mitral valve regurgitation.  5. The aortic valve was not well visualized. Aortic valve regurgitation is trivial. FINDINGS  Left Ventricle: Left ventricular ejection fraction, by estimation, is 55 to 60%. The left ventricle has normal function. The left ventricle has no regional wall motion abnormalities. The left ventricular internal cavity size was normal in size. There is  mild left ventricular hypertrophy. Left ventricular diastolic parameters are consistent with Grade I diastolic dysfunction (impaired relaxation). Right Ventricle: The right ventricular size is normal. No increase in right  ventricular wall thickness. Right ventricular systolic function is normal. Left Atrium: Left atrial size was mildly dilated. Right Atrium: Right atrial size was normal in size. Pericardium: There is no evidence of pericardial effusion. Mitral Valve: The mitral valve is myxomatous. Mild mitral valve regurgitation. Tricuspid Valve: The tricuspid valve is not well visualized. Tricuspid valve regurgitation is mild. Aortic Valve: The aortic valve was not well visualized. Aortic valve regurgitation is trivial. Pulmonic Valve: The pulmonic valve was not well visualized. Pulmonic valve regurgitation is not visualized. Aorta: The aortic root was not well visualized. IAS/Shunts: The interatrial septum was not assessed.  LEFT VENTRICLE PLAX 2D LVIDd:         4.02 cm LVIDs:         2.80 cm LV PW:         1.37 cm LV IVS:        1.09 cm LVOT diam:     2.00 cm LVOT Area:     3.14 cm  LEFT ATRIUM         Index LA diam:    4.00 cm 2.24 cm/m                        PULMONIC VALVE AORTA                 PV Vmax:        0.80 m/s Ao Root diam: 2.50 cm PV Peak grad:   2.6 mmHg                       RVOT Peak grad: 2 mmHg   SHUNTS Systemic Diam: 2.00 cm Bartholome Bill MD Electronically signed by Bartholome Bill MD Signature Date/Time: 08/30/2019/12:29:39 PM    Final      CODE STATUS:     Code Status Orders  (From admission, onward)         Start     Ordered   08/30/19 2046  Limited resuscitation (code)  Continuous    Question Answer Comment  In the event of cardiac or respiratory ARREST: Initiate Code Blue, Call Rapid Response Yes   In the event of cardiac or respiratory ARREST: Perform CPR Yes   In the event of cardiac or respiratory ARREST: Perform Intubation/Mechanical Ventilation No   In the event of cardiac or respiratory ARREST: Use NIPPV/BiPAp only if indicated Yes   In the event of cardiac or respiratory ARREST: Administer ACLS medications if indicated Yes   In the event of cardiac or respiratory ARREST: Perform  Defibrillation or Cardioversion if indicated Yes      08/30/19 2045        Code Status History    Date Active Date Inactive  Code Status Order ID Comments User Context   08/30/2019 0711 08/30/2019 2045 Full Code [id number]  Sidney Ace Arvella Merles, MD ED   03/06/2019 2242 03/09/2019 1633 Full Code [id number]  Mansy, Arvella Merles, MD ED   09/19/2018 1352 09/19/2018 1830 Full Code [id number]  Corey Skains, MD Inpatient   03/27/2018 1632 03/28/2018 1840 Full Code [id number]  Dustin Flock, MD Inpatient   08/15/2017 2104 08/18/2017 1715 Full Code [id number]  Rubie Maid, MD Inpatient   12/23/2016 1722 12/24/2016 1639 Full Code [id number]  Gladstone Lighter, MD Inpatient   08/03/2016 2306 08/06/2016 1247 Full Code [id number]  Lance Coon, MD ED   07/26/2016 1639 07/27/2016 1811 Full Code [id number]  Demetrios Loll, MD Inpatient   11/07/2015 1711 11/10/2015 1552 Full Code [id number]  John Giovanni, PA-C Inpatient   11/05/2015 2155 11/07/2015 1646 Full Code [id number]  Henreitta Leber, MD Inpatient   Advance Care Planning Activity    Advance Directive Documentation     Most Recent Value  Type of Advance Directive  Healthcare Power of Attorney  Pre-existing out of facility DNR order (yellow form or pink MOST form)  --  "MOST" Form in Place?  --       TOTAL TIME TAKING CARE OF THIS PATIENT: *40* minutes.    Fritzi Mandes M.D  [facility]    CC: Primary care physician; [name]., MD

## 2019-08-31 NOTE — Progress Notes (Signed)
Patient Name: Candace [name] Date of Encounter: 08/31/2019  Hospital Problem List     Active Problems:   Chest pain    Patient Profile     68 year old female with a past medical history significant for coronary artery disease s/p CABG x 4 with a LIMA to the LAD, SVG to PDA, OM1 and OM2 at [facility] in 2017, PCI to [place] on 4/18, balloon angioplasty to a small vessel in 2019 at [facility], paroxsymal atrial fibrillation, not on anticoagulation, carotid artery atherosclerosis, hyperlipidemia, and hypertension who presented to the ED on 08/29/19 for ongoing chest pain, per recommendations of cardiologist Dr. Nehemiah Massed. Workup in the ED has been significant for high sensitivity troponin negative x3, ECG revealing sinus rhythm with a left bundle branch block, and chest xray negative for acute cardiopulmonary disease.  Subjective   Doing well post cath.  Cath site without tenderness or hematoma.  Distal pulses intact.  No further chest pain.  Inpatient Medications    . aspirin  81 mg Oral Daily  . atorvastatin  40 mg Oral Daily  . insulin aspart  0-9 Units Subcutaneous QID  . isosorbide mononitrate  60 mg Oral Daily  . metoprolol tartrate  100 mg Oral BID  . pantoprazole  20 mg Oral BID  . sertraline  50 mg Oral Daily  . sodium chloride flush  3 mL Intravenous Q12H  . sodium chloride flush  3 mL Intravenous Q12H    Vital Signs    Vitals:   08/30/19 1934 08/30/19 2356 08/31/19 0442 08/31/19 0731  BP: 119/65 (!) 155/74 (!) 152/85 (!) 157/80  Pulse: 71 67 66 70  Resp: 16 16 15 18   Temp: 98.2 F (36.8 C) 97.6 F (36.4 C) 97.6 F (36.4 C) 98 F (36.7 C)  TempSrc: Oral Oral Oral Oral  SpO2: 97% 99% 99% 97%  Weight:   79.7 kg   Height:        Intake/Output Summary (Last 24 hours) at 08/31/2019 0832 Last data filed at 08/31/2019 0724 Gross per 24 hour  Intake 1628.81 ml  Output 950 ml  Net 678.81 ml   Filed Weights   08/30/19 1432 08/30/19 1715 08/31/19 0442  Weight:  77.1 kg 80.6 kg 79.7 kg    Physical Exam    GEN: Well nourished, well developed, in no acute distress.  HEENT: normal.  Neck: Supple, no JVD, carotid bruits, or masses. Cardiac: RRR, no murmurs, rubs, or gallops. No clubbing, cyanosis, edema.  Radials/DP/PT 2+ and equal bilaterally.  Respiratory:  Respirations regular and unlabored, clear to auscultation bilaterally. GI: Soft, nontender, nondistended, BS + x 4. MS: no deformity or atrophy. Skin: warm and dry, no rash. Neuro:  Strength and sensation are intact. Psych: Normal affect.  Labs    CBC Recent Labs    08/29/19 1844 08/31/19 0640  WBC 7.3 6.8  HGB 13.9 12.4  HCT 40.5 37.7  MCV 92.3 95.2  PLT 229 734   Basic Metabolic Panel Recent Labs    08/29/19 1844 08/31/19 0640  NA 137 140  K 3.8 4.1  CL 103 107  CO2 27 24  GLUCOSE 220* 163*  BUN 12 13  CREATININE 0.75 0.79  CALCIUM 9.3 9.1   Liver Function Tests Recent Labs    08/29/19 2112  AST 21  ALT 15  ALKPHOS 98  BILITOT 0.6  PROT 7.0  ALBUMIN 4.2   Recent Labs    08/29/19 2112  LIPASE 34  Cardiac Enzymes No results for input(s): CKTOTAL, CKMB, CKMBINDEX, TROPONINI in the last 72 hours. BNP No results for input(s): BNP in the last 72 hours. D-Dimer No results for input(s): DDIMER in the last 72 hours. Hemoglobin A1C Recent Labs    08/30/19 0715  HGBA1C 9.8*   Fasting Lipid Panel Recent Labs    08/31/19 0640  CHOL 234*  HDL 49  LDLCALC 163*  TRIG 110  CHOLHDL 4.8   Thyroid Function Tests No results for input(s): TSH, T4TOTAL, T3FREE, THYROIDAB in the last 72 hours.  Invalid input(s): FREET3  Telemetry    Sinus rhythm  ECG    Sinus rhythm no ischemia  Radiology    DG Chest 2 View  Result Date: 08/29/2019 CLINICAL DATA:  Chest tightness EXAM: CHEST - 2 VIEW COMPARISON:  06/10/2019 FINDINGS: Prior CABG. Heart and mediastinal contours are within normal limits. No focal opacities or effusions. No acute bony abnormality.  IMPRESSION: No active cardiopulmonary disease. Electronically Signed   By: Rolm Baptise M.D.   On: 08/29/2019 19:24   CARDIAC CATHETERIZATION  Result Date: 08/30/2019  LM lesion is 30% stenosed.  Ost LM to LM lesion is 30% stenosed.  Ost LM lesion is 40% stenosed.  Mid LAD-1 lesion is 40% stenosed.  Mid LAD-2 lesion is 75% stenosed.  Ost LAD to Prox LAD lesion is 55% stenosed.  Ost Cx to Mid Cx lesion is 80% stenosed.  Ost 1st Mrg to 1st Mrg lesion is 75% stenosed.  Mid RCA lesion is 40% stenosed.  SVG and is small.  Non-stenotic Dist Graft to Insertion lesion was previously treated.  SVG and is small.  LIMA and is small.  Insignificant cad in native vessels with patent lima to lad, patent svg to pda and patent stent in svg to om. Medical management   ECHOCARDIOGRAM COMPLETE  Result Date: 08/30/2019    ECHOCARDIOGRAM REPORT   Patient Name:   Candace [name] Date of Exam: 08/30/2019 Medical Rec #:  [id number]       Height:       62.0 in Accession #:    [id number]      Weight:       170.0 lb Date of Birth:  1950/05/12       BSA:          1.784 m Patient Age:    80 years        BP:           159/79 mmHg Patient Gender: F               HR:           69 bpm. Exam Location:  [facility] Procedure: 2D Echo, Cardiac Doppler and Color Doppler Indications:     Chest pain 786.50  History:         Patient has prior history of Echocardiogram examinations, most                  recent 03/07/2019. Previous Myocardial Infarction, TIA and                  Stroke; Risk Factors:Hypertension.  Sonographer:     Sherrie Sport RDCS (AE) Referring Phys:  [id number] [place] Diagnosing Phys: Bartholome Bill MD  Sonographer Comments: No apical window, no subcostal window and Technically challenging study due to limited acoustic windows. IMPRESSIONS  1. Left ventricular ejection fraction, by estimation, is 55 to 60%. The left ventricle has normal function. The left  ventricle has no regional wall motion abnormalities. There is mild left  ventricular hypertrophy. Left ventricular diastolic parameters are consistent with Grade I diastolic dysfunction (impaired relaxation).  2. Right ventricular systolic function is normal. The right ventricular size is normal.  3. Left atrial size was mildly dilated.  4. The mitral valve is myxomatous. Mild mitral valve regurgitation.  5. The aortic valve was not well visualized. Aortic valve regurgitation is trivial. FINDINGS  Left Ventricle: Left ventricular ejection fraction, by estimation, is 55 to 60%. The left ventricle has normal function. The left ventricle has no regional wall motion abnormalities. The left ventricular internal cavity size was normal in size. There is  mild left ventricular hypertrophy. Left ventricular diastolic parameters are consistent with Grade I diastolic dysfunction (impaired relaxation). Right Ventricle: The right ventricular size is normal. No increase in right ventricular wall thickness. Right ventricular systolic function is normal. Left Atrium: Left atrial size was mildly dilated. Right Atrium: Right atrial size was normal in size. Pericardium: There is no evidence of pericardial effusion. Mitral Valve: The mitral valve is myxomatous. Mild mitral valve regurgitation. Tricuspid Valve: The tricuspid valve is not well visualized. Tricuspid valve regurgitation is mild. Aortic Valve: The aortic valve was not well visualized. Aortic valve regurgitation is trivial. Pulmonic Valve: The pulmonic valve was not well visualized. Pulmonic valve regurgitation is not visualized. Aorta: The aortic root was not well visualized. IAS/Shunts: The interatrial septum was not assessed.  LEFT VENTRICLE PLAX 2D LVIDd:         4.02 cm LVIDs:         2.80 cm LV PW:         1.37 cm LV IVS:        1.09 cm LVOT diam:     2.00 cm LVOT Area:     3.14 cm  LEFT ATRIUM         Index LA diam:    4.00 cm 2.24 cm/m                        PULMONIC VALVE AORTA                 PV Vmax:        0.80 m/s Ao Root diam:  2.50 cm PV Peak grad:   2.6 mmHg                       RVOT Peak grad: 2 mmHg   SHUNTS Systemic Diam: 2.00 cm Bartholome Bill MD Electronically signed by Bartholome Bill MD Signature Date/Time: 08/30/2019/12:29:39 PM    Final     Assessment & Plan    1.  Chest pain with history of coronary artery disease s/p CABG, PCI              -Ruled out for ACS but with ongoing chest pain which prompted proceeding with a left heart cath which revealed noncritical AT disease.  Patent LIMA to the LAD, patent saphenous vein graft OM's with patent stent in the OM graft, patent SVG to PDA.  Medical management recommended with current meds.  Her symptoms do not appear to be secondary   progressive coronary disease.  Would ambulate and discharge today with outpatient follow-up with Dr. Nehemiah Massed next week for further work-up to determine etiology of her symptoms.                      -Continue aspirin, Imdur,  metoprolol tartrate 100mg  BID at previous doses.             -Patient is unable to tolerate Ranexa, allergic to ACE inhibitors   -Patient's creatinine stable post cath. 2.  Paroxsymal atrial fibrillation              -Currently in sinus rhythm, not on anticoagulation.  We will continue with this protocol.  3.  Hypertension              -Mildly elevated blood pressure however during hospitalization may be related to the stress of being in the hospital.  We will readdress this as an outpatient.  Okay to discharge with current regimen.  Signed, Javier Docker Kenneith Stief MD 08/31/2019, 8:32 AM  Pager: [phone]

## 2019-09-03 NOTE — Progress Notes (Signed)
Established patient visit  I,[name],acting as a scribe for [name], MD.,have documented all relevant documentation on the behalf of [name], MD,as directed by  [name], MD while in the presence of [name], MD.   Patient: Candace [name]   DOB: 09-Aug-1950   69 y.o. Female  MRN: [id number] Visit Date: 09/04/2019  Today's healthcare provider: [name], MD   Chief Complaint  Patient presents with  . Follow-up  . Diabetes  . Hypertension   Subjective    HPI  Patient was recently admitted for chest pain and had a cardiac cath which revealed minimal disease.  Medical therapy recommended.  She continues to have some left-sided chest pain that is very nondescript.It  is not exertional.  No belching or relation to meals.  No shortness of breath or diaphoresis.  No pleuritic pain. Diabetes Mellitus Type II, follow-up  Lab Results  Component Value Date   HGBA1C 9.4 (A) 09/04/2019   HGBA1C 9.8 (H) 08/30/2019   HGBA1C 9.2 07/10/2019   Last seen for diabetes 1 months ago.  Management since then includes continuing the same treatment. She reports good compliance with treatment. She is not having side effects. none  Home blood sugar records: fasting range: below 200  Episodes of hypoglycemia? No n/a   Current insulin regiment: n/a Most Recent Eye Exam: 06/28/2019  --------------------------------------------------------------------  Hypertension, follow-up  BP Readings from Last 3 Encounters:  09/04/19 (!) 150/84  08/31/19 (!) 157/80  07/12/19 139/84   Wt Readings from Last 3 Encounters:  09/04/19 176 lb (79.8 kg)  08/31/19 175 lb 12.8 oz (79.7 kg)  07/12/19 176 lb (79.8 kg)     She was last seen for hypertension 1 months ago.  BP at that visit was 139/84. Management since that visit includes; controlled. She reports good compliance with treatment. She is not having side effects. none She is not  exercising. She is adherent to low salt diet.   Outside blood pressures are none.  She does not smoke.  Use of agents associated with hypertension: none.   --------------------------------------------------------------------      Medications: Outpatient Medications Prior to Visit  Medication Sig  . acetaminophen (TYLENOL) 325 MG tablet Take 650 mg by mouth every 6 (six) hours as needed for moderate pain or headache.  Marland Kitchen atorvastatin (LIPITOR) 40 MG tablet Take 1 tablet (40 mg total) by mouth daily.  . [employer] ASPIRIN ADULT LOW DOSE 81 MG chewable tablet CHEW 1 TABLET (81 MG TOTAL) BY MOUTH DAILY. (Patient taking differently: Chew 81 mg by mouth daily. )  . isosorbide mononitrate (IMDUR) 60 MG 24 hr tablet Take 1 tablet (60 mg total) by mouth daily.  . Lancets (ONETOUCH DELICA PLUS [id number]) MISC USE UP TO 4 TIMES DAILY AS DIRECTED  . metFORMIN (GLUCOPHAGE) 1000 MG tablet Take 1 tablet (1,000 mg total) by mouth 2 (two) times daily with a meal.  . metoprolol tartrate (LOPRESSOR) 100 MG tablet Take 100 mg by mouth 2 (two) times daily.  . nitroGLYCERIN (NITROSTAT) 0.4 MG SL tablet Place 1 tablet (0.4 mg total) under the tongue every 5 (five) minutes as needed for chest pain.  . pantoprazole (PROTONIX) 20 MG tablet Take 1 tablet (20 mg total) by mouth 2 (two) times daily.  . sertraline (ZOLOFT) 50 MG tablet TAKE 1 TABLET BY MOUTH EVERY DAY (Patient taking differently: Take 50 mg by mouth daily. )   No facility-administered medications prior to visit.  Review of Systems  Constitutional: Negative.   HENT: Negative.   Eyes: Negative.   Respiratory: Negative.   Cardiovascular: Positive for chest pain.  Gastrointestinal: Negative.   Endocrine: Negative.   Genitourinary: Negative.   Allergic/Immunologic: Negative.   Neurological: Negative.   Hematological: Negative.   Psychiatric/Behavioral: Negative.       Objective    BP (!) 150/84 (BP Location: Right Arm, Patient Position:  Sitting, Cuff Size: Large)   Pulse 84   Temp (!) 96.9 F (36.1 C) (Other (Comment))   Resp 18   Ht 5\' 6"  (1.676 m)   Wt 176 lb (79.8 kg)   SpO2 98%   BMI 28.41 kg/m    Physical Exam Vitals and nursing note reviewed.  Constitutional:      Appearance: Normal appearance. She is normal weight.  HENT:     Right Ear: Tympanic membrane normal.     Left Ear: Tympanic membrane normal.     Nose: Nose normal.     Mouth/Throat:     Mouth: Mucous membranes are moist.     Pharynx: Oropharynx is clear.  Eyes:     Conjunctiva/sclera: Conjunctivae normal.  Cardiovascular:     Rate and Rhythm: Normal rate and regular rhythm.     Pulses: Normal pulses.     Heart sounds: Normal heart sounds.  Pulmonary:     Effort: Pulmonary effort is normal.     Breath sounds: Normal breath sounds.  Abdominal:     General: Bowel sounds are normal.     Palpations: Abdomen is soft.  Musculoskeletal:        General: Normal range of motion.     Cervical back: Normal range of motion and neck supple.  Skin:    General: Skin is warm and dry.  Neurological:     Mental Status: She is alert.  Psychiatric:        Mood and Affect: Mood normal.        Behavior: Behavior normal.        Thought Content: Thought content normal.        Judgment: Judgment normal.       Results for orders placed or performed in visit on 09/04/19  POCT glycosylated hemoglobin (Hb A1C)  Result Value Ref Range   Hemoglobin A1C 9.4 (A) 4.0 - 5.6 %   Est. average glucose Bld gHb Est-mCnc 223     Assessment & Plan     1. Type 2 diabetes mellitus with hyperlipidemia (HCC) A1c is 9.4 today.  Jardiance versus Ozempic. - POCT glycosylated hemoglobin (Hb A1C)  2. Benign essential HTN   3. Gastroesophageal reflux disease, unspecified whether esophagitis present Try Carafate one tablet in the morning and one tablet in the evening.  - sucralfate (CARAFATE) 1 g tablet; Take one tablet in the morning and one tablet in the evening.   Dispense: 120 tablet; Refill: 12 4. Coronary artery disease of bypass graft of native heart with stable angina pectoris [facility]) Cardiac cath showed mild disease.  Medical treatment indicated.  5. Type 2 diabetes mellitus with complication, without long-term current use of insulin (HCC)   6. Chest pain, unspecified type No chest tenderness.  No signs of costochondritis.  Treat as reflux/esophagitis.  7. B12 deficiency    Return in about 1 month (around 10/04/2019).      I, [name], MD, have reviewed all documentation for this visit. The documentation on 09/05/19 for the exam, diagnosis, procedures, and orders are all accurate and  complete.    Arvie Villarruel Cranford Mon, MD  [facility] [phone] (phone) [phone] (fax)  [place]

## 2019-09-04 ENCOUNTER — Ambulatory Visit (INDEPENDENT_AMBULATORY_CARE_PROVIDER_SITE_OTHER): Payer: Medicare Other | Admitting: Family Medicine

## 2019-09-04 ENCOUNTER — Other Ambulatory Visit: Payer: Self-pay

## 2019-09-04 ENCOUNTER — Encounter: Payer: Self-pay | Admitting: Family Medicine

## 2019-09-04 VITALS — BP 150/84 | HR 84 | Temp 96.9°F | Resp 18 | Ht 66.0 in | Wt 176.0 lb

## 2019-09-04 DIAGNOSIS — I25708 Atherosclerosis of coronary artery bypass graft(s), unspecified, with other forms of angina pectoris: Secondary | ICD-10-CM | POA: Diagnosis not present

## 2019-09-04 DIAGNOSIS — E1169 Type 2 diabetes mellitus with other specified complication: Secondary | ICD-10-CM | POA: Diagnosis not present

## 2019-09-04 DIAGNOSIS — I10 Essential (primary) hypertension: Secondary | ICD-10-CM | POA: Diagnosis not present

## 2019-09-04 DIAGNOSIS — E118 Type 2 diabetes mellitus with unspecified complications: Secondary | ICD-10-CM

## 2019-09-04 DIAGNOSIS — R079 Chest pain, unspecified: Secondary | ICD-10-CM

## 2019-09-04 DIAGNOSIS — E785 Hyperlipidemia, unspecified: Secondary | ICD-10-CM | POA: Diagnosis not present

## 2019-09-04 DIAGNOSIS — K219 Gastro-esophageal reflux disease without esophagitis: Secondary | ICD-10-CM | POA: Diagnosis not present

## 2019-09-04 DIAGNOSIS — E538 Deficiency of other specified B group vitamins: Secondary | ICD-10-CM

## 2019-09-04 LAB — POCT GLYCOSYLATED HEMOGLOBIN (HGB A1C)
Est. average glucose Bld gHb Est-mCnc: 223
Hemoglobin A1C: 9.4 % — AB (ref 4.0–5.6)

## 2019-09-04 MED ORDER — SUCRALFATE 1 G PO TABS: ORAL_TABLET | ORAL | 12 refills | Status: DC

## 2019-09-12 DIAGNOSIS — E782 Mixed hyperlipidemia: Secondary | ICD-10-CM | POA: Diagnosis not present

## 2019-09-12 DIAGNOSIS — I10 Essential (primary) hypertension: Secondary | ICD-10-CM | POA: Diagnosis not present

## 2019-09-12 DIAGNOSIS — I25118 Atherosclerotic heart disease of native coronary artery with other forms of angina pectoris: Secondary | ICD-10-CM | POA: Diagnosis not present

## 2019-09-17 ENCOUNTER — Inpatient Hospital Stay: Payer: Medicare Other | Admitting: Family Medicine

## 2019-10-03 ENCOUNTER — Ambulatory Visit: Payer: Self-pay

## 2019-10-03 NOTE — Telephone Encounter (Signed)
Patient called and says she's been having off and on chest tightness since last Friday. She says it occurs while she's resting or sitting down and it lasts a few minutes, then goes away and returns in a few hours. She says last night it happened and the pain was a 8. Today she said she took Tylenol around 1330 and lay down. She says now there is no pain, but tightness at a 6. She denies SOB, nausea, any other symptoms other than her allergy symptoms. She says last Friday she felt it radiating away from the chest, but not today. I advised her to go to the ED, she says she would rather come in the office because she didn't want to go and sit for them to say nothing is wrong. I called the office and spoke to [place], [facility] who agreed with the ED. I asked if she would speak to the patient, she agreed, the call was connected successfully.  Reason for Disposition . [1] Chest pain lasts > 5 minutes AND [2] occurred in past 3 days (72 hours) (Exception: feels exactly the same as previously diagnosed heartburn and has accompanying sour taste in mouth)  Answer Assessment - Initial Assessment Questions 1. LOCATION: "Where does it hurt?"       No pain, just tightness 2. RADIATION: "Does the pain go anywhere else?" (e.g., into neck, jaw, arms, back)     Not today; last night and yesterday I did 3. ONSET: "When did the chest pain begin?" (Minutes, hours or days)      Last Friday eveing 4. PATTERN "Does the pain come and go, or has it been constant since it started?"  "Does it get worse with exertion?"      Come and go, not worse with exertion 5. DURATION: "How long does it last" (e.g., seconds, minutes, hours)     2-3 minutes lasts; may happen again a few hours later 6. SEVERITY: "How bad is the pain?"  (e.g., Scale 1-10; mild, moderate, or severe)    - MILD (1-3): doesn't interfere with normal activities     - MODERATE (4-7): interferes with normal activities or awakens from sleep    - SEVERE (8-10): excruciating  pain, unable to do any normal activities       8 earlier today; now after taking Tylenol at 1330 a 6 7. CARDIAC RISK FACTORS: "Do you have any history of heart problems or risk factors for heart disease?" (e.g., angina, prior heart attack; diabetes, high blood pressure, high cholesterol, smoker, or strong family history of heart disease)     Yes 8. PULMONARY RISK FACTORS: "Do you have any history of lung disease?"  (e.g., blood clots in lung, asthma, emphysema, birth control pills)     No 9. CAUSE: "What do you think is causing the chest pain?"     I don't know 10. OTHER SYMPTOMS: "Do you have any other symptoms?" (e.g., dizziness, nausea, vomiting, sweating, fever, difficulty breathing, cough)      Weakness, allergy symptoms  11. PREGNANCY: "Is there any chance you are pregnant?" "When was your last menstrual period?"       N/A  Protocols used: CHEST PAIN-A-AH

## 2019-10-09 DIAGNOSIS — B351 Tinea unguium: Secondary | ICD-10-CM | POA: Diagnosis not present

## 2019-10-09 DIAGNOSIS — E1140 Type 2 diabetes mellitus with diabetic neuropathy, unspecified: Secondary | ICD-10-CM | POA: Diagnosis not present

## 2019-10-09 NOTE — Progress Notes (Signed)
Trena Platt Livia Tarr,acting as a scribe for [name], MD.,have documented all relevant documentation on the behalf of [name], MD,as directed by  [name], MD while in the presence of [name], MD.  Established patient visit   Patient: Candace [name]   DOB: 10-Jan-1951   69 y.o. Female  MRN: [id number] Visit Date: 10/10/2019  Today's healthcare provider: [name], MD   Chief Complaint  Patient presents with  . Gastroesophageal Reflux   Subjective    HPI  She follows up with ongoing chest pain.  She has had stable cardiac work-up with medical management per cardiology. The pain is not exertional. Patient presents today in office to follow up on GERD. Patient says that she is unsure if the Carafate is helping her with her acid reflux.  She wants to feel completely well again.  She has chronic anxiety. Gastroesophageal reflux disease, unspecified whether esophagitis present From 09/04/2019-Try Carafate one tablet in the morning and one tablet in the evening.   Patient said that 1 week ago she was having some chest tightness, SOB and difficulty swallowing.       Medications: Outpatient Medications Prior to Visit  Medication Sig  . acetaminophen (TYLENOL) 325 MG tablet Take 650 mg by mouth every 6 (six) hours as needed for moderate pain or headache.  Marland Kitchen atorvastatin (LIPITOR) 40 MG tablet Take 1 tablet (40 mg total) by mouth daily.  . isosorbide mononitrate (IMDUR) 60 MG 24 hr tablet Take 1 tablet (60 mg total) by mouth daily.  . Lancets (ONETOUCH DELICA PLUS [id number]) MISC USE UP TO 4 TIMES DAILY AS DIRECTED  . metFORMIN (GLUCOPHAGE) 1000 MG tablet Take 1 tablet (1,000 mg total) by mouth 2 (two) times daily with a meal.  . metoprolol tartrate (LOPRESSOR) 100 MG tablet Take 100 mg by mouth 2 (two) times daily.  . nitroGLYCERIN (NITROSTAT) 0.4 MG SL tablet Place 1 tablet (0.4 mg total) under the tongue every 5 (five) minutes as needed  for chest pain.  . pantoprazole (PROTONIX) 20 MG tablet Take 1 tablet (20 mg total) by mouth 2 (two) times daily.  . sertraline (ZOLOFT) 50 MG tablet TAKE 1 TABLET BY MOUTH EVERY DAY (Patient taking differently: Take 50 mg by mouth daily. )  . sucralfate (CARAFATE) 1 g tablet Take one tablet in the morning and one tablet in the evening.  . [employer] ASPIRIN ADULT LOW DOSE 81 MG chewable tablet CHEW 1 TABLET (81 MG TOTAL) BY MOUTH DAILY. (Patient not taking: Reported on 10/10/2019)   No facility-administered medications prior to visit.    Review of Systems  Constitutional: Negative for appetite change, chills, fatigue and fever.  HENT: Negative.   Respiratory: Negative for chest tightness and shortness of breath.   Cardiovascular: Negative for chest pain and palpitations.  Gastrointestinal: Negative for abdominal pain, nausea and vomiting.  Endocrine: Negative.   Allergic/Immunologic: Negative.   Neurological: Negative for dizziness and weakness.  Hematological: Negative.   Psychiatric/Behavioral: The patient is nervous/anxious.        Objective    BP 117/79 (BP Location: Left Arm, Patient Position: Sitting, Cuff Size: Large)   Pulse 87   Temp 98.2 F (36.8 C) (Temporal)   Ht 5\' 6"  (1.676 m)   Wt 174 lb 6.4 oz (79.1 kg)   SpO2 100%   BMI 28.15 kg/m  BP Readings from Last 3 Encounters:  10/10/19 117/79  09/04/19 (!) 150/84  08/31/19 (!) 157/80  Wt Readings from Last 3 Encounters:  10/10/19 174 lb 6.4 oz (79.1 kg)  09/04/19 176 lb (79.8 kg)  08/31/19 175 lb 12.8 oz (79.7 kg)      Physical Exam Vitals reviewed.  Constitutional:      Appearance: Normal appearance.  HENT:     Head: Normocephalic and atraumatic.     Right Ear: External ear normal.     Left Ear: External ear normal.  Eyes:     General: No scleral icterus.    Conjunctiva/sclera: Conjunctivae normal.  Cardiovascular:     Rate and Rhythm: Normal rate and regular rhythm.     Pulses: Normal pulses.     Heart  sounds: Normal heart sounds.  Pulmonary:     Effort: Pulmonary effort is normal.     Breath sounds: Normal breath sounds.  Musculoskeletal:     Right lower leg: No edema.     Left lower leg: No edema.  Skin:    General: Skin is warm and dry.  Neurological:     General: No focal deficit present.     Mental Status: She is alert and oriented to person, place, and time.  Psychiatric:        Mood and Affect: Mood normal.        Behavior: Behavior normal.        Thought Content: Thought content normal.        Judgment: Judgment normal.   DG is normal sinus rhythm with nonspecific ST-T wave changes is unchanged essentially from previous reading   No results found for any visits on 10/10/19.  Assessment & Plan     1. Gastroesophageal reflux disease, unspecified whether esophagitis present Continue present regimen.  Consider Reglan or GI referral if this persists.  2. Chest tightness She certainly has angina but I do not think that the chest pain she is having is anginal.  Think it is probably reflux.  Also think anxiety is a phase of this. - EKG 12-Lead  3. Atypical chest pain On maximal medical therapy for angina.  exertional pain is unchanged.  4. Type 2 diabetes mellitus with complication, without long-term current use of insulin ([address]) 1 see in 2 months.  5. Coronary artery disease of bypass graft of native heart with stable angina pectoris ([place]) All risk factors treated.   Return in about 2 months (around 12/11/2019).      I, [name], MD, have reviewed all documentation for this visit. The documentation on 10/13/19 for the exam, diagnosis, procedures, and orders are all accurate and complete.    Richard Cranford Mon, MD  [facility] [phone] (phone) [phone] (fax)  [place]

## 2019-10-10 ENCOUNTER — Encounter: Payer: Self-pay | Admitting: Family Medicine

## 2019-10-10 ENCOUNTER — Ambulatory Visit: Payer: Medicaid Other | Admitting: Family Medicine

## 2019-10-10 ENCOUNTER — Other Ambulatory Visit: Payer: Self-pay

## 2019-10-10 VITALS — BP 117/79 | HR 87 | Temp 98.2°F | Ht 66.0 in | Wt 174.4 lb

## 2019-10-10 DIAGNOSIS — R0789 Other chest pain: Secondary | ICD-10-CM | POA: Diagnosis not present

## 2019-10-10 DIAGNOSIS — E118 Type 2 diabetes mellitus with unspecified complications: Secondary | ICD-10-CM | POA: Diagnosis not present

## 2019-10-10 DIAGNOSIS — K219 Gastro-esophageal reflux disease without esophagitis: Secondary | ICD-10-CM | POA: Diagnosis not present

## 2019-10-10 DIAGNOSIS — I25708 Atherosclerosis of coronary artery bypass graft(s), unspecified, with other forms of angina pectoris: Secondary | ICD-10-CM

## 2019-10-11 ENCOUNTER — Ambulatory Visit: Payer: Medicaid Other | Admitting: Family Medicine

## 2019-10-19 ENCOUNTER — Telehealth: Payer: Self-pay | Admitting: Family Medicine

## 2019-10-19 NOTE — Telephone Encounter (Signed)
Trudi Ida NP [phone]- calling [facility] with Home based care is calling to let Dr. Rosanna Randy know of the following:. The patient called Ms. Rowe stating that she is not feeling good all week. No BP issues,  Patient reports shaky, loose stool, epi gastric pain, no appetite, has not been around any one sick, no change in meds, or diet.  Talk of a GI work up. Patient report GI issues since Monday. Is it possible the patient can be referred to GI? Medication prescribed Simethicone has not help. Ms. Justine Null advised the patient if she get worse to go the ED or UC Please advise with the patient with GI referral or advice for GI issues. CB Patient [phone]

## 2019-10-20 NOTE — Telephone Encounter (Signed)
Refer to GI 

## 2019-10-22 ENCOUNTER — Other Ambulatory Visit: Payer: Self-pay | Admitting: *Deleted

## 2019-10-22 DIAGNOSIS — R630 Anorexia: Secondary | ICD-10-CM

## 2019-10-22 DIAGNOSIS — R109 Unspecified abdominal pain: Secondary | ICD-10-CM

## 2019-10-22 DIAGNOSIS — R197 Diarrhea, unspecified: Secondary | ICD-10-CM

## 2019-10-22 NOTE — Telephone Encounter (Signed)
Referral ordered

## 2019-11-20 ENCOUNTER — Other Ambulatory Visit: Payer: Self-pay

## 2019-11-21 ENCOUNTER — Encounter: Payer: Self-pay | Admitting: Gastroenterology

## 2019-11-21 ENCOUNTER — Ambulatory Visit (INDEPENDENT_AMBULATORY_CARE_PROVIDER_SITE_OTHER): Payer: Medicare Other | Admitting: Gastroenterology

## 2019-11-21 ENCOUNTER — Other Ambulatory Visit: Payer: Self-pay

## 2019-11-21 VITALS — BP 148/84 | HR 73 | Temp 98.7°F | Ht 62.0 in | Wt 169.2 lb

## 2019-11-21 DIAGNOSIS — R109 Unspecified abdominal pain: Secondary | ICD-10-CM

## 2019-11-21 DIAGNOSIS — R748 Abnormal levels of other serum enzymes: Secondary | ICD-10-CM

## 2019-11-21 NOTE — Progress Notes (Signed)
Candace Antigua, MD [address]  [place]  [place], [place] [address]  Main: [phone]  Fax: [phone]   Primary Care Physician: [name]., MD   Chief Complaint  Patient presents with  . Abdominal Pain    HPI: Candace [name] is a 69 y.o. female previously seen for nausea vomiting, abdominal pain.  Patient states her symptoms have completely resolved.  No abdominal pain, no nausea or vomiting.  No dysphagia.  Reports 1 formed bowel movement daily.  No blood in stool.  No constipation or diarrhea.  Previous history: She underwent EGD and colonoscopy in August 2020 due to her symptoms  Impression:           - Mucosal nodule found in the esophagus. Biopsied.                       - Gastric mucosal atrophy.                       - Two gastric polyps. Biopsied.                       - Normal examined duodenum.                       - Biopsies were obtained in the gastric body, at the                        incisura and in the gastric antrum.  Impression:           - Two 3 to 4 mm polyps in the transverse colon, removed                        with a jumbo cold forceps. Resected and retrieved.                       - Diverticulosis in the sigmoid colon.                       - The examination was otherwise normal.                       - The rectum, sigmoid colon, descending colon,                        transverse colon, ascending colon and cecum are normal.                       - Anal papilla(e) were hypertrophied.                       - The distal rectum and anal verge are normal on                        retroflexion view.                       - Prep was fair to poor as described above. Large                        vegetable matter in the sigmoid colon that could not be  suctioned. Repeat colonoscopy recommended in 1 year with 2-day prep  DIAGNOSIS:  A. STOMACH; COLD BIOPSY:  - REACTIVE GASTROPATHY.  - NEGATIVE FOR  ACTIVE INFLAMMATION AND H. PYLORI.  - NEGATIVE FOR INTESTINAL METAPLASIA, DYSPLASIA, AND MALIGNANCY.   B. STOMACH POLYPS X2; COLD BIOPSY:  - FUNDIC GLAND POLYP, 2 FRAGMENTS.  - NEGATIVE FOR DYSPLASIA AND MALIGNANCY.   C. ESOPHAGUS; COLD BIOPSY:  - UNREMARKABLE SQUAMOUS MUCOSA.  - NEGATIVE FOR INCREASED EOSINOPHILS (<1 PER HIGH-POWER FIELD).  - NEGATIVE FOR INTESTINAL METAPLASIA, DYSPLASIA, AND MALIGNANCY.   D. GEJ NODULE, GASTRIC SIDE; COLDBIOPSY:  - SQUAMOCOLUMNAR JUNCTION WITH INTRAMUCOSAL LYMPHOID AGGREGATE.  - NEGATIVE FOR INTESTINAL METAPLASIA, DYSPLASIA, AND MALIGNANCY.   E. COLON POLYPS X2, TRANSVERSE; COLD BIOPSY:  - TUBULAR ADENOMA, 2 FRAGMENTS.  - NEGATIVE FOR HIGH-GRADE DYSPLASIA AND MALIGNANCY  Patient has had previous endoscopies with [facility]  Last colonoscopy was in 2015 and 2 tubular adenoma colon polyps removed.  Colon biopsies did not show microscopic colitis.  2011 upper endoscopy for dysphagia with mild Schatzki's ring noted and dilated to 16 mm with savory dilator.  Duodenitis also reported  Subsequent upper endoscopies in 2013 and 2015 did not report any esophageal lesions.  Gastric erythema was reported and biopsies were negative for H. Pylori.  Current Outpatient Medications  Medication Sig Dispense Refill  . acetaminophen (TYLENOL) 325 MG tablet Take 650 mg by mouth every 6 (six) hours as needed for moderate pain or headache.     Marland Kitchen atorvastatin (LIPITOR) 40 MG tablet Take 1 tablet (40 mg total) by mouth daily. 30 tablet 1  . isosorbide mononitrate (IMDUR) 60 MG 24 hr tablet Take 1 tablet (60 mg total) by mouth daily. 30 tablet 0  . Lancets (ONETOUCH DELICA PLUS [id number]) MISC USE UP TO 4 TIMES DAILY AS DIRECTED 100 each 0  . metFORMIN (GLUCOPHAGE) 1000 MG tablet Take 1 tablet (1,000 mg total) by mouth 2 (two) times daily with a meal. 180 tablet 1  . metoprolol tartrate (LOPRESSOR) 100 MG tablet Take 100 mg by mouth 2 (two) times daily.      . nitroGLYCERIN (NITROSTAT) 0.4 MG SL tablet Place 1 tablet (0.4 mg total) under the tongue every 5 (five) minutes as needed for chest pain. (Patient taking differently: Place 0.4 mg under the tongue every 5 (five) minutes as needed for chest pain. ) 30 tablet 0  . pantoprazole (PROTONIX) 20 MG tablet Take 1 tablet (20 mg total) by mouth 2 (two) times daily. 60 tablet 11  . sertraline (ZOLOFT) 50 MG tablet TAKE 1 TABLET BY MOUTH EVERY DAY (Patient taking differently: Take 50 mg by mouth daily. ) 90 tablet 1  . sucralfate (CARAFATE) 1 g tablet Take one tablet in the morning and one tablet in the evening. 120 tablet 12   No current facility-administered medications for this visit.    Allergies as of 11/21/2019 - Review Complete 11/21/2019  Allergen Reaction Noted  . Lisinopril Cough 02/07/2017  . Penicillins Swelling, Rash, and Other (See Comments) 11/05/2015    ROS:  General: Negative for anorexia, weight loss, fever, chills, fatigue, weakness. ENT: Negative for hoarseness, difficulty swallowing , nasal congestion. CV: Negative for chest pain, angina, palpitations, dyspnea on exertion, peripheral edema.  Respiratory: Negative for dyspnea at rest, dyspnea on exertion, cough, sputum, wheezing.  GI: See history of present illness. GU:  Negative for dysuria, hematuria, urinary incontinence, urinary frequency, nocturnal urination.  Endo: Negative for unusual weight change.    Physical Examination:  BP (!) 148/84   Pulse 73   Temp 98.7 F (37.1 C) (Oral)   Ht 5\' 2"  (1.575 m)   Wt 169 lb 3.2 oz (76.7 kg)   BMI 30.95 kg/m   General: Well-nourished, well-developed in no acute distress.  Eyes: No icterus. Conjunctivae pink. Mouth: Oropharyngeal mucosa moist and pink , no lesions erythema or exudate. Neck: Supple, Trachea midline Abdomen: Bowel sounds are normal, nontender, nondistended, no hepatosplenomegaly or masses, no abdominal bruits or hernia , no rebound or guarding.    Extremities: No lower extremity edema. No clubbing or deformities. Neuro: Alert and oriented x 3.  Grossly intact. Skin: Warm and dry, no jaundice.   Psych: Alert and cooperative, normal mood and affect.   Labs: CMP     Component Value Date/Time   NA 140 08/31/2019 0640   NA 143 01/12/2019 0955   NA 137 04/29/2014 2042   K 4.1 08/31/2019 0640   K 3.5 04/29/2014 2042   CL 107 08/31/2019 0640   CL 103 04/29/2014 2042   CO2 24 08/31/2019 0640   CO2 24 04/29/2014 2042   GLUCOSE 163 (H) 08/31/2019 0640   GLUCOSE 249 (H) 04/29/2014 2042   BUN 13 08/31/2019 0640   BUN 10 01/12/2019 0955   BUN 10 04/29/2014 2042   CREATININE 0.79 08/31/2019 0640   CREATININE 1.39 (H) 03/24/2017 1638   CALCIUM 9.1 08/31/2019 0640   CALCIUM 9.8 04/29/2014 2042   PROT 7.0 08/29/2019 2112   PROT 6.7 01/12/2019 0955   PROT 7.6 04/29/2014 2042   ALBUMIN 4.2 08/29/2019 2112   ALBUMIN 4.5 01/12/2019 0955   ALBUMIN 4.0 04/29/2014 2042   AST 21 08/29/2019 2112   AST 18 04/29/2014 2042   ALT 15 08/29/2019 2112   ALT 22 04/29/2014 2042   ALKPHOS 98 08/29/2019 2112   ALKPHOS 112 04/29/2014 2042   BILITOT 0.6 08/29/2019 2112   BILITOT 0.6 01/12/2019 0955   BILITOT 0.2 04/29/2014 2042   GFRNONAA >60 08/31/2019 0640   GFRNONAA >60 04/29/2014 2042   GFRNONAA >60 09/16/2013 0523   GFRAA >60 08/31/2019 0640   GFRAA >60 04/29/2014 2042   GFRAA >60 09/16/2013 0523   Lab Results  Component Value Date   WBC 6.8 08/31/2019   HGB 12.4 08/31/2019   HCT 37.7 08/31/2019   MCV 95.2 08/31/2019   PLT 190 08/31/2019    Imaging Studies: No results found.  Assessment and Plan:   Candace [name] is a 69 y.o. y/o female here for follow-up of abdominal pain  Patient is completely asymptomatic at this time with no further abdominal pain, nausea or vomiting.  Reporting formed bowel movements daily.  We discussed repeat colonoscopy given fair prep on last procedure.  Patient understands this but does not want  to repeat colonoscopy at this time and states she will reconsider in about 6 months.  Follow-up with [place] at that time  She was noted to have mildly elevated alkaline phosphatase .  Repeat and obtain GGT as well    Dr Candace [name]

## 2019-11-22 LAB — GAMMA GT: GGT: 15 IU/L (ref 0–60)

## 2019-11-22 LAB — ALKALINE PHOSPHATASE: Alkaline Phosphatase: 139 IU/L — ABNORMAL HIGH (ref 48–121)

## 2019-11-26 ENCOUNTER — Telehealth: Payer: Self-pay

## 2019-11-26 NOTE — Telephone Encounter (Signed)
-----  Message from Virgel Manifold, MD sent at 11/23/2019  1:10 PM EDT ----- Candace [name] please let the patient know, alk phos is elevated, but her GGT is normal.  This means the alkaline phosphatase level is not elevated because of any liver abnormalities.  She should follow-up with her primary care doctor for further evaluation of the elevated alk phos level and they can evaluate for other etiologies such as bone.

## 2019-11-26 NOTE — Telephone Encounter (Signed)
Called patient to give her lab results but had to leave her a detailed message. I told her to call me back if she had any questions.

## 2019-12-05 ENCOUNTER — Ambulatory Visit: Payer: Self-pay | Admitting: *Deleted

## 2019-12-05 ENCOUNTER — Telehealth: Payer: Self-pay | Admitting: Family Medicine

## 2019-12-05 MED ORDER — EMPAGLIFLOZIN 10 MG PO TABS: 10.0000 mg | ORAL_TABLET | Freq: Every day | ORAL | 3 refills | Status: DC

## 2019-12-05 NOTE — Telephone Encounter (Signed)
Consider Jardiance starting at 5mg  daily

## 2019-12-05 NOTE — Progress Notes (Signed)
Trena Platt Cummings,acting as a scribe for [name], MD.,have documented all relevant documentation on the behalf of [name], MD,as directed by  [name], MD while in the presence of [name], MD.  Established patient visit   Patient: Candace [name]   DOB: 11-07-1950   69 y.o. Female  MRN: [id number] Visit Date: 12/06/2019  Today's healthcare provider: [name], MD   Chief Complaint  Patient presents with  . Diabetes   Subjective    HPI   - PATIENT WAS SWITCHED FROM METFORMIN 1,000 MG TO JARDIANCE 10 MG YESTERDAY (12/05/19).   - PATIENT SAYS THAT HER SUGAR HAS BEEN RUNNING HIGH AT HOME.   Diabetes Mellitus Type II, follow-up  Lab Results  Component Value Date   HGBA1C 9.4 (A) 09/04/2019   HGBA1C 9.8 (H) 08/30/2019   HGBA1C 9.2 07/10/2019   Last seen for diabetes 3 months ago.  Management since then includes continuing the same treatment. She reports excellent compliance with treatment. She is not having side effects.   Home blood sugar records: did not check this morning.   Episodes of hypoglycemia? No    Current insulin regiment: NONE Most Recent Eye Exam: 06/28/2019  ---------------------------------------------------------------------------------------------------   Social History   Tobacco Use  . Smoking status: Former Smoker    Packs/day: 0.50    Types: Cigarettes    Quit date: 10/07/2001    Years since quitting: 18.1  . Smokeless tobacco: Never Used  Vaping Use  . Vaping Use: Never used  Substance Use Topics  . Alcohol use: No    Alcohol/week: 0.0 standard drinks  . Drug use: No       Medications: Outpatient Medications Prior to Visit  Medication Sig  . acetaminophen (TYLENOL) 325 MG tablet Take 650 mg by mouth every 6 (six) hours as needed for moderate pain or headache.   Marland Kitchen atorvastatin (LIPITOR) 40 MG tablet Take 1 tablet (40 mg total) by mouth daily.  . empagliflozin (JARDIANCE) 10 MG TABS  tablet Take 1 tablet (10 mg total) by mouth daily.  . isosorbide mononitrate (IMDUR) 60 MG 24 hr tablet Take 1 tablet (60 mg total) by mouth daily.  . Lancets (ONETOUCH DELICA PLUS [id number]) MISC USE UP TO 4 TIMES DAILY AS DIRECTED  . metoprolol tartrate (LOPRESSOR) 100 MG tablet Take 100 mg by mouth 2 (two) times daily.  . nitroGLYCERIN (NITROSTAT) 0.4 MG SL tablet Place 1 tablet (0.4 mg total) under the tongue every 5 (five) minutes as needed for chest pain. (Patient taking differently: Place 0.4 mg under the tongue every 5 (five) minutes as needed for chest pain. )  . pantoprazole (PROTONIX) 20 MG tablet Take 1 tablet (20 mg total) by mouth 2 (two) times daily.  . sertraline (ZOLOFT) 50 MG tablet TAKE 1 TABLET BY MOUTH EVERY DAY (Patient taking differently: Take 50 mg by mouth daily. )  . sucralfate (CARAFATE) 1 g tablet Take one tablet in the morning and one tablet in the evening.  . [DISCONTINUED] metFORMIN (GLUCOPHAGE) 1000 MG tablet Take 1 tablet (1,000 mg total) by mouth 2 (two) times daily with a meal.   No facility-administered medications prior to visit.    Review of Systems  Constitutional: Negative for appetite change, chills, fatigue and fever.  Respiratory: Negative for chest tightness and shortness of breath.   Cardiovascular: Negative for chest pain and palpitations.  Gastrointestinal: Negative for abdominal pain, nausea and vomiting.  Neurological: Negative for dizziness and  weakness.    Last hemoglobin A1c Lab Results  Component Value Date   HGBA1C 12.4 (A) 12/06/2019      Objective    BP 104/67 (BP Location: Right Arm, Patient Position: Sitting, Cuff Size: Normal)   Pulse 89   Temp 98.7 F (37.1 C) (Oral)   Wt 168 lb (76.2 kg)   BMI 30.73 kg/m  BP Readings from Last 3 Encounters:  12/06/19 104/67  11/21/19 (!) 148/84  10/10/19 117/79   Wt Readings from Last 3 Encounters:  12/06/19 168 lb (76.2 kg)  11/21/19 169 lb 3.2 oz (76.7 kg)  10/10/19 174 lb 6.4  oz (79.1 kg)      Physical Exam Vitals reviewed.  Constitutional:      Appearance: Normal appearance.  HENT:     Head: Normocephalic and atraumatic.     Right Ear: External ear normal.     Left Ear: External ear normal.  Eyes:     General: No scleral icterus.    Conjunctiva/sclera: Conjunctivae normal.  Cardiovascular:     Rate and Rhythm: Normal rate and regular rhythm.     Pulses: Normal pulses.     Heart sounds: Normal heart sounds.  Pulmonary:     Effort: Pulmonary effort is normal.     Breath sounds: Normal breath sounds.  Musculoskeletal:     Right lower leg: No edema.     Left lower leg: No edema.  Skin:    General: Skin is warm and dry.  Neurological:     General: No focal deficit present.     Mental Status: She is alert and oriented to person, place, and time.  Psychiatric:        Mood and Affect: Mood normal.        Behavior: Behavior normal.        Thought Content: Thought content normal.        Judgment: Judgment normal.       No results found for any visits on 12/06/19.  Assessment & Plan     1. Type 2 diabetes mellitus with complication, without long-term current use of insulin ([facility]) Very poor control with A1c of 12.4.  Start Jardiance of 10 mg daily and then increase to 25 mg daily - POCT HgB A1C - empagliflozin (JARDIANCE) 25 MG TABS tablet; Take 1 tablet (25 mg total) by mouth daily before breakfast.  Dispense: 30 tablet; Refill: 1  2. Paroxysmal A-fib ([place])   3. Coronary artery disease of bypass graft of native heart with stable angina pectoris ([place]) All risk factors treated  4. Gastroesophageal reflux disease, unspecified whether esophagitis present   5. Depression with anxiety Chronic ongoing problem.  Stable presently   No follow-ups on file.      I, [name], MD, have reviewed all documentation for this visit. The documentation on 12/15/19 for the exam, diagnosis, procedures, and orders are all accurate and  complete.    Kc Sedlak Cranford Mon, MD  [facility] [phone] (phone) [phone] (fax)  [place]

## 2019-12-05 NOTE — Telephone Encounter (Signed)
Patient advised and has an appointment in the morning.

## 2019-12-05 NOTE — Telephone Encounter (Signed)
Pt states she feels nauseous and has diarrhea when taking Metformin and had stopped taking it and wants to know if Dr. Rosanna Randy can recommend something else /please advise

## 2019-12-05 NOTE — Telephone Encounter (Addendum)
Patient with cbg 368 this morning. She stopped taking Metformin 2 weeks ago due to the stomach upset and diarrhea everyday while on Metformin. Blood sugars have been in the 300's this week. Now with vaginal itching. Patient would like to discuss alternatives to Metformin. Denies any vomiting/rapid breathing.Patient will consume a lot of water hourly today and avoid high sugar/carbohydrate foods/drinks and take Metformin as prescribed. Appointment made for tomorrow with PCP.  Reason for Disposition . [1] Blood glucose > 300 mg/dL (16.7 mmol/L) AND [2] does not  use insulin (e.g., not insulin-dependent; most people with type 2 diabetes)  Answer Assessment - Initial Assessment Questions 1. BLOOD GLUCOSE: "What is your blood glucose level?"      368 2. ONSET: "When did you check the blood glucose?"     8:00am 3. USUAL RANGE: "What is your glucose level usually?" (e.g., usual fasting morning value, usual evening value)     300's for a week now that she has not been taking metformin4. KETONES: "Do you check for ketones (urine or blood test strips)?" If yes, ask: "What does the test show now?"     no 5. TYPE 1 or 2:  "Do you know what type of diabetes you have?"  (e.g., Type 1, Type 2, Gestational; doesn't know)      2 6. INSULIN: "Do you take insulin?" "What type of insulin(s) do you use? What is the mode of delivery? (syringe, pen; injection or pump)?"      no 7. DIABETES PILLS: "Do you take any pills for your diabetes?" If yes, ask: "Have you missed taking any pills recently?"     She stopped taking Metformin 1000 mg twice daily due to the side effects. 8. OTHER SYMPTOMS: "Do you have any symptoms?" (e.g., fever, frequent urination, difficulty breathing, dizziness, weakness, vomiting)     Vaginal itching 9. PREGNANCY: "Is there any chance you are pregnant?" "When was your last menstrual period?"     na  Protocols used: DIABETES - HIGH BLOOD SUGAR-A-AH

## 2019-12-06 ENCOUNTER — Other Ambulatory Visit: Payer: Self-pay

## 2019-12-06 ENCOUNTER — Encounter: Payer: Self-pay | Admitting: Family Medicine

## 2019-12-06 ENCOUNTER — Ambulatory Visit (INDEPENDENT_AMBULATORY_CARE_PROVIDER_SITE_OTHER): Payer: Medicare Other | Admitting: Family Medicine

## 2019-12-06 VITALS — BP 104/67 | HR 89 | Temp 98.7°F | Wt 168.0 lb

## 2019-12-06 DIAGNOSIS — F418 Other specified anxiety disorders: Secondary | ICD-10-CM

## 2019-12-06 DIAGNOSIS — I480 Paroxysmal atrial fibrillation: Secondary | ICD-10-CM

## 2019-12-06 DIAGNOSIS — K219 Gastro-esophageal reflux disease without esophagitis: Secondary | ICD-10-CM

## 2019-12-06 DIAGNOSIS — E118 Type 2 diabetes mellitus with unspecified complications: Secondary | ICD-10-CM

## 2019-12-06 DIAGNOSIS — I25708 Atherosclerosis of coronary artery bypass graft(s), unspecified, with other forms of angina pectoris: Secondary | ICD-10-CM | POA: Diagnosis not present

## 2019-12-06 LAB — POCT GLYCOSYLATED HEMOGLOBIN (HGB A1C)
Estimated Average Glucose: 309
Hemoglobin A1C: 12.4 % — AB (ref 4.0–5.6)

## 2019-12-06 MED ORDER — EMPAGLIFLOZIN 25 MG PO TABS: 25.0000 mg | ORAL_TABLET | Freq: Every day | ORAL | 1 refills | Status: DC

## 2019-12-06 NOTE — Patient Instructions (Signed)
CHECK FASTING BLOOD SUGARS EVERY MORNING!!!

## 2019-12-11 ENCOUNTER — Ambulatory Visit: Payer: Medicaid Other | Admitting: Family Medicine

## 2019-12-30 ENCOUNTER — Other Ambulatory Visit: Payer: Self-pay | Admitting: Family Medicine

## 2019-12-30 DIAGNOSIS — F4320 Adjustment disorder, unspecified: Secondary | ICD-10-CM

## 2019-12-30 NOTE — Telephone Encounter (Signed)
Requested Prescriptions  Pending Prescriptions Disp Refills  . sertraline (ZOLOFT) 50 MG tablet [Pharmacy Med Name: SERTRALINE HCL 50 MG TABLET] 90 tablet 1    Sig: TAKE 1 TABLET BY MOUTH EVERY DAY     Psychiatry:  Antidepressants - SSRI Passed - 12/30/2019  8:56 AM      Passed - Completed PHQ-2 or PHQ-9 in the last 360 days.      Passed - Valid encounter within last 6 months    Recent Outpatient Visits          3 weeks ago Type 2 diabetes mellitus with complication, without long-term current use of insulin [facility])   [facility] [name]., MD   2 months ago Gastroesophageal reflux disease, unspecified whether esophagitis present   [facility] [name]., MD   3 months ago Type 2 diabetes mellitus with hyperlipidemia [facility])   [facility] [name]., MD   5 months ago Type 2 diabetes mellitus with hyperlipidemia [facility])   [facility] [name]., MD   6 months ago Type 2 diabetes mellitus with hyperlipidemia [facility])   [facility] [name]., MD      Future Appointments            In 1 month [name]., MD [facility]

## 2020-01-07 ENCOUNTER — Telehealth: Payer: Self-pay

## 2020-01-07 NOTE — Telephone Encounter (Signed)
Patient called in and stated that she is experencing itching inside of vagina. The patient said that last night she put a ice pack on it and that she thinks she has rubbed it raw. The pt is requesting an appointment, Dr. Marcelline Mates doesn't have anything open this week. I am send a message for the patient asking if she can be added to her schedule. The patient stated she has been using vaginal cream and that helps but it doesn't stop the itching. I told the patient I will send a message and that we will be in touch, to please allow 24 - 48 hours for a reply. The patient verbally understood. Please advise

## 2020-01-08 DIAGNOSIS — B351 Tinea unguium: Secondary | ICD-10-CM | POA: Diagnosis not present

## 2020-01-08 DIAGNOSIS — E1140 Type 2 diabetes mellitus with diabetic neuropathy, unspecified: Secondary | ICD-10-CM | POA: Diagnosis not present

## 2020-01-10 NOTE — Telephone Encounter (Signed)
Please add her to the schedule for next week. Thanks [employer]

## 2020-01-10 NOTE — Telephone Encounter (Signed)
On Dr. Amalia Hailey schedule is okay ?

## 2020-01-11 NOTE — Telephone Encounter (Signed)
No ma'am not his schedule she has only seen Dr.Cherry.

## 2020-01-11 NOTE — Telephone Encounter (Signed)
Yes I forgot see if she is about to have a visit with her pcp.

## 2020-01-11 NOTE — Telephone Encounter (Signed)
Candace [name] is out of the office next week, the following week will be okay ?

## 2020-01-15 ENCOUNTER — Telehealth: Payer: Self-pay

## 2020-01-15 NOTE — Telephone Encounter (Signed)
Please advise 

## 2020-01-15 NOTE — Telephone Encounter (Signed)
Pt called in and stated that she called last week and hasnt been called back the pt is having itching inside of vagina. I called back to [place] and ask her. FH said to ask the pt when she is seeing her pcp. The pt said 11/10 but she cant wait that she. FH wanted to see if someone can see her. Can I added her on Thursday? Please advise

## 2020-01-15 NOTE — Telephone Encounter (Signed)
Copied from [place] [id number]. Topic: General - Other >> Jan 15, 2020 10:16 AM Leward Quan A wrote: Reason for CRM: Patient called to inquire of Dr Rosanna Randy about the empagliflozin (JARDIANCE) 10 MG TABS tablet  needing to know if she should be taking the 10 MG or is she to be taking the empagliflozin (JARDIANCE) 25 MG TABS tablet that was discussed in the office. Also want to inform Dr Rosanna Randy that she is having a hard time getting blood out her fingers when she pricks it. States that the blood does not flow since she stopped taking the Asprin that she was once on. Please call patient with some directions Ph# [phone]

## 2020-01-17 NOTE — Telephone Encounter (Signed)
Called pt to see when she was able to come or was she okay. The pt requested to come in for an appointment. The pt requested morning added her on for Friday at 8:45

## 2020-01-17 NOTE — Telephone Encounter (Signed)
May add patient to schedule if she still needs to be seen. Thanks, Dani Gobble, CNM

## 2020-01-18 ENCOUNTER — Encounter: Payer: Self-pay | Admitting: Certified Nurse Midwife

## 2020-01-18 ENCOUNTER — Other Ambulatory Visit: Payer: Self-pay

## 2020-01-18 ENCOUNTER — Ambulatory Visit (INDEPENDENT_AMBULATORY_CARE_PROVIDER_SITE_OTHER): Payer: Medicare Other | Admitting: Certified Nurse Midwife

## 2020-01-18 VITALS — BP 138/79 | HR 94 | Ht 62.0 in | Wt 166.7 lb

## 2020-01-18 DIAGNOSIS — R300 Dysuria: Secondary | ICD-10-CM | POA: Diagnosis not present

## 2020-01-18 DIAGNOSIS — Z23 Encounter for immunization: Secondary | ICD-10-CM

## 2020-01-18 DIAGNOSIS — N9089 Other specified noninflammatory disorders of vulva and perineum: Secondary | ICD-10-CM

## 2020-01-18 DIAGNOSIS — Z8639 Personal history of other endocrine, nutritional and metabolic disease: Secondary | ICD-10-CM | POA: Diagnosis not present

## 2020-01-18 DIAGNOSIS — N898 Other specified noninflammatory disorders of vagina: Secondary | ICD-10-CM | POA: Diagnosis not present

## 2020-01-18 LAB — POCT URINALYSIS DIPSTICK
Bilirubin, UA: NEGATIVE
Glucose, UA: POSITIVE — AB
Leukocytes, UA: NEGATIVE
Nitrite, UA: NEGATIVE
Protein, UA: NEGATIVE
Spec Grav, UA: 1.015 (ref 1.010–1.025)
Urobilinogen, UA: 0.2 E.U./dL
pH, UA: 6.5 (ref 5.0–8.0)

## 2020-01-18 MED ORDER — CLOBETASOL PROPIONATE 0.05 % EX OINT: 1.0000 "application " | TOPICAL_OINTMENT | Freq: Two times a day (BID) | CUTANEOUS | 2 refills | Status: DC

## 2020-01-18 NOTE — Progress Notes (Signed)
GYN ENCOUNTER NOTE  Subjective:       Candace [name] is a 69 y.o. G33P3200 female is here for gynecologic evaluation of the following issues:  1. Intermittent vulvar and vaginal irritation for the last three (3) months, applying Vaseline without symptom relief; feels raw, burns with urination  Denies difficulty breathing or respiratory distress, abdominal pain, and vaginal bleeding.    Gynecologic History  No LMP recorded. Patient has had a hysterectomy.  Contraception: status post hysterectomy  Last Pap: 02/2017. Results were: normal  Last mammogram: 12/2017. Results were: BI-RADS 1  Obstetric History  OB History  Gravida Para Term Preterm AB Living  5 5 3 2       SAB TAB Ectopic Multiple Live Births          2    # Outcome Date GA Lbr Len/2nd Weight Sex Delivery Anes PTL Lv  5 Term 27    M CS-Unspec     4 Term 37    F CS-Unspec     3 Preterm 39    M    ND  2 Preterm 89    M    ND  1 Term 1973    F Vag-Breech       Past Medical History:  Diagnosis Date  . Allergy   . Anemia   . Anxiety   . Arrhythmia   . Arthritis   . Atrial fibrillation ([place])   . Coronary artery disease   . Depression   . Diabetes mellitus without complication ([place])   . Dyspnea    doe  . Dysrhythmia   . GERD (gastroesophageal reflux disease)   . Headache   . History of hiatal hernia   . Hyperlipidemia   . Hypertension   . Myocardial infarction ([place])    2016, 04/2017  . Myocardial infarction with cardiac rehabilitation [facility])    MI 2016/ CABG 8/17    FINISHED CARDIAC REHAB 3 WEEKS AGO  . Panic attack   . Pneumonia   . Reflux   . Stroke [facility]) 2015   showed up on MRI; no weakness noted  . TIA (transient ischemic attack)   . Voice tremor     Past Surgical History:  Procedure Laterality Date  . ABDOMINAL HYSTERECTOMY    . APPENDECTOMY  1975  . ARTERY BIOPSY Right 04/26/2016   Procedure: BIOPSY TEMPORAL ARTERY;  Surgeon: Margaretha Sheffield, MD;  Location: [facility] OR[facility];  Service: ENT;   Laterality: Right;  . CARDIAC CATHETERIZATION N/A 11/06/2015   Procedure: Left Heart Cath and Coronary Angiography;  Surgeon: Corey Skains, MD;  Location: [place] [facility];  Service: Cardiovascular;  Laterality: N/A;  . CESAREAN SECTION    . COLONOSCOPY  2015  . COLONOSCOPY WITH PROPOFOL N/A 11/03/2018   Procedure: COLONOSCOPY WITH PROPOFOL;  Surgeon: Virgel Manifold, MD;  Location: [facility];  Service: Endoscopy;  Laterality: N/A;  . CORONARY ANGIOPLASTY  04/2017   [place]  . CORONARY ARTERY BYPASS GRAFT N/A 11/10/2015   Procedure: CORONARY ARTERY BYPASS GRAFTING (CABG), ON PUMP, TIMES FOUR, USING LEFT INTERNAL MAMMARY ARTERY, BILATERAL GREATER SAPHENOUS VEINS HARVESTED ENDOSCOPICALLY;  Surgeon: [name], MD;  Location: [place];  Service: Open Heart Surgery;  Laterality: N/A;  LIMA-LAD; SEQ SVG-OM1-OM2; SVG-PL  . CORONARY STENT INTERVENTION N/A 08/05/2016   Procedure: Coronary Stent Intervention;  Surgeon: Isaias Cowman, MD;  Location: [place] [facility];  Service: Cardiovascular;  Laterality: N/A;  . ESOPHAGOGASTRODUODENOSCOPY (EGD) WITH PROPOFOL N/A 11/03/2018  Procedure: ESOPHAGOGASTRODUODENOSCOPY (EGD) WITH PROPOFOL;  Surgeon: Virgel Manifold, MD;  Location: [facility];  Service: Endoscopy;  Laterality: N/A;  . HYSTERECTOMY ABDOMINAL WITH SALPINGO-OOPHORECTOMY Bilateral 08/15/2017   Procedure: HYSTERECTOMY ABDOMINAL WITH BILATERAL SALPINGO-OOPHORECTOMY;  Surgeon: Rubie Maid, MD;  Location: [facility];  Service: Gynecology;  Laterality: Bilateral;  . LEFT HEART CATH AND CORONARY ANGIOGRAPHY N/A 08/05/2016   Procedure: Left Heart Cath and Coronary Angiography;  Surgeon: Isaias Cowman, MD;  Location: [place] [facility];  Service: Cardiovascular;  Laterality: N/A;  . LEFT HEART CATH AND CORS/GRAFTS ANGIOGRAPHY N/A 09/19/2018   Procedure: LEFT HEART CATH AND CORS/GRAFTS ANGIOGRAPHY;  Surgeon: Corey Skains, MD;  Location: [place] [facility];  Service: Cardiovascular;  Laterality: N/A;  . LEFT HEART CATH AND CORS/GRAFTS ANGIOGRAPHY N/A 08/30/2019   Procedure: LEFT HEART CATH AND CORS/GRAFTS ANGIOGRAPHY;  Surgeon: Teodoro Spray, MD;  Location: [place] [facility];  Service: Cardiovascular;  Laterality: N/A;  . OOPHORECTOMY    . TEE WITHOUT CARDIOVERSION N/A 11/10/2015   Procedure: TRANSESOPHAGEAL ECHOCARDIOGRAM (TEE);  Surgeon: [name], MD;  Location: [place];  Service: Open Heart Surgery;  Laterality: N/A;  . TUBAL LIGATION      Current Outpatient Medications on File Prior to Visit  Medication Sig Dispense Refill  . acetaminophen (TYLENOL) 325 MG tablet Take 650 mg by mouth every 6 (six) hours as needed for moderate pain or headache.     Marland Kitchen atorvastatin (LIPITOR) 40 MG tablet Take 1 tablet (40 mg total) by mouth daily. 30 tablet 1  . empagliflozin (JARDIANCE) 10 MG TABS tablet Take 1 tablet (10 mg total) by mouth daily. 30 tablet 3  . empagliflozin (JARDIANCE) 25 MG TABS tablet Take 1 tablet (25 mg total) by mouth daily before breakfast. 30 tablet 1  . isosorbide mononitrate (IMDUR) 60 MG 24 hr tablet Take 1 tablet (60 mg total) by mouth daily. 30 tablet 0  . Lancets (ONETOUCH DELICA PLUS [id number]) MISC USE UP TO 4 TIMES DAILY AS DIRECTED 100 each 0  . metoprolol tartrate (LOPRESSOR) 100 MG tablet Take 100 mg by mouth 2 (two) times daily.    . nitroGLYCERIN (NITROSTAT) 0.4 MG SL tablet Place 1 tablet (0.4 mg total) under the tongue every 5 (five) minutes as needed for chest pain. (Patient taking differently: Place 0.4 mg under the tongue every 5 (five) minutes as needed for chest pain. ) 30 tablet 0  . sertraline (ZOLOFT) 50 MG tablet TAKE 1 TABLET BY MOUTH EVERY DAY 90 tablet 1  . pantoprazole (PROTONIX) 20 MG tablet Take 1 tablet (20 mg total) by mouth 2 (two) times daily. (Patient not taking: Reported on 01/18/2020) 60 tablet 11  . sucralfate (CARAFATE) 1 g tablet Take one tablet in the morning and one  tablet in the evening. (Patient not taking: Reported on 01/18/2020) 120 tablet 12  . [DISCONTINUED] cetirizine (ZYRTEC) 5 MG tablet Take 2 tablets (10 mg total) by mouth daily. 30 tablet 5   No current facility-administered medications on file prior to visit.    Allergies  Allergen Reactions  . Lisinopril Cough  . Penicillins Swelling, Rash and Other (See Comments)    Did it involve swelling of the face/tongue/throat, SOB, or low BP? Yes Did it involve sudden or severe rash/hives, skin peeling, or any reaction on the inside of your mouth or nose? Yes Did you need to seek medical attention at a hospital or doctor's office? Yes When did it last happen?15 years If all above  answers are "NO", may proceed with cephalosporin use.     Social History   Socioeconomic History  . Marital status: Divorced    Spouse name: Not on file  . Number of children: 3  . Years of education: Not on file  . Highest education level: Some college, no degree  Occupational History  . Occupation: retired  Tobacco Use  . Smoking status: Former Smoker    Packs/day: 0.50    Types: Cigarettes    Quit date: 10/07/2001    Years since quitting: 18.2  . Smokeless tobacco: Never Used  Vaping Use  . Vaping Use: Never used  Substance and Sexual Activity  . Alcohol use: No    Alcohol/week: 0.0 standard drinks  . Drug use: No  . Sexual activity: Not Currently  Other Topics Concern  . Not on file  Social History Narrative   Lives at home alone   Social Determinants of Health   Financial Resource Strain: Low Risk   . Difficulty of Paying Living Expenses: Not hard at all  Food Insecurity: No Food Insecurity  . Worried About Charity fundraiser in the Last Year: Never true  . Ran Out of Food in the Last Year: Never true  Transportation Needs: No Transportation Needs  . Lack of Transportation (Medical): No  . Lack of Transportation (Non-Medical): No  Physical Activity: Inactive  . Days of Exercise per  Week: 0 days  . Minutes of Exercise per Session: 0 min  Stress: No Stress Concern Present  . Feeling of Stress : Not at all  Social Connections: Moderately Isolated  . Frequency of Communication with Friends and Family: Three times a week  . Frequency of Social Gatherings with Friends and Family: Twice a week  . Attends Religious Services: More than 4 times per year  . Active Member of Clubs or Organizations: No  . Attends Archivist Meetings: Never  . Marital Status: Divorced  Human resources officer Violence: Not At Risk  . Fear of Current or Ex-Partner: No  . Emotionally Abused: No  . Physically Abused: No  . Sexually Abused: No    Family History  Problem Relation Age of Onset  . Cancer Father   . Hypertension Father   . Heart disease Father   . Asthma Father   . Cancer Mother   . Hypertension Mother   . Pancreatic cancer Mother 38  . Cancer Sister   . Breast cancer Sister 38  . Breast cancer Sister 30  . Lung cancer Brother   . Pancreatic cancer Sister 62  . Cancer Sister     The following portions of the patient's history were reviewed and updated as appropriate: allergies, current medications, past family history, past medical history, past social history, past surgical history and problem list.  Review of Systems  ROS negative except as noted above. Information obtained from patient.   Objective:   BP 138/79   Pulse 94   Ht 5\' 2"  (1.575 m)   Wt 166 lb 11.2 oz (75.6 kg)   BMI 30.49 kg/m    CONSTITUTIONAL: Well-developed, well-nourished female in no acute distress.   PELVIC:  External Genitalia: Erythema with silver-gray streaks present, Excoriated  Vagina: Atrophic    MUSCULOSKELETAL: Normal range of motion. No tenderness.  No cyanosis, clubbing, or edema.  Recent Results (from the past 2160 hour(s))  POCT urinalysis dipstick     Status: Abnormal   Collection Time: 01/18/20  9:01 AM  Result Value Ref Range  Color, UA yellow    Clarity, UA  clear    Glucose, UA Positive (A) Negative   Bilirubin, UA neg    Ketones, UA trace    Spec Grav, UA 1.015 1.010 - 1.025   Blood, UA med ++    pH, UA 6.5 5.0 - 8.0   Protein, UA Negative Negative   Urobilinogen, UA 0.2 0.2 or 1.0 E.U./dL   Nitrite, UA neg    Leukocytes, UA Negative Negative   Appearance     Odor       Assessment:   1. Dysuria  - POCT urinalysis dipstick - Urine Culture - Candida 6 Species Profile, NAA  2. Need for influenza vaccination   3. Vulvar irritation  - Candida 6 Species Profile, NAA  4. Vaginal itching  - Candida 6 Species Profile, NAA  5. History of diabetes mellitus  - Candida 6 Species Profile, NAA     Plan:   Vaginal swab collected, will contact with results.   Rx Clobetasol, see orders.   Reviewed red flag symptoms and when to call.   RTC x 4 weeks for follow up visit or sooner if needed.    Dani Gobble, CNM [facility] 01/18/20 12:21 PM

## 2020-01-18 NOTE — Patient Instructions (Signed)
Clobetasol Propionate skin cream What is this medicine? CLOBETASOL (kloe BAY ta sol) is a corticosteroid. It is used on the skin to treat itching, redness, and swelling caused by some skin conditions. This medicine may be used for other purposes; ask your health care provider or pharmacist if you have questions. COMMON BRAND NAME(S): Clobetavix, Cormax, Embeline, Embeline E, Impoyz, Tasoprol, Temovate, Temovate E What should I tell my health care provider before I take this medicine? They need to know if you have any of these conditions:  any type of active infection including measles, tuberculosis, herpes, or chickenpox  circulation problems or vascular disease  large areas of burned or damaged skin  rosacea  skin wasting or thinning  an unusual or allergic reaction to clobetasol, corticosteroids, other medicines, foods, dyes, or preservatives  pregnant or trying to get pregnant  breast-feeding How should I use this medicine? This medicine is for external use only. Do not take by mouth. Follow the directions on the prescription label. Wash your hands before and after use. Apply a thin film of medicine to the affected area. Do not cover with a bandage or dressing unless your doctor or health care professional tells you to. Do not get this medicine in your eyes. If you do, rinse out with plenty of cool tap water. It is important not to use more medicine than prescribed. Do not use your medicine more often than directed. To do so may increase the chance of side effects. Talk to your pediatrician regarding the use of this medicine in children. Special care may be needed. Elderly patients are more likely to have damaged skin through aging, and this may increase side effects. This medicine should only be used for brief periods and infrequently in older patients. Overdosage: If you think you have taken too much of this medicine contact a poison control center or emergency room at once. NOTE:  This medicine is only for you. Do not share this medicine with others. What if I miss a dose? If you miss a dose, use it as soon as you can. If it is almost time for your next dose, use only that dose. Do not use double or extra doses. What may interact with this medicine? Interactions are not expected. Do not use cosmetics or other skin care products on the treated area. This list may not describe all possible interactions. Give your health care provider a list of all the medicines, herbs, non-prescription drugs, or dietary supplements you use. Also tell them if you smoke, drink alcohol, or use illegal drugs. Some items may interact with your medicine. What should I watch for while using this medicine? Tell your doctor or health care professional if your symptoms do not get better within 2 weeks, or if you develop skin irritation from the medicine. Tell your doctor or health care professional if you are exposed to anyone with measles or chickenpox, or if you develop sores or blisters that do not heal properly. What side effects may I notice from receiving this medicine? Side effects that you should report to your doctor or health care professional as soon as possible:  allergic reactions like skin rash, itching or hives, swelling of the face, lips, or tongue  changes in vision  lack of healing of the skin condition  painful, red, pus filled blisters on the skin or in hair follicles  thinning of the skin with easy bruising Side effects that usually do not require medical attention (report to your doctor or  health care professional if they continue or are bothersome):  burning, irritation of the skin  redness or scaling of the skin This list may not describe all possible side effects. Call your doctor for medical advice about side effects. You may report side effects to FDA at 1-800-FDA-1088. Where should I keep my medicine? Keep out of the reach of children. Store at room temperature  between 15 and 30 degrees C (59 and 86 degrees F). Keep away from heat and direct light. Do not freeze. Throw away any unused medicine after the expiration date. NOTE: This sheet is a summary. It may not cover all possible information. If you have questions about this medicine, talk to your doctor, pharmacist, or health care provider.  2020 Elsevier/Gold Standard (2007-06-21 16:56:45)

## 2020-01-21 LAB — URINE CULTURE

## 2020-01-21 NOTE — Progress Notes (Signed)
Please contact patient, urine culture negative for infection. Encourage MyChart activation. Thanks, JML

## 2020-01-22 ENCOUNTER — Telehealth: Payer: Self-pay

## 2020-01-22 NOTE — Telephone Encounter (Signed)
Left voicemail to pt that culture was negative for infection.

## 2020-01-23 LAB — CANDIDA 6 SPECIES PROFILE, NAA
C PARAPSILOSIS/TROPICALIS: NEGATIVE
Candida albicans, NAA: POSITIVE — AB
Candida glabrata, NAA: POSITIVE — AB
Candida krusei, NAA: NEGATIVE
Candida lusitaniae, NAA: NEGATIVE

## 2020-01-24 ENCOUNTER — Telehealth: Payer: Self-pay

## 2020-01-24 ENCOUNTER — Other Ambulatory Visit: Payer: Self-pay

## 2020-01-24 MED ORDER — FLUCONAZOLE 150 MG PO TABS: 150.0000 mg | ORAL_TABLET | Freq: Every day | ORAL | 0 refills | Status: AC

## 2020-01-24 NOTE — Progress Notes (Signed)
Please contact patient. Has yeast infection. Please order Diflucan 150 mg PO x 3 doses for her. Thanks, JML

## 2020-01-24 NOTE — Progress Notes (Signed)
Per Dani Gobble, CNM rx for Diflucan 150 mg po x 3 doses ordered.

## 2020-01-24 NOTE — Telephone Encounter (Signed)
Called pt and left voicemail that swab was positive for yeast and script for Diflucan x 3 doses sent to pharmacy on file.

## 2020-01-25 ENCOUNTER — Telehealth: Payer: Self-pay

## 2020-01-25 NOTE — Telephone Encounter (Signed)
Called pt to follow up and confirm she received my voicemail. Pt verbalized understanding and stated she picked up her script Diflucan. Pt states the clobetasol ointment is helping.

## 2020-02-05 NOTE — Progress Notes (Signed)
I,Candace [name],acting as a scribe for Candace Durie, MD.,have documented all relevant documentation on the behalf of Candace Durie, MD,as directed by  Candace Durie, MD while in the presence of Candace Durie, MD.   Established patient visit   Patient: Candace [name]   DOB: February 15, 1951   69 y.o. Female  MRN: [id number] Visit Date: 02/06/2020  Today's healthcare provider: [name], MD   Chief Complaint  Patient presents with  . Depression  . Diabetes  . Follow-up   Subjective    HPI  Patient feels well recently.  No significant problems. She is tolerating Jardiance  Diabetes Mellitus Type II, follow-up  Lab Results  Component Value Date   HGBA1C 12.4 (A) 12/06/2019   HGBA1C 9.4 (A) 09/04/2019   HGBA1C 9.8 (H) 08/30/2019   Last seen for diabetes 2 months ago.  Management since then includes; Very poor control with A1c of 12.4.  Started Jardiance of 10 mg daily and then increase to 25 mg daily. She reports good compliance with treatment. She is not having side effects. none  Home blood sugar records: fasting range: 250-157  Episodes of hypoglycemia? No none   Current insulin regiment: n/a Most Recent Eye Exam: 06/28/2019  -------------------------------------------------------------------- Patient states she has been doing well on Jardiance.   Depression, Follow-up  She  was last seen for this 2 months ago. Changes made at last visit include; Chronic ongoing problem.  Stable presently.   She reports good compliance with treatment. She is not having side effects. none  She reports good tolerance of treatment. Current symptoms include: n/a She feels she is Unchanged since last visit.  Depression screen [facility] 2/9 02/06/2020 07/12/2019 06/12/2019  Decreased Interest 1 1 1   Down, Depressed, Hopeless 0 0 1  PHQ - 2 Score 1 1 2   Altered sleeping 1 1 1   Tired, decreased energy 1 1 3   Change in appetite 1 0 3  Feeling bad or failure  about yourself  0 0 1  Trouble concentrating 0 0 2  Moving slowly or fidgety/restless 0 0 0  Suicidal thoughts 0 0 1  PHQ-9 Score 4 3 13   Difficult doing work/chores Not difficult at all Not difficult at all Not difficult at all  Some recent data might be hidden    --------------------------------------------------------------------  Patient states she has been having right lower pelvis and hip pain for the past two days. Patient is currently being treated for yeast infection.  .     Medications: Outpatient Medications Prior to Visit  Medication Sig  . acetaminophen (TYLENOL) 325 MG tablet Take 650 mg by mouth every 6 (six) hours as needed for moderate pain or headache.   Marland Kitchen atorvastatin (LIPITOR) 40 MG tablet Take 1 tablet (40 mg total) by mouth daily.  . clobetasol ointment (TEMOVATE) 1.94 % Apply 1 application topically 2 (two) times daily. Apply to affected area twice a day for one (1) week; then daily for one (1) week, then every other day for one (1), then follow up with CNM  . empagliflozin (JARDIANCE) 25 MG TABS tablet Take 1 tablet (25 mg total) by mouth daily before breakfast.  . isosorbide mononitrate (IMDUR) 60 MG 24 hr tablet Take 1 tablet (60 mg total) by mouth daily.  . Lancets (ONETOUCH DELICA PLUS [id number]) MISC USE UP TO 4 TIMES DAILY AS DIRECTED  . metoprolol tartrate (LOPRESSOR) 100 MG tablet Take 100 mg by mouth 2 (two) times daily.  . nitroGLYCERIN (  NITROSTAT) 0.4 MG SL tablet Place 1 tablet (0.4 mg total) under the tongue every 5 (five) minutes as needed for chest pain.  . pantoprazole (PROTONIX) 20 MG tablet Take 1 tablet (20 mg total) by mouth 2 (two) times daily.  . sertraline (ZOLOFT) 50 MG tablet TAKE 1 TABLET BY MOUTH EVERY DAY  . sucralfate (CARAFATE) 1 g tablet Take one tablet in the morning and one tablet in the evening.  . empagliflozin (JARDIANCE) 10 MG TABS tablet Take 1 tablet (10 mg total) by mouth daily. (Patient not taking: Reported on 02/06/2020)    No facility-administered medications prior to visit.    Review of Systems  Constitutional: Negative for appetite change, chills, fatigue and fever.  Respiratory: Negative for chest tightness and shortness of breath.   Cardiovascular: Negative for chest pain and palpitations.  Gastrointestinal: Negative for abdominal pain, nausea and vomiting.  Neurological: Negative for dizziness and weakness.    Last hemoglobin A1c Lab Results  Component Value Date   HGBA1C 12.4 (A) 12/06/2019      Objective    BP (!) 147/78 (BP Location: Right Arm, Patient Position: Sitting, Cuff Size: Large)   Pulse 84   Temp 98.4 F (36.9 C) (Oral)   Resp 18   Ht 5\' 2"  (1.575 m)   Wt 165 lb (74.8 kg)   SpO2 97%   BMI 30.18 kg/m  BP Readings from Last 3 Encounters:  02/06/20 (!) 147/78  01/18/20 138/79  12/06/19 104/67   Wt Readings from Last 3 Encounters:  02/06/20 165 lb (74.8 kg)  01/18/20 166 lb 11.2 oz (75.6 kg)  12/06/19 168 lb (76.2 kg)      Physical Exam Vitals reviewed.       No results found for any visits on 02/06/20.  Assessment & Plan     1. Type 2 diabetes mellitus with complication, without long-term current use of insulin (HCC) Follow-up on A1c on next visit after Jardiance started - Comprehensive Metabolic Panel (CMET)  2. Coronary artery disease of bypass graft of native heart with stable angina pectoris ([place]) All risk factors treated. - Comprehensive Metabolic Panel (CMET)  3. Benign essential HTN  - Comprehensive Metabolic Panel (CMET)  4. Right lower quadrant abdominal pain Normal exam today.  Work up or refer as indicated. - POCT urinalysis dipstick--Abnormal - Comprehensive Metabolic Panel (CMET)   Return in about 2 months (around 04/07/2020).         Treena Cosman Cranford Mon, MD  [facility] [phone] (phone) [phone] (fax)  [place]

## 2020-02-06 ENCOUNTER — Other Ambulatory Visit: Payer: Self-pay

## 2020-02-06 ENCOUNTER — Encounter: Payer: Self-pay | Admitting: Family Medicine

## 2020-02-06 ENCOUNTER — Ambulatory Visit (INDEPENDENT_AMBULATORY_CARE_PROVIDER_SITE_OTHER): Payer: Medicare Other | Admitting: Family Medicine

## 2020-02-06 VITALS — BP 147/78 | HR 84 | Temp 98.4°F | Resp 18 | Ht 62.0 in | Wt 165.0 lb

## 2020-02-06 DIAGNOSIS — R1031 Right lower quadrant pain: Secondary | ICD-10-CM | POA: Diagnosis not present

## 2020-02-06 DIAGNOSIS — I25708 Atherosclerosis of coronary artery bypass graft(s), unspecified, with other forms of angina pectoris: Secondary | ICD-10-CM | POA: Diagnosis not present

## 2020-02-06 DIAGNOSIS — I10 Essential (primary) hypertension: Secondary | ICD-10-CM | POA: Diagnosis not present

## 2020-02-06 DIAGNOSIS — E118 Type 2 diabetes mellitus with unspecified complications: Secondary | ICD-10-CM

## 2020-02-06 LAB — POCT URINALYSIS DIPSTICK
Bilirubin, UA: NEGATIVE
Glucose, UA: POSITIVE — AB
Ketones, UA: POSITIVE
Leukocytes, UA: NEGATIVE
Nitrite, UA: NEGATIVE
Protein, UA: POSITIVE — AB
Spec Grav, UA: 1.01 (ref 1.010–1.025)
Urobilinogen, UA: 0.2 E.U./dL
pH, UA: 6 (ref 5.0–8.0)

## 2020-02-06 NOTE — Telephone Encounter (Signed)
Jardiance 25. Consider 1 of Continuous Glucose Monitors such as freestyle libre.

## 2020-02-06 NOTE — Telephone Encounter (Signed)
Patient was seen in office today.  

## 2020-02-07 LAB — COMPREHENSIVE METABOLIC PANEL
ALT: 13 IU/L (ref 0–32)
AST: 18 IU/L (ref 0–40)
Albumin/Globulin Ratio: 1.8 (ref 1.2–2.2)
Albumin: 4.4 g/dL (ref 3.8–4.8)
Alkaline Phosphatase: 135 IU/L — ABNORMAL HIGH (ref 44–121)
BUN/Creatinine Ratio: 17 (ref 12–28)
BUN: 10 mg/dL (ref 8–27)
Bilirubin Total: 0.2 mg/dL (ref 0.0–1.2)
CO2: 19 mmol/L — ABNORMAL LOW (ref 20–29)
Calcium: 9.4 mg/dL (ref 8.7–10.3)
Chloride: 106 mmol/L (ref 96–106)
Creatinine, Ser: 0.6 mg/dL (ref 0.57–1.00)
GFR calc Af Amer: 108 mL/min/{1.73_m2} (ref >59)
GFR calc non Af Amer: 94 mL/min/{1.73_m2} (ref >59)
Globulin, Total: 2.5 g/dL (ref 1.5–4.5)
Glucose: 141 mg/dL — ABNORMAL HIGH (ref 65–99)
Potassium: 3.8 mmol/L (ref 3.5–5.2)
Sodium: 142 mmol/L (ref 134–144)
Total Protein: 6.9 g/dL (ref 6.0–8.5)

## 2020-02-10 DIAGNOSIS — M791 Myalgia, unspecified site: Secondary | ICD-10-CM | POA: Diagnosis not present

## 2020-02-10 DIAGNOSIS — J069 Acute upper respiratory infection, unspecified: Secondary | ICD-10-CM | POA: Diagnosis not present

## 2020-02-10 DIAGNOSIS — Z20822 Contact with and (suspected) exposure to covid-19: Secondary | ICD-10-CM | POA: Diagnosis not present

## 2020-02-12 ENCOUNTER — Telehealth: Payer: Self-pay

## 2020-02-12 NOTE — Telephone Encounter (Signed)
Pt advised.   Thanks,   -Raetta Agostinelli  

## 2020-02-12 NOTE — Telephone Encounter (Signed)
-----   Message from [name]., MD sent at 02/08/2020  8:02 AM EST ----- Labs good.

## 2020-02-14 ENCOUNTER — Other Ambulatory Visit: Payer: Self-pay

## 2020-02-14 ENCOUNTER — Encounter: Payer: Self-pay | Admitting: Obstetrics and Gynecology

## 2020-02-14 ENCOUNTER — Ambulatory Visit (INDEPENDENT_AMBULATORY_CARE_PROVIDER_SITE_OTHER): Payer: Medicare Other | Admitting: Obstetrics and Gynecology

## 2020-02-14 VITALS — BP 122/74 | HR 80 | Ht 62.0 in | Wt 167.2 lb

## 2020-02-14 DIAGNOSIS — B372 Candidiasis of skin and nail: Secondary | ICD-10-CM | POA: Diagnosis not present

## 2020-02-14 DIAGNOSIS — Z8639 Personal history of other endocrine, nutritional and metabolic disease: Secondary | ICD-10-CM | POA: Diagnosis not present

## 2020-02-14 DIAGNOSIS — R1031 Right lower quadrant pain: Secondary | ICD-10-CM | POA: Diagnosis not present

## 2020-02-14 MED ORDER — NYSTATIN 100000 UNIT/GM EX POWD: 1.0000 "application " | Freq: Every day | CUTANEOUS | 5 refills | Status: DC

## 2020-02-14 NOTE — Progress Notes (Signed)
Pt present for follow up. Pt was seen by [facility] for yeast infection in the pelvic area. Pt stated that it has cleared up since then.

## 2020-02-14 NOTE — Progress Notes (Signed)
    GYNECOLOGY PROGRESS NOTE  Subjective:    Patient ID: Candace [name], female    DOB: [birth year], 69 y.o.   MRN: [id number]  HPI  Patient is a 69 y.o. G8P3200 female who presents for 4 week f/u of recent skin rash (yeast). She was evaluated by midwife Dani Gobble, CNM, and treated with oral Diflucan and Clobetasol cream.  Patient states she is feeling much better, no longer has itching.  States she is working on improving her diabetes as it is currently uncontrolled.    Aside from this, patient notes ocassional twinge sof pain in RLQ. Wonders if it could be due to scar tissue from her hysterectomy. Has recently been ruled out for UTI.    The following portions of the patient's history were reviewed and updated as appropriate: allergies, current medications, past family history, past medical history, past social history, past surgical history and problem list.  Review of Systems Pertinent items noted in HPI and remainder of comprehensive ROS otherwise negative.   Objective:   Blood pressure 122/74, pulse 80, height 5\' 2"  (1.575 m), weight 167 lb 3.2 oz (75.8 kg). General appearance: alert and no distress Abdomen: soft, non-tender; bowel sounds normal; no masses,  no organomegaly Pelvic: External genitalia with faint residual demarcation of skin rash along vulva, perineum, and inner thighs bilaterally. Overall well-healed tissue.  No erythema or tenderness present. Internal exam not performed.    Assessment:   Skin rash (yeast) History of DM RLQ pain  Plan:   1. Skin rash mostly resolved. Can discontinue treatment at this time.  2. History of DM, encouraged patient to work on improving control with PCP.  3. RLQ pain, unclear cause. Has had hysterectomy with BSO, denies urinary symptoms and recently ruled out for UTI.  Denies constipation.  Could likely be due to scar tissue from prior surgery.  Patient notes it is not very bothersome, just desired to mention it today.     Return to clinic for any scheduled appointments or for any gynecologic concerns as needed.    Rubie Maid, MD [facility]

## 2020-02-28 DIAGNOSIS — I214 Non-ST elevation (NSTEMI) myocardial infarction: Secondary | ICD-10-CM | POA: Diagnosis not present

## 2020-02-28 DIAGNOSIS — I10 Essential (primary) hypertension: Secondary | ICD-10-CM | POA: Diagnosis not present

## 2020-02-28 DIAGNOSIS — I25118 Atherosclerotic heart disease of native coronary artery with other forms of angina pectoris: Secondary | ICD-10-CM | POA: Diagnosis not present

## 2020-02-28 DIAGNOSIS — I6523 Occlusion and stenosis of bilateral carotid arteries: Secondary | ICD-10-CM | POA: Diagnosis not present

## 2020-02-28 DIAGNOSIS — I480 Paroxysmal atrial fibrillation: Secondary | ICD-10-CM | POA: Diagnosis not present

## 2020-03-07 ENCOUNTER — Other Ambulatory Visit: Payer: Self-pay | Admitting: Family Medicine

## 2020-03-07 DIAGNOSIS — E118 Type 2 diabetes mellitus with unspecified complications: Secondary | ICD-10-CM

## 2020-03-26 ENCOUNTER — Telehealth: Payer: Self-pay | Admitting: *Deleted

## 2020-03-26 NOTE — Chronic Care Management (AMB) (Signed)
  Chronic Care Management   Note  03/26/2020 Name: Candace [name] MRN: [id number] DOB: 1950/10/26  Candace [name] is a 69 y.o. year old female who is a primary care patient of [name]., MD. I reached out to [name] by phone today in response to a referral sent by [name]'s health plan.     [name] was given information about Chronic Care Management services today including:  1. CCM service includes personalized support from designated clinical staff supervised by her physician, including individualized plan of care and coordination with other care providers 2. 24/7 contact phone numbers for assistance for urgent and routine care needs. 3. Service will only be billed when office clinical staff spend 20 minutes or more in a month to coordinate care. 4. Only one practitioner may furnish and bill the service in a calendar month. 5. The patient may stop CCM services at any time (effective at the end of the month) by phone call to the office staff. 6. The patient will be responsible for cost sharing (co-pay) of up to 20% of the service fee (after annual deductible is met).  Patient agreed to services and verbal consent obtained.   Follow up plan: Telephone appointment with care management team member scheduled for:04/15/2020  [place] [facility]  Direct Dial: [phone]

## 2020-04-02 ENCOUNTER — Other Ambulatory Visit: Payer: Self-pay

## 2020-04-02 ENCOUNTER — Ambulatory Visit (INDEPENDENT_AMBULATORY_CARE_PROVIDER_SITE_OTHER): Payer: HMO

## 2020-04-02 DIAGNOSIS — Z0000 Encounter for general adult medical examination without abnormal findings: Secondary | ICD-10-CM | POA: Diagnosis not present

## 2020-04-02 NOTE — Progress Notes (Signed)
Subjective:   Candace [name] is a 70 y.o. female who presents for Medicare Annual (Subsequent) preventive examination.  I connected with Candace [name] today by telephone and verified that I am speaking with the correct person using two identifiers. Location patient: home Location provider: work Persons participating in the virtual visit: patient, provider.   I discussed the limitations, risks, security and privacy concerns of performing an evaluation and management service by telephone and the availability of in person appointments. I also discussed with the patient that there may be a patient responsible charge related to this service. The patient expressed understanding and verbally consented to this telephonic visit.    Interactive audio and video telecommunications were attempted between this provider and patient, however failed, due to patient having technical difficulties OR patient did not have access to video capability.  We continued and completed visit with audio only.   Review of Systems    N/A  Cardiac Risk Factors include: advanced age (>28men, >16 women);diabetes mellitus;dyslipidemia;hypertension     Objective:    There were no vitals filed for this visit. There is no height or weight on file to calculate BMI.  Advanced Directives 04/02/2020 08/30/2019 08/30/2019 08/29/2019 06/10/2019 05/08/2019 04/05/2019  Does Patient Have a Medical Advance Directive? Yes Yes Yes Yes Yes No No  Type of Paramedic of [place];Living will Healthcare Power of [place];Living will Out of facility DNR (pink MOST or yellow form) Living will - -  Does patient want to make changes to medical advance directive? - No - Patient declined - - - - No - Patient declined  Copy of [place] in Chart? No - copy requested No - copy requested Yes - validated most recent copy scanned in chart (See row information) - - - -  Would patient like  information on creating a medical advance directive? - - - - - No - Patient declined No - Patient declined    Current Medications (verified) Outpatient Encounter Medications as of 04/02/2020  Medication Sig  . acetaminophen (TYLENOL) 325 MG tablet Take 650 mg by mouth every 6 (six) hours as needed for moderate pain or headache.   Marland Kitchen atorvastatin (LIPITOR) 40 MG tablet Take 1 tablet (40 mg total) by mouth daily.  . brompheniramine-pseudoephedrine-DM 30-2-10 MG/5ML syrup Take 5 mLs by mouth 3 (three) times daily as needed.  . clobetasol ointment (TEMOVATE) [id number] % Apply 1 application topically 2 (two) times daily. Apply to affected area twice a day for one (1) week; then daily for one (1) week, then every other day for one (1), then follow up with CNM  . ELIQUIS 5 MG TABS tablet Take 5 mg by mouth 2 (two) times daily.  . isosorbide mononitrate (IMDUR) 60 MG 24 hr tablet Take 1 tablet (60 mg total) by mouth daily.  Marland Kitchen JARDIANCE 25 MG TABS tablet TAKE 1 TABLET (25 MG TOTAL) BY MOUTH DAILY BEFORE BREAKFAST.  Marland Kitchen Lancets (ONETOUCH DELICA PLUS [id number]) MISC USE UP TO 4 TIMES DAILY AS DIRECTED  . loperamide (IMODIUM) 2 MG capsule Take 2 mg by mouth as needed.  . metoprolol tartrate (LOPRESSOR) 100 MG tablet Take 100 mg by mouth 2 (two) times daily.  . nitroGLYCERIN (NITROSTAT) 0.4 MG SL tablet Place 1 tablet (0.4 mg total) under the tongue every 5 (five) minutes as needed for chest pain.  Marland Kitchen nystatin (NYSTATIN) powder Apply 1 application topically daily.  . ondansetron (ZOFRAN-ODT) 8 MG disintegrating tablet Take  8 mg by mouth 3 (three) times daily.  . sertraline (ZOLOFT) 50 MG tablet TAKE 1 TABLET BY MOUTH EVERY DAY  . empagliflozin (JARDIANCE) 10 MG TABS tablet Take 1 tablet (10 mg total) by mouth daily.  . pantoprazole (PROTONIX) 20 MG tablet Take 1 tablet (20 mg total) by mouth 2 (two) times daily. (Patient not taking: Reported on 04/02/2020)  . sucralfate (CARAFATE) 1 g tablet Take one tablet in the  morning and one tablet in the evening. (Patient not taking: Reported on 04/02/2020)  . [DISCONTINUED] cetirizine (ZYRTEC) 5 MG tablet Take 2 tablets (10 mg total) by mouth daily.   No facility-administered encounter medications on file as of 04/02/2020.    Allergies (verified) Lisinopril and Penicillins   History: Past Medical History:  Diagnosis Date  . Allergy   . Anemia   . Anxiety   . Arrhythmia   . Arthritis   . Atrial fibrillation ([place])   . Coronary artery disease   . Depression   . Diabetes mellitus without complication ([place])   . Dyspnea    doe  . Dysrhythmia   . GERD (gastroesophageal reflux disease)   . Headache   . History of hiatal hernia   . Hyperlipidemia   . Hypertension   . Myocardial infarction ([place])    2016, 04/2017  . Myocardial infarction with cardiac rehabilitation [facility])    MI 2016/ CABG 8/17    FINISHED CARDIAC REHAB 3 WEEKS AGO  . Panic attack   . Pneumonia   . Reflux   . Stroke [facility]) 2015   showed up on MRI; no weakness noted  . TIA (transient ischemic attack)   . Voice tremor    Past Surgical History:  Procedure Laterality Date  . ABDOMINAL HYSTERECTOMY    . APPENDECTOMY  1975  . ARTERY BIOPSY Right 04/26/2016   Procedure: BIOPSY TEMPORAL ARTERY;  Surgeon: Margaretha Sheffield, MD;  Location: [facility] ORS;  Service: ENT;  Laterality: Right;  . CARDIAC CATHETERIZATION N/A 11/06/2015   Procedure: Left Heart Cath and Coronary Angiography;  Surgeon: Corey Skains, MD;  Location: [place] [facility];  Service: Cardiovascular;  Laterality: N/A;  . CESAREAN SECTION    . COLONOSCOPY  2015  . COLONOSCOPY WITH PROPOFOL N/A 11/03/2018   Procedure: COLONOSCOPY WITH PROPOFOL;  Surgeon: Virgel Manifold, MD;  Location: [facility] ENDOSCOPY;  Service: Endoscopy;  Laterality: N/A;  . CORONARY ANGIOPLASTY  04/2017   [place]  . CORONARY ARTERY BYPASS GRAFT N/A 11/10/2015   Procedure: CORONARY ARTERY BYPASS GRAFTING (CABG), ON PUMP, TIMES FOUR, USING LEFT  INTERNAL MAMMARY ARTERY, BILATERAL GREATER SAPHENOUS VEINS HARVESTED ENDOSCOPICALLY;  Surgeon: [name], MD;  Location: [place];  Service: Open Heart Surgery;  Laterality: N/A;  LIMA-LAD; SEQ SVG-OM1-OM2; SVG-PL  . CORONARY STENT INTERVENTION N/A 08/05/2016   Procedure: Coronary Stent Intervention;  Surgeon: Isaias Cowman, MD;  Location: [place] [facility];  Service: Cardiovascular;  Laterality: N/A;  . ESOPHAGOGASTRODUODENOSCOPY (EGD) WITH PROPOFOL N/A 11/03/2018   Procedure: ESOPHAGOGASTRODUODENOSCOPY (EGD) WITH PROPOFOL;  Surgeon: Virgel Manifold, MD;  Location: [facility] ENDOSCOPY;  Service: Endoscopy;  Laterality: N/A;  . HYSTERECTOMY ABDOMINAL WITH SALPINGO-OOPHORECTOMY Bilateral 08/15/2017   Procedure: HYSTERECTOMY ABDOMINAL WITH BILATERAL SALPINGO-OOPHORECTOMY;  Surgeon: Rubie Maid, MD;  Location: [facility];  Service: Gynecology;  Laterality: Bilateral;  . LEFT HEART CATH AND CORONARY ANGIOGRAPHY N/A 08/05/2016   Procedure: Left Heart Cath and Coronary Angiography;  Surgeon: Isaias Cowman, MD;  Location: [place] [facility];  Service: Cardiovascular;  Laterality: N/A;  . LEFT HEART CATH AND CORS/GRAFTS ANGIOGRAPHY N/A 09/19/2018   Procedure: LEFT HEART CATH AND CORS/GRAFTS ANGIOGRAPHY;  Surgeon: Corey Skains, MD;  Location: [place] [facility];  Service: Cardiovascular;  Laterality: N/A;  . LEFT HEART CATH AND CORS/GRAFTS ANGIOGRAPHY N/A 08/30/2019   Procedure: LEFT HEART CATH AND CORS/GRAFTS ANGIOGRAPHY;  Surgeon: Teodoro Spray, MD;  Location: [place] [facility];  Service: Cardiovascular;  Laterality: N/A;  . OOPHORECTOMY    . TEE WITHOUT CARDIOVERSION N/A 11/10/2015   Procedure: TRANSESOPHAGEAL ECHOCARDIOGRAM (TEE);  Surgeon: [name], MD;  Location: [place];  Service: Open Heart Surgery;  Laterality: N/A;  . TUBAL LIGATION     Family History  Problem Relation Age of Onset  . Cancer Father   . Hypertension Father   . Heart disease Father   . Asthma  Father   . Cancer Mother   . Hypertension Mother   . Pancreatic cancer Mother 48  . Cancer Sister   . Breast cancer Sister 29  . Breast cancer Sister 79  . Lung cancer Brother   . Pancreatic cancer Sister 35  . Cancer Sister    Social History   Socioeconomic History  . Marital status: Divorced    Spouse name: Not on file  . Number of children: 3  . Years of education: Not on file  . Highest education level: Some college, no degree  Occupational History  . Occupation: retired  Tobacco Use  . Smoking status: Former Smoker    Packs/day: 0.50    Types: Cigarettes    Quit date: 10/07/2001    Years since quitting: 18.4  . Smokeless tobacco: Never Used  Vaping Use  . Vaping Use: Never used  Substance and Sexual Activity  . Alcohol use: No    Alcohol/week: 0.0 standard drinks  . Drug use: No  . Sexual activity: Not Currently  Other Topics Concern  . Not on file  Social History Narrative   Lives at home alone   Social Determinants of Health   Financial Resource Strain: Low Risk   . Difficulty of Paying Living Expenses: Not hard at all  Food Insecurity: No Food Insecurity  . Worried About Charity fundraiser in the Last Year: Never true  . Ran Out of Food in the Last Year: Never true  Transportation Needs: No Transportation Needs  . Lack of Transportation (Medical): No  . Lack of Transportation (Non-Medical): No  Physical Activity: Inactive  . Days of Exercise per Week: 0 days  . Minutes of Exercise per Session: 0 min  Stress: No Stress Concern Present  . Feeling of Stress : Not at all  Social Connections: Moderately Isolated  . Frequency of Communication with Friends and Family: More than three times a week  . Frequency of Social Gatherings with Friends and Family: More than three times a week  . Attends Religious Services: More than 4 times per year  . Active Member of Clubs or Organizations: No  . Attends Archivist Meetings: Never  . Marital Status:  Divorced    Tobacco Counseling Counseling given: Not Answered   Clinical Intake:  Pre-visit preparation completed: Yes  Pain : No/denies pain     Nutritional Risks: None Diabetes: Yes  How often do you need to have someone help you when you read instructions, pamphlets, or other written materials from your doctor or pharmacy?: 1 - Never  Diabetic? Yes  Nutrition Risk Assessment:  Has the patient  had any N/V/D within the last 2 months?  No  Does the patient have any non-healing wounds?  No  Has the patient had any unintentional weight loss or weight gain?  No   Diabetes:  Is the patient diabetic?  Yes  If diabetic, was a CBG obtained today?  No  Did the patient bring in their glucometer from home?  No  How often do you monitor your CBG's? Once a day.   Financial Strains and Diabetes Management:  Are you having any financial strains with the device, your supplies or your medication? No .  Does the patient want to be seen by Chronic Care Management for management of their diabetes?  No  Would the patient like to be referred to a Nutritionist or for Diabetic Management?  No   Diabetic Exams:  Diabetic Eye Exam: Completed 06/28/19 Diabetic Foot Exam: Completed 10/27/19.   Interpreter Needed?: No  Information entered by :: [facility], LPN   Activities of Daily Living In your present state of health, do you have any difficulty performing the following activities: 04/02/2020 08/30/2019  Hearing? N N  Vision? N N  Difficulty concentrating or making decisions? N N  Walking or climbing stairs? N N  Dressing or bathing? N N  Doing errands, shopping? N N  Preparing Food and eating ? N -  Using the Toilet? N -  In the past six months, have you accidently leaked urine? N -  Do you have problems with loss of bowel control? N -  Managing your Medications? N -  Managing your Finances? N -  Housekeeping or managing your Housekeeping? N -  Some recent data might be hidden     Patient Care Team: [name]., MD as PCP - General (Family Medicine) Corey Skains, MD as Consulting Physician (Internal Medicine) Sharlotte Alamo, DPM (Podiatry) Neldon Labella, RN as Registered Nurse Virgel Manifold, MD as Consulting Physician (Gastroenterology) [place], [place] any recent Medical Services you may have received from other than [facility] providers in the past year (date may be approximate).     Assessment:   This is a routine wellness examination for Candace [name].  Hearing/Vision screen No exam data present  Dietary issues and exercise activities discussed: Current Exercise Habits: The patient does not participate in regular exercise at present, Exercise limited by: None identified  Goals   None    Depression Screen PHQ 2/9 Scores 04/02/2020 02/06/2020 07/12/2019 06/12/2019 03/27/2019 03/27/2019 03/16/2018  PHQ - 2 Score 0 1 1 2  0 0 2  PHQ- 9 Score - 4 3 13  - - 4    Fall Risk Fall Risk  04/02/2020 02/06/2020 03/27/2019 03/16/2018 11/24/2017  Falls in the past year? 0 0 0 0 No  Number falls in past yr: 0 0 0 0 -  Injury with Fall? 0 0 0 0 -  Risk Factor Category  - - - - -  Risk for fall due to : - - - - Impaired balance/gait;Impaired mobility  Follow up - Falls evaluation completed - - -    FALL RISK PREVENTION PERTAINING TO THE HOME:  Any stairs in or around the home? No  If so, are there any without handrails? No  Home free of loose throw rugs in walkways, pet beds, electrical cords, etc? Yes  Adequate lighting in your home to reduce risk of falls? Yes   ASSISTIVE DEVICES UTILIZED TO PREVENT FALLS:  Life alert? No  Use of a  cane, walker or w/c? No  Grab bars in the bathroom? No  Shower chair or bench in shower? No  Elevated toilet seat or a handicapped toilet? Yes    Cognitive Function: Normal cognitive status assessed by observation by this Nurse Health Advisor. No abnormalities found.   MMSE - Mini Mental State Exam  04/19/2016  Orientation to time 5  Orientation to Place 5  Registration 3  Attention/ Calculation 4  Recall 3  Language- name 2 objects 2  Language- repeat 1  Language- follow 3 step command 3  Language- read & follow direction 1  Write a sentence 1  Copy design 1  Total score 29     6CIT Screen 03/27/2019 03/16/2018  What Year? 0 points 0 points  What month? 0 points 0 points  What time? 0 points 0 points  Count back from 20 0 points 0 points  Months in reverse 0 points 0 points  Repeat phrase 0 points 0 points  Total Score 0 0    Immunizations Immunization History  Administered Date(s) Administered  . Fluad Quad(high Dose 65+) 12/28/2018  . Influenza, High Dose Seasonal PF 12/24/2016, 12/08/2017  . Influenza,inj,Quad PF,6+ Mos 12/19/2015, 01/18/2020  . Influenza-Unspecified 03/06/2015  . Moderna Sars-Covid-2 Vaccination 04/14/2019, 05/12/2019, 01/28/2020  . Pneumococcal Conjugate-13 03/16/2018  . Pneumococcal Polysaccharide-23 12/24/2016  . Tdap 07/28/2010    TDAP status: Up to date  Flu Vaccine status: Up to date  Pneumococcal vaccine status: Up to date  Covid-19 vaccine status: Completed vaccines  Qualifies for Shingles Vaccine? Yes   Zostavax completed No   Shingrix Completed?: No.    Education has been provided regarding the importance of this vaccine. Patient has been advised to call insurance company to determine out of pocket expense if they have not yet received this vaccine. Advised may also receive vaccine at local pharmacy or Health Dept. Verbalized acceptance and understanding.  Screening Tests Health Maintenance  Topic Date Due  . URINE MICROALBUMIN  Never done  . MAMMOGRAM  12/31/2019  . HEMOGLOBIN A1C  06/04/2020  . OPHTHALMOLOGY EXAM  06/27/2020  . TETANUS/TDAP  07/27/2020  . FOOT EXAM  10/26/2020  . DEXA SCAN  05/11/2023  . COLONOSCOPY (Pts 45-10yrs Insurance coverage will need to be confirmed)  11/03/2023  . INFLUENZA VACCINE  Completed   . COVID-19 Vaccine  Completed  . Hepatitis C Screening  Completed  . PNA vac Low Risk Adult  Completed    Health Maintenance  Health Maintenance Due  Topic Date Due  . URINE MICROALBUMIN  Never done  . MAMMOGRAM  12/31/2019    Colorectal cancer screening: Type of screening: Colonoscopy. Completed 11/03/18. Repeat every 5 years  Mammogram status: Currently due. Declined order at this time.   Bone Density status: Completed 05/10/18. Results reflect: Bone density results: OSTEOPENIA. Repeat every 5 years.  Lung Cancer Screening: (Low Dose CT Chest recommended if Age 30-80 years, 30 pack-year currently smoking OR have quit w/in 15years.) does not qualify.   Additional Screening:  Hepatitis C Screening: Up to date  Vision Screening: Recommended annual ophthalmology exams for early detection of glaucoma and other disorders of the eye. Is the patient up to date with their annual eye exam?  Yes  Who is the provider or what is the name of the office in which the patient attends annual eye exams? [facility] If pt is not established with a provider, would they like to be referred to a provider to establish care? No .  Dental Screening: Recommended annual dental exams for proper oral hygiene  Community Resource Referral / Chronic Care Management: CRR required this visit?  No   CCM required this visit?  No      Plan:     I have personally reviewed and noted the following in the patient's chart:   . Medical and social history . Use of alcohol, tobacco or illicit drugs  . Current medications and supplements . Functional ability and status . Nutritional status . Physical activity . Advanced directives . List of other physicians . Hospitalizations, surgeries, and ER visits in previous 12 months . Vitals . Screenings to include cognitive, depression, and falls . Referrals and appointments  In addition, I have reviewed and discussed with patient certain preventive  protocols, quality metrics, and best practice recommendations. A written personalized care plan for preventive services as well as general preventive health recommendations were provided to patient.     Binyamin Nelis [place], [place]   11/02/6809   Nurse Notes: Pt needs a urine check at next in office apt. Pt declined a mammogram order at this time.

## 2020-04-02 NOTE — Patient Instructions (Signed)
Candace [name] , Thank you for taking time to come for your Medicare Wellness Visit. I appreciate your ongoing commitment to your health goals. Please review the following plan we discussed and let me know if I can assist you in the future.   Screening recommendations/referrals: Colonoscopy: Up to date, due 10/2023 Mammogram: Currently due, declined at this time.  Bone Density: Up to date, due 04/2023 Recommended yearly ophthalmology/optometry visit for glaucoma screening and checkup Recommended yearly dental visit for hygiene and checkup  Vaccinations: Influenza vaccine: Done 01/18/20 Pneumococcal vaccine: Completed series Tdap vaccine: Up to date, due 07/2020 Shingles vaccine: Shingrix discussed. Please contact your pharmacy for coverage information.     Advanced directives: Please bring a copy of your POA (Power of Attorney) and/or Living Will to your next appointment.   Conditions/risks identified: None.  Next appointment: 04/15/20 @ 9:00 AM for a CCM call    Preventive Care 65 Years and Older, Female Preventive care refers to lifestyle choices and visits with your health care provider that can promote health and wellness. What does preventive care include?  A yearly physical exam. This is also called an annual well check.  Dental exams once or twice a year.  Routine eye exams. Ask your health care provider how often you should have your eyes checked.  Personal lifestyle choices, including:  Daily care of your teeth and gums.  Regular physical activity.  Eating a healthy diet.  Avoiding tobacco and drug use.  Limiting alcohol use.  Practicing safe sex.  Taking low-dose aspirin every day.  Taking vitamin and mineral supplements as recommended by your health care provider. What happens during an annual well check? The services and screenings done by your health care provider during your annual well check will depend on your age, overall health, lifestyle risk factors,  and family history of disease. Counseling  Your health care provider may ask you questions about your:  Alcohol use.  Tobacco use.  Drug use.  Emotional well-being.  Home and relationship well-being.  Sexual activity.  Eating habits.  History of falls.  Memory and ability to understand (cognition).  Work and work Astronomer.  Reproductive health. Screening  You may have the following tests or measurements:  Height, weight, and BMI.  Blood pressure.  Lipid and cholesterol levels. These may be checked every 5 years, or more frequently if you are over 24 years old.  Skin check.  Lung cancer screening. You may have this screening every year starting at age 71 if you have a 30-pack-year history of smoking and currently smoke or have quit within the past 15 years.  Fecal occult blood test (FOBT) of the stool. You may have this test every year starting at age 62.  Flexible sigmoidoscopy or colonoscopy. You may have a sigmoidoscopy every 5 years or a colonoscopy every 10 years starting at age 48.  Hepatitis C blood test.  Hepatitis B blood test.  Sexually transmitted disease (STD) testing.  Diabetes screening. This is done by checking your blood sugar (glucose) after you have not eaten for a while (fasting). You may have this done every 1-3 years.  Bone density scan. This is done to screen for osteoporosis. You may have this done starting at age 84.  Mammogram. This may be done every 1-2 years. Talk to your health care provider about how often you should have regular mammograms. Talk with your health care provider about your test results, treatment options, and if necessary, the need for more tests. Vaccines  Your health care provider may recommend certain vaccines, such as:  Influenza vaccine. This is recommended every year.  Tetanus, diphtheria, and acellular pertussis (Tdap, Td) vaccine. You may need a Td booster every 10 years.  Zoster vaccine. You may need  this after age 33.  Pneumococcal 13-valent conjugate (PCV13) vaccine. One dose is recommended after age 32.  Pneumococcal polysaccharide (PPSV23) vaccine. One dose is recommended after age 31. Talk to your health care provider about which screenings and vaccines you need and how often you need them. This information is not intended to replace advice given to you by your health care provider. Make sure you discuss any questions you have with your health care provider. Document Released: 04/11/2015 Document Revised: 12/03/2015 Document Reviewed: 01/14/2015 Elsevier Interactive Patient Education  2017 [place] Prevention in the Home Falls can cause injuries. They can happen to people of all ages. There are many things you can do to make your home safe and to help prevent falls. What can I do on the outside of my home?  Regularly fix the edges of walkways and driveways and fix any cracks.  Remove anything that might make you trip as you walk through a door, such as a raised step or threshold.  Trim any bushes or trees on the path to your home.  Use bright outdoor lighting.  Clear any walking paths of anything that might make someone trip, such as rocks or tools.  Regularly check to see if handrails are loose or broken. Make sure that both sides of any steps have handrails.  Any raised decks and porches should have guardrails on the edges.  Have any leaves, snow, or ice cleared regularly.  Use sand or salt on walking paths during winter.  Clean up any spills in your garage right away. This includes oil or grease spills. What can I do in the bathroom?  Use night lights.  Install grab bars by the toilet and in the tub and shower. Do not use towel bars as grab bars.  Use non-skid mats or decals in the tub or shower.  If you need to sit down in the shower, use a plastic, non-slip stool.  Keep the floor dry. Clean up any water that spills on the floor as soon as it  happens.  Remove soap buildup in the tub or shower regularly.  Attach bath mats securely with double-sided non-slip rug tape.  Do not have throw rugs and other things on the floor that can make you trip. What can I do in the bedroom?  Use night lights.  Make sure that you have a light by your bed that is easy to reach.  Do not use any sheets or blankets that are too big for your bed. They should not hang down onto the floor.  Have a firm chair that has side arms. You can use this for support while you get dressed.  Do not have throw rugs and other things on the floor that can make you trip. What can I do in the kitchen?  Clean up any spills right away.  Avoid walking on wet floors.  Keep items that you use a lot in easy-to-reach places.  If you need to reach something above you, use a strong step stool that has a grab bar.  Keep electrical cords out of the way.  Do not use floor polish or wax that makes floors slippery. If you must use wax, use non-skid floor wax.  Do  not have throw rugs and other things on the floor that can make you trip. What can I do with my stairs?  Do not leave any items on the stairs.  Make sure that there are handrails on both sides of the stairs and use them. Fix handrails that are broken or loose. Make sure that handrails are as long as the stairways.  Check any carpeting to make sure that it is firmly attached to the stairs. Fix any carpet that is loose or worn.  Avoid having throw rugs at the top or bottom of the stairs. If you do have throw rugs, attach them to the floor with carpet tape.  Make sure that you have a light switch at the top of the stairs and the bottom of the stairs. If you do not have them, ask someone to add them for you. What else can I do to help prevent falls?  Wear shoes that:  Do not have high heels.  Have rubber bottoms.  Are comfortable and fit you well.  Are closed at the toe. Do not wear sandals.  If you  use a stepladder:  Make sure that it is fully opened. Do not climb a closed stepladder.  Make sure that both sides of the stepladder are locked into place.  Ask someone to hold it for you, if possible.  Clearly mark and make sure that you can see:  Any grab bars or handrails.  First and last steps.  Where the edge of each step is.  Use tools that help you move around (mobility aids) if they are needed. These include:  Canes.  Walkers.  Scooters.  Crutches.  Turn on the lights when you go into a dark area. Replace any light bulbs as soon as they burn out.  Set up your furniture so you have a clear path. Avoid moving your furniture around.  If any of your floors are uneven, fix them.  If there are any pets around you, be aware of where they are.  Review your medicines with your doctor. Some medicines can make you feel dizzy. This can increase your chance of falling. Ask your doctor what other things that you can do to help prevent falls. This information is not intended to replace advice given to you by your health care provider. Make sure you discuss any questions you have with your health care provider. Document Released: 01/09/2009 Document Revised: 08/21/2015 Document Reviewed: 04/19/2014 Elsevier Interactive Patient Education  2017 [employer].

## 2020-04-15 ENCOUNTER — Ambulatory Visit: Payer: Medicare Other

## 2020-04-15 ENCOUNTER — Telehealth: Payer: Self-pay | Admitting: *Deleted

## 2020-04-15 DIAGNOSIS — I10 Essential (primary) hypertension: Secondary | ICD-10-CM

## 2020-04-15 DIAGNOSIS — I480 Paroxysmal atrial fibrillation: Secondary | ICD-10-CM

## 2020-04-15 DIAGNOSIS — I5032 Chronic diastolic (congestive) heart failure: Secondary | ICD-10-CM

## 2020-04-15 DIAGNOSIS — E118 Type 2 diabetes mellitus with unspecified complications: Secondary | ICD-10-CM

## 2020-04-15 NOTE — Chronic Care Management (AMB) (Signed)
  Chronic Care Management   Note  04/15/2020 Name: TIYA SCHRUPP MRN: [id number] DOB: 06/28/1950  SOPHI CALLIGAN is a 70 y.o. year old female who is a primary care patient of [name]., MD. AVANNA SOWDER is currently enrolled in care management services. An additional referral for Pharmacy was placed by [facility].   Follow up plan: Telephone appointment with care management team member scheduled for: 05/02/2020 [facility]  Direct Dial [phone]

## 2020-04-22 ENCOUNTER — Other Ambulatory Visit: Payer: HMO

## 2020-04-22 DIAGNOSIS — Z20822 Contact with and (suspected) exposure to covid-19: Secondary | ICD-10-CM

## 2020-04-23 LAB — SARS-COV-2, NAA 2 DAY TAT

## 2020-04-23 LAB — NOVEL CORONAVIRUS, NAA: SARS-CoV-2, NAA: NOT DETECTED

## 2020-04-24 ENCOUNTER — Telehealth: Payer: Self-pay

## 2020-04-24 ENCOUNTER — Ambulatory Visit: Payer: Medicaid Other | Admitting: Family Medicine

## 2020-04-24 NOTE — Progress Notes (Signed)
Chronic Care Management Pharmacy Assistant   Name: Candace [name]  MRN: [id number] DOB: [birth year]  Reason for Encounter: Medication Review/initial question for initial visit with clinical pharmacist,patient assistance for Eliquis.  PCP : [name]., MD  Allergies:   Allergies  Allergen Reactions  . Lisinopril Cough  . Penicillins Swelling, Rash and Other (See Comments)    Did it involve swelling of the face/tongue/throat, SOB, or low BP? Yes Did it involve sudden or severe rash/hives, skin peeling, or any reaction on the inside of your mouth or nose? Yes Did you need to seek medical attention at a hospital or doctor's office? Yes When did it last happen?15 years If all above answers are "NO", may proceed with cephalosporin use.     Medications: Outpatient Encounter Medications as of 04/24/2020  Medication Sig Note  . acetaminophen (TYLENOL) 325 MG tablet Take 650 mg by mouth every 6 (six) hours as needed for moderate pain or headache.    Marland Kitchen atorvastatin (LIPITOR) 40 MG tablet Take 1 tablet (40 mg total) by mouth daily. 04/15/2020: Reports taking 80mg .  . brompheniramine-pseudoephedrine-DM 30-2-10 MG/5ML syrup Take 5 mLs by mouth 3 (three) times daily as needed.   . clobetasol ointment (TEMOVATE) 4.81 % Apply 1 application topically 2 (two) times daily. Apply to affected area twice a day for one (1) week; then daily for one (1) week, then every other day for one (1), then follow up with CNM 04/02/2020: As needed  . ELIQUIS 5 MG TABS tablet Take 5 mg by mouth 2 (two) times daily.   . empagliflozin (JARDIANCE) 10 MG TABS tablet Take 1 tablet (10 mg total) by mouth daily.   . isosorbide mononitrate (IMDUR) 60 MG 24 hr tablet Take 1 tablet (60 mg total) by mouth daily.   Marland Kitchen JARDIANCE 25 MG TABS tablet TAKE 1 TABLET (25 MG TOTAL) BY MOUTH DAILY BEFORE BREAKFAST.   Marland Kitchen Lancets (ONETOUCH DELICA PLUS [id number]) MISC USE UP TO 4 TIMES DAILY AS DIRECTED   . loperamide (IMODIUM)  2 MG capsule Take 2 mg by mouth as needed.   . metoprolol tartrate (LOPRESSOR) 100 MG tablet Take 100 mg by mouth 2 (two) times daily.   . nitroGLYCERIN (NITROSTAT) 0.4 MG SL tablet Place 1 tablet (0.4 mg total) under the tongue every 5 (five) minutes as needed for chest pain.   Marland Kitchen nystatin (NYSTATIN) powder Apply 1 application topically daily.   . ondansetron (ZOFRAN-ODT) 8 MG disintegrating tablet Take 8 mg by mouth 3 (three) times daily.   . pantoprazole (PROTONIX) 20 MG tablet Take 1 tablet (20 mg total) by mouth 2 (two) times daily. (Patient not taking: Reported on 04/02/2020)   . sertraline (ZOLOFT) 50 MG tablet TAKE 1 TABLET BY MOUTH EVERY DAY 04/02/2020: As needed  . sucralfate (CARAFATE) 1 g tablet Take one tablet in the morning and one tablet in the evening. (Patient not taking: Reported on 04/02/2020)   . [DISCONTINUED] cetirizine (ZYRTEC) 5 MG tablet Take 2 tablets (10 mg total) by mouth daily.    No facility-administered encounter medications on file as of 04/24/2020.    Current Diagnosis: Patient Active Problem List   Diagnosis Date Noted  . Vertigo   . Unsteady gait   . Acute bronchitis 03/07/2019  . Esophageal dysphagia   . Stomach irritation   . Gastric polyp   . Esophageal lump   . Hx of colonic polyp   . Polyp of colon   . Diverticulosis of large  intestine without diverticulitis   . PSVT (paroxysmal supraventricular tachycardia) ([place]) 10/03/2018  . Abnormal ECG 07/05/2018  . Tachycardia 03/27/2018  . Pain in limb 02/28/2018  . Chronic diastolic CHF (congestive heart failure) ([place]) 11/03/2017  . TIA (transient ischemic attack) 11/03/2017  . COPD suggested by initial evaluation ([place]) 08/23/2017  . Acute and chronic respiratory failure with hypoxia ([place]) 08/23/2017  . Postoperative state 08/15/2017  . Incidental pulmonary nodule 08/01/2017  . Non-ST elevation myocardial infarction (NSTEMI), subendocardial infarction, subsequent episode of care ([place]) 06/01/2017  . B12  deficiency 12/01/2016  . Vitamin D deficiency 11/04/2016  . Depression with anxiety 09/30/2016  . Paroxysmal A-fib ([place]) 09/30/2016  . Resting tremor 09/30/2016  . Mild obstructive sleep apnea 09/30/2016  . Headache 08/18/2016  . Unstable angina ([place]) 08/03/2016  . Type 2 diabetes mellitus with complication, without long-term current use of insulin ([place]) 08/03/2016  . Chronic daily headache 05/05/2016  . Asthma 11/11/2015  . S/P CABG x 4 11/10/2015  . Coronary artery disease 11/07/2015  . Carotid artery narrowing 02/08/2014  . Chest pain 02/08/2014  . Mixed hyperlipidemia 02/08/2014  . Bilateral carotid artery stenosis 02/08/2014  . Atypical chest pain 02/07/2014  . Essential hypertension 02/07/2014  . Awareness of heartbeats 02/07/2014  . D (diarrhea) 10/05/2013  . Gastroesophageal reflux disease 10/05/2013    Goals Addressed   None     Have you seen any other providers since your last visit? no Any changes in your medications or health? no Any side effects from any medications? no Do you have an symptoms or problems not managed by your medications? no Any concerns about your health right now? no Has your provider asked that you check blood pressure, blood sugar, or follow special diet at home? No  Patient states she unsure how to work her blood pressure machine.  Patient reports she cooks at home with plenty of vegetables and protein. Do you get any type of exercise on a regular basis? no Can you think of a goal you would like to reach for your health? N/A Do you have any problems getting your medications? Yes  Patient states she is having a hard time affording Eliquis, $45.00 for 30 pills. Is there anything that you would like to discuss during the appointment?   Patient assistance for Eliquis.  Please bring medications and supplements to appointment  Leave a voice message to inform patient  we are sending her a Patient assistance form  for Eliquis  by mail.Informed  patient to include a copy of her proof of income AND a copy of her Explanation of Benefits (EOB) statement from her insurance.Advised patient to return the PAP forms back to the [place] family office.  Follow-Up:  Patient Assistance Coordination and Pharmacist Review   [place] Pharmacist Assistant [phone]

## 2020-04-30 ENCOUNTER — Ambulatory Visit: Payer: Self-pay | Admitting: Family Medicine

## 2020-04-30 NOTE — Telephone Encounter (Signed)
Pt reports episode "Throat closed up, couldn't swallow, like spasms.". States last 6-7 minutes.. States was able to breath, "Just couldn't;t swallow and then I vomited." Pt had similar episodes year ago, saw endo,'Intermittent dysphagia' states they "Stretched an area of my throat I think."  Agent had made appt for tomorrow at 940. Advised ED if symptoms occur, worsen, SOB. Pt verbalizes understanding.  Reason for Disposition . [1] Swallowing difficulty AND [2] cause unknown (Exception: difficulty swallowing is a chronic symptom)  Answer Assessment - Initial Assessment Questions 1. SYMPTOM: "Are you having difficulty swallowing liquids, solids, or both?"    ONe episode, "THroat closed, spasms." 2. ONSET: "When did the swallowing problems begin?"      Had episodes 1 year ago.  3. CAUSE: "What do you think is causing the problem?"      "A narrowing in my throat." 4. CHRONIC/RECURRENT: "Is this a new problem for you?"  If no, ask: "How long have you had this problem?" (e.g., days, weeks, months)      NO, one last year 5. OTHER SYMPTOMS: "Do you have any other symptoms?" (e.g., difficulty breathing, sore throat, swollen tongue, chest pain)     PAin at top of chest 6. PREGNANCY: "Is there any chance you are pregnant?" "When was your last menstrual period?"     NA  Protocols used: SWALLOWING DIFFICULTY-A-AH

## 2020-05-01 ENCOUNTER — Encounter: Payer: Self-pay | Admitting: Family Medicine

## 2020-05-01 ENCOUNTER — Ambulatory Visit (INDEPENDENT_AMBULATORY_CARE_PROVIDER_SITE_OTHER): Payer: HMO | Admitting: Family Medicine

## 2020-05-01 ENCOUNTER — Other Ambulatory Visit: Payer: Self-pay

## 2020-05-01 VITALS — BP 121/71 | HR 90 | Temp 98.0°F | Resp 16 | Wt 169.0 lb

## 2020-05-01 DIAGNOSIS — I480 Paroxysmal atrial fibrillation: Secondary | ICD-10-CM

## 2020-05-01 DIAGNOSIS — R1319 Other dysphagia: Secondary | ICD-10-CM

## 2020-05-01 DIAGNOSIS — Z951 Presence of aortocoronary bypass graft: Secondary | ICD-10-CM

## 2020-05-01 DIAGNOSIS — E118 Type 2 diabetes mellitus with unspecified complications: Secondary | ICD-10-CM

## 2020-05-01 DIAGNOSIS — I25708 Atherosclerosis of coronary artery bypass graft(s), unspecified, with other forms of angina pectoris: Secondary | ICD-10-CM

## 2020-05-01 DIAGNOSIS — K219 Gastro-esophageal reflux disease without esophagitis: Secondary | ICD-10-CM

## 2020-05-01 LAB — POCT GLYCOSYLATED HEMOGLOBIN (HGB A1C): Hemoglobin A1C: 12.2 % — AB (ref 4.0–5.6)

## 2020-05-01 MED ORDER — TRULICITY 0.75 MG/0.5ML ~~LOC~~ SOAJ: 0.7500 mg | SUBCUTANEOUS | 0 refills | Status: DC

## 2020-05-01 MED ORDER — SUCRALFATE 1 G PO TABS: ORAL_TABLET | ORAL | 12 refills | Status: DC

## 2020-05-01 NOTE — Patient Instructions (Signed)
Start Trulicity injection once weekly.

## 2020-05-01 NOTE — Progress Notes (Signed)
Established patient visit   Patient: Candace [name]   DOB: 1950/07/04   70 y.o. Female  MRN: [id number] Visit Date: 05/01/2020  Today's healthcare provider: [name], MD   Chief Complaint  Patient presents with  . Dysphagia   Subjective    HPI  Patient is here concerning an episode Candace [name] had yesterday were her throat "closed up". Patient states Candace [name] couldn't swallow during episode. Candace [name] sates episode lasted 6-7 minutes. Patient states Candace [name] was able to breath, but Candace [name] was unable to swallow and then Candace [name] vomited. Patient had similar episodes a year ago. Patient saw Gastroenterology for intermittent dysphagia.  Regarding her diabetes Candace [name] felt nauseated with Ozempic but would like to try other medications.  Candace [name] is on Jardiance at 10 mg daily. Diabetes Mellitus Type II, Follow-up  Lab Results  Component Value Date   HGBA1C 12.2 (A) 05/01/2020   HGBA1C 12.4 (A) 12/06/2019   HGBA1C 9.4 (A) 09/04/2019   Wt Readings from Last 3 Encounters:  05/01/20 169 lb (76.7 kg)  02/14/20 167 lb 3.2 oz (75.8 kg)  02/06/20 165 lb (74.8 kg)   Last seen for diabetes 2 months ago.  Management since then includes starting Jardiance 25mg  daily. Candace [name] reports good compliance with treatment. Candace [name] is not having side effects.  Symptoms: Yes fatigue No foot ulcerations  No appetite changes No nausea  No paresthesia of the feet  No polydipsia  No polyuria No visual disturbances   No vomiting     Home blood sugar records: fasting range: 180s  Episodes of hypoglycemia? No    Current insulin regiment: none Most Recent Eye Exam: up to date Current exercise: no regular exercise Current diet habits: well balanced  Pertinent Labs: Lab Results  Component Value Date   CHOL 234 (H) 08/31/2019   HDL 49 08/31/2019   LDLCALC 163 (H) 08/31/2019   TRIG 110 08/31/2019   CHOLHDL 4.8 08/31/2019   Lab Results  Component Value Date   NA 142 02/06/2020   K 3.8 02/06/2020   CREATININE 0.60 02/06/2020    GFRNONAA 94 02/06/2020   GFRAA 108 02/06/2020   GLUCOSE 141 (H) 02/06/2020          Medications: Outpatient Medications Prior to Visit  Medication Sig  . acetaminophen (TYLENOL) 325 MG tablet Take 650 mg by mouth every 6 (six) hours as needed for moderate pain or headache.   Marland Kitchen atorvastatin (LIPITOR) 40 MG tablet Take 1 tablet (40 mg total) by mouth daily.  . clobetasol ointment (TEMOVATE) [id number] % Apply 1 application topically 2 (two) times daily. Apply to affected area twice a day for one (1) week; then daily for one (1) week, then every other day for one (1), then follow up with CNM  . ELIQUIS 5 MG TABS tablet Take 5 mg by mouth 2 (two) times daily.  . empagliflozin (JARDIANCE) 10 MG TABS tablet Take 1 tablet (10 mg total) by mouth daily.  . isosorbide mononitrate (IMDUR) 60 MG 24 hr tablet Take 1 tablet (60 mg total) by mouth daily.  Marland Kitchen JARDIANCE 25 MG TABS tablet TAKE 1 TABLET (25 MG TOTAL) BY MOUTH DAILY BEFORE BREAKFAST.  Marland Kitchen loperamide (IMODIUM) 2 MG capsule Take 2 mg by mouth as needed.  . metoprolol tartrate (LOPRESSOR) 100 MG tablet Take 100 mg by mouth 2 (two) times daily.  . nitroGLYCERIN (NITROSTAT) 0.4 MG SL tablet Place 1 tablet (0.4 mg total) under the tongue every 5 (five) minutes as needed for  chest pain.  Marland Kitchen nystatin (NYSTATIN) powder Apply 1 application topically daily.  . ondansetron (ZOFRAN-ODT) 8 MG disintegrating tablet Take 8 mg by mouth 3 (three) times daily.  . pantoprazole (PROTONIX) 20 MG tablet Take 1 tablet (20 mg total) by mouth 2 (two) times daily.  . sertraline (ZOLOFT) 50 MG tablet TAKE 1 TABLET BY MOUTH EVERY DAY  . brompheniramine-pseudoephedrine-DM 30-2-10 MG/5ML syrup Take 5 mLs by mouth 3 (three) times daily as needed. (Patient not taking: Reported on 05/01/2020)  . Lancets (ONETOUCH DELICA PLUS [id number]) MISC USE UP TO 4 TIMES DAILY AS DIRECTED  . sucralfate (CARAFATE) 1 g tablet Take one tablet in the morning and one tablet in the evening. (Patient not  taking: No sig reported)   No facility-administered medications prior to visit.    Review of Systems  Constitutional: Negative for appetite change, chills, fatigue and fever.  Respiratory: Negative for chest tightness and shortness of breath.   Cardiovascular: Negative for chest pain and palpitations.  Gastrointestinal: Negative for abdominal pain, nausea and vomiting.  Genitourinary: Negative for hematuria.  Neurological: Negative for dizziness and weakness.        Objective    BP 121/71   Pulse 90   Temp 98 F (36.7 C)   Resp 16   Wt 169 lb (76.7 kg)   BMI 30.91 kg/m  BP Readings from Last 3 Encounters:  05/01/20 121/71  02/14/20 122/74  02/06/20 (!) 147/78   Wt Readings from Last 3 Encounters:  05/01/20 169 lb (76.7 kg)  02/14/20 167 lb 3.2 oz (75.8 kg)  02/06/20 165 lb (74.8 kg)       Physical Exam Vitals reviewed.  Constitutional:      Appearance: Normal appearance.  HENT:     Head: Normocephalic and atraumatic.     Right Ear: External ear normal.     Left Ear: External ear normal.  Eyes:     General: No scleral icterus.    Conjunctiva/sclera: Conjunctivae normal.  Cardiovascular:     Rate and Rhythm: Normal rate and regular rhythm.     Pulses: Normal pulses.     Heart sounds: Normal heart sounds.  Pulmonary:     Effort: Pulmonary effort is normal.     Breath sounds: Normal breath sounds.  Musculoskeletal:     Right lower leg: No edema.     Left lower leg: No edema.  Skin:    General: Skin is warm and dry.  Neurological:     General: No focal deficit present.     Mental Status: Candace [name] is alert and oriented to person, place, and time.  Psychiatric:        Mood and Affect: Mood normal.        Behavior: Behavior normal.        Thought Content: Thought content normal.        Judgment: Judgment normal.       Results for orders placed or performed in visit on 05/01/20  POCT glycosylated hemoglobin (Hb A1C)  Result Value Ref Range   Hemoglobin A1C  12.2 (A) 4.0 - 5.6 %   HbA1c POC (<> result, manual entry)     HbA1c, POC (prediabetic range)     HbA1c, POC (controlled diabetic range)      Assessment & Plan     1. Esophageal dysphagia Omeprazole every morning  2. Gastroesophageal reflux disease, unspecified whether esophagitis present Add Carafate. - sucralfate (CARAFATE) 1 g tablet; Take one tablet in the morning and one  tablet in the evening.  Dispense: 120 tablet; Refill: 12  3. Type 2 diabetes mellitus with complication, without long-term current use of insulin (HCC) A1c is orally controlled at 12.2 At this time increase Jardiance to 25 mg daily and try Trulicity 8.18 and follow-up in 2 weeks. - POCT glycosylated hemoglobin (Hb A1C) - Dulaglutide (TRULICITY) 2.99 BZ/1.6RC SOPN; Inject 0.75 mg into the skin once a week.  Dispense: 1 mL; Refill: 0  4. Paroxysmal A-fib ([place])   5. Coronary artery disease of bypass graft of native heart with stable angina pectoris ([place])   6. S/P CABG x 4   No follow-ups on file.      I, [name], MD, have reviewed all documentation for this visit. The documentation on 05/09/20 for the exam, diagnosis, procedures, and orders are all accurate and complete.    Tyan Lasure Cranford Mon, MD  [facility] [phone] (phone) [phone] (fax)  [place]

## 2020-05-02 ENCOUNTER — Telehealth: Payer: HMO

## 2020-05-02 NOTE — Progress Notes (Deleted)
Chronic Care Management Pharmacy Note  05/02/2020 Name:  Candace [name] MRN:  [id number] DOB:  1950-11-21  Subjective: Candace [name] is an 70 y.o. year old female who is a primary patient of [name]., MD.  The CCM team was consulted for assistance with disease management and care coordination needs.    Engaged with patient by telephone for initial visit in response to provider referral for pharmacy case management and/or care coordination services.   Consent to Services:  The patient was given the following information about Chronic Care Management services today, agreed to services, and gave verbal consent: 1. CCM service includes personalized support from designated clinical staff supervised by the primary care provider, including individualized plan of care and coordination with other care providers 2. 24/7 contact phone numbers for assistance for urgent and routine care needs. 3. Service will only be billed when office clinical staff spend 20 minutes or more in a month to coordinate care. 4. Only one practitioner may furnish and bill the service in a calendar month. 5.The patient may stop CCM services at any time (effective at the end of the month) by phone call to the office staff. 6. The patient will be responsible for cost sharing (co-pay) of up to 20% of the service fee (after annual deductible is met). Patient agreed to services and consent obtained.  Patient Care Team: [name]., MD as PCP - General (Family Medicine) Corey Skains, MD as Consulting Physician (Internal Medicine) Sharlotte Alamo, DPM (Podiatry) Neldon Labella, RN as Case Manager Virgel Manifold, MD as Consulting Physician (Gastroenterology) [place], [place]  Recent office visits: 05/01/20: Patient presented to Dr. Rosanna Randy for follow-up. A1c 12.2%. Patient started on Trulicity 4.09 mg weekly.  02/06/20: Patient presented to Dr. Rosanna Randy for follow-up.   Recent consult  visits: 04/23/20: Patient presented to Sharlotte Alamo, DPM for diabetic foot care.  02/28/20: Patient presented to Dr. Nehemiah Massed (Cardiology) for follow-up. Patient started on Eliquis 5 mg twice daily   Objective:  Lab Results  Component Value Date   CREATININE 0.60 02/06/2020   BUN 10 02/06/2020   GFRNONAA 94 02/06/2020   GFRAA 108 02/06/2020   NA 142 02/06/2020   K 3.8 02/06/2020   CALCIUM 9.4 02/06/2020   CO2 19 (L) 02/06/2020    Lab Results  Component Value Date/Time   HGBA1C 12.2 (A) 05/01/2020 09:55 AM   HGBA1C 12.4 (A) 12/06/2019 11:35 AM   HGBA1C 9.8 (H) 08/30/2019 07:15 AM   HGBA1C 9.2 07/10/2019 12:00 AM   HGBA1C 9.5 (H) 03/06/2019 07:25 PM   HGBA1C 8.3 (H) 07/14/2013 05:42 AM    Last diabetic Eye exam:  Lab Results  Component Value Date/Time   HMDIABEYEEXA No Retinopathy 06/28/2019 12:00 AM    Last diabetic Foot exam: No results found for: HMDIABFOOTEX   Lab Results  Component Value Date   CHOL 234 (H) 08/31/2019   HDL 49 08/31/2019   LDLCALC 163 (H) 08/31/2019   TRIG 110 08/31/2019   CHOLHDL 4.8 08/31/2019    Hepatic Function Latest Ref Rng & Units 02/06/2020 11/21/2019 08/29/2019  Total Protein 6.0 - 8.5 g/dL 6.9 - 7.0  Albumin 3.8 - 4.8 g/dL 4.4 - 4.2  AST 0 - 40 IU/L 18 - 21  ALT 0 - 32 IU/L 13 - 15  Alk Phosphatase 44 - 121 IU/L 135(H) 139(H) 98  Total Bilirubin 0.0 - 1.2 mg/dL 0.2 - 0.6  Bilirubin, Direct 0.0 - 0.2 mg/dL - - <  0.1    Lab Results  Component Value Date/Time   TSH 1.180 01/03/2019 09:57 AM   TSH 1.262 09/14/2018 05:04 PM   TSH 1.661 12/23/2016 07:03 PM   TSH 0.818 10/01/2016 09:35 AM    CBC Latest Ref Rng & Units 08/31/2019 08/29/2019 06/10/2019  WBC 4.0 - 10.5 K/uL 6.8 7.3 5.5  Hemoglobin 12.0 - 15.0 g/dL 12.4 13.9 14.2  Hematocrit 36.0 - 46.0 % 37.7 40.5 41.4  Platelets 150 - 400 K/uL 190 229 212    Lab Results  Component Value Date/Time   VD25OH 12.2 (L) 10/01/2016 09:35 AM    Clinical ASCVD: Yes  The ASCVD Risk score Mikey Bussing  DC Jr., et al., 2013) failed to calculate for the following reasons:   The patient has a prior MI or stroke diagnosis    Depression screen [facility] 2/9 04/15/2020 04/02/2020 02/06/2020  Decreased Interest 0 0 1  Down, Depressed, Hopeless 0 0 0  PHQ - 2 Score 0 0 1  Altered sleeping - - 1  Tired, decreased energy - - 1  Change in appetite - - 1  Feeling bad or failure about yourself  - - 0  Trouble concentrating - - 0  Moving slowly or fidgety/restless - - 0  Suicidal thoughts - - 0  PHQ-9 Score - - 4  Difficult doing work/chores - - Not difficult at all  Some recent data might be hidden     ***Other:  CHA2DS2-VASc: 8   (CHADS2VASc if Afib, MMRC or CAT for COPD, ACT, DEXA)  Social History   Tobacco Use  Smoking Status Former Smoker  . Packs/day: 0.50  . Types: Cigarettes  . Quit date: 10/07/2001  . Years since quitting: 18.5  Smokeless Tobacco Never Used   BP Readings from Last 3 Encounters:  05/01/20 121/71  02/14/20 122/74  02/06/20 (!) 147/78   Pulse Readings from Last 3 Encounters:  05/01/20 90  02/14/20 80  02/06/20 84   Wt Readings from Last 3 Encounters:  05/01/20 169 lb (76.7 kg)  02/14/20 167 lb 3.2 oz (75.8 kg)  02/06/20 165 lb (74.8 kg)    Assessment/Interventions: Review of patient past medical history, allergies, medications, health status, including review of consultants reports, laboratory and other test data, was performed as part of comprehensive evaluation and provision of chronic care management services.   SDOH:  (Social Determinants of Health) assessments and interventions performed:   CCM Care Plan  Allergies  Allergen Reactions  . Lisinopril Cough  . Penicillins Swelling, Rash and Other (See Comments)    Did it involve swelling of the face/tongue/throat, SOB, or low BP? Yes Did it involve sudden or severe rash/hives, skin peeling, or any reaction on the inside of your mouth or nose? Yes Did you need to seek medical attention at a hospital or  doctor's office? Yes When did it last happen?15 years If all above answers are "NO", may proceed with cephalosporin use.     Medications Reviewed Today    Reviewed by Wilder Glade, CMA (Certified Medical Assistant) on 05/01/20 at [phone]  Med List Status: <None>  Medication Order Taking? Sig Documenting Provider Last Dose Status Informant  acetaminophen (TYLENOL) 325 MG tablet [id number] Yes Take 650 mg by mouth every 6 (six) hours as needed for moderate pain or headache.  [provider] Taking Active Self  atorvastatin (LIPITOR) 40 MG tablet [id number] Yes Take 1 tablet (40 mg total) by mouth daily. Fritzi Mandes, MD Taking Active  Med Note Minerva Ends, FELECIA N   Tue Apr 15, 2020  9:06 AM) Reports taking 72m.  brompheniramine-pseudoephedrine-DM 30-2-10 MG/5ML syrup [id number] Take 5 mLs by mouth 3 (three) times daily as needed.  Patient not taking: Reported on 05/01/2020   [provider] Not Taking Active         Discontinued 03/06/19 1830   clobetasol ointment (TEMOVATE) 0.05 % [id number] Apply 1 application topically 2 (two) times daily. Apply to affected area twice a day for one (1) week; then daily for one (1) week, then every other day for one (1), then follow up with CNM [place], JLara Mulch CNM Taking Active            Med Note (Hassan Buckler MCKENZIE A   Wed Apr 02, 2020  3:27 PM) As needed  ELIQUIS 5 MG TABS tablet [id number] Take 5 mg by mouth 2 (two) times daily. [provider] Taking Active   empagliflozin (JARDIANCE) 10 MG TABS tablet [id number] Take 1 tablet (10 mg total) by mouth daily. GJerrol Banana, MD Taking Active   isosorbide mononitrate (IMDUR) 60 MG 24 hr tablet [id number] Take 1 tablet (60 mg total) by mouth daily. WLoletha Grayer MD Taking Active Self  JARDIANCE 25 MG TABS tablet [id number] TAKE 1 TABLET (25 MG TOTAL) BY MOUTH DAILY BEFORE BREAKFAST. GJerrol Banana, MD Taking Active    Lancets (ONETOUCH DELICA PLUS [id number] USE UP TO 4 TIMES DAILY AS DIRECTED GJerrol Banana, MD  Active Self  loperamide (IMODIUM) 2 MG capsule [id number] Take 2 mg by mouth as needed. [provider] Taking Active   metoprolol tartrate (LOPRESSOR) 100 MG tablet [id number] Take 100 mg by mouth 2 (two) times daily. [provider] Taking Active Self  nitroGLYCERIN (NITROSTAT) 0.4 MG SL tablet [id number] Place 1 tablet (0.4 mg total) under the tongue every 5 (five) minutes as needed for chest pain. MBettey Costa MD Taking Active Self           Med Note (Johny DrillingDec 30, 2019  2:39 PM)    nystatin (NYSTATIN) powder [id number] Apply 1 application topically daily. CRubie Maid MD Taking Active   ondansetron (ZOFRAN-ODT) 8 MG disintegrating tablet [id number] Take 8 mg by mouth 3 (three) times daily. [provider] Taking Active   pantoprazole (PROTONIX) 20 MG tablet [id number] Take 1 tablet (20 mg total) by mouth 2 (two) times daily. GJerrol Banana, MD Taking Active   sertraline (ZOLOFT) 50 MG tablet [id number] TAKE 1 TABLET BY MOUTH EVERY DAY GJerrol Banana, MD Taking Active            Med Note ([facility]   Wed Apr 02, 2020  3:28 PM) As needed  sucralfate (CARAFATE) 1 g tablet [id number] Take one tablet in the morning and one tablet in the evening.  Patient not taking: No sig reported   GJerrol Banana, MD Not Taking Active           Patient Active Problem List   Diagnosis Date Noted  . Vertigo   . Unsteady gait   . Acute bronchitis 03/07/2019  . Esophageal dysphagia   . Stomach irritation   . Gastric polyp   . Esophageal lump   . Hx of colonic polyp   . Polyp of colon   . Diverticulosis of large intestine without diverticulitis   .  PSVT (paroxysmal supraventricular tachycardia) ([place]) 10/03/2018  . Abnormal ECG 07/05/2018  . Tachycardia 03/27/2018  . Pain in limb  02/28/2018  . Chronic diastolic CHF (congestive heart failure) ([place]) 11/03/2017  . TIA (transient ischemic attack) 11/03/2017  . COPD suggested by initial evaluation ([place]) 08/23/2017  . Acute and chronic respiratory failure with hypoxia ([place]) 08/23/2017  . Postoperative state 08/15/2017  . Incidental pulmonary nodule 08/01/2017  . Non-ST elevation myocardial infarction (NSTEMI), subendocardial infarction, subsequent episode of care ([place]) 06/01/2017  . B12 deficiency 12/01/2016  . Vitamin D deficiency 11/04/2016  . Depression with anxiety 09/30/2016  . Paroxysmal A-fib ([place]) 09/30/2016  . Resting tremor 09/30/2016  . Mild obstructive sleep apnea 09/30/2016  . Headache 08/18/2016  . Unstable angina ([place]) 08/03/2016  . Type 2 diabetes mellitus with complication, without long-term current use of insulin ([place]) 08/03/2016  . Chronic daily headache 05/05/2016  . Asthma 11/11/2015  . S/P CABG x 4 11/10/2015  . Coronary artery disease 11/07/2015  . Carotid artery narrowing 02/08/2014  . Chest pain 02/08/2014  . Mixed hyperlipidemia 02/08/2014  . Bilateral carotid artery stenosis 02/08/2014  . Atypical chest pain 02/07/2014  . Essential hypertension 02/07/2014  . Awareness of heartbeats 02/07/2014  . D (diarrhea) 10/05/2013  . Gastroesophageal reflux disease 10/05/2013    Immunization History  Administered Date(s) Administered  . Fluad Quad(high Dose 65+) 12/28/2018  . Influenza, High Dose Seasonal PF 12/24/2016, 12/08/2017  . Influenza,inj,Quad PF,6+ Mos 12/19/2015, 01/18/2020  . Influenza-Unspecified 03/06/2015  . Moderna Sars-Covid-2 Vaccination 04/14/2019, 05/12/2019, 01/28/2020  . Pneumococcal Conjugate-13 03/16/2018  . Pneumococcal Polysaccharide-23 12/24/2016  . Tdap 07/28/2010    Conditions to be addressed/monitored:  Atrial Fibrillation, CHF, CAD, HTN, HLD, DMII and Pulmonary Disease  There are no care plans that you recently modified to display for this patient.     Medication Assistance: {MEDASSISTANCEINFO:25044}  Patient's preferred pharmacy is:  [id number] [place] [place] [address] [id number]: [phone]: [phone] Uses pill box? {Yes or If no, why not?:20788} Pt endorses ***% compliance  We discussed: {Pharmacy options:24294} Patient decided to: {US Pharmacy PVQQV:95638} Follow Up:  {FOLLOWUP:24991}  Plan: {CM FOLLOW UP PLAN:25073}  ***   Current Barriers:  . {pharmacybarriers:24917} . ***  Pharmacist Clinical Goal(s):  .Marland KitchenOver the next *** days, patient will {PHARMACYGOALCHOICES:24921} through collaboration with PharmD and provider.  . ***  Interventions: . 1:1 collaboration with GJerrol Banana, MD regarding development and update of comprehensive plan of care as evidenced by provider attestation and co-signature . Inter-disciplinary care team collaboration (see longitudinal plan of care) . Comprehensive medication review performed; medication list updated in electronic medical record  Hypertension (BP goal {CHL HP UPSTREAM Pharmacist BP ranges:[phone]}) -{CHL Controlled/Uncontrolled:[phone]} -Current treatment: . *** -Medications previously tried: ***  -Current home readings: *** -Current dietary habits: *** -Current exercise habits: *** -{ACTIONS;DENIES/REPORTS:21021675::"Denies"} hypotensive/hypertensive symptoms -Educated on {CCM BP Counseling:25124} -Counseled to monitor BP at home ***, document, and provide log at future appointments -{CCMPHARMDINTERVENTION:25122}  Hyperlipidemia: (LDL goal < ***) -{CHL Controlled/Uncontrolled:[phone]} -Current treatment: . *** -Medications previously tried: ***  -Current dietary patterns: *** -Current exercise habits: *** -Educated on {CCM HLD Counseling:25126} -{CCMPHARMDINTERVENTION:25122}  Diabetes (A1c goal {A1c goals:23924}) -{CHL Controlled/Uncontrolled:[phone]} -Current  medications: . *** -Medications previously tried: ***  -Current home glucose readings . fasting glucose: *** . post prandial glucose: *** -{ACTIONS;DENIES/REPORTS:21021675::"Denies"} hypoglycemic/hyperglycemic symptoms -Current meal patterns:  . breakfast: ***  . lunch: ***  . dinner: *** .  snacks: *** . drinks: *** -Current exercise: *** -Educated on{CCM DM COUNSELING:25123} -Counseled to check feet daily and get yearly eye exams -{CCMPHARMDINTERVENTION:25122}   Patient Goals/Self-Care Activities . Over the next *** days, patient will:  - {pharmacypatientgoals:24919}  Follow Up Plan: {CM FOLLOW UP GWLT:02301}

## 2020-05-12 NOTE — Progress Notes (Signed)
Established patient visit   Patient: Candace [name]   DOB: 02/06/1951   70 y.o. Female  MRN: [id number] Visit Date: 05/13/2020  Today's healthcare provider: [name], MD   Chief Complaint  Patient presents with  . Diabetes   Subjective    HPI  Blood sugars have definitely been trending down on Trulicity.  She tolerates it well. They have been between 150 and 200 for the most part since starting that.  Blood sugars are much better. She has had a little cough that she has had since she had a cold a couple weeks ago. No other symptoms and no exposure to Covid.  No PND orthopnea.  No chest pain.  Diabetes Mellitus Type II, follow-up  Lab Results  Component Value Date   HGBA1C 12.2 (A) 05/01/2020   HGBA1C 12.4 (A) 12/06/2019   HGBA1C 9.4 (A) 09/04/2019   Last seen for diabetes 2 weeks ago.  Management since then includes; At this time increase Jardiance to 25 mg daily and try Trulicity 2.02 and follow-up in 2 weeks. She reports good compliance with treatment. She is not having side effects.   Home blood sugar records: trend: fluctuating a bit  Episodes of hypoglycemia? No    Current insulin regiment: trulicity  Most Recent Eye Exam: due  Esophageal dysphagia From 05/01/2020-Omeprazole every morning.  Gastroesophageal reflux disease, unspecified whether esophagitis present From 05/01/2020-Added Carafate..       Medications: Outpatient Medications Prior to Visit  Medication Sig  . acetaminophen (TYLENOL) 325 MG tablet Take 650 mg by mouth every 6 (six) hours as needed for moderate pain or headache.   Marland Kitchen atorvastatin (LIPITOR) 40 MG tablet Take 1 tablet (40 mg total) by mouth daily.  . brompheniramine-pseudoephedrine-DM 30-2-10 MG/5ML syrup Take 5 mLs by mouth 3 (three) times daily as needed. (Patient not taking: Reported on 05/01/2020)  . clobetasol ointment (TEMOVATE) 5.42 % Apply 1 application topically 2 (two) times daily. Apply to affected area twice  a day for one (1) week; then daily for one (1) week, then every other day for one (1), then follow up with CNM  . Dulaglutide (TRULICITY) 7.06 CB/7.6EG SOPN Inject 0.75 mg into the skin once a week.  Marland Kitchen ELIQUIS 5 MG TABS tablet Take 5 mg by mouth 2 (two) times daily.  . empagliflozin (JARDIANCE) 10 MG TABS tablet Take 1 tablet (10 mg total) by mouth daily. (Patient not taking: Reported on 05/01/2020)  . isosorbide mononitrate (IMDUR) 60 MG 24 hr tablet Take 1 tablet (60 mg total) by mouth daily.  Marland Kitchen JARDIANCE 25 MG TABS tablet TAKE 1 TABLET (25 MG TOTAL) BY MOUTH DAILY BEFORE BREAKFAST.  Marland Kitchen Lancets (ONETOUCH DELICA PLUS [id number]) MISC USE UP TO 4 TIMES DAILY AS DIRECTED  . loperamide (IMODIUM) 2 MG capsule Take 2 mg by mouth as needed.  . metoprolol tartrate (LOPRESSOR) 100 MG tablet Take 100 mg by mouth 2 (two) times daily.  . nitroGLYCERIN (NITROSTAT) 0.4 MG SL tablet Place 1 tablet (0.4 mg total) under the tongue every 5 (five) minutes as needed for chest pain.  Marland Kitchen nystatin (NYSTATIN) powder Apply 1 application topically daily.  . ondansetron (ZOFRAN-ODT) 8 MG disintegrating tablet Take 8 mg by mouth 3 (three) times daily.  . pantoprazole (PROTONIX) 20 MG tablet Take 1 tablet (20 mg total) by mouth 2 (two) times daily.  . sertraline (ZOLOFT) 50 MG tablet TAKE 1 TABLET BY MOUTH EVERY DAY  . sucralfate (CARAFATE) 1 g tablet  Take one tablet in the morning and one tablet in the evening.   No facility-administered medications prior to visit.    Review of Systems  Constitutional: Negative for appetite change, chills, fatigue and fever.  Respiratory: Negative for chest tightness and shortness of breath.   Cardiovascular: Negative for chest pain and palpitations.  Gastrointestinal: Negative for abdominal pain, nausea and vomiting.  Neurological: Negative for dizziness and weakness.        Objective    BP (!) 166/95   Pulse 86   Temp 98.6 F (37 C)   Resp 16   Wt 169 lb (76.7 kg)   BMI 30.91  kg/m  BP Readings from Last 3 Encounters:  05/13/20 (!) 166/95  05/01/20 121/71  02/14/20 122/74   Wt Readings from Last 3 Encounters:  05/13/20 169 lb (76.7 kg)  05/01/20 169 lb (76.7 kg)  02/14/20 167 lb 3.2 oz (75.8 kg)       Physical Exam Vitals reviewed.  Constitutional:      Appearance: Normal appearance.  HENT:     Head: Normocephalic and atraumatic.     Right Ear: External ear normal.     Left Ear: External ear normal.  Eyes:     General: No scleral icterus.    Conjunctiva/sclera: Conjunctivae normal.  Cardiovascular:     Rate and Rhythm: Normal rate and regular rhythm.     Pulses: Normal pulses.     Heart sounds: Normal heart sounds.  Pulmonary:     Effort: Pulmonary effort is normal.     Breath sounds: Normal breath sounds.  Musculoskeletal:     Right lower leg: No edema.     Left lower leg: No edema.  Skin:    General: Skin is warm and dry.  Neurological:     General: No focal deficit present.     Mental Status: She is alert and oriented to person, place, and time.  Psychiatric:        Mood and Affect: Mood normal.        Behavior: Behavior normal.        Thought Content: Thought content normal.        Judgment: Judgment normal.       No results found for any visits on 05/13/20.  Assessment & Plan     1. Type 2 diabetes mellitus with complication, without long-term current use of insulin (HCC) Blood sugar much better on Trulicity.  Continue follow-up for 3 months A1c  2. Viral URI I do not think this is Covid.  3. Cough Try Tessalon Perles. 4.  Status post CABG All risk factors treated.  No follow-ups on file.      I, [name], MD, have reviewed all documentation for this visit. The documentation on 05/17/20 for the exam, diagnosis, procedures, and orders are all accurate and complete.    Ameera Tigue Cranford Mon, MD  [facility] [phone] (phone) [phone] (fax)  [place]

## 2020-05-13 ENCOUNTER — Other Ambulatory Visit: Payer: Self-pay

## 2020-05-13 ENCOUNTER — Ambulatory Visit (INDEPENDENT_AMBULATORY_CARE_PROVIDER_SITE_OTHER): Payer: HMO | Admitting: Family Medicine

## 2020-05-13 VITALS — BP 166/95 | HR 86 | Temp 98.6°F | Resp 16 | Wt 169.0 lb

## 2020-05-13 DIAGNOSIS — Z951 Presence of aortocoronary bypass graft: Secondary | ICD-10-CM

## 2020-05-13 DIAGNOSIS — J069 Acute upper respiratory infection, unspecified: Secondary | ICD-10-CM

## 2020-05-13 DIAGNOSIS — R059 Cough, unspecified: Secondary | ICD-10-CM

## 2020-05-13 DIAGNOSIS — E118 Type 2 diabetes mellitus with unspecified complications: Secondary | ICD-10-CM | POA: Diagnosis not present

## 2020-05-13 MED ORDER — TRULICITY 0.75 MG/0.5ML ~~LOC~~ SOAJ: 0.7500 mg | SUBCUTANEOUS | 3 refills | Status: DC

## 2020-05-13 MED ORDER — BENZONATATE 100 MG PO CAPS: 100.0000 mg | ORAL_CAPSULE | Freq: Three times a day (TID) | ORAL | 1 refills | Status: DC | PRN

## 2020-05-15 ENCOUNTER — Other Ambulatory Visit: Payer: Self-pay | Admitting: Family Medicine

## 2020-05-15 DIAGNOSIS — E118 Type 2 diabetes mellitus with unspecified complications: Secondary | ICD-10-CM

## 2020-05-15 MED ORDER — TRULICITY 0.75 MG/0.5ML ~~LOC~~ SOAJ: 0.7500 mg | SUBCUTANEOUS | 3 refills | Status: DC

## 2020-05-15 NOTE — Telephone Encounter (Signed)
Copied from [place] [id number]. Topic: Quick Communication - Rx Refill/Question >> May 15, 2020 10:00 AM Tessa Lerner A wrote: Medication: Dulaglutide (TRULICITY) 5.11 MY/1.1ZN SOPN   Has the patient contacted their pharmacy? Yes. Patient was directed to contact PCP  Preferred Pharmacy (with phone number or street name): [id number] [place], [place] - [address]  Phone:  [phone]  Agent: Please be advised that RX refills may take up to 3 business days. We ask that you follow-up with your pharmacy.

## 2020-05-19 ENCOUNTER — Telehealth: Payer: Self-pay | Admitting: Family Medicine

## 2020-05-19 MED ORDER — TRULICITY 0.75 MG/0.5ML ~~LOC~~ SOAJ: 0.7500 mg | SUBCUTANEOUS | 3 refills | Status: DC

## 2020-05-19 NOTE — Addendum Note (Signed)
Addended by: Matilde Sprang on: 05/19/2020 11:05 AM   Modules accepted: Orders

## 2020-05-19 NOTE — Addendum Note (Signed)
Addended by: Matilde Sprang on: 05/19/2020 10:59 AM   Modules accepted: Orders

## 2020-05-19 NOTE — Telephone Encounter (Addendum)
[facility] called and spoke to Candace [name], [facility] about the refill requested. Advised it was sent on 05/15/20 #0.47ml/3 refills. He says there is no record of receiving it nor have they ever refilled Trulicity for her. Rx indicates it was sent on 05/15/20 , Class: Sample. Will need resending.

## 2020-05-19 NOTE — Telephone Encounter (Signed)
Rx sent, see refill encounter.

## 2020-05-19 NOTE — Telephone Encounter (Signed)
Pt is still waiting on her trulicity refill and would like a sample in meantime

## 2020-05-19 NOTE — Telephone Encounter (Signed)
Pt is out of trulicity and calling checking on the refill

## 2020-05-19 NOTE — Telephone Encounter (Signed)
Please advise sample?

## 2020-05-26 ENCOUNTER — Telehealth: Payer: Self-pay

## 2020-05-26 NOTE — Telephone Encounter (Signed)
Samples ready. Patient advised.

## 2020-05-26 NOTE — Telephone Encounter (Signed)
Ok if we have one.

## 2020-05-26 NOTE — Progress Notes (Signed)
Chronic Care Management Pharmacy Assistant   Name: Candace [name]  MRN: [id number] DOB: 07-Jun-1950  Reason for Encounter: Medication Review   PCP : [name]., MD  Allergies:   Allergies  Allergen Reactions  . Lisinopril Cough  . Penicillins Swelling, Rash and Other (See Comments)    Did it involve swelling of the face/tongue/throat, SOB, or low BP? Yes Did it involve sudden or severe rash/hives, skin peeling, or any reaction on the inside of your mouth or nose? Yes Did you need to seek medical attention at a hospital or doctor's office? Yes When did it last happen?15 years If all above answers are "NO", may proceed with cephalosporin use.     Medications: Outpatient Encounter Medications as of 05/26/2020  Medication Sig Note  . acetaminophen (TYLENOL) 325 MG tablet Take 650 mg by mouth every 6 (six) hours as needed for moderate pain or headache.    Marland Kitchen atorvastatin (LIPITOR) 40 MG tablet Take 1 tablet (40 mg total) by mouth daily. 04/15/2020: Reports taking 80mg .  . benzonatate (TESSALON) 100 MG capsule Take 1 capsule (100 mg total) by mouth 3 (three) times daily as needed for cough.   . brompheniramine-pseudoephedrine-DM 30-2-10 MG/5ML syrup Take 5 mLs by mouth 3 (three) times daily as needed. (Patient not taking: Reported on 05/01/2020)   . clobetasol ointment (TEMOVATE) 4.09 % Apply 1 application topically 2 (two) times daily. Apply to affected area twice a day for one (1) week; then daily for one (1) week, then every other day for one (1), then follow up with CNM 04/02/2020: As needed  . Dulaglutide (TRULICITY) 8.11 BJ/4.7WG SOPN Inject 0.75 mg into the skin once a week.   Marland Kitchen ELIQUIS 5 MG TABS tablet Take 5 mg by mouth 2 (two) times daily.   . empagliflozin (JARDIANCE) 10 MG TABS tablet Take 1 tablet (10 mg total) by mouth daily. (Patient not taking: Reported on 05/01/2020)   . isosorbide mononitrate (IMDUR) 60 MG 24 hr tablet Take 1 tablet (60 mg total) by mouth  daily.   Marland Kitchen JARDIANCE 25 MG TABS tablet TAKE 1 TABLET (25 MG TOTAL) BY MOUTH DAILY BEFORE BREAKFAST.   Marland Kitchen Lancets (ONETOUCH DELICA PLUS [id number]) MISC USE UP TO 4 TIMES DAILY AS DIRECTED   . loperamide (IMODIUM) 2 MG capsule Take 2 mg by mouth as needed.   . metoprolol tartrate (LOPRESSOR) 100 MG tablet Take 100 mg by mouth 2 (two) times daily.   . nitroGLYCERIN (NITROSTAT) 0.4 MG SL tablet Place 1 tablet (0.4 mg total) under the tongue every 5 (five) minutes as needed for chest pain.   Marland Kitchen nystatin (NYSTATIN) powder Apply 1 application topically daily.   . ondansetron (ZOFRAN-ODT) 8 MG disintegrating tablet Take 8 mg by mouth 3 (three) times daily.   . pantoprazole (PROTONIX) 20 MG tablet Take 1 tablet (20 mg total) by mouth 2 (two) times daily.   . sertraline (ZOLOFT) 50 MG tablet TAKE 1 TABLET BY MOUTH EVERY DAY 04/02/2020: As needed  . sucralfate (CARAFATE) 1 g tablet Take one tablet in the morning and one tablet in the evening.   . [DISCONTINUED] cetirizine (ZYRTEC) 5 MG tablet Take 2 tablets (10 mg total) by mouth daily.    No facility-administered encounter medications on file as of 05/26/2020.    Current Diagnosis: Patient Active Problem List   Diagnosis Date Noted  . Vertigo   . Unsteady gait   . Acute bronchitis 03/07/2019  . Esophageal dysphagia   .  Stomach irritation   . Gastric polyp   . Esophageal lump   . Hx of colonic polyp   . Polyp of colon   . Diverticulosis of large intestine without diverticulitis   . PSVT (paroxysmal supraventricular tachycardia) ([place]) 10/03/2018  . Abnormal ECG 07/05/2018  . Tachycardia 03/27/2018  . Pain in limb 02/28/2018  . Chronic diastolic CHF (congestive heart failure) ([place]) 11/03/2017  . TIA (transient ischemic attack) 11/03/2017  . COPD suggested by initial evaluation ([place]) 08/23/2017  . Acute and chronic respiratory failure with hypoxia ([place]) 08/23/2017  . Postoperative state 08/15/2017  . Incidental pulmonary nodule 08/01/2017  .  Non-ST elevation myocardial infarction (NSTEMI), subendocardial infarction, subsequent episode of care ([place]) 06/01/2017  . B12 deficiency 12/01/2016  . Vitamin D deficiency 11/04/2016  . Depression with anxiety 09/30/2016  . Paroxysmal A-fib ([place]) 09/30/2016  . Resting tremor 09/30/2016  . Mild obstructive sleep apnea 09/30/2016  . Headache 08/18/2016  . Unstable angina ([place]) 08/03/2016  . Type 2 diabetes mellitus with complication, without long-term current use of insulin ([place]) 08/03/2016  . Chronic daily headache 05/05/2016  . Asthma 11/11/2015  . S/P CABG x 4 11/10/2015  . Coronary artery disease 11/07/2015  . Carotid artery narrowing 02/08/2014  . Chest pain 02/08/2014  . Mixed hyperlipidemia 02/08/2014  . Bilateral carotid artery stenosis 02/08/2014  . Atypical chest pain 02/07/2014  . Essential hypertension 02/07/2014  . Awareness of heartbeats 02/07/2014  . D (diarrhea) 10/05/2013  . Gastroesophageal reflux disease 10/05/2013    Goals Addressed   None    Performed cost analysis for patient, estimated yearly medication cost around  $200.00.  Follow-Up:  Pharmacist Review   Bessie Harper Pharmacist Assistant [phone]

## 2020-06-02 NOTE — Chronic Care Management (AMB) (Signed)
Chronic Care Management   Initial Visit Note   Name: Candace [name] MRN: [id number] DOB: 10/12/1950  Primary Care Provider: [name]., MD Reason for referral : Chronic Care Management   Candace [name] is a 70 y.o. year old female who is a primary care patient of [name]., MD. The CCM team was consulted for assistance with chronic disease management and care coordination.  Review of Candace [name]'s status, including review of consultants reports, relevant labs and test results was conducted today. Collaboration with appropriate care team members was performed as part of the comprehensive evaluation and provision of chronic care management services.    SDOH (Social Determinants of Health) assessments performed: Yes See Care Plan activities for detailed interventions related to SDOH  SDOH Interventions   Flowsheet Row Most Recent Value  SDOH Interventions   Food Insecurity Interventions Intervention Not Indicated  Transportation Interventions Intervention Not Indicated       Medications: Outpatient Encounter Medications as of 04/15/2020  Medication Sig Note  . brompheniramine-pseudoephedrine-DM 30-2-10 MG/5ML syrup Take 5 mLs by mouth 3 (three) times daily as needed. (Patient not taking: Reported on 05/01/2020)   . ELIQUIS 5 MG TABS tablet Take 5 mg by mouth 2 (two) times daily.   . isosorbide mononitrate (IMDUR) 60 MG 24 hr tablet Take 1 tablet (60 mg total) by mouth daily.   Marland Kitchen JARDIANCE 25 MG TABS tablet TAKE 1 TABLET (25 MG TOTAL) BY MOUTH DAILY BEFORE BREAKFAST.   Marland Kitchen loperamide (IMODIUM) 2 MG capsule Take 2 mg by mouth as needed.   . metoprolol tartrate (LOPRESSOR) 100 MG tablet Take 100 mg by mouth 2 (two) times daily.   . ondansetron (ZOFRAN-ODT) 8 MG disintegrating tablet Take 8 mg by mouth 3 (three) times daily.   . sertraline (ZOLOFT) 50 MG tablet TAKE 1 TABLET BY MOUTH EVERY DAY 04/02/2020: As needed  . acetaminophen (TYLENOL) 325 MG tablet Take 650  mg by mouth every 6 (six) hours as needed for moderate pain or headache.    Marland Kitchen atorvastatin (LIPITOR) 40 MG tablet Take 1 tablet (40 mg total) by mouth daily. 04/15/2020: Reports taking 57m.  . clobetasol ointment (TEMOVATE) 04.09% Apply 1 application topically 2 (two) times daily. Apply to affected area twice a day for one (1) week; then daily for one (1) week, then every other day for one (1), then follow up with CNM 04/02/2020: As needed  . empagliflozin (JARDIANCE) 10 MG TABS tablet Take 1 tablet (10 mg total) by mouth daily. (Patient not taking: Reported on 05/01/2020)   . Lancets (ONETOUCH DELICA PLUS [id number] MISC USE UP TO 4 TIMES DAILY AS DIRECTED   . nitroGLYCERIN (NITROSTAT) 0.4 MG SL tablet Place 1 tablet (0.4 mg total) under the tongue every 5 (five) minutes as needed for chest pain.   .Marland Kitchennystatin (NYSTATIN) powder Apply 1 application topically daily.   . pantoprazole (PROTONIX) 20 MG tablet Take 1 tablet (20 mg total) by mouth 2 (two) times daily.   . [DISCONTINUED] cetirizine (ZYRTEC) 5 MG tablet Take 2 tablets (10 mg total) by mouth daily.   . [DISCONTINUED] sucralfate (CARAFATE) 1 g tablet Take one tablet in the morning and one tablet in the evening. (Patient not taking: No sig reported)    No facility-administered encounter medications on file as of 04/15/2020.     Objective:  Patient Care Plan: Diabetes Type 2 (Adult)    Problem Identified: Glycemic Management (Diabetes, Type 2)   Priority: High  Onset Date: 04/15/2020    Long-Range Goal: Glycemic Management Optimized   Start Date: 04/15/2020  Expected End Date: 08/13/2020  Priority: High  Note:   Current Barriers:  . Chronic Disease Management support and educational needs r/t Diabetes self management.   Case Manager Clinical Goal(s):  Marland Kitchen Over the next 120 days, patient will demonstrate improved adherence to prescribed treatment plan for Diabetes self management as evidenced by daily monitoring and recording of CBG,  adherence to ADA/ carb modified diet and adherence to prescribed medication regimen.  Interventions:  . Collaboration with PCP last, First, MD/NP/PA regarding development and update of comprehensive plan of care as evidenced by provider attestation and co-signature . Inter-disciplinary care team collaboration (see longitudinal plan of care) . Reviewed medications. Encouraged to take medications as prescribed and notify team with concerns regarding prescription cost. . Provided information regarding importance of consistent blood glucose monitoring. Reports recent readings in the 120's. Recalls elevated readings the 200's. Reports several inconsistent readings/fluctuations d/t not applying enough blood on the glucose strip. Encouraged to monitor readings consistently and maintain a log.  . Reviewed s/sx of hypoglycemia and hyperglycemia along with recommended interventions. . Discussed nutritional intake. Encouraged to read nutritional labels and comply with recommended diabetic/carb modified diet. . Discussed and offered referrals for available Diabetes education classes. Declined current need for classes/resources. Agreeable to considering additional assistance if her A1C remains elevate. . Discussed importance of completing recommended DM preventive care. Encouraged continue completing footcare as recommended. Recalls completing an eye exam last year.  Patient Goals/Self-Care Activities Over the next 120 days, patient will:  - Self administer medications as prescribed - Attend all scheduled provider appointments - Monitor blood glucose levels consistently and utilize recommended interventions - Adhere to prescribed ADA/carb modified - Notify provider or care management team with questions and new concerns as needed.  Follow Up Plan:  -Will follow up within 60 days.   Patient Care Plan: Heart Failure (Adult)    Problem Identified: Symptom Exacerbation (Heart Failure)   Priority: High   Onset Date: 04/15/2020    Long-Range Goal: Symptom Exacerbation Prevented or Minimized   Start Date: 04/15/2020  Expected End Date: 08/13/2020  Priority: High  Note:   Current Barriers:  . Chronic Disease Management support and educational needs r/t CHF.  Case Manager Clinical Goal(s):   Over the next 120 days, patient will not require hospitalization or emergent evaluation d/t complications r/t CHF exacerbation.  Interventions:  . Collaboration with [name]., MD regarding development and update of comprehensive plan of care as evidenced by provider attestation and co-signature . Inter-disciplinary care team collaboration (see longitudinal plan of care) . Reviewed medications and discussed current plan for CHF management. Encouraged to continue taking medications as prescribed. Discussed weight parameters and indications for notifying a provider. Reviewed symptoms r/t fluid overload. Encouraged to monitor weight and record readings. Advised to notify a provider for weight gain greater than 3 lbs overnight or greater than 5 lbs within a week. Reports current weight of 166 lbs. . Reviewed s/sx of complications r/t CHF exacerbation. Encouraged to assess daily and notify a provider for concerning symptoms. Discussed worsening symptoms that require immediate medical attention.  . Discussed dietary intake and importance of adhering to the recommended cardiac diet.  Encouraged to continue monitoring nutritional labels and closely monitoring sodium intake.    Patient Goals/Self-Care Activities:  Over the next 120 days, patient will: -Take medications as prescribed -Follow plan for CHF symptom management and notify provider  with concerns -Monitor and record weights -Adhere to recommended cardiac diet   Follow Up Plan:  -Will follow up within the next 60 days   Patient Care Plan: Hypertension (Adult)    Problem Identified: Hypertension (Hypertension)     Long-Range Goal:  Hypertension Monitored   Start Date: 04/15/2020  Expected End Date: 08/13/2020  Priority: High  Note:    Objective:  . Last practice recorded BP readings:  BP Readings from Last 3 Encounters:  02/14/20 122/74  02/06/20 (!) 147/78  01/18/20 138/79    Current Barriers:  . Chronic Disease Management support and educational needs r/t Hypertension.  Case Manager Clinical Goal(s):  Marland Kitchen Over the next 120 days, patient will demonstrate improved adherence to prescribed treatment plan for hypertension as evidenced by taking all medications as prescribed, monitoring and recording blood pressure and adhering to a cardiac prudent/heart healthy diet.   Interventions:  . Collaboration with [name]., MD regarding development and update of comprehensive plan of care as evidenced by provider attestation and co-signature . Inter-disciplinary care team collaboration (see longitudinal plan of care) . Reviewed medications. Encouraged to continue taking as prescribed and notify provider if unable to tolerate prescribed regimen.  . Provided information regarding established blood pressure parameters along with indications for notifying a provider. Reports systolic readings have ranged from 120's to mid 546'E Diastolic readings have ranged from the 70's to 90's. Encouraged to monitor and record readings. . Discussed compliance with recommended cardiac prudent diet. Encouraged to read nutrition labels and avoid highly processed foods when possible. . Discussed complications of uncontrolled blood pressure. Reviewed s/sx of heart attack, stroke and worsening symptoms that require immediate medical attention.   Patient Goals/Self-Care Activities: -Self administer medications as prescribed -Attend all scheduled provider appointments -Monitor and record blood pressure -Adhere to recommended cardiac prudent/heart healthy diet -Notify provider or care management team with questions and new concerns as  needed    Follow Up Plan:  -Will follow up within the next 60 days        Candace [name] was given information about Chronic Care Management services including:  1. CCM service includes personalized support from designated clinical [name] supervised by her physician, including individualized plan of care and coordination with other care providers 2. 24/7 contact phone numbers for assistance for urgent and routine care needs. 3. Service will only be billed when office clinical [name] spend 20 minutes or more in a month to coordinate care. 4. Only one practitioner may furnish and bill the service in a calendar month. 5. The patient may stop CCM services at any time (effective at the end of the month) by phone call to the office [name]. 6. The patient will be responsible for cost sharing (co-pay) of up to 20% of the service fee (after annual deductible is met).  Patient agreed to services and verbal consent obtained.      PLAN A member of the care management team will follow up with Candace [name] within the next 60 days.    [name] Care Management [facility] [phone]

## 2020-06-02 NOTE — Patient Instructions (Signed)
Thank you for allowing the Chronic Care Management team to participate in your care.    Patient Care Plan: Diabetes Type 2 (Adult)    Problem Identified: Glycemic Management (Diabetes, Type 2)   Priority: High  Onset Date: 04/15/2020    Long-Range Goal: Glycemic Management Optimized   Start Date: 04/15/2020  Expected End Date: 08/13/2020  Priority: High  Note:   Current Barriers:  . Chronic Disease Management support and educational needs r/t Diabetes self management.   Case Manager Clinical Goal(s):  Marland Kitchen Over the next 120 days, patient will demonstrate improved adherence to prescribed treatment plan for Diabetes self management as evidenced by daily monitoring and recording of CBG, adherence to ADA/ carb modified diet and adherence to prescribed medication regimen.  Interventions:  . Collaboration with PCP last, First, MD/NP/PA regarding development and update of comprehensive plan of care as evidenced by provider attestation and co-signature . Inter-disciplinary care team collaboration (see longitudinal plan of care) . Reviewed medications. Encouraged to take medications as prescribed and notify team with concerns regarding prescription cost. . Provided information regarding importance of consistent blood glucose monitoring. Reports recent readings in the 120's. Recalls elevated readings the 200's. Reports several inconsistent readings/fluctuations d/t not applying enough blood on the glucose strip. Encouraged to monitor readings consistently and maintain a log.  . Reviewed s/sx of hypoglycemia and hyperglycemia along with recommended interventions. . Discussed nutritional intake. Encouraged to read nutritional labels and comply with recommended diabetic/carb modified diet. . Discussed and offered referrals for available Diabetes education classes. Declined current need for classes/resources. Agreeable to considering additional assistance if her A1C remains elevate. . Discussed importance  of completing recommended DM preventive care. Encouraged continue completing footcare as recommended. Recalls completing an eye exam last year.  Patient Goals/Self-Care Activities Over the next 120 days, patient will:  - Self administer medications as prescribed - Attend all scheduled provider appointments - Monitor blood glucose levels consistently and utilize recommended interventions - Adhere to prescribed ADA/carb modified - Notify provider or care management team with questions and new concerns as needed.  Follow Up Plan:  -Will follow up within 60 days.   Patient Care Plan: Heart Failure (Adult)    Problem Identified: Symptom Exacerbation (Heart Failure)   Priority: High  Onset Date: 04/15/2020    Long-Range Goal: Symptom Exacerbation Prevented or Minimized   Start Date: 04/15/2020  Expected End Date: 08/13/2020  Priority: High  Note:   Current Barriers:  . Chronic Disease Management support and educational needs r/t CHF.  Case Manager Clinical Goal(s):   Over the next 120 days, patient will not require hospitalization or emergent evaluation d/t complications r/t CHF exacerbation.  Interventions:  . Collaboration with [name]., MD regarding development and update of comprehensive plan of care as evidenced by provider attestation and co-signature . Inter-disciplinary care team collaboration (see longitudinal plan of care) . Reviewed medications and discussed current plan for CHF management. Encouraged to continue taking medications as prescribed. Discussed weight parameters and indications for notifying a provider. Reviewed symptoms r/t fluid overload. Encouraged to monitor weight and record readings. Advised to notify a provider for weight gain greater than 3 lbs overnight or greater than 5 lbs within a week. Reports current weight of 166 lbs. . Reviewed s/sx of complications r/t CHF exacerbation. Encouraged to assess daily and notify a provider for concerning  symptoms. Discussed worsening symptoms that require immediate medical attention.  . Discussed dietary intake and importance of adhering to the recommended cardiac diet.  Encouraged to continue monitoring nutritional labels and closely monitoring sodium intake.    Patient Goals/Self-Care Activities:  Over the next 120 days, patient will: -Take medications as prescribed -Follow plan for CHF symptom management and notify provider with concerns -Monitor and record weights -Adhere to recommended cardiac diet   Follow Up Plan:  -Will follow up within the next 60 days   Patient Care Plan: Hypertension (Adult)    Problem Identified: Hypertension (Hypertension)     Long-Range Goal: Hypertension Monitored   Start Date: 04/15/2020  Expected End Date: 08/13/2020  Priority: High  Note:    Objective:  . Last practice recorded BP readings:  BP Readings from Last 3 Encounters:  02/14/20 122/74  02/06/20 (!) 147/78  01/18/20 138/79    Current Barriers:  . Chronic Disease Management support and educational needs r/t Hypertension.  Case Manager Clinical Goal(s):  Marland Kitchen Over the next 120 days, patient will demonstrate improved adherence to prescribed treatment plan for hypertension as evidenced by taking all medications as prescribed, monitoring and recording blood pressure and adhering to a cardiac prudent/heart healthy diet.   Interventions:  . Collaboration with [name]., MD regarding development and update of comprehensive plan of care as evidenced by provider attestation and co-signature . Inter-disciplinary care team collaboration (see longitudinal plan of care) . Reviewed medications. Encouraged to continue taking as prescribed and notify provider if unable to tolerate prescribed regimen.  . Provided information regarding established blood pressure parameters along with indications for notifying a provider. Reports systolic readings have ranged from 120's to mid 094'B  Diastolic readings have ranged from the 70's to 90's. Encouraged to monitor and record readings. . Discussed compliance with recommended cardiac prudent diet. Encouraged to read nutrition labels and avoid highly processed foods when possible. . Discussed complications of uncontrolled blood pressure. Reviewed s/sx of heart attack, stroke and worsening symptoms that require immediate medical attention.   Patient Goals/Self-Care Activities: -Self administer medications as prescribed -Attend all scheduled provider appointments -Monitor and record blood pressure -Adhere to recommended cardiac prudent/heart healthy diet -Notify provider or care management team with questions and new concerns as needed    Follow Up Plan:  -Will follow up within the next 60 days        Ms. Huyett was given information about Chronic Care Management services including:  1. CCM service includes personalized support from designated clinical staff supervised by her physician, including individualized plan of care and coordination with other care providers 2. 24/7 contact phone numbers for assistance for urgent and routine care needs. 3. Service will only be billed when office clinical staff spend 20 minutes or more in a month to coordinate care. 4. Only one practitioner may furnish and bill the service in a calendar month. 5. The patient may stop CCM services at any time (effective at the end of the month) by phone call to the office staff. 6. The patient will be responsible for cost sharing (co-pay) of up to 20% of the service fee (after annual deductible is met).  Patient agreed to services and verbal consent obtained.       Ms. Ozturk verbalized understanding of the information discussed during the telephonic outreach. Declined need for mailed/printed instructions. A member of the care management team will follow up with Ms. Carbary within the next 60 days.    [name]  Care Management [facility] [phone]

## 2020-06-03 NOTE — Progress Notes (Signed)
Chronic Care Management Pharmacy Note  06/10/2020 Name:  Candace [name] MRN:  [id number] DOB:  [birth year]  Subjective: Candace [name] is an 70 y.o. year old female who is a primary patient of [name]., MD.  The CCM team was consulted for assistance with disease management and care coordination needs.    Engaged with patient by telephone for initial visit in response to provider referral for pharmacy case management and/or care coordination services.   Consent to Services:  The patient was given the following information about Chronic Care Management services today, agreed to services, and gave verbal consent: 1. CCM service includes personalized support from designated clinical staff supervised by the primary care provider, including individualized plan of care and coordination with other care providers 2. 24/7 contact phone numbers for assistance for urgent and routine care needs. 3. Service will only be billed when office clinical staff spend 20 minutes or more in a month to coordinate care. 4. Only one practitioner may furnish and bill the service in a calendar month. 5.The patient may stop CCM services at any time (effective at the end of the month) by phone call to the office staff. 6. The patient will be responsible for cost sharing (co-pay) of up to 20% of the service fee (after annual deductible is met). Patient agreed to services and consent obtained.  Patient Care Team: [name]., MD as PCP - General (Family Medicine) Corey Skains, MD as Consulting Physician (Cardiology) Sharlotte Alamo, DPM (Podiatry) Neldon Labella, RN as Case Manager Virgel Manifold, MD as Consulting Physician (Gastroenterology) [facility], [place]  Recent office visits: 05/13/20: Patient presented to Dr. Rosanna Randy for viral URI. Patient started on benzonatate.  05/01/20: Patient presented to Dr. Rosanna Randy for follow-up. A1c 12.2%. Patient started on Trulicity 6.72 mg weekly.  Carafate  04/02/20: Patient presented to [facility], LPN for AWV.   Recent consult visits: 02/27/21: Patient presented to Hassell Done, NP . Patient started on Eliquis 5 mg twice daily for A-Fib. Non-adherence to atorvastatin   Hospital visits: None in previous 6 months  Objective:  Lab Results  Component Value Date   CREATININE 0.60 02/06/2020   BUN 10 02/06/2020   GFRNONAA 94 02/06/2020   GFRAA 108 02/06/2020   NA 142 02/06/2020   K 3.8 02/06/2020   CALCIUM 9.4 02/06/2020   CO2 19 (L) 02/06/2020    Lab Results  Component Value Date/Time   HGBA1C 12.2 (A) 05/01/2020 09:55 AM   HGBA1C 12.4 (A) 12/06/2019 11:35 AM   HGBA1C 9.8 (H) 08/30/2019 07:15 AM   HGBA1C 9.2 07/10/2019 12:00 AM   HGBA1C 9.5 (H) 03/06/2019 07:25 PM   HGBA1C 8.3 (H) 07/14/2013 05:42 AM    Last diabetic Eye exam:  Lab Results  Component Value Date/Time   HMDIABEYEEXA No Retinopathy 06/28/2019 12:00 AM    Last diabetic Foot exam: No results found for: HMDIABFOOTEX   Lab Results  Component Value Date   CHOL 234 (H) 08/31/2019   HDL 49 08/31/2019   LDLCALC 163 (H) 08/31/2019   TRIG 110 08/31/2019   CHOLHDL 4.8 08/31/2019    Hepatic Function Latest Ref Rng & Units 02/06/2020 11/21/2019 08/29/2019  Total Protein 6.0 - 8.5 g/dL 6.9 - 7.0  Albumin 3.8 - 4.8 g/dL 4.4 - 4.2  AST 0 - 40 IU/L 18 - 21  ALT 0 - 32 IU/L 13 - 15  Alk Phosphatase 44 - 121 IU/L 135(H) 139(H) 98  Total  Bilirubin 0.0 - 1.2 mg/dL 0.2 - 0.6  Bilirubin, Direct 0.0 - 0.2 mg/dL - - <0.1    Lab Results  Component Value Date/Time   TSH 1.180 01/03/2019 09:57 AM   TSH 1.262 09/14/2018 05:04 PM   TSH 1.661 12/23/2016 07:03 PM   TSH 0.818 10/01/2016 09:35 AM    CBC Latest Ref Rng & Units 08/31/2019 08/29/2019 06/10/2019  WBC 4.0 - 10.5 K/uL 6.8 7.3 5.5  Hemoglobin 12.0 - 15.0 g/dL 12.4 13.9 14.2  Hematocrit 36.0 - 46.0 % 37.7 40.5 41.4  Platelets 150 - 400 K/uL 190 229 212    Lab Results  Component Value Date/Time   VD25OH  12.2 (L) 10/01/2016 09:35 AM    Clinical ASCVD: Yes  The ASCVD Risk score Mikey Bussing DC Jr., et al., 2013) failed to calculate for the following reasons:   The patient has a prior MI or stroke diagnosis    Depression screen [facility] 2/9 04/15/2020 04/02/2020 02/06/2020  Decreased Interest 0 0 1  Down, Depressed, Hopeless 0 0 0  PHQ - 2 Score 0 0 1  Altered sleeping - - 1  Tired, decreased energy - - 1  Change in appetite - - 1  Feeling bad or failure about yourself  - - 0  Trouble concentrating - - 0  Moving slowly or fidgety/restless - - 0  Suicidal thoughts - - 0  PHQ-9 Score - - 4  Difficult doing work/chores - - Not difficult at all  Some recent data might be hidden    Social History   Tobacco Use  Smoking Status Former Research scientist (life sciences)  . Packs/day: 0.50  . Types: Cigarettes  . Quit date: 10/07/2001  . Years since quitting: 18.6  Smokeless Tobacco Never Used   BP Readings from Last 3 Encounters:  05/13/20 (!) 166/95  05/01/20 121/71  02/14/20 122/74   Pulse Readings from Last 3 Encounters:  05/13/20 86  05/01/20 90  02/14/20 80   Wt Readings from Last 3 Encounters:  05/13/20 169 lb (76.7 kg)  05/01/20 169 lb (76.7 kg)  02/14/20 167 lb 3.2 oz (75.8 kg)    Assessment/Interventions: Review of patient past medical history, allergies, medications, health status, including review of consultants reports, laboratory and other test data, was performed as part of comprehensive evaluation and provision of chronic care management services.   SDOH:  (Social Determinants of Health) assessments and interventions performed: Yes SDOH Interventions   Flowsheet Row Most Recent Value  SDOH Interventions   Financial Strain Interventions Intervention Not Indicated      CCM Care Plan  Allergies  Allergen Reactions  . Lisinopril Cough  . Penicillins Swelling, Rash and Other (See Comments)    Did it involve swelling of the face/tongue/throat, SOB, or low BP? Yes Did it involve sudden or severe  rash/hives, skin peeling, or any reaction on the inside of your mouth or nose? Yes Did you need to seek medical attention at a hospital or doctor's office? Yes When did it last happen?15 years If all above answers are "NO", may proceed with cephalosporin use.     Medications Reviewed Today    Reviewed by Germaine Pomfret, [facility] (Pharmacist) on 06/10/20 at 1122  Med List Status: <None>  Medication Order Taking? Sig Documenting Provider Last Dose Status Informant  acetaminophen (TYLENOL) 325 MG tablet [id number] Yes Take 650 mg by mouth every 6 (six) hours as needed for moderate pain or headache.  [provider] Taking Active Self  atorvastatin (LIPITOR) 40 MG  tablet [id number] No Take 1 tablet (40 mg total) by mouth daily.  Patient not taking: Reported on 06/06/2020   Fritzi Mandes, MD Not Taking Active            Med Note Michaelle Birks, Glean Salvo   Fri Jun 06, 2020  9:36 AM)    benzonatate (TESSALON) 100 MG capsule [id number] No Take 1 capsule (100 mg total) by mouth 3 (three) times daily as needed for cough.  Patient not taking: Reported on 06/06/2020   [name]., MD Not Taking Active   bismuth subsalicylate (PEPTO BISMOL) 262 MG chewable tablet [id number] Yes Chew 524 mg by mouth as needed. [provider] Taking Active         Discontinued 03/06/19 1830   Dulaglutide (TRULICITY) 9.16 BW/4.6KZ SOPN [id number] Yes Inject 0.75 mg into the skin once a week. [name]., MD Taking Active   ELIQUIS 5 MG TABS tablet [id number] Yes Take 5 mg by mouth 2 (two) times daily. [provider] Taking Active            Med Note Michaelle Birks, Glean Salvo   Fri Jun 06, 2020 11:56 AM) Prescribed by Dr. Nehemiah Massed  isosorbide mononitrate (IMDUR) 60 MG 24 hr tablet [id number] Yes Take 1 tablet (60 mg total) by mouth daily. Loletha Grayer, MD Taking Active Self           Med Note Michaelle Birks, Glean Salvo   Fri Jun 06, 2020 11:56 AM) Prescribed by Dr. Nehemiah Massed   JARDIANCE  25 MG TABS tablet [id number] Yes TAKE 1 TABLET BY MOUTH DAILY BEFORE BREAKFAST. [name]., MD Taking Active   Lancets (ONETOUCH DELICA PLUS [id number]) [place] [id number]  USE UP TO 4 TIMES DAILY AS DIRECTED [name]., MD  Active Self  loperamide (IMODIUM) 2 MG capsule [id number] Yes Take 2 mg by mouth as needed. [provider] Taking Active   metoprolol tartrate (LOPRESSOR) 100 MG tablet [id number] Yes Take 100 mg by mouth 2 (two) times daily. [provider] Taking Active Self           Med Note Michaelle Birks, Cathe Mons A   Fri Jun 06, 2020 11:56 AM) Prescribed by Dr. Nehemiah Massed   nitroGLYCERIN (NITROSTAT) 0.4 MG SL tablet [id number]  Place 1 tablet (0.4 mg total) under the tongue every 5 (five) minutes as needed for chest pain. Bettey Costa, MD  Active Self           Med Note Johny Drilling Mar 27, 2018  2:39 PM)    nystatin (NYSTATIN) powder [id number] Yes Apply 1 application topically daily.  Patient taking differently: Apply 1 application topically daily as needed.   Rubie Maid, MD Taking Active   ondansetron (ZOFRAN-ODT) 8 MG disintegrating tablet [id number] Yes Take 8 mg by mouth every 8 (eight) hours as needed. [provider] Taking Active   pantoprazole (PROTONIX) 20 MG tablet [id number]  Take 1 tablet (20 mg total) by mouth 2 (two) times daily. [name]., MD  Active   sertraline (ZOLOFT) 50 MG tablet [id number] Yes TAKE 1 TABLET BY MOUTH EVERY DAY [name]., MD Taking Active            Med Note [facility], Cathe Mons A   Fri Jun 06, 2020  9:28 AM)    sucralfate (CARAFATE) 1 g tablet [id number] No Take one tablet in the morning and one tablet in the evening.  Patient not taking: Reported on  06/06/2020   [name]., MD Not Taking Active           Patient Active Problem List   Diagnosis Date Noted  . Vertigo   . Unsteady gait   . Acute bronchitis 03/07/2019  . Esophageal dysphagia   . Stomach irritation    . Gastric polyp   . Esophageal lump   . Hx of colonic polyp   . Polyp of colon   . Diverticulosis of large intestine without diverticulitis   . PSVT (paroxysmal supraventricular tachycardia) ([place]) 10/03/2018  . Abnormal ECG 07/05/2018  . Tachycardia 03/27/2018  . Pain in limb 02/28/2018  . Chronic diastolic CHF (congestive heart failure) ([place]) 11/03/2017  . TIA (transient ischemic attack) 11/03/2017  . COPD suggested by initial evaluation ([place]) 08/23/2017  . Acute and chronic respiratory failure with hypoxia ([place]) 08/23/2017  . Postoperative state 08/15/2017  . Incidental pulmonary nodule 08/01/2017  . Non-ST elevation myocardial infarction (NSTEMI), subendocardial infarction, subsequent episode of care ([place]) 06/01/2017  . B12 deficiency 12/01/2016  . Vitamin D deficiency 11/04/2016  . Depression with anxiety 09/30/2016  . Paroxysmal A-fib ([place]) 09/30/2016  . Resting tremor 09/30/2016  . Mild obstructive sleep apnea 09/30/2016  . Headache 08/18/2016  . Unstable angina ([place]) 08/03/2016  . Type 2 diabetes mellitus with complication, without long-term current use of insulin ([place]) 08/03/2016  . Chronic daily headache 05/05/2016  . Asthma 11/11/2015  . S/P CABG x 4 11/10/2015  . Coronary artery disease 11/07/2015  . Carotid artery narrowing 02/08/2014  . Chest pain 02/08/2014  . Mixed hyperlipidemia 02/08/2014  . Bilateral carotid artery stenosis 02/08/2014  . Atypical chest pain 02/07/2014  . Essential hypertension 02/07/2014  . Awareness of heartbeats 02/07/2014  . D (diarrhea) 10/05/2013  . Gastroesophageal reflux disease 10/05/2013    Immunization History  Administered Date(s) Administered  . Fluad Quad(high Dose 65+) 12/28/2018  . Influenza, High Dose Seasonal PF 12/24/2016, 12/08/2017  . Influenza,inj,Quad PF,6+ Mos 12/19/2015, 01/18/2020  . Influenza-Unspecified 03/06/2015  . Moderna Sars-Covid-2 Vaccination 04/14/2019, 05/12/2019, 01/28/2020  . Pneumococcal  Conjugate-13 03/16/2018  . Pneumococcal Polysaccharide-23 12/24/2016  . Tdap 07/28/2010    Conditions to be addressed/monitored:  Hypertension, Hyperlipidemia, Diabetes, Atrial Fibrillation, Heart Failure, Coronary Artery Disease, GERD, Asthma, Depression and Anxiety  Care Plan : General Pharmacy (Adult)  Updates made by Germaine Pomfret, RPH since 06/10/2020 12:00 AM    Problem: Hypertension, Hyperlipidemia, Diabetes, Atrial Fibrillation, Heart Failure, Coronary Artery Disease, GERD, Asthma, Depression and Anxiety   Priority: High    Long-Range Goal: Patient-Specific Goal   Start Date: 06/06/2020  Expected End Date: 12/07/2020  This Visit's Progress: On track  Priority: High  Note:   Current Barriers:  . Unable to achieve control of diabetes  . Does not adhere to prescribed medication regimen   Pharmacist Clinical Goal(s):  Marland Kitchen Over the next 90 days, patient will achieve control of diabetes as evidenced by A1c less than 8% . adhere to prescribed medication regimen as evidenced by utilization of enhanced pharmacy services through collaboration with PharmD and provider.   Interventions: . 1:1 collaboration with [name]., MD regarding development and update of comprehensive plan of care as evidenced by provider attestation and co-signature . Inter-disciplinary care team collaboration (see longitudinal plan of care) . Comprehensive medication review performed; medication list updated in electronic medical record  Hyperlipidemia: (LDL goal < 70) -History of CABG x4 2017, 2018,  -Uncontrolled -Current treatment: . Atorvastatin 40 mg daily (ran out) -Medications  previously tried: NA  - Patient reports non-adherence to her atorvastatin. She denies side effects or tolerability concerns, but would often forget to take it because she kept it separate. She currently is completely out of the medication. -Educated on Importance of limiting foods high in  cholesterol; -Recommended to continue current medication  Diabetes (A1c goal <7%) -Uncontrolled -Current medications: . Trulicity 3.97 mg weekly on Tuesdays . Jardiance 25 mg daily  -Medications previously tried: Glipizide, Lantus, Tradjenta, Metformin, Metformin XR, Ozempic   -Current home glucose readings   Fasting  11-Mar 151  10-Mar 179  9-Mar 187  8-Mar 227  7-Mar 172  6-Mar 172  5-Mar 187  4-Mar 141  3-Mar 157  2-Mar 164  1-Mar 238  Average 180   -Denies hypoglycemic/hyperglycemic symptoms -Current meal patterns:  . breakfast: Often skips  . lunch: Often skips . dinner: Country style steak + gravy + mashed potatoes + broccoli + cheese sauce  . snacks: Yogurt, peanut butter crackers,  . drinks: Glucerna shakes, water, regular ginger ale  -Current exercise: walking twice daily (15 minute sessions)  -Educated on A1c and blood sugar goals; Exercise goal of 150 minutes per week; Carbohydrate counting and/or plate method -Counseled to check feet daily and get yearly eye exams -Recommended increasing Trulicity to 1.5 mg weekly  Atrial Fibrillation (Goal: prevent stroke and major bleeding) -Controlled -CHADSVASC: 6 -Current treatment: . Rate control: Metoprolol tartrate 100 mg twice daily  . Anticoagulation: Eliquis 5 mg twice daily  -Medications previously tried: NA -Home BP and HR readings: NA  -Counseled on importance of adherence to anticoagulant exactly as prescribed; avoidance of NSAIDs due to increased bleeding risk with anticoagulants; -Recommended to continue current medication  Heart Failure (Goal: manage symptoms and prevent exacerbations) -Controlled -Last ejection fraction: 55-60% (Date: June 2021) -HF type: Diastolic -NYHA Class: III (marked limitation of activity) -AHA HF Stage: C (Heart disease and symptoms present) -Current treatment: . Imdur 60 mg daily  . Metoprolol tartrate 100 mg twice daily  -Medications previously tried: NA  - Patient  reports dysnpea most often after walking. She reports one instance of chest pain while feeling ill, resolved on its own.  -Current home BP/HR readings: NA -Educated on Importance of weighing daily; if you gain more than 3 pounds in one day or 5 pounds in one week, contact provider's office -Recommended to continue current medication  Depression/Anxiety (Goal: maintain stable mood) -Controlled -Current treatment: . Sertraline 50 mg daily  -Medications previously tried/failed: NA -PHQ9: 0 -GAD7: 1 - Patient reports she manages her stress by talking with daughter, reading, or medidating  -Educated on Benefits of medication for symptom control -Recommended to continue current medication  GERD with dysphagia (Goal: prevent heartburn or reflux symptoms) -Uncontrolled -Current treatment  . None -Medications previously tried: Sucralfate, Pantoprazole  -Stopped sucralfate. Hard to swallow, feels yucky after.  - Ran out of pantoprazole, never got refilled. Felt that it helped with her symptoms -Recommended restarting pantoprazole, 40 mg daily (for patient convenience)   Patient Goals/Self-Care Activities . Over the next 90 days, patient will:  - check glucose daily, before breakfast, document, and provide at future appointments check blood pressure weekly, document, and provide at future appointments  Follow Up Plan: Telephone follow up appointment with care management team member scheduled for: 09/05/2020 at 9:00 AM      Medication Assistance: None required.  Patient affirms current coverage meets needs.  Patient's preferred pharmacy is:  [place], [place]  [address] [place] [address] Phone: [phone] Fax: [phone]  Uses pill box? No - doesn't think she needs Pt endorses 100% compliance  We discussed: Benefits of medication synchronization, packaging and delivery as well as enhanced pharmacist  oversight with [facility]. Patient decided to: Utilize [facility] for medication synchronization, packaging and delivery   Verbal consent obtained for [facility] enhanced pharmacy services (medication synchronization, adherence packaging, delivery coordination). A medication sync plan was created to allow patient to get all medications delivered once every 30 to 90 days per patient preference. Patient understands they have freedom to choose pharmacy and clinical pharmacist will coordinate care between all prescribers and [facility].  Care Plan and Follow Up Patient Decision:  Patient agrees to Care Plan and Follow-up.  Plan: Face to Face appointment with care management team member scheduled for: 09/05/20 at 9:00 AM  [place] [phone]

## 2020-06-04 ENCOUNTER — Other Ambulatory Visit: Payer: Self-pay | Admitting: Family Medicine

## 2020-06-04 DIAGNOSIS — E118 Type 2 diabetes mellitus with unspecified complications: Secondary | ICD-10-CM

## 2020-06-04 NOTE — Telephone Encounter (Signed)
Requested Prescriptions  Pending Prescriptions Disp Refills  . JARDIANCE 25 MG TABS tablet [Pharmacy Med Name: JARDIANCE 25 MG TABLET] 30 tablet 1    Sig: TAKE 1 TABLET BY MOUTH DAILY BEFORE BREAKFAST.     Endocrinology:  Diabetes - SGLT2 Inhibitors Failed - 06/04/2020  3:25 AM      Failed - LDL in normal range and within 360 days    Ldl Cholesterol, Calc  Date Value Ref Range Status  07/14/2013 154 (H) 0 - 100 mg/dL Final   LDL Chol Calc (NIH)  Date Value Ref Range Status  01/03/2019 147 (H) 0 - 99 mg/dL Final   LDL Cholesterol  Date Value Ref Range Status  08/31/2019 163 (H) 0 - 99 mg/dL Final    Comment:           Total Cholesterol/HDL:CHD Risk Coronary Heart Disease Risk Table                     Men   Women  1/2 Average Risk   3.4   3.3  Average Risk       5.0   4.4  2 X Average Risk   9.6   7.1  3 X Average Risk  23.4   11.0        Use the calculated Patient Ratio above and the CHD Risk Table to determine the patient's CHD Risk.        ATP III CLASSIFICATION (LDL):  <100     mg/dL   Optimal  100-129  mg/dL   Near or Above                    Optimal  130-159  mg/dL   Borderline  160-189  mg/dL   High  >190     mg/dL   Very High Performed at [facility], [address] [place], [place] [address]          Failed - HBA1C is between 0 and 7.9 and within 180 days    Hemoglobin A1C  Date Value Ref Range Status  05/01/2020 12.2 (A) 4.0 - 5.6 % Final  07/10/2019 9.2  Final   Hgb A1c MFr Bld  Date Value Ref Range Status  08/30/2019 9.8 (H) 4.8 - 5.6 % Final    Comment:    (NOTE) Pre diabetes:          5.7%-6.4% Diabetes:              >6.4% Glycemic control for   <7.0% adults with diabetes          Passed - Cr in normal range and within 360 days    Creat  Date Value Ref Range Status  03/24/2017 1.39 (H) 0.50 - 0.99 mg/dL Final    Comment:    For patients >52 years of age, the reference limit for Creatinine is approximately 13% higher for  people identified as African-American. .    Creatinine, Ser  Date Value Ref Range Status  02/06/2020 0.60 0.57 - 1.00 mg/dL Final         Passed - AA eGFR in normal range and within 360 days    EGFR (African American)  Date Value Ref Range Status  04/29/2014 >60 >58m/min Final  09/16/2013 >60  Final   GFR calc Af Amer  Date Value Ref Range Status  02/06/2020 108 >59 mL/min/1.73 Final    Comment:    **In accordance with recommendations from the NKF-ASN Task  force,**   [facility] is in the process of updating its eGFR calculation to the   2021 CKD-EPI creatinine equation that estimates kidney function   without a race variable.    EGFR (Non-African Amer.)  Date Value Ref Range Status  04/29/2014 >60 >23m/min Final    Comment:    eGFR values <631mmin/1.73 m2 may be an indication of chronic kidney disease (CKD). Calculated eGFR, using the MRDR Study equation, is useful in  patients with stable renal function. The eGFR calculation will not be reliable in acutely ill patients when serum creatinine is changing rapidly. It is not useful in patients on dialysis. The eGFR calculation may not be applicable to patients at the low and high extremes of body sizes, pregnant women, and vegetarians.   09/16/2013 >60  Final    Comment:    eGFR values <6062min/1.73 m2 may be an indication of chronic kidney disease (CKD). Calculated eGFR is useful in patients with stable renal function. The eGFR calculation will not be reliable in acutely ill patients when serum creatinine is changing rapidly. It is not useful in  patients on dialysis. The eGFR calculation may not be applicable to patients at the low and high extremes of body sizes, pregnant women, and vegetarians.    GFR calc non Af Amer  Date Value Ref Range Status  02/06/2020 94 >59 mL/min/1.73 Final         Passed - Valid encounter within last 6 months    Recent Outpatient Visits          3 weeks ago [facility]   1 month ago [facility]ph[facility]   3 months ago Type 2 diabetes mellitus with complication, [facility] long-term [facility] use [facility]   6 months ago Type 2 diabetes mellitus with complication, without long-term current use of insulin [facility]   7 months ago Gastroesophageal reflux disease, unspecified whether esophagitis present   [facility]      Future Appointments            In 2 months [facility]

## 2020-06-05 ENCOUNTER — Telehealth: Payer: Self-pay

## 2020-06-05 NOTE — Progress Notes (Signed)
Left voice message to confirmed patient telephone appointment on 06/06/2020 for CCM at 9:00 am with Junius Argyle the Clinical pharmacist.   [place] Pharmacist Assistant [phone]

## 2020-06-06 ENCOUNTER — Ambulatory Visit (INDEPENDENT_AMBULATORY_CARE_PROVIDER_SITE_OTHER): Payer: Medicare Other

## 2020-06-06 ENCOUNTER — Other Ambulatory Visit: Payer: Self-pay | Admitting: *Deleted

## 2020-06-06 ENCOUNTER — Telehealth: Payer: Self-pay

## 2020-06-06 DIAGNOSIS — I480 Paroxysmal atrial fibrillation: Secondary | ICD-10-CM

## 2020-06-06 DIAGNOSIS — I5032 Chronic diastolic (congestive) heart failure: Secondary | ICD-10-CM

## 2020-06-06 DIAGNOSIS — E1159 Type 2 diabetes mellitus with other circulatory complications: Secondary | ICD-10-CM

## 2020-06-06 DIAGNOSIS — E118 Type 2 diabetes mellitus with unspecified complications: Secondary | ICD-10-CM | POA: Diagnosis not present

## 2020-06-06 DIAGNOSIS — E785 Hyperlipidemia, unspecified: Secondary | ICD-10-CM

## 2020-06-06 DIAGNOSIS — R1319 Other dysphagia: Secondary | ICD-10-CM

## 2020-06-06 DIAGNOSIS — F418 Other specified anxiety disorders: Secondary | ICD-10-CM

## 2020-06-06 DIAGNOSIS — E1169 Type 2 diabetes mellitus with other specified complication: Secondary | ICD-10-CM

## 2020-06-06 DIAGNOSIS — I152 Hypertension secondary to endocrine disorders: Secondary | ICD-10-CM

## 2020-06-06 NOTE — Telephone Encounter (Addendum)
Per initial request  per staff message from Junius Argyle, Pharmacist from [facility]: Candace [name], [place] Rx Refill Good afternoon. I spoke with this patient today and she has been out of a couple of her medications. Can you please send in refills of the following prescriptions to [facility]?    Atorvastatin 40 mg daily  Ondansetron 8 mg ODT every 8 hours as needed (last prescribed by Dr. Rosanna Randy)  One Touch test strips - testing once daily (DX E11.8)  Pantoprazole 40 mg daily (previously taking 20 mg twice daily, can we change her to once daily for convenience)    Thank you very much,  [name]  [phone]   Requested medication (s) are due for refill today: One Touch Strips, yes  Requested medication (s) are on the active medication list: No  Last refill: ?  Future visit scheduled: yes  Notes to clinic:  d/c'd 09/19/18 by Dr Serafina Royals  Requested medication (s) are due for refill today: Odansetron, yes  Requested medication (s) are on the active medication list: yes  Last refill: ?  Future visit scheduled: yes  Notes to clinic:  not delegated; historical provider  Requested medication (s) are due for refill today: Pantoprazole, yes  Requested medication (s) are on the active medication list: yes  Last refill:  ?  Future visit scheduled: yes  Notes to clinic:  pharmacy request to change to daily  Requested medication (s) are due for refill today: Atorvastatin, yes  Requested medication (s) are on the active medication list: yes  Last refill: ?  Future visit scheduled: yes  Notes to clinic:  Historical provider                Requested Prescriptions  Pending Prescriptions Disp Refills   pantoprazole (PROTONIX) 20 MG tablet 60 tablet 11    Sig: Take 1 tablet (20 mg total) by mouth 2 (two) times daily.      Gastroenterology: Proton Pump Inhibitors  Passed - 06/06/2020 12:52 PM      Passed - Valid encounter within last 12 months    Recent Outpatient Visits           3 weeks ago [place] [name]., MD   1 month ago Esophageal dysphagia   [facility] [name]., MD   4 months ago Type 2 diabetes mellitus with complication, without long-term current use of insulin [facility])   [facility] [name]., MD   6 months ago Type 2 diabetes mellitus with complication, without long-term current use of insulin [facility])   [facility] [name]., MD   8 months ago Gastroesophageal reflux disease, unspecified whether esophagitis present   [place] [name]., MD       Future Appointments             In 2 months [name]., MD [facility]               ondansetron (ZOFRAN-ODT) 8 MG disintegrating tablet 20 tablet     Sig: Take 1 tablet (8 mg total) by mouth every 8 (eight) hours as needed.      Not Delegated - Gastroenterology: Antiemetics Failed - 06/06/2020 12:52 PM      Failed - This refill cannot be delegated  Passed - Valid encounter within last 6 months    Recent Outpatient Visits           3 weeks ago [place] [name]., MD   1 month ago Esophageal dysphagia   [facility] [name]., MD   4 months ago Type 2 diabetes mellitus with complication, without long-term current use of insulin [facility])   [facility] [name]., MD   6 months ago Type 2 diabetes mellitus with complication, without long-term current use of insulin [facility])   [facility] [name][facility] MD   8 months ago Gastroesophageal reflux disease, unspecified whether esophagitis present   [facility] [name] MD       Future Appointments              In 2 months [name]., MD [facility], [facility]               atorvastatin (LIPITOR) 40 MG tablet 30 tablet 1    Sig: Take 1 tablet (40 mg total) by mouth daily.      Cardiovascular:  Antilipid - Statins Failed - 06/06/2020 12:52 PM      Failed - Total Cholesterol in normal range and within 360 days    Cholesterol, Total  Date Value Ref Range Status  01/03/2019 214 (H) 100 - 199 mg/dL Final   Cholesterol  Date Value Ref Range Status  08/31/2019 234 (H) 0 - 200 mg/dL Final  07/14/2013 214 (H) 0 - 200 mg/dL Final          Failed - LDL in normal range and within 360 days    Ldl Cholesterol, Calc  Date Value Ref Range Status  07/14/2013 154 (H) 0 - 100 mg/dL Final   LDL Chol Calc (NIH)  Date Value Ref Range Status  01/03/2019 147 (H) 0 - 99 mg/dL Final   LDL Cholesterol  Date Value Ref Range Status  08/31/2019 163 (H) 0 - 99 mg/dL Final    Comment:           Total Cholesterol/HDL:CHD Risk Coronary Heart Disease Risk Table                     Men   Women  1/2 Average Risk   3.4   3.3  Average Risk       5.0   4.4  2 X Average Risk   9.6   7.1  3 X Average Risk  23.4   11.0        Use the calculated Patient Ratio above and the CHD Risk Table to determine the patient's CHD Risk.        ATP III CLASSIFICATION (LDL):  <100     mg/dL   Optimal  100-129  mg/dL   Near or Above                    Optimal  130-159  mg/dL   Borderline  160-189  mg/dL   High  >190     mg/dL   Very High Performed at [facility], [address] [place], [place] [address]           Passed - HDL in normal range and within 360 days    HDL Cholesterol  Date Value Ref Range Status  07/14/2013 36 (L) 40 - 60 mg/dL  Final   HDL  Date Value Ref Range Status  08/31/2019 49 >40 mg/dL Final  01/03/2019 53 >39 mg/dL Final          Passed - Triglycerides in normal range and within 360 days    Triglycerides  Date Value Ref Range Status   08/31/2019 110 <150 mg/dL Final  07/14/2013 118 0 - 200 mg/dL Final          Passed - Patient is not pregnant      Passed - Valid encounter within last 12 months    Recent Outpatient Visits           3 weeks ago [place] [name]., MD   1 month ago Esophageal dysphagia   [facility] [name]., MD   4 months ago Type 2 diabetes mellitus with complication, without long-term current use of insulin [facility])   [facility] [name]., MD   6 months ago Type 2 diabetes mellitus with complication, without long-term current use of insulin [facility])   [facility] [name]., MD   8 months ago Gastroesophageal reflux disease, unspecified whether esophagitis present   [facility] [name]., MD       Future Appointments             In 2 months [name]., MD [facility]

## 2020-06-06 NOTE — Telephone Encounter (Signed)
-----   Message from Germaine Pomfret, [facility] sent at 06/06/2020 12:09 PM EST ----- Regarding: Medication Refills Good afternoon. I spoke with this patient today and she has been out of a couple of her medications. Can you please send in refills of the following prescriptions to [facility]?   Atorvastatin 40 mg daily  Ondansetron 8 mg ODT every 8 hours as needed (last prescribed by Dr. Rosanna Randy)  One Touch test strips - testing once daily (DX E11.8) Pantoprazole 40 mg daily (previously taking 20 mg twice daily, can we change her to once daily for convenience)   Thank you very much, [place] [phone]

## 2020-06-06 NOTE — Progress Notes (Addendum)
° ° °  Chronic Care Management Pharmacy Assistant   Name: Candace [name]  MRN: [id number] DOB: February 09, 1951   Reason for Encounter: Medication Review/Onboarding from to come [facility].     Medications: Outpatient Encounter Medications as of 06/06/2020  Medication Sig Note   acetaminophen (TYLENOL) 325 MG tablet Take 650 mg by mouth every 6 (six) hours as needed for moderate pain or headache.     atorvastatin (LIPITOR) 40 MG tablet Take 1 tablet (40 mg total) by mouth daily. (Patient not taking: Reported on 06/06/2020)    benzonatate (TESSALON) 100 MG capsule Take 1 capsule (100 mg total) by mouth 3 (three) times daily as needed for cough. (Patient not taking: Reported on 06/06/2020)    bismuth subsalicylate (PEPTO BISMOL) 262 MG chewable tablet Chew 524 mg by mouth as needed.    Dulaglutide (TRULICITY) 5.02 DX/4.1OI SOPN Inject 0.75 mg into the skin once a week.    ELIQUIS 5 MG TABS tablet Take 5 mg by mouth 2 (two) times daily. 06/06/2020: Prescribed by Dr. Nehemiah Massed   isosorbide mononitrate (IMDUR) 60 MG 24 hr tablet Take 1 tablet (60 mg total) by mouth daily. 06/06/2020: Prescribed by Dr. Nehemiah Massed    JARDIANCE 25 MG TABS tablet TAKE 1 TABLET BY MOUTH DAILY BEFORE BREAKFAST.    Lancets (ONETOUCH DELICA PLUS NOMVEH20N) MISC USE UP TO 4 TIMES DAILY AS DIRECTED    loperamide (IMODIUM) 2 MG capsule Take 2 mg by mouth as needed.    metoprolol tartrate (LOPRESSOR) 100 MG tablet Take 100 mg by mouth 2 (two) times daily. 06/06/2020: Prescribed by Dr. Nehemiah Massed    nitroGLYCERIN (NITROSTAT) 0.4 MG SL tablet Place 1 tablet (0.4 mg total) under the tongue every 5 (five) minutes as needed for chest pain.    nystatin (NYSTATIN) powder Apply 1 application topically daily. (Patient taking differently: Apply 1 application topically daily as needed.)    ondansetron (ZOFRAN-ODT) 8 MG disintegrating tablet Take 8 mg by mouth every 8 (eight) hours as needed.    pantoprazole  (PROTONIX) 20 MG tablet Take 1 tablet (20 mg total) by mouth 2 (two) times daily. (Patient not taking: Reported on 06/06/2020)    sertraline (ZOLOFT) 50 MG tablet TAKE 1 TABLET BY MOUTH EVERY DAY    sucralfate (CARAFATE) 1 g tablet Take one tablet in the morning and one tablet in the evening. (Patient not taking: Reported on 06/06/2020)    [DISCONTINUED] cetirizine (ZYRTEC) 5 MG tablet Take 2 tablets (10 mg total) by mouth daily.    No facility-administered encounter medications on file as of 06/06/2020.    Completed onboarding form and sent to clinical pharmcist for review. Reach out to cardiology to request refills for Eliquis, IMDUR and metoprolol on 06/06/2020.[facility] to [facility] to request [facility] profile transfer.  Spoke with PCP to request prescriptions for Atorvastatin, pantoprazole,test strips and ondansetron [facility] 06/10/2020.  [place] Pharmacist Assistant [phone]

## 2020-06-10 ENCOUNTER — Telehealth: Payer: Medicaid Other

## 2020-06-10 ENCOUNTER — Telehealth: Payer: Self-pay

## 2020-06-10 ENCOUNTER — Other Ambulatory Visit: Payer: Self-pay | Admitting: Family Medicine

## 2020-06-10 MED ORDER — PANTOPRAZOLE SODIUM 20 MG PO TBEC: 20.0000 mg | DELAYED_RELEASE_TABLET | Freq: Two times a day (BID) | ORAL | 11 refills | Status: DC

## 2020-06-10 NOTE — Telephone Encounter (Signed)
Requested medication (s) are due for refill today: Atorvastatin, yes  Requested medication (s) are on the active medication list: yes  Last refill:  ?  Future visit scheduled: no  Notes to clinic:  historical provider  Requested medication (s) are due for refill today: Ondansetron, yes  Requested medication (s) are on the active medication list: yes  Last refill: ?  Future visit scheduled:No  Notes to clinic: Not delegated  Requested medication (s) are due for refill today: One Touch Delica Test Strips, yes  Requested medication (s) are on the active medication list: no  Last refill:  ?  Future visit scheduled: No  Notes to clinic:  Med not on active med list       Requested Prescriptions  Pending Prescriptions Disp Refills   atorvastatin (LIPITOR) 40 MG tablet 30 tablet 1    Sig: Take 1 tablet (40 mg total) by mouth daily.      Cardiovascular:  Antilipid - Statins Failed - 06/10/2020  9:02 AM      Failed - Total Cholesterol in normal range and within 360 days    Cholesterol, Total  Date Value Ref Range Status  01/03/2019 214 (H) 100 - 199 mg/dL Final   Cholesterol  Date Value Ref Range Status  08/31/2019 234 (H) 0 - 200 mg/dL Final  07/14/2013 214 (H) 0 - 200 mg/dL Final          Failed - LDL in normal range and within 360 days    Ldl Cholesterol, Calc  Date Value Ref Range Status  07/14/2013 154 (H) 0 - 100 mg/dL Final   LDL Chol Calc (NIH)  Date Value Ref Range Status  01/03/2019 147 (H) 0 - 99 mg/dL Final   LDL Cholesterol  Date Value Ref Range Status  08/31/2019 163 (H) 0 - 99 mg/dL Final    Comment:           Total Cholesterol/HDL:CHD Risk Coronary Heart Disease Risk Table                     Men   Women  1/2 Average Risk   3.4   3.3  Average Risk       5.0   4.4  2 X Average Risk   9.6   7.1  3 X Average Risk  23.4   11.0        Use the calculated Patient Ratio above and the CHD Risk Table to determine the patient's CHD Risk.         ATP III CLASSIFICATION (LDL):  <100     mg/dL   Optimal  100-129  mg/dL   Near or Above                    Optimal  130-159  mg/dL   Borderline  160-189  mg/dL   High  >190     mg/dL   Very High Performed at [facility], [address] [place], [place] [address]           Passed - HDL in normal range and within 360 days    HDL Cholesterol  Date Value Ref Range Status  07/14/2013 36 (L) 40 - 60 mg/dL Final   HDL  Date Value Ref Range Status  08/31/2019 49 >40 mg/dL Final  01/03/2019 53 >39 mg/dL Final          Passed - Triglycerides in normal range and within 360 days  Triglycerides  Date Value Ref Range Status  08/31/2019 110 <150 mg/dL Final  07/14/2013 118 0 - 200 mg/dL Final          Passed - Patient is not pregnant      Passed - Valid encounter within last 12 months    Recent Outpatient Visits           4 weeks ago [place] [name]., MD   1 month ago Esophageal dysphagia   [facility] [name]., MD   4 months ago Type 2 diabetes mellitus with complication, without long-term current use of insulin [facility])   [facility] [name]., MD   6 months ago Type 2 diabetes mellitus with complication, without long-term current use of insulin [facility])   [facility] [name]., MD   8 months ago Gastroesophageal reflux disease, unspecified whether esophagitis present   [facility] [name]., MD       Future Appointments             In 2 months [name]., MD [facility]               ondansetron (ZOFRAN-ODT) 8 MG disintegrating tablet 20 tablet 0    Sig: Take 1 tablet (8 mg total) by mouth every 8 (eight) hours as needed.      Not Delegated - Gastroenterology: Antiemetics Failed - 06/10/2020  9:02 AM      Failed - This refill cannot be delegated      Passed - Valid  encounter within last 6 months    Recent Outpatient Visits           4 weeks ago [place] [name]., MD   1 month ago Esophageal dysphagia   [facility] [name]., MD   4 months ago Type 2 diabetes mellitus with complication, without long-term current use of insulin [facility])   [facility] [name]., MD   6 months ago Type 2 diabetes mellitus with complication, without long-term current use of insulin [facility])   [facility] [name]., MD   8 months ago Gastroesophageal reflux disease, unspecified whether esophagitis present   [facility] [name]., MD       Future Appointments             In 2 months [name]., MD [facility], [facility]              Signed Prescriptions Disp Refills   pantoprazole (PROTONIX) 20 MG tablet 60 tablet 11    Sig: Take 1 tablet (20 mg total) by mouth 2 (two) times daily.      Gastroenterology: Proton Pump Inhibitors Passed - 06/10/2020  9:02 AM      Passed - Valid encounter within last 12 months    Recent Outpatient Visits           4 weeks ago [place] [name]., MD   1 month ago Esophageal dysphagia   [facility] [name]., MD   4 months ago Type 2 diabetes mellitus with complication, without long-term current use of insulin [facility])   [facility] [name]., MD   6 months ago  Type 2 diabetes mellitus with complication, without long-term current use of insulin [facility])   [facility] [name]., MD   8 months ago Gastroesophageal reflux disease, unspecified whether esophagitis present   [facility] [name]., MD       Future Appointments             In 2 months [name]., MD [facility] [place]

## 2020-06-10 NOTE — Telephone Encounter (Signed)
Requested Prescriptions  Pending Prescriptions Disp Refills  . pantoprazole (PROTONIX) 20 MG tablet 60 tablet 11    Sig: Take 1 tablet (20 mg total) by mouth 2 (two) times daily.     Gastroenterology: Proton Pump Inhibitors Passed - 06/10/2020  9:02 AM      Passed - Valid encounter within last 12 months    Recent Outpatient Visits          4 weeks ago [place] [name]., MD   1 month ago Esophageal dysphagia   [facility] [name]., MD   4 months ago Type 2 diabetes mellitus with complication, without long-term current use of insulin [facility]   [facility] [name]., MD   6 months ago Type 2 diabetes mellitus with complication, without long-term current use of insulin [facility])   [facility] [name]., MD   8 months ago Gastroesophageal reflux disease, unspecified whether esophagitis present   [facility] [name]., MD      Future Appointments            In 2 months [name]., MD [facility], [facility]           . atorvastatin (LIPITOR) 40 MG tablet 30 tablet 1    Sig: Take 1 tablet (40 mg total) by mouth daily.     Cardiovascular:  Antilipid - Statins Failed - 06/10/2020  9:02 AM      Failed - Total Cholesterol in normal range and within 360 days    Cholesterol, Total  Date Value Ref Range Status  01/03/2019 214 (H) 100 - 199 mg/dL Final   Cholesterol  Date Value Ref Range Status  08/31/2019 234 (H) 0 - 200 mg/dL Final  07/14/2013 214 (H) 0 - 200 mg/dL Final         Failed - LDL in normal range and within 360 days    Ldl Cholesterol, Calc  Date Value Ref Range Status  07/14/2013 154 (H) 0 - 100 mg/dL Final   LDL Chol Calc (NIH)  Date Value Ref Range Status  01/03/2019 147 (H) 0 - 99 mg/dL Final   LDL Cholesterol  Date Value Ref Range Status  08/31/2019 163 (H) 0 - 99 mg/dL Final    Comment:            Total Cholesterol/HDL:CHD Risk Coronary Heart Disease Risk Table                     Men   Women  1/2 Average Risk   3.4   3.3  Average Risk       5.0   4.4  2 X Average Risk   9.6   7.1  3 X Average Risk  23.4   11.0        Use the calculated Patient Ratio above and the CHD Risk Table to determine the patient's CHD Risk.        ATP III CLASSIFICATION (LDL):  <100     mg/dL   Optimal  100-129  mg/dL   Near or Above                    Optimal  130-159  mg/dL   Borderline  160-189  mg/dL   High  >190     mg/dL   Very High Performed at [facility], [address] [place], [place] [address]          Passed - HDL in normal range and within 360 days    HDL Cholesterol  Date Value Ref Range Status  07/14/2013 36 (L) 40 - 60 mg/dL Final   HDL  Date Value Ref Range Status  08/31/2019 49 >40 mg/dL Final  01/03/2019 53 >39 mg/dL Final         Passed - Triglycerides in normal range and within 360 days    Triglycerides  Date Value Ref Range Status  08/31/2019 110 <150 mg/dL Final  07/14/2013 118 0 - 200 mg/dL Final         Passed - Patient is not pregnant      Passed - Valid encounter within last 12 months    Recent Outpatient Visits          4 weeks ago Viral URI   [facility] [name]., [facility]   1 month ago Esophageal dysphagia   [facility] [name]., MD   4 months ago Type 2 diabetes mellitus with complication, without long-term current use of insulin [facility])   [facility] [name]., MD   6 months ago Type 2 diabetes mellitus with complication, without long-term current use of insulin [facility])   [facility] [name]., MD   8 months ago Gastroesophageal reflux disease, unspecified whether esophagitis present   [facility] [name]., MD      Future Appointments            In 2 months [name]., MD  [facility], [facility]           . ondansetron (ZOFRAN-ODT) 8 MG disintegrating tablet 20 tablet 0    Sig: Take 1 tablet (8 mg total) by mouth every 8 (eight) hours as needed.     Not Delegated - Gastroenterology: Antiemetics Failed - 06/10/2020  9:02 AM      Failed - This refill cannot be delegated      Passed - Valid encounter within last 6 months    Recent Outpatient Visits          4 weeks ago [place] [name]., MD   1 month ago Esophageal dysphagia   [facility] [name]., MD   4 months ago Type 2 diabetes mellitus with complication, without long-term current use of insulin [facility])   [facility] [name]., MD   6 months ago Type 2 diabetes mellitus with complication, without long-term current use of insulin [facility])   [facility] [name]., MD   8 months ago Gastroesophageal reflux disease, unspecified whether esophagitis present   [facility] [name]., MD      Future Appointments            In 2 months [name]., MD [facility], [facility]

## 2020-06-10 NOTE — Patient Instructions (Signed)
Visit Information It was great speaking with you today!  Please let me know if you have any questions about our visit.  Goals Addressed            This Visit's Progress   . Monitor and Manage My Blood Sugar-Diabetes Type 2   On track    Timeframe:  Long-Range Goal Priority:  High Start Date: 04/15/20      Expected End Date: 08/27/20                   Follow Up Date 06/10/20   - Check blood sugar daily before breakfast - Check blood sugar if I feel it is too high or too low - Enter blood sugar readings and medication or insulin into daily log - Take the blood sugar log to all doctor visits - Take the blood sugar meter to all doctor visits       . Track and Manage Fluids and Swelling-Heart Failure   On track    Timeframe:  Long-Range Goal Priority:  High Start Date:    04/15/20                         Expected End Date: 08/27/20                  Follow Up Date 06/10/20   - Call office if I gain more than 3 pounds in one day or 5 pounds in one week - Keep legs elevated while sitting - Weigh daily and record readings - Use salt in moderation - Monitor for swelling in feet, ankles and legs every day          Patient Care Plan: General Pharmacy (Adult)    Problem Identified: Hypertension, Hyperlipidemia, Diabetes, Atrial Fibrillation, Heart Failure, Coronary Artery Disease, GERD, Asthma, Depression and Anxiety   Priority: High    Long-Range Goal: Patient-Specific Goal   Start Date: 06/06/2020  Expected End Date: 12/07/2020  This Visit's Progress: On track  Priority: High  Note:   Current Barriers:  . Unable to achieve control of diabetes  . Does not adhere to prescribed medication regimen   Pharmacist Clinical Goal(s):  Marland Kitchen Over the next 90 days, patient will achieve control of diabetes as evidenced by A1c less than 8% . adhere to prescribed medication regimen as evidenced by utilization of enhanced pharmacy services through collaboration with PharmD and provider.    Interventions: . 1:1 collaboration with [name]., MD regarding development and update of comprehensive plan of care as evidenced by provider attestation and co-signature . Inter-disciplinary care team collaboration (see longitudinal plan of care) . Comprehensive medication review performed; medication list updated in electronic medical record  Hyperlipidemia: (LDL goal < 70) -History of CABG x4 2017, 2018,  -Uncontrolled -Current treatment: . Atorvastatin 40 mg daily (ran out) -Medications previously tried: NA  - Patient reports non-adherence to her atorvastatin. She denies side effects or tolerability concerns, but would often forget to take it because she kept it separate. She currently is completely out of the medication. -Educated on Importance of limiting foods high in cholesterol; -Recommended to continue current medication  Diabetes (A1c goal <7%) -Uncontrolled -Current medications: . Trulicity 2.95 mg weekly on Tuesdays . Jardiance 25 mg daily  -Medications previously tried: Glipizide, Lantus, Tradjenta, Metformin, Metformin XR, Ozempic   -Current home glucose readings   Fasting  11-Mar 151  10-Mar 179  9-Mar 187  8-Mar 227  7-Mar 172  6-Mar 172  5-Mar 187  4-Mar 141  3-Mar 157  2-Mar 164  1-Mar 238  Average 180   -Denies hypoglycemic/hyperglycemic symptoms -Current meal patterns:  . breakfast: Often skips  . lunch: Often skips . dinner: Country style steak + gravy + mashed potatoes + broccoli + cheese sauce  . snacks: Yogurt, peanut butter crackers,  . drinks: Glucerna shakes, water, regular ginger ale  -Current exercise: walking twice daily (15 minute sessions)  -Educated on A1c and blood sugar goals; Exercise goal of 150 minutes per week; Carbohydrate counting and/or plate method -Counseled to check feet daily and get yearly eye exams -Recommended increasing Trulicity to 1.5 mg weekly  Atrial Fibrillation (Goal: prevent stroke and major  bleeding) -Controlled -CHADSVASC: 6 -Current treatment: . Rate control: Metoprolol tartrate 100 mg twice daily  . Anticoagulation: Eliquis 5 mg twice daily  -Medications previously tried: NA -Home BP and HR readings: NA  -Counseled on importance of adherence to anticoagulant exactly as prescribed; avoidance of NSAIDs due to increased bleeding risk with anticoagulants; -Recommended to continue current medication  Heart Failure (Goal: manage symptoms and prevent exacerbations) -Controlled -Last ejection fraction: 55-60% (Date: June 2021) -HF type: Diastolic -NYHA Class: III (marked limitation of activity) -AHA HF Stage: C (Heart disease and symptoms present) -Current treatment: . Imdur 60 mg daily  . Metoprolol tartrate 100 mg twice daily  -Medications previously tried: NA  - Patient reports dysnpea most often after walking. She reports one instance of chest pain while feeling ill, resolved on its own.  -Current home BP/HR readings: NA -Educated on Importance of weighing daily; if you gain more than 3 pounds in one day or 5 pounds in one week, contact provider's office -Recommended to continue current medication  Depression/Anxiety (Goal: maintain stable mood) -Controlled -Current treatment: . Sertraline 50 mg daily  -Medications previously tried/failed: NA -PHQ9: 0 -GAD7: 1 - Patient reports she manages her stress by talking with daughter, reading, or medidating  -Educated on Benefits of medication for symptom control -Recommended to continue current medication  GERD with dysphagia (Goal: prevent heartburn or reflux symptoms) -Uncontrolled -Current treatment  . None -Medications previously tried: Sucralfate, Pantoprazole  -Stopped sucralfate. Hard to swallow, feels yucky after.  - Ran out of pantoprazole, never got refilled. Felt that it helped with her symptoms -Recommended restarting pantoprazole, 40 mg daily (for patient convenience)   Patient Goals/Self-Care  Activities . Over the next 90 days, patient will:  - check glucose daily, before breakfast, document, and provide at future appointments check blood pressure weekly, document, and provide at future appointments  Follow Up Plan: Telephone follow up appointment with care management team member scheduled for: 09/05/2020 at 9:00 AM    Verbal consent obtained for [facility] enhanced pharmacy services (medication synchronization, adherence packaging, delivery coordination). A medication sync plan was created to allow patient to get all medications delivered once every 30 to 90 days per patient preference. Patient understands they have freedom to choose pharmacy and clinical pharmacist will coordinate care between all prescribers and [facility].   Ms. Arrick was given information about Chronic Care Management services today including:  1. CCM service includes personalized support from designated clinical staff supervised by her physician, including individualized plan of care and coordination with other care providers 2. 24/7 contact phone numbers for assistance for urgent and routine care needs. 3. Standard insurance, coinsurance, copays and deductibles apply for chronic care management only during months in which we provide at least 20 minutes of these  services. Most insurances cover these services at 100%, however patients may be responsible for any copay, coinsurance and/or deductible if applicable. This service may help you avoid the need for more expensive face-to-face services. 4. Only one practitioner may furnish and bill the service in a calendar month. 5. The patient may stop CCM services at any time (effective at the end of the month) by phone call to the office staff.  Patient agreed to services and verbal consent obtained.   The patient verbalized understanding of instructions, educational materials, and care plan provided today and declined offer to receive copy of patient  instructions, educational materials, and care plan.   [place] [phone]

## 2020-06-10 NOTE — Telephone Encounter (Signed)
  Chronic Care Management   Outreach Note  06/10/2020 Name: Candace [name] MRN: [id number] DOB: 07-23-1950  Primary Care Provider: [name]., MD Reason for referral : Chronic Care Management   An unsuccessful telephone outreach was attempted today. [name] is currently enrolled in the chronic care management program.  Unable to leave a voice message today due to the voice mailbox being full.    Follow Up Plan:  A member of the care management team will reach out to [name] again within the next two weeks.    [name] [phone]

## 2020-06-10 NOTE — Telephone Encounter (Signed)
Medication: atorvastatin (LIPITOR) 40 MG tablet pantoprazole (PROTONIX) 20 MG tablet test strips (ONETOUCH DELICA PLUS  ondansetron (ZOFRAN-ODT) 8 MG disintegrating tablet  Has the pt contacted their pharmacy? This is the pharmacy calling  Preferred pharmacy: [facility] - [place], [place] - [address]  Please be advised refills may take up to 3 business days.  We ask that you follow up with your pharmacy.

## 2020-06-17 ENCOUNTER — Ambulatory Visit: Payer: Self-pay | Admitting: *Deleted

## 2020-06-17 ENCOUNTER — Other Ambulatory Visit: Payer: Self-pay

## 2020-06-17 ENCOUNTER — Emergency Department: Payer: Medicare Other

## 2020-06-17 ENCOUNTER — Observation Stay
Admission: EM | Admit: 2020-06-17 | Discharge: 2020-06-18 | Disposition: A | Discharge status: Home / Self Care | Payer: Medicare Other | Attending: Internal Medicine | Admitting: Internal Medicine

## 2020-06-17 DIAGNOSIS — I25708 Atherosclerosis of coronary artery bypass graft(s), unspecified, with other forms of angina pectoris: Secondary | ICD-10-CM | POA: Diagnosis not present

## 2020-06-17 DIAGNOSIS — J449 Chronic obstructive pulmonary disease, unspecified: Secondary | ICD-10-CM | POA: Diagnosis not present

## 2020-06-17 DIAGNOSIS — I480 Paroxysmal atrial fibrillation: Secondary | ICD-10-CM

## 2020-06-17 DIAGNOSIS — E119 Type 2 diabetes mellitus without complications: Secondary | ICD-10-CM | POA: Insufficient documentation

## 2020-06-17 DIAGNOSIS — Z794 Long term (current) use of insulin: Secondary | ICD-10-CM | POA: Insufficient documentation

## 2020-06-17 DIAGNOSIS — I10 Essential (primary) hypertension: Secondary | ICD-10-CM | POA: Diagnosis present

## 2020-06-17 DIAGNOSIS — F418 Other specified anxiety disorders: Secondary | ICD-10-CM | POA: Diagnosis present

## 2020-06-17 DIAGNOSIS — E118 Type 2 diabetes mellitus with unspecified complications: Secondary | ICD-10-CM | POA: Diagnosis present

## 2020-06-17 DIAGNOSIS — J45909 Unspecified asthma, uncomplicated: Secondary | ICD-10-CM | POA: Diagnosis not present

## 2020-06-17 DIAGNOSIS — Z79899 Other long term (current) drug therapy: Secondary | ICD-10-CM | POA: Diagnosis not present

## 2020-06-17 DIAGNOSIS — E1142 Type 2 diabetes mellitus with diabetic polyneuropathy: Secondary | ICD-10-CM | POA: Diagnosis present

## 2020-06-17 DIAGNOSIS — Z8673 Personal history of transient ischemic attack (TIA), and cerebral infarction without residual deficits: Secondary | ICD-10-CM | POA: Diagnosis not present

## 2020-06-17 DIAGNOSIS — I110 Hypertensive heart disease with heart failure: Secondary | ICD-10-CM | POA: Diagnosis not present

## 2020-06-17 DIAGNOSIS — Z87891 Personal history of nicotine dependence: Secondary | ICD-10-CM | POA: Diagnosis not present

## 2020-06-17 DIAGNOSIS — I2510 Atherosclerotic heart disease of native coronary artery without angina pectoris: Secondary | ICD-10-CM | POA: Diagnosis present

## 2020-06-17 DIAGNOSIS — R079 Chest pain, unspecified: Secondary | ICD-10-CM | POA: Diagnosis present

## 2020-06-17 DIAGNOSIS — Z7901 Long term (current) use of anticoagulants: Secondary | ICD-10-CM | POA: Insufficient documentation

## 2020-06-17 DIAGNOSIS — Z20822 Contact with and (suspected) exposure to covid-19: Secondary | ICD-10-CM | POA: Diagnosis not present

## 2020-06-17 DIAGNOSIS — R778 Other specified abnormalities of plasma proteins: Secondary | ICD-10-CM

## 2020-06-17 DIAGNOSIS — I5032 Chronic diastolic (congestive) heart failure: Secondary | ICD-10-CM | POA: Diagnosis not present

## 2020-06-17 LAB — HIV ANTIBODY (ROUTINE TESTING W REFLEX): HIV Screen 4th Generation wRfx: NONREACTIVE

## 2020-06-17 LAB — BASIC METABOLIC PANEL
Anion gap: 7 (ref 5–15)
BUN: 11 mg/dL (ref 8–23)
CO2: 24 mmol/L (ref 22–32)
Calcium: 9.3 mg/dL (ref 8.9–10.3)
Chloride: 109 mmol/L (ref 98–111)
Creatinine, Ser: 0.64 mg/dL (ref 0.44–1.00)
GFR, Estimated: >60 mL/min (ref >60)
Glucose, Bld: 183 mg/dL — ABNORMAL HIGH (ref 70–99)
Potassium: 4.3 mmol/L (ref 3.5–5.1)
Sodium: 140 mmol/L (ref 135–145)

## 2020-06-17 LAB — CBC
HCT: 42 % (ref 36.0–46.0)
Hemoglobin: 13.6 g/dL (ref 12.0–15.0)
MCH: 31.6 pg (ref 26.0–34.0)
MCHC: 32.4 g/dL (ref 30.0–36.0)
MCV: 97.7 fL (ref 80.0–100.0)
Platelets: 211 10*3/uL (ref 150–400)
RBC: 4.3 MIL/uL (ref 3.87–5.11)
RDW: 12.8 % (ref 11.5–15.5)
WBC: 6.9 10*3/uL (ref 4.0–10.5)
nRBC: 0 % (ref 0.0–0.2)

## 2020-06-17 LAB — GLUCOSE, CAPILLARY: Glucose-Capillary: 193 mg/dL — ABNORMAL HIGH (ref 70–99)

## 2020-06-17 LAB — TROPONIN I (HIGH SENSITIVITY)
Troponin I (High Sensitivity): 253 ng/L (ref <18)
Troponin I (High Sensitivity): 342 ng/L (ref <18)
Troponin I (High Sensitivity): 387 ng/L (ref <18)
Troponin I (High Sensitivity): 438 ng/L (ref <18)
Troponin I (High Sensitivity): 344 ng/L (ref <18)

## 2020-06-17 LAB — SARS CORONAVIRUS 2 (TAT 6-24 HRS): SARS Coronavirus 2: NEGATIVE

## 2020-06-17 LAB — CBG MONITORING, ED
Glucose-Capillary: 136 mg/dL — ABNORMAL HIGH (ref 70–99)
Glucose-Capillary: 132 mg/dL — ABNORMAL HIGH (ref 70–99)

## 2020-06-17 MED ORDER — NITROGLYCERIN 0.4 MG SL SUBL: 0.4000 mg | SUBLINGUAL_TABLET | SUBLINGUAL | Status: DC | PRN

## 2020-06-17 MED ORDER — ONDANSETRON HCL 4 MG/2ML IJ SOLN
4.0000 mg | Freq: Four times a day (QID) | INTRAMUSCULAR | Status: DC | PRN
  Administered 2020-06-17 – 2020-06-18 (×4): 4 mg via INTRAVENOUS
  Filled 2020-06-17 (×4): qty 2

## 2020-06-17 MED ORDER — METOPROLOL TARTRATE 50 MG PO TABS
100.0000 mg | ORAL_TABLET | Freq: Two times a day (BID) | ORAL | Status: DC
  Administered 2020-06-18: 100 mg via ORAL
  Filled 2020-06-17: qty 2

## 2020-06-17 MED ORDER — ACETAMINOPHEN 325 MG PO TABS
650.0000 mg | ORAL_TABLET | ORAL | Status: DC | PRN
  Administered 2020-06-18: 650 mg via ORAL
  Filled 2020-06-17: qty 2

## 2020-06-17 MED ORDER — ISOSORBIDE MONONITRATE ER 60 MG PO TB24
60.0000 mg | ORAL_TABLET | Freq: Every day | ORAL | Status: DC
  Administered 2020-06-18: 60 mg via ORAL
  Filled 2020-06-17: qty 1

## 2020-06-17 MED ORDER — INSULIN ASPART 100 UNIT/ML ~~LOC~~ SOLN
0.0000 [IU] | Freq: Three times a day (TID) | SUBCUTANEOUS | Status: DC
  Administered 2020-06-17 – 2020-06-18 (×4): 2 [IU] via SUBCUTANEOUS
  Filled 2020-06-17 (×4): qty 1

## 2020-06-17 MED ORDER — ASPIRIN 300 MG RE SUPP: 300.0000 mg | RECTAL | Status: DC

## 2020-06-17 MED ORDER — ATORVASTATIN CALCIUM 20 MG PO TABS
40.0000 mg | ORAL_TABLET | Freq: Every day | ORAL | Status: DC
  Administered 2020-06-17 – 2020-06-18 (×2): 40 mg via ORAL
  Filled 2020-06-17 (×2): qty 2

## 2020-06-17 MED ORDER — ASPIRIN EC 81 MG PO TBEC
81.0000 mg | DELAYED_RELEASE_TABLET | Freq: Every day | ORAL | Status: DC
  Administered 2020-06-18: 81 mg via ORAL
  Filled 2020-06-17: qty 1

## 2020-06-17 MED ORDER — NITROGLYCERIN 0.4 MG SL SUBL
0.4000 mg | SUBLINGUAL_TABLET | SUBLINGUAL | Status: DC | PRN
  Administered 2020-06-17: 0.4 mg via SUBLINGUAL
  Filled 2020-06-17: qty 1

## 2020-06-17 MED ORDER — APIXABAN 5 MG PO TABS
5.0000 mg | ORAL_TABLET | Freq: Two times a day (BID) | ORAL | Status: DC
  Administered 2020-06-17 – 2020-06-18 (×2): 5 mg via ORAL
  Filled 2020-06-17 (×2): qty 1

## 2020-06-17 MED ORDER — ASPIRIN 81 MG PO CHEW
324.0000 mg | CHEWABLE_TABLET | Freq: Once | ORAL | Status: AC
  Administered 2020-06-17: 324 mg via ORAL
  Filled 2020-06-17: qty 4

## 2020-06-17 MED ORDER — SERTRALINE HCL 50 MG PO TABS
50.0000 mg | ORAL_TABLET | Freq: Every day | ORAL | Status: DC
  Administered 2020-06-18: 50 mg via ORAL
  Filled 2020-06-17: qty 1

## 2020-06-17 MED ORDER — SUCRALFATE 1 G PO TABS
1.0000 g | ORAL_TABLET | Freq: Three times a day (TID) | ORAL | Status: DC
  Filled 2020-06-17: qty 1

## 2020-06-17 MED ORDER — METHYLPREDNISOLONE SODIUM SUCC 40 MG IJ SOLR
40.0000 mg | Freq: Once | INTRAMUSCULAR | Status: AC
  Administered 2020-06-17: 40 mg via INTRAVENOUS
  Filled 2020-06-17: qty 1

## 2020-06-17 MED ORDER — ASPIRIN 81 MG PO CHEW: 324.0000 mg | CHEWABLE_TABLET | ORAL | Status: DC

## 2020-06-17 MED ORDER — MORPHINE SULFATE (PF) 2 MG/ML IV SOLN
2.0000 mg | INTRAVENOUS | Status: DC | PRN
Start: 2020-06-17 — End: 2020-06-18
  Administered 2020-06-17 – 2020-06-18 (×6): 2 mg via INTRAVENOUS
  Filled 2020-06-17 (×6): qty 1

## 2020-06-17 MED ORDER — PANTOPRAZOLE SODIUM 20 MG PO TBEC
20.0000 mg | DELAYED_RELEASE_TABLET | Freq: Two times a day (BID) | ORAL | Status: DC
  Administered 2020-06-18: 20 mg via ORAL
  Filled 2020-06-17 (×2): qty 1

## 2020-06-17 MED ORDER — ACETAMINOPHEN 325 MG PO TABS: 650.0000 mg | ORAL_TABLET | Freq: Four times a day (QID) | ORAL | Status: DC | PRN

## 2020-06-17 NOTE — Consult Note (Signed)
Dr. Nehemiah Massed Cardiology Consultation Note    Patient ID: Candace [name], MRN: [id number], DOB/AGE: [birth year] 70 y.o. Admit date: 06/17/2020   Date of Consult: 06/17/2020 Primary Physician: [name]., MD Primary Cardiologist: [facility]  Chief Complaint: chest pain Reason for Consultation: chest pain Requesting MD: Dr. Francine Graven  HPI: Candace [name] is a 70 y.o. female with history of history of coronary artery disease status post coronary artery bypass grafting at [facility] in 2017 with a LIMA to the LAD, saphenous vein graft to the PDA, OM1, OM 2 and with a subsequent stent in the OM 2 in 2018.  He had a p.o. BA to a small vessel and February 2019, bilateral carotid disease, hypertension, paroxysmal A. fib, chronic diastolic dysfunction who presented to the emergency room with complaints of chest pain that started 1 day prior to presentation.  She states that she was fine until she received her shingles vaccine.  24 hours later developed left anterior chest pain described as stabbing with radiation to her arm.  It was a pulsatile pain and not squeezing sensation.  Somewhat different than her angina.  Pain was not exertional.  She presented to the emergency room where serum troponin was 253.  EKG showed sinus rhythm with left bundle branch block.  Left bundle was chronic.  Chest x-ray showed prior CABG with no congestive heart failure.  Most recent cath was in June of last year revealing three-vessel coronary disease with a patent LIMA to the LAD, patent saphenous vein graft to PDA with a stent in the saphenous vein graft to the OM.  Medical management was recommended.  She is currently pain-free and hemodynamically stable.  Pain is different than her angina.  Past Medical History:  Diagnosis Date  . Allergy   . Anemia   . Anxiety   . Arrhythmia   . Arthritis   . Atrial fibrillation ([place])   . Coronary artery disease   . Depression   . Diabetes mellitus without complication  ([place])   . Dyspnea    doe  . Dysrhythmia   . GERD (gastroesophageal reflux disease)   . Headache   . History of hiatal hernia   . Hyperlipidemia   . Hypertension   . Myocardial infarction ([place])    2016, 04/2017  . Myocardial infarction with cardiac rehabilitation [facility])    MI 2016/ CABG 8/17    FINISHED CARDIAC REHAB 3 WEEKS AGO  . Panic attack   . Pneumonia   . Reflux   . Stroke [facility]) 2015   showed up on MRI; no weakness noted  . TIA (transient ischemic attack)   . Voice tremor       Surgical History:  Past Surgical History:  Procedure Laterality Date  . ABDOMINAL HYSTERECTOMY    . APPENDECTOMY  1975  . ARTERY BIOPSY Right 04/26/2016   Procedure: BIOPSY TEMPORAL ARTERY;  Surgeon: Margaretha Sheffield, MD;  Location: [facility] ORS;  Service: ENT;  Laterality: Right;  . CARDIAC CATHETERIZATION N/A 11/06/2015   Procedure: Left Heart Cath and Coronary Angiography;  Surgeon: Corey Skains, MD;  Location: [place] CV LAB;  Service: Cardiovascular;  Laterality: N/A;  . CESAREAN SECTION    . COLONOSCOPY  2015  . COLONOSCOPY WITH PROPOFOL N/A 11/03/2018   Procedure: COLONOSCOPY WITH PROPOFOL;  Surgeon: Virgel Manifold, MD;  Location: [facility] ENDOSCOPY;  Service: Endoscopy;  Laterality: N/A;  . CORONARY ANGIOPLASTY  04/2017   [place]  . CORONARY ARTERY BYPASS  GRAFT N/A 11/10/2015   Procedure: CORONARY ARTERY BYPASS GRAFTING (CABG), ON PUMP, TIMES FOUR, USING LEFT INTERNAL MAMMARY ARTERY, BILATERAL GREATER SAPHENOUS VEINS HARVESTED ENDOSCOPICALLY;  Surgeon: [name], MD;  Location: [place];  Service: Open Heart Surgery;  Laterality: N/A;  LIMA-LAD; SEQ SVG-OM1-OM2; SVG-PL  . CORONARY STENT INTERVENTION N/A 08/05/2016   Procedure: Coronary Stent Intervention;  Surgeon: Isaias Cowman, MD;  Location: [place] CV LAB;  Service: Cardiovascular;  Laterality: N/A;  . ESOPHAGOGASTRODUODENOSCOPY (EGD) WITH PROPOFOL N/A 11/03/2018   Procedure: ESOPHAGOGASTRODUODENOSCOPY  (EGD) WITH PROPOFOL;  Surgeon: Virgel Manifold, MD;  Location: [facility] ENDOSCOPY;  Service: Endoscopy;  Laterality: N/A;  . HYSTERECTOMY ABDOMINAL WITH SALPINGO-OOPHORECTOMY Bilateral 08/15/2017   Procedure: HYSTERECTOMY ABDOMINAL WITH BILATERAL SALPINGO-OOPHORECTOMY;  Surgeon: Rubie Maid, MD;  Location: [facility] ORS;  Service: Gynecology;  Laterality: Bilateral;  . LEFT HEART CATH AND CORONARY ANGIOGRAPHY N/A 08/05/2016   Procedure: Left Heart Cath and Coronary Angiography;  Surgeon: Isaias Cowman, MD;  Location: [place] CV LAB;  Service: Cardiovascular;  Laterality: N/A;  . LEFT HEART CATH AND CORS/GRAFTS ANGIOGRAPHY N/A 09/19/2018   Procedure: LEFT HEART CATH AND CORS/GRAFTS ANGIOGRAPHY;  Surgeon: Corey Skains, MD;  Location: [place] CV LAB;  Service: Cardiovascular;  Laterality: N/A;  . LEFT HEART CATH AND CORS/GRAFTS ANGIOGRAPHY N/A 08/30/2019   Procedure: LEFT HEART CATH AND CORS/GRAFTS ANGIOGRAPHY;  Surgeon: Teodoro Spray, MD;  Location: [place] CV LAB;  Service: Cardiovascular;  Laterality: N/A;  . OOPHORECTOMY    . TEE WITHOUT CARDIOVERSION N/A 11/10/2015   Procedure: TRANSESOPHAGEAL ECHOCARDIOGRAM (TEE);  Surgeon: [name], MD;  Location: [place];  Service: Open Heart Surgery;  Laterality: N/A;  . TUBAL LIGATION       Home Meds: Prior to Admission medications   Medication Sig Start Date End Date Taking? Authorizing Provider  ELIQUIS 5 MG TABS tablet Take 5 mg by mouth 2 (two) times daily. 03/26/20  Yes [provider]  isosorbide mononitrate (IMDUR) 60 MG 24 hr tablet Take 1 tablet (60 mg total) by mouth daily. 03/08/19  Yes Wieting, Richard, MD  JARDIANCE 25 MG TABS tablet TAKE 1 TABLET BY MOUTH DAILY BEFORE BREAKFAST. Patient taking differently: Take 25 mg by mouth daily. 06/04/20  Yes [name]., MD  metoprolol tartrate (LOPRESSOR) 100 MG tablet Take 100 mg by mouth 2 (two) times daily. 10/03/18  Yes [provider]   pantoprazole (PROTONIX) 20 MG tablet Take 1 tablet (20 mg total) by mouth 2 (two) times daily. 06/10/20  Yes [name]., MD  sertraline (ZOLOFT) 50 MG tablet TAKE 1 TABLET BY MOUTH EVERY DAY Patient taking differently: Take 50 mg by mouth daily. 12/30/19  Yes [name]., MD  acetaminophen (TYLENOL) 325 MG tablet Take 650 mg by mouth every 6 (six) hours as needed for moderate pain or headache.     [provider]  benzonatate (TESSALON) 100 MG capsule Take 1 capsule (100 mg total) by mouth 3 (three) times daily as needed for cough. Patient not taking: No sig reported 05/13/20   [name]., MD  bismuth subsalicylate (PEPTO BISMOL) 262 MG chewable tablet Chew 524 mg by mouth as needed.    [provider]  Dulaglutide (TRULICITY) 1.60 FU/9.3AT SOPN Inject 0.75 mg into the skin once a week. 05/19/20   [name]., MD  Lancets [facility] DELICA PLUS [id number]) [place] USE UP TO 4 TIMES DAILY AS DIRECTED 07/10/19   Miguel Aschoff  Kaylyn Lim., MD  loperamide (IMODIUM) 2 MG capsule Take 2 mg by mouth as needed.    [provider]  nitroGLYCERIN (NITROSTAT) 0.4 MG SL tablet Place 1 tablet (0.4 mg total) under the tongue every 5 (five) minutes as needed for chest pain. 07/27/16   Bettey Costa, MD  nystatin (NYSTATIN) powder Apply 1 application topically daily. Patient taking differently: Apply 1 application topically daily as needed. 02/14/20   Rubie Maid, MD  ondansetron (ZOFRAN-ODT) 8 MG disintegrating tablet Take 8 mg by mouth every 8 (eight) hours as needed. 02/12/20   [provider]  sucralfate (CARAFATE) 1 g tablet Take one tablet in the morning and one tablet in the evening. Patient not taking: No sig reported 05/01/20   [name]., MD  cetirizine (ZYRTEC) 5 MG tablet Take 2 tablets (10 mg total) by mouth daily. 12/19/18 02/06/20  [name]., MD    Inpatient Medications:  . apixaban  5 mg Oral BID  . [START  ON 06/18/2020] aspirin EC  81 mg Oral Daily  . atorvastatin  40 mg Oral Daily  . insulin aspart  0-15 Units Subcutaneous TID WC  . [START ON 06/18/2020] isosorbide mononitrate  60 mg Oral Daily  . methylPREDNISolone (SOLU-MEDROL) injection  40 mg Intravenous Once  . [START ON 06/18/2020] metoprolol tartrate  100 mg Oral BID  . [START ON 06/18/2020] pantoprazole  20 mg Oral BID  . [START ON 06/18/2020] sertraline  50 mg Oral Daily  . sucralfate  1 g Oral TID WC & HS     Allergies:  Allergies  Allergen Reactions  . Lisinopril Cough  . Penicillins Swelling, Rash and Other (See Comments)    Did it involve swelling of the face/tongue/throat, SOB, or low BP? Yes Did it involve sudden or severe rash/hives, skin peeling, or any reaction on the inside of your mouth or nose? Yes Did you need to seek medical attention at a hospital or doctor's office? Yes When did it last happen?15 years If all above answers are "NO", may proceed with cephalosporin use.     Social History   Socioeconomic History  . Marital status: Divorced    Spouse name: Not on file  . Number of children: 3  . Years of education: Not on file  . Highest education level: Some college, no degree  Occupational History  . Occupation: retired  Tobacco Use  . Smoking status: Former Smoker    Packs/day: 0.50    Types: Cigarettes    Quit date: 10/07/2001    Years since quitting: 18.7  . Smokeless tobacco: Never Used  Vaping Use  . Vaping Use: Never used  Substance and Sexual Activity  . Alcohol use: No    Alcohol/week: 0.0 standard drinks  . Drug use: No  . Sexual activity: Not Currently  Other Topics Concern  . Not on file  Social History Narrative   Lives at home alone   Social Determinants of Health   Financial Resource Strain: Low Risk   . Difficulty of Paying Living Expenses: Not hard at all  Food Insecurity: No Food Insecurity  . Worried About Charity fundraiser in the Last Year: Never true  . Ran Out  of Food in the Last Year: Never true  Transportation Needs: No Transportation Needs  . Lack of Transportation (Medical): No  . Lack of Transportation (Non-Medical): No  Physical Activity: Inactive  . Days of Exercise per Week: 0 days  . Minutes of  Exercise per Session: 0 min  Stress: No Stress Concern Present  . Feeling of Stress : Not at all  Social Connections: Moderately Isolated  . Frequency of Communication with Friends and Family: More than three times a week  . Frequency of Social Gatherings with Friends and Family: More than three times a week  . Attends Religious Services: More than 4 times per year  . Active Member of Clubs or Organizations: No  . Attends Archivist Meetings: Never  . Marital Status: Divorced  Human resources officer Violence: Not At Risk  . Fear of Current or Ex-Partner: No  . Emotionally Abused: No  . Physically Abused: No  . Sexually Abused: No     Family History  Problem Relation Age of Onset  . Cancer Father   . Hypertension Father   . Heart disease Father   . Asthma Father   . Cancer Mother   . Hypertension Mother   . Pancreatic cancer Mother 27  . Cancer Sister   . Breast cancer Sister 46  . Breast cancer Sister 109  . Lung cancer Brother   . Pancreatic cancer Sister 75  . Cancer Sister      Review of Systems: A 12-system review of systems was performed and is negative except as noted in the HPI.  Labs: No results for input(s): CKTOTAL, CKMB, TROPONINI in the last 72 hours. Lab Results  Component Value Date   WBC 6.9 06/17/2020   HGB 13.6 06/17/2020   HCT 42.0 06/17/2020   MCV 97.7 06/17/2020   PLT 211 06/17/2020    Recent Labs  Lab 06/17/20 0920  NA 140  K 4.3  CL 109  CO2 24  BUN 11  CREATININE 0.64  CALCIUM 9.3  GLUCOSE 183*   Lab Results  Component Value Date   CHOL 234 (H) 08/31/2019   HDL 49 08/31/2019   LDLCALC 163 (H) 08/31/2019   TRIG 110 08/31/2019   No results found for: DDIMER  Radiology/Studies:   DG Chest 2 View  Result Date: 06/17/2020 CLINICAL DATA:  Chest pain. EXAM: CHEST - 2 VIEW COMPARISON:  08/29/2019.  CT 12/07/2018. FINDINGS: Mediastinum hilar structures are unremarkable. Prior CABG. Heart size normal. No focal infiltrate. Tiny right pleural effusion versus pleural scarring. No pneumothorax. Stable soft tissue calcification noted about the left shoulder. Degenerative changes both shoulders and thoracic spine. Mild thoracic spine scoliosis. IMPRESSION: 1.  Prior CABG.  Heart size stable. 2. Tiny right pleural effusion versus pleural scarring. No acute infiltrates. Electronically Signed   By: Marcello Moores  Register   On: 06/17/2020 09:41    Wt Readings from Last 3 Encounters:  05/13/20 76.7 kg  05/01/20 76.7 kg  02/14/20 75.8 kg    EKG: Sinus rhythm with chronic left bundle branch block  Physical Exam:  Blood pressure (!) 143/76, pulse 64, temperature 98.1 F (36.7 C), temperature source Oral, resp. rate 16, SpO2 95 %. There is no height or weight on file to calculate BMI. General: Well developed, well nourished, in no acute distress. Head: Normocephalic, atraumatic, sclera non-icteric, no xanthomas, nares are without discharge.  Neck: Negative for carotid bruits. JVD not elevated. Lungs: Clear bilaterally to auscultation without wheezes, rales, or rhonchi. Breathing is unlabored. Heart: RRR with S1 S2. No murmurs, rubs, or gallops appreciated. Abdomen: Soft, non-tender, non-distended with normoactive bowel sounds. No hepatomegaly. No rebound/guarding. No obvious abdominal masses. Msk:  Strength and tone appear normal for age. Extremities: No clubbing or cyanosis. No edema.  Distal pedal  pulses are 2+ and equal bilaterally. Neuro: Alert and oriented X 3. No facial asymmetry. No focal deficit. Moves all extremities spontaneously. Psych:  Responds to questions appropriately with a normal affect.     Assessment and Plan  70 year old female status post coronary bypass grafting  who developed chest pain somewhat atypical for angina which occurred 24 hours after getting a shingles vaccine.  She presented to the emergency room her EKG was unchanged.  She was noted to have elevated serum troponins which were relatively flat at 253-344.  1.  Chest pain-appears to be secondary to her shingles vaccine.  Does not appear to be an acute coronary event.  Pain is somewhat atypical for angina.  Occurred after getting her shingles vaccine.  Currently hemodynamically stable.  Not a candidate for invasive evaluation at present.  Cath less than 1 year ago showed patent graft and stent.  Pain is currently stable.  Would continue with Imdur 60 mg daily, metoprolol tartrate 100 twice daily and atorvastatin.  Would also continue with aspirin.  2.  Hyperlipidemia-continue with atorvastatin.  Low-fat diet.  Signed, Teodoro Spray MD 06/17/2020, 3:32 PM Pager: [phone]

## 2020-06-17 NOTE — ED Notes (Signed)
Pt requests to try PRN morphine for CP.

## 2020-06-17 NOTE — ED Provider Notes (Signed)
[facility] Emergency Department Provider Note  Time seen: 10:23 AM  I have reviewed the triage vital signs and the nursing notes.   HISTORY  Chief Complaint Chest Pain   HPI Candace [name] is a 70 y.o. female with a past medical history of anxiety, CAD, diabetes, hypertension, hyperlipidemia, prior MI in 2016, CABG in 2017, presents to the emergency department for chest pain.  According to the patient she got her shingles shot on Friday and starting Saturday she has been experiencing chest discomfort in the left chest radiating to the left arm.  Denies any shortness of breath nausea or diaphoresis.  Patient called her doctor who states that the pain was likely related to the shingles injection but wanted her to come to the emergency department as a precaution.  Patient states the pain mostly occurs when she is lying back, but does state very mild discomfort currently.  Past Medical History:  Diagnosis Date  . Allergy   . Anemia   . Anxiety   . Arrhythmia   . Arthritis   . Atrial fibrillation ([place])   . Coronary artery disease   . Depression   . Diabetes mellitus without complication ([place])   . Dyspnea    doe  . Dysrhythmia   . GERD (gastroesophageal reflux disease)   . Headache   . History of hiatal hernia   . Hyperlipidemia   . Hypertension   . Myocardial infarction ([place])    2016, 04/2017  . Myocardial infarction with cardiac rehabilitation [facility])    MI 2016/ CABG 8/17    FINISHED CARDIAC REHAB 3 WEEKS AGO  . Panic attack   . Pneumonia   . Reflux   . Stroke [facility]) 2015   showed up on MRI; no weakness noted  . TIA (transient ischemic attack)   . Voice tremor     Patient Active Problem List   Diagnosis Date Noted  . Vertigo   . Unsteady gait   . Acute bronchitis 03/07/2019  . Esophageal dysphagia   . Stomach irritation   . Gastric polyp   . Esophageal lump   . Hx of colonic polyp   . Polyp of colon   . Diverticulosis of large intestine  without diverticulitis   . PSVT (paroxysmal supraventricular tachycardia) ([place]) 10/03/2018  . Abnormal ECG 07/05/2018  . Tachycardia 03/27/2018  . Pain in limb 02/28/2018  . Chronic diastolic CHF (congestive heart failure) ([place]) 11/03/2017  . TIA (transient ischemic attack) 11/03/2017  . COPD suggested by initial evaluation ([place]) 08/23/2017  . Acute and chronic respiratory failure with hypoxia ([place]) 08/23/2017  . Postoperative state 08/15/2017  . Incidental pulmonary nodule 08/01/2017  . Non-ST elevation myocardial infarction (NSTEMI), subendocardial infarction, subsequent episode of care ([place]) 06/01/2017  . B12 deficiency 12/01/2016  . Vitamin D deficiency 11/04/2016  . Depression with anxiety 09/30/2016  . Paroxysmal A-fib ([place]) 09/30/2016  . Resting tremor 09/30/2016  . Mild obstructive sleep apnea 09/30/2016  . Headache 08/18/2016  . Unstable angina ([place]) 08/03/2016  . Type 2 diabetes mellitus with complication, without long-term current use of insulin ([place]) 08/03/2016  . Chronic daily headache 05/05/2016  . Asthma 11/11/2015  . S/P CABG x 4 11/10/2015  . Coronary artery disease 11/07/2015  . Carotid artery narrowing 02/08/2014  . Chest pain 02/08/2014  . Mixed hyperlipidemia 02/08/2014  . Bilateral carotid artery stenosis 02/08/2014  . Atypical chest pain 02/07/2014  . Essential hypertension 02/07/2014  . Awareness of heartbeats 02/07/2014  . D (  diarrhea) 10/05/2013  . Gastroesophageal reflux disease 10/05/2013    Past Surgical History:  Procedure Laterality Date  . ABDOMINAL HYSTERECTOMY    . APPENDECTOMY  1975  . ARTERY BIOPSY Right 04/26/2016   Procedure: BIOPSY TEMPORAL ARTERY;  Surgeon: Margaretha Sheffield, MD;  Location: [facility] ORS;  Service: ENT;  Laterality: Right;  . CARDIAC CATHETERIZATION N/A 11/06/2015   Procedure: Left Heart Cath and Coronary Angiography;  Surgeon: Corey Skains, MD;  Location: [place] CV [facility];  Service: Cardiovascular;  Laterality: N/A;  .  CESAREAN SECTION    . COLONOSCOPY  2015  . COLONOSCOPY WITH PROPOFOL N/A 11/03/2018   Procedure: COLONOSCOPY WITH PROPOFOL;  Surgeon: Virgel Manifold, MD;  Location: [facility] ENDOSCOPY;  Service: Endoscopy;  Laterality: N/A;  . CORONARY ANGIOPLASTY  04/2017   [place]  . CORONARY ARTERY BYPASS GRAFT N/A 11/10/2015   Procedure: CORONARY ARTERY BYPASS GRAFTING (CABG), ON PUMP, TIMES FOUR, USING LEFT INTERNAL MAMMARY ARTERY, BILATERAL GREATER SAPHENOUS VEINS HARVESTED ENDOSCOPICALLY;  Surgeon: [name], MD;  Location: [place];  Service: Open Heart Surgery;  Laterality: N/A;  LIMA-LAD; SEQ SVG-OM1-OM2; SVG-PL  . CORONARY STENT INTERVENTION N/A 08/05/2016   Procedure: Coronary Stent Intervention;  Surgeon: Isaias Cowman, MD;  Location: [place] [facility];  Service: Cardiovascular;  Laterality: N/A;  . ESOPHAGOGASTRODUODENOSCOPY (EGD) WITH PROPOFOL N/A 11/03/2018   Procedure: ESOPHAGOGASTRODUODENOSCOPY (EGD) WITH PROPOFOL;  Surgeon: Virgel Manifold, MD;  Location: [facility] ENDOSCOPY;  Service: Endoscopy;  Laterality: N/A;  . HYSTERECTOMY ABDOMINAL WITH SALPINGO-OOPHORECTOMY Bilateral 08/15/2017   Procedure: HYSTERECTOMY ABDOMINAL WITH BILATERAL SALPINGO-OOPHORECTOMY;  Surgeon: Rubie Maid, MD;  Location: [facility] ORS;  Service: Gynecology;  Laterality: Bilateral;  . LEFT HEART CATH AND CORONARY ANGIOGRAPHY N/A 08/05/2016   Procedure: Left Heart Cath and Coronary Angiography;  Surgeon: Isaias Cowman, MD;  Location: [place] CV [facility];  Service: Cardiovascular;  Laterality: N/A;  . LEFT HEART CATH AND CORS/GRAFTS ANGIOGRAPHY N/A 09/19/2018   Procedure: LEFT HEART CATH AND CORS/GRAFTS ANGIOGRAPHY;  Surgeon: Corey Skains, MD;  Location: [place] [facility];  Service: Cardiovascular;  Laterality: N/A;  . LEFT HEART CATH AND CORS/GRAFTS ANGIOGRAPHY N/A 08/30/2019   Procedure: LEFT HEART CATH AND CORS/GRAFTS ANGIOGRAPHY;  Surgeon: Teodoro Spray, MD;  Location: [place]  CV LAB;  Service: Cardiovascular;  Laterality: N/A;  . OOPHORECTOMY    . TEE WITHOUT CARDIOVERSION N/A 11/10/2015   Procedure: TRANSESOPHAGEAL ECHOCARDIOGRAM (TEE);  Surgeon: [name], MD;  Location: [place];  Service: Open Heart Surgery;  Laterality: N/A;  . TUBAL LIGATION      Prior to Admission medications   Medication Sig Start Date End Date Taking? Authorizing Provider  acetaminophen (TYLENOL) 325 MG tablet Take 650 mg by mouth every 6 (six) hours as needed for moderate pain or headache.     [provider]  atorvastatin (LIPITOR) 40 MG tablet Take 1 tablet (40 mg total) by mouth daily. Patient not taking: Reported on 06/06/2020 08/31/19   Fritzi Mandes, MD  benzonatate (TESSALON) 100 MG capsule Take 1 capsule (100 mg total) by mouth 3 (three) times daily as needed for cough. Patient not taking: Reported on 06/06/2020 05/13/20   [name]., MD  bismuth subsalicylate (PEPTO BISMOL) 262 MG chewable tablet Chew 524 mg by mouth as needed.    [provider]  Dulaglutide (TRULICITY) 8.25 KN/3.9JQ SOPN Inject 0.75 mg into the skin once a week. 05/19/20   [name]., MD  ELIQUIS 5 MG TABS  tablet Take 5 mg by mouth 2 (two) times daily. 03/26/20   [provider]  isosorbide mononitrate (IMDUR) 60 MG 24 hr tablet Take 1 tablet (60 mg total) by mouth daily. 03/08/19   Loletha Grayer, MD  JARDIANCE 25 MG TABS tablet TAKE 1 TABLET BY MOUTH DAILY BEFORE BREAKFAST. 06/04/20   [name]., MD  Lancets [facility] DELICA PLUS [id number]) MISC USE UP TO 4 TIMES DAILY AS DIRECTED 07/10/19   [name]., MD  loperamide (IMODIUM) 2 MG capsule Take 2 mg by mouth as needed.    [provider]  metoprolol tartrate (LOPRESSOR) 100 MG tablet Take 100 mg by mouth 2 (two) times daily. 10/03/18   [provider]  nitroGLYCERIN (NITROSTAT) 0.4 MG SL tablet Place 1 tablet (0.4 mg total) under the tongue every 5 (five) minutes as needed  for chest pain. 07/27/16   Bettey Costa, MD  nystatin (NYSTATIN) powder Apply 1 application topically daily. Patient taking differently: Apply 1 application topically daily as needed. 02/14/20   Rubie Maid, MD  ondansetron (ZOFRAN-ODT) 8 MG disintegrating tablet Take 8 mg by mouth every 8 (eight) hours as needed. 02/12/20   [provider]  pantoprazole (PROTONIX) 20 MG tablet Take 1 tablet (20 mg total) by mouth 2 (two) times daily. 06/10/20   [name]., MD  sertraline (ZOLOFT) 50 MG tablet TAKE 1 TABLET BY MOUTH EVERY DAY 12/30/19   [name]., MD  sucralfate (CARAFATE) 1 g tablet Take one tablet in the morning and one tablet in the evening. Patient not taking: Reported on 06/06/2020 05/01/20   [name]., MD  cetirizine (ZYRTEC) 5 MG tablet Take 2 tablets (10 mg total) by mouth daily. 12/19/18 02/06/20  [name]., MD    Allergies  Allergen Reactions  . Lisinopril Cough  . Penicillins Swelling, Rash and Other (See Comments)    Did it involve swelling of the face/tongue/throat, SOB, or low BP? Yes Did it involve sudden or severe rash/hives, skin peeling, or any reaction on the inside of your mouth or nose? Yes Did you need to seek medical attention at a hospital or doctor's office? Yes When did it last happen?15 years If all above answers are "NO", may proceed with cephalosporin use.     Family History  Problem Relation Age of Onset  . Cancer Father   . Hypertension Father   . Heart disease Father   . Asthma Father   . Cancer Mother   . Hypertension Mother   . Pancreatic cancer Mother 33  . Cancer Sister   . Breast cancer Sister 19  . Breast cancer Sister 16  . Lung cancer Brother   . Pancreatic cancer Sister 47  . Cancer Sister     Social History Social History   Tobacco Use  . Smoking status: Former Smoker    Packs/day: 0.50    Types: Cigarettes    Quit date: 10/07/2001    Years since quitting: 18.7  .  Smokeless tobacco: Never Used  Vaping Use  . Vaping Use: Never used  Substance Use Topics  . Alcohol use: No    Alcohol/week: 0.0 standard drinks  . Drug use: No    Review of Systems Constitutional: Negative for fever Cardiovascular: Mild to moderate left chest pain radiating to the left arm at times. Respiratory: Negative for shortness of breath. Gastrointestinal: Negative for abdominal pain, vomiting Musculoskeletal: Negative for musculoskeletal complaints Neurological: Negative for  headache All other ROS negative  ____________________________________________   PHYSICAL EXAM:  VITAL SIGNS: ED Triage Vitals  Enc Vitals Group     BP 06/17/20 0908 (!) 149/79     Pulse Rate 06/17/20 0908 99     Resp 06/17/20 0908 15     Temp 06/17/20 0908 98.1 F (36.7 C)     Temp Source 06/17/20 0908 Oral     SpO2 06/17/20 0908 100 %     Weight --      Height --      Head Circumference --      Peak Flow --      Pain Score 06/17/20 0858 7     Pain Loc --      Pain Edu? --      Excl. in [place]? --    Constitutional: Alert and oriented. Well appearing and in no distress. Eyes: Normal exam ENT      Head: Normocephalic and atraumatic.      Mouth/Throat: Mucous membranes are moist. Cardiovascular: Normal rate, regular rhythm.  Respiratory: Normal respiratory effort without tachypnea nor retractions. Breath sounds are clear Gastrointestinal: Soft and nontender. No distention.   Musculoskeletal: Mild to moderate left upper arm tenderness to palpation.  Mild left chest wall tenderness to palpation.  No lower extremity edema or tenderness. Neurologic:  Normal speech and language. No gross focal neurologic deficits  Skin:  Skin is warm, dry and intact.  Psychiatric: Mood and affect are normal.   ____________________________________________    EKG  EKG viewed and interpreted by myself shows a normal sinus rhythm at 92 bpm with a slightly widened QRS, normal axis, largely normal intervals  with nonspecific ST changes no ST elevation, morphology most consistent with left bundle branch block.  Prior EKG 10/10/2019 also shows left bundle branch block.  ____________________________________________    RADIOLOGY  X-ray negative for acute abnormality  ____________________________________________   INITIAL IMPRESSION / ASSESSMENT AND PLAN / ED COURSE  Pertinent labs & imaging results that were available during my care of the patient were reviewed by me and considered in my medical decision making (see chart for details).   Patient presents emergency department for left chest pain with left arm radiation at times.  Recent shingles vaccination.  Patient is mildly tender over the left arm where she had the injection.  Otherwise appears well does state mild left chest discomfort currently which is somewhat reproducible on examination as well.  We will check labs including cardiac enzymes.  EKG shows left bundle branch block unchanged from prior.  Patient's labs show an elevated troponin of 253.  Review of historical values show that her troponin is usually negative.  Given the elevated troponin with chest pain radiating to her left arm we will admit to the hospital service for further work-up and treatment.  We will dose aspirin and repeat a troponin.  If troponin is trending up we will likely start on heparin.  I discussed this plan of care with the patient who is agreeable.  KHAMANI DANIELY was evaluated in Emergency Department on 06/17/2020 for the symptoms described in the history of present illness. She was evaluated in the context of the global COVID-19 pandemic, which necessitated consideration that the patient might be at risk for infection with the SARS-CoV-2 virus that causes COVID-19. Institutional protocols and algorithms that pertain to the evaluation of patients at risk for COVID-19 are in a state of rapid change based on information released by regulatory bodies including the CDC  and federal and state organizations. These policies and algorithms were followed during the patient's care in the ED.  ____________________________________________   FINAL CLINICAL IMPRESSION(S) / ED DIAGNOSES  Chest pain Elevated troponin   Harvest Dark, MD 06/17/20 1039

## 2020-06-17 NOTE — ED Notes (Signed)
Spoke with Dr Francine [place] who said to try NTG SL for sharp CP.  Daughter at bedside.

## 2020-06-17 NOTE — ED Notes (Signed)
Overdue meds not yet verified, except aspirin which will give now.  Messaged Dr Francine [place]ven to ask whether to give TG for sharp CP which pt continues to have.

## 2020-06-17 NOTE — ED Notes (Signed)
Overdue meds not verified yet by pharmacy. Third troponin to be collected at 1545, 2hr after last trop drawn.

## 2020-06-17 NOTE — ED Notes (Signed)
Med reconciliation currently being performed.

## 2020-06-17 NOTE — ED Notes (Signed)
Informed Dr Francine Graven of pt continued CP./ Pt not requesting more pain meds at this time.

## 2020-06-17 NOTE — ED Triage Notes (Signed)
Pt comes with c/o left sided CP that radiates to arm. Pt states she thinks it may be from her shingles shot. However her PCP wants her to get an EKG and get checked out.  Pt denies any SOB.

## 2020-06-17 NOTE — ED Notes (Signed)
Pt to ED c/o sharp stabbing CP since yesterday. Pt believes this may be related to shingles vaccine 3d ago on L arm. Denies SOB, diaphoresis, NVD. Pt in NAD at this time.

## 2020-06-17 NOTE — H&P (Signed)
History and Physical    Candace [name] ELF:[id number] DOB: 02-May-1950 DOA: 06/17/2020  PCP: [name]., MD   Patient coming from: Home  I have personally briefly reviewed patient's old medical records in [place]  Chief Complaint: Chest pain  HPI: Candace [name] is a 70 y.o. female with medical history significant for coronary artery disease status post CABG as well as PCI with stent, history of diabetes mellitus, paroxysmal A. fib, depression and anxiety who presents to the ER for evaluation of chest pain that started 1 day prior to presentation.  Patient states that she received the shingles vaccine over the weekend and 24 hours later developed pain in the left anterior chest wall which she described as a stabbing pain with radiation to her left arm.  She rated her pain a 6 x 10 in intensity at its and states that it has been constant pain which is worse when she lays down and improves when she is sitting up.  She has not had chest pain was a reaction to the vaccine she had over the weekend. She denies having any fever or chills, no nausea, no vomiting no palpitation or diaphoresis.  She has no headache, no blurred vision, no abdominal pain, no urinary symptoms, no changes in her bowel habits, no dizziness or lightheadedness. She states that this pain does not feel like her prior heart attack. She called her primary care provider who advised her to come to the ER for further evaluation. Labs show sodium 140, potassium 4.3, chloride 109, bicarb 24, glucose 183, BUN 11, creatinine 0.64, calcium 9.3, troponin 253, white count 6.9, hemoglobin 13.6, hematocrit 40 MCV 97.7, RDW 12.8, platelet count 211, hemoglobin A1c 12.2 from 05/01/20 Patient SARS coronavirus 2 point-of-care testing still pending Chest x-ray reviewed by me shows no cardiomegaly.  No acute pulmonary disease. Twelve-lead EKG reviewed by me shows sinus rhythm with a left bundle branch block.   ED Course:  Patient is a 70 year old African-American female with a history of coronary artery disease status post CABG/status post PCI with stent angioplasty, diabetes mellitus and hypertension who presents for evaluation of chest pain over the left anterior chest wall with radiation to the left arm.  Her pain is relieved when she is sitting up and worse when she lays down.  Pain does not feel similar to her last heart attack.  Her initial troponin is elevated at 253.  Twelve-lead EKG showed an old left bundle branch block.  She will be referred to observation status for further evaluation.    Review of Systems: As per HPI otherwise all other systems reviewed and negative.    Past Medical History:  Diagnosis Date  . Allergy   . Anemia   . Anxiety   . Arrhythmia   . Arthritis   . Atrial fibrillation ([place])   . Coronary artery disease   . Depression   . Diabetes mellitus without complication ([place])   . Dyspnea    doe  . Dysrhythmia   . GERD (gastroesophageal reflux disease)   . Headache   . History of hiatal hernia   . Hyperlipidemia   . Hypertension   . Myocardial infarction ([place])    2016, 04/2017  . Myocardial infarction with cardiac rehabilitation [facility])    MI 2016/ CABG 8/17    FINISHED CARDIAC REHAB 3 WEEKS AGO  . Panic attack   . Pneumonia   . Reflux   . Stroke [facility]) 2015   showed up  on MRI; no weakness noted  . TIA (transient ischemic attack)   . Voice tremor     Past Surgical History:  Procedure Laterality Date  . ABDOMINAL HYSTERECTOMY    . APPENDECTOMY  1975  . ARTERY BIOPSY Right 04/26/2016   Procedure: BIOPSY TEMPORAL ARTERY;  Surgeon: Margaretha Sheffield, MD;  Location: [facility] ORS;  Service: ENT;  Laterality: Right;  . CARDIAC CATHETERIZATION N/A 11/06/2015   Procedure: Left Heart Cath and Coronary Angiography;  Surgeon: Corey Skains, MD;  Location: [place] CV LAB;  Service: Cardiovascular;  Laterality: N/A;  . CESAREAN SECTION    . COLONOSCOPY  2015  . COLONOSCOPY WITH  PROPOFOL N/A 11/03/2018   Procedure: COLONOSCOPY WITH PROPOFOL;  Surgeon: Virgel Manifold, MD;  Location: [facility] ENDOSCOPY;  Service: Endoscopy;  Laterality: N/A;  . CORONARY ANGIOPLASTY  04/2017   [place]  . CORONARY ARTERY BYPASS GRAFT N/A 11/10/2015   Procedure: CORONARY ARTERY BYPASS GRAFTING (CABG), ON PUMP, TIMES FOUR, USING LEFT INTERNAL MAMMARY ARTERY, BILATERAL GREATER SAPHENOUS VEINS HARVESTED ENDOSCOPICALLY;  Surgeon: [name], MD;  Location: [place];  Service: Open Heart Surgery;  Laterality: N/A;  LIMA-LAD; SEQ SVG-OM1-OM2; SVG-PL  . CORONARY STENT INTERVENTION N/A 08/05/2016   Procedure: Coronary Stent Intervention;  Surgeon: Isaias Cowman, MD;  Location: [place] CV LAB;  Service: Cardiovascular;  Laterality: N/A;  . ESOPHAGOGASTRODUODENOSCOPY (EGD) WITH PROPOFOL N/A 11/03/2018   Procedure: ESOPHAGOGASTRODUODENOSCOPY (EGD) WITH PROPOFOL;  Surgeon: Virgel Manifold, MD;  Location: [facility] ENDOSCOPY;  Service: Endoscopy;  Laterality: N/A;  . HYSTERECTOMY ABDOMINAL WITH SALPINGO-OOPHORECTOMY Bilateral 08/15/2017   Procedure: HYSTERECTOMY ABDOMINAL WITH BILATERAL SALPINGO-OOPHORECTOMY;  Surgeon: Rubie Maid, MD;  Location: [facility] ORS;  Service: Gynecology;  Laterality: Bilateral;  . LEFT HEART CATH AND CORONARY ANGIOGRAPHY N/A 08/05/2016   Procedure: Left Heart Cath and Coronary Angiography;  Surgeon: Isaias Cowman, MD;  Location: [place] CV LAB;  Service: Cardiovascular;  Laterality: N/A;  . LEFT HEART CATH AND CORS/GRAFTS ANGIOGRAPHY N/A 09/19/2018   Procedure: LEFT HEART CATH AND CORS/GRAFTS ANGIOGRAPHY;  Surgeon: Corey Skains, MD;  Location: [place] CV LAB;  Service: Cardiovascular;  Laterality: N/A;  . LEFT HEART CATH AND CORS/GRAFTS ANGIOGRAPHY N/A 08/30/2019   Procedure: LEFT HEART CATH AND CORS/GRAFTS ANGIOGRAPHY;  Surgeon: Teodoro Spray, MD;  Location: [place] CV LAB;  Service: Cardiovascular;  Laterality: N/A;  .  OOPHORECTOMY    . TEE WITHOUT CARDIOVERSION N/A 11/10/2015   Procedure: TRANSESOPHAGEAL ECHOCARDIOGRAM (TEE);  Surgeon: [name], MD;  Location: [place];  Service: Open Heart Surgery;  Laterality: N/A;  . TUBAL LIGATION       reports that she quit smoking about 18 years ago. Her smoking use included cigarettes. She smoked 0.50 packs per day. She has never used smokeless tobacco. She reports that she does not drink alcohol and does not use drugs.  Allergies  Allergen Reactions  . Lisinopril Cough  . Penicillins Swelling, Rash and Other (See Comments)    Did it involve swelling of the face/tongue/throat, SOB, or low BP? Yes Did it involve sudden or severe rash/hives, skin peeling, or any reaction on the inside of your mouth or nose? Yes Did you need to seek medical attention at a hospital or doctor's office? Yes When did it last happen?15 years If all above answers are "NO", may proceed with cephalosporin use.     Family History  Problem Relation Age of Onset  . Cancer Father   . Hypertension Father   .  Heart disease Father   . Asthma Father   . Cancer Mother   . Hypertension Mother   . Pancreatic cancer Mother 69  . Cancer Sister   . Breast cancer Sister 65  . Breast cancer Sister 86  . Lung cancer Brother   . Pancreatic cancer Sister 52  . Cancer Sister       Prior to Admission medications   Medication Sig Start Date End Date Taking? Authorizing Provider  acetaminophen (TYLENOL) 325 MG tablet Take 650 mg by mouth every 6 (six) hours as needed for moderate pain or headache.     [provider]  atorvastatin (LIPITOR) 40 MG tablet Take 1 tablet (40 mg total) by mouth daily. Patient not taking: Reported on 06/06/2020 08/31/19   Fritzi Mandes, MD  benzonatate (TESSALON) 100 MG capsule Take 1 capsule (100 mg total) by mouth 3 (three) times daily as needed for cough. Patient not taking: Reported on 06/06/2020 05/13/20   [name]., MD  bismuth  subsalicylate (PEPTO BISMOL) 262 MG chewable tablet Chew 524 mg by mouth as needed.    [provider]  Dulaglutide (TRULICITY) 3.87 FI/4.3PI SOPN Inject 0.75 mg into the skin once a week. 05/19/20   [name]., MD  ELIQUIS 5 MG TABS tablet Take 5 mg by mouth 2 (two) times daily. 03/26/20   [provider]  isosorbide mononitrate (IMDUR) 60 MG 24 hr tablet Take 1 tablet (60 mg total) by mouth daily. 03/08/19   Loletha Grayer, MD  JARDIANCE 25 MG TABS tablet TAKE 1 TABLET BY MOUTH DAILY BEFORE BREAKFAST. 06/04/20   [name]., MD  Lancets [facility] DELICA PLUS [id number]) MISC USE UP TO 4 TIMES DAILY AS DIRECTED 07/10/19   [name]., MD  loperamide (IMODIUM) 2 MG capsule Take 2 mg by mouth as needed.    [provider]  metoprolol tartrate (LOPRESSOR) 100 MG tablet Take 100 mg by mouth 2 (two) times daily. 10/03/18   [provider]  nitroGLYCERIN (NITROSTAT) 0.4 MG SL tablet Place 1 tablet (0.4 mg total) under the tongue every 5 (five) minutes as needed for chest pain. 07/27/16   Bettey Costa, MD  nystatin (NYSTATIN) powder Apply 1 application topically daily. Patient taking differently: Apply 1 application topically daily as needed. 02/14/20   Rubie Maid, MD  ondansetron (ZOFRAN-ODT) 8 MG disintegrating tablet Take 8 mg by mouth every 8 (eight) hours as needed. 02/12/20   [provider]  pantoprazole (PROTONIX) 20 MG tablet Take 1 tablet (20 mg total) by mouth 2 (two) times daily. 06/10/20   [name]., MD  sertraline (ZOLOFT) 50 MG tablet TAKE 1 TABLET BY MOUTH EVERY DAY 12/30/19   [name]., MD  sucralfate (CARAFATE) 1 g tablet Take one tablet in the morning and one tablet in the evening. Patient not taking: Reported on 06/06/2020 05/01/20   [name]., MD  cetirizine (ZYRTEC) 5 MG tablet Take 2 tablets (10 mg total) by mouth daily. 12/19/18 02/06/20  [name]., MD     Physical Exam: Vitals:   06/17/20 0909 06/17/20 1000 06/17/20 1030 06/17/20 1200  BP: (!) 146/79 (!) 144/88 (!) 133/91 137/89  Pulse: (!) 107 85 81 87  Resp: 20 17 17 17   Temp:      TempSrc:      SpO2: 98% 93% 96% 95%     Vitals:   06/17/20 0909 06/17/20 1000 06/17/20 1030  06/17/20 1200  BP: (!) 146/79 (!) 144/88 (!) 133/91 137/89  Pulse: (!) 107 85 81 87  Resp: 20 17 17 17   Temp:      TempSrc:      SpO2: 98% 93% 96% 95%      Constitutional: Alert and oriented x 3 . Not in any apparent distress HEENT:      Head: Normocephalic and atraumatic.         Eyes: PERLA, EOMI, Conjunctivae are normal. Sclera is non-icteric.       Mouth/Throat: Mucous membranes are moist.       Neck: Supple with no signs of meningismus. Cardiovascular: Regular rate and rhythm. No murmurs, gallops, or rubs. 2+ symmetrical distal pulses are present . No JVD. No LE edema.  Area of point tenderness on the left anterior chest wall, pain is reproducible Respiratory: Respiratory effort normal .Lungs sounds clear bilaterally. No wheezes, crackles, or rhonchi.  Gastrointestinal: Soft, non tender, and non distended with positive bowel sounds.  Genitourinary: No CVA tenderness. Musculoskeletal: Nontender with normal range of motion in all extremities. No cyanosis, or erythema of extremities. Neurologic:  Face is symmetric. Moving all extremities. No gross focal neurologic deficits  Skin: Skin is warm, dry.  No rash or ulcers Psychiatric: Mood and affect are normal   Labs on Admission: I have personally reviewed following labs and imaging studies  CBC: Recent Labs  Lab 06/17/20 0920  WBC 6.9  HGB 13.6  HCT 42.0  MCV 97.7  PLT 675   Basic Metabolic Panel: Recent Labs  Lab 06/17/20 0920  NA 140  K 4.3  CL 109  CO2 24  GLUCOSE 183*  BUN 11  CREATININE 0.64  CALCIUM 9.3   GFR: CrCl cannot be calculated (Unknown ideal weight.). Liver Function Tests: No results for input(s): AST, ALT,  ALKPHOS, BILITOT, PROT, ALBUMIN in the last 168 hours. No results for input(s): LIPASE, AMYLASE in the last 168 hours. No results for input(s): AMMONIA in the last 168 hours. Coagulation Profile: No results for input(s): INR, PROTIME in the last 168 hours. Cardiac Enzymes: No results for input(s): CKTOTAL, CKMB, CKMBINDEX, TROPONINI in the last 168 hours. BNP (last 3 results) No results for input(s): PROBNP in the last 8760 hours. HbA1C: No results for input(s): HGBA1C in the last 72 hours. CBG: No results for input(s): GLUCAP in the last 168 hours. Lipid Profile: No results for input(s): CHOL, HDL, LDLCALC, TRIG, CHOLHDL, LDLDIRECT in the last 72 hours. Thyroid Function Tests: No results for input(s): TSH, T4TOTAL, FREET4, T3FREE, THYROIDAB in the last 72 hours. Anemia Panel: No results for input(s): VITAMINB12, FOLATE, FERRITIN, TIBC, IRON, RETICCTPCT in the last 72 hours. Urine analysis:    Component Value Date/Time   COLORURINE YELLOW (A) 12/07/2018 2240   APPEARANCEUR CLOUDY (A) 12/07/2018 2240   APPEARANCEUR Clear 12/24/2014 0905   LABSPEC 1.020 12/07/2018 2240   LABSPEC 1.030 09/14/2013 2123   PHURINE 5.0 12/07/2018 [place] 12/07/2018 2240   GLUCOSEU Negative 09/14/2013 2123   HGBUR SMALL (A) 12/07/2018 2240   BILIRUBINUR Negative 02/06/2020 1014   BILIRUBINUR Negative 12/24/2014 0905   BILIRUBINUR Negative 09/14/2013 2123   KETONESUR 5 (A) 12/07/2018 2240   PROTEINUR Positive (A) 02/06/2020 1014   PROTEINUR 30 (A) 12/07/2018 2240   UROBILINOGEN 0.2 02/06/2020 1014   NITRITE Negative 02/06/2020 1014   NITRITE NEGATIVE 12/07/2018 2240   LEUKOCYTESUR Negative 02/06/2020 1014   LEUKOCYTESUR LARGE (A) 12/07/2018 2240   LEUKOCYTESUR 3+ 09/14/2013 2123  Radiological Exams on Admission: DG Chest 2 View  Result Date: 06/17/2020 CLINICAL DATA:  Chest pain. EXAM: CHEST - 2 VIEW COMPARISON:  08/29/2019.  CT 12/07/2018. FINDINGS: Mediastinum hilar  structures are unremarkable. Prior CABG. Heart size normal. No focal infiltrate. Tiny right pleural effusion versus pleural scarring. No pneumothorax. Stable soft tissue calcification noted about the left shoulder. Degenerative changes both shoulders and thoracic spine. Mild thoracic spine scoliosis. IMPRESSION: 1.  Prior CABG.  Heart size stable. 2. Tiny right pleural effusion versus pleural scarring. No acute infiltrates. Electronically Signed   By: Marcello Moores  Register   On: 06/17/2020 09:41     Assessment/Plan Principal Problem:   Chest pain Active Problems:   Essential hypertension   Coronary artery disease   Type 2 diabetes mellitus with complication, without long-term current use of insulin (HCC)   Depression with anxiety   AF (paroxysmal atrial fibrillation) (HCC)   Chronic diastolic CHF (congestive heart failure) (HCC)     Chest pain In a patient with known coronary artery disease status post CABG status post PCI with stent angioplasty Concerning for possible non-ST elevation MI We will cycle cardiac enzymes Continue metoprolol, nitrates, aspirin, statins Obtain 2D echocardiogram to rule out regional wall motion abnormality We will consult cardiology troponin continues to show an upward trend    Paroxysmal atrial fibrillation Continue metoprolol for rate control Continue apixaban as primary prophylaxis for an acute stroke    Diabetes mellitus Patient noted to have an elevated hemoglobin A1c level at 12 consistent with poor glycemic control Maintain consistent carbohydrate diet Sliding scale insulin for glycemic control    Depression and anxiety Continue sertraline   Hypertension Blood pressure is stable on nitrates and metoprolol  DVT prophylaxis: Apixaban Code Status: full code Family Communication: Greater than 50% of time was spent discussing plan of care with patient at the bedside.  All questions and concerns have been addressed.  She verbalizes  understanding and agrees with the plan. Disposition Plan: Back to previous home environment Consults called: none Status: Observation    Tochukwu Agbata MD [facility]     06/17/2020, 12:10 PM

## 2020-06-17 NOTE — Progress Notes (Signed)
Patient admitted to the unit in no distress. VSS stable

## 2020-06-17 NOTE — ED Notes (Signed)
Looked fo rpt. Pt in xray. Will draw labs etc when pt returns.

## 2020-06-17 NOTE — ED Notes (Signed)
Rapid covid test ran at bedside, result negative. Control completed.

## 2020-06-17 NOTE — ED Notes (Signed)
Troponin collected. Pt transported to new bed via wheelchair.

## 2020-06-17 NOTE — Telephone Encounter (Signed)
Pt called stating she this having a reaction to her shingles vaccine that she received on 06/14/20; she is having left arm pain that started on 06/15/20; she has taken Tylenol for the pain; on 06/18/20 she developed stabbing chest pain and could not lay down; the pt continues to have chest pain rated 6 out of 10 and constant; last night her chest pain was 9 out of 10; the pt says she has a quarter-sized area of swelling; recommendations made per nurse triage protocol; the pt she verbalized understanding and will go to the ED; the pt is seen by Dr Miguel Aschoff, [facility]; will route to office for notification.    Reason for Disposition . Sounds like a severe, unusual reaction to the triager  Answer Assessment - Initial Assessment Questions 1. SYMPTOMS: "What is the main symptom?" (e.g., redness, swelling, pain)      Left arm pain 2. ONSET: "When was the vaccine (shot) given?" "How much later did the  begin?" (e.g., hours, days ago)     Received vaccine on  06/14/20; symptoms started on 06/15/20 3. SEVERITY: "How bad is it?"      6-9 out of 10 4. FEVER: "Is there a fever?" If Yes, ask: "What is it, how was it measured, and when did it start?"   no 5. IMMUNIZATIONS GIVEN: "What shots have you recently received?"     shingles 6. PAST REACTIONS: "Have you reacted to immunizations before?" If Yes, ask: "What happened?"     Yes when taking COVID vaccine; sleepy, nausea 7. OTHER SYMPTOMS: "Do you have any other symptoms?"     Constant Stabbing chest pain  Protocols used: IMMUNIZATION REACTIONS-A-AH

## 2020-06-17 NOTE — ED Notes (Signed)
Called lab to clarify upcoming troponin draws. They will retime second troponin draw and cancel the third/duplicate troponin draw. Second troponin will be timed for 1120.

## 2020-06-17 NOTE — Telephone Encounter (Signed)
Please advise 

## 2020-06-18 ENCOUNTER — Observation Stay
Admit: 2020-06-18 | Discharge: 2020-06-18 | Disposition: A | Discharge status: Home / Self Care | Payer: Medicare Other | Attending: Internal Medicine | Admitting: Internal Medicine

## 2020-06-18 DIAGNOSIS — R079 Chest pain, unspecified: Secondary | ICD-10-CM

## 2020-06-18 DIAGNOSIS — I480 Paroxysmal atrial fibrillation: Secondary | ICD-10-CM | POA: Diagnosis not present

## 2020-06-18 DIAGNOSIS — E118 Type 2 diabetes mellitus with unspecified complications: Secondary | ICD-10-CM | POA: Diagnosis not present

## 2020-06-18 LAB — ECHOCARDIOGRAM COMPLETE
AR max vel: 1.7 cm2
AV Area VTI: 1.83 cm2
AV Area mean vel: 1.67 cm2
AV Mean grad: 4.5 mmHg
AV Peak grad: 8 mmHg
Ao pk vel: 1.41 m/s
Area-P 1/2: 2.97 cm2
Height: 62 in
S' Lateral: 2.6 cm
Weight: 2627.2 oz

## 2020-06-18 LAB — GLUCOSE, CAPILLARY
Glucose-Capillary: 131 mg/dL — ABNORMAL HIGH (ref 70–99)
Glucose-Capillary: 122 mg/dL — ABNORMAL HIGH (ref 70–99)

## 2020-06-18 MED ORDER — COLCHICINE 0.6 MG PO TABS: 0.6000 mg | ORAL_TABLET | Freq: Two times a day (BID) | ORAL | 0 refills | Status: DC

## 2020-06-18 MED ORDER — ATORVASTATIN CALCIUM 40 MG PO TABS: 40.0000 mg | ORAL_TABLET | Freq: Every day | ORAL | 0 refills | Status: DC

## 2020-06-18 MED ORDER — PERFLUTREN LIPID MICROSPHERE
1.0000 mL | INTRAVENOUS | Status: AC | PRN
  Administered 2020-06-18: 2 mL via INTRAVENOUS
  Filled 2020-06-18: qty 10

## 2020-06-18 MED ORDER — ASPIRIN 81 MG PO TBEC: 81.0000 mg | DELAYED_RELEASE_TABLET | Freq: Every day | ORAL | 0 refills | Status: DC

## 2020-06-18 MED ORDER — POLYETHYLENE GLYCOL 3350 17 G PO PACK
17.0000 g | PACK | Freq: Every day | ORAL | Status: DC
  Administered 2020-06-18: 17 g via ORAL
  Filled 2020-06-18: qty 1

## 2020-06-18 MED ORDER — COLCHICINE 0.6 MG PO TABS
0.6000 mg | ORAL_TABLET | Freq: Two times a day (BID) | ORAL | Status: DC
  Administered 2020-06-18: 0.6 mg via ORAL
  Filled 2020-06-18: qty 1

## 2020-06-18 NOTE — Progress Notes (Signed)
Candace [name] to be D/C'd Home per MD order.  Discussed prescriptions and follow up appointments with the patient. Prescriptions given to patient, medication list explained in detail. Pt verbalized understanding.  Allergies as of 06/18/2020      Reactions   Lisinopril Cough   Penicillins Swelling, Rash, Other (See Comments)   Did it involve swelling of the face/tongue/throat, SOB, or low BP? Yes Did it involve sudden or severe rash/hives, skin peeling, or any reaction on the inside of your mouth or nose? Yes Did you need to seek medical attention at a hospital or doctor's office? Yes When did it last happen?15 years If all above answers are "NO", may proceed with cephalosporin use.      Medication List    TAKE these medications   acetaminophen 325 MG tablet Commonly known as: TYLENOL Take 650 mg by mouth every 6 (six) hours as needed for moderate pain or headache.   aspirin 81 MG EC tablet Take 1 tablet (81 mg total) by mouth daily. Swallow whole. Start taking on: June 19, 2020   atorvastatin 40 MG tablet Commonly known as: LIPITOR Take 1 tablet (40 mg total) by mouth daily.   benzonatate 100 MG capsule Commonly known as: TESSALON Take 1 capsule (100 mg total) by mouth 3 (three) times daily as needed for cough.   bismuth subsalicylate 546 MG chewable tablet Commonly known as: PEPTO BISMOL Chew 524 mg by mouth as needed.   colchicine 0.6 MG tablet Take 1 tablet (0.6 mg total) by mouth 2 (two) times daily.   Eliquis 5 MG Tabs tablet Generic drug: apixaban Take 5 mg by mouth 2 (two) times daily.   isosorbide mononitrate 60 MG 24 hr tablet Commonly known as: IMDUR Take 1 tablet (60 mg total) by mouth daily.   Jardiance 25 MG Tabs tablet Generic drug: empagliflozin TAKE 1 TABLET BY MOUTH DAILY BEFORE BREAKFAST. What changed:   how much to take  when to take this   loperamide 2 MG capsule Commonly known as: IMODIUM Take 2 mg by mouth as needed.    metoprolol tartrate 100 MG tablet Commonly known as: LOPRESSOR Take 100 mg by mouth 2 (two) times daily.   nitroGLYCERIN 0.4 MG SL tablet Commonly known as: NITROSTAT Place 1 tablet (0.4 mg total) under the tongue every 5 (five) minutes as needed for chest pain.   nystatin powder Commonly known as: nystatin Apply 1 application topically daily. What changed:   when to take this  reasons to take this   ondansetron 8 MG disintegrating tablet Commonly known as: ZOFRAN-ODT Take 8 mg by mouth every 8 (eight) hours as needed.   OneTouch Delica Plus [id number] Misc USE UP TO 4 TIMES DAILY AS DIRECTED   pantoprazole 20 MG tablet Commonly known as: Protonix Take 1 tablet (20 mg total) by mouth 2 (two) times daily.   sertraline 50 MG tablet Commonly known as: ZOLOFT TAKE 1 TABLET BY MOUTH EVERY DAY   sucralfate 1 g tablet Commonly known as: CARAFATE Take one tablet in the morning and one tablet in the evening.   Trulicity 1.27 NT/7.0YF Sopn Generic drug: Dulaglutide Inject 0.75 mg into the skin once a week.       Vitals:   06/18/20 1122 06/18/20 1537  BP: 114/66 (!) 101/49  Pulse: 75 61  Resp: 18 18  Temp: (!) 97.4 F (36.3 C) 97.9 F (36.6 C)  SpO2: 98% 96%    Skin clean, dry and intact without evidence of  skin break down, no evidence of skin tears noted. IV catheter discontinued intact. Site without signs and symptoms of complications. Dressing and pressure applied. Pt denies pain at this time. No complaints noted.  An After Visit Summary was printed and given to the patient. Patient escorted via [place], and D/C home via private auto.  Rolley Sims

## 2020-06-18 NOTE — Progress Notes (Signed)
*  PRELIMINARY RESULTS* Echocardiogram 2D Echocardiogram has been performed.  Candace [name] 06/18/2020, 11:04 AM

## 2020-06-18 NOTE — Discharge Summary (Signed)
Physician Discharge Summary  Patient ID: Candace [name] MRN: [id number] DOB/AGE: 69-Nov-1952 69 y.o.  Admit date: 06/17/2020 Discharge date: 06/18/2020  Admission Diagnoses:  Discharge Diagnoses:  Principal Problem:   Chest pain Active Problems:   Essential hypertension   Coronary artery disease   Type 2 diabetes mellitus with complication, without long-term current use of insulin (HCC)   Depression with anxiety   AF (paroxysmal atrial fibrillation) (HCC)   Chronic diastolic CHF (congestive heart failure) ([place])   Discharged Condition: good  Hospital Course:  Candace [name] is a 70 y.o. female with medical history significant for coronary artery disease status post CABG as well as PCI with stent, history of diabetes mellitus, paroxysmal A. fib, depression and anxiety who presents to the ER for evaluation of chest pain that started 1 day prior to presentation.  Patient states that she received the shingles vaccine over the weekend and 24 hours later developed pain in the left anterior chest wall which she described as a stabbing pain with radiation to her left arm.  She rated her pain a 6 x 10 in intensity at its and states that it has been constant pain which is worse when she lays down and improves when she is sitting up.   Patient has been seen by cardiology, her peak troponin was elevated at 438, came down to 344.  Discussed the case with Dr. Ubaldo Glassing, he believes this is due to inflammation from shingles vaccination.  He has started patient on aspirin and colchicine.  Echocardiogram did not show any focal motion abnormality.  #1.  Chest pain. Elevated troponin secondary to demand ischemia.   Likely secondary to inflammation, with the possible pericarditis. Cardiology has seen the patient, deemed medically stable to be discharged.  Will follow up with cardiology as outpatient.  Continue colchicine and aspirin.  #2.  Paroxysmal atrial fibrillation. Continue home  medicines.    Consults: cardiology  Significant Diagnostic Studies:  1. Left ventricular ejection fraction, by estimation, is 60 to 65%. The left ventricle has normal function. The left ventricle has no regional wall motion abnormalities. There is moderate left ventricular hypertrophy. Left ventricular diastolic parameters are consistent with Grade I diastolic dysfunction (impaired relaxation). 2. Right ventricular systolic function is normal. The right ventricular size is mildly enlarged. 3. The mitral valve is grossly normal. Mild mitral valve regurgitation. 4. The aortic valve is calcified. Aortic valve regurgitation is trivial. Mild aortic valve sclerosis is present, with no evidence of aortic valve stenosis.   Treatments: Cardiac monitoring  Discharge Exam: Blood pressure 114/66, pulse 75, temperature (!) 97.4 F (36.3 C), temperature source Oral, resp. rate 18, height 5\' 2"  (1.575 m), weight 74.5 kg, SpO2 98 %. General appearance: alert and cooperative Resp: clear to auscultation bilaterally Cardio: regular rate and rhythm, S1, S2 normal, no murmur, click, rub or gallop GI: soft, non-tender; bowel sounds normal; no masses,  no organomegaly Extremities: extremities normal, atraumatic, no cyanosis or edema  Disposition: Discharge disposition: 01-Home or Self Care       Discharge Instructions    Diet - low sodium heart healthy   Complete by: As directed    Increase activity slowly   Complete by: As directed      Allergies as of 06/18/2020      Reactions   Lisinopril Cough   Penicillins Swelling, Rash, Other (See Comments)   Did it involve swelling of the face/tongue/throat, SOB, or low BP? Yes Did it involve sudden or severe rash/hives, skin peeling,  or any reaction on the inside of your mouth or nose? Yes Did you need to seek medical attention at a hospital or doctor's office? Yes When did it last happen?15 years If all above answers are "NO", may proceed  with cephalosporin use.      Medication List    TAKE these medications   acetaminophen 325 MG tablet Commonly known as: TYLENOL Take 650 mg by mouth every 6 (six) hours as needed for moderate pain or headache.   aspirin 81 MG EC tablet Take 1 tablet (81 mg total) by mouth daily. Swallow whole. Start taking on: June 19, 2020   atorvastatin 40 MG tablet Commonly known as: LIPITOR Take 1 tablet (40 mg total) by mouth daily.   benzonatate 100 MG capsule Commonly known as: TESSALON Take 1 capsule (100 mg total) by mouth 3 (three) times daily as needed for cough.   bismuth subsalicylate 818 MG chewable tablet Commonly known as: PEPTO BISMOL Chew 524 mg by mouth as needed.   colchicine 0.6 MG tablet Take 1 tablet (0.6 mg total) by mouth 2 (two) times daily.   Eliquis 5 MG Tabs tablet Generic drug: apixaban Take 5 mg by mouth 2 (two) times daily.   isosorbide mononitrate 60 MG 24 hr tablet Commonly known as: IMDUR Take 1 tablet (60 mg total) by mouth daily.   Jardiance 25 MG Tabs tablet Generic drug: empagliflozin TAKE 1 TABLET BY MOUTH DAILY BEFORE BREAKFAST. What changed:   how much to take  when to take this   loperamide 2 MG capsule Commonly known as: IMODIUM Take 2 mg by mouth as needed.   metoprolol tartrate 100 MG tablet Commonly known as: LOPRESSOR Take 100 mg by mouth 2 (two) times daily.   nitroGLYCERIN 0.4 MG SL tablet Commonly known as: NITROSTAT Place 1 tablet (0.4 mg total) under the tongue every 5 (five) minutes as needed for chest pain.   nystatin powder Commonly known as: nystatin Apply 1 application topically daily. What changed:   when to take this  reasons to take this   ondansetron 8 MG disintegrating tablet Commonly known as: ZOFRAN-ODT Take 8 mg by mouth every 8 (eight) hours as needed.   OneTouch Delica Plus [id number] Misc USE UP TO 4 TIMES DAILY AS DIRECTED   pantoprazole 20 MG tablet Commonly known as: Protonix Take 1  tablet (20 mg total) by mouth 2 (two) times daily.   sertraline 50 MG tablet Commonly known as: ZOLOFT TAKE 1 TABLET BY MOUTH EVERY DAY   sucralfate 1 g tablet Commonly known as: CARAFATE Take one tablet in the morning and one tablet in the evening.   Trulicity 7.89 FY/1.0FB Sopn Generic drug: Dulaglutide Inject 0.75 mg into the skin once a week.       Follow-up Information    Corey Skains, MD Follow up in 1 week(s).   Specialty: Cardiology Contact information: [facility] [place] [address] [phone]        [name]., MD Follow up in 1 week(s).   Specialty: Family Medicine Contact information: [address] [place] [address] [phone]               Signed: Sharen Hones 06/18/2020, 3:34 PM

## 2020-06-18 NOTE — Progress Notes (Addendum)
Patient Name: Candace [name] Date of Encounter: 06/18/2020  Hospital Problem List     Principal Problem:   Chest pain Active Problems:   Essential hypertension   Coronary artery disease   Type 2 diabetes mellitus with complication, without long-term current use of insulin (HCC)   Depression with anxiety   AF (paroxysmal atrial fibrillation) (HCC)   Chronic diastolic CHF (congestive heart failure) [facility])    Patient Profile     70 y.o. female with history of history of coronary artery disease status post coronary artery bypass grafting at [facility] in 2017 with a LIMA to the LAD, saphenous vein graft to the PDA, OM1, OM 2 and with a subsequent stent in the OM 2 in 2018.  He had a p.o. BA to a small vessel and February 2019, bilateral carotid disease, hypertension, paroxysmal A. fib, chronic diastolic dysfunction who presented to the emergency room with complaints of chest pain that started 1 day prior to presentation.  She states that she was fine until she received her shingles vaccine.  24 hours later developed left anterior chest pain described as stabbing with radiation to her arm.  It was a pulsatile pain and not squeezing sensation.  Somewhat different than her angina.  Pain was not exertional.  She presented to the emergency room where serum troponin was 253.  EKG showed sinus rhythm with left bundle branch block.  Left bundle was chronic.  Chest x-ray showed prior CABG with no congestive heart failure.  Most recent cath was in June of last year revealing three-vessel coronary disease with a patent LIMA to the LAD, patent saphenous vein graft to PDA with a stent in the saphenous vein graft to the OM.  Medical management was recommended.  She is currently pain-free and hemodynamically stable.  Pain is different than her angina.  Subjective   Continues to have occasional episodes of chest pain.  Still atypical for her angina.  Inpatient Medications    . apixaban  5 mg Oral BID  .  aspirin EC  81 mg Oral Daily  . atorvastatin  40 mg Oral Daily  . insulin aspart  0-15 Units Subcutaneous TID WC  . isosorbide mononitrate  60 mg Oral Daily  . metoprolol tartrate  100 mg Oral BID  . pantoprazole  20 mg Oral BID  . polyethylene glycol  17 g Oral Daily  . sertraline  50 mg Oral Daily  . sucralfate  1 g Oral TID WC & HS    Vital Signs    Vitals:   06/18/20 0355 06/18/20 0358 06/18/20 0828 06/18/20 1122  BP: 118/68 (!) 141/79 120/69 114/66  Pulse: 78 65 66 75  Resp: 20 20 17 18   Temp: 97.6 F (36.4 C) 97.8 F (36.6 C) 97.9 F (36.6 C) (!) 97.4 F (36.3 C)  TempSrc: Oral Oral  Oral  SpO2: 94% 99% 98% 98%  Weight:   74.5 kg   Height:        Intake/Output Summary (Last 24 hours) at 06/18/2020 1254 Last data filed at 06/18/2020 0958 Gross per 24 hour  Intake -  Output 400 ml  Net -400 ml   Filed Weights   06/17/20 2004 06/18/20 0828  Weight: 120 kg 74.5 kg    Physical Exam    GEN: Well nourished, well developed, in no acute distress.  HEENT: normal.  Neck: Supple, no JVD, carotid bruits, or masses. Cardiac: RRR, no murmurs, rubs, or gallops. No clubbing, cyanosis, edema.  Radials/DP/PT 2+ and equal bilaterally.  Respiratory:  Respirations regular and unlabored, clear to auscultation bilaterally. GI: Soft, nontender, nondistended, BS + x 4. MS: no deformity or atrophy. Skin: warm and dry, no rash. Neuro:  Strength and sensation are intact. Psych: Normal affect.  Labs    CBC Recent Labs    06/17/20 0920  WBC 6.9  HGB 13.6  HCT 42.0  MCV 97.7  PLT 735   Basic Metabolic Panel Recent Labs    06/17/20 0920  NA 140  K 4.3  CL 109  CO2 24  GLUCOSE 183*  BUN 11  CREATININE 0.64  CALCIUM 9.3   Liver Function Tests No results for input(s): AST, ALT, ALKPHOS, BILITOT, PROT, ALBUMIN in the last 72 hours. No results for input(s): LIPASE, AMYLASE in the last 72 hours. Cardiac Enzymes No results for input(s): CKTOTAL, CKMB, CKMBINDEX,  TROPONINI in the last 72 hours. BNP No results for input(s): BNP in the last 72 hours. D-Dimer No results for input(s): DDIMER in the last 72 hours. Hemoglobin A1C No results for input(s): HGBA1C in the last 72 hours. Fasting Lipid Panel No results for input(s): CHOL, HDL, LDLCALC, TRIG, CHOLHDL, LDLDIRECT in the last 72 hours. Thyroid Function Tests No results for input(s): TSH, T4TOTAL, T3FREE, THYROIDAB in the last 72 hours.  Invalid input(s): FREET3  Telemetry    Normal sinus rhythm  ECG    Sinus rhythm with no ischemia  Radiology    DG Chest 2 View  Result Date: 06/17/2020 CLINICAL DATA:  Chest pain. EXAM: CHEST - 2 VIEW COMPARISON:  08/29/2019.  CT 12/07/2018. FINDINGS: Mediastinum hilar structures are unremarkable. Prior CABG. Heart size normal. No focal infiltrate. Tiny right pleural effusion versus pleural scarring. No pneumothorax. Stable soft tissue calcification noted about the left shoulder. Degenerative changes both shoulders and thoracic spine. Mild thoracic spine scoliosis. IMPRESSION: 1.  Prior CABG.  Heart size stable. 2. Tiny right pleural effusion versus pleural scarring. No acute infiltrates. Electronically Signed   By: Marcello Moores  Register   On: 06/17/2020 09:41    Assessment & Plan    70 year old female status post coronary bypass grafting who developed chest pain somewhat atypical for angina which occurred 24 hours after getting a shingles vaccine.  She presented to the emergency room her EKG was unchanged.  She was noted to have elevated serum troponins which were relatively flat at 253-344.  1.  Chest pain-appears to be secondary to her shingles vaccine.  Does not appear to be an acute coronary event.  Pain is somewhat atypical for angina.  Occurred after getting her shingles vaccine.  Currently hemodynamically stable.  Not a candidate for invasive evaluation at present.  Cath less than 1 year ago showed patent graft and stent.  Pain is currently stable.  Would  continue with Imdur 60 mg daily, metoprolol tartrate 100 twice daily and atorvastatin.  Would also continue with aspirin.  Will add colchicine 0.6 mg twice daily to her regimen given the likelihood this is myocarditis secondary to her vaccine.  We will follow her up in the office in  1 week.  Okay for discharge from a cardiac standpoint.  2.  Hyperlipidemia-continue with atorvastatin.  Low-fat diet.  Signed, Javier Docker Fath MD 06/18/2020, 12:54 PM  Pager: [phone]

## 2020-06-23 ENCOUNTER — Ambulatory Visit: Payer: Medicaid Other

## 2020-06-25 ENCOUNTER — Telehealth: Payer: Self-pay

## 2020-06-25 NOTE — Progress Notes (Signed)
    Chronic Care Management Pharmacy Assistant   Name: Candace [name]  MRN: [id number] DOB: 03/15/1951   Reason for Encounter: Medication Review   Medications: Outpatient Encounter Medications as of 06/25/2020  Medication Sig Note  . acetaminophen (TYLENOL) 325 MG tablet Take 650 mg by mouth every 6 (six) hours as needed for moderate pain or headache.    Marland Kitchen aspirin EC 81 MG EC tablet Take 1 tablet (81 mg total) by mouth daily. Swallow whole.   Marland Kitchen atorvastatin (LIPITOR) 40 MG tablet Take 1 tablet (40 mg total) by mouth daily.   . benzonatate (TESSALON) 100 MG capsule Take 1 capsule (100 mg total) by mouth 3 (three) times daily as needed for cough. (Patient not taking: No sig reported)   . bismuth subsalicylate (PEPTO BISMOL) 262 MG chewable tablet Chew 524 mg by mouth as needed.   . colchicine 0.6 MG tablet Take 1 tablet (0.6 mg total) by mouth 2 (two) times daily.   . Dulaglutide (TRULICITY) 4.65 KP/5.4SF SOPN Inject 0.75 mg into the skin once a week.   Marland Kitchen ELIQUIS 5 MG TABS tablet Take 5 mg by mouth 2 (two) times daily. 06/06/2020: Prescribed by Dr. Nehemiah Massed  . isosorbide mononitrate (IMDUR) 60 MG 24 hr tablet Take 1 tablet (60 mg total) by mouth daily. 06/06/2020: Prescribed by Dr. Nehemiah Massed   . JARDIANCE 25 MG TABS tablet TAKE 1 TABLET BY MOUTH DAILY BEFORE BREAKFAST. (Patient taking differently: Take 25 mg by mouth daily.)   . Lancets (ONETOUCH DELICA PLUS [id number]) MISC USE UP TO 4 TIMES DAILY AS DIRECTED   . loperamide (IMODIUM) 2 MG capsule Take 2 mg by mouth as needed.   . metoprolol tartrate (LOPRESSOR) 100 MG tablet Take 100 mg by mouth 2 (two) times daily. 06/06/2020: Prescribed by Dr. Nehemiah Massed   . nitroGLYCERIN (NITROSTAT) 0.4 MG SL tablet Place 1 tablet (0.4 mg total) under the tongue every 5 (five) minutes as needed for chest pain.   Marland Kitchen nystatin (NYSTATIN) powder Apply 1 application topically daily. (Patient taking differently: Apply 1 application topically daily as needed.)   .  ondansetron (ZOFRAN-ODT) 8 MG disintegrating tablet Take 8 mg by mouth every 8 (eight) hours as needed.   . pantoprazole (PROTONIX) 20 MG tablet Take 1 tablet (20 mg total) by mouth 2 (two) times daily.   . sertraline (ZOLOFT) 50 MG tablet TAKE 1 TABLET BY MOUTH EVERY DAY (Patient taking differently: Take 50 mg by mouth daily.)   . sucralfate (CARAFATE) 1 g tablet Take one tablet in the morning and one tablet in the evening. (Patient not taking: No sig reported)   . [DISCONTINUED] cetirizine (ZYRTEC) 5 MG tablet Take 2 tablets (10 mg total) by mouth daily.    No facility-administered encounter medications on file as of 06/25/2020.    Reviewed chart and adherence measures. Per insurance data patient is 40-49 % adherent to atorvastatin,100% adherent to Jardiance .   [place] Pharmacist Assistant [phone]

## 2020-06-27 ENCOUNTER — Other Ambulatory Visit: Payer: Self-pay | Admitting: Family Medicine

## 2020-06-27 ENCOUNTER — Telehealth: Payer: Self-pay

## 2020-06-27 DIAGNOSIS — E118 Type 2 diabetes mellitus with unspecified complications: Secondary | ICD-10-CM

## 2020-06-27 DIAGNOSIS — F4320 Adjustment disorder, unspecified: Secondary | ICD-10-CM

## 2020-06-27 MED ORDER — SERTRALINE HCL 50 MG PO TABS: 50.0000 mg | ORAL_TABLET | Freq: Every day | ORAL | 1 refills | Status: DC

## 2020-06-27 NOTE — Telephone Encounter (Signed)
Pt's pharmacy called in to request a refill for sertraline (ZOLOFT) 50 MG tablet     Pharmacy:  [facility] - [place], [place] - [address] Phone:  [phone]  Fax:  [phone]

## 2020-06-27 NOTE — Progress Notes (Signed)
Reached out to PCP office to request a refill of sertraline 50 MG.Per PCP office, message for request is sent to PCP.  [place] Pharmacist Assistant [phone]

## 2020-06-30 ENCOUNTER — Other Ambulatory Visit: Payer: Self-pay | Admitting: Family Medicine

## 2020-06-30 DIAGNOSIS — E118 Type 2 diabetes mellitus with unspecified complications: Secondary | ICD-10-CM

## 2020-06-30 NOTE — Telephone Encounter (Signed)
Requested Prescriptions  Pending Prescriptions Disp Refills  . JARDIANCE 25 MG TABS tablet [Pharmacy Med Name: Jardiance 25 mg tablet] 30 tablet 2    Sig: TAKE ONE TABLET BY MOUTH BEFORE BREAKFAST     Endocrinology:  Diabetes - SGLT2 Inhibitors Failed - 06/30/2020  4:45 PM      Failed - LDL in normal range and within 360 days    Ldl Cholesterol, Calc  Date Value Ref Range Status  07/14/2013 154 (H) 0 - 100 mg/dL Final   LDL Chol Calc (NIH)  Date Value Ref Range Status  01/03/2019 147 (H) 0 - 99 mg/dL Final   LDL Cholesterol  Date Value Ref Range Status  08/31/2019 163 (H) 0 - 99 mg/dL Final    Comment:           Total Cholesterol/HDL:CHD Risk Coronary Heart Disease Risk Table                     Men   Women  1/2 Average Risk   3.4   3.3  Average Risk       5.0   4.4  2 X Average Risk   9.6   7.1  3 X Average Risk  23.4   11.0        Use the calculated Patient Ratio above and the CHD Risk Table to determine the patient's CHD Risk.        ATP III CLASSIFICATION (LDL):  <100     mg/dL   Optimal  100-129  mg/dL   Near or Above                    Optimal  130-159  mg/dL   Borderline  160-189  mg/dL   High  >190     mg/dL   Very High Performed at [facility], [address] [place],  [address]          Failed - HBA1C is between 0 and 7.9 and within 180 days    Hemoglobin A1C  Date Value Ref Range Status  05/01/2020 12.2 (A) 4.0 - 5.6 % Final  07/10/2019 9.2  Final   Hgb A1c MFr Bld  Date Value Ref Range Status  08/30/2019 9.8 (H) 4.8 - 5.6 % Final    Comment:    (NOTE) Pre diabetes:          5.7%-6.4% Diabetes:              >6.4% Glycemic control for   <7.0% adults with diabetes          Passed - Cr in normal range and within 360 days    Creat  Date Value Ref Range Status  03/24/2017 1.39 (H) 0.50 - 0.99 mg/dL Final    Comment:    For patients >12 years of age, the reference limit for Creatinine is approximately 13% higher for  people identified as African-American. .    Creatinine, Ser  Date Value Ref Range Status  06/17/2020 0.64 0.44 - 1.00 mg/dL Final         Passed - AA eGFR in normal range and within 360 days    EGFR (African American)  Date Value Ref Range Status  04/29/2014 >60 >61m/min Final  09/16/2013 >60  Final   GFR calc Af Amer  Date Value Ref Range Status  02/06/2020 108 >59 mL/min/1.73 Final    Comment:    **In accordance with recommendations from the NKF-ASN Task force,**  [facility] is in the process of updating its eGFR calculation to the   2021 CKD-EPI creatinine equation that estimates kidney function   without a race variable.    EGFR (Non-African Amer.)  Date Value Ref Range Status  04/29/2014 >60 >77m/min Final    Comment:    eGFR values <682mmin/1.73 m2 may be an indication of chronic kidney disease (CKD). Calculated eGFR, using the MRDR Study equation, is useful in  patients with stable renal function. The eGFR calculation will not be reliable in acutely ill patients when serum creatinine is changing rapidly. It is not useful in patients on dialysis. The eGFR calculation may not be applicable to patients at the low and high extremes of body sizes, pregnant women, and vegetarians.   09/16/2013 >60  Final    Comment:    eGFR values <6053min/1.73 m2 may be an indication of chronic kidney disease (CKD). Calculated eGFR is useful in patients with stable renal function. The eGFR calculation will not be reliable in acutely ill patients when serum creatinine is changing rapidly. It is not useful in  patients on dialysis. The eGFR calculation may not be applicable to patients at the low and high extremes of body sizes, pregnant women, and vegetarians.    GFR, Estimated  Date Value Ref Range Status  06/17/2020 >60 >60 mL/min Final    Comment:    (NOTE) Calculated using the CKD-EPI Creatinine Equation (2021)          Passed - Valid encounter within last 6 months     Recent Outpatient Visits          1 month ago [facility]avinalJerrol BananaMD   2 months ago [facility]ph[facility]   4 months ago Type 2 diabetes mellitus with complication, [facility] long-term [facility] use [facility]   6 months ago Type 2 diabetes mellitus with complication, without long-term current use of insulin [facility]   8 months ago Gastroesophageal reflux disease, unspecified whether esophagitis present   [facility]      Future Appointments            In 3 weeks [facility]rol [facility]

## 2020-07-01 ENCOUNTER — Other Ambulatory Visit: Payer: Self-pay | Admitting: *Deleted

## 2020-07-01 DIAGNOSIS — E118 Type 2 diabetes mellitus with unspecified complications: Secondary | ICD-10-CM

## 2020-07-01 MED ORDER — TRULICITY 0.75 MG/0.5ML ~~LOC~~ SOAJ: 0.7500 mg | SUBCUTANEOUS | 4 refills | Status: DC

## 2020-07-02 NOTE — Chronic Care Management (AMB) (Signed)
  Chronic Care Management   Note  07/02/2020 Name: Candace [name] MRN: [id number] DOB: 13-Dec-1950    Brief outreach with [name]. Reports feeling well today but out with a family member at the time of the call. Requested to complete outreach on a later date. Agreeable to telephonic outreach next week. Encouraged to call with urgent care management concerns if needed prior to the scheduled outreach.   Follow up plan: Will follow up as requested next week.  [name] [phone]

## 2020-07-04 ENCOUNTER — Ambulatory Visit (INDEPENDENT_AMBULATORY_CARE_PROVIDER_SITE_OTHER): Payer: Medicare Other

## 2020-07-04 DIAGNOSIS — I5032 Chronic diastolic (congestive) heart failure: Secondary | ICD-10-CM | POA: Diagnosis not present

## 2020-07-04 DIAGNOSIS — I25110 Atherosclerotic heart disease of native coronary artery with unstable angina pectoris: Secondary | ICD-10-CM | POA: Diagnosis not present

## 2020-07-04 DIAGNOSIS — I480 Paroxysmal atrial fibrillation: Secondary | ICD-10-CM | POA: Diagnosis not present

## 2020-07-04 DIAGNOSIS — E118 Type 2 diabetes mellitus with unspecified complications: Secondary | ICD-10-CM | POA: Diagnosis not present

## 2020-07-04 DIAGNOSIS — E1159 Type 2 diabetes mellitus with other circulatory complications: Secondary | ICD-10-CM

## 2020-07-04 DIAGNOSIS — I152 Hypertension secondary to endocrine disorders: Secondary | ICD-10-CM

## 2020-07-04 NOTE — Chronic Care Management (AMB) (Signed)
Chronic Care Management   Follow Up Note   07/04/2020 Name: BAYA LENTZ MRN: [id number] DOB: 1950/10/18  Primary Care Provider: [name]., MD Reason for referral : Chronic Care Management   Candace [name] is a 70 y.o. year old female who is a primary care patient of [name]., MD.   Review of Ms. Lorenzen's status, including review of consultants reports, relevant labs and test results was conducted today. Collaboration with appropriate care team members was performed as part of the comprehensive evaluation and provision of chronic care management services.    SDOH (Social Determinants of Health) assessments performed: No     Outpatient Encounter Medications as of 07/04/2020  Medication Sig Note  . acetaminophen (TYLENOL) 325 MG tablet Take 650 mg by mouth every 6 (six) hours as needed for moderate pain or headache.    Marland Kitchen aspirin EC 81 MG EC tablet Take 1 tablet (81 mg total) by mouth daily. Swallow whole.   Marland Kitchen atorvastatin (LIPITOR) 40 MG tablet Take 1 tablet (40 mg total) by mouth daily.   . benzonatate (TESSALON) 100 MG capsule Take 1 capsule (100 mg total) by mouth 3 (three) times daily as needed for cough. (Patient not taking: No sig reported)   . bismuth subsalicylate (PEPTO BISMOL) 262 MG chewable tablet Chew 524 mg by mouth as needed.   . colchicine 0.6 MG tablet Take 1 tablet (0.6 mg total) by mouth 2 (two) times daily.   . Dulaglutide (TRULICITY) 4.09 WJ/1.9JY SOPN Inject 0.75 mg into the skin once a week.   Marland Kitchen ELIQUIS 5 MG TABS tablet Take 5 mg by mouth 2 (two) times daily. 06/06/2020: Prescribed by Dr. Nehemiah Massed  . isosorbide mononitrate (IMDUR) 60 MG 24 hr tablet Take 1 tablet (60 mg total) by mouth daily. 06/06/2020: Prescribed by Dr. Nehemiah Massed   . JARDIANCE 25 MG TABS tablet TAKE ONE TABLET BY MOUTH BEFORE BREAKFAST   . Lancets (ONETOUCH DELICA PLUS [id number]) MISC USE UP TO 4 TIMES DAILY AS DIRECTED   . loperamide (IMODIUM) 2 MG capsule Take 2 mg  by mouth as needed.   . metoprolol tartrate (LOPRESSOR) 100 MG tablet Take 100 mg by mouth 2 (two) times daily. 06/06/2020: Prescribed by Dr. Nehemiah Massed   . nitroGLYCERIN (NITROSTAT) 0.4 MG SL tablet Place 1 tablet (0.4 mg total) under the tongue every 5 (five) minutes as needed for chest pain.   Marland Kitchen nystatin (NYSTATIN) powder Apply 1 application topically daily. (Patient taking differently: Apply 1 application topically daily as needed.)   . ondansetron (ZOFRAN-ODT) 8 MG disintegrating tablet Take 8 mg by mouth every 8 (eight) hours as needed.   . pantoprazole (PROTONIX) 20 MG tablet Take 1 tablet (20 mg total) by mouth 2 (two) times daily.   . sertraline (ZOLOFT) 50 MG tablet Take 1 tablet (50 mg total) by mouth daily.   . sucralfate (CARAFATE) 1 g tablet Take one tablet in the morning and one tablet in the evening. (Patient not taking: No sig reported)   . [DISCONTINUED] cetirizine (ZYRTEC) 5 MG tablet Take 2 tablets (10 mg total) by mouth daily.    No facility-administered encounter medications on file as of 07/04/2020.     Objective:  Patient Care Plan: Diabetes Type 2 (Adult)    Problem Identified: Glycemic Management (Diabetes, Type 2)   Priority: High  Onset Date: 04/15/2020    Long-Range Goal: Glycemic Management Optimized   Start Date: 04/15/2020  Expected End Date: 08/13/2020  Priority: High  Note:   Lab Results  Component Value Date   HGBA1C 12.2 (A) 05/01/2020     Current Barriers:  . Chronic Disease Management support and educational needs r/t Diabetes self management.   Case Manager Clinical Goal(s):  Marland Kitchen Over the next 120 days, patient will demonstrate improved adherence to prescribed treatment plan for Diabetes self management as evidenced by daily monitoring and recording of CBG, adherence to ADA/ carb modified diet and adherence to prescribed medication regimen.  Interventions:  . Collaboration with Miguel Aschoff, MD regarding development and update of comprehensive  plan of care as evidenced by provider attestation and co-signature . Inter-disciplinary care team collaboration (see longitudinal plan of care) . Reviewed recent blood glucose readings and compliance with current treatment plan. She continues to have readings in the 200's. Reports taking medications as prescribed and improvement with her diet. We thoroughly discussed s/sx of hyperglycemia, recommended interventions, and indications for seeking medical attention. Strongly encouraged to monitor levels consistently and record readings to identify trends.  Liana Gerold referrals for available Diabetes education classes.  Her A1C is not at goal. She still does not feel that additional assistance is needed. Reports family has been very supportive and assists with identifying areas to improve her daily intake and overall diabetes self-management. Willing to reconsider if her A1C increases.   Patient Goals/Self-Care Activities -Self administer medications as prescribed -Attend all scheduled provider appointments -Monitor blood glucose levels consistently and utilize recommended interventions -Monitor closely for s/sx of hyperglycemia and seek medical attention if needed -Adhere to prescribed ADA/carb modified -Notify provider or care management team with questions and new concerns as needed.   Follow Up Plan:  Will follow up next month      Patient Care Plan: Heart Failure (Adult)    Problem Identified: Symptom Exacerbation (Heart Failure)   Priority: High  Onset Date: 04/15/2020    Long-Range Goal: Symptom Exacerbation Prevented or Minimized   Start Date: 04/15/2020  Expected End Date: 08/13/2020  Priority: High  Note:   Current Barriers:  . Chronic Disease Management support and educational needs r/t CHF.  Case Manager Clinical Goal(s):  Marland Kitchen Over the next 120 days, patient will not require hospitalization or emergent evaluation d/t complications r/t CHF exacerbation.  Interventions:   . Collaboration with [name]., MD regarding development and update of comprehensive plan of care as evidenced by provider attestation and co-signature . Inter-disciplinary care team collaboration (see longitudinal plan of care) . Reviewed medications and discussed current plan for CHF management. Encouraged to continue taking medications as prescribed. Discussed weight parameters and indications for notifying a provider. Reviewed symptoms r/t fluid overload. Encouraged to monitor weight and record readings. Advised to notify a provider for weight gain greater than 3 lbs overnight or greater than 5 lbs within a week. Reports weights have been stable. Weight today was 165 lbs. . Reviewed s/sx of complications r/t CHF exacerbation. Encouraged to continue assessing daily and notify a provider for concerning symptoms. She has noticed changes in her activity tolerance and feels that she tires easily. Also noticed that she is unable to sleep during the night d/t being unable to lay flat for a prolonged period. Reports having to sit up more. Denies chest pain, palpitations or shortness of breath. She is scheduled to follow up with the Cardiology team on 07/07/20 and plans to further discuss during the visit. Discussed worsening symptoms that require immediate medical attention.  . Discussed diet/nutritional intake.  Encouraged to continue monitoring nutritional labels  and closely monitoring sodium intake.   Patient Goals/Self-Care Activities:  -Take medications as prescribed -Follow plan for CHF symptom management and notify provider with concerns -Monitor and record weights -Adhere to recommended cardiac diet -Attend Cardiology follow up as scheduled    Follow Up Plan:  Will follow up next month        Patient Care Plan: Hypertension (Adult)    Problem Identified: Hypertension (Hypertension)     Long-Range Goal: Hypertension Monitored   Start Date: 04/15/2020  Expected End Date:  08/13/2020  Priority: High  Note:    Objective:  BP Readings from Last 3 Encounters:  06/18/20 (!) 101/49  05/13/20 (!) 166/95  05/01/20 121/71    Current Barriers:  . Chronic Disease Management support and educational needs r/t Hypertension.  Case Manager Clinical Goal(s):  Marland Kitchen Over the next 120 days, patient will demonstrate improved adherence to prescribed treatment plan for hypertension as evidenced by taking all medications as prescribed, monitoring and recording blood pressure and adhering to a cardiac prudent/heart healthy diet.   Interventions:  . Collaboration with [name]., MD regarding development and update of comprehensive plan of care as evidenced by provider attestation and co-signature . Inter-disciplinary care team collaboration (see longitudinal plan of care) . Reviewed current treatment plan. Reports compliance with medications and diet. Home BP readings have been within range with recent reading of 130/70. Encouraged to continue monitoring and notify provider for readings outside of the established range.  . Discussed complications of uncontrolled blood pressure. Reviewed s/sx of heart attack, stroke and worsening symptoms that require immediate medical attention.   Patient Goals/Self-Care Activities: -Self administer medications as prescribed -Attend all scheduled provider appointments -Monitor and record blood pressure -Adhere to recommended cardiac prudent/heart healthy diet -Notify provider or care management team with questions and new concerns as needed    Follow Up Plan:  Will follow up next month          PLAN A member of the care management team will follow-up with Ms. Zimmermann next month.    [name] Care Management [facility] [phone]

## 2020-07-07 NOTE — Patient Instructions (Signed)
Thank you for allowing the Chronic Care Management team to participate in your care. It was a pleasure speaking with you today. Please feel free to contact me with questions.  Goals Addressed: Patient Care Plan: Diabetes Type 2 (Adult)    Problem Identified: Glycemic Management (Diabetes, Type 2)   Priority: High  Onset Date: 04/15/2020    Long-Range Goal: Glycemic Management Optimized   Start Date: 04/15/2020  Expected End Date: 08/13/2020  Priority: High  Note:   Lab Results  Component Value Date   HGBA1C 12.2 (A) 05/01/2020     Current Barriers:  . Chronic Disease Management support and educational needs r/t Diabetes self management.   Case Manager Clinical Goal(s):  Marland Kitchen Over the next 120 days, patient will demonstrate improved adherence to prescribed treatment plan for Diabetes self management as evidenced by daily monitoring and recording of CBG, adherence to ADA/ carb modified diet and adherence to prescribed medication regimen.  Interventions:  . Collaboration with Miguel Aschoff, MD regarding development and update of comprehensive plan of care as evidenced by provider attestation and co-signature . Inter-disciplinary care team collaboration (see longitudinal plan of care) . Reviewed recent blood glucose readings and compliance with current treatment plan. She continues to have readings in the 200's. Reports taking medications as prescribed and improvement with her diet. We thoroughly discussed s/sx of hyperglycemia, recommended interventions, and indications for seeking medical attention. Strongly encouraged to monitor levels consistently and record readings to identify trends.  Liana Gerold referrals for available Diabetes education classes.  Her A1C is not at goal. She still does not feel that additional assistance is needed. Reports family has been very supportive and assists with identifying areas to improve her daily intake and overall diabetes self-management. Willing to  reconsider if her A1C increases.   Patient Goals/Self-Care Activities -Self administer medications as prescribed -Attend all scheduled provider appointments -Monitor blood glucose levels consistently and utilize recommended interventions -Monitor closely for s/sx of hyperglycemia and seek medical attention if needed -Adhere to prescribed ADA/carb modified -Notify provider or care management team with questions and new concerns as needed.   Follow Up Plan:  Will follow up next month      Patient Care Plan: Heart Failure (Adult)    Problem Identified: Symptom Exacerbation (Heart Failure)   Priority: High  Onset Date: 04/15/2020    Long-Range Goal: Symptom Exacerbation Prevented or Minimized   Start Date: 04/15/2020  Expected End Date: 08/13/2020  Priority: High  Note:   Current Barriers:  . Chronic Disease Management support and educational needs r/t CHF.  Case Manager Clinical Goal(s):  Marland Kitchen Over the next 120 days, patient will not require hospitalization or emergent evaluation d/t complications r/t CHF exacerbation.  Interventions:  . Collaboration with [name]., MD regarding development and update of comprehensive plan of care as evidenced by provider attestation and co-signature . Inter-disciplinary care team collaboration (see longitudinal plan of care) . Reviewed medications and discussed current plan for CHF management. Encouraged to continue taking medications as prescribed. Discussed weight parameters and indications for notifying a provider. Reviewed symptoms r/t fluid overload. Encouraged to monitor weight and record readings. Advised to notify a provider for weight gain greater than 3 lbs overnight or greater than 5 lbs within a week. Reports weights have been stable. Weight today was 165 lbs. . Reviewed s/sx of complications r/t CHF exacerbation. Encouraged to continue assessing daily and notify a provider for concerning symptoms. She has noticed changes in  her activity tolerance and  feels that she tires easily. Also noticed that she is unable to sleep during the night d/t being unable to lay flat for a prolonged period. Reports having to sit up more. Denies chest pain, palpitations or shortness of breath. She is scheduled to follow up with the Cardiology team on 07/07/20 and plans to further discuss during the visit. Discussed worsening symptoms that require immediate medical attention.  . Discussed diet/nutritional intake.  Encouraged to continue monitoring nutritional labels and closely monitoring sodium intake.   Patient Goals/Self-Care Activities:  -Take medications as prescribed -Follow plan for CHF symptom management and notify provider with concerns -Monitor and record weights -Adhere to recommended cardiac diet -Attend Cardiology follow up as scheduled    Follow Up Plan:  Will follow up next month        Patient Care Plan: Hypertension (Adult)    Problem Identified: Hypertension (Hypertension)     Long-Range Goal: Hypertension Monitored   Start Date: 04/15/2020  Expected End Date: 08/13/2020  Priority: High  Note:    Objective:  BP Readings from Last 3 Encounters:  06/18/20 (!) 101/49  05/13/20 (!) 166/95  05/01/20 121/71    Current Barriers:  . Chronic Disease Management support and educational needs r/t Hypertension.  Case Manager Clinical Goal(s):  Marland Kitchen Over the next 120 days, patient will demonstrate improved adherence to prescribed treatment plan for hypertension as evidenced by taking all medications as prescribed, monitoring and recording blood pressure and adhering to a cardiac prudent/heart healthy diet.   Interventions:  . Collaboration with [name]., MD regarding development and update of comprehensive plan of care as evidenced by provider attestation and co-signature . Inter-disciplinary care team collaboration (see longitudinal plan of care) . Reviewed current treatment plan. Reports  compliance with medications and diet. Home BP readings have been within range with recent reading of 130/70. Encouraged to continue monitoring and notify provider for readings outside of the established range.  . Discussed complications of uncontrolled blood pressure. Reviewed s/sx of heart attack, stroke and worsening symptoms that require immediate medical attention.   Patient Goals/Self-Care Activities: -Self administer medications as prescribed -Attend all scheduled provider appointments -Monitor and record blood pressure -Adhere to recommended cardiac prudent/heart healthy diet -Notify provider or care management team with questions and new concerns as needed    Follow Up Plan:  Will follow up next month           Ms. Christenberry verbalized understanding of the information discussed during the telephonic outreach today. Declined need for mailed/printed instructions. A member of the care management team will follow-up with Ms. Toback next month.    [name] Care Management [facility] [phone]

## 2020-07-08 ENCOUNTER — Other Ambulatory Visit: Payer: Self-pay | Admitting: Family Medicine

## 2020-07-08 ENCOUNTER — Ambulatory Visit: Payer: Self-pay

## 2020-07-08 DIAGNOSIS — I5032 Chronic diastolic (congestive) heart failure: Secondary | ICD-10-CM

## 2020-07-08 DIAGNOSIS — I25110 Atherosclerotic heart disease of native coronary artery with unstable angina pectoris: Secondary | ICD-10-CM

## 2020-07-08 NOTE — Chronic Care Management (AMB) (Signed)
  Chronic Care Management   Note  07/08/2020 Name: Candace [name] MRN: [id number] DOB: 07/31/1950    Brief update with Ms. Dotzler regarding changes to her medication regimen. She attended her cardiology visit with Dr. Nehemiah Massed as scheduled on yesterday. Reports Ranexa was ordered. She was in route to pick up the prescription at the time of the call. She will update the care management team with concerns regarding medication management or prescription cost.   Follow up plan: Will follow-up as scheduled next month   [name] Care Management [facility] [phone]

## 2020-07-09 ENCOUNTER — Ambulatory Visit: Payer: Self-pay | Admitting: *Deleted

## 2020-07-09 ENCOUNTER — Telehealth: Payer: Self-pay

## 2020-07-09 NOTE — Telephone Encounter (Signed)
  C/o dizziness, blurry vision, nausea, clammy skin, sweating, tightness in chest. Started 3 hours ago . Patient started on new medication today ranolazine per cardiology and started feeling worse. Patient checked blood glucose for 242. B/P 126/70 HR 92. Chest pain is constant and is not better. Instructed patient to go to ED. Care advise given. Patient verbalized understanding of care advise and to go to ED now or call 911 if symptoms worsen.   Reason for Disposition . [1] Chest pain (or "angina") comes and goes AND [2] is happening more often (increasing in frequency) or getting worse (increasing in severity) (Exception: chest pains that last only a few seconds)  Answer Assessment - Initial Assessment Questions 1. LOCATION: "Where does it hurt?"       Left side of chest  2. RADIATION: "Does the pain go anywhere else?" (e.g., into neck, jaw, arms, back)     No more than usual  3. ONSET: "When did the chest pain begin?" (Minutes, hours or days)      3 hours ago  4. PATTERN "Does the pain come and go, or has it been constant since it started?"  "Does it get worse with exertion?"      Constant today , comes and goes now 5. DURATION: "How long does it last" (e.g., seconds, minutes, hours)     Minutes  6. SEVERITY: "How bad is the pain?"  (e.g., Scale 1-10; mild, moderate, or severe)    - MILD (1-3): doesn't interfere with normal activities     - MODERATE (4-7): interferes with normal activities or awakens from sleep    - SEVERE (8-10): excruciating pain, unable to do any normal activities       Moderate  7. CARDIAC RISK FACTORS: "Do you have any history of heart problems or risk factors for heart disease?" (e.g., angina, prior heart attack; diabetes, high blood pressure, high cholesterol, smoker, or strong family history of heart disease)     CHF and heart disease , angina. 8. PULMONARY RISK FACTORS: "Do you have any history of lung disease?"  (e.g., blood clots in lung, asthma, emphysema, birth  control pills)     Na  9. CAUSE: "What do you think is causing the chest pain?"     Not sure  10. OTHER SYMPTOMS: "Do you have any other symptoms?" (e.g., dizziness, nausea, vomiting, sweating, fever, difficulty breathing, cough)       Dizziness, nausea, clammy skin, chest tightness SOB. 11. PREGNANCY: "Is there any chance you are pregnant?" "When was your last menstrual period?"       na  Protocols used: CHEST PAIN-A-AH

## 2020-07-09 NOTE — Progress Notes (Signed)
Chronic Care Management Pharmacy Assistant   Name: Candace [name]  MRN: [id number] DOB: 1950-08-28  Reason for Encounter:Diabetes Disease State Call   Recent office visits:  07/04/2020 CCM Felecia McCray  Recent consult visits:  No recent [facility] visits:  Medication Reconciliation was completed by comparing discharge summary, patient's EMR and Pharmacy list, and upon discussion with patient.  Admitted to the hospital on 06/17/2020 due to Chest Pain. Discharge date was 06/18/2020. Discharged from [facility]?Medications Started at [facility] Discharge:?? -started Aspirin 81 MG   Medication Changes at Hospital Discharge: -Changed none  Medications Discontinued at Hospital Discharge: -Stopped none   Medications that remain the same after Hospital Discharge:??  -All other medications will remain the same.    Medications: Outpatient Encounter Medications as of 07/09/2020  Medication Sig Note  . acetaminophen (TYLENOL) 325 MG tablet Take 650 mg by mouth every 6 (six) hours as needed for moderate pain or headache.    Marland Kitchen aspirin EC 81 MG EC tablet Take 1 tablet (81 mg total) by mouth daily. Swallow whole.   Marland Kitchen atorvastatin (LIPITOR) 40 MG tablet Take 1 tablet (40 mg total) by mouth daily.   . benzonatate (TESSALON) 100 MG capsule Take 1 capsule (100 mg total) by mouth 3 (three) times daily as needed for cough. (Patient not taking: No sig reported)   . bismuth subsalicylate (PEPTO BISMOL) 262 MG chewable tablet Chew 524 mg by mouth as needed.   . colchicine 0.6 MG tablet Take 1 tablet (0.6 mg total) by mouth 2 (two) times daily.   . Dulaglutide (TRULICITY) 2.42 PN/3.6RW SOPN Inject 0.75 mg into the skin once a week.   Marland Kitchen ELIQUIS 5 MG TABS tablet Take 5 mg by mouth 2 (two) times daily. 06/06/2020: Prescribed by Dr. Nehemiah Massed  . isosorbide mononitrate (IMDUR) 60 MG 24 hr tablet Take 1 tablet (60 mg total) by mouth daily. 06/06/2020: Prescribed by Dr.  Nehemiah Massed   . JARDIANCE 25 MG TABS tablet TAKE ONE TABLET BY MOUTH BEFORE BREAKFAST   . Lancets (ONETOUCH DELICA PLUS [id number]) MISC USE UP TO 4 TIMES DAILY AS DIRECTED   . loperamide (IMODIUM) 2 MG capsule Take 2 mg by mouth as needed.   . metoprolol tartrate (LOPRESSOR) 100 MG tablet Take 100 mg by mouth 2 (two) times daily. 06/06/2020: Prescribed by Dr. Nehemiah Massed   . nitroGLYCERIN (NITROSTAT) 0.4 MG SL tablet Place 1 tablet (0.4 mg total) under the tongue every 5 (five) minutes as needed for chest pain.   Marland Kitchen nystatin (NYSTATIN) powder Apply 1 application topically daily. (Patient taking differently: Apply 1 application topically daily as needed.)   . ondansetron (ZOFRAN-ODT) 8 MG disintegrating tablet Take 8 mg by mouth every 8 (eight) hours as needed.   . pantoprazole (PROTONIX) 20 MG tablet Take 1 tablet (20 mg total) by mouth 2 (two) times daily.   . sertraline (ZOLOFT) 50 MG tablet Take 1 tablet (50 mg total) by mouth daily.   . sucralfate (CARAFATE) 1 g tablet Take one tablet in the morning and one tablet in the evening. (Patient not taking: No sig reported)   . [DISCONTINUED] cetirizine (ZYRTEC) 5 MG tablet Take 2 tablets (10 mg total) by mouth daily.    No facility-administered encounter medications on file as of 07/09/2020.   Star Rating Drugs: Atorvastatin 40 mg last filled on 06/18/2020 for 30 day supply at [facility]. Trulicity 7.61 mg last filled on 07/01/2020 for 28 day supply  at [facility] Jardiance 25 mg last filled on 06/30/2020 for 80 day supply at [facility].  Recent Relevant Labs: Lab Results  Component Value Date/Time   HGBA1C 12.2 (A) 05/01/2020 09:55 AM   HGBA1C 12.4 (A) 12/06/2019 11:35 AM   HGBA1C 9.8 (H) 08/30/2019 07:15 AM   HGBA1C 9.2 07/10/2019 12:00 AM   HGBA1C 9.5 (H) 03/06/2019 07:25 PM   HGBA1C 8.3 (H) 07/14/2013 05:42 AM    Kidney Function Lab Results  Component Value Date/Time   CREATININE 0.64 06/17/2020 09:20 AM   CREATININE 0.60  02/06/2020 09:49 AM   CREATININE 1.39 (H) 03/24/2017 04:38 PM   CREATININE 0.78 04/29/2014 08:42 PM   CREATININE 0.79 02/03/2014 04:06 PM   GFRNONAA >60 06/17/2020 09:20 AM   GFRNONAA >60 04/29/2014 08:42 PM   GFRNONAA >60 09/16/2013 05:23 AM   GFRAA 108 02/06/2020 09:49 AM   GFRAA >60 04/29/2014 08:42 PM   GFRAA >60 09/16/2013 05:23 AM    . Current antihyperglycemic regimen:   Trulicity 2.54 mg weekly on Tuesdays  Jardiance 25 mg daily  . What recent interventions/DTPs have been made to improve glycemic control:  o None ID . Have there been any recent hospitalizations or ED visits since last visit with CPP? Yes . Patient denies hypoglycemic symptoms, including Pale, Sweaty, Shaky, Hungry, Nervous/irritable and Vision changes . Patient denies hyperglycemic symptoms, including blurry vision, excessive thirst, fatigue, polyuria and weakness . How often are you checking your blood sugar? once daily . What are your blood sugars ranging?  o Fasting: N/A o Before meals:  - On 07/09/2020 it was 190. - On 07/08/2020 it was 203. - On 07/07/2020 it was 177. - On 07/06/2020 it was 183. o After meals: N/A o Bedtime: N/A . During the week, how often does your blood glucose drop below 70? Never . Are you checking your feet daily/regularly?   Patient denies pain,numbness,or tingling in the bottom of her feet.  Adherence Review: Is the patient currently on a STATIN medication? Yes Is the patient currently on ACE/ARB medication? No Does the patient have >5 day gap between last estimated fill dates? No  Anderson Malta Clinical Production designer, theatre/television/film [phone]

## 2020-07-10 DIAGNOSIS — Z951 Presence of aortocoronary bypass graft: Secondary | ICD-10-CM | POA: Insufficient documentation

## 2020-07-10 DIAGNOSIS — I480 Paroxysmal atrial fibrillation: Secondary | ICD-10-CM | POA: Insufficient documentation

## 2020-07-10 DIAGNOSIS — I10 Essential (primary) hypertension: Secondary | ICD-10-CM | POA: Insufficient documentation

## 2020-07-10 NOTE — Progress Notes (Signed)
Spoke with patient, Patient states she started ranolazine per cardiology on 07/09/2020.Patient states she is having blurry vision,nauesa,chest pain and dizziness after she took ranolazine.Patient is concern she is having a side effect from ranolazine. Patient reports she is currently at [facility] to be seen.Notfied clinical Pharmacist.   Anderson Malta Clinical Pharmacist Assistant [phone]

## 2020-07-16 ENCOUNTER — Telehealth: Payer: Self-pay

## 2020-07-16 NOTE — Telephone Encounter (Signed)
Copied from [place] [id number]. Topic: Quick Communication - Rx Refill/Question >> Jul 16, 2020 11:15 AM Erick Blinks wrote: Best contact: [phone]  Pt has medication questions for Alex the pharmacist, requesting a call back to discuss.

## 2020-07-17 ENCOUNTER — Ambulatory Visit: Payer: Self-pay

## 2020-07-17 DIAGNOSIS — E118 Type 2 diabetes mellitus with unspecified complications: Secondary | ICD-10-CM

## 2020-07-17 DIAGNOSIS — I480 Paroxysmal atrial fibrillation: Secondary | ICD-10-CM

## 2020-07-17 DIAGNOSIS — I152 Hypertension secondary to endocrine disorders: Secondary | ICD-10-CM

## 2020-07-17 DIAGNOSIS — E1159 Type 2 diabetes mellitus with other circulatory complications: Secondary | ICD-10-CM

## 2020-07-17 DIAGNOSIS — I25110 Atherosclerotic heart disease of native coronary artery with unstable angina pectoris: Secondary | ICD-10-CM

## 2020-07-17 DIAGNOSIS — I5032 Chronic diastolic (congestive) heart failure: Secondary | ICD-10-CM

## 2020-07-17 NOTE — Chronic Care Management (AMB) (Signed)
  Chronic Care Management   Note  07/17/2020 Name: Candace [name] MRN: [id number] DOB: 1950/08/01   Received call from [name]. Requested assistance with verifying her pending follow-up appointments.   Appointments reviewed. She will complete a Podiatry visit on 07/22/20. She was informed that the pending cardiology stress test on 07/29/20 will be cancelled. She will be evaluated by the Cardiology team on the afternoon on 07/22/20.  She will follow up for a clinic visit with her primary care provider on 07/23/20.  Denies concerns regarding transportation. Agreed to call if additional assistance is needed.    Follow up plan: Will complete care management outreach within the next two weeks.    [name] Care Management [facility] [phone]

## 2020-07-19 ENCOUNTER — Other Ambulatory Visit: Payer: Self-pay | Admitting: Family Medicine

## 2020-07-19 DIAGNOSIS — E118 Type 2 diabetes mellitus with unspecified complications: Secondary | ICD-10-CM

## 2020-07-19 NOTE — Telephone Encounter (Signed)
Requested Prescriptions  Pending Prescriptions Disp Refills  . TRULICITY 7.03 JK/0.9FG SOPN [Pharmacy Med Name: Trulicity 1.82 XH/3.7 mL subcutaneous pen injector] 0.5 mL 4    Sig: INJECT 0.75 MG into [place]     Endocrinology:  Diabetes - GLP-1 Receptor Agonists Failed - 07/19/2020  8:03 AM      Failed - HBA1C is between 0 and 7.9 and within 180 days    Hemoglobin A1C  Date Value Ref Range Status  05/01/2020 12.2 (A) 4.0 - 5.6 % Final  07/10/2019 9.2  Final   Hgb A1c MFr Bld  Date Value Ref Range Status  08/30/2019 9.8 (H) 4.8 - 5.6 % Final    Comment:    (NOTE) Pre diabetes:          5.7%-6.4% Diabetes:              >6.4% Glycemic control for   <7.0% adults with diabetes          Passed - Valid encounter within last 6 months    Recent Outpatient Visits          2 months ago [place] [name]., MD   2 months ago Esophageal dysphagia   [facility] [name]., MD   5 months ago Type 2 diabetes mellitus with complication, without long-term current use of insulin [facility])   [facility] [name]., MD   7 months ago Type 2 diabetes mellitus with complication, without long-term current use of insulin [facility])   [facility] [name]., MD   9 months ago Gastroesophageal reflux disease, unspecified whether esophagitis present   [facility] [name]., MD      Future Appointments            In 4 days [name]., MD [facility], [place]   In 3 weeks [name]., MD [facility]

## 2020-07-23 ENCOUNTER — Ambulatory Visit: Payer: Self-pay

## 2020-07-23 ENCOUNTER — Inpatient Hospital Stay: Payer: Medicare Other | Admitting: Family Medicine

## 2020-07-23 DIAGNOSIS — Z20822 Contact with and (suspected) exposure to covid-19: Secondary | ICD-10-CM

## 2020-07-23 DIAGNOSIS — I25110 Atherosclerotic heart disease of native coronary artery with unstable angina pectoris: Secondary | ICD-10-CM

## 2020-07-23 DIAGNOSIS — E118 Type 2 diabetes mellitus with unspecified complications: Secondary | ICD-10-CM | POA: Diagnosis not present

## 2020-07-23 DIAGNOSIS — I5032 Chronic diastolic (congestive) heart failure: Secondary | ICD-10-CM

## 2020-07-23 DIAGNOSIS — I480 Paroxysmal atrial fibrillation: Secondary | ICD-10-CM | POA: Diagnosis not present

## 2020-07-23 DIAGNOSIS — E1159 Type 2 diabetes mellitus with other circulatory complications: Secondary | ICD-10-CM

## 2020-07-23 DIAGNOSIS — I152 Hypertension secondary to endocrine disorders: Secondary | ICD-10-CM

## 2020-07-23 NOTE — Chronic Care Management (AMB) (Signed)
Chronic Care Management   Follow Up Note   07/23/2020 Name: Candace [name] MRN: [id number] DOB: [birth year]  Primary Care Provider: [name]., MD Reason for referral : Chronic Care Management   Candace [name] is a 70 y.o. year old female who is a primary care patient of [name]., MD. She is currently enrolled in the Chronic Care Management program. Engaged today via telephone.  Review of Candace [name] status, including review of consultants reports, relevant labs and test results was conducted today. Collaboration with appropriate care team members was performed as part of the comprehensive evaluation and provision of chronic care management services.    SDOH (Social Determinants of Health) assessments performed: No     Outpatient Encounter Medications as of 07/23/2020  Medication Sig Note  . acetaminophen (TYLENOL) 325 MG tablet Take 650 mg by mouth every 6 (six) hours as needed for moderate pain or headache.    Marland Kitchen aspirin EC 81 MG EC tablet Take 1 tablet (81 mg total) by mouth daily. Swallow whole.   Marland Kitchen atorvastatin (LIPITOR) 40 MG tablet Take 1 tablet (40 mg total) by mouth daily.   . benzonatate (TESSALON) 100 MG capsule Take 1 capsule (100 mg total) by mouth 3 (three) times daily as needed for cough. (Patient not taking: No sig reported)   . bismuth subsalicylate (PEPTO BISMOL) 262 MG chewable tablet Chew 524 mg by mouth as needed.   . colchicine 0.6 MG tablet Take 1 tablet (0.6 mg total) by mouth 2 (two) times daily.   Marland Kitchen ELIQUIS 5 MG TABS tablet Take 5 mg by mouth 2 (two) times daily. 06/06/2020: Prescribed by Dr. Nehemiah Massed  . isosorbide mononitrate (IMDUR) 60 MG 24 hr tablet Take 1 tablet (60 mg total) by mouth daily. 06/06/2020: Prescribed by Dr. Nehemiah Massed   . JARDIANCE 25 MG TABS tablet TAKE ONE TABLET BY MOUTH BEFORE BREAKFAST   . Lancets (ONETOUCH DELICA PLUS [id number]) MISC USE UP TO 4 TIMES DAILY AS DIRECTED   . loperamide (IMODIUM) 2 MG capsule Take  2 mg by mouth as needed.   . metoprolol tartrate (LOPRESSOR) 100 MG tablet Take 100 mg by mouth 2 (two) times daily. 06/06/2020: Prescribed by Dr. Nehemiah Massed   . nitroGLYCERIN (NITROSTAT) 0.4 MG SL tablet Place 1 tablet (0.4 mg total) under the tongue every 5 (five) minutes as needed for chest pain.   Marland Kitchen nystatin (NYSTATIN) powder Apply 1 application topically daily. (Patient taking differently: Apply 1 application topically daily as needed.)   . ondansetron (ZOFRAN-ODT) 8 MG disintegrating tablet Take 8 mg by mouth every 8 (eight) hours as needed.   . pantoprazole (PROTONIX) 20 MG tablet Take 1 tablet (20 mg total) by mouth 2 (two) times daily.   . sertraline (ZOLOFT) 50 MG tablet Take 1 tablet (50 mg total) by mouth daily.   . sucralfate (CARAFATE) 1 g tablet Take one tablet in the morning and one tablet in the evening. (Patient not taking: No sig reported)   . TRULICITY 1.44 YJ/8.5UD SOPN INJECT 0.75 MG into THE SKIN ONCE A WEEK   . [DISCONTINUED] cetirizine (ZYRTEC) 5 MG tablet Take 2 tablets (10 mg total) by mouth daily.    No facility-administered encounter medications on file as of 07/23/2020.     Objective:     Care Plan: Covid Exposure    Problem Identified: Covid Exposure     Goal: Symptom Exacerbation Prevented or Minimized   Start Date: 07/23/2020  Expected End Date: 08/22/2020  Priority: High  Note:    Current Barriers:  . Disease Management needs related to COVID-19 symptom management.   Clinical Goal(s):  Marland Kitchen Over the next 30 days, patient will not require hospitalization or emergent care d/t complications r/t [id number] exposure.   Interventions:  . Collaboration with [name]., MD regarding development and update of comprehensive plan of care as evidenced by provider attestation and co-signature . Inter-disciplinary care team collaboration (see longitudinal plan of care) . Provided education to enhance basic understanding of COVID-19 as a viral disease.  Discussed recommended protocol to quarantine at home and observe for new symptoms. . Confirmed administration of Covid-19 vaccinations and recommended booster. . Discussed current condition and suspected onset of symptoms. Reports feeling well today. Suspects being exposed last week and notes that symptoms started over the weekend. Experienced fevers, chills, weakness and fatigue. Notes a low grade fever of 99 within the last 24 hours. Still experiencing fatigue but reports the most of the symptoms have resolved. Thoroughly reviewed recommendations for symptom management and indications for seeking immediate medical attention.   Patient Goals/Self-Care Activities -Self-administer medications as prescribed -Follow recommended quarantine protocol and monitor symptoms daily -Seek immediate medical attention if symptoms worsen -Call provider for new concerns or questions as needed    Follow Up Plan:  Will follow-up within the next two weeks        PLAN A member of the care management team will follow up with Candace [name] in two weeks.    [name] Care Management [facility] [phone]

## 2020-07-29 ENCOUNTER — Other Ambulatory Visit: Payer: Self-pay

## 2020-07-29 ENCOUNTER — Inpatient Hospital Stay: Payer: Medicaid Other | Admitting: Family Medicine

## 2020-07-29 MED ORDER — ONDANSETRON 8 MG PO TBDP: 8.0000 mg | ORAL_TABLET | Freq: Three times a day (TID) | ORAL | 1 refills | Status: DC | PRN

## 2020-07-29 NOTE — Telephone Encounter (Signed)
Ok to send in something for nausea per Dr Rosanna Randy.

## 2020-07-29 NOTE — Patient Instructions (Signed)
  Thank you for allowing the Chronic Care Management team to participate in your care. It was a pleasure speaking with you. Please feel free to contact me with questions.    Goals Addressed:   Care Plan: Covid Exposure    Problem Identified: Covid Exposure     Goal: Symptom Exacerbation Prevented or Minimized   Start Date: 07/23/2020  Expected End Date: 08/22/2020  Priority: High  Note:    Current Barriers:  . Disease Management needs related to COVID-19 symptom management.   Clinical Goal(s):  Marland Kitchen Over the next 30 days, patient will not require hospitalization or emergent care d/t complications r/t [id number] exposure.   Interventions:  . Collaboration with [name]., MD regarding development and update of comprehensive plan of care as evidenced by provider attestation and co-signature . Inter-disciplinary care team collaboration (see longitudinal plan of care) . Provided education to enhance basic understanding of COVID-19 as a viral disease. Discussed recommended protocol to quarantine at home and observe for new symptoms. . Confirmed administration of Covid-19 vaccinations and recommended booster. . Discussed current condition and suspected onset of symptoms. Reports feeling well today. Suspects being exposed last week and notes that symptoms started over the weekend. Experienced fevers, chills, weakness and fatigue. Notes a low grade fever of 99 within the last 24 hours. Still experiencing fatigue but reports the most of the symptoms have resolved. Thoroughly reviewed recommendations for symptom management and indications for seeking immediate medical attention.   Patient Goals/Self-Care Activities -Self-administer medications as prescribed -Follow recommended quarantine protocol and monitor symptoms daily -Seek immediate medical attention if symptoms worsen -Call provider for new concerns or questions as needed    Follow Up Plan:  Will follow-up within the next  two weeks       Ms. Tranchina verbalized understanding of the information discussed during the telephonic outreach today. Declined need for mailed/printed instructions. A member of the care management team will follow up in two weeks.    [name] Care Management [facility] [phone]

## 2020-08-01 ENCOUNTER — Other Ambulatory Visit: Payer: Self-pay

## 2020-08-01 ENCOUNTER — Encounter: Payer: Medicare Other | Attending: Internal Medicine | Admitting: *Deleted

## 2020-08-01 DIAGNOSIS — I214 Non-ST elevation (NSTEMI) myocardial infarction: Secondary | ICD-10-CM | POA: Insufficient documentation

## 2020-08-01 NOTE — Progress Notes (Signed)
Initial telephone orientation completed. Diagnosis can be found in [facility] 4/13. EP orientation scheduled for Tuesday 5/10 at 8am.

## 2020-08-05 ENCOUNTER — Encounter: Payer: Medicare Other | Admitting: *Deleted

## 2020-08-05 ENCOUNTER — Other Ambulatory Visit: Payer: Self-pay

## 2020-08-05 VITALS — Ht 62.1 in | Wt 166.0 lb

## 2020-08-05 DIAGNOSIS — I214 Non-ST elevation (NSTEMI) myocardial infarction: Secondary | ICD-10-CM

## 2020-08-05 NOTE — Patient Instructions (Addendum)
Patient Instructions  Patient Details  Name: Candace [name] MRN: [id number] Date of Birth: 24-Aug-1950 Referring Provider:  Corey Skains, MD  Below are your personal goals for exercise, nutrition, and risk factors. Our goal is to help you stay on track towards obtaining and maintaining these goals. We will be discussing your progress on these goals with you throughout the program.  Initial Exercise Prescription:  Initial Exercise Prescription - 08/05/20 0900      Date of Initial Exercise RX and Referring Provider   Date 08/05/20    Referring Provider [name]      NuStep   Level 2    SPM 80    Minutes 15    METs 2.5      Arm Ergometer   Level 1    Watts 25    RPM 25    Minutes 15    METs 2      Track   Laps 25    Minutes 15    METs 2.4      Prescription Details   Frequency (times per week) 3    Duration Progress to 30 minutes of continuous aerobic without signs/symptoms of physical distress      Intensity   THRR 40-80% of Max Heartrate 111-138    Ratings of Perceived Exertion 11-13    Perceived Dyspnea 0-4      Progression   Progression Continue to progress workloads to maintain intensity without signs/symptoms of physical distress.      Resistance Training   Training Prescription Yes    Weight 3 lb    Reps 10-15           Exercise Goals: Frequency: Be able to perform aerobic exercise two to three times per week in program working toward 2-5 days per week of home exercise.  Intensity: Work with a perceived exertion of 11 (fairly light) - 15 (hard) while following your exercise prescription.  We will make changes to your prescription with you as you progress through the program.   Duration: Be able to do 30 to 45 minutes of continuous aerobic exercise in addition to a 5 minute warm-up and a 5 minute cool-down routine.   Nutrition Goals: Your personal nutrition goals will be established when you do your nutrition analysis with the  dietician.  The following are general nutrition guidelines to follow: Cholesterol < 200mg /day Sodium < 1500mg /day Fiber: Women over 50 yrs - 21 grams per day  Personal Goals:  Personal Goals and Risk Factors at Admission - 08/05/20 0910      Core Components/Risk Factors/Patient Goals on Admission    Weight Management Yes;Obesity;Weight Loss    Intervention Weight Management: Develop a combined nutrition and exercise program designed to reach desired caloric intake, while maintaining appropriate intake of nutrient and fiber, sodium and fats, and appropriate energy expenditure required for the weight goal.;Weight Management: Provide education and appropriate resources to help participant work on and attain dietary goals.;Weight Management/Obesity: Establish reasonable short term and long term weight goals.;Obesity: Provide education and appropriate resources to help participant work on and attain dietary goals.    Admit Weight 166 lb (75.3 kg)    Goal Weight: Short Term 160 lb (72.6 kg)    Goal Weight: Long Term 155 lb (70.3 kg)    Expected Outcomes Short Term: Continue to assess and modify interventions until short term weight is achieved;Long Term: Adherence to nutrition and physical activity/exercise program aimed toward attainment of established weight goal;Weight Loss: Understanding  of general recommendations for a balanced deficit meal plan, which promotes 1-2 lb weight loss per week and includes a negative energy balance of [id number] kcal/d;Understanding recommendations for meals to include 15-35% energy as protein, 25-35% energy from fat, 35-60% energy from carbohydrates, less than 200mg  of dietary cholesterol, 20-35 gm of total fiber daily;Understanding of distribution of calorie intake throughout the day with the consumption of 4-5 meals/snacks    Diabetes Yes    Intervention Provide education about signs/symptoms and action to take for hypo/hyperglycemia.;Provide education about proper  nutrition, including hydration, and aerobic/resistive exercise prescription along with prescribed medications to achieve blood glucose in normal ranges: Fasting glucose 65-99 mg/dL    Expected Outcomes Short Term: Participant verbalizes understanding of the signs/symptoms and immediate care of hyper/hypoglycemia, proper foot care and importance of medication, aerobic/resistive exercise and nutrition plan for blood glucose control.;Long Term: Attainment of HbA1C < 7%.    Heart Failure Yes    Intervention Provide a combined exercise and nutrition program that is supplemented with education, support and counseling about heart failure. Directed toward relieving symptoms such as shortness of breath, decreased exercise tolerance, and extremity edema.    Expected Outcomes Improve functional capacity of life;Short term: Attendance in program 2-3 days a week with increased exercise capacity. Reported lower sodium intake. Reported increased fruit and vegetable intake. Reports medication compliance.;Short term: Daily weights obtained and reported for increase. Utilizing diuretic protocols set by physician.;Long term: Adoption of self-care skills and reduction of barriers for early signs and symptoms recognition and intervention leading to self-care maintenance.    Hypertension Yes    Intervention Provide education on lifestyle modifcations including regular physical activity/exercise, weight management, moderate sodium restriction and increased consumption of fresh fruit, vegetables, and low fat dairy, alcohol moderation, and smoking cessation.;Monitor prescription use compliance.    Expected Outcomes Short Term: Continued assessment and intervention until BP is < 140/86mm HG in hypertensive participants. < 130/55mm HG in hypertensive participants with diabetes, heart failure or chronic kidney disease.;Long Term: Maintenance of blood pressure at goal levels.    Lipids Yes    Intervention Provide education and support  for participant on nutrition & aerobic/resistive exercise along with prescribed medications to achieve LDL 70mg , HDL >40mg .    Expected Outcomes Short Term: Participant states understanding of desired cholesterol values and is compliant with medications prescribed. Participant is following exercise prescription and nutrition guidelines.;Long Term: Cholesterol controlled with medications as prescribed, with individualized exercise RX and with personalized nutrition plan. Value goals: LDL < 70mg , HDL > 40 mg.           Tobacco Use Initial Evaluation: Social History   Tobacco Use  Smoking Status Former Smoker  . Packs/day: 0.50  . Types: Cigarettes  . Quit date: 10/07/2001  . Years since quitting: 18.8  Smokeless Tobacco Never Used    Exercise Goals and Review:  Exercise Goals    Row Name 08/05/20 0907             Exercise Goals   Increase Physical Activity Yes       Intervention Provide advice, education, support and counseling about physical activity/exercise needs.;Develop an individualized exercise prescription for aerobic and resistive training based on initial evaluation findings, risk stratification, comorbidities and participant's personal goals.       Expected Outcomes Short Term: Attend rehab on a regular basis to increase amount of physical activity.;Long Term: Add in home exercise to make exercise part of routine and to increase amount of physical activity.;Long Term:  Exercising regularly at least 3-5 days a week.       Increase Strength and Stamina Yes       Intervention Provide advice, education, support and counseling about physical activity/exercise needs.;Develop an individualized exercise prescription for aerobic and resistive training based on initial evaluation findings, risk stratification, comorbidities and participant's personal goals.       Expected Outcomes Short Term: Increase workloads from initial exercise prescription for resistance, speed, and METs.;Short  Term: Perform resistance training exercises routinely during rehab and add in resistance training at home;Long Term: Improve cardiorespiratory fitness, muscular endurance and strength as measured by increased METs and functional capacity (6MWT)       Able to understand and use rate of perceived exertion (RPE) scale Yes       Intervention Provide education and explanation on how to use RPE scale       Expected Outcomes Short Term: Able to use RPE daily in rehab to express subjective intensity level;Long Term:  Able to use RPE to guide intensity level when exercising independently       Able to understand and use Dyspnea scale Yes       Intervention Provide education and explanation on how to use Dyspnea scale       Expected Outcomes Short Term: Able to use Dyspnea scale daily in rehab to express subjective sense of shortness of breath during exertion;Long Term: Able to use Dyspnea scale to guide intensity level when exercising independently       Knowledge and understanding of Target Heart Rate Range (THRR) Yes       Intervention Provide education and explanation of THRR including how the numbers were predicted and where they are located for reference       Expected Outcomes Short Term: Able to use daily as guideline for intensity in rehab;Short Term: Able to state/look up THRR;Long Term: Able to use THRR to govern intensity when exercising independently       Able to check pulse independently Yes       Intervention Provide education and demonstration on how to check pulse in carotid and radial arteries.;Review the importance of being able to check your own pulse for safety during independent exercise       Expected Outcomes Short Term: Able to explain why pulse checking is important during independent exercise;Long Term: Able to check pulse independently and accurately       Understanding of Exercise Prescription Yes       Intervention Provide education, explanation, and written materials on patient's  individual exercise prescription       Expected Outcomes Short Term: Able to explain program exercise prescription;Long Term: Able to explain home exercise prescription to exercise independently              Copy of goals given to participant.

## 2020-08-05 NOTE — Progress Notes (Signed)
Cardiac Individual Treatment Plan  Patient Details  Name: Candace [name] MRN: [id number] Date of Birth: 05/13/1950 Referring Provider:   Flowsheet Row Cardiac Rehab from 08/05/2020 in [facility] Cardiac and Pulmonary Rehab  Referring Provider [name]      Initial Encounter Date:  Flowsheet Row Cardiac Rehab from 08/05/2020 in [facility] Cardiac and Pulmonary Rehab  Date 08/05/20      Visit Diagnosis: NSTEMI (non-ST elevated myocardial infarction) [facility])  Patient's Home Medications on Admission:  Current Outpatient Medications:  .  acetaminophen (TYLENOL) 325 MG tablet, Take 650 mg by mouth every 6 (six) hours as needed for moderate pain or headache. , Disp: , Rfl:  .  aspirin EC 81 MG EC tablet, Take 1 tablet (81 mg total) by mouth daily. Swallow whole., Disp: 30 tablet, Rfl: 0 .  atorvastatin (LIPITOR) 40 MG tablet, Take 1 tablet (40 mg total) by mouth daily., Disp: 30 tablet, Rfl: 0 .  benzonatate (TESSALON) 100 MG capsule, Take 1 capsule (100 mg total) by mouth 3 (three) times daily as needed for cough. (Patient not taking: No sig reported), Disp: 30 capsule, Rfl: 1 .  bismuth subsalicylate (PEPTO BISMOL) 262 MG chewable tablet, Chew 524 mg by mouth as needed., Disp: , Rfl:  .  colchicine 0.6 MG tablet, Take 1 tablet (0.6 mg total) by mouth 2 (two) times daily., Disp: 28 tablet, Rfl: 0 .  ELIQUIS 5 MG TABS tablet, Take 5 mg by mouth 2 (two) times daily., Disp: , Rfl:  .  isosorbide mononitrate (IMDUR) 60 MG 24 hr tablet, Take 1 tablet (60 mg total) by mouth daily., Disp: 30 tablet, Rfl: 0 .  JARDIANCE 25 MG TABS tablet, TAKE ONE TABLET BY MOUTH BEFORE BREAKFAST, Disp: 30 tablet, Rfl: 2 .  Lancets (ONETOUCH DELICA PLUS [id number]) MISC, USE UP TO 4 TIMES DAILY AS DIRECTED, Disp: 100 each, Rfl: 0 .  loperamide (IMODIUM) 2 MG capsule, Take 2 mg by mouth as needed., Disp: , Rfl:  .  metoprolol tartrate (LOPRESSOR) 100 MG tablet, Take 100 mg by mouth 2 (two) times daily., Disp: , Rfl:  .   nitroGLYCERIN (NITROSTAT) 0.4 MG SL tablet, Place 1 tablet (0.4 mg total) under the tongue every 5 (five) minutes as needed for chest pain., Disp: 30 tablet, Rfl: 0 .  nystatin (NYSTATIN) powder, Apply 1 application topically daily. (Patient taking differently: Apply 1 application topically daily as needed.), Disp: 60 g, Rfl: 5 .  ondansetron (ZOFRAN-ODT) 8 MG disintegrating tablet, Take 1 tablet (8 mg total) by mouth every 8 (eight) hours as needed., Disp: 20 tablet, Rfl: 1 .  pantoprazole (PROTONIX) 20 MG tablet, Take 1 tablet (20 mg total) by mouth 2 (two) times daily., Disp: 60 tablet, Rfl: 11 .  sertraline (ZOLOFT) 50 MG tablet, Take 1 tablet (50 mg total) by mouth daily., Disp: 90 tablet, Rfl: 1 .  sucralfate (CARAFATE) 1 g tablet, Take one tablet in the morning and one tablet in the evening. (Patient not taking: No sig reported), Disp: 120 tablet, Rfl: 12 .  TRULICITY 7.34 LP/3.7TK SOPN, INJECT 0.75 MG into THE SKIN ONCE A WEEK, Disp: 0.5 mL, Rfl: 4  Past Medical History: Past Medical History:  Diagnosis Date  . Allergy   . Anemia   . Anxiety   . Arrhythmia   . Arthritis   . Atrial fibrillation ([place])   . Coronary artery disease   . Depression   . Diabetes mellitus without complication ([place])   . Dyspnea  doe  . Dysrhythmia   . GERD (gastroesophageal reflux disease)   . Headache   . History of hiatal hernia   . Hyperlipidemia   . Hypertension   . Myocardial infarction ([place])    2016, 04/2017  . Myocardial infarction with cardiac rehabilitation [facility])    MI 2016/ CABG 8/17    FINISHED CARDIAC REHAB 3 WEEKS AGO  . Panic attack   . Pneumonia   . Reflux   . Stroke [facility]) 2015   showed up on MRI; no weakness noted  . TIA (transient ischemic attack)   . Voice tremor     Tobacco Use: Social History   Tobacco Use  Smoking Status Former Smoker  . Packs/day: 0.50  . Types: Cigarettes  . Quit date: 10/07/2001  . Years since quitting: 18.8  Smokeless Tobacco Never Used     Labs: Recent Chemical engineer    Labs for ITP Cardiac and Pulmonary Rehab Latest Ref Rng & Units 08/30/2019 08/31/2019 09/04/2019 12/06/2019 05/01/2020   Cholestrol 0 - 200 mg/dL - 234(H) - - -   LDLCALC 0 - 99 mg/dL - 163(H) - - -   HDL >40 mg/dL - 49 - - -   Trlycerides <150 mg/dL - 110 - - -   Hemoglobin A1c 4.0 - 5.6 % 9.8(H) - 9.4(A) 12.4(A) 12.2(A)   PHART 7.350 - 7.450 - - - - -   PCO2ART 35.0 - 45.0 mmHg - - - - -   HCO3 20.0 - 24.0 mEq/L - - - - -   TCO2 0 - 100 mmol/L - - - - -   O2SAT % - - - - -       Exercise Target Goals: Exercise Program Goal: Individual exercise prescription set using results from initial 6 min walk test and THRR while considering  patient's activity barriers and safety.   Exercise Prescription Goal: Initial exercise prescription builds to 30-45 minutes a day of aerobic activity, 2-3 days per week.  Home exercise guidelines will be given to patient during program as part of exercise prescription that the participant will acknowledge.   Education: Aerobic Exercise: - Group verbal and visual presentation on the components of exercise prescription. Introduces F.I.T.T principle from ACSM for exercise prescriptions.  Reviews F.I.T.T. principles of aerobic exercise including progression. Written material given at graduation. Flowsheet Row Cardiac Rehab from 08/05/2020 in [facility] Cardiac and Pulmonary Rehab  Education need identified 08/05/20      Education: Resistance Exercise: - Group verbal and visual presentation on the components of exercise prescription. Introduces F.I.T.T principle from ACSM for exercise prescriptions  Reviews F.I.T.T. principles of resistance exercise including progression. Written material given at graduation.    Education: Exercise & Equipment Safety: - Individual verbal instruction and demonstration of equipment use and safety with use of the equipment. Flowsheet Row Cardiac Rehab from 08/05/2020 in [facility] Cardiac and Pulmonary  Rehab  Date 08/05/20  Educator [facility]  Instruction Review Code 1- Verbalizes Understanding      Education: Exercise Physiology & General Exercise Guidelines: - Group verbal and written instruction with models to review the exercise physiology of the cardiovascular system and associated critical values. Provides general exercise guidelines with specific guidelines to those with heart or lung disease.  Flowsheet Row Cardiac Rehab from 08/04/2017 in [facility] Cardiac and Pulmonary Rehab  Date 07/07/17  Educator [facility]  Instruction Review Code 1- Verbalizes Understanding      Education: Flexibility, Balance, Mind/Body Relaxation: - Group verbal and visual presentation with  interactive activity on the components of exercise prescription. Introduces F.I.T.T principle from ACSM for exercise prescriptions. Reviews F.I.T.T. principles of flexibility and balance exercise training including progression. Also discusses the mind body connection.  Reviews various relaxation techniques to help reduce and manage stress (i.e. Deep breathing, progressive muscle relaxation, and visualization). Balance handout provided to take home. Written material given at graduation. Flowsheet Row Cardiac Rehab from 02/08/2018 in [facility] Cardiac and Pulmonary Rehab  Date 01/03/18  Educator AS  Instruction Review Code 1- Verbalizes Understanding      Activity Barriers & Risk Stratification:  Activity Barriers & Cardiac Risk Stratification - 08/05/20 0905      Activity Barriers & Cardiac Risk Stratification   Activity Barriers Back Problems;Deconditioning;Shortness of Breath;Muscular Weakness;Balance Concerns;History of Falls    Cardiac Risk Stratification High           6 Minute Walk:  6 Minute Walk    Row Name 08/05/20 0903         6 Minute Walk   Phase Initial     Distance 930 feet     Walk Time 5.53 minutes     # of Rest Breaks 1  28 sec     MPH 1.91     METS 2.58     RPE 14     Perceived Dyspnea  3     VO2 Peak  9.05     Symptoms Yes (comment)     Comments chest tightness 7-10, legs aching (thighs and calves) 8/10, SOB, wobbly gait     Resting HR 85 bpm     Resting BP 122/64     Resting Oxygen Saturation  96 %     Exercise Oxygen Saturation  during 6 min walk 98 %     Max Ex. HR 128 bpm     Max Ex. BP 156/74     2 Minute Post BP 126/70            Oxygen Initial Assessment:   Oxygen Re-Evaluation:   Oxygen Discharge (Final Oxygen Re-Evaluation):   Initial Exercise Prescription:  Initial Exercise Prescription - 08/05/20 0900      Date of Initial Exercise RX and Referring Provider   Date 08/05/20    Referring Provider [name]      NuStep   Level 2    SPM 80    Minutes 15    METs 2.5      Arm Ergometer   Level 1    Watts 25    RPM 25    Minutes 15    METs 2      Track   Laps 25    Minutes 15    METs 2.4      Prescription Details   Frequency (times per week) 3    Duration Progress to 30 minutes of continuous aerobic without signs/symptoms of physical distress      Intensity   THRR 40-80% of Max Heartrate 111-138    Ratings of Perceived Exertion 11-13    Perceived Dyspnea 0-4      Progression   Progression Continue to progress workloads to maintain intensity without signs/symptoms of physical distress.      Resistance Training   Training Prescription Yes    Weight 3 lb    Reps 10-15           Perform Capillary Blood Glucose checks as needed.  Exercise Prescription Changes:  Exercise Prescription Changes    Row Name 08/05/20 0900  Response to Exercise   Blood Pressure (Admit) 122/64       Blood Pressure (Exercise) 156/74       Blood Pressure (Exit) 126/70       Heart Rate (Admit) 85 bpm       Heart Rate (Exercise) 128 bpm       Heart Rate (Exit) 76 bpm       Oxygen Saturation (Admit) 96 %       Oxygen Saturation (Exercise) 98 %       Rating of Perceived Exertion (Exercise) 14       Perceived Dyspnea (Exercise) 3        Symptoms wobbly gait, SOB, legs aching (8/10), chest tightness (7/10)       Comments walk test results              Exercise Comments:   Exercise Goals and Review:  Exercise Goals    Row Name 08/05/20 0907             Exercise Goals   Increase Physical Activity Yes       Intervention Provide advice, education, support and counseling about physical activity/exercise needs.;Develop an individualized exercise prescription for aerobic and resistive training based on initial evaluation findings, risk stratification, comorbidities and participant's personal goals.       Expected Outcomes Short Term: Attend rehab on a regular basis to increase amount of physical activity.;Long Term: Add in home exercise to make exercise part of routine and to increase amount of physical activity.;Long Term: Exercising regularly at least 3-5 days a week.       Increase Strength and Stamina Yes       Intervention Provide advice, education, support and counseling about physical activity/exercise needs.;Develop an individualized exercise prescription for aerobic and resistive training based on initial evaluation findings, risk stratification, comorbidities and participant's personal goals.       Expected Outcomes Short Term: Increase workloads from initial exercise prescription for resistance, speed, and METs.;Short Term: Perform resistance training exercises routinely during rehab and add in resistance training at home;Long Term: Improve cardiorespiratory fitness, muscular endurance and strength as measured by increased METs and functional capacity (6MWT)       Able to understand and use rate of perceived exertion (RPE) scale Yes       Intervention Provide education and explanation on how to use RPE scale       Expected Outcomes Short Term: Able to use RPE daily in rehab to express subjective intensity level;Long Term:  Able to use RPE to guide intensity level when exercising independently       Able to understand  and use Dyspnea scale Yes       Intervention Provide education and explanation on how to use Dyspnea scale       Expected Outcomes Short Term: Able to use Dyspnea scale daily in rehab to express subjective sense of shortness of breath during exertion;Long Term: Able to use Dyspnea scale to guide intensity level when exercising independently       Knowledge and understanding of Target Heart Rate Range (THRR) Yes       Intervention Provide education and explanation of THRR including how the numbers were predicted and where they are located for reference       Expected Outcomes Short Term: Able to use daily as guideline for intensity in rehab;Short Term: Able to state/look up THRR;Long Term: Able to use THRR to govern intensity when exercising independently  Able to check pulse independently Yes       Intervention Provide education and demonstration on how to check pulse in carotid and radial arteries.;Review the importance of being able to check your own pulse for safety during independent exercise       Expected Outcomes Short Term: Able to explain why pulse checking is important during independent exercise;Long Term: Able to check pulse independently and accurately       Understanding of Exercise Prescription Yes       Intervention Provide education, explanation, and written materials on patient's individual exercise prescription       Expected Outcomes Short Term: Able to explain program exercise prescription;Long Term: Able to explain home exercise prescription to exercise independently              Exercise Goals Re-Evaluation :   Discharge Exercise Prescription (Final Exercise Prescription Changes):  Exercise Prescription Changes - 08/05/20 0900      Response to Exercise   Blood Pressure (Admit) 122/64    Blood Pressure (Exercise) 156/74    Blood Pressure (Exit) 126/70    Heart Rate (Admit) 85 bpm    Heart Rate (Exercise) 128 bpm    Heart Rate (Exit) 76 bpm    Oxygen Saturation  (Admit) 96 %    Oxygen Saturation (Exercise) 98 %    Rating of Perceived Exertion (Exercise) 14    Perceived Dyspnea (Exercise) 3    Symptoms wobbly gait, SOB, legs aching (8/10), chest tightness (7/10)    Comments walk test results           Nutrition:  Target Goals: Understanding of nutrition guidelines, daily intake of sodium 1500mg , cholesterol 200mg , calories 30% from fat and 7% or less from saturated fats, daily to have 5 or more servings of fruits and vegetables.  Education: All About Nutrition: -Group instruction provided by verbal, written material, interactive activities, discussions, models, and posters to present general guidelines for heart healthy nutrition including fat, fiber, MyPlate, the role of sodium in heart healthy nutrition, utilization of the nutrition label, and utilization of this knowledge for meal planning. Follow up email sent as well. Written material given at graduation. Flowsheet Row Cardiac Rehab from 08/05/2020 in [facility] Cardiac and Pulmonary Rehab  Education need identified 08/05/20      Biometrics:  Pre Biometrics - 08/05/20 0908      Pre Biometrics   Height 5' 2.1" (1.577 m)    Weight 166 lb (75.3 kg)    BMI (Calculated) 30.28    Single Leg Stand 0.8 seconds            Nutrition Therapy Plan and Nutrition Goals:  Nutrition Therapy & Goals - 08/05/20 0909      Intervention Plan   Intervention Prescribe, educate and counsel regarding individualized specific dietary modifications aiming towards targeted core components such as weight, hypertension, lipid management, diabetes, heart failure and other comorbidities.    Expected Outcomes Short Term Goal: Understand basic principles of dietary content, such as calories, fat, sodium, cholesterol and nutrients.;Short Term Goal: A plan has been developed with personal nutrition goals set during dietitian appointment.;Long Term Goal: Adherence to prescribed nutrition plan.           Nutrition  Assessments:  MEDIFICTS Score Key:  ?70 Need to make dietary changes   40-70 Heart Healthy Diet  ? 40 Therapeutic Level Cholesterol Diet  Flowsheet Row Cardiac Rehab from 08/05/2020 in [facility] Cardiac and Pulmonary Rehab  Picture Your Plate Total Score on  Admission 65     Picture Your Plate Scores:  <46 Unhealthy dietary pattern with much room for improvement.  41-50 Dietary pattern unlikely to meet recommendations for good health and room for improvement.  51-60 More healthful dietary pattern, with some room for improvement.   >60 Healthy dietary pattern, although there may be some specific behaviors that could be improved.    Nutrition Goals Re-Evaluation:   Nutrition Goals Discharge (Final Nutrition Goals Re-Evaluation):   Psychosocial: Target Goals: Acknowledge presence or absence of significant depression and/or stress, maximize coping skills, provide positive support system. Participant is able to verbalize types and ability to use techniques and skills needed for reducing stress and depression.   Education: Stress, Anxiety, and Depression - Group verbal and visual presentation to define topics covered.  Reviews how body is impacted by stress, anxiety, and depression.  Also discusses healthy ways to reduce stress and to treat/manage anxiety and depression.  Written material given at graduation. Flowsheet Row Cardiac Rehab from 02/08/2018 in [facility] Cardiac and Pulmonary Rehab  Date 12/28/17  Educator [facility]  Instruction Review Code 5- Refused Teaching      Education: Sleep Hygiene -Provides group verbal and written instruction about how sleep can affect your health.  Define sleep hygiene, discuss sleep cycles and impact of sleep habits. Review good sleep hygiene tips.  Flowsheet Row Cardiac Rehab from 02/08/2018 in [facility] Cardiac and Pulmonary Rehab  Date 02/08/18  Educator [facility]  Instruction Review Code 1- Verbalizes Understanding      Initial Review & Psychosocial  Screening:  Initial Psych Review & Screening - 08/01/20 1520      Initial Review   Current issues with History of Depression;Current Sleep Concerns;Current Stress Concerns    Source of Stress Concerns Chronic Illness;Unable to perform yard/household activities;Unable to participate in former interests or hobbies      [place]? Yes      Barriers   Psychosocial barriers to participate in program The patient should benefit from training in stress management and relaxation.;Psychosocial barriers identified (see note)      Screening Interventions   Interventions Encouraged to exercise;Provide feedback about the scores to participant;To provide support and resources with identified psychosocial needs    Expected Outcomes Short Term goal: Utilizing psychosocial counselor, staff and physician to assist with identification of specific Stressors or current issues interfering with healing process. Setting desired goal for each stressor or current issue identified.;Long Term Goal: Stressors or current issues are controlled or eliminated.;Long Term goal: The participant improves quality of Life and PHQ9 Scores as seen by post scores and/or verbalization of changes;Short Term goal: Identification and review with participant of any Quality of Life or Depression concerns found by scoring the questionnaire.           Quality of Life Scores:   Quality of Life - 08/05/20 0908      Quality of Life   Select Quality of Life      Quality of Life Scores   Health/Function Pre 17.32 %    Socioeconomic Pre 18.21 %    Psych/Spiritual Pre 23.79 %    Family Pre 29.35 %    GLOBAL Pre 20.44 %          Scores of 19 and below usually indicate a poorer quality of life in these areas.  A difference of  2-3 points is a clinically meaningful difference.  A difference of 2-3 points in the total score of the Quality of  Life Index has been associated with significant improvement in overall  quality of life, self-image, physical symptoms, and general health in studies assessing change in quality of life.  PHQ-9: Recent Review Flowsheet Data    Depression screen [facility] 2/9 08/05/2020 04/15/2020 04/02/2020 02/06/2020 07/12/2019   Decreased Interest 1 0 0 1 1   Down, Depressed, Hopeless 1 0 0 0 0   PHQ - 2 Score 2 0 0 1 1   Altered sleeping 1 - - 1 1   Tired, decreased energy 1 - - 1 1   Change in appetite 1 - - 1 0   Feeling bad or failure about yourself  0 - - 0 0   Trouble concentrating 0 - - 0 0   Moving slowly or fidgety/restless 0 - - 0 0   Suicidal thoughts 0 - - 0 0   PHQ-9 Score 5 - - 4 3   Difficult doing work/chores Not difficult at all - - Not difficult at all Not difficult at all     Interpretation of Total Score  Total Score Depression Severity:  1-4 = Minimal depression, 5-9 = Mild depression, 10-14 = Moderate depression, 15-19 = Moderately severe depression, 20-27 = Severe depression   Psychosocial Evaluation and Intervention:  Psychosocial Evaluation - 08/01/20 1520      Psychosocial Evaluation & Interventions   Interventions Stress management education;Relaxation education;Encouraged to exercise with the program and follow exercise prescription    Comments Jeanise is returning to the program after completing it in 2019. She has had a NSTEMI this time. She states that this last year has been filled with medical issues and she is finally starting to feel better. She just started back driving and is looking forward to picking up her grandkids from school. Her family is her support system and she cherishes them. She doesn't report any symptoms of depression today and states she is only focused on seeing the positive side of things. She is excited to come back to the program and see some familiar faces. She is really wanting to maintain her independence and she knows this is the best way to do that    Expected Outcomes Short: attend cardiac rehab for education and exercise.  Long: Develop positive self care habits.    Continue Psychosocial Services  Follow up required by staff           Psychosocial Re-Evaluation:   Psychosocial Discharge (Final Psychosocial Re-Evaluation):   Vocational Rehabilitation: Provide vocational rehab assistance to qualifying candidates.   Vocational Rehab Evaluation & Intervention:  Vocational Rehab - 08/01/20 1520      Initial Vocational Rehab Evaluation & Intervention   Assessment shows need for Vocational Rehabilitation No           Education: Education Goals: Education classes will be provided on a variety of topics geared toward better understanding of heart health and risk factor modification. Participant will state understanding/return demonstration of topics presented as noted by education test scores.  Learning Barriers/Preferences:  Learning Barriers/Preferences - 08/01/20 1519      Learning Barriers/Preferences   Learning Barriers None    Learning Preferences None           General Cardiac Education Topics:  AED/CPR: - Group verbal and written instruction with the use of models to demonstrate the basic use of the AED with the basic ABC's of resuscitation. Flowsheet Row Cardiac Rehab from 02/08/2018 in [facility] Cardiac and Pulmonary Rehab  Date 01/26/18  Educator MA  Instruction Review Code 1- Verbalizes Understanding      Anatomy and Cardiac Procedures: - Group verbal and visual presentation and models provide information about basic cardiac anatomy and function. Reviews the testing methods done to diagnose heart disease and the outcomes of the test results. Describes the treatment choices: Medical Management, Angioplasty, or Coronary Bypass Surgery for treating various heart conditions including Myocardial Infarction, Angina, Valve Disease, and Cardiac Arrhythmias.  Written material given at graduation. Flowsheet Row Cardiac Rehab from 08/05/2020 in [facility] Cardiac and Pulmonary Rehab  Education need  identified 08/05/20      Medication Safety: - Group verbal and visual instruction to review commonly prescribed medications for heart and lung disease. Reviews the medication, class of the drug, and side effects. Includes the steps to properly store meds and maintain the prescription regimen.  Written material given at graduation. Flowsheet Row Cardiac Rehab from 02/08/2018 in [facility] Cardiac and Pulmonary Rehab  Date 01/17/18  Educator SB  Instruction Review Code 1- Verbalizes Understanding      Intimacy: - Group verbal instruction through game format to discuss how heart and lung disease can affect sexual intimacy. Written material given at graduation.. Flowsheet Row Cardiac Rehab from 02/08/2018 in [facility] Re[facility]  Date 12/20/17  Educator SB  Instruction Review Code 1- Verbalizes Understanding      Know Your Numbers and Heart Failure: - Group verbal and visual instruction to discuss disease risk factors for cardiac and pulmonary disease and treatment options.  Reviews associated critical values for Overweight/Obesity, Hypertension, Cholesterol, and Diabetes.  Discusses basics of heart failure: signs/symptoms and treatments.  Introduces Heart Failure Zone chart for action plan for heart failure.  Written material given at graduation. Flowsheet Row Cardiac Rehab from 02/08/2018 in [facility] Cardiac and Pulmonary Rehab  Date 01/12/18  Educator CE  Instruction Review Code 1- Verbalizes Understanding      Infection Prevention: - Provides verbal and written material to individual with discussion of infection control including proper hand washing and proper equipment cleaning during exercise session. Flowsheet Row Cardiac Rehab from 08/05/2020 in [facility] Card[facility] Pulmonary Rehab  Date 08/05/20  Educator [facility]  Instruction Review Code 1- Verbalizes Understanding      Falls Prevention: - Provides verbal and written material to individual with discussion of falls prevention and  safety. Flowsheet Row Cardiac Rehab from 08/05/2020 in [facility] Cardiac and Pulmonary Rehab  Date 08/05/20  Educator [facility]  Instruction Review Code 1- Verbalizes Understanding      Other: -Provides group and verbal instruction on various topics (see comments)   Knowledge Questionnaire Score:  Knowledge Questionnaire Score - 08/05/20 0910      Knowledge Questionnaire Score   Pre Score 22/26 Education Focus: Angina, Nutrition, Exercise           Core Components/Risk Factors/Patient Goals at Admission:  Personal Goals and Risk Factors at Admission - 08/05/20 0910      Core Components/Risk Factors/Patient Goals on Admission    Weight Management Yes;Obesity;Weight Loss    Intervention Weight Management: Develop a combined nutrition and exercise program designed to reach desired caloric intake, while maintaining appropriate intake of nutrient and fiber, sodium and fats, and appropriate energy expenditure required for the weight goal.;Weight Management: Provide education and appropriate resources to help participant work on and attain dietary goals.;Weight Management/Obesity: Establish reasonable short term and long term weight goals.;Obesity: Provide education and appropriate resources to help participant work on and attain dietary goals.    Admit Weight 166 lb (75.3  kg)    Goal Weight: Short Term 160 lb (72.6 kg)    Goal Weight: Long Term 155 lb (70.3 kg)    Expected Outcomes Short Term: Continue to assess and modify interventions until short term weight is achieved;Long Term: Adherence to nutrition and physical activity/exercise program aimed toward attainment of established weight goal;Weight Loss: Understanding of general recommendations for a balanced deficit meal plan, which promotes 1-2 lb weight loss per week and includes a negative energy balance of [phone] kcal/d;Understanding recommendations for meals to include 15-35% energy as protein, 25-35% energy from fat, 35-60% energy from  carbohydrates, less than 200mg  of dietary cholesterol, 20-35 gm of total fiber daily;Understanding of distribution of calorie intake throughout the day with the consumption of 4-5 meals/snacks    Diabetes Yes    Intervention Provide education about signs/symptoms and action to take for hypo/hyperglycemia.;Provide education about proper nutrition, including hydration, and aerobic/resistive exercise prescription along with prescribed medications to achieve blood glucose in normal ranges: Fasting glucose 65-99 mg/dL    Expected Outcomes Short Term: Participant verbalizes understanding of the signs/symptoms and immediate care of hyper/hypoglycemia, proper foot care and importance of medication, aerobic/resistive exercise and nutrition plan for blood glucose control.;Long Term: Attainment of HbA1C < 7%.    Heart Failure Yes    Intervention Provide a combined exercise and nutrition program that is supplemented with education, support and counseling about heart failure. Directed toward relieving symptoms such as shortness of breath, decreased exercise tolerance, and extremity edema.    Expected Outcomes Improve functional capacity of life;Short term: Attendance in program 2-3 days a week with increased exercise capacity. Reported lower sodium intake. Reported increased fruit and vegetable intake. Reports medication compliance.;Short term: Daily weights obtained and reported for increase. Utilizing diuretic protocols set by physician.;Long term: Adoption of self-care skills and reduction of barriers for early signs and symptoms recognition and intervention leading to self-care maintenance.    Hypertension Yes    Intervention Provide education on lifestyle modifcations including regular physical activity/exercise, weight management, moderate sodium restriction and increased consumption of fresh fruit, vegetables, and low fat dairy, alcohol moderation, and smoking cessation.;Monitor prescription use compliance.     Expected Outcomes Short Term: Continued assessment and intervention until BP is < 140/61mm HG in hypertensive participants. < 130/35mm HG in hypertensive participants with diabetes, heart failure or chronic kidney disease.;Long Term: Maintenance of blood pressure at goal levels.    Lipids Yes    Intervention Provide education and support for participant on nutrition & aerobic/resistive exercise along with prescribed medications to achieve LDL 70mg , HDL >40mg .    Expected Outcomes Short Term: Participant states understanding of desired cholesterol values and is compliant with medications prescribed. Participant is following exercise prescription and nutrition guidelines.;Long Term: Cholesterol controlled with medications as prescribed, with individualized exercise RX and with personalized nutrition plan. Value goals: LDL < 70mg , HDL > 40 mg.           Education:Diabetes - Individual verbal and written instruction to review signs/symptoms of diabetes, desired ranges of glucose level fasting, after meals and with exercise. Acknowledge that pre and post exercise glucose checks will be done for 3 sessions at entry of program. [place] from 08/05/2020 in [facility] Cardiac and Pulmonary Rehab  Date 08/01/20  Educator [facility]  Instruction Review Code 1- Verbalizes Understanding      Core Components/Risk Factors/Patient Goals Review:    Core Components/Risk Factors/Patient Goals at Discharge (Final Review):    ITP Comments:  ITP Comments  [place] Name 08/01/20 1507 08/05/20 0903         ITP Comments Initial telephone orientation completed. Diagnosis can be found in [facility] 4/13. EP orientation scheduled for Tuesday 5/10 at 8am. Completed 6MWT and gym orientation. Initial ITP created and sent for review to Dr. Emily Filbert, Medical Director.             Comments: Initial ITP

## 2020-08-07 ENCOUNTER — Telehealth: Payer: Self-pay

## 2020-08-07 NOTE — Progress Notes (Signed)
Left voice message to confirmed patient telephone appointment on 08/08/2020 for CCM at 9:00 am with Junius Argyle the Clinical pharmacist.   [place] Pharmacist Assistant [phone]

## 2020-08-08 ENCOUNTER — Ambulatory Visit (INDEPENDENT_AMBULATORY_CARE_PROVIDER_SITE_OTHER): Payer: Medicare Other

## 2020-08-08 DIAGNOSIS — E785 Hyperlipidemia, unspecified: Secondary | ICD-10-CM

## 2020-08-08 DIAGNOSIS — E118 Type 2 diabetes mellitus with unspecified complications: Secondary | ICD-10-CM

## 2020-08-08 DIAGNOSIS — E1159 Type 2 diabetes mellitus with other circulatory complications: Secondary | ICD-10-CM

## 2020-08-08 DIAGNOSIS — E1165 Type 2 diabetes mellitus with hyperglycemia: Secondary | ICD-10-CM

## 2020-08-08 DIAGNOSIS — I152 Hypertension secondary to endocrine disorders: Secondary | ICD-10-CM

## 2020-08-08 DIAGNOSIS — I480 Paroxysmal atrial fibrillation: Secondary | ICD-10-CM | POA: Diagnosis not present

## 2020-08-08 DIAGNOSIS — I5032 Chronic diastolic (congestive) heart failure: Secondary | ICD-10-CM

## 2020-08-08 DIAGNOSIS — E1169 Type 2 diabetes mellitus with other specified complication: Secondary | ICD-10-CM

## 2020-08-08 MED ORDER — TRULICITY 1.5 MG/0.5ML ~~LOC~~ SOAJ: 1.5000 mg | SUBCUTANEOUS | 2 refills | Status: DC

## 2020-08-08 NOTE — Progress Notes (Signed)
Chronic Care Management Pharmacy Note  08/08/2020 Name:  Candace [name] MRN:  [id number] DOB:  September 12, 1950  Subjective: Candace [name] is an 70 y.o. year old female who is a primary patient of [name]., MD.  The CCM team was consulted for assistance with disease management and care coordination needs.    Engaged with patient by telephone for follow up visit in response to provider referral for pharmacy case management and/or care coordination services.   Consent to Services:  The patient was given information about Chronic Care Management services, agreed to services, and gave verbal consent prior to initiation of services.  Please see initial visit note for detailed documentation.   Patient Care Team: [name]., MD as PCP - General (Family Medicine) Corey Skains, MD as Consulting Physician (Cardiology) Sharlotte Alamo, DPM (Podiatry) Neldon Labella, RN as Case Manager Virgel Manifold, MD as Consulting Physician (Gastroenterology) [place], [place] Michaelle Birks, Glean Salvo, [facility] as Pharmacist (Pharmacist)  Recent office visits: 05/13/20: Patient presented to Dr. Rosanna Randy for viral URI. Patient started on benzonatate.  05/01/20: Patient presented to Dr. Rosanna Randy for follow-up. A1c 12.2%. Patient started on Trulicity 9.56 mg weekly. Carafate  04/02/20: Patient presented to [facility], LPN for AWV.   Recent consult visits: 08/07/20: Patient presented to Dr. Nehemiah Massed (Cardiology) for follow-up. No medication changes noted.  07/07/20: Patient presented to Dr. Nehemiah Massed (Cardiology) for follow-up. No medication changes noted. 02/27/21: Patient presented to Hassell Done, NP. Patient started on Eliquis 5 mg twice daily for A-Fib. Non-adherence to atorvastatin   Hospital visits: 07/09/20: Patient hospitalized for chest pain. Aspirin stopped. Clopidogrel 7.74m daily, Ezetimibe 10 mg daily started. 3/22-3/23: Patient hospitalized for chest pain. Demand  ischemia, possible pericarditis. Patient started on aspirin 81 mg daily, colchicine 0.6 mg twice daily   Objective:  Lab Results  Component Value Date   CREATININE 0.64 06/17/2020   BUN 11 06/17/2020   GFRNONAA >60 06/17/2020   GFRAA 108 02/06/2020   NA 140 06/17/2020   K 4.3 06/17/2020   CALCIUM 9.3 06/17/2020   CO2 24 06/17/2020    Lab Results  Component Value Date/Time   HGBA1C 12.2 (A) 05/01/2020 09:55 AM   HGBA1C 12.4 (A) 12/06/2019 11:35 AM   HGBA1C 9.8 (H) 08/30/2019 07:15 AM   HGBA1C 9.2 07/10/2019 12:00 AM   HGBA1C 9.5 (H) 03/06/2019 07:25 PM   HGBA1C 8.3 (H) 07/14/2013 05:42 AM    Last diabetic Eye exam:  Lab Results  Component Value Date/Time   HMDIABEYEEXA No Retinopathy 06/28/2019 12:00 AM    Last diabetic Foot exam: No results found for: HMDIABFOOTEX   Lab Results  Component Value Date   CHOL 234 (H) 08/31/2019   HDL 49 08/31/2019   LDLCALC 163 (H) 08/31/2019   TRIG 110 08/31/2019   CHOLHDL 4.8 08/31/2019    Hepatic Function Latest Ref Rng & Units 02/06/2020 11/21/2019 08/29/2019  Total Protein 6.0 - 8.5 g/dL 6.9 - 7.0  Albumin 3.8 - 4.8 g/dL 4.4 - 4.2  AST 0 - 40 IU/L 18 - 21  ALT 0 - 32 IU/L 13 - 15  Alk Phosphatase 44 - 121 IU/L 135(H) 139(H) 98  Total Bilirubin 0.0 - 1.2 mg/dL 0.2 - 0.6  Bilirubin, Direct 0.0 - 0.2 mg/dL - - <0.1    Lab Results  Component Value Date/Time   TSH 1.180 01/03/2019 09:57 AM   TSH 1.262 09/14/2018 05:04 PM   TSH 1.661 12/23/2016 07:03 PM  TSH 0.818 10/01/2016 09:35 AM    CBC Latest Ref Rng & Units 06/17/2020 08/31/2019 08/29/2019  WBC 4.0 - 10.5 K/uL 6.9 6.8 7.3  Hemoglobin 12.0 - 15.0 g/dL 13.6 12.4 13.9  Hematocrit 36.0 - 46.0 % 42.0 37.7 40.5  Platelets 150 - 400 K/uL 211 190 229    Lab Results  Component Value Date/Time   VD25OH 12.2 (L) 10/01/2016 09:35 AM    Clinical ASCVD: Yes  The ASCVD Risk score Mikey Bussing DC Jr., et al., 2013) failed to calculate for the following reasons:   The patient has a prior  MI or stroke diagnosis    Depression screen [facility] 2/9 08/05/2020 04/15/2020 04/02/2020  Decreased Interest 1 0 0  Down, Depressed, Hopeless 1 0 0  PHQ - 2 Score 2 0 0  Altered sleeping 1 - -  Tired, decreased energy 1 - -  Change in appetite 1 - -  Feeling bad or failure about yourself  0 - -  Trouble concentrating 0 - -  Moving slowly or fidgety/restless 0 - -  Suicidal thoughts 0 - -  PHQ-9 Score 5 - -  Difficult doing work/chores Not difficult at all - -  Some recent data might be hidden    Social History   Tobacco Use  Smoking Status Former Smoker  . Packs/day: 0.50  . Types: Cigarettes  . Quit date: 10/07/2001  . Years since quitting: 18.8  Smokeless Tobacco Never Used   BP Readings from Last 3 Encounters:  06/18/20 (!) 101/49  05/13/20 (!) 166/95  05/01/20 121/71   Pulse Readings from Last 3 Encounters:  06/18/20 61  05/13/20 86  05/01/20 90   Wt Readings from Last 3 Encounters:  08/05/20 166 lb (75.3 kg)  06/18/20 164 lb 3.2 oz (74.5 kg)  05/13/20 169 lb (76.7 kg)    Assessment/Interventions: Review of patient past medical history, allergies, medications, health status, including review of consultants reports, laboratory and other test data, was performed as part of comprehensive evaluation and provision of chronic care management services.   SDOH:  (Social Determinants of Health) assessments and interventions performed: Yes SDOH Interventions   Flowsheet Row Most Recent Value  SDOH Interventions   Financial Strain Interventions Intervention Not Indicated      CCM Care Plan  Allergies  Allergen Reactions  . Lisinopril Cough  . Penicillins Swelling, Rash and Other (See Comments)    Did it involve swelling of the face/tongue/throat, SOB, or low BP? Yes Did it involve sudden or severe rash/hives, skin peeling, or any reaction on the inside of your mouth or nose? Yes Did you need to seek medical attention at a hospital or doctor's office? Yes When did it last  happen?15 years If all above answers are "NO", may proceed with cephalosporin use.     Medications Reviewed Today    Reviewed by Germaine Pomfret, [facility] (Pharmacist) on 08/08/20 at [phone]  Med List Status: <None>  Medication Order Taking? Sig Documenting Provider Last Dose Status Informant  acetaminophen (TYLENOL) 325 MG tablet [id number]  Take 650 mg by mouth every 6 (six) hours as needed for moderate pain or headache.  [provider]  Active Self  bismuth subsalicylate (PEPTO BISMOL) 262 MG chewable tablet [id number]  Chew 524 mg by mouth as needed. [provider]  Active Self        Discontinued 03/06/19 1830   clopidogrel (PLAVIX) 75 MG tablet [id number] Yes Take 75 mg by mouth daily. [provider]  Active  ELIQUIS 5 MG TABS tablet [id number]  Take 5 mg by mouth 2 (two) times daily. [provider]  Active Self           Med Note Michaelle Birks, Glean Salvo   Fri Jun 06, 2020 11:56 AM) Prescribed by Dr. Nehemiah Massed  ezetimibe (ZETIA) 10 MG tablet [id number] Yes Take 10 mg by mouth daily. [provider]  Active   isosorbide mononitrate (IMDUR) 30 MG 24 hr tablet [id number]  Take 30 mg by mouth daily. [provider]  Active   JARDIANCE 25 MG TABS tablet [id number]  TAKE ONE TABLET BY MOUTH BEFORE BREAKFAST [name]., MD  Active   Lancets (ONETOUCH DELICA PLUS [id number]) [place] [id number]  USE UP TO 4 TIMES DAILY AS DIRECTED [name]., MD  Active Self  loperamide (IMODIUM) 2 MG capsule [id number]  Take 2 mg by mouth as needed. [provider]  Active Self  metoprolol tartrate (LOPRESSOR) 100 MG tablet [id number]  Take 100 mg by mouth 2 (two) times daily. [provider]  Active Self           Med Note Michaelle Birks, Cathe Mons A   Fri Jun 06, 2020 11:56 AM) Prescribed by Dr. Nehemiah Massed   nitroGLYCERIN (NITROSTAT) 0.4 MG SL tablet [id number]  Place 1 tablet (0.4 mg total) under the tongue every 5 (five) minutes as  needed for chest pain. Bettey Costa, MD  Active Self           Med Note Johny Drilling Mar 27, 2018  2:39 PM)    nystatin (NYSTATIN) powder [id number]  Apply 1 application topically daily.  Patient taking differently: Apply 1 application topically daily as needed.   Rubie Maid, MD  Active Self  ondansetron (ZOFRAN-ODT) 8 MG disintegrating tablet [id number]  Take 1 tablet (8 mg total) by mouth every 8 (eight) hours as needed. [name]., MD  Active   pantoprazole (PROTONIX) 20 MG tablet [id number]  Take 1 tablet (20 mg total) by mouth 2 (two) times daily. [name]., MD  Active Self  rosuvastatin (CRESTOR) 40 MG tablet [id number]  Take 40 mg by mouth daily. [provider]  Active   sertraline (ZOLOFT) 50 MG tablet [id number]  Take 1 tablet (50 mg total) by mouth daily. [name]., MD  Active   sucralfate (CARAFATE) 1 g tablet [id number]  Take one tablet in the morning and one tablet in the evening.  Patient not taking: No sig reported   [name]., MD  Active Self  TRULICITY 4.09 BD/5.3GD [employer] [employer] [id number]  INJECT 0.75 MG into THE SKIN ONCE A WEEK [name]., MD  Active           Patient Active Problem List   Diagnosis Date Noted  . Vertigo   . Unsteady gait   . Acute bronchitis 03/07/2019  . Esophageal dysphagia   . Stomach irritation   . Gastric polyp   . Esophageal lump   . Hx of colonic polyp   . Polyp of colon   . Diverticulosis of large intestine without diverticulitis   . PSVT (paroxysmal supraventricular tachycardia) ([place]) 10/03/2018  . Abnormal ECG 07/05/2018  . Tachycardia 03/27/2018  . Pain in limb 02/28/2018  . Chronic diastolic CHF (congestive heart failure) ([place]) 11/03/2017  . TIA (transient ischemic attack) 11/03/2017  . COPD suggested by initial evaluation ([place]) 08/23/2017  . Acute and chronic respiratory failure with hypoxia (HCC)  08/23/2017  . Postoperative state 08/15/2017  . Incidental  pulmonary nodule 08/01/2017  . Non-ST elevation myocardial infarction (NSTEMI), subendocardial infarction, subsequent episode of care ([place]) 06/01/2017  . B12 deficiency 12/01/2016  . Vitamin D deficiency 11/04/2016  . Depression with anxiety 09/30/2016  . AF (paroxysmal atrial fibrillation) ([place]) 09/30/2016  . Resting tremor 09/30/2016  . Mild obstructive sleep apnea 09/30/2016  . Headache 08/18/2016  . Unstable angina ([place]) 08/03/2016  . Type 2 diabetes mellitus with complication, without long-term current use of insulin ([place]) 08/03/2016  . Chronic daily headache 05/05/2016  . Asthma 11/11/2015  . S/P CABG x 4 11/10/2015  . Coronary artery disease 11/07/2015  . Carotid artery narrowing 02/08/2014  . Chest pain 02/08/2014  . Mixed hyperlipidemia 02/08/2014  . Bilateral carotid artery stenosis 02/08/2014  . Atypical chest pain 02/07/2014  . Essential hypertension 02/07/2014  . Awareness of heartbeats 02/07/2014  . D (diarrhea) 10/05/2013  . Gastroesophageal reflux disease 10/05/2013    Immunization History  Administered Date(s) Administered  . Fluad Quad(high Dose 65+) 12/28/2018  . Influenza, High Dose Seasonal PF 12/24/2016, 12/08/2017  . Influenza,inj,Quad PF,6+ Mos 12/19/2015, 01/18/2020  . Influenza-Unspecified 03/06/2015  . Moderna Sars-Covid-2 Vaccination 04/14/2019, 05/12/2019, 01/28/2020  . Pneumococcal Conjugate-13 03/16/2018  . Pneumococcal Polysaccharide-23 12/24/2016  . Tdap 07/28/2010    Conditions to be addressed/monitored:  Hypertension, Hyperlipidemia, Diabetes, Atrial Fibrillation, Heart Failure, Coronary Artery Disease, GERD, Asthma, Depression and Anxiety  Care Plan : General Pharmacy (Adult)  Updates made by Germaine Pomfret, RPH since 08/08/2020 12:00 AM    Problem: Hypertension, Hyperlipidemia, Diabetes, Atrial Fibrillation, Heart Failure, Coronary Artery Disease, GERD, Asthma, Depression and Anxiety   Priority: High    Long-Range Goal:  Patient-Specific Goal   Start Date: 06/06/2020  Expected End Date: 12/07/2020  This Visit's Progress: On track  Recent Progress: On track  Priority: High  Note:   Current Barriers:  . Unable to achieve control of diabetes  . Does not adhere to prescribed medication regimen  Pharmacist Clinical Goal(s):  Marland Kitchen Over the next 90 days, patient will achieve control of diabetes as evidenced by A1c less than 8% . adhere to prescribed medication regimen as evidenced by utilization of enhanced pharmacy services through collaboration with PharmD and provider.   Interventions: . 1:1 collaboration with [name]., MD regarding development and update of comprehensive plan of care as evidenced by provider attestation and co-signature . Inter-disciplinary care team collaboration (see longitudinal plan of care) . Comprehensive medication review performed; medication list updated in electronic medical record  Diabetes (A1c goal <7%) -Uncontrolled -Current medications: . Trulicity 6.20 mg weekly on Tuesdays . Jardiance 25 mg daily  -Medications previously tried: Glipizide, Lantus, Tradjenta, Metformin, Metformin XR, Ozempic   -Current home glucose readings:   Fasting  1-May 121  2-May 120  3-May 103  4-May 180  5-May 131  6-May 122  7-May 143  8-May 147  9-May 131  10-May 122  11-May 151  Average 134   -Denies hypoglycemic/hyperglycemic symptoms -Current meal patterns:  . breakfast: Often skips  . lunch: Often skips . dinner: Country style steak + gravy + mashed potatoes + broccoli + cheese sauce  . snacks: Yogurt, peanut butter crackers,  . drinks: Glucerna shakes, water, regular ginger ale  -Current exercise: walking twice daily (15 minute sessions)  -Patient reports her appetite and energy level have been decreased following her hospitalizations/Covid.  -Increase Trulicity to 1.5 mg weekly to maximize cardiovascular protective benefits  -Recommend A1c,  Microalbumin at PCP  follow-up. -CMA Assessment in two weeks to assess Trulicity tolerability   Hyperlipidemia: (LDL goal < 70) -History of CABG x4 2017, 2018  -Uncontrolled -Current treatment: . Rosuvastatin 40 mg daily . Ezetimibe 10 mg daily   -Current antiplatelet treatment: . Clopidogrel 75 mg daily  -Medications previously tried: Atorvastatin (changed to rosuvastatin during hospitalization) -Patient reports tolerating new cholesterol regimen well, denies myalgias  -Educated on Importance of limiting foods high in cholesterol; -Recommended to continue current medication  Atrial Fibrillation (Goal: prevent stroke and major bleeding) -Controlled -CHADSVASC: 6 -Current treatment: . Rate control: Metoprolol tartrate 100 mg twice daily  . Anticoagulation: Eliquis 5 mg twice daily  -Medications previously tried: NA -Home BP and HR readings: NA  -Counseled on importance of adherence to anticoagulant exactly as prescribed; avoidance of NSAIDs due to increased bleeding risk with anticoagulants; Careful monitoring due to increased bleeding risk with clopidogrel + Eliquis -Recommended to continue current medication  Heart Failure (Goal: manage symptoms and prevent exacerbations) -Controlled -Last ejection fraction: 55-60% (Date: June 2021) -HF type: Diastolic -NYHA Class: III (marked limitation of activity) -AHA HF Stage: C (Heart disease and symptoms present) -Current treatment: . Imdur 30 mg daily  . Metoprolol tartrate 100 mg twice daily  -Medications previously tried: NA  -Current home BP/HR readings: NA -Educated on Importance of weighing daily; if you gain more than 3 pounds in one day or 5 pounds in one week, contact provider's office -Recommended to continue current medication  Depression/Anxiety (Goal: maintain stable mood) -Controlled -Current treatment: . Sertraline 50 mg daily  -Medications previously tried/failed: NA -PHQ9: 0 -GAD7: 1 - Patient reports she manages her stress by  talking with daughter, reading, or medidating  -Educated on Benefits of medication for symptom control -Recommended to continue current medication  GERD with dysphagia (Goal: prevent heartburn or reflux symptoms) -Controlled -Current treatment  . Pantoprazole 20 mg twice daily  -Medications previously tried: Sucralfate,  -Continue current medications  Patient Goals/Self-Care Activities . Over the next 90 days, patient will:  - check glucose daily, before breakfast, document, and provide at future appointments check blood pressure weekly, document, and provide at future appointments  Follow Up Plan: Telephone follow up appointment with care management team member scheduled for:  7/29/202 at 11:00 AM      Medication Assistance: None required.  Patient affirms current coverage meets needs.  Patient's preferred pharmacy is:  [facility] - [place], [place] - [address] [place] [address] Phone: [phone] Fax: [phone]  Uses pill box? No - doesn't think she needs Pt endorses 100% compliance  We discussed: Benefits of medication synchronization, packaging and delivery as well as enhanced pharmacist oversight with [facility]. Patient decided to: Utilize [facility] for medication synchronization, packaging and delivery   Verbal consent obtained for [facility] enhanced pharmacy services (medication synchronization, adherence packaging, delivery coordination). A medication sync plan was created to allow patient to get all medications delivered once every 30 to 90 days per patient preference. Patient understands they have freedom to choose pharmacy and clinical pharmacist will coordinate care between all prescribers and [facility].  Care Plan and Follow Up Patient Decision:  Patient agrees to Care Plan and Follow-up.  Plan: Telephone follow up appointment with care management team member scheduled for:   7/29/202 at 11:00 AM  [place] [phone]

## 2020-08-08 NOTE — Patient Instructions (Signed)
Visit Information It was great speaking with you today!  Please let me know if you have any questions about our visit.  Goals Addressed            This Visit's Progress   . Monitor and Manage My Blood Sugar-Diabetes Type 2   On track    Timeframe:  Long-Range Goal Priority:  High Start Date: 04/15/20      Expected End Date: 02/26/21                   Follow Up Date 08/22/20   - Check blood sugar daily before breakfast - Check blood sugar if I feel it is too high or too low - Enter blood sugar readings and medication or insulin into daily log - Take the blood sugar log to all doctor visits - Take the blood sugar meter to all doctor visits          Patient Care Plan: General Pharmacy (Adult)    Problem Identified: Hypertension, Hyperlipidemia, Diabetes, Atrial Fibrillation, Heart Failure, Coronary Artery Disease, GERD, Asthma, Depression and Anxiety   Priority: High    Long-Range Goal: Patient-Specific Goal   Start Date: 06/06/2020  Expected End Date: 12/07/2020  This Visit's Progress: On track  Recent Progress: On track  Priority: High  Note:   Current Barriers:  . Unable to achieve control of diabetes  . Does not adhere to prescribed medication regimen  Pharmacist Clinical Goal(s):  Marland Kitchen Over the next 90 days, patient will achieve control of diabetes as evidenced by A1c less than 8% . adhere to prescribed medication regimen as evidenced by utilization of enhanced pharmacy services through collaboration with PharmD and provider.   Interventions: . 1:1 collaboration with [name]., MD regarding development and update of comprehensive plan of care as evidenced by provider attestation and co-signature . Inter-disciplinary care team collaboration (see longitudinal plan of care) . Comprehensive medication review performed; medication list updated in electronic medical record  Diabetes (A1c goal <7%) -Uncontrolled -Current medications: . Trulicity 8.18 mg weekly  on Tuesdays . Jardiance 25 mg daily  -Medications previously tried: Glipizide, Lantus, Tradjenta, Metformin, Metformin XR, Ozempic   -Current home glucose readings:   Fasting  1-May 121  2-May 120  3-May 103  4-May 180  5-May 131  6-May 122  7-May 143  8-May 147  9-May 131  10-May 122  11-May 151  Average 134   -Denies hypoglycemic/hyperglycemic symptoms -Current meal patterns:  . breakfast: Often skips  . lunch: Often skips . dinner: Country style steak + gravy + mashed potatoes + broccoli + cheese sauce  . snacks: Yogurt, peanut butter crackers,  . drinks: Glucerna shakes, water, regular ginger ale  -Current exercise: walking twice daily (15 minute sessions)  -Patient reports her appetite and energy level have been decreased following her hospitalizations/Covid.  -Increase Trulicity to 1.5 mg weekly to maximize cardiovascular protective benefits  -Recommend A1c, Microalbumin at PCP follow-up. -CMA Assessment in two weeks to assess Trulicity tolerability   Hyperlipidemia: (LDL goal < 70) -History of CABG x4 2017, 2018  -Uncontrolled -Current treatment: . Rosuvastatin 40 mg daily . Ezetimibe 10 mg daily   -Current antiplatelet treatment: . Clopidogrel 75 mg daily  -Medications previously tried: Atorvastatin (changed to rosuvastatin during hospitalization) -Patient reports tolerating new cholesterol regimen well, denies myalgias  -Educated on Importance of limiting foods high in cholesterol; -Recommended to continue current medication  Atrial Fibrillation (Goal: prevent stroke and major bleeding) -Controlled -CHADSVASC: 6 -  Current treatment: . Rate control: Metoprolol tartrate 100 mg twice daily  . Anticoagulation: Eliquis 5 mg twice daily  -Medications previously tried: NA -Home BP and HR readings: NA  -Counseled on importance of adherence to anticoagulant exactly as prescribed; avoidance of NSAIDs due to increased bleeding risk with anticoagulants; Careful  monitoring due to increased bleeding risk with clopidogrel + Eliquis -Recommended to continue current medication  Heart Failure (Goal: manage symptoms and prevent exacerbations) -Controlled -Last ejection fraction: 55-60% (Date: June 2021) -HF type: Diastolic -NYHA Class: III (marked limitation of activity) -AHA HF Stage: C (Heart disease and symptoms present) -Current treatment: . Imdur 30 mg daily  . Metoprolol tartrate 100 mg twice daily  -Medications previously tried: NA  -Current home BP/HR readings: NA -Educated on Importance of weighing daily; if you gain more than 3 pounds in one day or 5 pounds in one week, contact provider's office -Recommended to continue current medication  Depression/Anxiety (Goal: maintain stable mood) -Controlled -Current treatment: . Sertraline 50 mg daily  -Medications previously tried/failed: NA -PHQ9: 0 -GAD7: 1 - Patient reports she manages her stress by talking with daughter, reading, or medidating  -Educated on Benefits of medication for symptom control -Recommended to continue current medication  GERD with dysphagia (Goal: prevent heartburn or reflux symptoms) -Controlled -Current treatment  . Pantoprazole 20 mg twice daily  -Medications previously tried: Sucralfate,  -Continue current medications  Patient Goals/Self-Care Activities . Over the next 90 days, patient will:  - check glucose daily, before breakfast, document, and provide at future appointments check blood pressure weekly, document, and provide at future appointments  Follow Up Plan: Telephone follow up appointment with care management team member scheduled for:  7/29/202 at 11:00 AM    Patient agreed to services and verbal consent obtained.   The patient verbalized understanding of instructions, educational materials, and care plan provided today and declined offer to receive copy of patient instructions, educational materials, and care plan.   Junius Argyle, PharmD,  [place] [phone]

## 2020-08-08 NOTE — Progress Notes (Signed)
I,[name],acting as a scribe for [name], MD.,have documented all relevant documentation on the behalf of [name], MD,as directed by  [name], MD while in the presence of [name], MD.   Established patient visit   Patient: Candace [name]   DOB: 12-Jun-1950   70 y.o. Female  MRN: [id number] Visit Date: 08/11/2020  Today's healthcare provider: [name], MD   Chief Complaint  Patient presents with  . Follow-up  . Diabetes   Subjective    HPI  Patient comes in today for a follow-up of another hospitalization for unstable angina for which she underwent an cardiac cath and was found to have diffuse disease for which they were unable to intervene with any angioplasty or stenting further. He feels fairly well recently.  Her diabetes has not been well controlled. Blood pressures have been running a little bit low. Diabetes Mellitus Type II, follow-up  Lab Results  Component Value Date   HGBA1C 12.2 (A) 05/01/2020   HGBA1C 12.4 (A) 12/06/2019   HGBA1C 9.4 (A) 09/04/2019   Last seen for diabetes 3 months ago.  Management since then includes; no medication changes. However, during recent hospital stay, HgbA1c was checked and it was 9.4.  She reports fair compliance with treatment. She is not having side effects. none  Home blood sugar records: trend: increasing steadily  Episodes of hypoglycemia? No none   Current insulin regiment: Trilicity,  Most Recent Eye Exam: 06/28/2019  -------------------------------------------------------------------       Medications: Outpatient Medications Prior to Visit  Medication Sig  . acetaminophen (TYLENOL) 325 MG tablet Take 650 mg by mouth every 6 (six) hours as needed for moderate pain or headache.   . bismuth subsalicylate (PEPTO BISMOL) 262 MG chewable tablet Chew 524 mg by mouth as needed.  . clopidogrel (PLAVIX) 75 MG tablet Take 75 mg by mouth daily.  . Dulaglutide  (TRULICITY) 1.5 EH/6.3JS SOPN Inject 1.5 mg into the skin once a week.  Marland Kitchen ELIQUIS 5 MG TABS tablet Take 5 mg by mouth 2 (two) times daily.  Marland Kitchen ezetimibe (ZETIA) 10 MG tablet Take 10 mg by mouth daily.  . isosorbide mononitrate (IMDUR) 30 MG 24 hr tablet Take 30 mg by mouth daily.  Marland Kitchen JARDIANCE 25 MG TABS tablet TAKE ONE TABLET BY MOUTH BEFORE BREAKFAST  . Lancets (ONETOUCH DELICA PLUS [id number]) MISC USE UP TO 4 TIMES DAILY AS DIRECTED  . loperamide (IMODIUM) 2 MG capsule Take 2 mg by mouth as needed.  . metoprolol tartrate (LOPRESSOR) 100 MG tablet Take 100 mg by mouth 2 (two) times daily.  . nitroGLYCERIN (NITROSTAT) 0.4 MG SL tablet Place 1 tablet (0.4 mg total) under the tongue every 5 (five) minutes as needed for chest pain.  Marland Kitchen nystatin (NYSTATIN) powder Apply 1 application topically daily. (Patient taking differently: Apply 1 application topically daily as needed.)  . ondansetron (ZOFRAN-ODT) 8 MG disintegrating tablet Take 1 tablet (8 mg total) by mouth every 8 (eight) hours as needed.  . pantoprazole (PROTONIX) 20 MG tablet Take 1 tablet (20 mg total) by mouth 2 (two) times daily.  . rosuvastatin (CRESTOR) 40 MG tablet Take 40 mg by mouth daily.  . sertraline (ZOLOFT) 50 MG tablet Take 1 tablet (50 mg total) by mouth daily.  . sucralfate (CARAFATE) 1 g tablet Take one tablet in the morning and one tablet in the evening. (Patient not taking: No sig reported)   No facility-administered medications prior to  visit.    Review of Systems  Constitutional: Negative for activity change and fatigue.  Respiratory: Negative for cough and shortness of breath.   Cardiovascular: Negative for chest pain, palpitations and leg swelling.  Endocrine: Negative for cold intolerance, heat intolerance, polydipsia, polyphagia and polyuria.  Neurological: Negative for dizziness, light-headedness and headaches.  Psychiatric/Behavioral: Negative for self-injury, sleep disturbance and suicidal ideas. The patient  is not nervous/anxious.         Objective    BP 108/76 (BP Location: Right Arm, Patient Position: Sitting, Cuff Size: Large)   Pulse 83   Temp 98.3 F (36.8 C) (Oral)   Resp 18   Ht 5\' 2"  (1.575 m)   Wt 169 lb (76.7 kg)   SpO2 95%   BMI 30.91 kg/m  BP Readings from Last 3 Encounters:  08/11/20 108/76  06/18/20 (!) 101/49  05/13/20 (!) 166/95   Wt Readings from Last 3 Encounters:  08/11/20 169 lb (76.7 kg)  08/05/20 166 lb (75.3 kg)  06/18/20 164 lb 3.2 oz (74.5 kg)       Physical Exam    Results for orders placed or performed in visit on 08/11/20  POCT UA - Microalbumin  Result Value Ref Range   Microalbumin Ur, POC 50 mg/L    Assessment & Plan     1. Uncontrolled type 2 diabetes mellitus with hyperglycemia (HCC) Poorly controlled diabetes with most recent A1c of 12.2 which today is 9.3 which is improved but not good enough.  At this time increase Trulicity to 1.5 weekly. - Lipid panel - CBC w/Diff/Platelet - Comprehensive Metabolic Panel (CMET) - Hemoglobin A1c - POCT UA - Microalbumin  2. Coronary artery disease involving native coronary artery of native heart with unstable angina pectoris ([place]) All risk factors treated. Due to hypotension decrease isosorbide from 60 to 30 mg daily.  If angina gets worse may need to go back up 3. Hypertension associated with type 2 diabetes mellitus (HCC) Try losartan 25 mg daily - Lipid panel - CBC w/Diff/Platelet - Comprehensive Metabolic Panel (CMET) - Hemoglobin A1c - losartan (COZAAR) 25 MG tablet; Take 1 tablet (25 mg total) by mouth daily.  Dispense: 90 tablet; Refill: 0  4. Hyperlipidemia associated with type 2 diabetes mellitus (HCC) On Crestor 40 - Lipid panel - CBC w/Diff/Platelet - Comprehensive Metabolic Panel (CMET) - Hemoglobin A1c  5. Chronic diastolic CHF (congestive heart failure) [facility]) She sees cardiology soon.  Clinically stable.  Start losartan - Lipid panel - CBC w/Diff/Platelet -  Comprehensive Metabolic Panel (CMET) - Hemoglobin A1c   No follow-ups on file.      I, [name], MD, have reviewed all documentation for this visit. The documentation on 08/19/20 for the exam, diagnosis, procedures, and orders are all accurate and complete.    Christorpher Hisaw Cranford Mon, MD  [facility] [phone] (phone) [phone] (fax)  [place]

## 2020-08-11 ENCOUNTER — Ambulatory Visit (INDEPENDENT_AMBULATORY_CARE_PROVIDER_SITE_OTHER): Payer: Medicare Other | Admitting: Family Medicine

## 2020-08-11 ENCOUNTER — Other Ambulatory Visit: Payer: Self-pay

## 2020-08-11 ENCOUNTER — Encounter: Payer: Self-pay | Admitting: Family Medicine

## 2020-08-11 VITALS — BP 108/76 | HR 83 | Temp 98.3°F | Resp 18 | Ht 62.0 in | Wt 169.0 lb

## 2020-08-11 DIAGNOSIS — I25110 Atherosclerotic heart disease of native coronary artery with unstable angina pectoris: Secondary | ICD-10-CM | POA: Diagnosis not present

## 2020-08-11 DIAGNOSIS — E1159 Type 2 diabetes mellitus with other circulatory complications: Secondary | ICD-10-CM | POA: Diagnosis not present

## 2020-08-11 DIAGNOSIS — E1165 Type 2 diabetes mellitus with hyperglycemia: Secondary | ICD-10-CM | POA: Diagnosis not present

## 2020-08-11 DIAGNOSIS — E1169 Type 2 diabetes mellitus with other specified complication: Secondary | ICD-10-CM | POA: Diagnosis not present

## 2020-08-11 DIAGNOSIS — I5032 Chronic diastolic (congestive) heart failure: Secondary | ICD-10-CM

## 2020-08-11 DIAGNOSIS — I152 Hypertension secondary to endocrine disorders: Secondary | ICD-10-CM

## 2020-08-11 DIAGNOSIS — E785 Hyperlipidemia, unspecified: Secondary | ICD-10-CM

## 2020-08-11 LAB — POCT UA - MICROALBUMIN: Microalbumin Ur, POC: 50 mg/L

## 2020-08-11 MED ORDER — LOSARTAN POTASSIUM 25 MG PO TABS: 25.0000 mg | ORAL_TABLET | Freq: Every day | ORAL | 0 refills | Status: DC

## 2020-08-11 NOTE — Patient Instructions (Signed)
Decrease isosorbide 60mg  to 30mg  daily.  Increase Trulicity to 1.5mg  once weekly.

## 2020-08-12 ENCOUNTER — Telehealth: Payer: Self-pay

## 2020-08-12 LAB — CBC WITH DIFFERENTIAL/PLATELET
Basophils Absolute: 0 10*3/uL (ref 0.0–0.2)
Basos: 1 %
EOS (ABSOLUTE): 0.2 10*3/uL (ref 0.0–0.4)
Eos: 3 %
Hematocrit: 41.3 % (ref 34.0–46.6)
Hemoglobin: 13.5 g/dL (ref 11.1–15.9)
Immature Grans (Abs): 0 10*3/uL (ref 0.0–0.1)
Immature Granulocytes: 0 %
Lymphocytes Absolute: 2.5 10*3/uL (ref 0.7–3.1)
Lymphs: 40 %
MCH: 31.8 pg (ref 26.6–33.0)
MCHC: 32.7 g/dL (ref 31.5–35.7)
MCV: 97 fL (ref 79–97)
Monocytes Absolute: 0.5 10*3/uL (ref 0.1–0.9)
Monocytes: 8 %
Neutrophils Absolute: 3 10*3/uL (ref 1.4–7.0)
Neutrophils: 48 %
Platelets: 192 10*3/uL (ref 150–450)
RBC: 4.25 x10E6/uL (ref 3.77–5.28)
RDW: 12.7 % (ref 11.7–15.4)
WBC: 6.3 10*3/uL (ref 3.4–10.8)

## 2020-08-12 LAB — LIPID PANEL
Chol/HDL Ratio: 2.9 ratio (ref 0.0–4.4)
Cholesterol, Total: 164 mg/dL (ref 100–199)
HDL: 57 mg/dL (ref >39)
LDL Chol Calc (NIH): 90 mg/dL (ref 0–99)
Triglycerides: 94 mg/dL (ref 0–149)
VLDL Cholesterol Cal: 17 mg/dL (ref 5–40)

## 2020-08-12 LAB — COMPREHENSIVE METABOLIC PANEL
ALT: 17 IU/L (ref 0–32)
AST: 16 IU/L (ref 0–40)
Albumin/Globulin Ratio: 2.2 (ref 1.2–2.2)
Albumin: 4.4 g/dL (ref 3.8–4.8)
Alkaline Phosphatase: 117 IU/L (ref 44–121)
BUN/Creatinine Ratio: 16 (ref 12–28)
BUN: 10 mg/dL (ref 8–27)
Bilirubin Total: 0.4 mg/dL (ref 0.0–1.2)
CO2: 22 mmol/L (ref 20–29)
Calcium: 9.4 mg/dL (ref 8.7–10.3)
Chloride: 105 mmol/L (ref 96–106)
Creatinine, Ser: 0.64 mg/dL (ref 0.57–1.00)
Globulin, Total: 2 g/dL (ref 1.5–4.5)
Glucose: 176 mg/dL — ABNORMAL HIGH (ref 65–99)
Potassium: 4.1 mmol/L (ref 3.5–5.2)
Sodium: 144 mmol/L (ref 134–144)
Total Protein: 6.4 g/dL (ref 6.0–8.5)
eGFR: 96 mL/min/{1.73_m2} (ref >59)

## 2020-08-12 LAB — HEMOGLOBIN A1C
Est. average glucose Bld gHb Est-mCnc: 214 mg/dL
Hgb A1c MFr Bld: 9.1 % — ABNORMAL HIGH (ref 4.8–5.6)

## 2020-08-12 NOTE — Progress Notes (Signed)
Chronic Care Management Pharmacy Assistant   Name: Candace [name]  MRN: [id number] DOB: June 15, 1950  Reason for Encounter: Medication Review/Medication Coordination Call  Recent office visits:  08/11/2020 Dr Rosanna Randy MD (PCP) Decrease isosorbide 60mg  to 30mg  daily. ,Increase Trulicity to 1.5mg  once weekly. ,started Losartan 25 mg 1 tablet daily  Recent consult visits:  No recent [facility] visits:  None in previous 6 months  Medications: Outpatient Encounter Medications as of 08/12/2020  Medication Sig Note  . acetaminophen (TYLENOL) 325 MG tablet Take 650 mg by mouth every 6 (six) hours as needed for moderate pain or headache.    . bismuth subsalicylate (PEPTO BISMOL) 262 MG chewable tablet Chew 524 mg by mouth as needed.   . clopidogrel (PLAVIX) 75 MG tablet Take 75 mg by mouth daily.   . Dulaglutide (TRULICITY) 1.5 NF/6.2ZH SOPN Inject 1.5 mg into the skin once a week.   Marland Kitchen ELIQUIS 5 MG TABS tablet Take 5 mg by mouth 2 (two) times daily. 06/06/2020: Prescribed by Dr. Nehemiah Massed  . ezetimibe (ZETIA) 10 MG tablet Take 10 mg by mouth daily.   . isosorbide mononitrate (IMDUR) 30 MG 24 hr tablet Take 30 mg by mouth daily.   Marland Kitchen JARDIANCE 25 MG TABS tablet TAKE ONE TABLET BY MOUTH BEFORE BREAKFAST   . Lancets (ONETOUCH DELICA PLUS [id number]) MISC USE UP TO 4 TIMES DAILY AS DIRECTED   . loperamide (IMODIUM) 2 MG capsule Take 2 mg by mouth as needed.   Marland Kitchen losartan (COZAAR) 25 MG tablet Take 1 tablet (25 mg total) by mouth daily.   . metoprolol tartrate (LOPRESSOR) 100 MG tablet Take 100 mg by mouth 2 (two) times daily. 06/06/2020: Prescribed by Dr. Nehemiah Massed   . nitroGLYCERIN (NITROSTAT) 0.4 MG SL tablet Place 1 tablet (0.4 mg total) under the tongue every 5 (five) minutes as needed for chest pain.   Marland Kitchen nystatin (NYSTATIN) powder Apply 1 application topically daily. (Patient taking differently: Apply 1 application topically daily as needed.)   . ondansetron (ZOFRAN-ODT) 8 MG  disintegrating tablet Take 1 tablet (8 mg total) by mouth every 8 (eight) hours as needed.   . pantoprazole (PROTONIX) 20 MG tablet Take 1 tablet (20 mg total) by mouth 2 (two) times daily.   . rosuvastatin (CRESTOR) 40 MG tablet Take 40 mg by mouth daily.   . sertraline (ZOLOFT) 50 MG tablet Take 1 tablet (50 mg total) by mouth daily.   . sucralfate (CARAFATE) 1 g tablet Take one tablet in the morning and one tablet in the evening. (Patient not taking: No sig reported)   . [DISCONTINUED] cetirizine (ZYRTEC) 5 MG tablet Take 2 tablets (10 mg total) by mouth daily.    No facility-administered encounter medications on file as of 08/12/2020.   Star Rating Drugs: Trulicity 1.5 mg last fill on 07/01/2020 for 28 day supply at [facility].  Jardiance 25 mg last filled on 06/30/2020 for 80 day supply at [facility]. Losartan 25 mg Rosuvastatin 40 mglast filled on 07/13/2020 for 90 day supply at [facility].  Reviewed chart for medication changes ahead of medication coordination call.  BP Readings from Last 3 Encounters:  08/11/20 108/76  06/18/20 (!) 101/49  05/13/20 (!) 166/95    Lab Results  Component Value Date   HGBA1C 9.1 (H) 08/11/2020     Patient obtains medications through Adherence Packaging  30 Days   Last adherence delivery included: None ID Patient declined medication last month  None ID  Patient is due for next adherence delivery on: 08/21/2020. Called patient and reviewed medications and coordinated delivery.  This delivery to include: Trulicity 1.5 mg inject 1.5 mg once a week Isosorbide mononitrate 30 mg one tablet daily - Breakfast Pantoprazole 20 mg one tablet two times a day- breakfast, Evening meals Losartan 25 mg one tablet daily- Bedtime Eliquis 5 mg 1 tablet two times daily breakfast, evening meals  Patient declined the following medications Jardiance 25 mg 1 tablet daily before breakfast- (receive 80 Day supply on 06/30/2020) Rosuvastatin  40 mg 1 tablet daily breakfast-(receive 90 day supply from [facility] on 07/13/2020) ezetimibe 10 mg 1 tablet daily breakfast - (receive 90 day supply from [place] on 07/13/2020) Clopidogrel 75 mg 1 tablet daily breakfast (receive 90 day supply from [facility] on 07/13/2020) Metoprolol 100 mg 1 tablet two times daily breakfast, evening meals-(receive 30 day supply from [facility] on 08/06/2020) Sertraline 50 mg 1 tablet daily breakfast - (receive 90 day supply from [place] on 08/08/2020) Ondansetron 8 mg PRN Nitroglycerin 0.4 SL PRN   Patient needs refills for None ID.  Confirmed delivery date of 08/21/2020, advised patient that pharmacy will contact them the morning of delivery.  Blood sugar readings: Patient denies any issues or side effects from increasing her Trulicity to 1.5 mg  Before meals: On 08/09/2020 it was 157. On 08/10/2020 it was 143. On 08/11/2020 it was 163. On 08/12/2020 it was 263.  Follow up is schedule on 10/24/2020  with clinical pharmacist at 11:00 am.  [place] Pharmacist Assistant [phone]

## 2020-08-13 ENCOUNTER — Encounter: Payer: Self-pay | Admitting: *Deleted

## 2020-08-13 DIAGNOSIS — I214 Non-ST elevation (NSTEMI) myocardial infarction: Secondary | ICD-10-CM

## 2020-08-13 NOTE — Progress Notes (Signed)
Cardiac Individual Treatment Plan  Patient Details  Name: Candace [name] MRN: [id number] Date of Birth: 18-Mar-1951 Referring Provider:   Flowsheet Row Cardiac Rehab from 08/05/2020 in [facility] Cardiac and Pulmonary Rehab  Referring Provider [name]      Initial Encounter Date:  Flowsheet Row Cardiac Rehab from 08/05/2020 in [facility] Cardiac and Pulmonary Rehab  Date 08/05/20      Visit Diagnosis: NSTEMI (non-ST elevated myocardial infarction) [facility])  Patient's Home Medications on Admission:  Current Outpatient Medications:  .  acetaminophen (TYLENOL) 325 MG tablet, Take 650 mg by mouth every 6 (six) hours as needed for moderate pain or headache. , Disp: , Rfl:  .  bismuth subsalicylate (PEPTO BISMOL) 262 MG chewable tablet, Chew 524 mg by mouth as needed., Disp: , Rfl:  .  clopidogrel (PLAVIX) 75 MG tablet, Take 75 mg by mouth daily., Disp: , Rfl:  .  Dulaglutide (TRULICITY) 1.5 0000000 SOPN, Inject 1.5 mg into the skin once a week., Disp: 2 mL, Rfl: 2 .  ELIQUIS 5 MG TABS tablet, Take 5 mg by mouth 2 (two) times daily., Disp: , Rfl:  .  ezetimibe (ZETIA) 10 MG tablet, Take 10 mg by mouth daily., Disp: , Rfl:  .  isosorbide mononitrate (IMDUR) 30 MG 24 hr tablet, Take 30 mg by mouth daily., Disp: , Rfl:  .  JARDIANCE 25 MG TABS tablet, TAKE ONE TABLET BY MOUTH BEFORE BREAKFAST, Disp: 30 tablet, Rfl: 2 .  Lancets (ONETOUCH DELICA PLUS 123XX123) MISC, USE UP TO 4 TIMES DAILY AS DIRECTED, Disp: 100 each, Rfl: 0 .  loperamide (IMODIUM) 2 MG capsule, Take 2 mg by mouth as needed., Disp: , Rfl:  .  losartan (COZAAR) 25 MG tablet, Take 1 tablet (25 mg total) by mouth daily., Disp: 90 tablet, Rfl: 0 .  metoprolol tartrate (LOPRESSOR) 100 MG tablet, Take 100 mg by mouth 2 (two) times daily., Disp: , Rfl:  .  nitroGLYCERIN (NITROSTAT) 0.4 MG SL tablet, Place 1 tablet (0.4 mg total) under the tongue every 5 (five) minutes as needed for chest pain., Disp: 30 tablet, Rfl: 0 .  nystatin  (NYSTATIN) powder, Apply 1 application topically daily. (Patient taking differently: Apply 1 application topically daily as needed.), Disp: 60 g, Rfl: 5 .  ondansetron (ZOFRAN-ODT) 8 MG disintegrating tablet, Take 1 tablet (8 mg total) by mouth every 8 (eight) hours as needed., Disp: 20 tablet, Rfl: 1 .  pantoprazole (PROTONIX) 20 MG tablet, Take 1 tablet (20 mg total) by mouth 2 (two) times daily., Disp: 60 tablet, Rfl: 11 .  rosuvastatin (CRESTOR) 40 MG tablet, Take 40 mg by mouth daily., Disp: , Rfl:  .  sertraline (ZOLOFT) 50 MG tablet, Take 1 tablet (50 mg total) by mouth daily., Disp: 90 tablet, Rfl: 1 .  sucralfate (CARAFATE) 1 g tablet, Take one tablet in the morning and one tablet in the evening. (Patient not taking: No sig reported), Disp: 120 tablet, Rfl: 12  Past Medical History: Past Medical History:  Diagnosis Date  . Allergy   . Anemia   . Anxiety   . Arrhythmia   . Arthritis   . Atrial fibrillation ([place])   . Coronary artery disease   . Depression   . Diabetes mellitus without complication ([place])   . Dyspnea    doe  . Dysrhythmia   . GERD (gastroesophageal reflux disease)   . Headache   . History of hiatal hernia   . Hyperlipidemia   . Hypertension   .  Myocardial infarction ([place])    2016, 04/2017  . Myocardial infarction with cardiac rehabilitation [facility])    MI 2016/ CABG 8/17    FINISHED CARDIAC REHAB 3 WEEKS AGO  . Panic attack   . Pneumonia   . Reflux   . Stroke [facility]) 2015   showed up on MRI; no weakness noted  . TIA (transient ischemic attack)   . Voice tremor     Tobacco Use: Social History   Tobacco Use  Smoking Status Former Smoker  . Packs/day: 0.50  . Types: Cigarettes  . Quit date: 10/07/2001  . Years since quitting: 18.8  Smokeless Tobacco Never Used    Labs: Recent Review Flowsheet Data    Labs for ITP Cardiac and Pulmonary Rehab Latest Ref Rng & Units 08/31/2019 09/04/2019 12/06/2019 05/01/2020 08/11/2020   Cholestrol 100 - 199 mg/dL 234(H) - - -  164   LDLCALC 0 - 99 mg/dL 163(H) - - - 90   HDL >39 mg/dL 49 - - - 57   Trlycerides 0 - 149 mg/dL 110 - - - 94   Hemoglobin A1c 4.8 - 5.6 % - 9.4(A) 12.4(A) 12.2(A) 9.1(H)   PHART 7.350 - 7.450 - - - - -   PCO2ART 35.0 - 45.0 mmHg - - - - -   HCO3 20.0 - 24.0 mEq/L - - - - -   TCO2 0 - 100 mmol/L - - - - -   O2SAT % - - - - -       Exercise Target Goals: Exercise Program Goal: Individual exercise prescription set using results from initial 6 min walk test and THRR while considering  patient's activity barriers and safety.   Exercise Prescription Goal: Initial exercise prescription builds to 30-45 minutes a day of aerobic activity, 2-3 days per week.  Home exercise guidelines will be given to patient during program as part of exercise prescription that the participant will acknowledge.   Education: Aerobic Exercise: - Group verbal and visual presentation on the components of exercise prescription. Introduces F.I.T.T principle from ACSM for exercise prescriptions.  Reviews F.I.T.T. principles of aerobic exercise including progression. Written material given at graduation. Flowsheet Row Cardiac Rehab from 08/05/2020 in [facility] Cardiac and Pulmonary Rehab  Education need identified 08/05/20      Education: Resistance Exercise: - Group verbal and visual presentation on the components of exercise prescription. Introduces F.I.T.T principle from ACSM for exercise prescriptions  Reviews F.I.T.T. principles of resistance exercise including progression. Written material given at graduation.    Education: Exercise & Equipment Safety: - Individual verbal instruction and demonstration of equipment use and safety with use of the equipment. Flowsheet Row Cardiac Rehab from 08/05/2020 in [facility] Cardiac and Pulmonary Rehab  Date 08/05/20  Educator [facility]  Instruction Review Code 1- Verbalizes Understanding      Education: Exercise Physiology & General Exercise Guidelines: - Group verbal and written  instruction with models to review the exercise physiology of the cardiovascular system and associated critical values. Provides general exercise guidelines with specific guidelines to those with heart or lung disease.  Flowsheet Row Cardiac Rehab from 08/04/2017 in [facility] Cardiac and Pulmonary Rehab  Date 07/07/17  Educator [facility]  Instruction Review Code 1- Verbalizes Understanding      Education: Flexibility, Balance, Mind/Body Relaxation: - Group verbal and visual presentation with interactive activity on the components of exercise prescription. Introduces F.I.T.T principle from ACSM for exercise prescriptions. Reviews F.I.T.T. principles of flexibility and balance exercise training including progression. Also discusses the mind  body connection.  Reviews various relaxation techniques to help reduce and manage stress (i.e. Deep breathing, progressive muscle relaxation, and visualization). Balance handout provided to take home. Written material given at graduation. Flowsheet Row Cardiac Rehab from 02/08/2018 in [facility] Cardiac and Pulmonary Rehab  Date 01/03/18  Educator AS  Instruction Review Code 1- Verbalizes Understanding      Activity Barriers & Risk Stratification:  Activity Barriers & Cardiac Risk Stratification - 08/05/20 0905      Activity Barriers & Cardiac Risk Stratification   Activity Barriers Back Problems;Deconditioning;Shortness of Breath;Muscular Weakness;Balance Concerns;History of Falls    Cardiac Risk Stratification High           6 Minute Walk:  6 Minute Walk    Row Name 08/05/20 0903         6 Minute Walk   Phase Initial     Distance 930 feet     Walk Time 5.53 minutes     # of Rest Breaks 1  28 sec     MPH 1.91     METS 2.58     RPE 14     Perceived Dyspnea  3     VO2 Peak 9.05     Symptoms Yes (comment)     Comments chest tightness 7-10, legs aching (thighs and calves) 8/10, SOB, wobbly gait     Resting HR 85 bpm     Resting BP 122/64     Resting Oxygen  Saturation  96 %     Exercise Oxygen Saturation  during 6 min walk 98 %     Max Ex. HR 128 bpm     Max Ex. BP 156/74     2 Minute Post BP 126/70            Oxygen Initial Assessment:   Oxygen Re-Evaluation:   Oxygen Discharge (Final Oxygen Re-Evaluation):   Initial Exercise Prescription:  Initial Exercise Prescription - 08/05/20 0900      Date of Initial Exercise RX and Referring Provider   Date 08/05/20    Referring Provider [name]      NuStep   Level 2    SPM 80    Minutes 15    METs 2.5      Arm Ergometer   Level 1    Watts 25    RPM 25    Minutes 15    METs 2      Track   Laps 25    Minutes 15    METs 2.4      Prescription Details   Frequency (times per week) 3    Duration Progress to 30 minutes of continuous aerobic without signs/symptoms of physical distress      Intensity   THRR 40-80% of Max Heartrate 111-138    Ratings of Perceived Exertion 11-13    Perceived Dyspnea 0-4      Progression   Progression Continue to progress workloads to maintain intensity without signs/symptoms of physical distress.      Resistance Training   Training Prescription Yes    Weight 3 lb    Reps 10-15           Perform Capillary Blood Glucose checks as needed.  Exercise Prescription Changes:  Exercise Prescription Changes    Row Name 08/05/20 0900             Response to Exercise   Blood Pressure (Admit) 122/64       Blood Pressure (Exercise) 156/74  Blood Pressure (Exit) 126/70       Heart Rate (Admit) 85 bpm       Heart Rate (Exercise) 128 bpm       Heart Rate (Exit) 76 bpm       Oxygen Saturation (Admit) 96 %       Oxygen Saturation (Exercise) 98 %       Rating of Perceived Exertion (Exercise) 14       Perceived Dyspnea (Exercise) 3       Symptoms wobbly gait, SOB, legs aching (8/10), chest tightness (7/10)       Comments walk test results              Exercise Comments:   Exercise Goals and Review:  Exercise Goals     Row Name 08/05/20 0907             Exercise Goals   Increase Physical Activity Yes       Intervention Provide advice, education, support and counseling about physical activity/exercise needs.;Develop an individualized exercise prescription for aerobic and resistive training based on initial evaluation findings, risk stratification, comorbidities and participant's personal goals.       Expected Outcomes Short Term: Attend rehab on a regular basis to increase amount of physical activity.;Long Term: Add in home exercise to make exercise part of routine and to increase amount of physical activity.;Long Term: Exercising regularly at least 3-5 days a week.       Increase Strength and Stamina Yes       Intervention Provide advice, education, support and counseling about physical activity/exercise needs.;Develop an individualized exercise prescription for aerobic and resistive training based on initial evaluation findings, risk stratification, comorbidities and participant's personal goals.       Expected Outcomes Short Term: Increase workloads from initial exercise prescription for resistance, speed, and METs.;Short Term: Perform resistance training exercises routinely during rehab and add in resistance training at home;Long Term: Improve cardiorespiratory fitness, muscular endurance and strength as measured by increased METs and functional capacity (6MWT)       Able to understand and use rate of perceived exertion (RPE) scale Yes       Intervention Provide education and explanation on how to use RPE scale       Expected Outcomes Short Term: Able to use RPE daily in rehab to express subjective intensity level;Long Term:  Able to use RPE to guide intensity level when exercising independently       Able to understand and use Dyspnea scale Yes       Intervention Provide education and explanation on how to use Dyspnea scale       Expected Outcomes Short Term: Able to use Dyspnea scale daily in rehab to  express subjective sense of shortness of breath during exertion;Long Term: Able to use Dyspnea scale to guide intensity level when exercising independently       Knowledge and understanding of Target Heart Rate Range (THRR) Yes       Intervention Provide education and explanation of THRR including how the numbers were predicted and where they are located for reference       Expected Outcomes Short Term: Able to use daily as guideline for intensity in rehab;Short Term: Able to state/look up THRR;Long Term: Able to use THRR to govern intensity when exercising independently       Able to check pulse independently Yes       Intervention Provide education and demonstration on how to check pulse in carotid  and radial arteries.;Review the importance of being able to check your own pulse for safety during independent exercise       Expected Outcomes Short Term: Able to explain why pulse checking is important during independent exercise;Long Term: Able to check pulse independently and accurately       Understanding of Exercise Prescription Yes       Intervention Provide education, explanation, and written materials on patient's individual exercise prescription       Expected Outcomes Short Term: Able to explain program exercise prescription;Long Term: Able to explain home exercise prescription to exercise independently              Exercise Goals Re-Evaluation :   Discharge Exercise Prescription (Final Exercise Prescription Changes):  Exercise Prescription Changes - 08/05/20 0900      Response to Exercise   Blood Pressure (Admit) 122/64    Blood Pressure (Exercise) 156/74    Blood Pressure (Exit) 126/70    Heart Rate (Admit) 85 bpm    Heart Rate (Exercise) 128 bpm    Heart Rate (Exit) 76 bpm    Oxygen Saturation (Admit) 96 %    Oxygen Saturation (Exercise) 98 %    Rating of Perceived Exertion (Exercise) 14    Perceived Dyspnea (Exercise) 3    Symptoms wobbly gait, SOB, legs aching (8/10),  chest tightness (7/10)    Comments walk test results           Nutrition:  Target Goals: Understanding of nutrition guidelines, daily intake of sodium 1500mg , cholesterol 200mg , calories 30% from fat and 7% or less from saturated fats, daily to have 5 or more servings of fruits and vegetables.  Education: All About Nutrition: -Group instruction provided by verbal, written material, interactive activities, discussions, models, and posters to present general guidelines for heart healthy nutrition including fat, fiber, MyPlate, the role of sodium in heart healthy nutrition, utilization of the nutrition label, and utilization of this knowledge for meal planning. Follow up email sent as well. Written material given at graduation. Flowsheet Row Cardiac Rehab from 08/05/2020 in [facility] Cardiac and Pulmonary Rehab  Education need identified 08/05/20      Biometrics:  Pre Biometrics - 08/05/20 0908      Pre Biometrics   Height 5' 2.1" (1.577 m)    Weight 166 lb (75.3 kg)    BMI (Calculated) 30.28    Single Leg Stand 0.8 seconds            Nutrition Therapy Plan and Nutrition Goals:  Nutrition Therapy & Goals - 08/05/20 0909      Intervention Plan   Intervention Prescribe, educate and counsel regarding individualized specific dietary modifications aiming towards targeted core components such as weight, hypertension, lipid management, diabetes, heart failure and other comorbidities.    Expected Outcomes Short Term Goal: Understand basic principles of dietary content, such as calories, fat, sodium, cholesterol and nutrients.;Short Term Goal: A plan has been developed with personal nutrition goals set during dietitian appointment.;Long Term Goal: Adherence to prescribed nutrition plan.           Nutrition Assessments:  MEDIFICTS Score Key:  ?70 Need to make dietary changes   40-70 Heart Healthy Diet  ? 40 Therapeutic Level Cholesterol Diet  Flowsheet Row Cardiac Rehab from  08/05/2020 in [facility] Cardiac and Pulmonary Rehab  Picture Your Plate Total Score on Admission 65     Picture Your Plate Scores:  <69 Unhealthy dietary pattern with much room for improvement.  41-50 Dietary pattern  unlikely to meet recommendations for good health and room for improvement.  51-60 More healthful dietary pattern, with some room for improvement.   >60 Healthy dietary pattern, although there may be some specific behaviors that could be improved.    Nutrition Goals Re-Evaluation:   Nutrition Goals Discharge (Final Nutrition Goals Re-Evaluation):   Psychosocial: Target Goals: Acknowledge presence or absence of significant depression and/or stress, maximize coping skills, provide positive support system. Participant is able to verbalize types and ability to use techniques and skills needed for reducing stress and depression.   Education: Stress, Anxiety, and Depression - Group verbal and visual presentation to define topics covered.  Reviews how body is impacted by stress, anxiety, and depression.  Also discusses healthy ways to reduce stress and to treat/manage anxiety and depression.  Written material given at graduation. Flowsheet Row Cardiac Rehab from 02/08/2018 in [facility] Cardiac and Pulmonary Rehab  Date 12/28/17  Educator [facility]  Instruction Review Code 5- Refused Teaching      Education: Sleep Hygiene -Provides group verbal and written instruction about how sleep can affect your health.  Define sleep hygiene, discuss sleep cycles and impact of sleep habits. Review good sleep hygiene tips.  Flowsheet Row Cardiac Rehab from 02/08/2018 in [facility] Cardiac and Pulmonary Rehab  Date 02/08/18  Educator [facility]  Instruction Review Code 1- Verbalizes Understanding      Initial Review & Psychosocial Screening:  Initial Psych Review & Screening - 08/01/20 1520      Initial Review   Current issues with History of Depression;Current Sleep Concerns;Current Stress Concerns    Source of  Stress Concerns Chronic Illness;Unable to perform yard/household activities;Unable to participate in former interests or hobbies      [place]? Yes      Barriers   Psychosocial barriers to participate in program The patient should benefit from training in stress management and relaxation.;Psychosocial barriers identified (see note)      Screening Interventions   Interventions Encouraged to exercise;Provide feedback about the scores to participant;To provide support and resources with identified psychosocial needs    Expected Outcomes Short Term goal: Utilizing psychosocial counselor, staff and physician to assist with identification of specific Stressors or current issues interfering with healing process. Setting desired goal for each stressor or current issue identified.;Long Term Goal: Stressors or current issues are controlled or eliminated.;Long Term goal: The participant improves quality of Life and PHQ9 Scores as seen by post scores and/or verbalization of changes;Short Term goal: Identification and review with participant of any Quality of Life or Depression concerns found by scoring the questionnaire.           Quality of Life Scores:   Quality of Life - 08/05/20 0908      Quality of Life   Select Quality of Life      Quality of Life Scores   Health/Function Pre 17.32 %    Socioeconomic Pre 18.21 %    Psych/Spiritual Pre 23.79 %    Family Pre 29.35 %    GLOBAL Pre 20.44 %          Scores of 19 and below usually indicate a poorer quality of life in these areas.  A difference of  2-3 points is a clinically meaningful difference.  A difference of 2-3 points in the total score of the Quality of Life Index has been associated with significant improvement in overall quality of life, self-image, physical symptoms, and general health in studies assessing change in  quality of life.  PHQ-9: Recent Review Flowsheet Data    Depression screen [facility] 2/9  08/05/2020 04/15/2020 04/02/2020 02/06/2020 07/12/2019   Decreased Interest 1 0 0 1 1   Down, Depressed, Hopeless 1 0 0 0 0   PHQ - 2 Score 2 0 0 1 1   Altered sleeping 1 - - 1 1   Tired, decreased energy 1 - - 1 1   Change in appetite 1 - - 1 0   Feeling bad or failure about yourself  0 - - 0 0   Trouble concentrating 0 - - 0 0   Moving slowly or fidgety/restless 0 - - 0 0   Suicidal thoughts 0 - - 0 0   PHQ-9 Score 5 - - 4 3   Difficult doing work/chores Not difficult at all - - Not difficult at all Not difficult at all     Interpretation of Total Score  Total Score Depression Severity:  1-4 = Minimal depression, 5-9 = Mild depression, 10-14 = Moderate depression, 15-19 = Moderately severe depression, 20-27 = Severe depression   Psychosocial Evaluation and Intervention:  Psychosocial Evaluation - 08/01/20 1520      Psychosocial Evaluation & Interventions   Interventions Stress management education;Relaxation education;Encouraged to exercise with the program and follow exercise prescription    Comments Adorabella is returning to the program after completing it in 2019. She has had a NSTEMI this time. She states that this last year has been filled with medical issues and she is finally starting to feel better. She just started back driving and is looking forward to picking up her grandkids from school. Her family is her support system and she cherishes them. She doesn't report any symptoms of depression today and states she is only focused on seeing the positive side of things. She is excited to come back to the program and see some familiar faces. She is really wanting to maintain her independence and she knows this is the best way to do that    Expected Outcomes Short: attend cardiac rehab for education and exercise. Long: Develop positive self care habits.    Continue Psychosocial Services  Follow up required by staff           Psychosocial Re-Evaluation:   Psychosocial Discharge (Final  Psychosocial Re-Evaluation):   Vocational Rehabilitation: Provide vocational rehab assistance to qualifying candidates.   Vocational Rehab Evaluation & Intervention:  Vocational Rehab - 08/01/20 1520      Initial Vocational Rehab Evaluation & Intervention   Assessment shows need for Vocational Rehabilitation No           Education: Education Goals: Education classes will be provided on a variety of topics geared toward better understanding of heart health and risk factor modification. Participant will state understanding/return demonstration of topics presented as noted by education test scores.  Learning Barriers/Preferences:  Learning Barriers/Preferences - 08/01/20 1519      Learning Barriers/Preferences   Learning Barriers None    Learning Preferences None           General Cardiac Education Topics:  AED/CPR: - Group verbal and written instruction with the use of models to demonstrate the basic use of the AED with the basic ABC's of resuscitation. Flowsheet Row Cardiac Rehab from 02/08/2018 in [facility] Cardiac and Pulmonary Rehab  Date 01/26/18  Educator MA  Instruction Review Code 1- Verbalizes Understanding      Anatomy and Cardiac Procedures: - Group verbal and visual presentation and models provide  information about basic cardiac anatomy and function. Reviews the testing methods done to diagnose heart disease and the outcomes of the test results. Describes the treatment choices: Medical Management, Angioplasty, or Coronary Bypass Surgery for treating various heart conditions including Myocardial Infarction, Angina, Valve Disease, and Cardiac Arrhythmias.  Written material given at graduation. Flowsheet Row Cardiac Rehab from 08/05/2020 in [facility] Cardiac and Pulmonary Rehab  Education need identified 08/05/20      Medication Safety: - Group verbal and visual instruction to review commonly prescribed medications for heart and lung disease. Reviews the medication, class  of the drug, and side effects. Includes the steps to properly store meds and maintain the prescription regimen.  Written material given at graduation. Flowsheet Row Cardiac Rehab from 02/08/2018 in [facility] Cardiac and Pulmonary Rehab  Date 01/17/18  Educator SB  Instruction Review Code 1- Verbalizes Understanding      Intimacy: - Group verbal instruction through game format to discuss how heart and lung disease can affect sexual intimacy. Written material given at graduation.. Flowsheet Row Cardiac Rehab from 02/08/2018 in [facility] Cardiac and Pulmonary Rehab  Date 12/20/17  Educator SB  Instruction Review Code 1- Verbalizes Understanding      Know Your Numbers and Heart Failure: - Group verbal and visual instruction to discuss disease risk factors for cardiac and pulmonary disease and treatment options.  Reviews associated critical values for Overweight/Obesity, Hypertension, Cholesterol, and Diabetes.  Discusses basics of heart failure: signs/symptoms and treatments.  Introduces Heart Failure Zone chart for action plan for heart failure.  Written material given at graduation. Flowsheet Row Cardiac Rehab from 02/08/2018 in [facility] Cardiac and Pulmonary Rehab  Date 01/12/18  Educator CE  Instruction Review Code 1- Verbalizes Understanding      Infection Prevention: - Provides verbal and written material to individual with discussion of infection control including proper hand washing and proper equipment cleaning during exercise session. Flowsheet Row Cardiac Rehab from 08/05/2020 in [facility] Cardiac and Pulmonary Rehab  Date 08/05/20  Educator [facility]  Instruction Review Code 1- Verbalizes Understanding      Falls Prevention: - Provides verbal and written material to individual with discussion of falls prevention and safety. Flowsheet Row Cardiac Rehab from 08/05/2020 in [facility] Cardiac and Pulmonary Rehab  Date 08/05/20  Educator [facility]  Instruction Review Code 1- Verbalizes Understanding       Other: -Provides group and verbal instruction on various topics (see comments)   Knowledge Questionnaire Score:  Knowledge Questionnaire Score - 08/05/20 0910      Knowledge Questionnaire Score   Pre Score 22/26 Education Focus: Angina, Nutrition, Exercise           Core Components/Risk Factors/Patient Goals at Admission:  Personal Goals and Risk Factors at Admission - 08/05/20 0910      Core Components/Risk Factors/Patient Goals on Admission    Weight Management Yes;Obesity;Weight Loss    Intervention Weight Management: Develop a combined nutrition and exercise program designed to reach desired caloric intake, while maintaining appropriate intake of nutrient and fiber, sodium and fats, and appropriate energy expenditure required for the weight goal.;Weight Management: Provide education and appropriate resources to help participant work on and attain dietary goals.;Weight Management/Obesity: Establish reasonable short term and long term weight goals.;Obesity: Provide education and appropriate resources to help participant work on and attain dietary goals.    Admit Weight 166 lb (75.3 kg)    Goal Weight: Short Term 160 lb (72.6 kg)    Goal Weight: Long Term 155 lb (70.3 kg)  Expected Outcomes Short Term: Continue to assess and modify interventions until short term weight is achieved;Long Term: Adherence to nutrition and physical activity/exercise program aimed toward attainment of established weight goal;Weight Loss: Understanding of general recommendations for a balanced deficit meal plan, which promotes 1-2 lb weight loss per week and includes a negative energy balance of [phone] kcal/d;Understanding recommendations for meals to include 15-35% energy as protein, 25-35% energy from fat, 35-60% energy from carbohydrates, less than 200mg  of dietary cholesterol, 20-35 gm of total fiber daily;Understanding of distribution of calorie intake throughout the day with the consumption of 4-5  meals/snacks    Diabetes Yes    Intervention Provide education about signs/symptoms and action to take for hypo/hyperglycemia.;Provide education about proper nutrition, including hydration, and aerobic/resistive exercise prescription along with prescribed medications to achieve blood glucose in normal ranges: Fasting glucose 65-99 mg/dL    Expected Outcomes Short Term: Participant verbalizes understanding of the signs/symptoms and immediate care of hyper/hypoglycemia, proper foot care and importance of medication, aerobic/resistive exercise and nutrition plan for blood glucose control.;Long Term: Attainment of HbA1C < 7%.    Heart Failure Yes    Intervention Provide a combined exercise and nutrition program that is supplemented with education, support and counseling about heart failure. Directed toward relieving symptoms such as shortness of breath, decreased exercise tolerance, and extremity edema.    Expected Outcomes Improve functional capacity of life;Short term: Attendance in program 2-3 days a week with increased exercise capacity. Reported lower sodium intake. Reported increased fruit and vegetable intake. Reports medication compliance.;Short term: Daily weights obtained and reported for increase. Utilizing diuretic protocols set by physician.;Long term: Adoption of self-care skills and reduction of barriers for early signs and symptoms recognition and intervention leading to self-care maintenance.    Hypertension Yes    Intervention Provide education on lifestyle modifcations including regular physical activity/exercise, weight management, moderate sodium restriction and increased consumption of fresh fruit, vegetables, and low fat dairy, alcohol moderation, and smoking cessation.;Monitor prescription use compliance.    Expected Outcomes Short Term: Continued assessment and intervention until BP is < 140/52mm HG in hypertensive participants. < 130/22mm HG in hypertensive participants with diabetes,  heart failure or chronic kidney disease.;Long Term: Maintenance of blood pressure at goal levels.    Lipids Yes    Intervention Provide education and support for participant on nutrition & aerobic/resistive exercise along with prescribed medications to achieve LDL 70mg , HDL >40mg .    Expected Outcomes Short Term: Participant states understanding of desired cholesterol values and is compliant with medications prescribed. Participant is following exercise prescription and nutrition guidelines.;Long Term: Cholesterol controlled with medications as prescribed, with individualized exercise RX and with personalized nutrition plan. Value goals: LDL < 70mg , HDL > 40 mg.           Education:Diabetes - Individual verbal and written instruction to review signs/symptoms of diabetes, desired ranges of glucose level fasting, after meals and with exercise. Acknowledge that pre and post exercise glucose checks will be done for 3 sessions at entry of program. [place] from 08/05/2020 in [facility] Cardiac and Pulmonary Rehab  Date 08/01/20  Educator [facility]  Instruction Review Code 1- Verbalizes Understanding      Core Components/Risk Factors/Patient Goals Review:    Core Components/Risk Factors/Patient Goals at Discharge (Final Review):    ITP Comments:  ITP Comments    Row Name 08/01/20 1507 08/05/20 0903 08/13/20 0759       ITP Comments Initial telephone orientation completed. Diagnosis can be found  in [facility] 4/13. EP orientation scheduled for Tuesday 5/10 at 8am. Completed 6MWT and gym orientation. Initial ITP created and sent for review to Dr. Emily Filbert, Medical Director. 30 Day review completed. Medical Director ITP review done, changes made as directed, and signed approval by Medical Director.   New to program            Comments:

## 2020-08-13 NOTE — Progress Notes (Signed)
Reach out to [facility] to request profile transfer of Sertaline, Rosuvastatin,ezetimbie,Clopidogrel,and Metoprolol to go to [facility] on 08/13/2020.  Completed acute form for metoprolol to be deliver on 09/03/2020.Sent to Clinical Pharmacist for review.  [place] Pharmacist Assistant [phone]

## 2020-08-18 ENCOUNTER — Encounter: Payer: Medicare Other | Admitting: *Deleted

## 2020-08-18 ENCOUNTER — Other Ambulatory Visit: Payer: Self-pay

## 2020-08-18 DIAGNOSIS — I214 Non-ST elevation (NSTEMI) myocardial infarction: Secondary | ICD-10-CM

## 2020-08-18 LAB — GLUCOSE, CAPILLARY
Glucose-Capillary: 277 mg/dL — ABNORMAL HIGH (ref 70–99)
Glucose-Capillary: 255 mg/dL — ABNORMAL HIGH (ref 70–99)

## 2020-08-18 NOTE — Progress Notes (Signed)
Daily Session Note  Patient Details  Name: Candace [name] MRN: [id number] Date of Birth: 11/03/1950 Referring Provider:   Flowsheet Row Cardiac Rehab from 08/05/2020 in [facility] Cardiac and Pulmonary Rehab  Referring Provider Kowalski, Bruce MD      Encounter Date: 08/18/2020  Check In:  Session Check In - 08/18/20 0856      Check-In   Supervising physician immediately available to respond to emergencies See telemetry face sheet for immediately available ER MD    Location [facility]-Cardiac & Pulmonary Rehab    Staff Present  , RN, BSN, CCRP;Joseph Hood RCP,RRT,BSRT;Kelly Hayes, BS, ACSM CEP, Exercise Physiologist    Virtual Visit No    Medication changes reported     No    Fall or balance concerns reported    No    Warm-up and Cool-down Performed on first and last piece of equipment    Resistance Training Performed Yes    VAD Patient? No    PAD/SET Patient? No      Pain Assessment   Currently in Pain? No/denies              Social History   Tobacco Use  Smoking Status Former Smoker  . Packs/day: 0.50  . Types: Cigarettes  . Quit date: 10/07/2001  . Years since quitting: 18.8  Smokeless Tobacco Never Used    Goals Met:  Independence with exercise equipment Exercise tolerated well Personal goals reviewed No report of cardiac concerns or symptoms  Goals Unmet:  Not Applicable  Comments: First full day of exercise!  Patient was oriented to gym and equipment including functions, settings, policies, and procedures.  Patient's individual exercise prescription and treatment plan were reviewed.  All starting workloads were established based on the results of the 6 minute walk test done at initial orientation visit.  The plan for exercise progression was also introduced and progression will be customized based on patient's performance and goals.  [name] has attended the program in the past    Dr. Mark Miller is Medical Director for [facility].   Dr. Fuad Aleskerov is Medical Director for [facility]. 

## 2020-08-19 ENCOUNTER — Ambulatory Visit: Payer: Medicaid Other

## 2020-08-19 DIAGNOSIS — I5032 Chronic diastolic (congestive) heart failure: Secondary | ICD-10-CM

## 2020-08-19 DIAGNOSIS — E785 Hyperlipidemia, unspecified: Secondary | ICD-10-CM | POA: Diagnosis not present

## 2020-08-19 DIAGNOSIS — E118 Type 2 diabetes mellitus with unspecified complications: Secondary | ICD-10-CM

## 2020-08-19 DIAGNOSIS — E1159 Type 2 diabetes mellitus with other circulatory complications: Secondary | ICD-10-CM

## 2020-08-19 DIAGNOSIS — I152 Hypertension secondary to endocrine disorders: Secondary | ICD-10-CM

## 2020-08-19 DIAGNOSIS — E1165 Type 2 diabetes mellitus with hyperglycemia: Secondary | ICD-10-CM | POA: Diagnosis not present

## 2020-08-19 DIAGNOSIS — I480 Paroxysmal atrial fibrillation: Secondary | ICD-10-CM | POA: Diagnosis not present

## 2020-08-19 NOTE — Chronic Care Management (AMB) (Signed)
Chronic Care Management   Follow Up Note   08/19/2020 Name: Candace [name] MRN: [id number] DOB: July 04, 1950  Primary Care Provider: [name]., MD Reason for referral : Chronic Care Management   Candace [name] is a 70 y.o. year old female who is a primary care patient of [name]., MD. She is currently enrolled in the Chronic Care Management program. A routine telephonic outreach was conducted today.  Review of Candace [name]'s status, including review of consultants reports, relevant labs and test results was conducted today. Collaboration with appropriate care team members was performed as part of the comprehensive evaluation and provision of chronic care management services.    SDOH (Social Determinants of Health) assessments performed: No     Outpatient Encounter Medications as of 08/19/2020  Medication Sig Note  . acetaminophen (TYLENOL) 325 MG tablet Take 650 mg by mouth every 6 (six) hours as needed for moderate pain or headache.    . bismuth subsalicylate (PEPTO BISMOL) 262 MG chewable tablet Chew 524 mg by mouth as needed.   . clopidogrel (PLAVIX) 75 MG tablet Take 75 mg by mouth daily.   . Dulaglutide (TRULICITY) 1.5 OY/7.7AJ SOPN Inject 1.5 mg into the skin once a week.   Marland Kitchen ELIQUIS 5 MG TABS tablet Take 5 mg by mouth 2 (two) times daily. 06/06/2020: Prescribed by Dr. Nehemiah Massed  . ezetimibe (ZETIA) 10 MG tablet Take 10 mg by mouth daily.   . isosorbide mononitrate (IMDUR) 30 MG 24 hr tablet Take 30 mg by mouth daily.   Marland Kitchen JARDIANCE 25 MG TABS tablet TAKE ONE TABLET BY MOUTH BEFORE BREAKFAST   . Lancets (ONETOUCH DELICA PLUS OINOMV67M) MISC USE UP TO 4 TIMES DAILY AS DIRECTED   . loperamide (IMODIUM) 2 MG capsule Take 2 mg by mouth as needed.   Marland Kitchen losartan (COZAAR) 25 MG tablet Take 1 tablet (25 mg total) by mouth daily.   . metoprolol tartrate (LOPRESSOR) 100 MG tablet Take 100 mg by mouth 2 (two) times daily. 06/06/2020: Prescribed by Dr. Nehemiah Massed   .  nitroGLYCERIN (NITROSTAT) 0.4 MG SL tablet Place 1 tablet (0.4 mg total) under the tongue every 5 (five) minutes as needed for chest pain.   Marland Kitchen nystatin (NYSTATIN) powder Apply 1 application topically daily. (Patient taking differently: Apply 1 application topically daily as needed.)   . ondansetron (ZOFRAN-ODT) 8 MG disintegrating tablet Take 1 tablet (8 mg total) by mouth every 8 (eight) hours as needed.   . pantoprazole (PROTONIX) 20 MG tablet Take 1 tablet (20 mg total) by mouth 2 (two) times daily.   . rosuvastatin (CRESTOR) 40 MG tablet Take 40 mg by mouth daily.   . sertraline (ZOLOFT) 50 MG tablet Take 1 tablet (50 mg total) by mouth daily.   . sucralfate (CARAFATE) 1 g tablet Take one tablet in the morning and one tablet in the evening. (Patient not taking: No sig reported)   . [DISCONTINUED] cetirizine (ZYRTEC) 5 MG tablet Take 2 tablets (10 mg total) by mouth daily.    No facility-administered encounter medications on file as of 08/19/2020.       Objective:  Patient Care Plan: Diabetes Type 2 (Adult)  Problem Identified: Glycemic Management (Diabetes, Type 2)   Priority: High  Onset Date: 04/15/2020    Long-Range Goal: Glycemic Management Optimized   Start Date: 04/15/2020  Expected End Date: 08/13/2020  This Visit's Progress: On track  Priority: High  Note:    Lab Results  Component Value  Date   HGBA1C 9.1 (H) 08/11/2020    Current Barriers:  . Chronic Disease Management support and educational needs r/t Diabetes self management.   Case Manager Clinical Goal(s):  Marland Kitchen Over the next 120 days, patient will demonstrate improved adherence to prescribed treatment plan for Diabetes self management as evidenced by daily monitoring and recording of CBG, adherence to ADA/ carb modified diet and adherence to prescribed medication regimen.  Interventions:  . Collaboration with Miguel Aschoff, MD regarding development and update of comprehensive plan of care as evidenced by provider  attestation and co-signature . Inter-disciplinary care team collaboration (see longitudinal plan of care) . Reviewed recent blood glucose readings and compliance with current treatment plan. Reports significant improvements with fasting reading. Levels have ranged from the 120's to the 190's. Reports taking medications as prescribed and monitoring her blood glucose levels frequently as advised. Reviewed s/sx of hypoglycemia and hyperglycemia along with appropriate interventions. She is doing well with monitoring her intake and reading nutritional labels. She has made several improvements to her daily nutritional intake since the last outreach. Discussed need  to maintain and food diary to assist with monitoring intake. Her A1C is not at goal but has improved from 12.2 to 9.1%.   Patient Goals/Self-Care Activities -Self administer medications as prescribed -Attend all scheduled provider appointments -Monitor blood glucose levels consistently and utilize recommended interventions -Monitor closely for s/sx of hyperglycemia and seek medical attention if needed -Adhere to prescribed ADA/carb modified -Notify provider or care management team with questions and new concerns as needed.   Follow Up Plan:  Will follow up next month     Patient Care Plan: Heart Failure (Adult)    Problem Identified: Symptom Exacerbation (Heart Failure)   Priority: High  Onset Date: 04/15/2020    Long-Range Goal: Symptom Exacerbation Prevented or Minimized   Start Date: 04/15/2020  Expected End Date: 08/13/2020  This Visit's Progress: On track  Priority: High  Note:    Current Barriers:  . Chronic Disease Management support and educational needs r/t CHF.  Case Manager Clinical Goal(s):  Marland Kitchen Over the next 120 days, patient will not require hospitalization or emergent evaluation d/t complications r/t CHF exacerbation.  Interventions:  . Collaboration with [name]., MD regarding development and  update of comprehensive plan of care as evidenced by provider attestation and co-signature . Inter-disciplinary care team collaboration (see longitudinal plan of care) . Reviewed medications and current plan for CHF management. Reports taking medications as prescribed. Reviewed established weight parameters and indications for notifying a provider. Reports weights have been within range. Denies abdominal or lower extremity edema. Denies episodes of chest pain, palpitations or shortness of breath. Denies changes or decline in activity tolerance. Encouraged to continue monitoring and recording weights. . Reviewed plan for Cardiac Rehab. Started sessions on 08/18/20. Current plan is to attend rehab 3 times a week if tolerated with a goal to maintain a heart rate between 111 to 138 bpm. Reports session was interrupted on yesterday d/t elevated heart rate increasing to the 140's. She remains very motivated to improve her activity tolerance and overall health.  . Discussed nutritional intake and recommended sodium restriction. Reports improvements with dietary intake. Thoroughly discussed heart healthy/low sodium food options and importance of maintaining a food diary. Encouraged to continue monitoring nutritional labels and avoiding highly processed foods when possible. . Reviewed worsening symptoms r/t CHF exacerbation and indications for seeking immediate medical attention.     Patient Goals/Self-Care Activities:  -Take medications as  prescribed -Follow plan for CHF symptom management and notify provider with concerns -Monitor and record weights -Adhere to recommended cardiac diet -Notify provider or care management team with questions or new concerns as needed  Follow Up Plan:  Will follow up next month       Patient Care Plan: Hypertension (Adult)    Problem Identified: Hypertension (Hypertension)     Long-Range Goal: Hypertension Monitored Completed 08/19/2020  Start Date: 04/15/2020   Expected End Date: 08/13/2020  Priority: High  Note:     Current Barriers:  . Chronic Disease Management support and educational needs r/t Hypertension.  Case Manager Clinical Goal(s):  Marland Kitchen Over the next 120 days, patient will demonstrate improved adherence to prescribed treatment plan for hypertension as evidenced by taking all medications as prescribed, monitoring and recording blood pressure and adhering to a cardiac prudent/heart healthy diet.   Interventions:  . Collaboration with [name]., MD regarding development and update of comprehensive plan of care as evidenced by provider attestation and co-signature . Inter-disciplinary care team collaboration (see longitudinal plan of care) . Reviewed current treatment plan. Continues to take medications as prescribed. Reviewed established BP ranges and indications for notifying a provider. Reports readings have been within range. Reports significant improvements with maintaining a heart healthy/cardiac prudent diet. Encouraged to continue monitoring and recording BP.  Marland Kitchen Reviewed s/sx of heart attack, stroke and worsening symptoms that require immediate medical attention.   Patient Goals/Self-Care Activities: -Self administer medications as prescribed -Attend all scheduled provider appointments -Monitor and record blood pressure -Adhere to recommended cardiac prudent/heart healthy diet -Notify provider or care management team with questions and new concerns as needed   Goal Met       Patient Care Plan: Covid Exposure    Problem Identified: Covid Exposure     Goal: Symptom Exacerbation Prevented or Minimized Completed 08/19/2020  Start Date: 07/23/2020  Expected End Date: 08/22/2020  Priority: High  Note:    Current Barriers:  . Disease Management needs related to COVID-19 symptom management.   Clinical Goal(s):  Marland Kitchen Over the next 30 days, patient will not require hospitalization or emergent care d/t complications  r/t [id number] exposure.   Interventions:  . Collaboration with [name]., MD regarding development and update of comprehensive plan of care as evidenced by provider attestation and co-signature . Inter-disciplinary care team collaboration (see longitudinal plan of care) . Provided education to enhance basic understanding of COVID-19. Marland Kitchen Discussed current condition r/t previous COVID-19 exposure.  Reports following recommended safety precautions. Discussed increased risk for complications d/t multiple chronic illnesses. Reviewed s/sx of respiratory complications and indications for seeking immediate medical attention. Denies new symptoms or concerns r/t suspected exposure.   Patient Goals/Self-Care Activities -Self-administer medications as prescribed -Follow recommended precautions to prevent COVID-19 exposure. -Call provider for new concerns or questions as needed    Goal Met      PLAN A member of the care management team will follow up with Ms. Detjen next month.     [name] Care Management [facility] [phone]

## 2020-08-20 ENCOUNTER — Other Ambulatory Visit: Payer: Self-pay

## 2020-08-20 DIAGNOSIS — I214 Non-ST elevation (NSTEMI) myocardial infarction: Secondary | ICD-10-CM

## 2020-08-20 LAB — GLUCOSE, CAPILLARY
Glucose-Capillary: 250 mg/dL — ABNORMAL HIGH (ref 70–99)
Glucose-Capillary: 248 mg/dL — ABNORMAL HIGH (ref 70–99)

## 2020-08-20 NOTE — Progress Notes (Signed)
Daily Session Note  Patient Details  Name: KARISHMA UNREIN MRN: [id number] Date of Birth: 03-23-1951 Referring Provider:   Flowsheet Row Cardiac Rehab from 08/05/2020 in [facility] Cardiac and Pulmonary Rehab  Referring Provider [name]      Encounter Date: 08/20/2020  Check In:  Session Check In - 08/20/20 0753      Check-In   Supervising physician immediately available to respond to emergencies See telemetry face sheet for immediately available ER MD    Location [facility]-Cardiac & Pulmonary Rehab    Staff Present Birdie Sons, MPA, Elveria Rising, BA, ACSM CEP, Exercise Physiologist;Joseph Tessie Fass RCP,RRT,BSRT    Virtual Visit No    Medication changes reported     No    Fall or balance concerns reported    No    Warm-up and Cool-down Performed on first and last piece of equipment    Resistance Training Performed Yes    VAD Patient? No    PAD/SET Patient? No      Pain Assessment   Currently in Pain? No/denies              Social History   Tobacco Use  Smoking Status Former Smoker  . Packs/day: 0.50  . Types: Cigarettes  . Quit date: 10/07/2001  . Years since quitting: 18.8  Smokeless Tobacco Never Used    Goals Met:  Independence with exercise equipment Exercise tolerated well No report of cardiac concerns or symptoms Strength training completed today  Goals Unmet:  Not Applicable  Comments: Pt able to follow exercise prescription today without complaint.  Will continue to monitor for progression.    Dr. Emily Filbert is Medical Director for [facility].  Dr. Ottie Glazier is Medical Director for [facility].

## 2020-08-20 NOTE — Progress Notes (Signed)
Completed initial RD evaluation 

## 2020-08-22 ENCOUNTER — Other Ambulatory Visit: Payer: Self-pay

## 2020-08-22 ENCOUNTER — Encounter: Payer: Medicare Other | Admitting: *Deleted

## 2020-08-22 DIAGNOSIS — I214 Non-ST elevation (NSTEMI) myocardial infarction: Secondary | ICD-10-CM | POA: Diagnosis not present

## 2020-08-22 LAB — GLUCOSE, CAPILLARY
Glucose-Capillary: 213 mg/dL — ABNORMAL HIGH (ref 70–99)
Glucose-Capillary: 260 mg/dL — ABNORMAL HIGH (ref 70–99)

## 2020-08-22 NOTE — Progress Notes (Signed)
Daily Session Note  Patient Details  Name: Candace [name] MRN: [id number] Date of Birth: 02/05/1951 Referring Provider:   Flowsheet Row Cardiac Rehab from 08/05/2020 in [facility] Cardiac and Pulmonary Rehab  Referring Provider [name]      Encounter Date: 08/22/2020  Check In:  Session Check In - 08/22/20 0838      Check-In   Supervising physician immediately available to respond to emergencies See telemetry face sheet for immediately available ER MD    Location [facility]-Cardiac & Pulmonary Rehab    Staff Present Heath Lark, RN, BSN, CCRP;Jessica Willow, MA, RCEP, CCRP, Marylynn Pearson, MS, ASCM CEP, Exercise Physiologist    Virtual Visit No    Medication changes reported     No    Fall or balance concerns reported    No    Warm-up and Cool-down Performed on first and last piece of equipment    Resistance Training Performed Yes    VAD Patient? No    PAD/SET Patient? No      Pain Assessment   Currently in Pain? No/denies              Social History   Tobacco Use  Smoking Status Former Smoker  . Packs/day: 0.50  . Types: Cigarettes  . Quit date: 10/07/2001  . Years since quitting: 18.8  Smokeless Tobacco Never Used    Goals Met:  Independence with exercise equipment Exercise tolerated well No report of cardiac concerns or symptoms  Goals Unmet:  Not Applicable  Comments: Pt able to follow exercise prescription today without complaint.  Will continue to monitor for progression.    Dr. Emily Filbert is Medical Director for [place] [facility].  Dr. Ottie Glazier is Medical Director for [facility].

## 2020-08-26 NOTE — Patient Instructions (Signed)
Thank you for allowing the Chronic Care Management team to participate in your care. It was a pleasure speaking with you today. Please feel free to contact me with questions.    Goals Addressed: Patient Care Plan: Diabetes Type 2 (Adult)  Problem Identified: Glycemic Management (Diabetes, Type 2)   Priority: High  Onset Date: 04/15/2020    Long-Range Goal: Glycemic Management Optimized   Start Date: 04/15/2020  Expected End Date: 08/13/2020  This Visit's Progress: On track  Priority: High  Note:    Lab Results  Component Value Date   HGBA1C 9.1 (H) 08/11/2020    Current Barriers:  . Chronic Disease Management support and educational needs r/t Diabetes self management.   Case Manager Clinical Goal(s):  Marland Kitchen Over the next 120 days, patient will demonstrate improved adherence to prescribed treatment plan for Diabetes self management as evidenced by daily monitoring and recording of CBG, adherence to ADA/ carb modified diet and adherence to prescribed medication regimen.  Interventions:  . Collaboration with Miguel Aschoff, MD regarding development and update of comprehensive plan of care as evidenced by provider attestation and co-signature . Inter-disciplinary care team collaboration (see longitudinal plan of care) . Reviewed recent blood glucose readings and compliance with current treatment plan. Reports significant improvements with fasting reading. Levels have ranged from the 120's to the 190's. Reports taking medications as prescribed and monitoring her blood glucose levels frequently as advised. Reviewed s/sx of hypoglycemia and hyperglycemia along with appropriate interventions. She is doing well with monitoring her intake and reading nutritional labels. She has made several improvements to her daily nutritional intake since the last outreach. Discussed need  to maintain and food diary to assist with monitoring intake. Her A1C is not at goal but has improved from 12.2 to  9.1%.   Patient Goals/Self-Care Activities -Self administer medications as prescribed -Attend all scheduled provider appointments -Monitor blood glucose levels consistently and utilize recommended interventions -Monitor closely for s/sx of hyperglycemia and seek medical attention if needed -Adhere to prescribed ADA/carb modified -Notify provider or care management team with questions and new concerns as needed.   Follow Up Plan:  Will follow up next month     Patient Care Plan: Heart Failure (Adult)    Problem Identified: Symptom Exacerbation (Heart Failure)   Priority: High  Onset Date: 04/15/2020    Long-Range Goal: Symptom Exacerbation Prevented or Minimized   Start Date: 04/15/2020  Expected End Date: 08/13/2020  This Visit's Progress: On track  Priority: High  Note:    Current Barriers:  . Chronic Disease Management support and educational needs r/t CHF.  Case Manager Clinical Goal(s):  Marland Kitchen Over the next 120 days, patient will not require hospitalization or emergent evaluation d/t complications r/t CHF exacerbation.  Interventions:  . Collaboration with [name]., MD regarding development and update of comprehensive plan of care as evidenced by provider attestation and co-signature . Inter-disciplinary care team collaboration (see longitudinal plan of care) . Reviewed medications and current plan for CHF management. Reports taking medications as prescribed. Reviewed established weight parameters and indications for notifying a provider. Reports weights have been within range. Denies abdominal or lower extremity edema. Denies episodes of chest pain, palpitations or shortness of breath. Denies changes or decline in activity tolerance. Encouraged to continue monitoring and recording weights. . Reviewed plan for Cardiac Rehab. Started sessions on 08/18/20. Current plan is to attend rehab 3 times a week if tolerated with a goal to maintain a heart rate between 111 to  138 bpm. Reports session was interrupted on yesterday d/t elevated heart rate increasing to the 140's. She remains very motivated to improve her activity tolerance and overall health.  . Discussed nutritional intake and recommended sodium restriction. Reports improvements with dietary intake. Thoroughly discussed heart healthy/low sodium food options and importance of maintaining a food diary. Encouraged to continue monitoring nutritional labels and avoiding highly processed foods when possible. . Reviewed worsening symptoms r/t CHF exacerbation and indications for seeking immediate medical attention.     Patient Goals/Self-Care Activities:  -Take medications as prescribed -Follow plan for CHF symptom management and notify provider with concerns -Monitor and record weights -Adhere to recommended cardiac diet -Notify provider or care management team with questions or new concerns as needed  Follow Up Plan:  Will follow up next month       Patient Care Plan: Hypertension (Adult)    Problem Identified: Hypertension (Hypertension)     Long-Range Goal: Hypertension Monitored Completed 08/19/2020  Start Date: 04/15/2020  Expected End Date: 08/13/2020  Priority: High  Note:     Current Barriers:  . Chronic Disease Management support and educational needs r/t Hypertension.  Case Manager Clinical Goal(s):  Marland Kitchen Over the next 120 days, patient will demonstrate improved adherence to prescribed treatment plan for hypertension as evidenced by taking all medications as prescribed, monitoring and recording blood pressure and adhering to a cardiac prudent/heart healthy diet.   Interventions:  . Collaboration with [name]., MD regarding development and update of comprehensive plan of care as evidenced by provider attestation and co-signature . Inter-disciplinary care team collaboration (see longitudinal plan of care) . Reviewed current treatment plan. Continues to take medications as  prescribed. Reviewed established BP ranges and indications for notifying a provider. Reports readings have been within range. Reports significant improvements with maintaining a heart healthy/cardiac prudent diet. Encouraged to continue monitoring and recording BP.  Marland Kitchen Reviewed s/sx of heart attack, stroke and worsening symptoms that require immediate medical attention.   Patient Goals/Self-Care Activities: -Self administer medications as prescribed -Attend all scheduled provider appointments -Monitor and record blood pressure -Adhere to recommended cardiac prudent/heart healthy diet -Notify provider or care management team with questions and new concerns as needed   Goal Met       Patient Care Plan: Covid Exposure    Problem Identified: Covid Exposure     Goal: Symptom Exacerbation Prevented or Minimized Completed 08/19/2020  Start Date: 07/23/2020  Expected End Date: 08/22/2020  Priority: High  Note:    Current Barriers:  . Disease Management needs related to COVID-19 symptom management.   Clinical Goal(s):  Marland Kitchen Over the next 30 days, patient will not require hospitalization or emergent care d/t complications r/t RDEYC-14 exposure.   Interventions:  . Collaboration with [name]., MD regarding development and update of comprehensive plan of care as evidenced by provider attestation and co-signature . Inter-disciplinary care team collaboration (see longitudinal plan of care) . Provided education to enhance basic understanding of COVID-19. Marland Kitchen Discussed current condition r/t previous COVID-19 exposure.  Reports following recommended safety precautions. Discussed increased risk for complications d/t multiple chronic illnesses. Reviewed s/sx of respiratory complications and indications for seeking immediate medical attention. Denies new symptoms or concerns r/t suspected exposure.   Patient Goals/Self-Care Activities -Self-administer medications as prescribed -Follow  recommended precautions to prevent COVID-19 exposure. -Call provider for new concerns or questions as needed    Goal Met       Ms. Litchford verbalized understanding of the  information discussed during the telephonic outreach today. Declined need for mailed/printed instructions. A member of the care management team will follow up next month.     [name] [phone]

## 2020-08-27 ENCOUNTER — Encounter: Payer: Medicare Other | Attending: Internal Medicine | Admitting: *Deleted

## 2020-08-27 ENCOUNTER — Other Ambulatory Visit: Payer: Self-pay

## 2020-08-27 DIAGNOSIS — I214 Non-ST elevation (NSTEMI) myocardial infarction: Secondary | ICD-10-CM | POA: Insufficient documentation

## 2020-08-27 NOTE — Progress Notes (Signed)
Daily Session Note  Patient Details  Name: Candace [name] MRN: [id number] Date of Birth: 03-18-1951 Referring Provider:   Flowsheet Row Cardiac Rehab from 08/05/2020 in [facility] Cardiac and Pulmonary [facility]  Referring Provider [name]      Encounter Date: 08/27/2020  Check In:  Session Check In - 08/27/20 0843      Check-In   Supervising physician immediately available to respond to emergencies See telemetry face sheet for immediately available ER MD    Location [facility]-Cardiac & Pulmonary Rehab    Staff Present Heath Lark, RN, BSN, CCRP;Amanda Sommer, BA, ACSM CEP, Exercise Physiologist;Melissa Caiola RDN, LDN    Virtual Visit No    Medication changes reported     No    Fall or balance concerns reported    No    Warm-up and Cool-down Performed on first and last piece of equipment    Resistance Training Performed Yes    VAD Patient? No    PAD/SET Patient? No      Pain Assessment   Currently in Pain? No/denies              Social History   Tobacco Use  Smoking Status Former Smoker  . Packs/day: 0.50  . Types: Cigarettes  . Quit date: 10/07/2001  . Years since quitting: 18.9  Smokeless Tobacco Never Used    Goals Met:  Independence with exercise equipment Exercise tolerated well No report of cardiac concerns or symptoms  Goals Unmet:  Not Applicable  Comments: Pt able to follow exercise prescription today without complaint.  Will continue to monitor for progression.    Dr. Emily Filbert is Medical Director for [place].  Dr. Ottie Glazier is Medical Director for [facility] Pulmonary Rehabilitation.

## 2020-08-28 ENCOUNTER — Telehealth: Payer: Self-pay

## 2020-08-28 NOTE — Telephone Encounter (Signed)
This is a known side effect of Trulicity.  But often improves over a few weeks after increasing the dose.  She can wait and see if it improves versus going back down to a lower dose.  She will need to make sure to call back in a few weeks to let Dr. Darnell Level know what she does and see if he wants to add another medication instead of the higher dose Trulicity which she does decrease.

## 2020-08-28 NOTE — Telephone Encounter (Signed)
Please review for Dr. Gilbert  Thanks,   -Knight Oelkers  

## 2020-08-28 NOTE — Telephone Encounter (Signed)
Copied from [place] [id number]. Topic: General - Other >> Aug 28, 2020  7:13 AM Leward Quan A wrote: Reason for CRM: Patient called in to inquire of Dr Rosanna Randy about the diarrhea the day after she takes her Dulaglutide (TRULICITY) 1.5 DJ/4.9FW SOPN. Per patient she have had more diarrhea now with the increased dose would like to know what to do to stop her from having this effect or does she need to be prescribed something else. Please call patient at Ph# [phone]

## 2020-08-28 NOTE — Telephone Encounter (Signed)
LMTCB 08/28/2020.   PEC please advise pt below when she calls back.   Thanks,   -Mickel Baas

## 2020-08-28 NOTE — Telephone Encounter (Signed)
Patient returned call, given message below from Dr. Brita Romp noted on 08/28/20, patient verbalized understanding. She says does she want me to have diarrhea? She says after she took the increased dose on Tuesday at 0800, the diarrhea started on Wednesday at 2200. She says she went 6-7 times in 8 hours. She says she ended up taking Imodium 2 capsules at 0200 and again at 0400, with the diarrhea stopping around 0700 this morning. She says she has not had anymore diarrhea during the day today so far. I advised I will send this to Dr. Brita Romp and someone will call if she has additional recommendations, advised to stay hydrated, she says she is staying hydrated and is eating normal.

## 2020-08-29 NOTE — Telephone Encounter (Signed)
Left detailed voicemail for patient.

## 2020-08-29 NOTE — Telephone Encounter (Signed)
No I don't. I gave option of decreasing back to lower dose of Trulicity at which she was not having diarrhea.

## 2020-09-02 ENCOUNTER — Telehealth: Payer: Self-pay

## 2020-09-02 NOTE — Progress Notes (Signed)
Chronic Care Management Pharmacy Assistant   Name: ANGLINE SCHWEIGERT  MRN: [id number] DOB: [birth year]  Reason for Encounter:Diabetes Disease State Call.   Recent office visits:  08/19/2020 Neldon Labella RN (CCM)  Recent consult visits:   08/27/2020 Heath Lark RN [facility] Cardiac and Pulmonary Rehab) 08/22/2020 Heath Lark RN [facility] Cardiac and Pulmonary Rehab) 08/20/2020 Renato Battles RD (Dietician) 08/20/2020 Birdie Sons RN [facility] Cardiac and Pulmonary Rehab) 08/18/2020 Heath Lark RN [facility] Cardiac and Pulmonary Rehab) 08/13/2020 Heath Lark RN [facility] Cardiac and Pulmonary Rehab)  Hospital visits:  None in previous 6 months  Medications: Outpatient Encounter Medications as of 09/02/2020  Medication Sig Note  . acetaminophen (TYLENOL) 325 MG tablet Take 650 mg by mouth every 6 (six) hours as needed for moderate pain or headache.    . bismuth subsalicylate (PEPTO BISMOL) 262 MG chewable tablet Chew 524 mg by mouth as needed.   . clopidogrel (PLAVIX) 75 MG tablet Take 75 mg by mouth daily.   . Dulaglutide (TRULICITY) 1.5 MC/9.4BS SOPN Inject 1.5 mg into the skin once a week.   Marland Kitchen ELIQUIS 5 MG TABS tablet Take 5 mg by mouth 2 (two) times daily. 06/06/2020: Prescribed by Dr. Nehemiah Massed  . ezetimibe (ZETIA) 10 MG tablet Take 10 mg by mouth daily.   . isosorbide mononitrate (IMDUR) 30 MG 24 hr tablet Take 30 mg by mouth daily.   Marland Kitchen JARDIANCE 25 MG TABS tablet TAKE ONE TABLET BY MOUTH BEFORE BREAKFAST   . Lancets (ONETOUCH DELICA PLUS [id number]) MISC USE UP TO 4 TIMES DAILY AS DIRECTED   . loperamide (IMODIUM) 2 MG capsule Take 2 mg by mouth as needed.   Marland Kitchen losartan (COZAAR) 25 MG tablet Take 1 tablet (25 mg total) by mouth daily.   . metoprolol tartrate (LOPRESSOR) 100 MG tablet Take 100 mg by mouth 2 (two) times daily. 06/06/2020: Prescribed by Dr. Nehemiah Massed   . nitroGLYCERIN (NITROSTAT) 0.4 MG SL tablet Place 1 tablet (0.4 mg total) under the tongue every 5 (five) minutes as needed for  chest pain.   Marland Kitchen nystatin (NYSTATIN) powder Apply 1 application topically daily. (Patient taking differently: Apply 1 application topically daily as needed.)   . ondansetron (ZOFRAN-ODT) 8 MG disintegrating tablet Take 1 tablet (8 mg total) by mouth every 8 (eight) hours as needed.   . pantoprazole (PROTONIX) 20 MG tablet Take 1 tablet (20 mg total) by mouth 2 (two) times daily.   . rosuvastatin (CRESTOR) 40 MG tablet Take 40 mg by mouth daily.   . sertraline (ZOLOFT) 50 MG tablet Take 1 tablet (50 mg total) by mouth daily.   . sucralfate (CARAFATE) 1 g tablet Take one tablet in the morning and one tablet in the evening. (Patient not taking: No sig reported)   . [DISCONTINUED] cetirizine (ZYRTEC) 5 MG tablet Take 2 tablets (10 mg total) by mouth daily.    No facility-administered encounter medications on file as of 09/02/2020.   Star Rating Drugs: Jardiance 25 mg last filled on 06/30/2020 for 80 Day supply and [facility]. Losartan 25 mg last filled on 08/15/2020 for 30 Day Supply at [facility]. Rosuvastatin 40 mg last filled on 07/13/2020 for 90 day supply at [facility]. Trulicity 1.5 mg last filled on 08/15/2020 for 30 day supply at [facility].  Recent Relevant Labs: Lab Results  Component Value Date/Time   HGBA1C 9.1 (H) 08/11/2020 09:32 AM   HGBA1C 12.2 (A) 05/01/2020 09:55 AM   HGBA1C 12.4 (A)  12/06/2019 11:35 AM   HGBA1C 9.8 (H) 08/30/2019 07:15 AM   HGBA1C 9.2 07/10/2019 12:00 AM   HGBA1C 8.3 (H) 07/14/2013 05:42 AM   MICROALBUR 50 08/11/2020 08:51 AM    Kidney Function Lab Results  Component Value Date/Time   CREATININE 0.64 08/11/2020 09:32 AM   CREATININE 0.64 06/17/2020 09:20 AM   CREATININE 1.39 (H) 03/24/2017 04:38 PM   CREATININE 0.78 04/29/2014 08:42 PM   CREATININE 0.79 02/03/2014 04:06 PM   GFRNONAA >60 06/17/2020 09:20 AM   GFRNONAA >60 04/29/2014 08:42 PM   GFRNONAA >60 09/16/2013 05:23 AM   GFRAA 108 02/06/2020 09:49  AM   GFRAA >60 04/29/2014 08:42 PM   GFRAA >60 09/16/2013 05:23 AM    . Current antihyperglycemic regimen:   Trulicity 1.5 mg weekly   Jardiance 25 mg daily  . What recent interventions/DTPs have been made to improve glycemic control:  o See Below . Have there been any recent hospitalizations or ED visits since last visit with CPP? No . Patient reports hypoglycemic symptoms, including Hungry and Nervous/irritable . Patient reports hyperglycemic symptoms, including fatigue and weakness . How often are you checking your blood sugar?  o Patient states she tried to check her blood sugar but was not able to get her blood on the test strip correctly.Patient reports she figure out how to use the test strips correctly and check her blood in the past two days. . What are your blood sugars ranging?  o Before meals:  - On 09/01/2020 it was 385. - On 09/02/2020 it was 231. . During the week, how often does your blood glucose drop below 70? Never . Are you checking your feet daily/regularly?  o Patient denies numbness,pain or tingling sensation in her feet.    Per Clinical Pharmacist: Trulicity increased to 1.5 mg weekly.  How is she tolerating the dose change? Is she experiencing significant nausea or vomitting?  Patient states she is taking Trulicity 1.5 mg weekly.Patient states she is "not doing as bad as I would thought", but report symptoms that consist of diarrhea for the first three days after taking Trulicity and nausea.Patient states she call her PCP to report diarrhea.Patient states her PCP office told her to reduce dose back down to what it was before.Patient states she has not change the dose and would like to speak with clinical pharmacist first.  How has her appetite been?   Patient states her appetite has not change that much.Patient reports the 1st couple of days after she takes her Trulicity she does not have appetite.Patient reports she has one meal with three snacks  daily.  Per clinical Pharmacist Call [facility] pharmacy to see if they have refills for Clopidogrel, Rosuvastatin and Ezetimibe. If so have them transferred to [facility]. If not, let me know. She had filled for 90ds on 4/17   Spoke with [facility] pharmacy to confirm if they had any refills left for Clopidogrel, Rosuvastatin and Ezetimibe. Per [facility] patient last fill for  Clopidogrel on 07/13/2020 for 90 day supply with no refills, last fill for Rosuvastatin on 07/13/2020 for 90 day supply with no refills, last fill for Ezetimibe on 07/13/2020 for 90 day supply with no refill.Notified clinical Pharmacist.   How much of her Imdur 60 mg tablets does she have left? will need a new Rx for 30 mg daily when she is close to fill date.  Patient states she has "quite a bit left on hand".Patient reports she has almost a month left of IMDUR  60 mg on hand.Patient states she takes half a pill daily since the medication change was to 30 mg.  I informed patient to reach out to me when she has one week left on hand so I can reach out to request a refill for IMDUR 30 mg.Patient Verbalized Understanding.  Adherence Review: Is the patient currently on a STATIN medication? Yes Is the patient currently on ACE/ARB medication? Yes Does the patient have >5 day gap between last estimated fill dates? No  Schedule patient a telephone appointment for a possible side effect on 09/05/2020 at 8:15 am with Clinical Pharmacist.Sent Message to scheduler.  [place] Pharmacist Assistant [phone]

## 2020-09-04 ENCOUNTER — Telehealth: Payer: Self-pay

## 2020-09-04 NOTE — Telephone Encounter (Signed)
Candace [name] says she will return Monday 6/13

## 2020-09-05 ENCOUNTER — Ambulatory Visit (INDEPENDENT_AMBULATORY_CARE_PROVIDER_SITE_OTHER): Payer: Medicare Other

## 2020-09-05 ENCOUNTER — Telehealth: Payer: Medicaid Other

## 2020-09-05 DIAGNOSIS — I214 Non-ST elevation (NSTEMI) myocardial infarction: Secondary | ICD-10-CM

## 2020-09-05 DIAGNOSIS — E1165 Type 2 diabetes mellitus with hyperglycemia: Secondary | ICD-10-CM

## 2020-09-05 DIAGNOSIS — I5032 Chronic diastolic (congestive) heart failure: Secondary | ICD-10-CM

## 2020-09-05 DIAGNOSIS — E785 Hyperlipidemia, unspecified: Secondary | ICD-10-CM

## 2020-09-05 DIAGNOSIS — E1169 Type 2 diabetes mellitus with other specified complication: Secondary | ICD-10-CM

## 2020-09-05 MED ORDER — ROSUVASTATIN CALCIUM 40 MG PO TABS: 40.0000 mg | ORAL_TABLET | Freq: Every day | ORAL | 0 refills | Status: DC

## 2020-09-05 MED ORDER — EZETIMIBE 10 MG PO TABS: 10.0000 mg | ORAL_TABLET | Freq: Every day | ORAL | 0 refills | Status: DC

## 2020-09-05 MED ORDER — CLOPIDOGREL BISULFATE 75 MG PO TABS: 75.0000 mg | ORAL_TABLET | Freq: Every day | ORAL | 0 refills | Status: DC

## 2020-09-05 NOTE — Patient Instructions (Signed)
Visit Information It was great speaking with you today!  Please let me know if you have any questions about our visit.   Goals Addressed             This Visit's Progress    Monitor and Manage My Blood Sugar-Diabetes Type 2   On track    Timeframe:  Long-Range Goal Priority:  High Start Date: 04/15/20      Expected End Date: 02/26/21                   Follow Up Date 10/22/20   - Check blood sugar daily before breakfast - Check blood sugar if I feel it is too high or too low - Enter blood sugar readings and medication or insulin into daily log - Take the blood sugar log to all doctor visits - Take the blood sugar meter to all doctor visits           Patient Care Plan: General Pharmacy (Adult)     Problem Identified: Hypertension, Hyperlipidemia, Diabetes, Atrial Fibrillation, Heart Failure, Coronary Artery Disease, GERD, Asthma, Depression and Anxiety   Priority: High     Long-Range Goal: Patient-Specific Goal   Start Date: 06/06/2020  Expected End Date: 12/07/2020  This Visit's Progress: On track  Recent Progress: On track  Priority: High  Note:   Current Barriers:  Unable to achieve control of diabetes  Does not adhere to prescribed medication regimen  Pharmacist Clinical Goal(s):  Over the next 90 days, patient will achieve control of diabetes as evidenced by A1c less than 8% adhere to prescribed medication regimen as evidenced by utilization of enhanced pharmacy services through collaboration with PharmD and provider.   Interventions: 1:1 collaboration with [name]., MD regarding development and update of comprehensive plan of care as evidenced by provider attestation and co-signature Inter-disciplinary care team collaboration (see longitudinal plan of care) Comprehensive medication review performed; medication list updated in electronic medical record  Diabetes (A1c goal <7%) -Uncontrolled -Diagnosed 2010 -Current medications: Trulicity 1.5 mg  weekly on Tuesdays   Jardiance 25 mg daily  -Medications previously tried: Glipizide, Lantus, Tradjenta, Metformin, Metformin XR, Ozempic   -Patient started Trulicity 1.5 mg on 7/82/42. She reported significant diarrhea and nausea with the first dose that lasted for a few days. Her second dose also resulted in those symptoms but less severe. Counseled patient on likelyhood of symptoms improving over time and how to manage symptoms as they appear.  -Current home glucose readings: Fasting:-385, 285, 230, 231 -Denies hypoglycemic/hyperglycemic symptoms -Current meal patterns: eating more desserts recently. 1/2 serving banana pudding, 1/2 slice of strawberry cheescake. Drinking less water.  breakfast: Often skips  lunch: Often skips dinner: Country style steak + gravy + mashed potatoes + broccoli + cheese sauce  snacks: 1/2 serving banana pudding drinks: Glucerna shakes (1x daily), water, regular ginger ale  -Current exercise: walking twice daily (15 minute sessions)   Hyperlipidemia: (LDL goal < 70) -History of CABG x4 2017, 2018  -Uncontrolled -Current treatment: Rosuvastatin 40 mg daily Ezetimibe 10 mg daily   -Current antiplatelet treatment: Clopidogrel 75 mg daily  -Medications previously tried: Atorvastatin (changed to rosuvastatin during hospitalization) -Patient reports tolerating new cholesterol regimen well, denies myalgias  -Cholesterol improved, but still uncontrolled. Could consider switching Zetia to Repatha for improved benefit. Will defer until next visit.  -Recommended to continue current medication  Atrial Fibrillation (Goal: prevent stroke and major bleeding) -Controlled -CHADSVASC: 6 -Current treatment: Rate control: Metoprolol tartrate 100  mg twice daily  Anticoagulation: Eliquis 5 mg twice daily  -Medications previously tried: NA -Home BP and HR readings: NA  -Counseled on importance of adherence to anticoagulant exactly as prescribed; avoidance of NSAIDs due to  increased bleeding risk with anticoagulants; Careful monitoring due to increased bleeding risk with clopidogrel + Eliquis -Recommended to continue current medication  Heart Failure (Goal: manage symptoms and prevent exacerbations) -Controlled -Last ejection fraction: 55-60% (Date: June 2021) -HF type: Diastolic -NYHA Class: III (marked limitation of activity) -AHA HF Stage: C (Heart disease and symptoms present) -Current treatment: Imdur 30 mg daily  Losartan 25 mg daily Metoprolol tartrate 100 mg twice daily  -Medications previously tried: NA  -Current home BP/HR readings: NA -Educated on Importance of weighing daily; if you gain more than 3 pounds in one day or 5 pounds in one week, contact provider's office -Recommended to continue current medication  Depression/Anxiety (Goal: maintain stable mood) -Controlled -Current treatment: Sertraline 50 mg daily  -Medications previously tried/failed: NA -PHQ9: 0 -GAD7: 1 - Patient reports she manages her stress by talking with daughter, reading, or medidating  -Educated on Benefits of medication for symptom control -Recommended to continue current medication  GERD with dysphagia (Goal: prevent heartburn or reflux symptoms) -Controlled -Current treatment  Pantoprazole 40 mg daily   -Medications previously tried: Sucralfate,  -Continue current medications  Patient Goals/Self-Care Activities Over the next 90 days, patient will:  - check glucose daily, before breakfast, document, and provide at future appointments check blood pressure weekly, document, and provide at future appointments  Follow Up Plan: Telephone follow up appointment with care management team member scheduled for:  7/29/202 at 11:00 AM     Patient agreed to services and verbal consent obtained.   The patient verbalized understanding of instructions, educational materials, and care plan provided today and declined offer to receive copy of patient instructions,  educational materials, and care plan.   Junius Argyle, PharmD, JAARS [phone]

## 2020-09-05 NOTE — Progress Notes (Signed)
Chronic Care Management Pharmacy Note  09/05/2020 Name:  Candace [name] MRN:  [id number] DOB:  April 16, 1950  Summary: Patient has had multiple episodes of diarrhea + nausea since increasing Trulicity. Her symptoms are slowly improving and she is amenable to continuing Trulicity.  Recommendations/Changes made from today's visit: Continue current medications   Plan: [facility] Medication sync call in one week  CPP Follow-up in 6 weeks   Subjective: Candace [name] is an 70 y.o. year old female who is a primary patient of [name]., MD.  The CCM team was consulted for assistance with disease management and care coordination needs.    Engaged with patient by telephone for follow up visit in response to provider referral for pharmacy case management and/or care coordination services.   Consent to Services:  The patient was given information about Chronic Care Management services, agreed to services, and gave verbal consent prior to initiation of services.  Please see initial visit note for detailed documentation.   Patient Care Team: [name]., MD as PCP - General (Family Medicine) Corey Skains, MD as Consulting Physician (Cardiology) Sharlotte Alamo, DPM (Podiatry) Neldon Labella, RN as Case Manager Virgel Manifold, MD as Consulting Physician (Gastroenterology) Pa, [address], Glean Salvo, [facility] as Pharmacist (Pharmacist)  Recent office visits: 08/11/20: Patient presented to Dr. Rosanna Randy for follow-up. A1c improved to 9.1%. Patient started on losartan 25 mg daily.  05/13/20: Patient presented to Dr. Rosanna Randy for viral URI. Patient started on benzonatate.  05/01/20: Patient presented to Dr. Rosanna Randy for follow-up. A1c 12.2%. Patient started on Trulicity 5.95 mg weekly. Carafate  04/02/20: Patient presented to [facility], LPN for AWV.   Recent consult visits: 08/07/20: Patient presented to Dr. Nehemiah Massed (Cardiology) for follow-up. No  medication changes noted.  07/07/20: Patient presented to Dr. Nehemiah Massed (Cardiology) for follow-up. No medication changes noted. 02/27/21: Patient presented to Hassell Done, NP. Patient started on Eliquis 5 mg twice daily for A-Fib. Non-adherence to atorvastatin   Hospital visits: 07/09/20: Patient hospitalized for chest pain. Aspirin stopped. Clopidogrel 7.3m daily, Ezetimibe 10 mg daily started. 3/22-3/23: Patient hospitalized for chest pain. Demand ischemia, possible pericarditis. Patient started on aspirin 81 mg daily, colchicine 0.6 mg twice daily   Objective:  Lab Results  Component Value Date   CREATININE 0.64 08/11/2020   BUN 10 08/11/2020   GFRNONAA >60 06/17/2020   GFRAA 108 02/06/2020   NA 144 08/11/2020   K 4.1 08/11/2020   CALCIUM 9.4 08/11/2020   CO2 22 08/11/2020    Lab Results  Component Value Date/Time   HGBA1C 9.1 (H) 08/11/2020 09:32 AM   HGBA1C 12.2 (A) 05/01/2020 09:55 AM   HGBA1C 12.4 (A) 12/06/2019 11:35 AM   HGBA1C 9.8 (H) 08/30/2019 07:15 AM   HGBA1C 9.2 07/10/2019 12:00 AM   HGBA1C 8.3 (H) 07/14/2013 05:42 AM   MICROALBUR 50 08/11/2020 08:51 AM    Last diabetic Eye exam:  Lab Results  Component Value Date/Time   HMDIABEYEEXA No Retinopathy 06/28/2019 12:00 AM    Last diabetic Foot exam: No results found for: HMDIABFOOTEX   Lab Results  Component Value Date   CHOL 164 08/11/2020   HDL 57 08/11/2020   LDLCALC 90 08/11/2020   TRIG 94 08/11/2020   CHOLHDL 2.9 08/11/2020    Hepatic Function Latest Ref Rng & Units 08/11/2020 02/06/2020 11/21/2019  Total Protein 6.0 - 8.5 g/dL 6.4 6.9 -  Albumin 3.8 - 4.8 g/dL 4.4 4.4 -  AST  0 - 40 IU/L 16 18 -  ALT 0 - 32 IU/L 17 13 -  Alk Phosphatase 44 - 121 IU/L 117 135(H) 139(H)  Total Bilirubin 0.0 - 1.2 mg/dL 0.4 0.2 -  Bilirubin, Direct 0.0 - 0.2 mg/dL - - -    Lab Results  Component Value Date/Time   TSH 1.180 01/03/2019 09:57 AM   TSH 1.262 09/14/2018 05:04 PM   TSH 1.661 12/23/2016 07:03 PM   TSH  0.818 10/01/2016 09:35 AM    CBC Latest Ref Rng & Units 08/11/2020 06/17/2020 08/31/2019  WBC 3.4 - 10.8 x10E3/uL 6.3 6.9 6.8  Hemoglobin 11.1 - 15.9 g/dL 13.5 13.6 12.4  Hematocrit 34.0 - 46.6 % 41.3 42.0 37.7  Platelets 150 - 450 x10E3/uL 192 211 190    Lab Results  Component Value Date/Time   VD25OH 12.2 (L) 10/01/2016 09:35 AM    Clinical ASCVD: Yes  The ASCVD Risk score Mikey Bussing DC Jr., et al., 2013) failed to calculate for the following reasons:   The patient has a prior MI or stroke diagnosis    Depression screen [facility] 2/9 08/05/2020 04/15/2020 04/02/2020  Decreased Interest 1 0 0  Down, Depressed, Hopeless 1 0 0  PHQ - 2 Score 2 0 0  Altered sleeping 1 - -  Tired, decreased energy 1 - -  Change in appetite 1 - -  Feeling bad or failure about yourself  0 - -  Trouble concentrating 0 - -  Moving slowly or fidgety/restless 0 - -  Suicidal thoughts 0 - -  PHQ-9 Score 5 - -  Difficult doing work/chores Not difficult at all - -  Some recent data might be hidden    Social History   Tobacco Use  Smoking Status Former   Packs/day: 0.50   Pack years: 0.00   Types: Cigarettes   Quit date: 10/07/2001   Years since quitting: 18.9  Smokeless Tobacco Never   BP Readings from Last 3 Encounters:  08/11/20 108/76  06/18/20 (!) 101/49  05/13/20 (!) 166/95   Pulse Readings from Last 3 Encounters:  08/11/20 83  06/18/20 61  05/13/20 86   Wt Readings from Last 3 Encounters:  08/11/20 169 lb (76.7 kg)  08/05/20 166 lb (75.3 kg)  06/18/20 164 lb 3.2 oz (74.5 kg)    Assessment/Interventions: Review of patient past medical history, allergies, medications, health status, including review of consultants reports, laboratory and other test data, was performed as part of comprehensive evaluation and provision of chronic care management services.   SDOH:  (Social Determinants of Health) assessments and interventions performed: Yes SDOH Interventions    Flowsheet Row Most Recent Value   SDOH Interventions   Financial Strain Interventions Intervention Not Indicated        CCM Care Plan  Allergies  Allergen Reactions   Lisinopril Cough   Penicillins Swelling, Rash and Other (See Comments)    Did it involve swelling of the face/tongue/throat, SOB, or low BP? Yes Did it involve sudden or severe rash/hives, skin peeling, or any reaction on the inside of your mouth or nose? Yes Did you need to seek medical attention at a hospital or doctor's office? Yes When did it last happen?      15 years If all above answers are "NO", may proceed with cephalosporin use.     Medications Reviewed Today     Reviewed by Germaine Pomfret, [facility] (Pharmacist) on 09/05/20 at 0859  Med List Status: <None>   Medication Order Taking?  Sig Documenting Provider Last Dose Status Informant  acetaminophen (TYLENOL) 325 MG tablet [id number] No Take 650 mg by mouth every 6 (six) hours as needed for moderate pain or headache.  [provider] Taking Active Self  bismuth subsalicylate (PEPTO BISMOL) 262 MG chewable tablet [id number] No Chew 524 mg by mouth as needed. [provider] Taking Active Self  Discontinued 03/06/19 1830   clopidogrel (PLAVIX) 75 MG tablet [id number] No Take 75 mg by mouth daily. [provider] Taking Active   Dulaglutide (TRULICITY) 1.5 AS/3.4HD SOPN [id number] No Inject 1.5 mg into the skin once a week. [name]., MD Taking Active   ELIQUIS 5 MG TABS tablet [id number] No Take 5 mg by mouth 2 (two) times daily. [provider] Taking Active Self           Med Note Michaelle Birks, Glean Salvo   Fri Jun 06, 2020 11:56 AM) Prescribed by Dr. Nehemiah Massed  ezetimibe (ZETIA) 10 MG tablet [id number] No Take 10 mg by mouth daily. [provider] Taking Active   isosorbide mononitrate (IMDUR) 30 MG 24 hr tablet [id number] No Take 30 mg by mouth daily. [provider] Taking Active   JARDIANCE 25 MG TABS tablet [id number] No TAKE ONE  TABLET BY MOUTH BEFORE BREAKFAST [name]., MD Taking Active   Lancets (ONETOUCH DELICA PLUS XAJOIN86V) [place] [id number] No USE UP TO 4 TIMES DAILY AS DIRECTED [name]., MD Taking Active Self  loperamide (IMODIUM) 2 MG capsule [id number] No Take 2 mg by mouth as needed. [provider] Taking Active Self  losartan (COZAAR) 25 MG tablet [id number]  Take 1 tablet (25 mg total) by mouth daily. [name]., MD  Active   metoprolol tartrate (LOPRESSOR) 100 MG tablet [id number] No Take 100 mg by mouth 2 (two) times daily. [provider] Taking Active Self           Med Note Michaelle Birks, Glean Salvo   Fri Jun 06, 2020 11:56 AM) Prescribed by Dr. Nehemiah Massed   nitroGLYCERIN (NITROSTAT) 0.4 MG SL tablet [id number] No Place 1 tablet (0.4 mg total) under the tongue every 5 (five) minutes as needed for chest pain. Bettey Costa, MD Taking Active Self           Med Note Johny Drilling Mar 27, 2018  2:39 PM)    nystatin (NYSTATIN) powder [id number] No Apply 1 application topically daily.  Patient taking differently: Apply 1 application topically daily as needed.   Rubie Maid, MD Taking Active   ondansetron (ZOFRAN-ODT) 8 MG disintegrating tablet [id number] No Take 1 tablet (8 mg total) by mouth every 8 (eight) hours as needed. [name]., MD Taking Active   pantoprazole (PROTONIX) 20 MG tablet [id number] No Take 1 tablet (20 mg total) by mouth 2 (two) times daily. [name]., MD Taking Active Self  rosuvastatin (CRESTOR) 40 MG tablet [id number] No Take 40 mg by mouth daily. [provider] Taking Active   sertraline (ZOLOFT) 50 MG tablet [id number] No Take 1 tablet (50 mg total) by mouth daily. [name]., MD Taking Active   Patient not taking:  Discontinued 09/05/20 0859 (Patient Preference)           Patient Active Problem List   Diagnosis Date Noted   Vertigo    Unsteady gait    Acute bronchitis 03/07/2019    Esophageal dysphagia    Stomach irritation  Gastric polyp    Esophageal lump    Hx of colonic polyp    Polyp of colon    Diverticulosis of large intestine without diverticulitis    PSVT (paroxysmal supraventricular tachycardia) (HCC) 10/03/2018   Abnormal ECG 07/05/2018   Tachycardia 03/27/2018   Pain in limb 02/28/2018   Chronic diastolic CHF (congestive heart failure) ([place]) 11/03/2017   TIA (transient ischemic attack) 11/03/2017   COPD suggested by initial evaluation ([place]) 08/23/2017   Acute and chronic respiratory failure with hypoxia ([place]) 08/23/2017   Postoperative state 08/15/2017   Incidental pulmonary nodule 08/01/2017   Non-ST elevation myocardial infarction (NSTEMI), subendocardial infarction, subsequent episode of care ([place]) 06/01/2017   B12 deficiency 12/01/2016   Vitamin D deficiency 11/04/2016   Depression with anxiety 09/30/2016   AF (paroxysmal atrial fibrillation) ([place]) 09/30/2016   Resting tremor 09/30/2016   Mild obstructive sleep apnea 09/30/2016   Headache 08/18/2016   Unstable angina ([place]) 08/03/2016   Type 2 diabetes mellitus with complication, without long-term current use of insulin ([place]) 08/03/2016   Chronic daily headache 05/05/2016   Asthma 11/11/2015   S/P CABG x 4 11/10/2015   Coronary artery disease 11/07/2015   Carotid artery narrowing 02/08/2014   Chest pain 02/08/2014   Mixed hyperlipidemia 02/08/2014   Bilateral carotid artery stenosis 02/08/2014   Atypical chest pain 02/07/2014   Essential hypertension 02/07/2014   Awareness of heartbeats 02/07/2014   D (diarrhea) 10/05/2013   Gastroesophageal reflux disease 10/05/2013    Immunization History  Administered Date(s) Administered   Fluad Quad(high Dose 65+) 12/28/2018   Influenza, High Dose Seasonal PF 12/24/2016, 12/08/2017   Influenza,inj,Quad PF,6+ Mos 12/19/2015, 01/18/2020   Influenza-Unspecified 03/06/2015   Moderna Sars-Covid-2 Vaccination 04/14/2019, 05/12/2019, 01/28/2020    Pneumococcal Conjugate-13 03/16/2018   Pneumococcal Polysaccharide-23 12/24/2016   Tdap 07/28/2010    Conditions to be addressed/monitored:  Hypertension, Hyperlipidemia, Diabetes, Atrial Fibrillation, Heart Failure, Coronary Artery Disease, GERD, Asthma, Depression and Anxiety  Care Plan : General Pharmacy (Adult)  Updates made by Germaine Pomfret, RPH since 09/05/2020 12:00 AM     Problem: Hypertension, Hyperlipidemia, Diabetes, Atrial Fibrillation, Heart Failure, Coronary Artery Disease, GERD, Asthma, Depression and Anxiety   Priority: High     Long-Range Goal: Patient-Specific Goal   Start Date: 06/06/2020  Expected End Date: 12/07/2020  This Visit's Progress: On track  Recent Progress: On track  Priority: High  Note:   Current Barriers:  Unable to achieve control of diabetes  Does not adhere to prescribed medication regimen  Pharmacist Clinical Goal(s):  Over the next 90 days, patient will achieve control of diabetes as evidenced by A1c less than 8% adhere to prescribed medication regimen as evidenced by utilization of enhanced pharmacy services through collaboration with PharmD and provider.   Interventions: 1:1 collaboration with [name]., MD regarding development and update of comprehensive plan of care as evidenced by provider attestation and co-signature Inter-disciplinary care team collaboration (see longitudinal plan of care) Comprehensive medication review performed; medication list updated in electronic medical record  Diabetes (A1c goal <7%) -Uncontrolled -Diagnosed 2010 -Current medications: Trulicity 1.5 mg weekly on Tuesdays   Jardiance 25 mg daily  -Medications previously tried: Glipizide, Lantus, Tradjenta, Metformin, Metformin XR, Ozempic   -Patient started Trulicity 1.5 mg on 2/50/53. She reported significant diarrhea and nausea with the first dose that lasted for a few days. Her second dose also resulted in those symptoms but less  severe. Counseled patient on likelyhood of symptoms improving over time and  how to manage symptoms as they appear.  -Current home glucose readings: Fasting:-385, 285, 230, 231 -Denies hypoglycemic/hyperglycemic symptoms -Current meal patterns: eating more desserts recently. 1/2 serving banana pudding, 1/2 slice of strawberry cheescake. Drinking less water.  breakfast: Often skips  lunch: Often skips dinner: Country style steak + gravy + mashed potatoes + broccoli + cheese sauce  snacks: 1/2 serving banana pudding drinks: Glucerna shakes (1x daily), water, regular ginger ale  -Current exercise: walking twice daily (15 minute sessions)   Hyperlipidemia: (LDL goal < 70) -History of CABG x4 2017, 2018  -Uncontrolled -Current treatment: Rosuvastatin 40 mg daily Ezetimibe 10 mg daily   -Current antiplatelet treatment: Clopidogrel 75 mg daily  -Medications previously tried: Atorvastatin (changed to rosuvastatin during hospitalization) -Patient reports tolerating new cholesterol regimen well, denies myalgias  -Cholesterol improved, but still uncontrolled. Could consider switching Zetia to Repatha for improved benefit. Will defer until next visit.  -Recommended to continue current medication  Atrial Fibrillation (Goal: prevent stroke and major bleeding) -Controlled -CHADSVASC: 6 -Current treatment: Rate control: Metoprolol tartrate 100 mg twice daily  Anticoagulation: Eliquis 5 mg twice daily  -Medications previously tried: NA -Home BP and HR readings: NA  -Counseled on importance of adherence to anticoagulant exactly as prescribed; avoidance of NSAIDs due to increased bleeding risk with anticoagulants; Careful monitoring due to increased bleeding risk with clopidogrel + Eliquis -Recommended to continue current medication  Heart Failure (Goal: manage symptoms and prevent exacerbations) -Controlled -Last ejection fraction: 55-60% (Date: June 2021) -HF type: Diastolic -NYHA Class: III  (marked limitation of activity) -AHA HF Stage: C (Heart disease and symptoms present) -Current treatment: Imdur 30 mg daily  Losartan 25 mg daily Metoprolol tartrate 100 mg twice daily  -Medications previously tried: NA  -Current home BP/HR readings: NA -Educated on Importance of weighing daily; if you gain more than 3 pounds in one day or 5 pounds in one week, contact provider's office -Recommended to continue current medication  Depression/Anxiety (Goal: maintain stable mood) -Controlled -Current treatment: Sertraline 50 mg daily  -Medications previously tried/failed: NA -PHQ9: 0 -GAD7: 1 - Patient reports she manages her stress by talking with daughter, reading, or medidating  -Educated on Benefits of medication for symptom control -Recommended to continue current medication  GERD with dysphagia (Goal: prevent heartburn or reflux symptoms) -Controlled -Current treatment  Pantoprazole 40 mg daily   -Medications previously tried: Sucralfate,  -Continue current medications  Patient Goals/Self-Care Activities Over the next 90 days, patient will:  - check glucose daily, before breakfast, document, and provide at future appointments check blood pressure weekly, document, and provide at future appointments  Follow Up Plan: Telephone follow up appointment with care management team member scheduled for:  7/29/202 at 11:00 AM       Medication Assistance: None required.  Patient affirms current coverage meets needs.  Patient's preferred pharmacy is:  [facility] - [place], [place] - [address] [place] [address] Phone: [phone] Fax: [phone]  Uses pill box? No - doesn't think she needs Pt endorses 100% compliance  We discussed: Benefits of medication synchronization, packaging and delivery as well as enhanced pharmacist oversight with [facility]. Patient decided to: Utilize [facility] pharmacy for medication  synchronization, packaging and delivery   Care Plan and Follow Up Patient Decision:  Patient agrees to Care Plan and Follow-up.  Plan: Telephone follow up appointment with care management team member scheduled for:  7/29/202 at 11:00 AM  [place] Pharmacist  [employer] [phone]

## 2020-09-08 ENCOUNTER — Encounter: Payer: Medicare Other | Admitting: *Deleted

## 2020-09-08 ENCOUNTER — Other Ambulatory Visit: Payer: Self-pay

## 2020-09-08 DIAGNOSIS — I214 Non-ST elevation (NSTEMI) myocardial infarction: Secondary | ICD-10-CM

## 2020-09-08 NOTE — Progress Notes (Signed)
Daily Session Note  Patient Details  Name: SYRA SIRMONS MRN: [id number] Date of Birth: May 15, 1950 Referring Provider:   Flowsheet Row Cardiac Rehab from 08/05/2020 in [facility] Cardiac [facility]  Referring Provider [name]       Encounter Date: 09/08/2020  Check In:  Session Check In - 09/08/20 0908       Check-In   Supervising physician immediately available to respond to emergencies See telemetry face sheet for immediately available ER MD    Location [facility]-Cardiac & Pulmonary Rehab    Staff Present Heath Lark, RN, BSN, CCRP;Joseph Hood RCP,RRT,BSRT;Kelly [place], [place], ACSM CEP, Exercise Physiologist    Virtual Visit No    Medication changes reported     Yes    Comments added furosemide daily started taking on Sat   3 pounds decrease in weight today    Fall or balance concerns reported    No    Warm-up and Cool-down Performed on first and last piece of equipment    Resistance Training Performed Yes    VAD Patient? No    PAD/SET Patient? No      Pain Assessment   Currently in Pain? No/denies                Social History   Tobacco Use  Smoking Status Former   Packs/day: 0.50   Pack years: 0.00   Types: Cigarettes   Quit date: 10/07/2001   Years since quitting: 18.9  Smokeless Tobacco Never    Goals Met:  Independence with exercise equipment Exercise tolerated well No report of cardiac concerns or symptoms  Goals Unmet:  Not Applicable  Comments: Pt able to follow exercise prescription today without complaint.  Will continue to monitor for progression.    Dr. Emily Filbert is Medical Director for [place].  Dr. Ottie Glazier is Medical Director for [facility] Pulmonary Rehabilitation.

## 2020-09-09 ENCOUNTER — Ambulatory Visit: Payer: Medicaid Other

## 2020-09-09 DIAGNOSIS — E1165 Type 2 diabetes mellitus with hyperglycemia: Secondary | ICD-10-CM | POA: Diagnosis not present

## 2020-09-09 DIAGNOSIS — I214 Non-ST elevation (NSTEMI) myocardial infarction: Secondary | ICD-10-CM | POA: Diagnosis not present

## 2020-09-09 DIAGNOSIS — I5032 Chronic diastolic (congestive) heart failure: Secondary | ICD-10-CM

## 2020-09-09 DIAGNOSIS — E785 Hyperlipidemia, unspecified: Secondary | ICD-10-CM | POA: Diagnosis not present

## 2020-09-09 NOTE — Chronic Care Management (AMB) (Signed)
Chronic Care Management   Follow Up Note   09/09/2020 Name: Candace [name] MRN: [id number] DOB: 12-18-1950  Primary Care Provider: [name]., MD Reason for referral : Chronic Care Management   Candace [name] is a 70 y.o. year old female who is a primary care patient of [name]., MD. She is currently enrolled in the Chronic Care Management program.  Review of Ms. Egge's status, including review of consultants reports, relevant labs and test results was conducted today. Collaboration with appropriate care team members was performed as part of the comprehensive evaluation and provision of chronic care management services.     SDOH (Social Determinants of Health) assessments performed: No    Outpatient Encounter Medications as of 09/09/2020  Medication Sig Note   acetaminophen (TYLENOL) 325 MG tablet Take 650 mg by mouth every 6 (six) hours as needed for moderate pain or headache.     bismuth subsalicylate (PEPTO BISMOL) 262 MG chewable tablet Chew 524 mg by mouth as needed.    clopidogrel (PLAVIX) 75 MG tablet Take 1 tablet (75 mg total) by mouth daily.    Dulaglutide (TRULICITY) 1.5 BW/4.6KZ SOPN Inject 1.5 mg into the skin once a week.    ELIQUIS 5 MG TABS tablet Take 5 mg by mouth 2 (two) times daily. 06/06/2020: Prescribed by Dr. Nehemiah Massed   ezetimibe (ZETIA) 10 MG tablet Take 1 tablet (10 mg total) by mouth daily.    isosorbide mononitrate (IMDUR) 30 MG 24 hr tablet Take 30 mg by mouth daily.    JARDIANCE 25 MG TABS tablet TAKE ONE TABLET BY MOUTH BEFORE BREAKFAST    Lancets (ONETOUCH DELICA PLUS [id number]) MISC USE UP TO 4 TIMES DAILY AS DIRECTED    loperamide (IMODIUM) 2 MG capsule Take 2 mg by mouth as needed.    losartan (COZAAR) 25 MG tablet Take 1 tablet (25 mg total) by mouth daily.    metoprolol tartrate (LOPRESSOR) 100 MG tablet Take 100 mg by mouth 2 (two) times daily. 06/06/2020: Prescribed by Dr. Nehemiah Massed    nitroGLYCERIN (NITROSTAT) 0.4 MG  SL tablet Place 1 tablet (0.4 mg total) under the tongue every 5 (five) minutes as needed for chest pain.    nystatin (NYSTATIN) powder Apply 1 application topically daily. (Patient taking differently: Apply 1 application topically daily as needed.)    ondansetron (ZOFRAN-ODT) 8 MG disintegrating tablet Take 1 tablet (8 mg total) by mouth every 8 (eight) hours as needed.    pantoprazole (PROTONIX) 20 MG tablet Take 1 tablet (20 mg total) by mouth 2 (two) times daily.    rosuvastatin (CRESTOR) 40 MG tablet Take 1 tablet (40 mg total) by mouth daily.    sertraline (ZOLOFT) 50 MG tablet Take 1 tablet (50 mg total) by mouth daily.    [DISCONTINUED] cetirizine (ZYRTEC) 5 MG tablet Take 2 tablets (10 mg total) by mouth daily.    No facility-administered encounter medications on file as of 09/09/2020.     Objective:  Patient Care Plan: Heart Failure (Adult)   Problem Identified: Symptom Exacerbation (Heart Failure)   Priority: High  Onset Date: 04/15/2020     Long-Range Goal: Symptom Exacerbation Prevented or Minimized   Start Date: 09/09/2020  Expected End Date: 01/07/2021  Recent Progress: On track  Priority: High     Current Barriers:  Chronic Disease Management support and educational needs r/t CHF.  Case Manager Clinical Goal(s):  Over the next 120 days, patient will not require hospitalization or emergent evaluation  d/t complications r/t CHF exacerbation.  Interventions:  Collaboration with [name]., MD regarding development and update of comprehensive plan of care as evidenced by provider attestation and co-signature Inter-disciplinary care team collaboration (see longitudinal plan of care) Reviewed medications and current plan for CHF management. Reports starting Lasix 20 mg/day on 09/06/20. She was instructed to adjust the dose d/t not being able to tolerate her Cardiac Rehab session on 09/08/20. Reports experiencing lightheadedness and dizziness after starting the  session. Recalls her systolic BP dropping to 99. Reports feeling well today. She anticipates continuing Cardiac Rehab three times a week. She will adjust lasix to every other day until she is evaluated by the Cardiology team.  Reviewed established weight parameters and indications for notifying a provider. Reports weights have been stable over the last few days. Reports weight of 166 lbs on yesterday. Weight was 165 lbs today. Encouraged to continue weighing daily and recording readings. Denies abdominal or lower extremity edema. Denies episodes of chest pain, palpitations or shortness of breath. Reports doing well with maintaining a cardiac prudent/heart healthy diet. Reviewed worsening symptoms r/t CHF exacerbation and indications for seeking immediate medical attention.     Patient Goals/Self-Care Activities:  -Take medications as prescribed -Follow plan for CHF symptom management and notify provider with concerns -Monitor and record weights -Adhere to recommended cardiac diet -Notify provider or care management team with questions or new concerns as needed    Follow Up Plan:  Will follow up next month       PLAN:  A member of the care management team will follow up next month.     [name] Care Management [facility] [phone]

## 2020-09-10 ENCOUNTER — Telehealth: Payer: Self-pay

## 2020-09-10 ENCOUNTER — Other Ambulatory Visit: Payer: Self-pay

## 2020-09-10 ENCOUNTER — Encounter: Payer: Self-pay | Admitting: *Deleted

## 2020-09-10 DIAGNOSIS — I214 Non-ST elevation (NSTEMI) myocardial infarction: Secondary | ICD-10-CM

## 2020-09-10 NOTE — Progress Notes (Signed)
Daily Session Note  Patient Details  Name: Candace [name] MRN: [id number] Date of Birth: [birth year] Referring Provider:   Flowsheet Row Cardiac Rehab from 08/05/2020 in [facility] Cardiac [facility] Re[facility]  Referring Provider [name]       Encounter Date: 09/10/2020  Check In:  Session Check In - 09/10/20 0747       Check-In   Supervising physician immediately available to respond to emergencies See telemetry face sheet for immediately available ER MD    Location [facility]-Cardiac & Pulmonary Rehab    Staff Present Birdie Sons, MPA, RN;Melissa Caiola RDN, LDN;Joseph Tessie Fass RCP,RRT,BSRT    Virtual Visit No    Medication changes reported     No    Fall or balance concerns reported    No    Warm-up and Cool-down Performed on first and last piece of equipment    Resistance Training Performed Yes    VAD Patient? No    PAD/SET Patient? No      Pain Assessment   Currently in Pain? No/denies                Social History   Tobacco Use  Smoking Status Former   Packs/day: 0.50   Pack years: 0.00   Types: Cigarettes   Quit date: 10/07/2001   Years since quitting: 18.9  Smokeless Tobacco Never    Goals Met:  Independence with exercise equipment Exercise tolerated well No report of cardiac concerns or symptoms Strength training completed today  Goals Unmet:  Not Applicable  Comments: Pt able to follow exercise prescription today without complaint.  Will continue to monitor for progression.    Dr. Emily Filbert is Medical Director for [place].  Dr. Ottie Glazier is Medical Director for [facility] Pulmonary Rehabilitation.

## 2020-09-10 NOTE — Chronic Care Management (AMB) (Signed)
Chronic Care Management Pharmacy Assistant   Name: Candace [name]  MRN: [id number] DOB: 1950-05-08  Reason for Encounter: Medication Review/Medication Coordination Call   Recent office visits:  None  Recent consult visits:  09/10/2020 Birdie Sons, RN (Cardiac Rehab) - No medication changes noted 09/08/2020 Heath Lark, RN (Cardiac Rehab) - No medication changes noted 09/05/2020 Serafina Royals, MD (Cardiology) for Chest Tightness - Started FUROsemide (LASIX) 20 MG tablet; Take 1 tablet (20 mg total) by mouth once daily  Hospital visits:  None in previous 6 months  Medications: Outpatient Encounter Medications as of 09/10/2020  Medication Sig Note   acetaminophen (TYLENOL) 325 MG tablet Take 650 mg by mouth every 6 (six) hours as needed for moderate pain or headache.     bismuth subsalicylate (PEPTO BISMOL) 262 MG chewable tablet Chew 524 mg by mouth as needed.    clopidogrel (PLAVIX) 75 MG tablet Take 1 tablet (75 mg total) by mouth daily.    Dulaglutide (TRULICITY) 1.5 EB/5.8XE SOPN Inject 1.5 mg into the skin once a week.    ELIQUIS 5 MG TABS tablet Take 5 mg by mouth 2 (two) times daily. 06/06/2020: Prescribed by Dr. Nehemiah Massed   ezetimibe (ZETIA) 10 MG tablet Take 1 tablet (10 mg total) by mouth daily.    isosorbide mononitrate (IMDUR) 30 MG 24 hr tablet Take 30 mg by mouth daily.    JARDIANCE 25 MG TABS tablet TAKE ONE TABLET BY MOUTH BEFORE BREAKFAST    Lancets (ONETOUCH DELICA PLUS [id number]) MISC USE UP TO 4 TIMES DAILY AS DIRECTED    loperamide (IMODIUM) 2 MG capsule Take 2 mg by mouth as needed.    losartan (COZAAR) 25 MG tablet Take 1 tablet (25 mg total) by mouth daily.    metoprolol tartrate (LOPRESSOR) 100 MG tablet Take 100 mg by mouth 2 (two) times daily. 06/06/2020: Prescribed by Dr. Nehemiah Massed    nitroGLYCERIN (NITROSTAT) 0.4 MG SL tablet Place 1 tablet (0.4 mg total) under the tongue every 5 (five) minutes as needed for chest pain.    nystatin (NYSTATIN) powder  Apply 1 application topically daily. (Patient taking differently: Apply 1 application topically daily as needed.)    ondansetron (ZOFRAN-ODT) 8 MG disintegrating tablet Take 1 tablet (8 mg total) by mouth every 8 (eight) hours as needed.    pantoprazole (PROTONIX) 20 MG tablet Take 1 tablet (20 mg total) by mouth 2 (two) times daily.    rosuvastatin (CRESTOR) 40 MG tablet Take 1 tablet (40 mg total) by mouth daily.    sertraline (ZOLOFT) 50 MG tablet Take 1 tablet (50 mg total) by mouth daily.    [DISCONTINUED] cetirizine (ZYRTEC) 5 MG tablet Take 2 tablets (10 mg total) by mouth daily.    No facility-administered encounter medications on file as of 09/10/2020.    Star Rating Drugs: 06/18/2020 Atorvastatin 40 mg last filled for a 30-day supply at [place] 07/13/2020 Rosuvastatin 40 mg last filled for a 90- Day supply at [place] 08/15/2020 Losartan 25 mg last filled for a 30-Day supply at [facility] 09/09/2020 Jardiance 25 mg last filled for a 10-Day Supply at [facility] 01/29/1593 Trulicity 1.5 mg last filled for a 30-Day supply at [place] chart for medication changes ahead of medication coordination call.  BP Readings from Last 3 Encounters:  08/11/20 108/76  06/18/20 (!) 101/49  05/13/20 (!) 166/95    Lab Results  Component Value Date   HGBA1C 9.1 (H) 08/11/2020  Patient obtains medications through Adherence Packaging  30 Days   Last adherence delivery included:  Trulicity 1.5 mg inject 1.5 mg once a week Isosorbide mononitrate 30 mg one tablet daily - Breakfast Pantoprazole 20 mg one tablet two times a day- breakfast, Evening meals Losartan 25 mg one tablet daily- Bedtime Eliquis 5 mg 1 tablet two times daily breakfast, evening meals  Patient declined medications  last month  Jardiance 25 mg 1 tablet daily before breakfast- (receive 80 Day supply on 06/30/2020) Rosuvastatin 40 mg 1 tablet daily breakfast-(receive 90 day supply  from [facility] on 07/13/2020) ezetimibe 10 mg 1 tablet daily breakfast - (receive 90 day supply from [facility] on 07/13/2020) Clopidogrel 75 mg 1 tablet daily breakfast (receive 90 day supply from [facility] on 07/13/2020) Metoprolol 100 mg 1 tablet two times daily breakfast, evening meals-(receive 30 day supply from [facility] on 08/06/2020) Sertraline 50 mg 1 tablet daily breakfast - (receive 90 day supply from [facility] on 08/08/2020) Ondansetron 8 mg PRN Nitroglycerin 0.4 SL PRN   Patient is due for next adherence delivery on: 09/19/2020. 09/12/2020 VM left requesting patient to return the call @ 1114 09/19/2020- left message to return call. 09/22/2020- Patient contacted by Winnifred Friar, CMA, no answer, voicemail left to return call. 09/23/2020- Patient called back today, missed call, called patient back with no answer, left message to call [facility] for medication delivery.  [facility] Customer Service also notified to contact patient.   Pattricia Boss, [place] Pharmacist Assistant [phone]

## 2020-09-10 NOTE — Progress Notes (Signed)
Cardiac Individual Treatment Plan  Patient Details  Name: Candace [name] MRN: [id number] Date of Birth: [birth year] Referring Provider:   Flowsheet Row Cardiac Rehab from 08/05/2020 in [facility] Cardiac and Pulmonary Rehab  Referring Provider [name]       Initial Encounter Date:  Flowsheet Row Cardiac Rehab from 08/05/2020 in [facility] Cardiac and Pulmonary Rehab  Date 08/05/20       Visit Diagnosis: NSTEMI (non-ST elevated myocardial infarction) [facility])  Patient's Home Medications on Admission:  Current Outpatient Medications:    acetaminophen (TYLENOL) 325 MG tablet, Take 650 mg by mouth every 6 (six) hours as needed for moderate pain or headache. , Disp: , Rfl:    bismuth subsalicylate (PEPTO BISMOL) 262 MG chewable tablet, Chew 524 mg by mouth as needed., Disp: , Rfl:    clopidogrel (PLAVIX) 75 MG tablet, Take 1 tablet (75 mg total) by mouth daily., Disp: 90 tablet, Rfl: 0   Dulaglutide (TRULICITY) 1.5 CL/2.7NT SOPN, Inject 1.5 mg into the skin once a week., Disp: 2 mL, Rfl: 2   ELIQUIS 5 MG TABS tablet, Take 5 mg by mouth 2 (two) times daily., Disp: , Rfl:    ezetimibe (ZETIA) 10 MG tablet, Take 1 tablet (10 mg total) by mouth daily., Disp: 90 tablet, Rfl: 0   isosorbide mononitrate (IMDUR) 30 MG 24 hr tablet, Take 30 mg by mouth daily., Disp: , Rfl:    JARDIANCE 25 MG TABS tablet, TAKE ONE TABLET BY MOUTH BEFORE BREAKFAST, Disp: 30 tablet, Rfl: 2   Lancets (ONETOUCH DELICA PLUS [id number]) MISC, USE UP TO 4 TIMES DAILY AS DIRECTED, Disp: 100 each, Rfl: 0   loperamide (IMODIUM) 2 MG capsule, Take 2 mg by mouth as needed., Disp: , Rfl:    losartan (COZAAR) 25 MG tablet, Take 1 tablet (25 mg total) by mouth daily., Disp: 90 tablet, Rfl: 0   metoprolol tartrate (LOPRESSOR) 100 MG tablet, Take 100 mg by mouth 2 (two) times daily., Disp: , Rfl:    nitroGLYCERIN (NITROSTAT) 0.4 MG SL tablet, Place 1 tablet (0.4 mg total) under the tongue every 5 (five) minutes as needed for chest  pain., Disp: 30 tablet, Rfl: 0   nystatin (NYSTATIN) powder, Apply 1 application topically daily. (Patient taking differently: Apply 1 application topically daily as needed.), Disp: 60 g, Rfl: 5   ondansetron (ZOFRAN-ODT) 8 MG disintegrating tablet, Take 1 tablet (8 mg total) by mouth every 8 (eight) hours as needed., Disp: 20 tablet, Rfl: 1   pantoprazole (PROTONIX) 20 MG tablet, Take 1 tablet (20 mg total) by mouth 2 (two) times daily., Disp: 60 tablet, Rfl: 11   rosuvastatin (CRESTOR) 40 MG tablet, Take 1 tablet (40 mg total) by mouth daily., Disp: 90 tablet, Rfl: 0   sertraline (ZOLOFT) 50 MG tablet, Take 1 tablet (50 mg total) by mouth daily., Disp: 90 tablet, Rfl: 1  Past Medical History: Past Medical History:  Diagnosis Date   Allergy    Anemia    Anxiety    Arrhythmia    Arthritis    Atrial fibrillation ([place])    Coronary artery disease    Depression    Diabetes mellitus without complication ([place])    Dyspnea    doe   Dysrhythmia    GERD (gastroesophageal reflux disease)    Headache    History of hiatal hernia    Hyperlipidemia    Hypertension    Myocardial infarction [facility])    2016, 04/2017   Myocardial infarction with cardiac rehabilitation (  [place])    MI 2016/ CABG 8/17    FINISHED CARDIAC REHAB 3 WEEKS AGO   Panic attack    Pneumonia    Reflux    Stroke ([place]) 2015   showed up on MRI; no weakness noted   TIA (transient ischemic attack)    Voice tremor     Tobacco Use: Social History   Tobacco Use  Smoking Status Former   Packs/day: 0.50   Pack years: 0.00   Types: Cigarettes   Quit date: 10/07/2001   Years since quitting: 18.9  Smokeless Tobacco Never    Labs: Recent Review Flowsheet Data     Labs for ITP Cardiac and Pulmonary Rehab Latest Ref Rng & Units 08/31/2019 09/04/2019 12/06/2019 05/01/2020 08/11/2020   Cholestrol 100 - 199 mg/dL 234(H) - - - 164   LDLCALC 0 - 99 mg/dL 163(H) - - - 90   HDL >39 mg/dL 49 - - - 57   Trlycerides 0 - 149 mg/dL 110 - - - 94    Hemoglobin A1c 4.8 - 5.6 % - 9.4(A) 12.4(A) 12.2(A) 9.1(H)   PHART 7.350 - 7.450 - - - - -   PCO2ART 35.0 - 45.0 mmHg - - - - -   HCO3 20.0 - 24.0 mEq/L - - - - -   TCO2 0 - 100 mmol/L - - - - -   O2SAT % - - - - -        Exercise Target Goals: Exercise Program Goal: Individual exercise prescription set using results from initial 6 min walk test and THRR while considering  patient's activity barriers and safety.   Exercise Prescription Goal: Initial exercise prescription builds to 30-45 minutes a day of aerobic activity, 2-3 days per week.  Home exercise guidelines will be given to patient during program as part of exercise prescription that the participant will acknowledge.   Education: Aerobic Exercise: - Group verbal and visual presentation on the components of exercise prescription. Introduces F.I.T.T principle from ACSM for exercise prescriptions.  Reviews F.I.T.T. principles of aerobic exercise including progression. Written material given at graduation. Flowsheet Row Cardiac Rehab from 09/10/2020 in [facility] Cardiac and Pulmonary Rehab  Education need identified 08/05/20       Education: Resistance Exercise: - Group verbal and visual presentation on the components of exercise prescription. Introduces F.I.T.T principle from ACSM for exercise prescriptions  Reviews F.I.T.T. principles of resistance exercise including progression. Written material given at graduation.    Education: Exercise & Equipment Safety: - Individual verbal instruction and demonstration of equipment use and safety with use of the equipment. Flowsheet Row Cardiac Rehab from 09/10/2020 in [facility] Cardiac and Pulmonary Rehab  Date 08/05/20  Educator [facility]  Instruction Review Code 1- Verbalizes Understanding       Education: Exercise Physiology & General Exercise Guidelines: - Group verbal and written instruction with models to review the exercise physiology of the cardiovascular system and associated critical  values. Provides general exercise guidelines with specific guidelines to those with heart or lung disease.  Flowsheet Row Cardiac Rehab from 09/10/2020 in [facility] Cardiac and Pulmonary Rehab  Date 09/10/20  Educator AS  Instruction Review Code 1- Verbalizes Understanding       Education: Flexibility, Balance, Mind/Body Relaxation: - Group verbal and visual presentation with interactive activity on the components of exercise prescription. Introduces F.I.T.T principle from ACSM for exercise prescriptions. Reviews F.I.T.T. principles of flexibility and balance exercise training including progression. Also discusses the mind body connection.  Reviews various relaxation techniques  to help reduce and manage stress (i.e. Deep breathing, progressive muscle relaxation, and visualization). Balance handout provided to take home. Written material given at graduation. Flowsheet Row Cardiac Rehab from 02/08/2018 in [facility] Cardiac and Pulmonary Rehab  Date 01/03/18  Educator AS  Instruction Review Code 1- Verbalizes Understanding       Activity Barriers & Risk Stratification:  Activity Barriers & Cardiac Risk Stratification - 08/05/20 0905       Activity Barriers & Cardiac Risk Stratification   Activity Barriers Back Problems;Deconditioning;Shortness of Breath;Muscular Weakness;Balance Concerns;History of Falls    Cardiac Risk Stratification High             6 Minute Walk:  6 Minute Walk     Row Name 08/05/20 0903         6 Minute Walk   Phase Initial     Distance 930 feet     Walk Time 5.53 minutes     # of Rest Breaks 1  28 sec     MPH 1.91     METS 2.58     RPE 14     Perceived Dyspnea  3     VO2 Peak 9.05     Symptoms Yes (comment)     Comments chest tightness 7-10, legs aching (thighs and calves) 8/10, SOB, wobbly gait     Resting HR 85 bpm     Resting BP 122/64     Resting Oxygen Saturation  96 %     Exercise Oxygen Saturation  during 6 min walk 98 %     Max Ex. HR 128 bpm      Max Ex. BP 156/74     2 Minute Post BP 126/70              Oxygen Initial Assessment:   Oxygen Re-Evaluation:   Oxygen Discharge (Final Oxygen Re-Evaluation):   Initial Exercise Prescription:  Initial Exercise Prescription - 08/05/20 0900       Date of Initial Exercise RX and Referring Provider   Date 08/05/20    Referring Provider [name]      NuStep   Level 2    SPM 80    Minutes 15    METs 2.5      Arm Ergometer   Level 1    Watts 25    RPM 25    Minutes 15    METs 2      Track   Laps 25    Minutes 15    METs 2.4      Prescription Details   Frequency (times per week) 3    Duration Progress to 30 minutes of continuous aerobic without signs/symptoms of physical distress      Intensity   THRR 40-80% of Max Heartrate 111-138    Ratings of Perceived Exertion 11-13    Perceived Dyspnea 0-4      Progression   Progression Continue to progress workloads to maintain intensity without signs/symptoms of physical distress.      Resistance Training   Training Prescription Yes    Weight 3 lb    Reps 10-15             Perform Capillary Blood Glucose checks as needed.  Exercise Prescription Changes:   Exercise Prescription Changes     Row Name 08/05/20 0900 08/18/20 1200 09/02/20 1400         Response to Exercise   Blood Pressure (Admit) 122/64 100/58 120/68  Blood Pressure (Exercise) 156/74 142/80 162/88     Blood Pressure (Exit) 126/70 110/60 112/74     Heart Rate (Admit) 85 bpm 113 bpm 77 bpm     Heart Rate (Exercise) 128 bpm 162 bpm 126 bpm     Heart Rate (Exit) 76 bpm 109 bpm 77 bpm     Oxygen Saturation (Admit) 96 % -- --     Oxygen Saturation (Exercise) 98 % -- --     Rating of Perceived Exertion (Exercise) 14 13 13      Perceived Dyspnea (Exercise) 3 -- --     Symptoms wobbly gait, SOB, legs aching (8/10), chest tightness (7/10) high HR --     Comments walk test results first day --     Duration -- Progress to 30  minutes of  aerobic without signs/symptoms of physical distress Continue with 30 min of aerobic exercise without signs/symptoms of physical distress.     Intensity -- THRR unchanged THRR unchanged           Progression       Progression -- Continue to progress workloads to maintain intensity without signs/symptoms of physical distress. Continue to progress workloads to maintain intensity without signs/symptoms of physical distress.     Average METs -- 2.6 2.85           Resistance Training       Training Prescription -- Yes Yes     Weight -- 3 lb 3 lb     Reps -- 10-15 10-15           Interval Training       Interval Training -- -- No           NuStep       Level -- -- 2     Minutes -- -- 15     METs -- -- 3.5           Arm Ergometer       Level -- 1 --     Minutes -- 15 --     METs -- 2.71 --           Track       Laps -- 25 22     Minutes -- 15 15     METs -- 2.4 2.2             Exercise Comments:   Exercise Comments     Row Name 08/18/20 0857           Exercise Comments First full day of exercise!  Patient was oriented to gym and equipment including functions, settings, policies, and procedures.  Patient's individual exercise prescription and treatment plan were reviewed.  All starting workloads were established based on the results of the 6 minute walk test done at initial orientation visit.  The plan for exercise progression was also introduced and progression will be customized based on patient's performance and goals.  Dolora has attended the program in the past                Exercise Goals and Review:   Exercise Goals     [place]aser Name 08/05/20 0907             Exercise Goals   Increase Physical Activity Yes       Intervention Provide advice, education, support and counseling about physical activity/exercise needs.;Develop an individualized exercise prescription for aerobic and resistive training based on initial evaluation findings, risk  stratification, comorbidities and  participant's personal goals.       Expected Outcomes Short Term: Attend rehab on a regular basis to increase amount of physical activity.;Long Term: Add in home exercise to make exercise part of routine and to increase amount of physical activity.;Long Term: Exercising regularly at least 3-5 days a week.       Increase Strength and Stamina Yes       Intervention Provide advice, education, support and counseling about physical activity/exercise needs.;Develop an individualized exercise prescription for aerobic and resistive training based on initial evaluation findings, risk stratification, comorbidities and participant's personal goals.       Expected Outcomes Short Term: Increase workloads from initial exercise prescription for resistance, speed, and METs.;Short Term: Perform resistance training exercises routinely during rehab and add in resistance training at home;Long Term: Improve cardiorespiratory fitness, muscular endurance and strength as measured by increased METs and functional capacity (6MWT)       Able to understand and use rate of perceived exertion (RPE) scale Yes       Intervention Provide education and explanation on how to use RPE scale       Expected Outcomes Short Term: Able to use RPE daily in rehab to express subjective intensity level;Long Term:  Able to use RPE to guide intensity level when exercising independently       Able to understand and use Dyspnea scale Yes       Intervention Provide education and explanation on how to use Dyspnea scale       Expected Outcomes Short Term: Able to use Dyspnea scale daily in rehab to express subjective sense of shortness of breath during exertion;Long Term: Able to use Dyspnea scale to guide intensity level when exercising independently       Knowledge and understanding of Target Heart Rate Range (THRR) Yes       Intervention Provide education and explanation of THRR including how the numbers were predicted  and where they are located for reference       Expected Outcomes Short Term: Able to use daily as guideline for intensity in rehab;Short Term: Able to state/look up THRR;Long Term: Able to use THRR to govern intensity when exercising independently       Able to check pulse independently Yes       Intervention Provide education and demonstration on how to check pulse in carotid and radial arteries.;Review the importance of being able to check your own pulse for safety during independent exercise       Expected Outcomes Short Term: Able to explain why pulse checking is important during independent exercise;Long Term: Able to check pulse independently and accurately       Understanding of Exercise Prescription Yes       Intervention Provide education, explanation, and written materials on patient's individual exercise prescription       Expected Outcomes Short Term: Able to explain program exercise prescription;Long Term: Able to explain home exercise prescription to exercise independently                Exercise Goals Re-Evaluation :  Exercise Goals Re-Evaluation     Row Name 08/18/20 0857 09/02/20 1427           Exercise Goal Re-Evaluation   Exercise Goals Review Knowledge and understanding of Target Heart Rate Range (THRR);Able to understand and use rate of perceived exertion (RPE) scale;Able to understand and use Dyspnea scale;Able to check pulse independently;Understanding of Exercise Prescription Increase Physical Activity;Increase Strength and Stamina;Understanding of Exercise Prescription  Comments Reviewed RPE and dyspnea scales, THR and program prescription with pt today.  Pt voiced understanding and was given a copy of goals to take home. Bulah has missed her last two appointments.  She is up to 22 laps on the track.  We will encourage improved attendance and continue to monitor her progress.      Expected Outcomes Short: Use RPE daily to regulate intensity. Long: Follow program  prescription in THR. Short: Improved attendance again Long: Continue to improve stamina               Discharge Exercise Prescription (Final Exercise Prescription Changes):  Exercise Prescription Changes - 09/02/20 1400       Response to Exercise   Blood Pressure (Admit) 120/68    Blood Pressure (Exercise) 162/88    Blood Pressure (Exit) 112/74    Heart Rate (Admit) 77 bpm    Heart Rate (Exercise) 126 bpm    Heart Rate (Exit) 77 bpm    Rating of Perceived Exertion (Exercise) 13    Duration Continue with 30 min of aerobic exercise without signs/symptoms of physical distress.    Intensity THRR unchanged      Progression   Progression Continue to progress workloads to maintain intensity without signs/symptoms of physical distress.    Average METs 2.85      Resistance Training   Training Prescription Yes    Weight 3 lb    Reps 10-15      Interval Training   Interval Training No      NuStep   Level 2    Minutes 15    METs 3.5      Track   Laps 22    Minutes 15    METs 2.2             Nutrition:  Target Goals: Understanding of nutrition guidelines, daily intake of sodium 1500mg , cholesterol 200mg , calories 30% from fat and 7% or less from saturated fats, daily to have 5 or more servings of fruits and vegetables.  Education: All About Nutrition: -Group instruction provided by verbal, written material, interactive activities, discussions, models, and posters to present general guidelines for heart healthy nutrition including fat, fiber, MyPlate, the role of sodium in heart healthy nutrition, utilization of the nutrition label, and utilization of this knowledge for meal planning. Follow up email sent as well. Written material given at graduation. Flowsheet Row Cardiac Rehab from 09/10/2020 in [facility] Cardiac and Pulmonary Rehab  Education need identified 08/05/20       Biometrics:  Pre Biometrics - 08/05/20 0908       Pre Biometrics   Height 5' 2.1" (1.577 m)     Weight 166 lb (75.3 kg)    BMI (Calculated) 30.28    Single Leg Stand 0.8 seconds              Nutrition Therapy Plan and Nutrition Goals:  Nutrition Therapy & Goals - 08/20/20 1531       Nutrition Therapy   Diet Heart healthy, low Na, diabetes friendly eating.    Drug/Food Interactions Statins/Certain Fruits    Protein (specify units) 60g    Fiber 25 grams    Whole Grain Foods 3 servings    Saturated Fats 12 max. grams    Sodium 1.5 grams      Personal Nutrition Goals   Nutrition Goal ST: choose more non- starchy vegetables at meals, rinse off canned food with water and cloose low sodium or no salt added  LT: balance meals using MyPlate method. limit salt intake to <1.5g/day    Comments B: yogurt with banana with water and medications S: walnuts or peanut butter or cheese crackers or glucerna L: salad and bottle of water and handful of walnuts or glucerna D:baked chicken, corn, cabbage, and dinner roll (did not finish). Meat (fish or shrimp, chicken, ground meat 1x/week, pork chop - typically meat is baked or missed in with other foods) and vegetable and CHO (mashed potatoes, corn, dinner rolls).  She mentioned she only has hot dogs 1x/month. She does not like collards or mixed green or eggplant or brussells sprouts. She eats lots of fruit. Discussed heart healhty eating and diabetes friendly eating.      Intervention Plan   Intervention Prescribe, educate and counsel regarding individualized specific dietary modifications aiming towards targeted core components such as weight, hypertension, lipid management, diabetes, heart failure and other comorbidities.    Expected Outcomes Short Term Goal: Understand basic principles of dietary content, such as calories, fat, sodium, cholesterol and nutrients.;Short Term Goal: A plan has been developed with personal nutrition goals set during dietitian appointment.;Long Term Goal: Adherence to prescribed nutrition plan.              Nutrition Assessments:  MEDIFICTS Score Key: ?70 Need to make dietary changes  40-70 Heart Healthy Diet ? 40 Therapeutic Level Cholesterol Diet  Flowsheet Row Cardiac Rehab from 08/05/2020 in [facility] Cardiac and Pulmonary Rehab  Picture Your Plate Total Score on Admission 65      Picture Your Plate Scores: <16 Unhealthy dietary pattern with much room for improvement. 41-50 Dietary pattern unlikely to meet recommendations for good health and room for improvement. 51-60 More healthful dietary pattern, with some room for improvement.  >60 Healthy dietary pattern, although there may be some specific behaviors that could be improved.    Nutrition Goals Re-Evaluation:   Nutrition Goals Discharge (Final Nutrition Goals Re-Evaluation):   Psychosocial: Target Goals: Acknowledge presence or absence of significant depression and/or stress, maximize coping skills, provide positive support system. Participant is able to verbalize types and ability to use techniques and skills needed for reducing stress and depression.   Education: Stress, Anxiety, and Depression - Group verbal and visual presentation to define topics covered.  Reviews how body is impacted by stress, anxiety, and depression.  Also discusses healthy ways to reduce stress and to treat/manage anxiety and depression.  Written material given at graduation. Flowsheet Row Cardiac Rehab from 02/08/2018 in [facility] Cardiac and Pulmonary Rehab  Date 12/28/17  Educator [facility]  Instruction Review Code 5- Refused Teaching       Education: Sleep Hygiene -Provides group verbal and written instruction about how sleep can affect your health.  Define sleep hygiene, discuss sleep cycles and impact of sleep habits. Review good sleep hygiene tips.  Flowsheet Row Cardiac Rehab from 02/08/2018 in [facility] Cardiac and Pulmonary Rehab  Date 02/08/18  Educator [facility]  Instruction Review Code 1- Verbalizes Understanding       Initial Review & Psychosocial  Screening:  Initial Psych Review & Screening - 08/01/20 1520       Initial Review   Current issues with History of Depression;Current Sleep Concerns;Current Stress Concerns    Source of Stress Concerns Chronic Illness;Unable to perform yard/household activities;Unable to participate in former interests or hobbies      [place]? Yes      Barriers   Psychosocial barriers to participate in program The patient should  benefit from training in stress management and relaxation.;Psychosocial barriers identified (see note)      Screening Interventions   Interventions Encouraged to exercise;Provide feedback about the scores to participant;To provide support and resources with identified psychosocial needs    Expected Outcomes Short Term goal: Utilizing psychosocial counselor, staff and physician to assist with identification of specific Stressors or current issues interfering with healing process. Setting desired goal for each stressor or current issue identified.;Long Term Goal: Stressors or current issues are controlled or eliminated.;Long Term goal: The participant improves quality of Life and PHQ9 Scores as seen by post scores and/or verbalization of changes;Short Term goal: Identification and review with participant of any Quality of Life or Depression concerns found by scoring the questionnaire.             Quality of Life Scores:   Quality of Life - 08/05/20 0908       Quality of Life   Select Quality of Life      Quality of Life Scores   Health/Function Pre 17.32 %    Socioeconomic Pre 18.21 %    Psych/Spiritual Pre 23.79 %    Family Pre 29.35 %    GLOBAL Pre 20.44 %            Scores of 19 and below usually indicate a poorer quality of life in these areas.  A difference of  2-3 points is a clinically meaningful difference.  A difference of 2-3 points in the total score of the Quality of Life Index has been associated with significant improvement  in overall quality of life, self-image, physical symptoms, and general health in studies assessing change in quality of life.  PHQ-9: Recent Review Flowsheet Data     Depression screen [facility] 2/9 08/05/2020 04/15/2020 04/02/2020 02/06/2020 07/12/2019   Decreased Interest 1 0 0 1 1   Down, Depressed, Hopeless 1 0 0 0 0   PHQ - 2 Score 2 0 0 1 1   Altered sleeping 1 - - 1 1   Tired, decreased energy 1 - - 1 1   Change in appetite 1 - - 1 0   Feeling bad or failure about yourself  0 - - 0 0   Trouble concentrating 0 - - 0 0   Moving slowly or fidgety/restless 0 - - 0 0   Suicidal thoughts 0 - - 0 0   PHQ-9 Score 5 - - 4 3   Difficult doing work/chores Not difficult at all - - Not difficult at all Not difficult at all      Interpretation of Total Score  Total Score Depression Severity:  1-4 = Minimal depression, 5-9 = Mild depression, 10-14 = Moderate depression, 15-19 = Moderately severe depression, 20-27 = Severe depression   Psychosocial Evaluation and Intervention:  Psychosocial Evaluation - 08/01/20 1520       Psychosocial Evaluation & Interventions   Interventions Stress management education;Relaxation education;Encouraged to exercise with the program and follow exercise prescription    Comments Kmya is returning to the program after completing it in 2019. She has had a NSTEMI this time. She states that this last year has been filled with medical issues and she is finally starting to feel better. She just started back driving and is looking forward to picking up her grandkids from school. Her family is her support system and she cherishes them. She doesn't report any symptoms of depression today and states she is only focused on seeing the positive side of  things. She is excited to come back to the program and see some familiar faces. She is really wanting to maintain her independence and she knows this is the best way to do that    Expected Outcomes Short: attend cardiac rehab for education  and exercise. Long: Develop positive self care habits.    Continue Psychosocial Services  Follow up required by staff             Psychosocial Re-Evaluation:   Psychosocial Discharge (Final Psychosocial Re-Evaluation):   Vocational Rehabilitation: Provide vocational rehab assistance to qualifying candidates.   Vocational Rehab Evaluation & Intervention:  Vocational Rehab - 08/01/20 1520       Initial Vocational Rehab Evaluation & Intervention   Assessment shows need for Vocational Rehabilitation No             Education: Education Goals: Education classes will be provided on a variety of topics geared toward better understanding of heart health and risk factor modification. Participant will state understanding/return demonstration of topics presented as noted by education test scores.  Learning Barriers/Preferences:  Learning Barriers/Preferences - 08/01/20 1519       Learning Barriers/Preferences   Learning Barriers None    Learning Preferences None             General Cardiac Education Topics:  AED/CPR: - Group verbal and written instruction with the use of models to demonstrate the basic use of the AED with the basic ABC's of resuscitation. Flowsheet Row Cardiac Rehab from 02/08/2018 in [facility] Cardiac and Pulmonary Rehab  Date 01/26/18  Educator MA  Instruction Review Code 1- Verbalizes Understanding       Anatomy and Cardiac Procedures: - Group verbal and visual presentation and models provide information about basic cardiac anatomy and function. Reviews the testing methods done to diagnose heart disease and the outcomes of the test results. Describes the treatment choices: Medical Management, Angioplasty, or Coronary Bypass Surgery for treating various heart conditions including Myocardial Infarction, Angina, Valve Disease, and Cardiac Arrhythmias.  Written material given at graduation. Flowsheet Row Cardiac Rehab from 09/10/2020 in [facility] Cardiac and  Pulmonary Rehab  Education need identified 08/05/20       Medication Safety: - Group verbal and visual instruction to review commonly prescribed medications for heart and lung disease. Reviews the medication, class of the drug, and side effects. Includes the steps to properly store meds and maintain the prescription regimen.  Written material given at graduation. Flowsheet Row Cardiac Rehab from 02/08/2018 in [facility] Cardiac and Pulmonary Rehab  Date 01/17/18  Educator SB  Instruction Review Code 1- Verbalizes Understanding       Intimacy: - Group verbal instruction through game format to discuss how heart and lung disease can affect sexual intimacy. Written material given at graduation.. Flowsheet Row Cardiac Rehab from 02/08/2018 in [facility] Cardiac and Pulmonary Rehab  Date 12/20/17  Educator SB  Instruction Review Code 1- Verbalizes Understanding       Know Your Numbers and Heart Failure: - Group verbal and visual instruction to discuss disease risk factors for cardiac and pulmonary disease and treatment options.  Reviews associated critical values for Overweight/Obesity, Hypertension, Cholesterol, and Diabetes.  Discusses basics of heart failure: signs/symptoms and treatments.  Introduces Heart Failure Zone chart for action plan for heart failure.  Written material given at graduation. Flowsheet Row Cardiac Rehab from 09/10/2020 in [facility] Cardiac and Pulmonary Rehab  Date 08/20/20  Educator [facility]  Instruction Review Code 1- Verbalizes Understanding  Infection Prevention: - Provides verbal and written material to individual with discussion of infection control including proper hand washing and proper equipment cleaning during exercise session. Flowsheet Row Cardiac Rehab from 09/10/2020 in [facility] Cardiac and Pulmonary Rehab  Date 08/05/20  Educator [facility]  Instruction Review Code 1- Verbalizes Understanding       Falls Prevention: - Provides verbal and written material to  individual with discussion of falls prevention and safety. Flowsheet Row Cardiac Rehab from 09/10/2020 in [facility] Cardiac and Pulmonary Rehab  Date 08/05/20  Educator [facility]  Instruction Review Code 1- Verbalizes Understanding       Other: -Provides group and verbal instruction on various topics (see comments)   Knowledge Questionnaire Score:  Knowledge Questionnaire Score - 08/05/20 0910       Knowledge Questionnaire Score   Pre Score 22/26 Education Focus: Angina, Nutrition, Exercise             Core Components/Risk Factors/Patient Goals at Admission:  Personal Goals and Risk Factors at Admission - 08/05/20 0910       Core Components/Risk Factors/Patient Goals on Admission    Weight Management Yes;Obesity;Weight Loss    Intervention Weight Management: Develop a combined nutrition and exercise program designed to reach desired caloric intake, while maintaining appropriate intake of nutrient and fiber, sodium and fats, and appropriate energy expenditure required for the weight goal.;Weight Management: Provide education and appropriate resources to help participant work on and attain dietary goals.;Weight Management/Obesity: Establish reasonable short term and long term weight goals.;Obesity: Provide education and appropriate resources to help participant work on and attain dietary goals.    Admit Weight 166 lb (75.3 kg)    Goal Weight: Short Term 160 lb (72.6 kg)    Goal Weight: Long Term 155 lb (70.3 kg)    Expected Outcomes Short Term: Continue to assess and modify interventions until short term weight is achieved;Long Term: Adherence to nutrition and physical activity/exercise program aimed toward attainment of established weight goal;Weight Loss: Understanding of general recommendations for a balanced deficit meal plan, which promotes 1-2 lb weight loss per week and includes a negative energy balance of [phone] kcal/d;Understanding recommendations for meals to include 15-35% energy  as protein, 25-35% energy from fat, 35-60% energy from carbohydrates, less than 200mg  of dietary cholesterol, 20-35 gm of total fiber daily;Understanding of distribution of calorie intake throughout the day with the consumption of 4-5 meals/snacks    Diabetes Yes    Intervention Provide education about signs/symptoms and action to take for hypo/hyperglycemia.;Provide education about proper nutrition, including hydration, and aerobic/resistive exercise prescription along with prescribed medications to achieve blood glucose in normal ranges: Fasting glucose 65-99 mg/dL    Expected Outcomes Short Term: Participant verbalizes understanding of the signs/symptoms and immediate care of hyper/hypoglycemia, proper foot care and importance of medication, aerobic/resistive exercise and nutrition plan for blood glucose control.;Long Term: Attainment of HbA1C < 7%.    Heart Failure Yes    Intervention Provide a combined exercise and nutrition program that is supplemented with education, support and counseling about heart failure. Directed toward relieving symptoms such as shortness of breath, decreased exercise tolerance, and extremity edema.    Expected Outcomes Improve functional capacity of life;Short term: Attendance in program 2-3 days a week with increased exercise capacity. Reported lower sodium intake. Reported increased fruit and vegetable intake. Reports medication compliance.;Short term: Daily weights obtained and reported for increase. Utilizing diuretic protocols set by physician.;Long term: Adoption of self-care skills and reduction of barriers for early signs and  symptoms recognition and intervention leading to self-care maintenance.    Hypertension Yes    Intervention Provide education on lifestyle modifcations including regular physical activity/exercise, weight management, moderate sodium restriction and increased consumption of fresh fruit, vegetables, and low fat dairy, alcohol moderation, and smoking  cessation.;Monitor prescription use compliance.    Expected Outcomes Short Term: Continued assessment and intervention until BP is < 140/27mm HG in hypertensive participants. < 130/52mm HG in hypertensive participants with diabetes, heart failure or chronic kidney disease.;Long Term: Maintenance of blood pressure at goal levels.    Lipids Yes    Intervention Provide education and support for participant on nutrition & aerobic/resistive exercise along with prescribed medications to achieve LDL 70mg , HDL >40mg .    Expected Outcomes Short Term: Participant states understanding of desired cholesterol values and is compliant with medications prescribed. Participant is following exercise prescription and nutrition guidelines.;Long Term: Cholesterol controlled with medications as prescribed, with individualized exercise RX and with personalized nutrition plan. Value goals: LDL < 70mg , HDL > 40 mg.             Education:Diabetes - Individual verbal and written instruction to review signs/symptoms of diabetes, desired ranges of glucose level fasting, after meals and with exercise. Acknowledge that pre and post exercise glucose checks will be done for 3 sessions at entry of program. [place] from 09/10/2020 in [facility] Cardiac and Pulmonary Rehab  Date 08/01/20  Educator [facility]  Instruction Review Code 1- Verbalizes Understanding       Core Components/Risk Factors/Patient Goals Review:    Core Components/Risk Factors/Patient Goals at Discharge (Final Review):    ITP Comments:  ITP Comments     Row Name 08/01/20 1507 08/05/20 0903 08/13/20 0759 08/18/20 0857 08/20/20 1530   ITP Comments Initial telephone orientation completed. Diagnosis can be found in [facility] 4/13. EP orientation scheduled for Tuesday 5/10 at 8am. Completed 6MWT and gym orientation. Initial ITP created and sent for review to Dr. Emily Filbert, Medical Director. 30 Day review completed. Medical Director ITP review done,  changes made as directed, and signed approval by Medical Director.   New to program First full day of exercise!  Patient was oriented to gym and equipment including functions, settings, policies, and procedures.  Patient's individual exercise prescription and treatment plan were reviewed.  All starting workloads were established based on the results of the 6 minute walk test done at initial orientation visit.  The plan for exercise progression was also introduced and progression will be customized based on patient's performance and goals.  Frida has attended the program in the past Completed initial RD evaluation    Row Name 09/08/20 1628 09/10/20 0908         ITP Comments Unable to complete [place]'s goals this review. Only attended 4 times since starting in May. 30 Day review completed. Medical Director ITP review done, changes made as directed, and signed approval by Medical Director.               Comments:

## 2020-09-12 ENCOUNTER — Encounter: Payer: Medicare Other | Admitting: *Deleted

## 2020-09-12 ENCOUNTER — Other Ambulatory Visit: Payer: Self-pay

## 2020-09-12 DIAGNOSIS — I214 Non-ST elevation (NSTEMI) myocardial infarction: Secondary | ICD-10-CM

## 2020-09-15 ENCOUNTER — Other Ambulatory Visit: Payer: Self-pay

## 2020-09-15 ENCOUNTER — Encounter: Payer: Medicare Other | Admitting: *Deleted

## 2020-09-15 DIAGNOSIS — I214 Non-ST elevation (NSTEMI) myocardial infarction: Secondary | ICD-10-CM | POA: Diagnosis not present

## 2020-09-15 NOTE — Progress Notes (Signed)
Daily Session Note  Patient Details  Name: Candace [name] MRN: [id number] Date of Birth: 27-Jan-1951 Referring Provider:   Flowsheet Row Cardiac Rehab from 08/05/2020 in [facility] Cardiac and Pulmonary Rehab  Referring Provider [name]       Encounter Date: 09/12/2020  Check In:      Social History   Tobacco Use  Smoking Status Former   Packs/day: 0.50   Pack years: 0.00   Types: Cigarettes   Quit date: 10/07/2001   Years since quitting: 18.9  Smokeless Tobacco Never    Goals Met:  Independence with exercise equipment Personal goals reviewed Strength training completed today  Goals Unmet:  Not Applicable  Comments: Pt able to follow exercise prescription today without complaint.  Will continue to monitor for progression.    Dr. Emily Filbert is Medical Director for [place].  Dr. Ottie Glazier is Medical Director for [facility] Pulmonary Rehabilitation.

## 2020-09-15 NOTE — Progress Notes (Signed)
Daily Session Note  Patient Details  Name: Candace [name] MRN: [id number] Date of Birth: 03/09/1951 Referring Provider:   Flowsheet Row Cardiac Rehab from 08/05/2020 in [facility] Cardiac and Pulmonary Rehab  Referring Provider [name]       Encounter Date: 09/15/2020  Check In:  Session Check In - 09/15/20 0834       Check-In   Supervising physician immediately available to respond to emergencies See telemetry face sheet for immediately available ER MD    Location [facility]-Cardiac & Pulmonary Rehab    Staff Present Heath Lark, RN, BSN, CCRP;Joseph Hood RCP,RRT,BSRT;Kelly [place], [place], ACSM CEP, Exercise Physiologist    Virtual Visit No    Medication changes reported     No    Fall or balance concerns reported    No    Warm-up and Cool-down Performed on first and last piece of equipment    Resistance Training Performed Yes    VAD Patient? No    PAD/SET Patient? No      Pain Assessment   Currently in Pain? No/denies                Social History   Tobacco Use  Smoking Status Former   Packs/day: 0.50   Pack years: 0.00   Types: Cigarettes   Quit date: 10/07/2001   Years since quitting: 18.9  Smokeless Tobacco Never    Goals Met:  Independence with exercise equipment Exercise tolerated well No report of cardiac concerns or symptoms  Goals Unmet:  Not Applicable  Comments: Pt able to follow exercise prescription today without complaint.  Will continue to monitor for progression.    Dr. Emily Filbert is Medical Director for [place].  Dr. Ottie Glazier is Medical Director for [facility] Pulmonary Rehabilitation.

## 2020-09-17 ENCOUNTER — Other Ambulatory Visit: Payer: Self-pay

## 2020-09-17 DIAGNOSIS — I214 Non-ST elevation (NSTEMI) myocardial infarction: Secondary | ICD-10-CM

## 2020-09-17 NOTE — Progress Notes (Signed)
Daily Session Note  Patient Details  Name: Candace [name] MRN: [id number] Date of Birth: 08/23/1950 Referring Provider:   Flowsheet Row Cardiac Rehab from 08/05/2020 in [facility] Cardiac and Pulmonary Rehab  Referring Provider [name]       Encounter Date: 09/17/2020  Check In:  Session Check In - 09/17/20 0800       Check-In   Supervising physician immediately available to respond to emergencies See telemetry face sheet for immediately available ER MD    Location [facility]-Cardiac & Pulmonary Rehab    Staff Present Birdie Sons, MPA, Elveria Rising, BA, ACSM CEP, Exercise Physiologist;Joseph Tessie Fass RCP,RRT,BSRT    Virtual Visit No    Medication changes reported     No    Fall or balance concerns reported    No    Warm-up and Cool-down Performed on first and last piece of equipment    Resistance Training Performed Yes    VAD Patient? No    PAD/SET Patient? No      Pain Assessment   Currently in Pain? No/denies                Social History   Tobacco Use  Smoking Status Former   Packs/day: 0.50   Pack years: 0.00   Types: Cigarettes   Quit date: 10/07/2001   Years since quitting: 18.9  Smokeless Tobacco Never    Goals Met:  Independence with exercise equipment Exercise tolerated well No report of cardiac concerns or symptoms Strength training completed today  Goals Unmet:  Not Applicable  Comments: Pt able to follow exercise prescription today without complaint.  Will continue to monitor for progression.    Dr. Emily Filbert is Medical Director for [place].  Dr. Ottie Glazier is Medical Director for [facility] Pulmonary Rehabilitation.

## 2020-09-19 ENCOUNTER — Other Ambulatory Visit: Payer: Self-pay

## 2020-09-19 ENCOUNTER — Encounter: Payer: Medicare Other | Admitting: *Deleted

## 2020-09-19 DIAGNOSIS — I214 Non-ST elevation (NSTEMI) myocardial infarction: Secondary | ICD-10-CM

## 2020-09-19 NOTE — Progress Notes (Signed)
Daily Session Note  Patient Details  Name: Candace [name] MRN: [id number] Date of Birth: [birth year] Referring Provider:   Flowsheet Row Cardiac Rehab from 08/05/2020 in [facility] Cardiac and Pulmonary Rehab  Referring Provider [name]       Encounter Date: 09/19/2020  Check In:  Session Check In - 09/19/20 0834       Check-In   Supervising physician immediately available to respond to emergencies See telemetry face sheet for immediately available ER MD    Location [facility]-Cardiac & Pulmonary Rehab    Staff Present Heath Lark, RN, BSN, CCRP;Jessica [place], MA, RCEP, CCRP, CCET;Joseph [place] RCP,RRT,BSRT    Virtual Visit No    Medication changes reported     No    Fall or balance concerns reported    No    Warm-up and Cool-down Performed on first and last piece of equipment    Resistance Training Performed Yes    VAD Patient? No    PAD/SET Patient? No      Pain Assessment   Currently in Pain? No/denies                Social History   Tobacco Use  Smoking Status Former   Packs/day: 0.50   Pack years: 0.00   Types: Cigarettes   Quit date: 10/07/2001   Years since quitting: 18.9  Smokeless Tobacco Never    Goals Met:  Independence with exercise equipment Exercise tolerated well No report of cardiac concerns or symptoms  Goals Unmet:  Not Applicable  Comments: Pt able to follow exercise prescription today without complaint.  Will continue to monitor for progression.    Dr. Emily Filbert is Medical Director for [name].  Dr. Ottie Glazier is Medical Director for [facility] Pulmonary Rehabilitation.

## 2020-09-22 NOTE — Progress Notes (Signed)
Reviewed chart for medication changes ahead of medication coordination call.   BP Readings from Last 3 Encounters:  08/11/20 108/76  06/18/20 (!) 101/49  05/13/20 (!) 166/95    Lab Results  Component Value Date   HGBA1C 9.1 (H) 08/11/2020     Patient obtains medications through Adherence Packaging  30 Days   Last adherence delivery included:  Trulicity 1.5 mg inject 1.5 mg once a week Isosorbide mononitrate 30 mg one tablet daily - Breakfast Pantoprazole 20 mg one tablet two times a day- breakfast, Evening meals Losartan 25 mg one tablet daily- Bedtime Eliquis 5 mg 1 tablet two times daily breakfast, evening meals   Patient declined Medications last month Jardiance 25 mg 1 tablet daily before breakfast- (receive 80 Day supply on 06/30/2020) Rosuvastatin 40 mg 1 tablet daily breakfast-(receive 90 day supply from [facility] on 07/13/2020) ezetimibe 10 mg 1 tablet daily breakfast - (receive 90 day supply from [place] on 07/13/2020) Clopidogrel 75 mg 1 tablet daily breakfast (receive 90 day supply from [facility] on 07/13/2020) Metoprolol 100 mg 1 tablet two times daily breakfast, evening meals-(receive 30 day supply from [facility] on 08/06/2020) Sertraline 50 mg 1 tablet daily breakfast - (receive 90 day supply from [place] on 08/08/2020) Ondansetron 8 mg PRN Nitroglycerin 0.4 SL PRN  Patient is due for next adherence delivery on: 09/19/2020.. Called patient and reviewed medications and coordinated delivery.  I have attempted without success to contact this patient by phone three times to do her Medication Coordination call. I left a Voice message for patient to return my call.  [place] Pharmacist Assistant [phone]

## 2020-09-24 ENCOUNTER — Emergency Department: Payer: Medicare Other

## 2020-09-24 ENCOUNTER — Other Ambulatory Visit: Payer: Self-pay

## 2020-09-24 ENCOUNTER — Emergency Department
Admission: EM | Admit: 2020-09-24 | Discharge: 2020-09-24 | Disposition: A | Discharge status: Home / Self Care | Payer: Medicare Other | Source: Home / Self Care | Attending: Emergency Medicine | Admitting: Emergency Medicine

## 2020-09-24 DIAGNOSIS — I110 Hypertensive heart disease with heart failure: Secondary | ICD-10-CM | POA: Insufficient documentation

## 2020-09-24 DIAGNOSIS — Z951 Presence of aortocoronary bypass graft: Secondary | ICD-10-CM | POA: Insufficient documentation

## 2020-09-24 DIAGNOSIS — R519 Headache, unspecified: Secondary | ICD-10-CM | POA: Insufficient documentation

## 2020-09-24 DIAGNOSIS — Z7984 Long term (current) use of oral hypoglycemic drugs: Secondary | ICD-10-CM | POA: Insufficient documentation

## 2020-09-24 DIAGNOSIS — Z87891 Personal history of nicotine dependence: Secondary | ICD-10-CM | POA: Insufficient documentation

## 2020-09-24 DIAGNOSIS — R110 Nausea: Secondary | ICD-10-CM | POA: Insufficient documentation

## 2020-09-24 DIAGNOSIS — R079 Chest pain, unspecified: Secondary | ICD-10-CM | POA: Insufficient documentation

## 2020-09-24 DIAGNOSIS — I5032 Chronic diastolic (congestive) heart failure: Secondary | ICD-10-CM | POA: Insufficient documentation

## 2020-09-24 DIAGNOSIS — Z7901 Long term (current) use of anticoagulants: Secondary | ICD-10-CM | POA: Insufficient documentation

## 2020-09-24 DIAGNOSIS — I2510 Atherosclerotic heart disease of native coronary artery without angina pectoris: Secondary | ICD-10-CM | POA: Insufficient documentation

## 2020-09-24 DIAGNOSIS — I214 Non-ST elevation (NSTEMI) myocardial infarction: Secondary | ICD-10-CM | POA: Diagnosis not present

## 2020-09-24 DIAGNOSIS — J449 Chronic obstructive pulmonary disease, unspecified: Secondary | ICD-10-CM | POA: Insufficient documentation

## 2020-09-24 DIAGNOSIS — I480 Paroxysmal atrial fibrillation: Secondary | ICD-10-CM | POA: Insufficient documentation

## 2020-09-24 DIAGNOSIS — E119 Type 2 diabetes mellitus without complications: Secondary | ICD-10-CM | POA: Insufficient documentation

## 2020-09-24 DIAGNOSIS — Z79899 Other long term (current) drug therapy: Secondary | ICD-10-CM | POA: Insufficient documentation

## 2020-09-24 LAB — CBC
HCT: 40 % (ref 36.0–46.0)
Hemoglobin: 13.7 g/dL (ref 12.0–15.0)
MCH: 32.8 pg (ref 26.0–34.0)
MCHC: 34.3 g/dL (ref 30.0–36.0)
MCV: 95.7 fL (ref 80.0–100.0)
Platelets: 197 10*3/uL (ref 150–400)
RBC: 4.18 MIL/uL (ref 3.87–5.11)
RDW: 12.4 % (ref 11.5–15.5)
WBC: 6.4 10*3/uL (ref 4.0–10.5)
nRBC: 0 % (ref 0.0–0.2)

## 2020-09-24 LAB — TROPONIN I (HIGH SENSITIVITY)
Troponin I (High Sensitivity): 8 ng/L (ref <18)
Troponin I (High Sensitivity): 9 ng/L (ref <18)

## 2020-09-24 LAB — BASIC METABOLIC PANEL
Anion gap: 12 (ref 5–15)
BUN: 12 mg/dL (ref 8–23)
CO2: 24 mmol/L (ref 22–32)
Calcium: 9.1 mg/dL (ref 8.9–10.3)
Chloride: 103 mmol/L (ref 98–111)
Creatinine, Ser: 0.72 mg/dL (ref 0.44–1.00)
GFR, Estimated: >60 mL/min (ref >60)
Glucose, Bld: 238 mg/dL — ABNORMAL HIGH (ref 70–99)
Potassium: 3.8 mmol/L (ref 3.5–5.1)
Sodium: 139 mmol/L (ref 135–145)

## 2020-09-24 NOTE — Progress Notes (Signed)
Daily Session Note  Patient Details  Name: Candace [name] MRN: [id number] Date of Birth: Apr 02, 1950 Referring Provider:   Flowsheet Row Cardiac Rehab from 08/05/2020 in [facility] Cardiac and Pulmonary Rehab  Referring Provider [name]       Encounter Date: 09/24/2020  Check In:  Session Check In - 09/24/20 0759       Check-In   Supervising physician immediately available to respond to emergencies See telemetry face sheet for immediately available ER MD    Location [facility]-Cardiac & Pulmonary Rehab    Staff Present Birdie Sons, MPA, Nino Glow, MS, ASCM CEP, Exercise Physiologist;Kristen Coble, RN,BC,MSN;Joseph [place], [place]    Virtual Visit No    Medication changes reported     No    Fall or balance concerns reported    No    Warm-up and Cool-down Performed on first and last piece of equipment    Resistance Training Performed Yes    VAD Patient? No    PAD/SET Patient? No      Pain Assessment   Currently in Pain? No/denies                Social History   Tobacco Use  Smoking Status Former   Packs/day: 0.50   Pack years: 0.00   Types: Cigarettes   Quit date: 10/07/2001   Years since quitting: 18.9  Smokeless Tobacco Never    Goals Met:  Independence with exercise equipment Queuing for purse lip breathing  Goals Unmet:  Not Applicable  Comments: Pt completed 20 laps on the track and then needed to rest. Pt unable to continue exercising today due to 7/10 tightness in chest/chest pain. Pt BP checked, 122/76, and advised to take nitro per her prescription. Pt BP recheck at 92/70 with headache, nausea, and chest pain increasing per patient (pain described as stabbing and sharp at times, pressure, and discomfort). Pt was taken to emergency room by Cyril Mourning, RN, for further evaluation.    Dr. Emily Filbert is Medical Director for [place].  Dr. Ottie Glazier is Medical Director for [facility] Pulmonary Rehabilitation.

## 2020-09-24 NOTE — ED Provider Notes (Signed)
[facility] Emergency Department Provider Note  Time seen: 9:42 AM  I have reviewed the triage vital signs and the nursing notes.   HISTORY  Chief Complaint Chest Pain  HPI Candace [name] is a 70 y.o. female with a past medical history of anemia, anxiety, arthritis, CAD, diabetes, hypertension, hyperlipidemia, multiple MIs including most recently in April who presents from cardiac rehab for chest pain.  According to the patient and reviewing the rehab notes patient was walking when she began experiencing 7/10 tightness in her chest.  Patient took a nitroglycerin but continued to have headache nausea chest pain so they brought her to the emergency department.  Here patient states that chest pain is gone but she continues to feel a mild "tightness" across the chest.  Denies any current shortness of breath nausea or diaphoresis.  Patient follows up with Dr. Nehemiah Massed of cardiology.  Past Medical History:  Diagnosis Date   Allergy    Anemia    Anxiety    Arrhythmia    Arthritis    Atrial fibrillation ([place])    Coronary artery disease    Depression    Diabetes mellitus without complication ([place])    Dyspnea    doe   Dysrhythmia    GERD (gastroesophageal reflux disease)    Headache    History of hiatal hernia    Hyperlipidemia    Hypertension    Myocardial infarction ([place])    2016, 04/2017   Myocardial infarction with cardiac rehabilitation [facility])    MI 2016/ CABG 8/17    FINISHED CARDIAC REHAB 3 WEEKS AGO   Panic attack    Pneumonia    Reflux    Stroke ([place]) 2015   showed up on MRI; no weakness noted   TIA (transient ischemic attack)    Voice tremor     Patient Active Problem List   Diagnosis Date Noted   Vertigo    Unsteady gait    Acute bronchitis 03/07/2019   Esophageal dysphagia    Stomach irritation    Gastric polyp    Esophageal lump    Hx of colonic polyp    Polyp of colon    Diverticulosis of large intestine without diverticulitis     PSVT (paroxysmal supraventricular tachycardia) ([place]) 10/03/2018   Abnormal ECG 07/05/2018   Tachycardia 03/27/2018   Pain in limb 02/28/2018   Chronic diastolic CHF (congestive heart failure) ([place]) 11/03/2017   TIA (transient ischemic attack) 11/03/2017   COPD suggested by initial evaluation ([place]) 08/23/2017   Acute and chronic respiratory failure with hypoxia ([place]) 08/23/2017   Postoperative state 08/15/2017   Incidental pulmonary nodule 08/01/2017   Non-ST elevation myocardial infarction (NSTEMI), subendocardial infarction, subsequent episode of care ([place]) 06/01/2017   B12 deficiency 12/01/2016   Vitamin D deficiency 11/04/2016   Depression with anxiety 09/30/2016   AF (paroxysmal atrial fibrillation) ([place]) 09/30/2016   Resting tremor 09/30/2016   Mild obstructive sleep apnea 09/30/2016   Headache 08/18/2016   Unstable angina ([place]) 08/03/2016   Type 2 diabetes mellitus with complication, without long-term current use of insulin ([place]) 08/03/2016   Chronic daily headache 05/05/2016   Asthma 11/11/2015   S/P CABG x 4 11/10/2015   Coronary artery disease 11/07/2015   Carotid artery narrowing 02/08/2014   Chest pain 02/08/2014   Mixed hyperlipidemia 02/08/2014   Bilateral carotid artery stenosis 02/08/2014   Atypical chest pain 02/07/2014   Essential hypertension 02/07/2014   Awareness of heartbeats 02/07/2014   D (diarrhea) 10/05/2013  Gastroesophageal reflux disease 10/05/2013    Past Surgical History:  Procedure Laterality Date   ABDOMINAL HYSTERECTOMY     APPENDECTOMY  1975   ARTERY BIOPSY Right 04/26/2016   Procedure: BIOPSY TEMPORAL ARTERY;  Surgeon: Margaretha Sheffield, MD;  Location: [facility] ORS;  Service: ENT;  Laterality: Right;   CARDIAC CATHETERIZATION N/A 11/06/2015   Procedure: Left Heart Cath and Coronary Angiography;  Surgeon: Corey Skains, MD;  Location: [place] [facility];  Service: Cardiovascular;  Laterality: N/A;   CESAREAN SECTION     COLONOSCOPY  2015    COLONOSCOPY WITH PROPOFOL N/A 11/03/2018   Procedure: COLONOSCOPY WITH PROPOFOL;  Surgeon: Virgel Manifold, MD;  Location: [facility] ENDOSCOPY;  Service: Endoscopy;  Laterality: N/A;   CORONARY ANGIOPLASTY  04/2017   [place]   CORONARY ARTERY BYPASS GRAFT N/A 11/10/2015   Procedure: CORONARY ARTERY BYPASS GRAFTING (CABG), ON PUMP, TIMES FOUR, USING LEFT INTERNAL MAMMARY ARTERY, BILATERAL GREATER SAPHENOUS VEINS HARVESTED ENDOSCOPICALLY;  Surgeon: [name], MD;  Location: [place];  Service: Open Heart Surgery;  Laterality: N/A;  LIMA-LAD; SEQ SVG-OM1-OM2; SVG-PL   CORONARY STENT INTERVENTION N/A 08/05/2016   Procedure: Coronary Stent Intervention;  Surgeon: Isaias Cowman, MD;  Location: [place] [facility];  Service: Cardiovascular;  Laterality: N/A;   ESOPHAGOGASTRODUODENOSCOPY (EGD) WITH PROPOFOL N/A 11/03/2018   Procedure: ESOPHAGOGASTRODUODENOSCOPY (EGD) WITH PROPOFOL;  Surgeon: Virgel Manifold, MD;  Location: [facility];  Service: Endoscopy;  Laterality: N/A;   HYSTERECTOMY ABDOMINAL WITH SALPINGO-OOPHORECTOMY Bilateral 08/15/2017   Procedure: HYSTERECTOMY ABDOMINAL WITH BILATERAL SALPINGO-OOPHORECTOMY;  Surgeon: Rubie Maid, MD;  Location: [facility] ORS;  Service: Gynecology;  Laterality: Bilateral;   LEFT HEART CATH AND CORONARY ANGIOGRAPHY N/A 08/05/2016   Procedure: Left Heart Cath and Coronary Angiography;  Surgeon: Isaias Cowman, MD;  Location: [place] [facility];  Service: Cardiovascular;  Laterality: N/A;   LEFT HEART CATH AND CORS/GRAFTS ANGIOGRAPHY N/A 09/19/2018   Procedure: LEFT HEART CATH AND CORS/GRAFTS ANGIOGRAPHY;  Surgeon: Corey Skains, MD;  Location: [place] [facility];  Service: Cardiovascular;  Laterality: N/A;   LEFT HEART CATH AND CORS/GRAFTS ANGIOGRAPHY N/A 08/30/2019   Procedure: LEFT HEART CATH AND CORS/GRAFTS ANGIOGRAPHY;  Surgeon: Teodoro Spray, MD;  Location: [place] [facility];  Service: Cardiovascular;  Laterality: N/A;    OOPHORECTOMY     TEE WITHOUT CARDIOVERSION N/A 11/10/2015   Procedure: TRANSESOPHAGEAL ECHOCARDIOGRAM (TEE);  Surgeon: [name], MD;  Location: [place];  Service: Open Heart Surgery;  Laterality: N/A;   TUBAL LIGATION      Prior to Admission medications   Medication Sig Start Date End Date Taking? Authorizing Provider  acetaminophen (TYLENOL) 325 MG tablet Take 650 mg by mouth every 6 (six) hours as needed for moderate pain or headache.     [provider]  bismuth subsalicylate (PEPTO BISMOL) 262 MG chewable tablet Chew 524 mg by mouth as needed.    [provider]  clopidogrel (PLAVIX) 75 MG tablet Take 1 tablet (75 mg total) by mouth daily. 09/05/20   [name]., MD  Dulaglutide (TRULICITY) 1.5 XK/4.8JE SOPN Inject 1.5 mg into the skin once a week. 08/08/20   [name]., MD  ELIQUIS 5 MG TABS tablet Take 5 mg by mouth 2 (two) times daily. 03/26/20   [provider]  ezetimibe (ZETIA) 10 MG tablet Take 1 tablet (10 mg total) by mouth daily. 09/05/20   [name]., MD  isosorbide mononitrate (IMDUR) 30  MG 24 hr tablet Take 30 mg by mouth daily.    [provider]  JARDIANCE 25 MG TABS tablet TAKE ONE TABLET BY MOUTH BEFORE BREAKFAST 06/30/20   [name]., MD  Lancets [facility] DELICA PLUS [id number]) MISC USE UP TO 4 TIMES DAILY AS DIRECTED 07/10/19   [name]., MD  loperamide (IMODIUM) 2 MG capsule Take 2 mg by mouth as needed.    [provider]  losartan (COZAAR) 25 MG tablet Take 1 tablet (25 mg total) by mouth daily. 08/11/20   [name]., MD  metoprolol tartrate (LOPRESSOR) 100 MG tablet Take 100 mg by mouth 2 (two) times daily. 10/03/18   [provider]  nitroGLYCERIN (NITROSTAT) 0.4 MG SL tablet Place 1 tablet (0.4 mg total) under the tongue every 5 (five) minutes as needed for chest pain. 07/27/16   Bettey Costa, MD  nystatin (NYSTATIN) powder Apply 1 application  topically daily. Patient taking differently: Apply 1 application topically daily as needed. 02/14/20   Rubie Maid, MD  ondansetron (ZOFRAN-ODT) 8 MG disintegrating tablet Take 1 tablet (8 mg total) by mouth every 8 (eight) hours as needed. 07/29/20   [name]., MD  pantoprazole (PROTONIX) 20 MG tablet Take 1 tablet (20 mg total) by mouth 2 (two) times daily. 06/10/20   [name]., MD  rosuvastatin (CRESTOR) 40 MG tablet Take 1 tablet (40 mg total) by mouth daily. 09/05/20   [name]., MD  sertraline (ZOLOFT) 50 MG tablet Take 1 tablet (50 mg total) by mouth daily. 06/27/20   [name]., MD  cetirizine (ZYRTEC) 5 MG tablet Take 2 tablets (10 mg total) by mouth daily. 12/19/18 02/06/20  [name]., MD    Allergies  Allergen Reactions   Lisinopril Cough   Penicillins Swelling, Rash and Other (See Comments)    Did it involve swelling of the face/tongue/throat, SOB, or low BP? Yes Did it involve sudden or severe rash/hives, skin peeling, or any reaction on the inside of your mouth or nose? Yes Did you need to seek medical attention at a hospital or doctor's office? Yes When did it last happen?      15 years If all above answers are "NO", may proceed with cephalosporin use.     Family History  Problem Relation Age of Onset   Cancer Father    Hypertension Father    Heart disease Father    Asthma Father    Cancer Mother    Hypertension Mother    Pancreatic cancer Mother 72   Cancer Sister    Breast cancer Sister 34   Breast cancer Sister 15   Lung cancer Brother    Pancreatic cancer Sister 35   Cancer Sister     Social History Social History   Tobacco Use   Smoking status: Former    Packs/day: 0.50    Pack years: 0.00    Types: Cigarettes    Quit date: 10/07/2001    Years since quitting: 18.9   Smokeless tobacco: Never  Vaping Use   Vaping Use: Never used  Substance Use Topics   Alcohol use: No    Alcohol/week: 0.0  standard drinks   Drug use: No    Review of Systems Constitutional: Negative for fever. Cardiovascular: Positive for chest pain/tightness, nearly resolved now. Respiratory: Negative for shortness of breath. Gastrointestinal: Negative for abdominal pain, vomiting.  Did get nauseated after taking nitroglycerin Genitourinary:  Negative for urinary compaints Musculoskeletal: Negative for musculoskeletal complaints Skin: Negative for skin complaints  Neurological: Developed a headache after nitroglycerin. All other ROS negative  ____________________________________________   PHYSICAL EXAM:  VITAL SIGNS: ED Triage Vitals  Enc Vitals Group     BP 09/24/20 0839 117/77     Pulse Rate 09/24/20 0839 90     Resp 09/24/20 0839 18     Temp 09/24/20 0839 98.5 F (36.9 C)     Temp Source 09/24/20 0839 Oral     SpO2 09/24/20 0839 98 %     Weight 09/24/20 0839 167 lb (75.8 kg)     Height 09/24/20 0839 5\' 2"  (1.575 m)     Head Circumference --      Peak Flow --      Pain Score 09/24/20 0841 7     Pain Loc --      Pain Edu? --      Excl. in [place]? --    Constitutional: Alert and oriented. Well appearing and in no distress. Eyes: Normal exam ENT      Mouth/Throat: Mucous membranes are moist. Cardiovascular: Normal rate, regular rhythm. Respiratory: Normal respiratory effort without tachypnea nor retractions. Breath sounds are clear  Gastrointestinal: Soft and nontender. No distention.   Musculoskeletal: Nontender with normal range of motion in all extremities.  Neurologic:  Normal speech and language. No gross focal neurologic deficits  Skin:  Skin is warm, dry and intact.  Psychiatric: Mood and affect are normal.  ____________________________________________    EKG  EKG viewed and interpreted by myself shows a normal sinus rhythm at 93 bpm with a slightly widened QRS, normal axis, largely normal intervals.  Patient does have inferolateral ST depressions although present on prior EKG  possibly slightly worse on this EKG.  ____________________________________________    RADIOLOGY  Chest x-ray is negative  ____________________________________________   INITIAL IMPRESSION / ASSESSMENT AND PLAN / ED COURSE  Pertinent labs & imaging results that were available during my care of the patient were reviewed by me and considered in my medical decision making (see chart for details).   Patient presents emergency department for chest pain/tightness during cardiac rehab.  Patient states that chest pain is gone but still has a very mild tightness.  Patient is EKG is slightly changed from prior although similar.  Chest x-ray is clear.  Lab work is so far reassuring including a negative troponin.  Patient will require a 2-hour repeat.  Patient states that chest tightness is no worse than she has experienced in the past but they were worried and made her come to the emergency department so she came for evaluation.  Repeat troponin remains negative.  Patient continues to appear well and states she is feeling much better.  We will discharge patient home with cardiology follow-up.  Patient agreeable to plan of care.  MYA SUELL was evaluated in Emergency Department on 09/24/2020 for the symptoms described in the history of present illness. She was evaluated in the context of the global COVID-19 pandemic, which necessitated consideration that the patient might be at risk for infection with the SARS-CoV-2 virus that causes COVID-19. Institutional protocols and algorithms that pertain to the evaluation of patients at risk for COVID-19 are in a state of rapid change based on information released by regulatory bodies including the CDC and federal and state organizations. These policies and algorithms were followed during the patient's care in the ED.  ____________________________________________   FINAL CLINICAL IMPRESSION(S) / ED DIAGNOSES  Chest pain   Harvest Dark, MD 09/24/20  1142

## 2020-09-24 NOTE — ED Triage Notes (Signed)
Pt comes into the ED via Cardiac rehab c/o central and left side chest pain.  Pt was ambulating with cardiac rehab today when the chest pain started.  Pt self administered 1 nitro.  Initial pressure had systolic of 417 and after nitro was dropped to 96 with no pain relief.  Pt has h/o MI's in the past.  Pt also presents with nausea.

## 2020-09-25 ENCOUNTER — Other Ambulatory Visit: Payer: Self-pay | Admitting: Family Medicine

## 2020-09-25 DIAGNOSIS — E118 Type 2 diabetes mellitus with unspecified complications: Secondary | ICD-10-CM

## 2020-09-26 ENCOUNTER — Other Ambulatory Visit: Payer: Self-pay

## 2020-09-26 ENCOUNTER — Encounter: Payer: Medicare Other | Attending: Internal Medicine | Admitting: *Deleted

## 2020-09-26 ENCOUNTER — Telehealth: Payer: Self-pay

## 2020-09-26 DIAGNOSIS — I214 Non-ST elevation (NSTEMI) myocardial infarction: Secondary | ICD-10-CM

## 2020-09-26 DIAGNOSIS — I252 Old myocardial infarction: Secondary | ICD-10-CM | POA: Diagnosis not present

## 2020-09-26 NOTE — Progress Notes (Signed)
Daily Session Note  Patient Details  Name: Candace [name] MRN: [id number] Date of Birth: 1951-03-01 Referring Provider:   Flowsheet Row Cardiac Rehab from 08/05/2020 in [name] Cardiac and Pulmonary Rehab  Referring Provider [name]       Encounter Date: 09/26/2020  Check In:  Session Check In - 09/26/20 0836       Check-In   Supervising physician immediately available to respond to emergencies See telemetry face sheet for immediately available ER MD    Location [facility]-Cardiac & Pulmonary Rehab    Staff Present Heath Lark, RN, BSN, CCRP;Joseph Parker, RCP,RRT,BSRT;Jessica [place], [place], Nara [place]isa, CCRP, CCET    Virtual Visit No    Medication changes reported     Yes    Comments Losartan 25 was ordered she just received from pharmacy  plans to start    Fall or balance concerns reported    No    Warm-up and Cool-down Performed on first and last piece of equipment    Resistance Training Performed Yes    VAD Patient? No    PAD/SET Patient? No      Pain Assessment   Currently in Pain? No/denies                Social History   Tobacco Use  Smoking Status Former   Packs/day: 0.50   Pack years: 0.00   Types: Cigarettes   Quit date: 10/07/2001   Years since quitting: 18.9  Smokeless Tobacco Never    Goals Met:  Independence with exercise equipment Exercise tolerated well No report of cardiac concerns or symptoms  Goals Unmet:  Not Applicable  Comments: Pt able to follow exercise prescription today without complaint.  Will continue to monitor for progression.    Dr. Emily Filbert is Medical Director for [place].  Dr. Ottie Glazier is Medical Director for [facility] Pulmonary Rehabilitation.

## 2020-09-26 NOTE — Progress Notes (Signed)
Per [facility], Patient states she was prescribe Potassium while in the hospital and asked if the pharmacy had received an Rx for it.Per [facility] we have not received a RX for Potassium, and are not sure if she should still be taking this or not.  Spoke with patient, patient states she was recently seen by [facility] hospital who prescribe her Potassium, but per chart note she was recently seen at [facility] hospital which has no information about starting Potassium.Notified Clinical Pharmacist.  Per Clinical Pharmacist,I would send message to the PCP so they can evaluate if she needs it and can send an Rx.  Ask patient if she may had the prescription sent to another pharmacy.Patient states she may had [facility] and [facility] on Tuesday to see if it is something she needs to be taking.  [place] Pharmacist Assistant [phone]

## 2020-09-30 ENCOUNTER — Other Ambulatory Visit: Payer: Self-pay | Admitting: Family Medicine

## 2020-09-30 ENCOUNTER — Telehealth: Payer: Self-pay | Admitting: Family Medicine

## 2020-09-30 DIAGNOSIS — E1169 Type 2 diabetes mellitus with other specified complication: Secondary | ICD-10-CM

## 2020-09-30 DIAGNOSIS — I214 Non-ST elevation (NSTEMI) myocardial infarction: Secondary | ICD-10-CM

## 2020-09-30 NOTE — Telephone Encounter (Signed)
Patient called, left VM to return the call for a message from the provider.

## 2020-09-30 NOTE — Telephone Encounter (Signed)
Please advise. I can not find if patient needs to be on this or not.

## 2020-09-30 NOTE — Telephone Encounter (Signed)
Medication: rosuvastatin (CRESTOR) 40 MG tablet [[id number]]   Has the patient contacted their pharmacy? YES  (Agent: If no, request that the patient contact the pharmacy for the refill.) (Agent: If yes, when and what did the pharmacy advise?)  Preferred Pharmacy (with phone number or street name): [facility] - [place], [place] - [address] [place] [address] Phone: [phone] Fax: [phone] Hours: Not open 24 hours    Agent: Please be advised that RX refills may take up to 3 business days. We ask that you follow-up with your pharmacy.

## 2020-09-30 NOTE — Telephone Encounter (Signed)
[facility] has script and will send out today. Patient request that script be on hold but they will send out

## 2020-09-30 NOTE — Telephone Encounter (Signed)
Lmtcb okay for [facility] triage to advise patient of doctors message below. KW

## 2020-09-30 NOTE — Patient Instructions (Signed)
Thank you for allowing the Chronic Care Management team to participate in your care.  It was a pleasure speaking with you. Please feel free to contact me with questions.  Goals Addressed: Patient Care Plan: Heart Failure (Adult)   Problem Identified: Symptom Exacerbation (Heart Failure)   Priority: High     Long-Range Goal: Symptom Exacerbation Prevented or Minimized   Start Date: 09/09/2020  Expected End Date: 01/07/2021  Recent Progress: On track  Priority: High     Current Barriers:  Chronic Disease Management support and educational needs r/t CHF.  Case Manager Clinical Goal(s):  Over the next 120 days, patient will not require hospitalization or emergent evaluation d/t complications r/t CHF exacerbation.  Interventions:  Collaboration with [name]., MD regarding development and update of comprehensive plan of care as evidenced by provider attestation and co-signature Inter-disciplinary care team collaboration (see longitudinal plan of care) Reviewed medications and current plan for CHF management. Reports starting Lasix 20 mg/day on 09/06/20. She was instructed to adjust the dose d/t not being able to tolerate her Cardiac Rehab session on 09/08/20. Reports experiencing lightheadedness and dizziness after starting the session. Recalls her systolic BP dropping to 99. Reports feeling well today. She anticipates continuing Cardiac Rehab three times a week. She will adjust lasix to every other day until she is evaluated by the Cardiology team.  Reviewed established weight parameters and indications for notifying a provider. Reports weights have been stable over the last few days. Reports weight of 166 lbs on yesterday. Weight was 165 lbs today. Encouraged to continue weighing daily and recording readings. Denies abdominal or lower extremity edema. Denies episodes of chest pain, palpitations or shortness of breath. Reports doing well with maintaining a cardiac prudent/heart  healthy diet. Reviewed worsening symptoms r/t CHF exacerbation and indications for seeking immediate medical attention.     Patient Goals/Self-Care Activities:  -Take medications as prescribed -Follow plan for CHF symptom management and notify provider with concerns -Monitor and record weights -Adhere to recommended cardiac diet -Notify provider or care management team with questions or new concerns as needed    Follow Up Plan:  Will follow up next month       PLAN:  Candace [name] verbalized understanding of the information discussed during the telephonic outreach. Declined need for mailed/printed instructions. A member of the care management team will follow up next month.     [name] Care Management [facility] [phone]

## 2020-09-30 NOTE — Telephone Encounter (Signed)
Pt is calling to ask should she be taking Potassium? If so a new script is needed to [place]- [phone]

## 2020-10-01 ENCOUNTER — Other Ambulatory Visit: Payer: Self-pay

## 2020-10-01 DIAGNOSIS — I214 Non-ST elevation (NSTEMI) myocardial infarction: Secondary | ICD-10-CM

## 2020-10-01 DIAGNOSIS — I252 Old myocardial infarction: Secondary | ICD-10-CM | POA: Diagnosis not present

## 2020-10-01 NOTE — Progress Notes (Signed)
Daily Session Note  Patient Details  Name: Candace [name] MRN: [id number] Date of Birth: 08/06/1950 Referring Provider:   Flowsheet Row Cardiac Rehab from 08/05/2020 in [facility] Cardiac and Pulmonary Rehab  Referring Provider [name]       Encounter Date: 10/01/2020  Check In:  Session Check In - 10/01/20 0755       Check-In   Supervising physician immediately available to respond to emergencies See telemetry face sheet for immediately available ER MD    Location [facility]-Cardiac & Pulmonary Rehab    Staff Present Birdie Sons, MPA, RN;Laureen Owens Shark, BS, RRT, CPFT;Joseph Parker, [place]    Virtual Visit No    Medication changes reported     No    Fall or balance concerns reported    No    Warm-up and Cool-down Performed on first and last piece of equipment    Resistance Training Performed Yes    VAD Patient? No    PAD/SET Patient? No      Pain Assessment   Currently in Pain? No/denies               Exercise Prescription Changes - 10/01/20 0700       Home Exercise Plan   Plans to continue exercise at Home (comment)   walking, dancing   Frequency Add 2 additional days to program exercise sessions.    Initial Home Exercises Provided 10/01/20             Social History   Tobacco Use  Smoking Status Former   Packs/day: 0.50   Pack years: 0.00   Types: Cigarettes   Quit date: 10/07/2001   Years since quitting: 18.9  Smokeless Tobacco Never    Goals Met:  Independence with exercise equipment Exercise tolerated well No report of cardiac concerns or symptoms Strength training completed today  Goals Unmet:  Not Applicable  Comments: Pt able to follow exercise prescription today without complaint.  Will continue to monitor for progression.    Dr. Emily Filbert is Medical Director for [place].  Dr. Ottie Glazier is Medical Director for [facility] Pulmonary Rehabilitation.

## 2020-10-03 ENCOUNTER — Telehealth: Payer: Self-pay

## 2020-10-03 ENCOUNTER — Other Ambulatory Visit: Payer: Self-pay

## 2020-10-03 ENCOUNTER — Encounter: Payer: Medicare Other | Admitting: *Deleted

## 2020-10-03 DIAGNOSIS — I252 Old myocardial infarction: Secondary | ICD-10-CM | POA: Diagnosis not present

## 2020-10-03 DIAGNOSIS — I214 Non-ST elevation (NSTEMI) myocardial infarction: Secondary | ICD-10-CM

## 2020-10-03 NOTE — Progress Notes (Signed)
[facility], please confirm if patient received  zetia and rosuvastatin from another pharmacy because it will be too soon to refill.  Patient states she [facility] on 09/22/2020, and Rosuvastatin 40 mg from [facility] for 25 day supply.Patient is asking for a refill for Trulicity to be deliver on 10/08/2020 because she has one pen left on hand as of 10/03/2020. I Completed Acute form and sent to clinical pharmacist for review.  [place] Pharmacist Assistant [phone]

## 2020-10-03 NOTE — Progress Notes (Signed)
Daily Session Note  Patient Details  Name: Candace [name] MRN: [id number] Date of Birth: 1950-10-25 Referring Provider:   Flowsheet Row Cardiac Rehab from 08/05/2020 in [facility] Cardiac [facility]  Referring Provider [name]       Encounter Date: 10/03/2020  Check In:  Session Check In - 10/03/20 0809       Check-In   Supervising physician immediately available to respond to emergencies See telemetry face sheet for immediately available ER MD    Location [facility]-Cardiac & Pulmonary Rehab    Staff Present Hope Budds, RDN, LDN;Jessica Luan Pulling, MA, RCEP, CCRP, CCET;Aysia Lowder, RN, BSN, CCRP    Virtual Visit No    Medication changes reported     No    Fall or balance concerns reported    No    Warm-up and Cool-down Performed on first and last piece of equipment    Resistance Training Performed Yes    VAD Patient? No    PAD/SET Patient? No      Pain Assessment   Currently in Pain? No/denies                Social History   Tobacco Use  Smoking Status Former   Packs/day: 0.50   Pack years: 0.00   Types: Cigarettes   Quit date: 10/07/2001   Years since quitting: 19.0  Smokeless Tobacco Never    Goals Met:  Independence with exercise equipment Exercise tolerated well No report of cardiac concerns or symptoms  Goals Unmet:  Not Applicable  Comments: Pt able to follow exercise prescription today without complaint.  Will continue to monitor for progression.    Dr. Emily Filbert is Medical Director for [facility].  Dr. Ottie Glazier is Medical Director for [facility] Pulmonary Rehabilitation.

## 2020-10-04 ENCOUNTER — Other Ambulatory Visit: Payer: Self-pay

## 2020-10-04 ENCOUNTER — Emergency Department: Payer: Medicare Other

## 2020-10-04 ENCOUNTER — Encounter: Payer: Self-pay | Admitting: Emergency Medicine

## 2020-10-04 DIAGNOSIS — E782 Mixed hyperlipidemia: Secondary | ICD-10-CM | POA: Diagnosis not present

## 2020-10-04 DIAGNOSIS — I110 Hypertensive heart disease with heart failure: Secondary | ICD-10-CM | POA: Diagnosis not present

## 2020-10-04 DIAGNOSIS — Z7984 Long term (current) use of oral hypoglycemic drugs: Secondary | ICD-10-CM | POA: Diagnosis not present

## 2020-10-04 DIAGNOSIS — E1169 Type 2 diabetes mellitus with other specified complication: Secondary | ICD-10-CM | POA: Insufficient documentation

## 2020-10-04 DIAGNOSIS — Z87891 Personal history of nicotine dependence: Secondary | ICD-10-CM | POA: Diagnosis not present

## 2020-10-04 DIAGNOSIS — R0602 Shortness of breath: Secondary | ICD-10-CM | POA: Diagnosis not present

## 2020-10-04 DIAGNOSIS — R079 Chest pain, unspecified: Secondary | ICD-10-CM | POA: Diagnosis not present

## 2020-10-04 DIAGNOSIS — Z8679 Personal history of other diseases of the circulatory system: Secondary | ICD-10-CM | POA: Diagnosis not present

## 2020-10-04 DIAGNOSIS — Z951 Presence of aortocoronary bypass graft: Secondary | ICD-10-CM | POA: Diagnosis not present

## 2020-10-04 DIAGNOSIS — Z79899 Other long term (current) drug therapy: Secondary | ICD-10-CM | POA: Insufficient documentation

## 2020-10-04 DIAGNOSIS — Z7901 Long term (current) use of anticoagulants: Secondary | ICD-10-CM | POA: Insufficient documentation

## 2020-10-04 DIAGNOSIS — I5032 Chronic diastolic (congestive) heart failure: Secondary | ICD-10-CM | POA: Insufficient documentation

## 2020-10-04 DIAGNOSIS — R0789 Other chest pain: Secondary | ICD-10-CM | POA: Diagnosis not present

## 2020-10-04 DIAGNOSIS — J449 Chronic obstructive pulmonary disease, unspecified: Secondary | ICD-10-CM | POA: Insufficient documentation

## 2020-10-04 DIAGNOSIS — Z7902 Long term (current) use of antithrombotics/antiplatelets: Secondary | ICD-10-CM | POA: Diagnosis not present

## 2020-10-04 DIAGNOSIS — K5750 Diverticulosis of both small and large intestine without perforation or abscess without bleeding: Secondary | ICD-10-CM | POA: Diagnosis not present

## 2020-10-04 DIAGNOSIS — Z9889 Other specified postprocedural states: Secondary | ICD-10-CM | POA: Diagnosis not present

## 2020-10-04 DIAGNOSIS — I700 Atherosclerosis of aorta: Secondary | ICD-10-CM | POA: Diagnosis not present

## 2020-10-04 DIAGNOSIS — K449 Diaphragmatic hernia without obstruction or gangrene: Secondary | ICD-10-CM | POA: Diagnosis not present

## 2020-10-04 DIAGNOSIS — I2510 Atherosclerotic heart disease of native coronary artery without angina pectoris: Secondary | ICD-10-CM | POA: Insufficient documentation

## 2020-10-04 DIAGNOSIS — R1010 Upper abdominal pain, unspecified: Secondary | ICD-10-CM | POA: Diagnosis not present

## 2020-10-04 DIAGNOSIS — Z955 Presence of coronary angioplasty implant and graft: Secondary | ICD-10-CM | POA: Diagnosis not present

## 2020-10-04 LAB — HEPATIC FUNCTION PANEL
ALT: 21 U/L (ref 0–44)
AST: 27 U/L (ref 15–41)
Albumin: 3.8 g/dL (ref 3.5–5.0)
Alkaline Phosphatase: 107 U/L (ref 38–126)
Bilirubin, Direct: <0.1 mg/dL (ref 0.0–0.2)
Total Bilirubin: 0.5 mg/dL (ref 0.3–1.2)
Total Protein: 6.9 g/dL (ref 6.5–8.1)

## 2020-10-04 LAB — CBC
HCT: 42 % (ref 36.0–46.0)
Hemoglobin: 13.9 g/dL (ref 12.0–15.0)
MCH: 32.6 pg (ref 26.0–34.0)
MCHC: 33.1 g/dL (ref 30.0–36.0)
MCV: 98.4 fL (ref 80.0–100.0)
Platelets: 176 10*3/uL (ref 150–400)
RBC: 4.27 MIL/uL (ref 3.87–5.11)
RDW: 12.5 % (ref 11.5–15.5)
WBC: 7.7 10*3/uL (ref 4.0–10.5)
nRBC: 0 % (ref 0.0–0.2)

## 2020-10-04 LAB — LIPASE, BLOOD: Lipase: 60 U/L — ABNORMAL HIGH (ref 11–51)

## 2020-10-04 LAB — TROPONIN I (HIGH SENSITIVITY)
Troponin I (High Sensitivity): 7 ng/L (ref <18)
Troponin I (High Sensitivity): 7 ng/L (ref <18)

## 2020-10-04 LAB — BASIC METABOLIC PANEL
Anion gap: 4 — ABNORMAL LOW (ref 5–15)
BUN: 16 mg/dL (ref 8–23)
CO2: 28 mmol/L (ref 22–32)
Calcium: 9 mg/dL (ref 8.9–10.3)
Chloride: 108 mmol/L (ref 98–111)
Creatinine, Ser: 0.82 mg/dL (ref 0.44–1.00)
GFR, Estimated: >60 mL/min (ref >60)
Glucose, Bld: 220 mg/dL — ABNORMAL HIGH (ref 70–99)
Potassium: 3.9 mmol/L (ref 3.5–5.1)
Sodium: 140 mmol/L (ref 135–145)

## 2020-10-04 NOTE — ED Triage Notes (Signed)
Pt to ED via POV with c/o substernal chest pain. Pt states radiation to mid upper epigastric area and L shoulder. Pt states pain has been ongoing x several days. Pt states took 2 doses of SL nitro PTA. Pt states pain is sharp in nature and tightening in upper abd. Pt also c/o SOB and nausea.

## 2020-10-05 ENCOUNTER — Emergency Department: Payer: Medicare Other

## 2020-10-05 ENCOUNTER — Emergency Department
Admission: EM | Admit: 2020-10-05 | Discharge: 2020-10-05 | Disposition: A | Discharge status: Home / Self Care | Payer: Medicare Other | Attending: Emergency Medicine | Admitting: Emergency Medicine

## 2020-10-05 DIAGNOSIS — Z9889 Other specified postprocedural states: Secondary | ICD-10-CM | POA: Diagnosis not present

## 2020-10-05 DIAGNOSIS — K449 Diaphragmatic hernia without obstruction or gangrene: Secondary | ICD-10-CM | POA: Diagnosis not present

## 2020-10-05 DIAGNOSIS — R0789 Other chest pain: Secondary | ICD-10-CM | POA: Diagnosis not present

## 2020-10-05 DIAGNOSIS — R079 Chest pain, unspecified: Secondary | ICD-10-CM

## 2020-10-05 DIAGNOSIS — K5750 Diverticulosis of both small and large intestine without perforation or abscess without bleeding: Secondary | ICD-10-CM | POA: Diagnosis not present

## 2020-10-05 DIAGNOSIS — I700 Atherosclerosis of aorta: Secondary | ICD-10-CM | POA: Diagnosis not present

## 2020-10-05 LAB — URINALYSIS, COMPLETE (UACMP) WITH MICROSCOPIC
Bilirubin Urine: NEGATIVE
Glucose, UA: >=500 mg/dL — AB
Ketones, ur: 5 mg/dL — AB
Leukocytes,Ua: NEGATIVE
Nitrite: NEGATIVE
Protein, ur: NEGATIVE mg/dL
Specific Gravity, Urine: 1.036 — ABNORMAL HIGH (ref 1.005–1.030)
pH: 6 (ref 5.0–8.0)

## 2020-10-05 LAB — BRAIN NATRIURETIC PEPTIDE: B Natriuretic Peptide: 87 pg/mL (ref 0.0–100.0)

## 2020-10-05 MED ORDER — ONDANSETRON HCL 4 MG/2ML IJ SOLN
4.0000 mg | Freq: Once | INTRAMUSCULAR | Status: AC
  Administered 2020-10-05: 4 mg via INTRAVENOUS
  Filled 2020-10-05: qty 2

## 2020-10-05 MED ORDER — MORPHINE SULFATE (PF) 4 MG/ML IV SOLN
4.0000 mg | Freq: Once | INTRAVENOUS | Status: AC
  Administered 2020-10-05: 4 mg via INTRAVENOUS
  Filled 2020-10-05: qty 1

## 2020-10-05 MED ORDER — IOHEXOL 350 MG/ML SOLN
100.0000 mL | Freq: Once | INTRAVENOUS | Status: AC | PRN
  Administered 2020-10-05: 100 mL via INTRAVENOUS

## 2020-10-05 NOTE — ED Notes (Signed)
ED Provider at bedside. 

## 2020-10-05 NOTE — ED Provider Notes (Signed)
[facility] Emergency Department Provider Note  ____________________________________________   Event Date/Time   First MD Initiated Contact with Patient 10/05/20 0008     (approximate)  I have reviewed the triage vital signs and the nursing notes.   HISTORY  Chief Complaint Chest Pain    HPI Candace [name] is a 70 y.o. female with atrial fibrillation on Eliquis, diabetes, prior heart attack with CABG who comes in with concern for chest pain.  Patient reports back in April she had an NSTEMI where they did a catheterization but did not do any interventions.  She is been at cardiac rehab since then.  She reports intermittent chest pain and shortness of breath.  She states its been going on for several days, intermittent, occurs at rest, a fullness in her upper abdomen and some pain sharp and stabbing in the middle of her chest and a little bit into her shoulder.  She reports taking 2 nitro without any relief in symptoms.  She does report a little bit of shortness of breath with it but denies any at this time.  She reports being compliant with her Eliquis.          Past Medical History:  Diagnosis Date   Allergy    Anemia    Anxiety    Arrhythmia    Arthritis    Atrial fibrillation ([place])    Coronary artery disease    Depression    Diabetes mellitus without complication ([place])    Dyspnea    doe   Dysrhythmia    GERD (gastroesophageal reflux disease)    Headache    History of hiatal hernia    Hyperlipidemia    Hypertension    Myocardial infarction ([place])    2016, 04/2017   Myocardial infarction with cardiac rehabilitation [facility])    MI 2016/ CABG 8/17    FINISHED CARDIAC REHAB 3 WEEKS AGO   Panic attack    Pneumonia    Reflux    Stroke ([place]) 2015   showed up on MRI; no weakness noted   TIA (transient ischemic attack)    Voice tremor     Patient Active Problem List   Diagnosis Date Noted   Vertigo    Unsteady gait    Acute bronchitis  03/07/2019   Esophageal dysphagia    Stomach irritation    Gastric polyp    Esophageal lump    Hx of colonic polyp    Polyp of colon    Diverticulosis of large intestine without diverticulitis    PSVT (paroxysmal supraventricular tachycardia) ([place]) 10/03/2018   Abnormal ECG 07/05/2018   Tachycardia 03/27/2018   Pain in limb 02/28/2018   Chronic diastolic CHF (congestive heart failure) ([place]) 11/03/2017   TIA (transient ischemic attack) 11/03/2017   COPD suggested by initial evaluation ([place]) 08/23/2017   Acute and chronic respiratory failure with hypoxia ([place]) 08/23/2017   Postoperative state 08/15/2017   Incidental pulmonary nodule 08/01/2017   Non-ST elevation myocardial infarction (NSTEMI), subendocardial infarction, subsequent episode of care ([place]) 06/01/2017   B12 deficiency 12/01/2016   Vitamin D deficiency 11/04/2016   Depression with anxiety 09/30/2016   AF (paroxysmal atrial fibrillation) ([place]) 09/30/2016   Resting tremor 09/30/2016   Mild obstructive sleep apnea 09/30/2016   Headache 08/18/2016   Unstable angina ([place]) 08/03/2016   Type 2 diabetes mellitus with complication, without long-term current use of insulin ([place]) 08/03/2016   Chronic daily headache 05/05/2016   Asthma 11/11/2015   S/P CABG x  4 11/10/2015   Coronary artery disease 11/07/2015   Carotid artery narrowing 02/08/2014   Chest pain 02/08/2014   Mixed hyperlipidemia 02/08/2014   Bilateral carotid artery stenosis 02/08/2014   Atypical chest pain 02/07/2014   Essential hypertension 02/07/2014   Awareness of heartbeats 02/07/2014   D (diarrhea) 10/05/2013   Gastroesophageal reflux disease 10/05/2013    Past Surgical History:  Procedure Laterality Date   ABDOMINAL HYSTERECTOMY     APPENDECTOMY  1975   ARTERY BIOPSY Right 04/26/2016   Procedure: BIOPSY TEMPORAL ARTERY;  Surgeon: Margaretha Sheffield, MD;  Location: [facility] ORS;  Service: ENT;  Laterality: Right;   CARDIAC CATHETERIZATION N/A 11/06/2015    Procedure: Left Heart Cath and Coronary Angiography;  Surgeon: Corey Skains, MD;  Location: [place] CV LAB;  Service: Cardiovascular;  Laterality: N/A;   CESAREAN SECTION     COLONOSCOPY  2015   COLONOSCOPY WITH PROPOFOL N/A 11/03/2018   Procedure: COLONOSCOPY WITH PROPOFOL;  Surgeon: Virgel Manifold, MD;  Location: [facility] ENDOSCOPY;  Service: Endoscopy;  Laterality: N/A;   CORONARY ANGIOPLASTY  04/2017   [place]   CORONARY ARTERY BYPASS GRAFT N/A 11/10/2015   Procedure: CORONARY ARTERY BYPASS GRAFTING (CABG), ON PUMP, TIMES FOUR, USING LEFT INTERNAL MAMMARY ARTERY, BILATERAL GREATER SAPHENOUS VEINS HARVESTED ENDOSCOPICALLY;  Surgeon: [name], MD;  Location: [place];  Service: Open Heart Surgery;  Laterality: N/A;  LIMA-LAD; SEQ SVG-OM1-OM2; SVG-PL   CORONARY STENT INTERVENTION N/A 08/05/2016   Procedure: Coronary Stent Intervention;  Surgeon: Isaias Cowman, MD;  Location: [place] CV LAB;  Service: Cardiovascular;  Laterality: N/A;   ESOPHAGOGASTRODUODENOSCOPY (EGD) WITH PROPOFOL N/A 11/03/2018   Procedure: ESOPHAGOGASTRODUODENOSCOPY (EGD) WITH PROPOFOL;  Surgeon: Virgel Manifold, MD;  Location: [facility] ENDOSCOPY;  Service: Endoscopy;  Laterality: N/A;   HYSTERECTOMY ABDOMINAL WITH SALPINGO-OOPHORECTOMY Bilateral 08/15/2017   Procedure: HYSTERECTOMY ABDOMINAL WITH BILATERAL SALPINGO-OOPHORECTOMY;  Surgeon: Rubie Maid, MD;  Location: [facility] ORS;  Service: Gynecology;  Laterality: Bilateral;   LEFT HEART CATH AND CORONARY ANGIOGRAPHY N/A 08/05/2016   Procedure: Left Heart Cath and Coronary Angiography;  Surgeon: Isaias Cowman, MD;  Location: [place] CV LAB;  Service: Cardiovascular;  Laterality: N/A;   LEFT HEART CATH AND CORS/GRAFTS ANGIOGRAPHY N/A 09/19/2018   Procedure: LEFT HEART CATH AND CORS/GRAFTS ANGIOGRAPHY;  Surgeon: Corey Skains, MD;  Location: [place] CV LAB;  Service: Cardiovascular;  Laterality: N/A;   LEFT HEART CATH  AND CORS/GRAFTS ANGIOGRAPHY N/A 08/30/2019   Procedure: LEFT HEART CATH AND CORS/GRAFTS ANGIOGRAPHY;  Surgeon: Teodoro Spray, MD;  Location: [place] CV LAB;  Service: Cardiovascular;  Laterality: N/A;   OOPHORECTOMY     TEE WITHOUT CARDIOVERSION N/A 11/10/2015   Procedure: TRANSESOPHAGEAL ECHOCARDIOGRAM (TEE);  Surgeon: [name], MD;  Location: [place];  Service: Open Heart Surgery;  Laterality: N/A;   TUBAL LIGATION      Prior to Admission medications   Medication Sig Start Date End Date Taking? Authorizing Provider  acetaminophen (TYLENOL) 325 MG tablet Take 650 mg by mouth every 6 (six) hours as needed for moderate pain or headache.     [provider]  bismuth subsalicylate (PEPTO BISMOL) 262 MG chewable tablet Chew 524 mg by mouth as needed.    [provider]  clopidogrel (PLAVIX) 75 MG tablet Take 1 tablet (75 mg total) by mouth daily. 09/05/20   [name]., MD  Dulaglutide (TRULICITY) 1.5 LG/9.2JJ SOPN Inject 1.5 mg into the skin once a week. 08/08/20  [name]., MD  ELIQUIS 5 MG TABS tablet Take 5 mg by mouth 2 (two) times daily. 03/26/20   [provider]  ezetimibe (ZETIA) 10 MG tablet Take 1 tablet (10 mg total) by mouth daily. 09/05/20   [name]., MD  isosorbide mononitrate (IMDUR) 30 MG 24 hr tablet Take 30 mg by mouth daily.    [provider]  JARDIANCE 25 MG TABS tablet TAKE ONE TABLET BY MOUTH BEFORE BREAKFAST 06/30/20   [name]., MD  Lancets [facility] DELICA PLUS [id number]) MISC USE UP TO 4 TIMES DAILY AS DIRECTED 07/10/19   [name]., MD  loperamide (IMODIUM) 2 MG capsule Take 2 mg by mouth as needed.    [provider]  losartan (COZAAR) 25 MG tablet Take 1 tablet (25 mg total) by mouth daily. 08/11/20   [name]., MD  metoprolol tartrate (LOPRESSOR) 100 MG tablet Take 100 mg by mouth 2 (two) times daily. 10/03/18   [provider]   nitroGLYCERIN (NITROSTAT) 0.4 MG SL tablet Place 1 tablet (0.4 mg total) under the tongue every 5 (five) minutes as needed for chest pain. 07/27/16   Bettey Costa, MD  nystatin (NYSTATIN) powder Apply 1 application topically daily. Patient taking differently: Apply 1 application topically daily as needed. 02/14/20   Rubie Maid, MD  ondansetron (ZOFRAN-ODT) 8 MG disintegrating tablet Take 1 tablet (8 mg total) by mouth every 8 (eight) hours as needed. 07/29/20   [name]., MD  pantoprazole (PROTONIX) 20 MG tablet Take 1 tablet (20 mg total) by mouth 2 (two) times daily. 06/10/20   [name]., MD  rosuvastatin (CRESTOR) 40 MG tablet Take 1 tablet (40 mg total) by mouth daily. 09/05/20   [name]., MD  sertraline (ZOLOFT) 50 MG tablet Take 1 tablet (50 mg total) by mouth daily. 06/27/20   [name]., MD  cetirizine (ZYRTEC) 5 MG tablet Take 2 tablets (10 mg total) by mouth daily. 12/19/18 02/06/20  [name]., MD    Allergies Lisinopril and Penicillins  Family History  Problem Relation Age of Onset   Cancer Father    Hypertension Father    Heart disease Father    Asthma Father    Cancer Mother    Hypertension Mother    Pancreatic cancer Mother 46   Cancer Sister    Breast cancer Sister 98   Breast cancer Sister 59   Lung cancer Brother    Pancreatic cancer Sister 19   Cancer Sister     Social History Social History   Tobacco Use   Smoking status: Former    Packs/day: 0.50    Pack years: 0.00    Types: Cigarettes    Quit date: 10/07/2001    Years since quitting: 19.0   Smokeless tobacco: Never  Vaping Use   Vaping Use: Never used  Substance Use Topics   Alcohol use: No    Alcohol/week: 0.0 standard drinks   Drug use: No      Review of Systems Constitutional: No fever/chills Eyes: No visual changes. ENT: No sore throat. Cardiovascular: Positive chest pain Respiratory: Positive shortness of  breath Gastrointestinal: No abdominal pain.  No nausea, no vomiting.  No diarrhea.  No constipation. Genitourinary: Negative for dysuria. Musculoskeletal: Negative for back pain. Skin: Negative for rash. Neurological: Negative for headaches, focal weakness or numbness. All other ROS negative ____________________________________________   PHYSICAL EXAM:  VITAL SIGNS: ED Triage Vitals  Enc Vitals Group     BP 10/04/20 1952 (!) 152/83     Pulse Rate 10/04/20 1952 82     Resp 10/04/20 1952 (!) 22     Temp 10/04/20 1952 99.3 F (37.4 C)     Temp Source 10/04/20 1952 Oral     SpO2 10/04/20 1952 97 %     Weight 10/04/20 1949 167 lb (75.8 kg)     Height 10/04/20 1949 5\' 2"  (1.575 m)     Head Circumference --      Peak Flow --      Pain Score 10/04/20 1949 8     Pain Loc --      Pain Edu? --      Excl. in [place]? --     Constitutional: Alert and oriented. Well appearing and in no acute distress. Eyes: Conjunctivae are normal. EOMI. Head: Atraumatic. Nose: No congestion/rhinnorhea. Mouth/Throat: Mucous membranes are moist.   Neck: No stridor. Trachea Midline. FROM Cardiovascular: Normal rate, regular rhythm. Grossly normal heart sounds.  Good peripheral circulation. Respiratory: Normal respiratory effort.  No retractions. Lungs CTAB. Gastrointestinal: Some mild tenderness in the right lower quadrant and a little bit epigastrically no distention. No abdominal bruits.  Musculoskeletal: No lower extremity tenderness nor edema.  No joint effusions. Neurologic:  Normal speech and language. No gross focal neurologic deficits are appreciated.  Skin:  Skin is warm, dry and intact. No rash noted. Psychiatric: Mood and affect are normal. Speech and behavior are normal. GU: Deferred   ____________________________________________   LABS (all labs ordered are listed, but only abnormal results are displayed)  Labs Reviewed  BASIC METABOLIC PANEL - Abnormal; Notable for the following  components:      Result Value   Glucose, Bld 220 (*)    Anion gap 4 (*)    All other components within normal limits  LIPASE, BLOOD - Abnormal; Notable for the following components:   Lipase 60 (*)    All other components within normal limits  CBC  HEPATIC FUNCTION PANEL  TROPONIN I (HIGH SENSITIVITY)  TROPONIN I (HIGH SENSITIVITY)   ____________________________________________   ED ECG REPORT I, Vanessa [place], the attending physician, personally viewed and interpreted this ECG.  Normal sinus rate of 80, no ST elevation, left bundle branch block, T wave version in aVL, type I AV block  Review prior EKG from 09/25/2020 and patient has a old left bundle branch block. ____________________________________________  RADIOLOGY I, Vanessa [place], personally viewed and evaluated these images (plain radiographs) as part of my medical decision making, as well as reviewing the written report by the radiologist.  ED MD interpretation: No pneumonia  Official radiology report(s): DG Chest 2 View  Result Date: 10/04/2020 CLINICAL DATA:  Chest pain, shortness of breath EXAM: CHEST - 2 VIEW COMPARISON:  Radiograph 09/24/2020 FINDINGS: Chronic hyperinflation with flattening of the diaphragms. No consolidation, features of edema, pneumothorax, or effusion. Prior sternotomy and CABG. The aorta is calcified. The remaining cardiomediastinal contours are unremarkable. Multilevel degenerative changes are present in the imaged portions of the spine. Additional degenerative changes noted in the shoulders which are quite severe on the left though similar to comparison prior. No acute osseous abnormality or suspicious osseous lesion. Cervical carotid atherosclerosis noted on the right. Remaining soft tissues are unremarkable. IMPRESSION: 1. No acute cardiopulmonary abnormality. 2. Chronic hyperinflation. 3. Prior sternotomy and CABG. 4. Severe degenerative changes in left shoulder. 5. Right cervical carotid  atherosclerosis. 6.  Aortic Atherosclerosis (ICD10-I70.0). Electronically Signed   By: Lovena Le M.D.   On: 10/04/2020 20:40    ____________________________________________   PROCEDURES  Procedure(s) performed (including Critical Care):  .1-3 Lead EKG Interpretation  Date/Time: 10/05/2020 3:38 AM Performed by: Vanessa [place], MD Authorized by: Vanessa Mait[place], MD     Interpretation: normal     ECG rate:  80s   ECG rate assessment: normal     Rhythm: sinus rhythm     Ectopy: none     Conduction: normal     ____________________________________________   INITIAL IMPRESSION / ASSESSMENT AND PLAN / ED COURSE  Candace [name] was evaluated in Emergency Department on 10/05/2020 for the symptoms described in the history of present illness. She was evaluated in the context of the global COVID-19 pandemic, which necessitated consideration that the patient might be at risk for infection with the SARS-CoV-2 virus that causes COVID-19. Institutional protocols and algorithms that pertain to the evaluation of patients at risk for COVID-19 are in a state of rapid change based on information released by regulatory bodies including the CDC and federal and state organizations. These policies and algorithms were followed during the patient's care in the ED.    Patient is a 70 year old who comes in with chest pain.  Patient has significant cardiac history which is concerning for ACS.  EKG and cardiac markers to evaluate for ACS.  Lower suspicion for dissection given no pain radiating to her back this was going on for a few days now.  Low suspicion for PE given patient is on Eliquis.  Consider abdominal pathology given patient reports of fullness in her abdomen is some tenderness.  Will get CT imaging to evaluate for potential causes.   Cardiac markers are negative x2 LFTs are normal.  Lipase is slightly elevated No evidence of anemia Sugar slightly elevated at 220 but no evidence of DKA and similar to  prior  Discussed with patient that her work-up is reassuring however patient does have significant cardiac disease and they had talked about potentially doing another cath/stenting if patient continued to have chest pain.  However I did explain that her EKG and cardiac markers were reassuring today given these risk factors we could admit patient to be seen by cardiology and to decide if further work-up is necessary.  Patient reports relief in her symptoms after having the morphine and would prefer to go home.  She is going to call Dr. Nehemiah Massed office on Monday to get a follow-up.  She understands that if her symptoms return especially if they are worsening that she needs to return to the ER immediately because I cannot predict a future heart attack even though there is no signs of a heart attack today  I did send inbox message to cardiology to hopefully get sooner appointment.   ____________________________________________   FINAL CLINICAL IMPRESSION(S) / ED DIAGNOSES   Final diagnoses:  Chest pain, unspecified type      MEDICATIONS GIVEN DURING THIS VISIT:  Medications  morphine 4 MG/ML injection 4 mg (4 mg Intravenous Given 10/05/20 0211)  ondansetron (ZOFRAN) injection 4 mg (4 mg Intravenous Given 10/05/20 0210)  iohexol (OMNIPAQUE) 350 MG/ML injection 100 mL (100 mLs Intravenous Contrast Given 10/05/20 0230)     ED Discharge Orders     None        Note:  This document was prepared using Dragon voice recognition software and may include unintentional dictation errors.    Vanessa Dix, MD 10/05/20  [id number]  

## 2020-10-05 NOTE — Discharge Instructions (Addendum)
Please call your cardiologist on Monday to get follow-up.  I have also sent him a message stating that you were here today.  Your cardiac markers were negative but if you develop return of pain, worsening pain or any other concerns you need to return to the ER for repeat evaluation

## 2020-10-06 DIAGNOSIS — I10 Essential (primary) hypertension: Secondary | ICD-10-CM | POA: Diagnosis not present

## 2020-10-06 DIAGNOSIS — I6523 Occlusion and stenosis of bilateral carotid arteries: Secondary | ICD-10-CM | POA: Diagnosis not present

## 2020-10-06 DIAGNOSIS — I214 Non-ST elevation (NSTEMI) myocardial infarction: Secondary | ICD-10-CM | POA: Diagnosis not present

## 2020-10-06 DIAGNOSIS — I5033 Acute on chronic diastolic (congestive) heart failure: Secondary | ICD-10-CM | POA: Diagnosis not present

## 2020-10-06 DIAGNOSIS — I25110 Atherosclerotic heart disease of native coronary artery with unstable angina pectoris: Secondary | ICD-10-CM | POA: Diagnosis not present

## 2020-10-06 DIAGNOSIS — I480 Paroxysmal atrial fibrillation: Secondary | ICD-10-CM | POA: Diagnosis not present

## 2020-10-06 DIAGNOSIS — R9431 Abnormal electrocardiogram [ECG] [EKG]: Secondary | ICD-10-CM | POA: Diagnosis not present

## 2020-10-06 DIAGNOSIS — I471 Supraventricular tachycardia: Secondary | ICD-10-CM | POA: Diagnosis not present

## 2020-10-08 ENCOUNTER — Encounter: Payer: Medicare Other | Admitting: *Deleted

## 2020-10-08 ENCOUNTER — Other Ambulatory Visit: Payer: Self-pay

## 2020-10-08 ENCOUNTER — Encounter: Payer: Self-pay | Admitting: *Deleted

## 2020-10-08 DIAGNOSIS — I252 Old myocardial infarction: Secondary | ICD-10-CM | POA: Diagnosis not present

## 2020-10-08 DIAGNOSIS — I214 Non-ST elevation (NSTEMI) myocardial infarction: Secondary | ICD-10-CM

## 2020-10-08 NOTE — Progress Notes (Signed)
Cardiac Individual Treatment Plan  Patient Details  Name: Candace [name] MRN: [id number] Date of Birth: [birth year] Referring Provider:   Flowsheet Row Cardiac Rehab from 08/05/2020 in [facility] Cardiac and Pulmonary Rehab  Referring Provider [name]       Initial Encounter Date:  Flowsheet Row Cardiac Rehab from 08/05/2020 in [facility] Cardiac and Pulmonary Rehab  Date 08/05/20       Visit Diagnosis: NSTEMI (non-ST elevated myocardial infarction) [facility])  Patient's Home Medications on Admission:  Current Outpatient Medications:    acetaminophen (TYLENOL) 325 MG tablet, Take 650 mg by mouth every 6 (six) hours as needed for moderate pain or headache. , Disp: , Rfl:    bismuth subsalicylate (PEPTO BISMOL) 262 MG chewable tablet, Chew 524 mg by mouth as needed., Disp: , Rfl:    clopidogrel (PLAVIX) 75 MG tablet, Take 1 tablet (75 mg total) by mouth daily., Disp: 90 tablet, Rfl: 0   Dulaglutide (TRULICITY) 1.5 CL/2.7NT SOPN, Inject 1.5 mg into the skin once a week., Disp: 2 mL, Rfl: 2   ELIQUIS 5 MG TABS tablet, Take 5 mg by mouth 2 (two) times daily., Disp: , Rfl:    ezetimibe (ZETIA) 10 MG tablet, Take 1 tablet (10 mg total) by mouth daily., Disp: 90 tablet, Rfl: 0   isosorbide mononitrate (IMDUR) 30 MG 24 hr tablet, Take 30 mg by mouth daily., Disp: , Rfl:    JARDIANCE 25 MG TABS tablet, TAKE ONE TABLET BY MOUTH BEFORE BREAKFAST, Disp: 30 tablet, Rfl: 2   Lancets (ONETOUCH DELICA PLUS [id number]) MISC, USE UP TO 4 TIMES DAILY AS DIRECTED, Disp: 100 each, Rfl: 0   loperamide (IMODIUM) 2 MG capsule, Take 2 mg by mouth as needed., Disp: , Rfl:    losartan (COZAAR) 25 MG tablet, Take 1 tablet (25 mg total) by mouth daily., Disp: 90 tablet, Rfl: 0   metoprolol tartrate (LOPRESSOR) 100 MG tablet, Take 100 mg by mouth 2 (two) times daily., Disp: , Rfl:    nitroGLYCERIN (NITROSTAT) 0.4 MG SL tablet, Place 1 tablet (0.4 mg total) under the tongue every 5 (five) minutes as needed for chest  pain., Disp: 30 tablet, Rfl: 0   nystatin (NYSTATIN) powder, Apply 1 application topically daily. (Patient taking differently: Apply 1 application topically daily as needed.), Disp: 60 g, Rfl: 5   ondansetron (ZOFRAN-ODT) 8 MG disintegrating tablet, Take 1 tablet (8 mg total) by mouth every 8 (eight) hours as needed., Disp: 20 tablet, Rfl: 1   pantoprazole (PROTONIX) 20 MG tablet, Take 1 tablet (20 mg total) by mouth 2 (two) times daily., Disp: 60 tablet, Rfl: 11   rosuvastatin (CRESTOR) 40 MG tablet, Take 1 tablet (40 mg total) by mouth daily., Disp: 90 tablet, Rfl: 0   sertraline (ZOLOFT) 50 MG tablet, Take 1 tablet (50 mg total) by mouth daily., Disp: 90 tablet, Rfl: 1  Past Medical History: Past Medical History:  Diagnosis Date   Allergy    Anemia    Anxiety    Arrhythmia    Arthritis    Atrial fibrillation ([place])    Coronary artery disease    Depression    Diabetes mellitus without complication ([place])    Dyspnea    doe   Dysrhythmia    GERD (gastroesophageal reflux disease)    Headache    History of hiatal hernia    Hyperlipidemia    Hypertension    Myocardial infarction [facility])    2016, 04/2017   Myocardial infarction with cardiac rehabilitation (  [place])    MI 2016/ CABG 8/17    FINISHED CARDIAC REHAB 3 WEEKS AGO   Panic attack    Pneumonia    Reflux    Stroke ([place]) 2015   showed up on MRI; no weakness noted   TIA (transient ischemic attack)    Voice tremor     Tobacco Use: Social History   Tobacco Use  Smoking Status Former   Packs/day: 0.50   Pack years: 0.00   Types: Cigarettes   Quit date: 10/07/2001   Years since quitting: 19.0  Smokeless Tobacco Never    Labs: Recent Review Flowsheet Data     Labs for ITP Cardiac and Pulmonary Rehab Latest Ref Rng & Units 08/31/2019 09/04/2019 12/06/2019 05/01/2020 08/11/2020   Cholestrol 100 - 199 mg/dL 234(H) - - - 164   LDLCALC 0 - 99 mg/dL 163(H) - - - 90   HDL >39 mg/dL 49 - - - 57   Trlycerides 0 - 149 mg/dL 110 - - - 94    Hemoglobin A1c 4.8 - 5.6 % - 9.4(A) 12.4(A) 12.2(A) 9.1(H)   PHART 7.350 - 7.450 - - - - -   PCO2ART 35.0 - 45.0 mmHg - - - - -   HCO3 20.0 - 24.0 mEq/L - - - - -   TCO2 0 - 100 mmol/L - - - - -   O2SAT % - - - - -        Exercise Target Goals: Exercise Program Goal: Individual exercise prescription set using results from initial 6 min walk test and THRR while considering  patient's activity barriers and safety.   Exercise Prescription Goal: Initial exercise prescription builds to 30-45 minutes a day of aerobic activity, 2-3 days per week.  Home exercise guidelines will be given to patient during program as part of exercise prescription that the participant will acknowledge.   Education: Aerobic Exercise: - Group verbal and visual presentation on the components of exercise prescription. Introduces F.I.T.T principle from ACSM for exercise prescriptions.  Reviews F.I.T.T. principles of aerobic exercise including progression. Written material given at graduation. Flowsheet Row Cardiac Rehab from 10/08/2020 in [facility] Cardiac and Pulmonary Rehab  Education need identified 08/05/20  Date 09/17/20  Educator [facility]  Instruction Review Code 1- Verbalizes Understanding       Education: Resistance Exercise: - Group verbal and visual presentation on the components of exercise prescription. Introduces F.I.T.T principle from ACSM for exercise prescriptions  Reviews F.I.T.T. principles of resistance exercise including progression. Written material given at graduation.    Education: Exercise & Equipment Safety: - Individual verbal instruction and demonstration of equipment use and safety with use of the equipment. Flowsheet Row Cardiac Rehab from 10/08/2020 in [facility] Cardiac and Pulmonary Rehab  Date 08/05/20  Educator [facility]  Instruction Review Code 1- Verbalizes Understanding       Education: Exercise Physiology & General Exercise Guidelines: - Group verbal and written instruction with models to  review the exercise physiology of the cardiovascular system and associated critical values. Provides general exercise guidelines with specific guidelines to those with heart or lung disease.  Flowsheet Row Cardiac Rehab from 10/08/2020 in [facility] Cardiac and Pulmonary Rehab  Date 09/10/20  Educator [facility]  Instruction Review Code 1- Verbalizes Understanding       Education: Flexibility, Balance, Mind/Body Relaxation: - Group verbal and visual presentation with interactive activity on the components of exercise prescription. Introduces F.I.T.T principle from ACSM for exercise prescriptions. Reviews F.I.T.T. principles of flexibility and balance exercise training  including progression. Also discusses the mind body connection.  Reviews various relaxation techniques to help reduce and manage stress (i.e. Deep breathing, progressive muscle relaxation, and visualization). Balance handout provided to take home. Written material given at graduation. Flowsheet Row Cardiac Rehab from 10/08/2020 in [facility] Cardiac and Pulmonary Rehab  Date 10/01/20  Educator [facility]  Instruction Review Code 1- Verbalizes Understanding       Activity Barriers & Risk Stratification:  Activity Barriers & Cardiac Risk Stratification - 08/05/20 0905       Activity Barriers & Cardiac Risk Stratification   Activity Barriers Back Problems;Deconditioning;Shortness of Breath;Muscular Weakness;Balance Concerns;History of Falls    Cardiac Risk Stratification High             6 Minute Walk:  6 Minute Walk     Row Name 08/05/20 0903         6 Minute Walk   Phase Initial     Distance 930 feet     Walk Time 5.53 minutes     # of Rest Breaks 1  28 sec     MPH 1.91     METS 2.58     RPE 14     Perceived Dyspnea  3     VO2 Peak 9.05     Symptoms Yes (comment)     Comments chest tightness 7-10, legs aching (thighs and calves) 8/10, SOB, wobbly gait     Resting HR 85 bpm     Resting BP 122/64     Resting Oxygen Saturation  96 %      Exercise Oxygen Saturation  during 6 min walk 98 %     Max Ex. HR 128 bpm     Max Ex. BP 156/74     2 Minute Post BP 126/70              Oxygen Initial Assessment:   Oxygen Re-Evaluation:   Oxygen Discharge (Final Oxygen Re-Evaluation):   Initial Exercise Prescription:  Initial Exercise Prescription - 08/05/20 0900       Date of Initial Exercise RX and Referring Provider   Date 08/05/20    Referring Provider [name]      NuStep   Level 2    SPM 80    Minutes 15    METs 2.5      Arm Ergometer   Level 1    Watts 25    RPM 25    Minutes 15    METs 2      Track   Laps 25    Minutes 15    METs 2.4      Prescription Details   Frequency (times per week) 3    Duration Progress to 30 minutes of continuous aerobic without signs/symptoms of physical distress      Intensity   THRR 40-80% of Max Heartrate 111-138    Ratings of Perceived Exertion 11-13    Perceived Dyspnea 0-4      Progression   Progression Continue to progress workloads to maintain intensity without signs/symptoms of physical distress.      Resistance Training   Training Prescription Yes    Weight 3 lb    Reps 10-15             Perform Capillary Blood Glucose checks as needed.  Exercise Prescription Changes:   Exercise Prescription Changes     Row Name 08/05/20 0900 08/18/20 1200 09/02/20 1400 09/15/20 1100 09/30/20 1400     Response to  Exercise   Blood Pressure (Admit) 122/64 100/58 120/68 108/68 112/66   Blood Pressure (Exercise) 156/74 142/80 162/88 124/78 122/76   Blood Pressure (Exit) 126/70 110/60 112/74 122/76 92/70   Heart Rate (Admit) 85 bpm 113 bpm 77 bpm 91 bpm 96 bpm   Heart Rate (Exercise) 128 bpm 162 bpm 126 bpm 139 bpm 140 bpm   Heart Rate (Exit) 76 bpm 109 bpm 77 bpm 104 bpm 78 bpm   Oxygen Saturation (Admit) 96 % -- -- -- --   Oxygen Saturation (Exercise) 98 % -- -- -- --   Rating of Perceived Exertion (Exercise) 14 13 13 12 12    Perceived  Dyspnea (Exercise) 3 -- -- -- --   Symptoms wobbly gait, SOB, legs aching (8/10), chest tightness (7/10) high HR -- -- chest pain walking   Comments walk test results first day -- -- --   Duration -- Progress to 30 minutes of  aerobic without signs/symptoms of physical distress Continue with 30 min of aerobic exercise without signs/symptoms of physical distress. Continue with 30 min of aerobic exercise without signs/symptoms of physical distress. Continue with 30 min of aerobic exercise without signs/symptoms of physical distress.   Intensity -- THRR unchanged THRR unchanged THRR unchanged THRR unchanged     Progression   Progression -- Continue to progress workloads to maintain intensity without signs/symptoms of physical distress. Continue to progress workloads to maintain intensity without signs/symptoms of physical distress. Continue to progress workloads to maintain intensity without signs/symptoms of physical distress. Continue to progress workloads to maintain intensity without signs/symptoms of physical distress.   Average METs -- 2.6 2.85 2.6 2.74     Resistance Training   Training Prescription -- Yes Yes Yes Yes   Weight -- 3 lb 3 lb 3 lb 5 lb   Reps -- 10-15 10-15 10-15 10-15     Interval Training   Interval Training -- -- No No No     NuStep   Level -- -- 2 -- 2   Minutes -- -- 15 -- 15   METs -- -- 3.5 -- 3.6     Arm Ergometer   Level -- 1 -- 1.3 1.5   Minutes -- 15 -- 15 15   METs -- 2.71 -- 2.73 2     Track   Laps -- 25 22 28 30    Minutes -- 15 15 15 15    METs -- 2.4 2.2 2.45 2.63    Row Name 10/01/20 0700             Home Exercise Plan   Plans to continue exercise at Home (comment)  walking, dancing       Frequency Add 2 additional days to program exercise sessions.       Initial Home Exercises Provided 10/01/20                Exercise Comments:   Exercise Comments     Row Name 08/18/20 0857 09/24/20 0845         Exercise Comments First full  day of exercise!  Patient was oriented to gym and equipment including functions, settings, policies, and procedures.  Patient's individual exercise prescription and treatment plan were reviewed.  All starting workloads were established based on the results of the 6 minute walk test done at initial orientation visit.  The plan for exercise progression was also introduced and progression will be customized based on patient's performance and goals.  Candace [name] has attended the program in the past  Pt completed 20 laps on the track and then needed to rest. Pt unable to continue exercising today due to 7/10 tightness in chest/chest pain. Pt BP checked, 122/76, and advised to take nitro per her prescription. Pt BP recheck at 92/70 with headache, nausea, and chest pain increasing per patient (pain described as stabbing and sharp at times, pressure, and discomfort). Pt was taken to emergency room by Cyril Mourning, RN, for further evaluation.               Exercise Goals and Review:   Exercise Goals     Row Name 08/05/20 0907             Exercise Goals   Increase Physical Activity Yes       Intervention Provide advice, education, support and counseling about physical activity/exercise needs.;Develop an individualized exercise prescription for aerobic and resistive training based on initial evaluation findings, risk stratification, comorbidities and participant's personal goals.       Expected Outcomes Short Term: Attend rehab on a regular basis to increase amount of physical activity.;Long Term: Add in home exercise to make exercise part of routine and to increase amount of physical activity.;Long Term: Exercising regularly at least 3-5 days a week.       Increase Strength and Stamina Yes       Intervention Provide advice, education, support and counseling about physical activity/exercise needs.;Develop an individualized exercise prescription for aerobic and resistive training based on initial evaluation findings,  risk stratification, comorbidities and participant's personal goals.       Expected Outcomes Short Term: Increase workloads from initial exercise prescription for resistance, speed, and METs.;Short Term: Perform resistance training exercises routinely during rehab and add in resistance training at home;Long Term: Improve cardiorespiratory fitness, muscular endurance and strength as measured by increased METs and functional capacity (6MWT)       Able to understand and use rate of perceived exertion (RPE) scale Yes       Intervention Provide education and explanation on how to use RPE scale       Expected Outcomes Short Term: Able to use RPE daily in rehab to express subjective intensity level;Long Term:  Able to use RPE to guide intensity level when exercising independently       Able to understand and use Dyspnea scale Yes       Intervention Provide education and explanation on how to use Dyspnea scale       Expected Outcomes Short Term: Able to use Dyspnea scale daily in rehab to express subjective sense of shortness of breath during exertion;Long Term: Able to use Dyspnea scale to guide intensity level when exercising independently       Knowledge and understanding of Target Heart Rate Range (THRR) Yes       Intervention Provide education and explanation of THRR including how the numbers were predicted and where they are located for reference       Expected Outcomes Short Term: Able to use daily as guideline for intensity in rehab;Short Term: Able to state/look up THRR;Long Term: Able to use THRR to govern intensity when exercising independently       Able to check pulse independently Yes       Intervention Provide education and demonstration on how to check pulse in carotid and radial arteries.;Review the importance of being able to check your own pulse for safety during independent exercise       Expected Outcomes Short Term: Able to explain why pulse checking is  important during independent  exercise;Long Term: Able to check pulse independently and accurately       Understanding of Exercise Prescription Yes       Intervention Provide education, explanation, and written materials on patient's individual exercise prescription       Expected Outcomes Short Term: Able to explain program exercise prescription;Long Term: Able to explain home exercise prescription to exercise independently                Exercise Goals Re-Evaluation :  Exercise Goals Re-Evaluation     Row Name 08/18/20 0857 09/02/20 1427 09/15/20 1158 09/30/20 1425 10/01/20 0737     Exercise Goal Re-Evaluation   Exercise Goals Review Knowledge and understanding of Target Heart Rate Range (THRR);Able to understand and use rate of perceived exertion (RPE) scale;Able to understand and use Dyspnea scale;Able to check pulse independently;Understanding of Exercise Prescription Increase Physical Activity;Increase Strength and Stamina;Understanding of Exercise Prescription Increase Physical Activity;Increase Strength and Stamina Increase Physical Activity;Increase Strength and Stamina;Understanding of Exercise Prescription Increase Physical Activity;Increase Strength and Stamina;Understanding of Exercise Prescription;Able to understand and use rate of perceived exertion (RPE) scale;Able to understand and use Dyspnea scale;Knowledge and understanding of Target Heart Rate Range (THRR);Able to check pulse independently   Comments Reviewed RPE and dyspnea scales, THR and program prescription with pt today.  Pt voiced understanding and was given a copy of goals to take home. Candace [name] has missed her last two appointments.  She is up to 22 laps on the track.  We will encourage improved attendance and continue to monitor her progress. Candace [name] did 28 laps on the track today!  Staff will review home exercise soon so she can workout safelyy at home on days not at [facility]. Candace [name] had some chest pain last week that did not relieve with nitorglcerin and was taken  to ED where it did resolve. She is up to 30 laps on the track.  We will continue to monitor her progress. Candace [name] is doing well in rehab.  She is pleased is with her progress.  She is doing better than she better expected. She is already walking at home and dancing around the house.  She is also using water bottle weights.  Updated home exercise plan from previous rehab. She has been stretching and it is helping her neck tightness.   Expected Outcomes Short: Use RPE daily to regulate intensity. Long: Follow program prescription in THR. Short: Improved attendance again Long: Continue to improve stamina Short: review home exercise Long: attend consistently Short: Increase NuStep Long: Continue to improve stamina Short: Continue to walk more at home, focus on HR Long: Continue to improve stamina            Discharge Exercise Prescription (Final Exercise Prescription Changes):  Exercise Prescription Changes - 10/01/20 0700       Home Exercise Plan   Plans to continue exercise at Home (comment)   walking, dancing   Frequency Add 2 additional days to program exercise sessions.    Initial Home Exercises Provided 10/01/20             Nutrition:  Target Goals: Understanding of nutrition guidelines, daily intake of sodium 1500mg , cholesterol 200mg , calories 30% from fat and 7% or less from saturated fats, daily to have 5 or more servings of fruits and vegetables.  Education: All About Nutrition: -Group instruction provided by verbal, written material, interactive activities, discussions, models, and posters to present general guidelines for heart healthy nutrition including fat, fiber, MyPlate, the  role of sodium in heart healthy nutrition, utilization of the nutrition label, and utilization of this knowledge for meal planning. Follow up email sent as well. Written material given at graduation. Flowsheet Row Cardiac Rehab from 10/08/2020 in [facility] Cardiac and Pulmonary Rehab  Education need identified  08/05/20  Date 10/08/20  Educator [place]  Instruction Review Code 1- Verbalizes Understanding       Biometrics:  Pre Biometrics - 08/05/20 0908       Pre Biometrics   Height 5' 2.1" (1.577 m)    Weight 166 lb (75.3 kg)    BMI (Calculated) 30.28    Single Leg Stand 0.8 seconds              Nutrition Therapy Plan and Nutrition Goals:  Nutrition Therapy & Goals - 08/20/20 1531       Nutrition Therapy   Diet Heart healthy, low Na, diabetes friendly eating.    Drug/Food Interactions Statins/Certain Fruits    Protein (specify units) 60g    Fiber 25 grams    Whole Grain Foods 3 servings    Saturated Fats 12 max. grams    Sodium 1.5 grams      Personal Nutrition Goals   Nutrition Goal ST: choose more non- starchy vegetables at meals, rinse off canned food with water and cloose low sodium or no salt added LT: balance meals using MyPlate method. limit salt intake to <1.5g/day    Comments B: yogurt with banana with water and medications S: walnuts or peanut butter or cheese crackers or glucerna L: salad and bottle of water and handful of walnuts or glucerna D:baked chicken, corn, cabbage, and dinner roll (did not finish). Meat (fish or shrimp, chicken, ground meat 1x/week, pork chop - typically meat is baked or missed in with other foods) and vegetable and CHO (mashed potatoes, corn, dinner rolls).  She mentioned she only has hot dogs 1x/month. She does not like collards or mixed green or eggplant or brussells sprouts. She eats lots of fruit. Discussed heart healhty eating and diabetes friendly eating.      Intervention Plan   Intervention Prescribe, educate and counsel regarding individualized specific dietary modifications aiming towards targeted core components such as weight, hypertension, lipid management, diabetes, heart failure and other comorbidities.    Expected Outcomes Short Term Goal: Understand basic principles of dietary content, such as calories, fat, sodium, cholesterol  and nutrients.;Short Term Goal: A plan has been developed with personal nutrition goals set during dietitian appointment.;Long Term Goal: Adherence to prescribed nutrition plan.             Nutrition Assessments:  MEDIFICTS Score Key: ?70 Need to make dietary changes  40-70 Heart Healthy Diet ? 40 Therapeutic Level Cholesterol Diet  Flowsheet Row Cardiac Rehab from 08/05/2020 in [facility] Cardiac and Pulmonary Rehab  Picture Your Plate Total Score on Admission 65      Picture Your Plate Scores: <69 Unhealthy dietary pattern with much room for improvement. 41-50 Dietary pattern unlikely to meet recommendations for good health and room for improvement. 51-60 More healthful dietary pattern, with some room for improvement.  >60 Healthy dietary pattern, although there may be some specific behaviors that could be improved.    Nutrition Goals Re-Evaluation:  Nutrition Goals Re-Evaluation     [place] Name 10/01/20 [id number]             Goals   Nutrition Goal ST: choose more non- starchy vegetables at meals, rinse off canned food with water and cloose  low sodium or no salt added LT: balance meals using MyPlate method. limit salt intake to <1.5g/day       Comment Candace [name] is doing well with her diet.  She is trying to get a better variety of vegetables in her salad.  She is working on balancing her meals better.  She still cheats on occasion and had [employer] for dinner last night with her grandkids.  She is watching her sodium more closely.       Expected Outcome Short: Limit eating out more Long: Continue to focus on healthy eating and more variety                Nutrition Goals Discharge (Final Nutrition Goals Re-Evaluation):  Nutrition Goals Re-Evaluation - 10/01/20 0752       Goals   Nutrition Goal ST: choose more non- starchy vegetables at meals, rinse off canned food with water and cloose low sodium or no salt added LT: balance meals using MyPlate method. limit salt intake to <1.5g/day     Comment Candace [name] is doing well with her diet.  She is trying to get a better variety of vegetables in her salad.  She is working on balancing her meals better.  She still cheats on occasion and had [employer] for dinner last night with her grandkids.  She is watching her sodium more closely.    Expected Outcome Short: Limit eating out more Long: Continue to focus on healthy eating and more variety             Psychosocial: Target Goals: Acknowledge presence or absence of significant depression and/or stress, maximize coping skills, provide positive support system. Participant is able to verbalize types and ability to use techniques and skills needed for reducing stress and depression.   Education: Stress, Anxiety, and Depression - Group verbal and visual presentation to define topics covered.  Reviews how body is impacted by stress, anxiety, and depression.  Also discusses healthy ways to reduce stress and to treat/manage anxiety and depression.  Written material given at graduation. Flowsheet Row Cardiac Rehab from 02/08/2018 in [facility] Cardiac and Pulmonary Rehab  Date 12/28/17  Educator [facility]  Instruction Review Code 5- Refused Teaching       Education: Sleep Hygiene -Provides group verbal and written instruction about how sleep can affect your health.  Define sleep hygiene, discuss sleep cycles and impact of sleep habits. Review good sleep hygiene tips.  Flowsheet Row Cardiac Rehab from 02/08/2018 in [facility] Cardiac and Pulmonary Rehab  Date 02/08/18  Educator [facility]  Instruction Review Code 1- Verbalizes Understanding       Initial Review & Psychosocial Screening:  Initial Psych Review & Screening - 08/01/20 1520       Initial Review   Current issues with History of Depression;Current Sleep Concerns;Current Stress Concerns    Source of Stress Concerns Chronic Illness;Unable to perform yard/household activities;Unable to participate in former interests or hobbies      [place]? Yes      Barriers   Psychosocial barriers to participate in program The patient should benefit from training in stress management and relaxation.;Psychosocial barriers identified (see note)      Screening Interventions   Interventions Encouraged to exercise;Provide feedback about the scores to participant;To provide support and resources with identified psychosocial needs    Expected Outcomes Short Term goal: Utilizing psychosocial counselor, staff and physician to assist with identification of specific Stressors or current issues interfering with healing process. Setting  desired goal for each stressor or current issue identified.;Long Term Goal: Stressors or current issues are controlled or eliminated.;Long Term goal: The participant improves quality of Life and PHQ9 Scores as seen by post scores and/or verbalization of changes;Short Term goal: Identification and review with participant of any Quality of Life or Depression concerns found by scoring the questionnaire.             Quality of Life Scores:   Quality of Life - 08/05/20 0908       Quality of Life   Select Quality of Life      Quality of Life Scores   Health/Function Pre 17.32 %    Socioeconomic Pre 18.21 %    Psych/Spiritual Pre 23.79 %    Family Pre 29.35 %    GLOBAL Pre 20.44 %            Scores of 19 and below usually indicate a poorer quality of life in these areas.  A difference of  2-3 points is a clinically meaningful difference.  A difference of 2-3 points in the total score of the Quality of Life Index has been associated with significant improvement in overall quality of life, self-image, physical symptoms, and general health in studies assessing change in quality of life.  PHQ-9: Recent Review Flowsheet Data     Depression screen [facility] 2/9 08/05/2020 04/15/2020 04/02/2020 02/06/2020 07/12/2019   Decreased Interest 1 0 0 1 1   Down, Depressed, Hopeless 1 0 0 0 0   PHQ - 2 Score 2 0 0 1 1    Altered sleeping 1 - - 1 1   Tired, decreased energy 1 - - 1 1   Change in appetite 1 - - 1 0   Feeling bad or failure about yourself  0 - - 0 0   Trouble concentrating 0 - - 0 0   Moving slowly or fidgety/restless 0 - - 0 0   Suicidal thoughts 0 - - 0 0   PHQ-9 Score 5 - - 4 3   Difficult doing work/chores Not difficult at all - - Not difficult at all Not difficult at all      Interpretation of Total Score  Total Score Depression Severity:  1-4 = Minimal depression, 5-9 = Mild depression, 10-14 = Moderate depression, 15-19 = Moderately severe depression, 20-27 = Severe depression   Psychosocial Evaluation and Intervention:  Psychosocial Evaluation - 08/01/20 1520       Psychosocial Evaluation & Interventions   Interventions Stress management education;Relaxation education;Encouraged to exercise with the program and follow exercise prescription    Comments Candace [name] is returning to the program after completing it in 2019. She has had a NSTEMI this time. She states that this last year has been filled with medical issues and she is finally starting to feel better. She just started back driving and is looking forward to picking up her grandkids from school. Her family is her support system and she cherishes them. She doesn't report any symptoms of depression today and states she is only focused on seeing the positive side of things. She is excited to come back to the program and see some familiar faces. She is really wanting to maintain her independence and she knows this is the best way to do that    Expected Outcomes Short: attend cardiac rehab for education and exercise. Long: Develop positive self care habits.    Continue Psychosocial Services  Follow up required by staff  Psychosocial Re-Evaluation:  Psychosocial Re-Evaluation     Candace [name] Name 10/01/20 [phone]             Psychosocial Re-Evaluation   Current issues with None Identified;Current Psychotropic Meds        Comments Annell is doing well in rehab.  She is feeling good mentally and stays positive.  She gets to see her great grands and grandkids more frequently now.  They all keep her busy. She sleeps pretty good and better on the days she exercises. Overall, she is doing well and feels well managed.       Expected Outcomes Short: Continue to stay positive.  Long: Continue to stay active.       Interventions Encouraged to attend Cardiac Rehabilitation for the exercise       Continue Psychosocial Services  Follow up required by staff                Psychosocial Discharge (Final Psychosocial Re-Evaluation):  Psychosocial Re-Evaluation - 10/01/20 0749       Psychosocial Re-Evaluation   Current issues with None Identified;Current Psychotropic Meds    Comments Candace [name] is doing well in rehab.  She is feeling good mentally and stays positive.  She gets to see her great grands and grandkids more frequently now.  They all keep her busy. She sleeps pretty good and better on the days she exercises. Overall, she is doing well and feels well managed.    Expected Outcomes Short: Continue to stay positive.  Long: Continue to stay active.    Interventions Encouraged to attend Cardiac Rehabilitation for the exercise    Continue Psychosocial Services  Follow up required by staff             Vocational Rehabilitation: Provide vocational rehab assistance to qualifying candidates.   Vocational Rehab Evaluation & Intervention:  Vocational Rehab - 08/01/20 1520       Initial Vocational Rehab Evaluation & Intervention   Assessment shows need for Vocational Rehabilitation No             Education: Education Goals: Education classes will be provided on a variety of topics geared toward better understanding of heart health and risk factor modification. Participant will state understanding/return demonstration of topics presented as noted by education test scores.  Learning Barriers/Preferences:  Learning  Barriers/Preferences - 08/01/20 1519       Learning Barriers/Preferences   Learning Barriers None    Learning Preferences None             General Cardiac Education Topics:  AED/CPR: - Group verbal and written instruction with the use of models to demonstrate the basic use of the AED with the basic ABC's of resuscitation. Flowsheet Row Cardiac Rehab from 02/08/2018 in [facility] Cardiac and Pulmonary Rehab  Date 01/26/18  Educator MA  Instruction Review Code 1- Verbalizes Understanding       Anatomy and Cardiac Procedures: - Group verbal and visual presentation and models provide information about basic cardiac anatomy and function. Reviews the testing methods done to diagnose heart disease and the outcomes of the test results. Describes the treatment choices: Medical Management, Angioplasty, or Coronary Bypass Surgery for treating various heart conditions including Myocardial Infarction, Angina, Valve Disease, and Cardiac Arrhythmias.  Written material given at graduation. Flowsheet Row Cardiac Rehab from 10/08/2020 in [facility] Cardiac and Pulmonary Rehab  Education need identified 08/05/20       Medication Safety: - Group verbal and visual instruction to review commonly prescribed medications for  heart and lung disease. Reviews the medication, class of the drug, and side effects. Includes the steps to properly store meds and maintain the prescription regimen.  Written material given at graduation. Flowsheet Row Cardiac Rehab from 02/08/2018 in [facility] Cardiac and Pulmonary Rehab  Date 01/17/18  Educator SB  Instruction Review Code 1- Verbalizes Understanding       Intimacy: - Group verbal instruction through game format to discuss how heart and lung disease can affect sexual intimacy. Written material given at graduation.. Flowsheet Row Cardiac Rehab from 10/08/2020 in [facility] Cardiac and Pulmonary Rehab  Date 09/17/20  Educator [facility]  Instruction Review Code 1- Verbalizes Understanding        Know Your Numbers and Heart Failure: - Group verbal and visual instruction to discuss disease risk factors for cardiac and pulmonary disease and treatment options.  Reviews associated critical values for Overweight/Obesity, Hypertension, Cholesterol, and Diabetes.  Discusses basics of heart failure: signs/symptoms and treatments.  Introduces Heart Failure Zone chart for action plan for heart failure.  Written material given at graduation. Flowsheet Row Cardiac Rehab from 10/08/2020 in [facility] Cardiac and Pulmonary Rehab  Date 08/20/20  Educator [facility]  Instruction Review Code 1- Verbalizes Understanding       Infection Prevention: - Provides verbal and written material to individual with discussion of infection control including proper hand washing and proper equipment cleaning during exercise session. Flowsheet Row Cardiac Rehab from 10/08/2020 in [facility] Cardiac and Pulmonary Rehab  Date 08/05/20  Educator [facility]  Instruction Review Code 1- Verbalizes Understanding       Falls Prevention: - Provides verbal and written material to individual with discussion of falls prevention and safety. Flowsheet Row Cardiac Rehab from 10/08/2020 in [facility] Cardiac and Pulmonary Rehab  Date 08/05/20  Educator [facility]  Instruction Review Code 1- Verbalizes Understanding       Other: -Provides group and verbal instruction on various topics (see comments)   Knowledge Questionnaire Score:  Knowledge Questionnaire Score - 08/05/20 0910       Knowledge Questionnaire Score   Pre Score 22/26 Education Focus: Angina, Nutrition, Exercise             Core Components/Risk Factors/Patient Goals at Admission:  Personal Goals and Risk Factors at Admission - 08/05/20 0910       Core Components/Risk Factors/Patient Goals on Admission    Weight Management Yes;Obesity;Weight Loss    Intervention Weight Management: Develop a combined nutrition and exercise program designed to reach desired caloric intake, while  maintaining appropriate intake of nutrient and fiber, sodium and fats, and appropriate energy expenditure required for the weight goal.;Weight Management: Provide education and appropriate resources to help participant work on and attain dietary goals.;Weight Management/Obesity: Establish reasonable short term and long term weight goals.;Obesity: Provide education and appropriate resources to help participant work on and attain dietary goals.    Admit Weight 166 lb (75.3 kg)    Goal Weight: Short Term 160 lb (72.6 kg)    Goal Weight: Long Term 155 lb (70.3 kg)    Expected Outcomes Short Term: Continue to assess and modify interventions until short term weight is achieved;Long Term: Adherence to nutrition and physical activity/exercise program aimed toward attainment of established weight goal;Weight Loss: Understanding of general recommendations for a balanced deficit meal plan, which promotes 1-2 lb weight loss per week and includes a negative energy balance of [phone] kcal/d;Understanding recommendations for meals to include 15-35% energy as protein, 25-35% energy from fat, 35-60% energy from carbohydrates, less than 200mg   of dietary cholesterol, 20-35 gm of total fiber daily;Understanding of distribution of calorie intake throughout the day with the consumption of 4-5 meals/snacks    Diabetes Yes    Intervention Provide education about signs/symptoms and action to take for hypo/hyperglycemia.;Provide education about proper nutrition, including hydration, and aerobic/resistive exercise prescription along with prescribed medications to achieve blood glucose in normal ranges: Fasting glucose 65-99 mg/dL    Expected Outcomes Short Term: Participant verbalizes understanding of the signs/symptoms and immediate care of hyper/hypoglycemia, proper foot care and importance of medication, aerobic/resistive exercise and nutrition plan for blood glucose control.;Long Term: Attainment of HbA1C < 7%.    Heart Failure  Yes    Intervention Provide a combined exercise and nutrition program that is supplemented with education, support and counseling about heart failure. Directed toward relieving symptoms such as shortness of breath, decreased exercise tolerance, and extremity edema.    Expected Outcomes Improve functional capacity of life;Short term: Attendance in program 2-3 days a week with increased exercise capacity. Reported lower sodium intake. Reported increased fruit and vegetable intake. Reports medication compliance.;Short term: Daily weights obtained and reported for increase. Utilizing diuretic protocols set by physician.;Long term: Adoption of self-care skills and reduction of barriers for early signs and symptoms recognition and intervention leading to self-care maintenance.    Hypertension Yes    Intervention Provide education on lifestyle modifcations including regular physical activity/exercise, weight management, moderate sodium restriction and increased consumption of fresh fruit, vegetables, and low fat dairy, alcohol moderation, and smoking cessation.;Monitor prescription use compliance.    Expected Outcomes Short Term: Continued assessment and intervention until BP is < 140/70mm HG in hypertensive participants. < 130/13mm HG in hypertensive participants with diabetes, heart failure or chronic kidney disease.;Long Term: Maintenance of blood pressure at goal levels.    Lipids Yes    Intervention Provide education and support for participant on nutrition & aerobic/resistive exercise along with prescribed medications to achieve LDL 70mg , HDL >40mg .    Expected Outcomes Short Term: Participant states understanding of desired cholesterol values and is compliant with medications prescribed. Participant is following exercise prescription and nutrition guidelines.;Long Term: Cholesterol controlled with medications as prescribed, with individualized exercise RX and with personalized nutrition plan. Value goals:  LDL < 70mg , HDL > 40 mg.             Education:Diabetes - Individual verbal and written instruction to review signs/symptoms of diabetes, desired ranges of glucose level fasting, after meals and with exercise. Acknowledge that pre and post exercise glucose checks will be done for 3 sessions at entry of program. Candace [name] from 10/08/2020 in [facility] Cardiac and Pulmonary Rehab  Date 08/01/20  Educator [facility]  Instruction Review Code 1- Verbalizes Understanding       Core Components/Risk Factors/Patient Goals Review:   Goals and Risk Factor Review     Row Name 10/01/20 0755             Core Components/Risk Factors/Patient Goals Review   Personal Goals Review Weight Management/Obesity;Hypertension;Diabetes;Lipids;Heart Failure       Review Candace [name] is doing well in rehab.  Her weight is staying steady. She denies any heart failure symptoms currently.  Her sugars are doing well around 187. She is trying to get better with it and taking trulicity.  Her pressures are doing well and she checks them at home on occassion. We talked about making a habit of checking numbers again to be able to take to doctor.  Expected Outcomes Short: Start recording numbers at home Long: Continue to work on diabetes management.                Core Components/Risk Factors/Patient Goals at Discharge (Final Review):   Goals and Risk Factor Review - 10/01/20 0755       Core Components/Risk Factors/Patient Goals Review   Personal Goals Review Weight Management/Obesity;Hypertension;Diabetes;Lipids;Heart Failure    Review Candace [name] is doing well in rehab.  Her weight is staying steady. She denies any heart failure symptoms currently.  Her sugars are doing well around 187. She is trying to get better with it and taking trulicity.  Her pressures are doing well and she checks them at home on occassion. We talked about making a habit of checking numbers again to be able to take to doctor.    Expected  Outcomes Short: Start recording numbers at home Long: Continue to work on diabetes management.             ITP Comments:  ITP Comments     Row Name 08/01/20 1507 08/05/20 0903 08/13/20 0759 08/18/20 0857 08/20/20 1530   ITP Comments Initial telephone orientation completed. Diagnosis can be found in [facility] 4/13. EP orientation scheduled for Tuesday 5/10 at 8am. Completed 6MWT and gym orientation. Initial ITP created and sent for review to Dr. Emily Filbert, Medical Director. 30 Day review completed. Medical Director ITP review done, changes made as directed, and signed approval by Medical Director.   New to program First full day of exercise!  Patient was oriented to gym and equipment including functions, settings, policies, and procedures.  Patient's individual exercise prescription and treatment plan were reviewed.  All starting workloads were established based on the results of the 6 minute walk test done at initial orientation visit.  The plan for exercise progression was also introduced and progression will be customized based on patient's performance and goals.  Candace [name] has attended the program in the past Completed initial RD evaluation    Row Name 09/08/20 1628 09/10/20 0908 09/24/20 0845 10/08/20 1140     ITP Comments Unable to complete Candace [name]'s goals this review. Only attended 4 times since starting in May. 30 Day review completed. Medical Director ITP review done, changes made as directed, and signed approval by Medical Director. Pt completed 20 laps on the track and then needed to rest. Pt unable to continue exercising today due to 7/10 tightness in chest/chest pain. Pt BP checked, 122/76, and advised to take nitro per her prescription. Pt BP recheck at 92/70 with headache, nausea, and chest pain increasing per patient (pain described as stabbing and sharp at times, pressure, and discomfort). Pt was taken to emergency room by Cyril Mourning, RN, for further evaluation. 30 Day review completed. Medical  Director ITP review done, changes made as directed, and signed approval by Medical Director.             Comments:

## 2020-10-08 NOTE — Progress Notes (Signed)
Daily Session Note  Patient Details  Name: Candace [name] MRN: [id number] Date of Birth: 1950-06-02 Referring Provider:   Flowsheet Row Cardiac Rehab from 08/05/2020 in [facility] Cardiac and Pulmonary Rehab  Referring Provider [name]       Encounter Date: 10/08/2020  Check In:  Session Check In - 10/08/20 0829       Check-In   Supervising physician immediately available to respond to emergencies See telemetry face sheet for immediately available ER MD    Location [facility]-Cardiac & Pulmonary Rehab    Staff Present Birdie Sons, MPA, RN;Melissa [place], RDN, LDN;Jessica Luan Pulling, MA, RCEP, CCRP, CCET;Lether Tesch, RN, BSN, CCRP    Virtual Visit No    Medication changes reported     No    Fall or balance concerns reported    No    Warm-up and Cool-down Performed on first and last piece of equipment    Resistance Training Performed Yes    VAD Patient? No    PAD/SET Patient? No      Pain Assessment   Currently in Pain? No/denies                Social History   Tobacco Use  Smoking Status Former   Packs/day: 0.50   Pack years: 0.00   Types: Cigarettes   Quit date: 10/07/2001   Years since quitting: 19.0  Smokeless Tobacco Never    Goals Met:  Independence with exercise equipment Exercise tolerated well No report of cardiac concerns or symptoms  Goals Unmet:  Not Applicable  Comments: Pt able to follow exercise prescription today without complaint.  Will continue to monitor for progression.    Dr. Emily Filbert is Medical Director for [place].  Dr. Ottie Glazier is Medical Director for [facility] Pulmonary Rehabilitation.

## 2020-10-10 ENCOUNTER — Other Ambulatory Visit: Payer: Self-pay

## 2020-10-10 ENCOUNTER — Encounter: Payer: Medicare Other | Admitting: *Deleted

## 2020-10-10 ENCOUNTER — Telehealth: Payer: Self-pay

## 2020-10-10 DIAGNOSIS — I214 Non-ST elevation (NSTEMI) myocardial infarction: Secondary | ICD-10-CM

## 2020-10-10 DIAGNOSIS — I252 Old myocardial infarction: Secondary | ICD-10-CM | POA: Diagnosis not present

## 2020-10-10 NOTE — Progress Notes (Signed)
Daily Session Note  Patient Details  Name: Candace [name] MRN: [id number] Date of Birth: [birth year] Referring Provider:   Flowsheet Row Cardiac Rehab from 08/05/2020 in [facility] Cardiac and Pulmonary Rehab  Referring Provider [name]       Encounter Date: 10/10/2020  Check In:  Session Check In - 10/10/20 0828       Check-In   Supervising physician immediately available to respond to emergencies See telemetry face sheet for immediately available ER MD    Location [facility]-Cardiac & Pulmonary Rehab    Staff Present Heath Lark, RN, BSN, CCRP;Melissa [place], RDN, LDN;Joseph [place], [place]    Virtual Visit No    Medication changes reported     No    Fall or balance concerns reported    No    Warm-up and Cool-down Performed on first and last piece of equipment    Resistance Training Performed Yes    VAD Patient? No    PAD/SET Patient? No      Pain Assessment   Currently in Pain? No/denies                Social History   Tobacco Use  Smoking Status Former   Packs/day: 0.50   Types: Cigarettes   Quit date: 10/07/2001   Years since quitting: 19.0  Smokeless Tobacco Never    Goals Met:  Independence with exercise equipment Exercise tolerated well No report of cardiac concerns or symptoms  Goals Unmet:  Not Applicable  Comments: Pt able to follow exercise prescription today without complaint.  Will continue to monitor for progression.    Dr. Emily Filbert is Medical Director for [place].  Dr. Ottie Glazier is Medical Director for [facility] Pulmonary Rehabilitation.

## 2020-10-10 NOTE — Progress Notes (Signed)
Chronic Care Management Pharmacy Assistant   Name: Candace [name]  MRN: [id number] DOB: July 05, 1950  Reason for Encounter: Medication Review/Medication Coordination Call.   Recent office visits:  No recent Office Visit  Recent consult visits:  10/06/2020 Jettie Booze PA (Cardiology)  Hospital visits:  Medication Reconciliation was completed by comparing discharge summary, patient's EMR and Pharmacy list, and upon discussion with patient.   Admitted to the hospital on 10/05/2020 due to Chest Pain. Discharge date was 10/05/2020. Discharged from [facility]?Medications Started at [facility] Discharge:?? -started None  Medication Changes at Hospital Discharge: -Changed None  Medications Discontinued at Hospital Discharge: -Stopped None  Medications that remain the same after Hospital Discharge:??  -All other medications will remain the same.    Admitted to the hospital on 09/24/2020 due to Chest Pain. Discharge date was 09/24/2020. Discharged from [facility]?Medications Started at [facility] Discharge:?? -started None  Medication Changes at Hospital Discharge: -Changed None  Medications Discontinued at Hospital Discharge: -Stopped None  Medications that remain the same after Hospital Discharge:??  -All other medications will remain the same.    Medications: Outpatient Encounter Medications as of 10/10/2020  Medication Sig Note   acetaminophen (TYLENOL) 325 MG tablet Take 650 mg by mouth every 6 (six) hours as needed for moderate pain or headache.     bismuth subsalicylate (PEPTO BISMOL) 262 MG chewable tablet Chew 524 mg by mouth as needed.    clopidogrel (PLAVIX) 75 MG tablet Take 1 tablet (75 mg total) by mouth daily.    Dulaglutide (TRULICITY) 1.5 EQ/6.8TM SOPN Inject 1.5 mg into the skin once a week.    ELIQUIS 5 MG TABS tablet Take 5 mg by mouth 2 (two) times daily. 06/06/2020: Prescribed by Dr. Nehemiah Massed   ezetimibe  (ZETIA) 10 MG tablet Take 1 tablet (10 mg total) by mouth daily.    isosorbide mononitrate (IMDUR) 30 MG 24 hr tablet Take 30 mg by mouth daily.    JARDIANCE 25 MG TABS tablet TAKE ONE TABLET BY MOUTH BEFORE BREAKFAST    Lancets (ONETOUCH DELICA PLUS [id number]) MISC USE UP TO 4 TIMES DAILY AS DIRECTED    loperamide (IMODIUM) 2 MG capsule Take 2 mg by mouth as needed.    losartan (COZAAR) 25 MG tablet Take 1 tablet (25 mg total) by mouth daily.    metoprolol tartrate (LOPRESSOR) 100 MG tablet Take 100 mg by mouth 2 (two) times daily. 06/06/2020: Prescribed by Dr. Nehemiah Massed    nitroGLYCERIN (NITROSTAT) 0.4 MG SL tablet Place 1 tablet (0.4 mg total) under the tongue every 5 (five) minutes as needed for chest pain.    nystatin (NYSTATIN) powder Apply 1 application topically daily. (Patient taking differently: Apply 1 application topically daily as needed.)    ondansetron (ZOFRAN-ODT) 8 MG disintegrating tablet Take 1 tablet (8 mg total) by mouth every 8 (eight) hours as needed.    pantoprazole (PROTONIX) 20 MG tablet Take 1 tablet (20 mg total) by mouth 2 (two) times daily.    rosuvastatin (CRESTOR) 40 MG tablet Take 1 tablet (40 mg total) by mouth daily.    sertraline (ZOLOFT) 50 MG tablet Take 1 tablet (50 mg total) by mouth daily.    [DISCONTINUED] cetirizine (ZYRTEC) 5 MG tablet Take 2 tablets (10 mg total) by mouth daily.    No facility-administered encounter medications on file as of 10/10/2020.    Care Gaps: Mammogram COVID 19 Vaccine Ophthalmology Exam Tetanus Shingrix Star Rating  Drugs: 06/18/2020 Atorvastatin 40 mg last filled for a 30-day supply at [place] 09/30/2020 Rosuvastatin 40 mg last filled for a 90- Day supply at [place] 09/25/2020 Losartan 25 mg last filled for a 30-Day supply at [place] 09/25/2020 Jardiance 25 mg last filled for a 10-Day Supply at [facility] 26/20/3559 Trulicity 1.5 mg last filled for a 30-Day supply at [place] Gaps: None ID  Reviewed chart for medication changes ahead of medication coordination call.   BP Readings from Last 3 Encounters:  10/05/20 135/80  09/24/20 (!) 165/89  08/11/20 108/76    Lab Results  Component Value Date   HGBA1C 9.1 (H) 08/11/2020     Patient obtains medications through Adherence Packaging  30 Days   Last adherence delivery included per [facility]:  Trulicity 1.5 mg inject 1.5 mg once a week Isosorbide mononitrate 30 mg one tablet daily - Breakfast Pantoprazole 20 mg one tablet two times a day- breakfast, Evening meals Losartan 25 mg one tablet daily- Bedtime Eliquis 5 mg 1 tablet two times daily breakfast, evening meals Jardiance 25 mg 1 tablet daily before breakfast  Coordinated acute fill for Rosuvastatin to be delivered 09/30/2020 for 25 day supply. Coordinated acute fill for Metoprolol  to be delivered 09/03/2020 for 17 day supply.  Patient declined medications last month: ezetimibe 10 mg 1 tablet daily breakfast - (receive 90 day supply from [facility] on 07/13/2020) Clopidogrel 75 mg 1 tablet daily breakfast (receive 90 day supply from [facility] on 07/13/2020) Sertraline 50 mg 1 tablet daily breakfast - (receive 90 day supply from [facility] on 08/08/2020) Ondansetron 8 mg PRN Nitroglycerin 0.4 SL PRN  Patient is due for next adherence delivery on: 10/21/2020. Called patient and reviewed medications and coordinated delivery.  This delivery to include: Isosorbide mononitrate 30 mg one tablet daily - Breakfast Pantoprazole 20 mg one tablet two times a day- breakfast, Evening meals Losartan 25 mg one tablet daily- Bedtime Eliquis 5 mg 1 tablet two times daily breakfast, evening meals Jardiance 25 mg 1 tablet daily before breakfast  Coordinated acute fill for Trulicity 1.5 mg to be delivered 10/22/2020.  Patient declined the following medications: ezetimibe 10 mg 1 tablet daily breakfast - (receive 90 day  supply from [facility] on 09/22/2020) Clopidogrel 75 mg 1 tablet daily breakfast (receive 90 day supply from [facility] on 09/22/2020) Sertraline 50 mg 1 tablet daily breakfast - (receive 90 day supply from [facility] on 08/08/2020)  Patient needs refills for None ID.  Confirmed delivery date of 10/27/2020, advised patient that pharmacy will contact them the morning of delivery.  [place] Pharmacist Assistant [phone]

## 2020-10-13 ENCOUNTER — Encounter: Payer: Medicare Other | Admitting: *Deleted

## 2020-10-13 ENCOUNTER — Other Ambulatory Visit: Payer: Self-pay

## 2020-10-13 DIAGNOSIS — I214 Non-ST elevation (NSTEMI) myocardial infarction: Secondary | ICD-10-CM

## 2020-10-13 DIAGNOSIS — I252 Old myocardial infarction: Secondary | ICD-10-CM | POA: Diagnosis not present

## 2020-10-13 NOTE — Progress Notes (Signed)
Daily Session Note  Patient Details  Name: Candace [name] MRN: [id number] Date of Birth: 09-06-1950 Referring Provider:   Flowsheet Row Cardiac Rehab from 08/05/2020 in [facility] Cardiac and Pulmonary Rehab  Referring Provider [name]       Encounter Date: 10/13/2020  Check In:  Session Check In - 10/13/20 0922       Check-In   Supervising physician immediately available to respond to emergencies See telemetry face sheet for immediately available ER MD    Location [facility]-Cardiac & Pulmonary Rehab    Staff Present Heath Lark, RN, BSN, Laveda Norman, BS, ACSM CEP, Exercise Physiologist;Joseph [place], [place]    Virtual Visit No    Medication changes reported     No    Fall or balance concerns reported    No    Warm-up and Cool-down Performed on first and last piece of equipment    Resistance Training Performed Yes    VAD Patient? No    PAD/SET Patient? No      Pain Assessment   Currently in Pain? No/denies                Social History   Tobacco Use  Smoking Status Former   Packs/day: 0.50   Types: Cigarettes   Quit date: 10/07/2001   Years since quitting: 19.0  Smokeless Tobacco Never    Goals Met:  Independence with exercise equipment Exercise tolerated well No report of cardiac concerns or symptoms  Goals Unmet:  Not Applicable  Comments: Pt able to follow exercise prescription today without complaint.  Will continue to monitor for progression.    Dr. Emily Filbert is Medical Director for [place].  Dr. Ottie Glazier is Medical Director for [facility] Pulmonary Rehabilitation.

## 2020-10-15 ENCOUNTER — Other Ambulatory Visit: Payer: Self-pay

## 2020-10-15 DIAGNOSIS — I252 Old myocardial infarction: Secondary | ICD-10-CM | POA: Diagnosis not present

## 2020-10-15 DIAGNOSIS — I214 Non-ST elevation (NSTEMI) myocardial infarction: Secondary | ICD-10-CM

## 2020-10-15 NOTE — Progress Notes (Signed)
Daily Session Note  Patient Details  Name: Candace [name] MRN: [id number] Date of Birth: Jun 20, 1950 Referring Provider:   Flowsheet Row Cardiac Rehab from 08/05/2020 in [facility] Cardiac and Pulmonary Rehab  Referring Provider [name]       Encounter Date: 10/15/2020  Check In:  Session Check In - 10/15/20 0742       Check-In   Supervising physician immediately available to respond to emergencies See telemetry face sheet for immediately available ER MD    Location [facility]-Cardiac & Pulmonary Rehab    Staff Present Birdie Sons, MPA, Elveria Rising, BA, ACSM CEP, Exercise Physiologist;Joseph Tessie Fass, [place]    Virtual Visit No    Medication changes reported     No    Fall or balance concerns reported    No    Warm-up and Cool-down Performed on first and last piece of equipment    Resistance Training Performed Yes    VAD Patient? No    PAD/SET Patient? No      Pain Assessment   Currently in Pain? No/denies                Social History   Tobacco Use  Smoking Status Former   Packs/day: 0.50   Types: Cigarettes   Quit date: 10/07/2001   Years since quitting: 19.0  Smokeless Tobacco Never    Goals Met:  Independence with exercise equipment Exercise tolerated well No report of cardiac concerns or symptoms Strength training completed today  Goals Unmet:  Not Applicable  Comments: Pt able to follow exercise prescription today without complaint.  Will continue to monitor for progression.    Dr. Emily Filbert is Medical Director for [place].  Dr. Ottie Glazier is Medical Director for [facility] Pulmonary Rehabilitation.

## 2020-10-17 ENCOUNTER — Encounter: Payer: Medicare Other | Admitting: *Deleted

## 2020-10-17 ENCOUNTER — Other Ambulatory Visit: Payer: Self-pay

## 2020-10-17 DIAGNOSIS — I252 Old myocardial infarction: Secondary | ICD-10-CM | POA: Diagnosis not present

## 2020-10-17 DIAGNOSIS — I214 Non-ST elevation (NSTEMI) myocardial infarction: Secondary | ICD-10-CM

## 2020-10-17 NOTE — Progress Notes (Signed)
Daily Session Note  Patient Details  Name: Candace [name] MRN: [id number] Date of Birth: 1950-08-29 Referring Provider:   Flowsheet Row Cardiac Rehab from 08/05/2020 in [facility] Cardiac and Pulmonary Rehab  Referring Provider [name]       Encounter Date: 10/17/2020  Check In:      Social History   Tobacco Use  Smoking Status Former   Packs/day: 0.50   Types: Cigarettes   Quit date: 10/07/2001   Years since quitting: 19.0  Smokeless Tobacco Never    Goals Met:  Independence with exercise equipment Exercise tolerated well No report of cardiac concerns or symptoms  Goals Unmet:  Not Applicable  Comments: Pt able to follow exercise prescription today without complaint.  Will continue to monitor for progression.    Dr. Emily Filbert is Medical Director for [place].  Dr. Ottie Glazier is Medical Director for [facility] Pulmonary Rehabilitation.

## 2020-10-20 ENCOUNTER — Other Ambulatory Visit: Payer: Self-pay | Admitting: *Deleted

## 2020-10-20 ENCOUNTER — Ambulatory Visit: Payer: Self-pay

## 2020-10-20 DIAGNOSIS — R197 Diarrhea, unspecified: Secondary | ICD-10-CM

## 2020-10-20 DIAGNOSIS — K921 Melena: Secondary | ICD-10-CM | POA: Diagnosis not present

## 2020-10-20 DIAGNOSIS — R1110 Vomiting, unspecified: Secondary | ICD-10-CM

## 2020-10-20 NOTE — Telephone Encounter (Signed)
Pt. Reports she has "black stool this morning." No abdominal pain. Has vomited x 2 and "I just don't feel good." Takes Plavix. No availability in the practice today. Pt. Would like to be "worked in if possible." Please advise.

## 2020-10-20 NOTE — Telephone Encounter (Signed)
Reason for Disposition  [1] Abnormal color is unexplained AND [2] persists > 24 hours  Answer Assessment - Initial Assessment Questions 1. COLOR: "What color is it?" "Is that color in part or all of the stool?"     Black 2. ONSET: "When was the unusual color first noted?"     This morning 3. CAUSE: "Have you eaten any food or taken any medicine of this color?" (See listing in BACKGROUND)     No 4. OTHER SYMPTOMS: "Do you have any other symptoms?" (e.g., diarrhea, jaundice, abdominal pain, fever).     Vomited x 2 today.  Protocols used: Stools - Unusual Color-A-AH

## 2020-10-20 NOTE — Telephone Encounter (Signed)
I have no openings.  Get a CBC today along with a met C.  Stop Plavix and Eliquis until we get the lab work back. Otherwise I would recommend urgent care or [facility] appt.

## 2020-10-20 NOTE — Telephone Encounter (Signed)
Please advise 

## 2020-10-20 NOTE — Telephone Encounter (Signed)
Patient was advised. Labs ordered. Patient is already taking PPI pantoprazole 20 bid qd.

## 2020-10-21 LAB — COMPREHENSIVE METABOLIC PANEL
ALT: 27 IU/L (ref 0–32)
AST: 20 IU/L (ref 0–40)
Albumin/Globulin Ratio: 2.2 (ref 1.2–2.2)
Albumin: 4.3 g/dL (ref 3.8–4.8)
Alkaline Phosphatase: 122 IU/L — ABNORMAL HIGH (ref 44–121)
BUN/Creatinine Ratio: 7 — ABNORMAL LOW (ref 12–28)
BUN: 9 mg/dL (ref 8–27)
Bilirubin Total: 0.2 mg/dL (ref 0.0–1.2)
CO2: 22 mmol/L (ref 20–29)
Calcium: 9.8 mg/dL (ref 8.7–10.3)
Chloride: 102 mmol/L (ref 96–106)
Creatinine, Ser: 1.24 mg/dL — ABNORMAL HIGH (ref 0.57–1.00)
Globulin, Total: 2 g/dL (ref 1.5–4.5)
Glucose: 280 mg/dL — ABNORMAL HIGH (ref 65–99)
Potassium: 4 mmol/L (ref 3.5–5.2)
Sodium: 139 mmol/L (ref 134–144)
Total Protein: 6.3 g/dL (ref 6.0–8.5)
eGFR: 47 mL/min/{1.73_m2} — ABNORMAL LOW (ref >59)

## 2020-10-21 LAB — CBC WITH DIFFERENTIAL/PLATELET
Basophils Absolute: 0 10*3/uL (ref 0.0–0.2)
Basos: 1 %
EOS (ABSOLUTE): 0.1 10*3/uL (ref 0.0–0.4)
Eos: 2 %
Hematocrit: 38.6 % (ref 34.0–46.6)
Hemoglobin: 13.2 g/dL (ref 11.1–15.9)
Immature Grans (Abs): 0 10*3/uL (ref 0.0–0.1)
Immature Granulocytes: 0 %
Lymphocytes Absolute: 2.6 10*3/uL (ref 0.7–3.1)
Lymphs: 37 %
MCH: 32.4 pg (ref 26.6–33.0)
MCHC: 34.2 g/dL (ref 31.5–35.7)
MCV: 95 fL (ref 79–97)
Monocytes Absolute: 0.6 10*3/uL (ref 0.1–0.9)
Monocytes: 8 %
Neutrophils Absolute: 3.7 10*3/uL (ref 1.4–7.0)
Neutrophils: 52 %
Platelets: 191 10*3/uL (ref 150–450)
RBC: 4.08 x10E6/uL (ref 3.77–5.28)
RDW: 12.3 % (ref 11.7–15.4)
WBC: 7 10*3/uL (ref 3.4–10.8)

## 2020-10-22 ENCOUNTER — Other Ambulatory Visit: Payer: Self-pay

## 2020-10-22 DIAGNOSIS — I252 Old myocardial infarction: Secondary | ICD-10-CM | POA: Diagnosis not present

## 2020-10-22 DIAGNOSIS — I214 Non-ST elevation (NSTEMI) myocardial infarction: Secondary | ICD-10-CM

## 2020-10-22 NOTE — Progress Notes (Signed)
Daily Session Note  Patient Details  Name: Candace [name] MRN: [id number] Date of Birth: 1950/06/23 Referring Provider:   Flowsheet Row Cardiac Rehab from 08/05/2020 in [facility] Cardiac and Pulmonary Rehab  Referring Provider [name]       Encounter Date: 10/22/2020  Check In:  Session Check In - 10/22/20 0750       Check-In   Supervising physician immediately available to respond to emergencies See telemetry face sheet for immediately available ER MD    Location [facility]-Cardiac & Pulmonary Rehab    Staff Present Birdie Sons, MPA, Elveria Rising, BA, ACSM CEP, Exercise Physiologist;Joseph Tessie Fass, [place]    Virtual Visit No    Medication changes reported     No    Fall or balance concerns reported    No    Warm-up and Cool-down Performed on first and last piece of equipment    Resistance Training Performed Yes    VAD Patient? No    PAD/SET Patient? No      Pain Assessment   Currently in Pain? No/denies                Social History   Tobacco Use  Smoking Status Former   Packs/day: 0.50   Types: Cigarettes   Quit date: 10/07/2001   Years since quitting: 19.0  Smokeless Tobacco Never    Goals Met:  Independence with exercise equipment Exercise tolerated well No report of cardiac concerns or symptoms Strength training completed today  Goals Unmet:  Not Applicable  Comments: Pt able to follow exercise prescription today without complaint.  Will continue to monitor for progression.    Dr. Emily Filbert is Medical Director for [place].  Dr. Ottie Glazier is Medical Director for [facility] Pulmonary Rehabilitation.

## 2020-10-23 ENCOUNTER — Telehealth: Payer: Self-pay

## 2020-10-24 ENCOUNTER — Ambulatory Visit: Payer: Self-pay

## 2020-10-24 ENCOUNTER — Telehealth: Payer: Medicaid Other

## 2020-10-24 ENCOUNTER — Other Ambulatory Visit: Payer: Self-pay

## 2020-10-24 ENCOUNTER — Encounter: Payer: Medicare Other | Admitting: *Deleted

## 2020-10-24 DIAGNOSIS — I214 Non-ST elevation (NSTEMI) myocardial infarction: Secondary | ICD-10-CM

## 2020-10-24 DIAGNOSIS — I5032 Chronic diastolic (congestive) heart failure: Secondary | ICD-10-CM

## 2020-10-24 DIAGNOSIS — I252 Old myocardial infarction: Secondary | ICD-10-CM | POA: Diagnosis not present

## 2020-10-24 NOTE — Progress Notes (Signed)
Daily Session Note  Patient Details  Name: SHARYL PANCHAL MRN: [id number] Date of Birth: 01-Sep-1950 Referring Provider:   Flowsheet Row Cardiac Rehab from 08/05/2020 in [facility] Cardiac and Pulmonary Rehab  Referring Provider [name]       Encounter Date: 10/24/2020  Check In:  Session Check In - 10/24/20 0809       Check-In   Supervising physician immediately available to respond to emergencies See telemetry face sheet for immediately available ER MD    Location [facility]-Cardiac & Pulmonary Rehab    Staff Present Heath Lark, RN, BSN, CCRP;Joseph [place], RCP,RRT,BSRT;Amanda Pray, [place], ACSM CEP, Exercise Physiologist    Virtual Visit No    Medication changes reported     No    Fall or balance concerns reported    No    Warm-up and Cool-down Performed on first and last piece of equipment    Resistance Training Performed Yes    VAD Patient? No    PAD/SET Patient? No      Pain Assessment   Currently in Pain? No/denies                Social History   Tobacco Use  Smoking Status Former   Packs/day: 0.50   Types: Cigarettes   Quit date: 10/07/2001   Years since quitting: 19.0  Smokeless Tobacco Never    Goals Met:  Independence with exercise equipment Exercise tolerated well No report of cardiac concerns or symptoms  Goals Unmet:  Not Applicable  Comments: Pt able to follow exercise prescription today without complaint.  Will continue to monitor for progression.    Dr. Emily Filbert is Medical Director for [place].  Dr. Ottie Glazier is Medical Director for [facility] Pulmonary Rehabilitation.

## 2020-10-24 NOTE — Chronic Care Management (AMB) (Signed)
   10/24/2020  Candace [name] 04-10-1950 [id number]   Message received from Ms. Otoole requesting assistance with care coordination. Returned call, however she was not available. Left a message to call when she is available.    PLAN Pending return call.     [name] Care Management [facility] [phone]

## 2020-10-27 ENCOUNTER — Encounter: Payer: Medicare Other | Attending: Internal Medicine | Admitting: *Deleted

## 2020-10-27 ENCOUNTER — Other Ambulatory Visit: Payer: Self-pay

## 2020-10-27 DIAGNOSIS — E1140 Type 2 diabetes mellitus with diabetic neuropathy, unspecified: Secondary | ICD-10-CM | POA: Diagnosis not present

## 2020-10-27 DIAGNOSIS — L03032 Cellulitis of left toe: Secondary | ICD-10-CM | POA: Diagnosis not present

## 2020-10-27 DIAGNOSIS — I214 Non-ST elevation (NSTEMI) myocardial infarction: Secondary | ICD-10-CM | POA: Diagnosis not present

## 2020-10-27 DIAGNOSIS — L600 Ingrowing nail: Secondary | ICD-10-CM | POA: Diagnosis not present

## 2020-10-27 DIAGNOSIS — B351 Tinea unguium: Secondary | ICD-10-CM | POA: Diagnosis not present

## 2020-10-27 NOTE — Progress Notes (Signed)
Daily Session Note  Patient Details  Name: Candace [name] MRN: [id number] Date of Birth: 11-20-1950 Referring Provider:   Flowsheet Row Cardiac Rehab from 08/05/2020 in [facility] Cardiac and Pulmonary Rehab  Referring Provider [name]       Encounter Date: 10/27/2020  Check In:  Session Check In - 10/27/20 0830       Check-In   Supervising physician immediately available to respond to emergencies See telemetry face sheet for immediately available ER MD    Location [facility]-Cardiac & Pulmonary Rehab    Staff Present Heath Lark, RN, BSN, Laveda Norman, BS, ACSM CEP, Exercise Physiologist;Joseph [place], [place]    Virtual Visit No    Medication changes reported     No    Fall or balance concerns reported    No    Warm-up and Cool-down Performed on first and last piece of equipment    Resistance Training Performed Yes    VAD Patient? No    PAD/SET Patient? No      Pain Assessment   Currently in Pain? No/denies                Social History   Tobacco Use  Smoking Status Former   Packs/day: 0.50   Types: Cigarettes   Quit date: 10/07/2001   Years since quitting: 19.0  Smokeless Tobacco Never    Goals Met:  Independence with exercise equipment Exercise tolerated well No report of cardiac concerns or symptoms  Goals Unmet:  Not Applicable  Comments: Pt able to follow exercise prescription today without complaint.  Will continue to monitor for progression.  Candace [name] has a swollen and sore toe. She is limiting exercise to non weight bearing on that foot. She will call and get an appointment with a provider after this session.    Dr. Emily Filbert is Medical Director for [place].  Dr. Ottie Glazier is Medical Director for [facility] Pulmonary Rehabilitation.

## 2020-10-27 NOTE — Progress Notes (Deleted)
Daily Session Note  Patient Details  Name: Candace [name] MRN: [id number] Date of Birth: 01/15/1951 Referring Provider:   Flowsheet Row Cardiac Rehab from 08/05/2020 in [facility] Cardiac and Pulmonary Rehab  Referring Provider Kowalski, Bruce MD       Encounter Date: 10/27/2020  Check In:      Social History   Tobacco Use  Smoking Status Former   Packs/day: 0.50   Types: Cigarettes   Quit date: 10/07/2001   Years since quitting: 19.0  Smokeless Tobacco Never    Goals Met:  Independence with exercise equipment Exercise tolerated well No report of cardiac concerns or symptoms  Goals Unmet:  Not Applicable  Comments: Pt able to follow exercise prescription today without complaint.  Will continue to monitor for progression. [name] has a swollen and sore toe. She is limiting exercise to non weight bearing on that foot. She will call and get an appointment with a provider after this session.   Dr. Mark Miller is Medical Director for [facility] Cardiac Rehabilitation.  Dr. Fuad Aleskerov is Medical Director for [facility] Pulmonary Rehabilitation. 

## 2020-10-29 ENCOUNTER — Other Ambulatory Visit: Payer: Self-pay

## 2020-10-29 DIAGNOSIS — I214 Non-ST elevation (NSTEMI) myocardial infarction: Secondary | ICD-10-CM | POA: Diagnosis not present

## 2020-10-29 NOTE — Progress Notes (Signed)
Daily Session Note  Patient Details  Name: Candace [name] MRN: [id number] Date of Birth: July 11, 1950 Referring Provider:   Flowsheet Row Cardiac Rehab from 08/05/2020 in [facility] Cardiac and Pulmonary Rehab  Referring Provider [name]       Encounter Date: 10/29/2020  Check In:  Session Check In - 10/29/20 0935       Check-In   Supervising physician immediately available to respond to emergencies See telemetry face sheet for immediately available ER MD    Location [facility]-Cardiac & Pulmonary Rehab    Staff Present Hope Budds, RDN, Rowe Pavy, BA, ACSM CEP, Exercise Physiologist;Clea Dubach, RN,BC,MSN    Virtual Visit No    Medication changes reported     Yes    Comments Doxycycline added    Fall or balance concerns reported    No    Warm-up and Cool-down Performed on first and last piece of equipment    Resistance Training Performed Yes    VAD Patient? No    PAD/SET Patient? No      Pain Assessment   Currently in Pain? No/denies    Multiple Pain Sites No                Social History   Tobacco Use  Smoking Status Former   Packs/day: 0.50   Types: Cigarettes   Quit date: 10/07/2001   Years since quitting: 19.0  Smokeless Tobacco Never    Goals Met:  Independence with exercise equipment Exercise tolerated well No report of cardiac concerns or symptoms  Goals Unmet:  Not Applicable  Comments: Pt able to follow exercise prescription today without complaint.  Will continue to monitor for progression.    Dr. Emily Filbert is Medical Director for [place].  Dr. Ottie Glazier is Medical Director for [facility] Pulmonary Rehabilitation.

## 2020-10-31 ENCOUNTER — Encounter: Payer: Medicare Other | Admitting: *Deleted

## 2020-10-31 ENCOUNTER — Ambulatory Visit (INDEPENDENT_AMBULATORY_CARE_PROVIDER_SITE_OTHER): Payer: Medicare Other

## 2020-10-31 ENCOUNTER — Other Ambulatory Visit: Payer: Self-pay

## 2020-10-31 DIAGNOSIS — I214 Non-ST elevation (NSTEMI) myocardial infarction: Secondary | ICD-10-CM

## 2020-10-31 DIAGNOSIS — I5032 Chronic diastolic (congestive) heart failure: Secondary | ICD-10-CM

## 2020-10-31 NOTE — Patient Instructions (Addendum)
Thank you for allowing the Chronic Care Management team to participate in your care.    Patient Care Plan: Heart Failure (Adult)     Problem Identified: Symptom Exacerbation (Heart Failure)   Priority: High  Onset Date: 04/15/2020     Long-Range Goal: Symptom Exacerbation Prevented or Minimized   Start Date: 09/09/2020  Expected End Date: 01/07/2021  Recent Progress: On track  Priority: High  Note:    Current Barriers:  Chronic Disease Management support and educational needs r/t CHF.  Case Manager Clinical Goal(s):  Over the next 120 days, patient will not require hospitalization or emergent evaluation d/t complications r/t CHF exacerbation.  Interventions:  Collaboration with [name]., MD regarding development and update of comprehensive plan of care as evidenced by provider attestation and co-signature Inter-disciplinary care team collaboration (see longitudinal plan of care) Reviewed medications and current plan for CHF management. Reports excellent compliance with treatment plan. Reports completing last session of Cardiac Rehab today. Reports weighing as advised. Weights have been within the established range. Denies edema or s/sx of fluid overload.  Reviewed worsening symptoms r/t CHF exacerbation and indications for seeking immediate medical attention.     Patient Goals/Self-Care Activities:  Take medications as prescribed Follow plan for CHF symptom management and notify provider with concerns Monitor and record weights Adhere to recommended cardiac diet Notify provider or care management team with questions or new concerns as needed    Follow Up Plan:  Will follow up next month      Ms. Busbin verbalized understanding of the information discussed during the telephonic outreach today. Declined need for mailed/printed instructions. A member of the care management team will follow up within the next month.   [name]  Care Management [facility] [phone]

## 2020-10-31 NOTE — Progress Notes (Signed)
Daily Session Note  Patient Details  Name: Candace [name] MRN: [id number] Date of Birth: [birth year] Referring Provider:   Flowsheet Row Cardiac Rehab from 08/05/2020 in [facility] Cardiac and Pulmonary Rehab  Referring Provider [name]       Encounter Date: 10/31/2020  Check In:  Session Check In - 10/31/20 0821       Check-In   Supervising physician immediately available to respond to emergencies See telemetry face sheet for immediately available ER MD    Location [facility]-Cardiac & Pulmonary Rehab    Staff Present Heath Lark, RN, BSN, CCRP;Joseph [place], RCP,RRT,BSRT;Jessica [place], [place], [place], CCRP, CCET    Virtual Visit No    Medication changes reported     No    Fall or balance concerns reported    No    Warm-up and Cool-down Performed on first and last piece of equipment    Resistance Training Performed Yes    VAD Patient? No    PAD/SET Patient? No      Pain Assessment   Currently in Pain? No/denies                Social History   Tobacco Use  Smoking Status Former   Packs/day: 0.50   Types: Cigarettes   Quit date: 10/07/2001   Years since quitting: 19.0  Smokeless Tobacco Never    Goals Met:  Independence with exercise equipment Exercise tolerated well No report of cardiac concerns or symptoms  Goals Unmet:  Not Applicable  Comments:  Candace [name] graduated today from  rehab with 30 sessions completed.  Details of the patient's exercise prescription and what She needs to do in order to continue the prescription and progress were discussed with patient.  Patient was given a copy of prescription and goals.  Patient verbalized understanding.  Candace [name] plans to continue to exercise by walking at home.   Dr. Emily Filbert is Medical Director for [place].  Dr. Ottie Glazier is Medical Director for [facility] Pulmonary Rehabilitation.

## 2020-10-31 NOTE — Progress Notes (Signed)
Cardiac Individual Treatment Plan  Patient Details  Name: [name]-JANE NAZARIO MRN: [id number] Date of Birth: [birth year] Referring Provider:   Flowsheet Row Cardiac Rehab from 08/05/2020 in [facility] Cardiac and Pulmonary Rehab  Referring Provider [name]       Initial Encounter Date:  Flowsheet Row Cardiac Rehab from 08/05/2020 in [facility] Cardiac and Pulmonary Rehab  Date 08/05/20       Visit Diagnosis: NSTEMI (non-ST elevated myocardial infarction) [facility])  Patient's Home Medications on Admission:  Current Outpatient Medications:    acetaminophen (TYLENOL) 325 MG tablet, Take 650 mg by mouth every 6 (six) hours as needed for moderate pain or headache. , Disp: , Rfl:    bismuth subsalicylate (PEPTO BISMOL) 262 MG chewable tablet, Chew 524 mg by mouth as needed., Disp: , Rfl:    clopidogrel (PLAVIX) 75 MG tablet, Take 1 tablet (75 mg total) by mouth daily., Disp: 90 tablet, Rfl: 0   Dulaglutide (TRULICITY) 1.5 CL/2.7NT SOPN, Inject 1.5 mg into the skin once a week., Disp: 2 mL, Rfl: 2   ELIQUIS 5 MG TABS tablet, Take 5 mg by mouth 2 (two) times daily., Disp: , Rfl:    ezetimibe (ZETIA) 10 MG tablet, Take 1 tablet (10 mg total) by mouth daily., Disp: 90 tablet, Rfl: 0   isosorbide mononitrate (IMDUR) 30 MG 24 hr tablet, Take 30 mg by mouth daily., Disp: , Rfl:    JARDIANCE 25 MG TABS tablet, TAKE ONE TABLET BY MOUTH BEFORE BREAKFAST, Disp: 30 tablet, Rfl: 2   Lancets (ONETOUCH DELICA PLUS [id number]) MISC, USE UP TO 4 TIMES DAILY AS DIRECTED, Disp: 100 each, Rfl: 0   loperamide (IMODIUM) 2 MG capsule, Take 2 mg by mouth as needed., Disp: , Rfl:    losartan (COZAAR) 25 MG tablet, Take 1 tablet (25 mg total) by mouth daily., Disp: 90 tablet, Rfl: 0   metoprolol tartrate (LOPRESSOR) 100 MG tablet, Take 100 mg by mouth 2 (two) times daily., Disp: , Rfl:    nitroGLYCERIN (NITROSTAT) 0.4 MG SL tablet, Place 1 tablet (0.4 mg total) under the tongue every 5 (five) minutes as needed for chest  pain., Disp: 30 tablet, Rfl: 0   nystatin (NYSTATIN) powder, Apply 1 application topically daily. (Patient taking differently: Apply 1 application topically daily as needed.), Disp: 60 g, Rfl: 5   ondansetron (ZOFRAN-ODT) 8 MG disintegrating tablet, Take 1 tablet (8 mg total) by mouth every 8 (eight) hours as needed., Disp: 20 tablet, Rfl: 1   pantoprazole (PROTONIX) 20 MG tablet, Take 1 tablet (20 mg total) by mouth 2 (two) times daily., Disp: 60 tablet, Rfl: 11   rosuvastatin (CRESTOR) 40 MG tablet, Take 1 tablet (40 mg total) by mouth daily., Disp: 90 tablet, Rfl: 0   sertraline (ZOLOFT) 50 MG tablet, Take 1 tablet (50 mg total) by mouth daily., Disp: 90 tablet, Rfl: 1  Past Medical History: Past Medical History:  Diagnosis Date   Allergy    Anemia    Anxiety    Arrhythmia    Arthritis    Atrial fibrillation ([place])    Coronary artery disease    Depression    Diabetes mellitus without complication ([place])    Dyspnea    doe   Dysrhythmia    GERD (gastroesophageal reflux disease)    Headache    History of hiatal hernia    Hyperlipidemia    Hypertension    Myocardial infarction [facility])    2016, 04/2017   Myocardial infarction with cardiac rehabilitation (  [place])    MI 2016/ CABG 8/17    FINISHED CARDIAC REHAB 3 WEEKS AGO   Panic attack    Pneumonia    Reflux    Stroke ([place]) 2015   showed up on MRI; no weakness noted   TIA (transient ischemic attack)    Voice tremor     Tobacco Use: Social History   Tobacco Use  Smoking Status Former   Packs/day: 0.50   Types: Cigarettes   Quit date: 10/07/2001   Years since quitting: 19.0  Smokeless Tobacco Never    Labs: Recent Review Flowsheet Data     Labs for ITP Cardiac and Pulmonary Rehab Latest Ref Rng & Units 08/31/2019 09/04/2019 12/06/2019 05/01/2020 08/11/2020   Cholestrol 100 - 199 mg/dL 234(H) - - - 164   LDLCALC 0 - 99 mg/dL 163(H) - - - 90   HDL >39 mg/dL 49 - - - 57   Trlycerides 0 - 149 mg/dL 110 - - - 94   Hemoglobin A1c 4.8  - 5.6 % - 9.4(A) 12.4(A) 12.2(A) 9.1(H)   PHART 7.350 - 7.450 - - - - -   PCO2ART 35.0 - 45.0 mmHg - - - - -   HCO3 20.0 - 24.0 mEq/L - - - - -   TCO2 0 - 100 mmol/L - - - - -   O2SAT % - - - - -        Exercise Target Goals: Exercise Program Goal: Individual exercise prescription set using results from initial 6 min walk test and THRR while considering  patient's activity barriers and safety.   Exercise Prescription Goal: Initial exercise prescription builds to 30-45 minutes a day of aerobic activity, 2-3 days per week.  Home exercise guidelines will be given to patient during program as part of exercise prescription that the participant will acknowledge.   Education: Aerobic Exercise: - Group verbal and visual presentation on the components of exercise prescription. Introduces F.I.T.T principle from ACSM for exercise prescriptions.  Reviews F.I.T.T. principles of aerobic exercise including progression. Written material given at graduation. Flowsheet Row Cardiac Rehab from 10/22/2020 in [facility] Cardiac and Pulmonary Rehab  Education need identified 08/05/20  Date 09/17/20  Educator [facility]  Instruction Review Code 1- Verbalizes Understanding       Education: Resistance Exercise: - Group verbal and visual presentation on the components of exercise prescription. Introduces F.I.T.T principle from ACSM for exercise prescriptions  Reviews F.I.T.T. principles of resistance exercise including progression. Written material given at graduation.    Education: Exercise & Equipment Safety: - Individual verbal instruction and demonstration of equipment use and safety with use of the equipment. Flowsheet Row Cardiac Rehab from 10/22/2020 in [facility] Cardiac and Pulmonary Rehab  Date 08/05/20  Educator [facility]  Instruction Review Code 1- Verbalizes Understanding       Education: Exercise Physiology & General Exercise Guidelines: - Group verbal and written instruction with models to review the exercise  physiology of the cardiovascular system and associated critical values. Provides general exercise guidelines with specific guidelines to those with heart or lung disease.  Flowsheet Row Cardiac Rehab from 10/22/2020 in [place] Cardiac and Pulmonary Rehab  Date 09/10/20  Educator [facility]  Instruction Review Code 1- Verbalizes Understanding       Education: Flexibility, Balance, Mind/Body Relaxation: - Group verbal and visual presentation with interactive activity on the components of exercise prescription. Introduces F.I.T.T principle from ACSM for exercise prescriptions. Reviews F.I.T.T. principles of flexibility and balance exercise training including progression. Also discusses the  mind body connection.  Reviews various relaxation techniques to help reduce and manage stress (i.e. Deep breathing, progressive muscle relaxation, and visualization). Balance handout provided to take home. Written material given at graduation. Flowsheet Row Cardiac Rehab from 10/22/2020 in [facility] Cardiac and Pulmonary Rehab  Date 10/01/20  Educator [facility]  Instruction Review Code 1- Verbalizes Understanding       Activity Barriers & Risk Stratification:  Activity Barriers & Cardiac Risk Stratification - 08/05/20 0905       Activity Barriers & Cardiac Risk Stratification   Activity Barriers Back Problems;Deconditioning;Shortness of Breath;Muscular Weakness;Balance Concerns;History of Falls    Cardiac Risk Stratification High             6 Minute Walk:  6 Minute Walk     Row Name 08/05/20 0903 10/24/20 0905       6 Minute Walk   Phase Initial Discharge    Distance 930 feet 1245 feet    Distance % Change -- 33.8 %    Distance Feet Change -- 315 ft    Walk Time 5.53 minutes 6 minutes    # of Rest Breaks 1  28 sec 0    MPH 1.91 2.35    METS 2.58 3.3    RPE 14 12    Perceived Dyspnea  3 3    VO2 Peak 9.05 11.56    Symptoms Yes (comment) No    Comments chest tightness 7-10, legs aching (thighs and calves)  8/10, SOB, wobbly gait --    Resting HR 85 bpm 100 bpm    Resting BP 122/64 100/62    Resting Oxygen Saturation  96 % --    Exercise Oxygen Saturation  during 6 min walk 98 % --    Max Ex. HR 128 bpm 135 bpm    Max Ex. BP 156/74 166/66    2 Minute Post BP 126/70 --             Oxygen Initial Assessment:   Oxygen Re-Evaluation:   Oxygen Discharge (Final Oxygen Re-Evaluation):   Initial Exercise Prescription:  Initial Exercise Prescription - 08/05/20 0900       Date of Initial Exercise RX and Referring Provider   Date 08/05/20    Referring Provider [name]      NuStep   Level 2    SPM 80    Minutes 15    METs 2.5      Arm Ergometer   Level 1    Watts 25    RPM 25    Minutes 15    METs 2      Track   Laps 25    Minutes 15    METs 2.4      Prescription Details   Frequency (times per week) 3    Duration Progress to 30 minutes of continuous aerobic without signs/symptoms of physical distress      Intensity   THRR 40-80% of Max Heartrate 111-138    Ratings of Perceived Exertion 11-13    Perceived Dyspnea 0-4      Progression   Progression Continue to progress workloads to maintain intensity without signs/symptoms of physical distress.      Resistance Training   Training Prescription Yes    Weight 3 lb    Reps 10-15             Perform Capillary Blood Glucose checks as needed.  Exercise Prescription Changes:   Exercise Prescription Changes     Row  Name 08/05/20 0900 08/18/20 1200 09/02/20 1400 09/15/20 1100 09/30/20 1400     Response to Exercise   Blood Pressure (Admit) 122/64 100/58 120/68 108/68 112/66   Blood Pressure (Exercise) 156/74 142/80 162/88 124/78 122/76   Blood Pressure (Exit) 126/70 110/60 112/74 122/76 92/70   Heart Rate (Admit) 85 bpm 113 bpm 77 bpm 91 bpm 96 bpm   Heart Rate (Exercise) 128 bpm 162 bpm 126 bpm 139 bpm 140 bpm   Heart Rate (Exit) 76 bpm 109 bpm 77 bpm 104 bpm 78 bpm   Oxygen Saturation (Admit)  96 % -- -- -- --   Oxygen Saturation (Exercise) 98 % -- -- -- --   Rating of Perceived Exertion (Exercise) _0 Perceived Dyspnea (Exercise) 3 -- -- -- --   Symptoms wobbly gait, SOB, legs aching (8/10), chest tightness (7/10) high HR -- -- chest pain walking   Comments walk test results first day -- -- --   Duration -- Progress to 30 minutes of  aerobic without signs/symptoms of physical distress Continue with 30 min of aerobic exercise without signs/symptoms of physical distress. Continue with 30 min of aerobic exercise without signs/symptoms of physical distress. Continue with 30 min of aerobic exercise without signs/symptoms of physical distress.   Intensity -- THRR unchanged THRR unchanged THRR unchanged THRR unchanged     Progression   Progression -- Continue to progress workloads to maintain intensity without signs/symptoms of physical distress. Continue to progress workloads to maintain intensity without signs/symptoms of physical distress. Continue to progress workloads to maintain intensity without signs/symptoms of physical distress. Continue to progress workloads to maintain intensity without signs/symptoms of physical distress.   Average METs -- 2.6 2.85 2.6 2.74     Resistance Training   Training Prescription -- Yes Yes Yes Yes   Weight -- 3 lb 3 lb 3 lb 5 lb   Reps -- 10-15 10-15 10-15 10-15     Interval Training   Interval Training -- -- No No No     NuStep   Level -- -- 2 -- 2   Minutes -- -- 15 -- 15   METs -- -- 3.5 -- 3.6     Arm Ergometer   Level -- 1 -- 1.3 1.5   Minutes -- 15 -- 15 15   METs -- 2.71 -- 2.73 2     Track   Laps -- _1 Minutes -- _2 METs -- 2.4 2.2 2.45 2.63    Row Name 10/01/20 0700 10/15/20 1200 10/28/20 0700         Response to Exercise   Blood Pressure (Admit) -- -- 100/62     Blood Pressure (Exercise) -- -- 166/66     Blood Pressure (Exit) -- 110/70 100/62     Heart Rate (Admit) -- 87 bpm 101 bpm      Heart Rate (Exercise) -- 119 bpm 138 bpm     Heart Rate (Exit) -- 94 bpm 104 bpm     Rating of Perceived Exertion (Exercise) -- 15 12     Symptoms -- -- none     Duration -- Continue with 30 min of aerobic exercise without signs/symptoms of physical distress. Continue with 30 min of aerobic exercise without signs/symptoms of physical distress.     Intensity -- THRR unchanged THRR unchanged           Progression  Progression -- Continue to progress workloads to maintain intensity without signs/symptoms of physical distress. Continue to progress workloads to maintain intensity without signs/symptoms of physical distress.     Average METs -- 2.74 3.02           Resistance Training       Training Prescription -- Yes Yes     Weight -- 5 lb 5 lb     Reps -- 10-15 10-15           Interval Training       Interval Training -- No No           NuStep       Level -- -- 5     Minutes -- -- 15     METs -- -- 3.4           Arm Ergometer       Level -- 1.3 1.3     Minutes -- 15 15     METs -- 2.74 --           Track       Laps -- 30 30     Minutes -- 15 15     METs -- 2.63 2.63           Home Exercise Plan       Plans to continue exercise at Home (comment)  walking, dancing Home (comment)  walking, dancing Home (comment)  walking, dancing     Frequency Add 2 additional days to program exercise sessions. Add 2 additional days to program exercise sessions. Add 2 additional days to program exercise sessions.     Initial Home Exercises Provided 10/01/20 10/01/20 10/01/20             Exercise Comments:   Exercise Comments     Row Name 08/18/20 0857 09/24/20 0845 10/08/20 1142 10/31/20 0826     Exercise Comments First full day of exercise!  Patient was oriented to gym and equipment including functions, settings, policies, and procedures.  Patient's individual exercise prescription and treatment plan were reviewed.  All starting workloads were established based on the  results of the 6 minute walk test done at initial orientation visit.  The plan for exercise progression was also introduced and progression will be customized based on patient's performance and goals.  Nancy has attended the program in the past Pt completed 20 laps on the track and then needed to rest. Pt unable to continue exercising today due to 7/10 tightness in chest/chest pain. Pt BP checked, 122/76, and advised to take nitro per her prescription. Pt BP recheck at 92/70 with headache, nausea, and chest pain increasing per patient (pain described as stabbing and sharp at times, pressure, and discomfort). Pt was taken to emergency room by Cyril Mourning, RN, for further evaluation. Britzy's physician has released her to CR with no restrictions.   Phylisha stated that she was told it may be scar tissue causing the pain. Aniston graduated today from  rehab with 30 sessions completed.  Details of the patient's exercise prescription and what She needs to do in order to continue the prescription and progress were discussed with patient.  Patient was given a copy of prescription and goals.  Patient verbalized understanding.  Lakeshia plans to continue to exercise by walking at home.             Exercise Goals and Review:   Exercise Goals     Row Name 08/05/20 [phone]  Exercise Goals   Increase Physical Activity Yes       Intervention Provide advice, education, support and counseling about physical activity/exercise needs.;Develop an individualized exercise prescription for aerobic and resistive training based on initial evaluation findings, risk stratification, comorbidities and participant's personal goals.       Expected Outcomes Short Term: Attend rehab on a regular basis to increase amount of physical activity.;Long Term: Add in home exercise to make exercise part of routine and to increase amount of physical activity.;Long Term: Exercising regularly at least 3-5 days a week.       Increase Strength and Stamina  Yes       Intervention Provide advice, education, support and counseling about physical activity/exercise needs.;Develop an individualized exercise prescription for aerobic and resistive training based on initial evaluation findings, risk stratification, comorbidities and participant's personal goals.       Expected Outcomes Short Term: Increase workloads from initial exercise prescription for resistance, speed, and METs.;Short Term: Perform resistance training exercises routinely during rehab and add in resistance training at home;Long Term: Improve cardiorespiratory fitness, muscular endurance and strength as measured by increased METs and functional capacity (6MWT)       Able to understand and use rate of perceived exertion (RPE) scale Yes       Intervention Provide education and explanation on how to use RPE scale       Expected Outcomes Short Term: Able to use RPE daily in rehab to express subjective intensity level;Long Term:  Able to use RPE to guide intensity level when exercising independently       Able to understand and use Dyspnea scale Yes       Intervention Provide education and explanation on how to use Dyspnea scale       Expected Outcomes Short Term: Able to use Dyspnea scale daily in rehab to express subjective sense of shortness of breath during exertion;Long Term: Able to use Dyspnea scale to guide intensity level when exercising independently       Knowledge and understanding of Target Heart Rate Range (THRR) Yes       Intervention Provide education and explanation of THRR including how the numbers were predicted and where they are located for reference       Expected Outcomes Short Term: Able to use daily as guideline for intensity in rehab;Short Term: Able to state/look up THRR;Long Term: Able to use THRR to govern intensity when exercising independently       Able to check pulse independently Yes       Intervention Provide education and demonstration on how to check pulse in  carotid and radial arteries.;Review the importance of being able to check your own pulse for safety during independent exercise       Expected Outcomes Short Term: Able to explain why pulse checking is important during independent exercise;Long Term: Able to check pulse independently and accurately       Understanding of Exercise Prescription Yes       Intervention Provide education, explanation, and written materials on patient's individual exercise prescription       Expected Outcomes Short Term: Able to explain program exercise prescription;Long Term: Able to explain home exercise prescription to exercise independently                Exercise Goals Re-Evaluation :  Exercise Goals Re-Evaluation     Row Name 08/18/20 0857 09/02/20 1427 09/15/20 1158 09/30/20 1425 10/01/20 0737     Exercise Goal Re-Evaluation  Exercise Goals Review Knowledge and understanding of Target Heart Rate Range (THRR);Able to understand and use rate of perceived exertion (RPE) scale;Able to understand and use Dyspnea scale;Able to check pulse independently;Understanding of Exercise Prescription Increase Physical Activity;Increase Strength and Stamina;Understanding of Exercise Prescription Increase Physical Activity;Increase Strength and Stamina Increase Physical Activity;Increase Strength and Stamina;Understanding of Exercise Prescription Increase Physical Activity;Increase Strength and Stamina;Understanding of Exercise Prescription;Able to understand and use rate of perceived exertion (RPE) scale;Able to understand and use Dyspnea scale;Knowledge and understanding of Target Heart Rate Range (THRR);Able to check pulse independently   Comments Reviewed RPE and dyspnea scales, THR and program prescription with pt today.  Pt voiced understanding and was given a copy of goals to take home. Genette has missed her last two appointments.  She is up to 22 laps on the track.  We will encourage improved attendance and continue to  monitor her progress. Elliyah did 28 laps on the track today!  Staff will review home exercise soon so she can workout safelyy at home on days not at [facility]. Wylene had some chest pain last week that did not relieve with nitorglcerin and was taken to ED where it did resolve. She is up to 30 laps on the track.  We will continue to monitor her progress. Meelah is doing well in rehab.  She is pleased is with her progress.  She is doing better than she better expected. She is already walking at home and dancing around the house.  She is also using water bottle weights.  Updated home exercise plan from previous rehab. She has been stretching and it is helping her neck tightness.   Expected Outcomes Short: Use RPE daily to regulate intensity. Long: Follow program prescription in THR. Short: Improved attendance again Long: Continue to improve stamina Short: review home exercise Long: attend consistently Short: Increase NuStep Long: Continue to improve stamina Short: Continue to walk more at home, focus on HR Long: Continue to improve stamina    Row Name 10/15/20 1222 10/28/20 0712           Exercise Goal Re-Evaluation   Exercise Goals Review Increase Physical Activity;Increase Strength and Stamina Increase Physical Activity;Increase Strength and Stamina;Understanding of Exercise Prescription      Comments Taiya continues to do well. She walks up to 30 laps most sessions.  We will continue to monitor progress. Graceanne is nearing graduation.  She improved her post 6MWT by 315 ft!!  She plans to conitnue to exercise by walking and dancing at home.  We will continue to monitor her progress.      Expected Outcomes Short:  maintain consistent exercise Long: increase MET level Short: Continue to dance towards graduation Long:Continue to exercise independently               Discharge Exercise Prescription (Final Exercise Prescription Changes):  Exercise Prescription Changes - 10/28/20 0700       Response to Exercise   Blood  Pressure (Admit) 100/62    Blood Pressure (Exercise) 166/66    Blood Pressure (Exit) 100/62    Heart Rate (Admit) 101 bpm    Heart Rate (Exercise) 138 bpm    Heart Rate (Exit) 104 bpm    Rating of Perceived Exertion (Exercise) 12    Symptoms none    Duration Continue with 30 min of aerobic exercise without signs/symptoms of physical distress.    Intensity THRR unchanged      Progression   Progression Continue to progress workloads to maintain intensity  without signs/symptoms of physical distress.    Average METs 3.02      Resistance Training   Training Prescription Yes    Weight 5 lb    Reps 10-15      Interval Training   Interval Training No      NuStep   Level 5    Minutes 15    METs 3.4      Arm Ergometer   Level 1.3    Minutes 15      Track   Laps 30    Minutes 15    METs 2.63      Home Exercise Plan   Plans to continue exercise at Home (comment)   walking, dancing   Frequency Add 2 additional days to program exercise sessions.    Initial Home Exercises Provided 10/01/20             Nutrition:  Target Goals: Understanding of nutrition guidelines, daily intake of sodium <1550m, cholesterol <2042m calories 30% from fat and 7% or less from saturated fats, daily to have 5 or more servings of fruits and vegetables.  Education: All About Nutrition: -Group instruction provided by verbal, written material, interactive activities, discussions, models, and posters to present general guidelines for heart healthy nutrition including fat, fiber, MyPlate, the role of sodium in heart healthy nutrition, utilization of the nutrition label, and utilization of this knowledge for meal planning. Follow up email sent as well. Written material given at graduation. [facility]  Education need identified 08/05/20  Date 10/08/20  Educator [facility]ruction Review Code 1- Verbalizes Understanding        Biometrics:  Pre Biometrics - 08/05/20 0908       Pre Biometrics   Height 5' 2.1" (1.577 m)    Weight 166 lb (75.3 kg)    BMI (Calculated) 30.28    Single Leg Stand 0.8 seconds              Nutrition Therapy Plan and Nutrition Goals:  Nutrition Therapy & Goals - 08/20/20 1531       Nutrition Therapy   Diet Heart healthy, low Na, diabetes friendly eating.    Drug/Food Interactions Statins/Certain Fruits    Protein (specify units) 60g    Fiber 25 grams    Whole Grain Foods 3 servings    Saturated Fats 12 max. grams    Sodium 1.5 grams      Personal Nutrition Goals   Nutrition Goal ST: choose more non- starchy vegetables at meals, rinse off canned food with water and cloose low sodium or no salt added LT: balance meals using MyPlate method. limit salt intake to <1.5g/day    Comments B: yogurt with banana with water and medications S: walnuts or peanut butter or cheese crackers or glucerna L: salad and bottle of water and handful of walnuts or glucerna D:baked chicken, corn, cabbage, and dinner roll (did not finish). Meat (fish or shrimp, chicken, ground meat 1x/week, pork chop - typically meat is baked or missed in with other foods) and vegetable and CHO (mashed potatoes, corn, dinner rolls).  She mentioned she only has hot dogs 1x/month. She does not like collards or mixed green or eggplant or brussells sprouts. She eats lots of fruit. Discussed heart healhty eating and diabetes friendly eating.      Intervention Plan   Intervention Prescribe, educate and counsel regarding individualized specific dietary modifications aiming towards targeted core components such  as weight, hypertension, lipid management, diabetes, heart failure and other comorbidities.    Expected Outcomes Short Term Goal: Understand basic principles of dietary content, such as calories, fat, sodium, cholesterol and nutrients.;Short Term Goal: A plan has been developed with personal nutrition goals set during  dietitian appointment.;Long Term Goal: Adherence to prescribed nutrition plan.             Nutrition Assessments:  MEDIFICTS Score Key: ?70 Need to make dietary changes  40-70 Heart Healthy Diet ? 40 Therapeutic Level Cholesterol Diet  Flowsheet Row Cardiac Rehab from 10/29/2020 in [facility] Cardiac and Pulmonary Rehab  Picture Your Plate Total Score on Admission 65  Picture Your Plate Total Score on Discharge 51      Picture Your Plate Scores: <30 Unhealthy dietary pattern with much room for improvement. 41-50 Dietary pattern unlikely to meet recommendations for good health and room for improvement. 51-60 More healthful dietary pattern, with some room for improvement.  >60 Healthy dietary pattern, although there may be some specific behaviors that could be improved.    Nutrition Goals Re-Evaluation:  Nutrition Goals Re-Evaluation     [place] Name 10/01/20 [phone]             Goals   Nutrition Goal ST: choose more non- starchy vegetables at meals, rinse off canned food with water and cloose low sodium or no salt added LT: balance meals using MyPlate method. limit salt intake to <1.5g/day       Comment Megin is doing well with her diet.  She is trying to get a better variety of vegetables in her salad.  She is working on balancing her meals better.  She still cheats on occasion and had [employer] for dinner last night with her grandkids.  She is watching her sodium more closely.       Expected Outcome Short: Limit eating out more Long: Continue to focus on healthy eating and more variety                Nutrition Goals Discharge (Final Nutrition Goals Re-Evaluation):  Nutrition Goals Re-Evaluation - 10/01/20 0752       Goals   Nutrition Goal ST: choose more non- starchy vegetables at meals, rinse off canned food with water and cloose low sodium or no salt added LT: balance meals using MyPlate method. limit salt intake to <1.5g/day    Comment Gracey is doing well with her diet.  She is  trying to get a better variety of vegetables in her salad.  She is working on balancing her meals better.  She still cheats on occasion and had [employer] for dinner last night with her grandkids.  She is watching her sodium more closely.    Expected Outcome Short: Limit eating out more Long: Continue to focus on healthy eating and more variety             Psychosocial: Target Goals: Acknowledge presence or absence of significant depression and/or stress, maximize coping skills, provide positive support system. Participant is able to verbalize types and ability to use techniques and skills needed for reducing stress and depression.   Education: Stress, Anxiety, and Depression - Group verbal and visual presentation to define topics covered.  Reviews how body is impacted by stress, anxiety, and depression.  Also discusses healthy ways to reduce stress and to treat/manage anxiety and depression.  Written material given at graduation. Flowsheet Row Cardiac Rehab from 02/08/2018 in [place] Cardiac and Pulmonary Rehab  Date 12/28/17  Educator Lupita Leash  Instruction Review Code 5- Refused Teaching       Education: Sleep Hygiene -Provides group verbal and written instruction about how sleep can affect your health.  Define sleep hygiene, discuss sleep cycles and impact of sleep habits. Review good sleep hygiene tips.  Flowsheet Row Cardiac Rehab from 02/08/2018 in [facility] Cardiac and Pulmonary Rehab  Date 02/08/18  Educator [facility]  Instruction Review Code 1- Verbalizes Understanding       Initial Review & Psychosocial Screening:  Initial Psych Review & Screening - 08/01/20 1520       Initial Review   Current issues with History of Depression;Current Sleep Concerns;Current Stress Concerns    Source of Stress Concerns Chronic Illness;Unable to perform yard/household activities;Unable to participate in former interests or hobbies      [place]? Yes      Barriers    Psychosocial barriers to participate in program The patient should benefit from training in stress management and relaxation.;Psychosocial barriers identified (see note)      Screening Interventions   Interventions Encouraged to exercise;Provide feedback about the scores to participant;To provide support and resources with identified psychosocial needs    Expected Outcomes Short Term goal: Utilizing psychosocial counselor, staff and physician to assist with identification of specific Stressors or current issues interfering with healing process. Setting desired goal for each stressor or current issue identified.;Long Term Goal: Stressors or current issues are controlled or eliminated.;Long Term goal: The participant improves quality of Life and PHQ9 Scores as seen by post scores and/or verbalization of changes;Short Term goal: Identification and review with participant of any Quality of Life or Depression concerns found by scoring the questionnaire.             Quality of Life Scores:   Quality of Life - 10/29/20 0847       Quality of Life   Select Quality of Life      Quality of Life Scores   Health/Function Pre 17.32 %    Health/Function Post 21.46 %    Health/Function % Change 23.9 %    Socioeconomic Pre 18.21 %    Socioeconomic Post 21.25 %    Socioeconomic % Change  16.69 %    Psych/Spiritual Pre 23.79 %    Psych/Spiritual Post 24.21 %    Psych/Spiritual % Change 1.77 %    Family Pre 29.35 %    Family Post 30 %    Family % Change 2.21 %    GLOBAL Pre 20.44 %    GLOBAL Post 23.15 %    GLOBAL % Change 13.26 %            Scores of 19 and below usually indicate a poorer quality of life in these areas.  A difference of  2-3 points is a clinically meaningful difference.  A difference of 2-3 points in the total score of the Quality of Life Index has been associated with significant improvement in overall quality of life, self-image, physical symptoms, and general health in  studies assessing change in quality of life.  PHQ-9: Recent Review Flowsheet Data     Depression screen [facility] 2/9 10/29/2020 08/05/2020 04/15/2020 04/02/2020 02/06/2020   Decreased Interest 0 1 0 0 1   Down, Depressed, Hopeless 0 1 0 0 0   PHQ - 2 Score 0 2 0 0 1   Altered sleeping 1 1 - - 1   Tired, decreased energy 1 1 - - 1   Change in appetite  0 1 - - 1   Feeling bad or failure about yourself  0 0 - - 0   Trouble concentrating 0 0 - - 0   Moving slowly or fidgety/restless 1 0 - - 0   Suicidal thoughts 0 0 - - 0   PHQ-9 Score 3 5 - - 4   Difficult doing work/chores Not difficult at all Not difficult at all - - Not difficult at all      Interpretation of Total Score  Total Score Depression Severity:  1-4 = Minimal depression, 5-9 = Mild depression, 10-14 = Moderate depression, 15-19 = Moderately severe depression, 20-27 = Severe depression   Psychosocial Evaluation and Intervention:  Psychosocial Evaluation - 08/01/20 1520       Psychosocial Evaluation & Interventions   Interventions Stress management education;Relaxation education;Encouraged to exercise with the program and follow exercise prescription    Comments Jone is returning to the program after completing it in 2019. She has had a NSTEMI this time. She states that this last year has been filled with medical issues and she is finally starting to feel better. She just started back driving and is looking forward to picking up her grandkids from school. Her family is her support system and she cherishes them. She doesn't report any symptoms of depression today and states she is only focused on seeing the positive side of things. She is excited to come back to the program and see some familiar faces. She is really wanting to maintain her independence and she knows this is the best way to do that    Expected Outcomes Short: attend cardiac rehab for education and exercise. Long: Develop positive self care habits.    Continue Psychosocial  Services  Follow up required by staff             Psychosocial Re-Evaluation:  Psychosocial Re-Evaluation     [place] Name 10/01/20 [phone]             Psychosocial Re-Evaluation   Current issues with None Identified;Current Psychotropic Meds       Comments Enslee is doing well in rehab.  She is feeling good mentally and stays positive.  She gets to see her great grands and grandkids more frequently now.  They all keep her busy. She sleeps pretty good and better on the days she exercises. Overall, she is doing well and feels well managed.       Expected Outcomes Short: Continue to stay positive.  Long: Continue to stay active.       Interventions Encouraged to attend Cardiac Rehabilitation for the exercise       Continue Psychosocial Services  Follow up required by staff                Psychosocial Discharge (Final Psychosocial Re-Evaluation):  Psychosocial Re-Evaluation - 10/01/20 0749       Psychosocial Re-Evaluation   Current issues with None Identified;Current Psychotropic Meds    Comments Maui is doing well in rehab.  She is feeling good mentally and stays positive.  She gets to see her great grands and grandkids more frequently now.  They all keep her busy. She sleeps pretty good and better on the days she exercises. Overall, she is doing well and feels well managed.    Expected Outcomes Short: Continue to stay positive.  Long: Continue to stay active.    Interventions Encouraged to attend Cardiac Rehabilitation for the exercise    Continue Psychosocial Services  Follow up  required by staff             Vocational Rehabilitation: Provide vocational rehab assistance to qualifying candidates.   Vocational Rehab Evaluation & Intervention:  Vocational Rehab - 08/01/20 1520       Initial Vocational Rehab Evaluation & Intervention   Assessment shows need for Vocational Rehabilitation No             Education: Education Goals: Education classes will be provided on a  variety of topics geared toward better understanding of heart health and risk factor modification. Participant will state understanding/return demonstration of topics presented as noted by education test scores.  Learning Barriers/Preferences:  Learning Barriers/Preferences - 08/01/20 1519       Learning Barriers/Preferences   Learning Barriers None    Learning Preferences None             General Cardiac Education Topics:  AED/CPR: - Group verbal and written instruction with the use of models to demonstrate the basic use of the AED with the basic ABC's of resuscitation. Flowsheet Row Cardiac Rehab from 02/08/2018 in [facility] Cardiac and Pulmonary Rehab  Date 01/26/18  Educator MA  Instruction Review Code 1- Verbalizes Understanding       Anatomy and Cardiac Procedures: - Group verbal and visual presentation and models provide information about basic cardiac anatomy and function. Reviews the testing methods done to diagnose heart disease and the outcomes of the test results. Describes the treatment choices: Medical Management, Angioplasty, or Coronary Bypass Surgery for treating various heart conditions including Myocardial Infarction, Angina, Valve Disease, and Cardiac Arrhythmias.  Written material given at graduation. Flowsheet Row Cardiac Rehab from 10/22/2020 in [facility] Cardiac and Pulmonary Rehab  Education need identified 08/05/20       Medication Safety: - Group verbal and visual instruction to review commonly prescribed medications for heart and lung disease. Reviews the medication, class of the drug, and side effects. Includes the steps to properly store meds and maintain the prescription regimen.  Written material given at graduation. Flowsheet Row Cardiac Rehab from 10/22/2020 in [facility] Cardiac and Pulmonary Rehab  Date 10/15/20  Educator SB  Instruction Review Code 1- Verbalizes Understanding       Intimacy: - Group verbal instruction through game format to discuss  how heart and lung disease can affect sexual intimacy. Written material given at graduation.. Flowsheet Row Cardiac Rehab from 10/22/2020 in [facility] Cardiac and Pulmonary Rehab  Date 09/17/20  Educator [facility]  Instruction Review Code 1- Verbalizes Understanding       Know Your Numbers and Heart Failure: - Group verbal and visual instruction to discuss disease risk factors for cardiac and pulmonary disease and treatment options.  Reviews associated critical values for Overweight/Obesity, Hypertension, Cholesterol, and Diabetes.  Discusses basics of heart failure: signs/symptoms and treatments.  Introduces Heart Failure Zone chart for action plan for heart failure.  Written material given at graduation. Flowsheet Row Cardiac Rehab from 10/22/2020 in [facility] Cardiac and Pulmonary Rehab  Date 08/20/20  Educator [facility]  Instruction Review Code 1- Verbalizes Understanding       Infection Prevention: - Provides verbal and written material to individual with discussion of infection control including proper hand washing and proper equipment cleaning during exercise session. Flowsheet Row Cardiac Rehab from 10/22/2020 in [facility] Cardiac and Pulmonary Rehab  Date 08/05/20  Educator [facility]  Instruction Review Code 1- Verbalizes Understanding       Falls Prevention: - Provides verbal and written material to individual with discussion of falls prevention  and safety. Flowsheet Row Cardiac Rehab from 10/22/2020 in [facility] Cardiac and Pulmonary Rehab  Date 08/05/20  Educator [facility]  Instruction Review Code 1- Verbalizes Understanding       Other: -Provides group and verbal instruction on various topics (see comments)   Knowledge Questionnaire Score:  Knowledge Questionnaire Score - 10/29/20 0847       Knowledge Questionnaire Score   Pre Score 22/26 Education Focus: Angina, Nutrition, Exercise    Post Score 25/26             Core Components/Risk Factors/Patient Goals at Admission:  Personal Goals and Risk  Factors at Admission - 08/05/20 0910       Core Components/Risk Factors/Patient Goals on Admission    Weight Management Yes;Obesity;Weight Loss    Intervention Weight Management: Develop a combined nutrition and exercise program designed to reach desired caloric intake, while maintaining appropriate intake of nutrient and fiber, sodium and fats, and appropriate energy expenditure required for the weight goal.;Weight Management: Provide education and appropriate resources to help participant work on and attain dietary goals.;Weight Management/Obesity: Establish reasonable short term and long term weight goals.;Obesity: Provide education and appropriate resources to help participant work on and attain dietary goals.    Admit Weight 166 lb (75.3 kg)    Goal Weight: Short Term 160 lb (72.6 kg)    Goal Weight: Long Term 155 lb (70.3 kg)    Expected Outcomes Short Term: Continue to assess and modify interventions until short term weight is achieved;Long Term: Adherence to nutrition and physical activity/exercise program aimed toward attainment of established weight goal;Weight Loss: Understanding of general recommendations for a balanced deficit meal plan, which promotes 1-2 lb weight loss per week and includes a negative energy balance of [phone] kcal/d;Understanding recommendations for meals to include 15-35% energy as protein, 25-35% energy from fat, 35-60% energy from carbohydrates, less than 257m of dietary cholesterol, 20-35 gm of total fiber daily;Understanding of distribution of calorie intake throughout the day with the consumption of 4-5 meals/snacks    Diabetes Yes    Intervention Provide education about signs/symptoms and action to take for hypo/hyperglycemia.;Provide education about proper nutrition, including hydration, and aerobic/resistive exercise prescription along with prescribed medications to achieve blood glucose in normal ranges: Fasting glucose 65-99 mg/dL    Expected Outcomes Short  Term: Participant verbalizes understanding of the signs/symptoms and immediate care of hyper/hypoglycemia, proper foot care and importance of medication, aerobic/resistive exercise and nutrition plan for blood glucose control.;Long Term: Attainment of HbA1C < 7%.    Heart Failure Yes    Intervention Provide a combined exercise and nutrition program that is supplemented with education, support and counseling about heart failure. Directed toward relieving symptoms such as shortness of breath, decreased exercise tolerance, and extremity edema.    Expected Outcomes Improve functional capacity of life;Short term: Attendance in program 2-3 days a week with increased exercise capacity. Reported lower sodium intake. Reported increased fruit and vegetable intake. Reports medication compliance.;Short term: Daily weights obtained and reported for increase. Utilizing diuretic protocols set by physician.;Long term: Adoption of self-care skills and reduction of barriers for early signs and symptoms recognition and intervention leading to self-care maintenance.    Hypertension Yes    Intervention Provide education on lifestyle modifcations including regular physical activity/exercise, weight management, moderate sodium restriction and increased consumption of fresh fruit, vegetables, and low fat dairy, alcohol moderation, and smoking cessation.;Monitor prescription use compliance.    Expected Outcomes Short Term: Continued assessment and intervention until BP is < 140/952m  HG in hypertensive participants. < 130/8m HG in hypertensive participants with diabetes, heart failure or chronic kidney disease.;Long Term: Maintenance of blood pressure at goal levels.    Lipids Yes    Intervention Provide education and support for participant on nutrition & aerobic/resistive exercise along with prescribed medications to achieve LDL <766m HDL >4020m   Expected Outcomes Short Term: Participant states understanding of desired  cholesterol values and is compliant with medications prescribed. Participant is following exercise prescription and nutrition guidelines.;Long Term: Cholesterol controlled with medications as prescribed, with individualized exercise RX and with personalized nutrition plan. Value goals: LDL < 60m67mDL > 40 mg.             Education:Diabetes - Individual verbal and written instruction to review signs/symptoms of diabetes, desired ranges of glucose level fasting, after meals and with exercise. Acknowledge that pre and post exercise glucose checks will be done for 3 sessions at entry of program. [facility]  Date 08/01/20  [facility]- Verbalizes Understanding       Core Components/Risk Factors/Patient Goals Review:   Goals and Risk Factor Review     Row Name 10/01/20 0755             Core Components/Risk Factors/Patient Goals Review   Personal Goals Review Weight Management/Obesity;Hypertension;Diabetes;Lipids;Heart Failure       Review Mirjana Brittindoing well in rehab.  Her weight is staying steady. She denies any heart failure symptoms currently.  Her sugars are doing well around 187. She is trying to get better with it and taking trulicity.  Her pressures are doing well and she checks them at home on occassion. We talked about making a habit of checking numbers again to be able to take to doctor.       Expected Outcomes Short: Start recording numbers at home Long: Continue to work on diabetes management.                Core Components/Risk Factors/Patient Goals at Discharge (Final Review):   Goals and Risk Factor Review - 10/01/20 0755       Core Components/Risk Factors/Patient Goals Review   Personal Goals Review Weight Management/Obesity;Hypertension;Diabetes;Lipids;Heart Failure    Review Ricquel Lugenedoing well in rehab.  Her weight is staying steady. She denies any heart failure  symptoms currently.  Her sugars are doing well around 187. She is trying to get better with it and taking trulicity.  Her pressures are doing well and she checks them at home on occassion. We talked about making a habit of checking numbers again to be able to take to doctor.    Expected Outcomes Short: Start recording numbers at home Long: Continue to work on diabetes management.             ITP Comments:  ITP Comments     Row Name 08/01/20 1507 08/05/20 0903 08/13/20 0759 08/18/20 0857 08/20/20 1530   ITP Comments Initial telephone orientation completed. Diagnosis can be found in [facility]. EP orientation scheduled for Tuesday 5/10 at 8am. Completed 6MWT and gym orientation. Initial ITP created and sent for review to Dr. MarkEmily Filbertdical Director. 30 Day review completed. Medical Director ITP review done, changes made as directed, and signed approval by Medical Director.   New to program First full day of exercise!  Patient was oriented to gym and equipment including functions, settings, policies, and procedures.  Patient's individual  exercise prescription and treatment plan were reviewed.  All starting workloads were established based on the results of the 6 minute walk test done at initial orientation visit.  The plan for exercise progression was also introduced and progression will be customized based on patient's performance and goals.  Alexie has attended the program in the past Completed initial RD evaluation    Row Name 09/08/20 1628 09/10/20 0908 09/24/20 0845 10/08/20 1140 10/08/20 1141   ITP Comments Unable to complete Asjah's goals this review. Only attended 4 times since starting in May. 30 Day review completed. Medical Director ITP review done, changes made as directed, and signed approval by Medical Director. Pt completed 20 laps on the track and then needed to rest. Pt unable to continue exercising today due to 7/10 tightness in chest/chest pain. Pt BP checked, 122/76, and advised to  take nitro per her prescription. Pt BP recheck at 92/70 with headache, nausea, and chest pain increasing per patient (pain described as stabbing and sharp at times, pressure, and discomfort). Pt was taken to emergency room by Cyril Mourning, RN, for further evaluation. 30 Day review completed. Medical Director ITP review done, changes made as directed, and signed approval by Medical Director. Cambry's physician has released her to CR with no restrictions.   Jenafer stated that she was told it may be scar tissue causing the pain.    [place] Name 10/31/20 0826           ITP Comments Olive graduated today from  rehab with 30 sessions completed.  Details of the patient's exercise prescription and what She needs to do in order to continue the prescription and progress were discussed with patient.  Patient was given a copy of prescription and goals.  Patient verbalized understanding.  Hanley plans to continue to exercise by walking at home.                Comments: Discharge ITP

## 2020-10-31 NOTE — Chronic Care Management (AMB) (Signed)
Chronic Care Management   CCM RN Visit Note  10/31/2020 Name: Candace [name] MRN: [id number] DOB: 1950/10/02  Subjective: Candace [name] is a 70 y.o. year old female who is a primary care patient of Candace [name]., MD. The care management team was consulted for assistance with disease management and care coordination needs.    Engaged with patient by telephone for follow up visit in response to provider referral for case management and care coordination services.   Consent to Services:  The patient was given information about Chronic Care Management services, agreed to services, and gave verbal consent prior to initiation of services.  Please see initial visit note for detailed documentation.    Assessment: Review of patient past medical history, allergies, medications, health status, including review of consultants reports, laboratory and other test data, was performed as part of comprehensive evaluation and provision of chronic care management services.   SDOH (Social Determinants of Health) assessments and interventions performed:  No  CCM Care Plan  Allergies  Allergen Reactions   Lisinopril Cough   Penicillins Swelling, Rash and Other (See Comments)    Did it involve swelling of the face/tongue/throat, SOB, or low BP? Yes Did it involve sudden or severe rash/hives, skin peeling, or any reaction on the inside of your mouth or nose? Yes Did you need to seek medical attention at a hospital or doctor's office? Yes When did it last happen?      15 years If all above answers are "NO", may proceed with cephalosporin use.     Outpatient Encounter Medications as of 10/31/2020  Medication Sig Note   acetaminophen (TYLENOL) 325 MG tablet Take 650 mg by mouth every 6 (six) hours as needed for moderate pain or headache.     bismuth subsalicylate (PEPTO BISMOL) 262 MG chewable tablet Chew 524 mg by mouth as needed.    clopidogrel (PLAVIX) 75 MG tablet Take 1 tablet (75 mg total) by  mouth daily.    Dulaglutide (TRULICITY) 1.5 0000000 SOPN Inject 1.5 mg into the skin once a week.    ELIQUIS 5 MG TABS tablet Take 5 mg by mouth 2 (two) times daily. 06/06/2020: Prescribed by Dr. Nehemiah Massed   ezetimibe (ZETIA) 10 MG tablet Take 1 tablet (10 mg total) by mouth daily.    isosorbide mononitrate (IMDUR) 30 MG 24 hr tablet Take 30 mg by mouth daily.    JARDIANCE 25 MG TABS tablet TAKE ONE TABLET BY MOUTH BEFORE BREAKFAST    Lancets (ONETOUCH DELICA PLUS 123XX123) MISC USE UP TO 4 TIMES DAILY AS DIRECTED    loperamide (IMODIUM) 2 MG capsule Take 2 mg by mouth as needed.    losartan (COZAAR) 25 MG tablet Take 1 tablet (25 mg total) by mouth daily.    metoprolol tartrate (LOPRESSOR) 100 MG tablet Take 100 mg by mouth 2 (two) times daily. 06/06/2020: Prescribed by Dr. Nehemiah Massed    nitroGLYCERIN (NITROSTAT) 0.4 MG SL tablet Place 1 tablet (0.4 mg total) under the tongue every 5 (five) minutes as needed for chest pain.    nystatin (NYSTATIN) powder Apply 1 application topically daily. (Patient taking differently: Apply 1 application topically daily as needed.)    ondansetron (ZOFRAN-ODT) 8 MG disintegrating tablet Take 1 tablet (8 mg total) by mouth every 8 (eight) hours as needed.    pantoprazole (PROTONIX) 20 MG tablet Take 1 tablet (20 mg total) by mouth 2 (two) times daily.    rosuvastatin (CRESTOR) 40 MG tablet Take 1 tablet (  40 mg total) by mouth daily.    sertraline (ZOLOFT) 50 MG tablet Take 1 tablet (50 mg total) by mouth daily.    [DISCONTINUED] cetirizine (ZYRTEC) 5 MG tablet Take 2 tablets (10 mg total) by mouth daily.    No facility-administered encounter medications on file as of 10/31/2020.    Patient Active Problem List   Diagnosis Date Noted   Vertigo    Unsteady gait    Acute bronchitis 03/07/2019   Esophageal dysphagia    Stomach irritation    Gastric polyp    Esophageal lump    Hx of colonic polyp    Polyp of colon    Diverticulosis of large intestine without  diverticulitis    PSVT (paroxysmal supraventricular tachycardia) ([place]) 10/03/2018   Abnormal ECG 07/05/2018   Tachycardia 03/27/2018   Pain in limb 02/28/2018   Chronic diastolic CHF (congestive heart failure) ([place]) 11/03/2017   TIA (transient ischemic attack) 11/03/2017   COPD suggested by initial evaluation ([place]) 08/23/2017   Acute and chronic respiratory failure with hypoxia ([place]) 08/23/2017   Postoperative state 08/15/2017   Incidental pulmonary nodule 08/01/2017   Non-ST elevation myocardial infarction (NSTEMI), subendocardial infarction, subsequent episode of care ([place]) 06/01/2017   B12 deficiency 12/01/2016   Vitamin D deficiency 11/04/2016   Depression with anxiety 09/30/2016   AF (paroxysmal atrial fibrillation) ([place]) 09/30/2016   Resting tremor 09/30/2016   Mild obstructive sleep apnea 09/30/2016   Headache 08/18/2016   Unstable angina ([place]) 08/03/2016   Type 2 diabetes mellitus with complication, without long-term current use of insulin ([place]) 08/03/2016   Chronic daily headache 05/05/2016   Asthma 11/11/2015   S/P CABG x 4 11/10/2015   Coronary artery disease 11/07/2015   Carotid artery narrowing 02/08/2014   Chest pain 02/08/2014   Mixed hyperlipidemia 02/08/2014   Bilateral carotid artery stenosis 02/08/2014   Atypical chest pain 02/07/2014   Essential hypertension 02/07/2014   Awareness of heartbeats 02/07/2014   D (diarrhea) 10/05/2013   Gastroesophageal reflux disease 10/05/2013    Conditions to be addressed/monitored:CHF  Patient Care Plan: Heart Failure (Adult)     Problem Identified: Symptom Exacerbation (Heart Failure)   Priority: High  Onset Date: 04/15/2020     Long-Range Goal: Symptom Exacerbation Prevented or Minimized   Start Date: 09/09/2020  Expected End Date: 01/07/2021  Recent Progress: On track  Priority: High  Note:    Current Barriers:  Chronic Disease Management support and educational needs r/t CHF.  Case Manager Clinical  Goal(s):  Over the next 120 days, patient will not require hospitalization or emergent evaluation d/t complications r/t CHF exacerbation.  Interventions:  Collaboration with Candace [name]., MD regarding development and update of comprehensive plan of care as evidenced by provider attestation and co-signature Inter-disciplinary care team collaboration (see longitudinal plan of care) Reviewed medications and current plan for CHF management. Reports excellent compliance with treatment plan. Reports completing last session of Cardiac Rehab today. Reports weighing as advised. Weights have been within the established range. Denies edema or s/sx of fluid overload.  Reviewed worsening symptoms r/t CHF exacerbation and indications for seeking immediate medical attention.     Patient Goals/Self-Care Activities:  Take medications as prescribed Follow plan for CHF symptom management and notify provider with concerns Monitor and record weights Adhere to recommended cardiac diet Notify provider or care management team with questions or new concerns as needed    Follow Up Plan:  Will follow up next month      PLAN:  A member of the care management team will follow up within the next month.   [name] Care Management [facility] [phone]

## 2020-10-31 NOTE — Patient Instructions (Signed)
Discharge Patient Instructions  Patient Details  Name: Candace [name] MRN: [id number] Date of Birth: 1950-07-11 Referring Provider:  Corey Skains, MD   Number of Visits: 61   Reason for Discharge:  Patient reached a stable level of exercise. Patient independent in their exercise. Patient has met program and personal goals.  Smoking History:  Social History   Tobacco Use  Smoking Status Former   Packs/day: 0.50   Types: Cigarettes   Quit date: 10/07/2001   Years since quitting: 19.0  Smokeless Tobacco Never    Diagnosis:  No diagnosis found.  Initial Exercise Prescription:  Initial Exercise Prescription - 08/05/20 0900       Date of Initial Exercise RX and Referring Provider   Date 08/05/20    Referring Provider [name]      NuStep   Level 2    SPM 80    Minutes 15    METs 2.5      Arm Ergometer   Level 1    Watts 25    RPM 25    Minutes 15    METs 2      Track   Laps 25    Minutes 15    METs 2.4      Prescription Details   Frequency (times per week) 3    Duration Progress to 30 minutes of continuous aerobic without signs/symptoms of physical distress      Intensity   THRR 40-80% of Max Heartrate 111-138    Ratings of Perceived Exertion 11-13    Perceived Dyspnea 0-4      Progression   Progression Continue to progress workloads to maintain intensity without signs/symptoms of physical distress.      Resistance Training   Training Prescription Yes    Weight 3 lb    Reps 10-15             Discharge Exercise Prescription (Final Exercise Prescription Changes):  Exercise Prescription Changes - 10/28/20 0700       Response to Exercise   Blood Pressure (Admit) 100/62    Blood Pressure (Exercise) 166/66    Blood Pressure (Exit) 100/62    Heart Rate (Admit) 101 bpm    Heart Rate (Exercise) 138 bpm    Heart Rate (Exit) 104 bpm    Rating of Perceived Exertion (Exercise) 12    Symptoms none    Duration Continue with 30 min  of aerobic exercise without signs/symptoms of physical distress.    Intensity THRR unchanged      Progression   Progression Continue to progress workloads to maintain intensity without signs/symptoms of physical distress.    Average METs 3.02      Resistance Training   Training Prescription Yes    Weight 5 lb    Reps 10-15      Interval Training   Interval Training No      NuStep   Level 5    Minutes 15    METs 3.4      Arm Ergometer   Level 1.3    Minutes 15      Track   Laps 30    Minutes 15    METs 2.63      Home Exercise Plan   Plans to continue exercise at Home (comment)   walking, dancing   Frequency Add 2 additional days to program exercise sessions.    Initial Home Exercises Provided 10/01/20  Functional Capacity:  6 Minute Walk     Row Name 08/05/20 0903 10/24/20 0905       6 Minute Walk   Phase Initial Discharge    Distance 930 feet 1245 feet    Distance % Change -- 33.8 %    Distance Feet Change -- 315 ft    Walk Time 5.53 minutes 6 minutes    # of Rest Breaks 1  28 sec 0    MPH 1.91 2.35    METS 2.58 3.3    RPE 14 12    Perceived Dyspnea  3 3    VO2 Peak 9.05 11.56    Symptoms Yes (comment) No    Comments chest tightness 7-10, legs aching (thighs and calves) 8/10, SOB, wobbly gait --    Resting HR 85 bpm 100 bpm    Resting BP 122/64 100/62    Resting Oxygen Saturation  96 % --    Exercise Oxygen Saturation  during 6 min walk 98 % --    Max Ex. HR 128 bpm 135 bpm    Max Ex. BP 156/74 166/66    2 Minute Post BP 126/70 --            Nutrition:  Nutrition Therapy & Goals - 08/20/20 1531       Nutrition Therapy   Diet Heart healthy, low Na, diabetes friendly eating.    Drug/Food Interactions Statins/Certain Fruits    Protein (specify units) 60g    Fiber 25 grams    Whole Grain Foods 3 servings    Saturated Fats 12 max. grams    Sodium 1.5 grams      Personal Nutrition Goals   Nutrition Goal ST: choose more non-  starchy vegetables at meals, rinse off canned food with water and cloose low sodium or no salt added LT: balance meals using MyPlate method. limit salt intake to <1.5g/day    Comments B: yogurt with banana with water and medications S: walnuts or peanut butter or cheese crackers or glucerna L: salad and bottle of water and handful of walnuts or glucerna D:baked chicken, corn, cabbage, and dinner roll (did not finish). Meat (fish or shrimp, chicken, ground meat 1x/week, pork chop - typically meat is baked or missed in with other foods) and vegetable and CHO (mashed potatoes, corn, dinner rolls).  She mentioned she only has hot dogs 1x/month. She does not like collards or mixed green or eggplant or brussells sprouts. She eats lots of fruit. Discussed heart healhty eating and diabetes friendly eating.      Intervention Plan   Intervention Prescribe, educate and counsel regarding individualized specific dietary modifications aiming towards targeted core components such as weight, hypertension, lipid management, diabetes, heart failure and other comorbidities.    Expected Outcomes Short Term Goal: Understand basic principles of dietary content, such as calories, fat, sodium, cholesterol and nutrients.;Short Term Goal: A plan has been developed with personal nutrition goals set during dietitian appointment.;Long Term Goal: Adherence to prescribed nutrition plan.            Goals reviewed with patient; copy given to patient.

## 2020-10-31 NOTE — Progress Notes (Signed)
Discharge Progress Report  Patient Details  Name: Candace [name] MRN: [id number] Date of Birth: [birth year] Referring Provider:   Flowsheet Row Cardiac Rehab from 08/05/2020 in [facility]  Referring Provider [name]        Number of Visits: 30  Reason for Discharge:  Patient reached a stable level of exercise. Patient independent in their exercise.  Smoking History:  Social History   Tobacco Use  Smoking Status Former   Packs/day: 0.50   Types: Cigarettes   Quit date: 10/07/2001   Years since quitting: 19.0  Smokeless Tobacco Never    Diagnosis:  NSTEMI (non-ST elevated myocardial infarction) [facility])    Initial Exercise Prescription:  Initial Exercise Prescription - 08/05/20 0900       Date of Initial Exercise RX and Referring Provider   Date 08/05/20    Referring Provider [name]      NuStep   Level 2    SPM 80    Minutes 15    METs 2.5      Arm Ergometer   Level 1    Watts 25    RPM 25    Minutes 15    METs 2      Track   Laps 25    Minutes 15    METs 2.4      Prescription Details   Frequency (times per week) 3    Duration Progress to 30 minutes of continuous aerobic without signs/symptoms of physical distress      Intensity   THRR 40-80% of Max Heartrate 111-138    Ratings of Perceived Exertion 11-13    Perceived Dyspnea 0-4      Progression   Progression Continue to progress workloads to maintain intensity without signs/symptoms of physical distress.      Resistance Training   Training Prescription Yes    Weight 3 lb    Reps 10-15             Discharge Exercise Prescription (Final Exercise Prescription Changes):  Exercise Prescription Changes - 10/28/20 0700       Response to Exercise   Blood Pressure (Admit) 100/62    Blood Pressure (Exercise) 166/66    Blood Pressure (Exit) 100/62    Heart Rate (Admit) 101 bpm    Heart Rate (Exercise) 138 bpm    Heart Rate (Exit) 104 bpm     Rating of Perceived Exertion (Exercise) 12    Symptoms none    Duration Continue with 30 min of aerobic exercise without signs/symptoms of physical distress.    Intensity THRR unchanged      Progression   Progression Continue to progress workloads to maintain intensity without signs/symptoms of physical distress.    Average METs 3.02      Resistance Training   Training Prescription Yes    Weight 5 lb    Reps 10-15      Interval Training   Interval Training No      NuStep   Level 5    Minutes 15    METs 3.4      Arm Ergometer   Level 1.3    Minutes 15      Track   Laps 30    Minutes 15    METs 2.63      Home Exercise Plan   Plans to continue exercise at Home (comment)   walking, dancing   Frequency Add 2 additional days to program exercise sessions.  Initial Home Exercises Provided 10/01/20             Functional Capacity:  6 Minute Walk     Row Name 08/05/20 0903 10/24/20 0905       6 Minute Walk   Phase Initial Discharge    Distance 930 feet 1245 feet    Distance % Change -- 33.8 %    Distance Feet Change -- 315 ft    Walk Time 5.53 minutes 6 minutes    # of Rest Breaks 1  28 sec 0    MPH 1.91 2.35    METS 2.58 3.3    RPE 14 12    Perceived Dyspnea  3 3    VO2 Peak 9.05 11.56    Symptoms Yes (comment) No    Comments chest tightness 7-10, legs aching (thighs and calves) 8/10, SOB, wobbly gait --    Resting HR 85 bpm 100 bpm    Resting BP 122/64 100/62    Resting Oxygen Saturation  96 % --    Exercise Oxygen Saturation  during 6 min walk 98 % --    Max Ex. HR 128 bpm 135 bpm    Max Ex. BP 156/74 166/66    2 Minute Post BP 126/70 --            Nutrition & Weight - Outcomes:  Pre Biometrics - 08/05/20 0908       Pre Biometrics   Height 5' 2.1" (1.577 m)    Weight 166 lb (75.3 kg)    BMI (Calculated) 30.28    Single Leg Stand 0.8 seconds            Discharge weight 169.1 lbs.  Nutrition:  Nutrition Therapy & Goals - 08/20/20  1531       Nutrition Therapy   Diet Heart healthy, low Na, diabetes friendly eating.    Drug/Food Interactions Statins/Certain Fruits    Protein (specify units) 60g    Fiber 25 grams    Whole Grain Foods 3 servings    Saturated Fats 12 max. grams    Sodium 1.5 grams      Personal Nutrition Goals   Nutrition Goal ST: choose more non- starchy vegetables at meals, rinse off canned food with water and cloose low sodium or no salt added LT: balance meals using MyPlate method. limit salt intake to <1.5g/day    Comments B: yogurt with banana with water and medications S: walnuts or peanut butter or cheese crackers or glucerna L: salad and bottle of water and handful of walnuts or glucerna D:baked chicken, corn, cabbage, and dinner roll (did not finish). Meat (fish or shrimp, chicken, ground meat 1x/week, pork chop - typically meat is baked or missed in with other foods) and vegetable and CHO (mashed potatoes, corn, dinner rolls).  She mentioned she only has hot dogs 1x/month. She does not like collards or mixed green or eggplant or brussells sprouts. She eats lots of fruit. Discussed heart healhty eating and diabetes friendly eating.      Intervention Plan   Intervention Prescribe, educate and counsel regarding individualized specific dietary modifications aiming towards targeted core components such as weight, hypertension, lipid management, diabetes, heart failure and other comorbidities.    Expected Outcomes Short Term Goal: Understand basic principles of dietary content, such as calories, fat, sodium, cholesterol and nutrients.;Short Term Goal: A plan has been developed with personal nutrition goals set during dietitian appointment.;Long Term Goal: Adherence to prescribed nutrition plan.  Goals reviewed with patient; copy given to patient.

## 2020-11-05 ENCOUNTER — Other Ambulatory Visit: Payer: Self-pay | Admitting: Family Medicine

## 2020-11-05 ENCOUNTER — Encounter: Payer: Self-pay | Admitting: *Deleted

## 2020-11-05 DIAGNOSIS — E118 Type 2 diabetes mellitus with unspecified complications: Secondary | ICD-10-CM

## 2020-11-05 DIAGNOSIS — I214 Non-ST elevation (NSTEMI) myocardial infarction: Secondary | ICD-10-CM

## 2020-11-05 NOTE — Telephone Encounter (Signed)
Notes to clinic:  Patient has appt on 11/18/2020 Last filled on 10/16/2020 for 30 day  Patient has abnormal labs Review for refill    Requested Prescriptions  Pending Prescriptions Disp Refills   JARDIANCE 25 MG TABS tablet [Pharmacy Med Name: Jardiance 25 mg tablet] 30 tablet 2    Sig: TAKE ONE TABLET BY MOUTH BEFORE BREAKFAST      Endocrinology:  Diabetes - SGLT2 Inhibitors Failed - 11/05/2020  8:01 AM      Failed - Cr in normal range and within 360 days    Creat  Date Value Ref Range Status  03/24/2017 1.39 (H) 0.50 - 0.99 mg/dL Final    Comment:    For patients >19 years of age, the reference limit for Creatinine is approximately 13% higher for people identified as African-American. .    Creatinine, Ser  Date Value Ref Range Status  10/20/2020 1.24 (H) 0.57 - 1.00 mg/dL Final          Failed - HBA1C is between 0 and 7.9 and within 180 days    Hemoglobin A1C  Date Value Ref Range Status  07/10/2019 9.2  Final   Hgb A1c MFr Bld  Date Value Ref Range Status  08/11/2020 9.1 (H) 4.8 - 5.6 % Final    Comment:             Prediabetes: 5.7 - 6.4          Diabetes: >6.4          Glycemic control for adults with diabetes: <7.0           Passed - LDL in normal range and within 360 days    Ldl Cholesterol, Calc  Date Value Ref Range Status  07/14/2013 154 (H) 0 - 100 mg/dL Final   LDL Chol Calc (NIH)  Date Value Ref Range Status  08/11/2020 90 0 - 99 mg/dL Final          Passed - AA eGFR in normal range and within 360 days    EGFR (African American)  Date Value Ref Range Status  04/29/2014 >60 >65m/min Final  09/16/2013 >60  Final   GFR calc Af Amer  Date Value Ref Range Status  02/06/2020 108 >59 mL/min/1.73 Final    Comment:    **In accordance with recommendations from the NKF-ASN Task force,**   [facility] is in the process of updating its eGFR calculation to the   2021 CKD-EPI creatinine equation that estimates kidney function   without a race  variable.    EGFR (Non-African Amer.)  Date Value Ref Range Status  04/29/2014 >60 >637mmin Final    Comment:    eGFR values <6071min/1.73 m2 may be an indication of chronic kidney disease (CKD). Calculated eGFR, using the MRDR Study equation, is useful in  patients with stable renal function. The eGFR calculation will not be reliable in acutely ill patients when serum creatinine is changing rapidly. It is not useful in patients on dialysis. The eGFR calculation may not be applicable to patients at the low and high extremes of body sizes, pregnant women, and vegetarians.   09/16/2013 >60  Final    Comment:    eGFR values <46m51mn/1.73 m2 may be an indication of chronic kidney disease (CKD). Calculated eGFR is useful in patients with stable renal function. The eGFR calculation will not be reliable in acutely ill patients when serum creatinine is changing rapidly. It is not useful in  patients on dialysis. The eGFR calculation may  not be applicable to patients at the low and high extremes of body sizes, pregnant women, and vegetarians.    GFR, Estimated  Date Value Ref Range Status  10/04/2020 >60 >60 mL/min Final    Comment:    (NOTE) Calculated using the CKD-EPI Creatinine Equation (2021)    eGFR  Date Value Ref Range Status  10/20/2020 47 (L) >59 mL/min/1.73 Final          Passed - Valid encounter within last 6 months    Recent Outpatient Visits           2 months ago Uncontrolled type 2 diabetes mellitus with hyperglycemia [facility])   [facility] [name]., MD   5 months ago Viral URI   [facility] [name]., MD   6 months ago Esophageal dysphagia   [facility] [name]., MD   9 months ago Type 2 diabetes mellitus with complication, without long-term current use of insulin [facility])   [facility] [name]., MD   11 months ago Type 2 diabetes mellitus with  complication, without long-term current use of insulin [facility])   [facility] [name]., MD       Future Appointments             In 2 weeks [name]., MD [facility]

## 2020-11-05 NOTE — Progress Notes (Signed)
Cardiac Individual Treatment Plan  Patient Details  Name: Candace [name] MRN: [id number] Date of Birth: [birth year] Referring Provider:   Flowsheet Row Cardiac Rehab from 08/05/2020 in [facility] Cardiac and Pulmonary Rehab  Referring Provider [name]       Initial Encounter Date:  Flowsheet Row Cardiac Rehab from 08/05/2020 in [facility] Cardiac and Pulmonary Rehab  Date 08/05/20       Visit Diagnosis: NSTEMI (non-ST elevated myocardial infarction) [facility])  Patient's Home Medications on Admission:  Current Outpatient Medications:    acetaminophen (TYLENOL) 325 MG tablet, Take 650 mg by mouth every 6 (six) hours as needed for moderate pain or headache. , Disp: , Rfl:    bismuth subsalicylate (PEPTO BISMOL) 262 MG chewable tablet, Chew 524 mg by mouth as needed., Disp: , Rfl:    clopidogrel (PLAVIX) 75 MG tablet, Take 1 tablet (75 mg total) by mouth daily., Disp: 90 tablet, Rfl: 0   Dulaglutide (TRULICITY) 1.5 CL/2.7NT SOPN, Inject 1.5 mg into the skin once a week., Disp: 2 mL, Rfl: 2   ELIQUIS 5 MG TABS tablet, Take 5 mg by mouth 2 (two) times daily., Disp: , Rfl:    ezetimibe (ZETIA) 10 MG tablet, Take 1 tablet (10 mg total) by mouth daily., Disp: 90 tablet, Rfl: 0   isosorbide mononitrate (IMDUR) 30 MG 24 hr tablet, Take 30 mg by mouth daily., Disp: , Rfl:    JARDIANCE 25 MG TABS tablet, TAKE ONE TABLET BY MOUTH BEFORE BREAKFAST, Disp: 30 tablet, Rfl: 2   Lancets (ONETOUCH DELICA PLUS [id number]) MISC, USE UP TO 4 TIMES DAILY AS DIRECTED, Disp: 100 each, Rfl: 0   loperamide (IMODIUM) 2 MG capsule, Take 2 mg by mouth as needed., Disp: , Rfl:    losartan (COZAAR) 25 MG tablet, Take 1 tablet (25 mg total) by mouth daily., Disp: 90 tablet, Rfl: 0   metoprolol tartrate (LOPRESSOR) 100 MG tablet, Take 100 mg by mouth 2 (two) times daily., Disp: , Rfl:    nitroGLYCERIN (NITROSTAT) 0.4 MG SL tablet, Place 1 tablet (0.4 mg total) under the tongue every 5 (five) minutes as needed for chest  pain., Disp: 30 tablet, Rfl: 0   nystatin (NYSTATIN) powder, Apply 1 application topically daily. (Patient taking differently: Apply 1 application topically daily as needed.), Disp: 60 g, Rfl: 5   ondansetron (ZOFRAN-ODT) 8 MG disintegrating tablet, Take 1 tablet (8 mg total) by mouth every 8 (eight) hours as needed., Disp: 20 tablet, Rfl: 1   pantoprazole (PROTONIX) 20 MG tablet, Take 1 tablet (20 mg total) by mouth 2 (two) times daily., Disp: 60 tablet, Rfl: 11   rosuvastatin (CRESTOR) 40 MG tablet, Take 1 tablet (40 mg total) by mouth daily., Disp: 90 tablet, Rfl: 0   sertraline (ZOLOFT) 50 MG tablet, Take 1 tablet (50 mg total) by mouth daily., Disp: 90 tablet, Rfl: 1  Past Medical History: Past Medical History:  Diagnosis Date   Allergy    Anemia    Anxiety    Arrhythmia    Arthritis    Atrial fibrillation ([place])    Coronary artery disease    Depression    Diabetes mellitus without complication ([place])    Dyspnea    doe   Dysrhythmia    GERD (gastroesophageal reflux disease)    Headache    History of hiatal hernia    Hyperlipidemia    Hypertension    Myocardial infarction [facility])    2016, 04/2017   Myocardial infarction with cardiac rehabilitation (  [place])    MI 2016/ CABG 8/17    FINISHED CARDIAC REHAB 3 WEEKS AGO   Panic attack    Pneumonia    Reflux    Stroke ([place]) 2015   showed up on MRI; no weakness noted   TIA (transient ischemic attack)    Voice tremor     Tobacco Use: Social History   Tobacco Use  Smoking Status Former   Packs/day: 0.50   Types: Cigarettes   Quit date: 10/07/2001   Years since quitting: 19.0  Smokeless Tobacco Never    Labs: Recent Review Flowsheet Data     Labs for ITP Cardiac and Pulmonary Rehab Latest Ref Rng & Units 08/31/2019 09/04/2019 12/06/2019 05/01/2020 08/11/2020   Cholestrol 100 - 199 mg/dL 234(H) - - - 164   LDLCALC 0 - 99 mg/dL 163(H) - - - 90   HDL >39 mg/dL 49 - - - 57   Trlycerides 0 - 149 mg/dL 110 - - - 94   Hemoglobin A1c 4.8  - 5.6 % - 9.4(A) 12.4(A) 12.2(A) 9.1(H)   PHART 7.350 - 7.450 - - - - -   PCO2ART 35.0 - 45.0 mmHg - - - - -   HCO3 20.0 - 24.0 mEq/L - - - - -   TCO2 0 - 100 mmol/L - - - - -   O2SAT % - - - - -        Exercise Target Goals: Exercise Program Goal: Individual exercise prescription set using results from initial 6 min walk test and THRR while considering  patient's activity barriers and safety.   Exercise Prescription Goal: Initial exercise prescription builds to 30-45 minutes a day of aerobic activity, 2-3 days per week.  Home exercise guidelines will be given to patient during program as part of exercise prescription that the participant will acknowledge.   Education: Aerobic Exercise: - Group verbal and visual presentation on the components of exercise prescription. Introduces F.I.T.T principle from ACSM for exercise prescriptions.  Reviews F.I.T.T. principles of aerobic exercise including progression. Written material given at graduation. Flowsheet Row Cardiac Rehab from 10/22/2020 in [facility] Cardiac and Pulmonary Rehab  Education need identified 08/05/20  Date 09/17/20  Educator [facility]  Instruction Review Code 1- Verbalizes Understanding       Education: Resistance Exercise: - Group verbal and visual presentation on the components of exercise prescription. Introduces F.I.T.T principle from ACSM for exercise prescriptions  Reviews F.I.T.T. principles of resistance exercise including progression. Written material given at graduation.    Education: Exercise & Equipment Safety: - Individual verbal instruction and demonstration of equipment use and safety with use of the equipment. Flowsheet Row Cardiac Rehab from 10/22/2020 in [facility] Cardiac and Pulmonary Rehab  Date 08/05/20  Educator [facility]  Instruction Review Code 1- Verbalizes Understanding       Education: Exercise Physiology & General Exercise Guidelines: - Group verbal and written instruction with models to review the exercise  physiology of the cardiovascular system and associated critical values. Provides general exercise guidelines with specific guidelines to those with heart or lung disease.  Flowsheet Row Cardiac Rehab from 10/22/2020 in [place] Cardiac and Pulmonary Rehab  Date 09/10/20  Educator [facility]  Instruction Review Code 1- Verbalizes Understanding       Education: Flexibility, Balance, Mind/Body Relaxation: - Group verbal and visual presentation with interactive activity on the components of exercise prescription. Introduces F.I.T.T principle from ACSM for exercise prescriptions. Reviews F.I.T.T. principles of flexibility and balance exercise training including progression. Also discusses the  mind body connection.  Reviews various relaxation techniques to help reduce and manage stress (i.e. Deep breathing, progressive muscle relaxation, and visualization). Balance handout provided to take home. Written material given at graduation. Flowsheet Row Cardiac Rehab from 10/22/2020 in [facility] Cardiac and Pulmonary Rehab  Date 10/01/20  Educator [facility]  Instruction Review Code 1- Verbalizes Understanding       Activity Barriers & Risk Stratification:  Activity Barriers & Cardiac Risk Stratification - 08/05/20 0905       Activity Barriers & Cardiac Risk Stratification   Activity Barriers Back Problems;Deconditioning;Shortness of Breath;Muscular Weakness;Balance Concerns;History of Falls    Cardiac Risk Stratification High             6 Minute Walk:  6 Minute Walk     Row Name 08/05/20 0903 10/24/20 0905       6 Minute Walk   Phase Initial Discharge    Distance 930 feet 1245 feet    Distance % Change -- 33.8 %    Distance Feet Change -- 315 ft    Walk Time 5.53 minutes 6 minutes    # of Rest Breaks 1  28 sec 0    MPH 1.91 2.35    METS 2.58 3.3    RPE 14 12    Perceived Dyspnea  3 3    VO2 Peak 9.05 11.56    Symptoms Yes (comment) No    Comments chest tightness 7-10, legs aching (thighs and calves)  8/10, SOB, wobbly gait --    Resting HR 85 bpm 100 bpm    Resting BP 122/64 100/62    Resting Oxygen Saturation  96 % --    Exercise Oxygen Saturation  during 6 min walk 98 % --    Max Ex. HR 128 bpm 135 bpm    Max Ex. BP 156/74 166/66    2 Minute Post BP 126/70 --             Oxygen Initial Assessment:   Oxygen Re-Evaluation:   Oxygen Discharge (Final Oxygen Re-Evaluation):   Initial Exercise Prescription:  Initial Exercise Prescription - 08/05/20 0900       Date of Initial Exercise RX and Referring Provider   Date 08/05/20    Referring Provider [name]      NuStep   Level 2    SPM 80    Minutes 15    METs 2.5      Arm Ergometer   Level 1    Watts 25    RPM 25    Minutes 15    METs 2      Track   Laps 25    Minutes 15    METs 2.4      Prescription Details   Frequency (times per week) 3    Duration Progress to 30 minutes of continuous aerobic without signs/symptoms of physical distress      Intensity   THRR 40-80% of Max Heartrate 111-138    Ratings of Perceived Exertion 11-13    Perceived Dyspnea 0-4      Progression   Progression Continue to progress workloads to maintain intensity without signs/symptoms of physical distress.      Resistance Training   Training Prescription Yes    Weight 3 lb    Reps 10-15             Perform Capillary Blood Glucose checks as needed.  Exercise Prescription Changes:   Exercise Prescription Changes     Row  Name 08/05/20 0900 08/18/20 1200 09/02/20 1400 09/15/20 1100 09/30/20 1400     Response to Exercise   Blood Pressure (Admit) 122/64 100/58 120/68 108/68 112/66   Blood Pressure (Exercise) 156/74 142/80 162/88 124/78 122/76   Blood Pressure (Exit) 126/70 110/60 112/74 122/76 92/70   Heart Rate (Admit) 85 bpm 113 bpm 77 bpm 91 bpm 96 bpm   Heart Rate (Exercise) 128 bpm 162 bpm 126 bpm 139 bpm 140 bpm   Heart Rate (Exit) 76 bpm 109 bpm 77 bpm 104 bpm 78 bpm   Oxygen Saturation (Admit)  96 % -- -- -- --   Oxygen Saturation (Exercise) 98 % -- -- -- --   Rating of Perceived Exertion (Exercise) _0 Perceived Dyspnea (Exercise) 3 -- -- -- --   Symptoms wobbly gait, SOB, legs aching (8/10), chest tightness (7/10) high HR -- -- chest pain walking   Comments walk test results first day -- -- --   Duration -- Progress to 30 minutes of  aerobic without signs/symptoms of physical distress Continue with 30 min of aerobic exercise without signs/symptoms of physical distress. Continue with 30 min of aerobic exercise without signs/symptoms of physical distress. Continue with 30 min of aerobic exercise without signs/symptoms of physical distress.   Intensity -- THRR unchanged THRR unchanged THRR unchanged THRR unchanged     Progression   Progression -- Continue to progress workloads to maintain intensity without signs/symptoms of physical distress. Continue to progress workloads to maintain intensity without signs/symptoms of physical distress. Continue to progress workloads to maintain intensity without signs/symptoms of physical distress. Continue to progress workloads to maintain intensity without signs/symptoms of physical distress.   Average METs -- 2.6 2.85 2.6 2.74     Resistance Training   Training Prescription -- Yes Yes Yes Yes   Weight -- 3 lb 3 lb 3 lb 5 lb   Reps -- 10-15 10-15 10-15 10-15     Interval Training   Interval Training -- -- No No No     NuStep   Level -- -- 2 -- 2   Minutes -- -- 15 -- 15   METs -- -- 3.5 -- 3.6     Arm Ergometer   Level -- 1 -- 1.3 1.5   Minutes -- 15 -- 15 15   METs -- 2.71 -- 2.73 2     Track   Laps -- _1 Minutes -- _2 METs -- 2.4 2.2 2.45 2.63    Row Name 10/01/20 0700 10/15/20 1200 10/28/20 0700         Response to Exercise   Blood Pressure (Admit) -- -- 100/62     Blood Pressure (Exercise) -- -- 166/66     Blood Pressure (Exit) -- 110/70 100/62     Heart Rate (Admit) -- 87 bpm 101 bpm      Heart Rate (Exercise) -- 119 bpm 138 bpm     Heart Rate (Exit) -- 94 bpm 104 bpm     Rating of Perceived Exertion (Exercise) -- 15 12     Symptoms -- -- none     Duration -- Continue with 30 min of aerobic exercise without signs/symptoms of physical distress. Continue with 30 min of aerobic exercise without signs/symptoms of physical distress.     Intensity -- THRR unchanged THRR unchanged           Progression  Progression -- Continue to progress workloads to maintain intensity without signs/symptoms of physical distress. Continue to progress workloads to maintain intensity without signs/symptoms of physical distress.     Average METs -- 2.74 3.02           Resistance Training       Training Prescription -- Yes Yes     Weight -- 5 lb 5 lb     Reps -- 10-15 10-15           Interval Training       Interval Training -- No No           NuStep       Level -- -- 5     Minutes -- -- 15     METs -- -- 3.4           Arm Ergometer       Level -- 1.3 1.3     Minutes -- 15 15     METs -- 2.74 --           Track       Laps -- 30 30     Minutes -- 15 15     METs -- 2.63 2.63           Home Exercise Plan       Plans to continue exercise at Home (comment)  walking, dancing Home (comment)  walking, dancing Home (comment)  walking, dancing     Frequency Add 2 additional days to program exercise sessions. Add 2 additional days to program exercise sessions. Add 2 additional days to program exercise sessions.     Initial Home Exercises Provided 10/01/20 10/01/20 10/01/20             Exercise Comments:   Exercise Comments     Row Name 08/18/20 0857 09/24/20 0845 10/08/20 1142 10/31/20 0826     Exercise Comments First full day of exercise!  Patient was oriented to gym and equipment including functions, settings, policies, and procedures.  Patient's individual exercise prescription and treatment plan were reviewed.  All starting workloads were established based on the  results of the 6 minute walk test done at initial orientation visit.  The plan for exercise progression was also introduced and progression will be customized based on patient's performance and goals.  Nancy has attended the program in the past Pt completed 20 laps on the track and then needed to rest. Pt unable to continue exercising today due to 7/10 tightness in chest/chest pain. Pt BP checked, 122/76, and advised to take nitro per her prescription. Pt BP recheck at 92/70 with headache, nausea, and chest pain increasing per patient (pain described as stabbing and sharp at times, pressure, and discomfort). Pt was taken to emergency room by Cyril Mourning, RN, for further evaluation. Britzy's physician has released her to CR with no restrictions.   Phylisha stated that she was told it may be scar tissue causing the pain. Aniston graduated today from  rehab with 30 sessions completed.  Details of the patient's exercise prescription and what She needs to do in order to continue the prescription and progress were discussed with patient.  Patient was given a copy of prescription and goals.  Patient verbalized understanding.  Lakeshia plans to continue to exercise by walking at home.             Exercise Goals and Review:   Exercise Goals     Row Name 08/05/20 [phone]  Exercise Goals   Increase Physical Activity Yes       Intervention Provide advice, education, support and counseling about physical activity/exercise needs.;Develop an individualized exercise prescription for aerobic and resistive training based on initial evaluation findings, risk stratification, comorbidities and participant's personal goals.       Expected Outcomes Short Term: Attend rehab on a regular basis to increase amount of physical activity.;Long Term: Add in home exercise to make exercise part of routine and to increase amount of physical activity.;Long Term: Exercising regularly at least 3-5 days a week.       Increase Strength and Stamina  Yes       Intervention Provide advice, education, support and counseling about physical activity/exercise needs.;Develop an individualized exercise prescription for aerobic and resistive training based on initial evaluation findings, risk stratification, comorbidities and participant's personal goals.       Expected Outcomes Short Term: Increase workloads from initial exercise prescription for resistance, speed, and METs.;Short Term: Perform resistance training exercises routinely during rehab and add in resistance training at home;Long Term: Improve cardiorespiratory fitness, muscular endurance and strength as measured by increased METs and functional capacity (6MWT)       Able to understand and use rate of perceived exertion (RPE) scale Yes       Intervention Provide education and explanation on how to use RPE scale       Expected Outcomes Short Term: Able to use RPE daily in rehab to express subjective intensity level;Long Term:  Able to use RPE to guide intensity level when exercising independently       Able to understand and use Dyspnea scale Yes       Intervention Provide education and explanation on how to use Dyspnea scale       Expected Outcomes Short Term: Able to use Dyspnea scale daily in rehab to express subjective sense of shortness of breath during exertion;Long Term: Able to use Dyspnea scale to guide intensity level when exercising independently       Knowledge and understanding of Target Heart Rate Range (THRR) Yes       Intervention Provide education and explanation of THRR including how the numbers were predicted and where they are located for reference       Expected Outcomes Short Term: Able to use daily as guideline for intensity in rehab;Short Term: Able to state/look up THRR;Long Term: Able to use THRR to govern intensity when exercising independently       Able to check pulse independently Yes       Intervention Provide education and demonstration on how to check pulse in  carotid and radial arteries.;Review the importance of being able to check your own pulse for safety during independent exercise       Expected Outcomes Short Term: Able to explain why pulse checking is important during independent exercise;Long Term: Able to check pulse independently and accurately       Understanding of Exercise Prescription Yes       Intervention Provide education, explanation, and written materials on patient's individual exercise prescription       Expected Outcomes Short Term: Able to explain program exercise prescription;Long Term: Able to explain home exercise prescription to exercise independently                Exercise Goals Re-Evaluation :  Exercise Goals Re-Evaluation     Row Name 08/18/20 0857 09/02/20 1427 09/15/20 1158 09/30/20 1425 10/01/20 0737     Exercise Goal Re-Evaluation  Exercise Goals Review Knowledge and understanding of Target Heart Rate Range (THRR);Able to understand and use rate of perceived exertion (RPE) scale;Able to understand and use Dyspnea scale;Able to check pulse independently;Understanding of Exercise Prescription Increase Physical Activity;Increase Strength and Stamina;Understanding of Exercise Prescription Increase Physical Activity;Increase Strength and Stamina Increase Physical Activity;Increase Strength and Stamina;Understanding of Exercise Prescription Increase Physical Activity;Increase Strength and Stamina;Understanding of Exercise Prescription;Able to understand and use rate of perceived exertion (RPE) scale;Able to understand and use Dyspnea scale;Knowledge and understanding of Target Heart Rate Range (THRR);Able to check pulse independently   Comments Reviewed RPE and dyspnea scales, THR and program prescription with pt today.  Pt voiced understanding and was given a copy of goals to take home. Genette has missed her last two appointments.  She is up to 22 laps on the track.  We will encourage improved attendance and continue to  monitor her progress. Elliyah did 28 laps on the track today!  Staff will review home exercise soon so she can workout safelyy at home on days not at [facility]. Wylene had some chest pain last week that did not relieve with nitorglcerin and was taken to ED where it did resolve. She is up to 30 laps on the track.  We will continue to monitor her progress. Meelah is doing well in rehab.  She is pleased is with her progress.  She is doing better than she better expected. She is already walking at home and dancing around the house.  She is also using water bottle weights.  Updated home exercise plan from previous rehab. She has been stretching and it is helping her neck tightness.   Expected Outcomes Short: Use RPE daily to regulate intensity. Long: Follow program prescription in THR. Short: Improved attendance again Long: Continue to improve stamina Short: review home exercise Long: attend consistently Short: Increase NuStep Long: Continue to improve stamina Short: Continue to walk more at home, focus on HR Long: Continue to improve stamina    Row Name 10/15/20 1222 10/28/20 0712           Exercise Goal Re-Evaluation   Exercise Goals Review Increase Physical Activity;Increase Strength and Stamina Increase Physical Activity;Increase Strength and Stamina;Understanding of Exercise Prescription      Comments Taiya continues to do well. She walks up to 30 laps most sessions.  We will continue to monitor progress. Graceanne is nearing graduation.  She improved her post 6MWT by 315 ft!!  She plans to conitnue to exercise by walking and dancing at home.  We will continue to monitor her progress.      Expected Outcomes Short:  maintain consistent exercise Long: increase MET level Short: Continue to dance towards graduation Long:Continue to exercise independently               Discharge Exercise Prescription (Final Exercise Prescription Changes):  Exercise Prescription Changes - 10/28/20 0700       Response to Exercise   Blood  Pressure (Admit) 100/62    Blood Pressure (Exercise) 166/66    Blood Pressure (Exit) 100/62    Heart Rate (Admit) 101 bpm    Heart Rate (Exercise) 138 bpm    Heart Rate (Exit) 104 bpm    Rating of Perceived Exertion (Exercise) 12    Symptoms none    Duration Continue with 30 min of aerobic exercise without signs/symptoms of physical distress.    Intensity THRR unchanged      Progression   Progression Continue to progress workloads to maintain intensity  without signs/symptoms of physical distress.    Average METs 3.02      Resistance Training   Training Prescription Yes    Weight 5 lb    Reps 10-15      Interval Training   Interval Training No      NuStep   Level 5    Minutes 15    METs 3.4      Arm Ergometer   Level 1.3    Minutes 15      Track   Laps 30    Minutes 15    METs 2.63      Home Exercise Plan   Plans to continue exercise at Home (comment)   walking, dancing   Frequency Add 2 additional days to program exercise sessions.    Initial Home Exercises Provided 10/01/20             Nutrition:  Target Goals: Understanding of nutrition guidelines, daily intake of sodium <1550m, cholesterol <2042m calories 30% from fat and 7% or less from saturated fats, daily to have 5 or more servings of fruits and vegetables.  Education: All About Nutrition: -Group instruction provided by verbal, written material, interactive activities, discussions, models, and posters to present general guidelines for heart healthy nutrition including fat, fiber, MyPlate, the role of sodium in heart healthy nutrition, utilization of the nutrition label, and utilization of this knowledge for meal planning. Follow up email sent as well. Written material given at graduation. [facility]  Education need identified 08/05/20  Date 10/08/20  Educator [facility]ruction Review Code 1- Verbalizes Understanding        Biometrics:  Pre Biometrics - 08/05/20 0908       Pre Biometrics   Height 5' 2.1" (1.577 m)    Weight 166 lb (75.3 kg)    BMI (Calculated) 30.28    Single Leg Stand 0.8 seconds              Nutrition Therapy Plan and Nutrition Goals:  Nutrition Therapy & Goals - 08/20/20 1531       Nutrition Therapy   Diet Heart healthy, low Na, diabetes friendly eating.    Drug/Food Interactions Statins/Certain Fruits    Protein (specify units) 60g    Fiber 25 grams    Whole Grain Foods 3 servings    Saturated Fats 12 max. grams    Sodium 1.5 grams      Personal Nutrition Goals   Nutrition Goal ST: choose more non- starchy vegetables at meals, rinse off canned food with water and cloose low sodium or no salt added LT: balance meals using MyPlate method. limit salt intake to <1.5g/day    Comments B: yogurt with banana with water and medications S: walnuts or peanut butter or cheese crackers or glucerna L: salad and bottle of water and handful of walnuts or glucerna D:baked chicken, corn, cabbage, and dinner roll (did not finish). Meat (fish or shrimp, chicken, ground meat 1x/week, pork chop - typically meat is baked or missed in with other foods) and vegetable and CHO (mashed potatoes, corn, dinner rolls).  She mentioned she only has hot dogs 1x/month. She does not like collards or mixed green or eggplant or brussells sprouts. She eats lots of fruit. Discussed heart healhty eating and diabetes friendly eating.      Intervention Plan   Intervention Prescribe, educate and counsel regarding individualized specific dietary modifications aiming towards targeted core components such  as weight, hypertension, lipid management, diabetes, heart failure and other comorbidities.    Expected Outcomes Short Term Goal: Understand basic principles of dietary content, such as calories, fat, sodium, cholesterol and nutrients.;Short Term Goal: A plan has been developed with personal nutrition goals set during  dietitian appointment.;Long Term Goal: Adherence to prescribed nutrition plan.             Nutrition Assessments:  MEDIFICTS Score Key: ?70 Need to make dietary changes  40-70 Heart Healthy Diet ? 40 Therapeutic Level Cholesterol Diet  Flowsheet Row Cardiac Rehab from 10/29/2020 in [facility] Cardiac and Pulmonary Rehab  Picture Your Plate Total Score on Admission 65  Picture Your Plate Total Score on Discharge 51      Picture Your Plate Scores: <30 Unhealthy dietary pattern with much room for improvement. 41-50 Dietary pattern unlikely to meet recommendations for good health and room for improvement. 51-60 More healthful dietary pattern, with some room for improvement.  >60 Healthy dietary pattern, although there may be some specific behaviors that could be improved.    Nutrition Goals Re-Evaluation:  Nutrition Goals Re-Evaluation     [place] Name 10/01/20 [phone]             Goals   Nutrition Goal ST: choose more non- starchy vegetables at meals, rinse off canned food with water and cloose low sodium or no salt added LT: balance meals using MyPlate method. limit salt intake to <1.5g/day       Comment Megin is doing well with her diet.  She is trying to get a better variety of vegetables in her salad.  She is working on balancing her meals better.  She still cheats on occasion and had [employer] for dinner last night with her grandkids.  She is watching her sodium more closely.       Expected Outcome Short: Limit eating out more Long: Continue to focus on healthy eating and more variety                Nutrition Goals Discharge (Final Nutrition Goals Re-Evaluation):  Nutrition Goals Re-Evaluation - 10/01/20 0752       Goals   Nutrition Goal ST: choose more non- starchy vegetables at meals, rinse off canned food with water and cloose low sodium or no salt added LT: balance meals using MyPlate method. limit salt intake to <1.5g/day    Comment Gracey is doing well with her diet.  She is  trying to get a better variety of vegetables in her salad.  She is working on balancing her meals better.  She still cheats on occasion and had [employer] for dinner last night with her grandkids.  She is watching her sodium more closely.    Expected Outcome Short: Limit eating out more Long: Continue to focus on healthy eating and more variety             Psychosocial: Target Goals: Acknowledge presence or absence of significant depression and/or stress, maximize coping skills, provide positive support system. Participant is able to verbalize types and ability to use techniques and skills needed for reducing stress and depression.   Education: Stress, Anxiety, and Depression - Group verbal and visual presentation to define topics covered.  Reviews how body is impacted by stress, anxiety, and depression.  Also discusses healthy ways to reduce stress and to treat/manage anxiety and depression.  Written material given at graduation. Flowsheet Row Cardiac Rehab from 02/08/2018 in [place] Cardiac and Pulmonary Rehab  Date 12/28/17  Educator Lupita Leash  Instruction Review Code 5- Refused Teaching       Education: Sleep Hygiene -Provides group verbal and written instruction about how sleep can affect your health.  Define sleep hygiene, discuss sleep cycles and impact of sleep habits. Review good sleep hygiene tips.  Flowsheet Row Cardiac Rehab from 02/08/2018 in [facility] Cardiac and Pulmonary Rehab  Date 02/08/18  Educator [facility]  Instruction Review Code 1- Verbalizes Understanding       Initial Review & Psychosocial Screening:  Initial Psych Review & Screening - 08/01/20 1520       Initial Review   Current issues with History of Depression;Current Sleep Concerns;Current Stress Concerns    Source of Stress Concerns Chronic Illness;Unable to perform yard/household activities;Unable to participate in former interests or hobbies      [place]? Yes      Barriers    Psychosocial barriers to participate in program The patient should benefit from training in stress management and relaxation.;Psychosocial barriers identified (see note)      Screening Interventions   Interventions Encouraged to exercise;Provide feedback about the scores to participant;To provide support and resources with identified psychosocial needs    Expected Outcomes Short Term goal: Utilizing psychosocial counselor, staff and physician to assist with identification of specific Stressors or current issues interfering with healing process. Setting desired goal for each stressor or current issue identified.;Long Term Goal: Stressors or current issues are controlled or eliminated.;Long Term goal: The participant improves quality of Life and PHQ9 Scores as seen by post scores and/or verbalization of changes;Short Term goal: Identification and review with participant of any Quality of Life or Depression concerns found by scoring the questionnaire.             Quality of Life Scores:   Quality of Life - 10/29/20 0847       Quality of Life   Select Quality of Life      Quality of Life Scores   Health/Function Pre 17.32 %    Health/Function Post 21.46 %    Health/Function % Change 23.9 %    Socioeconomic Pre 18.21 %    Socioeconomic Post 21.25 %    Socioeconomic % Change  16.69 %    Psych/Spiritual Pre 23.79 %    Psych/Spiritual Post 24.21 %    Psych/Spiritual % Change 1.77 %    Family Pre 29.35 %    Family Post 30 %    Family % Change 2.21 %    GLOBAL Pre 20.44 %    GLOBAL Post 23.15 %    GLOBAL % Change 13.26 %            Scores of 19 and below usually indicate a poorer quality of life in these areas.  A difference of  2-3 points is a clinically meaningful difference.  A difference of 2-3 points in the total score of the Quality of Life Index has been associated with significant improvement in overall quality of life, self-image, physical symptoms, and general health in  studies assessing change in quality of life.  PHQ-9: Recent Review Flowsheet Data     Depression screen [facility] 2/9 10/29/2020 08/05/2020 04/15/2020 04/02/2020 02/06/2020   Decreased Interest 0 1 0 0 1   Down, Depressed, Hopeless 0 1 0 0 0   PHQ - 2 Score 0 2 0 0 1   Altered sleeping 1 1 - - 1   Tired, decreased energy 1 1 - - 1   Change in appetite  0 1 - - 1   Feeling bad or failure about yourself  0 0 - - 0   Trouble concentrating 0 0 - - 0   Moving slowly or fidgety/restless 1 0 - - 0   Suicidal thoughts 0 0 - - 0   PHQ-9 Score 3 5 - - 4   Difficult doing work/chores Not difficult at all Not difficult at all - - Not difficult at all      Interpretation of Total Score  Total Score Depression Severity:  1-4 = Minimal depression, 5-9 = Mild depression, 10-14 = Moderate depression, 15-19 = Moderately severe depression, 20-27 = Severe depression   Psychosocial Evaluation and Intervention:  Psychosocial Evaluation - 08/01/20 1520       Psychosocial Evaluation & Interventions   Interventions Stress management education;Relaxation education;Encouraged to exercise with the program and follow exercise prescription    Comments Jone is returning to the program after completing it in 2019. She has had a NSTEMI this time. She states that this last year has been filled with medical issues and she is finally starting to feel better. She just started back driving and is looking forward to picking up her grandkids from school. Her family is her support system and she cherishes them. She doesn't report any symptoms of depression today and states she is only focused on seeing the positive side of things. She is excited to come back to the program and see some familiar faces. She is really wanting to maintain her independence and she knows this is the best way to do that    Expected Outcomes Short: attend cardiac rehab for education and exercise. Long: Develop positive self care habits.    Continue Psychosocial  Services  Follow up required by staff             Psychosocial Re-Evaluation:  Psychosocial Re-Evaluation     [place] Name 10/01/20 [phone]             Psychosocial Re-Evaluation   Current issues with None Identified;Current Psychotropic Meds       Comments Enslee is doing well in rehab.  She is feeling good mentally and stays positive.  She gets to see her great grands and grandkids more frequently now.  They all keep her busy. She sleeps pretty good and better on the days she exercises. Overall, she is doing well and feels well managed.       Expected Outcomes Short: Continue to stay positive.  Long: Continue to stay active.       Interventions Encouraged to attend Cardiac Rehabilitation for the exercise       Continue Psychosocial Services  Follow up required by staff                Psychosocial Discharge (Final Psychosocial Re-Evaluation):  Psychosocial Re-Evaluation - 10/01/20 0749       Psychosocial Re-Evaluation   Current issues with None Identified;Current Psychotropic Meds    Comments Maui is doing well in rehab.  She is feeling good mentally and stays positive.  She gets to see her great grands and grandkids more frequently now.  They all keep her busy. She sleeps pretty good and better on the days she exercises. Overall, she is doing well and feels well managed.    Expected Outcomes Short: Continue to stay positive.  Long: Continue to stay active.    Interventions Encouraged to attend Cardiac Rehabilitation for the exercise    Continue Psychosocial Services  Follow up  required by staff             Vocational Rehabilitation: Provide vocational rehab assistance to qualifying candidates.   Vocational Rehab Evaluation & Intervention:  Vocational Rehab - 08/01/20 1520       Initial Vocational Rehab Evaluation & Intervention   Assessment shows need for Vocational Rehabilitation No             Education: Education Goals: Education classes will be provided on a  variety of topics geared toward better understanding of heart health and risk factor modification. Participant will state understanding/return demonstration of topics presented as noted by education test scores.  Learning Barriers/Preferences:  Learning Barriers/Preferences - 08/01/20 1519       Learning Barriers/Preferences   Learning Barriers None    Learning Preferences None             General Cardiac Education Topics:  AED/CPR: - Group verbal and written instruction with the use of models to demonstrate the basic use of the AED with the basic ABC's of resuscitation. Flowsheet Row Cardiac Rehab from 02/08/2018 in [facility] Cardiac and Pulmonary Rehab  Date 01/26/18  Educator MA  Instruction Review Code 1- Verbalizes Understanding       Anatomy and Cardiac Procedures: - Group verbal and visual presentation and models provide information about basic cardiac anatomy and function. Reviews the testing methods done to diagnose heart disease and the outcomes of the test results. Describes the treatment choices: Medical Management, Angioplasty, or Coronary Bypass Surgery for treating various heart conditions including Myocardial Infarction, Angina, Valve Disease, and Cardiac Arrhythmias.  Written material given at graduation. Flowsheet Row Cardiac Rehab from 10/22/2020 in [facility] Cardiac and Pulmonary Rehab  Education need identified 08/05/20       Medication Safety: - Group verbal and visual instruction to review commonly prescribed medications for heart and lung disease. Reviews the medication, class of the drug, and side effects. Includes the steps to properly store meds and maintain the prescription regimen.  Written material given at graduation. Flowsheet Row Cardiac Rehab from 10/22/2020 in [facility] Cardiac and Pulmonary Rehab  Date 10/15/20  Educator SB  Instruction Review Code 1- Verbalizes Understanding       Intimacy: - Group verbal instruction through game format to discuss  how heart and lung disease can affect sexual intimacy. Written material given at graduation.. Flowsheet Row Cardiac Rehab from 10/22/2020 in [facility] Cardiac and Pulmonary Rehab  Date 09/17/20  Educator [facility]  Instruction Review Code 1- Verbalizes Understanding       Know Your Numbers and Heart Failure: - Group verbal and visual instruction to discuss disease risk factors for cardiac and pulmonary disease and treatment options.  Reviews associated critical values for Overweight/Obesity, Hypertension, Cholesterol, and Diabetes.  Discusses basics of heart failure: signs/symptoms and treatments.  Introduces Heart Failure Zone chart for action plan for heart failure.  Written material given at graduation. Flowsheet Row Cardiac Rehab from 10/22/2020 in [facility] Cardiac and Pulmonary Rehab  Date 08/20/20  Educator [facility]  Instruction Review Code 1- Verbalizes Understanding       Infection Prevention: - Provides verbal and written material to individual with discussion of infection control including proper hand washing and proper equipment cleaning during exercise session. Flowsheet Row Cardiac Rehab from 10/22/2020 in [facility] Cardiac and Pulmonary Rehab  Date 08/05/20  Educator [facility]  Instruction Review Code 1- Verbalizes Understanding       Falls Prevention: - Provides verbal and written material to individual with discussion of falls prevention  and safety. Flowsheet Row Cardiac Rehab from 10/22/2020 in [facility] Cardiac and Pulmonary Rehab  Date 08/05/20  Educator [facility]  Instruction Review Code 1- Verbalizes Understanding       Other: -Provides group and verbal instruction on various topics (see comments)   Knowledge Questionnaire Score:  Knowledge Questionnaire Score - 10/29/20 0847       Knowledge Questionnaire Score   Pre Score 22/26 Education Focus: Angina, Nutrition, Exercise    Post Score 25/26             Core Components/Risk Factors/Patient Goals at Admission:  Personal Goals and Risk  Factors at Admission - 08/05/20 0910       Core Components/Risk Factors/Patient Goals on Admission    Weight Management Yes;Obesity;Weight Loss    Intervention Weight Management: Develop a combined nutrition and exercise program designed to reach desired caloric intake, while maintaining appropriate intake of nutrient and fiber, sodium and fats, and appropriate energy expenditure required for the weight goal.;Weight Management: Provide education and appropriate resources to help participant work on and attain dietary goals.;Weight Management/Obesity: Establish reasonable short term and long term weight goals.;Obesity: Provide education and appropriate resources to help participant work on and attain dietary goals.    Admit Weight 166 lb (75.3 kg)    Goal Weight: Short Term 160 lb (72.6 kg)    Goal Weight: Long Term 155 lb (70.3 kg)    Expected Outcomes Short Term: Continue to assess and modify interventions until short term weight is achieved;Long Term: Adherence to nutrition and physical activity/exercise program aimed toward attainment of established weight goal;Weight Loss: Understanding of general recommendations for a balanced deficit meal plan, which promotes 1-2 lb weight loss per week and includes a negative energy balance of [phone] kcal/d;Understanding recommendations for meals to include 15-35% energy as protein, 25-35% energy from fat, 35-60% energy from carbohydrates, less than 257m of dietary cholesterol, 20-35 gm of total fiber daily;Understanding of distribution of calorie intake throughout the day with the consumption of 4-5 meals/snacks    Diabetes Yes    Intervention Provide education about signs/symptoms and action to take for hypo/hyperglycemia.;Provide education about proper nutrition, including hydration, and aerobic/resistive exercise prescription along with prescribed medications to achieve blood glucose in normal ranges: Fasting glucose 65-99 mg/dL    Expected Outcomes Short  Term: Participant verbalizes understanding of the signs/symptoms and immediate care of hyper/hypoglycemia, proper foot care and importance of medication, aerobic/resistive exercise and nutrition plan for blood glucose control.;Long Term: Attainment of HbA1C < 7%.    Heart Failure Yes    Intervention Provide a combined exercise and nutrition program that is supplemented with education, support and counseling about heart failure. Directed toward relieving symptoms such as shortness of breath, decreased exercise tolerance, and extremity edema.    Expected Outcomes Improve functional capacity of life;Short term: Attendance in program 2-3 days a week with increased exercise capacity. Reported lower sodium intake. Reported increased fruit and vegetable intake. Reports medication compliance.;Short term: Daily weights obtained and reported for increase. Utilizing diuretic protocols set by physician.;Long term: Adoption of self-care skills and reduction of barriers for early signs and symptoms recognition and intervention leading to self-care maintenance.    Hypertension Yes    Intervention Provide education on lifestyle modifcations including regular physical activity/exercise, weight management, moderate sodium restriction and increased consumption of fresh fruit, vegetables, and low fat dairy, alcohol moderation, and smoking cessation.;Monitor prescription use compliance.    Expected Outcomes Short Term: Continued assessment and intervention until BP is < 140/952m  HG in hypertensive participants. < 130/8m HG in hypertensive participants with diabetes, heart failure or chronic kidney disease.;Long Term: Maintenance of blood pressure at goal levels.    Lipids Yes    Intervention Provide education and support for participant on nutrition & aerobic/resistive exercise along with prescribed medications to achieve LDL <766m HDL >4020m   Expected Outcomes Short Term: Participant states understanding of desired  cholesterol values and is compliant with medications prescribed. Participant is following exercise prescription and nutrition guidelines.;Long Term: Cholesterol controlled with medications as prescribed, with individualized exercise RX and with personalized nutrition plan. Value goals: LDL < 60m67mDL > 40 mg.             Education:Diabetes - Individual verbal and written instruction to review signs/symptoms of diabetes, desired ranges of glucose level fasting, after meals and with exercise. Acknowledge that pre and post exercise glucose checks will be done for 3 sessions at entry of program. [facility]  Date 08/01/20  [facility]- Verbalizes Understanding       Core Components/Risk Factors/Patient Goals Review:   Goals and Risk Factor Review     Row Name 10/01/20 0755             Core Components/Risk Factors/Patient Goals Review   Personal Goals Review Weight Management/Obesity;Hypertension;Diabetes;Lipids;Heart Failure       Review Mirjana Brittindoing well in rehab.  Her weight is staying steady. She denies any heart failure symptoms currently.  Her sugars are doing well around 187. She is trying to get better with it and taking trulicity.  Her pressures are doing well and she checks them at home on occassion. We talked about making a habit of checking numbers again to be able to take to doctor.       Expected Outcomes Short: Start recording numbers at home Long: Continue to work on diabetes management.                Core Components/Risk Factors/Patient Goals at Discharge (Final Review):   Goals and Risk Factor Review - 10/01/20 0755       Core Components/Risk Factors/Patient Goals Review   Personal Goals Review Weight Management/Obesity;Hypertension;Diabetes;Lipids;Heart Failure    Review Ricquel Lugenedoing well in rehab.  Her weight is staying steady. She denies any heart failure  symptoms currently.  Her sugars are doing well around 187. She is trying to get better with it and taking trulicity.  Her pressures are doing well and she checks them at home on occassion. We talked about making a habit of checking numbers again to be able to take to doctor.    Expected Outcomes Short: Start recording numbers at home Long: Continue to work on diabetes management.             ITP Comments:  ITP Comments     Row Name 08/01/20 1507 08/05/20 0903 08/13/20 0759 08/18/20 0857 08/20/20 1530   ITP Comments Initial telephone orientation completed. Diagnosis can be found in [facility]. EP orientation scheduled for Tuesday 5/10 at 8am. Completed 6MWT and gym orientation. Initial ITP created and sent for review to Dr. MarkEmily Filbertdical Director. 30 Day review completed. Medical Director ITP review done, changes made as directed, and signed approval by Medical Director.   New to program First full day of exercise!  Patient was oriented to gym and equipment including functions, settings, policies, and procedures.  Patient's individual  exercise prescription and treatment plan were reviewed.  All starting workloads were established based on the results of the 6 minute walk test done at initial orientation visit.  The plan for exercise progression was also introduced and progression will be customized based on patient's performance and goals.  Jadyn has attended the program in the past Completed initial RD evaluation    Row Name 09/08/20 1628 09/10/20 0908 09/24/20 0845 10/08/20 1140 10/08/20 1141   ITP Comments Unable to complete Saavi's goals this review. Only attended 4 times since starting in May. 30 Day review completed. Medical Director ITP review done, changes made as directed, and signed approval by Medical Director. Pt completed 20 laps on the track and then needed to rest. Pt unable to continue exercising today due to 7/10 tightness in chest/chest pain. Pt BP checked, 122/76, and advised to  take nitro per her prescription. Pt BP recheck at 92/70 with headache, nausea, and chest pain increasing per patient (pain described as stabbing and sharp at times, pressure, and discomfort). Pt was taken to emergency room by Cyril Mourning, RN, for further evaluation. 30 Day review completed. Medical Director ITP review done, changes made as directed, and signed approval by Medical Director. Meredeth's physician has released her to CR with no restrictions.   Terisa stated that she was told it may be scar tissue causing the pain.    [place] Name 10/31/20 0826 11/05/20 0901         ITP Comments Dailee graduated today from  rehab with 30 sessions completed.  Details of the patient's exercise prescription and what She needs to do in order to continue the prescription and progress were discussed with patient.  Patient was given a copy of prescription and goals.  Patient verbalized understanding.  Amyre plans to continue to exercise by walking at home. Discharge completed               Comments: Discharge ITP

## 2020-11-07 ENCOUNTER — Ambulatory Visit: Payer: Self-pay

## 2020-11-07 DIAGNOSIS — I5032 Chronic diastolic (congestive) heart failure: Secondary | ICD-10-CM

## 2020-11-09 ENCOUNTER — Other Ambulatory Visit: Payer: Self-pay | Admitting: Family Medicine

## 2020-11-09 DIAGNOSIS — E1165 Type 2 diabetes mellitus with hyperglycemia: Secondary | ICD-10-CM

## 2020-11-09 NOTE — Telephone Encounter (Signed)
Requested Prescriptions  Pending Prescriptions Disp Refills  . TRULICITY 1.5 0000000 SOPN [Pharmacy Med Name: Trulicity 1.5 99991111 mL subcutaneous pen injector] 2 mL 2    Sig: Inject 1.5 mg into the skin once a week.     Endocrinology:  Diabetes - GLP-1 Receptor Agonists Failed - 11/09/2020  8:01 AM      Failed - HBA1C is between 0 and 7.9 and within 180 days    Hemoglobin A1C  Date Value Ref Range Status  07/10/2019 9.2  Final   Hgb A1c MFr Bld  Date Value Ref Range Status  08/11/2020 9.1 (H) 4.8 - 5.6 % Final    Comment:             Prediabetes: 5.7 - 6.4          Diabetes: >6.4          Glycemic control for adults with diabetes: <7.0          Passed - Valid encounter within last 6 months    Recent Outpatient Visits          3 months ago Uncontrolled type 2 diabetes mellitus with hyperglycemia [facility])   [facility] [name]., MD   6 months ago Viral URI   [facility] [name]., MD   6 months ago Esophageal dysphagia   [facility] [name]., MD   9 months ago Type 2 diabetes mellitus with complication, without long-term current use of insulin [facility])   [facility] [name]., MD   11 months ago Type 2 diabetes mellitus with complication, without long-term current use of insulin [facility])   [facility] [name]., MD      Future Appointments            In 1 week [name]., MD [facility]

## 2020-11-10 NOTE — Chronic Care Management (AMB) (Signed)
Chronic Care Management   CCM RN Visit Note   Name: Candace [name] MRN: [id number] DOB: March 31, 1950  Subjective: Candace [name] is a 70 y.o. year old female who is a primary care patient of [name]., MD. The care management team was consulted for assistance with disease management and care coordination needs.    Care Coordination was conducted today in response to patient's request for medication assistance.  Consent to Services:  The patient was given information about Chronic Care Management services, agreed to services, and gave verbal consent prior to initiation of services.  Please see initial visit note for detailed documentation.     Assessment: Review of patient past medical history, allergies, medications, health status, including review of consultants reports, laboratory and other test data, was performed as part of comprehensive evaluation and provision of chronic care management services.   SDOH (Social Determinants of Health) assessments and interventions performed:    CCM Care Plan  Allergies  Allergen Reactions   Lisinopril Cough   Penicillins Swelling, Rash and Other (See Comments)    Did it involve swelling of the face/tongue/throat, SOB, or low BP? Yes Did it involve sudden or severe rash/hives, skin peeling, or any reaction on the inside of your mouth or nose? Yes Did you need to seek medical attention at a hospital or doctor's office? Yes When did it last happen?      15 years If all above answers are "NO", may proceed with cephalosporin use.     Outpatient Encounter Medications as of 11/07/2020  Medication Sig Note   acetaminophen (TYLENOL) 325 MG tablet Take 650 mg by mouth every 6 (six) hours as needed for moderate pain or headache.     bismuth subsalicylate (PEPTO BISMOL) 262 MG chewable tablet Chew 524 mg by mouth as needed.    clopidogrel (PLAVIX) 75 MG tablet Take 1 tablet (75 mg total) by mouth daily.    ELIQUIS 5 MG TABS tablet Take 5  mg by mouth 2 (two) times daily. 06/06/2020: Prescribed by Dr. Nehemiah Massed   ezetimibe (ZETIA) 10 MG tablet Take 1 tablet (10 mg total) by mouth daily.    isosorbide mononitrate (IMDUR) 30 MG 24 hr tablet Take 30 mg by mouth daily.    JARDIANCE 25 MG TABS tablet TAKE ONE TABLET BY MOUTH BEFORE BREAKFAST    Lancets (ONETOUCH DELICA PLUS 123XX123) MISC USE UP TO 4 TIMES DAILY AS DIRECTED    loperamide (IMODIUM) 2 MG capsule Take 2 mg by mouth as needed.    losartan (COZAAR) 25 MG tablet Take 1 tablet (25 mg total) by mouth daily.    metoprolol tartrate (LOPRESSOR) 100 MG tablet Take 100 mg by mouth 2 (two) times daily. 06/06/2020: Prescribed by Dr. Nehemiah Massed    nitroGLYCERIN (NITROSTAT) 0.4 MG SL tablet Place 1 tablet (0.4 mg total) under the tongue every 5 (five) minutes as needed for chest pain.    nystatin (NYSTATIN) powder Apply 1 application topically daily. (Patient taking differently: Apply 1 application topically daily as needed.)    ondansetron (ZOFRAN-ODT) 8 MG disintegrating tablet Take 1 tablet (8 mg total) by mouth every 8 (eight) hours as needed.    pantoprazole (PROTONIX) 20 MG tablet Take 1 tablet (20 mg total) by mouth 2 (two) times daily.    rosuvastatin (CRESTOR) 40 MG tablet Take 1 tablet (40 mg total) by mouth daily.    sertraline (ZOLOFT) 50 MG tablet Take 1 tablet (50 mg total) by mouth daily.    [  DISCONTINUED] Dulaglutide (TRULICITY) 1.5 0000000 SOPN Inject 1.5 mg into the skin once a week.    No facility-administered encounter medications on file as of 11/07/2020.    Patient Active Problem List   Diagnosis Date Noted   Vertigo    Unsteady gait    Acute bronchitis 03/07/2019   Esophageal dysphagia    Stomach irritation    Gastric polyp    Esophageal lump    Hx of colonic polyp    Polyp of colon    Diverticulosis of large intestine without diverticulitis    PSVT (paroxysmal supraventricular tachycardia) ([place]) 10/03/2018   Abnormal ECG 07/05/2018   Tachycardia  03/27/2018   Pain in limb 02/28/2018   Chronic diastolic CHF (congestive heart failure) ([place]) 11/03/2017   TIA (transient ischemic attack) 11/03/2017   COPD suggested by initial evaluation ([place]) 08/23/2017   Acute and chronic respiratory failure with hypoxia ([place]) 08/23/2017   Postoperative state 08/15/2017   Incidental pulmonary nodule 08/01/2017   Non-ST elevation myocardial infarction (NSTEMI), subendocardial infarction, subsequent episode of care ([place]) 06/01/2017   B12 deficiency 12/01/2016   Vitamin D deficiency 11/04/2016   Depression with anxiety 09/30/2016   AF (paroxysmal atrial fibrillation) ([place]) 09/30/2016   Resting tremor 09/30/2016   Mild obstructive sleep apnea 09/30/2016   Headache 08/18/2016   Unstable angina ([place]) 08/03/2016   Type 2 diabetes mellitus with complication, without long-term current use of insulin ([place]) 08/03/2016   Chronic daily headache 05/05/2016   Asthma 11/11/2015   S/P CABG x 4 11/10/2015   Coronary artery disease 11/07/2015   Carotid artery narrowing 02/08/2014   Chest pain 02/08/2014   Mixed hyperlipidemia 02/08/2014   Bilateral carotid artery stenosis 02/08/2014   Atypical chest pain 02/07/2014   Essential hypertension 02/07/2014   Awareness of heartbeats 02/07/2014   D (diarrhea) 10/05/2013   Gastroesophageal reflux disease 10/05/2013    Conditions to be addressed/monitored:CHF, HTN, and Medications  Patient Care Plan: Heart Failure (Adult)     Problem Identified: Symptom Exacerbation (Heart Failure)   Priority: High  Onset Date: 04/15/2020     Long-Range Goal: Symptom Exacerbation Prevented or Minimized   Start Date: 09/09/2020  Expected End Date: 01/07/2021  Recent Progress: On track  Priority: High  Note:    Current Barriers:  Chronic Disease Management support and educational needs r/t CHF.  Case Manager Clinical Goal(s):  Over the next 120 days, patient will not require hospitalization or emergent evaluation d/t  complications r/t CHF exacerbation.  Interventions:  Collaboration with [name]., MD regarding development and update of comprehensive plan of care as evidenced by provider attestation and co-signature Inter-disciplinary care team collaboration (see longitudinal plan of care) Reviewed medications and current plan for CHF management. Reports excellent compliance with treatment plan. Denies edema or s/sx of fluid overload. Denies changes in activity tolerance but reports slight change in mobility due to discomfort in her toe. Reviewed worsening symptoms r/t CHF exacerbation and indications for seeking immediate medical attention.  Requested assistance with ensuring all medications can be obtained in [place]. She anticipates being out of the area towards the end of the month. Anticipates returning to [facility] for medical appointments as scheduled. Requested assistance with ensuring all medications are able to be filled for several months or obtained at a pharmacy in [place]. [facility] and [facility] were contacted.    Patient Goals/Self-Care Activities:  Take medications as prescribed Follow plan for CHF symptom management and notify provider with concerns Monitor and record weights Adhere to  recommended cardiac diet Notify provider or care management team with questions or new concerns as needed    Follow Up Plan:  Will follow up next month      PLAN A member of the care management team will follow up with Ms. Gassert within the next month.   [name] Care Management [facility] [phone]

## 2020-11-11 ENCOUNTER — Ambulatory Visit: Payer: Medicaid Other | Admitting: Family Medicine

## 2020-11-12 ENCOUNTER — Other Ambulatory Visit: Payer: Self-pay | Admitting: Family Medicine

## 2020-11-12 DIAGNOSIS — E1159 Type 2 diabetes mellitus with other circulatory complications: Secondary | ICD-10-CM

## 2020-11-12 NOTE — Telephone Encounter (Signed)
Last ser creatine 10/20/20 future visit in 1 week

## 2020-11-13 ENCOUNTER — Telehealth: Payer: Self-pay

## 2020-11-13 ENCOUNTER — Ambulatory Visit: Payer: Self-pay

## 2020-11-13 DIAGNOSIS — L600 Ingrowing nail: Secondary | ICD-10-CM | POA: Diagnosis not present

## 2020-11-13 DIAGNOSIS — E1140 Type 2 diabetes mellitus with diabetic neuropathy, unspecified: Secondary | ICD-10-CM | POA: Diagnosis not present

## 2020-11-13 DIAGNOSIS — I6523 Occlusion and stenosis of bilateral carotid arteries: Secondary | ICD-10-CM | POA: Diagnosis not present

## 2020-11-13 DIAGNOSIS — E1169 Type 2 diabetes mellitus with other specified complication: Secondary | ICD-10-CM

## 2020-11-13 DIAGNOSIS — L03032 Cellulitis of left toe: Secondary | ICD-10-CM | POA: Diagnosis not present

## 2020-11-13 DIAGNOSIS — E118 Type 2 diabetes mellitus with unspecified complications: Secondary | ICD-10-CM

## 2020-11-13 DIAGNOSIS — R42 Dizziness and giddiness: Secondary | ICD-10-CM | POA: Diagnosis not present

## 2020-11-13 NOTE — Progress Notes (Signed)
Chronic Care Management Pharmacy Assistant   Name: Candace [name]  MRN: [id number] DOB: 1950/12/11  Reason for Encounter: Medication Review  Recent office visits:  11/07/2020 Neldon Labella RN (CCM) No Medication Changes noted 10/31/2020 Neldon Labella RN (CCM) No Medication Changes noted 10/24/2020 Neldon Labella RN (CCM) No Medication Changes noted  Recent consult visits:   11/05/2020 Heath Lark RN [facility]) No Medication Changes Noted 10/31/2020 Heath Lark RN [facility]) No Medication Changes Noted 10/29/2020 Randon Goldsmith RN [facility] Cardiac [facility]) No Medication Changes noted 10/27/2020 Dr.Cline MD (Podiatry) start doxycycline 100 mg for 10 days 10/27/2020 Heath Lark RN [facility] Cardiac and Pulmonary Rehab) No Medication Changes Noted 10/24/2020 Heath Lark RN [facility] Cardiac [facility]) No Medication Changes Noted 10/22/2020 Johnna Acosta RN [facility] Cardiac [facility] Re[facility]) No Medication Changes Noted 10/15/2020 Johnna Acosta RN [facility] Cardiac and Pulmonary Rehab) No Medication Changes Noted 10/13/2020 Heath Lark RN [facility] Cardiac and Pulmonary [facility]) No Medication [facility] visits:  None in previous 6 months  Medications: Outpatient Encounter Medications as of 11/13/2020  Medication Sig Note   acetaminophen (TYLENOL) 325 MG tablet Take 650 mg by mouth every 6 (six) hours as needed for moderate pain or headache.     bismuth subsalicylate (PEPTO BISMOL) 262 MG chewable tablet Chew 524 mg by mouth as needed.    clopidogrel (PLAVIX) 75 MG tablet Take 1 tablet (75 mg total) by mouth daily.    ELIQUIS 5 MG TABS tablet Take 5 mg by mouth 2 (two) times daily. 06/06/2020: Prescribed by Dr. Nehemiah Massed   ezetimibe (ZETIA) 10 MG tablet Take 1 tablet (10 mg total) by mouth daily.    isosorbide mononitrate (IMDUR) 30 MG 24 hr tablet Take 30 mg by mouth daily.    JARDIANCE 25 MG TABS tablet TAKE  ONE TABLET BY MOUTH BEFORE BREAKFAST    Lancets (ONETOUCH DELICA PLUS 123XX123) MISC USE UP TO 4 TIMES DAILY AS DIRECTED    loperamide (IMODIUM) 2 MG capsule Take 2 mg by mouth as needed.    losartan (COZAAR) 25 MG tablet TAKE ONE TABLET BY MOUTH EVERYDAY AT BEDTIME    metoprolol tartrate (LOPRESSOR) 100 MG tablet Take 100 mg by mouth 2 (two) times daily. 06/06/2020: Prescribed by Dr. Nehemiah Massed    nitroGLYCERIN (NITROSTAT) 0.4 MG SL tablet Place 1 tablet (0.4 mg total) under the tongue every 5 (five) minutes as needed for chest pain.    nystatin (NYSTATIN) powder Apply 1 application topically daily. (Patient taking differently: Apply 1 application topically daily as needed.)    ondansetron (ZOFRAN-ODT) 8 MG disintegrating tablet Take 1 tablet (8 mg total) by mouth every 8 (eight) hours as needed.    pantoprazole (PROTONIX) 20 MG tablet Take 1 tablet (20 mg total) by mouth 2 (two) times daily.    rosuvastatin (CRESTOR) 40 MG tablet Take 1 tablet (40 mg total) by mouth daily.    sertraline (ZOLOFT) 50 MG tablet Take 1 tablet (50 mg total) by mouth daily.    TRULICITY 1.5 0000000 SOPN Inject 1.5 mg into the skin once a week.    [DISCONTINUED] cetirizine (ZYRTEC) 5 MG tablet Take 2 tablets (10 mg total) by mouth daily.    No facility-administered encounter medications on file as of 11/13/2020.    Care Gaps: Mammogram COVID 19 Vaccine Ophthalmology Exam Tetanus Shingrix Foot Exam Influenza Vaccine  Star Rating Drugs: 06/18/2020 Atorvastatin 40 mg last filled  for a 30-day supply at [place] 10/20/2020 Rosuvastatin 40 mg last filled for a 90- Day supply at [place] 10/20/2020 Losartan 25 mg last filled for a 30-Day supply at [place] 10/20/2020 Jardiance 25 mg last filled for a 10-Day Supply at [facility] 123XX123 Trulicity 1.5 mg last filled for a 30-Day supply at [place] Gaps: None ID    Reviewed chart for medication changes ahead of  medication coordination call.  BP Readings from Last 3 Encounters:  10/05/20 135/80  09/24/20 (!) 165/89  08/11/20 108/76    Lab Results  Component Value Date   HGBA1C 9.1 (H) 08/11/2020     Patient obtains medications through Adherence Packaging  30 Days   Last adherence delivery included:  Isosorbide mononitrate 30 mg one tablet daily - Breakfast Pantoprazole 20 mg one tablet two times a day- breakfast, Evening meals Losartan 25 mg one tablet daily- Bedtime Eliquis 5 mg 1 tablet two times daily breakfast, evening meals Jardiance 25 mg 1 tablet daily before breakfast Rosuvastatin 40 mg 1 tablet daily - Breakfast  Patient declined medication last month  ezetimibe 10 mg 1 tablet daily breakfast - (receive 90 day supply from [facility] on 09/22/2020) Clopidogrel 75 mg 1 tablet daily breakfast (receive 90 day supply from [facility] on 09/22/2020) Sertraline 50 mg 1 tablet daily breakfast - (receive 90 day supply from [place] on 08/08/2020)  Patient is due for next adherence delivery on: 11/25/2020. Called patient and reviewed medications and coordinated delivery.  This delivery to include: Isosorbide mononitrate 30 mg one tablet daily - Breakfast Pantoprazole 20 mg one tablet two times a day- breakfast, Evening meals Losartan 25 mg one tablet daily- Bedtime Eliquis 5 mg 1 tablet two times daily breakfast, evening meals Jardiance 25 mg 1 tablet daily before breakfast Rosuvastatin 40 mg 1 tablet daily - Breakfast Sertraline 50 mg 1 tablet daily breakfast  Trulicity 1.5 99991111 ML Inject 1.5 mg into the skin once a week. (Thursday) Metoprolol tartrate 100 mg 1 tablet 2 times daily Breakfast and Evening Meals  Patient declined the following medications  ezetimibe 10 mg 1 tablet daily breakfast - (receive 90 day supply from [facility] on 09/22/2020) Clopidogrel 75 mg 1 tablet daily breakfast (receive 90 day supply from [facility] on 09/22/2020)  Patient needs refills  for Rosuvastatin, Eliquis (reach out to patient Cardiology to request refill for Eliquis 90 day supply to go to [facility] on 11/17/2020) .  Confirmed delivery date of 11/19/2020, advised patient that pharmacy will contact them the morning of delivery.  Per clinical Pharmacist,Can you reach out to her today or tomorrow and figure out which [facility] to?  Patient states she would like a 90 day supply to avoid [facility] and [facility] when she [facility].Patient states if she really needs to transfer pharmacy [facility] [address] [place], [place] [address].Informed the patient [facility] should be able to give her a 90 day supply.Notified Clinical Pharmacist.   [employer] Pharmacist Assistant [phone]

## 2020-11-13 NOTE — Chronic Care Management (AMB) (Signed)
Chronic Care Management   CCM RN Visit Note  11/13/2020 Name: Candace [name] MRN: [id number] DOB: 26-Jul-1950  Subjective: Candace [name] is a 70 y.o. year old female who is a primary care patient of [name]., MD. The care management team was consulted for assistance with disease management and care coordination needs.    Care Coordination was conducted today for follow up visit in response to provider referral for case management and care coordination services.   Consent to Services:  The patient was given information about Chronic Care Management services, agreed to services, and gave verbal consent prior to initiation of services.  Please see initial visit note for detailed documentation.    Assessment: Review of patient past medical history, allergies, medications, health status, including review of consultants reports, laboratory and other test data, was performed as part of comprehensive evaluation and provision of chronic care management services.   SDOH (Social Determinants of Health) assessments and interventions performed:  No  CCM Care Plan  Allergies  Allergen Reactions   Lisinopril Cough   Penicillins Swelling, Rash and Other (See Comments)    Did it involve swelling of the face/tongue/throat, SOB, or low BP? Yes Did it involve sudden or severe rash/hives, skin peeling, or any reaction on the inside of your mouth or nose? Yes Did you need to seek medical attention at a hospital or doctor's office? Yes When did it last happen?      15 years If all above answers are "NO", may proceed with cephalosporin use.     Outpatient Encounter Medications as of 11/13/2020  Medication Sig Note   acetaminophen (TYLENOL) 325 MG tablet Take 650 mg by mouth every 6 (six) hours as needed for moderate pain or headache.     bismuth subsalicylate (PEPTO BISMOL) 262 MG chewable tablet Chew 524 mg by mouth as needed.    clopidogrel (PLAVIX) 75 MG tablet Take 1 tablet (75 mg  total) by mouth daily.    ELIQUIS 5 MG TABS tablet Take 5 mg by mouth 2 (two) times daily. 06/06/2020: Prescribed by Dr. Nehemiah Massed   ezetimibe (ZETIA) 10 MG tablet Take 1 tablet (10 mg total) by mouth daily.    isosorbide mononitrate (IMDUR) 30 MG 24 hr tablet Take 30 mg by mouth daily.    JARDIANCE 25 MG TABS tablet TAKE ONE TABLET BY MOUTH BEFORE BREAKFAST    Lancets (ONETOUCH DELICA PLUS 123XX123) MISC USE UP TO 4 TIMES DAILY AS DIRECTED    loperamide (IMODIUM) 2 MG capsule Take 2 mg by mouth as needed.    losartan (COZAAR) 25 MG tablet TAKE ONE TABLET BY MOUTH EVERYDAY AT BEDTIME    metoprolol tartrate (LOPRESSOR) 100 MG tablet Take 100 mg by mouth 2 (two) times daily. 06/06/2020: Prescribed by Dr. Nehemiah Massed    nitroGLYCERIN (NITROSTAT) 0.4 MG SL tablet Place 1 tablet (0.4 mg total) under the tongue every 5 (five) minutes as needed for chest pain.    nystatin (NYSTATIN) powder Apply 1 application topically daily. (Patient taking differently: Apply 1 application topically daily as needed.)    ondansetron (ZOFRAN-ODT) 8 MG disintegrating tablet Take 1 tablet (8 mg total) by mouth every 8 (eight) hours as needed.    pantoprazole (PROTONIX) 20 MG tablet Take 1 tablet (20 mg total) by mouth 2 (two) times daily.    rosuvastatin (CRESTOR) 40 MG tablet Take 1 tablet (40 mg total) by mouth daily.    sertraline (ZOLOFT) 50 MG tablet Take 1 tablet (50  mg total) by mouth daily.    TRULICITY 1.5 0000000 SOPN Inject 1.5 mg into the skin once a week.    [DISCONTINUED] cetirizine (ZYRTEC) 5 MG tablet Take 2 tablets (10 mg total) by mouth daily.    No facility-administered encounter medications on file as of 11/13/2020.    Patient Active Problem List   Diagnosis Date Noted   Vertigo    Unsteady gait    Acute bronchitis 03/07/2019   Esophageal dysphagia    Stomach irritation    Gastric polyp    Esophageal lump    Hx of colonic polyp    Polyp of colon    Diverticulosis of large intestine without  diverticulitis    PSVT (paroxysmal supraventricular tachycardia) ([place]) 10/03/2018   Abnormal ECG 07/05/2018   Tachycardia 03/27/2018   Pain in limb 02/28/2018   Chronic diastolic CHF (congestive heart failure) ([place]) 11/03/2017   TIA (transient ischemic attack) 11/03/2017   COPD suggested by initial evaluation ([place]) 08/23/2017   Acute and chronic respiratory failure with hypoxia ([place]) 08/23/2017   Postoperative state 08/15/2017   Incidental pulmonary nodule 08/01/2017   Non-ST elevation myocardial infarction (NSTEMI), subendocardial infarction, subsequent episode of care ([place]) 06/01/2017   B12 deficiency 12/01/2016   Vitamin D deficiency 11/04/2016   Depression with anxiety 09/30/2016   AF (paroxysmal atrial fibrillation) ([place]) 09/30/2016   Resting tremor 09/30/2016   Mild obstructive sleep apnea 09/30/2016   Headache 08/18/2016   Unstable angina ([place]) 08/03/2016   Type 2 diabetes mellitus with complication, without long-term current use of insulin ([employer]) 08/03/2016   Chronic daily headache 05/05/2016   Asthma 11/11/2015   S/P CABG x 4 11/10/2015   Coronary artery disease 11/07/2015   Carotid artery narrowing 02/08/2014   Chest pain 02/08/2014   Mixed hyperlipidemia 02/08/2014   Bilateral carotid artery stenosis 02/08/2014   Atypical chest pain 02/07/2014   Essential hypertension 02/07/2014   Awareness of heartbeats 02/07/2014   D (diarrhea) 10/05/2013   Gastroesophageal reflux disease 10/05/2013    Conditions to be addressed/monitored:DMII and Medications Patient Care Plan: Diabetes Type 2 (Adult)     Problem Identified: Glycemic Management (Diabetes, Type 2)   Priority: High  Onset Date: 04/15/2020     Long-Range Goal: Glycemic Management Optimized   Start Date: 04/15/2020  Expected End Date: 08/13/2020  Recent Progress: On track  Priority: High  Note:    Current Barriers:  Chronic Disease Management support and educational needs r/t Diabetes self  management.   Case Manager Clinical Goal(s):  Over the next 120 days, patient will demonstrate improved adherence to prescribed treatment plan for Diabetes self management as evidenced by daily monitoring and recording of CBG, adherence to ADA/ carb modified diet and adherence to prescribed medication regimen.  Interventions:  Collaboration with Miguel Aschoff, MD regarding development and update of comprehensive plan of care as evidenced by provider attestation and co-signature Inter-disciplinary care team collaboration (see longitudinal plan of care) Reviewed recent blood glucose readings and compliance with current treatment plan. Reports significant improvements with fasting reading. Reports taking medications as prescribed and monitoring her blood glucose levels frequently as advised. Reviewed s/sx of hypoglycemia and hyperglycemia along with appropriate interventions. She is doing well with monitoring her intake and reading nutritional labels. She has made several improvements to her daily nutritional intake since the last outreach. Discussed need  to maintain and food diary to assist with monitoring intake. Her A1C is not at goal but has improved from 12.2 to 9.1%. Update 11/13/20: Medications/Trulicity.  Ms. Galan anticipates being in [place] for the next months. She plans to return for medical appointments as scheduled. Previously requested assistance with all ensuring medications would be available including deliveries from [employer]. Routine prescriptions will be available for pick up at [facility] in [place] as needed. Previously contacted [facility] regarding delivery of Trulicity. Message forwarded to the [facility] staff for additional assistance.    Patient Goals/Self-Care Activities Self administer medications as prescribed Attend all scheduled provider appointments Monitor blood glucose levels consistently and utilize recommended  interventions Monitor closely for s/sx of hyperglycemia and seek medical attention if needed Adhere to prescribed ADA/carb modified Notify provider or care management team with questions and new concerns as needed.   Follow Up Plan:  Will follow up within the next month     PLAN A member of the care management team will follow up with Ms. Folkerts within the next month.    [name] [facility] Care Management [facility] [phone]

## 2020-11-14 ENCOUNTER — Ambulatory Visit: Payer: Self-pay

## 2020-11-14 DIAGNOSIS — E118 Type 2 diabetes mellitus with unspecified complications: Secondary | ICD-10-CM

## 2020-11-14 NOTE — Chronic Care Management (AMB) (Signed)
Chronic Care Management   CCM RN Visit Note  11/14/2020 Name: Candace [name] MRN: [id number] DOB: Jan 24, 1951  Subjective: Candace [name] is a 70 y.o. year old female who is a primary care patient of [name]., MD. The care management team was consulted for assistance with disease management and care coordination needs.    Engaged with patient by telephone for follow up visit in response to provider referral for case management and care coordination services.   Consent to Services:  The patient was given information about Chronic Care Management services, agreed to services, and gave verbal consent prior to initiation of services.  Please see initial visit note for detailed documentation.    Assessment: Review of patient past medical history, allergies, medications, health status, including review of consultants reports, laboratory and other test data, was performed as part of comprehensive evaluation and provision of chronic care management services.   SDOH (Social Determinants of Health) assessments and interventions performed: No  CCM Care Plan  Allergies  Allergen Reactions   Lisinopril Cough   Penicillins Swelling, Rash and Other (See Comments)    Did it involve swelling of the face/tongue/throat, SOB, or low BP? Yes Did it involve sudden or severe rash/hives, skin peeling, or any reaction on the inside of your mouth or nose? Yes Did you need to seek medical attention at a hospital or doctor's office? Yes When did it last happen?      15 years If all above answers are "NO", may proceed with cephalosporin use.     Outpatient Encounter Medications as of 11/14/2020  Medication Sig Note   acetaminophen (TYLENOL) 325 MG tablet Take 650 mg by mouth every 6 (six) hours as needed for moderate pain or headache.     bismuth subsalicylate (PEPTO BISMOL) 262 MG chewable tablet Chew 524 mg by mouth as needed.    clopidogrel (PLAVIX) 75 MG tablet Take 1 tablet (75 mg total)  by mouth daily.    ELIQUIS 5 MG TABS tablet Take 5 mg by mouth 2 (two) times daily. 06/06/2020: Prescribed by Dr. Nehemiah Massed   ezetimibe (ZETIA) 10 MG tablet Take 1 tablet (10 mg total) by mouth daily.    isosorbide mononitrate (IMDUR) 30 MG 24 hr tablet Take 30 mg by mouth daily.    JARDIANCE 25 MG TABS tablet TAKE ONE TABLET BY MOUTH BEFORE BREAKFAST    Lancets (ONETOUCH DELICA PLUS 123XX123) MISC USE UP TO 4 TIMES DAILY AS DIRECTED    loperamide (IMODIUM) 2 MG capsule Take 2 mg by mouth as needed.    losartan (COZAAR) 25 MG tablet TAKE ONE TABLET BY MOUTH EVERYDAY AT BEDTIME    metoprolol tartrate (LOPRESSOR) 100 MG tablet Take 100 mg by mouth 2 (two) times daily. 06/06/2020: Prescribed by Dr. Nehemiah Massed    nitroGLYCERIN (NITROSTAT) 0.4 MG SL tablet Place 1 tablet (0.4 mg total) under the tongue every 5 (five) minutes as needed for chest pain.    nystatin (NYSTATIN) powder Apply 1 application topically daily. (Patient taking differently: Apply 1 application topically daily as needed.)    ondansetron (ZOFRAN-ODT) 8 MG disintegrating tablet Take 1 tablet (8 mg total) by mouth every 8 (eight) hours as needed.    pantoprazole (PROTONIX) 20 MG tablet Take 1 tablet (20 mg total) by mouth 2 (two) times daily.    rosuvastatin (CRESTOR) 40 MG tablet Take 1 tablet (40 mg total) by mouth daily.    sertraline (ZOLOFT) 50 MG tablet Take 1 tablet (50 mg  total) by mouth daily.    TRULICITY 1.5 0000000 SOPN Inject 1.5 mg into the skin once a week.    [DISCONTINUED] cetirizine (ZYRTEC) 5 MG tablet Take 2 tablets (10 mg total) by mouth daily.    No facility-administered encounter medications on file as of 11/14/2020.    Patient Active Problem List   Diagnosis Date Noted   Vertigo    Unsteady gait    Acute bronchitis 03/07/2019   Esophageal dysphagia    Stomach irritation    Gastric polyp    Esophageal lump    Hx of colonic polyp    Polyp of colon    Diverticulosis of large intestine without  diverticulitis    PSVT (paroxysmal supraventricular tachycardia) ([place]) 10/03/2018   Abnormal ECG 07/05/2018   Tachycardia 03/27/2018   Pain in limb 02/28/2018   Chronic diastolic CHF (congestive heart failure) ([place]) 11/03/2017   TIA (transient ischemic attack) 11/03/2017   COPD suggested by initial evaluation ([place]) 08/23/2017   Acute and chronic respiratory failure with hypoxia ([place]) 08/23/2017   Postoperative state 08/15/2017   Incidental pulmonary nodule 08/01/2017   Non-ST elevation myocardial infarction (NSTEMI), subendocardial infarction, subsequent episode of care ([place]) 06/01/2017   B12 deficiency 12/01/2016   Vitamin D deficiency 11/04/2016   Depression with anxiety 09/30/2016   AF (paroxysmal atrial fibrillation) ([place]) 09/30/2016   Resting tremor 09/30/2016   Mild obstructive sleep apnea 09/30/2016   Headache 08/18/2016   Unstable angina ([place]) 08/03/2016   Type 2 diabetes mellitus with complication, without long-term current use of insulin ([place]) 08/03/2016   Chronic daily headache 05/05/2016   Asthma 11/11/2015   S/P CABG x 4 11/10/2015   Coronary artery disease 11/07/2015   Carotid artery narrowing 02/08/2014   Chest pain 02/08/2014   Mixed hyperlipidemia 02/08/2014   Bilateral carotid artery stenosis 02/08/2014   Atypical chest pain 02/07/2014   Essential hypertension 02/07/2014   Awareness of heartbeats 02/07/2014   D (diarrhea) 10/05/2013   Gastroesophageal reflux disease 10/05/2013    Conditions to be addressed/monitored: DMII and Medications  Patient Care Plan: Diabetes Type 2 (Adult)     Problem Identified: Glycemic Management (Diabetes, Type 2)   Priority: High  Onset Date: 04/15/2020     Long-Range Goal: Glycemic Management Optimized   Start Date: 04/15/2020  Expected End Date: 08/13/2020  Recent Progress: On track  Priority: High  Note:    Current Barriers:  Chronic Disease Management support and educational needs r/t Diabetes self  management.   Case Manager Clinical Goal(s):  Over the next 120 days, patient will demonstrate improved adherence to prescribed treatment plan for Diabetes self management as evidenced by daily monitoring and recording of CBG, adherence to ADA/ carb modified diet and adherence to prescribed medication regimen.  Interventions:  Collaboration with Miguel Aschoff, MD regarding development and update of comprehensive plan of care as evidenced by provider attestation and co-signature Inter-disciplinary care team collaboration (see longitudinal plan of care) Reviewed recent blood glucose readings and compliance with current treatment plan. Remains compliant with recommended treatment plan. Continues to take medications as prescribed. She will be out of the area during the week for the next few months. Confirmed follow-up outreach with the [facility] pharmacy technician regarding plan for delivered medications. Plan is to provide a 90 day supply of Trulicity and other delivered prior to her leaving the area. Advised to continue monitoring blood glucose levels and record readings. Reviewed indications for contacting a provider and seeking medical attention. Discussed importance of infection prevention  r/t an ingrown toenail. Reports completing evaluation by the Podiatry today. The ingrown portion was removed. Reports no infection was noted. Advised to continue meticulous foot care. Reviewed s/sx of infection and indications for contacting a provider.   Patient Goals/Self-Care Activities Self administer medications as prescribed Attend all scheduled provider appointments Monitor blood glucose levels consistently and utilize recommended interventions Monitor closely for s/sx of hyperglycemia and seek medical attention if needed Adhere to prescribed ADA/carb modified Notify provider or care management team with questions and new concerns as needed.   Follow Up Plan:  Will follow up within the next  month      PLAN A member of the care management team will follow up in three months.   [name] [facility] Care Management [facility] [phone]

## 2020-11-17 MED ORDER — ROSUVASTATIN CALCIUM 40 MG PO TABS: 40.0000 mg | ORAL_TABLET | Freq: Every day | ORAL | 1 refills | Status: DC

## 2020-11-17 NOTE — Addendum Note (Signed)
Addended by: Daron Offer A on: 11/17/2020 09:41 AM   Modules accepted: Orders

## 2020-11-18 ENCOUNTER — Other Ambulatory Visit: Payer: Self-pay | Admitting: Family Medicine

## 2020-11-18 DIAGNOSIS — E118 Type 2 diabetes mellitus with unspecified complications: Secondary | ICD-10-CM

## 2020-11-18 NOTE — Telephone Encounter (Signed)
  Notes to clinic: 90 day supply    Requested Prescriptions  Pending Prescriptions Disp Refills   TRULICITY [id number] [id number] SOPN [Pharmacy Med Name: Trulicity [id number] mL subcutaneous pen injector] 0.5 mL 4    Sig: INJECT 0.75 MG into [place]     Endocrinology:  Diabetes - GLP-1 Receptor Agonists Failed - 11/18/2020  2:51 PM      Failed - HBA1C is between 0 and 7.9 and within 180 days    Hemoglobin A1C  Date Value Ref Range Status  07/10/2019 9.2  Final   Hgb A1c MFr Bld  Date Value Ref Range Status  08/11/2020 9.1 (H) 4.8 - 5.6 % Final    Comment:             Prediabetes: 5.7 - 6.4          Diabetes: >6.4          Glycemic control for adults with diabetes: <7.0           Passed - Valid encounter within last 6 months    Recent Outpatient Visits           3 months ago Uncontrolled type 2 diabetes mellitus with hyperglycemia [facility])   [facility] [name]., MD   6 months ago Viral URI   [facility] [name]., MD   6 months ago Esophageal dysphagia   [facility] [name]., MD   9 months ago Type 2 diabetes mellitus with complication, without long-term current use of insulin [facility])   [facility] [name]., MD   11 months ago Type 2 diabetes mellitus with complication, without long-term current use of insulin [facility])   [facility] [name]., MD       Future Appointments             In 2 days [name]., MD [facility] [place]

## 2020-11-20 ENCOUNTER — Ambulatory Visit: Payer: Medicaid Other | Admitting: Family Medicine

## 2020-11-20 DIAGNOSIS — I10 Essential (primary) hypertension: Secondary | ICD-10-CM | POA: Diagnosis not present

## 2020-11-20 DIAGNOSIS — I6523 Occlusion and stenosis of bilateral carotid arteries: Secondary | ICD-10-CM | POA: Diagnosis not present

## 2020-11-20 DIAGNOSIS — G459 Transient cerebral ischemic attack, unspecified: Secondary | ICD-10-CM | POA: Diagnosis not present

## 2020-11-20 DIAGNOSIS — I471 Supraventricular tachycardia: Secondary | ICD-10-CM | POA: Diagnosis not present

## 2020-11-20 DIAGNOSIS — I25110 Atherosclerotic heart disease of native coronary artery with unstable angina pectoris: Secondary | ICD-10-CM | POA: Diagnosis not present

## 2020-11-20 DIAGNOSIS — E782 Mixed hyperlipidemia: Secondary | ICD-10-CM | POA: Diagnosis not present

## 2020-11-20 DIAGNOSIS — I214 Non-ST elevation (NSTEMI) myocardial infarction: Secondary | ICD-10-CM | POA: Diagnosis not present

## 2020-11-20 DIAGNOSIS — I480 Paroxysmal atrial fibrillation: Secondary | ICD-10-CM | POA: Diagnosis not present

## 2020-11-20 DIAGNOSIS — I5033 Acute on chronic diastolic (congestive) heart failure: Secondary | ICD-10-CM | POA: Diagnosis not present

## 2020-11-21 ENCOUNTER — Ambulatory Visit: Payer: Self-pay

## 2020-11-21 DIAGNOSIS — I5032 Chronic diastolic (congestive) heart failure: Secondary | ICD-10-CM

## 2020-11-21 DIAGNOSIS — F418 Other specified anxiety disorders: Secondary | ICD-10-CM

## 2020-11-21 DIAGNOSIS — E118 Type 2 diabetes mellitus with unspecified complications: Secondary | ICD-10-CM

## 2020-11-21 NOTE — Chronic Care Management (AMB) (Signed)
Chronic Care Management   CCM RN Visit Note  11/21/2020 Name: Candace [name] MRN: [id number] DOB: 1950-10-25  Subjective: Candace [name] is a 70 y.o. year old female who is a primary care patient of [name]., MD. The care management team was consulted for assistance with disease management and care coordination needs.    Engaged with patient by telephone for follow up visit in response to provider referral for case management and/or care coordination services.   Consent to Services:  The patient was given information about Chronic Care Management services, agreed to services, and gave verbal consent prior to initiation of services.  Please see initial visit note for detailed documentation.    Assessment: Review of patient past medical history, allergies, medications, health status, including review of consultants reports, laboratory and other test data, was performed as part of comprehensive evaluation and provision of chronic care management services.   SDOH (Social Determinants of Health) assessments and interventions performed:  No  CCM Care Plan  Allergies  Allergen Reactions   Lisinopril Cough   Penicillins Swelling, Rash and Other (See Comments)    Did it involve swelling of the face/tongue/throat, SOB, or low BP? Yes Did it involve sudden or severe rash/hives, skin peeling, or any reaction on the inside of your mouth or nose? Yes Did you need to seek medical attention at a hospital or doctor's office? Yes When did it last happen?      15 years If all above answers are "NO", may proceed with cephalosporin use.     Outpatient Encounter Medications as of 11/21/2020  Medication Sig Note   acetaminophen (TYLENOL) 325 MG tablet Take 650 mg by mouth every 6 (six) hours as needed for moderate pain or headache.     bismuth subsalicylate (PEPTO BISMOL) 262 MG chewable tablet Chew 524 mg by mouth as needed.    clopidogrel (PLAVIX) 75 MG tablet Take 1 tablet (75 mg  total) by mouth daily.    Dulaglutide (TRULICITY) A999333 0000000 SOPN INJECT 0.75 MG into THE SKIN ONCE A WEEK    ELIQUIS 5 MG TABS tablet Take 5 mg by mouth 2 (two) times daily. 06/06/2020: Prescribed by Dr. Nehemiah Massed   ezetimibe (ZETIA) 10 MG tablet Take 1 tablet (10 mg total) by mouth daily.    isosorbide mononitrate (IMDUR) 30 MG 24 hr tablet Take 30 mg by mouth daily.    JARDIANCE 25 MG TABS tablet TAKE ONE TABLET BY MOUTH BEFORE BREAKFAST    Lancets (ONETOUCH DELICA PLUS 123XX123) MISC USE UP TO 4 TIMES DAILY AS DIRECTED    loperamide (IMODIUM) 2 MG capsule Take 2 mg by mouth as needed.    losartan (COZAAR) 25 MG tablet TAKE ONE TABLET BY MOUTH EVERYDAY AT BEDTIME    metoprolol tartrate (LOPRESSOR) 100 MG tablet Take 100 mg by mouth 2 (two) times daily. 06/06/2020: Prescribed by Dr. Nehemiah Massed    nitroGLYCERIN (NITROSTAT) 0.4 MG SL tablet Place 1 tablet (0.4 mg total) under the tongue every 5 (five) minutes as needed for chest pain.    nystatin (NYSTATIN) powder Apply 1 application topically daily. (Patient taking differently: Apply 1 application topically daily as needed.)    ondansetron (ZOFRAN-ODT) 8 MG disintegrating tablet Take 1 tablet (8 mg total) by mouth every 8 (eight) hours as needed.    pantoprazole (PROTONIX) 20 MG tablet Take 1 tablet (20 mg total) by mouth 2 (two) times daily.    rosuvastatin (CRESTOR) 40 MG tablet Take 1 tablet (40  mg total) by mouth daily.    sertraline (ZOLOFT) 50 MG tablet Take 1 tablet (50 mg total) by mouth daily.    TRULICITY 1.5 0000000 SOPN Inject 1.5 mg into the skin once a week.    [DISCONTINUED] cetirizine (ZYRTEC) 5 MG tablet Take 2 tablets (10 mg total) by mouth daily.    No facility-administered encounter medications on file as of 11/21/2020.    Patient Active Problem List   Diagnosis Date Noted   Vertigo    Unsteady gait    Acute bronchitis 03/07/2019   Esophageal dysphagia    Stomach irritation    Gastric polyp    Esophageal lump    Hx  of colonic polyp    Polyp of colon    Diverticulosis of large intestine without diverticulitis    PSVT (paroxysmal supraventricular tachycardia) ([place]) 10/03/2018   Abnormal ECG 07/05/2018   Tachycardia 03/27/2018   Pain in limb 02/28/2018   Chronic diastolic CHF (congestive heart failure) ([place]) 11/03/2017   TIA (transient ischemic attack) 11/03/2017   COPD suggested by initial evaluation ([place]) 08/23/2017   Acute and chronic respiratory failure with hypoxia ([place]) 08/23/2017   Postoperative state 08/15/2017   Incidental pulmonary nodule 08/01/2017   Non-ST elevation myocardial infarction (NSTEMI), subendocardial infarction, subsequent episode of care ([place]) 06/01/2017   B12 deficiency 12/01/2016   Vitamin D deficiency 11/04/2016   Depression with anxiety 09/30/2016   AF (paroxysmal atrial fibrillation) ([place]) 09/30/2016   Resting tremor 09/30/2016   Mild obstructive sleep apnea 09/30/2016   Headache 08/18/2016   Unstable angina ([place]) 08/03/2016   Type 2 diabetes mellitus with complication, without long-term current use of insulin ([place]) 08/03/2016   Chronic daily headache 05/05/2016   Asthma 11/11/2015   S/P CABG x 4 11/10/2015   Coronary artery disease 11/07/2015   Carotid artery narrowing 02/08/2014   Chest pain 02/08/2014   Mixed hyperlipidemia 02/08/2014   Bilateral carotid artery stenosis 02/08/2014   Atypical chest pain 02/07/2014   Essential hypertension 02/07/2014   Awareness of heartbeats 02/07/2014   D (diarrhea) 10/05/2013   Gastroesophageal reflux disease 10/05/2013    Conditions to be addressed/monitored: DM and Anxiety Patient Care Plan: Diabetes Type 2 (Adult)     Problem Identified: Glycemic Management (Diabetes, Type 2)   Priority: High  Onset Date: 04/15/2020     Long-Range Goal: Glycemic Management Optimized   Start Date: 11/21/2020  Expected End Date: 03/21/2021  Recent Progress: On track  Priority: High  Note:    Current Barriers:  Chronic Disease  Management support and educational needs r/t Diabetes self management.   Case Manager Clinical Goal(s):  Over the next 120 days, patient will demonstrate improved adherence to prescribed treatment plan for Diabetes self management as evidenced by daily monitoring and recording of CBG, adherence to ADA/ carb modified diet and adherence to prescribed medication regimen.  Interventions:  Collaboration with Miguel Aschoff, MD regarding development and update of comprehensive plan of care as evidenced by provider attestation and co-signature Inter-disciplinary care team collaboration (see longitudinal plan of care) Reviewed recent blood glucose readings and compliance with current treatment plan. Remains compliant with recommended treatment plan. Continues to take medications as prescribed. She will be out of the area during the week for the next few months. Confirmed follow-up outreach with the [facility] pharmacy technician regarding plan for delivered medications. Plan is to provide a 90 day supply of Trulicity and other delivered prior to her leaving the area. Advised to continue monitoring blood glucose levels and  record readings. Reviewed indications for contacting a provider and seeking medical attention. Discussed importance of infection prevention r/t an ingrown toenail. Reports completing evaluation by the Podiatry today. The ingrown portion was removed. Reports no infection was noted. Advised to continue meticulous foot care. Reviewed s/sx of infection and indications for contacting a provider. Update 11/21/2020: Ms. Witherite called to confirm having enough Trulicity and  medications until she returns to the area. Blood glucose readings continue to fluctuate. Also reports increased episodes of anxiety over the past week. Requested to increase dose of sertraline. Currently taking 50 mg daily. PCP approved request to increase dose to 100 mg daily. Advised to continue monitoring levels and symptoms.  Reviewed indications for seeking medical attention if needed prior to the next clinic visit.   Patient Goals/Self-Care Activities Self administer medications as prescribed Attend all scheduled provider appointments Monitor blood glucose levels consistently and utilize recommended interventions Monitor closely for s/sx of hyperglycemia and seek medical attention if needed Adhere to prescribed ADA/carb modified Notify provider or care management team with questions and new concerns as needed.   Follow Up Plan:  Will follow up within the next two months      PLAN A member of the care management team will follow up within the next two months.   [name] [facility] Care Management [facility] [phone]

## 2020-11-21 NOTE — Patient Instructions (Addendum)
Thank you for allowing the Chronic Care Management team to participate in your care.    Patient Care Plan: Diabetes Type 2 (Adult)     Problem Identified: Glycemic Management (Diabetes, Type 2)   Priority: High  Onset Date: 04/15/2020     Long-Range Goal: Glycemic Management Optimized   Start Date: 11/21/2020  Expected End Date: 03/21/2021  Recent Progress: On track  Priority: High  Note:    Current Barriers:  Chronic Disease Management support and educational needs r/t Diabetes self management.   Case Manager Clinical Goal(s):  Over the next 120 days, patient will demonstrate improved adherence to prescribed treatment plan for Diabetes self management as evidenced by daily monitoring and recording of CBG, adherence to ADA/ carb modified diet and adherence to prescribed medication regimen.  Interventions:  Collaboration with Miguel Aschoff, MD regarding development and update of comprehensive plan of care as evidenced by provider attestation and co-signature Inter-disciplinary care team collaboration (see longitudinal plan of care) Reviewed recent blood glucose readings and compliance with current treatment plan. Remains compliant with recommended treatment plan. Continues to take medications as prescribed. She will be out of the area during the week for the next few months. Confirmed follow-up outreach with the [facility] pharmacy technician regarding plan for delivered medications. Plan is to provide a 90 day supply of Trulicity and other delivered prior to her leaving the area. Advised to continue monitoring blood glucose levels and record readings. Reviewed indications for contacting a provider and seeking medical attention. Discussed importance of infection prevention r/t an ingrown toenail. Reports completing evaluation by the Podiatry today. The ingrown portion was removed. Reports no infection was noted. Advised to continue meticulous foot care. Reviewed s/sx of infection and  indications for contacting a provider. Update 11/21/2020: Candace [name] called to confirm having enough Trulicity and  medications until she returns to the area. Blood glucose readings continue to fluctuate. Also reports increased episodes of anxiety over the past week. Requested to increase dose of sertraline. Currently taking 50 mg daily. PCP approved request to increase dose to 100 mg daily. Advised to continue monitoring levels and symptoms. Reviewed indications for seeking medical attention if needed prior to the next clinic visit.   Patient Goals/Self-Care Activities Self administer medications as prescribed Attend all scheduled provider appointments Monitor blood glucose levels consistently and utilize recommended interventions Monitor closely for s/sx of hyperglycemia and seek medical attention if needed Adhere to prescribed ADA/carb modified Notify provider or care management team with questions and new concerns as needed.   Follow Up Plan:  Will follow up within the next two months      Ms. Furse verbalized understanding of the information discussed during the telephonic outreach today. Declined need for mailed/printed instructions. A member of the care management team will follow up within the next two months.   [name] [facility] Care Management [facility] [phone]

## 2020-11-24 ENCOUNTER — Telehealth: Payer: Self-pay

## 2020-11-24 NOTE — Progress Notes (Signed)
Spoke to patient to confirmed patient telephone appointment on 11/25/2020 for CCM at 12:30 pm with Junius Argyle the Clinical pharmacist.   [place] Pharmacist Assistant [phone]

## 2020-11-24 NOTE — Patient Instructions (Signed)
Thank you for allowing the Chronic Care Management team to participate in your care.    Patient Care Plan: Diabetes Type 2 (Adult)     Problem Identified: Glycemic Management (Diabetes, Type 2)   Priority: High  Onset Date: 04/15/2020     Long-Range Goal: Glycemic Management Optimized   Start Date: 04/15/2020  Expected End Date: 08/13/2020  Recent Progress: On track  Priority: High  Note:    Current Barriers:  Chronic Disease Management support and educational needs r/t Diabetes self management.   Case Manager Clinical Goal(s):  Over the next 120 days, patient will demonstrate improved adherence to prescribed treatment plan for Diabetes self management as evidenced by daily monitoring and recording of CBG, adherence to ADA/ carb modified diet and adherence to prescribed medication regimen.  Interventions:  Collaboration with Miguel Aschoff, MD regarding development and update of comprehensive plan of care as evidenced by provider attestation and co-signature Inter-disciplinary care team collaboration (see longitudinal plan of care) Reviewed recent blood glucose readings and compliance with current treatment plan. Reports significant improvements with fasting reading. Reports taking medications as prescribed and monitoring her blood glucose levels frequently as advised. Reviewed s/sx of hypoglycemia and hyperglycemia along with appropriate interventions. She is doing well with monitoring her intake and reading nutritional labels. She has made several improvements to her daily nutritional intake since the last outreach. Discussed need  to maintain and food diary to assist with monitoring intake. Her A1C is not at goal but has improved from 12.2 to 9.1%. Update 11/13/20: Medications/Trulicity. Ms. Hanlan anticipates being in [place] for the next months. She plans to return for medical appointments as scheduled. Previously requested assistance with all ensuring medications would be  available including deliveries from [employer]. Routine prescriptions will be available for pick up at [facility] in [place] as needed. Previously contacted [facility] regarding delivery of Trulicity. Message forwarded to the [facility] staff for additional assistance.    Patient Goals/Self-Care Activities Self administer medications as prescribed Attend all scheduled provider appointments Monitor blood glucose levels consistently and utilize recommended interventions Monitor closely for s/sx of hyperglycemia and seek medical attention if needed Adhere to prescribed ADA/carb modified Notify provider or care management team with questions and new concerns as needed.   Follow Up Plan:  Will follow up within the next month      Pending follow up from the Pharmacy team regarding mailed medications. A member of the care management team will follow up with Ms. Branch within the next month.    [name] [facility] Care Management [facility] [phone]

## 2020-11-25 ENCOUNTER — Ambulatory Visit: Payer: Self-pay

## 2020-11-25 ENCOUNTER — Other Ambulatory Visit: Payer: Self-pay | Admitting: Family Medicine

## 2020-11-25 ENCOUNTER — Ambulatory Visit: Payer: Medicare Other

## 2020-11-25 DIAGNOSIS — E1169 Type 2 diabetes mellitus with other specified complication: Secondary | ICD-10-CM

## 2020-11-25 DIAGNOSIS — E1142 Type 2 diabetes mellitus with diabetic polyneuropathy: Secondary | ICD-10-CM

## 2020-11-25 DIAGNOSIS — E785 Hyperlipidemia, unspecified: Secondary | ICD-10-CM

## 2020-11-25 MED ORDER — NOVOFINE PLUS PEN NEEDLE 32G X 4 MM MISC: 0 refills | Status: DC

## 2020-11-25 MED ORDER — LANTUS SOLOSTAR 100 UNIT/ML ~~LOC~~ SOPN: 10.0000 [IU] | PEN_INJECTOR | Freq: Every day | SUBCUTANEOUS | 0 refills | Status: DC

## 2020-11-25 NOTE — Chronic Care Management (AMB) (Signed)
  Chronic Care Management   CCM RN Visit Note   Name: Candace [name] MRN: [id number] DOB: Jul 28, 1950  Subjective: Candace [name] is a 70 y.o. year old female who is a primary care patient of [name]., MD. The care management team was consulted for assistance with disease management and care coordination needs.    Candace [name] called to update her emergency contacts. She anticipates being out of the area periodically for the next few months. She will be with her granddaughter during this time. Emergency contacts updated to include her granddaughter Candace [name]. Agreed to call if additional assistance is needed.   PLAN A member of the care management team will follow up with Candace [name] within the next two months.    [name] [facility] Care Management [facility] [phone]

## 2020-11-25 NOTE — Patient Instructions (Signed)
Thank you for allowing the Chronic Care Management team to participate in your care.      Patient Care Plan: Diabetes Type 2 (Adult)     Problem Identified: Glycemic Management (Diabetes, Type 2)   Priority: High  Onset Date: 04/15/2020     Long-Range Goal: Glycemic Management Optimized   Start Date: 04/15/2020  Expected End Date: 08/13/2020  Recent Progress: On track  Priority: High  Note:    Current Barriers:  Chronic Disease Management support and educational needs r/t Diabetes self management.   Case Manager Clinical Goal(s):  Over the next 120 days, patient will demonstrate improved adherence to prescribed treatment plan for Diabetes self management as evidenced by daily monitoring and recording of CBG, adherence to ADA/ carb modified diet and adherence to prescribed medication regimen.  Interventions:  Collaboration with Candace Aschoff, MD regarding development and update of comprehensive plan of care as evidenced by provider attestation and co-signature Inter-disciplinary care team collaboration (see longitudinal plan of care) Reviewed recent blood glucose readings and compliance with current treatment plan. Remains compliant with recommended treatment plan. Continues to take medications as prescribed. She will be out of the area during the week for the next few months. Confirmed follow-up outreach with the [facility] pharmacy technician regarding plan for delivered medications. Plan is to provide a 90 day supply of Trulicity and other delivered prior to her leaving the area. Advised to continue monitoring blood glucose levels and record readings. Reviewed indications for contacting a provider and seeking medical attention. Discussed importance of infection prevention r/t an ingrown toenail. Reports completing evaluation by the Podiatry today. The ingrown portion was removed. Reports no infection was noted. Advised to continue meticulous foot care. Reviewed s/sx of infection and  indications for contacting a provider.   Patient Goals/Self-Care Activities Self administer medications as prescribed Attend all scheduled provider appointments Monitor blood glucose levels consistently and utilize recommended interventions Monitor closely for s/sx of hyperglycemia and seek medical attention if needed Adhere to prescribed ADA/carb modified Notify provider or care management team with questions and new concerns as needed.   Follow Up Plan:  Will follow up within the next month      Candace [name] verbalized understanding of the information discussed during the telephonic outreach. Declined need for mailed/printed instructions. A member of the care management team will follow up within the next three months.   Candace [name] Health[facility] Care Management [facility] [phone]

## 2020-11-25 NOTE — Patient Instructions (Signed)
Visit Information It was great speaking with you today!  Please let me know if you have any questions about our visit.   Goals Addressed             This Visit's Progress    Monitor and Manage My Blood Sugar-Diabetes Type 2   On track    Timeframe:  Long-Range Goal Priority:  High Start Date: 04/15/20      Expected End Date: 02/26/22                   Follow Up Date 11/28/20   - Check blood sugar daily before breakfast - Check blood sugar if I feel it is too high or too low - Enter blood sugar readings and medication or insulin into daily log - Take the blood sugar log to all doctor visits - Take the blood sugar meter to all doctor visits          Patient Care Plan: General Pharmacy (Adult)     Problem Identified: Hypertension, Hyperlipidemia, Diabetes, Atrial Fibrillation, Heart Failure, Coronary Artery Disease, GERD, Asthma, Depression and Anxiety   Priority: High     Long-Range Goal: Patient-Specific Goal   Start Date: 06/06/2020  Expected End Date: 11/25/2021  This Visit's Progress: On track  Recent Progress: On track  Priority: High  Note:   Current Barriers:  Unable to achieve control of diabetes  Does not adhere to prescribed medication regimen  Pharmacist Clinical Goal(s):  Over the next 90 days, patient will achieve control of diabetes as evidenced by A1c less than 8% adhere to prescribed medication regimen as evidenced by utilization of enhanced pharmacy services through collaboration with PharmD and provider.   Interventions: 1:1 collaboration with [name]., MD regarding development and update of comprehensive plan of care as evidenced by provider attestation and co-signature Inter-disciplinary care team collaboration (see longitudinal plan of care) Comprehensive medication review performed; medication list updated in electronic medical record  Diabetes (A1c goal <7%) -Uncontrolled -Diagnosed 2010 -Current medications: Trulicity 1.5 mg  weekly on Tuesdays   Jardiance 25 mg daily  -Medications previously tried: Glipizide, Lantus, Tradjenta, Metformin, Metformin XR, Ozempic   -Current home glucose readings:    Fasting  30-Aug 273  29-Aug 187  28-Aug 216  27-Aug 171  26-Aug 187  25-Aug 197  24-Aug 203  23-Aug 217  22-Aug 203  21-Aug 245  Average 210    -Denies hypoglycemic/hyperglycemic symptoms -Current meal patterns: trying to eat more salads, water. Limiting carbohydrates when possible. 24 hour recall:  breakfast: Yogurt + Banana  lunch: Broccoli + Grapes (handful)  dinner: Mac&Cheese  + Broccoli + BBQ Ribs  snacks: None drinks: Glucerna shakes (1x daily), water, regular ginger ale  -Current exercise: walking daily (15 minute sessions)  -START Lantus 10 units nightly. Patient given extensive counseling on insulin administration, storage, and handling. She was also instructed to ask for additional assistance when picking up the insulin from the pharmacy. Patient able to verbalize back the plan today to me.   Hyperlipidemia: (LDL goal < 70) -History of CABG x4 2017, 2018  -Uncontrolled -Current treatment: Rosuvastatin 40 mg daily Ezetimibe 10 mg daily   -Current antiplatelet treatment: Clopidogrel 75 mg daily  -Medications previously tried: Atorvastatin (changed to rosuvastatin during hospitalization) -Patient reports tolerating new cholesterol regimen well, denies myalgias  -Cholesterol improved, but still uncontrolled. Could consider switching Zetia to Repatha for improved benefit. Will defer until next visit.  -Recommended to continue current medication  Atrial  Fibrillation (Goal: prevent stroke and major bleeding) -Controlled -CHADSVASC: 6 -Current treatment: Rate control: Metoprolol tartrate 100 mg twice daily  Anticoagulation: Eliquis 5 mg twice daily  -Medications previously tried: NA -Home BP and HR readings: NA  -Counseled on importance of adherence to anticoagulant exactly as  prescribed; avoidance of NSAIDs due to increased bleeding risk with anticoagulants; Careful monitoring due to increased bleeding risk with clopidogrel + Eliquis -Recommended to continue current medication  Heart Failure (Goal: manage symptoms and prevent exacerbations) -Controlled -Last ejection fraction: 55-60% (Date: June 2021) -HF type: Diastolic -NYHA Class: III (marked limitation of activity) -AHA HF Stage: C (Heart disease and symptoms present) -Current treatment: Imdur 30 mg daily  Losartan 25 mg daily Metoprolol tartrate 100 mg twice daily  -Medications previously tried: NA  -Current home BP/HR readings: NA -Educated on Importance of weighing daily; if you gain more than 3 pounds in one day or 5 pounds in one week, contact provider's office -Recommended to continue current medication  Depression/Anxiety (Goal: maintain stable mood) -Controlled -Current treatment: Sertraline 50 mg daily  -Medications previously tried/failed: NA -PHQ9: 0 -GAD7: 1 - Patient reports she manages her stress by talking with daughter, reading, or medidating  -Educated on Benefits of medication for symptom control -Recommended to continue current medication  GERD with dysphagia (Goal: prevent heartburn or reflux symptoms) -Controlled -Current treatment  Pantoprazole 40 mg daily   -Medications previously tried: Sucralfate,  -Continue current medications  Patient Goals/Self-Care Activities Over the next 90 days, patient will:  - check glucose daily, before breakfast, document, and provide at future appointments check blood pressure weekly, document, and provide at future appointments  Follow Up Plan: Telephone follow up appointment with care management team member scheduled for:  12/12/2020 at 10:00 AM     Patient agreed to services and verbal consent obtained.   The patient verbalized understanding of instructions, educational materials, and care plan provided today and declined offer to  receive copy of patient instructions, educational materials, and care plan.   Junius Argyle, PharmD, [facility], [place] [phone]

## 2020-11-25 NOTE — Progress Notes (Signed)
Chronic Care Management Pharmacy Note  11/25/2020 Name:  Candace [name] MRN:  [id number] DOB:  05-01-1950  Summary: Patient blood sugars still significantly elevated despite lifestyle changes and medication therapy. Fasting blood sugars worsened, averaging 210.   Recommendations/Changes made from today's visit: START Lantus 10 units nightly   Plan: Health Concierge follow-up in one week to assess Lantus tolerability.  CPP Follow-up in 3 weeks   Subjective: Candace [name] is an 70 y.o. year old female who is a primary patient of [name]., MD.  The CCM team was consulted for assistance with disease management and care coordination needs.    Engaged with patient by telephone for follow up visit in response to provider referral for pharmacy case management and/or care coordination services.   Consent to Services:  The patient was given information about Chronic Care Management services, agreed to services, and gave verbal consent prior to initiation of services.  Please see initial visit note for detailed documentation.   Patient Care Team: [name]., MD as PCP - General (Family Medicine) Corey Skains, MD as Consulting Physician (Cardiology) Sharlotte Alamo, DPM (Podiatry) Neldon Labella, RN as Case Manager Virgel Manifold, MD as Consulting Physician (Gastroenterology) Pa, [place], Glean Salvo, [facility] as Pharmacist (Pharmacist)  Recent office visits: 08/11/20: Patient presented to Dr. Rosanna Randy for follow-up. A1c improved to 9.1%. Patient started on losartan 25 mg daily.  05/13/20: Patient presented to Dr. Rosanna Randy for viral URI. Patient started on benzonatate.  05/01/20: Patient presented to Dr. Rosanna Randy for follow-up. A1c 12.2%. Patient started on Trulicity 6.56 mg weekly. Carafate  04/02/20: Patient presented to [facility], LPN for AWV.   Recent consult visits: 08/07/20: Patient presented to Dr. Nehemiah Massed (Cardiology) for  follow-up. No medication changes noted.  07/07/20: Patient presented to Dr. Nehemiah Massed (Cardiology) for follow-up. No medication changes noted. 02/27/21: Patient presented to Hassell Done, NP. Patient started on Eliquis 5 mg twice daily for A-Fib. Non-adherence to atorvastatin   Hospital visits: 07/09/20: Patient hospitalized for chest pain. Aspirin stopped. Clopidogrel 7.109m daily, Ezetimibe 10 mg daily started. 3/22-3/23: Patient hospitalized for chest pain. Demand ischemia, possible pericarditis. Patient started on aspirin 81 mg daily, colchicine 0.6 mg twice daily   Objective:  Lab Results  Component Value Date   CREATININE 1.24 (H) 10/20/2020   BUN 9 10/20/2020   GFRNONAA >60 10/04/2020   GFRAA 108 02/06/2020   NA 139 10/20/2020   K 4.0 10/20/2020   CALCIUM 9.8 10/20/2020   CO2 22 10/20/2020    Lab Results  Component Value Date/Time   HGBA1C 9.1 (H) 08/11/2020 09:32 AM   HGBA1C 12.2 (A) 05/01/2020 09:55 AM   HGBA1C 12.4 (A) 12/06/2019 11:35 AM   HGBA1C 9.8 (H) 08/30/2019 07:15 AM   HGBA1C 9.2 07/10/2019 12:00 AM   HGBA1C 8.3 (H) 07/14/2013 05:42 AM   MICROALBUR 50 08/11/2020 08:51 AM    Last diabetic Eye exam:  Lab Results  Component Value Date/Time   HMDIABEYEEXA No Retinopathy 06/28/2019 12:00 AM    Last diabetic Foot exam: No results found for: HMDIABFOOTEX   Lab Results  Component Value Date   CHOL 164 08/11/2020   HDL 57 08/11/2020   LDLCALC 90 08/11/2020   TRIG 94 08/11/2020   CHOLHDL 2.9 08/11/2020    Hepatic Function Latest Ref Rng & Units 10/20/2020 10/04/2020 08/11/2020  Total Protein 6.0 - 8.5 g/dL 6.3 6.9 6.4  Albumin 3.8 - 4.8 g/dL 4.3 3.8 4.4  AST  0 - 40 IU/L _0 ALT 0 - 32 IU/L _1 Alk Phosphatase 44 - 121 IU/L 122(H) 107 117  Total Bilirubin 0.0 - 1.2 mg/dL 0.2 0.5 0.4  Bilirubin, Direct 0.0 - 0.2 mg/dL - <0.1 -    Lab Results  Component Value Date/Time   TSH 1.180 01/03/2019 09:57 AM   TSH 1.262 09/14/2018 05:04 PM   TSH 1.661  12/23/2016 07:03 PM   TSH 0.818 10/01/2016 09:35 AM    CBC Latest Ref Rng & Units 10/20/2020 10/04/2020 09/24/2020  WBC 3.4 - 10.8 x10E3/uL 7.0 7.7 6.4  Hemoglobin 11.1 - 15.9 g/dL 13.2 13.9 13.7  Hematocrit 34.0 - 46.6 % 38.6 42.0 40.0  Platelets 150 - 450 x10E3/uL 191 176 197    Lab Results  Component Value Date/Time   VD25OH 12.2 (L) 10/01/2016 09:35 AM    Clinical ASCVD: Yes  The ASCVD Risk score Mikey Bussing DC Jr., et al., 2013) failed to calculate for the following reasons:   The patient has a prior MI or stroke diagnosis    Depression screen [facility] 2/9 10/29/2020 08/05/2020 04/15/2020  Decreased Interest 0 1 0  Down, Depressed, Hopeless 0 1 0  PHQ - 2 Score 0 2 0  Altered sleeping 1 1 -  Tired, decreased energy 1 1 -  Change in appetite 0 1 -  Feeling bad or failure about yourself  0 0 -  Trouble concentrating 0 0 -  Moving slowly or fidgety/restless 1 0 -  Suicidal thoughts 0 0 -  PHQ-9 Score 3 5 -  Difficult doing work/chores Not difficult at all Not difficult at all -  Some recent data might be hidden    Social History   Tobacco Use  Smoking Status Former   Packs/day: 0.50   Types: Cigarettes   Quit date: 10/07/2001   Years since quitting: 19.1  Smokeless Tobacco Never   BP Readings from Last 3 Encounters:  10/05/20 135/80  09/24/20 (!) 165/89  08/11/20 108/76   Pulse Readings from Last 3 Encounters:  10/05/20 80  09/24/20 78  08/11/20 83   Wt Readings from Last 3 Encounters:  10/04/20 167 lb (75.8 kg)  09/24/20 167 lb (75.8 kg)  08/11/20 169 lb (76.7 kg)    Assessment/Interventions: Review of patient past medical history, allergies, medications, health status, including review of consultants reports, laboratory and other test data, was performed as part of comprehensive evaluation and provision of chronic care management services.   SDOH:  (Social Determinants of Health) assessments and interventions performed: Yes SDOH Interventions    Flowsheet Row Most  Recent Value  SDOH Interventions   Financial Strain Interventions Intervention Not Indicated         CCM Care Plan  Allergies  Allergen Reactions   Lisinopril Cough   Penicillins Swelling, Rash and Other (See Comments)    Did it involve swelling of the face/tongue/throat, SOB, or low BP? Yes Did it involve sudden or severe rash/hives, skin peeling, or any reaction on the inside of your mouth or nose? Yes Did you need to seek medical attention at a hospital or doctor's office? Yes When did it last happen?      15 years If all above answers are "NO", may proceed with cephalosporin use.     Medications Reviewed Today     Reviewed by Neldon Labella, RN (Registered Nurse) on 11/21/20 at 1425  Med List Status: <None>   Medication Order Taking? Sig Documenting Provider Last  Dose Status Informant  acetaminophen (TYLENOL) 325 MG tablet [id number] No Take 650 mg by mouth every 6 (six) hours as needed for moderate pain or headache.  [provider] Taking Active Self  bismuth subsalicylate (PEPTO BISMOL) 262 MG chewable tablet [id number] No Chew 524 mg by mouth as needed. [provider] Taking Active Self  Discontinued 03/06/19 1830   clopidogrel (PLAVIX) 75 MG tablet [id number]  Take 1 tablet (75 mg total) by mouth daily. [name]., MD  Active   Dulaglutide (TRULICITY) 3.42 AJ/6.8TL [facility] [id number]  INJECT 0.75 MG into THE SKIN ONCE A WEEK [name]., MD  Active   ELIQUIS 5 MG TABS tablet [id number] No Take 5 mg by mouth 2 (two) times daily. [provider] Taking Active Self           Med Note Michaelle Birks, Glean Salvo   Fri Jun 06, 2020 11:56 AM) Prescribed by Dr. Nehemiah Massed  ezetimibe (ZETIA) 10 MG tablet [id number]  Take 1 tablet (10 mg total) by mouth daily. [name]., MD  Active   isosorbide mononitrate (IMDUR) 30 MG 24 hr tablet [id number] No Take 30 mg by mouth daily. [provider] Taking Active   JARDIANCE 25 MG TABS  tablet [id number] No TAKE ONE TABLET BY MOUTH BEFORE BREAKFAST [name]., MD Taking Active   Lancets (ONETOUCH DELICA PLUS [id number]) [place] [id number] No USE UP TO 4 TIMES DAILY AS DIRECTED [name]., MD Taking Active Self  loperamide (IMODIUM) 2 MG capsule [id number] No Take 2 mg by mouth as needed. [provider] Taking Active Self  losartan (COZAAR) 25 MG tablet [id number] No TAKE ONE TABLET BY MOUTH EVERYDAY AT BEDTIME [name]., MD Taking Active   metoprolol tartrate (LOPRESSOR) 100 MG tablet [id number] No Take 100 mg by mouth 2 (two) times daily. [provider] Taking Active Self           Med Note Michaelle Birks, Glean Salvo   Fri Jun 06, 2020 11:56 AM) Prescribed by Dr. Nehemiah Massed   nitroGLYCERIN (NITROSTAT) 0.4 MG SL tablet [id number] No Place 1 tablet (0.4 mg total) under the tongue every 5 (five) minutes as needed for chest pain. Bettey Costa, MD Taking Active Self           Med Note Johny Drilling Mar 27, 2018  2:39 PM)    nystatin (NYSTATIN) powder [id number] No Apply 1 application topically daily.  Patient taking differently: Apply 1 application topically daily as needed.   Rubie Maid, MD Taking Active   ondansetron (ZOFRAN-ODT) 8 MG disintegrating tablet [id number] No Take 1 tablet (8 mg total) by mouth every 8 (eight) hours as needed. [name]., MD Taking Active   pantoprazole (PROTONIX) 20 MG tablet [id number] No Take 1 tablet (20 mg total) by mouth 2 (two) times daily. [name]., MD Taking Active Self  rosuvastatin (CRESTOR) 40 MG tablet [id number]  Take 1 tablet (40 mg total) by mouth daily. [name]., MD  Active   sertraline (ZOLOFT) 50 MG tablet [id number] No Take 1 tablet (50 mg total) by mouth daily. [name]., MD Taking Active   TRULICITY 1.5 WK/4.6KM [facility] [id number] No Inject 1.5 mg into the skin once a week. [name]., MD Taking Active             Patient  Active Problem List   Diagnosis Date Noted  Vertigo    Unsteady gait    Acute bronchitis 03/07/2019   Esophageal dysphagia    Stomach irritation    Gastric polyp    Esophageal lump    Hx of colonic polyp    Polyp of colon    Diverticulosis of large intestine without diverticulitis    PSVT (paroxysmal supraventricular tachycardia) (HCC) 10/03/2018   Abnormal ECG 07/05/2018   Tachycardia 03/27/2018   Pain in limb 02/28/2018   Chronic diastolic CHF (congestive heart failure) ([place]) 11/03/2017   TIA (transient ischemic attack) 11/03/2017   COPD suggested by initial evaluation ([place]) 08/23/2017   Acute and chronic respiratory failure with hypoxia ([place]) 08/23/2017   Postoperative state 08/15/2017   Incidental pulmonary nodule 08/01/2017   Non-ST elevation myocardial infarction (NSTEMI), subendocardial infarction, subsequent episode of care ([place]) 06/01/2017   B12 deficiency 12/01/2016   Vitamin D deficiency 11/04/2016   Depression with anxiety 09/30/2016   AF (paroxysmal atrial fibrillation) ([place]) 09/30/2016   Resting tremor 09/30/2016   Mild obstructive sleep apnea 09/30/2016   Headache 08/18/2016   Unstable angina ([place]) 08/03/2016   Type 2 diabetes mellitus with complication, without long-term current use of insulin ([place]) 08/03/2016   Chronic daily headache 05/05/2016   Asthma 11/11/2015   S/P CABG x 4 11/10/2015   Coronary artery disease 11/07/2015   Carotid artery narrowing 02/08/2014   Chest pain 02/08/2014   Mixed hyperlipidemia 02/08/2014   Bilateral carotid artery stenosis 02/08/2014   Atypical chest pain 02/07/2014   Essential hypertension 02/07/2014   Awareness of heartbeats 02/07/2014   D (diarrhea) 10/05/2013   Gastroesophageal reflux disease 10/05/2013    Immunization History  Administered Date(s) Administered   Fluad Quad(high Dose 65+) 12/28/2018   Influenza, High Dose Seasonal PF 12/24/2016, 12/08/2017   Influenza,inj,Quad PF,6+ Mos 12/19/2015, 01/18/2020    Influenza-Unspecified 03/06/2015   Moderna Sars-Covid-2 Vaccination 04/14/2019, 05/12/2019, 01/28/2020   Pneumococcal Conjugate-13 03/16/2018   Pneumococcal Polysaccharide-23 12/24/2016   Tdap 07/28/2010    Conditions to be addressed/monitored:  Hypertension, Hyperlipidemia, Diabetes, Atrial Fibrillation, Heart Failure, Coronary Artery Disease, GERD, Asthma, Depression and Anxiety  Care Plan : General Pharmacy (Adult)  Updates made by Germaine Pomfret, RPH since 11/25/2020 12:00 AM     Problem: Hypertension, Hyperlipidemia, Diabetes, Atrial Fibrillation, Heart Failure, Coronary Artery Disease, GERD, Asthma, Depression and Anxiety   Priority: High     Long-Range Goal: Patient-Specific Goal   Start Date: 06/06/2020  Expected End Date: 11/25/2021  This Visit's Progress: On track  Recent Progress: On track  Priority: High  Note:   Current Barriers:  Unable to achieve control of diabetes  Does not adhere to prescribed medication regimen  Pharmacist Clinical Goal(s):  Over the next 90 days, patient will achieve control of diabetes as evidenced by A1c less than 8% adhere to prescribed medication regimen as evidenced by utilization of enhanced pharmacy services through collaboration with PharmD and provider.   Interventions: 1:1 collaboration with [name]., MD regarding development and update of comprehensive plan of care as evidenced by provider attestation and co-signature Inter-disciplinary care team collaboration (see longitudinal plan of care) Comprehensive medication review performed; medication list updated in electronic medical record  Diabetes (A1c goal <7%) -Uncontrolled -Diagnosed 2010 -Current medications: Trulicity 1.5 mg weekly on Tuesdays   Jardiance 25 mg daily  -Medications previously tried: Glipizide, Lantus, Tradjenta, Metformin, Metformin XR, Ozempic   -Current home glucose readings:    Fasting  30-Aug 273  29-Aug 187  28-Aug 216  27-Aug  171  26-Aug 187  25-Aug 197  24-Aug 203  23-Aug 217  22-Aug 203  21-Aug 245  Average 210    -Denies hypoglycemic/hyperglycemic symptoms -Current meal patterns: trying to eat more salads, water. Limiting carbohydrates when possible. 24 hour recall:  breakfast: Yogurt + Banana  lunch: Broccoli + Grapes (handful)  dinner: Mac&Cheese  + Broccoli + BBQ Ribs  snacks: None drinks: Glucerna shakes (1x daily), water, regular ginger ale  -Current exercise: walking daily (15 minute sessions)  -START Lantus 10 units nightly. Patient given extensive counseling on insulin administration, storage, and handling. She was also instructed to ask for additional assistance when picking up the insulin from the pharmacy. Patient able to verbalize back the plan today to me.   Hyperlipidemia: (LDL goal < 70) -History of CABG x4 2017, 2018  -Uncontrolled -Current treatment: Rosuvastatin 40 mg daily Ezetimibe 10 mg daily   -Current antiplatelet treatment: Clopidogrel 75 mg daily  -Medications previously tried: Atorvastatin (changed to rosuvastatin during hospitalization) -Patient reports tolerating new cholesterol regimen well, denies myalgias  -Cholesterol improved, but still uncontrolled. Could consider switching Zetia to Repatha for improved benefit. Will defer until next visit.  -Recommended to continue current medication  Atrial Fibrillation (Goal: prevent stroke and major bleeding) -Controlled -CHADSVASC: 6 -Current treatment: Rate control: Metoprolol tartrate 100 mg twice daily  Anticoagulation: Eliquis 5 mg twice daily  -Medications previously tried: NA -Home BP and HR readings: NA  -Counseled on importance of adherence to anticoagulant exactly as prescribed; avoidance of NSAIDs due to increased bleeding risk with anticoagulants; Careful monitoring due to increased bleeding risk with clopidogrel + Eliquis -Recommended to continue current medication  Heart Failure (Goal: manage symptoms and  prevent exacerbations) -Controlled -Last ejection fraction: 55-60% (Date: June 2021) -HF type: Diastolic -NYHA Class: III (marked limitation of activity) -AHA HF Stage: C (Heart disease and symptoms present) -Current treatment: Imdur 30 mg daily  Losartan 25 mg daily Metoprolol tartrate 100 mg twice daily  -Medications previously tried: NA  -Current home BP/HR readings: NA -Educated on Importance of weighing daily; if you gain more than 3 pounds in one day or 5 pounds in one week, contact provider's office -Recommended to continue current medication  Depression/Anxiety (Goal: maintain stable mood) -Controlled -Current treatment: Sertraline 50 mg daily  -Medications previously tried/failed: NA -PHQ9: 0 -GAD7: 1 - Patient reports she manages her stress by talking with daughter, reading, or medidating  -Educated on Benefits of medication for symptom control -Recommended to continue current medication  GERD with dysphagia (Goal: prevent heartburn or reflux symptoms) -Controlled -Current treatment  Pantoprazole 40 mg daily   -Medications previously tried: Sucralfate,  -Continue current medications  Patient Goals/Self-Care Activities Over the next 90 days, patient will:  - check glucose daily, before breakfast, document, and provide at future appointments check blood pressure weekly, document, and provide at future appointments  Follow Up Plan: Telephone follow up appointment with care management team member scheduled for:  12/12/2020 at 10:00 AM     Medication Assistance: None required.  Patient affirms current coverage meets needs.  Patient's preferred pharmacy is:  [facility] - [place], [place] - [address] [place] [address] Phone: [phone] Fax: [phone]  We discussed: Benefits of medication synchronization, packaging and delivery as well as enhanced pharmacist oversight with [facility]. Patient  decided to: Utilize [facility] pharmacy for medication synchronization, packaging and delivery   Care Plan and Follow Up Patient Decision:  Patient agrees to Care Plan  and Follow-up.  Plan: Telephone follow up appointment with care management team member scheduled for:  12/12/2020 at 10:00 AM  [place] [phone]

## 2020-11-26 ENCOUNTER — Other Ambulatory Visit: Payer: Self-pay | Admitting: Family Medicine

## 2020-11-26 DIAGNOSIS — E1169 Type 2 diabetes mellitus with other specified complication: Secondary | ICD-10-CM

## 2020-11-26 DIAGNOSIS — E785 Hyperlipidemia, unspecified: Secondary | ICD-10-CM

## 2020-11-26 DIAGNOSIS — E1142 Type 2 diabetes mellitus with diabetic polyneuropathy: Secondary | ICD-10-CM

## 2020-11-26 DIAGNOSIS — E118 Type 2 diabetes mellitus with unspecified complications: Secondary | ICD-10-CM | POA: Diagnosis not present

## 2020-11-26 DIAGNOSIS — I5032 Chronic diastolic (congestive) heart failure: Secondary | ICD-10-CM

## 2020-11-26 DIAGNOSIS — Z1231 Encounter for screening mammogram for malignant neoplasm of breast: Secondary | ICD-10-CM

## 2020-11-26 DIAGNOSIS — F418 Other specified anxiety disorders: Secondary | ICD-10-CM

## 2020-11-26 NOTE — Telephone Encounter (Signed)
Requested medication (s) are due for refill today: Yes  Requested medication (s) are on the active medication list: Yes  Last refill:  11/25/20  Future visit scheduled: Yes  Notes to clinic:  See highlighted notes from pharmacy, requesting alternative per insurance.     Requested Prescriptions  Pending Prescriptions Disp Refills   LANTUS SOLOSTAR 100 UNIT/ML Solostar Pen [Pharmacy Med Name: LANTUS SOLOSTAR 100 UNIT/ML]  0     Endocrinology:  Diabetes - Insulins Failed - 11/25/2020  5:00 PM      Failed - HBA1C is between 0 and 7.9 and within 180 days    Hemoglobin A1C  Date Value Ref Range Status  07/10/2019 9.2  Final   Hgb A1c MFr Bld  Date Value Ref Range Status  08/11/2020 9.1 (H) 4.8 - 5.6 % Final    Comment:             Prediabetes: 5.7 - 6.4          Diabetes: >6.4          Glycemic control for adults with diabetes: <7.0           Passed - Valid encounter within last 6 months    Recent Outpatient Visits           3 months ago Uncontrolled type 2 diabetes mellitus with hyperglycemia [facility])   [facility] [name]., MD   6 months ago Viral URI   [facility] [name]., MD   6 months ago Esophageal dysphagia   [facility] [name]., MD   9 months ago Type 2 diabetes mellitus with complication, without long-term current use of insulin [facility])   [facility] [name]., MD   11 months ago Type 2 diabetes mellitus with complication, without long-term current use of insulin [facility])   [facility] [name]., MD       Future Appointments             In 4 weeks [name]., MD [facility]

## 2020-12-02 ENCOUNTER — Ambulatory Visit: Payer: Self-pay

## 2020-12-02 ENCOUNTER — Ambulatory Visit (INDEPENDENT_AMBULATORY_CARE_PROVIDER_SITE_OTHER): Payer: Medicare Other

## 2020-12-02 ENCOUNTER — Other Ambulatory Visit: Payer: Self-pay

## 2020-12-02 ENCOUNTER — Ambulatory Visit (INDEPENDENT_AMBULATORY_CARE_PROVIDER_SITE_OTHER): Payer: Medicare Other | Admitting: Family Medicine

## 2020-12-02 ENCOUNTER — Encounter: Payer: Self-pay | Admitting: Family Medicine

## 2020-12-02 VITALS — BP 96/62 | HR 90 | Temp 98.8°F | Resp 18

## 2020-12-02 DIAGNOSIS — I152 Hypertension secondary to endocrine disorders: Secondary | ICD-10-CM

## 2020-12-02 DIAGNOSIS — Z951 Presence of aortocoronary bypass graft: Secondary | ICD-10-CM | POA: Diagnosis not present

## 2020-12-02 DIAGNOSIS — R079 Chest pain, unspecified: Secondary | ICD-10-CM

## 2020-12-02 DIAGNOSIS — F418 Other specified anxiety disorders: Secondary | ICD-10-CM

## 2020-12-02 DIAGNOSIS — I25110 Atherosclerotic heart disease of native coronary artery with unstable angina pectoris: Secondary | ICD-10-CM | POA: Diagnosis not present

## 2020-12-02 DIAGNOSIS — I5032 Chronic diastolic (congestive) heart failure: Secondary | ICD-10-CM

## 2020-12-02 DIAGNOSIS — E1159 Type 2 diabetes mellitus with other circulatory complications: Secondary | ICD-10-CM

## 2020-12-02 DIAGNOSIS — I200 Unstable angina: Secondary | ICD-10-CM

## 2020-12-02 DIAGNOSIS — E1142 Type 2 diabetes mellitus with diabetic polyneuropathy: Secondary | ICD-10-CM

## 2020-12-02 MED ORDER — ISOSORBIDE MONONITRATE ER 120 MG PO TB24: 120.0000 mg | ORAL_TABLET | Freq: Every day | ORAL | 1 refills | Status: DC

## 2020-12-02 NOTE — Progress Notes (Signed)
I,[name],acting as a scribe for [name], MD.,have documented all relevant documentation on the behalf of [name], MD,as directed by  [name], MD while in the presence of [name], MD.  Established patient visit   Patient: Candace [name]   DOB: [birth year]   70 y.o. Female  MRN: [id number] Visit Date: 12/02/2020  Today's healthcare provider: [name], MD   Chief Complaint  Patient presents with   Chest Pain   Subjective    Chest Pain  This is a new problem. The current episode started in the past 7 days (1 week). The onset quality is gradual. The problem occurs intermittently. The pain is present in the substernal region. The pain is at a severity of 7/10. The pain is moderate. The quality of the pain is described as pressure, stabbing and tightness. The pain radiates to the left shoulder. Associated symptoms include abdominal pain, back pain, diaphoresis, dizziness, exertional chest pressure, headaches, irregular heartbeat, malaise/fatigue, near-syncope, palpitations and shortness of breath. Pertinent negatives include no claudication, cough, fever, hemoptysis, leg pain, lower extremity edema, nausea, numbness, orthopnea, PND, sputum production, syncope, vomiting or weakness. The pain is aggravated by exertion, movement and walking. She has tried nitroglycerin for the symptoms. The treatment provided mild relief.  Her past medical history is significant for CAD and CHF.  Her family medical history is significant for CAD. Prior diagnostic workup includes cardiac catheterization.   Patient has ongoing chronic chest pain.  Also hurts into her left shoulder. She states she at times gets diaphoretic and weak.  It is difficult for her to tell if it is worse with exertion or if it just hurts all the time.      Medications: Outpatient Medications Prior to Visit  Medication Sig   acetaminophen (TYLENOL) 325 MG tablet Take 650 mg  by mouth every 6 (six) hours as needed for moderate pain or headache.    bismuth subsalicylate (PEPTO BISMOL) 262 MG chewable tablet Chew 524 mg by mouth as needed.   clopidogrel (PLAVIX) 75 MG tablet Take 1 tablet (75 mg total) by mouth daily.   ELIQUIS 5 MG TABS tablet Take 5 mg by mouth 2 (two) times daily.   ezetimibe (ZETIA) 10 MG tablet Take 1 tablet (10 mg total) by mouth daily.   insulin glargine (LANTUS SOLOSTAR) 100 UNIT/ML Solostar Pen Inject 10 Units into the skin at bedtime.   Insulin Pen Needle (NOVOFINE PLUS PEN NEEDLE) 32G X 4 MM MISC Use to inject insulin into the skin once daily.   JARDIANCE 25 MG TABS tablet TAKE ONE TABLET BY MOUTH BEFORE BREAKFAST   Lancets (ONETOUCH DELICA PLUS 123XX123) MISC USE UP TO 4 TIMES DAILY AS DIRECTED   loperamide (IMODIUM) 2 MG capsule Take 2 mg by mouth as needed.   losartan (COZAAR) 25 MG tablet TAKE ONE TABLET BY MOUTH EVERYDAY AT BEDTIME   metoprolol tartrate (LOPRESSOR) 100 MG tablet Take 100 mg by mouth 2 (two) times daily.   nitroGLYCERIN (NITROSTAT) 0.4 MG SL tablet Place 1 tablet (0.4 mg total) under the tongue every 5 (five) minutes as needed for chest pain.   nystatin (NYSTATIN) powder Apply 1 application topically daily. (Patient taking differently: Apply 1 application topically daily as needed.)   ondansetron (ZOFRAN-ODT) 8 MG disintegrating tablet Take 1 tablet (8 mg total) by mouth every 8 (eight) hours as needed.   pantoprazole (PROTONIX) 20 MG tablet Take 1 tablet (20 mg total)  by mouth 2 (two) times daily.   rosuvastatin (CRESTOR) 40 MG tablet Take 1 tablet (40 mg total) by mouth daily.   sertraline (ZOLOFT) 50 MG tablet Take 1 tablet (50 mg total) by mouth daily.   TRULICITY 1.5 0000000 SOPN Inject 1.5 mg into the skin once a week.   [DISCONTINUED] isosorbide mononitrate (IMDUR) 30 MG 24 hr tablet Take 30 mg by mouth daily.   No facility-administered medications prior to visit.    Review of Systems  Constitutional:   Positive for diaphoresis and malaise/fatigue. Negative for appetite change, chills, fatigue and fever.  Respiratory:  Positive for shortness of breath. Negative for cough, hemoptysis, sputum production and chest tightness.   Cardiovascular:  Positive for chest pain, palpitations and near-syncope. Negative for orthopnea, claudication, syncope and PND.  Gastrointestinal:  Positive for abdominal pain. Negative for nausea and vomiting.  Musculoskeletal:  Positive for back pain.  Neurological:  Positive for dizziness and headaches. Negative for weakness and numbness.       Objective    BP 96/62   Pulse 90   Temp 98.8 F (37.1 C) (Oral)   Resp 18   SpO2 95%  BP Readings from Last 3 Encounters:  12/04/20 132/66  12/02/20 96/62  10/05/20 135/80   Wt Readings from Last 3 Encounters:  10/04/20 167 lb (75.8 kg)  09/24/20 167 lb (75.8 kg)  08/11/20 169 lb (76.7 kg)      Physical Exam Vitals and nursing note reviewed.  Constitutional:      Appearance: Normal appearance. She is normal weight.  HENT:     Right Ear: Tympanic membrane normal.     Left Ear: Tympanic membrane normal.     Nose: Nose normal.     Mouth/Throat:     Mouth: Mucous membranes are moist.     Pharynx: Oropharynx is clear.  Eyes:     Conjunctiva/sclera: Conjunctivae normal.  Cardiovascular:     Rate and Rhythm: Normal rate and regular rhythm.     Pulses: Normal pulses.     Heart sounds: Normal heart sounds.  Pulmonary:     Effort: Pulmonary effort is normal.     Breath sounds: Normal breath sounds.  Abdominal:     General: Bowel sounds are normal.     Palpations: Abdomen is soft.  Musculoskeletal:        General: Normal range of motion.     Cervical back: Normal range of motion and neck supple.  Skin:    General: Skin is warm and dry.  Neurological:     Mental Status: She is alert.  Psychiatric:        Mood and Affect: Mood normal.        Behavior: Behavior normal.        Thought Content: Thought  content normal.        Judgment: Judgment normal.    CG reveals left bundle branch block with rate of about 86   No results found for any visits on 12/02/20.  Assessment & Plan     1. Chest pain, unspecified type Not suspicious of PE or aortic dissection.  Most likely anginal versus skeletal skeletal pain. - EKG 12-Lead - CBC w/Diff/Platelet - Hemoglobin A1c - Comprehensive Metabolic Panel (CMET) - Troponin T - Ambulatory referral to Cardiology  2. Unstable angina (HCC) Double Imdur dose.  For back to cardiology for her significant known CAD - CBC w/Diff/Platelet - Hemoglobin A1c - Comprehensive Metabolic Panel (CMET) - Troponin T - Ambulatory referral  to Cardiology  3. Type 2 diabetes mellitus with diabetic polyneuropathy, without long-term current use of insulin ([place]) Patient already on insulin and Jardiance.  May need to add Ozempic - Hemoglobin A1c  4. Hypertension associated with type 2 diabetes mellitus (HCC)  - isosorbide mononitrate (IMDUR) 120 MG 24 hr tablet; Take 1 tablet (120 mg total) by mouth daily.  Dispense: 30 tablet; Refill: 1  5. Coronary artery disease involving native coronary artery of native heart with unstable angina pectoris ([place])   6. S/P CABG x 4   7. Depression with anxiety On sertraline 100 mg daily    No follow-ups on file.      I, [name], MD, have reviewed all documentation for this visit. The documentation on 12/05/20 for the exam, diagnosis, procedures, and orders are all accurate and complete.    Kynzlee Hucker Cranford Mon, MD  [facility] [phone] (phone) [phone] (fax)  [place]

## 2020-12-02 NOTE — Chronic Care Management (AMB) (Signed)
Chronic Care Management   CCM RN Visit Note  12/02/2020 Name: Candace [name] MRN: [id number] DOB: September 10, 1950  Subjective: Candace [name] is a 70 y.o. year old female who is a primary care patient of [name]., MD. The care management team was consulted for assistance with disease management and care coordination needs.    Engaged with patient by telephone for follow up visit in response to provider referral for case management and care coordination services.   Consent to Services:  The patient was given information about Chronic Care Management services, agreed to services, and gave verbal consent prior to initiation of services.  Please see initial visit note for detailed documentation.    Assessment: Review of patient past medical history, allergies, medications, health status, including review of consultants reports, laboratory and other test data, was performed as part of comprehensive evaluation and provision of chronic care management services.   SDOH (Social Determinants of Health) assessments and interventions performed:  No  CCM Care Plan  Allergies  Allergen Reactions   Lisinopril Cough   Penicillins Swelling, Rash and Other (See Comments)    Did it involve swelling of the face/tongue/throat, SOB, or low BP? Yes Did it involve sudden or severe rash/hives, skin peeling, or any reaction on the inside of your mouth or nose? Yes Did you need to seek medical attention at a hospital or doctor's office? Yes When did it last happen?      15 years If all above answers are "NO", may proceed with cephalosporin use.     Outpatient Encounter Medications as of 12/02/2020  Medication Sig Note   acetaminophen (TYLENOL) 325 MG tablet Take 650 mg by mouth every 6 (six) hours as needed for moderate pain or headache.     bismuth subsalicylate (PEPTO BISMOL) 262 MG chewable tablet Chew 524 mg by mouth as needed.    clopidogrel (PLAVIX) 75 MG tablet Take 1 tablet (75 mg total) by  mouth daily.    ELIQUIS 5 MG TABS tablet Take 5 mg by mouth 2 (two) times daily. 06/06/2020: Prescribed by Dr. Nehemiah Massed   ezetimibe (ZETIA) 10 MG tablet Take 1 tablet (10 mg total) by mouth daily.    insulin glargine (LANTUS SOLOSTAR) 100 UNIT/ML Solostar Pen Inject 10 Units into the skin at bedtime.    Insulin Pen Needle (NOVOFINE PLUS PEN NEEDLE) 32G X 4 MM MISC Use to inject insulin into the skin once daily.    isosorbide mononitrate (IMDUR) 30 MG 24 hr tablet Take 30 mg by mouth daily.    JARDIANCE 25 MG TABS tablet TAKE ONE TABLET BY MOUTH BEFORE BREAKFAST    Lancets (ONETOUCH DELICA PLUS 123XX123) MISC USE UP TO 4 TIMES DAILY AS DIRECTED    loperamide (IMODIUM) 2 MG capsule Take 2 mg by mouth as needed.    losartan (COZAAR) 25 MG tablet TAKE ONE TABLET BY MOUTH EVERYDAY AT BEDTIME    metoprolol tartrate (LOPRESSOR) 100 MG tablet Take 100 mg by mouth 2 (two) times daily. 06/06/2020: Prescribed by Dr. Nehemiah Massed    nitroGLYCERIN (NITROSTAT) 0.4 MG SL tablet Place 1 tablet (0.4 mg total) under the tongue every 5 (five) minutes as needed for chest pain.    nystatin (NYSTATIN) powder Apply 1 application topically daily. (Patient taking differently: Apply 1 application topically daily as needed.)    ondansetron (ZOFRAN-ODT) 8 MG disintegrating tablet Take 1 tablet (8 mg total) by mouth every 8 (eight) hours as needed.    pantoprazole (PROTONIX) 20  MG tablet Take 1 tablet (20 mg total) by mouth 2 (two) times daily.    rosuvastatin (CRESTOR) 40 MG tablet Take 1 tablet (40 mg total) by mouth daily.    sertraline (ZOLOFT) 50 MG tablet Take 1 tablet (50 mg total) by mouth daily.    TRULICITY 1.5 0000000 SOPN Inject 1.5 mg into the skin once a week.    [DISCONTINUED] cetirizine (ZYRTEC) 5 MG tablet Take 2 tablets (10 mg total) by mouth daily.    No facility-administered encounter medications on file as of 12/02/2020.    Patient Active Problem List   Diagnosis Date Noted   Vertigo    Unsteady gait     Acute bronchitis 03/07/2019   Esophageal dysphagia    Stomach irritation    Gastric polyp    Esophageal lump    Hx of colonic polyp    Polyp of colon    Diverticulosis of large intestine without diverticulitis    PSVT (paroxysmal supraventricular tachycardia) ([place]) 10/03/2018   Abnormal ECG 07/05/2018   Tachycardia 03/27/2018   Pain in limb 02/28/2018   Chronic diastolic CHF (congestive heart failure) ([place]) 11/03/2017   TIA (transient ischemic attack) 11/03/2017   COPD suggested by initial evaluation ([place]) 08/23/2017   Acute and chronic respiratory failure with hypoxia ([place]) 08/23/2017   Postoperative state 08/15/2017   Incidental pulmonary nodule 08/01/2017   Non-ST elevation myocardial infarction (NSTEMI), subendocardial infarction, subsequent episode of care ([place]) 06/01/2017   B12 deficiency 12/01/2016   Vitamin D deficiency 11/04/2016   Depression with anxiety 09/30/2016   AF (paroxysmal atrial fibrillation) ([place]) 09/30/2016   Resting tremor 09/30/2016   Mild obstructive sleep apnea 09/30/2016   Headache 08/18/2016   Unstable angina ([place]) 08/03/2016   Type 2 diabetes mellitus with complication, without long-term current use of insulin ([place]) 08/03/2016   Chronic daily headache 05/05/2016   Asthma 11/11/2015   S/P CABG x 4 11/10/2015   Coronary artery disease 11/07/2015   Carotid artery narrowing 02/08/2014   Chest pain 02/08/2014   Mixed hyperlipidemia 02/08/2014   Bilateral carotid artery stenosis 02/08/2014   Atypical chest pain 02/07/2014   Essential hypertension 02/07/2014   Awareness of heartbeats 02/07/2014   D (diarrhea) 10/05/2013   Gastroesophageal reflux disease 10/05/2013    Conditions to be addressed/monitored:CHF  Patient Care Plan: Heart Failure (Adult)     Problem Identified: Symptom Exacerbation (Heart Failure)   Priority: High  Onset Date: 04/15/2020     Long-Range Goal: Symptom Exacerbation Prevented or Minimized   Start Date: 09/09/2020   Expected End Date: 01/07/2021  Recent Progress: On track  Priority: High  Note:    Current Barriers:  Chronic Disease Management support and educational needs r/t CHF.  Case Manager Clinical Goal(s):  Over the next 120 days, patient will not require hospitalization or emergent evaluation d/t complications r/t CHF exacerbation.  Interventions:  Collaboration with [name]., MD regarding development and update of comprehensive plan of care as evidenced by provider attestation and co-signature Inter-disciplinary care team collaboration (see longitudinal plan of care) Reviewed medications and current plan for CHF management. Reports excellent compliance with treatment plan. Called today to reports increased episodes of chest discomfort and fatigue. Reports symptoms started over the weekend and have not resolved. Reports family advised her to go to Urgent Care on Sunday however she declined need for evaluation. Confirmed contacting her Cardiology team and PCP today. She will be evaluated by her primary care provider later today. Reviewed worsening symptoms r/t CHF  exacerbation and indications for seeking immediate medical attention. She agreed to report to the Emergency Room or call 911 if her symptoms worsen prior to being evaluated in the clinic.    Patient Goals/Self-Care Activities:  Take medications as prescribed Follow plan for CHF symptom management and notify provider with concerns Monitor and record weights Adhere to recommended cardiac diet Follow up for clinic evaluation as scheduled    Follow Up Plan:  Will follow up within the next two weeks      PLAN A member of the care management team will follow up with Ms. Azbill within the next two weeks.   [name] Care Management [facility] [phone]

## 2020-12-02 NOTE — Telephone Encounter (Signed)
Pt. Reports she started having left-sided chest pain 1 week ago. Pain lasts 2-3 minutes. Has had diaphoresis with this at times. States she called her cardiologist "and he said to call Dr. Rosanna Randy, he doesn't think it's my heart." No chest pain presently. Spoke with Arbie Cookey in the practice and she will talk with PCP and call pt. Back.    Answer Assessment - Initial Assessment Questions 1. LOCATION: "Where does it hurt?"       Left side 2. RADIATION: "Does the pain go anywhere else?" (e.g., into neck, jaw, arms, back)     Yesterday - left arm 3. ONSET: "When did the chest pain begin?" (Minutes, hours or days)      1 week ago 4. PATTERN "Does the pain come and go, or has it been constant since it started?"  "Does it get worse with exertion?"      Comes and goes 5. DURATION: "How long does it last" (e.g., seconds, minutes, hours)     2-3 MINUTES 6. SEVERITY: "How bad is the pain?"  (e.g., Scale 1-10; mild, moderate, or severe)    - MILD (1-3): doesn't interfere with normal activities     - MODERATE (4-7): interferes with normal activities or awakens from sleep    - SEVERE (8-10): excruciating pain, unable to do any normal activities       7 7. CARDIAC RISK FACTORS: "Do you have any history of heart problems or risk factors for heart disease?" (e.g., angina, prior heart attack; diabetes, high blood pressure, high cholesterol, smoker, or strong family history of heart disease)     CHF,CAD 8. PULMONARY RISK FACTORS: "Do you have any history of lung disease?"  (e.g., blood clots in lung, asthma, emphysema, birth control pills)     COPD 9. CAUSE: "What do you think is causing the chest pain?"     Unsure 10. OTHER SYMPTOMS: "Do you have any other symptoms?" (e.g., dizziness, nausea, vomiting, sweating, fever, difficulty breathing, cough)       Sweaty 11. PREGNANCY: "Is there any chance you are pregnant?" "When was your last menstrual period?"       No  Protocols used: Chest Pain-A-AH

## 2020-12-02 NOTE — Telephone Encounter (Addendum)
Call was triaged and pt was scheduled for an ov appt for 2:40 pm today.

## 2020-12-03 ENCOUNTER — Telehealth: Payer: Self-pay

## 2020-12-03 NOTE — Telephone Encounter (Signed)
Pt calling back. Stated that a note will need to be faxed to dentist. Will need to say how many days patient can be off blood thinners before dentist pulls a tooth.  Fax# [phone] Dr Kara Pacer    Pt would also like to speak to a nurse regarding appointment yesterday. Patient states that she is not feeling any better and would like to know if she can take something else. Please advise

## 2020-12-03 NOTE — Telephone Encounter (Signed)
Please review. Also, patient's daughter is concerned because she is not feeling any better. She reports that her BP has been running low recently. She wanted to know why the Imdur was increased? Please advise. Thanks!

## 2020-12-03 NOTE — Telephone Encounter (Signed)
Due to probable angina.

## 2020-12-03 NOTE — Telephone Encounter (Signed)
Please advise 

## 2020-12-03 NOTE — Telephone Encounter (Signed)
Copied from [place] [phone]. Topic: General - Other >> Dec 02, 2020  4:53 PM Celene Kras wrote: Reason for CRM: Pt called and is requesting to know how long she is able to be off of her blood thinners while the dentist pulls a tooth. Please advise.

## 2020-12-04 ENCOUNTER — Other Ambulatory Visit: Payer: Self-pay

## 2020-12-04 ENCOUNTER — Emergency Department
Admission: EM | Admit: 2020-12-04 | Discharge: 2020-12-04 | Disposition: A | Discharge status: Home / Self Care | Payer: Medicare Other | Attending: Emergency Medicine | Admitting: Emergency Medicine

## 2020-12-04 ENCOUNTER — Ambulatory Visit: Payer: Self-pay

## 2020-12-04 ENCOUNTER — Emergency Department: Payer: Medicare Other

## 2020-12-04 DIAGNOSIS — Z7984 Long term (current) use of oral hypoglycemic drugs: Secondary | ICD-10-CM | POA: Insufficient documentation

## 2020-12-04 DIAGNOSIS — J449 Chronic obstructive pulmonary disease, unspecified: Secondary | ICD-10-CM | POA: Diagnosis not present

## 2020-12-04 DIAGNOSIS — I110 Hypertensive heart disease with heart failure: Secondary | ICD-10-CM | POA: Diagnosis not present

## 2020-12-04 DIAGNOSIS — I5032 Chronic diastolic (congestive) heart failure: Secondary | ICD-10-CM | POA: Diagnosis not present

## 2020-12-04 DIAGNOSIS — I2510 Atherosclerotic heart disease of native coronary artery without angina pectoris: Secondary | ICD-10-CM | POA: Insufficient documentation

## 2020-12-04 DIAGNOSIS — Z87891 Personal history of nicotine dependence: Secondary | ICD-10-CM | POA: Insufficient documentation

## 2020-12-04 DIAGNOSIS — Z794 Long term (current) use of insulin: Secondary | ICD-10-CM | POA: Insufficient documentation

## 2020-12-04 DIAGNOSIS — I4891 Unspecified atrial fibrillation: Secondary | ICD-10-CM | POA: Diagnosis not present

## 2020-12-04 DIAGNOSIS — Z7902 Long term (current) use of antithrombotics/antiplatelets: Secondary | ICD-10-CM | POA: Diagnosis not present

## 2020-12-04 DIAGNOSIS — Z79899 Other long term (current) drug therapy: Secondary | ICD-10-CM | POA: Diagnosis not present

## 2020-12-04 DIAGNOSIS — E119 Type 2 diabetes mellitus without complications: Secondary | ICD-10-CM | POA: Insufficient documentation

## 2020-12-04 DIAGNOSIS — I480 Paroxysmal atrial fibrillation: Secondary | ICD-10-CM

## 2020-12-04 DIAGNOSIS — R079 Chest pain, unspecified: Secondary | ICD-10-CM | POA: Diagnosis not present

## 2020-12-04 DIAGNOSIS — M542 Cervicalgia: Secondary | ICD-10-CM | POA: Diagnosis not present

## 2020-12-04 DIAGNOSIS — Z7901 Long term (current) use of anticoagulants: Secondary | ICD-10-CM | POA: Insufficient documentation

## 2020-12-04 DIAGNOSIS — Z951 Presence of aortocoronary bypass graft: Secondary | ICD-10-CM | POA: Diagnosis not present

## 2020-12-04 DIAGNOSIS — R0789 Other chest pain: Secondary | ICD-10-CM | POA: Diagnosis not present

## 2020-12-04 LAB — CBC
HCT: 36.9 % (ref 36.0–46.0)
Hemoglobin: 12.3 g/dL (ref 12.0–15.0)
MCH: 32.2 pg (ref 26.0–34.0)
MCHC: 33.3 g/dL (ref 30.0–36.0)
MCV: 96.6 fL (ref 80.0–100.0)
Platelets: 185 10*3/uL (ref 150–400)
RBC: 3.82 MIL/uL — ABNORMAL LOW (ref 3.87–5.11)
RDW: 12.5 % (ref 11.5–15.5)
WBC: 6.1 10*3/uL (ref 4.0–10.5)
nRBC: 0 % (ref 0.0–0.2)

## 2020-12-04 LAB — BASIC METABOLIC PANEL
Anion gap: 7 (ref 5–15)
BUN: 9 mg/dL (ref 8–23)
CO2: 25 mmol/L (ref 22–32)
Calcium: 9.2 mg/dL (ref 8.9–10.3)
Chloride: 107 mmol/L (ref 98–111)
Creatinine, Ser: 0.65 mg/dL (ref 0.44–1.00)
GFR, Estimated: >60 mL/min (ref >60)
Glucose, Bld: 279 mg/dL — ABNORMAL HIGH (ref 70–99)
Potassium: 4.1 mmol/L (ref 3.5–5.1)
Sodium: 139 mmol/L (ref 135–145)

## 2020-12-04 LAB — TROPONIN I (HIGH SENSITIVITY)
Troponin I (High Sensitivity): 7 ng/L (ref <18)
Troponin I (High Sensitivity): 7 ng/L (ref <18)

## 2020-12-04 MED ORDER — NAPROXEN 500 MG PO TABS
500.0000 mg | ORAL_TABLET | Freq: Once | ORAL | Status: AC
  Administered 2020-12-04: 500 mg via ORAL
  Filled 2020-12-04: qty 1

## 2020-12-04 NOTE — Discharge Instructions (Addendum)
Your tests today were all okay.  Please follow up with cardiology clinic as previously arranged for further evaluation of your symptoms.

## 2020-12-04 NOTE — ED Provider Notes (Signed)
[facility] Emergency Department Provider Note  ____________________________________________  Time seen: Approximately 3:18 PM  I have reviewed the triage vital signs and the nursing notes.   HISTORY  Chief Complaint Chest Pain    HPI AMBERIA SEIFERT is a 70 y.o. female with a history of atrial fibrillation diabetes hypertension CAD who comes ED complaining of left neck pain radiating to the left shoulder.  Not in the chest, not exertional, not pleuritic.  Intermittent, worse with movement, no alleviating factors.  Currently 8/10.  Also reports some recent episodes of low blood pressure and passing out.  Currently no dizziness, no palpitations.  Reports compliance with medication, tried nitroglycerin at home without relief.  PCP recently prescribed Imdur.  They referred her to cardiology.    Past Medical History:  Diagnosis Date   Allergy    Anemia    Anxiety    Arrhythmia    Arthritis    Atrial fibrillation ([place])    Coronary artery disease    Depression    Diabetes mellitus without complication ([place])    Dyspnea    doe   Dysrhythmia    GERD (gastroesophageal reflux disease)    Headache    History of hiatal hernia    Hyperlipidemia    Hypertension    Myocardial infarction ([employer])    2016, 04/2017   Myocardial infarction with cardiac rehabilitation [facility])    MI 2016/ CABG 8/17    FINISHED CARDIAC REHAB 3 WEEKS AGO   Panic attack    Pneumonia    Reflux    Stroke ([place]) 2015   showed up on MRI; no weakness noted   TIA (transient ischemic attack)    Voice tremor      Patient Active Problem List   Diagnosis Date Noted   Vertigo    Unsteady gait    Acute bronchitis 03/07/2019   Esophageal dysphagia    Stomach irritation    Gastric polyp    Esophageal lump    Hx of colonic polyp    Polyp of colon    Diverticulosis of large intestine without diverticulitis    PSVT (paroxysmal supraventricular tachycardia) ([place]) 10/03/2018   Abnormal ECG  07/05/2018   Tachycardia 03/27/2018   Pain in limb 02/28/2018   Chronic diastolic CHF (congestive heart failure) ([place]) 11/03/2017   TIA (transient ischemic attack) 11/03/2017   COPD suggested by initial evaluation ([place]) 08/23/2017   Acute and chronic respiratory failure with hypoxia ([place]) 08/23/2017   Postoperative state 08/15/2017   Incidental pulmonary nodule 08/01/2017   Non-ST elevation myocardial infarction (NSTEMI), subendocardial infarction, subsequent episode of care ([place]) 06/01/2017   B12 deficiency 12/01/2016   Vitamin D deficiency 11/04/2016   Depression with anxiety 09/30/2016   AF (paroxysmal atrial fibrillation) ([place]) 09/30/2016   Resting tremor 09/30/2016   Mild obstructive sleep apnea 09/30/2016   Headache 08/18/2016   Unstable angina ([place]) 08/03/2016   Type 2 diabetes mellitus with complication, without long-term current use of insulin ([place]) 08/03/2016   Chronic daily headache 05/05/2016   Asthma 11/11/2015   S/P CABG x 4 11/10/2015   Coronary artery disease 11/07/2015   Carotid artery narrowing 02/08/2014   Chest pain 02/08/2014   Mixed hyperlipidemia 02/08/2014   Bilateral carotid artery stenosis 02/08/2014   Atypical chest pain 02/07/2014   Essential hypertension 02/07/2014   Awareness of heartbeats 02/07/2014   D (diarrhea) 10/05/2013   Gastroesophageal reflux disease 10/05/2013     Past Surgical History:  Procedure Laterality Date  ABDOMINAL HYSTERECTOMY     APPENDECTOMY  1975   ARTERY BIOPSY Right 04/26/2016   Procedure: BIOPSY TEMPORAL ARTERY;  Surgeon: Margaretha Sheffield, MD;  Location: [facility] ORS;  Service: ENT;  Laterality: Right;   CARDIAC CATHETERIZATION N/A 11/06/2015   Procedure: Left Heart Cath and Coronary Angiography;  Surgeon: Corey Skains, MD;  Location: [place] [facility];  Service: Cardiovascular;  Laterality: N/A;   CESAREAN SECTION     COLONOSCOPY  2015   COLONOSCOPY WITH PROPOFOL N/A 11/03/2018   Procedure: COLONOSCOPY WITH PROPOFOL;   Surgeon: Virgel Manifold, MD;  Location: [facility] END[facility];  Service: Endoscopy;  Laterality: N/A;   CORONARY ANGIOPLASTY  04/2017   [place]   CORONARY ARTERY BYPASS GRAFT N/A 11/10/2015   Procedure: CORONARY ARTERY BYPASS GRAFTING (CABG), ON PUMP, TIMES FOUR, USING LEFT INTERNAL MAMMARY ARTERY, BILATERAL GREATER SAPHENOUS VEINS HARVESTED ENDOSCOPICALLY;  Surgeon: [name], MD;  Location: [place];  Service: Open Heart Surgery;  Laterality: N/A;  LIMA-LAD; SEQ SVG-OM1-OM2; SVG-PL   CORONARY STENT INTERVENTION N/A 08/05/2016   Procedure: Coronary Stent Intervention;  Surgeon: Isaias Cowman, MD;  Location: [place] [facility];  Service: Cardiovascular;  Laterality: N/A;   ESOPHAGOGASTRODUODENOSCOPY (EGD) WITH PROPOFOL N/A 11/03/2018   Procedure: ESOPHAGOGASTRODUODENOSCOPY (EGD) WITH PROPOFOL;  Surgeon: Virgel Manifold, MD;  Location: [facility];  Service: Endoscopy;  Laterality: N/A;   HYSTERECTOMY ABDOMINAL WITH SALPINGO-OOPHORECTOMY Bilateral 08/15/2017   Procedure: HYSTERECTOMY ABDOMINAL WITH BILATERAL SALPINGO-OOPHORECTOMY;  Surgeon: Rubie Maid, MD;  Location: [facility];  Service: Gynecology;  Laterality: Bilateral;   LEFT HEART CATH AND CORONARY ANGIOGRAPHY N/A 08/05/2016   Procedure: Left Heart Cath and Coronary Angiography;  Surgeon: Isaias Cowman, MD;  Location: [place] [facility];  Service: Cardiovascular;  Laterality: N/A;   LEFT HEART CATH AND CORS/GRAFTS ANGIOGRAPHY N/A 09/19/2018   Procedure: LEFT HEART CATH AND CORS/GRAFTS ANGIOGRAPHY;  Surgeon: Corey Skains, MD;  Location: [place] [facility];  Service: Cardiovascular;  Laterality: N/A;   LEFT HEART CATH AND CORS/GRAFTS ANGIOGRAPHY N/A 08/30/2019   Procedure: LEFT HEART CATH AND CORS/GRAFTS ANGIOGRAPHY;  Surgeon: Teodoro Spray, MD;  Location: [place] [facility];  Service: Cardiovascular;  Laterality: N/A;   OOPHORECTOMY     TEE WITHOUT CARDIOVERSION N/A 11/10/2015   Procedure:  TRANSESOPHAGEAL ECHOCARDIOGRAM (TEE);  Surgeon: [name], MD;  Location: [place];  Service: Open Heart Surgery;  Laterality: N/A;   TUBAL LIGATION       Prior to Admission medications   Medication Sig Start Date End Date Taking? Authorizing Provider  acetaminophen (TYLENOL) 325 MG tablet Take 650 mg by mouth every 6 (six) hours as needed for moderate pain or headache.     [provider]  bismuth subsalicylate (PEPTO BISMOL) 262 MG chewable tablet Chew 524 mg by mouth as needed.    [provider]  clopidogrel (PLAVIX) 75 MG tablet Take 1 tablet (75 mg total) by mouth daily. 09/05/20   [name]., MD  ELIQUIS 5 MG TABS tablet Take 5 mg by mouth 2 (two) times daily. 03/26/20   [provider]  ezetimibe (ZETIA) 10 MG tablet Take 1 tablet (10 mg total) by mouth daily. 09/05/20   [name]., MD  insulin glargine (LANTUS SOLOSTAR) 100 UNIT/ML Solostar Pen Inject 10 Units into the skin at bedtime. 11/27/20   [name]., MD  Insulin Pen Needle (NOVOFINE PLUS PEN NEEDLE) 32G X 4 MM MISC Use to inject insulin into  the skin once daily. 11/25/20   [name]., MD  isosorbide mononitrate (IMDUR) 120 MG 24 hr tablet Take 1 tablet (120 mg total) by mouth daily. 12/02/20   [name]., MD  JARDIANCE 25 MG TABS tablet TAKE ONE TABLET BY MOUTH BEFORE BREAKFAST 11/05/20   [name]., MD  Lancets [facility] DELICA PLUS [id number]) MISC USE UP TO 4 TIMES DAILY AS DIRECTED 07/10/19   [name]., MD  loperamide (IMODIUM) 2 MG capsule Take 2 mg by mouth as needed.    [provider]  losartan (COZAAR) 25 MG tablet TAKE ONE TABLET BY MOUTH EVERYDAY AT BEDTIME 11/12/20   [name]., MD  metoprolol tartrate (LOPRESSOR) 100 MG tablet Take 100 mg by mouth 2 (two) times daily. 10/03/18   [provider]  nitroGLYCERIN (NITROSTAT) 0.4 MG SL tablet Place 1 tablet (0.4 mg total) under the tongue  every 5 (five) minutes as needed for chest pain. 07/27/16   Bettey Costa, MD  nystatin (NYSTATIN) powder Apply 1 application topically daily. Patient taking differently: Apply 1 application topically daily as needed. 02/14/20   Rubie Maid, MD  ondansetron (ZOFRAN-ODT) 8 MG disintegrating tablet Take 1 tablet (8 mg total) by mouth every 8 (eight) hours as needed. 07/29/20   [name]., MD  pantoprazole (PROTONIX) 20 MG tablet Take 1 tablet (20 mg total) by mouth 2 (two) times daily. 06/10/20   [name]., MD  rosuvastatin (CRESTOR) 40 MG tablet Take 1 tablet (40 mg total) by mouth daily. 11/17/20   [name]., MD  sertraline (ZOLOFT) 50 MG tablet Take 1 tablet (50 mg total) by mouth daily. 06/27/20   [name]., MD  TRULICITY 1.5 0000000 SOPN Inject 1.5 mg into the skin once a week. 11/09/20   [name]., MD  cetirizine (ZYRTEC) 5 MG tablet Take 2 tablets (10 mg total) by mouth daily. 12/19/18 02/06/20  [name]., MD     Allergies Lisinopril and Penicillins   Family History  Problem Relation Age of Onset   Cancer Father    Hypertension Father    Heart disease Father    Asthma Father    Cancer Mother    Hypertension Mother    Pancreatic cancer Mother 42   Cancer Sister    Breast cancer Sister 30   Breast cancer Sister 15   Lung cancer Brother    Pancreatic cancer Sister 26   Cancer Sister     Social History Social History   Tobacco Use   Smoking status: Former    Packs/day: 0.50    Types: Cigarettes    Quit date: 10/07/2001    Years since quitting: 19.1   Smokeless tobacco: Never  Vaping Use   Vaping Use: Never used  Substance Use Topics   Alcohol use: No    Alcohol/week: 0.0 standard drinks   Drug use: No    Review of Systems  Constitutional:   No fever or chills.  ENT:   No sore throat. No rhinorrhea. Cardiovascular:   No chest pain positive syncope. Respiratory:   No dyspnea or  cough. Gastrointestinal:   Negative for abdominal pain, vomiting and diarrhea.  Musculoskeletal:   Positive left shoulder pain All other systems reviewed and are negative except as documented above in ROS and HPI.  ____________________________________________   PHYSICAL EXAM:  VITAL SIGNS: ED Triage Vitals  Enc Vitals Group  BP 12/04/20 1108 110/71     Pulse Rate 12/04/20 1108 78     Resp 12/04/20 1108 17     Temp 12/04/20 1109 98.3 F (36.8 C)     Temp Source 12/04/20 1108 Oral     SpO2 12/04/20 1108 97 %     Weight --      Height 12/04/20 1109 '5\' 2"'$  (1.575 m)     Head Circumference --      Peak Flow --      Pain Score 12/04/20 1110 8     Pain Loc --      Pain Edu? --      Excl. in [place]? --     Vital signs reviewed, nursing assessments reviewed.   Constitutional:   Alert and oriented. Non-toxic appearance. Eyes:   Conjunctivae are normal. EOMI. PERRL. ENT      Head:   Normocephalic and atraumatic.      Nose:   Wearing a mask.      Mouth/Throat:   Wearing a mask.      Neck:   No meningismus. Full ROM. Hematological/Lymphatic/Immunilogical:   No cervical lymphadenopathy. Cardiovascular:   RRR. Symmetric bilateral radial and DP pulses.  No murmurs. Cap refill less than 2 seconds. Respiratory:   Normal respiratory effort without tachypnea/retractions. Breath sounds are clear and equal bilaterally. No wheezes/rales/rhonchi. Gastrointestinal:   Soft and nontender. Non distended. There is no CVA tenderness.  No rebound, rigidity, or guarding. Genitourinary:   deferred Musculoskeletal:   Normal range of motion in all extremities. No joint effusions.  No lower extremity tenderness.  No edema. Neurologic:   Normal speech and language.  Motor grossly intact. No acute focal neurologic deficits are appreciated.  Skin:    Skin is warm, dry and intact. No rash noted.  No petechiae, purpura, or bullae.  ____________________________________________    LABS (pertinent  positives/negatives) (all labs ordered are listed, but only abnormal results are displayed) Labs Reviewed  BASIC METABOLIC PANEL - Abnormal; Notable for the following components:      Result Value   Glucose, Bld 279 (*)    All other components within normal limits  CBC - Abnormal; Notable for the following components:   RBC 3.82 (*)    All other components within normal limits  TROPONIN I (HIGH SENSITIVITY)  TROPONIN I (HIGH SENSITIVITY)   ____________________________________________   EKG  Interpreted by me Normal sinus rhythm rate of 86, normal axis, first-degree AV block.  Left bundle branch block.  No acute ischemic changes.  EKG unchanged compared to previous EKGs including on October 04, 2020.  ____________________________________________    RADIOLOGY  DG Chest 2 View  Result Date: 12/04/2020 CLINICAL DATA:  Chest pain EXAM: CHEST - 2 VIEW COMPARISON:  Chest radiograph 10/04/2020 FINDINGS: Median sternotomy wires and mediastinal surgical clips are stable. There is no focal consolidation or pulmonary edema. There is no pleural effusion or pneumothorax. There is degenerative change of both shoulders. There is no acute osseous abnormality. IMPRESSION: No active cardiopulmonary disease. Electronically Signed   By: Valetta Mole M.D.   On: 12/04/2020 11:59    ____________________________________________   PROCEDURES Procedures  ____________________________________________  DIFFERENTIAL DIAGNOSIS   Non-STEMI, musculoskeletal pain, pneumothorax, pneumonia  CLINICAL IMPRESSION / ASSESSMENT AND PLAN / ED COURSE  Medications ordered in the ED: Medications  naproxen (NAPROSYN) tablet 500 mg (500 mg Oral Given 12/04/20 1328)    Pertinent labs & imaging results that were available during my care of the patient were  reviewed by me and considered in my medical decision making (see chart for details).  JERISSA SHANOR was evaluated in Emergency Department on 12/04/2020 for the symptoms  described in the history of present illness. She was evaluated in the context of the global COVID-19 pandemic, which necessitated consideration that the patient might be at risk for infection with the SARS-CoV-2 virus that causes COVID-19. Institutional protocols and algorithms that pertain to the evaluation of patients at risk for COVID-19 are in a state of rapid change based on information released by regulatory bodies including the CDC and federal and state organizations. These policies and algorithms were followed during the patient's care in the ED.   Patient presents with atypical pain, likely musculoskeletal left Shoulder pain.  Vital signs are normal.  Orthostatics are normal.  Chest x-ray and labs including serial troponins are all normal.  Patient received naproxen in the ED and is feeling much better.  Stable for discharge home to follow-up with outpatient cardiology and PCP.   Considering the patient's symptoms, medical history, and physical examination today, I have low suspicion for ACS, PE, TAD, pneumothorax, carditis, mediastinitis, pneumonia, CHF, or sepsis.  Doubt carotid or vertebral artery dissection/occlusion.       ____________________________________________   FINAL CLINICAL IMPRESSION(S) / ED DIAGNOSES    Final diagnoses:  Musculoskeletal neck pain     ED Discharge Orders     None       Portions of this note were generated with dragon dictation software. Dictation errors may occur despite best attempts at proofreading.    Carrie Mew, MD 12/04/20 [phone]

## 2020-12-04 NOTE — Telephone Encounter (Signed)
Patient is still not feeling well. Called cardiology to see if they could see the patient today or tomorrow for evaluation. Patient was able to get in to see the PA. Patient was advised.

## 2020-12-04 NOTE — ED Triage Notes (Signed)
Pt c/o intermittent chest pain for the past week, pt states she has had an episode of syncope in the past week , states her b/p has been running low 0000000 systolic

## 2020-12-05 ENCOUNTER — Ambulatory Visit: Payer: Self-pay

## 2020-12-05 DIAGNOSIS — I480 Paroxysmal atrial fibrillation: Secondary | ICD-10-CM

## 2020-12-05 DIAGNOSIS — I5032 Chronic diastolic (congestive) heart failure: Secondary | ICD-10-CM

## 2020-12-05 LAB — CBC WITH DIFFERENTIAL/PLATELET
Basophils Absolute: 0 10*3/uL (ref 0.0–0.2)
Basos: 1 %
EOS (ABSOLUTE): 0.2 10*3/uL (ref 0.0–0.4)
Eos: 2 %
Hematocrit: 37.1 % (ref 34.0–46.6)
Hemoglobin: 12.4 g/dL (ref 11.1–15.9)
Immature Grans (Abs): 0 10*3/uL (ref 0.0–0.1)
Immature Granulocytes: 0 %
Lymphocytes Absolute: 2.7 10*3/uL (ref 0.7–3.1)
Lymphs: 39 %
MCH: 31.8 pg (ref 26.6–33.0)
MCHC: 33.4 g/dL (ref 31.5–35.7)
MCV: 95 fL (ref 79–97)
Monocytes Absolute: 0.5 10*3/uL (ref 0.1–0.9)
Monocytes: 7 %
Neutrophils Absolute: 3.5 10*3/uL (ref 1.4–7.0)
Neutrophils: 51 %
Platelets: 189 10*3/uL (ref 150–450)
RBC: 3.9 x10E6/uL (ref 3.77–5.28)
RDW: 12 % (ref 11.7–15.4)
WBC: 6.8 10*3/uL (ref 3.4–10.8)

## 2020-12-05 LAB — COMPREHENSIVE METABOLIC PANEL
ALT: 18 IU/L (ref 0–32)
AST: 22 IU/L (ref 0–40)
Albumin/Globulin Ratio: 1.8 (ref 1.2–2.2)
Albumin: 4 g/dL (ref 3.8–4.8)
Alkaline Phosphatase: 108 IU/L (ref 44–121)
BUN/Creatinine Ratio: 11 — ABNORMAL LOW (ref 12–28)
BUN: 10 mg/dL (ref 8–27)
Bilirubin Total: 0.2 mg/dL (ref 0.0–1.2)
CO2: 23 mmol/L (ref 20–29)
Calcium: 8.7 mg/dL (ref 8.7–10.3)
Chloride: 108 mmol/L — ABNORMAL HIGH (ref 96–106)
Creatinine, Ser: 0.93 mg/dL (ref 0.57–1.00)
Globulin, Total: 2.2 g/dL (ref 1.5–4.5)
Glucose: 289 mg/dL — ABNORMAL HIGH (ref 65–99)
Potassium: 4.1 mmol/L (ref 3.5–5.2)
Sodium: 144 mmol/L (ref 134–144)
Total Protein: 6.2 g/dL (ref 6.0–8.5)
eGFR: 67 mL/min/{1.73_m2} (ref >59)

## 2020-12-05 LAB — HEMOGLOBIN A1C
Est. average glucose Bld gHb Est-mCnc: 260 mg/dL
Hgb A1c MFr Bld: 10.7 % — ABNORMAL HIGH (ref 4.8–5.6)

## 2020-12-05 LAB — TROPONIN T: Troponin T (Highly Sensitive): 11 ng/L (ref 0–14)

## 2020-12-05 NOTE — Chronic Care Management (AMB) (Signed)
Chronic Care Management   CCM RN Visit Note  12/05/2020 Name: Candace [name] MRN: [id number] DOB: [birth year]  Subjective: CHAKAKHAN [name] is a 70 y.o. year old female who is a primary care patient of [name]., MD. The care management team was consulted for assistance with disease management and care coordination needs.    Engaged with patient by telephone for follow up visit in response to provider referral for case management and care coordination services.   Consent to Services:  The patient was given information about Chronic Care Management services, agreed to services, and gave verbal consent prior to initiation of services.  Please see initial visit note for detailed documentation.   Assessment: Review of patient past medical history, allergies, medications, health status, including review of consultants reports, laboratory and other test data, was performed as part of comprehensive evaluation and provision of chronic care management services.   SDOH (Social Determinants of Health) assessments and interventions performed: No    CCM Care Plan  Allergies  Allergen Reactions   Lisinopril Cough   Penicillins Swelling, Rash and Other (See Comments)    Did it involve swelling of the face/tongue/throat, SOB, or low BP? Yes Did it involve sudden or severe rash/hives, skin peeling, or any reaction on the inside of your mouth or nose? Yes Did you need to seek medical attention at a hospital or doctor's office? Yes When did it last happen?      15 years If all above answers are "NO", may proceed with cephalosporin use.     Outpatient Encounter Medications as of 12/05/2020  Medication Sig Note   acetaminophen (TYLENOL) 325 MG tablet Take 650 mg by mouth every 6 (six) hours as needed for moderate pain or headache.     bismuth subsalicylate (PEPTO BISMOL) 262 MG chewable tablet Chew 524 mg by mouth as needed.    clopidogrel (PLAVIX) 75 MG tablet Take 1 tablet (75 mg total) by  mouth daily.    ELIQUIS 5 MG TABS tablet Take 5 mg by mouth 2 (two) times daily. 06/06/2020: Prescribed by Dr. Nehemiah Massed   ezetimibe (ZETIA) 10 MG tablet Take 1 tablet (10 mg total) by mouth daily.    insulin glargine (LANTUS SOLOSTAR) 100 UNIT/ML Solostar Pen Inject 10 Units into the skin at bedtime.    Insulin Pen Needle (NOVOFINE PLUS PEN NEEDLE) 32G X 4 MM MISC Use to inject insulin into the skin once daily.    isosorbide mononitrate (IMDUR) 120 MG 24 hr tablet Take 1 tablet (120 mg total) by mouth daily.    JARDIANCE 25 MG TABS tablet TAKE ONE TABLET BY MOUTH BEFORE BREAKFAST    Lancets (ONETOUCH DELICA PLUS 123XX123) MISC USE UP TO 4 TIMES DAILY AS DIRECTED    loperamide (IMODIUM) 2 MG capsule Take 2 mg by mouth as needed.    losartan (COZAAR) 25 MG tablet TAKE ONE TABLET BY MOUTH EVERYDAY AT BEDTIME    metoprolol tartrate (LOPRESSOR) 100 MG tablet Take 100 mg by mouth 2 (two) times daily. 06/06/2020: Prescribed by Dr. Nehemiah Massed    nitroGLYCERIN (NITROSTAT) 0.4 MG SL tablet Place 1 tablet (0.4 mg total) under the tongue every 5 (five) minutes as needed for chest pain.    nystatin (NYSTATIN) powder Apply 1 application topically daily. (Patient taking differently: Apply 1 application topically daily as needed.)    ondansetron (ZOFRAN-ODT) 8 MG disintegrating tablet Take 1 tablet (8 mg total) by mouth every 8 (eight) hours as needed.  pantoprazole (PROTONIX) 20 MG tablet Take 1 tablet (20 mg total) by mouth 2 (two) times daily.    rosuvastatin (CRESTOR) 40 MG tablet Take 1 tablet (40 mg total) by mouth daily.    sertraline (ZOLOFT) 50 MG tablet Take 1 tablet (50 mg total) by mouth daily.    TRULICITY 1.5 0000000 SOPN Inject 1.5 mg into the skin once a week.    [DISCONTINUED] cetirizine (ZYRTEC) 5 MG tablet Take 2 tablets (10 mg total) by mouth daily.    No facility-administered encounter medications on file as of 12/05/2020.    Patient Active Problem List   Diagnosis Date Noted   Vertigo     Unsteady gait    Acute bronchitis 03/07/2019   Esophageal dysphagia    Stomach irritation    Gastric polyp    Esophageal lump    Hx of colonic polyp    Polyp of colon    Diverticulosis of large intestine without diverticulitis    PSVT (paroxysmal supraventricular tachycardia) ([place]) 10/03/2018   Abnormal ECG 07/05/2018   Tachycardia 03/27/2018   Pain in limb 02/28/2018   Chronic diastolic CHF (congestive heart failure) ([place]) 11/03/2017   TIA (transient ischemic attack) 11/03/2017   COPD suggested by initial evaluation ([place]) 08/23/2017   Acute and chronic respiratory failure with hypoxia ([place]) 08/23/2017   Postoperative state 08/15/2017   Incidental pulmonary nodule 08/01/2017   Non-ST elevation myocardial infarction (NSTEMI), subendocardial infarction, subsequent episode of care ([place]) 06/01/2017   B12 deficiency 12/01/2016   Vitamin D deficiency 11/04/2016   Depression with anxiety 09/30/2016   AF (paroxysmal atrial fibrillation) ([place]) 09/30/2016   Resting tremor 09/30/2016   Mild obstructive sleep apnea 09/30/2016   Headache 08/18/2016   Unstable angina ([place]) 08/03/2016   Type 2 diabetes mellitus with complication, without long-term current use of insulin ([place]) 08/03/2016   Chronic daily headache 05/05/2016   Asthma 11/11/2015   S/P CABG x 4 11/10/2015   Coronary artery disease 11/07/2015   Carotid artery narrowing 02/08/2014   Chest pain 02/08/2014   Mixed hyperlipidemia 02/08/2014   Bilateral carotid artery stenosis 02/08/2014   Atypical chest pain 02/07/2014   Essential hypertension 02/07/2014   Awareness of heartbeats 02/07/2014   D (diarrhea) 10/05/2013   Gastroesophageal reflux disease 10/05/2013    Conditions to be addressed/monitored:Atrial Fibrillation and CHF  Patient Care Plan: Heart Failure (Adult)     Problem Identified: Symptom Exacerbation (Heart Failure)   Priority: High  Onset Date: 04/15/2020     Long-Range Goal: Symptom Exacerbation Prevented  or Minimized   Start Date: 09/09/2020  Expected End Date: 01/07/2021  Recent Progress: On track  Priority: High  Note:    Current Barriers:  Chronic Disease Management support and educational needs r/t CHF.  Case Manager Clinical Goal(s):  Over the next 120 days, patient will not require hospitalization or emergent evaluation d/t complications r/t CHF exacerbation.  Interventions:  Collaboration with [name]., MD regarding development and update of comprehensive plan of care as evidenced by provider attestation and co-signature Inter-disciplinary care team collaboration (see longitudinal plan of care) Reviewed medications and current plan for CHF management. Reports excellent compliance with medications. Report doing well with monitoring sodium and nutritional intake.  Discussed weight parameters and indications for notifying a provider. Reports not weighing daily over the past few weeks but will start weighing and recording readings. Advised to contact provider for weight gain greater than 3 lbs overnight or 5 lbs within a week. She was recently evaluated  in the ED for unrelieved chest pain. Reports feeling well since returning home. No episodes of chest discomfort, palpitations of shortness of breath at the time of call. Thoroughly reviewed s/sx of CHF exacerbation and indications for seeking immediate medical attention.     Patient Goals/Self-Care Activities:  Take medications as prescribed Follow plan for CHF symptom management and notify provider with concerns Monitor and record weights Adhere to recommended cardiac diet Follow up for clinic evaluation as scheduled    Follow Up Plan:  Will follow up within the next month     Patient Care Plan: Atrial Fibrillation (Adult)     Problem Identified: Dysrhythmia (Atrial Fibrillation)      Long-Range Goal: Heart Rate and Rhythm Monitored and Managed   Start Date: 12/05/2020  Expected End Date: 03/05/2021   Priority: High  Note:   Current Barriers:  Chronic Disease Management support and educational needs related to A-Fib.  Case Manager Clinical Goal(s):  Over the next 90 days, patient will not require hospitalization due to complications related to A-Fib.  Interventions:  Collaboration with [name]., MD regarding development and update of comprehensive plan of care as evidenced by provider attestation and co-signature Inter-disciplinary care team collaboration (see longitudinal plan of care) Reviewed medications and current treatment plan. Advised to continue taking medications as prescribed. Advised to notify the Cardiology team if unable to tolerate the prescribed regimen. Discussed symptoms since being in the Emergency Department on yesterday. Reports doing well at time of call. Denies chest discomfort, palpitations or shortness of breath. Advised to assess symptoms and monitor blood pressure and pulse daily. Discussed concerns regarding pending an elective dental procedure later this month. She will be required to stop taking blood thinners prior to her tooth extraction. She will require clearance from the PCP or the Cardiology team. Advised to reschedule the procedure for a later date given her recent symptoms and ED evaluation. Agreed to submit clearance request and reschedule the dental procedure for October or later. Reviewed ED discharge instructions and required follow up appointments. Thoroughly reviewed indications for seeking immediate medical attention.   Self-Care/Patient Goals: Take medications as prescribed Attend provider appointment as scheduled Assess symptoms daily Seek immediate medical attention for worsening/unrelieved symptoms Submit request/clearance form for dental procedure Contact the clinic with questions and new concerns if needed   Follow Up Plan:  Will follow up within the next month      PLAN A member of the care management team will  follow up next month   [name] Care Management [facility] [phone]

## 2020-12-05 NOTE — Patient Instructions (Addendum)
Thank you for allowing the Chronic Care Management team to participate in your care.    Patient Care Plan: Heart Failure (Adult)     Problem Identified: Symptom Exacerbation (Heart Failure)   Priority: High  Onset Date: 04/15/2020     Long-Range Goal: Symptom Exacerbation Prevented or Minimized   Start Date: 09/09/2020  Expected End Date: 01/07/2021  Recent Progress: On track  Priority: High  Note:    Current Barriers:  Chronic Disease Management support and educational needs r/t CHF.  Case Manager Clinical Goal(s):  Over the next 120 days, patient will not require hospitalization or emergent evaluation d/t complications r/t CHF exacerbation.  Interventions:  Collaboration with [name]., MD regarding development and update of comprehensive plan of care as evidenced by provider attestation and co-signature Inter-disciplinary care team collaboration (see longitudinal plan of care) Reviewed medications and current plan for CHF management. Reports excellent compliance with medications. Report doing well with monitoring sodium and nutritional intake.  Discussed weight parameters and indications for notifying a provider. Reports not weighing daily over the past few weeks but will start weighing and recording readings. Advised to contact provider for weight gain greater than 3 lbs overnight or 5 lbs within a week. She was recently evaluated in the ED for unrelieved chest pain. Reports feeling well since returning home. No episodes of chest discomfort, palpitations of shortness of breath at the time of call. Thoroughly reviewed s/sx of CHF exacerbation and indications for seeking immediate medical attention.     Patient Goals/Self-Care Activities:  Take medications as prescribed Follow plan for CHF symptom management and notify provider with concerns Monitor and record weights Adhere to recommended cardiac diet Follow up for clinic evaluation as scheduled    Follow Up  Plan:  Will follow up within the next month     Patient Care Plan: Atrial Fibrillation (Adult)     Problem Identified: Dysrhythmia (Atrial Fibrillation)      Long-Range Goal: Heart Rate and Rhythm Monitored and Managed   Start Date: 12/05/2020  Expected End Date: 03/05/2021  Priority: High  Note:   Current Barriers:  Chronic Disease Management support and educational needs related to A-Fib.  Case Manager Clinical Goal(s):  Over the next 90 days, patient will not require hospitalization due to complications related to A-Fib.  Interventions:  Collaboration with [name]., MD regarding development and update of comprehensive plan of care as evidenced by provider attestation and co-signature Inter-disciplinary care team collaboration (see longitudinal plan of care) Reviewed medications and current treatment plan. Advised to continue taking medications as prescribed. Advised to notify the Cardiology team if unable to tolerate the prescribed regimen. Discussed symptoms since being in the Emergency Department on yesterday. Reports doing well at time of call. Denies chest discomfort, palpitations or shortness of breath. Advised to assess symptoms and monitor blood pressure and pulse daily. Discussed concerns regarding pending an elective dental procedure later this month. She will be required to stop taking blood thinners prior to her tooth extraction. She will require clearance from the PCP or the Cardiology team. Advised to reschedule the procedure for a later date given her recent symptoms and ED evaluation. Agreed to submit clearance request and reschedule the dental procedure for October or later. Reviewed ED discharge instructions and required follow up appointments. Thoroughly reviewed indications for seeking immediate medical attention.   Self-Care/Patient Goals: Take medications as prescribed Attend provider appointment as scheduled Assess symptoms daily Seek immediate  medical attention for worsening/unrelieved symptoms  Submit request/clearance form for dental procedure Contact the clinic with questions and new concerns if needed   Follow Up Plan:  Will follow up within the next month      Ms. Zurfluh verbalized understanding of the information discussed during the telephonic outreach. Declined need for mailed/printed instructions. A member of the care management team will follow up next month   [name] Care Management [facility] [phone]

## 2020-12-08 NOTE — Patient Instructions (Addendum)
Thank you for allowing the Chronic Care Management team to participate in your care.    Goal Addressed: Patient Care Plan: Heart Failure (Adult)     Problem Identified: Symptom Exacerbation (Heart Failure)   Priority: High  Onset Date: 04/15/2020     Long-Range Goal: Symptom Exacerbation Prevented or Minimized   Start Date: 09/09/2020  Expected End Date: 01/07/2021  Recent Progress: On track  Priority: High  Note:    Current Barriers:  Chronic Disease Management support and educational needs r/t CHF.  Case Manager Clinical Goal(s):  Over the next 120 days, patient will not require hospitalization or emergent evaluation d/t complications r/t CHF exacerbation.  Interventions:  Collaboration with [name]., MD regarding development and update of comprehensive plan of care as evidenced by provider attestation and co-signature Inter-disciplinary care team collaboration (see longitudinal plan of care) Reviewed medications and current plan for CHF management. Reports excellent compliance with treatment plan. Called today to reports increased episodes of chest discomfort and fatigue. Reports symptoms started over the weekend and have not resolved. Reports family advised her to go to Urgent Care on Sunday however she declined need for evaluation. Confirmed contacting her Cardiology team and PCP today. She will be evaluated by her primary care provider later today. Reviewed worsening symptoms r/t CHF exacerbation and indications for seeking immediate medical attention. She agreed to report to the Emergency Room or call 911 if her symptoms worsen prior to being evaluated in the clinic.    Patient Goals/Self-Care Activities:  Take medications as prescribed Follow plan for CHF symptom management and notify provider with concerns Monitor and record weights Adhere to recommended cardiac diet Follow up for clinic evaluation as scheduled    Follow Up Plan:  Will follow up within  the next two weeks      Ms. Garrigan verbalized understanding of the information discussed during the telephonic outreach. Declined need for mailed/printed instructions. A member of the care management team will follow up within the next two weeks.   [name] [facility] Care Management [facility] [phone]

## 2020-12-08 NOTE — Chronic Care Management (AMB) (Signed)
Chronic Care Management   CCM RN Visit Note   Name: Candace [name] MRN: [id number] DOB: 1950-06-16  Subjective: Candace [name] is a 70 y.o. year old female who is a primary care patient of [name]., MD. The care management team was consulted for assistance with disease management and care coordination needs.    Returned call to patient. She was in Emergency Room at the time of the call, being evaluated for unrelieved chest discomfort.   Consent to Services:  The patient was given information about Chronic Care Management services, agreed to services, and gave verbal consent prior to initiation of services.  Please see initial visit note for detailed documentation.     Assessment: Review of patient past medical history, allergies, medications, health status, including review of consultants reports, laboratory and other test data, was performed as part of comprehensive evaluation and provision of chronic care management services.   SDOH (Social Determinants of Health) assessments and interventions performed:  No  CCM Care Plan  Allergies  Allergen Reactions   Lisinopril Cough   Penicillins Swelling, Rash and Other (See Comments)    Did it involve swelling of the face/tongue/throat, SOB, or low BP? Yes Did it involve sudden or severe rash/hives, skin peeling, or any reaction on the inside of your mouth or nose? Yes Did you need to seek medical attention at a hospital or doctor's office? Yes When did it last happen?      15 years If all above answers are "NO", may proceed with cephalosporin use.     Outpatient Encounter Medications as of 12/04/2020  Medication Sig Note   acetaminophen (TYLENOL) 325 MG tablet Take 650 mg by mouth every 6 (six) hours as needed for moderate pain or headache.     bismuth subsalicylate (PEPTO BISMOL) 262 MG chewable tablet Chew 524 mg by mouth as needed.    clopidogrel (PLAVIX) 75 MG tablet Take 1 tablet (75 mg total) by mouth daily.     ELIQUIS 5 MG TABS tablet Take 5 mg by mouth 2 (two) times daily. 06/06/2020: Prescribed by Dr. Nehemiah Massed   ezetimibe (ZETIA) 10 MG tablet Take 1 tablet (10 mg total) by mouth daily.    insulin glargine (LANTUS SOLOSTAR) 100 UNIT/ML Solostar Pen Inject 10 Units into the skin at bedtime.    Insulin Pen Needle (NOVOFINE PLUS PEN NEEDLE) 32G X 4 MM MISC Use to inject insulin into the skin once daily.    isosorbide mononitrate (IMDUR) 120 MG 24 hr tablet Take 1 tablet (120 mg total) by mouth daily.    JARDIANCE 25 MG TABS tablet TAKE ONE TABLET BY MOUTH BEFORE BREAKFAST    Lancets (ONETOUCH DELICA PLUS 123XX123) MISC USE UP TO 4 TIMES DAILY AS DIRECTED    loperamide (IMODIUM) 2 MG capsule Take 2 mg by mouth as needed.    losartan (COZAAR) 25 MG tablet TAKE ONE TABLET BY MOUTH EVERYDAY AT BEDTIME    metoprolol tartrate (LOPRESSOR) 100 MG tablet Take 100 mg by mouth 2 (two) times daily. 06/06/2020: Prescribed by Dr. Nehemiah Massed    nitroGLYCERIN (NITROSTAT) 0.4 MG SL tablet Place 1 tablet (0.4 mg total) under the tongue every 5 (five) minutes as needed for chest pain.    nystatin (NYSTATIN) powder Apply 1 application topically daily. (Patient taking differently: Apply 1 application topically daily as needed.)    ondansetron (ZOFRAN-ODT) 8 MG disintegrating tablet Take 1 tablet (8 mg total) by mouth every 8 (eight) hours as needed.  pantoprazole (PROTONIX) 20 MG tablet Take 1 tablet (20 mg total) by mouth 2 (two) times daily.    rosuvastatin (CRESTOR) 40 MG tablet Take 1 tablet (40 mg total) by mouth daily.    sertraline (ZOLOFT) 50 MG tablet Take 1 tablet (50 mg total) by mouth daily.    TRULICITY 1.5 0000000 SOPN Inject 1.5 mg into the skin once a week.    No facility-administered encounter medications on file as of 12/04/2020.    Patient Active Problem List   Diagnosis Date Noted   Vertigo    Unsteady gait    Acute bronchitis 03/07/2019   Esophageal dysphagia    Stomach irritation    Gastric polyp     Esophageal lump    Hx of colonic polyp    Polyp of colon    Diverticulosis of large intestine without diverticulitis    PSVT (paroxysmal supraventricular tachycardia) ([place]) 10/03/2018   Abnormal ECG 07/05/2018   Tachycardia 03/27/2018   Pain in limb 02/28/2018   Chronic diastolic CHF (congestive heart failure) ([place]) 11/03/2017   TIA (transient ischemic attack) 11/03/2017   COPD suggested by initial evaluation ([place]) 08/23/2017   Acute and chronic respiratory failure with hypoxia ([place]) 08/23/2017   Postoperative state 08/15/2017   Incidental pulmonary nodule 08/01/2017   Non-ST elevation myocardial infarction (NSTEMI), subendocardial infarction, subsequent episode of care ([place]) 06/01/2017   B12 deficiency 12/01/2016   Vitamin D deficiency 11/04/2016   Depression with anxiety 09/30/2016   AF (paroxysmal atrial fibrillation) ([place]) 09/30/2016   Resting tremor 09/30/2016   Mild obstructive sleep apnea 09/30/2016   Headache 08/18/2016   Unstable angina ([place]) 08/03/2016   Type 2 diabetes mellitus with complication, without long-term current use of insulin ([place]) 08/03/2016   Chronic daily headache 05/05/2016   Asthma 11/11/2015   S/P CABG x 4 11/10/2015   Coronary artery disease 11/07/2015   Carotid artery narrowing 02/08/2014   Chest pain 02/08/2014   Mixed hyperlipidemia 02/08/2014   Bilateral carotid artery stenosis 02/08/2014   Atypical chest pain 02/07/2014   Essential hypertension 02/07/2014   Awareness of heartbeats 02/07/2014   D (diarrhea) 10/05/2013   Gastroesophageal reflux disease 10/05/2013     Returned call to patient. Reports being in the Emergency Department at the time of the call. Being evaluated for unrelieved chest discomfort. She does not anticipate admission and reports she will likely return home today. Reports pending lab results and final assessment by the ED provider. A family member was available during the call. Confirmed that family will be available to  assist when she is discharged today. Agreed to follow up with the care management team within the next 24 hours.   [name] Care Management [facility] [phone]

## 2020-12-08 NOTE — Patient Instructions (Signed)
  Patient Care Plan: Heart Failure (Adult)     Problem Identified: Symptom Exacerbation (Heart Failure)   Priority: High  Onset Date: 04/15/2020     Long-Range Goal: Symptom Exacerbation Prevented or Minimized   Start Date: 09/09/2020  Expected End Date: 01/07/2021  Recent Progress: On track  Priority: High  Note:    Current Barriers:  Chronic Disease Management support and educational needs r/t CHF.  Case Manager Clinical Goal(s):  Over the next 120 days, patient will not require hospitalization or emergent evaluation d/t complications r/t CHF exacerbation.  Interventions:  Collaboration with [name]., MD regarding development and update of comprehensive plan of care as evidenced by provider attestation and co-signature Inter-disciplinary care team collaboration (see longitudinal plan of care) Reviewed medications and current plan for CHF management. Reports excellent compliance with treatment plan. Denies edema or s/sx of fluid overload. Denies changes in activity tolerance but reports slight change in mobility due to discomfort in her toe. Reviewed worsening symptoms r/t CHF exacerbation and indications for seeking immediate medical attention.  Requested assistance with ensuring all medications can be obtained in [place]. She anticipates being out of the area towards the end of the month. Anticipates returning to [facility] for medical appointments as scheduled. Requested assistance with ensuring all medications are able to be filled for several months or obtained at a pharmacy in [place]. [facility].    Patient Goals/Self-Care Activities:  Take medications as prescribed Follow plan for CHF symptom management and notify provider with concerns Monitor and record weights Adhere to recommended cardiac diet Notify provider or care management team with questions or new concerns as needed    Follow Up Plan:  Will follow up  next month       Candace [name] verbalized understanding of the information discussed during the telephonic outreach. Declined need for mailed/printed instructions. Pending follow-up from Pharmacy regarding medications. A member of the care management team will follow up with Candace [name] within the next month.   [name] [facility] Care Management [facility] [phone]

## 2020-12-10 ENCOUNTER — Ambulatory Visit: Payer: Self-pay

## 2020-12-10 DIAGNOSIS — I5032 Chronic diastolic (congestive) heart failure: Secondary | ICD-10-CM

## 2020-12-10 DIAGNOSIS — E1142 Type 2 diabetes mellitus with diabetic polyneuropathy: Secondary | ICD-10-CM

## 2020-12-10 NOTE — Patient Instructions (Addendum)
Thank you for allowing the Chronic Care Management team to participate in your care.    Patient Care Plan: Diabetes Type 2 (Adult)     Problem Identified: Glycemic Management (Diabetes, Type 2)   Priority: High  Onset Date: 04/15/2020     Long-Range Goal: Glycemic Management Optimized   Start Date: 11/21/2020  Expected End Date: 03/21/2021  Recent Progress: On track  Priority: High  Note:    Current Barriers:  Chronic Disease Management support and educational needs r/t Diabetes self management.   Case Manager Clinical Goal(s):  Over the next 120 days, patient will demonstrate improved adherence to prescribed treatment plan for Diabetes self management as evidenced by daily monitoring and recording of CBG, adherence to ADA/ carb modified diet and adherence to prescribed medication regimen.  Interventions:  Collaboration with Miguel Aschoff, MD regarding development and update of comprehensive plan of care as evidenced by provider attestation and co-signature Inter-disciplinary care team collaboration (see longitudinal plan of care) Reviewed recent blood glucose readings and compliance with current treatment plan. Reports taking medications as prescribed. She is still waiting to receive a Lantus/Solostar insulin pen. The insulin pen is currently on backorder at there local pharmacy. Reports being instructed that it should be available within the week. Message forwarded to the [place] team. She agreed to call if the device remains on backorder and the script needs to be sent to another pharmacy. Discussed importance of consistent blood glucose monitoring. She is currently pending receipt of her Onetouch Delica test strips. Advised to monitor and record readings once supplies are received. Reviewed s/sx of hypoglycemia and hyperglycemia along with recommended interventions.    Patient Goals/Self-Care Activities Self administer medications as prescribed Attend all scheduled  provider appointments Monitor blood glucose levels consistently and utilize recommended interventions Monitor closely for s/sx of hyperglycemia and seek medical attention if needed Adhere to prescribed ADA/carb modified Update the CCM Pharmacy if unable to obtain Lantus/Solostar insulin pen Notify provider or care management team with questions and new concerns as needed.   Follow Up Plan:  Will follow up within the next month    Patient Care Plan: Heart Failure (Adult)     Problem Identified: Symptom Exacerbation (Heart Failure)   Priority: High  Onset Date: 04/15/2020     Long-Range Goal: Symptom Exacerbation Prevented or Minimized   Start Date: 09/09/2020  Expected End Date: 01/07/2021  Recent Progress: On track  Priority: High  Note:    Current Barriers:  Chronic Disease Management support and educational needs r/t CHF.  Case Manager Clinical Goal(s):  Over the next 120 days, patient will not require hospitalization or emergent evaluation d/t complications r/t CHF exacerbation.  Interventions:  Collaboration with [name]., MD regarding development and update of comprehensive plan of care as evidenced by provider attestation and co-signature Inter-disciplinary care team collaboration (see longitudinal plan of care) Reviewed medications and current plan for CHF management. Reports compliance with medications and nutritional intake. Report doing well with monitoring sodium and nutritional intake.  Reviewed morning weights. Reports weights have remained stable. Weight was 162 lbs today. Reviewed weight parameters and indications for notifying a provider for weight gain greater than 3 lbs overnight or 5 lbs within a week. Denies episodes of chest pain, palpitations or shortness of breath. No changes in activity tolerance. Reviewed s/sx of complications r/t CHF exacerbation and indications for seeking immediate medical attention.   Patient Goals/Self-Care  Activities:  Take medications as prescribed Attend medical appointments as scheduled Follow  plan for CHF symptom management and notify provider with concerns Monitor and record weights Adhere to recommended cardiac diet    Follow Up Plan:  Will follow up within the next month     Ms. Weeda verbalized understanding of the information discussed during telephonic outreach. Declined need for mailed/printed instructions. A member of the care management team will follow up within the next month.   [name] [facility] Care Management [facility] [phone]

## 2020-12-10 NOTE — Chronic Care Management (AMB) (Signed)
Chronic Care Management   CCM RN Visit Note  12/10/2020 Name: Candace [name] MRN: [id number] DOB: 10-16-1950  Subjective: Candace [name] is a 70 y.o. year old female who is a primary care patient of Candace [name]., MD. The care management team was consulted for assistance with disease management and care coordination needs.    Medications  Engaged with patient by telephone for follow up visit in response to provider referral for case management and care coordination services.   Consent to Services:  The patient was given information about Chronic Care Management services, agreed to services, and gave verbal consent prior to initiation of services.  Please see initial visit note for detailed documentation.    Assessment: Review of patient past medical history, allergies, medications, health status, including review of consultants reports, laboratory and other test data, was performed as part of comprehensive evaluation and provision of chronic care management services.   SDOH (Social Determinants of Health) assessments and interventions performed:  No  CCM Care Plan  Allergies  Allergen Reactions   Lisinopril Cough   Penicillins Swelling, Rash and Other (See Comments)    Did it involve swelling of the face/tongue/throat, SOB, or low BP? Yes Did it involve sudden or severe rash/hives, skin peeling, or any reaction on the inside of your mouth or nose? Yes Did you need to seek medical attention at a hospital or doctor's office? Yes When did it last happen?      15 years If all above answers are "NO", may proceed with cephalosporin use.     Outpatient Encounter Medications as of 12/10/2020  Medication Sig Note   acetaminophen (TYLENOL) 325 MG tablet Take 650 mg by mouth every 6 (six) hours as needed for moderate pain or headache.     bismuth subsalicylate (PEPTO BISMOL) 262 MG chewable tablet Chew 524 mg by mouth as needed.    clopidogrel (PLAVIX) 75 MG tablet Take 1 tablet  (75 mg total) by mouth daily.    ELIQUIS 5 MG TABS tablet Take 5 mg by mouth 2 (two) times daily. 06/06/2020: Prescribed by Dr. Nehemiah Massed   ezetimibe (ZETIA) 10 MG tablet Take 1 tablet (10 mg total) by mouth daily.    furosemide (LASIX) 20 MG tablet Take 20 mg by mouth.    insulin glargine (LANTUS SOLOSTAR) 100 UNIT/ML Solostar Pen Inject 10 Units into the skin at bedtime.    Insulin Pen Needle (NOVOFINE PLUS PEN NEEDLE) 32G X 4 MM MISC Use to inject insulin into the skin once daily.    isosorbide mononitrate (IMDUR) 120 MG 24 hr tablet Take 1 tablet (120 mg total) by mouth daily.    JARDIANCE 25 MG TABS tablet TAKE ONE TABLET BY MOUTH BEFORE BREAKFAST    Lancets (ONETOUCH DELICA PLUS 123XX123) MISC USE UP TO 4 TIMES DAILY AS DIRECTED    loperamide (IMODIUM) 2 MG capsule Take 2 mg by mouth as needed.    losartan (COZAAR) 25 MG tablet TAKE ONE TABLET BY MOUTH EVERYDAY AT BEDTIME    metoprolol tartrate (LOPRESSOR) 100 MG tablet Take 100 mg by mouth 2 (two) times daily. 06/06/2020: Prescribed by Dr. Nehemiah Massed    nitroGLYCERIN (NITROSTAT) 0.4 MG SL tablet Place 1 tablet (0.4 mg total) under the tongue every 5 (five) minutes as needed for chest pain.    nystatin (NYSTATIN) powder Apply 1 application topically daily. (Patient taking differently: Apply 1 application topically daily as needed.)    ondansetron (ZOFRAN-ODT) 8 MG disintegrating tablet Take 1  tablet (8 mg total) by mouth every 8 (eight) hours as needed.    pantoprazole (PROTONIX) 20 MG tablet Take 1 tablet (20 mg total) by mouth 2 (two) times daily.    rosuvastatin (CRESTOR) 40 MG tablet Take 1 tablet (40 mg total) by mouth daily.    sertraline (ZOLOFT) 50 MG tablet Take 1 tablet (50 mg total) by mouth daily.    TRULICITY 1.5 0000000 SOPN Inject 1.5 mg into the skin once a week.    [DISCONTINUED] cetirizine (ZYRTEC) 5 MG tablet Take 2 tablets (10 mg total) by mouth daily.    No facility-administered encounter medications on file as of  12/10/2020.    Patient Active Problem List   Diagnosis Date Noted   Vertigo    Unsteady gait    Acute bronchitis 03/07/2019   Esophageal dysphagia    Stomach irritation    Gastric polyp    Esophageal lump    Hx of colonic polyp    Polyp of colon    Diverticulosis of large intestine without diverticulitis    PSVT (paroxysmal supraventricular tachycardia) ([place]) 10/03/2018   Abnormal ECG 07/05/2018   Tachycardia 03/27/2018   Pain in limb 02/28/2018   Chronic diastolic CHF (congestive heart failure) ([place]) 11/03/2017   TIA (transient ischemic attack) 11/03/2017   COPD suggested by initial evaluation ([place]) 08/23/2017   Acute and chronic respiratory failure with hypoxia ([place]) 08/23/2017   Postoperative state 08/15/2017   Incidental pulmonary nodule 08/01/2017   Non-ST elevation myocardial infarction (NSTEMI), subendocardial infarction, subsequent episode of care ([place]) 06/01/2017   B12 deficiency 12/01/2016   Vitamin D deficiency 11/04/2016   Depression with anxiety 09/30/2016   AF (paroxysmal atrial fibrillation) ([place]) 09/30/2016   Resting tremor 09/30/2016   Mild obstructive sleep apnea 09/30/2016   Headache 08/18/2016   Unstable angina ([place]) 08/03/2016   Type 2 diabetes mellitus with complication, without long-term current use of insulin ([place]) 08/03/2016   Chronic daily headache 05/05/2016   Asthma 11/11/2015   S/P CABG x 4 11/10/2015   Coronary artery disease 11/07/2015   Carotid artery narrowing 02/08/2014   Chest pain 02/08/2014   Mixed hyperlipidemia 02/08/2014   Bilateral carotid artery stenosis 02/08/2014   Atypical chest pain 02/07/2014   Essential hypertension 02/07/2014   Awareness of heartbeats 02/07/2014   D (diarrhea) 10/05/2013   Gastroesophageal reflux disease 10/05/2013    Conditions to be addressed/monitored:CHF and DMII Patient Care Plan: Diabetes Type 2 (Adult)     Problem Identified: Glycemic Management (Diabetes, Type 2)   Priority: High  Onset  Date: 04/15/2020     Long-Range Goal: Glycemic Management Optimized   Start Date: 11/21/2020  Expected End Date: 03/21/2021  Recent Progress: On track  Priority: High  Note:    Current Barriers:  Chronic Disease Management support and educational needs r/t Diabetes self management.   Case Manager Clinical Goal(s):  Over the next 120 days, patient will demonstrate improved adherence to prescribed treatment plan for Diabetes self management as evidenced by daily monitoring and recording of CBG, adherence to ADA/ carb modified diet and adherence to prescribed medication regimen.  Interventions:  Collaboration with Miguel Aschoff, MD regarding development and update of comprehensive plan of care as evidenced by provider attestation and co-signature Inter-disciplinary care team collaboration (see longitudinal plan of care) Reviewed recent blood glucose readings and compliance with current treatment plan. Reports taking medications as prescribed. She is still waiting to receive a Lantus/Solostar insulin pen. The insulin pen is currently on backorder at there  local pharmacy. Reports being instructed that it should be available within the week. Message forwarded to the [place] team. She agreed to call if the device remains on backorder and the script needs to be sent to another pharmacy. Discussed importance of consistent blood glucose monitoring. She is currently pending receipt of her Onetouch Delica test strips. Advised to monitor and record readings once supplies are received. Reviewed s/sx of hypoglycemia and hyperglycemia along with recommended interventions.    Patient Goals/Self-Care Activities Self administer medications as prescribed Attend all scheduled provider appointments Monitor blood glucose levels consistently and utilize recommended interventions Monitor closely for s/sx of hyperglycemia and seek medical attention if needed Adhere to prescribed ADA/carb modified Update  the CCM Pharmacy if unable to obtain Lantus/Solostar insulin pen Notify provider or care management team with questions and new concerns as needed.   Follow Up Plan:  Will follow up within the next month    Patient Care Plan: Heart Failure (Adult)     Problem Identified: Symptom Exacerbation (Heart Failure)   Priority: High  Onset Date: 04/15/2020     Long-Range Goal: Symptom Exacerbation Prevented or Minimized   Start Date: 09/09/2020  Expected End Date: 01/07/2021  Recent Progress: On track  Priority: High  Note:    Current Barriers:  Chronic Disease Management support and educational needs r/t CHF.  Case Manager Clinical Goal(s):  Over the next 120 days, patient will not require hospitalization or emergent evaluation d/t complications r/t CHF exacerbation.  Interventions:  Collaboration with Candace [name]., MD regarding development and update of comprehensive plan of care as evidenced by provider attestation and co-signature Inter-disciplinary care team collaboration (see longitudinal plan of care) Reviewed medications and current plan for CHF management. Reports compliance with medications and nutritional intake. Report doing well with monitoring sodium and nutritional intake.  Reviewed morning weights. Reports weights have remained stable. Weight was 162 lbs today. Reviewed weight parameters and indications for notifying a provider for weight gain greater than 3 lbs overnight or 5 lbs within a week. Denies episodes of chest pain, palpitations or shortness of breath. No changes in activity tolerance. Reviewed s/sx of complications r/t CHF exacerbation and indications for seeking immediate medical attention.   Patient Goals/Self-Care Activities:  Take medications as prescribed Attend medical appointments as scheduled Follow plan for CHF symptom management and notify provider with concerns Monitor and record weights Adhere to recommended cardiac diet    Follow Up  Plan:  Will follow up within the next month     PLAN A member of the care management team will follow up within the next month.   [name] [facility] Care Management [facility] [phone]

## 2020-12-11 ENCOUNTER — Telehealth: Payer: Self-pay

## 2020-12-11 DIAGNOSIS — E118 Type 2 diabetes mellitus with unspecified complications: Secondary | ICD-10-CM

## 2020-12-11 MED ORDER — GLUCOSE BLOOD VI STRP
ORAL_STRIP | 12 refills | Status: DC
Start: 2020-12-11 — End: 2020-12-23

## 2020-12-11 NOTE — Progress Notes (Signed)
APPOINTMENT REMINDER   Called [name], No answer, left message of appointment on 12/12/2020 at 10:00 am via telephone visit with Junius Argyle, Pharm D. Notified to have all medications, supplements, blood pressure and  blood sugar logs available during appointment and to return call if need to reschedule.  Care Gaps: COVID 19 Vaccine Ophthalmology Exam Tetanus Shingrix Foot Exam Influenza Vaccine  Star Rating Drugs: 06/18/2020 Atorvastatin 40 mg last filled for a 30-day supply at [facility] 11/18/2020 Rosuvastatin 40 mg last filled for a 90- Day supply at [facility] 11/18/2020 Losartan 25 mg last filled for a 30-Day supply at [facility] 11/18/2020 Jardiance 25 mg last filled for a 10-Day Supply at [facility] [id number] Trulicity 1.5 mg last filled for a 30-Day supply at [facility]: None ID   [place] Pharmacist Assistant [phone]

## 2020-12-11 NOTE — Telephone Encounter (Signed)
-----   Message from Neldon Labella, RN sent at 12/10/2020  2:40 PM EDT ----- Regarding: Medication Update and Request for Test Strips Good Afternoon!  Candace [name] wanted me to relay the following:  -She needs test strips. She is currently using a Onetouch Delica Plus. She requested that the order be submitted to [facility] at [address] in [place]. Store [phone].  -She has not started the evening insulin. She has been unable to obtain the  Lantus Solostar Pen due to it being on backorder at the [facility]. She was informed by the Pharmacy tech that it should be available at the end of the week.  Thank You

## 2020-12-12 ENCOUNTER — Ambulatory Visit: Payer: Medicare Other

## 2020-12-12 DIAGNOSIS — E1142 Type 2 diabetes mellitus with diabetic polyneuropathy: Secondary | ICD-10-CM

## 2020-12-12 DIAGNOSIS — F4320 Adjustment disorder, unspecified: Secondary | ICD-10-CM

## 2020-12-12 MED ORDER — INSULIN PEN NEEDLE 32G X 4 MM MISC: 1 refills | Status: DC

## 2020-12-12 MED ORDER — SERTRALINE HCL 100 MG PO TABS: 100.0000 mg | ORAL_TABLET | Freq: Every day | ORAL | 1 refills | Status: DC

## 2020-12-12 NOTE — Progress Notes (Signed)
Chronic Care Management Pharmacy Note  12/12/2020 Name:  Candace [name] MRN:  [id number] DOB:  1950/08/01  Summary:  -Has not been able to pick up Lantus due to her pen needles not being available at the pharmacy. New pen needle Rx was sent in today, patient to pick up later this afternoon.  -Patient reports Dr. Rosanna Randy instructed her to increase sertraline to 100 mg daily, new Rx never sent in.   Recommendations/Changes made from today's visit: START Lantus 10 units nightly  INCREASE sertraline to 100 mg daily as per Dr. Rosanna Randy last PCP visit.   Plan: Health Concierge follow-up in one week to assess Lantus tolerability.  CPP Follow-up in 3 weeks   Subjective: Candace [name] is an 70 y.o. year old female who is a primary patient of [name]., MD.  The CCM team was consulted for assistance with disease management and care coordination needs.    Engaged with patient by telephone for follow up visit in response to provider referral for pharmacy case management and/or care coordination services.   Consent to Services:  The patient was given information about Chronic Care Management services, agreed to services, and gave verbal consent prior to initiation of services.  Please see initial visit note for detailed documentation.   Patient Care Team: [name]., MD as PCP - General (Family Medicine) Corey Skains, MD as Consulting Physician (Cardiology) Sharlotte Alamo, DPM (Podiatry) Neldon Labella, RN as Case Manager Virgel Manifold, MD as Consulting Physician (Gastroenterology) [place], [place], Glean Salvo, [facility] as Pharmacist (Pharmacist)  Recent office visits: 12/02/20: Patient presented to Dr. Rosanna Randy for follow-up. Sertralien 100 mg.  08/11/20: Patient presented to Dr. Rosanna Randy for follow-up. A1c improved to 9.1%. Patient started on losartan 25 mg daily.  05/13/20: Patient presented to Dr. Rosanna Randy for viral URI. Patient started on  benzonatate.  05/01/20: Patient presented to Dr. Rosanna Randy for follow-up. A1c 12.2%. Patient started on Trulicity 0.86 mg weekly. Carafate  04/02/20: Patient presented to [facility], LPN for AWV.   Recent consult visits: 08/07/20: Patient presented to Dr. Nehemiah Massed (Cardiology) for follow-up. No medication changes noted.  07/07/20: Patient presented to Dr. Nehemiah Massed (Cardiology) for follow-up. No medication changes noted. 02/27/21: Patient presented to Hassell Done, NP. Patient started on Eliquis 5 mg twice daily for A-Fib. Non-adherence to atorvastatin   Hospital visits: 07/09/20: Patient hospitalized for chest pain. Aspirin stopped. Clopidogrel 7.60m daily, Ezetimibe 10 mg daily started. 3/22-3/23: Patient hospitalized for chest pain. Demand ischemia, possible pericarditis. Patient started on aspirin 81 mg daily, colchicine 0.6 mg twice daily   Objective:  Lab Results  Component Value Date   CREATININE 0.65 12/04/2020   BUN 9 12/04/2020   GFRNONAA >60 12/04/2020   GFRAA 108 02/06/2020   NA 139 12/04/2020   K 4.1 12/04/2020   CALCIUM 9.2 12/04/2020   CO2 25 12/04/2020    Lab Results  Component Value Date/Time   HGBA1C 10.7 (H) 12/02/2020 04:06 PM   HGBA1C 9.1 (H) 08/11/2020 09:32 AM   HGBA1C 9.2 07/10/2019 12:00 AM   HGBA1C 8.3 (H) 07/14/2013 05:42 AM   MICROALBUR 50 08/11/2020 08:51 AM    Last diabetic Eye exam:  Lab Results  Component Value Date/Time   HMDIABEYEEXA No Retinopathy 06/28/2019 12:00 AM    Last diabetic Foot exam: No results found for: HMDIABFOOTEX   Lab Results  Component Value Date   CHOL 164 08/11/2020   HDL 57 08/11/2020   LDLCALC 90  08/11/2020   TRIG 94 08/11/2020   CHOLHDL 2.9 08/11/2020    Hepatic Function Latest Ref Rng & Units 12/02/2020 10/20/2020 10/04/2020  Total Protein 6.0 - 8.5 g/dL 6.2 6.3 6.9  Albumin 3.8 - 4.8 g/dL 4.0 4.3 3.8  AST 0 - 40 IU/L _0 ALT 0 - 32 IU/L _1 Alk Phosphatase 44 - 121 IU/L 108 122(H) 107  Total  Bilirubin 0.0 - 1.2 mg/dL 0.2 0.2 0.5  Bilirubin, Direct 0.0 - 0.2 mg/dL - - <0.1    Lab Results  Component Value Date/Time   TSH 1.180 01/03/2019 09:57 AM   TSH 1.262 09/14/2018 05:04 PM   TSH 1.661 12/23/2016 07:03 PM   TSH 0.818 10/01/2016 09:35 AM    CBC Latest Ref Rng & Units 12/04/2020 12/02/2020 10/20/2020  WBC 4.0 - 10.5 K/uL 6.1 6.8 7.0  Hemoglobin 12.0 - 15.0 g/dL 12.3 12.4 13.2  Hematocrit 36.0 - 46.0 % 36.9 37.1 38.6  Platelets 150 - 400 K/uL 185 189 191    Lab Results  Component Value Date/Time   VD25OH 12.2 (L) 10/01/2016 09:35 AM    Clinical ASCVD: Yes  The ASCVD Risk score (Arnett DK, et al., 2019) failed to calculate for the following reasons:   The patient has a prior MI or stroke diagnosis    Depression screen [facility] 2/9 10/29/2020 08/05/2020 04/15/2020  Decreased Interest 0 1 0  Down, Depressed, Hopeless 0 1 0  PHQ - 2 Score 0 2 0  Altered sleeping 1 1 -  Tired, decreased energy 1 1 -  Change in appetite 0 1 -  Feeling bad or failure about yourself  0 0 -  Trouble concentrating 0 0 -  Moving slowly or fidgety/restless 1 0 -  Suicidal thoughts 0 0 -  PHQ-9 Score 3 5 -  Difficult doing work/chores Not difficult at all Not difficult at all -  Some recent data might be hidden    Social History   Tobacco Use  Smoking Status Former   Packs/day: 0.50   Types: Cigarettes   Quit date: 10/07/2001   Years since quitting: 19.1  Smokeless Tobacco Never   BP Readings from Last 3 Encounters:  12/04/20 132/66  12/02/20 96/62  10/05/20 135/80   Pulse Readings from Last 3 Encounters:  12/04/20 75  12/02/20 90  10/05/20 80   Wt Readings from Last 3 Encounters:  10/04/20 167 lb (75.8 kg)  09/24/20 167 lb (75.8 kg)  08/11/20 169 lb (76.7 kg)    Assessment/Interventions: Review of patient past medical history, allergies, medications, health status, including review of consultants reports, laboratory and other test data, was performed as part of comprehensive  evaluation and provision of chronic care management services.   SDOH:  (Social Determinants of Health) assessments and interventions performed: Yes SDOH Interventions    Flowsheet Row Most Recent Value  SDOH Interventions   Financial Strain Interventions Intervention Not Indicated          CCM Care Plan  Allergies  Allergen Reactions   Lisinopril Cough   Penicillins Swelling, Rash and Other (See Comments)    Did it involve swelling of the face/tongue/throat, SOB, or low BP? Yes Did it involve sudden or severe rash/hives, skin peeling, or any reaction on the inside of your mouth or nose? Yes Did you need to seek medical attention at a hospital or doctor's office? Yes When did it last happen?      15 years If all above  answers are "NO", may proceed with cephalosporin use.     Medications Reviewed Today     Reviewed by Neldon Labella, RN (Registered Nurse) on 12/10/20 at 1335  Med List Status: <None>   Medication Order Taking? Sig Documenting Provider Last Dose Status Informant  acetaminophen (TYLENOL) 325 MG tablet [id number] No Take 650 mg by mouth every 6 (six) hours as needed for moderate pain or headache.  [provider] Taking Active Self  bismuth subsalicylate (PEPTO BISMOL) 262 MG chewable tablet [id number] No Chew 524 mg by mouth as needed. [provider] Taking Active Self  Discontinued 03/06/19 1830   clopidogrel (PLAVIX) 75 MG tablet [id number]  Take 1 tablet (75 mg total) by mouth daily. [name]., MD  Active   ELIQUIS 5 MG TABS tablet [id number] No Take 5 mg by mouth 2 (two) times daily. [provider] Taking Active Self           Med Note Michaelle Birks, Glean Salvo   Fri Jun 06, 2020 11:56 AM) Prescribed by Dr. Nehemiah Massed  ezetimibe (ZETIA) 10 MG tablet [id number]  Take 1 tablet (10 mg total) by mouth daily. [name]., MD  Active   furosemide (LASIX) 20 MG tablet [id number]  Take 20 mg by mouth. [provider]   Active Self  insulin glargine (LANTUS SOLOSTAR) 100 UNIT/ML Solostar Pen [id number]  Inject 10 Units into the skin at bedtime. [name]., MD  Active   Insulin Pen Needle (NOVOFINE PLUS PEN NEEDLE) 32G X 4 MM MISC [id number]  Use to inject insulin into the skin once daily. [name]., MD  Active   isosorbide mononitrate (IMDUR) 120 MG 24 hr tablet [id number] No Take 1 tablet (120 mg total) by mouth daily. [name]., MD Taking Active   JARDIANCE 25 MG TABS tablet [id number] No TAKE ONE TABLET BY MOUTH BEFORE BREAKFAST [name]., MD Taking Active   Lancets (ONETOUCH DELICA PLUS [id number]) [place] [id number] No USE UP TO 4 TIMES DAILY AS DIRECTED [name]., MD Taking Active Self  loperamide (IMODIUM) 2 MG capsule [id number] No Take 2 mg by mouth as needed. [provider] Taking Active Self  losartan (COZAAR) 25 MG tablet [id number] No TAKE ONE TABLET BY MOUTH EVERYDAY AT BEDTIME [name]., MD Taking Active   metoprolol tartrate (LOPRESSOR) 100 MG tablet [id number] No Take 100 mg by mouth 2 (two) times daily. [provider] Taking Active Self           Med Note Michaelle Birks, Glean Salvo   Fri Jun 06, 2020 11:56 AM) Prescribed by Dr. Nehemiah Massed   nitroGLYCERIN (NITROSTAT) 0.4 MG SL tablet [id number] No Place 1 tablet (0.4 mg total) under the tongue every 5 (five) minutes as needed for chest pain. Bettey Costa, MD Taking Active Self           Med Note Johny Drilling Mar 27, 2018  2:39 PM)    nystatin (NYSTATIN) powder [id number] No Apply 1 application topically daily.  Patient taking differently: Apply 1 application topically daily as needed.   Rubie Maid, MD Taking Active   ondansetron (ZOFRAN-ODT) 8 MG disintegrating tablet [id number] No Take 1 tablet (8 mg total) by mouth every 8 (eight) hours as needed. [name]., MD Taking Active   pantoprazole (PROTONIX) 20 MG tablet [id number] No Take 1 tablet (20 mg  total) by mouth 2 (two) times daily. Rosanna Randy,  Retia Passe., MD Taking Active Self  rosuvastatin (CRESTOR) 40 MG tablet [id number]  Take 1 tablet (40 mg total) by mouth daily. [name]., MD  Active   sertraline (ZOLOFT) 50 MG tablet [id number] No Take 1 tablet (50 mg total) by mouth daily. [name]., MD Taking Active   TRULICITY 1.5 PJ/0.9TO Bon[place] Aid [id number] No Inject 1.5 mg into the skin once a week. [name]., MD Taking Active             Patient Active Problem List   Diagnosis Date Noted   Vertigo    Unsteady gait    Acute bronchitis 03/07/2019   Esophageal dysphagia    Stomach irritation    Gastric polyp    Esophageal lump    Hx of colonic polyp    Polyp of colon    Diverticulosis of large intestine without diverticulitis    PSVT (paroxysmal supraventricular tachycardia) ([place]) 10/03/2018   Abnormal ECG 07/05/2018   Tachycardia 03/27/2018   Pain in limb 02/28/2018   Chronic diastolic CHF (congestive heart failure) ([place]) 11/03/2017   TIA (transient ischemic attack) 11/03/2017   COPD suggested by initial evaluation ([place]) 08/23/2017   Acute and chronic respiratory failure with hypoxia ([place]) 08/23/2017   Postoperative state 08/15/2017   Incidental pulmonary nodule 08/01/2017   Non-ST elevation myocardial infarction (NSTEMI), subendocardial infarction, subsequent episode of care ([place]) 06/01/2017   B12 deficiency 12/01/2016   Vitamin D deficiency 11/04/2016   Depression with anxiety 09/30/2016   AF (paroxysmal atrial fibrillation) ([place]) 09/30/2016   Resting tremor 09/30/2016   Mild obstructive sleep apnea 09/30/2016   Headache 08/18/2016   Unstable angina ([place]) 08/03/2016   Type 2 diabetes mellitus with complication, without long-term current use of insulin ([place]) 08/03/2016   Chronic daily headache 05/05/2016   Asthma 11/11/2015   S/P CABG x 4 11/10/2015   Coronary artery disease 11/07/2015   Carotid artery narrowing 02/08/2014   Chest  pain 02/08/2014   Mixed hyperlipidemia 02/08/2014   Bilateral carotid artery stenosis 02/08/2014   Atypical chest pain 02/07/2014   Essential hypertension 02/07/2014   Awareness of heartbeats 02/07/2014   D (diarrhea) 10/05/2013   Gastroesophageal reflux disease 10/05/2013    Immunization History  Administered Date(s) Administered   Fluad Quad(high Dose 65+) 12/28/2018   Influenza, High Dose Seasonal PF 12/24/2016, 12/08/2017   Influenza,inj,Quad PF,6+ Mos 12/19/2015, 01/18/2020   Influenza-Unspecified 03/06/2015   Moderna Sars-Covid-2 Vaccination 04/14/2019, 05/12/2019, 01/28/2020   Pneumococcal Conjugate-13 03/16/2018   Pneumococcal Polysaccharide-23 12/24/2016   Tdap 07/28/2010    Conditions to be addressed/monitored:  Hypertension, Hyperlipidemia, Diabetes, Atrial Fibrillation, Heart Failure, Coronary Artery Disease, GERD, Asthma, Depression and Anxiety  Care Plan : General Pharmacy (Adult)  Updates made by Germaine Pomfret, RPH since 12/12/2020 12:00 AM     Problem: Hypertension, Hyperlipidemia, Diabetes, Atrial Fibrillation, Heart Failure, Coronary Artery Disease, GERD, Asthma, Depression and Anxiety   Priority: High     Long-Range Goal: Patient-Specific Goal   Start Date: 06/06/2020  Expected End Date: 11/25/2021  This Visit's Progress: On track  Recent Progress: On track  Priority: High  Note:   Current Barriers:  Unable to achieve control of diabetes  Unable to achieve control of cholesterol Does not adhere to prescribed medication regimen  Pharmacist Clinical Goal(s):  Over the next 90 days, patient will achieve control of diabetes as evidenced by A1c less than 8% patient will achieve control of cholesterol as evidenced by LDL less than 70  adhere to prescribed medication regimen as evidenced by utilization of enhanced pharmacy services through collaboration with PharmD and provider.   Interventions: 1:1 collaboration with [name]., MD  regarding development and update of comprehensive plan of care as evidenced by provider attestation and co-signature Inter-disciplinary care team collaboration (see longitudinal plan of care) Comprehensive medication review performed; medication list updated in electronic medical record  Diabetes (A1c goal <7%) -Uncontrolled -Diagnosed 2010 -Current medications: Trulicity 1.5 mg weekly on Tuesdays   Jardiance 25 mg daily  Lantus 10 units nightly (has not started) -Medications previously tried: Glipizide, Lantus, Tradjenta, Metformin, Metformin XR, Ozempic   -Current home glucose readings:    Fasting  30-Aug 273  29-Aug 187  28-Aug 216  27-Aug 171  26-Aug 187  25-Aug 197  24-Aug 203  23-Aug 217  22-Aug 203  21-Aug 245  Average 210    -Denies hypoglycemic/hyperglycemic symptoms -Current meal patterns: trying to eat more salads, water. Limiting carbohydrates when possible. 24 hour recall:  breakfast: Yogurt + Banana  lunch: Broccoli + Grapes (handful)  dinner: Mac&Cheese  + Broccoli + BBQ Ribs  snacks: None drinks: Glucerna shakes (1x daily), water, regular ginger ale  -Current exercise: walking daily (15 minute sessions)  -Has not been able to pick up Lantus due to her pen needles not being available at the pharmacy. New pen needle Rx was sent in today, patient to pick up later this afternoon.  -Start Lantus 10 units nightly, then check in on blood sugar readings in one week.   Hyperlipidemia: (LDL goal < 70) -History of CABG x4 2017, 2018  -Uncontrolled -Current treatment: Rosuvastatin 40 mg daily Ezetimibe 10 mg daily   -Current antiplatelet treatment: Clopidogrel 75 mg daily  -Medications previously tried: Atorvastatin (changed to rosuvastatin during hospitalization) -Cholesterol improved, but still uncontrolled. Could consider switching Zetia to Repatha for improved benefit. Will defer for now while adjusting diabetes regimen.  -Recommended to continue current  medication  Atrial Fibrillation (Goal: prevent stroke and major bleeding) -Controlled -CHADSVASC: 6 -Current treatment: Rate control: Metoprolol tartrate 100 mg twice daily  Anticoagulation: Eliquis 5 mg twice daily  -Medications previously tried: NA -Home BP and HR readings: NA  -Counseled on importance of adherence to anticoagulant exactly as prescribed; avoidance of NSAIDs due to increased bleeding risk with anticoagulants; Careful monitoring due to increased bleeding risk with clopidogrel + Eliquis -Recommended to continue current medication  Heart Failure (Goal: manage symptoms and prevent exacerbations) -Controlled -Last ejection fraction: 55-60% (Date: June 2021) -HF type: Diastolic -NYHA Class: III (marked limitation of activity) -AHA HF Stage: C (Heart disease and symptoms present) -Current treatment: Imdur 30 mg daily  Losartan 25 mg daily Metoprolol tartrate 100 mg twice daily  -Medications previously tried: NA  -Current home BP/HR readings: NA -Educated on Importance of weighing daily; if you gain more than 3 pounds in one day or 5 pounds in one week, contact provider's office -Recommended to continue current medication  Depression/Anxiety (Goal: maintain stable mood) -Controlled -Current treatment: Sertraline 50 mg daily  -Medications previously tried/failed: NA -PHQ9: 0 -GAD7: 1 - Patient reports she manages her stress by talking with daughter, reading, or medidating  -Patient reports Dr. Rosanna Randy instructed her to increase sertraline to 100 mg daily, new Rx never sent in. -Increase sertraline to 100 mg daily as per Dr. Rosanna Randy last PCP visit.   GERD with dysphagia (Goal: prevent heartburn or reflux symptoms) -Controlled -Current treatment  Pantoprazole 40 mg daily   -Medications previously tried: Sucralfate,  -  Continue current medications  Patient Goals/Self-Care Activities Over the next 90 days, patient will:  - check glucose daily, before breakfast,  document, and provide at future appointments check blood pressure weekly, document, and provide at future appointments  Follow Up Plan: Telephone follow up appointment with care management team member scheduled for:  01/13/2021 at 8:30 AM      Medication Assistance: None required.  Patient affirms current coverage meets needs.  Patient's preferred pharmacy is:  [facility] - [place], [place] - [address] [place] [address] Phone: [phone] Fax: [phone]  [id number] [place] [id number]: [phone]: [phone] We discussed: Benefits of medication synchronization, packaging and delivery as well as enhanced pharmacist oversight with [facility]. Patient decided to: Utilize [facility] pharmacy for medication synchronization, packaging and delivery   Care Plan and Follow Up Patient Decision:  Patient agrees to Care Plan and Follow-up.  Plan: Telephone follow up appointment with care management team member scheduled for:  01/13/2021 at 8:30 AM  [phone]

## 2020-12-12 NOTE — Patient Instructions (Signed)
Visit Information It was great speaking with you today!  Please let me know if you have any questions about our visit.   Goals Addressed             This Visit's Progress    Monitor and Manage My Blood Sugar-Diabetes Type 2   On track    Timeframe:  Long-Range Goal Priority:  High Start Date: 04/15/20      Expected End Date: 02/26/22                   Follow Up within 30 days   - Check blood sugar daily before breakfast - Check blood sugar if I feel it is too high or too low - Enter blood sugar readings and medication or insulin into daily log - Take the blood sugar log to all doctor visits - Take the blood sugar meter to all doctor visits          Patient Care Plan: General Pharmacy (Adult)     Problem Identified: Hypertension, Hyperlipidemia, Diabetes, Atrial Fibrillation, Heart Failure, Coronary Artery Disease, GERD, Asthma, Depression and Anxiety   Priority: High     Long-Range Goal: Patient-Specific Goal   Start Date: 06/06/2020  Expected End Date: 11/25/2021  This Visit's Progress: On track  Recent Progress: On track  Priority: High  Note:   Current Barriers:  Unable to achieve control of diabetes  Unable to achieve control of cholesterol Does not adhere to prescribed medication regimen  Pharmacist Clinical Goal(s):  Over the next 90 days, patient will achieve control of diabetes as evidenced by A1c less than 8% patient will achieve control of cholesterol as evidenced by LDL less than 70 adhere to prescribed medication regimen as evidenced by utilization of enhanced pharmacy services through collaboration with PharmD and provider.   Interventions: 1:1 collaboration with [name]., MD regarding development and update of comprehensive plan of care as evidenced by provider attestation and co-signature Inter-disciplinary care team collaboration (see longitudinal plan of care) Comprehensive medication review performed; medication list updated in  electronic medical record  Diabetes (A1c goal <7%) -Uncontrolled -Diagnosed 2010 -Current medications: Trulicity 1.5 mg weekly on Tuesdays   Jardiance 25 mg daily  Lantus 10 units nightly (has not started) -Medications previously tried: Glipizide, Lantus, Tradjenta, Metformin, Metformin XR, Ozempic   -Current home glucose readings:    Fasting  30-Aug 273  29-Aug 187  28-Aug 216  27-Aug 171  26-Aug 187  25-Aug 197  24-Aug 203  23-Aug 217  22-Aug 203  21-Aug 245  Average 210    -Denies hypoglycemic/hyperglycemic symptoms -Current meal patterns: trying to eat more salads, water. Limiting carbohydrates when possible. 24 hour recall:  breakfast: Yogurt + Banana  lunch: Broccoli + Grapes (handful)  dinner: Mac&Cheese  + Broccoli + BBQ Ribs  snacks: None drinks: Glucerna shakes (1x daily), water, regular ginger ale  -Current exercise: walking daily (15 minute sessions)  -Has not been able to pick up Lantus due to her pen needles not being available at the pharmacy. New pen needle Rx was sent in today, patient to pick up later this afternoon.  -Start Lantus 10 units nightly, then check in on blood sugar readings in one week.   Hyperlipidemia: (LDL goal < 70) -History of CABG x4 2017, 2018  -Uncontrolled -Current treatment: Rosuvastatin 40 mg daily Ezetimibe 10 mg daily   -Current antiplatelet treatment: Clopidogrel 75 mg daily  -Medications previously tried: Atorvastatin (changed to rosuvastatin during hospitalization) -Cholesterol improved,  but still uncontrolled. Could consider switching Zetia to Repatha for improved benefit. Will defer for now while adjusting diabetes regimen.  -Recommended to continue current medication  Atrial Fibrillation (Goal: prevent stroke and major bleeding) -Controlled -CHADSVASC: 6 -Current treatment: Rate control: Metoprolol tartrate 100 mg twice daily  Anticoagulation: Eliquis 5 mg twice daily  -Medications previously tried: NA -Home BP  and HR readings: NA  -Counseled on importance of adherence to anticoagulant exactly as prescribed; avoidance of NSAIDs due to increased bleeding risk with anticoagulants; Careful monitoring due to increased bleeding risk with clopidogrel + Eliquis -Recommended to continue current medication  Heart Failure (Goal: manage symptoms and prevent exacerbations) -Controlled -Last ejection fraction: 55-60% (Date: June 2021) -HF type: Diastolic -NYHA Class: III (marked limitation of activity) -AHA HF Stage: C (Heart disease and symptoms present) -Current treatment: Imdur 30 mg daily  Losartan 25 mg daily Metoprolol tartrate 100 mg twice daily  -Medications previously tried: NA  -Current home BP/HR readings: NA -Educated on Importance of weighing daily; if you gain more than 3 pounds in one day or 5 pounds in one week, contact provider's office -Recommended to continue current medication  Depression/Anxiety (Goal: maintain stable mood) -Controlled -Current treatment: Sertraline 50 mg daily  -Medications previously tried/failed: NA -PHQ9: 0 -GAD7: 1 - Patient reports she manages her stress by talking with daughter, reading, or medidating  -Patient reports Dr. Rosanna Randy instructed her to increase sertraline to 100 mg daily, new Rx never sent in. -Increase sertraline to 100 mg daily as per Dr. Rosanna Randy last PCP visit.   GERD with dysphagia (Goal: prevent heartburn or reflux symptoms) -Controlled -Current treatment  Pantoprazole 40 mg daily   -Medications previously tried: Sucralfate,  -Continue current medications  Patient Goals/Self-Care Activities Over the next 90 days, patient will:  - check glucose daily, before breakfast, document, and provide at future appointments check blood pressure weekly, document, and provide at future appointments  Follow Up Plan: Telephone follow up appointment with care management team member scheduled for:  01/13/2021 at 8:30 AM     Patient agreed to  services and verbal consent obtained.   The patient verbalized understanding of instructions, educational materials, and care plan provided today and declined offer to receive copy of patient instructions, educational materials, and care plan.   Junius Argyle, PharmD, [facility], [place] [phone]

## 2020-12-15 ENCOUNTER — Telehealth: Payer: Self-pay

## 2020-12-15 NOTE — Progress Notes (Addendum)
Chronic Care Management Pharmacy Assistant   Name: Candace [name]  MRN: [id number] DOB: 1950-07-04  Reason for Encounter:Diabetes  Disease State Call.   Recent office visits:  No recent Office Visit  Recent consult visits:  No recent [facility] visits:  None in previous 6 months  Medications: Outpatient Encounter Medications as of 12/15/2020  Medication Sig Note   acetaminophen (TYLENOL) 325 MG tablet Take 650 mg by mouth every 6 (six) hours as needed for moderate pain or headache.     bismuth subsalicylate (PEPTO BISMOL) 262 MG chewable tablet Chew 524 mg by mouth as needed.    clopidogrel (PLAVIX) 75 MG tablet Take 1 tablet (75 mg total) by mouth daily.    ELIQUIS 5 MG TABS tablet Take 5 mg by mouth 2 (two) times daily. 06/06/2020: Prescribed by Dr. Nehemiah Massed   ezetimibe (ZETIA) 10 MG tablet Take 1 tablet (10 mg total) by mouth daily.    furosemide (LASIX) 20 MG tablet Take 20 mg by mouth.    glucose blood test strip Use to check blood sugars daily as instructed    insulin glargine (LANTUS SOLOSTAR) 100 UNIT/ML Solostar Pen Inject 10 Units into the skin at bedtime.    Insulin Pen Needle 32G X 4 MM MISC Use to inject insulin daily    isosorbide mononitrate (IMDUR) 120 MG 24 hr tablet Take 1 tablet (120 mg total) by mouth daily.    JARDIANCE 25 MG TABS tablet TAKE ONE TABLET BY MOUTH BEFORE BREAKFAST    Lancets (ONETOUCH DELICA PLUS [id number]) MISC USE UP TO 4 TIMES DAILY AS DIRECTED    loperamide (IMODIUM) 2 MG capsule Take 2 mg by mouth as needed.    losartan (COZAAR) 25 MG tablet TAKE ONE TABLET BY MOUTH EVERYDAY AT BEDTIME    metoprolol tartrate (LOPRESSOR) 100 MG tablet Take 100 mg by mouth 2 (two) times daily. 06/06/2020: Prescribed by Dr. Nehemiah Massed    nitroGLYCERIN (NITROSTAT) 0.4 MG SL tablet Place 1 tablet (0.4 mg total) under the tongue every 5 (five) minutes as needed for chest pain.    nystatin (NYSTATIN) powder Apply 1 application topically daily.  (Patient taking differently: Apply 1 application topically daily as needed.)    ondansetron (ZOFRAN-ODT) 8 MG disintegrating tablet Take 1 tablet (8 mg total) by mouth every 8 (eight) hours as needed.    pantoprazole (PROTONIX) 20 MG tablet Take 1 tablet (20 mg total) by mouth 2 (two) times daily.    rosuvastatin (CRESTOR) 40 MG tablet Take 1 tablet (40 mg total) by mouth daily.    sertraline (ZOLOFT) 100 MG tablet Take 1 tablet (100 mg total) by mouth daily.    TRULICITY 1.5 IZ/1.2WP SOPN Inject 1.5 mg into the skin once a week.    [DISCONTINUED] cetirizine (ZYRTEC) 5 MG tablet Take 2 tablets (10 mg total) by mouth daily.    No facility-administered encounter medications on file as of 12/15/2020.    Care Gaps: COVID 66 Vaccine Ophthalmology Exam Tetanus Shingrix Foot Exam Influenza Vaccine  A1C was 10.7% on 12/02/20. [facility]: 11/18/2020 Rosuvastatin 40 mg last filled for a 90- Day supply at [facility] 11/18/2020 Losartan 25 mg last filled for a 30-Day supply at [facility] 11/18/2020 Jardiance 25 mg last filled for a 10-Day Supply at [facility] 80/99/8338 Trulicity 1.5 mg last filled for a 30-Day supply at [facility] Gaps: None ID   Per Clinical Pharmacist, reach out to patient to see if she  picked up insulin + pen needles. See how she is doing.   Patient states she has pick up the insulin (Lantus),  but [facility] did not have pen needles or test strips in stock. [facility] her she will receive a text when they have it in stock.I ask the patient if she would like the clinical pharmacist to send the prescription to a different pharmacy.Patient states she prefers not to because she barely knows her way around [place].  Blood sugar readings: Fasting: On 12/13/2020 it was 201. Fasting: On 12/14/2020 it was 203.  Fasting: On 12/15/2020 it was 193.  Patient denies any Hypoglycemia or Hyperglycemia  symptoms. Inform patient I would be reaching out to her on a weekly basic to check in to see how she is doing with her diabetes and medication changes.  Patient verbalized understanding, and appreciation.Notified clinical pharmacist.   Anderson Malta Clinical Pharmacist Assistant [phone]    Addendum 9/20: Patient blood sugars still elevated and has not started recommended insulin yet. Will give patient one more week to start medication otherwise will plan for CPP follow-up to discuss any patient concerns.  Junius Argyle, PharmD, [facility], [place] [phone]

## 2020-12-16 NOTE — Progress Notes (Signed)
Per Clinical Pharmacist, [facility] [place] and see if there is a different pen needle I can prescribe to get it to her?   [facility], Patient did pick up the pen needles but did not pick up the test strips or Insulin. Per [facility], they do have the insulin in stock, but the test strips should arrive sometime today.Notified Clinical Pharmacist.  Bessie De Kalb Pharmacist Assistant [phone]

## 2020-12-18 ENCOUNTER — Ambulatory Visit: Payer: Self-pay

## 2020-12-18 DIAGNOSIS — I25110 Atherosclerotic heart disease of native coronary artery with unstable angina pectoris: Secondary | ICD-10-CM

## 2020-12-18 DIAGNOSIS — E1142 Type 2 diabetes mellitus with diabetic polyneuropathy: Secondary | ICD-10-CM

## 2020-12-18 DIAGNOSIS — I5032 Chronic diastolic (congestive) heart failure: Secondary | ICD-10-CM

## 2020-12-18 DIAGNOSIS — I480 Paroxysmal atrial fibrillation: Secondary | ICD-10-CM

## 2020-12-18 NOTE — Chronic Care Management (AMB) (Signed)
Chronic Care Management   CCM RN Visit Note  12/18/2020 Name: Candace [name] MRN: [id number] DOB: 1950-11-24  Subjective: Candace [name] is a 70 y.o. year old female who is a primary care patient of [name]., MD. The care management team was consulted for assistance with disease management and care coordination needs.    Returned call to patient. Engaged with patient by telephone for follow up visit in response to provider referral for case management and care coordination services.   Consent to Services:  The patient was given information about Chronic Care Management services, agreed to services, and gave verbal consent prior to initiation of services.  Please see initial visit note for detailed documentation.    Assessment: Review of patient past medical history, allergies, medications, health status, including review of consultants reports, laboratory and other test data, was performed as part of comprehensive evaluation and provision of chronic care management services.   SDOH (Social Determinants of Health) assessments and interventions performed: No  CCM Care Plan  Allergies  Allergen Reactions   Lisinopril Cough   Penicillins Swelling, Rash and Other (See Comments)    Did it involve swelling of the face/tongue/throat, SOB, or low BP? Yes Did it involve sudden or severe rash/hives, skin peeling, or any reaction on the inside of your mouth or nose? Yes Did you need to seek medical attention at a hospital or doctor's office? Yes When did it last happen?      15 years If all above answers are "NO", may proceed with cephalosporin use.     Outpatient Encounter Medications as of 12/18/2020  Medication Sig Note   acetaminophen (TYLENOL) 325 MG tablet Take 650 mg by mouth every 6 (six) hours as needed for moderate pain or headache.     bismuth subsalicylate (PEPTO BISMOL) 262 MG chewable tablet Chew 524 mg by mouth as needed.    clopidogrel (PLAVIX) 75 MG tablet  Take 1 tablet (75 mg total) by mouth daily.    ELIQUIS 5 MG TABS tablet Take 5 mg by mouth 2 (two) times daily. 06/06/2020: Prescribed by Dr. Nehemiah Massed   ezetimibe (ZETIA) 10 MG tablet Take 1 tablet (10 mg total) by mouth daily.    furosemide (LASIX) 20 MG tablet Take 20 mg by mouth.    glucose blood test strip Use to check blood sugars daily as instructed    insulin glargine (LANTUS SOLOSTAR) 100 UNIT/ML Solostar Pen Inject 10 Units into the skin at bedtime.    Insulin Pen Needle 32G X 4 MM MISC Use to inject insulin daily    isosorbide mononitrate (IMDUR) 120 MG 24 hr tablet Take 1 tablet (120 mg total) by mouth daily.    JARDIANCE 25 MG TABS tablet TAKE ONE TABLET BY MOUTH BEFORE BREAKFAST    Lancets (ONETOUCH DELICA PLUS [id number]) MISC USE UP TO 4 TIMES DAILY AS DIRECTED    loperamide (IMODIUM) 2 MG capsule Take 2 mg by mouth as needed.    losartan (COZAAR) 25 MG tablet TAKE ONE TABLET BY MOUTH EVERYDAY AT BEDTIME    metoprolol tartrate (LOPRESSOR) 100 MG tablet Take 100 mg by mouth 2 (two) times daily. 06/06/2020: Prescribed by Dr. Nehemiah Massed    nitroGLYCERIN (NITROSTAT) 0.4 MG SL tablet Place 1 tablet (0.4 mg total) under the tongue every 5 (five) minutes as needed for chest pain.    nystatin (NYSTATIN) powder Apply 1 application topically daily. (Patient taking differently: Apply 1 application topically daily as needed.)  ondansetron (ZOFRAN-ODT) 8 MG disintegrating tablet Take 1 tablet (8 mg total) by mouth every 8 (eight) hours as needed.    pantoprazole (PROTONIX) 20 MG tablet Take 1 tablet (20 mg total) by mouth 2 (two) times daily.    rosuvastatin (CRESTOR) 40 MG tablet Take 1 tablet (40 mg total) by mouth daily.    sertraline (ZOLOFT) 100 MG tablet Take 1 tablet (100 mg total) by mouth daily.    TRULICITY 1.5 GE/9.5MW SOPN Inject 1.5 mg into the skin once a week.    [DISCONTINUED] cetirizine (ZYRTEC) 5 MG tablet Take 2 tablets (10 mg total) by mouth daily.    No  facility-administered encounter medications on file as of 12/18/2020.    Patient Active Problem List   Diagnosis Date Noted   Vertigo    Unsteady gait    Acute bronchitis 03/07/2019   Esophageal dysphagia    Stomach irritation    Gastric polyp    Esophageal lump    Hx of colonic polyp    Polyp of colon    Diverticulosis of large intestine without diverticulitis    PSVT (paroxysmal supraventricular tachycardia) ([place]) 10/03/2018   Abnormal ECG 07/05/2018   Tachycardia 03/27/2018   Pain in limb 02/28/2018   Chronic diastolic CHF (congestive heart failure) ([place]) 11/03/2017   TIA (transient ischemic attack) 11/03/2017   COPD suggested by initial evaluation ([place]) 08/23/2017   Acute and chronic respiratory failure with hypoxia ([place]) 08/23/2017   Postoperative state 08/15/2017   Incidental pulmonary nodule 08/01/2017   Non-ST elevation myocardial infarction (NSTEMI), subendocardial infarction, subsequent episode of care ([place]) 06/01/2017   B12 deficiency 12/01/2016   Vitamin D deficiency 11/04/2016   Depression with anxiety 09/30/2016   AF (paroxysmal atrial fibrillation) ([place]) 09/30/2016   Resting tremor 09/30/2016   Mild obstructive sleep apnea 09/30/2016   Headache 08/18/2016   Unstable angina ([place]) 08/03/2016   Type 2 diabetes mellitus with complication, without long-term current use of insulin ([place]) 08/03/2016   Chronic daily headache 05/05/2016   Asthma 11/11/2015   S/P CABG x 4 11/10/2015   Coronary artery disease 11/07/2015   Carotid artery narrowing 02/08/2014   Chest pain 02/08/2014   Mixed hyperlipidemia 02/08/2014   Bilateral carotid artery stenosis 02/08/2014   Atypical chest pain 02/07/2014   Essential hypertension 02/07/2014   Awareness of heartbeats 02/07/2014   D (diarrhea) 10/05/2013   Gastroesophageal reflux disease 10/05/2013    Conditions to be addressed/monitored:Atrial Fibrillation and DMII  Patient Care Plan: Diabetes Type 2 (Adult)     Problem  Identified: Glycemic Management (Diabetes, Type 2)   Priority: High  Onset Date: 04/15/2020     Long-Range Goal: Glycemic Management Optimized   Start Date: 11/21/2020  Expected End Date: 03/21/2021  Recent Progress: On track  Priority: High  Note:    Current Barriers:  Chronic Disease Management support and educational needs r/t Diabetes self management.   Case Manager Clinical Goal(s):  Over the next 120 days, patient will demonstrate improved adherence to prescribed treatment plan for Diabetes self management as evidenced by daily monitoring and recording of CBG, adherence to ADA/ carb modified diet and adherence to prescribed medication regimen.  Interventions:  Collaboration with Miguel Aschoff, MD regarding development and update of comprehensive plan of care as evidenced by provider attestation and co-signature Inter-disciplinary care team collaboration (see longitudinal plan of care) Reviewed recent blood glucose readings and compliance with current treatment plan. Reports taking medications as prescribed. She is still waiting to receive a Lantus/Solostar insulin  pen. The insulin pen is currently on backorder at there local pharmacy. Reports being instructed that it should be available within the week. Message forwarded to the [place] team. She agreed to call if the device remains on backorder and the script needs to be sent to another pharmacy. Discussed importance of consistent blood glucose monitoring. She is currently pending receipt of her Onetouch Delica test strips. Advised to monitor and record readings once supplies are received. Reviewed s/sx of hypoglycemia and hyperglycemia along with recommended interventions.  Update on 12/18/20: Ms. Pelly called to inform the care management team that she has been unable to obtain her insulin pen due to it still being on backorder at the [facility]. She will be in [place] for a provider visit on 12/25/20. Requesting to  obtain backordered medications while she is in [place]. Message forwarded to the [facility] team. She agreed to call if additional assistance is required.   Patient Goals/Self-Care Activities Self administer medications as prescribed Attend all scheduled provider appointments Monitor blood glucose levels consistently and utilize recommended interventions Monitor closely for s/sx of hyperglycemia and seek medical attention if needed Adhere to prescribed ADA/carb modified Update the [facility] Pharmacy if unable to obtain Lantus/Solostar insulin pen Notify provider or care management team with questions and new concerns as needed.   Follow Up Plan:  Will follow up within the next month    Patient Care Plan: Atrial Fibrillation (Adult)     Problem Identified: Dysrhythmia (Atrial Fibrillation)      Long-Range Goal: Heart Rate and Rhythm Monitored and Managed   Start Date: 12/05/2020  Expected End Date: 03/05/2021  Priority: High  Note:   Current Barriers:  Chronic Disease Management support and educational needs related to A-Fib.  Case Manager Clinical Goal(s):  Over the next 90 days, patient will not require hospitalization due to complications related to A-Fib.  Interventions:  Collaboration with [name]., MD regarding development and update of comprehensive plan of care as evidenced by provider attestation and co-signature Inter-disciplinary care team collaboration (see longitudinal plan of care) Reviewed medications and current treatment plan. Advised to continue taking medications as prescribed. Advised to notify the Cardiology team if unable to tolerate the prescribed regimen. Discussed symptoms since being in the Emergency Department on yesterday. Reports doing well at time of call. Denies chest discomfort, palpitations or shortness of breath. Advised to assess symptoms and monitor blood pressure and pulse daily. Discussed concerns regarding pending an elective  dental procedure later this month. She will be required to stop taking blood thinners prior to her tooth extraction. She will require clearance from the PCP or the Cardiology team. Advised to reschedule the procedure for a later date given her recent symptoms and ED evaluation. Agreed to submit clearance request and reschedule the dental procedure for October or later. Reviewed ED discharge instructions and required follow up appointments. Thoroughly reviewed indications for seeking immediate medical attention. Updated on 12/18/20: Requested confirmation with Cardiology appointment date. Chart reviewed. Informed of appointment with Dr. Alveria Apley PA on 12/26/20 at 2pm. Denies concerning symptoms. Reviewed indications for seeking immediate medical attention if she experiences worsening symptoms prior to the Cardiology follow up.    Self-Care/Patient Goals: Take medications as prescribed Attend provider appointment as scheduled Assess symptoms daily Seek immediate medical attention for worsening/unrelieved symptoms Submit request/clearance form for dental procedure Contact the clinic with questions and new concerns if needed   Follow Up Plan:  Will follow up within the next month  PLAN: A member of the care management team will follow up to discuss medication concerns within the next week.   [name] Care Management [facility] [phone]

## 2020-12-18 NOTE — Patient Instructions (Addendum)
Thank you for allowing the Chronic Care Management team to participate in your care.    Patient Care Plan: Diabetes Type 2 (Adult)     Problem Identified: Glycemic Management (Diabetes, Type 2)   Priority: High  Onset Date: 04/15/2020     Long-Range Goal: Glycemic Management Optimized   Start Date: 11/21/2020  Expected End Date: 03/21/2021  Recent Progress: On track  Priority: High  Note:    Current Barriers:  Chronic Disease Management support and educational needs r/t Diabetes self management.   Case Manager Clinical Goal(s):  Over the next 120 days, patient will demonstrate improved adherence to prescribed treatment plan for Diabetes self management as evidenced by daily monitoring and recording of CBG, adherence to ADA/ carb modified diet and adherence to prescribed medication regimen.  Interventions:  Collaboration with Miguel Aschoff, MD regarding development and update of comprehensive plan of care as evidenced by provider attestation and co-signature Inter-disciplinary care team collaboration (see longitudinal plan of care) Reviewed recent blood glucose readings and compliance with current treatment plan. Reports taking medications as prescribed. She is still waiting to receive a Lantus/Solostar insulin pen. The insulin pen is currently on backorder at there local pharmacy. Reports being instructed that it should be available within the week. Message forwarded to the [facility] team. She agreed to call if the device remains on backorder and the script needs to be sent to another pharmacy. Discussed importance of consistent blood glucose monitoring. She is currently pending receipt of her Onetouch Delica test strips. Advised to monitor and record readings once supplies are received. Reviewed s/sx of hypoglycemia and hyperglycemia along with recommended interventions.  Update on 12/18/20: Ms. Grindle called to inform the care management team that she has been unable to obtain  her insulin pen due to it still being on backorder at the [facility]. She will be in [place] for a provider visit on 12/25/20. Requesting to obtain backordered medications while she is in [place]. Message forwarded to the [facility] team. She agreed to call if additional assistance is required.   Patient Goals/Self-Care Activities Self administer medications as prescribed Attend all scheduled provider appointments Monitor blood glucose levels consistently and utilize recommended interventions Monitor closely for s/sx of hyperglycemia and seek medical attention if needed Adhere to prescribed ADA/carb modified Update the [facility] Pharmacy if unable to obtain Lantus/Solostar insulin pen Notify provider or care management team with questions and new concerns as needed.   Follow Up Plan:  Will follow up within the next month    Patient Care Plan: Atrial Fibrillation (Adult)     Problem Identified: Dysrhythmia (Atrial Fibrillation)      Long-Range Goal: Heart Rate and Rhythm Monitored and Managed   Start Date: 12/05/2020  Expected End Date: 03/05/2021  Priority: High  Note:   Current Barriers:  Chronic Disease Management support and educational needs related to A-Fib.  Case Manager Clinical Goal(s):  Over the next 90 days, patient will not require hospitalization due to complications related to A-Fib.  Interventions:  Collaboration with [name]., MD regarding development and update of comprehensive plan of care as evidenced by provider attestation and co-signature Inter-disciplinary care team collaboration (see longitudinal plan of care) Reviewed medications and current treatment plan. Advised to continue taking medications as prescribed. Advised to notify the Cardiology team if unable to tolerate the prescribed regimen. Discussed symptoms since being in the Emergency Department on yesterday. Reports doing well at time of call. Denies chest discomfort, palpitations  or shortness  of breath. Advised to assess symptoms and monitor blood pressure and pulse daily. Discussed concerns regarding pending an elective dental procedure later this month. She will be required to stop taking blood thinners prior to her tooth extraction. She will require clearance from the PCP or the Cardiology team. Advised to reschedule the procedure for a later date given her recent symptoms and ED evaluation. Agreed to submit clearance request and reschedule the dental procedure for October or later. Reviewed ED discharge instructions and required follow up appointments. Thoroughly reviewed indications for seeking immediate medical attention. Updated on 12/18/20: Requested confirmation with Cardiology appointment date. Chart reviewed. Informed of appointment with Dr. Alveria Apley PA on 12/26/20 at 2pm. Denies concerning symptoms. Reviewed indications for seeking immediate medical attention if she experiences worsening symptoms prior to the Cardiology follow up.    Self-Care/Patient Goals: Take medications as prescribed Attend provider appointment as scheduled Assess symptoms daily Seek immediate medical attention for worsening/unrelieved symptoms Submit request/clearance form for dental procedure Contact the clinic with questions and new concerns if needed   Follow Up Plan:  Will follow up within the next month      Ms. Woehrle verbalized understanding of the information discussed during the telephonic outreach. Declined need for mailed/printed instructions. A member of the care management team will follow up regarding medication concerns within the next week.   [name] Care Management [facility] [phone]

## 2020-12-22 NOTE — Chronic Care Management (AMB) (Signed)
Error. Please disregard

## 2020-12-23 ENCOUNTER — Telehealth: Payer: Self-pay

## 2020-12-23 DIAGNOSIS — E1142 Type 2 diabetes mellitus with diabetic polyneuropathy: Secondary | ICD-10-CM

## 2020-12-23 DIAGNOSIS — E118 Type 2 diabetes mellitus with unspecified complications: Secondary | ICD-10-CM

## 2020-12-23 MED ORDER — INSULIN PEN NEEDLE 32G X 4 MM MISC: 1 refills | Status: DC

## 2020-12-23 MED ORDER — LANTUS SOLOSTAR 100 UNIT/ML ~~LOC~~ SOPN: 10.0000 [IU] | PEN_INJECTOR | Freq: Every day | SUBCUTANEOUS | 4 refills | Status: DC

## 2020-12-23 MED ORDER — GLUCOSE BLOOD VI STRP: ORAL_STRIP | 12 refills | Status: DC

## 2020-12-23 NOTE — Progress Notes (Addendum)
Chronic Care Management Pharmacy Assistant   Name: Candace [name]  MRN: [id number] DOB: Oct 20, 1950   Reason for Encounter:Diabetes Disease State Call.     Recent office visits:  12/18/2020 Neldon Labella RN (CCM)  Recent consult visits:  No recent [facility] visits:  None in previous 6 months  Medications: Outpatient Encounter Medications as of 12/23/2020  Medication Sig Note   acetaminophen (TYLENOL) 325 MG tablet Take 650 mg by mouth every 6 (six) hours as needed for moderate pain or headache.     bismuth subsalicylate (PEPTO BISMOL) 262 MG chewable tablet Chew 524 mg by mouth as needed.    clopidogrel (PLAVIX) 75 MG tablet Take 1 tablet (75 mg total) by mouth daily.    ELIQUIS 5 MG TABS tablet Take 5 mg by mouth 2 (two) times daily. 06/06/2020: Prescribed by Dr. Nehemiah Massed   ezetimibe (ZETIA) 10 MG tablet Take 1 tablet (10 mg total) by mouth daily.    furosemide (LASIX) 20 MG tablet Take 20 mg by mouth.    glucose blood test strip Use to check blood sugars daily as instructed    insulin glargine (LANTUS SOLOSTAR) 100 UNIT/ML Solostar Pen Inject 10 Units into the skin at bedtime.    Insulin Pen Needle 32G X 4 MM MISC Use to inject insulin daily    isosorbide mononitrate (IMDUR) 120 MG 24 hr tablet Take 1 tablet (120 mg total) by mouth daily.    JARDIANCE 25 MG TABS tablet TAKE ONE TABLET BY MOUTH BEFORE BREAKFAST    Lancets (ONETOUCH DELICA PLUS [id number]) MISC USE UP TO 4 TIMES DAILY AS DIRECTED    loperamide (IMODIUM) 2 MG capsule Take 2 mg by mouth as needed.    losartan (COZAAR) 25 MG tablet TAKE ONE TABLET BY MOUTH EVERYDAY AT BEDTIME    metoprolol tartrate (LOPRESSOR) 100 MG tablet Take 100 mg by mouth 2 (two) times daily. 06/06/2020: Prescribed by Dr. Nehemiah Massed    nitroGLYCERIN (NITROSTAT) 0.4 MG SL tablet Place 1 tablet (0.4 mg total) under the tongue every 5 (five) minutes as needed for chest pain.    nystatin (NYSTATIN) powder Apply 1 application  topically daily. (Patient taking differently: Apply 1 application topically daily as needed.)    ondansetron (ZOFRAN-ODT) 8 MG disintegrating tablet Take 1 tablet (8 mg total) by mouth every 8 (eight) hours as needed.    pantoprazole (PROTONIX) 20 MG tablet Take 1 tablet (20 mg total) by mouth 2 (two) times daily.    rosuvastatin (CRESTOR) 40 MG tablet Take 1 tablet (40 mg total) by mouth daily.    sertraline (ZOLOFT) 100 MG tablet Take 1 tablet (100 mg total) by mouth daily.    TRULICITY 1.5 IO/9.7DZ SOPN Inject 1.5 mg into the skin once a week.    [DISCONTINUED] cetirizine (ZYRTEC) 5 MG tablet Take 2 tablets (10 mg total) by mouth daily.    No facility-administered encounter medications on file as of 12/23/2020.    Care Gaps: COVID 42 Vaccine Ophthalmology Exam Tetanus Shingrix Foot Exam Influenza Vaccine  A1C was 10.7% on 12/02/20. [facility]: 11/18/2020 Rosuvastatin 40 mg last filled for a 90- Day supply at [facility] 11/18/2020 Losartan 25 mg last filled for a 30-Day supply at [facility] 11/18/2020 Jardiance 25 mg last filled for a 10-Day Supply at [facility] 32/99/2426 Trulicity 1.5 mg last filled for a 30-Day supply at [facility] Medication Fill Gaps: None ID  Recent Relevant Labs: Lab Results  Component Value Date/Time  HGBA1C 10.7 (H) 12/02/2020 04:06 PM   HGBA1C 9.1 (H) 08/11/2020 09:32 AM   HGBA1C 9.2 07/10/2019 12:00 AM   HGBA1C 8.3 (H) 07/14/2013 05:42 AM   MICROALBUR 50 08/11/2020 08:51 AM    Kidney Function Lab Results  Component Value Date/Time   CREATININE 0.65 12/04/2020 11:18 AM   CREATININE 0.93 12/02/2020 04:06 PM   CREATININE 1.39 (H) 03/24/2017 04:38 PM   CREATININE 0.78 04/29/2014 08:42 PM   CREATININE 0.79 02/03/2014 04:06 PM   GFRNONAA >60 12/04/2020 11:18 AM   GFRNONAA >60 04/29/2014 08:42 PM   GFRNONAA >60 09/16/2013 05:23 AM   GFRAA 108 02/06/2020 09:49 AM   GFRAA >60 04/29/2014 08:42 PM   GFRAA >60 09/16/2013  05:23 AM    Patient denies hypoglycemic symptoms, including Pale, Sweaty, Shaky, Hungry, Nervous/irritable, and Vision changes Patient denies hyperglycemic symptoms, including blurry vision, excessive thirst, fatigue, polyuria, and weakness  What are your blood sugars ranging?  Fasting:  On 12/17/2020 it was 201. On 12/18/2020 it was 187. On 12/19/2020 it was 197. Patient states she has not started Lantus because the pharmacy  still does not have it.Patient would like to get  Lantus and test strips [facility] tomorrow on 12/24/2020 since she will be in town.Patient states she has been out of test strips since Friday, and has not been able to check her blood sugars.Notified Clinical Pharmacist.  Completed acute form for Lantus 100 units/ML Solostar pen and Glucose blood test strip to be delivered on 12/24/2020.Sent form to clinical pharmacist for review.   [place] Pharmacist Assistant [phone]   Addendum: Refills for Lantus, Test Strips, and Pen Needles sent in to [facility]

## 2020-12-23 NOTE — Addendum Note (Signed)
Addended by: Daron Offer A on: 12/23/2020 04:24 PM   Modules accepted: Orders

## 2020-12-25 ENCOUNTER — Ambulatory Visit
Admission: RE | Admit: 2020-12-25 | Discharge: 2020-12-25 | Disposition: A | Discharge status: Home / Self Care | Payer: Medicare Other | Source: Ambulatory Visit | Attending: Family Medicine | Admitting: Family Medicine

## 2020-12-25 ENCOUNTER — Encounter: Payer: Self-pay | Admitting: Family Medicine

## 2020-12-25 ENCOUNTER — Other Ambulatory Visit: Payer: Self-pay

## 2020-12-25 ENCOUNTER — Ambulatory Visit: Payer: Self-pay

## 2020-12-25 ENCOUNTER — Ambulatory Visit (INDEPENDENT_AMBULATORY_CARE_PROVIDER_SITE_OTHER): Payer: Medicare Other | Admitting: Family Medicine

## 2020-12-25 ENCOUNTER — Encounter: Payer: Self-pay | Admitting: *Deleted

## 2020-12-25 VITALS — BP 114/72 | HR 88 | Resp 18 | Ht 62.0 in | Wt 164.0 lb

## 2020-12-25 DIAGNOSIS — I25110 Atherosclerotic heart disease of native coronary artery with unstable angina pectoris: Secondary | ICD-10-CM

## 2020-12-25 DIAGNOSIS — I10 Essential (primary) hypertension: Secondary | ICD-10-CM | POA: Diagnosis not present

## 2020-12-25 DIAGNOSIS — E118 Type 2 diabetes mellitus with unspecified complications: Secondary | ICD-10-CM

## 2020-12-25 DIAGNOSIS — Z951 Presence of aortocoronary bypass graft: Secondary | ICD-10-CM | POA: Diagnosis not present

## 2020-12-25 DIAGNOSIS — Z23 Encounter for immunization: Secondary | ICD-10-CM | POA: Diagnosis not present

## 2020-12-25 DIAGNOSIS — R911 Solitary pulmonary nodule: Secondary | ICD-10-CM

## 2020-12-25 DIAGNOSIS — I480 Paroxysmal atrial fibrillation: Secondary | ICD-10-CM

## 2020-12-25 DIAGNOSIS — E782 Mixed hyperlipidemia: Secondary | ICD-10-CM

## 2020-12-25 DIAGNOSIS — E1165 Type 2 diabetes mellitus with hyperglycemia: Secondary | ICD-10-CM | POA: Diagnosis not present

## 2020-12-25 DIAGNOSIS — Z1231 Encounter for screening mammogram for malignant neoplasm of breast: Secondary | ICD-10-CM | POA: Diagnosis not present

## 2020-12-25 DIAGNOSIS — I5032 Chronic diastolic (congestive) heart failure: Secondary | ICD-10-CM

## 2020-12-25 NOTE — Progress Notes (Signed)
I,[name],acting as a scribe for [name], MD.,have documented all relevant documentation on the behalf of [name], MD,as directed by  [name], MD while in the presence of [name], MD.   Established patient visit   Patient: Candace [name]   DOB: [birth year]   70 y.o. Female  MRN: [id number] Visit Date: 12/25/2020  Today's healthcare provider: [name], MD   Chief Complaint  Patient presents with   Follow-up   Diabetes   Hypertension   Subjective    HPI  Patient is actually feeling much better recently. She needs to be able to stop her Eliquis and Plavix before a dental procedure but she needs.  Diabetes Mellitus Type II, follow-up  Lab Results  Component Value Date   HGBA1C 10.7 (H) 12/02/2020   HGBA1C 9.1 (H) 08/11/2020   HGBA1C 12.2 (A) 05/01/2020   Last seen for diabetes 3 months ago.  Management since then includes; Poorly controlled diabetes with most recent A1c of 12.2 which today is 9.3 which is improved but not good enough.  At this time increase Trulicity to 1.5 weekly. She reports fair compliance with treatment. She is not having side effects. none  Home blood sugar records: fasting range: 187-203  Episodes of hypoglycemia? No none   Current insulin regiment: Lantus 10 units qhs and Trulicity 1.5 mg weekly. Most Recent Eye Exam: 06/28/2019  --------------------------------------------------------------------------------------------------- Hypertension, follow-up  BP Readings from Last 3 Encounters:  12/25/20 114/72  12/04/20 132/66  12/02/20 96/62   Wt Readings from Last 3 Encounters:  12/25/20 164 lb (74.4 kg)  10/04/20 167 lb (75.8 kg)  09/24/20 167 lb (75.8 kg)     She was last seen for hypertension 3 months ago.  BP at that visit was 108/76. Management since that visit includes; started losartan 25 mg qd.. She reports good compliance with treatment. She is not having side effects.  none She is not exercising. She is adherent to low salt diet.   Outside blood pressures are not checking.  She does not smoke.  Use of agents associated with hypertension: none.   ---------------------------------------------------------------------------------------------------     Medications: Outpatient Medications Prior to Visit  Medication Sig   acetaminophen (TYLENOL) 325 MG tablet Take 650 mg by mouth every 6 (six) hours as needed for moderate pain or headache.    bismuth subsalicylate (PEPTO BISMOL) 262 MG chewable tablet Chew 524 mg by mouth as needed.   clopidogrel (PLAVIX) 75 MG tablet Take 1 tablet (75 mg total) by mouth daily.   ELIQUIS 5 MG TABS tablet Take 5 mg by mouth 2 (two) times daily.   ezetimibe (ZETIA) 10 MG tablet Take 1 tablet (10 mg total) by mouth daily.   furosemide (LASIX) 20 MG tablet Take 20 mg by mouth.   glucose blood test strip Use to check blood sugars daily as instructed   insulin glargine (LANTUS SOLOSTAR) 100 UNIT/ML Solostar Pen Inject 10 Units into the skin at bedtime.   Insulin Pen Needle 32G X 4 MM MISC Use to inject insulin daily   isosorbide mononitrate (IMDUR) 120 MG 24 hr tablet Take 1 tablet (120 mg total) by mouth daily.   JARDIANCE 25 MG TABS tablet TAKE ONE TABLET BY MOUTH BEFORE BREAKFAST   Lancets (ONETOUCH DELICA PLUS [id number]) MISC USE UP TO 4 TIMES DAILY AS DIRECTED   loperamide (IMODIUM) 2 MG capsule Take 2 mg by mouth as needed.   losartan (COZAAR) 25 MG  tablet TAKE ONE TABLET BY MOUTH EVERYDAY AT BEDTIME   metoprolol tartrate (LOPRESSOR) 100 MG tablet Take 100 mg by mouth 2 (two) times daily.   nitroGLYCERIN (NITROSTAT) 0.4 MG SL tablet Place 1 tablet (0.4 mg total) under the tongue every 5 (five) minutes as needed for chest pain.   nystatin (NYSTATIN) powder Apply 1 application topically daily. (Patient taking differently: Apply 1 application topically daily as needed.)   ondansetron (ZOFRAN-ODT) 8 MG disintegrating tablet  Take 1 tablet (8 mg total) by mouth every 8 (eight) hours as needed.   pantoprazole (PROTONIX) 20 MG tablet Take 1 tablet (20 mg total) by mouth 2 (two) times daily.   rosuvastatin (CRESTOR) 40 MG tablet Take 1 tablet (40 mg total) by mouth daily.   sertraline (ZOLOFT) 100 MG tablet Take 1 tablet (100 mg total) by mouth daily.   TRULICITY 1.5 ST/4.1DQ SOPN Inject 1.5 mg into the skin once a week.   No facility-administered medications prior to visit.    Review of Systems  Constitutional:  Negative for appetite change, chills, fatigue and fever.  Respiratory:  Negative for chest tightness and shortness of breath.   Cardiovascular:  Negative for chest pain and palpitations.  Gastrointestinal:  Negative for abdominal pain, nausea and vomiting.  Neurological:  Negative for dizziness and weakness.       Objective    BP 114/72 (BP Location: Right Arm, Patient Position: Sitting, Cuff Size: Large)   Pulse 88   Resp 18   Ht 5\' 2"  (1.575 m)   Wt 164 lb (74.4 kg)   SpO2 96%   BMI 30.00 kg/m  BP Readings from Last 3 Encounters:  12/25/20 114/72  12/04/20 132/66  12/02/20 96/62   Wt Readings from Last 3 Encounters:  12/25/20 164 lb (74.4 kg)  10/04/20 167 lb (75.8 kg)  09/24/20 167 lb (75.8 kg)      Physical Exam Vitals and nursing note reviewed.  Constitutional:      Appearance: Normal appearance. She is normal weight.  HENT:     Right Ear: Tympanic membrane normal.     Left Ear: Tympanic membrane normal.     Nose: Nose normal.     Mouth/Throat:     Mouth: Mucous membranes are moist.     Pharynx: Oropharynx is clear.  Eyes:     Conjunctiva/sclera: Conjunctivae normal.  Cardiovascular:     Rate and Rhythm: Normal rate and regular rhythm.     Pulses: Normal pulses.     Heart sounds: Normal heart sounds.  Pulmonary:     Effort: Pulmonary effort is normal.     Breath sounds: Normal breath sounds.  Abdominal:     General: Bowel sounds are normal.     Palpations: Abdomen is  soft.  Musculoskeletal:        General: Normal range of motion.     Cervical back: Normal range of motion and neck supple.  Skin:    General: Skin is warm and dry.  Neurological:     Mental Status: She is alert.  Psychiatric:        Mood and Affect: Mood normal.        Behavior: Behavior normal.        Thought Content: Thought content normal.        Judgment: Judgment normal.      No results found for any visits on 12/25/20.  Assessment & Plan     1. Uncontrolled type 2 diabetes mellitus with hyperglycemia (HCC) A1c  in December to January  2. Benign essential HTN Good control with blood pressure today  3. Need for influenza vaccination  - Flu Vaccine QUAD High Dose(Fluad)  4. Essential hypertension   5. Coronary artery disease involving native coronary artery of native heart with unstable angina pectoris ([place]) All risk factors treated.  Can stop Eliquis and Plavix prior to her dental procedure  6. Type 2 diabetes mellitus with complication, without long-term current use of insulin (HCC)   7. S/P CABG x 4   8. Incidental pulmonary nodule   9. Mixed hyperlipidemia On Crestor at 40 mg daily   Return in about 4 months (around 04/29/2021).      I, [name], MD, have reviewed all documentation for this visit. The documentation on 12/26/20 for the exam, diagnosis, procedures, and orders are all accurate and complete.    Trixy Loyola Cranford Mon, MD  [facility] [phone] (phone) [phone] (fax)  [place]

## 2020-12-25 NOTE — Patient Instructions (Addendum)
Thank you for allowing the Chronic Care Management team to participate in your care.    Patient Care Plan: Heart Failure (Adult)     Problem Identified: Symptom Exacerbation (Heart Failure)   Priority: High  Onset Date: 04/15/2020     Long-Range Goal: Symptom Exacerbation Prevented or Minimized   Start Date: 09/09/2020  Expected End Date: 01/07/2021  Recent Progress: On track  Priority: High  Note:    Current Barriers:  Chronic Disease Management support and educational needs r/t CHF.  Case Manager Clinical Goal(s):  Over the next 120 days, patient will not require hospitalization or emergent evaluation d/t complications r/t CHF exacerbation.  Interventions:  Collaboration with [name]., MD regarding development and update of comprehensive plan of care as evidenced by provider attestation and co-signature Inter-disciplinary care team collaboration (see longitudinal plan of care) Reviewed medications and current plan for CHF management. Reports compliance with medications and nutritional intake. Report doing well with monitoring sodium and nutritional intake.  Reviewed weight parameters and indications for notifying a provider. Reports weights have remained stable. Thoroughly reviewed s/sx of fluid overload and indications for notifying a provider. Reviewed s/sx of complications r/t CHF exacerbation and indications for seeking immediate medical attention.   Patient Goals/Self-Care Activities:  Take medications as prescribed Attend medical appointments as scheduled Follow plan for CHF symptom management and notify provider with concerns Monitor and record weights Adhere to recommended cardiac diet Contact clinic with questions and new concerns as needed    Follow Up Plan:  Will follow up within the next month    Patient Care Plan: Atrial Fibrillation (Adult)     Problem Identified: Dysrhythmia (Atrial Fibrillation)      Long-Range Goal: Heart Rate and  Rhythm Monitored and Managed   Start Date: 12/05/2020  Expected End Date: 03/05/2021  Priority: High  Note:   Current Barriers:  Chronic Disease Management support and educational needs related to A-Fib.  Case Manager Clinical Goal(s):  Over the next 90 days, patient will not require hospitalization due to complications related to A-Fib.  Interventions:  Collaboration with [name]., MD regarding development and update of comprehensive plan of care as evidenced by provider attestation and co-signature Inter-disciplinary care team collaboration (see longitudinal plan of care) Reviewed medications and current treatment plan. Advised to continue taking medications as prescribed. Advised to notify the Cardiology team if unable to tolerate the prescribed regimen. Reports complying with treatment plan. She was scheduled to complete a Cardiology follow up on 12/26/20. The appointment has been rescheduled until mid October d/t possible inclement weather.  Dr. Rosanna Randy provided clearance for her dental procedure/tooth extraction and instructions for holding blood thinners during her visit today. She plans to schedule the procedure for October/November.  Thoroughly reviewed indications for seeking immediate medical attention.    Self-Care/Patient Goals: Take medications as prescribed Attend provider appointment as scheduled Assess symptoms daily Seek immediate medical attention for worsening/unrelieved symptoms Complete outreach with the Cardiology team as scheduled Contact the clinic with questions and new concerns if needed   Follow Up Plan:  Will follow up within the next month     Ms. Borneman verbalized understanding of the information discussed during the clinic visit. Declined need for printed instructions. A member of the care management team will follow up next month.   [name] Care Management [facility] [phone]

## 2020-12-25 NOTE — Chronic Care Management (AMB) (Signed)
Chronic Care Management   CCM RN Visit Note  12/25/2020 Name: Candace [name] MRN: [id number] DOB: 15-Jul-1950  Subjective: KEONDRA [name] is a 70 y.o. year old female who is a primary care patient of [name]., MD. The care management team was consulted for assistance with disease management and care coordination needs.    Engaged with patient face to face for follow up visit in response to provider referral for case management and care coordination services.   Consent to Services:  The patient was given information about Chronic Care Management services, agreed to services, and gave verbal consent prior to initiation of services.  Please see initial visit note for detailed documentation.   Assessment: Review of patient past medical history, allergies, medications, health status, including review of consultants reports, laboratory and other test data, was performed as part of comprehensive evaluation and provision of chronic care management services.   SDOH (Social Determinants of Health) assessments and interventions performed:  No  CCM Care Plan  Allergies  Allergen Reactions   Lisinopril Cough   Penicillins Swelling, Rash and Other (See Comments)    Did it involve swelling of the face/tongue/throat, SOB, or low BP? Yes Did it involve sudden or severe rash/hives, skin peeling, or any reaction on the inside of your mouth or nose? Yes Did you need to seek medical attention at a hospital or doctor's office? Yes When did it last happen?      15 years If all above answers are "NO", may proceed with cephalosporin use.     Outpatient Encounter Medications as of 12/25/2020  Medication Sig Note   acetaminophen (TYLENOL) 325 MG tablet Take 650 mg by mouth every 6 (six) hours as needed for moderate pain or headache.     bismuth subsalicylate (PEPTO BISMOL) 262 MG chewable tablet Chew 524 mg by mouth as needed.    clopidogrel (PLAVIX) 75 MG tablet Take 1 tablet (75 mg total) by  mouth daily.    ELIQUIS 5 MG TABS tablet Take 5 mg by mouth 2 (two) times daily. 06/06/2020: Prescribed by Dr. Nehemiah Massed   ezetimibe (ZETIA) 10 MG tablet Take 1 tablet (10 mg total) by mouth daily.    furosemide (LASIX) 20 MG tablet Take 20 mg by mouth.    glucose blood test strip Use to check blood sugars daily as instructed    insulin glargine (LANTUS SOLOSTAR) 100 UNIT/ML Solostar Pen Inject 10 Units into the skin at bedtime.    Insulin Pen Needle 32G X 4 MM MISC Use to inject insulin daily    isosorbide mononitrate (IMDUR) 120 MG 24 hr tablet Take 1 tablet (120 mg total) by mouth daily.    JARDIANCE 25 MG TABS tablet TAKE ONE TABLET BY MOUTH BEFORE BREAKFAST    Lancets (ONETOUCH DELICA PLUS [id number]) MISC USE UP TO 4 TIMES DAILY AS DIRECTED    loperamide (IMODIUM) 2 MG capsule Take 2 mg by mouth as needed.    losartan (COZAAR) 25 MG tablet TAKE ONE TABLET BY MOUTH EVERYDAY AT BEDTIME    metoprolol tartrate (LOPRESSOR) 100 MG tablet Take 100 mg by mouth 2 (two) times daily. 06/06/2020: Prescribed by Dr. Nehemiah Massed    nitroGLYCERIN (NITROSTAT) 0.4 MG SL tablet Place 1 tablet (0.4 mg total) under the tongue every 5 (five) minutes as needed for chest pain.    nystatin (NYSTATIN) powder Apply 1 application topically daily. (Patient taking differently: Apply 1 application topically daily as needed.)    ondansetron (ZOFRAN-ODT) 8  MG disintegrating tablet Take 1 tablet (8 mg total) by mouth every 8 (eight) hours as needed.    pantoprazole (PROTONIX) 20 MG tablet Take 1 tablet (20 mg total) by mouth 2 (two) times daily.    rosuvastatin (CRESTOR) 40 MG tablet Take 1 tablet (40 mg total) by mouth daily.    sertraline (ZOLOFT) 100 MG tablet Take 1 tablet (100 mg total) by mouth daily.    TRULICITY 1.5 IR/6.7EL SOPN Inject 1.5 mg into the skin once a week.    [DISCONTINUED] cetirizine (ZYRTEC) 5 MG tablet Take 2 tablets (10 mg total) by mouth daily.    No facility-administered encounter medications on file  as of 12/25/2020.    Patient Active Problem List   Diagnosis Date Noted   Vertigo    Unsteady gait    Acute bronchitis 03/07/2019   Esophageal dysphagia    Stomach irritation    Gastric polyp    Esophageal lump    Hx of colonic polyp    Polyp of colon    Diverticulosis of large intestine without diverticulitis    PSVT (paroxysmal supraventricular tachycardia) ([place]) 10/03/2018   Abnormal ECG 07/05/2018   Tachycardia 03/27/2018   Pain in limb 02/28/2018   Chronic diastolic CHF (congestive heart failure) ([place]) 11/03/2017   TIA (transient ischemic attack) 11/03/2017   COPD suggested by initial evaluation ([place]) 08/23/2017   Acute and chronic respiratory failure with hypoxia ([place]) 08/23/2017   Postoperative state 08/15/2017   Incidental pulmonary nodule 08/01/2017   Non-ST elevation myocardial infarction (NSTEMI), subendocardial infarction, subsequent episode of care ([place]) 06/01/2017   B12 deficiency 12/01/2016   Vitamin D deficiency 11/04/2016   Depression with anxiety 09/30/2016   AF (paroxysmal atrial fibrillation) ([place]) 09/30/2016   Resting tremor 09/30/2016   Mild obstructive sleep apnea 09/30/2016   Headache 08/18/2016   Unstable angina ([place]) 08/03/2016   Type 2 diabetes mellitus with complication, without long-term current use of insulin ([place]) 08/03/2016   Chronic daily headache 05/05/2016   Asthma 11/11/2015   S/P CABG x 4 11/10/2015   Coronary artery disease 11/07/2015   Carotid artery narrowing 02/08/2014   Chest pain 02/08/2014   Mixed hyperlipidemia 02/08/2014   Bilateral carotid artery stenosis 02/08/2014   Atypical chest pain 02/07/2014   Essential hypertension 02/07/2014   Awareness of heartbeats 02/07/2014   D (diarrhea) 10/05/2013   Gastroesophageal reflux disease 10/05/2013    Conditions to be addressed/monitored:Atrial Fibrillation and CHF Patient Care Plan: Heart Failure (Adult)     Problem Identified: Symptom Exacerbation (Heart Failure)    Priority: High  Onset Date: 04/15/2020     Long-Range Goal: Symptom Exacerbation Prevented or Minimized   Start Date: 09/09/2020  Expected End Date: 01/07/2021  Recent Progress: On track  Priority: High  Note:    Current Barriers:  Chronic Disease Management support and educational needs r/t CHF.  Case Manager Clinical Goal(s):  Over the next 120 days, patient will not require hospitalization or emergent evaluation d/t complications r/t CHF exacerbation.  Interventions:  Collaboration with [name]., MD regarding development and update of comprehensive plan of care as evidenced by provider attestation and co-signature Inter-disciplinary care team collaboration (see longitudinal plan of care) Reviewed medications and current plan for CHF management. Reports compliance with medications and nutritional intake. Report doing well with monitoring sodium and nutritional intake.  Reviewed weight parameters and indications for notifying a provider. Reports weights have remained stable. Thoroughly reviewed s/sx of fluid overload and indications for notifying a provider.  Reviewed s/sx of complications r/t CHF exacerbation and indications for seeking immediate medical attention.   Patient Goals/Self-Care Activities:  Take medications as prescribed Attend medical appointments as scheduled Follow plan for CHF symptom management and notify provider with concerns Monitor and record weights Adhere to recommended cardiac diet Contact clinic with questions and new concerns as needed    Follow Up Plan:  Will follow up within the next month    Patient Care Plan: Atrial Fibrillation (Adult)     Problem Identified: Dysrhythmia (Atrial Fibrillation)      Long-Range Goal: Heart Rate and Rhythm Monitored and Managed   Start Date: 12/05/2020  Expected End Date: 03/05/2021  Priority: High  Note:   Current Barriers:  Chronic Disease Management support and educational needs related to  A-Fib.  Case Manager Clinical Goal(s):  Over the next 90 days, patient will not require hospitalization due to complications related to A-Fib.  Interventions:  Collaboration with [name]., MD regarding development and update of comprehensive plan of care as evidenced by provider attestation and co-signature Inter-disciplinary care team collaboration (see longitudinal plan of care) Reviewed medications and current treatment plan. Advised to continue taking medications as prescribed. Advised to notify the Cardiology team if unable to tolerate the prescribed regimen. Reports complying with treatment plan. She was scheduled to complete a Cardiology follow up on 12/26/20. The appointment has been rescheduled until mid October d/t possible inclement weather.  Dr. Rosanna Randy provided clearance for her dental procedure/tooth extraction and instructions for holding blood thinners during her visit today. She plans to schedule the procedure for October/November.  Thoroughly reviewed indications for seeking immediate medical attention.    Self-Care/Patient Goals: Take medications as prescribed Attend provider appointment as scheduled Assess symptoms daily Seek immediate medical attention for worsening/unrelieved symptoms Complete outreach with the Cardiology team as scheduled Contact the clinic with questions and new concerns if needed   Follow Up Plan:  Will follow up within the next month     PLAN A member of the care management team will follow up next month.   [name] Care Management [facility] [phone]

## 2020-12-26 DIAGNOSIS — I5032 Chronic diastolic (congestive) heart failure: Secondary | ICD-10-CM

## 2020-12-26 DIAGNOSIS — I480 Paroxysmal atrial fibrillation: Secondary | ICD-10-CM | POA: Diagnosis not present

## 2020-12-26 DIAGNOSIS — E1142 Type 2 diabetes mellitus with diabetic polyneuropathy: Secondary | ICD-10-CM

## 2020-12-31 ENCOUNTER — Telehealth: Payer: Self-pay

## 2020-12-31 NOTE — Progress Notes (Addendum)
Chronic Care Management Pharmacy Assistant   Name: Candace [name]  MRN: [id number] DOB: 1951-01-08  Reason for Encounter:Diabetes Disease State Call.   Recent office visits:  12/25/2020 Dr.Gilbert MD (PCP)No Medication Changes noted, Return in about 4 months   Recent consult visits:  No recent [facility] visits:  None in previous 6 months  Medications: Outpatient Encounter Medications as of 12/31/2020  Medication Sig Note   acetaminophen (TYLENOL) 325 MG tablet Take 650 mg by mouth every 6 (six) hours as needed for moderate pain or headache.     bismuth subsalicylate (PEPTO BISMOL) 262 MG chewable tablet Chew 524 mg by mouth as needed.    clopidogrel (PLAVIX) 75 MG tablet Take 1 tablet (75 mg total) by mouth daily.    ELIQUIS 5 MG TABS tablet Take 5 mg by mouth 2 (two) times daily. 06/06/2020: Prescribed by Dr. Nehemiah Massed   ezetimibe (ZETIA) 10 MG tablet Take 1 tablet (10 mg total) by mouth daily.    furosemide (LASIX) 20 MG tablet Take 20 mg by mouth.    glucose blood test strip Use to check blood sugars daily as instructed    insulin glargine (LANTUS SOLOSTAR) 100 UNIT/ML Solostar Pen Inject 10 Units into the skin at bedtime.    Insulin Pen Needle 32G X 4 MM MISC Use to inject insulin daily    isosorbide mononitrate (IMDUR) 120 MG 24 hr tablet Take 1 tablet (120 mg total) by mouth daily.    JARDIANCE 25 MG TABS tablet TAKE ONE TABLET BY MOUTH BEFORE BREAKFAST    Lancets (ONETOUCH DELICA PLUS [id number]) MISC USE UP TO 4 TIMES DAILY AS DIRECTED    loperamide (IMODIUM) 2 MG capsule Take 2 mg by mouth as needed.    losartan (COZAAR) 25 MG tablet TAKE ONE TABLET BY MOUTH EVERYDAY AT BEDTIME    metoprolol tartrate (LOPRESSOR) 100 MG tablet Take 100 mg by mouth 2 (two) times daily. 06/06/2020: Prescribed by Dr. Nehemiah Massed    nitroGLYCERIN (NITROSTAT) 0.4 MG SL tablet Place 1 tablet (0.4 mg total) under the tongue every 5 (five) minutes as needed for chest pain.    nystatin  (NYSTATIN) powder Apply 1 application topically daily. (Patient taking differently: Apply 1 application topically daily as needed.)    ondansetron (ZOFRAN-ODT) 8 MG disintegrating tablet Take 1 tablet (8 mg total) by mouth every 8 (eight) hours as needed.    pantoprazole (PROTONIX) 20 MG tablet Take 1 tablet (20 mg total) by mouth 2 (two) times daily.    rosuvastatin (CRESTOR) 40 MG tablet Take 1 tablet (40 mg total) by mouth daily.    sertraline (ZOLOFT) 100 MG tablet Take 1 tablet (100 mg total) by mouth daily.    TRULICITY 1.5 NF/6.2ZH SOPN Inject 1.5 mg into the skin once a week.    [DISCONTINUED] cetirizine (ZYRTEC) 5 MG tablet Take 2 tablets (10 mg total) by mouth daily.    No facility-administered encounter medications on file as of 12/31/2020.   Care Gaps: COVID 19 Vaccine (4- Booster) Ophthalmology Exam (Last Completed 06/28/2019) Tetanus Vaccine (Last Completed 07/28/2010) Shingrix Vaccine (2 of 2) (Last Completed 06/14/2020) Foot Exam (Last Completed 10/27/2019) A1C was 10.7% on 12/02/20. [facility]: 11/18/2020 Rosuvastatin 40 mg last filled for a 90- Day supply at [facility] 11/18/2020 Losartan 25 mg last filled for a 30-Day supply at [facility] 11/18/2020 Jardiance 25 mg last filled for a 10-Day Supply at [facility] 08/65/7846 Trulicity 1.5 mg last filled for a  30-Day supply at [facility] Medication Fill Gaps: None ID  Recent Relevant Labs: Lab Results  Component Value Date/Time   HGBA1C 10.7 (H) 12/02/2020 04:06 PM   HGBA1C 9.1 (H) 08/11/2020 09:32 AM   HGBA1C 9.2 07/10/2019 12:00 AM   HGBA1C 8.3 (H) 07/14/2013 05:42 AM   MICROALBUR 50 08/11/2020 08:51 AM    Kidney Function Lab Results  Component Value Date/Time   CREATININE 0.65 12/04/2020 11:18 AM   CREATININE 0.93 12/02/2020 04:06 PM   CREATININE 1.39 (H) 03/24/2017 04:38 PM   CREATININE 0.78 04/29/2014 08:42 PM   CREATININE 0.79 02/03/2014 04:06 PM   GFRNONAA >60 12/04/2020  11:18 AM   GFRNONAA >60 04/29/2014 08:42 PM   GFRNONAA >60 09/16/2013 05:23 AM   GFRAA 108 02/06/2020 09:49 AM   GFRAA >60 04/29/2014 08:42 PM   GFRAA >60 09/16/2013 05:23 AM    Current antihyperglycemic regimen:  Trulicity 1.5 mg weekly on Tuesdays   Jardiance 25 mg daily  Lantus 10 units nightly  What recent interventions/DTPs have been made to improve glycemic control:  None ID Have there been any recent hospitalizations or ED visits since last visit with CPP? No Patient denies hypoglycemic symptoms, including Pale, Sweaty, Shaky, Hungry, Nervous/irritable, and Vision changes Patient denies hyperglycemic symptoms, including blurry vision, excessive thirst, fatigue, polyuria, and weakness Patient reports having symptoms of being drowsy.Patient states she started feeling drowsy when she started taking her Lantus. How often are you checking your blood sugar? once daily What are your blood sugars ranging?  Fasting:  On 12/31/2020 it was 164. On 12/30/2020 it was 174. On 12/29/2020 it was 197. On 12/28/2020 it was 200.  On 12/27/2020 it was 201.  During the week, how often does your blood glucose drop below 70? Never Are you checking your feet daily/regularly?   Patient denies any pain, numbness or tingling in her feet.  Patient states she has no appetite since starting Lantus.Patient states she may have two meals daily, and if she has snack she will not have her meal.Patient reports ,Today on 12/31/2020 all she has eat so far is pack of nabs,rasion and a small orange.Patient states she may have Yogurt, fruit for breakfast, and a small snack for lunch, and a meal at dinner.  Patient has question regarding her Lantus Solostar pen. Notified Clinical Pharmacist.   Completed acute form for ondansetron 8 mg PRN to be deliver on 01/05/2021.Form Sent to Clinical Pharmacist for review.    Adherence Review: Is the patient currently on a STATIN medication? Yes Is the patient currently on  ACE/ARB medication? Yes Does the patient have >5 day gap between last estimated fill dates? No   [place] Clinical Pharmacist Assistant [phone]   Addendum 01/02/21: Spoke with patient to clarify proper use and disposal of pen needle and pen. Will have patient increase Lantus to 12 units with HC follow-up next week.   Junius Argyle, PharmD, [place], [place] [phone]

## 2021-01-02 ENCOUNTER — Ambulatory Visit (INDEPENDENT_AMBULATORY_CARE_PROVIDER_SITE_OTHER): Payer: Medicare Other

## 2021-01-02 DIAGNOSIS — I5032 Chronic diastolic (congestive) heart failure: Secondary | ICD-10-CM

## 2021-01-02 DIAGNOSIS — E1165 Type 2 diabetes mellitus with hyperglycemia: Secondary | ICD-10-CM

## 2021-01-02 NOTE — Chronic Care Management (AMB) (Signed)
Chronic Care Management   CCM RN Visit Note  01/02/2021 Name: Candace [name] MRN: [id number] DOB: 07-26-1950  Subjective: Candace [name] is a 70 y.o. year old female who is a primary care patient of [name]., MD. The care management team was consulted for assistance with disease management and care coordination needs.    Returned call to patient. Engaged with patient by telephone for follow up visit in response to provider referral for case management and care coordination services.   Consent to Services:  The patient was given information about Chronic Care Management services, agreed to services, and gave verbal consent prior to initiation of services.  Please see initial visit note for detailed documentation.   Assessment: Review of patient past medical history, allergies, medications, health status, including review of consultants reports, laboratory and other test data, was performed as part of comprehensive evaluation and provision of chronic care management services.   SDOH (Social Determinants of Health) assessments and interventions performed:  No  CCM Care Plan  Allergies  Allergen Reactions   Lisinopril Cough   Penicillins Swelling, Rash and Other (See Comments)    Did it involve swelling of the face/tongue/throat, SOB, or low BP? Yes Did it involve sudden or severe rash/hives, skin peeling, or any reaction on the inside of your mouth or nose? Yes Did you need to seek medical attention at a hospital or doctor's office? Yes When did it last happen?      15 years If all above answers are "NO", may proceed with cephalosporin use.     Outpatient Encounter Medications as of 01/02/2021  Medication Sig Note   acetaminophen (TYLENOL) 325 MG tablet Take 650 mg by mouth every 6 (six) hours as needed for moderate pain or headache.     bismuth subsalicylate (PEPTO BISMOL) 262 MG chewable tablet Chew 524 mg by mouth as needed.    clopidogrel (PLAVIX) 75 MG tablet  Take 1 tablet (75 mg total) by mouth daily.    ELIQUIS 5 MG TABS tablet Take 5 mg by mouth 2 (two) times daily. 06/06/2020: Prescribed by Dr. Nehemiah Massed   ezetimibe (ZETIA) 10 MG tablet Take 1 tablet (10 mg total) by mouth daily.    furosemide (LASIX) 20 MG tablet Take 20 mg by mouth.    glucose blood test strip Use to check blood sugars daily as instructed    insulin glargine (LANTUS SOLOSTAR) 100 UNIT/ML Solostar Pen Inject 10 Units into the skin at bedtime.    Insulin Pen Needle 32G X 4 MM MISC Use to inject insulin daily    isosorbide mononitrate (IMDUR) 120 MG 24 hr tablet Take 1 tablet (120 mg total) by mouth daily.    JARDIANCE 25 MG TABS tablet TAKE ONE TABLET BY MOUTH BEFORE BREAKFAST    Lancets (ONETOUCH DELICA PLUS [id number]) MISC USE UP TO 4 TIMES DAILY AS DIRECTED    loperamide (IMODIUM) 2 MG capsule Take 2 mg by mouth as needed.    losartan (COZAAR) 25 MG tablet TAKE ONE TABLET BY MOUTH EVERYDAY AT BEDTIME    metoprolol tartrate (LOPRESSOR) 100 MG tablet Take 100 mg by mouth 2 (two) times daily. 06/06/2020: Prescribed by Dr. Nehemiah Massed    nitroGLYCERIN (NITROSTAT) 0.4 MG SL tablet Place 1 tablet (0.4 mg total) under the tongue every 5 (five) minutes as needed for chest pain.    nystatin (NYSTATIN) powder Apply 1 application topically daily. (Patient taking differently: Apply 1 application topically daily as needed.)  ondansetron (ZOFRAN-ODT) 8 MG disintegrating tablet Take 1 tablet (8 mg total) by mouth every 8 (eight) hours as needed.    pantoprazole (PROTONIX) 20 MG tablet Take 1 tablet (20 mg total) by mouth 2 (two) times daily.    rosuvastatin (CRESTOR) 40 MG tablet Take 1 tablet (40 mg total) by mouth daily.    sertraline (ZOLOFT) 100 MG tablet Take 1 tablet (100 mg total) by mouth daily.    TRULICITY 1.5 LH/7.3SK SOPN Inject 1.5 mg into the skin once a week.    [DISCONTINUED] cetirizine (ZYRTEC) 5 MG tablet Take 2 tablets (10 mg total) by mouth daily.    No  facility-administered encounter medications on file as of 01/02/2021.    Patient Active Problem List   Diagnosis Date Noted   Vertigo    Unsteady gait    Acute bronchitis 03/07/2019   Esophageal dysphagia    Stomach irritation    Gastric polyp    Esophageal lump    Hx of colonic polyp    Polyp of colon    Diverticulosis of large intestine without diverticulitis    PSVT (paroxysmal supraventricular tachycardia) ([place]) 10/03/2018   Abnormal ECG 07/05/2018   Tachycardia 03/27/2018   Pain in limb 02/28/2018   Chronic diastolic CHF (congestive heart failure) ([place]) 11/03/2017   TIA (transient ischemic attack) 11/03/2017   COPD suggested by initial evaluation ([place]) 08/23/2017   Acute and chronic respiratory failure with hypoxia ([place]) 08/23/2017   Postoperative state 08/15/2017   Incidental pulmonary nodule 08/01/2017   Non-ST elevation myocardial infarction (NSTEMI), subendocardial infarction, subsequent episode of care ([place]) 06/01/2017   B12 deficiency 12/01/2016   Vitamin D deficiency 11/04/2016   Depression with anxiety 09/30/2016   AF (paroxysmal atrial fibrillation) ([place]) 09/30/2016   Resting tremor 09/30/2016   Mild obstructive sleep apnea 09/30/2016   Headache 08/18/2016   Unstable angina ([place]) 08/03/2016   Type 2 diabetes mellitus with complication, without long-term current use of insulin ([place]) 08/03/2016   Chronic daily headache 05/05/2016   Asthma 11/11/2015   S/P CABG x 4 11/10/2015   Coronary artery disease 11/07/2015   Carotid artery narrowing 02/08/2014   Chest pain 02/08/2014   Mixed hyperlipidemia 02/08/2014   Bilateral carotid artery stenosis 02/08/2014   Atypical chest pain 02/07/2014   Essential hypertension 02/07/2014   Awareness of heartbeats 02/07/2014   D (diarrhea) 10/05/2013   Gastroesophageal reflux disease 10/05/2013    Conditions to be addressed/monitored:CHF and DMII Patient Care Plan: Diabetes Type 2 (Adult)     Problem Identified: Glycemic  Management (Diabetes, Type 2)   Priority: High  Onset Date: 04/15/2020     Long-Range Goal: Glycemic Management Optimized   Start Date: 11/21/2020  Expected End Date: 03/21/2021  Recent Progress: On track  Priority: High  Note:    Current Barriers:  Chronic Disease Management support and educational needs r/t Diabetes self management.   Case Manager Clinical Goal(s):  Over the next 120 days, patient will demonstrate improved adherence to prescribed treatment plan for Diabetes self management as evidenced by daily monitoring and recording of CBG, adherence to ADA/ carb modified diet and adherence to prescribed medication regimen.  Interventions:  Collaboration with Miguel Aschoff, MD regarding development and update of comprehensive plan of care as evidenced by provider attestation and co-signature Inter-disciplinary care team collaboration (see longitudinal plan of care) Reviewed recent blood glucose readings and compliance with current treatment plan. Discussed goal to maintain fasting readings below 150 mg/dl. Reports increasing insulin dose as advised and  consistently monitoring blood glucose. Reviewed blood glucose readings. Reports fasting readings are starting to improve. Fasting reading today was 145 mg/dl. Reviewed s/sx of hypoglycemia and hyperglycemia along with recommended interventions.  Discussed concerns regarding insulin injections sites. Reports experiencing skin irritation earlier this week along with sensation of "pins and needles" after injections. She has discussed concerns with the CCM Pharmacist. Advised to follow recommendations and clean the area to be injected prior to administering insulin. She is aware that she can use mild soap and water if she does not have alcohol available. Denies edema, discoloration or evidence of skin breakdown. She is aware of need to rotate injection sites.     Patient Goals/Self-Care Activities Self administer medications as  prescribed Attend all scheduled provider appointments Monitor blood glucose levels consistently and utilize recommended interventions Monitor closely for s/sx of hyperglycemia and seek medical attention if needed Adhere to prescribed ADA/carb modified Notify provider or care management team with questions and new concerns as needed.   Follow Up Plan:  Will follow up within the next month    Patient Care Plan: Heart Failure (Adult)     Problem Identified: Symptom Exacerbation (Heart Failure)   Priority: High  Onset Date: 04/15/2020     Long-Range Goal: Symptom Exacerbation Prevented or Minimized   Start Date: 09/09/2020  Expected End Date: 01/07/2021  Recent Progress: On track  Priority: High  Note:    Current Barriers:  Chronic Disease Management support and educational needs r/t CHF.  Case Manager Clinical Goal(s):  Over the next 120 days, patient will not require hospitalization or emergent evaluation d/t complications r/t CHF exacerbation.  Interventions:  Collaboration with [name]., MD regarding development and update of comprehensive plan of care as evidenced by provider attestation and co-signature Inter-disciplinary care team collaboration (see longitudinal plan of care) Reviewed medications and current plan for CHF management. Remains compliant with treatment plan. Weights remain stable. Reports weight today was 160 lbs. Report doing well with monitoring sodium and nutritional intake. She remains very motivated to improve her overall health.  Reviewed s/sx of fluid overload and indications for notifying a provider. Reviewed s/sx of complications r/t CHF exacerbation and indications for seeking immediate medical attention.   Patient Goals/Self-Care Activities:  Take medications as prescribed Attend medical appointments as scheduled Follow plan for CHF symptom management and notify provider with concerns Monitor and record weights Adhere to recommended  cardiac diet Contact clinic with questions and new concerns as needed    Follow Up Plan:  Will follow up within the next month     PLAN A member of the care management team will follow up next month.  [name] [facility] Care Management [facility] [phone]

## 2021-01-02 NOTE — Progress Notes (Signed)
Per Clinical Pharmacist,Please have patient increase Lantus to 12 units with HC follow-up next week.   Patient is asking is there something she can do to help with the pain after administering her insulin.Patient states after she gives herself her insulin she still has that "sticking feeling as I still giving myself the injection". Patient states how low does the clinical pharmacist wants her blood sugar readings to be? Patient reports her sugar reading was 145 on 01/02/2021 fasting.Notified Clinical Pharmacist.   Per Clinical Pharmacist, I would make sure she is changing up where she is injecting the insulin. Can be anywhere you can "Pinch an inch" of skin along the wasteline, or thighs. Alternate which side of the body, and which spot that you are giving it. She should also make sure she cleans the site with a little soap and water or an alcohol pad before injecting.  For now I want to see her fasting blood sugars are consistently less than 150.  Patient Verbalized understanding.  [place] Pharmacist Assistant [phone]

## 2021-01-02 NOTE — Patient Instructions (Addendum)
Thank you for allowing the Chronic Care Management team to participate in your care.    Patient Care Plan: Diabetes Type 2 (Adult)     Problem Identified: Glycemic Management (Diabetes, Type 2)   Priority: High  Onset Date: 04/15/2020     Long-Range Goal: Glycemic Management Optimized   Start Date: 11/21/2020  Expected End Date: 03/21/2021  Recent Progress: On track  Priority: High  Note:    Current Barriers:  Chronic Disease Management support and educational needs r/t Diabetes self management.   Case Manager Clinical Goal(s):  Over the next 120 days, patient will demonstrate improved adherence to prescribed treatment plan for Diabetes self management as evidenced by daily monitoring and recording of CBG, adherence to ADA/ carb modified diet and adherence to prescribed medication regimen.  Interventions:  Collaboration with Miguel Aschoff, MD regarding development and update of comprehensive plan of care as evidenced by provider attestation and co-signature Inter-disciplinary care team collaboration (see longitudinal plan of care) Reviewed recent blood glucose readings and compliance with current treatment plan. Discussed goal to maintain fasting readings below 150 mg/dl. Reports increasing insulin dose as advised and consistently monitoring blood glucose. Reviewed blood glucose readings. Reports fasting readings are starting to improve. Fasting reading today was 145 mg/dl. Reviewed s/sx of hypoglycemia and hyperglycemia along with recommended interventions.  Discussed concerns regarding insulin injections sites. Reports experiencing skin irritation earlier this week along with sensation of "pins and needles" after injections. She has discussed concerns with the CCM Pharmacist. Advised to follow recommendations and clean the area to be injected prior to administering insulin. She is aware that she can use mild soap and water if she does not have alcohol available. Denies edema,  discoloration or evidence of skin breakdown. She is aware of need to rotate injection sites.     Patient Goals/Self-Care Activities Self administer medications as prescribed Attend all scheduled provider appointments Monitor blood glucose levels consistently and utilize recommended interventions Monitor closely for s/sx of hyperglycemia and seek medical attention if needed Adhere to prescribed ADA/carb modified Notify provider or care management team with questions and new concerns as needed.   Follow Up Plan:  Will follow up within the next month    Patient Care Plan: Heart Failure (Adult)     Problem Identified: Symptom Exacerbation (Heart Failure)   Priority: High  Onset Date: 04/15/2020     Long-Range Goal: Symptom Exacerbation Prevented or Minimized   Start Date: 09/09/2020  Expected End Date: 01/07/2021  Recent Progress: On track  Priority: High  Note:    Current Barriers:  Chronic Disease Management support and educational needs r/t CHF.  Case Manager Clinical Goal(s):  Over the next 120 days, patient will not require hospitalization or emergent evaluation d/t complications r/t CHF exacerbation.  Interventions:  Collaboration with [name]., MD regarding development and update of comprehensive plan of care as evidenced by provider attestation and co-signature Inter-disciplinary care team collaboration (see longitudinal plan of care) Reviewed medications and current plan for CHF management. Remains compliant with treatment plan. Weights remain stable. Reports weight today was 160 lbs. Report doing well with monitoring sodium and nutritional intake. She remains very motivated to improve her overall health.  Reviewed s/sx of fluid overload and indications for notifying a provider. Reviewed s/sx of complications r/t CHF exacerbation and indications for seeking immediate medical attention.   Patient Goals/Self-Care Activities:  Take medications as  prescribed Attend medical appointments as scheduled Follow plan for CHF symptom management and notify provider  with concerns Monitor and record weights Adhere to recommended cardiac diet Contact clinic with questions and new concerns as needed    Follow Up Plan:  Will follow up within the next month     Candace [name] verbalized understanding of the information discussed during the telephonic outreach. Declined need for mailed/printed instructions. A member of the care management team will follow up next month.  Candace [name] [facility] Care Management [facility] [phone]

## 2021-01-05 MED ORDER — ONDANSETRON 8 MG PO TBDP: 8.0000 mg | ORAL_TABLET | Freq: Three times a day (TID) | ORAL | 1 refills | Status: DC | PRN

## 2021-01-05 NOTE — Addendum Note (Signed)
Addended by: Daron Offer A on: 01/05/2021 09:13 AM   Modules accepted: Orders

## 2021-01-07 ENCOUNTER — Ambulatory Visit (INDEPENDENT_AMBULATORY_CARE_PROVIDER_SITE_OTHER): Payer: Medicare Other | Admitting: Family Medicine

## 2021-01-07 ENCOUNTER — Ambulatory Visit: Payer: Self-pay

## 2021-01-07 ENCOUNTER — Ambulatory Visit: Payer: Self-pay | Admitting: *Deleted

## 2021-01-07 ENCOUNTER — Telehealth: Payer: Self-pay

## 2021-01-07 DIAGNOSIS — R051 Acute cough: Secondary | ICD-10-CM

## 2021-01-07 DIAGNOSIS — E118 Type 2 diabetes mellitus with unspecified complications: Secondary | ICD-10-CM

## 2021-01-07 DIAGNOSIS — H66001 Acute suppurative otitis media without spontaneous rupture of ear drum, right ear: Secondary | ICD-10-CM | POA: Diagnosis not present

## 2021-01-07 DIAGNOSIS — I25110 Atherosclerotic heart disease of native coronary artery with unstable angina pectoris: Secondary | ICD-10-CM

## 2021-01-07 DIAGNOSIS — I10 Essential (primary) hypertension: Secondary | ICD-10-CM | POA: Diagnosis not present

## 2021-01-07 DIAGNOSIS — Z951 Presence of aortocoronary bypass graft: Secondary | ICD-10-CM

## 2021-01-07 DIAGNOSIS — H9201 Otalgia, right ear: Secondary | ICD-10-CM

## 2021-01-07 DIAGNOSIS — I5032 Chronic diastolic (congestive) heart failure: Secondary | ICD-10-CM

## 2021-01-07 MED ORDER — DOXYCYCLINE HYCLATE 100 MG PO TABS
100.0000 mg | ORAL_TABLET | Freq: Two times a day (BID) | ORAL | 1 refills | Status: DC
Start: 2021-01-07 — End: 2021-01-12

## 2021-01-07 MED ORDER — ALBUTEROL SULFATE HFA 108 (90 BASE) MCG/ACT IN AERS: 2.0000 | INHALATION_SPRAY | Freq: Four times a day (QID) | RESPIRATORY_TRACT | 2 refills | Status: AC | PRN

## 2021-01-07 NOTE — Progress Notes (Signed)
Virtual telephone visit    Virtual Visit via Telephone Note   This visit type was conducted due to national recommendations for restrictions regarding the COVID-19 Pandemic (e.g. social distancing) in an effort to limit this patient's exposure and mitigate transmission in our community. Due to her co-morbid illnesses, this patient is at least at moderate risk for complications without adequate follow up. This format is felt to be most appropriate for this patient at this time. The patient did not have access to video technology or had technical difficulties with video requiring transitioning to audio format only (telephone). Physical exam was limited to content and character of the telephone converstion.    Patient location: home Provider location: office  I discussed the limitations of evaluation and management by telemedicine and the availability of in person appointments. The patient expressed understanding and agreed to proceed.   Visit Date: 01/07/2021  Today's healthcare provider: [name], MD   No chief complaint on file.  Subjective    HPI  Patient is in today for evaluation of cold like symptoms.  She states that she began having symptoms 5 days ago.  Her symptoms began with sore throat, ear pain and cough.  In the beginning her cough was productive but now is not.   She has been using Mucinex, Tylenol, Ibuprofen and robitussin with honey.   Patient 68-year-old great granddaughter had a URI and ear infection a week ago.  5 days ago the patient developed a cough followed by sore throat sneezing and diarrhea now she has a cough and right ear pain.  She is no longer running fevers and the cough is improving but still keeps her awake at night. Medications: Outpatient Medications Prior to Visit  Medication Sig   acetaminophen (TYLENOL) 325 MG tablet Take 650 mg by mouth every 6 (six) hours as needed for moderate pain or headache.    bismuth subsalicylate (PEPTO  BISMOL) 262 MG chewable tablet Chew 524 mg by mouth as needed.   clopidogrel (PLAVIX) 75 MG tablet Take 1 tablet (75 mg total) by mouth daily.   ELIQUIS 5 MG TABS tablet Take 5 mg by mouth 2 (two) times daily.   ezetimibe (ZETIA) 10 MG tablet Take 1 tablet (10 mg total) by mouth daily.   furosemide (LASIX) 20 MG tablet Take 20 mg by mouth.   glucose blood test strip Use to check blood sugars daily as instructed   insulin glargine (LANTUS SOLOSTAR) 100 UNIT/ML Solostar Pen Inject 10 Units into the skin at bedtime.   Insulin Pen Needle 32G X 4 MM MISC Use to inject insulin daily   isosorbide mononitrate (IMDUR) 120 MG 24 hr tablet Take 1 tablet (120 mg total) by mouth daily.   JARDIANCE 25 MG TABS tablet TAKE ONE TABLET BY MOUTH BEFORE BREAKFAST   Lancets (ONETOUCH DELICA PLUS [id number]) MISC USE UP TO 4 TIMES DAILY AS DIRECTED   loperamide (IMODIUM) 2 MG capsule Take 2 mg by mouth as needed.   losartan (COZAAR) 25 MG tablet TAKE ONE TABLET BY MOUTH EVERYDAY AT BEDTIME   metoprolol tartrate (LOPRESSOR) 100 MG tablet Take 100 mg by mouth 2 (two) times daily.   nitroGLYCERIN (NITROSTAT) 0.4 MG SL tablet Place 1 tablet (0.4 mg total) under the tongue every 5 (five) minutes as needed for chest pain.   nystatin (NYSTATIN) powder Apply 1 application topically daily. (Patient taking differently: Apply 1 application topically daily as needed.)   ondansetron (ZOFRAN-ODT) 8 MG disintegrating tablet Take  1 tablet (8 mg total) by mouth every 8 (eight) hours as needed.   pantoprazole (PROTONIX) 20 MG tablet Take 1 tablet (20 mg total) by mouth 2 (two) times daily.   rosuvastatin (CRESTOR) 40 MG tablet Take 1 tablet (40 mg total) by mouth daily.   sertraline (ZOLOFT) 100 MG tablet Take 1 tablet (100 mg total) by mouth daily.   TRULICITY 1.5 ZT/2.4PY SOPN Inject 1.5 mg into the skin once a week.   No facility-administered medications prior to visit.    Review of Systems  Constitutional:  Positive for  chills, diaphoresis, fatigue and fever.  HENT:  Positive for congestion, ear pain, postnasal drip, rhinorrhea, sinus pressure, sinus pain, sneezing and sore throat. Negative for ear discharge and tinnitus.   Respiratory:  Positive for cough. Negative for shortness of breath and wheezing.   Cardiovascular:  Positive for chest pain (from cough).  Neurological:  Positive for headaches.      Objective    There were no vitals taken for this visit. BP Readings from Last 3 Encounters:  12/25/20 114/72  12/04/20 132/66  12/02/20 96/62   Wt Readings from Last 3 Encounters:  12/25/20 164 lb (74.4 kg)  10/04/20 167 lb (75.8 kg)  09/24/20 167 lb (75.8 kg)        Assessment & Plan     1. Right ear pain   2. Non-recurrent acute suppurative otitis media of right ear without spontaneous rupture of tympanic membrane Treat right ear pain as otitis media with doxycycline for 5 days.  3. Acute cough Cough secondary to URI.  I thought about prednisone but after discussing it with patient we will avoid this to avoid the hyperglycemia of her poorly controlled diabetes.  Use albuterol MDI.  4. Essential hypertension   5. Chronic diastolic CHF (congestive heart failure) [facility]) Patient feels well regarding heart disease presently  6. Coronary artery disease involving native coronary artery of native heart with unstable angina pectoris ([place]) All risk factors treated  7. Type 2 diabetes mellitus with complication, without long-term current use of insulin (HCC) Last A1c 10.7  8. S/P CABG x 4    No follow-ups on file.    I discussed the assessment and treatment plan with the patient. The patient was provided an opportunity to ask questions and all were answered. The patient agreed with the plan and demonstrated an understanding of the instructions.   The patient was advised to call back or seek an in-person evaluation if the symptoms worsen or if the condition fails to improve as  anticipated.  I provided 12 minutes of non-face-to-face time during this encounter.  I, [name], MD, have reviewed all documentation for this visit. The documentation on 01/07/21 for the exam, diagnosis, procedures, and orders are all accurate and complete.   Wilbern Pennypacker Cranford Mon, MD [facility] [phone] (phone) [phone] (fax)  [place]

## 2021-01-07 NOTE — Telephone Encounter (Signed)
C/o sore throat, chills, headache, dry constant cough, earache right ear. Sx started 01/03/21 and had fever, and diarrhea Saturday and Sunday but now resolved. Patient reports she kept great granddaughter and was dx with upper respiratory infection. Now patient with similar sx. Denies fever, difficulty breathing . C/o chest pain due to cough. Telephone visit scheduled for today . Recommended patient to do at home covid test prior to VV. Care advise given. Patient verbalize understanding of care advise .

## 2021-01-07 NOTE — Progress Notes (Signed)
Chronic Care Management Pharmacy Assistant   Name: Candace [name]  MRN: [id number] DOB: 1950/08/19  Reason for Encounter:Diabetes Disease State Call.  Recent office visits:  01/02/2021 Neldon Labella RN (CCM)  Recent consult visits:  No recent [facility] visits:  None in previous 6 months  Medications: Outpatient Encounter Medications as of 01/07/2021  Medication Sig Note   acetaminophen (TYLENOL) 325 MG tablet Take 650 mg by mouth every 6 (six) hours as needed for moderate pain or headache.     bismuth subsalicylate (PEPTO BISMOL) 262 MG chewable tablet Chew 524 mg by mouth as needed.    clopidogrel (PLAVIX) 75 MG tablet Take 1 tablet (75 mg total) by mouth daily.    ELIQUIS 5 MG TABS tablet Take 5 mg by mouth 2 (two) times daily. 06/06/2020: Prescribed by Dr. Nehemiah Massed   ezetimibe (ZETIA) 10 MG tablet Take 1 tablet (10 mg total) by mouth daily.    furosemide (LASIX) 20 MG tablet Take 20 mg by mouth.    glucose blood test strip Use to check blood sugars daily as instructed    insulin glargine (LANTUS SOLOSTAR) 100 UNIT/ML Solostar Pen Inject 10 Units into the skin at bedtime.    Insulin Pen Needle 32G X 4 MM MISC Use to inject insulin daily    isosorbide mononitrate (IMDUR) 120 MG 24 hr tablet Take 1 tablet (120 mg total) by mouth daily.    JARDIANCE 25 MG TABS tablet TAKE ONE TABLET BY MOUTH BEFORE BREAKFAST    Lancets (ONETOUCH DELICA PLUS [id number]) MISC USE UP TO 4 TIMES DAILY AS DIRECTED    loperamide (IMODIUM) 2 MG capsule Take 2 mg by mouth as needed.    losartan (COZAAR) 25 MG tablet TAKE ONE TABLET BY MOUTH EVERYDAY AT BEDTIME    metoprolol tartrate (LOPRESSOR) 100 MG tablet Take 100 mg by mouth 2 (two) times daily. 06/06/2020: Prescribed by Dr. Nehemiah Massed    nitroGLYCERIN (NITROSTAT) 0.4 MG SL tablet Place 1 tablet (0.4 mg total) under the tongue every 5 (five) minutes as needed for chest pain.    nystatin (NYSTATIN) powder Apply 1 application topically  daily. (Patient taking differently: Apply 1 application topically daily as needed.)    ondansetron (ZOFRAN-ODT) 8 MG disintegrating tablet Take 1 tablet (8 mg total) by mouth every 8 (eight) hours as needed.    pantoprazole (PROTONIX) 20 MG tablet Take 1 tablet (20 mg total) by mouth 2 (two) times daily.    rosuvastatin (CRESTOR) 40 MG tablet Take 1 tablet (40 mg total) by mouth daily.    sertraline (ZOLOFT) 100 MG tablet Take 1 tablet (100 mg total) by mouth daily.    TRULICITY 1.5 YK/5.9DJ SOPN Inject 1.5 mg into the skin once a week.    [DISCONTINUED] cetirizine (ZYRTEC) 5 MG tablet Take 2 tablets (10 mg total) by mouth daily.    No facility-administered encounter medications on file as of 01/07/2021.    Care Gaps: COVID 19 Vaccine (4- Booster) Ophthalmology Exam (Last Completed 06/28/2019) Tetanus Vaccine (Last Completed 07/28/2010) Shingrix Vaccine (2 of 2) (Last Completed 06/14/2020) Foot Exam (Last Completed 10/27/2019) A1C was 10.7% on 12/02/20. [facility]: 11/18/2020 Rosuvastatin 40 mg last filled for a 90- Day supply at [facility] 11/18/2020 Losartan 25 mg last filled for a 30-Day supply at [facility] 11/18/2020 Jardiance 25 mg last filled for a 90-Day Supply at [facility] 57/03/7791 Trulicity 1.5 mg last filled for a 30-Day supply at [facility] Medication Fill Gaps:  None ID   Recent Relevant Labs: Lab Results  Component Value Date/Time   HGBA1C 10.7 (H) 12/02/2020 04:06 PM   HGBA1C 9.1 (H) 08/11/2020 09:32 AM   HGBA1C 9.2 07/10/2019 12:00 AM   HGBA1C 8.3 (H) 07/14/2013 05:42 AM   MICROALBUR 50 08/11/2020 08:51 AM    Kidney Function Lab Results  Component Value Date/Time   CREATININE 0.65 12/04/2020 11:18 AM   CREATININE 0.93 12/02/2020 04:06 PM   CREATININE 1.39 (H) 03/24/2017 04:38 PM   CREATININE 0.78 04/29/2014 08:42 PM   CREATININE 0.79 02/03/2014 04:06 PM   GFRNONAA >60 12/04/2020 11:18 AM   GFRNONAA >60 04/29/2014 08:42 PM    GFRNONAA >60 09/16/2013 05:23 AM   GFRAA 108 02/06/2020 09:49 AM   GFRAA >60 04/29/2014 08:42 PM   GFRAA >60 09/16/2013 05:23 AM   Current antihyperglycemic regimen:  Trulicity 1.5 mg weekly on Tuesdays   Jardiance 25 mg daily  Lantus 12 units nightly  Patient denies hypoglycemic symptoms, including Pale, Sweaty, Shaky, Hungry, Nervous/irritable, and Vision changes Patient denies hyperglycemic symptoms, including blurry vision, excessive thirst, fatigue, polyuria, and weakness How often are you checking your blood sugar? once daily What are your blood sugars ranging?  Fasting: (Patient states she has not check it yet today on 01/07/2021) On 01/06/2021 it was 155. On 01/05/2021 it was 185. On 01/04/2021 it was 132. On 01/03/2021 it was 123. On 01/02/2021 it was 145. On 01/01/2021 it was 221. During the week, how often does your blood glucose drop below 70? Never  Patient states she is currently sick with a cough,congestion,ear ache, body ache and sore throat.Patient denies any fevers at this time.Patient reports she was taking care of her grandchild last week who was sick, and believes she caught a cold from her.Patient states she started feeling sick since this past Saturday 01/03/2021, but she will reach out to her PCP tomorrow or by Friday if she does not start feeling better.Patient reports she is taking Mucinex, Robitussin with honey and tylenol to help with her symptoms.  Patient reports her appetite is improving.Patient states she has notice she is wanting to eat more then before.  Patient states she has enough Insulin on hand until  next Friday 01/16/2021.Inform patient when I reach out to her on Wednesday 01/14/2021, I can put in acute refill request to get her insulin refill and sent to her.Patient Verbalized understanding.  [place] Pharmacist Assistant [phone]

## 2021-01-07 NOTE — Telephone Encounter (Signed)
Reason for Disposition  [1] Continuous (nonstop) coughing interferes with work or school AND [2] no improvement using cough treatment per Care Advice  Answer Assessment - Initial Assessment Questions 1. COVID-19 DIAGNOSIS: "Who made your COVID-19 diagnosis?" "Was it confirmed by a positive lab test or self-test?" If not diagnosed by a doctor (or NP/PA), ask "Are there lots of cases (community spread) where you live?" Note: See public health department website, if unsure.     Has not been tested  2. COVID-19 EXPOSURE: "Was there any known exposure to COVID before the symptoms began?" CDC Definition of close contact: within 6 feet (2 meters) for a total of 15 minutes or more over a 24-hour period.      Exposed to great granddaughter with upper resp. infection 3. ONSET: "When did the COVID-19 symptoms start?"      01/03/21 4. WORST SYMPTOM: "What is your worst symptom?" (e.g., cough, fever, shortness of breath, muscle aches)     Constant cough  5. COUGH: "Do you have a cough?" If Yes, ask: "How bad is the cough?"       Dry cough  6. FEVER: "Do you have a fever?" If Yes, ask: "What is your temperature, how was it measured, and when did it start?"     na 7. RESPIRATORY STATUS: "Describe your breathing?" (e.g., shortness of breath, wheezing, unable to speak)      na 8. BETTER-SAME-WORSE: "Are you getting better, staying the same or getting worse compared to yesterday?"  If getting worse, ask, "In what way?"     Worse  9. HIGH RISK DISEASE: "Do you have any chronic medical problems?" (e.g., asthma, heart or lung disease, weak immune system, obesity, etc.)     Heart disease and diabetes  10. VACCINE: "Have you had the COVID-19 vaccine?" If Yes, ask: "Which one, how many shots, when did you get it?"       X2 Moderna  11. BOOSTER: "Have you received your COVID-19 booster?" If Yes, ask: "Which one and when did you get it?"       1 moderna  12. PREGNANCY: "Is there any chance you are pregnant?" "When  was your last menstrual period?"       na 13. OTHER SYMPTOMS: "Do you have any other symptoms?"  (e.g., chills, fatigue, headache, loss of smell or taste, muscle pain, sore throat)       Chills, headache, sore throat, loss taste and smell 14. O2 SATURATION MONITOR:  "Do you use an oxygen saturation monitor (pulse oximeter) at home?" If Yes, ask "What is your reading (oxygen level) today?" "What is your usual oxygen saturation reading?" (e.g., 95%)       na  Protocols used: Coronavirus (COVID-19) Diagnosed or Suspected-A-AH

## 2021-01-07 NOTE — Patient Instructions (Addendum)
Thank you for allowing the Chronic Care Management team to participate in your care.    Patient Care Plan: Diabetes Type 2 (Adult)     Problem Identified: Glycemic Management (Diabetes, Type 2)   Priority: High  Onset Date: 04/15/2020     Long-Range Goal: Glycemic Management Optimized   Start Date: 11/21/2020  Expected End Date: 03/21/2021  Recent Progress: On track  Priority: High  Note:    Current Barriers:  Chronic Disease Management support and educational needs r/t Diabetes self management.   Case Manager Clinical Goal(s):  Over the next 120 days, patient will demonstrate improved adherence to prescribed treatment plan for Diabetes self management as evidenced by daily monitoring and recording of CBG, adherence to ADA/ carb modified diet and adherence to prescribed medication regimen.  Interventions:  Collaboration with Miguel Aschoff, MD regarding development and update of comprehensive plan of care as evidenced by provider attestation and co-signature Inter-disciplinary care team collaboration (see longitudinal plan of care) Reviewed recent blood glucose readings and compliance with current treatment plan. Discussed goal to maintain fasting readings below 150 mg/dl. Reports increasing insulin dose as advised and consistently monitoring blood glucose. Reviewed blood glucose readings. Reports changes in fasting readings r/t intake and viral illness. Reports fasting reading of 178 mg/dl today. Reviewed s/sx of hypoglycemia and hyperglycemia along with recommended interventions. Advised to continue monitoring and recording readings. Advised to continue monitoring nutritional intake.     Patient Goals/Self-Care Activities Self administer medications as prescribed Attend all scheduled provider appointments Monitor blood glucose levels consistently and utilize recommended interventions Monitor closely for s/sx of hyperglycemia and seek medical attention if needed Adhere to  prescribed ADA/carb modified Notify provider or care management team with questions and new concerns as needed.   Follow Up Plan:  Will follow up within the next month    Patient Care Plan: Heart Failure (Adult)     Problem Identified: Symptom Exacerbation (Heart Failure)   Priority: High  Onset Date: 04/15/2020     Long-Range Goal: Symptom Exacerbation Prevented or Minimized   Start Date: 01/07/2021  Expected End Date: 04/07/2021  Recent Progress: On track  Priority: High  Note:    Current Barriers:  Chronic Disease Management support and educational needs r/t CHF.  Case Manager Clinical Goal(s):  Over the next 120 days, patient will not require hospitalization or emergent evaluation d/t complications r/t CHF exacerbation.  Interventions:  Collaboration with [name]., MD regarding development and update of comprehensive plan of care as evidenced by provider attestation and co-signature Inter-disciplinary care team collaboration (see longitudinal plan of care) Reviewed medications and current plan for CHF management. Remains compliant with treatment plan. Weights remain stable. Called to reports increased fatigue and cough. She denies chest discomfort, palpitations or shortness of breath. She completed a virtual visit earlier today. Symptoms were related to a viral respiratory illness. Albuterol inhaler and antibiotics were prescribed. Advised to obtain new medications and take as prescribed.  Reviewed s/sx of fluid overload and indications for notifying a provider. Reviewed s/sx of complications r/t CHF exacerbation and indications for seeking immediate medical attention.   Patient Goals/Self-Care Activities:  Take medications as prescribed Attend medical appointments as scheduled Follow plan for CHF symptom management and notify provider with concerns Monitor and record weights Adhere to recommended cardiac diet Contact clinic with questions and new concerns  as needed    Follow Up Plan:  Will follow up within the next month      Ms. Bessinger verbalized  understanding of the information discussed during the telephonic outreach. Declined need for mailed/printed instructions. A member of the care management team will follow up within the next month.   [name] Care Management [facility] [phone]

## 2021-01-07 NOTE — Chronic Care Management (AMB) (Signed)
Chronic Care Management   CCM RN Visit Note  01/07/2021 Name: Candace [name] MRN: [id number] DOB: Jun 08, 1950  Subjective: Candace [name] is a 70 y.o. year old female who is a primary care patient of [name]., MD. The care management team was consulted for assistance with disease management and care coordination needs.    Patient called regarding cough and fatigue. Engaged with patient by telephone for follow up visit in response to provider referral for case management and care coordination services.   Consent to Services:  The patient was given information about Chronic Care Management services, agreed to services, and gave verbal consent prior to initiation of services.  Please see initial visit note for detailed documentation.    Assessment: Review of patient past medical history, allergies, medications, health status, including review of consultants reports, laboratory and other test data, was performed as part of comprehensive evaluation and provision of chronic care management services.   SDOH (Social Determinants of Health) assessments and interventions performed:  No  CCM Care Plan  Allergies  Allergen Reactions   Lisinopril Cough   Penicillins Swelling, Rash and Other (See Comments)    Did it involve swelling of the face/tongue/throat, SOB, or low BP? Yes Did it involve sudden or severe rash/hives, skin peeling, or any reaction on the inside of your mouth or nose? Yes Did you need to seek medical attention at a hospital or doctor's office? Yes When did it last happen?      15 years If all above answers are "NO", may proceed with cephalosporin use.     Outpatient Encounter Medications as of 01/07/2021  Medication Sig Note   acetaminophen (TYLENOL) 325 MG tablet Take 650 mg by mouth every 6 (six) hours as needed for moderate pain or headache.     albuterol (VENTOLIN HFA) 108 (90 Base) MCG/ACT inhaler Inhale 2 puffs into the lungs every 6 (six) hours as  needed for wheezing or shortness of breath.    bismuth subsalicylate (PEPTO BISMOL) 262 MG chewable tablet Chew 524 mg by mouth as needed.    clopidogrel (PLAVIX) 75 MG tablet Take 1 tablet (75 mg total) by mouth daily.    doxycycline (VIBRA-TABS) 100 MG tablet Take 1 tablet (100 mg total) by mouth 2 (two) times daily.    ELIQUIS 5 MG TABS tablet Take 5 mg by mouth 2 (two) times daily. 06/06/2020: Prescribed by Dr. Nehemiah Massed   ezetimibe (ZETIA) 10 MG tablet Take 1 tablet (10 mg total) by mouth daily.    furosemide (LASIX) 20 MG tablet Take 20 mg by mouth.    glucose blood test strip Use to check blood sugars daily as instructed    insulin glargine (LANTUS SOLOSTAR) 100 UNIT/ML Solostar Pen Inject 10 Units into the skin at bedtime.    Insulin Pen Needle 32G X 4 MM MISC Use to inject insulin daily    isosorbide mononitrate (IMDUR) 120 MG 24 hr tablet Take 1 tablet (120 mg total) by mouth daily.    JARDIANCE 25 MG TABS tablet TAKE ONE TABLET BY MOUTH BEFORE BREAKFAST    Lancets (ONETOUCH DELICA PLUS [id number]) MISC USE UP TO 4 TIMES DAILY AS DIRECTED    loperamide (IMODIUM) 2 MG capsule Take 2 mg by mouth as needed.    losartan (COZAAR) 25 MG tablet TAKE ONE TABLET BY MOUTH EVERYDAY AT BEDTIME    metoprolol tartrate (LOPRESSOR) 100 MG tablet Take 100 mg by mouth 2 (two) times daily. 06/06/2020: Prescribed by Dr.  Kowalski    nitroGLYCERIN (NITROSTAT) 0.4 MG SL tablet Place 1 tablet (0.4 mg total) under the tongue every 5 (five) minutes as needed for chest pain.    nystatin (NYSTATIN) powder Apply 1 application topically daily. (Patient taking differently: Apply 1 application topically daily as needed.)    ondansetron (ZOFRAN-ODT) 8 MG disintegrating tablet Take 1 tablet (8 mg total) by mouth every 8 (eight) hours as needed.    pantoprazole (PROTONIX) 20 MG tablet Take 1 tablet (20 mg total) by mouth 2 (two) times daily.    rosuvastatin (CRESTOR) 40 MG tablet Take 1 tablet (40 mg total) by mouth daily.     sertraline (ZOLOFT) 100 MG tablet Take 1 tablet (100 mg total) by mouth daily.    TRULICITY 1.5 IZ/1.2WP SOPN Inject 1.5 mg into the skin once a week.    [DISCONTINUED] cetirizine (ZYRTEC) 5 MG tablet Take 2 tablets (10 mg total) by mouth daily.    No facility-administered encounter medications on file as of 01/07/2021.    Patient Active Problem List   Diagnosis Date Noted   Vertigo    Unsteady gait    Acute bronchitis 03/07/2019   Esophageal dysphagia    Stomach irritation    Gastric polyp    Esophageal lump    Hx of colonic polyp    Polyp of colon    Diverticulosis of large intestine without diverticulitis    PSVT (paroxysmal supraventricular tachycardia) ([place]) 10/03/2018   Abnormal ECG 07/05/2018   Tachycardia 03/27/2018   Pain in limb 02/28/2018   Chronic diastolic CHF (congestive heart failure) ([place]) 11/03/2017   TIA (transient ischemic attack) 11/03/2017   COPD suggested by initial evaluation ([place]) 08/23/2017   Acute and chronic respiratory failure with hypoxia ([place]) 08/23/2017   Postoperative state 08/15/2017   Incidental pulmonary nodule 08/01/2017   Non-ST elevation myocardial infarction (NSTEMI), subendocardial infarction, subsequent episode of care ([place]) 06/01/2017   B12 deficiency 12/01/2016   Vitamin D deficiency 11/04/2016   Depression with anxiety 09/30/2016   AF (paroxysmal atrial fibrillation) ([place]) 09/30/2016   Resting tremor 09/30/2016   Mild obstructive sleep apnea 09/30/2016   Headache 08/18/2016   Unstable angina ([place]) 08/03/2016   Type 2 diabetes mellitus with complication, without long-term current use of insulin ([place]) 08/03/2016   Chronic daily headache 05/05/2016   Asthma 11/11/2015   S/P CABG x 4 11/10/2015   Coronary artery disease 11/07/2015   Carotid artery narrowing 02/08/2014   Chest pain 02/08/2014   Mixed hyperlipidemia 02/08/2014   Bilateral carotid artery stenosis 02/08/2014   Atypical chest pain 02/07/2014   Essential  hypertension 02/07/2014   Awareness of heartbeats 02/07/2014   D (diarrhea) 10/05/2013   Gastroesophageal reflux disease 10/05/2013    Conditions to be addressed/monitored:CHF and DMII  Patient Care Plan: Diabetes Type 2 (Adult)     Problem Identified: Glycemic Management (Diabetes, Type 2)   Priority: High  Onset Date: 04/15/2020     Long-Range Goal: Glycemic Management Optimized   Start Date: 11/21/2020  Expected End Date: 03/21/2021  Recent Progress: On track  Priority: High  Note:    Current Barriers:  Chronic Disease Management support and educational needs r/t Diabetes self management.   Case Manager Clinical Goal(s):  Over the next 120 days, patient will demonstrate improved adherence to prescribed treatment plan for Diabetes self management as evidenced by daily monitoring and recording of CBG, adherence to ADA/ carb modified diet and adherence to prescribed medication regimen.  Interventions:  Collaboration with Miguel Aschoff,  MD regarding development and update of comprehensive plan of care as evidenced by provider attestation and co-signature Inter-disciplinary care team collaboration (see longitudinal plan of care) Reviewed recent blood glucose readings and compliance with current treatment plan. Discussed goal to maintain fasting readings below 150 mg/dl. Reports increasing insulin dose as advised and consistently monitoring blood glucose. Reviewed blood glucose readings. Reports changes in fasting readings r/t intake and viral illness. Reports fasting reading of 178 mg/dl today. Reviewed s/sx of hypoglycemia and hyperglycemia along with recommended interventions. Advised to continue monitoring and recording readings. Advised to continue monitoring nutritional intake.     Patient Goals/Self-Care Activities Self administer medications as prescribed Attend all scheduled provider appointments Monitor blood glucose levels consistently and utilize recommended  interventions Monitor closely for s/sx of hyperglycemia and seek medical attention if needed Adhere to prescribed ADA/carb modified Notify provider or care management team with questions and new concerns as needed.   Follow Up Plan:  Will follow up within the next month    Patient Care Plan: Heart Failure (Adult)     Problem Identified: Symptom Exacerbation (Heart Failure)   Priority: High  Onset Date: 04/15/2020     Long-Range Goal: Symptom Exacerbation Prevented or Minimized   Start Date: 01/07/2021  Expected End Date: 04/07/2021  Recent Progress: On track  Priority: High  Note:    Current Barriers:  Chronic Disease Management support and educational needs r/t CHF.  Case Manager Clinical Goal(s):  Over the next 120 days, patient will not require hospitalization or emergent evaluation d/t complications r/t CHF exacerbation.  Interventions:  Collaboration with [name]., MD regarding development and update of comprehensive plan of care as evidenced by provider attestation and co-signature Inter-disciplinary care team collaboration (see longitudinal plan of care) Reviewed medications and current plan for CHF management. Remains compliant with treatment plan. Weights remain stable. Called to reports increased fatigue and cough. She denies chest discomfort, palpitations or shortness of breath. She completed a virtual visit earlier today. Symptoms were related to a viral respiratory illness. Albuterol inhaler and antibiotics were prescribed. Advised to obtain new medications and take as prescribed.  Reviewed s/sx of fluid overload and indications for notifying a provider. Reviewed s/sx of complications r/t CHF exacerbation and indications for seeking immediate medical attention.   Patient Goals/Self-Care Activities:  Take medications as prescribed Attend medical appointments as scheduled Follow plan for CHF symptom management and notify provider with concerns Monitor  and record weights Adhere to recommended cardiac diet Contact clinic with questions and new concerns as needed    Follow Up Plan:  Will follow up within the next month      PLAN A member of the care management team will follow up within the next month.   [name] [facility] Care Management [facility] [phone]

## 2021-01-08 ENCOUNTER — Ambulatory Visit: Payer: Self-pay

## 2021-01-08 DIAGNOSIS — I5032 Chronic diastolic (congestive) heart failure: Secondary | ICD-10-CM

## 2021-01-08 DIAGNOSIS — H9201 Otalgia, right ear: Secondary | ICD-10-CM

## 2021-01-08 NOTE — Chronic Care Management (AMB) (Signed)
Chronic Care Management   CCM RN Visit Note  01/08/2021 Name: Candace [name] MRN: [id number] DOB: 1950/06/16  Subjective: Candace [name] is a 70 y.o. year old female who is a primary care patient of [name]., MD. The care management team was consulted for assistance with disease management and care coordination needs.    Engaged with patient by telephone for follow up visit in response to provider referral for case management and care coordination services.   Consent to Services:  The patient was given information about Chronic Care Management services, agreed to services, and gave verbal consent prior to initiation of services.  Please see initial visit note for detailed documentation.    Assessment: Review of patient past medical history, allergies, medications, health status, including review of consultants reports, laboratory and other test data, was performed as part of comprehensive evaluation and provision of chronic care management services.   SDOH (Social Determinants of Health) assessments and interventions performed:  No  CCM Care Plan  Allergies  Allergen Reactions   Lisinopril Cough   Penicillins Swelling, Rash and Other (See Comments)    Did it involve swelling of the face/tongue/throat, SOB, or low BP? Yes Did it involve sudden or severe rash/hives, skin peeling, or any reaction on the inside of your mouth or nose? Yes Did you need to seek medical attention at a hospital or doctor's office? Yes When did it last happen?      15 years If all above answers are "NO", may proceed with cephalosporin use.     Outpatient Encounter Medications as of 01/08/2021  Medication Sig Note   acetaminophen (TYLENOL) 325 MG tablet Take 650 mg by mouth every 6 (six) hours as needed for moderate pain or headache.     albuterol (VENTOLIN HFA) 108 (90 Base) MCG/ACT inhaler Inhale 2 puffs into the lungs every 6 (six) hours as needed for wheezing or shortness of breath.     bismuth subsalicylate (PEPTO BISMOL) 262 MG chewable tablet Chew 524 mg by mouth as needed.    clopidogrel (PLAVIX) 75 MG tablet Take 1 tablet (75 mg total) by mouth daily.    doxycycline (VIBRA-TABS) 100 MG tablet Take 1 tablet (100 mg total) by mouth 2 (two) times daily.    ELIQUIS 5 MG TABS tablet Take 5 mg by mouth 2 (two) times daily. 06/06/2020: Prescribed by Dr. Nehemiah Massed   ezetimibe (ZETIA) 10 MG tablet Take 1 tablet (10 mg total) by mouth daily.    furosemide (LASIX) 20 MG tablet Take 20 mg by mouth.    glucose blood test strip Use to check blood sugars daily as instructed    insulin glargine (LANTUS SOLOSTAR) 100 UNIT/ML Solostar Pen Inject 10 Units into the skin at bedtime.    Insulin Pen Needle 32G X 4 MM MISC Use to inject insulin daily    isosorbide mononitrate (IMDUR) 120 MG 24 hr tablet Take 1 tablet (120 mg total) by mouth daily.    JARDIANCE 25 MG TABS tablet TAKE ONE TABLET BY MOUTH BEFORE BREAKFAST    Lancets (ONETOUCH DELICA PLUS [id number]) MISC USE UP TO 4 TIMES DAILY AS DIRECTED    loperamide (IMODIUM) 2 MG capsule Take 2 mg by mouth as needed.    losartan (COZAAR) 25 MG tablet TAKE ONE TABLET BY MOUTH EVERYDAY AT BEDTIME    metoprolol tartrate (LOPRESSOR) 100 MG tablet Take 100 mg by mouth 2 (two) times daily. 06/06/2020: Prescribed by Dr. Nehemiah Massed    nitroGLYCERIN Delilah Shan)  0.4 MG SL tablet Place 1 tablet (0.4 mg total) under the tongue every 5 (five) minutes as needed for chest pain.    nystatin (NYSTATIN) powder Apply 1 application topically daily. (Patient taking differently: Apply 1 application topically daily as needed.)    ondansetron (ZOFRAN-ODT) 8 MG disintegrating tablet Take 1 tablet (8 mg total) by mouth every 8 (eight) hours as needed.    pantoprazole (PROTONIX) 20 MG tablet Take 1 tablet (20 mg total) by mouth 2 (two) times daily.    rosuvastatin (CRESTOR) 40 MG tablet Take 1 tablet (40 mg total) by mouth daily.    sertraline (ZOLOFT) 100 MG tablet Take 1  tablet (100 mg total) by mouth daily.    TRULICITY 1.5 ZG/0.1VC SOPN Inject 1.5 mg into the skin once a week.    [DISCONTINUED] cetirizine (ZYRTEC) 5 MG tablet Take 2 tablets (10 mg total) by mouth daily.    No facility-administered encounter medications on file as of 01/08/2021.    Patient Active Problem List   Diagnosis Date Noted   Vertigo    Unsteady gait    Acute bronchitis 03/07/2019   Esophageal dysphagia    Stomach irritation    Gastric polyp    Esophageal lump    Hx of colonic polyp    Polyp of colon    Diverticulosis of large intestine without diverticulitis    PSVT (paroxysmal supraventricular tachycardia) ([place]) 10/03/2018   Abnormal ECG 07/05/2018   Tachycardia 03/27/2018   Pain in limb 02/28/2018   Chronic diastolic CHF (congestive heart failure) ([place]) 11/03/2017   TIA (transient ischemic attack) 11/03/2017   COPD suggested by initial evaluation ([place]) 08/23/2017   Acute and chronic respiratory failure with hypoxia ([place]) 08/23/2017   Postoperative state 08/15/2017   Incidental pulmonary nodule 08/01/2017   Non-ST elevation myocardial infarction (NSTEMI), subendocardial infarction, subsequent episode of care ([place]) 06/01/2017   B12 deficiency 12/01/2016   Vitamin D deficiency 11/04/2016   Depression with anxiety 09/30/2016   AF (paroxysmal atrial fibrillation) ([place]) 09/30/2016   Resting tremor 09/30/2016   Mild obstructive sleep apnea 09/30/2016   Headache 08/18/2016   Unstable angina ([place]) 08/03/2016   Type 2 diabetes mellitus with complication, without long-term current use of insulin ([place]) 08/03/2016   Chronic daily headache 05/05/2016   Asthma 11/11/2015   S/P CABG x 4 11/10/2015   Coronary artery disease 11/07/2015   Carotid artery narrowing 02/08/2014   Chest pain 02/08/2014   Mixed hyperlipidemia 02/08/2014   Bilateral carotid artery stenosis 02/08/2014   Atypical chest pain 02/07/2014   Essential hypertension 02/07/2014   Awareness of heartbeats  02/07/2014   D (diarrhea) 10/05/2013   Gastroesophageal reflux disease 10/05/2013    Conditions to be addressed/monitored:CHF and Right Ear Pain Patient Care Plan: Heart Failure (Adult)     Problem Identified: Symptom Exacerbation (Heart Failure)   Priority: High  Onset Date: 04/15/2020     Long-Range Goal: Symptom Exacerbation Prevented or Minimized   Start Date: 01/07/2021  Expected End Date: 04/07/2021  Recent Progress: On track  Priority: High  Note:    Current Barriers:  Chronic Disease Management support and educational needs r/t CHF.  Case Manager Clinical Goal(s):  Over the next 120 days, patient will not require hospitalization or emergent evaluation d/t complications r/t CHF exacerbation.  Interventions:  Collaboration with [name]., MD regarding development and update of comprehensive plan of care as evidenced by provider attestation and co-signature Inter-disciplinary care team collaboration (see longitudinal plan of care) Reviewed  medications and current plan for CHF management. Remains compliant with treatment plan. Weights remain stable. Called to reports increased fatigue and cough. She denies chest discomfort, palpitations or shortness of breath. She completed a virtual visit earlier today. Symptoms were related to a viral respiratory illness. Albuterol inhaler and antibiotics were prescribed. Advised to obtain new medications and take as prescribed.  Reviewed s/sx of fluid overload and indications for notifying a provider. Reviewed s/sx of complications r/t CHF exacerbation and indications for seeking immediate medical attention. Update on 01/08/13: Patient called to report that she has not been able to obtain new prescriptions. Antibiotic and an albuterol inhaler were recently prescribed for viral respiratory illness and right ear pain. Contacted [facility] at the [place] location. Pharmacy technician confirmed receipt of the order. Indicated  prescriptions have been delayed d/t a high volume of orders and decreased staffing. Indicated they they will try to have the prescriptions available for pick up later today. Ms. Nuccio updated of plan. She agreed to call if additional assistance is required.    Patient Goals/Self-Care Activities:  Take medications as prescribed Attend medical appointments as scheduled Follow plan for CHF symptom management and notify provider with concerns Monitor and record weights Adhere to recommended cardiac diet Contact clinic with questions and new concerns as needed    Follow Up Plan:  Will follow up within the next month     PLAN A member of the care management team will follow up within the next month.   [name] [facility] Care Management [facility] [phone]

## 2021-01-08 NOTE — Patient Instructions (Addendum)
Thank you for allowing the Chronic Care Management team to participate in your care.    Patient Care Plan: Heart Failure (Adult)     Problem Identified: Symptom Exacerbation (Heart Failure)   Priority: High  Onset Date: 04/15/2020     Long-Range Goal: Symptom Exacerbation Prevented or Minimized   Start Date: 01/07/2021  Expected End Date: 04/07/2021  Recent Progress: On track  Priority: High  Note:    Current Barriers:  Chronic Disease Management support and educational needs r/t CHF.  Case Manager Clinical Goal(s):  Over the next 120 days, patient will not require hospitalization or emergent evaluation d/t complications r/t CHF exacerbation.  Interventions:  Collaboration with [name]., MD regarding development and update of comprehensive plan of care as evidenced by provider attestation and co-signature Inter-disciplinary care team collaboration (see longitudinal plan of care) Reviewed medications and current plan for CHF management. Remains compliant with treatment plan. Weights remain stable. Called to reports increased fatigue and cough. She denies chest discomfort, palpitations or shortness of breath. She completed a virtual visit earlier today. Symptoms were related to a viral respiratory illness. Albuterol inhaler and antibiotics were prescribed. Advised to obtain new medications and take as prescribed.  Reviewed s/sx of fluid overload and indications for notifying a provider. Reviewed s/sx of complications r/t CHF exacerbation and indications for seeking immediate medical attention. Update on 01/08/13: Patient called to report that she has not been able to obtain new prescriptions. Antibiotic and an albuterol inhaler were recently prescribed for viral respiratory illness and right ear pain. [facility] the [place] location. Pharmacy technician confirmed receipt of the order. Indicated prescriptions have been delayed d/t a high volume of orders and  decreased staffing. Indicated they they will try to have the prescriptions available for pick up later today. Ms. Fryer updated of plan. She agreed to call if additional assistance is required.    Patient Goals/Self-Care Activities:  Take medications as prescribed Attend medical appointments as scheduled Follow plan for CHF symptom management and notify provider with concerns Monitor and record weights Adhere to recommended cardiac diet Contact clinic with questions and new concerns as needed    Follow Up Plan:  Will follow up within the next month      Ms. Sarchet verbalized understanding of the information discussed during the telephonic outreach. Declined need for mailed/printed instructions. A member of the care management team will follow up within the next month.   [name] [facility] Care Management [facility] [phone]

## 2021-01-09 ENCOUNTER — Ambulatory Visit: Payer: Self-pay

## 2021-01-09 DIAGNOSIS — I5032 Chronic diastolic (congestive) heart failure: Secondary | ICD-10-CM

## 2021-01-09 DIAGNOSIS — H9201 Otalgia, right ear: Secondary | ICD-10-CM

## 2021-01-09 NOTE — Chronic Care Management (AMB) (Signed)
Chronic Care Management   CCM RN Visit Note  01/09/2021 Name: Candace [name] MRN: [id number] DOB: July 08, 1950  Subjective: Candace [name] is a 70 y.o. year old female who is a primary care patient of Candace [name]., MD. The care management team was consulted for assistance with disease management and care coordination needs.    Call received from Candace [name] regarding medications. She was unable to obtain doxycycline due to [facility]. The pharmacy was contacted. The pharmacy technician indicated a significant delay in being able to fill prescriptions. Candace [name] will be in [place] on 01/09/21. Requesting to have the prescription transferred to the [facility] location.  Outpatient Encounter Medications as of 01/09/2021  Medication Sig Note   acetaminophen (TYLENOL) 325 MG tablet Take 650 mg by mouth every 6 (six) hours as needed for moderate pain or headache.     albuterol (VENTOLIN HFA) 108 (90 Base) MCG/ACT inhaler Inhale 2 puffs into the lungs every 6 (six) hours as needed for wheezing or shortness of breath.    bismuth subsalicylate (PEPTO BISMOL) 262 MG chewable tablet Chew 524 mg by mouth as needed.    clopidogrel (PLAVIX) 75 MG tablet Take 1 tablet (75 mg total) by mouth daily.    doxycycline (VIBRA-TABS) 100 MG tablet Take 1 tablet (100 mg total) by mouth 2 (two) times daily. 01/09/2021: [place] [facility] unable to [facility]. Requesting to have prescription filled at another location   ELIQUIS 5 MG TABS tablet Take 5 mg by mouth 2 (two) times daily. 06/06/2020: Prescribed by Dr. Nehemiah Massed   ezetimibe (ZETIA) 10 MG tablet Take 1 tablet (10 mg total) by mouth daily.    furosemide (LASIX) 20 MG tablet Take 20 mg by mouth.    glucose blood test strip Use to check blood sugars daily as instructed    insulin glargine (LANTUS SOLOSTAR) 100 UNIT/ML Solostar Pen Inject 10 Units into the skin at bedtime.    Insulin Pen Needle  32G X 4 MM MISC Use to inject insulin daily    isosorbide mononitrate (IMDUR) 120 MG 24 hr tablet Take 1 tablet (120 mg total) by mouth daily.    JARDIANCE 25 MG TABS tablet TAKE ONE TABLET BY MOUTH BEFORE BREAKFAST    Lancets (ONETOUCH DELICA PLUS [id number]) MISC USE UP TO 4 TIMES DAILY AS DIRECTED    loperamide (IMODIUM) 2 MG capsule Take 2 mg by mouth as needed.    losartan (COZAAR) 25 MG tablet TAKE ONE TABLET BY MOUTH EVERYDAY AT BEDTIME    metoprolol tartrate (LOPRESSOR) 100 MG tablet Take 100 mg by mouth 2 (two) times daily. 06/06/2020: Prescribed by Dr. Nehemiah Massed    nitroGLYCERIN (NITROSTAT) 0.4 MG SL tablet Place 1 tablet (0.4 mg total) under the tongue every 5 (five) minutes as needed for chest pain.    nystatin (NYSTATIN) powder Apply 1 application topically daily. (Patient taking differently: Apply 1 application topically daily as needed.)    ondansetron (ZOFRAN-ODT) 8 MG disintegrating tablet Take 1 tablet (8 mg total) by mouth every 8 (eight) hours as needed.    pantoprazole (PROTONIX) 20 MG tablet Take 1 tablet (20 mg total) by mouth 2 (two) times daily.    rosuvastatin (CRESTOR) 40 MG tablet Take 1 tablet (40 mg total) by mouth daily.    sertraline (ZOLOFT) 100 MG tablet Take 1 tablet (100 mg total) by mouth daily.    TRULICITY 1.5 HK/7.4QV SOPN Inject 1.5 mg into the skin once  a week.    [DISCONTINUED] cetirizine (ZYRTEC) 5 MG tablet Take 2 tablets (10 mg total) by mouth daily.    No facility-administered encounter medications on file as of 01/09/2021.    Patient called. Requested [facility] [place] was unable to [facility]. She will be returning to [place] today. Message forwarded to PCP regarding request. The care management team will follow up if additional assistance is required.    PLAN Message forwarded to PCP to transfer prescription for doxycycline.  A member of the care  management team will follow    [name] [facility] Care Management [facility] [phone]

## 2021-01-12 ENCOUNTER — Telehealth: Payer: Self-pay

## 2021-01-12 ENCOUNTER — Other Ambulatory Visit: Payer: Self-pay

## 2021-01-12 MED ORDER — DOXYCYCLINE HYCLATE 100 MG PO TABS: 100.0000 mg | ORAL_TABLET | Freq: Two times a day (BID) | ORAL | 1 refills | Status: DC

## 2021-01-12 NOTE — Progress Notes (Signed)
APPOINTMENT REMINDER  Candace [name] was reminded to have all medications, supplements and any blood glucose and blood pressure readings available for review with Junius Argyle, Pharm. D, at her telephone visit on 01/13/2021 at 8:30 am .  Patient confirm appointment    Patient states she needs a refill for her test strips, Lantus ,and Sertraline. Completed acute form for test strips, Lantus, and sertraline to be deliver on 01/15/2021.Form sent to clinical pharmacist for review.   Patient states [facility] D[facility]ine was ready to be pick up today on 01/12/2021.Patient states she pick it up on Friday 01/09/2021,she is asking if she suppose to take another round of Doxycycline. Per patient Chart Note on 01/09/2021 Neldon Labella RN, "Call received from Ms. [name] regarding medications. She was unable to obtain doxycycline due to the [facility] being [facility] to [facility]. The pharmacy was contacted. The pharmacy technician indicated a significant delay in being able to fill prescriptions. Ms. [name] will be in [place] on 01/09/21. Requesting to have the prescription transferred to the [facility] location".   Informed patient they may had sent another prescription today thinking she did not receive the first one, but she could double check with the clinical pharmacist at her follow up tomorrow.Patient verbalized understanding and states she is feeling better.  [place] Pharmacist Assistant [phone]

## 2021-01-13 ENCOUNTER — Ambulatory Visit: Payer: Medicare Other

## 2021-01-13 DIAGNOSIS — I5032 Chronic diastolic (congestive) heart failure: Secondary | ICD-10-CM

## 2021-01-13 DIAGNOSIS — E1142 Type 2 diabetes mellitus with diabetic polyneuropathy: Secondary | ICD-10-CM

## 2021-01-13 MED ORDER — LANTUS SOLOSTAR 100 UNIT/ML ~~LOC~~ SOPN
14.0000 [IU] | PEN_INJECTOR | Freq: Every day | SUBCUTANEOUS | 1 refills | Status: DC
Start: 2021-01-13 — End: 2021-03-24

## 2021-01-13 NOTE — Patient Instructions (Signed)
Visit Information It was great speaking with you today!  Please let me know if you have any questions about our visit.   Goals Addressed             This Visit's Progress    Monitor and Manage My Blood Sugar-Diabetes Type 2   On track    Timeframe:  Long-Range Goal Priority:  High Start Date: 04/15/20      Expected End Date: 02/26/22                   Follow Up within 30 days   - Check blood sugar daily before breakfast - Check blood sugar if I feel it is too high or too low - Enter blood sugar readings and medication or insulin into daily log - Take the blood sugar log to all doctor visits - Take the blood sugar meter to all doctor visits          Patient Care Plan: General Pharmacy (Adult)     Problem Identified: Hypertension, Hyperlipidemia, Diabetes, Atrial Fibrillation, Heart Failure, Coronary Artery Disease, GERD, Asthma, Depression and Anxiety   Priority: High     Long-Range Goal: Patient-Specific Goal   Start Date: 06/06/2020  Expected End Date: 11/25/2021  This Visit's Progress: On track  Recent Progress: On track  Priority: High  Note:   Current Barriers:  Unable to achieve control of diabetes  Unable to achieve control of cholesterol Does not adhere to prescribed medication regimen  Pharmacist Clinical Goal(s):  Over the next 90 days, patient will achieve control of diabetes as evidenced by A1c less than 8% patient will achieve control of cholesterol as evidenced by LDL less than 70 adhere to prescribed medication regimen as evidenced by utilization of enhanced pharmacy services through collaboration with PharmD and provider.   Interventions: 1:1 collaboration with [name]., MD regarding development and update of comprehensive plan of care as evidenced by provider attestation and co-signature Inter-disciplinary care team collaboration (see longitudinal plan of care) Comprehensive medication review performed; medication list updated in  electronic medical record  Diabetes (A1c goal <7%) -Uncontrolled -Diagnosed 2010 -Current medications: Trulicity 1.5 mg weekly on Tuesdays   Jardiance 25 mg daily  Lantus 12 units nightly  -Medications previously tried: Glipizide, Lantus, Tradjenta, Metformin, Metformin XR, Ozempic   -Current home glucose readings:    Fasting  18-Oct 149  17-Oct 174  16-Oct 152  15-Oct 187  14-Oct 155  13-Oct 130  12-Oct 238  Average 169    -Denies hypoglycemic/hyperglycemic symptoms -Current meal patterns: trying to eat more salads, water. Limiting carbohydrates when possible. 24 hour recall:  breakfast: Yogurt + Banana  lunch: Broccoli + Grapes (handful)  dinner: Mac&Cheese  + Broccoli + BBQ Ribs  snacks: None drinks: Glucerna shakes (1x daily), water, regular ginger ale  -Current exercise: walking daily (15 minute sessions)  -Patient has been recovering from a cold, finally feels her appetite is returning although she is not back at 100% yet.  -Increase Lantus to 14 units nightly. Refill sent in to patient pharmacy.   Hyperlipidemia: (LDL goal < 70) -History of CABG x4 2017, 2018  -Uncontrolled -Current treatment: Rosuvastatin 40 mg daily Ezetimibe 10 mg daily   -Current antiplatelet treatment: Clopidogrel 75 mg daily  -Medications previously tried: Atorvastatin (changed to rosuvastatin during hospitalization) -Cholesterol improved, but still uncontrolled. Could consider switching Zetia to Repatha for improved benefit. Will defer for now while adjusting diabetes regimen.  -Recommended to continue current medication  Atrial Fibrillation (Goal: prevent stroke and major bleeding) -Controlled -CHADSVASC: 6 -Current treatment: Rate control: Metoprolol tartrate 100 mg twice daily  Anticoagulation: Eliquis 5 mg twice daily  -Medications previously tried: NA -Home BP and HR readings: NA  -Counseled on importance of adherence to anticoagulant exactly as prescribed; avoidance of NSAIDs  due to increased bleeding risk with anticoagulants; Careful monitoring due to increased bleeding risk with clopidogrel + Eliquis -Recommended to continue current medication  Heart Failure (Goal: manage symptoms and prevent exacerbations) -Controlled -Last ejection fraction: 55-60% (Date: June 2021) -HF type: Diastolic -NYHA Class: III (marked limitation of activity) -AHA HF Stage: C (Heart disease and symptoms present) -Current treatment: Imdur 30 mg daily  Losartan 25 mg daily Metoprolol tartrate 100 mg twice daily  -Medications previously tried: NA  -Current home BP/HR readings: 117/80, 108/72  -Educated on Importance of weighing daily; if you gain more than 3 pounds in one day or 5 pounds in one week, contact provider's office -Recommended to continue current medication  Depression/Anxiety (Goal: maintain stable mood) -Controlled -Current treatment: Sertraline 100 mg daily  -Medications previously tried/failed: NA -PHQ9: 0 -GAD7: 1 - Patient reports she manages her stress by talking with daughter, reading, or medidating  -Continue current medications  GERD with dysphagia (Goal: prevent heartburn or reflux symptoms) -Controlled -Current treatment  Pantoprazole 40 mg daily   -Medications previously tried: Sucralfate,  -Continue current medications  Patient Goals/Self-Care Activities Over the next 90 days, patient will:  - check glucose daily, before breakfast, document, and provide at future appointments check blood pressure weekly, document, and provide at future appointments  Follow Up Plan: Telephone follow up appointment with care management team member scheduled for:  02/10/2021 at 8:30 AM    Patient agreed to services and verbal consent obtained.   The patient verbalized understanding of instructions, educational materials, and care plan provided today and declined offer to receive copy of patient instructions, educational materials, and care plan.   Junius Argyle, PharmD, [place], [place] [phone]

## 2021-01-13 NOTE — Progress Notes (Signed)
Chronic Care Management Pharmacy Note  01/13/2021 Name:  Candace [name] MRN:  [id number] DOB:  [birth year]  Summary:  Patient presents for CCM follow-up. Patient is still recovering from recent illness, but is feeling better today. Her blood sugars are improved but still slightly elevated.   Recommendations/Changes made from today's visit: INCREASE Lantus to 14 units nightly. Refill sent in to patient pharmacy  Plan: Health Concierge follow-up in two weeks to assess blood sugars CPP Follow-up in 4 weeks   Recommended Problem List Changes:   Modify:  Type 2 diabetes mellitus with diabetic polyneuropathy, with long-term current use of insulin  Subjective: Candace [name] is an 70 y.o. year old female who is a primary patient of [name]., MD.  The CCM team was consulted for assistance with disease management and care coordination needs.    Engaged with patient by telephone for follow up visit in response to provider referral for pharmacy case management and/or care coordination services.   Consent to Services:  The patient was given information about Chronic Care Management services, agreed to services, and gave verbal consent prior to initiation of services.  Please see initial visit note for detailed documentation.   Patient Care Team: [name]., MD as PCP - General (Family Medicine) Corey Skains, MD as Consulting Physician (Cardiology) Sharlotte Alamo, DPM (Podiatry) Neldon Labella, RN as Case Manager Virgel Manifold, MD as Consulting Physician (Gastroenterology) [place], [place], Glean Salvo, [facility] as Pharmacist (Pharmacist)  Recent office visits: 01/07/21: Patient presented to Dr. Rosanna Randy for ear pain and cough. Albuterol, doxycycline 100 mg twice daily.  12/02/20: Patient presented to Dr. Rosanna Randy for follow-up. Sertralien 100 mg.  08/11/20: Patient presented to Dr. Rosanna Randy for follow-up. A1c improved to 9.1%. Patient started  on losartan 25 mg daily.  05/13/20: Patient presented to Dr. Rosanna Randy for viral URI. Patient started on benzonatate.  05/01/20: Patient presented to Dr. Rosanna Randy for follow-up. A1c 12.2%. Patient started on Trulicity 2.13 mg weekly. Carafate  04/02/20: Patient presented to [facility], LPN for AWV.   Recent consult visits: 08/07/20: Patient presented to Dr. Nehemiah Massed (Cardiology) for follow-up. No medication changes noted.  07/07/20: Patient presented to Dr. Nehemiah Massed (Cardiology) for follow-up. No medication changes noted. 02/27/21: Patient presented to Hassell Done, NP. Patient started on Eliquis 5 mg twice daily for A-Fib. Non-adherence to atorvastatin   Hospital visits: 07/09/20: Patient hospitalized for chest pain. Aspirin stopped. Clopidogrel 7.43m daily, Ezetimibe 10 mg daily started. 3/22-3/23: Patient hospitalized for chest pain. Demand ischemia, possible pericarditis. Patient started on aspirin 81 mg daily, colchicine 0.6 mg twice daily   Objective:  Lab Results  Component Value Date   CREATININE 0.65 12/04/2020   BUN 9 12/04/2020   GFRNONAA >60 12/04/2020   GFRAA 108 02/06/2020   NA 139 12/04/2020   K 4.1 12/04/2020   CALCIUM 9.2 12/04/2020   CO2 25 12/04/2020    Lab Results  Component Value Date/Time   HGBA1C 10.7 (H) 12/02/2020 04:06 PM   HGBA1C 9.1 (H) 08/11/2020 09:32 AM   HGBA1C 9.2 07/10/2019 12:00 AM   HGBA1C 8.3 (H) 07/14/2013 05:42 AM   MICROALBUR 50 08/11/2020 08:51 AM    Last diabetic Eye exam:  Lab Results  Component Value Date/Time   HMDIABEYEEXA No Retinopathy 06/28/2019 12:00 AM    Last diabetic Foot exam: No results found for: HMDIABFOOTEX   Lab Results  Component Value Date   CHOL 164 08/11/2020   HDL 57 08/11/2020  LDLCALC 90 08/11/2020   TRIG 94 08/11/2020   CHOLHDL 2.9 08/11/2020    Hepatic Function Latest Ref Rng & Units 12/02/2020 10/20/2020 10/04/2020  Total Protein 6.0 - 8.5 g/dL 6.2 6.3 6.9  Albumin 3.8 - 4.8 g/dL 4.0 4.3 3.8  AST 0 - 40  IU/L _0 ALT 0 - 32 IU/L _1 Alk Phosphatase 44 - 121 IU/L 108 122(H) 107  Total Bilirubin 0.0 - 1.2 mg/dL 0.2 0.2 0.5  Bilirubin, Direct 0.0 - 0.2 mg/dL - - <0.1    Lab Results  Component Value Date/Time   TSH 1.180 01/03/2019 09:57 AM   TSH 1.262 09/14/2018 05:04 PM   TSH 1.661 12/23/2016 07:03 PM   TSH 0.818 10/01/2016 09:35 AM    CBC Latest Ref Rng & Units 12/04/2020 12/02/2020 10/20/2020  WBC 4.0 - 10.5 K/uL 6.1 6.8 7.0  Hemoglobin 12.0 - 15.0 g/dL 12.3 12.4 13.2  Hematocrit 36.0 - 46.0 % 36.9 37.1 38.6  Platelets 150 - 400 K/uL 185 189 191    Lab Results  Component Value Date/Time   VD25OH 12.2 (L) 10/01/2016 09:35 AM    Clinical ASCVD: Yes  The ASCVD Risk score (Arnett DK, et al., 2019) failed to calculate for the following reasons:   The patient has a prior MI or stroke diagnosis    Depression screen [facility] 2/9 10/29/2020 08/05/2020 04/15/2020  Decreased Interest 0 1 0  Down, Depressed, Hopeless 0 1 0  PHQ - 2 Score 0 2 0  Altered sleeping 1 1 -  Tired, decreased energy 1 1 -  Change in appetite 0 1 -  Feeling bad or failure about yourself  0 0 -  Trouble concentrating 0 0 -  Moving slowly or fidgety/restless 1 0 -  Suicidal thoughts 0 0 -  PHQ-9 Score 3 5 -  Difficult doing work/chores Not difficult at all Not difficult at all -  Some recent data might be hidden    Social History   Tobacco Use  Smoking Status Former   Packs/day: 0.50   Types: Cigarettes   Quit date: 10/07/2001   Years since quitting: 19.2  Smokeless Tobacco Never   BP Readings from Last 3 Encounters:  12/25/20 114/72  12/04/20 132/66  12/02/20 96/62   Pulse Readings from Last 3 Encounters:  12/25/20 88  12/04/20 75  12/02/20 90   Wt Readings from Last 3 Encounters:  12/25/20 164 lb (74.4 kg)  10/04/20 167 lb (75.8 kg)  09/24/20 167 lb (75.8 kg)    Assessment/Interventions: Review of patient past medical history, allergies, medications, health status, including review of  consultants reports, laboratory and other test data, was performed as part of comprehensive evaluation and provision of chronic care management services.   SDOH:  (Social Determinants of Health) assessments and interventions performed: Yes       CCM Care Plan  Allergies  Allergen Reactions   Lisinopril Cough   Penicillins Swelling, Rash and Other (See Comments)    Did it involve swelling of the face/tongue/throat, SOB, or low BP? Yes Did it involve sudden or severe rash/hives, skin peeling, or any reaction on the inside of your mouth or nose? Yes Did you need to seek medical attention at a hospital or doctor's office? Yes When did it last happen?      15 years If all above answers are "NO", may proceed with cephalosporin use.     Medications Reviewed Today     Reviewed by Minerva Ends,  Babs Sciara, RN (Registered Nurse) on 01/09/21 at [facility] List Status: <None>   Medication Order Taking? Sig Documenting Provider Last Dose Status Informant  acetaminophen (TYLENOL) 325 MG tablet [id number] No Take 650 mg by mouth every 6 (six) hours as needed for moderate pain or headache.  [provider] Taking Active Self  albuterol (VENTOLIN HFA) 108 (90 Base) MCG/ACT inhaler [id number]  Inhale 2 puffs into the lungs every 6 (six) hours as needed for wheezing or shortness of breath. [name]., MD  Active   bismuth subsalicylate (PEPTO BISMOL) 262 MG chewable tablet [id number] No Chew 524 mg by mouth as needed. [provider] Taking Active Self  Discontinued 03/06/19 1830   clopidogrel (PLAVIX) 75 MG tablet [id number] No Take 1 tablet (75 mg total) by mouth daily. [name]., MD Taking Active   doxycycline (VIBRA-TABS) 100 MG tablet [id number]  Take 1 tablet (100 mg total) by mouth 2 (two) times daily. [name]., MD  Active            Med Note [facility]   Fri Jan 09, 2021  8:24 AM) Saul Fordyce [facility] to [facility]. Requesting to  have prescription filled at another location  ELIQUIS 5 MG TABS tablet [id number] No Take 5 mg by mouth 2 (two) times daily. [provider] Taking Active Self           Med Note Michaelle Birks, Glean Salvo   Fri Jun 06, 2020 11:56 AM) Prescribed by Dr. Nehemiah Massed  ezetimibe (ZETIA) 10 MG tablet [id number] No Take 1 tablet (10 mg total) by mouth daily. [name]., MD Taking Active   furosemide (LASIX) 20 MG tablet [id number] No Take 20 mg by mouth. [provider] Taking Active Self  glucose blood test strip [id number] No Use to check blood sugars daily as instructed [name]., MD Taking Active   insulin glargine (LANTUS SOLOSTAR) 100 UNIT/ML Solostar Pen [id number] No Inject 10 Units into the skin at bedtime. [name]., MD Taking Active   Insulin Pen Needle 32G X 4 MM MISC [id number] No Use to inject insulin daily [name]., MD Taking Active   isosorbide mononitrate (IMDUR) 120 MG 24 hr tablet [id number] No Take 1 tablet (120 mg total) by mouth daily. [name]., MD Taking Active   JARDIANCE 25 MG TABS tablet [id number] No TAKE ONE TABLET BY MOUTH BEFORE BREAKFAST [name]., MD Taking Active   Lancets (ONETOUCH DELICA PLUS [id number]) [place] [id number] No USE UP TO 4 TIMES DAILY AS DIRECTED [name]., MD Taking Active Self  loperamide (IMODIUM) 2 MG capsule [id number] No Take 2 mg by mouth as needed. [provider] Taking Active Self  losartan (COZAAR) 25 MG tablet [id number] No TAKE ONE TABLET BY MOUTH EVERYDAY AT BEDTIME [name]., MD Taking Active   metoprolol tartrate (LOPRESSOR) 100 MG tablet [id number] No Take 100 mg by mouth 2 (two) times daily. [provider] Taking Active Self           Med Note Michaelle Birks, Glean Salvo   Fri Jun 06, 2020 11:56 AM) Prescribed by Dr. Nehemiah Massed   nitroGLYCERIN (NITROSTAT) 0.4 MG SL tablet [id number] No Place 1 tablet (0.4 mg total) under the tongue  every 5 (five) minutes as needed for chest pain. Bettey Costa, MD Taking Active Self           Med  Note Johny Drilling Mar 27, 2018  2:39 PM)    nystatin (NYSTATIN) powder [id number] No Apply 1 application topically daily.  Patient taking differently: Apply 1 application topically daily as needed.   Rubie Maid, MD Taking Active   ondansetron (ZOFRAN-ODT) 8 MG disintegrating tablet [id number] No Take 1 tablet (8 mg total) by mouth every 8 (eight) hours as needed. [name]., MD Taking Active   pantoprazole (PROTONIX) 20 MG tablet [id number] No Take 1 tablet (20 mg total) by mouth 2 (two) times daily. [name]., MD Taking Active Self  rosuvastatin (CRESTOR) 40 MG tablet [id number] No Take 1 tablet (40 mg total) by mouth daily. [name]., MD Taking Active   sertraline (ZOLOFT) 100 MG tablet [id number] No Take 1 tablet (100 mg total) by mouth daily. [name]., MD Taking Active   TRULICITY 1.5 OI/7.1IW [place] Aid [id number] No Inject 1.5 mg into the skin once a week. [name]., MD Taking Active             Patient Active Problem List   Diagnosis Date Noted   Vertigo    Unsteady gait    Acute bronchitis 03/07/2019   Esophageal dysphagia    Stomach irritation    Gastric polyp    Esophageal lump    Hx of colonic polyp    Polyp of colon    Diverticulosis of large intestine without diverticulitis    PSVT (paroxysmal supraventricular tachycardia) (HCC) 10/03/2018   Abnormal ECG 07/05/2018   Tachycardia 03/27/2018   Pain in limb 02/28/2018   Chronic diastolic CHF (congestive heart failure) ([place]) 11/03/2017   TIA (transient ischemic attack) 11/03/2017   COPD suggested by initial evaluation ([place]) 08/23/2017   Acute and chronic respiratory failure with hypoxia ([place]) 08/23/2017   Postoperative state 08/15/2017   Incidental pulmonary nodule 08/01/2017   Non-ST elevation myocardial infarction (NSTEMI), subendocardial infarction,  subsequent episode of care ([facility]) 06/01/2017   B12 deficiency 12/01/2016   Vitamin D deficiency 11/04/2016   Depression with anxiety 09/30/2016   AF (paroxysmal atrial fibrillation) ([place]) 09/30/2016   Resting tremor 09/30/2016   Mild obstructive sleep apnea 09/30/2016   Headache 08/18/2016   Unstable angina ([place]) 08/03/2016   Type 2 diabetes mellitus with complication, without long-term current use of insulin ([place]) 08/03/2016   Chronic daily headache 05/05/2016   Asthma 11/11/2015   S/P CABG x 4 11/10/2015   Coronary artery disease 11/07/2015   Carotid artery narrowing 02/08/2014   Chest pain 02/08/2014   Mixed hyperlipidemia 02/08/2014   Bilateral carotid artery stenosis 02/08/2014   Atypical chest pain 02/07/2014   Essential hypertension 02/07/2014   Awareness of heartbeats 02/07/2014   D (diarrhea) 10/05/2013   Gastroesophageal reflux disease 10/05/2013    Immunization History  Administered Date(s) Administered   Fluad Quad(high Dose 65+) 12/28/2018, 12/25/2020   Influenza, High Dose Seasonal PF 12/24/2016, 12/08/2017   Influenza,inj,Quad PF,6+ Mos 12/19/2015, 01/18/2020   Influenza-Unspecified 03/06/2015   Moderna Sars-Covid-2 Vaccination 04/14/2019, 05/12/2019, 01/28/2020   Pneumococcal Conjugate-13 03/16/2018   Pneumococcal Polysaccharide-23 12/24/2016   Tdap 07/28/2010   Zoster Recombinat (Shingrix) 06/14/2020    Conditions to be addressed/monitored:  Hypertension, Hyperlipidemia, Diabetes, Atrial Fibrillation, Heart Failure, Coronary Artery Disease, GERD, Asthma, Depression and Anxiety  There are no care plans that you recently modified to display for this patient.    Medication Assistance: None required.  Patient affirms current coverage meets needs.  Patient's preferred pharmacy is:  [facility] - [place], [place] - [address] [place] [address] Phone: [phone] Fax:  [phone]  [id number] [place] [id number]: [phone]: [phone] We discussed: Benefits of medication synchronization, packaging and delivery as well as enhanced pharmacist oversight with [facility]. Patient decided to: Utilize [facility] pharmacy for medication synchronization, packaging and delivery   Care Plan and Follow Up Patient Decision:  Patient agrees to Care Plan and Follow-up.  Plan: Telephone follow up appointment with care management team member scheduled for:  02/10/2021 at 8:30 AM  [phone]

## 2021-01-26 ENCOUNTER — Ambulatory Visit: Payer: Medicare Other

## 2021-01-26 DIAGNOSIS — E1165 Type 2 diabetes mellitus with hyperglycemia: Secondary | ICD-10-CM

## 2021-01-26 DIAGNOSIS — I5032 Chronic diastolic (congestive) heart failure: Secondary | ICD-10-CM

## 2021-01-26 DIAGNOSIS — E1142 Type 2 diabetes mellitus with diabetic polyneuropathy: Secondary | ICD-10-CM

## 2021-01-26 DIAGNOSIS — I480 Paroxysmal atrial fibrillation: Secondary | ICD-10-CM

## 2021-01-26 DIAGNOSIS — E118 Type 2 diabetes mellitus with unspecified complications: Secondary | ICD-10-CM | POA: Diagnosis not present

## 2021-01-26 NOTE — Chronic Care Management (AMB) (Signed)
Chronic Care Management   CCM RN Visit Note  01/26/2021 Name: Candace [name] MRN: [id number] DOB: [birth year]  Subjective: Candace [name] is a 70 y.o. year old female who is a primary care patient of [name]., MD. The care management team was consulted for assistance with disease management and care coordination needs.    Engaged with patient by telephone for follow up visit in response to provider referral for case management and care coordination services.   Consent to Services:  The patient was given information about Chronic Care Management services, agreed to services, and gave verbal consent prior to initiation of services.  Please see initial visit note for detailed documentation.   Assessment: Review of patient past medical history, allergies, medications, health status, including review of consultants reports, laboratory and other test data, was performed as part of comprehensive evaluation and provision of chronic care management services.   SDOH (Social Determinants of Health) assessments and interventions performed:  No  CCM Care Plan  Allergies  Allergen Reactions   Lisinopril Cough   Penicillins Swelling, Rash and Other (See Comments)    Did it involve swelling of the face/tongue/throat, SOB, or low BP? Yes Did it involve sudden or severe rash/hives, skin peeling, or any reaction on the inside of your mouth or nose? Yes Did you need to seek medical attention at a hospital or doctor's office? Yes When did it last happen?      15 years If all above answers are "NO", may proceed with cephalosporin use.     Outpatient Encounter Medications as of 01/26/2021  Medication Sig Note   acetaminophen (TYLENOL) 325 MG tablet Take 650 mg by mouth every 6 (six) hours as needed for moderate pain or headache.     albuterol (VENTOLIN HFA) 108 (90 Base) MCG/ACT inhaler Inhale 2 puffs into the lungs every 6 (six) hours as needed for wheezing or shortness of breath.     bismuth subsalicylate (PEPTO BISMOL) 262 MG chewable tablet Chew 524 mg by mouth as needed.    clopidogrel (PLAVIX) 75 MG tablet Take 1 tablet (75 mg total) by mouth daily.    doxycycline (VIBRA-TABS) 100 MG tablet Take 1 tablet (100 mg total) by mouth 2 (two) times daily.    ELIQUIS 5 MG TABS tablet Take 5 mg by mouth 2 (two) times daily. 06/06/2020: Prescribed by Dr. Nehemiah Massed   ezetimibe (ZETIA) 10 MG tablet Take 1 tablet (10 mg total) by mouth daily.    furosemide (LASIX) 20 MG tablet Take 20 mg by mouth.    glucose blood test strip Use to check blood sugars daily as instructed    insulin glargine (LANTUS SOLOSTAR) 100 UNIT/ML Solostar Pen Inject 14 Units into the skin at bedtime.    Insulin Pen Needle 32G X 4 MM MISC Use to inject insulin daily    isosorbide mononitrate (IMDUR) 120 MG 24 hr tablet Take 1 tablet (120 mg total) by mouth daily.    JARDIANCE 25 MG TABS tablet TAKE ONE TABLET BY MOUTH BEFORE BREAKFAST    Lancets (ONETOUCH DELICA PLUS [id number]) MISC USE UP TO 4 TIMES DAILY AS DIRECTED    loperamide (IMODIUM) 2 MG capsule Take 2 mg by mouth as needed.    losartan (COZAAR) 25 MG tablet TAKE ONE TABLET BY MOUTH EVERYDAY AT BEDTIME    metoprolol tartrate (LOPRESSOR) 100 MG tablet Take 100 mg by mouth 2 (two) times daily. 06/06/2020: Prescribed by Dr. Nehemiah Massed    nitroGLYCERIN (NITROSTAT) 0.4  MG SL tablet Place 1 tablet (0.4 mg total) under the tongue every 5 (five) minutes as needed for chest pain.    nystatin (NYSTATIN) powder Apply 1 application topically daily. (Patient taking differently: Apply 1 application topically daily as needed.)    ondansetron (ZOFRAN-ODT) 8 MG disintegrating tablet Take 1 tablet (8 mg total) by mouth every 8 (eight) hours as needed.    pantoprazole (PROTONIX) 20 MG tablet Take 1 tablet (20 mg total) by mouth 2 (two) times daily.    rosuvastatin (CRESTOR) 40 MG tablet Take 1 tablet (40 mg total) by mouth daily.    sertraline (ZOLOFT) 100 MG tablet Take 1  tablet (100 mg total) by mouth daily.    TRULICITY 1.5 LO/7.5IE SOPN Inject 1.5 mg into the skin once a week.    [DISCONTINUED] cetirizine (ZYRTEC) 5 MG tablet Take 2 tablets (10 mg total) by mouth daily.    No facility-administered encounter medications on file as of 01/26/2021.    Patient Active Problem List   Diagnosis Date Noted   Vertigo    Unsteady gait    Acute bronchitis 03/07/2019   Esophageal dysphagia    Stomach irritation    Gastric polyp    Esophageal lump    Hx of colonic polyp    Polyp of colon    Diverticulosis of large intestine without diverticulitis    PSVT (paroxysmal supraventricular tachycardia) ([place]) 10/03/2018   Abnormal ECG 07/05/2018   Tachycardia 03/27/2018   Pain in limb 02/28/2018   Chronic diastolic CHF (congestive heart failure) ([place]) 11/03/2017   TIA (transient ischemic attack) 11/03/2017   COPD suggested by initial evaluation ([place]) 08/23/2017   Acute and chronic respiratory failure with hypoxia ([place]) 08/23/2017   Postoperative state 08/15/2017   Incidental pulmonary nodule 08/01/2017   Non-ST elevation myocardial infarction (NSTEMI), subendocardial infarction, subsequent episode of care ([place]) 06/01/2017   B12 deficiency 12/01/2016   Vitamin D deficiency 11/04/2016   Depression with anxiety 09/30/2016   AF (paroxysmal atrial fibrillation) ([place]) 09/30/2016   Resting tremor 09/30/2016   Mild obstructive sleep apnea 09/30/2016   Headache 08/18/2016   Unstable angina ([place]) 08/03/2016   Type 2 diabetes mellitus with complication, without long-term current use of insulin ([place]) 08/03/2016   Chronic daily headache 05/05/2016   Asthma 11/11/2015   S/P CABG x 4 11/10/2015   Coronary artery disease 11/07/2015   Carotid artery narrowing 02/08/2014   Chest pain 02/08/2014   Mixed hyperlipidemia 02/08/2014   Bilateral carotid artery stenosis 02/08/2014   Atypical chest pain 02/07/2014   Essential hypertension 02/07/2014   Awareness of heartbeats  02/07/2014   D (diarrhea) 10/05/2013   Gastroesophageal reflux disease 10/05/2013    Conditions to be addressed/monitored:Atrial Fibrillation and DMII Patient Care Plan: Diabetes Type 2 (Adult)     Problem Identified: Glycemic Management (Diabetes, Type 2)   Priority: High  Onset Date: 04/15/2020     Long-Range Goal: Glycemic Management Optimized   Start Date: 11/21/2020  Expected End Date: 03/21/2021  Recent Progress: On track  Priority: High  Note:   Lab Results  Component Value Date   HGBA1C 10.7 (H) 12/02/2020    Current Barriers:  Chronic Disease Management support and educational needs r/t Diabetes self management.   Case Manager Clinical Goal(s):  Over the next 120 days, patient will demonstrate improved adherence to prescribed treatment plan for Diabetes self management as evidenced by daily monitoring and recording of CBG, adherence to ADA/ carb modified diet and adherence to prescribed medication regimen.  Interventions:  Collaboration with Miguel Aschoff, MD regarding development and update of comprehensive plan of care as evidenced by provider attestation and co-signature Inter-disciplinary care team collaboration (see longitudinal plan of care) Reviewed recent blood glucose readings and compliance with current treatment plan. Reviewed goal to maintain fasting readings below 150 mg/dl. Reports some improvement with fasting readings. Reports readings have ranged from the 140's to low 200's. Denies symptoms with elevated readings. Still attempting to adhere to a carb modified diet. Advised to monitor consistently and record readings to identify trends.     Patient Goals/Self-Care Activities Self administer medications as prescribed Attend all scheduled provider appointments Monitor blood glucose levels consistently and utilize recommended interventions Monitor closely for s/sx of hyperglycemia and seek medical attention if needed Adhere to prescribed ADA/carb  modified Notify provider or care management team with questions and new concerns as needed.     Patient Care Plan: Atrial Fibrillation (Adult)     Problem Identified: Dysrhythmia (Atrial Fibrillation)      Long-Range Goal: Heart Rate and Rhythm Monitored and Managed   Start Date: 12/05/2020  Expected End Date: 03/05/2021  Priority: High  Note:   Current Barriers:  Chronic Disease Management support and educational needs related to A-Fib.  Case Manager Clinical Goal(s):  Over the next 90 days, patient will not require hospitalization due to complications related to A-Fib.  Interventions:  Collaboration with [name]., MD regarding development and update of comprehensive plan of care as evidenced by provider attestation and co-signature Inter-disciplinary care team collaboration (see longitudinal plan of care) Reviewed medications and current treatment plan. Advised to continue taking medications as prescribed. Advised to notify the Cardiology team if unable to tolerate the prescribed regimen. Reports complying with treatment plan. She was scheduled to complete a Cardiology follow up on 12/26/20. The appointment has been rescheduled until mid October d/t possible inclement weather.  Dr. Rosanna Randy provided clearance for her dental procedure/tooth extraction and instructions for holding blood thinners during her visit today. She plans to schedule the procedure for October/November.  Thoroughly reviewed indications for seeking immediate medical attention. Updated on 01/26/21: Called to update the team regarding a nose bleed which she suspects was d/t dry air. Reports the bleed was not the result of a nose injury. Denies pain, headache, coughing or sneezing when the incident occurred. The bleeding resolved after holding pressure. Discussed precautions and increased risk for bleeding r/t blood thinners. Reviewed indications for seeking medical attention if she has another incident and the  bleeding can not be controlled.    Self-Care/Patient Goals: Take medications as prescribed Attend provider appointment as scheduled Assess symptoms daily Seek immediate medical attention for worsening/unrelieved symptoms Complete outreach with the Cardiology team as scheduled Contact the clinic with questions and new concerns if needed      PLAN A member of the care team will follow up within the next month.   [name] Care Management [facility] [phone]

## 2021-01-26 NOTE — Patient Instructions (Addendum)
Thank you for allowing the Chronic Care Management team to participate in your care.    Patient Care Plan: Diabetes Type 2 (Adult)     Problem Identified: Glycemic Management (Diabetes, Type 2)   Priority: High  Onset Date: 04/15/2020     Long-Range Goal: Glycemic Management Optimized   Start Date: 11/21/2020  Expected End Date: 03/21/2021  Recent Progress: On track  Priority: High  Note:   Lab Results  Component Value Date   HGBA1C 10.7 (H) 12/02/2020    Current Barriers:  Chronic Disease Management support and educational needs r/t Diabetes self management.   Case Manager Clinical Goal(s):  Over the next 120 days, patient will demonstrate improved adherence to prescribed treatment plan for Diabetes self management as evidenced by daily monitoring and recording of CBG, adherence to ADA/ carb modified diet and adherence to prescribed medication regimen.  Interventions:  Collaboration with Miguel Aschoff, MD regarding development and update of comprehensive plan of care as evidenced by provider attestation and co-signature Inter-disciplinary care team collaboration (see longitudinal plan of care) Reviewed recent blood glucose readings and compliance with current treatment plan. Reviewed goal to maintain fasting readings below 150 mg/dl. Reports some improvement with fasting readings. Reports readings have ranged from the 140's to low 200's. Denies symptoms with elevated readings. Still attempting to adhere to a carb modified diet. Advised to monitor consistently and record readings to identify trends.     Patient Goals/Self-Care Activities Self administer medications as prescribed Attend all scheduled provider appointments Monitor blood glucose levels consistently and utilize recommended interventions Monitor closely for s/sx of hyperglycemia and seek medical attention if needed Adhere to prescribed ADA/carb modified Notify provider or care management team with questions and  new concerns as needed.     Patient Care Plan: Atrial Fibrillation (Adult)     Problem Identified: Dysrhythmia (Atrial Fibrillation)      Long-Range Goal: Heart Rate and Rhythm Monitored and Managed   Start Date: 12/05/2020  Expected End Date: 03/05/2021  Priority: High  Note:   Current Barriers:  Chronic Disease Management support and educational needs related to A-Fib.  Case Manager Clinical Goal(s):  Over the next 90 days, patient will not require hospitalization due to complications related to A-Fib.  Interventions:  Collaboration with [name]., MD regarding development and update of comprehensive plan of care as evidenced by provider attestation and co-signature Inter-disciplinary care team collaboration (see longitudinal plan of care) Reviewed medications and current treatment plan. Advised to continue taking medications as prescribed. Advised to notify the Cardiology team if unable to tolerate the prescribed regimen. Reports complying with treatment plan. She was scheduled to complete a Cardiology follow up on 12/26/20. The appointment has been rescheduled until mid October d/t possible inclement weather.  Dr. Rosanna Randy provided clearance for her dental procedure/tooth extraction and instructions for holding blood thinners during her visit today. She plans to schedule the procedure for October/November.  Thoroughly reviewed indications for seeking immediate medical attention. Updated on 01/26/21: Called to update the team regarding a nose bleed which she suspects was d/t dry air. Reports the bleed was not the result of a nose injury. Denies pain, headache, coughing or sneezing when the incident occurred. The bleeding resolved after holding pressure. Discussed precautions and increased risk for bleeding r/t blood thinners. Reviewed indications for seeking medical attention if she has another incident and the bleeding can not be controlled.    Self-Care/Patient  Goals: Take medications as prescribed Attend provider appointment as scheduled Assess  symptoms daily Seek immediate medical attention for worsening/unrelieved symptoms Complete outreach with the Cardiology team as scheduled Contact the clinic with questions and new concerns if needed       Ms. Kosar verbalized understanding of the information discussed during the telephonic outreach. Declined need for mailed/printed instructions. A member of the care team will follow up within the next month.   [name] Care Management [facility] [phone]

## 2021-01-29 ENCOUNTER — Telehealth: Payer: Self-pay

## 2021-01-29 NOTE — Progress Notes (Addendum)
Chronic Care Management Pharmacy Assistant   Name: MELISSA TOMASELLI  MRN: [id number] DOB: September 27, 1950  Reason for Encounter: Diabetes Disease State Call.   Recent office visits:  01/26/2021 Felecia McCray (CCM) No medication changes noted  Recent consult visits:  No recent consult visit  Hospital visits:  None in previous 6 months  Medications: Outpatient Encounter Medications as of 01/29/2021  Medication Sig Note   acetaminophen (TYLENOL) 325 MG tablet Take 650 mg by mouth every 6 (six) hours as needed for moderate pain or headache.     albuterol (VENTOLIN HFA) 108 (90 Base) MCG/ACT inhaler Inhale 2 puffs into the lungs every 6 (six) hours as needed for wheezing or shortness of breath.    bismuth subsalicylate (PEPTO BISMOL) 262 MG chewable tablet Chew 524 mg by mouth as needed.    clopidogrel (PLAVIX) 75 MG tablet Take 1 tablet (75 mg total) by mouth daily.    doxycycline (VIBRA-TABS) 100 MG tablet Take 1 tablet (100 mg total) by mouth 2 (two) times daily.    ELIQUIS 5 MG TABS tablet Take 5 mg by mouth 2 (two) times daily. 06/06/2020: Prescribed by Dr. Nehemiah Massed   ezetimibe (ZETIA) 10 MG tablet Take 1 tablet (10 mg total) by mouth daily.    furosemide (LASIX) 20 MG tablet Take 20 mg by mouth.    glucose blood test strip Use to check blood sugars daily as instructed    insulin glargine (LANTUS SOLOSTAR) 100 UNIT/ML Solostar Pen Inject 14 Units into the skin at bedtime.    Insulin Pen Needle 32G X 4 MM MISC Use to inject insulin daily    isosorbide mononitrate (IMDUR) 120 MG 24 hr tablet Take 1 tablet (120 mg total) by mouth daily.    JARDIANCE 25 MG TABS tablet TAKE ONE TABLET BY MOUTH BEFORE BREAKFAST    Lancets (ONETOUCH DELICA PLUS [id number]) MISC USE UP TO 4 TIMES DAILY AS DIRECTED    loperamide (IMODIUM) 2 MG capsule Take 2 mg by mouth as needed.    losartan (COZAAR) 25 MG tablet TAKE ONE TABLET BY MOUTH EVERYDAY AT BEDTIME    metoprolol tartrate (LOPRESSOR) 100 MG tablet Take  100 mg by mouth 2 (two) times daily. 06/06/2020: Prescribed by Dr. Nehemiah Massed    nitroGLYCERIN (NITROSTAT) 0.4 MG SL tablet Place 1 tablet (0.4 mg total) under the tongue every 5 (five) minutes as needed for chest pain.    nystatin (NYSTATIN) powder Apply 1 application topically daily. (Patient taking differently: Apply 1 application topically daily as needed.)    ondansetron (ZOFRAN-ODT) 8 MG disintegrating tablet Take 1 tablet (8 mg total) by mouth every 8 (eight) hours as needed.    pantoprazole (PROTONIX) 20 MG tablet Take 1 tablet (20 mg total) by mouth 2 (two) times daily.    rosuvastatin (CRESTOR) 40 MG tablet Take 1 tablet (40 mg total) by mouth daily.    sertraline (ZOLOFT) 100 MG tablet Take 1 tablet (100 mg total) by mouth daily.    TRULICITY 1.5 EZ/6.6QH SOPN Inject 1.5 mg into the skin once a week.    [DISCONTINUED] cetirizine (ZYRTEC) 5 MG tablet Take 2 tablets (10 mg total) by mouth daily.    No facility-administered encounter medications on file as of 01/29/2021.    Care Gaps: COVID 19 Vaccine (4- Booster) Ophthalmology Exam (Last Completed 06/28/2019) Tetanus Vaccine (Last Completed 07/28/2010) Shingrix Vaccine (2 of 2) (Last Completed 06/14/2020) Foot Exam (Last Completed 10/27/2019) A1C was 10.7% on 12/02/20. [facility]: 11/18/2020 Rosuvastatin  40 mg last filled for a 90- Day supply at [facility] 11/18/2020 Losartan 25 mg last filled for a 30-Day supply at [facility] 11/18/2020 Jardiance 25 mg last filled for a 90-Day Supply at [facility] 58/11/9831 Trulicity 1.5 mg last filled for a 30-Day supply at [facility] Medication Fill Gaps: None ID  Recent Relevant Labs: Lab Results  Component Value Date/Time   HGBA1C 10.7 (H) 12/02/2020 04:06 PM   HGBA1C 9.1 (H) 08/11/2020 09:32 AM   HGBA1C 9.2 07/10/2019 12:00 AM   HGBA1C 8.3 (H) 07/14/2013 05:42 AM   MICROALBUR 50 08/11/2020 08:51 AM    Kidney Function Lab Results  Component Value  Date/Time   CREATININE 0.65 12/04/2020 11:18 AM   CREATININE 0.93 12/02/2020 04:06 PM   CREATININE 1.39 (H) 03/24/2017 04:38 PM   CREATININE 0.78 04/29/2014 08:42 PM   CREATININE 0.79 02/03/2014 04:06 PM   GFRNONAA >60 12/04/2020 11:18 AM   GFRNONAA >60 04/29/2014 08:42 PM   GFRNONAA >60 09/16/2013 05:23 AM   GFRAA 108 02/06/2020 09:49 AM   GFRAA >60 04/29/2014 08:42 PM   GFRAA >60 09/16/2013 05:23 AM    Current antihyperglycemic regimen:  Trulicity 1.5 mg weekly on Tuesdays   Jardiance 25 mg daily  Lantus 14 units nightly  What recent interventions/DTPs have been made to improve glycemic control:  Patient reports her appetite has decrease a lot since starting Trulicity.Patient states she may have two meals a day with 5 pieces of chips.Notified Clinical Pharmacist. Patient reports her weight Fluctuates from 166-160.Patient states when she does not take her Furosemide her weight will be around 166, but once she takes her Furosemide her weight will go down to 160.- Have there been any recent hospitalizations or ED visits since last visit with CPP? No Patient reports hypoglycemic symptoms, including Shaky and Nervous/irritable Patient reports she had a episode last week where she felt shaky, and started to cough which lead her to vomit with blood coming out of her nose. Patient denies hyperglycemic symptoms, including blurry vision, excessive thirst, fatigue, polyuria, and weakness How often are you checking your blood sugar? once daily What are your blood sugars ranging?  Fasting:  On 01/29/2021 it was 123. On 01/28/2021 it was 147. On 01/27/2021 it was 184. On 01/26/2021 it was 174. On 01/25/2021 it was 180. On 01/24/2021 it was 174. On 01/23/2021 it was 146. On 01/22/2021 it was 161. Patient states she believes her blood sugar is higher then usual because she was sick taking medications as in Mucinex and Robitussin with honey.  During the week, how often does your blood glucose  drop below 70? Never  Are you checking your feet daily/regularly?   Patient reports checking her feet daily, but denies any numbness,pain or tingling sensations in the bottom of her feet.  Adherence Review: Is the patient currently on a STATIN medication? Yes Is the patient currently on ACE/ARB medication? Yes Does the patient have >5 day gap between last estimated fill dates? No  Telephone follow up appointment with care management team member scheduled for:  02/10/2021 at 8:30 AM  [place] Pharmacist Assistant [phone]   Addendum 01/29/21:   Blood sugars are continuing to improve with her current regimen. Trulicity will decrease a person's appetite, but it appears to be working well to help improve her blood sugars. It is ok to eat smaller meals or a handful of chips or crackers if not hungry. She should still try and eat plenty of vegetables and not  eat too many carbohydrates at one time.  I would recommend she increase her Lantus to 16 units nightly until follow-up with me.  Junius Argyle, PharmD, [place], [place] [phone]

## 2021-01-30 ENCOUNTER — Ambulatory Visit (INDEPENDENT_AMBULATORY_CARE_PROVIDER_SITE_OTHER): Payer: Medicare Other

## 2021-01-30 DIAGNOSIS — E118 Type 2 diabetes mellitus with unspecified complications: Secondary | ICD-10-CM

## 2021-01-30 DIAGNOSIS — I480 Paroxysmal atrial fibrillation: Secondary | ICD-10-CM

## 2021-01-30 NOTE — Patient Instructions (Addendum)
Thank you for allowing the Chronic Care Management team to participate in your care.    Patient Care Plan: Diabetes Type 2 (Adult)     Problem Identified: Glycemic Management (Diabetes, Type 2)   Priority: High  Onset Date: 04/15/2020     Long-Range Goal: Glycemic Management Optimized   Start Date: 11/21/2020  Expected End Date: 03/21/2021  Recent Progress: On track  Priority: High  Note:   Lab Results  Component Value Date   HGBA1C 10.7 (H) 12/02/2020    Current Barriers:  Chronic Disease Management support and educational needs r/t Diabetes self management.   Case Manager Clinical Goal(s):  Over the next 120 days, patient will demonstrate improved adherence to prescribed treatment plan for Diabetes self management as evidenced by daily monitoring and recording of CBG, adherence to ADA/ carb modified diet and adherence to prescribed medication regimen.  Interventions:  Collaboration with Miguel Aschoff, MD regarding development and update of comprehensive plan of care as evidenced by provider attestation and co-signature Inter-disciplinary care team collaboration (see longitudinal plan of care) Discussed compliance with current treatment plan. Reports taking oral medications and insulin as prescribed. Reports blood glucose readings continue to improve with all readings below 200. Reports fasting reading was 165 mg/dl today. Attempting to comply with recommended low carb/diabetic diet. Reviewed s/sx of hypoglycemia and hyperglycemia along with appropriate interventions. Discussed concerns related to skin changes. Reports developing a itching rash to the inner thigh. Describes area as reddened with no skin tears. Reports area is not painful or warm to touch. Indicates this has occurred in the past and was effectively managed with nystatin powder and clobetasol cream. Medications reviewed. She is aware of available refills for nystatin. Requesting a new order for clobetasol cream.  Request forwarded to PCP.  Reviewed sx of skin infection and indications for seeking immediate medical attention.    Patient Goals/Self-Care Activities Self administer medications as prescribed Attend all scheduled provider appointments Monitor blood glucose levels consistently and utilize recommended interventions Monitor closely for s/sx of hyperglycemia and seek medical attention if needed Adhere to prescribed ADA/carb modified Contact clinic if skin rash worsens Notify provider or care management team with questions and new concerns as needed.      Patient Care Plan: Atrial Fibrillation (Adult)     Problem Identified: Dysrhythmia (Atrial Fibrillation)      Long-Range Goal: Heart Rate and Rhythm Monitored and Managed   Start Date: 12/05/2020  Expected End Date: 03/05/2021  Priority: High  Note:   Current Barriers:  Chronic Disease Management support and educational needs related to A-Fib.  Case Manager Clinical Goal(s):  Over the next 90 days, patient will not require hospitalization due to complications related to A-Fib.  Interventions:  Collaboration with [name]., MD regarding development and update of comprehensive plan of care as evidenced by provider attestation and co-signature Inter-disciplinary care team collaboration (see longitudinal plan of care) Reviewed medications and current treatment plan. Advised to continue taking medications as prescribed. Advised to notify the Cardiology team if unable to tolerate the prescribed regimen.  Dr. Rosanna Randy provided clearance for her dental procedure/tooth extraction and instructions for holding blood thinners during her visit today. She plans to schedule the procedure for October/November.  Thoroughly reviewed indications for seeking immediate medical attention. Updated on 01/30/21: Continues to take medications/blood thinners as prescribed. She has not experienced nose bleeds since the last outreach. Reviewed  indications for seeking medical attention if she has another incident and the bleeding can not be  controlled.    Self-Care/Patient Goals: Take medications as prescribed Attend provider appointment as scheduled Assess symptoms daily Seek immediate medical attention for worsening/unrelieved symptoms Complete outreach with the Cardiology team as scheduled Contact the clinic with questions and new concerns if needed        Ms. Railey verbalized understanding of the information discussed during the telephonic outreach. Declined need for mailed/printed instructions. A member of the care management team will follow up within the next month.   [name] [facility] Care Management [facility] [phone]

## 2021-01-30 NOTE — Progress Notes (Signed)
Left Voice message to inform patient per Clinical Pharmacist,Blood sugars are continuing to improve with her current regimen. Trulicity will decrease a person's appetite, but it appears to be working well to help improve her blood sugars. It is ok to eat smaller meals or a handful of chips or crackers if not hungry. She should still try and eat plenty of vegetables and not eat too many carbohydrates at one time. I would recommend she increase her Lantus to 16 units nightly until follow-up with me.  [place] Pharmacist Assistant [phone]

## 2021-01-30 NOTE — Chronic Care Management (AMB) (Signed)
Chronic Care Management   CCM RN Visit Note  01/30/2021 Name: Candace [name] MRN: [id number] DOB: October 18, 1950  Subjective: Candace [name] is a 70 y.o. year old female who is a primary care patient of [name]., MD. The care management team was consulted for assistance with disease management and care coordination needs.    Engaged with patient by telephone for follow up visit in response to provider referral for case management and care coordination services.   Consent to Services:  The patient was given information about Chronic Care Management services, agreed to services, and gave verbal consent prior to initiation of services.  Please see initial visit note for detailed documentation.  Assessment: Review of patient past medical history, allergies, medications, health status, including review of consultants reports, laboratory and other test data, was performed as part of comprehensive evaluation and provision of chronic care management services.   SDOH (Social Determinants of Health) assessments and interventions performed: No   CCM Care Plan  Allergies  Allergen Reactions   Lisinopril Cough   Penicillins Swelling, Rash and Other (See Comments)    Did it involve swelling of the face/tongue/throat, SOB, or low BP? Yes Did it involve sudden or severe rash/hives, skin peeling, or any reaction on the inside of your mouth or nose? Yes Did you need to seek medical attention at a hospital or doctor's office? Yes When did it last happen?      15 years If all above answers are "NO", may proceed with cephalosporin use.     Outpatient Encounter Medications as of 01/30/2021  Medication Sig Note   acetaminophen (TYLENOL) 325 MG tablet Take 650 mg by mouth every 6 (six) hours as needed for moderate pain or headache.     albuterol (VENTOLIN HFA) 108 (90 Base) MCG/ACT inhaler Inhale 2 puffs into the lungs every 6 (six) hours as needed for wheezing or shortness of breath.     bismuth subsalicylate (PEPTO BISMOL) 262 MG chewable tablet Chew 524 mg by mouth as needed.    clopidogrel (PLAVIX) 75 MG tablet Take 1 tablet (75 mg total) by mouth daily.    doxycycline (VIBRA-TABS) 100 MG tablet Take 1 tablet (100 mg total) by mouth 2 (two) times daily.    ELIQUIS 5 MG TABS tablet Take 5 mg by mouth 2 (two) times daily. 06/06/2020: Prescribed by Dr. Nehemiah Massed   ezetimibe (ZETIA) 10 MG tablet Take 1 tablet (10 mg total) by mouth daily.    furosemide (LASIX) 20 MG tablet Take 20 mg by mouth.    glucose blood test strip Use to check blood sugars daily as instructed    insulin glargine (LANTUS SOLOSTAR) 100 UNIT/ML Solostar Pen Inject 14 Units into the skin at bedtime.    Insulin Pen Needle 32G X 4 MM MISC Use to inject insulin daily    isosorbide mononitrate (IMDUR) 120 MG 24 hr tablet Take 1 tablet (120 mg total) by mouth daily.    JARDIANCE 25 MG TABS tablet TAKE ONE TABLET BY MOUTH BEFORE BREAKFAST    Lancets (ONETOUCH DELICA PLUS [id number]) MISC USE UP TO 4 TIMES DAILY AS DIRECTED    loperamide (IMODIUM) 2 MG capsule Take 2 mg by mouth as needed.    losartan (COZAAR) 25 MG tablet TAKE ONE TABLET BY MOUTH EVERYDAY AT BEDTIME    metoprolol tartrate (LOPRESSOR) 100 MG tablet Take 100 mg by mouth 2 (two) times daily. 06/06/2020: Prescribed by Dr. Nehemiah Massed    nitroGLYCERIN (NITROSTAT) 0.4 MG  SL tablet Place 1 tablet (0.4 mg total) under the tongue every 5 (five) minutes as needed for chest pain.    nystatin (NYSTATIN) powder Apply 1 application topically daily. (Patient taking differently: Apply 1 application topically daily as needed.)    ondansetron (ZOFRAN-ODT) 8 MG disintegrating tablet Take 1 tablet (8 mg total) by mouth every 8 (eight) hours as needed.    pantoprazole (PROTONIX) 20 MG tablet Take 1 tablet (20 mg total) by mouth 2 (two) times daily.    rosuvastatin (CRESTOR) 40 MG tablet Take 1 tablet (40 mg total) by mouth daily.    sertraline (ZOLOFT) 100 MG tablet Take 1  tablet (100 mg total) by mouth daily.    TRULICITY 1.5 KG/2.5KY SOPN Inject 1.5 mg into the skin once a week.    [DISCONTINUED] cetirizine (ZYRTEC) 5 MG tablet Take 2 tablets (10 mg total) by mouth daily.    No facility-administered encounter medications on file as of 01/30/2021.    Patient Active Problem List   Diagnosis Date Noted   Vertigo    Unsteady gait    Acute bronchitis 03/07/2019   Esophageal dysphagia    Stomach irritation    Gastric polyp    Esophageal lump    Hx of colonic polyp    Polyp of colon    Diverticulosis of large intestine without diverticulitis    PSVT (paroxysmal supraventricular tachycardia) ([place]) 10/03/2018   Abnormal ECG 07/05/2018   Tachycardia 03/27/2018   Pain in limb 02/28/2018   Chronic diastolic CHF (congestive heart failure) ([place]) 11/03/2017   TIA (transient ischemic attack) 11/03/2017   COPD suggested by initial evaluation ([place]) 08/23/2017   Acute and chronic respiratory failure with hypoxia ([place]) 08/23/2017   Postoperative state 08/15/2017   Incidental pulmonary nodule 08/01/2017   Non-ST elevation myocardial infarction (NSTEMI), subendocardial infarction, subsequent episode of care ([place]) 06/01/2017   B12 deficiency 12/01/2016   Vitamin D deficiency 11/04/2016   Depression with anxiety 09/30/2016   AF (paroxysmal atrial fibrillation) ([place]) 09/30/2016   Resting tremor 09/30/2016   Mild obstructive sleep apnea 09/30/2016   Headache 08/18/2016   Unstable angina ([place]) 08/03/2016   Type 2 diabetes mellitus with complication, without long-term current use of insulin ([place]) 08/03/2016   Chronic daily headache 05/05/2016   Asthma 11/11/2015   S/P CABG x 4 11/10/2015   Coronary artery disease 11/07/2015   Carotid artery narrowing 02/08/2014   Chest pain 02/08/2014   Mixed hyperlipidemia 02/08/2014   Bilateral carotid artery stenosis 02/08/2014   Atypical chest pain 02/07/2014   Essential hypertension 02/07/2014   Awareness of heartbeats  02/07/2014   D (diarrhea) 10/05/2013   Gastroesophageal reflux disease 10/05/2013    Conditions to be addressed/monitored:A-Fib, DMII, and Skin Rash Patient Care Plan: Diabetes Type 2 (Adult)     Problem Identified: Glycemic Management (Diabetes, Type 2)   Priority: High  Onset Date: 04/15/2020     Long-Range Goal: Glycemic Management Optimized   Start Date: 11/21/2020  Expected End Date: 03/21/2021  Recent Progress: On track  Priority: High  Note:   Lab Results  Component Value Date   HGBA1C 10.7 (H) 12/02/2020    Current Barriers:  Chronic Disease Management support and educational needs r/t Diabetes self management.   Case Manager Clinical Goal(s):  Over the next 120 days, patient will demonstrate improved adherence to prescribed treatment plan for Diabetes self management as evidenced by daily monitoring and recording of CBG, adherence to ADA/ carb modified diet and adherence to prescribed medication regimen.  Interventions:  Collaboration with Miguel Aschoff, MD regarding development and update of comprehensive plan of care as evidenced by provider attestation and co-signature Inter-disciplinary care team collaboration (see longitudinal plan of care) Discussed compliance with current treatment plan. Reports taking oral medications and insulin as prescribed. Reports blood glucose readings continue to improve with all readings below 200. Reports fasting reading was 165 mg/dl today. Attempting to comply with recommended low carb/diabetic diet. Reviewed s/sx of hypoglycemia and hyperglycemia along with appropriate interventions. Discussed concerns related to skin changes. Reports developing a itching rash to the inner thigh. Describes area as reddened with no skin tears. Reports area is not painful or warm to touch. Indicates this has occurred in the past and was effectively managed with nystatin powder and clobetasol cream. Medications reviewed. She is aware of available refills  for nystatin. Requesting a new order for clobetasol cream. Request forwarded to PCP.  Reviewed sx of skin infection and indications for seeking immediate medical attention.    Patient Goals/Self-Care Activities Self administer medications as prescribed Attend all scheduled provider appointments Monitor blood glucose levels consistently and utilize recommended interventions Monitor closely for s/sx of hyperglycemia and seek medical attention if needed Adhere to prescribed ADA/carb modified Contact clinic if skin rash worsens Notify provider or care management team with questions and new concerns as needed.      Patient Care Plan: Atrial Fibrillation (Adult)     Problem Identified: Dysrhythmia (Atrial Fibrillation)      Long-Range Goal: Heart Rate and Rhythm Monitored and Managed   Start Date: 12/05/2020  Expected End Date: 03/05/2021  Priority: High  Note:   Current Barriers:  Chronic Disease Management support and educational needs related to A-Fib.  Case Manager Clinical Goal(s):  Over the next 90 days, patient will not require hospitalization due to complications related to A-Fib.  Interventions:  Collaboration with [name]., MD regarding development and update of comprehensive plan of care as evidenced by provider attestation and co-signature Inter-disciplinary care team collaboration (see longitudinal plan of care) Reviewed medications and current treatment plan. Advised to continue taking medications as prescribed. Advised to notify the Cardiology team if unable to tolerate the prescribed regimen.  Dr. Rosanna Randy provided clearance for her dental procedure/tooth extraction and instructions for holding blood thinners during her visit today. She plans to schedule the procedure for October/November.  Thoroughly reviewed indications for seeking immediate medical attention. Updated on 01/30/21: Continues to take medications/blood thinners as prescribed. She has not  experienced nose bleeds since the last outreach. Reviewed indications for seeking medical attention if she has another incident and the bleeding can not be controlled.    Self-Care/Patient Goals: Take medications as prescribed Attend provider appointment as scheduled Assess symptoms daily Seek immediate medical attention for worsening/unrelieved symptoms Complete outreach with the Cardiology team as scheduled Contact the clinic with questions and new concerns if needed        PLAN A member of the care management team will follow up within the next month.   [name] [facility] Care Management [facility] [phone]

## 2021-01-31 ENCOUNTER — Other Ambulatory Visit: Payer: Self-pay | Admitting: Family Medicine

## 2021-01-31 DIAGNOSIS — E1165 Type 2 diabetes mellitus with hyperglycemia: Secondary | ICD-10-CM

## 2021-01-31 DIAGNOSIS — E1159 Type 2 diabetes mellitus with other circulatory complications: Secondary | ICD-10-CM

## 2021-01-31 NOTE — Telephone Encounter (Signed)
Requested Prescriptions  Pending Prescriptions Disp Refills  . isosorbide mononitrate (IMDUR) 120 MG 24 hr tablet [Pharmacy Med Name: ISOSORBIDE MONONIT ER 120 MG] 90 tablet 0    Sig: TAKE 1 TABLET BY MOUTH EVERY DAY     Cardiovascular:  Nitrates Passed - 01/31/2021  9:44 AM      Passed - Last BP in normal range    BP Readings from Last 1 Encounters:  12/25/20 114/72         Passed - Last Heart Rate in normal range    Pulse Readings from Last 1 Encounters:  12/25/20 88         Passed - Valid encounter within last 12 months    Recent Outpatient Visits          3 weeks ago Right ear pain   [facility] [name]., MD   1 month ago Uncontrolled type 2 diabetes mellitus with hyperglycemia [facility])   [facility] [name]., MD   2 months ago Chest pain, unspecified type   [facility] [name]., MD   5 months ago Uncontrolled type 2 diabetes mellitus with hyperglycemia [facility])   [facility] [name]., MD   8 months ago Viral URI   [facility] [name]., MD      Future Appointments            In 2 months [name]., MD [facility], [place]

## 2021-01-31 NOTE — Telephone Encounter (Signed)
Requested Prescriptions  Pending Prescriptions Disp Refills  . TRULICITY 1.5 DH/6.8SH SOPN [Pharmacy Med Name: Trulicity 1.5 UO/3.7 mL subcutaneous pen injector] 2 mL 2    Sig: Inject 1.5mg  into [place]     Endocrinology:  Diabetes - GLP-1 Receptor Agonists Failed - 01/31/2021  8:03 AM      Failed - HBA1C is between 0 and 7.9 and within 180 days    Hemoglobin A1C  Date Value Ref Range Status  07/10/2019 9.2  Final   Hgb A1c MFr Bld  Date Value Ref Range Status  12/02/2020 10.7 (H) 4.8 - 5.6 % Final    Comment:             Prediabetes: 5.7 - 6.4          Diabetes: >6.4          Glycemic control for adults with diabetes: <7.0          Passed - Valid encounter within last 6 months    Recent Outpatient Visits          3 weeks ago Right ear pain   [place] [name]., MD   1 month ago Uncontrolled type 2 diabetes mellitus with hyperglycemia [facility])   [facility] [name]., MD   2 months ago Chest pain, unspecified type   [facility] [name]., MD   5 months ago Uncontrolled type 2 diabetes mellitus with hyperglycemia [facility])   [facility] [name]., MD   8 months ago Viral URI   [facility] [name]., MD      Future Appointments            In 2 months [name]., MD [facility], [place]

## 2021-02-05 ENCOUNTER — Ambulatory Visit: Payer: Self-pay

## 2021-02-05 DIAGNOSIS — I5032 Chronic diastolic (congestive) heart failure: Secondary | ICD-10-CM

## 2021-02-05 DIAGNOSIS — E118 Type 2 diabetes mellitus with unspecified complications: Secondary | ICD-10-CM

## 2021-02-05 NOTE — Patient Instructions (Signed)
Thank you for allowing the Chronic Care Management team to participate in your care.  

## 2021-02-05 NOTE — Chronic Care Management (AMB) (Signed)
Chronic Care Management   CCM RN Visit Note  02/05/2021 Name: Candace [name] MRN: [id number] DOB: 1951-02-01  Subjective: Candace [name] is a 69 y.o. year old female who is a primary care patient of [name]., MD. The care management team was consulted for assistance with disease management and care coordination needs.    Engaged with patient by telephone for follow up visit in response to provider referral for case management and care coordination services.   Consent to Services:  The patient was given information about Chronic Care Management services, agreed to services, and gave verbal consent prior to initiation of services.  Please see initial visit note for detailed documentation.    Assessment: Review of patient past medical history, allergies, medications, health status, including review of consultants reports, laboratory and other test data, was performed as part of comprehensive evaluation and provision of chronic care management services.   SDOH (Social Determinants of Health) assessments and interventions performed: No   CCM Care Plan  Allergies  Allergen Reactions   Lisinopril Cough   Penicillins Swelling, Rash and Other (See Comments)    Did it involve swelling of the face/tongue/throat, SOB, or low BP? Yes Did it involve sudden or severe rash/hives, skin peeling, or any reaction on the inside of your mouth or nose? Yes Did you need to seek medical attention at a hospital or doctor's office? Yes When did it last happen?      15 years If all above answers are "NO", may proceed with cephalosporin use.     Outpatient Encounter Medications as of 02/05/2021  Medication Sig Note   acetaminophen (TYLENOL) 325 MG tablet Take 650 mg by mouth every 6 (six) hours as needed for moderate pain or headache.     albuterol (VENTOLIN HFA) 108 (90 Base) MCG/ACT inhaler Inhale 2 puffs into the lungs every 6 (six) hours as needed for wheezing or shortness of breath.     bismuth subsalicylate (PEPTO BISMOL) 262 MG chewable tablet Chew 524 mg by mouth as needed.    clopidogrel (PLAVIX) 75 MG tablet Take 1 tablet (75 mg total) by mouth daily.    doxycycline (VIBRA-TABS) 100 MG tablet Take 1 tablet (100 mg total) by mouth 2 (two) times daily.    ELIQUIS 5 MG TABS tablet Take 5 mg by mouth 2 (two) times daily. 06/06/2020: Prescribed by Dr. Nehemiah Massed   ezetimibe (ZETIA) 10 MG tablet Take 1 tablet (10 mg total) by mouth daily.    furosemide (LASIX) 20 MG tablet Take 20 mg by mouth.    glucose blood test strip Use to check blood sugars daily as instructed    insulin glargine (LANTUS SOLOSTAR) 100 UNIT/ML Solostar Pen Inject 14 Units into the skin at bedtime.    Insulin Pen Needle 32G X 4 MM MISC Use to inject insulin daily    isosorbide mononitrate (IMDUR) 120 MG 24 hr tablet TAKE 1 TABLET BY MOUTH EVERY DAY    JARDIANCE 25 MG TABS tablet TAKE ONE TABLET BY MOUTH BEFORE BREAKFAST    Lancets (ONETOUCH DELICA PLUS [id number]) MISC USE UP TO 4 TIMES DAILY AS DIRECTED    loperamide (IMODIUM) 2 MG capsule Take 2 mg by mouth as needed.    losartan (COZAAR) 25 MG tablet TAKE ONE TABLET BY MOUTH EVERYDAY AT BEDTIME    metoprolol tartrate (LOPRESSOR) 100 MG tablet Take 100 mg by mouth 2 (two) times daily. 06/06/2020: Prescribed by Dr. Nehemiah Massed    nitroGLYCERIN (NITROSTAT) 0.4 MG  SL tablet Place 1 tablet (0.4 mg total) under the tongue every 5 (five) minutes as needed for chest pain.    nystatin (NYSTATIN) powder Apply 1 application topically daily. (Patient taking differently: Apply 1 application topically daily as needed.)    ondansetron (ZOFRAN-ODT) 8 MG disintegrating tablet Take 1 tablet (8 mg total) by mouth every 8 (eight) hours as needed.    pantoprazole (PROTONIX) 20 MG tablet Take 1 tablet (20 mg total) by mouth 2 (two) times daily.    rosuvastatin (CRESTOR) 40 MG tablet Take 1 tablet (40 mg total) by mouth daily.    sertraline (ZOLOFT) 100 MG tablet Take 1 tablet (100 mg  total) by mouth daily.    TRULICITY 1.5 KK/9.3GH SOPN Inject 1.5mg  into THE SKIN ONCE A WEEK    [DISCONTINUED] cetirizine (ZYRTEC) 5 MG tablet Take 2 tablets (10 mg total) by mouth daily.    No facility-administered encounter medications on file as of 02/05/2021.    Patient Active Problem List   Diagnosis Date Noted   Vertigo    Unsteady gait    Acute bronchitis 03/07/2019   Esophageal dysphagia    Stomach irritation    Gastric polyp    Esophageal lump    Hx of colonic polyp    Polyp of colon    Diverticulosis of large intestine without diverticulitis    PSVT (paroxysmal supraventricular tachycardia) ([place]) 10/03/2018   Abnormal ECG 07/05/2018   Tachycardia 03/27/2018   Pain in limb 02/28/2018   Chronic diastolic CHF (congestive heart failure) ([place]) 11/03/2017   TIA (transient ischemic attack) 11/03/2017   COPD suggested by initial evaluation ([place]) 08/23/2017   Acute and chronic respiratory failure with hypoxia ([place]) 08/23/2017   Postoperative state 08/15/2017   Incidental pulmonary nodule 08/01/2017   Non-ST elevation myocardial infarction (NSTEMI), subendocardial infarction, subsequent episode of care ([place]) 06/01/2017   B12 deficiency 12/01/2016   Vitamin D deficiency 11/04/2016   Depression with anxiety 09/30/2016   AF (paroxysmal atrial fibrillation) ([place]) 09/30/2016   Resting tremor 09/30/2016   Mild obstructive sleep apnea 09/30/2016   Headache 08/18/2016   Unstable angina ([place]) 08/03/2016   Type 2 diabetes mellitus with complication, without long-term current use of insulin ([place]) 08/03/2016   Chronic daily headache 05/05/2016   Asthma 11/11/2015   S/P CABG x 4 11/10/2015   Coronary artery disease 11/07/2015   Carotid artery narrowing 02/08/2014   Chest pain 02/08/2014   Mixed hyperlipidemia 02/08/2014   Bilateral carotid artery stenosis 02/08/2014   Atypical chest pain 02/07/2014   Essential hypertension 02/07/2014   Awareness of heartbeats 02/07/2014   D  (diarrhea) 10/05/2013   Gastroesophageal reflux disease 10/05/2013    Conditions to be addressed/monitored:CHF and DMII  Patient Care Plan: Diabetes Type 2 (Adult)     Problem Identified: Glycemic Management (Diabetes, Type 2)   Priority: High  Onset Date: 04/15/2020     Long-Range Goal: Glycemic Management Optimized   Start Date: 11/21/2020  Expected End Date: 03/21/2021  Recent Progress: On track  Priority: High  Note:   Lab Results  Component Value Date   HGBA1C 10.7 (H) 12/02/2020    Current Barriers:  Chronic Disease Management support and educational needs r/t Diabetes self management.   Case Manager Clinical Goal(s):  Over the next 120 days, patient will demonstrate improved adherence to prescribed treatment plan for Diabetes self management as evidenced by daily monitoring and recording of CBG, adherence to ADA/ carb modified diet and adherence to prescribed medication regimen.  Interventions:  Collaboration with Miguel Aschoff, MD regarding development and update of comprehensive plan of care as evidenced by provider attestation and co-signature Inter-disciplinary care team collaboration (see longitudinal plan of care) Discussed compliance with current treatment plan. Reports taking oral medications and insulin as prescribed. Reports blood glucose readings continue to improve with all readings below 200. Reports fasting reading was 165 mg/dl today. Attempting to comply with recommended low carb/diabetic diet. Reviewed s/sx of hypoglycemia and hyperglycemia along with appropriate interventions. Discussed concerns related to skin changes. Reports developing a itching rash to the inner thigh. Describes area as reddened with no skin tears. Reports area is not painful or warm to touch. Indicates this has occurred in the past and was effectively managed with nystatin powder and clobetasol cream. Medications reviewed. She is aware of available refills for nystatin. Requesting a  new order for clobetasol cream. Request forwarded to PCP.  Reviewed sx of skin infection and indications for seeking immediate medical attention.    Patient Goals/Self-Care Activities Self administer medications as prescribed Attend all scheduled provider appointments Monitor blood glucose levels consistently and utilize recommended interventions Monitor closely for s/sx of hyperglycemia and seek medical attention if needed Adhere to prescribed ADA/carb modified Contact clinic if skin rash worsens Notify provider or care management team with questions and new concerns as needed.     Patient Care Plan: Heart Failure (Adult)     Problem Identified: Symptom Exacerbation (Heart Failure)   Priority: High  Onset Date: 04/15/2020     Long-Range Goal: Symptom Exacerbation Prevented or Minimized   Start Date: 01/07/2021  Expected End Date: 04/07/2021  Recent Progress: On track  Priority: High  Note:    Current Barriers:  Chronic Disease Management support and educational needs r/t CHF.  Case Manager Clinical Goal(s):  Over the next 120 days, patient will not require hospitalization or emergent evaluation d/t complications r/t CHF exacerbation.  Interventions:  Collaboration with [name]., MD regarding development and update of comprehensive plan of care as evidenced by provider attestation and co-signature Inter-disciplinary care team collaboration (see longitudinal plan of care) Reviewed medications and current plan for CHF management. Remains compliant with treatment plan. Reports weights have been stable however she has experienced decreased activity tolerance. Reports experiencing weakness and fatigue. Also reports experiencing "heart fluttering" while staying with her granddaughter earlier this week. Discussed options for acute provide evaluation. Denies need for urgent care visit. Reports that she will contact the Cardiology team if needed. Agreed to report to the  nearest Emergency Department if her symptoms worsen over the weekend. Denies episodes of chest pain or shortness of breath. Reviewed s/sx of fluid overload and indications for notifying a provider. Thoroughly reviewed s/sx of complications r/t CHF exacerbation and indications for seeking immediate medical attention.   Patient Goals/Self-Care Activities:  Take medications as prescribed Attend medical appointments as scheduled Follow plan for CHF symptom management and notify provider with concerns Monitor and record weights Adhere to recommended cardiac diet Contact clinic with questions and new concerns as needed       PLAN A member of the care management team will follow up within the next two weeks.   [name] Care [facility] [phone]

## 2021-02-06 ENCOUNTER — Other Ambulatory Visit: Payer: Self-pay | Admitting: Family Medicine

## 2021-02-06 DIAGNOSIS — E118 Type 2 diabetes mellitus with unspecified complications: Secondary | ICD-10-CM

## 2021-02-06 DIAGNOSIS — F4320 Adjustment disorder, unspecified: Secondary | ICD-10-CM

## 2021-02-08 ENCOUNTER — Encounter (HOSPITAL_COMMUNITY): Payer: Self-pay | Admitting: Emergency Medicine

## 2021-02-08 ENCOUNTER — Emergency Department (HOSPITAL_COMMUNITY): Payer: Medicare Other

## 2021-02-08 ENCOUNTER — Other Ambulatory Visit: Payer: Self-pay

## 2021-02-08 ENCOUNTER — Observation Stay (HOSPITAL_COMMUNITY)
Admission: EM | Admit: 2021-02-08 | Discharge: 2021-02-09 | Disposition: A | Discharge status: Home / Self Care | Payer: Medicare Other | Attending: Emergency Medicine | Admitting: Emergency Medicine

## 2021-02-08 ENCOUNTER — Observation Stay (HOSPITAL_COMMUNITY): Payer: Medicare Other

## 2021-02-08 DIAGNOSIS — E119 Type 2 diabetes mellitus without complications: Secondary | ICD-10-CM | POA: Diagnosis not present

## 2021-02-08 DIAGNOSIS — Z955 Presence of coronary angioplasty implant and graft: Secondary | ICD-10-CM | POA: Diagnosis not present

## 2021-02-08 DIAGNOSIS — R110 Nausea: Secondary | ICD-10-CM | POA: Diagnosis not present

## 2021-02-08 DIAGNOSIS — Z794 Long term (current) use of insulin: Secondary | ICD-10-CM | POA: Insufficient documentation

## 2021-02-08 DIAGNOSIS — E118 Type 2 diabetes mellitus with unspecified complications: Secondary | ICD-10-CM | POA: Diagnosis present

## 2021-02-08 DIAGNOSIS — I2510 Atherosclerotic heart disease of native coronary artery without angina pectoris: Secondary | ICD-10-CM | POA: Diagnosis not present

## 2021-02-08 DIAGNOSIS — K760 Fatty (change of) liver, not elsewhere classified: Secondary | ICD-10-CM | POA: Diagnosis not present

## 2021-02-08 DIAGNOSIS — R0789 Other chest pain: Secondary | ICD-10-CM | POA: Diagnosis not present

## 2021-02-08 DIAGNOSIS — R519 Headache, unspecified: Secondary | ICD-10-CM | POA: Diagnosis present

## 2021-02-08 DIAGNOSIS — E1142 Type 2 diabetes mellitus with diabetic polyneuropathy: Secondary | ICD-10-CM | POA: Diagnosis present

## 2021-02-08 DIAGNOSIS — Z8673 Personal history of transient ischemic attack (TIA), and cerebral infarction without residual deficits: Secondary | ICD-10-CM | POA: Insufficient documentation

## 2021-02-08 DIAGNOSIS — I5032 Chronic diastolic (congestive) heart failure: Secondary | ICD-10-CM | POA: Diagnosis present

## 2021-02-08 DIAGNOSIS — Z87891 Personal history of nicotine dependence: Secondary | ICD-10-CM | POA: Insufficient documentation

## 2021-02-08 DIAGNOSIS — R079 Chest pain, unspecified: Secondary | ICD-10-CM | POA: Diagnosis not present

## 2021-02-08 DIAGNOSIS — Z79899 Other long term (current) drug therapy: Secondary | ICD-10-CM | POA: Insufficient documentation

## 2021-02-08 DIAGNOSIS — Z20822 Contact with and (suspected) exposure to covid-19: Secondary | ICD-10-CM | POA: Diagnosis not present

## 2021-02-08 DIAGNOSIS — Z7901 Long term (current) use of anticoagulants: Secondary | ICD-10-CM | POA: Diagnosis not present

## 2021-02-08 DIAGNOSIS — I10 Essential (primary) hypertension: Secondary | ICD-10-CM | POA: Insufficient documentation

## 2021-02-08 DIAGNOSIS — R109 Unspecified abdominal pain: Secondary | ICD-10-CM | POA: Diagnosis not present

## 2021-02-08 DIAGNOSIS — I110 Hypertensive heart disease with heart failure: Secondary | ICD-10-CM | POA: Insufficient documentation

## 2021-02-08 DIAGNOSIS — I480 Paroxysmal atrial fibrillation: Secondary | ICD-10-CM | POA: Diagnosis present

## 2021-02-08 DIAGNOSIS — Z743 Need for continuous supervision: Secondary | ICD-10-CM | POA: Diagnosis not present

## 2021-02-08 DIAGNOSIS — Z7902 Long term (current) use of antithrombotics/antiplatelets: Secondary | ICD-10-CM | POA: Diagnosis not present

## 2021-02-08 DIAGNOSIS — J449 Chronic obstructive pulmonary disease, unspecified: Secondary | ICD-10-CM | POA: Diagnosis not present

## 2021-02-08 DIAGNOSIS — E538 Deficiency of other specified B group vitamins: Secondary | ICD-10-CM | POA: Diagnosis present

## 2021-02-08 LAB — HEPATIC FUNCTION PANEL
ALT: 13 U/L (ref 0–44)
AST: 18 U/L (ref 15–41)
Albumin: 3.6 g/dL (ref 3.5–5.0)
Alkaline Phosphatase: 80 U/L (ref 38–126)
Bilirubin, Direct: 0.1 mg/dL (ref 0.0–0.2)
Indirect Bilirubin: 0.4 mg/dL (ref 0.3–0.9)
Total Bilirubin: 0.5 mg/dL (ref 0.3–1.2)
Total Protein: 6.2 g/dL — ABNORMAL LOW (ref 6.5–8.1)

## 2021-02-08 LAB — BASIC METABOLIC PANEL
Anion gap: 6 (ref 5–15)
BUN: 7 mg/dL — ABNORMAL LOW (ref 8–23)
CO2: 25 mmol/L (ref 22–32)
Calcium: 8.7 mg/dL — ABNORMAL LOW (ref 8.9–10.3)
Chloride: 109 mmol/L (ref 98–111)
Creatinine, Ser: 0.68 mg/dL (ref 0.44–1.00)
GFR, Estimated: >60 mL/min (ref >60)
Glucose, Bld: 120 mg/dL — ABNORMAL HIGH (ref 70–99)
Potassium: 4.2 mmol/L (ref 3.5–5.1)
Sodium: 140 mmol/L (ref 135–145)

## 2021-02-08 LAB — CBC
HCT: 40.8 % (ref 36.0–46.0)
Hemoglobin: 13 g/dL (ref 12.0–15.0)
MCH: 32.2 pg (ref 26.0–34.0)
MCHC: 31.9 g/dL (ref 30.0–36.0)
MCV: 101 fL — ABNORMAL HIGH (ref 80.0–100.0)
Platelets: 191 10*3/uL (ref 150–400)
RBC: 4.04 MIL/uL (ref 3.87–5.11)
RDW: 12.6 % (ref 11.5–15.5)
WBC: 6.7 10*3/uL (ref 4.0–10.5)
nRBC: 0 % (ref 0.0–0.2)

## 2021-02-08 LAB — RESP PANEL BY RT-PCR (FLU A&B, COVID) ARPGX2
Influenza A by PCR: NEGATIVE
Influenza B by PCR: NEGATIVE
SARS Coronavirus 2 by RT PCR: NEGATIVE

## 2021-02-08 LAB — LIPASE, BLOOD: Lipase: 33 U/L (ref 11–51)

## 2021-02-08 LAB — CBG MONITORING, ED: Glucose-Capillary: 108 mg/dL — ABNORMAL HIGH (ref 70–99)

## 2021-02-08 LAB — TROPONIN I (HIGH SENSITIVITY)
Troponin I (High Sensitivity): 6 ng/L (ref <18)
Troponin I (High Sensitivity): 6 ng/L (ref <18)

## 2021-02-08 LAB — BRAIN NATRIURETIC PEPTIDE: B Natriuretic Peptide: 197.6 pg/mL — ABNORMAL HIGH (ref 0.0–100.0)

## 2021-02-08 MED ORDER — HEPARIN (PORCINE) 25000 UT/250ML-% IV SOLN
900.0000 [IU]/h | INTRAVENOUS | Status: DC
  Administered 2021-02-08: 900 [IU]/h via INTRAVENOUS
  Filled 2021-02-08: qty 250

## 2021-02-08 MED ORDER — ACETAMINOPHEN 325 MG PO TABS
650.0000 mg | ORAL_TABLET | Freq: Once | ORAL | Status: AC
  Administered 2021-02-08: 650 mg via ORAL
  Filled 2021-02-08: qty 2

## 2021-02-08 MED ORDER — INSULIN ASPART 100 UNIT/ML IJ SOLN: 0.0000 [IU] | Freq: Three times a day (TID) | INTRAMUSCULAR | Status: DC

## 2021-02-08 NOTE — H&P (Signed)
History and Physical    Candace [name] QPY:[id number] DOB: [birth year] DOA: 02/08/2021  PCP: [name]., MD  Patient coming from: Home.  Chief Complaint: Chest pain.  HPI: Candace [name] is a 70 y.o. female with history of CAD status post CABG in 2017 last cardiac cath in June 2021 with history of diastolic dysfunction last EF measured in March 2022 was 60 to 65% with grade 1 diastolic dysfunction, diabetes mellitus, paroxysmal atrial fibrillation presents to the ER with complaint of chest pain.  Patient states he has been having chest pain off and on for the last 4 days.  Chest pain is mostly in the lower part of the sternum sometimes radiating to her left shoulder.  Happens even at rest sometimes on exertion lasts around 10 to 15 minutes resolved without any intervention.  The pain has been recurring more often patient is asked to come to the ER.  Patient also has been having some associated nausea denies any vomiting abdominal pain.  Denies any fever chills or productive cough.  ED Course: In the ER patient chest x-ray was unremarkable EKG shows sinus rhythm with LBBB and cardiac markers were negative.  ER physician discussed with on-call cardiologist will be seeing patient in consult.  Patient admitted for further work-up.  At the time of my exam patient was chest pain-free.  COVID test is pending.  Review of Systems: As per HPI, rest all negative.   Past Medical History:  Diagnosis Date   Allergy    Anemia    Anxiety    Arrhythmia    Arthritis    Atrial fibrillation ([place])    Coronary artery disease    Depression    Diabetes mellitus without complication ([place])    Dyspnea    doe   Dysrhythmia    GERD (gastroesophageal reflux disease)    Headache    History of hiatal hernia    Hyperlipidemia    Hypertension    Myocardial infarction ([place])    2016, 04/2017   Myocardial infarction with cardiac rehabilitation [facility])    MI 2016/ CABG 8/17    FINISHED CARDIAC REHAB 3  WEEKS AGO   Panic attack    Pneumonia    Reflux    Stroke ([place]) 2015   showed up on MRI; no weakness noted   TIA (transient ischemic attack)    Voice tremor     Past Surgical History:  Procedure Laterality Date   ABDOMINAL HYSTERECTOMY     APPENDECTOMY  1975   ARTERY BIOPSY Right 04/26/2016   Procedure: BIOPSY TEMPORAL ARTERY;  Surgeon: Margaretha Sheffield, MD;  Location: [facility] ORS;  Service: ENT;  Laterality: Right;   CARDIAC CATHETERIZATION N/A 11/06/2015   Procedure: Left Heart Cath and Coronary Angiography;  Surgeon: Corey Skains, MD;  Location: [place] CV LAB;  Service: Cardiovascular;  Laterality: N/A;   CESAREAN SECTION     COLONOSCOPY  2015   COLONOSCOPY WITH PROPOFOL N/A 11/03/2018   Procedure: COLONOSCOPY WITH PROPOFOL;  Surgeon: Virgel Manifold, MD;  Location: [facility] ENDOSCOPY;  Service: Endoscopy;  Laterality: N/A;   CORONARY ANGIOPLASTY  04/2017   [place]   CORONARY ARTERY BYPASS GRAFT N/A 11/10/2015   Procedure: CORONARY ARTERY BYPASS GRAFTING (CABG), ON PUMP, TIMES FOUR, USING LEFT INTERNAL MAMMARY ARTERY, BILATERAL GREATER SAPHENOUS VEINS HARVESTED ENDOSCOPICALLY;  Surgeon: [name], MD;  Location: [place];  Service: Open Heart Surgery;  Laterality: N/A;  LIMA-LAD; SEQ SVG-OM1-OM2; SVG-PL  CORONARY STENT INTERVENTION N/A 08/05/2016   Procedure: Coronary Stent Intervention;  Surgeon: Isaias Cowman, MD;  Location: [place] CV LAB;  Service: Cardiovascular;  Laterality: N/A;   ESOPHAGOGASTRODUODENOSCOPY (EGD) WITH PROPOFOL N/A 11/03/2018   Procedure: ESOPHAGOGASTRODUODENOSCOPY (EGD) WITH PROPOFOL;  Surgeon: Virgel Manifold, MD;  Location: [facility] ENDOSCOPY;  Service: Endoscopy;  Laterality: N/A;   HYSTERECTOMY ABDOMINAL WITH SALPINGO-OOPHORECTOMY Bilateral 08/15/2017   Procedure: HYSTERECTOMY ABDOMINAL WITH BILATERAL SALPINGO-OOPHORECTOMY;  Surgeon: Rubie Maid, MD;  Location: [facility] OR[facility];  Service: Gynecology;  Laterality: Bilateral;    LEFT HEART CATH AND CORONARY ANGIOGRAPHY N/A 08/05/2016   Procedure: Left Heart Cath and Coronary Angiography;  Surgeon: Isaias Cowman, MD;  Location: [place] [facility];  Service: Cardiovascular;  Laterality: N/A;   LEFT HEART CATH AND CORS/GRAFTS ANGIOGRAPHY N/A 09/19/2018   Procedure: LEFT HEART CATH AND CORS/GRAFTS ANGIOGRAPHY;  Surgeon: Corey Skains, MD;  Location: [place] [facility];  Service: Cardiovascular;  Laterality: N/A;   LEFT HEART CATH AND CORS/GRAFTS ANGIOGRAPHY N/A 08/30/2019   Procedure: LEFT HEART CATH AND CORS/GRAFTS ANGIOGRAPHY;  Surgeon: Teodoro Spray, MD;  Location: [place] CV LAB;  Service: Cardiovascular;  Laterality: N/A;   OOPHORECTOMY     TEE WITHOUT CARDIOVERSION N/A 11/10/2015   Procedure: TRANSESOPHAGEAL ECHOCARDIOGRAM (TEE);  Surgeon: [name], MD;  Location: [place];  Service: Open Heart Surgery;  Laterality: N/A;   TUBAL LIGATION       reports that she quit smoking about 19 years ago. Her smoking use included cigarettes. She smoked an average of .5 packs per day. She has never used smokeless tobacco. She reports that she does not drink alcohol and does not use drugs.  Allergies  Allergen Reactions   Lisinopril Cough   Penicillins Swelling, Rash and Other (See Comments)    Did it involve swelling of the face/tongue/throat, SOB, or low BP? Yes Did it involve sudden or severe rash/hives, skin peeling, or any reaction on the inside of your mouth or nose? Yes Did you need to seek medical attention at a hospital or doctor's office? Yes When did it last happen?      15 years If all above answers are "NO", may proceed with cephalosporin use.     Family History  Problem Relation Age of Onset   Cancer Father    Hypertension Father    Heart disease Father    Asthma Father    Cancer Mother    Hypertension Mother    Pancreatic cancer Mother 23   Cancer Sister    Breast cancer Sister 98   Breast cancer Sister 89   Lung cancer Brother     Pancreatic cancer Sister 71   Cancer Sister     Prior to Admission medications   Medication Sig Start Date End Date Taking? Authorizing Provider  acetaminophen (TYLENOL) 325 MG tablet Take 650 mg by mouth every 6 (six) hours as needed for moderate pain or headache.    Yes [provider]  albuterol (VENTOLIN HFA) 108 (90 Base) MCG/ACT inhaler Inhale 2 puffs into the lungs every 6 (six) hours as needed for wheezing or shortness of breath. 01/07/21  Yes [name]., MD  bismuth subsalicylate (PEPTO BISMOL) 262 MG chewable tablet Chew 524 mg by mouth daily as needed for diarrhea or loose stools or indigestion.   Yes [provider]  clopidogrel (PLAVIX) 75 MG tablet Take 1 tablet (75 mg total) by mouth daily. 09/05/20  Yes [name]., MD  ELIQUIS 5 MG TABS tablet Take 5 mg by mouth 2 (two) times daily. 03/26/20  Yes [provider]  ezetimibe (ZETIA) 10 MG tablet Take 1 tablet (10 mg total) by mouth daily. 09/05/20  Yes [name]., MD  furosemide (LASIX) 20 MG tablet Take 20 mg by mouth.   Yes [provider]  insulin glargine (LANTUS SOLOSTAR) 100 UNIT/ML Solostar Pen Inject 14 Units into the skin at bedtime. 01/13/21  Yes [name]., MD  isosorbide mononitrate (IMDUR) 120 MG 24 hr tablet TAKE 1 TABLET BY MOUTH EVERY DAY Patient taking differently: Take 120 mg by mouth daily. 01/31/21  Yes [name]., MD  JARDIANCE 25 MG TABS tablet TAKE ONE TABLET o BEFORE BREAKFAST Patient taking differently: Take 25 mg by mouth daily. 02/06/21  Yes [name]., MD  loperamide (IMODIUM) 2 MG capsule Take 2 mg by mouth daily as needed for diarrhea or loose stools.   Yes [provider]  losartan (COZAAR) 25 MG tablet TAKE ONE TABLET BY MOUTH EVERYDAY AT BEDTIME Patient taking differently: Take 25 mg by mouth at bedtime. 11/12/20  Yes [name]., MD  metoprolol tartrate (LOPRESSOR) 100 MG  tablet Take 100 mg by mouth 2 (two) times daily. 10/03/18  Yes [provider]  nitroGLYCERIN (NITROSTAT) 0.4 MG SL tablet Place 1 tablet (0.4 mg total) under the tongue every 5 (five) minutes as needed for chest pain. 07/27/16  Yes Mody, Ulice Bold, MD  nystatin (NYSTATIN) powder Apply 1 application topically daily. Patient taking differently: Apply 1 application topically daily as needed. 02/14/20  Yes Rubie Maid, MD  ondansetron (ZOFRAN-ODT) 8 MG disintegrating tablet Take 1 tablet (8 mg total) by mouth every 8 (eight) hours as needed. Patient taking differently: Take 8 mg by mouth every 8 (eight) hours as needed for refractory nausea / vomiting. 01/05/21  Yes [name]., MD  pantoprazole (PROTONIX) 20 MG tablet Take 1 tablet (20 mg total) by mouth 2 (two) times daily. 06/10/20  Yes [name]., MD  rosuvastatin (CRESTOR) 40 MG tablet Take 1 tablet (40 mg total) by mouth daily. 11/17/20  Yes [name]., MD  sertraline (ZOLOFT) 100 MG tablet Take 1 tablet (100 mg total) by mouth daily. 12/12/20  Yes [name]., MD  TRULICITY 1.5 XN/2.3FT SOPN Inject 1.5mg  into THE SKIN ONCE A WEEK Patient taking differently: Inject 1.5 mg as directed once a week. 01/31/21  Yes [name]., MD  doxycycline (VIBRA-TABS) 100 MG tablet Take 1 tablet (100 mg total) by mouth 2 (two) times daily. Patient not taking: Reported on 02/08/2021 01/12/21   [name]., MD  glucose blood test strip Use to check blood sugars daily as instructed 12/23/20   [name]., MD  Insulin Pen Needle 32G X 4 MM MISC Use to inject insulin daily 12/23/20   [name]., MD  Lancets [facility] DELICA PLUS [id number]) MISC USE UP TO 4 TIMES DAILY AS DIRECTED 07/10/19   [name]., MD  sertraline (ZOLOFT) 50 MG tablet TAKE ONE TABLET BY MOUTH ONCE DAILY Patient not taking: Reported on 02/08/2021 02/06/21   [name]., MD  cetirizine  (ZYRTEC) 5 MG tablet Take 2 tablets (10 mg total) by mouth daily. 12/19/18 02/06/20  [name]., MD    Physical Exam: Constitutional: Moderately built and nourished. Vitals:   02/08/21 1845 02/08/21  1930 02/08/21 2000 02/08/21 2030  BP: (!) 165/75 (!) 167/78 (!) 171/82 (!) 162/84  Pulse: 72 72 70 66  Resp: 16 13 16 14   Temp:      TempSrc:      SpO2: 97% 98% 97% 100%   Eyes: Anicteric no pallor. ENMT: No discharge from the ears eyes nose and mouth. Neck: No mass felt.  No neck rigidity. Respiratory: No rhonchi or crepitations. Cardiovascular: S1-S2 heard. Abdomen: Soft nontender bowel sound present. Musculoskeletal: No edema. Skin: No rash. Neurologic: Alert awake oriented to time place and person.  Moves all extremities. Psychiatric: Appears normal.  Normal affect.   Labs on Admission: I have personally reviewed following labs and imaging studies  CBC: Recent Labs  Lab 02/08/21 1145  WBC 6.7  HGB 13.0  HCT 40.8  MCV 101.0*  PLT 119   Basic Metabolic Panel: Recent Labs  Lab 02/08/21 1145  NA 140  K 4.2  CL 109  CO2 25  GLUCOSE 120*  BUN 7*  CREATININE 0.68  CALCIUM 8.7*   GFR: CrCl cannot be calculated (Unknown ideal weight.). Liver Function Tests: No results for input(s): AST, ALT, ALKPHOS, BILITOT, PROT, ALBUMIN in the last 168 hours. No results for input(s): LIPASE, AMYLASE in the last 168 hours. No results for input(s): AMMONIA in the last 168 hours. Coagulation Profile: No results for input(s): INR, PROTIME in the last 168 hours. Cardiac Enzymes: No results for input(s): CKTOTAL, CKMB, CKMBINDEX, TROPONINI in the last 168 hours. BNP (last 3 results) No results for input(s): PROBNP in the last 8760 hours. HbA1C: No results for input(s): HGBA1C in the last 72 hours. CBG: No results for input(s): GLUCAP in the last 168 hours. Lipid Profile: No results for input(s): CHOL, HDL, LDLCALC, TRIG, CHOLHDL, LDLDIRECT in the last 72  hours. Thyroid Function Tests: No results for input(s): TSH, T4TOTAL, FREET4, T3FREE, THYROIDAB in the last 72 hours. Anemia Panel: No results for input(s): VITAMINB12, FOLATE, FERRITIN, TIBC, IRON, RETICCTPCT in the last 72 hours. Urine analysis:    Component Value Date/Time   COLORURINE YELLOW (A) 10/05/2020 0115   APPEARANCEUR CLEAR (A) 10/05/2020 0115   APPEARANCEUR Clear 12/24/2014 0905   LABSPEC 1.036 (H) 10/05/2020 0115   LABSPEC 1.030 09/14/2013 2123   PHURINE 6.0 10/05/2020 0115   GLUCOSEU >=500 (A) 10/05/2020 0115   GLUCOSEU Negative 09/14/2013 2123   HGBUR SMALL (A) 10/05/2020 0115   BILIRUBINUR NEGATIVE 10/05/2020 0115   BILIRUBINUR Negative 02/06/2020 1014   BILIRUBINUR Negative 12/24/2014 0905   BILIRUBINUR Negative 09/14/2013 2123   KETONESUR 5 (A) 10/05/2020 0115   PROTEINUR NEGATIVE 10/05/2020 0115   UROBILINOGEN 0.2 02/06/2020 1014   NITRITE NEGATIVE 10/05/2020 0115   LEUKOCYTESUR NEGATIVE 10/05/2020 0115   LEUKOCYTESUR 3+ 09/14/2013 2123   Sepsis Labs: @LABRCNTIP (procalcitonin:4,lacticidven:4) )No results found for this or any previous visit (from the past 240 hour(s)).   Radiological Exams on Admission: DG Chest 2 View  Result Date: 02/08/2021 CLINICAL DATA:  Chest pain EXAM: CHEST - 2 VIEW COMPARISON:  Radiograph 12/04/2020 FINDINGS: Unchanged cardiomediastinal silhouette with prior median sternotomy and CABG. No focal airspace consolidation. No pleural effusion. No visible pneumothorax. No acute osseous abnormality. Severe left glenohumeral osteoarthritis. Milder glenohumeral arthritis on the right. IMPRESSION: No evidence of acute cardiopulmonary disease. Electronically Signed   By: Maurine Simmering M.D.   On: 02/08/2021 12:36    EKG: Independently reviewed.  Normal sinus rhythm with LBBB.  Assessment/Plan Principal Problem:   Chest pain Active Problems:   Type  2 diabetes mellitus with complication, without long-term current use of insulin (HCC)   AF  (paroxysmal atrial fibrillation) (HCC)   B12 deficiency   Chronic diastolic CHF (congestive heart failure) (HCC)   Chronic daily headache    Chest pain with history of CAD status post CABG we will admit for further observation.  Cardiology has been notified.  We will continue patient's antiplatelet agents and statins beta-blockers and Imdur.  At the time of my exam patient is chest pain-free.  Since patient also has some associated nausea we will check LFTs lipase and sonogram of the right upper quadrant. Paroxysmal atrial fibrillation presently in sinus rhythm.  Since in anticipation of possible procedure will hold Eliquis and keep patient on heparin.  Continue beta-blockers for rate control. Diabetes mellitus type 2 on Lantus insulin at bedtime I will decrease the dose tonight because in anticipation of being n.p.o. in the morning.  Follow CBGs and metabolic panel closely. Macrocytosis will need further work-up as outpatient.  Follow CBC. History of diastolic dysfunction per 2D echo done in March 2022 with EF of 60 to 65% with grade 1 diastolic dysfunction appears compensated.  COVID test is pending.   DVT prophylaxis: Heparin. Code Status: Full code. Family Communication: Discussed with patient. Disposition Plan: Home. Consults called: Cardiology. Admission status: Observation.   Rise Patience MD [facility] Pager [phone].  If 7PM-7AM, please contact night-coverage [email or web address] Password [email or web address]  02/08/2021, 9:40 PM

## 2021-02-08 NOTE — ED Provider Notes (Signed)
Emergency Medicine Provider Triage Evaluation Note  Candace LARIS , a 70 y.o. female  was evaluated in triage.  Pt complains of sharp chest pain that has been coming and going for the past 3 days.  Yesterday and today it is gotten worse.  She denies a history of MI or arrhythmias.  Does endorse that she has a history of heart failure.  She has been experiencing some increased shortness of breath when she is walking. Review of Systems  Positive: Chest pain, shortness of breath Negative: NV  Physical Exam  BP 127/87 (BP Location: Right Arm)   Pulse 81   Temp 98.3 F (36.8 C) (Oral)   Resp 17   SpO2 100%  Gen:   Awake, no distress   Resp:  Normal effort  MSK:   Moves extremities without difficulty  Other:  RRR, CTAB  Medical Decision Making  Medically screening exam initiated at 11:40 AM.  Appropriate orders placed.  Candace [name] was informed that the remainder of the evaluation will be completed by another provider, this initial triage assessment does not replace that evaluation, and the importance of remaining in the ED until their evaluation is complete.  Chart shows an NSTEMI as well as COPD.   Rhae Hammock, PA-C 02/08/21 1141    Godfrey Pick, MD 02/09/21 0000

## 2021-02-08 NOTE — Progress Notes (Signed)
ANTICOAGULATION CONSULT NOTE - Initial Consult  Pharmacy Consult for heparin Indication: chest pain/ACS, history afib  Allergies  Allergen Reactions   Lisinopril Cough   Penicillins Swelling, Rash and Other (See Comments)    Did it involve swelling of the face/tongue/throat, SOB, or low BP? Yes Did it involve sudden or severe rash/hives, skin peeling, or any reaction on the inside of your mouth or nose? Yes Did you need to seek medical attention at a hospital or doctor's office? Yes When did it last happen?      15 years If all above answers are "NO", may proceed with cephalosporin use.     Patient Measurements: Weight: 81.6 kg (180 lb) Heparin Dosing Weight: 68kg  Vital Signs: Temp: 98.2 F (36.8 C) (11/13 1456) Temp Source: Oral (11/13 1456) BP: 162/84 (11/13 2030) Pulse Rate: 66 (11/13 2030)  Labs: Recent Labs    02/08/21 1145 02/08/21 1402  HGB 13.0  --   HCT 40.8  --   PLT 191  --   CREATININE 0.68  --   TROPONINIHS 6 6    Estimated Creatinine Clearance: 65.7 mL/min (by C-G formula based on SCr of 0.68 mg/dL).   Medical History: Past Medical History:  Diagnosis Date   Allergy    Anemia    Anxiety    Arrhythmia    Arthritis    Atrial fibrillation ([place])    Coronary artery disease    Depression    Diabetes mellitus without complication ([place])    Dyspnea    doe   Dysrhythmia    GERD (gastroesophageal reflux disease)    Headache    History of hiatal hernia    Hyperlipidemia    Hypertension    Myocardial infarction ([place])    2016, 04/2017   Myocardial infarction with cardiac rehabilitation [facility])    MI 2016/ CABG 8/17    FINISHED CARDIAC REHAB 3 WEEKS AGO   Panic attack    Pneumonia    Reflux    Stroke ([place]) 2015   showed up on MRI; no weakness noted   TIA (transient ischemic attack)    Voice tremor     Assessment: 70 year old female with history of PAF on apixaban prior to admit (last dose this am) presents to [facility] with chest pain. New orders  to transition patient to IV heparin. CBC appears within normal limits.   Will monitor aptt's given recent apixaban use until they correlate with heparin levels.   Goal of Therapy:  Heparin level 0.3-0.7 units/ml aPTT 66-102 seconds Monitor platelets by anticoagulation protocol: Yes   Plan:  No bolus given chronic apixaban use Start heparin infusion at 900 units/hr Check anti-Xa level and aptt in 8 hours and daily while on heparin Continue to monitor H&H and platelets  Erin Hearing PharmD., BCPS Clinical Pharmacist 02/08/2021 10:00 PM

## 2021-02-08 NOTE — ED Notes (Signed)
Pt transported to Ultrasound.  

## 2021-02-08 NOTE — ED Triage Notes (Signed)
Pt to triage via [facility] from [place] in [place].  Pt reports chest pain that radiates to L shoulder since Wednesday.  States it feels like indigestion.    UCC administered- ASA 324mg   NTG x 2

## 2021-02-08 NOTE — ED Provider Notes (Signed)
[facility] EMERGENCY DEPARTMENT Provider Note   CSN: [id number] Arrival date & time: 02/08/21  1116     History Chief Complaint  Patient presents with   Chest Pain    Candace [name] is a 70 y.o. female with history of atrial fibrillation, diabetes, myocardial infarction, congestive heart failure with preserved ejection fraction who presents the emergency department with a 5-day history of intermittent left-sided chest pain with radiation to the left arm.  Patient states that she was resting at home not doing any strenuous activity when she started having sharp chest pain.  Nothing seems to make it better or worse.  She has had similar symptoms in the past.  She has been taking Pepto-Bismol, Tums, and ibuprofen which offered no relief with her chest pain.  She was seen evaluated at urgent care was given aspirin and 3 nitroglycerin tablets which improved her chest pain prior to arrival.  She reports associated nausea, dyspnea on exertion, PND, and orthopnea.  She denies any fever, chills, cough, congestion however her family member did test positive for influenza a couple of weeks ago.  She also denies any leg pain, leg swelling, urinary complaints.  She currently rates her chest pain a 6/10 in severity.  Last echo was done in March which revealed ejection fraction of 60 to 65% with moderate left ventricular hypertrophy. Left heart cath revealed multi vessel disease.    Chest Pain     Past Medical History:  Diagnosis Date   Allergy    Anemia    Anxiety    Arrhythmia    Arthritis    Atrial fibrillation ([place])    Coronary artery disease    Depression    Diabetes mellitus without complication ([place])    Dyspnea    doe   Dysrhythmia    GERD (gastroesophageal reflux disease)    Headache    History of hiatal hernia    Hyperlipidemia    Hypertension    Myocardial infarction ([place])    2016, 04/2017   Myocardial infarction with cardiac rehabilitation [facility])    MI  2016/ CABG 8/17    FINISHED CARDIAC REHAB 3 WEEKS AGO   Panic attack    Pneumonia    Reflux    Stroke ([place]) 2015   showed up on MRI; no weakness noted   TIA (transient ischemic attack)    Voice tremor     Patient Active Problem List   Diagnosis Date Noted   Vertigo    Unsteady gait    Acute bronchitis 03/07/2019   Esophageal dysphagia    Stomach irritation    Gastric polyp    Esophageal lump    Hx of colonic polyp    Polyp of colon    Diverticulosis of large intestine without diverticulitis    PSVT (paroxysmal supraventricular tachycardia) ([place]) 10/03/2018   Abnormal ECG 07/05/2018   Tachycardia 03/27/2018   Pain in limb 02/28/2018   Chronic diastolic CHF (congestive heart failure) ([place]) 11/03/2017   TIA (transient ischemic attack) 11/03/2017   COPD suggested by initial evaluation ([place]) 08/23/2017   Acute and chronic respiratory failure with hypoxia ([place]) 08/23/2017   Postoperative state 08/15/2017   Incidental pulmonary nodule 08/01/2017   Non-ST elevation myocardial infarction (NSTEMI), subendocardial infarction, subsequent episode of care ([place]) 06/01/2017   B12 deficiency 12/01/2016   Vitamin D deficiency 11/04/2016   Depression with anxiety 09/30/2016   AF (paroxysmal atrial fibrillation) ([place]) 09/30/2016   Resting tremor 09/30/2016   Mild obstructive sleep  apnea 09/30/2016   Headache 08/18/2016   Unstable angina ([place]) 08/03/2016   Type 2 diabetes mellitus with complication, without long-term current use of insulin (HCC) 08/03/2016   Chronic daily headache 05/05/2016   Asthma 11/11/2015   S/P CABG x 4 11/10/2015   Coronary artery disease 11/07/2015   Carotid artery narrowing 02/08/2014   Chest pain 02/08/2014   Mixed hyperlipidemia 02/08/2014   Bilateral carotid artery stenosis 02/08/2014   Atypical chest pain 02/07/2014   Essential hypertension 02/07/2014   Awareness of heartbeats 02/07/2014   D (diarrhea) 10/05/2013   Gastroesophageal reflux disease  10/05/2013    Past Surgical History:  Procedure Laterality Date   ABDOMINAL HYSTERECTOMY     APPENDECTOMY  1975   ARTERY BIOPSY Right 04/26/2016   Procedure: BIOPSY TEMPORAL ARTERY;  Surgeon: Margaretha Sheffield, MD;  Location: [facility] ORS;  Service: ENT;  Laterality: Right;   CARDIAC CATHETERIZATION N/A 11/06/2015   Procedure: Left Heart Cath and Coronary Angiography;  Surgeon: Corey Skains, MD;  Location: [facility] CV LAB;  Service: Cardiovascular;  Laterality: N/A;   CESAREAN SECTION     COLONOSCOPY  2015   COLONOSCOPY WITH PROPOFOL N/A 11/03/2018   Procedure: COLONOSCOPY WITH PROPOFOL;  Surgeon: Virgel Manifold, MD;  Location: [facility] ENDOSCOPY;  Service: Endoscopy;  Laterality: N/A;   CORONARY ANGIOPLASTY  04/2017   [facility]   CORONARY ARTERY BYPASS GRAFT N/A 11/10/2015   Procedure: CORONARY ARTERY BYPASS GRAFTING (CABG), ON PUMP, TIMES FOUR, USING LEFT INTERNAL MAMMARY ARTERY, BILATERAL GREATER SAPHENOUS VEINS HARVESTED ENDOSCOPICALLY;  Surgeon: [name], MD;  Location: [facility];  Service: Open Heart Surgery;  Laterality: N/A;  LIMA-LAD; SEQ SVG-OM1-OM2; SVG-PL   CORONARY STENT INTERVENTION N/A 08/05/2016   Procedure: Coronary Stent Intervention;  Surgeon: Isaias Cowman, MD;  Location: [place] [place] [facility];  Service: Cardiovascular;  Laterality: N/A;   ESOPHAGOGASTRODUODENOSCOPY (EGD) WITH PROPOFOL N/A 11/03/2018   Procedure: ESOPHAGOGASTRODUODENOSCOPY (EGD) WITH PROPOFOL;  Surgeon: Virgel Manifold, MD;  Location: [facility] ENDOSCOPY;  Service: Endoscopy;  Laterality: N/A;   HYSTERECTOMY ABDOMINAL WITH SALPINGO-OOPHORECTOMY Bilateral 08/15/2017   Procedure: HYSTERECTOMY ABDOMINAL WITH BILATERAL SALPINGO-OOPHORECTOMY;  Surgeon: Rubie Maid, MD;  Location: [facility] ORS;  Service: Gynecology;  Laterality: Bilateral;   LEFT HEART CATH AND CORONARY ANGIOGRAPHY N/A 08/05/2016   Procedure: Left Heart Cath and Coronary Angiography;  Surgeon: Isaias Cowman, MD;  Location:  [place] [facility] LAB;  Service: Cardiovascular;  Laterality: N/A;   LEFT HEART CATH AND CORS/GRAFTS ANGIOGRAPHY N/A 09/19/2018   Procedure: LEFT HEART CATH AND CORS/GRAFTS ANGIOGRAPHY;  Surgeon: Corey Skains, MD;  Location: [facility] LAB;  Service: Cardiovascular;  Laterality: N/A;   LEFT HEART CATH AND CORS/GRAFTS ANGIOGRAPHY N/A 08/30/2019   Procedure: LEFT HEART CATH AND CORS/GRAFTS ANGIOGRAPHY;  Surgeon: Teodoro Spray, MD;  Location: [place] [facility] LAB;  Service: Cardiovascular;  Laterality: N/A;   OOPHORECTOMY     TEE WITHOUT CARDIOVERSION N/A 11/10/2015   Procedure: TRANSESOPHAGEAL ECHOCARDIOGRAM (TEE);  Surgeon: [name], MD;  Location: [facility];  Service: Open Heart Surgery;  Laterality: N/A;   TUBAL LIGATION       OB History     Gravida  5   Para  5   Term  3   Preterm  2   AB      Living         SAB      IAB      Ectopic      Multiple  Live Births  2           Family History  Problem Relation Age of Onset   Cancer Father    Hypertension Father    Heart disease Father    Asthma Father    Cancer Mother    Hypertension Mother    Pancreatic cancer Mother 23   Cancer Sister    Breast cancer Sister 70   Breast cancer Sister 36   Lung cancer Brother    Pancreatic cancer Sister 66   Cancer Sister     Social History   Tobacco Use   Smoking status: Former    Packs/day: 0.50    Types: Cigarettes    Quit date: 10/07/2001    Years since quitting: 19.3   Smokeless tobacco: Never  Vaping Use   Vaping Use: Never used  Substance Use Topics   Alcohol use: No    Alcohol/week: 0.0 standard drinks   Drug use: No    Home Medications Prior to Admission medications   Medication Sig Start Date End Date Taking? Authorizing Provider  acetaminophen (TYLENOL) 325 MG tablet Take 650 mg by mouth every 6 (six) hours as needed for moderate pain or headache.    Yes [provider]  albuterol (VENTOLIN HFA) 108 (90 Base) MCG/ACT  inhaler Inhale 2 puffs into the lungs every 6 (six) hours as needed for wheezing or shortness of breath. 01/07/21  Yes [name]., MD  bismuth subsalicylate (PEPTO BISMOL) 262 MG chewable tablet Chew 524 mg by mouth daily as needed for diarrhea or loose stools or indigestion.   Yes [provider]  clopidogrel (PLAVIX) 75 MG tablet Take 1 tablet (75 mg total) by mouth daily. 09/05/20  Yes [name]., MD  ELIQUIS 5 MG TABS tablet Take 5 mg by mouth 2 (two) times daily. 03/26/20  Yes [provider]  ezetimibe (ZETIA) 10 MG tablet Take 1 tablet (10 mg total) by mouth daily. 09/05/20  Yes [name]., MD  furosemide (LASIX) 20 MG tablet Take 20 mg by mouth.   Yes [provider]  insulin glargine (LANTUS SOLOSTAR) 100 UNIT/ML Solostar Pen Inject 14 Units into the skin at bedtime. 01/13/21  Yes [name]., MD  isosorbide mononitrate (IMDUR) 120 MG 24 hr tablet TAKE 1 TABLET BY MOUTH EVERY DAY Patient taking differently: Take 120 mg by mouth daily. 01/31/21  Yes [name]., MD  JARDIANCE 25 MG TABS tablet TAKE ONE TABLET o BEFORE BREAKFAST Patient taking differently: Take 25 mg by mouth daily. 02/06/21  Yes [name]., MD  loperamide (IMODIUM) 2 MG capsule Take 2 mg by mouth daily as needed for diarrhea or loose stools.   Yes [provider]  losartan (COZAAR) 25 MG tablet TAKE ONE TABLET BY MOUTH EVERYDAY AT BEDTIME Patient taking differently: Take 25 mg by mouth at bedtime. 11/12/20  Yes [name]., MD  metoprolol tartrate (LOPRESSOR) 100 MG tablet Take 100 mg by mouth 2 (two) times daily. 10/03/18  Yes [provider]  nitroGLYCERIN (NITROSTAT) 0.4 MG SL tablet Place 1 tablet (0.4 mg total) under the tongue every 5 (five) minutes as needed for chest pain. 07/27/16  Yes Mody, Ulice Bold, MD  nystatin (NYSTATIN) powder Apply 1 application topically daily. Patient taking differently: Apply 1  application topically daily as needed. 02/14/20  Yes Rubie Maid, MD  ondansetron (ZOFRAN-ODT) 8 MG disintegrating tablet Take 1 tablet (8 mg total)  by mouth every 8 (eight) hours as needed. Patient taking differently: Take 8 mg by mouth every 8 (eight) hours as needed for refractory nausea / vomiting. 01/05/21  Yes [name]., MD  pantoprazole (PROTONIX) 20 MG tablet Take 1 tablet (20 mg total) by mouth 2 (two) times daily. 06/10/20  Yes [name]., MD  rosuvastatin (CRESTOR) 40 MG tablet Take 1 tablet (40 mg total) by mouth daily. 11/17/20  Yes [name]., MD  sertraline (ZOLOFT) 100 MG tablet Take 1 tablet (100 mg total) by mouth daily. 12/12/20  Yes [name]., MD  TRULICITY 1.5 YC/1.4GY SOPN Inject 1.5mg  into THE SKIN ONCE A WEEK Patient taking differently: Inject 1.5 mg as directed once a week. 01/31/21  Yes [name]., MD  doxycycline (VIBRA-TABS) 100 MG tablet Take 1 tablet (100 mg total) by mouth 2 (two) times daily. Patient not taking: Reported on 02/08/2021 01/12/21   [name]., MD  glucose blood test strip Use to check blood sugars daily as instructed 12/23/20   [name]., MD  Insulin Pen Needle 32G X 4 MM MISC Use to inject insulin daily 12/23/20   [name]., MD  Lancets [place] DELICA PLUS [id number]) MISC USE UP TO 4 TIMES DAILY AS DIRECTED 07/10/19   [name]., MD  sertraline (ZOLOFT) 50 MG tablet TAKE ONE TABLET BY MOUTH ONCE DAILY Patient not taking: Reported on 02/08/2021 02/06/21   [name]., MD  cetirizine (ZYRTEC) 5 MG tablet Take 2 tablets (10 mg total) by mouth daily. 12/19/18 02/06/20  [name]., MD    Allergies    Lisinopril and Penicillins  Review of Systems   Review of Systems  Cardiovascular:  Positive for chest pain.  All other systems reviewed and are negative.  Physical Exam Updated Vital Signs BP (!) 171/82   Pulse 70    Temp 98.2 F (36.8 C) (Oral)   Resp 16   SpO2 97%   Physical Exam Vitals and nursing note reviewed.  Constitutional:      General: She is not in acute distress.    Appearance: Normal appearance.  HENT:     Head: Normocephalic and atraumatic.  Eyes:     General:        Right eye: No discharge.        Left eye: No discharge.  Neck:     Vascular: No JVD.  Cardiovascular:     Comments: Regular rate and rhythm.  S1/S2 are distinct without any evidence of murmur, rubs, or gallops.  Radial pulses are 2+ bilaterally.  Dorsalis pedis pulses are 2+ bilaterally.  No evidence of pedal edema. Pulmonary:     Comments: Clear to auscultation bilaterally.  Normal effort.  No respiratory distress.  No evidence of wheezes, rales, or rhonchi heard throughout. Abdominal:     General: Bowel sounds are normal. There is no distension.     Tenderness: There is no abdominal tenderness. There is no guarding or rebound.     Comments: Obese abdomen.   Musculoskeletal:        General: Normal range of motion.     Cervical back: Neck supple.  Skin:    General: Skin is warm and dry.     Findings: No rash.  Neurological:     General: No focal deficit present.     Mental Status: She is alert.  Psychiatric:  Mood and Affect: Mood normal.        Behavior: Behavior normal.    ED Results / Procedures / Treatments   Labs (all labs ordered are listed, but only abnormal results are displayed) Labs Reviewed  BASIC METABOLIC PANEL - Abnormal; Notable for the following components:      Result Value   Glucose, Bld 120 (*)    BUN 7 (*)    Calcium 8.7 (*)    All other components within normal limits  CBC - Abnormal; Notable for the following components:   MCV 101.0 (*)    All other components within normal limits  BRAIN NATRIURETIC PEPTIDE - Abnormal; Notable for the following components:   B Natriuretic Peptide 197.6 (*)    All other components within normal limits  RESP PANEL BY RT-PCR (FLU A&B,  COVID) [id number]  TROPONIN I (HIGH SENSITIVITY)  TROPONIN I (HIGH SENSITIVITY)    EKG EKG Interpretation  Date/Time:  Sunday February 08 2021 11:24:33 EST Ventricular Rate:  81 PR Interval:  208 QRS Duration: 132 QT Interval:  410 QTC Calculation: 476 R Axis:   15 Text Interpretation: Normal sinus rhythm Left bundle branch block Abnormal ECG NO sig change from Sept 2022 ecg, chronic LBBB Confirmed by Octaviano Glow [phone]) on 02/08/2021 3:30:43 PM  Radiology DG Chest 2 View  Result Date: 02/08/2021 CLINICAL DATA:  Chest pain EXAM: CHEST - 2 VIEW COMPARISON:  Radiograph 12/04/2020 FINDINGS: Unchanged cardiomediastinal silhouette with prior median sternotomy and CABG. No focal airspace consolidation. No pleural effusion. No visible pneumothorax. No acute osseous abnormality. Severe left glenohumeral osteoarthritis. Milder glenohumeral arthritis on the right. IMPRESSION: No evidence of acute cardiopulmonary disease. Electronically Signed   By: Maurine Simmering M.D.   On: 02/08/2021 12:36    Procedures Procedures   Medications Ordered in ED Medications - No data to display  ED Course  I have reviewed the triage vital signs and the nursing notes.  Pertinent labs & imaging results that were available during my care of the patient were reviewed by me and considered in my medical decision making (see chart for details).  Clinical Course as of 02/08/21 2043  Nancy Fetter Feb 08, 2021  1630 I discussed this case with my attending physician who cosigned this note including patient's presenting symptoms, physical exam, and planned diagnostics and interventions. Attending physician stated agreement with plan or made changes to plan which were implemented.      [CF]  [id number] Cardiology consulted.  [CF]  [facility] consulted.  [CF]  1925 Spoke with Jonne Ply with Cardiology. He agrees to consult on the patient.  [CF]  2037 I spoke with the hospitalist who agrees to admit the patient.  [CF]     Clinical Course User Index [CF] Cherrie Gauze   MDM Rules/Calculators/A&P                          Candace [name] is a 70 y.o. female with history of atrial fibrillation, myocardial infarction, and congestive heart failure with preserved ejection fraction who presents to the emergency department for further evaluation of chest pain dyspnea on exertion.  Initial work-up was ordered in triage to include CBC, BMP, BNP, troponin, EKG, and chest x-ray.  Clinically, the patient does not appear volume overloaded and she is resting comfortably in the bed.  EKG without any signs of overt arrhythmias.  CBC is without any leukocytosis or anemia.  BMP was grossly normal.  BNP was mildly elevated.  Initial and delta troponin were both negative.  Chest x-ray was negative.  Given clinical picture and the fact that she is still at a 6/10 chest pain down from a 10/10 with 3 nitroglycerin and her extensive cardiac history I believe she would benefit from further evaluation in the hospital.  I spoke with cardiology who agrees to consult on the patient with possible stress test or CT coronary in the morning.  I will admit the patient to the hospitalist service.   Final Clinical Impression(s) / ED Diagnoses Final diagnoses:  Chest pain, unspecified type    Rx / DC Orders ED Discharge Orders     None        Cherrie Gauze 02/08/21 2043    Wyvonnia Dusky, MD 02/08/21 2158

## 2021-02-08 NOTE — Consult Note (Signed)
Cardiology Consult    Patient ID: Candace [name] MRN: [id number], DOB/AGE: 1950-04-25   Admit date: 02/08/2021 Date of Consult: 02/08/2021 Requesting Provider: Gean Birchwood, MD  PCP:  [name]., MD   Cardiologist:  Serafina Royals, MD [facility])  Patient Profile    Candace [name] is a 70 y.o. female with a history of uncontrolled type 2 diabetes with polyneuropathy, hypertension, CAD s/p 4v CABG in 2017 (LIMA-LAD, SVG-PDA, and sequential SVG-OM1-OM2) and PCI of OM graft in 2018, bilateral carotid disease, paroxysmal AF, and chronic LBBB. She is being seen today (02/08/2021) for the evaluation of chest pain.  History of Present Illness    Candace [name] reports frequent chest pain episodes for the past several years. She says these typically feel like uncomfortable sharp stabbing pains originating from her sternum, occasionally have radiated across her chest to the left side but not with this most recent episode. The episodes usually last days, with her current episode very typical, starting this past Wednesday (5 days ago), and are non-exertional. However, during this episode she started having significant nausea on Friday and had difficulty sleeping due to the the pain that night. Partially improved with Zofran and Tylenol. The nausea is what made her decide to seek further evaluation. She has chronic exertional dyspnea, but no acute change with this episode. Denies orthopnea or leg swelling. Her current chest pain feels like pins and needles. It can sometimes involve her shoulder with radiation to the elbow, but this seems separate and has previously been attributed to severe left glenohumoral arthritis. She denies any pleuritic pain.   Troponin normal at 6 (x2), ECG shows baseline LBBB without changes. Labs otherwise unrevealing.   Regarding previous evaluations, she has undergone extensive prior cardiovascular evaluation for chest pain symptoms. Last cath at [facility]  by Dr. Denman [name] for NSTEMI on 4/15 (trop peak 400), which revealed severe 3v CAD including 70-80% mid LAD at D1 take-off, 60-70% diffuse LCx disease (small vessel), 90% distal RCA, patent SVG to RCA, occluded SVG to OM (likely culprit), and atretic LIMA to LAD. Considered of intervention on LAD in future if refractory to medical therapy, but not recommended at that time. Subsequent exercise SPECT MPI performed 08/29/20 was read as normal.    Past Medical History   Past Medical History:  Diagnosis Date   Allergy    Anemia    Anxiety    Arrhythmia    Arthritis    Atrial fibrillation ([place])    Coronary artery disease    Depression    Diabetes mellitus without complication ([place])    Dyspnea    doe   Dysrhythmia    GERD (gastroesophageal reflux disease)    Headache    History of hiatal hernia    Hyperlipidemia    Hypertension    Myocardial infarction ([place])    2016, 04/2017   Myocardial infarction with cardiac rehabilitation [facility])    MI 2016/ CABG 8/17    FINISHED CARDIAC REHAB 3 WEEKS AGO   Panic attack    Pneumonia    Reflux    Stroke ([place]) 2015   showed up on MRI; no weakness noted   TIA (transient ischemic attack)    Voice tremor     Past Surgical History:  Procedure Laterality Date   ABDOMINAL HYSTERECTOMY     APPENDECTOMY  1975   ARTERY BIOPSY Right 04/26/2016   Procedure: BIOPSY TEMPORAL ARTERY;  Surgeon: Margaretha Sheffield, MD;  Location: [facility] ORS;  Service: ENT;  Laterality: Right;   CARDIAC CATHETERIZATION N/A 11/06/2015   Procedure: Left Heart Cath and Coronary Angiography;  Surgeon: Corey Skains, MD;  Location: [place] CV LAB;  Service: Cardiovascular;  Laterality: N/A;   CESAREAN SECTION     COLONOSCOPY  2015   COLONOSCOPY WITH PROPOFOL N/A 11/03/2018   Procedure: COLONOSCOPY WITH PROPOFOL;  Surgeon: Virgel Manifold, MD;  Location: [facility] ENDOSCOPY;  Service: Endoscopy;  Laterality: N/A;   CORONARY ANGIOPLASTY  04/2017   [place]   CORONARY ARTERY  BYPASS GRAFT N/A 11/10/2015   Procedure: CORONARY ARTERY BYPASS GRAFTING (CABG), ON PUMP, TIMES FOUR, USING LEFT INTERNAL MAMMARY ARTERY, BILATERAL GREATER SAPHENOUS VEINS HARVESTED ENDOSCOPICALLY;  Surgeon: [name], MD;  Location: [place];  Service: Open Heart Surgery;  Laterality: N/A;  LIMA-LAD; SEQ SVG-OM1-OM2; SVG-PL   CORONARY STENT INTERVENTION N/A 08/05/2016   Procedure: Coronary Stent Intervention;  Surgeon: Isaias Cowman, MD;  Location: [place] CV LAB;  Service: Cardiovascular;  Laterality: N/A;   ESOPHAGOGASTRODUODENOSCOPY (EGD) WITH PROPOFOL N/A 11/03/2018   Procedure: ESOPHAGOGASTRODUODENOSCOPY (EGD) WITH PROPOFOL;  Surgeon: Virgel Manifold, MD;  Location: [facility] ENDOSCOPY;  Service: Endoscopy;  Laterality: N/A;   HYSTERECTOMY ABDOMINAL WITH SALPINGO-OOPHORECTOMY Bilateral 08/15/2017   Procedure: HYSTERECTOMY ABDOMINAL WITH BILATERAL SALPINGO-OOPHORECTOMY;  Surgeon: Rubie Maid, MD;  Location: [facility] ORS;  Service: Gynecology;  Laterality: Bilateral;   LEFT HEART CATH AND CORONARY ANGIOGRAPHY N/A 08/05/2016   Procedure: Left Heart Cath and Coronary Angiography;  Surgeon: Isaias Cowman, MD;  Location: [place] CV LAB;  Service: Cardiovascular;  Laterality: N/A;   LEFT HEART CATH AND CORS/GRAFTS ANGIOGRAPHY N/A 09/19/2018   Procedure: LEFT HEART CATH AND CORS/GRAFTS ANGIOGRAPHY;  Surgeon: Corey Skains, MD;  Location: [place] [facility];  Service: Cardiovascular;  Laterality: N/A;   LEFT HEART CATH AND CORS/GRAFTS ANGIOGRAPHY N/A 08/30/2019   Procedure: LEFT HEART CATH AND CORS/GRAFTS ANGIOGRAPHY;  Surgeon: Teodoro Spray, MD;  Location: [place] CV LAB;  Service: Cardiovascular;  Laterality: N/A;   OOPHORECTOMY     TEE WITHOUT CARDIOVERSION N/A 11/10/2015   Procedure: TRANSESOPHAGEAL ECHOCARDIOGRAM (TEE);  Surgeon: [name], MD;  Location: [place];  Service: Open Heart Surgery;  Laterality: N/A;   TUBAL LIGATION       Allergies  Allergen  Reactions   Lisinopril Cough   Penicillins Swelling, Rash and Other (See Comments)    Did it involve swelling of the face/tongue/throat, SOB, or low BP? Yes Did it involve sudden or severe rash/hives, skin peeling, or any reaction on the inside of your mouth or nose? Yes Did you need to seek medical attention at a hospital or doctor's office? Yes When did it last happen?      15 years If all above answers are "NO", may proceed with cephalosporin use.    Inpatient Medications     insulin aspart  0-9 Units Subcutaneous TID WC    Family History    Family History  Problem Relation Age of Onset   Cancer Father    Hypertension Father    Heart disease Father    Asthma Father    Cancer Mother    Hypertension Mother    Pancreatic cancer Mother 64   Cancer Sister    Breast cancer Sister 15   Breast cancer Sister 62   Lung cancer Brother    Pancreatic cancer Sister 61   Cancer Sister    She indicated that her mother is deceased. She indicated that her father is deceased.  She indicated that only one of her three sisters is alive. She indicated that her brother is deceased.   Social History    Social History   Socioeconomic History   Marital status: Divorced    Spouse name: Not on file   Number of children: 3   Years of education: Not on file   Highest education level: Some college, no degree  Occupational History   Occupation: retired  Tobacco Use   Smoking status: Former    Packs/day: 0.50    Types: Cigarettes    Quit date: 10/07/2001    Years since quitting: 19.3   Smokeless tobacco: Never  Vaping Use   Vaping Use: Never used  Substance and Sexual Activity   Alcohol use: No    Alcohol/week: 0.0 standard drinks   Drug use: No   Sexual activity: Not Currently  Other Topics Concern   Not on file  Social History Narrative   Lives at home alone   Social Determinants of Health   Financial Resource Strain: Low Risk    Difficulty of Paying Living Expenses: Not hard  at all  Food Insecurity: No Food Insecurity   Worried About Charity fundraiser in the Last Year: Never true   [place] in the Last Year: Never true  Transportation Needs: No Transportation Needs   Lack of Transportation (Medical): No   Lack of Transportation (Non-Medical): No  Physical Activity: Inactive   Days of Exercise per Week: 0 days   Minutes of Exercise per Session: 0 min  Stress: No Stress Concern Present   Feeling of Stress : Not at all  Social Connections: Moderately Isolated   Frequency of Communication with Friends and Family: More than three times a week   Frequency of Social Gatherings with Friends and Family: More than three times a week   Attends Religious Services: More than 4 times per year   Active Member of [employer] or Organizations: No   Attends Archivist Meetings: Never   Marital Status: Divorced  Human resources officer Violence: Not At Risk   Fear of Current or Ex-Partner: No   Emotionally Abused: No   Physically Abused: No   Sexually Abused: No     Review of Systems    A comprehensive review of systems was performed with pertinent positive and negatives findings noted in the HPI.  Physical Exam    Blood pressure (!) 142/81, pulse 66, temperature 97.9 F (36.6 C), temperature source Oral, resp. rate 14, height 5\' 2"  (1.575 m), weight 81.6 kg, SpO2 99 %.    No intake or output data in the 24 hours ending 02/08/21 2303 Wt Readings from Last 3 Encounters:  02/08/21 81.6 kg  12/25/20 74.4 kg  10/04/20 75.8 kg    CONSTITUTIONAL: alert and conversant, well-appearing, nourished, no distress HEENT: normal NECK: no JVD, no masses CARDIAC: Regular rhythm. Normal S1/S2, no S3/S4. No murmur. No friction rub.  VASCULAR: Radial pulses intact bilaterally. No carotid bruits. PULMONARY/CHEST WALL: there is palpable separation of the inferior half of the sternum at the prior sternotomy site, no crepitus. Normal breath sounds bilaterally, normal work of  breathing ABDOMINAL: soft, non-tender, non-distended EXTREMITIES: no edema, no muscle atrophy, warm and well-perfused SKIN: Dry and intact without apparent rashes or wounds. No peripheral cyanosis. NEUROLOGIC: alert, no abnormal movements, cranial nerves grossly intact. PSYCH: normal affect, normal speech and language   Labs    Recent Labs    02/08/21 1145 02/08/21 1402  TROPONINIHS  6 6   Lab Results  Component Value Date   WBC 6.7 02/08/2021   HGB 13.0 02/08/2021   HCT 40.8 02/08/2021   MCV 101.0 (H) 02/08/2021   PLT 191 02/08/2021    Recent Labs  Lab 02/08/21 1145  NA 140  K 4.2  CL 109  CO2 25  BUN 7*  CREATININE 0.68  CALCIUM 8.7*  GLUCOSE 120*   Lab Results  Component Value Date   CHOL 164 08/11/2020   HDL 57 08/11/2020   LDLCALC 90 08/11/2020   TRIG 94 08/11/2020   No results found for: DDIMER Recent Labs    10/04/20 2021 02/08/21 1145  BNP 87.0 197.6*   No results for input(s): PROBNP in the last 8760 hours.    Radiology Studies    DG Chest 2 View  Result Date: 02/08/2021 CLINICAL DATA:  Chest pain EXAM: CHEST - 2 VIEW COMPARISON:  Radiograph 12/04/2020 FINDINGS: Unchanged cardiomediastinal silhouette with prior median sternotomy and CABG. No focal airspace consolidation. No pleural effusion. No visible pneumothorax. No acute osseous abnormality. Severe left glenohumeral osteoarthritis. Milder glenohumeral arthritis on the right. IMPRESSION: No evidence of acute cardiopulmonary disease. Electronically Signed   By: Maurine Simmering M.D.   On: 02/08/2021 12:36   US Abdomen Limited RUQ (LIVER/GB)  Result Date: 02/08/2021 CLINICAL DATA:  Nausea and chest pain. EXAM: ULTRASOUND ABDOMEN LIMITED RIGHT UPPER QUADRANT COMPARISON:  Ultrasound 01/18/2019.  CT 10/05/2020 FINDINGS: Gallbladder: Physiologically distended. No gallstones or wall thickening visualized. No sonographic Murphy sign noted by sonographer. Common bile duct: Diameter: 3 mm. Liver: No focal  lesion identified. Minimally increased in parenchymal echogenicity. Portal vein is patent on color Doppler imaging with normal direction of blood flow towards the liver. Other: No right upper quadrant ascites. IMPRESSION: 1. Mild hepatic steatosis. 2. Normal sonographic appearance of the gallbladder and biliary tree. Electronically Signed   By: Keith Rake M.D.   On: 02/08/2021 21:57    ECG & Cardiac Imaging    ECG shows NSR with LBBB and LAE, unchanged from prior - personally reviewed.  Left Heart Catheterization and Coronary Angiography [facility], 07/11/2020):  Aortic pressure: 163/68 mm Hg (mean 108 mm Hg)     Left ventricular filling pressure: 136/12 (LVEDP = 15  mmHg)   Dominance: Right   Left Main:  The left main coronary artery (LMCA) is a large-caliber vessel  that originates from the left coronary sinus. It bifurcates into the left  anterior descending (LAD) and left circumflex (LCx) arteries. There is no  angiographic evidence of significant flow limiting disease in the LMCA.   LAD:  The LAD is a large-caliber vessel that gives off 1 diagonal (D)  branches before it wraps around the apex. D1 is a medium-caliber vessel .  There are moderate luminal irregularities throughout the LAD along with a  70-80% mid LAD lesion at the D1 takeoff.   Left Circumflex:  The LCx is a large-caliber vessel that gives off 2  obtuse marginal (OM) branches. OM1 is a small-caliber vessel. OM2 is a  small-caliber vessel. There are moderate luminal irregularities throughout  the vessel. The LCX terminates as a small vessel in the AV groove.   Right Coronary:  The right coronary artery (RCA) is a large-caliber vessel  originating from the right coronary sinus. It bifurcates distally into the  posterior descending artery (PDA) and a posterolateral (PL) branch  consistent with a right dominant system. There are moderate luminal  irregularieis  throughout the vessel along with  severe 90% distal RCA   disease.  The SVG graft backfills via the native RCA.   Bypass Graft Angiography:  LIMA-LAD: severely atretic  SVG- RCA : patent  SVG- OM: proximally occluded   Access Site(s): left femoral artery  Arterial Closure: Manual pressure   Findings:  1. Severe 3v CAD including 70-80% mid LAD, 60-70% diffuse LCx disease (small vessel) and 90% distal RCA.    2. Patent SVG to RCA, Occluded SVG to OM (likely culprit), Atretic LIMA to LAD  3. Normal left ventricular filling pressures (LVEDP = 15  mmHg)   Recommendations:  1. Aggressive secondary prevention.  2. Follow up with primary cardiologist.  3. Can consider PCI of LAD in the future if symptoms are not controlled  with medical management   TTE 06/18/2020:  1. Left ventricular ejection fraction, by estimation, is 60 to 65%. The  left ventricle has normal function. The left ventricle has no regional  wall motion abnormalities. There is moderate left ventricular hypertrophy.  Left ventricular diastolic  parameters are consistent with Grade I diastolic dysfunction (impaired  relaxation).   2. Right ventricular systolic function is normal. The right ventricular  size is mildly enlarged.   3. The mitral valve is grossly normal. Mild mitral valve regurgitation.   4. The aortic valve is calcified. Aortic valve regurgitation is trivial.  Mild aortic valve sclerosis is present, with no evidence of aortic valve  stenosis.    Assessment & Plan    Chronic recurrent chest pain, non-anginal, no evidence of ACS. CAD s/p 4v CABG in 2017 (LIMA-LAD, SVG-PDA, and sequential SVG-OM1-OM2), PCI of OM graft in 2018, small vessel POBA in 2019 [facility]), and medically-treated NSTEMI 06/2020, at which time she was found to have atretic LIMA graft and occluded OM vein graft felt to be the culprit (no intervention).  Paroxysmal AF, currently in sinus rhythm, on Eliquis (no AAD). Bilateral carotid artery disease, stable  Uncontrolled type 2 DM with  polyneuropathy Palpable sternal defect, possible separation/dehiscence  - Would discuss optimal imaging technique to evaluate for separation of the prior sternotomy with radiology. This could be the origin of her chest pain. - Needs significant optimization of diabetic control  - I do not think PCI would provide relief of her current symptoms, but will keep NPO for now. - Continue Plavix, rosuvastatin, and Zetia for secondary prevention - Continue metoprolol and ARB.  - Agree with holding Eliquis for potential procedure; on heparin infusion - Consider work-up for other non-cardiac etiologies of her chest pain.   Signed, Marykay Lex, MD 02/08/2021, 11:03 PM  For questions or updates, please contact   Please consult [email or web address] for contact info under Cardiology/STEMI.

## 2021-02-08 NOTE — ED Notes (Signed)
Pt given [place] sandwich and drink per Myna Bright PA

## 2021-02-09 ENCOUNTER — Observation Stay (HOSPITAL_COMMUNITY): Payer: Medicare Other

## 2021-02-09 ENCOUNTER — Telehealth: Payer: Self-pay

## 2021-02-09 DIAGNOSIS — I700 Atherosclerosis of aorta: Secondary | ICD-10-CM | POA: Diagnosis not present

## 2021-02-09 DIAGNOSIS — R079 Chest pain, unspecified: Secondary | ICD-10-CM | POA: Diagnosis not present

## 2021-02-09 DIAGNOSIS — R0789 Other chest pain: Secondary | ICD-10-CM | POA: Diagnosis not present

## 2021-02-09 LAB — GLUCOSE, CAPILLARY
Glucose-Capillary: 108 mg/dL — ABNORMAL HIGH (ref 70–99)
Glucose-Capillary: 92 mg/dL (ref 70–99)

## 2021-02-09 LAB — APTT: aPTT: 106 seconds — ABNORMAL HIGH (ref 24–36)

## 2021-02-09 LAB — COMPREHENSIVE METABOLIC PANEL WITH GFR
ALT: 11 U/L (ref 0–44)
AST: 16 U/L (ref 15–41)
Albumin: 3.4 g/dL — ABNORMAL LOW (ref 3.5–5.0)
Alkaline Phosphatase: 76 U/L (ref 38–126)
Anion gap: 6 (ref 5–15)
BUN: 8 mg/dL (ref 8–23)
CO2: 25 mmol/L (ref 22–32)
Calcium: 9 mg/dL (ref 8.9–10.3)
Chloride: 108 mmol/L (ref 98–111)
Creatinine, Ser: 0.69 mg/dL (ref 0.44–1.00)
GFR, Estimated: >60 mL/min (ref >60)
Glucose, Bld: 104 mg/dL — ABNORMAL HIGH (ref 70–99)
Potassium: 3.9 mmol/L (ref 3.5–5.1)
Sodium: 139 mmol/L (ref 135–145)
Total Bilirubin: 0.4 mg/dL (ref 0.3–1.2)
Total Protein: 6.1 g/dL — ABNORMAL LOW (ref 6.5–8.1)

## 2021-02-09 LAB — CBC
HCT: 38.7 % (ref 36.0–46.0)
Hemoglobin: 12.1 g/dL (ref 12.0–15.0)
MCH: 31.1 pg (ref 26.0–34.0)
MCHC: 31.3 g/dL (ref 30.0–36.0)
MCV: 99.5 fL (ref 80.0–100.0)
Platelets: 180 10*3/uL (ref 150–400)
RBC: 3.89 MIL/uL (ref 3.87–5.11)
RDW: 12.9 % (ref 11.5–15.5)
WBC: 6 10*3/uL (ref 4.0–10.5)
nRBC: 0 % (ref 0.0–0.2)

## 2021-02-09 LAB — HEPARIN LEVEL (UNFRACTIONATED): Heparin Unfractionated: 0.75 [IU]/mL — ABNORMAL HIGH (ref 0.30–0.70)

## 2021-02-09 LAB — URINALYSIS, MICROSCOPIC (REFLEX)

## 2021-02-09 LAB — URINALYSIS, ROUTINE W REFLEX MICROSCOPIC
Bilirubin Urine: NEGATIVE
Glucose, UA: >=500 mg/dL — AB
Hgb urine dipstick: NEGATIVE
Ketones, ur: NEGATIVE mg/dL
Nitrite: NEGATIVE
Protein, ur: NEGATIVE mg/dL
Specific Gravity, Urine: 1.02 (ref 1.005–1.030)
pH: 6.5 (ref 5.0–8.0)

## 2021-02-09 MED ORDER — HEPARIN (PORCINE) 25000 UT/250ML-% IV SOLN: 800.0000 [IU]/h | INTRAVENOUS | Status: DC

## 2021-02-09 MED ORDER — NITROGLYCERIN 0.4 MG SL SUBL
SUBLINGUAL_TABLET | SUBLINGUAL | Status: AC
  Filled 2021-02-09: qty 1

## 2021-02-09 MED ORDER — NITROGLYCERIN 0.4 MG SL SUBL
0.4000 mg | SUBLINGUAL_TABLET | SUBLINGUAL | Status: DC | PRN
  Administered 2021-02-09 (×2): 0.4 mg via SUBLINGUAL

## 2021-02-09 NOTE — Discharge Summary (Signed)
Physician Discharge Summary  Candace [name] YNW:[id number] DOB: Mar 25, 1951 DOA: 02/08/2021  PCP: [name]., MD  Admit date: 02/08/2021 Discharge date: 02/09/2021    Admitted From: Home Disposition: Home  Recommendations for Outpatient Follow-up:  Follow up with PCP in 1-2 weeks, looks like she has an appointment to see her PCP tomorrow morning. Please obtain BMP/CBC in one week Please follow up with your PCP on the following pending results: Unresulted Labs (From admission, onward)     Start     Ordered   02/10/21 0500  APTT  Daily,   R      02/09/21 1006   02/10/21 0500  Heparin level (unfractionated)  Daily,   R      02/09/21 1006   02/09/21 1800  Heparin level (unfractionated)  Once-Timed,   TIMED        02/09/21 1006   02/09/21 1800  APTT  Once-Timed,   TIMED        02/09/21 1006   02/09/21 1019  Urinalysis, Routine w reflex microscopic Urine, Clean Catch  Once,   R        02/09/21 1018   02/09/21 0500  CBC  Daily,   R      02/08/21 2203              Home Health: None Equipment/Devices: None  Discharge Condition: Stable CODE STATUS: Full code Diet recommendation: Cardiac  Subjective: Seen and examined.  No chest pain currently but tells me that she has been dealing with chest pain on and off.  Following HPI and ED course is copied from my colleague admitting hospitalist Dr. Moise Boring H&P. HPI: Candace [name] is a 70 y.o. female with history of CAD status post CABG in 2017 last cardiac cath in June 2021 with history of diastolic dysfunction last EF measured in March 2022 was 39 to 65% with grade 1 diastolic dysfunction, diabetes mellitus, paroxysmal atrial fibrillation presents to the ER with complaint of chest pain.  Patient states he has been having chest pain off and on for the last 4 days.  Chest pain is mostly in the lower part of the sternum sometimes radiating to her left shoulder.  Happens even at rest sometimes on exertion lasts around 10  to 15 minutes resolved without any intervention.  The pain has been recurring more often patient is asked to come to the ER.  Patient also has been having some associated nausea denies any vomiting abdominal pain.  Denies any fever chills or productive cough.   ED Course: In the ER patient chest x-ray was unremarkable EKG shows sinus rhythm with LBBB and cardiac markers were negative.  ER physician discussed with on-call cardiologist will be seeing patient in consult.  Patient admitted for further work-up.  At the time of my exam patient was chest pain-free.  COVID test is pending.  Brief/Interim Summary: Briefly, patient was admitted under hospitalist service due to chest pain.  EKG did not show any acute ST-T wave changes.  All cardiac enzymes are normal.  History of CABG in 2017 with LIMA to LAD, SVG to PDA and SVG to OM1/OM2 Had PCI of OM graft in 2018 Pains sharp stabbing and can be at rest which is atypical Some relief with Zofran and Tylenol with nausea, Cath at [facility] more recently with occluded SVG to OM and atretic LIMA Medical Rx Had normal myovue 08/29/20 .Cardiology also saw her and opined that her chest pain is noncardiac.  There was  some question about malunion or nonunion of her sternotomy for which CT chest was done which did not reveal anything like that.  On my personal examination, patient did have a point tenderness on the chest indicative of musculoskeletal pain.  I tried to explain to the patient that this is likely muscle pain but patient is convinced that it is her heart pain.  She did not seem to be convinced despite of all the available data which is against any cardiac pain or angina.  She is from believe her that this is her angina and wants this treated.  I have informed her that cardiology has cleared her for discharge home and there is nothing further that medical team can do.  She will need to follow-up with her PCP and cardiologist.  She is being discharged home in stable  condition.  Discharge Diagnoses:  Principal Problem:   Chest pain Active Problems:   Type 2 diabetes mellitus with complication, without long-term current use of insulin (HCC)   AF (paroxysmal atrial fibrillation) (HCC)   B12 deficiency   Chronic diastolic CHF (congestive heart failure) (HCC)   Chronic daily headache    Discharge Instructions   Allergies as of 02/09/2021       Reactions   Lisinopril Cough   Penicillins Swelling, Rash, Other (See Comments)   Did it involve swelling of the face/tongue/throat, SOB, or low BP? Yes Did it involve sudden or severe rash/hives, skin peeling, or any reaction on the inside of your mouth or nose? Yes Did you need to seek medical attention at a hospital or doctor's office? Yes When did it last happen?      15 years If all above answers are "NO", may proceed with cephalosporin use.        Medication List     STOP taking these medications    doxycycline 100 MG tablet Commonly known as: VIBRA-TABS       TAKE these medications    acetaminophen 325 MG tablet Commonly known as: TYLENOL Take 650 mg by mouth every 6 (six) hours as needed for moderate pain or headache.   albuterol 108 (90 Base) MCG/ACT inhaler Commonly known as: VENTOLIN HFA Inhale 2 puffs into the lungs every 6 (six) hours as needed for wheezing or shortness of breath.   bismuth subsalicylate 193 MG chewable tablet Commonly known as: PEPTO BISMOL Chew 524 mg by mouth daily as needed for diarrhea or loose stools or indigestion.   clopidogrel 75 MG tablet Commonly known as: PLAVIX Take 1 tablet (75 mg total) by mouth daily.   Eliquis 5 MG Tabs tablet Generic drug: apixaban Take 5 mg by mouth 2 (two) times daily.   ezetimibe 10 MG tablet Commonly known as: ZETIA Take 1 tablet (10 mg total) by mouth daily.   furosemide 20 MG tablet Commonly known as: LASIX Take 20 mg by mouth.   glucose blood test strip Use to check blood sugars daily as instructed    Insulin Pen Needle 32G X 4 MM Misc Use to inject insulin daily   isosorbide mononitrate 120 MG 24 hr tablet Commonly known as: IMDUR TAKE 1 TABLET BY MOUTH EVERY DAY   Jardiance 25 MG Tabs tablet Generic drug: empagliflozin TAKE ONE TABLET o BEFORE BREAKFAST What changed: See the new instructions.   Lantus SoloStar 100 UNIT/ML Solostar Pen Generic drug: insulin glargine Inject 14 Units into the skin at bedtime.   loperamide 2 MG capsule Commonly known as: IMODIUM Take 2 mg by  mouth daily as needed for diarrhea or loose stools.   losartan 25 MG tablet Commonly known as: COZAAR TAKE ONE TABLET BY MOUTH EVERYDAY AT BEDTIME What changed: See the new instructions.   metoprolol tartrate 100 MG tablet Commonly known as: LOPRESSOR Take 100 mg by mouth 2 (two) times daily.   nitroGLYCERIN 0.4 MG SL tablet Commonly known as: NITROSTAT Place 1 tablet (0.4 mg total) under the tongue every 5 (five) minutes as needed for chest pain.   nystatin powder Commonly known as: nystatin Apply 1 application topically daily. What changed:  when to take this reasons to take this   ondansetron 8 MG disintegrating tablet Commonly known as: ZOFRAN-ODT Take 1 tablet (8 mg total) by mouth every 8 (eight) hours as needed. What changed: reasons to take this   OneTouch Delica Plus [id number] Misc USE UP TO 4 TIMES DAILY AS DIRECTED   pantoprazole 20 MG tablet Commonly known as: Protonix Take 1 tablet (20 mg total) by mouth 2 (two) times daily.   rosuvastatin 40 MG tablet Commonly known as: CRESTOR Take 1 tablet (40 mg total) by mouth daily.   sertraline 100 MG tablet Commonly known as: ZOLOFT Take 1 tablet (100 mg total) by mouth daily.   sertraline 50 MG tablet Commonly known as: ZOLOFT TAKE ONE TABLET BY MOUTH ONCE DAILY   Trulicity 1.5 FW/2.6VZ Sopn Generic drug: Dulaglutide Inject 1.5mg  into THE SKIN ONCE A WEEK What changed: See the new instructions.        Follow-up  Information     [name]., MD. Go on 02/10/2021.   Specialty: Family Medicine Why: @8 :30am Contact information: [address] [place] [address] [phone]                Allergies  Allergen Reactions   Lisinopril Cough   Penicillins Swelling, Rash and Other (See Comments)    Did it involve swelling of the face/tongue/throat, SOB, or low BP? Yes Did it involve sudden or severe rash/hives, skin peeling, or any reaction on the inside of your mouth or nose? Yes Did you need to seek medical attention at a hospital or doctor's office? Yes When did it last happen?      15 years If all above answers are "NO", may proceed with cephalosporin use.     Consultations: Cardiology   Procedures/Studies: DG Chest 2 View  Result Date: 02/08/2021 CLINICAL DATA:  Chest pain EXAM: CHEST - 2 VIEW COMPARISON:  Radiograph 12/04/2020 FINDINGS: Unchanged cardiomediastinal silhouette with prior median sternotomy and CABG. No focal airspace consolidation. No pleural effusion. No visible pneumothorax. No acute osseous abnormality. Severe left glenohumeral osteoarthritis. Milder glenohumeral arthritis on the right. IMPRESSION: No evidence of acute cardiopulmonary disease. Electronically Signed   By: Maurine Simmering M.D.   On: 02/08/2021 12:36   CT CHEST WO CONTRAST  Result Date: 02/09/2021 CLINICAL DATA:  Chest pain, status post CABG, evaluate for sternal nonunion EXAM: CT CHEST WITHOUT CONTRAST TECHNIQUE: Multidetector CT imaging of the chest was performed following the standard protocol without IV contrast. COMPARISON:  None. FINDINGS: Cardiovascular: Aortic atherosclerosis. Normal heart size. Three-vessel coronary artery calcifications status post median sternotomy and CABG. No pericardial effusion. Mediastinum/Nodes: No enlarged mediastinal, hilar, or axillary lymph nodes. Small hiatal hernia. Thyroid gland, trachea, and esophagus demonstrate no significant findings.  Lungs/Pleura: Lungs are clear. No pleural effusion or pneumothorax. Upper Abdomen: No acute abnormality. Musculoskeletal: No chest wall mass or suspicious bone lesions identified. Status post median sternotomy with  bony incorporation of the sternal halves. IMPRESSION: 1. Status post median sternotomy with bony incorporation of the sternal halves. 2. No acute abnormality of the lungs. 3. Coronary artery disease. Aortic Atherosclerosis (ICD10-I70.0). Electronically Signed   By: Delanna Ahmadi M.D.   On: 02/09/2021 10:48   US Abdomen Limited RUQ (LIVER/GB)  Result Date: 02/08/2021 CLINICAL DATA:  Nausea and chest pain. EXAM: ULTRASOUND ABDOMEN LIMITED RIGHT UPPER QUADRANT COMPARISON:  Ultrasound 01/18/2019.  CT 10/05/2020 FINDINGS: Gallbladder: Physiologically distended. No gallstones or wall thickening visualized. No sonographic Murphy sign noted by sonographer. Common bile duct: Diameter: 3 mm. Liver: No focal lesion identified. Minimally increased in parenchymal echogenicity. Portal vein is patent on color Doppler imaging with normal direction of blood flow towards the liver. Other: No right upper quadrant ascites. IMPRESSION: 1. Mild hepatic steatosis. 2. Normal sonographic appearance of the gallbladder and biliary tree. Electronically Signed   By: Keith Rake M.D.   On: 02/08/2021 21:57     Discharge Exam: Vitals:   02/09/21 0733 02/09/21 1059  BP: 137/77 (!) 148/78  Pulse: 73 74  Resp: 17 17  Temp: 98.1 F (36.7 C) 97.9 F (36.6 C)  SpO2: 100% 99%   Vitals:   02/09/21 0016 02/09/21 0428 02/09/21 0733 02/09/21 1059  BP: 121/79 (!) 151/90 137/77 (!) 148/78  Pulse:  78 73 74  Resp:  16 17 17   Temp:  98 F (36.7 C) 98.1 F (36.7 C) 97.9 F (36.6 C)  TempSrc:  Oral Oral Oral  SpO2:  97% 100% 99%  Weight: 74.6 kg     Height: 5\' 2"  (1.575 m)       General: Pt is alert, awake, not in acute distress Cardiovascular: RRR, S1/S2 +, no rubs, no gallops Respiratory: CTA bilaterally, no  wheezing, no rhonchi, point tenderness at the left lower chest, to the left side of the sternum. Abdominal: Soft, NT, ND, bowel sounds + Extremities: no edema, no cyanosis    The results of significant diagnostics from this hospitalization (including imaging, microbiology, ancillary and laboratory) are listed below for reference.     Microbiology: Recent Results (from the past 240 hour(s))  Resp Panel by RT-PCR (Flu A&B, Covid) Nasopharyngeal Swab     Status: None   Collection Time: 02/08/21 10:30 PM   Specimen: Nasopharyngeal Swab; Nasopharyngeal(NP) swabs in vial transport medium  Result Value Ref Range Status   SARS Coronavirus 2 by RT PCR NEGATIVE NEGATIVE Final    Comment: (NOTE) SARS-CoV-2 target nucleic acids are NOT DETECTED.  The SARS-CoV-2 RNA is generally detectable in upper respiratory specimens during the acute phase of infection. The lowest concentration of SARS-CoV-2 viral copies this assay can detect is 138 copies/mL. A negative result does not preclude SARS-Cov-2 infection and should not be used as the sole basis for treatment or other patient management decisions. A negative result may occur with  improper specimen collection/handling, submission of specimen other than nasopharyngeal swab, presence of viral mutation(s) within the areas targeted by this assay, and inadequate number of viral copies(<138 copies/mL). A negative result must be combined with clinical observations, patient history, and epidemiological information. The expected result is Negative.  Fact Sheet for Patients:  EntrepreneurPulse.com.au  Fact Sheet for Healthcare Providers:  IncredibleEmployment.be  This test is no t yet approved or cleared by the [place] FDA and  has been authorized for detection and/or diagnosis of SARS-CoV-2 by FDA under an Emergency Use Authorization (EUA). This EUA will remain  in effect (meaning this test can  be used) for  the duration of the COVID-19 declaration under Section 564(b)(1) of the Act, 21 U.S.C.section 360bbb-3(b)(1), unless the authorization is terminated  or revoked sooner.       Influenza A by PCR NEGATIVE NEGATIVE Final   Influenza B by PCR NEGATIVE NEGATIVE Final    Comment: (NOTE) The Xpert Xpress SARS-CoV-2/FLU/RSV plus assay is intended as an aid in the diagnosis of influenza from Nasopharyngeal swab specimens and should not be used as a sole basis for treatment. Nasal washings and aspirates are unacceptable for Xpert Xpress SARS-CoV-2/FLU/RSV testing.  Fact Sheet for Patients: EntrepreneurPulse.com.au  Fact Sheet for Healthcare Providers: IncredibleEmployment.be  This test is not yet approved or cleared by the [place] FDA and has been authorized for detection and/or diagnosis of SARS-CoV-2 by FDA under an Emergency Use Authorization (EUA). This EUA will remain in effect (meaning this test can be used) for the duration of the COVID-19 declaration under Section 564(b)(1) of the Act, 21 U.S.C. section 360bbb-3(b)(1), unless the authorization is terminated or revoked.  Performed at [facility], [place] [address] [place], [place] [address]      Labs: BNP (last 3 results) Recent Labs    10/04/20 2021 02/08/21 1145  BNP 87.0 712.4*   Basic Metabolic Panel: Recent Labs  Lab 02/08/21 1145 02/09/21 0631  NA 140 139  K 4.2 3.9  CL 109 108  CO2 25 25  GLUCOSE 120* 104*  BUN 7* 8  CREATININE 0.68 0.69  CALCIUM 8.7* 9.0   Liver Function Tests: Recent Labs  Lab 02/08/21 2109 02/09/21 0631  AST 18 16  ALT 13 11  ALKPHOS 80 76  BILITOT 0.5 0.4  PROT 6.2* 6.1*  ALBUMIN 3.6 3.4*   Recent Labs  Lab 02/08/21 2109  LIPASE 33   No results for input(s): AMMONIA in the last 168 hours. CBC: Recent Labs  Lab 02/08/21 1145 02/09/21 0631  WBC 6.7 6.0  HGB 13.0 12.1  HCT 40.8 38.7  MCV 101.0* 99.5  PLT 191 180    Cardiac Enzymes: No results for input(s): CKTOTAL, CKMB, CKMBINDEX, TROPONINI in the last 168 hours. BNP: Invalid input(s): POCBNP CBG: Recent Labs  Lab 02/08/21 2225 02/09/21 0530 02/09/21 1056  GLUCAP 108* 108* 92   D-Dimer No results for input(s): DDIMER in the last 72 hours. Hgb A1c No results for input(s): HGBA1C in the last 72 hours. Lipid Profile No results for input(s): CHOL, HDL, LDLCALC, TRIG, CHOLHDL, LDLDIRECT in the last 72 hours. Thyroid function studies No results for input(s): TSH, T4TOTAL, T3FREE, THYROIDAB in the last 72 hours.  Invalid input(s): FREET3 Anemia work up No results for input(s): VITAMINB12, FOLATE, FERRITIN, TIBC, IRON, RETICCTPCT in the last 72 hours. Urinalysis    Component Value Date/Time   COLORURINE YELLOW (A) 10/05/2020 0115   APPEARANCEUR CLEAR (A) 10/05/2020 0115   APPEARANCEUR Clear 12/24/2014 0905   LABSPEC 1.036 (H) 10/05/2020 0115   LABSPEC 1.030 09/14/2013 2123   PHURINE 6.0 10/05/2020 0115   GLUCOSEU >=500 (A) 10/05/2020 0115   GLUCOSEU Negative 09/14/2013 2123   HGBUR SMALL (A) 10/05/2020 0115   BILIRUBINUR NEGATIVE 10/05/2020 0115   BILIRUBINUR Negative 02/06/2020 1014   BILIRUBINUR Negative 12/24/2014 0905   BILIRUBINUR Negative 09/14/2013 2123   KETONESUR 5 (A) 10/05/2020 0115   PROTEINUR NEGATIVE 10/05/2020 0115   UROBILINOGEN 0.2 02/06/2020 1014   NITRITE NEGATIVE 10/05/2020 0115   LEUKOCYTESUR NEGATIVE 10/05/2020 0115   LEUKOCYTESUR 3+ 09/14/2013 2123   Sepsis Labs Invalid input(s):  PROCALCITONIN,  WBC,  LACTICIDVEN Microbiology Recent Results (from the past 240 hour(s))  Resp Panel by RT-PCR (Flu A&B, Covid) Nasopharyngeal Swab     Status: None   Collection Time: 02/08/21 10:30 PM   Specimen: Nasopharyngeal Swab; Nasopharyngeal(NP) swabs in vial transport medium  Result Value Ref Range Status   SARS Coronavirus 2 by RT PCR NEGATIVE NEGATIVE Final    Comment: (NOTE) SARS-CoV-2 target nucleic acids are  NOT DETECTED.  The SARS-CoV-2 RNA is generally detectable in upper respiratory specimens during the acute phase of infection. The lowest concentration of SARS-CoV-2 viral copies this assay can detect is 138 copies/mL. A negative result does not preclude SARS-Cov-2 infection and should not be used as the sole basis for treatment or other patient management decisions. A negative result may occur with  improper specimen collection/handling, submission of specimen other than nasopharyngeal swab, presence of viral mutation(s) within the areas targeted by this assay, and inadequate number of viral copies(<138 copies/mL). A negative result must be combined with clinical observations, patient history, and epidemiological information. The expected result is Negative.  Fact Sheet for Patients:  EntrepreneurPulse.com.au  Fact Sheet for Healthcare Providers:  IncredibleEmployment.be  This test is no t yet approved or cleared by the [place] FDA and  has been authorized for detection and/or diagnosis of SARS-CoV-2 by FDA under an Emergency Use Authorization (EUA). This EUA will remain  in effect (meaning this test can be used) for the duration of the COVID-19 declaration under Section 564(b)(1) of the Act, 21 U.S.C.section 360bbb-3(b)(1), unless the authorization is terminated  or revoked sooner.       Influenza A by PCR NEGATIVE NEGATIVE Final   Influenza B by PCR NEGATIVE NEGATIVE Final    Comment: (NOTE) The Xpert Xpress SARS-CoV-2/FLU/RSV plus assay is intended as an aid in the diagnosis of influenza from Nasopharyngeal swab specimens and should not be used as a sole basis for treatment. Nasal washings and aspirates are unacceptable for Xpert Xpress SARS-CoV-2/FLU/RSV testing.  Fact Sheet for Patients: EntrepreneurPulse.com.au  Fact Sheet for Healthcare Providers: IncredibleEmployment.be  This test is not  yet approved or cleared by the [place] FDA and has been authorized for detection and/or diagnosis of SARS-CoV-2 by FDA under an Emergency Use Authorization (EUA). This EUA will remain in effect (meaning this test can be used) for the duration of the COVID-19 declaration under Section 564(b)(1) of the Act, 21 U.S.C. section 360bbb-3(b)(1), unless the authorization is terminated or revoked.  Performed at [facility], [place] [address], [place],  [address]      Time coordinating discharge: Over 30 minutes  SIGNED:   Darliss Cheney, MD  [facility] 02/09/2021, 12:22 PM  If 7PM-7AM, please contact night-coverage [email or web address]

## 2021-02-09 NOTE — Progress Notes (Signed)
[place] for heparin Indication: chest pain/ACS, history afib  Allergies  Allergen Reactions   Lisinopril Cough   Penicillins Swelling, Rash and Other (See Comments)    Did it involve swelling of the face/tongue/throat, SOB, or low BP? Yes Did it involve sudden or severe rash/hives, skin peeling, or any reaction on the inside of your mouth or nose? Yes Did you need to seek medical attention at a hospital or doctor's office? Yes When did it last happen?      15 years If all above answers are "NO", may proceed with cephalosporin use.     Patient Measurements: Height: 5\' 2"  (157.5 cm) Weight: 74.6 kg (164 lb 8 oz) IBW/kg (Calculated) : 50.1 Heparin Dosing Weight: 68kg  Vital Signs: Temp: 98.1 F (36.7 C) (11/14 0733) Temp Source: Oral (11/14 0733) BP: 137/77 (11/14 0733) Pulse Rate: 73 (11/14 0733)  Labs: Recent Labs    02/08/21 1145 02/08/21 1402 02/09/21 0631  HGB 13.0  --  12.1  HCT 40.8  --  38.7  PLT 191  --  180  APTT  --   --  106*  HEPARINUNFRC  --   --  0.75*  CREATININE 0.68  --  0.69  TROPONINIHS 6 6  --      Estimated Creatinine Clearance: 62.8 mL/min (by C-G formula based on SCr of 0.69 mg/dL).   Medical History: Past Medical History:  Diagnosis Date   Allergy    Anemia    Anxiety    Arrhythmia    Arthritis    Atrial fibrillation ([place])    Coronary artery disease    Depression    Diabetes mellitus without complication ([place])    Dyspnea    doe   Dysrhythmia    GERD (gastroesophageal reflux disease)    Headache    History of hiatal hernia    Hyperlipidemia    Hypertension    Myocardial infarction ([place])    2016, 04/2017   Myocardial infarction with cardiac rehabilitation [facility])    MI 2016/ CABG 8/17    FINISHED CARDIAC REHAB 3 WEEKS AGO   Panic attack    Pneumonia    Reflux    Stroke ([place]) 2015   showed up on MRI; no weakness noted   TIA (transient ischemic attack)    Voice tremor      Assessment: 70 year old female with history of PAF on apixaban prior to admit (last dose this am) presents to [facility] with chest pain. New orders to transition patient to IV heparin. CBC appears within normal limits.   Will monitor aptt's given recent apixaban use until they correlate with heparin levels.   APTT this AM above goal at 106.  No overt bleeding or complications per discussion with patient.  Goal of Therapy:  Heparin level 0.3-0.7 units/ml aPTT 66-102 seconds Monitor platelets by anticoagulation protocol: Yes   Plan:  Decrease IV heparin to 800 units/hr. Check anti-Xa level and aptt in 8 hours and daily while on heparin Hopefully can switch back to po Eliquis today? Continue to monitor H&H and platelets  [place] Crane, Roylene Reason, BCCP Clinical Pharmacist  02/09/2021 10:05 AM   [facility] pharmacy phone numbers are listed on [email or web address]

## 2021-02-09 NOTE — Progress Notes (Signed)
Mobility Specialist Progress Note:   02/09/21 0951  Mobility  Activity Ambulated in hall  Level of Assistance Standby assist, set-up cues, supervision of patient - no hands on  Assistive Device None (IV pole)  Distance Ambulated (ft) 170 ft  Mobility Ambulated with assistance in hallway  Mobility Response Tolerated well  Mobility performed by Mobility specialist  Bed Position Chair  $Mobility charge 1 Mobility   Pt received in bed willing to participate in mobility. No complaints of pain and asymptomatic. Pt returned to chair with call bell in reach and all needs met.   [facility] Health and safety inspector Phone [phone]

## 2021-02-09 NOTE — Progress Notes (Signed)
Discharge instructions (including medications) discussed with and copy provided to patient/caregiver 

## 2021-02-09 NOTE — Progress Notes (Signed)
Subjective:  More chest pain appears non cardiac   Objective:  Vitals:   02/08/21 2324 02/09/21 0016 02/09/21 0428 02/09/21 0733  BP: (!) 155/91 121/79 (!) 151/90 137/77  Pulse:   78 73  Resp:   16 17  Temp: 98.4 F (36.9 C)  98 F (36.7 C) 98.1 F (36.7 C)  TempSrc: Oral  Oral Oral  SpO2: 100%  97% 100%  Weight:  74.6 kg    Height:  5\' 2"  (1.575 m)      Intake/Output from previous day:  Intake/Output Summary (Last 24 hours) at 02/09/2021 0944 Last data filed at 02/09/2021 0500 Gross per 24 hour  Intake 298.01 ml  Output 500 ml  Net -201.99 ml    Physical Exam: Affect appropriate Healthy:  appears stated age HEENT: normal Neck supple with no adenopathy JVP normal no bruits no thyromegaly Lungs clear with no wheezing and good diaphragmatic motion Heart:  S1/S2 no murmur, no rub, gallop or click PMI normal post sternotomy  Abdomen: benighn, BS positve, no tenderness, no AAA no bruit.  No HSM or HJR Distal pulses intact with no bruits No edema Neuro non-focal Skin warm and dry No muscular weakness   Lab Results: Basic Metabolic Panel: Recent Labs    02/08/21 1145 02/09/21 0631  NA 140 139  K 4.2 3.9  CL 109 108  CO2 25 25  GLUCOSE 120* 104*  BUN 7* 8  CREATININE 0.68 0.69  CALCIUM 8.7* 9.0   Liver Function Tests: Recent Labs    02/08/21 2109 02/09/21 0631  AST 18 16  ALT 13 11  ALKPHOS 80 76  BILITOT 0.5 0.4  PROT 6.2* 6.1*  ALBUMIN 3.6 3.4*   Recent Labs    02/08/21 2109  LIPASE 33   CBC: Recent Labs    02/08/21 1145 02/09/21 0631  WBC 6.7 6.0  HGB 13.0 12.1  HCT 40.8 38.7  MCV 101.0* 99.5  PLT 191 180     Imaging: DG Chest 2 View  Result Date: 02/08/2021 CLINICAL DATA:  Chest pain EXAM: CHEST - 2 VIEW COMPARISON:  Radiograph 12/04/2020 FINDINGS: Unchanged cardiomediastinal silhouette with prior median sternotomy and CABG. No focal airspace consolidation. No pleural effusion. No visible pneumothorax. No acute osseous  abnormality. Severe left glenohumeral osteoarthritis. Milder glenohumeral arthritis on the right. IMPRESSION: No evidence of acute cardiopulmonary disease. Electronically Signed   By: Maurine Simmering M.D.   On: 02/08/2021 12:36   US Abdomen Limited RUQ (LIVER/GB)  Result Date: 02/08/2021 CLINICAL DATA:  Nausea and chest pain. EXAM: ULTRASOUND ABDOMEN LIMITED RIGHT UPPER QUADRANT COMPARISON:  Ultrasound 01/18/2019.  CT 10/05/2020 FINDINGS: Gallbladder: Physiologically distended. No gallstones or wall thickening visualized. No sonographic Murphy sign noted by sonographer. Common bile duct: Diameter: 3 mm. Liver: No focal lesion identified. Minimally increased in parenchymal echogenicity. Portal vein is patent on color Doppler imaging with normal direction of blood flow towards the liver. Other: No right upper quadrant ascites. IMPRESSION: 1. Mild hepatic steatosis. 2. Normal sonographic appearance of the gallbladder and biliary tree. Electronically Signed   By: Keith Rake M.D.   On: 02/08/2021 21:57    Cardiac Studies:  ECG: SR LBBB old    Telemetry:  NSR 02/09/2021   Echo:   Medications:    insulin aspart  0-9 Units Subcutaneous TID WC      heparin      Assessment/Plan:   Chest Pain: very atypical negative troponin despite long episodes CABG in 2017 with LIMA to  LAD, SVG to PDA and SVG to OM1/OM2 Had PCI of OM graft in 2018 Pains sharp stabbing and can be at rest which is atypical Some relief with Zofran and Tylenol with nausea Cath at [facility] more recently with occluded SVG to OM and atretic LIMA Medical Rx Had normal myovue 08/29/20 Plan on non contrast CT chest as 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

## 2021-02-09 NOTE — Progress Notes (Signed)
APPOINTMENT REMINDER   Called Candace [name], No answer, left message of appointment on 02/10/2021 at 8:30 am via telephone visit with Junius Argyle , Pharm D. Notified to have all medications, supplements, blood pressure and/or blood sugar logs available during appointment and to return call if need to reschedule.  [place] Pharmacist Assistant [phone]

## 2021-02-10 ENCOUNTER — Ambulatory Visit: Payer: Self-pay

## 2021-02-10 ENCOUNTER — Telehealth: Payer: Self-pay | Admitting: Family Medicine

## 2021-02-10 ENCOUNTER — Telehealth: Payer: Medicare Other

## 2021-02-10 DIAGNOSIS — I5032 Chronic diastolic (congestive) heart failure: Secondary | ICD-10-CM

## 2021-02-10 DIAGNOSIS — E118 Type 2 diabetes mellitus with unspecified complications: Secondary | ICD-10-CM

## 2021-02-10 DIAGNOSIS — I10 Essential (primary) hypertension: Secondary | ICD-10-CM

## 2021-02-10 DIAGNOSIS — I480 Paroxysmal atrial fibrillation: Secondary | ICD-10-CM

## 2021-02-10 NOTE — Telephone Encounter (Signed)
Transition Care Management Follow-up Telephone Call Date of discharge and from where: 02/09/21 [place] How have you been since you were released from the hospital? OK, pain hasn't went away, but not as bad now Any questions or concerns? No  Items Reviewed: Did the pt receive and understand the discharge instructions provided? Yes  Medications obtained and verified? Yes  Other? No  Any new allergies since your discharge? No  Dietary orders reviewed? No Do you have support at home? Yes   Home Care and Equipment/Supplies: Were home health services ordered? no  Functional Questionnaire: (I = Independent and D = Dependent) ADLs: 1  Bathing/Dressing- 1  Meal Prep- 1  Eating- 1  Maintaining continence- 1  Transferring/Ambulation- 1  Managing Meds- 1  Follow up appointments reviewed:  PCP Hospital f/u appt confirmed? No  Are transportation arrangements needed? No  If their condition worsens, is the pt aware to call PCP or go to the Emergency Dept.? Yes Was the patient provided with contact information for the PCP's office or ED? Yes Was to pt encouraged to call back with questions or concerns? Yes

## 2021-02-10 NOTE — Chronic Care Management (AMB) (Signed)
Chronic Care Management   CCM RN Visit Note  02/10/2021 Name: Candace [name] MRN: [id number] DOB: [birth year]  Subjective: Candace [name] is a 70 y.o. year old female who is a primary care patient of [name]., MD. The care management team was consulted for assistance with disease management and care coordination needs.    Engaged with patient by telephone for follow up visit in response to provider referral for case management and care coordination services.   Consent to Services:  The patient was given information about Chronic Care Management services, agreed to services, and gave verbal consent prior to initiation of services.  Please see initial visit note for detailed documentation.    Assessment: Review of patient past medical history, allergies, medications, health status, including review of consultants reports, laboratory and other test data, was performed as part of comprehensive evaluation and provision of chronic care management services.   SDOH (Social Determinants of Health) assessments and interventions performed:  No  CCM Care Plan  Allergies  Allergen Reactions   Lisinopril Cough   Penicillins Swelling, Rash and Other (See Comments)    Did it involve swelling of the face/tongue/throat, SOB, or low BP? Yes Did it involve sudden or severe rash/hives, skin peeling, or any reaction on the inside of your mouth or nose? Yes Did you need to seek medical attention at a hospital or doctor's office? Yes When did it last happen?      15 years If all above answers are "NO", may proceed with cephalosporin use.     Outpatient Encounter Medications as of 02/10/2021  Medication Sig Note   acetaminophen (TYLENOL) 325 MG tablet Take 650 mg by mouth every 6 (six) hours as needed for moderate pain or headache.     albuterol (VENTOLIN HFA) 108 (90 Base) MCG/ACT inhaler Inhale 2 puffs into the lungs every 6 (six) hours as needed for wheezing or shortness of breath.     bismuth subsalicylate (PEPTO BISMOL) 262 MG chewable tablet Chew 524 mg by mouth daily as needed for diarrhea or loose stools or indigestion.    clopidogrel (PLAVIX) 75 MG tablet Take 1 tablet (75 mg total) by mouth daily.    ELIQUIS 5 MG TABS tablet Take 5 mg by mouth 2 (two) times daily. 06/06/2020: Prescribed by Dr. Nehemiah Massed   ezetimibe (ZETIA) 10 MG tablet Take 1 tablet (10 mg total) by mouth daily.    furosemide (LASIX) 20 MG tablet Take 20 mg by mouth.    glucose blood test strip Use to check blood sugars daily as instructed    insulin glargine (LANTUS SOLOSTAR) 100 UNIT/ML Solostar Pen Inject 14 Units into the skin at bedtime.    Insulin Pen Needle 32G X 4 MM MISC Use to inject insulin daily    isosorbide mononitrate (IMDUR) 120 MG 24 hr tablet TAKE 1 TABLET BY MOUTH EVERY DAY (Patient taking differently: Take 120 mg by mouth daily.)    JARDIANCE 25 MG TABS tablet TAKE ONE TABLET o BEFORE BREAKFAST (Patient taking differently: Take 25 mg by mouth daily.)    Lancets (ONETOUCH DELICA PLUS [id number]) MISC USE UP TO 4 TIMES DAILY AS DIRECTED    loperamide (IMODIUM) 2 MG capsule Take 2 mg by mouth daily as needed for diarrhea or loose stools.    losartan (COZAAR) 25 MG tablet TAKE ONE TABLET BY MOUTH EVERYDAY AT BEDTIME (Patient taking differently: Take 25 mg by mouth at bedtime.)    metoprolol tartrate (LOPRESSOR) 100 MG tablet  Take 100 mg by mouth 2 (two) times daily. 06/06/2020: Prescribed by Dr. Nehemiah Massed    nitroGLYCERIN (NITROSTAT) 0.4 MG SL tablet Place 1 tablet (0.4 mg total) under the tongue every 5 (five) minutes as needed for chest pain.    nystatin (NYSTATIN) powder Apply 1 application topically daily. (Patient taking differently: Apply 1 application topically daily as needed.)    ondansetron (ZOFRAN-ODT) 8 MG disintegrating tablet Take 1 tablet (8 mg total) by mouth every 8 (eight) hours as needed. (Patient taking differently: Take 8 mg by mouth every 8 (eight) hours as needed for  refractory nausea / vomiting.)    pantoprazole (PROTONIX) 20 MG tablet Take 1 tablet (20 mg total) by mouth 2 (two) times daily.    rosuvastatin (CRESTOR) 40 MG tablet Take 1 tablet (40 mg total) by mouth daily.    sertraline (ZOLOFT) 100 MG tablet Take 1 tablet (100 mg total) by mouth daily.    sertraline (ZOLOFT) 50 MG tablet TAKE ONE TABLET BY MOUTH ONCE DAILY (Patient not taking: Reported on 57/84/6962)    TRULICITY 1.5 XB/2.8UX SOPN Inject 1.17m into THE SKIN ONCE A WEEK (Patient taking differently: Inject 1.5 mg as directed once a week.)    [DISCONTINUED] cetirizine (ZYRTEC) 5 MG tablet Take 2 tablets (10 mg total) by mouth daily.    No facility-administered encounter medications on file as of 02/10/2021.    Patient Active Problem List   Diagnosis Date Noted   Vertigo    Unsteady gait    Acute bronchitis 03/07/2019   Esophageal dysphagia    Stomach irritation    Gastric polyp    Esophageal lump    Hx of colonic polyp    Polyp of colon    Diverticulosis of large intestine without diverticulitis    PSVT (paroxysmal supraventricular tachycardia) ([place] 10/03/2018   Abnormal ECG 07/05/2018   Tachycardia 03/27/2018   Pain in limb 02/28/2018   Chronic diastolic CHF (congestive heart failure) ([place] 11/03/2017   TIA (transient ischemic attack) 11/03/2017   COPD suggested by initial evaluation ([place] 08/23/2017   Acute and chronic respiratory failure with hypoxia ([facility] 08/23/2017   Postoperative state 08/15/2017   Incidental pulmonary nodule 08/01/2017   Non-ST elevation myocardial infarction (NSTEMI), subendocardial infarction, subsequent episode of care ([place] 06/01/2017   B12 deficiency 12/01/2016   Vitamin D deficiency 11/04/2016   Depression with anxiety 09/30/2016   AF (paroxysmal atrial fibrillation) ([place] 09/30/2016   Resting tremor 09/30/2016   Mild obstructive sleep apnea 09/30/2016   Headache 08/18/2016   Unstable angina ([place] 08/03/2016   Type 2 diabetes mellitus with  complication, without long-term current use of insulin ([place] 08/03/2016   Chronic daily headache 05/05/2016   Asthma 11/11/2015   S/P CABG x 4 11/10/2015   Coronary artery disease 11/07/2015   Carotid artery narrowing 02/08/2014   Chest pain 02/08/2014   Mixed hyperlipidemia 02/08/2014   Bilateral carotid artery stenosis 02/08/2014   Atypical chest pain 02/07/2014   Essential hypertension 02/07/2014   Awareness of heartbeats 02/07/2014   D (diarrhea) 10/05/2013   Gastroesophageal reflux disease 10/05/2013     Patient Care Plan: RN Care Management Plan of Care     Problem Identified: DM, HF, HTN, A-Fib and DM      Long-Range Goal: Disease Progression Prevented or Minimized   Start Date: 02/10/2021  Expected End Date: 05/11/2021  Priority: High  Note:   Current Barriers:  Chronic Disease Management support and education needs related to Atrial Fibrillation, CHF, HTN, and DMII  RNCM Clinical Goal(s):  Patient will continue to work with RN Care Manager to address care management and care coordination needs related to  Atrial Fibrillation, CHF, HTN, and DMII through collaboration with RN Care manager, provider, and care team.   Interventions: 1:1 collaboration with primary care provider regarding development and update of comprehensive plan of care as evidenced by provider attestation and co-signature Inter-disciplinary care team collaboration (see longitudinal plan of care) Evaluation of current treatment plan related to  self management and patient's adherence to plan as established by provider  Hypertension Interventions: Last practice recorded BP readings:  BP Readings from Last 3 Encounters:  02/09/21 (!) 148/78  12/25/20 114/72  12/04/20 132/66  Most recent eGFR/CrCl:  Lab Results  Component Value Date   EGFR 67 12/02/2020    No components found for: CRCL  Reviewed medications. Reports taking as prescribed and tolerating current regimen. Reviewed medications that  require new orders and refills including nitroglycerin. Will follow up with the [facility] Pharm[facility] team regarding medications needed via [facility]. Reviewed established blood pressure parameters along with indications for notifying a provider. Encouraged to monitor and record readings.  Discussed compliance with recommended cardiac prudent diet. Encouraged to continue reading nutrition labels, monitoring sodium intake and avoid highly processed foods when possible. Discussed complications of uncontrolled blood pressure.  Discussed symptoms since recent visit to the Emergency Room. She was recently evaluated for d/t complaints of chest discomfort. Reports symptoms have resolved and feeling well at time of call. She is aware of need to follow up with the Cardiology team.  Reviewed s/sx of heart attack, stroke and worsening symptoms that require immediate medical attention.    Diabetes Interventions: Assessed patient's understanding of A1c goal: <7%  Lab Results  Component Value Date   HGBA1C 10.7 (H) 12/02/2020  Reviewed medications. Reports taking orals medications and insulin as prescribed. She will require additional Trulicity. Reports only having one pen left. Will follow up with the [facility] team regarding availability via [facility]. Encouraged to continue taking as prescribed and update the care management team regarding concerns related to prescription cost. Reviewed blood sugar readings and importance of consistent blood glucose monitoring. Reports recent fasting readings have ranged in the 90's. Encouraged to continue monitoring and maintain a log. Reviewed s/sx of hypoglycemia and hyperglycemia along with recommended interventions. Discussed nutritional intake. Reports attempting to adhere to the recommended diet. Advised to continue monitoring carbohydrate intake and avoid concentrated sugars when possible.   Heart Failure Interventions: Wt Readings from Last 3  Encounters:  02/09/21 164 lb 8 oz (74.6 kg)  12/25/20 164 lb (74.4 kg)  10/04/20 167 lb (75.8 kg)  Reviewed current plan for CHF management. Encouraged to continue taking medications as prescribed.  Reviewed recommended weight parameters. Encouraged to weigh at least once a week if unable to weigh daily and record readings. Reviewed need to notify provider for weight gain greater than 3 lbs overnight or weight gain greater than 5 lbs within a week.  Advised to continue monitoring sodium intake.  Reviewed s/sx of fluid overload and indications for notifying a provider. She was recently evaluated in the Emergency Department r/t complaints of chest pain. Denies episodes of chest discomfort, palpitations or  shortness of breath at time of call. Denies increased abdominal or lower extremity edema. Denies changes in activity tolerance since ED visit.  Reviewed worsening s/sx related to CHF exacerbation and indications for seeking immediate medical attention.   AFIB Interventions:  Reviewed increased risk of stroke  due to Afib. Advised to continue taking medications as prescribed Reviewed bleeding risk and importance of self-monitoring for signs/symptoms of bleeding Reviewed importance completing Cardiology visits as scheduled and completing laboratory monitoring as prescribed Reviewed s/sx that require immediate medical attention.     Patient Goals/Self-Care Activities: Patient will self administer medications as prescribed Patient will attend all scheduled provider appointments Patient will call pharmacy for medication refills Patient will continue to perform ADL's independently Patient will continue to perform IADL's independently Patient will call provider office for new concerns or questions   Follow Up Plan:   Will follow up within the next month       PLAN A member of the care management team will follow up within the next month.   [name] Care  Management [facility] [phone]

## 2021-02-10 NOTE — Telephone Encounter (Signed)
Patient was calling to get a hospital follow-up appointment. Dr Rosanna Randy didn't have anything available until May 04, 2021. Patient stated she will just keep her appointment for 05/06/21. Just needing to know if patient needs to be seen before this.

## 2021-02-11 ENCOUNTER — Telehealth: Payer: Self-pay

## 2021-02-11 ENCOUNTER — Ambulatory Visit: Payer: Self-pay

## 2021-02-11 DIAGNOSIS — E118 Type 2 diabetes mellitus with unspecified complications: Secondary | ICD-10-CM

## 2021-02-11 NOTE — Progress Notes (Signed)
Chronic Care Management Pharmacy Assistant   Name: Candace [name]  MRN: [id number] DOB: 1950/06/28  Reason for Encounter: Medication Review/Medication Coordination Call.   Recent office visits:  02/10/2021 Neldon Labella RN (CCM) No Medication changes noted 02/05/2021 Neldon Labella RN (CCM) No Medication changes noted 01/30/2021 Felecia McCray RN (CCM) No Medication changes noted  Recent consult visits:  No recent [facility] visits:  Medication Reconciliation was completed by comparing discharge summary, patient's EMR and Pharmacy list, and upon discussion with patient.  Admitted to the hospital on 02/08/2021 due to Chest Pain. Discharge date was 02/09/2021. Discharged from [facility]?Medications Started at [facility] Discharge:?? -started None  Medication Changes at Hospital Discharge: -Changed None  Medications Discontinued at Hospital Discharge: -Stopped Doxycycline  Medications that remain the same after Hospital Discharge:??  -All other medications will remain the same.    Medications: Outpatient Encounter Medications as of 02/11/2021  Medication Sig Note   acetaminophen (TYLENOL) 325 MG tablet Take 650 mg by mouth every 6 (six) hours as needed for moderate pain or headache.     albuterol (VENTOLIN HFA) 108 (90 Base) MCG/ACT inhaler Inhale 2 puffs into the lungs every 6 (six) hours as needed for wheezing or shortness of breath.    bismuth subsalicylate (PEPTO BISMOL) 262 MG chewable tablet Chew 524 mg by mouth daily as needed for diarrhea or loose stools or indigestion.    clopidogrel (PLAVIX) 75 MG tablet Take 1 tablet (75 mg total) by mouth daily.    ELIQUIS 5 MG TABS tablet Take 5 mg by mouth 2 (two) times daily. 06/06/2020: Prescribed by Dr. Nehemiah Massed   ezetimibe (ZETIA) 10 MG tablet Take 1 tablet (10 mg total) by mouth daily.    furosemide (LASIX) 20 MG tablet Take 20 mg by mouth.    glucose blood test strip Use to check blood sugars  daily as instructed    insulin glargine (LANTUS SOLOSTAR) 100 UNIT/ML Solostar Pen Inject 14 Units into the skin at bedtime.    Insulin Pen Needle 32G X 4 MM MISC Use to inject insulin daily    isosorbide mononitrate (IMDUR) 120 MG 24 hr tablet TAKE 1 TABLET BY MOUTH EVERY DAY (Patient taking differently: Take 120 mg by mouth daily.)    JARDIANCE 25 MG TABS tablet TAKE ONE TABLET o BEFORE BREAKFAST (Patient taking differently: Take 25 mg by mouth daily.)    Lancets (ONETOUCH DELICA PLUS [id number]) MISC USE UP TO 4 TIMES DAILY AS DIRECTED    loperamide (IMODIUM) 2 MG capsule Take 2 mg by mouth daily as needed for diarrhea or loose stools.    losartan (COZAAR) 25 MG tablet TAKE ONE TABLET BY MOUTH EVERYDAY AT BEDTIME (Patient taking differently: Take 25 mg by mouth at bedtime.)    metoprolol tartrate (LOPRESSOR) 100 MG tablet Take 100 mg by mouth 2 (two) times daily. 06/06/2020: Prescribed by Dr. Nehemiah Massed    nitroGLYCERIN (NITROSTAT) 0.4 MG SL tablet Place 1 tablet (0.4 mg total) under the tongue every 5 (five) minutes as needed for chest pain.    nystatin (NYSTATIN) powder Apply 1 application topically daily. (Patient taking differently: Apply 1 application topically daily as needed.)    ondansetron (ZOFRAN-ODT) 8 MG disintegrating tablet Take 1 tablet (8 mg total) by mouth every 8 (eight) hours as needed. (Patient taking differently: Take 8 mg by mouth every 8 (eight) hours as needed for refractory nausea / vomiting.)    pantoprazole (PROTONIX) 20 MG tablet  Take 1 tablet (20 mg total) by mouth 2 (two) times daily.    rosuvastatin (CRESTOR) 40 MG tablet Take 1 tablet (40 mg total) by mouth daily.    sertraline (ZOLOFT) 100 MG tablet Take 1 tablet (100 mg total) by mouth daily.    sertraline (ZOLOFT) 50 MG tablet TAKE ONE TABLET BY MOUTH ONCE DAILY (Patient not taking: Reported on 00/34/9179)    TRULICITY 1.5 XT/0.5WP SOPN Inject 1.5mg  into THE SKIN ONCE A WEEK (Patient taking differently: Inject 1.5  mg as directed once a week.)    [DISCONTINUED] cetirizine (ZYRTEC) 5 MG tablet Take 2 tablets (10 mg total) by mouth daily.    No facility-administered encounter medications on file as of 02/11/2021.   Care Gaps: COVID 19 Vaccine (4- Booster) Ophthalmology Exam (Last Completed 06/28/2019) Tetanus Vaccine (Last Completed 07/28/2010) Shingrix Vaccine (2 of 2) (Last Completed 06/14/2020) Foot Exam (Last Completed 10/27/2019) A1C was 10.7% on 12/02/20. [facility]: 11/18/2020 Rosuvastatin 40 mg last filled for a 90- Day supply at [facility] 11/18/2020 Losartan 25 mg last filled for a 90-Day supply at [name] 11/18/2020 Jardiance 25 mg last filled for a 90-Day Supply at [facility] 79/48/0165 Trulicity 1.5 mg last filled for a 84-Day supply at [place] [facility] Medication Fill Gaps: None ID  Reviewed chart for medication changes ahead of medication coordination call.  BP Readings from Last 3 Encounters:  02/09/21 (!) 148/78  12/25/20 114/72  12/04/20 132/66    Lab Results  Component Value Date   HGBA1C 10.7 (H) 12/02/2020     Patient obtains medications through Adherence Packaging  90 Days   Last adherence delivery included:  Isosorbide mononitrate 30 mg one tablet daily - Breakfast Pantoprazole 20 mg one tablet two times a day- breakfast, Evening meals Losartan 25 mg one tablet daily- Bedtime Eliquis 5 mg 1 tablet two times daily breakfast, evening meals Jardiance 25 mg 1 tablet daily before breakfast Rosuvastatin 40 mg 1 tablet daily - Breakfast Sertraline 50 mg 1 tablet daily breakfast  Trulicity 1.5 VV/7.4 ML Inject 1.5 mg into the skin once a week. (Thursday) Metoprolol tartrate 100 mg 1 tablet 2 times daily Breakfast and Evening Meals  Patient declined medications last month  ezetimibe 10 mg 1 tablet daily breakfast - (receive 90 day supply from [facility] on 09/22/2020) Clopidogrel 75 mg 1 tablet daily breakfast (receive 90 day supply from  [facility] on 09/22/2020)  Patient is due for next adherence delivery on: 02/23/2021. Called patient and reviewed medications and coordinated delivery.  This delivery to include: Pantoprazole 20 mg one tablet two times a day- breakfast, Evening meals Losartan 25 mg one tablet daily- Bedtime Eliquis 5 mg 1 tablet two times daily breakfast, evening meals Jardiance 25 mg 1 tablet daily before breakfast Rosuvastatin 40 mg 1 tablet daily - Breakfast Sertraline 100 mg 1 tablet daily breakfast  Metoprolol tartrate 100 mg 1 tablet 2 times daily Breakfast and Evening Meals Nitroglycerin 0.4 mg PRN  Coordinated acute fill for Trulicity to be delivered 02/12/2021.  Patient declined the following medications (meds) due to (reason) Isosorbide mononitrate 120 mg daily breakfast - (receive 90 day supply from [facility] on 01/31/2021) ezetimibe 10 mg 1 tablet daily breakfast - (receive 90 day supply from [facility] on 12/23/2020). Clopidogrel 75 mg 1 tablet daily breakfast (receive 90 day supply from [facility] on 12/23/2020). Insulin Glargine 100 unit/ML Solostar Pen- Inject 14 units into the skin at bedtime (Adequate supply, 86 day supply given on  01/13/2021) Onetouch Ultra Test Strip (Adequate supply, 50 day supply given on 01/31/2021) Ondansetron 8 mg PRN (Adequate supply, 7 day supply given on 01/05/2021) Furosemide 20 mg 1 tablet daily -Breakfast, (Adequate supply 90 day supply given on 12/01/2020 from [facility]).  I ask the patient if she would like to transfer all her medications to [facility].Patient agree to transferring all her medication to [facility], and states that would be very helpful for her.  [facility] on [facility]/11/2021 to request a [facility] will [facility] the [facility].  Patient needs refills for Losartan (Prescribe by PCP), Nitroglycerin ([facility] confirm they have one refill on  file).  Confirmed delivery date of 02/23/2021 (first route), advised patient that pharmacy will contact them the morning of delivery.  Blood sugar readings: Fasting: 02/11/2021 it was 153. After meal: 02/10/2021 it was 223. Patient states her blood sugar was 192, and 108 while she was at the hospital.  [place] Pharmacist Assistant [phone]

## 2021-02-12 NOTE — Telephone Encounter (Signed)
Patient was scheduled for HFU on 02/24/2021.

## 2021-02-16 ENCOUNTER — Other Ambulatory Visit: Payer: Self-pay | Admitting: Family Medicine

## 2021-02-16 DIAGNOSIS — L851 Acquired keratosis [keratoderma] palmaris et plantaris: Secondary | ICD-10-CM | POA: Diagnosis not present

## 2021-02-16 DIAGNOSIS — B351 Tinea unguium: Secondary | ICD-10-CM | POA: Diagnosis not present

## 2021-02-16 DIAGNOSIS — E1140 Type 2 diabetes mellitus with diabetic neuropathy, unspecified: Secondary | ICD-10-CM | POA: Diagnosis not present

## 2021-02-16 DIAGNOSIS — I152 Hypertension secondary to endocrine disorders: Secondary | ICD-10-CM

## 2021-02-16 NOTE — Chronic Care Management (AMB) (Signed)
Chronic Care Management   CCM RN Visit Note   Name: Candace [name] MRN: [id number] DOB: 01-Oct-1950  Subjective: Candace [name] is a 70 y.o. year old female who is a primary care patient of [name]., MD. The care management team was consulted for assistance with disease management and care coordination needs.    Engaged with patient by telephone for follow up visit in response to provider referral for case management and care coordination services.   Consent to Services:  The patient was given information about Chronic Care Management services, agreed to services, and gave verbal consent prior to initiation of services.  Please see initial visit note for detailed documentation.    Assessment: Review of patient past medical history, allergies, medications, health status, including review of consultants reports, laboratory and other test data, was performed as part of comprehensive evaluation and provision of chronic care management services.   SDOH (Social Determinants of Health) assessments and interventions performed:  No  CCM Care Plan  Allergies  Allergen Reactions   Lisinopril Cough   Penicillins Swelling, Rash and Other (See Comments)    Did it involve swelling of the face/tongue/throat, SOB, or low BP? Yes Did it involve sudden or severe rash/hives, skin peeling, or any reaction on the inside of your mouth or nose? Yes Did you need to seek medical attention at a hospital or doctor's office? Yes When did it last happen?      15 years If all above answers are "NO", may proceed with cephalosporin use.     Outpatient Encounter Medications as of 02/11/2021  Medication Sig Note   acetaminophen (TYLENOL) 325 MG tablet Take 650 mg by mouth every 6 (six) hours as needed for moderate pain or headache.     albuterol (VENTOLIN HFA) 108 (90 Base) MCG/ACT inhaler Inhale 2 puffs into the lungs every 6 (six) hours as needed for wheezing or shortness of breath.    bismuth  subsalicylate (PEPTO BISMOL) 262 MG chewable tablet Chew 524 mg by mouth daily as needed for diarrhea or loose stools or indigestion.    clopidogrel (PLAVIX) 75 MG tablet Take 1 tablet (75 mg total) by mouth daily.    ELIQUIS 5 MG TABS tablet Take 5 mg by mouth 2 (two) times daily. 06/06/2020: Prescribed by Dr. Nehemiah Massed   ezetimibe (ZETIA) 10 MG tablet Take 1 tablet (10 mg total) by mouth daily.    furosemide (LASIX) 20 MG tablet Take 20 mg by mouth.    glucose blood test strip Use to check blood sugars daily as instructed    insulin glargine (LANTUS SOLOSTAR) 100 UNIT/ML Solostar Pen Inject 14 Units into the skin at bedtime.    Insulin Pen Needle 32G X 4 MM MISC Use to inject insulin daily    isosorbide mononitrate (IMDUR) 120 MG 24 hr tablet TAKE 1 TABLET BY MOUTH EVERY DAY (Patient taking differently: Take 120 mg by mouth daily.)    JARDIANCE 25 MG TABS tablet TAKE ONE TABLET o BEFORE BREAKFAST (Patient taking differently: Take 25 mg by mouth daily.)    Lancets (ONETOUCH DELICA PLUS [id number]) MISC USE UP TO 4 TIMES DAILY AS DIRECTED    loperamide (IMODIUM) 2 MG capsule Take 2 mg by mouth daily as needed for diarrhea or loose stools.    losartan (COZAAR) 25 MG tablet TAKE ONE TABLET BY MOUTH EVERYDAY AT BEDTIME (Patient taking differently: Take 25 mg by mouth at bedtime.)    metoprolol tartrate (LOPRESSOR) 100 MG tablet  Take 100 mg by mouth 2 (two) times daily. 06/06/2020: Prescribed by Dr. Nehemiah Massed    nitroGLYCERIN (NITROSTAT) 0.4 MG SL tablet Place 1 tablet (0.4 mg total) under the tongue every 5 (five) minutes as needed for chest pain.    nystatin (NYSTATIN) powder Apply 1 application topically daily. (Patient taking differently: Apply 1 application topically daily as needed.)    ondansetron (ZOFRAN-ODT) 8 MG disintegrating tablet Take 1 tablet (8 mg total) by mouth every 8 (eight) hours as needed. (Patient taking differently: Take 8 mg by mouth every 8 (eight) hours as needed for refractory  nausea / vomiting.)    pantoprazole (PROTONIX) 20 MG tablet Take 1 tablet (20 mg total) by mouth 2 (two) times daily.    rosuvastatin (CRESTOR) 40 MG tablet Take 1 tablet (40 mg total) by mouth daily.    sertraline (ZOLOFT) 100 MG tablet Take 1 tablet (100 mg total) by mouth daily.    sertraline (ZOLOFT) 50 MG tablet TAKE ONE TABLET BY MOUTH ONCE DAILY (Patient not taking: Reported on 05/69/7948)    TRULICITY 1.5 AX/6.5VV SOPN Inject 1.68m into THE SKIN ONCE A WEEK (Patient taking differently: Inject 1.5 mg as directed once a week.)    No facility-administered encounter medications on file as of 02/11/2021.    Patient Active Problem List   Diagnosis Date Noted   Vertigo    Unsteady gait    Acute bronchitis 03/07/2019   Esophageal dysphagia    Stomach irritation    Gastric polyp    Esophageal lump    Hx of colonic polyp    Polyp of colon    Diverticulosis of large intestine without diverticulitis    PSVT (paroxysmal supraventricular tachycardia) ([facility] 10/03/2018   Abnormal ECG 07/05/2018   Tachycardia 03/27/2018   Pain in limb 02/28/2018   Chronic diastolic CHF (congestive heart failure) ([facility] 11/03/2017   TIA (transient ischemic attack) 11/03/2017   COPD suggested by initial evaluation ([facility] 08/23/2017   Acute and chronic respiratory failure with hypoxia ([facility] 08/23/2017   Postoperative state 08/15/2017   Incidental pulmonary nodule 08/01/2017   Non-ST elevation myocardial infarction (NSTEMI), subendocardial infarction, subsequent episode of care ([facility] 06/01/2017   B12 deficiency 12/01/2016   Vitamin D deficiency 11/04/2016   Depression with anxiety 09/30/2016   AF (paroxysmal atrial fibrillation) ([facility] 09/30/2016   Resting tremor 09/30/2016   Mild obstructive sleep apnea 09/30/2016   Headache 08/18/2016   Unstable angina ([facility] 08/03/2016   Type 2 diabetes mellitus with complication, without long-term current use of insulin ([facility] 08/03/2016   Chronic daily headache  05/05/2016   Asthma 11/11/2015   S/P CABG x 4 11/10/2015   Coronary artery disease 11/07/2015   Carotid artery narrowing 02/08/2014   Chest pain 02/08/2014   Mixed hyperlipidemia 02/08/2014   Bilateral carotid artery stenosis 02/08/2014   Atypical chest pain 02/07/2014   Essential hypertension 02/07/2014   Awareness of heartbeats 02/07/2014   D (diarrhea) 10/05/2013   Gastroesophageal reflux disease 10/05/2013      Care Plan : RN Care Management Plan of Care       Problem: DM, HF, HTN, A-Fib and DM      Long-Range Goal: Disease Progression Prevented or Minimized   Start Date: 02/10/2021  Expected End Date: 05/11/2021  Priority: High  Note:   Current Barriers:  Chronic Disease Management support and education needs related to Atrial Fibrillation, CHF, HTN, and DMII  RNCM Clinical Goal(s):  Patient will continue to work with RN Care Manager to address  care management and care coordination needs related to  Atrial Fibrillation, CHF, HTN, and DMII through collaboration with RN Care manager, provider, and care team.   Interventions: 1:1 collaboration with primary care provider regarding development and update of comprehensive plan of care as evidenced by provider attestation and co-signature Inter-disciplinary care team collaboration (see longitudinal plan of care) Evaluation of current treatment plan related to  self management and patient's adherence to plan as established by provider  Hypertension Interventions: Last practice recorded BP readings:  BP Readings from Last 3 Encounters:  02/09/21 (!) 148/78  12/25/20 114/72  12/04/20 132/66  Most recent eGFR/CrCl:  Lab Results  Component Value Date   EGFR 67 12/02/2020    No components found for: CRCL  Reviewed medications. Reports taking as prescribed and tolerating current regimen. Reviewed medications that require new orders and refills including nitroglycerin. Will follow up with the [facility] Pharmacy team regarding  medications needed via [facility]. Reviewed established blood pressure parameters along with indications for notifying a provider. Encouraged to monitor and record readings.  Discussed compliance with recommended cardiac prudent diet. Encouraged to continue reading nutrition labels, monitoring sodium intake and avoid highly processed foods when possible. Discussed complications of uncontrolled blood pressure.  Discussed symptoms since recent visit to the Emergency Room. She was recently evaluated for d/t complaints of chest discomfort. Reports symptoms have resolved and feeling well at time of call. She is aware of need to follow up with the Cardiology team.  Reviewed s/sx of heart attack, stroke and worsening symptoms that require immediate medical attention.    Diabetes Interventions: Assessed patient's understanding of A1c goal: <7%  Lab Results  Component Value Date   HGBA1C 10.7 (H) 12/02/2020  Reviewed medications. Reports taking orals medications and insulin as prescribed. She will require additional Trulicity. Reports only having one pen left. Will follow up with the [place] team regarding availability via [facility]. Encouraged to continue taking as prescribed and update the care management team regarding concerns related to prescription cost. Reviewed blood sugar readings and importance of consistent blood glucose monitoring. Reports recent fasting readings have ranged in the 90's. Encouraged to continue monitoring and maintain a log. Reviewed s/sx of hypoglycemia and hyperglycemia along with recommended interventions. Discussed nutritional intake. Reports attempting to adhere to the recommended diet. Advised to continue monitoring carbohydrate intake and avoid concentrated sugars when possible. Update on 02/11/21: Patient requesting assistance with obtaining clobetasol cream and Trulicity. Will discuss options for obtaining Trulicity with the [facility] team.  A  request for clobetasol was previously forwarded to her PCP. Reports being unable to obtain the prescription from pharmacy. Marena Chancy is this is due to the initial request being ordered by her assigned gynecology provider. Will contact pharmacy and resubmit request if needed.   Heart Failure Interventions: Wt Readings from Last 3 Encounters:  02/09/21 164 lb 8 oz (74.6 kg)  12/25/20 164 lb (74.4 kg)  10/04/20 167 lb (75.8 kg)  Reviewed current plan for CHF management. Encouraged to continue taking medications as prescribed.  Reviewed recommended weight parameters. Encouraged to weigh at least once a week if unable to weigh daily and record readings. Reviewed need to notify provider for weight gain greater than 3 lbs overnight or weight gain greater than 5 lbs within a week.  Advised to continue monitoring sodium intake.  Reviewed s/sx of fluid overload and indications for notifying a provider. She was recently evaluated in the Emergency Department r/t complaints of chest pain. Denies episodes of  chest discomfort, palpitations or  shortness of breath at time of call. Denies increased abdominal or lower extremity edema. Denies changes in activity tolerance since ED visit.  Reviewed worsening s/sx related to CHF exacerbation and indications for seeking immediate medical attention.   AFIB Interventions:  Reviewed increased risk of stroke due to Afib. Advised to continue taking medications as prescribed Reviewed bleeding risk and importance of self-monitoring for signs/symptoms of bleeding Reviewed importance completing Cardiology visits as scheduled and completing laboratory monitoring as prescribed Reviewed s/sx that require immediate medical attention.     Patient Goals/Self-Care Activities: Patient will self administer medications as prescribed Patient will attend all scheduled provider appointments Patient will call pharmacy for medication refills Patient will continue to perform ADL's  independently Patient will continue to perform IADL's independently Patient will call provider office for new concerns or questions   Follow Up Plan:   Will follow up within the next month      PLAN Patient will discuss concerns regarding Trulicity with the [facility] team. Will follow up within the next month.   [name] Care Management [facility] [phone]

## 2021-02-17 ENCOUNTER — Telehealth: Payer: Self-pay

## 2021-02-17 ENCOUNTER — Ambulatory Visit: Payer: Self-pay

## 2021-02-17 DIAGNOSIS — E118 Type 2 diabetes mellitus with unspecified complications: Secondary | ICD-10-CM

## 2021-02-17 NOTE — Chronic Care Management (AMB) (Signed)
Chronic Care Management   CCM RN Visit Note  02/17/2021 Name: Candace [name] MRN: [id number] DOB: Feb 23, 1951  Subjective: Candace [name] is a 70 y.o. year old female who is a primary care patient of [name]., MD. The care management team was consulted for assistance with disease management and care coordination needs.    Care Coordination follow up was conducted today in response to patient's request for medications.  Consent to Services:  The patient was given information about Chronic Care Management services, agreed to services, and gave verbal consent prior to initiation of services.  Please see initial visit note for detailed documentation.   Assessment: Review of patient past medical history, allergies, medications, health status, including review of consultants reports, laboratory and other test data, was performed as part of comprehensive evaluation and provision of chronic care management services.   SDOH (Social Determinants of Health) assessments and interventions performed: No   CCM Care Plan  Allergies  Allergen Reactions   Lisinopril Cough   Penicillins Swelling, Rash and Other (See Comments)    Did it involve swelling of the face/tongue/throat, SOB, or low BP? Yes Did it involve sudden or severe rash/hives, skin peeling, or any reaction on the inside of your mouth or nose? Yes Did you need to seek medical attention at a hospital or doctor's office? Yes When did it last happen?      15 years If all above answers are "NO", may proceed with cephalosporin use.     Outpatient Encounter Medications as of 02/17/2021  Medication Sig Note   acetaminophen (TYLENOL) 325 MG tablet Take 650 mg by mouth every 6 (six) hours as needed for moderate pain or headache.     albuterol (VENTOLIN HFA) 108 (90 Base) MCG/ACT inhaler Inhale 2 puffs into the lungs every 6 (six) hours as needed for wheezing or shortness of breath.    bismuth subsalicylate (PEPTO BISMOL) 262 MG  chewable tablet Chew 524 mg by mouth daily as needed for diarrhea or loose stools or indigestion.    clopidogrel (PLAVIX) 75 MG tablet Take 1 tablet (75 mg total) by mouth daily.    ELIQUIS 5 MG TABS tablet Take 5 mg by mouth 2 (two) times daily. 06/06/2020: Prescribed by Dr. Nehemiah Massed   ezetimibe (ZETIA) 10 MG tablet Take 1 tablet (10 mg total) by mouth daily.    furosemide (LASIX) 20 MG tablet Take 20 mg by mouth.    glucose blood test strip Use to check blood sugars daily as instructed    insulin glargine (LANTUS SOLOSTAR) 100 UNIT/ML Solostar Pen Inject 14 Units into the skin at bedtime.    Insulin Pen Needle 32G X 4 MM MISC Use to inject insulin daily    isosorbide mononitrate (IMDUR) 120 MG 24 hr tablet TAKE 1 TABLET BY MOUTH EVERY DAY (Patient taking differently: Take 120 mg by mouth daily.)    JARDIANCE 25 MG TABS tablet TAKE ONE TABLET o BEFORE BREAKFAST (Patient taking differently: Take 25 mg by mouth daily.)    Lancets (ONETOUCH DELICA PLUS [id number]) MISC USE UP TO 4 TIMES DAILY AS DIRECTED    loperamide (IMODIUM) 2 MG capsule Take 2 mg by mouth daily as needed for diarrhea or loose stools.    losartan (COZAAR) 25 MG tablet Take 1 tablet (25 mg total) by mouth at bedtime.    metoprolol tartrate (LOPRESSOR) 100 MG tablet Take 100 mg by mouth 2 (two) times daily. 06/06/2020: Prescribed by Dr. Nehemiah Massed  nitroGLYCERIN (NITROSTAT) 0.4 MG SL tablet Place 1 tablet (0.4 mg total) under the tongue every 5 (five) minutes as needed for chest pain.    nystatin (NYSTATIN) powder Apply 1 application topically daily. (Patient taking differently: Apply 1 application topically daily as needed.)    ondansetron (ZOFRAN-ODT) 8 MG disintegrating tablet Take 1 tablet (8 mg total) by mouth every 8 (eight) hours as needed. (Patient taking differently: Take 8 mg by mouth every 8 (eight) hours as needed for refractory nausea / vomiting.)    pantoprazole (PROTONIX) 20 MG tablet Take 1 tablet (20 mg total) by mouth  2 (two) times daily.    rosuvastatin (CRESTOR) 40 MG tablet Take 1 tablet (40 mg total) by mouth daily.    sertraline (ZOLOFT) 100 MG tablet Take 1 tablet (100 mg total) by mouth daily.    sertraline (ZOLOFT) 50 MG tablet TAKE ONE TABLET BY MOUTH ONCE DAILY (Patient not taking: Reported on 70/26/3785)    TRULICITY 1.5 YI/5.0YD SOPN Inject 1.5mg  into THE SKIN ONCE A WEEK (Patient taking differently: Inject 1.5 mg as directed once a week.)    [DISCONTINUED] cetirizine (ZYRTEC) 5 MG tablet Take 2 tablets (10 mg total) by mouth daily.    No facility-administered encounter medications on file as of 02/17/2021.    Patient Active Problem List   Diagnosis Date Noted   Vertigo    Unsteady gait    Acute bronchitis 03/07/2019   Esophageal dysphagia    Stomach irritation    Gastric polyp    Esophageal lump    Hx of colonic polyp    Polyp of colon    Diverticulosis of large intestine without diverticulitis    PSVT (paroxysmal supraventricular tachycardia) ([place]) 10/03/2018   Abnormal ECG 07/05/2018   Tachycardia 03/27/2018   Pain in limb 02/28/2018   Chronic diastolic CHF (congestive heart failure) ([place]) 11/03/2017   TIA (transient ischemic attack) 11/03/2017   COPD suggested by initial evaluation ([place]) 08/23/2017   Acute and chronic respiratory failure with hypoxia ([place]) 08/23/2017   Postoperative state 08/15/2017   Incidental pulmonary nodule 08/01/2017   Non-ST elevation myocardial infarction (NSTEMI), subendocardial infarction, subsequent episode of care ([place]) 06/01/2017   B12 deficiency 12/01/2016   Vitamin D deficiency 11/04/2016   Depression with anxiety 09/30/2016   AF (paroxysmal atrial fibrillation) ([place]) 09/30/2016   Resting tremor 09/30/2016   Mild obstructive sleep apnea 09/30/2016   Headache 08/18/2016   Unstable angina ([place]) 08/03/2016   Type 2 diabetes mellitus with complication, without long-term current use of insulin ([place]) 08/03/2016   Chronic daily headache 05/05/2016    Asthma 11/11/2015   S/P CABG x 4 11/10/2015   Coronary artery disease 11/07/2015   Carotid artery narrowing 02/08/2014   Chest pain 02/08/2014   Mixed hyperlipidemia 02/08/2014   Bilateral carotid artery stenosis 02/08/2014   Atypical chest pain 02/07/2014   Essential hypertension 02/07/2014   Awareness of heartbeats 02/07/2014   D (diarrhea) 10/05/2013   Gastroesophageal reflux disease 10/05/2013     Patient Care Plan: RN Care Management Plan of Care     Problem Identified: DM, HF, HTN, A-Fib and DM      Long-Range Goal: Disease Progression Prevented or Minimized   Start Date: 02/10/2021  Expected End Date: 05/11/2021  Priority: High  Note:   Current Barriers:  Chronic Disease Management support and education needs related to Atrial Fibrillation, CHF, HTN, and DMII  RNCM Clinical Goal(s):  Patient will continue to work with RN Care Manager to address care  management and care coordination needs related to  Atrial Fibrillation, CHF, HTN, and DMII through collaboration with RN Care manager, provider, and care team.   Interventions: 1:1 collaboration with primary care provider regarding development and update of comprehensive plan of care as evidenced by provider attestation and co-signature Inter-disciplinary care team collaboration (see longitudinal plan of care) Evaluation of current treatment plan related to  self management and patient's adherence to plan as established by provider  Diabetes Interventions:  Lab Results  Component Value Date   HGBA1C 10.7 (H) 12/02/2020   Medications reviewed Patient called requesting additional Lantus. Reports having to discard one of the insulin pens d/t it not functioning properly. She now has one pen left and anticipates running out on 02/23/21. Per chart review, patient is due for next adherence delivery on 02/23/21. Will collaborate with CCM Pharmacist to ensure Lantus can be added to the upcoming delivery.    Patient  Goals/Self-Care Activities: Patient will self administer medications as prescribed Patient will attend all scheduled provider appointments Patient will call pharmacy for medication refills Patient will continue to perform ADL's independently Patient will continue to perform IADL's independently Patient will call provider office for new concerns or questions        PLAN Message forwarded to [facility] Pharmacist regarding Lantus. Will complete care management outreach within the next month.   [name] Care Management [facility] [phone]

## 2021-02-17 NOTE — Progress Notes (Signed)
Completed acute form for Lantus Solostar pen to be deliver on 02/23/2021, and sent to clinical pharmacist for review.  [place] Pharmacist Assistant [phone]

## 2021-02-18 NOTE — Progress Notes (Signed)
Per [facility], there was an acute form for her to add lantus to the delivery, but she got a 86 day supply on 01/13/21 which was 4 pens. That will not go through insurance until January, if she had a pen malfunction then she will have the call the manufacturer for them to send her replacement we are not able to do that.   Per Clinical pharmacist, when you get a chance before you leave today would you be able to give her a call and let her know? Looks like the [employer] number is [phone].  Patient was notified of the information above, and aware if they are unable to replace her pen to return my call.  [facility] Pharmacist Assistant [phone]

## 2021-02-24 ENCOUNTER — Ambulatory Visit: Payer: Medicare Other

## 2021-02-24 DIAGNOSIS — I5032 Chronic diastolic (congestive) heart failure: Secondary | ICD-10-CM

## 2021-02-24 DIAGNOSIS — E118 Type 2 diabetes mellitus with unspecified complications: Secondary | ICD-10-CM

## 2021-02-24 NOTE — Progress Notes (Signed)
Chronic Care Management Pharmacy Note  02/24/2021 Name:  Candace [name] MRN:  [id number] DOB:  29-Jan-1951  Summary:  Patient presents for CCM follow-up. Chest pain has resolved. Home blood sugars are improving, although patient reports one instance of relative hypoglycemia.  Recommendations/Changes made from today's visit: Continue current medications Recheck A1c next month  Plan: CPP Follow-up in 4 weeks   Recommended Problem List Changes:   Modify:  Type 2 diabetes mellitus with diabetic polyneuropathy, with long-term current use of insulin  Subjective: Candace [name] is an 70 y.o. year old female who is a primary patient of [name]., MD.  The CCM team was consulted for assistance with disease management and care coordination needs.    Engaged with patient by telephone for follow up visit in response to provider referral for pharmacy case management and/or care coordination services.   Consent to Services:  The patient was given information about Chronic Care Management services, agreed to services, and gave verbal consent prior to initiation of services.  Please see initial visit note for detailed documentation.   Patient Care Team: [name]., MD as PCP - General (Family Medicine) Corey Skains, MD as Consulting Physician (Cardiology) Sharlotte Alamo, DPM (Podiatry) Neldon Labella, RN as Case Manager Virgel Manifold, MD as Consulting Physician (Gastroenterology) Pa, [place], Glean Salvo, [facility] as Pharmacist (Pharmacist)  Recent office visits: 01/07/21: Patient presented to Dr. Rosanna Randy for ear pain and cough. Albuterol, doxycycline 100 mg twice daily.  12/02/20: Patient presented to Dr. Rosanna Randy for follow-up. Sertralien 100 mg.  08/11/20: Patient presented to Dr. Rosanna Randy for follow-up. A1c improved to 9.1%. Patient started on losartan 25 mg daily.  05/13/20: Patient presented to Dr. Rosanna Randy for viral URI. Patient started  on benzonatate.  05/01/20: Patient presented to Dr. Rosanna Randy for follow-up. A1c 12.2%. Patient started on Trulicity 7.82 mg weekly. Carafate  04/02/20: Patient presented to [facility], LPN for AWV.   Recent consult visits: 08/07/20: Patient presented to Dr. Nehemiah Massed (Cardiology) for follow-up. No medication changes noted.  07/07/20: Patient presented to Dr. Nehemiah Massed (Cardiology) for follow-up. No medication changes noted. 02/27/21: Patient presented to Hassell Done, NP. Patient started on Eliquis 5 mg twice daily for A-Fib. Non-adherence to atorvastatin   Hospital visits: 07/09/20: Patient hospitalized for chest pain. Aspirin stopped. Clopidogrel 7.52m daily, Ezetimibe 10 mg daily started. 3/22-3/23: Patient hospitalized for chest pain. Demand ischemia, possible pericarditis. Patient started on aspirin 81 mg daily, colchicine 0.6 mg twice daily   Objective:  Lab Results  Component Value Date   CREATININE 0.69 02/09/2021   BUN 8 02/09/2021   GFRNONAA >60 02/09/2021   GFRAA 108 02/06/2020   NA 139 02/09/2021   K 3.9 02/09/2021   CALCIUM 9.0 02/09/2021   CO2 25 02/09/2021    Lab Results  Component Value Date/Time   HGBA1C 10.7 (H) 12/02/2020 04:06 PM   HGBA1C 9.1 (H) 08/11/2020 09:32 AM   HGBA1C 9.2 07/10/2019 12:00 AM   HGBA1C 8.3 (H) 07/14/2013 05:42 AM   MICROALBUR 50 08/11/2020 08:51 AM    Last diabetic Eye exam:  Lab Results  Component Value Date/Time   HMDIABEYEEXA No Retinopathy 06/28/2019 12:00 AM    Last diabetic Foot exam: No results found for: HMDIABFOOTEX   Lab Results  Component Value Date   CHOL 164 08/11/2020   HDL 57 08/11/2020   LDLCALC 90 08/11/2020   TRIG 94 08/11/2020   CHOLHDL 2.9 08/11/2020    Hepatic Function  Latest Ref Rng & Units 02/09/2021 02/08/2021 12/02/2020  Total Protein 6.5 - 8.1 g/dL 6.1(L) 6.2(L) 6.2  Albumin 3.5 - 5.0 g/dL 3.4(L) 3.6 4.0  AST 15 - 41 U/L _0 ALT 0 - 44 U/L _1 Alk Phosphatase 38 - 126 U/L 76 80 108   Total Bilirubin 0.3 - 1.2 mg/dL 0.4 0.5 0.2  Bilirubin, Direct 0.0 - 0.2 mg/dL - 0.1 -    Lab Results  Component Value Date/Time   TSH 1.180 01/03/2019 09:57 AM   TSH 1.262 09/14/2018 05:04 PM   TSH 1.661 12/23/2016 07:03 PM   TSH 0.818 10/01/2016 09:35 AM    CBC Latest Ref Rng & Units 02/09/2021 02/08/2021 12/04/2020  WBC 4.0 - 10.5 K/uL 6.0 6.7 6.1  Hemoglobin 12.0 - 15.0 g/dL 12.1 13.0 12.3  Hematocrit 36.0 - 46.0 % 38.7 40.8 36.9  Platelets 150 - 400 K/uL 180 191 185    Lab Results  Component Value Date/Time   VD25OH 12.2 (L) 10/01/2016 09:35 AM    Clinical ASCVD: Yes  The ASCVD Risk score (Arnett DK, et al., 2019) failed to calculate for the following reasons:   The patient has a prior MI or stroke diagnosis    Depression screen [facility] 2/9 10/29/2020 08/05/2020 04/15/2020  Decreased Interest 0 1 0  Down, Depressed, Hopeless 0 1 0  PHQ - 2 Score 0 2 0  Altered sleeping 1 1 -  Tired, decreased energy 1 1 -  Change in appetite 0 1 -  Feeling bad or failure about yourself  0 0 -  Trouble concentrating 0 0 -  Moving slowly or fidgety/restless 1 0 -  Suicidal thoughts 0 0 -  PHQ-9 Score 3 5 -  Difficult doing work/chores Not difficult at all Not difficult at all -  Some recent data might be hidden    Social History   Tobacco Use  Smoking Status Former   Packs/day: 0.50   Types: Cigarettes   Quit date: 10/07/2001   Years since quitting: 19.3  Smokeless Tobacco Never   BP Readings from Last 3 Encounters:  02/09/21 (!) 148/78  12/25/20 114/72  12/04/20 132/66   Pulse Readings from Last 3 Encounters:  02/09/21 74  12/25/20 88  12/04/20 75   Wt Readings from Last 3 Encounters:  02/09/21 164 lb 8 oz (74.6 kg)  12/25/20 164 lb (74.4 kg)  10/04/20 167 lb (75.8 kg)    Assessment/Interventions: Review of patient past medical history, allergies, medications, health status, including review of consultants reports, laboratory and other test data, was performed as part  of comprehensive evaluation and provision of chronic care management services.   SDOH:  (Social Determinants of Health) assessments and interventions performed: Yes SDOH Interventions    Flowsheet Row Most Recent Value  SDOH Interventions   Financial Strain Interventions Intervention Not Indicated           CCM Care Plan  Allergies  Allergen Reactions   Lisinopril Cough   Penicillins Swelling, Rash and Other (See Comments)    Did it involve swelling of the face/tongue/throat, SOB, or low BP? Yes Did it involve sudden or severe rash/hives, skin peeling, or any reaction on the inside of your mouth or nose? Yes Did you need to seek medical attention at a hospital or doctor's office? Yes When did it last happen?      15 years If all above answers are "NO", may proceed with cephalosporin use.  Medications Reviewed Today     Reviewed by Neldon Labella, RN (Registered Nurse) on 02/17/21 at 1013  Med List Status: <None>   Medication Order Taking? Sig Documenting Provider Last Dose Status Informant  acetaminophen (TYLENOL) 325 MG tablet [id number] No Take 650 mg by mouth every 6 (six) hours as needed for moderate pain or headache.  [provider] Past Month Active Self  albuterol (VENTOLIN HFA) 108 (90 Base) MCG/ACT inhaler [id number] No Inhale 2 puffs into the lungs every 6 (six) hours as needed for wheezing or shortness of breath. [name]., MD Past Month Active Self  bismuth subsalicylate (PEPTO BISMOL) 262 MG chewable tablet [id number] No Chew 524 mg by mouth daily as needed for diarrhea or loose stools or indigestion. [provider] Past Month Active Self  Discontinued 03/06/19 1830   clopidogrel (PLAVIX) 75 MG tablet [id number] No Take 1 tablet (75 mg total) by mouth daily. [name]., MD 02/08/2021 Active Self  ELIQUIS 5 MG TABS tablet [id number] No Take 5 mg by mouth 2 (two) times daily. [provider] 02/08/2021 0730 Active  Self           Med Note Michaelle Birks, Sayra Frisby A   Fri Jun 06, 2020 11:56 AM) Prescribed by Dr. Nehemiah Massed  ezetimibe (ZETIA) 10 MG tablet [id number] No Take 1 tablet (10 mg total) by mouth daily. [name]., MD 02/08/2021 Active Self  furosemide (LASIX) 20 MG tablet [id number] No Take 20 mg by mouth. [provider] 02/08/2021 Active Self  glucose blood test strip [id number] No Use to check blood sugars daily as instructed [name]., MD Taking Active Self  insulin glargine (LANTUS SOLOSTAR) 100 UNIT/ML Solostar Pen [id number] No Inject 14 Units into the skin at bedtime. [name]., MD 02/07/2021 Active Self  Insulin Pen Needle 32G X 4 MM MISC [id number] No Use to inject insulin daily [name]., MD Taking Active Self  isosorbide mononitrate (IMDUR) 120 MG 24 hr tablet [id number] No TAKE 1 TABLET BY MOUTH EVERY DAY  Patient taking differently: Take 120 mg by mouth daily.   [name]., MD 02/08/2021 Active Self  JARDIANCE 25 MG TABS tablet [id number] No TAKE ONE TABLET o BEFORE BREAKFAST  Patient taking differently: Take 25 mg by mouth daily.   [name]., MD 02/08/2021 Active Self  Lancets (ONETOUCH DELICA PLUS [id number]) MISC [id number] No USE UP TO 4 TIMES DAILY AS DIRECTED [name]., MD Taking Active Self  loperamide (IMODIUM) 2 MG capsule [id number] No Take 2 mg by mouth daily as needed for diarrhea or loose stools. [provider] Past Week Active Self  losartan (COZAAR) 25 MG tablet [id number]  Take 1 tablet (25 mg total) by mouth at bedtime. [name]., MD  Active   metoprolol tartrate (LOPRESSOR) 100 MG tablet [id number] No Take 100 mg by mouth 2 (two) times daily. [provider] 02/08/2021 0730 Active Self           Med Note Michaelle Birks, Cathe Mons A   Fri Jun 06, 2020 11:56 AM) Prescribed by Dr. Nehemiah Massed   nitroGLYCERIN (NITROSTAT) 0.4 MG SL tablet [id number] No Place 1 tablet (0.4 mg  total) under the tongue every 5 (five) minutes as needed for chest pain. Bettey Costa, MD 02/08/2021 Active Self           Med Note Johny Drilling Mar 27, 2018  2:39 PM)  nystatin (NYSTATIN) powder [id number] No Apply 1 application topically daily.  Patient taking differently: Apply 1 application topically daily as needed.   Rubie Maid, MD Past Month Active Self  ondansetron (ZOFRAN-ODT) 8 MG disintegrating tablet [id number] No Take 1 tablet (8 mg total) by mouth every 8 (eight) hours as needed.  Patient taking differently: Take 8 mg by mouth every 8 (eight) hours as needed for refractory nausea / vomiting.   [name]., MD 02/08/2021 Active Self  pantoprazole (PROTONIX) 20 MG tablet [id number] No Take 1 tablet (20 mg total) by mouth 2 (two) times daily. [name]., MD 02/08/2021 Active Self  rosuvastatin (CRESTOR) 40 MG tablet [id number] No Take 1 tablet (40 mg total) by mouth daily. [name]., MD 02/08/2021 Active Self  sertraline (ZOLOFT) 100 MG tablet [id number] No Take 1 tablet (100 mg total) by mouth daily. [name]., MD 02/08/2021 Active Self  sertraline (ZOLOFT) 50 MG tablet [id number] No TAKE ONE TABLET BY MOUTH ONCE DAILY  Patient not taking: Reported on 02/08/2021   [name]., MD Not Taking Active Self  TRULICITY 1.5 UK/0.2RK [facility] [id number] No Inject 1.4m into THE SKIN ONCE A WEEK  Patient taking differently: Inject 1.5 mg as directed once a week.   GJerrol Banana, MD 02/05/2021 Active Self            Patient Active Problem List   Diagnosis Date Noted   Vertigo    Unsteady gait    Acute bronchitis 03/07/2019   Esophageal dysphagia    Stomach irritation    Gastric polyp    Esophageal lump    Hx of colonic polyp    Polyp of colon    Diverticulosis of large intestine without diverticulitis    PSVT (paroxysmal supraventricular tachycardia) (HCC) 10/03/2018   Abnormal ECG 07/05/2018   Tachycardia  03/27/2018   Pain in limb 02/28/2018   Chronic diastolic CHF (congestive heart failure) ([place] 11/03/2017   TIA (transient ischemic attack) 11/03/2017   COPD suggested by initial evaluation ([facility] 08/23/2017   Acute and chronic respiratory failure with hypoxia ([facility] 08/23/2017   Postoperative state 08/15/2017   Incidental pulmonary nodule 08/01/2017   Non-ST elevation myocardial infarction (NSTEMI), subendocardial infarction, subsequent episode of care ([facility] 06/01/2017   B12 deficiency 12/01/2016   Vitamin D deficiency 11/04/2016   Depression with anxiety 09/30/2016   AF (paroxysmal atrial fibrillation) ([place] 09/30/2016   Resting tremor 09/30/2016   Mild obstructive sleep apnea 09/30/2016   Headache 08/18/2016   Unstable angina ([facility] 08/03/2016   Type 2 diabetes mellitus with complication, without long-term current use of insulin ([facility] 08/03/2016   Chronic daily headache 05/05/2016   Asthma 11/11/2015   S/P CABG x 4 11/10/2015   Coronary artery disease 11/07/2015   Carotid artery narrowing 02/08/2014   Chest pain 02/08/2014   Mixed hyperlipidemia 02/08/2014   Bilateral carotid artery stenosis 02/08/2014   Atypical chest pain 02/07/2014   Essential hypertension 02/07/2014   Awareness of heartbeats 02/07/2014   D (diarrhea) 10/05/2013   Gastroesophageal reflux disease 10/05/2013    Immunization History  Administered Date(s) Administered   Fluad Quad(high Dose 65+) 12/28/2018, 12/25/2020   Influenza, High Dose Seasonal PF 12/24/2016, 12/08/2017   Influenza,inj,Quad PF,6+ Mos 12/19/2015, 01/18/2020   Influenza-Unspecified 03/06/2015   Moderna Sars-Covid-2 Vaccination 04/14/2019, 05/12/2019, 01/28/2020   Pneumococcal Conjugate-13 03/16/2018   Pneumococcal Polysaccharide-23 12/24/2016   Tdap 07/28/2010   Zoster Recombinat (Shingrix) 06/14/2020    Conditions  to be addressed/monitored:  Hypertension, Hyperlipidemia, Diabetes, Atrial Fibrillation, Heart Failure, Coronary Artery  Disease, GERD, Asthma, Depression and Anxiety  Care Plan : General Pharmacy (Adult)  Updates made by Germaine Pomfret, RPH since 02/24/2021 12:00 AM     Problem: Hypertension, Hyperlipidemia, Diabetes, Atrial Fibrillation, Heart Failure, Coronary Artery Disease, GERD, Asthma, Depression and Anxiety   Priority: High     Long-Range Goal: Patient-Specific Goal   Start Date: 06/06/2020  Expected End Date: 11/25/2021  This Visit's Progress: On track  Recent Progress: On track  Priority: High  Note:   Current Barriers:  Unable to achieve control of diabetes  Unable to achieve control of cholesterol Does not adhere to prescribed medication regimen  Pharmacist Clinical Goal(s):  Over the next 90 days, patient will achieve control of diabetes as evidenced by A1c less than 8% patient will achieve control of cholesterol as evidenced by LDL less than 70 adhere to prescribed medication regimen as evidenced by utilization of enhanced pharmacy services through collaboration with PharmD and provider.   Interventions: 1:1 collaboration with [name]., MD regarding development and update of comprehensive plan of care as evidenced by provider attestation and co-signature Inter-disciplinary care team collaboration (see longitudinal plan of care) Comprehensive medication review performed; medication list updated in electronic medical record  Diabetes (A1c goal <7%) -Uncontrolled -Diagnosed 2010 -Current medications: Trulicity 1.5 mg weekly on Tuesdays   Jardiance 25 mg daily  Lantus 16 units nightly  -Medications previously tried: Glipizide, Lantus, Tradjenta, Metformin, Metformin XR, Ozempic   -Current home glucose readings:    Fasting  28-Nov 137  27-Nov 143  26-Nov 120  25-Nov 143  24-Nov 157  23-Nov 202  22-Nov 136  21-Nov 143  20-Nov 147  19-Nov 173  18-Nov 157  Average 151    -Reports relative hypoglycemic symptoms: light-headed, sweaty, "feeling bad." Occurred  around 120s.  -Current meal patterns: trying to eat more salads, water. Limiting carbohydrates when possible. 24 hour recall:  breakfast: Yogurt + Banana  lunch: Broccoli + Grapes (handful)  dinner: Mac&Cheese  + Broccoli + BBQ Ribs  snacks: None drinks: Glucerna shakes (1x daily), water, regular ginger ale  -Current exercise: walking daily (15 minute sessions)  -Plan to recheck A1c next month.  -Continue current medications  Hyperlipidemia: (LDL goal < 70) -History of CABG x4 2017, 2018  -Uncontrolled -Current treatment: Rosuvastatin 40 mg daily Ezetimibe 10 mg daily   -Current antiplatelet treatment: Clopidogrel 75 mg daily  -Medications previously tried: Atorvastatin (changed to rosuvastatin during hospitalization) -Cholesterol improved, but still uncontrolled. Could consider switching Zetia to Repatha for improved benefit. Will defer for now while adjusting diabetes regimen.  -Recommended to continue current medication  Atrial Fibrillation (Goal: prevent stroke and major bleeding) -Controlled -CHADSVASC: 6 -Current treatment: Rate control: Metoprolol tartrate 100 mg twice daily  Anticoagulation: Eliquis 5 mg twice daily  -Medications previously tried: NA -Home BP and HR readings: NA  -Counseled on importance of adherence to anticoagulant exactly as prescribed; avoidance of NSAIDs due to increased bleeding risk with anticoagulants; Careful monitoring due to increased bleeding risk with clopidogrel + Eliquis -Recommended to continue current medication  Heart Failure (Goal: manage symptoms and prevent exacerbations) -Controlled -Last ejection fraction: 55-60% (Date: June 2021) -HF type: Diastolic -NYHA Class: III (marked limitation of activity) -AHA HF Stage: C (Heart disease and symptoms present) -Current treatment: Imdur 30 mg daily  Losartan 25 mg daily Metoprolol tartrate 100 mg twice daily  -Medications previously tried: NA  -Current home  BP/HR readings: 117/80,  108/72  -Educated on Importance of weighing daily; if you gain more than 3 pounds in one day or 5 pounds in one week, contact provider's office -Recommended to continue current medication  Depression/Anxiety (Goal: maintain stable mood) -Controlled -Current treatment: Sertraline 100 mg daily  -Medications previously tried/failed: NA -PHQ9: 0 -GAD7: 1 - Patient reports she manages her stress by talking with daughter, reading, or medidating  -Continue current medications  GERD with dysphagia (Goal: prevent heartburn or reflux symptoms) -Controlled -Current treatment  Pantoprazole 40 mg daily   -Medications previously tried: Sucralfate,  -Continue current medications  Patient Goals/Self-Care Activities Over the next 90 days, patient will:  - check glucose daily, before breakfast, document, and provide at future appointments check blood pressure weekly, document, and provide at future appointments  Follow Up Plan: Face to Face appointment with care management team member scheduled for: 03/24/21 at 9:15 AM     Medication Assistance: None required.  Patient affirms current coverage meets needs.  Patient's preferred pharmacy is:  [facility] - [place], [place] - [address] [place] [address] Phone: [phone] Fax: [phone]  [id number] [id number]: [phone]: [phone] [id number] [place] [address] [place]: [phone]: [phone] discussed: Benefits of medication synchronization, packaging and delivery as well as enhanced pharmacist oversight with [facility]. Patient decided to: Utilize [facility] for medication synchronization, packaging and delivery   Care Plan and Follow Up Patient Decision:  Patient agrees to Care Plan and  Follow-up.  Plan: Face to Face appointment with care management team member scheduled for: 03/24/21 at 9:[place]ePharmD, BCPara MarchCPWhite [phone]

## 2021-02-24 NOTE — Patient Instructions (Signed)
Visit Information It was great speaking with you today!  Please let me know if you have any questions about our visit.   Goals Addressed             This Visit's Progress    Monitor and Manage My Blood Sugar-Diabetes Type 2   On track    Timeframe:  Long-Range Goal Priority:  High Start Date: 04/15/20      Expected End Date: 02/26/22                   Follow Up within 30 days   - Check blood sugar daily before breakfast - Check blood sugar if I feel it is too high or too low - Enter blood sugar readings and medication or insulin into daily log - Take the blood sugar log to all doctor visits - Take the blood sugar meter to all doctor visits          Patient Care Plan: General Pharmacy (Adult)     Problem Identified: Hypertension, Hyperlipidemia, Diabetes, Atrial Fibrillation, Heart Failure, Coronary Artery Disease, GERD, Asthma, Depression and Anxiety   Priority: High     Long-Range Goal: Patient-Specific Goal   Start Date: 06/06/2020  Expected End Date: 11/25/2021  This Visit's Progress: On track  Recent Progress: On track  Priority: High  Note:   Current Barriers:  Unable to achieve control of diabetes  Unable to achieve control of cholesterol Does not adhere to prescribed medication regimen  Pharmacist Clinical Goal(s):  Over the next 90 days, patient will achieve control of diabetes as evidenced by A1c less than 8% patient will achieve control of cholesterol as evidenced by LDL less than 70 adhere to prescribed medication regimen as evidenced by utilization of enhanced pharmacy services through collaboration with PharmD and provider.   Interventions: 1:1 collaboration with [name]., MD regarding development and update of comprehensive plan of care as evidenced by provider attestation and co-signature Inter-disciplinary care team collaboration (see longitudinal plan of care) Comprehensive medication review performed; medication list updated in  electronic medical record  Diabetes (A1c goal <7%) -Uncontrolled -Diagnosed 2010 -Current medications: Trulicity 1.5 mg weekly on Tuesdays   Jardiance 25 mg daily  Lantus 16 units nightly  -Medications previously tried: Glipizide, Lantus, Tradjenta, Metformin, Metformin XR, Ozempic   -Current home glucose readings:    Fasting  28-Nov 137  27-Nov 143  26-Nov 120  25-Nov 143  24-Nov 157  23-Nov 202  22-Nov 136  21-Nov 143  20-Nov 147  19-Nov 173  18-Nov 157  Average 151    -Reports relative hypoglycemic symptoms: light-headed, sweaty, "feeling bad." Occurred around 120s.  -Current meal patterns: trying to eat more salads, water. Limiting carbohydrates when possible. 24 hour recall:  breakfast: Yogurt + Banana  lunch: Broccoli + Grapes (handful)  dinner: Mac&Cheese  + Broccoli + BBQ Ribs  snacks: None drinks: Glucerna shakes (1x daily), water, regular ginger ale  -Current exercise: walking daily (15 minute sessions)  -Plan to recheck A1c next month.  -Continue current medications  Hyperlipidemia: (LDL goal < 70) -History of CABG x4 2017, 2018  -Uncontrolled -Current treatment: Rosuvastatin 40 mg daily Ezetimibe 10 mg daily   -Current antiplatelet treatment: Clopidogrel 75 mg daily  -Medications previously tried: Atorvastatin (changed to rosuvastatin during hospitalization) -Cholesterol improved, but still uncontrolled. Could consider switching Zetia to Repatha for improved benefit. Will defer for now while adjusting diabetes regimen.  -Recommended to continue current medication  Atrial Fibrillation (Goal: prevent  stroke and major bleeding) -Controlled -CHADSVASC: 6 -Current treatment: Rate control: Metoprolol tartrate 100 mg twice daily  Anticoagulation: Eliquis 5 mg twice daily  -Medications previously tried: NA -Home BP and HR readings: NA  -Counseled on importance of adherence to anticoagulant exactly as prescribed; avoidance of NSAIDs due to increased  bleeding risk with anticoagulants; Careful monitoring due to increased bleeding risk with clopidogrel + Eliquis -Recommended to continue current medication  Heart Failure (Goal: manage symptoms and prevent exacerbations) -Controlled -Last ejection fraction: 55-60% (Date: June 2021) -HF type: Diastolic -NYHA Class: III (marked limitation of activity) -AHA HF Stage: C (Heart disease and symptoms present) -Current treatment: Imdur 30 mg daily  Losartan 25 mg daily Metoprolol tartrate 100 mg twice daily  -Medications previously tried: NA  -Current home BP/HR readings: 117/80, 108/72  -Educated on Importance of weighing daily; if you gain more than 3 pounds in one day or 5 pounds in one week, contact provider's office -Recommended to continue current medication  Depression/Anxiety (Goal: maintain stable mood) -Controlled -Current treatment: Sertraline 100 mg daily  -Medications previously tried/failed: NA -PHQ9: 0 -GAD7: 1 - Patient reports she manages her stress by talking with daughter, reading, or medidating  -Continue current medications  GERD with dysphagia (Goal: prevent heartburn or reflux symptoms) -Controlled -Current treatment  Pantoprazole 40 mg daily   -Medications previously tried: Sucralfate,  -Continue current medications  Patient Goals/Self-Care Activities Over the next 90 days, patient will:  - check glucose daily, before breakfast, document, and provide at future appointments check blood pressure weekly, document, and provide at future appointments  Follow Up Plan: Face to Face appointment with care management team member scheduled for: 03/24/21 at 9:15 AM    Patient agreed to services and verbal consent obtained.   Print copy of patient instructions, educational materials, and care plan provided in person.  Junius Argyle, PharmD, Para March, CPP  Clinical Pharmacist Practitioner  [facility] [phone]

## 2021-02-25 DIAGNOSIS — I10 Essential (primary) hypertension: Secondary | ICD-10-CM

## 2021-02-25 DIAGNOSIS — E118 Type 2 diabetes mellitus with unspecified complications: Secondary | ICD-10-CM

## 2021-02-25 DIAGNOSIS — I5032 Chronic diastolic (congestive) heart failure: Secondary | ICD-10-CM | POA: Diagnosis not present

## 2021-02-25 DIAGNOSIS — I480 Paroxysmal atrial fibrillation: Secondary | ICD-10-CM | POA: Diagnosis not present

## 2021-03-05 ENCOUNTER — Other Ambulatory Visit: Payer: Self-pay | Admitting: Family Medicine

## 2021-03-05 ENCOUNTER — Telehealth: Payer: Self-pay | Admitting: Family Medicine

## 2021-03-05 ENCOUNTER — Telehealth: Payer: Self-pay

## 2021-03-05 DIAGNOSIS — E118 Type 2 diabetes mellitus with unspecified complications: Secondary | ICD-10-CM

## 2021-03-05 DIAGNOSIS — E1165 Type 2 diabetes mellitus with hyperglycemia: Secondary | ICD-10-CM

## 2021-03-05 MED ORDER — TRULICITY 1.5 MG/0.5ML ~~LOC~~ SOAJ: SUBCUTANEOUS | 2 refills | Status: DC

## 2021-03-05 MED ORDER — ONETOUCH VERIO W/DEVICE KIT: PACK | 0 refills | Status: AC

## 2021-03-05 NOTE — Telephone Encounter (Signed)
Prescription for new meter sent into pharmacy.

## 2021-03-05 NOTE — Progress Notes (Signed)
Per [facility], Patient states her one touch meter is messed up and she is needing a new one, can we have a script for that sent in please?  Per Clinical Pharmacist, please reach out to the patient and try to see what the problem is? It could be a simple battery change issue, or if she is getting an error message that would be helpful as well. If not, let me know what kind of One Touch meter she uses.   Patient states her Blood sugar meter was saying battery low,and would not read her blood sugar reading, and then the screen said error and shut off.Patient states she would like another meter since she had this meter for three years.Notified clinical pharmacist.  Blood sugar readings:(patient states she has not been able to check her blood sugar on 12/06-12/08 since her meter is not working correctly).  On 03/02/2021 it was 123. On 03/01/2021 it was 137. On 02/28/2021 it was 143 On 02/27/2021 it was 157. On 02/26/2021 it was 133.  [place] Pharmacist Assistant [phone]

## 2021-03-05 NOTE — Telephone Encounter (Addendum)
Pt states her glucose meter will not work anymore, and she has no way to check her sugar. Pt has a [employer].   Would like new glucometer sent to   [place], [place] - [address]

## 2021-03-05 NOTE — Telephone Encounter (Signed)
Medication Refill - Medication: TRULICITY 1.5 TG/2.5WL SOPN  Has the patient contacted their pharmacy? No. Pt is taking her last injection today, and has no more  Preferred Pharmacy (with phone number or street name): [facility] - [place], [place] - [address] Has the patient been seen for an appointment in the last year OR does the patient have an upcoming appointment? Yes.    Agent: Please be advised that RX refills may take up to 3 business days. We ask that you follow-up with your pharmacy.

## 2021-03-11 ENCOUNTER — Telehealth: Payer: Self-pay

## 2021-03-11 NOTE — Progress Notes (Signed)
° ° °Chronic Care Management °Pharmacy Assistant  ° °Name: Candace [name]  MRN: [id number] DOB: 05/22/1950 ° °Reason for Encounter: Medication Review/Medication Coordination Call. °  °Recent office visits:  °No recent office visits ° °Recent consult visits:  °No recent consult Visit ° °Hospital visits:  °None in previous 6 months ° °Medications: °Outpatient Encounter Medications as of 03/11/2021  °Medication Sig Note  ° acetaminophen (TYLENOL) 325 MG tablet Take 650 mg by mouth every 6 (six) hours as needed for moderate pain or headache.    ° albuterol (VENTOLIN HFA) 108 (90 Base) MCG/ACT inhaler Inhale 2 puffs into the lungs every 6 (six) hours as needed for wheezing or shortness of breath.   ° bismuth subsalicylate (PEPTO BISMOL) 262 MG chewable tablet Chew 524 mg by mouth daily as needed for diarrhea or loose stools or indigestion.   ° Blood Glucose Monitoring Suppl (ONETOUCH VERIO) w/Device KIT Use daily to check blood sugar   ° clopidogrel (PLAVIX) 75 MG tablet Take 1 tablet (75 mg total) by mouth daily.   ° Dulaglutide (TRULICITY) 1.5 MG/0.5ML SOPN Inject 1.5mg into THE SKIN ONCE A WEEK °Strength: 1.5 MG/0.5ML   ° ELIQUIS 5 MG TABS tablet Take 5 mg by mouth 2 (two) times daily. 06/06/2020: Prescribed by Dr. Kowalski  ° ezetimibe (ZETIA) 10 MG tablet Take 1 tablet (10 mg total) by mouth daily.   ° furosemide (LASIX) 20 MG tablet Take 20 mg by mouth.   ° glucose blood test strip Use to check blood sugars daily as instructed   ° insulin glargine (LANTUS SOLOSTAR) 100 UNIT/ML Solostar Pen Inject 14 Units into the skin at bedtime.   ° Insulin Pen Needle 32G X 4 MM MISC Use to inject insulin daily   ° isosorbide mononitrate (IMDUR) 120 MG 24 hr tablet TAKE 1 TABLET BY MOUTH EVERY DAY (Patient taking differently: Take 120 mg by mouth daily.)   ° JARDIANCE 25 MG TABS tablet TAKE ONE TABLET o BEFORE BREAKFAST (Patient taking differently: Take 25 mg by mouth daily.)   ° Lancets (ONETOUCH DELICA PLUS LANCET33G) MISC USE  UP TO 4 TIMES DAILY AS DIRECTED   ° loperamide (IMODIUM) 2 MG capsule Take 2 mg by mouth daily as needed for diarrhea or loose stools.   ° losartan (COZAAR) 25 MG tablet Take 1 tablet (25 mg total) by mouth at bedtime.   ° metoprolol tartrate (LOPRESSOR) 100 MG tablet Take 100 mg by mouth 2 (two) times daily. 06/06/2020: Prescribed by Dr. Kowalski °  ° nitroGLYCERIN (NITROSTAT) 0.4 MG SL tablet Place 1 tablet (0.4 mg total) under the tongue every 5 (five) minutes as needed for chest pain.   ° nystatin (NYSTATIN) powder Apply 1 application topically daily. (Patient taking differently: Apply 1 application topically daily as needed.)   ° ondansetron (ZOFRAN-ODT) 8 MG disintegrating tablet Take 1 tablet (8 mg total) by mouth every 8 (eight) hours as needed. (Patient taking differently: Take 8 mg by mouth every 8 (eight) hours as needed for refractory nausea / vomiting.)   ° pantoprazole (PROTONIX) 20 MG tablet Take 1 tablet (20 mg total) by mouth 2 (two) times daily.   ° rosuvastatin (CRESTOR) 40 MG tablet Take 1 tablet (40 mg total) by mouth daily.   ° sertraline (ZOLOFT) 100 MG tablet Take 1 tablet (100 mg total) by mouth daily.   ° sertraline (ZOLOFT) 50 MG tablet TAKE ONE TABLET BY MOUTH ONCE DAILY (Patient not taking: Reported on 02/08/2021)   ° [DISCONTINUED] cetirizine (ZYRTEC)   5 MG tablet Take 2 tablets (10 mg total) by mouth daily.   ° °No facility-administered encounter medications on file as of 03/11/2021.  ° ° °Care Gaps: °COVID 19 Vaccine (4- Booster) °Ophthalmology Exam (Last Completed 06/28/2019) °Tetanus Vaccine (Last Completed 07/28/2010) °Shingrix Vaccine (2 of 2) (Last Completed 06/14/2020) °Foot Exam (Last Completed 10/27/2019) °A1C was 10.7% on 12/02/20. °Star Rating Drugs: °02/16/2021 Rosuvastatin 40 mg last filled for a 90- Day supply at [facility] °02/17/2021 Losartan 25 mg last filled for a 90-Day supply at [facility] °02/16/2021 Jardiance 25 mg last filled for a 90-Day Supply at  [facility] °02/11/2021 Trulicity 1.5 mg last filled for a 84-Day supply at [facility] °Medication Fill Gaps: °None ID ° °Reviewed chart for medication changes ahead of medication coordination call. ° °BP Readings from Last 3 Encounters:  °02/09/21 (!) 148/78  °12/25/20 114/72  °12/04/20 132/66  °  °Lab Results  °Component Value Date  ° HGBA1C 10.7 (H) 12/02/2020  °  ° °Patient obtains medications through Adherence Packaging  90 Days  ° °Last adherence delivery included:  °Pantoprazole 20 mg one tablet two times a day- breakfast, Evening meals °Losartan 25 mg one tablet daily- Bedtime °Eliquis 5 mg 1 tablet two times daily breakfast, evening meals °Jardiance 25 mg 1 tablet daily before breakfast °Rosuvastatin 40 mg 1 tablet daily - Breakfast °Sertraline 100 mg 1 tablet daily breakfast  °Metoprolol tartrate 100 mg 1 tablet 2 times daily Breakfast and Evening Meals °Nitroglycerin 0.4 mg PRN °  °Coordinated acute fill for Trulicity to be delivered 02/12/2021. ° °Patient declined medications last month  °Isosorbide mononitrate 120 mg daily breakfast - (receive 90 day supply from [facility] on 01/31/2021) °ezetimibe 10 mg 1 tablet daily breakfast - (receive 90 day supply from [facility] on 12/23/2020). °Clopidogrel 75 mg 1 tablet daily breakfast (receive 90 day supply from [facility] on 12/23/2020). °Insulin Glargine 100 unit/ML Solostar Pen- Inject 14 units into the skin at bedtime (Adequate supply, 86 day supply given on 01/13/2021) °Onetouch Ultra Test Strip (Adequate supply, 50 day supply given on 01/31/2021) °Ondansetron 8 mg PRN (Adequate supply, 7 day supply given on 01/05/2021) °Furosemide 20 mg 1 tablet daily -Breakfast, (Adequate supply 90 day supply given on 12/01/2020 from [facility]). ° °Patient is due for next adherence delivery on: 03/24/2021. °Called patient and reviewed medications and coordinated delivery. ° °I have attempted without success to contact this patient by phone three  times to do her medication coordination call. I left a Voice message for patient to return my call. ° ° °Office follow up appointment with Care management team member scheduled for : 03/24/2021  at 9:15 am. ° °Bessie Kellihan,CPA °Clinical Pharmacist Assistant °[phone]  °  ° °

## 2021-03-12 DIAGNOSIS — I480 Paroxysmal atrial fibrillation: Secondary | ICD-10-CM | POA: Diagnosis not present

## 2021-03-12 DIAGNOSIS — G459 Transient cerebral ischemic attack, unspecified: Secondary | ICD-10-CM | POA: Diagnosis not present

## 2021-03-12 DIAGNOSIS — I25118 Atherosclerotic heart disease of native coronary artery with other forms of angina pectoris: Secondary | ICD-10-CM | POA: Diagnosis not present

## 2021-03-12 DIAGNOSIS — I10 Essential (primary) hypertension: Secondary | ICD-10-CM | POA: Diagnosis not present

## 2021-03-12 DIAGNOSIS — I6523 Occlusion and stenosis of bilateral carotid arteries: Secondary | ICD-10-CM | POA: Diagnosis not present

## 2021-03-12 DIAGNOSIS — E782 Mixed hyperlipidemia: Secondary | ICD-10-CM | POA: Diagnosis not present

## 2021-03-24 ENCOUNTER — Ambulatory Visit (INDEPENDENT_AMBULATORY_CARE_PROVIDER_SITE_OTHER): Payer: Medicare Other

## 2021-03-24 DIAGNOSIS — E1142 Type 2 diabetes mellitus with diabetic polyneuropathy: Secondary | ICD-10-CM

## 2021-03-24 MED ORDER — LANTUS SOLOSTAR 100 UNIT/ML ~~LOC~~ SOPN: 18.0000 [IU] | PEN_INJECTOR | Freq: Every day | SUBCUTANEOUS | 3 refills | Status: DC

## 2021-03-24 NOTE — Patient Instructions (Signed)
Visit Information It was great speaking with you today!  Please let me know if you have any questions about our visit.   Goals Addressed             This Visit's Progress    Monitor and Manage My Blood Sugar-Diabetes Type 2   On track    Timeframe:  Long-Range Goal Priority:  High Start Date: 04/15/20      Expected End Date: 02/26/22                   Follow Up within 30 days   - Check blood sugar daily before breakfast - Check blood sugar if I feel it is too high or too low - Enter blood sugar readings and medication or insulin into daily log - Take the blood sugar log to all doctor visits - Take the blood sugar meter to all doctor visits           Patient Care Plan: General Pharmacy (Adult)     Problem Identified: Hypertension, Hyperlipidemia, Diabetes, Atrial Fibrillation, Heart Failure, Coronary Artery Disease, GERD, Asthma, Depression and Anxiety   Priority: High     Long-Range Goal: Patient-Specific Goal   Start Date: 06/06/2020  Expected End Date: 11/25/2021  This Visit's Progress: On track  Recent Progress: On track  Priority: High  Note:   Current Barriers:  Unable to achieve control of diabetes  Unable to achieve control of cholesterol Does not adhere to prescribed medication regimen  Pharmacist Clinical Goal(s):  Over the next 90 days, patient will achieve control of diabetes as evidenced by A1c less than 8% patient will achieve control of cholesterol as evidenced by LDL less than 70 adhere to prescribed medication regimen as evidenced by utilization of enhanced pharmacy services through collaboration with PharmD and provider.   Interventions: 1:1 collaboration with [name]., MD regarding development and update of comprehensive plan of care as evidenced by provider attestation and co-signature Inter-disciplinary care team collaboration (see longitudinal plan of care) Comprehensive medication review performed; medication list updated in  electronic medical record  Diabetes (A1c goal <7%) -Uncontrolled -Diagnosed 2010 -Current medications: Trulicity 1.5 mg weekly on Tuesdays   Jardiance 25 mg daily  Lantus 16 units nightly  -Medications previously tried: Glipizide, Lantus, Tradjenta, Metformin, Metformin XR, Ozempic   -Current home glucose readings: Fasting Average 157   -Reports relative hypoglycemic symptoms: light-headed, sweaty, "feeling bad." Occurred around 120s.  -Current meal patterns: trying to eat more salads, water. Limiting carbohydrates when possible. 24 hour recall:  breakfast: Yogurt + Banana  lunch: Broccoli + Grapes (handful)  dinner: Mac&Cheese  + Broccoli + BBQ Ribs  snacks: None drinks: Glucerna shakes (1x daily), water, regular ginger ale  -Current exercise: walking daily (15 minute sessions)  -Recheck A1c  -Increase Lantus to 18 units daily   Hyperlipidemia: (LDL goal < 70) -History of CABG x4 2017, 2018  -Uncontrolled -Current treatment: Rosuvastatin 40 mg daily Ezetimibe 10 mg daily   -Current antiplatelet treatment: Clopidogrel 75 mg daily  -Medications previously tried: Atorvastatin (changed to rosuvastatin during hospitalization)  -Recommended to continue current medication  Atrial Fibrillation (Goal: prevent stroke and major bleeding) -Controlled -CHADSVASC: 6 -Current treatment: Rate control: Metoprolol tartrate 100 mg twice daily  Anticoagulation: Eliquis 5 mg twice daily  -Medications previously tried: NA -Home BP and HR readings: NA  -Recommended to continue current medication  Heart Failure (Goal: manage symptoms and prevent exacerbations) -Controlled -Last ejection fraction: 55-60% (Date: June 2021) -HF  type: Diastolic -NYHA Class: III (marked limitation of activity) -AHA HF Stage: C (Heart disease and symptoms present) -Current treatment: Furosemide 20 mg daily Imdur 30 mg daily  Losartan 25 mg daily Metoprolol tartrate 100 mg twice daily  -Medications previously  tried: NA  -Current home BP/HR readings: 117/80, 108/72  -Educated on Importance of weighing daily; if you gain more than 3 pounds in one day or 5 pounds in one week, contact provider's office -Recommended to continue current medication  Depression/Anxiety (Goal: maintain stable mood) -Controlled -Current treatment: Sertraline 100 mg daily  -Medications previously tried/failed: NA -PHQ9: 0 -GAD7: 1 - Patient reports she manages her stress by talking with daughter, reading, or medidating  -Continue current medications  GERD with dysphagia (Goal: prevent heartburn or reflux symptoms) -Controlled -Current treatment  Pantoprazole 40 mg daily   -Medications previously tried: Sucralfate,  -Continue current medications  Patient Goals/Self-Care Activities Over the next 90 days, patient will:  - check glucose daily, before breakfast, document, and provide at future appointments check blood pressure weekly, document, and provide at future appointments  Follow Up Plan: Telephone follow up appointment with care management team member scheduled for:  04/20/2021 at 8:30 AM    Patient agreed to services and verbal consent obtained.   The patient verbalized understanding of instructions, educational materials, and care plan provided today and declined offer to receive copy of patient instructions, educational materials, and care plan.   Junius Argyle, PharmD, Para March, CPP  Clinical Pharmacist Practitioner  [facility] [phone]

## 2021-03-24 NOTE — Progress Notes (Signed)
Chronic Care Management Pharmacy Note  03/24/2021 Name:  Candace [name] MRN:  [id number] DOB:  Sep 26, 1950  Summary:  Patient presents for CCM follow-up. Blood sugars slightly elevated over the holiday. Sleep quality has declined recently.   Recommendations/Changes made from today's visit: -Recheck A1c  -Increase Lantus to 18 units daily   Plan: CPP Follow-up in 4 weeks   Recommended Problem List Changes:   Modify:  Type 2 diabetes mellitus with diabetic polyneuropathy, with long-term current use of insulin  Subjective: Candace [name] is an 70 y.o. year old female who is a primary patient of [name]., MD.  The CCM team was consulted for assistance with disease management and care coordination needs.    Engaged with patient by telephone for follow up visit in response to provider referral for pharmacy case management and/or care coordination services.   Consent to Services:  The patient was given information about Chronic Care Management services, agreed to services, and gave verbal consent prior to initiation of services.  Please see initial visit note for detailed documentation.   Patient Care Team: [name]., MD as PCP - General (Family Medicine) Corey Skains, MD as Consulting Physician (Cardiology) Sharlotte Alamo, DPM (Podiatry) Neldon Labella, RN as Case Manager Virgel Manifold, MD as Consulting Physician (Gastroenterology) Pa, [place], Glean Salvo, [facility] as Pharmacist (Pharmacist)  Recent office visits: 01/07/21: Patient presented to Dr. Rosanna Randy for ear pain and cough. Albuterol, doxycycline 100 mg twice daily.  12/02/20: Patient presented to Dr. Rosanna Randy for follow-up. Sertraline 100 mg.   Recent consult visits: 03/12/21: Patient presented to Dr. Nehemiah Massed (Cardiology) for follow-up. No medication changes noted.   Hospital visits: 02/08/21: Patient presented to ED for chest pain.   Objective:  Lab Results   Component Value Date   CREATININE 0.69 02/09/2021   BUN 8 02/09/2021   GFRNONAA >60 02/09/2021   GFRAA 108 02/06/2020   NA 139 02/09/2021   K 3.9 02/09/2021   CALCIUM 9.0 02/09/2021   CO2 25 02/09/2021    Lab Results  Component Value Date/Time   HGBA1C 10.7 (H) 12/02/2020 04:06 PM   HGBA1C 9.1 (H) 08/11/2020 09:32 AM   HGBA1C 9.2 07/10/2019 12:00 AM   HGBA1C 8.3 (H) 07/14/2013 05:42 AM   MICROALBUR 50 08/11/2020 08:51 AM    Last diabetic Eye exam:  Lab Results  Component Value Date/Time   HMDIABEYEEXA No Retinopathy 06/28/2019 12:00 AM    Last diabetic Foot exam: No results found for: HMDIABFOOTEX   Lab Results  Component Value Date   CHOL 164 08/11/2020   HDL 57 08/11/2020   LDLCALC 90 08/11/2020   TRIG 94 08/11/2020   CHOLHDL 2.9 08/11/2020    Hepatic Function Latest Ref Rng & Units 02/09/2021 02/08/2021 12/02/2020  Total Protein 6.5 - 8.1 g/dL 6.1(L) 6.2(L) 6.2  Albumin 3.5 - 5.0 g/dL 3.4(L) 3.6 4.0  AST 15 - 41 U/L _0 ALT 0 - 44 U/L _1 Alk Phosphatase 38 - 126 U/L 76 80 108  Total Bilirubin 0.3 - 1.2 mg/dL 0.4 0.5 0.2  Bilirubin, Direct 0.0 - 0.2 mg/dL - 0.1 -    Lab Results  Component Value Date/Time   TSH 1.180 01/03/2019 09:57 AM   TSH 1.262 09/14/2018 05:04 PM   TSH 1.661 12/23/2016 07:03 PM   TSH 0.818 10/01/2016 09:35 AM    CBC Latest Ref Rng & Units 02/09/2021 02/08/2021 12/04/2020  WBC 4.0 -  10.5 K/uL 6.0 6.7 6.1  Hemoglobin 12.0 - 15.0 g/dL 12.1 13.0 12.3  Hematocrit 36.0 - 46.0 % 38.7 40.8 36.9  Platelets 150 - 400 K/uL 180 191 185    Lab Results  Component Value Date/Time   VD25OH 12.2 (L) 10/01/2016 09:35 AM    Clinical ASCVD: Yes  The ASCVD Risk score (Arnett DK, et al., 2019) failed to calculate for the following reasons:   The patient has a prior MI or stroke diagnosis    Depression screen [facility] : [facility] 2/9 10/29/2020 08/05/2020 04/15/2020  Decreased Interest 0 1 0  Down, Depressed, Hopeless 0 1 0  PHQ - 2 Score 0 2 0  Altered  sleeping 1 1 -  Tired, decreased energy 1 1 -  Change in appetite 0 1 -  Feeling bad or failure about yourself  0 0 -  Trouble concentrating 0 0 -  Moving slowly or fidgety/restless 1 0 -  Suicidal thoughts 0 0 -  PHQ-9 Score 3 5 -  Difficult doing work/chores Not difficult at all Not difficult at all -  Some recent data might be hidden    Social History   Tobacco Use  Smoking Status Former   Packs/day: 0.50   Types: Cigarettes   Quit date: 10/07/2001   Years since quitting: 19.4  Smokeless Tobacco Never   BP Readings from Last 3 Encounters:  02/09/21 (!) 148/78  12/25/20 114/72  12/04/20 132/66   Pulse Readings from Last 3 Encounters:  02/09/21 74  12/25/20 88  12/04/20 75   Wt Readings from Last 3 Encounters:  02/09/21 164 lb 8 oz (74.6 kg)  12/25/20 164 lb (74.4 kg)  10/04/20 167 lb (75.8 kg)    Assessment/Interventions: Review of patient past medical history, allergies, medications, health status, including review of consultants reports, laboratory and other test data, was performed as part of comprehensive evaluation and provision of chronic care management services.   SDOH:  (Social Determinants of Health) assessments and interventions performed: Yes SDOH Interventions    Flowsheet Row Most Recent Value  SDOH Interventions   Financial Strain Interventions Intervention Not Indicated      CCM Care Plan  Allergies  Allergen Reactions   Lisinopril Cough   Penicillins Swelling, Rash and Other (See Comments)    Did it involve swelling of the face/tongue/throat, SOB, or low BP? Yes Did it involve sudden or severe rash/hives, skin peeling, or any reaction on the inside of your mouth or nose? Yes Did you need to seek medical attention at a hospital or doctor's office? Yes When did it last happen?      15 years If all above answers are NO, may proceed with cephalosporin use.     Medications Reviewed Today     Reviewed by Neldon Labella, RN (Registered  Nurse) on 02/17/21 at 1013  Med List Status: <None>   Medication Order Taking? Sig Documenting Provider Last Dose Status Informant  acetaminophen (TYLENOL) 325 MG tablet [id number] No Take 650 mg by mouth every 6 (six) hours as needed for moderate pain or headache.  [provider] Past Month Active Self  albuterol (VENTOLIN HFA) 108 (90 Base) MCG/ACT inhaler [id number] No Inhale 2 puffs into the lungs every 6 (six) hours as needed for wheezing or shortness of breath. [name]., MD Past Month Active Self  bismuth subsalicylate (PEPTO BISMOL) 262 MG chewable tablet [id number] No Chew 524 mg by mouth daily as needed for diarrhea or loose stools or indigestion. [provider] Past Month Active Self  Discontinued 03/06/19 1830   clopidogrel (PLAVIX) 75 MG tablet [id number] No Take 1 tablet (75 mg total) by mouth daily. [name]., MD 02/08/2021 Active Self  ELIQUIS 5 MG TABS tablet [id number] No Take 5 mg by mouth 2 (two) times daily. [provider] 02/08/2021 0730 Active Self           Med Note Michaelle Birks, Terilyn Sano A   Fri Jun 06, 2020 11:56 AM) Prescribed by Dr. Nehemiah Massed  ezetimibe (ZETIA) 10 MG tablet [id number] No Take 1 tablet (10 mg total) by mouth daily. [name]., MD 02/08/2021 Active Self  furosemide (LASIX) 20 MG tablet [id number] No Take 20 mg by mouth. [provider] 02/08/2021 Active Self  glucose blood test strip [id number] No Use to check blood sugars daily as instructed [name]., MD Taking Active Self  insulin glargine (LANTUS SOLOSTAR) 100 UNIT/ML Solostar Pen [id number] No Inject 14 Units into the skin at bedtime. [name]., MD 02/07/2021 Active Self  Insulin Pen Needle 32G X 4 MM MISC [id number] No Use to inject insulin daily [name]., MD Taking Active Self  isosorbide mononitrate (IMDUR) 120 MG 24 hr tablet [id number] No TAKE 1 TABLET BY MOUTH EVERY DAY  Patient taking  differently: Take 120 mg by mouth daily.   [name]., MD 02/08/2021 Active Self  JARDIANCE 25 MG TABS tablet [id number] No TAKE ONE TABLET o BEFORE BREAKFAST  Patient taking differently: Take 25 mg by mouth daily.   [name]., MD 02/08/2021 Active Self  Lancets (ONETOUCH DELICA PLUS [id number]) MISC [id number] No USE UP TO 4 TIMES DAILY AS DIRECTED [name]., MD Taking Active Self  loperamide (IMODIUM) 2 MG capsule [id number] No Take 2 mg by mouth daily as needed for diarrhea or loose stools. [provider] Past Week Active Self  losartan (COZAAR) 25 MG tablet [id number]  Take 1 tablet (25 mg total) by mouth at bedtime. [name]., MD  Active   metoprolol tartrate (LOPRESSOR) 100 MG tablet [id number] No Take 100 mg by mouth 2 (two) times daily. [provider] 02/08/2021 0730 Active Self           Med Note Michaelle Birks, Cathe Mons A   Fri Jun 06, 2020 11:56 AM) Prescribed by Dr. Nehemiah Massed   nitroGLYCERIN (NITROSTAT) 0.4 MG SL tablet [id number] No Place 1 tablet (0.4 mg total) under the tongue every 5 (five) minutes as needed for chest pain. Bettey Costa, MD 02/08/2021 Active Self           Med Note Johny Drilling Mar 27, 2018  2:39 PM)    nystatin (NYSTATIN) powder [id number] No Apply 1 application topically daily.  Patient taking differently: Apply 1 application topically daily as needed.   Rubie Maid, MD Past Month Active Self  ondansetron (ZOFRAN-ODT) 8 MG disintegrating tablet [id number] No Take 1 tablet (8 mg total) by mouth every 8 (eight) hours as needed.  Patient taking differently: Take 8 mg by mouth every 8 (eight) hours as needed for refractory nausea / vomiting.   [name]., MD 02/08/2021 Active Self  pantoprazole (PROTONIX) 20 MG tablet [id number] No Take 1 tablet (20 mg total) by mouth 2 (two) times daily. [name]., MD 02/08/2021 Active Self  rosuvastatin (CRESTOR) 40 MG tablet [id number] No  Take 1 tablet (40 mg total) by mouth daily. Eulas Post  Brooke Bonito., MD 02/08/2021 Active Self  sertraline (ZOLOFT) 100 MG tablet [id number] No Take 1 tablet (100 mg total) by mouth daily. [name]., MD 02/08/2021 Active Self  sertraline (ZOLOFT) 50 MG tablet [id number] No TAKE ONE TABLET BY MOUTH ONCE DAILY  Patient not taking: Reported on 02/08/2021   [name]., MD Not Taking Active Self  TRULICITY 1.5 FO/2.7XA [facility] [id number] No Inject 1.17m into THE SKIN ONCE A WEEK  Patient taking differently: Inject 1.5 mg as directed once a week.   GJerrol Banana, MD 02/05/2021 Active Self            Patient Active Problem List   Diagnosis Date Noted   Vertigo    Unsteady gait    Acute bronchitis 03/07/2019   Esophageal dysphagia    Stomach irritation    Gastric polyp    Esophageal lump    Hx of colonic polyp    Polyp of colon    Diverticulosis of large intestine without diverticulitis    PSVT (paroxysmal supraventricular tachycardia) (HCC) 10/03/2018   Abnormal ECG 07/05/2018   Tachycardia 03/27/2018   Pain in limb 02/28/2018   Chronic diastolic CHF (congestive heart failure) ([place] 11/03/2017   TIA (transient ischemic attack) 11/03/2017   COPD suggested by initial evaluation ([place] 08/23/2017   Acute and chronic respiratory failure with hypoxia ([facility] 08/23/2017   Postoperative state 08/15/2017   Incidental pulmonary nodule 08/01/2017   Non-ST elevation myocardial infarction (NSTEMI), subendocardial infarction, subsequent episode of care ([facility] 06/01/2017   B12 deficiency 12/01/2016   Vitamin D deficiency 11/04/2016   Depression with anxiety 09/30/2016   AF (paroxysmal atrial fibrillation) ([facility] 09/30/2016   Resting tremor 09/30/2016   Mild obstructive sleep apnea 09/30/2016   Headache 08/18/2016   Unstable angina ([place] 08/03/2016   Type 2 diabetes mellitus with complication, without long-term current use of insulin ([place] 08/03/2016   Chronic daily  headache 05/05/2016   Asthma 11/11/2015   S/P CABG x 4 11/10/2015   Coronary artery disease 11/07/2015   Carotid artery narrowing 02/08/2014   Chest pain 02/08/2014   Mixed hyperlipidemia 02/08/2014   Bilateral carotid artery stenosis 02/08/2014   Atypical chest pain 02/07/2014   Essential hypertension 02/07/2014   Awareness of heartbeats 02/07/2014   D (diarrhea) 10/05/2013   Gastroesophageal reflux disease 10/05/2013    Immunization History  Administered Date(s) Administered   Fluad Quad(high Dose 65+) 12/28/2018, 12/25/2020   Influenza, High Dose Seasonal PF 12/24/2016, 12/08/2017   Influenza,inj,Quad PF,6+ Mos 12/19/2015, 01/18/2020   Influenza-Unspecified 03/06/2015   Moderna Sars-Covid-2 Vaccination 04/14/2019, 05/12/2019, 01/28/2020   Pneumococcal Conjugate-13 03/16/2018   Pneumococcal Polysaccharide-23 12/24/2016   Tdap 07/28/2010   Zoster Recombinat (Shingrix) 06/14/2020    Conditions to be addressed/monitored:  Hypertension, Hyperlipidemia, Diabetes, Atrial Fibrillation, Heart Failure, Coronary Artery Disease, GERD, Asthma, Depression and Anxiety  Care Plan : General Pharmacy (Adult)  Updates made by FGermaine Pomfret RPH since 03/24/2021 12:00 AM     Problem: Hypertension, Hyperlipidemia, Diabetes, Atrial Fibrillation, Heart Failure, Coronary Artery Disease, GERD, Asthma, Depression and Anxiety   Priority: High     Long-Range Goal: Patient-Specific Goal   Start Date: 06/06/2020  Expected End Date: 11/25/2021  This Visit's Progress: On track  Recent Progress: On track  Priority: High  Note:   Current Barriers:  Unable to achieve control of diabetes  Unable to achieve control of cholesterol Does not adhere to prescribed medication regimen  Pharmacist Clinical Goal(s):  Over the next 90 days,  patient will achieve control of diabetes as evidenced by A1c less than 8% patient will achieve control of cholesterol as evidenced by LDL less than 70 adhere to  prescribed medication regimen as evidenced by utilization of enhanced pharmacy services through collaboration with PharmD and provider.   Interventions: 1:1 collaboration with [name]., MD regarding development and update of comprehensive plan of care as evidenced by provider attestation and co-signature Inter-disciplinary care team collaboration (see longitudinal plan of care) Comprehensive medication review performed; medication list updated in electronic medical record  Diabetes (A1c goal <7%) -Uncontrolled -Diagnosed 2010 -Current medications: Trulicity 1.5 mg weekly on Tuesdays   Jardiance 25 mg daily  Lantus 16 units nightly  -Medications previously tried: Glipizide, Lantus, Tradjenta, Metformin, Metformin XR, Ozempic   -Current home glucose readings: Fasting Average 157   -Reports relative hypoglycemic symptoms: light-headed, sweaty, "feeling bad." Occurred around 120s.  -Current meal patterns: trying to eat more salads, water. Limiting carbohydrates when possible. 24 hour recall:  breakfast: Yogurt + Banana  lunch: Broccoli + Grapes (handful)  dinner: Mac&Cheese  + Broccoli + BBQ Ribs  snacks: None drinks: Glucerna shakes (1x daily), water, regular ginger ale  -Current exercise: walking daily (15 minute sessions)  -Recheck A1c  -Increase Lantus to 18 units daily   Hyperlipidemia: (LDL goal < 70) -History of CABG x4 2017, 2018  -Uncontrolled -Current treatment: Rosuvastatin 40 mg daily Ezetimibe 10 mg daily   -Current antiplatelet treatment: Clopidogrel 75 mg daily  -Medications previously tried: Atorvastatin (changed to rosuvastatin during hospitalization)  -Recommended to continue current medication  Atrial Fibrillation (Goal: prevent stroke and major bleeding) -Controlled -CHADSVASC: 6 -Current treatment: Rate control: Metoprolol tartrate 100 mg twice daily  Anticoagulation: Eliquis 5 mg twice daily  -Medications previously tried: NA -Home BP and  HR readings: NA  -Recommended to continue current medication  Heart Failure (Goal: manage symptoms and prevent exacerbations) -Controlled -Last ejection fraction: 55-60% (Date: June 2021) -HF type: Diastolic -NYHA Class: III (marked limitation of activity) -AHA HF Stage: C (Heart disease and symptoms present) -Current treatment: Furosemide 20 mg daily Imdur 30 mg daily  Losartan 25 mg daily Metoprolol tartrate 100 mg twice daily  -Medications previously tried: NA  -Current home BP/HR readings: 117/80, 108/72  -Educated on Importance of weighing daily; if you gain more than 3 pounds in one day or 5 pounds in one week, contact provider's office -Recommended to continue current medication  Depression/Anxiety (Goal: maintain stable mood) -Controlled -Current treatment: Sertraline 100 mg daily  -Medications previously tried/failed: NA -PHQ9: 0 -GAD7: 1 - Patient reports she manages her stress by talking with daughter, reading, or medidating  -Continue current medications  GERD with dysphagia (Goal: prevent heartburn or reflux symptoms) -Controlled -Current treatment  Pantoprazole 40 mg daily   -Medications previously tried: Sucralfate,  -Continue current medications  Patient Goals/Self-Care Activities Over the next 90 days, patient will:  - check glucose daily, before breakfast, document, and provide at future appointments check blood pressure weekly, document, and provide at future appointments  Follow Up Plan: Telephone follow up appointment with care management team member scheduled for:  04/20/2021 at 8:30 AM      Medication Assistance: None required.  Patient affirms current coverage meets needs.  Patient's preferred pharmacy is:  [facility] - [place], [place] - [address] [place] [address] Phone: [phone] Fax: [phone]  [id number] [place]  [address] [place] [address] Phone: [phone] Fax: [phone]  [id number] [place] [address] [id number]: [phone]: [phone] Patient decided to: Utilize [facility] for medication synchronization, packaging and delivery   Care Plan and Follow Up Patient Decision:  Patient agrees to Care Plan and Follow-up.  Plan: Telephone follow up appointment with care management team member scheduled for:  04/20/2021 at 8:30 AM  AJunius Argyle PharmD, BPara March [phone]

## 2021-03-26 DIAGNOSIS — Z794 Long term (current) use of insulin: Secondary | ICD-10-CM | POA: Diagnosis not present

## 2021-03-26 DIAGNOSIS — E1142 Type 2 diabetes mellitus with diabetic polyneuropathy: Secondary | ICD-10-CM | POA: Diagnosis not present

## 2021-03-27 LAB — HEMOGLOBIN A1C
Est. average glucose Bld gHb Est-mCnc: 197 mg/dL
Hgb A1c MFr Bld: 8.5 % — ABNORMAL HIGH (ref 4.8–5.6)

## 2021-03-28 DIAGNOSIS — Z794 Long term (current) use of insulin: Secondary | ICD-10-CM | POA: Diagnosis not present

## 2021-03-28 DIAGNOSIS — E785 Hyperlipidemia, unspecified: Secondary | ICD-10-CM | POA: Diagnosis not present

## 2021-03-28 DIAGNOSIS — E1142 Type 2 diabetes mellitus with diabetic polyneuropathy: Secondary | ICD-10-CM

## 2021-04-09 ENCOUNTER — Telehealth: Payer: Self-pay | Admitting: Family Medicine

## 2021-04-09 DIAGNOSIS — E1165 Type 2 diabetes mellitus with hyperglycemia: Secondary | ICD-10-CM

## 2021-04-09 MED ORDER — TRULICITY 1.5 MG/0.5ML ~~LOC~~ SOAJ: 1.5000 mg | SUBCUTANEOUS | 5 refills | Status: DC

## 2021-04-09 NOTE — Telephone Encounter (Signed)
Done. Thanks.   Junius Argyle, PharmD, Para March, CPP  Clinical Pharmacist Practitioner  [facility] [phone]

## 2021-04-09 NOTE — Telephone Encounter (Signed)
Pt called and stated she needed a refill for Dulaglutide (TRULICITY) 1.5 OO/8.7NZ SOPN and Insulin Pen Needle 32G X 4 MM sent to [facility]  She stated she is to contact Cristie Hem first / please advise

## 2021-04-16 ENCOUNTER — Telehealth: Payer: Self-pay

## 2021-04-16 NOTE — Telephone Encounter (Signed)
Copied from [place] [phone]. Topic: Appointment Scheduling - Scheduling Inquiry for Clinic >> Apr 16, 2021  8:20 AM Scherrie Gerlach wrote: Reason for CRM: pt would like to reschedule her AWV.  It was cancelled for 04/08/21 and she said no one told her.

## 2021-04-16 NOTE — Telephone Encounter (Signed)
Please advise 

## 2021-04-16 NOTE — Telephone Encounter (Signed)
Noted  

## 2021-04-16 NOTE — Telephone Encounter (Signed)
Copied from [place] [id number]. Topic: General - Call Back - No Documentation >> Apr 16, 2021  8:18 AM Scherrie Gerlach wrote: Reason for CRM: pt wants to know what her last A1C was that was done 12/29. Please call back.

## 2021-04-16 NOTE — Telephone Encounter (Signed)
Patient advised that her A1C that was done 03/26/21 was 8.5.

## 2021-04-17 ENCOUNTER — Telehealth: Payer: Self-pay

## 2021-04-17 NOTE — Progress Notes (Signed)
Chronic Care Management APPOINTMENT REMINDER  HEDI BARKAN was reminded to have all medications, supplements and any blood glucose and blood pressure readings available for review with Junius Argyle, Pharm. D, at her telephone visit on 04/20/2021 at 8:30 am.  Patient Confirmed Appointment.  [place] Pharmacist Assistant [phone]

## 2021-04-20 ENCOUNTER — Ambulatory Visit: Payer: 59

## 2021-04-20 ENCOUNTER — Ambulatory Visit (INDEPENDENT_AMBULATORY_CARE_PROVIDER_SITE_OTHER): Payer: 59

## 2021-04-20 DIAGNOSIS — I5032 Chronic diastolic (congestive) heart failure: Secondary | ICD-10-CM

## 2021-04-20 DIAGNOSIS — I480 Paroxysmal atrial fibrillation: Secondary | ICD-10-CM

## 2021-04-20 DIAGNOSIS — I10 Essential (primary) hypertension: Secondary | ICD-10-CM

## 2021-04-20 DIAGNOSIS — E1142 Type 2 diabetes mellitus with diabetic polyneuropathy: Secondary | ICD-10-CM

## 2021-04-20 DIAGNOSIS — E782 Mixed hyperlipidemia: Secondary | ICD-10-CM

## 2021-04-20 NOTE — Patient Instructions (Signed)
Visit Information It was great speaking with you today!  Please let me know if you have any questions about our visit.   Goals Addressed             This Visit's Progress    Monitor and Manage My Blood Sugar-Diabetes Type 2   On track    Timeframe:  Long-Range Goal Priority:  High Start Date: 04/15/20      Expected End Date: 02/26/22                   Follow Up within 30 days   - Check blood sugar daily before breakfast - Check blood sugar if I feel it is too high or too low - Enter blood sugar readings and medication or insulin into daily log - Take the blood sugar log to all doctor visits - Take the blood sugar meter to all doctor visits          Patient Care Plan: General Pharmacy (Adult)     Problem Identified: Hypertension, Hyperlipidemia, Diabetes, Atrial Fibrillation, Heart Failure, Coronary Artery Disease, GERD, Asthma, Depression and Anxiety   Priority: High     Long-Range Goal: Patient-Specific Goal   Start Date: 06/06/2020  Expected End Date: 11/25/2021  This Visit's Progress: On track  Recent Progress: On track  Priority: High  Note:   Current Barriers:  Unable to achieve control of diabetes  Unable to achieve control of cholesterol Does not adhere to prescribed medication regimen  Pharmacist Clinical Goal(s):  Over the next 90 days, patient will achieve control of diabetes as evidenced by A1c less than 8% patient will achieve control of cholesterol as evidenced by LDL less than 70 adhere to prescribed medication regimen as evidenced by utilization of enhanced pharmacy services through collaboration with PharmD and provider.   Interventions: 1:1 collaboration with [name]., MD regarding development and update of comprehensive plan of care as evidenced by provider attestation and co-signature Inter-disciplinary care team collaboration (see longitudinal plan of care) Comprehensive medication review performed; medication list updated in  electronic medical record  Diabetes (A1c goal <7%) -Uncontrolled -Diagnosed 2010 -Current medications:  Trulicity 1.5 mg weekly on Tuesdays: Appropriate, Query effective   Jardiance 25 mg daily: Appropriate, Query effective   Lantus 18 units nightly (0.24 u/kg): Appropriate, Query effective   -Medications previously tried: Glipizide, Lantus, Tradjenta, Metformin, Metformin XR, Ozempic   -Current home glucose readings:  Fasting  13-Jan 147  14-Jan 163  15-Jan 157  16-Jan 146  17-Jan 134  18-Jan 122  19-Jan 154  20-Jan 143  21-Jan 139  22-Jan 137  23-Jan 160  Average 146   -Reports relative hypoglycemic symptoms: Nervous, Jittery. Occurs when blood sugars fall below 120s.  -Patient does endorse rare episodes of nausea. Unsure if related to Trulicity use.  -Current meal patterns: Light meals, mainly salads, yogurt.  -Current exercise: Less exercise due to the weather. Walking 3-4 times weekly (15-20 minutes)   -Given relative hypoglycemia symptoms, will defer intensifying insulin at this time and focus on lifestyle changes.  -Continue current medications   Hyperlipidemia: (LDL goal < 70) -History of CABG x4 2017, 2018  -Uncontrolled -Current treatment: Rosuvastatin 40 mg daily: Appropriate, Query effective  Ezetimibe 10 mg daily: Appropriate, Query effective   -Current antiplatelet treatment: Clopidogrel 75 mg daily  -Medications previously tried: Atorvastatin (changed to rosuvastatin during hospitalization)  -Recommended to continue current medication -Recheck fasting lipid panel at PCP follow-up.   Atrial Fibrillation (Goal: prevent stroke  and major bleeding) -Controlled -CHADSVASC: 6 -Current treatment: Rate control: Metoprolol tartrate 100 mg twice daily: Appropriate, Query effective  Anticoagulation: Eliquis 5 mg twice daily: Appropriate, Query effective  -Medications previously tried: NA -Denies symptoms of A-fib -Recommended to continue current  medication  Heart Failure (Goal: manage symptoms and prevent exacerbations) -Controlled -Last ejection fraction: 55-60% (Date: June 2021) -HF type: Diastolic -NYHA Class: III (marked limitation of activity) -AHA HF Stage: C (Heart disease and symptoms present) -Current treatment: Furosemide 20 mg daily: Appropriate, Query effective Imdur 30 mg daily: Appropriate, Query effective  Losartan 25 mg daily: Appropriate, Query effective Metoprolol tartrate 100 mg twice daily: Appropriate, Query effective  -Medications previously tried: NA  -Current home BP/HR readings: 128/70  -Educated on Importance of weighing daily; if you gain more than 3 pounds in one day or 5 pounds in one week, contact provider's office -Recommended to continue current medication  Depression/Anxiety (Goal: maintain stable mood) -Controlled -Current treatment: Sertraline 100 mg daily  -Medications previously tried/failed: NA -PHQ9: 0 -GAD7: 1 - Patient reports she manages her stress by talking with daughter, reading, or medidating  -Continue current medications  GERD with dysphagia (Goal: prevent heartburn or reflux symptoms) -Controlled -Current treatment  Pantoprazole 40 mg daily   -Medications previously tried: Sucralfate,  -Continue current medications  Patient Goals/Self-Care Activities Over the next 90 days, patient will:  - check glucose daily, before breakfast, document, and provide at future appointments check blood pressure weekly, document, and provide at future appointments  Follow Up Plan: Telephone follow up appointment with care management team member scheduled for:  06/12/2021 at 8:30 AM    Patient agreed to services and verbal consent obtained.   The patient verbalized understanding of instructions, educational materials, and care plan provided today and declined offer to receive copy of patient instructions, educational materials, and care plan.   Junius Argyle, PharmD, [place], CPP   Clinical Pharmacist Practitioner  [facility] [phone]

## 2021-04-20 NOTE — Chronic Care Management (AMB) (Signed)
Chronic Care Management   CCM RN Visit Note  04/20/2021 Name: Candace [name] MRN: [id number] DOB: 03/10/1951  Subjective: Candace [name] is a 71 y.o. year old female who is a primary care patient of [name]., MD. The care management team was consulted for assistance with disease management and care coordination needs.    Engaged with patient by telephone for follow up visit in response to provider referral for case management and care coordination services.   Consent to Services:  The patient was given information about Chronic Care Management services, agreed to services, and gave verbal consent prior to initiation of services.  Please see initial visit note for detailed documentation.    Assessment: Review of patient past medical history, allergies, medications, health status, including review of consultants reports, laboratory and other test data, was performed as part of comprehensive evaluation and provision of chronic care management services.   SDOH (Social Determinants of Health) assessments and interventions performed: No  CCM Care Plan  Allergies  Allergen Reactions   Lisinopril Cough   Penicillins Swelling, Rash and Other (See Comments)    Did it involve swelling of the face/tongue/throat, SOB, or low BP? Yes Did it involve sudden or severe rash/hives, skin peeling, or any reaction on the inside of your mouth or nose? Yes Did you need to seek medical attention at a hospital or doctor's office? Yes When did it last happen?      15 years If all above answers are "NO", may proceed with cephalosporin use.     Outpatient Encounter Medications as of 04/20/2021  Medication Sig Note   acetaminophen (TYLENOL) 325 MG tablet Take 650 mg by mouth every 6 (six) hours as needed for moderate pain or headache.     albuterol (VENTOLIN HFA) 108 (90 Base) MCG/ACT inhaler Inhale 2 puffs into the lungs every 6 (six) hours as needed for wheezing or shortness of breath.     bismuth subsalicylate (PEPTO BISMOL) 262 MG chewable tablet Chew 524 mg by mouth daily as needed for diarrhea or loose stools or indigestion.    Blood Glucose Monitoring Suppl (ONETOUCH VERIO) w/Device KIT Use daily to check blood sugar    clopidogrel (PLAVIX) 75 MG tablet Take 1 tablet (75 mg total) by mouth daily.    Dulaglutide (TRULICITY) 1.5 KY/7.0WC SOPN Inject 1.5 mg into the skin once a week. Inject 1.63m into THE SKIN ONCE A WEEK Strength: 1.5 MG/0.5ML    ELIQUIS 5 MG TABS tablet Take 5 mg by mouth 2 (two) times daily. 06/06/2020: Prescribed by Dr. KNehemiah Massed  ezetimibe (ZETIA) 10 MG tablet Take 1 tablet (10 mg total) by mouth daily.    furosemide (LASIX) 20 MG tablet Take 20 mg by mouth daily.    glucose blood test strip Use to check blood sugars daily as instructed    insulin glargine (LANTUS SOLOSTAR) 100 UNIT/ML Solostar Pen Inject 18 Units into the skin at bedtime.    Insulin Pen Needle 32G X 4 MM MISC Use to inject insulin daily    isosorbide mononitrate (IMDUR) 120 MG 24 hr tablet TAKE 1 TABLET BY MOUTH EVERY DAY (Patient taking differently: Take 120 mg by mouth daily.)    JARDIANCE 25 MG TABS tablet TAKE ONE TABLET o BEFORE BREAKFAST (Patient taking differently: Take 25 mg by mouth daily.)    Lancets (ONETOUCH DELICA PLUS [id number] MISC USE UP TO 4 TIMES DAILY AS DIRECTED    loperamide (IMODIUM) 2 MG capsule Take 2 mg  by mouth daily as needed for diarrhea or loose stools.    losartan (COZAAR) 25 MG tablet Take 1 tablet (25 mg total) by mouth at bedtime.    metoprolol tartrate (LOPRESSOR) 100 MG tablet Take 100 mg by mouth 2 (two) times daily. 06/06/2020: Prescribed by Dr. Nehemiah Massed    nitroGLYCERIN (NITROSTAT) 0.4 MG SL tablet Place 1 tablet (0.4 mg total) under the tongue every 5 (five) minutes as needed for chest pain.    nystatin (NYSTATIN) powder Apply 1 application topically daily. (Patient taking differently: Apply 1 application topically daily as needed.)    ondansetron  (ZOFRAN-ODT) 8 MG disintegrating tablet Take 1 tablet (8 mg total) by mouth every 8 (eight) hours as needed. (Patient taking differently: Take 8 mg by mouth every 8 (eight) hours as needed for refractory nausea / vomiting.)    pantoprazole (PROTONIX) 20 MG tablet Take 1 tablet (20 mg total) by mouth 2 (two) times daily.    rosuvastatin (CRESTOR) 40 MG tablet Take 1 tablet (40 mg total) by mouth daily.    sertraline (ZOLOFT) 100 MG tablet Take 1 tablet (100 mg total) by mouth daily.    [DISCONTINUED] cetirizine (ZYRTEC) 5 MG tablet Take 2 tablets (10 mg total) by mouth daily.    No facility-administered encounter medications on file as of 04/20/2021.    Patient Active Problem List   Diagnosis Date Noted   Vertigo    Unsteady gait    Acute bronchitis 03/07/2019   Esophageal dysphagia    Stomach irritation    Gastric polyp    Esophageal lump    Hx of colonic polyp    Polyp of colon    Diverticulosis of large intestine without diverticulitis    PSVT (paroxysmal supraventricular tachycardia) ([place]) 10/03/2018   Abnormal ECG 07/05/2018   Tachycardia 03/27/2018   Pain in limb 02/28/2018   Chronic diastolic CHF (congestive heart failure) ([place]) 11/03/2017   TIA (transient ischemic attack) 11/03/2017   COPD suggested by initial evaluation ([place]) 08/23/2017   Acute and chronic respiratory failure with hypoxia ([place]) 08/23/2017   Postoperative state 08/15/2017   Incidental pulmonary nodule 08/01/2017   Non-ST elevation myocardial infarction (NSTEMI), subendocardial infarction, subsequent episode of care ([place]) 06/01/2017   B12 deficiency 12/01/2016   Vitamin D deficiency 11/04/2016   Depression with anxiety 09/30/2016   AF (paroxysmal atrial fibrillation) ([place]) 09/30/2016   Resting tremor 09/30/2016   Mild obstructive sleep apnea 09/30/2016   Headache 08/18/2016   Unstable angina ([place]) 08/03/2016   Type 2 diabetes mellitus with diabetic polyneuropathy, with long-term current use of insulin  ([place]) 08/03/2016   Chronic daily headache 05/05/2016   Asthma 11/11/2015   S/P CABG x 4 11/10/2015   Coronary artery disease 11/07/2015   Carotid artery narrowing 02/08/2014   Chest pain 02/08/2014   Mixed hyperlipidemia 02/08/2014   Bilateral carotid artery stenosis 02/08/2014   Atypical chest pain 02/07/2014   Essential hypertension 02/07/2014   Awareness of heartbeats 02/07/2014   D (diarrhea) 10/05/2013   Gastroesophageal reflux disease 10/05/2013      Patient Care Plan: RN Care Management Plan of Care     Problem Identified: DM, HF, HTN, A-Fib and DM      Long-Range Goal: Disease Progression Prevented or Minimized   Start Date: 02/10/2021  Expected End Date: 05/11/2021  Priority: High  Note:   Current Barriers:  Chronic Disease Management support and education needs related to Atrial Fibrillation, CHF, HTN, and DMII  RNCM Clinical Goal(s):  Patient will continue  to work with RN Care Manager to address care management and care coordination needs related to  Atrial Fibrillation, CHF, HTN, and DMII through collaboration with RN Care manager, provider, and care team.   Interventions: 1:1 collaboration with primary care provider regarding development and update of comprehensive plan of care as evidenced by provider attestation and co-signature Inter-disciplinary care team collaboration (see longitudinal plan of care) Evaluation of current treatment plan related to  self management and patient's adherence to plan as established by provider  Hypertension Interventions: Reviewed plan for hypertension management. Reports excellent compliance with medications. Reviewed importance of monitoring BP and recording readings. Reviewed indications for notifying a provider. Attending Cardiology evaluation as scheduled in December. No changes to medications. Reviewed importance of maintaining a cardiac prudent/heart healthy diet. Discussed complications of uncontrolled blood pressure.   Reviewed s/sx of heart attack, stroke and worsening symptoms that require immediate medical attention.    Diabetes Interventions: Assessed patient's understanding of A1c goal: <7% Lab Results  Component Value Date   HGBA1C 8.5 (H) 03/26/2021   Lab Results  Component Value Date   HGBA1C 10.7 (H) 12/02/2020  Reviewed medications and plan for diabetes management. Reports taking oral medications and administering insulin injections as prescribed. Discussed importance of monitoring blood glucose levels consistently. Reviewed s/sx hypoglycemia and hyperglycemia. Reports a good appetite and attempting to improve compliance with an ADA/modified carb diet. Recalls a few low evening readings over the past few weeks. Advised to monitor blood glucose level prior to administering evening insulin to prevent risk of hypoglycemia during the night.  Discussed importance of completing diabetic eye and foot exams as recommended. Reports current exams are up to date. Reviewed available diabetic resources. Declined need for additional education resources. Her A1C has improved from 10.7 to 8.5%.   Heart Failure Interventions: Reviewed current plan for CHF management. Reports managing well. Reports weights have been within parameters. Denies increased abdominal or lower extremity edema. Denies episodes of chest pain or shortness of breath. Reports tolerating activities well.  Reviewed s/sx of fluid overload and indications for notifying a provider. Reviewed worsening s/sx related to CHF exacerbation and indications for seeking immediate medical attention.   AFIB Interventions:  Reviewed increased risk of stroke due to Afib. Advised to continue taking medications as prescribed. Completed Cardiology follow up as scheduled in December. Will follow up again in six months Reviewed s/sx that require immediate medical attention.     Patient Goals/Self-Care Activities: Patient will self administer medications as  prescribed Patient will attend all scheduled provider appointments Patient will call pharmacy for medication refills Patient will continue to perform ADL's independently Patient will continue to perform IADL's independently Patient will call provider office for new concerns or questions   Follow Up Plan:   Will follow up within the next month       PLAN A member of the care management team will follow up within the next month.   [name] Care Management [facility] [phone]

## 2021-04-20 NOTE — Progress Notes (Signed)
Chronic Care Management Pharmacy Note  04/20/2021 Name:  Candace [name] MRN:  [id number] DOB:  1950/05/20  Summary:  Patient presents for CCM follow-up. Patient blood sugars are slightly above goal, but patient continues to experience relative hypoglycemia symptoms.   Recommendations/Changes made from today's visit: -Continue current medications  -Recheck A1c, fasting lipid panel at PCP follow-up.   Plan: CPP Follow-up in 2 months   Recommended Problem List Changes:   Modify:  Type 2 diabetes mellitus with diabetic polyneuropathy, with long-term current use of insulin  Subjective: Candace [name] is an 71 y.o. year old female who is a primary patient of [name]., MD.  The CCM team was consulted for assistance with disease management and care coordination needs.    Engaged with patient by telephone for follow up visit in response to provider referral for pharmacy case management and/or care coordination services.   Consent to Services:  The patient was given information about Chronic Care Management services, agreed to services, and gave verbal consent prior to initiation of services.  Please see initial visit note for detailed documentation.   Patient Care Team: [name]., MD as PCP - General (Family Medicine) Corey Skains, MD as Consulting Physician (Cardiology) Sharlotte Alamo, DPM (Podiatry) Neldon Labella, RN as Case Manager Virgel Manifold, MD (Inactive) as Consulting Physician (Gastroenterology) Pa, [place], Glean Salvo, [facility] as Pharmacist (Pharmacist)  Recent office visits: 01/07/21: Patient presented to Dr. Rosanna Randy for ear pain and cough. Albuterol, doxycycline 100 mg twice daily.  12/02/20: Patient presented to Dr. Rosanna Randy for follow-up. Sertraline 100 mg.   Recent consult visits: 03/12/21: Patient presented to Dr. Nehemiah Massed (Cardiology) for follow-up. No medication changes noted.   Hospital visits: 02/08/21:  Patient presented to ED for chest pain.   Objective:  Lab Results  Component Value Date   CREATININE 0.69 02/09/2021   BUN 8 02/09/2021   GFRNONAA >60 02/09/2021   GFRAA 108 02/06/2020   NA 139 02/09/2021   K 3.9 02/09/2021   CALCIUM 9.0 02/09/2021   CO2 25 02/09/2021    Lab Results  Component Value Date/Time   HGBA1C 8.5 (H) 03/26/2021 09:07 AM   HGBA1C 10.7 (H) 12/02/2020 04:06 PM   HGBA1C 9.2 07/10/2019 12:00 AM   HGBA1C 8.3 (H) 07/14/2013 05:42 AM   MICROALBUR 50 08/11/2020 08:51 AM    Last diabetic Eye exam:  Lab Results  Component Value Date/Time   HMDIABEYEEXA No Retinopathy 06/28/2019 12:00 AM    Last diabetic Foot exam: No results found for: HMDIABFOOTEX   Lab Results  Component Value Date   CHOL 164 08/11/2020   HDL 57 08/11/2020   LDLCALC 90 08/11/2020   TRIG 94 08/11/2020   CHOLHDL 2.9 08/11/2020    Hepatic Function Latest Ref Rng & Units 02/09/2021 02/08/2021 12/02/2020  Total Protein 6.5 - 8.1 g/dL 6.1(L) 6.2(L) 6.2  Albumin 3.5 - 5.0 g/dL 3.4(L) 3.6 4.0  AST 15 - 41 U/L '16 18 22  ' ALT 0 - 44 U/L '11 13 18  ' Alk Phosphatase 38 - 126 U/L 76 80 108  Total Bilirubin 0.3 - 1.2 mg/dL 0.4 0.5 0.2  Bilirubin, Direct 0.0 - 0.2 mg/dL - 0.1 -    Lab Results  Component Value Date/Time   TSH 1.180 01/03/2019 09:57 AM   TSH 1.262 09/14/2018 05:04 PM   TSH 1.661 12/23/2016 07:03 PM   TSH 0.818 10/01/2016 09:35 AM    CBC Latest Ref Rng & Units  02/09/2021 02/08/2021 12/04/2020  WBC 4.0 - 10.5 K/uL 6.0 6.7 6.1  Hemoglobin 12.0 - 15.0 g/dL 12.1 13.0 12.3  Hematocrit 36.0 - 46.0 % 38.7 40.8 36.9  Platelets 150 - 400 K/uL 180 191 185    Lab Results  Component Value Date/Time   VD25OH 12.2 (L) 10/01/2016 09:35 AM    Clinical ASCVD: Yes  The ASCVD Risk score (Arnett DK, et al., 2019) failed to calculate for the following reasons:   The patient has a prior MI or stroke diagnosis    Depression screen [facility] 2/9 10/29/2020 08/05/2020 04/15/2020  Decreased Interest 0  1 0  Down, Depressed, Hopeless 0 1 0  PHQ - 2 Score 0 2 0  Altered sleeping 1 1 -  Tired, decreased energy 1 1 -  Change in appetite 0 1 -  Feeling bad or failure about yourself  0 0 -  Trouble concentrating 0 0 -  Moving slowly or fidgety/restless 1 0 -  Suicidal thoughts 0 0 -  PHQ-9 Score 3 5 -  Difficult doing work/chores Not difficult at all Not difficult at all -  Some recent data might be hidden    Social History   Tobacco Use  Smoking Status Former   Packs/day: 0.50   Types: Cigarettes   Quit date: 10/07/2001   Years since quitting: 19.5  Smokeless Tobacco Never   BP Readings from Last 3 Encounters:  02/09/21 (!) 148/78  12/25/20 114/72  12/04/20 132/66   Pulse Readings from Last 3 Encounters:  02/09/21 74  12/25/20 88  12/04/20 75   Wt Readings from Last 3 Encounters:  02/09/21 164 lb 8 oz (74.6 kg)  12/25/20 164 lb (74.4 kg)  10/04/20 167 lb (75.8 kg)    Assessment/Interventions: Review of patient past medical history, allergies, medications, health status, including review of consultants reports, laboratory and other test data, was performed as part of comprehensive evaluation and provision of chronic care management services.   SDOH:  (Social Determinants of Health) assessments and interventions performed: Yes SDOH Interventions    Flowsheet Row Most Recent Value  SDOH Interventions   Financial Strain Interventions Intervention Not Indicated       CCM Care Plan  Allergies  Allergen Reactions   Lisinopril Cough   Penicillins Swelling, Rash and Other (See Comments)    Did it involve swelling of the face/tongue/throat, SOB, or low BP? Yes Did it involve sudden or severe rash/hives, skin peeling, or any reaction on the inside of your mouth or nose? Yes Did you need to seek medical attention at a hospital or doctor's office? Yes When did it last happen?      15 years If all above answers are NO, may proceed with cephalosporin use.      Medications Reviewed Today     Reviewed by Neldon Labella, RN (Registered Nurse) on 02/17/21 at 1013  Med List Status: <None>   Medication Order Taking? Sig Documenting Provider Last Dose Status Informant  acetaminophen (TYLENOL) 325 MG tablet [id number] No Take 650 mg by mouth every 6 (six) hours as needed for moderate pain or headache.  [provider] Past Month Active Self  albuterol (VENTOLIN HFA) 108 (90 Base) MCG/ACT inhaler [id number] No Inhale 2 puffs into the lungs every 6 (six) hours as needed for wheezing or shortness of breath. [name]., MD Past Month Active Self  bismuth subsalicylate (PEPTO BISMOL) 262 MG chewable tablet [id number] No Chew 524 mg by mouth daily as needed for  diarrhea or loose stools or indigestion. [provider] Past Month Active Self  Discontinued 03/06/19 1830   clopidogrel (PLAVIX) 75 MG tablet [id number] No Take 1 tablet (75 mg total) by mouth daily. [name]., MD 02/08/2021 Active Self  ELIQUIS 5 MG TABS tablet [id number] No Take 5 mg by mouth 2 (two) times daily. [provider] 02/08/2021 0730 Active Self           Med Note Michaelle Birks, Asra Gambrel A   Fri Jun 06, 2020 11:56 AM) Prescribed by Dr. Nehemiah Massed  ezetimibe (ZETIA) 10 MG tablet [id number] No Take 1 tablet (10 mg total) by mouth daily. [name]., MD 02/08/2021 Active Self  furosemide (LASIX) 20 MG tablet [id number] No Take 20 mg by mouth. [provider] 02/08/2021 Active Self  glucose blood test strip [id number] No Use to check blood sugars daily as instructed [name]., MD Taking Active Self  insulin glargine (LANTUS SOLOSTAR) 100 UNIT/ML Solostar Pen [id number] No Inject 14 Units into the skin at bedtime. [name]., MD 02/07/2021 Active Self  Insulin Pen Needle 32G X 4 MM MISC [id number] No Use to inject insulin daily [name]., MD Taking Active Self  isosorbide mononitrate (IMDUR) 120 MG 24  hr tablet [id number] No TAKE 1 TABLET BY MOUTH EVERY DAY  Patient taking differently: Take 120 mg by mouth daily.   [name]., MD 02/08/2021 Active Self  JARDIANCE 25 MG TABS tablet [id number] No TAKE ONE TABLET o BEFORE BREAKFAST  Patient taking differently: Take 25 mg by mouth daily.   [name]., MD 02/08/2021 Active Self  Lancets (ONETOUCH DELICA PLUS [id number]) MISC [id number] No USE UP TO 4 TIMES DAILY AS DIRECTED [name]., MD Taking Active Self  loperamide (IMODIUM) 2 MG capsule [id number] No Take 2 mg by mouth daily as needed for diarrhea or loose stools. [provider] Past Week Active Self  losartan (COZAAR) 25 MG tablet [id number]  Take 1 tablet (25 mg total) by mouth at bedtime. [name]., MD  Active   metoprolol tartrate (LOPRESSOR) 100 MG tablet [id number] No Take 100 mg by mouth 2 (two) times daily. [provider] 02/08/2021 0730 Active Self           Med Note Michaelle Birks, Cathe Mons A   Fri Jun 06, 2020 11:56 AM) Prescribed by Dr. Nehemiah Massed   nitroGLYCERIN (NITROSTAT) 0.4 MG SL tablet [id number] No Place 1 tablet (0.4 mg total) under the tongue every 5 (five) minutes as needed for chest pain. Bettey Costa, MD 02/08/2021 Active Self           Med Note Johny Drilling Mar 27, 2018  2:39 PM)    nystatin (NYSTATIN) powder [id number] No Apply 1 application topically daily.  Patient taking differently: Apply 1 application topically daily as needed.   Rubie Maid, MD Past Month Active Self  ondansetron (ZOFRAN-ODT) 8 MG disintegrating tablet [id number] No Take 1 tablet (8 mg total) by mouth every 8 (eight) hours as needed.  Patient taking differently: Take 8 mg by mouth every 8 (eight) hours as needed for refractory nausea / vomiting.   [name]., MD 02/08/2021 Active Self  pantoprazole (PROTONIX) 20 MG tablet [id number] No Take 1 tablet (20 mg total) by mouth 2 (two) times daily. [name]., MD  02/08/2021 Active Self  rosuvastatin (CRESTOR) 40 MG tablet [id number] No Take 1 tablet (40  mg total) by mouth daily. [name]., MD 02/08/2021 Active Self  sertraline (ZOLOFT) 100 MG tablet [id number] No Take 1 tablet (100 mg total) by mouth daily. [name]., MD 02/08/2021 Active Self  sertraline (ZOLOFT) 50 MG tablet [id number] No TAKE ONE TABLET BY MOUTH ONCE DAILY  Patient not taking: Reported on 02/08/2021   [name]., MD Not Taking Active Self  TRULICITY 1.5 EN/2.7PO [facility] [id number] No Inject 1.23m into THE SKIN ONCE A WEEK  Patient taking differently: Inject 1.5 mg as directed once a week.   GJerrol Banana, MD 02/05/2021 Active Self            Patient Active Problem List   Diagnosis Date Noted   Vertigo    Unsteady gait    Acute bronchitis 03/07/2019   Esophageal dysphagia    Stomach irritation    Gastric polyp    Esophageal lump    Hx of colonic polyp    Polyp of colon    Diverticulosis of large intestine without diverticulitis    PSVT (paroxysmal supraventricular tachycardia) (HCC) 10/03/2018   Abnormal ECG 07/05/2018   Tachycardia 03/27/2018   Pain in limb 02/28/2018   Chronic diastolic CHF (congestive heart failure) ([place] 11/03/2017   TIA (transient ischemic attack) 11/03/2017   COPD suggested by initial evaluation ([place] 08/23/2017   Acute and chronic respiratory failure with hypoxia ([place] 08/23/2017   Postoperative state 08/15/2017   Incidental pulmonary nodule 08/01/2017   Non-ST elevation myocardial infarction (NSTEMI), subendocardial infarction, subsequent episode of care ([facility] 06/01/2017   B12 deficiency 12/01/2016   Vitamin D deficiency 11/04/2016   Depression with anxiety 09/30/2016   AF (paroxysmal atrial fibrillation) ([place] 09/30/2016   Resting tremor 09/30/2016   Mild obstructive sleep apnea 09/30/2016   Headache 08/18/2016   Unstable angina (HD[place] 08/03/2016   Type 2 diabetes mellitus with diabetic  polyneuropathy, with long-term current use of insulin ([place] 08/03/2016   Chronic daily headache 05/05/2016   Asthma 11/11/2015   S/P CABG x 4 11/10/2015   Coronary artery disease 11/07/2015   Carotid artery narrowing 02/08/2014   Chest pain 02/08/2014   Mixed hyperlipidemia 02/08/2014   Bilateral carotid artery stenosis 02/08/2014   Atypical chest pain 02/07/2014   Essential hypertension 02/07/2014   Awareness of heartbeats 02/07/2014   D (diarrhea) 10/05/2013   Gastroesophageal reflux disease 10/05/2013    Immunization History  Administered Date(s) Administered   Fluad Quad(high Dose 65+) 12/28/2018, 12/25/2020   Influenza, High Dose Seasonal PF 12/24/2016, 12/08/2017   Influenza,inj,Quad PF,6+ Mos 12/19/2015, 01/18/2020   Influenza-Unspecified 03/06/2015   Moderna Sars-Covid-2 Vaccination 04/14/2019, 05/12/2019, 01/28/2020   Pneumococcal Conjugate-13 03/16/2018   Pneumococcal Polysaccharide-23 12/24/2016   Tdap 07/28/2010   Zoster Recombinat (Shingrix) 06/14/2020    Conditions to be addressed/monitored:  Hypertension, Hyperlipidemia, Diabetes, Atrial Fibrillation, Heart Failure, Coronary Artery Disease, GERD, Asthma, Depression and Anxiety  Care Plan : General Pharmacy (Adult)  Updates made by FGermaine Pomfret RPH since 04/20/2021 12:00 AM     Problem: Hypertension, Hyperlipidemia, Diabetes, Atrial Fibrillation, Heart Failure, Coronary Artery Disease, GERD, Asthma, Depression and Anxiety   Priority: High     Long-Range Goal: Patient-Specific Goal   Start Date: 06/06/2020  Expected End Date: 11/25/2021  This Visit's Progress: On track  Recent Progress: On track  Priority: High  Note:   Current Barriers:  Unable to achieve control of diabetes  Unable to achieve control of cholesterol Does not adhere to prescribed medication regimen  Pharmacist Clinical Goal(s):  Over the next 90 days, patient will achieve control of diabetes as evidenced by A1c less than  8% patient will achieve control of cholesterol as evidenced by LDL less than 70 adhere to prescribed medication regimen as evidenced by utilization of enhanced pharmacy services through collaboration with PharmD and provider.   Interventions: 1:1 collaboration with [name]., MD regarding development and update of comprehensive plan of care as evidenced by provider attestation and co-signature Inter-disciplinary care team collaboration (see longitudinal plan of care) Comprehensive medication review performed; medication list updated in electronic medical record  Diabetes (A1c goal <7%) -Uncontrolled -Diagnosed 2010 -Current medications:  Trulicity 1.5 mg weekly on Tuesdays: Appropriate, Query effective   Jardiance 25 mg daily: Appropriate, Query effective   Lantus 18 units nightly (0.24 u/kg): Appropriate, Query effective   -Medications previously tried: Glipizide, Lantus, Tradjenta, Metformin, Metformin XR, Ozempic   -Current home glucose readings:  Fasting  13-Jan 147  14-Jan 163  15-Jan 157  16-Jan 146  17-Jan 134  18-Jan 122  19-Jan 154  20-Jan 143  21-Jan 139  22-Jan 137  23-Jan 160  Average 146   -Reports relative hypoglycemic symptoms: Nervous, Jittery. Occurs when blood sugars fall below 120s.  -Patient does endorse rare episodes of nausea. Unsure if related to Trulicity use.  -Current meal patterns: Light meals, mainly salads, yogurt.  -Current exercise: Less exercise due to the weather. Walking 3-4 times weekly (15-20 minutes)   -Given relative hypoglycemia symptoms, will defer intensifying insulin at this time and focus on lifestyle changes.  -Continue current medications   Hyperlipidemia: (LDL goal < 70) -History of CABG x4 2017, 2018  -Uncontrolled -Current treatment: Rosuvastatin 40 mg daily: Appropriate, Query effective  Ezetimibe 10 mg daily: Appropriate, Query effective   -Current antiplatelet treatment: Clopidogrel 75 mg daily   -Medications previously tried: Atorvastatin (changed to rosuvastatin during hospitalization)  -Recommended to continue current medication -Recheck fasting lipid panel at PCP follow-up.   Atrial Fibrillation (Goal: prevent stroke and major bleeding) -Controlled -CHADSVASC: 6 -Current treatment: Rate control: Metoprolol tartrate 100 mg twice daily: Appropriate, Query effective  Anticoagulation: Eliquis 5 mg twice daily: Appropriate, Query effective  -Medications previously tried: NA -Denies symptoms of A-fib -Recommended to continue current medication  Heart Failure (Goal: manage symptoms and prevent exacerbations) -Controlled -Last ejection fraction: 55-60% (Date: June 2021) -HF type: Diastolic -NYHA Class: III (marked limitation of activity) -AHA HF Stage: C (Heart disease and symptoms present) -Current treatment: Furosemide 20 mg daily: Appropriate, Query effective Imdur 30 mg daily: Appropriate, Query effective  Losartan 25 mg daily: Appropriate, Query effective Metoprolol tartrate 100 mg twice daily: Appropriate, Query effective  -Medications previously tried: NA  -Current home BP/HR readings: 128/70  -Educated on Importance of weighing daily; if you gain more than 3 pounds in one day or 5 pounds in one week, contact provider's office -Recommended to continue current medication  Depression/Anxiety (Goal: maintain stable mood) -Controlled -Current treatment: Sertraline 100 mg daily  -Medications previously tried/failed: NA -PHQ9: 0 -GAD7: 1 - Patient reports she manages her stress by talking with daughter, reading, or medidating  -Continue current medications  GERD with dysphagia (Goal: prevent heartburn or reflux symptoms) -Controlled -Current treatment  Pantoprazole 40 mg daily   -Medications previously tried: Sucralfate,  -Continue current medications  Patient Goals/Self-Care Activities Over the next 90 days, patient will:  - check glucose daily, before  breakfast, document, and provide at future appointments check blood pressure weekly, document, and provide at  future appointments  Follow Up Plan: Telephone follow up appointment with care management team member scheduled for:  06/12/2021 at 8:30 AM    Medication Assistance: None required.  Patient affirms current coverage meets needs.  Patient's preferred pharmacy is:  [facility] - [place], [place] - [address] [place] [address] Phone: [phone] Fax: [phone]  [id number] [place]: [phone]: [phone] [id number] [place] [address] [place]: [phone]: [phone] decided to: Utilize [facility] for medication synchronization, packaging and delivery   Care Plan and Follow Up Patient Decision:  Patient agrees to Care Plan and Follow-up.  Plan: Telephone follow up appointment with care management team member scheduled for:  06/12/2021 at 8:30 AM  AlJunius ArgylePharmD, BCPara [phone]

## 2021-04-23 ENCOUNTER — Other Ambulatory Visit: Payer: Self-pay | Admitting: Family Medicine

## 2021-04-23 NOTE — Telephone Encounter (Signed)
Already filled 04/23/21.  Requested Prescriptions  Refused Prescriptions Disp Refills   ondansetron (ZOFRAN-ODT) 8 MG disintegrating tablet [Pharmacy Med Name: ondansetron 8 mg disintegrating tablet] 20 tablet 1    Sig: TAKE ONE TABLET BY MOUTH EVERY 8 HOURS AS NEEDED     Not Delegated - Gastroenterology: Antiemetics Failed - 04/23/2021 12:08 PM      Failed - This refill cannot be delegated      Passed - Valid encounter within last 6 months    Recent Outpatient Visits          3 months ago Right ear pain   [place] [name]., MD   3 months ago Uncontrolled type 2 diabetes mellitus with hyperglycemia [facility])   [facility] [name]., MD   4 months ago Chest pain, unspecified type   [facility] [name]., MD   8 months ago Uncontrolled type 2 diabetes mellitus with hyperglycemia [facility])   [facility] [name]., MD   11 months ago Viral URI   [facility] [name]., MD      Future Appointments            In 2 months [name]., MD [facility] [place]

## 2021-04-24 NOTE — Telephone Encounter (Signed)
Pt wants to reschedule her AWV again.  It was 04/28/21.  Please call pt, thanks!

## 2021-04-28 DIAGNOSIS — I480 Paroxysmal atrial fibrillation: Secondary | ICD-10-CM | POA: Diagnosis not present

## 2021-04-28 DIAGNOSIS — I10 Essential (primary) hypertension: Secondary | ICD-10-CM

## 2021-04-28 DIAGNOSIS — I5032 Chronic diastolic (congestive) heart failure: Secondary | ICD-10-CM

## 2021-04-28 DIAGNOSIS — E782 Mixed hyperlipidemia: Secondary | ICD-10-CM

## 2021-04-28 DIAGNOSIS — E1142 Type 2 diabetes mellitus with diabetic polyneuropathy: Secondary | ICD-10-CM

## 2021-04-28 DIAGNOSIS — Z794 Long term (current) use of insulin: Secondary | ICD-10-CM

## 2021-04-29 ENCOUNTER — Ambulatory Visit (INDEPENDENT_AMBULATORY_CARE_PROVIDER_SITE_OTHER): Payer: 59

## 2021-04-29 ENCOUNTER — Ambulatory Visit: Payer: Medicare Other | Admitting: Family Medicine

## 2021-04-29 DIAGNOSIS — I10 Essential (primary) hypertension: Secondary | ICD-10-CM

## 2021-04-29 DIAGNOSIS — I5032 Chronic diastolic (congestive) heart failure: Secondary | ICD-10-CM

## 2021-04-29 DIAGNOSIS — E1142 Type 2 diabetes mellitus with diabetic polyneuropathy: Secondary | ICD-10-CM

## 2021-04-29 DIAGNOSIS — Z794 Long term (current) use of insulin: Secondary | ICD-10-CM

## 2021-04-29 NOTE — Patient Instructions (Signed)
Thank you for allowing the Chronic Care Management team to participate in your care. It was great speaking with you today! °

## 2021-04-29 NOTE — Chronic Care Management (AMB) (Signed)
Chronic Care Management   CCM RN Visit Note  04/29/2021 Name: Candace [name] MRN: [id number] DOB: 1950-07-01  Subjective: Candace [name] is a 71 y.o. year old female who is a primary care patient of [name]., MD. The care management team was consulted for assistance with disease management and care coordination needs.     Engaged with patient by telephone for follow up visit in response to provider referral for case management and care coordination services.   Consent to Services:  The patient was given information about Chronic Care Management services, agreed to services, and gave verbal consent prior to initiation of services.  Please see initial visit note for detailed documentation.    Assessment: Review of patient past medical history, allergies, medications, health status, including review of consultants reports, laboratory and other test data, was performed as part of comprehensive evaluation and provision of chronic care management services.   SDOH (Social Determinants of Health) assessments and interventions performed: No  CCM Care Plan  Allergies  Allergen Reactions   Lisinopril Cough   Penicillins Swelling, Rash and Other (See Comments)    Did it involve swelling of the face/tongue/throat, SOB, or low BP? Yes Did it involve sudden or severe rash/hives, skin peeling, or any reaction on the inside of your mouth or nose? Yes Did you need to seek medical attention at a hospital or doctor's office? Yes When did it last happen?      15 years If all above answers are "NO", may proceed with cephalosporin use.     Outpatient Encounter Medications as of 04/29/2021  Medication Sig Note   acetaminophen (TYLENOL) 325 MG tablet Take 650 mg by mouth every 6 (six) hours as needed for moderate pain or headache.     albuterol (VENTOLIN HFA) 108 (90 Base) MCG/ACT inhaler Inhale 2 puffs into the lungs every 6 (six) hours as needed for wheezing or shortness of breath.     bismuth subsalicylate (PEPTO BISMOL) 262 MG chewable tablet Chew 524 mg by mouth daily as needed for diarrhea or loose stools or indigestion.    Blood Glucose Monitoring Suppl (ONETOUCH VERIO) w/Device KIT Use daily to check blood sugar    clopidogrel (PLAVIX) 75 MG tablet Take 1 tablet (75 mg total) by mouth daily.    Dulaglutide (TRULICITY) 1.5 IY/6.4BR SOPN Inject 1.5 mg into the skin once a week. Inject 1.81m into THE SKIN ONCE A WEEK Strength: 1.5 MG/0.5ML    ELIQUIS 5 MG TABS tablet Take 5 mg by mouth 2 (two) times daily. 06/06/2020: Prescribed by Dr. KNehemiah Massed  ezetimibe (ZETIA) 10 MG tablet Take 1 tablet (10 mg total) by mouth daily.    furosemide (LASIX) 20 MG tablet Take 20 mg by mouth daily.    glucose blood test strip Use to check blood sugars daily as instructed    insulin glargine (LANTUS SOLOSTAR) 100 UNIT/ML Solostar Pen Inject 18 Units into the skin at bedtime.    Insulin Pen Needle 32G X 4 MM MISC Use to inject insulin daily    isosorbide mononitrate (IMDUR) 120 MG 24 hr tablet TAKE 1 TABLET BY MOUTH EVERY DAY (Patient taking differently: Take 120 mg by mouth daily.)    JARDIANCE 25 MG TABS tablet TAKE ONE TABLET o BEFORE BREAKFAST (Patient taking differently: Take 25 mg by mouth daily.)    Lancets (ONETOUCH DELICA PLUS [id number] MISC USE UP TO 4 TIMES DAILY AS DIRECTED    loperamide (IMODIUM) 2 MG capsule Take 2  mg by mouth daily as needed for diarrhea or loose stools.    losartan (COZAAR) 25 MG tablet Take 1 tablet (25 mg total) by mouth at bedtime.    metoprolol tartrate (LOPRESSOR) 100 MG tablet Take 100 mg by mouth 2 (two) times daily. 06/06/2020: Prescribed by Dr. Nehemiah Massed    nitroGLYCERIN (NITROSTAT) 0.4 MG SL tablet Place 1 tablet (0.4 mg total) under the tongue every 5 (five) minutes as needed for chest pain.    nystatin (NYSTATIN) powder Apply 1 application topically daily. (Patient taking differently: Apply 1 application topically daily as needed.)    ondansetron  (ZOFRAN-ODT) 8 MG disintegrating tablet Take 1 tablet (8 mg total) by mouth every 8 (eight) hours as needed. (Patient taking differently: Take 8 mg by mouth every 8 (eight) hours as needed for refractory nausea / vomiting.)    pantoprazole (PROTONIX) 20 MG tablet Take 1 tablet (20 mg total) by mouth 2 (two) times daily.    rosuvastatin (CRESTOR) 40 MG tablet Take 1 tablet (40 mg total) by mouth daily.    sertraline (ZOLOFT) 100 MG tablet Take 1 tablet (100 mg total) by mouth daily.    [DISCONTINUED] cetirizine (ZYRTEC) 5 MG tablet Take 2 tablets (10 mg total) by mouth daily.    No facility-administered encounter medications on file as of 04/29/2021.    Patient Active Problem List   Diagnosis Date Noted   Vertigo    Unsteady gait    Acute bronchitis 03/07/2019   Esophageal dysphagia    Stomach irritation    Gastric polyp    Esophageal lump    Hx of colonic polyp    Polyp of colon    Diverticulosis of large intestine without diverticulitis    PSVT (paroxysmal supraventricular tachycardia) ([place]) 10/03/2018   Abnormal ECG 07/05/2018   Tachycardia 03/27/2018   Pain in limb 02/28/2018   Chronic diastolic CHF (congestive heart failure) ([place]) 11/03/2017   TIA (transient ischemic attack) 11/03/2017   COPD suggested by initial evaluation ([place]) 08/23/2017   Acute and chronic respiratory failure with hypoxia ([place]) 08/23/2017   Postoperative state 08/15/2017   Incidental pulmonary nodule 08/01/2017   Non-ST elevation myocardial infarction (NSTEMI), subendocardial infarction, subsequent episode of care ([place]) 06/01/2017   B12 deficiency 12/01/2016   Vitamin D deficiency 11/04/2016   Depression with anxiety 09/30/2016   AF (paroxysmal atrial fibrillation) ([place]) 09/30/2016   Resting tremor 09/30/2016   Mild obstructive sleep apnea 09/30/2016   Headache 08/18/2016   Unstable angina ([place]) 08/03/2016   Type 2 diabetes mellitus with diabetic polyneuropathy, with long-term current use of insulin  ([place]) 08/03/2016   Chronic daily headache 05/05/2016   Asthma 11/11/2015   S/P CABG x 4 11/10/2015   Coronary artery disease 11/07/2015   Carotid artery narrowing 02/08/2014   Chest pain 02/08/2014   Mixed hyperlipidemia 02/08/2014   Bilateral carotid artery stenosis 02/08/2014   Atypical chest pain 02/07/2014   Essential hypertension 02/07/2014   Awareness of heartbeats 02/07/2014   D (diarrhea) 10/05/2013   Gastroesophageal reflux disease 10/05/2013    Patient Care Plan: RN Care Management Plan of Care     Problem Identified: DM, HF, HTN, A-Fib and DM      Long-Range Goal: Disease Progression Prevented or Minimized   Start Date: 04/29/2021  Expected End Date: 07/28/2021  Priority: High  Note:   Current Barriers:  Chronic Disease Management support and education needs related to Atrial Fibrillation, CHF, HTN, and DMII  RNCM Clinical Goal(s):  Patient will continue to  work with Consulting civil engineer to address care management and care coordination needs related to  Atrial Fibrillation, CHF, HTN, and DMII through collaboration with RN Care manager, provider, and care team.   Interventions: 1:1 collaboration with primary care provider regarding development and update of comprehensive plan of care as evidenced by provider attestation and co-signature Inter-disciplinary care team collaboration (see longitudinal plan of care) Evaluation of current treatment plan related to  self management and patient's adherence to plan as established by provider  Hypertension Interventions: Reviewed plan for hypertension management.  Advised to monitor BP readings and notify the clinic or Cardiology team for readings outside of established parameters. Discussed complications of uncontrolled blood pressure.  Reviewed s/sx of heart attack, stroke and worsening symptoms that require immediate medical attention.   Diabetes Interventions: Lab Results  Component Value Date   HGBA1C 8.5 (H) 03/26/2021     Reviewed plan for diabetes management. Reports compliance with medications and blood glucose monitoring. Reports levels are improving. Fasting blood glucose reading today was 112 mg/dl. Reviewed s/sx hypoglycemia and hyperglycemia. Denies episodes since the last outreach. Reminded to monitor blood glucose level before administering evening insulin dose. Reviewed nutritional intake and activity engagement. Reports monitoring portions and carbohydrate intake as advised. She does not have a daily exercise regimen but reports engaging in low impact activities without difficulty. Remains motivated to improve nutritional intake and overall diabetes management.    Heart Failure Interventions: Reviewed plan for CHF management. Reports compliance with medications. Reports monitoring weights as advised. Reports weights have been within range.  Reviewed s/sx of fluid overload and indications for notifying a provider. Denies abdominal or lower extremity edema. Denies recent episodes of chest discomfort, palpitations, or shortness of breath. Denies weakness or fatigue. Reports improvements with activity tolerance. Reviewed s/sx of complications r/t CHF exacerbation and indications for seeking immediate medical attention.    Patient Goals/Self-Care Activities: Patient will self administer medications as prescribed Patient will attend all scheduled provider appointments Patient will call pharmacy for medication refills Patient will continue to perform ADL's independently Patient will continue to perform IADL's independently Patient will call provider office for new concerns or questions   Follow Up Plan:   Will follow up next month.      PLAN A member of the care management team will follow up next month.   [name] Care Management [facility] [phone]

## 2021-05-04 ENCOUNTER — Other Ambulatory Visit: Payer: Self-pay | Admitting: Family Medicine

## 2021-05-04 DIAGNOSIS — E1159 Type 2 diabetes mellitus with other circulatory complications: Secondary | ICD-10-CM

## 2021-05-04 DIAGNOSIS — I152 Hypertension secondary to endocrine disorders: Secondary | ICD-10-CM

## 2021-05-04 NOTE — Telephone Encounter (Signed)
Requested Prescriptions  Pending Prescriptions Disp Refills   isosorbide mononitrate (IMDUR) 120 MG 24 hr tablet [Pharmacy Med Name: ISOSORBIDE MONONIT ER 120 MG] 90 tablet 0    Sig: TAKE 1 TABLET BY MOUTH EVERY DAY     Cardiovascular:  Nitrates Failed - 05/04/2021  1:40 AM      Failed - Last BP in normal range    BP Readings from Last 1 Encounters:  02/09/21 (!) 148/78         Passed - Last Heart Rate in normal range    Pulse Readings from Last 1 Encounters:  02/09/21 74         Passed - Valid encounter within last 12 months    Recent Outpatient Visits          3 months ago Right ear pain   [facility] [name]., MD   4 months ago Uncontrolled type 2 diabetes mellitus with hyperglycemia [facility])   [facility] [name]., MD   5 months ago Chest pain, unspecified type   [facility] [name]., MD   8 months ago Uncontrolled type 2 diabetes mellitus with hyperglycemia [facility])   [facility] [name]., MD   11 months ago Viral URI   [facility] [name]., MD      Future Appointments            In 1 month [name]., MD [facility] [place]

## 2021-05-06 ENCOUNTER — Ambulatory Visit: Payer: Medicare Other | Admitting: Family Medicine

## 2021-05-07 ENCOUNTER — Other Ambulatory Visit: Payer: Self-pay | Admitting: Family Medicine

## 2021-05-07 DIAGNOSIS — E1165 Type 2 diabetes mellitus with hyperglycemia: Secondary | ICD-10-CM

## 2021-05-07 NOTE — Telephone Encounter (Signed)
Requested medication (s) are due for refill today:   Yes ° °Requested medication (s) are on the active medication list:   Yes ° °Future visit scheduled:   Yes ° ° °Last ordered: 04/09/2021 2 ml, 5 refills ° °Returned because A1C criteria not met on protocol.   Also it looks like this rx was sent as a "print" instead of normal.   Pharmacy requesting 2 refills instead of 5.   ° °Requested Prescriptions  °Pending Prescriptions Disp Refills  ° TRULICITY 1.5 MG/0.5ML SOPN [Pharmacy Med Name: Trulicity 1.5 mg/0.5 mL subcutaneous pen injector] 2 mL 2  °  Sig: INJECT 1.5 MG into THE SKIN ONCE A WEEK  °  ° Endocrinology:  Diabetes - GLP-1 Receptor Agonists Failed - 05/07/2021  9:03 AM  °  °  Failed - HBA1C is between 0 and 7.9 and within 180 days  °  Hemoglobin A1C  °Date Value Ref Range Status  °07/10/2019 9.2  Final  ° °Hgb A1c MFr Bld  °Date Value Ref Range Status  °03/26/2021 8.5 (H) 4.8 - 5.6 % Final  °  Comment:  °           Prediabetes: 5.7 - 6.4 °         Diabetes: >6.4 °         Glycemic control for adults with diabetes: <7.0 °  °  °  °  °  Passed - Valid encounter within last 6 months  °  Recent Outpatient Visits   ° °      ° 4 months ago Right ear pain  ° [facility] Gilbert, Richard L Jr., MD  ° 4 months ago Uncontrolled type 2 diabetes mellitus with hyperglycemia (HCC)  ° [facility] Gilbert, Richard L Jr., MD  ° 5 months ago Chest pain, unspecified type  ° [facility] Gilbert, Richard L Jr., MD  ° 8 months ago Uncontrolled type 2 diabetes mellitus with hyperglycemia (HCC)  ° [facility] Gilbert, Richard L Jr., MD  ° 11 months ago Viral URI  ° [facility] Gilbert, Richard L Jr., MD  ° °  °  °Future Appointments   ° °        ° In 1 month Gilbert, Richard L Jr., MD [facility]  ° °  ° °  °  °  ° °

## 2021-05-12 ENCOUNTER — Telehealth: Payer: Self-pay

## 2021-05-12 DIAGNOSIS — E1169 Type 2 diabetes mellitus with other specified complication: Secondary | ICD-10-CM

## 2021-05-12 DIAGNOSIS — E785 Hyperlipidemia, unspecified: Secondary | ICD-10-CM

## 2021-05-12 DIAGNOSIS — F4320 Adjustment disorder, unspecified: Secondary | ICD-10-CM

## 2021-05-12 DIAGNOSIS — K219 Gastro-esophageal reflux disease without esophagitis: Secondary | ICD-10-CM

## 2021-05-12 NOTE — Progress Notes (Signed)
Chronic Care Management Pharmacy Assistant   Name: CHASADY LONGWELL  MRN: [id number] DOB: 1950-12-10  Reason for Encounter: Medication Review/Medication Coordination call.   Recent office visits:  04/29/2021  Neldon Labella RN (CCM) No medication Changes noted 04/20/2021 Felecia McCray RN (CCM) No medication Changes noted  Recent consult visits:  No recent consult visit  Hospital visits:  None in previous 6 months  Medications: Outpatient Encounter Medications as of 05/12/2021  Medication Sig Note   acetaminophen (TYLENOL) 325 MG tablet Take 650 mg by mouth every 6 (six) hours as needed for moderate pain or headache.     albuterol (VENTOLIN HFA) 108 (90 Base) MCG/ACT inhaler Inhale 2 puffs into the lungs every 6 (six) hours as needed for wheezing or shortness of breath.    bismuth subsalicylate (PEPTO BISMOL) 262 MG chewable tablet Chew 524 mg by mouth daily as needed for diarrhea or loose stools or indigestion.    Blood Glucose Monitoring Suppl (ONETOUCH VERIO) w/Device KIT Use daily to check blood sugar    clopidogrel (PLAVIX) 75 MG tablet Take 1 tablet (75 mg total) by mouth daily.    ELIQUIS 5 MG TABS tablet Take 5 mg by mouth 2 (two) times daily. 06/06/2020: Prescribed by Dr. Nehemiah Massed   ezetimibe (ZETIA) 10 MG tablet Take 1 tablet (10 mg total) by mouth daily.    furosemide (LASIX) 20 MG tablet Take 20 mg by mouth daily.    glucose blood test strip Use to check blood sugars daily as instructed    insulin glargine (LANTUS SOLOSTAR) 100 UNIT/ML Solostar Pen Inject 18 Units into the skin at bedtime.    Insulin Pen Needle 32G X 4 MM MISC Use to inject insulin daily    isosorbide mononitrate (IMDUR) 120 MG 24 hr tablet TAKE 1 TABLET BY MOUTH EVERY DAY    JARDIANCE 25 MG TABS tablet TAKE ONE TABLET o BEFORE BREAKFAST (Patient taking differently: Take 25 mg by mouth daily.)    Lancets (ONETOUCH DELICA PLUS [id number]) MISC USE UP TO 4 TIMES DAILY AS DIRECTED    loperamide (IMODIUM) 2  MG capsule Take 2 mg by mouth daily as needed for diarrhea or loose stools.    losartan (COZAAR) 25 MG tablet Take 1 tablet (25 mg total) by mouth at bedtime.    metoprolol tartrate (LOPRESSOR) 100 MG tablet Take 100 mg by mouth 2 (two) times daily. 06/06/2020: Prescribed by Dr. Nehemiah Massed    nitroGLYCERIN (NITROSTAT) 0.4 MG SL tablet Place 1 tablet (0.4 mg total) under the tongue every 5 (five) minutes as needed for chest pain.    nystatin (NYSTATIN) powder Apply 1 application topically daily. (Patient taking differently: Apply 1 application topically daily as needed.)    ondansetron (ZOFRAN-ODT) 8 MG disintegrating tablet Take 1 tablet (8 mg total) by mouth every 8 (eight) hours as needed. (Patient taking differently: Take 8 mg by mouth every 8 (eight) hours as needed for refractory nausea / vomiting.)    pantoprazole (PROTONIX) 20 MG tablet Take 1 tablet (20 mg total) by mouth 2 (two) times daily.    rosuvastatin (CRESTOR) 40 MG tablet Take 1 tablet (40 mg total) by mouth daily.    sertraline (ZOLOFT) 100 MG tablet Take 1 tablet (100 mg total) by mouth daily.    TRULICITY 1.5 JE/5.6DJ SOPN INJECT 1.5 MG into THE SKIN ONCE A WEEK    [DISCONTINUED] cetirizine (ZYRTEC) 5 MG tablet Take 2 tablets (10 mg total) by mouth daily.    No facility-administered  encounter medications on file as of 05/12/2021.    Care Gaps: COVID 19 Vaccine (4- Booster) Ophthalmology Exam (Last Completed 06/28/2019) Tetanus Vaccine (Last Completed 07/28/2010) Shingrix Vaccine (2 of 2) (Last Completed 06/14/2020) Foot Exam (Last Completed 10/27/2019)  Star Rating Drugs: 02/16/2021 Rosuvastatin 40 mg last filled for a 90- Day supply at [facility] 02/17/2021 Losartan 25 mg last filled for a 90-Day supply at [facility] 02/16/2021 Jardiance 25 mg last filled for a 90-Day Supply at [facility] 77/93/9030 Trulicity 1.5 mg last filled for a 84-Day supply at [facility] Medication Fill Gaps: None  ID  Reviewed chart for medication changes ahead of medication coordination call.  BP Readings from Last 3 Encounters:  02/09/21 (!) 148/78  12/25/20 114/72  12/04/20 132/66    Lab Results  Component Value Date   HGBA1C 8.5 (H) 03/26/2021     Patient obtains medications through Adherence Packaging  90 Days   Per [facility] pharmacy data:  Last adherence delivery included:  Pantoprazole 20 mg one tablet two times a day- breakfast, Evening meals Losartan 25 mg one tablet daily- Bedtime Eliquis 5 mg 1 tablet two times daily breakfast, evening meals Jardiance 25 mg 1 tablet daily before breakfast Rosuvastatin 40 mg 1 tablet daily - Breakfast Sertraline 100 mg 1 tablet daily breakfast  Metoprolol tartrate 100 mg 1 tablet 2 times daily Breakfast and Evening Meals Nitroglycerin 0.4 mg PRN Trulicity inject 1.5 mg into the skin weekly Insulin Glargine 100 unit/ML Solostar Pen- Inject 14 units into the skin at bedtime  Onetouch Ultra Test Strip   Patient declined medications last delivery: Isosorbide mononitrate 120 mg daily breakfast - (receive 90 day supply from [facility] on 01/31/2021) ezetimibe 10 mg 1 tablet daily breakfast - (receive 90 day supply from [facility] on 12/23/2020). Clopidogrel 75 mg 1 tablet daily breakfast (receive 90 day supply from [facility] on 12/23/2020). Ondansetron 8 mg PRN (Adequate supply, 7 day supply given on 01/05/2021) Furosemide 20 mg 1 tablet daily -Breakfast, (Adequate supply 90 day supply given on 12/01/2020 from [facility]).  Patient is due for next adherence delivery on: 05/22/2021. Called patient and reviewed medications and coordinated delivery.  Unable to reach patient to completed Medication Coordination form. Form was completed based on last month delivery. [facility] pharmacy will contact patient to confirm delivery.Junius Argyle, CPP was notified I was unable to reach patient  This delivery to include: Pantoprazole 20 mg one tablet  two times a day- breakfast, Evening meals Losartan 25 mg one tablet daily- Bedtime Eliquis 5 mg 1 tablet two times daily breakfast, evening meals Jardiance 25 mg 1 tablet daily before breakfast Rosuvastatin 40 mg 1 tablet daily - Breakfast Sertraline 100 mg 1 tablet daily breakfast  Metoprolol tartrate 100 mg 1 tablet 2 times daily Breakfast and Evening Meals Nitroglycerin 0.4 mg PRN Onetouch Ultra Test Strip    Patient declined the following medications  Trulicity inject 1.5 mg into the skin weekly (last filled 05/07/2021 for 28 day supply at [facility] pharmacy)  Insulin Glargine 100 unit/ML Solostar Pen- Inject 14 units into the skin at bedtime (last filled 03/24/2021 for 100 day supply from [facility]) Pen Needles - adequate supply (last filled 04/10/2021 for 90 day at [facility]) Isosorbide mononitrate 120 mg daily breakfast - (receive 90 day supply from [facility] on 05/04/2021) ezetimibe 10 mg 1 tablet daily breakfast - (receive 90 day supply from [facility] on 03/24/2021). Clopidogrel 75 mg 1 tablet daily breakfast (receive 90 day supply  from [facility] on 03/24/2021). Ondansetron 8 mg PRN (Adequate supply, 7 day supply given on 04/23/2021) Furosemide 20 mg 1 tablet daily -Breakfast, (Adequate supply 90 day supply given on 12/01/2020 from [facility]).   Patient needs refills for Pantoprazole,Sertraline,Rosuvastatin prescribe by PCP, Eliquis,.  Confirmed delivery date of 05/22/2021 ([facility]), advised patient that pharmacy will contact them the morning of delivery.  Telephone follow up appointment with Care management team member scheduled for : 06/12/2021 at 8:30 am.  [place] Pharmacist Assistant [phone]

## 2021-05-14 ENCOUNTER — Ambulatory Visit: Payer: Self-pay

## 2021-05-14 DIAGNOSIS — Z794 Long term (current) use of insulin: Secondary | ICD-10-CM

## 2021-05-14 DIAGNOSIS — I5032 Chronic diastolic (congestive) heart failure: Secondary | ICD-10-CM

## 2021-05-14 DIAGNOSIS — E1142 Type 2 diabetes mellitus with diabetic polyneuropathy: Secondary | ICD-10-CM

## 2021-05-14 DIAGNOSIS — I200 Unstable angina: Secondary | ICD-10-CM

## 2021-05-14 NOTE — Chronic Care Management (AMB) (Addendum)
Chronic Care Management   CCM RN Visit Note  05/14/2021 Name: Candace [name] MRN: [id number] DOB: 07/31/1950  Subjective: Candace [name] is a 71 y.o. year old female who is a primary care patient of [name]., MD. The care management team was consulted for assistance with disease management and care coordination needs.    Engaged with patient by telephone for follow up visit in response to provider referral for case management and care coordination services.   Consent to Services:  The patient was given information about Chronic Care Management services, agreed to services, and gave verbal consent prior to initiation of services.  Please see initial visit note for detailed documentation.   Assessment: Review of patient past medical history, allergies, medications, health status, including review of consultants reports, laboratory and other test data, was performed as part of comprehensive evaluation and provision of chronic care management services.   SDOH (Social Determinants of Health) assessments and interventions performed: No  CCM Care Plan  Allergies  Allergen Reactions   Lisinopril Cough   Penicillins Swelling, Rash and Other (See Comments)    Did it involve swelling of the face/tongue/throat, SOB, or low BP? Yes Did it involve sudden or severe rash/hives, skin peeling, or any reaction on the inside of your mouth or nose? Yes Did you need to seek medical attention at a hospital or doctor's office? Yes When did it last happen?      15 years If all above answers are "NO", may proceed with cephalosporin use.     Outpatient Encounter Medications as of 05/14/2021  Medication Sig Note   acetaminophen (TYLENOL) 325 MG tablet Take 650 mg by mouth every 6 (six) hours as needed for moderate pain or headache.     albuterol (VENTOLIN HFA) 108 (90 Base) MCG/ACT inhaler Inhale 2 puffs into the lungs every 6 (six) hours as needed for wheezing or shortness of breath.     bismuth subsalicylate (PEPTO BISMOL) 262 MG chewable tablet Chew 524 mg by mouth daily as needed for diarrhea or loose stools or indigestion.    Blood Glucose Monitoring Suppl (ONETOUCH VERIO) w/Device KIT Use daily to check blood sugar    clopidogrel (PLAVIX) 75 MG tablet Take 1 tablet (75 mg total) by mouth daily.    ELIQUIS 5 MG TABS tablet Take 5 mg by mouth 2 (two) times daily. 06/06/2020: Prescribed by Dr. Nehemiah Massed   ezetimibe (ZETIA) 10 MG tablet Take 1 tablet (10 mg total) by mouth daily.    furosemide (LASIX) 20 MG tablet Take 20 mg by mouth daily.    glucose blood test strip Use to check blood sugars daily as instructed    insulin glargine (LANTUS SOLOSTAR) 100 UNIT/ML Solostar Pen Inject 18 Units into the skin at bedtime.    Insulin Pen Needle 32G X 4 MM MISC Use to inject insulin daily    isosorbide mononitrate (IMDUR) 120 MG 24 hr tablet TAKE 1 TABLET BY MOUTH EVERY DAY    JARDIANCE 25 MG TABS tablet TAKE ONE TABLET o BEFORE BREAKFAST (Patient taking differently: Take 25 mg by mouth daily.)    Lancets (ONETOUCH DELICA PLUS [id number]) MISC USE UP TO 4 TIMES DAILY AS DIRECTED    loperamide (IMODIUM) 2 MG capsule Take 2 mg by mouth daily as needed for diarrhea or loose stools.    losartan (COZAAR) 25 MG tablet Take 1 tablet (25 mg total) by mouth at bedtime.    metoprolol tartrate (LOPRESSOR) 100 MG tablet Take  100 mg by mouth 2 (two) times daily. 06/06/2020: Prescribed by Dr. Nehemiah Massed    nitroGLYCERIN (NITROSTAT) 0.4 MG SL tablet Place 1 tablet (0.4 mg total) under the tongue every 5 (five) minutes as needed for chest pain.    nystatin (NYSTATIN) powder Apply 1 application topically daily. (Patient taking differently: Apply 1 application topically daily as needed.)    ondansetron (ZOFRAN-ODT) 8 MG disintegrating tablet Take 1 tablet (8 mg total) by mouth every 8 (eight) hours as needed. (Patient taking differently: Take 8 mg by mouth every 8 (eight) hours as needed for refractory nausea /  vomiting.)    pantoprazole (PROTONIX) 20 MG tablet Take 1 tablet (20 mg total) by mouth 2 (two) times daily.    rosuvastatin (CRESTOR) 40 MG tablet Take 1 tablet (40 mg total) by mouth daily.    sertraline (ZOLOFT) 100 MG tablet Take 1 tablet (100 mg total) by mouth daily.    TRULICITY 1.5 LT/9.0ZE SOPN INJECT 1.5 MG into THE SKIN ONCE A WEEK    [DISCONTINUED] cetirizine (ZYRTEC) 5 MG tablet Take 2 tablets (10 mg total) by mouth daily.    No facility-administered encounter medications on file as of 05/14/2021.    Patient Active Problem List   Diagnosis Date Noted   Vertigo    Unsteady gait    Acute bronchitis 03/07/2019   Esophageal dysphagia    Stomach irritation    Gastric polyp    Esophageal lump    Hx of colonic polyp    Polyp of colon    Diverticulosis of large intestine without diverticulitis    PSVT (paroxysmal supraventricular tachycardia) ([place]) 10/03/2018   Abnormal ECG 07/05/2018   Tachycardia 03/27/2018   Pain in limb 02/28/2018   Chronic diastolic CHF (congestive heart failure) ([place]) 11/03/2017   TIA (transient ischemic attack) 11/03/2017   COPD suggested by initial evaluation ([place]) 08/23/2017   Acute and chronic respiratory failure with hypoxia ([place]) 08/23/2017   Postoperative state 08/15/2017   Incidental pulmonary nodule 08/01/2017   Non-ST elevation myocardial infarction (NSTEMI), subendocardial infarction, subsequent episode of care ([place]) 06/01/2017   B12 deficiency 12/01/2016   Vitamin D deficiency 11/04/2016   Depression with anxiety 09/30/2016   AF (paroxysmal atrial fibrillation) ([place]) 09/30/2016   Resting tremor 09/30/2016   Mild obstructive sleep apnea 09/30/2016   Headache 08/18/2016   Unstable angina ([place]) 08/03/2016   Type 2 diabetes mellitus with diabetic polyneuropathy, with long-term current use of insulin ([place]) 08/03/2016   Chronic daily headache 05/05/2016   Asthma 11/11/2015   S/P CABG x 4 11/10/2015   Coronary artery disease 11/07/2015    Carotid artery narrowing 02/08/2014   Chest pain 02/08/2014   Mixed hyperlipidemia 02/08/2014   Bilateral carotid artery stenosis 02/08/2014   Atypical chest pain 02/07/2014   Essential hypertension 02/07/2014   Awareness of heartbeats 02/07/2014   D (diarrhea) 10/05/2013   Gastroesophageal reflux disease 10/05/2013    Patient Care Plan: RN Care Management Plan of Care     Problem Identified: DM, HF, HTN, A-Fib and DM      Long-Range Goal: Disease Progression Prevented or Minimized   Start Date: 04/29/2021  Expected End Date: 07/28/2021  Priority: High  Note:   Current Barriers:  Chronic Disease Management support and education needs related to Atrial Fibrillation, CHF, HTN, and DMII  RNCM Clinical Goal(s):  Patient will continue to work with RN Care Manager to address care management and care coordination needs related to  Atrial Fibrillation, CHF, HTN, and DMII through  collaboration with Consulting civil engineer, provider, and care team.   Interventions: 1:1 collaboration with primary care provider regarding development and update of comprehensive plan of care as evidenced by provider attestation and co-signature Inter-disciplinary care team collaboration (see longitudinal plan of care) Evaluation of current treatment plan related to  self management and patient's adherence to plan as established by provider   Hypertension Interventions: Reviewed plan for hypertension management. Reports taking medications as prescribed but not monitoring BP consistently. Advised to monitor BP readings and notify the clinic or Cardiology team for readings outside of established parameters. Reviewed complications of uncontrolled blood pressure.  Reviewed s/sx of heart attack, stroke and worsening symptoms that require immediate medical attention.   Diabetes Interventions: Lab Results  Component Value Date   HGBA1C 8.5 (H) 03/26/2021  Reviewed plan for diabetes management. Reports taking medications as  prescribed. Advised to update the CCM Pharmacy team with concerns regarding medication management or prescription cost. Reports monitoring blood glucose levels as advised. Fasting reading today was 133 mg/dl. Reports readings over the past few weeks have ranged from the 130's to 180's.  Reviewed nutritional intake and activity engagement. Advised to continue monitoring intake of carbohydrates and concentrated sugars. Advised to continue to engaging in low impact activity as tolerated. Reviewed pending appointments. Requested assistance with scheduling appointment with PCP. Appointment scheduled for 07/15/2021.    Heart Failure Interventions: Reviewed plan for CHF management. Remains compliant with medications and weight monitoring.  Reviewed s/sx of fluid overload and indications for notifying a provider. Denies abdominal or lower extremity edema. Reports intermittent episodes of chest tightness after activity. Reports not taking nitroglycerin but will take if the symptoms return. Denies episodes of shortness of breath.  Reviewed s/sx of complications r/t CHF exacerbation and indications for seeking immediate medical attention.    Patient Goals/Self-Care Activities: Patient will self administer medications as prescribed Patient will attend all scheduled provider appointments Patient will call pharmacy for medication refills Patient will continue to perform ADL's independently Patient will continue to perform IADL's independently Patient will call provider office for new concerns or questions   Follow Up Plan:   Will follow up next month.         PLAN: A member of the care management team will follow up next month.  [name] Care Management [facility] [phone]

## 2021-05-15 ENCOUNTER — Ambulatory Visit: Payer: Self-pay

## 2021-05-15 DIAGNOSIS — I480 Paroxysmal atrial fibrillation: Secondary | ICD-10-CM

## 2021-05-15 MED ORDER — SERTRALINE HCL 100 MG PO TABS: 100.0000 mg | ORAL_TABLET | Freq: Every day | ORAL | 1 refills | Status: DC

## 2021-05-15 MED ORDER — ROSUVASTATIN CALCIUM 40 MG PO TABS: 40.0000 mg | ORAL_TABLET | Freq: Every day | ORAL | 1 refills | Status: DC

## 2021-05-15 MED ORDER — PANTOPRAZOLE SODIUM 20 MG PO TBEC: 20.0000 mg | DELAYED_RELEASE_TABLET | Freq: Two times a day (BID) | ORAL | 11 refills | Status: AC

## 2021-05-15 NOTE — Chronic Care Management (AMB) (Signed)
Chronic Care Management   CCM RN Visit Note  05/15/2021 Name: Candace [name] MRN: [id number] DOB: [birth year]  Subjective: Candace [name] is a 71 y.o. year old female who is a primary care patient of [name]., MD. The care management team was consulted for assistance with disease management and care coordination needs.    Engaged with patient by telephone for follow up visit in response to provider referral for case management and care coordination services.   Consent to Services:  The patient was given information about Chronic Care Management services, agreed to services, and gave verbal consent prior to initiation of services.  Please see initial visit note for detailed documentation.   Assessment: Review of patient past medical history, allergies, medications, health status, including review of consultants reports, laboratory and other test data, was performed as part of comprehensive evaluation and provision of chronic care management services.   SDOH (Social Determinants of Health) assessments and interventions performed: No  CCM Care Plan  Allergies  Allergen Reactions   Lisinopril Cough   Penicillins Swelling, Rash and Other (See Comments)    Did it involve swelling of the face/tongue/throat, SOB, or low BP? Yes Did it involve sudden or severe rash/hives, skin peeling, or any reaction on the inside of your mouth or nose? Yes Did you need to seek medical attention at a hospital or doctor's office? Yes When did it last happen?      15 years If all above answers are "NO", may proceed with cephalosporin use.     Outpatient Encounter Medications as of 05/15/2021  Medication Sig Note   acetaminophen (TYLENOL) 325 MG tablet Take 650 mg by mouth every 6 (six) hours as needed for moderate pain or headache.     albuterol (VENTOLIN HFA) 108 (90 Base) MCG/ACT inhaler Inhale 2 puffs into the lungs every 6 (six) hours as needed for wheezing or shortness of breath.     bismuth subsalicylate (PEPTO BISMOL) 262 MG chewable tablet Chew 524 mg by mouth daily as needed for diarrhea or loose stools or indigestion.    Blood Glucose Monitoring Suppl (ONETOUCH VERIO) w/Device KIT Use daily to check blood sugar    clopidogrel (PLAVIX) 75 MG tablet Take 1 tablet (75 mg total) by mouth daily.    ELIQUIS 5 MG TABS tablet Take 5 mg by mouth 2 (two) times daily. 06/06/2020: Prescribed by Dr. Nehemiah Massed   ezetimibe (ZETIA) 10 MG tablet Take 1 tablet (10 mg total) by mouth daily.    furosemide (LASIX) 20 MG tablet Take 20 mg by mouth daily.    glucose blood test strip Use to check blood sugars daily as instructed    insulin glargine (LANTUS SOLOSTAR) 100 UNIT/ML Solostar Pen Inject 18 Units into the skin at bedtime.    Insulin Pen Needle 32G X 4 MM MISC Use to inject insulin daily    isosorbide mononitrate (IMDUR) 120 MG 24 hr tablet TAKE 1 TABLET BY MOUTH EVERY DAY    JARDIANCE 25 MG TABS tablet TAKE ONE TABLET o BEFORE BREAKFAST (Patient taking differently: Take 25 mg by mouth daily.)    Lancets (ONETOUCH DELICA PLUS [id number]) MISC USE UP TO 4 TIMES DAILY AS DIRECTED    loperamide (IMODIUM) 2 MG capsule Take 2 mg by mouth daily as needed for diarrhea or loose stools.    losartan (COZAAR) 25 MG tablet Take 1 tablet (25 mg total) by mouth at bedtime.    metoprolol tartrate (LOPRESSOR) 100 MG tablet Take  100 mg by mouth 2 (two) times daily. 06/06/2020: Prescribed by Dr. Nehemiah Massed    nitroGLYCERIN (NITROSTAT) 0.4 MG SL tablet Place 1 tablet (0.4 mg total) under the tongue every 5 (five) minutes as needed for chest pain.    nystatin (NYSTATIN) powder Apply 1 application topically daily. (Patient taking differently: Apply 1 application topically daily as needed.)    ondansetron (ZOFRAN-ODT) 8 MG disintegrating tablet Take 1 tablet (8 mg total) by mouth every 8 (eight) hours as needed. (Patient taking differently: Take 8 mg by mouth every 8 (eight) hours as needed for refractory nausea /  vomiting.)    pantoprazole (PROTONIX) 20 MG tablet Take 1 tablet (20 mg total) by mouth 2 (two) times daily.    rosuvastatin (CRESTOR) 40 MG tablet Take 1 tablet (40 mg total) by mouth daily.    sertraline (ZOLOFT) 100 MG tablet Take 1 tablet (100 mg total) by mouth daily.    TRULICITY 1.5 GO/1.1XB SOPN INJECT 1.5 MG into THE SKIN ONCE A WEEK    [DISCONTINUED] cetirizine (ZYRTEC) 5 MG tablet Take 2 tablets (10 mg total) by mouth daily.    No facility-administered encounter medications on file as of 05/15/2021.    Patient Active Problem List   Diagnosis Date Noted   Vertigo    Unsteady gait    Acute bronchitis 03/07/2019   Esophageal dysphagia    Stomach irritation    Gastric polyp    Esophageal lump    Hx of colonic polyp    Polyp of colon    Diverticulosis of large intestine without diverticulitis    PSVT (paroxysmal supraventricular tachycardia) ([place]) 10/03/2018   Abnormal ECG 07/05/2018   Tachycardia 03/27/2018   Pain in limb 02/28/2018   Chronic diastolic CHF (congestive heart failure) ([place]) 11/03/2017   TIA (transient ischemic attack) 11/03/2017   COPD suggested by initial evaluation ([place]) 08/23/2017   Acute and chronic respiratory failure with hypoxia ([place]) 08/23/2017   Postoperative state 08/15/2017   Incidental pulmonary nodule 08/01/2017   Non-ST elevation myocardial infarction (NSTEMI), subendocardial infarction, subsequent episode of care ([place]) 06/01/2017   B12 deficiency 12/01/2016   Vitamin D deficiency 11/04/2016   Depression with anxiety 09/30/2016   AF (paroxysmal atrial fibrillation) ([place]) 09/30/2016   Resting tremor 09/30/2016   Mild obstructive sleep apnea 09/30/2016   Headache 08/18/2016   Unstable angina ([place]) 08/03/2016   Type 2 diabetes mellitus with diabetic polyneuropathy, with long-term current use of insulin ([place]) 08/03/2016   Chronic daily headache 05/05/2016   Asthma 11/11/2015   S/P CABG x 4 11/10/2015   Coronary artery disease 11/07/2015    Carotid artery narrowing 02/08/2014   Chest pain 02/08/2014   Mixed hyperlipidemia 02/08/2014   Bilateral carotid artery stenosis 02/08/2014   Atypical chest pain 02/07/2014   Essential hypertension 02/07/2014   Awareness of heartbeats 02/07/2014   D (diarrhea) 10/05/2013   Gastroesophageal reflux disease 10/05/2013     Patient Care Plan: RN Care Management Plan of Care     Problem Identified: DM, HF, HTN, A-Fib and DM      Long-Range Goal: Disease Progression Prevented or Minimized   Start Date: 04/29/2021  Expected End Date: 07/28/2021  Priority: High  Note:   Current Barriers:  Chronic Disease Management support and education needs related to Atrial Fibrillation, CHF, HTN, and DMII  RNCM Clinical Goal(s):  Patient will continue to work with RN Care Manager to address care management and care coordination needs related to  Atrial Fibrillation, CHF, HTN, and DMII  through collaboration with Consulting civil engineer, provider, and care team.   Interventions: 1:1 collaboration with primary care provider regarding development and update of comprehensive plan of care as evidenced by provider attestation and co-signature Inter-disciplinary care team collaboration (see longitudinal plan of care) Evaluation of current treatment plan related to  self management and patient's adherence to plan as established by provider   Hypertension Interventions: Reviewed plan for hypertension management. Reports taking medications as prescribed but not monitoring BP consistently. Advised to monitor BP readings and notify the clinic or Cardiology team for readings outside of established parameters. Reviewed complications of uncontrolled blood pressure.  Reviewed s/sx of heart attack, stroke and worsening symptoms that require immediate medical attention.   Diabetes Interventions: Lab Results  Component Value Date   HGBA1C 8.5 (H) 03/26/2021  Reviewed plan for diabetes management. Reports taking medications  as prescribed. Advised to update the CCM Pharmacy team with concerns regarding medication management or prescription cost. Reports monitoring blood glucose levels as advised. Fasting reading today was 133 mg/dl. Reports readings over the past few weeks have ranged from the 130's to 180's.  Reviewed nutritional intake and activity engagement. Advised to continue monitoring intake of carbohydrates and concentrated sugars. Advised to continue to engaging in low impact activity as tolerated. Reviewed pending appointments. Requested assistance with scheduling appointment with PCP. Appointment scheduled for 07/15/2021.    Medication Interventions: Patient called to review and confirm her medication plan. Reports missing a call from the [place] technician earlier today.  Reviewed medications and chart. Patient informed of request submitted to Cardiology to switch Eliquis prescription to [facility]. Patient reports having needed prescriptions. Informed of pending medication delivery on 05/22/21. Agreed to contact the clinic within the next week if she requires additional assistance.    Patient Goals/Self-Care Activities: Patient will self administer medications as prescribed Patient will attend all scheduled provider appointments Patient will call pharmacy for medication refills Patient will continue to perform ADL's independently Patient will continue to perform IADL's independently Patient will call provider office for new concerns or questions   Follow Up Plan:   Will follow up next month.       PLAN A member of the care management team will follow up within the next month.   [name] Care Management [facility] [phone]

## 2021-05-15 NOTE — Progress Notes (Signed)
I reach out to patient Cardiology to request a refill for Eliquis to go to [facility] on 05/15/2021.Per Cardiology, they will send a message to the nurse.   [place] Pharmacist Assistant [phone]

## 2021-05-15 NOTE — Addendum Note (Signed)
Addended by: Daron Offer A on: 05/15/2021 08:33 AM   Modules accepted: Orders

## 2021-05-25 ENCOUNTER — Ambulatory Visit: Payer: 59

## 2021-05-25 DIAGNOSIS — I480 Paroxysmal atrial fibrillation: Secondary | ICD-10-CM

## 2021-05-25 DIAGNOSIS — I200 Unstable angina: Secondary | ICD-10-CM

## 2021-05-25 NOTE — Chronic Care Management (AMB) (Unsigned)
Chronic Care Management   CCM RN Visit Note  05/25/2021 Name: Candace [name] MRN: [id number] DOB: 1950/07/29  Subjective: Candace [name] is a 71 y.o. year old female who is a primary care patient of [name]., MD. The care management team was consulted for assistance with disease management and care coordination needs.    Engaged with patient by telephone for follow up visit in response to provider referral for case management and care coordination services.   Consent to Services:  The patient was given information about Chronic Care Management services, agreed to services, and gave verbal consent prior to initiation of services.  Please see initial visit note for detailed documentation.   Assessment: Review of patient past medical history, allergies, medications, health status, including review of consultants reports, laboratory and other test data, was performed as part of comprehensive evaluation and provision of chronic care management services.   SDOH (Social Determinants of Health) assessments and interventions performed: No  CCM Care Plan  Allergies  Allergen Reactions   Lisinopril Cough   Penicillins Swelling, Rash and Other (See Comments)    Did it involve swelling of the face/tongue/throat, SOB, or low BP? Yes Did it involve sudden or severe rash/hives, skin peeling, or any reaction on the inside of your mouth or nose? Yes Did you need to seek medical attention at a hospital or doctor's office? Yes When did it last happen?      15 years If all above answers are NO, may proceed with cephalosporin use.     Outpatient Encounter Medications as of 05/25/2021  Medication Sig Note   acetaminophen (TYLENOL) 325 MG tablet Take 650 mg by mouth every 6 (six) hours as needed for moderate pain or headache.     albuterol (VENTOLIN HFA) 108 (90 Base) MCG/ACT inhaler Inhale 2 puffs into the lungs every 6 (six) hours as needed for wheezing or shortness of breath.     bismuth subsalicylate (PEPTO BISMOL) 262 MG chewable tablet Chew 524 mg by mouth daily as needed for diarrhea or loose stools or indigestion.    Blood Glucose Monitoring Suppl (ONETOUCH VERIO) w/Device KIT Use daily to check blood sugar    clopidogrel (PLAVIX) 75 MG tablet Take 1 tablet (75 mg total) by mouth daily.    ELIQUIS 5 MG TABS tablet Take 5 mg by mouth 2 (two) times daily. 06/06/2020: Prescribed by Dr. Nehemiah Massed   ezetimibe (ZETIA) 10 MG tablet Take 1 tablet (10 mg total) by mouth daily.    furosemide (LASIX) 20 MG tablet Take 20 mg by mouth daily.    glucose blood test strip Use to check blood sugars daily as instructed    insulin glargine (LANTUS SOLOSTAR) 100 UNIT/ML Solostar Pen Inject 18 Units into the skin at bedtime.    Insulin Pen Needle 32G X 4 MM MISC Use to inject insulin daily    isosorbide mononitrate (IMDUR) 120 MG 24 hr tablet TAKE 1 TABLET BY MOUTH EVERY DAY    JARDIANCE 25 MG TABS tablet TAKE ONE TABLET o BEFORE BREAKFAST (Patient taking differently: Take 25 mg by mouth daily.)    Lancets (ONETOUCH DELICA PLUS [id number]) MISC USE UP TO 4 TIMES DAILY AS DIRECTED    loperamide (IMODIUM) 2 MG capsule Take 2 mg by mouth daily as needed for diarrhea or loose stools.    losartan (COZAAR) 25 MG tablet Take 1 tablet (25 mg total) by mouth at bedtime.    metoprolol tartrate (LOPRESSOR) 100 MG tablet Take  100 mg by mouth 2 (two) times daily. 06/06/2020: Prescribed by Dr. Nehemiah Massed    nitroGLYCERIN (NITROSTAT) 0.4 MG SL tablet Place 1 tablet (0.4 mg total) under the tongue every 5 (five) minutes as needed for chest pain.    nystatin (NYSTATIN) powder Apply 1 application topically daily. (Patient taking differently: Apply 1 application topically daily as needed.)    ondansetron (ZOFRAN-ODT) 8 MG disintegrating tablet Take 1 tablet (8 mg total) by mouth every 8 (eight) hours as needed. (Patient taking differently: Take 8 mg by mouth every 8 (eight) hours as needed for refractory nausea /  vomiting.)    pantoprazole (PROTONIX) 20 MG tablet Take 1 tablet (20 mg total) by mouth 2 (two) times daily.    rosuvastatin (CRESTOR) 40 MG tablet Take 1 tablet (40 mg total) by mouth daily.    sertraline (ZOLOFT) 100 MG tablet Take 1 tablet (100 mg total) by mouth daily.    TRULICITY 1.5 YQ/6.5HQ SOPN INJECT 1.5 MG into THE SKIN ONCE A WEEK    [DISCONTINUED] cetirizine (ZYRTEC) 5 MG tablet Take 2 tablets (10 mg total) by mouth daily.    No facility-administered encounter medications on file as of 05/25/2021.    Patient Active Problem List   Diagnosis Date Noted   Vertigo    Unsteady gait    Acute bronchitis 03/07/2019   Esophageal dysphagia    Stomach irritation    Gastric polyp    Esophageal lump    Hx of colonic polyp    Polyp of colon    Diverticulosis of large intestine without diverticulitis    PSVT (paroxysmal supraventricular tachycardia) ([place]) 10/03/2018   Abnormal ECG 07/05/2018   Tachycardia 03/27/2018   Pain in limb 02/28/2018   Chronic diastolic CHF (congestive heart failure) ([place]) 11/03/2017   TIA (transient ischemic attack) 11/03/2017   COPD suggested by initial evaluation ([place]) 08/23/2017   Acute and chronic respiratory failure with hypoxia ([place]) 08/23/2017   Postoperative state 08/15/2017   Incidental pulmonary nodule 08/01/2017   Non-ST elevation myocardial infarction (NSTEMI), subendocardial infarction, subsequent episode of care ([place]) 06/01/2017   B12 deficiency 12/01/2016   Vitamin D deficiency 11/04/2016   Depression with anxiety 09/30/2016   AF (paroxysmal atrial fibrillation) ([place]) 09/30/2016   Resting tremor 09/30/2016   Mild obstructive sleep apnea 09/30/2016   Headache 08/18/2016   Unstable angina ([place]) 08/03/2016   Type 2 diabetes mellitus with diabetic polyneuropathy, with long-term current use of insulin ([place]) 08/03/2016   Chronic daily headache 05/05/2016   Asthma 11/11/2015   S/P CABG x 4 11/10/2015   Coronary artery disease 11/07/2015    Carotid artery narrowing 02/08/2014   Chest pain 02/08/2014   Mixed hyperlipidemia 02/08/2014   Bilateral carotid artery stenosis 02/08/2014   Atypical chest pain 02/07/2014   Essential hypertension 02/07/2014   Awareness of heartbeats 02/07/2014   D (diarrhea) 10/05/2013   Gastroesophageal reflux disease 10/05/2013    PLAN

## 2021-05-26 DIAGNOSIS — Z794 Long term (current) use of insulin: Secondary | ICD-10-CM

## 2021-05-26 DIAGNOSIS — I5032 Chronic diastolic (congestive) heart failure: Secondary | ICD-10-CM

## 2021-05-26 DIAGNOSIS — I480 Paroxysmal atrial fibrillation: Secondary | ICD-10-CM

## 2021-05-26 DIAGNOSIS — I10 Essential (primary) hypertension: Secondary | ICD-10-CM | POA: Diagnosis not present

## 2021-05-26 DIAGNOSIS — E1142 Type 2 diabetes mellitus with diabetic polyneuropathy: Secondary | ICD-10-CM

## 2021-05-26 DIAGNOSIS — I200 Unstable angina: Secondary | ICD-10-CM

## 2021-06-03 ENCOUNTER — Ambulatory Visit (INDEPENDENT_AMBULATORY_CARE_PROVIDER_SITE_OTHER): Payer: 59

## 2021-06-03 DIAGNOSIS — Z794 Long term (current) use of insulin: Secondary | ICD-10-CM

## 2021-06-03 DIAGNOSIS — I200 Unstable angina: Secondary | ICD-10-CM

## 2021-06-03 DIAGNOSIS — E1142 Type 2 diabetes mellitus with diabetic polyneuropathy: Secondary | ICD-10-CM

## 2021-06-03 NOTE — Chronic Care Management (AMB) (Unsigned)
Chronic Care Management   CCM RN Visit Note  06/03/2021 Name: Candace [name] MRN: [id number] DOB: [birth year]  Subjective: Candace [name] is a 71 y.o. year old female who is a primary care patient of [name]., MD. The care management team was consulted for assistance with disease management and care coordination needs.    in response to provider referral for case management and/or care coordination services.   Consent to Services:  The patient was given information about Chronic Care Management services, agreed to services, and gave verbal consent prior to initiation of services.  Please see initial visit note for detailed documentation.   Patient agreed to services and verbal consent obtained.   Assessment: Review of patient past medical history, allergies, medications, health status, including review of consultants reports, laboratory and other test data, was performed as part of comprehensive evaluation and provision of chronic care management services.   SDOH (Social Determinants of Health) assessments and interventions performed:    CCM Care Plan  Allergies  Allergen Reactions   Lisinopril Cough   Penicillins Swelling, Rash and Other (See Comments)    Did it involve swelling of the face/tongue/throat, SOB, or low BP? Yes Did it involve sudden or severe rash/hives, skin peeling, or any reaction on the inside of your mouth or nose? Yes Did you need to seek medical attention at a hospital or doctor's office? Yes When did it last happen?      15 years If all above answers are NO, may proceed with cephalosporin use.     Outpatient Encounter Medications as of 06/03/2021  Medication Sig Note   acetaminophen (TYLENOL) 325 MG tablet Take 650 mg by mouth every 6 (six) hours as needed for moderate pain or headache.     albuterol (VENTOLIN HFA) 108 (90 Base) MCG/ACT inhaler Inhale 2 puffs into the lungs every 6 (six) hours as needed for wheezing or shortness of breath.     bismuth subsalicylate (PEPTO BISMOL) 262 MG chewable tablet Chew 524 mg by mouth daily as needed for diarrhea or loose stools or indigestion.    Blood Glucose Monitoring Suppl (ONETOUCH VERIO) w/Device KIT Use daily to check blood sugar    clopidogrel (PLAVIX) 75 MG tablet Take 1 tablet (75 mg total) by mouth daily.    ELIQUIS 5 MG TABS tablet Take 5 mg by mouth 2 (two) times daily. 06/06/2020: Prescribed by Dr. Nehemiah Massed   ezetimibe (ZETIA) 10 MG tablet Take 1 tablet (10 mg total) by mouth daily.    furosemide (LASIX) 20 MG tablet Take 20 mg by mouth daily.    glucose blood test strip Use to check blood sugars daily as instructed    insulin glargine (LANTUS SOLOSTAR) 100 UNIT/ML Solostar Pen Inject 18 Units into the skin at bedtime.    Insulin Pen Needle 32G X 4 MM MISC Use to inject insulin daily    isosorbide mononitrate (IMDUR) 120 MG 24 hr tablet TAKE 1 TABLET BY MOUTH EVERY DAY    JARDIANCE 25 MG TABS tablet TAKE ONE TABLET o BEFORE BREAKFAST (Patient taking differently: Take 25 mg by mouth daily.)    Lancets (ONETOUCH DELICA PLUS [id number]) MISC USE UP TO 4 TIMES DAILY AS DIRECTED    loperamide (IMODIUM) 2 MG capsule Take 2 mg by mouth daily as needed for diarrhea or loose stools.    losartan (COZAAR) 25 MG tablet Take 1 tablet (25 mg total) by mouth at bedtime.    metoprolol tartrate (LOPRESSOR) 100 MG  tablet Take 100 mg by mouth 2 (two) times daily. 06/06/2020: Prescribed by Dr. Nehemiah Massed    nitroGLYCERIN (NITROSTAT) 0.4 MG SL tablet Place 1 tablet (0.4 mg total) under the tongue every 5 (five) minutes as needed for chest pain.    nystatin (NYSTATIN) powder Apply 1 application topically daily. (Patient taking differently: Apply 1 application topically daily as needed.)    ondansetron (ZOFRAN-ODT) 8 MG disintegrating tablet Take 1 tablet (8 mg total) by mouth every 8 (eight) hours as needed. (Patient taking differently: Take 8 mg by mouth every 8 (eight) hours as needed for refractory nausea /  vomiting.)    pantoprazole (PROTONIX) 20 MG tablet Take 1 tablet (20 mg total) by mouth 2 (two) times daily.    rosuvastatin (CRESTOR) 40 MG tablet Take 1 tablet (40 mg total) by mouth daily.    sertraline (ZOLOFT) 100 MG tablet Take 1 tablet (100 mg total) by mouth daily.    TRULICITY 1.5 LT/5.3UY SOPN INJECT 1.5 MG into THE SKIN ONCE A WEEK    [DISCONTINUED] cetirizine (ZYRTEC) 5 MG tablet Take 2 tablets (10 mg total) by mouth daily.    No facility-administered encounter medications on file as of 06/03/2021.    Patient Active Problem List   Diagnosis Date Noted   Vertigo    Unsteady gait    Acute bronchitis 03/07/2019   Esophageal dysphagia    Stomach irritation    Gastric polyp    Esophageal lump    Hx of colonic polyp    Polyp of colon    Diverticulosis of large intestine without diverticulitis    PSVT (paroxysmal supraventricular tachycardia) ([place]) 10/03/2018   Abnormal ECG 07/05/2018   Tachycardia 03/27/2018   Pain in limb 02/28/2018   Chronic diastolic CHF (congestive heart failure) ([place]) 11/03/2017   TIA (transient ischemic attack) 11/03/2017   COPD suggested by initial evaluation ([place]) 08/23/2017   Acute and chronic respiratory failure with hypoxia ([place]) 08/23/2017   Postoperative state 08/15/2017   Incidental pulmonary nodule 08/01/2017   Non-ST elevation myocardial infarction (NSTEMI), subendocardial infarction, subsequent episode of care ([place]) 06/01/2017   B12 deficiency 12/01/2016   Vitamin D deficiency 11/04/2016   Depression with anxiety 09/30/2016   AF (paroxysmal atrial fibrillation) ([place]) 09/30/2016   Resting tremor 09/30/2016   Mild obstructive sleep apnea 09/30/2016   Headache 08/18/2016   Unstable angina ([place]) 08/03/2016   Type 2 diabetes mellitus with diabetic polyneuropathy, with long-term current use of insulin ([place]) 08/03/2016   Chronic daily headache 05/05/2016   Asthma 11/11/2015   S/P CABG x 4 11/10/2015   Coronary artery disease 11/07/2015    Carotid artery narrowing 02/08/2014   Chest pain 02/08/2014   Mixed hyperlipidemia 02/08/2014   Bilateral carotid artery stenosis 02/08/2014   Atypical chest pain 02/07/2014   Essential hypertension 02/07/2014   Awareness of heartbeats 02/07/2014   D (diarrhea) 10/05/2013   Gastroesophageal reflux disease 10/05/2013    Plan:

## 2021-06-10 ENCOUNTER — Ambulatory Visit (INDEPENDENT_AMBULATORY_CARE_PROVIDER_SITE_OTHER): Payer: 59 | Admitting: Family Medicine

## 2021-06-10 ENCOUNTER — Other Ambulatory Visit: Payer: Self-pay

## 2021-06-10 ENCOUNTER — Ambulatory Visit
Admission: RE | Admit: 2021-06-10 | Discharge: 2021-06-10 | Disposition: A | Discharge status: Home / Self Care | Payer: 59 | Attending: Family Medicine | Admitting: Family Medicine

## 2021-06-10 ENCOUNTER — Ambulatory Visit
Admission: RE | Admit: 2021-06-10 | Discharge: 2021-06-10 | Disposition: A | Discharge status: Home / Self Care | Payer: 59 | Source: Ambulatory Visit | Attending: Family Medicine | Admitting: Family Medicine

## 2021-06-10 VITALS — BP 105/66 | HR 88 | Temp 97.9°F | Resp 18

## 2021-06-10 DIAGNOSIS — E1142 Type 2 diabetes mellitus with diabetic polyneuropathy: Secondary | ICD-10-CM

## 2021-06-10 DIAGNOSIS — I5032 Chronic diastolic (congestive) heart failure: Secondary | ICD-10-CM | POA: Diagnosis not present

## 2021-06-10 DIAGNOSIS — I200 Unstable angina: Secondary | ICD-10-CM

## 2021-06-10 DIAGNOSIS — R079 Chest pain, unspecified: Secondary | ICD-10-CM

## 2021-06-10 DIAGNOSIS — I10 Essential (primary) hypertension: Secondary | ICD-10-CM

## 2021-06-10 DIAGNOSIS — Z794 Long term (current) use of insulin: Secondary | ICD-10-CM

## 2021-06-10 DIAGNOSIS — I480 Paroxysmal atrial fibrillation: Secondary | ICD-10-CM

## 2021-06-10 DIAGNOSIS — I25110 Atherosclerotic heart disease of native coronary artery with unstable angina pectoris: Secondary | ICD-10-CM | POA: Diagnosis not present

## 2021-06-10 DIAGNOSIS — E782 Mixed hyperlipidemia: Secondary | ICD-10-CM

## 2021-06-10 NOTE — Progress Notes (Signed)
?  ? ? ?Established patient visit ? ?I,[name],acting as a scribe for [name], MD.,have documented all relevant documentation on the behalf of [name], MD,as directed by  [name], MD while in the presence of [name], MD. ? ? ?Patient: Candace [name]   DOB: 1950/10/13   71 y.o. Female  MRN: [id number] ?Visit Date: 06/10/2021 ? ?Today's healthcare provider: [name], MD  ? ?Chief Complaint  ?Patient presents with  ? Chest Pain  ? Nausea  ? ?Subjective  ?  ?HPI  ?Patient comes in today for follow-up of heart disease and chest pain.  She actually went and cleaned up trash and a relatives home a week ago and has had chest pain since then.  Is a left-sided chest and left shoulder.  She has some shortness of breath with this but no other symptoms.  It is not exertional.  No leg swelling no DOE no PND. ?The daughter is trying to find an option that might help the patient, such as heart failure clinic.  She has been through cardiac rehab 3 times. ? ?Medications: ?Outpatient Medications Prior to Visit  ?Medication Sig  ? acetaminophen (TYLENOL) 325 MG tablet Take 650 mg by mouth every 6 (six) hours as needed for moderate pain or headache.   ? albuterol (VENTOLIN HFA) 108 (90 Base) MCG/ACT inhaler Inhale 2 puffs into the lungs every 6 (six) hours as needed for wheezing or shortness of breath.  ? bismuth subsalicylate (PEPTO BISMOL) 262 MG chewable tablet Chew 524 mg by mouth daily as needed for diarrhea or loose stools or indigestion.  ? Blood Glucose Monitoring Suppl (ONETOUCH VERIO) w/Device KIT Use daily to check blood sugar  ? clopidogrel (PLAVIX) 75 MG tablet Take 1 tablet (75 mg total) by mouth daily.  ? ELIQUIS 5 MG TABS tablet Take 5 mg by mouth 2 (two) times daily.  ? ezetimibe (ZETIA) 10 MG tablet Take 1 tablet (10 mg total) by mouth daily.  ? furosemide (LASIX) 20 MG tablet Take 20 mg by mouth daily.  ? glucose blood test strip Use to check blood sugars  daily as instructed  ? insulin glargine (LANTUS SOLOSTAR) 100 UNIT/ML Solostar Pen Inject 18 Units into the skin at bedtime.  ? Insulin Pen Needle 32G X 4 MM MISC Use to inject insulin daily  ? isosorbide mononitrate (IMDUR) 120 MG 24 hr tablet TAKE 1 TABLET BY MOUTH EVERY DAY  ? JARDIANCE 25 MG TABS tablet TAKE ONE TABLET o BEFORE BREAKFAST (Patient taking differently: Take 25 mg by mouth daily.)  ? Lancets (ONETOUCH DELICA PLUS [id number]) MISC USE UP TO 4 TIMES DAILY AS DIRECTED  ? loperamide (IMODIUM) 2 MG capsule Take 2 mg by mouth daily as needed for diarrhea or loose stools.  ? losartan (COZAAR) 25 MG tablet Take 1 tablet (25 mg total) by mouth at bedtime.  ? metoprolol tartrate (LOPRESSOR) 100 MG tablet Take 100 mg by mouth 2 (two) times daily.  ? nitroGLYCERIN (NITROSTAT) 0.4 MG SL tablet Place 1 tablet (0.4 mg total) under the tongue every 5 (five) minutes as needed for chest pain.  ? nystatin (NYSTATIN) powder Apply 1 application topically daily. (Patient taking differently: Apply 1 application topically daily as needed.)  ? ondansetron (ZOFRAN-ODT) 8 MG disintegrating tablet Take 1 tablet (8 mg total) by mouth every 8 (eight) hours as needed. (Patient taking differently: Take 8 mg by mouth every 8 (eight) hours as needed for refractory nausea /  vomiting.)  ? pantoprazole (PROTONIX) 20 MG tablet Take 1 tablet (20 mg total) by mouth 2 (two) times daily.  ? rosuvastatin (CRESTOR) 40 MG tablet Take 1 tablet (40 mg total) by mouth daily.  ? sertraline (ZOLOFT) 100 MG tablet Take 1 tablet (100 mg total) by mouth daily.  ? TRULICITY 1.5 DT/2.6ZT SOPN INJECT 1.5 MG into THE SKIN ONCE A WEEK  ? ?No facility-administered medications prior to visit.  ? ? ?Review of Systems ? ?  ?  Objective  ?  ?BP 105/66 (BP Location: Left Arm, Patient Position: Sitting, Cuff Size: Large)   Pulse 88   Temp 97.9 ?F (36.6 ?C) (Temporal)   Resp 18   SpO2 95%  ?BP Readings from Last 3 Encounters:  ?06/10/21 105/66  ?02/09/21 (!)  148/78  ?12/25/20 114/72  ? ?Wt Readings from Last 3 Encounters:  ?02/09/21 164 lb 8 oz (74.6 kg)  ?12/25/20 164 lb (74.4 kg)  ?10/04/20 167 lb (75.8 kg)  ? ?  ? ?Physical Exam ?Vitals and nursing note reviewed.  ?Constitutional:   ?   Appearance: Normal appearance. She is normal weight.  ?HENT:  ?   Right Ear: Tympanic membrane normal.  ?   Left Ear: Tympanic membrane normal.  ?   Nose: Nose normal.  ?   Mouth/Throat:  ?   Mouth: Mucous membranes are moist.  ?   Pharynx: Oropharynx is clear.  ?Eyes:  ?   Conjunctiva/sclera: Conjunctivae normal.  ?Cardiovascular:  ?   Rate and Rhythm: Normal rate and regular rhythm.  ?   Pulses: Normal pulses.  ?   Heart sounds: Normal heart sounds.  ?Pulmonary:  ?   Effort: Pulmonary effort is normal.  ?   Breath sounds: Normal breath sounds.  ?Abdominal:  ?   General: Bowel sounds are normal.  ?   Palpations: Abdomen is soft.  ?Musculoskeletal:     ?   General: Normal range of motion.  ?   Cervical back: Normal range of motion and neck supple.  ?Skin: ?   General: Skin is warm and dry.  ?Neurological:  ?   Mental Status: She is alert.  ?Psychiatric:     ?   Mood and Affect: Mood normal.     ?   Behavior: Behavior normal.     ?   Thought Content: Thought content normal.     ?   Judgment: Judgment normal.  ?  ?ECG -NSR at 86  with LBBB ? ?No results found for any visits on 06/10/21. ? Assessment & Plan  ?  ? ?1. Chest pain, unspecified type/Unstable angina ?Maximize medical therapy.  Consider reaching out to Dr. Nehemiah Massed. ? ?- EKG 12-Lead ?- CBC w/Diff/Platelet ?- Comprehensive Metabolic Panel (CMET) ?- TSH ?- Troponin T ?- Pro b natriuretic peptide (BNP)[facility]) ?- DG Chest 2 View ? ?2. Type 2 diabetes mellitus with diabetic polyneuropathy, with long-term current use of insulin ([place]) ? ?- CBC w/Diff/Platelet ?- Comprehensive Metabolic Panel (CMET) ?- TSH ?- Troponin T ?- Pro b natriuretic peptide (BNP)[facility] [facility]) ? ?3. Chronic  diastolic CHF (congestive heart failure) ([place]) ?EF of 60% on last cardiology check ?- CBC w/Diff/Platelet ?- Comprehensive Metabolic Panel (CMET) ?- TSH ?- Troponin T ?- Pro b natriuretic peptide (BNP)[facility] [facility]) ? ?4. Coronary artery disease involving native coronary artery of native heart with unstable angina pectoris ([place]) ? ?- CBC w/Diff/Platelet ?- Comprehensive Metabolic Panel (CMET) ?- TSH ?- Troponin T ?-  Pro b natriuretic peptide (BNP)[facility] [facility]) ? ?5. Unstable angina ([facility]) ? ?- CBC w/Diff/Platelet ?- Comprehensive Metabolic Panel (CMET) ?- TSH ?- Troponin T ?- Pro b natriuretic peptide (BNP)[facility] [facility]) ? ?6. Essential hypertension ? ?- CBC w/Diff/Platelet ?- Comprehensive Metabolic Panel (CMET) ?- TSH ?- Troponin T ?- Pro b natriuretic peptide (BNP)[facility] [facility]) ? ?7. Mixed hyperlipidemia ? ?- CBC w/Diff/Platelet ?- Comprehensive Metabolic Panel (CMET) ?- TSH ?- Troponin T ?- Pro b natriuretic peptide (BNP)[facility] [facility]) ? ?8. Paroxysmal A-fib ([place]) ? ?- CBC w/Diff/Platelet ?- Comprehensive Metabolic Panel (CMET) ?- TSH ?- Troponin T ?- Pro b natriuretic peptide (BNP)[facility] [facility]) ? ? ?Return in about 5 weeks (around 07/15/2021).  ?   ? ?I, [name], MD, have reviewed all documentation for this visit. The documentation on 06/13/21 for the exam, diagnosis, procedures, and orders are all accurate and complete. ? ? ? ?Deatrice Spanbauer Cranford Mon, MD  ?[facility] ?[phone] (phone) ?[phone] (fax) ? ?[facility] ?

## 2021-06-11 ENCOUNTER — Ambulatory Visit: Payer: Self-pay

## 2021-06-11 ENCOUNTER — Telehealth: Payer: Self-pay

## 2021-06-11 LAB — CBC WITH DIFFERENTIAL/PLATELET
Basophils Absolute: 0.1 10*3/uL (ref 0.0–0.2)
Basos: 1 %
EOS (ABSOLUTE): 0.1 10*3/uL (ref 0.0–0.4)
Eos: 2 %
Hematocrit: 42 % (ref 34.0–46.6)
Hemoglobin: 13.4 g/dL (ref 11.1–15.9)
Immature Grans (Abs): 0 10*3/uL (ref 0.0–0.1)
Immature Granulocytes: 0 %
Lymphocytes Absolute: 2.9 10*3/uL (ref 0.7–3.1)
Lymphs: 42 %
MCH: 30.9 pg (ref 26.6–33.0)
MCHC: 31.9 g/dL (ref 31.5–35.7)
MCV: 97 fL (ref 79–97)
Monocytes Absolute: 0.4 10*3/uL (ref 0.1–0.9)
Monocytes: 7 %
Neutrophils Absolute: 3.2 10*3/uL (ref 1.4–7.0)
Neutrophils: 48 %
Platelets: 201 10*3/uL (ref 150–450)
RBC: 4.33 x10E6/uL (ref 3.77–5.28)
RDW: 11.8 % (ref 11.7–15.4)
WBC: 6.7 10*3/uL (ref 3.4–10.8)

## 2021-06-11 LAB — COMPREHENSIVE METABOLIC PANEL
ALT: 19 IU/L (ref 0–32)
AST: 23 IU/L (ref 0–40)
Albumin/Globulin Ratio: 1.9 (ref 1.2–2.2)
Albumin: 4.4 g/dL (ref 3.8–4.8)
Alkaline Phosphatase: 146 IU/L — ABNORMAL HIGH (ref 44–121)
BUN/Creatinine Ratio: 18 (ref 12–28)
BUN: 17 mg/dL (ref 8–27)
Bilirubin Total: 0.2 mg/dL (ref 0.0–1.2)
CO2: 24 mmol/L (ref 20–29)
Calcium: 9 mg/dL (ref 8.7–10.3)
Chloride: 107 mmol/L — ABNORMAL HIGH (ref 96–106)
Creatinine, Ser: 0.95 mg/dL (ref 0.57–1.00)
Globulin, Total: 2.3 g/dL (ref 1.5–4.5)
Glucose: 212 mg/dL — ABNORMAL HIGH (ref 70–99)
Potassium: 4.3 mmol/L (ref 3.5–5.2)
Sodium: 143 mmol/L (ref 134–144)
Total Protein: 6.7 g/dL (ref 6.0–8.5)
eGFR: 64 mL/min/{1.73_m2} (ref >59)

## 2021-06-11 LAB — TSH: TSH: 1.32 u[IU]/mL (ref 0.450–4.500)

## 2021-06-11 LAB — TROPONIN T: Troponin T (Highly Sensitive): 12 ng/L (ref 0–14)

## 2021-06-11 LAB — PRO B NATRIURETIC PEPTIDE: NT-Pro BNP: 189 pg/mL (ref 0–301)

## 2021-06-11 NOTE — Patient Instructions (Signed)
Thank you for allowing the Chronic Care Management team to participate in your care.  

## 2021-06-11 NOTE — Progress Notes (Signed)
Chronic Care Management APPOINTMENT REMINDER ? ? ?Called Candace [name], No answer, left message of appointment on 06/12/2021 at 8:30 am  via telephone visit with Junius Argyle , Pharm D. Notified to have all medications, supplements, blood pressure and/or blood sugar logs available during appointment and to return call if need to reschedule. ? ? ? ?Bessie Kellihan,CPA ?Clinical Pharmacist Assistant ?[phone]  ? ? ?

## 2021-06-11 NOTE — Chronic Care Management (AMB) (Signed)
Chronic Care Management   CCM RN Visit Note  06/11/2021 Name: Candace [name] MRN: [id number] DOB: 27-Dec-1950  Subjective: Candace [name] is a 71 y.o. year old female who is a primary care patient of [name]., MD. The care management team was consulted for assistance with disease management and care coordination needs.    Engaged with patient by telephone for follow up visit in response to provider referral for case management and care coordination services.   Consent to Services:  The patient was given information about Chronic Care Management services, agreed to services, and gave verbal consent prior to initiation of services.  Please see initial visit note for detailed documentation.   Assessment: Review of patient past medical history, allergies, medications, health status, including review of consultants reports, laboratory and other test data, was performed as part of comprehensive evaluation and provision of chronic care management services.   SDOH (Social Determinants of Health) assessments and interventions performed: No  CCM Care Plan  Allergies  Allergen Reactions   Lisinopril Cough   Penicillins Swelling, Rash and Other (See Comments)    Did it involve swelling of the face/tongue/throat, SOB, or low BP? Yes Did it involve sudden or severe rash/hives, skin peeling, or any reaction on the inside of your mouth or nose? Yes Did you need to seek medical attention at a hospital or doctor's office? Yes When did it last happen?      15 years If all above answers are "NO", may proceed with cephalosporin use.     Outpatient Encounter Medications as of 06/11/2021  Medication Sig Note   acetaminophen (TYLENOL) 325 MG tablet Take 650 mg by mouth every 6 (six) hours as needed for moderate pain or headache.     albuterol (VENTOLIN HFA) 108 (90 Base) MCG/ACT inhaler Inhale 2 puffs into the lungs every 6 (six) hours as needed for wheezing or shortness of breath.     bismuth subsalicylate (PEPTO BISMOL) 262 MG chewable tablet Chew 524 mg by mouth daily as needed for diarrhea or loose stools or indigestion.    Blood Glucose Monitoring Suppl (ONETOUCH VERIO) w/Device KIT Use daily to check blood sugar    clopidogrel (PLAVIX) 75 MG tablet Take 1 tablet (75 mg total) by mouth daily.    ELIQUIS 5 MG TABS tablet Take 5 mg by mouth 2 (two) times daily. 06/06/2020: Prescribed by Dr. Nehemiah Massed   ezetimibe (ZETIA) 10 MG tablet Take 1 tablet (10 mg total) by mouth daily.    furosemide (LASIX) 20 MG tablet Take 20 mg by mouth daily.    glucose blood test strip Use to check blood sugars daily as instructed    insulin glargine (LANTUS SOLOSTAR) 100 UNIT/ML Solostar Pen Inject 18 Units into the skin at bedtime.    Insulin Pen Needle 32G X 4 MM MISC Use to inject insulin daily    isosorbide mononitrate (IMDUR) 120 MG 24 hr tablet TAKE 1 TABLET BY MOUTH EVERY DAY    JARDIANCE 25 MG TABS tablet TAKE ONE TABLET o BEFORE BREAKFAST (Patient taking differently: Take 25 mg by mouth daily.)    Lancets (ONETOUCH DELICA PLUS [id number]) MISC USE UP TO 4 TIMES DAILY AS DIRECTED    loperamide (IMODIUM) 2 MG capsule Take 2 mg by mouth daily as needed for diarrhea or loose stools.    losartan (COZAAR) 25 MG tablet Take 1 tablet (25 mg total) by mouth at bedtime.    metoprolol tartrate (LOPRESSOR) 100 MG tablet Take  100 mg by mouth 2 (two) times daily. 06/06/2020: Prescribed by Dr. Nehemiah Massed    nitroGLYCERIN (NITROSTAT) 0.4 MG SL tablet Place 1 tablet (0.4 mg total) under the tongue every 5 (five) minutes as needed for chest pain.    nystatin (NYSTATIN) powder Apply 1 application topically daily. (Patient taking differently: Apply 1 application topically daily as needed.)    ondansetron (ZOFRAN-ODT) 8 MG disintegrating tablet Take 1 tablet (8 mg total) by mouth every 8 (eight) hours as needed. (Patient taking differently: Take 8 mg by mouth every 8 (eight) hours as needed for refractory nausea /  vomiting.)    pantoprazole (PROTONIX) 20 MG tablet Take 1 tablet (20 mg total) by mouth 2 (two) times daily.    rosuvastatin (CRESTOR) 40 MG tablet Take 1 tablet (40 mg total) by mouth daily.    sertraline (ZOLOFT) 100 MG tablet Take 1 tablet (100 mg total) by mouth daily.    TRULICITY 1.5 WU/9.8JX SOPN INJECT 1.5 MG into THE SKIN ONCE A WEEK 06/03/2021: Pending Refill   [DISCONTINUED] cetirizine (ZYRTEC) 5 MG tablet Take 2 tablets (10 mg total) by mouth daily.    No facility-administered encounter medications on file as of 06/11/2021.    Patient Active Problem List   Diagnosis Date Noted   Vertigo    Unsteady gait    Acute bronchitis 03/07/2019   Esophageal dysphagia    Stomach irritation    Gastric polyp    Esophageal lump    Hx of colonic polyp    Polyp of colon    Diverticulosis of large intestine without diverticulitis    PSVT (paroxysmal supraventricular tachycardia) ([place]) 10/03/2018   Abnormal ECG 07/05/2018   Tachycardia 03/27/2018   Pain in limb 02/28/2018   Chronic diastolic CHF (congestive heart failure) ([place]) 11/03/2017   TIA (transient ischemic attack) 11/03/2017   COPD suggested by initial evaluation ([place]) 08/23/2017   Acute and chronic respiratory failure with hypoxia ([place]) 08/23/2017   Postoperative state 08/15/2017   Incidental pulmonary nodule 08/01/2017   Non-ST elevation myocardial infarction (NSTEMI), subendocardial infarction, subsequent episode of care ([place]) 06/01/2017   B12 deficiency 12/01/2016   Vitamin D deficiency 11/04/2016   Depression with anxiety 09/30/2016   AF (paroxysmal atrial fibrillation) ([place]) 09/30/2016   Resting tremor 09/30/2016   Mild obstructive sleep apnea 09/30/2016   Headache 08/18/2016   Unstable angina ([place]) 08/03/2016   Type 2 diabetes mellitus with diabetic polyneuropathy, with long-term current use of insulin ([place]) 08/03/2016   Chronic daily headache 05/05/2016   Asthma 11/11/2015   S/P CABG x 4 11/10/2015   Coronary artery  disease 11/07/2015   Carotid artery narrowing 02/08/2014   Chest pain 02/08/2014   Mixed hyperlipidemia 02/08/2014   Bilateral carotid artery stenosis 02/08/2014   Atypical chest pain 02/07/2014   Essential hypertension 02/07/2014   Awareness of heartbeats 02/07/2014   D (diarrhea) 10/05/2013   Gastroesophageal reflux disease 10/05/2013     Patient Care Plan: RN Care Management Plan of Care     Problem Identified: DM, HF, HTN, A-Fib and DM      Long-Range Goal: Disease Progression Prevented or Minimized   Start Date: 04/29/2021  Expected End Date: 07/28/2021  Priority: High  Note:   Current Barriers:  Chronic Disease Management support and education needs related to Atrial Fibrillation, CHF, HTN, and DMII  RNCM Clinical Goal(s):  Patient will continue to work with RN Care Manager to address care management and care coordination needs related to  Atrial Fibrillation, CHF, HTN,  and DMII through collaboration with RN Care manager, provider, and care team.   Interventions: 1:1 collaboration with primary care provider regarding development and update of comprehensive plan of care as evidenced by provider attestation and co-signature Inter-disciplinary care team collaboration (see longitudinal plan of care) Evaluation of current treatment plan related to  self management and patient's adherence to plan as established by provider   Hypertension Interventions: Reviewed plan for hypertension management. Reports taking medications as prescribed but not monitoring BP consistently. Advised to monitor BP readings and notify the clinic or Cardiology team for readings outside of established parameters. Reviewed complications of uncontrolled blood pressure.  Reviewed s/sx of heart attack, stroke and worsening symptoms that require immediate medical attention.   Diabetes Interventions: ( No interventions during this outreach) Lab Results  Component Value Date   HGBA1C 8.5 (H) 03/26/2021   Reviewed plan for diabetes management. Reports taking medications as prescribed. Advised to update the CCM Pharmacy team with concerns regarding medication management or prescription cost. Reports monitoring blood glucose levels as advised. Fasting reading today was 133 mg/dl. Reports readings over the past few weeks have ranged from the 130's to 180's.  Reviewed nutritional intake and activity engagement. Advised to continue monitoring intake of carbohydrates and concentrated sugars. Advised to continue to engaging in low impact activity as tolerated. Reviewed pending appointments. Requested assistance with scheduling appointment with PCP. Appointment scheduled for 07/15/2021.    Medication Interventions: Patient called to review and confirm her medication plan. Reports missing a call from the [facility] technician earlier today.  Reviewed medications and chart. Patient informed of request submitted to Cardiology to switch Eliquis prescription to [facility]. Patient reports having needed prescriptions. Informed of pending medication delivery on 05/22/21. Agreed to contact the clinic within the next week if she requires additional assistance. Update on 05/25/21: Medications were due for delivery on 05/22/21. Patient reports all of the medications were not available as scheduled. The pharmacy will delivery all remaining medications later today. Patient will contact the team if additional assistance is required.  Chest Tightness Interventions: Patient reports episodes of chest tightness over the past week. Denied crushing or radiating chest pain. Denies shortness of breath. Denies changes in activity. Describes symptoms as "quick tightening" that resolves quickly. She has not taken nitroglycerin but agreed to administer if the symptoms return.  Thorough discussion regarding symptoms that require immediate medical attention. Discussed high risk d/t significant cardiac history. Advised to call 911  or have a family member transport her to the nearest Emergency Room if the symptoms worsen. Update 06/11/2021: Reports completing evaluation for recurring chest discomfort. Reports taking nitroglycerin as advised and feeling much better today. Denies episodes of chest discomfort today. Denies palpitations or shortness of breath. Reports completing labs and chest x-ray as prescribed on 06/10/2021.  Discussed plan r/t completing tasks at home. Has very good family support.  Reports currently staying with her daughter until symptoms resolve.  Thorough discussion regarding worsening symptoms and indications for seeking immediate medical attention.    Patient Goals/Self-Care Activities: Patient will self administer medications as prescribed Patient will attend all scheduled provider appointments Patient will call pharmacy for medication refills Patient will continue to perform ADL's independently Patient will continue to perform IADL's independently Patient will call provider office for new concerns or questions   Follow Up Plan:   Will follow up within the next month.       PLAN A member of the care management team will follow up within the next  month.   [name] Care Management [facility] [phone]

## 2021-06-12 ENCOUNTER — Ambulatory Visit: Payer: 59

## 2021-06-12 DIAGNOSIS — E1142 Type 2 diabetes mellitus with diabetic polyneuropathy: Secondary | ICD-10-CM

## 2021-06-12 DIAGNOSIS — E782 Mixed hyperlipidemia: Secondary | ICD-10-CM

## 2021-06-12 NOTE — Progress Notes (Signed)
Chronic Care Management Pharmacy Note  06/25/2021 Name:  Candace [name] MRN:  [id number] DOB:  12-03-1950  Summary: Patient presents for CCM follow-up. Patient blood sugars are slightly above goal, but patient continues to experience relative hypoglycemia symptoms.   Recommendations/Changes made from today's visit: -Continue current medications  -Recheck A1c, fasting lipid panel at PCP follow-up.   Plan: CPP Follow-up in 2 months   Subjective: Candace [name] is an 71 y.o. year old female who is a primary patient of [name]., MD.  The CCM team was consulted for assistance with disease management and care coordination needs.    Engaged with patient by telephone for follow up visit in response to provider referral for pharmacy case management and/or care coordination services.   Consent to Services:  The patient was given information about Chronic Care Management services, agreed to services, and gave verbal consent prior to initiation of services.  Please see initial visit note for detailed documentation.   Patient Care Team: [name]., MD as PCP - General (Family Medicine) Corey Skains, MD as Consulting Physician (Cardiology) Sharlotte Alamo, DPM (Podiatry) Neldon Labella, RN as Case Manager Virgel Manifold, MD (Inactive) as Consulting Physician (Gastroenterology) [place], [place], Glean Salvo, [facility] as Pharmacist (Pharmacist)  Recent office visits: 01/07/21: Patient presented to Dr. Rosanna Randy for ear pain and cough. Albuterol, doxycycline 100 mg twice daily.  12/02/20: Patient presented to Dr. Rosanna Randy for follow-up. Sertraline 100 mg.   Recent consult visits: 03/12/21: Patient presented to Dr. Nehemiah Massed (Cardiology) for follow-up. No medication changes noted.   Hospital visits: 02/08/21: Patient presented to ED for chest pain.   Objective:  Lab Results  Component Value Date   CREATININE 0.95 06/10/2021   BUN 17 06/10/2021    GFRNONAA >60 02/09/2021   GFRAA 108 02/06/2020   NA 143 06/10/2021   K 4.3 06/10/2021   CALCIUM 9.0 06/10/2021   CO2 24 06/10/2021    Lab Results  Component Value Date/Time   HGBA1C 8.5 (H) 03/26/2021 09:07 AM   HGBA1C 10.7 (H) 12/02/2020 04:06 PM   HGBA1C 9.2 07/10/2019 12:00 AM   HGBA1C 8.3 (H) 07/14/2013 05:42 AM   MICROALBUR 50 08/11/2020 08:51 AM    Last diabetic Eye exam:  Lab Results  Component Value Date/Time   HMDIABEYEEXA No Retinopathy 06/28/2019 12:00 AM    Last diabetic Foot exam: No results found for: HMDIABFOOTEX   Lab Results  Component Value Date   CHOL 164 08/11/2020   HDL 57 08/11/2020   LDLCALC 90 08/11/2020   TRIG 94 08/11/2020   CHOLHDL 2.9 08/11/2020       Latest Ref Rng & Units 06/10/2021    4:03 PM 02/09/2021    6:31 AM 02/08/2021    9:09 PM  Hepatic Function  Total Protein 6.0 - 8.5 g/dL 6.7   6.1   6.2    Albumin 3.8 - 4.8 g/dL 4.4   3.4   3.6    AST 0 - 40 IU/L '23   16   18    ' ALT 0 - 32 IU/L '19   11   13    ' Alk Phosphatase 44 - 121 IU/L 146   76   80    Total Bilirubin 0.0 - 1.2 mg/dL 0.2   0.4   0.5    Bilirubin, Direct 0.0 - 0.2 mg/dL   0.1      Lab Results  Component Value Date/Time   TSH  1.320 06/10/2021 04:03 PM   TSH 1.180 01/03/2019 09:57 AM       Latest Ref Rng & Units 06/10/2021    4:03 PM 02/09/2021    6:31 AM 02/08/2021   11:45 AM  CBC  WBC 3.4 - 10.8 x10E3/uL 6.7   6.0   6.7    Hemoglobin 11.1 - 15.9 g/dL 13.4   12.1   13.0    Hematocrit 34.0 - 46.6 % 42.0   38.7   40.8    Platelets 150 - 450 x10E3/uL 201   180   191      Lab Results  Component Value Date/Time   VD25OH 12.2 (L) 10/01/2016 09:35 AM    Clinical ASCVD: Yes  The ASCVD Risk score (Arnett DK, et al., 2019) failed to calculate for the following reasons:   The patient has a prior MI or stroke diagnosis       10/29/2020    8:49 AM 08/05/2020    9:11 AM 04/15/2020    9:37 AM  Depression screen PHQ 2/9  Decreased Interest 0 1 0  Down, Depressed,  Hopeless 0 1 0  PHQ - 2 Score 0 2 0  Altered sleeping 1 1   Tired, decreased energy 1 1   Change in appetite 0 1   Feeling bad or failure about yourself  0 0   Trouble concentrating 0 0   Moving slowly or fidgety/restless 1 0   Suicidal thoughts 0 0   PHQ-9 Score 3 5   Difficult doing work/chores Not difficult at all Not difficult at all     Social History   Tobacco Use  Smoking Status Former   Packs/day: 0.50   Types: Cigarettes   Quit date: 10/07/2001   Years since quitting: 19.7  Smokeless Tobacco Never   BP Readings from Last 3 Encounters:  06/23/21 102/68  06/10/21 105/66  02/09/21 (!) 148/78   Pulse Readings from Last 3 Encounters:  06/23/21 80  06/10/21 88  02/09/21 74   Wt Readings from Last 3 Encounters:  06/23/21 171 lb 8 oz (77.8 kg)  02/09/21 164 lb 8 oz (74.6 kg)  12/25/20 164 lb (74.4 kg)    Assessment/Interventions: Review of patient past medical history, allergies, medications, health status, including review of consultants reports, laboratory and other test data, was performed as part of comprehensive evaluation and provision of chronic care management services.   SDOH:  (Social Determinants of Health) assessments and interventions performed: Yes    CCM Care Plan  Allergies  Allergen Reactions   Lisinopril Cough   Penicillins Swelling, Rash and Other (See Comments)    Did it involve swelling of the face/tongue/throat, SOB, or low BP? Yes Did it involve sudden or severe rash/hives, skin peeling, or any reaction on the inside of your mouth or nose? Yes Did you need to seek medical attention at a hospital or doctor's office? Yes When did it last happen?      15 years If all above answers are "NO", may proceed with cephalosporin use.     Medications Reviewed Today     Reviewed by Alisa Graff, FNP (Family Nurse Practitioner) on 06/23/21 at 1215  Med List Status: <None>   Medication Order Taking? Sig Documenting Provider Last Dose Status  Informant  acetaminophen (TYLENOL) 325 MG tablet [id number] Yes Take 650 mg by mouth every 6 (six) hours as needed for moderate pain or headache.  [provider] Taking Active Self  albuterol (VENTOLIN HFA) 108 (  90 Base) MCG/ACT inhaler [id number] Yes Inhale 2 puffs into the lungs every 6 (six) hours as needed for wheezing or shortness of breath. [name]., MD Taking Active Self  bismuth subsalicylate (PEPTO BISMOL) 262 MG chewable tablet [id number] Yes Chew 524 mg by mouth daily as needed for diarrhea or loose stools or indigestion. [provider] Taking Active Self  Blood Glucose Monitoring Suppl [facility] VERIO) w/Device KIT [id number] Yes Use daily to check blood sugar Brita Romp Dionne Bucy, MD Taking Active     Discontinued 03/06/19 1830   clopidogrel (PLAVIX) 75 MG tablet [id number] Yes Take 1 tablet (75 mg total) by mouth daily. [name]., MD Taking Active Self  ELIQUIS 5 MG TABS tablet [id number] Yes Take 5 mg by mouth 2 (two) times daily. [provider] Taking Active Self           Med Note Michaelle Birks, Glean Salvo   Fri Jun 06, 2020 11:56 AM) Prescribed by Dr. Nehemiah Massed  ezetimibe (ZETIA) 10 MG tablet [id number] Yes Take 1 tablet (10 mg total) by mouth daily. [name]., MD Taking Active Self  furosemide (LASIX) 20 MG tablet [id number] Yes Take 20 mg by mouth daily. [provider] Taking Active Self  glucose blood test strip [id number] Yes Use to check blood sugars daily as instructed [name]., MD Taking Active Self  insulin glargine (LANTUS SOLOSTAR) 100 UNIT/ML Solostar Pen [id number] Yes Inject 18 Units into the skin at bedtime. [name]., MD Taking Active   Insulin Pen Needle 32G X 4 MM MISC [id number] Yes Use to inject insulin daily [name]., MD Taking Active Self  isosorbide mononitrate (IMDUR) 120 MG 24 hr tablet [id number] Yes TAKE 1 TABLET BY MOUTH EVERY DAY [name].,  MD Taking Active   JARDIANCE 25 MG TABS tablet [id number] Yes TAKE ONE TABLET o BEFORE BREAKFAST  Patient taking differently: Take 25 mg by mouth daily.   [name]., MD Taking Active Self  Lancets (ONETOUCH DELICA PLUS MLJQGB20F) [place] [id number] Yes USE UP TO 4 TIMES DAILY AS DIRECTED [name]., MD Taking Active Self  loperamide (IMODIUM) 2 MG capsule [id number] Yes Take 2 mg by mouth daily as needed for diarrhea or loose stools. [provider] Taking Active Self  metoprolol tartrate (LOPRESSOR) 100 MG tablet [id number] Yes Take 100 mg by mouth 2 (two) times daily. [provider] Taking Active Self           Med Note Michaelle Birks, Glean Salvo   Fri Jun 06, 2020 11:56 AM) Prescribed by Dr. Nehemiah Massed   nitroGLYCERIN (NITROSTAT) 0.4 MG SL tablet [id number] Yes Place 1 tablet (0.4 mg total) under the tongue every 5 (five) minutes as needed for chest pain. Bettey Costa, MD Taking Active Self           Med Note Johny Drilling Mar 27, 2018  2:39 PM)    nystatin (NYSTATIN) powder [id number] Yes Apply 1 application topically daily.  Patient taking differently: Apply 1 application. topically daily as needed.   Rubie Maid, MD Taking Active Self  ondansetron (ZOFRAN-ODT) 8 MG disintegrating tablet [id number] Yes Take 1 tablet (8 mg total) by mouth every 8 (eight) hours as needed.  Patient taking differently: Take 8 mg by mouth every 8 (eight) hours as needed for refractory nausea / vomiting.   [name]., MD Taking Active Self  pantoprazole (PROTONIX) 20 MG  tablet [id number] Yes Take 1 tablet (20 mg total) by mouth 2 (two) times daily. [name]., MD Taking Active   rosuvastatin (CRESTOR) 40 MG tablet [id number] Yes Take 1 tablet (40 mg total) by mouth daily. [name]., MD Taking Active   sacubitril-valsartan [facility]) 24-26 [place] [id number] Yes Take 1 tablet by mouth 2 (two) times daily. Darylene Price A, FNP  Active   sertraline  (ZOLOFT) 100 MG tablet [id number] Yes Take 1 tablet (100 mg total) by mouth daily. [name]., MD Taking Active   TRULICITY 1.5 BZ/1.6RC [place] [facility] [id number] Yes INJECT 1.5 MG into THE SKIN ONCE A WEEK [name]., MD Taking Active            Med Note [facility], Jill Side Jun 03, 2021  4:47 PM) Pending Refill            Patient Active Problem List   Diagnosis Date Noted   Vertigo    Unsteady gait    Acute bronchitis 03/07/2019   Esophageal dysphagia    Stomach irritation    Gastric polyp    Esophageal lump    Hx of colonic polyp    Polyp of colon    Diverticulosis of large intestine without diverticulitis    PSVT (paroxysmal supraventricular tachycardia) (HCC) 10/03/2018   Abnormal ECG 07/05/2018   Tachycardia 03/27/2018   Pain in limb 02/28/2018   Chronic diastolic CHF (congestive heart failure) ([place]) 11/03/2017   TIA (transient ischemic attack) 11/03/2017   COPD suggested by initial evaluation ([place]) 08/23/2017   Acute and chronic respiratory failure with hypoxia ([place]) 08/23/2017   Postoperative state 08/15/2017   Incidental pulmonary nodule 08/01/2017   Non-ST elevation myocardial infarction (NSTEMI), subendocardial infarction, subsequent episode of care ([place]) 06/01/2017   B12 deficiency 12/01/2016   Vitamin D deficiency 11/04/2016   Depression with anxiety 09/30/2016   AF (paroxysmal atrial fibrillation) ([place]) 09/30/2016   Resting tremor 09/30/2016   Mild obstructive sleep apnea 09/30/2016   Headache 08/18/2016   Unstable angina ([place]) 08/03/2016   Type 2 diabetes mellitus with diabetic polyneuropathy, with long-term current use of insulin ([place]) 08/03/2016   Chronic daily headache 05/05/2016   Asthma 11/11/2015   S/P CABG x 4 11/10/2015   Coronary artery disease 11/07/2015   Carotid artery narrowing 02/08/2014   Chest pain 02/08/2014   Mixed hyperlipidemia 02/08/2014   Bilateral carotid artery stenosis 02/08/2014   Atypical chest pain 02/07/2014    Essential hypertension 02/07/2014   Awareness of heartbeats 02/07/2014   D (diarrhea) 10/05/2013   Gastroesophageal reflux disease 10/05/2013    Immunization History  Administered Date(s) Administered   Fluad Quad(high Dose 65+) 12/28/2018, 12/25/2020   Influenza, High Dose Seasonal PF 12/24/2016, 12/08/2017   Influenza,inj,Quad PF,6+ Mos 12/19/2015, 01/18/2020   Influenza-Unspecified 03/06/2015   Moderna Sars-Covid-2 Vaccination 04/14/2019, 05/12/2019, 01/28/2020   Pneumococcal Conjugate-13 03/16/2018   Pneumococcal Polysaccharide-23 12/24/2016   Tdap 07/28/2010   Zoster Recombinat (Shingrix) 06/14/2020    Conditions to be addressed/monitored:  Hypertension, Hyperlipidemia, Diabetes, Atrial Fibrillation, Heart Failure, Coronary Artery Disease, GERD, Asthma, Depression and Anxiety  Care Plan : General Pharmacy (Adult)  Updates made by Germaine Pomfret, RPH since 06/25/2021 12:00 AM     Problem: Hypertension, Hyperlipidemia, Diabetes, Atrial Fibrillation, Heart Failure, Coronary Artery Disease, GERD, Asthma, Depression and Anxiety   Priority: High     Long-Range Goal: Patient-Specific Goal   Start Date: 06/06/2020  Expected End Date: 11/25/2021  This Visit's  Progress: On track  Recent Progress: On track  Priority: High  Note:   Current Barriers:  Unable to achieve control of diabetes  Unable to achieve control of cholesterol Does not adhere to prescribed medication regimen  Pharmacist Clinical Goal(s):  Over the next 90 days, patient will achieve control of diabetes as evidenced by A1c less than 8% patient will achieve control of cholesterol as evidenced by LDL less than 70 adhere to prescribed medication regimen as evidenced by utilization of enhanced pharmacy services through collaboration with PharmD and provider.   Interventions: 1:1 collaboration with [name]., MD regarding development and update of comprehensive plan of care as evidenced by  provider attestation and co-signature Inter-disciplinary care team collaboration (see longitudinal plan of care) Comprehensive medication review performed; medication list updated in electronic medical record  Diabetes (A1c goal <7%) -Uncontrolled -Diagnosed 2010 -Current medications:  Trulicity 1.5 mg weekly on Thursdays: Appropriate, Query effective   Jardiance 25 mg daily: Appropriate, Query effective   Lantus 18 units nightly (0.24 u/kg): Appropriate, Query effective   -Medications previously tried: Glipizide, Lantus, Tradjenta, Metformin, Metformin XR, Ozempic   -Current home glucose readings:  -Reports relative hypoglycemic symptoms: Nervous, Jittery. Occurs when blood sugars fall below 120s.  -Patient does endorse rare episodes of nausea. Unsure if related to Trulicity use.  -Current meal patterns: Light meals, mainly salads, yogurt.  -Current exercise: Less exercise due to the weather. Walking 3-4 times weekly (15-20 minutes)   -Given relative hypoglycemia symptoms, will defer intensifying insulin at this time and focus on lifestyle changes.  -Continue current medications   Hyperlipidemia: (LDL goal < 70) -History of CABG x4 2017, 2018  -Uncontrolled -Current treatment: Rosuvastatin 40 mg daily: Appropriate, Query effective  Ezetimibe 10 mg daily: Appropriate, Query effective   -Current antiplatelet treatment: Clopidogrel 75 mg daily  -Medications previously tried: Atorvastatin (changed to rosuvastatin during hospitalization)  -Recommended to continue current medication -Recheck fasting lipid panel at PCP follow-up.   Atrial Fibrillation (Goal: prevent stroke and major bleeding) -Controlled -CHADSVASC: 6 -Current treatment: Rate control: Metoprolol tartrate 100 mg twice daily Anticoagulation: Eliquis 5 mg twice daily -Medications previously tried: NA -Denies symptoms of A-fib -Recommended to continue current medication  Heart Failure (Goal: manage symptoms and  prevent exacerbations) -Controlled -Last ejection fraction: 55-60% (Date: June 2021) -HF type: Diastolic -NYHA Class: III (marked limitation of activity) -AHA HF Stage: C (Heart disease and symptoms present) -Current treatment: Furosemide 20 mg daily Imdur 30 mg daily  Losartan 25 mg daily Metoprolol tartrate 100 mg twice daily -Medications previously tried: NA  -Current home BP/HR readings: 128/70  -Educated on Importance of weighing daily; if you gain more than 3 pounds in one day or 5 pounds in one week, contact provider's office -Recommended to continue current medication  Depression/Anxiety (Goal: maintain stable mood) -Controlled -Current treatment: Sertraline 100 mg daily  -Medications previously tried/failed: NA -PHQ9: 0 -GAD7: 1 - Patient reports she manages her stress by talking with daughter, reading, or medidating  -Continue current medications  GERD with dysphagia (Goal: prevent heartburn or reflux symptoms) -Controlled -Current treatment  Pantoprazole 40 mg daily   -Medications previously tried: Sucralfate,  -Continue current medications  Patient Goals/Self-Care Activities Over the next 90 days, patient will:  - check glucose daily, before breakfast, document, and provide at future appointments check blood pressure weekly, document, and provide at future appointments  Follow Up Plan: Telephone follow up appointment with care management team member scheduled for:  07/27/2021 at 8:30 AM  Medication Assistance: None required.  Patient affirms current coverage meets needs.  Patient's preferred pharmacy is:  [facility] - [place], [place] - [address] [place] [address] Phone: [phone] Fax: [phone]  [id number] [place] [id number]: [phone]: [phone] [id number] [place], [place] [address] [place]: [phone]: [phone] decided to: Utilize [facility] for medication synchronization, packaging and delivery   Care Plan and Follow Up Patient Decision:  Patient agrees to Care Plan and Follow-up.  Plan: Telephone follow up appointment with care management team member scheduled for:  07/27/2021 at 8:30 AM  AlJunius ArgylePharmD, BCPara [phone]

## 2021-06-16 ENCOUNTER — Telehealth: Payer: Self-pay

## 2021-06-16 DIAGNOSIS — R079 Chest pain, unspecified: Secondary | ICD-10-CM

## 2021-06-16 DIAGNOSIS — I5032 Chronic diastolic (congestive) heart failure: Secondary | ICD-10-CM

## 2021-06-16 NOTE — Telephone Encounter (Signed)
Patient aware and referral placed to Heart Failure Clinic ?

## 2021-06-16 NOTE — Telephone Encounter (Signed)
-----   Message from [name]., MD sent at 06/16/2021  8:10 AM EDT ----- ?Labs stable.  I have spoken with Dr. Nehemiah Massed regarding this patient and he agrees referral to heart failure clinic may be beneficial.  Please advise patient.  ?

## 2021-06-22 NOTE — Progress Notes (Signed)
? Patient ID: Candace [name], female    DOB: November 20, 1950, 71 y.o.   MRN: [id number] ? ?HPI ? ?[name] is a 71 y/o female with a history of CAD, DM, hyperlipidemia, HTN, stroke, anemia, anxiety, atrial fibrillation, COPD, depression, GERD, panic attacks, TIA and chronic heart failure.  ? ?Echo report from 06/18/20 reviewed and showed an EF of 60-65% along with moderate LVH and mild MR.  ? ?LHC done 07/11/20 showed: ?1. Severe 3v CAD including 70-80% mid LAD, 60-70% diffuse LCx disease  ?(small vessel) and 90% distal RCA.    ?2. Patent SVG to RCA, Occluded SVG to OM (likely culprit), Atretic LIMA to  ?LAD  ?3. Normal left ventricular filling pressures (LVEDP = 15  mmHg)  ? ?LHC done 08/30/19 and showed: ?LM lesion is 30% stenosed. ?Ost LM to LM lesion is 30% stenosed. ?Ost LM lesion is 40% stenosed. ?Mid LAD-1 lesion is 40% stenosed. ?Mid LAD-2 lesion is 75% stenosed. ?Ost LAD to Prox LAD lesion is 55% stenosed. ?Ost Cx to Mid Cx lesion is 80% stenosed. ?Ost 1st Mrg to 1st Mrg lesion is 75% stenosed. ?Mid RCA lesion is 40% stenosed. ?SVG and is small. ?Non-stenotic Dist Graft to Insertion lesion was previously treated. ?SVG and is small. ?LIMA and is small.  ?Insignificant cad in native vessels with patent lima to lad, patent svg to pda and patent stent in svg to om.  ? ?Admitted 02/08/21 due to intermittent chest pain. Cardiology consult obtained. Discharged the next day.  ? ?She presents today for her initial visit from her PCP referral with a chief complaint of moderate fatigue upon minimal exertion. Describes this as chronic in nature having been present for several months. She has associated chest tightness, shortness of breath, intermittent chest pain, palpitations, abdominal pain (LLQ), light-headedness, easy bruising and difficulty sleeping along with this. She denies any abdominal distention, pedal edema, cough or dysuria.  ? ?She says that when she lays down flat, she gets more SOB with chest tightness and  then has to elevate herself on 2 pillows to alleviate symptoms. Has NTG that she can take but daughter says that she rarely takes it because patient says that she "takes too many pills already".  ? ?Says that she tried ranexa in the past but developed a side effect but she can't remember what happened with it. Daughter says that when patient receives IV fluids, symptoms improve so she feels like patient isn't drinking enough fluids. Patient isn't very sure of exactly how much fluids she drinks. Has scales at home but hasn't been weighing herself daily.  ? ?Past Medical History:  ?Diagnosis Date  ? Allergy   ? Anemia   ? Anxiety   ? Arrhythmia   ? Arthritis   ? Atrial fibrillation ([place])   ? CHF (congestive heart failure) ([place])   ? COPD (chronic obstructive pulmonary disease) ([place])   ? Coronary artery disease   ? Depression   ? Diabetes mellitus without complication ([place])   ? Dyspnea   ? doe  ? Dysrhythmia   ? GERD (gastroesophageal reflux disease)   ? Headache   ? History of hiatal hernia   ? Hyperlipidemia   ? Hypertension   ? Myocardial infarction [facility])   ? 2016, 04/2017  ? Myocardial infarction with cardiac rehabilitation [facility])   ? MI 2016/ CABG 8/17    FINISHED CARDIAC REHAB 3 WEEKS AGO  ? Panic attack   ? Pneumonia   ? Reflux   ? Stroke (  [place]) 2015  ? showed up on MRI; no weakness noted  ? TIA (transient ischemic attack)   ? Voice tremor   ? ?Past Surgical History:  ?Procedure Laterality Date  ? ABDOMINAL HYSTERECTOMY    ? APPENDECTOMY  1975  ? ARTERY BIOPSY Right 04/26/2016  ? Procedure: BIOPSY TEMPORAL ARTERY;  Surgeon: Margaretha Sheffield, MD;  Location: [facility] ORS;  Service: ENT;  Laterality: Right;  ? CARDIAC CATHETERIZATION N/A 11/06/2015  ? Procedure: Left Heart Cath and Coronary Angiography;  Surgeon: Corey Skains, MD;  Location: [place] CV LAB;  Service: Cardiovascular;  Laterality: N/A;  ? CESAREAN SECTION    ? COLONOSCOPY  2015  ? COLONOSCOPY WITH PROPOFOL N/A 11/03/2018  ? Procedure: COLONOSCOPY WITH  PROPOFOL;  Surgeon: Virgel Manifold, MD;  Location: [facility] ENDOSCOPY;  Service: Endoscopy;  Laterality: N/A;  ? CORONARY ANGIOPLASTY  04/2017  ? [place]  ? CORONARY ARTERY BYPASS GRAFT N/A 11/10/2015  ? Procedure: CORONARY ARTERY BYPASS GRAFTING (CABG), ON PUMP, TIMES FOUR, USING LEFT INTERNAL MAMMARY ARTERY, BILATERAL GREATER SAPHENOUS VEINS HARVESTED ENDOSCOPICALLY;  Surgeon: [name], MD;  Location: [place];  Service: Open Heart Surgery;  Laterality: N/A;  LIMA-LAD; SEQ SVG-OM1-OM2; SVG-PL  ? CORONARY STENT INTERVENTION N/A 08/05/2016  ? Procedure: Coronary Stent Intervention;  Surgeon: Isaias Cowman, MD;  Location: [place] CV LAB;  Service: Cardiovascular;  Laterality: N/A;  ? ESOPHAGOGASTRODUODENOSCOPY (EGD) WITH PROPOFOL N/A 11/03/2018  ? Procedure: ESOPHAGOGASTRODUODENOSCOPY (EGD) WITH PROPOFOL;  Surgeon: Virgel Manifold, MD;  Location: [facility] ENDOSCOPY;  Service: Endoscopy;  Laterality: N/A;  ? HYSTERECTOMY ABDOMINAL WITH SALPINGO-OOPHORECTOMY Bilateral 08/15/2017  ? Procedure: HYSTERECTOMY ABDOMINAL WITH BILATERAL SALPINGO-OOPHORECTOMY;  Surgeon: Rubie Maid, MD;  Location: [facility] ORS;  Service: Gynecology;  Laterality: Bilateral;  ? LEFT HEART CATH AND CORONARY ANGIOGRAPHY N/A 08/05/2016  ? Procedure: Left Heart Cath and Coronary Angiography;  Surgeon: Isaias Cowman, MD;  Location: [place] CV LAB;  Service: Cardiovascular;  Laterality: N/A;  ? LEFT HEART CATH AND CORS/GRAFTS ANGIOGRAPHY N/A 09/19/2018  ? Procedure: LEFT HEART CATH AND CORS/GRAFTS ANGIOGRAPHY;  Surgeon: Corey Skains, MD;  Location: [place] CV LAB;  Service: Cardiovascular;  Laterality: N/A;  ? LEFT HEART CATH AND CORS/GRAFTS ANGIOGRAPHY N/A 08/30/2019  ? Procedure: LEFT HEART CATH AND CORS/GRAFTS ANGIOGRAPHY;  Surgeon: Teodoro Spray, MD;  Location: [place] CV LAB;  Service: Cardiovascular;  Laterality: N/A;  ? OOPHORECTOMY    ? TEE WITHOUT CARDIOVERSION N/A 11/10/2015  ?  Procedure: TRANSESOPHAGEAL ECHOCARDIOGRAM (TEE);  Surgeon: [name], MD;  Location: [place];  Service: Open Heart Surgery;  Laterality: N/A;  ? TUBAL LIGATION    ? ?Family History  ?Problem Relation Age of Onset  ? Cancer Father   ? Hypertension Father   ? Heart disease Father   ? Asthma Father   ? Cancer Mother   ? Hypertension Mother   ? Pancreatic cancer Mother 72  ? Cancer Sister   ? Breast cancer Sister 53  ? Breast cancer Sister 28  ? Lung cancer Brother   ? Pancreatic cancer Sister 36  ? Cancer Sister   ? ?Social History  ? ?Tobacco Use  ? Smoking status: Former  ?  Packs/day: 0.50  ?  Types: Cigarettes  ?  Quit date: 10/07/2001  ?  Years since quitting: 19.7  ? Smokeless tobacco: Never  ?Substance Use Topics  ? Alcohol use: No  ?  Alcohol/week: 0.0 standard drinks  ? ?Allergies  ?Allergen Reactions  ?  Lisinopril Cough  ? Penicillins Swelling, Rash and Other (See Comments)  ?  Did it involve swelling of the face/tongue/throat, SOB, or low BP? Yes ?Did it involve sudden or severe rash/hives, skin peeling, or any reaction on the inside of your mouth or nose? Yes ?Did you need to seek medical attention at a hospital or doctor's office? Yes ?When did it last happen?      15 years ?If all above answers are ?NO?, may proceed with cephalosporin use. ?  ? ?Prior to Admission medications   ?Medication Sig Start Date End Date Taking? Authorizing Provider  ?acetaminophen (TYLENOL) 325 MG tablet Take 650 mg by mouth every 6 (six) hours as needed for moderate pain or headache.    Yes [provider]  ?albuterol (VENTOLIN HFA) 108 (90 Base) MCG/ACT inhaler Inhale 2 puffs into the lungs every 6 (six) hours as needed for wheezing or shortness of breath. 01/07/21  Yes [name]., MD  ?bismuth subsalicylate (PEPTO BISMOL) 262 MG chewable tablet Chew 524 mg by mouth daily as needed for diarrhea or loose stools or indigestion.   Yes [provider]  ?Blood Glucose Monitoring Suppl (ONETOUCH  VERIO) w/Device KIT Use daily to check blood sugar 03/05/21  Yes Bacigalupo, Dionne Bucy, MD  ?clopidogrel (PLAVIX) 75 MG tablet Take 1 tablet (75 mg total) by mouth daily. 09/05/20  Yes [name]., MD  ?Geryl Rankins

## 2021-06-23 ENCOUNTER — Other Ambulatory Visit: Payer: Self-pay

## 2021-06-23 ENCOUNTER — Ambulatory Visit: Payer: 59 | Attending: Family | Admitting: Family

## 2021-06-23 ENCOUNTER — Encounter: Payer: Self-pay | Admitting: Family

## 2021-06-23 ENCOUNTER — Ambulatory Visit: Payer: Self-pay

## 2021-06-23 VITALS — BP 102/68 | HR 80 | Resp 18 | Ht 62.0 in | Wt 171.5 lb

## 2021-06-23 DIAGNOSIS — E785 Hyperlipidemia, unspecified: Secondary | ICD-10-CM | POA: Diagnosis not present

## 2021-06-23 DIAGNOSIS — D649 Anemia, unspecified: Secondary | ICD-10-CM | POA: Diagnosis not present

## 2021-06-23 DIAGNOSIS — I480 Paroxysmal atrial fibrillation: Secondary | ICD-10-CM | POA: Insufficient documentation

## 2021-06-23 DIAGNOSIS — K219 Gastro-esophageal reflux disease without esophagitis: Secondary | ICD-10-CM | POA: Insufficient documentation

## 2021-06-23 DIAGNOSIS — E119 Type 2 diabetes mellitus without complications: Secondary | ICD-10-CM | POA: Diagnosis not present

## 2021-06-23 DIAGNOSIS — Z8673 Personal history of transient ischemic attack (TIA), and cerebral infarction without residual deficits: Secondary | ICD-10-CM | POA: Insufficient documentation

## 2021-06-23 DIAGNOSIS — Z794 Long term (current) use of insulin: Secondary | ICD-10-CM

## 2021-06-23 DIAGNOSIS — J449 Chronic obstructive pulmonary disease, unspecified: Secondary | ICD-10-CM | POA: Insufficient documentation

## 2021-06-23 DIAGNOSIS — I5032 Chronic diastolic (congestive) heart failure: Secondary | ICD-10-CM | POA: Insufficient documentation

## 2021-06-23 DIAGNOSIS — F32A Depression, unspecified: Secondary | ICD-10-CM | POA: Diagnosis not present

## 2021-06-23 DIAGNOSIS — N182 Chronic kidney disease, stage 2 (mild): Secondary | ICD-10-CM

## 2021-06-23 DIAGNOSIS — F419 Anxiety disorder, unspecified: Secondary | ICD-10-CM | POA: Diagnosis not present

## 2021-06-23 DIAGNOSIS — I110 Hypertensive heart disease with heart failure: Secondary | ICD-10-CM | POA: Insufficient documentation

## 2021-06-23 DIAGNOSIS — I2510 Atherosclerotic heart disease of native coronary artery without angina pectoris: Secondary | ICD-10-CM | POA: Diagnosis not present

## 2021-06-23 DIAGNOSIS — I10 Essential (primary) hypertension: Secondary | ICD-10-CM

## 2021-06-23 DIAGNOSIS — E1122 Type 2 diabetes mellitus with diabetic chronic kidney disease: Secondary | ICD-10-CM

## 2021-06-23 DIAGNOSIS — I200 Unstable angina: Secondary | ICD-10-CM

## 2021-06-23 DIAGNOSIS — F410 Panic disorder [episodic paroxysmal anxiety] without agoraphobia: Secondary | ICD-10-CM | POA: Diagnosis not present

## 2021-06-23 MED ORDER — SACUBITRIL-VALSARTAN 24-26 MG PO TABS: 1.0000 | ORAL_TABLET | Freq: Two times a day (BID) | ORAL | 3 refills | Status: DC

## 2021-06-23 NOTE — Chronic Care Management (AMB) (Signed)
Chronic Care Management   CCM RN Visit Note  06/23/2021 Name: Candace [name] MRN: [id number] DOB: 1950-12-04  Subjective: Candace [name] is a 71 y.o. year old female who is a primary care patient of Maple Hudson., MD. The care management team was consulted for assistance with disease management and care coordination needs.    Engaged with patient by telephone for follow up visit in response to provider referral for case management and care coordination services.   Consent to Services:  The patient was given information about Chronic Care Management services, agreed to services, and gave verbal consent prior to initiation of services.  Please see initial visit note for detailed documentation.   Assessment: Review of patient past medical history, allergies, medications, health status, including review of consultants reports, laboratory and other test data, was performed as part of comprehensive evaluation and provision of chronic care management services.   SDOH (Social Determinants of Health) assessments and interventions performed: No  CCM Care Plan  Allergies  Allergen Reactions   Lisinopril Cough   Penicillins Swelling, Rash and Other (See Comments)    Did it involve swelling of the face/tongue/throat, SOB, or low BP? Yes Did it involve sudden or severe rash/hives, skin peeling, or any reaction on the inside of your mouth or nose? Yes Did you need to seek medical attention at a hospital or doctor's office? Yes When did it last happen?      15 years If all above answers are "NO", may proceed with cephalosporin use.     Outpatient Encounter Medications as of 06/23/2021  Medication Sig Note   acetaminophen (TYLENOL) 325 MG tablet Take 650 mg by mouth every 6 (six) hours as needed for moderate pain or headache.     albuterol (VENTOLIN HFA) 108 (90 Base) MCG/ACT inhaler Inhale 2 puffs into the lungs every 6 (six) hours as needed for wheezing or shortness of breath.     bismuth subsalicylate (PEPTO BISMOL) 262 MG chewable tablet Chew 524 mg by mouth daily as needed for diarrhea or loose stools or indigestion.    Blood Glucose Monitoring Suppl (ONETOUCH VERIO) w/Device KIT Use daily to check blood sugar    clopidogrel (PLAVIX) 75 MG tablet Take 1 tablet (75 mg total) by mouth daily.    ELIQUIS 5 MG TABS tablet Take 5 mg by mouth 2 (two) times daily. 06/06/2020: Prescribed by Dr. Gwen Pounds   ezetimibe (ZETIA) 10 MG tablet Take 1 tablet (10 mg total) by mouth daily.    furosemide (LASIX) 20 MG tablet Take 20 mg by mouth daily.    glucose blood test strip Use to check blood sugars daily as instructed    insulin glargine (LANTUS SOLOSTAR) 100 UNIT/ML Solostar Pen Inject 18 Units into the skin at bedtime.    Insulin Pen Needle 32G X 4 MM MISC Use to inject insulin daily    isosorbide mononitrate (IMDUR) 120 MG 24 hr tablet TAKE 1 TABLET BY MOUTH EVERY DAY    JARDIANCE 25 MG TABS tablet TAKE ONE TABLET o BEFORE BREAKFAST (Patient taking differently: Take 25 mg by mouth daily.)    Lancets (ONETOUCH DELICA PLUS LANCET33G) MISC USE UP TO 4 TIMES DAILY AS DIRECTED    loperamide (IMODIUM) 2 MG capsule Take 2 mg by mouth daily as needed for diarrhea or loose stools.    metoprolol tartrate (LOPRESSOR) 100 MG tablet Take 100 mg by mouth 2 (two) times daily. 06/06/2020: Prescribed by Dr. Gwen Pounds    nitroGLYCERIN Jearld Shines)  0.4 MG SL tablet Place 1 tablet (0.4 mg total) under the tongue every 5 (five) minutes as needed for chest pain.    nystatin (NYSTATIN) powder Apply 1 application topically daily. (Patient taking differently: Apply 1 application. topically daily as needed.)    ondansetron (ZOFRAN-ODT) 8 MG disintegrating tablet Take 1 tablet (8 mg total) by mouth every 8 (eight) hours as needed. (Patient taking differently: Take 8 mg by mouth every 8 (eight) hours as needed for refractory nausea / vomiting.)    pantoprazole (PROTONIX) 20 MG tablet Take 1 tablet (20 mg total) by  mouth 2 (two) times daily.    rosuvastatin (CRESTOR) 40 MG tablet Take 1 tablet (40 mg total) by mouth daily.    sacubitril-valsartan (ENTRESTO) 24-26 MG Take 1 tablet by mouth 2 (two) times daily.    sertraline (ZOLOFT) 100 MG tablet Take 1 tablet (100 mg total) by mouth daily.    TRULICITY 1.5 MG/0.5ML SOPN INJECT 1.5 MG into THE SKIN ONCE A WEEK 06/03/2021: Pending Refill   [DISCONTINUED] cetirizine (ZYRTEC) 5 MG tablet Take 2 tablets (10 mg total) by mouth daily.    No facility-administered encounter medications on file as of 06/23/2021.    Patient Active Problem List   Diagnosis Date Noted   Vertigo    Unsteady gait    Acute bronchitis 03/07/2019   Esophageal dysphagia    Stomach irritation    Gastric polyp    Esophageal lump    Hx of colonic polyp    Polyp of colon    Diverticulosis of large intestine without diverticulitis    PSVT (paroxysmal supraventricular tachycardia) (HCC) 10/03/2018   Abnormal ECG 07/05/2018   Tachycardia 03/27/2018   Pain in limb 02/28/2018   Chronic diastolic CHF (congestive heart failure) (HCC) 11/03/2017   TIA (transient ischemic attack) 11/03/2017   COPD suggested by initial evaluation (HCC) 08/23/2017   Acute and chronic respiratory failure with hypoxia (HCC) 08/23/2017   Postoperative state 08/15/2017   Incidental pulmonary nodule 08/01/2017   Non-ST elevation myocardial infarction (NSTEMI), subendocardial infarction, subsequent episode of care (HCC) 06/01/2017   B12 deficiency 12/01/2016   Vitamin D deficiency 11/04/2016   Depression with anxiety 09/30/2016   AF (paroxysmal atrial fibrillation) (HCC) 09/30/2016   Resting tremor 09/30/2016   Mild obstructive sleep apnea 09/30/2016   Headache 08/18/2016   Unstable angina (HCC) 08/03/2016   Type 2 diabetes mellitus with diabetic polyneuropathy, with long-term current use of insulin (HCC) 08/03/2016   Chronic daily headache 05/05/2016   Asthma 11/11/2015   S/P CABG x 4 11/10/2015   Coronary  artery disease 11/07/2015   Carotid artery narrowing 02/08/2014   Chest pain 02/08/2014   Mixed hyperlipidemia 02/08/2014   Bilateral carotid artery stenosis 02/08/2014   Atypical chest pain 02/07/2014   Essential hypertension 02/07/2014   Awareness of heartbeats 02/07/2014   D (diarrhea) 10/05/2013   Gastroesophageal reflux disease 10/05/2013     Patient Care Plan: RN Care Management Plan of Care     Problem Identified: DM, HF, HTN, A-Fib and DM      Long-Range Goal: Disease Progression Prevented or Minimized   Start Date: 04/29/2021  Expected End Date: 07/28/2021  Priority: High  Note:   Current Barriers:  Chronic Disease Management support and education needs related to Atrial Fibrillation, CHF, HTN, and DMII  RNCM Clinical Goal(s):  Patient will continue to work with RN Care Manager to address care management and care coordination needs related to  Atrial Fibrillation, CHF, HTN, and DMII  through collaboration with Medical illustrator, provider, and care team.   Interventions: 1:1 collaboration with primary care provider regarding development and update of comprehensive plan of care as evidenced by provider attestation and co-signature Inter-disciplinary care team collaboration (see longitudinal plan of care) Evaluation of current treatment plan related to  self management and patient's adherence to plan as established by provider   Hypertension Interventions: Reviewed plan for hypertension management. Reports taking medications as prescribed but not monitoring BP consistently. Advised to monitor BP readings and notify the clinic or Cardiology team for readings outside of established parameters. Reviewed complications of uncontrolled blood pressure.  Reviewed s/sx of heart attack, stroke and worsening symptoms that require immediate medical attention.   Diabetes Interventions: ( No interventions during this outreach) Lab Results  Component Value Date   HGBA1C 8.5 (H)  03/26/2021  Reviewed plan for diabetes management. Reports taking medications as prescribed. Advised to update the CCM Pharmacy team with concerns regarding medication management or prescription cost. Reports monitoring blood glucose levels as advised. Fasting reading today was 133 mg/dl. Reports readings over the past few weeks have ranged from the 130's to 180's.  Reviewed nutritional intake and activity engagement. Advised to continue monitoring intake of carbohydrates and concentrated sugars. Advised to continue to engaging in low impact activity as tolerated. Reviewed pending appointments. Requested assistance with scheduling appointment with PCP. Appointment scheduled for 07/15/2021.    Medication Interventions: Patient called to review and confirm her medication plan. Reports missing a call from the [facility] technician earlier today.  Reviewed medications and chart. Patient informed of request submitted to Cardiology to switch Eliquis prescription to [facility]. Patient reports having needed prescriptions. Informed of pending medication delivery on 05/22/21. Agreed to contact the clinic within the next week if she requires additional assistance. Update on 05/25/21: Medications were due for delivery on 05/22/21. Patient reports all of the medications were not available as scheduled. The pharmacy will delivery all remaining medications later today. Patient will contact the team if additional assistance is required.  Chest Tightness Interventions: Patient reports episodes of chest tightness over the past week. Denied crushing or radiating chest pain. Denies shortness of breath. Denies changes in activity. Describes symptoms as "quick tightening" that resolves quickly. She has not taken nitroglycerin but agreed to administer if the symptoms return.  Thorough discussion regarding symptoms that require immediate medical attention. Discussed high risk d/t significant cardiac history. Advised  to call 911 or have a family member transport her to the nearest Emergency Room if the symptoms worsen. Update 06/11/2021: Reports completing evaluation for recurring chest discomfort. Reports taking nitroglycerin as advised and feeling much better today. Denies episodes of chest discomfort today. Denies palpitations or shortness of breath. Reports completing labs and chest x-ray as prescribed on 06/10/2021.  Discussed plan r/t completing tasks at home. Has very good family support.  Reports currently staying with her daughter until symptoms resolve.  Thorough discussion regarding worsening symptoms and indications for seeking immediate medical attention.    Patient Goals/Self-Care Activities: Patient will self administer medications as prescribed Patient will attend all scheduled provider appointments Patient will call pharmacy for medication refills Patient will continue to perform ADL's independently Patient will continue to perform IADL's independently Patient will call provider office for new concerns or questions   Follow Up Plan:   Will follow up within the next month.       PLAN A member of the care management team will follow up within the next month.  [facility] [phone]

## 2021-06-23 NOTE — Patient Instructions (Addendum)
Start weighing daily and call for an overnight weight gain of 3 pounds or more or a weekly weight gain of more than 5 pounds. ? ? ?If you have voicemail, please make sure your mailbox is cleaned out so that we may leave a message and please make sure to listen to any voicemails.  ? ? ?Drink between 60-64 ounces of fluids daily ? ? ?Stop taking losartan (cozaar) and begin entresto 24/'26mg'$  as 1 tablet in the morning and 1 tablet in the evening.  ? ? ?

## 2021-06-25 NOTE — Patient Instructions (Signed)
Visit Information ?It was great speaking with you today!  Please let me know if you have any questions about our visit. ? ? Goals Addressed   ? ?  ?  ?  ?  ? This Visit's Progress  ?  Monitor and Manage My Blood Sugar-Diabetes Type 2   On track  ?  Timeframe:  Long-Range Goal ?Priority:  High ?Start Date: 04/15/20      ?Expected End Date: 02/26/22 ?                 ? ?Follow Up within 30 days ?  ?- Check blood sugar daily before breakfast ?- Check blood sugar if I feel it is too high or too low ?- Enter blood sugar readings and medication or insulin into daily log ?- Take the blood sugar log to all doctor visits ?- Take the blood sugar meter to all doctor visits  ?  ?  ? ?  ? ?Patient Care Plan: General Pharmacy (Adult)  ?  ? ?Problem Identified: Hypertension, Hyperlipidemia, Diabetes, Atrial Fibrillation, Heart Failure, Coronary Artery Disease, GERD, Asthma, Depression and Anxiety   ?Priority: High  ?  ? ?Long-Range Goal: Patient-Specific Goal   ?Start Date: 06/06/2020  ?Expected End Date: 11/25/2021  ?This Visit's Progress: On track  ?Recent Progress: On track  ?Priority: High  ?Note:   ?Current Barriers:  ?Unable to achieve control of diabetes  ?Unable to achieve control of cholesterol ?Does not adhere to prescribed medication regimen ? ?Pharmacist Clinical Goal(s):  ?Over the next 90 days, patient will achieve control of diabetes as evidenced by A1c less than 8% ?patient will achieve control of cholesterol as evidenced by LDL less than 70 ?adhere to prescribed medication regimen as evidenced by utilization of enhanced pharmacy services through collaboration with PharmD and provider.  ? ?Interventions: ?1:1 collaboration with [name]., MD regarding development and update of comprehensive plan of care as evidenced by provider attestation and co-signature ?Inter-disciplinary care team collaboration (see longitudinal plan of care) ?Comprehensive medication review performed; medication list updated in  electronic medical record ? ?Diabetes (A1c goal <7%) ?-Uncontrolled ?-Diagnosed 2010 ?-Current medications:  ?Trulicity 1.5 mg weekly on Thursdays: Appropriate, Query effective   ?Jardiance 25 mg daily: Appropriate, Query effective   ?Lantus 18 units nightly (0.24 u/kg): Appropriate, Query effective   ?-Medications previously tried: Glipizide, Lantus, Tradjenta, Metformin, Metformin XR, Ozempic   ?-Current home glucose readings: ? ?-Reports relative hypoglycemic symptoms: Nervous, Jittery. Occurs when blood sugars fall below 120s.  ?-Patient does endorse rare episodes of nausea. Unsure if related to Trulicity use.  ?-Current meal patterns: Light meals, mainly salads, yogurt.  ?-Current exercise: Less exercise due to the weather. Walking 3-4 times weekly (15-20 minutes)   ?-Given relative hypoglycemia symptoms, will defer intensifying insulin at this time and focus on lifestyle changes.  ?-Continue current medications ? ? ?Hyperlipidemia: (LDL goal < 70) ?-History of CABG x4 2017, 2018  ?-Uncontrolled ?-Current treatment: ?Rosuvastatin 40 mg daily: Appropriate, Query effective  ?Ezetimibe 10 mg daily: Appropriate, Query effective   ?-Current antiplatelet treatment: ?Clopidogrel 75 mg daily  ?-Medications previously tried: Atorvastatin (changed to rosuvastatin during hospitalization)  ?-Recommended to continue current medication ?-Recheck fasting lipid panel at PCP follow-up.  ? ?Atrial Fibrillation (Goal: prevent stroke and major bleeding) ?-Controlled ?-CHADSVASC: 6 ?-Current treatment: ?Rate control: Metoprolol tartrate 100 mg twice daily ?Anticoagulation: Eliquis 5 mg twice daily ?-Medications previously tried: NA ?-Denies symptoms of A-fib ?-Recommended to continue current medication ? ?Heart Failure (Goal:  manage symptoms and prevent exacerbations) ?-Controlled ?-Last ejection fraction: 55-60% (Date: June 2021) ?-HF type: Diastolic ?-NYHA Class: III (marked limitation of activity) ?-AHA HF Stage: C (Heart  disease and symptoms present) ?-Current treatment: ?Furosemide 20 mg daily ?Imdur 30 mg daily  ?Losartan 25 mg daily ?Metoprolol tartrate 100 mg twice daily ?-Medications previously tried: NA  ?-Current home BP/HR readings: 128/70  ?-Educated on Importance of weighing daily; if you gain more than 3 pounds in one day or 5 pounds in one week, contact provider's office ?-Recommended to continue current medication ? ?Depression/Anxiety (Goal: maintain stable mood) ?-Controlled ?-Current treatment: ?Sertraline 100 mg daily  ?-Medications previously tried/failed: NA ?-PHQ9: 0 ?-GAD7: 1 ?- Patient reports she manages her stress by talking with daughter, reading, or medidating  ?-Continue current medications ? ?GERD with dysphagia (Goal: prevent heartburn or reflux symptoms) ?-Controlled ?-Current treatment  ?Pantoprazole 40 mg daily   ?-Medications previously tried: Sucralfate,  ?-Continue current medications ? ?Patient Goals/Self-Care Activities ?Over the next 90 days, patient will:  ?- check glucose daily, before breakfast, document, and provide at future appointments ?check blood pressure weekly, document, and provide at future appointments ? ?Follow Up Plan: Telephone follow up appointment with care management team member scheduled for:  07/27/2021 at 8:30 AM ?  ? ?Patient agreed to services and verbal consent obtained.  ? ?The patient verbalized understanding of instructions, educational materials, and care plan provided today and declined offer to receive copy of patient instructions, educational materials, and care plan.  ? ?Junius Argyle, PharmD, BCACP, CPP  ?Clinical Pharmacist Practitioner  ?[place] ?[phone]  ?

## 2021-06-26 ENCOUNTER — Ambulatory Visit: Payer: Self-pay

## 2021-06-26 DIAGNOSIS — E1142 Type 2 diabetes mellitus with diabetic polyneuropathy: Secondary | ICD-10-CM

## 2021-06-26 DIAGNOSIS — E782 Mixed hyperlipidemia: Secondary | ICD-10-CM | POA: Diagnosis not present

## 2021-06-26 DIAGNOSIS — Z794 Long term (current) use of insulin: Secondary | ICD-10-CM

## 2021-06-26 DIAGNOSIS — I5032 Chronic diastolic (congestive) heart failure: Secondary | ICD-10-CM

## 2021-06-26 DIAGNOSIS — M79605 Pain in left leg: Secondary | ICD-10-CM

## 2021-06-26 DIAGNOSIS — I200 Unstable angina: Secondary | ICD-10-CM

## 2021-06-26 NOTE — Telephone Encounter (Signed)
?  Chief Complaint: leg pain ?Symptoms: leg pain in both legs but L leg is worse and goes numb ?Frequency: 3-4 weeks ?Pertinent Negatives: NA ?Disposition: '[]'$ ED /'[]'$ Urgent Care (no appt availability in office) / '[x]'$ Appointment(In office/virtual)/ '[]'$  [place] [facility]/ '[]'$ Home Care/ '[]'$ Refused Recommended Disposition /'[]'$ [place] [facility]/ '[]'$  Follow-up with PCP ?Additional Notes: pt states this pain is causing her difficulty with walking and she is taking Tylenol Arthritis '650mg'$  2 tabs 4x a day. I advised pt with the amount of tylenol she is taking and the pain going on for the amount of time to come in and be seen. Pt has appt on 07/15/21 with PCP but I advised her to be seen sooner. Pt agreed and was ok with scheduling.  ? ? ?Summary: pain in joints and getting harder to walk/med ?  ? Gab at [facility] called in for pt, says has severe pain in joints and getting harder to wald and wants to know what med is safe to take with her heart condition. Please call back   ?  ? ?Reason for Disposition ? Numbness in a leg or foot (i.e., loss of sensation) ? ?Answer Assessment - Initial Assessment Questions ?1. ONSET: "When did the pain start?"  ?    3 or 4 weeks ?2. LOCATION: "Where is the pain located?"  ?    Thigh area  ?3. PAIN: "How bad is the pain?"    (Scale 1-10; or mild, moderate, severe) ?  -  MILD (1-3): doesn't interfere with normal activities  ?  -  MODERATE (4-7): interferes with normal activities (e.g., work or school) or awakens from sleep, limping  ?  -  SEVERE (8-10): excruciating pain, unable to do any normal activities, unable to walk ?    8 ?6. OTHER SYMPTOMS: "Do you have any other symptoms?" (e.g., chest pain, back pain, breathing difficulty, swelling, rash, fever, numbness, weakness) ?    Lower back pain, L leg worse and numbness ? ?Protocols used: Leg Pain-A-AH ? ?

## 2021-06-26 NOTE — Telephone Encounter (Signed)
Gab at [facility] eye care center called in for pt, says has severe pain in joints and getting harder to wald and wants to know what med is safe to take with her heart condition. Please call back  ? ?Left message to call back about symptoms. ?

## 2021-06-26 NOTE — Telephone Encounter (Signed)
No answer, voicemail left. 

## 2021-06-30 ENCOUNTER — Ambulatory Visit (INDEPENDENT_AMBULATORY_CARE_PROVIDER_SITE_OTHER): Payer: 59 | Admitting: Physician Assistant

## 2021-06-30 ENCOUNTER — Ambulatory Visit: Payer: Medicaid Other | Admitting: Family Medicine

## 2021-06-30 ENCOUNTER — Encounter: Payer: Self-pay | Admitting: Physician Assistant

## 2021-06-30 VITALS — BP 93/70 | HR 88 | Ht 62.0 in | Wt 169.3 lb

## 2021-06-30 DIAGNOSIS — I5032 Chronic diastolic (congestive) heart failure: Secondary | ICD-10-CM | POA: Diagnosis not present

## 2021-06-30 DIAGNOSIS — M25552 Pain in left hip: Secondary | ICD-10-CM | POA: Insufficient documentation

## 2021-06-30 DIAGNOSIS — E1169 Type 2 diabetes mellitus with other specified complication: Secondary | ICD-10-CM | POA: Diagnosis not present

## 2021-06-30 DIAGNOSIS — Z794 Long term (current) use of insulin: Secondary | ICD-10-CM

## 2021-06-30 MED ORDER — TRULICITY 1.5 MG/0.5ML ~~LOC~~ SOAJ: 1.5000 mg | SUBCUTANEOUS | 2 refills | Status: DC

## 2021-06-30 MED ORDER — EMPAGLIFLOZIN 25 MG PO TABS: 25.0000 mg | ORAL_TABLET | Freq: Every day | ORAL | 1 refills | Status: DC

## 2021-06-30 MED ORDER — LANTUS SOLOSTAR 100 UNIT/ML ~~LOC~~ SOPN: 18.0000 [IU] | PEN_INJECTOR | Freq: Every day | SUBCUTANEOUS | 3 refills | Status: DC

## 2021-06-30 MED ORDER — ONDANSETRON 8 MG PO TBDP: 8.0000 mg | ORAL_TABLET | Freq: Three times a day (TID) | ORAL | 1 refills | Status: DC | PRN

## 2021-06-30 MED ORDER — TRAMADOL HCL 50 MG PO TABS: 50.0000 mg | ORAL_TABLET | Freq: Three times a day (TID) | ORAL | 0 refills | Status: AC | PRN

## 2021-06-30 NOTE — Assessment & Plan Note (Addendum)
Recently switched from losartan to entresto. BP now lower, entresto should improve SOB unsure etiology of increase, does not appear fluid overloaded today. ?Advised for now we monitor, to watch weight/fluid balance carefully, call if > 2 lbs. If SOB increases at all should call office or cardiologist.  ?

## 2021-06-30 NOTE — Assessment & Plan Note (Signed)
Pulled for refill 

## 2021-06-30 NOTE — Progress Notes (Signed)
? ?I,Candace [name],acting as a Education administrator for [employer], PA-C.,have documented all relevant documentation on the behalf of Mikey Kirschner, PA-C,as directed by  Mikey Kirschner, PA-C while in the presence of Mikey Kirschner, PA-C.  ?Established Patient Office Visit ? ?Subjective:  ?Patient ID: Candace [name], female    DOB: April 23, 1950  Age: 71 y.o. MRN: [id number] ? ?CC: cc. Left leg pain x 3-4 weeks ? ?Candace [name] reports having left sided hip pain that radiates down her left leg. Improves with walking, worse when sitting. Denies any injury, swelling, rash. Reports she has felt this pain before but not to this extent. She is taking 650 mg of tylenol, two pills at a time 4 times a day. This does not help the pain.  ? ?Reports recently switching her losartan to entresto, and feels increased chest tightness/sob since then. Denies increase in weight, denies lower extremity swelling. Denies increase in cough. ? ?Past Medical History:  ?Diagnosis Date  ? Allergy   ? Anemia   ? Anxiety   ? Arrhythmia   ? Arthritis   ? Atrial fibrillation ([place])   ? CHF (congestive heart failure) ([place])   ? COPD (chronic obstructive pulmonary disease) ([place])   ? Coronary artery disease   ? Depression   ? Diabetes mellitus without complication ([place])   ? Dyspnea   ? doe  ? Dysrhythmia   ? GERD (gastroesophageal reflux disease)   ? Headache   ? History of hiatal hernia   ? Hyperlipidemia   ? Hypertension   ? Myocardial infarction [facility])   ? 2016, 04/2017  ? Myocardial infarction with cardiac rehabilitation [facility])   ? MI 2016/ CABG 8/17    FINISHED CARDIAC REHAB 3 WEEKS AGO  ? Panic attack   ? Pneumonia   ? Reflux   ? Stroke [facility]) 2015  ? showed up on MRI; no weakness noted  ? TIA (transient ischemic attack)   ? Voice tremor   ? ? ?Past Surgical History:  ?Procedure Laterality Date  ? ABDOMINAL HYSTERECTOMY    ? APPENDECTOMY  1975  ? ARTERY BIOPSY Right 04/26/2016  ? Procedure: BIOPSY TEMPORAL ARTERY;  Surgeon: Margaretha Sheffield, MD;  Location: [facility] ORS;   Service: ENT;  Laterality: Right;  ? CARDIAC CATHETERIZATION N/A 11/06/2015  ? Procedure: Left Heart Cath and Coronary Angiography;  Surgeon: Corey Skains, MD;  Location: [place] CV LAB;  Service: Cardiovascular;  Laterality: N/A;  ? CESAREAN SECTION    ? COLONOSCOPY  2015  ? COLONOSCOPY WITH PROPOFOL N/A 11/03/2018  ? Procedure: COLONOSCOPY WITH PROPOFOL;  Surgeon: Virgel Manifold, MD;  Location: [facility] ENDOSCOPY;  Service: Endoscopy;  Laterality: N/A;  ? CORONARY ANGIOPLASTY  04/2017  ? [place]  ? CORONARY ARTERY BYPASS GRAFT N/A 11/10/2015  ? Procedure: CORONARY ARTERY BYPASS GRAFTING (CABG), ON PUMP, TIMES FOUR, USING LEFT INTERNAL MAMMARY ARTERY, BILATERAL GREATER SAPHENOUS VEINS HARVESTED ENDOSCOPICALLY;  Surgeon: [name], MD;  Location: [place];  Service: Open Heart Surgery;  Laterality: N/A;  LIMA-LAD; SEQ SVG-OM1-OM2; SVG-PL  ? CORONARY STENT INTERVENTION N/A 08/05/2016  ? Procedure: Coronary Stent Intervention;  Surgeon: Isaias Cowman, MD;  Location: [place] [facility];  Service: Cardiovascular;  Laterality: N/A;  ? ESOPHAGOGASTRODUODENOSCOPY (EGD) WITH PROPOFOL N/A 11/03/2018  ? Procedure: ESOPHAGOGASTRODUODENOSCOPY (EGD) WITH PROPOFOL;  Surgeon: Virgel Manifold, MD;  Location: [facility] ENDOSCOPY;  Service: Endoscopy;  Laterality: N/A;  ? HYSTERECTOMY ABDOMINAL WITH SALPINGO-OOPHORECTOMY Bilateral 08/15/2017  ? Procedure: HYSTERECTOMY ABDOMINAL WITH BILATERAL SALPINGO-OOPHORECTOMY;  Surgeon:  Rubie Maid, MD;  Location: [facility] ORS;  Service: Gynecology;  Laterality: Bilateral;  ? LEFT HEART CATH AND CORONARY ANGIOGRAPHY N/A 08/05/2016  ? Procedure: Left Heart Cath and Coronary Angiography;  Surgeon: Isaias Cowman, MD;  Location: [place] [facility];  Service: Cardiovascular;  Laterality: N/A;  ? LEFT HEART CATH AND CORS/GRAFTS ANGIOGRAPHY N/A 09/19/2018  ? Procedure: LEFT HEART CATH AND CORS/GRAFTS ANGIOGRAPHY;  Surgeon: Corey Skains, MD;  Location: [place] [facility];  Service: Cardiovascular;  Laterality: N/A;  ? LEFT HEART CATH AND CORS/GRAFTS ANGIOGRAPHY N/A 08/30/2019  ? Procedure: LEFT HEART CATH AND CORS/GRAFTS ANGIOGRAPHY;  Surgeon: Teodoro Spray, MD;  Location: [place] [facility];  Service: Cardiovascular;  Laterality: N/A;  ? OOPHORECTOMY    ? TEE WITHOUT CARDIOVERSION N/A 11/10/2015  ? Procedure: TRANSESOPHAGEAL ECHOCARDIOGRAM (TEE);  Surgeon: [name], MD;  Location: [place];  Service: Open Heart Surgery;  Laterality: N/A;  ? TUBAL LIGATION    ? ? ?Family History  ?Problem Relation Age of Onset  ? Cancer Father   ? Hypertension Father   ? Heart disease Father   ? Asthma Father   ? Cancer Mother   ? Hypertension Mother   ? Pancreatic cancer Mother 66  ? Cancer Sister   ? Breast cancer Sister 64  ? Breast cancer Sister 67  ? Lung cancer Brother   ? Pancreatic cancer Sister 49  ? Cancer Sister   ? ? ?Social History  ? ?Socioeconomic History  ? Marital status: Divorced  ?  Spouse name: Not on file  ? Number of children: 3  ? Years of education: Not on file  ? Highest education level: Some college, no degree  ?Occupational History  ? Occupation: retired  ?Tobacco Use  ? Smoking status: Former  ?  Packs/day: 0.50  ?  Types: Cigarettes  ?  Quit date: 10/07/2001  ?  Years since quitting: 19.7  ? Smokeless tobacco: Never  ?Vaping Use  ? Vaping Use: Never used  ?Substance and Sexual Activity  ? Alcohol use: No  ?  Alcohol/week: 0.0 standard drinks  ? Drug use: No  ? Sexual activity: Not Currently  ?Other Topics Concern  ? Not on file  ?Social History Narrative  ? Lives at home alone  ? ?Social Determinants of Health  ? ?Financial Resource Strain: Low Risk   ? Difficulty of Paying Living Expenses: Not hard at all  ?Food Insecurity: Not on file  ?Transportation Needs: Not on file  ?Physical Activity: Not on file  ?Stress: Not on file  ?Social Connections: Not on file  ?Intimate Partner Violence: Not on file  ? ? ?Outpatient Medications Prior to Visit   ?Medication Sig Dispense Refill  ? acetaminophen (TYLENOL) 325 MG tablet Take 650 mg by mouth every 6 (six) hours as needed for moderate pain or headache.     ? albuterol (VENTOLIN HFA) 108 (90 Base) MCG/ACT inhaler Inhale 2 puffs into the lungs every 6 (six) hours as needed for wheezing or shortness of breath. 8 g 2  ? bismuth subsalicylate (PEPTO BISMOL) 262 MG chewable tablet Chew 524 mg by mouth daily as needed for diarrhea or loose stools or indigestion.    ? Blood Glucose Monitoring Suppl (ONETOUCH VERIO) w/Device KIT Use daily to check blood sugar 1 kit 0  ? clopidogrel (PLAVIX) 75 MG tablet Take 1 tablet (75 mg total) by mouth daily. 90 tablet 0  ? ELIQUIS 5 MG TABS tablet Take 5 mg by  mouth 2 (two) times daily.    ? ezetimibe (ZETIA) 10 MG tablet Take 1 tablet (10 mg total) by mouth daily. 90 tablet 0  ? furosemide (LASIX) 20 MG tablet Take 20 mg by mouth daily.    ? glucose blood test strip Use to check blood sugars daily as instructed 100 each 12  ? Insulin Pen Needle 32G X 4 MM MISC Use to inject insulin daily 100 each 1  ? isosorbide mononitrate (IMDUR) 120 MG 24 hr tablet TAKE 1 TABLET BY MOUTH EVERY DAY 90 tablet 1  ? Lancets (ONETOUCH DELICA PLUS [id number]) MISC USE UP TO 4 TIMES DAILY AS DIRECTED 100 each 0  ? loperamide (IMODIUM) 2 MG capsule Take 2 mg by mouth daily as needed for diarrhea or loose stools.    ? metoprolol tartrate (LOPRESSOR) 100 MG tablet Take 100 mg by mouth 2 (two) times daily.    ? nitroGLYCERIN (NITROSTAT) 0.4 MG SL tablet Place 1 tablet (0.4 mg total) under the tongue every 5 (five) minutes as needed for chest pain. 30 tablet 0  ? nystatin (NYSTATIN) powder Apply 1 application topically daily. (Patient taking differently: Apply 1 application. topically daily as needed.) 60 g 5  ? pantoprazole (PROTONIX) 20 MG tablet Take 1 tablet (20 mg total) by mouth 2 (two) times daily. 60 tablet 11  ? rosuvastatin (CRESTOR) 40 MG tablet Take 1 tablet (40 mg total) by mouth daily. 90  tablet 1  ? sacubitril-valsartan (ENTRESTO) 24-26 MG Take 1 tablet by mouth 2 (two) times daily. 60 tablet 3  ? sertraline (ZOLOFT) 100 MG tablet Take 1 tablet (100 mg total) by mouth daily. 90 tablet 1  ? insulin glargi

## 2021-06-30 NOTE — Assessment & Plan Note (Signed)
Radiating pain to lower extremity. No edema, erythema, low suspicion for clot.  ?Advised not to exceed 4,000 mg of tylenol daily. ?Will add temporary tramadol 50 mg q 8 hrs for pain relief. Advised stretching, heat.  If pain does not improve, may need to explore doppler vs L hip images ?

## 2021-07-08 ENCOUNTER — Telehealth: Payer: Self-pay

## 2021-07-08 DIAGNOSIS — I152 Hypertension secondary to endocrine disorders: Secondary | ICD-10-CM

## 2021-07-08 MED ORDER — ISOSORBIDE MONONITRATE ER 120 MG PO TB24: 120.0000 mg | ORAL_TABLET | Freq: Every day | ORAL | 1 refills | Status: DC

## 2021-07-08 NOTE — Telephone Encounter (Signed)
Refill of Imdur sent into [facility] for Delivery.  ?

## 2021-07-13 NOTE — Chronic Care Management (AMB) (Signed)
Chronic Care Management   CCM RN Visit Note   Name: Candace [name] MRN: [id number] DOB: Jul 09, 1950  Subjective: Candace [name] is a 71 y.o. year old female who is a primary care patient of Maple Hudson., MD. The care management team was consulted for assistance with disease management and care coordination needs.    Engaged with patient by telephone for follow up visit in response to provider referral for case management and/or care coordination services.   Consent to Services:  The patient was given information about Chronic Care Management services, agreed to services, and gave verbal consent prior to initiation of services.  Please see initial visit note for detailed documentation.   Patient agreed to services and verbal consent obtained.   Assessment: Review of patient past medical history, allergies, medications, health status, including review of consultants reports, laboratory and other test data, was performed as part of comprehensive evaluation and provision of chronic care management services.   SDOH (Social Determinants of Health) assessments and interventions performed:    CCM Care Plan  Allergies  Allergen Reactions   Lisinopril Cough   Penicillins Swelling, Rash and Other (See Comments)    Did it involve swelling of the face/tongue/throat, SOB, or low BP? Yes Did it involve sudden or severe rash/hives, skin peeling, or any reaction on the inside of your mouth or nose? Yes Did you need to seek medical attention at a hospital or doctor's office? Yes When did it last happen?      15 years If all above answers are "NO", may proceed with cephalosporin use.     Outpatient Encounter Medications as of 06/26/2021  Medication Sig Note   acetaminophen (TYLENOL) 325 MG tablet Take 650 mg by mouth every 6 (six) hours as needed for moderate pain or headache.     albuterol (VENTOLIN HFA) 108 (90 Base) MCG/ACT inhaler Inhale 2 puffs into the lungs every 6 (six) hours as  needed for wheezing or shortness of breath.    bismuth subsalicylate (PEPTO BISMOL) 262 MG chewable tablet Chew 524 mg by mouth daily as needed for diarrhea or loose stools or indigestion.    Blood Glucose Monitoring Suppl (ONETOUCH VERIO) w/Device KIT Use daily to check blood sugar    clopidogrel (PLAVIX) 75 MG tablet Take 1 tablet (75 mg total) by mouth daily.    ELIQUIS 5 MG TABS tablet Take 5 mg by mouth 2 (two) times daily. 06/06/2020: Prescribed by Dr. Gwen Pounds   ezetimibe (ZETIA) 10 MG tablet Take 1 tablet (10 mg total) by mouth daily.    furosemide (LASIX) 20 MG tablet Take 20 mg by mouth daily.    glucose blood test strip Use to check blood sugars daily as instructed    Insulin Pen Needle 32G X 4 MM MISC Use to inject insulin daily    Lancets (ONETOUCH DELICA PLUS LANCET33G) MISC USE UP TO 4 TIMES DAILY AS DIRECTED    loperamide (IMODIUM) 2 MG capsule Take 2 mg by mouth daily as needed for diarrhea or loose stools.    metoprolol tartrate (LOPRESSOR) 100 MG tablet Take 100 mg by mouth 2 (two) times daily. 06/06/2020: Prescribed by Dr. Gwen Pounds    nitroGLYCERIN (NITROSTAT) 0.4 MG SL tablet Place 1 tablet (0.4 mg total) under the tongue every 5 (five) minutes as needed for chest pain.    nystatin (NYSTATIN) powder Apply 1 application topically daily. (Patient taking differently: Apply 1 application. topically daily as needed.)    pantoprazole (PROTONIX) 20  MG tablet Take 1 tablet (20 mg total) by mouth 2 (two) times daily.    rosuvastatin (CRESTOR) 40 MG tablet Take 1 tablet (40 mg total) by mouth daily.    sacubitril-valsartan (ENTRESTO) 24-26 MG Take 1 tablet by mouth 2 (two) times daily.    sertraline (ZOLOFT) 100 MG tablet Take 1 tablet (100 mg total) by mouth daily.    [DISCONTINUED] cetirizine (ZYRTEC) 5 MG tablet Take 2 tablets (10 mg total) by mouth daily.    [DISCONTINUED] insulin glargine (LANTUS SOLOSTAR) 100 UNIT/ML Solostar Pen Inject 18 Units into the skin at bedtime.     [DISCONTINUED] isosorbide mononitrate (IMDUR) 120 MG 24 hr tablet TAKE 1 TABLET BY MOUTH EVERY DAY    [DISCONTINUED] JARDIANCE 25 MG TABS tablet TAKE ONE TABLET o BEFORE BREAKFAST (Patient taking differently: Take 25 mg by mouth daily.)    [DISCONTINUED] ondansetron (ZOFRAN-ODT) 8 MG disintegrating tablet Take 1 tablet (8 mg total) by mouth every 8 (eight) hours as needed. (Patient taking differently: Take 8 mg by mouth every 8 (eight) hours as needed for refractory nausea / vomiting.)    [DISCONTINUED] TRULICITY 1.5 MG/0.5ML SOPN INJECT 1.5 MG into THE SKIN ONCE A WEEK 06/03/2021: Pending Refill   No facility-administered encounter medications on file as of 06/26/2021.    Patient Active Problem List   Diagnosis Date Noted   Left hip pain 06/30/2021   Vertigo    Unsteady gait    Acute bronchitis 03/07/2019   Esophageal dysphagia    Stomach irritation    Gastric polyp    Esophageal lump    Hx of colonic polyp    Polyp of colon    Diverticulosis of large intestine without diverticulitis    PSVT (paroxysmal supraventricular tachycardia) (HCC) 10/03/2018   Abnormal ECG 07/05/2018   Tachycardia 03/27/2018   Pain in limb 02/28/2018   Chronic diastolic CHF (congestive heart failure) (HCC) 11/03/2017   TIA (transient ischemic attack) 11/03/2017   COPD suggested by initial evaluation (HCC) 08/23/2017   Acute and chronic respiratory failure with hypoxia (HCC) 08/23/2017   Postoperative state 08/15/2017   Incidental pulmonary nodule 08/01/2017   Non-ST elevation myocardial infarction (NSTEMI), subendocardial infarction, subsequent episode of care (HCC) 06/01/2017   B12 deficiency 12/01/2016   Vitamin D deficiency 11/04/2016   Depression with anxiety 09/30/2016   AF (paroxysmal atrial fibrillation) (HCC) 09/30/2016   Resting tremor 09/30/2016   Mild obstructive sleep apnea 09/30/2016   Headache 08/18/2016   Unstable angina (HCC) 08/03/2016   Diabetes mellitus (HCC) 08/03/2016   Chronic  daily headache 05/05/2016   Asthma 11/11/2015   S/P CABG x 4 11/10/2015   Coronary artery disease 11/07/2015   Carotid artery narrowing 02/08/2014   Chest pain 02/08/2014   Mixed hyperlipidemia 02/08/2014   Bilateral carotid artery stenosis 02/08/2014   Atypical chest pain 02/07/2014   Essential hypertension 02/07/2014   Awareness of heartbeats 02/07/2014   D (diarrhea) 10/05/2013   Gastroesophageal reflux disease 10/05/2013   Care Plan : RN Care Management Plan of Care   Problem: DM, HF, HTN, A-Fib and DM      Long-Range Goal: Disease Progression Prevented or Minimized   Start Date: 04/29/2021  Expected End Date: 07/28/2021  Priority: High  Note:   Current Barriers:  Chronic Disease Management support and education needs related to Atrial Fibrillation, CHF, HTN, and DMII  RNCM Clinical Goal(s):  Patient will continue to work with RN Care Manager to address care management and care coordination needs related to  Atrial Fibrillation, CHF, HTN, and DMII through collaboration with RN Care manager, provider, and care team.   Interventions: 1:1 collaboration with primary care provider regarding development and update of comprehensive plan of care as evidenced by provider attestation and co-signature Inter-disciplinary care team collaboration (see longitudinal plan of care) Evaluation of current treatment plan related to  self management and patient's adherence to plan as established by provider   Hypertension Interventions: Reviewed plan for hypertension management. Reports taking medications as prescribed but not monitoring BP consistently. Advised to monitor BP readings and notify the clinic or Cardiology team for readings outside of established parameters. Reviewed complications of uncontrolled blood pressure.  Reviewed s/sx of heart attack, stroke and worsening symptoms that require immediate medical attention.   Diabetes Interventions: ( No interventions during this  outreach) Lab Results  Component Value Date   HGBA1C 8.5 (H) 03/26/2021  Reviewed plan for diabetes management. Reports taking medications as prescribed. Advised to update the CCM Pharmacy team with concerns regarding medication management or prescription cost. Reports monitoring blood glucose levels as advised. Fasting reading today was 133 mg/dl. Reports readings over the past few weeks have ranged from the 130's to 180's.  Reviewed nutritional intake and activity engagement. Advised to continue monitoring intake of carbohydrates and concentrated sugars. Advised to continue to engaging in low impact activity as tolerated. Reviewed pending appointments. Requested assistance with scheduling appointment with PCP. Appointment scheduled for 07/15/2021.    Medication Interventions: Patient called to review and confirm her medication plan. Reports missing a call from the [facility] technician earlier today.  Reviewed medications and chart. Patient informed of request submitted to Cardiology to switch Eliquis prescription to [facility]. Patient reports having needed prescriptions. Informed of pending medication delivery on 05/22/21. Agreed to contact the clinic within the next week if she requires additional assistance. Update on 05/25/21: Medications were due for delivery on 05/22/21. Patient reports all of the medications were not available as scheduled. The pharmacy will delivery all remaining medications later today. Patient will contact the team if additional assistance is required.  Chest Tightness Interventions: Patient reports episodes of chest tightness over the past week. Denied crushing or radiating chest pain. Denies shortness of breath. Denies changes in activity. Describes symptoms as "quick tightening" that resolves quickly. She has not taken nitroglycerin but agreed to administer if the symptoms return.  Thorough discussion regarding symptoms that require immediate medical  attention. Discussed high risk d/t significant cardiac history. Advised to call 911 or have a family member transport her to the nearest Emergency Room if the symptoms worsen. Update 06/11/2021: Reports completing evaluation for recurring chest discomfort. Reports taking nitroglycerin as advised and feeling much better today. Denies episodes of chest discomfort today. Denies palpitations or shortness of breath. Reports completing labs and chest x-ray as prescribed on 06/10/2021.  Discussed plan r/t completing tasks at home. Has very good family support.  Reports currently staying with her daughter until symptoms resolve.  Thorough discussion regarding worsening symptoms and indications for seeking immediate medical attention.    Patient Goals/Self-Care Activities: Patient will self administer medications as prescribed Patient will attend all scheduled provider appointments Patient will call pharmacy for medication refills Patient will continue to perform ADL's independently Patient will continue to perform IADL's independently Patient will call provider office for new concerns or questions   Follow Up Plan:   Will follow up within the next month.      PLAN A member of the care management team will follow up  next month.   [facility] Care Management [facility] [phone]

## 2021-07-14 ENCOUNTER — Ambulatory Visit: Payer: 59 | Admitting: Family

## 2021-07-14 NOTE — Progress Notes (Signed)
? Patient ID: Candace [name], female    DOB: 08/27/1950, 71 y.o.   MRN: [id number] ? ?HPI ? ?Candace [name] is a 71 y/o female with a history of CAD, DM, hyperlipidemia, HTN, stroke, anemia, anxiety, atrial fibrillation, COPD, depression, GERD, panic attacks, TIA and chronic heart failure.  ? ?Echo report from 06/18/20 reviewed and showed an EF of 60-65% along with moderate LVH and mild Candace.  ? ?LHC done 07/11/20 showed: ?1. Severe 3v CAD including 70-80% mid LAD, 60-70% diffuse LCx disease  ?(small vessel) and 90% distal RCA.    ?2. Patent SVG to RCA, Occluded SVG to OM (likely culprit), Atretic LIMA to  ?LAD  ?3. Normal left ventricular filling pressures (LVEDP = 15  mmHg)  ? ?LHC done 08/30/19 and showed: ?LM lesion is 30% stenosed. ?Ost LM to LM lesion is 30% stenosed. ?Ost LM lesion is 40% stenosed. ?Mid LAD-1 lesion is 40% stenosed. ?Mid LAD-2 lesion is 75% stenosed. ?Ost LAD to Prox LAD lesion is 55% stenosed. ?Ost Cx to Mid Cx lesion is 80% stenosed. ?Ost 1st Mrg to 1st Mrg lesion is 75% stenosed. ?Mid RCA lesion is 40% stenosed. ?SVG and is small. ?Non-stenotic Dist Graft to Insertion lesion was previously treated. ?SVG and is small. ?LIMA and is small.  ?Insignificant cad in native vessels with patent lima to lad, patent svg to pda and patent stent in svg to om.  ? ?Admitted 02/08/21 due to intermittent chest pain. Cardiology consult obtained. Discharged the next day.  ? ?She presents today for a follow-up appointment with a chief complaint of chest pain. She describes this as acute in nature having been present for the last week. She describes it as sharp, stabbing, burning in nature that is radiating down the left arm and now into her lower left back. She had episodes of nausea & sweating yesterday as well. She also has associated light-headedness, difficulty sleeping, palpitations, shortness of breath, chest tightness and fatigue along with this. She denies any cough, pedal edema, abdominal distention or  weight gain. In fact, she lost 5 pounds overnight after having numerous episodes of diarrhea.  ? ?Took 2 doses of NTG yesterday and 2 the day prior without any resolution or reduction in chest pain. Has a history of CABG in 2017.  ? ?Past Medical History:  ?Diagnosis Date  ? Allergy   ? Anemia   ? Anxiety   ? Arrhythmia   ? Arthritis   ? Atrial fibrillation ([place])   ? CHF (congestive heart failure) ([place])   ? COPD (chronic obstructive pulmonary disease) ([place])   ? Coronary artery disease   ? Depression   ? Diabetes mellitus without complication ([place])   ? Dyspnea   ? doe  ? Dysrhythmia   ? GERD (gastroesophageal reflux disease)   ? Headache   ? History of hiatal hernia   ? Hyperlipidemia   ? Hypertension   ? Myocardial infarction [facility])   ? 2016, 04/2017  ? Myocardial infarction with cardiac rehabilitation [facility])   ? MI 2016/ CABG 8/17    FINISHED CARDIAC REHAB 3 WEEKS AGO  ? Panic attack   ? Pneumonia   ? Reflux   ? Stroke [facility]) 2015  ? showed up on MRI; no weakness noted  ? TIA (transient ischemic attack)   ? Voice tremor   ? ?Past Surgical History:  ?Procedure Laterality Date  ? ABDOMINAL HYSTERECTOMY    ? APPENDECTOMY  1975  ? ARTERY BIOPSY Right 04/26/2016  ? Procedure:  BIOPSY TEMPORAL ARTERY;  Surgeon: Margaretha Sheffield, MD;  Location: [facility] ORS;  Service: ENT;  Laterality: Right;  ? CARDIAC CATHETERIZATION N/A 11/06/2015  ? Procedure: Left Heart Cath and Coronary Angiography;  Surgeon: Corey Skains, MD;  Location: [place] CV LAB;  Service: Cardiovascular;  Laterality: N/A;  ? CESAREAN SECTION    ? COLONOSCOPY  2015  ? COLONOSCOPY WITH PROPOFOL N/A 11/03/2018  ? Procedure: COLONOSCOPY WITH PROPOFOL;  Surgeon: Virgel Manifold, MD;  Location: [facility] ENDOSCOPY;  Service: Endoscopy;  Laterality: N/A;  ? CORONARY ANGIOPLASTY  04/2017  ? [place]  ? CORONARY ARTERY BYPASS GRAFT N/A 11/10/2015  ? Procedure: CORONARY ARTERY BYPASS GRAFTING (CABG), ON PUMP, TIMES FOUR, USING LEFT INTERNAL MAMMARY ARTERY,  BILATERAL GREATER SAPHENOUS VEINS HARVESTED ENDOSCOPICALLY;  Surgeon: [name], MD;  Location: [place];  Service: Open Heart Surgery;  Laterality: N/A;  LIMA-LAD; SEQ SVG-OM1-OM2; SVG-PL  ? CORONARY STENT INTERVENTION N/A 08/05/2016  ? Procedure: Coronary Stent Intervention;  Surgeon: Isaias Cowman, MD;  Location: [place] CV LAB;  Service: Cardiovascular;  Laterality: N/A;  ? ESOPHAGOGASTRODUODENOSCOPY (EGD) WITH PROPOFOL N/A 11/03/2018  ? Procedure: ESOPHAGOGASTRODUODENOSCOPY (EGD) WITH PROPOFOL;  Surgeon: Virgel Manifold, MD;  Location: [facility] ENDOSCOPY;  Service: Endoscopy;  Laterality: N/A;  ? HYSTERECTOMY ABDOMINAL WITH SALPINGO-OOPHORECTOMY Bilateral 08/15/2017  ? Procedure: HYSTERECTOMY ABDOMINAL WITH BILATERAL SALPINGO-OOPHORECTOMY;  Surgeon: Rubie Maid, MD;  Location: [facility] ORS;  Service: Gynecology;  Laterality: Bilateral;  ? LEFT HEART CATH AND CORONARY ANGIOGRAPHY N/A 08/05/2016  ? Procedure: Left Heart Cath and Coronary Angiography;  Surgeon: Isaias Cowman, MD;  Location: [place] CV LAB;  Service: Cardiovascular;  Laterality: N/A;  ? LEFT HEART CATH AND CORS/GRAFTS ANGIOGRAPHY N/A 09/19/2018  ? Procedure: LEFT HEART CATH AND CORS/GRAFTS ANGIOGRAPHY;  Surgeon: Corey Skains, MD;  Location: [place] CV LAB;  Service: Cardiovascular;  Laterality: N/A;  ? LEFT HEART CATH AND CORS/GRAFTS ANGIOGRAPHY N/A 08/30/2019  ? Procedure: LEFT HEART CATH AND CORS/GRAFTS ANGIOGRAPHY;  Surgeon: Teodoro Spray, MD;  Location: [place] CV LAB;  Service: Cardiovascular;  Laterality: N/A;  ? OOPHORECTOMY    ? TEE WITHOUT CARDIOVERSION N/A 11/10/2015  ? Procedure: TRANSESOPHAGEAL ECHOCARDIOGRAM (TEE);  Surgeon: [name], MD;  Location: [place];  Service: Open Heart Surgery;  Laterality: N/A;  ? TUBAL LIGATION    ? ?Family History  ?Problem Relation Age of Onset  ? Cancer Father   ? Hypertension Father   ? Heart disease Father   ? Asthma Father   ? Cancer Mother   ?  Hypertension Mother   ? Pancreatic cancer Mother 61  ? Cancer Sister   ? Breast cancer Sister 3  ? Breast cancer Sister 46  ? Lung cancer Brother   ? Pancreatic cancer Sister 70  ? Cancer Sister   ? ?Social History  ? ?Tobacco Use  ? Smoking status: Former  ?  Packs/day: 0.50  ?  Types: Cigarettes  ?  Quit date: 10/07/2001  ?  Years since quitting: 19.7  ? Smokeless tobacco: Never  ?Substance Use Topics  ? Alcohol use: No  ?  Alcohol/week: 0.0 standard drinks  ? ?Allergies  ?Allergen Reactions  ? Lisinopril Cough  ? Penicillins Swelling, Rash and Other (See Comments)  ?  Did it involve swelling of the face/tongue/throat, SOB, or low BP? Yes ?Did it involve sudden or severe rash/hives, skin peeling, or any reaction on the inside of your mouth or nose? Yes ?Did you need to seek  medical attention at a hospital or doctor's office? Yes ?When did it last happen?      15 years ?If all above answers are ?NO?, may proceed with cephalosporin use. ?  ? ?Prior to Admission medications   ?Medication Sig Start Date End Date Taking? Authorizing Provider  ?acetaminophen (TYLENOL) 325 MG tablet Take 650 mg by mouth every 6 (six) hours as needed for moderate pain or headache.    Yes [provider]  ?clopidogrel (PLAVIX) 75 MG tablet Take 1 tablet (75 mg total) by mouth daily. 09/05/20  Yes [name]., MD  ?Dulaglutide (TRULICITY) 1.5 JQ/7.3AL SOPN Inject 1.5 mg into the skin once a week. 06/30/21  Yes Drubel, Ria Comment, PA-C  ?ELIQUIS 5 MG TABS tablet Take 5 mg by mouth 2 (two) times daily. 03/26/20  Yes [provider]  ?empagliflozin (JARDIANCE) 25 MG TABS tablet Take 1 tablet (25 mg total) by mouth daily. 06/30/21  Yes Mikey Kirschner, PA-C  ?ezetimibe (ZETIA) 10 MG tablet Take 1 tablet (10 mg total) by mouth daily. 09/05/20  Yes [name]., MD  ?furosemide (LASIX) 20 MG tablet Take 20 mg by mouth daily.   Yes [provider]  ?glucose blood test strip Use to check blood sugars daily as  instructed 12/23/20  Yes [name]., MD  ?insulin glargine (LANTUS SOLOSTAR) 100 UNIT/ML Solostar Pen Inject 18 Units into the skin at bedtime. 06/30/21  Yes Mikey Kirschner, PA-C  ?Insulin Pen Needle 32G X 4 MM

## 2021-07-15 ENCOUNTER — Other Ambulatory Visit: Payer: Self-pay

## 2021-07-15 ENCOUNTER — Encounter: Payer: Self-pay | Admitting: Emergency Medicine

## 2021-07-15 ENCOUNTER — Encounter: Payer: Self-pay | Admitting: Family

## 2021-07-15 ENCOUNTER — Emergency Department: Payer: 59

## 2021-07-15 ENCOUNTER — Encounter: Payer: Self-pay | Admitting: Pharmacist

## 2021-07-15 ENCOUNTER — Observation Stay
Admission: EM | Admit: 2021-07-15 | Discharge: 2021-07-17 | Disposition: A | Discharge status: Home / Self Care | Payer: 59 | Attending: Internal Medicine | Admitting: Internal Medicine

## 2021-07-15 ENCOUNTER — Ambulatory Visit (HOSPITAL_BASED_OUTPATIENT_CLINIC_OR_DEPARTMENT_OTHER): Payer: 59 | Admitting: Family

## 2021-07-15 ENCOUNTER — Ambulatory Visit: Payer: Medicaid Other | Admitting: Family Medicine

## 2021-07-15 VITALS — BP 112/68 | HR 82 | Resp 16 | Ht 62.0 in | Wt 173.2 lb

## 2021-07-15 DIAGNOSIS — J449 Chronic obstructive pulmonary disease, unspecified: Secondary | ICD-10-CM | POA: Insufficient documentation

## 2021-07-15 DIAGNOSIS — R079 Chest pain, unspecified: Secondary | ICD-10-CM | POA: Diagnosis present

## 2021-07-15 DIAGNOSIS — I10 Essential (primary) hypertension: Secondary | ICD-10-CM

## 2021-07-15 DIAGNOSIS — Z794 Long term (current) use of insulin: Secondary | ICD-10-CM

## 2021-07-15 DIAGNOSIS — I252 Old myocardial infarction: Secondary | ICD-10-CM | POA: Insufficient documentation

## 2021-07-15 DIAGNOSIS — Z951 Presence of aortocoronary bypass graft: Secondary | ICD-10-CM | POA: Insufficient documentation

## 2021-07-15 DIAGNOSIS — Z79899 Other long term (current) drug therapy: Secondary | ICD-10-CM | POA: Insufficient documentation

## 2021-07-15 DIAGNOSIS — E1122 Type 2 diabetes mellitus with diabetic chronic kidney disease: Secondary | ICD-10-CM | POA: Diagnosis not present

## 2021-07-15 DIAGNOSIS — I4430 Unspecified atrioventricular block: Secondary | ICD-10-CM | POA: Insufficient documentation

## 2021-07-15 DIAGNOSIS — E119 Type 2 diabetes mellitus without complications: Secondary | ICD-10-CM | POA: Insufficient documentation

## 2021-07-15 DIAGNOSIS — I110 Hypertensive heart disease with heart failure: Secondary | ICD-10-CM | POA: Diagnosis not present

## 2021-07-15 DIAGNOSIS — Z7901 Long term (current) use of anticoagulants: Secondary | ICD-10-CM | POA: Insufficient documentation

## 2021-07-15 DIAGNOSIS — Z87891 Personal history of nicotine dependence: Secondary | ICD-10-CM | POA: Diagnosis not present

## 2021-07-15 DIAGNOSIS — K219 Gastro-esophageal reflux disease without esophagitis: Secondary | ICD-10-CM | POA: Insufficient documentation

## 2021-07-15 DIAGNOSIS — Z8673 Personal history of transient ischemic attack (TIA), and cerebral infarction without residual deficits: Secondary | ICD-10-CM | POA: Insufficient documentation

## 2021-07-15 DIAGNOSIS — I447 Left bundle-branch block, unspecified: Secondary | ICD-10-CM | POA: Insufficient documentation

## 2021-07-15 DIAGNOSIS — I2510 Atherosclerotic heart disease of native coronary artery without angina pectoris: Secondary | ICD-10-CM | POA: Insufficient documentation

## 2021-07-15 DIAGNOSIS — I5032 Chronic diastolic (congestive) heart failure: Secondary | ICD-10-CM

## 2021-07-15 DIAGNOSIS — N182 Chronic kidney disease, stage 2 (mild): Secondary | ICD-10-CM

## 2021-07-15 DIAGNOSIS — Z7984 Long term (current) use of oral hypoglycemic drugs: Secondary | ICD-10-CM | POA: Insufficient documentation

## 2021-07-15 DIAGNOSIS — I480 Paroxysmal atrial fibrillation: Secondary | ICD-10-CM | POA: Diagnosis not present

## 2021-07-15 DIAGNOSIS — I509 Heart failure, unspecified: Secondary | ICD-10-CM | POA: Insufficient documentation

## 2021-07-15 DIAGNOSIS — F32A Depression, unspecified: Secondary | ICD-10-CM | POA: Insufficient documentation

## 2021-07-15 DIAGNOSIS — E785 Hyperlipidemia, unspecified: Secondary | ICD-10-CM | POA: Insufficient documentation

## 2021-07-15 DIAGNOSIS — F419 Anxiety disorder, unspecified: Secondary | ICD-10-CM | POA: Insufficient documentation

## 2021-07-15 LAB — LIPASE, BLOOD: Lipase: 35 U/L (ref 11–51)

## 2021-07-15 LAB — D-DIMER, QUANTITATIVE: D-Dimer, Quant: 0.29 ug/mL-FEU (ref 0.00–0.50)

## 2021-07-15 LAB — HEPATIC FUNCTION PANEL
ALT: 38 U/L (ref 0–44)
AST: 49 U/L — ABNORMAL HIGH (ref 15–41)
Albumin: 3.5 g/dL (ref 3.5–5.0)
Alkaline Phosphatase: 88 U/L (ref 38–126)
Bilirubin, Direct: <0.1 mg/dL (ref 0.0–0.2)
Total Bilirubin: 0.5 mg/dL (ref 0.3–1.2)
Total Protein: 6.5 g/dL (ref 6.5–8.1)

## 2021-07-15 LAB — GLUCOSE, CAPILLARY
Glucose-Capillary: 81 mg/dL (ref 70–99)
Glucose-Capillary: 162 mg/dL — ABNORMAL HIGH (ref 70–99)

## 2021-07-15 LAB — BASIC METABOLIC PANEL
Anion gap: 6 (ref 5–15)
BUN: 10 mg/dL (ref 8–23)
CO2: 28 mmol/L (ref 22–32)
Calcium: 8.9 mg/dL (ref 8.9–10.3)
Chloride: 111 mmol/L (ref 98–111)
Creatinine, Ser: 0.8 mg/dL (ref 0.44–1.00)
GFR, Estimated: >60 mL/min (ref >60)
Glucose, Bld: 126 mg/dL — ABNORMAL HIGH (ref 70–99)
Potassium: 4.3 mmol/L (ref 3.5–5.1)
Sodium: 145 mmol/L (ref 135–145)

## 2021-07-15 LAB — CBC
HCT: 37.5 % (ref 36.0–46.0)
Hemoglobin: 12.2 g/dL (ref 12.0–15.0)
MCH: 31.3 pg (ref 26.0–34.0)
MCHC: 32.5 g/dL (ref 30.0–36.0)
MCV: 96.2 fL (ref 80.0–100.0)
Platelets: 179 10*3/uL (ref 150–400)
RBC: 3.9 MIL/uL (ref 3.87–5.11)
RDW: 12.5 % (ref 11.5–15.5)
WBC: 6.8 10*3/uL (ref 4.0–10.5)
nRBC: 0 % (ref 0.0–0.2)

## 2021-07-15 LAB — TROPONIN I (HIGH SENSITIVITY)
Troponin I (High Sensitivity): 8 ng/L (ref <18)
Troponin I (High Sensitivity): 8 ng/L (ref <18)
Troponin I (High Sensitivity): 7 ng/L (ref <18)

## 2021-07-15 LAB — CBG MONITORING, ED: Glucose-Capillary: 75 mg/dL (ref 70–99)

## 2021-07-15 MED ORDER — LOPERAMIDE HCL 2 MG PO CAPS: 2.0000 mg | ORAL_CAPSULE | Freq: Every day | ORAL | Status: DC | PRN

## 2021-07-15 MED ORDER — CLOPIDOGREL BISULFATE 75 MG PO TABS
75.0000 mg | ORAL_TABLET | Freq: Every day | ORAL | Status: DC
  Administered 2021-07-16 – 2021-07-17 (×2): 75 mg via ORAL
  Filled 2021-07-15 (×2): qty 1

## 2021-07-15 MED ORDER — APIXABAN 5 MG PO TABS
5.0000 mg | ORAL_TABLET | Freq: Two times a day (BID) | ORAL | Status: DC
  Administered 2021-07-15 – 2021-07-17 (×4): 5 mg via ORAL
  Filled 2021-07-15 (×4): qty 1

## 2021-07-15 MED ORDER — ONDANSETRON HCL 4 MG/2ML IJ SOLN
4.0000 mg | Freq: Once | INTRAMUSCULAR | Status: AC
  Administered 2021-07-15: 4 mg via INTRAVENOUS
  Filled 2021-07-15: qty 2

## 2021-07-15 MED ORDER — ONDANSETRON 8 MG PO TBDP
8.0000 mg | ORAL_TABLET | Freq: Three times a day (TID) | ORAL | Status: DC | PRN
  Filled 2021-07-15: qty 1

## 2021-07-15 MED ORDER — ISOSORBIDE MONONITRATE ER 60 MG PO TB24
120.0000 mg | ORAL_TABLET | Freq: Every day | ORAL | Status: DC
  Administered 2021-07-16 – 2021-07-17 (×2): 120 mg via ORAL
  Filled 2021-07-15 (×2): qty 2

## 2021-07-15 MED ORDER — ACETAMINOPHEN 325 MG PO TABS
650.0000 mg | ORAL_TABLET | ORAL | Status: DC | PRN
  Administered 2021-07-15 – 2021-07-16 (×3): 650 mg via ORAL
  Filled 2021-07-15 (×3): qty 2

## 2021-07-15 MED ORDER — ROSUVASTATIN CALCIUM 10 MG PO TABS
40.0000 mg | ORAL_TABLET | Freq: Every day | ORAL | Status: DC
  Administered 2021-07-16 – 2021-07-17 (×2): 40 mg via ORAL
  Filled 2021-07-15 (×2): qty 4

## 2021-07-15 MED ORDER — ENOXAPARIN SODIUM 40 MG/0.4ML IJ SOSY: 40.0000 mg | PREFILLED_SYRINGE | INTRAMUSCULAR | Status: DC

## 2021-07-15 MED ORDER — MORPHINE SULFATE (PF) 4 MG/ML IV SOLN
4.0000 mg | Freq: Once | INTRAVENOUS | Status: AC
  Administered 2021-07-15: 4 mg via INTRAVENOUS
  Filled 2021-07-15: qty 1

## 2021-07-15 MED ORDER — INSULIN GLARGINE-YFGN 100 UNIT/ML ~~LOC~~ SOLN
10.0000 [IU] | Freq: Every day | SUBCUTANEOUS | Status: DC
  Administered 2021-07-15 – 2021-07-16 (×2): 10 [IU] via SUBCUTANEOUS
  Filled 2021-07-15 (×3): qty 0.1

## 2021-07-15 MED ORDER — ASPIRIN 325 MG PO TABS
325.0000 mg | ORAL_TABLET | Freq: Every day | ORAL | Status: DC
  Administered 2021-07-15 – 2021-07-17 (×3): 325 mg via ORAL
  Filled 2021-07-15 (×3): qty 1

## 2021-07-15 MED ORDER — SACUBITRIL-VALSARTAN 24-26 MG PO TABS
1.0000 | ORAL_TABLET | Freq: Two times a day (BID) | ORAL | Status: DC
  Administered 2021-07-15 – 2021-07-17 (×4): 1 via ORAL
  Filled 2021-07-15 (×5): qty 1

## 2021-07-15 MED ORDER — NITROGLYCERIN 0.4 MG SL SUBL
0.4000 mg | SUBLINGUAL_TABLET | SUBLINGUAL | Status: DC | PRN
  Administered 2021-07-15 – 2021-07-17 (×3): 0.4 mg via SUBLINGUAL
  Filled 2021-07-15 (×3): qty 1

## 2021-07-15 MED ORDER — EZETIMIBE 10 MG PO TABS
10.0000 mg | ORAL_TABLET | Freq: Every day | ORAL | Status: DC
  Administered 2021-07-16 – 2021-07-17 (×2): 10 mg via ORAL
  Filled 2021-07-15 (×2): qty 1

## 2021-07-15 MED ORDER — SERTRALINE HCL 50 MG PO TABS
100.0000 mg | ORAL_TABLET | Freq: Every day | ORAL | Status: DC
  Administered 2021-07-16 – 2021-07-17 (×2): 100 mg via ORAL
  Filled 2021-07-15 (×2): qty 2

## 2021-07-15 MED ORDER — EMPAGLIFLOZIN 25 MG PO TABS
25.0000 mg | ORAL_TABLET | Freq: Every day | ORAL | Status: DC
  Filled 2021-07-15: qty 1

## 2021-07-15 MED ORDER — ONDANSETRON HCL 4 MG/2ML IJ SOLN
4.0000 mg | Freq: Four times a day (QID) | INTRAMUSCULAR | Status: DC | PRN
  Administered 2021-07-15 – 2021-07-16 (×2): 4 mg via INTRAVENOUS
  Filled 2021-07-15 (×2): qty 2

## 2021-07-15 MED ORDER — METOPROLOL TARTRATE 50 MG PO TABS
100.0000 mg | ORAL_TABLET | Freq: Two times a day (BID) | ORAL | Status: DC
  Administered 2021-07-15 – 2021-07-17 (×4): 100 mg via ORAL
  Filled 2021-07-15 (×4): qty 2

## 2021-07-15 MED ORDER — INSULIN ASPART 100 UNIT/ML IJ SOLN
0.0000 [IU] | Freq: Three times a day (TID) | INTRAMUSCULAR | Status: DC
  Administered 2021-07-16 – 2021-07-17 (×3): 2 [IU] via SUBCUTANEOUS
  Administered 2021-07-17: 5 [IU] via SUBCUTANEOUS
  Filled 2021-07-15 (×4): qty 1

## 2021-07-15 MED ORDER — PANTOPRAZOLE SODIUM 20 MG PO TBEC
20.0000 mg | DELAYED_RELEASE_TABLET | Freq: Two times a day (BID) | ORAL | Status: DC
  Administered 2021-07-15 – 2021-07-16 (×3): 20 mg via ORAL
  Filled 2021-07-15 (×6): qty 1

## 2021-07-15 NOTE — ED Provider Notes (Signed)
? ?[facility] ?Provider Note ? ? ? Event Date/Time  ? First MD Initiated Contact with Patient 07/15/21 1201   ?  (approximate) ? ? ?History  ? ?Chest Pain ? ? ?HPI ? ?Candace [name] is a 71 y.o. female with history of CAD, hypertension, diabetes, CHF, CVA, and COPD presents emergency department with chest pain for 2 weeks.  Patient states that today she began to feel dizzy, had chest pain, and some shortness of breath.  States she might have got a little sweaty but unsure.  Patient states she took nitroglycerin without any relief.  Denies any swelling in her feet or legs. ? ?  ? ? ?Physical Exam  ? ?Triage Vital Signs: ?ED Triage Vitals  ?Enc Vitals Group  ?   BP 07/15/21 1042 117/71  ?   Pulse Rate 07/15/21 1042 81  ?   Resp 07/15/21 1042 17  ?   Temp 07/15/21 1042 98.6 ?F (37 ?C)  ?   Temp Source 07/15/21 1042 Oral  ?   SpO2 07/15/21 1042 98 %  ?   Weight 07/15/21 1040 173 lb 1 oz (78.5 kg)  ?   Height 07/15/21 1040 '5\' 2"'$  (1.575 m)  ?   Head Circumference --   ?   Peak Flow --   ?   Pain Score 07/15/21 1040 8  ?   Pain Loc --   ?   Pain Edu? --   ?   Excl. in [place]? --   ? ? ?Most recent vital signs: ?Vitals:  ? 07/15/21 1500 07/15/21 1600  ?BP:    ?Pulse: 70 67  ?Resp: 10 15  ?Temp:    ?SpO2: 100% 100%  ? ? ? ?General: Awake, no distress.   ?CV:  Good peripheral perfusion. regular rate and  rhythm ?Resp:  Normal effort. Lungs CTA ?Abd:  No distention.   ?Other:  No edema noted lower extremities ? ? ?ED Results / Procedures / Treatments  ? ?Labs ?(all labs ordered are listed, but only abnormal results are displayed) ?Labs Reviewed  ?BASIC METABOLIC PANEL - Abnormal; Notable for the following components:  ?    Result Value  ? Glucose, Bld 126 (*)   ? All other components within normal limits  ?HEPATIC FUNCTION PANEL - Abnormal; Notable for the following components:  ? AST 49 (*)   ? All other components within normal limits  ?CBC  ?LIPASE, BLOOD  ?D-DIMER, QUANTITATIVE  ?TROPONIN I (HIGH  SENSITIVITY)  ?TROPONIN I (HIGH SENSITIVITY)  ? ? ? ?EKG ? ?EKG ? ? ?RADIOLOGY ?Chest x-ray ? ? ? ?PROCEDURES: ? ? ?Procedures ? ? ?MEDICATIONS ORDERED IN ED: ?Medications  ?aspirin tablet 325 mg (325 mg Oral Given 07/15/21 1332)  ?nitroGLYCERIN (NITROSTAT) SL tablet 0.4 mg (0.4 mg Sublingual Given 07/15/21 1333)  ?morphine (PF) 4 MG/ML injection 4 mg (4 mg Intravenous Given 07/15/21 1509)  ?ondansetron [facility]) injection 4 mg (4 mg Intravenous Given 07/15/21 1508)  ? ? ? ?IMPRESSION / MDM / ASSESSMENT AND PLAN / ED COURSE  ?I reviewed the triage vital signs and the nursing notes. ?             ?               ? ?Differential diagnosis includes, but is not limited to, MI, nonspecific chest pain, NSTEMI, CHF, PE, esophagitis, pancreatitis ? ?Labs are reassuring, her CBC, basic metabolic panel and troponin are all normal. ? ?EKG is similar to the  previous EKGs. ? ?Chest x-ray was independently reviewed by me, no acute abnormality seen.  Confirmed by radiology ? ?We will give the patient aspirin 325 mg, nitro sublingual to see if we can have her be pain-free.  We will consult to cardiology. ? ?Patient did not have any relief with ASA 325 and nitroglycerin.  We will give her morphine 4 mg IV and Zofran 4 mg IV. ? ?Consult to cardiology, spoke with Dr. Clayborn Bigness.  He states not convinced it is cardiac but would advise admission. ? ?Consult hospitalist for admission ?Hospitalist admitting patient.  Dr. Tamala Julian and see the patient also. ? ? ? ?  ? ? ?FINAL CLINICAL IMPRESSION(S) / ED DIAGNOSES  ? ?Final diagnoses:  ?Nonspecific chest pain  ? ? ? ?Rx / DC Orders  ? ?ED Discharge Orders   ? ? None  ? ?  ? ? ? ?Note:  This document was prepared using Dragon voice recognition software and may include unintentional dictation errors. ? ?  ?Versie Starks, PA-C ?07/15/21 1630 ? ?  ?Lucrezia Starch, MD ?07/15/21 1800 ? ?

## 2021-07-15 NOTE — Patient Instructions (Signed)
Continue weighing daily and call for an overnight weight gain of 3 pounds or more or a weekly weight gain of more than 5 pounds.   If you have voicemail, please make sure your mailbox is cleaned out so that we may leave a message and please make sure to listen to any voicemails.     

## 2021-07-15 NOTE — Progress Notes (Deleted)
Established patient visit   Patient: Candace [name]   DOB: 1951-02-07   71 y.o. Female  MRN: [id number] Visit Date: 07/15/2021  Today's healthcare provider: [name], MD   No chief complaint on file.  Subjective    HPI  Diabetes Mellitus Type II, follow-up  Lab Results  Component Value Date   HGBA1C 8.5 (H) 03/26/2021   HGBA1C 10.7 (H) 12/02/2020   HGBA1C 9.1 (H) 08/11/2020   Last seen for diabetes 4 months ago.  Management since then includes continuing the same treatment. She reports {excellent/good/fair/poor:19665} compliance with treatment. She {is/is not:21021397} having side effects. {document side effects if present:1}  Home blood sugar records: {diabetes glucometry results:16657}  Episodes of hypoglycemia? {Yes/No:20286} {enter details if yes:1}   Current insulin regiment: {***Type 'None' if not taking insulin                                                otherwise enter complete                                                 details of insulin regiment:1} Most Recent Eye Exam: ***  --------------------------------------------------------------------------------------------------- Hypertension, follow-up  BP Readings from Last 3 Encounters:  06/30/21 93/70  06/23/21 102/68  06/10/21 105/66   Wt Readings from Last 3 Encounters:  06/30/21 169 lb 4.8 oz (76.8 kg)  06/23/21 171 lb 8 oz (77.8 kg)  02/09/21 164 lb 8 oz (74.6 kg)     She was last seen for hypertension 2 weeks ago.  BP at that visit was 93/70. Management since that visit includes; taking Imdur 120 mg and metoprolol 100 mg. She reports {excellent/good/fair/poor:19665} compliance with treatment. She {is/is not:9024} having side effects. {document side effects if present:1} She {is/is not:9024} exercising. She {is/is not:9024} adherent to low salt diet.   Outside blood pressures are {enter patient reported home BP, or 'not being checked':1}.  She {does/does not:200015}  smoke.  Use of agents associated with hypertension: {bp agents assoc with hypertension:511::"none"}.   --------------------------------------------------------------------------------------------------- Lipid/Cholesterol, follow-up  Last Lipid Panel: Lab Results  Component Value Date   CHOL 164 08/11/2020   LDLCALC 90 08/11/2020   HDL 57 08/11/2020   TRIG 94 08/11/2020    She was last seen for this 11 months ago.  Management since that visit includes; taking rosuvastatin 40 mg.  She reports {excellent/good/fair/poor:19665} compliance with treatment. She {is/is not:9024} having side effects. {document side effects if present:1}  She is following a {diet:21022986} diet. Current exercise: {exercise DGLOV:56433}  Last metabolic panel Lab Results  Component Value Date   GLUCOSE 212 (H) 06/10/2021   NA 143 06/10/2021   K 4.3 06/10/2021   BUN 17 06/10/2021   CREATININE 0.95 06/10/2021   EGFR 64 06/10/2021   GFRNONAA >60 02/09/2021   CALCIUM 9.0 06/10/2021   AST 23 06/10/2021   ALT 19 06/10/2021   The ASCVD Risk score (Arnett DK, et al., 2019) failed to calculate for the following reasons:   The patient has a prior MI or stroke diagnosis  --------------------------------------------------------------------------------------------------   Medications: Outpatient Medications Prior to Visit  Medication Sig   acetaminophen (TYLENOL) 325 MG tablet Take 650 mg by mouth every 6 (  six) hours as needed for moderate pain or headache.    albuterol (VENTOLIN HFA) 108 (90 Base) MCG/ACT inhaler Inhale 2 puffs into the lungs every 6 (six) hours as needed for wheezing or shortness of breath.   bismuth subsalicylate (PEPTO BISMOL) 262 MG chewable tablet Chew 524 mg by mouth daily as needed for diarrhea or loose stools or indigestion.   Blood Glucose Monitoring Suppl (ONETOUCH VERIO) w/Device KIT Use daily to check blood sugar   clopidogrel (PLAVIX) 75 MG tablet Take 1 tablet (75 mg total)  by mouth daily.   Dulaglutide (TRULICITY) 1.5 OM/7.6HM SOPN Inject 1.5 mg into the skin once a week.   ELIQUIS 5 MG TABS tablet Take 5 mg by mouth 2 (two) times daily.   empagliflozin (JARDIANCE) 25 MG TABS tablet Take 1 tablet (25 mg total) by mouth daily.   ezetimibe (ZETIA) 10 MG tablet Take 1 tablet (10 mg total) by mouth daily.   furosemide (LASIX) 20 MG tablet Take 20 mg by mouth daily.   glucose blood test strip Use to check blood sugars daily as instructed   insulin glargine (LANTUS SOLOSTAR) 100 UNIT/ML Solostar Pen Inject 18 Units into the skin at bedtime.   Insulin Pen Needle 32G X 4 MM MISC Use to inject insulin daily   isosorbide mononitrate (IMDUR) 120 MG 24 hr tablet Take 1 tablet (120 mg total) by mouth daily.   Lancets (ONETOUCH DELICA PLUS [id number]) MISC USE UP TO 4 TIMES DAILY AS DIRECTED   loperamide (IMODIUM) 2 MG capsule Take 2 mg by mouth daily as needed for diarrhea or loose stools.   metoprolol tartrate (LOPRESSOR) 100 MG tablet Take 100 mg by mouth 2 (two) times daily.   nitroGLYCERIN (NITROSTAT) 0.4 MG SL tablet Place 1 tablet (0.4 mg total) under the tongue every 5 (five) minutes as needed for chest pain.   nystatin (NYSTATIN) powder Apply 1 application topically daily. (Patient taking differently: Apply 1 application. topically daily as needed.)   ondansetron (ZOFRAN-ODT) 8 MG disintegrating tablet Take 1 tablet (8 mg total) by mouth every 8 (eight) hours as needed for refractory nausea / vomiting.   pantoprazole (PROTONIX) 20 MG tablet Take 1 tablet (20 mg total) by mouth 2 (two) times daily.   rosuvastatin (CRESTOR) 40 MG tablet Take 1 tablet (40 mg total) by mouth daily.   sacubitril-valsartan (ENTRESTO) 24-26 MG Take 1 tablet by mouth 2 (two) times daily.   sertraline (ZOLOFT) 100 MG tablet Take 1 tablet (100 mg total) by mouth daily.   No facility-administered medications prior to visit.    Review of Systems  Constitutional:  Negative for appetite change,  chills, fatigue and fever.  Respiratory:  Negative for chest tightness and shortness of breath.   Cardiovascular:  Negative for chest pain and palpitations.  Gastrointestinal:  Negative for abdominal pain, nausea and vomiting.  Neurological:  Negative for dizziness and weakness.   {Labs  Heme  Chem  Endocrine  Serology  Results Review (optional):23779}   Objective    There were no vitals taken for this visit. {Show previous vital signs (optional):23777}  Physical Exam  ***  No results found for any visits on 07/15/21.  Assessment & Plan     ***  No follow-ups on file.      {provider attestation***:1}   [name], MD  [facility] [phone] (phone) [phone] (fax)  [place]

## 2021-07-15 NOTE — Plan of Care (Signed)

## 2021-07-15 NOTE — ED Triage Notes (Signed)
Pt comes into the ED via POV c/o left side chest pain that radiates into the left arm and back.  Pt states the pain started 2 weeks ago and the patient states it has been getting worse.  Pt went to the heart clinic this morning and they brought the patient here to be evaluated.  Pt in NAD at this time with even and unlabored respirations.  Pt has SHOB, dizziness, and nausea.  ?

## 2021-07-15 NOTE — Progress Notes (Signed)
[place] COUNSELING NOTE ? ?*HFpEF with mild left ventricular hypertrophy* ? ?Guideline-Directed Medical Therapy/Evidence Based Medicine ? ?ACE/ARB/ARNI: Sacubitril-valsartan 24-26 mg twice daily ?Beta Blocker: Metoprolol tartrate 100 mg twice daily ?Aldosterone Antagonist:  none ?Diuretic: Furosemide 20 mg daily ?SGLT2i: Empagliflozin 25 mg daily ? ?Adherence Assessment ? ?Do you ever forget to take your medication? _0 Yes ?_1 No  ?Do you ever skip doses due to side effects? _2 Yes ?_3 No  ?Do you have trouble affording your medicines? _4 Yes ?_5 No  ?Are you ever unable to pick up your medication due to transportation difficulties? _6 Yes ?_7 No  ?Do you ever stop taking your medications because you don't believe they are helping? _8 Yes ?_9 No  ?Do you check your weight daily? _10 Yes ?_11 No  ? ?Adherence strategy: Upstream pre-pack medication ? ?Barriers to obtaining medications: none ? ?Vital signs: HR 80, BP 102/68, weight (pounds) 173 lbs ?ECHO: Date 06/18/20, EF 60-65%, notes mild left ventricular hypertrophy ? ? ?  Latest Ref Rng & Units 06/10/2021  ?  4:03 PM 02/09/2021  ?  6:31 AM 02/08/2021  ? 11:45 AM  ?BMP  ?Glucose 70 - 99 mg/dL 212   104   120    ?BUN 8 - 27 mg/dL _12 ?Creatinine 0.57 - 1.00 mg/dL 0.95   0.69   0.68    ?BUN/Creat Ratio 12 - 28 18      ?Sodium 134 - 144 mmol/L 143   139   140    ?Potassium 3.5 - 5.2 mmol/L 4.3   3.9   4.2    ?Chloride 96 - 106 mmol/L 107   108   109    ?CO2 20 - 29 mmol/L _13 ?Calcium 8.7 - 10.3 mg/dL 9.0   9.0   8.7    ? ? ?Past Medical History:  ?Diagnosis Date  ? Allergy   ? Anemia   ? Anxiety   ? Arrhythmia   ? Arthritis   ? Atrial fibrillation ([place])   ? CHF (congestive heart failure) ([place])   ? COPD (chronic obstructive pulmonary disease) ([place])   ? Coronary artery disease   ? Depression   ? Diabetes mellitus without complication ([place])   ? Dyspnea   ? doe  ? Dysrhythmia   ? GERD  (gastroesophageal reflux disease)   ? Headache   ? History of hiatal hernia   ? Hyperlipidemia   ? Hypertension   ? Myocardial infarction [facility])   ? 2016, 04/2017  ? Myocardial infarction with cardiac rehabilitation [facility])   ? MI 2016/ CABG 8/17    FINISHED CARDIAC REHAB 3 WEEKS AGO  ? Panic attack   ? Pneumonia   ? Reflux   ? Stroke [facility]) 2015  ? showed up on MRI; no weakness noted  ? TIA (transient ischemic attack)   ? Voice tremor   ? ? ?ASSESSMENT ?71 year old female who presents to the HF clinic for follow up. PMH includes history of CAD, DM, hyperlipidemia, HTN, stroke, anemia, anxiety, atrial fibrillation, COPD, depression, GERD, panic attacks, TIA and chronic heart failure. Noted last ED visit was 02/08/2021 for chest pain. ? ? ? Patient presents accompany by daughter and expressed no feeling well. Reports diarrhea, nausea, chest pain radiating to back and left arm, and weigh loss of 5lbs in 24 hrs. ? ?PLAN (Recommendations) ? ?NP to assess chest pain. Note ECG already ?Repeat  BMP todayDecrease furosemide to 59m PRN (patient showing potential dehydration) ?Continue all other medication as prescribed ? ? ?Time spent: 30 minutes ? ?Asyah Candler Rodriguez-Guzman PharmD, BCPS ?07/15/2021 3:19 PM ? ? ? ?Current Outpatient Medications:  ?  acetaminophen (TYLENOL) 325 MG tablet, Take 650 mg by mouth every 6 (six) hours as needed for moderate pain or headache. , Disp: , Rfl:  ?  albuterol (VENTOLIN HFA) 108 (90 Base) MCG/ACT inhaler, Inhale 2 puffs into the lungs every 6 (six) hours as needed for wheezing or shortness of breath. (Patient not taking: Reported on 07/15/2021), Disp: 8 g, Rfl: 2 ?  bismuth subsalicylate (PEPTO BISMOL) 262 MG chewable tablet, Chew 524 mg by mouth daily as needed for diarrhea or loose stools or indigestion. (Patient not taking: Reported on 07/15/2021), Disp: , Rfl:  ?  Blood Glucose Monitoring Suppl (ONETOUCH VERIO) w/Device KIT, Use daily to check blood sugar, Disp: 1 kit, Rfl: 0 ?  clopidogrel  (PLAVIX) 75 MG tablet, Take 1 tablet (75 mg total) by mouth daily., Disp: 90 tablet, Rfl: 0 ?  Dulaglutide (TRULICITY) 1.5 MZD/6.3OVSOPN, Inject 1.5 mg into the skin once a week., Disp: 2 mL, Rfl: 2 ?  ELIQUIS 5 MG TABS tablet, Take 5 mg by mouth 2 (two) times daily., Disp: , Rfl:  ?  empagliflozin (JARDIANCE) 25 MG TABS tablet, Take 1 tablet (25 mg total) by mouth daily., Disp: 90 tablet, Rfl: 1 ?  ezetimibe (ZETIA) 10 MG tablet, Take 1 tablet (10 mg total) by mouth daily., Disp: 90 tablet, Rfl: 0 ?  furosemide (LASIX) 20 MG tablet, Take 20 mg by mouth daily., Disp: , Rfl:  ?  glucose blood test strip, Use to check blood sugars daily as instructed, Disp: 100 each, Rfl: 12 ?  insulin glargine (LANTUS SOLOSTAR) 100 UNIT/ML Solostar Pen, Inject 18 Units into the skin at bedtime., Disp: 18 mL, Rfl: 3 ?  Insulin Pen Needle 32G X 4 MM MISC, Use to inject insulin daily, Disp: 100 each, Rfl: 1 ?  isosorbide mononitrate (IMDUR) 120 MG 24 hr tablet, Take 1 tablet (120 mg total) by mouth daily., Disp: 90 tablet, Rfl: 1 ?  Lancets (ONETOUCH DELICA PLUS [id number] MISC, USE UP TO 4 TIMES DAILY AS DIRECTED, Disp: 100 each, Rfl: 0 ?  loperamide (IMODIUM) 2 MG capsule, Take 2 mg by mouth daily as needed for diarrhea or loose stools., Disp: , Rfl:  ?  metoprolol tartrate (LOPRESSOR) 100 MG tablet, Take 100 mg by mouth 2 (two) times daily., Disp: , Rfl:  ?  nitroGLYCERIN (NITROSTAT) 0.4 MG SL tablet, Place 1 tablet (0.4 mg total) under the tongue every 5 (five) minutes as needed for chest pain., Disp: 30 tablet, Rfl: 0 ?  nystatin (NYSTATIN) powder, Apply 1 application topically daily. (Patient not taking: Reported on 07/15/2021), Disp: 60 g, Rfl: 5 ?  ondansetron (ZOFRAN-ODT) 8 MG disintegrating tablet, Take 1 tablet (8 mg total) by mouth every 8 (eight) hours as needed for refractory nausea / vomiting., Disp: 30 tablet, Rfl: 1 ?  pantoprazole (PROTONIX) 20 MG tablet, Take 1 tablet (20 mg total) by mouth 2 (two) times daily., Disp:  60 tablet, Rfl: 11 ?  rosuvastatin (CRESTOR) 40 MG tablet, Take 1 tablet (40 mg total) by mouth daily., Disp: 90 tablet, Rfl: 1 ?  sacubitril-valsartan (ENTRESTO) 24-26 MG, Take 1 tablet by mouth 2 (two) times daily., Disp: 60 tablet, Rfl: 3 ?  sertraline (ZOLOFT) 100 MG tablet, Take 1 tablet (100 mg total) by mouth  daily., Disp: 90 tablet, Rfl: 1 ? ?

## 2021-07-15 NOTE — Consult Note (Signed)
?CARDIOLOGY CONSULT NOTE  ? ?   ?    ?  ? ? ? ?Patient ID: ?Candace [name] ?MRN: [id number] ?DOB/AGE: 08/28/1950 71 y.o. ? ?Admit date: 07/15/2021 ?Referring Physician Dr. Sherilyn Dacosta hospitalist ?Primary Physician Dr. Miguel Aschoff primary ?Primary Cardiologist  ?Reason for Consultation chest pain abnormal EKG known coronary artery disease ? ?HPI: Patient is a 71 year old female multivessel coronary disease previous coronary bypass surgery 2017 congestive heart failure chronic diastolic dysfunction diabetes paroxysmal atrial fibrillation baseline EKG with left bundle patient complains of recent intermittent chronic chest pain and shortness of breath over the last few days with continued discomfort patient finally came to emergency room for evaluation.  Patient admits to eating Chinese food on a regular basis making her feel weak and tired denies any back as well as syncope no nausea vomiting or diarrhea complains of intermittent chest pain but no pain currently ? ?Review of systems complete and found to be negative unless listed above  ? ? ? ?Past Medical History:  ?Diagnosis Date  ? Allergy   ? Anemia   ? Anxiety   ? Arrhythmia   ? Arthritis   ? Atrial fibrillation ([place])   ? CHF (congestive heart failure) ([place])   ? COPD (chronic obstructive pulmonary disease) ([place])   ? Coronary artery disease   ? Depression   ? Diabetes mellitus without complication ([place])   ? Dyspnea   ? doe  ? Dysrhythmia   ? GERD (gastroesophageal reflux disease)   ? Headache   ? History of hiatal hernia   ? Hyperlipidemia   ? Hypertension   ? Myocardial infarction [facility])   ? 2016, 04/2017  ? Myocardial infarction with cardiac rehabilitation [facility])   ? MI 2016/ CABG 8/17    FINISHED CARDIAC REHAB 3 WEEKS AGO  ? Panic attack   ? Pneumonia   ? Reflux   ? Stroke [facility]) 2015  ? showed up on MRI; no weakness noted  ? TIA (transient ischemic attack)   ? Voice tremor   ?  ?Past Surgical History:  ?Procedure Laterality Date  ? ABDOMINAL HYSTERECTOMY     ? APPENDECTOMY  1975  ? ARTERY BIOPSY Right 04/26/2016  ? Procedure: BIOPSY TEMPORAL ARTERY;  Surgeon: Margaretha Sheffield, MD;  Location: [facility] ORS;  Service: ENT;  Laterality: Right;  ? CARDIAC CATHETERIZATION N/A 11/06/2015  ? Procedure: Left Heart Cath and Coronary Angiography;  Surgeon: Corey Skains, MD;  Location: [place] CV LAB;  Service: Cardiovascular;  Laterality: N/A;  ? CESAREAN SECTION    ? COLONOSCOPY  2015  ? COLONOSCOPY WITH PROPOFOL N/A 11/03/2018  ? Procedure: COLONOSCOPY WITH PROPOFOL;  Surgeon: Virgel Manifold, MD;  Location: [facility] ENDOSCOPY;  Service: Endoscopy;  Laterality: N/A;  ? CORONARY ANGIOPLASTY  04/2017  ? [place]  ? CORONARY ARTERY BYPASS GRAFT N/A 11/10/2015  ? Procedure: CORONARY ARTERY BYPASS GRAFTING (CABG), ON PUMP, TIMES FOUR, USING LEFT INTERNAL MAMMARY ARTERY, BILATERAL GREATER SAPHENOUS VEINS HARVESTED ENDOSCOPICALLY;  Surgeon: [name], MD;  Location: [place];  Service: Open Heart Surgery;  Laterality: N/A;  LIMA-LAD; SEQ SVG-OM1-OM2; SVG-PL  ? CORONARY STENT INTERVENTION N/A 08/05/2016  ? Procedure: Coronary Stent Intervention;  Surgeon: Isaias Cowman, MD;  Location: [place] CV LAB;  Service: Cardiovascular;  Laterality: N/A;  ? ESOPHAGOGASTRODUODENOSCOPY (EGD) WITH PROPOFOL N/A 11/03/2018  ? Procedure: ESOPHAGOGASTRODUODENOSCOPY (EGD) WITH PROPOFOL;  Surgeon: Virgel Manifold, MD;  Location: [facility] ENDOSCOPY;  Service: Endoscopy;  Laterality: N/A;  ? HYSTERECTOMY ABDOMINAL  WITH SALPINGO-OOPHORECTOMY Bilateral 08/15/2017  ? Procedure: HYSTERECTOMY ABDOMINAL WITH BILATERAL SALPINGO-OOPHORECTOMY;  Surgeon: Rubie Maid, MD;  Location: [facility] ORS;  Service: Gynecology;  Laterality: Bilateral;  ? LEFT HEART CATH AND CORONARY ANGIOGRAPHY N/A 08/05/2016  ? Procedure: Left Heart Cath and Coronary Angiography;  Surgeon: Isaias Cowman, MD;  Location: [place] CV LAB;  Service: Cardiovascular;  Laterality: N/A;  ? LEFT HEART CATH AND  CORS/GRAFTS ANGIOGRAPHY N/A 09/19/2018  ? Procedure: LEFT HEART CATH AND CORS/GRAFTS ANGIOGRAPHY;  Surgeon: Corey Skains, MD;  Location: [place] CV LAB;  Service: Cardiovascular;  Laterality: N/A;  ? LEFT HEART CATH AND CORS/GRAFTS ANGIOGRAPHY N/A 08/30/2019  ? Procedure: LEFT HEART CATH AND CORS/GRAFTS ANGIOGRAPHY;  Surgeon: Teodoro Spray, MD;  Location: [place] CV LAB;  Service: Cardiovascular;  Laterality: N/A;  ? OOPHORECTOMY    ? TEE WITHOUT CARDIOVERSION N/A 11/10/2015  ? Procedure: TRANSESOPHAGEAL ECHOCARDIOGRAM (TEE);  Surgeon: [name], MD;  Location: [place];  Service: Open Heart Surgery;  Laterality: N/A;  ? TUBAL LIGATION    ?  ?(Not in a hospital admission) ? ?Social History  ? ?Socioeconomic History  ? Marital status: Divorced  ?  Spouse name: Not on file  ? Number of children: 3  ? Years of education: Not on file  ? Highest education level: Some college, no degree  ?Occupational History  ? Occupation: retired  ?Tobacco Use  ? Smoking status: Former  ?  Packs/day: 0.50  ?  Types: Cigarettes  ?  Quit date: 10/07/2001  ?  Years since quitting: 19.7  ? Smokeless tobacco: Never  ?Vaping Use  ? Vaping Use: Never used  ?Substance and Sexual Activity  ? Alcohol use: No  ?  Alcohol/week: 0.0 standard drinks  ? Drug use: No  ? Sexual activity: Not Currently  ?Other Topics Concern  ? Not on file  ?Social History Narrative  ? Lives at home alone  ? ?Social Determinants of Health  ? ?Financial Resource Strain: Low Risk   ? Difficulty of Paying Living Expenses: Not hard at all  ?Food Insecurity: Not on file  ?Transportation Needs: Not on file  ?Physical Activity: Not on file  ?Stress: Not on file  ?Social Connections: Not on file  ?Intimate Partner Violence: Not on file  ?  ?Family History  ?Problem Relation Age of Onset  ? Cancer Father   ? Hypertension Father   ? Heart disease Father   ? Asthma Father   ? Cancer Mother   ? Hypertension Mother   ? Pancreatic cancer Mother 18  ? Cancer Sister    ? Breast cancer Sister 60  ? Breast cancer Sister 62  ? Lung cancer Brother   ? Pancreatic cancer Sister 26  ? Cancer Sister   ?  ? ? ?Review of systems complete and found to be negative unless listed above  ? ? ? ? ?PHYSICAL EXAM ? ?General: Well developed, well nourished, in no acute distress ?HEENT:  Normocephalic and atramatic ?Neck:  No JVD.  ?Lungs: Clear bilaterally to auscultation and percussion. ?Heart: HRRR . Normal S1 and S2 without gallops or murmurs.  ?Abdomen: Bowel sounds are positive, abdomen soft and non-tender  ?Msk:  Back normal, normal gait. Normal strength and tone for age. ?Extremities: No clubbing, cyanosis or edema.   ?Neuro: Alert and oriented X 3. ?Psych:  Good affect, responds appropriately ? ?Labs: ?  ?Lab Results  ?Component Value Date  ? WBC 6.8 07/15/2021  ? HGB 12.2 07/15/2021  ?  HCT 37.5 07/15/2021  ? MCV 96.2 07/15/2021  ? PLT 179 07/15/2021  ?  ?Recent Labs  ?Lab 07/15/21 ?1128  ?NA 145  ?K 4.3  ?CL 111  ?CO2 28  ?BUN 10  ?CREATININE 0.80  ?CALCIUM 8.9  ?PROT 6.5  ?BILITOT 0.5  ?ALKPHOS 88  ?ALT 38  ?AST 49*  ?GLUCOSE 126*  ? ?Lab Results  ?Component Value Date  ? CKTOTAL 91 08/01/2012  ? CKMB 5.9 (H) 07/14/2013  ? TROPONINI <0.03 09/14/2018  ?  ?Lab Results  ?Component Value Date  ? CHOL 164 08/11/2020  ? CHOL 234 (H) 08/31/2019  ? CHOL 214 (H) 01/03/2019  ? ?Lab Results  ?Component Value Date  ? HDL 57 08/11/2020  ? HDL 49 08/31/2019  ? HDL 53 01/03/2019  ? ?Lab Results  ?Component Value Date  ? [place] 90 08/11/2020  ? LDLCALC 163 (H) 08/31/2019  ? LDLCALC 147 (H) 01/03/2019  ? ?Lab Results  ?Component Value Date  ? TRIG 94 08/11/2020  ? TRIG 110 08/31/2019  ? TRIG 77 01/03/2019  ? ?Lab Results  ?Component Value Date  ? CHOLHDL 2.9 08/11/2020  ? CHOLHDL 4.8 08/31/2019  ? CHOLHDL 4.0 01/03/2019  ? ?No results found for: LDLDIRECT  ?  ?Radiology: DG Chest 2 View ? ?Result Date: 07/15/2021 ?CLINICAL DATA:  Chest pain EXAM: CHEST - 2 VIEW COMPARISON:  Radiograph 06/10/2021, chest CT  04/11/2020 FINDINGS: Unchanged cardiomediastinal silhouette with prior median sternotomy CABG. There is no focal airspace consolidation. There is unchanged reticular opacities in the right peripheral uppe

## 2021-07-15 NOTE — H&P (Signed)
? ? ?History and Physical:  ? ? ?Candace [name]  ? ?ZOX:[id number] DOB: 1951-03-03 DOA: 07/15/2021 ? ?Referring MD/provider: PA Ashok Cordia ? ?PCP: [name]., MD  ? ?Patient coming from: Home ? ?Chief Complaint: Chest pain ? ?History of Present Illness:  ? ?Candace [name] is an 71 y.o. female with CAD s/p CABG 2017, HFpEF, DM 2, PAF with multiple admissions for chest pain presents with chest pain.  Patient states that she has chronic intermittent chest pain for a long time.  Notes that her baseline chest pain is both right and left-sided and is not associated with exertion or shortness of breath.  Patient notes that over the past 2 to 3 days, she has had a squeezing sensation that is substernal that is associated with dyspnea on exertion.  Patient and daughter both note that she has had intermittent fatigue and weakness over the past 1 to 2 years which responds to fluids.  Patient was seen in advanced heart failure clinic earlier today and was told to come to the ED for possible "dehydration".  Patient denies fevers or chills, no new cough, no nausea or vomiting, no change in p.o. intake.  Patient does admit to eating [place] food on a reasonably regular basis and notes that "she gets weak and tired" after that.  Patient denies chest pain at rest or with exertion. ? ?ED Course:  The patient was noted to be normotensive and afebrile.  EKG was without any change.  Troponins were negative.  Patient was discussed with Dr. Towanda Malkin who recommended admission for observation. ? ?ROS:  ? ?ROS  ? ?Review of Systems: ?As per HPI ? ?Past Medical History:  ? ?Past Medical History:  ?Diagnosis Date  ? Allergy   ? Anemia   ? Anxiety   ? Arrhythmia   ? Arthritis   ? Atrial fibrillation ([place])   ? CHF (congestive heart failure) ([place])   ? COPD (chronic obstructive pulmonary disease) ([place])   ? Coronary artery disease   ? Depression   ? Diabetes mellitus without complication ([place])   ? Dyspnea   ? doe  ? Dysrhythmia   ?  GERD (gastroesophageal reflux disease)   ? Headache   ? History of hiatal hernia   ? Hyperlipidemia   ? Hypertension   ? Myocardial infarction [facility])   ? 2016, 04/2017  ? Myocardial infarction with cardiac rehabilitation [facility])   ? MI 2016/ CABG 8/17    FINISHED CARDIAC REHAB 3 WEEKS AGO  ? Panic attack   ? Pneumonia   ? Reflux   ? Stroke [facility]) 2015  ? showed up on MRI; no weakness noted  ? TIA (transient ischemic attack)   ? Voice tremor   ? ? ?Past Surgical History:  ? ?Past Surgical History:  ?Procedure Laterality Date  ? ABDOMINAL HYSTERECTOMY    ? APPENDECTOMY  1975  ? ARTERY BIOPSY Right 04/26/2016  ? Procedure: BIOPSY TEMPORAL ARTERY;  Surgeon: Margaretha Sheffield, MD;  Location: [facility] ORS;  Service: ENT;  Laterality: Right;  ? CARDIAC CATHETERIZATION N/A 11/06/2015  ? Procedure: Left Heart Cath and Coronary Angiography;  Surgeon: Corey Skains, MD;  Location: [place] CV LAB;  Service: Cardiovascular;  Laterality: N/A;  ? CESAREAN SECTION    ? COLONOSCOPY  2015  ? COLONOSCOPY WITH PROPOFOL N/A 11/03/2018  ? Procedure: COLONOSCOPY WITH PROPOFOL;  Surgeon: Virgel Manifold, MD;  Location: [facility] ENDOSCOPY;  Service: Endoscopy;  Laterality: N/A;  ? CORONARY ANGIOPLASTY  04/2017  ? [place]  ? CORONARY ARTERY BYPASS GRAFT N/A 11/10/2015  ? Procedure: CORONARY ARTERY BYPASS GRAFTING (CABG), ON PUMP, TIMES FOUR, USING LEFT INTERNAL MAMMARY ARTERY, BILATERAL GREATER SAPHENOUS VEINS HARVESTED ENDOSCOPICALLY;  Surgeon: [name], MD;  Location: [place];  Service: Open Heart Surgery;  Laterality: N/A;  LIMA-LAD; SEQ SVG-OM1-OM2; SVG-PL  ? CORONARY STENT INTERVENTION N/A 08/05/2016  ? Procedure: Coronary Stent Intervention;  Surgeon: Isaias Cowman, MD;  Location: [place] CV LAB;  Service: Cardiovascular;  Laterality: N/A;  ? ESOPHAGOGASTRODUODENOSCOPY (EGD) WITH PROPOFOL N/A 11/03/2018  ? Procedure: ESOPHAGOGASTRODUODENOSCOPY (EGD) WITH PROPOFOL;  Surgeon: Virgel Manifold, MD;  Location:  [facility] ENDOSCOPY;  Service: Endoscopy;  Laterality: N/A;  ? HYSTERECTOMY ABDOMINAL WITH SALPINGO-OOPHORECTOMY Bilateral 08/15/2017  ? Procedure: HYSTERECTOMY ABDOMINAL WITH BILATERAL SALPINGO-OOPHORECTOMY;  Surgeon: Rubie Maid, MD;  Location: [facility] ORS;  Service: Gynecology;  Laterality: Bilateral;  ? LEFT HEART CATH AND CORONARY ANGIOGRAPHY N/A 08/05/2016  ? Procedure: Left Heart Cath and Coronary Angiography;  Surgeon: Isaias Cowman, MD;  Location: [place] CV LAB;  Service: Cardiovascular;  Laterality: N/A;  ? LEFT HEART CATH AND CORS/GRAFTS ANGIOGRAPHY N/A 09/19/2018  ? Procedure: LEFT HEART CATH AND CORS/GRAFTS ANGIOGRAPHY;  Surgeon: Corey Skains, MD;  Location: [place] CV LAB;  Service: Cardiovascular;  Laterality: N/A;  ? LEFT HEART CATH AND CORS/GRAFTS ANGIOGRAPHY N/A 08/30/2019  ? Procedure: LEFT HEART CATH AND CORS/GRAFTS ANGIOGRAPHY;  Surgeon: Teodoro Spray, MD;  Location: [place] CV LAB;  Service: Cardiovascular;  Laterality: N/A;  ? OOPHORECTOMY    ? TEE WITHOUT CARDIOVERSION N/A 11/10/2015  ? Procedure: TRANSESOPHAGEAL ECHOCARDIOGRAM (TEE);  Surgeon: [name], MD;  Location: [place];  Service: Open Heart Surgery;  Laterality: N/A;  ? TUBAL LIGATION    ? ? ?Social History:  ? ?Social History  ? ?Socioeconomic History  ? Marital status: Divorced  ?  Spouse name: Not on file  ? Number of children: 3  ? Years of education: Not on file  ? Highest education level: Some college, no degree  ?Occupational History  ? Occupation: retired  ?Tobacco Use  ? Smoking status: Former  ?  Packs/day: 0.50  ?  Types: Cigarettes  ?  Quit date: 10/07/2001  ?  Years since quitting: 19.7  ? Smokeless tobacco: Never  ?Vaping Use  ? Vaping Use: Never used  ?Substance and Sexual Activity  ? Alcohol use: No  ?  Alcohol/week: 0.0 standard drinks  ? Drug use: No  ? Sexual activity: Not Currently  ?Other Topics Concern  ? Not on file  ?Social History Narrative  ? Lives at home alone  ? ?Social  Determinants of Health  ? ?Financial Resource Strain: Low Risk   ? Difficulty of Paying Living Expenses: Not hard at all  ?Food Insecurity: Not on file  ?Transportation Needs: Not on file  ?Physical Activity: Not on file  ?Stress: Not on file  ?Social Connections: Not on file  ?Intimate Partner Violence: Not on file  ? ? ?Allergies  ? ?Lisinopril and Penicillins ? ?Family history:  ? ?Family History  ?Problem Relation Age of Onset  ? Cancer Father   ? Hypertension Father   ? Heart disease Father   ? Asthma Father   ? Cancer Mother   ? Hypertension Mother   ? Pancreatic cancer Mother 34  ? Cancer Sister   ? Breast cancer Sister 17  ? Breast cancer Sister 65  ? Lung cancer Brother   ? Pancreatic  cancer Sister 69  ? Cancer Sister   ? ? ?Current Medications:  ? ?Prior to Admission medications   ?Medication Sig Start Date End Date Taking? Authorizing Provider  ?acetaminophen (TYLENOL) 500 MG tablet Take 500 mg by mouth every 8 (eight) hours as needed. 08/18/17  Yes [provider]  ?clopidogrel (PLAVIX) 75 MG tablet Take 1 tablet (75 mg total) by mouth daily. 09/05/20  Yes [name]., MD  ?Arne [place] 5 MG TABS tablet Take 5 mg by mouth 2 (two) times daily. 03/26/20  Yes [provider]  ?empagliflozin (JARDIANCE) 25 MG TABS tablet Take 1 tablet (25 mg total) by mouth daily. 06/30/21  Yes Mikey Kirschner, PA-C  ?ezetimibe (ZETIA) 10 MG tablet Take 1 tablet (10 mg total) by mouth daily. 09/05/20  Yes [name]., MD  ?insulin glargine (LANTUS SOLOSTAR) 100 UNIT/ML Solostar Pen Inject 18 Units into the skin at bedtime. 06/30/21  Yes Mikey Kirschner, PA-C  ?isosorbide mononitrate (IMDUR) 120 MG 24 hr tablet Take 1 tablet (120 mg total) by mouth daily. 07/08/21  Yes [name]., MD  ?metoprolol tartrate (LOPRESSOR) 100 MG tablet Take 100 mg by mouth 2 (two) times daily. 10/03/18  Yes [provider]  ?ondansetron (ZOFRAN-ODT) 8 MG disintegrating tablet Take 1 tablet (8 mg total)  by mouth every 8 (eight) hours as needed for refractory nausea / vomiting. 06/30/21  Yes Mikey Kirschner, PA-C  ?pantoprazole (PROTONIX) 20 MG tablet Take 1 tablet (20 mg total) by mouth 2 (two) times daily. 2/17

## 2021-07-16 ENCOUNTER — Observation Stay: Payer: 59

## 2021-07-16 ENCOUNTER — Observation Stay
Admit: 2021-07-16 | Discharge: 2021-07-16 | Disposition: A | Discharge status: Home / Self Care | Payer: 59 | Attending: Internal Medicine | Admitting: Internal Medicine

## 2021-07-16 DIAGNOSIS — R079 Chest pain, unspecified: Secondary | ICD-10-CM

## 2021-07-16 DIAGNOSIS — E1169 Type 2 diabetes mellitus with other specified complication: Secondary | ICD-10-CM

## 2021-07-16 DIAGNOSIS — I480 Paroxysmal atrial fibrillation: Secondary | ICD-10-CM

## 2021-07-16 LAB — ECHOCARDIOGRAM COMPLETE
AR max vel: 1.82 cm2
AV Area VTI: 2.88 cm2
AV Area mean vel: 1.94 cm2
AV Mean grad: 4 mmHg
AV Peak grad: 8.3 mmHg
Ao pk vel: 1.44 m/s
Area-P 1/2: 4.63 cm2
Height: 62 in
S' Lateral: 2.7 cm
Weight: 2768.98 oz

## 2021-07-16 LAB — NM MYOCAR MULTI W/SPECT W/WALL MOTION / EF
LV dias vol: 43 mL (ref 46–106)
LV sys vol: 15 mL
Nuc Stress EF: 65 %
Rest Nuclear Isotope Dose: 10.2 mCi
SDS: 0
SRS: 11
SSS: 0
ST Depression (mm): 0 mm
Stress Nuclear Isotope Dose: 30.5 mCi
TID: 1

## 2021-07-16 LAB — GLUCOSE, CAPILLARY
Glucose-Capillary: 109 mg/dL — ABNORMAL HIGH (ref 70–99)
Glucose-Capillary: 135 mg/dL — ABNORMAL HIGH (ref 70–99)
Glucose-Capillary: 134 mg/dL — ABNORMAL HIGH (ref 70–99)
Glucose-Capillary: 165 mg/dL — ABNORMAL HIGH (ref 70–99)

## 2021-07-16 LAB — HIV ANTIBODY (ROUTINE TESTING W REFLEX): HIV Screen 4th Generation wRfx: NONREACTIVE

## 2021-07-16 MED ORDER — TECHNETIUM TC 99M TETROFOSMIN IV KIT
30.0000 | PACK | Freq: Once | INTRAVENOUS | Status: AC | PRN
  Administered 2021-07-16: 30.5 via INTRAVENOUS

## 2021-07-16 MED ORDER — REGADENOSON 0.4 MG/5ML IV SOLN
0.4000 mg | Freq: Once | INTRAVENOUS | Status: AC
  Administered 2021-07-16: 0.4 mg via INTRAVENOUS
  Filled 2021-07-16: qty 5

## 2021-07-16 MED ORDER — TECHNETIUM TC 99M TETROFOSMIN IV KIT
10.0000 | PACK | Freq: Once | INTRAVENOUS | Status: AC | PRN
  Administered 2021-07-16: 10.24 via INTRAVENOUS

## 2021-07-16 NOTE — Progress Notes (Signed)
?PROGRESS NOTE ? ? ? ?Candace [name]  ZOX:[id number] DOB: [birth year] DOA: 07/15/2021 ?PCP: [name]., MD  ? ?Assessment & Plan: ?  ?Principal Problem: ?  Chest pain ? ? ?Chest pain: w/ hx of CAD & CHF.  Continue on metoprolol, aspirin, plavix, imdur, entresto & statin. Will go for stress test today  ? ?HLD: continue on statin, zetia ?  ?PAF: continue metoprolol, eliquis ?  ?DM2: likely poorly controlled. Continue on glargine, SSI w/ accuchecks ?  ?Depression: severity unknown. Continue on home dose of sertraline  ?  ?GERD: continue on PPI ? ? ? ?DVT prophylaxis: eliquis ?Code Status: full  ?Family Communication:  ?Disposition Plan: d/c  ? ?Level of care: Telemetry Medical ? ?Status is: Observation ?The patient remains OBS appropriate and will d/c before 2 midnights. ? ? ? ?Consultants:  ?Cardio  ? ?Procedures:  ? ?Antimicrobials:  ? ? ?Subjective: ?Pt c/o chest pain  ? ?Objective: ?Vitals:  ? 07/15/21 1811 07/15/21 2018 07/15/21 2346 07/16/21 0317  ?BP: 117/69 116/62 124/64 (!) 103/57  ?Pulse: 67 67 74 76  ?Resp: '17 20 20 16  '$ ?Temp: 98.2 ?F (36.8 ?C) 98.2 ?F (36.8 ?C) 97.7 ?F (36.5 ?C) 97.9 ?F (36.6 ?C)  ?TempSrc:      ?SpO2: 99% 95% 96% 94%  ?Weight:      ?Height:      ? ? ?Intake/Output Summary (Last 24 hours) at 07/16/2021 0754 ?Last data filed at 07/15/2021 2100 ?Gross per 24 hour  ?Intake 120 ml  ?Output --  ?Net 120 ml  ? ?Filed Weights  ? 07/15/21 1040  ?Weight: 78.5 kg  ? ? ?Examination: ? ?General exam: Appears calm and comfortable  ?Respiratory system: Clear to auscultation. Respiratory effort normal. ?Cardiovascular system: S1 & S2 +. No rubs, gallops or clicks. No pedal edema. ?Gastrointestinal system: Abdomen is nondistended, soft and nontender.Normal bowel sounds heard. ?Central nervous system: Alert and oriented.Moves all extremities  ?Psychiatry: Judgement and insight appear normal. Mood & affect appropriate.  ? ? ? ?Data Reviewed: I have personally reviewed following labs and imaging  studies ? ?CBC: ?Recent Labs  ?Lab 07/15/21 ?1128  ?WBC 6.8  ?HGB 12.2  ?HCT 37.5  ?MCV 96.2  ?PLT 179  ? ?Basic Metabolic Panel: ?Recent Labs  ?Lab 07/15/21 ?1128  ?NA 145  ?K 4.3  ?CL 111  ?CO2 28  ?GLUCOSE 126*  ?BUN 10  ?CREATININE 0.80  ?CALCIUM 8.9  ? ?GFR: ?Estimated Creatinine Clearance: 63.5 mL/min (by C-G formula based on SCr of 0.8 mg/dL). ?Liver Function Tests: ?Recent Labs  ?Lab 07/15/21 ?1128  ?AST 49*  ?ALT 38  ?ALKPHOS 88  ?BILITOT 0.5  ?PROT 6.5  ?ALBUMIN 3.5  ? ?Recent Labs  ?Lab 07/15/21 ?1128  ?LIPASE 35  ? ?No results for input(s): AMMONIA in the last 168 hours. ?Coagulation Profile: ?No results for input(s): INR, PROTIME in the last 168 hours. ?Cardiac Enzymes: ?No results for input(s): CKTOTAL, CKMB, CKMBINDEX, TROPONINI in the last 168 hours. ?BNP (last 3 results) ?Recent Labs  ?  06/10/21 ?1603  ?PROBNP 189  ? ?HbA1C: ?No results for input(s): HGBA1C in the last 72 hours. ?CBG: ?Recent Labs  ?Lab 07/15/21 ?1730 07/15/21 ?1807 07/15/21 ?2055  ?GLUCAP 75 81 162*  ? ?Lipid Profile: ?No results for input(s): CHOL, HDL, LDLCALC, TRIG, CHOLHDL, LDLDIRECT in the last 72 hours. ?Thyroid Function Tests: ?No results for input(s): TSH, T4TOTAL, FREET4, T3FREE, THYROIDAB in the last 72 hours. ?Anemia Panel: ?No results for input(s): VITAMINB12, FOLATE, FERRITIN,  TIBC, IRON, RETICCTPCT in the last 72 hours. ?Sepsis Labs: ?No results for input(s): PROCALCITON, LATICACIDVEN in the last 168 hours. ? ?No results found for this or any previous visit (from the past 240 hour(s)).  ? ? ? ? ? ?Radiology Studies: ?DG Chest 2 View ? ?Result Date: 07/15/2021 ?CLINICAL DATA:  Chest pain EXAM: CHEST - 2 VIEW COMPARISON:  Radiograph 06/10/2021, chest CT 04/11/2020 FINDINGS: Unchanged cardiomediastinal silhouette with prior median sternotomy CABG. There is no focal airspace consolidation. There is unchanged reticular opacities in the right peripheral upper lung related to peripheral scarring seen on prior chest CT. There  is no large pleural effusion. There is no visible pneumothorax. There is bilateral shoulder osteoarthritis. There is no acute osseous abnormality. Thoracic spondylosis. IMPRESSION: No evidence of acute cardiopulmonary disease. Electronically Signed   By: Maurine Simmering M.D.   On: 07/15/2021 11:14   ? ? ? ? ? ?Scheduled Meds: ? apixaban  5 mg Oral BID  ? aspirin  325 mg Oral Daily  ? clopidogrel  75 mg Oral Daily  ? empagliflozin  25 mg Oral Daily  ? ezetimibe  10 mg Oral Daily  ? insulin aspart  0-15 Units Subcutaneous TID WC  ? insulin glargine-yfgn  10 Units Subcutaneous QHS  ? isosorbide mononitrate  120 mg Oral Daily  ? metoprolol tartrate  100 mg Oral BID  ? pantoprazole  20 mg Oral BID  ? rosuvastatin  40 mg Oral Daily  ? sacubitril-valsartan  1 tablet Oral BID  ? sertraline  100 mg Oral Daily  ? ?Continuous Infusions: ? ? LOS: 0 days  ? ? ?Time spent: 33 mins  ? ? ? ?Wyvonnia Dusky, MD ?[facility] ?Pager [phone] ? ?If 7PM-7AM, please contact night-coverage ?07/16/2021, 7:54 AM  ? ?

## 2021-07-16 NOTE — Progress Notes (Signed)
*  PRELIMINARY RESULTS* ?Echocardiogram ?2D Echocardiogram has been performed. ? ?Lexxi Koslow, Sonia Side ?07/16/2021, 7:55 AM ?

## 2021-07-16 NOTE — Progress Notes (Signed)
[facility] Cardiology ? ? ? ?SUBJECTIVE: Patient still has atypical chest pain symptoms mild shortness of breath.  Appears to be reasonably stable ? ? ?Vitals:  ? 07/15/21 2018 07/15/21 2346 07/16/21 0317 07/16/21 0810  ?BP: 116/62 124/64 (!) 103/57 112/62  ?Pulse: 67 74 76 75  ?Resp: '20 20 16 19  '$ ?Temp: 98.2 ?F (36.8 ?C) 97.7 ?F (36.5 ?C) 97.9 ?F (36.6 ?C) 98.2 ?F (36.8 ?C)  ?TempSrc:      ?SpO2: 95% 96% 94% 94%  ?Weight:      ?Height:      ? ? ? ?Intake/Output Summary (Last 24 hours) at 07/16/2021 1001 ?Last data filed at 07/15/2021 2100 ?Gross per 24 hour  ?Intake 120 ml  ?Output --  ?Net 120 ml  ? ? ? ? ?PHYSICAL EXAM ? ?General: Well developed, well nourished, in no acute distress ?HEENT:  Normocephalic and atramatic ?Neck:  No JVD.  ?Lungs: Clear bilaterally to auscultation and percussion. ?Heart: HRRR . Normal S1 and S2 without gallops or murmurs.  ?Abdomen: Bowel sounds are positive, abdomen soft and non-tender  ?Msk:  Back normal, normal gait. Normal strength and tone for age. ?Extremities: No clubbing, cyanosis or edema.   ?Neuro: Alert and oriented X 3. ?Psych:  Good affect, responds appropriately ? ? ?LABS: ?Basic Metabolic Panel: ?Recent Labs  ?  07/15/21 ?1128  ?NA 145  ?K 4.3  ?CL 111  ?CO2 28  ?GLUCOSE 126*  ?BUN 10  ?CREATININE 0.80  ?CALCIUM 8.9  ? ?Liver Function Tests: ?Recent Labs  ?  07/15/21 ?1128  ?AST 49*  ?ALT 38  ?ALKPHOS 88  ?BILITOT 0.5  ?PROT 6.5  ?ALBUMIN 3.5  ? ?Recent Labs  ?  07/15/21 ?1128  ?LIPASE 35  ? ?CBC: ?Recent Labs  ?  07/15/21 ?1128  ?WBC 6.8  ?HGB 12.2  ?HCT 37.5  ?MCV 96.2  ?PLT 179  ? ?Cardiac Enzymes: ?No results for input(s): CKTOTAL, CKMB, CKMBINDEX, TROPONINI in the last 72 hours. ?BNP: ?Invalid input(s): POCBNP ?D-Dimer: ?Recent Labs  ?  07/15/21 ?1438  ?DDIMER 0.29  ? ?Hemoglobin A1C: ?No results for input(s): HGBA1C in the last 72 hours. ?Fasting Lipid Panel: ?No results for input(s): CHOL, HDL, LDLCALC, TRIG, CHOLHDL, LDLDIRECT in the last 72 hours. ?Thyroid Function  Tests: ?No results for input(s): TSH, T4TOTAL, T3FREE, THYROIDAB in the last 72 hours. ? ?Invalid input(s): FREET3 ?Anemia Panel: ?No results for input(s): VITAMINB12, FOLATE, FERRITIN, TIBC, IRON, RETICCTPCT in the last 72 hours. ? ?DG Chest 2 View ? ?Result Date: 07/15/2021 ?CLINICAL DATA:  Chest pain EXAM: CHEST - 2 VIEW COMPARISON:  Radiograph 06/10/2021, chest CT 04/11/2020 FINDINGS: Unchanged cardiomediastinal silhouette with prior median sternotomy CABG. There is no focal airspace consolidation. There is unchanged reticular opacities in the right peripheral upper lung related to peripheral scarring seen on prior chest CT. There is no large pleural effusion. There is no visible pneumothorax. There is bilateral shoulder osteoarthritis. There is no acute osseous abnormality. Thoracic spondylosis. IMPRESSION: No evidence of acute cardiopulmonary disease. Electronically Signed   By: Maurine Simmering M.D.   On: 07/15/2021 11:14   ? ? ?Echo preserved left ventricular function ? ?TELEMETRY: Normal sinus rhythm left bundle branch block rate of 80: ? ?ASSESSMENT AND PLAN: ? ?Principal Problem: ?  Chest pain ?Multivessel coronary disease ?History of coronary bypass surgery ?History of coronary bypass surgery ?Diabetes type 2 ?Atrial fibrillation paroxysmal ?Obesity ?COPD ?Hyperlipidemia ?[place] Kitchen ?Plan ?Patient doing reasonably well atypical chest pain ?Myoview unremarkable for ischemia ?Echocardiogram suggest preserved  left ventricular function ?Recommend conservative medical therapy ?Inhalers as necessary for COPD ?Diabetes type 2 recommend continued diabetes medical therapy ?Anticoagulation is necessary for atrial fibrillation paroxysmal ?Mild obesity recommend weight loss exercise portion control ?Statin therapy for hyperlipidemia ?Have the patient follow-up with cardiology as an outpatient ? ? ? ? ?Yolonda Kida, MD, ?07/16/2021 ?10:01 AM ? ? ? ?  ?

## 2021-07-17 DIAGNOSIS — R079 Chest pain, unspecified: Secondary | ICD-10-CM | POA: Diagnosis not present

## 2021-07-17 DIAGNOSIS — E1169 Type 2 diabetes mellitus with other specified complication: Secondary | ICD-10-CM | POA: Diagnosis not present

## 2021-07-17 DIAGNOSIS — I480 Paroxysmal atrial fibrillation: Secondary | ICD-10-CM | POA: Diagnosis not present

## 2021-07-17 LAB — BASIC METABOLIC PANEL
Anion gap: 5 (ref 5–15)
BUN: 16 mg/dL (ref 8–23)
CO2: 29 mmol/L (ref 22–32)
Calcium: 8.7 mg/dL — ABNORMAL LOW (ref 8.9–10.3)
Chloride: 109 mmol/L (ref 98–111)
Creatinine, Ser: 0.8 mg/dL (ref 0.44–1.00)
GFR, Estimated: >60 mL/min (ref >60)
Glucose, Bld: 146 mg/dL — ABNORMAL HIGH (ref 70–99)
Potassium: 3.9 mmol/L (ref 3.5–5.1)
Sodium: 143 mmol/L (ref 135–145)

## 2021-07-17 LAB — GLUCOSE, CAPILLARY
Glucose-Capillary: 140 mg/dL — ABNORMAL HIGH (ref 70–99)
Glucose-Capillary: 216 mg/dL — ABNORMAL HIGH (ref 70–99)

## 2021-07-17 LAB — CBC
HCT: 35 % — ABNORMAL LOW (ref 36.0–46.0)
Hemoglobin: 11.5 g/dL — ABNORMAL LOW (ref 12.0–15.0)
MCH: 31.4 pg (ref 26.0–34.0)
MCHC: 32.9 g/dL (ref 30.0–36.0)
MCV: 95.6 fL (ref 80.0–100.0)
Platelets: 160 10*3/uL (ref 150–400)
RBC: 3.66 MIL/uL — ABNORMAL LOW (ref 3.87–5.11)
RDW: 12.2 % (ref 11.5–15.5)
WBC: 6.1 10*3/uL (ref 4.0–10.5)
nRBC: 0 % (ref 0.0–0.2)

## 2021-07-17 MED ORDER — FAMOTIDINE 20 MG PO TABS
40.0000 mg | ORAL_TABLET | Freq: Every day | ORAL | Status: DC
  Administered 2021-07-17: 40 mg via ORAL
  Filled 2021-07-17: qty 2

## 2021-07-17 MED ORDER — ALUM & MAG HYDROXIDE-SIMETH 200-200-20 MG/5ML PO SUSP
30.0000 mL | ORAL | Status: DC | PRN
Start: 2021-07-17 — End: 2021-07-17
  Administered 2021-07-17: 30 mL via ORAL
  Filled 2021-07-17: qty 30

## 2021-07-17 NOTE — Discharge Summary (Signed)
Physician Discharge Summary  ?Candace [name] DOB: [birth year] DOA: 07/15/2021 ? ?PCP: [name]., MD ? ?Admit date: 07/15/2021 ?Discharge date: 07/17/2021 ? ?Admitted From: home  ?Disposition:  home ? ?Recommendations for Outpatient Follow-up:  ?Follow up with PCP in 1-2 weeks ?F/u w/ cardio in 1-2 days  ? ?Home Health: ?Equipment/Devices: ? ?Discharge Condition: stable  ?CODE STATUS: full  ?Diet recommendation: Heart Healthy / Carb Modified ?Brief/Interim Summary: ?HPI was taken from Dr. Jamse Arn: ?Candace [name] is an 71 y.o. female with CAD s/p CABG 2017, HFpEF, DM 2, PAF with multiple admissions for chest pain presents with chest pain.  Patient states that she has chronic intermittent chest pain for a long time.  Notes that her baseline chest pain is both right and left-sided and is not associated with exertion or shortness of breath.  Patient notes that over the past 2 to 3 days, she has had a squeezing sensation that is substernal that is associated with dyspnea on exertion.  Patient and daughter both note that she has had intermittent fatigue and weakness over the past 1 to 2 years which responds to fluids.  Patient was seen in advanced heart failure clinic earlier today and was told to come to the ED for possible "dehydration".  Patient denies fevers or chills, no new cough, no nausea or vomiting, no change in p.o. intake.  Patient does admit to eating [place] food on a reasonably regular basis and notes that "she gets weak and tired" after that.  Patient denies chest pain at rest or with exertion. ?  ?ED Course:  The patient was noted to be normotensive and afebrile.  EKG was without any change.  Troponins were negative.  Patient was discussed with Dr. Towanda Malkin who recommended admission for observation. ? ?As per Dr. Jimmye Norman 4/20-4/21/23: ?Cardio was consulted as pt had chest pain w/ hx of CAD s/p CABG in 2017. Cardiac stress was done which was unremarkable for ischemia. Pt's  home dose of aspirin was d/c as per cardio. Pt did have intermittent chest pain after cardiac stress test.  ? ?Discharge Diagnoses:  ?Principal Problem: ?  Chest pain ? ? ?Chest pain: w/ hx of CAD & CHF.  Continue on metoprolol, eliquis, plavix, imdur, entresto & statin. S/p cardiac stress test was unremarkable for ischemia as per cardio. ? ?HLD: continue on statin, zetia ?  ?PAF: continue metoprolol, eliquis ?  ?DM2: likely poorly controlled, HbA1c is pending. Continue on glargine, SSI w/ accuchecks ?  ?Depression: severity unknown. Continue on home dose of sertraline  ?  ?GERD: continue on PPI ? ?Discharge Instructions ? ?Discharge Instructions   ? ? Diet - low sodium heart healthy   Complete by: As directed ?  ? Discharge instructions   Complete by: As directed ?  ? F/u w/ PCP in 1-2 weeks. F/u cardio in 1-2 days.  ? Increase activity slowly   Complete by: As directed ?  ? ?  ? ?Allergies as of 07/17/2021   ? ?   Reactions  ? Lisinopril Cough  ? Penicillins Swelling, Rash, Other (See Comments)  ? Did it involve swelling of the face/tongue/throat, SOB, or low BP? Yes ?Did it involve sudden or severe rash/hives, skin peeling, or any reaction on the inside of your mouth or nose? Yes ?Did you need to seek medical attention at a hospital or doctor's office? Yes ?When did it last happen?      15 years ?If all above answers are ?NO?, may  proceed with cephalosporin use.  ? ?  ? ?  ?Medication List  ?  ? ?TAKE these medications   ? ?acetaminophen 325 MG tablet ?Commonly known as: TYLENOL ?Take 650 mg by mouth every 6 (six) hours as needed for moderate pain or headache. ?What changed: Another medication with the same name was removed. Continue taking this medication, and follow the directions you see here. ?  ?albuterol 108 (90 Base) MCG/ACT inhaler ?Commonly known as: VENTOLIN HFA ?Inhale 2 puffs into the lungs every 6 (six) hours as needed for wheezing or shortness of breath. ?  ?bismuth subsalicylate 008 MG chewable  tablet ?Commonly known as: PEPTO BISMOL ?Chew 524 mg by mouth daily as needed for diarrhea or loose stools or indigestion. ?  ?clopidogrel 75 MG tablet ?Commonly known as: PLAVIX ?Take 1 tablet (75 mg total) by mouth daily. ?  ?Eliquis 5 MG Tabs tablet ?Generic drug: apixaban ?Take 5 mg by mouth 2 (two) times daily. ?  ?empagliflozin 25 MG Tabs tablet ?Commonly known as: Jardiance ?Take 1 tablet (25 mg total) by mouth daily. ?  ?ezetimibe 10 MG tablet ?Commonly known as: ZETIA ?Take 1 tablet (10 mg total) by mouth daily. ?  ?furosemide 20 MG tablet ?Commonly known as: LASIX ?Take 20 mg by mouth daily. ?  ?glucose blood test strip ?Use to check blood sugars daily as instructed ?  ?Insulin Pen Needle 32G X 4 MM Misc ?Use to inject insulin daily ?  ?isosorbide mononitrate 120 MG 24 hr tablet ?Commonly known as: IMDUR ?Take 1 tablet (120 mg total) by mouth daily. ?  ?Lantus SoloStar 100 UNIT/ML Solostar Pen ?Generic drug: insulin glargine ?Inject 18 Units into the skin at bedtime. ?  ?loperamide 2 MG capsule ?Commonly known as: IMODIUM ?Take 2 mg by mouth daily as needed for diarrhea or loose stools. ?  ?metoprolol tartrate 100 MG tablet ?Commonly known as: LOPRESSOR ?Take 100 mg by mouth 2 (two) times daily. ?  ?nitroGLYCERIN 0.4 MG SL tablet ?Commonly known as: NITROSTAT ?Place 1 tablet (0.4 mg total) under the tongue every 5 (five) minutes as needed for chest pain. ?  ?nystatin powder ?Commonly known as: nystatin ?Apply 1 application topically daily. ?  ?ondansetron 8 MG disintegrating tablet ?Commonly known as: ZOFRAN-ODT ?Take 1 tablet (8 mg total) by mouth every 8 (eight) hours as needed for refractory nausea / vomiting. ?  ?OneTouch Delica Plus [id number] Misc ?USE UP TO 4 TIMES DAILY AS DIRECTED ?  ?OneTouch Verio w/Device Kit ?Use daily to check blood sugar ?  ?pantoprazole 20 MG tablet ?Commonly known as: Protonix ?Take 1 tablet (20 mg total) by mouth 2 (two) times daily. ?  ?rosuvastatin 40 MG tablet ?Commonly  known as: CRESTOR ?Take 1 tablet (40 mg total) by mouth daily. ?  ?sacubitril-valsartan 24-26 MG ?Commonly known as: ENTRESTO ?Take 1 tablet by mouth 2 (two) times daily. ?  ?sertraline 100 MG tablet ?Commonly known as: ZOLOFT ?Take 1 tablet (100 mg total) by mouth daily. ?  ?Trulicity 1.5 OI/7.1IW Sopn ?Generic drug: Dulaglutide ?Inject 1.5 mg into the skin once a week. ?  ? ?  ? ? ?Allergies  ?Allergen Reactions  ? Lisinopril Cough  ? Penicillins Swelling, Rash and Other (See Comments)  ?  Did it involve swelling of the face/tongue/throat, SOB, or low BP? Yes ?Did it involve sudden or severe rash/hives, skin peeling, or any reaction on the inside of your mouth or nose? Yes ?Did you need to seek medical attention at a  hospital or doctor's office? Yes ?When did it last happen?      15 years ?If all above answers are ?NO?, may proceed with cephalosporin use. ?  ? ? ?Consultations: ?Cardio: Dr. Clayborn Bigness  ? ? ?Procedures/Studies: ?DG Chest 2 View ? ?Result Date: 07/15/2021 ?CLINICAL DATA:  Chest pain EXAM: CHEST - 2 VIEW COMPARISON:  Radiograph 06/10/2021, chest CT 04/11/2020 FINDINGS: Unchanged cardiomediastinal silhouette with prior median sternotomy CABG. There is no focal airspace consolidation. There is unchanged reticular opacities in the right peripheral upper lung related to peripheral scarring seen on prior chest CT. There is no large pleural effusion. There is no visible pneumothorax. There is bilateral shoulder osteoarthritis. There is no acute osseous abnormality. Thoracic spondylosis. IMPRESSION: No evidence of acute cardiopulmonary disease. Electronically Signed   By: Maurine Simmering M.D.   On: 07/15/2021 11:14  ? ?NM Myocar Multi W/Spect W/Wall Motion / EF ? ?Result Date: 07/16/2021 ?  The study is normal. The study is low risk.   No ST deviation was noted.   Left ventricular function is normal. End diastolic cavity size is normal. Conclusion Normal Lexiscan Myoview No evidence of ischemia Ejection fraction of  65% ? ?ECHOCARDIOGRAM COMPLETE ? ?Result Date: 07/16/2021 ?   ECHOCARDIOGRAM REPORT   Patient Name:   SHANDEL BUSIC Symonette Date of Exam: 07/16/2021 Medical Rec #:  [id number]       Height:       62.0 in Accession

## 2021-07-17 NOTE — Discharge Summary (Deleted)
OMIT  

## 2021-07-17 NOTE — Progress Notes (Signed)
[facility] Cardiology ? ? ? ?SUBJECTIVE: Patient states to be doing reasonably well no pain no palpitations or tachycardia feels reasonably well has ambulated in the hall feels well enough to be discharged home.  Denies any significant chest pain or shortness of breath ? ? ?Vitals:  ? 07/17/21 0742 07/17/21 0756 07/17/21 1112 07/17/21 1115  ?BP: 125/67 134/86 91/60 118/63  ?Pulse: 83 72 88   ?Resp:  17 18   ?Temp:  98.1 ?F (36.7 ?C) 98 ?F (36.7 ?C)   ?TempSrc:  Oral    ?SpO2: 97% 96% 95%   ?Weight:      ?Height:      ? ? ? ?Intake/Output Summary (Last 24 hours) at 07/17/2021 1149 ?Last data filed at 07/17/2021 1109 ?Gross per 24 hour  ?Intake 600 ml  ?Output 775 ml  ?Net -175 ml  ? ? ? ? ?PHYSICAL EXAM ? ?General: Well developed, well nourished, in no acute distress ?HEENT:  Normocephalic and atramatic ?Neck:  No JVD.  ?Lungs: Clear bilaterally to auscultation and percussion. ?Heart: HRRR . Normal S1 and S2 without gallops or murmurs.  ?Abdomen: Bowel sounds are positive, abdomen soft and non-tender  ?Msk:  Back normal, normal gait. Normal strength and tone for age. ?Extremities: No clubbing, cyanosis or edema.   ?Neuro: Alert and oriented X 3. ?Psych:  Good affect, responds appropriately ? ? ?LABS: ?Basic Metabolic Panel: ?Recent Labs  ?  07/15/21 ?1128 07/17/21 ?0320  ?NA 145 143  ?K 4.3 3.9  ?CL 111 109  ?CO2 28 29  ?GLUCOSE 126* 146*  ?BUN 10 16  ?CREATININE 0.80 0.80  ?CALCIUM 8.9 8.7*  ? ?Liver Function Tests: ?Recent Labs  ?  07/15/21 ?1128  ?AST 49*  ?ALT 38  ?ALKPHOS 88  ?BILITOT 0.5  ?PROT 6.5  ?ALBUMIN 3.5  ? ?Recent Labs  ?  07/15/21 ?1128  ?LIPASE 35  ? ?CBC: ?Recent Labs  ?  07/15/21 ?1128 07/17/21 ?0320  ?WBC 6.8 6.1  ?HGB 12.2 11.5*  ?HCT 37.5 35.0*  ?MCV 96.2 95.6  ?PLT 179 160  ? ?Cardiac Enzymes: ?No results for input(s): CKTOTAL, CKMB, CKMBINDEX, TROPONINI in the last 72 hours. ?BNP: ?Invalid input(s): POCBNP ?D-Dimer: ?Recent Labs  ?  07/15/21 ?1438  ?DDIMER 0.29  ? ?Hemoglobin A1C: ?No results for  input(s): HGBA1C in the last 72 hours. ?Fasting Lipid Panel: ?No results for input(s): CHOL, HDL, LDLCALC, TRIG, CHOLHDL, LDLDIRECT in the last 72 hours. ?Thyroid Function Tests: ?No results for input(s): TSH, T4TOTAL, T3FREE, THYROIDAB in the last 72 hours. ? ?Invalid input(s): FREET3 ?Anemia Panel: ?No results for input(s): VITAMINB12, FOLATE, FERRITIN, TIBC, IRON, RETICCTPCT in the last 72 hours. ? ?NM Myocar Multi W/Spect W/Wall Motion / EF ? ?Result Date: 07/16/2021 ?  The study is normal. The study is low risk.   No ST deviation was noted.   Left ventricular function is normal. End diastolic cavity size is normal. Conclusion Normal Lexiscan Myoview No evidence of ischemia Ejection fraction of 65% ? ?ECHOCARDIOGRAM COMPLETE ? ?Result Date: 07/16/2021 ?   ECHOCARDIOGRAM REPORT   Patient Name:   Candace [name] No Date of Exam: 07/16/2021 Medical Rec #:  [id number]       Height:       62.0 in Accession #:    [id number]      Weight:       173.1 lb Date of Birth:  1950-06-19       BSA:          1.798 m?  Patient Age:    71 years        BP:           103/57 mmHg Patient Gender: F               HR:           76 bpm. Exam Location:  [facility] Procedure: 2D Echo, Cardiac Doppler and Color Doppler Indications:     Acute ischemic heart disease-unspecified I24.9  History:         Patient has prior history of Echocardiogram examinations, most                  recent 06/18/2020. CHF, COPD, Arrythmias:Atrial Fibrillation;                  Risk Factors:Hypertension.  Sonographer:     Sherrie Sport Referring Phys:  [id number] Ajdin Macke D [name] Diagnosing Phys: Yolonda Kida MD  Sonographer Comments: Suboptimal apical window. IMPRESSIONS  1. Left ventricular ejection fraction, by estimation, is 55 to 60%. The left ventricle has normal function. The left ventricle has no regional wall motion abnormalities. Left ventricular diastolic parameters are consistent with Grade I diastolic dysfunction (impaired relaxation).  2. Right ventricular systolic  function is normal. The right ventricular size is normal.  3. The mitral valve is normal in structure. Trivial mitral valve regurgitation.  4. The aortic valve is normal in structure. Aortic valve regurgitation is not visualized. FINDINGS  Left Ventricle: Left ventricular ejection fraction, by estimation, is 55 to 60%. The left ventricle has normal function. The left ventricle has no regional wall motion abnormalities. The left ventricular internal cavity size was normal in size. There is  borderline eccentric left ventricular hypertrophy of the infero-lateral segment. Left ventricular diastolic parameters are consistent with Grade I diastolic dysfunction (impaired relaxation). Right Ventricle: The right ventricular size is normal. No increase in right ventricular wall thickness. Right ventricular systolic function is normal. Left Atrium: Left atrial size was normal in size. Right Atrium: Right atrial size was normal in size. Pericardium: There is no evidence of pericardial effusion. Mitral Valve: The mitral valve is normal in structure. Trivial mitral valve regurgitation. Tricuspid Valve: The tricuspid valve is normal in structure. Tricuspid valve regurgitation is not demonstrated. Aortic Valve: The aortic valve is normal in structure. Aortic valve regurgitation is not visualized. Aortic valve mean gradient measures 4.0 mmHg. Aortic valve peak gradient measures 8.3 mmHg. Aortic valve area, by VTI measures 2.88 cm?. Pulmonic Valve: The pulmonic valve was normal in structure. Pulmonic valve regurgitation is not visualized. Aorta: The ascending aorta was not well visualized. IAS/Shunts: No atrial level shunt detected by color flow Doppler.  LEFT VENTRICLE PLAX 2D LVIDd:         3.80 cm   Diastology LVIDs:         2.70 cm   LV e' medial:    3.92 cm/s LV PW:         1.40 cm   LV E/e' medial:  19.0 LV IVS:        0.90 cm   LV e' lateral:   3.92 cm/s LVOT diam:     2.00 cm   LV E/e' lateral: 19.0 LV SV:         59 LV SV  Index:   33 LVOT Area:     3.14 cm?  RIGHT VENTRICLE RV Basal diam:  3.30 cm RV S prime:     9.57 cm/s TAPSE (M-mode): 1.2 cm  LEFT ATRIUM             Index        RIGHT ATRIUM           Index LA diam:        3.60 cm 2.00 cm/m?   RA Area:     13.20 cm? LA Vol (A2C):   43.9 ml 24.42 ml/m?  RA Volume:   29.90 ml  16.63 ml/m? LA Vol (A4C):   32.7 ml 18.19 ml/m? LA Biplane Vol: 39.0 ml 21.69 ml/m?  AORTIC VALVE                    PULMONIC VALVE AV Area (Vmax):    1.82 cm?     PV Vmax:        0.63 m/s AV Area (Vmean):   1.94 cm?     PV Vmean:       40.700 cm/s AV Area (VTI):     2.88 cm?     PV VTI:         0.118 m AV Vmax:           144.00 cm/s  PV Peak grad:   1.6 mmHg AV Vmean:          89.700 cm/s  PV Mean grad:   1.0 mmHg AV VTI:            0.206 m      RVOT Peak grad: 3 mmHg AV Peak Grad:      8.3 mmHg AV Mean Grad:      4.0 mmHg LVOT Vmax:         83.60 cm/s LVOT Vmean:        55.500 cm/s LVOT VTI:          0.189 m LVOT/AV VTI ratio: 0.92  AORTA Ao Root diam: 2.90 cm MITRAL VALVE                TRICUSPID VALVE MV Area (PHT): 4.63 cm?     TR Peak grad:   23.6 mmHg MV Decel Time: 164 msec     TR Vmax:        243.00 cm/s MV E velocity: 74.60 cm/s MV A velocity: 110.00 cm/s  SHUNTS MV E/A ratio:  0.68         Systemic VTI:  0.19 m                             Systemic Diam: 2.00 cm                             Pulmonic VTI:  0.199 m Yolonda Kida MD Electronically signed by Yolonda Kida MD Signature Date/Time: 07/16/2021/12:51:03 PM    Final    ? ? ?Echo preserved left ventricular function EF around 55 to 15% diastolic dysfunction ? ?TELEMETRY: Normal sinus rhythm rate of 85 left bundle branch block: ? ?ASSESSMENT AND PLAN: ? ?Principal Problem: ?  Chest pain ?Multivessel coronary disease ?Hypertension ?Diabetes type 2 ?History of coronary bypass surgery ?Atrial fibrillation paroxysmal ?Mild obesity ?COPD ?Hyperlipidemia ?Negative Myoview 07/16/2021 ? ?Plan ?Recommend conservative medical therapy ?Resume Eliquis  for anticoagulation for atrial fibrillation ?Okay to maintain Plavix for arteriosclerotic vascular disease and coronary disease ?Recommend discontinuing aspirin so she is only on dual therapy ?Continue inhale

## 2021-07-20 ENCOUNTER — Ambulatory Visit (INDEPENDENT_AMBULATORY_CARE_PROVIDER_SITE_OTHER): Payer: 59

## 2021-07-20 ENCOUNTER — Telehealth: Payer: Self-pay

## 2021-07-20 ENCOUNTER — Other Ambulatory Visit: Payer: Self-pay | Admitting: *Deleted

## 2021-07-20 DIAGNOSIS — G8929 Other chronic pain: Secondary | ICD-10-CM

## 2021-07-20 DIAGNOSIS — I5032 Chronic diastolic (congestive) heart failure: Secondary | ICD-10-CM

## 2021-07-20 NOTE — Chronic Care Management (AMB) (Addendum)
Chronic Care Management   CCM RN Visit Note  07/20/2021 Name: Candace [name] MRN: [id number] DOB: 03/12/1951  Subjective: Candace [name] is a 71 y.o. year old female who is a primary care patient of [name]., MD. The care management team was consulted for assistance with disease management and care coordination needs.    Engaged with patient by telephone for follow up visit in response to provider referral for case management and care coordination services.   Consent to Services:  The patient was given information about Chronic Care Management services, agreed to services, and gave verbal consent prior to initiation of services.  Please see initial visit note for detailed documentation.   Assessment: Review of patient past medical history, allergies, medications, health status, including review of consultants reports, laboratory and other test data, was performed as part of comprehensive evaluation and provision of chronic care management services.   SDOH (Social Determinants of Health) assessments and interventions performed: No  CCM Care Plan  Allergies  Allergen Reactions   Lisinopril Cough   Penicillins Swelling, Rash and Other (See Comments)    Did it involve swelling of the face/tongue/throat, SOB, or low BP? Yes Did it involve sudden or severe rash/hives, skin peeling, or any reaction on the inside of your mouth or nose? Yes Did you need to seek medical attention at a hospital or doctor's office? Yes When did it last happen?      15 years If all above answers are "NO", may proceed with cephalosporin use.     Outpatient Encounter Medications as of 07/20/2021  Medication Sig Note   acetaminophen (TYLENOL) 325 MG tablet Take 650 mg by mouth every 6 (six) hours as needed for moderate pain or headache.     albuterol (VENTOLIN HFA) 108 (90 Base) MCG/ACT inhaler Inhale 2 puffs into the lungs every 6 (six) hours as needed for wheezing or shortness of breath. (Patient  not taking: Reported on 07/15/2021)    bismuth subsalicylate (PEPTO BISMOL) 262 MG chewable tablet Chew 524 mg by mouth daily as needed for diarrhea or loose stools or indigestion. (Patient not taking: Reported on 07/20/2021)    Blood Glucose Monitoring Suppl (ONETOUCH VERIO) w/Device KIT Use daily to check blood sugar    clopidogrel (PLAVIX) 75 MG tablet Take 1 tablet (75 mg total) by mouth daily.    Dulaglutide (TRULICITY) 1.5 FH/5.4TG SOPN Inject 1.5 mg into the skin once a week. 07/15/2021: Thurdays   ELIQUIS 5 MG TABS tablet Take 5 mg by mouth 2 (two) times daily. 06/06/2020: Prescribed by Dr. Nehemiah Massed   empagliflozin (JARDIANCE) 25 MG TABS tablet Take 1 tablet (25 mg total) by mouth daily.    ezetimibe (ZETIA) 10 MG tablet Take 1 tablet (10 mg total) by mouth daily.    furosemide (LASIX) 20 MG tablet Take 20 mg by mouth daily. (Patient not taking: Reported on 07/15/2021) 07/15/2021: Stopped taking today (07-15-2021)   glucose blood test strip Use to check blood sugars daily as instructed    insulin glargine (LANTUS SOLOSTAR) 100 UNIT/ML Solostar Pen Inject 18 Units into the skin at bedtime.    Insulin Pen Needle 32G X 4 MM MISC Use to inject insulin daily    isosorbide mononitrate (IMDUR) 120 MG 24 hr tablet Take 1 tablet (120 mg total) by mouth daily.    Lancets (ONETOUCH DELICA PLUS [id number]) MISC USE UP TO 4 TIMES DAILY AS DIRECTED    loperamide (IMODIUM) 2 MG capsule Take 2 mg by  mouth daily as needed for diarrhea or loose stools. 07/20/2021: PRN only    metoprolol tartrate (LOPRESSOR) 100 MG tablet Take 100 mg by mouth 2 (two) times daily. 06/06/2020: Prescribed by Dr. Nehemiah Massed    nitroGLYCERIN (NITROSTAT) 0.4 MG SL tablet Place 1 tablet (0.4 mg total) under the tongue every 5 (five) minutes as needed for chest pain.    nystatin (NYSTATIN) powder Apply 1 application topically daily.    ondansetron (ZOFRAN-ODT) 8 MG disintegrating tablet Take 1 tablet (8 mg total) by mouth every 8 (eight) hours  as needed for refractory nausea / vomiting. 07/20/2021: PRN only    pantoprazole (PROTONIX) 20 MG tablet Take 1 tablet (20 mg total) by mouth 2 (two) times daily.    rosuvastatin (CRESTOR) 40 MG tablet Take 1 tablet (40 mg total) by mouth daily.    sacubitril-valsartan (ENTRESTO) 24-26 MG Take 1 tablet by mouth 2 (two) times daily.    sertraline (ZOLOFT) 100 MG tablet Take 1 tablet (100 mg total) by mouth daily.    [DISCONTINUED] cetirizine (ZYRTEC) 5 MG tablet Take 2 tablets (10 mg total) by mouth daily.    No facility-administered encounter medications on file as of 07/20/2021.    Patient Active Problem List   Diagnosis Date Noted   Left hip pain 06/30/2021   Vertigo    Unsteady gait    Acute bronchitis 03/07/2019   Esophageal dysphagia    Stomach irritation    Gastric polyp    Esophageal lump    Hx of colonic polyp    Polyp of colon    Diverticulosis of large intestine without diverticulitis    PSVT (paroxysmal supraventricular tachycardia) (HCC) 10/03/2018   Abnormal ECG 07/05/2018   Tachycardia 03/27/2018   Pain in limb 02/28/2018   Chronic diastolic CHF (congestive heart failure) ([place]) 11/03/2017   TIA (transient ischemic attack) 11/03/2017   COPD suggested by initial evaluation ([place]) 08/23/2017   Acute and chronic respiratory failure with hypoxia ([place]) 08/23/2017   Postoperative state 08/15/2017   Incidental pulmonary nodule 08/01/2017   Non-ST elevation myocardial infarction (NSTEMI), subendocardial infarction, subsequent episode of care ([place]) 06/01/2017   B12 deficiency 12/01/2016   Vitamin D deficiency 11/04/2016   Depression with anxiety 09/30/2016   AF (paroxysmal atrial fibrillation) ([place]) 09/30/2016   Resting tremor 09/30/2016   Mild obstructive sleep apnea 09/30/2016   Headache 08/18/2016   Unstable angina ([place]) 08/03/2016   Diabetes mellitus ([place]) 08/03/2016   Chronic daily headache 05/05/2016   Asthma 11/11/2015   S/P CABG x 4 11/10/2015   Coronary artery  disease 11/07/2015   Carotid artery narrowing 02/08/2014   Chest pain 02/08/2014   Mixed hyperlipidemia 02/08/2014   Bilateral carotid artery stenosis 02/08/2014   Atypical chest pain 02/07/2014   Essential hypertension 02/07/2014   Awareness of heartbeats 02/07/2014   D (diarrhea) 10/05/2013   Gastroesophageal reflux disease 10/05/2013    Hypertension Interventions: Reviewed plan for hypertension management. Continues taking medications as prescribed. Reviewed BP parameters. Strongly advised to monitor d/t recent hospitalization r/t unspecified chest pain. Reviewed indications for notifying a provider.  Reviewed complications of uncontrolled blood pressure.  Reviewed s/sx of heart attack, stroke and worsening symptoms that require immediate medical attention.   Diabetes Interventions:  Reviewed plan for diabetes management. Reports having needed prescriptions and taking medications as prescribed. Reviewed blood glucose readings. Reports monitoring as advised. Denies hypoglycemic or hyperglycemic episodes.  Reviewed nutritional intake. She is currently with her daughter d/t recent hospitalization. Reports daughter is assisting with  meals and attempting to monitor amount of carbs in meals and snacks. Reports appetite has decreased since hospital discharge. She is able to tolerate light meals and yogurt without a problem. Discussed importance of consistently monitoring blood glucose levels to avoid lows d/t decreased intake.   Chest Tightness Interventions: Patient reports episodes of chest tightness over the past week. Denied crushing or radiating chest pain. Denies shortness of breath. Denies changes in activity. Describes symptoms as "quick tightening" that resolves quickly. She has not taken nitroglycerin but agreed to administer if the symptoms return.  Thorough discussion regarding symptoms that require immediate medical attention. Discussed high risk d/t significant cardiac history.  Advised to call 911 or have a family member transport her to the nearest Emergency Room if the symptoms worsen. Update 06/11/2021: Reports completing evaluation for recurring chest discomfort. Reports taking nitroglycerin as advised and feeling much better today. Denies episodes of chest discomfort today. Denies palpitations or shortness of breath. Reports completing labs and chest x-ray as prescribed on 06/10/2021.  Discussed plan r/t completing tasks at home. Has very good family support.  Reports currently staying with her daughter until symptoms resolve.  Thorough discussion regarding worsening symptoms and indications for seeking immediate medical attention.  Update 07/20/21: Patient required hospitalization from 4/19-4/21 due to chest pain. Reviewed discharge instructions. Reports taking all medications as prescribed and following activity restrictions. Reports speaking with the Cardiology team related to possible need for additional imaging.  Discussed current symptoms. Reports continued intermittent chest pain. Reports the pain is not triggered by activity and seems occur randomly throughout the day. Describes it as "brief squeezing" sensations. Reports unresolved fatigue that was present prior to hospitalization. Reports experiencing nausea during the hospitalization. Reports the nausea has resolved. Denies lightheadedness or dizziness. Denies shortness of breath at rest but notes that her activity tolerance has declined. Recalls needing to take frequent breaks while performing tasks in the home. Confirmed that she will be staying with her daughter until she has fully recovered.  Advised to follow up as scheduled for her PCP appointment next week. Thorough review of worsening symptoms that require immediate medical follow up.    PLAN: Member will follow up in the clinic next week.  [name] [phone]

## 2021-07-20 NOTE — Telephone Encounter (Signed)
Transition Care Management Follow-up Telephone Call ?Date of discharge and from where:  07-17-21 Dx: Chest pain  ?How have you been since you were released from the hospital? About the same  ?Any questions or concerns? Still having chest pain- pain 7/10- nitro eases the pain but pain comes back  ? ?Items Reviewed: ?Did the pt receive and understand the discharge instructions provided? Yes  ?Medications obtained and verified? Yes  ?Other? No  ?Any new allergies since your discharge? No  ?Dietary orders reviewed? Yes ?Do you have support at home? Yes  ? ?Home Care and Equipment/Supplies: ?Were home health services ordered? no ?If so, what is the name of the agency? na  ?Has the agency set up a time to come to the patient's home? not applicable ?Were any new equipment or medical supplies ordered?  No ?What is the name of the medical supply agency? na ?Were you able to get the supplies/equipment? not applicable ?Do you have any questions related to the use of the equipment or supplies? No ? ?Functional Questionnaire: (I = Independent and D = Dependent) ?ADLs: I ? ?Bathing/Dressing- I ? ?Meal Prep- I ? ?Eating- I ? ?Maintaining continence- I ? ?Transferring/Ambulation- I ? ?Managing Meds- I ? ?Follow up appointments reviewed: ? ?PCP Hospital f/u appt confirmed? Yes  Scheduled to see Dr Rosanna Randy on 07-30-21 @ 140pm. ?[facility] f/u appt confirmed? No .- pt plans to schedule fu appt with cardiology ?Are transportation arrangements needed? No  ?If their condition worsens, is the pt aware to call PCP or go to the Emergency Dept.? Yes ?Was the patient provided with contact information for the PCP's office or ED? Yes ?Was to pt encouraged to call back with questions or concerns? Yes  ?

## 2021-07-24 ENCOUNTER — Telehealth: Payer: Self-pay

## 2021-07-24 ENCOUNTER — Other Ambulatory Visit: Payer: Self-pay | Admitting: Family

## 2021-07-24 NOTE — Progress Notes (Signed)
Chronic Care Management APPOINTMENT REMINDER ? ? ?Called Candace [name], No answer, left message of appointment on 07/27/2021 at 8:30 am via telephone visit with Junius Argyle , Pharm D. Notified to have all medications, supplements, blood pressure and/or blood sugar logs available during appointment and to return call if need to reschedule. ? ? ? ? ?Bessie Kellihan,CPA ?Clinical Pharmacist Assistant ?[phone]  ? ?

## 2021-07-26 DIAGNOSIS — I5032 Chronic diastolic (congestive) heart failure: Secondary | ICD-10-CM | POA: Diagnosis not present

## 2021-07-27 ENCOUNTER — Ambulatory Visit: Payer: 59

## 2021-07-27 DIAGNOSIS — E1142 Type 2 diabetes mellitus with diabetic polyneuropathy: Secondary | ICD-10-CM

## 2021-07-27 DIAGNOSIS — I5032 Chronic diastolic (congestive) heart failure: Secondary | ICD-10-CM

## 2021-07-27 NOTE — Progress Notes (Signed)
Chronic Care Management Pharmacy Note  07/27/2021 Name:  Candace [name] MRN:  [id number] DOB:  [birth year]  Summary: Patient presents for CCM follow-up.  -Higher dose of Imdur has not made much difference. Chest pain improved somewhat. Went shopping for the first time this weekend, significant shortness of breath.   Recommendations/Changes made from today's visit: -Continue current medications  -Recheck A1c, fasting lipid panel at PCP follow-up.  -Given her low GAD-7 score, would not recommend increasing sertraline at this time. Recommend work-up by PCP to evaluate patient for tremor and chest pain.   Plan: CPP Follow-up in 1 month   Subjective: Candace [name] is an 71 y.o. year old female who is a primary patient of [name]., MD.  The CCM team was consulted for assistance with disease management and care coordination needs.    Engaged with patient by telephone for follow up visit in response to provider referral for pharmacy case management and/or care coordination services.   Consent to Services:  The patient was given information about Chronic Care Management services, agreed to services, and gave verbal consent prior to initiation of services.  Please see initial visit note for detailed documentation.   Patient Care Team: [name]., MD as PCP - General (Family Medicine) Corey Skains, MD as Consulting Physician (Cardiology) Sharlotte Alamo, DPM (Podiatry) Neldon Labella, RN as Case Manager Virgel Manifold, MD (Inactive) as Consulting Physician (Gastroenterology) Pa, [place], Glean Salvo, [facility] as Pharmacist (Pharmacist)  Recent office visits: 06/30/21: Patient presented to Mikey Kirschner, PA-C for hip pain. Tramadol.  01/07/21: Patient presented to Dr. Rosanna Randy for ear pain and cough. Albuterol, doxycycline 100 mg twice daily.  12/02/20: Patient presented to Dr. Rosanna Randy for follow-up. Sertraline 100 mg.   Recent consult  visits: 07/15/21: Patient presented to Darylene Price, [place] for follow-up.  03/12/21: Patient presented to Dr. Nehemiah Massed (Cardiology) for follow-up. No medication changes noted.   Hospital visits: 07/15/21-07/17/21: Patient hospitalized for chest pain.  02/08/21: Patient presented to ED for chest pain.   Objective:  Lab Results  Component Value Date   CREATININE 0.80 07/17/2021   BUN 16 07/17/2021   GFRNONAA >60 07/17/2021   GFRAA 108 02/06/2020   NA 143 07/17/2021   K 3.9 07/17/2021   CALCIUM 8.7 (L) 07/17/2021   CO2 29 07/17/2021    Lab Results  Component Value Date/Time   HGBA1C 8.5 (H) 03/26/2021 09:07 AM   HGBA1C 10.7 (H) 12/02/2020 04:06 PM   HGBA1C 9.2 07/10/2019 12:00 AM   HGBA1C 8.3 (H) 07/14/2013 05:42 AM   MICROALBUR 50 08/11/2020 08:51 AM    Last diabetic Eye exam:  Lab Results  Component Value Date/Time   HMDIABEYEEXA No Retinopathy 06/28/2019 12:00 AM    Last diabetic Foot exam: No results found for: HMDIABFOOTEX   Lab Results  Component Value Date   CHOL 164 08/11/2020   HDL 57 08/11/2020   LDLCALC 90 08/11/2020   TRIG 94 08/11/2020   CHOLHDL 2.9 08/11/2020       Latest Ref Rng & Units 07/15/2021   11:28 AM 06/10/2021    4:03 PM 02/09/2021    6:31 AM  Hepatic Function  Total Protein 6.5 - 8.1 g/dL 6.5   6.7   6.1    Albumin 3.5 - 5.0 g/dL 3.5   4.4   3.4    AST 15 - 41 U/L 49   23   16    ALT 0 - 44  U/L 38   19   11    Alk Phosphatase 38 - 126 U/L 88   146   76    Total Bilirubin 0.3 - 1.2 mg/dL 0.5   0.2   0.4    Bilirubin, Direct 0.0 - 0.2 mg/dL <0.1        Lab Results  Component Value Date/Time   TSH 1.320 06/10/2021 04:03 PM   TSH 1.180 01/03/2019 09:57 AM       Latest Ref Rng & Units 07/17/2021    3:20 AM 07/15/2021   11:28 AM 06/10/2021    4:03 PM  CBC  WBC 4.0 - 10.5 K/uL 6.1   6.8   6.7    Hemoglobin 12.0 - 15.0 g/dL 11.5   12.2   13.4    Hematocrit 36.0 - 46.0 % 35.0   37.5   42.0    Platelets 150 - 400 K/uL 160   179   201       Lab Results  Component Value Date/Time   VD25OH 12.2 (L) 10/01/2016 09:35 AM    Clinical ASCVD: Yes  The ASCVD Risk score (Arnett DK, et al., 2019) failed to calculate for the following reasons:   The patient has a prior MI or stroke diagnosis       07/15/2021    9:13 AM 06/30/2021    1:10 PM 10/29/2020    8:49 AM  Depression screen PHQ 2/9  Decreased Interest 0 0 0  Down, Depressed, Hopeless 0 0 0  PHQ - 2 Score 0 0 0  Altered sleeping  1 1  Tired, decreased energy  1 1  Change in appetite  1 0  Feeling bad or failure about yourself   0 0  Trouble concentrating  0 0  Moving slowly or fidgety/restless  0 1  Suicidal thoughts  0 0  PHQ-9 Score  3 3  Difficult doing work/chores  Not difficult at all Not difficult at all    Social History   Tobacco Use  Smoking Status Former   Packs/day: 0.50   Types: Cigarettes   Quit date: 10/07/2001   Years since quitting: 19.8  Smokeless Tobacco Never   BP Readings from Last 3 Encounters:  07/17/21 118/63  07/15/21 112/68  06/30/21 93/70   Pulse Readings from Last 3 Encounters:  07/17/21 88  07/15/21 82  06/30/21 88   Wt Readings from Last 3 Encounters:  07/15/21 173 lb 1 oz (78.5 kg)  07/15/21 173 lb 4 oz (78.6 kg)  06/30/21 169 lb 4.8 oz (76.8 kg)    Assessment/Interventions: Review of patient past medical history, allergies, medications, health status, including review of consultants reports, laboratory and other test data, was performed as part of comprehensive evaluation and provision of chronic care management services.   SDOH:  (Social Determinants of Health) assessments and interventions performed: Yes    CCM Care Plan  Allergies  Allergen Reactions   Lisinopril Cough   Penicillins Swelling, Rash and Other (See Comments)    Did it involve swelling of the face/tongue/throat, SOB, or low BP? Yes Did it involve sudden or severe rash/hives, skin peeling, or any reaction on the inside of your mouth or nose?  Yes Did you need to seek medical attention at a hospital or doctor's office? Yes When did it last happen?      15 years If all above answers are "NO", may proceed with cephalosporin use.     Medications Reviewed Today  Reviewed by Vennie Homans, LPN (Licensed Practical Nurse) on 07/20/21 at 1241  Med List Status: <None>   Medication Order Taking? Sig Documenting Provider Last Dose Status Informant  acetaminophen (TYLENOL) 325 MG tablet [id number] Yes Take 650 mg by mouth every 6 (six) hours as needed for moderate pain or headache.  [provider] Taking Active Self  albuterol (VENTOLIN HFA) 108 (90 Base) MCG/ACT inhaler [id number] No Inhale 2 puffs into the lungs every 6 (six) hours as needed for wheezing or shortness of breath.  Patient not taking: Reported on 07/15/2021   [name]., MD Not Taking Active Self  bismuth subsalicylate (PEPTO BISMOL) 262 MG chewable tablet [id number] No Chew 524 mg by mouth daily as needed for diarrhea or loose stools or indigestion.  Patient not taking: Reported on 07/20/2021   [provider] Not Taking Active Self  Blood Glucose Monitoring Suppl [facility] VERIO) w/Device KIT [id number] Yes Use daily to check blood sugar Brita Romp Dionne Bucy, MD Taking Active Self    Discontinued 03/06/19 1830   clopidogrel (PLAVIX) 75 MG tablet [id number] Yes Take 1 tablet (75 mg total) by mouth daily. [name]., MD Taking Active Self  Dulaglutide (TRULICITY) 1.5 DZ/3.2DJ [facility] [id number] Yes Inject 1.5 mg into the skin once a week. Mikey Kirschner, PA-C Taking Active Self           Med Note [facility], RAQUEL   Wed Jul 15, 2021  9:26 AM) Thurdays  ELIQUIS 5 MG TABS tablet [id number] Yes Take 5 mg by mouth 2 (two) times daily. [provider] Taking Active Self           Med Note Michaelle Birks, Glean Salvo   Fri Jun 06, 2020 11:56 AM) Prescribed by Dr. Nehemiah Massed  empagliflozin (JARDIANCE) 25 MG TABS tablet [id number] Yes  Take 1 tablet (25 mg total) by mouth daily. Mikey Kirschner, PA-C Taking Active Self  ezetimibe (ZETIA) 10 MG tablet [id number] Yes Take 1 tablet (10 mg total) by mouth daily. [name]., MD Taking Active Self  furosemide (LASIX) 20 MG tablet [id number] No Take 20 mg by mouth daily.  Patient not taking: Reported on 07/15/2021   [provider] Not Taking Active Self           Med Note Nicole Kindred, GARY M   Wed Jul 15, 2021  3:18 PM) Stopped taking today (07-15-2021)  glucose blood test strip [id number] Yes Use to check blood sugars daily as instructed [name]., MD Taking Active Self  insulin glargine (LANTUS SOLOSTAR) 100 UNIT/ML Solostar Pen [id number] Yes Inject 18 Units into the skin at bedtime. Mikey Kirschner, PA-C Taking Active Self  Insulin Pen Needle 32G X 4 MM MISC [id number] Yes Use to inject insulin daily [name]., MD Taking Active Self  isosorbide mononitrate (IMDUR) 120 MG 24 hr tablet [id number] Yes Take 1 tablet (120 mg total) by mouth daily. [name]., MD Taking Active Self  Lancets (ONETOUCH DELICA PLUS [id number]) [place] [id number] Yes USE UP TO 4 TIMES DAILY AS DIRECTED [name]., MD Taking Active Self  loperamide (IMODIUM) 2 MG capsule [id number] Yes Take 2 mg by mouth daily as needed for diarrhea or loose stools. [provider] Taking Active Self           Med Note Patrick North, REBECCA H   Mon Jul 20, 2021 12:40 PM) PRN only   metoprolol tartrate (LOPRESSOR) 100 MG tablet [id number] Yes Take 100  mg by mouth 2 (two) times daily. [provider] Taking Active Self           Med Note Michaelle Birks, Glean Salvo   Fri Jun 06, 2020 11:56 AM) Prescribed by Dr. Nehemiah Massed   nitroGLYCERIN (NITROSTAT) 0.4 MG SL tablet [id number] Yes Place 1 tablet (0.4 mg total) under the tongue every 5 (five) minutes as needed for chest pain. Bettey Costa, MD Taking Active Self           Med Note Johny Drilling Mar 27, 2018  2:39  PM)    nystatin (NYSTATIN) powder [id number] Yes Apply 1 application topically daily. Rubie Maid, MD Taking Active Self  ondansetron (ZOFRAN-ODT) 8 MG disintegrating tablet [id number] Yes Take 1 tablet (8 mg total) by mouth every 8 (eight) hours as needed for refractory nausea / vomiting. Mikey Kirschner, PA-C Taking Active Self           Med Note Patrick North, REBECCA H   Mon Jul 20, 2021 12:40 PM) PRN only   pantoprazole (PROTONIX) 20 MG tablet [id number] Yes Take 1 tablet (20 mg total) by mouth 2 (two) times daily. [name]., MD Taking Active Self  rosuvastatin (CRESTOR) 40 MG tablet [id number] Yes Take 1 tablet (40 mg total) by mouth daily. [name]., MD Taking Active Self  sacubitril-valsartan [facility]) 24-26 [place] [id number] Yes Take 1 tablet by mouth 2 (two) times daily. Alisa Graff, FNP Taking Active Self  sertraline (ZOLOFT) 100 MG tablet [id number] Yes Take 1 tablet (100 mg total) by mouth daily. [name]., MD Taking Active Self            Patient Active Problem List   Diagnosis Date Noted   Left hip pain 06/30/2021   Vertigo    Unsteady gait    Acute bronchitis 03/07/2019   Esophageal dysphagia    Stomach irritation    Gastric polyp    Esophageal lump    Hx of colonic polyp    Polyp of colon    Diverticulosis of large intestine without diverticulitis    PSVT (paroxysmal supraventricular tachycardia) (HCC) 10/03/2018   Abnormal ECG 07/05/2018   Tachycardia 03/27/2018   Pain in limb 02/28/2018   Chronic diastolic CHF (congestive heart failure) ([place]) 11/03/2017   TIA (transient ischemic attack) 11/03/2017   COPD suggested by initial evaluation ([place]) 08/23/2017   Acute and chronic respiratory failure with hypoxia ([place]) 08/23/2017   Postoperative state 08/15/2017   Incidental pulmonary nodule 08/01/2017   Non-ST elevation myocardial infarction (NSTEMI), subendocardial infarction, subsequent episode of care ([place]) 06/01/2017   B12  deficiency 12/01/2016   Vitamin D deficiency 11/04/2016   Depression with anxiety 09/30/2016   AF (paroxysmal atrial fibrillation) ([place]) 09/30/2016   Resting tremor 09/30/2016   Mild obstructive sleep apnea 09/30/2016   Headache 08/18/2016   Unstable angina ([place]) 08/03/2016   Type 2 diabetes mellitus with diabetic polyneuropathy, with long-term current use of insulin ([place]) 08/03/2016   Chronic daily headache 05/05/2016   Asthma 11/11/2015   S/P CABG x 4 11/10/2015   Coronary artery disease 11/07/2015   Carotid artery narrowing 02/08/2014   Chest pain 02/08/2014   Mixed hyperlipidemia 02/08/2014   Bilateral carotid artery stenosis 02/08/2014   Atypical chest pain 02/07/2014   Essential hypertension 02/07/2014   Awareness of heartbeats 02/07/2014   D (diarrhea) 10/05/2013   Gastroesophageal reflux disease 10/05/2013    Immunization History  Administered Date(s) Administered   Fluad Quad(high Dose 65+)  12/28/2018, 12/25/2020   Influenza, High Dose Seasonal PF 12/24/2016, 12/08/2017   Influenza,inj,Quad PF,6+ Mos 12/19/2015, 01/18/2020   Influenza-Unspecified 03/06/2015   Moderna Sars-Covid-2 Vaccination 04/14/2019, 05/12/2019, 01/28/2020   Pneumococcal Conjugate-13 03/16/2018   Pneumococcal Polysaccharide-23 12/24/2016   Tdap 07/28/2010   Zoster Recombinat (Shingrix) 06/14/2020    Conditions to be addressed/monitored:  Hypertension, Hyperlipidemia, Diabetes, Atrial Fibrillation, Heart Failure, Coronary Artery Disease, GERD, Asthma, Depression and Anxiety  Care Plan : General Pharmacy (Adult)  Updates made by Germaine Pomfret, RPH since 07/27/2021 12:00 AM     Problem: Hypertension, Hyperlipidemia, Diabetes, Atrial Fibrillation, Heart Failure, Coronary Artery Disease, GERD, Asthma, Depression and Anxiety   Priority: High     Long-Range Goal: Patient-Specific Goal   Start Date: 06/06/2020  Expected End Date: 11/25/2021  This Visit's Progress: On track  Recent Progress:  On track  Priority: High  Note:   Current Barriers:  Unable to achieve control of diabetes  Unable to achieve control of cholesterol Does not adhere to prescribed medication regimen  Pharmacist Clinical Goal(s):  Over the next 90 days, patient will achieve control of diabetes as evidenced by A1c less than 8% patient will achieve control of cholesterol as evidenced by LDL less than 70 adhere to prescribed medication regimen as evidenced by utilization of enhanced pharmacy services through collaboration with PharmD and provider.   Interventions: 1:1 collaboration with [name]., MD regarding development and update of comprehensive plan of care as evidenced by provider attestation and co-signature Inter-disciplinary care team collaboration (see longitudinal plan of care) Comprehensive medication review performed; medication list updated in electronic medical record  Diabetes (A1c goal <8%) -Uncontrolled -Diagnosed 2010 -Current medications:  Trulicity 1.5 mg weekly on Thursdays: Appropriate, Query effective   Jardiance 25 mg daily: Appropriate, Query effective   Lantus 18 units nightly (0.24 u/kg): Appropriate, Query effective   -Medications previously tried: Glipizide, Lantus, Tradjenta, Metformin, Metformin XR, Ozempic   -Current home glucose readings: Has not been monitoring recently. -Denies hypoglycemic symptoms. -Nausea periodically throughout the week. -Current meal patterns: Light meals, mainly salads, yogurt.  -Current exercise: Less exercise due to the weather. Walking 3-4 times weekly (15-20 minutes)    -Continue current medications  Hyperlipidemia: (LDL goal < 55) -History of CABG x4 2017, 2018  -Uncontrolled -Current treatment: Rosuvastatin 40 mg daily: Appropriate, Query effective  Ezetimibe 10 mg daily: Appropriate, Query effective   -Current antiplatelet treatment: Clopidogrel 75 mg daily  -Medications previously tried: Atorvastatin (changed to  rosuvastatin during hospitalization)  -Recommended to continue current medication -Recheck fasting lipid panel at PCP follow-up.   Atrial Fibrillation (Goal: prevent stroke and major bleeding) -Controlled -CHADSVASC: 6 -Current treatment: Rate control: Metoprolol tartrate 100 mg twice daily Anticoagulation: Eliquis 5 mg twice daily -Medications previously tried: NA -Denies symptoms of A-fib -Recommended to continue current medication  Heart Failure (Goal: manage symptoms and prevent exacerbations) -Controlled -Last ejection fraction: 55-60% (Date: June 2021) -HF type: Diastolic -NYHA Class: III (marked limitation of activity) -AHA HF Stage: C (Heart disease and symptoms present) -Current treatment: Entresto 24-26 mg twice daily  Furosemide 20 mg daily Imdur 120 mg daily  Metoprolol tartrate 100 mg twice daily -Medications previously tried: NA  -Current home BP/HR readings: 128/70  -Higher dose of Imdur has not made much difference. Chest pain improved somewhat. Went shopping for the first time this weekend, significant shortness of breath.  -Recommended to continue current medication  Depression/Anxiety (Goal: maintain symptom remission) -Uncontrolled -Current treatment: Sertraline 100 mg daily  -Medications  previously tried/failed: NA -PHQ9: 0 -GAD7: 2 - Patient reports she manages her stress by talking with daughter, reading, or medidating  -Feels worsening shakiness which she attributes to her anxiety, interested in increasing her sertraline at the recommendation of a nurse during her hospitalization.  -Given her low GAD-7 score, would not recommend increasing sertraline at this time. Recommend work-up by PCP to evaluate patient for tremor and chest pain.  -Continue current medications  GERD with dysphagia (Goal: prevent heartburn or reflux symptoms) -Controlled -Current treatment  Pantoprazole 40 mg daily   -Medications previously tried: Sucralfate,  -Continue  current medications  Patient Goals/Self-Care Activities Over the next 90 days, patient will:  - check glucose daily, before breakfast, document, and provide at future appointments check blood pressure weekly, document, and provide at future appointments  Follow Up Plan: Telephone follow up appointment with care management team member scheduled for:  09/04/2021 at 8:30 AM      Medication Assistance: None required.  Patient affirms current coverage meets needs.  Patient's preferred pharmacy is:  [facility] - [place], [place] - [address] [place] [address] Phone: [phone] Fax: [phone]  [id number] [place] [id number]: [phone]: [phone] [id number] [place] [address] [place]: [phone]: [phone] decided to: Utilize [facility] for medication synchronization, packaging and delivery   Care Plan and Follow Up Patient Decision:  Patient agrees to Care Plan and Follow-up.  Plan: Telephone follow up appointment with care management team member scheduled for:  09/04/2021 at 8:30 AM  AlJunius ArgylePharmD, BCPara [phone]

## 2021-07-27 NOTE — Patient Instructions (Signed)
Visit Information ?It was great speaking with you today!  Please let me know if you have any questions about our visit. ? ? Goals Addressed   ? ?  ?  ?  ?  ? This Visit's Progress  ?  Monitor and Manage My Blood Sugar-Diabetes Type 2   Not on track  ?  Timeframe:  Long-Range Goal ?Priority:  High ?Start Date: 04/15/20      ?Expected End Date: 02/26/22 ?                 ? ?Follow Up within 30 days ?  ?- Check blood sugar daily before breakfast ?- Check blood sugar if I feel it is too high or too low ?- Enter blood sugar readings and medication or insulin into daily log ?- Take the blood sugar log to all doctor visits ?- Take the blood sugar meter to all doctor visits  ?  ?  ? ?  ? ? ?Patient Care Plan: General Pharmacy (Adult)  ?  ? ?Problem Identified: Hypertension, Hyperlipidemia, Diabetes, Atrial Fibrillation, Heart Failure, Coronary Artery Disease, GERD, Asthma, Depression and Anxiety   ?Priority: High  ?  ? ?Long-Range Goal: Patient-Specific Goal   ?Start Date: 06/06/2020  ?Expected End Date: 11/25/2021  ?This Visit's Progress: On track  ?Recent Progress: On track  ?Priority: High  ?Note:   ?Current Barriers:  ?Unable to achieve control of diabetes  ?Unable to achieve control of cholesterol ?Does not adhere to prescribed medication regimen ? ?Pharmacist Clinical Goal(s):  ?Over the next 90 days, patient will achieve control of diabetes as evidenced by A1c less than 8% ?patient will achieve control of cholesterol as evidenced by LDL less than 70 ?adhere to prescribed medication regimen as evidenced by utilization of enhanced pharmacy services through collaboration with PharmD and provider.  ? ?Interventions: ?1:1 collaboration with [name]., MD regarding development and update of comprehensive plan of care as evidenced by provider attestation and co-signature ?Inter-disciplinary care team collaboration (see longitudinal plan of care) ?Comprehensive medication review performed; medication list updated in  electronic medical record ? ?Diabetes (A1c goal <8%) ?-Uncontrolled ?-Diagnosed 2010 ?-Current medications:  ?Trulicity 1.5 mg weekly on Thursdays: Appropriate, Query effective   ?Jardiance 25 mg daily: Appropriate, Query effective   ?Lantus 18 units nightly (0.24 u/kg): Appropriate, Query effective   ?-Medications previously tried: Glipizide, Lantus, Tradjenta, Metformin, Metformin XR, Ozempic   ?-Current home glucose readings: Has not been monitoring recently. ?-Denies hypoglycemic symptoms. ?-Nausea periodically throughout the week. ?-Current meal patterns: Light meals, mainly salads, yogurt.  ?-Current exercise: Less exercise due to the weather. Walking 3-4 times weekly (15-20 minutes)    ?-Continue current medications ? ?Hyperlipidemia: (LDL goal < 55) ?-History of CABG x4 2017, 2018  ?-Uncontrolled ?-Current treatment: ?Rosuvastatin 40 mg daily: Appropriate, Query effective  ?Ezetimibe 10 mg daily: Appropriate, Query effective   ?-Current antiplatelet treatment: ?Clopidogrel 75 mg daily  ?-Medications previously tried: Atorvastatin (changed to rosuvastatin during hospitalization)  ?-Recommended to continue current medication ?-Recheck fasting lipid panel at PCP follow-up.  ? ?Atrial Fibrillation (Goal: prevent stroke and major bleeding) ?-Controlled ?-CHADSVASC: 6 ?-Current treatment: ?Rate control: Metoprolol tartrate 100 mg twice daily ?Anticoagulation: Eliquis 5 mg twice daily ?-Medications previously tried: NA ?-Denies symptoms of A-fib ?-Recommended to continue current medication ? ?Heart Failure (Goal: manage symptoms and prevent exacerbations) ?-Controlled ?-Last ejection fraction: 55-60% (Date: June 2021) ?-HF type: Diastolic ?-NYHA Class: III (marked limitation of activity) ?-AHA HF Stage: C (Heart disease and symptoms  present) ?-Current treatment: ?Entresto 24-26 mg twice daily  ?Furosemide 20 mg daily ?Imdur 120 mg daily  ?Metoprolol tartrate 100 mg twice daily ?-Medications previously tried: NA   ?-Current home BP/HR readings: 128/70  ?-Higher dose of Imdur has not made much difference. Chest pain improved somewhat. Went shopping for the first time this weekend, significant shortness of breath.  ?-Recommended to continue current medication ? ?Depression/Anxiety (Goal: maintain symptom remission) ?-Uncontrolled ?-Current treatment: ?Sertraline 100 mg daily  ?-Medications previously tried/failed: NA ?-PHQ9: 0 ?-GAD7: 2 ?- Patient reports she manages her stress by talking with daughter, reading, or medidating  ?-Feels worsening shakiness which she attributes to her anxiety, interested in increasing her sertraline at the recommendation of a nurse during her hospitalization.  ?-Given her low GAD-7 score, would not recommend increasing sertraline at this time. Recommend work-up by PCP to evaluate patient for tremor and chest pain.  ?-Continue current medications ? ?GERD with dysphagia (Goal: prevent heartburn or reflux symptoms) ?-Controlled ?-Current treatment  ?Pantoprazole 40 mg daily   ?-Medications previously tried: Sucralfate,  ?-Continue current medications ? ?Patient Goals/Self-Care Activities ?Over the next 90 days, patient will:  ?- check glucose daily, before breakfast, document, and provide at future appointments ?check blood pressure weekly, document, and provide at future appointments ? ?Follow Up Plan: Telephone follow up appointment with care management team member scheduled for:  09/04/2021 at 8:30 AM ?  ? ?Patient agreed to services and verbal consent obtained.  ? ?The patient verbalized understanding of instructions, educational materials, and care plan provided today and declined offer to receive copy of patient instructions, educational materials, and care plan.  ? ?Junius Argyle, PharmD, BCACP, CPP  ?Clinical Pharmacist Practitioner  ?[facility] ?[phone]  ?

## 2021-07-29 NOTE — Progress Notes (Signed)
?  ?I,Elena D DeSanto,acting as a scribe for [name], MD.,have documented all relevant documentation on the behalf of [name], MD,as directed by  [name], MD while in the presence of [name], MD. ? ? ? ?Established patient visit ? ?I,[name],acting as a scribe for [name], MD.,have documented all relevant documentation on the behalf of [name], MD,as directed by  [name], MD while in the presence of [name], MD. ? ? ?Patient: Candace [name]   DOB: Feb 15, 1951   71 y.o. Female  MRN: [id number] ?Visit Date: 07/30/2021 ? ?Today's healthcare provider: [name], MD  ? ?Chief Complaint  ?Patient presents with  ? Follow-up  ? ?Subjective  ?  ?HPI  ?This is a transition of care visit from hospitalization from 419 to April 21 for chest pain ?Patient comes in today for follow-up.  She has some chest pain on occasion but feels disabled by this because of her chronic anxiety with it.  Her daughter is with her today.  Patient and daughter would like to see another cardiologist for second opinion.  I think this is very reasonable due to her multiple ED visits. ?Recent ED visit is reviewed. ?She is on maximal medical therapy with Plavix Eliquis Zetia isosorbide Toprol all rosuvastatin Entresto and Lasix ? ?Chest pain follow up ?Patient is a 71 year old female who presents for follow up of chest pain.  She was last seen on 06/10/21.  At that time she complained of left sided chest and shoulder pain and shortness of breath.  At that time labs and EKG were obtained and order for referral to cardiology was placed. ? ?Medications: ?Outpatient Medications Prior to Visit  ?Medication Sig  ? acetaminophen (TYLENOL) 325 MG tablet Take 650 mg by mouth every 6 (six) hours as needed for moderate pain or headache.   ? albuterol (VENTOLIN HFA) 108 (90 Base) MCG/ACT inhaler Inhale 2 puffs into the lungs every 6 (six) hours as needed for wheezing  or shortness of breath.  ? Blood Glucose Monitoring Suppl (ONETOUCH VERIO) w/Device KIT Use daily to check blood sugar  ? clopidogrel (PLAVIX) 75 MG tablet Take 1 tablet (75 mg total) by mouth daily.  ? Dulaglutide (TRULICITY) 1.5 UT/6.5YY SOPN Inject 1.5 mg into the skin once a week.  ? ELIQUIS 5 MG TABS tablet Take 5 mg by mouth 2 (two) times daily.  ? empagliflozin (JARDIANCE) 25 MG TABS tablet Take 1 tablet (25 mg total) by mouth daily.  ? ezetimibe (ZETIA) 10 MG tablet Take 1 tablet (10 mg total) by mouth daily.  ? glucose blood test strip Use to check blood sugars daily as instructed  ? insulin glargine (LANTUS SOLOSTAR) 100 UNIT/ML Solostar Pen Inject 18 Units into the skin at bedtime.  ? Insulin Pen Needle 32G X 4 MM MISC Use to inject insulin daily  ? isosorbide mononitrate (IMDUR) 120 MG 24 hr tablet Take 1 tablet (120 mg total) by mouth daily.  ? Lancets (ONETOUCH DELICA PLUS [id number]) MISC USE UP TO 4 TIMES DAILY AS DIRECTED  ? loperamide (IMODIUM) 2 MG capsule Take 2 mg by mouth daily as needed for diarrhea or loose stools.  ? metoprolol tartrate (LOPRESSOR) 100 MG tablet Take 100 mg by mouth 2 (two) times daily.  ? nitroGLYCERIN (NITROSTAT) 0.4 MG SL tablet Place 1 tablet (0.4 mg total) under the tongue every 5 (five) minutes as needed for chest pain.  ?  nystatin (NYSTATIN) powder Apply 1 application topically daily.  ? ondansetron (ZOFRAN-ODT) 8 MG disintegrating tablet Take 1 tablet (8 mg total) by mouth every 8 (eight) hours as needed for refractory nausea / vomiting.  ? pantoprazole (PROTONIX) 20 MG tablet Take 1 tablet (20 mg total) by mouth 2 (two) times daily.  ? rosuvastatin (CRESTOR) 40 MG tablet Take 1 tablet (40 mg total) by mouth daily.  ? sacubitril-valsartan (ENTRESTO) 24-26 MG Take 1 tablet by mouth 2 (two) times daily.  ? sertraline (ZOLOFT) 100 MG tablet Take 1 tablet (100 mg total) by mouth daily.  ? bismuth subsalicylate (PEPTO BISMOL) 262 MG chewable tablet Chew 524 mg by mouth  daily as needed for diarrhea or loose stools or indigestion. (Patient not taking: Reported on 07/20/2021)  ? furosemide (LASIX) 20 MG tablet Take 20 mg by mouth daily. (Patient not taking: Reported on 07/15/2021)  ? ?No facility-administered medications prior to visit.  ? ? ?Review of Systems ? ?Last hemoglobin A1c ?Lab Results  ?Component Value Date  ? HGBA1C 8.2 (H) 07/30/2021  ? ?  ?  Objective  ?  ?BP 117/75 (BP Location: Right Arm, Patient Position: Sitting, Cuff Size: Large)   Pulse 81   Resp 18   Wt 171 lb (77.6 kg)   SpO2 97%   BMI 31.28 kg/m?  ?BP Readings from Last 3 Encounters:  ?07/30/21 117/75  ?07/17/21 118/63  ?07/15/21 112/68  ? ?Wt Readings from Last 3 Encounters:  ?07/30/21 171 lb (77.6 kg)  ?07/15/21 173 lb 1 oz (78.5 kg)  ?07/15/21 173 lb 4 oz (78.6 kg)  ? ?  ? ?Physical Exam ?Vitals and nursing note reviewed.  ?Constitutional:   ?   Appearance: Normal appearance. She is normal weight.  ?HENT:  ?   Right Ear: Tympanic membrane normal.  ?   Left Ear: Tympanic membrane normal.  ?   Nose: Nose normal.  ?   Mouth/Throat:  ?   Mouth: Mucous membranes are moist.  ?   Pharynx: Oropharynx is clear.  ?Eyes:  ?   Conjunctiva/sclera: Conjunctivae normal.  ?Cardiovascular:  ?   Rate and Rhythm: Normal rate and regular rhythm.  ?   Pulses: Normal pulses.  ?   Heart sounds: Normal heart sounds.  ?Pulmonary:  ?   Effort: Pulmonary effort is normal.  ?   Breath sounds: Normal breath sounds.  ?Abdominal:  ?   General: Bowel sounds are normal.  ?   Palpations: Abdomen is soft.  ?Musculoskeletal:     ?   General: Normal range of motion.  ?   Cervical back: Normal range of motion and neck supple.  ?Skin: ?   General: Skin is warm and dry.  ?Neurological:  ?   Mental Status: She is alert.  ?Psychiatric:     ?   Mood and Affect: Mood normal.     ?   Behavior: Behavior normal.     ?   Thought Content: Thought content normal.     ?   Judgment: Judgment normal.  ?  ? ? ?No results found for any visits on 07/30/21. ?  Assessment & Plan  ?  ? ?1. Type 2 diabetes mellitus with diabetic polyneuropathy, with long-term current use of insulin ([place]) ?Goal A1c less than 8.  Certainly worried about hypoglycemia as we get her closer to goal.  For now I just like to see her blood sugars and A1c is down ?- Hemoglobin A1c ? ?2. Anxiety ?Chronic issue.  Reluctantly had Xanax 0.25 every 8 hours as needed ?- ALPRAZolam (XANAX) 0.25 MG tablet; Take 1 tablet (0.25 mg total) by mouth every 8 (eight) hours as needed for anxiety.  Dispense: 90 tablet; Refill: 5 ? ?3. Unstable angina (HCC) ?Refer to cardiology for second opinion.  Presently on maximal medical therapy. ?Transition of care visit today.  Daughter is with her and both are in agreement with our plan. ?She may benefit from cardiac rehab just to get her moving some in a supervised environment ? ?4. Coronary artery disease involving native coronary artery of native heart with unstable angina pectoris (HCC)/ ?Status post CABG x4 in 2017 ? ?5. Chronic diastolic CHF (congestive heart failure) ([place]) ?Diastolic dysfunction ? ?6. Essential hypertension ?Good blood pressure control ? ?7. AF (paroxysmal atrial fibrillation) ([place]) ?On Eliquis ? ?8. Depression with anxiety ?On sertraline and now alprazolam. ?Follow-up in about 3 months repeat A1c ? ? ?Return in about 4 months (around 11/30/2021).  ?   ? ?I, [name], MD, have reviewed all documentation for this visit. The documentation on 08/06/21 for the exam, diagnosis, procedures, and orders are all accurate and complete. ? ? ? ?Jaziyah Gradel Cranford Mon, MD  ?[facility] ?[phone] (phone) ?[phone] (fax) ? ?[facility] ?

## 2021-07-30 ENCOUNTER — Ambulatory Visit (INDEPENDENT_AMBULATORY_CARE_PROVIDER_SITE_OTHER): Payer: 59 | Admitting: Family Medicine

## 2021-07-30 ENCOUNTER — Encounter: Payer: Self-pay | Admitting: Family Medicine

## 2021-07-30 VITALS — BP 117/75 | HR 81 | Resp 18 | Wt 171.0 lb

## 2021-07-30 DIAGNOSIS — I200 Unstable angina: Secondary | ICD-10-CM

## 2021-07-30 DIAGNOSIS — Z794 Long term (current) use of insulin: Secondary | ICD-10-CM

## 2021-07-30 DIAGNOSIS — F419 Anxiety disorder, unspecified: Secondary | ICD-10-CM | POA: Diagnosis not present

## 2021-07-30 DIAGNOSIS — I25110 Atherosclerotic heart disease of native coronary artery with unstable angina pectoris: Secondary | ICD-10-CM

## 2021-07-30 DIAGNOSIS — E1142 Type 2 diabetes mellitus with diabetic polyneuropathy: Secondary | ICD-10-CM | POA: Diagnosis not present

## 2021-07-30 DIAGNOSIS — I5032 Chronic diastolic (congestive) heart failure: Secondary | ICD-10-CM

## 2021-07-30 DIAGNOSIS — Z951 Presence of aortocoronary bypass graft: Secondary | ICD-10-CM

## 2021-07-30 DIAGNOSIS — F418 Other specified anxiety disorders: Secondary | ICD-10-CM

## 2021-07-30 DIAGNOSIS — I480 Paroxysmal atrial fibrillation: Secondary | ICD-10-CM

## 2021-07-30 DIAGNOSIS — I10 Essential (primary) hypertension: Secondary | ICD-10-CM

## 2021-07-30 MED ORDER — ALPRAZOLAM 0.25 MG PO TABS
0.2500 mg | ORAL_TABLET | Freq: Three times a day (TID) | ORAL | 5 refills | Status: DC | PRN
Start: 2021-07-30 — End: 2022-07-02

## 2021-07-31 LAB — HEMOGLOBIN A1C
Est. average glucose Bld gHb Est-mCnc: 189 mg/dL
Hgb A1c MFr Bld: 8.2 % — ABNORMAL HIGH (ref 4.8–5.6)

## 2021-08-03 ENCOUNTER — Telehealth: Payer: Self-pay | Admitting: *Deleted

## 2021-08-03 ENCOUNTER — Telehealth: Payer: Self-pay

## 2021-08-03 NOTE — Telephone Encounter (Signed)
Patient needs to reschedule her appointment on 09/04/2021 at 8:30 am with Pharmacist.  ?

## 2021-08-03 NOTE — Progress Notes (Signed)
Per Clinical pharmacist, Please reach out to patient to reschedule her follow up with me on 09/04/2021. ? ?Telephone follow up appointment with Care management team member was rescheduled for : 09/07/2021  at 8:30 am. ? ?Anderson Malta ?Clinical Pharmacist Assistant ?[phone]  ? ?

## 2021-08-06 ENCOUNTER — Other Ambulatory Visit: Payer: Self-pay | Admitting: Family Medicine

## 2021-08-06 DIAGNOSIS — I152 Hypertension secondary to endocrine disorders: Secondary | ICD-10-CM

## 2021-08-10 ENCOUNTER — Telehealth: Payer: Self-pay

## 2021-08-10 ENCOUNTER — Ambulatory Visit (INDEPENDENT_AMBULATORY_CARE_PROVIDER_SITE_OTHER): Payer: 59

## 2021-08-10 VITALS — Wt 171.0 lb

## 2021-08-10 DIAGNOSIS — Z0000 Encounter for general adult medical examination without abnormal findings: Secondary | ICD-10-CM

## 2021-08-10 NOTE — Progress Notes (Signed)
? ? ?Chronic Care Management ?Pharmacy Assistant  ? ?Name: Candace [name]  MRN: [id number] DOB: Apr 08, 1950 ? ?Reason for Encounter: Medication Review/Medication Coordination Call. ?  ?Recent office visits:  ?08/10/2021 Kirke Shaggy LPN (PCP Office) Medicare Wellness completed, No medication Changes noted ?07/30/2021 Dr. Rosanna Randy MD (PCP) start Alprazolam 0.25 mg PRN, Return in about 4 months  ? ?Recent consult visits:  ?No recent consult visit ? ?Hospital visits:  ?None in previous 6 months ? ?Medications: ?Outpatient Encounter Medications as of 08/10/2021  ?Medication Sig Note  ? acetaminophen (TYLENOL) 325 MG tablet Take 650 mg by mouth every 6 (six) hours as needed for moderate pain or headache.    ? albuterol (VENTOLIN HFA) 108 (90 Base) MCG/ACT inhaler Inhale 2 puffs into the lungs every 6 (six) hours as needed for wheezing or shortness of breath.   ? ALPRAZolam (XANAX) 0.25 MG tablet Take 1 tablet (0.25 mg total) by mouth every 8 (eight) hours as needed for anxiety.   ? bismuth subsalicylate (PEPTO BISMOL) 262 MG chewable tablet Chew 524 mg by mouth daily as needed for diarrhea or loose stools or indigestion. (Patient not taking: Reported on 07/20/2021)   ? Blood Glucose Monitoring Suppl (ONETOUCH VERIO) w/Device KIT Use daily to check blood sugar   ? clopidogrel (PLAVIX) 75 MG tablet Take 1 tablet (75 mg total) by mouth daily.   ? Dulaglutide (TRULICITY) 1.5 TL/5.7WI SOPN Inject 1.5 mg into the skin once a week. 07/15/2021: Thurdays  ? ELIQUIS 5 MG TABS tablet Take 5 mg by mouth 2 (two) times daily. 06/06/2020: Prescribed by Dr. Nehemiah Massed  ? empagliflozin (JARDIANCE) 25 MG TABS tablet Take 1 tablet (25 mg total) by mouth daily.   ? ezetimibe (ZETIA) 10 MG tablet Take 1 tablet (10 mg total) by mouth daily.   ? furosemide (LASIX) 20 MG tablet Take 20 mg by mouth daily. (Patient not taking: Reported on 08/10/2021) 07/15/2021: Stopped taking today (07-15-2021)  ? glucose blood test strip Use to check blood sugars daily  as instructed   ? insulin glargine (LANTUS SOLOSTAR) 100 UNIT/ML Solostar Pen Inject 18 Units into the skin at bedtime.   ? Insulin Pen Needle 32G X 4 MM MISC Use to inject insulin daily   ? isosorbide mononitrate (IMDUR) 120 MG 24 hr tablet Take 1 tablet (120 mg total) by mouth daily.   ? Lancets (ONETOUCH DELICA PLUS [id number]) MISC USE UP TO 4 TIMES DAILY AS DIRECTED   ? loperamide (IMODIUM) 2 MG capsule Take 2 mg by mouth daily as needed for diarrhea or loose stools. 07/20/2021: PRN only   ? metoprolol tartrate (LOPRESSOR) 100 MG tablet Take 100 mg by mouth 2 (two) times daily. 06/06/2020: Prescribed by Dr. Nehemiah Massed ?  ? nitroGLYCERIN (NITROSTAT) 0.4 MG SL tablet Place 1 tablet (0.4 mg total) under the tongue every 5 (five) minutes as needed for chest pain.   ? nystatin (NYSTATIN) powder Apply 1 application topically daily.   ? ondansetron (ZOFRAN-ODT) 8 MG disintegrating tablet Take 1 tablet (8 mg total) by mouth every 8 (eight) hours as needed for refractory nausea / vomiting. 07/20/2021: PRN only   ? pantoprazole (PROTONIX) 20 MG tablet Take 1 tablet (20 mg total) by mouth 2 (two) times daily.   ? rosuvastatin (CRESTOR) 40 MG tablet Take 1 tablet (40 mg total) by mouth daily.   ? sacubitril-valsartan (ENTRESTO) 24-26 MG Take 1 tablet by mouth 2 (two) times daily.   ? sertraline (ZOLOFT) 100 MG tablet Take 1 tablet (  100 mg total) by mouth daily.   ? [DISCONTINUED] cetirizine (ZYRTEC) 5 MG tablet Take 2 tablets (10 mg total) by mouth daily.   ? ?No facility-administered encounter medications on file as of 08/10/2021.  ? ? ?Care Gaps: ?COVID 19 Vaccine (4- Booster) ?Ophthalmology Exam (Last Completed 06/28/2019) ?Tetanus Vaccine (Last Completed 07/28/2010) ?Shingrix Vaccine (2 of 2) (Last Completed 06/14/2020) ?Foot Exam (Last Completed 10/27/2019) ?  ?Star Rating Drugs: ?05/18/2021 Rosuvastatin 40 mg last filled for a 90- Day supply at [facility] ?05/18/2021 Losartan 25 mg last filled for a 90-Day supply at  [facility] ?05/18/2021 Jardiance 25 mg last filled for a 90-Day Supply at [facility] ?28/36/6294 Trulicity 1.5 mg last filled for a 84-Day supply at [facility] ? ?Medication Fill Gaps: ?None ID ? ?Reviewed chart for medication changes ahead of medication coordination call. ? ?BP Readings from Last 3 Encounters:  ?07/30/21 117/75  ?07/17/21 118/63  ?07/15/21 112/68  ?  ?Lab Results  ?Component Value Date  ? HGBA1C 8.2 (H) 07/30/2021  ?  ? ?Patient obtains medications through Adherence Packaging  90 Days  ? ?Last adherence delivery included: ?Pantoprazole 20 mg one tablet two times a day- breakfast, Evening meals ?Losartan 25 mg one tablet daily- Bedtime ?Eliquis 5 mg 1 tablet two times daily breakfast, evening meals ?Jardiance 25 mg 1 tablet daily before breakfast ?Rosuvastatin 40 mg 1 tablet daily - Breakfast ?Sertraline 100 mg 1 tablet daily breakfast  ?Metoprolol tartrate 100 mg 1 tablet 2 times daily Breakfast and Evening Meals ?Nitroglycerin 0.4 mg PRN ?Onetouch Ultra Test Strip  ? ?Patient declined medications  last month: ?Trulicity inject 1.5 mg into the skin weekly (last filled 05/07/2021 for 28 day supply at [place])  ?Insulin Glargine 100 unit/ML Solostar Pen- Inject 14 units into the skin at bedtime (last filled 03/24/2021 for 100 day supply from [facility] pharmacy) ?Pen Needles - adequate supply (last filled 04/10/2021 for 90 day at [facility] pharmacy) ?Isosorbide mononitrate 120 mg daily breakfast - (receive 90 day supply from [facility] on 05/04/2021) ?ezetimibe 10 mg 1 tablet daily breakfast - (receive 90 day supply from [facility] on 03/24/2021). ?Clopidogrel 75 mg 1 tablet daily breakfast (receive 90 day supply from [facility] on 03/24/2021). ?Ondansetron 8 mg PRN (Adequate supply, 7 day supply given on 04/23/2021) ?Furosemide 20 mg 1 tablet daily -Breakfast, (Adequate supply 90 day supply given on 12/01/2020 from [facility]). ? ?Patient is due for next adherence  delivery on: 08/20/2021. ?Called patient and reviewed medications and coordinated delivery. ? ?Unable to reach patient to completed Medication Coordination form. Form was completed based on last month delivery. [facility] will contact patient to confirm delivery.Junius Argyle, CPP was notified I was unable to reach patient ? ?This delivery to include: ?Pantoprazole 20 mg one tablet two times a day- breakfast, Evening meals ?Losartan 25 mg one tablet daily- Bedtime ?Eliquis 5 mg 1 tablet two times daily breakfast, evening meals ?Jardiance 25 mg 1 tablet daily before breakfast ?Rosuvastatin 40 mg 1 tablet daily - Breakfast ?Sertraline 100 mg 1 tablet daily breakfast  ?Metoprolol tartrate 100 mg 1 tablet 2 times daily Breakfast and Evening Meals ?Isosorbide Mononitrate 120 mg 1 tablet daily - Breakfast ?Ondansetron 8 mg PRN ?Nitroglycerin 0.4 mg PRN ?Onetouch Ultra Test Strip  ? ? ?Patient declined the following medications: ?Trulicity inject 1.5 mg into the skin weekly (last filled 06/30/2021 for 84 day supply at [place])  ?Insulin Glargine 100 unit/ML Solostar Pen- Inject 14 units into the  skin at bedtime (last filled 06/30/2021 for 84 day supply from [facility]) ?Pen Needles - adequate supply (last filled 06/30/2021 for 90 day at [facility]) ?ezetimibe 10 mg 1 tablet daily breakfast - (receive 90 day supply from [facility] on 03/24/2021). ?Clopidogrel 75 mg 1 tablet daily breakfast (receive 90 day supply from [facility] on 03/24/2021). ? ?Patient needs refills for Losartan. ? ?Unable to confirmed delivery date of 08/20/2021 (First route). ? ?Anderson Malta ?Clinical Pharmacist Assistant ?[phone]  ? ?

## 2021-08-10 NOTE — Patient Instructions (Signed)
Candace [name] , ?Thank you for taking time to come for your Medicare Wellness Visit. I appreciate your ongoing commitment to your health goals. Please review the following plan we discussed and let me know if I can assist you in the future.  ? ?Screening recommendations/referrals: ?Colonoscopy: 11/03/18 ?Mammogram: 12/25/20 ?Bone Density: 05/10/18 ?Recommended yearly ophthalmology/optometry visit for glaucoma screening and checkup ?Recommended yearly dental visit for hygiene and checkup ? ?Vaccinations: ?Influenza vaccine: 12/25/20 ?Pneumococcal vaccine: 03/16/18 ?Tdap vaccine: 07/28/10, due ?Shingles vaccine: Shingrix 06/14/20   ?Covid-19:04/14/19, 05/12/19, 01/28/20 ? ?Advanced directives: yes ? ?Conditions/risks identified: none ? ?Next appointment: Follow up in one year for your annual wellness visit 08/12/22 @ 8:15am by phone ? ? ?Preventive Care 10 Years and Older, Female ?Preventive care refers to lifestyle choices and visits with your health care provider that can promote health and wellness. ?What does preventive care include? ?A yearly physical exam. This is also called an annual well check. ?Dental exams once or twice a year. ?Routine eye exams. Ask your health care provider how often you should have your eyes checked. ?Personal lifestyle choices, including: ?Daily care of your teeth and gums. ?Regular physical activity. ?Eating a healthy diet. ?Avoiding tobacco and drug use. ?Limiting alcohol use. ?Practicing safe sex. ?Taking low-dose aspirin every day. ?Taking vitamin and mineral supplements as recommended by your health care provider. ?What happens during an annual well check? ?The services and screenings done by your health care provider during your annual well check will depend on your age, overall health, lifestyle risk factors, and family history of disease. ?Counseling  ?Your health care provider may ask you questions about your: ?Alcohol use. ?Tobacco use. ?Drug use. ?Emotional well-being. ?Home and  relationship well-being. ?Sexual activity. ?Eating habits. ?History of falls. ?Memory and ability to understand (cognition). ?Work and work Statistician. ?Reproductive health. ?Screening  ?You may have the following tests or measurements: ?Height, weight, and BMI. ?Blood pressure. ?Lipid and cholesterol levels. These may be checked every 5 years, or more frequently if you are over 56 years old. ?Skin check. ?Lung cancer screening. You may have this screening every year starting at age 62 if you have a 30-pack-year history of smoking and currently smoke or have quit within the past 15 years. ?Fecal occult blood test (FOBT) of the stool. You may have this test every year starting at age 38. ?Flexible sigmoidoscopy or colonoscopy. You may have a sigmoidoscopy every 5 years or a colonoscopy every 10 years starting at age 71. ?Hepatitis C blood test. ?Hepatitis B blood test. ?Sexually transmitted disease (STD) testing. ?Diabetes screening. This is done by checking your blood sugar (glucose) after you have not eaten for a while (fasting). You may have this done every 1-3 years. ?Bone density scan. This is done to screen for osteoporosis. You may have this done starting at age 54. ?Mammogram. This may be done every 1-2 years. Talk to your health care provider about how often you should have regular mammograms. ?Talk with your health care provider about your test results, treatment options, and if necessary, the need for more tests. ?Vaccines  ?Your health care provider may recommend certain vaccines, such as: ?Influenza vaccine. This is recommended every year. ?Tetanus, diphtheria, and acellular pertussis (Tdap, Td) vaccine. You may need a Td booster every 10 years. ?Zoster vaccine. You may need this after age 10. ?Pneumococcal 13-valent conjugate (PCV13) vaccine. One dose is recommended after age 97. ?Pneumococcal polysaccharide (PPSV23) vaccine. One dose is recommended after age 14. ?Talk to your health  care provider  about which screenings and vaccines you need and how often you need them. ?This information is not intended to replace advice given to you by your health care provider. Make sure you discuss any questions you have with your health care provider. ?Document Released: 04/11/2015 Document Revised: 12/03/2015 Document Reviewed: 01/14/2015 ?[employer] Interactive Patient Education ? 2017 [employer]. ? ?Fall Prevention in the Home ?Falls can cause injuries. They can happen to people of all ages. There are many things you can do to make your home safe and to help prevent falls. ?What can I do on the outside of my home? ?Regularly fix the edges of walkways and driveways and fix any cracks. ?Remove anything that might make you trip as you walk through a door, such as a raised step or threshold. ?Trim any bushes or trees on the path to your home. ?Use bright outdoor lighting. ?Clear any walking paths of anything that might make someone trip, such as rocks or tools. ?Regularly check to see if handrails are loose or broken. Make sure that both sides of any steps have handrails. ?Any raised decks and porches should have guardrails on the edges. ?Have any leaves, snow, or ice cleared regularly. ?Use sand or salt on walking paths during winter. ?Clean up any spills in your garage right away. This includes oil or grease spills. ?What can I do in the bathroom? ?Use night lights. ?Install grab bars by the toilet and in the tub and shower. Do not use towel bars as grab bars. ?Use non-skid mats or decals in the tub or shower. ?If you need to sit down in the shower, use a plastic, non-slip stool. ?Keep the floor dry. Clean up any water that spills on the floor as soon as it happens. ?Remove soap buildup in the tub or shower regularly. ?Attach bath mats securely with double-sided non-slip rug tape. ?Do not have throw rugs and other things on the floor that can make you trip. ?What can I do in the bedroom? ?Use night lights. ?Make sure  that you have a light by your bed that is easy to reach. ?Do not use any sheets or blankets that are too big for your bed. They should not hang down onto the floor. ?Have a firm chair that has side arms. You can use this for support while you get dressed. ?Do not have throw rugs and other things on the floor that can make you trip. ?What can I do in the kitchen? ?Clean up any spills right away. ?Avoid walking on wet floors. ?Keep items that you use a lot in easy-to-reach places. ?If you need to reach something above you, use a strong step stool that has a grab bar. ?Keep electrical cords out of the way. ?Do not use floor polish or wax that makes floors slippery. If you must use wax, use non-skid floor wax. ?Do not have throw rugs and other things on the floor that can make you trip. ?What can I do with my stairs? ?Do not leave any items on the stairs. ?Make sure that there are handrails on both sides of the stairs and use them. Fix handrails that are broken or loose. Make sure that handrails are as long as the stairways. ?Check any carpeting to make sure that it is firmly attached to the stairs. Fix any carpet that is loose or worn. ?Avoid having throw rugs at the top or bottom of the stairs. If you do have throw rugs, attach them to  the floor with carpet tape. ?Make sure that you have a light switch at the top of the stairs and the bottom of the stairs. If you do not have them, ask someone to add them for you. ?What else can I do to help prevent falls? ?Wear shoes that: ?Do not have high heels. ?Have rubber bottoms. ?Are comfortable and fit you well. ?Are closed at the toe. Do not wear sandals. ?If you use a stepladder: ?Make sure that it is fully opened. Do not climb a closed stepladder. ?Make sure that both sides of the stepladder are locked into place. ?Ask someone to hold it for you, if possible. ?Clearly mark and make sure that you can see: ?Any grab bars or handrails. ?First and last steps. ?Where the edge of  each step is. ?Use tools that help you move around (mobility aids) if they are needed. These include: ?Canes. ?Walkers. ?Scooters. ?Crutches. ?Turn on the lights when you go into a dark area. Replace any light bul

## 2021-08-10 NOTE — Progress Notes (Signed)
Virtual Visit via Telephone Note  I connected with  Candace [name] on 08/10/21 at  8:45 AM EDT by telephone and verified that I am speaking with the correct person using two identifiers.  Location: Patient: home Provider: [facility] Persons participating in the virtual visit: [place]   I discussed the limitations, risks, security and privacy concerns of performing an evaluation and management service by telephone and the availability of in person appointments. The patient expressed understanding and agreed to proceed.  Interactive audio and video telecommunications were attempted between this nurse and patient, however failed, due to patient having technical difficulties OR patient did not have access to video capability.  We continued and completed visit with audio only.  Some vital signs may be absent or patient reported.   Dionisio David, LPN  Subjective:   Candace [name] is a 71 y.o. female who presents for Medicare Annual (Subsequent) preventive examination.  Review of Systems           Objective:    There were no vitals filed for this visit. There is no height or weight on file to calculate BMI.     07/15/2021    6:00 PM 07/15/2021   10:41 AM 02/09/2021   12:00 AM 02/08/2021   11:19 PM 12/04/2020   11:11 AM 10/04/2020    7:51 PM 09/24/2020    8:42 AM  Advanced Directives  Does Patient Have a Medical Advance Directive? Yes Yes  Yes No No No  Type of Paramedic of [place];Living will [place];Living will Living will;Healthcare Power of Attorney      Does patient want to make changes to medical advance directive?   No - Patient declined No - Patient declined     Copy of [place] in Chart? No - copy requested        Would patient like information on creating a medical advance directive?     No - Patient declined No - Patient declined No - Patient declined    Current Medications  (verified) Outpatient Encounter Medications as of 08/10/2021  Medication Sig   acetaminophen (TYLENOL) 325 MG tablet Take 650 mg by mouth every 6 (six) hours as needed for moderate pain or headache.    albuterol (VENTOLIN HFA) 108 (90 Base) MCG/ACT inhaler Inhale 2 puffs into the lungs every 6 (six) hours as needed for wheezing or shortness of breath.   ALPRAZolam (XANAX) 0.25 MG tablet Take 1 tablet (0.25 mg total) by mouth every 8 (eight) hours as needed for anxiety.   bismuth subsalicylate (PEPTO BISMOL) 262 MG chewable tablet Chew 524 mg by mouth daily as needed for diarrhea or loose stools or indigestion. (Patient not taking: Reported on 07/20/2021)   Blood Glucose Monitoring Suppl (ONETOUCH VERIO) w/Device KIT Use daily to check blood sugar   clopidogrel (PLAVIX) 75 MG tablet Take 1 tablet (75 mg total) by mouth daily.   Dulaglutide (TRULICITY) 1.5 IW/5.8KD SOPN Inject 1.5 mg into the skin once a week.   ELIQUIS 5 MG TABS tablet Take 5 mg by mouth 2 (two) times daily.   empagliflozin (JARDIANCE) 25 MG TABS tablet Take 1 tablet (25 mg total) by mouth daily.   ezetimibe (ZETIA) 10 MG tablet Take 1 tablet (10 mg total) by mouth daily.   furosemide (LASIX) 20 MG tablet Take 20 mg by mouth daily. (Patient not taking: Reported on 07/15/2021)   glucose blood test strip Use to check blood sugars  daily as instructed   insulin glargine (LANTUS SOLOSTAR) 100 UNIT/ML Solostar Pen Inject 18 Units into the skin at bedtime.   Insulin Pen Needle 32G X 4 MM MISC Use to inject insulin daily   isosorbide mononitrate (IMDUR) 120 MG 24 hr tablet Take 1 tablet (120 mg total) by mouth daily.   Lancets (ONETOUCH DELICA PLUS [id number]) MISC USE UP TO 4 TIMES DAILY AS DIRECTED   loperamide (IMODIUM) 2 MG capsule Take 2 mg by mouth daily as needed for diarrhea or loose stools.   metoprolol tartrate (LOPRESSOR) 100 MG tablet Take 100 mg by mouth 2 (two) times daily.   nitroGLYCERIN (NITROSTAT) 0.4 MG SL tablet Place 1  tablet (0.4 mg total) under the tongue every 5 (five) minutes as needed for chest pain.   nystatin (NYSTATIN) powder Apply 1 application topically daily.   ondansetron (ZOFRAN-ODT) 8 MG disintegrating tablet Take 1 tablet (8 mg total) by mouth every 8 (eight) hours as needed for refractory nausea / vomiting.   pantoprazole (PROTONIX) 20 MG tablet Take 1 tablet (20 mg total) by mouth 2 (two) times daily.   rosuvastatin (CRESTOR) 40 MG tablet Take 1 tablet (40 mg total) by mouth daily.   sacubitril-valsartan (ENTRESTO) 24-26 MG Take 1 tablet by mouth 2 (two) times daily.   sertraline (ZOLOFT) 100 MG tablet Take 1 tablet (100 mg total) by mouth daily.   [DISCONTINUED] cetirizine (ZYRTEC) 5 MG tablet Take 2 tablets (10 mg total) by mouth daily.   No facility-administered encounter medications on file as of 08/10/2021.    Allergies (verified) Tramadol, Lisinopril, and Penicillins   History: Past Medical History:  Diagnosis Date   Allergy    Anemia    Anxiety    Arrhythmia    Arthritis    Atrial fibrillation (HCC)    CHF (congestive heart failure) (HCC)    COPD (chronic obstructive pulmonary disease) (HCC)    Coronary artery disease    Depression    Diabetes mellitus without complication (HCC)    Dyspnea    doe   Dysrhythmia    GERD (gastroesophageal reflux disease)    Headache    History of hiatal hernia    Hyperlipidemia    Hypertension    Myocardial infarction ([place])    2016, 04/2017   Myocardial infarction with cardiac rehabilitation [facility])    MI 2016/ CABG 8/17    FINISHED CARDIAC REHAB 3 WEEKS AGO   Panic attack    Pneumonia    Reflux    Stroke ([place]) 2015   showed up on MRI; no weakness noted   TIA (transient ischemic attack)    Voice tremor    Past Surgical History:  Procedure Laterality Date   ABDOMINAL HYSTERECTOMY     APPENDECTOMY  1975   ARTERY BIOPSY Right 04/26/2016   Procedure: BIOPSY TEMPORAL ARTERY;  Surgeon: Margaretha Sheffield, MD;  Location: [facility] ORS;  Service:  ENT;  Laterality: Right;   CARDIAC CATHETERIZATION N/A 11/06/2015   Procedure: Left Heart Cath and Coronary Angiography;  Surgeon: Corey Skains, MD;  Location: [place] CV LAB;  Service: Cardiovascular;  Laterality: N/A;   CESAREAN SECTION     COLONOSCOPY  2015   COLONOSCOPY WITH PROPOFOL N/A 11/03/2018   Procedure: COLONOSCOPY WITH PROPOFOL;  Surgeon: Virgel Manifold, MD;  Location: [facility] ENDOSCOPY;  Service: Endoscopy;  Laterality: N/A;   CORONARY ANGIOPLASTY  04/2017   [place]   CORONARY ARTERY BYPASS GRAFT N/A 11/10/2015   Procedure: CORONARY ARTERY  BYPASS GRAFTING (CABG), ON PUMP, TIMES FOUR, USING LEFT INTERNAL MAMMARY ARTERY, BILATERAL GREATER SAPHENOUS VEINS HARVESTED ENDOSCOPICALLY;  Surgeon: [name], MD;  Location: [place];  Service: Open Heart Surgery;  Laterality: N/A;  LIMA-LAD; SEQ SVG-OM1-OM2; SVG-PL   CORONARY STENT INTERVENTION N/A 08/05/2016   Procedure: Coronary Stent Intervention;  Surgeon: Isaias Cowman, MD;  Location: [place] CV LAB;  Service: Cardiovascular;  Laterality: N/A;   ESOPHAGOGASTRODUODENOSCOPY (EGD) WITH PROPOFOL N/A 11/03/2018   Procedure: ESOPHAGOGASTRODUODENOSCOPY (EGD) WITH PROPOFOL;  Surgeon: Virgel Manifold, MD;  Location: [facility] ENDOSCOPY;  Service: Endoscopy;  Laterality: N/A;   HYSTERECTOMY ABDOMINAL WITH SALPINGO-OOPHORECTOMY Bilateral 08/15/2017   Procedure: HYSTERECTOMY ABDOMINAL WITH BILATERAL SALPINGO-OOPHORECTOMY;  Surgeon: Rubie Maid, MD;  Location: [facility] ORS;  Service: Gynecology;  Laterality: Bilateral;   LEFT HEART CATH AND CORONARY ANGIOGRAPHY N/A 08/05/2016   Procedure: Left Heart Cath and Coronary Angiography;  Surgeon: Isaias Cowman, MD;  Location: [place] CV LAB;  Service: Cardiovascular;  Laterality: N/A;   LEFT HEART CATH AND CORS/GRAFTS ANGIOGRAPHY N/A 09/19/2018   Procedure: LEFT HEART CATH AND CORS/GRAFTS ANGIOGRAPHY;  Surgeon: Corey Skains, MD;  Location: [place] CV  LAB;  Service: Cardiovascular;  Laterality: N/A;   LEFT HEART CATH AND CORS/GRAFTS ANGIOGRAPHY N/A 08/30/2019   Procedure: LEFT HEART CATH AND CORS/GRAFTS ANGIOGRAPHY;  Surgeon: Teodoro Spray, MD;  Location: [place] CV LAB;  Service: Cardiovascular;  Laterality: N/A;   OOPHORECTOMY     TEE WITHOUT CARDIOVERSION N/A 11/10/2015   Procedure: TRANSESOPHAGEAL ECHOCARDIOGRAM (TEE);  Surgeon: [name], MD;  Location: [place];  Service: Open Heart Surgery;  Laterality: N/A;   TUBAL LIGATION     Family History  Problem Relation Age of Onset   Cancer Father    Hypertension Father    Heart disease Father    Asthma Father    Cancer Mother    Hypertension Mother    Pancreatic cancer Mother 21   Cancer Sister    Breast cancer Sister 8   Breast cancer Sister 76   Lung cancer Brother    Pancreatic cancer Sister 43   Cancer Sister    Social History   Socioeconomic History   Marital status: Divorced    Spouse name: Not on file   Number of children: 3   Years of education: Not on file   Highest education level: Some college, no degree  Occupational History   Occupation: retired  Tobacco Use   Smoking status: Former    Packs/day: 0.50    Types: Cigarettes    Quit date: 10/07/2001    Years since quitting: 19.8   Smokeless tobacco: Never  Vaping Use   Vaping Use: Never used  Substance and Sexual Activity   Alcohol use: No    Alcohol/week: 0.0 standard drinks   Drug use: No   Sexual activity: Not Currently  Other Topics Concern   Not on file  Social History Narrative   Lives at home alone   Social Determinants of Health   Financial Resource Strain: Low Risk    Difficulty of Paying Living Expenses: Not hard at all  Food Insecurity: Not on file  Transportation Needs: Not on file  Physical Activity: Not on file  Stress: Not on file  Social Connections: Not on file    Tobacco Counseling Counseling given: Not Answered   Clinical Intake:  Pre-visit preparation  completed: Yes  Pain : No/denies pain     Nutritional Risks: None Diabetes: Yes CBG done?: No  Did pt. bring in CBG monitor from home?: No  How often do you need to have someone help you when you read instructions, pamphlets, or other written materials from your doctor or pharmacy?: 1 - Never  Diabetic?yes Nutrition Risk Assessment:  Has the patient had any N/V/D within the last 2 months?  Yes  Does the patient have any non-healing wounds?  No  Has the patient had any unintentional weight loss or weight gain?  No   Diabetes:  Is the patient diabetic?  Yes  If diabetic, was a CBG obtained today?  No  Did the patient bring in their glucometer from home?  No  How often do you monitor your CBG's? Once per day   Financial Strains and Diabetes Management:  Are you having any financial strains with the device, your supplies or your medication? No .  Does the patient want to be seen by Chronic Care Management for management of their diabetes?  No  Would the patient like to be referred to a Nutritionist or for Diabetic Management?  No   Diabetic Exams:  Diabetic Eye Exam: Completed 06/28/19, has appointment coming up. Overdue for diabetic eye exam. Pt has been advised about the importance in completing this exam.   Diabetic Foot Exam: Completed 10/27/19. Pt has been advised about the importance in completing this exam.    Interpreter Needed?: No  Information entered by :: Kirke Shaggy, LPN   Activities of Daily Living    07/15/2021    6:40 PM 07/15/2021    6:00 PM  In your present state of health, do you have any difficulty performing the following activities:  Hearing? 0 0  Vision? 0 0  Difficulty concentrating or making decisions? 0 0  Walking or climbing stairs? 0 0  Dressing or bathing? 0 0  Doing errands, shopping? 0     Patient Care Team: [name]., MD as PCP - General (Family Medicine) Corey Skains, MD as Consulting Physician  (Cardiology) Sharlotte Alamo, DPM (Podiatry) Neldon Labella, RN as Case Manager Virgel Manifold, MD (Inactive) as Consulting Physician (Gastroenterology) [place], [place], [facility] as Pharmacist (Pharmacist)  Indicate any recent Medical Services you may have received from other than [facility] providers in the past year (date may be approximate).     Assessment:   This is a routine wellness examination for Candace [name].  Hearing/Vision screen No results found.  Dietary issues and exercise activities discussed:     Goals Addressed   None    Depression Screen    07/15/2021    9:13 AM 06/30/2021    1:10 PM 10/29/2020    8:49 AM 08/05/2020    9:11 AM 04/15/2020    9:37 AM 04/02/2020    3:31 PM 02/06/2020    8:57 AM  PHQ 2/9 Scores  PHQ - 2 Score 0 0 0 2 0 0 1  PHQ- 9 Score  '3 3 5   4    ' Fall Risk    07/15/2021    9:12 AM 06/23/2021   10:18 AM 08/01/2020    3:10 PM 04/02/2020    3:34 PM 02/06/2020    8:57 AM  Fall Risk   Falls in the past year? 0 0 1 0 0  Number falls in past yr: 0 0 0 0 0  Injury with Fall? 0 0  0 0  Risk for fall due to : Impaired balance/gait      Follow up Falls  evaluation completed;Falls prevention discussed;Education provided Falls evaluation completed   Falls evaluation completed    FALL RISK PREVENTION PERTAINING TO THE HOME:  Any stairs in or around the home? No  If so, are there any without handrails? No  Home free of loose throw rugs in walkways, pet beds, electrical cords, etc? Yes  Adequate lighting in your home to reduce risk of falls? Yes   ASSISTIVE DEVICES UTILIZED TO PREVENT FALLS:  Life alert? No  Use of a cane, walker or w/c? No  Grab bars in the bathroom? Yes  Shower chair or bench in shower? Yes  Elevated toilet seat or a handicapped toilet? No   Cognitive Function:      04/19/2016    9:30 AM  MMSE - Mini Mental State Exam  Orientation to time 5  Orientation to Place 5  Registration 3  Attention/ Calculation 4   Recall 3  Language- name 2 objects 2  Language- repeat 1  Language- follow 3 step command 3  Language- read & follow direction 1  Write a sentence 1  Copy design 1  Total score 29        03/27/2019   10:02 AM 03/16/2018   10:06 AM  6CIT Screen  What Year? 0 points 0 points  What month? 0 points 0 points  What time? 0 points 0 points  Count back from 20 0 points 0 points  Months in reverse 0 points 0 points  Repeat phrase 0 points 0 points  Total Score 0 points 0 points    Immunizations Immunization History  Administered Date(s) Administered   Fluad Quad(high Dose 65+) 12/28/2018, 12/25/2020   Influenza, High Dose Seasonal PF 12/24/2016, 12/08/2017   Influenza,inj,Quad PF,6+ Mos 12/19/2015, 01/18/2020   Influenza-Unspecified 03/06/2015   Moderna Sars-Covid-2 Vaccination 04/14/2019, 05/12/2019, 01/28/2020   Pneumococcal Conjugate-13 03/16/2018   Pneumococcal Polysaccharide-23 12/24/2016   Tdap 07/28/2010   Zoster Recombinat (Shingrix) 06/14/2020    TDAP status: Due, Education has been provided regarding the importance of this vaccine. Advised may receive this vaccine at local pharmacy or Health Dept. Aware to provide a copy of the vaccination record if obtained from local pharmacy or Health Dept. Verbalized acceptance and understanding.  Flu Vaccine status: Up to date  Pneumococcal vaccine status: Up to date  Covid-19 vaccine status: Completed vaccines  Qualifies for Shingles Vaccine? Yes   Zostavax completed No   Shingrix Completed?: No.    Education has been provided regarding the importance of this vaccine. Patient has been advised to call insurance company to determine out of pocket expense if they have not yet received this vaccine. Advised may also receive vaccine at local pharmacy or Health Dept. Verbalized acceptance and understanding.  Screening Tests Health Maintenance  Topic Date Due   COVID-19 Vaccine (4 - Booster for Moderna series) 03/24/2020    OPHTHALMOLOGY EXAM  06/27/2020   TETANUS/TDAP  07/27/2020   Zoster Vaccines- Shingrix (2 of 2) 08/09/2020   FOOT EXAM  10/26/2020   INFLUENZA VACCINE  10/27/2021   HEMOGLOBIN A1C  01/30/2022   MAMMOGRAM  12/26/2022   DEXA SCAN  05/11/2023   COLONOSCOPY (Pts 45-39yr Insurance coverage will need to be confirmed)  11/03/2023   Pneumonia Vaccine 70 Years old  Completed   Hepatitis C Screening  Completed   HPV VACCINES  Aged Out    Health Maintenance  Health Maintenance Due  Topic Date Due   COVID-19 Vaccine (4 - Booster for Moderna series) 03/24/2020   OPHTHALMOLOGY EXAM  06/27/2020   TETANUS/TDAP  07/27/2020   Zoster Vaccines- Shingrix (2 of 2) 08/09/2020   FOOT EXAM  10/26/2020    Colorectal cancer screening: Type of screening: Colonoscopy. Completed 11/03/18. Repeat every 5 years  Mammogram status: Completed 12/25/20. Repeat every year  Bone Density status: Completed 05/10/18. Results reflect: Bone density results: NORMAL. Repeat every 5 years.  Lung Cancer Screening: (Low Dose CT Chest recommended if Age 1-80 years, 30 pack-year currently smoking OR have quit w/in 15years.) does not qualify.   Additional Screening:  Hepatitis C Screening: does qualify; Completed 03/16/18  Vision Screening: Recommended annual ophthalmology exams for early detection of glaucoma and other disorders of the eye. Is the patient up to date with their annual eye exam?  Yes  Who is the provider or what is the name of the office in which the patient attends annual eye exams? [facility] If pt is not established with a provider, would they like to be referred to a provider to establish care? No .   Dental Screening: Recommended annual dental exams for proper oral hygiene  Community Resource Referral / Chronic Care Management: CRR required this visit?  No   CCM required this visit?  No      Plan:     I have personally reviewed and noted the following in the patient's chart:    Medical and social history Use of alcohol, tobacco or illicit drugs  Current medications and supplements including opioid prescriptions.  Functional ability and status Nutritional status Physical activity Advanced directives List of other physicians Hospitalizations, surgeries, and ER visits in previous 12 months Vitals Screenings to include cognitive, depression, and falls Referrals and appointments  In addition, I have reviewed and discussed with patient certain preventive protocols, quality metrics, and best practice recommendations. A written personalized care plan for preventive services as well as general preventive health recommendations were provided to patient.     Dionisio David, LPN   11/22/886   Nurse Notes: none

## 2021-08-11 ENCOUNTER — Emergency Department: Payer: 59

## 2021-08-11 ENCOUNTER — Ambulatory Visit: Payer: Self-pay | Admitting: *Deleted

## 2021-08-11 ENCOUNTER — Inpatient Hospital Stay
Admission: EM | Admit: 2021-08-11 | Discharge: 2021-08-22 | DRG: 871 | Disposition: A | Discharge status: Skilled Nursing Facility | Payer: 59 | Attending: Internal Medicine | Admitting: Internal Medicine

## 2021-08-11 ENCOUNTER — Inpatient Hospital Stay: Payer: 59

## 2021-08-11 ENCOUNTER — Other Ambulatory Visit: Payer: Self-pay

## 2021-08-11 DIAGNOSIS — I10 Essential (primary) hypertension: Secondary | ICD-10-CM | POA: Diagnosis present

## 2021-08-11 DIAGNOSIS — E669 Obesity, unspecified: Secondary | ICD-10-CM | POA: Diagnosis present

## 2021-08-11 DIAGNOSIS — R0902 Hypoxemia: Secondary | ICD-10-CM | POA: Diagnosis present

## 2021-08-11 DIAGNOSIS — I480 Paroxysmal atrial fibrillation: Secondary | ICD-10-CM | POA: Diagnosis present

## 2021-08-11 DIAGNOSIS — Z87898 Personal history of other specified conditions: Secondary | ICD-10-CM

## 2021-08-11 DIAGNOSIS — I951 Orthostatic hypotension: Secondary | ICD-10-CM

## 2021-08-11 DIAGNOSIS — Z87891 Personal history of nicotine dependence: Secondary | ICD-10-CM

## 2021-08-11 DIAGNOSIS — K219 Gastro-esophageal reflux disease without esophagitis: Secondary | ICD-10-CM | POA: Diagnosis present

## 2021-08-11 DIAGNOSIS — Z803 Family history of malignant neoplasm of breast: Secondary | ICD-10-CM

## 2021-08-11 DIAGNOSIS — I447 Left bundle-branch block, unspecified: Secondary | ICD-10-CM | POA: Diagnosis present

## 2021-08-11 DIAGNOSIS — U071 COVID-19: Secondary | ICD-10-CM | POA: Diagnosis present

## 2021-08-11 DIAGNOSIS — Z794 Long term (current) use of insulin: Secondary | ICD-10-CM

## 2021-08-11 DIAGNOSIS — E1169 Type 2 diabetes mellitus with other specified complication: Secondary | ICD-10-CM

## 2021-08-11 DIAGNOSIS — Z6832 Body mass index (BMI) 32.0-32.9, adult: Secondary | ICD-10-CM

## 2021-08-11 DIAGNOSIS — F32A Depression, unspecified: Secondary | ICD-10-CM | POA: Diagnosis present

## 2021-08-11 DIAGNOSIS — Z955 Presence of coronary angioplasty implant and graft: Secondary | ICD-10-CM

## 2021-08-11 DIAGNOSIS — Z800 Family history of malignant neoplasm of digestive organs: Secondary | ICD-10-CM

## 2021-08-11 DIAGNOSIS — Z7901 Long term (current) use of anticoagulants: Secondary | ICD-10-CM | POA: Diagnosis not present

## 2021-08-11 DIAGNOSIS — Z801 Family history of malignant neoplasm of trachea, bronchus and lung: Secondary | ICD-10-CM

## 2021-08-11 DIAGNOSIS — Z7902 Long term (current) use of antithrombotics/antiplatelets: Secondary | ICD-10-CM | POA: Diagnosis not present

## 2021-08-11 DIAGNOSIS — Z825 Family history of asthma and other chronic lower respiratory diseases: Secondary | ICD-10-CM

## 2021-08-11 DIAGNOSIS — E876 Hypokalemia: Secondary | ICD-10-CM | POA: Diagnosis not present

## 2021-08-11 DIAGNOSIS — E1142 Type 2 diabetes mellitus with diabetic polyneuropathy: Secondary | ICD-10-CM | POA: Diagnosis present

## 2021-08-11 DIAGNOSIS — R112 Nausea with vomiting, unspecified: Secondary | ICD-10-CM | POA: Diagnosis present

## 2021-08-11 DIAGNOSIS — I110 Hypertensive heart disease with heart failure: Secondary | ICD-10-CM | POA: Diagnosis present

## 2021-08-11 DIAGNOSIS — A4189 Other specified sepsis: Principal | ICD-10-CM | POA: Diagnosis present

## 2021-08-11 DIAGNOSIS — Z7984 Long term (current) use of oral hypoglycemic drugs: Secondary | ICD-10-CM

## 2021-08-11 DIAGNOSIS — E861 Hypovolemia: Secondary | ICD-10-CM | POA: Diagnosis present

## 2021-08-11 DIAGNOSIS — Z888 Allergy status to other drugs, medicaments and biological substances status: Secondary | ICD-10-CM

## 2021-08-11 DIAGNOSIS — I4891 Unspecified atrial fibrillation: Secondary | ICD-10-CM | POA: Diagnosis not present

## 2021-08-11 DIAGNOSIS — I9589 Other hypotension: Secondary | ICD-10-CM | POA: Diagnosis not present

## 2021-08-11 DIAGNOSIS — I2510 Atherosclerotic heart disease of native coronary artery without angina pectoris: Secondary | ICD-10-CM | POA: Diagnosis present

## 2021-08-11 DIAGNOSIS — I959 Hypotension, unspecified: Secondary | ICD-10-CM

## 2021-08-11 DIAGNOSIS — Z880 Allergy status to penicillin: Secondary | ICD-10-CM

## 2021-08-11 DIAGNOSIS — F419 Anxiety disorder, unspecified: Secondary | ICD-10-CM | POA: Diagnosis present

## 2021-08-11 DIAGNOSIS — R0789 Other chest pain: Secondary | ICD-10-CM | POA: Diagnosis present

## 2021-08-11 DIAGNOSIS — R531 Weakness: Secondary | ICD-10-CM | POA: Diagnosis present

## 2021-08-11 DIAGNOSIS — F418 Other specified anxiety disorders: Secondary | ICD-10-CM | POA: Diagnosis present

## 2021-08-11 DIAGNOSIS — I5032 Chronic diastolic (congestive) heart failure: Secondary | ICD-10-CM | POA: Diagnosis present

## 2021-08-11 DIAGNOSIS — R197 Diarrhea, unspecified: Secondary | ICD-10-CM | POA: Diagnosis present

## 2021-08-11 DIAGNOSIS — Z8673 Personal history of transient ischemic attack (TIA), and cerebral infarction without residual deficits: Secondary | ICD-10-CM

## 2021-08-11 DIAGNOSIS — R0600 Dyspnea, unspecified: Secondary | ICD-10-CM

## 2021-08-11 DIAGNOSIS — Z90710 Acquired absence of both cervix and uterus: Secondary | ICD-10-CM

## 2021-08-11 DIAGNOSIS — Z79899 Other long term (current) drug therapy: Secondary | ICD-10-CM

## 2021-08-11 DIAGNOSIS — Z8249 Family history of ischemic heart disease and other diseases of the circulatory system: Secondary | ICD-10-CM

## 2021-08-11 DIAGNOSIS — R7881 Bacteremia: Secondary | ICD-10-CM | POA: Clinically undetermined

## 2021-08-11 DIAGNOSIS — I252 Old myocardial infarction: Secondary | ICD-10-CM

## 2021-08-11 DIAGNOSIS — Z951 Presence of aortocoronary bypass graft: Secondary | ICD-10-CM

## 2021-08-11 LAB — BLOOD GAS, ARTERIAL
Acid-Base Excess: 2.8 mmol/L — ABNORMAL HIGH (ref 0.0–2.0)
Bicarbonate: 26.1 mmol/L (ref 20.0–28.0)
O2 Saturation: 97 %
Patient temperature: 37
pCO2 arterial: 35 mmHg (ref 32–48)
pH, Arterial: 7.48 — ABNORMAL HIGH (ref 7.35–7.45)
pO2, Arterial: 77 mmHg — ABNORMAL LOW (ref 83–108)

## 2021-08-11 LAB — CBC WITH DIFFERENTIAL/PLATELET
Abs Immature Granulocytes: 0.02 10*3/uL (ref 0.00–0.07)
Basophils Absolute: 0 10*3/uL (ref 0.0–0.1)
Basophils Relative: 0 %
Eosinophils Absolute: 0 10*3/uL (ref 0.0–0.5)
Eosinophils Relative: 0 %
HCT: 39.8 % (ref 36.0–46.0)
Hemoglobin: 12.7 g/dL (ref 12.0–15.0)
Immature Granulocytes: 0 %
Lymphocytes Relative: 18 %
Lymphs Abs: 1.2 10*3/uL (ref 0.7–4.0)
MCH: 30.8 pg (ref 26.0–34.0)
MCHC: 31.9 g/dL (ref 30.0–36.0)
MCV: 96.4 fL (ref 80.0–100.0)
Monocytes Absolute: 0.6 10*3/uL (ref 0.1–1.0)
Monocytes Relative: 9 %
Neutro Abs: 4.6 10*3/uL (ref 1.7–7.7)
Neutrophils Relative %: 73 %
Platelets: 158 10*3/uL (ref 150–400)
RBC: 4.13 MIL/uL (ref 3.87–5.11)
RDW: 13.4 % (ref 11.5–15.5)
WBC: 6.4 10*3/uL (ref 4.0–10.5)
nRBC: 0 % (ref 0.0–0.2)

## 2021-08-11 LAB — RESP PANEL BY RT-PCR (FLU A&B, COVID) ARPGX2
Influenza A by PCR: NEGATIVE
Influenza B by PCR: NEGATIVE
SARS Coronavirus 2 by RT PCR: POSITIVE — AB

## 2021-08-11 LAB — LACTIC ACID, PLASMA: Lactic Acid, Venous: 1.9 mmol/L (ref 0.5–1.9)

## 2021-08-11 LAB — COMPREHENSIVE METABOLIC PANEL
ALT: 17 U/L (ref 0–44)
AST: 30 U/L (ref 15–41)
Albumin: 3.6 g/dL (ref 3.5–5.0)
Alkaline Phosphatase: 67 U/L (ref 38–126)
Anion gap: 12 (ref 5–15)
BUN: 15 mg/dL (ref 8–23)
CO2: 24 mmol/L (ref 22–32)
Calcium: 9 mg/dL (ref 8.9–10.3)
Chloride: 105 mmol/L (ref 98–111)
Creatinine, Ser: 0.7 mg/dL (ref 0.44–1.00)
GFR, Estimated: >60 mL/min (ref >60)
Glucose, Bld: 124 mg/dL — ABNORMAL HIGH (ref 70–99)
Potassium: 3.3 mmol/L — ABNORMAL LOW (ref 3.5–5.1)
Sodium: 141 mmol/L (ref 135–145)
Total Bilirubin: 0.7 mg/dL (ref 0.3–1.2)
Total Protein: 6.8 g/dL (ref 6.5–8.1)

## 2021-08-11 LAB — GLUCOSE, CAPILLARY
Glucose-Capillary: 127 mg/dL — ABNORMAL HIGH (ref 70–99)
Glucose-Capillary: 133 mg/dL — ABNORMAL HIGH (ref 70–99)

## 2021-08-11 LAB — D-DIMER, QUANTITATIVE: D-Dimer, Quant: 0.98 ug/mL-FEU — ABNORMAL HIGH (ref 0.00–0.50)

## 2021-08-11 LAB — TROPONIN I (HIGH SENSITIVITY)
Troponin I (High Sensitivity): 17 ng/L (ref <18)
Troponin I (High Sensitivity): 19 ng/L — ABNORMAL HIGH (ref <18)

## 2021-08-11 LAB — BRAIN NATRIURETIC PEPTIDE: B Natriuretic Peptide: 70.4 pg/mL (ref 0.0–100.0)

## 2021-08-11 MED ORDER — ALBUTEROL SULFATE HFA 108 (90 BASE) MCG/ACT IN AERS: 2.0000 | INHALATION_SPRAY | Freq: Four times a day (QID) | RESPIRATORY_TRACT | Status: DC | PRN

## 2021-08-11 MED ORDER — CLOPIDOGREL BISULFATE 75 MG PO TABS
75.0000 mg | ORAL_TABLET | Freq: Every day | ORAL | Status: DC
  Administered 2021-08-11 – 2021-08-22 (×12): 75 mg via ORAL
  Filled 2021-08-11 (×12): qty 1

## 2021-08-11 MED ORDER — APIXABAN 5 MG PO TABS: 5.0000 mg | ORAL_TABLET | Freq: Two times a day (BID) | ORAL | Status: DC

## 2021-08-11 MED ORDER — SERTRALINE HCL 50 MG PO TABS
100.0000 mg | ORAL_TABLET | Freq: Every day | ORAL | Status: DC
  Administered 2021-08-11 – 2021-08-22 (×12): 100 mg via ORAL
  Filled 2021-08-11 (×12): qty 2

## 2021-08-11 MED ORDER — ENOXAPARIN SODIUM 80 MG/0.8ML IJ SOSY
1.0000 mg/kg | PREFILLED_SYRINGE | Freq: Two times a day (BID) | INTRAMUSCULAR | Status: DC
  Administered 2021-08-11 – 2021-08-14 (×6): 75 mg via SUBCUTANEOUS
  Filled 2021-08-11 (×6): qty 0.75

## 2021-08-11 MED ORDER — IOHEXOL 350 MG/ML SOLN
75.0000 mL | Freq: Once | INTRAVENOUS | Status: AC | PRN
  Administered 2021-08-11: 75 mL via INTRAVENOUS

## 2021-08-11 MED ORDER — INSULIN GLARGINE-YFGN 100 UNIT/ML ~~LOC~~ SOLN
10.0000 [IU] | Freq: Every day | SUBCUTANEOUS | Status: DC
  Administered 2021-08-11 – 2021-08-21 (×11): 10 [IU] via SUBCUTANEOUS
  Filled 2021-08-11 (×12): qty 0.1

## 2021-08-11 MED ORDER — ZINC SULFATE 220 (50 ZN) MG PO CAPS
220.0000 mg | ORAL_CAPSULE | Freq: Every day | ORAL | Status: DC
  Administered 2021-08-11 – 2021-08-22 (×12): 220 mg via ORAL
  Filled 2021-08-11 (×13): qty 1

## 2021-08-11 MED ORDER — ACETAMINOPHEN 325 MG PO TABS
650.0000 mg | ORAL_TABLET | Freq: Four times a day (QID) | ORAL | Status: DC | PRN
  Administered 2021-08-11 – 2021-08-21 (×8): 650 mg via ORAL
  Filled 2021-08-11 (×9): qty 2

## 2021-08-11 MED ORDER — ONDANSETRON HCL 4 MG PO TABS
4.0000 mg | ORAL_TABLET | Freq: Four times a day (QID) | ORAL | Status: DC | PRN
  Administered 2021-08-12 – 2021-08-17 (×3): 4 mg via ORAL
  Filled 2021-08-11 (×3): qty 1

## 2021-08-11 MED ORDER — ASCORBIC ACID 500 MG PO TABS
500.0000 mg | ORAL_TABLET | Freq: Every day | ORAL | Status: DC
  Administered 2021-08-11 – 2021-08-22 (×12): 500 mg via ORAL
  Filled 2021-08-11 (×12): qty 1

## 2021-08-11 MED ORDER — INSULIN ASPART 100 UNIT/ML IJ SOLN
0.0000 [IU] | Freq: Three times a day (TID) | INTRAMUSCULAR | Status: DC
  Administered 2021-08-13: 3 [IU] via SUBCUTANEOUS
  Administered 2021-08-13: 2 [IU] via SUBCUTANEOUS
  Administered 2021-08-14 – 2021-08-17 (×6): 3 [IU] via SUBCUTANEOUS
  Administered 2021-08-18: 2 [IU] via SUBCUTANEOUS
  Administered 2021-08-18 – 2021-08-19 (×5): 3 [IU] via SUBCUTANEOUS
  Administered 2021-08-20: 5 [IU] via SUBCUTANEOUS
  Administered 2021-08-20: 3 [IU] via SUBCUTANEOUS
  Administered 2021-08-21: 2 [IU] via SUBCUTANEOUS
  Administered 2021-08-21: 5 [IU] via SUBCUTANEOUS
  Administered 2021-08-21: 2 [IU] via SUBCUTANEOUS
  Administered 2021-08-22: 3 [IU] via SUBCUTANEOUS
  Administered 2021-08-22: 2 [IU] via SUBCUTANEOUS
  Filled 2021-08-11 (×21): qty 1

## 2021-08-11 MED ORDER — SODIUM CHLORIDE 0.9 % IV SOLN: INTRAVENOUS | Status: AC

## 2021-08-11 MED ORDER — GUAIFENESIN-DM 100-10 MG/5ML PO SYRP
10.0000 mL | ORAL_SOLUTION | ORAL | Status: DC | PRN
  Administered 2021-08-12 – 2021-08-22 (×16): 10 mL via ORAL
  Filled 2021-08-11 (×17): qty 10

## 2021-08-11 MED ORDER — NIRMATRELVIR/RITONAVIR (PAXLOVID)TABLET
3.0000 | ORAL_TABLET | Freq: Two times a day (BID) | ORAL | Status: AC
  Administered 2021-08-11 – 2021-08-16 (×10): 3 via ORAL
  Filled 2021-08-11: qty 30

## 2021-08-11 MED ORDER — PANTOPRAZOLE SODIUM 20 MG PO TBEC
20.0000 mg | DELAYED_RELEASE_TABLET | Freq: Two times a day (BID) | ORAL | Status: DC
  Administered 2021-08-11 – 2021-08-22 (×20): 20 mg via ORAL
  Filled 2021-08-11 (×23): qty 1

## 2021-08-11 MED ORDER — SODIUM CHLORIDE 0.9 % IV BOLUS
250.0000 mL | Freq: Once | INTRAVENOUS | Status: AC
  Administered 2021-08-11: 250 mL via INTRAVENOUS

## 2021-08-11 MED ORDER — EZETIMIBE 10 MG PO TABS
10.0000 mg | ORAL_TABLET | Freq: Every day | ORAL | Status: DC
  Administered 2021-08-11 – 2021-08-22 (×12): 10 mg via ORAL
  Filled 2021-08-11 (×12): qty 1

## 2021-08-11 MED ORDER — NITROGLYCERIN 0.4 MG SL SUBL: 0.4000 mg | SUBLINGUAL_TABLET | SUBLINGUAL | Status: DC | PRN

## 2021-08-11 MED ORDER — ONDANSETRON HCL 4 MG/2ML IJ SOLN
4.0000 mg | Freq: Four times a day (QID) | INTRAMUSCULAR | Status: DC | PRN
  Administered 2021-08-11: 4 mg via INTRAVENOUS
  Filled 2021-08-11: qty 2

## 2021-08-11 MED ORDER — ALPRAZOLAM 0.25 MG PO TABS
0.2500 mg | ORAL_TABLET | Freq: Three times a day (TID) | ORAL | Status: DC | PRN
  Administered 2021-08-11 – 2021-08-20 (×9): 0.25 mg via ORAL
  Filled 2021-08-11 (×9): qty 1

## 2021-08-11 NOTE — Assessment & Plan Note (Addendum)
On reduced dose of basal insulin, 10 units at bedtime.  Sliding scale NovoLog.  Adjust insulin for goal 140-180.  CBGs been at goal

## 2021-08-11 NOTE — ED Triage Notes (Signed)
Pt to ED from home with SOB onset yesterday and CP onset Sunday. Pt tested positive for COVID yesterday. Pt hypotensive on medic arrival and given fluid bolus PTA at the hospital. Given 324 ASA and 650 tylenol by medic. ?

## 2021-08-11 NOTE — Telephone Encounter (Signed)
?  Chief Complaint: Covid pos ?Symptoms:  ?Frequency:  ?Pertinent Negatives: Patient denies  ?Disposition: '[]'$ ED /'[]'$ Urgent Care (no appt availability in office) / '[]'$ Appointment(In office/virtual)/ '[]'$  [facility]/ '[]'$ Home Care/ '[]'$ Refused Recommended Disposition /'[]'$ [place] [facility]/ '[]'$  Follow-up with PCP ?Additional Notes:  ? ?Called to triage pt, daughter answered. States EMS at home, pt to be transported to hospital,SOB. Assured NT would alert PCP. ? ?Reason for Disposition ? Patient already left for the hospital/clinic. ?   EMS at home, transporting to hospital ? ?Protocols used: No Contact or Duplicate Contact Call-A-AH ? ?

## 2021-08-11 NOTE — H&P (Addendum)
History and Physical    Patient: Candace [name] DOB: 1951/03/14 DOA: 08/11/2021 DOS: the patient was seen and examined on 08/11/2021 PCP: Maple Hudson., MD  Patient coming from: Home  Chief Complaint:  Chief Complaint  Patient presents with   Shortness of Breath   HPI: Candace [name] is a 71 y.o. female with medical history significant for coronary artery disease status post CABG, history of unstable angina, anxiety disorder, chronic diastolic dysfunction CHF, paroxysmal A-fib, hypertension, depression and anxiety as well as diabetes mellitus who presents to the ER for evaluation of feeling unwell. Patient states that her symptoms started 3 days prior to her hospitalization.  She states that she developed profuse watery diarrhea associated with poor oral intake and then the next day she became very weak and unable to get around the house.  She complains of myalgias, headache, cough productive of yellow phlegm as well as chills.  She also complains of chest pain which she thought was her usual angina.  Chest pain was mostly midsternal and worse with inspiration.  She denied having any nausea, no vomiting, no diaphoresis, no palpitations, no leg swelling, focal deficit or blurred vision. She tested positive for the COVID-19 virus using a home test on 08/10/21 and decided to come to the ER for further evaluation due to feeling very weak. Per EMS she was hypotensive and received a fluid bolus in the field prior to  her arriving the ER. She was also noted to be tachycardic when she arrived to ER. Review of Systems: As mentioned in the history of present illness. All other systems reviewed and are negative. Past Medical History:  Diagnosis Date   Allergy    Anemia    Anxiety    Arrhythmia    Arthritis    Atrial fibrillation (HCC)    CHF (congestive heart failure) (HCC)    COPD (chronic obstructive pulmonary disease) (HCC)    Coronary artery disease    Depression     Diabetes mellitus without complication (HCC)    Dyspnea    doe   Dysrhythmia    GERD (gastroesophageal reflux disease)    Headache    History of hiatal hernia    Hyperlipidemia    Hypertension    Myocardial infarction (HCC)    2016, 04/2017   Myocardial infarction with cardiac rehabilitation [facility])    MI 2016/ CABG 8/17    FINISHED CARDIAC REHAB 3 WEEKS AGO   Panic attack    Pneumonia    Reflux    Stroke (HCC) 2015   showed up on MRI; no weakness noted   TIA (transient ischemic attack)    Voice tremor    Past Surgical History:  Procedure Laterality Date   ABDOMINAL HYSTERECTOMY     APPENDECTOMY  1975   ARTERY BIOPSY Right 04/26/2016   Procedure: BIOPSY TEMPORAL ARTERY;  Surgeon: Vernie Murders, MD;  Location: [facility] ORS;  Service: ENT;  Laterality: Right;   CARDIAC CATHETERIZATION N/A 11/06/2015   Procedure: Left Heart Cath and Coronary Angiography;  Surgeon: Lamar Blinks, MD;  Location: [facility] INVASIVE CV LAB;  Service: Cardiovascular;  Laterality: N/A;   CESAREAN SECTION     COLONOSCOPY  2015   COLONOSCOPY WITH PROPOFOL N/A 11/03/2018   Procedure: COLONOSCOPY WITH PROPOFOL;  Surgeon: Pasty Spillers, MD;  Location: [facility] ENDOSCOPY;  Service: Endoscopy;  Laterality: N/A;   CORONARY ANGIOPLASTY  04/2017   [facility]   CORONARY ARTERY BYPASS GRAFT N/A 11/10/2015  Procedure: CORONARY ARTERY BYPASS GRAFTING (CABG), ON PUMP, TIMES FOUR, USING LEFT INTERNAL MAMMARY ARTERY, BILATERAL GREATER SAPHENOUS VEINS HARVESTED ENDOSCOPICALLY;  Surgeon: Delight Ovens, MD;  Location: [facility] OR;  Service: Open Heart Surgery;  Laterality: N/A;  LIMA-LAD; SEQ SVG-OM1-OM2; SVG-PL   CORONARY STENT INTERVENTION N/A 08/05/2016   Procedure: Coronary Stent Intervention;  Surgeon: Marcina Millard, MD;  Location: [facility] INVASIVE CV LAB;  Service: Cardiovascular;  Laterality: N/A;   ESOPHAGOGASTRODUODENOSCOPY (EGD) WITH PROPOFOL N/A 11/03/2018   Procedure: ESOPHAGOGASTRODUODENOSCOPY (EGD) WITH  PROPOFOL;  Surgeon: Pasty Spillers, MD;  Location: [facility] ENDOSCOPY;  Service: Endoscopy;  Laterality: N/A;   HYSTERECTOMY ABDOMINAL WITH SALPINGO-OOPHORECTOMY Bilateral 08/15/2017   Procedure: HYSTERECTOMY ABDOMINAL WITH BILATERAL SALPINGO-OOPHORECTOMY;  Surgeon: Hildred Laser, MD;  Location: [facility] ORS;  Service: Gynecology;  Laterality: Bilateral;   LEFT HEART CATH AND CORONARY ANGIOGRAPHY N/A 08/05/2016   Procedure: Left Heart Cath and Coronary Angiography;  Surgeon: Marcina Millard, MD;  Location: [facility] INV[facility] CV LAB;  Service: Cardiovascular;  Laterality: N/A;   LEFT HEART CATH AND CORS/GRAFTS ANGIOGRAPHY N/A 09/19/2018   Procedure: LEFT HEART CATH AND CORS/GRAFTS ANGIOGRAPHY;  Surgeon: Lamar Blinks, MD;  Location: [facility] CV LAB;  Service: Cardiovascular;  Laterality: N/A;   LEFT HEART CATH AND CORS/GRAFTS ANGIOGRAPHY N/A 08/30/2019   Procedure: LEFT HEART CATH AND CORS/GRAFTS ANGIOGRAPHY;  Surgeon: Dalia Heading, MD;  Location: [facility] INVAS[facility] CV LAB;  Service: Cardiovascular;  Laterality: N/A;   OOPHORECTOMY     TEE WITHOUT CARDIOVERSION N/A 11/10/2015   Procedure: TRANSESOPHAGEAL ECHOCARDIOGRAM (TEE);  Surgeon: Delight Ovens, MD;  Location[facility] OR;  Service: Open Heart Surgery;  Laterality: N/A;   TUBAL LIGATION     Social History:  reports that she quit smoking about 19 years ago. Her smoking use included cigarettes. She smoked an average of .5 packs per day. She has never used smokeless tobacco. She reports that she does not drink alcohol and does not use drugs.  Allergies  Allergen Reactions   Tramadol Other (See Comments)    Causes patient to be off balance and Mental Changes   Lisinopril Cough   Penicillins Swelling, Rash and Other (See Comments)    Did it involve swelling of the face/tongue/throat, SOB, or low BP? Yes Did it involve sudden or severe rash/hives, skin peeling, or any reaction on the inside of your mouth or nose? Yes Did you need to seek medical  attention at a hospital or doctor's office? Yes When did it last happen?      15 years If all above answers are "NO", may proceed with cephalosporin use.     Family History  Problem Relation Age of Onset   Cancer Father    Hypertension Father    Heart disease Father    Asthma Father    Cancer Mother    Hypertension Mother    Pancreatic cancer Mother 54   Cancer Sister    Breast cancer Sister 53   Breast cancer Sister 67   Lung cancer Brother    Pancreatic cancer Sister 33   Cancer Sister     Prior to Admission medications   Medication Sig Start Date End Date Taking? Authorizing Provider  acetaminophen (TYLENOL) 325 MG tablet Take 650 mg by mouth every 6 (six) hours as needed for moderate pain or headache.     [provider]  albuterol (VENTOLIN HFA) 108 (90 Base) MCG/ACT inhaler Inhale 2 puffs into the lungs every 6 (six) hours as needed for wheezing or  shortness of breath. 01/07/21   Maple Hudson., MD  ALPRAZolam Prudy Feeler) 0.25 MG tablet Take 1 tablet (0.25 mg total) by mouth every 8 (eight) hours as needed for anxiety. 07/30/21   Maple Hudson., MD  bismuth subsalicylate (PEPTO BISMOL) 262 MG chewable tablet Chew 524 mg by mouth daily as needed for diarrhea or loose stools or indigestion. Patient not taking: Reported on 07/20/2021    [provider]  Blood Glucose Monitoring Suppl (ONETOUCH VERIO) w/Device KIT Use daily to check blood sugar 03/05/21   Erasmo Downer, MD  clopidogrel (PLAVIX) 75 MG tablet Take 1 tablet (75 mg total) by mouth daily. 09/05/20   Maple Hudson., MD  Dulaglutide (TRULICITY) 1.5 MG/0.5ML SOPN Inject 1.5 mg into the skin once a week. 06/30/21   Drubel, Lillia Abed, PA-C  ELIQUIS 5 MG TABS tablet Take 5 mg by mouth 2 (two) times daily. 03/26/20   [provider]  empagliflozin (JARDIANCE) 25 MG TABS tablet Take 1 tablet (25 mg total) by mouth daily. 06/30/21   Alfredia Ferguson, PA-C  ezetimibe (ZETIA) 10 MG  tablet Take 1 tablet (10 mg total) by mouth daily. 09/05/20   Maple Hudson., MD  furosemide (LASIX) 20 MG tablet Take 20 mg by mouth daily. Patient not taking: Reported on 08/10/2021    [provider]  glucose blood test strip Use to check blood sugars daily as instructed 12/23/20   Maple Hudson., MD  insulin glargine (LANTUS SOLOSTAR) 100 UNIT/ML Solostar Pen Inject 18 Units into the skin at bedtime. 06/30/21   Alfredia Ferguson, PA-C  Insulin Pen Needle 32G X 4 MM MISC Use to inject insulin daily 12/23/20   Maple Hudson., MD  isosorbide mononitrate (IMDUR) 120 MG 24 hr tablet Take 1 tablet (120 mg total) by mouth daily. 07/08/21   Maple Hudson., MD  Lancets [facility] DELICA PLUS [place]) MISC USE UP TO 4 TIMES DAILY AS DIRECTED 07/10/19   Maple Hudson., MD  loperamide (IMODIUM) 2 MG capsule Take 2 mg by mouth daily as needed for diarrhea or loose stools.    [provider]  metoprolol tartrate (LOPRESSOR) 100 MG tablet Take 100 mg by mouth 2 (two) times daily. 10/03/18   [provider]  nitroGLYCERIN (NITROSTAT) 0.4 MG SL tablet Place 1 tablet (0.4 mg total) under the tongue every 5 (five) minutes as needed for chest pain. 07/27/16   Adrian Saran, MD  nystatin (NYSTATIN) powder Apply 1 application topically daily. 02/14/20   Hildred Laser, MD  ondansetron (ZOFRAN-ODT) 8 MG disintegrating tablet Take 1 tablet (8 mg total) by mouth every 8 (eight) hours as needed for refractory nausea / vomiting. 06/30/21   Alfredia Ferguson, PA-C  pantoprazole (PROTONIX) 20 MG tablet Take 1 tablet (20 mg total) by mouth 2 (two) times daily. 05/15/21   Maple Hudson., MD  rosuvastatin (CRESTOR) 40 MG tablet Take 1 tablet (40 mg total) by mouth daily. 05/15/21   Maple Hudson., MD  sacubitril-valsartan (ENTRESTO) 24-26 MG Take 1 tablet by mouth 2 (two) times daily. 06/23/21   Delma Freeze, FNP  sertraline (ZOLOFT) 100 MG tablet Take 1 tablet (100  mg total) by mouth daily. 05/15/21   Maple Hudson., MD  cetirizine (ZYRTEC) 5 MG tablet Take 2 tablets (10 mg total) by mouth daily. 12/19/18 02/06/20  Maple Hudson., MD    Physical Exam: Vitals:   08/11/21  1430 08/11/21 1445 08/11/21 1540 08/11/21 1630  BP: (!) 105/52  (!) 114/59 126/61  Pulse: 96  (!) 105 96  Resp: 20  19 13   Temp:      TempSrc:      SpO2: 93% 95% 99% 97%  Weight:      Height:       Physical Exam Vitals and nursing note reviewed.  Constitutional:      Appearance: She is well-developed.  HENT:     Head: Normocephalic and atraumatic.     Mouth/Throat:     Mouth: Mucous membranes are moist.  Eyes:     Pupils: Pupils are equal, round, and reactive to light.  Cardiovascular:     Rate and Rhythm: Tachycardia present.  Pulmonary:     Effort: Pulmonary effort is normal.     Breath sounds: Examination of the right-lower field reveals rales. Examination of the left-lower field reveals rales. Rales present.  Abdominal:     General: Bowel sounds are normal.     Palpations: Abdomen is soft.  Musculoskeletal:        General: Normal range of motion.     Cervical back: Normal range of motion and neck supple.  Skin:    General: Skin is warm and dry.  Neurological:     Mental Status: She is alert.     Motor: Weakness present.  Psychiatric:        Mood and Affect: Mood normal.        Behavior: Behavior normal.    Data Reviewed: Relevant notes from primary care and specialist visits, past discharge summaries as available in EHR, including Care Everywhere. Prior diagnostic testing as pertinent to current admission diagnoses Updated medications and problem lists for reconciliation ED course, including vitals, labs, imaging, treatment and response to treatment Triage notes, nursing and pharmacy notes and ED provider's notes Notable results as noted in HPI Labs reviewed.  Troponin 17 >> 19, lactic acid 1.9, BNP 70.4, white count 6.4, hemoglobin 12.7,  hematocrit 39.8, platelet 158.  BMP pending at the time of this H&P COVID-19 PCR test is positive Chest x-ray reviewed by me shows no evidence of acute cardiopulmonary disease There are no new results to review at this time.  Assessment and Plan: * COVID-19 Patient presents for evaluation of diarrhea, myalgias, cough productive of yellow phlegm and chills. She was hypotensive and tachycardic when she arrived to ER and responded to IV fluid resuscitation with improvement in her blood pressure. Patient's COVID-19 PCR is positive She is vaccinated against the COVID-19 virus Will start patient on Paxlovid Supportive care with antitussives, bronchodilator therapy and vitamins She is not hypoxic and does not require systemic steroids at this time   Type 2 diabetes mellitus with diabetic polyneuropathy, with long-term current use of insulin (HCC) Continue consistent carbohydrate diet Continue long-acting insulin but decrease dose to 10 units at bedtime Glycemic control with sliding scale insulin  Chronic diastolic CHF (congestive heart failure) (HCC) Stable and not acutely exacerbated Last known 2D echocardiogram is 55 to 60% from 04/23 Hold Furosemide, Entresto, metoprolol and nitrates due to relative hypotension.   Essential hypertension Hold metoprolol and nitrates due to relative hypotension  Paroxysmal atrial fibrillation (HCC) Hold metoprolol due to relative hypotension Continue Eliquis as primary prophylaxis for an acute stroke  Depression with anxiety Stable Continue sertraline and alprazolam  Hypotension Most likely secondary to GI losses from nausea, vomiting and diarrhea Patient's blood pressure was in the low 80s and she responded to  IV fluid bolus. Judicious IV fluid hydration Monitor respiratory status closely      Advance Care Planning:   Code Status: Full Code   Consults: None  Family Communication: Greater than 50% of time was spent discussing patient's  condition and plan of care with her at the bedside.  All questions and concerns have been addressed.  She verbalizes understanding and agrees with the plan.  Severity of Illness: the appropriate patient status for this patient is INPATIENT. Inpatient status is judged to be reasonable and necessary in order to provide the required intensity of service to ensure the patient's safety. The patient's presenting symptoms, physical exam findings, and initial radiographic and laboratory data in the context of their chronic comorbidities is felt to place them at high risk for further clinical deterioration. Furthermore, it is not anticipated that the patient will be medically stable for discharge from the hospital within 2 midnights of admission.   * I certify that at the point of admission it is my clinical judgment that the patient will require inpatient hospital care spanning beyond 2 midnights from the point of admission due to high intensity of service, high risk for further deterioration and high frequency of surveillance required.*  Author: Lucile Shutters, MD 08/11/2021 5:24 PM  For on call review [email or web address].

## 2021-08-11 NOTE — Assessment & Plan Note (Addendum)
Continue Zoloft and Xanax PRN

## 2021-08-11 NOTE — Assessment & Plan Note (Addendum)
Presented with complaints of profound generalized weakness, diarrhea, myalgias, sore throat, headache, cough productive of yellow phlegm and chills. Tested positive for COVID-19 at home on 5/15, COVID-19 PCR here positive as well.  Patient reports did receive COVID vaccines. Sepsis due to COVID-19-on arrival patient hypotensive and tachycardic. -- Completed Paxlovid -- Steroids deferred given no hypoxia -- Supportive care with bronchodilators, antitussives, antipyretics as needed, vitamins, pulmonary hygiene -- Supplemental oxygen if needed to maintain sats above 90%

## 2021-08-11 NOTE — Assessment & Plan Note (Addendum)
Patient is overall euvolemic and well compensated.  Last echo from April 2023 showed EF 55 to 58%, grade 1 diastolic dysfunction. --Due to hypotension on admission, Lasix, Entresto, metoprolol and nitrates were held. --Metoprolol resumed after addition of midodrine -- Monitor volume status closely, daily weights

## 2021-08-11 NOTE — Assessment & Plan Note (Addendum)
Overall rate controlled, tachycardia secondary to COVID infection. -- Metoprolol has been on hold due to hypotension and orthostatic symptoms.   --Started on midodrine and metoprolol has been resumed --Continue Eliquis  -- Advise close cardiology follow-up after discharge

## 2021-08-11 NOTE — ED Provider Notes (Signed)
? ?[facility] ?Provider Note ? ? ? Event Date/Time  ? First MD Initiated Contact with Patient 08/11/21 1240   ?  (approximate) ? ? ?History  ? ?Shortness of Breath ? ? ?HPI ? ?Candace [name] is a 71 y.o. female who got short of breath yesterday, Monday, and developed chest pain on Sunday.  Pain chest is apparently been constant.  It does not seem to be made worse with deep breathing or exertion.  Chest hurts in the middle of her chest.  She was tested and positive for COVID yesterday.  EMS called and relayed history that she was hypotensive.  She got fluid bolus 250 cc and improved.  Hypotension from 18-2 01 systolic.  She had a heart rate-year-old of 115 with left bundle branch block and ST segment in downsloping in 2 3 and F and flipped T's in 1 and L V5 V6 and left bundle branch.  These changes appear to be rate related most of them were present on previous EKG the T wave ST segment downsloping in 3.  The chest pain however does not sound from what I can discover, cardiac ? ?  ? ? ?Physical Exam  ? ?Triage Vital Signs: ?ED Triage Vitals  ?Enc Vitals Group  ?   BP 08/11/21 1239 101/72  ?   Pulse Rate 08/11/21 1239 (!) 107  ?   Resp 08/11/21 1239 18  ?   Temp 08/11/21 1239 98.9 ?F (37.2 ?C)  ?   Temp Source 08/11/21 1239 Oral  ?   SpO2 08/11/21 1237 98 %  ?   Weight 08/11/21 1238 165 lb (74.8 kg)  ?   Height 08/11/21 1238 '5\' 2"'$  (1.575 m)  ?   Head Circumference --   ?   Peak Flow --   ?   Pain Score 08/11/21 1237 7  ?   Pain Loc --   ?   Pain Edu? --   ?   Excl. in [employer]? --   ? ? ?Most recent vital signs: ?Vitals:  ? 08/11/21 1430 08/11/21 1445  ?BP: (!) 105/52   ?Pulse: 96   ?Resp: 20   ?Temp:    ?SpO2: 93% 95%  ? ? ? ?General: Awake, no distress.  ?CV:  Good peripheral perfusion.  Heart regular rate and rhythm no audible murmur ?Resp:  Normal effort.  Lungs are clear ?Abd:  No distention.  Soft and nontender ?Trace edema ? ? ?ED Results / Procedures / Treatments  ? ?Labs ?(all labs ordered  are listed, but only abnormal results are displayed) ?Labs Reviewed  ?RESP PANEL BY RT-PCR (FLU A&B, COVID) ARPGX2 - Abnormal; Notable for the following components:  ?    Result Value  ? SARS Coronavirus 2 by RT PCR POSITIVE (*)   ? All other components within normal limits  ?D-DIMER, QUANTITATIVE - Abnormal; Notable for the following components:  ? D-Dimer, Quant 0.98 (*)   ? All other components within normal limits  ?BLOOD GAS, ARTERIAL - Abnormal; Notable for the following components:  ? pH, Arterial 7.48 (*)   ? pO2, Arterial 77 (*)   ? Acid-Base Excess 2.8 (*)   ? All other components within normal limits  ?CULTURE, BLOOD (ROUTINE X 2)  ?CULTURE, BLOOD (ROUTINE X 2)  ?BRAIN NATRIURETIC PEPTIDE  ?CBC WITH DIFFERENTIAL/PLATELET  ?LACTIC ACID, PLASMA  ?LACTIC ACID, PLASMA  ?COMPREHENSIVE METABOLIC PANEL  ?TROPONIN I (HIGH SENSITIVITY)  ?TROPONIN I (HIGH SENSITIVITY)  ? ? ? ?EKG ? ?  EKG #1 done in the field by EMS read interpreted by me shows sinus tachycardia 115 normal axis flipped T waves in 1 and lead II and V5 and 6 with downsloping ST segments and some ST segment depression actually in lead II down downsloping ST segment in 3 and F ?EKG #2 read and interpreted by me shows sinus tach at about possibly 107 again resolution of his ST segment downsloping in 3 and F improvement in lead II ?EKG #3 read interpreted by me shows sinus tachycardia 106 normal axis looks similar to EKG #2 this looks similar to EKG from April 19. ?Patient's chest pain does not appear to have changed with all these EKGs. ?RADIOLOGY ? ?Chest x-ray reviewed by me looks similar to prior chest x-ray no obvious infiltrate ? ?PROCEDURES: ? ?Critical Care performed:  ? ?Procedures ? ? ?MEDICATIONS ORDERED IN ED: ?Medications  ?sodium chloride 0.9 % bolus 250 mL (250 mLs Intravenous New Bag/Given 08/11/21 1422)  ? ? ? ?IMPRESSION / MDM / ASSESSMENT AND PLAN / ED COURSE  ?I reviewed the triage vital signs and the nursing notes. ?Patient with chest  pain.  She had some dynamic EKG changes that I believe was rate related.  Initial troponin was negative.  Chest pain predated his shortness of breath and other symptoms by the day.  Chest pain seems to be constant.  It does not seem like this is cardiac although we will get the second troponin.  This is especially important since she had changes in her EKG and her heart rate slowed down. ?Reassured she did not have an NSTEMI or some other sort of cardiac ischemia. ? ?The patient is on the cardiac monitor to evaluate for evidence of arrhythmia and/or significant heart rate changes.  None have been seen. ? ?Additionally patient was hypotensive this resolved with fluids.  She still however has a much lower blood pressure than she should.  Her age.  She is not running a fever.  She was hypoxic in the field but not here.  I believe that it would be best to admit this lady especially since she is very weak as well.  Her CT angio is still pending.  I will sign her out to the oncoming physician. ? ?  ? ? ?FINAL CLINICAL IMPRESSION(S) / ED DIAGNOSES  ? ?Final diagnoses:  ?COVID  ?Dyspnea, unspecified type  ?Hypotension, unspecified hypotension type  ?Hypoxia  ? ? ? ?Rx / DC Orders  ? ?ED Discharge Orders   ? ? None  ? ?  ? ? ? ?Note:  This document was prepared using Dragon voice recognition software and may include unintentional dictation errors. ?  ?Nena Polio, MD ?08/11/21 1515 ? ?

## 2021-08-11 NOTE — Assessment & Plan Note (Addendum)
Antihypertensives are on hold due to soft BPs.  Resume when indicated. -- Metoprolol has been resumed -- Patient has been started on midodrine for orthostatic hypotension and soft resting BP

## 2021-08-11 NOTE — Progress Notes (Signed)
Pt arrived to room 125 from the ED. Received report from [place], Therapist, sports. See assessment. Will continue to monitor.  ?

## 2021-08-11 NOTE — Assessment & Plan Note (Addendum)
Initially, baseline BPs were low most likely secondary to hypovolemia in the setting of nausea, vomiting, diarrhea and poor p.o. intake.  On arrival, systolic BP was low 38B, responded well to IV fluid bolus.  5/19-20: Patient resting BPs have improved but somewhat soft.  Does have symptomatic orthostatic hypotension, unable to tolerate standing due to dizziness  --Started midodrine 5 mg twice daily with meals for now --Daily orthostatic vitals -- Maintain MAP above 65 with fluids if needed -- Monitor volume status closely if requiring fluids

## 2021-08-12 DIAGNOSIS — U071 COVID-19: Secondary | ICD-10-CM | POA: Diagnosis not present

## 2021-08-12 DIAGNOSIS — I5032 Chronic diastolic (congestive) heart failure: Secondary | ICD-10-CM

## 2021-08-12 DIAGNOSIS — Z794 Long term (current) use of insulin: Secondary | ICD-10-CM

## 2021-08-12 DIAGNOSIS — E861 Hypovolemia: Secondary | ICD-10-CM

## 2021-08-12 DIAGNOSIS — E1142 Type 2 diabetes mellitus with diabetic polyneuropathy: Secondary | ICD-10-CM | POA: Diagnosis not present

## 2021-08-12 DIAGNOSIS — E876 Hypokalemia: Secondary | ICD-10-CM | POA: Diagnosis present

## 2021-08-12 DIAGNOSIS — I9589 Other hypotension: Secondary | ICD-10-CM | POA: Diagnosis not present

## 2021-08-12 LAB — CBC WITH DIFFERENTIAL/PLATELET
Abs Immature Granulocytes: 0.02 10*3/uL (ref 0.00–0.07)
Basophils Absolute: 0 10*3/uL (ref 0.0–0.1)
Basophils Relative: 0 %
Eosinophils Absolute: 0 10*3/uL (ref 0.0–0.5)
Eosinophils Relative: 0 %
HCT: 38.3 % (ref 36.0–46.0)
Hemoglobin: 12.1 g/dL (ref 12.0–15.0)
Immature Granulocytes: 0 %
Lymphocytes Relative: 38 %
Lymphs Abs: 2.1 10*3/uL (ref 0.7–4.0)
MCH: 30.8 pg (ref 26.0–34.0)
MCHC: 31.6 g/dL (ref 30.0–36.0)
MCV: 97.5 fL (ref 80.0–100.0)
Monocytes Absolute: 0.6 10*3/uL (ref 0.1–1.0)
Monocytes Relative: 11 %
Neutro Abs: 2.8 10*3/uL (ref 1.7–7.7)
Neutrophils Relative %: 51 %
Platelets: 142 10*3/uL — ABNORMAL LOW (ref 150–400)
RBC: 3.93 MIL/uL (ref 3.87–5.11)
RDW: 13.6 % (ref 11.5–15.5)
WBC: 5.5 10*3/uL (ref 4.0–10.5)
nRBC: 0 % (ref 0.0–0.2)

## 2021-08-12 LAB — COMPREHENSIVE METABOLIC PANEL
ALT: 17 U/L (ref 0–44)
AST: 28 U/L (ref 15–41)
Albumin: 3.2 g/dL — ABNORMAL LOW (ref 3.5–5.0)
Alkaline Phosphatase: 60 U/L (ref 38–126)
Anion gap: 10 (ref 5–15)
BUN: 16 mg/dL (ref 8–23)
CO2: 24 mmol/L (ref 22–32)
Calcium: 8.6 mg/dL — ABNORMAL LOW (ref 8.9–10.3)
Chloride: 109 mmol/L (ref 98–111)
Creatinine, Ser: 0.74 mg/dL (ref 0.44–1.00)
GFR, Estimated: >60 mL/min (ref >60)
Glucose, Bld: 93 mg/dL (ref 70–99)
Potassium: 3.1 mmol/L — ABNORMAL LOW (ref 3.5–5.1)
Sodium: 143 mmol/L (ref 135–145)
Total Bilirubin: 0.7 mg/dL (ref 0.3–1.2)
Total Protein: 6.4 g/dL — ABNORMAL LOW (ref 6.5–8.1)

## 2021-08-12 LAB — GLUCOSE, CAPILLARY
Glucose-Capillary: 99 mg/dL (ref 70–99)
Glucose-Capillary: 125 mg/dL — ABNORMAL HIGH (ref 70–99)
Glucose-Capillary: 106 mg/dL — ABNORMAL HIGH (ref 70–99)
Glucose-Capillary: 155 mg/dL — ABNORMAL HIGH (ref 70–99)

## 2021-08-12 LAB — C-REACTIVE PROTEIN: CRP: 9.7 mg/dL — ABNORMAL HIGH (ref <1.0)

## 2021-08-12 LAB — MAGNESIUM: Magnesium: 1.9 mg/dL (ref 1.7–2.4)

## 2021-08-12 MED ORDER — ADULT MULTIVITAMIN W/MINERALS CH
1.0000 | ORAL_TABLET | Freq: Every day | ORAL | Status: DC
  Administered 2021-08-12 – 2021-08-22 (×11): 1 via ORAL
  Filled 2021-08-12 (×11): qty 1

## 2021-08-12 MED ORDER — ENSURE ENLIVE PO LIQD
237.0000 mL | Freq: Two times a day (BID) | ORAL | Status: DC
  Administered 2021-08-12 – 2021-08-16 (×9): 237 mL via ORAL

## 2021-08-12 MED ORDER — POTASSIUM CHLORIDE CRYS ER 20 MEQ PO TBCR
40.0000 meq | EXTENDED_RELEASE_TABLET | Freq: Once | ORAL | Status: AC
  Administered 2021-08-12: 40 meq via ORAL
  Filled 2021-08-12: qty 2

## 2021-08-12 MED ORDER — PHENOL 1.4 % MT LIQD
1.0000 | OROMUCOSAL | Status: DC | PRN
  Administered 2021-08-12 – 2021-08-19 (×11): 1 via OROMUCOSAL
  Filled 2021-08-12: qty 177

## 2021-08-12 MED ORDER — PHENOL 1.4 % MT LIQD: 1.0000 | OROMUCOSAL | Status: DC | PRN

## 2021-08-12 MED ORDER — DICLOFENAC SODIUM 1 % EX GEL
2.0000 g | Freq: Four times a day (QID) | CUTANEOUS | Status: DC
  Administered 2021-08-12 – 2021-08-22 (×37): 2 g via TOPICAL
  Filled 2021-08-12: qty 100

## 2021-08-12 NOTE — Progress Notes (Signed)
Initial Nutrition Assessment ? ?DOCUMENTATION CODES:  ? ?Obesity unspecified ? ?INTERVENTION:  ? ?-Ensure Enlive po BID, each supplement provides 350 kcal and 20 grams of protein ?-MVI with minerals daily ? ?NUTRITION DIAGNOSIS:  ? ?Increased nutrient needs related to acute illness as evidenced by estimated needs. ? ?GOAL:  ? ?Patient will meet greater than or equal to 90% of their needs ? ?MONITOR:  ? ?PO intake, Supplement acceptance ? ?REASON FOR ASSESSMENT:  ? ?Malnutrition Screening Tool ?  ? ?ASSESSMENT:  ? ?Pt with medical history significant for coronary artery disease status post CABG, history of unstable angina, anxiety disorder, chronic diastolic dysfunction CHF, paroxysmal A-fib, hypertension, depression and anxiety as well as diabetes mellitus who presents for evaluation of feeling unwell. ? ?Pt admitted with COVID-19.  ? ?Reviewed I/O's: +250 ml x 24 hours ? ?Spoke with pt at bedside, who reports decreased appetite over the past 3 weeks, stating she did not feel like eating. She denies any changes to her taste or smell. Per pt, she typically consumes 2 meals per day (Breakfast: yogurt and Lunch: salad). Pt will occasionally snack between meals depending on the day.   ? ?Per pt, she has been fatigued and less steady on her foot; she admits to falling and hallucinating in her bathroom on Monday evening. ? ?Pt reports her UBW is around 170#. She estimates she has lost 5 pounds over the past 3 weeks. Reviewed wt hx; pt has experienced a 4.8% wt loss over the past month, which is not significant for time frame, but concerning given decreased oral intake. ? ?Discussed importance of good meal and supplement intake to promote healing. Pt amenable to supplements.  ? ?Medications reviewed and include vitamin C and zinc sulfate.  ? ?Lab Results  ?Component Value Date  ? HGBA1C 8.2 (H) 07/30/2021  ? PTA DM medications are 1.5 mg trulicity weekly, 18 units lantus solostar at bedtime, and 25 mg jardiance daily.   ? ?Labs reviewed: K: 3.1., CBGS: 99-133 (inpatient orders for glycemic control are 0-15 units insulin aspart TID with meals and 10 units insulin glargine-yfgn daily).   ? ?NUTRITION - FOCUSED PHYSICAL EXAM: ? ?Flowsheet Row Most Recent Value  ?Orbital Region No depletion  ?Upper Arm Region No depletion  ?Thoracic and Lumbar Region No depletion  ?Buccal Region No depletion  ?Temple Region No depletion  ?Clavicle Bone Region No depletion  ?Clavicle and Acromion Bone Region No depletion  ?Scapular Bone Region No depletion  ?Dorsal Hand No depletion  ?Patellar Region No depletion  ?Anterior Thigh Region No depletion  ?Posterior Calf Region No depletion  ?Edema (RD Assessment) None  ?Hair Reviewed  ?Eyes Reviewed  ?Mouth Reviewed  ?Skin Reviewed  ?Nails Reviewed  ? ?  ? ? ?Diet Order:   ?Diet Order   ? ?       ?  Diet Carb Modified Fluid consistency: Thin; Room service appropriate? Yes  Diet effective now       ?  ? ?  ?  ? ?  ? ? ?EDUCATION NEEDS:  ? ?Education needs have been addressed ? ?Skin:  Skin Assessment: Reviewed RN Assessment ? ?Last BM:  08/08/21 ? ?Height:  ? ?Ht Readings from Last 1 Encounters:  ?08/11/21 '5\' 2"'$  (1.575 m)  ? ? ?Weight:  ? ?Wt Readings from Last 1 Encounters:  ?08/11/21 74.8 kg  ? ? ?Ideal Body Weight:  50 kg ? ?BMI:  Body mass index is 30.18 kg/m?. ? ?Estimated Nutritional Needs:  ? ?Kcal:  1750-1950 ? ?Protein:  100-115 grams ? ?Fluid:  > 1.7 L ? ? ? ?Loistine Chance, RD, LDN, CDCES ?Registered Dietitian II ?Certified Diabetes Care and Education Specialist ?Please refer to [facility] for RD and/or RD on-call/weekend/after hours pager  ?

## 2021-08-12 NOTE — Hospital Course (Signed)
HPI on admission:   Candace [name] is a 71 y.o. female with medical history significant for coronary artery disease status post CABG, history of unstable angina, anxiety disorder, chronic diastolic dysfunction CHF, paroxysmal A-fib, hypertension, depression and anxiety as well as diabetes mellitus who presented to the ER on 08/11/2021 with complaints of 3-day history progressive generalized weakness, myalgias, sore throat, headache and productive cough, chills, poor p.o. intake and diarrhea.  She reported testing positive for COVID-19 on a home test 08/10/2021.  Due to worsening weakness and inability to ambulate at home, EMS was called, patient was found to be hypotensive and transported to the ER. ? ?Admitted for further evaluation management of COVID-19 infection.  No pneumonia or hypoxia.  Being treated with Paxlovid and supportive care. ?

## 2021-08-12 NOTE — Progress Notes (Addendum)
?Progress Note ? ? ?Patient: Candace [name] BJS:[id number] DOB: November 11, 1950 DOA: 08/11/2021     1 ?DOS: the patient was seen and examined on 08/12/2021 ?  ?Brief hospital course: ?HPI on admission:   Candace [name] is a 71 y.o. female with medical history significant for coronary artery disease status post CABG, history of unstable angina, anxiety disorder, chronic diastolic dysfunction CHF, paroxysmal A-fib, hypertension, depression and anxiety as well as diabetes mellitus who presented to the ER on 08/11/2021 with complaints of 3-day history progressive generalized weakness, myalgias, sore throat, headache and productive cough, chills, poor p.o. intake and diarrhea.  She reported testing positive for COVID-19 on a home test 08/10/2021.  Due to worsening weakness and inability to ambulate at home, EMS was called, patient was found to be hypotensive and transported to the ER. ? ?Admitted for further evaluation management of COVID-19 infection.  No pneumonia or hypoxia.  Being treated with Paxlovid and supportive care. ? ?Assessment and Plan: ?* COVID-19 ?Presented with complaints of profound generalized weakness, diarrhea, myalgias, sore throat, headache, cough productive of yellow phlegm and chills. ?Tested positive for COVID-19 at home on 5/15, COVID-19 PCR here positive as well.  Patient reports did receive COVID vaccines. ?Sepsis due to COVID-19-on arrival patient hypotensive and tachycardic. ?-- Continue Paxlovid ?-- Steroids deferred given hypoxia ?-- Supportive care with bronchodilators, antitussives, antipyretics as needed, vitamins, pulmonary hygiene ?-- Supplemental oxygen if needed to maintain sats above 90% ? ?Type 2 diabetes mellitus with diabetic polyneuropathy, with long-term current use of insulin ([place]) ?On reduced dose of basal insulin, 10 units at bedtime.  Sliding scale NovoLog.  Adjust insulin for goal 140-180. ? ?Chronic diastolic CHF (congestive heart failure) ([place]) ?Patient is overall euvolemic  and well compensated.  Last echo from April 2023 showed EF 55 to 60%, grade 1 diastolic dysfunction. ?Due to hypotension on admission, Lasix, Entresto, metoprolol and nitrates were held. ? ?5/17 BPs remain soft but stable.  Continue holding above meds. ? ?Essential hypertension ?Antihypertensives are on hold due to soft BPs.  Resume when indicated ? ?Paroxysmal atrial fibrillation (HCC) ?Overall rate controlled, tachycardia secondary to COVID infection. ?--Metoprolol on hold due to hypotension on admission, BPs remain soft. ?--Continue Eliquis  ? ?Depression with anxiety ?Continue Zoloft and Xanax ? ?Hypokalemia ?K3.1 this morning.   ?Replace with oral K-Cl 40 mEq.  ?Monitor BMP and replace K as needed.  Monitor Mg level as well. ? ?Hypotension ?Most likely secondary to hypovolemia in the setting of nausea, vomiting, diarrhea and poor p.o. intake. ?On arrival, systolic BP was low 73X, responded well to IV fluid bolus. ?-- Maintain MAP above 65 with fluids if needed ?-- Monitor volume status closely if requiring fluids  ? ? ? ? ?  ? ?Subjective: Patient awake sitting up in bed when seen on rounds today.  She continues to have a sore throat and cough which is causing significant pain in the central chest.  Denies fevers or chills.  Still feels quite weak.  Been so weak at home she was unable to get to the restroom.  Has not experienced for sore throat.  Denies having any further diarrhea since admission ?At this time. ? ?Physical Exam: ?Vitals:  ? 08/12/21 0104 08/12/21 0531 08/12/21 0809 08/12/21 1147  ?BP: (!) 104/53 (!) 130/56 (!) 116/57 117/63  ?Pulse: 89 100 (!) 101 (!) 106  ?Resp: '17  16 16  '$ ?Temp: (!) 97.5 ?F (36.4 ?C) 98.1 ?F (36.7 ?C) 98.7 ?F (37.1 ?C) 98.8 ?F (37.1 ?  C)  ?TempSrc:   Oral Oral  ?SpO2: 92% 93% 94% 94%  ?Weight:      ?Height:      ? ?General exam: awake, alert, no acute distress ?HEENT: atraumatic, clear conjunctiva, anicteric sclera, moist mucus membranes, hearing grossly normal  ?Respiratory  system: CTAB, no wheezes, rales or rhonchi, normal respiratory effort. ?Cardiovascular system: normal S1/S2, tachycardic, regular rhythm, no significant peripheral edema.,  2+ radial pulses. ?Gastrointestinal system: soft, tender nondistended abdomen ?Central nervous system: A&O x4. no gross focal neurologic deficits, normal speech ?Extremities: moves all, no edema, normal tone ?Skin: dry, intact, normal temperature ?Psychiatry: normal mood, congruent affect, judgement and insight appear normal ? ? ?Data Reviewed: ? ?Notable labs: Potassium 3.1, calcium 8.6, albumin 3.2, total protein 6.4, CRP 9.7, platelets 142 down from 158 ? ?CTA chest --- IMPRESSION: ?1. No large or central pulmonary artery embolus identified. ?2. Mild pleural thickening versus less likely trace right pleural ?effusion. ? ?Family Communication: Updated daughter [name] by phone this afternoon 5/17 ? ?Disposition: ?Status is: Inpatient ?Remains inpatient appropriate because: Severity of illness with profound weakness and soft BP despite held medications as outlined.  Anticipate discharge home in 2 to 3 days pending further clinical improvement. ? ? Planned Discharge Destination: Home ? ? ? ?Time spent: 35 minutes ? ?Author: ?Ezekiel Slocumb, DO ?08/12/2021 12:54 PM ? ?For on call review [email or web address].  ?

## 2021-08-12 NOTE — Assessment & Plan Note (Addendum)
Resolved with replacement Monitor BMP and replace K as needed.  Monitor Mg level as well.

## 2021-08-13 DIAGNOSIS — U071 COVID-19: Secondary | ICD-10-CM | POA: Diagnosis not present

## 2021-08-13 DIAGNOSIS — R7881 Bacteremia: Secondary | ICD-10-CM | POA: Clinically undetermined

## 2021-08-13 DIAGNOSIS — I4891 Unspecified atrial fibrillation: Secondary | ICD-10-CM | POA: Diagnosis not present

## 2021-08-13 LAB — CBC WITH DIFFERENTIAL/PLATELET
Abs Immature Granulocytes: 0.01 10*3/uL (ref 0.00–0.07)
Basophils Absolute: 0 10*3/uL (ref 0.0–0.1)
Basophils Relative: 1 %
Eosinophils Absolute: 0.1 10*3/uL (ref 0.0–0.5)
Eosinophils Relative: 2 %
HCT: 37.1 % (ref 36.0–46.0)
Hemoglobin: 11.7 g/dL — ABNORMAL LOW (ref 12.0–15.0)
Immature Granulocytes: 0 %
Lymphocytes Relative: 47 %
Lymphs Abs: 2.2 10*3/uL (ref 0.7–4.0)
MCH: 31.2 pg (ref 26.0–34.0)
MCHC: 31.5 g/dL (ref 30.0–36.0)
MCV: 98.9 fL (ref 80.0–100.0)
Monocytes Absolute: 0.4 10*3/uL (ref 0.1–1.0)
Monocytes Relative: 10 %
Neutro Abs: 1.8 10*3/uL (ref 1.7–7.7)
Neutrophils Relative %: 40 %
Platelets: 143 10*3/uL — ABNORMAL LOW (ref 150–400)
RBC: 3.75 MIL/uL — ABNORMAL LOW (ref 3.87–5.11)
RDW: 13.8 % (ref 11.5–15.5)
WBC: 4.6 10*3/uL (ref 4.0–10.5)
nRBC: 0 % (ref 0.0–0.2)

## 2021-08-13 LAB — BLOOD CULTURE ID PANEL (REFLEXED) - BCID2

## 2021-08-13 LAB — COMPREHENSIVE METABOLIC PANEL
ALT: 16 U/L (ref 0–44)
AST: 33 U/L (ref 15–41)
Albumin: 3.1 g/dL — ABNORMAL LOW (ref 3.5–5.0)
Alkaline Phosphatase: 58 U/L (ref 38–126)
Anion gap: 9 (ref 5–15)
BUN: 13 mg/dL (ref 8–23)
CO2: 24 mmol/L (ref 22–32)
Calcium: 8.3 mg/dL — ABNORMAL LOW (ref 8.9–10.3)
Chloride: 108 mmol/L (ref 98–111)
Creatinine, Ser: 0.73 mg/dL (ref 0.44–1.00)
GFR, Estimated: >60 mL/min (ref >60)
Glucose, Bld: 89 mg/dL (ref 70–99)
Potassium: 3.3 mmol/L — ABNORMAL LOW (ref 3.5–5.1)
Sodium: 141 mmol/L (ref 135–145)
Total Bilirubin: 0.5 mg/dL (ref 0.3–1.2)
Total Protein: 6.3 g/dL — ABNORMAL LOW (ref 6.5–8.1)

## 2021-08-13 LAB — GLUCOSE, CAPILLARY
Glucose-Capillary: 198 mg/dL — ABNORMAL HIGH (ref 70–99)
Glucose-Capillary: 146 mg/dL — ABNORMAL HIGH (ref 70–99)
Glucose-Capillary: 108 mg/dL — ABNORMAL HIGH (ref 70–99)
Glucose-Capillary: 118 mg/dL — ABNORMAL HIGH (ref 70–99)

## 2021-08-13 LAB — MAGNESIUM: Magnesium: 1.9 mg/dL (ref 1.7–2.4)

## 2021-08-13 LAB — C-REACTIVE PROTEIN: CRP: 5.9 mg/dL — ABNORMAL HIGH (ref <1.0)

## 2021-08-13 MED ORDER — METOPROLOL TARTRATE 25 MG PO TABS: 25.0000 mg | ORAL_TABLET | Freq: Two times a day (BID) | ORAL | Status: DC

## 2021-08-13 MED ORDER — POTASSIUM CHLORIDE CRYS ER 20 MEQ PO TBCR
40.0000 meq | EXTENDED_RELEASE_TABLET | ORAL | Status: AC
  Administered 2021-08-13 (×2): 40 meq via ORAL
  Filled 2021-08-13 (×2): qty 2

## 2021-08-13 MED ORDER — METOPROLOL TARTRATE 5 MG/5ML IV SOLN: 5.0000 mg | Freq: Four times a day (QID) | INTRAVENOUS | Status: DC | PRN

## 2021-08-13 MED ORDER — METOPROLOL TARTRATE 25 MG PO TABS
25.0000 mg | ORAL_TABLET | Freq: Two times a day (BID) | ORAL | Status: DC
  Administered 2021-08-13 – 2021-08-15 (×4): 25 mg via ORAL
  Filled 2021-08-13 (×4): qty 1

## 2021-08-13 NOTE — Progress Notes (Addendum)
Progress Note   Patient: Candace [name] DOB: [birth year] DOA: 08/11/2021     2 DOS: the patient was seen and examined on 08/13/2021   Brief hospital course: HPI on admission:   Candace [name] is a 71 y.o. female with medical history significant for coronary artery disease status post CABG, history of unstable angina, anxiety disorder, chronic diastolic dysfunction CHF, paroxysmal A-fib, hypertension, depression and anxiety as well as diabetes mellitus who presented to the ER on 08/11/2021 with complaints of 3-day history progressive generalized weakness, myalgias, sore throat, headache and productive cough, chills, poor p.o. intake and diarrhea.  She reported testing positive for COVID-19 on a home test 08/10/2021.  Due to worsening weakness and inability to ambulate at home, EMS was called, patient was found to be hypotensive and transported to the ER.  Admitted for further evaluation management of COVID-19 infection.  No pneumonia or hypoxia.  Being treated with Paxlovid and supportive care.  Assessment and Plan: * [id number] Presented with complaints of profound generalized weakness, diarrhea, myalgias, sore throat, headache, cough productive of yellow phlegm and chills. Tested positive for COVID-19 at home on 5/15, COVID-19 PCR here positive as well.  Patient reports did receive COVID vaccines. Sepsis due to COVID-19-on arrival patient hypotensive and tachycardic. -- Continue Paxlovid -- Steroids deferred given hypoxia -- Supportive care with bronchodilators, antitussives, antipyretics as needed, vitamins, pulmonary hygiene -- Supplemental oxygen if needed to maintain sats above 90%  Type 2 diabetes mellitus with diabetic polyneuropathy, with long-term current use of insulin (HCC) On reduced dose of basal insulin, 10 units at bedtime.  Sliding scale NovoLog.  Adjust insulin for goal 140-180.  Chronic diastolic CHF (congestive heart failure) ([place]) Patient is overall euvolemic  and well compensated.  Last echo from April 2023 showed EF 55 to 19%, grade 1 diastolic dysfunction. Due to hypotension on admission, Lasix, Entresto, metoprolol and nitrates were held.  5/17 BPs remain soft but stable.  Continue holding above meds.  Essential hypertension Antihypertensives are on hold due to soft BPs.  Resume when indicated  Paroxysmal atrial fibrillation (HCC) Overall rate controlled, tachycardia secondary to COVID infection. --Metoprolol on hold due to hypotension on admission, BPs remain soft. --Continue Eliquis   Depression with anxiety Continue Zoloft and Xanax  Atrial fibrillation with RVR ([place]) Afternoon 5/18: Patient heart rate sustaining in 120s.  Takes metoprolol tartrate 100 mg twice daily at home.  This has been held due to softer BPs. -- Resume oral Lopressor at 25 mg twice daily for now, hold off if MAP less than 65 -- IV Lopressor pushes as needed if BP will tolerate -- If pressures too soft for use of beta-blockers, would start amiodarone  Positive blood culture 1 of 4 bottles (anaerobic) admission blood culture positive for staph species.  Contaminant suspected. Follow cultures to final result. Repeat blood culture.  Hypokalemia K3.3 this morning.   Replace with oral K-Cl 40 mEq x2.  Monitor BMP and replace K as needed.  Monitor Mg level as well.  Hypotension Most likely secondary to hypovolemia in the setting of nausea, vomiting, diarrhea and poor p.o. intake. On arrival, systolic BP was low 62I, responded well to IV fluid bolus. -- Maintain MAP above 65 with fluids if needed -- Monitor volume status closely if requiring fluids         Subjective: Patient awake resting in bed when seen on rounds.  She reports still feeling bad today, has a headache this morning.  Still feels extremely weak.  She worked with PT earlier and got very dizzy.  Found to be orthostatic.  She denies any further nausea vomiting.  Does report having a BM this  morning.  No fevers chills but occasionally does feel cold.  No other acute complaints at this time  Physical Exam: Vitals:   08/12/21 2114 08/13/21 0716 08/13/21 1221 08/13/21 1636  BP: 117/65 127/64 115/62 124/86  Pulse: (!) 103 83 90 (!) 123  Resp: '19 18 16 18  '$ Temp: 98.8 F (37.1 C) 97.8 F (36.6 C) 98.3 F (36.8 C) 98.1 F (36.7 C)  TempSrc: Oral   Oral  SpO2: 95% 95% 97% 97%  Weight:      Height:       General exam: awake, alert, no acute distress HEENT: Hearing grossly normal, moist mucous membranes Respiratory system: Lungs clear, normal respiratory effort, on room air. Cardiovascular system: RRR, no significant peripheral edema.,  2+ radial pulses. Gastrointestinal system: soft, tender nondistended abdomen Central nervous system: A&O x4. no gross focal neurologic deficits, normal speech but voice is a bit tremulous Extremities: moves all, no edema, normal tone Psychiatry: normal mood, congruent affect, judgement and insight appear normal   Data Reviewed:  Notable labs: Potassium 3.3, calcium 8.3 with albumin 3.1, total protein 6.3, CRP downtrending 5.9, hemoglobin 11.7, 6 platelets stable 143    Family Communication: Updated daughter [name] by phone afternoon 5/17  Disposition: Status is: Inpatient Remains inpatient appropriate because: Severity of illness with profound weakness and soft BP despite held medications as outlined.  Anticipate discharge home in 2 to 3 days pending further clinical improvement.   Planned Discharge Destination: Home    Time spent: 35 minutes  Author: Ezekiel Slocumb, DO 08/13/2021 4:51 PM  For on call review [email or web address].

## 2021-08-13 NOTE — Assessment & Plan Note (Addendum)
1 of 4 bottles (anaerobic) admission blood culture positive for staph species.  Contaminant suspected. Follow cultures to final result. Repeat blood culture negative to date.

## 2021-08-13 NOTE — Progress Notes (Signed)
PHARMACY - PHYSICIAN COMMUNICATION CRITICAL VALUE ALERT - BLOOD CULTURE IDENTIFICATION (BCID)  BCID:  1 (anaerobic) of 4 bottles w/ Staph Species.  Pt not on any abx, but is COVID+ on Paxlovid.  WBC normal, pt afebrile.  Name of provider contacted: Morton Amy, NP  Changes to prescribed antibiotics required: Suspect possible contamination, no changes and continue to monitor.  Renda Rolls, PharmD, [facility] 08/13/2021 3:17 AM

## 2021-08-13 NOTE — Progress Notes (Signed)
   08/13/21 1636  Assess: MEWS Score  Temp 98.1 F (36.7 C)  BP 124/86  Pulse Rate (!) 123  Resp 18  SpO2 97 %  O2 Device Room Air  Assess: MEWS Score  MEWS Temp 0  MEWS Systolic 0  MEWS Pulse 2  MEWS RR 0  MEWS LOC 0  MEWS Score 2  MEWS Score Color Yellow  Assess: if the MEWS score is Yellow or Red  Were vital signs taken at a resting state? Yes  Focused Assessment Change from prior assessment (see assessment flowsheet)  Does the patient meet 2 or more of the SIRS criteria? No  MEWS guidelines implemented *See Row Information* Yes  Treat  MEWS Interventions Escalated (See documentation below)  Notify: Charge Nurse/RN  Name of Charge Nurse/RN Notified Caryl Pina RN  Date Charge Nurse/RN Notified 08/13/21  Time Charge Nurse/RN Notified 1638  Notify: Provider  Provider Name/Title Dr Arbutus Ped  Date Provider Notified 08/13/21  Time Provider Notified 1639  Method of Notification  (secure chay)  Notification Reason Change in status  Provider response See new orders  Date of Provider Response 08/13/21  Time of Provider Response 1639  Assess: SIRS CRITERIA  SIRS Temperature  0  SIRS Pulse 1  SIRS Respirations  0  SIRS WBC 0  SIRS Score Sum  1

## 2021-08-13 NOTE — Progress Notes (Signed)
   08/13/21 1636  Assess: MEWS Score  Temp 98.1 F (36.7 C)  BP 124/86  Pulse Rate (!) 123  Resp 18  SpO2 97 %  O2 Device Room Air  Assess: MEWS Score  MEWS Temp 0  MEWS Systolic 0  MEWS Pulse 2  MEWS RR 0  MEWS LOC 0  MEWS Score 2  MEWS Score Color Yellow  Assess: if the MEWS score is Yellow or Red  Were vital signs taken at a resting state? Yes  Focused Assessment Change from prior assessment (see assessment flowsheet)  Does the patient meet 2 or more of the SIRS criteria? No  MEWS guidelines implemented *See Row Information* Yes  Treat  MEWS Interventions Escalated (See documentation below)  Notify: Charge Nurse/RN  Name of Charge Nurse/RN Notified Caryl Pina RN  Date Charge Nurse/RN Notified 08/13/21  Time Charge Nurse/RN Notified 1638  Notify: Provider  Provider Name/Title Dr Arbutus Ped  Date Provider Notified 08/13/21  Time Provider Notified 1639  Method of Notification  (secure chay)  Notification Reason Change in status  Provider response See new orders  Date of Provider Response 08/13/21  Time of Provider Response 1639  Document  Patient Outcome Other (Comment) (will continue to monitor)  Progress note created (see row info) Yes  Assess: SIRS CRITERIA  SIRS Temperature  0  SIRS Pulse 1  SIRS Respirations  0  SIRS WBC 0  SIRS Score Sum  1

## 2021-08-13 NOTE — Assessment & Plan Note (Addendum)
Afternoon 5/18: Patient heart rate sustaining in 120s.  Takes metoprolol tartrate 100 mg twice daily at home.  This has been held due to softer BPs. -- Continue Lopressor at 25 mg twice daily for now, hold if MAP < 65 -- IV Lopressor pushes PRN if BP will tolerate

## 2021-08-13 NOTE — Evaluation (Signed)
Physical Therapy Evaluation Patient Details Name: Candace [name] MRN: [id number] DOB: [birth year] Today's Date: 08/13/2021  History of Present Illness  Patient is a 71 year old female with COVID-19 presenting with complaints of profound generalized weakness, diarrhea, myalgias, sore throat, headache, cough productive of yellow phlegm and chills. Sepsis due to COVID-19 with hypotension and tachycardia.  Clinical Impression  Patient was sitting up on the bedside commode on arrival to the room. She complains of feeling weakness, lightheaded, dizzy, and requested to get back to bed. She required maximal assistance for pivot transfer to bed. She was unable to attempt walking due to dizziness with upright activity. She also reports her heart felt like it was racing (heart rate 90-99bpm) with Sp02 98% on room air. Recommend to check blood pressure with next standing bouts. The patient also reports multiple recent falls at home and feeling generalized weakness. She lives alone but does have family members that she states could check on her. She is unsafe to discharge home without assistance at this time due to her high fall risk. SNF is recommended, although patient declined and wishes to return home. PT will continue to follow in attempts to maximize functional independence and facilitate return to prior level of function.      Recommendations for follow up therapy are one component of a multi-disciplinary discharge planning process, led by the attending physician.  Recommendations may be updated based on patient status, additional functional criteria and insurance authorization.  Follow Up Recommendations Skilled nursing-short term rehab (<3 hours/day)    Assistance Recommended at Discharge Intermittent Supervision/Assistance  Patient can return home with the following  A lot of help with walking and/or transfers;A lot of help with bathing/dressing/bathroom;Assist for transportation;Help with stairs or  ramp for entrance    Equipment Recommendations None recommended by PT  Recommendations for Other Services       Functional Status Assessment Patient has had a recent decline in their functional status and demonstrates the ability to make significant improvements in function in a reasonable and predictable amount of time.     Precautions / Restrictions Precautions Precautions: Fall Restrictions Weight Bearing Restrictions: No      Mobility  Bed Mobility Overal bed mobility: Needs Assistance Bed Mobility: Sit to Supine       Sit to supine: Mod assist   General bed mobility comments: assistance for BLE support. verbal cues for technique. patient very fatigued with minimal activity    Transfers Overall transfer level: Needs assistance   Transfers: Bed to chair/wheelchair/BSC   Stand pivot transfers: Max assist         General transfer comment: the patient was sitting up on bed side commode on arrival to the room. she reports feeling lightheaded, dizzy, weakness. maximal assistance required to safely assist patient back to bed. she also reports like her heart is "racing". Sp02 98% and heart rate 92-99bpm    Ambulation/Gait               General Gait Details: unsafe/unable to attempt due to dizziness with sitting/standing activity. the patient reports multiple recent falls at home  Stairs            Wheelchair Mobility    Modified Rankin (Stroke Patients Only)       Balance Overall balance assessment: Needs assistance, History of Falls Sitting-balance support: Feet supported Sitting balance-[name] Scale: Fair     Standing balance support: No upper extremity supported Standing balance-[name] Scale: Zero Standing balance comment: maximal assistance required  Pertinent Vitals/Pain Pain Assessment Pain Assessment: No/denies pain    Home Living Family/patient expects to be discharged to:: Private  residence Living Arrangements: Alone Available Help at Discharge: Family;Available PRN/intermittently Type of Home: Apartment Home Access: Level entry       Home Layout: One level Home Equipment: Conservation officer, nature (2 wheels);Cane - single point      Prior Function Prior Level of Function : History of Falls (last six months);Independent/Modified Independent             Mobility Comments: patient reports she is independent with activity. multiple recent falls at home ADLs Comments: independent per patient report     Hand Dominance        Extremity/Trunk Assessment   Upper Extremity Assessment Upper Extremity Assessment: Generalized weakness    Lower Extremity Assessment Lower Extremity Assessment: Generalized weakness       Communication   Communication: No difficulties  Cognition Arousal/Alertness: Awake/alert Behavior During Therapy: WFL for tasks assessed/performed Overall Cognitive Status: Within Functional Limits for tasks assessed                                          General Comments General comments (skin integrity, edema, etc.): patient appears to have a resting tremor of head that she reports has been present for ~ 2 months    Exercises     Assessment/Plan    PT Assessment Patient needs continued PT services  PT Problem List Decreased strength;Decreased range of motion;Decreased activity tolerance;Decreased balance;Decreased mobility;Decreased safety awareness       PT Treatment Interventions DME instruction;Gait training;Stair training;Functional mobility training;Therapeutic activities;Therapeutic exercise;Balance training;Neuromuscular re-education;Patient/family education    PT Goals (Current goals can be found in the Care Plan section)  Acute Rehab PT Goals Patient Stated Goal: to return home PT Goal Formulation: With patient Time For Goal Achievement: 08/27/21 Potential to Achieve Goals: Fair    Frequency Min  2X/week     Co-evaluation               AM-PAC PT "6 Clicks" Mobility  Outcome Measure Help needed turning from your back to your side while in a flat bed without using bedrails?: A Little Help needed moving from lying on your back to sitting on the side of a flat bed without using bedrails?: A Lot Help needed moving to and from a bed to a chair (including a wheelchair)?: A Lot Help needed standing up from a chair using your arms (e.g., wheelchair or bedside chair)?: A Lot Help needed to walk in hospital room?: A Lot Help needed climbing 3-5 steps with a railing? : A Lot 6 Click Score: 13    End of Session   Activity Tolerance: Patient limited by fatigue Patient left: in bed;with call bell/phone within reach;with bed alarm set Nurse Communication: Mobility status;Precautions (discussed with nurse aide) PT Visit Diagnosis: Unsteadiness on feet (R26.81);Muscle weakness (generalized) (M62.81);History of falling (Z91.81)    Time: 1610-9604 PT Time Calculation (min) (ACUTE ONLY): 32 min   Charges:   PT Evaluation $PT Eval Moderate Complexity: 1 Mod PT Treatments $Therapeutic Activity: 8-22 mins        Minna Merritts, PT, MPT   Percell Locus 08/13/2021, 12:42 PM

## 2021-08-13 NOTE — Evaluation (Signed)
Occupational Therapy Evaluation Patient Details Name: Candace [name] MRN: [id number] DOB: 1950/05/30 Today's Date: 08/13/2021   History of Present Illness Patient is a 71 year old female with COVID-19 presenting with complaints of profound generalized weakness, diarrhea, myalgias, sore throat, headache, cough productive of yellow phlegm and chills. Sepsis due to COVID-19 with hypotension and tachycardia.   Clinical Impression   Patient presenting with decreased Ind in self care,balance, functional mobility/transfers, endurance, and safety awareness. Patient reports living at home independently and alone at baseline. Pt reports just generally feeling unwell this session but willing to try mobility. Pt needing mod - max A for bed mobility and standing and fatigues very quickly. Pt is symptomatic with supine >sit and sit >stand. Pt only able to stand for 2 minutes before needing to return to bed. She reports feeling very cold and noted to have tremor while supine. Temp was 98.2 when checked. Patient will benefit from acute OT to increase overall independence in the areas of ADLs, functional mobility, and safety awareness in order to safely discharge to next venue of care.   BP:  Supine :93/68 (HR 68) EOB: 107/63 (HR 78) Standing: 110/48(HR 68)     Recommendations for follow up therapy are one component of a multi-disciplinary discharge planning process, led by the attending physician.  Recommendations may be updated based on patient status, additional functional criteria and insurance authorization.   Follow Up Recommendations  Skilled nursing-short term rehab (<3 hours/day)    Assistance Recommended at Discharge Frequent or constant Supervision/Assistance  Patient can return home with the following A lot of help with walking and/or transfers;A lot of help with bathing/dressing/bathroom;Help with stairs or ramp for entrance;Assistance with cooking/housework;Assist for transportation     Functional Status Assessment  Patient has had a recent decline in their functional status and demonstrates the ability to make significant improvements in function in a reasonable and predictable amount of time.  Equipment Recommendations  Other (comment) (defer to next venue of care)       Precautions / Restrictions Precautions Precautions: Fall Restrictions Weight Bearing Restrictions: No      Mobility Bed Mobility Overal bed mobility: Needs Assistance Bed Mobility: Sit to Supine       Sit to supine: Mod assist   General bed mobility comments: heavy assist for trunk support to EOB with cuing for technique and hand placement    Transfers Overall transfer level: Needs assistance Equipment used: 1 person hand held assist Transfers: Sit to/from Stand Sit to Stand: Mod assist           General transfer comment: Pt reports feeling very dizzy and unwell and stands for ~ 2 minutes before sitting down      Balance Overall balance assessment: Needs assistance, History of Falls Sitting-balance support: Feet supported Sitting balance-[name] Scale: Fair     Standing balance support: No upper extremity supported Standing balance-[name] Scale: Zero                             ADL either performed or assessed with clinical judgement   ADL Overall ADL's : Needs assistance/impaired     Grooming: Wash/dry hands;Wash/dry face;Set up;Supervision/safety;Sitting                                 General ADL Comments: limited this session secondary to feeling unwell and symptomatic     Vision  Patient Visual Report: No change from baseline              Pertinent Vitals/Pain Pain Assessment Pain Assessment: No/denies pain     Hand Dominance Right   Extremity/Trunk Assessment Upper Extremity Assessment Upper Extremity Assessment: Generalized weakness   Lower Extremity Assessment Lower Extremity Assessment: Generalized weakness        Communication Communication Communication: No difficulties   Cognition Arousal/Alertness: Awake/alert Behavior During Therapy: WFL for tasks assessed/performed Overall Cognitive Status: Within Functional Limits for tasks assessed                                       General Comments  patient appears to have a resting tremor of head that she reports has been present for ~ 2 months            Home Living Family/patient expects to be discharged to:: Private residence Living Arrangements: Alone Available Help at Discharge: Family;Available PRN/intermittently Type of Home: Apartment Home Access: Level entry     Home Layout: One level     Bathroom Shower/Tub: Occupational psychologist: Standard     Home Equipment: Conservation officer, nature (2 wheels);Cane - single point          Prior Functioning/Environment Prior Level of Function : History of Falls (last six months);Independent/Modified Independent             Mobility Comments: patient reports she is independent with activity. multiple recent falls at home ADLs Comments: independent per patient report        OT Problem List: Decreased strength;Decreased activity tolerance;Impaired balance (sitting and/or standing);Decreased safety awareness;Decreased knowledge of use of DME or AE      OT Treatment/Interventions: Self-care/ADL training;Balance training;Therapeutic exercise;Neuromuscular education;Therapeutic activities;DME and/or AE instruction;Manual therapy;Patient/family education;Energy conservation    OT Goals(Current goals can be found in the care plan section) Acute Rehab OT Goals Patient Stated Goal: to feel better and get stronger OT Goal Formulation: With patient Time For Goal Achievement: 08/27/21 Potential to Achieve Goals: Fair ADL Goals Pt Will Perform Grooming: with min guard assist;standing Pt Will Perform Lower Body Dressing: with min assist;sit to/from stand Pt Will Transfer  to Toilet: with min assist;ambulating Pt Will Perform Toileting - Clothing Manipulation and hygiene: with min assist;sit to/from stand  OT Frequency: Min 2X/week       AM-PAC OT "6 Clicks" Daily Activity     Outcome Measure Help from another person eating meals?: None Help from another person taking care of personal grooming?: A Little Help from another person toileting, which includes using toliet, bedpan, or urinal?: A Lot Help from another person bathing (including washing, rinsing, drying)?: A Lot Help from another person to put on and taking off regular upper body clothing?: A Little Help from another person to put on and taking off regular lower body clothing?: A Lot 6 Click Score: 16   End of Session Nurse Communication: Mobility status  Activity Tolerance: Other (comment) (limited by dizziness) Patient left: in bed;with call bell/phone within reach;with bed alarm set  OT Visit Diagnosis: Unsteadiness on feet (R26.81);Muscle weakness (generalized) (M62.81)                Time: 1040-1102 OT Time Calculation (min): 22 min Charges:  OT General Charges $OT Visit: 1 Visit OT Evaluation $OT Eval Moderate Complexity: 1 Mod OT Treatments $Therapeutic Activity: 8-22 mins  Darleen Crocker, MS,  OTR/L , CBIS ascom [phone]  08/13/21, 1:57 PM

## 2021-08-14 DIAGNOSIS — E669 Obesity, unspecified: Secondary | ICD-10-CM | POA: Diagnosis present

## 2021-08-14 DIAGNOSIS — U071 COVID-19: Secondary | ICD-10-CM | POA: Diagnosis not present

## 2021-08-14 LAB — CBC WITH DIFFERENTIAL/PLATELET
Abs Immature Granulocytes: 0.01 10*3/uL (ref 0.00–0.07)
Basophils Absolute: 0 10*3/uL (ref 0.0–0.1)
Basophils Relative: 0 %
Eosinophils Absolute: 0.1 10*3/uL (ref 0.0–0.5)
Eosinophils Relative: 2 %
HCT: 38.7 % (ref 36.0–46.0)
Hemoglobin: 12.1 g/dL (ref 12.0–15.0)
Immature Granulocytes: 0 %
Lymphocytes Relative: 44 %
Lymphs Abs: 2.1 10*3/uL (ref 0.7–4.0)
MCH: 31 pg (ref 26.0–34.0)
MCHC: 31.3 g/dL (ref 30.0–36.0)
MCV: 99.2 fL (ref 80.0–100.0)
Monocytes Absolute: 0.3 10*3/uL (ref 0.1–1.0)
Monocytes Relative: 6 %
Neutro Abs: 2.2 10*3/uL (ref 1.7–7.7)
Neutrophils Relative %: 48 %
Platelets: 151 10*3/uL (ref 150–400)
RBC: 3.9 MIL/uL (ref 3.87–5.11)
RDW: 13.8 % (ref 11.5–15.5)
WBC: 4.7 10*3/uL (ref 4.0–10.5)
nRBC: 0 % (ref 0.0–0.2)

## 2021-08-14 LAB — COMPREHENSIVE METABOLIC PANEL
ALT: 17 U/L (ref 0–44)
AST: 30 U/L (ref 15–41)
Albumin: 3 g/dL — ABNORMAL LOW (ref 3.5–5.0)
Alkaline Phosphatase: 71 U/L (ref 38–126)
Anion gap: 11 (ref 5–15)
BUN: 13 mg/dL (ref 8–23)
CO2: 23 mmol/L (ref 22–32)
Calcium: 8.6 mg/dL — ABNORMAL LOW (ref 8.9–10.3)
Chloride: 106 mmol/L (ref 98–111)
Creatinine, Ser: 0.63 mg/dL (ref 0.44–1.00)
GFR, Estimated: >60 mL/min (ref >60)
Glucose, Bld: 180 mg/dL — ABNORMAL HIGH (ref 70–99)
Potassium: 3.7 mmol/L (ref 3.5–5.1)
Sodium: 140 mmol/L (ref 135–145)
Total Bilirubin: 0.5 mg/dL (ref 0.3–1.2)
Total Protein: 6.1 g/dL — ABNORMAL LOW (ref 6.5–8.1)

## 2021-08-14 LAB — GLUCOSE, CAPILLARY
Glucose-Capillary: 105 mg/dL — ABNORMAL HIGH (ref 70–99)
Glucose-Capillary: 152 mg/dL — ABNORMAL HIGH (ref 70–99)
Glucose-Capillary: 100 mg/dL — ABNORMAL HIGH (ref 70–99)
Glucose-Capillary: 167 mg/dL — ABNORMAL HIGH (ref 70–99)

## 2021-08-14 LAB — C-REACTIVE PROTEIN: CRP: 3.1 mg/dL — ABNORMAL HIGH (ref <1.0)

## 2021-08-14 LAB — MAGNESIUM: Magnesium: 1.7 mg/dL (ref 1.7–2.4)

## 2021-08-14 MED ORDER — APIXABAN 5 MG PO TABS: 5.0000 mg | ORAL_TABLET | Freq: Two times a day (BID) | ORAL | Status: DC

## 2021-08-14 MED ORDER — LEVALBUTEROL TARTRATE 45 MCG/ACT IN AERO
1.0000 | INHALATION_SPRAY | Freq: Four times a day (QID) | RESPIRATORY_TRACT | Status: DC
  Administered 2021-08-14 – 2021-08-15 (×3): 2 via RESPIRATORY_TRACT
  Administered 2021-08-15: 1 via RESPIRATORY_TRACT
  Administered 2021-08-15 – 2021-08-16 (×3): 2 via RESPIRATORY_TRACT
  Administered 2021-08-16: 1 via RESPIRATORY_TRACT
  Administered 2021-08-17 – 2021-08-21 (×14): 2 via RESPIRATORY_TRACT
  Administered 2021-08-21: 1 via RESPIRATORY_TRACT
  Administered 2021-08-21 – 2021-08-22 (×3): 2 via RESPIRATORY_TRACT
  Filled 2021-08-14: qty 15

## 2021-08-14 MED ORDER — DM-GUAIFENESIN ER 30-600 MG PO TB12: 1.0000 | ORAL_TABLET | Freq: Two times a day (BID) | ORAL | Status: DC

## 2021-08-14 MED ORDER — DM-GUAIFENESIN ER 30-600 MG PO TB12
1.0000 | ORAL_TABLET | Freq: Two times a day (BID) | ORAL | Status: DC
  Administered 2021-08-14 – 2021-08-22 (×17): 1 via ORAL
  Filled 2021-08-14 (×17): qty 1

## 2021-08-14 MED ORDER — APIXABAN 5 MG PO TABS
5.0000 mg | ORAL_TABLET | Freq: Two times a day (BID) | ORAL | Status: DC
Start: 2021-08-17 — End: 2021-08-16

## 2021-08-14 MED ORDER — MAGNESIUM SULFATE 2 GM/50ML IV SOLN
2.0000 g | Freq: Once | INTRAVENOUS | Status: AC
  Administered 2021-08-14: 2 g via INTRAVENOUS
  Filled 2021-08-14: qty 50

## 2021-08-14 MED ORDER — APIXABAN 2.5 MG PO TABS
2.5000 mg | ORAL_TABLET | Freq: Two times a day (BID) | ORAL | Status: DC
  Administered 2021-08-14 – 2021-08-16 (×4): 2.5 mg via ORAL
  Filled 2021-08-14 (×4): qty 1

## 2021-08-14 MED ORDER — HYDROCOD POLI-CHLORPHE POLI ER 10-8 MG/5ML PO SUER
5.0000 mL | Freq: Two times a day (BID) | ORAL | Status: DC
  Administered 2021-08-14 – 2021-08-22 (×17): 5 mL via ORAL
  Filled 2021-08-14 (×17): qty 5

## 2021-08-14 NOTE — Progress Notes (Addendum)
Progress Note   Patient: Candace [name] QIW:[id number] DOB: May 23, 1950 DOA: 08/11/2021     3 DOS: the patient was seen and examined on 08/14/2021   Brief hospital course: HPI on admission:   Candace [name] is a 71 y.o. female with medical history significant for coronary artery disease status post CABG, history of unstable angina, anxiety disorder, chronic diastolic dysfunction CHF, paroxysmal A-fib, hypertension, depression and anxiety as well as diabetes mellitus who presented to the ER on 08/11/2021 with complaints of 3-day history progressive generalized weakness, myalgias, sore throat, headache and productive cough, chills, poor p.o. intake and diarrhea.  She reported testing positive for COVID-19 on a home test 08/10/2021.  Due to worsening weakness and inability to ambulate at home, EMS was called, patient was found to be hypotensive and transported to the ER.  Admitted for further evaluation management of COVID-19 infection.  No pneumonia or hypoxia.  Being treated with Paxlovid and supportive care.  Assessment and Plan: * ERDEY-82 Presented with complaints of profound generalized weakness, diarrhea, myalgias, sore throat, headache, cough productive of yellow phlegm and chills. Tested positive for COVID-19 at home on 5/15, COVID-19 PCR here positive as well.  Patient reports did receive COVID vaccines. Sepsis due to COVID-19-on arrival patient hypotensive and tachycardic. -- Continue Paxlovid -- Steroids deferred given hypoxia -- Supportive care with bronchodilators, antitussives, antipyretics as needed, vitamins, pulmonary hygiene -- Supplemental oxygen if needed to maintain sats above 90%  Atrial fibrillation with RVR ([place]) Afternoon 5/18: Patient heart rate sustaining in 120s.  Takes metoprolol tartrate 100 mg twice daily at home.  This has been held due to softer BPs. -- Resume oral Lopressor at 25 mg twice daily for now, hold off if MAP less than 65 -- IV Lopressor pushes as needed  if BP will tolerate -- If pressures too soft for use of beta-blockers, would start amiodarone  Paroxysmal atrial fibrillation (HCC) Overall rate controlled, tachycardia secondary to COVID infection. --Metoprolol on hold due to hypotension on admission, BPs remain soft. --Continue Eliquis   Chronic diastolic CHF (congestive heart failure) ([place]) Patient is overall euvolemic and well compensated.  Last echo from April 2023 showed EF 55 to 44%, grade 1 diastolic dysfunction. Due to hypotension on admission, Lasix, Entresto, metoprolol and nitrates were held.   Hypokalemia K3.3 this morning.   Replace with oral K-Cl 40 mEq x2.  Monitor BMP and replace K as needed.  Monitor Mg level as well.  Hypotension Most likely secondary to hypovolemia in the setting of nausea, vomiting, diarrhea and poor p.o. intake. On arrival, systolic BP was low 81E, responded well to IV fluid bolus. -- Maintain MAP above 65 with fluids if needed -- Monitor volume status closely if requiring fluids   Essential hypertension Antihypertensives are on hold due to soft BPs.  Resume when indicated  Positive blood culture 1 of 4 bottles (anaerobic) admission blood culture positive for staph species.  Contaminant suspected. Follow cultures to final result. Repeat blood culture.  Depression with anxiety Continue Zoloft and Xanax  Type 2 diabetes mellitus with diabetic polyneuropathy, with long-term current use of insulin (HCC) On reduced dose of basal insulin, 10 units at bedtime.  Sliding scale NovoLog.  Adjust insulin for goal 140-180.  Obesity (BMI 30-39.9) Body mass index is 30.18 kg/m. Complicates overall care and prognosis.  Recommend lifestyle modifications including physical activity and diet for weight loss and overall long-term health.         Subjective: Patient awake resting in bed,  daughter at bedside when seen on rounds today.  Patient reports ongoing coughing spells and time rib cage chest  discomfort with this.  Difficulty sleeping due to cough last night.  She has not been up yet today so not sure if she is dizzy like yesterday.  Continues to feel extremely fatigued and much more weak than baseline.  Physical Exam: Vitals:   08/13/21 2025 08/14/21 0103 08/14/21 0812 08/14/21 1132  BP: 99/84 105/72 120/83 114/74  Pulse: (!) 124 98 95 94  Resp: '19 18 17 18  '$ Temp: 98.2 F (36.8 C) 98 F (36.7 C) 97.9 F (36.6 C) 98.1 F (36.7 C)  TempSrc:   Oral   SpO2: 100% 92% 95% 100%  Weight:      Height:       General exam: awake, alert, no acute distress HEENT: Hearing grossly normal, moist mucous membranes Respiratory system: Lungs clear, intermittent coughing spells mostly nonproductive normal respiratory effort, on room air. Cardiovascular system: RRR, no significant peripheral edema.,  2+ radial pulses. Gastrointestinal system: soft, tender nondistended abdomen Central nervous system: A&O x4. no gross focal neurologic deficits, normal speech but voice is a bit tremulous Extremities: moves all, no edema, normal tone Psychiatry: normal mood, congruent affect, judgement and insight appear normal   Data Reviewed:  Notable labs: Glucose 180, calcium 8.6 with albumin 3.0, total protein 6.1, CRP improved 3.1.  CBC is unremarkable.  CBGs at goal or better 105, 152 today    Family Communication: daughter [name] at bedside on rounds today 5/19  Disposition: Status is: Inpatient Remains inpatient appropriate because: Severity of illness with profound weakness, symptomatic orthostasis yesterday afternoon/evening and A-fib RVR adjusting rate control as BP allows.  Anticipate discharge home in 2 to 3 days pending further clinical improvement.   Planned Discharge Destination: Home    Time spent: 35 minutes  Author: Ezekiel Slocumb, DO 08/14/2021 1:13 PM  For on call review [email or web address].

## 2021-08-14 NOTE — Assessment & Plan Note (Signed)
Body mass index is 30.18 kg/m. Complicates overall care and prognosis.  Recommend lifestyle modifications including physical activity and diet for weight loss and overall long-term health.

## 2021-08-14 NOTE — Care Management Important Message (Signed)
Important Message  Patient Details  Name: Candace [name] MRN: [id number] Date of Birth: 10/14/1950   Medicare Important Message Given:  Other (see comment)  Patient is in an isolation room so I called [phone]) to review her Important Message from Medicare with her but there was no answer. Will try again.   Juliann Pulse A Almon Whitford 08/14/2021, 1:16 PM

## 2021-08-14 NOTE — TOC Initial Note (Signed)
Transition of Care [facility]) - Initial/Assessment Note    Patient Details  Name: Candace [name] MRN: [id number] Date of Birth: [birth year]  [facility] CM/SW Contact:    Eileen Stanford, LCSW Phone Number: 08/14/2021, 12:16 PM  Clinical Narrative: CSW spoke with pt's daughter [name]. [name] states pt wants to dc home by herself however, pt's daughter states she is going to have to be moving better. Pt's daughter would like for PT to reeval her to determine if pt will be able to dc home or will need to go to SNF. CSW did explain pt was max assist and PT was recommending SNF per last note.                   Expected Discharge Plan: [place] Barriers to Discharge: Continued Medical Work up   Patient Goals and CMS Choice Patient states their goals for this hospitalization and ongoing recovery are:: for pt to get better before returning home   Choice offered to / list presented to : Adult Children  Expected Discharge Plan and Services Expected Discharge Plan: [place] Acute Care Choice: [place] arrangements for the past 2 months: Single Family Home                                      Prior Living Arrangements/Services Living arrangements for the past 2 months: Single Family Home Lives with:: Self Patient language and need for interpreter reviewed:: Yes Do you feel safe going back to the place where you live?: Yes      Need for Family Participation in Patient Care: Yes (Comment) Care giver support system in place?: Yes (comment)   Criminal Activity/Legal Involvement Pertinent to Current Situation/Hospitalization: No - Comment as needed  Activities of Daily Living Home Assistive Devices/Equipment: Grab bars in shower, Grab bars around toilet, Walker (specify type), Shower chair with back ADL Screening (condition at time of admission) Patient's cognitive ability adequate to safely complete daily activities?:  Yes Is the patient deaf or have difficulty hearing?: Yes Does the patient have difficulty seeing, even when wearing glasses/contacts?: Yes Does the patient have difficulty concentrating, remembering, or making decisions?: No Patient able to express need for assistance with ADLs?: Yes Does the patient have difficulty dressing or bathing?: No Independently performs ADLs?: No Communication: Independent Dressing (OT): Independent Grooming: Independent Feeding: Independent Bathing: Independent Toileting: Independent In/Out Bed: Independent Walks in Home: Needs assistance Is this a change from baseline?: Change from baseline, expected to last >3 days Does the patient have difficulty walking or climbing stairs?: Yes Weakness of Legs: Both Weakness of Arms/Hands: Both  Permission Sought/Granted Permission sought to share information with : Family Supports    Share Information with NAME: [name]     Permission granted to share info w Relationship: daughter     Emotional Assessment Appearance:: Appears stated age Attitude/Demeanor/Rapport: Engaged Affect (typically observed): Accepting Orientation: : Oriented to Situation, Oriented to  Time, Oriented to Place, Oriented to Self Alcohol / Substance Use: Not Applicable Psych Involvement: No (comment)  Admission diagnosis:  Hypoxia [R09.02] Hypotension, unspecified hypotension type [I95.9] Dyspnea, unspecified type [R06.00] COVID [U07.1] COVID-19 [U07.1] Patient Active Problem List   Diagnosis Date Noted   Positive blood culture 08/13/2021   Atrial fibrillation with RVR ([place]) 08/13/2021   Hypokalemia 08/12/2021   COVID-19 08/11/2021  Hypotension 08/11/2021   Left hip pain 06/30/2021   Paroxysmal atrial fibrillation ([place]) 07/10/2020   History of coronary artery bypass graft 07/10/2020   Vertigo    Unsteady gait    Acute bronchitis 03/07/2019   Esophageal dysphagia    Stomach irritation    Gastric polyp    Esophageal lump     Hx of colonic polyp    Polyp of colon    Diverticulosis of large intestine without diverticulitis    PSVT (paroxysmal supraventricular tachycardia) ([place]) 10/03/2018   Abnormal ECG 07/05/2018   Tachycardia 03/27/2018   Pain in limb 02/28/2018   Chronic diastolic CHF (congestive heart failure) ([place]) 11/03/2017   TIA (transient ischemic attack) 11/03/2017   COPD suggested by initial evaluation ([place]) 08/23/2017   Acute and chronic respiratory failure with hypoxia ([place]) 08/23/2017   Postoperative state 08/15/2017   Incidental pulmonary nodule 08/01/2017   Non-ST elevation myocardial infarction (NSTEMI), subendocardial infarction, subsequent episode of care ([place]) 06/01/2017   B12 deficiency 12/01/2016   Vitamin D deficiency 11/04/2016   Depression with anxiety 09/30/2016   AF (paroxysmal atrial fibrillation) ([place]) 09/30/2016   Resting tremor 09/30/2016   Mild obstructive sleep apnea 09/30/2016   Headache 08/18/2016   Unstable angina ([place]) 08/03/2016   Type 2 diabetes mellitus with diabetic polyneuropathy, with long-term current use of insulin (HCC) 08/03/2016   Chronic daily headache 05/05/2016   Asthma 11/11/2015   S/P CABG x 4 11/10/2015   Presence of aortocoronary bypass graft 11/10/2015   Coronary artery disease 11/07/2015   Carotid artery narrowing 02/08/2014   Chest pain 02/08/2014   Mixed hyperlipidemia 02/08/2014   Bilateral carotid artery stenosis 02/08/2014   Atypical chest pain 02/07/2014   Essential hypertension 02/07/2014   Awareness of heartbeats 02/07/2014   D (diarrhea) 10/05/2013   Gastroesophageal reflux disease 10/05/2013   PCP:  [name]., MD Pharmacy:   [facility] - [place], [place] - [address] [place] [address] Phone: [phone] Fax: [phone]  [id number] [place] [id number]:  [phone]: [phone] [id number] [place] [address] [place]: [phone]: [phone]   Social Determinants of Health (SDOH) Interventions    Readmission Risk Interventions     View : No data to display.

## 2021-08-15 DIAGNOSIS — U071 COVID-19: Secondary | ICD-10-CM | POA: Diagnosis not present

## 2021-08-15 LAB — CULTURE, BLOOD (ROUTINE X 2): Special Requests: ADEQUATE

## 2021-08-15 LAB — TROPONIN I (HIGH SENSITIVITY)
Troponin I (High Sensitivity): 10 ng/L (ref <18)
Troponin I (High Sensitivity): 8 ng/L (ref <18)

## 2021-08-15 LAB — GLUCOSE, CAPILLARY
Glucose-Capillary: 95 mg/dL (ref 70–99)
Glucose-Capillary: 177 mg/dL — ABNORMAL HIGH (ref 70–99)
Glucose-Capillary: 156 mg/dL — ABNORMAL HIGH (ref 70–99)
Glucose-Capillary: 189 mg/dL — ABNORMAL HIGH (ref 70–99)

## 2021-08-15 LAB — COMPREHENSIVE METABOLIC PANEL
ALT: 17 U/L (ref 0–44)
AST: 29 U/L (ref 15–41)
Albumin: 3.1 g/dL — ABNORMAL LOW (ref 3.5–5.0)
Alkaline Phosphatase: 74 U/L (ref 38–126)
Anion gap: 9 (ref 5–15)
BUN: 12 mg/dL (ref 8–23)
CO2: 27 mmol/L (ref 22–32)
Calcium: 8.9 mg/dL (ref 8.9–10.3)
Chloride: 106 mmol/L (ref 98–111)
Creatinine, Ser: 0.56 mg/dL (ref 0.44–1.00)
GFR, Estimated: >60 mL/min (ref >60)
Glucose, Bld: 86 mg/dL (ref 70–99)
Potassium: 4.3 mmol/L (ref 3.5–5.1)
Sodium: 142 mmol/L (ref 135–145)
Total Bilirubin: 0.5 mg/dL (ref 0.3–1.2)
Total Protein: 6.4 g/dL — ABNORMAL LOW (ref 6.5–8.1)

## 2021-08-15 LAB — MAGNESIUM: Magnesium: 2 mg/dL (ref 1.7–2.4)

## 2021-08-15 LAB — BRAIN NATRIURETIC PEPTIDE: B Natriuretic Peptide: 82.4 pg/mL (ref 0.0–100.0)

## 2021-08-15 LAB — C-REACTIVE PROTEIN: CRP: 1.7 mg/dL — ABNORMAL HIGH (ref <1.0)

## 2021-08-15 MED ORDER — MIDODRINE HCL 5 MG PO TABS
5.0000 mg | ORAL_TABLET | Freq: Two times a day (BID) | ORAL | Status: DC
Start: 2021-08-15 — End: 2021-08-21
  Administered 2021-08-15 – 2021-08-21 (×10): 5 mg via ORAL
  Filled 2021-08-15 (×11): qty 1

## 2021-08-15 MED ORDER — METOPROLOL TARTRATE 25 MG PO TABS
12.5000 mg | ORAL_TABLET | Freq: Two times a day (BID) | ORAL | Status: DC
  Administered 2021-08-15 – 2021-08-22 (×14): 12.5 mg via ORAL
  Filled 2021-08-15 (×14): qty 1

## 2021-08-15 MED ORDER — MIDODRINE HCL 5 MG PO TABS
5.0000 mg | ORAL_TABLET | Freq: Two times a day (BID) | ORAL | Status: DC
Start: 2021-08-15 — End: 2021-08-15

## 2021-08-15 NOTE — Progress Notes (Signed)
EKG showed NSR with left bundle branch block, + orthostatic VS-complaints of "floor moving".  MD notified and new medication was initiated.

## 2021-08-15 NOTE — Progress Notes (Signed)
Progress Note   Patient: Candace [name] TDH:[id number] DOB: [birth year] DOA: 08/11/2021     4 DOS: the patient was seen and examined on 08/15/2021   Brief hospital course: HPI on admission:   Candace [name] is a 71 y.o. female with medical history significant for coronary artery disease status post CABG, history of unstable angina, anxiety disorder, chronic diastolic dysfunction CHF, paroxysmal A-fib, hypertension, depression and anxiety as well as diabetes mellitus who presented to the ER on 08/11/2021 with complaints of 3-day history progressive generalized weakness, myalgias, sore throat, headache and productive cough, chills, poor p.o. intake and diarrhea.  She reported testing positive for COVID-19 on a home test 08/10/2021.  Due to worsening weakness and inability to ambulate at home, EMS was called, patient was found to be hypotensive and transported to the ER.  Admitted for further evaluation management of COVID-19 infection.  No pneumonia or hypoxia.  Being treated with Paxlovid and supportive care.  Assessment and Plan: * [id number] Presented with complaints of profound generalized weakness, diarrhea, myalgias, sore throat, headache, cough productive of yellow phlegm and chills. Tested positive for COVID-19 at home on 5/15, COVID-19 PCR here positive as well.  Patient reports did receive COVID vaccines. Sepsis due to COVID-19-on arrival patient hypotensive and tachycardic. -- Continue Paxlovid -- Steroids deferred given hypoxia -- Supportive care with bronchodilators, antitussives, antipyretics as needed, vitamins, pulmonary hygiene -- Supplemental oxygen if needed to maintain sats above 90%  Atrial fibrillation with RVR ([place]) Afternoon 5/18: Patient heart rate sustaining in 120s.  Takes metoprolol tartrate 100 mg twice daily at home.  This has been held due to softer BPs. -- Resume oral Lopressor at 25 mg twice daily for now, hold off if MAP less than 65 -- IV Lopressor pushes as needed  if BP will tolerate -- If pressures too soft for use of beta-blockers, would start amiodarone  Paroxysmal atrial fibrillation (HCC) Overall rate controlled, tachycardia secondary to COVID infection. -- Metoprolol has been on hold due to hypotension and orthostatic symptoms.  Started midodrine, will resume reduced dose of metoprolol 12.5 mg twice daily. --Continue Eliquis   Chronic diastolic CHF (congestive heart failure) ([place]) Patient is overall euvolemic and well compensated.  Last echo from April 2023 showed EF 55 to 12%, grade 1 diastolic dysfunction. Due to hypotension on admission, Lasix, Entresto, metoprolol and nitrates were held.   Hypokalemia K3.3 this morning.   Replace with oral K-Cl 40 mEq x2.  Monitor BMP and replace K as needed.  Monitor Mg level as well.  Orthostatic hypotension Initially, baseline BPs were low most likely secondary to hypovolemia in the setting of nausea, vomiting, diarrhea and poor p.o. intake.  On arrival, systolic BP was low 24M, responded well to IV fluid bolus.  5/19-20: Patient resting BPs have improved but somewhat soft.  Does have symptomatic orthostatic hypotension, unable to tolerate standing due to dizziness  --Start midodrine gram twice daily with meals for now --Daily orthostatic vitals -- Maintain MAP above 65 with fluids if needed -- Monitor volume status closely if requiring fluids   Essential hypertension Antihypertensives are on hold due to soft BPs.  Resume when indicated  Positive blood culture 1 of 4 bottles (anaerobic) admission blood culture positive for staph species.  Contaminant suspected. Follow cultures to final result. Repeat blood culture.  Depression with anxiety Continue Zoloft and Xanax  Type 2 diabetes mellitus with diabetic polyneuropathy, with long-term current use of insulin (HCC) On reduced dose of basal insulin, 10 units  at bedtime.  Sliding scale NovoLog.  Adjust insulin for goal 140-180.  Obesity (BMI  30-39.9) Body mass index is 30.18 kg/m. Complicates overall care and prognosis.  Recommend lifestyle modifications including physical activity and diet for weight loss and overall long-term health.   Coronary artery disease Overall stable.  Having some atypical chest pain secondary to severe coughing spells.  See atypical chest pain  Atypical chest pain Patient has had anterior central chest discomfort associated with her significant coughing with COVID. This morning, patient had sudden onset of more severe and internal chest pain/pressure during encounter on rounds. -- Check troponin, BNP, EKG -- Monitor closely        Subjective: Patient awake resting in bed, daughter at bedside when seen on rounds today.  Daughter had just finished helping patient with bath.  When she was up on her feet was again dizzy this morning.  Orthostatic vitals positive.  She reports continuing profound fatigue and generalized weakness.  Still having coughing fits and gets very short of breath with these.  Ports slept okay last night.  Physical Exam: Vitals:   08/14/21 2045 08/15/21 0017 08/15/21 0606 08/15/21 0908  BP: 109/74 129/73 125/84 109/61  Pulse: 82 82 82 76  Resp: '18 18 20 18  '$ Temp: 98.6 F (37 C) 97.8 F (36.6 C) (!) 97.4 F (36.3 C) (!) 97.4 F (36.3 C)  TempSrc:   Oral   SpO2: 99% 97% 96% 97%  Weight:      Height:       General exam: awake, alert, no acute distress HEENT: Hearing grossly normal, moist mucous membranes Respiratory system: Lungs clear bilaterally, no wheezing, continues to have nonproductive sounding coughing spells that lead to shortness of breath. Cardiovascular system: RRR, no peripheral edema Central nervous system: A&O x4. no gross focal neurologic deficits, normal speech but voice is a bit tremulous Extremities: moves all, no edema, normal tone Psychiatry: normal mood, congruent affect, judgement and insight appear normal   Data Reviewed:  Notable labs:  Notable labs: Albumin 3.1, total protein 6.4, CRP improving 1.7.  Troponin 10.  BNP 82.4 .  EKG normal sinus rhythm 78 bpm, LBBB (not new).    Family Communication: daughter [name] at bedside on rounds today 5/20  Disposition: Status is: Inpatient Remains inpatient appropriate because: Severity of illness with profound weakness, symptomatic orthostasis.  Expect will need SNF for rehab, this will require 10 day isolation prior to d/c.    Planned Discharge Destination: Home    Time spent: 35 minutes  Author: Ezekiel Slocumb, DO 08/15/2021 2:18 PM  For on call review [email or web address].

## 2021-08-15 NOTE — Assessment & Plan Note (Addendum)
Overall stable.  Having some atypical chest pain secondary to severe coughing spells.  See atypical chest pain -- Continue Plavix, Zetia, metoprolol

## 2021-08-15 NOTE — Assessment & Plan Note (Addendum)
Patient has had anterior central chest discomfort associated with her significant coughing with COVID. Morning of 5/20, patient had sudden onset of more severe and internal chest pain/pressure during encounter on rounds. -- EKG normal sinus rhythm with LBBB (not new) --Troponin x2 normal -- Monitor closely

## 2021-08-16 DIAGNOSIS — U071 COVID-19: Secondary | ICD-10-CM | POA: Diagnosis not present

## 2021-08-16 DIAGNOSIS — Z87898 Personal history of other specified conditions: Secondary | ICD-10-CM

## 2021-08-16 LAB — MAGNESIUM: Magnesium: 2 mg/dL (ref 1.7–2.4)

## 2021-08-16 LAB — CULTURE, BLOOD (ROUTINE X 2)
Culture: NO GROWTH
Special Requests: ADEQUATE

## 2021-08-16 LAB — COMPREHENSIVE METABOLIC PANEL
ALT: 39 U/L (ref 0–44)
AST: 61 U/L — ABNORMAL HIGH (ref 15–41)
Albumin: 3.3 g/dL — ABNORMAL LOW (ref 3.5–5.0)
Alkaline Phosphatase: 85 U/L (ref 38–126)
Anion gap: 9 (ref 5–15)
BUN: 19 mg/dL (ref 8–23)
CO2: 28 mmol/L (ref 22–32)
Calcium: 9 mg/dL (ref 8.9–10.3)
Chloride: 103 mmol/L (ref 98–111)
Creatinine, Ser: 0.66 mg/dL (ref 0.44–1.00)
GFR, Estimated: >60 mL/min (ref >60)
Glucose, Bld: 99 mg/dL (ref 70–99)
Potassium: 4 mmol/L (ref 3.5–5.1)
Sodium: 140 mmol/L (ref 135–145)
Total Bilirubin: 0.6 mg/dL (ref 0.3–1.2)
Total Protein: 6.6 g/dL (ref 6.5–8.1)

## 2021-08-16 LAB — GLUCOSE, CAPILLARY
Glucose-Capillary: 103 mg/dL — ABNORMAL HIGH (ref 70–99)
Glucose-Capillary: 180 mg/dL — ABNORMAL HIGH (ref 70–99)
Glucose-Capillary: 114 mg/dL — ABNORMAL HIGH (ref 70–99)
Glucose-Capillary: 184 mg/dL — ABNORMAL HIGH (ref 70–99)

## 2021-08-16 LAB — C-REACTIVE PROTEIN: CRP: 1 mg/dL — ABNORMAL HIGH (ref <1.0)

## 2021-08-16 MED ORDER — APIXABAN 5 MG PO TABS
5.0000 mg | ORAL_TABLET | Freq: Two times a day (BID) | ORAL | Status: DC
  Administered 2021-08-17 – 2021-08-22 (×11): 5 mg via ORAL
  Filled 2021-08-16 (×11): qty 1

## 2021-08-16 MED ORDER — MECLIZINE HCL 25 MG PO TABS: 25.0000 mg | ORAL_TABLET | Freq: Three times a day (TID) | ORAL | Status: DC | PRN

## 2021-08-16 MED ORDER — APIXABAN 2.5 MG PO TABS
2.5000 mg | ORAL_TABLET | Freq: Two times a day (BID) | ORAL | Status: AC
  Administered 2021-08-16: 2.5 mg via ORAL
  Filled 2021-08-16: qty 1

## 2021-08-16 MED ORDER — OXYCODONE HCL 5 MG PO TABS
2.5000 mg | ORAL_TABLET | ORAL | Status: DC | PRN
Start: 2021-08-16 — End: 2021-08-17
  Administered 2021-08-16 – 2021-08-17 (×2): 2.5 mg via ORAL
  Filled 2021-08-16 (×2): qty 1

## 2021-08-16 NOTE — NC FL2 (Signed)
[place] LEVEL OF CARE SCREENING TOOL     IDENTIFICATION  Patient Name: Candace [name] Birthdate: Jul 21, 1950 Sex: female Admission Date (Current Location): 08/11/2021  [facility] and [place] Number:  Engineering geologist and Address:  [facility], [address] [place], [place] [address]      Provider Number: [id number]  Attending Physician Name and Address:  Ezekiel Slocumb, DO  Relative Name and Phone Number:  Gus Height [phone]    Current Level of Care: Hospital Recommended Level of Care: [place] Prior Approval Number:    Date Approved/Denied: 08/16/21 PASRR Number: [id number]  Discharge Plan: SNF    Current Diagnoses: Patient Active Problem List   Diagnosis Date Noted   Obesity (BMI 30-39.9) 08/14/2021   Positive blood culture 08/13/2021   Atrial fibrillation with RVR ([place]) 08/13/2021   Hypokalemia 08/12/2021   COVID-19 08/11/2021   Orthostatic hypotension 08/11/2021   Left hip pain 06/30/2021   Paroxysmal atrial fibrillation ([place]) 07/10/2020   History of coronary artery bypass graft 07/10/2020   Vertigo    Unsteady gait    Acute bronchitis 03/07/2019   Esophageal dysphagia    Stomach irritation    Gastric polyp    Esophageal lump    Hx of colonic polyp    Polyp of colon    Diverticulosis of large intestine without diverticulitis    PSVT (paroxysmal supraventricular tachycardia) ([place]) 10/03/2018   Abnormal ECG 07/05/2018   Tachycardia 03/27/2018   Pain in limb 02/28/2018   Chronic diastolic CHF (congestive heart failure) ([place]) 11/03/2017   TIA (transient ischemic attack) 11/03/2017   COPD suggested by initial evaluation ([place]) 08/23/2017   Acute and chronic respiratory failure with hypoxia ([place]) 08/23/2017   Postoperative state 08/15/2017   Incidental pulmonary nodule 08/01/2017   Non-ST elevation myocardial infarction (NSTEMI), subendocardial infarction, subsequent episode of care  ([place]) 06/01/2017   B12 deficiency 12/01/2016   Vitamin D deficiency 11/04/2016   Depression with anxiety 09/30/2016   AF (paroxysmal atrial fibrillation) ([place]) 09/30/2016   Resting tremor 09/30/2016   Mild obstructive sleep apnea 09/30/2016   Headache 08/18/2016   Unstable angina ([place]) 08/03/2016   Type 2 diabetes mellitus with diabetic polyneuropathy, with long-term current use of insulin (HCC) 08/03/2016   Chronic daily headache 05/05/2016   Asthma 11/11/2015   S/P CABG x 4 11/10/2015   Presence of aortocoronary bypass graft 11/10/2015   Coronary artery disease 11/07/2015   Carotid artery narrowing 02/08/2014   Chest pain 02/08/2014   Mixed hyperlipidemia 02/08/2014   Bilateral carotid artery stenosis 02/08/2014   Atypical chest pain 02/07/2014   Essential hypertension 02/07/2014   Awareness of heartbeats 02/07/2014   D (diarrhea) 10/05/2013   Gastroesophageal reflux disease 10/05/2013    Orientation RESPIRATION BLADDER Height & Weight     Self, Time, Situation, Place  Normal External catheter Weight: 74.8 kg Height:  '5\' 2"'$  (157.5 cm)  BEHAVIORAL SYMPTOMS/MOOD NEUROLOGICAL BOWEL NUTRITION STATUS      Continent Diet  AMBULATORY STATUS COMMUNICATION OF NEEDS Skin   Limited Assist   Normal                       Personal Care Assistance Level of Assistance  Bathing, Dressing Bathing Assistance: Limited assistance   Dressing Assistance: Limited assistance     Functional Limitations Info             SPECIAL CARE FACTORS FREQUENCY  OT (By licensed OT), PT (By  licensed PT)     PT Frequency: 5 X weekly OT Frequency: 5 X weekly            Contractures Contractures Info: Not present    Additional Factors Info                  Current Medications (08/16/2021):  This is the current hospital active medication list Current Facility-Administered Medications  Medication Dose Route Frequency Provider Last Rate Last Admin   acetaminophen (TYLENOL) tablet  650 mg  650 mg Oral Q6H PRN Agbata, Tochukwu, MD   650 mg at 08/16/21 0926   ALPRAZolam Duanne Moron) tablet 0.25 mg  0.25 mg Oral Q8H PRN Agbata, Tochukwu, MD   0.25 mg at 08/13/21 2326   apixaban (ELIQUIS) tablet 2.5 mg  2.5 mg Oral BID Oswald Hillock, RPH       Followed by   Derrill Memo ON 08/17/2021] apixaban (ELIQUIS) tablet 5 mg  5 mg Oral BID Eleonore Chiquito S, RPH       ascorbic acid (VITAMIN C) tablet 500 mg  500 mg Oral Daily Agbata, Tochukwu, MD   500 mg at 08/16/21 3875   chlorpheniramine-HYDROcodone 10-8 MG/5ML suspension 5 mL  5 mL Oral Q12H Nicole Kindred A, DO   5 mL at 08/16/21 0920   clopidogrel (PLAVIX) tablet 75 mg  75 mg Oral Daily Agbata, Tochukwu, MD   75 mg at 08/16/21 6433   dextromethorphan-guaiFENesin ([place] DM) 30-600 MG per 12 hr tablet 1 tablet  1 tablet Oral BID Nicole Kindred A, DO   1 tablet at 08/16/21 2951   diclofenac Sodium (VOLTAREN) 1 % topical gel 2 g  2 g Topical QID Nicole Kindred A, DO   2 g at 08/16/21 8841   ezetimibe (ZETIA) tablet 10 mg  10 mg Oral Daily Agbata, Tochukwu, MD   10 mg at 08/16/21 0921   feeding supplement (ENSURE ENLIVE / ENSURE PLUS) liquid 237 mL  237 mL Oral BID BM Nicole Kindred A, DO   237 mL at 08/16/21 1234   guaiFENesin-dextromethorphan (ROBITUSSIN DM) 100-10 MG/5ML syrup 10 mL  10 mL Oral Q4H PRN Agbata, Tochukwu, MD   10 mL at 08/16/21 0618   insulin aspart (novoLOG) injection 0-15 Units  0-15 Units Subcutaneous TID WC Agbata, Tochukwu, MD   3 Units at 08/16/21 1236   insulin glargine-yfgn (SEMGLEE) injection 10 Units  10 Units Subcutaneous QHS Agbata, Tochukwu, MD   10 Units at 08/15/21 2243   levalbuterol (XOPENEX HFA) inhaler 1-2 puff  1-2 puff Inhalation Q6H WA Nicole Kindred A, DO   1 puff at 08/16/21 0935   metoprolol tartrate (LOPRESSOR) injection 5 mg  5 mg Intravenous Q6H PRN Nicole Kindred A, DO       metoprolol tartrate (LOPRESSOR) tablet 12.5 mg  12.5 mg Oral BID Nicole Kindred A, DO   12.5 mg at 08/16/21 0925    midodrine (PROAMATINE) tablet 5 mg  5 mg Oral BID WC Nicole Kindred A, DO   5 mg at 08/16/21 6606   multivitamin with minerals tablet 1 tablet  1 tablet Oral Daily Nicole Kindred A, DO   1 tablet at 08/16/21 0923   nitroGLYCERIN (NITROSTAT) SL tablet 0.4 mg  0.4 mg Sublingual Q5 min PRN Agbata, Tochukwu, MD       ondansetron (ZOFRAN) tablet 4 mg  4 mg Oral Q6H PRN Agbata, Tochukwu, MD   4 mg at 08/14/21 1053   Or   ondansetron (ZOFRAN) injection 4 mg  4 mg Intravenous Q6H PRN Agbata, Tochukwu, MD   4 mg at 08/11/21 2032   pantoprazole (PROTONIX) EC tablet 20 mg  20 mg Oral BID Agbata, Tochukwu, MD   20 mg at 08/16/21 0921   phenol (CHLORASEPTIC) mouth spray 1 spray  1 spray Mouth/Throat PRN Nicole Kindred A, DO   1 spray at 08/16/21 1235   sertraline (ZOLOFT) tablet 100 mg  100 mg Oral Daily Agbata, Tochukwu, MD   100 mg at 08/16/21 0301   zinc sulfate capsule 220 mg  220 mg Oral Daily Agbata, Tochukwu, MD   220 mg at 08/16/21 3143     Discharge Medications: Please see discharge summary for a list of discharge medications.  Relevant Imaging Results:  Relevant Lab Results:   Additional Information    Zigmund Daniel, Dorian Pod, RN

## 2021-08-16 NOTE — TOC Progression Note (Signed)
Transition of Care [facility]) - Progression Note    Patient Details  Name: Candace [name] MRN: [id number] Date of Birth: 03/26/1951  [facility]) CM/SW Contact  Zigmund Daniel Dorian Pod, RN Phone Number:[phone] 08/16/2021, 2:19 PM  Clinical Narrative:    Spoke with daughter concerning SNF recommendation. Daughter [name]) receptive to [employer] and [employer] with the exception of two facilities not included in the bed-search. Started bed search and currently awaiting an offers. Addressed all inquires from the daughter with no other issues or request at this time.  TOC will contact the daughter [name]) with available bed offers for SNF placement.   Expected Discharge Plan: [place] Barriers to Discharge: Continued Medical Work up  Expected Discharge Plan and Services Expected Discharge Plan: [place] Choice: [place] arrangements for the past 2 months: Single Family Home                                       Social Determinants of Health (SDOH) Interventions    Readmission Risk Interventions     View : No data to display.

## 2021-08-16 NOTE — Assessment & Plan Note (Signed)
While patient does have orthostatic hypotension and associated dizziness, also has a history of vertigo that has responded well to meclizine in the past.  Some of her dizziness complaints sound more related to vertigo than orthostasis, and she reports similar to when she has taken meclizine with relief in the past. -- Start meclizine 3 times daily as needed for dizziness/vertigo and related to orthostatic hypotension

## 2021-08-16 NOTE — Progress Notes (Signed)
Progress Note   Patient: Candace [name] DOB: 1951-01-24 DOA: 08/11/2021     5 DOS: the patient was seen and examined on 08/16/2021   Brief hospital course: HPI on admission:   Candace [name] is a 71 y.o. female with medical history significant for coronary artery disease status post CABG, history of unstable angina, anxiety disorder, chronic diastolic dysfunction CHF, paroxysmal A-fib, hypertension, depression and anxiety as well as diabetes mellitus who presented to the ER on 08/11/2021 with complaints of 3-day history progressive generalized weakness, myalgias, sore throat, headache and productive cough, chills, poor p.o. intake and diarrhea.  She reported testing positive for COVID-19 on a home test 08/10/2021.  Due to worsening weakness and inability to ambulate at home, EMS was called, patient was found to be hypotensive and transported to the ER.  Admitted for further evaluation management of COVID-19 infection.  No pneumonia or hypoxia.  Being treated with Paxlovid and supportive care.  Assessment and Plan: * [id number] Presented with complaints of profound generalized weakness, diarrhea, myalgias, sore throat, headache, cough productive of yellow phlegm and chills. Tested positive for COVID-19 at home on 5/15, COVID-19 PCR here positive as well.  Patient reports did receive COVID vaccines. Sepsis due to COVID-19-on arrival patient hypotensive and tachycardic. -- Continue Paxlovid -- Steroids deferred given hypoxia -- Supportive care with bronchodilators, antitussives, antipyretics as needed, vitamins, pulmonary hygiene -- Supplemental oxygen if needed to maintain sats above 90%  Atrial fibrillation with RVR ([place]) Afternoon 5/18: Patient heart rate sustaining in 120s.  Takes metoprolol tartrate 100 mg twice daily at home.  This has been held due to softer BPs. -- Continue Lopressor at 25 mg twice daily for now, hold if MAP < 65 -- IV Lopressor pushes PRN if BP will  tolerate  Paroxysmal atrial fibrillation (HCC) Overall rate controlled, tachycardia secondary to COVID infection. -- Metoprolol has been on hold due to hypotension and orthostatic symptoms.  Started midodrine, will resume reduced dose of metoprolol 12.5 mg twice daily. --Continue Eliquis   Chronic diastolic CHF (congestive heart failure) ([place]) Patient is overall euvolemic and well compensated.  Last echo from April 2023 showed EF 55 to 43%, grade 1 diastolic dysfunction. Due to hypotension on admission, Lasix, Entresto, metoprolol and nitrates were held.   Hypokalemia K3.3 this morning.   Replace with oral K-Cl 40 mEq x2.  Monitor BMP and replace K as needed.  Monitor Mg level as well.  Orthostatic hypotension Initially, baseline BPs were low most likely secondary to hypovolemia in the setting of nausea, vomiting, diarrhea and poor p.o. intake.  On arrival, systolic BP was low 15Q, responded well to IV fluid bolus.  5/19-20: Patient resting BPs have improved but somewhat soft.  Does have symptomatic orthostatic hypotension, unable to tolerate standing due to dizziness  --Start midodrine gram twice daily with meals for now --Daily orthostatic vitals -- Maintain MAP above 65 with fluids if needed -- Monitor volume status closely if requiring fluids   Essential hypertension Antihypertensives are on hold due to soft BPs.  Resume when indicated  Positive blood culture 1 of 4 bottles (anaerobic) admission blood culture positive for staph species.  Contaminant suspected. Follow cultures to final result. Repeat blood culture.  Depression with anxiety Continue Zoloft and Xanax  Type 2 diabetes mellitus with diabetic polyneuropathy, with long-term current use of insulin (HCC) On reduced dose of basal insulin, 10 units at bedtime.  Sliding scale NovoLog.  Adjust insulin for goal 140-180.  History of vertigo  SheWhile patient does have orthostatic hypotension and associated dizziness,  also has a history of vertigo that has responded well to meclizine in the past.  Some of her dizziness complaints sound more related to vertigo than orthostasis, and she reports similar to when she has taken meclizine with relief in the past. -- Start meclizine 3 times daily as needed for dizziness/vertigo and related to orthostatic hypotension  Obesity (BMI 30-39.9) Body mass index is 30.18 kg/m. Complicates overall care and prognosis.  Recommend lifestyle modifications including physical activity and diet for weight loss and overall long-term health.   Generalized weakness PT/OT recommending SNF at this time.  TOC following for placement.  Patient will require 10-day isolation.  Prior to SNF discharge. -- Fall precautions --Out of bed to chair at least twice daily -- Continue PT/OT  Coronary artery disease Overall stable.  Having some atypical chest pain secondary to severe coughing spells.  See atypical chest pain  Atypical chest pain Patient has had anterior central chest discomfort associated with her significant coughing with COVID. Morning of 5/20, patient had sudden onset of more severe and internal chest pain/pressure during encounter on rounds. -- EKG normal sinus rhythm with LBBB (not new) --Troponin x2 normal -- Monitor closely        Subjective: Patient awake resting in bed, daughter at bedside when seen on rounds today.  Patient reports feeling a little bit better today, less coughing, only asked for headache medication once.  Sore throat is better but still comes and goes.  She had an episode of shooting pain under her left breast earlier today but has resolved.  She denies any leg swelling or pain  Physical Exam: Vitals:   08/16/21 0008 08/16/21 0608 08/16/21 0743 08/16/21 1152  BP: 122/61 (!) 101/58 112/71 108/61  Pulse: 70 73 77 71  Resp: '18 20 18 18  '$ Temp: 97.8 F (36.6 C) 97.8 F (36.6 C) 98 F (36.7 C) 98.2 F (36.8 C)  TempSrc:   Oral Oral  SpO2: 98%  98% 94% 96%  Weight:      Height:       General exam: awake, alert, no acute distress HEENT: Hearing grossly normal, moist mucous membranes Respiratory system: Lungs clear bilaterally, no wheezing, no coughing spells noted during my encounter this morning. Cardiovascular system: RRR, no peripheral edema Central nervous system: A&O x4. no gross focal neurologic deficits, normal speech but voice is a bit tremulous Extremities: No asymmetric edema and no calf tenderness on palpation bilaterally, negative Homans, no edema, normal tone Psychiatry: normal mood, congruent affect, judgement and insight appear normal   Data Reviewed:  Notable labs: Notable labs: Albumin 3.1, total protein 6.4, CRP improving 1.7.  Troponin 10.  BNP 82.4 .  EKG normal sinus rhythm 78 bpm, LBBB (not new).    Family Communication: daughter [name] at bedside on rounds today 5/21  Disposition: Status is: Inpatient Remains inpatient appropriate because: Severity of illness with profound weakness, symptomatic orthostasis.  Expect will need SNF for rehab, this will require 10 day isolation prior to d/c.    Planned Discharge Destination: Home    Time spent: 35 minutes  Author: Ezekiel Slocumb, DO 08/16/2021 3:30 PM  For on call review [email or web address].

## 2021-08-16 NOTE — Progress Notes (Incomplete)
Patient c/o mid chest pain and below left breast. Reports this pain does not feel cardiac related. Pain is sharp, intermittent and exacerbates with coughing. Reports pain feels more muscular. Tylenol given earlier without much improvement. She requested a stronger pain medication. Dr.Griffith has been notified of the above.

## 2021-08-16 NOTE — Progress Notes (Signed)
Physical Therapy Treatment Patient Details Name: Candace [name] MRN: [id number] DOB: [birth year] Today's Date: 08/16/2021   History of Present Illness Patient is a 71 year old female with COVID-19 presenting with complaints of profound generalized weakness, diarrhea, myalgias, sore throat, headache, cough productive of yellow phlegm and chills. Sepsis due to COVID-19 with hypotension and tachycardia.    PT Comments    Pt ready to get up for breakfast.  She is able to get to EOB with increased time and min a x 1 with heavy use of rails. Steady in sitting but does endorse dizziness and chest pain.  Returned to supine and called Therapist, sports.  EKG was done yesterday and c/o are consistent with muscular pain from coughing and decision was made to proceed with OOB if tolerated.  She is assisted back to sitting and stands to RW with mod a x 1 to max a x 1 due to LE's buckling and forward lean on walker.  She is able to remain standing for about 1 minute but othostatic vitals are not able to be completed as she cannot remain standing for full time and moves arm causing misread. Supine 116/68 P 71 Sitting 96/77 P 79 Sitting after 1 minute stand 99/51 P 75  She agrees to get to chair and is transferred with stand pivot to recliner with mod a x 1 and gait belt. Remains in chair with family in room and needs met.  Communicated with RN's and CNA assigned to patient.  SNF remains appropriate for transition.   Recommendations for follow up therapy are one component of a multi-disciplinary discharge planning process, led by the attending physician.  Recommendations may be updated based on patient status, additional functional criteria and insurance authorization.  Follow Up Recommendations  Skilled nursing-short term rehab (<3 hours/day)     Assistance Recommended at Discharge Intermittent Supervision/Assistance  Patient can return home with the following A lot of help with walking and/or transfers;A lot of help  with bathing/dressing/bathroom;Assist for transportation;Help with stairs or ramp for entrance;Assistance with cooking/housework   Equipment Recommendations       Recommendations for Other Services       Precautions / Restrictions Precautions Precautions: Fall Restrictions Weight Bearing Restrictions: No     Mobility  Bed Mobility Overal bed mobility: Needs Assistance Bed Mobility: Supine to Sit, Sit to Supine     Supine to sit: Min assist Sit to supine: Mod assist   General bed mobility comments: increased time and heavy use of rails    Transfers Overall transfer level: Needs assistance Equipment used: Rolling walker (2 wheels), None Transfers: Sit to/from Stand Sit to Stand: Mod assist Stand pivot transfers: Mod assist              Ambulation/Gait               General Gait Details: unsafe/unable to attempt due to dizziness with sitting/standing activity.frequent falls at home   Stairs             Wheelchair Mobility    Modified Rankin (Stroke Patients Only)       Balance Overall balance assessment: Needs assistance, History of Falls Sitting-balance support: Feet supported Sitting balance-[name] Scale: Fair     Standing balance support: Bilateral upper extremity supported Standing balance-[name] Scale: Poor                              Cognition Arousal/Alertness: Awake/alert Behavior During  Therapy: WFL for tasks assessed/performed Overall Cognitive Status: Within Functional Limits for tasks assessed                                          Exercises      General Comments        Pertinent Vitals/Pain Pain Assessment Pain Assessment: 0-10 Pain Score: 7  Pain Location: chest - seems muscular from coughing vs cardiac Pain Descriptors / Indicators: Sore Pain Intervention(s): Limited activity within patient's tolerance, Monitored during session, Repositioned    Home Living                           Prior Function            PT Goals (current goals can now be found in the care plan section) Progress towards PT goals: Progressing toward goals    Frequency    Min 2X/week      PT Plan Current plan remains appropriate    Co-evaluation              AM-PAC PT "6 Clicks" Mobility   Outcome Measure  Help needed turning from your back to your side while in a flat bed without using bedrails?: A Little Help needed moving from lying on your back to sitting on the side of a flat bed without using bedrails?: A Lot Help needed moving to and from a bed to a chair (including a wheelchair)?: A Lot Help needed standing up from a chair using your arms (e.g., wheelchair or bedside chair)?: A Lot Help needed to walk in hospital room?: Total Help needed climbing 3-5 steps with a railing? : Total 6 Click Score: 11    End of Session Equipment Utilized During Treatment: Gait belt Activity Tolerance: Patient limited by fatigue;Treatment limited secondary to medical complications (Comment) Patient left: in chair;with call bell/phone within reach;with family/visitor present;with chair alarm set Nurse Communication: Mobility status;Precautions PT Visit Diagnosis: Unsteadiness on feet (R26.81);Muscle weakness (generalized) (M62.81);History of falling (Z91.81)     Time: 8921-1941 PT Time Calculation (min) (ACUTE ONLY): 31 min  Charges:  $Therapeutic Activity: 23-37 mins                    Chesley Noon, PTA 08/16/21, 9:24 AM

## 2021-08-16 NOTE — Assessment & Plan Note (Signed)
PT/OT recommending SNF at this time.  TOC following for placement.  Patient will require 10-day isolation.  Prior to SNF discharge. -- Fall precautions --Out of bed to chair at least twice daily -- Continue PT/OT

## 2021-08-17 DIAGNOSIS — I951 Orthostatic hypotension: Secondary | ICD-10-CM | POA: Diagnosis not present

## 2021-08-17 DIAGNOSIS — R531 Weakness: Secondary | ICD-10-CM

## 2021-08-17 DIAGNOSIS — I480 Paroxysmal atrial fibrillation: Secondary | ICD-10-CM | POA: Diagnosis not present

## 2021-08-17 DIAGNOSIS — U071 COVID-19: Secondary | ICD-10-CM | POA: Diagnosis not present

## 2021-08-17 LAB — GLUCOSE, CAPILLARY
Glucose-Capillary: 165 mg/dL — ABNORMAL HIGH (ref 70–99)
Glucose-Capillary: 111 mg/dL — ABNORMAL HIGH (ref 70–99)
Glucose-Capillary: 151 mg/dL — ABNORMAL HIGH (ref 70–99)
Glucose-Capillary: 172 mg/dL — ABNORMAL HIGH (ref 70–99)

## 2021-08-17 MED ORDER — OXYCODONE HCL 5 MG PO TABS
5.0000 mg | ORAL_TABLET | ORAL | Status: DC | PRN
Start: 2021-08-17 — End: 2021-08-18
  Administered 2021-08-17 – 2021-08-18 (×2): 5 mg via ORAL
  Filled 2021-08-17 (×2): qty 1

## 2021-08-17 NOTE — TOC Progression Note (Signed)
Transition of Care [facility]) - Progression Note    Patient Details  Name: Candace [name] MRN: [id number] Date of Birth: March 01, 1951  [facility] CM/SW Contact  Eileen Stanford, LCSW Phone Number: 08/17/2021, 3:13 PM  Clinical Narrative: Pt's wishes is to return home at d/c- therapy aware. Pt to continue to work with therapy. Pt would not be able to dc to SNF until 5/27 due to COVID protocol. CSW will provide bed offers as we receive them.      Expected Discharge Plan: [place] Barriers to Discharge: Continued Medical Work up  Expected Discharge Plan and Services Expected Discharge Plan: [place] Choice: [place] arrangements for the past 2 months: Single Family Home                                       Social Determinants of Health (SDOH) Interventions    Readmission Risk Interventions     View : No data to display.

## 2021-08-17 NOTE — Progress Notes (Signed)
Progress Note   Patient: Candace [name] ZSM:[id number] DOB: [birth year] DOA: 08/11/2021     6 DOS: the patient was seen and examined on 08/17/2021   Brief hospital course: HPI on admission:   Candace [name] is a 71 y.o. female with medical history significant for coronary artery disease status post CABG, history of unstable angina, anxiety disorder, chronic diastolic dysfunction CHF, paroxysmal A-fib, hypertension, depression and anxiety as well as diabetes mellitus who presented to the ER on 08/11/2021 with complaints of 3-day history progressive generalized weakness, myalgias, sore throat, headache and productive cough, chills, poor p.o. intake and diarrhea.  She reported testing positive for COVID-19 on a home test 08/10/2021.  Due to worsening weakness and inability to ambulate at home, EMS was called, patient was found to be hypotensive and transported to the ER.  Admitted for further evaluation management of COVID-19 infection.  No pneumonia or hypoxia.  Being treated with Paxlovid and supportive care.  Assessment and Plan: * [id number] Presented with complaints of profound generalized weakness, diarrhea, myalgias, sore throat, headache, cough productive of yellow phlegm and chills. Tested positive for COVID-19 at home on 5/15, COVID-19 PCR here positive as well.  Patient reports did receive COVID vaccines. Sepsis due to COVID-19-on arrival patient hypotensive and tachycardic. -- Continue Paxlovid -- Steroids deferred given hypoxia -- Supportive care with bronchodilators, antitussives, antipyretics as needed, vitamins, pulmonary hygiene -- Supplemental oxygen if needed to maintain sats above 90%  Atrial fibrillation with RVR ([place]) Afternoon 5/18: Patient heart rate sustaining in 120s.  Takes metoprolol tartrate 100 mg twice daily at home.  This has been held due to softer BPs. -- Continue Lopressor at 25 mg twice daily for now, hold if MAP < 65 -- IV Lopressor pushes PRN if BP will  tolerate  Paroxysmal atrial fibrillation (HCC) Overall rate controlled, tachycardia secondary to COVID infection. -- Metoprolol has been on hold due to hypotension and orthostatic symptoms.  Started midodrine, will resume reduced dose of metoprolol 12.5 mg twice daily. --Continue Eliquis   Chronic diastolic CHF (congestive heart failure) ([place]) Patient is overall euvolemic and well compensated.  Last echo from April 2023 showed EF 55 to 71%, grade 1 diastolic dysfunction. Due to hypotension on admission, Lasix, Entresto, metoprolol and nitrates were held.   Hypokalemia K3.3 this morning.   Replace with oral K-Cl 40 mEq x2.  Monitor BMP and replace K as needed.  Monitor Mg level as well.  Orthostatic hypotension Initially, baseline BPs were low most likely secondary to hypovolemia in the setting of nausea, vomiting, diarrhea and poor p.o. intake.  On arrival, systolic BP was low 21F, responded well to IV fluid bolus.  5/19-20: Patient resting BPs have improved but somewhat soft.  Does have symptomatic orthostatic hypotension, unable to tolerate standing due to dizziness  --Start midodrine gram twice daily with meals for now --Daily orthostatic vitals -- Maintain MAP above 65 with fluids if needed -- Monitor volume status closely if requiring fluids   Essential hypertension Antihypertensives are on hold due to soft BPs.  Resume when indicated  Positive blood culture 1 of 4 bottles (anaerobic) admission blood culture positive for staph species.  Contaminant suspected. Follow cultures to final result. Repeat blood culture.  Depression with anxiety Continue Zoloft and Xanax  Type 2 diabetes mellitus with diabetic polyneuropathy, with long-term current use of insulin (HCC) On reduced dose of basal insulin, 10 units at bedtime.  Sliding scale NovoLog.  Adjust insulin for goal 140-180.  History of vertigo  SheWhile patient does have orthostatic hypotension and associated dizziness,  also has a history of vertigo that has responded well to meclizine in the past.  Some of her dizziness complaints sound more related to vertigo than orthostasis, and she reports similar to when she has taken meclizine with relief in the past. -- Start meclizine 3 times daily as needed for dizziness/vertigo and related to orthostatic hypotension  Obesity (BMI 30-39.9) Body mass index is 30.18 kg/m. Complicates overall care and prognosis.  Recommend lifestyle modifications including physical activity and diet for weight loss and overall long-term health.   Generalized weakness PT/OT recommending SNF at this time.  TOC following for placement.  Patient will require 10-day isolation.  Prior to SNF discharge. -- Fall precautions --Out of bed to chair at least twice daily -- Continue PT/OT  Coronary artery disease Overall stable.  Having some atypical chest pain secondary to severe coughing spells.  See atypical chest pain  Atypical chest pain Patient has had anterior central chest discomfort associated with her significant coughing with COVID. Morning of 5/20, patient had sudden onset of more severe and internal chest pain/pressure during encounter on rounds. -- EKG normal sinus rhythm with LBBB (not new) --Troponin x2 normal -- Monitor closely        Subjective: Patient was awake resting in bed when seen on rounds this morning.  Her son is at bedside this morning.  Daughter Candace [name] joined briefly by speaker phone.  Patient reports again having a headache today, ongoing sore throat, coughing spells.  Still feels quite weak and expresses some frustration over her slow recovery.  No other acute complaints at this time.  Physical Exam: Vitals:   08/17/21 0333 08/17/21 0544 08/17/21 0548 08/17/21 0859  BP: (!) 120/58  123/77 116/70  Pulse: 92  92 85  Resp: '16  18 16  '$ Temp: 98.7 F (37.1 C)  98.1 F (36.7 C) 98.1 F (36.7 C)  TempSrc:   Oral   SpO2: 95%  93% 94%  Weight:  81.5 kg     Height:       General exam: Awake, alert, no acute distress HEENT: Hearing grossly normal, moist mucous membranes Respiratory system: Lungs are clear without wheezes rales or rhonchi.  Normal respiratory effort and room air occasional dry sounding cough. Cardiovascular system: Regular rhythm, no peripheral edema Central nervous system: A&O x4.  No gross focal deficits, stable baseline tremor Extremities: Moves all, normal tone, no edema Psychiatry: Normal mood and affect, judgment and insight appear normal   Data Reviewed: Notable labs: No new labs to review today.  CBGs are at goal    Family Communication: Son at bedside on rounds today 5/22.  Daughter Candace [name] has been present at bedside on rounds the past few days.  Candace [name] was on speaker phone during today's encounter as well.  Disposition: Status is: Inpatient Remains inpatient appropriate because: Severity of illness with profound weakness, symptomatic orthostasis.  Expect will need SNF for rehab, this will require 10 day isolation prior to d/c.    Planned Discharge Destination: Home    Time spent: 35 minutes  Author: Ezekiel Slocumb, DO 08/17/2021 2:01 PM  For on call review [email or web address].

## 2021-08-17 NOTE — Progress Notes (Signed)
Physical Therapy Treatment Patient Details Name: Candace [name] MRN: [id number] DOB: 01-01-1951 Today's Date: 08/17/2021   History of Present Illness Patient is a 71 year old female with COVID-19 presenting with complaints of profound generalized weakness, diarrhea, myalgias, sore throat, headache, cough productive of yellow phlegm and chills. Sepsis due to COVID-19 with hypotension and tachycardia.    PT Comments    Pt in bed.  Stated she was up with nursing earlier but needed to return to bed due to nausea but is feeling better now and ready to try.  Confirmed after session that she was able to walk around bed with nursing earlier.  She requires less assist today to get to EOB and initiates tasks more.  Steady in sitting and denies dizziness in transition or earlier.  She is able to stand at bedside with min a x 1 and knees do not buckle like yesterday.  Static standing with RW with and without UE support.  Sits after 1 minute due to fatigue.   Begins dry heaving. On second stand she is able to stand with min a x 1 and sidestep along bed then transition to chair.  She opts to remain up for lunch.  Emesis basin and needs in reach.  Will try to alternate PT and OT sessions during the week as schedule allows for increased daily mobility.   Recommendations for follow up therapy are one component of a multi-disciplinary discharge planning process, led by the attending physician.  Recommendations may be updated based on patient status, additional functional criteria and insurance authorization.  Follow Up Recommendations  Skilled nursing-short term rehab (<3 hours/day)     Assistance Recommended at Discharge Intermittent Supervision/Assistance  Patient can return home with the following Assist for transportation;Help with stairs or ramp for entrance;Assistance with cooking/housework;A little help with walking and/or transfers;A little help with bathing/dressing/bathroom   Equipment  Recommendations  Rolling walker (2 wheels);BSC/3in1    Recommendations for Other Services       Precautions / Restrictions Precautions Precautions: Fall Restrictions Weight Bearing Restrictions: No     Mobility  Bed Mobility Overal bed mobility: Needs Assistance Bed Mobility: Supine to Sit     Supine to sit: Min assist          Transfers Overall transfer level: Needs assistance Equipment used: Rolling walker (2 wheels) Transfers: Sit to/from Stand Sit to Stand: Min assist                Ambulation/Gait Ambulation/Gait assistance: Min guard, Min assist Gait Distance (Feet): 3 Feet Assistive device: Rolling walker (2 wheels) Gait Pattern/deviations: Step-to pattern Gait velocity: dec     General Gait Details: no dizziness or buckling today, limited by nausea   Stairs             Wheelchair Mobility    Modified Rankin (Stroke Patients Only)       Balance Overall balance assessment: Needs assistance, History of Falls Sitting-balance support: Feet supported Sitting balance-[name] Scale: Fair     Standing balance support: Bilateral upper extremity supported Standing balance-[name] Scale: Fair Standing balance comment: much improved today.                            Cognition Arousal/Alertness: Awake/alert Behavior During Therapy: WFL for tasks assessed/performed Overall Cognitive Status: Within Functional Limits for tasks assessed  Exercises      General Comments        Pertinent Vitals/Pain Pain Assessment Pain Assessment: No/denies pain Pain Location: denies pain today    Home Living                          Prior Function            PT Goals (current goals can now be found in the care plan section) Progress towards PT goals: Progressing toward goals    Frequency    Min 2X/week      PT Plan Current plan remains appropriate     Co-evaluation              AM-PAC PT "6 Clicks" Mobility   Outcome Measure  Help needed turning from your back to your side while in a flat bed without using bedrails?: None Help needed moving from lying on your back to sitting on the side of a flat bed without using bedrails?: A Little Help needed moving to and from a bed to a chair (including a wheelchair)?: A Little Help needed standing up from a chair using your arms (e.g., wheelchair or bedside chair)?: A Little Help needed to walk in hospital room?: A Little Help needed climbing 3-5 steps with a railing? : A Lot 6 Click Score: 18    End of Session Equipment Utilized During Treatment: Gait belt Activity Tolerance: Patient tolerated treatment well;Other (comment) Patient left: in chair;with call bell/phone within reach;with chair alarm set Nurse Communication: Mobility status;Precautions PT Visit Diagnosis: Unsteadiness on feet (R26.81);Muscle weakness (generalized) (M62.81);History of falling (Z91.81)     Time: 0370-4888 PT Time Calculation (min) (ACUTE ONLY): 13 min  Charges:  $Therapeutic Activity: 8-22 mins                   Chesley Noon, PTA 08/17/21, 12:50 PM

## 2021-08-18 DIAGNOSIS — U071 COVID-19: Secondary | ICD-10-CM | POA: Diagnosis not present

## 2021-08-18 LAB — COMPREHENSIVE METABOLIC PANEL
ALT: 39 U/L (ref 0–44)
AST: 38 U/L (ref 15–41)
Albumin: 3.2 g/dL — ABNORMAL LOW (ref 3.5–5.0)
Alkaline Phosphatase: 99 U/L (ref 38–126)
Anion gap: 8 (ref 5–15)
BUN: 17 mg/dL (ref 8–23)
CO2: 27 mmol/L (ref 22–32)
Calcium: 9.2 mg/dL (ref 8.9–10.3)
Chloride: 104 mmol/L (ref 98–111)
Creatinine, Ser: 0.59 mg/dL (ref 0.44–1.00)
GFR, Estimated: >60 mL/min (ref >60)
Glucose, Bld: 160 mg/dL — ABNORMAL HIGH (ref 70–99)
Potassium: 3.9 mmol/L (ref 3.5–5.1)
Sodium: 139 mmol/L (ref 135–145)
Total Bilirubin: 0.4 mg/dL (ref 0.3–1.2)
Total Protein: 6.3 g/dL — ABNORMAL LOW (ref 6.5–8.1)

## 2021-08-18 LAB — GLUCOSE, CAPILLARY
Glucose-Capillary: 150 mg/dL — ABNORMAL HIGH (ref 70–99)
Glucose-Capillary: 160 mg/dL — ABNORMAL HIGH (ref 70–99)
Glucose-Capillary: 151 mg/dL — ABNORMAL HIGH (ref 70–99)
Glucose-Capillary: 194 mg/dL — ABNORMAL HIGH (ref 70–99)

## 2021-08-18 MED ORDER — OXYCODONE HCL 5 MG PO TABS
2.5000 mg | ORAL_TABLET | ORAL | Status: DC | PRN
  Administered 2021-08-19 – 2021-08-21 (×7): 2.5 mg via ORAL
  Filled 2021-08-18 (×7): qty 1

## 2021-08-18 MED ORDER — METHOCARBAMOL 500 MG PO TABS
500.0000 mg | ORAL_TABLET | Freq: Three times a day (TID) | ORAL | Status: DC | PRN
  Administered 2021-08-21: 500 mg via ORAL
  Filled 2021-08-18 (×2): qty 1

## 2021-08-18 NOTE — Progress Notes (Signed)
Progress Note   Patient: Candace [name] EAV:[id number] DOB: Nov 07, 1950 DOA: 08/11/2021     7 DOS: the patient was seen and examined on 08/18/2021   Brief hospital course: HPI on admission:   NATSUKO KELSAY is a 71 y.o. female with medical history significant for coronary artery disease status post CABG, history of unstable angina, anxiety disorder, chronic diastolic dysfunction CHF, paroxysmal A-fib, hypertension, depression and anxiety as well as diabetes mellitus who presented to the ER on 08/11/2021 with complaints of 3-day history progressive generalized weakness, myalgias, sore throat, headache and productive cough, chills, poor p.o. intake and diarrhea.  She reported testing positive for COVID-19 on a home test 08/10/2021.  Due to worsening weakness and inability to ambulate at home, EMS was called, patient was found to be hypotensive and transported to the ER.  Admitted for further evaluation management of COVID-19 infection.  No pneumonia or hypoxia.  Being treated with Paxlovid and supportive care.  Assessment and Plan: * [id number] Presented with complaints of profound generalized weakness, diarrhea, myalgias, sore throat, headache, cough productive of yellow phlegm and chills. Tested positive for COVID-19 at home on 5/15, COVID-19 PCR here positive as well.  Patient reports did receive COVID vaccines. Sepsis due to COVID-19-on arrival patient hypotensive and tachycardic. -- Completed Paxlovid -- Steroids deferred given no hypoxia -- Supportive care with bronchodilators, antitussives, antipyretics as needed, vitamins, pulmonary hygiene -- Supplemental oxygen if needed to maintain sats above 90%  Atrial fibrillation with RVR ([place]) Afternoon 5/18: Patient heart rate sustaining in 120s.  Takes metoprolol tartrate 100 mg twice daily at home.  This has been held due to softer BPs. -- Continue Lopressor at 25 mg twice daily for now, hold if MAP < 65 -- IV Lopressor pushes PRN if BP will  tolerate  Paroxysmal atrial fibrillation (HCC) Overall rate controlled, tachycardia secondary to COVID infection. -- Metoprolol has been on hold due to hypotension and orthostatic symptoms.   --Started on midodrine and metoprolol has been resumed --Continue Eliquis  -- Advise close cardiology follow-up after discharge  Chronic diastolic CHF (congestive heart failure) ([place]) Patient is overall euvolemic and well compensated.  Last echo from April 2023 showed EF 55 to 13%, grade 1 diastolic dysfunction. --Due to hypotension on admission, Lasix, Entresto, metoprolol and nitrates were held. --Metoprolol resumed after addition of midodrine -- Monitor volume status closely, daily weights  Hypokalemia Resolved with replacement Monitor BMP and replace K as needed.  Monitor Mg level as well.  Orthostatic hypotension Initially, baseline BPs were low most likely secondary to hypovolemia in the setting of nausea, vomiting, diarrhea and poor p.o. intake.  On arrival, systolic BP was low 08M, responded well to IV fluid bolus.  5/19-20: Patient resting BPs have improved but somewhat soft.  Does have symptomatic orthostatic hypotension, unable to tolerate standing due to dizziness  --Started midodrine 5 mg twice daily with meals for now --Daily orthostatic vitals -- Maintain MAP above 65 with fluids if needed -- Monitor volume status closely if requiring fluids   Essential hypertension Antihypertensives are on hold due to soft BPs.  Resume when indicated. -- Metoprolol has been resumed -- Patient has been started on midodrine for orthostatic hypotension and soft resting BP  Positive blood culture 1 of 4 bottles (anaerobic) admission blood culture positive for staph species.  Contaminant suspected. Follow cultures to final result. Repeat blood culture negative to date.  Depression with anxiety Continue Zoloft and Xanax PRN  Type 2 diabetes mellitus with diabetic polyneuropathy,  with long-term  current use of insulin (HCC) On reduced dose of basal insulin, 10 units at bedtime.  Sliding scale NovoLog.  Adjust insulin for goal 140-180.  CBGs been at goal  History of vertigo While patient does have orthostatic hypotension and associated dizziness, also has a history of vertigo that has responded well to meclizine in the past.  Some of her dizziness complaints sound more related to vertigo than orthostasis, and she reports similar to when she has taken meclizine with relief in the past. -- Start meclizine 3 times daily as needed for dizziness/vertigo and related to orthostatic hypotension  Obesity (BMI 30-39.9) Body mass index is 30.18 kg/m. Complicates overall care and prognosis.  Recommend lifestyle modifications including physical activity and diet for weight loss and overall long-term health.   Generalized weakness PT/OT recommending SNF at this time.  TOC following for placement.  Patient will require 10-day isolation prior to SNF discharge.  Earliest DC to SNF 5/27. -- Fall precautions --Out of bed to chair at least twice daily -- Continue PT/OT -- Ambulate as tolerated with assistance  Coronary artery disease Overall stable.  Having some atypical chest pain secondary to severe coughing spells.  See atypical chest pain -- Continue Plavix, Zetia, metoprolol  Atypical chest pain Patient has had anterior central chest discomfort associated with her significant coughing with COVID. Morning of 5/20, patient had sudden onset of more severe and internal chest pain/pressure during encounter on rounds. -- EKG normal sinus rhythm with LBBB (not new) --Troponin x2 normal -- Monitor closely        Subjective: Patient was seen up in recliner visiting with daughter and son at bedside today.  She reports continuing to feel very weak and tired.  Son reports that she got dizzy when she was up on her feet again after just ambulating very short distance.  She did better with PT  yesterday and is a little frustrated not to do as well today.  She reports ongoing hacking cough spells but slowly better.  No complaints of headache or sore throat today.  She does continue to have some left-sided shoulder and anterior chest pain that she feels may be related to arthritis, also noted to have very hypertonic neck and shoulder on that side.  Daughter and son continue want patient to go to rehab as recommended.  She still hopes to get strong enough and return home with home health  Physical Exam: Vitals:   08/17/21 0548 08/17/21 0859 08/17/21 1632 08/17/21 2144  BP: 123/77 116/70 118/69 119/80  Pulse: 92 85 82 91  Resp: '18 16 18 18  '$ Temp: 98.1 F (36.7 C) 98.1 F (36.7 C) 97.8 F (36.6 C) 97.8 F (36.6 C)  TempSrc: Oral  Oral   SpO2: 93% 94% 95% 94%  Weight:      Height:       General exam: Awake, sitting up in recliner, appears fatigued HEENT: Hearing grossly normal, moist membranes Respiratory system: CTAB, no wheezes rales or rhonchi, normal respiratory effort, on room air. Cardiovascular system: RRR, no JVD seen, no peripheral edema Central nervous system: A&O x4.  No gross focal deficits, stable baseline tremor Extremities: Moves all, normal tone, no edema Psychiatry: Normal mood and affect, judgment and insight appear normal   Data Reviewed: Notable labs: glucose 160 albumin 3.2, total protein 6.3.  CBGs at goal    Family Communication: Daughter and son at bedside on rounds today 5/23  Disposition: Status is: Inpatient  Remains inpatient appropriate  because: Profound weakness, symptomatic orthostasis.   Expect will need SNF for rehab, requires 10 day isolation prior to d/c (earliest d/c to [facility] 5/27).    Planned Discharge Destination: Home    Time spent: 35 minutes  Author: Ezekiel Slocumb, DO 08/18/2021 1:33 PM  For on call review [email or web address].

## 2021-08-18 NOTE — Progress Notes (Addendum)
Physical Therapy Treatment Patient Details Name: Candace [name] MRN: [id number] DOB: Oct 08, 1950 Today's Date: 08/18/2021   History of Present Illness Patient is a 71 year old female with COVID-19 presenting with complaints of profound generalized weakness, diarrhea, myalgias, sore throat, headache, cough productive of yellow phlegm and chills. Sepsis due to COVID-19 with hypotension and tachycardia.    PT Comments    Pt in chair, feeling ok today.  Stood with min a x 1 and cues for hand placement.  She stated she feels good and wants to walk to door and back.  She is able to initiate gait with RW and walk about 10' with very slow cautious gait with very short step height and length.  Gait speed continues to decrease and pt endorses feeling ok but seems to become distant and slow responses.  Son in room and brings chair for her to sit.  She stated she is spinning in chair and it does relieve with time and feet elevated.  She is pulled back to recliner and opts to remain up in recliner and transfers with min a x 1 and poor hand placements.   Discussed with RN and OT.   She does have hx of vertigo that may have caused symptoms also.   Long discussion with son in room regarding discharge plan.  Reviewed with pt her request to return home vs SNF as in [facility] note from yesterday.  Stated she does not recall conversation.  Discussed with son with pt's permission her need for 24 hour care at discharge if she chooses to return home vs SNF.  Stated she has access to DME except wheelchair and has no stairs into home.    At this point SNF is still recommended and it seems pt will still need increased help after 5/27 which is earliest she could transition due to Covid + given her symptoms and slower recovery.  If she does choose to return home, she will need 24 hour care and a wheelchair as she is not walking household distances safely along with HHPT and aide services as available.   Patient suffers from  weakness from Covid 19 which impairs his/her ability to perform daily activities like toileting, feeding, dressing, grooming, bathing in the home. A cane, walker, crutch will not resolve the patient's issue with performing activities of daily living. A lightweight wheelchair and cushion is required/recommended and will allow patient to safely perform daily activities.   Patient can safely propel the wheelchair in the home or has a caregiver who can provide assistance.    Recommendations for follow up therapy are one component of a multi-disciplinary discharge planning process, led by the attending physician.  Recommendations may be updated based on patient status, additional functional criteria and insurance authorization.  Follow Up Recommendations  Skilled nursing-short term rehab (<3 hours/day)     Assistance Recommended at Discharge    Patient can return home with the following Assist for transportation;Help with stairs or ramp for entrance;Assistance with cooking/housework;A little help with walking and/or transfers;A little help with bathing/dressing/bathroom   Equipment Recommendations  Wheelchair (measurements PT);Wheelchair cushion (measurements PT)    Recommendations for Other Services       Precautions / Restrictions Precautions Precautions: Fall Restrictions Weight Bearing Restrictions: No     Mobility  Bed Mobility               General bed mobility comments: in recliner before and after    Transfers Overall transfer level: Needs assistance Equipment  used: Rolling walker (2 wheels) Transfers: Sit to/from Stand Sit to Stand: Min assist, Min guard                Ambulation/Gait Ambulation/Gait assistance: Min guard, Min assist Gait Distance (Feet): 10 Feet Assistive device: Rolling walker (2 wheels) Gait Pattern/deviations: Step-through pattern, Decreased step length - right, Decreased step length - left, Narrow base of support Gait velocity: dec      General Gait Details: very slow short shuffling steps.  from recliner to end of bed before c/o dizziness and room/chair spinning once sitting.  did not pass out and was sat safely in chair.  suspect orthostatic vitals but no monitor in room to check.   Stairs             Wheelchair Mobility    Modified Rankin (Stroke Patients Only)       Balance Overall balance assessment: Needs assistance, History of Falls Sitting-balance support: Feet supported Sitting balance-[name] Scale: Fair     Standing balance support: Bilateral upper extremity supported Standing balance-[name] Scale: Fair Standing balance comment: much improved today.                            Cognition Arousal/Alertness: Awake/alert Behavior During Therapy: WFL for tasks assessed/performed Overall Cognitive Status: Within Functional Limits for tasks assessed                                          Exercises      General Comments        Pertinent Vitals/Pain Pain Assessment Pain Assessment: No/denies pain Pain Intervention(s): Monitored during session    Home Living                          Prior Function            PT Goals (current goals can now be found in the care plan section) Progress towards PT goals: Progressing toward goals    Frequency    Min 2X/week      PT Plan Current plan remains appropriate    Co-evaluation              AM-PAC PT "6 Clicks" Mobility   Outcome Measure  Help needed turning from your back to your side while in a flat bed without using bedrails?: None Help needed moving from lying on your back to sitting on the side of a flat bed without using bedrails?: A Little Help needed moving to and from a bed to a chair (including a wheelchair)?: A Little Help needed standing up from a chair using your arms (e.g., wheelchair or bedside chair)?: A Little Help needed to walk in hospital room?: A Lot Help needed  climbing 3-5 steps with a railing? : A Lot 6 Click Score: 17    End of Session Equipment Utilized During Treatment: Gait belt Activity Tolerance: Patient tolerated treatment well;Treatment limited secondary to medical complications (Comment) Patient left: in chair;with call bell/phone within reach;with chair alarm set;with family/visitor present Nurse Communication: Mobility status;Precautions PT Visit Diagnosis: Unsteadiness on feet (R26.81);Muscle weakness (generalized) (M62.81);History of falling (Z91.81)     Time: 1308-6578 PT Time Calculation (min) (ACUTE ONLY): 18 min  Charges:  $Gait Training: 8-22 mins  Chesley Noon, PTA 08/18/21, 10:26 AM

## 2021-08-18 NOTE — Progress Notes (Signed)
Occupational Therapy Treatment Patient Details Name: Candace [name] MRN: [id number] DOB: October 19, 1950 Today's Date: 08/18/2021   History of present illness Patient is a 71 year old female with COVID-19 presenting with complaints of profound generalized weakness, diarrhea, myalgias, sore throat, headache, cough productive of yellow phlegm and chills. Sepsis due to COVID-19 with hypotension and tachycardia.   OT comments  Upon entering the room, pt supine in bed with no c/o pain and agreeable to OT intervention. Pt performs supine >sit with min A for trunk support and slow deliberate movements. Pt sitting on EOB ~ 4 minutes with supervision and then standing with min A for EOB. Pt taking several steps to sink and stands for 7 minutes to wash face and brush teeth but then requesting to return to bed secondary to fatigue and increasing dizziness. Pt declined to engage in further therapeutic intervention and needing min A to return to bed. Pt continues to fatigue very quickly. Pt continues to report wishes to return home at discharge but continued recommendation to short term rehab to address functional deficits before returning home.    Recommendations for follow up therapy are one component of a multi-disciplinary discharge planning process, led by the attending physician.  Recommendations may be updated based on patient status, additional functional criteria and insurance authorization.    Follow Up Recommendations  Skilled nursing-short term rehab (<3 hours/day)    Assistance Recommended at Discharge Frequent or constant Supervision/Assistance  Patient can return home with the following  A lot of help with walking and/or transfers;A lot of help with bathing/dressing/bathroom;Help with stairs or ramp for entrance;Assistance with cooking/housework;Assist for transportation   Equipment Recommendations  BSC/3in1       Precautions / Restrictions Precautions Precautions: Fall Restrictions Weight  Bearing Restrictions: No       Mobility Bed Mobility Overal bed mobility: Needs Assistance Bed Mobility: Supine to Sit, Sit to Supine     Supine to sit: Min assist Sit to supine: Min assist   General bed mobility comments: min A    Transfers Overall transfer level: Needs assistance Equipment used: 1 person hand held assist Transfers: Sit to/from Stand Sit to Stand: Min assist                 Balance Overall balance assessment: Needs assistance, History of Falls Sitting-balance support: Feet supported Sitting balance-[name] Scale: Fair     Standing balance support: Bilateral upper extremity supported Standing balance-[name] Scale: Fair                             ADL either performed or assessed with clinical judgement   ADL Overall ADL's : Needs assistance/impaired     Grooming: Wash/dry hands;Wash/dry face;Min guard;Oral care;Standing                                      Extremity/Trunk Assessment Upper Extremity Assessment Upper Extremity Assessment: Generalized weakness   Lower Extremity Assessment Lower Extremity Assessment: Generalized weakness        Vision Patient Visual Report: No change from baseline            Cognition Arousal/Alertness: Awake/alert Behavior During Therapy: WFL for tasks assessed/performed Overall Cognitive Status: Within Functional Limits for tasks assessed  Pertinent Vitals/ Pain       Pain Assessment Pain Assessment: No/denies pain         Frequency  Min 2X/week        Progress Toward Goals  OT Goals(current goals can now be found in the care plan section)  Progress towards OT goals: Progressing toward goals  Acute Rehab OT Goals Patient Stated Goal: to get better and go home OT Goal Formulation: With patient Time For Goal Achievement: 08/27/21 Potential to Achieve Goals: [place] Discharge plan  remains appropriate;Frequency remains appropriate       AM-PAC OT "6 Clicks" Daily Activity     Outcome Measure   Help from another person eating meals?: None Help from another person taking care of personal grooming?: A Little Help from another person toileting, which includes using toliet, bedpan, or urinal?: A Lot Help from another person bathing (including washing, rinsing, drying)?: A Lot Help from another person to put on and taking off regular upper body clothing?: A Little Help from another person to put on and taking off regular lower body clothing?: A Lot 6 Click Score: 16    End of Session    OT Visit Diagnosis: Unsteadiness on feet (R26.81);Muscle weakness (generalized) (M62.81)   Activity Tolerance Other (comment);Patient limited by fatigue (dizziness)   Patient Left in bed;with call bell/phone within reach;with bed alarm set   Nurse Communication Mobility status        Time: 8309-4076 OT Time Calculation (min): 19 min  Charges: OT General Charges $OT Visit: 1 Visit OT Treatments $Self Care/Home Management : 8-22 mins  Darleen Crocker, MS, OTR/L , CBIS ascom [phone]  08/18/21, 3:02 PM

## 2021-08-19 ENCOUNTER — Ambulatory Visit: Payer: 59 | Admitting: Cardiovascular Disease

## 2021-08-19 ENCOUNTER — Inpatient Hospital Stay: Payer: 59

## 2021-08-19 ENCOUNTER — Encounter: Payer: Self-pay | Admitting: Internal Medicine

## 2021-08-19 DIAGNOSIS — I4891 Unspecified atrial fibrillation: Secondary | ICD-10-CM | POA: Diagnosis not present

## 2021-08-19 DIAGNOSIS — U071 COVID-19: Secondary | ICD-10-CM | POA: Diagnosis not present

## 2021-08-19 DIAGNOSIS — R0789 Other chest pain: Secondary | ICD-10-CM

## 2021-08-19 LAB — GLUCOSE, CAPILLARY
Glucose-Capillary: 162 mg/dL — ABNORMAL HIGH (ref 70–99)
Glucose-Capillary: 151 mg/dL — ABNORMAL HIGH (ref 70–99)
Glucose-Capillary: 175 mg/dL — ABNORMAL HIGH (ref 70–99)
Glucose-Capillary: 200 mg/dL — ABNORMAL HIGH (ref 70–99)

## 2021-08-19 MED ORDER — GLUCERNA SHAKE PO LIQD
237.0000 mL | Freq: Three times a day (TID) | ORAL | Status: DC
  Administered 2021-08-19 – 2021-08-22 (×7): 237 mL via ORAL

## 2021-08-19 MED ORDER — POLYETHYLENE GLYCOL 3350 17 G PO PACK
17.0000 g | PACK | Freq: Every day | ORAL | Status: DC
  Administered 2021-08-19 – 2021-08-22 (×4): 17 g via ORAL
  Filled 2021-08-19 (×4): qty 1

## 2021-08-19 NOTE — Progress Notes (Signed)
Physical Therapy Treatment Patient Details Name: Candace [name] MRN: [id number] DOB: 03-Apr-1950 Today's Date: 08/19/2021   History of Present Illness Patient is a 71 year old female with COVID-19 presenting with complaints of profound generalized weakness, diarrhea, myalgias, sore throat, headache, cough productive of yellow phlegm and chills. Sepsis due to COVID-19 with hypotension and tachycardia.    PT Comments    Pt was long sitting in bed upon arriving. She is alert and oriented x 3. Did not know what day of the week it was. " I'm still planning to go to rehab before going home." Pt required increased assistance to exit bed when bed placed in flat position without bed rails. Pt is able to stand from elevated surfaces with CGA but requires min assist from lower surfaces heights. She is extremely slow moving and needs increased time to ambulate short distances. Vitals were stable throughout on rm air. Pt is severely deconditioned and required several standing rest due to fatigue. Only ambulated 15 ft prior to needing seated rest. She presents with productive cough. She did urinate on BSC during session and overall tolerated well. Pt will benefit from SNF to address deficits while maximizing independence with ADLs. She will benefit form SNF to address strength, balance, and activity tolerance concerns.    Recommendations for follow up therapy are one component of a multi-disciplinary discharge planning process, led by the attending physician.  Recommendations may be updated based on patient status, additional functional criteria and insurance authorization.  Follow Up Recommendations  Skilled nursing-short term rehab (<3 hours/day)     Assistance Recommended at Discharge Intermittent Supervision/Assistance  Patient can return home with the following Assist for transportation;Help with stairs or ramp for entrance;Assistance with cooking/housework;A little help with walking and/or transfers;A  little help with bathing/dressing/bathroom   Equipment Recommendations  Wheelchair (measurements PT);Wheelchair cushion (measurements PT) (if planning to [place] home. Author recommends SNF at [place].)       Precautions / Restrictions Precautions Precautions: Fall Restrictions Weight Bearing Restrictions: No     Mobility  Bed Mobility Overal bed mobility: Needs Assistance Bed Mobility: Supine to Sit, Sit to Supine  Supine to sit: Min assist, Mod assist (flat bed)  General bed mobility comments: pt require more assistance today + increased time to exit flat bed without bed rails    Transfers Overall transfer level: Needs assistance Equipment used: Rolling walker (2 wheels) Transfers: Sit to/from Stand Sit to Stand: Min assist  General transfer comment: Min assist from lower surface heights with CGA from elevated surfaces    Ambulation/Gait Ambulation/Gait assistance: Min guard Gait Distance (Feet): 15 Feet Assistive device: Rolling walker (2 wheels) Gait Pattern/deviations: Step-through pattern, Decreased step length - right, Decreased step length - left, Narrow base of support Gait velocity: decreased     General Gait Details: pt ambulated ~ 15 ft with slow flexed gait posture. She needs 2 standing rest breaks. vitals were stable on rm air.    Balance Overall balance assessment: Needs assistance, History of Falls Sitting-balance support: Feet supported Sitting balance-[name] Scale: Fair     Standing balance support: Bilateral upper extremity supported Standing balance-[name] Scale: Fair       Cognition Arousal/Alertness: Awake/alert Behavior During Therapy: WFL for tasks assessed/performed Overall Cognitive Status: Within Functional Limits for tasks assessed    General Comments: Pt is A but only oriented x 2. she thought it was friday(wed.) and was somewhat disoriented to situation. Is able to easily conversate and follow commands throughout  General Comments  General comments (skin integrity, edema, etc.): After pt ambulated ~ 15 ft around bed , she requested to use BSC. Author assisted pt to from [facility]. she successfully urinated. Lengthy discussion about importance of increasing activity throughout the day. educated on exercises to promote return in strength      Pertinent Vitals/Pain Pain Assessment Pain Assessment: No/denies pain     PT Goals (current goals can now be found in the care plan section) Acute Rehab PT Goals Patient Stated Goal: rehab then home Progress towards PT goals: Progressing toward goals    Frequency    Min 2X/week      PT Plan Current plan remains appropriate       AM-PAC PT "6 Clicks" Mobility   Outcome Measure  Help needed turning from your back to your side while in a flat bed without using bedrails?: None Help needed moving from lying on your back to sitting on the side of a flat bed without using bedrails?: A Little Help needed moving to and from a bed to a chair (including a wheelchair)?: A Little Help needed standing up from a chair using your arms (e.g., wheelchair or bedside chair)?: A Little Help needed to walk in hospital room?: A Lot Help needed climbing 3-5 steps with a railing? : A Lot 6 Click Score: 17    End of Session   Activity Tolerance: Patient limited by fatigue Patient left: in chair;with call bell/phone within reach;with chair alarm set;with family/visitor present Nurse Communication: Mobility status;Precautions PT Visit Diagnosis: Unsteadiness on feet (R26.81);Muscle weakness (generalized) (M62.81);History of falling (Z91.81)     Time: 5035-4656 PT Time Calculation (min) (ACUTE ONLY): 25 min  Charges:  $Gait Training: 8-22 mins $Therapeutic Activity: 8-22 mins                    Julaine Fusi PTA 08/19/21, 11:30 AM

## 2021-08-19 NOTE — Progress Notes (Signed)
Progress Note    Candace [name]  GEX:[id number] DOB: 1950/12/03  DOA: 08/11/2021 PCP: [name]., MD      Brief Narrative:    Medical records reviewed and are as summarized below:  Candace [name] is a 71 y.o. female with medical history significant for coronary artery disease status post CABG, history of unstable angina, anxiety disorder, chronic diastolic dysfunction CHF, paroxysmal A-fib, hypertension, depression and anxiety as well as diabetes mellitus who presented to the ER on 08/11/2021 with complaints of 3-day history progressive generalized weakness, myalgias, sore throat, headache and productive cough, chills, poor p.o. intake and diarrhea.  She reported testing positive for COVID-19 on a home test 08/10/2021.  Due to worsening weakness and inability to ambulate at home, EMS was called, patient was found to be hypotensive and transported to the ER.  She was admitted to the hospital for COVID-19 infection.  She was treated with Paxlovid.      Assessment/Plan:   Principal Problem:   COVID-19 Active Problems:   Chronic diastolic CHF (congestive heart failure) (HCC)   Paroxysmal atrial fibrillation (HCC)   Atrial fibrillation with RVR (HCC)   Essential hypertension   Orthostatic hypotension   Hypokalemia   Positive blood culture   Type 2 diabetes mellitus with diabetic polyneuropathy, with long-term current use of insulin (HCC)   Depression with anxiety   Atypical chest pain   Coronary artery disease   Generalized weakness   Obesity (BMI 30-39.9)   History of vertigo   Nutrition Problem: Increased nutrient needs Etiology: acute illness  Signs/Symptoms: estimated needs   Body mass index is 32.86 kg/m.  (Obesity)  Sepsis secondary to COVID-19 infection: Completed Paxlovid.  Continue antitussives and bronchodilators as needed.  Atrial fibrillation with RVR: Acute-improved.  Continue metoprolol and Eliquis  Chronic diastolic CHF, CAD:  Continue metoprolol and Plavix  Nonanginal chest pain/pleuritic chest pain: Chest pain is usually worse with coughing.  Repeat EKG today showed known left bundle branch block with T wave changes.  Recent CTA was negative for pulmonary embolism or pneumonia.  Troponins were negative.  Continue to monitor.  Orthostatic hypotension: Continue midodrine  Positive blood culture: 1 out of 4 bottles showed staph species that is likely a contaminant.  Follow-up blood culture was negative.  Generalized weakness: PT and OT recommended further rehabilitation at SNF  Other comorbidities include depression, anxiety, hypertension, type II DM with diabetic polyneuropathy, vertigo     Diet Order             Diet Carb Modified Fluid consistency: Thin; Room service appropriate? Yes  Diet effective now                          Consultants: None  Procedures: None    Medications:    apixaban  5 mg Oral BID   vitamin C  500 mg Oral Daily   chlorpheniramine-HYDROcodone  5 mL Oral Q12H   clopidogrel  75 mg Oral Daily   dextromethorphan-guaiFENesin  1 tablet Oral BID   diclofenac Sodium  2 g Topical QID   ezetimibe  10 mg Oral Daily   feeding supplement  237 mL Oral BID BM   insulin aspart  0-15 Units Subcutaneous TID WC   insulin glargine-yfgn  10 Units Subcutaneous QHS   levalbuterol  1-2 puff Inhalation Q6H WA   metoprolol tartrate  12.5 mg Oral BID   midodrine  5 mg Oral  BID WC   multivitamin with minerals  1 tablet Oral Daily   pantoprazole  20 mg Oral BID   sertraline  100 mg Oral Daily   zinc sulfate  220 mg Oral Daily   Continuous Infusions:   Anti-infectives (From admission, onward)    Start     Dose/Rate Route Frequency Ordered Stop   08/11/21 2200  nirmatrelvir/ritonavir EUA (PAXLOVID) 3 tablet        3 tablet Oral 2 times daily 08/11/21 1708 08/16/21 0936              Family Communication/Anticipated D/C date and plan/Code Status   DVT prophylaxis:   apixaban (ELIQUIS) tablet 5 mg     Code Status: Full Code  Family Communication: None Disposition Plan: Plan to discharge to SNF on 08/22/2021 after completing COVID isolation protocol   Status is: Inpatient Remains inpatient appropriate because: Awaiting placement to SNF       Subjective:   Interval events noted.  She complains of cough with pleuritic, sharp chest pain in the midsternal region.  No shortness of breath, palpitations, dizziness or wheezing  Objective:    Vitals:   08/18/21 1634 08/18/21 2116 08/19/21 0501 08/19/21 0900  BP: 110/61 (!) 137/58 113/73 (!) 142/81  Pulse: 82 84 83   Resp: '18 20 16 18  '$ Temp: 98.2 F (36.8 C) 98.3 F (36.8 C) 98 F (36.7 C) 98.6 F (37 C)  TempSrc:    Oral  SpO2: 98% 93% 93% 95%  Weight:      Height:       No data found.   Intake/Output Summary (Last 24 hours) at 08/19/2021 1047 Last data filed at 08/18/2021 2100 Gross per 24 hour  Intake 240 ml  Output --  Net 240 ml   Filed Weights   08/11/21 1238 08/17/21 0544  Weight: 74.8 kg 81.5 kg    Exam:  GEN: NAD SKIN: Warm and dry EYES: No pallor or icterus ENT: MMM CV: RRR PULM: CTA B ABD: soft, ND, NT, +BS CNS: AAO x 3, non focal EXT: No edema or tenderness        Data Reviewed:   I have personally reviewed following labs and imaging studies:  Labs: Labs show the following:   Basic Metabolic Panel: Recent Labs  Lab 08/13/21 0518 08/14/21 0441 08/15/21 0454 08/16/21 0616 08/18/21 0435  NA 141 140 142 140 139  K 3.3* 3.7 4.3 4.0 3.9  CL 108 106 106 103 104  CO2 '24 23 27 28 27  '$ GLUCOSE 89 180* 86 99 160*  BUN '13 13 12 19 17  '$ CREATININE 0.73 0.63 0.56 0.66 0.59  CALCIUM 8.3* 8.6* 8.9 9.0 9.2  MG 1.9 1.7 2.0 2.0  --    GFR Estimated Creatinine Clearance: 64.8 mL/min (by C-G formula based on SCr of 0.59 mg/dL). Liver Function Tests: Recent Labs  Lab 08/13/21 0518 08/14/21 0441 08/15/21 0454 08/16/21 0616 08/18/21 0435  AST 33 30 29  61* 38  ALT '16 17 17 '$ 39 39  ALKPHOS 58 71 74 85 99  BILITOT 0.5 0.5 0.5 0.6 0.4  PROT 6.3* 6.1* 6.4* 6.6 6.3*  ALBUMIN 3.1* 3.0* 3.1* 3.3* 3.2*   No results for input(s): LIPASE, AMYLASE in the last 168 hours. No results for input(s): AMMONIA in the last 168 hours. Coagulation profile No results for input(s): INR, PROTIME in the last 168 hours.  CBC: Recent Labs  Lab 08/13/21 0518 08/14/21 0441  WBC 4.6 4.7  NEUTROABS  1.8 2.2  HGB 11.7* 12.1  HCT 37.1 38.7  MCV 98.9 99.2  PLT 143* 151   Cardiac Enzymes: No results for input(s): CKTOTAL, CKMB, CKMBINDEX, TROPONINI in the last 168 hours. BNP (last 3 results) Recent Labs    06/10/21 1603  PROBNP 189   CBG: Recent Labs  Lab 08/18/21 0855 08/18/21 1203 08/18/21 1634 08/18/21 2240 08/19/21 0751  GLUCAP 150* 160* 151* 194* 162*   D-Dimer: No results for input(s): DDIMER in the last 72 hours. Hgb A1c: No results for input(s): HGBA1C in the last 72 hours. Lipid Profile: No results for input(s): CHOL, HDL, LDLCALC, TRIG, CHOLHDL, LDLDIRECT in the last 72 hours. Thyroid function studies: No results for input(s): TSH, T4TOTAL, T3FREE, THYROIDAB in the last 72 hours.  Invalid input(s): FREET3 Anemia work up: No results for input(s): VITAMINB12, FOLATE, FERRITIN, TIBC, IRON, RETICCTPCT in the last 72 hours. Sepsis Labs: Recent Labs  Lab 08/13/21 0518 08/14/21 0441  WBC 4.6 4.7    Microbiology Recent Results (from the past 240 hour(s))  Culture, blood (routine x 2)     Status: Abnormal   Collection Time: 08/11/21 12:59 PM   Specimen: BLOOD  Result Value Ref Range Status   Specimen Description   Final    BLOOD RIGHT HAND Performed at [facility], [address], [place], [place] [address]    Special Requests   Final    BOTTLES DRAWN AEROBIC AND ANAEROBIC Blood Culture adequate volume Performed at [facility], [address] [place], [place] [address]    Culture  Setup Time   Final     Organism ID to follow ANAEROBIC BOTTLE ONLY GRAM POSITIVE COCCI CRITICAL RESULT CALLED TO, READ BACK BY AND VERIFIED WITH: NATHAN BELUTH @ 0093 ON 06/13/2021.Marland KitchenMarland KitchenTKR GRAM STAIN REVIEWED-AGREE WITH RESULT    Culture (A)  Final    STAPHYLOCOCCUS HOMINIS THE SIGNIFICANCE OF ISOLATING THIS ORGANISM FROM A SINGLE SET OF BLOOD CULTURES WHEN MULTIPLE SETS ARE DRAWN IS UNCERTAIN. PLEASE NOTIFY THE MICROBIOLOGY DEPARTMENT WITHIN ONE WEEK IF SPECIATION AND SENSITIVITIES ARE REQUIRED. Performed at [facility], [place] [address], [place], [place] [address]    Report Status 08/15/2021 FINAL  Final  Blood Culture ID Panel (Reflexed)     Status: Abnormal   Collection Time: 08/11/21 12:59 PM  Result Value Ref Range Status   Enterococcus faecalis NOT DETECTED NOT DETECTED Final   Enterococcus Faecium NOT DETECTED NOT DETECTED Final   Listeria monocytogenes NOT DETECTED NOT DETECTED Final   Staphylococcus species DETECTED (A) NOT DETECTED Final    Comment: CRITICAL RESULT CALLED TO, READ BACK BY AND VERIFIED WITH: NATHAN BELUTH @ 9371 ON 06/13/2021.Marland KitchenMarland KitchenTKR    Staphylococcus aureus (BCID) NOT DETECTED NOT DETECTED Final   Staphylococcus epidermidis NOT DETECTED NOT DETECTED Final   Staphylococcus lugdunensis NOT DETECTED NOT DETECTED Final   Streptococcus species NOT DETECTED NOT DETECTED Final   Streptococcus agalactiae NOT DETECTED NOT DETECTED Final   Streptococcus pneumoniae NOT DETECTED NOT DETECTED Final   Streptococcus pyogenes NOT DETECTED NOT DETECTED Final   A.calcoaceticus-baumannii NOT DETECTED NOT DETECTED Final   Bacteroides fragilis NOT DETECTED NOT DETECTED Final   Enterobacterales NOT DETECTED NOT DETECTED Final   Enterobacter cloacae complex NOT DETECTED NOT DETECTED Final   Escherichia coli NOT DETECTED NOT DETECTED Final   Klebsiella aerogenes NOT DETECTED NOT DETECTED Final   Klebsiella oxytoca NOT DETECTED NOT DETECTED Final   Klebsiella pneumoniae NOT DETECTED NOT DETECTED Final    Proteus species NOT  DETECTED NOT DETECTED Final   Salmonella species NOT DETECTED NOT DETECTED Final   Serratia marcescens NOT DETECTED NOT DETECTED Final   Haemophilus influenzae NOT DETECTED NOT DETECTED Final   Neisseria meningitidis NOT DETECTED NOT DETECTED Final   Pseudomonas aeruginosa NOT DETECTED NOT DETECTED Final   Stenotrophomonas maltophilia NOT DETECTED NOT DETECTED Final   Candida albicans NOT DETECTED NOT DETECTED Final   Candida auris NOT DETECTED NOT DETECTED Final   Candida glabrata NOT DETECTED NOT DETECTED Final   Candida krusei NOT DETECTED NOT DETECTED Final   Candida parapsilosis NOT DETECTED NOT DETECTED Final   Candida tropicalis NOT DETECTED NOT DETECTED Final   Cryptococcus neoformans/gattii NOT DETECTED NOT DETECTED Final    Comment: Performed at [facility], [address] [place], [place] [address]  Culture, blood (routine x 2)     Status: None   Collection Time: 08/11/21  1:01 PM   Specimen: BLOOD  Result Value Ref Range Status   Specimen Description BLOOD RIGHT ARM  Final   Special Requests   Final    BOTTLES DRAWN AEROBIC AND ANAEROBIC Blood Culture adequate volume   Culture   Final    NO GROWTH 5 DAYS Performed at [facility], [address] [place], [place] [address]    Report Status 08/16/2021 FINAL  Final  Resp Panel by RT-PCR (Flu A&B, Covid) Nasopharyngeal Swab     Status: Abnormal   Collection Time: 08/11/21  1:57 PM   Specimen: Nasopharyngeal Swab; Nasopharyngeal(NP) swabs in vial transport medium  Result Value Ref Range Status   SARS Coronavirus 2 by RT PCR POSITIVE (A) NEGATIVE Final    Comment: (NOTE) SARS-CoV-2 target nucleic acids are DETECTED.  The SARS-CoV-2 RNA is generally detectable in upper respiratory specimens during the acute phase of infection. Positive results are indicative of the presence of the identified virus, but do not rule out bacterial infection or co-infection with other pathogens  not detected by the test. Clinical correlation with patient history and other diagnostic information is necessary to determine patient infection status. The expected result is Negative.  Fact Sheet for Patients: EntrepreneurPulse.com.au  Fact Sheet for Healthcare Providers: IncredibleEmployment.be  This test is not yet approved or cleared by the [place] FDA and  has been authorized for detection and/or diagnosis of SARS-CoV-2 by FDA under an Emergency Use Authorization (EUA).  This EUA will remain in effect (meaning this test can be used) for the duration of  the COVID-19 declaration under Section 564(b)(1) of the A ct, 21 U.S.C. section 360bbb-3(b)(1), unless the authorization is terminated or revoked sooner.     Influenza A by PCR NEGATIVE NEGATIVE Final   Influenza B by PCR NEGATIVE NEGATIVE Final    Comment: (NOTE) The Xpert Xpress SARS-CoV-2/FLU/RSV plus assay is intended as an aid in the diagnosis of influenza from Nasopharyngeal swab specimens and should not be used as a sole basis for treatment. Nasal washings and aspirates are unacceptable for Xpert Xpress SARS-CoV-2/FLU/RSV testing.  Fact Sheet for Patients: EntrepreneurPulse.com.au  Fact Sheet for Healthcare Providers: IncredibleEmployment.be  This test is not yet approved or cleared by the [place] FDA and has been authorized for detection and/or diagnosis of SARS-CoV-2 by FDA under an Emergency Use Authorization (EUA). This EUA will remain in effect (meaning this test can be used) for the duration of the COVID-19 declaration under Section 564(b)(1) of the Act, 21 U.S.C. section 360bbb-3(b)(1), unless the authorization is terminated or revoked.  Performed at [facility],  [place], [place] [address]     Procedures and diagnostic studies:  No results found.             LOS: 8 days    Turhan Chill  [facility]   Pager on [email or web address]. If 7PM-7AM, please contact night-coverage at [email or web address]     08/19/2021, 10:47 AM

## 2021-08-19 NOTE — Progress Notes (Addendum)
Nutrition Follow-up  DOCUMENTATION CODES:   Obesity unspecified  INTERVENTION:   -D/c Ensure Enlive po BID, each supplement provides 350 kcal and 20 grams of protein.  -Glucerna Shake po TID, each supplement provides 220 kcal and 10 grams of protein  -MVI with minerals daily  NUTRITION DIAGNOSIS:   Increased nutrient needs related to acute illness as evidenced by estimated needs.  Ongoing  GOAL:   Patient will meet greater than or equal to 90% of their needs  Progressing  MONITOR:   PO intake, Supplement acceptance  REASON FOR ASSESSMENT:   Malnutrition Screening Tool    ASSESSMENT:   Pt with medical history significant for coronary artery disease status post CABG, history of unstable angina, anxiety disorder, chronic diastolic dysfunction CHF, paroxysmal A-fib, hypertension, depression and anxiety as well as diabetes mellitus who presents for evaluation of feeling unwell.  Reviewed I/O's: +240 ml x 24 hours and -2 L since admission   Pt working with physical therapy at time of visit.   Pt with improved oral intake. Noted meal completions 75-100%. Pt is refusing Ensure supplements. RD will adjust supplement regimen secondary to improved oral intake.   Per TOC notes, plan to d/c to SNF at discharge, however, unable to discharge until 08/22/21 due to facility's protocols.   Noted pt's last BM documented on 08/14/21. Miralax just ordered today.   Medications reviewed and include vitamin C, miralax, and zinc sulfate.   Labs reviewed: CBGS: 151-194 (inpatient orders for glycemic control are 0-15 units insulin aspart TID with meals and 10 units insulin glargine-yfgn daily).    Diet Order:   Diet Order             Diet Carb Modified Fluid consistency: Thin; Room service appropriate? Yes  Diet effective now                   EDUCATION NEEDS:   Education needs have been addressed  Skin:  Skin Assessment: Reviewed RN Assessment  Last BM:  08/14/21  Height:    Ht Readings from Last 1 Encounters:  08/11/21 '5\' 2"'$  (1.575 m)    Weight:   Wt Readings from Last 1 Encounters:  08/17/21 81.5 kg    Ideal Body Weight:  50 kg  BMI:  Body mass index is 32.86 kg/m.  Estimated Nutritional Needs:   Kcal:  2952-8413  Protein:  100-115 grams  Fluid:  > 1.7 L    Loistine Chance, RD, LDN, [place] Registered Dietitian II Certified Diabetes Care and Education Specialist Please refer to [facility] for RD and/or RD on-call/weekend/after hours pager

## 2021-08-20 DIAGNOSIS — I5032 Chronic diastolic (congestive) heart failure: Secondary | ICD-10-CM | POA: Diagnosis not present

## 2021-08-20 DIAGNOSIS — I4891 Unspecified atrial fibrillation: Secondary | ICD-10-CM | POA: Diagnosis not present

## 2021-08-20 DIAGNOSIS — R531 Weakness: Secondary | ICD-10-CM | POA: Diagnosis not present

## 2021-08-20 DIAGNOSIS — U071 COVID-19: Secondary | ICD-10-CM | POA: Diagnosis not present

## 2021-08-20 LAB — GLUCOSE, CAPILLARY
Glucose-Capillary: 113 mg/dL — ABNORMAL HIGH (ref 70–99)
Glucose-Capillary: 220 mg/dL — ABNORMAL HIGH (ref 70–99)
Glucose-Capillary: 164 mg/dL — ABNORMAL HIGH (ref 70–99)
Glucose-Capillary: 176 mg/dL — ABNORMAL HIGH (ref 70–99)
Glucose-Capillary: 192 mg/dL — ABNORMAL HIGH (ref 70–99)

## 2021-08-20 NOTE — Progress Notes (Signed)
Progress Note    Candace [name]  GGE:[id number] DOB: 1950/10/13  DOA: 08/11/2021 PCP: [name]., MD      Brief Narrative:    Medical records reviewed and are as summarized below:  [name] is a 71 y.o. female with medical history significant for coronary artery disease status post CABG, history of unstable angina, anxiety disorder, chronic diastolic dysfunction CHF, paroxysmal A-fib, hypertension, depression and anxiety as well as diabetes mellitus who presented to the ER on 08/11/2021 with complaints of 3-day history progressive generalized weakness, myalgias, sore throat, headache and productive cough, chills, poor p.o. intake and diarrhea.  She reported testing positive for COVID-19 on a home test 08/10/2021.  Due to worsening weakness and inability to ambulate at home, EMS was called, patient was found to be hypotensive and transported to the ER.  She was admitted to the hospital for COVID-19 infection.  She was treated with Paxlovid.      Assessment/Plan:   Principal Problem:   COVID-19 Active Problems:   Chronic diastolic CHF (congestive heart failure) (HCC)   Paroxysmal atrial fibrillation (HCC)   Atrial fibrillation with RVR (HCC)   Essential hypertension   Orthostatic hypotension   Hypokalemia   Positive blood culture   Type 2 diabetes mellitus with diabetic polyneuropathy, with long-term current use of insulin (HCC)   Depression with anxiety   Atypical chest pain   Coronary artery disease   Generalized weakness   Obesity (BMI 30-39.9)   History of vertigo   Nutrition Problem: Increased nutrient needs Etiology: acute illness  Signs/Symptoms: estimated needs   Body mass index is 32.58 kg/m.  (Obesity)  Sepsis secondary to COVID-19 infection, cough with pleuritic chest pain: Completed Paxlovid on 08/16/2021.  Repeat chest x-ray on 08/19/2021 did not show any evidence of pneumonia.  Continue antitussives and bronchodilators as  needed.  Atrial fibrillation with RVR: Improved.  Continue metoprolol and Eliquis.    Chronic diastolic CHF, CAD: Continue metoprolol and Plavix  Orthostatic hypotension: Continue midodrine  Positive blood culture: 1 out of 4 bottles showed staph species that is likely a contaminant.  Follow-up blood culture was negative.  Generalized weakness: PT and OT recommended further rehabilitation at SNF  Other comorbidities include depression, anxiety, hypertension, type II DM with diabetic polyneuropathy, vertigo     Diet Order             Diet Carb Modified Fluid consistency: Thin; Room service appropriate? Yes  Diet effective now                          Consultants: None  Procedures: None    Medications:    apixaban  5 mg Oral BID   vitamin C  500 mg Oral Daily   chlorpheniramine-HYDROcodone  5 mL Oral Q12H   clopidogrel  75 mg Oral Daily   dextromethorphan-guaiFENesin  1 tablet Oral BID   diclofenac Sodium  2 g Topical QID   ezetimibe  10 mg Oral Daily   feeding supplement (GLUCERNA SHAKE)  237 mL Oral TID BM   insulin aspart  0-15 Units Subcutaneous TID WC   insulin glargine-yfgn  10 Units Subcutaneous QHS   levalbuterol  1-2 puff Inhalation Q6H WA   metoprolol tartrate  12.5 mg Oral BID   midodrine  5 mg Oral BID WC   multivitamin with minerals  1 tablet Oral Daily   pantoprazole  20 mg Oral BID  polyethylene glycol  17 g Oral Daily   sertraline  100 mg Oral Daily   zinc sulfate  220 mg Oral Daily   Continuous Infusions:   Anti-infectives (From admission, onward)    Start     Dose/Rate Route Frequency Ordered Stop   08/11/21 2200  nirmatrelvir/ritonavir EUA (PAXLOVID) 3 tablet        3 tablet Oral 2 times daily 08/11/21 1708 08/16/21 0936              Family Communication/Anticipated D/C date and plan/Code Status   DVT prophylaxis:  apixaban (ELIQUIS) tablet 5 mg     Code Status: Full Code  Family Communication:  None Disposition Plan: Plan to discharge to SNF on 08/22/2021 after completing COVID isolation protocol   Status is: Inpatient Remains inpatient appropriate because: Awaiting placement to SNF       Subjective:   Interval events noted. C/o cough.  No chest pain or shortness of breath  Objective:    Vitals:   08/19/21 1942 08/20/21 0500 08/20/21 0505 08/20/21 0818  BP: 124/62  128/75 (!) 144/72  Pulse: 77  80 72  Resp: '18  14 17  '$ Temp: 98.1 F (36.7 C)  97.6 F (36.4 C) 98 F (36.7 C)  TempSrc: Oral   Oral  SpO2: 98%  96% 98%  Weight:  80.8 kg    Height:       No data found.   Intake/Output Summary (Last 24 hours) at 08/20/2021 1316 Last data filed at 08/20/2021 1200 Gross per 24 hour  Intake 550 ml  Output --  Net 550 ml   Filed Weights   08/11/21 1238 08/17/21 0544 08/20/21 0500  Weight: 74.8 kg 81.5 kg 80.8 kg    Exam:  GEN: NAD SKIN: Warm and dry EYES: EOMI ENT: MMM CV: RRR PULM: CTA B ABD: soft, ND, NT, +BS CNS: AAO x 3, non focal EXT: No edema or tenderness         Data Reviewed:   I have personally reviewed following labs and imaging studies:  Labs: Labs show the following:   Basic Metabolic Panel: Recent Labs  Lab 08/14/21 0441 08/15/21 0454 08/16/21 0616 08/18/21 0435  NA 140 142 140 139  K 3.7 4.3 4.0 3.9  CL 106 106 103 104  CO2 '23 27 28 27  '$ GLUCOSE 180* 86 99 160*  BUN '13 12 19 17  '$ CREATININE 0.63 0.56 0.66 0.59  CALCIUM 8.6* 8.9 9.0 9.2  MG 1.7 2.0 2.0  --    GFR Estimated Creatinine Clearance: 64.5 mL/min (by C-G formula based on SCr of 0.59 mg/dL). Liver Function Tests: Recent Labs  Lab 08/14/21 0441 08/15/21 0454 08/16/21 0616 08/18/21 0435  AST 30 29 61* 38  ALT 17 17 39 39  ALKPHOS 71 74 85 99  BILITOT 0.5 0.5 0.6 0.4  PROT 6.1* 6.4* 6.6 6.3*  ALBUMIN 3.0* 3.1* 3.3* 3.2*   No results for input(s): LIPASE, AMYLASE in the last 168 hours. No results for input(s): AMMONIA in the last 168  hours. Coagulation profile No results for input(s): INR, PROTIME in the last 168 hours.  CBC: Recent Labs  Lab 08/14/21 0441  WBC 4.7  NEUTROABS 2.2  HGB 12.1  HCT 38.7  MCV 99.2  PLT 151   Cardiac Enzymes: No results for input(s): CKTOTAL, CKMB, CKMBINDEX, TROPONINI in the last 168 hours. BNP (last 3 results) Recent Labs    06/10/21 1603  PROBNP 189   CBG: Recent Labs  Lab 08/19/21 1225 08/19/21 1639 08/19/21 2043 08/20/21 0832 08/20/21 1205  GLUCAP 151* 175* 200* 113* 220*   D-Dimer: No results for input(s): DDIMER in the last 72 hours. Hgb A1c: No results for input(s): HGBA1C in the last 72 hours. Lipid Profile: No results for input(s): CHOL, HDL, LDLCALC, TRIG, CHOLHDL, LDLDIRECT in the last 72 hours. Thyroid function studies: No results for input(s): TSH, T4TOTAL, T3FREE, THYROIDAB in the last 72 hours.  Invalid input(s): FREET3 Anemia work up: No results for input(s): VITAMINB12, FOLATE, FERRITIN, TIBC, IRON, RETICCTPCT in the last 72 hours. Sepsis Labs: Recent Labs  Lab 08/14/21 0441  WBC 4.7    Microbiology Recent Results (from the past 240 hour(s))  Culture, blood (routine x 2)     Status: Abnormal   Collection Time: 08/11/21 12:59 PM   Specimen: BLOOD  Result Value Ref Range Status   Specimen Description   Final    BLOOD RIGHT HAND Performed at [facility], [address] [place], [place] [address]    Special Requests   Final    BOTTLES DRAWN AEROBIC AND ANAEROBIC Blood Culture adequate volume Performed at [facility], [address] [place], [place] [address]    Culture  Setup Time   Final    Organism ID to follow ANAEROBIC BOTTLE ONLY GRAM POSITIVE COCCI CRITICAL RESULT CALLED TO, READ BACK BY AND VERIFIED WITH: NATHAN BELUTH @ 4196 ON 06/13/2021.Marland KitchenMarland KitchenTKR GRAM STAIN REVIEWED-AGREE WITH RESULT    Culture (A)  Final    STAPHYLOCOCCUS HOMINIS THE SIGNIFICANCE OF ISOLATING THIS ORGANISM FROM A SINGLE SET OF BLOOD  CULTURES WHEN MULTIPLE SETS ARE DRAWN IS UNCERTAIN. PLEASE NOTIFY THE MICROBIOLOGY DEPARTMENT WITHIN ONE WEEK IF SPECIATION AND SENSITIVITIES ARE REQUIRED. Performed at [facility], [place] [address], [place],  [address]    Report Status 08/15/2021 FINAL  Final  Blood Culture ID Panel (Reflexed)     Status: Abnormal   Collection Time: 08/11/21 12:59 PM  Result Value Ref Range Status   Enterococcus faecalis NOT DETECTED NOT DETECTED Final   Enterococcus Faecium NOT DETECTED NOT DETECTED Final   Listeria monocytogenes NOT DETECTED NOT DETECTED Final   Staphylococcus species DETECTED (A) NOT DETECTED Final    Comment: CRITICAL RESULT CALLED TO, READ BACK BY AND VERIFIED WITH: NATHAN BELUTH @ 9892 ON 06/13/2021.Marland KitchenMarland KitchenTKR    Staphylococcus aureus (BCID) NOT DETECTED NOT DETECTED Final   Staphylococcus epidermidis NOT DETECTED NOT DETECTED Final   Staphylococcus lugdunensis NOT DETECTED NOT DETECTED Final   Streptococcus species NOT DETECTED NOT DETECTED Final   Streptococcus agalactiae NOT DETECTED NOT DETECTED Final   Streptococcus pneumoniae NOT DETECTED NOT DETECTED Final   Streptococcus pyogenes NOT DETECTED NOT DETECTED Final   A.calcoaceticus-baumannii NOT DETECTED NOT DETECTED Final   Bacteroides fragilis NOT DETECTED NOT DETECTED Final   Enterobacterales NOT DETECTED NOT DETECTED Final   Enterobacter cloacae complex NOT DETECTED NOT DETECTED Final   Escherichia coli NOT DETECTED NOT DETECTED Final   Klebsiella aerogenes NOT DETECTED NOT DETECTED Final   Klebsiella oxytoca NOT DETECTED NOT DETECTED Final   Klebsiella pneumoniae NOT DETECTED NOT DETECTED Final   Proteus species NOT DETECTED NOT DETECTED Final   Salmonella species NOT DETECTED NOT DETECTED Final   Serratia marcescens NOT DETECTED NOT DETECTED Final   Haemophilus influenzae NOT DETECTED NOT DETECTED Final   Neisseria meningitidis NOT DETECTED NOT DETECTED Final   Pseudomonas aeruginosa NOT DETECTED NOT DETECTED  Final   Stenotrophomonas maltophilia NOT DETECTED NOT DETECTED Final  Candida albicans NOT DETECTED NOT DETECTED Final   Candida auris NOT DETECTED NOT DETECTED Final   Candida glabrata NOT DETECTED NOT DETECTED Final   Candida krusei NOT DETECTED NOT DETECTED Final   Candida parapsilosis NOT DETECTED NOT DETECTED Final   Candida tropicalis NOT DETECTED NOT DETECTED Final   Cryptococcus neoformans/gattii NOT DETECTED NOT DETECTED Final    Comment: Performed at [facility], [address] [place], [place] [address]  Culture, blood (routine x 2)     Status: None   Collection Time: 08/11/21  1:01 PM   Specimen: BLOOD  Result Value Ref Range Status   Specimen Description BLOOD RIGHT ARM  Final   Special Requests   Final    BOTTLES DRAWN AEROBIC AND ANAEROBIC Blood Culture adequate volume   Culture   Final    NO GROWTH 5 DAYS Performed at [facility], [address] [place], [place] [address]    Report Status 08/16/2021 FINAL  Final  Resp Panel by RT-PCR (Flu A&B, Covid) Nasopharyngeal Swab     Status: Abnormal   Collection Time: 08/11/21  1:57 PM   Specimen: Nasopharyngeal Swab; Nasopharyngeal(NP) swabs in vial transport medium  Result Value Ref Range Status   SARS Coronavirus 2 by RT PCR POSITIVE (A) NEGATIVE Final    Comment: (NOTE) SARS-CoV-2 target nucleic acids are DETECTED.  The SARS-CoV-2 RNA is generally detectable in upper respiratory specimens during the acute phase of infection. Positive results are indicative of the presence of the identified virus, but do not rule out bacterial infection or co-infection with other pathogens not detected by the test. Clinical correlation with patient history and other diagnostic information is necessary to determine patient infection status. The expected result is Negative.  Fact Sheet for Patients: EntrepreneurPulse.com.au  Fact Sheet for Healthcare  Providers: IncredibleEmployment.be  This test is not yet approved or cleared by the [place] FDA and  has been authorized for detection and/or diagnosis of SARS-CoV-2 by FDA under an Emergency Use Authorization (EUA).  This EUA will remain in effect (meaning this test can be used) for the duration of  the COVID-19 declaration under Section 564(b)(1) of the A ct, 21 U.S.C. section 360bbb-3(b)(1), unless the authorization is terminated or revoked sooner.     Influenza A by PCR NEGATIVE NEGATIVE Final   Influenza B by PCR NEGATIVE NEGATIVE Final    Comment: (NOTE) The Xpert Xpress SARS-CoV-2/FLU/RSV plus assay is intended as an aid in the diagnosis of influenza from Nasopharyngeal swab specimens and should not be used as a sole basis for treatment. Nasal washings and aspirates are unacceptable for Xpert Xpress SARS-CoV-2/FLU/RSV testing.  Fact Sheet for Patients: EntrepreneurPulse.com.au  Fact Sheet for Healthcare Providers: IncredibleEmployment.be  This test is not yet approved or cleared by the [place] FDA and has been authorized for detection and/or diagnosis of SARS-CoV-2 by FDA under an Emergency Use Authorization (EUA). This EUA will remain in effect (meaning this test can be used) for the duration of the COVID-19 declaration under Section 564(b)(1) of the Act, 21 U.S.C. section 360bbb-3(b)(1), unless the authorization is terminated or revoked.  Performed at [facility], [address] [place], [place] [address]     Procedures and diagnostic studies:  DG Chest [facility] 1 View  Result Date: 08/19/2021 CLINICAL DATA:  Cough EXAM: PORTABLE CHEST 1 VIEW COMPARISON:  AP chest 08/11/2021 FINDINGS: Status post median sternotomy and CABG. Cardiac silhouette and mediastinal contours are within normal limits. The lungs are clear. No  pleural effusion or pneumothorax. No acute skeletal abnormality. IMPRESSION:  No acute cardiopulmonary disease process. Electronically Signed   By: Yvonne Kendall M.D.   On: 08/19/2021 18:02               LOS: 9 days   Fleur Audino  [facility]   Pager on [email or web address]. If 7PM-7AM, please contact night-coverage at [email or web address]     08/20/2021, 1:16 PM

## 2021-08-20 NOTE — Progress Notes (Signed)
Physical Therapy Treatment Patient Details Name: LEDORA DELKER MRN: [id number] DOB: [birth year] Today's Date: 08/20/2021   History of Present Illness Patient is a 71 year old female with COVID-19 presenting with complaints of profound generalized weakness, diarrhea, myalgias, sore throat, headache, cough productive of yellow phlegm and chills. Sepsis due to COVID-19 with hypotension and tachycardia.    PT Comments    Pt was long sitting in bed upon arriving. She is A and O x 3, agreeable to session and is cooperative throughout. Breakfast tray arrived during session which limited session progression. She agrees to OOB activity. Continues to require increased time and min-mod assist to exit bed. Sat EOB without c/o dizziness this date. Session progressed to standing and ambulating short distance. Pt is still not at baseline function. Recommend DC to SNF to address deficits while assisting pt to maximal independence with ADLs.     Recommendations for follow up therapy are one component of a multi-disciplinary discharge planning process, led by the attending physician.  Recommendations may be updated based on patient status, additional functional criteria and insurance authorization.  Follow Up Recommendations  Skilled nursing-short term rehab (<3 hours/day)     Assistance Recommended at Discharge Intermittent Supervision/Assistance  Patient can return home with the following Assist for transportation;Help with stairs or ramp for entrance;Assistance with cooking/housework;A little help with walking and/or transfers;A little help with bathing/dressing/bathroom   Equipment Recommendations  Rolling walker (2 wheels);BSC/3in1;Other (comment) (may benefit form WC for longer community distances)       Precautions / Restrictions Precautions Precautions: Fall Restrictions Weight Bearing Restrictions: No     Mobility  Bed Mobility Overal bed mobility: Needs Assistance Bed Mobility: Supine to  Sit  Supine to sit: Min assist, Mod assist  General bed mobility comments: Increased time to perform but continues to require min-mod assist to achieve EOB short sit. Sat EOB x several minutes prior to standing and ambulating.    Transfers Overall transfer level: Needs assistance Equipment used: Rolling walker (2 wheels) Transfers: Sit to/from Stand Sit to Stand: Min assist, Min guard    General transfer comment: CGA for elevated surface heights but min assist from standard lower heights    Ambulation/Gait Ambulation/Gait assistance: Min guard Gait Distance (Feet): 5 Feet Assistive device: Rolling walker (2 wheels) Gait Pattern/deviations: Step-through pattern, Decreased step length - right, Decreased step length - left, Narrow base of support Gait velocity: decreased     General Gait Details: pt only ambulated ~ 5 ft from bed to recliner due to breakfast tray arriving and requesting to eat. Will continue to progress gait as able per pt tolerance.    Balance Overall balance assessment: Needs assistance, History of Falls Sitting-balance support: Feet supported Sitting balance-[name] Scale: Fair     Standing balance support: Bilateral upper extremity supported Standing balance-[name] Scale: Fair       Cognition Arousal/Alertness: Awake/alert Behavior During Therapy: WFL for tasks assessed/performed Overall Cognitive Status: Within Functional Limits for tasks assessed    General Comments: Pt was A and O x 3. Agrees to session but limited due to breakfast tray arriving           General Comments General comments (skin integrity, edema, etc.): Chief Strategy Officer discussed with CM/patient/pt's daughter about DC disposition. Pt and daughter are agreeable to rehab and feel its best due to pt being alone throughout most of the day at home. CM to perform bedsearch      Pertinent Vitals/Pain Pain Assessment Pain Assessment: No/denies pain Pain Score:  0-No pain Pain Location: denies pain  today Pain Intervention(s): Limited activity within patient's tolerance     PT Goals (current goals can now be found in the care plan section) Acute Rehab PT Goals Patient Stated Goal: rehab then home Progress towards PT goals: Progressing toward goals    Frequency    Min 2X/week      PT Plan Current plan remains appropriate       AM-PAC PT "6 Clicks" Mobility   Outcome Measure  Help needed turning from your back to your side while in a flat bed without using bedrails?: None Help needed moving from lying on your back to sitting on the side of a flat bed without using bedrails?: A Lot Help needed moving to and from a bed to a chair (including a wheelchair)?: A Little Help needed standing up from a chair using your arms (e.g., wheelchair or bedside chair)?: A Little Help needed to walk in hospital room?: A Lot Help needed climbing 3-5 steps with a railing? : A Lot 6 Click Score: 16    End of Session   Activity Tolerance: Patient tolerated treatment well;Patient limited by fatigue Patient left: in chair;with call bell/phone within reach;with chair alarm set;with family/visitor present Nurse Communication: Mobility status;Precautions PT Visit Diagnosis: Unsteadiness on feet (R26.81);Muscle weakness (generalized) (M62.81);History of falling (Z91.81)     Time: 6644-0347 PT Time Calculation (min) (ACUTE ONLY): 17 min  Charges:  $Therapeutic Activity: 8-22 mins                     Julaine Fusi PTA 08/20/21, 10:05 AM

## 2021-08-20 NOTE — TOC Progression Note (Addendum)
Transition of Care [facility]) - Progression Note    Patient Details  Name: Candace [name] MRN: [id number] Date of Birth: 13-Oct-1950  [facility]) CM/SW Contact  Eileen Stanford, LCSW Phone Number: 08/20/2021, 9:31 AM  Clinical Narrative:   Pt is now on board with SNF. Pt ask that CSW provide bed offers to her daughter [name]. Offers provided to [name] and she chooses [facility] Per Juliann Pulse at [facility] pt can admit this Saturday (due to COVID 10 day rule) as long as they have the DC summary by Friday. Pt's daughter and MD aware.   [employer] auth started (5/25, 9:56 AM) for admit date (5/27).  Got auth for pt to admit to [facility] on Saturday (if MD is agreeable to do the summary 5/26). If unable to do so pt will have to wait to admit on Monday 5/29. SNF provided auth information.  Expected Discharge Plan: [place] Barriers to Discharge: Continued Medical Work up  Expected Discharge Plan and Services Expected Discharge Plan: [place] Choice: [place] arrangements for the past 2 months: Single Family Home                                       Social Determinants of Health (SDOH) Interventions    Readmission Risk Interventions     View : No data to display.

## 2021-08-20 NOTE — Progress Notes (Signed)
Occupational Therapy Treatment Patient Details Name: Candace [name] MRN: [id number] DOB: 07-25-1950 Today's Date: 08/20/2021   History of present illness Patient is a 71 year old female with COVID-19 presenting with complaints of profound generalized weakness, diarrhea, myalgias, sore throat, headache, cough productive of yellow phlegm and chills. Sepsis due to COVID-19 with hypotension and tachycardia.   OT comments  Upon entering the room, pt seated in recliner chair and agreeable to OT intervention. Pt finished with breakfast. Pt able to stand from recliner chair surface with min A and ambulates with RW in room 10' back to chair with min guard. Pt taking seated rest break and then perform this x 2 additional reps. Pt reports goal is to return home but realizes she needs rehab before returning. Pt fatigued at this point and returns to sit in recliner chair with chair alarm activated and all needed items within reach.  Pt continues to make progress towards goals.    Recommendations for follow up therapy are one component of a multi-disciplinary discharge planning process, led by the attending physician.  Recommendations may be updated based on patient status, additional functional criteria and insurance authorization.    Follow Up Recommendations  Skilled nursing-short term rehab (<3 hours/day)    Assistance Recommended at Discharge Frequent or constant Supervision/Assistance  Patient can return home with the following  Help with stairs or ramp for entrance;Assistance with cooking/housework;Assist for transportation;A Candace help with walking and/or transfers;A Candace help with bathing/dressing/bathroom   Equipment Recommendations  BSC/3in1       Precautions / Restrictions Precautions Precautions: Fall Restrictions Weight Bearing Restrictions: No       Mobility Bed Mobility               General bed mobility comments: seated in recliner chair    Transfers Overall transfer  level: Needs assistance Equipment used: Rolling walker (2 wheels) Transfers: Sit to/from Stand Sit to Stand: Min assist, Min guard           General transfer comment: pt progessing to min guard  from recliner chair     Balance Overall balance assessment: Needs assistance, History of Falls Sitting-balance support: Feet supported Sitting balance-[name] Scale: Good     Standing balance support: Bilateral upper extremity supported Standing balance-[name] Scale: Fair                             ADL either performed or assessed with clinical judgement    Extremity/Trunk Assessment Upper Extremity Assessment Upper Extremity Assessment: Generalized weakness   Lower Extremity Assessment Lower Extremity Assessment: Generalized weakness        Vision Patient Visual Report: No change from baseline            Cognition Arousal/Alertness: Awake/alert Behavior During Therapy: WFL for tasks assessed/performed Overall Cognitive Status: Within Functional Limits for tasks assessed                                 General Comments: Pt is pleasant and cooperative        Exercises      Shoulder Instructions       General Comments Author discussed with CM/patient/pt's daughter about DC disposition. Pt and daughter are agreeable to rehab and feel its best due to pt being alone throughout most of the day at home. CM to perform bedsearch    Pertinent Vitals/ Pain  Pain Assessment Pain Assessment: No/denies pain         Frequency  Min 2X/week        Progress Toward Goals  OT Goals(current goals can now be found in the care plan section)  Progress towards OT goals: Progressing toward goals  Acute Rehab OT Goals Patient Stated Goal: to get better OT Goal Formulation: With patient Time For Goal Achievement: 08/27/21 Potential to Achieve Goals: [place] Discharge plan remains appropriate;Frequency remains appropriate       AM-PAC OT  "6 Clicks" Daily Activity     Outcome Measure   Help from another person eating meals?: None Help from another person taking care of personal grooming?: A Candace Help from another person toileting, which includes using toliet, bedpan, or urinal?: A Lot Help from another person bathing (including washing, rinsing, drying)?: A Candace Help from another person to put on and taking off regular upper body clothing?: None Help from another person to put on and taking off regular lower body clothing?: A Lot 6 Click Score: 18    End of Session Equipment Utilized During Treatment: Rolling walker (2 wheels)  OT Visit Diagnosis: Unsteadiness on feet (R26.81);Muscle weakness (generalized) (M62.81)   Activity Tolerance Patient tolerated treatment well;Patient limited by fatigue   Patient Left with call bell/phone within reach;in chair;with chair alarm set   Nurse Communication Mobility status        Time: 3235-5732 OT Time Calculation (min): 16 min  Charges: OT General Charges $OT Visit: 1 Visit OT Treatments $Therapeutic Activity: 8-22 mins  Darleen Crocker, MS, OTR/L , CBIS ascom [phone]  08/20/21, 3:01 PM

## 2021-08-21 DIAGNOSIS — U071 COVID-19: Secondary | ICD-10-CM | POA: Diagnosis not present

## 2021-08-21 LAB — GLUCOSE, CAPILLARY
Glucose-Capillary: 143 mg/dL — ABNORMAL HIGH (ref 70–99)
Glucose-Capillary: 144 mg/dL — ABNORMAL HIGH (ref 70–99)
Glucose-Capillary: 210 mg/dL — ABNORMAL HIGH (ref 70–99)
Glucose-Capillary: 179 mg/dL — ABNORMAL HIGH (ref 70–99)

## 2021-08-21 MED ORDER — LANTUS SOLOSTAR 100 UNIT/ML ~~LOC~~ SOPN: 12.0000 [IU] | PEN_INJECTOR | Freq: Every day | SUBCUTANEOUS | 3 refills | Status: AC

## 2021-08-21 NOTE — Progress Notes (Signed)
Occupational Therapy Treatment Patient Details Name: Candace [name] MRN: [id number] DOB: [birth year] Today's Date: 08/21/2021   History of present illness Patient is a 71 year old female with COVID-19 presenting with complaints of profound generalized weakness, diarrhea, myalgias, sore throat, headache, cough productive of yellow phlegm and chills. Sepsis due to COVID-19 with hypotension and tachycardia.   OT comments  Upon entering the room, pt seated in recliner chair and agreeable to OT intervention. Pt has been having hypotension and B ace wraps donned on LEs this session in all attempt to stabilize BP for functional mobility. Pt able to stand from recliner chair and ambulates with min guard and use of RW to bathroom. Pt seated on commode and able to void. She performs hygiene with min guard for balance in standing and able to remain standing for hand hygiene before returning 15' to recliner chair. Pt does not report dizziness but reports LE's feel "like lead". Pt remaining in recliner chair and RN discussing B LE wraps and RN to doff this evening. Pt continues to benefit from OT intervention.    Recommendations for follow up therapy are one component of a multi-disciplinary discharge planning process, led by the attending physician.  Recommendations may be updated based on patient status, additional functional criteria and insurance authorization.    Follow Up Recommendations  Skilled nursing-short term rehab (<3 hours/day)    Assistance Recommended at Discharge Frequent or constant Supervision/Assistance  Patient can return home with the following  Help with stairs or ramp for entrance;Assistance with cooking/housework;Assist for transportation;A little help with walking and/or transfers;A little help with bathing/dressing/bathroom   Equipment Recommendations  BSC/3in1       Precautions / Restrictions Precautions Precautions: Fall Restrictions Weight Bearing Restrictions: No        Mobility Bed Mobility               General bed mobility comments: seated in recliner chair    Transfers Overall transfer level: Needs assistance Equipment used: Rolling walker (2 wheels) Transfers: Sit to/from Stand Sit to Stand: Min assist, Min guard                 Balance Overall balance assessment: Needs assistance, History of Falls Sitting-balance support: Feet supported Sitting balance-[name] Scale: Good     Standing balance support: Bilateral upper extremity supported Standing balance-[name] Scale: Fair Standing balance comment: much improved today.                           ADL either performed or assessed with clinical judgement   ADL Overall ADL's : Needs assistance/impaired     Grooming: Wash/dry hands;Min guard;Standing                       Toileting- Water quality scientist and Hygiene: Min guard;Sit to/from stand       Functional mobility during ADLs: Min guard      Extremity/Trunk Assessment Upper Extremity Assessment Upper Extremity Assessment: Generalized weakness   Lower Extremity Assessment Lower Extremity Assessment: Generalized weakness        Vision Patient Visual Report: No change from baseline            Cognition Arousal/Alertness: Awake/alert Behavior During Therapy: WFL for tasks assessed/performed Overall Cognitive Status: Within Functional Limits for tasks assessed  General Comments: Pt is pleasant and cooperative                   Pertinent Vitals/ Pain       Pain Assessment Pain Assessment: No/denies pain         Frequency  Min 2X/week        Progress Toward Goals  OT Goals(current goals can now be found in the care plan section)  Progress towards OT goals: Progressing toward goals  Acute Rehab OT Goals Patient Stated Goal: to go home OT Goal Formulation: With patient Time For Goal Achievement: 08/27/21 Potential to  Achieve Goals: [place] Discharge plan remains appropriate;Frequency remains appropriate       AM-PAC OT "6 Clicks" Daily Activity     Outcome Measure   Help from another person eating meals?: None Help from another person taking care of personal grooming?: A Little Help from another person toileting, which includes using toliet, bedpan, or urinal?: A Lot Help from another person bathing (including washing, rinsing, drying)?: A Little Help from another person to put on and taking off regular upper body clothing?: None Help from another person to put on and taking off regular lower body clothing?: A Lot 6 Click Score: 18    End of Session Equipment Utilized During Treatment: Rolling walker (2 wheels)  OT Visit Diagnosis: Unsteadiness on feet (R26.81);Muscle weakness (generalized) (M62.81)   Activity Tolerance Patient tolerated treatment well;Patient limited by fatigue   Patient Left with call bell/phone within reach;in chair;with chair alarm set   Nurse Communication Mobility status        Time: 6606-3016 OT Time Calculation (min): 25 min  Charges: OT General Charges $OT Visit: 1 Visit OT Treatments $Self Care/Home Management : 23-37 mins  Darleen Crocker, MS, OTR/L , CBIS ascom [phone]  08/21/21, 12:58 PM

## 2021-08-21 NOTE — TOC Progression Note (Addendum)
Transition of Care [facility]) - Progression Note    Patient Details  Name: Candace [name] MRN: [id number] Date of Birth: 07/25/1950  Transition of [facility] CM/SW [place], LCSW Phone Number: 08/21/2021, 10:42 AM  Clinical Narrative:   Per MD, MD will put in DC Summary today in preparation for DC to [facility] tomorrow as requested by [facility]. CSW left VM for Sharyn Lull at [facility] to notify her and asked for contact person for Saturday DC. Awaiting return call.   1:45- Sharyn Lull with [place] confirmed she can take patient tomorrow. DC Summary has been sent in the [place]. Sharyn Lull stated she is the contact person for DC tomorrow at [phone]. TOC handoff updated.    Expected Discharge Plan: [place] Barriers to Discharge: Continued Medical Work up  Expected Discharge Plan and Services Expected Discharge Plan: [place] Choice: [place] arrangements for the past 2 months: Single Family Home                                       Social Determinants of Health (SDOH) Interventions    Readmission Risk Interventions     View : No data to display.

## 2021-08-21 NOTE — Progress Notes (Signed)
Physical Therapy Treatment Patient Details Name: Candace [name] MRN: [id number] DOB: 10/24/1950 Today's Date: 08/21/2021   History of Present Illness Patient is a 71 year old female with COVID-19 presenting with complaints of profound generalized weakness, diarrhea, myalgias, sore throat, headache, cough productive of yellow phlegm and chills. Sepsis due to COVID-19 with hypotension and tachycardia.    PT Comments    Pt was sitting in recliner upon arriving. She agrees to session and is cooperative throughout. " I feel a little better every day." She was able to stand and ambulated to doorway. Requires several standing rest breaks with minimal gait distances. " My vision gets dark/black when I'm ambulating."  BP once returned to recliner 111/ 72. Symptoms quickly resolved. PT continues to recommend DC to SNF to address deficits while maximizing independence with ADLs.     Recommendations for follow up therapy are one component of a multi-disciplinary discharge planning process, led by the attending physician.  Recommendations may be updated based on patient status, additional functional criteria and insurance authorization.  Follow Up Recommendations  Skilled nursing-short term rehab (<3 hours/day)     Assistance Recommended at Discharge Intermittent Supervision/Assistance  Patient can return home with the following Assist for transportation;Help with stairs or ramp for entrance;Assistance with cooking/housework;A little help with walking and/or transfers;A little help with bathing/dressing/bathroom   Equipment Recommendations  Rolling walker (2 wheels);BSC/3in1;Other (comment)       Precautions / Restrictions Precautions Precautions: Fall Restrictions Weight Bearing Restrictions: No     Mobility  Bed Mobility    General bed mobility comments: pt was in recliner pre/post session    Transfers Overall transfer level: Needs assistance Equipment used: Rolling walker (2  wheels) Transfers: Sit to/from Stand Sit to Stand: Min guard  General transfer comment: pt was able to stand from recliner with vcs + CGA for safety. she demonstrates improved abilities overall however does continue to be limited by fatigue/activity tolerance    Ambulation/Gait Ambulation/Gait assistance: Min guard Gait Distance (Feet): 25 Feet Assistive device: Rolling walker (2 wheels) Gait Pattern/deviations: Step-through pattern, Decreased step length - right, Decreased step length - left, Narrow base of support Gait velocity: decreased     General Gait Details: Pt was able to ambulate ~ 25 ft with RW howevere required ~ 3 standing rest. C/o "vision getting black." Once in sitting, BP 111/72 and symptoms resolved quickly   Balance Overall balance assessment: Needs assistance, History of Falls Sitting-balance support: Feet supported Sitting balance-[name] Scale: Good     Standing balance support: Bilateral upper extremity supported Standing balance-[name] Scale: Fair Standing balance comment: high fall risk. Has had a few falls in past year       Cognition Arousal/Alertness: Awake/alert Behavior During Therapy: WFL for tasks assessed/performed Overall Cognitive Status: Within Functional Limits for tasks assessed    General Comments: Pt is pleasant and cooperative               Pertinent Vitals/Pain Pain Assessment Pain Assessment: No/denies pain Pain Score: 0-No pain     PT Goals (current goals can now be found in the care plan section) Acute Rehab PT Goals Patient Stated Goal: rehab then home Progress towards PT goals: Progressing toward goals    Frequency    Min 2X/week      PT Plan Current plan remains appropriate       AM-PAC PT "6 Clicks" Mobility   Outcome Measure  Help needed turning from your back to your side while in a  flat bed without using bedrails?: None Help needed moving from lying on your back to sitting on the side of a flat bed  without using bedrails?: A Little Help needed moving to and from a bed to a chair (including a wheelchair)?: A Little Help needed standing up from a chair using your arms (e.g., wheelchair or bedside chair)?: A Little Help needed to walk in hospital room?: A Little Help needed climbing 3-5 steps with a railing? : A Lot 6 Click Score: 18    End of Session   Activity Tolerance: Patient tolerated treatment well;Patient limited by fatigue Patient left: in chair;with call bell/phone within reach;with chair alarm set;with family/visitor present Nurse Communication: Mobility status;Precautions PT Visit Diagnosis: Unsteadiness on feet (R26.81);Muscle weakness (generalized) (M62.81);History of falling (Z91.81)     Time: 5750-5183 PT Time Calculation (min) (ACUTE ONLY): 39 min  Charges:  $Gait Training: 23-37 mins $Therapeutic Activity: 8-22 mins                    Julaine Fusi PTA 08/21/21, 1:40 PM

## 2021-08-21 NOTE — Progress Notes (Signed)
Progress Note    Candace [name]  ERD:[id number] DOB: 1950-10-14  DOA: 08/11/2021 PCP: [name]., MD      Brief Narrative:    Medical records reviewed and are as summarized below:  [name] is a 71 y.o. female with medical history significant for coronary artery disease status post CABG, history of unstable angina, anxiety disorder, chronic diastolic dysfunction CHF, paroxysmal A-fib, hypertension, depression and anxiety as well as diabetes mellitus who presented to the ER on 08/11/2021 with complaints of 3-day history progressive generalized weakness, myalgias, sore throat, headache and productive cough, chills, poor p.o. intake and diarrhea.  She reported testing positive for COVID-19 on a home test 08/10/2021.  Due to worsening weakness and inability to ambulate at home, EMS was called, patient was found to be hypotensive and transported to the ER.  She was admitted to the hospital for COVID-19 infection.  She was treated with Paxlovid.      Assessment/Plan:   Principal Problem:   COVID-19 Active Problems:   Chronic diastolic CHF (congestive heart failure) (HCC)   Paroxysmal atrial fibrillation (HCC)   Atrial fibrillation with RVR (HCC)   Essential hypertension   Orthostatic hypotension   Hypokalemia   Positive blood culture   Type 2 diabetes mellitus with diabetic polyneuropathy, with long-term current use of insulin (HCC)   Depression with anxiety   Atypical chest pain   Coronary artery disease   Generalized weakness   Obesity (BMI 30-39.9)   History of vertigo   Nutrition Problem: Increased nutrient needs Etiology: acute illness  Signs/Symptoms: estimated needs   Body mass index is 32.58 kg/m.  (Obesity)  Sepsis secondary to COVID-19 infection, cough with pleuritic chest pain: Completed Paxlovid on 08/16/2021.  Chest pain is better.  Continue antitussives as needed for cough.  Atrial fibrillation with RVR: Heart rate is okay.   Continue metoprolol and Eliquis.  Chronic diastolic CHF, CAD: Continue metoprolol and Plavix.  Patient said she had been taken off Lasix for some time now.  Orthostatic hypotension: Blood pressure is better.  Discontinue midodrine.  Positive blood culture: 1 out of 4 bottles showed staph species that is likely a contaminant.  Follow-up blood culture was negative.  Generalized weakness: PT and OT recommended further rehabilitation at SNF  Other comorbidities include depression, anxiety, hypertension, type II DM with diabetic polyneuropathy, vertigo     Diet Order             Diet - low sodium heart healthy           Diet Carb Modified           Diet Carb Modified Fluid consistency: Thin; Room service appropriate? Yes  Diet effective now                          Consultants: None  Procedures: None    Medications:    apixaban  5 mg Oral BID   vitamin C  500 mg Oral Daily   chlorpheniramine-HYDROcodone  5 mL Oral Q12H   clopidogrel  75 mg Oral Daily   dextromethorphan-guaiFENesin  1 tablet Oral BID   diclofenac Sodium  2 g Topical QID   ezetimibe  10 mg Oral Daily   feeding supplement (GLUCERNA SHAKE)  237 mL Oral TID BM   insulin aspart  0-15 Units Subcutaneous TID WC   insulin glargine-yfgn  10 Units Subcutaneous QHS   levalbuterol  1-2 puff  Inhalation Q6H WA   metoprolol tartrate  12.5 mg Oral BID   multivitamin with minerals  1 tablet Oral Daily   pantoprazole  20 mg Oral BID   polyethylene glycol  17 g Oral Daily   sertraline  100 mg Oral Daily   zinc sulfate  220 mg Oral Daily   Continuous Infusions:   Anti-infectives (From admission, onward)    Start     Dose/Rate Route Frequency Ordered Stop   08/11/21 2200  nirmatrelvir/ritonavir EUA (PAXLOVID) 3 tablet        3 tablet Oral 2 times daily 08/11/21 1708 08/16/21 0936              Family Communication/Anticipated D/C date and plan/Code Status   DVT prophylaxis:  apixaban  (ELIQUIS) tablet 5 mg     Code Status: Full Code  Family Communication: None Disposition Plan: Plan to discharge to SNF on 08/22/2021 after completing COVID isolation protocol   Status is: Inpatient Remains inpatient appropriate because: Awaiting placement to SNF       Subjective:   Interval events noted.  She feels much better today.  She has had occasional cough.  No chest pain or shortness of breath  Objective:    Vitals:   08/20/21 1600 08/20/21 2055 08/21/21 0433 08/21/21 0822  BP: (!) 144/86 127/70 128/72 (!) 149/89  Pulse: 78 81 73 82  Resp: '19 18 18 16  '$ Temp: 98.1 F (36.7 C) 98.1 F (36.7 C) 97.9 F (36.6 C) 98.2 F (36.8 C)  TempSrc: Oral     SpO2: 100% 98% 96% 93%  Weight:      Height:       No data found.   Intake/Output Summary (Last 24 hours) at 08/21/2021 1233 Last data filed at 08/20/2021 1700 Gross per 24 hour  Intake 250 ml  Output --  Net 250 ml   Filed Weights   08/11/21 1238 08/17/21 0544 08/20/21 0500  Weight: 74.8 kg 81.5 kg 80.8 kg    Exam:  GEN: NAD SKIN: No rash EYES: EOMI ENT: MMM CV: RRR PULM: CTA B ABD: soft, ND, NT, +BS CNS: AAO x 3, non focal EXT: No edema or tenderness         Data Reviewed:   I have personally reviewed following labs and imaging studies:  Labs: Labs show the following:   Basic Metabolic Panel: Recent Labs  Lab 08/15/21 0454 08/16/21 0616 08/18/21 0435  NA 142 140 139  K 4.3 4.0 3.9  CL 106 103 104  CO2 '27 28 27  '$ GLUCOSE 86 99 160*  BUN '12 19 17  '$ CREATININE 0.56 0.66 0.59  CALCIUM 8.9 9.0 9.2  MG 2.0 2.0  --    GFR Estimated Creatinine Clearance: 64.5 mL/min (by C-G formula based on SCr of 0.59 mg/dL). Liver Function Tests: Recent Labs  Lab 08/15/21 0454 08/16/21 0616 08/18/21 0435  AST 29 61* 38  ALT 17 39 39  ALKPHOS 74 85 99  BILITOT 0.5 0.6 0.4  PROT 6.4* 6.6 6.3*  ALBUMIN 3.1* 3.3* 3.2*   No results for input(s): LIPASE, AMYLASE in the last 168 hours. No  results for input(s): AMMONIA in the last 168 hours. Coagulation profile No results for input(s): INR, PROTIME in the last 168 hours.  CBC: No results for input(s): WBC, NEUTROABS, HGB, HCT, MCV, PLT in the last 168 hours.  Cardiac Enzymes: No results for input(s): CKTOTAL, CKMB, CKMBINDEX, TROPONINI in the last 168 hours. BNP (last 3 results)  Recent Labs    06/10/21 1603  PROBNP 189   CBG: Recent Labs  Lab 08/20/21 1205 08/20/21 1548 08/20/21 2037 08/20/21 2058 08/21/21 0823  GLUCAP 220* 164* 192* 176* 143*   D-Dimer: No results for input(s): DDIMER in the last 72 hours. Hgb A1c: No results for input(s): HGBA1C in the last 72 hours. Lipid Profile: No results for input(s): CHOL, HDL, LDLCALC, TRIG, CHOLHDL, LDLDIRECT in the last 72 hours. Thyroid function studies: No results for input(s): TSH, T4TOTAL, T3FREE, THYROIDAB in the last 72 hours.  Invalid input(s): FREET3 Anemia work up: No results for input(s): VITAMINB12, FOLATE, FERRITIN, TIBC, IRON, RETICCTPCT in the last 72 hours. Sepsis Labs: No results for input(s): PROCALCITON, WBC, LATICACIDVEN in the last 168 hours.   Microbiology Recent Results (from the past 240 hour(s))  Culture, blood (routine x 2)     Status: Abnormal   Collection Time: 08/11/21 12:59 PM   Specimen: BLOOD  Result Value Ref Range Status   Specimen Description   Final    BLOOD RIGHT HAND Performed at [facility], [address] [place], [place] [address]    Special Requests   Final    BOTTLES DRAWN AEROBIC AND ANAEROBIC Blood Culture adequate volume Performed at [facility], [address] [place], [place] [address]    Culture  Setup Time   Final    Organism ID to follow ANAEROBIC BOTTLE ONLY GRAM POSITIVE COCCI CRITICAL RESULT CALLED TO, READ BACK BY AND VERIFIED WITH: NATHAN BELUTH @ [id number] ON 06/13/2021.Marland KitchenMarland KitchenTKR GRAM STAIN REVIEWED-AGREE WITH RESULT    Culture (A)  Final    STAPHYLOCOCCUS HOMINIS THE  SIGNIFICANCE OF ISOLATING THIS ORGANISM FROM A SINGLE SET OF BLOOD CULTURES WHEN MULTIPLE SETS ARE DRAWN IS UNCERTAIN. PLEASE NOTIFY THE MICROBIOLOGY DEPARTMENT WITHIN ONE WEEK IF SPECIATION AND SENSITIVITIES ARE REQUIRED. Performed at [facility], [place] [address] [place], [place] [address]    Report Status 08/15/2021 FINAL  Final  Blood Culture ID Panel (Reflexed)     Status: Abnormal   Collection Time: 08/11/21 12:59 PM  Result Value Ref Range Status   Enterococcus faecalis NOT DETECTED NOT DETECTED Final   Enterococcus Faecium NOT DETECTED NOT DETECTED Final   Listeria monocytogenes NOT DETECTED NOT DETECTED Final   Staphylococcus species DETECTED (A) NOT DETECTED Final    Comment: CRITICAL RESULT CALLED TO, READ BACK BY AND VERIFIED WITH: NATHAN BELUTH @ [id number] ON 06/13/2021.Marland KitchenMarland KitchenTKR    Staphylococcus aureus (BCID) NOT DETECTED NOT DETECTED Final   Staphylococcus epidermidis NOT DETECTED NOT DETECTED Final   Staphylococcus lugdunensis NOT DETECTED NOT DETECTED Final   Streptococcus species NOT DETECTED NOT DETECTED Final   Streptococcus agalactiae NOT DETECTED NOT DETECTED Final   Streptococcus pneumoniae NOT DETECTED NOT DETECTED Final   Streptococcus pyogenes NOT DETECTED NOT DETECTED Final   A.calcoaceticus-baumannii NOT DETECTED NOT DETECTED Final   Bacteroides fragilis NOT DETECTED NOT DETECTED Final   Enterobacterales NOT DETECTED NOT DETECTED Final   Enterobacter cloacae complex NOT DETECTED NOT DETECTED Final   Escherichia coli NOT DETECTED NOT DETECTED Final   Klebsiella aerogenes NOT DETECTED NOT DETECTED Final   Klebsiella oxytoca NOT DETECTED NOT DETECTED Final   Klebsiella pneumoniae NOT DETECTED NOT DETECTED Final   Proteus species NOT DETECTED NOT DETECTED Final   Salmonella species NOT DETECTED NOT DETECTED Final   Serratia marcescens NOT DETECTED NOT DETECTED Final   Haemophilus influenzae NOT DETECTED NOT DETECTED Final   Neisseria meningitidis NOT DETECTED NOT  DETECTED Final  Pseudomonas aeruginosa NOT DETECTED NOT DETECTED Final   Stenotrophomonas maltophilia NOT DETECTED NOT DETECTED Final   Candida albicans NOT DETECTED NOT DETECTED Final   Candida auris NOT DETECTED NOT DETECTED Final   Candida glabrata NOT DETECTED NOT DETECTED Final   Candida krusei NOT DETECTED NOT DETECTED Final   Candida parapsilosis NOT DETECTED NOT DETECTED Final   Candida tropicalis NOT DETECTED NOT DETECTED Final   Cryptococcus neoformans/gattii NOT DETECTED NOT DETECTED Final    Comment: Performed at [facility], [address] [place], [place] [address]  Culture, blood (routine x 2)     Status: None   Collection Time: 08/11/21  1:01 PM   Specimen: BLOOD  Result Value Ref Range Status   Specimen Description BLOOD RIGHT ARM  Final   Special Requests   Final    BOTTLES DRAWN AEROBIC AND ANAEROBIC Blood Culture adequate volume   Culture   Final    NO GROWTH 5 DAYS Performed at [facility], [address] [place], [place] [address]    Report Status 08/16/2021 FINAL  Final  Resp Panel by RT-PCR (Flu A&B, Covid) Nasopharyngeal Swab     Status: Abnormal   Collection Time: 08/11/21  1:57 PM   Specimen: Nasopharyngeal Swab; Nasopharyngeal(NP) swabs in vial transport medium  Result Value Ref Range Status   SARS Coronavirus 2 by RT PCR POSITIVE (A) NEGATIVE Final    Comment: (NOTE) SARS-CoV-2 target nucleic acids are DETECTED.  The SARS-CoV-2 RNA is generally detectable in upper respiratory specimens during the acute phase of infection. Positive results are indicative of the presence of the identified virus, but do not rule out bacterial infection or co-infection with other pathogens not detected by the test. Clinical correlation with patient history and other diagnostic information is necessary to determine patient infection status. The expected result is Negative.  Fact Sheet for  Patients: EntrepreneurPulse.com.au  Fact Sheet for Healthcare Providers: IncredibleEmployment.be  This test is not yet approved or cleared by the [place] FDA and  has been authorized for detection and/or diagnosis of SARS-CoV-2 by FDA under an Emergency Use Authorization (EUA).  This EUA will remain in effect (meaning this test can be used) for the duration of  the COVID-19 declaration under Section 564(b)(1) of the A ct, 21 U.S.C. section 360bbb-3(b)(1), unless the authorization is terminated or revoked sooner.     Influenza A by PCR NEGATIVE NEGATIVE Final   Influenza B by PCR NEGATIVE NEGATIVE Final    Comment: (NOTE) The Xpert Xpress SARS-CoV-2/FLU/RSV plus assay is intended as an aid in the diagnosis of influenza from Nasopharyngeal swab specimens and should not be used as a sole basis for treatment. Nasal washings and aspirates are unacceptable for Xpert Xpress SARS-CoV-2/FLU/RSV testing.  Fact Sheet for Patients: EntrepreneurPulse.com.au  Fact Sheet for Healthcare Providers: IncredibleEmployment.be  This test is not yet approved or cleared by the [place] FDA and has been authorized for detection and/or diagnosis of SARS-CoV-2 by FDA under an Emergency Use Authorization (EUA). This EUA will remain in effect (meaning this test can be used) for the duration of the COVID-19 declaration under Section 564(b)(1) of the Act, 21 U.S.C. section 360bbb-3(b)(1), unless the authorization is terminated or revoked.  Performed at [facility], [address] [place], [place] [address]     Procedures and diagnostic studies:  DG Chest [facility] 1 View  Result Date: 08/19/2021 CLINICAL DATA:  Cough EXAM: PORTABLE CHEST 1 VIEW COMPARISON:  AP chest 08/11/2021 FINDINGS: Status post  median sternotomy and CABG. Cardiac silhouette and mediastinal contours are within normal limits. The lungs are  clear. No pleural effusion or pneumothorax. No acute skeletal abnormality. IMPRESSION: No acute cardiopulmonary disease process. Electronically Signed   By: Yvonne Kendall M.D.   On: 08/19/2021 18:02               LOS: 10 days   Kaziah Krizek  [facility]   Pager on [email or web address]. If 7PM-7AM, please contact night-coverage at [email or web address]     08/21/2021, 12:33 PM

## 2021-08-21 NOTE — Discharge Summary (Addendum)
Physician Discharge Summary   Patient: Candace [name] MRN: [id number] DOB: Mar 15, 1951  Admit date:     08/11/2021  Discharge date: 08/21/21  Discharge Physician: Jennye Boroughs   PCP: [name]., MD   Recommendations at discharge:   Follow-up with PCP in 2 weeks  Discharge Diagnoses: Principal Problem:   COVID-19 Active Problems:   Chronic diastolic CHF (congestive heart failure) (HCC)   Paroxysmal atrial fibrillation (HCC)   Atrial fibrillation with RVR (HCC)   Essential hypertension   Orthostatic hypotension   Hypokalemia   Positive blood culture   Type 2 diabetes mellitus with diabetic polyneuropathy, with long-term current use of insulin (HCC)   Depression with anxiety   Atypical chest pain   Coronary artery disease   Generalized weakness   Obesity (BMI 30-39.9)   History of vertigo  Resolved Problems:   * No resolved hospital problems. [facility] Course: Candace [name] is a 71 y.o. female with medical history significant for coronary artery disease status post CABG, history of unstable angina, anxiety disorder, chronic diastolic CHF (no longer on Lasix), paroxysmal A-fib, hypertension, depression, anxiety, insulin-dependent diabetes mellitus.  She presented to the ER on 08/11/2021 with complaints of 3-day history progressive generalized weakness, myalgias, sore throat, headache and productive cough, chills, poor oral intake and diarrhea.  She reported testing positive for COVID-19 on a home test 08/10/2021.  Due to worsening weakness and inability to ambulate at home, EMS was called.she was found to be hypotensive and transported to the ER.   She was admitted to the hospital for COVID-19 infection.  She was treated with Paxlovid.  She was also given midodrine for hypotension.  She developed A-fib with RVR which improved with metoprolol.  Delene Loll was held because of hypotension.  She complained of cough and pleuritic chest pain.  CT of the chest did not show  any evidence of pulmonary embolism or pneumonia.  She was evaluated by PT and OT who recommended further rehabilitation at a skilled nursing facility.     Consultants: None Procedures performed: None Disposition: Skilled nursing facility Diet recommendation:  Discharge Diet Orders (From admission, onward)     Start     Ordered   08/21/21 0000  Diet - low sodium heart healthy        08/21/21 1222   08/21/21 0000  Diet Carb Modified        08/21/21 1222           Cardiac and Carb modified diet DISCHARGE MEDICATION: Allergies as of 08/21/2021       Reactions   Tramadol Other (See Comments)   Causes patient to be off balance and Mental Changes   Lisinopril Cough   Penicillins Swelling, Rash, Other (See Comments)   Did it involve swelling of the face/tongue/throat, SOB, or low BP? Yes Did it involve sudden or severe rash/hives, skin peeling, or any reaction on the inside of your mouth or nose? Yes Did you need to seek medical attention at a hospital or doctor's office? Yes When did it last happen?      15 years If all above answers are "NO", may proceed with cephalosporin use.        Medication List     STOP taking these medications    bismuth subsalicylate 219 MG chewable tablet Commonly known as: PEPTO BISMOL   furosemide 20 MG tablet Commonly known as: LASIX       TAKE these medications    acetaminophen  325 MG tablet Commonly known as: TYLENOL Take 650 mg by mouth every 6 (six) hours as needed for moderate pain or headache.   albuterol 108 (90 Base) MCG/ACT inhaler Commonly known as: VENTOLIN HFA Inhale 2 puffs into the lungs every 6 (six) hours as needed for wheezing or shortness of breath.   ALPRAZolam 0.25 MG tablet Commonly known as: XANAX Take 1 tablet (0.25 mg total) by mouth every 8 (eight) hours as needed for anxiety.   clopidogrel 75 MG tablet Commonly known as: PLAVIX Take 1 tablet (75 mg total) by mouth daily.   Eliquis 5 MG Tabs  tablet Generic drug: apixaban Take 5 mg by mouth 2 (two) times daily.   empagliflozin 25 MG Tabs tablet Commonly known as: Jardiance Take 1 tablet (25 mg total) by mouth daily.   ezetimibe 10 MG tablet Commonly known as: ZETIA Take 1 tablet (10 mg total) by mouth daily.   glucose blood test strip Use to check blood sugars daily as instructed   Insulin Pen Needle 32G X 4 MM Misc Use to inject insulin daily   isosorbide mononitrate 120 MG 24 hr tablet Commonly known as: IMDUR Take 1 tablet (120 mg total) by mouth daily.   Lantus SoloStar 100 UNIT/ML Solostar Pen Generic drug: insulin glargine Inject 12 Units into the skin at bedtime. What changed: how much to take   loperamide 2 MG capsule Commonly known as: IMODIUM Take 2 mg by mouth daily as needed for diarrhea or loose stools.   metoprolol tartrate 100 MG tablet Commonly known as: LOPRESSOR Take 100 mg by mouth 2 (two) times daily.   nitroGLYCERIN 0.4 MG SL tablet Commonly known as: NITROSTAT Place 1 tablet (0.4 mg total) under the tongue every 5 (five) minutes as needed for chest pain.   nystatin powder Commonly known as: nystatin Apply 1 application topically daily.   ondansetron 8 MG disintegrating tablet Commonly known as: ZOFRAN-ODT Take 1 tablet (8 mg total) by mouth every 8 (eight) hours as needed for refractory nausea / vomiting.   OneTouch Delica Plus [id number] Misc USE UP TO 4 TIMES DAILY AS DIRECTED   OneTouch Verio w/Device Kit Use daily to check blood sugar   pantoprazole 20 MG tablet Commonly known as: Protonix Take 1 tablet (20 mg total) by mouth 2 (two) times daily.   rosuvastatin 40 MG tablet Commonly known as: CRESTOR Take 1 tablet (40 mg total) by mouth daily.   sacubitril-valsartan 24-26 MG Commonly known as: ENTRESTO Take 1 tablet by mouth 2 (two) times daily.   sertraline 100 MG tablet Commonly known as: ZOLOFT Take 1 tablet (100 mg total) by mouth daily.   Trulicity 1.5  ML/4.6TK Sopn Generic drug: Dulaglutide Inject 1.5 mg into the skin once a week.        Contact information for after-discharge care     Destination     [facility] .   Service: Skilled Nursing Contact information: [address] [place] [phone]                    Discharge Exam: Danley Danker Weights   08/11/21 1238 08/17/21 0544 08/20/21 0500  Weight: 74.8 kg 81.5 kg 80.8 kg     The results of significant diagnostics from this hospitalization (including imaging, microbiology, ancillary and laboratory) are listed below for reference.   Imaging Studies: CT Angio Chest PE W and/or Wo Contrast  Result Date: 08/11/2021 CLINICAL DATA:  Positive D-dimer.  Concern for pulmonary embolism. EXAM: CT ANGIOGRAPHY CHEST WITH CONTRAST TECHNIQUE: Multidetector CT imaging of the chest was performed using the standard protocol during bolus administration of intravenous contrast. Multiplanar CT image reconstructions and MIPs were obtained to evaluate the vascular anatomy. RADIATION DOSE REDUCTION: This exam was performed according to the departmental dose-optimization program which includes automated exposure control, adjustment of the mA and/or kV according to patient size and/or use of iterative reconstruction technique. CONTRAST:  47m OMNIPAQUE IOHEXOL 350 MG/ML SOLN COMPARISON:  Chest radiograph dated 08/11/2021 and CT dated 02/09/2021. FINDINGS: Evaluation of this exam is limited due to respiratory motion artifact. Cardiovascular: There is no cardiomegaly or pericardial effusion. There is coronary vascular calcification and postsurgical changes of CABG. Mild atherosclerotic calcification of the thoracic aorta. No aneurysmal dilatation or dissection. Evaluation of the pulmonary arteries is very limited due to respiratory motion and suboptimal opacification and timing of the contrast. No large or central pulmonary artery embolus identified.  Mediastinum/Nodes: No hilar or mediastinal adenopathy. There is a small hiatal hernia. The esophagus and the thyroid gland are grossly unremarkable. No mediastinal fluid collection. Lungs/Pleura: Trace right pleural effusion versus pleural thickening. No focal consolidation, or pneumothorax. The central airways are patent. Upper Abdomen: No acute abnormality. Musculoskeletal: Degenerative changes of the spine and severe degenerative changes of the shoulders. No acute osseous pathology. Review of the MIP images confirms the above findings. IMPRESSION: 1. No large or central pulmonary artery embolus identified. 2. Mild pleural thickening versus less likely trace right pleural effusion. 3. Aortic Atherosclerosis (ICD10-I70.0). Electronically Signed   By: AAnner CreteM.D.   On: 08/11/2021 20:18   DG Chest Port 1 View  Result Date: 08/19/2021 CLINICAL DATA:  Cough EXAM: PORTABLE CHEST 1 VIEW COMPARISON:  AP chest 08/11/2021 FINDINGS: Status post median sternotomy and CABG. Cardiac silhouette and mediastinal contours are within normal limits. The lungs are clear. No pleural effusion or pneumothorax. No acute skeletal abnormality. IMPRESSION: No acute cardiopulmonary disease process. Electronically Signed   By: RYvonne KendallM.D.   On: 08/19/2021 18:02   DG Chest Portable 1 View  Result Date: 08/11/2021 CLINICAL DATA:  Chest pain, shortness of breath. EXAM: PORTABLE CHEST 1 VIEW COMPARISON:  July 15, 2021. FINDINGS: The heart size and mediastinal contours are within normal limits. Status post coronary bypass graft. Both lungs are clear. The visualized skeletal structures are unremarkable. IMPRESSION: No active disease. Electronically Signed   By: JMarijo ConceptionM.D.   On: 08/11/2021 13:05    Microbiology: Results for orders placed or performed during the hospital encounter of 08/11/21  Culture, blood (routine x 2)     Status: Abnormal   Collection Time: 08/11/21 12:59 PM   Specimen: BLOOD  Result  Value Ref Range Status   Specimen Description   Final    BLOOD RIGHT HAND [facility] [address] [place] [address]   Special Requests   Final    BOTTLES DRAWN AEROBIC AND ANAEROBIC Blood Culture adequate volume [facility] [address], [place] [address]   Culture  Setup Time   Final    Organism ID to follow ANAEROBIC BOTTLE ONLY GRAM POSITIVE COCCI CRITICAL RESULT CALLED TO, READ BACK BY AND VERIFIED WITH: NATHAN BELUTH @ 08295ON 06/13/2021..Marland KitchenMarland KitchenKR GRAM STAIN REVIEWED-AGREE WITH RESULT    Culture (A)  Final    STAPHYLOCOCCUS HOMINIS THE SIGNIFICANCE OF ISOLATING THIS ORGANISM FROM A SINGLE SET OF BLOOD CULTURES WHEN MULTIPLE  SETS ARE DRAWN IS UNCERTAIN. PLEASE NOTIFY THE MICROBIOLOGY DEPARTMENT WITHIN ONE WEEK IF SPECIATION AND SENSITIVITIES ARE REQUIRED. Performed at [facility], [place] [address] [place], [place] [address]    Report Status 08/15/2021 FINAL  Final  Blood Culture ID Panel (Reflexed)     Status: Abnormal   Collection Time: 08/11/21 12:59 PM  Result Value Ref Range Status   Enterococcus faecalis NOT DETECTED NOT DETECTED Final   Enterococcus Faecium NOT DETECTED NOT DETECTED Final   Listeria monocytogenes NOT DETECTED NOT DETECTED Final   Staphylococcus species DETECTED (A) NOT DETECTED Final    Comment: CRITICAL RESULT CALLED TO, READ BACK BY AND VERIFIED WITH: NATHAN BELUTH @ 1224 ON 06/13/2021.Marland KitchenMarland KitchenTKR    Staphylococcus aureus (BCID) NOT DETECTED NOT DETECTED Final   Staphylococcus epidermidis NOT DETECTED NOT DETECTED Final   Staphylococcus lugdunensis NOT DETECTED NOT DETECTED Final   Streptococcus species NOT DETECTED NOT DETECTED Final   Streptococcus agalactiae NOT DETECTED NOT DETECTED Final   Streptococcus pneumoniae NOT DETECTED NOT DETECTED Final   Streptococcus pyogenes NOT DETECTED NOT DETECTED Final   A.calcoaceticus-baumannii NOT DETECTED NOT DETECTED Final   Bacteroides  fragilis NOT DETECTED NOT DETECTED Final   Enterobacterales NOT DETECTED NOT DETECTED Final   Enterobacter cloacae complex NOT DETECTED NOT DETECTED Final   Escherichia coli NOT DETECTED NOT DETECTED Final   Klebsiella aerogenes NOT DETECTED NOT DETECTED Final   Klebsiella oxytoca NOT DETECTED NOT DETECTED Final   Klebsiella pneumoniae NOT DETECTED NOT DETECTED Final   Proteus species NOT DETECTED NOT DETECTED Final   Salmonella species NOT DETECTED NOT DETECTED Final   Serratia marcescens NOT DETECTED NOT DETECTED Final   Haemophilus influenzae NOT DETECTED NOT DETECTED Final   Neisseria meningitidis NOT DETECTED NOT DETECTED Final   Pseudomonas aeruginosa NOT DETECTED NOT DETECTED Final   Stenotrophomonas maltophilia NOT DETECTED NOT DETECTED Final   Candida albicans NOT DETECTED NOT DETECTED Final   Candida auris NOT DETECTED NOT DETECTED Final   Candida glabrata NOT DETECTED NOT DETECTED Final   Candida krusei NOT DETECTED NOT DETECTED Final   Candida parapsilosis NOT DETECTED NOT DETECTED Final   Candida tropicalis NOT DETECTED NOT DETECTED Final   Cryptococcus neoformans/gattii NOT DETECTED NOT DETECTED Final    Comment: Performed at [facility], [address] [place], [place] [address]  Culture, blood (routine x 2)     Status: None   Collection Time: 08/11/21  1:01 PM   Specimen: BLOOD  Result Value Ref Range Status   Specimen Description BLOOD RIGHT ARM  Final   Special Requests   Final    BOTTLES DRAWN AEROBIC AND ANAEROBIC Blood Culture adequate volume   Culture   Final    NO GROWTH 5 DAYS Performed at [facility], [address] [place], [place] [address]    Report Status 08/16/2021 FINAL  Final  Resp Panel by RT-PCR (Flu A&B, Covid) Nasopharyngeal Swab     Status: Abnormal   Collection Time: 08/11/21  1:57 PM   Specimen: Nasopharyngeal Swab; Nasopharyngeal(NP) swabs in vial transport medium  Result Value Ref Range Status   SARS Coronavirus 2  by RT PCR POSITIVE (A) NEGATIVE Final    Comment: (NOTE) SARS-CoV-2 target nucleic acids are DETECTED.  The SARS-CoV-2 RNA is generally detectable in upper respiratory specimens during the acute phase of infection. Positive results are indicative of the presence of the identified virus, but do not rule out bacterial infection or co-infection with other pathogens not detected  by the test. Clinical correlation with patient history and other diagnostic information is necessary to determine patient infection status. The expected result is Negative.  Fact Sheet for Patients: EntrepreneurPulse.com.au  Fact Sheet for Healthcare Providers: IncredibleEmployment.be  This test is not yet approved or cleared by the [place] FDA and  has been authorized for detection and/or diagnosis of SARS-CoV-2 by FDA under an Emergency Use Authorization (EUA).  This EUA will remain in effect (meaning this test can be used) for the duration of  the COVID-19 declaration under Section 564(b)(1) of the A ct, 21 U.S.C. section 360bbb-3(b)(1), unless the authorization is terminated or revoked sooner.     Influenza A by PCR NEGATIVE NEGATIVE Final   Influenza B by PCR NEGATIVE NEGATIVE Final    Comment: (NOTE) The Xpert Xpress SARS-CoV-2/FLU/RSV plus assay is intended as an aid in the diagnosis of influenza from Nasopharyngeal swab specimens and should not be used as a sole basis for treatment. Nasal washings and aspirates are unacceptable for Xpert Xpress SARS-CoV-2/FLU/RSV testing.  Fact Sheet for Patients: EntrepreneurPulse.com.au  Fact Sheet for Healthcare Providers: IncredibleEmployment.be  This test is not yet approved or cleared by the [place] FDA and has been authorized for detection and/or diagnosis of SARS-CoV-2 by FDA under an Emergency Use Authorization (EUA). This EUA will remain in effect (meaning this test can  be used) for the duration of the COVID-19 declaration under Section 564(b)(1) of the Act, 21 U.S.C. section 360bbb-3(b)(1), unless the authorization is terminated or revoked.  Performed at [facility], [address] [place], [place] [address]     Labs: CBC: No results for input(s): WBC, NEUTROABS, HGB, HCT, MCV, PLT in the last 168 hours. Basic Metabolic Panel: Recent Labs  Lab 08/15/21 0454 08/16/21 0616 08/18/21 0435  NA 142 140 139  K 4.3 4.0 3.9  CL 106 103 104  CO2 '27 28 27  ' GLUCOSE 86 99 160*  BUN '12 19 17  ' CREATININE 0.56 0.66 0.59  CALCIUM 8.9 9.0 9.2  MG 2.0 2.0  --    Liver Function Tests: Recent Labs  Lab 08/15/21 0454 08/16/21 0616 08/18/21 0435  AST 29 61* 38  ALT 17 39 39  ALKPHOS 74 85 99  BILITOT 0.5 0.6 0.4  PROT 6.4* 6.6 6.3*  ALBUMIN 3.1* 3.3* 3.2*   CBG: Recent Labs  Lab 08/20/21 1205 08/20/21 1548 08/20/21 2037 08/20/21 2058 08/21/21 0823  GLUCAP 220* 164* 192* 176* 143*    Signed: Jennye Boroughs, MD [facility] 08/21/2021

## 2021-08-21 NOTE — Care Management Important Message (Signed)
Important Message  Patient Details  Name: Candace [name] MRN: [id number] Date of Birth: [birth year]   Medicare Important Message Given:  Yes     Dannette Barbara 08/21/2021, 11:35 AM

## 2021-08-22 LAB — GLUCOSE, CAPILLARY
Glucose-Capillary: 143 mg/dL — ABNORMAL HIGH (ref 70–99)
Glucose-Capillary: 196 mg/dL — ABNORMAL HIGH (ref 70–99)

## 2021-08-22 NOTE — Discharge Summary (Addendum)
Physician Discharge Summary   Patient: Candace [name] MRN: [id number] DOB: 1950/12/21  Admit date:     08/11/2021  Discharge date: 08/22/21  Discharge Physician: Jennye Boroughs   PCP: [name]., MD   Recommendations at discharge:   Follow-up with physician at the nursing home within 3 days of discharge.  Follow-up with PCP in 2 weeks  Discharge Diagnoses: Principal Problem:   COVID-19 Active Problems:   Chronic diastolic CHF (congestive heart failure) (HCC)   Paroxysmal atrial fibrillation (HCC)   Atrial fibrillation with RVR (HCC)   Essential hypertension   Orthostatic hypotension   Hypokalemia   Positive blood culture   Type 2 diabetes mellitus with diabetic polyneuropathy, with long-term current use of insulin (HCC)   Depression with anxiety   Atypical chest pain   Coronary artery disease   Generalized weakness   Obesity (BMI 30-39.9)   History of vertigo  Resolved Problems:   * No resolved hospital problems. [facility] Course:  [name] is a 71 y.o. female with medical history significant for coronary artery disease status post CABG, history of unstable angina, anxiety disorder, chronic diastolic CHF (no longer on Lasix), paroxysmal A-fib, hypertension, depression, anxiety, insulin-dependent diabetes mellitus.  She presented to the ER on 08/11/2021 with complaints of 3-day history progressive generalized weakness, myalgias, sore throat, headache and productive cough, chills, poor oral intake and diarrhea.  She reported testing positive for COVID-19 on a home test 08/10/2021.  Due to worsening weakness and inability to ambulate at home, EMS was called.she was found to be hypotensive and transported to the ER.   She was admitted to the hospital for COVID-19 infection.  She was treated with Paxlovid.  She was also given midodrine for hypotension.  She developed A-fib with RVR which improved with metoprolol.  Delene Loll was held because of hypotension.   She complained of cough and pleuritic chest pain.  CT of the chest did not show any evidence of pulmonary embolism or pneumonia.   She was evaluated by PT and OT who recommended further rehabilitation at a skilled nursing facility.  She is deemed stable for discharge to SNF today.  Discharge plan was discussed with the patient and she verbalized understanding and agrees with the plan.       Consultants: None Procedures performed: None Disposition: Skilled nursing facility Diet recommendation:  Discharge Diet Orders (From admission, onward)     Start     Ordered   08/21/21 0000  Diet - low sodium heart healthy        08/21/21 1222   08/21/21 0000  Diet Carb Modified        08/21/21 1222           Cardiac and Carb modified diet DISCHARGE MEDICATION: Allergies as of 08/22/2021       Reactions   Tramadol Other (See Comments)   Causes patient to be off balance and Mental Changes   Lisinopril Cough   Penicillins Swelling, Rash, Other (See Comments)   Did it involve swelling of the face/tongue/throat, SOB, or low BP? Yes Did it involve sudden or severe rash/hives, skin peeling, or any reaction on the inside of your mouth or nose? Yes Did you need to seek medical attention at a hospital or doctor's office? Yes When did it last happen?      15 years If all above answers are "NO", may proceed with cephalosporin use.        Medication List  STOP taking these medications    bismuth subsalicylate 626 MG chewable tablet Commonly known as: PEPTO BISMOL   furosemide 20 MG tablet Commonly known as: LASIX       TAKE these medications    acetaminophen 325 MG tablet Commonly known as: TYLENOL Take 650 mg by mouth every 6 (six) hours as needed for moderate pain or headache.   albuterol 108 (90 Base) MCG/ACT inhaler Commonly known as: VENTOLIN HFA Inhale 2 puffs into the lungs every 6 (six) hours as needed for wheezing or shortness of breath.   ALPRAZolam 0.25 MG  tablet Commonly known as: XANAX Take 1 tablet (0.25 mg total) by mouth every 8 (eight) hours as needed for anxiety.   clopidogrel 75 MG tablet Commonly known as: PLAVIX Take 1 tablet (75 mg total) by mouth daily.   Eliquis 5 MG Tabs tablet Generic drug: apixaban Take 5 mg by mouth 2 (two) times daily.   empagliflozin 25 MG Tabs tablet Commonly known as: Jardiance Take 1 tablet (25 mg total) by mouth daily.   ezetimibe 10 MG tablet Commonly known as: ZETIA Take 1 tablet (10 mg total) by mouth daily.   glucose blood test strip Use to check blood sugars daily as instructed   Insulin Pen Needle 32G X 4 MM Misc Use to inject insulin daily   isosorbide mononitrate 120 MG 24 hr tablet Commonly known as: IMDUR Take 1 tablet (120 mg total) by mouth daily.   Lantus SoloStar 100 UNIT/ML Solostar Pen Generic drug: insulin glargine Inject 12 Units into the skin at bedtime. What changed: how much to take   loperamide 2 MG capsule Commonly known as: IMODIUM Take 2 mg by mouth daily as needed for diarrhea or loose stools.   metoprolol tartrate 100 MG tablet Commonly known as: LOPRESSOR Take 100 mg by mouth 2 (two) times daily.   nitroGLYCERIN 0.4 MG SL tablet Commonly known as: NITROSTAT Place 1 tablet (0.4 mg total) under the tongue every 5 (five) minutes as needed for chest pain.   nystatin powder Commonly known as: nystatin Apply 1 application topically daily.   ondansetron 8 MG disintegrating tablet Commonly known as: ZOFRAN-ODT Take 1 tablet (8 mg total) by mouth every 8 (eight) hours as needed for refractory nausea / vomiting.   OneTouch Delica Plus [id number] Misc USE UP TO 4 TIMES DAILY AS DIRECTED   OneTouch Verio w/Device Kit Use daily to check blood sugar   pantoprazole 20 MG tablet Commonly known as: Protonix Take 1 tablet (20 mg total) by mouth 2 (two) times daily.   rosuvastatin 40 MG tablet Commonly known as: CRESTOR Take 1 tablet (40 mg total) by  mouth daily.   sacubitril-valsartan 24-26 MG Commonly known as: ENTRESTO Take 1 tablet by mouth 2 (two) times daily.   sertraline 100 MG tablet Commonly known as: ZOLOFT Take 1 tablet (100 mg total) by mouth daily.   Trulicity 1.5 JJ/0.0XF Sopn Generic drug: Dulaglutide Inject 1.5 mg into the skin once a week.        Contact information for after-discharge care     Destination     [facility]erred [facility]F .   Service: Skilled Nursing Contact information: [address] [place] [phone]                    Discharge Exam: Filed Weights   08/17/21 0544 08/20/21 0500 08/22/21 0500  Weight: 81.5 kg 80.8 kg 82 kg   GEN: NAD  SKIN: Warm and dry EYES: No pallor or icterus ENT: MMM CV: RRR PULM: CTA B ABD: soft, obese, NT, +BS CNS: AAO x 3, non focal EXT: No edema or tenderness   Condition at discharge: good  The results of significant diagnostics from this hospitalization (including imaging, microbiology, ancillary and laboratory) are listed below for reference.   Imaging Studies: CT Angio Chest PE W and/or Wo Contrast  Result Date: 08/11/2021 CLINICAL DATA:  Positive D-dimer.  Concern for pulmonary embolism. EXAM: CT ANGIOGRAPHY CHEST WITH CONTRAST TECHNIQUE: Multidetector CT imaging of the chest was performed using the standard protocol during bolus administration of intravenous contrast. Multiplanar CT image reconstructions and MIPs were obtained to evaluate the vascular anatomy. RADIATION DOSE REDUCTION: This exam was performed according to the departmental dose-optimization program which includes automated exposure control, adjustment of the mA and/or kV according to patient size and/or use of iterative reconstruction technique. CONTRAST:  11m OMNIPAQUE IOHEXOL 350 MG/ML SOLN COMPARISON:  Chest radiograph dated 08/11/2021 and CT dated 02/09/2021. FINDINGS: Evaluation of this exam is limited due to respiratory motion  artifact. Cardiovascular: There is no cardiomegaly or pericardial effusion. There is coronary vascular calcification and postsurgical changes of CABG. Mild atherosclerotic calcification of the thoracic aorta. No aneurysmal dilatation or dissection. Evaluation of the pulmonary arteries is very limited due to respiratory motion and suboptimal opacification and timing of the contrast. No large or central pulmonary artery embolus identified. Mediastinum/Nodes: No hilar or mediastinal adenopathy. There is a small hiatal hernia. The esophagus and the thyroid gland are grossly unremarkable. No mediastinal fluid collection. Lungs/Pleura: Trace right pleural effusion versus pleural thickening. No focal consolidation, or pneumothorax. The central airways are patent. Upper Abdomen: No acute abnormality. Musculoskeletal: Degenerative changes of the spine and severe degenerative changes of the shoulders. No acute osseous pathology. Review of the MIP images confirms the above findings. IMPRESSION: 1. No large or central pulmonary artery embolus identified. 2. Mild pleural thickening versus less likely trace right pleural effusion. 3. Aortic Atherosclerosis (ICD10-I70.0). Electronically Signed   By: AAnner CreteM.D.   On: 08/11/2021 20:18   DG Chest Port 1 View  Result Date: 08/19/2021 CLINICAL DATA:  Cough EXAM: PORTABLE CHEST 1 VIEW COMPARISON:  AP chest 08/11/2021 FINDINGS: Status post median sternotomy and CABG. Cardiac silhouette and mediastinal contours are within normal limits. The lungs are clear. No pleural effusion or pneumothorax. No acute skeletal abnormality. IMPRESSION: No acute cardiopulmonary disease process. Electronically Signed   By: RYvonne KendallM.D.   On: 08/19/2021 18:02   DG Chest Portable 1 View  Result Date: 08/11/2021 CLINICAL DATA:  Chest pain, shortness of breath. EXAM: PORTABLE CHEST 1 VIEW COMPARISON:  July 15, 2021. FINDINGS: The heart size and mediastinal contours are within normal  limits. Status post coronary bypass graft. Both lungs are clear. The visualized skeletal structures are unremarkable. IMPRESSION: No active disease. Electronically Signed   By: JMarijo ConceptionM.D.   On: 08/11/2021 13:05    Microbiology: Results for orders placed or performed during the hospital encounter of 08/11/21  Culture, blood (routine x 2)     Status: Abnormal   Collection Time: 08/11/21 12:59 PM   Specimen: BLOOD  Result Value Ref Range Status   Specimen Description   Final    BLOOD RIGHT HAND Performed at [facility] [address] [place]  [address]   Special Requests   Final    BOTTLES DRAWN AEROBIC AND ANAEROBIC Blood Culture adequate volume [facility], [address] [place], [place] [address]    Culture  Setup Time   Final    Organism ID to follow ANAEROBIC BOTTLE ONLY GRAM POSITIVE COCCI CRITICAL RESULT CALLED TO, READ BACK BY AND VERIFIED WITH: NATHAN BELUTH @ 2947 ON 06/13/2021.Marland KitchenMarland KitchenTKR GRAM STAIN REVIEWED-AGREE WITH RESULT    Culture (A)  Final    STAPHYLOCOCCUS HOMINIS THE SIGNIFICANCE OF ISOLATING THIS ORGANISM FROM A SINGLE SET OF BLOOD CULTURES WHEN MULTIPLE SETS ARE DRAWN IS UNCERTAIN. PLEASE NOTIFY THE MICROBIOLOGY DEPARTMENT WITHIN ONE WEEK IF SPECIATION AND SENSITIVITIES ARE REQUIRED. Performed at [facility], [place] [address], [place], [place] [address]    Report Status 08/15/2021 FINAL  Final  Blood Culture ID Panel (Reflexed)     Status: Abnormal   Collection Time: 08/11/21 12:59 PM  Result Value Ref Range Status   Enterococcus faecalis NOT DETECTED NOT DETECTED Final   Enterococcus Faecium NOT DETECTED NOT DETECTED Final   Listeria monocytogenes NOT DETECTED NOT DETECTED Final   Staphylococcus species DETECTED (A) NOT DETECTED Final    Comment: CRITICAL RESULT CALLED TO, READ BACK BY AND VERIFIED WITH: NATHAN BELUTH @ 0354 ON 06/13/2021.Marland KitchenMarland KitchenTKR    Staphylococcus aureus (BCID) NOT DETECTED NOT DETECTED  Final   Staphylococcus epidermidis NOT DETECTED NOT DETECTED Final   Staphylococcus lugdunensis NOT DETECTED NOT DETECTED Final   Streptococcus species NOT DETECTED NOT DETECTED Final   Streptococcus agalactiae NOT DETECTED NOT DETECTED Final   Streptococcus pneumoniae NOT DETECTED NOT DETECTED Final   Streptococcus pyogenes NOT DETECTED NOT DETECTED Final   A.calcoaceticus-baumannii NOT DETECTED NOT DETECTED Final   Bacteroides fragilis NOT DETECTED NOT DETECTED Final   Enterobacterales NOT DETECTED NOT DETECTED Final   Enterobacter cloacae complex NOT DETECTED NOT DETECTED Final   Escherichia coli NOT DETECTED NOT DETECTED Final   Klebsiella aerogenes NOT DETECTED NOT DETECTED Final   Klebsiella oxytoca NOT DETECTED NOT DETECTED Final   Klebsiella pneumoniae NOT DETECTED NOT DETECTED Final   Proteus species NOT DETECTED NOT DETECTED Final   Salmonella species NOT DETECTED NOT DETECTED Final   Serratia marcescens NOT DETECTED NOT DETECTED Final   Haemophilus influenzae NOT DETECTED NOT DETECTED Final   Neisseria meningitidis NOT DETECTED NOT DETECTED Final   Pseudomonas aeruginosa NOT DETECTED NOT DETECTED Final   Stenotrophomonas maltophilia NOT DETECTED NOT DETECTED Final   Candida albicans NOT DETECTED NOT DETECTED Final   Candida auris NOT DETECTED NOT DETECTED Final   Candida glabrata NOT DETECTED NOT DETECTED Final   Candida krusei NOT DETECTED NOT DETECTED Final   Candida parapsilosis NOT DETECTED NOT DETECTED Final   Candida tropicalis NOT DETECTED NOT DETECTED Final   Cryptococcus neoformans/gattii NOT DETECTED NOT DETECTED Final    Comment: Performed at [facility], [address] [place], [place] [address]  Culture, blood (routine x 2)     Status: None   Collection Time: 08/11/21  1:01 PM   Specimen: BLOOD  Result Value Ref Range Status   Specimen Description BLOOD RIGHT ARM  Final   Special Requests   Final    BOTTLES DRAWN AEROBIC AND ANAEROBIC Blood  Culture adequate volume   Culture   Final    NO GROWTH 5 DAYS Performed at [facility], [place] [place],  [address]    Report Status 08/16/2021 FINAL  Final  Resp Panel by RT-PCR (Flu A&B, Covid) Nasopharyngeal Swab     Status: Abnormal   Collection Time: 08/11/21  1:57 PM  Specimen: Nasopharyngeal Swab; Nasopharyngeal(NP) swabs in vial transport medium  Result Value Ref Range Status   SARS Coronavirus 2 by RT PCR POSITIVE (A) NEGATIVE Final    Comment: (NOTE) SARS-CoV-2 target nucleic acids are DETECTED.  The SARS-CoV-2 RNA is generally detectable in upper respiratory specimens during the acute phase of infection. Positive results are indicative of the presence of the identified virus, but do not rule out bacterial infection or co-infection with other pathogens not detected by the test. Clinical correlation with patient history and other diagnostic information is necessary to determine patient infection status. The expected result is Negative.  Fact Sheet for Patients: EntrepreneurPulse.com.au  Fact Sheet for Healthcare Providers: IncredibleEmployment.be  This test is not yet approved or cleared by the [place] FDA and  has been authorized for detection and/or diagnosis of SARS-CoV-2 by FDA under an Emergency Use Authorization (EUA).  This EUA will remain in effect (meaning this test can be used) for the duration of  the COVID-19 declaration under Section 564(b)(1) of the A ct, 21 U.S.C. section 360bbb-3(b)(1), unless the authorization is terminated or revoked sooner.     Influenza A by PCR NEGATIVE NEGATIVE Final   Influenza B by PCR NEGATIVE NEGATIVE Final    Comment: (NOTE) The Xpert Xpress SARS-CoV-2/FLU/RSV plus assay is intended as an aid in the diagnosis of influenza from Nasopharyngeal swab specimens and should not be used as a sole basis for treatment. Nasal washings and aspirates are unacceptable  for Xpert Xpress SARS-CoV-2/FLU/RSV testing.  Fact Sheet for Patients: EntrepreneurPulse.com.au  Fact Sheet for Healthcare Providers: IncredibleEmployment.be  This test is not yet approved or cleared by the [place] FDA and has been authorized for detection and/or diagnosis of SARS-CoV-2 by FDA under an Emergency Use Authorization (EUA). This EUA will remain in effect (meaning this test can be used) for the duration of the COVID-19 declaration under Section 564(b)(1) of the Act, 21 U.S.C. section 360bbb-3(b)(1), unless the authorization is terminated or revoked.  Performed at [facility], [address] [place], [place] [address]     Labs: CBC: No results for input(s): WBC, NEUTROABS, HGB, HCT, MCV, PLT in the last 168 hours. Basic Metabolic Panel: Recent Labs  Lab 08/16/21 0616 08/18/21 0435  NA 140 139  K 4.0 3.9  CL 103 104  CO2 28 27  GLUCOSE 99 160*  BUN 19 17  CREATININE 0.66 0.59  CALCIUM 9.0 9.2  MG 2.0  --    Liver Function Tests: Recent Labs  Lab 08/16/21 0616 08/18/21 0435  AST 61* 38  ALT 39 39  ALKPHOS 85 99  BILITOT 0.6 0.4  PROT 6.6 6.3*  ALBUMIN 3.3* 3.2*   CBG: Recent Labs  Lab 08/21/21 0823 08/21/21 1232 08/21/21 1556 08/21/21 1941 08/22/21 0805  GLUCAP 143* 144* 210* 179* 143*    Discharge time spent:  greater than 30 minutes.  Signed: Jennye Boroughs, MD [facility] 08/22/2021

## 2021-08-22 NOTE — Progress Notes (Signed)
TOC gave additional telephone number for Isaias Cowman [phone] to call report to; pt will be going to Room 301B; attempted to call number and phone rang and rang with no voicemail attached to telephone number, then operator picked the line up and took this writer's name and phone number of [phone] in order to give report

## 2021-08-22 NOTE — Plan of Care (Signed)

## 2021-08-22 NOTE — Progress Notes (Signed)
EMS present for pt discharge to [facility]; discharge packet given to EMS personnel to take to [facility]; advised EMS personnel that I have been unable to reach anyone at [facility] to give them report; when they arrive if they are questioned about no report, please advise them to call the floor at [facility] and I will be glad to give report; pt discharged via stretcher

## 2021-08-22 NOTE — TOC Transition Note (Signed)
Transition of Care [facility]) - CM/SW Discharge Note   Patient Details  Name: Candace [name] MRN: [id number] Date of Birth: 08/09/1950  Transition [facility]) CM/SW Contact:  Magnus Ivan, LCSW Phone Number: 08/22/2021, 1:53 PM   Clinical Narrative:   Discharge to [facility] today. Room 301B. Confirmed with Admissions Worker [place]. *Asked Sharyn Lull to call patient's daughter per daughter's request, also informed Sharyn Lull that bedside RN has tried to call report multiple times and unable to get through, informed [place] staff can call 1C for report when available.  Updated MD, RN, and patient's daughter [name].  EMS paperwork completed. EMS arranged, patient is 2nd on list as of 1:54 pm. RN notified.    Final next level of care: Skilled Nursing Facility Barriers to Discharge: Barriers Resolved   Patient Goals and CMS Choice Patient states their goals for this hospitalization and ongoing recovery are:: SNF CMS Medicare.gov Compare Post Acute Care list provided to:: Patient Represenative (must comment) Choice offered to / list presented to : Adult Children  Discharge Placement              Patient [facility] bed at: [facility] Patient to be transferred to facility by: [facility] Name of family member notified: [name] Patient and family notified of of transfer: 08/22/21  Discharge Plan and Services     Post Acute Care Choice: Home Health                               Social Determinants of Health (SDOH) Interventions     Readmission Risk Interventions     View : No data to display.

## 2021-08-22 NOTE — TOC Progression Note (Signed)
Transition of Care [facility]) - Progression Note    Patient Details  Name: Candace [name] MRN: [id number] Date of Birth: 1951-02-25  Transition of Care [facility]) CM/SW [place], LCSW Phone Number: 08/22/2021, 10:59 AM  Clinical Narrative:   Call to Sharyn Lull at [facility] to get room # and # for report. Awaiting response.     Expected Discharge Plan: [place] Barriers to Discharge: Continued Medical Work up  Expected Discharge Plan and Services Expected Discharge Plan: [place] Choice: [place] arrangements for the past 2 months: Single Family Home Expected Discharge Date: 08/22/21                                     Social Determinants of Health (SDOH) Interventions    Readmission Risk Interventions     View : No data to display.

## 2021-08-22 NOTE — Progress Notes (Signed)
Follow up telephone call to [facility] [phone]; ? If there was an Therapist, sports working today that could take the new admission; the operator told me she gave them the message; she transferred me to another extension that just kept ringing with no voicemail; hung the phone up

## 2021-08-22 NOTE — Progress Notes (Signed)
MD order received in [facility] to discharge pt to SNF; TOC previously established discharge to [facility]; attempted to call report to Sharyn Lull at [facility] [phone]; left voicemail message asking her to call me at [phone]

## 2021-09-02 ENCOUNTER — Telehealth: Payer: Self-pay

## 2021-09-02 NOTE — Progress Notes (Signed)
Chronic Care Management  APPOINTMENT REMINDER   Called LETITA PRENTISS, No answer, left message of appointment on 09/07/2021 at 8:30 am via telephone visit with Junius Argyle , Pharm D. Notified to have all medications, supplements, blood pressure and/or blood sugar logs available during appointment and to return call if need to reschedule.   [place] Pharmacist Assistant [phone]

## 2021-09-04 ENCOUNTER — Telehealth: Payer: 59

## 2021-09-07 ENCOUNTER — Telehealth: Payer: 59

## 2021-09-07 ENCOUNTER — Encounter: Payer: Self-pay | Admitting: Family Medicine

## 2021-09-07 ENCOUNTER — Ambulatory Visit (INDEPENDENT_AMBULATORY_CARE_PROVIDER_SITE_OTHER): Payer: 59 | Admitting: Family Medicine

## 2021-09-07 VITALS — BP 102/60 | HR 79 | Resp 18

## 2021-09-07 DIAGNOSIS — U071 COVID-19: Secondary | ICD-10-CM

## 2021-09-07 DIAGNOSIS — Z794 Long term (current) use of insulin: Secondary | ICD-10-CM

## 2021-09-07 DIAGNOSIS — R2681 Unsteadiness on feet: Secondary | ICD-10-CM

## 2021-09-07 DIAGNOSIS — Z951 Presence of aortocoronary bypass graft: Secondary | ICD-10-CM

## 2021-09-07 DIAGNOSIS — I10 Essential (primary) hypertension: Secondary | ICD-10-CM

## 2021-09-07 DIAGNOSIS — I5032 Chronic diastolic (congestive) heart failure: Secondary | ICD-10-CM

## 2021-09-07 DIAGNOSIS — I25110 Atherosclerotic heart disease of native coronary artery with unstable angina pectoris: Secondary | ICD-10-CM | POA: Diagnosis not present

## 2021-09-07 DIAGNOSIS — E1142 Type 2 diabetes mellitus with diabetic polyneuropathy: Secondary | ICD-10-CM | POA: Diagnosis not present

## 2021-09-07 DIAGNOSIS — R5383 Other fatigue: Secondary | ICD-10-CM

## 2021-09-07 DIAGNOSIS — I480 Paroxysmal atrial fibrillation: Secondary | ICD-10-CM

## 2021-09-07 NOTE — Progress Notes (Deleted)
Chronic Care Management Pharmacy Note  09/07/2021 Name:  Candace [name] MRN:  [id number] DOB:  Sep 12, 1950  Summary: Patient presents for CCM follow-up.  -Higher dose of Imdur has not made much difference. Chest pain improved somewhat. Went shopping for the first time this weekend, significant shortness of breath.   Recommendations/Changes made from today's visit: -Continue current medications  -Recheck A1c, fasting lipid panel at PCP follow-up.  -Given her low GAD-7 score, would not recommend increasing sertraline at this time. Recommend work-up by PCP to evaluate patient for tremor and chest pain.   Plan: CPP Follow-up in 1 month   Subjective: Candace [name] is an 71 y.o. year old female who is a primary patient of [name]., MD.  The CCM team was consulted for assistance with disease management and care coordination needs.    Engaged with patient by telephone for follow up visit in response to provider referral for pharmacy case management and/or care coordination services.   Consent to Services:  The patient was given information about Chronic Care Management services, agreed to services, and gave verbal consent prior to initiation of services.  Please see initial visit note for detailed documentation.   Patient Care Team: [name]., MD as PCP - General (Family Medicine) Corey Skains, MD as Consulting Physician (Cardiology) Sharlotte Alamo, DPM (Podiatry) Neldon Labella, RN as Case Manager Virgel Manifold, MD (Inactive) as Consulting Physician (Gastroenterology) [facility], [place], Glean Salvo, [facility] as Pharmacist (Pharmacist)  Recent office visits: 06/30/21: Patient presented to Mikey Kirschner, PA-C for hip pain. Tramadol.  01/07/21: Patient presented to Dr. Rosanna Randy for ear pain and cough. Albuterol, doxycycline 100 mg twice daily.  12/02/20: Patient presented to Dr. Rosanna Randy for follow-up. Sertraline 100 mg.   Recent consult  visits: 07/15/21: Patient presented to Darylene Price, [place] for follow-up.  03/12/21: Patient presented to Dr. Nehemiah Massed (Cardiology) for follow-up. No medication changes noted.   Hospital visits: 07/15/21-07/17/21: Patient hospitalized for chest pain.   Objective:  Lab Results  Component Value Date   CREATININE 0.59 08/18/2021   BUN 17 08/18/2021   GFRNONAA >60 08/18/2021   GFRAA 108 02/06/2020   NA 139 08/18/2021   K 3.9 08/18/2021   CALCIUM 9.2 08/18/2021   CO2 27 08/18/2021    Lab Results  Component Value Date/Time   HGBA1C 8.2 (H) 07/30/2021 02:45 PM   HGBA1C 8.5 (H) 03/26/2021 09:07 AM   HGBA1C 9.2 07/10/2019 12:00 AM   HGBA1C 8.3 (H) 07/14/2013 05:42 AM   MICROALBUR 50 08/11/2020 08:51 AM    Last diabetic Eye exam:  Lab Results  Component Value Date/Time   HMDIABEYEEXA No Retinopathy 06/28/2019 12:00 AM    Last diabetic Foot exam: No results found for: "HMDIABFOOTEX"   Lab Results  Component Value Date   CHOL 164 08/11/2020   HDL 57 08/11/2020   LDLCALC 90 08/11/2020   TRIG 94 08/11/2020   CHOLHDL 2.9 08/11/2020       Latest Ref Rng & Units 08/18/2021    4:35 AM 08/16/2021    6:16 AM 08/15/2021    4:54 AM  Hepatic Function  Total Protein 6.5 - 8.1 g/dL 6.3  6.6  6.4   Albumin 3.5 - 5.0 g/dL 3.2  3.3  3.1   AST 15 - 41 U/L 38  61  29   ALT 0 - 44 U/L 39  39  17   Alk Phosphatase 38 - 126 U/L 99  85  74   Total Bilirubin 0.3 - 1.2 mg/dL 0.4  0.6  0.5     Lab Results  Component Value Date/Time   TSH 1.320 06/10/2021 04:03 PM   TSH 1.180 01/03/2019 09:57 AM       Latest Ref Rng & Units 08/14/2021    4:41 AM 08/13/2021    5:18 AM 08/12/2021    3:09 AM  CBC  WBC 4.0 - 10.5 K/uL 4.7  4.6  5.5   Hemoglobin 12.0 - 15.0 g/dL 12.1  11.7  12.1   Hematocrit 36.0 - 46.0 % 38.7  37.1  38.3   Platelets 150 - 400 K/uL 151  143  142     Lab Results  Component Value Date/Time   VD25OH 12.2 (L) 10/01/2016 09:35 AM    Clinical ASCVD: Yes  The ASCVD Risk score  (Arnett DK, et al., 2019) failed to calculate for the following reasons:   The patient has a prior MI or stroke diagnosis       08/10/2021    8:45 AM 07/15/2021    9:13 AM 06/30/2021    1:10 PM  Depression screen PHQ 2/9  Decreased Interest 0 0 0  Down, Depressed, Hopeless 0 0 0  PHQ - 2 Score 0 0 0  Altered sleeping 1  1  Tired, decreased energy 1  1  Change in appetite 0  1  Feeling bad or failure about yourself  0  0  Trouble concentrating 0  0  Moving slowly or fidgety/restless 0  0  Suicidal thoughts 0  0  PHQ-9 Score 2  3  Difficult doing work/chores Not difficult at all  Not difficult at all    Social History   Tobacco Use  Smoking Status Former   Packs/day: 0.50   Types: Cigarettes   Quit date: 10/07/2001   Years since quitting: 19.9  Smokeless Tobacco Never   BP Readings from Last 3 Encounters:  08/22/21 (!) 155/86  07/30/21 117/75  07/17/21 118/63   Pulse Readings from Last 3 Encounters:  08/22/21 78  07/30/21 81  07/17/21 88   Wt Readings from Last 3 Encounters:  08/22/21 180 lb 12.4 oz (82 kg)  08/10/21 171 lb (77.6 kg)  07/30/21 171 lb (77.6 kg)    Assessment/Interventions: Review of patient past medical history, allergies, medications, health status, including review of consultants reports, laboratory and other test data, was performed as part of comprehensive evaluation and provision of chronic care management services.   SDOH:  (Social Determinants of Health) assessments and interventions performed: Yes    CCM Care Plan  Allergies  Allergen Reactions   Tramadol Other (See Comments)    Causes patient to be off balance and Mental Changes   Lisinopril Cough   Penicillins Swelling, Rash and Other (See Comments)    Did it involve swelling of the face/tongue/throat, SOB, or low BP? Yes Did it involve sudden or severe rash/hives, skin peeling, or any reaction on the inside of your mouth or nose? Yes Did you need to seek medical attention at a  hospital or doctor's office? Yes When did it last happen?      15 years If all above answers are "NO", may proceed with cephalosporin use.     Medications Reviewed Today     Reviewed by Arby Barrette, CPhT (Pharmacy Technician) on 08/12/21 at [place] List Status: Complete   Medication Order Taking? Sig Documenting Provider Last Dose Status Informant  acetaminophen (TYLENOL) 325 MG  tablet [id number] No Take 650 mg by mouth every 6 (six) hours as needed for moderate pain or headache.  [provider] prn Active Self  albuterol (VENTOLIN HFA) 108 (90 Base) MCG/ACT inhaler [id number] No Inhale 2 puffs into the lungs every 6 (six) hours as needed for wheezing or shortness of breath. [name]., MD prn Active Self  ALPRAZolam Duanne Moron) 0.25 MG tablet [id number] No Take 1 tablet (0.25 mg total) by mouth every 8 (eight) hours as needed for anxiety. [name]., MD 08/09/2021 Active   bismuth subsalicylate (PEPTO BISMOL) 262 MG chewable tablet [id number]  Chew 524 mg by mouth daily as needed for diarrhea or loose stools or indigestion.  Patient not taking: Reported on 07/20/2021   [provider]  Active Self  Blood Glucose Monitoring Suppl [facility] VERIO) w/Device KIT [id number]  Use daily to check blood sugar Bacigalupo, Dionne Bucy, MD  Active Self  clopidogrel (PLAVIX) 75 MG tablet [id number] No Take 1 tablet (75 mg total) by mouth daily. [name]., MD 08/09/2021 Active Self  Dulaglutide (TRULICITY) 1.5 HW/8.0SU SOPN [id number] No Inject 1.5 mg into the skin once a week. Mikey Kirschner, PA-C 08/06/2021 Active Self           Med Note [facility], [place]   Wed Jul 15, 2021  9:26 AM) Thurdays  ELIQUIS 5 MG TABS tablet [id number] No Take 5 mg by mouth 2 (two) times daily. [provider] 08/09/2021 Active Self           Med Note Carlye Grippe, Evette Georges   Tue Aug 11, 2021  3:03 PM)    empagliflozin (JARDIANCE) 25 MG TABS tablet [id number] No Take  1 tablet (25 mg total) by mouth daily. Mikey Kirschner, PA-C 08/09/2021 Active Self  ezetimibe (ZETIA) 10 MG tablet [id number] No Take 1 tablet (10 mg total) by mouth daily. [name]., MD 08/09/2021 Active Self  furosemide (LASIX) 20 MG tablet [id number]  Take 20 mg by mouth daily.  Patient not taking: Reported on 08/10/2021   [provider]  Active Self           Med Note Laural Golden Aug 11, 2021  3:03 PM)    glucose blood test strip [id number]  Use to check blood sugars daily as instructed [name]., MD  Active Self  insulin glargine (LANTUS SOLOSTAR) 100 UNIT/ML Solostar Pen [id number] No Inject 18 Units into the skin at bedtime. Mikey Kirschner, PA-C 08/09/2021 Active Self  Insulin Pen Needle 32G X 4 MM MISC [id number]  Use to inject insulin daily [name]., MD  Active Self  isosorbide mononitrate (IMDUR) 120 MG 24 hr tablet [id number] No Take 1 tablet (120 mg total) by mouth daily. [name]., MD 08/09/2021 Active Self  Lancets (ONETOUCH DELICA PLUS [id number]) [place] [id number]  USE UP TO 4 TIMES DAILY AS DIRECTED [name]., MD  Active Self  loperamide (IMODIUM) 2 MG capsule [id number] No Take 2 mg by mouth daily as needed for diarrhea or loose stools. [provider] prn Active Self           Med Note Loann Quill   Tue Aug 11, 2021  3:03 PM)    metoprolol tartrate (LOPRESSOR) 100 MG tablet [id number] No Take 100 mg by mouth 2 (two) times daily. [provider] 08/09/2021 Active Self           Med Note (  Loann Quill   Tue Aug 11, 2021  3:04 PM)    nitroGLYCERIN (NITROSTAT) 0.4 MG SL tablet [id number] No Place 1 tablet (0.4 mg total) under the tongue every 5 (five) minutes as needed for chest pain. Bettey Costa, MD prn Active Self           Med Note Johny Drilling Mar 27, 2018  2:39 PM)    nystatin (NYSTATIN) powder [id number] No Apply 1 application topically daily. Rubie Maid, MD  prn Active Self  ondansetron (ZOFRAN-ODT) 8 MG disintegrating tablet [id number] No Take 1 tablet (8 mg total) by mouth every 8 (eight) hours as needed for refractory nausea / vomiting. Mikey Kirschner, PA-C prn Active Self           Med Note Loann Quill   Tue Aug 11, 2021  3:04 PM)    pantoprazole (PROTONIX) 20 MG tablet [id number] No Take 1 tablet (20 mg total) by mouth 2 (two) times daily. [name]., MD 08/09/2021 Active Self  rosuvastatin (CRESTOR) 40 MG tablet [id number] No Take 1 tablet (40 mg total) by mouth daily. [name]., MD 08/09/2021 Active Self  sacubitril-valsartan (ENTRESTO) 24-26 MG [id number] No Take 1 tablet by mouth 2 (two) times daily. Alisa Graff, [place] 08/09/2021 Active Self  sertraline (ZOLOFT) 100 MG tablet [id number] No Take 1 tablet (100 mg total) by mouth daily. [name]., MD 08/09/2021 Active Self            Patient Active Problem List   Diagnosis Date Noted   History of vertigo 08/16/2021   Obesity (BMI 30-39.9) 08/14/2021   Positive blood culture 08/13/2021   Atrial fibrillation with RVR ([place]) 08/13/2021   Hypokalemia 08/12/2021   COVID-19 08/11/2021   Orthostatic hypotension 08/11/2021   Left hip pain 06/30/2021   Paroxysmal atrial fibrillation ([place]) 07/10/2020   History of coronary artery bypass graft 07/10/2020   Vertigo    Unsteady gait    Acute bronchitis 03/07/2019   Esophageal dysphagia    Stomach irritation    Gastric polyp    Esophageal lump    Hx of colonic polyp    Polyp of colon    Diverticulosis of large intestine without diverticulitis    PSVT (paroxysmal supraventricular tachycardia) ([place]) 10/03/2018   Abnormal ECG 07/05/2018   Tachycardia 03/27/2018   Pain in limb 02/28/2018   Chronic diastolic CHF (congestive heart failure) ([place]) 11/03/2017   TIA (transient ischemic attack) 11/03/2017   COPD suggested by initial evaluation ([place]) 08/23/2017   Acute and chronic respiratory failure with  hypoxia ([place]) 08/23/2017   Postoperative state 08/15/2017   Incidental pulmonary nodule 08/01/2017   Non-ST elevation myocardial infarction (NSTEMI), subendocardial infarction, subsequent episode of care ([place]) 06/01/2017   B12 deficiency 12/01/2016   Vitamin D deficiency 11/04/2016   Depression with anxiety 09/30/2016   AF (paroxysmal atrial fibrillation) ([place]) 09/30/2016   Generalized weakness 09/30/2016   Resting tremor 09/30/2016   Mild obstructive sleep apnea 09/30/2016   Headache 08/18/2016   Unstable angina ([place]) 08/03/2016   Type 2 diabetes mellitus with diabetic polyneuropathy, with long-term current use of insulin ([place]) 08/03/2016   Chronic daily headache 05/05/2016   Asthma 11/11/2015   S/P CABG x 4 11/10/2015   Presence of aortocoronary bypass graft 11/10/2015   Coronary artery disease 11/07/2015   Carotid artery narrowing 02/08/2014   Chest pain 02/08/2014   Mixed hyperlipidemia 02/08/2014   Bilateral carotid artery stenosis 02/08/2014  Atypical chest pain 02/07/2014   Essential hypertension 02/07/2014   Awareness of heartbeats 02/07/2014   D (diarrhea) 10/05/2013   Gastroesophageal reflux disease 10/05/2013    Immunization History  Administered Date(s) Administered   Fluad Quad(high Dose 65+) 12/28/2018, 12/25/2020   Influenza, High Dose Seasonal PF 12/24/2016, 12/08/2017   Influenza,inj,Quad PF,6+ Mos 12/19/2015, 01/18/2020   Influenza-Unspecified 03/06/2015   Moderna Sars-Covid-2 Vaccination 04/14/2019, 05/12/2019, 01/28/2020   Pneumococcal Conjugate-13 03/16/2018   Pneumococcal Polysaccharide-23 12/24/2016   Tdap 07/28/2010   Zoster Recombinat (Shingrix) 06/14/2020    Conditions to be addressed/monitored:  Hypertension, Hyperlipidemia, Diabetes, Atrial Fibrillation, Heart Failure, Coronary Artery Disease, GERD, Asthma, Depression and Anxiety  There are no care plans that you recently modified to display for this patient.     Medication Assistance:  None required.  Patient affirms current coverage meets needs.  Patient's preferred pharmacy is:  [facility] - [place], [place] - [address] [place] [address] Phone: [phone] Fax: [phone]  [id number] [place]Phone: [phone]: [phone] [id number] [place] [address] [place]: [phone]: [phone] decided to: Utilize [facility] for medication synchronization, packaging and delivery   Care Plan and Follow Up Patient Decision:  Patient agrees to Care Plan and Follow-up.  Plan: Telephone follow up appointment with care management team member scheduled for:  09/04/2021 at 8:30 AM  AlJunius ArgylePharmD, BCMission HillsCPP  Clinical Pharmacist Practitioner  [phone] Barriers:  Unable to achieve control of diabetes  Unable to achieve control of cholesterol Does not adhere to prescribed medication regimen  Pharmacist Clinical Goal(s):  Over the next 90 days, patient will achieve control of diabetes as evidenced by A1c less than 8% patient will achieve control of cholesterol as evidenced by LDL less than 70 adhere to prescribed medication regimen as evidenced by utilization of enhanced pharmacy services through collaboration with PharmD and provider.   Interventions: 1:1 collaboration with GiJerrol Banana MD regarding development and update of comprehensive plan of care as evidenced by provider attestation and co-signature Inter-disciplinary care team collaboration (see longitudinal plan of care) Comprehensive medication review performed; medication list updated in electronic medical record  Diabetes (A1c goal <8%) -Uncontrolled -Diagnosed 2010 -Current medications:  Trulicity 1.5 mg  weekly on Thursdays: Appropriate, Query effective   Jardiance 25 mg daily: Appropriate, Query effective   Lantus 18 units nightly (0.24 u/kg): Appropriate, Query effective   -Medications previously tried: Glipizide, Lantus, Tradjenta, Metformin, Metformin XR, Ozempic   -Current home glucose readings: Has not been monitoring recently. -Denies hypoglycemic symptoms. -Nausea periodically throughout the week. -Current meal patterns: Light meals, mainly salads, yogurt.  -Current exercise: Less exercise due to the weather. Walking 3-4 times weekly (15-20 minutes)    -Continue current medications  Hyperlipidemia: (LDL goal < 55) -History of CABG x4 2017, 2018  -Uncontrolled -Current treatment: Rosuvastatin 40 mg daily: Appropriate, Query effective  Ezetimibe 10 mg daily: Appropriate, Query effective   -Current antiplatelet treatment: Clopidogrel 75 mg daily  -Medications previously tried: Atorvastatin (changed to rosuvastatin during hospitalization)  -Recommended to continue current medication -Recheck fasting lipid panel at PCP follow-up.   Atrial Fibrillation (Goal: prevent stroke and major bleeding) -Controlled -CHADSVASC: 6 -Current treatment: Rate control: Metoprolol tartrate 100 mg twice daily Anticoagulation: Eliquis  5 mg twice daily -Medications previously tried: NA -Denies symptoms of A-fib -Recommended to continue current medication  Heart Failure (Goal: manage symptoms and prevent exacerbations) -Controlled -Last ejection fraction: 55-60% (Date: June 2021) -HF type: Diastolic -NYHA Class: III (marked limitation of activity) -AHA HF Stage: C (Heart disease and symptoms present) -Current treatment: Entresto 24-26 mg twice daily  Furosemide 20 mg daily Imdur 120 mg daily  Metoprolol tartrate 100 mg twice daily -Medications previously tried: NA  -Current home BP/HR readings: 128/70  -Higher dose of Imdur has not made much difference. Chest pain improved somewhat. Went  shopping for the first time this weekend, significant shortness of breath.  -Recommended to continue current medication  Depression/Anxiety (Goal: maintain symptom remission) -Uncontrolled -Current treatment: Sertraline 100 mg daily  -Medications previously tried/failed: NA -PHQ9: 0 -GAD7: 2 - Patient reports she manages her stress by talking with daughter, reading, or medidating  -Feels worsening shakiness which she attributes to her anxiety, interested in increasing her sertraline at the recommendation of a nurse during her hospitalization.  -Given her low GAD-7 score, would not recommend increasing sertraline at this time. Recommend work-up by PCP to evaluate patient for tremor and chest pain.  -Continue current medications  GERD with dysphagia (Goal: prevent heartburn or reflux symptoms) -Controlled -Current treatment  Pantoprazole 40 mg daily   -Medications previously tried: Sucralfate,  -Continue current medications  Patient Goals/Self-Care Activities Over the next 90 days, patient will:  - check glucose daily, before breakfast, document, and provide at future appointments check blood pressure weekly, document, and provide at future appointments  Follow Up Plan: Telephone follow up appointment with care management team member scheduled for:  09/04/2021 at 8:30 AM

## 2021-09-07 NOTE — Progress Notes (Unsigned)
Established patient visit  I,Candace [name],acting as a scribe for Candace Durie, MD.,have documented all relevant documentation on the behalf of Candace Durie, MD,as directed by  Candace Durie, MD while in the presence of Candace Durie, MD.   Patient: [name]   DOB: [birth year]   71 y.o. Female  MRN: [id number] Visit Date: 09/07/2021  Today's healthcare provider: [name], MD   Chief Complaint  Patient presents with   Hospitalization Follow-up   Subjective    HPI  Patient comes in today for transition of care visit after hospitalization for HERDE-08 and complications in mid to late May.  She was still weak and was transferred for rehab and was discharged from rehab today.  She is getting ready go stay with her granddaughter in [name] for a while. Her daughter is with her.  Medication list is reconciled.  She is in good spirits and seems less anxious than in past visits.  Follow up Hospitalization  Patient was admitted to [facility] on 08/11/2021 and discharged on 08/22/2021. She was treated for Covid-19. Treatment for this included; see notes in chart. Telephone follow up was done on none She reports good compliance with treatment. She reports this condition is improved.  -----------------------------------------------------------------------------------------  Patient is still having chest pain occasionally.  Breathing is okay but she is not physically active yet. Again, she has been in rehab.  She is moving around some.  She has had no falls.  She is having no hypoglycemia.  Medications: Outpatient Medications Prior to Visit  Medication Sig   acetaminophen (TYLENOL) 325 MG tablet Take 650 mg by mouth every 6 (six) hours as needed for moderate pain or headache.    albuterol (VENTOLIN HFA) 108 (90 Base) MCG/ACT inhaler Inhale 2 puffs into the lungs every 6 (six) hours as needed for wheezing or shortness of breath.   ALPRAZolam (XANAX)  0.25 MG tablet Take 1 tablet (0.25 mg total) by mouth every 8 (eight) hours as needed for anxiety.   Blood Glucose Monitoring Suppl (ONETOUCH VERIO) w/Device KIT Use daily to check blood sugar   clopidogrel (PLAVIX) 75 MG tablet Take 1 tablet (75 mg total) by mouth daily.   Dulaglutide (TRULICITY) 1.5 XK/4.8JE SOPN Inject 1.5 mg into the skin once a week.   ELIQUIS 5 MG TABS tablet Take 5 mg by mouth 2 (two) times daily.   empagliflozin (JARDIANCE) 25 MG TABS tablet Take 1 tablet (25 mg total) by mouth daily.   ezetimibe (ZETIA) 10 MG tablet Take 1 tablet (10 mg total) by mouth daily.   glucose blood test strip Use to check blood sugars daily as instructed   insulin glargine (LANTUS SOLOSTAR) 100 UNIT/ML Solostar Pen Inject 12 Units into the skin at bedtime.   Insulin Pen Needle 32G X 4 MM MISC Use to inject insulin daily   isosorbide mononitrate (IMDUR) 120 MG 24 hr tablet Take 1 tablet (120 mg total) by mouth daily.   Lancets (ONETOUCH DELICA PLUS [id number]) MISC USE UP TO 4 TIMES DAILY AS DIRECTED   loperamide (IMODIUM) 2 MG capsule Take 2 mg by mouth daily as needed for diarrhea or loose stools.   metoprolol tartrate (LOPRESSOR) 100 MG tablet Take 100 mg by mouth 2 (two) times daily.   nitroGLYCERIN (NITROSTAT) 0.4 MG SL tablet Place 1 tablet (0.4 mg total) under the tongue every 5 (five) minutes as needed for chest pain.   nystatin (NYSTATIN) powder Apply 1 application topically  daily.   ondansetron (ZOFRAN-ODT) 8 MG disintegrating tablet Take 1 tablet (8 mg total) by mouth every 8 (eight) hours as needed for refractory nausea / vomiting.   pantoprazole (PROTONIX) 20 MG tablet Take 1 tablet (20 mg total) by mouth 2 (two) times daily.   rosuvastatin (CRESTOR) 40 MG tablet Take 1 tablet (40 mg total) by mouth daily.   sacubitril-valsartan (ENTRESTO) 24-26 MG Take 1 tablet by mouth 2 (two) times daily.   sertraline (ZOLOFT) 100 MG tablet Take 1 tablet (100 mg total) by mouth daily.   No  facility-administered medications prior to visit.    Review of Systems  Constitutional:  Negative for appetite change, chills, fatigue and fever.  Respiratory:  Negative for chest tightness and shortness of breath.   Cardiovascular:  Negative for chest pain and palpitations.  Gastrointestinal:  Negative for abdominal pain, nausea and vomiting.  Neurological:  Negative for dizziness and weakness.    Last hemoglobin A1c Lab Results  Component Value Date   HGBA1C 8.2 (H) 07/30/2021       Objective    BP 102/60 (BP Location: Left Arm, Patient Position: Sitting, Cuff Size: Large)   Pulse 79   Resp 18   SpO2 98%  BP Readings from Last 3 Encounters:  09/07/21 102/60  08/22/21 (!) 155/86  07/30/21 117/75   Wt Readings from Last 3 Encounters:  08/22/21 180 lb 12.4 oz (82 kg)  08/10/21 171 lb (77.6 kg)  07/30/21 171 lb (77.6 kg)      Physical Exam Vitals and nursing note reviewed.  Constitutional:      Appearance: Normal appearance. She is normal weight.  HENT:     Right Ear: Tympanic membrane normal.     Left Ear: Tympanic membrane normal.     Nose: Nose normal.     Mouth/Throat:     Mouth: Mucous membranes are moist.     Pharynx: Oropharynx is clear.  Eyes:     Conjunctiva/sclera: Conjunctivae normal.  Cardiovascular:     Rate and Rhythm: Normal rate and regular rhythm.     Pulses: Normal pulses.     Heart sounds: Normal heart sounds.  Pulmonary:     Effort: Pulmonary effort is normal.     Breath sounds: Normal breath sounds.  Abdominal:     General: Bowel sounds are normal.     Palpations: Abdomen is soft.  Musculoskeletal:        General: Normal range of motion.     Cervical back: Normal range of motion and neck supple.  Skin:    General: Skin is warm and dry.  Neurological:     Mental Status: She is alert.  Psychiatric:        Mood and Affect: Mood normal.        Behavior: Behavior normal.        Thought Content: Thought content normal.        Judgment:  Judgment normal.       No results found for any visits on 09/07/21.  Assessment & Plan     1. COVID-19 Patient is about 1 month out from a COVID infection.  She has been in rehab after the hospitalization and is improved other than fatigue.  This is also improved but not quite resolved.  Patient is going to visit with her granddaughter for some time in [place]. - CBC w/Diff/Platelet - Comprehensive Metabolic Panel (CMET) - TSH  2. Type 2 diabetes mellitus with diabetic polyneuropathy, with long-term current use  of insulin ([place]) Follow-up A1c in the future.  Diabetes has been poorly controlled in the past but hopefully this will be better going forward - CBC w/Diff/Platelet - Comprehensive Metabolic Panel (CMET) - TSH  3. PAF (paroxysmal atrial fibrillation) (HCC) On Eliquis - CBC w/Diff/Platelet - Comprehensive Metabolic Panel (CMET) - TSH  4. Coronary artery disease involving native coronary artery of native heart with unstable angina pectoris ([place]) All risk factors treated - CBC w/Diff/Platelet - Comprehensive Metabolic Panel (CMET) - TSH  5. Chronic diastolic CHF (congestive heart failure) ([place]) On Entresto. - CBC w/Diff/Platelet - Comprehensive Metabolic Panel (CMET) - TSH  6. Other fatigue Chronic but worse since COVID - CBC w/Diff/Platelet - Comprehensive Metabolic Panel (CMET) - TSH  7. Essential hypertension Excellent control with no hypotension even on Entresto  8. Unsteady gait Working on rehab  9. S/P CABG x 4    No follow-ups on file.      I, Candace Durie, MD, have reviewed all documentation for this visit. The documentation on 09/09/21 for the exam, diagnosis, procedures, and orders are all accurate and complete.     Cranford Mon, MD  [facility] [phone] (phone) [phone] (fax)  [place]

## 2021-09-08 LAB — COMPREHENSIVE METABOLIC PANEL
ALT: 38 IU/L — ABNORMAL HIGH (ref 0–32)
AST: 30 IU/L (ref 0–40)
Albumin/Globulin Ratio: 1.9 (ref 1.2–2.2)
Albumin: 4.1 g/dL (ref 3.8–4.8)
Alkaline Phosphatase: 125 IU/L — ABNORMAL HIGH (ref 44–121)
BUN/Creatinine Ratio: 15 (ref 12–28)
BUN: 14 mg/dL (ref 8–27)
Bilirubin Total: <0.2 mg/dL (ref 0.0–1.2)
CO2: 21 mmol/L (ref 20–29)
Calcium: 9.2 mg/dL (ref 8.7–10.3)
Chloride: 104 mmol/L (ref 96–106)
Creatinine, Ser: 0.94 mg/dL (ref 0.57–1.00)
Globulin, Total: 2.2 g/dL (ref 1.5–4.5)
Glucose: 297 mg/dL — ABNORMAL HIGH (ref 70–99)
Potassium: 4 mmol/L (ref 3.5–5.2)
Sodium: 140 mmol/L (ref 134–144)
Total Protein: 6.3 g/dL (ref 6.0–8.5)
eGFR: 65 mL/min/{1.73_m2} (ref >59)

## 2021-09-08 LAB — CBC WITH DIFFERENTIAL/PLATELET
Basophils Absolute: 0 10*3/uL (ref 0.0–0.2)
Basos: 1 %
EOS (ABSOLUTE): 0.2 10*3/uL (ref 0.0–0.4)
Eos: 2 %
Hematocrit: 37.1 % (ref 34.0–46.6)
Hemoglobin: 12.2 g/dL (ref 11.1–15.9)
Immature Grans (Abs): 0 10*3/uL (ref 0.0–0.1)
Immature Granulocytes: 0 %
Lymphocytes Absolute: 3 10*3/uL (ref 0.7–3.1)
Lymphs: 43 %
MCH: 31.4 pg (ref 26.6–33.0)
MCHC: 32.9 g/dL (ref 31.5–35.7)
MCV: 96 fL (ref 79–97)
Monocytes Absolute: 0.6 10*3/uL (ref 0.1–0.9)
Monocytes: 8 %
Neutrophils Absolute: 3.2 10*3/uL (ref 1.4–7.0)
Neutrophils: 46 %
Platelets: 186 10*3/uL (ref 150–450)
RBC: 3.88 x10E6/uL (ref 3.77–5.28)
RDW: 12.8 % (ref 11.7–15.4)
WBC: 7 10*3/uL (ref 3.4–10.8)

## 2021-09-08 LAB — TSH: TSH: 1.56 u[IU]/mL (ref 0.450–4.500)

## 2021-09-09 ENCOUNTER — Telehealth: Payer: Self-pay

## 2021-09-09 DIAGNOSIS — Z794 Long term (current) use of insulin: Secondary | ICD-10-CM

## 2021-09-09 NOTE — Progress Notes (Cosign Needed)
Chronic Care Management Pharmacy Assistant   Name: [name]  MRN: [id number] DOB: 1951/01/09  Reason for Encounter: Medication Review/Medication Coordination Call.   Recent office visits:  09/07/2021 Dr. Rosanna Randy MD (PCP) No Medication Changes noted  Recent consult visits:  None ID  Hospital visits:  Medication Reconciliation was completed by comparing discharge summary, patient's EMR and Pharmacy list, and upon discussion with patient.  Admitted to the hospital on 08/11/2021 due to COVID-19. Discharge date was 08/22/2021. Discharged from [facility]?Medications Started at [facility] Discharge:?? -started None ID  Medication Changes at Hospital Discharge: -Changed None ID  Medications Discontinued at Hospital Discharge: -Stopped bismuth subsalicylate  -Stopped furosemide  Medications that remain the same after Hospital Discharge:??  -All other medications will remain the same.    Medications: Outpatient Encounter Medications as of 09/09/2021  Medication Sig Note   acetaminophen (TYLENOL) 325 MG tablet Take 650 mg by mouth every 6 (six) hours as needed for moderate pain or headache.     albuterol (VENTOLIN HFA) 108 (90 Base) MCG/ACT inhaler Inhale 2 puffs into the lungs every 6 (six) hours as needed for wheezing or shortness of breath.    ALPRAZolam (XANAX) 0.25 MG tablet Take 1 tablet (0.25 mg total) by mouth every 8 (eight) hours as needed for anxiety.    Blood Glucose Monitoring Suppl (ONETOUCH VERIO) w/Device KIT Use daily to check blood sugar    clopidogrel (PLAVIX) 75 MG tablet Take 1 tablet (75 mg total) by mouth daily.    Dulaglutide (TRULICITY) 1.5 KP/5.4SF SOPN Inject 1.5 mg into the skin once a week. 07/15/2021: Thurdays   ELIQUIS 5 MG TABS tablet Take 5 mg by mouth 2 (two) times daily.    empagliflozin (JARDIANCE) 25 MG TABS tablet Take 1 tablet (25 mg total) by mouth daily.    ezetimibe (ZETIA) 10 MG tablet Take 1 tablet (10 mg total)  by mouth daily.    glucose blood test strip Use to check blood sugars daily as instructed    insulin glargine (LANTUS SOLOSTAR) 100 UNIT/ML Solostar Pen Inject 12 Units into the skin at bedtime.    Insulin Pen Needle 32G X 4 MM MISC Use to inject insulin daily    isosorbide mononitrate (IMDUR) 120 MG 24 hr tablet Take 1 tablet (120 mg total) by mouth daily.    Lancets (ONETOUCH DELICA PLUS [id number]) MISC USE UP TO 4 TIMES DAILY AS DIRECTED    loperamide (IMODIUM) 2 MG capsule Take 2 mg by mouth daily as needed for diarrhea or loose stools.    metoprolol tartrate (LOPRESSOR) 100 MG tablet Take 100 mg by mouth 2 (two) times daily.    nitroGLYCERIN (NITROSTAT) 0.4 MG SL tablet Place 1 tablet (0.4 mg total) under the tongue every 5 (five) minutes as needed for chest pain.    nystatin (NYSTATIN) powder Apply 1 application topically daily.    ondansetron (ZOFRAN-ODT) 8 MG disintegrating tablet Take 1 tablet (8 mg total) by mouth every 8 (eight) hours as needed for refractory nausea / vomiting.    pantoprazole (PROTONIX) 20 MG tablet Take 1 tablet (20 mg total) by mouth 2 (two) times daily.    rosuvastatin (CRESTOR) 40 MG tablet Take 1 tablet (40 mg total) by mouth daily.    sacubitril-valsartan (ENTRESTO) 24-26 MG Take 1 tablet by mouth 2 (two) times daily.    sertraline (ZOLOFT) 100 MG tablet Take 1 tablet (100 mg total) by mouth daily.    [DISCONTINUED]  cetirizine (ZYRTEC) 5 MG tablet Take 2 tablets (10 mg total) by mouth daily.    No facility-administered encounter medications on file as of 09/09/2021.    Care Gaps: COVID 19 Vaccine (4- Booster) Ophthalmology Exam (Last Completed 06/28/2019) Tetanus Vaccine (Last Completed 07/28/2010) Shingrix Vaccine (2 of 2) (Last Completed 06/14/2020) Foot Exam (Last Completed 10/27/2019)   Star Rating Drugs: 08/22/2021 Rosuvastatin 40 mg last filled for a 30- Day supply at [facility] 05/18/2021 Losartan 25 mg last filled for a 90-Day supply at [facility] 08/22/2021 Jardiance 25 mg last filled for a 30-Day Supply at [facility] 03/21/8249 Trulicity 1.5 mg last filled for a 84-Day supply at [facility]   Medication Fill Gaps: None ID  Reviewed chart for medication changes ahead of medication coordination call.   BP Readings from Last 3 Encounters:  09/07/21 102/60  08/22/21 (!) 155/86  07/30/21 117/75    Lab Results  Component Value Date   HGBA1C 8.2 (H) 07/30/2021     Patient obtains medications through [facility] Pack[facility]  90 Days   Per [facility] data:  Last adherence delivery included: Pantoprazole 20 mg one tablet two times a day- breakfast, Evening meals Losartan 25 mg one tablet daily- Bedtime Eliquis 5 mg 1 tablet two times daily breakfast, evening meals Jardiance 25 mg 1 tablet daily before breakfast Rosuvastatin 40 mg 1 tablet daily - Breakfast Sertraline 100 mg 1 tablet daily breakfast  Metoprolol tartrate 100 mg 1 tablet 2 times daily Breakfast and Evening Meals Isosorbide Mononitrate 120 mg 1 tablet daily - Breakfast Ondansetron 8 mg PRN Nitroglycerin 0.4 mg PRN Onetouch Ultra Test Strip   Patient declined medications last month: Trulicity inject 1.5 mg into the skin weekly (last filled 06/30/2021 for 84 day supply at [facility])  Insulin Glargine 100 unit/ML Solostar Pen- Inject 14 units into the skin at bedtime (last filled 06/30/2021 for 84 day supply from [facility]) Pen Needles - adequate supply (last filled 06/30/2021 for 90 day at [facility]) ezetimibe 10 mg 1 tablet daily breakfast - (receive 90 day supply from [facility] on 03/24/2021). Clopidogrel 75 mg 1 tablet daily breakfast (receive 90 day supply [facility] on 03/24/2021).  Patient is due for next adherence delivery on:  09/21/2021. Called patient and reviewed medications and coordinated delivery.  Patient states she was in rehab for about 28 days (she just got out on 09/07/2021), and received her  medication through them.Patient reports the only thing she needs refill is her pen needles and Trulicity.Patient is asking if the Clinical Pharmacist can [facility] [place] on [address] since she is staying with her niece until she gets better. Patient reports she should be able to go home in a few weeks. Patient states she has about 30 day supply on hand as of 09/10/2021 of her other medications.Notified Clinical Pharmacist.   This delivery to include: None ID   Patient declined the following medications: Pantoprazole 20 mg one tablet two times a day- breakfast, Evening meals Losartan 25 mg one tablet daily- Bedtime Eliquis 5 mg 1 tablet two times daily breakfast, evening meals Jardiance 25 mg 1 tablet daily before breakfast Rosuvastatin 40 mg 1 tablet daily - Breakfast Sertraline 100 mg 1 tablet daily breakfast  Metoprolol tartrate 100 mg 1 tablet 2 times daily Breakfast and Evening Meals Isosorbide Mononitrate 120 mg 1 tablet daily - Breakfast Ondansetron 8 mg PRN Nitroglycerin 0.4 mg PRN Onetouch Ultra Test Strip  Insulin Glargine 100  unit/ML Solostar Pen- Inject 14 units into the skin at bedtime ezetimibe 10 mg 1 tablet daily breakfast  Clopidogrel 75 mg 1 tablet daily breakfast   Patient needs refills for Trulicity , Pen needles,Losartan.  Confirmed delivery date of None ID, advised patient that pharmacy will contact them the morning of delivery.  [place] Pharmacist Assistant [phone]

## 2021-09-10 MED ORDER — INSULIN PEN NEEDLE 32G X 4 MM MISC: 0 refills | Status: DC

## 2021-09-10 MED ORDER — TRULICITY 1.5 MG/0.5ML ~~LOC~~ SOAJ: 1.5000 mg | SUBCUTANEOUS | 0 refills | Status: DC

## 2021-09-10 NOTE — Addendum Note (Signed)
Addended by: Daron Offer A on: 09/10/2021 10:38 AM   Modules accepted: Orders

## 2021-09-12 ENCOUNTER — Other Ambulatory Visit: Payer: Self-pay | Admitting: Physician Assistant

## 2021-09-12 DIAGNOSIS — E1142 Type 2 diabetes mellitus with diabetic polyneuropathy: Secondary | ICD-10-CM

## 2021-09-14 ENCOUNTER — Ambulatory Visit (INDEPENDENT_AMBULATORY_CARE_PROVIDER_SITE_OTHER): Payer: 59

## 2021-09-14 DIAGNOSIS — I5032 Chronic diastolic (congestive) heart failure: Secondary | ICD-10-CM

## 2021-09-14 DIAGNOSIS — E1142 Type 2 diabetes mellitus with diabetic polyneuropathy: Secondary | ICD-10-CM

## 2021-09-14 NOTE — Telephone Encounter (Signed)
Change of pharmacy  Requested Prescriptions  Pending Prescriptions Disp Refills  . TRULICITY 1.5 CV/8.9FY SOPN [Pharmacy Med Name: Trulicity 1.5 BO/1.7 mL subcutaneous pen injector] 2 mL 0    Sig: INJECT 1.5 MG into [place]     Endocrinology:  Diabetes - GLP-1 Receptor Agonists Failed - 09/12/2021  8:02 AM      Failed - HBA1C is between 0 and 7.9 and within 180 days    Hemoglobin A1C  Date Value Ref Range Status  07/10/2019 9.2  Final   Hgb A1c MFr Bld  Date Value Ref Range Status  07/30/2021 8.2 (H) 4.8 - 5.6 % Final    Comment:             Prediabetes: 5.7 - 6.4          Diabetes: >6.4          Glycemic control for adults with diabetes: <7.0          Passed - Valid encounter within last 6 months    Recent Outpatient Visits          1 week ago [facility] [name]., MD   1 month ago Type 2 diabetes mellitus with diabetic polyneuropathy, with long-term current use of insulin [facility])   [facility] [name]., MD   2 months ago Left hip pain   [facility] Mikey Kirschner, PA-C   3 months ago Chest pain, unspecified type   [facility] [name]., MD   8 months ago Right ear pain   [facility] [name]., MD      Future Appointments            In 2 weeks Gollan, Kathlene November, MD [facility]   In 2 months [name]., MD [facility], [place]   In 3 months [name]., MD [facility], [place]

## 2021-09-14 NOTE — Patient Instructions (Signed)
Visit Information It was great speaking with you today!  Please let me know if you have any questions about our visit.   Goals Addressed             This Visit's Progress    Monitor and Manage My Blood Sugar-Diabetes Type 2   Not on track    Timeframe:  Long-Range Goal Priority:  High Start Date: 04/15/20      Expected End Date: 02/26/2022                  Follow Up within 30 days   - Check blood sugar daily before breakfast - Check blood sugar if I feel it is too high or too low - Enter blood sugar readings and medication or insulin into daily log - Take the blood sugar log to all doctor visits - Take the blood sugar meter to all doctor visits           Patient Care Plan: General Pharmacy (Adult)     Problem Identified: Hypertension, Hyperlipidemia, Diabetes, Atrial Fibrillation, Heart Failure, Coronary Artery Disease, GERD, Asthma, Depression and Anxiety   Priority: High     Long-Range Goal: Patient-Specific Goal   Start Date: 06/06/2020  Expected End Date: 11/25/2021  This Visit's Progress: On track  Recent Progress: On track  Priority: High  Note:   Current Barriers:  Unable to achieve control of diabetes  Unable to achieve control of cholesterol Does not adhere to prescribed medication regimen  Pharmacist Clinical Goal(s):  Over the next 90 days, patient will achieve control of diabetes as evidenced by A1c less than 8% patient will achieve control of cholesterol as evidenced by LDL less than 70 adhere to prescribed medication regimen as evidenced by utilization of enhanced pharmacy services through collaboration with PharmD and provider.   Interventions: 1:1 collaboration with [name]., MD regarding development and update of comprehensive plan of care as evidenced by provider attestation and co-signature Inter-disciplinary care team collaboration (see longitudinal plan of care) Comprehensive medication review performed; medication list updated in  electronic medical record  Diabetes (A1c goal <8%) -Uncontrolled -Diagnosed 2010 -Current medications:  Trulicity 1.5 mg weekly on Thursdays: Appropriate, Query effective   Jardiance 25 mg daily: Appropriate, Query effective   Lantus 12 units nightly (0.24 u/kg): Appropriate, Query effective   -Medications previously tried: Glipizide, Lantus, Tradjenta, Metformin, Metformin XR, Ozempic   -Current home glucose readings: 320 post-prandial  -Denies hypoglycemic symptoms. -Nausea periodically throughout the week. -Current meal patterns: Light meals, mainly salads, yogurt.  -Current exercise: Less exercise due to the weather. Walking 3-4 times weekly (15-20 minutes)    -Continue current medications -Increase monitoring to 1-2x daily, recheck readings in one week.   Hyperlipidemia: (LDL goal < 55) -History of CABG x4 2017, 2018  -Uncontrolled -Current treatment: Rosuvastatin 40 mg daily: Appropriate, Query effective  Ezetimibe 10 mg daily: Appropriate, Query effective   -Current antiplatelet treatment: Clopidogrel 75 mg daily  -Medications previously tried: Atorvastatin (changed to rosuvastatin during hospitalization)  -Recommended to continue current medication -Recheck fasting lipid panel at PCP follow-up.   Atrial Fibrillation (Goal: prevent stroke and major bleeding) -Controlled -CHADSVASC: 6 -Current treatment: Rate control: Metoprolol tartrate 100 mg twice daily Anticoagulation: Eliquis 5 mg twice daily -Medications previously tried: NA -Denies symptoms of A-fib -Recommended to continue current medication  Heart Failure (Goal: manage symptoms and prevent exacerbations) -Controlled -Last ejection fraction: 55-60% (Date: June 2021) -HF type: Diastolic -NYHA Class: III (marked limitation of activity) -  AHA HF Stage: C (Heart disease and symptoms present) -Current treatment: Entresto 24-26 mg twice daily  Imdur 120 mg daily  Metoprolol tartrate 100 mg twice  daily -Medications previously tried: Furosemide  -Current home BP/HR readings: 110-140s/60-70s  -Patient reports feeling lightheaded, chest pain. She did not bring her nitroglycerin with her to [place], [facility]  -Recommended to continue current medication  Depression/Anxiety (Goal: maintain symptom remission) -Uncontrolled -Current treatment: Sertraline 100 mg daily  -Medications previously tried/failed: NA -PHQ9: 0 -GAD7: 2 -Continue current medications  GERD with dysphagia (Goal: prevent heartburn or reflux symptoms) -Controlled -Current treatment  Pantoprazole 40 mg daily   -Medications previously tried: Sucralfate,  -Continue current medications  Patient Goals/Self-Care Activities Over the next 90 days, patient will:  - check glucose daily, before breakfast, document, and provide at future appointments check blood pressure weekly, document, and provide at future appointments  Follow Up Plan: Telephone follow up appointment with care management team member scheduled for:  10/16/2021 at 8:30 AM    Patient agreed to services and verbal consent obtained.   The patient verbalized understanding of instructions, educational materials, and care plan provided today and DECLINED offer to receive copy of patient instructions, educational materials, and care plan.   Junius Argyle, PharmD, [place], CPP  Clinical Pharmacist Practitioner  [facility] [phone]

## 2021-09-14 NOTE — Progress Notes (Signed)
Chronic Care Management Pharmacy Note  09/14/2021 Name:  Candace [name] MRN:  [id number] DOB:  [birth year]  Summary: Patient presents for CCM follow-up. She has been discharged from rehab for one week and is currently staying with family in [place] while building her strength up.   -She has not been checking her blood sugar consistently but reports daughter checked post-prandially and was 300+.   -Patient reports feeling lightheaded, chest pain. She did not bring her nitroglycerin [facility], [facility]   Recommendations/Changes made from today's visit: -Continue current medications  -Increase monitoring to 1-2x daily, recheck readings in one week.   Plan: CPP Follow-up in 1 month  HC DM Assessment 1-2 weeks   Subjective: Candace [name] is an 71 y.o. year old female who is a primary patient of [name]., MD.  The CCM team was consulted for assistance with disease management and care coordination needs.    Engaged with patient by telephone for follow up visit in response to provider referral for pharmacy case management and/or care coordination services.   Consent to Services:  The patient was given information about Chronic Care Management services, agreed to services, and gave verbal consent prior to initiation of services.  Please see initial visit note for detailed documentation.   Patient Care Team: [name]., MD as PCP - General (Family Medicine) Corey Skains, MD as Consulting Physician (Cardiology) Sharlotte Alamo, DPM (Podiatry) Neldon Labella, RN as Case Manager Virgel Manifold, MD (Inactive) as Consulting Physician (Gastroenterology) [facility], [place] as Pharmacist (Pharmacist)  Recent office visits: 09/07/21: Patient presented to Dr. Rosanna Randy for follow-up.  06/30/21: Patient presented to Mikey Kirschner, PA-C for hip pain. Tramadol.   Recent consult  visits: 07/15/21: Patient presented to Darylene Price, [place] for follow-up.  03/12/21: Patient presented to Dr. Nehemiah Massed (Cardiology) for follow-up. No medication changes noted.   Hospital visits: 07/15/21-07/17/21: Patient hospitalized for chest pain.   Objective:  Lab Results  Component Value Date   CREATININE 0.94 09/07/2021   BUN 14 09/07/2021   GFRNONAA >60 08/18/2021   GFRAA 108 02/06/2020   NA 140 09/07/2021   K 4.0 09/07/2021   CALCIUM 9.2 09/07/2021   CO2 21 09/07/2021    Lab Results  Component Value Date/Time   HGBA1C 8.2 (H) 07/30/2021 02:45 PM   HGBA1C 8.5 (H) 03/26/2021 09:07 AM   HGBA1C 9.2 07/10/2019 12:00 AM   HGBA1C 8.3 (H) 07/14/2013 05:42 AM   MICROALBUR 50 08/11/2020 08:51 AM    Last diabetic Eye exam:  Lab Results  Component Value Date/Time   HMDIABEYEEXA No Retinopathy 06/28/2019 12:00 AM    Last diabetic Foot exam: No results found for: "HMDIABFOOTEX"   Lab Results  Component Value Date   CHOL 164 08/11/2020   HDL 57 08/11/2020   LDLCALC 90 08/11/2020   TRIG 94 08/11/2020   CHOLHDL 2.9 08/11/2020       Latest Ref Rng & Units 09/07/2021    4:32 PM 08/18/2021    4:35 AM 08/16/2021    6:16 AM  Hepatic Function  Total Protein 6.0 - 8.5 g/dL 6.3  6.3  6.6   Albumin 3.8 - 4.8 g/dL 4.1  3.2  3.3   AST 0 - 40 IU/L 30  38  61   ALT 0 - 32 IU/L 38  39  39   Alk Phosphatase 44 - 121 IU/L 125  99  85   Total Bilirubin 0.0 - 1.2 mg/dL <0.2  0.4  0.6     Lab Results  Component Value Date/Time   TSH 1.560 09/07/2021 04:32 PM   TSH 1.320 06/10/2021 04:03 PM       Latest Ref Rng & Units 09/07/2021    4:32 PM 08/14/2021    4:41 AM 08/13/2021    5:18 AM  CBC  WBC 3.4 - 10.8 x10E3/uL 7.0  4.7  4.6   Hemoglobin 11.1 - 15.9 g/dL 12.2  12.1  11.7   Hematocrit 34.0 - 46.6 % 37.1  38.7  37.1   Platelets 150 - 450 x10E3/uL 186  151  143     Lab Results  Component Value Date/Time   VD25OH 12.2 (L) 10/01/2016 09:35 AM    Clinical ASCVD: Yes  The ASCVD  Risk score (Arnett DK, et al., 2019) failed to calculate for the following reasons:   The patient has a prior MI or stroke diagnosis       08/10/2021    8:45 AM 07/15/2021    9:13 AM 06/30/2021    1:10 PM  Depression screen PHQ 2/9  Decreased Interest 0 0 0  Down, Depressed, Hopeless 0 0 0  PHQ - 2 Score 0 0 0  Altered sleeping 1  1  Tired, decreased energy 1  1  Change in appetite 0  1  Feeling bad or failure about yourself  0  0  Trouble concentrating 0  0  Moving slowly or fidgety/restless 0  0  Suicidal thoughts 0  0  PHQ-9 Score 2  3  Difficult doing work/chores Not difficult at all  Not difficult at all    Social History   Tobacco Use  Smoking Status Former   Packs/day: 0.50   Types: Cigarettes   Quit date: 10/07/2001   Years since quitting: 19.9  Smokeless Tobacco Never   BP Readings from Last 3 Encounters:  09/07/21 102/60  08/22/21 (!) 155/86  07/30/21 117/75   Pulse Readings from Last 3 Encounters:  09/07/21 79  08/22/21 78  07/30/21 81   Wt Readings from Last 3 Encounters:  08/22/21 180 lb 12.4 oz (82 kg)  08/10/21 171 lb (77.6 kg)  07/30/21 171 lb (77.6 kg)    Assessment/Interventions: Review of patient past medical history, allergies, medications, health status, including review of consultants reports, laboratory and other test data, was performed as part of comprehensive evaluation and provision of chronic care management services.   SDOH:  (Social Determinants of Health) assessments and interventions performed: Yes    CCM Care Plan  Allergies  Allergen Reactions   Tramadol Other (See Comments)    Causes patient to be off balance and Mental Changes   Lisinopril Cough   Penicillins Swelling, Rash and Other (See Comments)    Did it involve swelling of the face/tongue/throat, SOB, or low BP? Yes Did it involve sudden or severe rash/hives, skin peeling, or any reaction on the inside of your mouth or nose? Yes Did you need to seek medical attention  at a hospital or doctor's office? Yes When did it last happen?      15 years If all above answers are "NO", may proceed with cephalosporin use.     Medications Reviewed Today     Reviewed by Julieta Bellini, CMA (Certified Medical Assistant) on 09/07/21 at 1545  Med List Status: <None>   Medication Order Taking? Sig Documenting Provider Last Dose Status Informant  acetaminophen (TYLENOL) 325  MG tablet [id number] Yes Take 650 mg by mouth every 6 (six) hours as needed for moderate pain or headache.  [provider] Taking Active Self  albuterol (VENTOLIN HFA) 108 (90 Base) MCG/ACT inhaler [id number] Yes Inhale 2 puffs into the lungs every 6 (six) hours as needed for wheezing or shortness of breath. [name]., MD Taking Active Self  ALPRAZolam Duanne Moron) 0.25 MG tablet [id number] Yes Take 1 tablet (0.25 mg total) by mouth every 8 (eight) hours as needed for anxiety. [name]., MD Taking Active Self  Blood Glucose Monitoring Suppl [facility] VERIO) w/Device Drucie Opitz [id number] Yes Use daily to check blood sugar Brita Romp Dionne Bucy, MD Taking Active Self    Discontinued 03/06/19 1830   clopidogrel (PLAVIX) 75 MG tablet [id number] Yes Take 1 tablet (75 mg total) by mouth daily. [name]., MD Taking Active Self  Dulaglutide (TRULICITY) 1.5 NI/6.2VO [place] Aid [id number] Yes Inject 1.5 mg into the skin once a week. Mikey Kirschner, PA-C Taking Active Self           Med Note [facility], RAQUEL   Wed Jul 15, 2021  9:26 AM) Thurdays  ELIQUIS 5 MG TABS tablet [id number] Yes Take 5 mg by mouth 2 (two) times daily. [provider] Taking Active Self           Med Note Loann Quill   Tue Aug 11, 2021  3:03 PM)    empagliflozin (JARDIANCE) 25 MG TABS tablet [id number] Yes Take 1 tablet (25 mg total) by mouth daily. Mikey Kirschner, PA-C Taking Active Self  ezetimibe (ZETIA) 10 MG tablet [id number] Yes Take 1 tablet (10 mg total) by mouth daily. [name]., MD Taking Active Self  glucose blood test strip [id number] Yes Use to check blood sugars daily as instructed [name]., MD Taking Active Self  insulin glargine (LANTUS SOLOSTAR) 100 UNIT/ML Solostar Pen [id number] Yes Inject 12 Units into the skin at bedtime. Jennye Boroughs, MD Taking Active   Insulin Pen Needle 32G X 4 MM MISC [id number] Yes Use to inject insulin daily [name]., MD Taking Active Self  isosorbide mononitrate (IMDUR) 120 MG 24 hr tablet [id number] Yes Take 1 tablet (120 mg total) by mouth daily. [name]., MD Taking Active Self  Lancets (ONETOUCH DELICA PLUS [id number]) [place] [id number] Yes USE UP TO 4 TIMES DAILY AS DIRECTED [name]., MD Taking Active Self  loperamide (IMODIUM) 2 MG capsule [id number] Yes Take 2 mg by mouth daily as needed for diarrhea or loose stools. [provider] Taking Active Self           Med Note Loann Quill   Tue Aug 11, 2021  3:03 PM)    metoprolol tartrate (LOPRESSOR) 100 MG tablet [id number] Yes Take 100 mg by mouth 2 (two) times daily. [provider] Taking Active Self           Med Note Lawson Radar S   Tue Aug 11, 2021  3:04 PM)    nitroGLYCERIN (NITROSTAT) 0.4 MG SL tablet [id number] Yes Place 1 tablet (0.4 mg total) under the tongue every 5 (five) minutes as needed for chest pain. Bettey Costa, MD Taking Active Self           Med Note Johny Drilling Mar 27, 2018  2:39 PM)    nystatin (NYSTATIN) powder [id number] Yes Apply 1 application topically daily. Rubie Maid, MD Taking Active  Self  ondansetron (ZOFRAN-ODT) 8 MG disintegrating tablet [id number] Yes Take 1 tablet (8 mg total) by mouth every 8 (eight) hours as needed for refractory nausea / vomiting. Mikey Kirschner, PA-C Taking Active Self           Med Note Loann Quill   Tue Aug 11, 2021  3:04 PM)    pantoprazole (PROTONIX) 20 MG tablet [id number] Yes Take 1 tablet (20 mg total) by mouth 2 (two)  times daily. [name]., MD Taking Active Self  rosuvastatin (CRESTOR) 40 MG tablet [id number] Yes Take 1 tablet (40 mg total) by mouth daily. [name]., MD Taking Active Self  sacubitril-valsartan [facility]) 24-26 [place] [id number] Yes Take 1 tablet by mouth 2 (two) times daily. Alisa Graff, FNP Taking Active Self  sertraline (ZOLOFT) 100 MG tablet [id number] Yes Take 1 tablet (100 mg total) by mouth daily. [name]., MD Taking Active Self            Patient Active Problem List   Diagnosis Date Noted   History of vertigo 08/16/2021   Obesity (BMI 30-39.9) 08/14/2021   Positive blood culture 08/13/2021   Atrial fibrillation with RVR ([place]) 08/13/2021   Hypokalemia 08/12/2021   COVID-19 08/11/2021   Orthostatic hypotension 08/11/2021   Left hip pain 06/30/2021   Paroxysmal atrial fibrillation ([place]) 07/10/2020   History of coronary artery bypass graft 07/10/2020   Unsteady gait    Acute bronchitis 03/07/2019   Esophageal dysphagia    Stomach irritation    Gastric polyp    Esophageal lump    Hx of colonic polyp    Polyp of colon    Diverticulosis of large intestine without diverticulitis    PSVT (paroxysmal supraventricular tachycardia) ([place]) 10/03/2018   Abnormal ECG 07/05/2018   Tachycardia 03/27/2018   Pain in limb 02/28/2018   Chronic diastolic CHF (congestive heart failure) ([place]) 11/03/2017   TIA (transient ischemic attack) 11/03/2017   COPD suggested by initial evaluation ([place]) 08/23/2017   Postoperative state 08/15/2017   Incidental pulmonary nodule 08/01/2017   Non-ST elevation myocardial infarction (NSTEMI), subendocardial infarction, subsequent episode of care ([place]) 06/01/2017   B12 deficiency 12/01/2016   Vitamin D deficiency 11/04/2016   Depression with anxiety 09/30/2016   AF (paroxysmal atrial fibrillation) ([place]) 09/30/2016   Generalized weakness 09/30/2016   Resting tremor 09/30/2016   Mild obstructive sleep apnea  09/30/2016   Headache 08/18/2016   Unstable angina ([place]) 08/03/2016   Type 2 diabetes mellitus with diabetic polyneuropathy, with long-term current use of insulin ([place]) 08/03/2016   Chronic daily headache 05/05/2016   Asthma 11/11/2015   S/P CABG x 4 11/10/2015   Presence of aortocoronary bypass graft 11/10/2015   Coronary artery disease 11/07/2015   Carotid artery narrowing 02/08/2014   Chest pain 02/08/2014   Mixed hyperlipidemia 02/08/2014   Bilateral carotid artery stenosis 02/08/2014   Atypical chest pain 02/07/2014   Essential hypertension 02/07/2014   Awareness of heartbeats 02/07/2014   D (diarrhea) 10/05/2013   Gastroesophageal reflux disease 10/05/2013    Immunization History  Administered Date(s) Administered   Fluad Quad(high Dose 65+) 12/28/2018, 12/25/2020   Influenza, High Dose Seasonal PF 12/24/2016, 12/08/2017   Influenza,inj,Quad PF,6+ Mos 12/19/2015, 01/18/2020   Influenza-Unspecified 03/06/2015   Moderna Sars-Covid-2 Vaccination 04/14/2019, 05/12/2019, 01/28/2020   Pneumococcal Conjugate-13 03/16/2018   Pneumococcal Polysaccharide-23 12/24/2016   Tdap 07/28/2010   Zoster Recombinat (Shingrix) 06/14/2020    Conditions to be addressed/monitored:  Hypertension, Hyperlipidemia, Diabetes, Atrial  Fibrillation, Heart Failure, Coronary Artery Disease, GERD, Asthma, Depression and Anxiety  Care Plan : General Pharmacy (Adult)  Updates made by Germaine Pomfret, RPH since 09/14/2021 12:00 AM     Problem: Hypertension, Hyperlipidemia, Diabetes, Atrial Fibrillation, Heart Failure, Coronary Artery Disease, GERD, Asthma, Depression and Anxiety   Priority: High     Long-Range Goal: Patient-Specific Goal   Start Date: 06/06/2020  Expected End Date: 11/25/2021  This Visit's Progress: On track  Recent Progress: On track  Priority: High  Note:   Current Barriers:  Unable to achieve control of diabetes  Unable to achieve control of cholesterol Does not adhere to  prescribed medication regimen  Pharmacist Clinical Goal(s):  Over the next 90 days, patient will achieve control of diabetes as evidenced by A1c less than 8% patient will achieve control of cholesterol as evidenced by LDL less than 70 adhere to prescribed medication regimen as evidenced by utilization of enhanced pharmacy services through collaboration with PharmD and provider.   Interventions: 1:1 collaboration with [name]., MD regarding development and update of comprehensive plan of care as evidenced by provider attestation and co-signature Inter-disciplinary care team collaboration (see longitudinal plan of care) Comprehensive medication review performed; medication list updated in electronic medical record  Diabetes (A1c goal <8%) -Uncontrolled -Diagnosed 2010 -Current medications:  Trulicity 1.5 mg weekly on Thursdays: Appropriate, Query effective   Jardiance 25 mg daily: Appropriate, Query effective   Lantus 12 units nightly (0.24 u/kg): Appropriate, Query effective   -Medications previously tried: Glipizide, Lantus, Tradjenta, Metformin, Metformin XR, Ozempic   -Current home glucose readings: 320 post-prandial  -Denies hypoglycemic symptoms. -Nausea periodically throughout the week. -Current meal patterns: Light meals, mainly salads, yogurt.  -Current exercise: Less exercise due to the weather. Walking 3-4 times weekly (15-20 minutes)    -Continue current medications -Increase monitoring to 1-2x daily, recheck readings in one week.   Hyperlipidemia: (LDL goal < 55) -History of CABG x4 2017, 2018  -Uncontrolled -Current treatment: Rosuvastatin 40 mg daily: Appropriate, Query effective  Ezetimibe 10 mg daily: Appropriate, Query effective   -Current antiplatelet treatment: Clopidogrel 75 mg daily  -Medications previously tried: Atorvastatin (changed to rosuvastatin during hospitalization)  -Recommended to continue current medication -Recheck fasting lipid  panel at PCP follow-up.   Atrial Fibrillation (Goal: prevent stroke and major bleeding) -Controlled -CHADSVASC: 6 -Current treatment: Rate control: Metoprolol tartrate 100 mg twice daily Anticoagulation: Eliquis 5 mg twice daily -Medications previously tried: NA -Denies symptoms of A-fib -Recommended to continue current medication  Heart Failure (Goal: manage symptoms and prevent exacerbations) -Controlled -Last ejection fraction: 55-60% (Date: June 2021) -HF type: Diastolic -NYHA Class: III (marked limitation of activity) -AHA HF Stage: C (Heart disease and symptoms present) -Current treatment: Entresto 24-26 mg twice daily  Imdur 120 mg daily  Metoprolol tartrate 100 mg twice daily -Medications previously tried: Furosemide  -Current home BP/HR readings: 110-140s/60-70s  -Patient reports feeling lightheaded, chest pain. She did not bring her nitroglycerin with her to [place], [facility]  -Recommended to continue current medication  Depression/Anxiety (Goal: maintain symptom remission) -Uncontrolled -Current treatment: Sertraline 100 mg daily  -Medications previously tried/failed: NA -PHQ9: 0 -GAD7: 2 -Continue current medications  GERD with dysphagia (Goal: prevent heartburn or reflux symptoms) -Controlled -Current treatment  Pantoprazole 40 mg daily   -Medications previously tried: Sucralfate,  -Continue current medications  Patient Goals/Self-Care Activities Over the next 90 days, patient will:  - check glucose daily, before breakfast, document, and provide at  future appointments check blood pressure weekly, document, and provide at future appointments  Follow Up Plan: Telephone follow up appointment with care management team member scheduled for:  10/16/2021 at 8:30 AM    Medication Assistance: None required.  Patient affirms current coverage meets needs.  Patient's preferred pharmacy is:  [facility] - [place], [place] - [address] [place] [address] Phone: [phone] Fax: [phone]  [id number] [place] [address]Phone: [phone]: [phone] [id number] [place] [address] [place]: [phone]: [phone] decided to: Utilize [facility] for medication synchronization, packaging and delivery   Care Plan and Follow Up Patient Decision:  Patient agrees to Care Plan and Follow-up.  Plan: Telephone follow up appointment with care management team member scheduled for:  10/16/2021 at 8:30 AM  AlJunius ArgylePharmD, BCPara MarchCPLa [phone]

## 2021-09-18 ENCOUNTER — Other Ambulatory Visit: Payer: Self-pay | Admitting: Family Medicine

## 2021-09-18 DIAGNOSIS — E1142 Type 2 diabetes mellitus with diabetic polyneuropathy: Secondary | ICD-10-CM

## 2021-09-21 ENCOUNTER — Telehealth: Payer: Self-pay

## 2021-09-21 NOTE — Progress Notes (Signed)
Per Clinical Pharmacist, Check with patient and review all glucose readings since CPP visit.  Recent Relevant Labs: Lab Results  Component Value Date/Time   HGBA1C 8.2 (H) 07/30/2021 02:45 PM   HGBA1C 8.5 (H) 03/26/2021 09:07 AM   HGBA1C 9.2 07/10/2019 12:00 AM   HGBA1C 8.3 (H) 07/14/2013 05:42 AM   MICROALBUR 50 08/11/2020 08:51 AM    Kidney Function Lab Results  Component Value Date/Time   CREATININE 0.94 09/07/2021 04:32 PM   CREATININE 0.59 08/18/2021 04:35 AM   CREATININE 1.39 (H) 03/24/2017 04:38 PM   CREATININE 0.78 04/29/2014 08:42 PM   CREATININE 0.79 02/03/2014 04:06 PM   GFRNONAA >60 08/18/2021 04:35 AM   GFRNONAA >60 04/29/2014 08:42 PM   GFRNONAA >60 09/16/2013 05:23 AM   GFRAA 108 02/06/2020 09:49 AM   GFRAA >60 04/29/2014 08:42 PM   GFRAA >60 09/16/2013 05:23 AM    Current antihyperglycemic regimen:  Trulicity 1.5 mg weekly on Thursdays Jardiance 25 mg daily Lantus 12 units nightly  How often are you checking your blood sugar? once daily What are your blood sugars ranging?   Patient reports her blood sugar was higher then normal for a couple of days due to her not taking Trulicity and Lantus,but reports she has started back taking her Trulicity and Lantus.Patient states she was out of her medications at the time, and her phone was not working correctly.Patient reports she once started back taking her Trulicity and Lantus her blood sugar improved. Notified Clinical pharmacist.   On 10/01/2021 it was 157. On 09/30/2021 it was 197. On 09/29/2021 it was 320. On 09/28/2021 it was 300. On 09/27/2021 it was 215. On 09/26/2021 it was 187. On 09/25/2021 it was 167. On 09/24/2021 it was 147.   Patient states she needs her Imdur refill and deliver on 10/02/2021 as her Cardiology decrease her dose to 60 mg.Patient states she prefers not to cut her tablet in half that she has on hand. I completed acute fill for IMDUR 60 mg to be deliver on 10/02/2021, and sent to  [facility] for processing.Patient denies any needs of refills for her other medications as she reports she has plenty on hand.  [place] Pharmacist Assistant [phone]

## 2021-09-23 ENCOUNTER — Ambulatory Visit: Payer: Self-pay

## 2021-09-23 NOTE — Telephone Encounter (Signed)
     Chief Complaint: Lower abdominal pain Symptoms: Radiates into back Frequency: 2 weeks ago Pertinent Negatives: Patient denies diarrhea Disposition: '[]'$ ED /'[]'$ Urgent Care (no appt availability in office) / '[x]'$ Appointment(In office/virtual)/ '[]'$  [place] [facility]/ '[]'$ Home Care/ '[]'$ Refused Recommended Disposition /'[]'$ [facility] [facility]/ '[]'$  Follow-up with PCP Additional Notes: Requested appointment for next week. Instructed to call back for worsening of symptoms.  Reason for Disposition  Abdominal pain is a chronic symptom (recurrent or ongoing AND present > 4 weeks)  Answer Assessment - Initial Assessment Questions 1. LOCATION: "Where does it hurt?"      Lower  2. RADIATION: "Does the pain shoot anywhere else?" (e.g., chest, back)     Back 3. ONSET: "When did the pain begin?" (e.g., minutes, hours or days ago)      2 weeks ago 4. SUDDEN: "Gradual or sudden onset?"     Gradual 5. PATTERN "Does the pain come and go, or is it constant?"    - If constant: "Is it getting better, staying the same, or worsening?"      (Note: Constant means the pain never goes away completely; most serious pain is constant and it progresses)     - If intermittent: "How long does it last?" "Do you have pain now?"     (Note: Intermittent means the pain goes away completely between bouts)     Comes and goes 6. SEVERITY: "How bad is the pain?"  (e.g., Scale 1-10; mild, moderate, or severe)   - MILD (1-3): doesn't interfere with normal activities, abdomen soft and not tender to touch    - MODERATE (4-7): interferes with normal activities or awakens from sleep, abdomen tender to touch    - SEVERE (8-10): excruciating pain, doubled over, unable to do any normal activities      Now - 7 7. RECURRENT SYMPTOM: "Have you ever had this type of stomach pain before?" If Yes, ask: "When was the last time?" and "What happened that time?"      No 8. CAUSE: "What do you think is causing the stomach pain?"      Unsure 9. RELIEVING/AGGRAVATING FACTORS: "What makes it better or worse?" (e.g., movement, antacids, bowel movement)     Movement 10. OTHER SYMPTOMS: "Do you have any other symptoms?" (e.g., back pain, diarrhea, fever, urination pain, vomiting)       Nausea 11. PREGNANCY: "Is there any chance you are pregnant?" "When was your last menstrual period?"       No  Protocols used: Abdominal Pain - [facility]

## 2021-09-25 DIAGNOSIS — I5030 Unspecified diastolic (congestive) heart failure: Secondary | ICD-10-CM | POA: Diagnosis not present

## 2021-09-25 DIAGNOSIS — E785 Hyperlipidemia, unspecified: Secondary | ICD-10-CM | POA: Diagnosis not present

## 2021-09-25 DIAGNOSIS — I4891 Unspecified atrial fibrillation: Secondary | ICD-10-CM

## 2021-09-25 DIAGNOSIS — Z794 Long term (current) use of insulin: Secondary | ICD-10-CM

## 2021-09-25 DIAGNOSIS — Z7984 Long term (current) use of oral hypoglycemic drugs: Secondary | ICD-10-CM

## 2021-09-25 DIAGNOSIS — E1159 Type 2 diabetes mellitus with other circulatory complications: Secondary | ICD-10-CM

## 2021-09-29 NOTE — Progress Notes (Unsigned)
Cardiology Office Note  Date:  09/30/2021   ID:  [name], [name] 1950-06-07, MRN [id number]  PCP:  [name]., MD   Chief Complaint  Patient presents with   New Patient (Initial Visit)    Ref by Dr. Rosanna Randy post Covid with having chest pain. Patient c/o A-fib spells at times, chest tightness and shortness of breath.  Medications reviewed by the patient verbally.     HPI:  [name] is a 71 year old woman with past medical history of obesity Coronary artery disease, CABG 2017 at [facility] PCI to SVG to OM, stent placed PAD Hypertension Hyperlipidemia Diabetes type 2 Atrial fibrillation with RVR, paroxysmal COVID-14 Aug 2021 Chronic angina, SOB Who presents by referral from Dr. Rosanna Randy for chronic chest pain  Previously followed at [facility] She presents today for further discussion concerning her coronary artery disease Presents with her daughter Daughter concerned that she has frequent hospital admissions with cardiac etiology, in particular concerned about underlying coronary disease and whether further intervention is needed  Recent history reviewed Hospital admission May 2023 for COVID, treated with Paxlovid Entresto held for low blood pressure developed atrial fibrillation with [facility] records reviewed in detail Was discharged to SNF On discharge placed on cardiac medications as detailed : Eliquis 5 twice daily, Jardiance 25 daily, Zetia 10 daily Isosorbide 120 mg daily, metoprolol tartrate 100 twice daily Crestor 40, Entresto 24/26 twice daily -EKG showing converted back to sinus rhythm Aug 19, 2021  Echocardiogram April 2023 Ejection fraction 55 to 60%  Has completed cardiac rehab x 3 over the past few years SOB started one month later, sedentary at baseline  On further discussion of her medications, reports that she is Off imdur past few weeks, was having orthostasis type symptoms  Continues to have significant shortness of breath on  exertion  EKG personally reviewed by myself on todays visit Normal sinus rhythm with rate 87 bpm left bundle branch block  Periodically goes to [place] to see family, on 1 episode April 2022 she had chest pain went to the hospital had cardiac catheterization as below Records reviewed /full report in care everywhere Cardiac cath 06/2020 , [facility], [place] 1. Aggressive secondary prevention.  2. Follow up with primary cardiologist.  3. Can consider PCI of LAD in the future if symptoms are not controlled  with medical management   Additional studies Last catheterization June 2021, medical management was recommended Grafts x3 were patent LM lesion is 30% stenosed. Ost LM to LM lesion is 30% stenosed. Ost LM lesion is 40% stenosed. Mid LAD-1 lesion is 40% stenosed. Mid LAD-2 lesion is 75% stenosed. Ost LAD to Prox LAD lesion is 55% stenosed. Ost Cx to Mid Cx lesion is 80% stenosed. Ost 1st Mrg to 1st Mrg lesion is 75% stenosed. Mid RCA lesion is 40% stenosed. SVG patent and is small. Non-stenotic Dist Graft to Insertion lesion was previously treated. SVG patent and is small. LIMA patent and is small.       PMH:   has a past medical history of Allergy, Anemia, Anxiety, Arrhythmia, Arthritis, Atrial fibrillation (HCC), CHF (congestive heart failure) ([place]), COPD (chronic obstructive pulmonary disease) ([place]), Coronary artery disease, Depression, Diabetes mellitus without complication ([place]), Dyspnea, Dysrhythmia, GERD (gastroesophageal reflux disease), Headache, History of hiatal hernia, Hyperlipidemia, Hypertension, Myocardial infarction [facility]), Myocardial infarction with cardiac rehabilitation [facility]), Panic attack, Pneumonia, Reflux, Stroke ([place]) (2015), TIA (transient ischemic attack), and Voice tremor.  PSH:    Past Surgical History:  Procedure  Laterality Date   ABDOMINAL HYSTERECTOMY     APPENDECTOMY  1975   ARTERY BIOPSY Right 04/26/2016   Procedure: BIOPSY  TEMPORAL ARTERY;  Surgeon: Margaretha Sheffield, MD;  Location: [facility] ORS;  Service: ENT;  Laterality: Right;   CARDIAC CATHETERIZATION N/A 11/06/2015   Procedure: Left Heart Cath and Coronary Angiography;  Surgeon: Corey Skains, MD;  Location: [place] CV LAB;  Service: Cardiovascular;  Laterality: N/A;   CESAREAN SECTION     COLONOSCOPY  2015   COLONOSCOPY WITH PROPOFOL N/A 11/03/2018   Procedure: COLONOSCOPY WITH PROPOFOL;  Surgeon: Virgel Manifold, MD;  Location: [facility] ENDOSCOPY;  Service: Endoscopy;  Laterality: N/A;   CORONARY ANGIOPLASTY  04/2017   [place]   CORONARY ARTERY BYPASS GRAFT N/A 11/10/2015   Procedure: CORONARY ARTERY BYPASS GRAFTING (CABG), ON PUMP, TIMES FOUR, USING LEFT INTERNAL MAMMARY ARTERY, BILATERAL GREATER SAPHENOUS VEINS HARVESTED ENDOSCOPICALLY;  Surgeon: [name], MD;  Location: [place];  Service: Open Heart Surgery;  Laterality: N/A;  LIMA-LAD; SEQ SVG-OM1-OM2; SVG-PL   CORONARY STENT INTERVENTION N/A 08/05/2016   Procedure: Coronary Stent Intervention;  Surgeon: Isaias Cowman, MD;  Location: [place] [facility];  Service: Cardiovascular;  Laterality: N/A;   ESOPHAGOGASTRODUODENOSCOPY (EGD) WITH PROPOFOL N/A 11/03/2018   Procedure: ESOPHAGOGASTRODUODENOSCOPY (EGD) WITH PROPOFOL;  Surgeon: Virgel Manifold, MD;  Location: [facility] END[facility];  Service: Endoscopy;  Laterality: N/A;   HYSTERECTOMY ABDOMINAL WITH SALPINGO-OOPHORECTOMY Bilateral 08/15/2017   Procedure: HYSTERECTOMY ABDOMINAL WITH BILATERAL SALPINGO-OOPHORECTOMY;  Surgeon: Rubie Maid, MD;  Location: [facility];  Service: Gynecology;  Laterality: Bilateral;   LEFT HEART CATH AND CORONARY ANGIOGRAPHY N/A 08/05/2016   Procedure: Left Heart Cath and Coronary Angiography;  Surgeon: Isaias Cowman, MD;  Location: [place] [facility];  Service: Cardiovascular;  Laterality: N/A;   LEFT HEART CATH AND CORS/GRAFTS ANGIOGRAPHY N/A 09/19/2018   Procedure: LEFT HEART CATH AND  CORS/GRAFTS ANGIOGRAPHY;  Surgeon: Corey Skains, MD;  Location: [place] [facility];  Service: Cardiovascular;  Laterality: N/A;   LEFT HEART CATH AND CORS/GRAFTS ANGIOGRAPHY N/A 08/30/2019   Procedure: LEFT HEART CATH AND CORS/GRAFTS ANGIOGRAPHY;  Surgeon: Teodoro Spray, MD;  Location: [place] [facility];  Service: Cardiovascular;  Laterality: N/A;   OOPHORECTOMY     TEE WITHOUT CARDIOVERSION N/A 11/10/2015   Procedure: TRANSESOPHAGEAL ECHOCARDIOGRAM (TEE);  Surgeon: [name], MD;  Location: [place];  Service: Open Heart Surgery;  Laterality: N/A;   TUBAL LIGATION      Current Outpatient Medications  Medication Sig Dispense Refill   acetaminophen (TYLENOL) 325 MG tablet Take 650 mg by mouth every 6 (six) hours as needed for moderate pain or headache.      albuterol (VENTOLIN HFA) 108 (90 Base) MCG/ACT inhaler Inhale 2 puffs into the lungs every 6 (six) hours as needed for wheezing or shortness of breath. 8 g 2   ALPRAZolam (XANAX) 0.25 MG tablet Take 1 tablet (0.25 mg total) by mouth every 8 (eight) hours as needed for anxiety. 90 tablet 5   clopidogrel (PLAVIX) 75 MG tablet Take 1 tablet (75 mg total) by mouth daily. 90 tablet 0   ELIQUIS 5 MG TABS tablet Take 5 mg by mouth 2 (two) times daily.     empagliflozin (JARDIANCE) 25 MG TABS tablet Take 1 tablet (25 mg total) by mouth daily. 90 tablet 1   ezetimibe (ZETIA) 10 MG tablet Take 1 tablet (10 mg total) by mouth daily. 90 tablet 0   insulin glargine (LANTUS SOLOSTAR)  100 UNIT/ML Solostar Pen Inject 12 Units into the skin at bedtime. 18 mL 3   isosorbide mononitrate (IMDUR) 120 MG 24 hr tablet Take 1 tablet (120 mg total) by mouth daily. 90 tablet 1   Lancets (ONETOUCH DELICA PLUS [id number]) MISC USE UP TO 4 TIMES DAILY AS DIRECTED 100 each 0   loperamide (IMODIUM) 2 MG capsule Take 2 mg by mouth daily as needed for diarrhea or loose stools.     metoprolol tartrate (LOPRESSOR) 100 MG tablet Take 100 mg by mouth 2 (two) times  daily.     nitroGLYCERIN (NITROSTAT) 0.4 MG SL tablet Place 1 tablet (0.4 mg total) under the tongue every 5 (five) minutes as needed for chest pain. 30 tablet 0   ondansetron (ZOFRAN-ODT) 8 MG disintegrating tablet Take 1 tablet (8 mg total) by mouth every 8 (eight) hours as needed for refractory nausea / vomiting. 30 tablet 1   pantoprazole (PROTONIX) 20 MG tablet Take 1 tablet (20 mg total) by mouth 2 (two) times daily. 60 tablet 11   rosuvastatin (CRESTOR) 40 MG tablet Take 1 tablet (40 mg total) by mouth daily. 90 tablet 1   sacubitril-valsartan (ENTRESTO) 24-26 MG Take 1 tablet by mouth 2 (two) times daily. 60 tablet 3   sertraline (ZOLOFT) 100 MG tablet Take 1 tablet (100 mg total) by mouth daily. 90 tablet 1   TRULICITY 1.5 KX/3.8HW SOPN INJECT 1.5 MG into THE SKIN ONCE A WEEK 2 mL 0   Blood Glucose Monitoring Suppl (ONETOUCH VERIO) w/Device KIT Use daily to check blood sugar (Patient not taking: Reported on 09/30/2021) 1 kit 0   COMFORT EZ PEN NEEDLES 32G X 4 MM MISC Use to inject insulin daily (Patient not taking: Reported on 09/30/2021) 100 each 1   glucose blood test strip Use to check blood sugars daily as instructed (Patient not taking: Reported on 09/30/2021) 100 each 12   nystatin (NYSTATIN) powder Apply 1 application topically daily. (Patient not taking: Reported on 09/30/2021) 60 g 5   No current facility-administered medications for this visit.     Allergies:   Tramadol, Lisinopril, and Penicillins   Social History:  The patient  reports that she quit smoking about 19 years ago. Her smoking use included cigarettes. She smoked an average of .5 packs per day. She has never used smokeless tobacco. She reports that she does not drink alcohol and does not use drugs.   Family History:   family history includes Asthma in her father; Breast cancer (age of onset: 57) in her sister; Breast cancer (age of onset: 74) in her sister; Cancer in her father, mother, sister, and sister; Heart disease  in her father; Hypertension in her father and mother; Lung cancer in her brother; Pancreatic cancer (age of onset: 78) in her mother; Pancreatic cancer (age of onset: 52) in her sister.    Review of Systems: Review of Systems  Constitutional: Negative.   HENT: Negative.    Respiratory:  Positive for shortness of breath.   Cardiovascular:  Positive for chest pain.  Gastrointestinal: Negative.   Musculoskeletal: Negative.   Neurological: Negative.   Psychiatric/Behavioral: Negative.    All other systems reviewed and are negative.    PHYSICAL EXAM: VS:  BP 110/60 (BP Location: Right Arm, Patient Position: Sitting, Cuff Size: Normal)   Pulse 87   Ht '5\' 2"'  (1.575 m)   Wt 171 lb (77.6 kg)   SpO2 98%   BMI 31.28 kg/m  , BMI Body mass index  is 31.28 kg/m. GEN: Well nourished, well developed, in no acute distress HEENT: normal Neck: no JVD, carotid bruits, or masses Cardiac: RRR; no murmurs, rubs, or gallops,no edema  Respiratory:  clear to auscultation bilaterally, normal work of breathing GI: soft, nontender, nondistended, + BS MS: no deformity or atrophy Skin: warm and dry, no rash Neuro:  Strength and sensation are intact Psych: euthymic mood, full affect   Recent Labs: 06/10/2021: NT-Pro BNP 189 08/15/2021: B Natriuretic Peptide 82.4 08/16/2021: Magnesium 2.0 09/07/2021: ALT 38; BUN 14; Creatinine, Ser 0.94; Hemoglobin 12.2; Platelets 186; Potassium 4.0; Sodium 140; TSH 1.560    Lipid Panel Lab Results  Component Value Date   CHOL 164 08/11/2020   HDL 57 08/11/2020   Jones 90 08/11/2020   TRIG 94 08/11/2020      Wt Readings from Last 3 Encounters:  09/30/21 171 lb (77.6 kg)  08/22/21 180 lb 12.4 oz (82 kg)  08/10/21 171 lb (77.6 kg)     ASSESSMENT AND PLAN:  Problem List Items Addressed This Visit       Cardiology Problems   Essential hypertension   Coronary artery disease - Primary   AF (paroxysmal atrial fibrillation) (HCC)   Bilateral carotid artery  stenosis   PSVT (paroxysmal supraventricular tachycardia) ([place])   Mixed hyperlipidemia   Other Visit Diagnoses     Hx of CABG          Coronary artery disease with stable angina History of CABG Catheterization 2021 and 2022 It was suggested if she had refractory anginal symptoms she could consider PCI to the LAD Images/catheterization films have been requested from [place] for our review She also goes to visit [place] to see family, scheduled to go next week, recommend she try to obtain images on the CD for our review -We will show the images to one of our interventional cardiologist to see if PCI is possible on the LAD She reports she is not on her isosorbide as blood pressure was low and she was having orthostasis symptoms -Recommend she decrease the dose of isosorbide down to 60 mg daily restart this in the morning Decrease Entresto down to 1 pill once a day in the evening, monitor blood pressure.  If needed it could hold the [facility] She prefers not to decrease dose of metoprolol given history of paroxysmal atrial fibrillation  PAF/paroxysmal SVT Continue metoprolol 100 twice daily On Eliquis, maintaining normal sinus rhythm  Hyperlipidemia Continue Crestor 40 with Zetia Goal LDL less than 70 Weight loss recommended  Essential hypertension Medication changes as above, restart of isosorbide, decrease dose of Entresto   Total encounter time more than 60 minutes  Greater than 50% was spent in counseling and coordination of care with the patient    Signed, Esmond Plants, M.D., Ph.D. [place], [place]

## 2021-09-30 ENCOUNTER — Ambulatory Visit (INDEPENDENT_AMBULATORY_CARE_PROVIDER_SITE_OTHER): Payer: 59 | Admitting: Family Medicine

## 2021-09-30 ENCOUNTER — Ambulatory Visit
Admission: RE | Admit: 2021-09-30 | Discharge: 2021-09-30 | Disposition: A | Discharge status: Home / Self Care | Payer: 59 | Attending: Family Medicine | Admitting: Family Medicine

## 2021-09-30 ENCOUNTER — Encounter: Payer: Self-pay | Admitting: Family Medicine

## 2021-09-30 ENCOUNTER — Telehealth: Payer: Self-pay

## 2021-09-30 ENCOUNTER — Encounter: Payer: Self-pay | Admitting: Cardiovascular Disease

## 2021-09-30 ENCOUNTER — Ambulatory Visit
Admission: RE | Admit: 2021-09-30 | Discharge: 2021-09-30 | Disposition: A | Discharge status: Home / Self Care | Payer: 59 | Source: Ambulatory Visit | Attending: Family Medicine | Admitting: Family Medicine

## 2021-09-30 ENCOUNTER — Ambulatory Visit (INDEPENDENT_AMBULATORY_CARE_PROVIDER_SITE_OTHER): Payer: 59 | Admitting: Cardiovascular Disease

## 2021-09-30 VITALS — BP 100/53 | HR 82 | Temp 98.2°F | Resp 16 | Wt 171.0 lb

## 2021-09-30 VITALS — BP 110/60 | HR 87 | Ht 62.0 in | Wt 171.0 lb

## 2021-09-30 DIAGNOSIS — M79606 Pain in leg, unspecified: Secondary | ICD-10-CM | POA: Diagnosis present

## 2021-09-30 DIAGNOSIS — M545 Low back pain, unspecified: Secondary | ICD-10-CM | POA: Diagnosis present

## 2021-09-30 DIAGNOSIS — E1142 Type 2 diabetes mellitus with diabetic polyneuropathy: Secondary | ICD-10-CM | POA: Diagnosis not present

## 2021-09-30 DIAGNOSIS — K219 Gastro-esophageal reflux disease without esophagitis: Secondary | ICD-10-CM

## 2021-09-30 DIAGNOSIS — M25551 Pain in right hip: Secondary | ICD-10-CM | POA: Diagnosis present

## 2021-09-30 DIAGNOSIS — Z951 Presence of aortocoronary bypass graft: Secondary | ICD-10-CM

## 2021-09-30 DIAGNOSIS — M25552 Pain in left hip: Secondary | ICD-10-CM

## 2021-09-30 DIAGNOSIS — I152 Hypertension secondary to endocrine disorders: Secondary | ICD-10-CM

## 2021-09-30 DIAGNOSIS — Z794 Long term (current) use of insulin: Secondary | ICD-10-CM

## 2021-09-30 DIAGNOSIS — I10 Essential (primary) hypertension: Secondary | ICD-10-CM

## 2021-09-30 DIAGNOSIS — I25118 Atherosclerotic heart disease of native coronary artery with other forms of angina pectoris: Secondary | ICD-10-CM | POA: Diagnosis not present

## 2021-09-30 DIAGNOSIS — I480 Paroxysmal atrial fibrillation: Secondary | ICD-10-CM

## 2021-09-30 DIAGNOSIS — E782 Mixed hyperlipidemia: Secondary | ICD-10-CM | POA: Diagnosis not present

## 2021-09-30 DIAGNOSIS — I6523 Occlusion and stenosis of bilateral carotid arteries: Secondary | ICD-10-CM

## 2021-09-30 DIAGNOSIS — I471 Supraventricular tachycardia: Secondary | ICD-10-CM

## 2021-09-30 DIAGNOSIS — K5730 Diverticulosis of large intestine without perforation or abscess without bleeding: Secondary | ICD-10-CM

## 2021-09-30 DIAGNOSIS — E1159 Type 2 diabetes mellitus with other circulatory complications: Secondary | ICD-10-CM

## 2021-09-30 MED ORDER — SACUBITRIL-VALSARTAN 24-26 MG PO TABS: 1.0000 | ORAL_TABLET | Freq: Every day | ORAL | 3 refills | Status: DC

## 2021-09-30 MED ORDER — KETOROLAC TROMETHAMINE 60 MG/2ML IM SOLN
60.0000 mg | Freq: Once | INTRAMUSCULAR | Status: AC
  Administered 2021-09-30: 30 mg via INTRAMUSCULAR

## 2021-09-30 MED ORDER — ISOSORBIDE MONONITRATE ER 60 MG PO TB24: 60.0000 mg | ORAL_TABLET | Freq: Every day | ORAL | 3 refills | Status: DC

## 2021-09-30 NOTE — Telephone Encounter (Signed)
-----   Message from [name]., MD sent at 09/30/2021  2:17 PM EDT ----- No fracture identified.  Severe arthritis and degenerative disc disease.  She is also very constipated.  Try to limit the narcotics for the pain and try MiraLAX daily for the constipation. It  May help the lower abdominal pain please advise patient.

## 2021-09-30 NOTE — Telephone Encounter (Signed)
Patient advised as below. Patient would like to know what can she do for the arthritis and DDD. Please advise.

## 2021-09-30 NOTE — Patient Instructions (Addendum)
We will request the cath images from [place] from 2022   Medication Instructions:  Please change isosorbide mononitrate to 60 mg daily in the Am Decrease entresto down to one a day in the Pm  If you need a refill on your cardiac medications before your next appointment, please call your pharmacy.   Lab work: No new labs needed  Testing/Procedures: No new testing needed  Follow-Up: At [facility], you and your health needs are our priority.  As part of our continuing mission to provide you with exceptional heart care, we have created designated Provider Care Teams.  These Care Teams include your primary Cardiologist (physician) and Advanced Practice Providers (APPs -  Physician Assistants and Nurse Practitioners) who all work together to provide you with the care you need, when you need it.  You will need a follow up appointment in 3 months  Providers on your designated Care Team:   Murray Hodgkins, NP Christell Faith, PA-C Cadence Kathlen Mody, [place]  COVID-19 Vaccine Information can be found at: ShippingScam.co.uk For questions related to vaccine distribution or appointments, please email [email or web address] or call [phone].

## 2021-09-30 NOTE — Progress Notes (Unsigned)
I,Sulibeya S Dimas,acting as a scribe for [name], MD.,have documented all relevant documentation on the behalf of [name], MD,as directed by  [name], MD while in the presence of [name], MD.   Established patient visit   Patient: Candace [name]   DOB: [birth year]   71 y.o. Female  MRN: [id number] Visit Date: 09/30/2021  Today's healthcare provider: [name], MD   Chief Complaint  Patient presents with   Abdominal Pain   Subjective    Abdominal Pain This is a new problem. The current episode started 1 to 4 weeks ago. The onset quality is gradual. The problem occurs intermittently. The problem has been unchanged. The pain is located in the epigastric region and periumbilical region. The pain is mild. The quality of the pain is burning. The abdominal pain radiates to the back. Associated symptoms include constipation and nausea. Pertinent negatives include no dysuria, fever, frequency or vomiting. Exacerbated by: walking. The pain is relieved by Being still.      Medications: Outpatient Medications Prior to Visit  Medication Sig   acetaminophen (TYLENOL) 325 MG tablet Take 650 mg by mouth every 6 (six) hours as needed for moderate pain or headache.    albuterol (VENTOLIN HFA) 108 (90 Base) MCG/ACT inhaler Inhale 2 puffs into the lungs every 6 (six) hours as needed for wheezing or shortness of breath.   ALPRAZolam (XANAX) 0.25 MG tablet Take 1 tablet (0.25 mg total) by mouth every 8 (eight) hours as needed for anxiety.   Blood Glucose Monitoring Suppl (ONETOUCH VERIO) w/Device KIT Use daily to check blood sugar   clopidogrel (PLAVIX) 75 MG tablet Take 1 tablet (75 mg total) by mouth daily.   COMFORT EZ PEN NEEDLES 32G X 4 MM MISC Use to inject insulin daily   ELIQUIS 5 MG TABS tablet Take 5 mg by mouth 2 (two) times daily.   empagliflozin (JARDIANCE) 25 MG TABS tablet Take 1 tablet (25 mg total) by mouth daily.   ezetimibe  (ZETIA) 10 MG tablet Take 1 tablet (10 mg total) by mouth daily.   glucose blood test strip Use to check blood sugars daily as instructed   insulin glargine (LANTUS SOLOSTAR) 100 UNIT/ML Solostar Pen Inject 12 Units into the skin at bedtime.   isosorbide mononitrate (IMDUR) 60 MG 24 hr tablet Take 1 tablet (60 mg total) by mouth daily.   Lancets (ONETOUCH DELICA PLUS [id number]) MISC USE UP TO 4 TIMES DAILY AS DIRECTED   loperamide (IMODIUM) 2 MG capsule Take 2 mg by mouth daily as needed for diarrhea or loose stools.   metoprolol tartrate (LOPRESSOR) 100 MG tablet Take 100 mg by mouth 2 (two) times daily.   nitroGLYCERIN (NITROSTAT) 0.4 MG SL tablet Place 1 tablet (0.4 mg total) under the tongue every 5 (five) minutes as needed for chest pain.   nystatin (NYSTATIN) powder Apply 1 application topically daily. (Patient taking differently: Apply 1 application  topically as needed.)   ondansetron (ZOFRAN-ODT) 8 MG disintegrating tablet Take 1 tablet (8 mg total) by mouth every 8 (eight) hours as needed for refractory nausea / vomiting.   pantoprazole (PROTONIX) 20 MG tablet Take 1 tablet (20 mg total) by mouth 2 (two) times daily.   rosuvastatin (CRESTOR) 40 MG tablet Take 1 tablet (40 mg total) by mouth daily.   sacubitril-valsartan (ENTRESTO) 24-26 MG Take 1 tablet by mouth daily.   sertraline (ZOLOFT) 100 MG tablet Take 1 tablet (100  mg total) by mouth daily.   TRULICITY 1.5 TJ/0.3ES SOPN INJECT 1.5 MG into THE SKIN ONCE A WEEK   No facility-administered medications prior to visit.    Review of Systems  Constitutional:  Negative for chills and fever.  Respiratory:  Positive for shortness of breath.   Cardiovascular:  Positive for chest pain. Negative for leg swelling.  Gastrointestinal:  Positive for abdominal pain, constipation and nausea. Negative for blood in stool and vomiting.  Genitourinary:  Negative for dysuria and frequency.  Musculoskeletal:  Positive for back pain.    {Labs   Heme  Chem  Endocrine  Serology  Results Review (optional):[id number]}   Objective    BP (!) 100/53 (BP Location: Left Arm, Patient Position: Sitting, Cuff Size: Large)   Pulse 82   Temp 98.2 F (36.8 C) (Oral)   Resp 16   Wt 171 lb (77.6 kg)   SpO2 98%   BMI 31.28 kg/m  BP Readings from Last 3 Encounters:  09/30/21 (!) 100/53  09/30/21 110/60  09/07/21 102/60   Wt Readings from Last 3 Encounters:  09/30/21 171 lb (77.6 kg)  09/30/21 171 lb (77.6 kg)  08/22/21 180 lb 12.4 oz (82 kg)      Physical Exam  ***  No results found for any visits on 09/30/21.  Assessment & Plan     ***  No follow-ups on file.      {provider attestation***:1}   [name], MD  [facility] [phone] (phone) [phone] (fax)  [place]

## 2021-10-01 NOTE — Addendum Note (Signed)
Addended by: Anselm Pancoast on: 10/01/2021 12:08 PM   Modules accepted: Orders

## 2021-10-02 ENCOUNTER — Telehealth: Payer: Self-pay

## 2021-10-02 NOTE — Progress Notes (Signed)
Per Clinical Pharmacist, please ask Candace [name] to increase her Lantus to 14 units daily until her next follow-up with me.  Patient verbalized understanding, and agreed to increase her Lantus to 14 units daily.  [place] Pharmacist Assistant [phone]

## 2021-10-09 ENCOUNTER — Ambulatory Visit (INDEPENDENT_AMBULATORY_CARE_PROVIDER_SITE_OTHER): Payer: 59

## 2021-10-09 DIAGNOSIS — E1142 Type 2 diabetes mellitus with diabetic polyneuropathy: Secondary | ICD-10-CM

## 2021-10-09 DIAGNOSIS — I5032 Chronic diastolic (congestive) heart failure: Secondary | ICD-10-CM

## 2021-10-09 DIAGNOSIS — I200 Unstable angina: Secondary | ICD-10-CM

## 2021-10-09 DIAGNOSIS — I480 Paroxysmal atrial fibrillation: Secondary | ICD-10-CM

## 2021-10-09 NOTE — Chronic Care Management (AMB) (Signed)
Chronic Care Management   CCM RN Visit Note  10/09/2021 Name: Candace [name] MRN: [id number] DOB: [birth year]  Subjective: Candace [name] is a 71 y.o. year old female who is a primary care patient of Candace [name]., MD. The care management team was consulted for assistance with disease management and care coordination needs.    Engaged with patient by telephone for follow up visit in response to provider referral for case management and care coordination services.   Consent to Services:  The patient was given information about Chronic Care Management services, agreed to services, and gave verbal consent prior to initiation of services.  Please see initial visit note for detailed documentation.   Assessment: Review of patient past medical history, allergies, medications, health status, including review of consultants reports, laboratory and other test data, was performed as part of comprehensive evaluation and provision of chronic care management services.   SDOH (Social Determinants of Health) assessments and interventions performed: No  CCM Care Plan  Allergies  Allergen Reactions   Tramadol Other (See Comments)    Causes patient to be off balance and Mental Changes   Lisinopril Cough   Penicillins Swelling, Rash and Other (See Comments)    Did it involve swelling of the face/tongue/throat, SOB, or low BP? Yes Did it involve sudden or severe rash/hives, skin peeling, or any reaction on the inside of your mouth or nose? Yes Did you need to seek medical attention at a hospital or doctor's office? Yes When did it last happen?      15 years If all above answers are "NO", may proceed with cephalosporin use.     Outpatient Encounter Medications as of 10/09/2021  Medication Sig   acetaminophen (TYLENOL) 325 MG tablet Take 650 mg by mouth every 6 (six) hours as needed for moderate pain or headache.    albuterol (VENTOLIN HFA) 108 (90 Base) MCG/ACT inhaler Inhale 2 puffs into the  lungs every 6 (six) hours as needed for wheezing or shortness of breath.   ALPRAZolam (XANAX) 0.25 MG tablet Take 1 tablet (0.25 mg total) by mouth every 8 (eight) hours as needed for anxiety.   Blood Glucose Monitoring Suppl (ONETOUCH VERIO) w/Device KIT Use daily to check blood sugar   clopidogrel (PLAVIX) 75 MG tablet Take 1 tablet (75 mg total) by mouth daily.   COMFORT EZ PEN NEEDLES 32G X 4 MM MISC Use to inject insulin daily   ELIQUIS 5 MG TABS tablet Take 5 mg by mouth 2 (two) times daily.   empagliflozin (JARDIANCE) 25 MG TABS tablet Take 1 tablet (25 mg total) by mouth daily.   ezetimibe (ZETIA) 10 MG tablet Take 1 tablet (10 mg total) by mouth daily.   glucose blood test strip Use to check blood sugars daily as instructed   insulin glargine (LANTUS SOLOSTAR) 100 UNIT/ML Solostar Pen Inject 12 Units into the skin at bedtime.   isosorbide mononitrate (IMDUR) 60 MG 24 hr tablet Take 1 tablet (60 mg total) by mouth daily.   Lancets (ONETOUCH DELICA PLUS [id number]) MISC USE UP TO 4 TIMES DAILY AS DIRECTED   loperamide (IMODIUM) 2 MG capsule Take 2 mg by mouth daily as needed for diarrhea or loose stools.   metoprolol tartrate (LOPRESSOR) 100 MG tablet Take 100 mg by mouth 2 (two) times daily.   nitroGLYCERIN (NITROSTAT) 0.4 MG SL tablet Place 1 tablet (0.4 mg total) under the tongue every 5 (five) minutes as needed for chest pain.   nystatin (  NYSTATIN) powder Apply 1 application topically daily. (Patient taking differently: Apply 1 application  topically as needed.)   ondansetron (ZOFRAN-ODT) 8 MG disintegrating tablet Take 1 tablet (8 mg total) by mouth every 8 (eight) hours as needed for refractory nausea / vomiting.   pantoprazole (PROTONIX) 20 MG tablet Take 1 tablet (20 mg total) by mouth 2 (two) times daily.   rosuvastatin (CRESTOR) 40 MG tablet Take 1 tablet (40 mg total) by mouth daily.   sacubitril-valsartan (ENTRESTO) 24-26 MG Take 1 tablet by mouth daily.   sertraline (ZOLOFT)  100 MG tablet Take 1 tablet (100 mg total) by mouth daily.   TRULICITY 1.5 KZ/6.0FU SOPN INJECT 1.5 MG into THE SKIN ONCE A WEEK   [DISCONTINUED] cetirizine (ZYRTEC) 5 MG tablet Take 2 tablets (10 mg total) by mouth daily.   No facility-administered encounter medications on file as of 10/09/2021.    Patient Active Problem List   Diagnosis Date Noted   History of vertigo 08/16/2021   Obesity (BMI 30-39.9) 08/14/2021   Positive blood culture 08/13/2021   Atrial fibrillation with RVR ([place]) 08/13/2021   Hypokalemia 08/12/2021   COVID-19 08/11/2021   Orthostatic hypotension 08/11/2021   Left hip pain 06/30/2021   Paroxysmal atrial fibrillation ([place]) 07/10/2020   History of coronary artery bypass graft 07/10/2020   Unsteady gait    Acute bronchitis 03/07/2019   Esophageal dysphagia    Stomach irritation    Gastric polyp    Esophageal lump    Hx of colonic polyp    Polyp of colon    Diverticulosis of large intestine without diverticulitis    PSVT (paroxysmal supraventricular tachycardia) ([place]) 10/03/2018   Abnormal ECG 07/05/2018   Tachycardia 03/27/2018   Pain in limb 02/28/2018   Chronic diastolic CHF (congestive heart failure) ([place]) 11/03/2017   TIA (transient ischemic attack) 11/03/2017   COPD suggested by initial evaluation ([place]) 08/23/2017   Postoperative state 08/15/2017   Incidental pulmonary nodule 08/01/2017   Non-ST elevation myocardial infarction (NSTEMI), subendocardial infarction, subsequent episode of care ([place]) 06/01/2017   B12 deficiency 12/01/2016   Vitamin D deficiency 11/04/2016   Depression with anxiety 09/30/2016   AF (paroxysmal atrial fibrillation) ([place]) 09/30/2016   Generalized weakness 09/30/2016   Resting tremor 09/30/2016   Mild obstructive sleep apnea 09/30/2016   Headache 08/18/2016   Unstable angina ([place]) 08/03/2016   Type 2 diabetes mellitus with diabetic polyneuropathy, with long-term current use of insulin ([place]) 08/03/2016   Chronic daily  headache 05/05/2016   Asthma 11/11/2015   S/P CABG x 4 11/10/2015   Presence of aortocoronary bypass graft 11/10/2015   Coronary artery disease 11/07/2015   Carotid artery narrowing 02/08/2014   Chest pain 02/08/2014   Mixed hyperlipidemia 02/08/2014   Bilateral carotid artery stenosis 02/08/2014   Atypical chest pain 02/07/2014   Essential hypertension 02/07/2014   Awareness of heartbeats 02/07/2014   D (diarrhea) 10/05/2013   Gastroesophageal reflux disease 10/05/2013    Patient Care Plan: RN Care Management Plan of Care     Problem Identified: DM, HF, HTN, A-Fib and DM      Long-Range Goal: Disease Progression Prevented or Minimized   Priority: High  Note:   Current Barriers:  Chronic Disease Management support and education needs related to Atrial Fibrillation, CHF, HTN, and DMII  RNCM Clinical Goal(s):  Patient will continue to work with RN Care Manager to address care management and care coordination needs related to  Atrial Fibrillation, CHF, HTN, and DMII through collaboration with RN  Care manager, provider, and care team.   Interventions: 1:1 collaboration with primary care provider regarding development and update of comprehensive plan of care as evidenced by provider attestation and co-signature Inter-disciplinary care team collaboration (see longitudinal plan of care) Evaluation of current treatment plan related to  self management and patient's adherence to plan as established by provider   Hypertension Interventions: Reviewed plan for hypertension management. Continues taking medications as prescribed. Reviewed BP parameters. Advised to continue monitoring and maintain a log. Reviewed indications for notifying a provider.  Reviewed complications of uncontrolled blood pressure.  Reviewed s/sx of heart attack, stroke and worsening symptoms that require immediate medical attention.   Diabetes Interventions:  Reviewed plan for diabetes management. Reports having  needed prescriptions and taking medications as prescribed. Reviewed blood glucose readings. Reports one high reading in the 300's. Reports visiting with her granddaughter and not having all of her prescriptions at the time. Reports overall her readings have improved. Denies low readings. Reports fasting reading today was 139 mg/dl. Reports doing well with self management. She remains motivated to improve overall glycemic control. Will continue working with the CCM Pharmacist and Endocrinology team regarding medication adjustments.    Cardiology/Heart Health Interventions: Discussed current treatment plan for cardiac illnesses (CHF and A-Fib). Reports compliance with treatment plans. She continues to experience intermittent symptoms r/t angina but overall reports doing well. Reports her activity tolerance has declined but notes that the changes were d/t deconditioning during hospitalization and rehab r/t Covid.  Patient is currently visiting with her granddaughter. Continues to receive excellent family support. Notes they remain available for transportation and assistance with in-home tasks as needed. Patient remains a high risk d/t significant cardiac history. Arrangements were previously made for patient to be evaluated by a Cardiologist in [place] area in the event assistance was required while visiting her granddaughter. Thorough discussion regarding symptoms that require immediate medical attention.    Patient Goals/Self-Care Activities: Patient will self administer medications as prescribed Patient will attend all scheduled provider appointments Patient will call pharmacy for medication refills Patient will continue to perform ADL's independently Patient will continue to perform IADL's independently Patient will call provider office for new concerns or questions          PLAN Ms. Crager will call for assistance if needed. The care team will gladly assist.   [facility] Care Management [phone]

## 2021-10-12 ENCOUNTER — Telehealth: Payer: Self-pay | Admitting: Cardiovascular Disease

## 2021-10-12 NOTE — Telephone Encounter (Signed)
Patient family member dropped off imaging notes from [facility] to be reviewed Placed in nurse box

## 2021-10-12 NOTE — Telephone Encounter (Signed)
Notes placed on MDs desk for review.

## 2021-10-13 ENCOUNTER — Telehealth: Payer: Self-pay

## 2021-10-13 NOTE — Progress Notes (Signed)
Chronic Care Management Pharmacy Assistant   Name: Candace [name]  MRN: [id number] DOB: [birth year]  Reason for Encounter: Medication Review/Medication Coordination Call.   Recent office visits:  10/09/2021 Neldon Labella RN (CCM ) No Medication Changes noted 09/30/2021 Dr. Rosanna Randy MD (PCP) No Medication Changes noted  Recent consult visits:  09/30/2021 Dr. Rockey Situ MD (Cardiology) Decrease isosorbide to 60 mg in the morning, Decrease Entresto down to 1 pill once a day in the evening  Hospital visits:  None in previous 6 months  Medications: Outpatient Encounter Medications as of 10/13/2021  Medication Sig Note   acetaminophen (TYLENOL) 325 MG tablet Take 650 mg by mouth every 6 (six) hours as needed for moderate pain or headache.     albuterol (VENTOLIN HFA) 108 (90 Base) MCG/ACT inhaler Inhale 2 puffs into the lungs every 6 (six) hours as needed for wheezing or shortness of breath.    ALPRAZolam (XANAX) 0.25 MG tablet Take 1 tablet (0.25 mg total) by mouth every 8 (eight) hours as needed for anxiety.    Blood Glucose Monitoring Suppl (ONETOUCH VERIO) w/Device KIT Use daily to check blood sugar    clopidogrel (PLAVIX) 75 MG tablet Take 1 tablet (75 mg total) by mouth daily.    COMFORT EZ PEN NEEDLES 32G X 4 MM MISC Use to inject insulin daily    ELIQUIS 5 MG TABS tablet Take 5 mg by mouth 2 (two) times daily. 10/09/2021: Reports dose was changed to once a day.   empagliflozin (JARDIANCE) 25 MG TABS tablet Take 1 tablet (25 mg total) by mouth daily.    ezetimibe (ZETIA) 10 MG tablet Take 1 tablet (10 mg total) by mouth daily.    glucose blood test strip Use to check blood sugars daily as instructed    insulin glargine (LANTUS SOLOSTAR) 100 UNIT/ML Solostar Pen Inject 12 Units into the skin at bedtime. 10/09/2021: Reports dose was increased to 14 units.   isosorbide mononitrate (IMDUR) 60 MG 24 hr tablet Take 1 tablet (60 mg total) by mouth daily.    Lancets (ONETOUCH DELICA PLUS  [id number]) MISC USE UP TO 4 TIMES DAILY AS DIRECTED    loperamide (IMODIUM) 2 MG capsule Take 2 mg by mouth daily as needed for diarrhea or loose stools.    metoprolol tartrate (LOPRESSOR) 100 MG tablet Take 100 mg by mouth 2 (two) times daily.    nitroGLYCERIN (NITROSTAT) 0.4 MG SL tablet Place 1 tablet (0.4 mg total) under the tongue every 5 (five) minutes as needed for chest pain.    nystatin (NYSTATIN) powder Apply 1 application topically daily. (Patient taking differently: Apply 1 application  topically as needed.)    ondansetron (ZOFRAN-ODT) 8 MG disintegrating tablet Take 1 tablet (8 mg total) by mouth every 8 (eight) hours as needed for refractory nausea / vomiting.    pantoprazole (PROTONIX) 20 MG tablet Take 1 tablet (20 mg total) by mouth 2 (two) times daily.    rosuvastatin (CRESTOR) 40 MG tablet Take 1 tablet (40 mg total) by mouth daily.    sacubitril-valsartan (ENTRESTO) 24-26 MG Take 1 tablet by mouth daily.    sertraline (ZOLOFT) 100 MG tablet Take 1 tablet (100 mg total) by mouth daily.    TRULICITY 1.5 VA/7.0LI SOPN INJECT 1.5 MG into THE SKIN ONCE A WEEK    [DISCONTINUED] cetirizine (ZYRTEC) 5 MG tablet Take 2 tablets (10 mg total) by mouth daily.    No facility-administered encounter medications on file as of 10/13/2021.  Care Gaps: COVID 19 Vaccine (4- Booster) Ophthalmology Exam (Last Completed 06/28/2019) Tetanus Vaccine (Last Completed 07/28/2010) Shingrix Vaccine (2 of 2) (Last Completed 06/14/2020) Foot Exam (Last Completed 10/27/2019) Diabetic Kidney Evaluation- Urine ACR   Star Rating Drugs: 10/05/2021 Rosuvastatin 40 mg last filled for a 30- Day supply at [facility] 05/18/2021 Losartan 25 mg last filled for a 90-Day supply at [facility] 09/14/2021 Jardiance 25 mg last filled for a 30-Day Supply at [facility] 73/41/9379 Trulicity 1.5 mg last filled for a 28-Day supply at [facility]   Medication Fill Gaps: None ID  Reviewed chart for  medication changes ahead of medication coordination call.    BP Readings from Last 3 Encounters:  09/30/21 (!) 100/53  09/30/21 110/60  09/07/21 102/60    Lab Results  Component Value Date   HGBA1C 8.2 (H) 07/30/2021     Patient obtains medications through Adherence Packaging  90 Days   Last adherence delivery included:  Pantoprazole 20 mg one tablet two times a day- breakfast, Evening meals Losartan 25 mg one tablet daily- Bedtime Eliquis 5 mg 1 tablet two times daily breakfast, evening meals Jardiance 25 mg 1 tablet daily before breakfast Rosuvastatin 40 mg 1 tablet daily - Breakfast Sertraline 100 mg 1 tablet daily breakfast  Metoprolol tartrate 100 mg 1 tablet 2 times daily Breakfast and Evening Meals Isosorbide Mononitrate 120 mg 1 tablet daily - Breakfast Ondansetron 8 mg PRN Nitroglycerin 0.4 mg PRN Onetouch Ultra Test Strip   Patient declined medications last month  Trulicity inject 1.5 mg into the skin weekly  Insulin Glargine 100 unit/ML Solostar Pen- Inject 14 units into the skin at bedtime  Pen Needles - adequate supply  ezetimibe 10 mg 1 tablet daily breakfast  Clopidogrel 75 mg 1 tablet daily breakfast   Patient is due for next adherence delivery on: 10/13/2021. Called patient and reviewed medications and coordinated delivery.  This delivery to include: Pantoprazole 20 mg one tablet two times a day- breakfast, Evening meals Eliquis 5 mg 1 tablet two times daily breakfast, evening meals Jardiance 25 mg 1 tablet daily before breakfast Rosuvastatin 40 mg 1 tablet daily - Breakfast Sertraline 100 mg 1 tablet daily breakfast  Metoprolol tartrate 100 mg 1 tablet 2 times daily Breakfast and Evening Meals Isosorbide Mononitrate 60 mg 1 tablet daily - Breakfast Ondansetron 8 mg PRN Onetouch Ultra Test Strip  Pen Needles Comfort Ez Pen Needles 32Gx 4 MM - Use to inject insulin daily Alprazolam 0.24 mg PRN Trulicity inject 1.5 mg into the skin weekly  Insulin  Glargine 100 unit/ML Solostar Pen- Inject 12 units into the skin at bedtime   Patient declined the following medications  Entresto 24-26 mg 1 tablet daily last filled 10/05/2021 [facility]. Nitroglycerin 0.4 mg PRN ezetimibe 10 mg 1 tablet daily breakfast last filled 10/05/2021 [facility] day [facility] Clopidogrel 75 mg 1 tablet daily breakfast last filled 10/05/2021 [facility] day [facility]  Patient needs refills for None ID.  Confirmed delivery date of 10/13/2021 ([facility]), advised patient that pharmacy will contact them the morning of delivery.  [place] Pharmacist Assistant [phone]

## 2021-10-14 ENCOUNTER — Other Ambulatory Visit: Payer: Self-pay | Admitting: Physician Assistant

## 2021-10-14 DIAGNOSIS — E1169 Type 2 diabetes mellitus with other specified complication: Secondary | ICD-10-CM

## 2021-10-14 NOTE — Progress Notes (Addendum)
Per [facility], patient has a lot of her medications saying they cannot be filled because she had them filled somewhere else.On 7/10-Trulicity was filled and cannot be filled until 7/31. Rosuvastatin cannot be filled until 8/2, along with Pantoprazole, Eliquis and the [employer].Lastly, the Iso Mono cannot be filled until the 12th of September.   I [facility] to [facility] to confirm if patient pick up Trulicity,  Rosuvastatin, Pantoprazole, Eliquis, Metoprolol, and Isosorbide, and the day supply.Per [facility] patient requested to have them filled, but never pick up her medication as they are still on the self.I Inform [facility] of [facility] response and request them to get a insurance override to be able to get these medication filled and deliver to the patient. Notified [facility]. Notified Clinical pharmacist of all above.  [place] Pharmacist Assistant [phone]

## 2021-10-15 ENCOUNTER — Telehealth: Payer: Self-pay

## 2021-10-15 NOTE — Progress Notes (Signed)
Chronic Care Management.  APPOINTMENT REMINDER   Called Candace [name], No answer, left message of appointment on 10/16/2021 at 8:30 am via telephone visit with Junius Argyle , Pharm D. Notified to have all medications, supplements, blood pressure and/or blood sugar logs available during appointment and to return call if need to reschedule.   [place] Pharmacist Assistant [phone]

## 2021-10-16 ENCOUNTER — Telehealth: Payer: Self-pay

## 2021-10-16 ENCOUNTER — Ambulatory Visit: Payer: 59

## 2021-10-16 DIAGNOSIS — E782 Mixed hyperlipidemia: Secondary | ICD-10-CM

## 2021-10-16 DIAGNOSIS — E1142 Type 2 diabetes mellitus with diabetic polyneuropathy: Secondary | ICD-10-CM

## 2021-10-16 NOTE — Progress Notes (Signed)
I reach out to [facility] to request them to return patient medication that she did not pick : Trulicity , Rosuvastatin, Pantoprazole, Eliquis and the [employer].Lastly, the Iso Mono.I also requested it to be done as soon as possible since the patient is completely out.   Per [facility], she return all the medication while on the phone with me, and the patient should be able to get her medications [facility] now.Notified Clinical pharmacist and [facility].  [place] Pharmacist Assistant [phone]

## 2021-10-16 NOTE — Progress Notes (Unsigned)
Chronic Care Management Pharmacy Note  10/19/2021 Name:  Candace [name] MRN:  [id number] DOB:  1951/03/28  Summary: Patient presents for CCM follow-up.   Recommendations/Changes made from today's visit: -Continue current medications   Plan: CPP Follow-up in one week    Subjective: ASHANA TULLO is an 71 y.o. year old female who is a primary patient of [name]., MD.  The CCM team was consulted for assistance with disease management and care coordination needs.    Engaged with patient by telephone for follow up visit in response to provider referral for pharmacy case management and/or care coordination services.   Consent to Services:  The patient was given information about Chronic Care Management services, agreed to services, and gave verbal consent prior to initiation of services.  Please see initial visit note for detailed documentation.   Patient Care Team: [name]., MD as PCP - General (Family Medicine) Corey Skains, MD as Consulting Physician (Cardiology) Sharlotte Alamo, DPM (Podiatry) Virgel Manifold, MD (Inactive) as Consulting Physician (Gastroenterology) Pa, [place], [facility] as Pharmacist (Pharmacist)  Recent office visits: 09/07/21: Patient presented to Dr. Rosanna Randy for follow-up.  06/30/21: Patient presented to Mikey Kirschner, PA-C for hip pain. Tramadol.   Recent consult visits: 07/15/21: Patient presented to Darylene Price, [place] for follow-up.  03/12/21: Patient presented to Dr. Nehemiah Massed (Cardiology) for follow-up. No medication changes noted.   Hospital visits: 07/15/21-07/17/21: Patient hospitalized for chest pain.   Objective:  Lab Results  Component Value Date   CREATININE 0.94 09/07/2021   BUN 14 09/07/2021   GFRNONAA >60 08/18/2021   GFRAA 108 02/06/2020   NA 140 09/07/2021   K 4.0 09/07/2021   CALCIUM 9.2 09/07/2021   CO2 21 09/07/2021    Lab Results  Component Value Date/Time    HGBA1C 8.2 (H) 07/30/2021 02:45 PM   HGBA1C 8.5 (H) 03/26/2021 09:07 AM   HGBA1C 9.2 07/10/2019 12:00 AM   HGBA1C 8.3 (H) 07/14/2013 05:42 AM   MICROALBUR 50 08/11/2020 08:51 AM    Last diabetic Eye exam:  Lab Results  Component Value Date/Time   HMDIABEYEEXA No Retinopathy 06/28/2019 12:00 AM    Last diabetic Foot exam: No results found for: "HMDIABFOOTEX"   Lab Results  Component Value Date   CHOL 164 08/11/2020   HDL 57 08/11/2020   LDLCALC 90 08/11/2020   TRIG 94 08/11/2020   CHOLHDL 2.9 08/11/2020       Latest Ref Rng & Units 09/07/2021    4:32 PM 08/18/2021    4:35 AM 08/16/2021    6:16 AM  Hepatic Function  Total Protein 6.0 - 8.5 g/dL 6.3  6.3  6.6   Albumin 3.8 - 4.8 g/dL 4.1  3.2  3.3   AST 0 - 40 IU/L 30  38  61   ALT 0 - 32 IU/L 38  39  39   Alk Phosphatase 44 - 121 IU/L 125  99  85   Total Bilirubin 0.0 - 1.2 mg/dL <0.2  0.4  0.6     Lab Results  Component Value Date/Time   TSH 1.560 09/07/2021 04:32 PM   TSH 1.320 06/10/2021 04:03 PM       Latest Ref Rng & Units 09/07/2021    4:32 PM 08/14/2021    4:41 AM 08/13/2021    5:18 AM  CBC  WBC 3.4 - 10.8 x10E3/uL 7.0  4.7  4.6   Hemoglobin 11.1 - 15.9 g/dL  12.2  12.1  11.7   Hematocrit 34.0 - 46.6 % 37.1  38.7  37.1   Platelets 150 - 450 x10E3/uL 186  151  143     Lab Results  Component Value Date/Time   VD25OH 12.2 (L) 10/01/2016 09:35 AM    Clinical ASCVD: Yes  The ASCVD Risk score (Arnett DK, et al., 2019) failed to calculate for the following reasons:   The patient has a prior MI or stroke diagnosis       08/10/2021    8:45 AM 07/15/2021    9:13 AM 06/30/2021    1:10 PM  Depression screen PHQ 2/9  Decreased Interest 0 0 0  Down, Depressed, Hopeless 0 0 0  PHQ - 2 Score 0 0 0  Altered sleeping 1  1  Tired, decreased energy 1  1  Change in appetite 0  1  Feeling bad or failure about yourself  0  0  Trouble concentrating 0  0  Moving slowly or fidgety/restless 0  0  Suicidal thoughts 0  0   PHQ-9 Score 2  3  Difficult doing work/chores Not difficult at all  Not difficult at all    Social History   Tobacco Use  Smoking Status Former   Packs/day: 0.50   Types: Cigarettes   Quit date: 10/07/2001   Years since quitting: 20.0  Smokeless Tobacco Never   BP Readings from Last 3 Encounters:  09/30/21 (!) 100/53  09/30/21 110/60  09/07/21 102/60   Pulse Readings from Last 3 Encounters:  09/30/21 82  09/30/21 87  09/07/21 79   Wt Readings from Last 3 Encounters:  09/30/21 171 lb (77.6 kg)  09/30/21 171 lb (77.6 kg)  08/22/21 180 lb 12.4 oz (82 kg)    Assessment/Interventions: Review of patient past medical history, allergies, medications, health status, including review of consultants reports, laboratory and other test data, was performed as part of comprehensive evaluation and provision of chronic care management services.   SDOH:  (Social Determinants of Health) assessments and interventions performed: Yes    CCM Care Plan  Allergies  Allergen Reactions   Tramadol Other (See Comments)    Causes patient to be off balance and Mental Changes   Lisinopril Cough   Penicillins Swelling, Rash and Other (See Comments)    Did it involve swelling of the face/tongue/throat, SOB, or low BP? Yes Did it involve sudden or severe rash/hives, skin peeling, or any reaction on the inside of your mouth or nose? Yes Did you need to seek medical attention at a hospital or doctor's office? Yes When did it last happen?      15 years If all above answers are "NO", may proceed with cephalosporin use.     Medications Reviewed Today     Reviewed by Neldon Labella, RN (Registered Nurse) on 10/09/21 at 77  Med List Status: <None>   Medication Order Taking? Sig Documenting Provider Last Dose Status Informant  acetaminophen (TYLENOL) 325 MG tablet [id number]  Take 650 mg by mouth every 6 (six) hours as needed for moderate pain or headache.  [provider]  Active Self   albuterol (VENTOLIN HFA) 108 (90 Base) MCG/ACT inhaler [id number]  Inhale 2 puffs into the lungs every 6 (six) hours as needed for wheezing or shortness of breath. [name]., MD  Active Self  ALPRAZolam Duanne Moron) 0.25 MG tablet [id number]  Take 1 tablet (0.25 mg total) by mouth every 8 (eight) hours as needed for anxiety. Rosanna Randy,  Retia Passe., MD  Active Self  Blood Glucose Monitoring Suppl [facility] VERIO) w/Device Drucie Opitz [id number]  Use daily to check blood sugar Brita Romp Dionne Bucy, MD  Active Self    Discontinued 03/06/19 1830   clopidogrel (PLAVIX) 75 MG tablet [id number]  Take 1 tablet (75 mg total) by mouth daily. [name]., MD  Active Self  COMFORT EZ PEN NEEDLES 32G X 4 MM MISC [id number]  Use to inject insulin daily [name]., MD  Active   ELIQUIS 5 MG TABS tablet [id number]  Take 5 mg by mouth 2 (two) times daily. [provider]  Active Self           Med Note Minerva Ends, FELECIA N   Fri Oct 09, 2021  3:12 PM) Reports dose was changed to once a day.  empagliflozin (JARDIANCE) 25 MG TABS tablet [id number]  Take 1 tablet (25 mg total) by mouth daily. Mikey Kirschner, PA-C  Active Self  ezetimibe (ZETIA) 10 MG tablet [id number]  Take 1 tablet (10 mg total) by mouth daily. [name]., MD  Active Self  glucose blood test strip [id number]  Use to check blood sugars daily as instructed [name]., MD  Active Self  insulin glargine (LANTUS SOLOSTAR) 100 UNIT/ML Solostar Pen [id number]  Inject 12 Units into the skin at bedtime. Jennye Boroughs, MD  Active   isosorbide mononitrate (IMDUR) 60 MG 24 hr tablet [id number] Yes Take 1 tablet (60 mg total) by mouth daily. Minna Merritts, MD Taking Active   Lancets (ONETOUCH DELICA PLUS [id number]) [place] [id number]  USE UP TO 4 TIMES DAILY AS DIRECTED [name]., MD  Active Self  loperamide (IMODIUM) 2 MG capsule [id number]  Take 2 mg by mouth daily as needed for diarrhea or loose stools.  [provider]  Active Self           Med Note Loann Quill   Tue Aug 11, 2021  3:03 PM)    metoprolol tartrate (LOPRESSOR) 100 MG tablet [id number]  Take 100 mg by mouth 2 (two) times daily. [provider]  Active Self           Med Note Lawson Radar S   Tue Aug 11, 2021  3:04 PM)    nitroGLYCERIN (NITROSTAT) 0.4 MG SL tablet [id number]  Place 1 tablet (0.4 mg total) under the tongue every 5 (five) minutes as needed for chest pain. Bettey Costa, MD  Active Self           Med Note Johny Drilling Mar 27, 2018  2:39 PM)    nystatin (NYSTATIN) powder [id number]  Apply 1 application topically daily.  Patient taking differently: Apply 1 application  topically as needed.   Rubie Maid, MD  Active Self  ondansetron (ZOFRAN-ODT) 8 MG disintegrating tablet [id number]  Take 1 tablet (8 mg total) by mouth every 8 (eight) hours as needed for refractory nausea / vomiting. Mikey Kirschner, PA-C  Active Self           Med Note Loann Quill   Tue Aug 11, 2021  3:04 PM)    pantoprazole (PROTONIX) 20 MG tablet [id number]  Take 1 tablet (20 mg total) by mouth 2 (two) times daily. [name]., MD  Active Self  rosuvastatin (CRESTOR) 40 MG tablet [id number]  Take 1 tablet (40 mg total) by mouth daily. [name]., MD  Active Self  sacubitril-valsartan [facility])  24-26 MG [id number]  Take 1 tablet by mouth daily. Minna Merritts, MD  Active   sertraline (ZOLOFT) 100 MG tablet [id number]  Take 1 tablet (100 mg total) by mouth daily. [name]., MD  Active Self  TRULICITY 1.5 PY/0.9XI [place] [facility] [id number]  INJECT 1.5 MG into THE SKIN ONCE A WEEK [name]., MD  Active             Patient Active Problem List   Diagnosis Date Noted   History of vertigo 08/16/2021   Obesity (BMI 30-39.9) 08/14/2021   Positive blood culture 08/13/2021   Atrial fibrillation with RVR ([place]) 08/13/2021   Hypokalemia 08/12/2021   COVID-19 08/11/2021    Orthostatic hypotension 08/11/2021   Left hip pain 06/30/2021   Paroxysmal atrial fibrillation ([place]) 07/10/2020   History of coronary artery bypass graft 07/10/2020   Unsteady gait    Acute bronchitis 03/07/2019   Esophageal dysphagia    Stomach irritation    Gastric polyp    Esophageal lump    Hx of colonic polyp    Polyp of colon    Diverticulosis of large intestine without diverticulitis    PSVT (paroxysmal supraventricular tachycardia) ([place]) 10/03/2018   Abnormal ECG 07/05/2018   Tachycardia 03/27/2018   Pain in limb 02/28/2018   Chronic diastolic CHF (congestive heart failure) ([place]) 11/03/2017   TIA (transient ischemic attack) 11/03/2017   COPD suggested by initial evaluation ([place]) 08/23/2017   Postoperative state 08/15/2017   Incidental pulmonary nodule 08/01/2017   Non-ST elevation myocardial infarction (NSTEMI), subendocardial infarction, subsequent episode of care ([place]) 06/01/2017   B12 deficiency 12/01/2016   Vitamin D deficiency 11/04/2016   Depression with anxiety 09/30/2016   AF (paroxysmal atrial fibrillation) ([place]) 09/30/2016   Generalized weakness 09/30/2016   Resting tremor 09/30/2016   Mild obstructive sleep apnea 09/30/2016   Headache 08/18/2016   Unstable angina ([place]) 08/03/2016   Type 2 diabetes mellitus with diabetic polyneuropathy, with long-term current use of insulin ([place]) 08/03/2016   Chronic daily headache 05/05/2016   Asthma 11/11/2015   S/P CABG x 4 11/10/2015   Presence of aortocoronary bypass graft 11/10/2015   Coronary artery disease 11/07/2015   Carotid artery narrowing 02/08/2014   Chest pain 02/08/2014   Mixed hyperlipidemia 02/08/2014   Bilateral carotid artery stenosis 02/08/2014   Atypical chest pain 02/07/2014   Essential hypertension 02/07/2014   Awareness of heartbeats 02/07/2014   D (diarrhea) 10/05/2013   Gastroesophageal reflux disease 10/05/2013    Immunization History  Administered Date(s) Administered   Fluad Quad(high  Dose 65+) 12/28/2018, 12/25/2020   Influenza, High Dose Seasonal PF 12/24/2016, 12/08/2017   Influenza,inj,Quad PF,6+ Mos 12/19/2015, 01/18/2020   Influenza-Unspecified 03/06/2015   Moderna Sars-Covid-2 Vaccination 04/14/2019, 05/12/2019, 01/28/2020   Pneumococcal Conjugate-13 03/16/2018   Pneumococcal Polysaccharide-23 12/24/2016   Tdap 07/28/2010   Zoster Recombinat (Shingrix) 06/14/2020    Conditions to be addressed/monitored:  Hypertension, Hyperlipidemia, Diabetes, Atrial Fibrillation, Heart Failure, Coronary Artery Disease, GERD, Asthma, Depression and Anxiety  Care Plan : General Pharmacy (Adult)  Updates made by Germaine Pomfret, RPH since 10/19/2021 12:00 AM     Problem: Hypertension, Hyperlipidemia, Diabetes, Atrial Fibrillation, Heart Failure, Coronary Artery Disease, GERD, Asthma, Depression and Anxiety   Priority: High     Long-Range Goal: Patient-Specific Goal   Start Date: 06/06/2020  Expected End Date: 10/20/2022  This Visit's Progress: On track  Recent Progress: On track  Priority: High  Note:   Current Barriers:  Unable to achieve  control of diabetes  Unable to achieve control of cholesterol Does not adhere to prescribed medication regimen  Pharmacist Clinical Goal(s):  Over the next 90 days, patient will achieve control of diabetes as evidenced by A1c less than 8% patient will achieve control of cholesterol as evidenced by LDL less than 70 adhere to prescribed medication regimen as evidenced by utilization of enhanced pharmacy services through collaboration with PharmD and provider.   Interventions: 1:1 collaboration with [name]., MD regarding development and update of comprehensive plan of care as evidenced by provider attestation and co-signature Inter-disciplinary care team collaboration (see longitudinal plan of care) Comprehensive medication review performed; medication list updated in electronic medical record  Diabetes (A1c goal  <8%) -Uncontrolled -Diagnosed 2010 -Current medications:  Trulicity 1.5 mg weekly on Thursdays: Appropriate, Query effective   Jardiance 25 mg daily: Appropriate, Query effective   Lantus 14 units nightly (0.24 u/kg): Appropriate, Query effective   -Medications previously tried: Glipizide, Lantus, Tradjenta, Metformin, Metformin XR, Ozempic   -Current home glucose readings: 320 post-prandial  -Denies hypoglycemic symptoms. -Nausea periodically throughout the week. -Current meal patterns: Light meals, mainly salads, yogurt.  -Current exercise: Less exercise due to the weather. Walking 3-4 times weekly (15-20 minutes)    -Continue current medications -Increase monitoring to 1-2x daily, recheck readings in one week.   Hyperlipidemia: (LDL goal < 55) -History of CABG x4 2017, 2018  -Uncontrolled -Current treatment: Rosuvastatin 40 mg daily: Appropriate, Query effective  Ezetimibe 10 mg daily: Appropriate, Query effective   -Current antiplatelet treatment: Clopidogrel 75 mg daily  -Medications previously tried: Atorvastatin (changed to rosuvastatin during hospitalization)  -Recommended to continue current medication -Recheck fasting lipid panel at PCP follow-up.   Atrial Fibrillation (Goal: prevent stroke and major bleeding) -Controlled -CHADSVASC: 6 -Current treatment: Rate control: Metoprolol tartrate 100 mg twice daily Anticoagulation: Eliquis 5 mg twice daily -Medications previously tried: NA -Denies symptoms of A-fib -Recommended to continue current medication  Heart Failure (Goal: manage symptoms and prevent exacerbations) -Controlled -Last ejection fraction: 55-60% (Date: June 2021) -HF type: Diastolic -NYHA Class: III (marked limitation of activity) -AHA HF Stage: C (Heart disease and symptoms present) -Current treatment: Entresto 24-26 mg twice daily  Imdur 120 mg daily  Metoprolol tartrate 100 mg twice daily -Medications previously tried: Furosemide  -Current  home BP/HR readings: 110-140s/60-70s  -Recommended to continue current medication  Depression/Anxiety (Goal: maintain symptom remission) -Uncontrolled -Current treatment: Sertraline 100 mg daily  -Medications previously tried/failed: NA -PHQ9: 0 -GAD7: 2 -Continue current medications  GERD with dysphagia (Goal: prevent heartburn or reflux symptoms) -Controlled -Current treatment  Pantoprazole 40 mg daily   -Medications previously tried: Sucralfate,  -Continue current medications  Patient Goals/Self-Care Activities Over the next 90 days, patient will:  - check glucose daily, before breakfast, document, and provide at future appointments check blood pressure weekly, document, and provide at future appointments  Follow Up Plan: Telephone follow up appointment with care management team member scheduled for:  10/27/2021 at 1:00 PM     Medication Assistance: None required.  Patient affirms current coverage meets needs.  Patient's preferred pharmacy is:  [facility] - [place], [place] - [address] [place] [address] Phone: [phone] Fax: [phone]  [id number] [place] [id number]: [phone]: [phone] [id number] [place] [address] [place] Phone: [phone] Fax: [phone]  Patient decided to: Utilize [facility] for medication synchronization, packaging and delivery   Care Plan and Follow Up Patient Decision:  Patient agrees to Care Plan and Follow-up.  Plan: Telephone follow up appointment with care management team member scheduled for:  10/16/2021 at 8:30 AM  Junius Argyle, PharmD, [place], [place] [phone]

## 2021-10-19 NOTE — Patient Instructions (Signed)
Visit Information It was great speaking with you today!  Please let me know if you have any questions about our visit.   Goals Addressed             This Visit's Progress    Monitor and Manage My Blood Sugar-Diabetes Type 2   On track    Timeframe:  Long-Range Goal Priority:  High Start Date: 04/15/20      Expected End Date: 02/27/2023                  Follow Up within 30 days   - Check blood sugar daily before breakfast - Check blood sugar if I feel it is too high or too low - Enter blood sugar readings and medication or insulin into daily log - Take the blood sugar log to all doctor visits - Take the blood sugar meter to all doctor visits           Patient Care Plan: Diabetes Type 2 (Adult)  Completed 02/17/2021   Problem Identified: Glycemic Management (Diabetes, Type 2) Resolved 02/17/2021  Priority: High  Onset Date: 04/15/2020     Long-Range Goal: Glycemic Management Optimized Completed 02/17/2021  Start Date: 11/21/2020  Expected End Date: 03/21/2021  Recent Progress: On track  Priority: High  Note:   resolved     Patient Care Plan: General Pharmacy (Adult)     Problem Identified: Hypertension, Hyperlipidemia, Diabetes, Atrial Fibrillation, Heart Failure, Coronary Artery Disease, GERD, Asthma, Depression and Anxiety   Priority: High     Long-Range Goal: Patient-Specific Goal   Start Date: 06/06/2020  Expected End Date: 10/20/2022  This Visit's Progress: On track  Recent Progress: On track  Priority: High  Note:   Current Barriers:  Unable to achieve control of diabetes  Unable to achieve control of cholesterol Does not adhere to prescribed medication regimen  Pharmacist Clinical Goal(s):  Over the next 90 days, patient will achieve control of diabetes as evidenced by A1c less than 8% patient will achieve control of cholesterol as evidenced by LDL less than 70 adhere to prescribed medication regimen as evidenced by utilization of enhanced pharmacy  services through collaboration with PharmD and provider.   Interventions: 1:1 collaboration with [name]., MD regarding development and update of comprehensive plan of care as evidenced by provider attestation and co-signature Inter-disciplinary care team collaboration (see longitudinal plan of care) Comprehensive medication review performed; medication list updated in electronic medical record  Diabetes (A1c goal <8%) -Uncontrolled -Diagnosed 2010 -Current medications:  Trulicity 1.5 mg weekly on Thursdays: Appropriate, Query effective   Jardiance 25 mg daily: Appropriate, Query effective   Lantus 14 units nightly (0.24 u/kg): Appropriate, Query effective   -Medications previously tried: Glipizide, Lantus, Tradjenta, Metformin, Metformin XR, Ozempic   -Current home glucose readings: 320 post-prandial  -Denies hypoglycemic symptoms. -Nausea periodically throughout the week. -Current meal patterns: Light meals, mainly salads, yogurt.  -Current exercise: Less exercise due to the weather. Walking 3-4 times weekly (15-20 minutes)    -Continue current medications -Increase monitoring to 1-2x daily, recheck readings in one week.   Hyperlipidemia: (LDL goal < 55) -History of CABG x4 2017, 2018  -Uncontrolled -Current treatment: Rosuvastatin 40 mg daily: Appropriate, Query effective  Ezetimibe 10 mg daily: Appropriate, Query effective   -Current antiplatelet treatment: Clopidogrel 75 mg daily  -Medications previously tried: Atorvastatin (changed to rosuvastatin during hospitalization)  -Recommended to continue current medication -Recheck fasting lipid panel at PCP follow-up.   Atrial Fibrillation (Goal:  prevent stroke and major bleeding) -Controlled -CHADSVASC: 6 -Current treatment: Rate control: Metoprolol tartrate 100 mg twice daily Anticoagulation: Eliquis 5 mg twice daily -Medications previously tried: NA -Denies symptoms of A-fib -Recommended to continue current  medication  Heart Failure (Goal: manage symptoms and prevent exacerbations) -Controlled -Last ejection fraction: 55-60% (Date: June 2021) -HF type: Diastolic -NYHA Class: III (marked limitation of activity) -AHA HF Stage: C (Heart disease and symptoms present) -Current treatment: Entresto 24-26 mg twice daily  Imdur 120 mg daily  Metoprolol tartrate 100 mg twice daily -Medications previously tried: Furosemide  -Current home BP/HR readings: 110-140s/60-70s  -Recommended to continue current medication  Depression/Anxiety (Goal: maintain symptom remission) -Uncontrolled -Current treatment: Sertraline 100 mg daily  -Medications previously tried/failed: NA -PHQ9: 0 -GAD7: 2 -Continue current medications  GERD with dysphagia (Goal: prevent heartburn or reflux symptoms) -Controlled -Current treatment  Pantoprazole 40 mg daily   -Medications previously tried: Sucralfate,  -Continue current medications  Patient Goals/Self-Care Activities Over the next 90 days, patient will:  - check glucose daily, before breakfast, document, and provide at future appointments check blood pressure weekly, document, and provide at future appointments  Follow Up Plan: Telephone follow up appointment with care management team member scheduled for:  10/27/2021 at 1:00 PM    Patient agreed to services and verbal consent obtained.   The patient verbalized understanding of instructions, educational materials, and care plan provided today and DECLINED offer to receive copy of patient instructions, educational materials, and care plan.   Junius Argyle, PharmD, [place], CPP  Clinical Pharmacist Practitioner  [facility] [phone]

## 2021-10-21 ENCOUNTER — Ambulatory Visit: Payer: Self-pay

## 2021-10-21 DIAGNOSIS — I5032 Chronic diastolic (congestive) heart failure: Secondary | ICD-10-CM

## 2021-10-21 DIAGNOSIS — I480 Paroxysmal atrial fibrillation: Secondary | ICD-10-CM

## 2021-10-21 DIAGNOSIS — I200 Unstable angina: Secondary | ICD-10-CM

## 2021-10-21 NOTE — Chronic Care Management (AMB) (Signed)
     10/21/2021 Name: Candace [name] MRN: [id number] DOB: 1950-10-19   Candace [name] is a 71 y.o. year old female who is a primary care patient of [name]., MD.   Documentation encounter created to complete case transition. All care goals addressed. The care management team will continue to follow for care coordination.   [facility] [phone]

## 2021-10-21 NOTE — Patient Instructions (Signed)
Thank you for allowing the Chronic Care Management team to participate in your care. It was great speaking with you today! Please don't hesitate to call if you require additional outreach.   [place] [facility] [phone]

## 2021-10-23 ENCOUNTER — Encounter: Payer: Self-pay | Admitting: Cardiovascular Disease

## 2021-10-26 ENCOUNTER — Telehealth: Payer: Self-pay

## 2021-10-26 DIAGNOSIS — I200 Unstable angina: Secondary | ICD-10-CM

## 2021-10-26 DIAGNOSIS — I5032 Chronic diastolic (congestive) heart failure: Secondary | ICD-10-CM

## 2021-10-26 DIAGNOSIS — I480 Paroxysmal atrial fibrillation: Secondary | ICD-10-CM | POA: Diagnosis not present

## 2021-10-26 DIAGNOSIS — E1142 Type 2 diabetes mellitus with diabetic polyneuropathy: Secondary | ICD-10-CM

## 2021-10-26 DIAGNOSIS — E782 Mixed hyperlipidemia: Secondary | ICD-10-CM | POA: Diagnosis not present

## 2021-10-26 DIAGNOSIS — Z794 Long term (current) use of insulin: Secondary | ICD-10-CM

## 2021-10-26 NOTE — Progress Notes (Signed)
Chronic Care Management  APPOINTMENT REMINDER   Called Candace [name], No answer, left message of appointment on 10/27/2021 at 1:00 pm via telephone visit with Junius Argyle , Pharm D. Notified to have all medications, supplements, blood pressure and/or blood sugar logs available during appointment and to return call if need to reschedule.  [facility] Pharmacist Assistant [phone]

## 2021-10-27 ENCOUNTER — Telehealth: Payer: 59

## 2021-10-27 NOTE — Progress Notes (Deleted)
Chronic Care Management Pharmacy Note  10/27/2021 Name:  Candace [name] MRN:  [id number] DOB:  [birth year]  Summary: Patient presents for CCM follow-up.   Recommendations/Changes made from today's visit: -Continue current medications   Plan: CPP Follow-up in one week    Subjective: Candace [name] is an 71 y.o. year old female who is a primary patient of [name]., MD.  The CCM team was consulted for assistance with disease management and care coordination needs.    Engaged with patient by telephone for follow up visit in response to provider referral for pharmacy case management and/or care coordination services.   Consent to Services:  The patient was given information about Chronic Care Management services, agreed to services, and gave verbal consent prior to initiation of services.  Please see initial visit note for detailed documentation.   Patient Care Team: [name]., MD as PCP - General (Family Medicine) Corey Skains, MD as Consulting Physician (Cardiology) Sharlotte Alamo, DPM (Podiatry) Virgel Manifold, MD (Inactive) as Consulting Physician (Gastroenterology) [place], [place], [facility] as Pharmacist (Pharmacist)  Recent office visits: 09/07/21: Patient presented to Dr. Rosanna Randy for follow-up.  06/30/21: Patient presented to Mikey Kirschner, PA-C for hip pain. Tramadol.   Recent consult visits: 07/15/21: Patient presented to Darylene Price, [place] for follow-up.  03/12/21: Patient presented to Dr. Nehemiah Massed (Cardiology) for follow-up. No medication changes noted.   Hospital visits: 07/15/21-07/17/21: Patient hospitalized for chest pain.   Objective:  Lab Results  Component Value Date   CREATININE 0.94 09/07/2021   BUN 14 09/07/2021   GFRNONAA >60 08/18/2021   GFRAA 108 02/06/2020   NA 140 09/07/2021   K 4.0 09/07/2021   CALCIUM 9.2 09/07/2021   CO2 21 09/07/2021    Lab Results  Component Value Date/Time    HGBA1C 8.2 (H) 07/30/2021 02:45 PM   HGBA1C 8.5 (H) 03/26/2021 09:07 AM   HGBA1C 9.2 07/10/2019 12:00 AM   HGBA1C 8.3 (H) 07/14/2013 05:42 AM   MICROALBUR 50 08/11/2020 08:51 AM    Last diabetic Eye exam:  Lab Results  Component Value Date/Time   HMDIABEYEEXA No Retinopathy 06/28/2019 12:00 AM    Last diabetic Foot exam: No results found for: "HMDIABFOOTEX"   Lab Results  Component Value Date   CHOL 164 08/11/2020   HDL 57 08/11/2020   LDLCALC 90 08/11/2020   TRIG 94 08/11/2020   CHOLHDL 2.9 08/11/2020       Latest Ref Rng & Units 09/07/2021    4:32 PM 08/18/2021    4:35 AM 08/16/2021    6:16 AM  Hepatic Function  Total Protein 6.0 - 8.5 g/dL 6.3  6.3  6.6   Albumin 3.8 - 4.8 g/dL 4.1  3.2  3.3   AST 0 - 40 IU/L 30  38  61   ALT 0 - 32 IU/L 38  39  39   Alk Phosphatase 44 - 121 IU/L 125  99  85   Total Bilirubin 0.0 - 1.2 mg/dL <0.2  0.4  0.6     Lab Results  Component Value Date/Time   TSH 1.560 09/07/2021 04:32 PM   TSH 1.320 06/10/2021 04:03 PM       Latest Ref Rng & Units 09/07/2021    4:32 PM 08/14/2021    4:41 AM 08/13/2021    5:18 AM  CBC  WBC 3.4 - 10.8 x10E3/uL 7.0  4.7  4.6   Hemoglobin 11.1 - 15.9 g/dL  12.2  12.1  11.7   Hematocrit 34.0 - 46.6 % 37.1  38.7  37.1   Platelets 150 - 450 x10E3/uL 186  151  143     Lab Results  Component Value Date/Time   VD25OH 12.2 (L) 10/01/2016 09:35 AM    Clinical ASCVD: Yes  The ASCVD Risk score (Arnett DK, et al., 2019) failed to calculate for the following reasons:   The patient has a prior MI or stroke diagnosis       08/10/2021    8:45 AM 07/15/2021    9:13 AM 06/30/2021    1:10 PM  Depression screen PHQ 2/9  Decreased Interest 0 0 0  Down, Depressed, Hopeless 0 0 0  PHQ - 2 Score 0 0 0  Altered sleeping 1  1  Tired, decreased energy 1  1  Change in appetite 0  1  Feeling bad or failure about yourself  0  0  Trouble concentrating 0  0  Moving slowly or fidgety/restless 0  0  Suicidal thoughts 0  0   PHQ-9 Score 2  3  Difficult doing work/chores Not difficult at all  Not difficult at all    Social History   Tobacco Use  Smoking Status Former   Packs/day: 0.50   Types: Cigarettes   Quit date: 10/07/2001   Years since quitting: 20.0  Smokeless Tobacco Never   BP Readings from Last 3 Encounters:  09/30/21 (!) 100/53  09/30/21 110/60  09/07/21 102/60   Pulse Readings from Last 3 Encounters:  09/30/21 82  09/30/21 87  09/07/21 79   Wt Readings from Last 3 Encounters:  09/30/21 171 lb (77.6 kg)  09/30/21 171 lb (77.6 kg)  08/22/21 180 lb 12.4 oz (82 kg)    Assessment/Interventions: Review of patient past medical history, allergies, medications, health status, including review of consultants reports, laboratory and other test data, was performed as part of comprehensive evaluation and provision of chronic care management services.   SDOH:  (Social Determinants of Health) assessments and interventions performed: Yes    CCM Care Plan  Allergies  Allergen Reactions   Tramadol Other (See Comments)    Causes patient to be off balance and Mental Changes   Lisinopril Cough   Penicillins Swelling, Rash and Other (See Comments)    Did it involve swelling of the face/tongue/throat, SOB, or low BP? Yes Did it involve sudden or severe rash/hives, skin peeling, or any reaction on the inside of your mouth or nose? Yes Did you need to seek medical attention at a hospital or doctor's office? Yes When did it last happen?      15 years If all above answers are "NO", may proceed with cephalosporin use.     Medications Reviewed Today     Reviewed by Neldon Labella, RN (Registered Nurse) on 10/21/21 at 1029  Med List Status: <None>   Medication Order Taking? Sig Documenting Provider Last Dose Status Informant  acetaminophen (TYLENOL) 325 MG tablet [id number]  Take 650 mg by mouth every 6 (six) hours as needed for moderate pain or headache.  [provider]  Active Self   albuterol (VENTOLIN HFA) 108 (90 Base) MCG/ACT inhaler [id number]  Inhale 2 puffs into the lungs every 6 (six) hours as needed for wheezing or shortness of breath. [name]., MD  Active Self  ALPRAZolam Duanne Moron) 0.25 MG tablet [id number]  Take 1 tablet (0.25 mg total) by mouth every 8 (eight) hours as needed for anxiety. Rosanna Randy,  Retia Passe., MD  Active Self  Blood Glucose Monitoring Suppl [facility] VERIO) w/Device Drucie Opitz [id number]  Use daily to check blood sugar Brita Romp Dionne Bucy, MD  Active Self    Discontinued 03/06/19 1830   clopidogrel (PLAVIX) 75 MG tablet [id number]  Take 1 tablet (75 mg total) by mouth daily. [name]., MD  Active Self  COMFORT EZ PEN NEEDLES 32G X 4 MM MISC [id number]  Use to inject insulin daily [name]., MD  Active   ELIQUIS 5 MG TABS tablet [id number] Yes Take 5 mg by mouth 2 (two) times daily. [provider] Taking Active Self           Med Note Minerva Ends, FELECIA N   Wed Oct 21, 2021 10:29 AM)    empagliflozin (JARDIANCE) 25 MG TABS tablet [id number]  Take 1 tablet (25 mg total) by mouth daily. Mikey Kirschner, PA-C  Active Self  ezetimibe (ZETIA) 10 MG tablet [id number]  Take 1 tablet (10 mg total) by mouth daily. [name]., MD  Active Self  glucose blood test strip [id number]  Use to check blood sugars daily as instructed [name]., MD  Active Self  insulin glargine (LANTUS SOLOSTAR) 100 UNIT/ML Solostar Pen [id number]  Inject 12 Units into the skin at bedtime. Jennye Boroughs, MD  Active            Med Note [facility], FELECIA N   Fri Oct 09, 2021  3:14 PM) Reports dose was increased to 14 units.  isosorbide mononitrate (IMDUR) 60 MG 24 hr tablet [id number]  Take 1 tablet (60 mg total) by mouth daily. Minna Merritts, MD  Active   Lancets (ONETOUCH DELICA PLUS [id number]) [place] [id number]  USE UP TO 4 TIMES DAILY AS DIRECTED [name]., MD  Active Self  loperamide (IMODIUM) 2 MG capsule  [id number]  Take 2 mg by mouth daily as needed for diarrhea or loose stools. [provider]  Active Self           Med Note Loann Quill   Tue Aug 11, 2021  3:03 PM)    metoprolol tartrate (LOPRESSOR) 100 MG tablet [id number]  Take 100 mg by mouth 2 (two) times daily. [provider]  Active Self           Med Note Lawson Radar S   Tue Aug 11, 2021  3:04 PM)    nitroGLYCERIN (NITROSTAT) 0.4 MG SL tablet [id number]  Place 1 tablet (0.4 mg total) under the tongue every 5 (five) minutes as needed for chest pain. Bettey Costa, MD  Active Self           Med Note Johny Drilling Mar 27, 2018  2:39 PM)    nystatin (NYSTATIN) powder [id number]  Apply 1 application topically daily.  Patient taking differently: Apply 1 application  topically as needed.   Rubie Maid, MD  Active Self  ondansetron (ZOFRAN-ODT) 8 MG disintegrating tablet [id number]  TAKE ONE TABLET BY MOUTH EVERY 8 HOURS AS NEEDED FOR NAUSEA AND VOMITING [name]., MD  Active   pantoprazole (PROTONIX) 20 MG tablet [id number]  Take 1 tablet (20 mg total) by mouth 2 (two) times daily. [name]., MD  Active Self  rosuvastatin (CRESTOR) 40 MG tablet [id number]  Take 1 tablet (40 mg total) by mouth daily. [name]., MD  Active Self  sacubitril-valsartan [facility]) 24-26 [place] [id number] Yes Take  1 tablet by mouth daily. Minna Merritts, MD Taking Active   sertraline (ZOLOFT) 100 MG tablet [id number]  Take 1 tablet (100 mg total) by mouth daily. [name]., MD  Active Self  TRULICITY 1.5 KL/4.9ZP [employer] [id number]  INJECT 1.5 MG into THE SKIN ONCE A WEEK [name]., MD  Active             Patient Active Problem List   Diagnosis Date Noted   History of vertigo 08/16/2021   Obesity (BMI 30-39.9) 08/14/2021   Positive blood culture 08/13/2021   Atrial fibrillation with RVR ([place]) 08/13/2021   Hypokalemia 08/12/2021   COVID-19 08/11/2021   Orthostatic  hypotension 08/11/2021   Left hip pain 06/30/2021   Paroxysmal atrial fibrillation ([place]) 07/10/2020   History of coronary artery bypass graft 07/10/2020   Unsteady gait    Acute bronchitis 03/07/2019   Esophageal dysphagia    Stomach irritation    Gastric polyp    Esophageal lump    Hx of colonic polyp    Polyp of colon    Diverticulosis of large intestine without diverticulitis    PSVT (paroxysmal supraventricular tachycardia) ([place]) 10/03/2018   Abnormal ECG 07/05/2018   Tachycardia 03/27/2018   Pain in limb 02/28/2018   Chronic diastolic CHF (congestive heart failure) ([place]) 11/03/2017   TIA (transient ischemic attack) 11/03/2017   COPD suggested by initial evaluation ([place]) 08/23/2017   Postoperative state 08/15/2017   Incidental pulmonary nodule 08/01/2017   Non-ST elevation myocardial infarction (NSTEMI), subendocardial infarction, subsequent episode of care ([place]) 06/01/2017   B12 deficiency 12/01/2016   Vitamin D deficiency 11/04/2016   Depression with anxiety 09/30/2016   AF (paroxysmal atrial fibrillation) ([place]) 09/30/2016   Generalized weakness 09/30/2016   Resting tremor 09/30/2016   Mild obstructive sleep apnea 09/30/2016   Headache 08/18/2016   Unstable angina ([place]) 08/03/2016   Type 2 diabetes mellitus with diabetic polyneuropathy, with long-term current use of insulin ([facility]) 08/03/2016   Chronic daily headache 05/05/2016   Asthma 11/11/2015   S/P CABG x 4 11/10/2015   Presence of aortocoronary bypass graft 11/10/2015   Coronary artery disease 11/07/2015   Carotid artery narrowing 02/08/2014   Chest pain 02/08/2014   Mixed hyperlipidemia 02/08/2014   Bilateral carotid artery stenosis 02/08/2014   Atypical chest pain 02/07/2014   Essential hypertension 02/07/2014   Awareness of heartbeats 02/07/2014   D (diarrhea) 10/05/2013   Gastroesophageal reflux disease 10/05/2013    Immunization History  Administered Date(s) Administered   Fluad Quad(high Dose 65+)  12/28/2018, 12/25/2020   Influenza, High Dose Seasonal PF 12/24/2016, 12/08/2017   Influenza,inj,Quad PF,6+ Mos 12/19/2015, 01/18/2020   Influenza-Unspecified 03/06/2015   Moderna Sars-Covid-2 Vaccination 04/14/2019, 05/12/2019, 01/28/2020   Pneumococcal Conjugate-13 03/16/2018   Pneumococcal Polysaccharide-23 12/24/2016   Tdap 07/28/2010   Zoster Recombinat (Shingrix) 06/14/2020    Conditions to be addressed/monitored:  Hypertension, Hyperlipidemia, Diabetes, Atrial Fibrillation, Heart Failure, Coronary Artery Disease, GERD, Asthma, Depression and Anxiety  There are no care plans that you recently modified to display for this patient.    Medication Assistance: None required.  Patient affirms current coverage meets needs.  Patient's preferred pharmacy is:  [facility] - [place], [place] - [address] [place] [address] Phone: [phone] Fax: [phone]  [id number] [place] [address]Phone: [phone]: [phone] [id number] [place]  [address] [place] [address] Phone: [phone] Fax: [phone]  Patient decided to: Utilize [facility] for medication synchronization, packaging and delivery   Care Plan and Follow Up Patient Decision:  Patient agrees to Care Plan and Follow-up.  Plan: Telephone follow up appointment with care management team member scheduled for:  10/16/2021 at 8:30 AM  Junius Argyle, PharmD, [place], CPP  Clinical Pharmacist Practitioner  [facility] [phone]  Current Barriers:  Unable to achieve control of diabetes  Unable to achieve control of cholesterol Does not adhere to prescribed medication regimen  Pharmacist Clinical Goal(s):  Over the next 90 days, patient will achieve control of diabetes as evidenced by A1c less than  8% patient will achieve control of cholesterol as evidenced by LDL less than 70 adhere to prescribed medication regimen as evidenced by utilization of enhanced pharmacy services through collaboration with PharmD and provider.   Interventions: 1:1 collaboration with [name]., MD regarding development and update of comprehensive plan of care as evidenced by provider attestation and co-signature Inter-disciplinary care team collaboration (see longitudinal plan of care) Comprehensive medication review performed; medication list updated in electronic medical record  Diabetes (A1c goal <8%) -Uncontrolled -Diagnosed 2010 -Current medications:  Trulicity 1.5 mg weekly on Thursdays: Appropriate, Query effective   Jardiance 25 mg daily: Appropriate, Query effective   Lantus 14 units nightly (0.24 u/kg): Appropriate, Query effective   -Medications previously tried: Glipizide, Lantus, Tradjenta, Metformin, Metformin XR, Ozempic   -Current home glucose readings: 320 post-prandial  -Denies hypoglycemic symptoms. -Nausea periodically throughout the week. -Current meal patterns: Light meals, mainly salads, yogurt.  -Current exercise: Less exercise due to the weather. Walking 3-4 times weekly (15-20 minutes)    -Continue current medications -Increase monitoring to 1-2x daily, recheck readings in one week.   Hyperlipidemia: (LDL goal < 55) -History of CABG x4 2017, 2018  -Uncontrolled -Current treatment: Rosuvastatin 40 mg daily: Appropriate, Query effective  Ezetimibe 10 mg daily: Appropriate, Query effective   -Current antiplatelet treatment: Clopidogrel 75 mg daily  -Medications previously tried: Atorvastatin (changed to rosuvastatin during hospitalization)  -Recommended to continue current medication -Recheck fasting lipid panel at PCP follow-up.   Atrial Fibrillation (Goal: prevent stroke and major bleeding) -Controlled -CHADSVASC: 6 -Current treatment: Rate control:  Metoprolol tartrate 100 mg twice daily Anticoagulation: Eliquis 5 mg twice daily -Medications previously tried: NA -Denies symptoms of A-fib -Recommended to continue current medication  Heart Failure (Goal: manage symptoms and prevent exacerbations) -Controlled -Last ejection fraction: 55-60% (Date: June 2021) -HF type: Diastolic -NYHA Class: III (marked limitation of activity) -AHA HF Stage: C (Heart disease and symptoms present) -Current treatment: Entresto 24-26 mg twice daily  Imdur 120 mg daily  Metoprolol tartrate 100 mg twice daily -Medications previously tried: Furosemide  -Current home BP/HR readings: 110-140s/60-70s  -Recommended to continue current medication  Depression/Anxiety (Goal: maintain symptom remission) -Uncontrolled -Current treatment: Sertraline 100 mg daily  -Medications previously tried/failed: NA -PHQ9: 0 -GAD7: 2 -Continue current medications  GERD with dysphagia (Goal: prevent heartburn or reflux symptoms) -Controlled -Current treatment  Pantoprazole 40 mg daily   -Medications previously tried: Sucralfate,  -Continue current medications  Patient Goals/Self-Care Activities Over the next 90 days, patient will:  - check glucose daily, before breakfast, document, and provide at future appointments check blood pressure weekly, document, and provide at future appointments  Follow Up Plan: Telephone follow up appointment with care management team member scheduled for:  10/27/2021 at 1:00 PM

## 2021-11-06 ENCOUNTER — Ambulatory Visit: Payer: Self-pay

## 2021-11-11 ENCOUNTER — Ambulatory Visit: Payer: Self-pay

## 2021-11-11 NOTE — Telephone Encounter (Signed)
  Chief Complaint: chest pain , sweating, cough  Symptoms: see above, c/o blood sugar too low. Last checked 1 hour ago for 128.  Frequency: this am  Pertinent Negatives: Patient denies difficulty breathing  Disposition: '[x]'$ ED /'[]'$ Urgent Care (no appt availability in office) / '[]'$ Appointment(In office/virtual)/ '[]'$  [place] [facility]/ '[]'$ Home Care/ '[x]'$ Refused Recommended Disposition /'[]'$ [place] [facility]/ '[]'$  Follow-up with PCP Additional Notes:   Instructed to call 911 and patient refused. Report last time call EMS she was in hospital 14 days. Reports she will call her daughter. Requesting to see PCP today . Please advise. [place] notified refusal to call 911.        Reason for Disposition  Visible sweat on face or sweat dripping down face  Answer Assessment - Initial Assessment Questions 1. LOCATION: "Where does it hurt?"       Chest pressure  2. RADIATION: "Does the pain go anywhere else?" (e.g., into neck, jaw, arms, back)     na 3. ONSET: "When did the chest pain begin?" (Minutes, hours or days)      na 4. PATTERN: "Does the pain come and go, or has it been constant since it started?"  "Does it get worse with exertion?"      na 5. DURATION: "How long does it last" (e.g., seconds, minutes, hours)     na 6. SEVERITY: "How bad is the pain?"  (e.g., Scale 1-10; mild, moderate, or severe)    - MILD (1-3): doesn't interfere with normal activities     - MODERATE (4-7): interferes with normal activities or awakens from sleep    - SEVERE (8-10): excruciating pain, unable to do any normal activities       Pressure in chest  7. CARDIAC RISK FACTORS: "Do you have any history of heart problems or risk factors for heart disease?" (e.g., angina, prior heart attack; diabetes, high blood pressure, high cholesterol, smoker, or strong family history of heart disease)     See hx 8. PULMONARY RISK FACTORS: "Do you have any history of lung disease?"  (e.g., blood clots in lung, asthma,  emphysema, birth control pills)     na 9. CAUSE: "What do you think is causing the chest pain?"     Na  10. OTHER SYMPTOMS: "Do you have any other symptoms?" (e.g., dizziness, nausea, vomiting, sweating, fever, difficulty breathing, cough)       Sweating , cough . Chest pressure 11. PREGNANCY: "Is there any chance you are pregnant?" "When was your last menstrual period?"       na  Protocols used: Chest Pain-A-AH

## 2021-11-11 NOTE — Telephone Encounter (Signed)
Scheduled appointment

## 2021-11-11 NOTE — Telephone Encounter (Signed)
Patient called, left VM to return the call to the office to discuss symptoms with a nurse.    Summary: Blood Sugar   Patient called in to give a message to Candace [name] a message that her blood sugar has been running around 115-130 and when they get that low she gets the shakes. Please follow up with patient.

## 2021-11-11 NOTE — Telephone Encounter (Signed)
Summary: Blood Sugar   Patient called in to give a message to Candace [name] a message that her blood sugar has been running around 115-130 and when they get that low she gets the shakes. Please follow up with patient.       [place] pt - [id number]

## 2021-11-12 ENCOUNTER — Ambulatory Visit
Admission: RE | Admit: 2021-11-12 | Discharge: 2021-11-12 | Disposition: A | Discharge status: Home / Self Care | Payer: 59 | Source: Ambulatory Visit | Attending: Family Medicine | Admitting: Family Medicine

## 2021-11-12 ENCOUNTER — Ambulatory Visit
Admission: RE | Admit: 2021-11-12 | Discharge: 2021-11-12 | Disposition: A | Discharge status: Home / Self Care | Payer: 59 | Attending: Family Medicine | Admitting: Family Medicine

## 2021-11-12 ENCOUNTER — Encounter: Payer: Self-pay | Admitting: Family Medicine

## 2021-11-12 ENCOUNTER — Ambulatory Visit (INDEPENDENT_AMBULATORY_CARE_PROVIDER_SITE_OTHER): Payer: 59 | Admitting: Family Medicine

## 2021-11-12 VITALS — BP 104/55 | HR 84 | Resp 16 | Wt 173.0 lb

## 2021-11-12 DIAGNOSIS — E669 Obesity, unspecified: Secondary | ICD-10-CM

## 2021-11-12 DIAGNOSIS — F419 Anxiety disorder, unspecified: Secondary | ICD-10-CM

## 2021-11-12 DIAGNOSIS — I5032 Chronic diastolic (congestive) heart failure: Secondary | ICD-10-CM

## 2021-11-12 DIAGNOSIS — J189 Pneumonia, unspecified organism: Secondary | ICD-10-CM | POA: Diagnosis not present

## 2021-11-12 DIAGNOSIS — E1142 Type 2 diabetes mellitus with diabetic polyneuropathy: Secondary | ICD-10-CM

## 2021-11-12 DIAGNOSIS — Z951 Presence of aortocoronary bypass graft: Secondary | ICD-10-CM

## 2021-11-12 DIAGNOSIS — I480 Paroxysmal atrial fibrillation: Secondary | ICD-10-CM

## 2021-11-12 DIAGNOSIS — R051 Acute cough: Secondary | ICD-10-CM | POA: Insufficient documentation

## 2021-11-12 DIAGNOSIS — Z794 Long term (current) use of insulin: Secondary | ICD-10-CM | POA: Diagnosis not present

## 2021-11-12 DIAGNOSIS — F4320 Adjustment disorder, unspecified: Secondary | ICD-10-CM

## 2021-11-12 LAB — POCT GLYCOSYLATED HEMOGLOBIN (HGB A1C)
Est. average glucose Bld gHb Est-mCnc: 183
Hemoglobin A1C: 8 % — AB (ref 4.0–5.6)

## 2021-11-12 MED ORDER — AZITHROMYCIN 250 MG PO TABS: ORAL_TABLET | ORAL | 0 refills | Status: AC

## 2021-11-12 NOTE — Progress Notes (Signed)
Established patient visit  I,[name],acting as a scribe for [name], MD.,have documented all relevant documentation on the behalf of [name], MD,as directed by  [name], MD while in the presence of [name], MD.   Patient: Candace [name]   DOB: 10-Jan-1951   71 y.o. Female  MRN: [id number] Visit Date: 11/12/2021  Today's healthcare provider: [name], MD   No chief complaint on file.  Subjective    HPI  Patient's blood sugars have been running 115-130. Patient states she gets the shakes when her sugars are this low.  Patient admits to chronic anxiety.  It appears worse lately. Had no shortness of breath but has a chronic cough that is intermittent.  This been going on for a couple of weeks.  No PND or orthopnea.  Diabetes Mellitus Type II, Follow-up  Lab Results  Component Value Date   HGBA1C 8.0 (A) 11/12/2021   HGBA1C 8.2 (H) 07/30/2021   HGBA1C 8.5 (H) 03/26/2021   Wt Readings from Last 3 Encounters:  11/12/21 173 lb (78.5 kg)  09/30/21 171 lb (77.6 kg)  09/30/21 171 lb (77.6 kg)   Last seen for diabetes 3 months ago.   Home blood sugar records: fasting range: 115-130  Episodes of hypoglycemia? No home blood sugars 115-130, makes patient feels shaky   Most Recent Eye Exam: sent request for most recent eye exam  Pertinent Labs: Lab Results  Component Value Date   CHOL 164 08/11/2020   HDL 57 08/11/2020   LDLCALC 90 08/11/2020   TRIG 94 08/11/2020   CHOLHDL 2.9 08/11/2020   Lab Results  Component Value Date   NA 140 09/07/2021   K 4.0 09/07/2021   CREATININE 0.94 09/07/2021   EGFR 65 09/07/2021   MICROALBUR 50 08/11/2020   LABMICR See below: 12/24/2014     ---------------------------------------------------------------------------------------------------   Medications: Outpatient Medications Prior to Visit  Medication Sig   acetaminophen (TYLENOL) 325 MG tablet Take 650 mg by mouth every 6  (six) hours as needed for moderate pain or headache.    albuterol (VENTOLIN HFA) 108 (90 Base) MCG/ACT inhaler Inhale 2 puffs into the lungs every 6 (six) hours as needed for wheezing or shortness of breath.   ALPRAZolam (XANAX) 0.25 MG tablet Take 1 tablet (0.25 mg total) by mouth every 8 (eight) hours as needed for anxiety.   Blood Glucose Monitoring Suppl (ONETOUCH VERIO) w/Device KIT Use daily to check blood sugar   clopidogrel (PLAVIX) 75 MG tablet Take 1 tablet (75 mg total) by mouth daily.   COMFORT EZ PEN NEEDLES 32G X 4 MM MISC Use to inject insulin daily   ELIQUIS 5 MG TABS tablet Take 5 mg by mouth 2 (two) times daily.   empagliflozin (JARDIANCE) 25 MG TABS tablet Take 1 tablet (25 mg total) by mouth daily.   ezetimibe (ZETIA) 10 MG tablet Take 1 tablet (10 mg total) by mouth daily.   glucose blood test strip Use to check blood sugars daily as instructed   insulin glargine (LANTUS SOLOSTAR) 100 UNIT/ML Solostar Pen Inject 12 Units into the skin at bedtime.   isosorbide mononitrate (IMDUR) 60 MG 24 hr tablet Take 1 tablet (60 mg total) by mouth daily.   Lancets (ONETOUCH DELICA PLUS [id number]) MISC USE UP TO 4 TIMES DAILY AS DIRECTED   loperamide (IMODIUM) 2 MG capsule Take 2 mg by mouth daily as needed for diarrhea or loose stools.   metoprolol  tartrate (LOPRESSOR) 100 MG tablet Take 100 mg by mouth 2 (two) times daily.   nitroGLYCERIN (NITROSTAT) 0.4 MG SL tablet Place 1 tablet (0.4 mg total) under the tongue every 5 (five) minutes as needed for chest pain.   nystatin (NYSTATIN) powder Apply 1 application topically daily. (Patient taking differently: Apply 1 application  topically as needed.)   ondansetron (ZOFRAN-ODT) 8 MG disintegrating tablet TAKE ONE TABLET BY MOUTH EVERY 8 HOURS AS NEEDED FOR NAUSEA AND VOMITING   pantoprazole (PROTONIX) 20 MG tablet Take 1 tablet (20 mg total) by mouth 2 (two) times daily.   rosuvastatin (CRESTOR) 40 MG tablet Take 1 tablet (40 mg total) by mouth  daily.   sacubitril-valsartan (ENTRESTO) 24-26 MG Take 1 tablet by mouth daily.   sertraline (ZOLOFT) 100 MG tablet Take 1 tablet (100 mg total) by mouth daily.   TRULICITY 1.5 BZ/2.0EY SOPN INJECT 1.5 MG into THE SKIN ONCE A WEEK   No facility-administered medications prior to visit.    Review of Systems  Last hemoglobin A1c Lab Results  Component Value Date   HGBA1C 8.0 (A) 11/12/2021       Objective    BP (!) 104/55 (BP Location: Left Arm, Patient Position: Sitting, Cuff Size: Normal)   Pulse 84   Resp 16   Wt 173 lb (78.5 kg)   SpO2 98%   BMI 31.64 kg/m  BP Readings from Last 3 Encounters:  11/12/21 (!) 104/55  09/30/21 (!) 100/53  09/30/21 110/60   Wt Readings from Last 3 Encounters:  11/12/21 173 lb (78.5 kg)  09/30/21 171 lb (77.6 kg)  09/30/21 171 lb (77.6 kg)      Physical Exam Vitals and nursing note reviewed.  Constitutional:      Appearance: Normal appearance. She is normal weight.  HENT:     Right Ear: Tympanic membrane normal.     Left Ear: Tympanic membrane normal.     Nose: Nose normal.     Mouth/Throat:     Mouth: Mucous membranes are moist.     Pharynx: Oropharynx is clear.  Eyes:     Conjunctiva/sclera: Conjunctivae normal.  Cardiovascular:     Rate and Rhythm: Normal rate and regular rhythm.     Pulses: Normal pulses.     Heart sounds: Normal heart sounds.  Pulmonary:     Effort: Pulmonary effort is normal.     Breath sounds: Normal breath sounds.  Abdominal:     General: Bowel sounds are normal.     Palpations: Abdomen is soft.  Musculoskeletal:     Cervical back: Neck supple.  Skin:    General: Skin is warm and dry.  Neurological:     Mental Status: She is alert.  Psychiatric:        Mood and Affect: Mood normal.        Behavior: Behavior normal.        Thought Content: Thought content normal.        Judgment: Judgment normal.       Results for orders placed or performed in visit on 11/12/21  POCT glycosylated  hemoglobin (Hb A1C)  Result Value Ref Range   Hemoglobin A1C 8.0 (A) 4.0 - 5.6 %   Est. average glucose Bld gHb Est-mCnc 183     Assessment & Plan     1. Type 2 diabetes mellitus with diabetic polyneuropathy, with long-term current use of insulin ([place]) Patient is not having hypoglycemia.  Patient A1c is improved from 8.2-8.0 with no  hypoglycemia.  No changes in care presently. - POCT glycosylated hemoglobin (Hb A1C)  2. Acute cough  - DG Chest 2 View - azithromycin (ZITHROMAX) 250 MG tablet; Take 2 tablets on day 1, then 1 tablet daily on days 2 through 5  Dispense: 6 tablet; Refill: 0  3. Walking pneumonia With Z-Pak.  Obtain chest x-ray  4. Chronic diastolic CHF (congestive heart failure) ([place]) I do not think this is heart failure contributing to the cough.  Check chest x-ray On Entresto 5. Paroxysmal atrial fibrillation (HCC) On Eliquis  6. Adjustment disorder, unspecified type Depression and anxiety  7. Obesity (BMI 30-39.9)   8. S/P CABG x 4 All risk factors treated  9. Anxiety Ager issue ongoing for this patient.   No follow-ups on file.      I, [name], MD, have reviewed all documentation for this visit. The documentation on 11/17/21 for the exam, diagnosis, procedures, and orders are all accurate and complete.    Aisa Schoeppner Cranford Mon, MD  [facility] [phone] (phone) [phone] (fax)  [place]

## 2021-11-13 ENCOUNTER — Encounter: Payer: Self-pay | Admitting: Family Medicine

## 2021-11-17 ENCOUNTER — Ambulatory Visit: Payer: 59 | Admitting: Family Medicine

## 2021-12-04 ENCOUNTER — Ambulatory Visit: Payer: Self-pay

## 2021-12-07 ENCOUNTER — Other Ambulatory Visit: Payer: Self-pay | Admitting: Family Medicine

## 2021-12-07 DIAGNOSIS — E1142 Type 2 diabetes mellitus with diabetic polyneuropathy: Secondary | ICD-10-CM

## 2021-12-07 NOTE — Telephone Encounter (Signed)
Patient would like a refill of the following:  Requested Prescriptions   Pending Prescriptions Disp Refills   Dulaglutide (TRULICITY) 1.5 SU/8.6YG SOPN 2 mL 0    Sig: Inject 1.5 mg into the skin once a week.   Sent to: [facility] - [place], [place] - [address] Phone:  [phone]  Fax:  [phone]

## 2021-12-08 MED ORDER — TRULICITY 1.5 MG/0.5ML ~~LOC~~ SOAJ: 1.5000 mg | SUBCUTANEOUS | 1 refills | Status: DC

## 2021-12-08 NOTE — Telephone Encounter (Signed)
Requested Prescriptions  Pending Prescriptions Disp Refills  . Dulaglutide (TRULICITY) 1.5 JQ/3.0SP SOPN 2 mL 0    Sig: Inject 1.5 mg into the skin once a week.     Endocrinology:  Diabetes - GLP-1 Receptor Agonists Failed - 12/07/2021 10:29 AM      Failed - HBA1C is between 0 and 7.9 and within 180 days    Hemoglobin A1C  Date Value Ref Range Status  11/12/2021 8.0 (A) 4.0 - 5.6 % Final  07/10/2019 9.2  Final   Hgb A1c MFr Bld  Date Value Ref Range Status  07/30/2021 8.2 (H) 4.8 - 5.6 % Final    Comment:             Prediabetes: 5.7 - 6.4          Diabetes: >6.4          Glycemic control for adults with diabetes: <7.0          Passed - Valid encounter within last 6 months    Recent Outpatient Visits          3 weeks ago Type 2 diabetes mellitus with diabetic polyneuropathy, with long-term current use of insulin [facility])   [facility] [name]., MD   2 months ago Pain of right hip   [facility] [name]., MD   3 months ago [place] Rosanna Randy, Retia Passe., MD   4 months ago Type 2 diabetes mellitus with diabetic polyneuropathy, with long-term current use of insulin [facility])   [facility] [name]., MD   5 months ago Left hip pain   [facility] Mikey Kirschner, PA-C      Future Appointments            In 6 days [name]., MD [facility], [place]   In 3 weeks Gollan, Kathlene November, MD [place] [place]

## 2021-12-14 ENCOUNTER — Ambulatory Visit: Payer: 59 | Admitting: Family Medicine

## 2021-12-17 ENCOUNTER — Ambulatory Visit: Payer: 59 | Admitting: Family Medicine

## 2021-12-30 ENCOUNTER — Telehealth: Payer: Self-pay

## 2021-12-30 NOTE — Progress Notes (Signed)
Chronic Care Management Pharmacy Assistant   Name: Candace [name]  MRN: [id number] DOB: 21-Jan-1951  Reason for Encounter: Medication Review/Medication Coordination Call.   Recent office visits:  11/12/2021 Dr. Rosanna Randy MD (PCP) Start azithromycin 250 MG tablet 10/21/2021 Felecia McCray RN (CCM) No Medication Changes noted  Recent consult visits:  12/10/2021 Dr. Scarlette Ar MD (Cardiology)  No Medication Changes noted  Hospital visits:  None in previous 6 months  Medications: Outpatient Encounter Medications as of 12/30/2021  Medication Sig Note   acetaminophen (TYLENOL) 325 MG tablet Take 650 mg by mouth every 6 (six) hours as needed for moderate pain or headache.     albuterol (VENTOLIN HFA) 108 (90 Base) MCG/ACT inhaler Inhale 2 puffs into the lungs every 6 (six) hours as needed for wheezing or shortness of breath.    ALPRAZolam (XANAX) 0.25 MG tablet Take 1 tablet (0.25 mg total) by mouth every 8 (eight) hours as needed for anxiety.    Blood Glucose Monitoring Suppl (ONETOUCH VERIO) w/Device KIT Use daily to check blood sugar    clopidogrel (PLAVIX) 75 MG tablet Take 1 tablet (75 mg total) by mouth daily.    COMFORT EZ PEN NEEDLES 32G X 4 MM MISC Use to inject insulin daily    Dulaglutide (TRULICITY) 1.5 YV/8.5FY SOPN Inject 1.5 mg into the skin once a week.    ELIQUIS 5 MG TABS tablet Take 5 mg by mouth 2 (two) times daily.    empagliflozin (JARDIANCE) 25 MG TABS tablet Take 1 tablet (25 mg total) by mouth daily.    ezetimibe (ZETIA) 10 MG tablet Take 1 tablet (10 mg total) by mouth daily.    glucose blood test strip Use to check blood sugars daily as instructed    insulin glargine (LANTUS SOLOSTAR) 100 UNIT/ML Solostar Pen Inject 12 Units into the skin at bedtime. 10/09/2021: Reports dose was increased to 14 units.   isosorbide mononitrate (IMDUR) 60 MG 24 hr tablet Take 1 tablet (60 mg total) by mouth daily. 10/21/2021: Reports having 120 mg dose on hand. Confirmed cutting dose in  half and taking 70m.   Lancets (ONETOUCH DELICA PLUS [id number] MISC USE UP TO 4 TIMES DAILY AS DIRECTED    loperamide (IMODIUM) 2 MG capsule Take 2 mg by mouth daily as needed for diarrhea or loose stools.    metoprolol tartrate (LOPRESSOR) 100 MG tablet Take 100 mg by mouth 2 (two) times daily.    nitroGLYCERIN (NITROSTAT) 0.4 MG SL tablet Place 1 tablet (0.4 mg total) under the tongue every 5 (five) minutes as needed for chest pain.    nystatin (NYSTATIN) powder Apply 1 application topically daily. (Patient taking differently: Apply 1 application  topically as needed.)    ondansetron (ZOFRAN-ODT) 8 MG disintegrating tablet TAKE ONE TABLET BY MOUTH EVERY 8 HOURS AS NEEDED FOR NAUSEA AND VOMITING    pantoprazole (PROTONIX) 20 MG tablet Take 1 tablet (20 mg total) by mouth 2 (two) times daily.    rosuvastatin (CRESTOR) 40 MG tablet Take 1 tablet (40 mg total) by mouth daily.    sacubitril-valsartan (ENTRESTO) 24-26 MG Take 1 tablet by mouth daily.    sertraline (ZOLOFT) 100 MG tablet Take 1 tablet (100 mg total) by mouth daily.    [DISCONTINUED] cetirizine (ZYRTEC) 5 MG tablet Take 2 tablets (10 mg total) by mouth daily.    No facility-administered encounter medications on file as of 12/30/2021.    Care Gaps: COVID 19 Vaccine (4- Booster) Ophthalmology Exam (Last Completed 06/28/2019)  Tetanus Vaccine (Last Completed 07/28/2010) Shingrix Vaccine (2 of 2) (Last Completed 06/14/2020) Foot Exam (Last Completed 10/27/2019) Diabetic Kidney Evaluation Influenza Vaccine   Star Rating Drugs: 10/16/2021 Rosuvastatin 40 mg last filled for a 90- Day supply at [facility] 10/14/2021 Jardiance 25 mg last filled for a 90-Day Supply at [facility] 76/80/8811 Trulicity 1.5 mg last filled for a 28-Day supply at [facility] 24-26 mg last filled 10/05/2021 [facility]   Medication Fill Gaps: None ID  Reviewed chart for medication changes ahead of medication  coordination call.  BP Readings from Last 3 Encounters:  11/12/21 (!) 104/55  09/30/21 (!) 100/53  09/30/21 110/60    Lab Results  Component Value Date   HGBA1C 8.0 (A) 11/12/2021     Patient obtains medications through [facility]  90 Days   Last adherence delivery included:  Patient states she was in rehab for about 28 days (she just got out on 09/07/2021), and received her medication through them.Patient reports the only thing she needs refill is her pen needles and Trulicity.Patient is asking if the Clinical Pharmacist can [facility] [place] on [address] since she is staying with her niece until she gets better. Patient reports she should be able to go home in a few weeks. Patient states she has about 30 day supply on hand as of 09/10/2021 of her other medications.Notified Clinical Pharmacist.   Patient declined medications last month: Pantoprazole 20 mg one tablet two times a day- breakfast, Evening meals Losartan 25 mg one tablet daily- Bedtime Eliquis 5 mg 1 tablet two times daily breakfast, evening meals Jardiance 25 mg 1 tablet daily before breakfast Rosuvastatin 40 mg 1 tablet daily - Breakfast Sertraline 100 mg 1 tablet daily breakfast  Metoprolol tartrate 100 mg 1 tablet 2 times daily Breakfast and Evening Meals Isosorbide Mononitrate 120 mg 1 tablet daily - Breakfast Ondansetron 8 mg PRN Nitroglycerin 0.4 mg PRN Onetouch Ultra Test Strip  Insulin Glargine 100 unit/ML Solostar Pen- Inject 14 units into the skin at bedtime ezetimibe 10 mg 1 tablet daily breakfast  Clopidogrel 75 mg 1 tablet daily breakfast   Patient is due for next adherence delivery on: 01/11/2022. Called patient and reviewed medications and coordinated delivery.  This delivery to include: Trulicity 1.5 mg Inject 1.5 mg into the skin once a week Insulin Glargine 100 unit/ML Solostar Pen- Inject 14 units into the skin at bedtime   Patient declined the following  medications: Patient reports she has abundance of supply since she was receiving her medication through Rehab.  Pantoprazole 20 mg one tablet two times a day- breakfast, Evening meals Losartan 25 mg one tablet daily- Bedtime Eliquis 5 mg 1 tablet two times daily breakfast, evening meals Jardiance 25 mg 1 tablet daily before breakfast Rosuvastatin 40 mg 1 tablet daily - Breakfast Sertraline 100 mg 1 tablet daily breakfast  Metoprolol tartrate 100 mg 1 tablet 2 times daily Breakfast and Evening Meals Isosorbide Mononitrate 120 mg 1 tablet daily - Breakfast Ondansetron 8 mg PRN Nitroglycerin 0.4 mg PRN Onetouch Ultra Test Strip  ezetimibe 10 mg 1 tablet daily breakfast  Clopidogrel 75 mg 1 tablet daily breakfast  Pen needles  32Gx4 MM  Patient needs refills for None ID.  Confirmed delivery date of 01/06/2022 ([facility]), advised patient that pharmacy will contact them the morning of delivery.  Patient reports she had four teeth pulled on Monday, and on Tuesday her face was swollen.Patient reports she  had symptoms of dizziness, lightheadedness, and she reach out to her dentist whom inform her to call her PCP.Patient reports she prefers not to see anyone but Dr. Rosanna Randy.Patient states she will go to urgent care to get seen.  [place] Pharmacist Assistant [phone]

## 2022-01-01 ENCOUNTER — Emergency Department: Payer: 59

## 2022-01-01 ENCOUNTER — Emergency Department
Admission: EM | Admit: 2022-01-01 | Discharge: 2022-01-01 | Disposition: A | Discharge status: Home / Self Care | Payer: 59 | Attending: Emergency Medicine | Admitting: Emergency Medicine

## 2022-01-01 ENCOUNTER — Encounter: Payer: Self-pay | Admitting: Emergency Medicine

## 2022-01-01 ENCOUNTER — Other Ambulatory Visit: Payer: Self-pay

## 2022-01-01 DIAGNOSIS — Z7901 Long term (current) use of anticoagulants: Secondary | ICD-10-CM | POA: Insufficient documentation

## 2022-01-01 DIAGNOSIS — Z20822 Contact with and (suspected) exposure to covid-19: Secondary | ICD-10-CM | POA: Insufficient documentation

## 2022-01-01 DIAGNOSIS — J3489 Other specified disorders of nose and nasal sinuses: Secondary | ICD-10-CM | POA: Diagnosis not present

## 2022-01-01 DIAGNOSIS — E119 Type 2 diabetes mellitus without complications: Secondary | ICD-10-CM | POA: Diagnosis not present

## 2022-01-01 DIAGNOSIS — R0602 Shortness of breath: Secondary | ICD-10-CM | POA: Insufficient documentation

## 2022-01-01 DIAGNOSIS — R251 Tremor, unspecified: Secondary | ICD-10-CM | POA: Diagnosis not present

## 2022-01-01 DIAGNOSIS — R519 Headache, unspecified: Secondary | ICD-10-CM | POA: Diagnosis present

## 2022-01-01 DIAGNOSIS — H538 Other visual disturbances: Secondary | ICD-10-CM | POA: Insufficient documentation

## 2022-01-01 DIAGNOSIS — Z7902 Long term (current) use of antithrombotics/antiplatelets: Secondary | ICD-10-CM | POA: Diagnosis not present

## 2022-01-01 LAB — COMPREHENSIVE METABOLIC PANEL
ALT: 12 U/L (ref 0–44)
AST: 18 U/L (ref 15–41)
Albumin: 4.2 g/dL (ref 3.5–5.0)
Alkaline Phosphatase: 103 U/L (ref 38–126)
Anion gap: 7 (ref 5–15)
BUN: 14 mg/dL (ref 8–23)
CO2: 25 mmol/L (ref 22–32)
Calcium: 9.1 mg/dL (ref 8.9–10.3)
Chloride: 108 mmol/L (ref 98–111)
Creatinine, Ser: 0.63 mg/dL (ref 0.44–1.00)
GFR, Estimated: >60 mL/min (ref >60)
Glucose, Bld: 112 mg/dL — ABNORMAL HIGH (ref 70–99)
Potassium: 3.7 mmol/L (ref 3.5–5.1)
Sodium: 140 mmol/L (ref 135–145)
Total Bilirubin: 0.6 mg/dL (ref 0.3–1.2)
Total Protein: 7.6 g/dL (ref 6.5–8.1)

## 2022-01-01 LAB — CBC WITH DIFFERENTIAL/PLATELET
Abs Immature Granulocytes: 0.02 10*3/uL (ref 0.00–0.07)
Basophils Absolute: 0 10*3/uL (ref 0.0–0.1)
Basophils Relative: 0 %
Eosinophils Absolute: 0.1 10*3/uL (ref 0.0–0.5)
Eosinophils Relative: 1 %
HCT: 42.5 % (ref 36.0–46.0)
Hemoglobin: 13.4 g/dL (ref 12.0–15.0)
Immature Granulocytes: 0 %
Lymphocytes Relative: 38 %
Lymphs Abs: 2.1 10*3/uL (ref 0.7–4.0)
MCH: 30.4 pg (ref 26.0–34.0)
MCHC: 31.5 g/dL (ref 30.0–36.0)
MCV: 96.4 fL (ref 80.0–100.0)
Monocytes Absolute: 0.4 10*3/uL (ref 0.1–1.0)
Monocytes Relative: 8 %
Neutro Abs: 3 10*3/uL (ref 1.7–7.7)
Neutrophils Relative %: 53 %
Platelets: 199 10*3/uL (ref 150–400)
RBC: 4.41 MIL/uL (ref 3.87–5.11)
RDW: 12.9 % (ref 11.5–15.5)
WBC: 5.7 10*3/uL (ref 4.0–10.5)
nRBC: 0 % (ref 0.0–0.2)

## 2022-01-01 LAB — SARS CORONAVIRUS 2 BY RT PCR: SARS Coronavirus 2 by RT PCR: NEGATIVE

## 2022-01-01 LAB — TROPONIN I (HIGH SENSITIVITY)
Troponin I (High Sensitivity): 13 ng/L (ref <18)
Troponin I (High Sensitivity): 15 ng/L (ref <18)

## 2022-01-01 LAB — SEDIMENTATION RATE: Sed Rate: 33 mm/hr — ABNORMAL HIGH (ref 0–30)

## 2022-01-01 LAB — BRAIN NATRIURETIC PEPTIDE: B Natriuretic Peptide: 32.4 pg/mL (ref 0.0–100.0)

## 2022-01-01 MED ORDER — ACETAMINOPHEN 325 MG PO TABS
650.0000 mg | ORAL_TABLET | Freq: Once | ORAL | Status: AC
  Administered 2022-01-01: 650 mg via ORAL
  Filled 2022-01-01: qty 2

## 2022-01-01 MED ORDER — FLUORESCEIN SODIUM 1 MG OP STRP
1.0000 | ORAL_STRIP | Freq: Once | OPHTHALMIC | Status: AC
  Administered 2022-01-01: 1 via OPHTHALMIC
  Filled 2022-01-01: qty 1

## 2022-01-01 MED ORDER — TETRACAINE HCL 0.5 % OP SOLN
1.0000 [drp] | Freq: Once | OPHTHALMIC | Status: AC
  Administered 2022-01-01: 1 [drp] via OPHTHALMIC
  Filled 2022-01-01: qty 4

## 2022-01-01 MED ORDER — IOHEXOL 350 MG/ML SOLN
75.0000 mL | Freq: Once | INTRAVENOUS | Status: AC | PRN
  Administered 2022-01-01: 75 mL via INTRAVENOUS

## 2022-01-01 MED ORDER — PREDNISONE 50 MG PO TABS: 50.0000 mg | ORAL_TABLET | Freq: Every day | ORAL | 0 refills | Status: DC

## 2022-01-01 MED ORDER — OXYCODONE HCL 5 MG PO TABS
2.5000 mg | ORAL_TABLET | Freq: Once | ORAL | Status: AC
  Administered 2022-01-01: 2.5 mg via ORAL
  Filled 2022-01-01: qty 1

## 2022-01-01 MED ORDER — DOXYCYCLINE MONOHYDRATE 100 MG PO TABS: 100.0000 mg | ORAL_TABLET | Freq: Two times a day (BID) | ORAL | 0 refills | Status: AC

## 2022-01-01 MED ORDER — OXYCODONE HCL 5 MG PO TABS: 2.5000 mg | ORAL_TABLET | Freq: Every evening | ORAL | 0 refills | Status: AC | PRN

## 2022-01-01 NOTE — ED Triage Notes (Signed)
Presents with headache  States she had couple of teeth pulled on monday  then developed h/a on Tuesday Denies any fever or trauma

## 2022-01-01 NOTE — ED Provider Notes (Signed)
[facility] Provider Note    Event Date/Time   First MD Initiated Contact with Patient 01/01/22 [phone]     (approximate)   History   Headache   HPI  Candace [name] is a 71 y.o. female with diabetes who comes in with concern for headache.  Patient reports a headache on the front of her head this been ongoing since Tuesday and gradually getting worse.  She reports that this started on Tuesday after having her teeth pulled on Monday.  She reports feeling like she has a lot of sinus pressure and pressure behind her right ear.  She denies any chest pain but does report some shortness of breath as well that is mild in nature.  Patient reports being on Eliquis and Plavix.  She has a history of atrial fibrillation on review of her cardiology note from 12/10/2021.  She reports being compliant with her medications.  She denies any known fevers.  She does report a little bit of pain inside of her throat as well.  She denies any recent antibiotic use.  Patient does report a little bit of blurred vision in her right eye but she is unable to give me a timeframe of how long this has been going on for.  She reports that she supposed to be wearing glasses but she is not wearing them.  Patient also does report a slight tremor that she has been having for years now since 2021 that her doctor told her they did not know what was causing it and now she does not have a primary care doctor   Physical Exam   Triage Vital Signs: ED Triage Vitals [01/01/22 0855]  Enc Vitals Group     BP      Pulse      Resp      Temp      Temp src      SpO2      Weight 173 lb 1 oz (78.5 kg)     Height _0  (1.575 m)     Head Circumference      Peak Flow      Pain Score      Pain Loc      Pain Edu?      Excl. in [place]?     Most recent vital signs: Vitals:   01/01/22 0953  BP: (!) 146/89  Pulse: 88  Resp: 18  Temp: 98 F (36.7 C)  SpO2: 98%     General: Awake, no distress.  CV:  Good  peripheral perfusion.  Resp:  Normal effort.  Abd:  No distention.  Other:  Pupils are reactive bilaterally.  Extraocular movements are intact.  No tenderness along the temporal artery.  Some frontal sinus pressure tenderness.  Tenderness inside of her mouth along her upper oral ridge but no fluctuant abscess felt.  No swelling in the face noted.  Cranial nerves II through XII appear intact.  Equal strength in arms and legs.  Vision is 20/25 on the right and 20/20 on the left.  Fluorescein stain negative.  Pressure in the right eye is 17. Slight tremor noted to her bilateral hands with shaky voice noted.  ED Results / Procedures / Treatments   Labs (all labs ordered are listed, but only abnormal results are displayed) Labs Reviewed  SARS CORONAVIRUS 2 BY RT PCR  CBC WITH DIFFERENTIAL/PLATELET  COMPREHENSIVE METABOLIC PANEL  BRAIN NATRIURETIC PEPTIDE  TROPONIN I (HIGH SENSITIVITY)     EKG  My interpretation of EKG:  Sinus rate of 84 without any ST elevation, left bundle branch block with T wave inversions in lead III, aVF, V5 and V6.  Inversions in V5 and V6 are at baseline.  RADIOLOGY I have reviewed the CT head personally interpreted no evidence of intracranial hemorrhage  PROCEDURES:  Critical Care performed: No  Procedures   MEDICATIONS ORDERED IN ED: Medications  oxyCODONE (Oxy IR/ROXICODONE) immediate release tablet 2.5 mg (2.5 mg Oral Given 01/01/22 1052)  fluorescein ophthalmic strip 1 strip (1 strip Right Eye Given by Other 01/01/22 1216)  tetracaine (PONTOCAINE) 0.5 % ophthalmic solution 1 drop (1 drop Right Eye Given by Other 01/01/22 1217)     IMPRESSION / MDM / ASSESSMENT AND PLAN / ED COURSE  I reviewed the triage vital signs and the nursing notes.   Patient's presentation is most consistent with acute presentation with potential threat to life or bodily function.   Patient comes in with headaches given patient is on Eliquis will get CT head to make sure no  evidence of intercranial hemorrhage.  Given the shortness of breath we will get some basic labs to evaluate for Electra abnormalities, AKI, ACS, chest x-ray evaluate for any pneumonia.  Her shortness of breath is very minimal and her oxygen levels are normal at 98% low suspicion for large PE given patient is already on Eliquis.  We will get COVID test to evaluate for shortness of breath.  Given patient's examination I suspect that some of this is related to a headache from her teeth being pulled versus some sinusitis.  We will give a small dose of oxycodone given patient does report having a ride home and that she is already taken Tylenol this morning and cannot take ibuprofen doing due to being on Eliquis.  CT imaging of head was reassuring but patient has a hard time explaining the vision changes in her right eye will get CT angio just to ensure that there is no evidence of any thrombus.  We will also get ESR to evaluate for temporal arteritis.  COVID test is negative.  CBC reassuring CMP reassuring troponin is negative will get repeat given some EKG changes but she denies any chest pain or shortness of breath she reports is very mild in nature.Marland Kitchen  BNP normal.  Patient reports a lot of relief of the pain with the oxycodone.  We discussed the risk of the oxycodone given increase in sedation.  We will give her a few pills to take at half of them at nighttime which she agreed to given she cannot take significant ibuprofen given she is on a blood thinner and reports no relief with the Tylenol that she is taking.  Patient began with oncoming team pending CTA, ESR and final disposition   FINAL CLINICAL IMPRESSION(S) / ED DIAGNOSES   Final diagnoses:  Intractable headache, unspecified chronicity pattern, unspecified headache type     Rx / DC Orders   ED Discharge Orders          Ordered    oxyCODONE (ROXICODONE) 5 MG immediate release tablet  At bedtime PRN        01/01/22 1325    doxycycline  (ADOXA) 100 MG tablet  2 times daily        01/01/22 1325             Note:  This document was prepared using Dragon voice recognition software and may include unintentional dictation errors.   Vanessa [place] Hunt, MD 01/01/22  [place]

## 2022-01-01 NOTE — Discharge Instructions (Addendum)
Take tylenol 1g every 8 hours for pain. We have started you on some antibiotics to help with possible sinusitis--please call the [facility] to make a follow-up appointment for your vision changes.  Please return to the ER if develop worsening symptoms or any other concerns and call the neurology to make a neurology appointment for the shakes DO NOT TAKE THE OXYCODONE unless at night-time and dont use the same time as your anxiety medication.   [place] 4.7 (1.1K)  Ophthalmology clinic [place], [place]  [phone]  Do not take opioid at same time as the aprazolam as these can cause sedation and increase risk of fall--- only take as needed at night-time to help with headache.   Take oxycodone as prescribed. Do not drink alcohol, drive or participate in any other potentially dangerous activities while taking this medication as it may make you sleepy. Do not take this medication with any other sedating medications, either prescription or over-the-counter. If you were prescribed Percocet or Vicodin, do not take these with acetaminophen (Tylenol) as it is already contained within these medications.  This medication is an opiate (or narcotic) pain medication and can be habit forming. Use it as little as possible to achieve adequate pain control. Do not use or use it with extreme caution if you have a history of opiate abuse or dependence. If you are on a pain contract with your primary care doctor or a pain specialist, be sure to let them know you were prescribed this medication today from the Emergency Department. This medication is intended for your use only - do not give any to anyone else and keep it in a secure place where nobody else, especially children, have access to it.

## 2022-01-02 ENCOUNTER — Other Ambulatory Visit: Payer: Self-pay | Admitting: Family Medicine

## 2022-01-02 DIAGNOSIS — F419 Anxiety disorder, unspecified: Secondary | ICD-10-CM

## 2022-01-02 DIAGNOSIS — E1169 Type 2 diabetes mellitus with other specified complication: Secondary | ICD-10-CM

## 2022-01-04 ENCOUNTER — Other Ambulatory Visit: Payer: Self-pay | Admitting: Family Medicine

## 2022-01-04 ENCOUNTER — Other Ambulatory Visit: Payer: Self-pay | Admitting: Physician Assistant

## 2022-01-04 ENCOUNTER — Ambulatory Visit: Payer: 59 | Admitting: Cardiovascular Disease

## 2022-01-04 DIAGNOSIS — E1169 Type 2 diabetes mellitus with other specified complication: Secondary | ICD-10-CM

## 2022-01-04 DIAGNOSIS — Z794 Long term (current) use of insulin: Secondary | ICD-10-CM

## 2022-01-04 NOTE — Telephone Encounter (Signed)
Requested Prescriptions  Pending Prescriptions Disp Refills  . ALPRAZolam (XANAX) 0.25 MG tablet [Pharmacy Med Name: alprazolam 0.25 mg tablet] 90 tablet 5    Sig: TAKE ONE TABLET BY MOUTH every EIGHT hours AS NEEDED FOR ANXIETY     Not Delegated - Psychiatry: Anxiolytics/Hypnotics 2 Failed - 01/02/2022  8:26 AM      Failed - This refill cannot be delegated      Failed - Urine Drug Screen completed in last 360 days      Passed - Patient is not pregnant      Passed - Valid encounter within last 6 months    Recent Outpatient Visits          1 month ago Type 2 diabetes mellitus with diabetic polyneuropathy, with long-term current use of insulin [facility])   [facility] [name]., MD   3 months ago Pain of right hip   [facility] [name]., MD   3 months ago [place] Rosanna Randy, Retia Passe., MD   5 months ago Type 2 diabetes mellitus with diabetic polyneuropathy, with long-term current use of insulin [facility])   [facility] [name]., MD   6 months ago Left hip pain   [facility] Mikey Kirschner, PA-C      Future Appointments            In 4 days Thedore Mins, Ria Comment, PA-C [facility], [facility]           . rosuvastatin (CRESTOR) 40 MG tablet [Pharmacy Med Name: rosuvastatin 40 mg tablet] 90 tablet 0    Sig: TAKE ONE TABLET BY MOUTH ONCE DAILY     Cardiovascular:  Antilipid - Statins 2 Failed - 01/02/2022  8:26 AM      Failed - Lipid Panel in normal range within the last 12 months    Cholesterol, Total  Date Value Ref Range Status  08/11/2020 164 100 - 199 mg/dL Final   Cholesterol  Date Value Ref Range Status  07/14/2013 214 (H) 0 - 200 mg/dL Final   Ldl Cholesterol, Calc  Date Value Ref Range Status  07/14/2013 154 (H) 0 - 100 mg/dL Final   LDL Chol Calc (NIH)  Date Value Ref Range Status  08/11/2020 90 0 - 99 mg/dL Final   HDL Cholesterol   Date Value Ref Range Status  07/14/2013 36 (L) 40 - 60 mg/dL Final   HDL  Date Value Ref Range Status  08/11/2020 57 >39 mg/dL Final   Triglycerides  Date Value Ref Range Status  08/11/2020 94 0 - 149 mg/dL Final  07/14/2013 118 0 - 200 mg/dL Final         Passed - Cr in normal range and within 360 days    Creat  Date Value Ref Range Status  03/24/2017 1.39 (H) 0.50 - 0.99 mg/dL Final    Comment:    For patients >42 years of age, the reference limit for Creatinine is approximately 13% higher for people identified as African-American. .    Creatinine, Ser  Date Value Ref Range Status  01/01/2022 0.63 0.44 - 1.00 mg/dL Final         Passed - Patient is not pregnant      Passed - Valid encounter within last 12 months    Recent Outpatient Visits          1 month ago Type 2 diabetes mellitus with diabetic polyneuropathy,  with long-term current use of insulin [facility])   [facility] [name]., MD   3 months ago Pain of right hip   [facility] [name]., MD   3 months ago [place] [name]., MD   5 months ago Type 2 diabetes mellitus with diabetic polyneuropathy, with long-term current use of insulin [facility])   [facility] [name]., MD   6 months ago Left hip pain   [facility] Mikey Kirschner, PA-C      Future Appointments            In 4 days Mikey Kirschner, PA-C [place], [place]

## 2022-01-04 NOTE — Telephone Encounter (Signed)
Unable to refill per protocol, Rx request is too soon. Last refill was 07/30/21 for 90 and 5 RF. E-Prescribing Status: Receipt confirmed by pharmacy (07/30/2021 2:41 PM EDT). Will refuse.  Requested Prescriptions  Pending Prescriptions Disp Refills  . ALPRAZolam (XANAX) 0.25 MG tablet [Pharmacy Med Name: alprazolam 0.25 mg tablet] 90 tablet 5    Sig: TAKE ONE TABLET BY MOUTH every EIGHT hours AS NEEDED FOR ANXIETY     Not Delegated - Psychiatry: Anxiolytics/Hypnotics 2 Failed - 01/02/2022  8:26 AM      Failed - This refill cannot be delegated      Failed - Urine Drug Screen completed in last 360 days      Passed - Patient is not pregnant      Passed - Valid encounter within last 6 months    Recent Outpatient Visits          1 month ago Type 2 diabetes mellitus with diabetic polyneuropathy, with long-term current use of insulin ([place])   [facility] [name]., MD   3 months ago Pain of right hip   [facility] [name]., MD   3 months ago [place] Rosanna Randy, Retia Passe., MD   5 months ago Type 2 diabetes mellitus with diabetic polyneuropathy, with long-term current use of insulin [facility])   [facility] [name]., MD   6 months ago Left hip pain   [facility] Mikey Kirschner, PA-C      Future Appointments            In 4 days Drubel, Ria Comment, PA-C [facility], [facility]           Signed Prescriptions Disp Refills   rosuvastatin (CRESTOR) 40 MG tablet 90 tablet 0    Sig: TAKE ONE TABLET BY MOUTH ONCE DAILY     Cardiovascular:  Antilipid - Statins 2 Failed - 01/02/2022  8:26 AM      Failed - Lipid Panel in normal range within the last 12 months    Cholesterol, Total  Date Value Ref Range Status  08/11/2020 164 100 - 199 mg/dL Final   Cholesterol  Date Value Ref Range Status  07/14/2013 214 (H) 0 - 200 mg/dL Final   Ldl Cholesterol, Calc  Date  Value Ref Range Status  07/14/2013 154 (H) 0 - 100 mg/dL Final   LDL Chol Calc (NIH)  Date Value Ref Range Status  08/11/2020 90 0 - 99 mg/dL Final   HDL Cholesterol  Date Value Ref Range Status  07/14/2013 36 (L) 40 - 60 mg/dL Final   HDL  Date Value Ref Range Status  08/11/2020 57 >39 mg/dL Final   Triglycerides  Date Value Ref Range Status  08/11/2020 94 0 - 149 mg/dL Final  07/14/2013 118 0 - 200 mg/dL Final         Passed - Cr in normal range and within 360 days    Creat  Date Value Ref Range Status  03/24/2017 1.39 (H) 0.50 - 0.99 mg/dL Final    Comment:    For patients >65 years of age, the reference limit for Creatinine is approximately 13% higher for people identified as African-American. .    Creatinine, Ser  Date Value Ref Range Status  01/01/2022 0.63 0.44 - 1.00 mg/dL Final         Passed - Patient is not pregnant      Passed - Valid  encounter within last 12 months    Recent Outpatient Visits          1 month ago Type 2 diabetes mellitus with diabetic polyneuropathy, with long-term current use of insulin [facility])   [facility] [name][facility] MD   3 months ago Pain of right hip   [facility] [name]., MD   3 months ago [place] Rosanna Randy, Retia Passe., MD   5 months ago Type 2 diabetes mellitus with diabetic polyneuropathy, with long-term current use of insulin [facility])   [facility] [name]., MD   6 months ago Left hip pain   [facility] Mikey Kirschner, PA-C      Future Appointments            In 4 days Thedore Mins, Ria Comment, PA-C [facility], [place]

## 2022-01-07 NOTE — Progress Notes (Signed)
I,Monet [place] Baller,acting as a scribe for [employer], PA-C.,have documented all relevant documentation on the behalf of Mikey Kirschner, PA-C,as directed by  Mikey Kirschner, PA-C while in the presence of Mikey Kirschner, PA-C.   Established patient visit   Patient: Candace [name]   DOB: 09/27/1950   71 y.o. Female  MRN: [id number] Visit Date: 01/08/2022  Today's healthcare provider: Mikey Kirschner, PA-C   Chief Complaint  Patient presents with   Follow-up   Subjective    HPI  Follow up ER visit  Patient was seen in ER for intractable headache on 01/01/2022. Treatment for this included oxycodone 5 mg and Doxycycline 100 mg. She reports excellent compliance with treatment. She reports this condition is Improved. Reports [place] is a little better.  Pt reports recent dental surgery, headache started day after procedure. Pt had various tests completed, CT head w/o bleed, CTA head and neck with 50-60% stenosis within the proximal right ICA. Pt was tx with prednisone and doxycycline for suspected sinusitis causing headache. Today she reports her her headache symptoms have improved.   She reports even before her headache started, struggling w/ increased tremors, shakiness, feeling unstable on her feet. Reports R eye blurriness and occasional pain. Reports not seeing an eye doctor recently. She has a hx of TIA, NSTEMI, and afib, is anticoagulated. Denies any residual symptoms after TIA.  Medications: Outpatient Medications Prior to Visit  Medication Sig   acetaminophen (TYLENOL) 325 MG tablet Take 650 mg by mouth every 6 (six) hours as needed for moderate pain or headache.    albuterol (VENTOLIN HFA) 108 (90 Base) MCG/ACT inhaler Inhale 2 puffs into the lungs every 6 (six) hours as needed for wheezing or shortness of breath.   ALPRAZolam (XANAX) 0.25 MG tablet Take 1 tablet (0.25 mg total) by mouth every 8 (eight) hours as needed for anxiety.   Blood Glucose Monitoring Suppl  (ONETOUCH VERIO) w/Device KIT Use daily to check blood sugar   clopidogrel (PLAVIX) 75 MG tablet Take 1 tablet (75 mg total) by mouth daily.   doxycycline (ADOXA) 100 MG tablet Take 1 tablet (100 mg total) by mouth 2 (two) times daily for 10 days.   Dulaglutide (TRULICITY) 1.5 HG/9.9ME SOPN Inject 1.5 mg into the skin once a week.   ELIQUIS 5 MG TABS tablet Take 5 mg by mouth 2 (two) times daily.   ezetimibe (ZETIA) 10 MG tablet Take 1 tablet (10 mg total) by mouth daily.   insulin glargine (LANTUS SOLOSTAR) 100 UNIT/ML Solostar Pen Inject 12 Units into the skin at bedtime. (Patient taking differently: Inject 12 Units into the skin at bedtime.)   isosorbide mononitrate (IMDUR) 60 MG 24 hr tablet Take 1 tablet (60 mg total) by mouth daily.   JARDIANCE 25 MG TABS tablet TAKE ONE TABLET BY MOUTH BEFORE BREAKFAST   loperamide (IMODIUM) 2 MG capsule Take 2 mg by mouth daily as needed for diarrhea or loose stools.   metoprolol tartrate (LOPRESSOR) 100 MG tablet Take 100 mg by mouth 2 (two) times daily.   nitroGLYCERIN (NITROSTAT) 0.4 MG SL tablet Place 1 tablet (0.4 mg total) under the tongue every 5 (five) minutes as needed for chest pain.   nystatin (NYSTATIN) powder Apply 1 application topically daily. (Patient taking differently: Apply 1 application  topically as needed.)   ondansetron (ZOFRAN-ODT) 8 MG disintegrating tablet TAKE ONE TABLET BY MOUTH EVERY 8 HOURS AS NEEDED FOR NAUSEA AND VOMITING   pantoprazole (PROTONIX) 20 MG tablet Take  1 tablet (20 mg total) by mouth 2 (two) times daily.   rosuvastatin (CRESTOR) 40 MG tablet TAKE ONE TABLET BY MOUTH ONCE DAILY   sacubitril-valsartan (ENTRESTO) 24-26 MG Take 1 tablet by mouth daily.   sertraline (ZOLOFT) 100 MG tablet Take 1 tablet (100 mg total) by mouth daily.   COMFORT EZ PEN NEEDLES 32G X 4 MM MISC Use to inject insulin daily   glucose blood test strip Use to check blood sugars daily as instructed   Lancets (ONETOUCH DELICA PLUS [id number])  MISC USE UP TO 4 TIMES DAILY AS DIRECTED   [DISCONTINUED] predniSONE (DELTASONE) 50 MG tablet Take 1 tablet (50 mg total) by mouth daily with breakfast for 7 days. (Patient not taking: Reported on 01/08/2022)   No facility-administered medications prior to visit.    Review of Systems  Neurological:  Positive for tremors and speech difficulty.       Objective    BP 108/67 (BP Location: Right Arm, Patient Position: Sitting, Cuff Size: Normal)   Pulse 80   Temp 98.3 F (36.8 C) (Oral)   Resp 16   Wt 170 lb 1.6 oz (77.2 kg)   BMI 31.11 kg/m    Physical Exam Cardiovascular:     Rate and Rhythm: Normal rate.  Pulmonary:     Effort: Pulmonary effort is normal.  Neurological:     Mental Status: She is alert and oriented to person, place, and time.     Comments: Significant tremors at rest and with intention.  Hand tremors w/ intention and head tremor at rest. Voice is shaky but pt is able to articulate.  Psychiatric:        Behavior: Behavior is cooperative.      No results found for any visits on 01/08/22.  Assessment & Plan     Problem List Items Addressed This Visit       Endocrine   Type 2 diabetes mellitus with diabetic polyneuropathy, with long-term current use of insulin ([place])    Referred to optho for dm exam as well as eval for blurred vision      Relevant Orders   Ambulatory referral to Ophthalmology     Other   Headache    Pt has seen neuro for headaches previously, classified as tension headaches I do agree w/ ED's dx of headache 2/2 sinus pain/dental pain Today initial headache is resolved      Tremors of nervous system - Primary    Unsure of patient's baseline, but significant tremors and voice shaking on exam today. Ref to neurology. D/t unsteady gait, encouraged pt use her walker!       Relevant Orders   Ambulatory referral to Neurology   Blurred vision, right eye    Ref to optho      Relevant Orders   Ambulatory referral to  Ophthalmology    Return in about 8 weeks (around 03/05/2022) for chronic conditions.      I, Mikey Kirschner, PA-C have reviewed all documentation for this visit. The documentation on  01/08/2022 for the exam, diagnosis, procedures, and orders are all accurate and complete.  Mikey Kirschner, PA-C [facility] [address] [place], [place], [address] Office: [phone] Fax: [place]

## 2022-01-08 ENCOUNTER — Ambulatory Visit (INDEPENDENT_AMBULATORY_CARE_PROVIDER_SITE_OTHER): Payer: 59 | Admitting: Physician Assistant

## 2022-01-08 ENCOUNTER — Encounter: Payer: Self-pay | Admitting: Physician Assistant

## 2022-01-08 VITALS — BP 108/67 | HR 80 | Temp 98.3°F | Resp 16 | Wt 170.1 lb

## 2022-01-08 DIAGNOSIS — G44229 Chronic tension-type headache, not intractable: Secondary | ICD-10-CM

## 2022-01-08 DIAGNOSIS — R251 Tremor, unspecified: Secondary | ICD-10-CM | POA: Diagnosis not present

## 2022-01-08 DIAGNOSIS — E1142 Type 2 diabetes mellitus with diabetic polyneuropathy: Secondary | ICD-10-CM | POA: Diagnosis not present

## 2022-01-08 DIAGNOSIS — H538 Other visual disturbances: Secondary | ICD-10-CM

## 2022-01-08 DIAGNOSIS — Z794 Long term (current) use of insulin: Secondary | ICD-10-CM

## 2022-01-08 NOTE — Assessment & Plan Note (Signed)
Referred to optho for dm exam as well as eval for blurred vision

## 2022-01-08 NOTE — Assessment & Plan Note (Signed)
Pt has seen neuro for headaches previously, classified as tension headaches I do agree w/ ED's dx of headache 2/2 sinus pain/dental pain Today initial headache is resolved

## 2022-01-08 NOTE — Assessment & Plan Note (Signed)
Unsure of patient's baseline, but significant tremors and voice shaking on exam today. Ref to neurology. D/t unsteady gait, encouraged pt use her walker!

## 2022-01-08 NOTE — Assessment & Plan Note (Signed)
Ref to optho

## 2022-01-13 LAB — HM DIABETES EYE EXAM

## 2022-01-19 ENCOUNTER — Telehealth: Payer: Self-pay

## 2022-01-19 DIAGNOSIS — E1169 Type 2 diabetes mellitus with other specified complication: Secondary | ICD-10-CM

## 2022-01-19 DIAGNOSIS — F4320 Adjustment disorder, unspecified: Secondary | ICD-10-CM

## 2022-01-19 MED ORDER — EZETIMIBE 10 MG PO TABS: 10.0000 mg | ORAL_TABLET | Freq: Every day | ORAL | 1 refills | Status: AC

## 2022-01-19 MED ORDER — SERTRALINE HCL 100 MG PO TABS: 100.0000 mg | ORAL_TABLET | Freq: Every day | ORAL | 1 refills | Status: DC

## 2022-01-19 NOTE — Addendum Note (Signed)
Addended by: Daron Offer A on: 01/19/2022 11:53 AM   Modules accepted: Orders

## 2022-01-19 NOTE — Telephone Encounter (Signed)
Refill of Ezetimibe sent into [facility].   Junius Argyle, PharmD, Para March, CPP  Clinical Pharmacist Practitioner  [facility] [phone]

## 2022-01-27 ENCOUNTER — Telehealth: Payer: Self-pay | Admitting: Family Medicine

## 2022-01-27 NOTE — Telephone Encounter (Signed)
[facility] faxed refill request for the following medications:  Dulaglutide (TRULICITY) 1.5 FU/8.3AF SOPN   Please advise.

## 2022-01-28 ENCOUNTER — Other Ambulatory Visit: Payer: Self-pay | Admitting: Physician Assistant

## 2022-01-28 DIAGNOSIS — Z794 Long term (current) use of insulin: Secondary | ICD-10-CM

## 2022-01-28 MED ORDER — TRULICITY 1.5 MG/0.5ML ~~LOC~~ SOAJ: 1.5000 mg | SUBCUTANEOUS | 1 refills | Status: DC

## 2022-01-28 NOTE — Telephone Encounter (Signed)
sent 

## 2022-02-01 ENCOUNTER — Other Ambulatory Visit: Payer: Self-pay | Admitting: Physician Assistant

## 2022-02-01 DIAGNOSIS — Z1231 Encounter for screening mammogram for malignant neoplasm of breast: Secondary | ICD-10-CM

## 2022-02-03 ENCOUNTER — Other Ambulatory Visit: Payer: Self-pay | Admitting: Neurology

## 2022-02-03 DIAGNOSIS — G250 Essential tremor: Secondary | ICD-10-CM

## 2022-02-03 DIAGNOSIS — Z8673 Personal history of transient ischemic attack (TIA), and cerebral infarction without residual deficits: Secondary | ICD-10-CM

## 2022-02-04 ENCOUNTER — Telehealth: Payer: Self-pay

## 2022-02-04 NOTE — Progress Notes (Signed)
Chronic Care Management Pharmacy Assistant   Name: Candace [name]  MRN: [id number] DOB: 1951-02-28   Reason for Encounter: Medication Review/Medication Coordination Call.   Recent office visits:  01/08/2022 Mikey Kirschner PA-C (PCP Office)No Medication Changes noted, Ambulatory referral to Ophthalmology, Ambulatory referral to Neurology, Return in about 8 weeks     Recent consult visits:  02/02/2022 Dr. Manuella Ghazi MD (Neurology) No Medication Changes noted 01/13/2022 Sharlotte Alamo DPM (Podiatry) No Medication Changes noted  Hospital visits:  Medication Reconciliation was completed by comparing discharge summary, patient's EMR and Pharmacy list, and upon discussion with patient.  Admitted to the hospital on 01/01/2022 due to Intractable headache . Discharge date was 01/01/2022. Discharged from [place]?Medications Started at [facility] Discharge:?? -started  oxycodone  5 MG  At bedtime PRN   -Started  doxycycline 100 MG  2 times daily -Started predniSONE 50 MG daily  Medication Changes at Hospital Discharge: -Changed None ID  Medications Discontinued at Hospital Discharge: -Stopped None ID  Medications that remain the same after Hospital Discharge:??  -All other medications will remain the same.    Medications: Outpatient Encounter Medications as of 02/04/2022  Medication Sig Note   acetaminophen (TYLENOL) 325 MG tablet Take 650 mg by mouth every 6 (six) hours as needed for moderate pain or headache.     albuterol (VENTOLIN HFA) 108 (90 Base) MCG/ACT inhaler Inhale 2 puffs into the lungs every 6 (six) hours as needed for wheezing or shortness of breath.    ALPRAZolam (XANAX) 0.25 MG tablet Take 1 tablet (0.25 mg total) by mouth every 8 (eight) hours as needed for anxiety.    Blood Glucose Monitoring Suppl (ONETOUCH VERIO) w/Device KIT Use daily to check blood sugar    clopidogrel (PLAVIX) 75 MG tablet Take 1 tablet (75 mg total) by mouth daily.    COMFORT  EZ PEN NEEDLES 32G X 4 MM MISC Use to inject insulin daily    Dulaglutide (TRULICITY) 1.5 VE/7.2CN SOPN Inject 1.5 mg into the skin once a week.    ELIQUIS 5 MG TABS tablet Take 5 mg by mouth 2 (two) times daily.    ezetimibe (ZETIA) 10 MG tablet Take 1 tablet (10 mg total) by mouth daily.    glucose blood test strip Use to check blood sugars daily as instructed    insulin glargine (LANTUS SOLOSTAR) 100 UNIT/ML Solostar Pen Inject 12 Units into the skin at bedtime. (Patient taking differently: Inject 12 Units into the skin at bedtime.) 10/09/2021: Reports dose was increased to 14 units.   isosorbide mononitrate (IMDUR) 60 MG 24 hr tablet Take 1 tablet (60 mg total) by mouth daily. 10/21/2021: Reports having 120 mg dose on hand. Confirmed cutting dose in half and taking 27m.   JARDIANCE 25 MG TABS tablet TAKE ONE TABLET BY MOUTH BEFORE BREAKFAST    Lancets (ONETOUCH DELICA PLUS [id number] MISC USE UP TO 4 TIMES DAILY AS DIRECTED    loperamide (IMODIUM) 2 MG capsule Take 2 mg by mouth daily as needed for diarrhea or loose stools.    metoprolol tartrate (LOPRESSOR) 100 MG tablet Take 100 mg by mouth 2 (two) times daily.    nitroGLYCERIN (NITROSTAT) 0.4 MG SL tablet Place 1 tablet (0.4 mg total) under the tongue every 5 (five) minutes as needed for chest pain.    nystatin (NYSTATIN) powder Apply 1 application topically daily. (Patient taking differently: Apply 1 application  topically as needed.)    ondansetron (ZOFRAN-ODT) 8 MG  disintegrating tablet TAKE ONE TABLET BY MOUTH EVERY 8 HOURS AS NEEDED FOR NAUSEA AND VOMITING    pantoprazole (PROTONIX) 20 MG tablet Take 1 tablet (20 mg total) by mouth 2 (two) times daily.    rosuvastatin (CRESTOR) 40 MG tablet TAKE ONE TABLET BY MOUTH ONCE DAILY    sacubitril-valsartan (ENTRESTO) 24-26 MG Take 1 tablet by mouth daily.    sertraline (ZOLOFT) 100 MG tablet Take 1 tablet (100 mg total) by mouth daily.    [DISCONTINUED] cetirizine (ZYRTEC) 5 MG tablet Take 2  tablets (10 mg total) by mouth daily.    No facility-administered encounter medications on file as of 02/04/2022.    Care Gaps: COVID 19 Vaccine (4- Booster) Tetanus Vaccine (Last Completed 07/28/2010) Shingrix Vaccine (2 of 2) (Last Completed 06/14/2020) Foot Exam (Last Completed 10/27/2019) Diabetic Kidney Evaluation Influenza Vaccine   Star Rating Drugs: 10/16/2021 Rosuvastatin 40 mg last filled for a 90- Day supply at [facility] 01/19/2022 Jardiance 25 mg last filled for a 90-Day Supply at [facility] 57/84/6962 Trulicity 1.5 mg last filled for a 28-Day supply at [facility] 24-26 mg last filled 10/05/2021 [facility] Day [facility]  Reviewed chart for medication changes ahead of medication coordination call.  BP Readings from Last 3 Encounters:  01/08/22 108/67  01/01/22 138/78  11/12/21 (!) 104/55    Lab Results  Component Value Date   HGBA1C 8.0 (A) 11/12/2021     Patient obtains medications through Adherence Packaging  90 Days   Last adherence delivery included: Trulicity 1.5 mg Inject 1.5 mg into the skin once a week Insulin Glargine 100 unit/ML Solostar Pen- Inject 14 units into the skin at bedtime  Patient declined medications last month.Patient reports she has abundance of supply since she was receiving her medication through Rehab.  Pantoprazole 20 mg one tablet two times a day- breakfast, Evening meals Losartan 25 mg one tablet daily- Bedtime Eliquis 5 mg 1 tablet two times daily breakfast, evening meals Jardiance 25 mg 1 tablet daily before breakfast Rosuvastatin 40 mg 1 tablet daily - Breakfast Sertraline 100 mg 1 tablet daily breakfast  Metoprolol tartrate 100 mg 1 tablet 2 times daily Breakfast and Evening Meals Isosorbide Mononitrate 120 mg 1 tablet daily - Breakfast Ondansetron 8 mg PRN Nitroglycerin 0.4 mg PRN Onetouch Ultra Test Strip  ezetimibe 10 mg 1 tablet daily breakfast  Clopidogrel 75 mg 1 tablet daily  breakfast  Pen needles  32Gx4 MM  Patient is due for next adherence delivery on: 02/17/2022. Called patient and reviewed medications and coordinated delivery.  This delivery to include: Pantoprazole 20 mg one tablet two times a day- breakfast, Evening meals Eliquis 5 mg 1 tablet two times daily breakfast, evening meals Jardiance 25 mg 1 tablet daily before breakfast Rosuvastatin 40 mg 1 tablet daily - Breakfast Sertraline 100 mg 1 tablet daily breakfast  Metoprolol tartrate 100 mg 1 tablet 2 times daily Breakfast and Evening Meals Isosorbide Mononitrate 60 mg 1 tablet daily - Breakfast ezetimibe 10 mg 1 tablet daily breakfast  Pen needles  32Gx4 MM Entresto 24-26 mg Take 1 tablet by mouth daily - Evening Meals Clopidogrel 75 mg 1 tablet daily breakfast   Patient declined the following medications: Trulicity 1.5 mg Inject 1.5 mg into the skin once a week - Adequate supply, last filled 02/01/2022 for 28 day supply Insulin Glargine 100 unit/ML Solostar Pen- Inject 14 units into the skin at bedtime- Adequate supply, last filled 02/01/2022 for 34 day supply Ondansetron 8 mg  PRN Nitroglycerin 0.4 mg PRN Onetouch Ultra Test Strip   Patient needs refills for None ID.  Confirmed delivery date of 02/17/2022 ([facility]), advised patient that pharmacy will contact them the morning of delivery.  [place] Pharmacist Assistant [phone]

## 2022-02-11 ENCOUNTER — Ambulatory Visit
Admission: RE | Admit: 2022-02-11 | Discharge: 2022-02-11 | Disposition: A | Discharge status: Home / Self Care | Payer: 59 | Source: Ambulatory Visit | Attending: Neurology | Admitting: Neurology

## 2022-02-11 DIAGNOSIS — Z8673 Personal history of transient ischemic attack (TIA), and cerebral infarction without residual deficits: Secondary | ICD-10-CM | POA: Insufficient documentation

## 2022-02-11 DIAGNOSIS — G250 Essential tremor: Secondary | ICD-10-CM | POA: Insufficient documentation

## 2022-02-12 ENCOUNTER — Telehealth: Payer: Self-pay

## 2022-02-12 ENCOUNTER — Other Ambulatory Visit: Payer: Self-pay | Admitting: Physician Assistant

## 2022-02-12 ENCOUNTER — Telehealth: Payer: Self-pay | Admitting: Family Medicine

## 2022-02-12 DIAGNOSIS — Z794 Long term (current) use of insulin: Secondary | ICD-10-CM

## 2022-02-12 DIAGNOSIS — I214 Non-ST elevation (NSTEMI) myocardial infarction: Secondary | ICD-10-CM

## 2022-02-12 MED ORDER — CLOPIDOGREL BISULFATE 75 MG PO TABS: 75.0000 mg | ORAL_TABLET | Freq: Every day | ORAL | 1 refills | Status: DC

## 2022-02-12 NOTE — Progress Notes (Signed)
I reach out to Dr. Nehemiah Massed office to request a refill for Clopidogrel 75 mg to go to [facility] on 02/12/2022. Per Cardiology they will send the message to the provider.  [place] Pharmacist Assistant [phone]

## 2022-02-12 NOTE — Telephone Encounter (Signed)
[facility] faxed refill request for the following medications:   clopidogrel (PLAVIX) 75 MG tablet    Please advise.

## 2022-03-01 ENCOUNTER — Telehealth: Payer: Self-pay

## 2022-03-01 DIAGNOSIS — E118 Type 2 diabetes mellitus with unspecified complications: Secondary | ICD-10-CM

## 2022-03-01 NOTE — Progress Notes (Unsigned)
Patient states she needs refill for One touch Test strips.[facility] states [facility] request a refill which it was denied  by [facility] because she no longer a patient as she follow Dr. Rosanna Randy to his new clinical. Patient reports she has not had a appointment schedule with him yet. Notified Clinical pharmacist.   Per Clinical pharmacist, [facility], and please un enroll patient since she is no longer at [facility] practice.  Acute form completed and sent to [facility] for processing for delivery for today 03/02/2022.Patient was un enroll form Chronic care management as requested.  [place] Pharmacist Assistant [phone]

## 2022-03-02 MED ORDER — GLUCOSE BLOOD VI STRP: ORAL_STRIP | 12 refills | Status: AC

## 2022-03-04 ENCOUNTER — Ambulatory Visit
Admission: RE | Admit: 2022-03-04 | Discharge: 2022-03-04 | Disposition: A | Discharge status: Home / Self Care | Payer: 59 | Source: Ambulatory Visit | Attending: Physician Assistant | Admitting: Physician Assistant

## 2022-03-04 DIAGNOSIS — Z1231 Encounter for screening mammogram for malignant neoplasm of breast: Secondary | ICD-10-CM | POA: Insufficient documentation

## 2022-03-09 ENCOUNTER — Telehealth: Payer: Self-pay

## 2022-03-09 NOTE — Telephone Encounter (Signed)
Copied from [place] [id number]. Topic: General - Other >> Mar 09, 2022 12:48 PM Oley Balm E wrote: She has started with [facility], they will be sending their information to the office   Vita Barley [phone]  Main line for [facility] [phone]  Pt is doing well no complaints

## 2022-03-09 NOTE — Telephone Encounter (Signed)
Acknowledged. Will follow with [facility] documentation as available.   Eulis Foster, MD  [facility]

## 2022-03-25 ENCOUNTER — Emergency Department: Payer: 59

## 2022-03-25 ENCOUNTER — Emergency Department
Admission: EM | Admit: 2022-03-25 | Discharge: 2022-03-25 | Disposition: A | Discharge status: Home / Self Care | Payer: 59 | Attending: Emergency Medicine | Admitting: Emergency Medicine

## 2022-03-25 ENCOUNTER — Other Ambulatory Visit: Payer: Self-pay

## 2022-03-25 ENCOUNTER — Encounter: Payer: Self-pay | Admitting: Emergency Medicine

## 2022-03-25 DIAGNOSIS — E119 Type 2 diabetes mellitus without complications: Secondary | ICD-10-CM | POA: Diagnosis not present

## 2022-03-25 DIAGNOSIS — I110 Hypertensive heart disease with heart failure: Secondary | ICD-10-CM | POA: Diagnosis not present

## 2022-03-25 DIAGNOSIS — R0789 Other chest pain: Secondary | ICD-10-CM | POA: Diagnosis not present

## 2022-03-25 DIAGNOSIS — Z20822 Contact with and (suspected) exposure to covid-19: Secondary | ICD-10-CM | POA: Diagnosis not present

## 2022-03-25 DIAGNOSIS — I2510 Atherosclerotic heart disease of native coronary artery without angina pectoris: Secondary | ICD-10-CM | POA: Diagnosis not present

## 2022-03-25 DIAGNOSIS — R079 Chest pain, unspecified: Secondary | ICD-10-CM | POA: Diagnosis present

## 2022-03-25 DIAGNOSIS — Z951 Presence of aortocoronary bypass graft: Secondary | ICD-10-CM | POA: Insufficient documentation

## 2022-03-25 DIAGNOSIS — Z7901 Long term (current) use of anticoagulants: Secondary | ICD-10-CM | POA: Diagnosis not present

## 2022-03-25 DIAGNOSIS — I509 Heart failure, unspecified: Secondary | ICD-10-CM | POA: Diagnosis not present

## 2022-03-25 DIAGNOSIS — B349 Viral infection, unspecified: Secondary | ICD-10-CM | POA: Diagnosis not present

## 2022-03-25 LAB — CBC
HCT: 41.7 % (ref 36.0–46.0)
Hemoglobin: 13.4 g/dL (ref 12.0–15.0)
MCH: 30.6 pg (ref 26.0–34.0)
MCHC: 32.1 g/dL (ref 30.0–36.0)
MCV: 95.2 fL (ref 80.0–100.0)
Platelets: 178 10*3/uL (ref 150–400)
RBC: 4.38 MIL/uL (ref 3.87–5.11)
RDW: 13.1 % (ref 11.5–15.5)
WBC: 6.1 10*3/uL (ref 4.0–10.5)
nRBC: 0 % (ref 0.0–0.2)

## 2022-03-25 LAB — BASIC METABOLIC PANEL
Anion gap: 6 (ref 5–15)
BUN: 13 mg/dL (ref 8–23)
CO2: 24 mmol/L (ref 22–32)
Calcium: 8.8 mg/dL — ABNORMAL LOW (ref 8.9–10.3)
Chloride: 110 mmol/L (ref 98–111)
Creatinine, Ser: 0.69 mg/dL (ref 0.44–1.00)
GFR, Estimated: >60 mL/min (ref >60)
Glucose, Bld: 118 mg/dL — ABNORMAL HIGH (ref 70–99)
Potassium: 4.1 mmol/L (ref 3.5–5.1)
Sodium: 140 mmol/L (ref 135–145)

## 2022-03-25 LAB — RESP PANEL BY RT-PCR (RSV, FLU A&B, COVID)  RVPGX2
Influenza A by PCR: NEGATIVE
Influenza B by PCR: NEGATIVE
Resp Syncytial Virus by PCR: NEGATIVE
SARS Coronavirus 2 by RT PCR: NEGATIVE

## 2022-03-25 LAB — TROPONIN I (HIGH SENSITIVITY): Troponin I (High Sensitivity): 6 ng/L (ref <18)

## 2022-03-25 NOTE — ED Triage Notes (Signed)
Patient arrives ambulatory by POV c/o left sided chest pain since Saturday. No relief from tylenol. States multiple sick contacts over the past week. Reports scratchy throat and diarrhea since last night.

## 2022-03-25 NOTE — ED Provider Notes (Signed)
[facility] Provider Note    Event Date/Time   First MD Initiated Contact with Patient 03/25/22 1553     (approximate)   History   Chief Complaint Chest Pain   HPI  Candace [name] is a 71 y.o. female with past medical history of hypertension, diabetes, CAD status post CABG, CHF, atrial fibrillation on Eliquis, and GERD who presents to the ED complaining of chest pain.  Patient reports that she has been dealing with constant discomfort in her chest over the past 3 days, which she describes as a tightness.  Pain does not seem to be exacerbated or alleviated by anything in particular.  She does report a dry cough and chills, but is not aware of any fevers and has not felt short of breath.  She does report generalized malaise with some diarrhea, has not had any nausea or vomiting.  She was exposed to multiple family members sick with similar symptoms over the holidays.     Physical Exam   Triage Vital Signs: ED Triage Vitals [03/25/22 1403]  Enc Vitals Group     BP 105/75     Pulse Rate 87     Resp 18     Temp 98 F (36.7 C)     Temp Source Oral     SpO2 94 %     Weight 170 lb (77.1 kg)     Height '5\' 2"'$  (1.575 m)     Head Circumference      Peak Flow      Pain Score 7     Pain Loc      Pain Edu?      Excl. in [place]?     Most recent vital signs: Vitals:   03/25/22 1403  BP: 105/75  Pulse: 87  Resp: 18  Temp: 98 F (36.7 C)  SpO2: 94%    Constitutional: Alert and oriented. Eyes: Conjunctivae are normal. Head: Atraumatic. Nose: No congestion/rhinnorhea. Mouth/Throat: Mucous membranes are moist.  Cardiovascular: Normal rate, regular rhythm. Grossly normal heart sounds.  2+ radial pulses bilaterally. Respiratory: Normal respiratory effort.  No retractions. Lungs CTAB. Gastrointestinal: Soft and nontender. No distention. Musculoskeletal: No lower extremity tenderness nor edema.  Neurologic:  Normal speech and language. No gross focal  neurologic deficits are appreciated.    ED Results / Procedures / Treatments   Labs (all labs ordered are listed, but only abnormal results are displayed) Labs Reviewed  BASIC METABOLIC PANEL - Abnormal; Notable for the following components:      Result Value   Glucose, Bld 118 (*)    Calcium 8.8 (*)    All other components within normal limits  RESP PANEL BY RT-PCR (RSV, FLU A&B, COVID)  RVPGX2  CBC  TROPONIN I (HIGH SENSITIVITY)     EKG  ED ECG REPORT I, Blake Divine, the attending physician, personally viewed and interpreted this ECG.   Date: 03/25/2022  EKG Time: 13:56  Rate: 85  Rhythm: normal sinus rhythm  Axis: Normal  Intervals:left bundle branch block  ST&T Change: None  RADIOLOGY Chest x-ray reviewed and interpreted by me with no infiltrate, edema, or effusion.  PROCEDURES:  Critical Care performed: No  Procedures   MEDICATIONS ORDERED IN ED: Medications - No data to display   IMPRESSION / MDM / [place] / ED COURSE  I reviewed the triage vital signs and the nursing notes.  71 y.o. female with past medical history of hypertension, diabetes, CAD status post CABG, CHF, atrial fibrillation on Eliquis, and GERD who presents to the ED complaining of 3 days of constant tightness in her chest with a dry cough, malaise, chills, and diarrhea.  Patient's presentation is most consistent with acute presentation with potential threat to life or bodily function.  Differential diagnosis includes, but is not limited to, ACS, PE, pneumonia, pneumothorax, GERD, musculoskeletal pain, viral syndrome, pneumonia, COVID-19, influenza.  Patient nontoxic-appearing and in no acute distress, vital signs are unremarkable.  Lungs are clear to auscultation bilaterally and patient breathing comfortably on room air with oxygen saturations of 94%.  EKG shows left bundle branch block similar to previous, symptoms atypical for ACS and troponin  within normal limits.  Given constant symptoms for 3 days, doubt ACS at this time.  Also doubt PE given reassuring vital signs, symptoms sound more consistent with viral syndrome.  Testing for COVID-19 and influenza is negative, chest x-ray is unremarkable.  Remainder of labs are reassuring with no significant anemia, leukocytosis, electrolyte abnormality, or AKI.  Patient is appropriate for discharge home with PCP follow-up, was counseled to return to the ED for new or worsening symptoms, patient and family agree with plan.      FINAL CLINICAL IMPRESSION(S) / ED DIAGNOSES   Final diagnoses:  Atypical chest pain  Viral syndrome     Rx / DC Orders   ED Discharge Orders     None        Note:  This document was prepared using Dragon voice recognition software and may include unintentional dictation errors.   Blake Divine, MD 03/25/22 1620

## 2022-03-25 NOTE — ED Provider Triage Note (Signed)
Emergency Medicine Provider Triage Evaluation Note  Candace [name] , a 71 y.o. female  was evaluated in triage.  Pt complains of left sided chest pain x 6 days. Scratchy throat and diarrhea since last night.    Review of Systems  Positive: S/t and diarrhea Negative: No fever  Physical Exam  BP 105/75   Pulse 87   Temp 98 F (36.7 C) (Oral)   Resp 18   Ht '5\' 2"'$  (1.575 m)   Wt 77.1 kg   SpO2 94%   BMI 31.09 kg/m  Gen:   Awake, no distress   Resp:  Normal effort  MSK:   Moves extremities without difficulty  Other:    Medical Decision Making  Medically screening exam initiated at 2:14 PM.  Appropriate orders placed.  Candace [name] was informed that the remainder of the evaluation will be completed by another provider, this initial triage assessment does not replace that evaluation, and the importance of remaining in the ED until their evaluation is complete.     Candace Hai, PA-C 03/25/22 1417

## 2022-03-26 ENCOUNTER — Telehealth: Payer: Self-pay

## 2022-03-26 NOTE — Telephone Encounter (Signed)
  Care Management   Visit Note  03/26/2022 Name: Candace [name] MRN: [id number] DOB: April 03, 1950  Subjective: Candace [name] is a 71 y.o. year old female who is a primary care patient of [name]., MD. The Care Management team was consulted for assistance.       Engaged with patient by telephone. Candace [name] was evaluated in the [facility]/Emergency Department on 03/25/22   Assessment:  Outreach with Candace [name]. She was evaluated in the Emergency Room on 03/25/22 d/t chest pain. Reports symptoms started while at home yesterday. She was evaluated in the home by [facility] nurse and advised to seek further care in the Emergency Room. Reviewed symptoms. Reports feeling well today. Completing errands and tasks in the home without problems. Denies chest pain today. Denies palpitations, headaches, dizziness, or visual changes. Advised of recommendation for clinic follow up. Declined visit but agreed to contact clinic with concerns if needed. Very good family support. Reports family is readily available to assist if needed. Thorough discussion regarding worsening s/sx and indications for seeking immediate medical attention.    PLAN:   Will follow up within the next two weeks.     Horris Latino RN Care Manager/Chronic Care Management [phone]

## 2022-04-06 ENCOUNTER — Telehealth: Payer: Self-pay

## 2022-04-06 NOTE — Telephone Encounter (Signed)
  Care Management   Visit Note  04/06/2022 Name: Candace [name] MRN: [id number] DOB: Oct 02, 1950  Subjective: Candace [name] is a 72 y.o. year old female.  Previous patient of [name]., MD. The Care Management team was consulted for assistance.       Engaged with patient by telephone.    Interventions:  Outreach with Candace [name]. Doing well today.  Denies episodes of chest pain since her ER visit in December, however, reports she has not been able to complete additional follow up with the Cardiology team. Reports being informed that her assigned Cardiologist/Dr. Nehemiah Massed has relocated. Prefers not to be reassigned until she is evaluated by her PCP. Discussed pending transition. She has opted to continue care with Dr. Rosanna Randy once he is established at [facility]. She is on a wait list for a clinic visit once his schedule is available. Reports having all needed medications. Will call or contact the clinic with urgent concerns if needed.   Plan:   Will follow up next month to confirm transition to [facility]   Horris Latino RN Care Manager/Chronic Care Management [phone]

## 2022-05-03 ENCOUNTER — Other Ambulatory Visit: Payer: Self-pay | Admitting: Physician Assistant

## 2022-05-03 DIAGNOSIS — F4320 Adjustment disorder, unspecified: Secondary | ICD-10-CM

## 2022-05-03 DIAGNOSIS — E1169 Type 2 diabetes mellitus with other specified complication: Secondary | ICD-10-CM

## 2022-05-12 ENCOUNTER — Other Ambulatory Visit: Payer: Self-pay | Admitting: Cardiovascular Disease

## 2022-05-12 DIAGNOSIS — E1169 Type 2 diabetes mellitus with other specified complication: Secondary | ICD-10-CM

## 2022-05-12 DIAGNOSIS — F4320 Adjustment disorder, unspecified: Secondary | ICD-10-CM

## 2022-05-17 ENCOUNTER — Telehealth: Payer: Self-pay | Admitting: Family Medicine

## 2022-05-17 ENCOUNTER — Other Ambulatory Visit: Payer: Self-pay

## 2022-05-17 ENCOUNTER — Telehealth: Payer: Self-pay

## 2022-05-17 DIAGNOSIS — F4320 Adjustment disorder, unspecified: Secondary | ICD-10-CM

## 2022-05-17 DIAGNOSIS — E1169 Type 2 diabetes mellitus with other specified complication: Secondary | ICD-10-CM

## 2022-05-17 NOTE — Telephone Encounter (Signed)
Patient has established with Dr.Gilbert in [place] Rx declined Provider no longer at this office

## 2022-05-17 NOTE — Telephone Encounter (Signed)
[facility]ed refill request for the following medications:  sertraline (ZOLOFT) 100 MG tablet    ezetimibe (ZETIA) 10 MG tablet     rosuvastatin (CRESTOR) 40 MG tablet     Please advise

## 2022-05-17 NOTE — Telephone Encounter (Signed)
Care Management   Visit Note  05/17/2022 Name: CHALLIS WIDEMAN MRN: [id number] DOB: 08/20/1950  Subjective: AMELYAH BARGERSTOCK is a 72 y.o. year old female who is a primary care patient of Bosie Clos, MD.    Patient confirmed that she is transitioning Primary Care services to [facility].  She will remain enrolled with Dr. Sullivan Lone who is currently practicing at [facility].  No further interventions required.   [facility]/Care Management [phone]

## 2022-05-17 NOTE — Addendum Note (Signed)
Addended by: Barnie Mort on: 05/17/2022 10:24 AM   Modules accepted: Orders

## 2022-06-01 ENCOUNTER — Telehealth: Payer: Self-pay | Admitting: Family Medicine

## 2022-06-01 NOTE — Telephone Encounter (Signed)
Contacted Candace [name] to schedule their annual wellness visit. Patient declined to schedule AWV at this time.Transferred care to Dr.Gilbert's new office.  [place] Direct Dial: [phone]

## 2022-06-30 ENCOUNTER — Encounter: Payer: Self-pay | Admitting: Internal Medicine

## 2022-06-30 ENCOUNTER — Emergency Department: Payer: 59

## 2022-06-30 ENCOUNTER — Other Ambulatory Visit: Payer: Self-pay

## 2022-06-30 ENCOUNTER — Inpatient Hospital Stay
Admission: EM | Admit: 2022-06-30 | Discharge: 2022-07-02 | DRG: 303 | Disposition: A | Discharge status: Home / Self Care | Payer: 59 | Attending: Osteopathic Medicine | Admitting: Osteopathic Medicine

## 2022-06-30 ENCOUNTER — Observation Stay
Admit: 2022-06-30 | Discharge: 2022-06-30 | Disposition: A | Discharge status: Home / Self Care | Payer: 59 | Attending: Internal Medicine | Admitting: Internal Medicine

## 2022-06-30 DIAGNOSIS — Z801 Family history of malignant neoplasm of trachea, bronchus and lung: Secondary | ICD-10-CM

## 2022-06-30 DIAGNOSIS — J449 Chronic obstructive pulmonary disease, unspecified: Secondary | ICD-10-CM | POA: Diagnosis present

## 2022-06-30 DIAGNOSIS — F419 Anxiety disorder, unspecified: Secondary | ICD-10-CM | POA: Diagnosis present

## 2022-06-30 DIAGNOSIS — M1990 Unspecified osteoarthritis, unspecified site: Secondary | ICD-10-CM | POA: Diagnosis present

## 2022-06-30 DIAGNOSIS — I110 Hypertensive heart disease with heart failure: Secondary | ICD-10-CM | POA: Diagnosis present

## 2022-06-30 DIAGNOSIS — I10 Essential (primary) hypertension: Secondary | ICD-10-CM | POA: Diagnosis present

## 2022-06-30 DIAGNOSIS — I25110 Atherosclerotic heart disease of native coronary artery with unstable angina pectoris: Secondary | ICD-10-CM | POA: Diagnosis not present

## 2022-06-30 DIAGNOSIS — Z825 Family history of asthma and other chronic lower respiratory diseases: Secondary | ICD-10-CM

## 2022-06-30 DIAGNOSIS — E1159 Type 2 diabetes mellitus with other circulatory complications: Secondary | ICD-10-CM

## 2022-06-30 DIAGNOSIS — R1319 Other dysphagia: Secondary | ICD-10-CM | POA: Diagnosis present

## 2022-06-30 DIAGNOSIS — R079 Chest pain, unspecified: Secondary | ICD-10-CM | POA: Diagnosis present

## 2022-06-30 DIAGNOSIS — Z90710 Acquired absence of both cervix and uterus: Secondary | ICD-10-CM

## 2022-06-30 DIAGNOSIS — K760 Fatty (change of) liver, not elsewhere classified: Secondary | ICD-10-CM | POA: Diagnosis present

## 2022-06-30 DIAGNOSIS — Z66 Do not resuscitate: Secondary | ICD-10-CM | POA: Insufficient documentation

## 2022-06-30 DIAGNOSIS — Z7901 Long term (current) use of anticoagulants: Secondary | ICD-10-CM

## 2022-06-30 DIAGNOSIS — I200 Unstable angina: Principal | ICD-10-CM

## 2022-06-30 DIAGNOSIS — E782 Mixed hyperlipidemia: Secondary | ICD-10-CM | POA: Diagnosis present

## 2022-06-30 DIAGNOSIS — F418 Other specified anxiety disorders: Secondary | ICD-10-CM | POA: Diagnosis present

## 2022-06-30 DIAGNOSIS — R1314 Dysphagia, pharyngoesophageal phase: Secondary | ICD-10-CM | POA: Diagnosis present

## 2022-06-30 DIAGNOSIS — I69320 Aphasia following cerebral infarction: Secondary | ICD-10-CM

## 2022-06-30 DIAGNOSIS — E785 Hyperlipidemia, unspecified: Secondary | ICD-10-CM | POA: Diagnosis present

## 2022-06-30 DIAGNOSIS — Z79899 Other long term (current) drug therapy: Secondary | ICD-10-CM

## 2022-06-30 DIAGNOSIS — Z9079 Acquired absence of other genital organ(s): Secondary | ICD-10-CM

## 2022-06-30 DIAGNOSIS — Z951 Presence of aortocoronary bypass graft: Secondary | ICD-10-CM

## 2022-06-30 DIAGNOSIS — E1142 Type 2 diabetes mellitus with diabetic polyneuropathy: Secondary | ICD-10-CM | POA: Diagnosis present

## 2022-06-30 DIAGNOSIS — I959 Hypotension, unspecified: Secondary | ICD-10-CM | POA: Diagnosis not present

## 2022-06-30 DIAGNOSIS — K219 Gastro-esophageal reflux disease without esophagitis: Secondary | ICD-10-CM | POA: Diagnosis present

## 2022-06-30 DIAGNOSIS — Z7984 Long term (current) use of oral hypoglycemic drugs: Secondary | ICD-10-CM

## 2022-06-30 DIAGNOSIS — I252 Old myocardial infarction: Secondary | ICD-10-CM

## 2022-06-30 DIAGNOSIS — E669 Obesity, unspecified: Secondary | ICD-10-CM | POA: Diagnosis present

## 2022-06-30 DIAGNOSIS — Z794 Long term (current) use of insulin: Secondary | ICD-10-CM

## 2022-06-30 DIAGNOSIS — Z885 Allergy status to narcotic agent status: Secondary | ICD-10-CM

## 2022-06-30 DIAGNOSIS — Z800 Family history of malignant neoplasm of digestive organs: Secondary | ICD-10-CM

## 2022-06-30 DIAGNOSIS — K3189 Other diseases of stomach and duodenum: Secondary | ICD-10-CM | POA: Diagnosis present

## 2022-06-30 DIAGNOSIS — I2510 Atherosclerotic heart disease of native coronary artery without angina pectoris: Secondary | ICD-10-CM | POA: Diagnosis present

## 2022-06-30 DIAGNOSIS — R102 Pelvic and perineal pain: Secondary | ICD-10-CM | POA: Diagnosis present

## 2022-06-30 DIAGNOSIS — Z955 Presence of coronary angioplasty implant and graft: Secondary | ICD-10-CM

## 2022-06-30 DIAGNOSIS — Z8249 Family history of ischemic heart disease and other diseases of the circulatory system: Secondary | ICD-10-CM

## 2022-06-30 DIAGNOSIS — I447 Left bundle-branch block, unspecified: Secondary | ICD-10-CM | POA: Diagnosis present

## 2022-06-30 DIAGNOSIS — Z880 Allergy status to penicillin: Secondary | ICD-10-CM

## 2022-06-30 DIAGNOSIS — Z7982 Long term (current) use of aspirin: Secondary | ICD-10-CM

## 2022-06-30 DIAGNOSIS — I5032 Chronic diastolic (congestive) heart failure: Secondary | ICD-10-CM | POA: Diagnosis present

## 2022-06-30 DIAGNOSIS — F32A Depression, unspecified: Secondary | ICD-10-CM | POA: Diagnosis present

## 2022-06-30 DIAGNOSIS — Z90722 Acquired absence of ovaries, bilateral: Secondary | ICD-10-CM

## 2022-06-30 DIAGNOSIS — R1012 Left upper quadrant pain: Secondary | ICD-10-CM | POA: Diagnosis present

## 2022-06-30 DIAGNOSIS — I480 Paroxysmal atrial fibrillation: Secondary | ICD-10-CM | POA: Diagnosis present

## 2022-06-30 DIAGNOSIS — R1011 Right upper quadrant pain: Secondary | ICD-10-CM | POA: Diagnosis present

## 2022-06-30 DIAGNOSIS — Z803 Family history of malignant neoplasm of breast: Secondary | ICD-10-CM

## 2022-06-30 DIAGNOSIS — Z7985 Long-term (current) use of injectable non-insulin antidiabetic drugs: Secondary | ICD-10-CM

## 2022-06-30 DIAGNOSIS — Z888 Allergy status to other drugs, medicaments and biological substances status: Secondary | ICD-10-CM

## 2022-06-30 DIAGNOSIS — Z87891 Personal history of nicotine dependence: Secondary | ICD-10-CM

## 2022-06-30 LAB — BASIC METABOLIC PANEL
Anion gap: 8 (ref 5–15)
BUN: 13 mg/dL (ref 8–23)
CO2: 25 mmol/L (ref 22–32)
Calcium: 9.4 mg/dL (ref 8.9–10.3)
Chloride: 107 mmol/L (ref 98–111)
Creatinine, Ser: 0.76 mg/dL (ref 0.44–1.00)
GFR, Estimated: >60 mL/min (ref >60)
Glucose, Bld: 133 mg/dL — ABNORMAL HIGH (ref 70–99)
Potassium: 4.2 mmol/L (ref 3.5–5.1)
Sodium: 140 mmol/L (ref 135–145)

## 2022-06-30 LAB — CBC
HCT: 44.6 % (ref 36.0–46.0)
Hemoglobin: 14.5 g/dL (ref 12.0–15.0)
MCH: 31.8 pg (ref 26.0–34.0)
MCHC: 32.5 g/dL (ref 30.0–36.0)
MCV: 97.8 fL (ref 80.0–100.0)
Platelets: 181 10*3/uL (ref 150–400)
RBC: 4.56 MIL/uL (ref 3.87–5.11)
RDW: 12.2 % (ref 11.5–15.5)
WBC: 6.7 10*3/uL (ref 4.0–10.5)
nRBC: 0 % (ref 0.0–0.2)

## 2022-06-30 LAB — TROPONIN I (HIGH SENSITIVITY)
Troponin I (High Sensitivity): 10 ng/L (ref <18)
Troponin I (High Sensitivity): 8 ng/L (ref <18)

## 2022-06-30 LAB — URINALYSIS, COMPLETE (UACMP) WITH MICROSCOPIC
Bilirubin Urine: NEGATIVE
Glucose, UA: >=500 mg/dL — AB
Ketones, ur: NEGATIVE mg/dL
Nitrite: NEGATIVE
Protein, ur: NEGATIVE mg/dL
Specific Gravity, Urine: 1.029 (ref 1.005–1.030)
pH: 5 (ref 5.0–8.0)

## 2022-06-30 LAB — HEPARIN LEVEL (UNFRACTIONATED): Heparin Unfractionated: 0.35 IU/mL (ref 0.30–0.70)

## 2022-06-30 LAB — APTT: aPTT: 32 seconds (ref 24–36)

## 2022-06-30 LAB — GLUCOSE, CAPILLARY
Glucose-Capillary: 99 mg/dL (ref 70–99)
Glucose-Capillary: 125 mg/dL — ABNORMAL HIGH (ref 70–99)

## 2022-06-30 MED ORDER — NITROGLYCERIN 2 % TD OINT
1.0000 [in_us] | TOPICAL_OINTMENT | Freq: Four times a day (QID) | TRANSDERMAL | Status: DC | PRN
  Administered 2022-06-30 – 2022-07-02 (×2): 1 [in_us] via TOPICAL
  Filled 2022-06-30 (×2): qty 1

## 2022-06-30 MED ORDER — MORPHINE SULFATE (PF) 2 MG/ML IV SOLN
2.0000 mg | INTRAVENOUS | Status: DC | PRN
  Administered 2022-06-30: 2 mg via INTRAVENOUS
  Filled 2022-06-30: qty 1

## 2022-06-30 MED ORDER — SACUBITRIL-VALSARTAN 24-26 MG PO TABS
1.0000 | ORAL_TABLET | Freq: Every day | ORAL | Status: DC
  Administered 2022-07-01: 1 via ORAL
  Filled 2022-06-30: qty 1

## 2022-06-30 MED ORDER — ALPRAZOLAM 0.25 MG PO TABS
0.2500 mg | ORAL_TABLET | Freq: Three times a day (TID) | ORAL | Status: DC | PRN
  Administered 2022-06-30: 0.25 mg via ORAL
  Filled 2022-06-30: qty 1

## 2022-06-30 MED ORDER — INSULIN GLARGINE-YFGN 100 UNIT/ML ~~LOC~~ SOLN
6.0000 [IU] | Freq: Every day | SUBCUTANEOUS | Status: AC
  Administered 2022-06-30: 6 [IU] via SUBCUTANEOUS
  Filled 2022-06-30: qty 0.06

## 2022-06-30 MED ORDER — ROSUVASTATIN CALCIUM 10 MG PO TABS
40.0000 mg | ORAL_TABLET | Freq: Every day | ORAL | Status: DC
  Administered 2022-06-30 – 2022-07-01 (×2): 40 mg via ORAL
  Filled 2022-06-30 (×2): qty 4

## 2022-06-30 MED ORDER — METOPROLOL TARTRATE 50 MG PO TABS
100.0000 mg | ORAL_TABLET | Freq: Two times a day (BID) | ORAL | Status: DC
  Filled 2022-06-30: qty 2

## 2022-06-30 MED ORDER — INSULIN ASPART 100 UNIT/ML IJ SOLN
0.0000 [IU] | Freq: Three times a day (TID) | INTRAMUSCULAR | Status: DC
  Administered 2022-07-01 – 2022-07-02 (×3): 2 [IU] via SUBCUTANEOUS
  Filled 2022-06-30 (×5): qty 1

## 2022-06-30 MED ORDER — MORPHINE SULFATE (PF) 2 MG/ML IV SOLN: 2.0000 mg | INTRAVENOUS | Status: DC | PRN

## 2022-06-30 MED ORDER — NITROGLYCERIN 0.4 MG SL SUBL: 0.4000 mg | SUBLINGUAL_TABLET | SUBLINGUAL | Status: DC | PRN

## 2022-06-30 MED ORDER — ONDANSETRON HCL 4 MG/2ML IJ SOLN
4.0000 mg | Freq: Four times a day (QID) | INTRAMUSCULAR | Status: DC | PRN
  Administered 2022-06-30 – 2022-07-02 (×3): 4 mg via INTRAVENOUS
  Filled 2022-06-30 (×4): qty 2

## 2022-06-30 MED ORDER — MORPHINE SULFATE (PF) 4 MG/ML IV SOLN
4.0000 mg | INTRAVENOUS | Status: DC | PRN
  Administered 2022-06-30 – 2022-07-01 (×2): 4 mg via INTRAVENOUS
  Filled 2022-06-30 (×3): qty 1

## 2022-06-30 MED ORDER — NYSTATIN 100000 UNIT/GM EX POWD: 1.0000 | CUTANEOUS | Status: DC | PRN

## 2022-06-30 MED ORDER — PERFLUTREN LIPID MICROSPHERE
1.0000 mL | INTRAVENOUS | Status: AC | PRN
  Administered 2022-06-30: 2 mL via INTRAVENOUS

## 2022-06-30 MED ORDER — APIXABAN 5 MG PO TABS: 5.0000 mg | ORAL_TABLET | Freq: Two times a day (BID) | ORAL | Status: DC

## 2022-06-30 MED ORDER — HYDROMORPHONE HCL 1 MG/ML IJ SOLN
1.0000 mg | INTRAMUSCULAR | Status: AC | PRN
  Administered 2022-06-30 – 2022-07-01 (×3): 1 mg via INTRAVENOUS
  Filled 2022-06-30 (×3): qty 1

## 2022-06-30 MED ORDER — ONDANSETRON HCL 4 MG/2ML IJ SOLN: 4.0000 mg | Freq: Four times a day (QID) | INTRAMUSCULAR | Status: DC | PRN

## 2022-06-30 MED ORDER — PANTOPRAZOLE SODIUM 20 MG PO TBEC
20.0000 mg | DELAYED_RELEASE_TABLET | Freq: Two times a day (BID) | ORAL | Status: DC
  Administered 2022-06-30 – 2022-07-02 (×4): 20 mg via ORAL
  Filled 2022-06-30 (×5): qty 1

## 2022-06-30 MED ORDER — INSULIN GLARGINE-YFGN 100 UNIT/ML ~~LOC~~ SOLN
12.0000 [IU] | Freq: Every day | SUBCUTANEOUS | Status: DC
  Administered 2022-07-01: 12 [IU] via SUBCUTANEOUS
  Filled 2022-06-30 (×2): qty 0.12

## 2022-06-30 MED ORDER — SERTRALINE HCL 50 MG PO TABS
100.0000 mg | ORAL_TABLET | Freq: Every day | ORAL | Status: DC
  Administered 2022-07-01 – 2022-07-02 (×2): 100 mg via ORAL
  Filled 2022-06-30 (×2): qty 2

## 2022-06-30 MED ORDER — ACETAMINOPHEN 325 MG PO TABS: 650.0000 mg | ORAL_TABLET | ORAL | Status: DC | PRN

## 2022-06-30 MED ORDER — HEPARIN SODIUM (PORCINE) 5000 UNIT/ML IJ SOLN: 5000.0000 [IU] | Freq: Three times a day (TID) | INTRAMUSCULAR | Status: DC

## 2022-06-30 MED ORDER — SODIUM CHLORIDE 0.9 % IV SOLN
12.5000 mg | Freq: Four times a day (QID) | INTRAVENOUS | Status: DC | PRN
  Administered 2022-07-02: 12.5 mg via INTRAVENOUS
  Filled 2022-06-30: qty 12.5

## 2022-06-30 MED ORDER — EZETIMIBE 10 MG PO TABS
10.0000 mg | ORAL_TABLET | Freq: Every day | ORAL | Status: DC
  Administered 2022-07-01 – 2022-07-02 (×2): 10 mg via ORAL
  Filled 2022-06-30 (×2): qty 1

## 2022-06-30 MED ORDER — ALBUTEROL SULFATE (2.5 MG/3ML) 0.083% IN NEBU: 3.0000 mL | INHALATION_SOLUTION | Freq: Four times a day (QID) | RESPIRATORY_TRACT | Status: DC | PRN

## 2022-06-30 MED ORDER — SODIUM CHLORIDE 0.9 % IV BOLUS
1000.0000 mL | Freq: Once | INTRAVENOUS | Status: AC
  Administered 2022-06-30: 1000 mL via INTRAVENOUS

## 2022-06-30 MED ORDER — INSULIN ASPART 100 UNIT/ML IJ SOLN: 0.0000 [IU] | Freq: Every day | INTRAMUSCULAR | Status: DC

## 2022-06-30 MED ORDER — HEPARIN (PORCINE) 25000 UT/250ML-% IV SOLN
800.0000 [IU]/h | INTRAVENOUS | Status: DC
  Administered 2022-06-30: 1000 [IU]/h via INTRAVENOUS
  Administered 2022-07-01: 850 [IU]/h via INTRAVENOUS
  Filled 2022-06-30 (×2): qty 250

## 2022-06-30 MED ORDER — ISOSORBIDE MONONITRATE ER 60 MG PO TB24
90.0000 mg | ORAL_TABLET | Freq: Every day | ORAL | Status: DC
  Administered 2022-06-30: 90 mg via ORAL
  Filled 2022-06-30 (×2): qty 1

## 2022-06-30 MED ORDER — ONDANSETRON HCL 4 MG/2ML IJ SOLN
4.0000 mg | Freq: Once | INTRAMUSCULAR | Status: AC
  Administered 2022-06-30: 4 mg via INTRAVENOUS
  Filled 2022-06-30: qty 2

## 2022-06-30 MED ORDER — ASPIRIN 81 MG PO TBEC
81.0000 mg | DELAYED_RELEASE_TABLET | Freq: Every day | ORAL | Status: DC
  Administered 2022-07-01 – 2022-07-02 (×2): 81 mg via ORAL
  Filled 2022-06-30 (×2): qty 1

## 2022-06-30 MED ORDER — INSULIN GLARGINE-YFGN 100 UNIT/ML ~~LOC~~ SOLN
12.0000 [IU] | Freq: Every day | SUBCUTANEOUS | Status: DC
  Filled 2022-06-30: qty 0.12

## 2022-06-30 NOTE — Assessment & Plan Note (Signed)
Home Eliquis 5 mg p.o. twice daily, metoprolol tartrate 100 mg p.o. twice daily were resumed

## 2022-06-30 NOTE — Assessment & Plan Note (Signed)
Today at bedside, patient reports that she would like 1 attempt at chest compressions I discussed that that would mean we would put her at full code, patient is in agreement with this for today.

## 2022-06-30 NOTE — Assessment & Plan Note (Signed)
Home PPI twice daily resumed

## 2022-06-30 NOTE — Assessment & Plan Note (Signed)
Patient is currently n.p.o. and decreased p.o. intake on day of admission therefore long-acting insulin has been decreased to 6 units nightly one-time dose Long-acting insulin 12 units subcutaneous nightly resumed for 07/01/2022 Insulin SSI with agents coverage ordered Go inpatient blood glucose levels 140-170

## 2022-06-30 NOTE — ED Notes (Signed)
Pt was at the grocery store this morning getting what they needed and broke out into a cold sweat. Pt states she had pain in her chest on the left side, and it "felt like she was being stabbed by a pin. The more I walked around it felt like I was being stabbed by a knife." Pt states that she was not able to think clearly, pt not sure if it was from the pain or something else. Pt states she had nausea and light-headedness.  Pt has a Hx of a stroke in 2016.

## 2022-06-30 NOTE — Assessment & Plan Note (Signed)
-   Resumed home sertraline 100 mg daily 

## 2022-06-30 NOTE — Consult Note (Addendum)
[place] NOTE       Patient ID: Candace [name] MRN: [id number] DOB/AGE: 1950-08-07 72 y.o.  Admit date: 06/30/2022 Referring Physician Dr. Rupert Stacks Primary Physician Dr. Miguel Aschoff Primary Cardiologist Dr. Clayborn Bigness Reason for Consultation unstable angina  HPI: Candace [name] is a 89yoF with a PMH of severe 3vCAD s/p CABG x 4 (atretic LIMA-LAD, occluded SVG to OM, patent SVG to RCA, with -70-80% mid LAD, 60-70% diffuse LCx disease (small vessel) and 90% distal RCA by [facility] 07/11/2020), HFpEF (EF 55-60%, G1 DD 07/16/2021), paroxysmal atrial fibrillation (Eliquis), hx TIA, DM2, essential tremor who presented to [facility] ED 06/30/2022 with chest pain that started while she was walking in the grocery store similar to her prior anginal pain.  Cardiology is consulted for further assistance.  The patient has a longstanding history of coronary disease with anatomy outlined as above, she had been doing well from a cardiac standpoint and was seen by her regular cardiologist in clinic on 3/25 where she was feeling good and was angina free, was taken off of her Plavix and isosorbide and was maintained on aspirin 81 mg and Eliquis for stroke prevention.  The patient tells me that she was taken off of isosorbide "because she was doing so well without chest pain for a while."  She states today she drove herself to the grocery store and as she started walking into the store she started experiencing centralized sharp chest discomfort that was constant while she was shopping.  It worsened to a point where she started sweating and feeling clammy.  At worst she states it was an 8/10.  She also notes she experienced some "fogginess" and walked down the same mile multiple times while in the store and does not really remember much of her shopping trip until she got to the checkout.  She feels back to normal now and denies a true syncopal episode, or any focal or persistent weakness following this.  Her  symptoms today were similar to what she experienced in the past before she had a stent placed, including the transient neurological symptoms.  Upon arrival to the emergency department her chest discomfort had traveled down her left arm with associated tingling.  At my time evaluation she is still experiencing chest pain that she rates a 6 out of 10 but is comfortable appearing, not in acute distress, able to speak in complete sentences and is otherwise hemodynamically stable.  Just before my arrival to the room she had some nitroglycerin ointment applied which she says is "easing the pain off" also has received a dose of IV morphine without much improvement.  She reports compliance with her medications with her last dose of Eliquis 5 mg this morning.   Vitals are notable for blood pressure of 152/111 at my time of evaluation, heart rate in the 60s to 70s in sinus rhythm on telemetry, she is comfortable on room air.  Labs notable for potassium of 4.2, BUN/creatinine 13/0.76 and GFR greater than 60.  High-sensitivity troponin negative on 2 repeats at 10, 8.  H&H stable at 14.5/44.6 and platelets 181,000.  Chest x-ray with no acute abnormalities.  Review of systems complete and found to be negative unless listed above     Past Medical History:  Diagnosis Date   Allergy    Anemia    Anxiety    Arrhythmia    Arthritis    Atrial fibrillation    CHF (congestive heart failure)    COPD (chronic  obstructive pulmonary disease)    Coronary artery disease    Depression    Diabetes mellitus without complication    Dyspnea    doe   Dysrhythmia    GERD (gastroesophageal reflux disease)    Headache    History of hiatal hernia    Hyperlipidemia    Hypertension    Myocardial infarction    2016, 04/2017   Myocardial infarction with cardiac rehabilitation    MI 2016/ CABG 8/17    FINISHED CARDIAC REHAB 3 WEEKS AGO   Panic attack    Pneumonia    Reflux    Stroke 2015   showed up on MRI; no weakness  noted   TIA (transient ischemic attack)    Voice tremor     Past Surgical History:  Procedure Laterality Date   ABDOMINAL HYSTERECTOMY     APPENDECTOMY  1975   ARTERY BIOPSY Right 04/26/2016   Procedure: BIOPSY TEMPORAL ARTERY;  Surgeon: Margaretha Sheffield, MD;  Location: [facility];  Service: ENT;  Laterality: Right;   CARDIAC CATHETERIZATION N/A 11/06/2015   Procedure: Left Heart Cath and Coronary Angiography;  Surgeon: Corey Skains, MD;  Location: [place] [facility];  Service: Cardiovascular;  Laterality: N/A;   CESAREAN SECTION     COLONOSCOPY  2015   COLONOSCOPY WITH PROPOFOL N/A 11/03/2018   Procedure: COLONOSCOPY WITH PROPOFOL;  Surgeon: Virgel Manifold, MD;  Location: [facility];  Service: Endoscopy;  Laterality: N/A;   CORONARY ANGIOPLASTY  04/2017   [place]   CORONARY ARTERY BYPASS GRAFT N/A 11/10/2015   Procedure: CORONARY ARTERY BYPASS GRAFTING (CABG), ON PUMP, TIMES FOUR, USING LEFT INTERNAL MAMMARY ARTERY, BILATERAL GREATER SAPHENOUS VEINS HARVESTED ENDOSCOPICALLY;  Surgeon: [name], MD;  Location: [place];  Service: Open Heart Surgery;  Laterality: N/A;  LIMA-LAD; SEQ SVG-OM1-OM2; SVG-PL   CORONARY STENT INTERVENTION N/A 08/05/2016   Procedure: Coronary Stent Intervention;  Surgeon: Isaias Cowman, MD;  Location: [place] [facility];  Service: Cardiovascular;  Laterality: N/A;   ESOPHAGOGASTRODUODENOSCOPY (EGD) WITH PROPOFOL N/A 11/03/2018   Procedure: ESOPHAGOGASTRODUODENOSCOPY (EGD) WITH PROPOFOL;  Surgeon: Virgel Manifold, MD;  Location: [facility] END[facility];  Service: Endoscopy;  Laterality: N/A;   HYSTERECTOMY ABDOMINAL WITH SALPINGO-OOPHORECTOMY Bilateral 08/15/2017   Procedure: HYSTERECTOMY ABDOMINAL WITH BILATERAL SALPINGO-OOPHORECTOMY;  Surgeon: Rubie Maid, MD;  Location: [facility];  Service: Gynecology;  Laterality: Bilateral;   LEFT HEART CATH AND CORONARY ANGIOGRAPHY N/A 08/05/2016   Procedure: Left Heart Cath and Coronary Angiography;   Surgeon: Isaias Cowman, MD;  Location: [place] [facility];  Service: Cardiovascular;  Laterality: N/A;   LEFT HEART CATH AND CORS/GRAFTS ANGIOGRAPHY N/A 09/19/2018   Procedure: LEFT HEART CATH AND CORS/GRAFTS ANGIOGRAPHY;  Surgeon: Corey Skains, MD;  Location: [place] [facility];  Service: Cardiovascular;  Laterality: N/A;   LEFT HEART CATH AND CORS/GRAFTS ANGIOGRAPHY N/A 08/30/2019   Procedure: LEFT HEART CATH AND CORS/GRAFTS ANGIOGRAPHY;  Surgeon: Teodoro Spray, MD;  Location: [place] [facility];  Service: Cardiovascular;  Laterality: N/A;   OOPHORECTOMY     TEE WITHOUT CARDIOVERSION N/A 11/10/2015   Procedure: TRANSESOPHAGEAL ECHOCARDIOGRAM (TEE);  Surgeon: [name], MD;  Location: [place];  Service: Open Heart Surgery;  Laterality: N/A;   TUBAL LIGATION      (Not in a hospital admission)  Social History   Socioeconomic History   Marital status: Divorced    Spouse name: Not on file   Number of children: 3   Years of education: Not  on file   Highest education level: Some college, no degree  Occupational History   Occupation: retired  Tobacco Use   Smoking status: Former    Packs/day: .5    Types: Cigarettes    Quit date: 10/07/2001    Years since quitting: 20.7   Smokeless tobacco: Never  Vaping Use   Vaping Use: Never used  Substance and Sexual Activity   Alcohol use: No    Alcohol/week: 0.0 standard drinks of alcohol   Drug use: No   Sexual activity: Not Currently  Other Topics Concern   Not on file  Social History Narrative   Lives at home alone   Social Determinants of Health   Financial Resource Strain: Low Risk  (03/26/2022)   Overall Financial Resource Strain (CARDIA)    Difficulty of Paying Living Expenses: Not hard at all  Food Insecurity: No Food Insecurity (03/26/2022)   Hunger Vital Sign    Worried About Running Out of Food in the Last Year: Never true    [place] in the Last Year: Never true  Transportation Needs: No  Transportation Needs (03/26/2022)   PRAPARE - Hydrologist (Medical): No    Lack of Transportation (Non-Medical): No  Physical Activity: Insufficiently Active (08/10/2021)   Exercise Vital Sign    Days of Exercise per Week: 2 days    Minutes of Exercise per Session: 20 min  Stress: No Stress Concern Present (03/26/2022)   [place]    Feeling of Stress : Not at all  Social Connections: Moderately Integrated (03/26/2022)   Social Connection and Isolation Panel [NHANES]    Frequency of Communication with Friends and Family: More than three times a week    Frequency of Social Gatherings with Friends and Family: Twice a week    Attends Religious Services: More than 4 times per year    Active Member of [employer] or Organizations: Yes    Attends Archivist Meetings: Never    Marital Status: Divorced  Human resources officer Violence: Not At Risk (08/10/2021)   Humiliation, Afraid, Rape, and Kick questionnaire    Fear of Current or Ex-Partner: No    Emotionally Abused: No    Physically Abused: No    Sexually Abused: No    Family History  Problem Relation Age of Onset   Cancer Father    Hypertension Father    Heart disease Father    Asthma Father    Cancer Mother    Hypertension Mother    Pancreatic cancer Mother 53   Cancer Sister    Breast cancer Sister 46   Breast cancer Sister 32   Lung cancer Brother    Pancreatic cancer Sister 26   Cancer Sister      No intake or output data in the 24 hours ending 06/30/22 1333  Vitals:   06/30/22 1134 06/30/22 1138 06/30/22 1200 06/30/22 1230  BP:  130/76 (!) 160/74 137/67  Pulse:   71 67  Resp:   17 10  Temp: 98.4 F (36.9 C)     TempSrc: Oral     SpO2:   96% 96%  Weight:      Height:        PHYSICAL EXAM General: Pleasant elderly Caucasian female, well nourished, in no acute distress.  Sitting upright in ED stretcher with daughter  [name]) at bedside HEENT:  Normocephalic and atraumatic. Neck:  No JVD.  Lungs: Normal  respiratory effort on room air.  Decreased breath sounds without appreciable crackles or wheezes. Heart: HRRR . Normal S1 and S2 without gallops or murmurs.  Abdomen: Soft, tenderness to palpation in the epigastrium and RUQ without rebound or guarding, non-distended appearing.  Msk: Normal strength and tone for age. Extremities: Warm and well perfused. No clubbing, cyanosis.  No peripheral edema.  Neuro: Alert and oriented X 3.  Generalized tremor to head and neck, slow speech cadence which is baseline per patient and daughter Psych:  Answers questions appropriately.   Labs: Basic Metabolic Panel: Recent Labs    06/30/22 1100  NA 140  K 4.2  CL 107  CO2 25  GLUCOSE 133*  BUN 13  CREATININE 0.76  CALCIUM 9.4   Liver Function Tests: No results for input(s): "AST", "ALT", "ALKPHOS", "BILITOT", "PROT", "ALBUMIN" in the last 72 hours. No results for input(s): "LIPASE", "AMYLASE" in the last 72 hours. CBC: Recent Labs    06/30/22 1100  WBC 6.7  HGB 14.5  HCT 44.6  MCV 97.8  PLT 181   Cardiac Enzymes: Recent Labs    06/30/22 1100  TROPONINIHS 10   BNP: No results for input(s): "BNP" in the last 72 hours. D-Dimer: No results for input(s): "DDIMER" in the last 72 hours. Hemoglobin A1C: No results for input(s): "HGBA1C" in the last 72 hours. Fasting Lipid Panel: No results for input(s): "CHOL", "HDL", "LDLCALC", "TRIG", "CHOLHDL", "LDLDIRECT" in the last 72 hours. Thyroid Function Tests: No results for input(s): "TSH", "T4TOTAL", "T3FREE", "THYROIDAB" in the last 72 hours.  Invalid input(s): "FREET3" Anemia Panel: No results for input(s): "VITAMINB12", "FOLATE", "FERRITIN", "TIBC", "IRON", "RETICCTPCT" in the last 72 hours.   Radiology: DG Chest 2 View  Result Date: 06/30/2022 CLINICAL DATA:  Dizziness, LEFT side chest pain radiating down LEFT arm. EXAM: CHEST - 2 VIEW COMPARISON:   03/25/2022 FINDINGS: Normal heart size post CABG Mediastinal contours and pulmonary vascularity normal. Atherosclerotic calcification aorta. Lungs clear. No pulmonary infiltrate, pleural effusion, or pneumothorax. Osseous demineralization with BILATERAL glenohumeral degenerative changes. IMPRESSION: Post CABG. No acute abnormalities. Aortic Atherosclerosis (ICD10-I70.0). Electronically Signed   By: Lavonia Dana M.D.   On: 06/30/2022 11:33    LHC 07/11/2020 at [facility] Findings:  1. Severe 3v CAD including 70-80% mid LAD, 60-70% diffuse LCx disease  (small vessel) and 90% distal RCA.   2. Patent SVG to RCA, Occluded SVG to OM (likely culprit), Atretic LIMA to  LAD  3. Normal left ventricular filling pressures (LVEDP = 15  mmHg)   Recommendations:  1. Aggressive secondary prevention.  2. Follow up with primary cardiologist.  3. Can consider PCI of LAD in the future if symptoms are not controlled  with medical management   ECHO 07/16/2021 1. Left ventricular ejection fraction, by estimation, is 55 to 60%. The  left ventricle has normal function. The left ventricle has no regional  wall motion abnormalities. Left ventricular diastolic parameters are  consistent with Grade I diastolic  dysfunction (impaired relaxation).   2. Right ventricular systolic function is normal. The right ventricular  size is normal.   3. The mitral valve is normal in structure. Trivial mitral valve  regurgitation.   4. The aortic valve is normal in structure. Aortic valve regurgitation is  not visualized.   TELEMETRY reviewed by me (LT) 06/30/2022 : Sinus rhythm rate 60s-70s  EKG reviewed by me: Sinus rhythm rate 74 bpm with baseline abnormalities with inferior and lateral ST depressions a chronic nonspecific IVCD, essentially unchanged from 03/25/2022.  Data reviewed by me (LT) 06/30/2022: Last outpatient cardiology note, last cardiac cath report from [facility], ED note, admission H&P, nursing notes last 24h vitals tele labs  imaging I/O   Principal Problem:   Chest pain Active Problems:   Essential hypertension   Gastroesophageal reflux disease   Mixed hyperlipidemia   Coronary artery disease   Type 2 diabetes mellitus with diabetic polyneuropathy, with long-term current use of insulin   Depression with anxiety   AF (paroxysmal atrial fibrillation)   Esophageal dysphagia   History of coronary artery bypass graft   Obesity (BMI 30-39.9)   DNR (do not resuscitate)    ASSESSMENT AND PLAN:  Candace [name] is a 64yoF with a PMH of severe 3vCAD s/p CABG x 4 2017 (atretic LIMA-LAD, occluded SVG to OM, patent SVG to RCA, with -70-80% mid LAD, 60-70% diffuse LCx disease (small vessel) and 90% distal RCA by [facility] 07/11/2020), HFpEF (EF 55-60%, G1 DD 07/16/2021), paroxysmal atrial fibrillation (Eliquis), hx TIA, DM2, essential tremor who presented to [facility] ED 06/30/2022 with chest pain that started while she was walking in the grocery store similar to her prior anginal pain.  Cardiology is consulted for further assistance.  # Unstable angina # Severe 3v CAD s/p CABG x 4 (2017) Presents with 8/10 central stabbing chest pain radiating down her left arm with associated nausea and diaphoresis consistent with her prior anginal pain, improvement from an 8/10 to 6/10 after IV Dilaudid and nitroglycerin ointment.  Notably, she was doing well and active without recurrent angina for at least a year, and was recently taken off of isosorbide last week (which she had been taking for many years) for ? decrease in pill burden.  Fortunately, troponins are negative on initial 2 checks at 10, 9.  Baseline EKG is abnormal with a chronic nonspecific IVCD/incomplete LBBB and inferolateral ST depressions that are essentially unchanged from prior EKG 4 months ago.  I offered the patient a Lexiscan Myoview for noninvasive ischemic evaluation, but she politely declined as she experienced severe GI upset/cramping last Myoview 06/2021. -Continue as needed  morphine, Nitropaste -Restart Imdur 90 mg daily -consider ranexa  -Continue aspirin 81 mg daily -Hold Eliquis (last dose morning of 4/3) and start heparin for stroke prophylaxis while inpatient -Continue Zetia, Crestor 40 mg daily -ECHO complete -Plan to continue pain control for now with the above medications. If her chest pain is refractory to medical management, consider LHC after appropriate ~48-hour washout from Eliquis  # HFpEF (EF 55-60%) Euvolemic on exam, chest x-ray clear. -Continue GDMT with metoprolol tartrate 100 mg twice daily, Entresto 24-26 mg twice daily, Jardiance 10 mg daily.  Continue Trulicity SQ injection at regular interval  # Paroxysmal AF In sinus rhythm on telemetry, metoprolol for rate control on Eliquis outpatient with heparin while hospitalized in anticipation of possible LHC.  # Epigastric/RUQ pain Reproducible to palpation, although the patient notes this is discretely different than the chest discomfort she experienced while in the grocery store earlier today. -Continue Protonix, consider further workup per primary team.  This patient's plan of care was discussed and created with Dr. Saralyn Pilar and he is in agreement.  Signed: Tristan Schroeder , PA-C 06/30/2022, 1:33 PM [facility]

## 2022-06-30 NOTE — ED Notes (Signed)
Cardiology PA at bedside. 

## 2022-06-30 NOTE — Progress Notes (Signed)
[place] for initiation and monitoring of heparin infusion Indication: atrial fibrillation  Allergies  Allergen Reactions   Tramadol Other (See Comments)    Causes patient to be off balance and Mental Changes   Lisinopril Cough   Penicillins Swelling, Rash and Other (See Comments)    Did it involve swelling of the face/tongue/throat, SOB, or low BP? Yes Did it involve sudden or severe rash/hives, skin peeling, or any reaction on the inside of your mouth or nose? Yes Did you need to seek medical attention at a hospital or doctor's office? Yes When did it last happen?      15 years If all above answers are "NO", may proceed with cephalosporin use.     Patient Measurements: Height: 5\' 2"  (157.5 cm) Weight: 75.3 kg (166 lb) IBW/kg (Calculated) : 50.1 Heparin Dosing Weight: 66.4 kg  Vital Signs: Temp: 98.4 F (36.9 C) (04/03 1134) Temp Source: Oral (04/03 1134) BP: 152/101 (04/03 1430) Pulse Rate: 66 (04/03 1430)  Labs: Recent Labs    06/30/22 1100 06/30/22 1325  HGB 14.5  --   HCT 44.6  --   PLT 181  --   CREATININE 0.76  --   TROPONINIHS 10 8    Estimated Creatinine Clearance: 61.3 mL/min (by C-G formula based on SCr of 0.76 mg/dL).   Medical History: Past Medical History:  Diagnosis Date   Allergy    Anemia    Anxiety    Arrhythmia    Arthritis    Atrial fibrillation    CHF (congestive heart failure)    COPD (chronic obstructive pulmonary disease)    Coronary artery disease    Depression    Diabetes mellitus without complication    Dyspnea    doe   Dysrhythmia    GERD (gastroesophageal reflux disease)    Headache    History of hiatal hernia    Hyperlipidemia    Hypertension    Myocardial infarction    2016, 04/2017   Myocardial infarction with cardiac rehabilitation    MI 2016/ CABG 8/17    FINISHED CARDIAC REHAB 3 WEEKS AGO   Panic attack    Pneumonia    Reflux    Stroke 2015   showed up on MRI; no  weakness noted   TIA (transient ischemic attack)    Voice tremor     Medications:  Scheduled:   [START ON 07/01/2022] aspirin EC  81 mg Oral Daily   [START ON 07/01/2022] ezetimibe  10 mg Oral Daily   insulin aspart  0-15 Units Subcutaneous TID WC   insulin aspart  0-5 Units Subcutaneous QHS   [START ON 07/01/2022] insulin glargine-yfgn  12 Units Subcutaneous QHS   insulin glargine-yfgn  6 Units Subcutaneous QHS   isosorbide mononitrate  90 mg Oral Daily   [START ON 07/01/2022] metoprolol tartrate  100 mg Oral BID   pantoprazole  20 mg Oral BID   rosuvastatin  40 mg Oral QHS   [START ON 07/01/2022] sacubitril-valsartan  1 tablet Oral Daily   [START ON 07/01/2022] sertraline  100 mg Oral Daily    Assessment: 72 year old female with history of CAD status post CABG, LIMA to LAD, SVG to PDA and OM, DES to SVG in 2021, and balloon angioplasty at [facility] [place] 2023, anxiety, depression, hyperlipidemia, insulin-dependent diabetes mellitus, GERD, atrial fibrillation, on Eliquis. Last dose apixaban this morning at 0730   Goal of Therapy:  Heparin level 0.3-0.7 units/ml aPTT 66 - 102  seconds Monitor platelets by anticoagulation protocol: Yes   Plan:  ---Start heparin infusion at 1000 units/hr at next scheduled dose of apixaban (avoid bolus ISO recent apixaban administration) ---Check aPTT level in 8 hours after initiation and daily while on heparin ---Continue to monitor H&H and platelets  Dallie Piles 06/30/2022,3:12 PM

## 2022-06-30 NOTE — ED Triage Notes (Signed)
Pt to ED via [facility] from car. Pt reports tried to go to the grocery store and had an onset of dizziness and center/left sided CP. Pt has cardiac hx with stents placed. 342mg  ASA given PTA. EKG showed LBBB.   EMS VS HR 76 100% RA 129/72

## 2022-06-30 NOTE — ED Notes (Signed)
Admitting MD at bedside.

## 2022-06-30 NOTE — Assessment & Plan Note (Signed)
Home metoprolol to tartrate 100 mg p.o. twice daily resumed

## 2022-06-30 NOTE — Assessment & Plan Note (Signed)
Etiology workup in progress, given patient's extensive history of CAD with multiple coronary arteries with stenosis of varying degrees, ACS cannot be excluded at this time Continue to monitor high-sensitivity troponin level Complete echo ordered Symptomatic support: Nitroglycerin ointment every 6 hours as needed for chest pain, morphine 4 mg IV every 3 hours as needed for severe pain, 5 doses ordered; Dilaudid 1 mg IV every 4 hours as needed for pain not responsive to IV morphine, 3 doses ordered Cardiology has been consulted via secure chat and epic order Admit to telemetry cardiac, observation

## 2022-06-30 NOTE — ED Triage Notes (Signed)
Pt states onset of mid chest pain that began at 0845 with dizziness and shortness of breath. Pt states she was sweaty when pain began.

## 2022-06-30 NOTE — Hospital Course (Addendum)
[name] is a 72 year old female withLoney Hering history of CAD status post CABG, LIMA to LAD, SVG to PDA and OM, DES to SVG in 2021, and [facility], anxiety, depression, hyperlipidemia, insulin-dependent diabetes mellitus, GERD, atrial fibrillation, on Eliquis, mild expressive aphasia post CVA about 3 years ago, who presents to emergency department for chief concerns of chest pain, shortness of breath and diaphoresis while in her car trying to go to the grocery store. 04/03: High sensitive troponin was 10. EKG showed sinus rhythm with rate of 79, QTc 43, mild change in leads II with deeper ST depression when compared to her EKG. ED treatment: Ondansetron 4 mg IV one-time dose, sodium chloride 1 L bolus, morphine 2 mg IV. EMS gave ASA. Pt admitted to hospitalist service, cardiology consulted.  04/04: hypotensive intermittently without symptoms - reduced imdur, holding entresto and metoprolol. Persistent epigastric pain and sharp chest discomfort, ACS unlikely, plan CT abd/pelvis. UA mod leuks but no dysuria/frequency, await UCx. Repeat echo with preserved EF 65-70% without regional wall motion abnormalities, essentially unchanged from prior study 06/2021. CT Abd/Pelv (+)distended stomach, no other acute processes, (+)hepatic steatosis.  04/05: pain ***. Heparin gtt ***. Cardiology ***.    Consultants:  Cardiology  Procedures: None       ASSESSMENT & PLAN:   Principal Problem:   Chest pain Active Problems:   Essential hypertension   Type 2 diabetes mellitus with diabetic polyneuropathy, with long-term current use of insulin   Depression with anxiety   Gastroesophageal reflux disease   Mixed hyperlipidemia   Coronary artery disease   AF (paroxysmal atrial fibrillation)   Esophageal dysphagia   History of coronary artery bypass graft   Obesity (BMI 30-39.9)   Suprapubic abdominal pain  Chest pain, Unstable Angina w/ underlying CAD  Etiology workup  in progress, given patient's extensive history of CAD with multiple coronary arteries with stenosis of varying degrees, ACS cannot be excluded at this time Continue to monitor high-sensitivity troponin level Per cardiology, pt was offered the a Lexiscan Myoview for noninvasive ischemic evaluation, but she politely declined as she experienced severe GI upset/cramping last Myoview 06/2021. D/c nitropaste d/t hypotension and unlikely ACS Lowered dose imdur d/t hypotension Starting ranexa Continue aspirin 81 mg daily Hold Eliquis (last dose morning of 4/3) and start heparin for stroke prophylaxis while inpatient Continue Zetia, Crestor 40 mg daily Avoid opiates d/t hypotension If chest pain is refractory to medical management, consider LHC but no plan for that at this time   Coronary artery disease History of coronary artery bypass graft LIMA to LAD, SVG to PDA and OM in 2017 DES in 2021 and balloon angioplasty Status post aspirin 342 mg via EMS Resumed home aspirin 81 mg daily, rosuvastatin Holding metoprolol tartrate, Entresto d/t low BP Nitroglycerin as needed for chest pain ordered but d/c d/t low BP Prior Plavix 75 mg daily and Imdur 60 mg daily were not resumed on admission as these medications were discontinued by outpatient cardiology 1 week ago Cardiology following    Advance care planning On admission at bedside, patient reports that she would like 1 attempt at chest compressions, it was discussed that that would mean we would put her at full code, patient is in agreement with this   Suprapubic abdominal pain without urinary complaints  UA (+)Leuk but min WBC, no nitrite  UCx   Type 2 diabetes mellitus with diabetic polyneuropathy, with long-term current use of insulin Patient n.p.o. and decreased p.o.  intake on day of admission therefore long-acting insulin was decreased to 6 units nightly one-time dose Long-acting insulin 12 units subcutaneous nightly resumed for  07/01/2022 Insulin SSI with agents coverage ordered  AF (paroxysmal atrial fibrillation) Home Eliquis 5 mg p.o. twice daily held for now in case needs LHC Continue heparin  metoprolol tartrate 100 mg p.o. twice daily rate control held d/t low BP  Essential hypertension Home metoprolol to tartrate 100 mg p.o. twice daily held d/t low BP  Mixed hyperlipidemia Rosuvastatin 40 mg nightly ezetimibe 10 mg daily  Gastroesophageal reflux disease Home PPI twice daily resumed  Esophageal dysphagia Home PPI twice daily   Depression with anxiety home sertraline 100 mg daily    DVT prophylaxis: heparin Pertinent IV fluids/nutrition: no continuous fluids  Central lines / invasive devices: none   Code Status: FULL CODE   Current Admission Status: inpatient   TOC needs / Dispo plan: pending Barriers to discharge / significant pending items: clinical improvement in BP, eval abdominal/pelvic pain

## 2022-06-30 NOTE — Assessment & Plan Note (Signed)
LIMA to LAD, SVG to PDA and OM in 2017

## 2022-06-30 NOTE — Assessment & Plan Note (Addendum)
Status post CABG in 2017 and DES in 2021 and balloon angioplasty Status post aspirin 342 mg via EMS Resumed home aspirin 81 mg daily, rosuvastatin, metoprolol tartrate, Entresto Nitroglycerin as needed for chest pain ordered Prior Plavix 75 mg daily and Imdur 60 mg daily were not resumed on admission as these medications were discontinued by outpatient cardiology 1 week ago

## 2022-06-30 NOTE — ED Provider Notes (Signed)
[facility] Provider Note    Event Date/Time   First MD Initiated Contact with Patient 06/30/22 1053     (approximate)   History   Chest Pain   HPI  Candace [name] is a 72 y.o. female with an extensive cardiac history status post stents as well as CABG presents to the ER for evaluation of midsternal chest pain and tightness associate with diaphoresis radiating to her left shoulder associate with nausea.  Symptoms started this morning.  Got worse when she was out shopping.  Rates the pain is mild to moderate now having a lot of nausea with it.  Was reportedly diaphoretic with EMS and that has since resolved.  Was given aspirin.  She been compliant with her medications.     Physical Exam   Triage Vital Signs: ED Triage Vitals  Enc Vitals Group     BP --      Pulse --      Resp 06/30/22 1045 (!) 22     Temp --      Temp Source 06/30/22 1045 Oral     SpO2 --      Weight 06/30/22 1044 166 lb (75.3 kg)     Height 06/30/22 1044 5\' 2"  (1.575 m)     Head Circumference --      Peak Flow --      Pain Score 06/30/22 1044 7     Pain Loc --      Pain Edu? --      Excl. in [place]? --     Most recent vital signs: Vitals:   06/30/22 1134 06/30/22 1138  BP:  130/76  Pulse:    Resp:    Temp: 98.4 F (36.9 C)   SpO2:       Constitutional: Alert  Eyes: Conjunctivae are normal.  Head: Atraumatic. Nose: No congestion/rhinnorhea. Mouth/Throat: Mucous membranes are moist.   Neck: Painless ROM.  Cardiovascular:   Good peripheral circulation. Respiratory: Normal respiratory effort.  No retractions.  Gastrointestinal: Soft and nontender.  Musculoskeletal:  no deformity Neurologic:  MAE spontaneously. No gross focal neurologic deficits are appreciated.  Skin:  Skin is warm, dry and intact. No rash noted. Psychiatric: Mood and affect are normal. Speech and behavior are normal.    ED Results / Procedures / Treatments   Labs (all labs ordered are listed,  but only abnormal results are displayed) Labs Reviewed  BASIC METABOLIC PANEL - Abnormal; Notable for the following components:      Result Value   Glucose, Bld 133 (*)    All other components within normal limits  CBC  TROPONIN I (HIGH SENSITIVITY)  TROPONIN I (HIGH SENSITIVITY)     EKG  ED ECG REPORT I, Merlyn Lot, the attending physician, personally viewed and interpreted this ECG.   Date: 06/30/2022  EKG Time: 10:47  Rate: 80  Rhythm: sinus  Axis: normal  Intervals: normal  ST&T Change: inferolateral st abn slightly changed from previous but overall morphology similar    RADIOLOGY Please see ED Course for my review and interpretation.  I personally reviewed all radiographic images ordered to evaluate for the above acute complaints and reviewed radiology reports and findings.  These findings were personally discussed with the patient.  Please see medical record for radiology report.    PROCEDURES:  Critical Care performed: No  Procedures   MEDICATIONS ORDERED IN ED: Medications  morphine (PF) 2 MG/ML injection 2 mg (2 mg Intravenous Given 06/30/22 1147)  nitroGLYCERIN (NITROSTAT) SL tablet 0.4 mg (has no administration in time range)  ondansetron (ZOFRAN) injection 4 mg (4 mg Intravenous Given 06/30/22 1145)  sodium chloride 0.9 % bolus 1,000 mL (1,000 mLs Intravenous New Bag/Given 06/30/22 1153)     IMPRESSION / MDM / ASSESSMENT AND PLAN / ED COURSE  I reviewed the triage vital signs and the nursing notes.                              Differential diagnosis includes, but is not limited to, ACS, pericarditis, esophagitis, boerhaaves, pe, dissection, pna, bronchitis, costochondritis  Patient presenting to the ER for evaluation of symptoms as described above.  Based on symptoms, risk factors and considered above differential, this presenting complaint could reflect a potentially life-threatening illness therefore the patient will be placed on continuous pulse  oximetry and telemetry for monitoring.  Laboratory evaluation will be sent to evaluate for the above complaints.      Clinical Course as of 06/30/22 1222  Wed Jun 30, 2022  1139 Chest x-ray my review and interpretation without evidence of consolidation or pneumothorax. [PR]  1203 Troponin is negative patient still reporting moderate pain pressure.  I am concerned for unstable angina she is already on Eliquis therefore no indication for heparin at this time.  Will give additional nitrates.  Given her age risk factors do feel that she will require admission for high risk chest pain.  Will consult hospitalist for admission [PR]    Clinical Course User Index [PR] Merlyn Lot, MD     FINAL CLINICAL IMPRESSION(S) / ED DIAGNOSES   Final diagnoses:  Unstable angina     Rx / DC Orders   ED Discharge Orders     None        Note:  This document was prepared using Dragon voice recognition software and may include unintentional dictation errors.    Merlyn Lot, MD 06/30/22 [phone]

## 2022-06-30 NOTE — Assessment & Plan Note (Signed)
Check a UA, if positive will initiate appropriate antibiotic therapy

## 2022-06-30 NOTE — Assessment & Plan Note (Signed)
Rosuvastatin 40 mg nightly, ezetimibe 10 mg daily were resumed

## 2022-06-30 NOTE — ED Notes (Signed)
EKG given to dr. Quentin Cornwall. Pt taken to room 17, met by primary RN. Placed on cardiac monitor.

## 2022-06-30 NOTE — ED Notes (Signed)
Pt to xray

## 2022-06-30 NOTE — H&P (Addendum)
History and Physical   LABREESKA FINBERG [id number] DOB: April 15, 1950 DOA: 06/30/2022  PCP: Eulas Post, MD  Outpatient Specialists: Dr. [facility] cardiology Patient coming from: grocery store via EMS  I have personally briefly reviewed patient's old medical records in [place].  Chief Concern: Chest pain, shortness of breath  HPI: Ms. Rolande Sjostrom is a 72 year old female with history of CAD status post CABG, LIMA to LAD, SVG to PDA and OM, DES to SVG in 2021, and balloon angioplasty at [facility] 2023, anxiety, depression, hyperlipidemia, insulin-dependent diabetes mellitus, GERD, atrial fibrillation, on Eliquis, mild expressive aphasia post CVA about 3 years ago, who presents to emergency department for chief concerns of chest pain, shortness of breath and diaphoresis while in her car trying to go to the grocery store.  Temperature was 98.4, respiration rate 22, heart rate 77, blood pressure 136/115, SpO2 of 96% on room air.  Serum sodium is 140, potassium 4.2, chloride 107, bicarb 25, BUN of 13, serum creatinine 0.76, EGFR greater than 60, nonfasting blood glucose 133, WBC 6.7, hemoglobin 14.5, platelets 181.  High sensitive troponin was 10.  EKG showed sinus rhythm with rate of 79, QTc 43, mild change in leads II with deeper ST depression when compared to her EKG.  ED treatment: Ondansetron 4 mg IV one-time dose, sodium chloride 1 L bolus, morphine 2 mg IV ---------------------------- At bedside, she is able to tell me her name, age, current year, current location.   She ate yogurt at around 7:30 am. She went to grocery, after getting out of the car, approximatley 8:45a, she developed diaphoresis, sharp stabbing pain. She continued to walk to in the grocery, she developed weakness, nausea, and dizziness. She felt like she was in a fog. She picked up a few items and she didn't know where she was going. She also noted matallic taste in her mouth.    As she continued to walk in the grocery store, she reports the pain developed into a pressure pain with radiation down her left arm and worsening shortness of breath. She points the pain is epigastric in origin.   She reports this episode is similar to prior episode of chest pain in 2021 and 2022.   At bedside, she reports she is still having chest pressure 7 out of 10.  She reports the shortness of breath has improved since laying in the ED bed.  Of note: Daughter reports that patient's imdur and plavix were discontinued last week per cardiology.   Social history: She lives on her own. She is a former tobacco user, quitting 2003, at her peak, she smoked 1/2 per day. She denies etoh, and recreational drug use. She is retired.   ROS: Constitutional: no weight change, no fever ENT/Mouth: no sore throat, no rhinorrhea Eyes: no eye pain, no vision changes Cardiovascular: + chest pain, + dyspnea,  no edema, no palpitations Respiratory: no cough, no sputum, no wheezing Gastrointestinal: no nausea, no vomiting, no diarrhea, no constipation Genitourinary: no urinary incontinence, no dysuria, no hematuria Musculoskeletal: no arthralgias, no myalgias Skin: no skin lesions, no pruritus, Neuro: + weakness, no loss of consciousness, no syncope Psych: no anxiety, no depression, + decrease appetite Heme/Lymph: no bruising, no bleeding  ED Course: Discussed with emergency medicine provider, patient requiring hospitalization for chief concerns of chest pain and diaphoresis.  Assessment/Plan  Principal Problem:   Chest pain Active Problems:   Essential hypertension   Type 2 diabetes mellitus with diabetic polyneuropathy,  with long-term current use of insulin   Depression with anxiety   Gastroesophageal reflux disease   Mixed hyperlipidemia   Coronary artery disease   AF (paroxysmal atrial fibrillation)   Esophageal dysphagia   History of coronary artery bypass graft   Obesity (BMI  30-39.9)   Suprapubic abdominal pain   Assessment and Plan:  * Chest pain Etiology workup in progress, given patient's extensive history of CAD with multiple coronary arteries with stenosis of varying degrees, ACS cannot be excluded at this time Continue to monitor high-sensitivity troponin level Complete echo ordered Symptomatic support: Nitroglycerin ointment every 6 hours as needed for chest pain, morphine 4 mg IV every 3 hours as needed for severe pain, 5 doses ordered; Dilaudid 1 mg IV every 4 hours as needed for pain not responsive to IV morphine, 3 doses ordered Cardiology has been consulted via secure chat and epic order Admit to telemetry cardiac, observation  Essential hypertension Home metoprolol to tartrate 100 mg p.o. twice daily resumed  Depression with anxiety Resumed home sertraline 100 mg daily  Type 2 diabetes mellitus with diabetic polyneuropathy, with long-term current use of insulin Patient is currently n.p.o. and decreased p.o. intake on day of admission therefore long-acting insulin has been decreased to 6 units nightly one-time dose Long-acting insulin 12 units subcutaneous nightly resumed for 07/01/2022 Insulin SSI with agents coverage ordered Go inpatient blood glucose levels 140-170  Suprapubic abdominal pain Check a UA, if positive will initiate appropriate antibiotic therapy  DNR (do not resuscitate) Today at bedside, patient reports that she would like 1 attempt at chest compressions I discussed that that would mean we would put her at full code, patient is in agreement with this for today.  History of coronary artery bypass graft LIMA to LAD, SVG to PDA and OM in 2017  Esophageal dysphagia Home PPI twice daily resumed  AF (paroxysmal atrial fibrillation) Home Eliquis 5 mg p.o. twice daily, metoprolol tartrate 100 mg p.o. twice daily were resumed  Coronary artery disease Status post CABG in 2017 and DES in 2021 and balloon angioplasty Status post  aspirin 342 mg via EMS Resumed home aspirin 81 mg daily, rosuvastatin, metoprolol tartrate, Entresto Nitroglycerin as needed for chest pain ordered Prior Plavix 75 mg daily and Imdur 60 mg daily were not resumed on admission as these medications were discontinued by outpatient cardiology 1 week ago  Mixed hyperlipidemia Rosuvastatin 40 mg nightly, ezetimibe 10 mg daily were resumed  Gastroesophageal reflux disease Home PPI twice daily resumed  Patient has MOST form and ACP documents.  I discussed the form with patient at bedside.  She states that today she would like to have 1 attempt at chest compression.  I discussed that we will respect her wishes and this would mean that DNR would be removed from her CODE STATUS on this admission today.  Patient is in agreement with this.  Daughter was at bedside.  Chart reviewed.   DVT prophylaxis: eliquis Code Status: full code  Diet: N.p.o. pain during cardiology evaluation Family Communication: updated daughter at bedside. Disposition Plan: Pending clinical course Consults called: Cardiology Admission status: Telemetry cardiac, observation  Past Medical History:  Diagnosis Date   Allergy    Anemia    Anxiety    Arrhythmia    Arthritis    Atrial fibrillation    CHF (congestive heart failure)    COPD (chronic obstructive pulmonary disease)    Coronary artery disease    Depression    Diabetes mellitus without  complication    Dyspnea    doe   Dysrhythmia    GERD (gastroesophageal reflux disease)    Headache    History of hiatal hernia    Hyperlipidemia    Hypertension    Myocardial infarction    2016, 04/2017   Myocardial infarction with cardiac rehabilitation    MI 2016/ CABG 8/17    FINISHED CARDIAC REHAB 3 WEEKS AGO   Panic attack    Pneumonia    Reflux    Stroke 2015   showed up on MRI; no weakness noted   TIA (transient ischemic attack)    Voice tremor    Past Surgical History:  Procedure Laterality Date   ABDOMINAL  HYSTERECTOMY     APPENDECTOMY  1975   ARTERY BIOPSY Right 04/26/2016   Procedure: BIOPSY TEMPORAL ARTERY;  Surgeon: Margaretha Sheffield, MD;  Location: [facility];  Service: ENT;  Laterality: Right;   CARDIAC CATHETERIZATION N/A 11/06/2015   Procedure: Left Heart Cath and Coronary Angiography;  Surgeon: Corey Skains, MD;  Location: [place] [facility];  Service: Cardiovascular;  Laterality: N/A;   CESAREAN SECTION     COLONOSCOPY  2015   COLONOSCOPY WITH PROPOFOL N/A 11/03/2018   Procedure: COLONOSCOPY WITH PROPOFOL;  Surgeon: Virgel Manifold, MD;  Location: [facility];  Service: Endoscopy;  Laterality: N/A;   CORONARY ANGIOPLASTY  04/2017   [place]   CORONARY ARTERY BYPASS GRAFT N/A 11/10/2015   Procedure: CORONARY ARTERY BYPASS GRAFTING (CABG), ON PUMP, TIMES FOUR, USING LEFT INTERNAL MAMMARY ARTERY, BILATERAL GREATER SAPHENOUS VEINS HARVESTED ENDOSCOPICALLY;  Surgeon: [name], MD;  Location: [place];  Service: Open Heart Surgery;  Laterality: N/A;  LIMA-LAD; SEQ SVG-OM1-OM2; SVG-PL   CORONARY STENT INTERVENTION N/A 08/05/2016   Procedure: Coronary Stent Intervention;  Surgeon: Isaias Cowman, MD;  Location: [place] [facility];  Service: Cardiovascular;  Laterality: N/A;   ESOPHAGOGASTRODUODENOSCOPY (EGD) WITH PROPOFOL N/A 11/03/2018   Procedure: ESOPHAGOGASTRODUODENOSCOPY (EGD) WITH PROPOFOL;  Surgeon: Virgel Manifold, MD;  Location: [facility];  Service: Endoscopy;  Laterality: N/A;   HYSTERECTOMY ABDOMINAL WITH SALPINGO-OOPHORECTOMY Bilateral 08/15/2017   Procedure: HYSTERECTOMY ABDOMINAL WITH BILATERAL SALPINGO-OOPHORECTOMY;  Surgeon: Rubie Maid, MD;  Location: [facility];  Service: Gynecology;  Laterality: Bilateral;   LEFT HEART CATH AND CORONARY ANGIOGRAPHY N/A 08/05/2016   Procedure: Left Heart Cath and Coronary Angiography;  Surgeon: Isaias Cowman, MD;  Location: [place] [facility];  Service: Cardiovascular;  Laterality: N/A;   LEFT HEART  CATH AND CORS/GRAFTS ANGIOGRAPHY N/A 09/19/2018   Procedure: LEFT HEART CATH AND CORS/GRAFTS ANGIOGRAPHY;  Surgeon: Corey Skains, MD;  Location: [place] [facility];  Service: Cardiovascular;  Laterality: N/A;   LEFT HEART CATH AND CORS/GRAFTS ANGIOGRAPHY N/A 08/30/2019   Procedure: LEFT HEART CATH AND CORS/GRAFTS ANGIOGRAPHY;  Surgeon: Teodoro Spray, MD;  Location: [place] [facility];  Service: Cardiovascular;  Laterality: N/A;   OOPHORECTOMY     TEE WITHOUT CARDIOVERSION N/A 11/10/2015   Procedure: TRANSESOPHAGEAL ECHOCARDIOGRAM (TEE);  Surgeon: [name], MD;  Location: [place];  Service: Open Heart Surgery;  Laterality: N/A;   TUBAL LIGATION     Social History:  reports that she quit smoking about 20 years ago. Her smoking use included cigarettes. She smoked an average of .5 packs per day. She has never used smokeless tobacco. She reports that she does not drink alcohol and does not use drugs.  Allergies  Allergen Reactions   Tramadol Other (See Comments)  Causes patient to be off balance and Mental Changes   Lisinopril Cough   Penicillins Swelling, Rash and Other (See Comments)    Did it involve swelling of the face/tongue/throat, SOB, or low BP? Yes Did it involve sudden or severe rash/hives, skin peeling, or any reaction on the inside of your mouth or nose? Yes Did you need to seek medical attention at a hospital or doctor's office? Yes When did it last happen?      15 years If all above answers are "NO", may proceed with cephalosporin use.    Family History  Problem Relation Age of Onset   Cancer Father    Hypertension Father    Heart disease Father    Asthma Father    Cancer Mother    Hypertension Mother    Pancreatic cancer Mother 38   Cancer Sister    Breast cancer Sister 27   Breast cancer Sister 82   Lung cancer Brother    Pancreatic cancer Sister 27   Cancer Sister    Family history: Family history reviewed and not pertinent  Prior to Admission  medications   Medication Sig Start Date End Date Taking? Authorizing Provider  albuterol (VENTOLIN HFA) 108 (90 Base) MCG/ACT inhaler Inhale 2 puffs into the lungs every 6 (six) hours as needed for wheezing or shortness of breath. 01/07/21  Yes Eulas Post, MD  Dulaglutide (TRULICITY) 1.5 0000000 SOPN Inject 1.5 mg into the skin once a week. 01/28/22  Yes Mikey Kirschner, PA-C  empagliflozin (JARDIANCE) 25 MG TABS tablet TAKE ONE TABLET BY MOUTH BEFORE BREAKFAST 02/12/22  Yes Drubel, Ria Comment, PA-C  ezetimibe (ZETIA) 10 MG tablet Take 1 tablet (10 mg total) by mouth daily. 01/19/22  Yes Drubel, Ria Comment, PA-C  acetaminophen (TYLENOL) 325 MG tablet Take 650 mg by mouth every 6 (six) hours as needed for moderate pain or headache.     [provider]  ALPRAZolam Duanne Moron) 0.25 MG tablet Take 1 tablet (0.25 mg total) by mouth every 8 (eight) hours as needed for anxiety. Patient not taking: Reported on 06/30/2022 07/30/21   Eulas Post, MD  Blood Glucose Monitoring Suppl [facility] VERIO) w/Device KIT Use daily to check blood sugar 03/05/21   Virginia Crews, MD  clopidogrel (PLAVIX) 75 MG tablet Take 1 tablet (75 mg total) by mouth daily. Patient not taking: Reported on 06/30/2022 02/12/22   Mikey Kirschner, PA-C  COMFORT EZ PEN NEEDLES 32G X 4 MM MISC Use to inject insulin daily 01/04/22   Gwyneth Sprout, FNP  ELIQUIS 5 MG TABS tablet Take 5 mg by mouth 2 (two) times daily. 03/26/20   [provider]  glucose blood test strip Use to check blood sugars daily as instructed 03/02/22   Simmons-Robinson, Makiera, MD  insulin glargine (LANTUS SOLOSTAR) 100 UNIT/ML Solostar Pen Inject 12 Units into the skin at bedtime. Patient taking differently: Inject 12 Units into the skin at bedtime. 08/21/21   Jennye Boroughs, MD  isosorbide mononitrate (IMDUR) 60 MG 24 hr tablet Take 1 tablet (60 mg total) by mouth daily. 09/30/21   Minna Merritts, MD  Lancets (ONETOUCH DELICA PLUS [id number]) MISC  USE UP TO 4 TIMES DAILY AS DIRECTED 07/10/19   Eulas Post, MD  loperamide (IMODIUM) 2 MG capsule Take 2 mg by mouth daily as needed for diarrhea or loose stools.    [provider]  metoprolol tartrate (LOPRESSOR) 100 MG tablet Take 100 mg by mouth 2 (two) times daily. 10/03/18  [provider]  nitroGLYCERIN (NITROSTAT) 0.4 MG SL tablet Place 1 tablet (0.4 mg total) under the tongue every 5 (five) minutes as needed for chest pain. 07/27/16   Bettey Costa, MD  nystatin (NYSTATIN) powder Apply 1 application topically daily. Patient taking differently: Apply 1 application  topically as needed. 02/14/20   Rubie Maid, MD  ondansetron (ZOFRAN-ODT) 8 MG disintegrating tablet TAKE ONE TABLET BY MOUTH EVERY 8 HOURS AS NEEDED FOR NAUSEA AND VOMITING 10/14/21   Eulas Post, MD  pantoprazole (PROTONIX) 20 MG tablet Take 1 tablet (20 mg total) by mouth 2 (two) times daily. 05/15/21   Eulas Post, MD  rosuvastatin (CRESTOR) 40 MG tablet TAKE ONE TABLET BY MOUTH ONCE DAILY 01/04/22   Drubel, Ria Comment, PA-C  sacubitril-valsartan (ENTRESTO) 24-26 MG Take 1 tablet by mouth daily. 09/30/21   Minna Merritts, MD  sertraline (ZOLOFT) 100 MG tablet Take 1 tablet (100 mg total) by mouth daily. 01/19/22   Mikey Kirschner, PA-C  cetirizine (ZYRTEC) 5 MG tablet Take 2 tablets (10 mg total) by mouth daily. 12/19/18 02/06/20  Eulas Post, MD   Physical Exam: Vitals:   06/30/22 1200 06/30/22 1230 06/30/22 1300 06/30/22 1330  BP: (!) 160/74 137/67 (!) 114/92 (!) 139/97  Pulse: 71 67 69 64  Resp: 17 10 13 19   Temp:      TempSrc:      SpO2: 96% 96% 98% 99%  Weight:      Height:       Constitutional: appears age appropriate, NAD, calm, comfortable Eyes: PERRL, lids and conjunctivae normal ENMT: Mucous membranes are moist. Posterior pharynx clear of any exudate or lesions. Age-appropriate dentition. Hearing appropriate Neck: normal, supple, no masses, no thyromegaly Respiratory:  clear to auscultation bilaterally, no wheezing, no crackles. Normal respiratory effort. No accessory muscle use.  Cardiovascular: Regular rate and rhythm, no murmurs / rubs / gallops. No extremity edema. 2+ pedal pulses. No carotid bruits.  Abdomen: + suprapubic tenderness, obese abdomen, no masses palpated, no hepatosplenomegaly. Bowel sounds positive.  Musculoskeletal: no clubbing / cyanosis. No joint deformity upper and lower extremities. Good ROM, no contractures, no atrophy. Normal muscle tone.  Skin: no rashes, lesions, ulcers. No induration.  Vertical mid upper scar appears well-healed consistent with history of CABG Neurologic: Sensation intact. Strength 5/5 in all 4. Baseline mild expressive aphasia post CVA about 3 years ago per daughter at bedside Psychiatric: Normal judgment and insight. Alert and oriented x 3.  Flat affect.    EKG: independently reviewed, showing sinus rhythm with rate of 78, QTc 483  Chest x-ray on Admission: I personally reviewed and I agree with radiologist reading as below.  DG Chest 2 View  Result Date: 06/30/2022 CLINICAL DATA:  Dizziness, LEFT side chest pain radiating down LEFT arm. EXAM: CHEST - 2 VIEW COMPARISON:  03/25/2022 FINDINGS: Normal heart size post CABG Mediastinal contours and pulmonary vascularity normal. Atherosclerotic calcification aorta. Lungs clear. No pulmonary infiltrate, pleural effusion, or pneumothorax. Osseous demineralization with BILATERAL glenohumeral degenerative changes. IMPRESSION: Post CABG. No acute abnormalities. Aortic Atherosclerosis (ICD10-I70.0). Electronically Signed   By: Lavonia Dana M.D.   On: 06/30/2022 11:33    Labs on Admission: I have personally reviewed following labs CBC: Recent Labs  Lab 06/30/22 1100  WBC 6.7  HGB 14.5  HCT 44.6  MCV 97.8  PLT 0000000   Basic Metabolic Panel: Recent Labs  Lab 06/30/22 1100  NA 140  K 4.2  CL 107  CO2 25  GLUCOSE  133*  BUN 13  CREATININE 0.76  CALCIUM 9.4    GFR: Estimated Creatinine Clearance: 61.3 mL/min (by C-G formula based on SCr of 0.76 mg/dL).  Urine analysis:    Component Value Date/Time   COLORURINE YELLOW 02/09/2021 1053   APPEARANCEUR CLEAR 02/09/2021 1053   APPEARANCEUR Clear 12/24/2014 0905   LABSPEC 1.020 02/09/2021 1053   LABSPEC 1.030 09/14/2013 2123   PHURINE 6.5 02/09/2021 1053   GLUCOSEU >=500 (A) 02/09/2021 1053   GLUCOSEU Negative 09/14/2013 2123   HGBUR NEGATIVE 02/09/2021 1053   BILIRUBINUR NEGATIVE 02/09/2021 1053   BILIRUBINUR Negative 02/06/2020 1014   BILIRUBINUR Negative 12/24/2014 0905   BILIRUBINUR Negative 09/14/2013 2123   KETONESUR NEGATIVE 02/09/2021 1053   PROTEINUR NEGATIVE 02/09/2021 1053   UROBILINOGEN 0.2 02/06/2020 1014   NITRITE NEGATIVE 02/09/2021 1053   LEUKOCYTESUR TRACE (A) 02/09/2021 1053   LEUKOCYTESUR 3+ 09/14/2013 2123   CRITICAL CARE Performed by: Dr. Tobie Poet  Total critical care time: 35 minutes  Critical care time was exclusive of separately billable procedures and treating other patients.  Critical care was necessary to treat or prevent imminent or life-threatening deterioration.  Critical care was time spent personally by me on the following activities: development of treatment plan with patient and/or surrogate as well as nursing, discussions with consultants, evaluation of patient's response to treatment, examination of patient, obtaining history from patient or surrogate, ordering and performing treatments and interventions, ordering and review of laboratory studies, ordering and review of radiographic studies, pulse oximetry and re-evaluation of patient's condition.  This document was prepared using Dragon Voice Recognition software and may include unintentional dictation errors.  Dr. Tobie Poet [facility]  If 7PM-7AM, please contact overnight-coverage provider If 7AM-7PM, please contact day coverage provider [email or web address]  06/30/2022, 2:14 PM

## 2022-07-01 ENCOUNTER — Inpatient Hospital Stay: Payer: 59

## 2022-07-01 DIAGNOSIS — I5032 Chronic diastolic (congestive) heart failure: Secondary | ICD-10-CM | POA: Diagnosis present

## 2022-07-01 DIAGNOSIS — E669 Obesity, unspecified: Secondary | ICD-10-CM | POA: Diagnosis present

## 2022-07-01 DIAGNOSIS — E782 Mixed hyperlipidemia: Secondary | ICD-10-CM

## 2022-07-01 DIAGNOSIS — Z951 Presence of aortocoronary bypass graft: Secondary | ICD-10-CM

## 2022-07-01 DIAGNOSIS — I25118 Atherosclerotic heart disease of native coronary artery with other forms of angina pectoris: Secondary | ICD-10-CM | POA: Diagnosis not present

## 2022-07-01 DIAGNOSIS — F419 Anxiety disorder, unspecified: Secondary | ICD-10-CM | POA: Diagnosis present

## 2022-07-01 DIAGNOSIS — Z79899 Other long term (current) drug therapy: Secondary | ICD-10-CM | POA: Diagnosis not present

## 2022-07-01 DIAGNOSIS — K219 Gastro-esophageal reflux disease without esophagitis: Secondary | ICD-10-CM

## 2022-07-01 DIAGNOSIS — R1314 Dysphagia, pharyngoesophageal phase: Secondary | ICD-10-CM | POA: Diagnosis present

## 2022-07-01 DIAGNOSIS — E1142 Type 2 diabetes mellitus with diabetic polyneuropathy: Secondary | ICD-10-CM | POA: Diagnosis present

## 2022-07-01 DIAGNOSIS — Z794 Long term (current) use of insulin: Secondary | ICD-10-CM

## 2022-07-01 DIAGNOSIS — R102 Pelvic and perineal pain: Secondary | ICD-10-CM | POA: Diagnosis present

## 2022-07-01 DIAGNOSIS — I69320 Aphasia following cerebral infarction: Secondary | ICD-10-CM | POA: Diagnosis not present

## 2022-07-01 DIAGNOSIS — I200 Unstable angina: Secondary | ICD-10-CM | POA: Diagnosis present

## 2022-07-01 DIAGNOSIS — I110 Hypertensive heart disease with heart failure: Secondary | ICD-10-CM | POA: Diagnosis present

## 2022-07-01 DIAGNOSIS — I480 Paroxysmal atrial fibrillation: Secondary | ICD-10-CM

## 2022-07-01 DIAGNOSIS — R1319 Other dysphagia: Secondary | ICD-10-CM

## 2022-07-01 DIAGNOSIS — I959 Hypotension, unspecified: Secondary | ICD-10-CM | POA: Diagnosis not present

## 2022-07-01 DIAGNOSIS — F418 Other specified anxiety disorders: Secondary | ICD-10-CM

## 2022-07-01 DIAGNOSIS — I10 Essential (primary) hypertension: Secondary | ICD-10-CM

## 2022-07-01 DIAGNOSIS — Z66 Do not resuscitate: Secondary | ICD-10-CM | POA: Diagnosis present

## 2022-07-01 DIAGNOSIS — I25110 Atherosclerotic heart disease of native coronary artery with unstable angina pectoris: Secondary | ICD-10-CM | POA: Diagnosis present

## 2022-07-01 DIAGNOSIS — Z87891 Personal history of nicotine dependence: Secondary | ICD-10-CM | POA: Diagnosis not present

## 2022-07-01 DIAGNOSIS — J449 Chronic obstructive pulmonary disease, unspecified: Secondary | ICD-10-CM | POA: Diagnosis present

## 2022-07-01 DIAGNOSIS — Z8249 Family history of ischemic heart disease and other diseases of the circulatory system: Secondary | ICD-10-CM | POA: Diagnosis not present

## 2022-07-01 DIAGNOSIS — F32A Depression, unspecified: Secondary | ICD-10-CM | POA: Diagnosis present

## 2022-07-01 DIAGNOSIS — Z7984 Long term (current) use of oral hypoglycemic drugs: Secondary | ICD-10-CM | POA: Diagnosis not present

## 2022-07-01 DIAGNOSIS — Z7901 Long term (current) use of anticoagulants: Secondary | ICD-10-CM | POA: Diagnosis not present

## 2022-07-01 DIAGNOSIS — R079 Chest pain, unspecified: Secondary | ICD-10-CM | POA: Diagnosis not present

## 2022-07-01 DIAGNOSIS — K760 Fatty (change of) liver, not elsewhere classified: Secondary | ICD-10-CM | POA: Diagnosis present

## 2022-07-01 LAB — CBC
HCT: 39.1 % (ref 36.0–46.0)
Hemoglobin: 12.6 g/dL (ref 12.0–15.0)
MCH: 32.1 pg (ref 26.0–34.0)
MCHC: 32.2 g/dL (ref 30.0–36.0)
MCV: 99.5 fL (ref 80.0–100.0)
Platelets: 175 10*3/uL (ref 150–400)
RBC: 3.93 MIL/uL (ref 3.87–5.11)
RDW: 12.5 % (ref 11.5–15.5)
WBC: 7.6 10*3/uL (ref 4.0–10.5)
nRBC: 0 % (ref 0.0–0.2)

## 2022-07-01 LAB — ECHOCARDIOGRAM COMPLETE
Area-P 1/2: 2.66 cm2
Height: 62 in
S' Lateral: 2.2 cm
Weight: 2656 oz

## 2022-07-01 LAB — HEPARIN LEVEL (UNFRACTIONATED)
Heparin Unfractionated: 0.96 IU/mL — ABNORMAL HIGH (ref 0.30–0.70)
Heparin Unfractionated: 0.56 IU/mL (ref 0.30–0.70)
Heparin Unfractionated: 0.65 IU/mL (ref 0.30–0.70)

## 2022-07-01 LAB — TROPONIN I (HIGH SENSITIVITY): Troponin I (High Sensitivity): 12 ng/L (ref <18)

## 2022-07-01 LAB — HEMOGLOBIN A1C
Hgb A1c MFr Bld: 8 % — ABNORMAL HIGH (ref 4.8–5.6)
Mean Plasma Glucose: 183 mg/dL

## 2022-07-01 LAB — GLUCOSE, CAPILLARY
Glucose-Capillary: 118 mg/dL — ABNORMAL HIGH (ref 70–99)
Glucose-Capillary: 132 mg/dL — ABNORMAL HIGH (ref 70–99)
Glucose-Capillary: 121 mg/dL — ABNORMAL HIGH (ref 70–99)
Glucose-Capillary: 146 mg/dL — ABNORMAL HIGH (ref 70–99)

## 2022-07-01 LAB — APTT
aPTT: 158 seconds — ABNORMAL HIGH (ref 24–36)
aPTT: 97 seconds — ABNORMAL HIGH (ref 24–36)

## 2022-07-01 MED ORDER — ISOSORBIDE MONONITRATE ER 30 MG PO TB24
15.0000 mg | ORAL_TABLET | Freq: Every day | ORAL | Status: DC
  Filled 2022-07-01: qty 1

## 2022-07-01 MED ORDER — IOHEXOL 9 MG/ML PO SOLN
500.0000 mL | ORAL | Status: AC
  Administered 2022-07-01 (×2): 500 mL via ORAL

## 2022-07-01 MED ORDER — ISOSORBIDE MONONITRATE ER 30 MG PO TB24
30.0000 mg | ORAL_TABLET | Freq: Every day | ORAL | Status: DC
  Filled 2022-07-01: qty 1

## 2022-07-01 MED ORDER — METOPROLOL TARTRATE 50 MG PO TABS
50.0000 mg | ORAL_TABLET | Freq: Two times a day (BID) | ORAL | Status: DC
  Administered 2022-07-01: 50 mg via ORAL
  Filled 2022-07-01: qty 1

## 2022-07-01 MED ORDER — SODIUM CHLORIDE 0.9 % IV BOLUS
500.0000 mL | Freq: Once | INTRAVENOUS | Status: AC
  Administered 2022-07-01: 500 mL via INTRAVENOUS

## 2022-07-01 MED ORDER — ALUM & MAG HYDROXIDE-SIMETH 200-200-20 MG/5ML PO SUSP
30.0000 mL | Freq: Once | ORAL | Status: AC
  Administered 2022-07-01: 30 mL via ORAL
  Filled 2022-07-01: qty 30

## 2022-07-01 MED ORDER — ACETAMINOPHEN 325 MG PO TABS
650.0000 mg | ORAL_TABLET | ORAL | Status: DC | PRN
  Administered 2022-07-01 – 2022-07-02 (×2): 650 mg via ORAL
  Filled 2022-07-01 (×3): qty 2

## 2022-07-01 MED ORDER — RANOLAZINE ER 500 MG PO TB12
500.0000 mg | ORAL_TABLET | Freq: Two times a day (BID) | ORAL | Status: DC
  Administered 2022-07-01 (×2): 500 mg via ORAL
  Filled 2022-07-01 (×3): qty 1

## 2022-07-01 MED ORDER — MIDODRINE HCL 5 MG PO TABS
5.0000 mg | ORAL_TABLET | Freq: Once | ORAL | Status: AC
  Administered 2022-07-01: 5 mg via ORAL
  Filled 2022-07-01: qty 1

## 2022-07-01 MED ORDER — IOHEXOL 300 MG/ML  SOLN
100.0000 mL | Freq: Once | INTRAMUSCULAR | Status: AC | PRN
  Administered 2022-07-01: 100 mL via INTRAVENOUS

## 2022-07-01 NOTE — TOC Initial Note (Signed)
Transition of Care [facility]) - Initial/Assessment Note    Patient Details  Name: Candace [name] MRN: [id number] Date of Birth: 01-18-1951  Transition of Care [facility]) CM/SW Contact:    Laurena Slimmer, RN Phone Number: 07/01/2022, 12:50 PM  Clinical Narrative:                  Transition of Care [facility]) Screening Note   Patient Details  Name: Candace [name] Date of Birth: [birth year]   Transition of Care [facility]) CM/SW Contact:    Laurena Slimmer, RN Phone Number: 07/01/2022, 12:50 PM    Transition of Care Department [facility]) has reviewed patient and no TOC needs have been identified at this time. We will continue to monitor patient advancement through interdisciplinary progression rounds. If new patient transition needs arise, please place a TOC consult.          Patient Goals and CMS Choice            Expected Discharge Plan and Services                                              Prior Living Arrangements/Services                       Activities of Daily Living Home Assistive Devices/Equipment: None ADL Screening (condition at time of admission) Patient's cognitive ability adequate to safely complete daily activities?: Yes Is the patient deaf or have difficulty hearing?: No Does the patient have difficulty seeing, even when wearing glasses/contacts?: No Does the patient have difficulty concentrating, remembering, or making decisions?: No Patient able to express need for assistance with ADLs?: Yes Does the patient have difficulty dressing or bathing?: No Independently performs ADLs?: Yes (appropriate for developmental age) Does the patient have difficulty walking or climbing stairs?: No Weakness of Legs: None Weakness of Arms/Hands: None  Permission Sought/Granted                  Emotional Assessment              Admission diagnosis:  Unstable angina [I20.0] Chest pain [R07.9] Patient Active Problem List   Diagnosis Date Noted    DNR (do not resuscitate) 06/30/2022   Suprapubic abdominal pain 06/30/2022   Blurred vision, right eye 01/08/2022   History of vertigo 08/16/2021   Obesity (BMI 30-39.9) 08/14/2021   Atrial fibrillation with RVR 08/13/2021   Hypokalemia 08/12/2021   COVID-19 08/11/2021   Orthostatic hypotension 08/11/2021   Left hip pain 06/30/2021   Paroxysmal atrial fibrillation 07/10/2020   History of coronary artery bypass graft 07/10/2020   Unsteady gait    Acute bronchitis 03/07/2019   Esophageal dysphagia    Stomach irritation    Gastric polyp    Esophageal lump    Hx of colonic polyp    Polyp of colon    Diverticulosis of large intestine without diverticulitis    PSVT (paroxysmal supraventricular tachycardia) 10/03/2018   Abnormal ECG 07/05/2018   Tachycardia 03/27/2018   Pain in limb 02/28/2018   Chronic diastolic CHF (congestive heart failure) 11/03/2017   TIA (transient ischemic attack) 11/03/2017   COPD suggested by initial evaluation 08/23/2017   Incidental pulmonary nodule 08/01/2017   Non-ST elevation myocardial infarction (NSTEMI), subendocardial infarction, subsequent episode of care 06/01/2017   B12 deficiency 12/01/2016   Vitamin D deficiency  11/04/2016   Depression with anxiety 09/30/2016   AF (paroxysmal atrial fibrillation) 09/30/2016   Generalized weakness 09/30/2016   Tremors of nervous system 09/30/2016   Mild obstructive sleep apnea 09/30/2016   Headache 08/18/2016   Unstable angina 08/03/2016   Type 2 diabetes mellitus with diabetic polyneuropathy, with long-term current use of insulin 08/03/2016   Chronic daily headache 05/05/2016   Asthma 11/11/2015   S/P CABG x 4 11/10/2015   Presence of aortocoronary bypass graft 11/10/2015   Coronary artery disease 11/07/2015   Carotid artery narrowing 02/08/2014   Chest pain 02/08/2014   Mixed hyperlipidemia 02/08/2014   Bilateral carotid artery stenosis 02/08/2014   Atypical chest pain 02/07/2014   Essential  hypertension 02/07/2014   Awareness of heartbeats 02/07/2014   D (diarrhea) 10/05/2013   Gastroesophageal reflux disease 10/05/2013   PCP:  Eulas Post, MD Pharmacy:   [facility] - [place], [place] - [address] [name] [address] Phone: [phone] Fax: [phone]  [id number] - [place] [place] [address] [place] [address] Phone: [phone] Fax: [phone]  [facility] #[id number] - [place], [place] - [address] [place] [address] Phone: [phone] Fax: [phone]     Social Determinants of Health (SDOH) Social History: [place]: No Food Insecurity (06/30/2022)  Housing: Low Risk  (06/30/2022)  Transportation Needs: No Transportation Needs (06/30/2022)  Utilities: Not At Risk (06/30/2022)  Alcohol Screen: Low Risk  (08/10/2021)  Depression (PHQ2-9): Low Risk  (03/26/2022)  Financial Resource Strain: Low Risk  (03/26/2022)  Physical Activity: Insufficiently Active (08/10/2021)  Social Connections: Moderately Integrated (03/26/2022)  Stress: No Stress Concern Present (03/26/2022)  Tobacco Use: Medium Risk (06/30/2022)   SDOH Interventions:     Readmission Risk Interventions     No data to display

## 2022-07-01 NOTE — Progress Notes (Signed)
[place] for initiation and monitoring of heparin infusion Indication: atrial fibrillation  Allergies  Allergen Reactions   Tramadol Other (See Comments)    Causes patient to be off balance and Mental Changes   Lisinopril Cough   Penicillins Swelling, Rash and Other (See Comments)    Did it involve swelling of the face/tongue/throat, SOB, or low BP? Yes Did it involve sudden or severe rash/hives, skin peeling, or any reaction on the inside of your mouth or nose? Yes Did you need to seek medical attention at a hospital or doctor's office? Yes When did it last happen?      15 years If all above answers are "NO", may proceed with cephalosporin use.     Patient Measurements: Height: 5\' 2"  (157.5 cm) Weight: 75.3 kg (166 lb) IBW/kg (Calculated) : 50.1 Heparin Dosing Weight: 66.4 kg  Vital Signs: Temp: 97.6 F (36.4 C) (04/04 0350) BP: 92/63 (04/04 0350) Pulse Rate: 90 (04/04 0350)  Labs: Recent Labs    06/30/22 1100 06/30/22 1325 06/30/22 1536 07/01/22 0441  HGB 14.5  --   --  12.6  HCT 44.6  --   --  39.1  PLT 181  --   --  175  APTT  --   --  32 158*  HEPARINUNFRC  --   --  0.35 0.96*  CREATININE 0.76  --   --   --   TROPONINIHS 10 8  --   --      Estimated Creatinine Clearance: 61.3 mL/min (by C-G formula based on SCr of 0.76 mg/dL).   Medical History: Past Medical History:  Diagnosis Date   Allergy    Anemia    Anxiety    Arrhythmia    Arthritis    Atrial fibrillation    CHF (congestive heart failure)    COPD (chronic obstructive pulmonary disease)    Coronary artery disease    Depression    Diabetes mellitus without complication    Dyspnea    doe   Dysrhythmia    GERD (gastroesophageal reflux disease)    Headache    History of hiatal hernia    Hyperlipidemia    Hypertension    Myocardial infarction    2016, 04/2017   Myocardial infarction with cardiac rehabilitation    MI 2016/ CABG 8/17    FINISHED  CARDIAC REHAB 3 WEEKS AGO   Panic attack    Pneumonia    Reflux    Stroke 2015   showed up on MRI; no weakness noted   TIA (transient ischemic attack)    Voice tremor     Medications:  Scheduled:   aspirin EC  81 mg Oral Daily   ezetimibe  10 mg Oral Daily   insulin aspart  0-15 Units Subcutaneous TID WC   insulin aspart  0-5 Units Subcutaneous QHS   insulin glargine-yfgn  12 Units Subcutaneous QHS   isosorbide mononitrate  90 mg Oral Daily   metoprolol tartrate  100 mg Oral BID   pantoprazole  20 mg Oral BID   rosuvastatin  40 mg Oral QHS   sacubitril-valsartan  1 tablet Oral Daily   sertraline  100 mg Oral Daily    Assessment: 72 year old female with history of CAD status post CABG, LIMA to LAD, SVG to PDA and OM, DES to SVG in 2021, and balloon angioplasty at [facility] 2023, anxiety, depression, hyperlipidemia, insulin-dependent diabetes mellitus, GERD, atrial fibrillation, on Eliquis. Last dose apixaban this  morning at 0730   Goal of Therapy:  Heparin level 0.3-0.7 units/ml aPTT 66 - 102 seconds Monitor platelets by anticoagulation protocol: Yes   04/04 0441 aPTT 158, HL 0.96, supratherapeutic  Plan:  ---Decrease heparin infusion to 850 units/hr. ---Recheck HL in 8 hours after rate change ---Continue to monitor H&H and platelets daily  Renda Rolls, PharmD, [facility] 07/01/2022 6:52 AM

## 2022-07-01 NOTE — Progress Notes (Addendum)
[place] NOTE       Patient ID: MAYVA VOCK MRN: [id number] DOB/AGE: 09-02-1950 72 y.o.  Admit date: 06/30/2022 Referring Physician Dr. Rupert Stacks Primary Physician Dr. Miguel Aschoff Primary Cardiologist Dr. Clayborn Bigness Reason for Consultation unstable angina  HPI: Candace [name] is a 72yoF with a PMH of severe 3vCAD s/p CABG x 4 (atretic LIMA-LAD, occluded SVG to OM, patent SVG to RCA, with -70-80% mid LAD, 60-70% diffuse LCx disease (small vessel) and 90% distal RCA by [facility] 07/11/2020), HFpEF (EF 55-60%, G1 DD 07/16/2021), paroxysmal atrial fibrillation (Eliquis), hx TIA, DM2, essential tremor who presented to [facility] ED 06/30/2022 with chest pain that started while she was walking in the grocery store similar to her prior anginal pain.  Cardiology is consulted for further assistance.  Interval History:  -Overnight events noted, required multiple doses of Dilaudid and morphine overnight for pain.  I also restarted 90 mg of isosorbide and gave Nitropaste yesterday afternoon. -Seen and examined this morning with her daughter [name] at bedside, reported 8/10 lower sternal/epigastric discomfort earlier per nursing, at my time of evaluation she reports her pain a 6/10. -Repeat troponin at 11 AM after discomfort all night was still negative at 12. -UA with moderate leukocytes -Repeat echo with preserved EF 65-70% without regional wall motion abnormalities, essentially unchanged from prior study 06/2021  Review of systems complete and found to be negative unless listed above     Past Medical History:  Diagnosis Date   Allergy    Anemia    Anxiety    Arrhythmia    Arthritis    Atrial fibrillation    CHF (congestive heart failure)    COPD (chronic obstructive pulmonary disease)    Coronary artery disease    Depression    Diabetes mellitus without complication    Dyspnea    doe   Dysrhythmia    GERD (gastroesophageal reflux disease)    Headache    History of  hiatal hernia    Hyperlipidemia    Hypertension    Myocardial infarction    2016, 04/2017   Myocardial infarction with cardiac rehabilitation    MI 2016/ CABG 8/17    FINISHED CARDIAC REHAB 3 WEEKS AGO   Panic attack    Pneumonia    Reflux    Stroke 2015   showed up on MRI; no weakness noted   TIA (transient ischemic attack)    Voice tremor     Past Surgical History:  Procedure Laterality Date   ABDOMINAL HYSTERECTOMY     APPENDECTOMY  1975   ARTERY BIOPSY Right 04/26/2016   Procedure: BIOPSY TEMPORAL ARTERY;  Surgeon: Margaretha Sheffield, MD;  Location: [facility] ORS;  Service: ENT;  Laterality: Right;   CARDIAC CATHETERIZATION N/A 11/06/2015   Procedure: Left Heart Cath and Coronary Angiography;  Surgeon: Corey Skains, MD;  Location: [place] CV LAB;  Service: Cardiovascular;  Laterality: N/A;   CESAREAN SECTION     COLONOSCOPY  2015   COLONOSCOPY WITH PROPOFOL N/A 11/03/2018   Procedure: COLONOSCOPY WITH PROPOFOL;  Surgeon: Virgel Manifold, MD;  Location: [facility] ENDOSCOPY;  Service: Endoscopy;  Laterality: N/A;   CORONARY ANGIOPLASTY  04/2017   [place]   CORONARY ARTERY BYPASS GRAFT N/A 11/10/2015   Procedure: CORONARY ARTERY BYPASS GRAFTING (CABG), ON PUMP, TIMES FOUR, USING LEFT INTERNAL MAMMARY ARTERY, BILATERAL GREATER SAPHENOUS VEINS HARVESTED ENDOSCOPICALLY;  Surgeon: [name], MD;  Location: [place];  Service: Open Heart Surgery;  Laterality:  N/A;  LIMA-LAD; SEQ SVG-OM1-OM2; SVG-PL   CORONARY STENT INTERVENTION N/A 08/05/2016   Procedure: Coronary Stent Intervention;  Surgeon: Isaias Cowman, MD;  Location: [place] CV LAB;  Service: Cardiovascular;  Laterality: N/A;   ESOPHAGOGASTRODUODENOSCOPY (EGD) WITH PROPOFOL N/A 11/03/2018   Procedure: ESOPHAGOGASTRODUODENOSCOPY (EGD) WITH PROPOFOL;  Surgeon: Virgel Manifold, MD;  Location: [facility] ENDOSCOPY;  Service: Endoscopy;  Laterality: N/A;   HYSTERECTOMY ABDOMINAL WITH SALPINGO-OOPHORECTOMY  Bilateral 08/15/2017   Procedure: HYSTERECTOMY ABDOMINAL WITH BILATERAL SALPINGO-OOPHORECTOMY;  Surgeon: Rubie Maid, MD;  Location: [facility] ORS;  Service: Gynecology;  Laterality: Bilateral;   LEFT HEART CATH AND CORONARY ANGIOGRAPHY N/A 08/05/2016   Procedure: Left Heart Cath and Coronary Angiography;  Surgeon: Isaias Cowman, MD;  Location: [place] [facility];  Service: Cardiovascular;  Laterality: N/A;   LEFT HEART CATH AND CORS/GRAFTS ANGIOGRAPHY N/A 09/19/2018   Procedure: LEFT HEART CATH AND CORS/GRAFTS ANGIOGRAPHY;  Surgeon: Corey Skains, MD;  Location: [place] [facility];  Service: Cardiovascular;  Laterality: N/A;   LEFT HEART CATH AND CORS/GRAFTS ANGIOGRAPHY N/A 08/30/2019   Procedure: LEFT HEART CATH AND CORS/GRAFTS ANGIOGRAPHY;  Surgeon: Teodoro Spray, MD;  Location: [place] [facility];  Service: Cardiovascular;  Laterality: N/A;   OOPHORECTOMY     TEE WITHOUT CARDIOVERSION N/A 11/10/2015   Procedure: TRANSESOPHAGEAL ECHOCARDIOGRAM (TEE);  Surgeon: [name], MD;  Location: [place];  Service: Open Heart Surgery;  Laterality: N/A;   TUBAL LIGATION      Medications Prior to Admission  Medication Sig Dispense Refill Last Dose   acetaminophen (TYLENOL) 325 MG tablet Take 650 mg by mouth every 6 (six) hours as needed for moderate pain or headache.    prn at unk   albuterol (VENTOLIN HFA) 108 (90 Base) MCG/ACT inhaler Inhale 2 puffs into the lungs every 6 (six) hours as needed for wheezing or shortness of breath. 8 g 2 prn at unk   Dulaglutide (TRULICITY) 1.5 0000000 SOPN Inject 1.5 mg into the skin once a week. 6 mL 1 06/23/2022   ELIQUIS 5 MG TABS tablet Take 5 mg by mouth 2 (two) times daily.   06/30/2022 at 0730   empagliflozin (JARDIANCE) 25 MG TABS tablet TAKE ONE TABLET BY MOUTH BEFORE BREAKFAST 90 tablet 1 06/30/2022   ezetimibe (ZETIA) 10 MG tablet Take 1 tablet (10 mg total) by mouth daily. 90 tablet 1 06/30/2022   insulin glargine (LANTUS SOLOSTAR) 100 UNIT/ML  Solostar Pen Inject 12 Units into the skin at bedtime. (Patient taking differently: Inject 12 Units into the skin at bedtime.) 18 mL 3 06/29/2022 at 2030   loperamide (IMODIUM) 2 MG capsule Take 2 mg by mouth daily as needed for diarrhea or loose stools.   prn at unk   metoprolol tartrate (LOPRESSOR) 100 MG tablet Take 100 mg by mouth 2 (two) times daily.   06/30/2022   nitroGLYCERIN (NITROSTAT) 0.4 MG SL tablet Place 1 tablet (0.4 mg total) under the tongue every 5 (five) minutes as needed for chest pain. 30 tablet 0 prm at unk   nystatin (NYSTATIN) powder Apply 1 application topically daily. (Patient taking differently: Apply 1 application  topically as needed.) 60 g 5 prn at unk   ondansetron (ZOFRAN-ODT) 8 MG disintegrating tablet TAKE ONE TABLET BY MOUTH EVERY 8 HOURS AS NEEDED FOR NAUSEA AND VOMITING 30 tablet 1 -prn at unk   pantoprazole (PROTONIX) 20 MG tablet Take 1 tablet (20 mg total) by mouth 2 (two) times daily. 60 tablet 11 06/30/2022  rosuvastatin (CRESTOR) 40 MG tablet TAKE ONE TABLET BY MOUTH ONCE DAILY 90 tablet 0 06/30/2022   sacubitril-valsartan (ENTRESTO) 24-26 MG Take 1 tablet by mouth daily. 60 tablet 3 06/30/2022   sertraline (ZOLOFT) 100 MG tablet Take 1 tablet (100 mg total) by mouth daily. 90 tablet 1 06/30/2022   Vitamin D, Ergocalciferol, (DRISDOL) 1.25 MG (50000 UNIT) CAPS capsule Take 50,000 Units by mouth once a week.   Past Month   ALPRAZolam (XANAX) 0.25 MG tablet Take 1 tablet (0.25 mg total) by mouth every 8 (eight) hours as needed for anxiety. (Patient not taking: Reported on 06/30/2022) 90 tablet 5 Not Taking   aspirin EC 81 MG tablet Take 81 mg by mouth daily. Swallow whole.      Blood Glucose Monitoring Suppl (ONETOUCH VERIO) w/Device KIT Use daily to check blood sugar 1 kit 0    clopidogrel (PLAVIX) 75 MG tablet Take 1 tablet (75 mg total) by mouth daily. (Patient not taking: Reported on 06/30/2022) 90 tablet 1 Not Taking   COMFORT EZ PEN NEEDLES 32G X 4 MM MISC Use to inject  insulin daily 100 each 1    glucose blood test strip Use to check blood sugars daily as instructed 100 each 12    isosorbide mononitrate (IMDUR) 60 MG 24 hr tablet Take 1 tablet (60 mg total) by mouth daily. (Patient not taking: Reported on 06/30/2022) 90 tablet 3 Not Taking   Lancets (ONETOUCH DELICA PLUS [id number]) MISC USE UP TO 4 TIMES DAILY AS DIRECTED 100 each 0     Social History   Socioeconomic History   Marital status: Divorced    Spouse name: Not on file   Number of children: 3   Years of education: Not on file   Highest education level: Some college, no degree  Occupational History   Occupation: retired  Tobacco Use   Smoking status: Former    Packs/day: .5    Types: Cigarettes    Quit date: 10/07/2001    Years since quitting: 20.7   Smokeless tobacco: Never  Vaping Use   Vaping Use: Never used  Substance and Sexual Activity   Alcohol use: No    Alcohol/week: 0.0 standard drinks of alcohol   Drug use: No   Sexual activity: Not Currently  Other Topics Concern   Not on file  Social History Narrative   Lives at home alone   Social Determinants of Health   Financial Resource Strain: Low Risk  (03/26/2022)   Overall Financial Resource Strain (CARDIA)    Difficulty of Paying Living Expenses: Not hard at all  Food Insecurity: No Food Insecurity (06/30/2022)   Hunger Vital Sign    Worried About Running Out of Food in the Last Year: Never true    [place] in the Last Year: Never true  Transportation Needs: No Transportation Needs (06/30/2022)   PRAPARE - Hydrologist (Medical): No    Lack of Transportation (Non-Medical): No  Physical Activity: Insufficiently Active (08/10/2021)   Exercise Vital Sign    Days of Exercise per Week: 2 days    Minutes of Exercise per Session: 20 min  Stress: No Stress Concern Present (03/26/2022)   [place]    Feeling of Stress : Not at  all  Social Connections: Moderately Integrated (03/26/2022)   Social Connection and Isolation Panel [NHANES]    Frequency of Communication with Friends and Family: More than three times  a week    Frequency of Social Gatherings with Friends and Family: Twice a week    Attends Religious Services: More than 4 times per year    Active Member of [employer] or Organizations: Yes    Attends Archivist Meetings: Never    Marital Status: Divorced  Human resources officer Violence: Not At Risk (06/30/2022)   Humiliation, Afraid, Rape, and Kick questionnaire    Fear of Current or Ex-Partner: No    Emotionally Abused: No    Physically Abused: No    Sexually Abused: No    Family History  Problem Relation Age of Onset   Cancer Father    Hypertension Father    Heart disease Father    Asthma Father    Cancer Mother    Hypertension Mother    Pancreatic cancer Mother 3   Cancer Sister    Breast cancer Sister 69   Breast cancer Sister 64   Lung cancer Brother    Pancreatic cancer Sister 55   Cancer Sister       Intake/Output Summary (Last 24 hours) at 07/01/2022 0852 Last data filed at 06/30/2022 1927 Gross per 24 hour  Intake 0 ml  Output --  Net 0 ml    Vitals:   06/30/22 2028 06/30/22 2326 07/01/22 0350 07/01/22 0844  BP: (!) 104/34 92/60 92/63  99/61  Pulse: 69 77 90 76  Resp: 18 18 18 16   Temp: (!) 97.4 F (36.3 C) (!) 97.5 F (36.4 C) 97.6 F (36.4 C) 97.9 F (36.6 C)  TempSrc:      SpO2: (!) 88% 96% (!) 83% 94%  Weight:      Height:        PHYSICAL EXAM General: Pleasant elderly Caucasian female, well nourished, in no acute distress.  Sitting upright shared room in PCU daughter [name]) at bedside HEENT:  Normocephalic and atraumatic. Neck:  No JVD.  Lungs: Normal respiratory effort on room air.  Decreased breath sounds without appreciable crackles or wheezes. Heart: HRRR . Normal S1 and S2 without gallops or murmurs.  Abdomen: Soft, significant tenderness to palpation with  guarding to palpation of the epigastrium, tenderness to palpation in the suprapubic region without rebound or guarding. Msk: Normal strength and tone for age. Extremities: Warm and well perfused. No clubbing, cyanosis.  No peripheral edema.  Neuro: Alert and oriented X 3.  Generalized tremor to head and neck, slow speech cadence which is baseline per patient and daughter Psych:  Answers questions appropriately.   Labs: Basic Metabolic Panel: Recent Labs    06/30/22 1100  NA 140  K 4.2  CL 107  CO2 25  GLUCOSE 133*  BUN 13  CREATININE 0.76  CALCIUM 9.4    Liver Function Tests: No results for input(s): "AST", "ALT", "ALKPHOS", "BILITOT", "PROT", "ALBUMIN" in the last 72 hours. No results for input(s): "LIPASE", "AMYLASE" in the last 72 hours. CBC: Recent Labs    06/30/22 1100 07/01/22 0441  WBC 6.7 7.6  HGB 14.5 12.6  HCT 44.6 39.1  MCV 97.8 99.5  PLT 181 175    Cardiac Enzymes: Recent Labs    06/30/22 1100 06/30/22 1325  TROPONINIHS 10 8    BNP: No results for input(s): "BNP" in the last 72 hours. D-Dimer: No results for input(s): "DDIMER" in the last 72 hours. Hemoglobin A1C: No results for input(s): "HGBA1C" in the last 72 hours. Fasting Lipid Panel: No results for input(s): "CHOL", "HDL", "LDLCALC", "TRIG", "CHOLHDL", "LDLDIRECT" in the last 72  hours. Thyroid Function Tests: No results for input(s): "TSH", "T4TOTAL", "T3FREE", "THYROIDAB" in the last 72 hours.  Invalid input(s): "FREET3" Anemia Panel: No results for input(s): "VITAMINB12", "FOLATE", "FERRITIN", "TIBC", "IRON", "RETICCTPCT" in the last 72 hours.   Radiology: DG Chest 2 View  Result Date: 06/30/2022 CLINICAL DATA:  Dizziness, LEFT side chest pain radiating down LEFT arm. EXAM: CHEST - 2 VIEW COMPARISON:  03/25/2022 FINDINGS: Normal heart size post CABG Mediastinal contours and pulmonary vascularity normal. Atherosclerotic calcification aorta. Lungs clear. No pulmonary infiltrate, pleural  effusion, or pneumothorax. Osseous demineralization with BILATERAL glenohumeral degenerative changes. IMPRESSION: Post CABG. No acute abnormalities. Aortic Atherosclerosis (ICD10-I70.0). Electronically Signed   By: Lavonia Dana M.D.   On: 06/30/2022 11:33    LHC 07/11/2020 at [facility] Findings:  1. Severe 3v CAD including 70-80% mid LAD, 60-70% diffuse LCx disease  (small vessel) and 90% distal RCA.   2. Patent SVG to RCA, Occluded SVG to OM (likely culprit), Atretic LIMA to  LAD  3. Normal left ventricular filling pressures (LVEDP = 15  mmHg)   Recommendations:  1. Aggressive secondary prevention.  2. Follow up with primary cardiologist.  3. Can consider PCI of LAD in the future if symptoms are not controlled  with medical management   ECHO 07/16/2021 1. Left ventricular ejection fraction, by estimation, is 55 to 60%. The  left ventricle has normal function. The left ventricle has no regional  wall motion abnormalities. Left ventricular diastolic parameters are  consistent with Grade I diastolic  dysfunction (impaired relaxation).   2. Right ventricular systolic function is normal. The right ventricular  size is normal.   3. The mitral valve is normal in structure. Trivial mitral valve  regurgitation.   4. The aortic valve is normal in structure. Aortic valve regurgitation is  not visualized.   TELEMETRY reviewed by me (LT) 07/01/2022 : Sinus rhythm rate 60s-70s  EKG reviewed by me: Sinus rhythm rate 74 bpm with baseline abnormalities with inferior and lateral ST depressions a chronic nonspecific IVCD, essentially unchanged from 03/25/2022.  Data reviewed by me (LT) 07/01/2022: Last outpatient cardiology note, last cardiac cath report from [facility], ED note, admission H&P, nursing notes last 24h vitals tele labs imaging I/O   Principal Problem:   Chest pain Active Problems:   Essential hypertension   Gastroesophageal reflux disease   Mixed hyperlipidemia   Coronary artery disease   Type 2  diabetes mellitus with diabetic polyneuropathy, with long-term current use of insulin   Depression with anxiety   AF (paroxysmal atrial fibrillation)   Esophageal dysphagia   History of coronary artery bypass graft   Obesity (BMI 30-39.9)   Suprapubic abdominal pain    ASSESSMENT AND PLAN:  Candace [name] is a 38yoF with a PMH of severe 3vCAD s/p CABG x 4 2017 (atretic LIMA-LAD, occluded SVG to OM, patent SVG to RCA, with -70-80% mid LAD, 60-70% diffuse LCx disease (small vessel) and 90% distal RCA by [facility] 07/11/2020), HFpEF (EF 55-60%, G1 DD 07/16/2021), paroxysmal atrial fibrillation (Eliquis), hx TIA, DM2, essential tremor who presented to [facility] ED 06/30/2022 with chest pain that started while she was walking in the grocery store similar to her prior anginal pain.  Cardiology is consulted for further assistance.  # Unstable angina # Severe 3v CAD s/p CABG x 4 (2017) Presents with 8/10 central stabbing chest pain radiating down her left arm with associated nausea and diaphoresis consistent with her prior anginal pain, improvement from an 8/10 to 6/10 after IV  Dilaudid and nitroglycerin ointment.  Notably, she was doing well and active without recurrent angina for at least a year, and was recently taken off of isosorbide last week (which she had been taking for many years) to decrease pill burden.  Fortunately, troponins are negative on initial 2 checks at 10, 9.  Baseline EKG is abnormal with a chronic nonspecific IVCD/incomplete LBBB and inferolateral ST depressions that are essentially unchanged from prior EKG 4 months ago.  I offered the patient a Lexiscan Myoview for noninvasive ischemic evaluation, but she politely declined as she experienced severe GI upset/cramping last Myoview 06/2021.  Note below the patient with significant epigastric pain with guarding to palpation on exam today, her chest pain that she localizes to the lower sternum are unrelieved with multiple doses of IV morphine and  Dilaudid and addition of Nitropaste, and isosorbide.  Strongly suspected on cardiac etiology, repeat troponin today at 11 AM while the patient was having ongoing pain was still negative at 12. -Continue as needed morphine, Nitropaste -Continue Imdur but decrease dose to 15 mg with borderline blood pressures in the setting of narcotics.   -Start ranexa 500 mg twice daily -Continue aspirin 81 mg daily -Hold Eliquis (last dose morning of 4/3) and start heparin for stroke prophylaxis while inpatient -Continue Zetia, Crestor 40 mg daily -ECHO complete resulted with preserved EF 65-70% without regional wall motion abnormalities, essentially unchanged from prior study 06/2021 -Strongly recommend further GI evaluation / imaging for alternative etiologies of her discomfort.  -No plan for LHC at this point  # Epigastric/RUQ pain Reproducible to palpation, with guarding today, worse on exam today compared to yesterday. -Continue Protonix, add Maalox x 1 today recommend further workup for other possible GI etiologies of the patient's discomfort per primary team.  # HFpEF (EF 55-60%) Euvolemic on exam, chest x-ray clear. -Continue GDMT with metoprolol tartrate 100 mg twice daily, Entresto 24-26 mg twice daily, Jardiance 10 mg daily.  Continue Trulicity SQ injection at regular interval  # Paroxysmal AF In sinus rhythm on telemetry, metoprolol for rate control on Eliquis outpatient with heparin while hospitalized in the event that she needs a procedure  This patient's plan of care was discussed and created with Dr. Clayborn Bigness and he is in agreement.  Signed: Tristan Schroeder , PA-C 07/01/2022, 8:52 AM [facility]

## 2022-07-01 NOTE — Progress Notes (Signed)
[place] for initiation and monitoring of heparin infusion Indication: atrial fibrillation  Allergies  Allergen Reactions   Tramadol Other (See Comments)    Causes patient to be off balance and Mental Changes   Lisinopril Cough   Penicillins Swelling, Rash and Other (See Comments)    Did it involve swelling of the face/tongue/throat, SOB, or low BP? Yes Did it involve sudden or severe rash/hives, skin peeling, or any reaction on the inside of your mouth or nose? Yes Did you need to seek medical attention at a hospital or doctor's office? Yes When did it last happen?      15 years If all above answers are "NO", may proceed with cephalosporin use.     Patient Measurements: Height: 5\' 2"  (157.5 cm) Weight: 75.3 kg (166 lb) IBW/kg (Calculated) : 50.1 Heparin Dosing Weight: 66.4 kg  Vital Signs: Temp: 97.9 F (36.6 C) (04/04 1155) BP: 85/37 (04/04 1155) Pulse Rate: 80 (04/04 1155)  Labs: Recent Labs    06/30/22 1100 06/30/22 1325 06/30/22 1536 07/01/22 0441 07/01/22 1059  HGB 14.5  --   --  12.6  --   HCT 44.6  --   --  39.1  --   PLT 181  --   --  175  --   APTT  --   --  32 158*  --   HEPARINUNFRC  --   --  0.35 0.96*  --   CREATININE 0.76  --   --   --   --   TROPONINIHS 10 8  --   --  12     Estimated Creatinine Clearance: 61.3 mL/min (by C-G formula based on SCr of 0.76 mg/dL).   Medical History: Past Medical History:  Diagnosis Date   Allergy    Anemia    Anxiety    Arrhythmia    Arthritis    Atrial fibrillation    CHF (congestive heart failure)    COPD (chronic obstructive pulmonary disease)    Coronary artery disease    Depression    Diabetes mellitus without complication    Dyspnea    doe   Dysrhythmia    GERD (gastroesophageal reflux disease)    Headache    History of hiatal hernia    Hyperlipidemia    Hypertension    Myocardial infarction    2016, 04/2017   Myocardial infarction with cardiac  rehabilitation    MI 2016/ CABG 8/17    FINISHED CARDIAC REHAB 3 WEEKS AGO   Panic attack    Pneumonia    Reflux    Stroke 2015   showed up on MRI; no weakness noted   TIA (transient ischemic attack)    Voice tremor     Medications:  Scheduled:   aspirin EC  81 mg Oral Daily   ezetimibe  10 mg Oral Daily   insulin aspart  0-15 Units Subcutaneous TID WC   insulin aspart  0-5 Units Subcutaneous QHS   insulin glargine-yfgn  12 Units Subcutaneous QHS   [START ON 07/02/2022] isosorbide mononitrate  15 mg Oral Daily   pantoprazole  20 mg Oral BID   ranolazine  500 mg Oral BID   rosuvastatin  40 mg Oral QHS   sertraline  100 mg Oral Daily    Assessment: 72 year old female with history of CAD status post CABG, LIMA to LAD, SVG to PDA and OM, DES to SVG in 2021, and balloon angioplasty at [facility] 2023,  anxiety, depression, hyperlipidemia, insulin-dependent diabetes mellitus, GERD, atrial fibrillation, on Eliquis. Last dose apixaban 06/30/22 at 0730   Goal of Therapy:  Heparin level 0.3-0.7 units/ml Monitor platelets by anticoagulation protocol: Yes  Plan: aPTT and anti-Xa level therapeutic and appear to be coordinated (we will move to checking anti-Xa levels only) ---continue heparin infusion at 850 units/hr. ---Recheck heparin level in 8 hours  ---Continue to monitor H&H and platelets daily  Vallery Sa, PharmD, BCPS 07/01/2022 3:10 PM

## 2022-07-01 NOTE — Progress Notes (Signed)
PROGRESS NOTE    Candace [name]   [id number] DOB: Jan 04, 1951  DOA: 06/30/2022 Date of Service: 07/01/22 PCP: Eulas Post, MD     Brief Narrative / Hospital Course:  [name] is a 72 year old female with history of CAD status post CABG, LIMA to LAD, SVG to PDA and OM, DES to SVG in 2021, and balloon angioplasty at [facility] 2023, anxiety, depression, hyperlipidemia, insulin-dependent diabetes mellitus, GERD, atrial fibrillation, on Eliquis, mild expressive aphasia post CVA about 3 years ago, who presents to emergency department for chief concerns of chest pain, shortness of breath and diaphoresis while in her car trying to go to the grocery store. 04/03: High sensitive troponin was 10. EKG showed sinus rhythm with rate of 79, QTc 43, mild change in leads II with deeper ST depression when compared to her EKG. ED treatment: Ondansetron 4 mg IV one-time dose, sodium chloride 1 L bolus, morphine 2 mg IV. EMS gave ASA. Pt admitted to hospitalist service, cardiology consulted.  04/04: hypotensive intermittently without symptoms - reduced imdur, holding entresto and metoprolol. Persistent epigastric pain and sharp chest discomfort, ACS unlikely, plan CT abd/pelvis. UA mod leuks but no dysuria/frequency, await UCx. Repeat echo with preserved EF 65-70% without regional wall motion abnormalities, essentially unchanged from prior study 06/2021.    Consultants:  Cardiology  Procedures: None       ASSESSMENT & PLAN:   Principal Problem:   Chest pain Active Problems:   Essential hypertension   Type 2 diabetes mellitus with diabetic polyneuropathy, with long-term current use of insulin   Depression with anxiety   Gastroesophageal reflux disease   Mixed hyperlipidemia   Coronary artery disease   AF (paroxysmal atrial fibrillation)   Esophageal dysphagia   History of coronary artery bypass graft   Obesity (BMI 30-39.9)   Suprapubic abdominal pain  Chest  pain, Unstable Angina w/ underlying CAD  Etiology workup in progress, given patient's extensive history of CAD with multiple coronary arteries with stenosis of varying degrees, ACS cannot be excluded at this time Continue to monitor high-sensitivity troponin level Per cardiology, pt was offered the a Lexiscan Myoview for noninvasive ischemic evaluation, but she politely declined as she experienced severe GI upset/cramping last Myoview 06/2021. D/c nitropaste d/t hypotension and unlikely ACS Lowered dose imdur d/t hypotension Starting ranexa Continue aspirin 81 mg daily Hold Eliquis (last dose morning of 4/3) and start heparin for stroke prophylaxis while inpatient Continue Zetia, Crestor 40 mg daily Avoid opiates d/t hypotension If chest pain is refractory to medical management, consider LHC but no plan for that at this time   Coronary artery disease History of coronary artery bypass graft LIMA to LAD, SVG to PDA and OM in 2017 DES in 2021 and balloon angioplasty Status post aspirin 342 mg via EMS Resumed home aspirin 81 mg daily, rosuvastatin Holding metoprolol tartrate, Entresto d/t low BP Nitroglycerin as needed for chest pain ordered but d/c d/t low BP Prior Plavix 75 mg daily and Imdur 60 mg daily were not resumed on admission as these medications were discontinued by outpatient cardiology 1 week ago Cardiology following    Advance care planning On admission at bedside, patient reports that she would like 1 attempt at chest compressions, it was discussed that that would mean we would put her at full code, patient is in agreement with this   Suprapubic abdominal pain without urinary complaints  UA (+)Leuk but min WBC, no nitrite  UCx   Type 2  diabetes mellitus with diabetic polyneuropathy, with long-term current use of insulin Patient n.p.o. and decreased p.o. intake on day of admission therefore long-acting insulin was decreased to 6 units nightly one-time dose Long-acting  insulin 12 units subcutaneous nightly resumed for 07/01/2022 Insulin SSI with agents coverage ordered  AF (paroxysmal atrial fibrillation) Home Eliquis 5 mg p.o. twice daily held for now in case needs LHC Continue heparin  metoprolol tartrate 100 mg p.o. twice daily rate control held d/t low BP  Essential hypertension Home metoprolol to tartrate 100 mg p.o. twice daily held d/t low BP  Mixed hyperlipidemia Rosuvastatin 40 mg nightly ezetimibe 10 mg daily  Gastroesophageal reflux disease Home PPI twice daily resumed  Esophageal dysphagia Home PPI twice daily   Depression with anxiety home sertraline 100 mg daily    DVT prophylaxis: heparin Pertinent IV fluids/nutrition: no continuous fluids  Central lines / invasive devices: none   Code Status: FULL CODE   Current Admission Status: inpatient   TOC needs / Dispo plan: pending Barriers to discharge / significant pending items: clinical improvement in BP, eval abdominal/pelvic pain              Subjective / Brief ROS:  Patient reports no concerns other than epigastric/LUQ abd pain and suprapubic pain Denies CP/SOB.  Denies new weakness.  Tolerating diet, ate all her lunch.  Reports no concerns w/ urination/defecation.   Family Communication: daughter at bedside on rounds     Objective Findings:  Vitals:   06/30/22 2326 07/01/22 0350 07/01/22 0844 07/01/22 1155  BP: 92/60 92/63 99/61  (!) 85/37  Pulse: 77 90 76 80  Resp: 18 18 16 14   Temp: (!) 97.5 F (36.4 C) 97.6 F (36.4 C) 97.9 F (36.6 C) 97.9 F (36.6 C)  TempSrc:      SpO2: 96% (!) 83% 94% 93%  Weight:      Height:        Intake/Output Summary (Last 24 hours) at 07/01/2022 1427 Last data filed at 07/01/2022 1416 Gross per 24 hour  Intake 368.9 ml  Output --  Net 368.9 ml   Filed Weights   06/30/22 1044  Weight: 75.3 kg    Examination:  Physical Exam Constitutional:      General: She is not in acute distress.    Appearance: She is  well-developed.  Cardiovascular:     Rate and Rhythm: Normal rate and regular rhythm.     Heart sounds: Normal heart sounds.  Pulmonary:     Effort: Pulmonary effort is normal. No respiratory distress.     Breath sounds: Normal breath sounds.  Abdominal:     Palpations: Abdomen is soft. There is no mass.     Tenderness: There is abdominal tenderness (epigastric). There is no guarding.  Musculoskeletal:        General: Normal range of motion.     Right lower leg: No edema.     Left lower leg: No edema.  Skin:    General: Skin is warm and dry.  Neurological:     General: No focal deficit present.     Mental Status: She is alert and oriented to person, place, and time.  Psychiatric:        Mood and Affect: Mood normal.        Behavior: Behavior normal.          Scheduled Medications:   aspirin EC  81 mg Oral Daily   ezetimibe  10 mg Oral Daily   insulin aspart  0-15 Units Subcutaneous TID WC   insulin aspart  0-5 Units Subcutaneous QHS   insulin glargine-yfgn  12 Units Subcutaneous QHS   [START ON 07/02/2022] isosorbide mononitrate  15 mg Oral Daily   pantoprazole  20 mg Oral BID   ranolazine  500 mg Oral BID   rosuvastatin  40 mg Oral QHS   sertraline  100 mg Oral Daily    Continuous Infusions:  heparin 850 Units/hr (07/01/22 0741)   promethazine (PHENERGAN) injection (IM or IVPB)     sodium chloride      PRN Medications:  acetaminophen, albuterol, nitroGLYCERIN, nystatin, ondansetron (ZOFRAN) IV, promethazine (PHENERGAN) injection (IM or IVPB)  Antimicrobials from admission:  Anti-infectives (From admission, onward)    None           Data Reviewed:  I have personally reviewed the following...  CBC: Recent Labs  Lab 06/30/22 1100 07/01/22 0441  WBC 6.7 7.6  HGB 14.5 12.6  HCT 44.6 39.1  MCV 97.8 99.5  PLT 181 0000000   Basic Metabolic Panel: Recent Labs  Lab 06/30/22 1100  NA 140  K 4.2  CL 107  CO2 25  GLUCOSE 133*  BUN 13  CREATININE  0.76  CALCIUM 9.4   GFR: Estimated Creatinine Clearance: 61.3 mL/min (by C-G formula based on SCr of 0.76 mg/dL). Liver Function Tests: No results for input(s): "AST", "ALT", "ALKPHOS", "BILITOT", "PROT", "ALBUMIN" in the last 168 hours. No results for input(s): "LIPASE", "AMYLASE" in the last 168 hours. No results for input(s): "AMMONIA" in the last 168 hours. Coagulation Profile: No results for input(s): "INR", "PROTIME" in the last 168 hours. Cardiac Enzymes: No results for input(s): "CKTOTAL", "CKMB", "CKMBINDEX", "TROPONINI" in the last 168 hours. BNP (last 3 results) No results for input(s): "PROBNP" in the last 8760 hours. HbA1C: No results for input(s): "HGBA1C" in the last 72 hours. CBG: Recent Labs  Lab 06/30/22 1522 06/30/22 2054 07/01/22 0847 07/01/22 1156  GLUCAP 99 125* 118* 132*   Lipid Profile: No results for input(s): "CHOL", "HDL", "LDLCALC", "TRIG", "CHOLHDL", "LDLDIRECT" in the last 72 hours. Thyroid Function Tests: No results for input(s): "TSH", "T4TOTAL", "FREET4", "T3FREE", "THYROIDAB" in the last 72 hours. Anemia Panel: No results for input(s): "VITAMINB12", "FOLATE", "FERRITIN", "TIBC", "IRON", "RETICCTPCT" in the last 72 hours. Most Recent Urinalysis On File:     Component Value Date/Time   COLORURINE YELLOW (A) 06/30/2022 1334   APPEARANCEUR HAZY (A) 06/30/2022 1334   APPEARANCEUR Clear 12/24/2014 0905   LABSPEC 1.029 06/30/2022 1334   LABSPEC 1.030 09/14/2013 2123   PHURINE 5.0 06/30/2022 1334   GLUCOSEU >=500 (A) 06/30/2022 1334   GLUCOSEU Negative 09/14/2013 2123   HGBUR SMALL (A) 06/30/2022 1334   BILIRUBINUR NEGATIVE 06/30/2022 1334   BILIRUBINUR Negative 02/06/2020 1014   BILIRUBINUR Negative 12/24/2014 0905   BILIRUBINUR Negative 09/14/2013 2123   KETONESUR NEGATIVE 06/30/2022 1334   PROTEINUR NEGATIVE 06/30/2022 1334   UROBILINOGEN 0.2 02/06/2020 1014   NITRITE NEGATIVE 06/30/2022 1334   LEUKOCYTESUR MODERATE (A) 06/30/2022 1334    LEUKOCYTESUR 3+ 09/14/2013 2123   Sepsis Labs: @LABRCNTIP (procalcitonin:4,lacticidven:4) Microbiology: No results found for this or any previous visit (from the past 240 hour(s)).    Radiology Studies last 3 days: ECHOCARDIOGRAM COMPLETE  Result Date: 07/01/2022    ECHOCARDIOGRAM REPORT   Patient Name:   Candace [name] Date of Exam: 06/30/2022 Medical Rec #:  [id number]       Height:       62.0 in Accession #:  [id number]      Weight:       166.0 lb Date of Birth:  03/03/1951       BSA:          1.766 m Patient Age:    36 years        BP:           136/85 mmHg Patient Gender: F               HR:           67 bpm. Exam Location:  [facility] Procedure: 2D Echo, Cardiac Doppler, Color Doppler and Intracardiac            Opacification Agent Indications:     R07.9 Chest pain                  R06.00 Dyspnea  History:         Patient has prior history of Echocardiogram examinations, most                  recent 07/16/2021. CAD and Previous Myocardial Infarction, Prior                  CABG, Stroke, TIA and COPD, Signs/Symptoms:Dyspnea; Risk                  Factors:Hypertension, Dyslipidemia and Diabetes.  Sonographer:     Cresenciano Lick RDCS Referring Phys:  [id number] AMY N COX Diagnosing Phys: Yolonda Kida MD IMPRESSIONS  1. Left ventricular ejection fraction, by estimation, is 65 to 70%. The left ventricle has normal function. The left ventricle has no regional wall motion abnormalities. There is mild concentric left ventricular hypertrophy. Left ventricular diastolic parameters are consistent with Grade I diastolic dysfunction (impaired relaxation).  2. Right ventricular systolic function is mildly reduced. The right ventricular size is mildly enlarged. Mildly increased right ventricular wall thickness.  3. Left atrial size was mildly dilated.  4. Right atrial size was mildly dilated.  5. The mitral valve is grossly normal. Trivial mitral valve regurgitation.  6. The aortic valve is normal in  structure. Aortic valve regurgitation is trivial. Aortic valve sclerosis is present, with no evidence of aortic valve stenosis. FINDINGS  Left Ventricle: Left ventricular ejection fraction, by estimation, is 65 to 70%. The left ventricle has normal function. The left ventricle has no regional wall motion abnormalities. Definity contrast agent was given IV to delineate the left ventricular  endocardial borders. The left ventricular internal cavity size was normal in size. There is mild concentric left ventricular hypertrophy. Left ventricular diastolic parameters are consistent with Grade I diastolic dysfunction (impaired relaxation). Right Ventricle: The right ventricular size is mildly enlarged. Mildly increased right ventricular wall thickness. Right ventricular systolic function is mildly reduced. Left Atrium: Left atrial size was mildly dilated. Right Atrium: Right atrial size was mildly dilated. Pericardium: There is no evidence of pericardial effusion. Mitral Valve: The mitral valve is grossly normal. Trivial mitral valve regurgitation. Tricuspid Valve: The tricuspid valve is normal in structure. Tricuspid valve regurgitation is mild. Aortic Valve: The aortic valve is normal in structure. Aortic valve regurgitation is trivial. Aortic valve sclerosis is present, with no evidence of aortic valve stenosis. Pulmonic Valve: The pulmonic valve was normal in structure. Pulmonic valve regurgitation is not visualized. Aorta: The ascending aorta was not well visualized. IAS/Shunts: No atrial level shunt detected by color flow Doppler.  LEFT VENTRICLE PLAX 2D LVIDd:  3.50 cm   Diastology LVIDs:         2.20 cm   LV e' medial:    5.93 cm/s LV PW:         1.30 cm   LV E/e' medial:  11.5 LV IVS:        1.30 cm   LV e' lateral:   4.78 cm/s LVOT diam:     1.90 cm   LV E/e' lateral: 14.2 LV SV:         55 LV SV Index:   31 LVOT Area:     2.84 cm  RIGHT VENTRICLE RV S prime:     6.20 cm/s TAPSE (M-mode): 1.3 cm LEFT  ATRIUM           Index        RIGHT ATRIUM           Index LA diam:      4.10 cm 2.32 cm/m   RA Area:     11.70 cm LA Vol (A2C): 52.1 ml 29.50 ml/m  RA Volume:   28.00 ml  15.85 ml/m LA Vol (A4C): 43.1 ml 24.40 ml/m  AORTIC VALVE LVOT Vmax:   93.30 cm/s LVOT Vmean:  62.900 cm/s LVOT VTI:    0.195 m  AORTA Ao Root diam: 3.00 cm MITRAL VALVE               TRICUSPID VALVE MV Area (PHT): 2.66 cm    TR Peak grad:   17.6 mmHg MV Decel Time: 286 msec    TR Vmax:        210.00 cm/s MV E velocity: 67.90 cm/s MV A velocity: 82.50 cm/s  SHUNTS MV E/A ratio:  0.82        Systemic VTI:  0.20 m                            Systemic Diam: 1.90 cm Yolonda Kida MD Electronically signed by Yolonda Kida MD Signature Date/Time: 07/01/2022/11:57:12 AM    Final    DG Chest 2 View  Result Date: 06/30/2022 CLINICAL DATA:  Dizziness, LEFT side chest pain radiating down LEFT arm. EXAM: CHEST - 2 VIEW COMPARISON:  03/25/2022 FINDINGS: Normal heart size post CABG Mediastinal contours and pulmonary vascularity normal. Atherosclerotic calcification aorta. Lungs clear. No pulmonary infiltrate, pleural effusion, or pneumothorax. Osseous demineralization with BILATERAL glenohumeral degenerative changes. IMPRESSION: Post CABG. No acute abnormalities. Aortic Atherosclerosis (ICD10-I70.0). Electronically Signed   By: Lavonia Dana M.D.   On: 06/30/2022 11:33             LOS: 0 days    Time spent: 50 min     Emeterio Reeve, DO [facility] 07/01/2022, 2:27 PM    Dictation software may have been used to generate the above note. Typos may occur and escape review in typed/dictated notes. Please contact Dr Sheppard Coil directly for clarity if needed.  Staff may message me via secure chat in [place]  but this may not receive an immediate response,  please page me for urgent matters!  If 7PM-7AM, please contact night coverage [email or web address]

## 2022-07-02 LAB — BASIC METABOLIC PANEL
Anion gap: 6 (ref 5–15)
BUN: 22 mg/dL (ref 8–23)
CO2: 23 mmol/L (ref 22–32)
Calcium: 8.2 mg/dL — ABNORMAL LOW (ref 8.9–10.3)
Chloride: 109 mmol/L (ref 98–111)
Creatinine, Ser: 0.98 mg/dL (ref 0.44–1.00)
GFR, Estimated: >60 mL/min (ref >60)
Glucose, Bld: 129 mg/dL — ABNORMAL HIGH (ref 70–99)
Potassium: 4.1 mmol/L (ref 3.5–5.1)
Sodium: 138 mmol/L (ref 135–145)

## 2022-07-02 LAB — GLUCOSE, CAPILLARY
Glucose-Capillary: 189 mg/dL — ABNORMAL HIGH (ref 70–99)
Glucose-Capillary: 132 mg/dL — ABNORMAL HIGH (ref 70–99)
Glucose-Capillary: 125 mg/dL — ABNORMAL HIGH (ref 70–99)

## 2022-07-02 LAB — CBC
HCT: 33.7 % — ABNORMAL LOW (ref 36.0–46.0)
Hemoglobin: 10.9 g/dL — ABNORMAL LOW (ref 12.0–15.0)
MCH: 31.7 pg (ref 26.0–34.0)
MCHC: 32.3 g/dL (ref 30.0–36.0)
MCV: 98 fL (ref 80.0–100.0)
Platelets: 137 10*3/uL — ABNORMAL LOW (ref 150–400)
RBC: 3.44 MIL/uL — ABNORMAL LOW (ref 3.87–5.11)
RDW: 12.4 % (ref 11.5–15.5)
WBC: 5.9 10*3/uL (ref 4.0–10.5)
nRBC: 0 % (ref 0.0–0.2)

## 2022-07-02 LAB — HEPARIN LEVEL (UNFRACTIONATED)
Heparin Unfractionated: 0.71 IU/mL — ABNORMAL HIGH (ref 0.30–0.70)
Heparin Unfractionated: 0.67 IU/mL (ref 0.30–0.70)

## 2022-07-02 LAB — URINE CULTURE: Culture: 40000 — AB

## 2022-07-02 LAB — TROPONIN I (HIGH SENSITIVITY)
Troponin I (High Sensitivity): 7 ng/L (ref <18)
Troponin I (High Sensitivity): 7 ng/L (ref <18)

## 2022-07-02 MED ORDER — ATORVASTATIN CALCIUM 80 MG PO TABS
80.0000 mg | ORAL_TABLET | Freq: Every day | ORAL | Status: DC
  Administered 2022-07-02: 80 mg via ORAL
  Filled 2022-07-02: qty 1

## 2022-07-02 MED ORDER — ATORVASTATIN CALCIUM 80 MG PO TABS: 80.0000 mg | ORAL_TABLET | Freq: Every day | ORAL | 0 refills | Status: AC

## 2022-07-02 MED ORDER — RANOLAZINE ER 1000 MG PO TB12: 1000.0000 mg | ORAL_TABLET | Freq: Two times a day (BID) | ORAL | 0 refills | Status: AC

## 2022-07-02 MED ORDER — ISOSORBIDE MONONITRATE ER 60 MG PO TB24
60.0000 mg | ORAL_TABLET | Freq: Every day | ORAL | 0 refills | Status: DC
Start: 2022-07-02 — End: 2023-01-17

## 2022-07-02 MED ORDER — RANOLAZINE ER 500 MG PO TB12
1000.0000 mg | ORAL_TABLET | Freq: Two times a day (BID) | ORAL | Status: DC
  Administered 2022-07-02: 1000 mg via ORAL
  Filled 2022-07-02: qty 2

## 2022-07-02 MED ORDER — ISOSORBIDE MONONITRATE ER 120 MG PO TB24
120.0000 mg | ORAL_TABLET | Freq: Every day | ORAL | 0 refills | Status: DC
Start: 2022-07-02 — End: 2022-07-02

## 2022-07-02 MED ORDER — ISOSORBIDE MONONITRATE ER 60 MG PO TB24
60.0000 mg | ORAL_TABLET | Freq: Two times a day (BID) | ORAL | Status: DC
  Administered 2022-07-02: 60 mg via ORAL
  Filled 2022-07-02: qty 1

## 2022-07-02 NOTE — Progress Notes (Signed)
Mobility Specialist - Progress Note   07/02/22 0933  Mobility  Activity Transferred from bed to chair;Ambulated independently in hallway  Level of Assistance Modified independent, requires aide device or extra time  Assistive Device Front wheel walker  Distance Ambulated (ft) 60 ft  Activity Response Tolerated well  $Mobility charge 1 Mobility   Pt supine upon entry, utilizing RA. Pt insisting to be "as independent as possible". Pt completed bed mob ModI-- extra time required to bring BLE to the ground. Pt STS to RW and amb SBA to supervision. Pt amb a self-selected 40 ft in the hallway, stopping three times for a standing rest break due to "legs feeling like lead". Pt returned to the room, impulsively tries to sit on the bed causing a LOB-- CGA for correction. Pt amb around the bed to the recliner, left seated with alarm set and needs within reach. RN and family member present upon MS exit.   Zetta Bills Mobility Specialist 07/02/22 9:36AM

## 2022-07-02 NOTE — Discharge Summary (Signed)
Physician Discharge Summary   Patient: Candace [name] W Bielinski MRN: [id number]  DOB: 1950/05/31   Admit:     Date of Admission: 06/30/2022 Admitted from: home   Discharge: Date of discharge: 07/02/22 Disposition: Home Condition at discharge: good  CODE STATUS: FULL CODE      Discharge Physician: Sunnie NielsenNatalie Joeann Steppe, DO [facility]     PCP: Bosie ClosGilbert, Richard L, MD  Recommendations for Outpatient Follow-up:  Follow up with PCP Bosie ClosGilbert, Richard L, MD in 1-2 weeks Follow up w/ cardiology early next week  Please obtain labs/tests: CBC, BMP in 1-2 weeks Please follow up on the following pending results: none PCP AND OTHER OUTPATIENT PROVIDERS: SEE BELOW FOR SPECIFIC DISCHARGE INSTRUCTIONS PRINTED FOR PATIENT IN ADDITION TO GENERIC AVS PATIENT INFO    Discharge Instructions     (HEART FAILURE PATIENTS) Call MD:  Anytime you have any of the following symptoms: 1) 3 pound weight gain in 24 hours or 5 pounds in 1 week 2) shortness of breath, with or without a dry hacking cough 3) swelling in the hands, feet or stomach 4) if you have to sleep on extra pillows at night in order to breathe.   Complete by: As directed    Call MD for:  extreme fatigue   Complete by: As directed    Call MD for:  persistant dizziness or light-headedness   Complete by: As directed    Diet - low sodium heart healthy   Complete by: As directed    Increase activity slowly   Complete by: As directed          Discharge Diagnoses: Principal Problem:   Chest pain Active Problems:   Essential hypertension   Type 2 diabetes mellitus with diabetic polyneuropathy, with long-term current use of insulin   Depression with anxiety   Gastroesophageal reflux disease   Mixed hyperlipidemia   Coronary artery disease   AF (paroxysmal atrial fibrillation)   Esophageal dysphagia   History of coronary artery bypass graft   Obesity (BMI 30-39.9)   Suprapubic abdominal pain       Hospital Course: Ms. Loney HeringGay  [name] is a 72 year old female with history of CAD status post CABG, LIMA to LAD, SVG to PDA and OM, DES to SVG in 2021, and [facility], anxiety, depression, hyperlipidemia, insulin-dependent diabetes mellitus, GERD, atrial fibrillation, on Eliquis, mild expressive aphasia post CVA about 3 years ago, who presents to emergency department for chief concerns of chest pain, shortness of breath and diaphoresis while in her car trying to go to the grocery store. 04/03: High sensitive troponin was 10. EKG showed sinus rhythm with rate of 79, QTc 43, mild change in leads II with deeper ST depression when compared to her EKG. ED treatment: Ondansetron 4 mg IV one-time dose, sodium chloride 1 L bolus, morphine 2 mg IV. EMS gave ASA. Pt admitted to hospitalist service, cardiology consulted.  04/04: hypotensive intermittently without symptoms - reduced imdur, holding entresto and metoprolol. Persistent epigastric pain and sharp chest discomfort, ACS unlikely, plan CT abd/pelvis. UA mod leuks but no dysuria/frequency, await UCx. Repeat echo with preserved EF 65-70% without regional wall motion abnormalities, essentially unchanged from prior study 06/2021. CT Abd/Pelv (+)distended stomach, no other acute processes, (+)hepatic steatosis.  04/05: pain resolved, had some pressure later in AM but resolved and EKG and tropnin x2 were non-concerning. Heparin gtt discontinues. Cardiology cleared for discharge to follow outpatient, increase imdur and starting ranexa.  Consultants:  Cardiology  Procedures: None       ASSESSMENT & PLAN:   Principal Problem:   Chest pain Active Problems:   Essential hypertension   Type 2 diabetes mellitus with diabetic polyneuropathy, with long-term current use of insulin   Depression with anxiety   Gastroesophageal reflux disease   Mixed hyperlipidemia   Coronary artery disease   AF (paroxysmal atrial fibrillation)   Esophageal  dysphagia   History of coronary artery bypass graft   Obesity (BMI 30-39.9)   Suprapubic abdominal pain  Chest pain, Unstable Angina w/ underlying CAD  Etiology workup in progress, given patient's extensive history of CAD with multiple coronary arteries with stenosis of varying degrees, ACS cannot be excluded at this time Continue to monitor high-sensitivity troponin level Per cardiology, pt was offered the a Lexiscan Myoview for noninvasive ischemic evaluation, but she politely declined as she experienced severe GI upset/cramping last Myoview 06/2021. D/c nitropaste d/t hypotension and unlikely ACS Discharging on higher dose Imdur  Starting ranexa Continue aspirin 81 mg daily Continue Zetia, Crestor 40 mg daily  Coronary artery disease History of coronary artery bypass graft LIMA to LAD, SVG to PDA and OM in 2017 DES in 2021 and balloon angioplasty Status post aspirin 342 mg via EMS Resumed home aspirin 81 mg daily, rosuvastatin Holding metoprolol tartrate, Entresto d/t low BP Prior Plavix 75 mg daily was d/c prior to arrival  Imdur 60 mg daily were not resumed on admission as these medications were discontinued by outpatient cardiology 1 week ago Cardiology following    Advance care planning On admission at bedside, patient reports that she would like 1 attempt at chest compressions, it was discussed that that would mean we would put her at full code, patient is in agreement with this   Suprapubic abdominal pain without urinary complaints  UA (+)Leuk but min WBC, no nitrite  UCx insignificant growth Monitor outpatient   Type 2 diabetes mellitus with diabetic polyneuropathy, with long-term current use of insulin Long-acting insulin 12 units subcutaneous nightly resumed for 07/01/2022  AF (paroxysmal atrial fibrillation) metoprolol tartrate 100 mg p.o. twice daily rate control held d/t low BP but rate has been controlled Resume Eliquis when heparin d/c   Essential  hypertension Home metoprolol to tartrate 100 mg p.o. twice daily held d/t low BP  Mixed hyperlipidemia Statin + zetia   Gastroesophageal reflux disease Home PPI twice daily resumed  Depression with anxiety home sertraline 100 mg daily           Discharge Instructions  Allergies as of 07/02/2022       Reactions   Tramadol Other (See Comments)   Causes patient to be off balance and Mental Changes   Lisinopril Cough   Penicillins Swelling, Rash, Other (See Comments)   Did it involve swelling of the face/tongue/throat, SOB, or low BP? Yes Did it involve sudden or severe rash/hives, skin peeling, or any reaction on the inside of your mouth or nose? Yes Did you need to seek medical attention at a hospital or doctor's office? Yes When did it last happen?      15 years If all above answers are "NO", may proceed with cephalosporin use.        Medication List     STOP taking these medications    ALPRAZolam 0.25 MG tablet Commonly known as: XANAX   clopidogrel 75 MG tablet Commonly known as: PLAVIX   Jardiance 25 MG Tabs tablet Generic drug: empagliflozin   metoprolol tartrate  100 MG tablet Commonly known as: LOPRESSOR   rosuvastatin 40 MG tablet Commonly known as: CRESTOR   sacubitril-valsartan 24-26 MG Commonly known as: ENTRESTO   Vitamin D (Ergocalciferol) 1.25 MG (50000 UNIT) Caps capsule Commonly known as: DRISDOL       TAKE these medications    acetaminophen 325 MG tablet Commonly known as: TYLENOL Take 650 mg by mouth every 6 (six) hours as needed for moderate pain or headache.   albuterol 108 (90 Base) MCG/ACT inhaler Commonly known as: VENTOLIN HFA Inhale 2 puffs into the lungs every 6 (six) hours as needed for wheezing or shortness of breath.   aspirin EC 81 MG tablet Take 81 mg by mouth daily. Swallow whole.   atorvastatin 80 MG tablet Commonly known as: LIPITOR Take 1 tablet (80 mg total) by mouth daily. Start taking on: July 03, 2022   Comfort EZ Pen Needles 32G X 4 MM Misc Generic drug: Insulin Pen Needle Use to inject insulin daily   Eliquis 5 MG Tabs tablet Generic drug: apixaban Take 5 mg by mouth 2 (two) times daily.   ezetimibe 10 MG tablet Commonly known as: ZETIA Take 1 tablet (10 mg total) by mouth daily.   glucose blood test strip Use to check blood sugars daily as instructed   isosorbide mononitrate 60 MG 24 hr tablet Commonly known as: IMDUR Take 1 tablet (60 mg total) by mouth daily.   Lantus SoloStar 100 UNIT/ML Solostar Pen Generic drug: insulin glargine Inject 12 Units into the skin at bedtime.   loperamide 2 MG capsule Commonly known as: IMODIUM Take 2 mg by mouth daily as needed for diarrhea or loose stools.   nitroGLYCERIN 0.4 MG SL tablet Commonly known as: NITROSTAT Place 1 tablet (0.4 mg total) under the tongue every 5 (five) minutes as needed for chest pain.   nystatin powder Commonly known as: nystatin Apply 1 application topically daily. What changed:  when to take this reasons to take this   ondansetron 8 MG disintegrating tablet Commonly known as: ZOFRAN-ODT TAKE ONE TABLET BY MOUTH EVERY 8 HOURS AS NEEDED FOR NAUSEA AND VOMITING   OneTouch Delica Plus Lancet33G Misc USE UP TO 4 TIMES DAILY AS DIRECTED   OneTouch Verio w/Device Kit Use daily to check blood sugar   pantoprazole 20 MG tablet Commonly known as: Protonix Take 1 tablet (20 mg total) by mouth 2 (two) times daily.   ranolazine 1000 MG SR tablet Commonly known as: RANEXA Take 1 tablet (1,000 mg total) by mouth 2 (two) times daily.   sertraline 100 MG tablet Commonly known as: ZOLOFT Take 1 tablet (100 mg total) by mouth daily.   Trulicity 1.5 MG/0.5ML Sopn Generic drug: Dulaglutide Inject 1.5 mg into the skin once a week.         Follow-up Information     Alwyn Pea, MD. Go in 1 week(s).   Specialties: Cardiology, Internal Medicine Why: call on Monday 07/05/22 to confirm  apptointment Contact information: [address] [place] [address] [phone]                 Allergies  Allergen Reactions   Tramadol Other (See Comments)    Causes patient to be off balance and Mental Changes   Lisinopril Cough   Penicillins Swelling, Rash and Other (See Comments)    Did it involve swelling of the face/tongue/throat, SOB, or low BP? Yes Did it involve sudden or severe rash/hives, skin peeling, or any reaction on  the inside of your mouth or nose? Yes Did you need to seek medical attention at a hospital or doctor's office? Yes When did it last happen?      15 years If all above answers are "NO", may proceed with cephalosporin use.      Subjective: pt reports no chest pain or trouble  breathing, no concerns for dsischarge.    Discharge Exam: BP 120/65 (BP Location: Right Arm)   Pulse 82   Temp 98 F (36.7 C)   Resp 18   Ht 5\' 2"  (1.575 m)   Wt 75.3 kg   SpO2 96%   BMI 30.36 kg/m  General: Pt is alert, awake, not in acute distress Cardiovascular: RRR, S1/S2 +, no rubs, no gallops Respiratory: CTA bilaterally, no wheezing, no rhonchi Abdominal: Soft, NT, ND, bowel sounds + Extremities: no edema, no cyanosis     The results of significant diagnostics from this hospitalization (including imaging, microbiology, ancillary and laboratory) are listed below for reference.     Microbiology: Recent Results (from the past 240 hour(s))  Urine Culture (for pregnant, neutropenic or urologic patients or patients with an indwelling urinary catheter)     Status: Abnormal   Collection Time: 06/30/22  1:34 PM   Specimen: Urine, Clean Catch  Result Value Ref Range Status   Specimen Description   Final    URINE, CLEAN CATCH Performed at [facility], [address] [place], [place] [address]    Special Requests   Final    NONE Performed at [facility], [address] [place], [place] [address]    Culture (A)  Final     40,000 COLONIES/mL STREPTOCOCCUS AGALACTIAE TESTING AGAINST S. AGALACTIAE NOT ROUTINELY PERFORMED DUE TO PREDICTABILITY OF AMP/PEN/VAN SUSCEPTIBILITY. Performed at [facility], [address] [place], [place] [address]    Report Status 07/02/2022 FINAL  Final     Labs: BNP (last 3 results) Recent Labs    08/11/21 1259 08/15/21 0454 01/01/22 1040  BNP 70.4 82.4 32.4   Basic Metabolic Panel: Recent Labs  Lab 06/30/22 1100 07/02/22 0437  NA 140 138  K 4.2 4.1  CL 107 109  CO2 25 23  GLUCOSE 133* 129*  BUN 13 22  CREATININE 0.76 0.98  CALCIUM 9.4 8.2*   Liver Function Tests: No results for input(s): "AST", "ALT", "ALKPHOS", "BILITOT", "PROT", "ALBUMIN" in the last 168 hours. No results for input(s): "LIPASE", "AMYLASE" in the last 168 hours. No results for input(s): "AMMONIA" in the last 168 hours. CBC: Recent Labs  Lab 06/30/22 1100 07/01/22 0441 07/02/22 0437  WBC 6.7 7.6 5.9  HGB 14.5 12.6 10.9*  HCT 44.6 39.1 33.7*  MCV 97.8 99.5 98.0  PLT 181 175 137*   Cardiac Enzymes: No results for input(s): "CKTOTAL", "CKMB", "CKMBINDEX", "TROPONINI" in the last 168 hours. BNP: Invalid input(s): "POCBNP" CBG: Recent Labs  Lab 07/01/22 1649 07/01/22 2103 07/02/22 1011 07/02/22 1135 07/02/22 1606  GLUCAP 121* 146* 189* 132* 125*   D-Dimer No results for input(s): "DDIMER" in the last 72 hours. Hgb A1c Recent Labs    07/01/22 0441  HGBA1C 8.0*   Lipid Profile No results for input(s): "CHOL", "HDL", "LDLCALC", "TRIG", "CHOLHDL", "LDLDIRECT" in the last 72 hours. Thyroid function studies No results for input(s): "TSH", "T4TOTAL", "T3FREE", "THYROIDAB" in the last 72 hours.  Invalid input(s): "FREET3" Anemia work up No results for input(s): "VITAMINB12", "FOLATE", "FERRITIN", "TIBC", "IRON", "RETICCTPCT" in the last 72 hours. Urinalysis  Component Value Date/Time   COLORURINE YELLOW (A) 06/30/2022 1334   APPEARANCEUR HAZY (A) 06/30/2022 1334    APPEARANCEUR Clear 12/24/2014 0905   LABSPEC 1.029 06/30/2022 1334   LABSPEC 1.030 09/14/2013 2123   PHURINE 5.0 06/30/2022 1334   GLUCOSEU >=500 (A) 06/30/2022 1334   GLUCOSEU Negative 09/14/2013 2123   HGBUR SMALL (A) 06/30/2022 1334   BILIRUBINUR NEGATIVE 06/30/2022 1334   BILIRUBINUR Negative 02/06/2020 1014   BILIRUBINUR Negative 12/24/2014 0905   BILIRUBINUR Negative 09/14/2013 2123   KETONESUR NEGATIVE 06/30/2022 1334   PROTEINUR NEGATIVE 06/30/2022 1334   UROBILINOGEN 0.2 02/06/2020 1014   NITRITE NEGATIVE 06/30/2022 1334   LEUKOCYTESUR MODERATE (A) 06/30/2022 1334   LEUKOCYTESUR 3+ 09/14/2013 2123   Sepsis Labs Recent Labs  Lab 06/30/22 1100 07/01/22 0441 07/02/22 0437  WBC 6.7 7.6 5.9   Microbiology Recent Results (from the past 240 hour(s))  Urine Culture (for pregnant, neutropenic or urologic patients or patients with an indwelling urinary catheter)     Status: Abnormal   Collection Time: 06/30/22  1:34 PM   Specimen: Urine, Clean Catch  Result Value Ref Range Status   Specimen Description   Final    URINE, CLEAN CATCH Performed at [facility], [address], [place], [place] [address]    Special Requests   Final    NONE Performed at [facility], [address] [place], [place] [address]    Culture (A)  Final    40,000 COLONIES/mL STREPTOCOCCUS AGALACTIAE TESTING AGAINST S. AGALACTIAE NOT ROUTINELY PERFORMED DUE TO PREDICTABILITY OF AMP/PEN/VAN SUSCEPTIBILITY. Performed at [facility], [address] [place], [place] [address]    Report Status 07/02/2022 FINAL  Final   Imaging CT ABDOMEN PELVIS W CONTRAST  Result Date: 07/01/2022 CLINICAL DATA:  Acute nonlocalized abdominal pain EXAM: CT ABDOMEN AND PELVIS WITH CONTRAST TECHNIQUE: Multidetector CT imaging of the abdomen and pelvis was performed using the standard protocol following bolus administration of intravenous contrast. RADIATION DOSE REDUCTION: This exam was  performed according to the departmental dose-optimization program which includes automated exposure control, adjustment of the mA and/or kV according to patient size and/or use of iterative reconstruction technique. CONTRAST:  OMNIPAQUE IOHEXOL 300 MG/ML  SOLN COMPARISON:  10/05/2020 FINDINGS: Lower chest: No acute abnormality. Hepatobiliary: Mild hepatic steatosis. Unremarkable gallbladder and biliary tree. Pancreas: Unremarkable. Spleen: Unremarkable. Adrenals/Urinary Tract: Normal adrenal glands. No urinary calculi or hydronephrosis. Unremarkable bladder. Stomach/Bowel: Normal caliber large and small bowel. Colonic diverticulosis without diverticulitis. No bowel wall thickening. Stomach is distended. Vascular/Lymphatic: Aortic atherosclerosis. No enlarged abdominal or pelvic lymph nodes. Reproductive: Hysterectomy. Other: No free intraperitoneal fluid or air. Musculoskeletal: Thoracolumbar spondylosis. No acute osseous abnormality. IMPRESSION: No acute abnormality in the abdomen or pelvis. Hepatic steatosis. Distended stomach. Electronically Signed   By: Minerva Fester M.D.   On: 07/01/2022 20:10   ECHOCARDIOGRAM COMPLETE  Result Date: 07/01/2022    ECHOCARDIOGRAM REPORT   Patient Name:   SHAKEITHA UMBAUGH Shoff Date of Exam: 06/30/2022 Medical Rec #:  [id number]       Height:       62.0 in Accession #:    [id number]      Weight:       166.0 lb Date of Birth:  12-31-1950       BSA:          1.766 m Patient Age:    72 years        BP:  136/85 mmHg Patient Gender: F               HR:           67 bpm. Exam Location:  [facility] Procedure: 2D Echo, Cardiac Doppler, Color Doppler and Intracardiac            Opacification Agent Indications:     R07.9 Chest pain                  R06.00 Dyspnea  History:         Patient has prior history of Echocardiogram examinations, most                  recent 07/16/2021. CAD and Previous Myocardial Infarction, Prior                  CABG, Stroke, TIA and COPD,  Signs/Symptoms:Dyspnea; Risk                  Factors:Hypertension, Dyslipidemia and Diabetes.  Sonographer:     Daphine Deutscher RDCS Referring Phys:  [id number] AMY N COX Diagnosing Phys: Alwyn Pea MD IMPRESSIONS  1. Left ventricular ejection fraction, by estimation, is 65 to 70%. The left ventricle has normal function. The left ventricle has no regional wall motion abnormalities. There is mild concentric left ventricular hypertrophy. Left ventricular diastolic parameters are consistent with Grade I diastolic dysfunction (impaired relaxation).  2. Right ventricular systolic function is mildly reduced. The right ventricular size is mildly enlarged. Mildly increased right ventricular wall thickness.  3. Left atrial size was mildly dilated.  4. Right atrial size was mildly dilated.  5. The mitral valve is grossly normal. Trivial mitral valve regurgitation.  6. The aortic valve is normal in structure. Aortic valve regurgitation is trivial. Aortic valve sclerosis is present, with no evidence of aortic valve stenosis. FINDINGS  Left Ventricle: Left ventricular ejection fraction, by estimation, is 65 to 70%. The left ventricle has normal function. The left ventricle has no regional wall motion abnormalities. Definity contrast agent was given IV to delineate the left ventricular  endocardial borders. The left ventricular internal cavity size was normal in size. There is mild concentric left ventricular hypertrophy. Left ventricular diastolic parameters are consistent with Grade I diastolic dysfunction (impaired relaxation). Right Ventricle: The right ventricular size is mildly enlarged. Mildly increased right ventricular wall thickness. Right ventricular systolic function is mildly reduced. Left Atrium: Left atrial size was mildly dilated. Right Atrium: Right atrial size was mildly dilated. Pericardium: There is no evidence of pericardial effusion. Mitral Valve: The mitral valve is grossly normal. Trivial  mitral valve regurgitation. Tricuspid Valve: The tricuspid valve is normal in structure. Tricuspid valve regurgitation is mild. Aortic Valve: The aortic valve is normal in structure. Aortic valve regurgitation is trivial. Aortic valve sclerosis is present, with no evidence of aortic valve stenosis. Pulmonic Valve: The pulmonic valve was normal in structure. Pulmonic valve regurgitation is not visualized. Aorta: The ascending aorta was not well visualized. IAS/Shunts: No atrial level shunt detected by color flow Doppler.  LEFT VENTRICLE PLAX 2D LVIDd:         3.50 cm   Diastology LVIDs:         2.20 cm   LV e' medial:    5.93 cm/s LV PW:         1.30 cm   LV E/e' medial:  11.5 LV IVS:        1.30 cm   LV e' lateral:  4.78 cm/s LVOT diam:     1.90 cm   LV E/e' lateral: 14.2 LV SV:         55 LV SV Index:   31 LVOT Area:     2.84 cm  RIGHT VENTRICLE RV S prime:     6.20 cm/s TAPSE (M-mode): 1.3 cm LEFT ATRIUM           Index        RIGHT ATRIUM           Index LA diam:      4.10 cm 2.32 cm/m   RA Area:     11.70 cm LA Vol (A2C): 52.1 ml 29.50 ml/m  RA Volume:   28.00 ml  15.85 ml/m LA Vol (A4C): 43.1 ml 24.40 ml/m  AORTIC VALVE LVOT Vmax:   93.30 cm/s LVOT Vmean:  62.900 cm/s LVOT VTI:    0.195 m  AORTA Ao Root diam: 3.00 cm MITRAL VALVE               TRICUSPID VALVE MV Area (PHT): 2.66 cm    TR Peak grad:   17.6 mmHg MV Decel Time: 286 msec    TR Vmax:        210.00 cm/s MV E velocity: 67.90 cm/s MV A velocity: 82.50 cm/s  SHUNTS MV E/A ratio:  0.82        Systemic VTI:  0.20 m                            Systemic Diam: 1.90 cm Alwyn Pea MD Electronically signed by Alwyn Pea MD Signature Date/Time: 07/01/2022/11:57:12 AM    Final    DG Chest 2 View  Result Date: 06/30/2022 CLINICAL DATA:  Dizziness, LEFT side chest pain radiating down LEFT arm. EXAM: CHEST - 2 VIEW COMPARISON:  03/25/2022 FINDINGS: Normal heart size post CABG Mediastinal contours and pulmonary vascularity normal.  Atherosclerotic calcification aorta. Lungs clear. No pulmonary infiltrate, pleural effusion, or pneumothorax. Osseous demineralization with BILATERAL glenohumeral degenerative changes. IMPRESSION: Post CABG. No acute abnormalities. Aortic Atherosclerosis (ICD10-I70.0). Electronically Signed   By: Ulyses Southward M.D.   On: 06/30/2022 11:33      Time coordinating discharge: over 30 minutes  SIGNED:  Sunnie Nielsen DO [facility]

## 2022-07-02 NOTE — TOC Progression Note (Signed)
Transition of Care [facility]) - Progression Note    Patient Details  Name: Candace [name] MRN: [id number] Date of Birth: [birth year]  [facility] of [facility] CM/SW Contact  Truddie Hidden, RN Phone Number: 07/02/2022, 12:55 PM  Clinical Narrative:    Case reviewed for DME needs and changes in discharge disposition.         Expected Discharge Plan and Services                                               Social Determinants of Health (SDOH) Interventions SDOH Screenings   Food Insecurity: No Food Insecurity (06/30/2022)  Housing: Low Risk  (06/30/2022)  Transportation Needs: No Transportation Needs (06/30/2022)  Utilities: Not At Risk (06/30/2022)  Alcohol Screen: Low Risk  (08/10/2021)  Depression (PHQ2-9): Low Risk  (03/26/2022)  Financial Resource Strain: Low Risk  (03/26/2022)  Physical Activity: Insufficiently Active (08/10/2021)  Social Connections: Moderately Integrated (03/26/2022)  Stress: No Stress Concern Present (03/26/2022)  Tobacco Use: Medium Risk (06/30/2022)    Readmission Risk Interventions     No data to display

## 2022-07-02 NOTE — Progress Notes (Signed)
[facility] CLINIC CARDIOLOGY CONSULT NOTE       Patient ID: Candace [name] MRN: [id number] DOB/AGE: 1951-02-26 72 y.o.  Admit date: 06/30/2022 Referring Physician Dr. Londell Moh Primary Physician Dr. Julieanne Manson Primary Cardiologist Dr. Juliann Pares Reason for Consultation unstable angina  HPI: Candace [name] is a 72yoF with a PMH of severe 3vCAD s/p CABG x 4 (atretic LIMA-LAD, occluded SVG to OM, patent SVG to RCA, with -70-80% mid LAD, 60-70% diffuse LCx disease (small vessel) and 90% distal RCA by [facility] 07/11/2020), HFpEF (EF 55-60%, G1 DD 07/16/2021), paroxysmal atrial fibrillation (Eliquis), hx TIA, DM2, essential tremor who presented to [facility] ED 06/30/2022 with chest pain that started while she was walking in the grocery store similar to her prior anginal pain.  Cardiology is consulted for further assistance.  Interval History:  - per nursing and MAR review, did not need opioids overnight for pain - more alert and conversant this AM  - CT abd pelvis with stomach distention and fatty liver, without acute abnormalities  - continues to report 5-6/10 "sharp" pain at rest that has been constant and unchanged since admission. BP better today.  - abdominal pain resolved, no shortness of breath, nausea, diaphoresis - ate yogurt and OJ for breakfast  Review of systems complete and found to be negative unless listed above     Past Medical History:  Diagnosis Date   Allergy    Anemia    Anxiety    Arrhythmia    Arthritis    Atrial fibrillation    CHF (congestive heart failure)    COPD (chronic obstructive pulmonary disease)    Coronary artery disease    Depression    Diabetes mellitus without complication    Dyspnea    doe   Dysrhythmia    GERD (gastroesophageal reflux disease)    Headache    History of hiatal hernia    Hyperlipidemia    Hypertension    Myocardial infarction    2016, 04/2017   Myocardial infarction with cardiac rehabilitation    MI 2016/ CABG 8/17    FINISHED  CARDIAC REHAB 3 WEEKS AGO   Panic attack    Pneumonia    Reflux    Stroke 2015   showed up on MRI; no weakness noted   TIA (transient ischemic attack)    Voice tremor     Past Surgical History:  Procedure Laterality Date   ABDOMINAL HYSTERECTOMY     APPENDECTOMY  1975   ARTERY BIOPSY Right 04/26/2016   Procedure: BIOPSY TEMPORAL ARTERY;  Surgeon: Vernie Murders, MD;  Location: [facility] ORS;  Service: ENT;  Laterality: Right;   CARDIAC CATHETERIZATION N/A 11/06/2015   Procedure: Left Heart Cath and Coronary Angiography;  Surgeon: Lamar Blinks, MD;  Location: [facility] INVASIVE CV LAB;  Service: Cardiovascular;  Laterality: N/A;   CESAREAN SECTION     COLONOSCOPY  2015   COLONOSCOPY WITH PROPOFOL N/A 11/03/2018   Procedure: COLONOSCOPY WITH PROPOFOL;  Surgeon: Pasty Spillers, MD;  Location: [facility] ENDOSCOPY;  Service: Endoscopy;  Laterality: N/A;   CORONARY ANGIOPLASTY  04/2017   [facility]   CORONARY ARTERY BYPASS GRAFT N/A 11/10/2015   Procedure: CORONARY ARTERY BYPASS GRAFTING (CABG), ON PUMP, TIMES FOUR, USING LEFT INTERNAL MAMMARY ARTERY, BILATERAL GREATER SAPHENOUS VEINS HARVESTED ENDOSCOPICALLY;  Surgeon: Delight Ovens, MD;  Location: [facility] OR;  Service: Open Heart Surgery;  Laterality: N/A;  LIMA-LAD; SEQ SVG-OM1-OM2; SVG-PL   CORONARY STENT INTERVENTION N/A 08/05/2016   Procedure: Coronary Stent  Intervention;  Surgeon: Marcina Millard, MD;  Location: [facility] INVASIVE CV LAB;  Service: Cardiovascular;  Laterality: N/A;   ESOPHAGOGASTRODUODENOSCOPY (EGD) WITH PROPOFOL N/A 11/03/2018   Procedure: ESOPHAGOGASTRODUODENOSCOPY (EGD) WITH PROPOFOL;  Surgeon: Pasty Spillers, MD;  Location: [facility] ENDOSCOPY;  Service: Endoscopy;  Laterality: N/A;   HYSTERECTOMY ABDOMINAL WITH SALPINGO-OOPHORECTOMY Bilateral 08/15/2017   Procedure: HYSTERECTOMY ABDOMINAL WITH BILATERAL SALPINGO-OOPHORECTOMY;  Surgeon: Hildred Laser, MD;  Location: [facility] ORS;  Service: Gynecology;  Laterality:  Bilateral;   LEFT HEART CATH AND CORONARY ANGIOGRAPHY N/A 08/05/2016   Procedure: Left Heart Cath and Coronary Angiography;  Surgeon: Marcina Millard, MD;  Location: [facility] INVASIVE CV LAB;  Service: Cardiovascular;  Laterality: N/A;   LEFT HEART CATH AND CORS/GRAFTS ANGIOGRAPHY N/A 09/19/2018   Procedure: LEFT HEART CATH AND CORS/GRAFTS ANGIOGRAPHY;  Surgeon: Lamar Blinks, MD;  Location: [facility] INVASIVE CV LAB;  Service: Cardiovascular;  Laterality: N/A;   LEFT HEART CATH AND CORS/GRAFTS ANGIOGRAPHY N/A 08/30/2019   Procedure: LEFT HEART CATH AND CORS/GRAFTS ANGIOGRAPHY;  Surgeon: Dalia Heading, MD;  Location: [facility] INVASIVE CV LAB;  Service: Cardiovascular;  Laterality: N/A;   OOPHORECTOMY     TEE WITHOUT CARDIOVERSION N/A 11/10/2015   Procedure: TRANSESOPHAGEAL ECHOCARDIOGRAM (TEE);  Surgeon: Delight Ovens, MD;  Location: [facility] OR;  Service: Open Heart Surgery;  Laterality: N/A;   TUBAL LIGATION      Medications Prior to Admission  Medication Sig Dispense Refill Last Dose   acetaminophen (TYLENOL) 325 MG tablet Take 650 mg by mouth every 6 (six) hours as needed for moderate pain or headache.    prn at unk   albuterol (VENTOLIN HFA) 108 (90 Base) MCG/ACT inhaler Inhale 2 puffs into the lungs every 6 (six) hours as needed for wheezing or shortness of breath. 8 g 2 prn at unk   Dulaglutide (TRULICITY) 1.5 MG/0.5ML SOPN Inject 1.5 mg into the skin once a week. 6 mL 1 06/23/2022   ELIQUIS 5 MG TABS tablet Take 5 mg by mouth 2 (two) times daily.   06/30/2022 at 0730   empagliflozin (JARDIANCE) 25 MG TABS tablet TAKE ONE TABLET BY MOUTH BEFORE BREAKFAST 90 tablet 1 06/30/2022   ezetimibe (ZETIA) 10 MG tablet Take 1 tablet (10 mg total) by mouth daily. 90 tablet 1 06/30/2022   insulin glargine (LANTUS SOLOSTAR) 100 UNIT/ML Solostar Pen Inject 12 Units into the skin at bedtime. (Patient taking differently: Inject 12 Units into the skin at bedtime.) 18 mL 3 06/29/2022 at 2030   loperamide (IMODIUM) 2 MG  capsule Take 2 mg by mouth daily as needed for diarrhea or loose stools.   prn at unk   metoprolol tartrate (LOPRESSOR) 100 MG tablet Take 100 mg by mouth 2 (two) times daily.   06/30/2022   nitroGLYCERIN (NITROSTAT) 0.4 MG SL tablet Place 1 tablet (0.4 mg total) under the tongue every 5 (five) minutes as needed for chest pain. 30 tablet 0 prm at unk   nystatin (NYSTATIN) powder Apply 1 application topically daily. (Patient taking differently: Apply 1 application  topically as needed.) 60 g 5 prn at unk   ondansetron (ZOFRAN-ODT) 8 MG disintegrating tablet TAKE ONE TABLET BY MOUTH EVERY 8 HOURS AS NEEDED FOR NAUSEA AND VOMITING 30 tablet 1 -prn at unk   pantoprazole (PROTONIX) 20 MG tablet Take 1 tablet (20 mg total) by mouth 2 (two) times daily. 60 tablet 11 06/30/2022   rosuvastatin (CRESTOR) 40 MG tablet TAKE ONE TABLET BY MOUTH ONCE DAILY 90 tablet 0 06/30/2022  sacubitril-valsartan (ENTRESTO) 24-26 MG Take 1 tablet by mouth daily. 60 tablet 3 06/30/2022   sertraline (ZOLOFT) 100 MG tablet Take 1 tablet (100 mg total) by mouth daily. 90 tablet 1 06/30/2022   Vitamin D, Ergocalciferol, (DRISDOL) 1.25 MG (50000 UNIT) CAPS capsule Take 50,000 Units by mouth once a week.   Past Month   ALPRAZolam (XANAX) 0.25 MG tablet Take 1 tablet (0.25 mg total) by mouth every 8 (eight) hours as needed for anxiety. (Patient not taking: Reported on 06/30/2022) 90 tablet 5 Not Taking   aspirin EC 81 MG tablet Take 81 mg by mouth daily. Swallow whole.      Blood Glucose Monitoring Suppl (ONETOUCH VERIO) w/Device KIT Use daily to check blood sugar 1 kit 0    clopidogrel (PLAVIX) 75 MG tablet Take 1 tablet (75 mg total) by mouth daily. (Patient not taking: Reported on 06/30/2022) 90 tablet 1 Not Taking   COMFORT EZ PEN NEEDLES 32G X 4 MM MISC Use to inject insulin daily 100 each 1    glucose blood test strip Use to check blood sugars daily as instructed 100 each 12    isosorbide mononitrate (IMDUR) 60 MG 24 hr tablet Take 1 tablet  (60 mg total) by mouth daily. (Patient not taking: Reported on 06/30/2022) 90 tablet 3 Not Taking   Lancets (ONETOUCH DELICA PLUS LANCET33G) MISC USE UP TO 4 TIMES DAILY AS DIRECTED 100 each 0     Social History   Socioeconomic History   Marital status: Divorced    Spouse name: Not on file   Number of children: 3   Years of education: Not on file   Highest education level: Some college, no degree  Occupational History   Occupation: retired  Tobacco Use   Smoking status: Former    Packs/day: .5    Types: Cigarettes    Quit date: 10/07/2001    Years since quitting: 20.7   Smokeless tobacco: Never  Vaping Use   Vaping Use: Never used  Substance and Sexual Activity   Alcohol use: No    Alcohol/week: 0.0 standard drinks of alcohol   Drug use: No   Sexual activity: Not Currently  Other Topics Concern   Not on file  Social History Narrative   Lives at home alone   Social Determinants of Health   Financial Resource Strain: Low Risk  (03/26/2022)   Overall Financial Resource Strain (CARDIA)    Difficulty of Paying Living Expenses: Not hard at all  Food Insecurity: No Food Insecurity (06/30/2022)   Hunger Vital Sign    Worried About Running Out of Food in the Last Year: Never true    Ran Out of Food in the Last Year: Never true  Transportation Needs: No Transportation Needs (06/30/2022)   PRAPARE - Administrator, Civil ServiceTransportation    Lack of Transportation (Medical): No    Lack of Transportation (Non-Medical): No  Physical Activity: Insufficiently Active (08/10/2021)   Exercise Vital Sign    Days of Exercise per Week: 2 days    Minutes of Exercise per Session: 20 min  Stress: No Stress Concern Present (03/26/2022)   [employer] - Occupational Stress Questionnaire    Feeling of Stress : Not at all  Social Connections: Moderately Integrated (03/26/2022)   Social Connection and Isolation Panel [NHANES]    Frequency of Communication with Friends and Family: More than three  times a week    Frequency of Social Gatherings with Friends and Family: Twice a week  Attends Religious Services: More than 4 times per year    Active Member of Clubs or Organizations: Yes    Attends Banker Meetings: Never    Marital Status: Divorced  Catering manager Violence: Not At Risk (06/30/2022)   Humiliation, Afraid, Rape, and Kick questionnaire    Fear of Current or Ex-Partner: No    Emotionally Abused: No    Physically Abused: No    Sexually Abused: No    Family History  Problem Relation Age of Onset   Cancer Father    Hypertension Father    Heart disease Father    Asthma Father    Cancer Mother    Hypertension Mother    Pancreatic cancer Mother 60   Cancer Sister    Breast cancer Sister 53   Breast cancer Sister 85   Lung cancer Brother    Pancreatic cancer Sister 7   Cancer Sister       Intake/Output Summary (Last 24 hours) at 07/02/2022 0840 Last data filed at 07/02/2022 0700 Gross per 24 hour  Intake 368.36 ml  Output --  Net 368.36 ml     Vitals:   07/01/22 2126 07/02/22 0002 07/02/22 0400 07/02/22 0839  BP: (!) 144/71 130/76 112/62 115/63  Pulse: 75 86 94 84  Resp: 18 18 18 18   Temp:  98.7 F (37.1 C) 99.6 F (37.6 C) 98.8 F (37.1 C)  TempSrc:      SpO2: 97% 95% 93% 97%  Weight:      Height:        PHYSICAL EXAM General: Pleasant elderly Caucasian female, well nourished, in no acute distress.  Sitting upright in recliner in shared room in PCU HEENT:  Normocephalic and atraumatic. Neck:  No JVD.  Lungs: Normal respiratory effort on room air.  Decreased breath sounds without appreciable crackles or wheezes. Heart: HRRR . Normal S1 and S2 without gallops or murmurs.  Abdomen: Soft, nontender to palpation of epigastrium Msk: Normal strength and tone for age. Extremities: Warm and well perfused. No clubbing, cyanosis.  No peripheral edema.  Neuro: Alert and oriented X 3.  Generalized tremor to head and neck, slow speech cadence  which is baseline Psych:  Answers questions appropriately.   Labs: Basic Metabolic Panel: Recent Labs    06/30/22 1100 07/02/22 0437  NA 140 138  K 4.2 4.1  CL 107 109  CO2 25 23  GLUCOSE 133* 129*  BUN 13 22  CREATININE 0.76 0.98  CALCIUM 9.4 8.2*    Liver Function Tests: No results for input(s): "AST", "ALT", "ALKPHOS", "BILITOT", "PROT", "ALBUMIN" in the last 72 hours. No results for input(s): "LIPASE", "AMYLASE" in the last 72 hours. CBC: Recent Labs    07/01/22 0441 07/02/22 0437  WBC 7.6 5.9  HGB 12.6 10.9*  HCT 39.1 33.7*  MCV 99.5 98.0  PLT 175 137*    Cardiac Enzymes: Recent Labs    06/30/22 1100 06/30/22 1325 07/01/22 1059  TROPONINIHS 10 8 12     BNP: No results for input(s): "BNP" in the last 72 hours. D-Dimer: No results for input(s): "DDIMER" in the last 72 hours. Hemoglobin A1C: Recent Labs    07/01/22 0441  HGBA1C 8.0*   Fasting Lipid Panel: No results for input(s): "CHOL", "HDL", "LDLCALC", "TRIG", "CHOLHDL", "LDLDIRECT" in the last 72 hours. Thyroid Function Tests: No results for input(s): "TSH", "T4TOTAL", "T3FREE", "THYROIDAB" in the last 72 hours.  Invalid input(s): "FREET3" Anemia Panel: No results for input(s): "VITAMINB12", "FOLATE", "FERRITIN", "TIBC", "IRON", "  RETICCTPCT" in the last 72 hours.   Radiology: CT ABDOMEN PELVIS W CONTRAST  Result Date: 07/01/2022 CLINICAL DATA:  Acute nonlocalized abdominal pain EXAM: CT ABDOMEN AND PELVIS WITH CONTRAST TECHNIQUE: Multidetector CT imaging of the abdomen and pelvis was performed using the standard protocol following bolus administration of intravenous contrast. RADIATION DOSE REDUCTION: This exam was performed according to the departmental dose-optimization program which includes automated exposure control, adjustment of the mA and/or kV according to patient size and/or use of iterative reconstruction technique. CONTRAST:  OMNIPAQUE IOHEXOL 300 MG/ML  SOLN COMPARISON:   10/05/2020 FINDINGS: Lower chest: No acute abnormality. Hepatobiliary: Mild hepatic steatosis. Unremarkable gallbladder and biliary tree. Pancreas: Unremarkable. Spleen: Unremarkable. Adrenals/Urinary Tract: Normal adrenal glands. No urinary calculi or hydronephrosis. Unremarkable bladder. Stomach/Bowel: Normal caliber large and small bowel. Colonic diverticulosis without diverticulitis. No bowel wall thickening. Stomach is distended. Vascular/Lymphatic: Aortic atherosclerosis. No enlarged abdominal or pelvic lymph nodes. Reproductive: Hysterectomy. Other: No free intraperitoneal fluid or air. Musculoskeletal: Thoracolumbar spondylosis. No acute osseous abnormality. IMPRESSION: No acute abnormality in the abdomen or pelvis. Hepatic steatosis. Distended stomach. Electronically Signed   By: Minerva Fester M.D.   On: 07/01/2022 20:10   ECHOCARDIOGRAM COMPLETE  Result Date: 07/01/2022    ECHOCARDIOGRAM REPORT   Patient Name:   RAECHEL DUNKLEBERGER Grenier Date of Exam: 06/30/2022 Medical Rec #:  [id number]       Height:       62.0 in Accession #:    [id number]      Weight:       166.0 lb Date of Birth:  1950/10/28       BSA:          1.766 m Patient Age:    72 years        BP:           136/85 mmHg Patient Gender: F               HR:           67 bpm. Exam Location:  [facility] Procedure: 2D Echo, Cardiac Doppler, Color Doppler and Intracardiac            Opacification Agent Indications:     R07.9 Chest pain                  R06.00 Dyspnea  History:         Patient has prior history of Echocardiogram examinations, most                  recent 07/16/2021. CAD and Previous Myocardial Infarction, Prior                  CABG, Stroke, TIA and COPD, Signs/Symptoms:Dyspnea; Risk                  Factors:Hypertension, Dyslipidemia and Diabetes.  Sonographer:     Daphine Deutscher RDCS Referring Phys:  [id number] AMY N COX Diagnosing Phys: Alwyn Pea MD IMPRESSIONS  1. Left ventricular ejection fraction, by estimation, is 65 to 70%. The  left ventricle has normal function. The left ventricle has no regional wall motion abnormalities. There is mild concentric left ventricular hypertrophy. Left ventricular diastolic parameters are consistent with Grade I diastolic dysfunction (impaired relaxation).  2. Right ventricular systolic function is mildly reduced. The right ventricular size is mildly enlarged. Mildly increased right ventricular wall thickness.  3. Left atrial size was mildly dilated.  4. Right atrial size was mildly dilated.  5.  The mitral valve is grossly normal. Trivial mitral valve regurgitation.  6. The aortic valve is normal in structure. Aortic valve regurgitation is trivial. Aortic valve sclerosis is present, with no evidence of aortic valve stenosis. FINDINGS  Left Ventricle: Left ventricular ejection fraction, by estimation, is 65 to 70%. The left ventricle has normal function. The left ventricle has no regional wall motion abnormalities. Definity contrast agent was given IV to delineate the left ventricular  endocardial borders. The left ventricular internal cavity size was normal in size. There is mild concentric left ventricular hypertrophy. Left ventricular diastolic parameters are consistent with Grade I diastolic dysfunction (impaired relaxation). Right Ventricle: The right ventricular size is mildly enlarged. Mildly increased right ventricular wall thickness. Right ventricular systolic function is mildly reduced. Left Atrium: Left atrial size was mildly dilated. Right Atrium: Right atrial size was mildly dilated. Pericardium: There is no evidence of pericardial effusion. Mitral Valve: The mitral valve is grossly normal. Trivial mitral valve regurgitation. Tricuspid Valve: The tricuspid valve is normal in structure. Tricuspid valve regurgitation is mild. Aortic Valve: The aortic valve is normal in structure. Aortic valve regurgitation is trivial. Aortic valve sclerosis is present, with no evidence of aortic valve stenosis.  Pulmonic Valve: The pulmonic valve was normal in structure. Pulmonic valve regurgitation is not visualized. Aorta: The ascending aorta was not well visualized. IAS/Shunts: No atrial level shunt detected by color flow Doppler.  LEFT VENTRICLE PLAX 2D LVIDd:         3.50 cm   Diastology LVIDs:         2.20 cm   LV e' medial:    5.93 cm/s LV PW:         1.30 cm   LV E/e' medial:  11.5 LV IVS:        1.30 cm   LV e' lateral:   4.78 cm/s LVOT diam:     1.90 cm   LV E/e' lateral: 14.2 LV SV:         55 LV SV Index:   31 LVOT Area:     2.84 cm  RIGHT VENTRICLE RV S prime:     6.20 cm/s TAPSE (M-mode): 1.3 cm LEFT ATRIUM           Index        RIGHT ATRIUM           Index LA diam:      4.10 cm 2.32 cm/m   RA Area:     11.70 cm LA Vol (A2C): 52.1 ml 29.50 ml/m  RA Volume:   28.00 ml  15.85 ml/m LA Vol (A4C): 43.1 ml 24.40 ml/m  AORTIC VALVE LVOT Vmax:   93.30 cm/s LVOT Vmean:  62.900 cm/s LVOT VTI:    0.195 m  AORTA Ao Root diam: 3.00 cm MITRAL VALVE               TRICUSPID VALVE MV Area (PHT): 2.66 cm    TR Peak grad:   17.6 mmHg MV Decel Time: 286 msec    TR Vmax:        210.00 cm/s MV E velocity: 67.90 cm/s MV A velocity: 82.50 cm/s  SHUNTS MV E/A ratio:  0.82        Systemic VTI:  0.20 m                            Systemic Diam: 1.90 cm Alwyn Pea MD Electronically signed by  Alwyn Pea MD Signature Date/Time: 07/01/2022/11:57:12 AM    Final    DG Chest 2 View  Result Date: 06/30/2022 CLINICAL DATA:  Dizziness, LEFT side chest pain radiating down LEFT arm. EXAM: CHEST - 2 VIEW COMPARISON:  03/25/2022 FINDINGS: Normal heart size post CABG Mediastinal contours and pulmonary vascularity normal. Atherosclerotic calcification aorta. Lungs clear. No pulmonary infiltrate, pleural effusion, or pneumothorax. Osseous demineralization with BILATERAL glenohumeral degenerative changes. IMPRESSION: Post CABG. No acute abnormalities. Aortic Atherosclerosis (ICD10-I70.0). Electronically Signed   By: Ulyses Southward M.D.    On: 06/30/2022 11:33    LHC 07/11/2020 at [facility] Findings:  1. Severe 3v CAD including 70-80% mid LAD, 60-70% diffuse LCx disease  (small vessel) and 90% distal RCA.   2. Patent SVG to RCA, Occluded SVG to OM (likely culprit), Atretic LIMA to  LAD  3. Normal left ventricular filling pressures (LVEDP = 15  mmHg)   Recommendations:  1. Aggressive secondary prevention.  2. Follow up with primary cardiologist.  3. Can consider PCI of LAD in the future if symptoms are not controlled  with medical management   ECHO 07/16/2021 1. Left ventricular ejection fraction, by estimation, is 55 to 60%. The  left ventricle has normal function. The left ventricle has no regional  wall motion abnormalities. Left ventricular diastolic parameters are  consistent with Grade I diastolic  dysfunction (impaired relaxation).   2. Right ventricular systolic function is normal. The right ventricular  size is normal.   3. The mitral valve is normal in structure. Trivial mitral valve  regurgitation.   4. The aortic valve is normal in structure. Aortic valve regurgitation is  not visualized.   TELEMETRY reviewed by me (LT) 07/02/2022 : Sinus rhythm rate 60s-70s  EKG reviewed by me: Sinus rhythm rate 74 bpm with baseline abnormalities with inferior and lateral ST depressions a chronic nonspecific IVCD, essentially unchanged from 03/25/2022.  Data reviewed by me (LT) 07/02/2022: Last outpatient cardiology note, last cardiac cath report from [facility], ED note, admission H&P, nursing notes last 24h vitals tele labs imaging I/O   Principal Problem:   Chest pain Active Problems:   Essential hypertension   Gastroesophageal reflux disease   Mixed hyperlipidemia   Coronary artery disease   Type 2 diabetes mellitus with diabetic polyneuropathy, with long-term current use of insulin   Depression with anxiety   AF (paroxysmal atrial fibrillation)   Esophageal dysphagia   History of coronary artery bypass graft   Obesity  (BMI 30-39.9)   Suprapubic abdominal pain    ASSESSMENT AND PLAN:  Candace [name] is a 72yoF with a PMH of severe 3vCAD s/p CABG x 4 2017 (atretic LIMA-LAD, occluded SVG to OM, patent SVG to RCA, with -70-80% mid LAD, 60-70% diffuse LCx disease (small vessel) and 90% distal RCA by [facility] 07/11/2020), HFpEF (EF 55-60%, G1 DD 07/16/2021), paroxysmal atrial fibrillation (Eliquis), hx TIA, DM2, essential tremor who presented to [facility] ED 06/30/2022 with chest pain that started while she was walking in the grocery store similar to her prior anginal pain.  Cardiology is consulted for further assistance.  # Unstable angina # Severe 3v CAD s/p CABG x 4 (2017) Presents with 8/10 central stabbing chest pain radiating down her left arm with associated nausea and diaphoresis consistent with her prior anginal pain, improvement from an 8/10 to 6/10 after IV Dilaudid and nitroglycerin ointment.  Notably, she was doing well and active without recurrent angina for at least a year, and was recently taken off of  isosorbide last week (which she had been taking for many years) to decrease pill burden.  Fortunately, troponins are negative on initial 2 checks at 10, 9.  Baseline EKG is abnormal with a chronic nonspecific IVCD/incomplete LBBB and inferolateral ST depressions that are essentially unchanged from prior EKG 4 months ago. Offered the patient a Lexiscan Myoview for noninvasive ischemic evaluation, but she politely declined as she experienced severe GI upset/cramping last Myoview 06/2021. Epigastric pain resolved 4/5. CT abd pelvis without acute abnormalities.  Repeat troponin on 4/4 still negative at 12.  -Continue as needed morphine, Nitropaste -Continue Imdur and increase dose back to 60 mg twice daily since BP is better after being off opioids. -Increase ranexa 1000 mg twice daily -Continue aspirin 81 mg daily -Hold Eliquis (last dose morning of 4/3) and continue heparin for stroke prophylaxis while  inpatient -Continue Zetia, Crestor 40 mg daily -ECHO complete resulted with preserved EF 65-70% without regional wall motion abnormalities, essentially unchanged from prior study 06/2021 -The patient strongly wishes to go home today despite having constant 5-6/10 sharp chest pain, appears comfortable and hemodynamically stable.  Discussed at length with Dr. Juliann Paresallwood, he does not recommend LHC at this point, but may consider if pain is refractory to increased doses of antianginals  # Epigastric/RUQ pain Reproducible to palpation, with guarding 4/4,, nontender to palpation today, CT abdomen pelvis as above without acute abnormalities. -Continue Protonix  # HFpEF (EF 55-60%) Euvolemic on exam, chest x-ray clear. -Low BP in the setting of multiple doses of IV opiates overnight on 4/3 so metoprolol, Entresto were held.  Restart these as BP allows. -Continue Jardiance 10 mg daily.  Continue Trulicity SQ injection at regular interval  # Paroxysmal AF In sinus rhythm on telemetry, metoprolol for rate control on Eliquis outpatient with heparin while hospitalized in the event that she needs a procedure  This patient's plan of care was discussed and created with Dr. Juliann Paresallwood and he is in agreement.  Signed: Rebeca AllegraLily Michelle Aidah Forquer , PA-C 07/02/2022, 8:40 AM [facility]

## 2022-07-02 NOTE — Progress Notes (Signed)
ANTICOAGULATION CONSULT NOTE  Pharmacy Consult for heparin infusion Indication: atrial fibrillation  Allergies  Allergen Reactions   Tramadol Other (See Comments)    Causes patient to be off balance and Mental Changes   Lisinopril Cough   Penicillins Swelling, Rash and Other (See Comments)    Did it involve swelling of the face/tongue/throat, SOB, or low BP? Yes Did it involve sudden or severe rash/hives, skin peeling, or any reaction on the inside of your mouth or nose? Yes Did you need to seek medical attention at a hospital or doctor's office? Yes When did it last happen?      15 years If all above answers are "NO", may proceed with cephalosporin use.     Patient Measurements: Height: 5\' 2"  (157.5 cm) Weight: 75.3 kg (166 lb) IBW/kg (Calculated) : 50.1 Heparin Dosing Weight: 66.4 kg  Vital Signs: Temp: 98.5 F (36.9 C) (04/05 1135) Temp Source: Oral (04/05 1135) BP: 92/57 (04/05 1135) Pulse Rate: 76 (04/05 1135)  Labs: Recent Labs    06/30/22 1100 06/30/22 1325 06/30/22 1536 07/01/22 0441 07/01/22 1059 07/01/22 1452 07/01/22 2312 07/02/22 0437 07/02/22 1158 07/02/22 1330  HGB 14.5  --   --  12.6  --   --   --  10.9*  --   --   HCT 44.6  --   --  39.1  --   --   --  33.7*  --   --   PLT 181  --   --  175  --   --   --  137*  --   --   APTT  --   --  32 158*  --  97*  --   --   --   --   HEPARINUNFRC  --    < > 0.35 0.96*  --  0.56 0.65 0.71*  --  0.67  CREATININE 0.76  --   --   --   --   --   --  0.98  --   --   TROPONINIHS 10   < >  --   --  12  --   --   --  7 7   < > = values in this interval not displayed.     Estimated Creatinine Clearance: 50 mL/min (by C-G formula based on SCr of 0.98 mg/dL).   Medical History: Past Medical History:  Diagnosis Date   Allergy    Anemia    Anxiety    Arrhythmia    Arthritis    Atrial fibrillation    CHF (congestive heart failure)    COPD (chronic obstructive pulmonary disease)    Coronary artery disease     Depression    Diabetes mellitus without complication    Dyspnea    doe   Dysrhythmia    GERD (gastroesophageal reflux disease)    Headache    History of hiatal hernia    Hyperlipidemia    Hypertension    Myocardial infarction    2016, 04/2017   Myocardial infarction with cardiac rehabilitation    MI 2016/ CABG 8/17    FINISHED CARDIAC REHAB 3 WEEKS AGO   Panic attack    Pneumonia    Reflux    Stroke 2015   showed up on MRI; no weakness noted   TIA (transient ischemic attack)    Voice tremor     Medications:  Scheduled:   aspirin EC  81 mg Oral Daily   atorvastatin  80 mg  Oral Daily   ezetimibe  10 mg Oral Daily   insulin aspart  0-15 Units Subcutaneous TID WC   insulin aspart  0-5 Units Subcutaneous QHS   insulin glargine-yfgn  12 Units Subcutaneous QHS   isosorbide mononitrate  60 mg Oral BID   pantoprazole  20 mg Oral BID   ranolazine  1,000 mg Oral BID   sertraline  100 mg Oral Daily    Assessment: 72 year old female with history of CAD status post CABG, LIMA to LAD, SVG to PDA and OM, DES to SVG in 2021, and balloon angioplasty at [facility] 2023, anxiety, depression, hyperlipidemia, insulin-dependent diabetes mellitus, GERD, atrial fibrillation, on Eliquis. Last dose apixaban 06/30/22 at 0730.   4/5 1330 HL 0.67    Goal of Therapy:  Heparin level 0.3-0.7 units/ml Monitor platelets by anticoagulation protocol: Yes  Plan: Heparin level is therapeutic. Will continue heparin infusion at 800 units/hr. Recheck heparin level in 8 hours. CBC daily while on heparin.    Paschal Dopp, PharmD, 07/02/2022 2:45 PM

## 2022-07-02 NOTE — Progress Notes (Signed)
ANTICOAGULATION CONSULT NOTE  Pharmacy Consult for initiation and monitoring of heparin infusion Indication: atrial fibrillation  Allergies  Allergen Reactions   Tramadol Other (See Comments)    Causes patient to be off balance and Mental Changes   Lisinopril Cough   Penicillins Swelling, Rash and Other (See Comments)    Did it involve swelling of the face/tongue/throat, SOB, or low BP? Yes Did it involve sudden or severe rash/hives, skin peeling, or any reaction on the inside of your mouth or nose? Yes Did you need to seek medical attention at a hospital or doctor's office? Yes When did it last happen?      15 years If all above answers are "NO", may proceed with cephalosporin use.     Patient Measurements: Height: 5\' 2"  (157.5 cm) Weight: 75.3 kg (166 lb) IBW/kg (Calculated) : 50.1 Heparin Dosing Weight: 66.4 kg  Vital Signs: Temp: 99.6 F (37.6 C) (04/05 0400) BP: 112/62 (04/05 0400) Pulse Rate: 94 (04/05 0400)  Labs: Recent Labs    06/30/22 1100 06/30/22 1325 06/30/22 1536 06/30/22 1536 07/01/22 0441 07/01/22 1059 07/01/22 1452 07/01/22 2312 07/02/22 0437  HGB 14.5  --   --   --  12.6  --   --   --  10.9*  HCT 44.6  --   --   --  39.1  --   --   --  33.7*  PLT 181  --   --   --  175  --   --   --  137*  APTT  --   --  32  --  158*  --  97*  --   --   HEPARINUNFRC  --   --  0.35   < > 0.96*  --  0.56 0.65 0.71*  CREATININE 0.76  --   --   --   --   --   --   --   --   TROPONINIHS 10 8  --   --   --  12  --   --   --    < > = values in this interval not displayed.     Estimated Creatinine Clearance: 61.3 mL/min (by C-G formula based on SCr of 0.76 mg/dL).   Medical History: Past Medical History:  Diagnosis Date   Allergy    Anemia    Anxiety    Arrhythmia    Arthritis    Atrial fibrillation    CHF (congestive heart failure)    COPD (chronic obstructive pulmonary disease)    Coronary artery disease    Depression    Diabetes mellitus without  complication    Dyspnea    doe   Dysrhythmia    GERD (gastroesophageal reflux disease)    Headache    History of hiatal hernia    Hyperlipidemia    Hypertension    Myocardial infarction    2016, 04/2017   Myocardial infarction with cardiac rehabilitation    MI 2016/ CABG 8/17    FINISHED CARDIAC REHAB 3 WEEKS AGO   Panic attack    Pneumonia    Reflux    Stroke 2015   showed up on MRI; no weakness noted   TIA (transient ischemic attack)    Voice tremor     Medications:  Scheduled:   aspirin EC  81 mg Oral Daily   ezetimibe  10 mg Oral Daily   insulin aspart  0-15 Units Subcutaneous TID WC   insulin aspart  0-5 Units  Subcutaneous QHS   insulin glargine-yfgn  12 Units Subcutaneous QHS   isosorbide mononitrate  15 mg Oral Daily   pantoprazole  20 mg Oral BID   ranolazine  500 mg Oral BID   rosuvastatin  40 mg Oral QHS   sertraline  100 mg Oral Daily    Assessment: 72 year old female with history of CAD status post CABG, LIMA to LAD, SVG to PDA and OM, DES to SVG in 2021, and balloon angioplasty at [facility] 2023, anxiety, depression, hyperlipidemia, insulin-dependent diabetes mellitus, GERD, atrial fibrillation, on Eliquis. Last dose apixaban 06/30/22 at 0730   Goal of Therapy:  Heparin level 0.3-0.7 units/ml Monitor platelets by anticoagulation protocol: Yes  Plan: ---Heparin level supratherapeutic ---Decrease heparin infusion to 800 units/hr. ---Recheck HL in 8 hrs after rate change ---Continue to monitor H&H and platelets daily  Otelia Sergeant, PharmD, [facility] 07/02/2022 5:32 AM

## 2022-07-02 NOTE — Progress Notes (Signed)
[place] for initiation and monitoring of heparin infusion Indication: atrial fibrillation  Allergies  Allergen Reactions   Tramadol Other (See Comments)    Causes patient to be off balance and Mental Changes   Lisinopril Cough   Penicillins Swelling, Rash and Other (See Comments)    Did it involve swelling of the face/tongue/throat, SOB, or low BP? Yes Did it involve sudden or severe rash/hives, skin peeling, or any reaction on the inside of your mouth or nose? Yes Did you need to seek medical attention at a hospital or doctor's office? Yes When did it last happen?      15 years If all above answers are "NO", may proceed with cephalosporin use.     Patient Measurements: Height: 5\' 2"  (157.5 cm) Weight: 75.3 kg (166 lb) IBW/kg (Calculated) : 50.1 Heparin Dosing Weight: 66.4 kg  Vital Signs: Temp: 98.4 F (36.9 C) (04/04 1545) BP: 144/71 (04/04 2126) Pulse Rate: 75 (04/04 2126)  Labs: Recent Labs    06/30/22 1100 06/30/22 1325 06/30/22 1536 06/30/22 1536 07/01/22 0441 07/01/22 1059 07/01/22 1452 07/01/22 2312  HGB 14.5  --   --   --  12.6  --   --   --   HCT 44.6  --   --   --  39.1  --   --   --   PLT 181  --   --   --  175  --   --   --   APTT  --   --  32  --  158*  --  97*  --   HEPARINUNFRC  --   --  0.35   < > 0.96*  --  0.56 0.65  CREATININE 0.76  --   --   --   --   --   --   --   TROPONINIHS 10 8  --   --   --  12  --   --    < > = values in this interval not displayed.     Estimated Creatinine Clearance: 61.3 mL/min (by C-G formula based on SCr of 0.76 mg/dL).   Medical History: Past Medical History:  Diagnosis Date   Allergy    Anemia    Anxiety    Arrhythmia    Arthritis    Atrial fibrillation    CHF (congestive heart failure)    COPD (chronic obstructive pulmonary disease)    Coronary artery disease    Depression    Diabetes mellitus without complication    Dyspnea    doe   Dysrhythmia    GERD  (gastroesophageal reflux disease)    Headache    History of hiatal hernia    Hyperlipidemia    Hypertension    Myocardial infarction    2016, 04/2017   Myocardial infarction with cardiac rehabilitation    MI 2016/ CABG 8/17    FINISHED CARDIAC REHAB 3 WEEKS AGO   Panic attack    Pneumonia    Reflux    Stroke 2015   showed up on MRI; no weakness noted   TIA (transient ischemic attack)    Voice tremor     Medications:  Scheduled:   aspirin EC  81 mg Oral Daily   ezetimibe  10 mg Oral Daily   insulin aspart  0-15 Units Subcutaneous TID WC   insulin aspart  0-5 Units Subcutaneous QHS   insulin glargine-yfgn  12 Units Subcutaneous QHS   isosorbide mononitrate  15 mg Oral Daily   pantoprazole  20 mg Oral BID   ranolazine  500 mg Oral BID   rosuvastatin  40 mg Oral QHS   sertraline  100 mg Oral Daily    Assessment: 72 year old female with history of CAD status post CABG, LIMA to LAD, SVG to PDA and OM, DES to SVG in 2021, and balloon angioplasty at [facility] 2023, anxiety, depression, hyperlipidemia, insulin-dependent diabetes mellitus, GERD, atrial fibrillation, on Eliquis. Last dose apixaban 06/30/22 at 0730   Goal of Therapy:  Heparin level 0.3-0.7 units/ml Monitor platelets by anticoagulation protocol: Yes  Plan: ---Heparin level remains therapeutic ---continue heparin infusion at 850 units/hr. ---Recheck HL daily w/ AM labs while therapeutic  ---Continue to monitor H&H and platelets daily  Renda Rolls, PharmD, [facility] 07/02/2022 12:01 AM

## 2023-01-10 ENCOUNTER — Observation Stay: Payer: 59

## 2023-01-10 ENCOUNTER — Encounter: Payer: Self-pay | Admitting: Emergency Medicine

## 2023-01-10 ENCOUNTER — Other Ambulatory Visit: Payer: Self-pay

## 2023-01-10 ENCOUNTER — Emergency Department: Payer: 59

## 2023-01-10 ENCOUNTER — Inpatient Hospital Stay
Admission: EM | Admit: 2023-01-10 | Discharge: 2023-01-17 | DRG: 312 | Disposition: A | Discharge status: Skilled Nursing Facility | Payer: 59 | Attending: Osteopathic Medicine | Admitting: Osteopathic Medicine

## 2023-01-10 DIAGNOSIS — Z6830 Body mass index (BMI) 30.0-30.9, adult: Secondary | ICD-10-CM

## 2023-01-10 DIAGNOSIS — Z951 Presence of aortocoronary bypass graft: Secondary | ICD-10-CM

## 2023-01-10 DIAGNOSIS — Y9301 Activity, walking, marching and hiking: Secondary | ICD-10-CM | POA: Diagnosis present

## 2023-01-10 DIAGNOSIS — Z8673 Personal history of transient ischemic attack (TIA), and cerebral infarction without residual deficits: Secondary | ICD-10-CM

## 2023-01-10 DIAGNOSIS — I25709 Atherosclerosis of coronary artery bypass graft(s), unspecified, with unspecified angina pectoris: Secondary | ICD-10-CM | POA: Diagnosis present

## 2023-01-10 DIAGNOSIS — I5032 Chronic diastolic (congestive) heart failure: Secondary | ICD-10-CM | POA: Diagnosis present

## 2023-01-10 DIAGNOSIS — Z9181 History of falling: Secondary | ICD-10-CM

## 2023-01-10 DIAGNOSIS — J449 Chronic obstructive pulmonary disease, unspecified: Secondary | ICD-10-CM | POA: Diagnosis present

## 2023-01-10 DIAGNOSIS — Z9861 Coronary angioplasty status: Secondary | ICD-10-CM

## 2023-01-10 DIAGNOSIS — E669 Obesity, unspecified: Secondary | ICD-10-CM | POA: Diagnosis present

## 2023-01-10 DIAGNOSIS — Z9851 Tubal ligation status: Secondary | ICD-10-CM

## 2023-01-10 DIAGNOSIS — E782 Mixed hyperlipidemia: Secondary | ICD-10-CM | POA: Diagnosis present

## 2023-01-10 DIAGNOSIS — I152 Hypertension secondary to endocrine disorders: Secondary | ICD-10-CM

## 2023-01-10 DIAGNOSIS — Z7985 Long-term (current) use of injectable non-insulin antidiabetic drugs: Secondary | ICD-10-CM

## 2023-01-10 DIAGNOSIS — M549 Dorsalgia, unspecified: Secondary | ICD-10-CM | POA: Insufficient documentation

## 2023-01-10 DIAGNOSIS — R55 Syncope and collapse: Secondary | ICD-10-CM | POA: Diagnosis not present

## 2023-01-10 DIAGNOSIS — W19XXXA Unspecified fall, initial encounter: Secondary | ICD-10-CM | POA: Diagnosis present

## 2023-01-10 DIAGNOSIS — E785 Hyperlipidemia, unspecified: Secondary | ICD-10-CM | POA: Diagnosis present

## 2023-01-10 DIAGNOSIS — I10 Essential (primary) hypertension: Secondary | ICD-10-CM | POA: Diagnosis present

## 2023-01-10 DIAGNOSIS — G459 Transient cerebral ischemic attack, unspecified: Secondary | ICD-10-CM | POA: Diagnosis present

## 2023-01-10 DIAGNOSIS — I447 Left bundle-branch block, unspecified: Secondary | ICD-10-CM | POA: Diagnosis present

## 2023-01-10 DIAGNOSIS — Z90710 Acquired absence of both cervix and uterus: Secondary | ICD-10-CM

## 2023-01-10 DIAGNOSIS — H538 Other visual disturbances: Secondary | ICD-10-CM | POA: Diagnosis present

## 2023-01-10 DIAGNOSIS — G250 Essential tremor: Secondary | ICD-10-CM | POA: Diagnosis present

## 2023-01-10 DIAGNOSIS — Z751 Person awaiting admission to adequate facility elsewhere: Secondary | ICD-10-CM

## 2023-01-10 DIAGNOSIS — I951 Orthostatic hypotension: Secondary | ICD-10-CM | POA: Diagnosis not present

## 2023-01-10 DIAGNOSIS — Z1152 Encounter for screening for COVID-19: Secondary | ICD-10-CM

## 2023-01-10 DIAGNOSIS — R531 Weakness: Principal | ICD-10-CM

## 2023-01-10 DIAGNOSIS — Z87891 Personal history of nicotine dependence: Secondary | ICD-10-CM

## 2023-01-10 DIAGNOSIS — G20A1 Parkinson's disease without dyskinesia, without mention of fluctuations: Secondary | ICD-10-CM | POA: Diagnosis present

## 2023-01-10 DIAGNOSIS — Z7901 Long term (current) use of anticoagulants: Secondary | ICD-10-CM

## 2023-01-10 DIAGNOSIS — K219 Gastro-esophageal reflux disease without esophagitis: Secondary | ICD-10-CM | POA: Diagnosis present

## 2023-01-10 DIAGNOSIS — Z66 Do not resuscitate: Secondary | ICD-10-CM | POA: Diagnosis present

## 2023-01-10 DIAGNOSIS — I2510 Atherosclerotic heart disease of native coronary artery without angina pectoris: Secondary | ICD-10-CM | POA: Diagnosis present

## 2023-01-10 DIAGNOSIS — G909 Disorder of the autonomic nervous system, unspecified: Secondary | ICD-10-CM | POA: Diagnosis present

## 2023-01-10 DIAGNOSIS — R079 Chest pain, unspecified: Secondary | ICD-10-CM | POA: Diagnosis present

## 2023-01-10 DIAGNOSIS — Z79899 Other long term (current) drug therapy: Secondary | ICD-10-CM

## 2023-01-10 DIAGNOSIS — I480 Paroxysmal atrial fibrillation: Secondary | ICD-10-CM | POA: Diagnosis present

## 2023-01-10 DIAGNOSIS — G4733 Obstructive sleep apnea (adult) (pediatric): Secondary | ICD-10-CM | POA: Diagnosis present

## 2023-01-10 DIAGNOSIS — R251 Tremor, unspecified: Secondary | ICD-10-CM | POA: Diagnosis present

## 2023-01-10 DIAGNOSIS — E1142 Type 2 diabetes mellitus with diabetic polyneuropathy: Secondary | ICD-10-CM

## 2023-01-10 DIAGNOSIS — Z794 Long term (current) use of insulin: Secondary | ICD-10-CM

## 2023-01-10 DIAGNOSIS — I252 Old myocardial infarction: Secondary | ICD-10-CM

## 2023-01-10 DIAGNOSIS — Z8249 Family history of ischemic heart disease and other diseases of the circulatory system: Secondary | ICD-10-CM

## 2023-01-10 DIAGNOSIS — Z7982 Long term (current) use of aspirin: Secondary | ICD-10-CM

## 2023-01-10 DIAGNOSIS — E1159 Type 2 diabetes mellitus with other circulatory complications: Secondary | ICD-10-CM

## 2023-01-10 DIAGNOSIS — Z7984 Long term (current) use of oral hypoglycemic drugs: Secondary | ICD-10-CM

## 2023-01-10 DIAGNOSIS — I110 Hypertensive heart disease with heart failure: Secondary | ICD-10-CM | POA: Diagnosis present

## 2023-01-10 LAB — URINALYSIS, COMPLETE (UACMP) WITH MICROSCOPIC
Bacteria, UA: NONE SEEN
Bilirubin Urine: NEGATIVE
Glucose, UA: >=500 mg/dL — AB
Hgb urine dipstick: NEGATIVE
Ketones, ur: NEGATIVE mg/dL
Leukocytes,Ua: NEGATIVE
Nitrite: NEGATIVE
Protein, ur: NEGATIVE mg/dL
Specific Gravity, Urine: 1.027 (ref 1.005–1.030)
pH: 7 (ref 5.0–8.0)

## 2023-01-10 LAB — COMPREHENSIVE METABOLIC PANEL
ALT: 15 U/L (ref 0–44)
AST: 17 U/L (ref 15–41)
Albumin: 3.9 g/dL (ref 3.5–5.0)
Alkaline Phosphatase: 130 U/L — ABNORMAL HIGH (ref 38–126)
Anion gap: 8 (ref 5–15)
BUN: 17 mg/dL (ref 8–23)
CO2: 27 mmol/L (ref 22–32)
Calcium: 8.7 mg/dL — ABNORMAL LOW (ref 8.9–10.3)
Chloride: 108 mmol/L (ref 98–111)
Creatinine, Ser: 0.86 mg/dL (ref 0.44–1.00)
GFR, Estimated: >60 mL/min (ref >60)
Glucose, Bld: 175 mg/dL — ABNORMAL HIGH (ref 70–99)
Potassium: 4.2 mmol/L (ref 3.5–5.1)
Sodium: 143 mmol/L (ref 135–145)
Total Bilirubin: 0.6 mg/dL (ref 0.3–1.2)
Total Protein: 7.1 g/dL (ref 6.5–8.1)

## 2023-01-10 LAB — CBC
HCT: 38.6 % (ref 36.0–46.0)
Hemoglobin: 12.5 g/dL (ref 12.0–15.0)
MCH: 33.2 pg (ref 26.0–34.0)
MCHC: 32.4 g/dL (ref 30.0–36.0)
MCV: 102.7 fL — ABNORMAL HIGH (ref 80.0–100.0)
Platelets: 182 10*3/uL (ref 150–400)
RBC: 3.76 MIL/uL — ABNORMAL LOW (ref 3.87–5.11)
RDW: 12.3 % (ref 11.5–15.5)
WBC: 8.3 10*3/uL (ref 4.0–10.5)
nRBC: 0 % (ref 0.0–0.2)

## 2023-01-10 LAB — PROCALCITONIN: Procalcitonin: <0.1 ng/mL

## 2023-01-10 LAB — CBG MONITORING, ED
Glucose-Capillary: 160 mg/dL — ABNORMAL HIGH (ref 70–99)
Glucose-Capillary: 117 mg/dL — ABNORMAL HIGH (ref 70–99)

## 2023-01-10 LAB — TROPONIN I (HIGH SENSITIVITY)
Troponin I (High Sensitivity): 7 ng/L (ref <18)
Troponin I (High Sensitivity): 8 ng/L (ref <18)

## 2023-01-10 LAB — SARS CORONAVIRUS 2 BY RT PCR: SARS Coronavirus 2 by RT PCR: NEGATIVE

## 2023-01-10 LAB — CK: Total CK: 55 U/L (ref 38–234)

## 2023-01-10 MED ORDER — INSULIN GLARGINE-YFGN 100 UNIT/ML ~~LOC~~ SOLN
12.0000 [IU] | Freq: Every day | SUBCUTANEOUS | Status: DC
  Administered 2023-01-10 – 2023-01-16 (×7): 12 [IU] via SUBCUTANEOUS
  Filled 2023-01-10 (×8): qty 0.12

## 2023-01-10 MED ORDER — EZETIMIBE 10 MG PO TABS
10.0000 mg | ORAL_TABLET | Freq: Every day | ORAL | Status: DC
  Administered 2023-01-11 – 2023-01-16 (×6): 10 mg via ORAL
  Filled 2023-01-10 (×6): qty 1

## 2023-01-10 MED ORDER — ACETAMINOPHEN 650 MG RE SUPP: 650.0000 mg | RECTAL | Status: DC | PRN

## 2023-01-10 MED ORDER — PANTOPRAZOLE SODIUM 20 MG PO TBEC
20.0000 mg | DELAYED_RELEASE_TABLET | Freq: Two times a day (BID) | ORAL | Status: DC
  Administered 2023-01-10 – 2023-01-17 (×14): 20 mg via ORAL
  Filled 2023-01-10 (×15): qty 1

## 2023-01-10 MED ORDER — NITROGLYCERIN 0.4 MG SL SUBL
0.4000 mg | SUBLINGUAL_TABLET | SUBLINGUAL | Status: DC | PRN
  Administered 2023-01-14: 0.4 mg via SUBLINGUAL
  Filled 2023-01-10: qty 1

## 2023-01-10 MED ORDER — ACETAMINOPHEN 325 MG PO TABS
650.0000 mg | ORAL_TABLET | ORAL | Status: DC | PRN
  Administered 2023-01-10 – 2023-01-16 (×11): 650 mg via ORAL
  Filled 2023-01-10 (×11): qty 2

## 2023-01-10 MED ORDER — LIDOCAINE 5 % EX PTCH: 2.0000 | MEDICATED_PATCH | Freq: Every day | CUTANEOUS | Status: AC | PRN

## 2023-01-10 MED ORDER — ATORVASTATIN CALCIUM 80 MG PO TABS
80.0000 mg | ORAL_TABLET | Freq: Every day | ORAL | Status: DC
  Administered 2023-01-11 – 2023-01-17 (×7): 80 mg via ORAL
  Filled 2023-01-10 (×7): qty 1

## 2023-01-10 MED ORDER — ACETAMINOPHEN 160 MG/5ML PO SOLN: 650.0000 mg | ORAL | Status: DC | PRN

## 2023-01-10 MED ORDER — STROKE: EARLY STAGES OF RECOVERY BOOK: Freq: Once | Status: DC

## 2023-01-10 MED ORDER — OXYCODONE-ACETAMINOPHEN 5-325 MG PO TABS
1.0000 | ORAL_TABLET | Freq: Once | ORAL | Status: AC
  Administered 2023-01-10: 1 via ORAL
  Filled 2023-01-10: qty 1

## 2023-01-10 MED ORDER — INSULIN ASPART 100 UNIT/ML IJ SOLN
0.0000 [IU] | Freq: Three times a day (TID) | INTRAMUSCULAR | Status: DC
  Administered 2023-01-11: 2 [IU] via SUBCUTANEOUS
  Administered 2023-01-11: 3 [IU] via SUBCUTANEOUS
  Administered 2023-01-12: 8 [IU] via SUBCUTANEOUS
  Administered 2023-01-12: 3 [IU] via SUBCUTANEOUS
  Administered 2023-01-12: 2 [IU] via SUBCUTANEOUS
  Administered 2023-01-13: 3 [IU] via SUBCUTANEOUS
  Administered 2023-01-13: 2 [IU] via SUBCUTANEOUS
  Administered 2023-01-14: 5 [IU] via SUBCUTANEOUS
  Administered 2023-01-14: 2 [IU] via SUBCUTANEOUS
  Administered 2023-01-14: 5 [IU] via SUBCUTANEOUS
  Administered 2023-01-15: 2 [IU] via SUBCUTANEOUS
  Administered 2023-01-15 (×2): 5 [IU] via SUBCUTANEOUS
  Administered 2023-01-16: 2 [IU] via SUBCUTANEOUS
  Administered 2023-01-16: 3 [IU] via SUBCUTANEOUS
  Administered 2023-01-17 (×2): 2 [IU] via SUBCUTANEOUS
  Filled 2023-01-10 (×17): qty 1

## 2023-01-10 MED ORDER — EZETIMIBE 10 MG PO TABS: 10.0000 mg | ORAL_TABLET | Freq: Every day | ORAL | Status: DC

## 2023-01-10 MED ORDER — SODIUM CHLORIDE 0.9 % IV BOLUS
1000.0000 mL | Freq: Once | INTRAVENOUS | Status: AC
  Administered 2023-01-10: 1000 mL via INTRAVENOUS

## 2023-01-10 MED ORDER — ONDANSETRON HCL 4 MG/2ML IJ SOLN
4.0000 mg | Freq: Four times a day (QID) | INTRAMUSCULAR | Status: AC | PRN
  Administered 2023-01-10 – 2023-01-13 (×4): 4 mg via INTRAVENOUS
  Filled 2023-01-10 (×4): qty 2

## 2023-01-10 MED ORDER — ALBUTEROL SULFATE (2.5 MG/3ML) 0.083% IN NEBU: 2.5000 mg | INHALATION_SOLUTION | Freq: Four times a day (QID) | RESPIRATORY_TRACT | Status: DC | PRN

## 2023-01-10 MED ORDER — ONDANSETRON 4 MG PO TBDP
4.0000 mg | ORAL_TABLET | Freq: Once | ORAL | Status: AC
  Administered 2023-01-10: 4 mg via ORAL
  Filled 2023-01-10: qty 1

## 2023-01-10 MED ORDER — ALBUTEROL SULFATE HFA 108 (90 BASE) MCG/ACT IN AERS: 2.0000 | INHALATION_SPRAY | Freq: Four times a day (QID) | RESPIRATORY_TRACT | Status: DC | PRN

## 2023-01-10 MED ORDER — SODIUM CHLORIDE 0.9 % IV SOLN: 12.5000 mg | Freq: Four times a day (QID) | INTRAVENOUS | Status: AC | PRN

## 2023-01-10 MED ORDER — ALPRAZOLAM 0.25 MG PO TABS
0.2500 mg | ORAL_TABLET | Freq: Three times a day (TID) | ORAL | Status: DC | PRN
  Administered 2023-01-15: 0.25 mg via ORAL
  Filled 2023-01-10 (×2): qty 1

## 2023-01-10 MED ORDER — SERTRALINE HCL 50 MG PO TABS
100.0000 mg | ORAL_TABLET | Freq: Every day | ORAL | Status: DC
  Administered 2023-01-10 – 2023-01-16 (×7): 100 mg via ORAL
  Filled 2023-01-10 (×7): qty 2

## 2023-01-10 MED ORDER — RANOLAZINE ER 500 MG PO TB12
1000.0000 mg | ORAL_TABLET | Freq: Two times a day (BID) | ORAL | Status: DC
  Administered 2023-01-10 – 2023-01-17 (×14): 1000 mg via ORAL
  Filled 2023-01-10 (×14): qty 2

## 2023-01-10 MED ORDER — ASPIRIN 81 MG PO TBEC
81.0000 mg | DELAYED_RELEASE_TABLET | Freq: Every day | ORAL | Status: DC
  Administered 2023-01-11 – 2023-01-17 (×7): 81 mg via ORAL
  Filled 2023-01-10 (×7): qty 1

## 2023-01-10 MED ORDER — SENNOSIDES-DOCUSATE SODIUM 8.6-50 MG PO TABS: 1.0000 | ORAL_TABLET | Freq: Every evening | ORAL | Status: DC | PRN

## 2023-01-10 MED ORDER — APIXABAN 5 MG PO TABS
5.0000 mg | ORAL_TABLET | Freq: Two times a day (BID) | ORAL | Status: DC
  Administered 2023-01-10 – 2023-01-17 (×14): 5 mg via ORAL
  Filled 2023-01-10 (×14): qty 1

## 2023-01-10 MED ORDER — INSULIN ASPART 100 UNIT/ML IJ SOLN
0.0000 [IU] | Freq: Every day | INTRAMUSCULAR | Status: DC
  Administered 2023-01-14: 2 [IU] via SUBCUTANEOUS
  Filled 2023-01-10: qty 1

## 2023-01-10 MED ORDER — BUTALBITAL-APAP-CAFFEINE 50-325-40 MG PO TABS
1.0000 | ORAL_TABLET | Freq: Four times a day (QID) | ORAL | Status: AC | PRN
  Administered 2023-01-11: 1 via ORAL
  Filled 2023-01-10: qty 1

## 2023-01-10 NOTE — ED Provider Notes (Signed)
[facility] Provider Note    Event Date/Time   First MD Initiated Contact with Patient 01/10/23 0930     (approximate)  History   Chief Complaint: Chest Pain, Weakness, and Fall  HPI  Candace [name] is a 72 y.o. female with a past medical history of anemia, CHF, COPD, diabetes, hypertension, hyperlipidemia, prior CVA, prior MRI, CABG 2017, presents to the emergency department for generalized weakness and a fall.  According to the patient overnight around 4 or 5:00 she was getting up to use the restroom when she became acutely weak and fell to the ground.  Patient describes a generalized weakness.  Patient is not sure if she lost consciousness, is on Eliquis for anticoagulation.  Prior to last night patient states she feels well.  Denies any recent illnesses such as cough congestion or fever.  States she was having some slight chest discomfort at times although none currently.  Vital signs are reassuring.  Physical Exam   Triage Vital Signs: ED Triage Vitals  Encounter Vitals Group     BP 01/10/23 0902 (!) 160/82     Systolic BP Percentile --      Diastolic BP Percentile --      Pulse Rate 01/10/23 0902 78     Resp 01/10/23 0902 17     Temp 01/10/23 0902 98 F (36.7 C)     Temp src --      SpO2 01/10/23 0902 98 %     Weight 01/10/23 0910 165 lb (74.8 kg)     Height 01/10/23 0910 5\' 2"  (1.575 m)     Head Circumference --      Peak Flow --      Pain Score 01/10/23 0909 7     Pain Loc --      Pain Education --      Exclude from Growth Chart --     Most recent vital signs: Vitals:   01/10/23 0902  BP: (!) 160/82  Pulse: 78  Resp: 17  Temp: 98 F (36.7 C)  SpO2: 98%    General: Awake, no distress.  CV:  Good peripheral perfusion.  Regular rate and rhythm  Resp:  Normal effort.  Equal breath sounds bilaterally.  Abd:  No distention.  Soft, nontender.  No rebound or guarding.  ED Results / Procedures / Treatments   EKG  EKG viewed and  interpreted by myself shows a sinus rhythm at 76 bpm with a slightly widened QRS, normal axis, slight QTc prolongation otherwise normal intervals with nonspecific ST changes.  Morphology most consistent with left bundle branch block.  RADIOLOGY  I have reviewed and interpreted CT head images.  No bleed on my evaluation.  Radiology is read the CT scan is negative.   MEDICATIONS ORDERED IN ED: Medications  sodium chloride 0.9 % bolus 1,000 mL (has no administration in time range)  ondansetron (ZOFRAN-ODT) disintegrating tablet 4 mg (4 mg Oral Given 01/10/23 1610)     IMPRESSION / MDM / ASSESSMENT AND PLAN / ED COURSE  I reviewed the triage vital signs and the nursing notes.  Patient's presentation is most consistent with acute presentation with potential threat to life or bodily function.  Patient presents to the emergency department for onset of generalized weakness and a fall.  Patient does have somewhat dry appearing mucous membranes we will IV hydrate.  We will check labs including CBC chemistry cardiac enzyme and a CK given her approximate 3-hour of downtime.  As  the patient is on Eliquis and had a fall we will obtain CT imaging of the head rule out intracranial hemorrhage or other acute abnormality.  We will also obtain a COVID swab as well as a urine sample to rule out infectious etiology.  We will IV hydrate while awaiting lab results.  Will continue to closely monitor.  Patient agreeable to plan of care and workup.  Patient CT scan of the head is negative.  Lab work is so far reassuring with a negative troponin negative COVID test and reassuring chemistry including normal anion gap, reassuring CBC, normal CK.  Patient is completing her IV fluids, urinalysis is pending.  If urinalysis shows a urinary tract infection we will likely treat with IV antibiotics and possibly admit if she has ongoing weakness.  If the urinalysis is normal anticipate ambulation trial if the patient is able to  safely ambulate she could be discharged otherwise possibly admitted.  Patient care signed out to oncoming provider.  FINAL CLINICAL IMPRESSION(S) / ED DIAGNOSES   Weakness Fall  Note:  This document was prepared using Dragon voice recognition software and may include unintentional dictation errors.   Minna Antis, MD 01/10/23 1539

## 2023-01-10 NOTE — Assessment & Plan Note (Addendum)
Home atorvastatin 80 mg nightly, aspirin 81 mg daily, apixaban 5 mg p.o. twice daily were resumed on admission Imdur not resumed on admission due to several blood pressure reading of low normal tensive

## 2023-01-10 NOTE — H&P (Signed)
History and Physical   Candace [name]:[id number] DOB: 09/24/1950 DOA: 01/10/2023  PCP: Bosie Clos, MD  Outpatient Specialists: Dr. Olevia Perches clinic cardiology Patient coming from: Home  I have personally briefly reviewed patient's old medical records in [facility] EMR.  Chief Concern: Falling, chest pain  HPI: [name] is a 72 year old female with history of hyperlipidemia, insulin-dependent diabetes mellitus, hypertension, GERD, depression, anxiety, on anticoagulation, CAD status post CABG LIMA to LAD, SVG to PDA and OM, DES to SVG in 2021, and balloon angioplasty at outside hospital in 2023, history of CVA, paroxysmal atrial fibrillation, on Eliquis, who presents emergency department who presents emergency department for chief concerns of chest pain and increased falling.  Vitals in the ED showed temperature of 98, respiration rate of 19, heart rate 78, blood pressure 160/82, SpO2 of 98% on room air.  Serum sodium is 143, potassium 4.2, chloride 108, bicarb 27, BUN of 17, creatinine of 0.86, EGFR greater than 60, nonfasting blood glucose 175, WBC 8.3, hemoglobin 12.5, platelets of 182.  COVID PCR was negative.  CK was 55.  High sensitive troponin was 7 and on repeat was 8.  UA was negative for leukocytes and nitrates.  ED treatment: Ondansetron 4 mg p.o. one-time dose, oxycodone-acetaminophen 5-325 mg p.o. one-time dose ordered.  Sodium chloride 1 L bolus. ------------------------------ At bedside, patient was able to tell me her name, age, location, current calendar year.  She reports that she felt her legs buckling and blurry vision with diaphoresis at approximately 4 or 5 AM this morning when getting up to use the restroom.  She fell to the ground in the next thing she knew she was laying there for more than 3 hours.  The only phone number she could call was 911.  She could not remember anything else.  She denies trauma to her person otherwise. She is  unsure if she lost consciousness.  She reports persistent squeezing sensation in her chest but right now is very mild.  However initially it was more intense.  She states this feels similar to prior episodes of chest pain in April.  Social history: She is at home on her own.  She is a former tobacco user, quitting tobacco use more than 20 years ago.  She denies EtOH and recreational drug use.  She is retired.  ROS: Constitutional: no weight change, no fever ENT/Mouth: no sore throat, no rhinorrhea Eyes: no eye pain, no vision changes Cardiovascular: + chest pain, no dyspnea,  no edema, no palpitations Respiratory: no cough, no sputum, no wheezing Gastrointestinal: no nausea, no vomiting, no diarrhea, no constipation Genitourinary: no urinary incontinence, no dysuria, no hematuria Musculoskeletal: no arthralgias, no myalgias Skin: no skin lesions, no pruritus, Neuro: + weakness, no loss of consciousness, no syncope Psych: no anxiety, no depression, no decrease appetite Heme/Lymph: no bruising, no bleeding  ED Course: Discussed with emergency medicine provider, patient requiring hospitalization for chief concerns of near syncope, chest pain.  Assessment/Plan  Principal Problem:   Syncope Active Problems:   Chest pain   Type 2 diabetes mellitus with diabetic polyneuropathy, with long-term current use of insulin (HCC)   Paroxysmal atrial fibrillation (HCC)   Essential hypertension   Gastroesophageal reflux disease   Mixed hyperlipidemia   Coronary artery disease   S/P CABG x 4   Tremors of nervous system   Mild obstructive sleep apnea   COPD suggested by initial evaluation (HCC)   TIA (transient ischemic attack)  Obesity (BMI 30-39.9)   Back pain   Assessment and Plan:  * Syncope Etiology workup in progress MRI of the brain without contrast has been ordered by EDP Check port CXR and procalcitonin If MRI of the brain is negative, patient would benefit from cardiac workup  including cardiology evaluation for possible stress test and consideration for a home Holter monitor Fall precaution Admit to telemetry cardiac, observation  Chest pain High sensitive troponin were negative x 2. Pending MRI, a.m. team can consider cardiology consultation for stress test evaluation No complete echo ordered on admission as patient received to complete echo on 07/22/2022: Was read as EF estimated ejection fraction 65 to 70%.  Grade 1 diastolic dysfunction. Fall precaution  Type 2 diabetes mellitus with diabetic polyneuropathy, with long-term current use of insulin (HCC) Patient takes Jardiance 10 mg daily, Trulicity 1.5 mg subcutaneous injection weekly, Lantus 100 units subcutaneous nightly Insulin SSI with at bedtime coverage ordered Goal inpatient blood glucose levels 140-180  Paroxysmal atrial fibrillation (HCC) Patient is on Eliquis 5 mg p.o. twice daily, this has been resumed on admission  Essential hypertension Home Imdur, propranolol 60 mg daily, metoprolol tartrate 50 mg p.o. twice daily were not resumed on admission due to several blood pressure reading of low to normal blood pressure  Back pain Query secondary to fall Approximately T9-T11 point tenderness, L2-L3 tenderness, and sacral tenderness CT thoracic spine without contrast and CT lumbar spine without contrast, x-ray of sacrum and coccyx ordered Lidocaine patch, 2 patch to 12 hours daily as needed for musculoskeletal pain, 5 days ordered  Mild obstructive sleep apnea CPAP nightly ordered  Tremors of nervous system Patient is currently being worked up outpatient for possible Parkinson's Per daughter at bedside, patient's tremors and speech pattern has progressively worsened over the past several months  S/P CABG x 4 With anginal symptoms Home Ranexa 1000 mg p.o. twice daily resumed Atorvastatin 80 mg daily, ezetimibe 10 mg daily, aspirin 81 mg daily, Eliquis 5 mg p.o. twice daily were resumed on  admission  Coronary artery disease Home atorvastatin 80 mg nightly, aspirin 81 mg daily, apixaban 5 mg p.o. twice daily were resumed on admission Imdur not resumed on admission due to several blood pressure reading of low normal tensive  Mixed hyperlipidemia Atorvastatin 80 mg daily  Gastroesophageal reflux disease Protonix 20 mg p.o. twice daily resumed  Chart reviewed.   DVT prophylaxis: Apixaban 5 mg p.o. twice daily Code Status: MOST in ACP reviewed states patient is DNR.  However at bedside, patient states that she would like to have full resuscitative measures 1 time only.  Full code ordered Diet: Heart healthy Family Communication: Updated daughter, Maralyn Sago at bedside with patient's permission Disposition Plan: pending clinical course Consults called: none at this time Admission status: Telemetry cardiac, observation  Past Medical History:  Diagnosis Date   Allergy    Anemia    Anxiety    Arrhythmia    Arthritis    Atrial fibrillation (HCC)    CHF (congestive heart failure) (HCC)    COPD (chronic obstructive pulmonary disease) (HCC)    Coronary artery disease    Depression    Diabetes mellitus without complication (HCC)    Dyspnea    doe   Dysrhythmia    GERD (gastroesophageal reflux disease)    Headache    History of hiatal hernia    Hyperlipidemia    Hypertension    Myocardial infarction (HCC)    2016, 04/2017   Myocardial infarction with cardiac  rehabilitation [facility])    MI 2016/ CABG 8/17    FINISHED CARDIAC REHAB 3 WEEKS AGO   Panic attack    Pneumonia    Reflux    Stroke (HCC) 2015   showed up on MRI; no weakness noted   TIA (transient ischemic attack)    Voice tremor    Past Surgical History:  Procedure Laterality Date   ABDOMINAL HYSTERECTOMY     APPENDECTOMY  1975   ARTERY BIOPSY Right 04/26/2016   Procedure: BIOPSY TEMPORAL ARTERY;  Surgeon: Vernie Murders, MD;  Location: [facility] ORS;  Service: ENT;  Laterality: Right;   CARDIAC CATHETERIZATION N/A  11/06/2015   Procedure: Left Heart Cath and Coronary Angiography;  Surgeon: Lamar Blinks, MD;  Location: [facility] INVASIVE CV LAB;  Service: Cardiovascular;  Laterality: N/A;   CESAREAN SECTION     COLONOSCOPY  2015   COLONOSCOPY WITH PROPOFOL N/A 11/03/2018   Procedure: COLONOSCOPY WITH PROPOFOL;  Surgeon: Pasty Spillers, MD;  Location: [facility] ENDOSCOPY;  Service: Endoscopy;  Laterality: N/A;   CORONARY ANGIOPLASTY  04/2017   [facility]   CORONARY ARTERY BYPASS GRAFT N/A 11/10/2015   Procedure: CORONARY ARTERY BYPASS GRAFTING (CABG), ON PUMP, TIMES FOUR, USING LEFT INTERNAL MAMMARY ARTERY, BILATERAL GREATER SAPHENOUS VEINS HARVESTED ENDOSCOPICALLY;  Surgeon: Delight Ovens, MD;  Location[facility];  Service: Open Heart Surgery;  Laterality: N/A;  LIMA-LAD; SEQ SVG-OM1-OM2; SVG-PL   CORONARY STENT INTERVENTION N/A 08/05/2016   Procedure: Coronary Stent Intervention;  Surgeon: Marcina Millard, MD;  Location: [facility] INVASIVE CV LAB;  Service: Cardiovascular;  Laterality: N/A;   ESOPHAGOGASTRODUODENOSCOPY (EGD) WITH PROPOFOL N/A 11/03/2018   Procedure: ESOPHAGOGASTRODUODENOSCOPY (EGD) WITH PROPOFOL;  Surgeon: Pasty Spillers, MD;  Location: [facility] ENDOSCOPY;  Service: Endoscopy;  Laterality: N/A;   HYSTERECTOMY ABDOMINAL WITH SALPINGO-OOPHORECTOMY Bilateral 08/15/2017   Procedure: HYSTERECTOMY ABDOMINAL WITH BILATERAL SALPINGO-OOPHORECTOMY;  Surgeon: Hildred Laser, MD;  Location: [facility] ORS;  Service: Gynecology;  Laterality: Bilateral;   LEFT HEART CATH AND CORONARY ANGIOGRAPHY N/A 08/05/2016   Procedure: Left Heart Cath and Coronary Angiography;  Surgeon: Marcina Millard, MD;  Location: [facility] INVASIVE CV LAB;  Service: Cardiovascular;  Laterality: N/A;   LEFT HEART CATH AND CORS/GRAFTS ANGIOGRAPHY N/A 09/19/2018   Procedure: LEFT HEART CATH AND CORS/GRAFTS ANGIOGRAPHY;  Surgeon: Lamar Blinks, MD;  Location: [facility] INVASIVE CV LAB;  Service: Cardiovascular;  Laterality: N/A;   LEFT  HEART CATH AND CORS/GRAFTS ANGIOGRAPHY N/A 08/30/2019   Procedure: LEFT HEART CATH AND CORS/GRAFTS ANGIOGRAPHY;  Surgeon: Dalia Heading, MD;  Location: [facility] INVASIVE CV LAB;  Service: Cardiovascular;  Laterality: N/A;   OOPHORECTOMY     TEE WITHOUT CARDIOVERSION N/A 11/10/2015   Procedure: TRANSESOPHAGEAL ECHOCARDIOGRAM (TEE);  Surgeon: Delight Ovens, MD;  Location: [facility] OR;  Service: Open Heart Surgery;  Laterality: N/A;   TUBAL LIGATION     Social History:  reports that she quit smoking about 21 years ago. Her smoking use included cigarettes. She has never used smokeless tobacco. She reports that she does not drink alcohol and does not use drugs.  Allergies  Allergen Reactions   Tramadol Other (See Comments)    Causes patient to be off balance and Mental Changes   Lisinopril Cough   Penicillins Swelling, Rash and Other (See Comments)    Did it involve swelling of the face/tongue/throat, SOB, or low BP? Yes Did it involve sudden or severe rash/hives, skin peeling, or any reaction on the inside of your mouth or nose? Yes Did you  need to seek medical attention at a hospital or doctor's office? Yes When did it last happen?      15 years If all above answers are "NO", may proceed with cephalosporin use.    Family History  Problem Relation Age of Onset   Cancer Father    Hypertension Father    Heart disease Father    Asthma Father    Cancer Mother    Hypertension Mother    Pancreatic cancer Mother 23   Cancer Sister    Breast cancer Sister 59   Breast cancer Sister 73   Lung cancer Brother    Pancreatic cancer Sister 65   Cancer Sister    Family history: Family history reviewed and not pertinent.  Prior to Admission medications   Medication Sig Start Date End Date Taking? Authorizing Provider  acetaminophen (TYLENOL) 325 MG tablet Take 650 mg by mouth every 6 (six) hours as needed for moderate pain or headache.     [provider]  albuterol (VENTOLIN HFA) 108 (90  Base) MCG/ACT inhaler Inhale 2 puffs into the lungs every 6 (six) hours as needed for wheezing or shortness of breath. 01/07/21   Bosie Clos, MD  aspirin EC 81 MG tablet Take 81 mg by mouth daily. Swallow whole.    [provider]  atorvastatin (LIPITOR) 80 MG tablet Take 1 tablet (80 mg total) by mouth daily. 07/03/22   Sunnie Nielsen, DO  Blood Glucose Monitoring Suppl [facility] VERIO) w/Device KIT Use daily to check blood sugar 03/05/21   Bacigalupo, Marzella Schlein, MD  COMFORT EZ PEN NEEDLES 32G X 4 MM MISC Use to inject insulin daily 01/04/22   Jacky Kindle, FNP  Dulaglutide (TRULICITY) 1.5 MG/0.5ML SOPN Inject 1.5 mg into the skin once a week. 01/28/22   Drubel, Lillia Abed, PA-C  ELIQUIS 5 MG TABS tablet Take 5 mg by mouth 2 (two) times daily. 03/26/20   [provider]  ezetimibe (ZETIA) 10 MG tablet Take 1 tablet (10 mg total) by mouth daily. 01/19/22   Alfredia Ferguson, PA-C  glucose blood test strip Use to check blood sugars daily as instructed 03/02/22   Simmons-Robinson, Makiera, MD  insulin glargine (LANTUS SOLOSTAR) 100 UNIT/ML Solostar Pen Inject 12 Units into the skin at bedtime. Patient taking differently: Inject 12 Units into the skin at bedtime. 08/21/21   Lurene Shadow, MD  isosorbide mononitrate (IMDUR) 60 MG 24 hr tablet Take 1 tablet (60 mg total) by mouth daily. 07/02/22   Sunnie Nielsen, DO  Lancets [facility] DELICA PLUS [place]) MISC USE UP TO 4 TIMES DAILY AS DIRECTED 07/10/19   Bosie Clos, MD  loperamide (IMODIUM) 2 MG capsule Take 2 mg by mouth daily as needed for diarrhea or loose stools.    [provider]  nitroGLYCERIN (NITROSTAT) 0.4 MG SL tablet Place 1 tablet (0.4 mg total) under the tongue every 5 (five) minutes as needed for chest pain. 07/27/16   Adrian Saran, MD  nystatin (NYSTATIN) powder Apply 1 application topically daily. Patient taking differently: Apply 1 application  topically as needed. 02/14/20   Hildred Laser, MD   ondansetron (ZOFRAN-ODT) 8 MG disintegrating tablet TAKE ONE TABLET BY MOUTH EVERY 8 HOURS AS NEEDED FOR NAUSEA AND VOMITING 10/14/21   Bosie Clos, MD  pantoprazole (PROTONIX) 20 MG tablet Take 1 tablet (20 mg total) by mouth 2 (two) times daily. 05/15/21   Bosie Clos, MD  ranolazine (RANEXA) 1000 MG SR tablet Take 1 tablet (1,000  mg total) by mouth 2 (two) times daily. 07/02/22   Sunnie Nielsen, DO  sertraline (ZOLOFT) 100 MG tablet Take 1 tablet (100 mg total) by mouth daily. 01/19/22   Alfredia Ferguson, PA-C  cetirizine (ZYRTEC) 5 MG tablet Take 2 tablets (10 mg total) by mouth daily. 12/19/18 02/06/20  Bosie Clos, MD   Physical Exam: Vitals:   01/10/23 1500 01/10/23 1600 01/10/23 1930 01/10/23 1950  BP: (!) 162/67 (!) 177/86 133/78   Pulse: 68 73 69   Resp:   13   Temp:    98.5 F (36.9 C)  TempSrc:    Oral  SpO2: 99% 95% 98%   Weight:      Height:       Constitutional: appears age-appropriate, frail, NAD, calm Eyes: PERRL, lids and conjunctivae normal ENMT: Mucous membranes are moist. Posterior pharynx clear of any exudate or lesions. Age-appropriate dentition. Hearing appropriate Neck: normal, supple, no masses, no thyromegaly Respiratory: clear to auscultation bilaterally, no wheezing, no crackles. Normal respiratory effort. No accessory muscle use.  Cardiovascular: Regular rate and rhythm, no murmurs / rubs / gallops. No extremity edema. 2+ pedal pulses. No carotid bruits.  Abdomen: Obese abdomen, no tenderness, no masses palpated, no hepatosplenomegaly. Bowel sounds positive.  Musculoskeletal: no clubbing / cyanosis. No joint deformity upper and lower extremities. Good ROM, no contractures, no atrophy. Normal muscle tone.  Skin: no rashes, lesions, ulcers. No induration Neurologic: Sensation intact. Strength 5/5 in all 4.  Psychiatric: Normal judgment and insight. Alert and oriented x 3. Normal mood.   EKG: independently reviewed, showing sinus rhythm  with rate of 76, QTc 508  Chest x-ray on Admission: I personally reviewed and is negative for x-ray evidence of acute cardiopulmonary process.  CT Head Wo Contrast  Result Date: 01/10/2023 CLINICAL DATA:  72 year old female with chest pain, headache, fall, found down. EXAM: CT HEAD WITHOUT CONTRAST TECHNIQUE: Contiguous axial images were obtained from the base of the skull through the vertex without intravenous contrast. RADIATION DOSE REDUCTION: This exam was performed according to the departmental dose-optimization program which includes automated exposure control, adjustment of the mA and/or kV according to patient size and/or use of iterative reconstruction technique. COMPARISON:  Brain MRI 02/11/2022.  Head CT 01/01/2022. FINDINGS: Brain: Cerebral volume is within normal limits for age. No midline shift, ventriculomegaly, mass effect, evidence of mass lesion, intracranial hemorrhage or evidence of cortically based acute infarction. Chronic bilateral basal ganglia lacunar infarcts. Chronic encephalomalacia in the body of the corpus callosum. Stable gray-white matter differentiation throughout the brain. Vascular: Calcified atherosclerosis at the skull base. No suspicious intracranial vascular hyperdensity. Skull: Stable, intact. Sinuses/Orbits: Visualized paranasal sinuses and mastoids are stable and well aerated. Other: No orbit or scalp soft tissue injury identified. IMPRESSION: 1. No acute intracranial abnormality or acute traumatic injury identified. 2. Stable non contrast CT appearance of advanced chronic small vessel disease. Electronically Signed   By: Odessa Fleming M.D.   On: 01/10/2023 11:37    Labs on Admission: I have personally reviewed following labs  CBC: Recent Labs  Lab 01/10/23 0933  WBC 8.3  HGB 12.5  HCT 38.6  MCV 102.7*  PLT 182   Basic Metabolic Panel: Recent Labs  Lab 01/10/23 0933  NA 143  K 4.2  CL 108  CO2 27  GLUCOSE 175*  BUN 17  CREATININE 0.86  CALCIUM 8.7*    GFR: Estimated Creatinine Clearance: 56.8 mL/min (by C-G formula based on SCr of 0.86 mg/dL).  Liver Function Tests:  Recent Labs  Lab 01/10/23 0933  AST 17  ALT 15  ALKPHOS 130*  BILITOT 0.6  PROT 7.1  ALBUMIN 3.9   Cardiac Enzymes: Recent Labs  Lab 01/10/23 0933  CKTOTAL 55   CBG: Recent Labs  Lab 01/10/23 0912  GLUCAP 160*   Urine analysis:    Component Value Date/Time   COLORURINE YELLOW (A) 01/10/2023 1620   APPEARANCEUR CLEAR (A) 01/10/2023 1620   APPEARANCEUR Clear 12/24/2014 0905   LABSPEC 1.027 01/10/2023 1620   LABSPEC 1.030 09/14/2013 2123   PHURINE 7.0 01/10/2023 1620   GLUCOSEU >=500 (A) 01/10/2023 1620   GLUCOSEU Negative 09/14/2013 2123   HGBUR NEGATIVE 01/10/2023 1620   BILIRUBINUR NEGATIVE 01/10/2023 1620   BILIRUBINUR Negative 02/06/2020 1014   BILIRUBINUR Negative 12/24/2014 0905   BILIRUBINUR Negative 09/14/2013 2123   KETONESUR NEGATIVE 01/10/2023 1620   PROTEINUR NEGATIVE 01/10/2023 1620   UROBILINOGEN 0.2 02/06/2020 1014   NITRITE NEGATIVE 01/10/2023 1620   LEUKOCYTESUR NEGATIVE 01/10/2023 1620   LEUKOCYTESUR 3+ 09/14/2013 2123   This document was prepared using Dragon Voice Recognition software and may include unintentional dictation errors.  Dr. Sedalia Muta [facility]  If 7PM-7AM, please contact overnight-coverage provider If 7AM-7PM, please contact day attending provider [email or web address]  01/10/2023, 9:41 PM

## 2023-01-10 NOTE — Assessment & Plan Note (Deleted)
MOST form and ACP reviewed

## 2023-01-10 NOTE — ED Notes (Signed)
Pt in mri

## 2023-01-10 NOTE — ED Notes (Signed)
Resumed care from Tiffany rn.  Pt alert.  Family with pt.  Iv fluids infused.  Pt drinking ginger ale  pt waiting on urine sample.

## 2023-01-10 NOTE — Assessment & Plan Note (Signed)
With anginal symptoms Home Ranexa 1000 mg p.o. twice daily resumed Atorvastatin 80 mg daily, ezetimibe 10 mg daily, aspirin 81 mg daily, Eliquis 5 mg p.o. twice daily were resumed on admission

## 2023-01-10 NOTE — ED Notes (Signed)
Pt unable to walk with without 2 person assist.  Pt denies pain.  Ua sent to lab.  Iv in place.

## 2023-01-10 NOTE — ED Provider Notes (Signed)
  Physical Exam  BP (!) 177/86   Pulse 73   Temp 98.1 F (36.7 C) (Oral)   Resp 18   Ht 5\' 2"  (1.575 m)   Wt 74.8 kg   SpO2 95%   BMI 30.18 kg/m    Procedures  Procedures  ED Course / MDM   Clinical Course as of 01/10/23 1801  Mon Jan 10, 2023  1637 Urinalysis, Complete w Microscopic -Urine, Clean Catch(!) No UTI [DW]  1737 Patient physically unable to stand with 2+ assist.  Intermittently having blurry vision symptoms. [DW]    Clinical Course User Index [DW] Janith Lima, MD   Medical Decision Making Amount and/or Complexity of Data Reviewed Labs: ordered. Decision-making details documented in ED Course. Radiology: ordered.  Risk Prescription drug management. Decision regarding hospitalization.   Received patient in signout.  72 year old female presenting today for fall with questionable syncope and loss of consciousness.  Vital signs were stable and physical exam largely unremarkable with no focal neurological deficits.  Patient still with profound weakness on exam.  Initial laboratory workup was largely reassuring along with negative CT head.  Patient signed out pending results of urinalysis, fluid hydration and reassessment.  UA shows no evidence of UTI.  Patient was given 1 L of fluids.  Attempted to stand her and she required 2+ assistance just to be able to sit on the side of the bed.  Was not able to stand.  Further discussion around event last night showing concern for possible syncope with loss of consciousness as a result of her fall along with chest pain.  Separately, also noted some blurred vision and prior history of TIAs which presented similar.  MRI brain was ordered and patient was admitted to hospitalist for further evaluation of potential TIA and syncope.    Janith Lima, MD 01/10/23 [phone]

## 2023-01-10 NOTE — Hospital Course (Addendum)
[name] is a 72 year old female with history of hyperlipidemia, insulin-dependent diabetes mellitus, hypertension, GERD, depression, anxiety, on anticoagulation, CAD status post CABG LIMA to LAD, SVG to PDA and OM, DES to SVG in 2021, and balloon angioplasty at outside hospital in 2023, history of CVA, paroxysmal atrial fibrillation, on Eliquis, who presents emergency department who presents emergency department for chief concerns of chest pain and increased falling.  Vitals in the ED showed temperature of 98, respiration rate of 19, heart rate 78, blood pressure 160/82, SpO2 of 98% on room air.  Serum sodium is 143, potassium 4.2, chloride 108, bicarb 27, BUN of 17, creatinine of 0.86, EGFR greater than 60, nonfasting blood glucose 175, WBC 8.3, hemoglobin 12.5, platelets of 182.  COVID PCR was negative.  CK was 55.  High sensitive troponin was 7 and on repeat was 8.  UA was negative for leukocytes and nitrates.  ED treatment: Ondansetron 4 mg p.o. one-time dose, oxycodone-acetaminophen 5-325 mg p.o. one-time dose ordered.  Sodium chloride 1 L bolus.

## 2023-01-10 NOTE — Assessment & Plan Note (Addendum)
Patient takes Jardiance 10 mg daily, Trulicity 1.5 mg subcutaneous injection weekly, Lantus 100 units subcutaneous nightly Insulin SSI with at bedtime coverage ordered Goal inpatient blood glucose levels 140-180

## 2023-01-10 NOTE — Assessment & Plan Note (Addendum)
High sensitive troponin were negative x 2. Pending MRI, a.m. team can consider cardiology consultation for stress test evaluation No complete echo ordered on admission as patient received to complete echo on 07/22/2022: Was read as EF estimated ejection fraction 65 to 70%.  Grade 1 diastolic dysfunction. Fall precaution

## 2023-01-10 NOTE — Assessment & Plan Note (Addendum)
Query secondary to fall Approximately T9-T11 point tenderness, L2-L3 tenderness, and sacral tenderness CT thoracic spine without contrast and CT lumbar spine without contrast, x-ray of sacrum and coccyx ordered Lidocaine patch, 2 patch to 12 hours daily as needed for musculoskeletal pain, 5 days ordered

## 2023-01-10 NOTE — Assessment & Plan Note (Signed)
Home Imdur, propranolol 60 mg daily, metoprolol tartrate 50 mg p.o. twice daily were not resumed on admission due to several blood pressure reading of low to normal blood pressure

## 2023-01-10 NOTE — Assessment & Plan Note (Addendum)
Patient is on Eliquis 5 mg p.o. twice daily, this has been resumed on admission

## 2023-01-10 NOTE — Assessment & Plan Note (Signed)
Atorvastatin 80 mg daily

## 2023-01-10 NOTE — Assessment & Plan Note (Signed)
Protonix 20 mg p.o. twice daily resumed

## 2023-01-10 NOTE — Assessment & Plan Note (Signed)
Patient is currently being worked up outpatient for possible Parkinson's Per daughter at bedside, patient's tremors and speech pattern has progressively worsened over the past several months

## 2023-01-10 NOTE — ED Notes (Signed)
Md at bedside with family.

## 2023-01-10 NOTE — Assessment & Plan Note (Addendum)
Etiology workup in progress MRI of the brain without contrast has been ordered by EDP Check port CXR and procalcitonin If MRI of the brain is negative, patient would benefit from cardiac workup including cardiology evaluation for possible stress test and consideration for a home Holter monitor Fall precaution Admit to telemetry cardiac, observation

## 2023-01-10 NOTE — ED Triage Notes (Addendum)
Patient in via [facility] from home, complaints of generalized chest pain since last night.  Reports having fall this morning, on floor x approximately 3 hours.  EMS reports noteable generalized weakness.     Patient reports going to bathroom this morning but "My legs gave out on me and I ended up in the floor."  A/Ox4 upon arrival, but with slurred speech.    Pt also reports lower back pain since the fall, in addition to headache, generalized abdominal pain and nausea.  During triage, patient states, I had some blurred vision and then I just couldn't see.  When inquiring about LOC, patient unable to recall.

## 2023-01-10 NOTE — Assessment & Plan Note (Signed)
-  CPAP nightly ordered

## 2023-01-11 DIAGNOSIS — Z794 Long term (current) use of insulin: Secondary | ICD-10-CM | POA: Diagnosis not present

## 2023-01-11 DIAGNOSIS — I480 Paroxysmal atrial fibrillation: Secondary | ICD-10-CM | POA: Diagnosis present

## 2023-01-11 DIAGNOSIS — Z7901 Long term (current) use of anticoagulants: Secondary | ICD-10-CM | POA: Diagnosis not present

## 2023-01-11 DIAGNOSIS — I5032 Chronic diastolic (congestive) heart failure: Secondary | ICD-10-CM | POA: Diagnosis present

## 2023-01-11 DIAGNOSIS — Z66 Do not resuscitate: Secondary | ICD-10-CM | POA: Diagnosis present

## 2023-01-11 DIAGNOSIS — J449 Chronic obstructive pulmonary disease, unspecified: Secondary | ICD-10-CM | POA: Diagnosis present

## 2023-01-11 DIAGNOSIS — Z79899 Other long term (current) drug therapy: Secondary | ICD-10-CM | POA: Diagnosis not present

## 2023-01-11 DIAGNOSIS — I110 Hypertensive heart disease with heart failure: Secondary | ICD-10-CM | POA: Diagnosis present

## 2023-01-11 DIAGNOSIS — R531 Weakness: Secondary | ICD-10-CM | POA: Diagnosis present

## 2023-01-11 DIAGNOSIS — E669 Obesity, unspecified: Secondary | ICD-10-CM | POA: Diagnosis present

## 2023-01-11 DIAGNOSIS — M549 Dorsalgia, unspecified: Secondary | ICD-10-CM | POA: Diagnosis present

## 2023-01-11 DIAGNOSIS — G4733 Obstructive sleep apnea (adult) (pediatric): Secondary | ICD-10-CM | POA: Diagnosis present

## 2023-01-11 DIAGNOSIS — H538 Other visual disturbances: Secondary | ICD-10-CM | POA: Diagnosis present

## 2023-01-11 DIAGNOSIS — R55 Syncope and collapse: Secondary | ICD-10-CM | POA: Diagnosis not present

## 2023-01-11 DIAGNOSIS — E1142 Type 2 diabetes mellitus with diabetic polyneuropathy: Secondary | ICD-10-CM | POA: Diagnosis present

## 2023-01-11 DIAGNOSIS — Y9301 Activity, walking, marching and hiking: Secondary | ICD-10-CM | POA: Diagnosis present

## 2023-01-11 DIAGNOSIS — G909 Disorder of the autonomic nervous system, unspecified: Secondary | ICD-10-CM | POA: Diagnosis present

## 2023-01-11 DIAGNOSIS — I951 Orthostatic hypotension: Secondary | ICD-10-CM

## 2023-01-11 DIAGNOSIS — Z8249 Family history of ischemic heart disease and other diseases of the circulatory system: Secondary | ICD-10-CM | POA: Diagnosis not present

## 2023-01-11 DIAGNOSIS — Z1152 Encounter for screening for COVID-19: Secondary | ICD-10-CM | POA: Diagnosis not present

## 2023-01-11 DIAGNOSIS — W19XXXA Unspecified fall, initial encounter: Secondary | ICD-10-CM | POA: Diagnosis present

## 2023-01-11 DIAGNOSIS — K219 Gastro-esophageal reflux disease without esophagitis: Secondary | ICD-10-CM | POA: Diagnosis present

## 2023-01-11 DIAGNOSIS — E782 Mixed hyperlipidemia: Secondary | ICD-10-CM | POA: Diagnosis present

## 2023-01-11 DIAGNOSIS — I25709 Atherosclerosis of coronary artery bypass graft(s), unspecified, with unspecified angina pectoris: Secondary | ICD-10-CM | POA: Diagnosis present

## 2023-01-11 DIAGNOSIS — I447 Left bundle-branch block, unspecified: Secondary | ICD-10-CM | POA: Diagnosis present

## 2023-01-11 DIAGNOSIS — G20A1 Parkinson's disease without dyskinesia, without mention of fluctuations: Secondary | ICD-10-CM | POA: Diagnosis present

## 2023-01-11 DIAGNOSIS — G250 Essential tremor: Secondary | ICD-10-CM | POA: Diagnosis present

## 2023-01-11 DIAGNOSIS — Z7984 Long term (current) use of oral hypoglycemic drugs: Secondary | ICD-10-CM | POA: Diagnosis not present

## 2023-01-11 LAB — BASIC METABOLIC PANEL
Anion gap: 8 (ref 5–15)
BUN: 15 mg/dL (ref 8–23)
CO2: 25 mmol/L (ref 22–32)
Calcium: 8.4 mg/dL — ABNORMAL LOW (ref 8.9–10.3)
Chloride: 106 mmol/L (ref 98–111)
Creatinine, Ser: 0.95 mg/dL (ref 0.44–1.00)
GFR, Estimated: >60 mL/min (ref >60)
Glucose, Bld: 94 mg/dL (ref 70–99)
Potassium: 3.5 mmol/L (ref 3.5–5.1)
Sodium: 139 mmol/L (ref 135–145)

## 2023-01-11 LAB — URINE DRUG SCREEN, QUALITATIVE (ARMC ONLY)
Amphetamines, Ur Screen: NOT DETECTED
Barbiturates, Ur Screen: NOT DETECTED
Benzodiazepine, Ur Scrn: POSITIVE — AB
Cannabinoid 50 Ng, Ur ~~LOC~~: NOT DETECTED
Cocaine Metabolite,Ur ~~LOC~~: NOT DETECTED
MDMA (Ecstasy)Ur Screen: NOT DETECTED
Methadone Scn, Ur: NOT DETECTED
Opiate, Ur Screen: NOT DETECTED
Phencyclidine (PCP) Ur S: NOT DETECTED
Tricyclic, Ur Screen: NOT DETECTED

## 2023-01-11 LAB — LIPID PANEL
Cholesterol: 160 mg/dL (ref 0–200)
HDL: 59 mg/dL (ref >40)
LDL Cholesterol: 83 mg/dL (ref 0–99)
Total CHOL/HDL Ratio: 2.7 {ratio}
Triglycerides: 91 mg/dL (ref <150)
VLDL: 18 mg/dL (ref 0–40)

## 2023-01-11 LAB — CBC
HCT: 35.1 % — ABNORMAL LOW (ref 36.0–46.0)
Hemoglobin: 11.4 g/dL — ABNORMAL LOW (ref 12.0–15.0)
MCH: 33.2 pg (ref 26.0–34.0)
MCHC: 32.5 g/dL (ref 30.0–36.0)
MCV: 102.3 fL — ABNORMAL HIGH (ref 80.0–100.0)
Platelets: 172 10*3/uL (ref 150–400)
RBC: 3.43 MIL/uL — ABNORMAL LOW (ref 3.87–5.11)
RDW: 12.5 % (ref 11.5–15.5)
WBC: 6.5 10*3/uL (ref 4.0–10.5)
nRBC: 0 % (ref 0.0–0.2)

## 2023-01-11 LAB — GLUCOSE, CAPILLARY
Glucose-Capillary: 102 mg/dL — ABNORMAL HIGH (ref 70–99)
Glucose-Capillary: 133 mg/dL — ABNORMAL HIGH (ref 70–99)
Glucose-Capillary: 151 mg/dL — ABNORMAL HIGH (ref 70–99)
Glucose-Capillary: 133 mg/dL — ABNORMAL HIGH (ref 70–99)

## 2023-01-11 MED ORDER — ISOSORBIDE MONONITRATE ER 60 MG PO TB24
60.0000 mg | ORAL_TABLET | Freq: Every day | ORAL | Status: DC
  Administered 2023-01-11: 60 mg via ORAL
  Filled 2023-01-11: qty 1

## 2023-01-11 MED ORDER — SODIUM CHLORIDE 0.9 % IV BOLUS
500.0000 mL | Freq: Once | INTRAVENOUS | Status: AC
  Administered 2023-01-11: 500 mL via INTRAVENOUS

## 2023-01-11 MED ORDER — PROPRANOLOL HCL ER 60 MG PO CP24
60.0000 mg | ORAL_CAPSULE | Freq: Every day | ORAL | Status: DC
  Administered 2023-01-11 – 2023-01-15 (×5): 60 mg via ORAL
  Filled 2023-01-11 (×6): qty 1

## 2023-01-11 NOTE — TOC Progression Note (Signed)
Transition of Care [facility]) - Progression Note    Patient Details  Name: Candace [name] MRN: [id number] Date of Birth: 1950-10-30  [facility] CM/SW Contact  Truddie Hidden, RN Phone Number: 01/11/2023, 3:58 PM  Clinical Narrative:    Spoke with patient at bedside rgarding therpay recommendations for [facility]. Patient is agreeable to [facility] and does not have a preference. She stated she has a RW at home. Her daughter will transport her home at discharge.   Referral sent to Maralyn Sago at [facility]         Expected Discharge Plan and Services         Expected Discharge Date: 01/11/23                                     Social Determinants of Health (SDOH) Interventions SDOH Screenings   Food Insecurity: No Food Insecurity (06/30/2022)  Housing: Low Risk  (06/30/2022)  Transportation Needs: No Transportation Needs (06/30/2022)  Utilities: Not At Risk (06/30/2022)  Alcohol Screen: Low Risk  (08/10/2021)  Depression (PHQ2-9): Low Risk  (03/26/2022)  Financial Resource Strain: Low Risk  (03/26/2022)  Physical Activity: Insufficiently Active (08/10/2021)  Social Connections: Moderately Integrated (03/26/2022)  Stress: No Stress Concern Present (03/26/2022)  Tobacco Use: Medium Risk (01/10/2023)    Readmission Risk Interventions     No data to display

## 2023-01-11 NOTE — Evaluation (Signed)
Occupational Therapy Evaluation Patient Details Name: Candace [name] MRN: [id number] DOB: [birth year] Today's Date: 01/11/2023   History of Present Illness 72 y/o female presented to ED on 01/10/23 for chest pain and fall. Admitted for possible syncope. PMH: HTN, T2DM, anxiety, depression, CAD s/p CABG, hx of CVA, Afib on Eliquis.   Clinical Impression   Pt was seen for OT evaluation this date. Prior to hospital admission, pt was living alone in a ground level apartment alone. She was MOD I/IND with ADL/IADL, driving and using no AD for mobility.   Pt presents to acute OT demonstrating impaired ADL performance and functional mobility 2/2 weakness from dizziness causing balance deficits/falls. PT/OT saw together d/t syncope/falls/dizziness for safety. Pt currently requires SUP for bed mobility. No dizziness with going from supine to sit at EOB. Pt performed STS with Min A intially d/t mild posterior lean, then progressed to CGA X2. Pt became dizzy in standing and noted initally with a rise in BP immediately (unsure of accuracy d/t arm position), however after 2 mins in standing dropped to 108/56. Orthostatic vitals listed below and can be found in PT note. Required HHA x2 for transfer to recliner with improvement in BP and dizziness upon sitting. Pt would benefit from skilled OT services to address noted impairments and functional limitations (see below for any additional details) in order to maximize safety and independence while minimizing falls risk and caregiver burden. May benefit from follow up OT services upon acute hospital DC if becomes medically stable/dizziness is resolved to return home.         If plan is discharge home, recommend the following: A little help with walking and/or transfers;A little help with bathing/dressing/bathroom;Assistance with cooking/housework;Help with stairs or ramp for entrance    Functional Status Assessment  Patient has had a recent decline in their  functional status and demonstrates the ability to make significant improvements in function in a reasonable and predictable amount of time.  Equipment Recommendations  None recommended by OT    Recommendations for Other Services       Precautions / Restrictions Precautions Precautions: Fall Precaution Comments: watch BP Restrictions Weight Bearing Restrictions: No      Mobility Bed Mobility Overal bed mobility: Needs Assistance Bed Mobility: Supine to Sit     Supine to sit: Supervision          Transfers Overall transfer level: Needs assistance Equipment used: None, 2 person hand held assist Transfers: Sit to/from Stand, Bed to chair/wheelchair/BSC Sit to Stand: Min assist     Step pivot transfers: Contact guard assist (HHAx2)     General transfer comment: assist to steady upon standing with mild posterior lean. CGA for transfer to recliner with HHAx2      Balance Overall balance assessment: Mild deficits observed, not formally tested, History of Falls                                         ADL either performed or assessed with clinical judgement   ADL Overall ADL's : Needs assistance/impaired                           Toilet Transfer Details (indicate cue type and reason): simulated from bed>reclienr transfer needed CGA/HHA x2 and inital stand requring Min A d/t posterior lean  General ADL Comments: pt would benefit from education of AE/AD to prevent dizziness from positional changes; likely to get dizzy and need assist with LB ADLs     Vision         Perception         Praxis         Pertinent Vitals/Pain Pain Assessment Pain Assessment: No/denies pain     Extremity/Trunk Assessment Upper Extremity Assessment Upper Extremity Assessment: Overall WFL for tasks assessed   Lower Extremity Assessment Lower Extremity Assessment: Generalized weakness   Cervical / Trunk Assessment Cervical / Trunk  Assessment: Kyphotic   Communication Communication Communication: No apparent difficulties   Cognition Arousal: Alert Behavior During Therapy: WFL for tasks assessed/performed Overall Cognitive Status: Within Functional Limits for tasks assessed                                       General Comments  denies pain, dizzy with standing that did not subside until seated in recliner    Exercises     Shoulder Instructions      Home Living Family/patient expects to be discharged to:: Private residence Living Arrangements: Alone Available Help at Discharge: Family Type of Home: Apartment Home Access: Level entry     Home Layout: One level     Bathroom Shower/Tub: Tub/shower unit         Home Equipment: Agricultural consultant (2 wheels);Cane - single point;Tub bench          Prior Functioning/Environment Prior Level of Function : Independent/Modified Independent;Driving             Mobility Comments: reports independence and 1 fall in past 6 months ADLs Comments: IND with ADLs and IADLs including cooking, cleaning, etc.        OT Problem List: Decreased strength;Impaired balance (sitting and/or standing)      OT Treatment/Interventions: Self-care/ADL training;Patient/family education;DME and/or AE instruction;Therapeutic activities;Therapeutic exercise    OT Goals(Current goals can be found in the care plan section) Acute Rehab OT Goals Patient Stated Goal: return home and not be dizzy OT Goal Formulation: With patient Time For Goal Achievement: 01/25/23 Potential to Achieve Goals: Good ADL Goals Pt Will Perform Lower Body Bathing: sitting/lateral leans;with supervision Pt Will Perform Lower Body Dressing: with supervision;sit to/from stand Pt Will Transfer to Toilet: with supervision;ambulating;regular height toilet Pt Will Perform Toileting - Clothing Manipulation and hygiene: with supervision;sit to/from stand;sitting/lateral leans Additional ADL  Goal #1: Pt will demo/verbalize appropriate use of DME/AE/AD to perform ADL tasks, e.g. LB bathing/dressing to prevent dizziness from positional changes.  OT Frequency: Min 1X/week    Co-evaluation              AM-PAC OT "6 Clicks" Daily Activity     Outcome Measure Help from another person eating meals?: None Help from another person taking care of personal grooming?: None Help from another person toileting, which includes using toliet, bedpan, or urinal?: A Little Help from another person bathing (including washing, rinsing, drying)?: A Lot Help from another person to put on and taking off regular upper body clothing?: A Little Help from another person to put on and taking off regular lower body clothing?: A Lot 6 Click Score: 18   End of Session Nurse Communication: Mobility status  Activity Tolerance: Patient tolerated treatment well (limited by dizziness) Patient left: in chair;with chair alarm set;with call bell/phone within reach  OT  Visit Diagnosis: Unsteadiness on feet (R26.81);Other abnormalities of gait and mobility (R26.89);History of falling (Z91.81);Muscle weakness (generalized) (M62.81)                Time: 9811-9147 OT Time Calculation (min): 30 min Charges:  OT General Charges $OT Visit: 1 Visit OT Evaluation $OT Eval Moderate Complexity: 1 Mod OT Treatments $Therapeutic Activity: 8-22 mins Mliss Wedin, OTR/L 01/11/23, 12:13 PM  Kadon Andrus E Lalo Tromp 01/11/2023, 12:07 PM

## 2023-01-11 NOTE — Consult Note (Signed)
[facility] CLINIC CARDIOLOGY CONSULT NOTE       Patient ID: Candace [name] MRN: [id number] DOB/AGE: 07/19/1950 72 y.o.  Admit date: 01/10/2023 Referring Physician Dr. Rosezetta Schlatter Primary Physician Bosie Clos, MD  Primary Cardiologist Dr. Juliann Pares Reason for Consultation snycope  HPI: Candace [name] is a 72 y.o. female  with a past medical history of severe 3vCAD s/p CABG x 4 (atretic LIMA-LAD, occluded SVG to OM, patent SVG to RCA, with -70-80% mid LAD, 60-70% diffuse LCx disease (small vessel) and 90% distal RCA by [facility] 07/11/2020), HFpEF (EF 65-70%, G1 DD 06/2022), paroxysmal atrial fibrillation (Eliquis), hx TIA, DM2, essential tremor who presented to the ED on 01/10/2023 for weakness, fall, chest pain. Cardiology was consulted for further evaluation.   Patient reports that last night she woke up to go to the bathroom.  While walking from her bed to the bathroom she states that her knees buckled and she fell.  She endorses feeling like she was shaking and was sweaty.  Also endorses blurred vision during the episode.  States that she "blacked out for a few minutes".  Also reports chest pain prior to the episode, states that she has been having episodes recently due to increased stress.  Had significant weakness after waking up and felt like she could not stand up thus she laid on her floor for over an hour until she was able to call 911.  She was brought to the ED for evaluation by EMS.  Workup in the ED notable for creatinine 0.86, potassium 4.2, hemoglobin 12.5.  Troponins negative x 2 at 7 > 8.  EKG demonstrated normal sinus rhythm with chronic left bundle branch block, nonacute.  CT head and MRI brain without any evidence of acute stroke or bleed.    At the time my evaluation this afternoon patient is resting comfortably in bedside chair.  States that overall she feels fatigued from her episode overnight.  Continues to report chest discomfort but states this is stable from before.  She  denies any other episodes of syncope recently.  States that she worked with physical therapy earlier and generally felt weak with this but tolerated it okay.  States that she has generalized pain which she attributes to her fall.  Otherwise she denies any shortness of breath, palpitations, dizziness, lightheadedness.  Of note, the patient reports that 49 years ago she had a seizure while she was pregnant and had high blood pressure.  Review of systems complete and found to be negative unless listed above    Past Medical History:  Diagnosis Date   Allergy    Anemia    Anxiety    Arrhythmia    Arthritis    Atrial fibrillation (HCC)    CHF (congestive heart failure) (HCC)    COPD (chronic obstructive pulmonary disease) (HCC)    Coronary artery disease    Depression    Diabetes mellitus without complication (HCC)    Dyspnea    doe   Dysrhythmia    GERD (gastroesophageal reflux disease)    Headache    History of hiatal hernia    Hyperlipidemia    Hypertension    Myocardial infarction (HCC)    2016, 04/2017   Myocardial infarction with cardiac rehabilitation [facility])    MI 2016/ CABG 8/17    FINISHED CARDIAC REHAB 3 WEEKS AGO   Panic attack    Pneumonia    Reflux    Stroke (HCC) 2015   showed up on MRI; no  weakness noted   TIA (transient ischemic attack)    Voice tremor     Past Surgical History:  Procedure Laterality Date   ABDOMINAL HYSTERECTOMY     APPENDECTOMY  1975   ARTERY BIOPSY Right 04/26/2016   Procedure: BIOPSY TEMPORAL ARTERY;  Surgeon: Vernie Murders, MD;  Location: [facility] ORS;  Service: ENT;  Laterality: Right;   CARDIAC CATHETERIZATION N/A 11/06/2015   Procedure: Left Heart Cath and Coronary Angiography;  Surgeon: Lamar Blinks, MD;  Location: [facility] INVASIVE CV LAB;  Service: Cardiovascular;  Laterality: N/A;   CESAREAN SECTION     COLONOSCOPY  2015   COLONOSCOPY WITH PROPOFOL N/A 11/03/2018   Procedure: COLONOSCOPY WITH PROPOFOL;  Surgeon: Pasty Spillers, MD;   Location: [facility] ENDOSCOPY;  Service: Endoscopy;  Laterality: N/A;   CORONARY ANGIOPLASTY  04/2017   [facility]   CORONARY ARTERY BYPASS GRAFT N/A 11/10/2015   Procedure: CORONARY ARTERY BYPASS GRAFTING (CABG), ON PUMP, TIMES FOUR, USING LEFT INTERNAL MAMMARY ARTERY, BILATERAL GREATER SAPHENOUS VEINS HARVESTED ENDOSCOPICALLY;  Surgeon: Delight Ovens, MD;  Location: [facility] OR;  Service: Open Heart Surgery;  Laterality: N/A;  LIMA-LAD; SEQ SVG-OM1-OM2; SVG-PL   CORONARY STENT INTERVENTION N/A 08/05/2016   Procedure: Coronary Stent Intervention;  Surgeon: Marcina Millard, MD;  Location: [facility] INVASIVE CV LAB;  Service: Cardiovascular;  Laterality: N/A;   ESOPHAGOGASTRODUODENOSCOPY (EGD) WITH PROPOFOL N/A 11/03/2018   Procedure: ESOPHAGOGASTRODUODENOSCOPY (EGD) WITH PROPOFOL;  Surgeon: Pasty Spillers, MD;  Location: [facility] ENDOSCOPY;  Service: Endoscopy;  Laterality: N/A;   HYSTERECTOMY ABDOMINAL WITH SALPINGO-OOPHORECTOMY Bilateral 08/15/2017   Procedure: HYSTERECTOMY ABDOMINAL WITH BILATERAL SALPINGO-OOPHORECTOMY;  Surgeon: Hildred Laser, MD;  Location: [facility] ORS;  Service: Gynecology;  Laterality: Bilateral;   LEFT HEART CATH AND CORONARY ANGIOGRAPHY N/A 08/05/2016   Procedure: Left Heart Cath and Coronary Angiography;  Surgeon: Marcina Millard, MD;  Location: [facility] INVASIVE CV LAB;  Service: Cardiovascular;  Laterality: N/A;   LEFT HEART CATH AND CORS/GRAFTS ANGIOGRAPHY N/A 09/19/2018   Procedure: LEFT HEART CATH AND CORS/GRAFTS ANGIOGRAPHY;  Surgeon: Lamar Blinks, MD;  Location: [facility] INVASIVE CV LAB;  Service: Cardiovascular;  Laterality: N/A;   LEFT HEART CATH AND CORS/GRAFTS ANGIOGRAPHY N/A 08/30/2019   Procedure: LEFT HEART CATH AND CORS/GRAFTS ANGIOGRAPHY;  Surgeon: Dalia Heading, MD;  Location: [facility] INVASIVE CV LAB;  Service: Cardiovascular;  Laterality: N/A;   OOPHORECTOMY     TEE WITHOUT CARDIOVERSION N/A 11/10/2015   Procedure: TRANSESOPHAGEAL ECHOCARDIOGRAM (TEE);   Surgeon: Delight Ovens, MD;  Location: [facility] OR;  Service: Open Heart Surgery;  Laterality: N/A;   TUBAL LIGATION      Medications Prior to Admission  Medication Sig Dispense Refill Last Dose   acetaminophen (TYLENOL) 325 MG tablet Take 650 mg by mouth every 6 (six) hours as needed for moderate pain or headache.    prn   albuterol (VENTOLIN HFA) 108 (90 Base) MCG/ACT inhaler Inhale 2 puffs into the lungs every 6 (six) hours as needed for wheezing or shortness of breath. 8 g 2 prn   aspirin EC 81 MG tablet Take 81 mg by mouth daily. Swallow whole.   01/09/2023 at 0900   atorvastatin (LIPITOR) 80 MG tablet Take 1 tablet (80 mg total) by mouth daily. 30 tablet 0 01/09/2023 at 0900   Dulaglutide (TRULICITY) 1.5 MG/0.5ML SOPN Inject 1.5 mg into the skin once a week. 6 mL 1 01/05/2023   ELIQUIS 5 MG TABS tablet Take 5 mg by mouth 2 (two) times daily.   01/09/2023  at 2030   ezetimibe (ZETIA) 10 MG tablet Take 1 tablet (10 mg total) by mouth daily. 90 tablet 1 01/09/2023 at 2030   insulin glargine (LANTUS SOLOSTAR) 100 UNIT/ML Solostar Pen Inject 12 Units into the skin at bedtime. (Patient taking differently: Inject 16 Units into the skin at bedtime.) 18 mL 3 01/09/2023 at 2030   isosorbide mononitrate (IMDUR) 120 MG 24 hr tablet Take 120 mg by mouth daily.   01/09/2023 at 0900   JARDIANCE 10 MG TABS tablet Take 25 mg by mouth every morning.   01/09/2023 at 0900   loperamide (IMODIUM) 2 MG capsule Take 2 mg by mouth daily as needed for diarrhea or loose stools.   prn   nitroGLYCERIN (NITROSTAT) 0.4 MG SL tablet Place 1 tablet (0.4 mg total) under the tongue every 5 (five) minutes as needed for chest pain. 30 tablet 0 prn   nystatin (NYSTATIN) powder Apply 1 application topically daily. 60 g 5 01/09/2023   ondansetron (ZOFRAN-ODT) 8 MG disintegrating tablet TAKE ONE TABLET BY MOUTH EVERY 8 HOURS AS NEEDED FOR NAUSEA AND VOMITING 30 tablet 1 prn   pantoprazole (PROTONIX) 20 MG tablet Take 1 tablet (20 mg  total) by mouth 2 (two) times daily. 60 tablet 11 01/09/2023 at 2030   propranolol ER (INDERAL LA) 60 MG 24 hr capsule Take 60 mg by mouth daily.   01/09/2023 at 0900   ranolazine (RANEXA) 1000 MG SR tablet Take 1 tablet (1,000 mg total) by mouth 2 (two) times daily. 60 tablet 0 01/09/2023 at 2030   sertraline (ZOLOFT) 100 MG tablet Take 1 tablet (100 mg total) by mouth daily. (Patient taking differently: Take 100 mg by mouth at bedtime.) 90 tablet 1 01/09/2023 at 2030   Blood Glucose Monitoring Suppl (ONETOUCH VERIO) w/Device KIT Use daily to check blood sugar 1 kit 0    COMFORT EZ PEN NEEDLES 32G X 4 MM MISC Use to inject insulin daily 100 each 1    glucose blood test strip Use to check blood sugars daily as instructed 100 each 12    isosorbide mononitrate (IMDUR) 60 MG 24 hr tablet Take 1 tablet (60 mg total) by mouth daily. (Patient not taking: Reported on 01/10/2023) 30 tablet 0 Not Taking   Lancets (ONETOUCH DELICA PLUS LANCET33G) MISC USE UP TO 4 TIMES DAILY AS DIRECTED 100 each 0    [DISCONTINUED] metoprolol tartrate (LOPRESSOR) 50 MG tablet Take 50 mg by mouth 2 (two) times daily. (Patient not taking: Reported on 01/10/2023)   Not Taking   Social History   Socioeconomic History   Marital status: Divorced    Spouse name: Not on file   Number of children: 3   Years of education: Not on file   Highest education level: Some college, no degree  Occupational History   Occupation: retired  Tobacco Use   Smoking status: Former    Current packs/day: 0.00    Types: Cigarettes    Quit date: 10/07/2001    Years since quitting: 21.2   Smokeless tobacco: Never  Vaping Use   Vaping status: Never Used  Substance and Sexual Activity   Alcohol use: No    Alcohol/week: 0.0 standard drinks of alcohol   Drug use: No   Sexual activity: Not Currently  Other Topics Concern   Not on file  Social History Narrative   Lives at home alone   Social Determinants of Health   Financial Resource  Strain: Low Risk  (03/26/2022)   Overall Financial  Resource Strain (CARDIA)    Difficulty of Paying Living Expenses: Not hard at all  Food Insecurity: No Food Insecurity (06/30/2022)   Hunger Vital Sign    Worried About Running Out of Food in the Last Year: Never true    Ran Out of Food in the Last Year: Never true  Transportation Needs: No Transportation Needs (06/30/2022)   PRAPARE - Administrator, Civil Service (Medical): No    Lack of Transportation (Non-Medical): No  Physical Activity: Insufficiently Active (08/10/2021)   Exercise Vital Sign    Days of Exercise per Week: 2 days    Minutes of Exercise per Session: 20 min  Stress: No Stress Concern Present (03/26/2022)   [employer] of Occupational Health - Occupational Stress Questionnaire    Feeling of Stress : Not at all  Social Connections: Moderately Integrated (03/26/2022)   Social Connection and Isolation Panel [NHANES]    Frequency of Communication with Friends and Family: More than three times a week    Frequency of Social Gatherings with Friends and Family: Twice a week    Attends Religious Services: More than 4 times per year    Active Member of Clubs or Organizations: Yes    Attends Banker Meetings: Never    Marital Status: Divorced  Catering manager Violence: Not At Risk (06/30/2022)   Humiliation, Afraid, Rape, and Kick questionnaire    Fear of Current or Ex-Partner: No    Emotionally Abused: No    Physically Abused: No    Sexually Abused: No    Family History  Problem Relation Age of Onset   Cancer Father    Hypertension Father    Heart disease Father    Asthma Father    Cancer Mother    Hypertension Mother    Pancreatic cancer Mother 28   Cancer Sister    Breast cancer Sister 22   Breast cancer Sister 35   Lung cancer Brother    Pancreatic cancer Sister 16   Cancer Sister      Vitals:   01/10/23 2236 01/10/23 2320 01/11/23 0755 01/11/23 1247  BP: 124/72 (!) 151/88 (!)  133/57 137/63  Pulse: 72 67 71 70  Resp: 12 16 16 17   Temp: (!) 97.4 F (36.3 C) 98.3 F (36.8 C) 98.1 F (36.7 C) 98 F (36.7 C)  TempSrc: Oral Oral    SpO2: 99% 100% 97% 99%  Weight:      Height:        PHYSICAL EXAM General: Chronically ill-appearing, well nourished, in no acute distress upright in bedside chair. HEENT: Normocephalic and atraumatic. Neck: No JVD.  Lungs: Normal respiratory effort on room air. Clear bilaterally to auscultation. No wheezes, crackles, rhonchi.  Heart: HRRR. Normal S1 and S2 without gallops or murmurs.  Abdomen: Non-distended appearing.  Msk: Normal strength and tone for age. Extremities: Warm and well perfused. No clubbing, cyanosis.  No edema.  Neuro: Alert and oriented X 3. Psych: Answers questions appropriately.   Labs: Basic Metabolic Panel: Recent Labs    01/10/23 0933 01/11/23 0417  NA 143 139  K 4.2 3.5  CL 108 106  CO2 27 25  GLUCOSE 175* 94  BUN 17 15  CREATININE 0.86 0.95  CALCIUM 8.7* 8.4*   Liver Function Tests: Recent Labs    01/10/23 0933  AST 17  ALT 15  ALKPHOS 130*  BILITOT 0.6  PROT 7.1  ALBUMIN 3.9   No results for input(s): "LIPASE", "AMYLASE" in the  last 72 hours. CBC: Recent Labs    01/10/23 0933 01/11/23 0417  WBC 8.3 6.5  HGB 12.5 11.4*  HCT 38.6 35.1*  MCV 102.7* 102.3*  PLT 182 172   Cardiac Enzymes: Recent Labs    01/10/23 0933 01/10/23 1144  CKTOTAL 55  --   TROPONINIHS 7 8   BNP: No results for input(s): "BNP" in the last 72 hours. D-Dimer: No results for input(s): "DDIMER" in the last 72 hours. Hemoglobin A1C: No results for input(s): "HGBA1C" in the last 72 hours. Fasting Lipid Panel: Recent Labs    01/11/23 0417  CHOL 160  HDL 59  LDLCALC 83  TRIG 91  CHOLHDL 2.7   Thyroid Function Tests: No results for input(s): "TSH", "T4TOTAL", "T3FREE", "THYROIDAB" in the last 72 hours.  Invalid input(s): "FREET3" Anemia Panel: No results for input(s): "VITAMINB12",  "FOLATE", "FERRITIN", "TIBC", "IRON", "RETICCTPCT" in the last 72 hours.   Radiology: CT LUMBAR SPINE WO CONTRAST  Result Date: 01/10/2023 CLINICAL DATA:  Low back pain. Tenderness over T9-T11 and L2-L3 area. EXAM: CT THORACIC AND LUMBAR SPINE WITHOUT CONTRAST TECHNIQUE: Multidetector CT imaging of the thoracic and lumbar spine was performed without contrast. Multiplanar CT image reconstructions were also generated. RADIATION DOSE REDUCTION: This exam was performed according to the departmental dose-optimization program which includes automated exposure control, adjustment of the mA and/or kV according to patient size and/or use of iterative reconstruction technique. COMPARISON:  CT abdomen pelvis dated 07/01/2022 and chest CT dated 08/11/2021. FINDINGS: Evaluation for fracture is limited due to osteopenia. CT THORACIC SPINE FINDINGS Alignment: No acute subluxation. Vertebrae: No acute fracture.  Osteopenia. Paraspinal and other soft tissues: No perispinal fluid collection. There is coronary vascular calcification. Atherosclerotic calcification of the aorta. Disc levels: No acute findings.  Degenerative changes. CT LUMBAR SPINE FINDINGS Segmentation: 5 lumbar type vertebrae. Alignment: No acute subluxation. Vertebrae: No acute fracture. The bones are osteopenic which limits evaluation for fracture. Paraspinal and other soft tissues: No paraspinal fluid collection. Atherosclerotic calcification of the aorta. Disc levels: No acute findings. Multilevel degenerative changes with disc desiccation and vacuum phenomena. IMPRESSION: 1. No acute/traumatic thoracic or lumbar spine pathology. 2. Osteopenia. 3.  Aortic Atherosclerosis (ICD10-I70.0). Electronically Signed   By: Elgie Collard M.D.   On: 01/10/2023 23:22   CT THORACIC SPINE WO CONTRAST  Result Date: 01/10/2023 CLINICAL DATA:  Low back pain. Tenderness over T9-T11 and L2-L3 area. EXAM: CT THORACIC AND LUMBAR SPINE WITHOUT CONTRAST TECHNIQUE:  Multidetector CT imaging of the thoracic and lumbar spine was performed without contrast. Multiplanar CT image reconstructions were also generated. RADIATION DOSE REDUCTION: This exam was performed according to the departmental dose-optimization program which includes automated exposure control, adjustment of the mA and/or kV according to patient size and/or use of iterative reconstruction technique. COMPARISON:  CT abdomen pelvis dated 07/01/2022 and chest CT dated 08/11/2021. FINDINGS: Evaluation for fracture is limited due to osteopenia. CT THORACIC SPINE FINDINGS Alignment: No acute subluxation. Vertebrae: No acute fracture.  Osteopenia. Paraspinal and other soft tissues: No perispinal fluid collection. There is coronary vascular calcification. Atherosclerotic calcification of the aorta. Disc levels: No acute findings.  Degenerative changes. CT LUMBAR SPINE FINDINGS Segmentation: 5 lumbar type vertebrae. Alignment: No acute subluxation. Vertebrae: No acute fracture. The bones are osteopenic which limits evaluation for fracture. Paraspinal and other soft tissues: No paraspinal fluid collection. Atherosclerotic calcification of the aorta. Disc levels: No acute findings. Multilevel degenerative changes with disc desiccation and vacuum phenomena. IMPRESSION: 1. No acute/traumatic thoracic or lumbar  spine pathology. 2. Osteopenia. 3.  Aortic Atherosclerosis (ICD10-I70.0). Electronically Signed   By: Elgie Collard M.D.   On: 01/10/2023 23:22   DG Sacrum/Coccyx  Result Date: 01/10/2023 CLINICAL DATA:  Sacral pain after fall EXAM: SACRUM AND COCCYX - 2+ VIEW COMPARISON:  None Available. FINDINGS: There is no evidence of fracture or other focal bone lesions. IMPRESSION: Negative. Electronically Signed   By: Jasmine Pang M.D.   On: 01/10/2023 22:47   DG Chest Port 1 View  Result Date: 01/10/2023 CLINICAL DATA:  Weakness chest pain EXAM: PORTABLE CHEST 1 VIEW COMPARISON:  06/30/2022 FINDINGS: Post sternotomy  changes. No acute airspace disease or effusion. Stable cardiomediastinal silhouette. No pneumothorax IMPRESSION: No active disease. Electronically Signed   By: Jasmine Pang M.D.   On: 01/10/2023 22:46   MR BRAIN WO CONTRAST  Result Date: 01/10/2023 CLINICAL DATA:  Stroke suspected, dizziness and blurred vision, now resolved, profound weakness with fall EXAM: MRI HEAD WITHOUT CONTRAST TECHNIQUE: Multiplanar, multiecho pulse sequences of the brain and surrounding structures were obtained without intravenous contrast. COMPARISON:  02/11/2022 MRI head, correlation is also made with 01/10/2023 CT head FINDINGS: Brain: No restricted diffusion to suggest acute or subacute infarct. No acute hemorrhage, mass, mass effect, or midline shift. No hydrocephalus or extra-axial collection. Normal pituitary and craniocervical junction. No hemosiderin deposition to suggest remote hemorrhage. Multiple remote lacunar infarcts in the basal ganglia and right cerebellar lobe. T2 hyperintense signal in the periventricular white matter, likely the sequela of chronic small vessel ischemic disease. Vascular: Normal arterial flow voids. Skull and upper cervical spine: Normal marrow signal. Sinuses/Orbits: Mild mucosal thickening in the ethmoid air cells and inferior left frontal sinus. No acute finding in the orbits. Other: The mastoid air cells are well aerated. IMPRESSION: No acute intracranial process. No evidence of acute or subacute infarct. Electronically Signed   By: Wiliam Ke M.D.   On: 01/10/2023 22:23   CT Head Wo Contrast  Result Date: 01/10/2023 CLINICAL DATA:  72 year old female with chest pain, headache, fall, found down. EXAM: CT HEAD WITHOUT CONTRAST TECHNIQUE: Contiguous axial images were obtained from the base of the skull through the vertex without intravenous contrast. RADIATION DOSE REDUCTION: This exam was performed according to the departmental dose-optimization program which includes automated exposure  control, adjustment of the mA and/or kV according to patient size and/or use of iterative reconstruction technique. COMPARISON:  Brain MRI 02/11/2022.  Head CT 01/01/2022. FINDINGS: Brain: Cerebral volume is within normal limits for age. No midline shift, ventriculomegaly, mass effect, evidence of mass lesion, intracranial hemorrhage or evidence of cortically based acute infarction. Chronic bilateral basal ganglia lacunar infarcts. Chronic encephalomalacia in the body of the corpus callosum. Stable gray-white matter differentiation throughout the brain. Vascular: Calcified atherosclerosis at the skull base. No suspicious intracranial vascular hyperdensity. Skull: Stable, intact. Sinuses/Orbits: Visualized paranasal sinuses and mastoids are stable and well aerated. Other: No orbit or scalp soft tissue injury identified. IMPRESSION: 1. No acute intracranial abnormality or acute traumatic injury identified. 2. Stable non contrast CT appearance of advanced chronic small vessel disease. Electronically Signed   By: Odessa Fleming M.D.   On: 01/10/2023 11:37    ECHO 06/2022: 1. Left ventricular ejection fraction, by estimation, is 65 to 70%. The left ventricle has normal function. The left ventricle has no regional wall motion abnormalities. There is mild concentric left ventricular hypertrophy. Left ventricular diastolic  parameters are consistent with Grade I diastolic dysfunction (impaired relaxation).   2. Right ventricular systolic function is  mildly reduced. The right ventricular size is mildly enlarged. Mildly increased right ventricular wall thickness.   3. Left atrial size was mildly dilated.   4. Right atrial size was mildly dilated.   5. The mitral valve is grossly normal. Trivial mitral valve regurgitation.   6. The aortic valve is normal in structure. Aortic valve regurgitation is trivial. Aortic valve sclerosis is present, with no evidence of aortic valve stenosis.   TELEMETRY reviewed by me 01/11/2023:  Sinus rhythm rate 70s  EKG reviewed by me: sinus rhythm 1st degree AVB, LBBB rate 76 bpm  Data reviewed by me 01/11/2023: last 24h vitals tele labs imaging I/O ED provider note, admission H&P  Principal Problem:   Syncope Active Problems:   Chest pain   Essential hypertension   Gastroesophageal reflux disease   Mixed hyperlipidemia   Coronary artery disease   S/P CABG x 4   Type 2 diabetes mellitus with diabetic polyneuropathy, with long-term current use of insulin (HCC)   Tremors of nervous system   Mild obstructive sleep apnea   COPD suggested by initial evaluation (HCC)   TIA (transient ischemic attack)   Paroxysmal atrial fibrillation (HCC)   Obesity (BMI 30-39.9)   Back pain    ASSESSMENT AND PLAN:  Candace [name] is a 72 y.o. female  with a past medical history of severe 3vCAD s/p CABG x 4 (atretic LIMA-LAD, occluded SVG to OM, patent SVG to RCA, with -70-80% mid LAD, 60-70% diffuse LCx disease (small vessel) and 90% distal RCA by [facility] 07/11/2020), HFpEF (EF 65-70%, G1 DD 06/2022), paroxysmal atrial fibrillation (Eliquis), hx TIA, DM2, essential tremor who presented to the ED on 01/10/2023 for weakness, fall, chest pain. Cardiology was consulted for further evaluation.   # Syncope # Orthostatic hypotension # Paroxysmal atrial fibrillation Patient with episode of fall and subsequent syncope overnight while going to the bathroom.  Also with reported shaking with this episode. Had residual weakness afterwards. CT head and MRI brain unremarkable. No evidence of arrhythmia on telemetry, has known hx of AF. -Will send home with 14 day monitor for further evaluation.  -Continue eliquis 5 mg twice daily.  -Restart home propranolol 60 mg daily.   # Chest pain # Coronary artery disease s/p CABG x4 2017 Patient with history of coronary artery disease as detailed in assessment above with reports of chest pain prior to her episode of syncope.  Continues to report discomfort, states this  has been ongoing for many weeks.  Reportedly has not been taking her home isosorbide. Troponins 7 > 8. -Restart isosorbide mononitrate 60 mg daily.  Continue Ranexa 1000 mg twice daily. -Continue lovastatin 80 mg daily, Zetia 10 mg daily, aspirin 81 mg daily.   This patient's plan of care was discussed and created with Dr. Melton Alar and she is in agreement.  Signed: Gale Journey, PA-C  01/11/2023, 2:58 PM [facility]

## 2023-01-11 NOTE — Evaluation (Signed)
Physical Therapy Evaluation Patient Details Name: Candace [name] MRN: [id number] DOB: [birth year] Today's Date: 01/11/2023  History of Present Illness  72 y/o female presented to ED on 01/10/23 for chest pain and fall. Admitted for possible syncope. PMH: HTN, T2DM, anxiety, depression, CAD s/p CABG, hx of CVA, Afib on Eliquis.  Clinical Impression  Patient admitted with the above. PTA, patient lives alone and reports independence with mobility. Endorses 1 fall (which brought her into hospital) in past 6 months. Patient presents with weakness, impaired balance, and decreased activity tolerance. Session limited by symptomatic orthostatics with reports of dizziness upon standing and not subsiding until seated in recliner at end of session. Required minA to steady upon standing due to posterior lean. Transferred to recliner with CGA and HHAx2. Patient will benefit from skilled PT services during acute stay to address listed deficits. Patient will benefit from ongoing therapy at discharge to maximize functional independence and safety.   Orthostatic BPs  Supine 125/69  Sitting 119/74  Standing 137/63 - reporting dizziness in standing  Standing after 2 min 108/56  Sitting in recliner post t/f 136/86      If plan is discharge home, recommend the following: Assistance with cooking/housework;Assist for transportation;Help with stairs or ramp for entrance   Can travel by private vehicle        Equipment Recommendations Rolling Cornelious Diven (2 wheels)  Recommendations for Other Services       Functional Status Assessment Patient has had a recent decline in their functional status and demonstrates the ability to make significant improvements in function in a reasonable and predictable amount of time.     Precautions / Restrictions Precautions Precautions: Fall Precaution Comments: watch BP Restrictions Weight Bearing Restrictions: No      Mobility  Bed Mobility Overal bed mobility: Needs  Assistance Bed Mobility: Supine to Sit     Supine to sit: Supervision          Transfers Overall transfer level: Needs assistance Equipment used: None, 2 person hand held assist Transfers: Sit to/from Stand, Bed to chair/wheelchair/BSC Sit to Stand: Min assist   Step pivot transfers: Contact guard assist       General transfer comment: assist to steady upon standing with mild posterior lean. CGA for transfer to recliner with HHAx2    Ambulation/Gait               General Gait Details: deferred due to orthostasis and symptomatic  Stairs            Wheelchair Mobility     Tilt Bed    Modified Rankin (Stroke Patients Only)       Balance Overall balance assessment: Mild deficits observed, not formally tested, History of Falls                                           Pertinent Vitals/Pain Pain Assessment Pain Assessment: No/denies pain    Home Living Family/patient expects to be discharged to:: Private residence Living Arrangements: Alone Available Help at Discharge: Family Type of Home: Apartment Home Access: Level entry       Home Layout: One level Home Equipment: Agricultural consultant (2 wheels);Cane - single point;Tub bench      Prior Function Prior Level of Function : Independent/Modified Independent;Driving             Mobility Comments: reports independence and 1 fall in  past 6 months       Extremity/Trunk Assessment   Upper Extremity Assessment Upper Extremity Assessment: Defer to OT evaluation    Lower Extremity Assessment Lower Extremity Assessment: Generalized weakness    Cervical / Trunk Assessment Cervical / Trunk Assessment: Kyphotic  Communication   Communication Communication: No apparent difficulties  Cognition Arousal: Alert Behavior During Therapy: WFL for tasks assessed/performed Overall Cognitive Status: Within Functional Limits for tasks assessed                                           General Comments      Exercises     Assessment/Plan    PT Assessment Patient needs continued PT services  PT Problem List Decreased strength;Decreased activity tolerance;Decreased balance;Decreased mobility;Decreased knowledge of use of DME;Decreased safety awareness;Cardiopulmonary status limiting activity       PT Treatment Interventions DME instruction;Gait training;Functional mobility training;Therapeutic activities;Therapeutic exercise;Balance training;Patient/family education    PT Goals (Current goals can be found in the Care Plan section)  Acute Rehab PT Goals Patient Stated Goal: to go home PT Goal Formulation: With patient Time For Goal Achievement: 01/25/23 Potential to Achieve Goals: Good    Frequency Min 1X/week     Co-evaluation               AM-PAC PT "6 Clicks" Mobility  Outcome Measure Help needed turning from your back to your side while in a flat bed without using bedrails?: A Little Help needed moving from lying on your back to sitting on the side of a flat bed without using bedrails?: A Little Help needed moving to and from a bed to a chair (including a wheelchair)?: A Little Help needed standing up from a chair using your arms (e.g., wheelchair or bedside chair)?: A Little Help needed to walk in hospital room?: A Lot Help needed climbing 3-5 steps with a railing? : A Lot 6 Click Score: 16    End of Session   Activity Tolerance: Treatment limited secondary to medical complications (Comment) (orthostatics) Patient left: in chair;with call bell/phone within reach;with chair alarm set Nurse Communication: Mobility status PT Visit Diagnosis: Unsteadiness on feet (R26.81);Muscle weakness (generalized) (M62.81);History of falling (Z91.81)    Time: 2595-6387 PT Time Calculation (min) (ACUTE ONLY): 29 min   Charges:   PT Evaluation $PT Eval Moderate Complexity: 1 Mod PT Treatments $Therapeutic Activity: 8-22 mins PT General  Charges $$ ACUTE PT VISIT: 1 Visit         [name], PT, DPT Physical Therapist - [facility]  [facility]   Hank Walling A Annaleia Pence 01/11/2023, 11:09 AM

## 2023-01-11 NOTE — Plan of Care (Signed)
  Problem: Cardiac: Goal: Ability to achieve and maintain adequate cardiovascular perfusion will improve Outcome: Progressing   Problem: Coping: Goal: Will verbalize positive feelings about self Outcome: Progressing Goal: Will identify appropriate support needs Outcome: Progressing   Problem: Health Behavior/Discharge Planning: Goal: Ability to manage health-related needs will improve Outcome: Progressing

## 2023-01-11 NOTE — Progress Notes (Signed)
Progress Note   Patient: Candace [name] JYN:[id number] DOB: 28-Nov-1950 DOA: 01/10/2023     0 DOS: the patient was seen and examined on 01/11/2023   Brief hospital course: [name] is a 72 year old female with history of hyperlipidemia, insulin-dependent diabetes mellitus, hypertension, GERD, depression, anxiety, on anticoagulation, CAD status post CABG LIMA to LAD, SVG to PDA and OM, DES to SVG in 2021, and balloon angioplasty at outside hospital in 2023, history of CVA, paroxysmal atrial fibrillation, on Eliquis, who presents emergency department who presents emergency department for chief concerns of chest pain and increased falling.  Patient had a negative troponin on presentation, brain imaging did not show any acute intracranial pathology.  Orthostatic blood pressure was positive.  Cardiology is following   Assessment and Plan:  Syncope secondary to orthostatic hypotension to rule out possible arrhythmia  I have reviewed patient MRI of the brain that did not show any acute intracranial pathology Cardiologist consulted and case discussed Zio patch have been placed for monitoring and for outpatient follow-up PT OT work with patient and patient's blood pressure was positive for orthostatic IV fluid bolus have been ordered We will continue to monitor orthostatic blood pressure closely   Chest pain-resolved High sensitive troponin were negative x 2. Echo done on 07/22/2022: Showed EF estimated ejection fraction 65 to 70%.  Grade 1 diastolic dysfunction. Cardiologist following Continue cardiac monitoring   Type 2 diabetes mellitus with diabetic polyneuropathy, with long-term current use of insulin (HCC) Patient takes Jardiance 10 mg daily, Trulicity 1.5 mg subcutaneous injection weekly, Lantus 100 units subcutaneous nightly Continue insulin SSI with at bedtime coverage ordered Goal inpatient blood glucose levels 140-180 Monitor blood pressure closely   Paroxysmal atrial  fibrillation (HCC) Continue Eliquis   Essential hypertension Continue current blood pressure medication   Back pain Query secondary to fall CT scan of the lumbar and thoracic spine all within normal limit with no fractures Continue as needed pain medication    Mild obstructive sleep apnea Continue CPAP nightly ordered   Parkinson syndrome I discussed with neurologist and patient has diagnosis of Parkinson's syndrome and follows up with neurology as an outpatient Continue outpatient follow-up   Coronary artery disease S/P CABG x 4 Continue Home Ranexa 1000 mg p.o. twice daily resumed Continue atorvastatin 80 mg daily, ezetimibe 10 mg daily, aspirin 81 mg daily, Eliquis 5 mg p.o. twice daily      Hyperlipidemia Continue atorvastatin 80 mg daily   Gastroesophageal reflux disease Protonix 20 mg p.o. twice daily resumed       DVT prophylaxis: Apixaban 5 mg p.o. twice daily  Code Status: MOST in ACP reviewed states patient is DNR.  However at bedside, patient states that she would like to have full resuscitative measures 1 time only.  Full code ordered  Diet: Heart healthy  Subjective:  Patient seen and examined at bedside this morning She tells me her chest pain is better Cardiologist have been consulted for input She denies nausea vomiting abdominal pain or urinary complaints  Physical Exam:  Constitutional: Elderly female in no acute distress Eyes: No conjunctival pallor Respiratory: Clear to auscultation bilaterally Cardiovascular: Regular rate and rhythm, no murmurs / rubs / gallops. No extremity edema. 2+ pedal pulses. No carotid bruits.  Abdomen: Obese abdomen, no tenderness,  Musculoskeletal: no clubbing / cyanosis. N Skin: no rashes, lesions, ulcers. No induration Neurologic: Sensation intact. Strength 5/5 in all 4.  Psychiatric: Normal judgment and insight. Alert and oriented x 3. Normal  mood.    Data Reviewed: I have reviewed patient MRI of the brain that  did not show any acute intracranial pathology,.  Reviewed patient's CT thoracic spine as well as lumbar that did not show any acute pathology aside osteopenia. I have reviewed patient below mentioned labs including CBC and CMP Have also reviewed cardiology documentation, admitting provider documentation as well as PT OT notes   Family Communication: I discussed the plan of care with patient's daughter over the phone  Disposition: Status is: Observation   Planned Discharge Destination: Hopefully home when medically stable    Time spent: 56 minutes spent reviewing above-mentioned data as well as taking care of patient and discussing with cardiologist.  50% of this time was spent at bedside discussing patient's condition with her as well as with family    Latest Ref Rng & Units 01/11/2023    4:17 AM 01/10/2023    9:33 AM 07/02/2022    4:37 AM  CMP  Glucose 70 - 99 mg/dL 94  782  956   BUN 8 - 23 mg/dL 15  17  22    Creatinine 0.44 - 1.00 mg/dL 2.13  0.86  5.78   Sodium 135 - 145 mmol/L 139  143  138   Potassium 3.5 - 5.1 mmol/L 3.5  4.2  4.1   Chloride 98 - 111 mmol/L 106  108  109   CO2 22 - 32 mmol/L 25  27  23    Calcium 8.9 - 10.3 mg/dL 8.4  8.7  8.2   Total Protein 6.5 - 8.1 g/dL  7.1    Total Bilirubin 0.3 - 1.2 mg/dL  0.6    Alkaline Phos 38 - 126 U/L  130    AST 15 - 41 U/L  17    ALT 0 - 44 U/L  15         Latest Ref Rng & Units 01/11/2023    4:17 AM 01/10/2023    9:33 AM 07/02/2022    4:37 AM  CBC  WBC 4.0 - 10.5 K/uL 6.5  8.3  5.9   Hemoglobin 12.0 - 15.0 g/dL 46.9  62.9  52.8   Hematocrit 36.0 - 46.0 % 35.1  38.6  33.7   Platelets 150 - 400 K/uL 172  182  137     Vitals:   01/10/23 2236 01/10/23 2320 01/11/23 0755 01/11/23 1247  BP: 124/72 (!) 151/88 (!) 133/57 137/63  Pulse: 72 67 71 70  Resp: 12 16 16 17   Temp: (!) 97.4 F (36.3 C) 98.3 F (36.8 C) 98.1 F (36.7 C) 98 F (36.7 C)  TempSrc: Oral Oral    SpO2: 99% 100% 97% 99%  Weight:      Height:           Author: Loyce Dys, MD 01/11/2023 2:15 PM  For on call review [email or web address].

## 2023-01-12 DIAGNOSIS — R55 Syncope and collapse: Secondary | ICD-10-CM | POA: Diagnosis not present

## 2023-01-12 LAB — BASIC METABOLIC PANEL
Anion gap: 7 (ref 5–15)
BUN: 12 mg/dL (ref 8–23)
CO2: 25 mmol/L (ref 22–32)
Calcium: 8.4 mg/dL — ABNORMAL LOW (ref 8.9–10.3)
Chloride: 109 mmol/L (ref 98–111)
Creatinine, Ser: 0.83 mg/dL (ref 0.44–1.00)
GFR, Estimated: >60 mL/min (ref >60)
Glucose, Bld: 122 mg/dL — ABNORMAL HIGH (ref 70–99)
Potassium: 3.3 mmol/L — ABNORMAL LOW (ref 3.5–5.1)
Sodium: 141 mmol/L (ref 135–145)

## 2023-01-12 LAB — CBC WITH DIFFERENTIAL/PLATELET
Abs Immature Granulocytes: 0.01 10*3/uL (ref 0.00–0.07)
Basophils Absolute: 0 10*3/uL (ref 0.0–0.1)
Basophils Relative: 1 %
Eosinophils Absolute: 0.1 10*3/uL (ref 0.0–0.5)
Eosinophils Relative: 2 %
HCT: 34.6 % — ABNORMAL LOW (ref 36.0–46.0)
Hemoglobin: 11.3 g/dL — ABNORMAL LOW (ref 12.0–15.0)
Immature Granulocytes: 0 %
Lymphocytes Relative: 53 %
Lymphs Abs: 2.8 10*3/uL (ref 0.7–4.0)
MCH: 33.1 pg (ref 26.0–34.0)
MCHC: 32.7 g/dL (ref 30.0–36.0)
MCV: 101.5 fL — ABNORMAL HIGH (ref 80.0–100.0)
Monocytes Absolute: 0.5 10*3/uL (ref 0.1–1.0)
Monocytes Relative: 9 %
Neutro Abs: 1.9 10*3/uL (ref 1.7–7.7)
Neutrophils Relative %: 35 %
Platelets: 167 10*3/uL (ref 150–400)
RBC: 3.41 MIL/uL — ABNORMAL LOW (ref 3.87–5.11)
RDW: 12.4 % (ref 11.5–15.5)
WBC: 5.3 10*3/uL (ref 4.0–10.5)
nRBC: 0 % (ref 0.0–0.2)

## 2023-01-12 LAB — GLUCOSE, CAPILLARY
Glucose-Capillary: 126 mg/dL — ABNORMAL HIGH (ref 70–99)
Glucose-Capillary: 252 mg/dL — ABNORMAL HIGH (ref 70–99)
Glucose-Capillary: 157 mg/dL — ABNORMAL HIGH (ref 70–99)
Glucose-Capillary: 141 mg/dL — ABNORMAL HIGH (ref 70–99)

## 2023-01-12 MED ORDER — POTASSIUM CHLORIDE CRYS ER 20 MEQ PO TBCR
30.0000 meq | EXTENDED_RELEASE_TABLET | Freq: Once | ORAL | Status: AC
  Administered 2023-01-12: 30 meq via ORAL
  Filled 2023-01-12: qty 1

## 2023-01-12 MED ORDER — ISOSORBIDE MONONITRATE ER 60 MG PO TB24
120.0000 mg | ORAL_TABLET | Freq: Every day | ORAL | Status: DC
  Administered 2023-01-12: 120 mg via ORAL
  Filled 2023-01-12: qty 2

## 2023-01-12 MED ORDER — BUTALBITAL-APAP-CAFFEINE 50-325-40 MG PO TABS
1.0000 | ORAL_TABLET | Freq: Once | ORAL | Status: AC
  Administered 2023-01-12: 1 via ORAL
  Filled 2023-01-12: qty 1

## 2023-01-12 MED ORDER — ISOSORBIDE MONONITRATE ER 30 MG PO TB24
30.0000 mg | ORAL_TABLET | Freq: Every day | ORAL | Status: DC
  Administered 2023-01-13 – 2023-01-14 (×2): 30 mg via ORAL
  Filled 2023-01-12 (×2): qty 1

## 2023-01-12 NOTE — TOC Progression Note (Addendum)
Transition of Care [facility]) - Progression Note    Patient Details  Name: Candace [name] MRN: [id number] Date of Birth: 20-Sep-1950  [facility] of [facility]) CM/SW Contact  Truddie Hidden, RN Phone Number: 01/12/2023, 9:57 AM  Clinical Narrative:    Cindie Laroche declined referral due to not having a PT.  Referral sent and acepted by  Coralee North from [place].      Expected Discharge Plan and Services         Expected Discharge Date: 01/11/23                                     Social Determinants of Health (SDOH) Interventions SDOH Screenings   Food Insecurity: No Food Insecurity (06/30/2022)  Housing: Low Risk  (06/30/2022)  Transportation Needs: No Transportation Needs (06/30/2022)  Utilities: Not At Risk (06/30/2022)  Alcohol Screen: Low Risk  (08/10/2021)  Depression (PHQ2-9): Low Risk  (03/26/2022)  Financial Resource Strain: Low Risk  (03/26/2022)  Physical Activity: Insufficiently Active (08/10/2021)  Social Connections: Moderately Integrated (03/26/2022)  Stress: No Stress Concern Present (03/26/2022)  Tobacco Use: Medium Risk (01/10/2023)    Readmission Risk Interventions     No data to display

## 2023-01-12 NOTE — Progress Notes (Signed)
Mobility Specialist - Progress Note     01/12/23 1200  Orthostatic Sitting  BP- Sitting 128/73  Orthostatic Standing at 0 minutes  BP- Standing at 0 minutes 124/80  Orthostatic Standing at 3 minutes  BP- Standing at 3 minutes (!) 112/93  Oxygen Therapy  SpO2 100 %  O2 Device Room Air  Mobility  Activity Ambulated with assistance in room  Level of Assistance Moderate assist, patient does 50-74%  Assistive Device Front wheel walker  Distance Ambulated (ft) 6 ft  Range of Motion/Exercises Active  Activity Response Tolerated well  Mobility Referral Yes  $Mobility charge 1 Mobility  Mobility Specialist Start Time (ACUTE ONLY) 1200  Mobility Specialist Stop Time (ACUTE ONLY) 1218  Mobility Specialist Time Calculation (min) (ACUTE ONLY) 18 min   Pt resting in recliner on RA upon entry. Pt STS x2 Mod and takes 6 stepsf forward in room with RW and 6 steps back. Pt endorses 8/10 dizziness during first stand and took 3 minute seated rest break before trying again. Pt endorses less dizziness during second stand. Pt returned to recliner and left with needs in reach. Daughter present at bedside.   Johnathan Hausen Mobility Specialist 01/12/23, 12:22 PM

## 2023-01-12 NOTE — TOC Progression Note (Signed)
Transition of Care [facility]) - Progression Note    Patient Details  Name: Candace [name] MRN: [id number] Date of Birth: 1950/11/21  Transition of Care [facility]) CM/SW Contact  Truddie Hidden, RN Phone Number: 01/12/2023, 4:03 PM  Clinical Narrative:    Spoke with patient regarding therapy's recommendation for SNF. Patient stated she wanted to go home. Patient stated she and her daughter were discussing plans. Patient request RNCM to speak with her daughter.   Attempt to reach patient's daughter, Maralyn Sago. No answer. Left a message.   FL2 completed.         Expected Discharge Plan and Services         Expected Discharge Date: 01/11/23                                     Social Determinants of Health (SDOH) Interventions SDOH Screenings   Food Insecurity: No Food Insecurity (06/30/2022)  Housing: Low Risk  (06/30/2022)  Transportation Needs: No Transportation Needs (06/30/2022)  Utilities: Not At Risk (06/30/2022)  Alcohol Screen: Low Risk  (08/10/2021)  Depression (PHQ2-9): Low Risk  (03/26/2022)  Financial Resource Strain: Low Risk  (03/26/2022)  Physical Activity: Insufficiently Active (08/10/2021)  Social Connections: Moderately Integrated (03/26/2022)  Stress: No Stress Concern Present (03/26/2022)  Tobacco Use: Medium Risk (01/10/2023)    Readmission Risk Interventions     No data to display

## 2023-01-12 NOTE — Progress Notes (Addendum)
Occupational Therapy Treatment Patient Details Name: Candace [name] MRN: [id number] DOB: 1950/04/14 Today's Date: 01/12/2023   History of present illness 72 y/o female presented to ED on 01/10/23 for chest pain and fall. Admitted for possible syncope. PMH: HTN, T2DM, anxiety, depression, CAD s/p CABG, hx of CVA, Afib on Eliquis.   OT comments  Pt is seated in recliner on arrival. Pleasant and agreeable to OT session. Granddaughter present in room. Pt reports a headache that continued to linger most of the day. Required Min A for STS from recliner x2-3 trials with pt becoming dizzy both times with extended rest break between trials for dizziness to subside. R lateral and anterior lean noted at times in standing/swaying due to dizziness. Would subside with time once seated.   Orthostatic Bps: Sitting: 123/59 Standing: 106/65 Sitting: 110/74   Pt took a few steps to get to [facility] from recliner using RW with Min A for safety d/t dizziness. Able to stand at [facility] with MIN/CGA while she performed anterior hygiene in standing. Pt returned to recliner. Extensive time spent talking with patient and granddaughter about SNF versus HH for more intensive therapy to return home safely. Left in recliner with all needs in place and will cont to require skilled acute OT services to maximize her safety and IND to return to PLOF.       If plan is discharge home, recommend the following:  A little help with walking and/or transfers;A little help with bathing/dressing/bathroom;Assistance with cooking/housework;Help with stairs or ramp for entrance   Equipment Recommendations  BSC/3in1    Recommendations for Other Services      Precautions / Restrictions Precautions Precautions: Fall Precaution Comments: watch BP Restrictions Weight Bearing Restrictions: No       Mobility Bed Mobility                    Transfers Overall transfer level: Needs assistance Equipment used: Rolling walker (2  wheels) Transfers: Sit to/from Stand Sit to Stand: Min assist                 Balance                                           ADL either performed or assessed with clinical judgement   ADL Overall ADL's : Needs assistance/impaired                         Toilet Transfer: Minimal assistance;BSC/3in1 Statistician Details (indicate cue type and reason): pt able to take a few steps from recliner to [facility] with CGA X2/MIN Ax1 Toileting- Clothing Manipulation and Hygiene: Contact guard assist Toileting - Clothing Manipulation Details (indicate cue type and reason): anterior hygiene performed in standing on her own with CGA/MIN A for balance     Functional mobility during ADLs: Minimal assistance;Contact guard assist      Extremity/Trunk Assessment Upper Extremity Assessment Upper Extremity Assessment: Generalized weakness   Lower Extremity Assessment Lower Extremity Assessment: Generalized weakness        Vision       Perception     Praxis      Cognition Arousal: Alert Behavior During Therapy: WFL for tasks assessed/performed Overall Cognitive Status: Within Functional Limits for tasks assessed  Exercises Other Exercises Other Exercises: extensive time spent in room with pt and her granddaughter discussing going to rehab versus HH for more extensive rehab d/t more weakness noted today 2/2 limited activity from dizziness    Shoulder Instructions       General Comments pt still dizzy with standing trials that do not subside until returned to seated position, also with headache today    Pertinent Vitals/ Pain       Pain Assessment Pain Assessment: Faces Faces Pain Scale: Hurts little more Pain Location: headache Pain Descriptors / Indicators: Headache Pain Intervention(s): Monitored during session (PT notified MD)  Home Living                                           Prior Functioning/Environment              Frequency  Min 1X/week        Progress Toward Goals  OT Goals(current goals can now be found in the care plan section)  Progress towards OT goals: Progressing toward goals  Acute Rehab OT Goals Patient Stated Goal: reduce dizziness to return home OT Goal Formulation: With patient Time For Goal Achievement: 01/25/23 Potential to Achieve Goals: Good  Plan      Co-evaluation    PT/OT/SLP Co-Evaluation/Treatment: Yes Reason for Co-Treatment: Complexity of the patient's impairments (multi-system involvement);For patient/therapist safety;To address functional/ADL transfers PT goals addressed during session: Mobility/safety with mobility;Balance OT goals addressed during session: ADL's and self-care      AM-PAC OT "6 Clicks" Daily Activity     Outcome Measure   Help from another person eating meals?: None Help from another person taking care of personal grooming?: None Help from another person toileting, which includes using toliet, bedpan, or urinal?: A Little Help from another person bathing (including washing, rinsing, drying)?: A Lot Help from another person to put on and taking off regular upper body clothing?: A Little Help from another person to put on and taking off regular lower body clothing?: A Lot 6 Click Score: 18    End of Session Equipment Utilized During Treatment: Rolling walker (2 wheels)  OT Visit Diagnosis: Unsteadiness on feet (R26.81);Other abnormalities of gait and mobility (R26.89);History of falling (Z91.81);Muscle weakness (generalized) (M62.81)   Activity Tolerance Patient tolerated treatment well (limited by dizziness/BP issues)   Patient Left in chair;with call bell/phone within reach;with family/visitor present   Nurse Communication Mobility status        Time: 9562-1308 OT Time Calculation (min): 39 min  Charges: OT General Charges $OT Visit: 1 Visit OT  Treatments $Self Care/Home Management : 8-22 mins Rachelle Edwards, OTR/L  01/12/23, 2:58 PM  Marny Smethers E Anvith Mauriello 01/12/2023, 2:53 PM

## 2023-01-12 NOTE — Hospital Course (Addendum)
HPI: [name] is a 72 year old female with history of hyperlipidemia, insulin-dependent diabetes mellitus, hypertension, GERD, depression, anxiety, on anticoagulation, CAD status post CABG LIMA to LAD, SVG to PDA and OM, DES to SVG in 2021, and balloon angioplasty at outside hospital in 2023, history of CVA, paroxysmal atrial fibrillation, on Eliquis, who presents emergency department for chief concerns of chest pain and increased falling.   Hospital course / significant events:  10/14: admitted to hospitalist service, syncope d/t orthostatic hypotension, question arrhythmia. Troponin trended negative, CP resolved. MRI brain no concerns. Imaging T/L-spine and sacrum/coccyx no concerns.  10/15: PT/OT eval, recs for HH. Cardiology recs for 14-day cardiac monitoring, meds as below  10/16: significant orthostatic drop BP standing w/ dizziness symptoms. PT/OT update recs for SNF. Lowered dose isosorbide  10/17: pt feeling better today, awaiting SNF placement  10/18: was ready for d/c until significant dizziness and CP w/ ambulation - held d/c 10/19: suspect parkinsons, will start low dose sinemet and will d/c propranolol and trial nebivolol to hopefully reduce side effects  10/20: await SNF placement tomorrow  10/21: stable for discharge, daughter updated, all questions answered form her and form patient   Consultants:  Cardiology   Procedures/Surgeries: none      ASSESSMENT & PLAN:   Syncope secondary to orthostatic hypotension, multiple risk factors  W/u possible arrhythmia Zio patch have been placed for monitoring and for outpatient follow-up PT OT work with patient and patient's blood pressure was positive for orthostatic drop Reduced isosorbide but the resumed higher dose given increase chest pain     Coronary artery disease S/P CABG x 4 Chest pain - intermittent exertional angina  High sensitive troponin were negative x 2. Echo done on 07/22/2022: Showed EF estimated  ejection fraction 65 to 70%.  Grade 1 diastolic dysfunction. Home Ranexa 1000 mg p.o. twice daily  D/c propranolol 60 mg daily, was on this for cardiac but also tremor, hasn't helped tremor, started bisoprolol hopefully this will minimize adverse effects w/ orthostatic symptoms  Continue atorvastatin 80 mg daily, ezetimibe 10 mg daily, aspirin 81 mg daily, Eliquis 5 mg p.o. twice daily Reduced isosorbide given orthostatic hypotension but then increased d/t chest pain   Type 2 diabetes mellitus with diabetic polyneuropathy, with long-term current use of insulin (HCC) Patient takes Jardiance 10 mg daily, Trulicity 1.5 mg subcutaneous injection weekly, Lantus 100 units subcutaneous nightly Continue insulin SSI with at bedtime coverage ordered Goal inpatient blood glucose levels 140-180   Paroxysmal atrial fibrillation (HCC) Continue Eliquis  Back pain Query secondary to fall CT scan of the lumbar and thoracic spine all within normal limit with no fractures Continue as needed pain medication    Mild obstructive sleep apnea Continue CPAP nightly    Parkinson syndrome Can also contribute to unsteady gait / fall risk Continue outpatient follow-up Have started low dose sinemet here    Hyperlipidemia Continue atorvastatin 80 mg daily   Gastroesophageal reflux disease Protonix 20 mg p.o. twice daily resumed    obesity based on BMI: Body mass index is 30.18 kg/m.   DVT prophylaxis: eliquis IV fluids: no continuous IV fluids  Nutrition: cardiac diet  Central lines / invasive devices: none  Code Status: FULL CODE ACP documentation reviewed: DNR order is on file in [facility] was voided after confirming FULL CODE w/ patient   TOC needs: SNF placement  Barriers to dispo / significant pending items: placement, monitro BP on lower isosorbide

## 2023-01-12 NOTE — Progress Notes (Signed)
[facility] CARDIOLOGY CONSULT NOTE       Patient ID: Candace [name] MRN: [id number] DOB/AGE: 71-Jan-1952 71 y.o.  Admit date: 01/10/2023 Referring Physician Dr. Rosezetta Schlatter Primary Physician Bosie Clos, MD  Primary Cardiologist Dr. Juliann Pares Reason for Consultation snycope  HPI: Candace [name] is a 72 y.o. female  with a past medical history of severe 3vCAD s/p CABG x 4 (atretic LIMA-LAD, occluded SVG to OM, patent SVG to RCA, with -70-80% mid LAD, 60-70% diffuse LCx disease (small vessel) and 90% distal RCA by [facility] 07/11/2020), HFpEF (EF 65-70%, G1 DD 06/2022), paroxysmal atrial fibrillation (Eliquis), hx TIA, DM2, essential tremor who presented to the ED on 01/10/2023 for weakness, fall, chest pain. Cardiology was consulted for further evaluation.   Interval history: -Patient feeling well overall this AM. Without complaints at the time of my evaluation.  -She is planning to go home with [facility] today.  -BP elevated this AM.   Review of systems complete and found to be negative unless listed above    Past Medical History:  Diagnosis Date   Allergy    Anemia    Anxiety    Arrhythmia    Arthritis    Atrial fibrillation (HCC)    CHF (congestive heart failure) (HCC)    COPD (chronic obstructive pulmonary disease) (HCC)    Coronary artery disease    Depression    Diabetes mellitus without complication (HCC)    Dyspnea    doe   Dysrhythmia    GERD (gastroesophageal reflux disease)    Headache    History of hiatal hernia    Hyperlipidemia    Hypertension    Myocardial infarction (HCC)    2016, 04/2017   Myocardial infarction with cardiac rehabilitation [facility])    MI 2016/ CABG 8/17    FINISHED CARDIAC REHAB 3 WEEKS AGO   Panic attack    Pneumonia    Reflux    Stroke (HCC) 2015   showed up on MRI; no weakness noted   TIA (transient ischemic attack)    Voice tremor     Past Surgical History:  Procedure Laterality Date   ABDOMINAL HYSTERECTOMY     APPENDECTOMY   1975   ARTERY BIOPSY Right 04/26/2016   Procedure: BIOPSY TEMPORAL ARTERY;  Surgeon: Vernie Murders, MD;  Location: [facility] ORS;  Service: ENT;  Laterality: Right;   CARDIAC CATHETERIZATION N/A 11/06/2015   Procedure: Left Heart Cath and Coronary Angiography;  Surgeon: Lamar Blinks, MD;  Location: [facility] INVASIVE CV LAB;  Service: Cardiovascular;  Laterality: N/A;   CESAREAN SECTION     COLONOSCOPY  2015   COLONOSCOPY WITH PROPOFOL N/A 11/03/2018   Procedure: COLONOSCOPY WITH PROPOFOL;  Surgeon: Pasty Spillers, MD;  Location: [facility] ENDOSCOPY;  Service: Endoscopy;  Laterality: N/A;   CORONARY ANGIOPLASTY  04/2017   [facility]   CORONARY ARTERY BYPASS GRAFT N/A 11/10/2015   Procedure: CORONARY ARTERY BYPASS GRAFTING (CABG), ON PUMP, TIMES FOUR, USING LEFT INTERNAL MAMMARY ARTERY, BILATERAL GREATER SAPHENOUS VEINS HARVESTED ENDOSCOPICALLY;  Surgeon: Delight Ovens, MD;  Location: [facility] OR;  Service: Open Heart Surgery;  Laterality: N/A;  LIMA-LAD; SEQ SVG-OM1-OM2; SVG-PL   CORONARY STENT INTERVENTION N/A 08/05/2016   Procedure: Coronary Stent Intervention;  Surgeon: Marcina Millard, MD;  Location: [facility] INVASIVE CV LAB;  Service: Cardiovascular;  Laterality: N/A;   ESOPHAGOGASTRODUODENOSCOPY (EGD) WITH PROPOFOL N/A 11/03/2018   Procedure: ESOPHAGOGASTRODUODENOSCOPY (EGD) WITH PROPOFOL;  Surgeon: Pasty Spillers, MD;  Location: [facility] ENDOSCOPY;  Service:  Endoscopy;  Laterality: N/A;   HYSTERECTOMY ABDOMINAL WITH SALPINGO-OOPHORECTOMY Bilateral 08/15/2017   Procedure: HYSTERECTOMY ABDOMINAL WITH BILATERAL SALPINGO-OOPHORECTOMY;  Surgeon: Hildred Laser, MD;  Location: [facility] ORS;  Service: Gynecology;  Laterality: Bilateral;   LEFT HEART CATH AND CORONARY ANGIOGRAPHY N/A 08/05/2016   Procedure: Left Heart Cath and Coronary Angiography;  Surgeon: Marcina Millard, MD;  Location: [facility] INVASIVE CV LAB;  Service: Cardiovascular;  Laterality: N/A;   LEFT HEART CATH AND CORS/GRAFTS ANGIOGRAPHY  N/A 09/19/2018   Procedure: LEFT HEART CATH AND CORS/GRAFTS ANGIOGRAPHY;  Surgeon: Lamar Blinks, MD;  Location: [facility] INVASIVE CV LAB;  Service: Cardiovascular;  Laterality: N/A;   LEFT HEART CATH AND CORS/GRAFTS ANGIOGRAPHY N/A 08/30/2019   Procedure: LEFT HEART CATH AND CORS/GRAFTS ANGIOGRAPHY;  Surgeon: Dalia Heading, MD;  Location: [facility] INVASIVE CV LAB;  Service: Cardiovascular;  Laterality: N/A;   OOPHORECTOMY     TEE WITHOUT CARDIOVERSION N/A 11/10/2015   Procedure: TRANSESOPHAGEAL ECHOCARDIOGRAM (TEE);  Surgeon: Delight Ovens, MD;  Location: [facility] OR;  Service: Open Heart Surgery;  Laterality: N/A;   TUBAL LIGATION      Medications Prior to Admission  Medication Sig Dispense Refill Last Dose   acetaminophen (TYLENOL) 325 MG tablet Take 650 mg by mouth every 6 (six) hours as needed for moderate pain or headache.    prn   albuterol (VENTOLIN HFA) 108 (90 Base) MCG/ACT inhaler Inhale 2 puffs into the lungs every 6 (six) hours as needed for wheezing or shortness of breath. 8 g 2 prn   aspirin EC 81 MG tablet Take 81 mg by mouth daily. Swallow whole.   01/09/2023 at 0900   atorvastatin (LIPITOR) 80 MG tablet Take 1 tablet (80 mg total) by mouth daily. 30 tablet 0 01/09/2023 at 0900   Dulaglutide (TRULICITY) 1.5 MG/0.5ML SOPN Inject 1.5 mg into the skin once a week. 6 mL 1 01/05/2023   ELIQUIS 5 MG TABS tablet Take 5 mg by mouth 2 (two) times daily.   01/09/2023 at 2030   ezetimibe (ZETIA) 10 MG tablet Take 1 tablet (10 mg total) by mouth daily. 90 tablet 1 01/09/2023 at 2030   insulin glargine (LANTUS SOLOSTAR) 100 UNIT/ML Solostar Pen Inject 12 Units into the skin at bedtime. (Patient taking differently: Inject 16 Units into the skin at bedtime.) 18 mL 3 01/09/2023 at 2030   isosorbide mononitrate (IMDUR) 120 MG 24 hr tablet Take 120 mg by mouth daily.   01/09/2023 at 0900   JARDIANCE 10 MG TABS tablet Take 25 mg by mouth every morning.   01/09/2023 at 0900   loperamide (IMODIUM) 2 MG  capsule Take 2 mg by mouth daily as needed for diarrhea or loose stools.   prn   nitroGLYCERIN (NITROSTAT) 0.4 MG SL tablet Place 1 tablet (0.4 mg total) under the tongue every 5 (five) minutes as needed for chest pain. 30 tablet 0 prn   nystatin (NYSTATIN) powder Apply 1 application topically daily. 60 g 5 01/09/2023   ondansetron (ZOFRAN-ODT) 8 MG disintegrating tablet TAKE ONE TABLET BY MOUTH EVERY 8 HOURS AS NEEDED FOR NAUSEA AND VOMITING 30 tablet 1 prn   pantoprazole (PROTONIX) 20 MG tablet Take 1 tablet (20 mg total) by mouth 2 (two) times daily. 60 tablet 11 01/09/2023 at 2030   propranolol ER (INDERAL LA) 60 MG 24 hr capsule Take 60 mg by mouth daily.   01/09/2023 at 0900   ranolazine (RANEXA) 1000 MG SR tablet Take 1 tablet (1,000 mg total) by mouth 2 (two)  times daily. 60 tablet 0 01/09/2023 at 2030   sertraline (ZOLOFT) 100 MG tablet Take 1 tablet (100 mg total) by mouth daily. (Patient taking differently: Take 100 mg by mouth at bedtime.) 90 tablet 1 01/09/2023 at 2030   Blood Glucose Monitoring Suppl (ONETOUCH VERIO) w/Device KIT Use daily to check blood sugar 1 kit 0    COMFORT EZ PEN NEEDLES 32G X 4 MM MISC Use to inject insulin daily 100 each 1    glucose blood test strip Use to check blood sugars daily as instructed 100 each 12    isosorbide mononitrate (IMDUR) 60 MG 24 hr tablet Take 1 tablet (60 mg total) by mouth daily. (Patient not taking: Reported on 01/10/2023) 30 tablet 0 Not Taking   Lancets (ONETOUCH DELICA PLUS LANCET33G) MISC USE UP TO 4 TIMES DAILY AS DIRECTED 100 each 0    [DISCONTINUED] metoprolol tartrate (LOPRESSOR) 50 MG tablet Take 50 mg by mouth 2 (two) times daily. (Patient not taking: Reported on 01/10/2023)   Not Taking   Social History   Socioeconomic History   Marital status: Divorced    Spouse name: Not on file   Number of children: 3   Years of education: Not on file   Highest education level: Some college, no degree  Occupational History    Occupation: retired  Tobacco Use   Smoking status: Former    Current packs/day: 0.00    Types: Cigarettes    Quit date: 10/07/2001    Years since quitting: 21.2   Smokeless tobacco: Never  Vaping Use   Vaping status: Never Used  Substance and Sexual Activity   Alcohol use: No    Alcohol/week: 0.0 standard drinks of alcohol   Drug use: No   Sexual activity: Not Currently  Other Topics Concern   Not on file  Social History Narrative   Lives at home alone   Social Determinants of Health   Financial Resource Strain: Low Risk  (03/26/2022)   Overall Financial Resource Strain (CARDIA)    Difficulty of Paying Living Expenses: Not hard at all  Food Insecurity: No Food Insecurity (06/30/2022)   Hunger Vital Sign    Worried About Running Out of Food in the Last Year: Never true    Ran Out of Food in the Last Year: Never true  Transportation Needs: No Transportation Needs (06/30/2022)   PRAPARE - Administrator, Civil Service (Medical): No    Lack of Transportation (Non-Medical): No  Physical Activity: Insufficiently Active (08/10/2021)   Exercise Vital Sign    Days of Exercise per Week: 2 days    Minutes of Exercise per Session: 20 min  Stress: No Stress Concern Present (03/26/2022)   [employer] of Occupational Health - Occupational Stress Questionnaire    Feeling of Stress : Not at all  Social Connections: Moderately Integrated (03/26/2022)   Social Connection and Isolation Panel [NHANES]    Frequency of Communication with Friends and Family: More than three times a week    Frequency of Social Gatherings with Friends and Family: Twice a week    Attends Religious Services: More than 4 times per year    Active Member of [employer] or Organizations: Yes    Attends Banker Meetings: Never    Marital Status: Divorced  Catering manager Violence: Not At Risk (06/30/2022)   Humiliation, Afraid, Rape, and Kick questionnaire    Fear of Current or Ex-Partner: No     Emotionally Abused: No  Physically Abused: No    Sexually Abused: No    Family History  Problem Relation Age of Onset   Cancer Father    Hypertension Father    Heart disease Father    Asthma Father    Cancer Mother    Hypertension Mother    Pancreatic cancer Mother 75   Cancer Sister    Breast cancer Sister 26   Breast cancer Sister 36   Lung cancer Brother    Pancreatic cancer Sister 22   Cancer Sister      Vitals:   01/11/23 2009 01/11/23 2226 01/12/23 0417 01/12/23 0724  BP: 111/61 132/65 (!) 147/81 (!) 177/99  Pulse: 77 68 68 79  Resp: 16 18 18 16   Temp: 98.4 F (36.9 C) 98.3 F (36.8 C) 97.8 F (36.6 C) (!) 97.3 F (36.3 C)  TempSrc: Oral Oral Oral   SpO2: 96% 94% 100% 97%  Weight:      Height:        PHYSICAL EXAM General: Chronically ill-appearing, well nourished, in no acute distress upright in bedside chair. HEENT: Normocephalic and atraumatic. Neck: No JVD.  Lungs: Normal respiratory effort on room air. Clear bilaterally to auscultation. No wheezes, crackles, rhonchi.  Heart: HRRR. Normal S1 and S2 without gallops or murmurs.  Abdomen: Non-distended appearing.  Msk: Normal strength and tone for age. Extremities: Warm and well perfused. No clubbing, cyanosis.  No edema.  Neuro: Alert and oriented X 3. Psych: Answers questions appropriately.   Labs: Basic Metabolic Panel: Recent Labs    01/11/23 0417 01/12/23 0243  NA 139 141  K 3.5 3.3*  CL 106 109  CO2 25 25  GLUCOSE 94 122*  BUN 15 12  CREATININE 0.95 0.83  CALCIUM 8.4* 8.4*   Liver Function Tests: Recent Labs    01/10/23 0933  AST 17  ALT 15  ALKPHOS 130*  BILITOT 0.6  PROT 7.1  ALBUMIN 3.9   No results for input(s): "LIPASE", "AMYLASE" in the last 72 hours. CBC: Recent Labs    01/11/23 0417 01/12/23 0243  WBC 6.5 5.3  NEUTROABS  --  1.9  HGB 11.4* 11.3*  HCT 35.1* 34.6*  MCV 102.3* 101.5*  PLT 172 167   Cardiac Enzymes: Recent Labs    01/10/23 0933  01/10/23 1144  CKTOTAL 55  --   TROPONINIHS 7 8   BNP: No results for input(s): "BNP" in the last 72 hours. D-Dimer: No results for input(s): "DDIMER" in the last 72 hours. Hemoglobin A1C: No results for input(s): "HGBA1C" in the last 72 hours. Fasting Lipid Panel: Recent Labs    01/11/23 0417  CHOL 160  HDL 59  LDLCALC 83  TRIG 91  CHOLHDL 2.7   Thyroid Function Tests: No results for input(s): "TSH", "T4TOTAL", "T3FREE", "THYROIDAB" in the last 72 hours.  Invalid input(s): "FREET3" Anemia Panel: No results for input(s): "VITAMINB12", "FOLATE", "FERRITIN", "TIBC", "IRON", "RETICCTPCT" in the last 72 hours.   Radiology: CT LUMBAR SPINE WO CONTRAST  Result Date: 01/10/2023 CLINICAL DATA:  Low back pain. Tenderness over T9-T11 and L2-L3 area. EXAM: CT THORACIC AND LUMBAR SPINE WITHOUT CONTRAST TECHNIQUE: Multidetector CT imaging of the thoracic and lumbar spine was performed without contrast. Multiplanar CT image reconstructions were also generated. RADIATION DOSE REDUCTION: This exam was performed according to the departmental dose-optimization program which includes automated exposure control, adjustment of the mA and/or kV according to patient size and/or use of iterative reconstruction technique. COMPARISON:  CT abdomen pelvis dated 07/01/2022 and  chest CT dated 08/11/2021. FINDINGS: Evaluation for fracture is limited due to osteopenia. CT THORACIC SPINE FINDINGS Alignment: No acute subluxation. Vertebrae: No acute fracture.  Osteopenia. Paraspinal and other soft tissues: No perispinal fluid collection. There is coronary vascular calcification. Atherosclerotic calcification of the aorta. Disc levels: No acute findings.  Degenerative changes. CT LUMBAR SPINE FINDINGS Segmentation: 5 lumbar type vertebrae. Alignment: No acute subluxation. Vertebrae: No acute fracture. The bones are osteopenic which limits evaluation for fracture. Paraspinal and other soft tissues: No paraspinal fluid  collection. Atherosclerotic calcification of the aorta. Disc levels: No acute findings. Multilevel degenerative changes with disc desiccation and vacuum phenomena. IMPRESSION: 1. No acute/traumatic thoracic or lumbar spine pathology. 2. Osteopenia. 3.  Aortic Atherosclerosis (ICD10-I70.0). Electronically Signed   By: Elgie Collard M.D.   On: 01/10/2023 23:22   CT THORACIC SPINE WO CONTRAST  Result Date: 01/10/2023 CLINICAL DATA:  Low back pain. Tenderness over T9-T11 and L2-L3 area. EXAM: CT THORACIC AND LUMBAR SPINE WITHOUT CONTRAST TECHNIQUE: Multidetector CT imaging of the thoracic and lumbar spine was performed without contrast. Multiplanar CT image reconstructions were also generated. RADIATION DOSE REDUCTION: This exam was performed according to the departmental dose-optimization program which includes automated exposure control, adjustment of the mA and/or kV according to patient size and/or use of iterative reconstruction technique. COMPARISON:  CT abdomen pelvis dated 07/01/2022 and chest CT dated 08/11/2021. FINDINGS: Evaluation for fracture is limited due to osteopenia. CT THORACIC SPINE FINDINGS Alignment: No acute subluxation. Vertebrae: No acute fracture.  Osteopenia. Paraspinal and other soft tissues: No perispinal fluid collection. There is coronary vascular calcification. Atherosclerotic calcification of the aorta. Disc levels: No acute findings.  Degenerative changes. CT LUMBAR SPINE FINDINGS Segmentation: 5 lumbar type vertebrae. Alignment: No acute subluxation. Vertebrae: No acute fracture. The bones are osteopenic which limits evaluation for fracture. Paraspinal and other soft tissues: No paraspinal fluid collection. Atherosclerotic calcification of the aorta. Disc levels: No acute findings. Multilevel degenerative changes with disc desiccation and vacuum phenomena. IMPRESSION: 1. No acute/traumatic thoracic or lumbar spine pathology. 2. Osteopenia. 3.  Aortic Atherosclerosis  (ICD10-I70.0). Electronically Signed   By: Elgie Collard M.D.   On: 01/10/2023 23:22   DG Sacrum/Coccyx  Result Date: 01/10/2023 CLINICAL DATA:  Sacral pain after fall EXAM: SACRUM AND COCCYX - 2+ VIEW COMPARISON:  None Available. FINDINGS: There is no evidence of fracture or other focal bone lesions. IMPRESSION: Negative. Electronically Signed   By: Jasmine Pang M.D.   On: 01/10/2023 22:47   DG Chest Port 1 View  Result Date: 01/10/2023 CLINICAL DATA:  Weakness chest pain EXAM: PORTABLE CHEST 1 VIEW COMPARISON:  06/30/2022 FINDINGS: Post sternotomy changes. No acute airspace disease or effusion. Stable cardiomediastinal silhouette. No pneumothorax IMPRESSION: No active disease. Electronically Signed   By: Jasmine Pang M.D.   On: 01/10/2023 22:46   MR BRAIN WO CONTRAST  Result Date: 01/10/2023 CLINICAL DATA:  Stroke suspected, dizziness and blurred vision, now resolved, profound weakness with fall EXAM: MRI HEAD WITHOUT CONTRAST TECHNIQUE: Multiplanar, multiecho pulse sequences of the brain and surrounding structures were obtained without intravenous contrast. COMPARISON:  02/11/2022 MRI head, correlation is also made with 01/10/2023 CT head FINDINGS: Brain: No restricted diffusion to suggest acute or subacute infarct. No acute hemorrhage, mass, mass effect, or midline shift. No hydrocephalus or extra-axial collection. Normal pituitary and craniocervical junction. No hemosiderin deposition to suggest remote hemorrhage. Multiple remote lacunar infarcts in the basal ganglia and right cerebellar lobe. T2 hyperintense signal in the periventricular white matter,  likely the sequela of chronic small vessel ischemic disease. Vascular: Normal arterial flow voids. Skull and upper cervical spine: Normal marrow signal. Sinuses/Orbits: Mild mucosal thickening in the ethmoid air cells and inferior left frontal sinus. No acute finding in the orbits. Other: The mastoid air cells are well aerated. IMPRESSION: No  acute intracranial process. No evidence of acute or subacute infarct. Electronically Signed   By: Wiliam Ke M.D.   On: 01/10/2023 22:23   CT Head Wo Contrast  Result Date: 01/10/2023 CLINICAL DATA:  72 year old female with chest pain, headache, fall, found down. EXAM: CT HEAD WITHOUT CONTRAST TECHNIQUE: Contiguous axial images were obtained from the base of the skull through the vertex without intravenous contrast. RADIATION DOSE REDUCTION: This exam was performed according to the departmental dose-optimization program which includes automated exposure control, adjustment of the mA and/or kV according to patient size and/or use of iterative reconstruction technique. COMPARISON:  Brain MRI 02/11/2022.  Head CT 01/01/2022. FINDINGS: Brain: Cerebral volume is within normal limits for age. No midline shift, ventriculomegaly, mass effect, evidence of mass lesion, intracranial hemorrhage or evidence of cortically based acute infarction. Chronic bilateral basal ganglia lacunar infarcts. Chronic encephalomalacia in the body of the corpus callosum. Stable gray-white matter differentiation throughout the brain. Vascular: Calcified atherosclerosis at the skull base. No suspicious intracranial vascular hyperdensity. Skull: Stable, intact. Sinuses/Orbits: Visualized paranasal sinuses and mastoids are stable and well aerated. Other: No orbit or scalp soft tissue injury identified. IMPRESSION: 1. No acute intracranial abnormality or acute traumatic injury identified. 2. Stable non contrast CT appearance of advanced chronic small vessel disease. Electronically Signed   By: Odessa Fleming M.D.   On: 01/10/2023 11:37    ECHO 06/2022: 1. Left ventricular ejection fraction, by estimation, is 65 to 70%. The left ventricle has normal function. The left ventricle has no regional wall motion abnormalities. There is mild concentric left ventricular hypertrophy. Left ventricular diastolic  parameters are consistent with Grade I  diastolic dysfunction (impaired relaxation).   2. Right ventricular systolic function is mildly reduced. The right ventricular size is mildly enlarged. Mildly increased right ventricular wall thickness.   3. Left atrial size was mildly dilated.   4. Right atrial size was mildly dilated.   5. The mitral valve is grossly normal. Trivial mitral valve regurgitation.   6. The aortic valve is normal in structure. Aortic valve regurgitation is trivial. Aortic valve sclerosis is present, with no evidence of aortic valve stenosis.   TELEMETRY reviewed by me 01/12/2023: sinus rhythm rate 80s  EKG reviewed by me: sinus rhythm 1st degree AVB, LBBB rate 76 bpm  Data reviewed by me 01/12/2023: last 24h vitals tele labs imaging I/O hospitalist progress note  Principal Problem:   Syncope Active Problems:   Chest pain   Essential hypertension   Gastroesophageal reflux disease   Mixed hyperlipidemia   Coronary artery disease   S/P CABG x 4   Type 2 diabetes mellitus with diabetic polyneuropathy, with long-term current use of insulin (HCC)   Tremors of nervous system   Mild obstructive sleep apnea   COPD suggested by initial evaluation (HCC)   TIA (transient ischemic attack)   Paroxysmal atrial fibrillation (HCC)   Obesity (BMI 30-39.9)   Back pain    ASSESSMENT AND PLAN:  Candace [name] is a 72 y.o. female  with a past medical history of severe 3vCAD s/p CABG x 4 (atretic LIMA-LAD, occluded SVG to OM, patent SVG to RCA, with -70-80% mid LAD, 60-70% diffuse  LCx disease (small vessel) and 90% distal RCA by [facility] 07/11/2020), HFpEF (EF 65-70%, G1 DD 06/2022), paroxysmal atrial fibrillation (Eliquis), hx TIA, DM2, essential tremor who presented to the ED on 01/10/2023 for weakness, fall, chest pain. Cardiology was consulted for further evaluation.   # Syncope # Orthostatic hypotension # Paroxysmal atrial fibrillation Patient with episode of fall and subsequent syncope overnight while going to the  bathroom.  Also with reported shaking with this episode. Had residual weakness afterwards. CT head and MRI brain unremarkable. No evidence of arrhythmia on telemetry, has known hx of AF. -14 day monitor in place.  -Continue eliquis 5 mg twice daily.  -Continue home propranolol 60 mg daily.   # Chest pain # Coronary artery disease s/p CABG x4 2017 Patient with history of coronary artery disease as detailed in assessment above with reports of chest pain prior to her episode of syncope.  Continues to report discomfort, states this has been ongoing for many weeks.  Reportedly has not been taking her home isosorbide. Troponins 7 > 8. -Increase isosorbide mononitrate to 120 mg daily.  Continue Ranexa 1000 mg twice daily. -Continue lovastatin 80 mg daily, Zetia 10 mg daily, aspirin 81 mg daily.  Ok for discharge today from a cardiac perspective. Will arrange for follow up in clinic with Dr. Juliann Pares in 1-2 weeks.    This patient's plan of care was discussed and created with Dr. Melton Alar and she is in agreement.  Signed: Gale Journey, PA-C  01/12/2023, 9:43 AM [facility] Cardiology

## 2023-01-12 NOTE — [place] FL2 (Signed)
[place] MEDICAID FL2 LEVEL OF CARE FORM     IDENTIFICATION  Patient Name: Candace [name] Birthdate: 07-31-1950 Sex: female Admission Date (Current Location): 01/10/2023  [facility] and [place] Number:  Chiropodist and Address:  [facility], [address] [place], [place] [address]      Provider Number: [id number]  Attending Physician Name and Address:  Sunnie Nielsen, DO  Relative Name and Phone Number:  Cordella Register (Daughter)  [phone] [facility])    Current Level of Care: Hospital Recommended Level of Care: Skilled Nursing Facility Prior Approval Number:    Date Approved/Denied:   PASRR Number: [id number]  Discharge Plan: Home    Current Diagnoses: Patient Active Problem List   Diagnosis Date Noted   Syncope 01/10/2023   Back pain 01/10/2023   DNR (do not resuscitate) 06/30/2022   Suprapubic abdominal pain 06/30/2022   Blurred vision, right eye 01/08/2022   History of vertigo 08/16/2021   Obesity (BMI 30-39.9) 08/14/2021   Atrial fibrillation with RVR (HCC) 08/13/2021   Hypokalemia 08/12/2021   COVID-19 08/11/2021   Orthostatic hypotension 08/11/2021   Left hip pain 06/30/2021   Paroxysmal atrial fibrillation (HCC) 07/10/2020   History of coronary artery bypass graft 07/10/2020   Unsteady gait    Acute bronchitis 03/07/2019   Esophageal dysphagia    Stomach irritation    Gastric polyp    Esophageal lump    Hx of colonic polyp    Polyp of colon    Diverticulosis of large intestine without diverticulitis    PSVT (paroxysmal supraventricular tachycardia) (HCC) 10/03/2018   Abnormal ECG 07/05/2018   Tachycardia 03/27/2018   Pain in limb 02/28/2018   Chronic diastolic CHF (congestive heart failure) (HCC) 11/03/2017   TIA (transient ischemic attack) 11/03/2017   COPD suggested by initial evaluation (HCC) 08/23/2017   Incidental pulmonary nodule 08/01/2017   Non-ST elevation myocardial infarction (NSTEMI),  subendocardial infarction, subsequent episode of care (HCC) 06/01/2017   B12 deficiency 12/01/2016   Vitamin D deficiency 11/04/2016   Depression with anxiety 09/30/2016   Generalized weakness 09/30/2016   Tremors of nervous system 09/30/2016   Mild obstructive sleep apnea 09/30/2016   Headache 08/18/2016   Unstable angina (HCC) 08/03/2016   Type 2 diabetes mellitus with diabetic polyneuropathy, with long-term current use of insulin (HCC) 08/03/2016   Chronic daily headache 05/05/2016   Asthma 11/11/2015   S/P CABG x 4 11/10/2015   Presence of aortocoronary bypass graft 11/10/2015   Coronary artery disease 11/07/2015   Carotid artery narrowing 02/08/2014   Chest pain 02/08/2014   Mixed hyperlipidemia 02/08/2014   Bilateral carotid artery stenosis 02/08/2014   Atypical chest pain 02/07/2014   Essential hypertension 02/07/2014   Awareness of heartbeats 02/07/2014   Diarrhea 10/05/2013   Gastroesophageal reflux disease 10/05/2013    Orientation RESPIRATION BLADDER Height & Weight     Self, Time, Situation, Place  Normal Continent Weight: 74.8 kg Height:  5\' 2"  (157.5 cm)  BEHAVIORAL SYMPTOMS/MOOD NEUROLOGICAL BOWEL NUTRITION STATUS  Other (Comment) (n/a)  (n/a) Continent Diet (Heart)  AMBULATORY STATUS COMMUNICATION OF NEEDS Skin   Limited Assist Verbally Normal                       Personal Care Assistance Level of Assistance  Bathing, Dressing Bathing Assistance: Limited assistance   Dressing Assistance: Limited assistance     Functional Limitations Info             SPECIAL CARE  FACTORS FREQUENCY  PT (By licensed PT), OT (By licensed OT)     PT Frequency: Min 2x weekly OT Frequency: Min 2x weekly            Contractures Contractures Info: Not present    Additional Factors Info  Code Status, Allergies Code Status Info: FULL Allergies Info: Tramadol, Lisinopril, Penicillins           Current Medications (01/12/2023):  This is the current  hospital active medication list Current Facility-Administered Medications  Medication Dose Route Frequency Provider Last Rate Last Admin    stroke: early stages of recovery book   Does not apply Once Cox, Amy N, DO       acetaminophen (TYLENOL) tablet 650 mg  650 mg Oral Q4H PRN Cox, Amy N, DO   650 mg at 01/12/23 1610   Or   acetaminophen (TYLENOL) 160 MG/5ML solution 650 mg  650 mg Per Tube Q4H PRN Cox, Amy N, DO       Or   acetaminophen (TYLENOL) suppository 650 mg  650 mg Rectal Q4H PRN Cox, Amy N, DO       albuterol (PROVENTIL) (2.5 MG/3ML) 0.083% nebulizer solution 2.5 mg  2.5 mg Nebulization Q6H PRN Belue, Nathan S, RPH       ALPRAZolam Prudy Feeler) tablet 0.25 mg  0.25 mg Oral TID PRN Cox, Amy N, DO       apixaban (ELIQUIS) tablet 5 mg  5 mg Oral BID Cox, Amy N, DO   5 mg at 01/12/23 0915   aspirin EC tablet 81 mg  81 mg Oral Daily Cox, Amy N, DO   81 mg at 01/12/23 0915   atorvastatin (LIPITOR) tablet 80 mg  80 mg Oral Daily Cox, Amy N, DO   80 mg at 01/12/23 0915   ezetimibe (ZETIA) tablet 10 mg  10 mg Oral QHS Otelia Sergeant, RPH   10 mg at 01/11/23 2119   insulin aspart (novoLOG) injection 0-15 Units  0-15 Units Subcutaneous TID WC Cox, Amy N, DO   8 Units at 01/12/23 1255   insulin aspart (novoLOG) injection 0-5 Units  0-5 Units Subcutaneous QHS Cox, Amy N, DO       insulin glargine-yfgn (SEMGLEE) injection 12 Units  12 Units Subcutaneous QHS Cox, Amy N, DO   12 Units at 01/11/23 2120   [START ON 01/13/2023] isosorbide mononitrate (IMDUR) 24 hr tablet 30 mg  30 mg Oral Daily Alexander, Natalie, DO       lidocaine (LIDODERM) 5 % 2 patch  2 patch Transdermal Daily PRN Cox, Amy N, DO       nitroGLYCERIN (NITROSTAT) SL tablet 0.4 mg  0.4 mg Sublingual Q5 min PRN Cox, Amy N, DO       ondansetron (ZOFRAN) injection 4 mg  4 mg Intravenous Q6H PRN Cox, Amy N, DO   4 mg at 01/12/23 0926   pantoprazole (PROTONIX) EC tablet 20 mg  20 mg Oral BID Cox, Amy N, DO   20 mg at 01/12/23 0915    propranolol ER (INDERAL LA) 24 hr capsule 60 mg  60 mg Oral Daily Hudson, Caralyn, PA-C   60 mg at 01/12/23 0915   ranolazine (RANEXA) 12 hr tablet 1,000 mg  1,000 mg Oral BID Cox, Amy N, DO   1,000 mg at 01/12/23 0914   senna-docusate (Senokot-S) tablet 1 tablet  1 tablet Oral QHS PRN Cox, Amy N, DO       sertraline (ZOLOFT) tablet 100 mg  100 mg Oral QHS Cox, Amy N, DO   100 mg at 01/11/23 2124     Discharge Medications: Please see discharge summary for a list of discharge medications.  Relevant Imaging Results:  Relevant Lab Results:   Additional Information SSN# [id number]  Truddie Hidden, RN

## 2023-01-12 NOTE — Progress Notes (Signed)
PROGRESS NOTE    Candace [name]   ZOX:[id number] DOB: 01/12/1951  DOA: 01/10/2023 Date of Service: 01/12/23 which is hospital day 1  PCP: Bosie Clos, MD    HPI: Candace [name] is a 72 year old female with history of hyperlipidemia, insulin-dependent diabetes mellitus, hypertension, GERD, depression, anxiety, on anticoagulation, CAD status post CABG LIMA to LAD, SVG to PDA and OM, DES to SVG in 2021, and balloon angioplasty at outside hospital in 2023, history of CVA, paroxysmal atrial fibrillation, on Eliquis, who presents emergency department for chief concerns of chest pain and increased falling.   Hospital course / significant events:  10/14: admitted to hospitalist service, syncope d/t orthostatic hypotension, question arrhythmia. Troponin trended negative, CP resolved. MRI brain no concerns. Imaging T/L-spine and sacrum/coccyx no concerns.  10/15: PT/OT eval, recs for HH. Cardiology recs for 14-day cardiac monitoring, meds as below  10/16: significant orthostatic drop BP standing w/ dizziness symptoms. PT/OT update recs for SNF. Lowered dose isosorbide   Consultants:  Cardiology   Procedures/Surgeries: none      ASSESSMENT & PLAN:   Syncope secondary to orthostatic hypotension, multiple risk factors  W/u possible arrhythmia Zio patch have been placed for monitoring and for outpatient follow-up PT OT work with patient and patient's blood pressure was positive for orthostatic drop Reduced isosorbide given orthostatic hypotension    Coronary artery disease S/P CABG x 4 Chest pain-resolved High sensitive troponin were negative x 2. Echo done on 07/22/2022: Showed EF estimated ejection fraction 65 to 70%.  Grade 1 diastolic dysfunction. Continue cardiac monitoring Home Ranexa 1000 mg p.o. twice daily and propranolol 60 mg daily resumed Of note, propranolol Rx d/t tremor but it doesn't seem to have helped her tremor, suspect parkinsonism  Continue atorvastatin 80  mg daily, ezetimibe 10 mg daily, aspirin 81 mg daily, Eliquis 5 mg p.o. twice daily Reduced isosorbide given orthostatic hypotension    Type 2 diabetes mellitus with diabetic polyneuropathy, with long-term current use of insulin (HCC) Patient takes Jardiance 10 mg daily, Trulicity 1.5 mg subcutaneous injection weekly, Lantus 100 units subcutaneous nightly Continue insulin SSI with at bedtime coverage ordered Goal inpatient blood glucose levels 140-180   Paroxysmal atrial fibrillation (HCC) Continue Eliquis  Back pain Query secondary to fall CT scan of the lumbar and thoracic spine all within normal limit with no fractures Continue as needed pain medication    Mild obstructive sleep apnea Continue CPAP nightly    Parkinson syndrome Can also contribute to unsteady gait / fall risk Continue outpatient follow-up    Hyperlipidemia Continue atorvastatin 80 mg daily   Gastroesophageal reflux disease Protonix 20 mg p.o. twice daily resumed    obesity based on BMI: Body mass index is 30.18 kg/m.   DVT prophylaxis: eliquis IV fluids: no continuous IV fluids  Nutrition: cardiac diet  Central lines / invasive devices: none  Code Status: FULL CODE ACP documentation reviewed: DNR order is on file in [facility] was voided after confirming FULL CODE w/ patient   TOC needs: SNF placement  Barriers to dispo / significant pending items: placement, monitro BP on lower isosorbide              Subjective / Brief ROS:  Patient reports she wants to know why she has these falls and weakness - we spent a great deal of time gong over her diagnoses Denies CP/SOB.  Pain controlled.  Denies new weakness.  Tolerating diet.  Reports no concerns w/ urination/defecation.   Family  Communication: daughter at bedside on rounds     Objective Findings:  Vitals:   01/12/23 0417 01/12/23 0724 01/12/23 1103 01/12/23 1200  BP: (!) 147/81 (!) 177/99 (!) 144/73   Pulse: 68 79 71   Resp: 18 16  20    Temp: 97.8 F (36.6 C) (!) 97.3 F (36.3 C) 98.2 F (36.8 C)   TempSrc: Oral  Oral   SpO2: 100% 97% 96% 100%  Weight:      Height:        Intake/Output Summary (Last 24 hours) at 01/12/2023 1505 Last data filed at 01/12/2023 1059 Gross per 24 hour  Intake 600 ml  Output 500 ml  Net 100 ml   Filed Weights   01/10/23 0910  Weight: 74.8 kg    Examination:  Physical Exam Constitutional:      General: She is not in acute distress. Cardiovascular:     Rate and Rhythm: Normal rate and regular rhythm.  Pulmonary:     Effort: Pulmonary effort is normal.     Breath sounds: Normal breath sounds.  Abdominal:     Palpations: Abdomen is soft.  Musculoskeletal:     Right lower leg: No edema.     Left lower leg: No edema.  Neurological:     Mental Status: She is alert.     Motor: Tremor (resting tremor occasionally pill-rolling, head tremor at rest) present.     Gait: Gait abnormal.     Comments: Speech is slow, fairly flat facies            Scheduled Medications:    stroke: early stages of recovery book   Does not apply Once   apixaban  5 mg Oral BID   aspirin EC  81 mg Oral Daily   atorvastatin  80 mg Oral Daily   butalbital-acetaminophen-caffeine  1 tablet Oral Once   ezetimibe  10 mg Oral QHS   insulin aspart  0-15 Units Subcutaneous TID WC   insulin aspart  0-5 Units Subcutaneous QHS   insulin glargine-yfgn  12 Units Subcutaneous QHS   [START ON 01/13/2023] isosorbide mononitrate  30 mg Oral Daily   pantoprazole  20 mg Oral BID   propranolol ER  60 mg Oral Daily   ranolazine  1,000 mg Oral BID   sertraline  100 mg Oral QHS    Continuous Infusions:   PRN Medications:  acetaminophen **OR** acetaminophen (TYLENOL) oral liquid 160 mg/5 mL **OR** acetaminophen, albuterol, ALPRAZolam, lidocaine, nitroGLYCERIN, ondansetron (ZOFRAN) IV, senna-docusate  Antimicrobials from admission:  Anti-infectives (From admission, onward)    None            Data Reviewed:  I have personally reviewed the following...  CBC: Recent Labs  Lab 01/10/23 0933 01/11/23 0417 01/12/23 0243  WBC 8.3 6.5 5.3  NEUTROABS  --   --  1.9  HGB 12.5 11.4* 11.3*  HCT 38.6 35.1* 34.6*  MCV 102.7* 102.3* 101.5*  PLT 182 172 167   Basic Metabolic Panel: Recent Labs  Lab 01/10/23 0933 01/11/23 0417 01/12/23 0243  NA 143 139 141  K 4.2 3.5 3.3*  CL 108 106 109  CO2 27 25 25   GLUCOSE 175* 94 122*  BUN 17 15 12   CREATININE 0.86 0.95 0.83  CALCIUM 8.7* 8.4* 8.4*   GFR: Estimated Creatinine Clearance: 58.9 mL/min (by C-G formula based on SCr of 0.83 mg/dL). Liver Function Tests: Recent Labs  Lab 01/10/23 0933  AST 17  ALT 15  ALKPHOS 130*  BILITOT  0.6  PROT 7.1  ALBUMIN 3.9   No results for input(s): "LIPASE", "AMYLASE" in the last 168 hours. No results for input(s): "AMMONIA" in the last 168 hours. Coagulation Profile: No results for input(s): "INR", "PROTIME" in the last 168 hours. Cardiac Enzymes: Recent Labs  Lab 01/10/23 0933  CKTOTAL 55   BNP (last 3 results) No results for input(s): "PROBNP" in the last 8760 hours. HbA1C: No results for input(s): "HGBA1C" in the last 72 hours. CBG: Recent Labs  Lab 01/11/23 1247 01/11/23 1731 01/11/23 2110 01/12/23 0725 01/12/23 1114  GLUCAP 133* 151* 133* 126* 252*   Lipid Profile: Recent Labs    01/11/23 0417  CHOL 160  HDL 59  LDLCALC 83  TRIG 91  CHOLHDL 2.7   Thyroid Function Tests: No results for input(s): "TSH", "T4TOTAL", "FREET4", "T3FREE", "THYROIDAB" in the last 72 hours. Anemia Panel: No results for input(s): "VITAMINB12", "FOLATE", "FERRITIN", "TIBC", "IRON", "RETICCTPCT" in the last 72 hours. Most Recent Urinalysis On File:     Component Value Date/Time   COLORURINE YELLOW (A) 01/10/2023 1620   APPEARANCEUR CLEAR (A) 01/10/2023 1620   APPEARANCEUR Clear 12/24/2014 0905   LABSPEC 1.027 01/10/2023 1620   LABSPEC 1.030 09/14/2013 2123   PHURINE  7.0 01/10/2023 1620   GLUCOSEU >=500 (A) 01/10/2023 1620   GLUCOSEU Negative 09/14/2013 2123   HGBUR NEGATIVE 01/10/2023 1620   BILIRUBINUR NEGATIVE 01/10/2023 1620   BILIRUBINUR Negative 02/06/2020 1014   BILIRUBINUR Negative 12/24/2014 0905   BILIRUBINUR Negative 09/14/2013 2123   KETONESUR NEGATIVE 01/10/2023 1620   PROTEINUR NEGATIVE 01/10/2023 1620   UROBILINOGEN 0.2 02/06/2020 1014   NITRITE NEGATIVE 01/10/2023 1620   LEUKOCYTESUR NEGATIVE 01/10/2023 1620   LEUKOCYTESUR 3+ 09/14/2013 2123   Sepsis Labs: @LABRCNTIP (procalcitonin:4,lacticidven:4) Microbiology: Recent Results (from the past 240 hour(s))  SARS Coronavirus 2 by RT PCR (hospital order, performed in [facility] hospital lab) *cepheid single result test* Anterior Nasal Swab     Status: None   Collection Time: 01/10/23 10:57 AM   Specimen: Anterior Nasal Swab  Result Value Ref Range Status   SARS Coronavirus 2 by RT PCR NEGATIVE NEGATIVE Final    Comment: (NOTE) SARS-CoV-2 target nucleic acids are NOT DETECTED.  The SARS-CoV-2 RNA is generally detectable in upper and lower respiratory specimens during the acute phase of infection. The lowest concentration of SARS-CoV-2 viral copies this assay can detect is 250 copies / mL. A negative result does not preclude SARS-CoV-2 infection and should not be used as the sole basis for treatment or other patient management decisions.  A negative result may occur with improper specimen collection / handling, submission of specimen other than nasopharyngeal swab, presence of viral mutation(s) within the areas targeted by this assay, and inadequate number of viral copies (<250 copies / mL). A negative result must be combined with clinical observations, patient history, and epidemiological information.  Fact Sheet for Patients:   RoadLapTop.co.za  Fact Sheet for Healthcare Providers: http://kim-miller.com/  This test is not yet  approved or  cleared by the [place] FDA and has been authorized for detection and/or diagnosis of SARS-CoV-2 by FDA under an Emergency Use Authorization (EUA).  This EUA will remain in effect (meaning this test can be used) for the duration of the COVID-19 declaration under Section 564(b)(1) of the Act, 21 U.S.C. section 360bbb-3(b)(1), unless the authorization is terminated or revoked sooner.  Performed at [facility], [address], [place], [place] [address]       Radiology Studies last  3 days: CT LUMBAR SPINE WO CONTRAST  Result Date: 01/10/2023 CLINICAL DATA:  Low back pain. Tenderness over T9-T11 and L2-L3 area. EXAM: CT THORACIC AND LUMBAR SPINE WITHOUT CONTRAST TECHNIQUE: Multidetector CT imaging of the thoracic and lumbar spine was performed without contrast. Multiplanar CT image reconstructions were also generated. RADIATION DOSE REDUCTION: This exam was performed according to the departmental dose-optimization program which includes automated exposure control, adjustment of the mA and/or kV according to patient size and/or use of iterative reconstruction technique. COMPARISON:  CT abdomen pelvis dated 07/01/2022 and chest CT dated 08/11/2021. FINDINGS: Evaluation for fracture is limited due to osteopenia. CT THORACIC SPINE FINDINGS Alignment: No acute subluxation. Vertebrae: No acute fracture.  Osteopenia. Paraspinal and other soft tissues: No perispinal fluid collection. There is coronary vascular calcification. Atherosclerotic calcification of the aorta. Disc levels: No acute findings.  Degenerative changes. CT LUMBAR SPINE FINDINGS Segmentation: 5 lumbar type vertebrae. Alignment: No acute subluxation. Vertebrae: No acute fracture. The bones are osteopenic which limits evaluation for fracture. Paraspinal and other soft tissues: No paraspinal fluid collection. Atherosclerotic calcification of the aorta. Disc levels: No acute findings. Multilevel degenerative changes  with disc desiccation and vacuum phenomena. IMPRESSION: 1. No acute/traumatic thoracic or lumbar spine pathology. 2. Osteopenia. 3.  Aortic Atherosclerosis (ICD10-I70.0). Electronically Signed   By: Elgie Collard M.D.   On: 01/10/2023 23:22   CT THORACIC SPINE WO CONTRAST  Result Date: 01/10/2023 CLINICAL DATA:  Low back pain. Tenderness over T9-T11 and L2-L3 area. EXAM: CT THORACIC AND LUMBAR SPINE WITHOUT CONTRAST TECHNIQUE: Multidetector CT imaging of the thoracic and lumbar spine was performed without contrast. Multiplanar CT image reconstructions were also generated. RADIATION DOSE REDUCTION: This exam was performed according to the departmental dose-optimization program which includes automated exposure control, adjustment of the mA and/or kV according to patient size and/or use of iterative reconstruction technique. COMPARISON:  CT abdomen pelvis dated 07/01/2022 and chest CT dated 08/11/2021. FINDINGS: Evaluation for fracture is limited due to osteopenia. CT THORACIC SPINE FINDINGS Alignment: No acute subluxation. Vertebrae: No acute fracture.  Osteopenia. Paraspinal and other soft tissues: No perispinal fluid collection. There is coronary vascular calcification. Atherosclerotic calcification of the aorta. Disc levels: No acute findings.  Degenerative changes. CT LUMBAR SPINE FINDINGS Segmentation: 5 lumbar type vertebrae. Alignment: No acute subluxation. Vertebrae: No acute fracture. The bones are osteopenic which limits evaluation for fracture. Paraspinal and other soft tissues: No paraspinal fluid collection. Atherosclerotic calcification of the aorta. Disc levels: No acute findings. Multilevel degenerative changes with disc desiccation and vacuum phenomena. IMPRESSION: 1. No acute/traumatic thoracic or lumbar spine pathology. 2. Osteopenia. 3.  Aortic Atherosclerosis (ICD10-I70.0). Electronically Signed   By: Elgie Collard M.D.   On: 01/10/2023 23:22   DG Sacrum/Coccyx  Result Date:  01/10/2023 CLINICAL DATA:  Sacral pain after fall EXAM: SACRUM AND COCCYX - 2+ VIEW COMPARISON:  None Available. FINDINGS: There is no evidence of fracture or other focal bone lesions. IMPRESSION: Negative. Electronically Signed   By: Jasmine Pang M.D.   On: 01/10/2023 22:47   DG Chest Port 1 View  Result Date: 01/10/2023 CLINICAL DATA:  Weakness chest pain EXAM: PORTABLE CHEST 1 VIEW COMPARISON:  06/30/2022 FINDINGS: Post sternotomy changes. No acute airspace disease or effusion. Stable cardiomediastinal silhouette. No pneumothorax IMPRESSION: No active disease. Electronically Signed   By: Jasmine Pang M.D.   On: 01/10/2023 22:46   MR BRAIN WO CONTRAST  Result Date: 01/10/2023 CLINICAL DATA:  Stroke suspected, dizziness and blurred vision, now resolved, profound  weakness with fall EXAM: MRI HEAD WITHOUT CONTRAST TECHNIQUE: Multiplanar, multiecho pulse sequences of the brain and surrounding structures were obtained without intravenous contrast. COMPARISON:  02/11/2022 MRI head, correlation is also made with 01/10/2023 CT head FINDINGS: Brain: No restricted diffusion to suggest acute or subacute infarct. No acute hemorrhage, mass, mass effect, or midline shift. No hydrocephalus or extra-axial collection. Normal pituitary and craniocervical junction. No hemosiderin deposition to suggest remote hemorrhage. Multiple remote lacunar infarcts in the basal ganglia and right cerebellar lobe. T2 hyperintense signal in the periventricular white matter, likely the sequela of chronic small vessel ischemic disease. Vascular: Normal arterial flow voids. Skull and upper cervical spine: Normal marrow signal. Sinuses/Orbits: Mild mucosal thickening in the ethmoid air cells and inferior left frontal sinus. No acute finding in the orbits. Other: The mastoid air cells are well aerated. IMPRESSION: No acute intracranial process. No evidence of acute or subacute infarct. Electronically Signed   By: Wiliam Ke M.D.   On:  01/10/2023 22:23   CT Head Wo Contrast  Result Date: 01/10/2023 CLINICAL DATA:  72 year old female with chest pain, headache, fall, found down. EXAM: CT HEAD WITHOUT CONTRAST TECHNIQUE: Contiguous axial images were obtained from the base of the skull through the vertex without intravenous contrast. RADIATION DOSE REDUCTION: This exam was performed according to the departmental dose-optimization program which includes automated exposure control, adjustment of the mA and/or kV according to patient size and/or use of iterative reconstruction technique. COMPARISON:  Brain MRI 02/11/2022.  Head CT 01/01/2022. FINDINGS: Brain: Cerebral volume is within normal limits for age. No midline shift, ventriculomegaly, mass effect, evidence of mass lesion, intracranial hemorrhage or evidence of cortically based acute infarction. Chronic bilateral basal ganglia lacunar infarcts. Chronic encephalomalacia in the body of the corpus callosum. Stable gray-white matter differentiation throughout the brain. Vascular: Calcified atherosclerosis at the skull base. No suspicious intracranial vascular hyperdensity. Skull: Stable, intact. Sinuses/Orbits: Visualized paranasal sinuses and mastoids are stable and well aerated. Other: No orbit or scalp soft tissue injury identified. IMPRESSION: 1. No acute intracranial abnormality or acute traumatic injury identified. 2. Stable non contrast CT appearance of advanced chronic small vessel disease. Electronically Signed   By: Odessa Fleming M.D.   On: 01/10/2023 11:37       Time spent: 50 min    Sunnie Nielsen, DO [facility] 01/12/2023, 3:05 PM    Dictation software may have been used to generate the above note. Typos may occur and escape review in typed/dictated notes. Please contact Dr Lyn Hollingshead directly for clarity if needed.  Staff may message me via secure chat in Epic  but this may not receive an immediate response,  please page me for urgent matters!  If 7PM-7AM,  please contact night coverage [email or web address]

## 2023-01-12 NOTE — Progress Notes (Signed)
Physical Therapy Treatment Patient Details Name: Candace [name] MRN: [id number] DOB: Jan 08, 1951 Today's Date: 01/12/2023   History of Present Illness 72 y/o female presented to ED on 01/10/23 for chest pain and fall. Admitted for possible syncope. PMH: HTN, T2DM, anxiety, depression, CAD s/p CABG, hx of CVA, Afib on Eliquis.    PT Comments  Patient continues to be limited by orthostatics, see below. Patient required CGA for sit to stand transfer from recliner but minA to maintain standing due to R lateral lean with standing >1 minute. Complaining of dizziness and requesting to sit down throughout standing attempt. Stood x 3-4 during session for BP readings, see below. Able to make short transfer to [facility] with RW but continued to complaining of dizziness. Updated discharge recommendations to reflect current functional level.   Orthostatic BPs  Sitting 123/59  Standing 106/65      If plan is discharge home, recommend the following: A little help with walking and/or transfers;A little help with bathing/dressing/bathroom;Assistance with cooking/housework;Assist for transportation;Help with stairs or ramp for entrance   Can travel by private vehicle     No  Equipment Recommendations  Rolling Konnar Ben (2 wheels)    Recommendations for Other Services       Precautions / Restrictions Precautions Precautions: Fall Precaution Comments: watch BP Restrictions Weight Bearing Restrictions: No     Mobility  Bed Mobility               General bed mobility comments: in recliner on arrival    Transfers Overall transfer level: Needs assistance Equipment used: Rolling Chaka Jefferys (2 wheels) Transfers: Sit to/from Stand Sit to Stand: Contact guard assist           General transfer comment: CGA to stand with minA to maintain standing while obtaining orthostatics as patient having R lateral lean. Stood x 3-4 during session    Ambulation/Gait Ambulation/Gait assistance: Contact guard  assist Gait Distance (Feet): 5 Feet (+3') Assistive device: Rolling March Joos (2 wheels) Gait Pattern/deviations: Step-to pattern, Decreased stride length       General Gait Details: able to take steps towards BSC and back with very close CGA. Complaining of dizziness throughout   Stairs             Wheelchair Mobility     Tilt Bed    Modified Rankin (Stroke Patients Only)       Balance Overall balance assessment: Needs assistance Sitting-balance support: Feet supported, No upper extremity supported Sitting balance-[name] Scale: Fair     Standing balance support: Bilateral upper extremity supported, Reliant on assistive device for balance Standing balance-[name] Scale: Poor                              Cognition Arousal: Alert Behavior During Therapy: WFL for tasks assessed/performed Overall Cognitive Status: Within Functional Limits for tasks assessed                                          Exercises      General Comments General comments (skin integrity, edema, etc.): pt still dizzy with standing trials that do not subside until returned to seated position, also with headache today      Pertinent Vitals/Pain Pain Assessment Pain Assessment: Faces Faces Pain Scale: Hurts little more Pain Location: headache Pain Descriptors / Indicators: Headache Pain Intervention(s): Monitored during  session, Repositioned, Limited activity within patient's tolerance    Home Living                          Prior Function            PT Goals (current goals can now be found in the care plan section) Acute Rehab PT Goals Patient Stated Goal: to go home PT Goal Formulation: With patient Time For Goal Achievement: 01/25/23 Potential to Achieve Goals: Good Progress towards PT goals: Progressing toward goals    Frequency    Min 1X/week      PT Plan      Co-evaluation              AM-PAC PT "6 Clicks" Mobility    Outcome Measure  Help needed turning from your back to your side while in a flat bed without using bedrails?: A Little Help needed moving from lying on your back to sitting on the side of a flat bed without using bedrails?: A Little Help needed moving to and from a bed to a chair (including a wheelchair)?: A Little Help needed standing up from a chair using your arms (e.g., wheelchair or bedside chair)?: A Little Help needed to walk in hospital room?: A Lot Help needed climbing 3-5 steps with a railing? : A Lot 6 Click Score: 16    End of Session   Activity Tolerance: Treatment limited secondary to medical complications (Comment) (orthostatics and dizziness) Patient left: in chair;with call bell/phone within reach;with family/visitor present Nurse Communication: Mobility status PT Visit Diagnosis: Unsteadiness on feet (R26.81);Muscle weakness (generalized) (M62.81);History of falling (Z91.81)     Time: 4403-4742 PT Time Calculation (min) (ACUTE ONLY): 39 min  Charges:    $Therapeutic Activity: 23-37 mins PT General Charges $$ ACUTE PT VISIT: 1 Visit                     [name], PT, DPT Physical Therapist - [facility]  [facility]    [name] 01/12/2023, 2:48 PM

## 2023-01-13 DIAGNOSIS — R55 Syncope and collapse: Secondary | ICD-10-CM | POA: Diagnosis not present

## 2023-01-13 LAB — GLUCOSE, CAPILLARY
Glucose-Capillary: 112 mg/dL — ABNORMAL HIGH (ref 70–99)
Glucose-Capillary: 167 mg/dL — ABNORMAL HIGH (ref 70–99)
Glucose-Capillary: 140 mg/dL — ABNORMAL HIGH (ref 70–99)
Glucose-Capillary: 123 mg/dL — ABNORMAL HIGH (ref 70–99)

## 2023-01-13 MED ORDER — KETOROLAC TROMETHAMINE 15 MG/ML IJ SOLN
15.0000 mg | Freq: Once | INTRAMUSCULAR | Status: AC
  Administered 2023-01-14: 15 mg via INTRAVENOUS
  Filled 2023-01-13: qty 1

## 2023-01-13 MED ORDER — POTASSIUM CHLORIDE CRYS ER 20 MEQ PO TBCR
40.0000 meq | EXTENDED_RELEASE_TABLET | Freq: Once | ORAL | Status: AC
  Administered 2023-01-13: 40 meq via ORAL
  Filled 2023-01-13: qty 2

## 2023-01-13 NOTE — Progress Notes (Signed)
Occupational Therapy Treatment Patient Details Name: Candace [name] MRN: [id number] DOB: 29-Feb-1951 Today's Date: 01/13/2023   History of present illness 72 y/o female presented to ED on 01/10/23 for chest pain and fall. Admitted for possible syncope. PMH: HTN, T2DM, anxiety, depression, CAD s/p CABG, hx of CVA, Afib on Eliquis.   OT comments  Pt is supine in bed on arrival. Easily arousable and agreeable to OT session. She continues with headache and chest tightness intermittently. Notified nurse who was to give her meds upon OT exit. Pt performed bed mobility with SUP and extra time. BP noted to drop from supine to sit. Supine: 123/70 to sit 102/65. Pt not symptomatic. STS with Min A and initial posterior lean, then able to stand with HHA x1 with BP 120/65. Pt able to ambulate ~5 feet to sink and sit on BSC with CGA. BP remained around 120/90 to 125/95 for remainder of session. She engaged in bathing at sink with short standing periods up to 1 min 30 sec max d/t weakness, dizziness and chest tightness. HR normal. SUP for UB bathing, MOD A  for LB bathing. Min A for LB dressing of underwear and socks. Ambulated back to bed and left in supine with all needs in place and will cont to require skilled acute OT services to maximize her safety and IND to return to PLOF. Pt agreeable to SNF d/t continued issues with dizziness and weakness with mobility/standing trials. She is highly motivated to improve.       If plan is discharge home, recommend the following:  A little help with walking and/or transfers;A little help with bathing/dressing/bathroom;Assistance with cooking/housework;Help with stairs or ramp for entrance   Equipment Recommendations  BSC/3in1    Recommendations for Other Services      Precautions / Restrictions Restrictions Weight Bearing Restrictions: No       Mobility Bed Mobility Overal bed mobility: Needs Assistance Bed Mobility: Supine to Sit, Sit to Supine     Supine  to sit: Supervision, Used rails, HOB elevated Sit to supine: Supervision, HOB elevated, Used rails   General bed mobility comments: extra time but no physical assist    Transfers Overall transfer level: Needs assistance Equipment used: Rolling walker (2 wheels) Transfers: Sit to/from Stand Sit to Stand: Min assist (posterior lean initally)           General transfer comment: ambulated to sink and sat on BSC to urinate then stood at sink for short periods of bathing and sat for others     Balance Overall balance assessment: Needs assistance Sitting-balance support: Feet supported, No upper extremity supported Sitting balance-[name] Scale: Good     Standing balance support: Bilateral upper extremity supported, Reliant on assistive device for balance Standing balance-[name] Scale: Poor Standing balance comment: needs UE support for balance at all times d/t spinning/dizziness                           ADL either performed or assessed with clinical judgement   ADL Overall ADL's : Needs assistance/impaired     Grooming: Wash/dry face;Standing;Contact guard assist Grooming Details (indicate cue type and reason): CGA for balance at sink Upper Body Bathing: Supervision/ safety Upper Body Bathing Details (indicate cue type and reason): began in standing, fatigued after 1 and a half minutes and returned to seated on BSC to finish UB bathing with SUP Lower Body Bathing: Moderate assistance;Sit to/from stand;Sitting/lateral leans Lower Body Bathing Details (indicate cue  type and reason): in standing pt bathed peri-area/buttocks with CGA; returned to sitting and needed Mod A for below knees Upper Body Dressing : Minimal assistance Upper Body Dressing Details (indicate cue type and reason): to manage over tele monitor Lower Body Dressing: Contact guard assist Lower Body Dressing Details (indicate cue type and reason): for balance in standing to pull fully over buttocks Toilet  Transfer: Minimal assistance;BSC/3in1;Contact guard assist Toilet Transfer Details (indicate cue type and reason): Min/CGA for taking a few steps from bed to sink to sink on [facility] Toileting- Clothing Manipulation and Hygiene: Contact guard assist Toileting - Clothing Manipulation Details (indicate cue type and reason): anterior hygiene performed in standing on her own with CGA/MIN A for balance     Functional mobility during ADLs: Minimal assistance;Contact guard assist      Extremity/Trunk Assessment Upper Extremity Assessment Upper Extremity Assessment: Generalized weakness   Lower Extremity Assessment Lower Extremity Assessment: Generalized weakness        Vision       Perception     Praxis      Cognition Arousal: Alert Behavior During Therapy: WFL for tasks assessed/performed Overall Cognitive Status: Within Functional Limits for tasks assessed                                          Exercises      Shoulder Instructions       General Comments still dizzy with floor feeling as if it is moving with mobility/transfers    Pertinent Vitals/ Pain       Pain Assessment Pain Assessment: Faces Faces Pain Scale: Hurts little more Pain Location: headache and chest tightness Pain Descriptors / Indicators: Headache, Tightness Pain Intervention(s): Monitored during session (notified RN)  Home Living                                          Prior Functioning/Environment              Frequency  Min 1X/week        Progress Toward Goals  OT Goals(current goals can now be found in the care plan section)  Progress towards OT goals: Progressing toward goals  Acute Rehab OT Goals Patient Stated Goal: reduce dizziness OT Goal Formulation: With patient Time For Goal Achievement: 01/25/23 Potential to Achieve Goals: Good  Plan      Co-evaluation                 AM-PAC OT "6 Clicks" Daily Activity     Outcome  Measure   Help from another person eating meals?: None Help from another person taking care of personal grooming?: None Help from another person toileting, which includes using toliet, bedpan, or urinal?: A Little Help from another person bathing (including washing, rinsing, drying)?: A Lot Help from another person to put on and taking off regular upper body clothing?: A Little Help from another person to put on and taking off regular lower body clothing?: A Little 6 Click Score: 19    End of Session Equipment Utilized During Treatment: Rolling walker (2 wheels)  OT Visit Diagnosis: Unsteadiness on feet (R26.81);Other abnormalities of gait and mobility (R26.89);History of falling (Z91.81);Muscle weakness (generalized) (M62.81)   Activity Tolerance Patient tolerated treatment well (still limited by dizziness requiring increased  time and minimal standing time)   Patient Left with call bell/phone within reach;in bed;with bed alarm set   Nurse Communication Mobility status        Time: 1410-1503 OT Time Calculation (min): 53 min  Charges: OT General Charges $OT Visit: 1 Visit OT Treatments $Self Care/Home Management : 53-67 mins  Fredy Gladu, OTR/L  01/13/23, 3:29 PM  Malloree Raboin E Airelle Everding 01/13/2023, 3:24 PM

## 2023-01-13 NOTE — TOC Progression Note (Addendum)
Transition of Care [facility]) - Progression Note    Patient Details  Name: Candace [name] MRN: [id number] Date of Birth: [birth year]  [facility] CM/SW Contact  Truddie Hidden, RN Phone Number: 01/13/2023, 2:30 PM  Clinical Narrative:    Spoke with patient and her daughter, Maralyn Sago at the bedside regarding discharge plan. Patient is agreeable to SNF and would like [facility]  as her first choice. She would like to consider [facility] and [facility] as her second and third choice. Patient and her daughter were advised facility has the right to make bed offers. They have been advised insurance authorization are required for SNF. RNCM explained most plans cover the first 20 days before copayment is expected. RNCM  also discussed HH as an option when patient is ready to discharge from facility. They have been provided additional information from [facility] [place] regarding HH.   Bed search started.             Expected Discharge Plan and Services         Expected Discharge Date: 01/11/23                                     Social Determinants of Health (SDOH) Interventions SDOH Screenings   Food Insecurity: No Food Insecurity (01/12/2023)  Housing: Low Risk  (01/12/2023)  Transportation Needs: No Transportation Needs (01/12/2023)  Utilities: Not At Risk (01/12/2023)  Alcohol Screen: Low Risk  (08/10/2021)  Depression (PHQ2-9): Low Risk  (03/26/2022)  Financial Resource Strain: Low Risk  (03/26/2022)  Physical Activity: Insufficiently Active (08/10/2021)  Social Connections: Moderately Integrated (03/26/2022)  Stress: No Stress Concern Present (03/26/2022)  Tobacco Use: Medium Risk (01/10/2023)    Readmission Risk Interventions     No data to display

## 2023-01-13 NOTE — Progress Notes (Signed)
PROGRESS NOTE    Candace [name]   WUJ:[id number] DOB: 03-31-1950  DOA: 01/10/2023 Date of Service: 01/13/23 which is hospital day 2  PCP: Bosie Clos, MD    HPI: [name] is a 72 year old female with history of hyperlipidemia, insulin-dependent diabetes mellitus, hypertension, GERD, depression, anxiety, on anticoagulation, CAD status post CABG LIMA to LAD, SVG to PDA and OM, DES to SVG in 2021, and balloon angioplasty at outside hospital in 2023, history of CVA, paroxysmal atrial fibrillation, on Eliquis, who presents emergency department for chief concerns of chest pain and increased falling.   Hospital course / significant events:  10/14: admitted to hospitalist service, syncope d/t orthostatic hypotension, question arrhythmia. Troponin trended negative, CP resolved. MRI brain no concerns. Imaging T/L-spine and sacrum/coccyx no concerns.  10/15: PT/OT eval, recs for HH. Cardiology recs for 14-day cardiac monitoring, meds as below  10/16: significant orthostatic drop BP standing w/ dizziness symptoms. PT/OT update recs for SNF. Lowered dose isosorbide  10/17: pt feeling better today, awaiting SNF placement   Consultants:  Cardiology   Procedures/Surgeries: none      ASSESSMENT & PLAN:   Syncope secondary to orthostatic hypotension, multiple risk factors  W/u possible arrhythmia Zio patch have been placed for monitoring and for outpatient follow-up PT OT work with patient and patient's blood pressure was positive for orthostatic drop Reduced isosorbide yesterday given orthostatic hypotension    Coronary artery disease S/P CABG x 4 Chest pain-resolved High sensitive troponin were negative x 2. Echo done on 07/22/2022: Showed EF estimated ejection fraction 65 to 70%.  Grade 1 diastolic dysfunction. Continue cardiac monitoring Home Ranexa 1000 mg p.o. twice daily and propranolol 60 mg daily resumed Of note, propranolol Rx d/t tremor but it doesn't seem to have  helped her tremor, suspect parkinsonism  Continue atorvastatin 80 mg daily, ezetimibe 10 mg daily, aspirin 81 mg daily, Eliquis 5 mg p.o. twice daily Reduced isosorbide yesterday given orthostatic hypotension    Type 2 diabetes mellitus with diabetic polyneuropathy, with long-term current use of insulin (HCC) Patient takes Jardiance 10 mg daily, Trulicity 1.5 mg subcutaneous injection weekly, Lantus 100 units subcutaneous nightly Continue insulin SSI with at bedtime coverage ordered Goal inpatient blood glucose levels 140-180   Paroxysmal atrial fibrillation (HCC) Continue Eliquis  Back pain Query secondary to fall CT scan of the lumbar and thoracic spine all within normal limit with no fractures Continue as needed pain medication    Mild obstructive sleep apnea Continue CPAP nightly    Parkinson syndrome Can also contribute to unsteady gait / fall risk Continue outpatient follow-up    Hyperlipidemia Continue atorvastatin 80 mg daily   Gastroesophageal reflux disease Protonix 20 mg p.o. twice daily resumed    obesity based on BMI: Body mass index is 30.18 kg/m.   DVT prophylaxis: eliquis IV fluids: no continuous IV fluids  Nutrition: cardiac diet  Central lines / invasive devices: none  Code Status: FULL CODE ACP documentation reviewed: DNR order is on file in [facility] was voided after confirming FULL CODE w/ patient   TOC needs: SNF placement  Barriers to dispo / significant pending items: placement, monitro BP on lower isosorbide              Subjective / Brief ROS:  Patient reports feeling okay today Denies CP/SOB.  Pain controlled.  Denies new weakness.  Tolerating diet.  Reports no concerns w/ urination/defecation.   Family Communication: none at this time  Objective Findings:  Vitals:   01/12/23 2318 01/13/23 0330 01/13/23 0743 01/13/23 1149  BP: 126/61 (!) 159/81 (!) 159/78 (!) 148/73  Pulse: 78 75 74 75  Resp: 16 16    Temp: (!) 97.5  F (36.4 C) 98.5 F (36.9 C) 98.2 F (36.8 C) 98.8 F (37.1 C)  TempSrc: Oral Oral    SpO2: 98% 99% 97% 98%  Weight:      Height:        Intake/Output Summary (Last 24 hours) at 01/13/2023 1414 Last data filed at 01/12/2023 1943 Gross per 24 hour  Intake 240 ml  Output --  Net 240 ml   Filed Weights   01/10/23 0910  Weight: 74.8 kg    Examination:  Physical Exam Constitutional:      General: She is not in acute distress. Cardiovascular:     Rate and Rhythm: Normal rate and regular rhythm.  Pulmonary:     Effort: Pulmonary effort is normal.     Breath sounds: Normal breath sounds.  Abdominal:     Palpations: Abdomen is soft.  Musculoskeletal:     Right lower leg: No edema.     Left lower leg: No edema.  Neurological:     Mental Status: She is alert.     Motor: Tremor (resting tremor occasionally pill-rolling, head tremor at rest) present.     Gait: Gait abnormal.     Comments: Speech is slow, fairly flat facies            Scheduled Medications:    stroke: early stages of recovery book   Does not apply Once   apixaban  5 mg Oral BID   aspirin EC  81 mg Oral Daily   atorvastatin  80 mg Oral Daily   ezetimibe  10 mg Oral QHS   insulin aspart  0-15 Units Subcutaneous TID WC   insulin aspart  0-5 Units Subcutaneous QHS   insulin glargine-yfgn  12 Units Subcutaneous QHS   isosorbide mononitrate  30 mg Oral Daily   pantoprazole  20 mg Oral BID   propranolol ER  60 mg Oral Daily   ranolazine  1,000 mg Oral BID   sertraline  100 mg Oral QHS    Continuous Infusions:   PRN Medications:  acetaminophen **OR** acetaminophen (TYLENOL) oral liquid 160 mg/5 mL **OR** acetaminophen, albuterol, ALPRAZolam, lidocaine, nitroGLYCERIN, ondansetron (ZOFRAN) IV, senna-docusate  Antimicrobials from admission:  Anti-infectives (From admission, onward)    None           Data Reviewed:  I have personally reviewed the following...  CBC: Recent Labs  Lab  01/10/23 0933 01/11/23 0417 01/12/23 0243  WBC 8.3 6.5 5.3  NEUTROABS  --   --  1.9  HGB 12.5 11.4* 11.3*  HCT 38.6 35.1* 34.6*  MCV 102.7* 102.3* 101.5*  PLT 182 172 167   Basic Metabolic Panel: Recent Labs  Lab 01/10/23 0933 01/11/23 0417 01/12/23 0243  NA 143 139 141  K 4.2 3.5 3.3*  CL 108 106 109  CO2 27 25 25   GLUCOSE 175* 94 122*  BUN 17 15 12   CREATININE 0.86 0.95 0.83  CALCIUM 8.7* 8.4* 8.4*   GFR: Estimated Creatinine Clearance: 58.9 mL/min (by C-G formula based on SCr of 0.83 mg/dL). Liver Function Tests: Recent Labs  Lab 01/10/23 0933  AST 17  ALT 15  ALKPHOS 130*  BILITOT 0.6  PROT 7.1  ALBUMIN 3.9   No results for input(s): "LIPASE", "AMYLASE" in the last 168  hours. No results for input(s): "AMMONIA" in the last 168 hours. Coagulation Profile: No results for input(s): "INR", "PROTIME" in the last 168 hours. Cardiac Enzymes: Recent Labs  Lab 01/10/23 0933  CKTOTAL 55   BNP (last 3 results) No results for input(s): "PROBNP" in the last 8760 hours. HbA1C: No results for input(s): "HGBA1C" in the last 72 hours. CBG: Recent Labs  Lab 01/12/23 1114 01/12/23 1511 01/12/23 2056 01/13/23 0744 01/13/23 1151  GLUCAP 252* 157* 141* 112* 167*   Lipid Profile: Recent Labs    01/11/23 0417  CHOL 160  HDL 59  LDLCALC 83  TRIG 91  CHOLHDL 2.7   Thyroid Function Tests: No results for input(s): "TSH", "T4TOTAL", "FREET4", "T3FREE", "THYROIDAB" in the last 72 hours. Anemia Panel: No results for input(s): "VITAMINB12", "FOLATE", "FERRITIN", "TIBC", "IRON", "RETICCTPCT" in the last 72 hours. Most Recent Urinalysis On File:     Component Value Date/Time   COLORURINE YELLOW (A) 01/10/2023 1620   APPEARANCEUR CLEAR (A) 01/10/2023 1620   APPEARANCEUR Clear 12/24/2014 0905   LABSPEC 1.027 01/10/2023 1620   LABSPEC 1.030 09/14/2013 2123   PHURINE 7.0 01/10/2023 1620   GLUCOSEU >=500 (A) 01/10/2023 1620   GLUCOSEU Negative 09/14/2013 2123    HGBUR NEGATIVE 01/10/2023 1620   BILIRUBINUR NEGATIVE 01/10/2023 1620   BILIRUBINUR Negative 02/06/2020 1014   BILIRUBINUR Negative 12/24/2014 0905   BILIRUBINUR Negative 09/14/2013 2123   KETONESUR NEGATIVE 01/10/2023 1620   PROTEINUR NEGATIVE 01/10/2023 1620   UROBILINOGEN 0.2 02/06/2020 1014   NITRITE NEGATIVE 01/10/2023 1620   LEUKOCYTESUR NEGATIVE 01/10/2023 1620   LEUKOCYTESUR 3+ 09/14/2013 2123   Sepsis Labs: @LABRCNTIP (procalcitonin:4,lacticidven:4) Microbiology: Recent Results (from the past 240 hour(s))  SARS Coronavirus 2 by RT PCR (hospital order, performed in [facility] hospital lab) *cepheid single result test* Anterior Nasal Swab     Status: None   Collection Time: 01/10/23 10:57 AM   Specimen: Anterior Nasal Swab  Result Value Ref Range Status   SARS Coronavirus 2 by RT PCR NEGATIVE NEGATIVE Final    Comment: (NOTE) SARS-CoV-2 target nucleic acids are NOT DETECTED.  The SARS-CoV-2 RNA is generally detectable in upper and lower respiratory specimens during the acute phase of infection. The lowest concentration of SARS-CoV-2 viral copies this assay can detect is 250 copies / mL. A negative result does not preclude SARS-CoV-2 infection and should not be used as the sole basis for treatment or other patient management decisions.  A negative result may occur with improper specimen collection / handling, submission of specimen other than nasopharyngeal swab, presence of viral mutation(s) within the areas targeted by this assay, and inadequate number of viral copies (<250 copies / mL). A negative result must be combined with clinical observations, patient history, and epidemiological information.  Fact Sheet for Patients:   RoadLapTop.co.za  Fact Sheet for Healthcare Providers: http://kim-miller.com/  This test is not yet approved or  cleared by the [place] FDA and has been authorized for detection and/or diagnosis  of SARS-CoV-2 by FDA under an Emergency Use Authorization (EUA).  This EUA will remain in effect (meaning this test can be used) for the duration of the COVID-19 declaration under Section 564(b)(1) of the Act, 21 U.S.C. section 360bbb-3(b)(1), unless the authorization is terminated or revoked sooner.  Performed at [facility], [address] [place], [place] [address]       Radiology Studies last 3 days: CT LUMBAR SPINE WO CONTRAST  Result Date: 01/10/2023 CLINICAL DATA:  Low back pain. Tenderness over  T9-T11 and L2-L3 area. EXAM: CT THORACIC AND LUMBAR SPINE WITHOUT CONTRAST TECHNIQUE: Multidetector CT imaging of the thoracic and lumbar spine was performed without contrast. Multiplanar CT image reconstructions were also generated. RADIATION DOSE REDUCTION: This exam was performed according to the departmental dose-optimization program which includes automated exposure control, adjustment of the mA and/or kV according to patient size and/or use of iterative reconstruction technique. COMPARISON:  CT abdomen pelvis dated 07/01/2022 and chest CT dated 08/11/2021. FINDINGS: Evaluation for fracture is limited due to osteopenia. CT THORACIC SPINE FINDINGS Alignment: No acute subluxation. Vertebrae: No acute fracture.  Osteopenia. Paraspinal and other soft tissues: No perispinal fluid collection. There is coronary vascular calcification. Atherosclerotic calcification of the aorta. Disc levels: No acute findings.  Degenerative changes. CT LUMBAR SPINE FINDINGS Segmentation: 5 lumbar type vertebrae. Alignment: No acute subluxation. Vertebrae: No acute fracture. The bones are osteopenic which limits evaluation for fracture. Paraspinal and other soft tissues: No paraspinal fluid collection. Atherosclerotic calcification of the aorta. Disc levels: No acute findings. Multilevel degenerative changes with disc desiccation and vacuum phenomena. IMPRESSION: 1. No acute/traumatic thoracic or lumbar spine  pathology. 2. Osteopenia. 3.  Aortic Atherosclerosis (ICD10-I70.0). Electronically Signed   By: Elgie Collard M.D.   On: 01/10/2023 23:22   CT THORACIC SPINE WO CONTRAST  Result Date: 01/10/2023 CLINICAL DATA:  Low back pain. Tenderness over T9-T11 and L2-L3 area. EXAM: CT THORACIC AND LUMBAR SPINE WITHOUT CONTRAST TECHNIQUE: Multidetector CT imaging of the thoracic and lumbar spine was performed without contrast. Multiplanar CT image reconstructions were also generated. RADIATION DOSE REDUCTION: This exam was performed according to the departmental dose-optimization program which includes automated exposure control, adjustment of the mA and/or kV according to patient size and/or use of iterative reconstruction technique. COMPARISON:  CT abdomen pelvis dated 07/01/2022 and chest CT dated 08/11/2021. FINDINGS: Evaluation for fracture is limited due to osteopenia. CT THORACIC SPINE FINDINGS Alignment: No acute subluxation. Vertebrae: No acute fracture.  Osteopenia. Paraspinal and other soft tissues: No perispinal fluid collection. There is coronary vascular calcification. Atherosclerotic calcification of the aorta. Disc levels: No acute findings.  Degenerative changes. CT LUMBAR SPINE FINDINGS Segmentation: 5 lumbar type vertebrae. Alignment: No acute subluxation. Vertebrae: No acute fracture. The bones are osteopenic which limits evaluation for fracture. Paraspinal and other soft tissues: No paraspinal fluid collection. Atherosclerotic calcification of the aorta. Disc levels: No acute findings. Multilevel degenerative changes with disc desiccation and vacuum phenomena. IMPRESSION: 1. No acute/traumatic thoracic or lumbar spine pathology. 2. Osteopenia. 3.  Aortic Atherosclerosis (ICD10-I70.0). Electronically Signed   By: Elgie Collard M.D.   On: 01/10/2023 23:22   DG Sacrum/Coccyx  Result Date: 01/10/2023 CLINICAL DATA:  Sacral pain after fall EXAM: SACRUM AND COCCYX - 2+ VIEW COMPARISON:  None  Available. FINDINGS: There is no evidence of fracture or other focal bone lesions. IMPRESSION: Negative. Electronically Signed   By: Jasmine Pang M.D.   On: 01/10/2023 22:47   DG Chest Port 1 View  Result Date: 01/10/2023 CLINICAL DATA:  Weakness chest pain EXAM: PORTABLE CHEST 1 VIEW COMPARISON:  06/30/2022 FINDINGS: Post sternotomy changes. No acute airspace disease or effusion. Stable cardiomediastinal silhouette. No pneumothorax IMPRESSION: No active disease. Electronically Signed   By: Jasmine Pang M.D.   On: 01/10/2023 22:46   MR BRAIN WO CONTRAST  Result Date: 01/10/2023 CLINICAL DATA:  Stroke suspected, dizziness and blurred vision, now resolved, profound weakness with fall EXAM: MRI HEAD WITHOUT CONTRAST TECHNIQUE: Multiplanar, multiecho pulse sequences of the brain and surrounding structures  were obtained without intravenous contrast. COMPARISON:  02/11/2022 MRI head, correlation is also made with 01/10/2023 CT head FINDINGS: Brain: No restricted diffusion to suggest acute or subacute infarct. No acute hemorrhage, mass, mass effect, or midline shift. No hydrocephalus or extra-axial collection. Normal pituitary and craniocervical junction. No hemosiderin deposition to suggest remote hemorrhage. Multiple remote lacunar infarcts in the basal ganglia and right cerebellar lobe. T2 hyperintense signal in the periventricular white matter, likely the sequela of chronic small vessel ischemic disease. Vascular: Normal arterial flow voids. Skull and upper cervical spine: Normal marrow signal. Sinuses/Orbits: Mild mucosal thickening in the ethmoid air cells and inferior left frontal sinus. No acute finding in the orbits. Other: The mastoid air cells are well aerated. IMPRESSION: No acute intracranial process. No evidence of acute or subacute infarct. Electronically Signed   By: Wiliam Ke M.D.   On: 01/10/2023 22:23   CT Head Wo Contrast  Result Date: 01/10/2023 CLINICAL DATA:  72 year old female  with chest pain, headache, fall, found down. EXAM: CT HEAD WITHOUT CONTRAST TECHNIQUE: Contiguous axial images were obtained from the base of the skull through the vertex without intravenous contrast. RADIATION DOSE REDUCTION: This exam was performed according to the departmental dose-optimization program which includes automated exposure control, adjustment of the mA and/or kV according to patient size and/or use of iterative reconstruction technique. COMPARISON:  Brain MRI 02/11/2022.  Head CT 01/01/2022. FINDINGS: Brain: Cerebral volume is within normal limits for age. No midline shift, ventriculomegaly, mass effect, evidence of mass lesion, intracranial hemorrhage or evidence of cortically based acute infarction. Chronic bilateral basal ganglia lacunar infarcts. Chronic encephalomalacia in the body of the corpus callosum. Stable gray-white matter differentiation throughout the brain. Vascular: Calcified atherosclerosis at the skull base. No suspicious intracranial vascular hyperdensity. Skull: Stable, intact. Sinuses/Orbits: Visualized paranasal sinuses and mastoids are stable and well aerated. Other: No orbit or scalp soft tissue injury identified. IMPRESSION: 1. No acute intracranial abnormality or acute traumatic injury identified. 2. Stable non contrast CT appearance of advanced chronic small vessel disease. Electronically Signed   By: Odessa Fleming M.D.   On: 01/10/2023 11:37       Time spent: 50 min    Sunnie Nielsen, DO [facility] 01/13/2023, 2:14 PM    Dictation software may have been used to generate the above note. Typos may occur and escape review in typed/dictated notes. Please contact Dr Lyn Hollingshead directly for clarity if needed.  Staff may message me via secure chat in Epic  but this may not receive an immediate response,  please page me for urgent matters!  If 7PM-7AM, please contact night coverage [email or web address]

## 2023-01-13 NOTE — Progress Notes (Signed)
Physical Therapy Treatment Patient Details Name: Candace [name] MRN: [id number] DOB: 1951/03/08 Today's Date: 01/13/2023   History of Present Illness 72 y/o female presented to ED on 01/10/23 for chest pain and fall. Admitted for possible syncope. PMH: HTN, T2DM, anxiety, depression, CAD s/p CABG, hx of CVA, Afib on Eliquis.    PT Comments  Pt received in Semi-Fowler's position and agreeable to therapy.  Pt wanting to get up and attempt to mobilize a little if possible.  Pt required consistent encouragement throughout the session, however she performed well with all tasks.  Pt did not to need some HHA to come upright into sitting, and no assistance other than verbal cuing for coming upright into standing.  Pt did note that she was experiencing fatigue in the LE's when walking and her legs feeling like lead, but otherwise performed well.  Pt elected to go a few steps outside of the room and return to the bed as she felt her legs were about to buckle.  Pt somewhat concerned about progress, but was advised that she is making progress albeit slow, it is still progress.  Pt left in room with all needs met and call bell within reach.      If plan is discharge home, recommend the following: A little help with walking and/or transfers;A little help with bathing/dressing/bathroom;Assistance with cooking/housework;Assist for transportation;Help with stairs or ramp for entrance   Can travel by private vehicle     No  Equipment Recommendations  Rolling walker (2 wheels)    Recommendations for Other Services       Precautions / Restrictions Precautions Precautions: Fall Precaution Comments: watch BP Restrictions Weight Bearing Restrictions: No     Mobility  Bed Mobility Overal bed mobility: Needs Assistance Bed Mobility: Supine to Sit, Sit to Supine     Supine to sit: Supervision, Used rails, HOB elevated Sit to supine: Supervision, HOB elevated, Used rails   General bed mobility comments:  extra time but no physical assist    Transfers Overall transfer level: Needs assistance Equipment used: Rolling walker (2 wheels) Transfers: Sit to/from Stand Sit to Stand: Min assist (posterior lean initally)           General transfer comment: ambulated out of room into hallway and returned due to legs feeling weak.    Ambulation/Gait Ambulation/Gait assistance: Contact guard assist Gait Distance (Feet): 10 Feet Assistive device: Rolling walker (2 wheels) Gait Pattern/deviations: Step-to pattern, Decreased stride length Gait velocity: decreased     General Gait Details: able to ambulate out of the room and back to the bed, however noted her LE's to be fatiguing and giving out or feeling "like lead".   Stairs             Wheelchair Mobility     Tilt Bed    Modified Rankin (Stroke Patients Only)       Balance Overall balance assessment: Needs assistance Sitting-balance support: Feet supported, No upper extremity supported Sitting balance-[name] Scale: Good     Standing balance support: Bilateral upper extremity supported, Reliant on assistive device for balance Standing balance-[name] Scale: Poor Standing balance comment: needs UE support for balance at all times d/t LE weakness                            Cognition Arousal: Alert Behavior During Therapy: WFL for tasks assessed/performed Overall Cognitive Status: Within Functional Limits for tasks assessed  Exercises      General Comments General comments (skin integrity, edema, etc.): still dizzy with floor feeling as if it is moving with mobility/transfers      Pertinent Vitals/Pain Pain Assessment Pain Assessment: No/denies pain    Home Living                          Prior Function            PT Goals (current goals can now be found in the care plan section) Acute Rehab PT Goals Patient Stated Goal: to go  home PT Goal Formulation: With patient Time For Goal Achievement: 01/25/23 Potential to Achieve Goals: Good Progress towards PT goals: Progressing toward goals    Frequency    Min 1X/week      PT Plan      Co-evaluation              AM-PAC PT "6 Clicks" Mobility   Outcome Measure  Help needed turning from your back to your side while in a flat bed without using bedrails?: A Little Help needed moving from lying on your back to sitting on the side of a flat bed without using bedrails?: A Little Help needed moving to and from a bed to a chair (including a wheelchair)?: A Little Help needed standing up from a chair using your arms (e.g., wheelchair or bedside chair)?: A Little Help needed to walk in hospital room?: A Lot Help needed climbing 3-5 steps with a railing? : A Lot 6 Click Score: 16    End of Session   Activity Tolerance: Patient limited by fatigue Patient left: in bed;with call bell/phone within reach;with bed alarm set;with nursing/sitter in room;with family/visitor present Nurse Communication: Mobility status PT Visit Diagnosis: Unsteadiness on feet (R26.81);Muscle weakness (generalized) (M62.81);History of falling (Z91.81)     Time: 2952-8413 PT Time Calculation (min) (ACUTE ONLY): 19 min  Charges:    $Therapeutic Activity: 8-22 mins PT General Charges $$ ACUTE PT VISIT: 1 Visit                     Nolon Bussing, PT, DPT Physical Therapist - [facility]  01/13/23, 4:20 PM

## 2023-01-14 DIAGNOSIS — R55 Syncope and collapse: Secondary | ICD-10-CM | POA: Diagnosis not present

## 2023-01-14 LAB — GLUCOSE, CAPILLARY
Glucose-Capillary: 121 mg/dL — ABNORMAL HIGH (ref 70–99)
Glucose-Capillary: 208 mg/dL — ABNORMAL HIGH (ref 70–99)
Glucose-Capillary: 223 mg/dL — ABNORMAL HIGH (ref 70–99)
Glucose-Capillary: 201 mg/dL — ABNORMAL HIGH (ref 70–99)

## 2023-01-14 MED ORDER — ISOSORBIDE MONONITRATE ER 60 MG PO TB24
60.0000 mg | ORAL_TABLET | Freq: Once | ORAL | Status: AC
  Administered 2023-01-14: 60 mg via ORAL
  Filled 2023-01-14: qty 1

## 2023-01-14 MED ORDER — TRULICITY 1.5 MG/0.5ML ~~LOC~~ SOAJ: 1.5000 mg | SUBCUTANEOUS | Status: AC

## 2023-01-14 MED ORDER — ISOSORBIDE MONONITRATE ER 60 MG PO TB24
120.0000 mg | ORAL_TABLET | Freq: Every day | ORAL | Status: DC
  Administered 2023-01-15 – 2023-01-17 (×3): 120 mg via ORAL
  Filled 2023-01-14 (×3): qty 2

## 2023-01-14 MED ORDER — ISOSORBIDE MONONITRATE ER 30 MG PO TB24: 30.0000 mg | ORAL_TABLET | Freq: Every day | ORAL | Status: DC

## 2023-01-14 MED ORDER — ISOSORBIDE MONONITRATE ER 120 MG PO TB24: 120.0000 mg | ORAL_TABLET | Freq: Every day | ORAL | 0 refills | Status: AC

## 2023-01-14 MED ORDER — SENNOSIDES-DOCUSATE SODIUM 8.6-50 MG PO TABS
1.0000 | ORAL_TABLET | Freq: Once | ORAL | Status: AC
  Administered 2023-01-14: 1 via ORAL
  Filled 2023-01-14: qty 1

## 2023-01-14 NOTE — Plan of Care (Signed)
Problem: Education: Goal: Understanding of cardiac disease, CV risk reduction, and recovery process will improve Outcome: Progressing Goal: Individualized Educational Video(s) Outcome: Progressing   Problem: Activity: Goal: Ability to tolerate increased activity will improve Outcome: Progressing   Problem: Cardiac: Goal: Ability to achieve and maintain adequate cardiovascular perfusion will improve Outcome: Progressing   Problem: Health Behavior/Discharge Planning: Goal: Ability to safely manage health-related needs after discharge will improve Outcome: Progressing   Problem: Education: Goal: Knowledge of disease or condition will improve Outcome: Progressing Goal: Knowledge of secondary prevention will improve (MUST DOCUMENT ALL) Outcome: Progressing Goal: Knowledge of patient specific risk factors will improve Loraine Leriche N/A or DELETE if not current risk factor) Outcome: Progressing   Problem: Ischemic Stroke/TIA Tissue Perfusion: Goal: Complications of ischemic stroke/TIA will be minimized Outcome: Progressing   Problem: Coping: Goal: Will verbalize positive feelings about self Outcome: Progressing Goal: Will identify appropriate support needs Outcome: Progressing   Problem: Health Behavior/Discharge Planning: Goal: Ability to manage health-related needs will improve Outcome: Progressing Goal: Goals will be collaboratively established with patient/family Outcome: Progressing   Problem: Self-Care: Goal: Ability to participate in self-care as condition permits will improve Outcome: Progressing Goal: Verbalization of feelings and concerns over difficulty with self-care will improve Outcome: Progressing Goal: Ability to communicate needs accurately will improve Outcome: Progressing   Problem: Nutrition: Goal: Risk of aspiration will decrease Outcome: Progressing Goal: Dietary intake will improve Outcome: Progressing   Problem: Education: Goal: Ability to describe  self-care measures that may prevent or decrease complications (Diabetes Survival Skills Education) will improve Outcome: Progressing Goal: Individualized Educational Video(s) Outcome: Progressing   Problem: Coping: Goal: Ability to adjust to condition or change in health will improve Outcome: Progressing   Problem: Fluid Volume: Goal: Ability to maintain a balanced intake and output will improve Outcome: Progressing   Problem: Health Behavior/Discharge Planning: Goal: Ability to identify and utilize available resources and services will improve Outcome: Progressing Goal: Ability to manage health-related needs will improve Outcome: Progressing   Problem: Metabolic: Goal: Ability to maintain appropriate glucose levels will improve Outcome: Progressing   Problem: Nutritional: Goal: Maintenance of adequate nutrition will improve Outcome: Progressing Goal: Progress toward achieving an optimal weight will improve Outcome: Progressing   Problem: Skin Integrity: Goal: Risk for impaired skin integrity will decrease Outcome: Progressing   Problem: Tissue Perfusion: Goal: Adequacy of tissue perfusion will improve Outcome: Progressing   Problem: Education: Goal: Knowledge of General Education information will improve Description: Including pain rating scale, medication(s)/side effects and non-pharmacologic comfort measures Outcome: Progressing   Problem: Health Behavior/Discharge Planning: Goal: Ability to manage health-related needs will improve Outcome: Progressing   Problem: Clinical Measurements: Goal: Ability to maintain clinical measurements within normal limits will improve Outcome: Progressing Goal: Will remain free from infection Outcome: Progressing Goal: Diagnostic test results will improve Outcome: Progressing Goal: Respiratory complications will improve Outcome: Progressing Goal: Cardiovascular complication will be avoided Outcome: Progressing   Problem:  Activity: Goal: Risk for activity intolerance will decrease Outcome: Progressing   Problem: Nutrition: Goal: Adequate nutrition will be maintained Outcome: Progressing   Problem: Coping: Goal: Level of anxiety will decrease Outcome: Progressing   Problem: Elimination: Goal: Will not experience complications related to bowel motility Outcome: Progressing Goal: Will not experience complications related to urinary retention Outcome: Progressing   Problem: Pain Managment: Goal: General experience of comfort will improve Outcome: Progressing   Problem: Safety: Goal: Ability to remain free from injury will improve Outcome: Progressing   Problem: Skin Integrity: Goal: Risk for impaired  skin integrity will decrease Outcome: Progressing   Problem: Education: Goal: Understanding of post-operative needs will improve Outcome: Progressing Goal: Individualized Educational Video(s) Outcome: Progressing

## 2023-01-14 NOTE — TOC Progression Note (Addendum)
Transition of Care [facility]) - Progression Note    Patient Details  Name: Candace [name] MRN: [id number] Date of Birth: 1950/11/19  Transition of Care [facility]) CM/SW Contact  Truddie Hidden, RN Phone Number: 01/14/2023, 9:35 AM  Clinical Narrative:   Spoke with patient to give bed offers for [facility] and [facility]. She is agreeable to [facility]. Patient request to speak with her daughter, Maralyn Sago. RNCM advised insurance auth would need to be obtained. Maralyn Sago is requesting if patient can't be approved and moved today to wait until Monday.   10:14pm Vesta Mixer started and approved. Spoke with [place] at [facility]. Patient can be accepted today. MD notified.   10:23pm Spoke with patient to advised she was approved for [facility] and wold likely be discharged today.            Expected Discharge Plan and Services         Expected Discharge Date: 01/11/23                                     Social Determinants of Health (SDOH) Interventions SDOH Screenings   Food Insecurity: No Food Insecurity (01/12/2023)  Housing: Low Risk  (01/12/2023)  Transportation Needs: No Transportation Needs (01/12/2023)  Utilities: Not At Risk (01/12/2023)  Alcohol Screen: Low Risk  (08/10/2021)  Depression (PHQ2-9): Low Risk  (03/26/2022)  Financial Resource Strain: Low Risk  (03/26/2022)  Physical Activity: Insufficiently Active (08/10/2021)  Social Connections: Moderately Integrated (03/26/2022)  Stress: No Stress Concern Present (03/26/2022)  Tobacco Use: Medium Risk (01/10/2023)    Readmission Risk Interventions     No data to display

## 2023-01-14 NOTE — Care Management Important Message (Signed)
Important Message  Patient Details  Name: Candace [name] MRN: [id number] Date of Birth: [birth year]   Important Message Given:  N/A - LOS <3 / Initial given by admissions     Olegario Messier A Mackayla Mullins 01/14/2023, 9:34 AM

## 2023-01-14 NOTE — Plan of Care (Signed)
Problem: Education: Goal: Understanding of cardiac disease, CV risk reduction, and recovery process will improve Outcome: Progressing Goal: Individualized Educational Video(s) Outcome: Progressing   Problem: Activity: Goal: Ability to tolerate increased activity will improve Outcome: Progressing   Problem: Cardiac: Goal: Ability to achieve and maintain adequate cardiovascular perfusion will improve Outcome: Progressing   Problem: Health Behavior/Discharge Planning: Goal: Ability to safely manage health-related needs after discharge will improve Outcome: Progressing   Problem: Education: Goal: Knowledge of disease or condition will improve Outcome: Progressing Goal: Knowledge of secondary prevention will improve (MUST DOCUMENT ALL) Outcome: Progressing Goal: Knowledge of patient specific risk factors will improve Loraine Leriche N/A or DELETE if not current risk factor) Outcome: Progressing   Problem: Ischemic Stroke/TIA Tissue Perfusion: Goal: Complications of ischemic stroke/TIA will be minimized Outcome: Progressing   Problem: Coping: Goal: Will verbalize positive feelings about self Outcome: Progressing Goal: Will identify appropriate support needs Outcome: Progressing   Problem: Health Behavior/Discharge Planning: Goal: Ability to manage health-related needs will improve Outcome: Progressing Goal: Goals will be collaboratively established with patient/family Outcome: Progressing   Problem: Self-Care: Goal: Ability to participate in self-care as condition permits will improve Outcome: Progressing Goal: Verbalization of feelings and concerns over difficulty with self-care will improve Outcome: Progressing Goal: Ability to communicate needs accurately will improve Outcome: Progressing   Problem: Nutrition: Goal: Risk of aspiration will decrease Outcome: Progressing Goal: Dietary intake will improve Outcome: Progressing   Problem: Education: Goal: Ability to describe  self-care measures that may prevent or decrease complications (Diabetes Survival Skills Education) will improve Outcome: Progressing Goal: Individualized Educational Video(s) Outcome: Progressing   Problem: Coping: Goal: Ability to adjust to condition or change in health will improve Outcome: Progressing   Problem: Fluid Volume: Goal: Ability to maintain a balanced intake and output will improve Outcome: Progressing   Problem: Health Behavior/Discharge Planning: Goal: Ability to identify and utilize available resources and services will improve Outcome: Progressing Goal: Ability to manage health-related needs will improve Outcome: Progressing   Problem: Metabolic: Goal: Ability to maintain appropriate glucose levels will improve Outcome: Progressing   Problem: Nutritional: Goal: Maintenance of adequate nutrition will improve Outcome: Progressing Goal: Progress toward achieving an optimal weight will improve Outcome: Progressing   Problem: Skin Integrity: Goal: Risk for impaired skin integrity will decrease Outcome: Progressing   Problem: Tissue Perfusion: Goal: Adequacy of tissue perfusion will improve Outcome: Progressing   Problem: Education: Goal: Knowledge of General Education information will improve Description: Including pain rating scale, medication(s)/side effects and non-pharmacologic comfort measures Outcome: Progressing   Problem: Health Behavior/Discharge Planning: Goal: Ability to manage health-related needs will improve Outcome: Progressing   Problem: Clinical Measurements: Goal: Ability to maintain clinical measurements within normal limits will improve Outcome: Progressing Goal: Will remain free from infection Outcome: Progressing Goal: Diagnostic test results will improve Outcome: Progressing Goal: Respiratory complications will improve Outcome: Progressing Goal: Cardiovascular complication will be avoided Outcome: Progressing   Problem:  Activity: Goal: Risk for activity intolerance will decrease Outcome: Progressing   Problem: Nutrition: Goal: Adequate nutrition will be maintained Outcome: Progressing   Problem: Coping: Goal: Level of anxiety will decrease Outcome: Progressing   Problem: Elimination: Goal: Will not experience complications related to bowel motility Outcome: Progressing Goal: Will not experience complications related to urinary retention Outcome: Progressing   Problem: Pain Managment: Goal: General experience of comfort will improve Outcome: Progressing   Problem: Safety: Goal: Ability to remain free from injury will improve Outcome: Progressing   Problem: Skin Integrity: Goal: Risk for impaired  skin integrity will decrease Outcome: Progressing

## 2023-01-14 NOTE — TOC Progression Note (Signed)
Transition of Care [facility]) - Progression Note    Patient Details  Name: Candace [name] MRN: [id number] Date of Birth: 03-16-1951  [facility]) CM/SW Contact  Truddie Hidden, RN Phone Number: 01/14/2023, 2:09 PM  Clinical Narrative:    Spoke with Raoul Pitch at [facility] to update about patient's discharge. Patient can admit Monday, 10/21. MD notified.       Barriers to Discharge: Barriers Resolved  Expected Discharge Plan and Services         Expected Discharge Date: 01/12/23                                     Social Determinants of Health (SDOH) Interventions SDOH Screenings   Food Insecurity: No Food Insecurity (01/12/2023)  Housing: Low Risk  (01/12/2023)  Transportation Needs: No Transportation Needs (01/12/2023)  Utilities: Not At Risk (01/12/2023)  Alcohol Screen: Low Risk  (08/10/2021)  Depression (PHQ2-9): Low Risk  (03/26/2022)  Financial Resource Strain: Low Risk  (03/26/2022)  Physical Activity: Insufficiently Active (08/10/2021)  Social Connections: Moderately Integrated (03/26/2022)  Stress: No Stress Concern Present (03/26/2022)  Tobacco Use: Medium Risk (01/10/2023)    Readmission Risk Interventions     No data to display

## 2023-01-14 NOTE — Plan of Care (Signed)
[facility] Tonsillectomy/Adenoidectomy, Postoperative PEDS care plan entered in error.

## 2023-01-14 NOTE — Progress Notes (Signed)
Physical Therapy Treatment Patient Details Name: Candace [name] MRN: [id number] DOB: 04/22/1950 Today's Date: 01/14/2023   History of Present Illness 72 y/o female presented to ED on 01/10/23 for chest pain and fall. Admitted for possible syncope. PMH: HTN, T2DM, anxiety, depression, CAD s/p CABG, hx of CVA, Afib on Eliquis.    PT Comments  Patient continues to be significantly limited by symptoms of dizziness, chest tightness, and headache with any attempts of mobility. See BP below with drop in diastolic BP. Requires minA to steady upon standing and CGA for very short and slow ambulation with RW and patient complaining of feeling like the floor is rising. With rest, patient continues to complain of headache and chest tightness, RN notified. Discharge plan remains appropriate.   Orthostatic BPs  Sitting 120/65  Standing 118/65  Standing x 3 mins 115/48*  Standing after 10' ambulation 122/76*   * = chest tightness, dizziness, and headache   If plan is discharge home, recommend the following: A little help with walking and/or transfers;A little help with bathing/dressing/bathroom;Assistance with cooking/housework;Assist for transportation;Help with stairs or ramp for entrance   Can travel by private vehicle     No  Equipment Recommendations  Rolling [name] (2 wheels)    Recommendations for Other Services       Precautions / Restrictions Precautions Precautions: Fall Precaution Comments: watch BP Restrictions Weight Bearing Restrictions: No     Mobility  Bed Mobility               General bed mobility comments: in recliner on arrival    Transfers Overall transfer level: Needs assistance Equipment used: Rolling [name] (2 wheels) Transfers: Sit to/from Stand Sit to Stand: Min assist           General transfer comment: minA to boost up into standing    Ambulation/Gait Ambulation/Gait assistance: Contact guard assist Gait Distance (Feet): 10 Feet Assistive  device: Rolling [name] (2 wheels) Gait Pattern/deviations: Step-to pattern, Decreased stride length Gait velocity: decreased     General Gait Details: ambulated very slow steady gait speed but reports "floor is rising" and experiencing dizziness, chest tightness, and headache   Stairs             Wheelchair Mobility     Tilt Bed    Modified Rankin (Stroke Patients Only)       Balance Overall balance assessment: Needs assistance Sitting-balance support: Feet supported, No upper extremity supported Sitting balance-[name] Scale: Good     Standing balance support: Bilateral upper extremity supported, Reliant on assistive device for balance Standing balance-[name] Scale: Poor Standing balance comment: needs UE support for balance at all times d/t LE weakness and dizziness                            Cognition Arousal: Alert Behavior During Therapy: WFL for tasks assessed/performed Overall Cognitive Status: Within Functional Limits for tasks assessed                                          Exercises      General Comments        Pertinent Vitals/Pain Pain Assessment Pain Assessment: Faces Faces Pain Scale: Hurts even more Pain Location: headache after mobility Pain Descriptors / Indicators: Headache, Tightness Pain Intervention(s): Limited activity within patient's tolerance, Monitored during session, Repositioned  Home Living                          Prior Function            PT Goals (current goals can now be found in the care plan section) Acute Rehab PT Goals Patient Stated Goal: to go home PT Goal Formulation: With patient Time For Goal Achievement: 01/25/23 Potential to Achieve Goals: Good Progress towards PT goals: Progressing toward goals    Frequency    Min 1X/week      PT Plan      Co-evaluation              AM-PAC PT "6 Clicks" Mobility   Outcome Measure  Help needed turning from  your back to your side while in a flat bed without using bedrails?: A Little Help needed moving from lying on your back to sitting on the side of a flat bed without using bedrails?: A Little Help needed moving to and from a bed to a chair (including a wheelchair)?: A Little Help needed standing up from a chair using your arms (e.g., wheelchair or bedside chair)?: A Little Help needed to walk in hospital room?: A Lot Help needed climbing 3-5 steps with a railing? : A Lot 6 Click Score: 16    End of Session   Activity Tolerance: Treatment limited secondary to medical complications (Comment) (BP and symptoms) Patient left: in chair;with call bell/phone within reach;with family/visitor present Nurse Communication: Mobility status PT Visit Diagnosis: Unsteadiness on feet (R26.81);Muscle weakness (generalized) (M62.81);History of falling (Z91.81)     Time: 6213-0865 PT Time Calculation (min) (ACUTE ONLY): 25 min  Charges:    $Therapeutic Activity: 23-37 mins PT General Charges $$ ACUTE PT VISIT: 1 Visit                     [name], PT, DPT Physical Therapist - [facility]  [facility]    Kimimila Tauzin A Jenise Iannelli 01/14/2023, 12:22 PM

## 2023-01-14 NOTE — Plan of Care (Signed)
Problem: Education: Goal: Understanding of cardiac disease, CV risk reduction, and recovery process will improve 01/14/2023 1547 by Bary Richard, RN Outcome: Progressing 01/14/2023 0738 by Bary Richard, RN Outcome: Progressing Goal: Individualized Educational Video(s) 01/14/2023 1547 by Bary Richard, RN Outcome: Progressing 01/14/2023 0738 by Bary Richard, RN Outcome: Progressing   Problem: Activity: Goal: Ability to tolerate increased activity will improve 01/14/2023 1547 by Bary Richard, RN Outcome: Progressing 01/14/2023 0738 by Bary Richard, RN Outcome: Progressing   Problem: Cardiac: Goal: Ability to achieve and maintain adequate cardiovascular perfusion will improve 01/14/2023 1547 by Bary Richard, RN Outcome: Progressing 01/14/2023 0738 by Bary Richard, RN Outcome: Progressing   Problem: Health Behavior/Discharge Planning: Goal: Ability to safely manage health-related needs after discharge will improve 01/14/2023 1547 by Bary Richard, RN Outcome: Progressing 01/14/2023 0738 by Bary Richard, RN Outcome: Progressing   Problem: Education: Goal: Knowledge of disease or condition will improve 01/14/2023 1547 by Bary Richard, RN Outcome: Progressing 01/14/2023 0738 by Bary Richard, RN Outcome: Progressing Goal: Knowledge of secondary prevention will improve (MUST DOCUMENT ALL) 01/14/2023 1547 by Bary Richard, RN Outcome: Progressing 01/14/2023 0738 by Bary Richard, RN Outcome: Progressing Goal: Knowledge of patient specific risk factors will improve Loraine Leriche N/A or DELETE if not current risk factor) 01/14/2023 1547 by Bary Richard, RN Outcome: Progressing 01/14/2023 0738 by Bary Richard, RN Outcome: Progressing   Problem: Ischemic Stroke/TIA Tissue Perfusion: Goal: Complications of ischemic stroke/TIA will be minimized 01/14/2023 1547 by Bary Richard, RN Outcome: Progressing 01/14/2023 0738 by Bary Richard,  RN Outcome: Progressing   Problem: Coping: Goal: Will verbalize positive feelings about self 01/14/2023 1547 by Bary Richard, RN Outcome: Progressing 01/14/2023 0738 by Bary Richard, RN Outcome: Progressing Goal: Will identify appropriate support needs 01/14/2023 1547 by Bary Richard, RN Outcome: Progressing 01/14/2023 0738 by Bary Richard, RN Outcome: Progressing   Problem: Health Behavior/Discharge Planning: Goal: Ability to manage health-related needs will improve 01/14/2023 1547 by Bary Richard, RN Outcome: Progressing 01/14/2023 0738 by Bary Richard, RN Outcome: Progressing Goal: Goals will be collaboratively established with patient/family 01/14/2023 1547 by Bary Richard, RN Outcome: Progressing 01/14/2023 0738 by Bary Richard, RN Outcome: Progressing   Problem: Self-Care: Goal: Ability to participate in self-care as condition permits will improve 01/14/2023 1547 by Bary Richard, RN Outcome: Progressing 01/14/2023 0738 by Bary Richard, RN Outcome: Progressing Goal: Verbalization of feelings and concerns over difficulty with self-care will improve 01/14/2023 1547 by Bary Richard, RN Outcome: Progressing 01/14/2023 0738 by Bary Richard, RN Outcome: Progressing Goal: Ability to communicate needs accurately will improve 01/14/2023 1547 by Bary Richard, RN Outcome: Progressing 01/14/2023 0738 by Bary Richard, RN Outcome: Progressing   Problem: Nutrition: Goal: Risk of aspiration will decrease 01/14/2023 1547 by Bary Richard, RN Outcome: Progressing 01/14/2023 0738 by Bary Richard, RN Outcome: Progressing Goal: Dietary intake will improve 01/14/2023 1547 by Bary Richard, RN Outcome: Progressing 01/14/2023 0738 by Bary Richard, RN Outcome: Progressing   Problem: Education: Goal: Ability to describe self-care measures that may prevent or decrease complications (Diabetes Survival Skills Education) will  improve 01/14/2023 1547 by Bary Richard, RN Outcome: Progressing 01/14/2023 0738 by Bary Richard, RN Outcome: Progressing Goal: Individualized Educational Video(s) 01/14/2023 1547 by Bary Richard, RN Outcome: Progressing 01/14/2023 0738 by Bary Richard, RN Outcome: Progressing   Problem: Coping: Goal: Ability to adjust to condition or change in health will improve 01/14/2023 1547 by Bary Richard, RN Outcome: Progressing 01/14/2023 0738 by Bary Richard, RN Outcome: Progressing   Problem: Fluid Volume: Goal: Ability to maintain a balanced intake and  output will improve 01/14/2023 1547 by Bary Richard, RN Outcome: Progressing 01/14/2023 0738 by Bary Richard, RN Outcome: Progressing   Problem: Health Behavior/Discharge Planning: Goal: Ability to identify and utilize available resources and services will improve 01/14/2023 1547 by Bary Richard, RN Outcome: Progressing 01/14/2023 0738 by Bary Richard, RN Outcome: Progressing Goal: Ability to manage health-related needs will improve 01/14/2023 1547 by Bary Richard, RN Outcome: Progressing 01/14/2023 0738 by Bary Richard, RN Outcome: Progressing   Problem: Metabolic: Goal: Ability to maintain appropriate glucose levels will improve 01/14/2023 1547 by Bary Richard, RN Outcome: Progressing 01/14/2023 0738 by Bary Richard, RN Outcome: Progressing   Problem: Nutritional: Goal: Maintenance of adequate nutrition will improve 01/14/2023 1547 by Bary Richard, RN Outcome: Progressing 01/14/2023 0738 by Bary Richard, RN Outcome: Progressing Goal: Progress toward achieving an optimal weight will improve 01/14/2023 1547 by Bary Richard, RN Outcome: Progressing 01/14/2023 0738 by Bary Richard, RN Outcome: Progressing   Problem: Skin Integrity: Goal: Risk for impaired skin integrity will decrease 01/14/2023 1547 by Bary Richard, RN Outcome: Progressing 01/14/2023 0738 by  Bary Richard, RN Outcome: Progressing   Problem: Tissue Perfusion: Goal: Adequacy of tissue perfusion will improve 01/14/2023 1547 by Bary Richard, RN Outcome: Progressing 01/14/2023 0738 by Bary Richard, RN Outcome: Progressing   Problem: Education: Goal: Knowledge of General Education information will improve Description: Including pain rating scale, medication(s)/side effects and non-pharmacologic comfort measures 01/14/2023 1547 by Bary Richard, RN Outcome: Progressing 01/14/2023 0738 by Bary Richard, RN Outcome: Progressing   Problem: Health Behavior/Discharge Planning: Goal: Ability to manage health-related needs will improve 01/14/2023 1547 by Bary Richard, RN Outcome: Progressing 01/14/2023 0738 by Bary Richard, RN Outcome: Progressing   Problem: Clinical Measurements: Goal: Ability to maintain clinical measurements within normal limits will improve 01/14/2023 1547 by Bary Richard, RN Outcome: Progressing 01/14/2023 0738 by Bary Richard, RN Outcome: Progressing Goal: Will remain free from infection 01/14/2023 1547 by Bary Richard, RN Outcome: Progressing 01/14/2023 0738 by Bary Richard, RN Outcome: Progressing Goal: Diagnostic test results will improve 01/14/2023 1547 by Bary Richard, RN Outcome: Progressing 01/14/2023 0738 by Bary Richard, RN Outcome: Progressing Goal: Respiratory complications will improve 01/14/2023 1547 by Bary Richard, RN Outcome: Progressing 01/14/2023 0738 by Bary Richard, RN Outcome: Progressing Goal: Cardiovascular complication will be avoided 01/14/2023 1547 by Bary Richard, RN Outcome: Progressing 01/14/2023 0738 by Bary Richard, RN Outcome: Progressing   Problem: Activity: Goal: Risk for activity intolerance will decrease 01/14/2023 1547 by Bary Richard, RN Outcome: Progressing 01/14/2023 0738 by Bary Richard, RN Outcome: Progressing   Problem: Nutrition: Goal:  Adequate nutrition will be maintained 01/14/2023 1547 by Bary Richard, RN Outcome: Progressing 01/14/2023 0738 by Bary Richard, RN Outcome: Progressing   Problem: Coping: Goal: Level of anxiety will decrease 01/14/2023 1547 by Bary Richard, RN Outcome: Progressing 01/14/2023 0738 by Bary Richard, RN Outcome: Progressing   Problem: Elimination: Goal: Will not experience complications related to bowel motility 01/14/2023 1547 by Bary Richard, RN Outcome: Progressing 01/14/2023 0738 by Bary Richard, RN Outcome: Progressing Goal: Will not experience complications related to urinary retention 01/14/2023 1547 by Bary Richard, RN Outcome: Progressing 01/14/2023 0738 by Bary Richard, RN Outcome: Progressing   Problem: Pain Managment: Goal: General experience of comfort will improve 01/14/2023 1547 by Bary Richard, RN Outcome: Progressing 01/14/2023 0738 by Bary Richard, RN Outcome: Progressing   Problem: Safety: Goal: Ability to remain free from injury will improve 01/14/2023 1547 by Bary Richard, RN Outcome: Progressing 01/14/2023 0738 by Bary Richard, RN Outcome: Progressing   Problem: Skin Integrity:  Goal: Risk for impaired skin integrity will decrease 01/14/2023 1547 by Bary Richard, RN Outcome: Progressing 01/14/2023 0738 by Bary Richard, RN Outcome: Progressing   Problem: Education: Goal: Understanding of post-operative needs will improve 01/14/2023 1547 by Bary Richard, RN Outcome: Progressing 01/14/2023 0738 by Bary Richard, RN Outcome: Progressing Goal: Individualized Educational Video(s) 01/14/2023 1547 by Bary Richard, RN Outcome: Progressing 01/14/2023 0738 by Bary Richard, RN Outcome: Progressing

## 2023-01-14 NOTE — TOC Transition Note (Signed)
Transition of Care [facility]) - CM/SW Discharge Note   Patient Details  Name: Candace [name] MRN: [id number] Date of Birth: [birth year]  Transition of Care [facility]) CM/SW Contact:  Truddie Hidden, RN Phone Number: 01/14/2023, 10:48 AM   Clinical Narrative:    Spoke with Raoul Pitch  in admissions at  [facility] Per facility patient admission confirmed for today. Patient assigned room # 702 Nurse will call report to [phone] Face sheet and medical necessity forms printed to the floor to be added to the EMS pack EMS arranged 11:30pm  Discharge summary and SNF transfer report sent in HUB.  Nurse, and family notified spoke with Maralyn Sago TOC signing off.     Final next level of care: Skilled Nursing Facility Barriers to Discharge: Barriers Resolved   Patient Goals and CMS Choice      Discharge Placement                Patient chooses bed at: [facility] Patient to be transferred to facility by: [facility] Name of family member notified: [name] Patient and family notified of of transfer: 01/14/23  Discharge Plan and Services Additional resources added to the After Visit Summary for                                       Social Determinants of Health (SDOH) Interventions SDOH Screenings   Food Insecurity: No Food Insecurity (01/12/2023)  Housing: Low Risk  (01/12/2023)  Transportation Needs: No Transportation Needs (01/12/2023)  Utilities: Not At Risk (01/12/2023)  Alcohol Screen: Low Risk  (08/10/2021)  Depression (PHQ2-9): Low Risk  (03/26/2022)  Financial Resource Strain: Low Risk  (03/26/2022)  Physical Activity: Insufficiently Active (08/10/2021)  Social Connections: Moderately Integrated (03/26/2022)  Stress: No Stress Concern Present (03/26/2022)  Tobacco Use: Medium Risk (01/10/2023)     Readmission Risk Interventions     No data to display

## 2023-01-14 NOTE — Discharge Summary (Signed)
Physician Discharge Summary   Patient: Candace [name] MRN: [id number]  DOB: 18-Feb-1951   Admit:     Date of Admission: 01/10/2023 Admitted from: home   Discharge: Date of discharge: 01/14/23 Disposition: Skilled nursing facility Condition at discharge: good  CODE STATUS: FULL CODE      Discharge Physician: Sunnie Nielsen, DO [facility]     PCP: Bosie Clos, MD  Recommendations for Outpatient Follow-up:  Follow up with PCP Bosie Clos, MD in 1-2 weeks Please obtain labs/tests: CBC, BMP in 1-2 weeks, monitor BP closely at SNF Please follow up on the following pending results: none Outpatient follow up w/ neurology - suspect early Parkinson's disease vs Parkinsonian syndrome Outpatient f/u w/ cardiology to review Zio monitor results - see follow up providers in AVS    Discharge Instructions     Diet - low sodium heart healthy   Complete by: As directed    Diet Carb Modified   Complete by: As directed    Increase activity slowly   Complete by: As directed          Discharge Diagnoses: Principal Problem:   Syncope Active Problems:   Chest pain   Type 2 diabetes mellitus with diabetic polyneuropathy, with long-term current use of insulin (HCC)   Paroxysmal atrial fibrillation (HCC)   Essential hypertension   Gastroesophageal reflux disease   Mixed hyperlipidemia   Coronary artery disease   S/P CABG x 4   Tremors of nervous system   Mild obstructive sleep apnea   COPD suggested by initial evaluation (HCC)   TIA (transient ischemic attack)   Obesity (BMI 30-39.9)   Back pain       Hospital course / significant events:  [name] is a 72 year old female with history of hyperlipidemia, insulin-dependent diabetes mellitus, hypertension, GERD, depression, anxiety, on anticoagulation, CAD status post CABG LIMA to LAD, SVG to PDA and OM, DES to SVG in 2021, and balloon angioplasty at outside hospital in 2023, history of  CVA, paroxysmal atrial fibrillation, on Eliquis, who presents emergency department for chief concerns of chest pain and increased falling.  10/14: admitted to hospitalist service, syncope d/t orthostatic hypotension, question arrhythmia. Troponin trended negative, CP resolved. MRI brain no concerns. Imaging T/L-spine and sacrum/coccyx no concerns.  10/15: PT/OT eval, recs for HH. Cardiology recs for 14-day cardiac monitoring, meds as below  10/16: significant orthostatic drop BP standing w/ dizziness symptoms. PT/OT update recs for SNF. Lowered dose isosorbide  10/17: pt feeling better today, awaiting SNF placement  10/18: d/c to SNF rehab  Consultants:  Cardiology   Procedures/Surgeries: none      ASSESSMENT & PLAN:   Syncope secondary to orthostatic hypotension, multiple risk factors  W/u possible arrhythmia Zio patch have been placed for monitoring and for outpatient follow-up PT OT work with patient and patient's blood pressure was positive for orthostatic drop Reduced isosorbide yesterday given orthostatic hypotension    Coronary artery disease S/P CABG x 4 Chest pain-resolved High sensitive troponin were negative x 2. Echo done on 07/22/2022: Showed EF estimated ejection fraction 65 to 70%.  Grade 1 diastolic dysfunction. Home Ranexa 1000 mg p.o. twice daily and propranolol 60 mg daily resumed Of note, propranolol Rx d/t tremor but it doesn't seem to have helped her tremor, suspect parkinsonism and consider d/c this Rx or alternative beta blocker pending cardiology outpatient f/u   Continue atorvastatin 80 mg daily, ezetimibe 10 mg daily, aspirin 81 mg daily,  Eliquis 5 mg p.o. twice daily Reduced isosorbide given orthostatic hypotension    Type 2 diabetes mellitus with diabetic polyneuropathy, with long-term current use of insulin (HCC) Patient takes Jardiance 10 mg daily, Trulicity 1.5 mg subcutaneous injection weekly, Lantus 100 units subcutaneous nightly Continue insulin  SSI with at bedtime coverage ordered   Paroxysmal atrial fibrillation (HCC) Continue Eliquis  Back pain secondary to fall CT scan of the lumbar and thoracic spine all within normal limit with no fractures Continue as needed pain medication    Mild obstructive sleep apnea Continue CPAP nightly    Parkinson syndrome Can also contribute to unsteady gait / fall risk Continue outpatient follow-up    Hyperlipidemia Continue atorvastatin 80 mg daily   Gastroesophageal reflux disease Protonix 20 mg p.o. twice daily resumed             Discharge Instructions  Allergies as of 01/14/2023       Reactions   Tramadol Other (See Comments)   Causes patient to be off balance and Mental Changes   Lisinopril Cough   Penicillins Swelling, Rash, Other (See Comments)   Did it involve swelling of the face/tongue/throat, SOB, or low BP? Yes Did it involve sudden or severe rash/hives, skin peeling, or any reaction on the inside of your mouth or nose? Yes Did you need to seek medical attention at a hospital or doctor's office? Yes When did it last happen?      15 years If all above answers are "NO", may proceed with cephalosporin use.        Medication List     STOP taking these medications    loperamide 2 MG capsule Commonly known as: IMODIUM   metoprolol tartrate 50 MG tablet Commonly known as: LOPRESSOR       TAKE these medications    acetaminophen 325 MG tablet Commonly known as: TYLENOL Take 650 mg by mouth every 6 (six) hours as needed for moderate pain or headache.   albuterol 108 (90 Base) MCG/ACT inhaler Commonly known as: VENTOLIN HFA Inhale 2 puffs into the lungs every 6 (six) hours as needed for wheezing or shortness of breath.   aspirin EC 81 MG tablet Take 81 mg by mouth daily. Swallow whole.   atorvastatin 80 MG tablet Commonly known as: LIPITOR Take 1 tablet (80 mg total) by mouth daily.   Comfort EZ Pen Needles 32G X 4 MM Misc Generic drug:  Insulin Pen Needle Use to inject insulin daily   Eliquis 5 MG Tabs tablet Generic drug: apixaban Take 5 mg by mouth 2 (two) times daily.   ezetimibe 10 MG tablet Commonly known as: ZETIA Take 1 tablet (10 mg total) by mouth daily.   glucose blood test strip Use to check blood sugars daily as instructed   isosorbide mononitrate 30 MG 24 hr tablet Commonly known as: IMDUR Take 1 tablet (30 mg total) by mouth daily. Start taking on: January 15, 2023 What changed:  medication strength how much to take Another medication with the same name was removed. Continue taking this medication, and follow the directions you see here.   Jardiance 10 MG Tabs tablet Generic drug: empagliflozin Take 25 mg by mouth every morning.   Lantus SoloStar 100 UNIT/ML Solostar Pen Generic drug: insulin glargine Inject 12 Units into the skin at bedtime. What changed: how much to take   nitroGLYCERIN 0.4 MG SL tablet Commonly known as: NITROSTAT Place 1 tablet (0.4 mg total) under the tongue every 5 (five) minutes  as needed for chest pain.   nystatin powder Commonly known as: nystatin Apply 1 application topically daily.   ondansetron 8 MG disintegrating tablet Commonly known as: ZOFRAN-ODT TAKE ONE TABLET BY MOUTH EVERY 8 HOURS AS NEEDED FOR NAUSEA AND VOMITING   OneTouch Delica Plus Lancet33G Misc USE UP TO 4 TIMES DAILY AS DIRECTED   OneTouch Verio w/Device Kit Use daily to check blood sugar   pantoprazole 20 MG tablet Commonly known as: Protonix Take 1 tablet (20 mg total) by mouth 2 (two) times daily.   propranolol ER 60 MG 24 hr capsule Commonly known as: INDERAL LA Take 60 mg by mouth daily.   ranolazine 1000 MG SR tablet Commonly known as: RANEXA Take 1 tablet (1,000 mg total) by mouth 2 (two) times daily.   sertraline 100 MG tablet Commonly known as: ZOLOFT Take 1 tablet (100 mg total) by mouth daily. What changed: when to take this   Trulicity 1.5 MG/0.5ML Sopn Generic  drug: Dulaglutide Inject 1.5 mg into the skin once a week. Typically takes Weds, ok to administer 01/14/23 has not had it this week, can resume weekly on Fridays after today's dose What changed: additional instructions         Follow-up Information     Callwood, Dwayne D, MD. Go in 1 week(s).   Specialties: Cardiology, Internal Medicine Contact information: [address] [place] [address] [phone]                 Allergies  Allergen Reactions   Tramadol Other (See Comments)    Causes patient to be off balance and Mental Changes   Lisinopril Cough   Penicillins Swelling, Rash and Other (See Comments)    Did it involve swelling of the face/tongue/throat, SOB, or low BP? Yes Did it involve sudden or severe rash/hives, skin peeling, or any reaction on the inside of your mouth or nose? Yes Did you need to seek medical attention at a hospital or doctor's office? Yes When did it last happen?      15 years If all above answers are "NO", may proceed with cephalosporin use.      Subjective: pt states feeling better today than she has in some time, no concerns for discharge. No CP/SOB, no HA/VC,dizziness, good appetitie, no abd pain no N/V, no edema, no focal weakness    Discharge Exam: BP (!) 151/74 (BP Location: Right Arm)   Pulse 78   Temp 98.2 F (36.8 C)   Resp 20   Ht 5\' 2"  (1.575 m)   Wt 74.8 kg   SpO2 99%   BMI 30.18 kg/m  General: Pt is alert, awake, not in acute distress Cardiovascular: RRR, S1/S2 +, no rubs, no gallops Respiratory: CTA bilaterally, no wheezing, no rhonchi Abdominal: Soft, NT, ND, bowel sounds + Extremities: no edema, no cyanosis Neuro: tremor at rest in hand, head tremor, halting speech but clear mentation      The results of significant diagnostics from this hospitalization (including imaging, microbiology, ancillary and laboratory) are listed below for reference.     Microbiology: Recent Results (from the past 240  hour(s))  SARS Coronavirus 2 by RT PCR (hospital order, performed in [facility] hospital lab) *cepheid single result test* Anterior Nasal Swab     Status: None   Collection Time: 01/10/23 10:57 AM   Specimen: Anterior Nasal Swab  Result Value Ref Range Status   SARS Coronavirus 2 by RT PCR NEGATIVE NEGATIVE Final    Comment: (  NOTE) SARS-CoV-2 target nucleic acids are NOT DETECTED.  The SARS-CoV-2 RNA is generally detectable in upper and lower respiratory specimens during the acute phase of infection. The lowest concentration of SARS-CoV-2 viral copies this assay can detect is 250 copies / mL. A negative result does not preclude SARS-CoV-2 infection and should not be used as the sole basis for treatment or other patient management decisions.  A negative result may occur with improper specimen collection / handling, submission of specimen other than nasopharyngeal swab, presence of viral mutation(s) within the areas targeted by this assay, and inadequate number of viral copies (<250 copies / mL). A negative result must be combined with clinical observations, patient history, and epidemiological information.  Fact Sheet for Patients:   RoadLapTop.co.za  Fact Sheet for Healthcare Providers: http://kim-miller.com/  This test is not yet approved or  cleared by the [place] FDA and has been authorized for detection and/or diagnosis of SARS-CoV-2 by FDA under an Emergency Use Authorization (EUA).  This EUA will remain in effect (meaning this test can be used) for the duration of the COVID-19 declaration under Section 564(b)(1) of the Act, 21 U.S.C. section 360bbb-3(b)(1), unless the authorization is terminated or revoked sooner.  Performed at [facility], [address] [place], [place] [address]      Labs: BNP (last 3 results) No results for input(s): "BNP" in the last 8760 hours. Basic Metabolic Panel: Recent Labs   Lab 01/10/23 0933 01/11/23 0417 01/12/23 0243  NA 143 139 141  K 4.2 3.5 3.3*  CL 108 106 109  CO2 27 25 25   GLUCOSE 175* 94 122*  BUN 17 15 12   CREATININE 0.86 0.95 0.83  CALCIUM 8.7* 8.4* 8.4*   Liver Function Tests: Recent Labs  Lab 01/10/23 0933  AST 17  ALT 15  ALKPHOS 130*  BILITOT 0.6  PROT 7.1  ALBUMIN 3.9   No results for input(s): "LIPASE", "AMYLASE" in the last 168 hours. No results for input(s): "AMMONIA" in the last 168 hours. CBC: Recent Labs  Lab 01/10/23 0933 01/11/23 0417 01/12/23 0243  WBC 8.3 6.5 5.3  NEUTROABS  --   --  1.9  HGB 12.5 11.4* 11.3*  HCT 38.6 35.1* 34.6*  MCV 102.7* 102.3* 101.5*  PLT 182 172 167   Cardiac Enzymes: Recent Labs  Lab 01/10/23 0933  CKTOTAL 55   BNP: Invalid input(s): "POCBNP" CBG: Recent Labs  Lab 01/13/23 0744 01/13/23 1151 01/13/23 1709 01/13/23 2103 01/14/23 0750  GLUCAP 112* 167* 140* 123* 121*   D-Dimer No results for input(s): "DDIMER" in the last 72 hours. Hgb A1c No results for input(s): "HGBA1C" in the last 72 hours. Lipid Profile No results for input(s): "CHOL", "HDL", "LDLCALC", "TRIG", "CHOLHDL", "LDLDIRECT" in the last 72 hours. Thyroid function studies No results for input(s): "TSH", "T4TOTAL", "T3FREE", "THYROIDAB" in the last 72 hours.  Invalid input(s): "FREET3" Anemia work up No results for input(s): "VITAMINB12", "FOLATE", "FERRITIN", "TIBC", "IRON", "RETICCTPCT" in the last 72 hours. Urinalysis    Component Value Date/Time   COLORURINE YELLOW (A) 01/10/2023 1620   APPEARANCEUR CLEAR (A) 01/10/2023 1620   APPEARANCEUR Clear 12/24/2014 0905   LABSPEC 1.027 01/10/2023 1620   LABSPEC 1.030 09/14/2013 2123   PHURINE 7.0 01/10/2023 1620   GLUCOSEU >=500 (A) 01/10/2023 1620   GLUCOSEU Negative 09/14/2013 2123   HGBUR NEGATIVE 01/10/2023 1620   BILIRUBINUR NEGATIVE 01/10/2023 1620   BILIRUBINUR Negative 02/06/2020 1014   BILIRUBINUR Negative 12/24/2014 0905   BILIRUBINUR  Negative 09/14/2013  2123   KETONESUR NEGATIVE 01/10/2023 1620   PROTEINUR NEGATIVE 01/10/2023 1620   UROBILINOGEN 0.2 02/06/2020 1014   NITRITE NEGATIVE 01/10/2023 1620   LEUKOCYTESUR NEGATIVE 01/10/2023 1620   LEUKOCYTESUR 3+ 09/14/2013 2123   Sepsis Labs Recent Labs  Lab 01/10/23 0933 01/11/23 0417 01/12/23 0243  WBC 8.3 6.5 5.3   Microbiology Recent Results (from the past 240 hour(s))  SARS Coronavirus 2 by RT PCR (hospital order, performed in [facility] hospital lab) *cepheid single result test* Anterior Nasal Swab     Status: None   Collection Time: 01/10/23 10:57 AM   Specimen: Anterior Nasal Swab  Result Value Ref Range Status   SARS Coronavirus 2 by RT PCR NEGATIVE NEGATIVE Final    Comment: (NOTE) SARS-CoV-2 target nucleic acids are NOT DETECTED.  The SARS-CoV-2 RNA is generally detectable in upper and lower respiratory specimens during the acute phase of infection. The lowest concentration of SARS-CoV-2 viral copies this assay can detect is 250 copies / mL. A negative result does not preclude SARS-CoV-2 infection and should not be used as the sole basis for treatment or other patient management decisions.  A negative result may occur with improper specimen collection / handling, submission of specimen other than nasopharyngeal swab, presence of viral mutation(s) within the areas targeted by this assay, and inadequate number of viral copies (<250 copies / mL). A negative result must be combined with clinical observations, patient history, and epidemiological information.  Fact Sheet for Patients:   RoadLapTop.co.za  Fact Sheet for Healthcare Providers: http://kim-miller.com/  This test is not yet approved or  cleared by the [place] FDA and has been authorized for detection and/or diagnosis of SARS-CoV-2 by FDA under an Emergency Use Authorization (EUA).  This EUA will remain in effect (meaning this test can be  used) for the duration of the COVID-19 declaration under Section 564(b)(1) of the Act, 21 U.S.C. section 360bbb-3(b)(1), unless the authorization is terminated or revoked sooner.  Performed at [facility], [address] [place], [place] [address]    Imaging CT LUMBAR SPINE WO CONTRAST  Result Date: 01/10/2023 CLINICAL DATA:  Low back pain. Tenderness over T9-T11 and L2-L3 area. EXAM: CT THORACIC AND LUMBAR SPINE WITHOUT CONTRAST TECHNIQUE: Multidetector CT imaging of the thoracic and lumbar spine was performed without contrast. Multiplanar CT image reconstructions were also generated. RADIATION DOSE REDUCTION: This exam was performed according to the departmental dose-optimization program which includes automated exposure control, adjustment of the mA and/or kV according to patient size and/or use of iterative reconstruction technique. COMPARISON:  CT abdomen pelvis dated 07/01/2022 and chest CT dated 08/11/2021. FINDINGS: Evaluation for fracture is limited due to osteopenia. CT THORACIC SPINE FINDINGS Alignment: No acute subluxation. Vertebrae: No acute fracture.  Osteopenia. Paraspinal and other soft tissues: No perispinal fluid collection. There is coronary vascular calcification. Atherosclerotic calcification of the aorta. Disc levels: No acute findings.  Degenerative changes. CT LUMBAR SPINE FINDINGS Segmentation: 5 lumbar type vertebrae. Alignment: No acute subluxation. Vertebrae: No acute fracture. The bones are osteopenic which limits evaluation for fracture. Paraspinal and other soft tissues: No paraspinal fluid collection. Atherosclerotic calcification of the aorta. Disc levels: No acute findings. Multilevel degenerative changes with disc desiccation and vacuum phenomena. IMPRESSION: 1. No acute/traumatic thoracic or lumbar spine pathology. 2. Osteopenia. 3.  Aortic Atherosclerosis (ICD10-I70.0). Electronically Signed   By: Elgie Collard M.D.   On: 01/10/2023 23:22   CT  THORACIC SPINE WO CONTRAST  Result Date: 01/10/2023 CLINICAL DATA:  Low back  pain. Tenderness over T9-T11 and L2-L3 area. EXAM: CT THORACIC AND LUMBAR SPINE WITHOUT CONTRAST TECHNIQUE: Multidetector CT imaging of the thoracic and lumbar spine was performed without contrast. Multiplanar CT image reconstructions were also generated. RADIATION DOSE REDUCTION: This exam was performed according to the departmental dose-optimization program which includes automated exposure control, adjustment of the mA and/or kV according to patient size and/or use of iterative reconstruction technique. COMPARISON:  CT abdomen pelvis dated 07/01/2022 and chest CT dated 08/11/2021. FINDINGS: Evaluation for fracture is limited due to osteopenia. CT THORACIC SPINE FINDINGS Alignment: No acute subluxation. Vertebrae: No acute fracture.  Osteopenia. Paraspinal and other soft tissues: No perispinal fluid collection. There is coronary vascular calcification. Atherosclerotic calcification of the aorta. Disc levels: No acute findings.  Degenerative changes. CT LUMBAR SPINE FINDINGS Segmentation: 5 lumbar type vertebrae. Alignment: No acute subluxation. Vertebrae: No acute fracture. The bones are osteopenic which limits evaluation for fracture. Paraspinal and other soft tissues: No paraspinal fluid collection. Atherosclerotic calcification of the aorta. Disc levels: No acute findings. Multilevel degenerative changes with disc desiccation and vacuum phenomena. IMPRESSION: 1. No acute/traumatic thoracic or lumbar spine pathology. 2. Osteopenia. 3.  Aortic Atherosclerosis (ICD10-I70.0). Electronically Signed   By: Elgie Collard M.D.   On: 01/10/2023 23:22   DG Sacrum/Coccyx  Result Date: 01/10/2023 CLINICAL DATA:  Sacral pain after fall EXAM: SACRUM AND COCCYX - 2+ VIEW COMPARISON:  None Available. FINDINGS: There is no evidence of fracture or other focal bone lesions. IMPRESSION: Negative. Electronically Signed   By: Jasmine Pang M.D.    On: 01/10/2023 22:47   DG Chest Port 1 View  Result Date: 01/10/2023 CLINICAL DATA:  Weakness chest pain EXAM: PORTABLE CHEST 1 VIEW COMPARISON:  06/30/2022 FINDINGS: Post sternotomy changes. No acute airspace disease or effusion. Stable cardiomediastinal silhouette. No pneumothorax IMPRESSION: No active disease. Electronically Signed   By: Jasmine Pang M.D.   On: 01/10/2023 22:46   MR BRAIN WO CONTRAST  Result Date: 01/10/2023 CLINICAL DATA:  Stroke suspected, dizziness and blurred vision, now resolved, profound weakness with fall EXAM: MRI HEAD WITHOUT CONTRAST TECHNIQUE: Multiplanar, multiecho pulse sequences of the brain and surrounding structures were obtained without intravenous contrast. COMPARISON:  02/11/2022 MRI head, correlation is also made with 01/10/2023 CT head FINDINGS: Brain: No restricted diffusion to suggest acute or subacute infarct. No acute hemorrhage, mass, mass effect, or midline shift. No hydrocephalus or extra-axial collection. Normal pituitary and craniocervical junction. No hemosiderin deposition to suggest remote hemorrhage. Multiple remote lacunar infarcts in the basal ganglia and right cerebellar lobe. T2 hyperintense signal in the periventricular white matter, likely the sequela of chronic small vessel ischemic disease. Vascular: Normal arterial flow voids. Skull and upper cervical spine: Normal marrow signal. Sinuses/Orbits: Mild mucosal thickening in the ethmoid air cells and inferior left frontal sinus. No acute finding in the orbits. Other: The mastoid air cells are well aerated. IMPRESSION: No acute intracranial process. No evidence of acute or subacute infarct. Electronically Signed   By: Wiliam Ke M.D.   On: 01/10/2023 22:23   CT Head Wo Contrast  Result Date: 01/10/2023 CLINICAL DATA:  72 year old female with chest pain, headache, fall, found down. EXAM: CT HEAD WITHOUT CONTRAST TECHNIQUE: Contiguous axial images were obtained from the base of the skull  through the vertex without intravenous contrast. RADIATION DOSE REDUCTION: This exam was performed according to the departmental dose-optimization program which includes automated exposure control, adjustment of the mA and/or kV according to patient size and/or use of iterative reconstruction  technique. COMPARISON:  Brain MRI 02/11/2022.  Head CT 01/01/2022. FINDINGS: Brain: Cerebral volume is within normal limits for age. No midline shift, ventriculomegaly, mass effect, evidence of mass lesion, intracranial hemorrhage or evidence of cortically based acute infarction. Chronic bilateral basal ganglia lacunar infarcts. Chronic encephalomalacia in the body of the corpus callosum. Stable gray-white matter differentiation throughout the brain. Vascular: Calcified atherosclerosis at the skull base. No suspicious intracranial vascular hyperdensity. Skull: Stable, intact. Sinuses/Orbits: Visualized paranasal sinuses and mastoids are stable and well aerated. Other: No orbit or scalp soft tissue injury identified. IMPRESSION: 1. No acute intracranial abnormality or acute traumatic injury identified. 2. Stable non contrast CT appearance of advanced chronic small vessel disease. Electronically Signed   By: Odessa Fleming M.D.   On: 01/10/2023 11:37      Time coordinating discharge: over 30 minutes  SIGNED:  Sunnie Nielsen DO [facility]

## 2023-01-14 NOTE — Progress Notes (Signed)
D/c cancelled, pt c/o chest pain on ambulation, dizziness on ambulation. Increased isosorbide back up, hopefully this will help symptoms but she is likely to have symptoms / autonomic dysfunction going forwards, will see if SNF will be able to take her tomorrow.

## 2023-01-15 DIAGNOSIS — R55 Syncope and collapse: Secondary | ICD-10-CM | POA: Diagnosis not present

## 2023-01-15 LAB — GLUCOSE, CAPILLARY
Glucose-Capillary: 134 mg/dL — ABNORMAL HIGH (ref 70–99)
Glucose-Capillary: 209 mg/dL — ABNORMAL HIGH (ref 70–99)
Glucose-Capillary: 201 mg/dL — ABNORMAL HIGH (ref 70–99)
Glucose-Capillary: 170 mg/dL — ABNORMAL HIGH (ref 70–99)

## 2023-01-15 MED ORDER — CARBIDOPA-LEVODOPA 10-100 MG PO TABS
1.0000 | ORAL_TABLET | Freq: Three times a day (TID) | ORAL | Status: DC
  Administered 2023-01-15 – 2023-01-17 (×6): 1 via ORAL
  Filled 2023-01-15 (×8): qty 1

## 2023-01-15 MED ORDER — NEBIVOLOL HCL 5 MG PO TABS
2.5000 mg | ORAL_TABLET | Freq: Every day | ORAL | Status: DC
  Administered 2023-01-15 – 2023-01-17 (×3): 2.5 mg via ORAL
  Filled 2023-01-15 (×4): qty 1

## 2023-01-15 MED ORDER — CARVEDILOL 3.125 MG PO TABS
3.1250 mg | ORAL_TABLET | Freq: Two times a day (BID) | ORAL | Status: DC
Start: 2023-01-15 — End: 2023-01-15

## 2023-01-15 NOTE — Plan of Care (Signed)
Problem: Education: Goal: Understanding of cardiac disease, CV risk reduction, and recovery process will improve Outcome: Progressing Goal: Individualized Educational Video(s) Outcome: Progressing   Problem: Activity: Goal: Ability to tolerate increased activity will improve Outcome: Progressing   Problem: Cardiac: Goal: Ability to achieve and maintain adequate cardiovascular perfusion will improve Outcome: Progressing   Problem: Health Behavior/Discharge Planning: Goal: Ability to safely manage health-related needs after discharge will improve Outcome: Progressing   Problem: Education: Goal: Knowledge of disease or condition will improve Outcome: Progressing Goal: Knowledge of secondary prevention will improve (MUST DOCUMENT ALL) Outcome: Progressing Goal: Knowledge of patient specific risk factors will improve Loraine Leriche N/A or DELETE if not current risk factor) Outcome: Progressing   Problem: Ischemic Stroke/TIA Tissue Perfusion: Goal: Complications of ischemic stroke/TIA will be minimized Outcome: Progressing   Problem: Coping: Goal: Will verbalize positive feelings about self Outcome: Progressing Goal: Will identify appropriate support needs Outcome: Progressing   Problem: Health Behavior/Discharge Planning: Goal: Ability to manage health-related needs will improve Outcome: Progressing Goal: Goals will be collaboratively established with patient/family Outcome: Progressing   Problem: Self-Care: Goal: Ability to participate in self-care as condition permits will improve Outcome: Progressing Goal: Verbalization of feelings and concerns over difficulty with self-care will improve Outcome: Progressing Goal: Ability to communicate needs accurately will improve Outcome: Progressing   Problem: Nutrition: Goal: Risk of aspiration will decrease Outcome: Progressing Goal: Dietary intake will improve Outcome: Progressing   Problem: Education: Goal: Ability to describe  self-care measures that may prevent or decrease complications (Diabetes Survival Skills Education) will improve Outcome: Progressing Goal: Individualized Educational Video(s) Outcome: Progressing   Problem: Coping: Goal: Ability to adjust to condition or change in health will improve Outcome: Progressing   Problem: Fluid Volume: Goal: Ability to maintain a balanced intake and output will improve Outcome: Progressing   Problem: Health Behavior/Discharge Planning: Goal: Ability to identify and utilize available resources and services will improve Outcome: Progressing Goal: Ability to manage health-related needs will improve Outcome: Progressing   Problem: Metabolic: Goal: Ability to maintain appropriate glucose levels will improve Outcome: Progressing   Problem: Nutritional: Goal: Maintenance of adequate nutrition will improve Outcome: Progressing Goal: Progress toward achieving an optimal weight will improve Outcome: Progressing   Problem: Skin Integrity: Goal: Risk for impaired skin integrity will decrease Outcome: Progressing   Problem: Tissue Perfusion: Goal: Adequacy of tissue perfusion will improve Outcome: Progressing   Problem: Education: Goal: Knowledge of General Education information will improve Description: Including pain rating scale, medication(s)/side effects and non-pharmacologic comfort measures Outcome: Progressing   Problem: Health Behavior/Discharge Planning: Goal: Ability to manage health-related needs will improve Outcome: Progressing   Problem: Clinical Measurements: Goal: Ability to maintain clinical measurements within normal limits will improve Outcome: Progressing Goal: Will remain free from infection Outcome: Progressing Goal: Diagnostic test results will improve Outcome: Progressing Goal: Respiratory complications will improve Outcome: Progressing Goal: Cardiovascular complication will be avoided Outcome: Progressing   Problem:  Activity: Goal: Risk for activity intolerance will decrease Outcome: Progressing   Problem: Nutrition: Goal: Adequate nutrition will be maintained Outcome: Progressing   Problem: Coping: Goal: Level of anxiety will decrease Outcome: Progressing   Problem: Elimination: Goal: Will not experience complications related to bowel motility Outcome: Progressing Goal: Will not experience complications related to urinary retention Outcome: Progressing   Problem: Pain Managment: Goal: General experience of comfort will improve Outcome: Progressing   Problem: Safety: Goal: Ability to remain free from injury will improve Outcome: Progressing   Problem: Skin Integrity: Goal: Risk for impaired  skin integrity will decrease Outcome: Progressing

## 2023-01-15 NOTE — Plan of Care (Signed)
°  Problem: Education: °Goal: Understanding of cardiac disease, CV risk reduction, and recovery process will improve °Outcome: Progressing °Goal: Individualized Educational Video(s) °Outcome: Progressing °  °

## 2023-01-15 NOTE — Progress Notes (Signed)
PROGRESS NOTE    VIRIGINIA CHAMBLESS   ZOX:[id number] DOB: 17-Nov-1950  DOA: 01/10/2023 Date of Service: 01/15/23 which is hospital day 4  PCP: Bosie Clos, MD    HPI: [name] is a 72 year old female with history of hyperlipidemia, insulin-dependent diabetes mellitus, hypertension, GERD, depression, anxiety, on anticoagulation, CAD status post CABG LIMA to LAD, SVG to PDA and OM, DES to SVG in 2021, and balloon angioplasty at outside hospital in 2023, history of CVA, paroxysmal atrial fibrillation, on Eliquis, who presents emergency department for chief concerns of chest pain and increased falling.   Hospital course / significant events:  10/14: admitted to hospitalist service, syncope d/t orthostatic hypotension, question arrhythmia. Troponin trended negative, CP resolved. MRI brain no concerns. Imaging T/L-spine and sacrum/coccyx no concerns.  10/15: PT/OT eval, recs for HH. Cardiology recs for 14-day cardiac monitoring, meds as below  10/16: significant orthostatic drop BP standing w/ dizziness symptoms. PT/OT update recs for SNF. Lowered dose isosorbide  10/17: pt feeling better today, awaiting SNF placement  10/18: was ready for d/c until significant dizziness and CP w/ ambulation - held d/c 10/19: suspect parkinsons, will start low dose sinemet and will d/c propranolol and trial nebivolol to hopefully reduce side effects   Consultants:  Cardiology   Procedures/Surgeries: none      ASSESSMENT & PLAN:   Syncope secondary to orthostatic hypotension, multiple risk factors  W/u possible arrhythmia Zio patch have been placed for monitoring and for outpatient follow-up PT OT work with patient and patient's blood pressure was positive for orthostatic drop Reduced isosorbide but the resumed higher dose given increase chest pain     Coronary artery disease S/P CABG x 4 Chest pain-resolved High sensitive troponin were negative x 2. Echo done on 07/22/2022: Showed EF  estimated ejection fraction 65 to 70%.  Grade 1 diastolic dysfunction. Continue cardiac monitoring Home Ranexa 1000 mg p.o. twice daily  D/c propranolol 60 mg daily, was on this for cardiac but also tremor, hasn't helped tremor, started bisoprolol hopefully this will minimize adverse effects  Continue atorvastatin 80 mg daily, ezetimibe 10 mg daily, aspirin 81 mg daily, Eliquis 5 mg p.o. twice daily Reduced isosorbide yesterday given orthostatic hypotension    Type 2 diabetes mellitus with diabetic polyneuropathy, with long-term current use of insulin (HCC) Patient takes Jardiance 10 mg daily, Trulicity 1.5 mg subcutaneous injection weekly, Lantus 100 units subcutaneous nightly Continue insulin SSI with at bedtime coverage ordered Goal inpatient blood glucose levels 140-180   Paroxysmal atrial fibrillation (HCC) Continue Eliquis  Back pain Query secondary to fall CT scan of the lumbar and thoracic spine all within normal limit with no fractures Continue as needed pain medication    Mild obstructive sleep apnea Continue CPAP nightly    Parkinson syndrome Can also contribute to unsteady gait / fall risk Continue outpatient follow-up Have started low dose sinemet here    Hyperlipidemia Continue atorvastatin 80 mg daily   Gastroesophageal reflux disease Protonix 20 mg p.o. twice daily resumed    obesity based on BMI: Body mass index is 30.18 kg/m.   DVT prophylaxis: eliquis IV fluids: no continuous IV fluids  Nutrition: cardiac diet  Central lines / invasive devices: none  Code Status: FULL CODE ACP documentation reviewed: DNR order is on file in [facility] was voided after confirming FULL CODE w/ patient   TOC needs: SNF placement  Barriers to dispo / significant pending items: placement, monitro BP on lower isosorbide  Subjective / Brief ROS:  Patient reports feeling okay today hasn't been up walking yet  Denies CP/SOB.  Pain controlled.  Denies  new weakness.  Tolerating diet.  Reports no concerns w/ urination/defecation.   Family Communication: none at this time     Objective Findings:  Vitals:   01/15/23 0000 01/15/23 0400 01/15/23 0848 01/15/23 1157  BP: (!) 152/79 (!) 149/68 125/79 117/63  Pulse: 73 77 87 81  Resp: 13 17 18 18   Temp: 98 F (36.7 C) 98.4 F (36.9 C) 98.2 F (36.8 C) 98.4 F (36.9 C)  TempSrc: Oral Oral    SpO2: 98% 98% 100% 94%  Weight:      Height:        Intake/Output Summary (Last 24 hours) at 01/15/2023 1515 Last data filed at 01/14/2023 2000 Gross per 24 hour  Intake 200 ml  Output --  Net 200 ml   Filed Weights   01/10/23 0910  Weight: 74.8 kg    Examination:  Physical Exam Constitutional:      General: She is not in acute distress. Cardiovascular:     Rate and Rhythm: Normal rate and regular rhythm.  Pulmonary:     Effort: Pulmonary effort is normal.     Breath sounds: Normal breath sounds.  Musculoskeletal:     Right lower leg: No edema.     Left lower leg: No edema.  Neurological:     Mental Status: She is alert.     Motor: Tremor present.          Scheduled Medications:    stroke: early stages of recovery book   Does not apply Once   apixaban  5 mg Oral BID   aspirin EC  81 mg Oral Daily   atorvastatin  80 mg Oral Daily   carbidopa-levodopa  1 tablet Oral TID   ezetimibe  10 mg Oral QHS   insulin aspart  0-15 Units Subcutaneous TID WC   insulin aspart  0-5 Units Subcutaneous QHS   insulin glargine-yfgn  12 Units Subcutaneous QHS   isosorbide mononitrate  120 mg Oral Daily   nebivolol  2.5 mg Oral Daily   pantoprazole  20 mg Oral BID   ranolazine  1,000 mg Oral BID   sertraline  100 mg Oral QHS    Continuous Infusions:   PRN Medications:  acetaminophen **OR** acetaminophen (TYLENOL) oral liquid 160 mg/5 mL **OR** acetaminophen, albuterol, ALPRAZolam, lidocaine, nitroGLYCERIN, ondansetron (ZOFRAN) IV, senna-docusate  Antimicrobials from admission:   Anti-infectives (From admission, onward)    None           Data Reviewed:  I have personally reviewed the following...  CBC: Recent Labs  Lab 01/10/23 0933 01/11/23 0417 01/12/23 0243  WBC 8.3 6.5 5.3  NEUTROABS  --   --  1.9  HGB 12.5 11.4* 11.3*  HCT 38.6 35.1* 34.6*  MCV 102.7* 102.3* 101.5*  PLT 182 172 167   Basic Metabolic Panel: Recent Labs  Lab 01/10/23 0933 01/11/23 0417 01/12/23 0243  NA 143 139 141  K 4.2 3.5 3.3*  CL 108 106 109  CO2 27 25 25   GLUCOSE 175* 94 122*  BUN 17 15 12   CREATININE 0.86 0.95 0.83  CALCIUM 8.7* 8.4* 8.4*   GFR: Estimated Creatinine Clearance: 58.9 mL/min (by C-G formula based on SCr of 0.83 mg/dL). Liver Function Tests: Recent Labs  Lab 01/10/23 0933  AST 17  ALT 15  ALKPHOS 130*  BILITOT 0.6  PROT 7.1  ALBUMIN  3.9   No results for input(s): "LIPASE", "AMYLASE" in the last 168 hours. No results for input(s): "AMMONIA" in the last 168 hours. Coagulation Profile: No results for input(s): "INR", "PROTIME" in the last 168 hours. Cardiac Enzymes: Recent Labs  Lab 01/10/23 0933  CKTOTAL 55   BNP (last 3 results) No results for input(s): "PROBNP" in the last 8760 hours. HbA1C: No results for input(s): "HGBA1C" in the last 72 hours. CBG: Recent Labs  Lab 01/14/23 1139 01/14/23 1534 01/14/23 1956 01/15/23 0850 01/15/23 1155  GLUCAP 208* 223* 201* 134* 209*   Lipid Profile: No results for input(s): "CHOL", "HDL", "LDLCALC", "TRIG", "CHOLHDL", "LDLDIRECT" in the last 72 hours. Thyroid Function Tests: No results for input(s): "TSH", "T4TOTAL", "FREET4", "T3FREE", "THYROIDAB" in the last 72 hours. Anemia Panel: No results for input(s): "VITAMINB12", "FOLATE", "FERRITIN", "TIBC", "IRON", "RETICCTPCT" in the last 72 hours. Most Recent Urinalysis On File:     Component Value Date/Time   COLORURINE YELLOW (A) 01/10/2023 1620   APPEARANCEUR CLEAR (A) 01/10/2023 1620   APPEARANCEUR Clear 12/24/2014 0905    LABSPEC 1.027 01/10/2023 1620   LABSPEC 1.030 09/14/2013 2123   PHURINE 7.0 01/10/2023 1620   GLUCOSEU >=500 (A) 01/10/2023 1620   GLUCOSEU Negative 09/14/2013 2123   HGBUR NEGATIVE 01/10/2023 1620   BILIRUBINUR NEGATIVE 01/10/2023 1620   BILIRUBINUR Negative 02/06/2020 1014   BILIRUBINUR Negative 12/24/2014 0905   BILIRUBINUR Negative 09/14/2013 2123   KETONESUR NEGATIVE 01/10/2023 1620   PROTEINUR NEGATIVE 01/10/2023 1620   UROBILINOGEN 0.2 02/06/2020 1014   NITRITE NEGATIVE 01/10/2023 1620   LEUKOCYTESUR NEGATIVE 01/10/2023 1620   LEUKOCYTESUR 3+ 09/14/2013 2123   Sepsis Labs: @LABRCNTIP (procalcitonin:4,lacticidven:4) Microbiology: Recent Results (from the past 240 hour(s))  SARS Coronavirus 2 by RT PCR (hospital order, performed in [facility] hospital lab) *cepheid single result test* Anterior Nasal Swab     Status: None   Collection Time: 01/10/23 10:57 AM   Specimen: Anterior Nasal Swab  Result Value Ref Range Status   SARS Coronavirus 2 by RT PCR NEGATIVE NEGATIVE Final    Comment: (NOTE) SARS-CoV-2 target nucleic acids are NOT DETECTED.  The SARS-CoV-2 RNA is generally detectable in upper and lower respiratory specimens during the acute phase of infection. The lowest concentration of SARS-CoV-2 viral copies this assay can detect is 250 copies / mL. A negative result does not preclude SARS-CoV-2 infection and should not be used as the sole basis for treatment or other patient management decisions.  A negative result may occur with improper specimen collection / handling, submission of specimen other than nasopharyngeal swab, presence of viral mutation(s) within the areas targeted by this assay, and inadequate number of viral copies (<250 copies / mL). A negative result must be combined with clinical observations, patient history, and epidemiological information.  Fact Sheet for Patients:   RoadLapTop.co.za  Fact Sheet for Healthcare  Providers: http://kim-miller.com/  This test is not yet approved or  cleared by the [place] FDA and has been authorized for detection and/or diagnosis of SARS-CoV-2 by FDA under an Emergency Use Authorization (EUA).  This EUA will remain in effect (meaning this test can be used) for the duration of the COVID-19 declaration under Section 564(b)(1) of the Act, 21 U.S.C. section 360bbb-3(b)(1), unless the authorization is terminated or revoked sooner.  Performed at [facility], [address] [place], [place] [address]       Radiology Studies last 3 days: No results found.     Sunnie Nielsen, DO [facility] 01/15/2023,  3:15 PM    Dictation software may have been used to generate the above note. Typos may occur and escape review in typed/dictated notes. Please contact Dr Lyn Hollingshead directly for clarity if needed.  Staff may message me via secure chat in Epic  but this may not receive an immediate response,  please page me for urgent matters!  If 7PM-7AM, please contact night coverage [email or web address]

## 2023-01-16 DIAGNOSIS — R55 Syncope and collapse: Secondary | ICD-10-CM | POA: Diagnosis not present

## 2023-01-16 LAB — GLUCOSE, CAPILLARY
Glucose-Capillary: 121 mg/dL — ABNORMAL HIGH (ref 70–99)
Glucose-Capillary: 171 mg/dL — ABNORMAL HIGH (ref 70–99)
Glucose-Capillary: 141 mg/dL — ABNORMAL HIGH (ref 70–99)
Glucose-Capillary: 145 mg/dL — ABNORMAL HIGH (ref 70–99)

## 2023-01-16 LAB — CBC
HCT: 34.1 % — ABNORMAL LOW (ref 36.0–46.0)
Hemoglobin: 11.3 g/dL — ABNORMAL LOW (ref 12.0–15.0)
MCH: 32.8 pg (ref 26.0–34.0)
MCHC: 33.1 g/dL (ref 30.0–36.0)
MCV: 99.1 fL (ref 80.0–100.0)
Platelets: 166 10*3/uL (ref 150–400)
RBC: 3.44 MIL/uL — ABNORMAL LOW (ref 3.87–5.11)
RDW: 12.8 % (ref 11.5–15.5)
WBC: 5.3 10*3/uL (ref 4.0–10.5)
nRBC: 0 % (ref 0.0–0.2)

## 2023-01-16 LAB — BASIC METABOLIC PANEL
Anion gap: 8 (ref 5–15)
BUN: 16 mg/dL (ref 8–23)
CO2: 25 mmol/L (ref 22–32)
Calcium: 8.4 mg/dL — ABNORMAL LOW (ref 8.9–10.3)
Chloride: 105 mmol/L (ref 98–111)
Creatinine, Ser: 0.73 mg/dL (ref 0.44–1.00)
GFR, Estimated: >60 mL/min (ref >60)
Glucose, Bld: 159 mg/dL — ABNORMAL HIGH (ref 70–99)
Potassium: 3.5 mmol/L (ref 3.5–5.1)
Sodium: 138 mmol/L (ref 135–145)

## 2023-01-16 MED ORDER — ONDANSETRON 8 MG PO TBDP
8.0000 mg | ORAL_TABLET | Freq: Three times a day (TID) | ORAL | Status: DC | PRN
  Administered 2023-01-16: 8 mg via ORAL
  Filled 2023-01-16 (×2): qty 1

## 2023-01-16 NOTE — Progress Notes (Signed)
PROGRESS NOTE    Candace [name]   ION:[id number] DOB: Feb 20, 1951  DOA: 01/10/2023 Date of Service: 01/16/23 which is hospital day 5  PCP: Candace Clos, MD    HPI: [name] is a 72 year old female with history of hyperlipidemia, insulin-dependent diabetes mellitus, hypertension, GERD, depression, anxiety, on anticoagulation, CAD status post CABG LIMA to LAD, SVG to PDA and OM, DES to SVG in 2021, and balloon angioplasty at outside hospital in 2023, history of CVA, paroxysmal atrial fibrillation, on Eliquis, who presents emergency department for chief concerns of chest pain and increased falling.   Hospital course / significant events:  10/14: admitted to hospitalist service, syncope d/t orthostatic hypotension, question arrhythmia. Troponin trended negative, CP resolved. MRI brain no concerns. Imaging T/L-spine and sacrum/coccyx no concerns.  10/15: PT/OT eval, recs for HH. Cardiology recs for 14-day cardiac monitoring, meds as below  10/16: significant orthostatic drop BP standing w/ dizziness symptoms. PT/OT update recs for SNF. Lowered dose isosorbide  10/17: pt feeling better today, awaiting SNF placement  10/18: was ready for d/c until significant dizziness and CP w/ ambulation - held d/c 10/19: suspect parkinsons, will start low dose sinemet and will d/c propranolol and trial nebivolol to hopefully reduce side effects  10/20: await SNF placement tomorrow   Consultants:  Cardiology   Procedures/Surgeries: none      ASSESSMENT & PLAN:   Syncope secondary to orthostatic hypotension, multiple risk factors  W/u possible arrhythmia Zio patch have been placed for monitoring and for outpatient follow-up PT OT work with patient and patient's blood pressure was positive for orthostatic drop Reduced isosorbide but the resumed higher dose given increase chest pain     Coronary artery disease S/P CABG x 4 Chest pain - intermittent exertional angina  High sensitive  troponin were negative x 2. Echo done on 07/22/2022: Showed EF estimated ejection fraction 65 to 70%.  Grade 1 diastolic dysfunction. Continue cardiac monitoring Home Ranexa 1000 mg p.o. twice daily  D/c propranolol 60 mg daily, was on this for cardiac but also tremor, hasn't helped tremor, started bisoprolol hopefully this will minimize adverse effects w/ orthostatic symptoms  Continue atorvastatin 80 mg daily, ezetimibe 10 mg daily, aspirin 81 mg daily, Eliquis 5 mg p.o. twice daily Reduced isosorbide given orthostatic hypotension but then increased d/t chest pain   Type 2 diabetes mellitus with diabetic polyneuropathy, with long-term current use of insulin (HCC) Patient takes Jardiance 10 mg daily, Trulicity 1.5 mg subcutaneous injection weekly, Lantus 100 units subcutaneous nightly Continue insulin SSI with at bedtime coverage ordered Goal inpatient blood glucose levels 140-180   Paroxysmal atrial fibrillation (HCC) Continue Eliquis  Back pain Query secondary to fall CT scan of the lumbar and thoracic spine all within normal limit with no fractures Continue as needed pain medication    Mild obstructive sleep apnea Continue CPAP nightly    Parkinson syndrome Can also contribute to unsteady gait / fall risk Continue outpatient follow-up Have started low dose sinemet here    Hyperlipidemia Continue atorvastatin 80 mg daily   Gastroesophageal reflux disease Protonix 20 mg p.o. twice daily resumed    obesity based on BMI: Body mass index is 30.18 kg/m.   DVT prophylaxis: eliquis IV fluids: no continuous IV fluids  Nutrition: cardiac diet  Central lines / invasive devices: none  Code Status: FULL CODE ACP documentation reviewed: DNR order is on file in [facility] was voided after confirming FULL CODE w/ patient   TOC needs: SNF  placement  Barriers to dispo / significant pending items: placement, monitro BP on lower isosorbide              Subjective / Brief ROS:   Patient reports feeling okay today hasn't been up walking yet  Denies CP/SOB.  Pain controlled.  Denies new weakness.  Tolerating diet.  Reports no concerns w/ urination/defecation.   Family Communication: none at this time     Objective Findings:  Vitals:   01/16/23 0000 01/16/23 0400 01/16/23 0743 01/16/23 1150  BP: 122/75 (!) 140/73 (!) 152/84 136/67  Pulse: 80 80 76 78  Resp: 18 17 16 16   Temp: 98.4 F (36.9 C) 98 F (36.7 C) 97.6 F (36.4 C)   TempSrc: Oral Oral    SpO2: 100% 100% 99% 99%  Weight:      Height:        Intake/Output Summary (Last 24 hours) at 01/16/2023 1458 Last data filed at 01/16/2023 0500 Gross per 24 hour  Intake 240 ml  Output 300 ml  Net -60 ml   Filed Weights   01/10/23 0910  Weight: 74.8 kg    Examination:  Physical Exam Constitutional:      General: She is not in acute distress. Cardiovascular:     Rate and Rhythm: Normal rate and regular rhythm.  Pulmonary:     Effort: Pulmonary effort is normal.     Breath sounds: Normal breath sounds.  Musculoskeletal:     Right lower leg: No edema.     Left lower leg: No edema.  Neurological:     Mental Status: She is alert.     Motor: Tremor present.          Scheduled Medications:    stroke: early stages of recovery book   Does not apply Once   apixaban  5 mg Oral BID   aspirin EC  81 mg Oral Daily   atorvastatin  80 mg Oral Daily   carbidopa-levodopa  1 tablet Oral TID   ezetimibe  10 mg Oral QHS   insulin aspart  0-15 Units Subcutaneous TID WC   insulin aspart  0-5 Units Subcutaneous QHS   insulin glargine-yfgn  12 Units Subcutaneous QHS   isosorbide mononitrate  120 mg Oral Daily   nebivolol  2.5 mg Oral Daily   pantoprazole  20 mg Oral BID   ranolazine  1,000 mg Oral BID   sertraline  100 mg Oral QHS    Continuous Infusions:   PRN Medications:  acetaminophen **OR** acetaminophen (TYLENOL) oral liquid 160 mg/5 mL **OR** acetaminophen, albuterol, ALPRAZolam,  nitroGLYCERIN, senna-docusate  Antimicrobials from admission:  Anti-infectives (From admission, onward)    None           Data Reviewed:  I have personally reviewed the following...  CBC: Recent Labs  Lab 01/10/23 0933 01/11/23 0417 01/12/23 0243 01/16/23 0300  WBC 8.3 6.5 5.3 5.3  NEUTROABS  --   --  1.9  --   HGB 12.5 11.4* 11.3* 11.3*  HCT 38.6 35.1* 34.6* 34.1*  MCV 102.7* 102.3* 101.5* 99.1  PLT 182 172 167 166   Basic Metabolic Panel: Recent Labs  Lab 01/10/23 0933 01/11/23 0417 01/12/23 0243 01/16/23 0300  NA 143 139 141 138  K 4.2 3.5 3.3* 3.5  CL 108 106 109 105  CO2 27 25 25 25   GLUCOSE 175* 94 122* 159*  BUN 17 15 12 16   CREATININE 0.86 0.95 0.83 0.73  CALCIUM 8.7* 8.4* 8.4* 8.4*  GFR: Estimated Creatinine Clearance: 61.1 mL/min (by C-G formula based on SCr of 0.73 mg/dL). Liver Function Tests: Recent Labs  Lab 01/10/23 0933  AST 17  ALT 15  ALKPHOS 130*  BILITOT 0.6  PROT 7.1  ALBUMIN 3.9   No results for input(s): "LIPASE", "AMYLASE" in the last 168 hours. No results for input(s): "AMMONIA" in the last 168 hours. Coagulation Profile: No results for input(s): "INR", "PROTIME" in the last 168 hours. Cardiac Enzymes: Recent Labs  Lab 01/10/23 0933  CKTOTAL 55   BNP (last 3 results) No results for input(s): "PROBNP" in the last 8760 hours. HbA1C: No results for input(s): "HGBA1C" in the last 72 hours. CBG: Recent Labs  Lab 01/15/23 1155 01/15/23 1650 01/15/23 2140 01/16/23 0748 01/16/23 1153  GLUCAP 209* 201* 170* 121* 171*   Lipid Profile: No results for input(s): "CHOL", "HDL", "LDLCALC", "TRIG", "CHOLHDL", "LDLDIRECT" in the last 72 hours. Thyroid Function Tests: No results for input(s): "TSH", "T4TOTAL", "FREET4", "T3FREE", "THYROIDAB" in the last 72 hours. Anemia Panel: No results for input(s): "VITAMINB12", "FOLATE", "FERRITIN", "TIBC", "IRON", "RETICCTPCT" in the last 72 hours. Most Recent Urinalysis On File:      Component Value Date/Time   COLORURINE YELLOW (A) 01/10/2023 1620   APPEARANCEUR CLEAR (A) 01/10/2023 1620   APPEARANCEUR Clear 12/24/2014 0905   LABSPEC 1.027 01/10/2023 1620   LABSPEC 1.030 09/14/2013 2123   PHURINE 7.0 01/10/2023 1620   GLUCOSEU >=500 (A) 01/10/2023 1620   GLUCOSEU Negative 09/14/2013 2123   HGBUR NEGATIVE 01/10/2023 1620   BILIRUBINUR NEGATIVE 01/10/2023 1620   BILIRUBINUR Negative 02/06/2020 1014   BILIRUBINUR Negative 12/24/2014 0905   BILIRUBINUR Negative 09/14/2013 2123   KETONESUR NEGATIVE 01/10/2023 1620   PROTEINUR NEGATIVE 01/10/2023 1620   UROBILINOGEN 0.2 02/06/2020 1014   NITRITE NEGATIVE 01/10/2023 1620   LEUKOCYTESUR NEGATIVE 01/10/2023 1620   LEUKOCYTESUR 3+ 09/14/2013 2123   Sepsis Labs: @LABRCNTIP (procalcitonin:4,lacticidven:4) Microbiology: Recent Results (from the past 240 hour(s))  SARS Coronavirus 2 by RT PCR (hospital order, performed in [facility] hospital lab) *cepheid single result test* Anterior Nasal Swab     Status: None   Collection Time: 01/10/23 10:57 AM   Specimen: Anterior Nasal Swab  Result Value Ref Range Status   SARS Coronavirus 2 by RT PCR NEGATIVE NEGATIVE Final    Comment: (NOTE) SARS-CoV-2 target nucleic acids are NOT DETECTED.  The SARS-CoV-2 RNA is generally detectable in upper and lower respiratory specimens during the acute phase of infection. The lowest concentration of SARS-CoV-2 viral copies this assay can detect is 250 copies / mL. A negative result does not preclude SARS-CoV-2 infection and should not be used as the sole basis for treatment or other patient management decisions.  A negative result may occur with improper specimen collection / handling, submission of specimen other than nasopharyngeal swab, presence of viral mutation(s) within the areas targeted by this assay, and inadequate number of viral copies (<250 copies / mL). A negative result must be combined with clinical observations,  patient history, and epidemiological information.  Fact Sheet for Patients:   RoadLapTop.co.za  Fact Sheet for Healthcare Providers: http://kim-miller.com/  This test is not yet approved or  cleared by the [place] FDA and has been authorized for detection and/or diagnosis of SARS-CoV-2 by FDA under an Emergency Use Authorization (EUA).  This EUA will remain in effect (meaning this test can be used) for the duration of the COVID-19 declaration under Section 564(b)(1) of the Act, 21 U.S.C. section 360bbb-3(b)(1), unless the authorization is  terminated or revoked sooner.  Performed at [facility], [address] [place], [place] [address]       Radiology Studies last 3 days: No results found.     Sunnie Nielsen, DO [facility] 01/16/2023, 2:58 PM    Dictation software may have been used to generate the above note. Typos may occur and escape review in typed/dictated notes. Please contact Dr Lyn Hollingshead directly for clarity if needed.  Staff may message me via secure chat in Epic  but this may not receive an immediate response,  please page me for urgent matters!  If 7PM-7AM, please contact night coverage [email or web address]

## 2023-01-16 NOTE — Plan of Care (Signed)
Problem: Education: Goal: Understanding of cardiac disease, CV risk reduction, and recovery process will improve Outcome: Progressing Goal: Individualized Educational Video(s) Outcome: Progressing   Problem: Activity: Goal: Ability to tolerate increased activity will improve Outcome: Progressing   Problem: Cardiac: Goal: Ability to achieve and maintain adequate cardiovascular perfusion will improve Outcome: Progressing   Problem: Health Behavior/Discharge Planning: Goal: Ability to safely manage health-related needs after discharge will improve Outcome: Progressing   Problem: Education: Goal: Knowledge of disease or condition will improve Outcome: Progressing Goal: Knowledge of secondary prevention will improve (MUST DOCUMENT ALL) Outcome: Progressing Goal: Knowledge of patient specific risk factors will improve Loraine Leriche N/A or DELETE if not current risk factor) Outcome: Progressing   Problem: Ischemic Stroke/TIA Tissue Perfusion: Goal: Complications of ischemic stroke/TIA will be minimized Outcome: Progressing   Problem: Coping: Goal: Will verbalize positive feelings about self Outcome: Progressing Goal: Will identify appropriate support needs Outcome: Progressing   Problem: Health Behavior/Discharge Planning: Goal: Ability to manage health-related needs will improve Outcome: Progressing Goal: Goals will be collaboratively established with patient/family Outcome: Progressing   Problem: Self-Care: Goal: Ability to participate in self-care as condition permits will improve Outcome: Progressing Goal: Verbalization of feelings and concerns over difficulty with self-care will improve Outcome: Progressing Goal: Ability to communicate needs accurately will improve Outcome: Progressing   Problem: Nutrition: Goal: Risk of aspiration will decrease Outcome: Progressing Goal: Dietary intake will improve Outcome: Progressing   Problem: Education: Goal: Ability to describe  self-care measures that may prevent or decrease complications (Diabetes Survival Skills Education) will improve Outcome: Progressing Goal: Individualized Educational Video(s) Outcome: Progressing   Problem: Coping: Goal: Ability to adjust to condition or change in health will improve Outcome: Progressing   Problem: Fluid Volume: Goal: Ability to maintain a balanced intake and output will improve Outcome: Progressing   Problem: Health Behavior/Discharge Planning: Goal: Ability to identify and utilize available resources and services will improve Outcome: Progressing Goal: Ability to manage health-related needs will improve Outcome: Progressing   Problem: Metabolic: Goal: Ability to maintain appropriate glucose levels will improve Outcome: Progressing   Problem: Nutritional: Goal: Maintenance of adequate nutrition will improve Outcome: Progressing Goal: Progress toward achieving an optimal weight will improve Outcome: Progressing   Problem: Skin Integrity: Goal: Risk for impaired skin integrity will decrease Outcome: Progressing   Problem: Tissue Perfusion: Goal: Adequacy of tissue perfusion will improve Outcome: Progressing   Problem: Education: Goal: Knowledge of General Education information will improve Description: Including pain rating scale, medication(s)/side effects and non-pharmacologic comfort measures Outcome: Progressing   Problem: Health Behavior/Discharge Planning: Goal: Ability to manage health-related needs will improve Outcome: Progressing   Problem: Clinical Measurements: Goal: Ability to maintain clinical measurements within normal limits will improve Outcome: Progressing Goal: Will remain free from infection Outcome: Progressing Goal: Diagnostic test results will improve Outcome: Progressing Goal: Respiratory complications will improve Outcome: Progressing Goal: Cardiovascular complication will be avoided Outcome: Progressing   Problem:  Activity: Goal: Risk for activity intolerance will decrease Outcome: Progressing   Problem: Nutrition: Goal: Adequate nutrition will be maintained Outcome: Progressing   Problem: Coping: Goal: Level of anxiety will decrease Outcome: Progressing   Problem: Elimination: Goal: Will not experience complications related to bowel motility Outcome: Progressing Goal: Will not experience complications related to urinary retention Outcome: Progressing   Problem: Pain Managment: Goal: General experience of comfort will improve Outcome: Progressing   Problem: Safety: Goal: Ability to remain free from injury will improve Outcome: Progressing   Problem: Skin Integrity: Goal: Risk for impaired  skin integrity will decrease Outcome: Progressing

## 2023-01-17 DIAGNOSIS — R55 Syncope and collapse: Secondary | ICD-10-CM | POA: Diagnosis not present

## 2023-01-17 LAB — GLUCOSE, CAPILLARY
Glucose-Capillary: 125 mg/dL — ABNORMAL HIGH (ref 70–99)
Glucose-Capillary: 131 mg/dL — ABNORMAL HIGH (ref 70–99)

## 2023-01-17 MED ORDER — ORAL CARE MOUTH RINSE: 15.0000 mL | OROMUCOSAL | Status: DC | PRN

## 2023-01-17 MED ORDER — NEBIVOLOL HCL 2.5 MG PO TABS: 2.5000 mg | ORAL_TABLET | Freq: Every day | ORAL | Status: DC

## 2023-01-17 MED ORDER — CARBIDOPA-LEVODOPA 10-100 MG PO TABS: 1.0000 | ORAL_TABLET | Freq: Three times a day (TID) | ORAL | Status: AC

## 2023-01-17 NOTE — Plan of Care (Signed)
Problem: Education: Goal: Understanding of cardiac disease, CV risk reduction, and recovery process will improve Outcome: Progressing Goal: Individualized Educational Video(s) Outcome: Progressing   Problem: Activity: Goal: Ability to tolerate increased activity will improve Outcome: Progressing   Problem: Cardiac: Goal: Ability to achieve and maintain adequate cardiovascular perfusion will improve Outcome: Progressing   Problem: Health Behavior/Discharge Planning: Goal: Ability to safely manage health-related needs after discharge will improve Outcome: Progressing   Problem: Education: Goal: Knowledge of disease or condition will improve Outcome: Progressing Goal: Knowledge of secondary prevention will improve (MUST DOCUMENT ALL) Outcome: Progressing Goal: Knowledge of patient specific risk factors will improve Loraine Leriche N/A or DELETE if not current risk factor) Outcome: Progressing   Problem: Ischemic Stroke/TIA Tissue Perfusion: Goal: Complications of ischemic stroke/TIA will be minimized Outcome: Progressing   Problem: Coping: Goal: Will verbalize positive feelings about self Outcome: Progressing Goal: Will identify appropriate support needs Outcome: Progressing   Problem: Health Behavior/Discharge Planning: Goal: Ability to manage health-related needs will improve Outcome: Progressing Goal: Goals will be collaboratively established with patient/family Outcome: Progressing   Problem: Self-Care: Goal: Ability to participate in self-care as condition permits will improve Outcome: Progressing Goal: Verbalization of feelings and concerns over difficulty with self-care will improve Outcome: Progressing Goal: Ability to communicate needs accurately will improve Outcome: Progressing   Problem: Nutrition: Goal: Risk of aspiration will decrease Outcome: Progressing Goal: Dietary intake will improve Outcome: Progressing   Problem: Education: Goal: Ability to describe  self-care measures that may prevent or decrease complications (Diabetes Survival Skills Education) will improve Outcome: Progressing Goal: Individualized Educational Video(s) Outcome: Progressing   Problem: Coping: Goal: Ability to adjust to condition or change in health will improve Outcome: Progressing   Problem: Fluid Volume: Goal: Ability to maintain a balanced intake and output will improve Outcome: Progressing   Problem: Health Behavior/Discharge Planning: Goal: Ability to identify and utilize available resources and services will improve Outcome: Progressing Goal: Ability to manage health-related needs will improve Outcome: Progressing   Problem: Metabolic: Goal: Ability to maintain appropriate glucose levels will improve Outcome: Progressing   Problem: Nutritional: Goal: Maintenance of adequate nutrition will improve Outcome: Progressing Goal: Progress toward achieving an optimal weight will improve Outcome: Progressing   Problem: Skin Integrity: Goal: Risk for impaired skin integrity will decrease Outcome: Progressing   Problem: Tissue Perfusion: Goal: Adequacy of tissue perfusion will improve Outcome: Progressing   Problem: Education: Goal: Knowledge of General Education information will improve Description: Including pain rating scale, medication(s)/side effects and non-pharmacologic comfort measures Outcome: Progressing   Problem: Health Behavior/Discharge Planning: Goal: Ability to manage health-related needs will improve Outcome: Progressing   Problem: Clinical Measurements: Goal: Ability to maintain clinical measurements within normal limits will improve Outcome: Progressing Goal: Will remain free from infection Outcome: Progressing Goal: Diagnostic test results will improve Outcome: Progressing Goal: Respiratory complications will improve Outcome: Progressing Goal: Cardiovascular complication will be avoided Outcome: Progressing   Problem:  Activity: Goal: Risk for activity intolerance will decrease Outcome: Progressing   Problem: Nutrition: Goal: Adequate nutrition will be maintained Outcome: Progressing   Problem: Coping: Goal: Level of anxiety will decrease Outcome: Progressing   Problem: Elimination: Goal: Will not experience complications related to bowel motility Outcome: Progressing Goal: Will not experience complications related to urinary retention Outcome: Progressing   Problem: Pain Managment: Goal: General experience of comfort will improve Outcome: Progressing   Problem: Safety: Goal: Ability to remain free from injury will improve Outcome: Progressing   Problem: Skin Integrity: Goal: Risk for impaired  skin integrity will decrease Outcome: Progressing

## 2023-01-17 NOTE — Plan of Care (Signed)
Problem: Education: Goal: Understanding of cardiac disease, CV risk reduction, and recovery process will improve Outcome: Adequate for Discharge Goal: Individualized Educational Video(s) Outcome: Adequate for Discharge   Problem: Activity: Goal: Ability to tolerate increased activity will improve Outcome: Adequate for Discharge   Problem: Cardiac: Goal: Ability to achieve and maintain adequate cardiovascular perfusion will improve Outcome: Adequate for Discharge   Problem: Health Behavior/Discharge Planning: Goal: Ability to safely manage health-related needs after discharge will improve Outcome: Adequate for Discharge   Problem: Education: Goal: Knowledge of disease or condition will improve Outcome: Adequate for Discharge Goal: Knowledge of secondary prevention will improve (MUST DOCUMENT ALL) Outcome: Adequate for Discharge Goal: Knowledge of patient specific risk factors will improve Loraine Leriche N/A or DELETE if not current risk factor) Outcome: Adequate for Discharge   Problem: Ischemic Stroke/TIA Tissue Perfusion: Goal: Complications of ischemic stroke/TIA will be minimized Outcome: Adequate for Discharge   Problem: Coping: Goal: Will verbalize positive feelings about self Outcome: Adequate for Discharge Goal: Will identify appropriate support needs Outcome: Adequate for Discharge   Problem: Health Behavior/Discharge Planning: Goal: Ability to manage health-related needs will improve Outcome: Adequate for Discharge Goal: Goals will be collaboratively established with patient/family Outcome: Adequate for Discharge   Problem: Self-Care: Goal: Ability to participate in self-care as condition permits will improve Outcome: Adequate for Discharge Goal: Verbalization of feelings and concerns over difficulty with self-care will improve Outcome: Adequate for Discharge Goal: Ability to communicate needs accurately will improve Outcome: Adequate for Discharge   Problem:  Nutrition: Goal: Risk of aspiration will decrease Outcome: Adequate for Discharge Goal: Dietary intake will improve Outcome: Adequate for Discharge   Problem: Education: Goal: Ability to describe self-care measures that may prevent or decrease complications (Diabetes Survival Skills Education) will improve Outcome: Adequate for Discharge Goal: Individualized Educational Video(s) Outcome: Adequate for Discharge   Problem: Coping: Goal: Ability to adjust to condition or change in health will improve Outcome: Adequate for Discharge   Problem: Fluid Volume: Goal: Ability to maintain a balanced intake and output will improve Outcome: Adequate for Discharge   Problem: Health Behavior/Discharge Planning: Goal: Ability to identify and utilize available resources and services will improve Outcome: Adequate for Discharge Goal: Ability to manage health-related needs will improve Outcome: Adequate for Discharge   Problem: Metabolic: Goal: Ability to maintain appropriate glucose levels will improve Outcome: Adequate for Discharge   Problem: Nutritional: Goal: Maintenance of adequate nutrition will improve Outcome: Adequate for Discharge Goal: Progress toward achieving an optimal weight will improve Outcome: Adequate for Discharge   Problem: Skin Integrity: Goal: Risk for impaired skin integrity will decrease Outcome: Adequate for Discharge   Problem: Tissue Perfusion: Goal: Adequacy of tissue perfusion will improve Outcome: Adequate for Discharge   Problem: Education: Goal: Knowledge of General Education information will improve Description: Including pain rating scale, medication(s)/side effects and non-pharmacologic comfort measures Outcome: Adequate for Discharge   Problem: Health Behavior/Discharge Planning: Goal: Ability to manage health-related needs will improve Outcome: Adequate for Discharge   Problem: Clinical Measurements: Goal: Ability to maintain clinical  measurements within normal limits will improve Outcome: Adequate for Discharge Goal: Will remain free from infection Outcome: Adequate for Discharge Goal: Diagnostic test results will improve Outcome: Adequate for Discharge Goal: Respiratory complications will improve Outcome: Adequate for Discharge Goal: Cardiovascular complication will be avoided Outcome: Adequate for Discharge   Problem: Activity: Goal: Risk for activity intolerance will decrease Outcome: Adequate for Discharge   Problem: Nutrition: Goal: Adequate nutrition will be maintained Outcome: Adequate for Discharge  Problem: Coping: Goal: Level of anxiety will decrease Outcome: Adequate for Discharge   Problem: Elimination: Goal: Will not experience complications related to bowel motility Outcome: Adequate for Discharge Goal: Will not experience complications related to urinary retention Outcome: Adequate for Discharge   Problem: Pain Managment: Goal: General experience of comfort will improve Outcome: Adequate for Discharge   Problem: Safety: Goal: Ability to remain free from injury will improve Outcome: Adequate for Discharge   Problem: Skin Integrity: Goal: Risk for impaired skin integrity will decrease Outcome: Adequate for Discharge

## 2023-01-17 NOTE — TOC Transition Note (Signed)
Transition of Care [facility]) - CM/SW Discharge Note   Patient Details  Name: Candace [name] MRN: [id number] Date of Birth: 03/19/1951  [facility]) CM/SW Contact:  Truddie Hidden, RN Phone Number: 01/17/2023, 12:58 PM   Clinical Narrative:    Sherron Monday with Raoul Pitch in admissions at [facility]  Per facility patient admission confirmed for today. Patient assigned room # 702 Nurse will call report to [phone] Face sheet and medical necessity forms printed to the floor to be added to the EMS pack EMS arranged "She's fourth."  Discharge summary and SNF transfer report sent in HUB.  Nurse, and family notified spoke with Maralyn Sago, her daughter.  TOC signing off.    Final next level of care: Skilled Nursing Facility Barriers to Discharge: Barriers Resolved   Patient Goals and CMS Choice      Discharge Placement                Patient chooses bed at: [facility] Patient to be transferred to facility by: [facility] Name of family member notified: [name] Patient and family notified of of transfer: 01/17/23  Discharge Plan and Services Additional resources added to the After Visit Summary for                                       Social Determinants of Health (SDOH) Interventions SDOH Screenings   Food Insecurity: No Food Insecurity (01/12/2023)  Housing: Low Risk  (01/12/2023)  Transportation Needs: No Transportation Needs (01/12/2023)  Utilities: Not At Risk (01/12/2023)  Alcohol Screen: Low Risk  (08/10/2021)  Depression (PHQ2-9): Low Risk  (03/26/2022)  Financial Resource Strain: Low Risk  (03/26/2022)  Physical Activity: Insufficiently Active (08/10/2021)  Social Connections: Moderately Integrated (03/26/2022)  Stress: No Stress Concern Present (03/26/2022)  Tobacco Use: Medium Risk (01/10/2023)     Readmission Risk Interventions     No data to display

## 2023-01-17 NOTE — TOC Transition Note (Addendum)
Transition of Care [facility] - CM/SW Discharge Note   Patient Details  Name: Candace [name] MRN: [id number] Date of Birth: [birth year]  Transition of Care [facility]) CM/SW Contact:  Truddie Hidden, RN Phone Number: 01/17/2023, 9:58 AM   Clinical Narrative:    Spoke with [place] in admissions at [facility] to confirm discharge for today. Left VM requesting confirmation back.   Per [place] in admission patient can admit today.     Final next level of care: Skilled Nursing Facility Barriers to Discharge: Barriers Resolved   Patient Goals and CMS Choice      Discharge Placement                Patient chooses bed at: [facility] Patient to be transferred to facility by: [facility] Name of family member notified: [name] Patient and family notified of of transfer: 01/14/23  Discharge Plan and Services Additional resources added to the After Visit Summary for                                       Social Determinants of Health (SDOH) Interventions SDOH Screenings   Food Insecurity: No Food Insecurity (01/12/2023)  Housing: Low Risk  (01/12/2023)  Transportation Needs: No Transportation Needs (01/12/2023)  Utilities: Not At Risk (01/12/2023)  Alcohol Screen: Low Risk  (08/10/2021)  Depression (PHQ2-9): Low Risk  (03/26/2022)  Financial Resource Strain: Low Risk  (03/26/2022)  Physical Activity: Insufficiently Active (08/10/2021)  Social Connections: Moderately Integrated (03/26/2022)  Stress: No Stress Concern Present (03/26/2022)  Tobacco Use: Medium Risk (01/10/2023)     Readmission Risk Interventions     No data to display

## 2023-01-17 NOTE — Progress Notes (Signed)
Physical Therapy Treatment Patient Details Name: Candace [name] MRN: [id number] DOB: [birth year] Today's Date: 01/17/2023   History of Present Illness 72 y/o female presented to ED on 01/10/23 for chest pain and fall. Admitted for possible syncope. PMH: HTN, T2DM, anxiety, depression, CAD s/p CABG, hx of CVA, Afib on Eliquis.    PT Comments  Patient with improved mood this date with potential discharge to SNF this date. In recliner on arrival. Able to complete sit to stand with CGA and ambulated total 30' with RW and CGA. Symptom (dizziness, headache, and chest pressure) onset ~10' but patient able to continue ambulating without need to sit. Improved symptoms since previous session on 10/18. Patient motivated to improve and get back to her independence. Discharge plan remains appropriate.    If plan is discharge home, recommend the following: A little help with walking and/or transfers;A little help with bathing/dressing/bathroom;Assistance with cooking/housework;Assist for transportation;Help with stairs or ramp for entrance   Can travel by private vehicle     No  Equipment Recommendations  Rolling Kylyn Sookram (2 wheels)    Recommendations for Other Services       Precautions / Restrictions Precautions Precautions: Fall Restrictions Weight Bearing Restrictions: No     Mobility  Bed Mobility               General bed mobility comments: in recliner on arrival    Transfers Overall transfer level: Needs assistance Equipment used: Rolling Elanna Bert (2 wheels) Transfers: Sit to/from Stand Sit to Stand: Contact guard assist                Ambulation/Gait Ambulation/Gait assistance: Contact guard assist Gait Distance (Feet): 30 Feet Assistive device: Rolling Dio Giller (2 wheels) Gait Pattern/deviations: Step-to pattern, Decreased stride length Gait velocity: decreased     General Gait Details: patient able to ambulate >10' prior to symptom onset (dizziness, chest pressure,  and mild headache). Able to complete additional 20' prior to needing to sit.   Stairs             Wheelchair Mobility     Tilt Bed    Modified Rankin (Stroke Patients Only)       Balance Overall balance assessment: Needs assistance Sitting-balance support: Feet supported, No upper extremity supported Sitting balance-[name] Scale: Good     Standing balance support: Bilateral upper extremity supported, Reliant on assistive device for balance Standing balance-[name] Scale: Fair                              Cognition Arousal: Alert Behavior During Therapy: WFL for tasks assessed/performed Overall Cognitive Status: Within Functional Limits for tasks assessed                                          Exercises      General Comments        Pertinent Vitals/Pain Pain Assessment Pain Assessment: No/denies pain    Home Living                          Prior Function            PT Goals (current goals can now be found in the care plan section) Acute Rehab PT Goals PT Goal Formulation: With patient Time For Goal Achievement: 01/25/23 Potential to Achieve Goals: Good  Progress towards PT goals: Progressing toward goals    Frequency    Min 1X/week      PT Plan      Co-evaluation              AM-PAC PT "6 Clicks" Mobility   Outcome Measure  Help needed turning from your back to your side while in a flat bed without using bedrails?: A Little Help needed moving from lying on your back to sitting on the side of a flat bed without using bedrails?: A Little Help needed moving to and from a bed to a chair (including a wheelchair)?: A Little Help needed standing up from a chair using your arms (e.g., wheelchair or bedside chair)?: A Little Help needed to walk in hospital room?: A Little Help needed climbing 3-5 steps with a railing? : A Lot 6 Click Score: 17    End of Session   Activity Tolerance: Patient  tolerated treatment well Patient left: in chair;with call bell/phone within reach Nurse Communication: Mobility status PT Visit Diagnosis: Unsteadiness on feet (R26.81);Muscle weakness (generalized) (M62.81);History of falling (Z91.81)     Time: 7829-5621 PT Time Calculation (min) (ACUTE ONLY): 28 min  Charges:    $Therapeutic Activity: 23-37 mins PT General Charges $$ ACUTE PT VISIT: 1 Visit                     [name], PT, DPT Physical Therapist - [facility]  [facility]    Kazim Corrales A Kathern Lobosco 01/17/2023, 2:07 PM

## 2023-01-17 NOTE — Discharge Summary (Signed)
Physician Discharge Summary   Patient: Candace [name] MRN: [id number]  DOB: September 24, 1950   Admit:     Date of Admission: 01/10/2023 Admitted from: home   Discharge: Date of discharge: 01/17/23 Disposition: Skilled nursing facility Condition at discharge: good  CODE STATUS: FULL CODE      Discharge Physician: Sunnie Nielsen, DO [facility]     PCP: Bosie Clos, MD  Recommendations for Outpatient Follow-up:  Follow up with PCP Bosie Clos, MD in 1-2 weeks Please obtain labs/tests: CBC, BMP in 1-2 weeks, monitor BP closely at [facility]. Have stopped propranolol and started nebivolol to hopefully minimize orthostatic effects, continue vs d/c beta blocker per cardiology  Please follow up on the following pending results: none Outpatient follow up w/ neurology - suspect early Parkinson's disease vs Parkinsonian syndrome and have started low dose sinemet - increase as tolerated / per neurology  Outpatient f/u w/ cardiology to review Zio monitor results - see follow up providers in AVS    Discharge Instructions     Diet - low sodium heart healthy   Complete by: As directed    Diet Carb Modified   Complete by: As directed    Discharge instructions   Complete by: As directed    Follow up with neurology and with primary care NO DRIVING   Increase activity slowly   Complete by: As directed    Increase activity slowly   Complete by: As directed          Discharge Diagnoses: Principal Problem:   Syncope Active Problems:   Chest pain   Type 2 diabetes mellitus with diabetic polyneuropathy, with long-term current use of insulin (HCC)   Paroxysmal atrial fibrillation (HCC)   Essential hypertension   Gastroesophageal reflux disease   Mixed hyperlipidemia   Coronary artery disease   S/P CABG x 4   Tremors of nervous system   Mild obstructive sleep apnea   COPD suggested by initial evaluation (HCC)   TIA (transient ischemic attack)   Obesity (BMI  30-39.9)   Back pain       Hospital course / significant events:  [name] is a 72 year old female with history of hyperlipidemia, insulin-dependent diabetes mellitus, hypertension, GERD, depression, anxiety, on anticoagulation, CAD status post CABG LIMA to LAD, SVG to PDA and OM, DES to SVG in 2021, and balloon angioplasty at outside hospital in 2023, history of CVA, paroxysmal atrial fibrillation, on Eliquis, who presents emergency department for chief concerns of chest pain and increased falling.  10/14: admitted to hospitalist service, syncope d/t orthostatic hypotension, question arrhythmia. Troponin trended negative, CP resolved. MRI brain no concerns. Imaging T/L-spine and sacrum/coccyx no concerns.  10/15: PT/OT eval, recs for HH. Cardiology recs for 14-day cardiac monitoring, meds as below  10/16: significant orthostatic drop BP standing w/ dizziness symptoms. PT/OT update recs for SNF. Lowered dose isosorbide  10/17: pt feeling better today, awaiting SNF placement  10/18: was ready for d/c until significant dizziness and CP w/ ambulation - held d/c 10/19: suspect parkinsons, will start low dose sinemet and will d/c propranolol and trial nebivolol to hopefully reduce side effects  10/20: await SNF placement tomorrow  10/21: doing well, stable for discharge.   Consultants:  Cardiology   Procedures/Surgeries: none      ASSESSMENT & PLAN:   Syncope secondary to orthostatic hypotension, multiple risk factors  W/u possible arrhythmia Zio patch have been placed for monitoring and for outpatient follow-up PT OT  work with patient and patient's blood pressure was positive for orthostatic drop Needing higher isosorbide d/t angina Have changed    Coronary artery disease S/P CABG x 4 Chest pain-resolved High sensitive troponin were negative x 2. Echo done on 07/22/2022: Showed EF estimated ejection fraction 65 to 70%.  Grade 1 diastolic dysfunction. Home Ranexa 1000 mg  p.o. twice daily and propranolol 60 mg daily resumed Of note, propranolol Rx d/t tremor but it doesn't seem to have helped her tremor, suspect parkinsonism and d/c this Rx starting bystolic alternative beta blocker, continue vs dc/change depending cardiology outpatient f/u   Continue atorvastatin 80 mg daily, ezetimibe 10 mg daily, aspirin 81 mg daily, Eliquis 5 mg p.o. twice daily Isosorbide resumed home dose    Type 2 diabetes mellitus with diabetic polyneuropathy, with long-term current use of insulin (HCC) Patient takes Jardiance 10 mg daily, Trulicity 1.5 mg subcutaneous injection weekly, Lantus 100 units subcutaneous nightly Continue insulin    Paroxysmal atrial fibrillation (HCC) Continue Eliquis  Back pain secondary to fall CT scan of the lumbar and thoracic spine all within normal limit with no fractures Continue as needed Tylenol prn    Mild obstructive sleep apnea Continue CPAP nightly    Parkinson syndrome Can also contribute to unsteady gait / fall risk Continue outpatient follow-up    Hyperlipidemia Continue atorvastatin 80 mg daily   Gastroesophageal reflux disease Protonix 20 mg p.o. twice daily resumed             Discharge Instructions  Allergies as of 01/17/2023       Reactions   Tramadol Other (See Comments)   Causes patient to be off balance and Mental Changes   Lisinopril Cough   Penicillins Swelling, Rash, Other (See Comments)   Did it involve swelling of the face/tongue/throat, SOB, or low BP? Yes Did it involve sudden or severe rash/hives, skin peeling, or any reaction on the inside of your mouth or nose? Yes Did you need to seek medical attention at a hospital or doctor's office? Yes When did it last happen?      15 years If all above answers are "NO", may proceed with cephalosporin use.        Medication List     STOP taking these medications    loperamide 2 MG capsule Commonly known as: IMODIUM   metoprolol tartrate 50 MG  tablet Commonly known as: LOPRESSOR   propranolol ER 60 MG 24 hr capsule Commonly known as: INDERAL LA       TAKE these medications    acetaminophen 325 MG tablet Commonly known as: TYLENOL Take 650 mg by mouth every 6 (six) hours as needed for moderate pain or headache.   albuterol 108 (90 Base) MCG/ACT inhaler Commonly known as: VENTOLIN HFA Inhale 2 puffs into the lungs every 6 (six) hours as needed for wheezing or shortness of breath.   aspirin EC 81 MG tablet Take 81 mg by mouth daily. Swallow whole.   atorvastatin 80 MG tablet Commonly known as: LIPITOR Take 1 tablet (80 mg total) by mouth daily.   carbidopa-levodopa 10-100 MG tablet Commonly known as: SINEMET IR Take 1 tablet by mouth 3 (three) times daily.   Comfort EZ Pen Needles 32G X 4 MM Misc Generic drug: Insulin Pen Needle Use to inject insulin daily   Eliquis 5 MG Tabs tablet Generic drug: apixaban Take 5 mg by mouth 2 (two) times daily.   ezetimibe 10 MG tablet Commonly known as: ZETIA Take 1  tablet (10 mg total) by mouth daily.   glucose blood test strip Use to check blood sugars daily as instructed   isosorbide mononitrate 120 MG 24 hr tablet Commonly known as: IMDUR Take 1 tablet (120 mg total) by mouth daily. What changed: Another medication with the same name was removed. Continue taking this medication, and follow the directions you see here.   Jardiance 10 MG Tabs tablet Generic drug: empagliflozin Take 25 mg by mouth every morning.   Lantus SoloStar 100 UNIT/ML Solostar Pen Generic drug: insulin glargine Inject 12 Units into the skin at bedtime. What changed: how much to take   nebivolol 2.5 MG tablet Commonly known as: BYSTOLIC Take 1 tablet (2.5 mg total) by mouth daily. Start taking on: January 18, 2023   nitroGLYCERIN 0.4 MG SL tablet Commonly known as: NITROSTAT Place 1 tablet (0.4 mg total) under the tongue every 5 (five) minutes as needed for chest pain.   nystatin  powder Commonly known as: nystatin Apply 1 application topically daily.   ondansetron 8 MG disintegrating tablet Commonly known as: ZOFRAN-ODT TAKE ONE TABLET BY MOUTH EVERY 8 HOURS AS NEEDED FOR NAUSEA AND VOMITING   OneTouch Delica Plus Lancet33G Misc USE UP TO 4 TIMES DAILY AS DIRECTED   OneTouch Verio w/Device Kit Use daily to check blood sugar   pantoprazole 20 MG tablet Commonly known as: Protonix Take 1 tablet (20 mg total) by mouth 2 (two) times daily.   ranolazine 1000 MG SR tablet Commonly known as: RANEXA Take 1 tablet (1,000 mg total) by mouth 2 (two) times daily.   sertraline 100 MG tablet Commonly known as: ZOLOFT Take 1 tablet (100 mg total) by mouth daily. What changed: when to take this   Trulicity 1.5 MG/0.5ML Soaj Generic drug: Dulaglutide Inject 1.5 mg into the skin once a week. Typically takes Weds, ok to administer 01/14/23 has not had it this week, can resume weekly on Fridays after today's dose What changed: additional instructions          Contact information for follow-up providers     Callwood, Gerda Diss D, MD. Go in 1 week(s).   Specialties: Cardiology, Internal Medicine Contact information: [address] [place] [address] [phone]         Bosie Clos, MD. Schedule an appointment as soon as possible for a visit.   Specialty: Family Medicine Why: hospital follow up in 1-2 weeks Contact information: [address] [place] [address] [phone]              Contact information for after-discharge care     Destination     [facility] Preferred SNF .   Service: Skilled Nursing Contact information: [address] [place] [address] [phone]                     Allergies  Allergen Reactions   Tramadol Other (See Comments)    Causes patient to be off balance and Mental Changes   Lisinopril Cough   Penicillins Swelling, Rash  and Other (See Comments)    Did it involve swelling of the face/tongue/throat, SOB, or low BP? Yes Did it involve sudden or severe rash/hives, skin peeling, or any reaction on the inside of your mouth or nose? Yes Did you need to seek medical attention at a hospital or doctor's office? Yes When did it last happen?      15 years If all above answers are "  NO", may proceed with cephalosporin use.      Subjective: pt states feeling better today and good energy today, no concerns for discharge. No CP/SOB, no HA/VC,dizziness, good appetitie, no abd pain no N/V, no edema, no focal weakness    Discharge Exam: BP (!) 143/80 (BP Location: Left Arm)   Pulse 73   Temp 98.4 F (36.9 C) (Oral)   Resp 18   Ht 5\' 2"  (1.575 m)   Wt 74.8 kg   SpO2 96%   BMI 30.18 kg/m  General: Pt is alert, awake, not in acute distress Cardiovascular: RRR, S1/S2 +, no rubs, no gallops Respiratory: CTA bilaterally, no wheezing, no rhonchi Abdominal: Soft, NT, ND, bowel sounds +WNL Extremities: no edema, no cyanosis Neuro: tremor at rest in hand, head tremor, halting speech but clear mentation      The results of significant diagnostics from this hospitalization (including imaging, microbiology, ancillary and laboratory) are listed below for reference.     Microbiology: Recent Results (from the past 240 hour(s))  SARS Coronavirus 2 by RT PCR (hospital order, performed in [facility] hospital lab) *cepheid single result test* Anterior Nasal Swab     Status: None   Collection Time: 01/10/23 10:57 AM   Specimen: Anterior Nasal Swab  Result Value Ref Range Status   SARS Coronavirus 2 by RT PCR NEGATIVE NEGATIVE Final    Comment: (NOTE) SARS-CoV-2 target nucleic acids are NOT DETECTED.  The SARS-CoV-2 RNA is generally detectable in upper and lower respiratory specimens during the acute phase of infection. The lowest concentration of SARS-CoV-2 viral copies this assay can detect is 250 copies / mL. A negative  result does not preclude SARS-CoV-2 infection and should not be used as the sole basis for treatment or other patient management decisions.  A negative result may occur with improper specimen collection / handling, submission of specimen other than nasopharyngeal swab, presence of viral mutation(s) within the areas targeted by this assay, and inadequate number of viral copies (<250 copies / mL). A negative result must be combined with clinical observations, patient history, and epidemiological information.  Fact Sheet for Patients:   RoadLapTop.co.za  Fact Sheet for Healthcare Providers: http://kim-miller.com/  This test is not yet approved or  cleared by the [place] FDA and has been authorized for detection and/or diagnosis of SARS-CoV-2 by FDA under an Emergency Use Authorization (EUA).  This EUA will remain in effect (meaning this test can be used) for the duration of the COVID-19 declaration under Section 564(b)(1) of the Act, 21 U.S.C. section 360bbb-3(b)(1), unless the authorization is terminated or revoked sooner.  Performed at [facility], [address] [place], [place] [address]      Labs: BNP (last 3 results) No results for input(s): "BNP" in the last 8760 hours. Basic Metabolic Panel: Recent Labs  Lab 01/11/23 0417 01/12/23 0243 01/16/23 0300  NA 139 141 138  K 3.5 3.3* 3.5  CL 106 109 105  CO2 25 25 25   GLUCOSE 94 122* 159*  BUN 15 12 16   CREATININE 0.95 0.83 0.73  CALCIUM 8.4* 8.4* 8.4*   Liver Function Tests: No results for input(s): "AST", "ALT", "ALKPHOS", "BILITOT", "PROT", "ALBUMIN" in the last 168 hours.  No results for input(s): "LIPASE", "AMYLASE" in the last 168 hours. No results for input(s): "AMMONIA" in the last 168 hours. CBC: Recent Labs  Lab 01/11/23 0417 01/12/23 0243 01/16/23 0300  WBC 6.5 5.3 5.3  NEUTROABS  --  1.9  --  HGB 11.4* 11.3* 11.3*  HCT 35.1* 34.6*  34.1*  MCV 102.3* 101.5* 99.1  PLT 172 167 166   Cardiac Enzymes: No results for input(s): "CKTOTAL", "CKMB", "CKMBINDEX", "TROPONINI" in the last 168 hours.  BNP: Invalid input(s): "POCBNP" CBG: Recent Labs  Lab 01/16/23 1153 01/16/23 1627 01/16/23 2107 01/17/23 0824 01/17/23 1216  GLUCAP 171* 141* 145* 125* 131*   D-Dimer No results for input(s): "DDIMER" in the last 72 hours. Hgb A1c No results for input(s): "HGBA1C" in the last 72 hours. Lipid Profile No results for input(s): "CHOL", "HDL", "LDLCALC", "TRIG", "CHOLHDL", "LDLDIRECT" in the last 72 hours. Thyroid function studies No results for input(s): "TSH", "T4TOTAL", "T3FREE", "THYROIDAB" in the last 72 hours.  Invalid input(s): "FREET3" Anemia work up No results for input(s): "VITAMINB12", "FOLATE", "FERRITIN", "TIBC", "IRON", "RETICCTPCT" in the last 72 hours. Urinalysis    Component Value Date/Time   COLORURINE YELLOW (A) 01/10/2023 1620   APPEARANCEUR CLEAR (A) 01/10/2023 1620   APPEARANCEUR Clear 12/24/2014 0905   LABSPEC 1.027 01/10/2023 1620   LABSPEC 1.030 09/14/2013 2123   PHURINE 7.0 01/10/2023 1620   GLUCOSEU >=500 (A) 01/10/2023 1620   GLUCOSEU Negative 09/14/2013 2123   HGBUR NEGATIVE 01/10/2023 1620   BILIRUBINUR NEGATIVE 01/10/2023 1620   BILIRUBINUR Negative 02/06/2020 1014   BILIRUBINUR Negative 12/24/2014 0905   BILIRUBINUR Negative 09/14/2013 2123   KETONESUR NEGATIVE 01/10/2023 1620   PROTEINUR NEGATIVE 01/10/2023 1620   UROBILINOGEN 0.2 02/06/2020 1014   NITRITE NEGATIVE 01/10/2023 1620   LEUKOCYTESUR NEGATIVE 01/10/2023 1620   LEUKOCYTESUR 3+ 09/14/2013 2123   Sepsis Labs Recent Labs  Lab 01/11/23 0417 01/12/23 0243 01/16/23 0300  WBC 6.5 5.3 5.3   Microbiology Recent Results (from the past 240 hour(s))  SARS Coronavirus 2 by RT PCR (hospital order, performed in [facility] hospital lab) *cepheid single result test* Anterior Nasal Swab     Status: None   Collection Time:  01/10/23 10:57 AM   Specimen: Anterior Nasal Swab  Result Value Ref Range Status   SARS Coronavirus 2 by RT PCR NEGATIVE NEGATIVE Final    Comment: (NOTE) SARS-CoV-2 target nucleic acids are NOT DETECTED.  The SARS-CoV-2 RNA is generally detectable in upper and lower respiratory specimens during the acute phase of infection. The lowest concentration of SARS-CoV-2 viral copies this assay can detect is 250 copies / mL. A negative result does not preclude SARS-CoV-2 infection and should not be used as the sole basis for treatment or other patient management decisions.  A negative result may occur with improper specimen collection / handling, submission of specimen other than nasopharyngeal swab, presence of viral mutation(s) within the areas targeted by this assay, and inadequate number of viral copies (<250 copies / mL). A negative result must be combined with clinical observations, patient history, and epidemiological information.  Fact Sheet for Patients:   RoadLapTop.co.za  Fact Sheet for Healthcare Providers: http://kim-miller.com/  This test is not yet approved or  cleared by the [place] FDA and has been authorized for detection and/or diagnosis of SARS-CoV-2 by FDA under an Emergency Use Authorization (EUA).  This EUA will remain in effect (meaning this test can be used) for the duration of the COVID-19 declaration under Section 564(b)(1) of the Act, 21 U.S.C. section 360bbb-3(b)(1), unless the authorization is terminated or revoked sooner.  Performed at [facility], [address], [place], [place] [address]    Imaging CT LUMBAR SPINE WO CONTRAST  Result Date: 01/10/2023 CLINICAL DATA:  Low back pain. Tenderness  over T9-T11 and L2-L3 area. EXAM: CT THORACIC AND LUMBAR SPINE WITHOUT CONTRAST TECHNIQUE: Multidetector CT imaging of the thoracic and lumbar spine was performed without contrast. Multiplanar CT image  reconstructions were also generated. RADIATION DOSE REDUCTION: This exam was performed according to the departmental dose-optimization program which includes automated exposure control, adjustment of the mA and/or kV according to patient size and/or use of iterative reconstruction technique. COMPARISON:  CT abdomen pelvis dated 07/01/2022 and chest CT dated 08/11/2021. FINDINGS: Evaluation for fracture is limited due to osteopenia. CT THORACIC SPINE FINDINGS Alignment: No acute subluxation. Vertebrae: No acute fracture.  Osteopenia. Paraspinal and other soft tissues: No perispinal fluid collection. There is coronary vascular calcification. Atherosclerotic calcification of the aorta. Disc levels: No acute findings.  Degenerative changes. CT LUMBAR SPINE FINDINGS Segmentation: 5 lumbar type vertebrae. Alignment: No acute subluxation. Vertebrae: No acute fracture. The bones are osteopenic which limits evaluation for fracture. Paraspinal and other soft tissues: No paraspinal fluid collection. Atherosclerotic calcification of the aorta. Disc levels: No acute findings. Multilevel degenerative changes with disc desiccation and vacuum phenomena. IMPRESSION: 1. No acute/traumatic thoracic or lumbar spine pathology. 2. Osteopenia. 3.  Aortic Atherosclerosis (ICD10-I70.0). Electronically Signed   By: Elgie Collard M.D.   On: 01/10/2023 23:22   CT THORACIC SPINE WO CONTRAST  Result Date: 01/10/2023 CLINICAL DATA:  Low back pain. Tenderness over T9-T11 and L2-L3 area. EXAM: CT THORACIC AND LUMBAR SPINE WITHOUT CONTRAST TECHNIQUE: Multidetector CT imaging of the thoracic and lumbar spine was performed without contrast. Multiplanar CT image reconstructions were also generated. RADIATION DOSE REDUCTION: This exam was performed according to the departmental dose-optimization program which includes automated exposure control, adjustment of the mA and/or kV according to patient size and/or use of iterative reconstruction  technique. COMPARISON:  CT abdomen pelvis dated 07/01/2022 and chest CT dated 08/11/2021. FINDINGS: Evaluation for fracture is limited due to osteopenia. CT THORACIC SPINE FINDINGS Alignment: No acute subluxation. Vertebrae: No acute fracture.  Osteopenia. Paraspinal and other soft tissues: No perispinal fluid collection. There is coronary vascular calcification. Atherosclerotic calcification of the aorta. Disc levels: No acute findings.  Degenerative changes. CT LUMBAR SPINE FINDINGS Segmentation: 5 lumbar type vertebrae. Alignment: No acute subluxation. Vertebrae: No acute fracture. The bones are osteopenic which limits evaluation for fracture. Paraspinal and other soft tissues: No paraspinal fluid collection. Atherosclerotic calcification of the aorta. Disc levels: No acute findings. Multilevel degenerative changes with disc desiccation and vacuum phenomena. IMPRESSION: 1. No acute/traumatic thoracic or lumbar spine pathology. 2. Osteopenia. 3.  Aortic Atherosclerosis (ICD10-I70.0). Electronically Signed   By: Elgie Collard M.D.   On: 01/10/2023 23:22   DG Sacrum/Coccyx  Result Date: 01/10/2023 CLINICAL DATA:  Sacral pain after fall EXAM: SACRUM AND COCCYX - 2+ VIEW COMPARISON:  None Available. FINDINGS: There is no evidence of fracture or other focal bone lesions. IMPRESSION: Negative. Electronically Signed   By: Jasmine Pang M.D.   On: 01/10/2023 22:47   DG Chest Port 1 View  Result Date: 01/10/2023 CLINICAL DATA:  Weakness chest pain EXAM: PORTABLE CHEST 1 VIEW COMPARISON:  06/30/2022 FINDINGS: Post sternotomy changes. No acute airspace disease or effusion. Stable cardiomediastinal silhouette. No pneumothorax IMPRESSION: No active disease. Electronically Signed   By: Jasmine Pang M.D.   On: 01/10/2023 22:46   MR BRAIN WO CONTRAST  Result Date: 01/10/2023 CLINICAL DATA:  Stroke suspected, dizziness and blurred vision, now resolved, profound weakness with fall EXAM: MRI HEAD WITHOUT CONTRAST  TECHNIQUE: Multiplanar, multiecho pulse sequences of the brain and  surrounding structures were obtained without intravenous contrast. COMPARISON:  02/11/2022 MRI head, correlation is also made with 01/10/2023 CT head FINDINGS: Brain: No restricted diffusion to suggest acute or subacute infarct. No acute hemorrhage, mass, mass effect, or midline shift. No hydrocephalus or extra-axial collection. Normal pituitary and craniocervical junction. No hemosiderin deposition to suggest remote hemorrhage. Multiple remote lacunar infarcts in the basal ganglia and right cerebellar lobe. T2 hyperintense signal in the periventricular white matter, likely the sequela of chronic small vessel ischemic disease. Vascular: Normal arterial flow voids. Skull and upper cervical spine: Normal marrow signal. Sinuses/Orbits: Mild mucosal thickening in the ethmoid air cells and inferior left frontal sinus. No acute finding in the orbits. Other: The mastoid air cells are well aerated. IMPRESSION: No acute intracranial process. No evidence of acute or subacute infarct. Electronically Signed   By: Wiliam Ke M.D.   On: 01/10/2023 22:23   CT Head Wo Contrast  Result Date: 01/10/2023 CLINICAL DATA:  72 year old female with chest pain, headache, fall, found down. EXAM: CT HEAD WITHOUT CONTRAST TECHNIQUE: Contiguous axial images were obtained from the base of the skull through the vertex without intravenous contrast. RADIATION DOSE REDUCTION: This exam was performed according to the departmental dose-optimization program which includes automated exposure control, adjustment of the mA and/or kV according to patient size and/or use of iterative reconstruction technique. COMPARISON:  Brain MRI 02/11/2022.  Head CT 01/01/2022. FINDINGS: Brain: Cerebral volume is within normal limits for age. No midline shift, ventriculomegaly, mass effect, evidence of mass lesion, intracranial hemorrhage or evidence of cortically based acute infarction. Chronic  bilateral basal ganglia lacunar infarcts. Chronic encephalomalacia in the body of the corpus callosum. Stable gray-white matter differentiation throughout the brain. Vascular: Calcified atherosclerosis at the skull base. No suspicious intracranial vascular hyperdensity. Skull: Stable, intact. Sinuses/Orbits: Visualized paranasal sinuses and mastoids are stable and well aerated. Other: No orbit or scalp soft tissue injury identified. IMPRESSION: 1. No acute intracranial abnormality or acute traumatic injury identified. 2. Stable non contrast CT appearance of advanced chronic small vessel disease. Electronically Signed   By: Odessa Fleming M.D.   On: 01/10/2023 11:37      Time coordinating discharge: over 30 minutes  SIGNED:  Sunnie Nielsen DO [facility]

## 2023-01-17 NOTE — Care Management Important Message (Signed)
Important Message  Patient Details  Name: Candace [name] MRN: [id number] Date of Birth: 03-Jul-1950   Important Message Given:  Yes - Medicare IM     Truddie Hidden, RN 01/17/2023, 12:52 PM

## 2023-04-28 DIAGNOSIS — R7303 Prediabetes: Secondary | ICD-10-CM | POA: Insufficient documentation

## 2023-07-28 ENCOUNTER — Emergency Department

## 2023-07-28 ENCOUNTER — Encounter: Payer: Self-pay | Admitting: Intensive Care

## 2023-07-28 ENCOUNTER — Other Ambulatory Visit: Payer: Self-pay

## 2023-07-28 ENCOUNTER — Observation Stay
Admission: EM | Admit: 2023-07-28 | Discharge: 2023-07-29 | Disposition: A | Discharge status: Home / Self Care | Attending: Osteopathic Medicine | Admitting: Osteopathic Medicine

## 2023-07-28 DIAGNOSIS — I5032 Chronic diastolic (congestive) heart failure: Secondary | ICD-10-CM | POA: Diagnosis present

## 2023-07-28 DIAGNOSIS — R9431 Abnormal electrocardiogram [ECG] [EKG]: Secondary | ICD-10-CM | POA: Diagnosis not present

## 2023-07-28 DIAGNOSIS — R55 Syncope and collapse: Secondary | ICD-10-CM | POA: Diagnosis not present

## 2023-07-28 DIAGNOSIS — E785 Hyperlipidemia, unspecified: Secondary | ICD-10-CM | POA: Diagnosis present

## 2023-07-28 DIAGNOSIS — Z7901 Long term (current) use of anticoagulants: Secondary | ICD-10-CM | POA: Insufficient documentation

## 2023-07-28 DIAGNOSIS — G4733 Obstructive sleep apnea (adult) (pediatric): Secondary | ICD-10-CM | POA: Diagnosis not present

## 2023-07-28 DIAGNOSIS — Z951 Presence of aortocoronary bypass graft: Secondary | ICD-10-CM | POA: Insufficient documentation

## 2023-07-28 DIAGNOSIS — I2510 Atherosclerotic heart disease of native coronary artery without angina pectoris: Secondary | ICD-10-CM | POA: Diagnosis not present

## 2023-07-28 DIAGNOSIS — R42 Dizziness and giddiness: Secondary | ICD-10-CM | POA: Diagnosis not present

## 2023-07-28 DIAGNOSIS — E1142 Type 2 diabetes mellitus with diabetic polyneuropathy: Secondary | ICD-10-CM

## 2023-07-28 DIAGNOSIS — I951 Orthostatic hypotension: Secondary | ICD-10-CM | POA: Diagnosis not present

## 2023-07-28 DIAGNOSIS — Z7982 Long term (current) use of aspirin: Secondary | ICD-10-CM | POA: Insufficient documentation

## 2023-07-28 DIAGNOSIS — E1140 Type 2 diabetes mellitus with diabetic neuropathy, unspecified: Secondary | ICD-10-CM | POA: Diagnosis not present

## 2023-07-28 DIAGNOSIS — Z8673 Personal history of transient ischemic attack (TIA), and cerebral infarction without residual deficits: Secondary | ICD-10-CM | POA: Diagnosis not present

## 2023-07-28 DIAGNOSIS — F418 Other specified anxiety disorders: Secondary | ICD-10-CM | POA: Diagnosis not present

## 2023-07-28 DIAGNOSIS — E663 Overweight: Secondary | ICD-10-CM | POA: Diagnosis not present

## 2023-07-28 DIAGNOSIS — Z794 Long term (current) use of insulin: Secondary | ICD-10-CM | POA: Diagnosis not present

## 2023-07-28 DIAGNOSIS — Z955 Presence of coronary angioplasty implant and graft: Secondary | ICD-10-CM | POA: Diagnosis not present

## 2023-07-28 DIAGNOSIS — I480 Paroxysmal atrial fibrillation: Secondary | ICD-10-CM | POA: Diagnosis not present

## 2023-07-28 DIAGNOSIS — Z79899 Other long term (current) drug therapy: Secondary | ICD-10-CM | POA: Diagnosis not present

## 2023-07-28 DIAGNOSIS — J449 Chronic obstructive pulmonary disease, unspecified: Secondary | ICD-10-CM | POA: Diagnosis not present

## 2023-07-28 DIAGNOSIS — I6523 Occlusion and stenosis of bilateral carotid arteries: Secondary | ICD-10-CM | POA: Diagnosis present

## 2023-07-28 DIAGNOSIS — Z87891 Personal history of nicotine dependence: Secondary | ICD-10-CM | POA: Diagnosis not present

## 2023-07-28 DIAGNOSIS — I10 Essential (primary) hypertension: Secondary | ICD-10-CM | POA: Diagnosis present

## 2023-07-28 DIAGNOSIS — Z7985 Long-term (current) use of injectable non-insulin antidiabetic drugs: Secondary | ICD-10-CM | POA: Diagnosis not present

## 2023-07-28 DIAGNOSIS — R251 Tremor, unspecified: Secondary | ICD-10-CM | POA: Diagnosis present

## 2023-07-28 DIAGNOSIS — I110 Hypertensive heart disease with heart failure: Secondary | ICD-10-CM | POA: Diagnosis not present

## 2023-07-28 DIAGNOSIS — F4320 Adjustment disorder, unspecified: Secondary | ICD-10-CM

## 2023-07-28 DIAGNOSIS — Z6829 Body mass index (BMI) 29.0-29.9, adult: Secondary | ICD-10-CM | POA: Insufficient documentation

## 2023-07-28 LAB — COMPREHENSIVE METABOLIC PANEL WITH GFR
ALT: <5 U/L (ref 0–44)
AST: 17 U/L (ref 15–41)
Albumin: 3.1 g/dL — ABNORMAL LOW (ref 3.5–5.0)
Alkaline Phosphatase: 85 U/L (ref 38–126)
Anion gap: 3 — ABNORMAL LOW (ref 5–15)
BUN: 12 mg/dL (ref 8–23)
CO2: 24 mmol/L (ref 22–32)
Calcium: 7.7 mg/dL — ABNORMAL LOW (ref 8.9–10.3)
Chloride: 113 mmol/L — ABNORMAL HIGH (ref 98–111)
Creatinine, Ser: 0.77 mg/dL (ref 0.44–1.00)
GFR, Estimated: >60 mL/min (ref >60)
Glucose, Bld: 143 mg/dL — ABNORMAL HIGH (ref 70–99)
Potassium: 3.9 mmol/L (ref 3.5–5.1)
Sodium: 140 mmol/L (ref 135–145)
Total Bilirubin: 1.1 mg/dL (ref 0.0–1.2)
Total Protein: 5.9 g/dL — ABNORMAL LOW (ref 6.5–8.1)

## 2023-07-28 LAB — URINALYSIS, ROUTINE W REFLEX MICROSCOPIC
Bacteria, UA: NONE SEEN
Bilirubin Urine: NEGATIVE
Glucose, UA: >=500 mg/dL — AB
Hgb urine dipstick: NEGATIVE
Ketones, ur: NEGATIVE mg/dL
Nitrite: NEGATIVE
Protein, ur: NEGATIVE mg/dL
Specific Gravity, Urine: 1.03 (ref 1.005–1.030)
pH: 7 (ref 5.0–8.0)

## 2023-07-28 LAB — CBC
HCT: 33 % — ABNORMAL LOW (ref 36.0–46.0)
Hemoglobin: 10.8 g/dL — ABNORMAL LOW (ref 12.0–15.0)
MCH: 33.8 pg (ref 26.0–34.0)
MCHC: 32.7 g/dL (ref 30.0–36.0)
MCV: 103.1 fL — ABNORMAL HIGH (ref 80.0–100.0)
Platelets: 161 10*3/uL (ref 150–400)
RBC: 3.2 MIL/uL — ABNORMAL LOW (ref 3.87–5.11)
RDW: 13.2 % (ref 11.5–15.5)
WBC: 6.2 10*3/uL (ref 4.0–10.5)
nRBC: 0 % (ref 0.0–0.2)

## 2023-07-28 LAB — LIPASE, BLOOD: Lipase: 37 U/L (ref 11–51)

## 2023-07-28 LAB — MAGNESIUM: Magnesium: 1.9 mg/dL (ref 1.7–2.4)

## 2023-07-28 LAB — TROPONIN I (HIGH SENSITIVITY)
Troponin I (High Sensitivity): 5 ng/L (ref <18)
Troponin I (High Sensitivity): 5 ng/L (ref <18)

## 2023-07-28 MED ORDER — SODIUM CHLORIDE 0.9 % IV SOLN: INTRAVENOUS | Status: DC

## 2023-07-28 MED ORDER — INSULIN ASPART 100 UNIT/ML IJ SOLN: 0.0000 [IU] | Freq: Every day | INTRAMUSCULAR | Status: DC

## 2023-07-28 MED ORDER — MECLIZINE HCL 25 MG PO TABS
25.0000 mg | ORAL_TABLET | Freq: Once | ORAL | Status: AC
  Administered 2023-07-28: 25 mg via ORAL
  Filled 2023-07-28: qty 1

## 2023-07-28 MED ORDER — ALBUTEROL SULFATE HFA 108 (90 BASE) MCG/ACT IN AERS: 2.0000 | INHALATION_SPRAY | RESPIRATORY_TRACT | Status: DC | PRN

## 2023-07-28 MED ORDER — ACETAMINOPHEN 325 MG PO TABS
650.0000 mg | ORAL_TABLET | Freq: Once | ORAL | Status: AC
Start: 2023-07-28 — End: 2023-07-28
  Administered 2023-07-28: 650 mg via ORAL
  Filled 2023-07-28: qty 2

## 2023-07-28 MED ORDER — HYDRALAZINE HCL 20 MG/ML IJ SOLN: 5.0000 mg | INTRAMUSCULAR | Status: DC | PRN

## 2023-07-28 MED ORDER — ALBUTEROL SULFATE (2.5 MG/3ML) 0.083% IN NEBU: 2.5000 mg | INHALATION_SOLUTION | RESPIRATORY_TRACT | Status: DC | PRN

## 2023-07-28 MED ORDER — ACETAMINOPHEN 325 MG PO TABS: 650.0000 mg | ORAL_TABLET | Freq: Four times a day (QID) | ORAL | Status: DC | PRN

## 2023-07-28 MED ORDER — ONDANSETRON HCL 4 MG/2ML IJ SOLN: 4.0000 mg | Freq: Three times a day (TID) | INTRAMUSCULAR | Status: DC | PRN

## 2023-07-28 MED ORDER — DIPHENHYDRAMINE HCL 50 MG/ML IJ SOLN: 12.5000 mg | Freq: Three times a day (TID) | INTRAMUSCULAR | Status: DC | PRN

## 2023-07-28 MED ORDER — DM-GUAIFENESIN ER 30-600 MG PO TB12: 1.0000 | ORAL_TABLET | Freq: Two times a day (BID) | ORAL | Status: DC | PRN

## 2023-07-28 MED ORDER — MECLIZINE HCL 25 MG PO TABS: 25.0000 mg | ORAL_TABLET | Freq: Three times a day (TID) | ORAL | Status: DC | PRN

## 2023-07-28 MED ORDER — INSULIN ASPART 100 UNIT/ML IJ SOLN: 0.0000 [IU] | Freq: Three times a day (TID) | INTRAMUSCULAR | Status: DC

## 2023-07-28 MED ORDER — SODIUM CHLORIDE 0.9 % IV SOLN: Freq: Once | INTRAVENOUS | Status: AC

## 2023-07-28 MED ORDER — SODIUM CHLORIDE 0.9 % IV BOLUS
1000.0000 mL | Freq: Once | INTRAVENOUS | Status: AC
  Administered 2023-07-28: 1000 mL via INTRAVENOUS

## 2023-07-28 NOTE — ED Notes (Signed)
 Orthostatic VS:  Lying 71 186/81 Sitting 74 171/94 Standing 89 152/93 Dizzy and weak upon standing

## 2023-07-28 NOTE — H&P (Signed)
 History and Physical    Candace [name]:[id number] DOB: Aug 07, 1950 DOA: 07/28/2023  Referring MD/NP/PA:   PCP: Nikki Barters, MD   Patient coming from:  The patient is coming from home.     Chief Complaint: Dizziness, lightheadedness, near syncope  HPI: Candace [name] is a 73 y.o. female with medical history significant of vertigo, A-fib on Eliquis , hypertension, hyperlipidemia, diabetes mellitus, COPD, CAD, CABG, diastolic CHF, stroke/TIA, OSA on CPAP, panic attack, carotid artery stenosis, who presents with dizziness, lightheadedness, near syncope.  Patient was last known normal at about 5 AM.  At 6:30, she started feeling dizzy and lightheaded, feeling room spinning around her.  Denies unilateral numbness or tingling in extremities.  She also had blurry vision.  She said she almost passed out, but did not.  No fall.  She states that she had nausea and vomited 6 times with nonbilious nonbloody vomiting, no diarrhea or abdominal pain.  Denies symptoms of UTI. Patient denies chest pain, cough, SOB.  No fever or chills.  Per EDP, pt is found to have positive orthostatic vital signs in ED.   Data reviewed independently and ED Course: pt was found to have WBC 6.2, GFR> 60, troponin 5 --> 5, UA (clear appearance, trace amount leukocyte, negative bacteria, WBC 11-20).  MRI of brain is negative for acute stroke.  Patient is placed in telemetry bed for observation.   EKG: I have personally reviewed.  Sinus rhythm, QTc 523, poor R wave progression, left bundle blockade.  MRI-brain: 1. No acute intracranial abnormality. 2. Moderate chronic microvascular ischemic disease with a few scattered remote lacunar infarcts about the bilateral basal ganglia.   Review of Systems:   General: no fevers, chills, no body weight gain, has fatigue HEENT: no blurry vision, hearing changes or sore throat Respiratory: no dyspnea, coughing, wheezing CV: no chest pain, no palpitations GI: no nausea,  vomiting, abdominal pain, diarrhea, constipation GU: no dysuria, burning on urination, increased urinary frequency, hematuria  Ext: no leg edema Neuro: Has dizziness, lightheadedness, near syncope Skin: no rash, no skin tear. MSK: No muscle spasm, no deformity, no limitation of range of movement in spin Heme: No easy bruising.  Travel history: No recent long distant travel.   Allergy:  Allergies  Allergen Reactions   Tramadol  Other (See Comments)    Causes patient to be off balance and Mental Changes   Lisinopril  Cough   Penicillins Swelling, Rash and Other (See Comments)    Did it involve swelling of the face/tongue/throat, SOB, or low BP? Yes Did it involve sudden or severe rash/hives, skin peeling, or any reaction on the inside of your mouth or nose? Yes Did you need to seek medical attention at a hospital or doctor's office? Yes When did it last happen?      15 years If all above answers are "NO", may proceed with cephalosporin use.     Past Medical History:  Diagnosis Date   Allergy    Anemia    Anxiety    Arrhythmia    Arthritis    Atrial fibrillation (HCC)    CHF (congestive heart failure) (HCC)    COPD (chronic obstructive pulmonary disease) (HCC)    Coronary artery disease    Depression    Diabetes mellitus without complication (HCC)    Dyspnea    doe   Dysrhythmia    GERD (gastroesophageal reflux disease)    Headache    History of hiatal hernia    Hyperlipidemia  Hypertension    Myocardial infarction [facility])    2016, 04/2017   Myocardial infarction with cardiac rehabilitation [facility])    MI 2016/ CABG 8/17    FINISHED CARDIAC REHAB 3 WEEKS AGO   Panic attack    Pneumonia    Reflux    Stroke (HCC) 2015   showed up on MRI; no weakness noted   TIA (transient ischemic attack)    Voice tremor     Past Surgical History:  Procedure Laterality Date   ABDOMINAL HYSTERECTOMY     APPENDECTOMY  1975   ARTERY BIOPSY Right 04/26/2016   Procedure: BIOPSY TEMPORAL  ARTERY;  Surgeon: Mellody Sprout, MD;  Location: [facility] ORS;  Service: ENT;  Laterality: Right;   CARDIAC CATHETERIZATION N/A 11/06/2015   Procedure: Left Heart Cath and Coronary Angiography;  Surgeon: Michelle Aid, MD;  Location: [facility] INVASIVE CV LAB;  Service: Cardiovascular;  Laterality: N/A;   CESAREAN SECTION     COLONOSCOPY  2015   COLONOSCOPY WITH PROPOFOL  N/A 11/03/2018   Procedure: COLONOSCOPY WITH PROPOFOL ;  Surgeon: Irby Mannan, MD;  Location: [facility] ENDOSCOPY;  Service: Endoscopy;  Laterality: N/A;   CORONARY ANGIOPLASTY  04/2017   [facility]   CORONARY ARTERY BYPASS GRAFT N/A 11/10/2015   Procedure: CORONARY ARTERY BYPASS GRAFTING (CABG), ON PUMP, TIMES FOUR, USING LEFT INTERNAL MAMMARY ARTERY, BILATERAL GREATER SAPHENOUS VEINS HARVESTED ENDOSCOPICALLY;  Surgeon: Norita Beauvais, MD;  Location: [facility] OR;  Service: Open Heart Surgery;  Laterality: N/A;  LIMA-LAD; SEQ SVG-OM1-OM2; SVG-PL   CORONARY STENT INTERVENTION N/A 08/05/2016   Procedure: Coronary Stent Intervention;  Surgeon: Percival Brace, MD;  Location: [facility]VAS[facility] CV LAB;  Service: Cardiovascular;  Laterality: N/A;   ESOPHAGOGASTRODUODENOSCOPY (EGD) WITH PROPOFOL  N/A 11/03/2018   Procedure: ESOPHAGOGASTRODUODENOSCOPY (EGD) WITH PROPOFOL ;  Surgeon: Irby Mannan, MD;  Location: [facility] ENDOSCOPY;  Service: Endoscopy;  Laterality: N/A;   HYSTERECTOMY ABDOMINAL WITH SALPINGO-OOPHORECTOMY Bilateral 08/15/2017   Procedure: HYSTERECTOMY ABDOMINAL WITH BILATERAL SALPINGO-OOPHORECTOMY;  Surgeon: Teresa Fender, MD;  Location: [facility] ORS;  Service: Gynecology;  Laterality: Bilateral;   LEFT HEART CATH AND CORONARY ANGIOGRAPHY N/A 08/05/2016   Procedure: Left Heart Cath and Coronary Angiography;  Surgeon: Percival Brace, MD;  Location: [facility] CV LAB;  Service: Cardiovascular;  Laterality: N/A;   LEFT HEART CATH AND CORS/GRAFTS ANGIOGRAPHY N/A 09/19/2018   Procedure: LEFT HEART CATH AND CORS/GRAFTS  ANGIOGRAPHY;  Surgeon: Michelle Aid, MD;  Location: [facility];  Service: Cardiovascular;  Laterality: N/A;   LEFT HEART CATH AND CORS/GRAFTS ANGIOGRAPHY N/A 08/30/2019   Procedure: LEFT HEART CATH AND CORS/GRAFTS ANGIOGRAPHY;  Surgeon: Ronney Cola, MD;  Location: [facility] CV LAB;  Service: Cardiovascular;  Laterality: N/A;   OOPHORECTOMY     TEE WITHOUT CARDIOVERSION N/A 11/10/2015   Procedure: TRANSESOPHAGEAL ECHOCARDIOGRAM (TEE);  Surgeon: Norita Beauvais, MD;  Location[facility];  Service: Open Heart Surgery;  Laterality: N/A;   TUBAL LIGATION      Social History:  reports that she quit smoking about 21 years ago. Her smoking use included cigarettes. She has never used smokeless tobacco. She reports that she does not drink alcohol and does not use drugs.  Family History:  Family History  Problem Relation Age of Onset   Cancer Father    Hypertension Father    Heart disease Father    Asthma Father    Cancer Mother    Hypertension Mother    Pancreatic cancer Mother 40   Cancer Sister  Breast cancer Sister 56   Breast cancer Sister 16   Lung cancer Brother    Pancreatic cancer Sister 62   Cancer Sister      Prior to Admission medications   Medication Sig Start Date End Date Taking? Authorizing Provider  acetaminophen  (TYLENOL ) 325 MG tablet Take 650 mg by mouth every 6 (six) hours as needed for moderate pain or headache.     [provider]  albuterol  (VENTOLIN  HFA) 108 (90 Base) MCG/ACT inhaler Inhale 2 puffs into the lungs every 6 (six) hours as needed for wheezing or shortness of breath. 01/07/21   Nikki Barters, MD  aspirin  EC 81 MG tablet Take 81 mg by mouth daily. Swallow whole.    [provider]  atorvastatin  (LIPITOR ) 80 MG tablet Take 1 tablet (80 mg total) by mouth daily. 07/03/22   Alexander, Natalie, DO  Blood Glucose Monitoring Suppl [facility] VERIO) w/Device KIT Use daily to check blood sugar 03/05/21   Bacigalupo, Angela M,  MD  carbidopa -levodopa  (SINEMET  IR) 10-100 MG tablet Take 1 tablet by mouth 3 (three) times daily. 01/17/23   Alexander, Natalie, DO  COMFORT EZ PEN NEEDLES 32G X 4 MM MISC Use to inject insulin  daily 01/04/22   Normie Becton, FNP  Dulaglutide  (TRULICITY ) 1.5 MG/0.5ML SOPN Inject 1.5 mg into the skin once a week. Typically takes Weds, ok to administer 01/14/23 has not had it this week, can resume weekly on Fridays after today's dose 01/14/23   Alexander, Natalie, DO  ELIQUIS  5 MG TABS tablet Take 5 mg by mouth 2 (two) times daily. 03/26/20   [provider]  ezetimibe  (ZETIA ) 10 MG tablet Take 1 tablet (10 mg total) by mouth daily. 01/19/22   Trenton Frock, PA-C  glucose blood test strip Use to check blood sugars daily as instructed 03/02/22   Simmons-Robinson, Judyann Number, MD  insulin  glargine (LANTUS  SOLOSTAR) 100 UNIT/ML Solostar Pen Inject 12 Units into the skin at bedtime. Patient taking differently: Inject 16 Units into the skin at bedtime. 08/21/21   Sheril Dines, MD  isosorbide  mononitrate (IMDUR ) 120 MG 24 hr tablet Take 1 tablet (120 mg total) by mouth daily. 01/15/23   Alexander, Natalie, DO  JARDIANCE  10 MG TABS tablet Take 25 mg by mouth every morning.    [provider]  Lancets [facility] DELICA PLUS LANCET33G) MISC USE UP TO 4 TIMES DAILY AS DIRECTED 07/10/19   Nikki Barters, MD  nebivolol  (BYSTOLIC ) 2.5 MG tablet Take 1 tablet (2.5 mg total) by mouth daily. 01/18/23   Alexander, Natalie, DO  nitroGLYCERIN  (NITROSTAT ) 0.4 MG SL tablet Place 1 tablet (0.4 mg total) under the tongue every 5 (five) minutes as needed for chest pain. 07/27/16   Altha Athens, MD  nystatin  (NYSTATIN ) powder Apply 1 application topically daily. 02/14/20   Teresa Fender, MD  ondansetron  (ZOFRAN -ODT) 8 MG disintegrating tablet TAKE ONE TABLET BY MOUTH EVERY 8 HOURS AS NEEDED FOR NAUSEA AND VOMITING 10/14/21   Nikki Barters, MD  pantoprazole  (PROTONIX ) 20 MG tablet Take 1 tablet (20 mg total)  by mouth 2 (two) times daily. 05/15/21   Nikki Barters, MD  ranolazine  (RANEXA ) 1000 MG SR tablet Take 1 tablet (1,000 mg total) by mouth 2 (two) times daily. 07/02/22   Alexander, Natalie, DO  sertraline  (ZOLOFT ) 100 MG tablet Take 1 tablet (100 mg total) by mouth daily. Patient taking differently: Take 100 mg by mouth at bedtime. 01/19/22   Trenton Frock, PA-C  cetirizine  (ZYRTEC ) 5  MG tablet Take 2 tablets (10 mg total) by mouth daily. 12/19/18 02/06/20  Nikki Barters, MD    Physical Exam: Vitals:   07/29/23 0030 07/29/23 0045 07/29/23 0100 07/29/23 0106  BP: (!) 172/68 (!) 160/73 (!) 150/66   Pulse: 82 86 84   Resp: 18 17 11    Temp:    98.6 F (37 C)  TempSrc:    Oral  SpO2: 99% 99% 100%   Weight:      Height:       General: Not in acute distress HEENT:       Eyes: PERRL, EOMI, no jaundice       ENT: No discharge from the ears and nose, no pharynx injection, no tonsillar enlargement.        Neck: No JVD, no bruit, no mass felt. Heme: No neck lymph node enlargement. Cardiac: S1/S2, RRR, No murmurs, No gallops or rubs. Respiratory: No rales, wheezing, rhonchi or rubs. GI: Soft, nondistended, nontender, no rebound pain, no organomegaly, BS present. GU: No hematuria Ext: No pitting leg edema bilaterally. 1+DP/PT pulse bilaterally. Musculoskeletal: No joint deformities, No joint redness or warmth, no limitation of ROM in spin. Skin: No rashes.  Neuro: Alert, oriented X3, cranial nerves II-XII grossly intact, moves all extremities normally Psych: Patient is not psychotic, no suicidal or hemocidal ideation.  Labs on Admission: I have personally reviewed following labs and imaging studies  CBC: Recent Labs  Lab 07/28/23 1050  WBC 6.2  HGB 10.8*  HCT 33.0*  MCV 103.1*  PLT 161   Basic Metabolic Panel: Recent Labs  Lab 07/28/23 1050 07/28/23 1726  NA 140  --   K 3.9  --   CL 113*  --   CO2 24  --   GLUCOSE 143*  --   BUN 12  --   CREATININE 0.77  --    CALCIUM  7.7*  --   MG  --  1.9   GFR: Estimated Creatinine Clearance: 59.2 mL/min (by C-G formula based on SCr of 0.77 mg/dL). Liver Function Tests: Recent Labs  Lab 07/28/23 1050  AST 17  ALT <5  ALKPHOS 85  BILITOT 1.1  PROT 5.9*  ALBUMIN  3.1*   Recent Labs  Lab 07/28/23 1050  LIPASE 37   No results for input(s): "AMMONIA" in the last 168 hours. Coagulation Profile: No results for input(s): "INR", "PROTIME" in the last 168 hours. Cardiac Enzymes: No results for input(s): "CKTOTAL", "CKMB", "CKMBINDEX", "TROPONINI" in the last 168 hours. BNP (last 3 results) No results for input(s): "PROBNP" in the last 8760 hours. HbA1C: No results for input(s): "HGBA1C" in the last 72 hours. CBG: Recent Labs  Lab 07/29/23 0122  GLUCAP 74   Lipid Profile: No results for input(s): "CHOL", "HDL", "LDLCALC", "TRIG", "CHOLHDL", "LDLDIRECT" in the last 72 hours. Thyroid  Function Tests: No results for input(s): "TSH", "T4TOTAL", "FREET4", "T3FREE", "THYROIDAB" in the last 72 hours. Anemia Panel: No results for input(s): "VITAMINB12", "FOLATE", "FERRITIN", "TIBC", "IRON", "RETICCTPCT" in the last 72 hours. Urine analysis:    Component Value Date/Time   COLORURINE YELLOW (A) 07/28/2023 1050   APPEARANCEUR CLEAR (A) 07/28/2023 1050   APPEARANCEUR Clear 12/24/2014 0905   LABSPEC 1.030 07/28/2023 1050   LABSPEC 1.030 09/14/2013 2123   PHURINE 7.0 07/28/2023 1050   GLUCOSEU >=500 (A) 07/28/2023 1050   GLUCOSEU Negative 09/14/2013 2123   HGBUR NEGATIVE 07/28/2023 1050   BILIRUBINUR NEGATIVE 07/28/2023 1050   BILIRUBINUR Negative 02/06/2020 1014   BILIRUBINUR Negative 12/24/2014 0905  BILIRUBINUR Negative 09/14/2013 2123   KETONESUR NEGATIVE 07/28/2023 1050   PROTEINUR NEGATIVE 07/28/2023 1050   UROBILINOGEN 0.2 02/06/2020 1014   NITRITE NEGATIVE 07/28/2023 1050   LEUKOCYTESUR TRACE (A) 07/28/2023 1050   LEUKOCYTESUR 3+ 09/14/2013 2123   Sepsis  Labs: @LABRCNTIP (procalcitonin:4,lacticidven:4) )No results found for this or any previous visit (from the past 240 hours).   Radiological Exams on Admission:   Assessment/Plan Principal Problem:   Near syncope Active Problems:   Vertigo   Orthostatic hypotension   Chronic diastolic CHF (congestive heart failure) (HCC)   Paroxysmal atrial fibrillation (HCC)   Essential hypertension   HLD (hyperlipidemia)   Coronary artery disease   Type 2 diabetes mellitus with diabetic polyneuropathy, with long-term current use of insulin  (HCC)   Bilateral carotid artery stenosis   Tremors of nervous system   Depression with anxiety   Prolonged QT interval   Mild obstructive sleep apnea   Overweight (BMI 25.0-29.9)   Assessment and Plan:  Near syncope: Likely multifactorial etiology, including vertigo and orthostatic hypotension.  No focal neurodeficit on physical examination.  MRI of the brain is negative for acute stroke.  -Placed in telemetry bed for observation - Fall precaution - Frequent neurocheck - PT/OT - IV fluid: 1 L normal saline, then 75 cc/h  Vertigo - As needed meclizine  - Fall precaution  Orthostatic hypotension: -IV fluid as above  Chronic diastolic CHF (congestive heart failure) (HCC): 2D echo on 06/30/2022 showed EF of 65 to 70% with grade 1 diastolic dysfunction.  Patient does not have leg edema JVD.  CHF is compensated. -Check BNP  Paroxysmal atrial fibrillation (HCC): Heart rate 78 -Continue Eliquis  - Bystolic   Essential hypertension -IV hydralazine  as needed - Imdur , Bystolic   HLD (hyperlipidemia) -Lipitor , Zetia   Coronary artery disease: No chest pain -Aspirin , Lipitor , Zetia , Imdur , Ranexa   Type 2 diabetes mellitus with diabetic polyneuropathy, with long-term current use of insulin  [facility]): Recent A1c 8.0, poorly controlled.  Patient taking Trulicity , Jardiance , Lantus  16 units daily -Sliding scale insulin  - Glargine insulin  12 units  daily  Bilateral carotid artery stenosis -Aspirin , Lipitor   Tremors of nervous system -Sinemet  - Fall precaution  Depression with anxiety -Hold Zoloft  due to QTc prolonging  Prolonged QT interval: QTc 523 -Check magnesium  level - Hold Zoloft  - Avoid using QT prolonging medications, such as Zofran   Mild obstructive sleep apnea -CPAP  Overweight (BMI 25.0-29.9): Body weight 72.3 kg, BMI 29.14 - Encourage losing weight - Exercise and healthy diet      DVT ppx: On Eliquis   Code Status: Full code (patient stated she wanted to be resuscitated only once)  Family Communication:     not done, no family member is at bed side.   Disposition Plan:  Anticipate discharge back to previous environment  Consults called: None  Admission status and Level of care: Telemetry Medical:    for obs         Dispo: The patient is from: Home              Anticipated d/c is to: Home              Anticipated d/c date is: 1 day              Patient currently is not medically stable to d/c.    Severity of Illness:  The appropriate patient status for this patient is OBSERVATION. Observation status is judged to be reasonable and necessary in order to provide the required intensity of service to ensure the  patient's safety. The patient's presenting symptoms, physical exam findings, and initial radiographic and laboratory data in the context of their medical condition is felt to place them at decreased risk for further clinical deterioration. Furthermore, it is anticipated that the patient will be medically stable for discharge from the hospital within 2 midnights of admission.        Date of Service 07/29/2023    Fidencio [facility]   If 7PM-7AM, please contact night-coverage [email or web address] 07/29/2023, 1:39 AM

## 2023-07-28 NOTE — ED Notes (Signed)
 Patient transported to MRI at this time.

## 2023-07-28 NOTE — ED Triage Notes (Signed)
 Patient reports an episode of feeling near syncope this AM with symptoms of moment of blurry vision, diaphoretic and lightheaded. Reports episode of emesis shortly after and still experiencing nausea.  A&O x4 in triage  Denies vision changes at this time.  Denies sob or cp

## 2023-07-28 NOTE — ED Notes (Signed)
 First Nurse Note: Pt to ED via [facility] from home for weakness, nausea, and vomiting that started this morning.   CBG 175 BP- 100/53 Hr-74 SpO2- 96%  20 G IV in LFA

## 2023-07-28 NOTE — ED Provider Notes (Signed)
 [facility] Provider Note    Event Date/Time   First MD Initiated Contact with Patient 07/28/23 1557     (approximate)   History   Dizziness and Emesis   HPI  Candace [name] is a 73 y.o. female who presents to the emergency department today because of concerns for weakness, dizziness.  The patient woke up this morning around 5:00 because of normal state of health.  She then back to bed.  When she tried to get up at 630 however she noticed that she was very lightheaded and dizzy.  She started shaking.  Family states that they did notice her symptoms of decreased has had slowed and they tried going back to bed to see if she would feel better however she did not. She then developed some nausea and vomited three times. She denies any chest pain or palpitations. Continues to feel lightheaded at the time of my exam. Felt similar last year when she had an admission for weakness. Per chart review the patient had significant orthostatic hypotension at that time and there was also concern for parkinsons.    Physical Exam   Triage Vital Signs: ED Triage Vitals  Encounter Vitals Group     BP 07/28/23 1045 108/61     Systolic BP Percentile --      Diastolic BP Percentile --      Pulse Rate 07/28/23 1045 76     Resp 07/28/23 1045 18     Temp 07/28/23 1045 97.6 F (36.4 C)     Temp Source 07/28/23 1045 Oral     SpO2 07/28/23 1045 99 %     Weight 07/28/23 1046 159 lb 4.8 oz (72.3 kg)     Height 07/28/23 1046 5\' 2"  (1.575 m)     Head Circumference --      Peak Flow --      Pain Score 07/28/23 1046 0     Pain Loc --      Pain Education --      Exclude from Growth Chart --     Most recent vital signs: Vitals:   07/28/23 1045  BP: 108/61  Pulse: 76  Resp: 18  Temp: 97.6 F (36.4 C)  SpO2: 99%   General: Awake, alert, oriented. CV:  Good peripheral perfusion. Regular rate and rhythm. Resp:  Normal effort. Lungs clear. Abd:  No distention. Non  tender. Other:  Some tremors noted in bilateral upper extremities. Face symmetric. EOMI. Strength 5/5 in extremities, sensation grossly intact.    ED Results / Procedures / Treatments   Labs (all labs ordered are listed, but only abnormal results are displayed) Labs Reviewed  COMPREHENSIVE METABOLIC PANEL WITH GFR - Abnormal; Notable for the following components:      Result Value   Chloride 113 (*)    Glucose, Bld 143 (*)    Calcium  7.7 (*)    Total Protein 5.9 (*)    Albumin  3.1 (*)    Anion gap 3 (*)    All other components within normal limits  CBC - Abnormal; Notable for the following components:   RBC 3.20 (*)    Hemoglobin 10.8 (*)    HCT 33.0 (*)    MCV 103.1 (*)    All other components within normal limits  URINALYSIS, ROUTINE W REFLEX MICROSCOPIC - Abnormal; Notable for the following components:   Color, Urine YELLOW (*)    APPearance CLEAR (*)    Glucose, UA >=500 (*)  Leukocytes,Ua TRACE (*)    All other components within normal limits  LIPASE, BLOOD  TROPONIN I (HIGH SENSITIVITY)     EKG  I, Marylynn Soho, attending physician, personally viewed and interpreted this EKG  EKG Time: 1720 Rate: 73 Rhythm: sinus rhythm Axis: normal Intervals: qtc 523 QRS: LBBB ST changes: no st elevation Impression: abnormal ekg  RADIOLOGY I independently interpreted and visualized the MR brain. My interpretation: No acute abnormality, chronic findings. Radiology interpretation:  IMPRESSION:  1. No acute intracranial abnormality.  2. Moderate chronic microvascular ischemic disease with a few  scattered remote lacunar infarcts about the bilateral basal ganglia.      PROCEDURES:  Critical Care performed: No    MEDICATIONS ORDERED IN ED: Medications - No data to display   IMPRESSION / MDM / ASSESSMENT AND PLAN / ED COURSE  I reviewed the triage vital signs and the nursing notes.                              Differential diagnosis includes, but is not  limited to, dehydration, infection, stroke  Patient's presentation is most consistent with acute presentation with potential threat to life or bodily function.   The patient is on the cardiac monitor to evaluate for evidence of arrhythmia and/or significant heart rate changes.  Patient presented to the emergency department today because of concerns for dizziness lightheadedness and noticed that she had some slurred speech.  On exam and multiple deficits although patient does have some fullness.  Per chart review there was question of Parkinson's during previous admission. Blood work sent from triage without any significant anemia or electrolyte abnormality. Will check MR brain. Will check orthostatic vital signs.  MRI without concerning acute abnormality. After 1L bolus IVFs patient attempted ambulation and became unsteady and almost fell. Patient blood pressure did drop. Do have concerns for continued orthostatic etiology. Will order further fluids. Discussed with Dr. Rosalea Collin with the hospitalist service who will evaluate for admission.    FINAL CLINICAL IMPRESSION(S) / ED DIAGNOSES   Final diagnoses:  Dizziness      Note:  This document was prepared using Dragon voice recognition software and may include unintentional dictation errors.    Marylynn Soho, MD 07/28/23 [phone]

## 2023-07-29 DIAGNOSIS — R55 Syncope and collapse: Secondary | ICD-10-CM

## 2023-07-29 LAB — BRAIN NATRIURETIC PEPTIDE: B Natriuretic Peptide: 38.5 pg/mL (ref 0.0–100.0)

## 2023-07-29 LAB — CBG MONITORING, ED
Glucose-Capillary: 74 mg/dL (ref 70–99)
Glucose-Capillary: 103 mg/dL — ABNORMAL HIGH (ref 70–99)

## 2023-07-29 MED ORDER — INSULIN GLARGINE-YFGN 100 UNIT/ML ~~LOC~~ SOLN: 16.0000 [IU] | Freq: Every day | SUBCUTANEOUS | Status: DC

## 2023-07-29 MED ORDER — PANTOPRAZOLE SODIUM 20 MG PO TBEC
20.0000 mg | DELAYED_RELEASE_TABLET | Freq: Two times a day (BID) | ORAL | Status: DC
  Administered 2023-07-29: 20 mg via ORAL
  Filled 2023-07-29: qty 1

## 2023-07-29 MED ORDER — EZETIMIBE 10 MG PO TABS
10.0000 mg | ORAL_TABLET | Freq: Every day | ORAL | Status: DC
  Administered 2023-07-29: 10 mg via ORAL
  Filled 2023-07-29: qty 1

## 2023-07-29 MED ORDER — NEBIVOLOL HCL 2.5 MG PO TABS
2.5000 mg | ORAL_TABLET | Freq: Every day | ORAL | Status: DC
Start: 2023-07-29 — End: 2023-07-29

## 2023-07-29 MED ORDER — ASPIRIN 81 MG PO TBEC
81.0000 mg | DELAYED_RELEASE_TABLET | Freq: Every day | ORAL | Status: DC
  Filled 2023-07-29: qty 1

## 2023-07-29 MED ORDER — SERTRALINE HCL 100 MG PO TABS: 100.0000 mg | ORAL_TABLET | Freq: Every day | ORAL | Status: AC

## 2023-07-29 MED ORDER — RANOLAZINE ER 500 MG PO TB12
1000.0000 mg | ORAL_TABLET | Freq: Two times a day (BID) | ORAL | Status: DC
  Administered 2023-07-29: 1000 mg via ORAL
  Filled 2023-07-29: qty 2

## 2023-07-29 MED ORDER — ATORVASTATIN CALCIUM 20 MG PO TABS
80.0000 mg | ORAL_TABLET | Freq: Every day | ORAL | Status: DC
  Administered 2023-07-29: 80 mg via ORAL
  Filled 2023-07-29: qty 4

## 2023-07-29 MED ORDER — ISOSORBIDE MONONITRATE ER 60 MG PO TB24
120.0000 mg | ORAL_TABLET | Freq: Every day | ORAL | Status: DC
  Administered 2023-07-29: 120 mg via ORAL
  Filled 2023-07-29: qty 2

## 2023-07-29 MED ORDER — CARBIDOPA-LEVODOPA 10-100 MG PO TABS
1.0000 | ORAL_TABLET | Freq: Three times a day (TID) | ORAL | Status: DC
  Administered 2023-07-29: 1 via ORAL
  Filled 2023-07-29 (×2): qty 1

## 2023-07-29 MED ORDER — NITROGLYCERIN 0.4 MG SL SUBL: 0.4000 mg | SUBLINGUAL_TABLET | SUBLINGUAL | Status: DC | PRN

## 2023-07-29 MED ORDER — APIXABAN 5 MG PO TABS
5.0000 mg | ORAL_TABLET | Freq: Two times a day (BID) | ORAL | Status: DC
  Administered 2023-07-29: 5 mg via ORAL
  Filled 2023-07-29: qty 1

## 2023-07-29 NOTE — Discharge Summary (Signed)
 Physician Discharge Summary   Patient: Candace [name] MRN: [id number]  DOB: July 27, 1950   Admit:     Date of Admission: 07/28/2023 Admitted from: home   Discharge: Date of discharge: 07/29/23 Disposition: Home Condition at discharge: good  CODE STATUS: FULL CODE     Discharge Physician: Melodi Sprung, DO [facility]     PCP: Nikki Barters, MD  Recommendations for Outpatient Follow-up:  Follow up with PCP Nikki Barters, MD in 1-2 weeks Referral to neurology - suspect parkinsonism, eval for this and/or other cause vertigo, tremor, unsteady gait    Discharge Instructions     Ambulatory referral to Neurology   Complete by: As directed    [facility] - Neurology  [place], [place] [facility] [address]  [place], [place] [address]  [phone]         Discharge Diagnoses: Principal Problem:   Near syncope Active Problems:   Vertigo   Orthostatic hypotension   Chronic diastolic CHF (congestive heart failure) (HCC)   Paroxysmal atrial fibrillation (HCC)   Essential hypertension   HLD (hyperlipidemia)   Coronary artery disease   Type 2 diabetes mellitus with diabetic polyneuropathy, with long-term current use of insulin  (HCC)   Bilateral carotid artery stenosis   Tremors of nervous system   Depression with anxiety   Prolonged QT interval   Mild obstructive sleep apnea   Overweight (BMI 25.0-29.9)      Hospital course / significant events:   HPI: Candace [name] is a 73 y.o. female with medical history significant of vertigo, A-fib on Eliquis , hypertension, hyperlipidemia, diabetes mellitus, COPD, CAD, CABG, diastolic CHF, stroke/TIA, OSA on CPAP, panic attack, carotid artery stenosis. She presented to ED via [facility] from home for near syncope - symptoms of momentary blurry vision, diaphoretic and lightheaded. Reports episode of emesis shortly after and still experiencing nausea. started 07/28/23 morning.  Felt similar  last year when she had an admission for weakness. Per chart review the patient had significant orthostatic hypotension at that time and there was also concern for parkinsons.   05/01: to ED. (+)orthostatic vital signs in ED.  WBC 6.2, GFR> 60, troponin 5 --> 5, UA (clear appearance, trace amount leukocyte, negative bacteria, WBC 11-20).  MRI of brain is negative for acute stroke.  Patient is placed in telemetry bed for observation.  05/02: did well w/ PT/OT, no orthostasis now. Ambulating independently. Pt feels great and feels comfortable for discharge home. She has already been w/u in the past year w/ holter monitor. Symptoms c/w BPPV / orthostatic hypotension - suspect autonomic dysfunction possibly parkinsons, pt had not followed up w/ neurology so referral was ordered prior to discharge      Consultants:  none  Procedures/Surgeries: none      ASSESSMENT & PLAN:   Near syncope: Likely multifactorial etiology, including vertigo and orthostatic hypotension.  No focal neurodeficit on physical examination.  MRI of the brain is negative for acute stroke.Symptoms c/w BPPV / orthostatic hypotension - suspect autonomic dysfunction possibly parkinsons, pt had not followed up w/ neurology so referral was ordered prior to discharge  Encourage hydration, compliance w/ midodrine  Neuro referral   Vertigo as needed meclizine  Fall precaution   Orthostatic hypotension: S/p IV fluid as above   Chronic diastolic CHF (congestive heart failure) (HCC): 2D echo on 06/30/2022 showed EF of 65 to 70% with grade 1 diastolic dysfunction.  Patient does not have leg edema JVD.  CHF is compensated.  Paroxysmal atrial fibrillation (HCC): Heart rate 78 Continue Eliquis  Bystolic    Essential hypertension Imdur , Bystolic    HLD (hyperlipidemia) Lipitor , Zetia    Coronary artery disease: No chest pain Aspirin , Lipitor , Zetia , Imdur , Ranexa    Type 2 diabetes mellitus with diabetic polyneuropathy, with  long-term current use of insulin  [facility]): Recent A1c 8.0, poorly controlled.  Patient taking Trulicity , Jardiance , Lantus  16 units daily   Bilateral carotid artery stenosis Aspirin , Lipitor    Tremors of nervous system suspect parkinons Sinemet  Fall precaution   Depression with anxiety Hold Zoloft  due to QTc prolonging   Prolonged QT interval: QTc 523 Check magnesium  level Hold Zoloft  Avoid using QT prolonging medications, such as Zofran    Mild obstructive sleep apnea -CPAP    overweight based on BMI: Body mass index is 29.14 kg/m.Aaron Aas Significantly low or high BMI is associated with higher medical risk.  Underweight - under 18  overweight - 25 to 29 obese - 30 or more Class 1 obesity: BMI of 30.0 to 34 Class 2 obesity: BMI of 35.0 to 39 Class 3 obesity: BMI of 40.0 to 49 Super Morbid Obesity: BMI 50-59 Super-super Morbid Obesity: BMI 60+ Healthy nutrition and physical activity advised as adjunct to other disease management and risk reduction treatments             Discharge Instructions  Allergies as of 07/29/2023       Reactions   Tramadol  Other (See Comments)   Causes patient to be off balance and Mental Changes   Lisinopril  Cough   Penicillins Swelling, Rash, Other (See Comments)   Did it involve swelling of the face/tongue/throat, SOB, or low BP? Yes Did it involve sudden or severe rash/hives, skin peeling, or any reaction on the inside of your mouth or nose? Yes Did you need to seek medical attention at a hospital or doctor's office? Yes When did it last happen?      15 years If all above answers are "NO", may proceed with cephalosporin use.        Medication List     STOP taking these medications    aspirin  EC 81 MG tablet   nebivolol  2.5 MG tablet Commonly known as: BYSTOLIC    nystatin  powder Commonly known as: nystatin    ondansetron  8 MG disintegrating tablet Commonly known as: ZOFRAN -ODT       TAKE these medications    acetaminophen   325 MG tablet Commonly known as: TYLENOL  Take 650 mg by mouth every 6 (six) hours as needed for moderate pain or headache.   albuterol  108 (90 Base) MCG/ACT inhaler Commonly known as: VENTOLIN  HFA Inhale 2 puffs into the lungs every 6 (six) hours as needed for wheezing or shortness of breath.   atorvastatin  80 MG tablet Commonly known as: LIPITOR  Take 1 tablet (80 mg total) by mouth daily.   carbidopa -levodopa  10-100 MG tablet Commonly known as: SINEMET  IR Take 1 tablet by mouth 3 (three) times daily.   Comfort EZ Pen Needles 32G X 4 MM Misc Generic drug: Insulin  Pen Needle Use to inject insulin  daily   Eliquis  5 MG Tabs tablet Generic drug: apixaban  Take 5 mg by mouth 2 (two) times daily.   ezetimibe  10 MG tablet Commonly known as: ZETIA  Take 1 tablet (10 mg total) by mouth daily.   glucose blood test strip Use to check blood sugars daily as instructed   isosorbide  mononitrate 120 MG 24 hr tablet Commonly known as: IMDUR  Take 1 tablet (120 mg total) by mouth daily.   Jardiance  10  MG Tabs tablet Generic drug: empagliflozin  Take 25 mg by mouth every morning.   Lantus  SoloStar 100 UNIT/ML Solostar Pen Generic drug: insulin  glargine Inject 12 Units into the skin at bedtime. What changed: how much to take   midodrine  2.5 MG tablet Commonly known as: PROAMATINE  Take 2.5 mg by mouth 2 (two) times daily with a meal.   nitroGLYCERIN  0.4 MG SL tablet Commonly known as: NITROSTAT  Place 1 tablet (0.4 mg total) under the tongue every 5 (five) minutes as needed for chest pain.   OneTouch Delica Plus Lancet33G Misc USE UP TO 4 TIMES DAILY AS DIRECTED   OneTouch Verio w/Device Kit Use daily to check blood sugar   pantoprazole  20 MG tablet Commonly known as: Protonix  Take 1 tablet (20 mg total) by mouth 2 (two) times daily.   propranolol  ER 60 MG 24 hr capsule Commonly known as: INDERAL  LA Take 60 mg by mouth daily.   ranolazine  1000 MG SR tablet Commonly known as:  RANEXA  Take 1 tablet (1,000 mg total) by mouth 2 (two) times daily.   sertraline  100 MG tablet Commonly known as: ZOLOFT  Take 1 tablet (100 mg total) by mouth at bedtime.   sucralfate  1 g tablet Commonly known as: CARAFATE  Take 1 g by mouth 4 (four) times daily.   Trulicity  1.5 MG/0.5ML Soaj Generic drug: Dulaglutide  Inject 1.5 mg into the skin once a week. Typically takes Weds, ok to administer 01/14/23 has not had it this week, can resume weekly on Fridays after today's dose         Follow-up Information     Nikki Barters, MD. Schedule an appointment as soon as possible for a visit.   Specialty: Family Medicine Why: hosptial follow up Contact information: [address] [place] [address] [phone]         Bufford Carne, MD. Schedule an appointment as soon as possible for a visit.   Specialty: Neurology Why: referral palced for neurology evaluation for Dr Walden Guise or one of his colleagues in clinic - follow up on vertigo, tremor, and suspect parkinsons Contact information: [address] [facility] [place] [address] [phone]                 Allergies  Allergen Reactions   Tramadol  Other (See Comments)    Causes patient to be off balance and Mental Changes   Lisinopril  Cough   Penicillins Swelling, Rash and Other (See Comments)    Did it involve swelling of the face/tongue/throat, SOB, or low BP? Yes Did it involve sudden or severe rash/hives, skin peeling, or any reaction on the inside of your mouth or nose? Yes Did you need to seek medical attention at a hospital or doctor's office? Yes When did it last happen?      15 years If all above answers are "NO", may proceed with cephalosporin use.      Subjective: pt feeling well this morning, no concerns and asking for discharge home if she does ok w/ PT/OT. Denies CP/SOB, no HA/VC, lightheadedness has resolved    Discharge Exam: BP (!) 160/77    Pulse 78   Temp 98.5 F (36.9 C) (Oral)   Resp 20   Ht 5\' 2"  (1.575 m)   Wt 72.3 kg   SpO2 100%   BMI 29.14 kg/m  General: Pt is alert, awake, not in acute distress Cardiovascular: RRR, S1/S2 +, no rubs, no gallops Respiratory: CTA bilaterally, no wheezing, no rhonchi Abdominal: Soft,  NT, ND, bowel sounds +wnl Extremities: no edema, no cyanosis     The results of significant diagnostics from this hospitalization (including imaging, microbiology, ancillary and laboratory) are listed below for reference.     Microbiology: No results found for this or any previous visit (from the past 240 hours).   Labs: BNP (last 3 results) Recent Labs    07/28/23 1050  BNP 38.5   Basic Metabolic Panel: Recent Labs  Lab 07/28/23 1050 07/28/23 1726  NA 140  --   K 3.9  --   CL 113*  --   CO2 24  --   GLUCOSE 143*  --   BUN 12  --   CREATININE 0.77  --   CALCIUM  7.7*  --   MG  --  1.9   Liver Function Tests: Recent Labs  Lab 07/28/23 1050  AST 17  ALT <5  ALKPHOS 85  BILITOT 1.1  PROT 5.9*  ALBUMIN  3.1*   Recent Labs  Lab 07/28/23 1050  LIPASE 37   No results for input(s): "AMMONIA" in the last 168 hours. CBC: Recent Labs  Lab 07/28/23 1050  WBC 6.2  HGB 10.8*  HCT 33.0*  MCV 103.1*  PLT 161   Cardiac Enzymes: No results for input(s): "CKTOTAL", "CKMB", "CKMBINDEX", "TROPONINI" in the last 168 hours. BNP: Invalid input(s): "POCBNP" CBG: Recent Labs  Lab 07/29/23 0122 07/29/23 0744  GLUCAP 74 103*   D-Dimer No results for input(s): "DDIMER" in the last 72 hours. Hgb A1c No results for input(s): "HGBA1C" in the last 72 hours. Lipid Profile No results for input(s): "CHOL", "HDL", "LDLCALC", "TRIG", "CHOLHDL", "LDLDIRECT" in the last 72 hours. Thyroid  function studies No results for input(s): "TSH", "T4TOTAL", "T3FREE", "THYROIDAB" in the last 72 hours.  Invalid input(s): "FREET3" Anemia work up No results for input(s): "VITAMINB12", "FOLATE",  "FERRITIN", "TIBC", "IRON", "RETICCTPCT" in the last 72 hours. Urinalysis    Component Value Date/Time   COLORURINE YELLOW (A) 07/28/2023 1050   APPEARANCEUR CLEAR (A) 07/28/2023 1050   APPEARANCEUR Clear 12/24/2014 0905   LABSPEC 1.030 07/28/2023 1050   LABSPEC 1.030 09/14/2013 2123   PHURINE 7.0 07/28/2023 1050   GLUCOSEU >=500 (A) 07/28/2023 1050   GLUCOSEU Negative 09/14/2013 2123   HGBUR NEGATIVE 07/28/2023 1050   BILIRUBINUR NEGATIVE 07/28/2023 1050   BILIRUBINUR Negative 02/06/2020 1014   BILIRUBINUR Negative 12/24/2014 0905   BILIRUBINUR Negative 09/14/2013 2123   KETONESUR NEGATIVE 07/28/2023 1050   PROTEINUR NEGATIVE 07/28/2023 1050   UROBILINOGEN 0.2 02/06/2020 1014   NITRITE NEGATIVE 07/28/2023 1050   LEUKOCYTESUR TRACE (A) 07/28/2023 1050   LEUKOCYTESUR 3+ 09/14/2013 2123   Sepsis Labs Recent Labs  Lab 07/28/23 1050  WBC 6.2   Microbiology No results found for this or any previous visit (from the past 240 hours). Imaging MR BRAIN WO CONTRAST Result Date: 07/28/2023 CLINICAL DATA:  Initial evaluation for acute weakness, dizziness. EXAM: MRI HEAD WITHOUT CONTRAST TECHNIQUE: Multiplanar, multiecho pulse sequences of the brain and surrounding structures were obtained without intravenous contrast. COMPARISON:  MRI from 01/10/2023 FINDINGS: Brain: Cerebral volume within normal limits. Patchy T2/FLAIR hyperintensity involving the periventricular deep white matter both cerebral hemispheres a, consistent with chronic small vessel ischemic disease. Mild patchy involvement of the pons noted. Overall, appearance is moderate in nature. Multiple superimposed remote lacunar infarcts present about the bilateral basal ganglia. Possible small remote right parietal infarct noted (series 11, image 34). No evidence for acute or subacute infarct. Gray-white matter differentiation maintained. No acute intracranial hemorrhage.  Single punctate chronic microhemorrhage noted at the left temporal  occipital cortex, of doubtful significance in isolation. No mass lesion, midline shift or mass effect. No hydrocephalus or extra-axial fluid collection. Pituitary gland within normal limits. Vascular: Major intracranial vascular flow voids are maintained. Skull and upper cervical spine: Cranial junction with normal limits. Degenerative spondylosis at C3-4 with mild spinal stenosis. Bone marrow signal intensity within normal limits. No scalp soft tissue abnormality. Sinuses/Orbits: Globes orbital soft tissues within normal limits. Paranasal sinuses are largely clear. No significant mastoid effusion. Other: None. IMPRESSION: 1. No acute intracranial abnormality. 2. Moderate chronic microvascular ischemic disease with a few scattered remote lacunar infarcts about the bilateral basal ganglia. Electronically Signed   By: Virgia Griffins M.D.   On: 07/28/2023 21:30      Time coordinating discharge: over 30 minutes  SIGNED:  Anis Degidio DO [facility]

## 2023-07-29 NOTE — Evaluation (Signed)
 Occupational Therapy Evaluation Patient Details Name: Candace [name] MRN: [id number] DOB: 02-Feb-1951 Today's Date: 07/29/2023   History of Present Illness   73 y/o female presented to ED for near syncope, dizziness. PMH: HTN, T2DM, anxiety, depression, CAD s/p CABG, hx of CVA, Afib on Eliquis .     Clinical Impressions Pt was seen for OT evaluation this date. PTA, pt lives in a ground level apartment on her own with level entry. Pt is IND at baseline without AD use, drives and goes to grocery store. Pt is currently MOD I with bed mobility. Orthostatic vitals taken during session and are [facility]. Noted to be soiled from purewick malfunction requiring full linen and clothing change. Pt performed STS from EOB without AD with CGA/SBA as it was her first time up in a bit she said. No dizziness reported and pt tolerated standing for extended period of time while performing standing peri-care and UB dressing to doff/don new gown with SBA. Returned to supine for new brief to be donned with assist, but pt able to bridge mult trials for this with IND. No further acute OT needs with no follow up therapy needed on DC. Pt has all DME at home and family to check in on her.   If plan is discharge home, recommend the following:         Functional Status Assessment   Patient has not had a recent decline in their functional status     Equipment Recommendations   None recommended by OT     Recommendations for Other Services         Precautions/Restrictions   Precautions Precautions: Fall Recall of Precautions/Restrictions: Intact Restrictions Weight Bearing Restrictions Per Provider Order: No     Mobility Bed Mobility Overal bed mobility: Modified Independent                  Transfers Overall transfer level: Needs assistance   Transfers: Sit to/from Stand Sit to Stand: Contact guard assist, From elevated surface, Supervision           General transfer comment: BLEs  resting on bed for support while standing at times; mild balance deficits with initial stand, but able to perform peri-care standing at EOB with SUP/SBA      Balance Overall balance assessment: Needs assistance   Sitting balance-[name] Scale: Good     Standing balance support: During functional activity, No upper extremity supported Standing balance-[name] Scale: Good Standing balance comment: SUP for standing peri-care and UB dressing                           ADL either performed or assessed with clinical judgement   ADL Overall ADL's : Needs assistance/impaired             Lower Body Bathing: Contact guard assist;Sit to/from stand   Upper Body Dressing : Supervision/safety;Standing   Lower Body Dressing: Bed level;Moderate assistance Lower Body Dressing Details (indicate cue type and reason): to don brief-likely could have done a pull up with Min/CGA; pt able to bridge in bed mult times for donning brief     Toileting- Clothing Manipulation and Hygiene: Supervision/safety;Sit to/from stand               Vision         Perception         Praxis         Pertinent Vitals/Pain Pain Assessment Pain Assessment: No/denies pain  Extremity/Trunk Assessment Upper Extremity Assessment Upper Extremity Assessment: Overall WFL for tasks assessed   Lower Extremity Assessment Lower Extremity Assessment: Generalized weakness       Communication Communication Communication: No apparent difficulties   Cognition Arousal: Alert Behavior During Therapy: WFL for tasks assessed/performed Cognition: No apparent impairments                               Following commands: Intact       Cueing  General Comments      VSS with no dizzines reported   Exercises Other Exercises Other Exercises: Edu on role of OT in acute setting.   Shoulder Instructions      Home Living Family/patient expects to be discharged to:: Private  residence Living Arrangements: Alone Available Help at Discharge: Family;Available PRN/intermittently (daughter, son, granddaughter closeby) Type of Home: Apartment Home Access: Level entry     Home Layout: One level     Bathroom Shower/Tub: Tub/shower unit         Home Equipment: Agricultural consultant (2 wheels);Cane - single point;Tub bench          Prior Functioning/Environment Prior Level of Function : Independent/Modified Independent;Driving             Mobility Comments: reports independence without AD use; returned home from rehab a week before Thanksgiving and has been IND since ADLs Comments: IND with ADLs and IADLs including cooking, cleaning, etc.    OT Problem List: Decreased activity tolerance   OT Treatment/Interventions:        OT Goals(Current goals can be found in the care plan section)       OT Frequency:       Co-evaluation              AM-PAC OT "6 Clicks" Daily Activity     Outcome Measure Help from another person eating meals?: None Help from another person taking care of personal grooming?: None Help from another person toileting, which includes using toliet, bedpan, or urinal?: None Help from another person bathing (including washing, rinsing, drying)?: None Help from another person to put on and taking off regular upper body clothing?: None Help from another person to put on and taking off regular lower body clothing?: None 6 Click Score: 24   End of Session Nurse Communication: Mobility status  Activity Tolerance: Patient tolerated treatment well Patient left: in bed (with PT handoff)  OT Visit Diagnosis: Other abnormalities of gait and mobility (R26.89)                Time: 1610-9604 OT Time Calculation (min): 32 min Charges:  OT General Charges $OT Visit: 1 Visit OT Evaluation $OT Eval Moderate Complexity: 1 Mod OT Treatments $Self Care/Home Management : 8-22 mins  Shanaia Sievers, OTR/L 07/29/23, 11:37 AM  Leonard Raker 07/29/2023, 11:33 AM

## 2023-07-29 NOTE — ED Notes (Signed)
 Patient provided with sandwich box, graham crackers and peanut butter to prevent BG from dropping lower.

## 2023-07-29 NOTE — Evaluation (Signed)
 Physical Therapy Evaluation Patient Details Name: Candace [name] MRN: [id number] DOB: September 21, 1950 Today's Date: 07/29/2023  History of Present Illness  Pt is a 73 y.o. female presenting to hospital 07/28/23 with c/o weakness and dizziness; near syncope. (+) orthostatic vital signs in ED.  Pt admitted with near syncope, vertigo, orthostatic hypotension.  PMH includes vertigo, a-fib on Eliquis , htn, HLD, DM, COPD, CAD, CABG, diastolic CHF, stroke/TIA, OSA on CPAP, panic attack, carotid artery stenosis.  Clinical Impression  Prior to recent medical concerns, pt reports being independent with ambulation; lives alone in 1st floor apt; no recent falls reported.  No c/o pain during session.  Currently pt is modified independent with bed mobility; SBA with transfer from ED stretcher bed; and CGA progressing to SBA ambulating in hallway (no AD use).  Pt appearing shaky in general during session (appears baseline) but no loss of balance noted with standing/walking static and dynamic activities.  No c/o dizziness during session (OT recently obtained orthostatic vitals and reports being negative).  Pt would currently benefit from skilled PT to address noted impairments and functional limitations (see below for any additional details).  Upon hospital discharge, no further PT needs anticipated (discussed with pt who was in agreement).     If plan is discharge home, recommend the following: Assist for transportation   Can travel by private vehicle        Equipment Recommendations None recommended by PT  Recommendations for Other Services       Functional Status Assessment Patient has had a recent decline in their functional status and demonstrates the ability to make significant improvements in function in a reasonable and predictable amount of time.     Precautions / Restrictions Precautions Precautions: Fall Recall of Precautions/Restrictions: Intact Restrictions Weight Bearing Restrictions Per Provider  Order: No      Mobility  Bed Mobility Overal bed mobility: Modified Independent             General bed mobility comments: Semi-supine to/from sitting without any noted difficulties    Transfers Overall transfer level: Needs assistance Equipment used: None Transfers: Sit to/from Stand Sit to Stand: Supervision           General transfer comment: steady transfer from bed    Ambulation/Gait Ambulation/Gait assistance: Contact guard assist, Supervision Gait Distance (Feet): 200 Feet Assistive device: None Gait Pattern/deviations: Step-through pattern       General Gait Details: pt appearing a little shaky in general but no loss of balance noted  Stairs            Wheelchair Mobility     Tilt Bed    Modified Rankin (Stroke Patients Only)       Balance Overall balance assessment: Needs assistance Sitting-balance support: No upper extremity supported, Feet supported Sitting balance-[name] Scale: Good Sitting balance - Comments: steady reaching within BOS   Standing balance support: During functional activity, No upper extremity supported Standing balance-[name] Scale: Good Standing balance comment: steady with ambulation and head turns R/L/up/down, increasing/decreasing speed, and turning and stopping                             Pertinent Vitals/Pain Pain Assessment Pain Assessment: No/denies pain HR 77-108 bpm and SpO2 sats 98% or greater on room air during sessions activities.    Home Living Family/patient expects to be discharged to:: Private residence Living Arrangements: Alone Available Help at Discharge: Family;Available PRN/intermittently (daughter, son, granddaughter nearby) Type of  Home: Apartment Home Access: Level entry (1st floor)       Home Layout: One level Home Equipment: Agricultural consultant (2 wheels);Cane - single point;Tub bench      Prior Function Prior Level of Function : Independent/Modified Independent;Driving              Mobility Comments: Independent with ambulation; pt reports no recent falls. ADLs Comments: Independent with ADL's and IADL's including, cooking, cleaning, driving     Extremity/Trunk Assessment   Upper Extremity Assessment Upper Extremity Assessment: Overall WFL for tasks assessed    Lower Extremity Assessment Lower Extremity Assessment: Generalized weakness       Communication   Communication Communication: No apparent difficulties    Cognition Arousal: Alert Behavior During Therapy: WFL for tasks assessed/performed   PT - Cognitive impairments: No apparent impairments                         Following commands: Intact       Cueing Cueing Techniques: Verbal cues     General Comments General comments (skin integrity, edema, etc.): Pt reporting no dizziness throughout session    Exercises     Assessment/Plan    PT Assessment Patient needs continued PT services  PT Problem List Decreased strength;Decreased mobility       PT Treatment Interventions DME instruction;Gait training;Functional mobility training;Therapeutic activities;Therapeutic exercise;Balance training;Patient/family education    PT Goals (Current goals can be found in the Care Plan section)  Acute Rehab PT Goals Patient Stated Goal: to improve overall strength PT Goal Formulation: With patient Time For Goal Achievement: 08/12/23 Potential to Achieve Goals: Good    Frequency Min 1X/week     Co-evaluation               AM-PAC PT "6 Clicks" Mobility  Outcome Measure Help needed turning from your back to your side while in a flat bed without using bedrails?: None Help needed moving from lying on your back to sitting on the side of a flat bed without using bedrails?: None Help needed moving to and from a bed to a chair (including a wheelchair)?: A Little Help needed standing up from a chair using your arms (e.g., wheelchair or bedside chair)?: A Little Help  needed to walk in hospital room?: A Little Help needed climbing 3-5 steps with a railing? : A Little 6 Click Score: 20    End of Session Equipment Utilized During Treatment: Gait belt Activity Tolerance: Patient tolerated treatment well Patient left: in bed;with call bell/phone within reach Nurse Communication: Mobility status;Precautions;Other (comment) (pt requesting something to assist with dentures staying in place) PT Visit Diagnosis: Other abnormalities of gait and mobility (R26.89);Muscle weakness (generalized) (M62.81)    Time: 1610-9604 PT Time Calculation (min) (ACUTE ONLY): 14 min   Charges:   PT Evaluation $PT Eval Low Complexity: 1 Low   PT General Charges $$ ACUTE PT VISIT: 1 Visit        Amador Junes, PT 07/29/23, 11:44 AM

## 2023-07-29 NOTE — Hospital Course (Signed)
 Hospital course / significant events:   HPI: Candace [name] is a 73 y.o. female with medical history significant of vertigo, A-fib on Eliquis , hypertension, hyperlipidemia, diabetes mellitus, COPD, CAD, CABG, diastolic CHF, stroke/TIA, OSA on CPAP, panic attack, carotid artery stenosis. She presented to ED via [facility] from home for near syncope - symptoms of momentary blurry vision, diaphoretic and lightheaded. Reports episode of emesis shortly after and still experiencing nausea. Started 07/28/23 morning.  Felt similar last year when she had an admission for weakness. Per chart review the patient had significant orthostatic hypotension at that time and there was also concern for parkinsons.   05/01: to ED. (+)orthostatic vital signs in ED.  WBC 6.2, GFR> 60, troponin 5 --> 5, UA (clear appearance, trace amount leukocyte, negative bacteria, WBC 11-20).  MRI of brain is negative for acute stroke.  Patient is placed in telemetry bed for observation.  05/02:      Consultants:  ***  Procedures/Surgeries: ***      ASSESSMENT & PLAN:   Near syncope: Likely multifactorial etiology, including vertigo and orthostatic hypotension.  No focal neurodeficit on physical examination.  MRI of the brain is negative for acute stroke.   -Placed in telemetry bed for observation - Fall precaution - Frequent neurocheck - PT/OT - IV fluid: 1 L normal saline, then 75 cc/h   Vertigo - As needed meclizine  - Fall precaution   Orthostatic hypotension: -IV fluid as above   Chronic diastolic CHF (congestive heart failure) (HCC): 2D echo on 06/30/2022 showed EF of 65 to 70% with grade 1 diastolic dysfunction.  Patient does not have leg edema JVD.  CHF is compensated. -Check BNP   Paroxysmal atrial fibrillation (HCC): Heart rate 78 -Continue Eliquis  -Bystolic    Essential hypertension -IV hydralazine  as needed - Imdur , Bystolic    HLD (hyperlipidemia) -Lipitor , Zetia    Coronary artery disease: No chest  pain -Aspirin , Lipitor , Zetia , Imdur , Ranexa    Type 2 diabetes mellitus with diabetic polyneuropathy, with long-term current use of insulin  [facility]): Recent A1c 8.0, poorly controlled.  Patient taking Trulicity , Jardiance , Lantus  16 units daily -Sliding scale insulin  - Glargine insulin  12 units daily   Bilateral carotid artery stenosis -Aspirin , Lipitor    Tremors of nervous system -Sinemet  - Fall precaution   Depression with anxiety -Hold Zoloft  due to QTc prolonging   Prolonged QT interval: QTc 523 -Check magnesium  level - Hold Zoloft  - Avoid using QT prolonging medications, such as Zofran    Mild obstructive sleep apnea -CPAP    overweight based on BMI: Body mass index is 29.14 kg/m.Aaron Aas Significantly low or high BMI is associated with higher medical risk.  Underweight - under 18  overweight - 25 to 29 obese - 30 or more Class 1 obesity: BMI of 30.0 to 34 Class 2 obesity: BMI of 35.0 to 39 Class 3 obesity: BMI of 40.0 to 49 Super Morbid Obesity: BMI 50-59 Super-super Morbid Obesity: BMI 60+ Healthy nutrition and physical activity advised as adjunct to other disease management and risk reduction treatments    DVT prophylaxis: *** IV fluids: *** continuous IV fluids  Nutrition: *** Central lines / other devices: ***  Code Status: *** ACP documentation reviewed: *** none on file in [facility]  TOC needs: *** Medical barriers to dispo: ***. Expected medical readiness for discharge ***.
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
## 2023-08-26 ENCOUNTER — Other Ambulatory Visit: Payer: Self-pay | Admitting: Student

## 2023-08-26 DIAGNOSIS — I2089 Other forms of angina pectoris: Secondary | ICD-10-CM

## 2023-08-26 DIAGNOSIS — R0602 Shortness of breath: Secondary | ICD-10-CM

## 2023-08-26 DIAGNOSIS — I25118 Atherosclerotic heart disease of native coronary artery with other forms of angina pectoris: Secondary | ICD-10-CM

## 2023-09-02 NOTE — Procedures (Signed)
 [facility], [facility] Department of Neurology, Neurophysiology Laboratory [address]  [place], [place] [address]  Office:  [phone] / Fax:  [phone]  ROUTINE EEG Monitoring Report  Name:  Candace [name]                           Date of Birth:  08/05/1950    Account No:  [id number]   Referring Physician:  Dr. Maree Recording Date:09/02/2023 Duration: 30 minutes Leads On Tech: Sabrina Leads Off Tech: Samule  Leads On Time:10:08 am Leads Off Time: 10:38 am  Technologist commnents: 8 Hz PDR, stage I and stage II sleep. No HV due to age limitation.  CLINICAL HISTORY AND INDICATION/S:  73 y.o. female is referred for EEG due to evaluate for seizures. Episodes of tremors.  The patient's identity was confirmed by two factors.  The EEG order was verified. The procedure, equipment, and anticipated test duration were reviewed with the patient.  Time was allowed for any questions and the patient agreed to proceed with the test.  Twenty-five electrodes were placed and the study was initiated.  Anti Seizure Medications: None listed  REASON:  Evaluate for ictal versus interictal epileptiform activity. Characterize possible seizure disorder.  TECHNICAL: Electroencephalogram of the above-mentioned patient was acquired with [employer] equipment. Electrodes were placed according to an internationally standardized 10-20 system, after individualized distance measurement. The study was recorded digitally with a bandpass of 1-70 Hz and a sampling rate of at least 200Hz  and was reviewed with the possibility of multiple reformatting. At least 18 (more than a minimal 8 channel required) or more channels were applied according to the situation. There was an EKG channel.  The EEG during wakefulness demonstrated a 8-9 Hz occipital dominant alpha rhythm that is symmetrically distributed and reactive to eye-opening and closure.  There were no epileptiform  discharges.  Hyperventilation was not performed due to the patient's condition.  Photic stimulation did not induce seizure activity and the photic driving response was not seen.  The patient entered sleep stages I and II. No abnormal activation occurred during sleep or at the time of arousal.   IMPRESSION:  This is a normal awake and sleep record, without EEG evidence for focality or epileptiform discharges.   CLINICAL CORRELATION: This EEG finding did not support the seizure focus. If clinically indicated follow up prolonged video EEG monitoring should be considered. The absence of epileptiform activity does not rule out the diagnosis of seizures, which is a clinical diagnosis.    Dr. Jannett MARLA Maree, MD Board Certified in Neurology Board Certified in Clinical Neurophysiology [facility] [address] [place], [place], [place] Ph:   [phone] Fax: [phone]

## 2023-09-14 ENCOUNTER — Other Ambulatory Visit: Payer: Self-pay | Admitting: Cardiology

## 2023-09-14 DIAGNOSIS — R079 Chest pain, unspecified: Secondary | ICD-10-CM

## 2023-09-14 NOTE — Progress Notes (Signed)
 Orders only for PET stress consent.   Hamp Levine, PA-C

## 2023-09-15 ENCOUNTER — Ambulatory Visit
Admission: RE | Admit: 2023-09-15 | Discharge: 2023-09-15 | Disposition: A | Discharge status: Home / Self Care | Source: Ambulatory Visit | Attending: Student

## 2023-09-15 DIAGNOSIS — I2089 Other forms of angina pectoris: Secondary | ICD-10-CM

## 2023-09-15 DIAGNOSIS — R0602 Shortness of breath: Secondary | ICD-10-CM

## 2023-09-15 DIAGNOSIS — I25118 Atherosclerotic heart disease of native coronary artery with other forms of angina pectoris: Secondary | ICD-10-CM | POA: Insufficient documentation

## 2023-09-15 LAB — NM PET CT CARDIAC PERFUSION MULTI W/ABSOLUTE BLOODFLOW
MBFR: 1.75
Nuc Rest EF: 51 %
Nuc Stress EF: 43 %
Peak HR: 108 {beats}/min
Rest HR: 76 {beats}/min
Rest MBF: 0.8 ml/g/min
Rest Nuclear Isotope Dose: 18.7 mCi
SRS: 0
SSS: 6
ST Depression (mm): 0 mm
Stress MBF: 1.4 ml/g/min
Stress Nuclear Isotope Dose: 18.7 mCi
TID: 1.29

## 2023-09-15 MED ORDER — REGADENOSON 0.4 MG/5ML IV SOLN
INTRAVENOUS | Status: AC
  Filled 2023-09-15: qty 5

## 2023-09-15 MED ORDER — REGADENOSON 0.4 MG/5ML IV SOLN
0.4000 mg | Freq: Once | INTRAVENOUS | Status: AC
  Administered 2023-09-15: 0.4 mg via INTRAVENOUS
  Filled 2023-09-15: qty 5

## 2023-09-15 MED ORDER — RUBIDIUM RB82 GENERATOR (RUBYFILL)
25.0000 | PACK | Freq: Once | INTRAVENOUS | Status: AC
  Administered 2023-09-15: 18.68 via INTRAVENOUS

## 2023-09-15 MED ORDER — RUBIDIUM RB82 GENERATOR (RUBYFILL)
25.0000 | PACK | Freq: Once | INTRAVENOUS | Status: AC
  Administered 2023-09-15: 18.73 via INTRAVENOUS

## 2023-09-15 NOTE — Progress Notes (Signed)
 Patient presents for a cardiac PET stress test and tolerated procedure with some chest pressure that resolved by the conclusion of the study. Patient maintained acceptable vital signs throughout the test and was offered caffeine  after test.  Patient ambulated out of department with a steady gait.

## 2023-09-19 NOTE — Progress Notes (Signed)
 Established Patient Visit   Chief Complaint: Chief Complaint  Patient presents with  . Follow-up    PET scan review   Date of Service: 09/20/2023 Date of Birth: 1950/12/20 PCP: Bertrum Charlie Raring, MD  History of Present Illness: Ms. Connett is a 73 y.o.female patient who presents for a 6 month follow up. PMH significant for syncope, CAD, NSTEMI, a-fib, PSVT, hx of TIA, hypertension, hyperlipidemia, bilateral carotid artery stenosis, Obesity, type 2 diabetes, OSA.  Today, pt presents with recent intermittent chest pain. A few weeks ago, she felt tightness in her chest, felt nausea, and had some SOB along with the chest pain. Patient states that her chest pain has mostly resolved now and is not concerning to her. Recent PET scan was slightly abnormal but is not currently a cardiac concern. No changes made today.    Visit Summaries: 07/18/2023 Patient was seen by me for a follow up. Stop aspirin .  Past Medical and Surgical History  Past Medical History Past Medical History:  Diagnosis Date  . Colon polyp 10/23/2013   tubular adenoma  . Coronary artery disease   . COVID-19 06/2020  . Diabetes mellitus type 2, uncomplicated (CMS/HHS-HCC)   . Dyspepsia   . Encounter for blood transfusion 1970s  . Heart disease   . Hyperlipidemia   . Hypertension   . Myocardial infarction (CMS/HHS-HCC)   . Peripheral vascular disease ()   . Syncope and collapse   . TIA (transient ischemic attack) 10/28/2017    Past Surgical History She has a past surgical history that includes Appendectomy; egd (07/16/2013); Colonoscopy (10/23/13); Cesarean section; Tubal ligation; egd (06/24/2009, 09/22/2011, 07/16/2013); Coronary artery bypass graft; biopsy of temperal; and Coronary angioplasty.   Medications and Allergies  Current Medications  Current Outpatient Medications  Medication Sig Dispense Refill  . acetaminophen  (TYLENOL ) 500 MG tablet Take 2 tablets by mouth every 8 (eight) hours as needed     . apixaban  (ELIQUIS ) 5 mg tablet Take 1 tablet (5 mg total) by mouth 2 (two) times daily 180 tablet 3  . atorvastatin  (LIPITOR ) 80 MG tablet Take 1 tablet (80 mg total) by mouth once daily 90 tablet 3  . carbidopa -levodopa  (SINEMET ) 10-100 mg tablet Take 1 tablet by mouth 3 (three) times daily 90 tablet 11  . dulaglutide  (TRULICITY ) 1.5 mg/0.5 mL subcutaneous pen injector Inject 1.5 mg subcutaneously once a week    . empagliflozin  (JARDIANCE ) 10 mg tablet Take 1 tablet (10 mg total) by mouth once daily 90 tablet 3  . ezetimibe  (ZETIA ) 10 mg tablet Take 1 tablet (10 mg total) by mouth once daily 90 tablet 1  . GLUCOSE BLOOD test strip Use to check blood sugars daily as instructed    . isosorbide  mononitrate (IMDUR ) 120 MG ER tablet Take 1 tablet (120 mg total) by mouth once daily 90 tablet 3  . LANTUS  SOLOSTAR U-100 INSULIN  pen injector (concentration 100 units/mL) INJECT 100 UNITS SUBCUTANEOUSLY AT BEDTIME 15 mL 10  . loperamide  (IMODIUM ) 2 mg capsule Take 1 capsule by mouth 4 (four) times daily as needed    . midodrine  (PROAMATINE ) 2.5 MG tablet TAKE 1 TABLET BY MOUTH TWICE A DAY 180 tablet 3  . nitroGLYcerin  (NITROSTAT ) 0.4 MG SL tablet place ONE tablet UNDER THE toungue every FIVE mintues AS NEEDED FOR CHEST pain. may take up to 3 doses 25 tablet 1  . ondansetron  (ZOFRAN -ODT) 8 MG disintegrating tablet TAKE 1 TABLET BY MOUTH EVERY 8 HOURS AS NEEDED 18 tablet 3  . pantoprazole  (PROTONIX )  20 MG DR tablet Take 1 tablet (20 mg total) by mouth 2 (two) times daily 180 tablet 3  . pen needle, diabetic (SURE COMFORT PEN NEEDLE) 32 gauge x 1/4 needle USE AS DIRECTED 100 each 10  . propranoloL  (INDERAL  LA) 60 MG LA capsule Take 1 capsule (60 mg total) by mouth once daily 30 capsule 11  . ranolazine  (RANEXA ) 1,000 mg ER tablet TAKE ONE (1) TABLET BY MOUTH TWICE DAILY 90 tablet 3  . sertraline  (ZOLOFT ) 100 MG tablet Take 1 tablet (100 mg total) by mouth once daily 90 tablet 1  . sucralfate  (CARAFATE ) 1  gram tablet Take 1 tablet (1 g total) by mouth 4 (four) times daily before meals and nightly 120 tablet 11  . TRULICITY  1.5 mg/0.5 mL subcutaneous pen injector INJECT 0.5ML (1.5MG ) SUBCUTANEOUSLY ONCE WEEKLY 3 mL 11   No current facility-administered medications for this visit.    Allergies: Tramadol , Lisinopril , and Penicillin  Social and Family History  Social History  reports that she quit smoking about 22 years ago. Her smoking use included cigarettes. She has never used smokeless tobacco. She reports that she does not drink alcohol and does not use drugs.  Family History Family History  Problem Relation Name Age of Onset  . Pancreatic cancer Mother    . Lung cancer Father    . High blood pressure (Hypertension) Sister    . Breast cancer Sister    . Colon cancer Brother      Review of Systems   Pertinent positives and negatives are mentioned above in HPI and all other systems are negative.  Physical Examination   Vitals:BP 124/82 (BP Location: Left upper arm, Patient Position: Sitting, BP Cuff Size: Adult)   Pulse 87   Resp 14   Ht 157.5 cm (5' 2)   Wt 70.7 kg (155 lb 12.8 oz)   SpO2 98%   BMI 28.50 kg/m  Ht:157.5 cm (5' 2) Wt:70.7 kg (155 lb 12.8 oz) ADJ:Anib surface area is 1.76 meters squared. Body mass index is 28.5 kg/m.  HEENT: Pupils equally reactive to light and accomodation  Neck: Supple without thyromegaly, carotid pulses 2+ Lungs: clear to auscultation bilaterally; no wheezes, rales, rhonchi Heart: Regular rate and rhythm.  No gallops, murmurs or rub Abdomen: soft nontender, nondistended, with normal bowel sounds Extremities: no cyanosis, clubbing, or edema Peripheral Pulses: 2+ in all extremities, 2+ femoral pulses bilaterally Neurologic: Alert and oriented X3; speech intact; face symmetrical; moves all extremities well  Cardiovascular Studies:    Echocardiogram 2D complete: 06/30/2022 FINDINGS   Left Ventricle: Left ventricular ejection  fraction, by estimation, is 65 to 70%. The left ventricle has normal function. The left ventricle has no regional wall motion abnormalities. Definity  contrast agent was given IV to delineate the left ventricular   endocardial borders. The left ventricular internal cavity size was normal in size. There is mild concentric left ventricular hypertrophy. Left ventricular diastolic parameters are consistent with Grade I diastolic dysfunction (impaired relaxation).   Right Ventricle: The right ventricular size is mildly enlarged. Mildly increased right ventricular wall thickness. Right ventricular systolic function is mildly reduced.   Left Atrium: Left atrial size was mildly dilated.   Right Atrium: Right atrial size was mildly dilated.   Pericardium: There is no evidence of pericardial effusion.   Mitral Valve: The mitral valve is grossly normal. Trivial mitral valve regurgitation.   Tricuspid Valve: The tricuspid valve is normal in structure. Tricuspid valve regurgitation is mild.   Aortic Valve:  The aortic valve is normal in structure. Aortic valve regurgitation is trivial. Aortic valve sclerosis is present, with no evidence of aortic valve stenosis.   Pulmonic Valve: The pulmonic valve was normal in structure. Pulmonic valve regurgitation is not visualized.   Aorta: The ascending aorta was not well visualized.   IAS/Shunts: No atrial level shunt detected by color flow Doppler.    NM Myocardial Perfusion SPECT multiple (stress and rest): 08/29/2020 FINDINGS: Regional wall motion:  reveals normal myocardial thickening and wall motion. The overall quality of the study is good.   Artifacts noted: no Left ventricular cavity: normal.   Perfusion Analysis:  SPECT images demonstrate homogeneous tracer distribution throughout the myocardium. Defect type:  Normal   IMPRESSION: Indeterminant treadmill EKG due to baseline EKG changes Normal myocardial perfusion without evidence of myocardial  ischemia  Cardiac Catheterization:  05/11/2017 Scan on file   Holter:  Cardiac CT Scan:  Cardiac MRI:   Assessment   73 y.o. female with  1. Coronary artery disease of native artery of native heart with stable angina pectoris ()   2. H/O non-ST elevation myocardial infarction (NSTEMI)   3. H/O TIA (transient ischemic attack) and stroke   4. History of coronary artery bypass graft   5. Acute on chronic diastolic CHF (congestive heart failure), NYHA class 3 (CMS/HHS-HCC)   6. Paroxysmal A-fib (CMS/HHS-HCC)   7. Mild obstructive sleep apnea   8. Benign essential hypertension   9. Mixed hyperlipidemia   10. Orthostatic hypotension   11. Bilateral carotid artery stenosis   12. SOBOE (shortness of breath on exertion)   13. Stable angina ()   14. Type 2 diabetes mellitus without complication, unspecified whether long term insulin  use (CMS/HHS-HCC)   15. Overweight   16. Gastroesophageal reflux disease without esophagitis    Plan   CAD, h/o NSTEMI, S/P CABG, S/P PCI stent, stable, continue Zetia , metoprolol , entresto , nitroglycerin  as needed  CHF, continue entresto , metoprolol  PAF, with RVR, continue Eliquis , metoprolol  OSA, recommend sleep study, CPAP if indicated, and weight loss Hypertension, today's BP was 124/82, reasonably controlled, continue metoprolol  and entresto   Hyperlipidemia, continue Zetia  and rosuvastatin  for lipid management  Bilateral carotid artery stenosis, management per vascular   Diabetes, continue Trulicity  and jardiance , management per PCP  Overweight, recommend weight loss, exercise, and portion control GERD, continue pantoprazole  therapy for reflux type symptoms    Return in about 6 months (around 03/21/2024).  This note is partially written by Leita Ellen, in the presence of and acting as the scribe of Dr. Cara Lovelace.      Leita Ellen  I have reviewed, edited and added to the note to reflect my best personal medical  judgment.  Attestation Statement:   I personally performed the service. (TP)  DWAYNE JONETTA LOVELACE, MD  [facility] [place], [place] Ph:  [phone] Fax:  [phone] This note was generated in part with voice recognition software, Dragon.  I apologize for any typographical errors that were not detected and corrected from this process.  They are unintentional.

## 2023-10-23 ENCOUNTER — Observation Stay
Admission: EM | Admit: 2023-10-23 | Discharge: 2023-10-26 | Disposition: A | Discharge status: Home Health | Attending: Internal Medicine | Admitting: Internal Medicine

## 2023-10-23 ENCOUNTER — Emergency Department

## 2023-10-23 ENCOUNTER — Other Ambulatory Visit: Payer: Self-pay

## 2023-10-23 DIAGNOSIS — E119 Type 2 diabetes mellitus without complications: Secondary | ICD-10-CM | POA: Insufficient documentation

## 2023-10-23 DIAGNOSIS — Z6828 Body mass index (BMI) 28.0-28.9, adult: Secondary | ICD-10-CM | POA: Insufficient documentation

## 2023-10-23 DIAGNOSIS — R471 Dysarthria and anarthria: Principal | ICD-10-CM | POA: Insufficient documentation

## 2023-10-23 DIAGNOSIS — Z1152 Encounter for screening for COVID-19: Secondary | ICD-10-CM | POA: Diagnosis not present

## 2023-10-23 DIAGNOSIS — R079 Chest pain, unspecified: Secondary | ICD-10-CM | POA: Diagnosis present

## 2023-10-23 DIAGNOSIS — I5042 Chronic combined systolic (congestive) and diastolic (congestive) heart failure: Secondary | ICD-10-CM | POA: Insufficient documentation

## 2023-10-23 DIAGNOSIS — J449 Chronic obstructive pulmonary disease, unspecified: Secondary | ICD-10-CM | POA: Diagnosis not present

## 2023-10-23 DIAGNOSIS — R0789 Other chest pain: Secondary | ICD-10-CM | POA: Diagnosis present

## 2023-10-23 DIAGNOSIS — R1310 Dysphagia, unspecified: Secondary | ICD-10-CM | POA: Insufficient documentation

## 2023-10-23 DIAGNOSIS — I110 Hypertensive heart disease with heart failure: Secondary | ICD-10-CM | POA: Diagnosis not present

## 2023-10-23 DIAGNOSIS — I700 Atherosclerosis of aorta: Secondary | ICD-10-CM | POA: Insufficient documentation

## 2023-10-23 DIAGNOSIS — R55 Syncope and collapse: Secondary | ICD-10-CM | POA: Diagnosis not present

## 2023-10-23 DIAGNOSIS — G459 Transient cerebral ischemic attack, unspecified: Secondary | ICD-10-CM | POA: Diagnosis not present

## 2023-10-23 DIAGNOSIS — Z79899 Other long term (current) drug therapy: Secondary | ICD-10-CM | POA: Diagnosis not present

## 2023-10-23 DIAGNOSIS — I6503 Occlusion and stenosis of bilateral vertebral arteries: Secondary | ICD-10-CM

## 2023-10-23 DIAGNOSIS — I480 Paroxysmal atrial fibrillation: Secondary | ICD-10-CM | POA: Insufficient documentation

## 2023-10-23 LAB — CBC
HCT: 35.3 % — ABNORMAL LOW (ref 36.0–46.0)
Hemoglobin: 11.6 g/dL — ABNORMAL LOW (ref 12.0–15.0)
MCH: 33.1 pg (ref 26.0–34.0)
MCHC: 32.9 g/dL (ref 30.0–36.0)
MCV: 100.9 fL — ABNORMAL HIGH (ref 80.0–100.0)
Platelets: 182 K/uL (ref 150–400)
RBC: 3.5 MIL/uL — ABNORMAL LOW (ref 3.87–5.11)
RDW: 12.6 % (ref 11.5–15.5)
WBC: 6.4 K/uL (ref 4.0–10.5)
nRBC: 0 % (ref 0.0–0.2)

## 2023-10-23 LAB — URINE DRUG SCREEN, QUALITATIVE (ARMC ONLY)
Amphetamines, Ur Screen: NOT DETECTED
Barbiturates, Ur Screen: NOT DETECTED
Benzodiazepine, Ur Scrn: NOT DETECTED
Cannabinoid 50 Ng, Ur ~~LOC~~: NOT DETECTED
Cocaine Metabolite,Ur ~~LOC~~: NOT DETECTED
MDMA (Ecstasy)Ur Screen: NOT DETECTED
Methadone Scn, Ur: NOT DETECTED
Opiate, Ur Screen: NOT DETECTED
Phencyclidine (PCP) Ur S: NOT DETECTED
Tricyclic, Ur Screen: NOT DETECTED

## 2023-10-23 LAB — PROTIME-INR
INR: 1.5 — ABNORMAL HIGH (ref 0.8–1.2)
Prothrombin Time: 18.8 s — ABNORMAL HIGH (ref 11.4–15.2)

## 2023-10-23 LAB — COMPREHENSIVE METABOLIC PANEL WITH GFR
ALT: <5 U/L (ref 0–44)
AST: 17 U/L (ref 15–41)
Albumin: 3.4 g/dL — ABNORMAL LOW (ref 3.5–5.0)
Alkaline Phosphatase: 100 U/L (ref 38–126)
Anion gap: 10 (ref 5–15)
BUN: 11 mg/dL (ref 8–23)
CO2: 25 mmol/L (ref 22–32)
Calcium: 9 mg/dL (ref 8.9–10.3)
Chloride: 105 mmol/L (ref 98–111)
Creatinine, Ser: 0.89 mg/dL (ref 0.44–1.00)
GFR, Estimated: >60 mL/min (ref >60)
Glucose, Bld: 107 mg/dL — ABNORMAL HIGH (ref 70–99)
Potassium: 3.9 mmol/L (ref 3.5–5.1)
Sodium: 140 mmol/L (ref 135–145)
Total Bilirubin: 0.8 mg/dL (ref 0.0–1.2)
Total Protein: 6.7 g/dL (ref 6.5–8.1)

## 2023-10-23 LAB — DIFFERENTIAL
Abs Immature Granulocytes: 0.01 K/uL (ref 0.00–0.07)
Basophils Absolute: 0 K/uL (ref 0.0–0.1)
Basophils Relative: 0 %
Eosinophils Absolute: 0.1 K/uL (ref 0.0–0.5)
Eosinophils Relative: 1 %
Immature Granulocytes: 0 %
Lymphocytes Relative: 35 %
Lymphs Abs: 2.2 K/uL (ref 0.7–4.0)
Monocytes Absolute: 0.5 K/uL (ref 0.1–1.0)
Monocytes Relative: 8 %
Neutro Abs: 3.5 K/uL (ref 1.7–7.7)
Neutrophils Relative %: 56 %

## 2023-10-23 LAB — GLUCOSE, CAPILLARY
Glucose-Capillary: 78 mg/dL (ref 70–99)
Glucose-Capillary: 189 mg/dL — ABNORMAL HIGH (ref 70–99)
Glucose-Capillary: 107 mg/dL — ABNORMAL HIGH (ref 70–99)
Glucose-Capillary: 81 mg/dL (ref 70–99)

## 2023-10-23 LAB — APTT: aPTT: 35 s (ref 24–36)

## 2023-10-23 LAB — HEMOGLOBIN A1C
Hgb A1c MFr Bld: 6.5 % — ABNORMAL HIGH (ref 4.8–5.6)
Mean Plasma Glucose: 139.85 mg/dL

## 2023-10-23 LAB — TROPONIN I (HIGH SENSITIVITY)
Troponin I (High Sensitivity): 6 ng/L (ref <18)
Troponin I (High Sensitivity): 5 ng/L (ref <18)

## 2023-10-23 LAB — CBG MONITORING, ED: Glucose-Capillary: 80 mg/dL (ref 70–99)

## 2023-10-23 LAB — ETHANOL: Alcohol, Ethyl (B): <15 mg/dL (ref <15)

## 2023-10-23 MED ORDER — DEXTROSE-SODIUM CHLORIDE 5-0.9 % IV SOLN: INTRAVENOUS | Status: AC

## 2023-10-23 MED ORDER — ATORVASTATIN CALCIUM 20 MG PO TABS: 80.0000 mg | ORAL_TABLET | Freq: Every day | ORAL | Status: DC

## 2023-10-23 MED ORDER — RANOLAZINE ER 500 MG PO TB12
1000.0000 mg | ORAL_TABLET | Freq: Two times a day (BID) | ORAL | Status: DC
  Administered 2023-10-24 – 2023-10-26 (×5): 1000 mg via ORAL
  Filled 2023-10-23 (×5): qty 2

## 2023-10-23 MED ORDER — ATORVASTATIN CALCIUM 20 MG PO TABS
80.0000 mg | ORAL_TABLET | Freq: Every day | ORAL | Status: DC
  Administered 2023-10-24 – 2023-10-26 (×3): 80 mg via ORAL
  Filled 2023-10-23 (×3): qty 4

## 2023-10-23 MED ORDER — BUTALBITAL-APAP-CAFFEINE 50-325-40 MG PO TABS
1.0000 | ORAL_TABLET | Freq: Four times a day (QID) | ORAL | Status: DC | PRN
  Administered 2023-10-24 – 2023-10-25 (×3): 1 via ORAL
  Filled 2023-10-23 (×4): qty 1

## 2023-10-23 MED ORDER — INSULIN ASPART 100 UNIT/ML IJ SOLN
0.0000 [IU] | Freq: Three times a day (TID) | INTRAMUSCULAR | Status: DC
  Administered 2023-10-24: 3 [IU] via SUBCUTANEOUS
  Administered 2023-10-25 (×2): 2 [IU] via SUBCUTANEOUS
  Filled 2023-10-23 (×3): qty 1

## 2023-10-23 MED ORDER — ONDANSETRON HCL 4 MG/2ML IJ SOLN
4.0000 mg | Freq: Four times a day (QID) | INTRAMUSCULAR | Status: DC | PRN
  Administered 2023-10-23: 4 mg via INTRAVENOUS
  Filled 2023-10-23 (×2): qty 2

## 2023-10-23 MED ORDER — PANTOPRAZOLE SODIUM 20 MG PO TBEC
20.0000 mg | DELAYED_RELEASE_TABLET | Freq: Two times a day (BID) | ORAL | Status: DC
  Administered 2023-10-24 – 2023-10-26 (×5): 20 mg via ORAL
  Filled 2023-10-23 (×5): qty 1

## 2023-10-23 MED ORDER — PROPRANOLOL HCL ER 60 MG PO CP24
60.0000 mg | ORAL_CAPSULE | Freq: Every day | ORAL | Status: DC
Start: 2023-10-24 — End: 2023-10-26
  Administered 2023-10-24 – 2023-10-25 (×2): 60 mg via ORAL
  Filled 2023-10-23 (×3): qty 1

## 2023-10-23 MED ORDER — EZETIMIBE 10 MG PO TABS
10.0000 mg | ORAL_TABLET | Freq: Every day | ORAL | Status: DC
  Administered 2023-10-24 – 2023-10-26 (×3): 10 mg via ORAL
  Filled 2023-10-23 (×3): qty 1

## 2023-10-23 MED ORDER — SUCRALFATE 1 G PO TABS
1.0000 g | ORAL_TABLET | Freq: Four times a day (QID) | ORAL | Status: DC
  Administered 2023-10-24 – 2023-10-26 (×9): 1 g via ORAL
  Filled 2023-10-23 (×9): qty 1

## 2023-10-23 MED ORDER — DEXTROSE 50 % IV SOLN
1.0000 | Freq: Once | INTRAVENOUS | Status: AC
  Administered 2023-10-23: 50 mL via INTRAVENOUS
  Filled 2023-10-23: qty 50

## 2023-10-23 MED ORDER — EZETIMIBE 10 MG PO TABS
10.0000 mg | ORAL_TABLET | Freq: Every day | ORAL | Status: DC
  Filled 2023-10-23: qty 1

## 2023-10-23 MED ORDER — CARBIDOPA-LEVODOPA 10-100 MG PO TABS
1.0000 | ORAL_TABLET | Freq: Three times a day (TID) | ORAL | Status: DC
  Filled 2023-10-23: qty 1

## 2023-10-23 MED ORDER — APIXABAN 5 MG PO TABS
5.0000 mg | ORAL_TABLET | Freq: Two times a day (BID) | ORAL | Status: DC
  Administered 2023-10-24 – 2023-10-26 (×5): 5 mg via ORAL
  Filled 2023-10-23 (×5): qty 1

## 2023-10-23 MED ORDER — MIDODRINE HCL 5 MG PO TABS
2.5000 mg | ORAL_TABLET | Freq: Two times a day (BID) | ORAL | Status: DC
  Administered 2023-10-24 – 2023-10-26 (×5): 2.5 mg via ORAL
  Filled 2023-10-23 (×6): qty 1

## 2023-10-23 MED ORDER — CARBIDOPA-LEVODOPA 10-100 MG PO TABS
1.0000 | ORAL_TABLET | Freq: Three times a day (TID) | ORAL | Status: DC
  Administered 2023-10-24 – 2023-10-26 (×7): 1 via ORAL
  Filled 2023-10-23 (×8): qty 1

## 2023-10-23 MED ORDER — SODIUM CHLORIDE 0.9 % IV BOLUS
500.0000 mL | Freq: Once | INTRAVENOUS | Status: AC
  Administered 2023-10-23: 500 mL via INTRAVENOUS

## 2023-10-23 MED ORDER — MORPHINE SULFATE (PF) 2 MG/ML IV SOLN
2.0000 mg | Freq: Once | INTRAVENOUS | Status: AC
  Administered 2023-10-23: 2 mg via INTRAVENOUS
  Filled 2023-10-23: qty 1

## 2023-10-23 MED ORDER — HYDRALAZINE HCL 20 MG/ML IJ SOLN: 5.0000 mg | Freq: Four times a day (QID) | INTRAMUSCULAR | Status: DC | PRN

## 2023-10-23 MED ORDER — APIXABAN 5 MG PO TABS: 5.0000 mg | ORAL_TABLET | Freq: Two times a day (BID) | ORAL | Status: DC

## 2023-10-23 MED ORDER — MORPHINE SULFATE (PF) 2 MG/ML IV SOLN
2.0000 mg | INTRAVENOUS | Status: DC | PRN
  Administered 2023-10-23 – 2023-10-26 (×2): 2 mg via INTRAVENOUS
  Filled 2023-10-23 (×2): qty 1

## 2023-10-23 MED ORDER — PROPRANOLOL HCL ER 60 MG PO CP24
60.0000 mg | ORAL_CAPSULE | Freq: Every day | ORAL | Status: DC
  Filled 2023-10-23: qty 1

## 2023-10-23 MED ORDER — STROKE: EARLY STAGES OF RECOVERY BOOK: Freq: Once | Status: AC

## 2023-10-23 MED ORDER — SUCRALFATE 1 G PO TABS: 1.0000 g | ORAL_TABLET | Freq: Four times a day (QID) | ORAL | Status: DC

## 2023-10-23 MED ORDER — SERTRALINE HCL 50 MG PO TABS: 100.0000 mg | ORAL_TABLET | Freq: Every day | ORAL | Status: DC

## 2023-10-23 MED ORDER — PANTOPRAZOLE SODIUM 20 MG PO TBEC: 20.0000 mg | DELAYED_RELEASE_TABLET | Freq: Two times a day (BID) | ORAL | Status: DC

## 2023-10-23 MED ORDER — SODIUM CHLORIDE 0.9 % IV SOLN: INTRAVENOUS | Status: DC

## 2023-10-23 MED ORDER — IOHEXOL 350 MG/ML SOLN
75.0000 mL | Freq: Once | INTRAVENOUS | Status: AC | PRN
  Administered 2023-10-23: 75 mL via INTRAVENOUS

## 2023-10-23 MED ORDER — ACETAMINOPHEN 325 MG PO TABS: 650.0000 mg | ORAL_TABLET | Freq: Four times a day (QID) | ORAL | Status: DC | PRN

## 2023-10-23 MED ORDER — SERTRALINE HCL 50 MG PO TABS
100.0000 mg | ORAL_TABLET | Freq: Every day | ORAL | Status: DC
  Administered 2023-10-24 – 2023-10-25 (×2): 100 mg via ORAL
  Filled 2023-10-23 (×2): qty 2

## 2023-10-23 MED ORDER — RANOLAZINE ER 500 MG PO TB12: 1000.0000 mg | ORAL_TABLET | Freq: Two times a day (BID) | ORAL | Status: DC

## 2023-10-23 NOTE — ED Provider Notes (Signed)
 [facility] Provider Note    Event Date/Time   First MD Initiated Contact with Patient 10/23/23 1158     (approximate)   History   Headache (C) and Chest Pain   HPI  Candace [name] is a 73 y.o. female  significant of vertigo, A-fib on Eliquis , hypertension, hyperlipidemia, diabetes mellitus, COPD, CAD, CABG, diastolic CHF, stroke/TIA, OSA on CPAP      Patient reports that she went to church feeling well.  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  She reports she has had this happen before when she had a TIA but all the symptoms got better.  She reports her speech was very normal going to church.  She does not have any weakness in arm or leg but just feels tired.  No facial droop.  Her vision is back to normal.  She also reports she started having a sense of chest discomfort not severe but a light pressure sensation in her chest.  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 when she started having the feeling of dizziness slurring of speech and then later developing a sense of chest pressure  Physical Exam   Triage Vital Signs: ED Triage Vitals  Encounter Vitals Group     BP 10/23/23 1200 (!) 101/51     Girls Systolic BP Percentile --      Girls Diastolic BP Percentile --      Boys Systolic BP Percentile --      Boys Diastolic BP Percentile --      Pulse Rate 10/23/23 1200 77     Resp 10/23/23 1200 12     Temp 10/23/23 1200 99 F (37.2 C)     Temp Source 10/23/23 1200 Oral     SpO2 10/23/23 1200 97 %     Weight 10/23/23 1157 155 lb (70.3  kg)     Height 10/23/23 1157 5' 2 (1.575 m)     Head Circumference --      Peak Flow --      Pain Score 10/23/23 1157 6     Pain Loc --      Pain Education --      Exclude from Growth Chart --     Most recent vital signs: Vitals:   10/23/23 1230 10/23/23 1300  BP: (!) 122/59 116/61  Pulse: 76 76  Resp: 13 10  Temp:    SpO2: 98% 98%     General: Awake, no distress.  She is pleasant. Patient has no aphasia but has mild dysarthria.  Extraocular movements are normal.  Normal orientation.  No pronator drift in extremity.  Moves all extremities equally.  Extraocular movements are normal.  No focal weakness, thus VAN negative for high risk of LVO by exam.  CV:  Good peripheral perfusion.  Normal tones and rate Resp:  Normal effort.  Clear bilateral Abd:  No distention.  Nontender nondistended Other:  Well-perfused extremity   ED Results / Procedures / Treatments   Labs (all labs ordered are listed, but only abnormal results  are displayed) Labs Reviewed  PROTIME-INR - Abnormal; Notable for the following components:      Result Value   Prothrombin Time 18.8 (*)    INR 1.5 (*)    All other components within normal limits  CBC - Abnormal; Notable for the following components:   RBC 3.50 (*)    Hemoglobin 11.6 (*)    HCT 35.3 (*)    MCV 100.9 (*)    All other components within normal limits  COMPREHENSIVE METABOLIC PANEL WITH GFR - Abnormal; Notable for the following components:   Glucose, Bld 107 (*)    Albumin  3.4 (*)    All other components within normal limits  APTT  DIFFERENTIAL  URINE DRUG SCREEN, QUALITATIVE ([facility] ONLY)  ETHANOL  HEMOGLOBIN A1C  TROPONIN I (HIGH SENSITIVITY)  TROPONIN I (HIGH SENSITIVITY)     EKG  interpreted by me at noon heart rate 75 QRS 140 QTc 500 Normal sinus rhythm, left bundle branch block.  Compared with previous EKGs left bundle branch block is pre-existing   RADIOLOGY  MR BRAIN WO CONTRAST Result Date: 10/23/2023 CLINICAL  DATA:  Stroke, follow up.  Headache. EXAM: MRI HEAD WITHOUT CONTRAST TECHNIQUE: Multiplanar, multiecho pulse sequences of the brain and surrounding structures were obtained without intravenous contrast. COMPARISON:  Head CT and CTA 10/23/2023 and MRI 07/28/2023 FINDINGS: Brain: There is no evidence of an acute infarct, intracranial hemorrhage, mass, midline shift, or extra-axial fluid collection. Cerebral volume is within normal limits for age. The ventricles are normal in size. Patchy T2 hyperintensities in the cerebral white matter are similar to the prior MRI and are nonspecific but compatible with moderate chronic small vessel ischemic disease. Chronic lacunar infarcts are again noted in the basal ganglia and deep white matter bilaterally, and there is also an unchanged small chronic cortical infarct in the medial right parietal lobe. Vascular: Major intracranial vascular flow voids are preserved. Skull and upper cervical spine: Unremarkable bone marrow signal. Sinuses/Orbits: Unremarkable orbits. Paranasal sinuses and mastoid air cells are clear. Other: None. IMPRESSION: 1. No acute intracranial abnormality. 2. Moderate chronic small vessel ischemic disease. Electronically Signed   By: Dasie Hamburg M.D.   On: 10/23/2023 15:18   CT ANGIO HEAD NECK W WO CM Result Date: 10/23/2023 CLINICAL DATA:  Neuro deficit, acute, stroke suspected.  Headache. EXAM: CT ANGIOGRAPHY HEAD AND NECK WITH AND WITHOUT CONTRAST TECHNIQUE: Multidetector CT imaging of the head and neck was performed using the standard protocol during bolus administration of intravenous contrast. Multiplanar CT image reconstructions and MIPs were obtained to evaluate the vascular anatomy. Carotid stenosis measurements (when applicable) are obtained utilizing NASCET criteria, using the distal internal carotid diameter as the denominator. RADIATION DOSE REDUCTION: This exam was performed according to the departmental dose-optimization program which  includes automated exposure control, adjustment of the mA and/or kV according to patient size and/or use of iterative reconstruction technique. CONTRAST:  75mL OMNIPAQUE  IOHEXOL  350 MG/ML SOLN COMPARISON:  CTA head and neck 01/01/2022 FINDINGS: CTA NECK FINDINGS Aortic arch: Standard branching with mild atherosclerosis. No significant stenosis of the arch vessel origins. Right carotid system: Patent with calcified plaque about the carotid bifurcation resulting in 55-60% stenosis of the proximal ICA, unchanged. Left carotid system: Patent with scattered calcified and soft plaque throughout the common carotid artery and about the carotid bifurcation without evidence of a significant stenosis. Vertebral arteries: Patent with the left being slightly dominant. Calcified plaque at both vertebral artery origins resulting in new moderate stenosis bilaterally. Skeleton: Advanced  cervical spondylosis. Absent maxillary dentition. Carious right mandibular molar. Other neck: No evidence of cervical lymphadenopathy or mass. Upper chest: No mass or consolidation in the included lung apices. Review of the MIP images confirms the above findings CTA HEAD FINDINGS Anterior circulation: The internal carotid arteries are patent from skull base to carotid termini with calcified plaque resulting in mild right and moderate left cavernous and mild bilateral supraclinoid stenoses, similar to the prior CTA. ACAs and MCAs are patent without evidence of a proximal branch occlusion or significant A1 or M1 stenosis. Branch vessel irregularity is again noted including an unchanged moderate distal left M2 branch vessel stenosis. No aneurysm is identified. Posterior circulation: The intracranial vertebral arteries are patent to the basilar with atherosclerosis in the left V4 segment not resulting in a flow limiting stenosis. The basilar artery is patent with a severe stenosis proximally which is stable to mildly progressive. There are large right  and diminutive or absent left posterior communicating arteries with hypoplasia of the right P1 segment. Both PCAs are patent with branch vessel irregularity but no evidence of a significant proximal stenosis. No aneurysm is identified. Venous sinuses: As permitted by contrast timing, patent. Anatomic variants: Fetal right PCA. Review of the MIP images confirms the above findings IMPRESSION: 1. No large vessel occlusion. 2. Unchanged 55-60% proximal right ICA stenosis. 3. New moderate bilateral vertebral artery origin stenoses. 4. Severe proximal basilar artery stenosis, stable to mildly progressed. 5. Unchanged moderate left and mild right intracranial ICA stenoses. 6.  Aortic Atherosclerosis (ICD10-I70.0). Electronically Signed   By: Dasie Hamburg M.D.   On: 10/23/2023 14:40   CT HEAD CODE STROKE WO CONTRAST Result Date: 10/23/2023 EXAM: CT HEAD WITHOUT 10/23/2023 12:21:24 PM TECHNIQUE: CT of the head was performed without the administration of intravenous contrast. Automated exposure control, iterative reconstruction, and/or weight based adjustment of the mA/kV was utilized to reduce the radiation dose to as low as reasonably achievable. COMPARISON: MR head without contrast 07/28/2023. CT head without contrast 01/10/2023. CLINICAL HISTORY: Neuro deficit, acute, stroke suspected. Code stroke DR Juanell [phone] FINDINGS: BRAIN AND VENTRICLES: No acute intracranial hemorrhage. No mass effect or midline shift. No extra-axial fluid collection. Gray-white differentiation is maintained. No hydrocephalus. Remote lacunar infarcts of the basal ganglia and corona radiata are stable. Periventricular white matter hypoattenuation is moderately advanced for age, stable. ORBITS: No acute abnormality. SINUSES AND MASTOIDS: No acute abnormality. SOFT TISSUES AND SKULL: No acute skull fracture. No acute soft tissue abnormality. VASCULATURE: Atherosclerotic calcifications are present in the cavernous carotid arteries  bilaterally and at the dural margin of both vertebral arteries. No hyperdense vessel is present. [place] stroke program early CT (ASPECT) score ----- Ganglionic (caudate, internal capsule, lentiform nucleus, insula, M1-M3): 7 Supraganglionic (M4-M6): 3 Total: 10 IMPRESSION: 1. No acute intracranial abnormality. 2. Stable remote lacunar infarcts of the basal ganglia and corona radiata. 3. Moderately advanced periventricular white matter hypoattenuation, stable. 4. Atherosclerotic calcifications in the cavernous carotid arteries bilaterally and at the dural margin of both vertebral arteries. No hyperdense vessel is present. Findings were communicated to Dr. Jerrie via the [facility] system at 12:30 pm. Electronically signed by: Lonni Necessary MD 10/23/2023 12:32 PM EDT RP Workstation: [id number]    CT head interpreted by me as negative for acute finding     PROCEDURES:  Critical Care performed:   Yes, see critical care procedure note(s)  CRITICAL CARE Performed by: Oneil Budge   Total critical care time: 30 minutes  Critical care time was exclusive of  separately billable procedures and treating other patients.  Critical care was necessary to treat or prevent imminent or life-threatening deterioration.  Critical care was time spent personally by me on the following activities: development of treatment plan with patient and/or surrogate as well as nursing, discussions with consultants, evaluation of patient's response to treatment, examination of patient, obtaining history from patient or surrogate, ordering and performing treatments and interventions, ordering and review of laboratory studies, ordering and review of radiographic studies, pulse oximetry and re-evaluation of patient's condition.   Procedures   MEDICATIONS ORDERED IN ED: Medications  sodium chloride  0.9 % bolus 500 mL (has no administration in time range)  atorvastatin  (LIPITOR ) tablet 80 mg (has no administration in time  range)  ezetimibe  (ZETIA ) tablet 10 mg (has no administration in time range)  midodrine  (PROAMATINE ) tablet 2.5 mg (has no administration in time range)  propranolol  ER (INDERAL  LA) 24 hr capsule 60 mg (has no administration in time range)  ranolazine  (RANEXA ) 12 hr tablet 1,000 mg (has no administration in time range)  sertraline  (ZOLOFT ) tablet 100 mg (has no administration in time range)  pantoprazole  (PROTONIX ) EC tablet 20 mg (has no administration in time range)  sucralfate  (CARAFATE ) tablet 1 g (has no administration in time range)  apixaban  (ELIQUIS ) tablet 5 mg (has no administration in time range)  carbidopa -levodopa  (SINEMET  IR) 10-100 MG per tablet immediate release 1 tablet (has no administration in time range)   stroke: early stages of recovery book (has no administration in time range)  0.9 %  sodium chloride  infusion (has no administration in time range)  insulin  aspart (novoLOG ) injection 0-15 Units (has no administration in time range)  hydrALAZINE  (APRESOLINE ) injection 5 mg (has no administration in time range)  butalbital -acetaminophen -caffeine  (FIORICET ) 50-325-40 MG per tablet 1 tablet (has no administration in time range)  iohexol  (OMNIPAQUE ) 350 MG/ML injection 75 mL (75 mLs Intravenous Contrast Given 10/23/23 1323)  morphine  (PF) 2 MG/ML injection 2 mg (2 mg Intravenous Given 10/23/23 1546)     IMPRESSION / MDM / ASSESSMENT AND PLAN / ED COURSE  I reviewed the triage vital signs and the nursing notes.                              Differential diagnosis includes, but is not limited to, possible vertebral syndrome, stroke, ischemia intracranial hemorrhage, migraine, etc.  Patient reports symptoms started with a headache went to church and then had difficulty speaking headache and later a sense of chest pressure.  Her exam however does not show obvious focality except for speech abnormality.  Seen evaluate part of code stroke process.  Not a thrombolytic candidate.   Recommendation from neurology to obtain CTA head and neck which has been ordered at this time.  Clinically her chest pain seems to be a late her presenting symptom without ripping tearing moving or back pain component.  Symptoms seem highly atypical for cause such as dissection especially in the setting of her symptoms starting with headache slurring of speech.  She has had previous evaluations with neurology.    Patient's presentation is most consistent with acute presentation with potential threat to life or bodily function.   The patient is on the cardiac monitor to evaluate for evidence of arrhythmia and/or significant heart rate changes.   Clinical Course as of 10/23/23 1603  Sun Oct 23, 2023  1232 Dr. Jerrie has seen and evaluated provide preliminary recommendation that the patient will  require admission.  She is not a thrombolytic candidate due to use of Eliquis  this morning for which I am in agreement as the patient also reported this to me.  There is concern for potential stroke or small hemorrhage and MRI brain. Also obtain CTA head/neck now.  [MQ]    Clinical Course User Index [MQ] Dicky Anes, MD   ----------------------------------------- 3:43 PM on 10/23/2023 -----------------------------------------  Patient reports mild ongoing headache and chest discomfort.  Patient agreeable with plan for admission along with her family including daughter the bedside.  Will give a dose of morphine  as she does report that this is providing good relief of similar pains in the past as well.  Given her recent evaluation with cardiology and review of notation, it seems reasonable to admit her for further cardiac evaluation as well as neurology assessment.     FINAL CLINICAL IMPRESSION(S) / ED DIAGNOSES   Final diagnoses:  Dysarthria  Moderate risk chest pain     Rx / DC Orders   ED Discharge Orders          Ordered    Ambulatory referral to Neuroradiology        10/23/23 1542              Note:  This document was prepared using Dragon voice recognition software and may include unintentional dictation errors.   Dicky Anes, MD 10/23/23 [phone]

## 2023-10-23 NOTE — ED Notes (Signed)
 Code stroke called at 12:11

## 2023-10-23 NOTE — Consult Note (Signed)
 NEUROLOGY CONSULT NOTE   Date of service: October 23, 2023 Patient Name: Candace [name] MRN:  [id number] DOB:  Apr 10, 1950 Chief Complaint: Headache, slurred speech, blurry vision, chest pain Requesting Provider: Dicky Anes, MD  History of Present Illness  Candace [name] is a 73 y.o. female with hx of atrial fibrillation on Eliquis , coronary artery disease (CABG 2017, recent cath with severe three-vessel disease), paroxysmal SVT, congestive heart failure, hypertension, hyperlipidemia, bilateral carotid artery stenosis, peripheral vascular disease, TIAs, recurrent syncope, obstructive sleep apnea, BMI 28.35, essential tremor with possible superimposed parkinsonism, occipital neuralgia and chronic tension headache, esophageal dysphagia  She reports she woke up in her usual state of health at 5 AM.  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  While at Sunday school she began to have blurred vision and slurred speech.  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  However they activated EMS and she was transported to the hospital for further evaluation although she did not want to come.  She denies light or sound sensitivity but does report some nausea.  She was given 3 tablets of nitroglycerin  as well as 4 baby aspirin  per EMS.  On arrival to the ED because of concern for slurred speech code stroke was activated hide I saw the patient emergently  Of note, she has had multiple presentations for severe dizziness/near syncope.  Last significant episode was 07/28/2023, at which time workup included negative MRI brain.  On outpatient neurology follow-up, felt to be possible TIA or MRI negative strokes however EEG was also obtained to assess for seizure activity which  was negative  Additionally on cardiology follow-up 08/25/2023 she expressed she was having chest discomfort, leg weakness, palpitations and exercise intolerance intermittently for a few days to week but she refused going to the ER as she feels that no etiology of her symptoms is ever found in the ER.  She elected for outpatient workup and PET/CT stress test was completed on 6/19, which revealed a defect in the apical lateral/anterior wall as well as mid anterolateral/inferior lateral wall concerning for multivessel obstructive coronary artery disease with severe coronary calcifications in the LAD, left circumflex and right coronary artery distributions.  Interpreted as high risk risk with area of ischemia along with abnormal 3 times daily and reduced stress LVEF (rest EF 51%, stress EF 43%).  On follow-up with cardiology 09/20/2023 they noted that this was a slightly abnormal study but not currently a cardiac concern (patient stated that her chest pain had mostly resolved and was no longer concerning to her) and therefore no changes were made  Daughter expressed concern that her cardiologist reported if she came to the hospital with cardiac issues again she will need a stent, though I am unable to corroborate this in the last cardiology note  LKW: 8 AM Modified rankin score: 1  IV Thrombolysis: No, last dose of Eliquis  this morning EVT: No, exam not consistent with LVO   NIHSS components Score: Comment  1a Level of Conscious 0[x]  1[]  2[]  3[]      1b LOC Questions 0[x]  1[]  2[]       1c LOC Commands 0[x]  1[]  2[]       2 Best Gaze 0[x]  1[]  2[]   3 Visual 0[x]  1[]  2[]  3[]      4 Facial Palsy 0[x]  1[]  2[]  3[]      5a Motor Arm - left 0[x]  1[]  2[]  3[]  4[]  UN[]    5b Motor Arm - Right 0[x]  1[]  2[]  3[]  4[]  UN[]    6a Motor Leg - Left 0[]  1[x]  2[]  3[]  4[]  UN[]    6b Motor Leg - Right 0[]  1[x]  2[]  3[]  4[]  UN[]    7 Limb Ataxia 0[]  1[]  2[x]  UN[]     Chronic   8 Sensory 0[x]  1[]  2[]  UN[]      9 Best Language  0[x]  1[]  2[]  3[]      10 Dysarthria 0[]  1[x]  2[]  UN[]      11 Extinct. and Inattention 0[x]  1[]  2[]      Improved to 0 during eval  TOTAL: 5       ROS  Comprehensive ROS performed and pertinent positives documented in HPI    Past History   Past Medical History:  Diagnosis Date   Allergy    Anemia    Anxiety    Arrhythmia    Arthritis    Atrial fibrillation (HCC)    CHF (congestive heart failure) (HCC)    COPD (chronic obstructive pulmonary disease) (HCC)    Coronary artery disease    Depression    Diabetes mellitus without complication (HCC)    Dyspnea    doe   Dysrhythmia    GERD (gastroesophageal reflux disease)    Headache    History of hiatal hernia    Hyperlipidemia    Hypertension    Myocardial infarction (HCC)    2016, 04/2017   Myocardial infarction with cardiac rehabilitation [facility])    MI 2016/ CABG 8/17    FINISHED CARDIAC REHAB 3 WEEKS AGO   Panic attack    Pneumonia    Reflux    Stroke (HCC) 2015   showed up on MRI; no weakness noted   TIA (transient ischemic attack)    Voice tremor     Past Surgical History:  Procedure Laterality Date   ABDOMINAL HYSTERECTOMY     APPENDECTOMY  1975   ARTERY BIOPSY Right 04/26/2016   Procedure: BIOPSY TEMPORAL ARTERY;  Surgeon: Deward Argue, MD;  Location: [facility] ORS;  Service: ENT;  Laterality: Right;   CARDIAC CATHETERIZATION N/A 11/06/2015   Procedure: Left Heart Cath and Coronary Angiography;  Surgeon: Wolm JINNY Rhyme, MD;  Location: [facility] INVASIVE CV LAB;  Service: Cardiovascular;  Laterality: N/A;   CESAREAN SECTION     COLONOSCOPY  2015   COLONOSCOPY WITH PROPOFOL  N/A 11/03/2018   Procedure: COLONOSCOPY WITH PROPOFOL ;  Surgeon: Janalyn Keene NOVAK, MD;  Location: [facility] ENDOSCOPY;  Service: Endoscopy;  Laterality: N/A;   CORONARY ANGIOPLASTY  04/2017   [facility]   CORONARY ARTERY BYPASS GRAFT N/A 11/10/2015   Procedure: CORONARY ARTERY BYPASS GRAFTING (CABG), ON PUMP, TIMES FOUR, USING LEFT INTERNAL  MAMMARY ARTERY, BILATERAL GREATER SAPHENOUS VEINS HARVESTED ENDOSCOPICALLY;  Surgeon: Dallas NOVAK Jude, MD;  Location: [facility] OR;  Service: Open Heart Surgery;  Laterality: N/A;  LIMA-LAD; SEQ SVG-OM1-OM2; SVG-PL   CORONARY STENT INTERVENTION N/A 08/05/2016   Procedure: Coronary Stent Intervention;  Surgeon: Ammon Blunt, MD;  Location: [facility] INVASIVE CV LAB;  Service: Cardiovascular;  Laterality: N/A;   ESOPHAGOGASTRODUODENOSCOPY (EGD) WITH PROPOFOL  N/A 11/03/2018   Procedure: ESOPHAGOGASTRODUODENOSCOPY (EGD) WITH PROPOFOL ;  Surgeon: Janalyn Keene NOVAK, MD;  Location: [facility] ENDOSCOPY;  Service: Endoscopy;  Laterality: N/A;   HYSTERECTOMY ABDOMINAL WITH SALPINGO-OOPHORECTOMY Bilateral 08/15/2017   Procedure: HYSTERECTOMY ABDOMINAL WITH  BILATERAL SALPINGO-OOPHORECTOMY;  Surgeon: Connell Davies, MD;  Location: [facility] ORS;  Service: Gynecology;  Laterality: Bilateral;   LEFT HEART CATH AND CORONARY ANGIOGRAPHY N/A 08/05/2016   Procedure: Left Heart Cath and Coronary Angiography;  Surgeon: Ammon Blunt, MD;  Location: [facility] INV[facility] CV LAB;  Service: Cardiovascular;  Laterality: N/A;   LEFT HEART CATH AND CORS/GRAFTS ANGIOGRAPHY N/A 09/19/2018   Procedure: LEFT HEART CATH AND CORS/GRAFTS ANGIOGRAPHY;  Surgeon: Hester Wolm PARAS, MD;  Location: [facility] CV LAB;  Service: Cardiovascular;  Laterality: N/A;   LEFT HEART CATH AND CORS/GRAFTS ANGIOGRAPHY N/A 08/30/2019   Procedure: LEFT HEART CATH AND CORS/GRAFTS ANGIOGRAPHY;  Surgeon: Bosie Vinie LABOR, MD;  Location: [facility] CV LAB;  Service: Cardiovascular;  Laterality: N/A;   OOPHORECTOMY     TEE WITHOUT CARDIOVERSION N/A 11/10/2015   Procedure: TRANSESOPHAGEAL ECHOCARDIOGRAM (TEE);  Surgeon: Dallas KATHEE Jude, MD;  [facility] OR;  Service: Open Heart Surgery;  Laterality: N/A;   TUBAL LIGATION      Family History: Family History  Problem Relation Age of Onset   Cancer Father    Hypertension Father    Heart disease Father    Asthma  Father    Cancer Mother    Hypertension Mother    Pancreatic cancer Mother 65   Cancer Sister    Breast cancer Sister 43   Breast cancer Sister 63   Lung cancer Brother    Pancreatic cancer Sister 25   Cancer Sister     Social History  reports that she quit smoking about 22 years ago. Her smoking use included cigarettes. She has never used smokeless tobacco. She reports that she does not drink alcohol and does not use drugs.  Allergies  Allergen Reactions   Tramadol  Other (See Comments)    Causes patient to be off balance and Mental Changes   Lisinopril  Cough   Penicillins Swelling, Rash and Other (See Comments)    Did it involve swelling of the face/tongue/throat, SOB, or low BP? Yes Did it involve sudden or severe rash/hives, skin peeling, or any reaction on the inside of your mouth or nose? Yes Did you need to seek medical attention at a hospital or doctor's office? Yes When did it last happen?      15 years If all above answers are "NO", may proceed with cephalosporin use.     Medications  No current facility-administered medications for this encounter.  Current Outpatient Medications:    acetaminophen  (TYLENOL ) 325 MG tablet, Take 650 mg by mouth every 6 (six) hours as needed for moderate pain or headache. , Disp: , Rfl:    albuterol  (VENTOLIN  HFA) 108 (90 Base) MCG/ACT inhaler, Inhale 2 puffs into the lungs every 6 (six) hours as needed for wheezing or shortness of breath. (Patient not taking: Reported on 07/29/2023), Disp: 8 g, Rfl: 2   atorvastatin  (LIPITOR ) 80 MG tablet, Take 1 tablet (80 mg total) by mouth daily., Disp: 30 tablet, Rfl: 0   Blood Glucose Monitoring Suppl (ONETOUCH VERIO) w/Device KIT, Use daily to check blood sugar, Disp: 1 kit, Rfl: 0   carbidopa -levodopa  (SINEMET  IR) 10-100 MG tablet, Take 1 tablet by mouth 3 (three) times daily., Disp: , Rfl:    COMFORT EZ PEN NEEDLES 32G X 4 MM MISC, Use to inject insulin  daily, Disp: 100 each, Rfl: 1   Dulaglutide   (TRULICITY ) 1.5 MG/0.5ML SOPN, Inject 1.5 mg into the skin once a week. Typically takes Weds, ok to administer 01/14/23 has not had it this  week, can resume weekly on Fridays after today's dose, Disp: , Rfl:    ELIQUIS  5 MG TABS tablet, Take 5 mg by mouth 2 (two) times daily., Disp: , Rfl:    ezetimibe  (ZETIA ) 10 MG tablet, Take 1 tablet (10 mg total) by mouth daily., Disp: 90 tablet, Rfl: 1   glucose blood test strip, Use to check blood sugars daily as instructed, Disp: 100 each, Rfl: 12   insulin  glargine (LANTUS  SOLOSTAR) 100 UNIT/ML Solostar Pen, Inject 12 Units into the skin at bedtime. (Patient taking differently: Inject 16 Units into the skin at bedtime.), Disp: 18 mL, Rfl: 3   isosorbide  mononitrate (IMDUR ) 120 MG 24 hr tablet, Take 1 tablet (120 mg total) by mouth daily., Disp: 30 tablet, Rfl: 0   JARDIANCE  10 MG TABS tablet, Take 25 mg by mouth every morning., Disp: , Rfl:    Lancets (ONETOUCH DELICA PLUS LANCET33G) MISC, USE UP TO 4 TIMES DAILY AS DIRECTED, Disp: 100 each, Rfl: 0   midodrine  (PROAMATINE ) 2.5 MG tablet, Take 2.5 mg by mouth 2 (two) times daily with a meal., Disp: , Rfl:    nitroGLYCERIN  (NITROSTAT ) 0.4 MG SL tablet, Place 1 tablet (0.4 mg total) under the tongue every 5 (five) minutes as needed for chest pain. (Patient not taking: Reported on 07/29/2023), Disp: 30 tablet, Rfl: 0   pantoprazole  (PROTONIX ) 20 MG tablet, Take 1 tablet (20 mg total) by mouth 2 (two) times daily., Disp: 60 tablet, Rfl: 11   propranolol  ER (INDERAL  LA) 60 MG 24 hr capsule, Take 60 mg by mouth daily., Disp: , Rfl:    ranolazine  (RANEXA ) 1000 MG SR tablet, Take 1 tablet (1,000 mg total) by mouth 2 (two) times daily., Disp: 60 tablet, Rfl: 0   sertraline  (ZOLOFT ) 100 MG tablet, Take 1 tablet (100 mg total) by mouth at bedtime., Disp: , Rfl:    sucralfate  (CARAFATE ) 1 g tablet, Take 1 g by mouth 4 (four) times daily., Disp: , Rfl:   Vitals   Vitals:   10/23/23 1157 10/23/23 1200 10/23/23 1230  BP:   (!) 101/51 (!) 122/59  Pulse:  77 76  Resp:  12 13  Temp:  99 F (37.2 C)   TempSrc:  Oral   SpO2:  97% 98%  Weight: 70.3 kg    Height: 5' 2 (1.575 m)      Body mass index is 28.35 kg/m.   Physical Exam   Constitutional: Appears well-developed and well-nourished.  Psych: Affect appropriate to situation, mildly anxious but cooperative Eyes: No scleral injection HENT: No oropharyngeal obstruction.  MSK: no joint deformities.  Cardiovascular: Normal rate and regular rhythm. Perfusing extremities well Respiratory: Effort normal, non-labored breathing GI: Soft.  No distension. There is no tenderness.  Skin: Warm dry and intact visible skin  Neurologic Examination   Mental Status: Patient is awake, alert, oriented to person, place, month, age, and situation. Patient is able to give a clear and coherent history. No signs of aphasia or neglect Cranial Nerves: II: Visual Fields are full. Pupils are equal, round, and reactive to light.   III,IV, VI: EOMI without ptosis or diploplia.  V: Facial sensation is symmetric to temperature VII: Facial movement is symmetric.  VIII: hearing is intact to voice X: Uvula elevates symmetrically XI: Shoulder shrug is symmetric. XII: tongue is midline without atrophy or fasciculations.  Motor: Good antigravity strength in bilateral upper extremities with bilateral pronation without drift.  Lower extremities slight drift Sensory: Sensation is symmetric to light  touch and temperature in the arms and legs. Cerebellar: Essential tremor of the head and bilateral upper extremities. Gait:  Deferred in acute setting     Labs/Imaging/Neurodiagnostic studies   CBC:  Recent Labs  Lab 11/03/23 1200  WBC 6.4  NEUTROABS 3.5  HGB 11.6*  HCT 35.3*  MCV 100.9*  PLT 182   Basic Metabolic Panel:  Lab Results  Component Value Date   NA 140 07/28/2023   K 3.9 07/28/2023   CO2 24 07/28/2023   GLUCOSE 143 (H) 07/28/2023   BUN 12 07/28/2023    CREATININE 0.77 07/28/2023   CALCIUM  7.7 (L) 07/28/2023   GFRNONAA >60 07/28/2023   GFRAA 108 02/06/2020   Lipid Panel:  Lab Results  Component Value Date   LDLCALC 83 01/11/2023   HgbA1c:  Lab Results  Component Value Date   HGBA1C 8.0 (H) 07/01/2022   Urine Drug Screen:     Component Value Date/Time   LABOPIA NONE DETECTED 01/10/2023 1620   COCAINSCRNUR NONE DETECTED 01/10/2023 1620   LABBENZ POSITIVE (A) 01/10/2023 1620   AMPHETMU NONE DETECTED 01/10/2023 1620   THCU NONE DETECTED 01/10/2023 1620   LABBARB NONE DETECTED 01/10/2023 1620    Alcohol Level     Component Value Date/Time   ETH <5 05/16/2016 1838   INR  Lab Results  Component Value Date   INR 1.5 (H) 11/03/23   APTT  Lab Results  Component Value Date   APTT 35 03-Nov-2023   AED levels: No results found for: PHENYTOIN, ZONISAMIDE, LAMOTRIGINE, LEVETIRACETA  CT Head without contrast(Personally reviewed): No acute intracranial process  CT angio Head and Neck with contrast(Personally reviewed): 1. No large vessel occlusion. 2. Unchanged 55-60% proximal right ICA stenosis. 3. New moderate bilateral vertebral artery origin stenoses. 4. Severe proximal basilar artery stenosis, stable to mildly progressed. 5. Unchanged moderate left and mild right intracranial ICA stenoses. 6.  Aortic Atherosclerosis (ICD10-I70.0).  MRI Brain(Personally reviewed): 1. No acute intracranial abnormality. 2. Moderate chronic small vessel ischemic disease.   ASSESSMENT   VERNEL DONLAN is a 73 y.o. female presenting with an episode of headache followed by blurry vision and slurred speech as well as chest pain.  Symptoms resolved by the time back in ED room after CT scans completed.  Possibly TIA versus complex headache.  Interpretation of blood pressure in ED complicated by the fact that she did receive 3 sublingual nitroglycerin  with EMS as well as 4 aspirin , so cannot, and if hypertensive urgency/emergency may  have been playing a role (no documentation of what blood pressure was with EMS prior to sublingual nitroglycerin  available to me)  RECOMMENDATIONS  - CTA head and neck - MRI brain without contrast - If MRI brain is negative and CTA is without new actionable stenosis, further workup/management can be completed outpatient from a neurological perspective, noting patient expresses strong desire to be discharged from the ED as quickly as possible - Chest pain workup per ED   Addendum:  Patient is having ongoing chest pain for which she will be admitted to the hospital. Certainly no acute findings on her MRI brain and CTA head and neck which I have personally reviewed, full reports above  However her bilateral vertebral artery stenosis could be leading to episodes of dizziness/blurred vision (vertebrobasilar insufficiency would be a consideration here especially given her tandem basilar stenosis).  Some data suggest vertebral artery stenting can be helpful in this situation when the stenosis is at the origin, however the first step would  be diagnostic angiogram, not emergently, after cardiac issues are stabilized, and only if symptoms are ongoing despite addressing cardiac issues  Will place ambulatory referral to Dr. Lester, neurointerventional radiology Inpatient neurology will sign off at this time, please do not hesitate to reach out if new questions/concerns arise   ______________________________________________________________________   Candace Jernigan MD-PhD [facility] [phone] [facility] coverage for [facility] is from 8 AM to 4 AM in-house and 4 PM to 8 PM by telephone/video. 8 PM to 8 AM emergent questions or overnight urgent questions should be addressed to Teleneurology On-call or Candace [name] neurohospitalist; contact information can be found on AMION  CRITICAL CARE Performed by: Candace [name]   Total critical care time: 40 minutes  Critical care time was  exclusive of separately billable procedures and treating other patients.  Critical care was necessary to treat or prevent imminent or life-threatening deterioration -- emergent evaluation for consideration of Eliquis  reversal, thrombolytic or thrombectomy  Critical care was time spent personally by me on the following activities: development of treatment plan with patient and/or surrogate as well as nursing, discussions with consultants, evaluation of patient's response to treatment, examination of patient, obtaining history from patient or surrogate, ordering and performing treatments and interventions, ordering and review of laboratory studies, ordering and review of radiographic studies, pulse oximetry and re-evaluation of patient's condition.

## 2023-10-23 NOTE — Progress Notes (Signed)
   10/23/23 1230  Spiritual Encounters  Type of Visit Initial  Care provided to: Pt and family  Conversation partners present during encounter Physician  Referral source Code page  Reason for visit Code  OnCall Visit Yes   Chaplain visited patient and family per a Code Stroke that came in to the [facility] phone.  Chaplain checked with daughter who was at bedside. Daughter shared that patient had been in a few times of late for care.  Chaplain offered a compassionate presence.  Once staff completed their assessment, Chaplain met patient and offered spiritual care.  Patient asked for prayer and Chaplain prayed with patient and family at bedside.  Patient and daughter had no other needs at this time.    Rev. Rana M. Nicholaus, M.Div. Chaplain Resident [facility]

## 2023-10-23 NOTE — Progress Notes (Signed)
 CODE STROKE- PHARMACY COMMUNICATION   Time CODE STROKE called/page received: 1213  Time response to CODE STROKE was made (in person or via phone): In person   Time Stroke Kit retrieved from [employer] (only if needed): TNK not given  Name of Provider/Nurse contacted: Dr. Jerrie  Past Medical History:  Diagnosis Date   Allergy    Anemia    Anxiety    Arrhythmia    Arthritis    Atrial fibrillation (HCC)    CHF (congestive heart failure) (HCC)    COPD (chronic obstructive pulmonary disease) (HCC)    Coronary artery disease    Depression    Diabetes mellitus without complication (HCC)    Dyspnea    doe   Dysrhythmia    GERD (gastroesophageal reflux disease)    Headache    History of hiatal hernia    Hyperlipidemia    Hypertension    Myocardial infarction (HCC)    2016, 04/2017   Myocardial infarction with cardiac rehabilitation [facility])    MI 2016/ CABG 8/17    FINISHED CARDIAC REHAB 3 WEEKS AGO   Panic attack    Pneumonia    Reflux    Stroke (HCC) 2015   showed up on MRI; no weakness noted   TIA (transient ischemic attack)    Voice tremor    Prior to Admission medications   Medication Sig Start Date End Date Taking? Authorizing Provider  acetaminophen  (TYLENOL ) 325 MG tablet Take 650 mg by mouth every 6 (six) hours as needed for moderate pain or headache.     [provider]  albuterol  (VENTOLIN  HFA) 108 (90 Base) MCG/ACT inhaler Inhale 2 puffs into the lungs every 6 (six) hours as needed for wheezing or shortness of breath. Patient not taking: Reported on 07/29/2023 01/07/21   Bertrum Charlie CROME, MD  atorvastatin  (LIPITOR ) 80 MG tablet Take 1 tablet (80 mg total) by mouth daily. 07/03/22   Alexander, Natalie, DO  Blood Glucose Monitoring Suppl [facility] VERIO) w/Device KIT Use daily to check blood sugar 03/05/21   Bacigalupo, Angela M, MD  carbidopa -levodopa  (SINEMET  IR) 10-100 MG tablet Take 1 tablet by mouth 3 (three) times daily. 01/17/23   Alexander, Natalie, DO   COMFORT EZ PEN NEEDLES 32G X 4 MM MISC Use to inject insulin  daily 01/04/22   Emilio Marseille T, FNP  Dulaglutide  (TRULICITY ) 1.5 MG/0.5ML SOPN Inject 1.5 mg into the skin once a week. Typically takes Weds, ok to administer 01/14/23 has not had it this week, can resume weekly on Fridays after today's dose 01/14/23   Alexander, Natalie, DO  ELIQUIS  5 MG TABS tablet Take 5 mg by mouth 2 (two) times daily. 03/26/20   [provider]  ezetimibe  (ZETIA ) 10 MG tablet Take 1 tablet (10 mg total) by mouth daily. 01/19/22   Cyndi Shaver, PA-C  glucose blood test strip Use to check blood sugars daily as instructed 03/02/22   Simmons-Robinson, Rockie, MD  insulin  glargine (LANTUS  SOLOSTAR) 100 UNIT/ML Solostar Pen Inject 12 Units into the skin at bedtime. Patient taking differently: Inject 16 Units into the skin at bedtime. 08/21/21   Jens Durand, MD  isosorbide  mononitrate (IMDUR ) 120 MG 24 hr tablet Take 1 tablet (120 mg total) by mouth daily. 01/15/23   Alexander, Natalie, DO  JARDIANCE  10 MG TABS tablet Take 25 mg by mouth every morning.    [provider]  Lancets [facility] DELICA PLUS LANCET33G) MISC USE UP TO 4 TIMES DAILY AS DIRECTED 07/10/19   Bertrum Charlie  L, MD  midodrine  (PROAMATINE ) 2.5 MG tablet Take 2.5 mg by mouth 2 (two) times daily with a meal. 02/22/23 02/22/24  [provider]  nitroGLYCERIN  (NITROSTAT ) 0.4 MG SL tablet Place 1 tablet (0.4 mg total) under the tongue every 5 (five) minutes as needed for chest pain. Patient not taking: Reported on 07/29/2023 07/27/16   Barbette Cea, MD  pantoprazole  (PROTONIX ) 20 MG tablet Take 1 tablet (20 mg total) by mouth 2 (two) times daily. 05/15/21   Bertrum Charlie CROME, MD  propranolol  ER (INDERAL  LA) 60 MG 24 hr capsule Take 60 mg by mouth daily. 07/15/23   [provider]  ranolazine  (RANEXA ) 1000 MG SR tablet Take 1 tablet (1,000 mg total) by mouth 2 (two) times daily. 07/02/22   Alexander, Natalie, DO  sertraline   (ZOLOFT ) 100 MG tablet Take 1 tablet (100 mg total) by mouth at bedtime. 07/29/23   Alexander, Natalie, DO  sucralfate  (CARAFATE ) 1 g tablet Take 1 g by mouth 4 (four) times daily. 04/27/23 04/26/24  [provider]  cetirizine  (ZYRTEC ) 5 MG tablet Take 2 tablets (10 mg total) by mouth daily. 12/19/18 02/06/20  Bertrum Charlie CROME, MD    Damien Napoleon ,PharmD Clinical Pharmacist  10/23/2023  12:29 PM

## 2023-10-23 NOTE — Consult Note (Signed)
 VASCULAR SURGERY CONSULTATION   Requested by:  Lola Jernigan, MD  Reason for consultation: vertebrobasilar disease    History of Present Illness   Candace [name] is a 73 y.o. (1950-05-09) female who presents with cc: neck and chest pain.  Pt has been told she has had TIA in the past.  She has onset this AM of bilateral vision blurring, severe RIGHT headache.  Reportedly she had slurred speech while at Sunday school.    Code Neuro was called while in the ED.  Pt currently notes persistent headache with any significant neuro deficits.  Blurred vision is resolved.  Reportedly pt also developed chest pain while present.   Past Medical History:  Diagnosis Date   Allergy    Anemia    Anxiety    Arrhythmia    Arthritis    Atrial fibrillation (HCC)    CHF (congestive heart failure) (HCC)    COPD (chronic obstructive pulmonary disease) (HCC)    Coronary artery disease    Depression    Diabetes mellitus without complication (HCC)    Dyspnea    doe   Dysrhythmia    GERD (gastroesophageal reflux disease)    Headache    History of hiatal hernia    Hyperlipidemia    Hypertension    Myocardial infarction (HCC)    2016, 04/2017   Myocardial infarction with cardiac rehabilitation [facility])    MI 2016/ CABG 8/17    FINISHED CARDIAC REHAB 3 WEEKS AGO   Panic attack    Pneumonia    Reflux    Stroke (HCC) 2015   showed up on MRI; no weakness noted   TIA (transient ischemic attack)    Voice tremor     Past Surgical History:  Procedure Laterality Date   ABDOMINAL HYSTERECTOMY     APPENDECTOMY  1975   ARTERY BIOPSY Right 04/26/2016   Procedure: BIOPSY TEMPORAL ARTERY;  Surgeon: Deward Argue, MD;  Location: [facility] ORS;  Service: ENT;  Laterality: Right;   CARDIAC CATHETERIZATION N/A 11/06/2015   Procedure: Left Heart Cath and Coronary Angiography;  Surgeon: Wolm JINNY Rhyme, MD;  Location: [facility] INVASIVE CV LAB;  Service: Cardiovascular;  Laterality: N/A;   CESAREAN SECTION      COLONOSCOPY  2015   COLONOSCOPY WITH PROPOFOL  N/A 11/03/2018   Procedure: COLONOSCOPY WITH PROPOFOL ;  Surgeon: Janalyn Keene NOVAK, MD;  Location: [facility] ENDOSCOPY;  Service: Endoscopy;  Laterality: N/A;   CORONARY ANGIOPLASTY  04/2017   [facility]   CORONARY ARTERY BYPASS GRAFT N/A 11/10/2015   Procedure: CORONARY ARTERY BYPASS GRAFTING (CABG), ON PUMP, TIMES FOUR, USING LEFT INTERNAL MAMMARY ARTERY, BILATERAL GREATER SAPHENOUS VEINS HARVESTED ENDOSCOPICALLY;  Surgeon: Dallas NOVAK Jude, MD;  Location: [facility] OR;  Service: Open Heart Surgery;  Laterality: N/A;  LIMA-LAD; SEQ SVG-OM1-OM2; SVG-PL   CORONARY STENT INTERVENTION N/A 08/05/2016   Procedure: Coronary Stent Intervention;  Surgeon: Ammon Blunt, MD;  Location: [facility] CV LAB;  Service: Cardiovascular;  Laterality: N/A;   ESOPHAGOGASTRODUODENOSCOPY (EGD) WITH PROPOFOL  N/A 11/03/2018   Procedure: ESOPHAGOGASTRODUODENOSCOPY (EGD) WITH PROPOFOL ;  Surgeon: Janalyn Keene NOVAK, MD;  Location: [facility] ENDOS[facility];  Service: Endoscopy;  Laterality: N/A;   HYSTERECTOMY ABDOMINAL WITH SALPINGO-OOPHORECTOMY Bilateral 08/15/2017   Procedure: HYSTERECTOMY ABDOMINAL WITH BILATERAL SALPINGO-OOPHORECTOMY;  Surgeon: Connell Davies, MD;  Location: [facility] ORS;  Service: Gynecology;  Laterality: Bilateral;   LEFT HEART CATH AND CORONARY ANGIOGRAPHY N/A 08/05/2016   Procedure: Left Heart Cath and Coronary Angiography;  Surgeon: Ammon Blunt,  MD;  Location: [facility];  Service: Cardiovascular;  Laterality: N/A;   LEFT HEART CATH AND CORS/GRAFTS ANGIOGRAPHY N/A 09/19/2018   Procedure: LEFT HEART CATH AND CORS/GRAFTS ANGIOGRAPHY;  Surgeon: Hester Wolm PARAS, MD;  Location: [facility];  Service: Cardiovascular;  Laterality: N/A;   LEFT HEART CATH AND CORS/GRAFTS ANGIOGRAPHY N/A 08/30/2019   Procedure: LEFT HEART CATH AND CORS/GRAFTS ANGIOGRAPHY;  Surgeon: Bosie Vinie LABOR, MD;  Location: [facility] LAB;  Service:  Cardiovascular;  Laterality: N/A;   OOPHORECTOMY     TEE WITHOUT CARDIOVERSION N/A 11/10/2015   Procedure: TRANSESOPHAGEAL ECHOCARDIOGRAM (TEE);  Surgeon: Dallas KATHEE Jude, MD;  Location: [facility] OR;  Service: Open Heart Surgery;  Laterality: N/A;   TUBAL LIGATION       Social History   Socioeconomic History   Marital status: Divorced    Spouse name: Not on file   Number of children: 3   Years of education: Not on file   Highest education level: Some college, no degree  Occupational History   Occupation: retired  Tobacco Use   Smoking status: Former    Current packs/day: 0.00    Types: Cigarettes    Quit date: 10/07/2001    Years since quitting: 22.0   Smokeless tobacco: Never  Vaping Use   Vaping status: Never Used  Substance and Sexual Activity   Alcohol use: No    Alcohol/week: 0.0 standard drinks of alcohol   Drug use: No   Sexual activity: Not Currently  Other Topics Concern   Not on file  Social History Narrative   Lives at home alone   Social Drivers of Health   Financial Resource Strain: Low Risk  (01/26/2023)   Received from [facility]   Overall Financial Resource Strain (CARDIA)    Difficulty of Paying Living Expenses: Not hard at all  Food Insecurity: No Food Insecurity (01/26/2023)   Received from [facility]   Hunger Vital Sign    Within the past 12 months, you worried that your food would run out before you got the money to buy more.: Never true    Within the past 12 months, the food you bought just didn't last and you didn't have money to get more.: Never true  Transportation Needs: No Transportation Needs (01/26/2023)   Received from [facility] - Transportation    In the past 12 months, has lack of transportation kept you from medical appointments or from getting medications?: No    Lack of Transportation (Non-Medical): No  Physical Activity: Inactive (01/26/2023)   Received from [facility]   Exercise Vital Sign    On average, how many days per week do you engage in moderate to strenuous exercise (like a brisk walk)?: 0 days    On average, how many minutes do you engage in exercise at this level?: 0 min  Stress: No Stress Concern Present (03/26/2022)   [employer] of Occupational Health - Occupational Stress Questionnaire    Feeling of Stress : Not at all  Social Connections: Moderately Integrated (03/26/2022)   Social Connection and Isolation Panel    Frequency of Communication with Friends and Family: More than three times a week    Frequency of Social Gatherings with Friends and Family: Twice a week    Attends Religious Services: More than 4 times per year    Active Member of [employer] or Organizations: Yes    Attends [employer]  or Organization Meetings: Never    Marital Status: Divorced  Catering manager Violence: Not At Risk (01/12/2023)   Humiliation, Afraid, Rape, and Kick questionnaire    Fear of Current or Ex-Partner: No    Emotionally Abused: No    Physically Abused: No    Sexually Abused: No    Family History  Problem Relation Age of Onset   Cancer Father    Hypertension Father    Heart disease Father    Asthma Father    Cancer Mother    Hypertension Mother    Pancreatic cancer Mother 47   Cancer Sister    Breast cancer Sister 33   Breast cancer Sister 17   Lung cancer Brother    Pancreatic cancer Sister 52   Cancer Sister     Current Facility-Administered Medications  Medication Dose Route Frequency Provider Last Rate Last Admin   [START ON 10/24/2023]  stroke: early stages of recovery book   Does not apply Once Laurita Manor T, MD       0.9 %  sodium chloride  infusion   Intravenous Continuous Laurita Manor DASEN, MD       apixaban  (ELIQUIS ) tablet 5 mg  5 mg Oral BID Laurita Manor DASEN, MD       atorvastatin  (LIPITOR ) tablet 80 mg  80 mg Oral Daily Zhang, Ping T, MD       butalbital -acetaminophen -caffeine  (FIORICET ) 50-325-40 MG per  tablet 1 tablet  1 tablet Oral Q6H PRN Laurita Manor DASEN, MD       carbidopa -levodopa  (SINEMET  IR) 10-100 MG per tablet immediate release 1 tablet  1 tablet Oral TID Laurita Manor DASEN, MD       ezetimibe  (ZETIA ) tablet 10 mg  10 mg Oral Daily Laurita Manor T, MD       hydrALAZINE  (APRESOLINE ) injection 5 mg  5 mg Intravenous Q6H PRN Laurita Manor DASEN, MD       insulin  aspart (novoLOG ) injection 0-15 Units  0-15 Units Subcutaneous TID WC Zhang, Ping T, MD       midodrine  (PROAMATINE ) tablet 2.5 mg  2.5 mg Oral BID WC Zhang, Ping T, MD       pantoprazole  (PROTONIX ) EC tablet 20 mg  20 mg Oral BID Zhang, Ping T, MD       propranolol  ER (INDERAL  LA) 24 hr capsule 60 mg  60 mg Oral Daily Laurita Manor T, MD       ranolazine  (RANEXA ) 12 hr tablet 1,000 mg  1,000 mg Oral BID Laurita Manor T, MD       sertraline  (ZOLOFT ) tablet 100 mg  100 mg Oral QHS Laurita Manor T, MD       sodium chloride  0.9 % bolus 500 mL  500 mL Intravenous Once Laurita Manor T, MD       sucralfate  (CARAFATE ) tablet 1 g  1 g Oral QID Laurita Manor DASEN, MD       Current Outpatient Medications  Medication Sig Dispense Refill   acetaminophen  (TYLENOL ) 325 MG tablet Take 650 mg by mouth every 6 (six) hours as needed for moderate pain or headache.      albuterol  (VENTOLIN  HFA) 108 (90 Base) MCG/ACT inhaler Inhale 2 puffs into the lungs every 6 (six) hours as needed for wheezing or shortness of breath. (Patient not taking: Reported on 07/29/2023) 8 g 2   atorvastatin  (LIPITOR ) 80 MG tablet Take 1 tablet (80 mg total) by mouth daily. 30 tablet 0   Blood Glucose Monitoring Suppl [facility]  VERIO) w/Device KIT Use daily to check blood sugar 1 kit 0   carbidopa -levodopa  (SINEMET  IR) 10-100 MG tablet Take 1 tablet by mouth 3 (three) times daily.     COMFORT EZ PEN NEEDLES 32G X 4 MM MISC Use to inject insulin  daily 100 each 1   Dulaglutide  (TRULICITY ) 1.5 MG/0.5ML SOPN Inject 1.5 mg into the skin once a week. Typically takes Weds, ok to administer 01/14/23 has not had  it this week, can resume weekly on Fridays after today's dose     ELIQUIS  5 MG TABS tablet Take 5 mg by mouth 2 (two) times daily.     ezetimibe  (ZETIA ) 10 MG tablet Take 1 tablet (10 mg total) by mouth daily. 90 tablet 1   glucose blood test strip Use to check blood sugars daily as instructed 100 each 12   insulin  glargine (LANTUS  SOLOSTAR) 100 UNIT/ML Solostar Pen Inject 12 Units into the skin at bedtime. (Patient taking differently: Inject 16 Units into the skin at bedtime.) 18 mL 3   isosorbide  mononitrate (IMDUR ) 120 MG 24 hr tablet Take 1 tablet (120 mg total) by mouth daily. 30 tablet 0   JARDIANCE  10 MG TABS tablet Take 25 mg by mouth every morning.     Lancets (ONETOUCH DELICA PLUS LANCET33G) MISC USE UP TO 4 TIMES DAILY AS DIRECTED 100 each 0   midodrine  (PROAMATINE ) 2.5 MG tablet Take 2.5 mg by mouth 2 (two) times daily with a meal.     nitroGLYCERIN  (NITROSTAT ) 0.4 MG SL tablet Place 1 tablet (0.4 mg total) under the tongue every 5 (five) minutes as needed for chest pain. (Patient not taking: Reported on 07/29/2023) 30 tablet 0   pantoprazole  (PROTONIX ) 20 MG tablet Take 1 tablet (20 mg total) by mouth 2 (two) times daily. 60 tablet 11   propranolol  ER (INDERAL  LA) 60 MG 24 hr capsule Take 60 mg by mouth daily.     ranolazine  (RANEXA ) 1000 MG SR tablet Take 1 tablet (1,000 mg total) by mouth 2 (two) times daily. 60 tablet 0   sertraline  (ZOLOFT ) 100 MG tablet Take 1 tablet (100 mg total) by mouth at bedtime.     sucralfate  (CARAFATE ) 1 g tablet Take 1 g by mouth 4 (four) times daily.      Allergies  Allergen Reactions   Tramadol  Other (See Comments)    Causes patient to be off balance and Mental Changes   Lisinopril  Cough   Penicillins Swelling, Rash and Other (See Comments)    Did it involve swelling of the face/tongue/throat, SOB, or low BP? Yes Did it involve sudden or severe rash/hives, skin peeling, or any reaction on the inside of your mouth or nose? Yes Did you need to seek  medical attention at a hospital or doctor's office? Yes When did it last happen?      15 years If all above answers are "NO", may proceed with cephalosporin use.     REVIEW OF SYSTEMS (negative unless checked):   Cardiac:  [x]  Chest pain or chest pressure? [x]  Shortness of breath upon activity? []  Shortness of breath when lying flat? []  Irregular heart rhythm?  Vascular:  []  Pain in calf, thigh, or hip brought on by walking? []  Pain in feet at night that wakes you up from your sleep? []  Blood clot in your veins? []  Leg swelling?  Pulmonary:  []  Oxygen at home? []  Productive cough? []  Wheezing?  Neurologic:  []  Sudden weakness in arms or legs? []  Sudden numbness  in arms or legs? []  Sudden onset of difficult speaking or slurred speech? []  Temporary loss of vision in one eye? []  Problems with dizziness?  Gastrointestinal:  []  Blood in stool? []  Vomited blood?  Genitourinary:  []  Burning when urinating? []  Blood in urine?  Psychiatric:  []  Major depression  Hematologic:  []  Bleeding problems? []  Problems with blood clotting?  Dermatologic:  []  Rashes or ulcers?  Constitutional:  []  Fever or chills?  Ear/Nose/Throat:  []  Change in hearing? []  Nose bleeds? []  Sore throat?  Musculoskeletal:  []  Back pain? []  Joint pain? []  Muscle pain?   Physical Examination     Vitals:   10/23/23 1157 10/23/23 1200 10/23/23 1230 10/23/23 1300  BP:  (!) 101/51 (!) 122/59 116/61  Pulse:  77 76 76  Resp:  12 13 10   Temp:  99 F (37.2 C)    TempSrc:  Oral    SpO2:  97% 98% 98%  Weight: 70.3 kg     Height: 5' 2 (1.575 m)      Body mass index is 28.35 kg/m.  General Alert, O x 3, WD, NAD  Eyes PERRL, EOMI, arcus senilus  Pulmonary Sym exp, good B air movt, CTA B  Cardiac RRR, Nl S1, S2, no Murmurs, No rubs, No S3,S4  Vascular Vessel Right Left  Radial Faintly palpable Not palpable  Brachial Faintly palpable Not palpable  Carotid Palpable, No Bruit  Palpable, No Bruit  Aorta Not palpable N/A  Femoral Palpable Palpable  Popliteal Not palpable Not palpable  PT Not palpable Not palpable  DP Not palpable Not palpable    Musculo- skeletal M/S 5/5 throughout , Extremities without ischemic changes   Neurologic Cranial nerves 2-12 intact, Pain and light touch intact in extremities, Motor exam as listed above  Psychiatric Judgement intact, Mood & affect appropriate for pt's clinical situation    Laboratory   CBC    Latest Ref Rng & Units 10/23/2023   12:00 PM 07/28/2023   10:50 AM 01/16/2023    3:00 AM  CBC  WBC 4.0 - 10.5 K/uL 6.4  6.2  5.3   Hemoglobin 12.0 - 15.0 g/dL 88.3  89.1  88.6   Hematocrit 36.0 - 46.0 % 35.3  33.0  34.1   Platelets 150 - 400 K/uL 182  161  166     BMP    Latest Ref Rng & Units 10/23/2023   12:00 PM 07/28/2023   10:50 AM 01/16/2023    3:00 AM  BMP  Glucose 70 - 99 mg/dL 892  856  840   BUN 8 - 23 mg/dL 11  12  16    Creatinine 0.44 - 1.00 mg/dL 9.10  9.22  9.26   Sodium 135 - 145 mmol/L 140  140  138   Potassium 3.5 - 5.1 mmol/L 3.9  3.9  3.5   Chloride 98 - 111 mmol/L 105  113  105   CO2 22 - 32 mmol/L 25  24  25    Calcium  8.9 - 10.3 mg/dL 9.0  7.7  8.4     Coagulation Lab Results  Component Value Date   INR 1.5 (H) 10/23/2023   INR 1.0 08/30/2019   INR 1.44 11/10/2015   No results found for: PTT  Lipids    Component Value Date/Time   CHOL 160 01/11/2023 0417   CHOL 164 08/11/2020 0932   CHOL 214 (H) 07/14/2013 0542   TRIG 91 01/11/2023 0417   TRIG 118 07/14/2013 0542  HDL 59 01/11/2023 0417   HDL 57 08/11/2020 0932   HDL 36 (L) 07/14/2013 0542   CHOLHDL 2.7 01/11/2023 0417   VLDL 18 01/11/2023 0417   VLDL 24 07/14/2013 0542   LDLCALC 83 01/11/2023 0417   LDLCALC 90 08/11/2020 0932   LDLCALC 154 (H) 07/14/2013 0542     Radiology     MR BRAIN WO CONTRAST Result Date: 10/23/2023 CLINICAL DATA:  Stroke, follow up.  Headache. EXAM: MRI HEAD WITHOUT CONTRAST TECHNIQUE:  Multiplanar, multiecho pulse sequences of the brain and surrounding structures were obtained without intravenous contrast. COMPARISON:  Head CT and CTA 10/23/2023 and MRI 07/28/2023 FINDINGS: Brain: There is no evidence of an acute infarct, intracranial hemorrhage, mass, midline shift, or extra-axial fluid collection. Cerebral volume is within normal limits for age. The ventricles are normal in size. Patchy T2 hyperintensities in the cerebral white matter are similar to the prior MRI and are nonspecific but compatible with moderate chronic small vessel ischemic disease. Chronic lacunar infarcts are again noted in the basal ganglia and deep white matter bilaterally, and there is also an unchanged small chronic cortical infarct in the medial right parietal lobe. Vascular: Major intracranial vascular flow voids are preserved. Skull and upper cervical spine: Unremarkable bone marrow signal. Sinuses/Orbits: Unremarkable orbits. Paranasal sinuses and mastoid air cells are clear. Other: None. IMPRESSION: 1. No acute intracranial abnormality. 2. Moderate chronic small vessel ischemic disease. Electronically Signed   By: Dasie Hamburg M.D.   On: 10/23/2023 15:18   CT ANGIO HEAD NECK W WO CM Result Date: 10/23/2023 CLINICAL DATA:  Neuro deficit, acute, stroke suspected.  Headache. EXAM: CT ANGIOGRAPHY HEAD AND NECK WITH AND WITHOUT CONTRAST TECHNIQUE: Multidetector CT imaging of the head and neck was performed using the standard protocol during bolus administration of intravenous contrast. Multiplanar CT image reconstructions and MIPs were obtained to evaluate the vascular anatomy. Carotid stenosis measurements (when applicable) are obtained utilizing NASCET criteria, using the distal internal carotid diameter as the denominator. RADIATION DOSE REDUCTION: This exam was performed according to the departmental dose-optimization program which includes automated exposure control, adjustment of the mA and/or kV according to  patient size and/or use of iterative reconstruction technique. CONTRAST:  75mL OMNIPAQUE  IOHEXOL  350 MG/ML SOLN COMPARISON:  CTA head and neck 01/01/2022 FINDINGS: CTA NECK FINDINGS Aortic arch: Standard branching with mild atherosclerosis. No significant stenosis of the arch vessel origins. Right carotid system: Patent with calcified plaque about the carotid bifurcation resulting in 55-60% stenosis of the proximal ICA, unchanged. Left carotid system: Patent with scattered calcified and soft plaque throughout the common carotid artery and about the carotid bifurcation without evidence of a significant stenosis. Vertebral arteries: Patent with the left being slightly dominant. Calcified plaque at both vertebral artery origins resulting in new moderate stenosis bilaterally. Skeleton: Advanced cervical spondylosis. Absent maxillary dentition. Carious right mandibular molar. Other neck: No evidence of cervical lymphadenopathy or mass. Upper chest: No mass or consolidation in the included lung apices. Review of the MIP images confirms the above findings CTA HEAD FINDINGS Anterior circulation: The internal carotid arteries are patent from skull base to carotid termini with calcified plaque resulting in mild right and moderate left cavernous and mild bilateral supraclinoid stenoses, similar to the prior CTA. ACAs and MCAs are patent without evidence of a proximal branch occlusion or significant A1 or M1 stenosis. Branch vessel irregularity is again noted including an unchanged moderate distal left M2 branch vessel stenosis. No aneurysm is identified. Posterior circulation: The intracranial vertebral  arteries are patent to the basilar with atherosclerosis in the left V4 segment not resulting in a flow limiting stenosis. The basilar artery is patent with a severe stenosis proximally which is stable to mildly progressive. There are large right and diminutive or absent left posterior communicating arteries with hypoplasia of  the right P1 segment. Both PCAs are patent with branch vessel irregularity but no evidence of a significant proximal stenosis. No aneurysm is identified. Venous sinuses: As permitted by contrast timing, patent. Anatomic variants: Fetal right PCA. Review of the MIP images confirms the above findings IMPRESSION: 1. No large vessel occlusion. 2. Unchanged 55-60% proximal right ICA stenosis. 3. New moderate bilateral vertebral artery origin stenoses. 4. Severe proximal basilar artery stenosis, stable to mildly progressed. 5. Unchanged moderate left and mild right intracranial ICA stenoses. 6.  Aortic Atherosclerosis (ICD10-I70.0). Electronically Signed   By: Dasie Hamburg M.D.   On: 10/23/2023 14:40   CT HEAD CODE STROKE WO CONTRAST Result Date: 10/23/2023 EXAM: CT HEAD WITHOUT 10/23/2023 12:21:24 PM TECHNIQUE: CT of the head was performed without the administration of intravenous contrast. Automated exposure control, iterative reconstruction, and/or weight based adjustment of the mA/kV was utilized to reduce the radiation dose to as low as reasonably achievable. COMPARISON: MR head without contrast 07/28/2023. CT head without contrast 01/10/2023. CLINICAL HISTORY: Neuro deficit, acute, stroke suspected. Code stroke DR Juanell [phone] FINDINGS: BRAIN AND VENTRICLES: No acute intracranial hemorrhage. No mass effect or midline shift. No extra-axial fluid collection. Gray-white differentiation is maintained. No hydrocephalus. Remote lacunar infarcts of the basal ganglia and corona radiata are stable. Periventricular white matter hypoattenuation is moderately advanced for age, stable. ORBITS: No acute abnormality. SINUSES AND MASTOIDS: No acute abnormality. SOFT TISSUES AND SKULL: No acute skull fracture. No acute soft tissue abnormality. VASCULATURE: Atherosclerotic calcifications are present in the cavernous carotid arteries bilaterally and at the dural margin of both vertebral arteries. No hyperdense vessel is  present. [place] stroke program early CT (ASPECT) score ----- Ganglionic (caudate, internal capsule, lentiform nucleus, insula, M1-M3): 7 Supraganglionic (M4-M6): 3 Total: 10 IMPRESSION: 1. No acute intracranial abnormality. 2. Stable remote lacunar infarcts of the basal ganglia and corona radiata. 3. Moderately advanced periventricular white matter hypoattenuation, stable. 4. Atherosclerotic calcifications in the cavernous carotid arteries bilaterally and at the dural margin of both vertebral arteries. No hyperdense vessel is present. Findings were communicated to Dr. Jerrie via the [facility] system at 12:30 pm. Electronically signed by: Lonni Necessary MD 10/23/2023 12:32 PM EDT RP Workstation: [id number]     Medical Decision Making   SHAKIRA LOS is a 73 y.o. female who presents with: headache, possible TIA, chest pain  Pt has MRI evidence of prior BILATERAL strokes CTA does NOT demonstrate ICA stenoses >50% so no indication for carotid intervention I don't have access to a PACS monitor but on my review of the CTA, I don't find either the vertebral or basilar stenoses to be impressive Defer to NeuroIR further evaluation Regardless, I don't think there is a Vascular Surgical indication.   There is NO randomized trial for vertebral disease.  In general, vertebral surgery seems to be limited to academic centers Basilar disease is primarily the domain of NeuroIR  Available as needed   Redell Door, MD, FACS, FSVS Covering for [facility]: [phone]  10/23/2023, 4:24 PM

## 2023-10-23 NOTE — H&P (Addendum)
 History and Physical    Candace [name] FMW:[id number] DOB: [birth year] DOA: 10/23/2023  PCP: Bertrum Charlie CROME, MD (Confirm with patient/family/NH records and if not entered, this has to be entered at [facility] point of entry) Patient coming from: Home  I have personally briefly reviewed patient's old medical records in [facility]  Chief Complaint: Headache, lightheadedness speech problem Chest pain  HPI: Candace [name] is a 73 y.o. female with medical history significant of BPPV, PAF on Eliquis , HTN, HLD, IDDM, multivessel CAD s/p CABG, tremors on Sinemet , orthostatic hypotension, chronic combined HFrEF and HFpEF with LVEF 43-51%, OSA on CPAP, presented with multiple complaints of headache, blurry vision, slurred speech and chest pain.  Patient was at the church meeting and started to have slurred speech.  She described as she does not have any trouble understanding or form sentences in her hand but has hard to  control with speech.  She also complained about feeling headache frontal, dull ache and persistent episodes of blurred vision during the episode.  Later she is also started to have a pressure-like chest pain 3-4/10.  She does report that she has a chronic angina, with intermittent chest pain and she said today's episode was not severe.  Chest pain subsided on arrival in the ED.  She denied any numbness weakness in the limbs, she has been compliant with Eliquis . ED Course: Temperature 99, pulse is 77 blood pressure 101/51 on arrival to saturation 97% on room air.  CT head negative for acute findings, CTA negative for LVO.  MRI negative for acute stroke.  Blood work showed hemoglobin 11.6 WBC 6.4 BUN 11 creatinine 0.8 glucose 107.  Review of Systems: As per HPI otherwise 14 point review of systems negative.    Past Medical History:  Diagnosis Date   Allergy    Anemia    Anxiety    Arrhythmia    Arthritis    Atrial fibrillation (HCC)    CHF (congestive heart failure) (HCC)     COPD (chronic obstructive pulmonary disease) (HCC)    Coronary artery disease    Depression    Diabetes mellitus without complication (HCC)    Dyspnea    doe   Dysrhythmia    GERD (gastroesophageal reflux disease)    Headache    History of hiatal hernia    Hyperlipidemia    Hypertension    Myocardial infarction (HCC)    2016, 04/2017   Myocardial infarction with cardiac rehabilitation [facility])    MI 2016/ CABG 8/17    FINISHED CARDIAC REHAB 3 WEEKS AGO   Panic attack    Pneumonia    Reflux    Stroke (HCC) 2015   showed up on MRI; no weakness noted   TIA (transient ischemic attack)    Voice tremor     Past Surgical History:  Procedure Laterality Date   ABDOMINAL HYSTERECTOMY     APPENDECTOMY  1975   ARTERY BIOPSY Right 04/26/2016   Procedure: BIOPSY TEMPORAL ARTERY;  Surgeon: Deward Argue, MD;  Location: [facility] ORS;  Service: ENT;  Laterality: Right;   CARDIAC CATHETERIZATION N/A 11/06/2015   Procedure: Left Heart Cath and Coronary Angiography;  Surgeon: Wolm JINNY Rhyme, MD;  Location: [facility] INVASIVE CV LAB;  Service: Cardiovascular;  Laterality: N/A;   CESAREAN SECTION     COLONOSCOPY  2015   COLONOSCOPY WITH PROPOFOL  N/A 11/03/2018   Procedure: COLONOSCOPY WITH PROPOFOL ;  Surgeon: Janalyn Keene NOVAK, MD;  Location: [facility] ENDOSCOPY;  Service: Endoscopy;  Laterality:  N/A;   CORONARY ANGIOPLASTY  04/2017   [facility]   CORONARY ARTERY BYPASS GRAFT N/A 11/10/2015   Procedure: CORONARY ARTERY BYPASS GRAFTING (CABG), ON PUMP, TIMES FOUR, USING LEFT INTERNAL MAMMARY ARTERY, BILATERAL GREATER SAPHENOUS VEINS HARVESTED ENDOSCOPICALLY;  Surgeon: Dallas KATHEE Jude, MD;  Location: [facility] OR;  Service: Open Heart Surgery;  Laterality: N/A;  LIMA-LAD; SEQ SVG-OM1-OM2; SVG-PL   CORONARY STENT INTERVENTION N/A 08/05/2016   Procedure: Coronary Stent Intervention;  Surgeon: Ammon Blunt, MD;  Location: [facility] INVASIVE CV LAB;  Service: Cardiovascular;  Laterality: N/A;    ESOPHAGOGASTRODUODENOSCOPY (EGD) WITH PROPOFOL  N/A 11/03/2018   Procedure: ESOPHAGOGASTRODUODENOSCOPY (EGD) WITH PROPOFOL ;  Surgeon: Janalyn Keene KATHEE, MD;  Location: [facility] ENDOSCOPY;  Service: Endoscopy;  Laterality: N/A;   HYSTERECTOMY ABDOMINAL WITH SALPINGO-OOPHORECTOMY Bilateral 08/15/2017   Procedure: HYSTERECTOMY ABDOMINAL WITH BILATERAL SALPINGO-OOPHORECTOMY;  Surgeon: Connell Davies, MD;  Location: [facility] ORS;  Service: Gynecology;  Laterality: Bilateral;   LEFT HEART CATH AND CORONARY ANGIOGRAPHY N/A 08/05/2016   Procedure: Left Heart Cath and Coronary Angiography;  Surgeon: Ammon Blunt, MD;  Location: [facility] INVASIVE CV LAB;  Service: Cardiovascular;  Laterality: N/A;   LEFT HEART CATH AND CORS/GRAFTS ANGIOGRAPHY N/A 09/19/2018   Procedure: LEFT HEART CATH AND CORS/GRAFTS ANGIOGRAPHY;  Surgeon: Hester Wolm PARAS, MD;  Location: [facility] INVASIVE CV LAB;  Service: Cardiovascular;  Laterality: N/A;   LEFT HEART CATH AND CORS/GRAFTS ANGIOGRAPHY N/A 08/30/2019   Procedure: LEFT HEART CATH AND CORS/GRAFTS ANGIOGRAPHY;  Surgeon: Bosie Vinie LABOR, MD;  Location: [facility] INVASIVE CV LAB;  Service: Cardiovascular;  Laterality: N/A;   OOPHORECTOMY     TEE WITHOUT CARDIOVERSION N/A 11/10/2015   Procedure: TRANSESOPHAGEAL ECHOCARDIOGRAM (TEE);  Surgeon: Dallas KATHEE Jude, MD;  Location: [facility] OR;  Service: Open Heart Surgery;  Laterality: N/A;   TUBAL LIGATION       reports that she quit smoking about 22 years ago. Her smoking use included cigarettes. She has never used smokeless tobacco. She reports that she does not drink alcohol and does not use drugs.  Allergies  Allergen Reactions   Tramadol  Other (See Comments)    Causes patient to be off balance and Mental Changes   Lisinopril  Cough   Penicillins Swelling, Rash and Other (See Comments)    Did it involve swelling of the face/tongue/throat, SOB, or low BP? Yes Did it involve sudden or severe rash/hives, skin peeling, or any reaction on the inside of  your mouth or nose? Yes Did you need to seek medical attention at a hospital or doctor's office? Yes When did it last happen?      15 years If all above answers are "NO", may proceed with cephalosporin use.     Family History  Problem Relation Age of Onset   Cancer Father    Hypertension Father    Heart disease Father    Asthma Father    Cancer Mother    Hypertension Mother    Pancreatic cancer Mother 74   Cancer Sister    Breast cancer Sister 31   Breast cancer Sister 22   Lung cancer Brother    Pancreatic cancer Sister 83   Cancer Sister      Prior to Admission medications   Medication Sig Start Date End Date Taking? Authorizing Provider  acetaminophen  (TYLENOL ) 325 MG tablet Take 650 mg by mouth every 6 (six) hours as needed for moderate pain or headache.     [provider]  albuterol  (VENTOLIN  HFA) 108 (90 Base) MCG/ACT inhaler Inhale 2  puffs into the lungs every 6 (six) hours as needed for wheezing or shortness of breath. Patient not taking: Reported on 07/29/2023 01/07/21   Bertrum Charlie CROME, MD  atorvastatin  (LIPITOR ) 80 MG tablet Take 1 tablet (80 mg total) by mouth daily. 07/03/22   Alexander, Natalie, DO  Blood Glucose Monitoring Suppl [facility] VERIO) w/Device KIT Use daily to check blood sugar 03/05/21   Bacigalupo, Angela M, MD  carbidopa -levodopa  (SINEMET  IR) 10-100 MG tablet Take 1 tablet by mouth 3 (three) times daily. 01/17/23   Alexander, Natalie, DO  COMFORT EZ PEN NEEDLES 32G X 4 MM MISC Use to inject insulin  daily 01/04/22   Emilio Kelly DASEN, FNP  Dulaglutide  (TRULICITY ) 1.5 MG/0.5ML SOPN Inject 1.5 mg into the skin once a week. Typically takes Weds, ok to administer 01/14/23 has not had it this week, can resume weekly on Fridays after today's dose 01/14/23   Alexander, Natalie, DO  ELIQUIS  5 MG TABS tablet Take 5 mg by mouth 2 (two) times daily. 03/26/20   [provider]  ezetimibe  (ZETIA ) 10 MG tablet Take 1 tablet (10 mg total) by mouth daily.  01/19/22   Cyndi Shaver, PA-C  glucose blood test strip Use to check blood sugars daily as instructed 03/02/22   Simmons-Robinson, Rockie, MD  insulin  glargine (LANTUS  SOLOSTAR) 100 UNIT/ML Solostar Pen Inject 12 Units into the skin at bedtime. Patient taking differently: Inject 16 Units into the skin at bedtime. 08/21/21   Jens Durand, MD  isosorbide  mononitrate (IMDUR ) 120 MG 24 hr tablet Take 1 tablet (120 mg total) by mouth daily. 01/15/23   Alexander, Natalie, DO  JARDIANCE  10 MG TABS tablet Take 25 mg by mouth every morning.    [provider]  Lancets [facility] DELICA PLUS LANCET33G) MISC USE UP TO 4 TIMES DAILY AS DIRECTED 07/10/19   Bertrum Charlie CROME, MD  midodrine  (PROAMATINE ) 2.5 MG tablet Take 2.5 mg by mouth 2 (two) times daily with a meal. 02/22/23 02/22/24  [provider]  nitroGLYCERIN  (NITROSTAT ) 0.4 MG SL tablet Place 1 tablet (0.4 mg total) under the tongue every 5 (five) minutes as needed for chest pain. Patient not taking: Reported on 07/29/2023 07/27/16   Barbette Cea, MD  pantoprazole  (PROTONIX ) 20 MG tablet Take 1 tablet (20 mg total) by mouth 2 (two) times daily. 05/15/21   Bertrum Charlie CROME, MD  propranolol  ER (INDERAL  LA) 60 MG 24 hr capsule Take 60 mg by mouth daily. 07/15/23   [provider]  ranolazine  (RANEXA ) 1000 MG SR tablet Take 1 tablet (1,000 mg total) by mouth 2 (two) times daily. 07/02/22   Alexander, Natalie, DO  sertraline  (ZOLOFT ) 100 MG tablet Take 1 tablet (100 mg total) by mouth at bedtime. 07/29/23   Alexander, Natalie, DO  sucralfate  (CARAFATE ) 1 g tablet Take 1 g by mouth 4 (four) times daily. 04/27/23 04/26/24  [provider]  cetirizine  (ZYRTEC ) 5 MG tablet Take 2 tablets (10 mg total) by mouth daily. 12/19/18 02/06/20  Bertrum Charlie CROME, MD    Physical Exam: Vitals:   10/23/23 1157 10/23/23 1200 10/23/23 1230 10/23/23 1300  BP:  (!) 101/51 (!) 122/59 116/61  Pulse:  77 76 76  Resp:  12 13 10   Temp:  99 F (37.2  C)    TempSrc:  Oral    SpO2:  97% 98% 98%  Weight: 70.3 kg     Height: 5' 2 (1.575 m)       Constitutional: NAD, calm, comfortable Vitals:  10/23/23 1157 10/23/23 1200 10/23/23 1230 10/23/23 1300  BP:  (!) 101/51 (!) 122/59 116/61  Pulse:  77 76 76  Resp:  12 13 10   Temp:  99 F (37.2 C)    TempSrc:  Oral    SpO2:  97% 98% 98%  Weight: 70.3 kg     Height: 5' 2 (1.575 m)      Eyes: PERRL, lids and conjunctivae normal ENMT: Mucous membranes are dry. Posterior pharynx clear of any exudate or lesions.Normal dentition.  Neck: normal, supple, no masses, no thyromegaly Respiratory: clear to auscultation bilaterally, no wheezing, no crackles. Normal respiratory effort. No accessory muscle use.  Cardiovascular: Regular rate and rhythm, no murmurs / rubs / gallops. No extremity edema. 2+ pedal pulses. No carotid bruits.  Abdomen: no tenderness, no masses palpated. No hepatosplenomegaly. Bowel sounds positive.  Musculoskeletal: no clubbing / cyanosis. No joint deformity upper and lower extremities. Good ROM, no contractures. Normal muscle tone.  Skin: no rashes, lesions, ulcers. No induration Neurologic: CN 2-12 grossly intact. Sensation intact, DTR normal. Strength 5/5 in all 4.  Psychiatric: Normal judgment and insight. Alert and oriented x 3. Normal mood.     Labs on Admission: I have personally reviewed following labs and imaging studies  CBC: Recent Labs  Lab 10/23/23 1200  WBC 6.4  NEUTROABS 3.5  HGB 11.6*  HCT 35.3*  MCV 100.9*  PLT 182   Basic Metabolic Panel: Recent Labs  Lab 10/23/23 1200  NA 140  K 3.9  CL 105  CO2 25  GLUCOSE 107*  BUN 11  CREATININE 0.89  CALCIUM  9.0   GFR: Estimated Creatinine Clearance: 52.5 mL/min (by C-G formula based on SCr of 0.89 mg/dL). Liver Function Tests: Recent Labs  Lab 10/23/23 1200  AST 17  ALT <5  ALKPHOS 100  BILITOT 0.8  PROT 6.7  ALBUMIN  3.4*   No results for input(s): LIPASE, AMYLASE in the last  168 hours. No results for input(s): AMMONIA in the last 168 hours. Coagulation Profile: Recent Labs  Lab 10/23/23 1200  INR 1.5*   Cardiac Enzymes: No results for input(s): CKTOTAL, CKMB, CKMBINDEX, TROPONINI in the last 168 hours. BNP (last 3 results) No results for input(s): PROBNP in the last 8760 hours. HbA1C: No results for input(s): HGBA1C in the last 72 hours. CBG: No results for input(s): GLUCAP in the last 168 hours. Lipid Profile: No results for input(s): CHOL, HDL, LDLCALC, TRIG, CHOLHDL, LDLDIRECT in the last 72 hours. Thyroid  Function Tests: No results for input(s): TSH, T4TOTAL, FREET4, T3FREE, THYROIDAB in the last 72 hours. Anemia Panel: No results for input(s): VITAMINB12, FOLATE, FERRITIN, TIBC, IRON, RETICCTPCT in the last 72 hours. Urine analysis:    Component Value Date/Time   COLORURINE YELLOW (A) 07/28/2023 1050   APPEARANCEUR CLEAR (A) 07/28/2023 1050   APPEARANCEUR Clear 12/24/2014 0905   LABSPEC 1.030 07/28/2023 1050   LABSPEC 1.030 09/14/2013 2123   PHURINE 7.0 07/28/2023 1050   GLUCOSEU >=500 (A) 07/28/2023 1050   GLUCOSEU Negative 09/14/2013 2123   HGBUR NEGATIVE 07/28/2023 1050   BILIRUBINUR NEGATIVE 07/28/2023 1050   BILIRUBINUR Negative 02/06/2020 1014   BILIRUBINUR Negative 12/24/2014 0905   BILIRUBINUR Negative 09/14/2013 2123   KETONESUR NEGATIVE 07/28/2023 1050   PROTEINUR NEGATIVE 07/28/2023 1050   UROBILINOGEN 0.2 02/06/2020 1014   NITRITE NEGATIVE 07/28/2023 1050   LEUKOCYTESUR TRACE (A) 07/28/2023 1050   LEUKOCYTESUR 3+ 09/14/2013 2123    Radiological Exams on Admission: MR BRAIN WO CONTRAST Result Date: 10/23/2023 CLINICAL  DATA:  Stroke, follow up.  Headache. EXAM: MRI HEAD WITHOUT CONTRAST TECHNIQUE: Multiplanar, multiecho pulse sequences of the brain and surrounding structures were obtained without intravenous contrast. COMPARISON:  Head CT and CTA 10/23/2023 and MRI 07/28/2023  FINDINGS: Brain: There is no evidence of an acute infarct, intracranial hemorrhage, mass, midline shift, or extra-axial fluid collection. Cerebral volume is within normal limits for age. The ventricles are normal in size. Patchy T2 hyperintensities in the cerebral white matter are similar to the prior MRI and are nonspecific but compatible with moderate chronic small vessel ischemic disease. Chronic lacunar infarcts are again noted in the basal ganglia and deep white matter bilaterally, and there is also an unchanged small chronic cortical infarct in the medial right parietal lobe. Vascular: Major intracranial vascular flow voids are preserved. Skull and upper cervical spine: Unremarkable bone marrow signal. Sinuses/Orbits: Unremarkable orbits. Paranasal sinuses and mastoid air cells are clear. Other: None. IMPRESSION: 1. No acute intracranial abnormality. 2. Moderate chronic small vessel ischemic disease. Electronically Signed   By: Dasie Hamburg M.D.   On: 10/23/2023 15:18   CT ANGIO HEAD NECK W WO CM Result Date: 10/23/2023 CLINICAL DATA:  Neuro deficit, acute, stroke suspected.  Headache. EXAM: CT ANGIOGRAPHY HEAD AND NECK WITH AND WITHOUT CONTRAST TECHNIQUE: Multidetector CT imaging of the head and neck was performed using the standard protocol during bolus administration of intravenous contrast. Multiplanar CT image reconstructions and MIPs were obtained to evaluate the vascular anatomy. Carotid stenosis measurements (when applicable) are obtained utilizing NASCET criteria, using the distal internal carotid diameter as the denominator. RADIATION DOSE REDUCTION: This exam was performed according to the departmental dose-optimization program which includes automated exposure control, adjustment of the mA and/or kV according to patient size and/or use of iterative reconstruction technique. CONTRAST:  75mL OMNIPAQUE  IOHEXOL  350 MG/ML SOLN COMPARISON:  CTA head and neck 01/01/2022 FINDINGS: CTA NECK FINDINGS  Aortic arch: Standard branching with mild atherosclerosis. No significant stenosis of the arch vessel origins. Right carotid system: Patent with calcified plaque about the carotid bifurcation resulting in 55-60% stenosis of the proximal ICA, unchanged. Left carotid system: Patent with scattered calcified and soft plaque throughout the common carotid artery and about the carotid bifurcation without evidence of a significant stenosis. Vertebral arteries: Patent with the left being slightly dominant. Calcified plaque at both vertebral artery origins resulting in new moderate stenosis bilaterally. Skeleton: Advanced cervical spondylosis. Absent maxillary dentition. Carious right mandibular molar. Other neck: No evidence of cervical lymphadenopathy or mass. Upper chest: No mass or consolidation in the included lung apices. Review of the MIP images confirms the above findings CTA HEAD FINDINGS Anterior circulation: The internal carotid arteries are patent from skull base to carotid termini with calcified plaque resulting in mild right and moderate left cavernous and mild bilateral supraclinoid stenoses, similar to the prior CTA. ACAs and MCAs are patent without evidence of a proximal branch occlusion or significant A1 or M1 stenosis. Branch vessel irregularity is again noted including an unchanged moderate distal left M2 branch vessel stenosis. No aneurysm is identified. Posterior circulation: The intracranial vertebral arteries are patent to the basilar with atherosclerosis in the left V4 segment not resulting in a flow limiting stenosis. The basilar artery is patent with a severe stenosis proximally which is stable to mildly progressive. There are large right and diminutive or absent left posterior communicating arteries with hypoplasia of the right P1 segment. Both PCAs are patent with branch vessel irregularity but no evidence of a significant proximal stenosis.  No aneurysm is identified. Venous sinuses: As permitted  by contrast timing, patent. Anatomic variants: Fetal right PCA. Review of the MIP images confirms the above findings IMPRESSION: 1. No large vessel occlusion. 2. Unchanged 55-60% proximal right ICA stenosis. 3. New moderate bilateral vertebral artery origin stenoses. 4. Severe proximal basilar artery stenosis, stable to mildly progressed. 5. Unchanged moderate left and mild right intracranial ICA stenoses. 6.  Aortic Atherosclerosis (ICD10-I70.0). Electronically Signed   By: Dasie Hamburg M.D.   On: 10/23/2023 14:40   CT HEAD CODE STROKE WO CONTRAST Result Date: 10/23/2023 EXAM: CT HEAD WITHOUT 10/23/2023 12:21:24 PM TECHNIQUE: CT of the head was performed without the administration of intravenous contrast. Automated exposure control, iterative reconstruction, and/or weight based adjustment of the mA/kV was utilized to reduce the radiation dose to as low as reasonably achievable. COMPARISON: MR head without contrast 07/28/2023. CT head without contrast 01/10/2023. CLINICAL HISTORY: Neuro deficit, acute, stroke suspected. Code stroke DR Juanell [phone] FINDINGS: BRAIN AND VENTRICLES: No acute intracranial hemorrhage. No mass effect or midline shift. No extra-axial fluid collection. Gray-white differentiation is maintained. No hydrocephalus. Remote lacunar infarcts of the basal ganglia and corona radiata are stable. Periventricular white matter hypoattenuation is moderately advanced for age, stable. ORBITS: No acute abnormality. SINUSES AND MASTOIDS: No acute abnormality. SOFT TISSUES AND SKULL: No acute skull fracture. No acute soft tissue abnormality. VASCULATURE: Atherosclerotic calcifications are present in the cavernous carotid arteries bilaterally and at the dural margin of both vertebral arteries. No hyperdense vessel is present. [place] stroke program early CT (ASPECT) score ----- Ganglionic (caudate, internal capsule, lentiform nucleus, insula, M1-M3): 7 Supraganglionic (M4-M6): 3 Total: 10 IMPRESSION:  1. No acute intracranial abnormality. 2. Stable remote lacunar infarcts of the basal ganglia and corona radiata. 3. Moderately advanced periventricular white matter hypoattenuation, stable. 4. Atherosclerotic calcifications in the cavernous carotid arteries bilaterally and at the dural margin of both vertebral arteries. No hyperdense vessel is present. Findings were communicated to Dr. Jerrie via the [facility] system at 12:30 pm. Electronically signed by: Lonni Necessary MD 10/23/2023 12:32 PM EDT RP Workstation: [id number]    EKG: Independently reviewed.  Sinus, chronic LBBB  Assessment/Plan Principal Problem:   TIA (transient ischemic attack) Active Problems:   Atypical chest pain   Dysarthria  (please populate well all problems here in Problem List. (For example, if patient is on BP meds at home and you resume or decide to hold them, it is a problem that needs to be her. Same for CAD, COPD, HLD and so on)  TIA Acute dysarthria, improving - MRI negative for acute findings - Continue Eliquis  - Allow permissive hypertension - Risk factor modification check A1c and lipid panel -Echo - PT OT and speech evaluation  Anginal-like chest pain Multivessel CAD status post CABG Chronic stable angina - Went through patient's cardiology record, showed patient had a stress test done in June this year, which showed reversible ischemia and indication for multivessel CAD.  Patient reported that she does have episodic chest pain, angina in nature, and today's chest pain very much like to features of chest pain she had before.  Workup so far negative with troponins negative x 2.  EKG showed a chronic L recommend outpatient follow-up with cardiology for elective cath. - Continue ranolazine   Near syncope History of orthostatic hypotension - Blood pressure put on low.  Family also concerned patient has not been eating or drinking enough before heading out to church this morning.  Clinically patient does not  appear to  be mildly volume contracted, will K-Vescent more 500 mL IV bolus - Check orthostatic vital signs - Continue midodrine   Chronic HFrEF and HFpEF -Clinically patient appears to have mild hydration - Continue propranolol   PAF - Sinus rhythm, continue Eliquis   IIDM -Hold all p.o. diabetic medication -SSI  Tremors -Continue Sinemet   Total time spent on patient care 55 minutes  DVT prophylaxis: Eliquis  Eliquis  Code Status: Full code Family Communication: Daughter at bedside Disposition Plan: Expect less than 2 midnight hospital stay Consults called: Neurology Admission status: Telemetry observation   Cort ONEIDA Mana MD [facility] Pager [phone]  10/23/2023, 3:45 PM

## 2023-10-23 NOTE — ED Triage Notes (Signed)
 Pt arrives via [facility] from church for a headache and chest pain. Symptoms started 45 mins ago from this note. Hx of multiples bypasses and stents placed. Hx of dysphagia at baseline. EMS gave 3 doses of SL nitroglycerin  in route.

## 2023-10-23 NOTE — ED Notes (Signed)
 Advised nurse that patient has ready bed

## 2023-10-24 ENCOUNTER — Observation Stay
Admit: 2023-10-24 | Discharge: 2023-10-24 | Disposition: A | Discharge status: Home / Self Care | Attending: Internal Medicine

## 2023-10-24 DIAGNOSIS — G459 Transient cerebral ischemic attack, unspecified: Secondary | ICD-10-CM | POA: Diagnosis not present

## 2023-10-24 LAB — LIPID PANEL
Cholesterol: 132 mg/dL (ref 0–200)
HDL: 54 mg/dL (ref >40)
LDL Cholesterol: 63 mg/dL (ref 0–99)
Total CHOL/HDL Ratio: 2.4 ratio
Triglycerides: 74 mg/dL (ref <150)
VLDL: 15 mg/dL (ref 0–40)

## 2023-10-24 LAB — GLUCOSE, CAPILLARY
Glucose-Capillary: 108 mg/dL — ABNORMAL HIGH (ref 70–99)
Glucose-Capillary: 148 mg/dL — ABNORMAL HIGH (ref 70–99)
Glucose-Capillary: 151 mg/dL — ABNORMAL HIGH (ref 70–99)
Glucose-Capillary: 156 mg/dL — ABNORMAL HIGH (ref 70–99)
Glucose-Capillary: 87 mg/dL (ref 70–99)
Glucose-Capillary: 96 mg/dL (ref 70–99)

## 2023-10-24 LAB — ECHOCARDIOGRAM COMPLETE
AR max vel: 1.56 cm2
AV Area VTI: 1.58 cm2
AV Area mean vel: 1.6 cm2
AV Mean grad: 4.7 mmHg
AV Peak grad: 9.3 mmHg
Ao pk vel: 1.52 m/s
Area-P 1/2: 3.43 cm2
Height: 62 in
MV VTI: 1.39 cm2
S' Lateral: 2.7 cm
Weight: 2480 [oz_av]

## 2023-10-24 MED ORDER — EMPAGLIFLOZIN 25 MG PO TABS
25.0000 mg | ORAL_TABLET | Freq: Every morning | ORAL | Status: DC
  Administered 2023-10-24 – 2023-10-26 (×3): 25 mg via ORAL
  Filled 2023-10-24 (×3): qty 1

## 2023-10-24 MED ORDER — ISOSORBIDE MONONITRATE ER 30 MG PO TB24
60.0000 mg | ORAL_TABLET | Freq: Every day | ORAL | Status: DC
  Administered 2023-10-24: 60 mg via ORAL
  Filled 2023-10-24: qty 2

## 2023-10-24 NOTE — Plan of Care (Signed)

## 2023-10-24 NOTE — Evaluation (Signed)
 Speech Language Pathology Evaluation Patient Details Name: Candace [name] MRN: [id number] DOB: 08-19-1950 Today's Date: 10/24/2023 Time: 9091-9083 SLP Time Calculation (min) (ACUTE ONLY): 8 min  Problem List:  Patient Active Problem List   Diagnosis Date Noted   Dysarthria 10/23/2023   Near syncope 07/28/2023   Overweight (BMI 25.0-29.9) 07/28/2023   Prolonged QT interval 07/28/2023   Syncope 01/10/2023   Back pain 01/10/2023   DNR (do not resuscitate) 06/30/2022   Suprapubic abdominal pain 06/30/2022   Blurred vision, right eye 01/08/2022   History of vertigo 08/16/2021   Obesity (BMI 30-39.9) 08/14/2021   Atrial fibrillation with RVR (HCC) 08/13/2021   Hypokalemia 08/12/2021   COVID-19 08/11/2021   Orthostatic hypotension 08/11/2021   Left hip pain 06/30/2021   Paroxysmal atrial fibrillation (HCC) 07/10/2020   History of coronary artery bypass graft 07/10/2020   Vertigo    Unsteady gait    Acute bronchitis 03/07/2019   Esophageal dysphagia    Stomach irritation    Gastric polyp    Esophageal lump    Hx of colonic polyp    Polyp of colon    Diverticulosis of large intestine without diverticulitis    PSVT (paroxysmal supraventricular tachycardia) (HCC) 10/03/2018   Abnormal ECG 07/05/2018   Tachycardia 03/27/2018   Pain in limb 02/28/2018   Chronic diastolic CHF (congestive heart failure) (HCC) 11/03/2017   TIA (transient ischemic attack) 11/03/2017   COPD suggested by initial evaluation (HCC) 08/23/2017   Incidental pulmonary nodule 08/01/2017   Non-ST elevation myocardial infarction (NSTEMI), subendocardial infarction, subsequent episode of care (HCC) 06/01/2017   B12 deficiency 12/01/2016   Vitamin D  deficiency 11/04/2016   Depression with anxiety 09/30/2016   Generalized weakness 09/30/2016   Tremors of nervous system 09/30/2016   Mild obstructive sleep apnea 09/30/2016   Headache 08/18/2016   Unstable angina (HCC) 08/03/2016   Type 2 diabetes mellitus with  diabetic polyneuropathy, with long-term current use of insulin  (HCC) 08/03/2016   Chronic daily headache 05/05/2016   Asthma 11/11/2015   S/P CABG x 4 11/10/2015   Presence of aortocoronary bypass graft 11/10/2015   Coronary artery disease 11/07/2015   Carotid artery narrowing 02/08/2014   Chest pain 02/08/2014   HLD (hyperlipidemia) 02/08/2014   Bilateral carotid artery stenosis 02/08/2014   Atypical chest pain 02/07/2014   Essential hypertension 02/07/2014   Awareness of heartbeats 02/07/2014   Diarrhea 10/05/2013   Gastroesophageal reflux disease 10/05/2013   Past Medical History:  Past Medical History:  Diagnosis Date   Allergy    Anemia    Anxiety    Arrhythmia    Arthritis    Atrial fibrillation (HCC)    CHF (congestive heart failure) (HCC)    COPD (chronic obstructive pulmonary disease) (HCC)    Coronary artery disease    Depression    Diabetes mellitus without complication (HCC)    Dyspnea    doe   Dysrhythmia    GERD (gastroesophageal reflux disease)    Headache    History of hiatal hernia    Hyperlipidemia    Hypertension    Myocardial infarction (HCC)    2016, 04/2017   Myocardial infarction with cardiac rehabilitation [facility])    MI 2016/ CABG 8/17    FINISHED CARDIAC REHAB 3 WEEKS AGO   Panic attack    Pneumonia    Reflux    Stroke (HCC) 2015   showed up on MRI; no weakness noted   TIA (transient ischemic attack)    Voice tremor  Past Surgical History:  Past Surgical History:  Procedure Laterality Date   ABDOMINAL HYSTERECTOMY     APPENDECTOMY  1975   ARTERY BIOPSY Right 04/26/2016   Procedure: BIOPSY TEMPORAL ARTERY;  Surgeon: Deward Argue, MD;  Location: [facility] ORS;  Service: ENT;  Laterality: Right;   CARDIAC CATHETERIZATION N/A 11/06/2015   Procedure: Left Heart Cath and Coronary Angiography;  Surgeon: Wolm JINNY Rhyme, MD;  Location: [facility];  Service: Cardiovascular;  Laterality: N/A;   CESAREAN SECTION     COLONOSCOPY  2015    COLONOSCOPY WITH PROPOFOL  N/A 11/03/2018   Procedure: COLONOSCOPY WITH PROPOFOL ;  Surgeon: Janalyn Keene NOVAK, MD;  Location: [facility] ENDOS[facility];  Service: Endoscopy;  Laterality: N/A;   CORONARY ANGIOPLASTY  04/2017   [facility]   CORONARY ARTERY BYPASS GRAFT N/A 11/10/2015   Procedure: CORONARY ARTERY BYPASS GRAFTING (CABG), ON PUMP, TIMES FOUR, USING LEFT INTERNAL MAMMARY ARTERY, BILATERAL GREATER SAPHENOUS VEINS HARVESTED ENDOSCOPICALLY;  Surgeon: Dallas NOVAK Jude, MD;  Location: [facility] OR;  Service: Open Heart Surgery;  Laterality: N/A;  LIMA-LAD; SEQ SVG-OM1-OM2; SVG-PL   CORONARY STENT INTERVENTION N/A 08/05/2016   Procedure: Coronary Stent Intervention;  Surgeon: Ammon Blunt, MD;  Location: [facility];  Service: Cardiovascular;  Laterality: N/A;   ESOPHAGOGASTRODUODENOSCOPY (EGD) WITH PROPOFOL  N/A 11/03/2018   Procedure: ESOPHAGOGASTRODUODENOSCOPY (EGD) WITH PROPOFOL ;  Surgeon: Janalyn Keene NOVAK, MD;  Location: [facility];  Service: Endoscopy;  Laterality: N/A;   HYSTERECTOMY ABDOMINAL WITH SALPINGO-OOPHORECTOMY Bilateral 08/15/2017   Procedure: HYSTERECTOMY ABDOMINAL WITH BILATERAL SALPINGO-OOPHORECTOMY;  Surgeon: Connell Davies, MD;  Location: [facility] ORS;  Service: Gynecology;  Laterality: Bilateral;   LEFT HEART CATH AND CORONARY ANGIOGRAPHY N/A 08/05/2016   Procedure: Left Heart Cath and Coronary Angiography;  Surgeon: Ammon Blunt, MD;  Location: [facility];  Service: Cardiovascular;  Laterality: N/A;   LEFT HEART CATH AND CORS/GRAFTS ANGIOGRAPHY N/A 09/19/2018   Procedure: LEFT HEART CATH AND CORS/GRAFTS ANGIOGRAPHY;  Surgeon: Rhyme Wolm JINNY, MD;  Location: [facility];  Service: Cardiovascular;  Laterality: N/A;   LEFT HEART CATH AND CORS/GRAFTS ANGIOGRAPHY N/A 08/30/2019   Procedure: LEFT HEART CATH AND CORS/GRAFTS ANGIOGRAPHY;  Surgeon: Bosie Vinie LABOR, MD;  Location: [facility];  Service: Cardiovascular;  Laterality: N/A;    OOPHORECTOMY     TEE WITHOUT CARDIOVERSION N/A 11/10/2015   Procedure: TRANSESOPHAGEAL ECHOCARDIOGRAM (TEE);  Surgeon: Dallas NOVAK Jude, MD;  Location[facility] OR;  Service: Open Heart Surgery;  Laterality: N/A;   TUBAL LIGATION     HPI:  Candace [name] is a 73 y.o. female with medical history significant of BPPV, PAF on Eliquis , HTN, HLD, IDDM, multivessel CAD s/p CABG, tremors on Sinemet , orthostatic hypotension, chronic combined HFrEF and HFpEF with LVEF 43-51%, OSA on CPAP, presented with multiple complaints of headache, blurry vision, slurred speech and chest pain. Patient was at the church meeting and started to have slurred speech.  She described as she does not have any trouble understanding or form sentences in her hand but has hard to  control with speech.  She also complained about feeling headache frontal, dull ache and persistent episodes of blurred vision during the episode.  Later she is also started to have a pressure-like chest pain 3-4/10.  MRI on 10/23/2023 revealed There is no evidence of an acute infarct, intracranial   hemorrhage, mass, midline shift, or extra-axial fluid collection.   Cerebral volume is within normal limits for age. The ventricles are   normal in  size. Patchy T2 hyperintensities in the cerebral white   matter are similar to the prior MRI and are nonspecific but   compatible with moderate chronic small vessel ischemic disease.   Chronic lacunar infarcts are again noted in the basal ganglia and   deep white matter bilaterally, and there is also an unchanged small   chronic cortical infarct in the medial right parietal lobe.   Assessment / Plan / Recommendation Clinical Impression   At bedside pt reports that her speech is almost back to normal. Prior to this admission, pt reports vocal tremor after similar episode in 2015. She describes the tremor as waxing and waning. During this evaluation, pt's speech intelligibility was 100% with an unfamiliar listener at  the complex conversation level. Her vocal quality did have an intermittent vocal tremor but no slurring of speech. At this time, skilled ST services while pt is an inpatient are not indicated but education provided on potential of outpatient ST services to target vocal tremor were provided. Pt voiced understanding.    SLP Assessment  SLP Recommendation/Assessment: Patient does not need any further Speech Language Pathology Services SLP Visit Diagnosis: Dysarthria and anarthria (R47.1)        Functional Status Assessment Patient has not had a recent decline in their functional status        SLP Evaluation Cognition  Overall Cognitive Status: Within Functional Limits for tasks assessed Arousal/Alertness: Awake/alert Orientation Level: Oriented X4 Attention: Selective Selective Attention: Appears intact Memory: Appears intact Awareness: Appears intact Problem Solving: Appears intact       Comprehension  Auditory Comprehension Overall Auditory Comprehension: Appears within functional limits for tasks assessed    Expression Expression Primary Mode of Expression: Verbal Verbal Expression Overall Verbal Expression: Appears within functional limits for tasks assessed   Oral / Motor  Oral Motor/Sensory Function Overall Oral Motor/Sensory Function: Within functional limits Motor Speech Overall Motor Speech: Appears within functional limits for tasks assessed Phonation:  (vocal tremor at baseline slightly worsened with this admission) Resonance: Within functional limits Articulation: Within functional limitis Intelligibility: Intelligible Motor Planning: Within functional limits Motor Speech Errors: Not applicable           Zebulun Deman B. Rubbie, M.S., CCC-SLP, Tree surgeon Certified Brain Injury Specialist [facility]  [facility] Rehabilitation Services Office [phone] Ascom [phone] Fax [phone]

## 2023-10-24 NOTE — Care Management Obs Status (Signed)
 MEDICARE OBSERVATION STATUS NOTIFICATION   Patient Details  Name: Candace [name] MRN: [id number] Date of Birth: 1950-11-19   Medicare Observation Status Notification Given:  Yes    Cecile Gillispie W, CMA 10/24/2023, 12:15 PM

## 2023-10-24 NOTE — Evaluation (Signed)
 Clinical/Bedside Swallow Evaluation Patient Details  Name: Candace [name] MRN: [id number] Date of Birth: 1951-02-29  Today's Date: 10/24/2023 Time: SLP Start Time (ACUTE ONLY): 0900 SLP Stop Time (ACUTE ONLY): 0908 SLP Time Calculation (min) (ACUTE ONLY): 8 min  Past Medical History:  Past Medical History:  Diagnosis Date   Allergy    Anemia    Anxiety    Arrhythmia    Arthritis    Atrial fibrillation (HCC)    CHF (congestive heart failure) (HCC)    COPD (chronic obstructive pulmonary disease) (HCC)    Coronary artery disease    Depression    Diabetes mellitus without complication (HCC)    Dyspnea    doe   Dysrhythmia    GERD (gastroesophageal reflux disease)    Headache    History of hiatal hernia    Hyperlipidemia    Hypertension    Myocardial infarction (HCC)    2016, 04/2017   Myocardial infarction with cardiac rehabilitation [facility])    MI 2016/ CABG 8/17    FINISHED CARDIAC REHAB 3 WEEKS AGO   Panic attack    Pneumonia    Reflux    Stroke (HCC) 2015   showed up on MRI; no weakness noted   TIA (transient ischemic attack)    Voice tremor    Past Surgical History:  Past Surgical History:  Procedure Laterality Date   ABDOMINAL HYSTERECTOMY     APPENDECTOMY  1975   ARTERY BIOPSY Right 04/26/2016   Procedure: BIOPSY TEMPORAL ARTERY;  Surgeon: Deward Argue, MD;  Location: [facility] ORS;  Service: ENT;  Laterality: Right;   CARDIAC CATHETERIZATION N/A 11/06/2015   Procedure: Left Heart Cath and Coronary Angiography;  Surgeon: Wolm JINNY Rhyme, MD;  Location: [facility];  Service: Cardiovascular;  Laterality: N/A;   CESAREAN SECTION     COLONOSCOPY  2015   COLONOSCOPY WITH PROPOFOL  N/A 11/03/2018   Procedure: COLONOSCOPY WITH PROPOFOL ;  Surgeon: Janalyn Keene NOVAK, MD;  Location: [facility];  Service: Endoscopy;  Laterality: N/A;   CORONARY ANGIOPLASTY  04/2017   [facility]   CORONARY ARTERY BYPASS GRAFT N/A 11/10/2015   Procedure: CORONARY ARTERY  BYPASS GRAFTING (CABG), ON PUMP, TIMES FOUR, USING LEFT INTERNAL MAMMARY ARTERY, BILATERAL GREATER SAPHENOUS VEINS HARVESTED ENDOSCOPICALLY;  Surgeon: Dallas NOVAK Jude, MD;  Location: [facility] OR;  Service: Open Heart Surgery;  Laterality: N/A;  LIMA-LAD; SEQ SVG-OM1-OM2; SVG-PL   CORONARY STENT INTERVENTION N/A 08/05/2016   Procedure: Coronary Stent Intervention;  Surgeon: Ammon Blunt, MD;  Location: [facility];  Service: Cardiovascular;  Laterality: N/A;   ESOPHAGOGASTRODUODENOSCOPY (EGD) WITH PROPOFOL  N/A 11/03/2018   Procedure: ESOPHAGOGASTRODUODENOSCOPY (EGD) WITH PROPOFOL ;  Surgeon: Janalyn Keene NOVAK, MD;  Location: [facility];  Service: Endoscopy;  Laterality: N/A;   HYSTERECTOMY ABDOMINAL WITH SALPINGO-OOPHORECTOMY Bilateral 08/15/2017   Procedure: HYSTERECTOMY ABDOMINAL WITH BILATERAL SALPINGO-OOPHORECTOMY;  Surgeon: Connell Davies, MD;  Location: [facility] ORS;  Service: Gynecology;  Laterality: Bilateral;   LEFT HEART CATH AND CORONARY ANGIOGRAPHY N/A 08/05/2016   Procedure: Left Heart Cath and Coronary Angiography;  Surgeon: Ammon Blunt, MD;  Location: [facility];  Service: Cardiovascular;  Laterality: N/A;   LEFT HEART CATH AND CORS/GRAFTS ANGIOGRAPHY N/A 09/19/2018   Procedure: LEFT HEART CATH AND CORS/GRAFTS ANGIOGRAPHY;  Surgeon: Rhyme Wolm JINNY, MD;  Location: [facility];  Service: Cardiovascular;  Laterality: N/A;   LEFT HEART CATH AND CORS/GRAFTS ANGIOGRAPHY N/A 08/30/2019   Procedure: LEFT HEART CATH AND CORS/GRAFTS ANGIOGRAPHY;  Surgeon: Bosie,  Vinie LABOR, MD;  Location: [facility];  Service: Cardiovascular;  Laterality: N/A;   OOPHORECTOMY     TEE WITHOUT CARDIOVERSION N/A 11/10/2015   Procedure: TRANSESOPHAGEAL ECHOCARDIOGRAM (TEE);  Surgeon: Dallas KATHEE Jude, MD;  Location: [facility] OR;  Service: Open Heart Surgery;  Laterality: N/A;   TUBAL LIGATION     HPI:  Candace [name] is a 73 y.o. female with medical history significant of  BPPV, PAF on Eliquis , HTN, HLD, IDDM, multivessel CAD s/p CABG, tremors on Sinemet , orthostatic hypotension, chronic combined HFrEF and HFpEF with LVEF 43-51%, OSA on CPAP, presented with multiple complaints of headache, blurry vision, slurred speech and chest pain. Patient was at the church meeting and started to have slurred speech.  She described as she does not have any trouble understanding or form sentences in her hand but has hard to  control with speech.  She also complained about feeling headache frontal, dull ache and persistent episodes of blurred vision during the episode.  Later she is also started to have a pressure-like chest pain 3-4/10.  MRI on 10/23/2023 revealed There is no evidence of an acute infarct, intracranial   hemorrhage, mass, midline shift, or extra-axial fluid collection.   Cerebral volume is within normal limits for age. The ventricles are   normal in size. Patchy T2 hyperintensities in the cerebral white   matter are similar to the prior MRI and are nonspecific but   compatible with moderate chronic small vessel ischemic disease.   Chronic lacunar infarcts are again noted in the basal ganglia and   deep white matter bilaterally, and there is also an unchanged small   chronic cortical infarct in the medial right parietal lobe.    Assessment / Plan / Recommendation  Clinical Impression  ST consulted as pt failed Stroke Swallow Screen (she stopped drinking prior to completion of the 3 oz water challenge). During this evaluation, pt presented with adequate oropharyngeal abilities when consuming thin liquids via straw, puree and graham crackers. At this time, pt's risk of aspiration d/t oropharyngeal abilities appears reduced when following general aspiration precautions.   She does report baseline intermittent globus sensation and already has an outpatient appt scheduled with GI in October (2025). No reports of globus sensation during this evaluation. At this time, pt appears  appropriate for a regular diet with thin liquids, medicine whole with thin liquids. No further ST services indicated for dysphagia.   SLP Visit Diagnosis: Dysphagia, unspecified (R13.10)    Aspiration Risk  Mild aspiration risk;No limitations    Diet Recommendation Regular;Thin liquid    Liquid Administration via: Cup;Straw Medication Administration: Whole meds with liquid Supervision: Patient able to self feed Compensations: Minimize environmental distractions;Slow rate;Small sips/bites Postural Changes: Seated upright at 90 degrees;Remain upright for at least 30 minutes after po intake    Other  Recommendations Recommended Consults:  (Pt already has an outpatient appt with GI (October 2025)) Oral Care Recommendations: Oral care BID        Functional Status Assessment Patient has not had a recent decline in their functional status    Swallow Study   General Date of Onset: 10/23/23 HPI: CAYMAN BROGDEN is a 73 y.o. female with medical history significant of BPPV, PAF on Eliquis , HTN, HLD, IDDM, multivessel CAD s/p CABG, tremors on Sinemet , orthostatic hypotension, chronic combined HFrEF and HFpEF with LVEF 43-51%, OSA on CPAP, presented with multiple complaints of headache, blurry vision, slurred speech and chest pain. Patient was at the church meeting  and started to have slurred speech.  She described as she does not have any trouble understanding or form sentences in her hand but has hard to  control with speech.  She also complained about feeling headache frontal, dull ache and persistent episodes of blurred vision during the episode.  Later she is also started to have a pressure-like chest pain 3-4/10.  MRI on 10/23/2023 revealed There is no evidence of an acute infarct, intracranial   hemorrhage, mass, midline shift, or extra-axial fluid collection.   Cerebral volume is within normal limits for age. The ventricles are   normal in size. Patchy T2 hyperintensities in the cerebral white    matter are similar to the prior MRI and are nonspecific but   compatible with moderate chronic small vessel ischemic disease.   Chronic lacunar infarcts are again noted in the basal ganglia and   deep white matter bilaterally, and there is also an unchanged small   chronic cortical infarct in the medial right parietal lobe. Type of Study: Bedside Swallow Evaluation Previous Swallow Assessment: none in chart, baseline report of potential esophageal dysphagia with referral already made by PCP to GI Diet Prior to this Study: NPO (pt stoped drinking during the Stroke Swallow Screen) Temperature Spikes Noted: No Respiratory Status: Room air History of Recent Intubation: No Behavior/Cognition: Alert;Cooperative;Pleasant mood Oral Cavity Assessment: Within Functional Limits Oral Care Completed by SLP: No Oral Cavity - Dentition: Adequate natural dentition Vision: Functional for self-feeding Self-Feeding Abilities: Able to feed self Patient Positioning: Upright in bed Baseline Vocal Quality:  (tremor (started in 2015 during similar episode as this admission)) Volitional Cough: Strong Volitional Swallow: Able to elicit    Oral/Motor/Sensory Function Overall Oral Motor/Sensory Function: Within functional limits   Ice Chips Ice chips: Not tested   Thin Liquid Thin Liquid: Within functional limits Presentation: Self Fed;Straw    Nectar Thick Nectar Thick Liquid: Not tested   Honey Thick Honey Thick Liquid: Not tested   Puree Puree: Within functional limits Presentation: Self Fed;Spoon   Solid     Solid: Within functional limits Presentation: Self Fed     Isobella Ascher B. Rubbie, M.S., CCC-SLP, Tree surgeon Certified Brain Injury Specialist [facility]  [facility] Rehabilitation Services Office [phone] Ascom [phone] Fax [phone]

## 2023-10-24 NOTE — Progress Notes (Signed)
*  PRELIMINARY RESULTS* Echocardiogram 2D Echocardiogram has been performed.  Floydene Harder 10/24/2023, 8:23 AM

## 2023-10-24 NOTE — Hospital Course (Addendum)
 Hospital course / significant events:   HPI: Candace [name] is a 73 y.o. female with medical history significant of BPPV, PAF on Eliquis , HTN, HLD, IDDM, multivessel CAD s/p CABG, tremors on Sinemet  essential tremor with possible superimposed parkinsonism, occipital neuralgia and chronic tension headache, esophageal dysphagia, orthostatic hypotension, chronic combined HFrEF and HFpEF with LVEF 43-51%, OSA on CPAP, presented with multiple complaints of headache, blurry vision, slurred speech and chest pain. She reports she woke up in her usual state of health at 5 AM 07/27. 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 While at [employer] school she began to have blurred vision and slurred speech. 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 However they activated EMS and she was transported to the hospital. She was given 3 tablets of nitroglycerin  as well as 4 baby aspirin  per EMS. On arrival to the ED because of concern for slurred speech code stroke was activated    07/27: to ED. Code stroke and neurology consult - recs CTA H/N and MRI brain non-acute. Did now bilateral vertebral artery stenosis - per vascular surgery there is no indication for procedural intervention on this, per Candace [name] CTA does NOT demonstrate ICA stenoses >50% so no indication for carotid intervention per vascular surgery  07/28: cardiology consult re chest pain and question need further ischemic workup, resume Imdur  and up-titrate as able to 120 mg, no plan for cardiac cath at this time. PT still dizzy / unsteady, PT/OT recs for SNF pt would like to go home, will reattempt in AM and decide for rehab or home health.  07/29: improved today no longer meeting criteria for SNF, still lightheaded and needs  assistance. Daughter will be taking patient home to live w/ her, orders in this afternoon for DME/HH and anticipate discharge in AM.   Consultants:  Neurology  Vascular Surgery  Cardiology   Procedures/Surgeries: none      ASSESSMENT & PLAN:   TIA vs hypotension / BPPV Acute dysarthria, improving Near syncope, persistent orthostatic lightheadedness  Orthostatic hypotension MRI brain negative for acute findings Continue Eliquis , statin PT OT Neurology follow up outpatient  Continue midodrine   Patient has been instructed that she is not to drive - daughter has requested a letter stating such for her records and daughter has taken patient's car keys    Anginal-like chest pain Multivessel CAD status post CABG Chronic stable angina Chronic HFrEF not in acute exacerbation  Echo EF 60-65 and Grade 1 diast df Continue ranolazine  On Eliquis , will not give ASA Per cardiology, up-titrate Imdur  Continue propranolol  for tremor and beta blocker Empagliflozin   Appreciate cardiology recs, cardio has s/o reengage as needed, outpatient follow up  No plan for LHC at this time, especially in setting of TIA as this would be high risk. Patient's atypical chest pain does not appear cardiac in nature. Patient reports improvement to chest pain with antianginal medications. Continue with optimizing antianginal medications at this time. Can consider outpatient LHC if patient has worsening anginal symptoms   PAF Sinus rhythm,  continue Eliquis  and beta blocker    IIDM Hold home diabetic medication SSI   Tremors Suspect parkinsonian syndrom Continue Sinemet , propranolol  (tremors significantly worse without propranolol , gnereally improved on  this + Sinemet )  Follow neurology outpatient       overweight based on BMI: Body mass index is 28.35 kg/m.[name] Significantly low or high BMI is associated with higher medical risk.  Underweight - under 18  overweight - 25 to 29 obese - 30 or  more Class 1 obesity: BMI of 30.0 to 34 Class 2 obesity: BMI of 35.0 to 39 Class 3 obesity: BMI of 40.0 to 49 Super Morbid Obesity: BMI 50-59 Super-super Morbid Obesity: BMI 60+ Healthy nutrition and physical activity advised as adjunct to other disease management and risk reduction treatments    DVT prophylaxis: Eliquis  IV fluids: no continuous IV fluids  Nutrition: cardiac/carb diet  Central lines / other devices: none  Code Status: FULL CODE ACP documentation reviewed:  none on file in [facility]  TOC needs: HH/DME Medical barriers to dispo: echo results. Expected medical readiness for discharge tomorrow.

## 2023-10-24 NOTE — Plan of Care (Signed)
 Problem: Education: Goal: Ability to describe self-care measures that may prevent or decrease complications (Diabetes Survival Skills Education) will improve 10/24/2023 0606 by Bentley Marko DASEN, RN Outcome: Progressing 10/24/2023 0412 by Bentley Marko DASEN, RN Outcome: Progressing Goal: Individualized Educational Video(s) 10/24/2023 0606 by Bentley Marko DASEN, RN Outcome: Progressing 10/24/2023 0412 by Bentley Marko DASEN, RN Outcome: Progressing   Problem: Coping: Goal: Ability to adjust to condition or change in health will improve 10/24/2023 0606 by Bentley Marko DASEN, RN Outcome: Progressing 10/24/2023 0412 by Bentley Marko DASEN, RN Outcome: Progressing   Problem: Fluid Volume: Goal: Ability to maintain a balanced intake and output will improve 10/24/2023 0606 by Bentley Marko DASEN, RN Outcome: Progressing 10/24/2023 0412 by Bentley Marko DASEN, RN Outcome: Progressing   Problem: Health Behavior/Discharge Planning: Goal: Ability to identify and utilize available resources and services will improve 10/24/2023 0606 by Bentley Marko DASEN, RN Outcome: Progressing 10/24/2023 0412 by Bentley Marko DASEN, RN Outcome: Progressing Goal: Ability to manage health-related needs will improve 10/24/2023 0606 by Bentley Marko DASEN, RN Outcome: Progressing 10/24/2023 0412 by Bentley Marko DASEN, RN Outcome: Progressing   Problem: Metabolic: Goal: Ability to maintain appropriate glucose levels will improve 10/24/2023 0606 by Bentley Marko DASEN, RN Outcome: Progressing 10/24/2023 0412 by Bentley Marko DASEN, RN Outcome: Progressing   Problem: Nutritional: Goal: Maintenance of adequate nutrition will improve 10/24/2023 0606 by Bentley Marko DASEN, RN Outcome: Progressing 10/24/2023 0412 by Bentley Marko DASEN, RN Outcome: Progressing Goal: Progress toward achieving an optimal weight will improve 10/24/2023 0606 by Bentley Marko DASEN, RN Outcome:  Progressing 10/24/2023 0412 by Bentley Marko DASEN, RN Outcome: Progressing   Problem: Skin Integrity: Goal: Risk for impaired skin integrity will decrease 10/24/2023 0606 by Bentley Marko DASEN, RN Outcome: Progressing 10/24/2023 0412 by Bentley Marko DASEN, RN Outcome: Progressing   Problem: Tissue Perfusion: Goal: Adequacy of tissue perfusion will improve 10/24/2023 0606 by Bentley Marko DASEN, RN Outcome: Progressing 10/24/2023 0412 by Bentley Marko DASEN, RN Outcome: Progressing   Problem: Education: Goal: Knowledge of disease or condition will improve 10/24/2023 0606 by Bentley Marko DASEN, RN Outcome: Progressing 10/24/2023 0412 by Bentley Marko DASEN, RN Outcome: Progressing Goal: Knowledge of secondary prevention will improve (MUST DOCUMENT ALL) 10/24/2023 0606 by Bentley Marko DASEN, RN Outcome: Progressing 10/24/2023 0412 by Bentley Marko DASEN, RN Outcome: Progressing Goal: Knowledge of patient specific risk factors will improve (DELETE if not current risk factor) 10/24/2023 0606 by Bentley Marko DASEN, RN Outcome: Progressing 10/24/2023 0412 by Bentley Marko DASEN, RN Outcome: Progressing   Problem: Ischemic Stroke/TIA Tissue Perfusion: Goal: Complications of ischemic stroke/TIA will be minimized 10/24/2023 0606 by Bentley Marko DASEN, RN Outcome: Progressing 10/24/2023 0412 by Bentley Marko DASEN, RN Outcome: Progressing   Problem: Coping: Goal: Will verbalize positive feelings about self 10/24/2023 0606 by Bentley Marko DASEN, RN Outcome: Progressing 10/24/2023 0412 by Bentley Marko DASEN, RN Outcome: Progressing Goal: Will identify appropriate support needs 10/24/2023 0606 by Bentley Marko DASEN, RN Outcome: Progressing 10/24/2023 0412 by Bentley Marko DASEN, RN Outcome: Progressing   Problem: Health Behavior/Discharge Planning: Goal: Ability to manage health-related needs will improve 10/24/2023 0606 by Bentley Marko DASEN, RN Outcome:  Progressing 10/24/2023 0412 by Bentley Marko DASEN, RN Outcome: Progressing Goal: Goals will be collaboratively established with patient/family 10/24/2023 0606 by Bentley Marko DASEN, RN Outcome: Progressing 10/24/2023 0412 by Bentley Marko DASEN, RN Outcome: Progressing   Problem: Self-Care: Goal: Ability to participate in self-care as condition permits will improve 10/24/2023 0606 by Bentley Marko DASEN, RN Outcome: Progressing 10/24/2023  [id number] by Bentley Marko DASEN, RN Outcome: Progressing Goal: Verbalization of feelings and concerns over difficulty with self-care will improve 10/24/2023 0606 by Bentley Marko DASEN, RN Outcome: Progressing 10/24/2023 0412 by Bentley Marko DASEN, RN Outcome: Progressing Goal: Ability to communicate needs accurately will improve 10/24/2023 0606 by Bentley Marko DASEN, RN Outcome: Progressing 10/24/2023 0412 by Bentley Marko DASEN, RN Outcome: Progressing   Problem: Nutrition: Goal: Risk of aspiration will decrease 10/24/2023 0606 by Bentley Marko DASEN, RN Outcome: Progressing 10/24/2023 0412 by Bentley Marko DASEN, RN Outcome: Progressing Goal: Dietary intake will improve 10/24/2023 0606 by Bentley Marko DASEN, RN Outcome: Progressing 10/24/2023 0412 by Bentley Marko DASEN, RN Outcome: Progressing   Problem: Education: Goal: Knowledge of General Education information will improve Description: Including pain rating scale, medication(s)/side effects and non-pharmacologic comfort measures 10/24/2023 0606 by Bentley Marko DASEN, RN Outcome: Progressing 10/24/2023 0412 by Bentley Marko DASEN, RN Outcome: Progressing   Problem: Health Behavior/Discharge Planning: Goal: Ability to manage health-related needs will improve 10/24/2023 0606 by Bentley Marko DASEN, RN Outcome: Progressing 10/24/2023 0412 by Bentley Marko DASEN, RN Outcome: Progressing   Problem: Clinical Measurements: Goal: Ability to maintain clinical measurements within  normal limits will improve 10/24/2023 0606 by Bentley Marko DASEN, RN Outcome: Progressing 10/24/2023 0412 by Bentley Marko DASEN, RN Outcome: Progressing Goal: Will remain free from infection 10/24/2023 0606 by Bentley Marko DASEN, RN Outcome: Progressing 10/24/2023 0412 by Bentley Marko DASEN, RN Outcome: Progressing Goal: Diagnostic test results will improve 10/24/2023 0606 by Bentley Marko DASEN, RN Outcome: Progressing 10/24/2023 0412 by Bentley Marko DASEN, RN Outcome: Progressing Goal: Respiratory complications will improve 10/24/2023 0606 by Bentley Marko DASEN, RN Outcome: Progressing 10/24/2023 0412 by Bentley Marko DASEN, RN Outcome: Progressing Goal: Cardiovascular complication will be avoided 10/24/2023 0606 by Bentley Marko DASEN, RN Outcome: Progressing 10/24/2023 0412 by Bentley Marko DASEN, RN Outcome: Progressing   Problem: Activity: Goal: Risk for activity intolerance will decrease 10/24/2023 0606 by Bentley Marko DASEN, RN Outcome: Progressing 10/24/2023 0412 by Bentley Marko DASEN, RN Outcome: Progressing   Problem: Nutrition: Goal: Adequate nutrition will be maintained 10/24/2023 0606 by Bentley Marko DASEN, RN Outcome: Progressing 10/24/2023 0412 by Bentley Marko DASEN, RN Outcome: Progressing   Problem: Coping: Goal: Level of anxiety will decrease 10/24/2023 0606 by Bentley Marko DASEN, RN Outcome: Progressing 10/24/2023 0412 by Bentley Marko DASEN, RN Outcome: Progressing   Problem: Elimination: Goal: Will not experience complications related to bowel motility 10/24/2023 0606 by Bentley Marko DASEN, RN Outcome: Progressing 10/24/2023 0412 by Bentley Marko DASEN, RN Outcome: Progressing Goal: Will not experience complications related to urinary retention 10/24/2023 0606 by Bentley Marko DASEN, RN Outcome: Progressing 10/24/2023 0412 by Bentley Marko DASEN, RN Outcome: Progressing   Problem: Pain Managment: Goal: General experience of  comfort will improve and/or be controlled 10/24/2023 0606 by Bentley Marko DASEN, RN Outcome: Progressing 10/24/2023 0412 by Bentley Marko DASEN, RN Outcome: Progressing   Problem: Safety: Goal: Ability to remain free from injury will improve 10/24/2023 0606 by Bentley Marko DASEN, RN Outcome: Progressing 10/24/2023 0412 by Bentley Marko DASEN, RN Outcome: Progressing   Problem: Skin Integrity: Goal: Risk for impaired skin integrity will decrease 10/24/2023 0606 by Bentley Marko DASEN, RN Outcome: Progressing 10/24/2023 0412 by Bentley Marko DASEN, RN Outcome: Progressing

## 2023-10-24 NOTE — Evaluation (Signed)
 Occupational Therapy Evaluation Patient Details Name: Candace [name] MRN: [id number] DOB: 12-03-1950 Today's Date: 10/24/2023   History of Present Illness   Pt is a 73 y.o. female presenting to hospital 10/23/23 with c/o HA, dizziness, slurring of speech, and developing sense of chest pressure.  Pt admitted with TIA, acute dysarthria, anginal-like chest pain, near syncope (h/o orthostatic hypotension). PMH includes vertigo, a-fib on Eliquis , htn, HLD, DM, COPD, CAD, CABG, diastolic CHF, stroke/TIA, OSA on CPAP, panic attack, carotid artery stenosis, essential tremor with possible superimposed parkinsonism, occipital neuralgia and chronic tension HA, esophageal dysphagia.     Clinical Impressions Patient presenting with decreased Ind in self care,balance, functional mobility/transfers, endurance, and safety awareness. Patient reports being Ind at baseline without use of AD and living at home alonePTA. Patient currently functioning at supervision for bed mobility. Pt ambulates with RW with min guard to bathroom for toileting needs. She is able to perform clothing management and hygiene with min guard and has a LOB at sink while washing hands and while ambulating on the way back to recliner chair requiring mod A to correct. Pt is very verbose this session and talking the entire way back as she is reporting she sees a curtain over her eyes like when people pass out and there is two of you. BP checked once pt seated in recliner chair with results of 147/78(97). Patient will benefit from acute OT to increase overall independence in the areas of ADLs, functional mobility, and safety awareness  in order to safely discharge .     If plan is discharge home, recommend the following:   A little help with walking and/or transfers;A little help with bathing/dressing/bathroom;Assistance with cooking/housework;Help with stairs or ramp for entrance     Functional Status Assessment   Patient has had a  recent decline in their functional status and demonstrates the ability to make significant improvements in function in a reasonable and predictable amount of time.     Equipment Recommendations   Other (comment) (defer to next venue of care)      Precautions/Restrictions   Precautions Precautions: Fall     Mobility Bed Mobility Overal bed mobility: Needs Assistance Bed Mobility: Supine to Sit     Supine to sit: Supervision, HOB elevated          Transfers   Equipment used: Rolling walker (2 wheels) Transfers: Sit to/from Stand Sit to Stand: Contact guard assist, Min assist                  Balance Overall balance assessment: Needs assistance Sitting-balance support: No upper extremity supported, Feet supported Sitting balance-[name] Scale: Good     Standing balance support: Bilateral upper extremity supported, Reliant on assistive device for balance Standing balance-[name] Scale: Fair Standing balance comment: steady static standing with B UE support on RW                           ADL either performed or assessed with clinical judgement   ADL Overall ADL's : Needs assistance/impaired     Grooming: Set up;Contact guard assist;Standing                   Toilet Transfer: Minimal assistance;Ambulation;Regular Toilet;Rolling walker (2 wheels)                   Vision Patient Visual Report: No change from baseline  Pertinent Vitals/Pain Pain Assessment Pain Assessment: No/denies pain     Extremity/Trunk Assessment Upper Extremity Assessment Upper Extremity Assessment: Generalized weakness   Lower Extremity Assessment Lower Extremity Assessment: Generalized weakness       Communication Communication Communication: No apparent difficulties   Cognition Arousal: Alert Behavior During Therapy: WFL for tasks assessed/performed Cognition: No apparent impairments                                Following commands: Intact       Cueing  General Comments   Cueing Techniques: Verbal cues              Home Living Family/patient expects to be discharged to:: Private residence   Available Help at Discharge: Family;Available PRN/intermittently Type of Home: Apartment Home Access: Level entry     Home Layout: One level     Bathroom Shower/Tub: Chief Strategy Officer: Standard     Home Equipment: Agricultural consultant (2 wheels);Cane - single point;Tub bench;Shower seat;BSC/3in1      Lives With: Alone    Prior Functioning/Environment Prior Level of Function : Independent/Modified Independent;Driving             Mobility Comments: Independent with ambulation; pt reports no recent falls. ADLs Comments: Independent with ADL's and IADL's including cooking, cleaning, driving. She reports having equipment but does not use.    OT Problem List: Decreased strength;Impaired balance (sitting and/or standing);Decreased safety awareness;Decreased activity tolerance;Decreased knowledge of use of DME or AE   OT Treatment/Interventions: Self-care/ADL training;Therapeutic exercise;Patient/family education;Energy conservation;Therapeutic activities;DME and/or AE instruction      OT Goals(Current goals can be found in the care plan section)   Acute Rehab OT Goals Patient Stated Goal: to go home OT Goal Formulation: With patient Time For Goal Achievement: 11/07/23 Potential to Achieve Goals: Fair ADL Goals Pt Will Perform Grooming: with supervision;standing Pt Will Perform Lower Body Dressing: with supervision;sit to/from stand Pt Will Transfer to Toilet: with supervision;ambulating Pt Will Perform Toileting - Clothing Manipulation and hygiene: with supervision;sit to/from stand   OT Frequency:  Min 2X/week       AM-PAC OT 6 Clicks Daily Activity     Outcome Measure Help from another person eating meals?: None Help from another person taking care of  personal grooming?: A Little Help from another person toileting, which includes using toliet, bedpan, or urinal?: A Little Help from another person bathing (including washing, rinsing, drying)?: A Little Help from another person to put on and taking off regular upper body clothing?: A Little Help from another person to put on and taking off regular lower body clothing?: A Little 6 Click Score: 19   End of Session Equipment Utilized During Treatment: Rolling walker (2 wheels) Nurse Communication: Mobility status;Precautions  Activity Tolerance: Patient tolerated treatment well Patient left: in bed  OT Visit Diagnosis: Unsteadiness on feet (R26.81);Repeated falls (R29.6)                Time: 8554-8491 OT Time Calculation (min): 23 min Charges:  OT General Charges $OT Visit: 1 Visit OT Evaluation $OT Eval Moderate Complexity: 1 Mod OT Treatments $Self Care/Home Management : 8-22 mins  Izetta Claude, MS, OTR/L , CBIS ascom [phone]  10/24/23, 3:37 PM

## 2023-10-24 NOTE — Progress Notes (Signed)
 PROGRESS NOTE    Candace [name]   FMW:[id number] DOB: [birth year]  DOA: 10/23/2023 Date of Service: 10/24/23 which is hospital day 0  PCP: Bertrum Charlie CROME, MD    Hospital course / significant events:   HPI: Candace [name] is a 73 y.o. female with medical history significant of BPPV, PAF on Eliquis , HTN, HLD, IDDM, multivessel CAD s/p CABG, tremors on Sinemet  essential tremor with possible superimposed parkinsonism, occipital neuralgia and chronic tension headache, esophageal dysphagia, orthostatic hypotension, chronic combined HFrEF and HFpEF with LVEF 43-51%, OSA on CPAP, presented with multiple complaints of headache, blurry vision, slurred speech and chest pain. She reports she woke up in her usual state of health at 5 AM 07/27. 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 While at [employer] she began to have blurred vision and slurred speech. 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 However they activated EMS and she was transported to the hospital. She was given 3 tablets of nitroglycerin  as well as 4 baby aspirin  per EMS. On arrival to the ED because of concern for slurred speech code stroke was activated    07/27: to ED. Code stroke and neurology consult - recs CTA H/N and MRI brain non-acute. Did now bilateral vertebral artery stenosis - per vascular surgery there is no indication for procedural intervention on this, per Dr Laurence CTA does NOT demonstrate ICA stenoses >50% so no indication for carotid intervention per vascular surgery  07/28: cardiology consult re chest pain and question need further ischemic workup, resume Imdur  and up-titrate as able to 120 mg, no plan for cardiac cath at this time. PT still dizzy / unsteady, PT/OT recs for  SNF pt would like to go home, will reattempt in AM and decide for rehab or home health.   Consultants:  Neurology  Vascular Surgery   Procedures/Surgeries: none      ASSESSMENT & PLAN:   TIA vs hypotension / BPPV Acute dysarthria, improving MRI negative for acute findings Continue Eliquis  Allow permissive hypertension Risk factor modification check A1c and lipid panel Echo pending read PT OT and speech evaluation   Anginal-like chest pain Multivessel CAD status post CABG Chronic stable angina Continue ranolazine  On Eliquis , will not give ASA Per cardiology, up-titrate Imdur  as able, no cath planned    Near syncope History of orthostatic hypotension orthostatic vital signs Continue midodrine    Chronic HFrEF and HFpEF Clinically patient appears to have mild dehydration Continue propranolol    PAF Sinus rhythm, continue Eliquis    IIDM Hold all p.o. diabetic medication SSI   Tremors Continue Sinemet        overweight based on BMI: Body mass index is 28.35 kg/m.Candace [name] Significantly low or high BMI is associated with higher medical risk.  Underweight - under 18  overweight - 25 to 29 obese - 30 or more Class 1 obesity: BMI of 30.0 to 34 Class 2 obesity: BMI of 35.0 to 39 Class 3 obesity: BMI of 40.0 to 49 Super Morbid Obesity: BMI 50-59 Super-super Morbid Obesity: BMI 60+ Healthy nutrition and physical activity advised as adjunct to other disease management and risk reduction treatments    DVT prophylaxis: Eliquis  IV fluids: no continuous IV fluids  Nutrition: cardiac/carb  diet  Central lines / other devices: none  Code Status: FULL CODE ACP documentation reviewed:  none on file in [facility]  TOC needs: HH/SNF Medical barriers to dispo: echo results. Expected medical readiness for discharge tomorrow.              Subjective / Brief ROS:  Patient reports still dizzy this afternoon Denies CP/SOB now Pain controlled.  Denies new weakness.   Tolerating diet .  Reports no concerns w/ urination/defecation.   Family Communication: daughter at bedside on AM rounds     Objective Findings:  Vitals:   10/24/23 0403 10/24/23 0822 10/24/23 1209 10/24/23 1623  BP: 126/66 117/61 129/71 (!) 121/58  Pulse: 67 66 76 73  Resp: 18  16 16   Temp: 98 F (36.7 C) 97.8 F (36.6 C) 98.4 F (36.9 C)   TempSrc:   Oral   SpO2: 97% 94% 96% 98%  Weight:      Height:        Intake/Output Summary (Last 24 hours) at 10/24/2023 1728 Last data filed at 10/24/2023 0500 Gross per 24 hour  Intake 852.85 ml  Output --  Net 852.85 ml   Filed Weights   10/23/23 1157  Weight: 70.3 kg    Examination:  Physical Exam       Scheduled Medications:   apixaban   5 mg Oral BID   atorvastatin   80 mg Oral Daily   carbidopa -levodopa   1 tablet Oral TID   empagliflozin   25 mg Oral q morning   ezetimibe   10 mg Oral Daily   insulin  aspart  0-15 Units Subcutaneous TID WC   isosorbide  mononitrate  60 mg Oral Daily   midodrine   2.5 mg Oral BID WC   pantoprazole   20 mg Oral BID   propranolol  ER  60 mg Oral Daily   ranolazine   1,000 mg Oral BID   sertraline   100 mg Oral QHS   sucralfate   1 g Oral QID    Continuous Infusions:  dextrose  5 % and 0.9 % NaCl Stopped (10/24/23 0935)    PRN Medications:  butalbital -acetaminophen -caffeine , hydrALAZINE , morphine  injection, ondansetron  (ZOFRAN ) IV  Antimicrobials from admission:  Anti-infectives (From admission, onward)    None           Data Reviewed:  I have personally reviewed the following...  CBC: Recent Labs  Lab 10/23/23 1200  WBC 6.4  NEUTROABS 3.5  HGB 11.6*  HCT 35.3*  MCV 100.9*  PLT 182   Basic Metabolic Panel: Recent Labs  Lab 10/23/23 1200  NA 140  K 3.9  CL 105  CO2 25  GLUCOSE 107*  BUN 11  CREATININE 0.89  CALCIUM  9.0   GFR: Estimated Creatinine Clearance: 52.5 mL/min (by C-G formula based on SCr of 0.89 mg/dL). Liver Function Tests: Recent Labs   Lab 10/23/23 1200  AST 17  ALT <5  ALKPHOS 100  BILITOT 0.8  PROT 6.7  ALBUMIN  3.4*   No results for input(s): LIPASE, AMYLASE in the last 168 hours. No results for input(s): AMMONIA in the last 168 hours. Coagulation Profile: Recent Labs  Lab 10/23/23 1200  INR 1.5*   Cardiac Enzymes: No results for input(s): CKTOTAL, CKMB, CKMBINDEX, TROPONINI in the last 168 hours. BNP (last 3 results) No results for input(s): PROBNP in the last 8760 hours. HbA1C: Recent Labs    10/23/23 1200  HGBA1C 6.5*   CBG: Recent Labs  Lab 10/23/23 2224 10/24/23 0001 10/24/23 0406 10/24/23 0820 10/24/23 1143  GLUCAP 81  87 96 108* 151*   Lipid Profile: Recent Labs    10/24/23 0154  CHOL 132  HDL 54  LDLCALC 63  TRIG 74  CHOLHDL 2.4   Thyroid  Function Tests: No results for input(s): TSH, T4TOTAL, FREET4, T3FREE, THYROIDAB in the last 72 hours. Anemia Panel: No results for input(s): VITAMINB12, FOLATE, FERRITIN, TIBC, IRON, RETICCTPCT in the last 72 hours. Most Recent Urinalysis On File:     Component Value Date/Time   COLORURINE YELLOW (A) 07/28/2023 1050   APPEARANCEUR CLEAR (A) 07/28/2023 1050   APPEARANCEUR Clear 12/24/2014 0905   LABSPEC 1.030 07/28/2023 1050   LABSPEC 1.030 09/14/2013 2123   PHURINE 7.0 07/28/2023 1050   GLUCOSEU >=500 (A) 07/28/2023 1050   GLUCOSEU Negative 09/14/2013 2123   HGBUR NEGATIVE 07/28/2023 1050   BILIRUBINUR NEGATIVE 07/28/2023 1050   BILIRUBINUR Negative 02/06/2020 1014   BILIRUBINUR Negative 12/24/2014 0905   BILIRUBINUR Negative 09/14/2013 2123   KETONESUR NEGATIVE 07/28/2023 1050   PROTEINUR NEGATIVE 07/28/2023 1050   UROBILINOGEN 0.2 02/06/2020 1014   NITRITE NEGATIVE 07/28/2023 1050   LEUKOCYTESUR TRACE (A) 07/28/2023 1050   LEUKOCYTESUR 3+ 09/14/2013 2123   Sepsis Labs: @LABRCNTIP (procalcitonin:4,lacticidven:4) Microbiology: No results found for this or any previous visit (from the past  240 hours).    Radiology Studies last 3 days: MR BRAIN WO CONTRAST Result Date: 10/23/2023 CLINICAL DATA:  Stroke, follow up.  Headache. EXAM: MRI HEAD WITHOUT CONTRAST TECHNIQUE: Multiplanar, multiecho pulse sequences of the brain and surrounding structures were obtained without intravenous contrast. COMPARISON:  Head CT and CTA 10/23/2023 and MRI 07/28/2023 FINDINGS: Brain: There is no evidence of an acute infarct, intracranial hemorrhage, mass, midline shift, or extra-axial fluid collection. Cerebral volume is within normal limits for age. The ventricles are normal in size. Patchy T2 hyperintensities in the cerebral white matter are similar to the prior MRI and are nonspecific but compatible with moderate chronic small vessel ischemic disease. Chronic lacunar infarcts are again noted in the basal ganglia and deep white matter bilaterally, and there is also an unchanged small chronic cortical infarct in the medial right parietal lobe. Vascular: Major intracranial vascular flow voids are preserved. Skull and upper cervical spine: Unremarkable bone marrow signal. Sinuses/Orbits: Unremarkable orbits. Paranasal sinuses and mastoid air cells are clear. Other: None. IMPRESSION: 1. No acute intracranial abnormality. 2. Moderate chronic small vessel ischemic disease. Electronically Signed   By: Dasie Hamburg M.D.   On: 10/23/2023 15:18   CT ANGIO HEAD NECK W WO CM Result Date: 10/23/2023 CLINICAL DATA:  Neuro deficit, acute, stroke suspected.  Headache. EXAM: CT ANGIOGRAPHY HEAD AND NECK WITH AND WITHOUT CONTRAST TECHNIQUE: Multidetector CT imaging of the head and neck was performed using the standard protocol during bolus administration of intravenous contrast. Multiplanar CT image reconstructions and MIPs were obtained to evaluate the vascular anatomy. Carotid stenosis measurements (when applicable) are obtained utilizing NASCET criteria, using the distal internal carotid diameter as the denominator. RADIATION  DOSE REDUCTION: This exam was performed according to the departmental dose-optimization program which includes automated exposure control, adjustment of the mA and/or kV according to patient size and/or use of iterative reconstruction technique. CONTRAST:  75mL OMNIPAQUE  IOHEXOL  350 MG/ML SOLN COMPARISON:  CTA head and neck 01/01/2022 FINDINGS: CTA NECK FINDINGS Aortic arch: Standard branching with mild atherosclerosis. No significant stenosis of the arch vessel origins. Right carotid system: Patent with calcified plaque about the carotid bifurcation resulting in 55-60% stenosis of the proximal ICA, unchanged. Left carotid system: Patent with scattered calcified and  soft plaque throughout the common carotid artery and about the carotid bifurcation without evidence of a significant stenosis. Vertebral arteries: Patent with the left being slightly dominant. Calcified plaque at both vertebral artery origins resulting in new moderate stenosis bilaterally. Skeleton: Advanced cervical spondylosis. Absent maxillary dentition. Carious right mandibular molar. Other neck: No evidence of cervical lymphadenopathy or mass. Upper chest: No mass or consolidation in the included lung apices. Review of the MIP images confirms the above findings CTA HEAD FINDINGS Anterior circulation: The internal carotid arteries are patent from skull base to carotid termini with calcified plaque resulting in mild right and moderate left cavernous and mild bilateral supraclinoid stenoses, similar to the prior CTA. ACAs and MCAs are patent without evidence of a proximal branch occlusion or significant A1 or M1 stenosis. Branch vessel irregularity is again noted including an unchanged moderate distal left M2 branch vessel stenosis. No aneurysm is identified. Posterior circulation: The intracranial vertebral arteries are patent to the basilar with atherosclerosis in the left V4 segment not resulting in a flow limiting stenosis. The basilar artery is  patent with a severe stenosis proximally which is stable to mildly progressive. There are large right and diminutive or absent left posterior communicating arteries with hypoplasia of the right P1 segment. Both PCAs are patent with branch vessel irregularity but no evidence of a significant proximal stenosis. No aneurysm is identified. Venous sinuses: As permitted by contrast timing, patent. Anatomic variants: Fetal right PCA. Review of the MIP images confirms the above findings IMPRESSION: 1. No large vessel occlusion. 2. Unchanged 55-60% proximal right ICA stenosis. 3. New moderate bilateral vertebral artery origin stenoses. 4. Severe proximal basilar artery stenosis, stable to mildly progressed. 5. Unchanged moderate left and mild right intracranial ICA stenoses. 6.  Aortic Atherosclerosis (ICD10-I70.0). Electronically Signed   By: Dasie Hamburg M.D.   On: 10/23/2023 14:40   CT HEAD CODE STROKE WO CONTRAST Result Date: 10/23/2023 EXAM: CT HEAD WITHOUT 10/23/2023 12:21:24 PM TECHNIQUE: CT of the head was performed without the administration of intravenous contrast. Automated exposure control, iterative reconstruction, and/or weight based adjustment of the mA/kV was utilized to reduce the radiation dose to as low as reasonably achievable. COMPARISON: MR head without contrast 07/28/2023. CT head without contrast 01/10/2023. CLINICAL HISTORY: Neuro deficit, acute, stroke suspected. Code stroke DR Juanell [phone] FINDINGS: BRAIN AND VENTRICLES: No acute intracranial hemorrhage. No mass effect or midline shift. No extra-axial fluid collection. Gray-white differentiation is maintained. No hydrocephalus. Remote lacunar infarcts of the basal ganglia and corona radiata are stable. Periventricular white matter hypoattenuation is moderately advanced for age, stable. ORBITS: No acute abnormality. SINUSES AND MASTOIDS: No acute abnormality. SOFT TISSUES AND SKULL: No acute skull fracture. No acute soft tissue  abnormality. VASCULATURE: Atherosclerotic calcifications are present in the cavernous carotid arteries bilaterally and at the dural margin of both vertebral arteries. No hyperdense vessel is present. [place] stroke program early CT (ASPECT) score ----- Ganglionic (caudate, internal capsule, lentiform nucleus, insula, M1-M3): 7 Supraganglionic (M4-M6): 3 Total: 10 IMPRESSION: 1. No acute intracranial abnormality. 2. Stable remote lacunar infarcts of the basal ganglia and corona radiata. 3. Moderately advanced periventricular white matter hypoattenuation, stable. 4. Atherosclerotic calcifications in the cavernous carotid arteries bilaterally and at the dural margin of both vertebral arteries. No hyperdense vessel is present. Findings were communicated to Dr. Jerrie via the [facility] system at 12:30 pm. Electronically signed by: Lonni Necessary MD 10/23/2023 12:32 PM EDT RP Workstation: [id number]         Laneta Blunt,  DO [facility] 10/24/2023, 5:28 PM    Dictation software may have been used to generate the above note. Typos may occur and escape review in typed/dictated notes. Please contact Dr Marsa directly for clarity if needed.  Staff may message me via secure chat in Epic  but this may not receive an immediate response,  please page me for urgent matters!  If 7PM-7AM, please contact night coverage [email or web address]

## 2023-10-24 NOTE — Evaluation (Signed)
 Physical Therapy Evaluation Patient Details Name: Candace [name] MRN: [id number] DOB: [birth year] Today's Date: 10/24/2023  History of Present Illness  Pt is a 73 y.o. female presenting to hospital 10/23/23 with c/o HA, dizziness, slurring of speech, and developing sense of chest pressure.  Pt admitted with TIA, acute dysarthria, anginal-like chest pain, near syncope (h/o orthostatic hypotension). PMH includes vertigo, a-fib on Eliquis , htn, HLD, DM, COPD, CAD, CABG, diastolic CHF, stroke/TIA, OSA on CPAP, panic attack, carotid artery stenosis, essential tremor with possible superimposed parkinsonism, occipital neuralgia and chronic tension HA, esophageal dysphagia.  Clinical Impression  Prior to recent medical concerns, pt reports being independent with ambulation and driving; lives alone in 1st floor apt (no STE).  Currently pt is SBA to CGA with bed mobility and CGA to stand up to RW.  Attempted to get orthostatic vitals (semi-supine in bed BP 107/57 MAP 73 with HR 69 bpm; sitting BP 125/74 MAP 90 with HR 73 bpm; standing at 0 minutes BP 118/71 with MAP 85 and HR 73 bpm) but after standing for about 1 minute pt c/o whole body weakness and room spinning and needing to lay down so deferred 3 minute standing orthostatic vitals (BP semi-supine in bed 140/72 with MAP 92 and HR 73 bpm and symptoms resolved after a few minutes of rest in bed).  Pt's daughter arrived and pt then reporting having 6/10 stabbing chest pain on/off since 6:30 this morning (pt told therapy at beginning of session she didn't have any pain because she didn't want it to change her ability to eat)--MD then arrived and notified of pt's symptoms, BP/HR noted above, and chest pain; nurse also notified.  Pt would currently benefit from skilled PT to address noted impairments and functional limitations (see below for any additional details).  Upon hospital discharge, pt would benefit from ongoing therapy.     If plan is discharge home,  recommend the following: A little help with walking and/or transfers;A little help with bathing/dressing/bathroom;Assistance with cooking/housework;Assist for transportation   Can travel by private vehicle   Yes    Equipment Recommendations Rolling walker (2 wheels)  Recommendations for Other Services  OT consult    Functional Status Assessment Patient has had a recent decline in their functional status and demonstrates the ability to make significant improvements in function in a reasonable and predictable amount of time.     Precautions / Restrictions Precautions Precautions: Fall Restrictions Weight Bearing Restrictions Per Provider Order: No      Mobility  Bed Mobility Overal bed mobility: Needs Assistance Bed Mobility: Supine to Sit, Sit to Supine     Supine to sit: Supervision, HOB elevated (increased time to perform on own) Sit to supine: Contact guard assist, HOB elevated   General bed mobility comments: 2 assist to boost pt up in bed (using bed pad) end of session    Transfers Overall transfer level: Needs assistance Equipment used: Rolling walker (2 wheels) Transfers: Sit to/from Stand Sit to Stand: Contact guard assist           General transfer comment: fairly strong stand up to RW; CGA for safety sitting d/t pt c/o weakness and dizziness    Ambulation/Gait               General Gait Details: Deferred d/t pt requesting to lay down d/t weakness and dizziness  Stairs            Wheelchair Mobility     Tilt Bed    Modified  Rankin (Stroke Patients Only)       Balance Overall balance assessment: Needs assistance Sitting-balance support: No upper extremity supported, Feet supported Sitting balance-[name] Scale: Good Sitting balance - Comments: steady reaching within BOS   Standing balance support: Bilateral upper extremity supported, Reliant on assistive device for balance Standing balance-[name] Scale: Fair Standing balance  comment: steady static standing with B UE support on RW                             Pertinent Vitals/Pain Pain Assessment Pain Assessment: 0-10 Pain Score: 6  Pain Location: stabbing like chest pain Pain Descriptors / Indicators: Stabbing Pain Intervention(s): Limited activity within patient's tolerance, Monitored during session, Repositioned, Other (comment) (RN and MD notified (MD present in room with pt))    Home Living Family/patient expects to be discharged to:: Private residence Living Arrangements: Alone Available Help at Discharge: Family;Available PRN/intermittently (2 daughters live in [place]; granddaughter lives in [place], granddaughter in [place]; daughter in [place]) Type of Home: Apartment Home Access: Level entry (1st floor)       Home Layout: One level Home Equipment: Agricultural consultant (2 wheels);Cane - single point;Tub bench;Shower seat;BSC/3in1      Prior Function Prior Level of Function : Independent/Modified Independent;Driving             Mobility Comments: Independent with ambulation; pt reports no recent falls. ADLs Comments: Independent with ADL's and IADL's including cooking, cleaning, driving     Extremity/Trunk Assessment   Upper Extremity Assessment Upper Extremity Assessment: Defer to OT evaluation    Lower Extremity Assessment Lower Extremity Assessment: Generalized weakness    Cervical / Trunk Assessment Cervical / Trunk Assessment: Normal  Communication   Communication Communication: No apparent difficulties    Cognition Arousal: Alert Behavior During Therapy: WFL for tasks assessed/performed   PT - Cognitive impairments: No apparent impairments                         Following commands: Intact       Cueing Cueing Techniques: Verbal cues     General Comments  Nursing cleared pt for participation in physical therapy.  Pt agreeable to PT session.    Exercises     Assessment/Plan    PT  Assessment Patient needs continued PT services  PT Problem List Decreased strength;Decreased activity tolerance;Decreased balance;Decreased mobility;Pain       PT Treatment Interventions DME instruction;Gait training;Functional mobility training;Therapeutic activities;Therapeutic exercise;Balance training;Patient/family education    PT Goals (Current goals can be found in the Care Plan section)  Acute Rehab PT Goals Patient Stated Goal: to go home PT Goal Formulation: With patient Time For Goal Achievement: 11/07/23 Potential to Achieve Goals: Good    Frequency Min 3X/week     Co-evaluation               AM-PAC PT 6 Clicks Mobility  Outcome Measure Help needed turning from your back to your side while in a flat bed without using bedrails?: None Help needed moving from lying on your back to sitting on the side of a flat bed without using bedrails?: A Little Help needed moving to and from a bed to a chair (including a wheelchair)?: A Little Help needed standing up from a chair using your arms (e.g., wheelchair or bedside chair)?: A Little Help needed to walk in hospital room?: A Little Help needed climbing 3-5 steps with a railing? :  A Lot 6 Click Score: 18    End of Session Equipment Utilized During Treatment: Gait belt Activity Tolerance: Other (comment) (Limited d/t c/o weakness and dizziness in standing) Patient left: in bed;with call bell/phone within reach;with bed alarm set;with family/visitor present;Other (comment) (MD present) Nurse Communication: Mobility status;Precautions;Other (comment) (Pt's symptoms (weakness and dizziness) and pt's chest pain) PT Visit Diagnosis: Other abnormalities of gait and mobility (R26.89);Muscle weakness (generalized) (M62.81);Pain Pain - Right/Left: Left Pain - part of body:  (chest)    Time: 9082-9056 PT Time Calculation (min) (ACUTE ONLY): 26 min   Charges:   PT Evaluation $PT Eval Low Complexity: 1 Low PT  Treatments $Therapeutic Activity: 8-22 mins PT General Charges $$ ACUTE PT VISIT: 1 Visit        Damien Caulk, PT 10/24/23, 10:16 AM

## 2023-10-24 NOTE — Consult Note (Signed)
 [facility] CLINIC CARDIOLOGY CONSULT NOTE       Patient ID: Candace [name] MRN: [id number] DOB/AGE: Jul 31, 1950 73 y.o.  Admit date: 10/23/2023 Referring Physician Dr. Marsa Primary Physician Bertrum Charlie CROME, MD Primary Cardiologist Dr. Florencio Reason for Consultation angina  HPI: Candace [name] is a 73 y.o. female  with a past medical history of severe 3 vessel coronary artery disease s/p CABG x 4 (atretic LIMA-LAD, occluded SVG to OM, patent SVG to RCA, with -70-80% mid LAD, 60-70% diffuse LCx disease (small vessel) and 90% distal RCA by [facility] 07/11/2020), orthostatic hypotension, hypertension, hyperlipidemia, chronic HFpEF (EF 65-70%, G1 DD 06/2022), paroxysmal atrial fibrillation (Eliquis ), hx TIA, DM2, OSA on CPAP, essential tremor  who presented to the ED on 10/23/2023 for headache, blurry vision, slurred speech and atypical chest pain. Code Stroke was called. CT and MRI negative for acute stroke.  Cardiology was consulted for further evaluation of atypical chest pain.   Work up in the ED notable for sodium 140, potassium 3.9, creatinine 0.89, hemoglobin 11.6, platelets 182.  Troponins negative x 2.  EKG without acute ischemic changes. CT head/MRI brain negative for acute stroke. CTA head/neck with no large vessel occlusion, new mod bilateral vertebral artery stenosis. Neurology and vascular surgery consulted.   At the time of my evaluation this afternoon, patient was resting comfortably in hospital bed.  We discussed patient's symptoms in further detail.  Patient states prior to admission she had an episode of chest tightness however more recently patient describes her chest pain as stabbing feeling that lasts 1-2 second and goes away. Patient states her chest pain is not associated with exertion, only happens sometimes with exertion.  Patient states her chest pain symptoms has improved since she last saw Dr. Florencio at the office on 08/2023. Patient denies palpitations, SOB or  lightheadedness. Patient currently currently she is chest pain free. Patient denies lower extremity swelling. Patient had recent Cardiac PET stress test on 08/2023 that Dr. Florencio reviewed and decided to pursue medical management with optimizing antianginals. Patient states since then her chest pain sxs have improved.    Pertinent Cardiac History (Most recent) 09/15/2023 PET CT   LV perfusion is abnormal. There is abnormal wall motion in the defect area. There is medium size, moderate intensity, reversible defect noted in apical lateral/anterior wall, mid anterolateral/inferolateral wall suggestive of ischemia, SDS 7. Abnormal TID of 1.29 along with drop in stress LVEF noted which can suggest multivessel obstructive coronary artery disease.   Rest EF: 51%. Stress left ventricular function is abnormal. Stress global function is mildly reduced. Stress EF: 43%. End diastolic cavity size is normal. End systolic cavity size is normal.   Myocardial blood flow was computed to be 0.18ml/g/min at rest and 1.40ml/g/min at stress. Global myocardial blood flow reserve was 1.75 and was mildly abnormal.   Coronary calcium  was present on the attenuation correction CT images. Severe coronary calcifications were present. Coronary calcifications were present in the left anterior descending artery, left circumflex artery and right coronary artery distribution(s).   The study is high risk with area of ischemia along with abnormal TID and reduced stress LVEF.  LHC 07/21/2020 1. Severe 3v CAD including 70-80% mid LAD, 60-70% diffuse LCx disease  (small vessel) and 90% distal RCA.   2. Patent SVG to RCA, Occluded SVG to OM (likely culprit), Atretic LIMA to  LAD  3. Normal left ventricular filling pressures (LVEDP = 15  mmHg)   Review of systems complete and found to be  negative unless listed above    Past Medical History:  Diagnosis Date   Allergy    Anemia    Anxiety    Arrhythmia    Arthritis    Atrial  fibrillation (HCC)    CHF (congestive heart failure) (HCC)    COPD (chronic obstructive pulmonary disease) (HCC)    Coronary artery disease    Depression    Diabetes mellitus without complication (HCC)    Dyspnea    doe   Dysrhythmia    GERD (gastroesophageal reflux disease)    Headache    History of hiatal hernia    Hyperlipidemia    Hypertension    Myocardial infarction (HCC)    2016, 04/2017   Myocardial infarction with cardiac rehabilitation [facility])    MI 2016/ CABG 8/17    FINISHED CARDIAC REHAB 3 WEEKS AGO   Panic attack    Pneumonia    Reflux    Stroke (HCC) 2015   showed up on MRI; no weakness noted   TIA (transient ischemic attack)    Voice tremor     Past Surgical History:  Procedure Laterality Date   ABDOMINAL HYSTERECTOMY     APPENDECTOMY  1975   ARTERY BIOPSY Right 04/26/2016   Procedure: BIOPSY TEMPORAL ARTERY;  Surgeon: Deward Argue, MD;  Location: [facility] ORS;  Service: ENT;  Laterality: Right;   CARDIAC CATHETERIZATION N/A 11/06/2015   Procedure: Left Heart Cath and Coronary Angiography;  Surgeon: Wolm JINNY Rhyme, MD;  Location: [facility] INVASIVE CV LAB;  Service: Cardiovascular;  Laterality: N/A;   CESAREAN SECTION     COLONOSCOPY  2015   COLONOSCOPY WITH PROPOFOL  N/A 11/03/2018   Procedure: COLONOSCOPY WITH PROPOFOL ;  Surgeon: Janalyn Keene NOVAK, MD;  Location: [facility] ENDOSCOPY;  Service: Endoscopy;  Laterality: N/A;   CORONARY ANGIOPLASTY  04/2017   [facility]   CORONARY ARTERY BYPASS GRAFT N/A 11/10/2015   Procedure: CORONARY ARTERY BYPASS GRAFTING (CABG), ON PUMP, TIMES FOUR, USING LEFT INTERNAL MAMMARY ARTERY, BILATERAL GREATER SAPHENOUS VEINS HARVESTED ENDOSCOPICALLY;  Surgeon: Dallas NOVAK Jude, MD;  Location: [facility] OR;  Service: Open Heart Surgery;  Laterality: N/A;  LIMA-LAD; SEQ SVG-OM1-OM2; SVG-PL   CORONARY STENT INTERVENTION N/A 08/05/2016   Procedure: Coronary Stent Intervention;  Surgeon: Ammon Blunt, MD;  Location: [facility] INVASIVE CV LAB;   Service: Cardiovascular;  Laterality: N/A;   ESOPHAGOGASTRODUODENOSCOPY (EGD) WITH PROPOFOL  N/A 11/03/2018   Procedure: ESOPHAGOGASTRODUODENOSCOPY (EGD) WITH PROPOFOL ;  Surgeon: Janalyn Keene NOVAK, MD;  Location: [facility] ENDOSCOPY;  Service: Endoscopy;  Laterality: N/A;   HYSTERECTOMY ABDOMINAL WITH SALPINGO-OOPHORECTOMY Bilateral 08/15/2017   Procedure: HYSTERECTOMY ABDOMINAL WITH BILATERAL SALPINGO-OOPHORECTOMY;  Surgeon: Connell Davies, MD;  Location: [facility] ORS;  Service: Gynecology;  Laterality: Bilateral;   LEFT HEART CATH AND CORONARY ANGIOGRAPHY N/A 08/05/2016   Procedure: Left Heart Cath and Coronary Angiography;  Surgeon: Ammon Blunt, MD;  Location: [facility] INVASIVE CV LAB;  Service: Cardiovascular;  Laterality: N/A;   LEFT HEART CATH AND CORS/GRAFTS ANGIOGRAPHY N/A 09/19/2018   Procedure: LEFT HEART CATH AND CORS/GRAFTS ANGIOGRAPHY;  Surgeon: Rhyme Wolm JINNY, MD;  Location: [facility] INVASIVE CV LAB;  Service: Cardiovascular;  Laterality: N/A;   LEFT HEART CATH AND CORS/GRAFTS ANGIOGRAPHY N/A 08/30/2019   Procedure: LEFT HEART CATH AND CORS/GRAFTS ANGIOGRAPHY;  Surgeon: Bosie Vinie LABOR, MD;  Location: [facility] INVASIVE CV LAB;  Service: Cardiovascular;  Laterality: N/A;   OOPHORECTOMY     TEE WITHOUT CARDIOVERSION N/A 11/10/2015   Procedure: TRANSESOPHAGEAL ECHOCARDIOGRAM (TEE);  Surgeon: Dallas NOVAK Jude, MD;  [facility];  Service: Open Heart Surgery;  Laterality: N/A;   TUBAL LIGATION      Medications Prior to Admission  Medication Sig Dispense Refill Last Dose/Taking   acetaminophen  (TYLENOL ) 325 MG tablet Take 650 mg by mouth every 6 (six) hours as needed for moderate pain or headache.    Unknown   albuterol  (VENTOLIN  HFA) 108 (90 Base) MCG/ACT inhaler Inhale 2 puffs into the lungs every 6 (six) hours as needed for wheezing or shortness of breath. 8 g 2 Unknown   atorvastatin  (LIPITOR ) 80 MG tablet Take 1 tablet (80 mg total) by mouth daily. 30 tablet 0 10/23/2023 Morning    carbidopa -levodopa  (SINEMET  IR) 10-100 MG tablet Take 1 tablet by mouth 3 (three) times daily.   10/23/2023 Morning   Dulaglutide  (TRULICITY ) 1.5 MG/0.5ML SOPN Inject 1.5 mg into the skin once a week. Typically takes Weds, ok to administer 01/14/23 has not had it this week, can resume weekly on Fridays after today's dose   10/20/2023   ELIQUIS  5 MG TABS tablet Take 5 mg by mouth 2 (two) times daily.   10/23/2023 Morning   ezetimibe  (ZETIA ) 10 MG tablet Take 1 tablet (10 mg total) by mouth daily. 90 tablet 1 10/23/2023 Morning   insulin  glargine (LANTUS  SOLOSTAR) 100 UNIT/ML Solostar Pen Inject 12 Units into the skin at bedtime. 18 mL 3 10/22/2023   isosorbide  mononitrate (IMDUR ) 120 MG 24 hr tablet Take 1 tablet (120 mg total) by mouth daily. 30 tablet 0 10/23/2023 Morning   JARDIANCE  10 MG TABS tablet Take 25 mg by mouth every morning.   10/23/2023 Morning   midodrine  (PROAMATINE ) 2.5 MG tablet Take 2.5 mg by mouth 2 (two) times daily with a meal.   10/23/2023 Morning   ondansetron  (ZOFRAN -ODT) 8 MG disintegrating tablet Take 8 mg by mouth every 8 (eight) hours as needed.   Unknown   pantoprazole  (PROTONIX ) 20 MG tablet Take 1 tablet (20 mg total) by mouth 2 (two) times daily. 60 tablet 11 10/23/2023 Morning   propranolol  ER (INDERAL  LA) 60 MG 24 hr capsule Take 60 mg by mouth daily.   10/23/2023 Morning   ranolazine  (RANEXA ) 1000 MG SR tablet Take 1 tablet (1,000 mg total) by mouth 2 (two) times daily. 60 tablet 0 10/23/2023 Morning   sertraline  (ZOLOFT ) 100 MG tablet Take 1 tablet (100 mg total) by mouth at bedtime.   10/22/2023 Bedtime   sucralfate  (CARAFATE ) 1 g tablet Take 1 g by mouth 4 (four) times daily.   10/23/2023 Morning   Blood Glucose Monitoring Suppl (ONETOUCH VERIO) w/Device KIT Use daily to check blood sugar 1 kit 0    COMFORT EZ PEN NEEDLES 32G X 4 MM MISC Use to inject insulin  daily 100 each 1    glucose blood test strip Use to check blood sugars daily as instructed 100 each 12    Lancets  (ONETOUCH DELICA PLUS LANCET33G) MISC USE UP TO 4 TIMES DAILY AS DIRECTED 100 each 0    nitroGLYCERIN  (NITROSTAT ) 0.4 MG SL tablet Place 1 tablet (0.4 mg total) under the tongue every 5 (five) minutes as needed for chest pain. (Patient not taking: Reported on 07/29/2023) 30 tablet 0    Social History   Socioeconomic History   Marital status: Divorced    Spouse name: Not on file   Number of children: 3   Years of education: Not on file   Highest education level: Some college, no degree  Occupational History   Occupation: retired  Tobacco Use   Smoking status: Former    Current packs/day: 0.00    Types: Cigarettes    Quit date: 10/07/2001    Years since quitting: 22.0   Smokeless tobacco: Never  Vaping Use   Vaping status: Never Used  Substance and Sexual Activity   Alcohol use: No    Alcohol/week: 0.0 standard drinks of alcohol   Drug use: No   Sexual activity: Not Currently  Other Topics Concern   Not on file  Social History Narrative   Lives at home alone   Social Drivers of Health   Financial Resource Strain: Low Risk  (01/26/2023)   Received from [facility]   Overall Financial Resource Strain (CARDIA)    Difficulty of Paying Living Expenses: Not hard at all  Food Insecurity: No Food Insecurity (10/23/2023)   Hunger Vital Sign    Worried About Running Out of Food in the Last Year: Never true    Ran Out of Food in the Last Year: Never true  Transportation Needs: No Transportation Needs (10/23/2023)   PRAPARE - Administrator, Civil Service (Medical): No    Lack of Transportation (Non-Medical): No  Physical Activity: Inactive (01/26/2023)   Received from [facility]   Exercise Vital Sign    On average, how many days per week do you engage in moderate to strenuous exercise (like a brisk walk)?: 0 days    On average, how many minutes do you engage in exercise at this level?: 0 min  Stress: No Stress Concern Present  (03/26/2022)   [employer] of Occupational Health - Occupational Stress Questionnaire    Feeling of Stress : Not at all  Social Connections: Moderately Isolated (10/23/2023)   Social Connection and Isolation Panel    Frequency of Communication with Friends and Family: More than three times a week    Frequency of Social Gatherings with Friends and Family: Twice a week    Attends Religious Services: More than 4 times per year    Active Member of Clubs or Organizations: No    Attends Banker Meetings: Never    Marital Status: Divorced  Catering manager Violence: Not At Risk (10/23/2023)   Humiliation, Afraid, Rape, and Kick questionnaire    Fear of Current or Ex-Partner: No    Emotionally Abused: No    Physically Abused: No    Sexually Abused: No    Family History  Problem Relation Age of Onset   Cancer Father    Hypertension Father    Heart disease Father    Asthma Father    Cancer Mother    Hypertension Mother    Pancreatic cancer Mother 61   Cancer Sister    Breast cancer Sister 30   Breast cancer Sister 48   Lung cancer Brother    Pancreatic cancer Sister 37   Cancer Sister      Vitals:   10/23/23 2351 10/24/23 0403 10/24/23 0403 10/24/23 0822  BP: 118/64 126/66 126/66 117/61  Pulse: 62 66 67 66  Resp: 17 18 18    Temp: 97.6 F (36.4 C) 98 F (36.7 C) 98 F (36.7 C) 97.8 F (36.6 C)  TempSrc:      SpO2: 96% 97% 97% 94%  Weight:      Height:        PHYSICAL EXAM General: Chronically ill-appearing elderly female, well nourished, in no acute distress. HEENT: Normocephalic and atraumatic. Neck: No JVD.   Lungs: Normal  respiratory effort on room air. Clear bilaterally to auscultation. No wheezes, crackles, rhonchi.  Heart: HRRR. Normal S1 and S2 without gallops or murmurs.  Abdomen: Non-distended appearing.  Msk: Normal strength and tone for age. Extremities: Warm and well perfused. No clubbing, cyanosis, edema.  Neuro: Alert and oriented X  3. Psych: Answers questions appropriately.   Labs: Basic Metabolic Panel: Recent Labs    10/23/23 1200  NA 140  K 3.9  CL 105  CO2 25  GLUCOSE 107*  BUN 11  CREATININE 0.89  CALCIUM  9.0   Liver Function Tests: Recent Labs    10/23/23 1200  AST 17  ALT <5  ALKPHOS 100  BILITOT 0.8  PROT 6.7  ALBUMIN  3.4*   No results for input(s): LIPASE, AMYLASE in the last 72 hours. CBC: Recent Labs    10/23/23 1200  WBC 6.4  NEUTROABS 3.5  HGB 11.6*  HCT 35.3*  MCV 100.9*  PLT 182   Cardiac Enzymes: Recent Labs    10/23/23 1200 10/23/23 1413  TROPONINIHS 6 5   BNP: No results for input(s): BNP in the last 72 hours. D-Dimer: No results for input(s): DDIMER in the last 72 hours. Hemoglobin A1C: Recent Labs    10/23/23 1200  HGBA1C 6.5*   Fasting Lipid Panel: Recent Labs    10/24/23 0154  CHOL 132  HDL 54  LDLCALC 63  TRIG 74  CHOLHDL 2.4   Thyroid  Function Tests: No results for input(s): TSH, T4TOTAL, T3FREE, THYROIDAB in the last 72 hours.  Invalid input(s): FREET3 Anemia Panel: No results for input(s): VITAMINB12, FOLATE, FERRITIN, TIBC, IRON, RETICCTPCT in the last 72 hours.   Radiology: MR BRAIN WO CONTRAST Result Date: 10/23/2023 CLINICAL DATA:  Stroke, follow up.  Headache. EXAM: MRI HEAD WITHOUT CONTRAST TECHNIQUE: Multiplanar, multiecho pulse sequences of the brain and surrounding structures were obtained without intravenous contrast. COMPARISON:  Head CT and CTA 10/23/2023 and MRI 07/28/2023 FINDINGS: Brain: There is no evidence of an acute infarct, intracranial hemorrhage, mass, midline shift, or extra-axial fluid collection. Cerebral volume is within normal limits for age. The ventricles are normal in size. Patchy T2 hyperintensities in the cerebral white matter are similar to the prior MRI and are nonspecific but compatible with moderate chronic small vessel ischemic disease. Chronic lacunar infarcts are again noted  in the basal ganglia and deep white matter bilaterally, and there is also an unchanged small chronic cortical infarct in the medial right parietal lobe. Vascular: Major intracranial vascular flow voids are preserved. Skull and upper cervical spine: Unremarkable bone marrow signal. Sinuses/Orbits: Unremarkable orbits. Paranasal sinuses and mastoid air cells are clear. Other: None. IMPRESSION: 1. No acute intracranial abnormality. 2. Moderate chronic small vessel ischemic disease. Electronically Signed   By: Dasie Hamburg M.D.   On: 10/23/2023 15:18   CT ANGIO HEAD NECK W WO CM Result Date: 10/23/2023 CLINICAL DATA:  Neuro deficit, acute, stroke suspected.  Headache. EXAM: CT ANGIOGRAPHY HEAD AND NECK WITH AND WITHOUT CONTRAST TECHNIQUE: Multidetector CT imaging of the head and neck was performed using the standard protocol during bolus administration of intravenous contrast. Multiplanar CT image reconstructions and MIPs were obtained to evaluate the vascular anatomy. Carotid stenosis measurements (when applicable) are obtained utilizing NASCET criteria, using the distal internal carotid diameter as the denominator. RADIATION DOSE REDUCTION: This exam was performed according to the departmental dose-optimization program which includes automated exposure control, adjustment of the mA and/or kV according to patient size and/or use of iterative reconstruction technique. CONTRAST:  75mL OMNIPAQUE  IOHEXOL  350 MG/ML SOLN COMPARISON:  CTA head and neck 01/01/2022 FINDINGS: CTA NECK FINDINGS Aortic arch: Standard branching with mild atherosclerosis. No significant stenosis of the arch vessel origins. Right carotid system: Patent with calcified plaque about the carotid bifurcation resulting in 55-60% stenosis of the proximal ICA, unchanged. Left carotid system: Patent with scattered calcified and soft plaque throughout the common carotid artery and about the carotid bifurcation without evidence of a significant stenosis.  Vertebral arteries: Patent with the left being slightly dominant. Calcified plaque at both vertebral artery origins resulting in new moderate stenosis bilaterally. Skeleton: Advanced cervical spondylosis. Absent maxillary dentition. Carious right mandibular molar. Other neck: No evidence of cervical lymphadenopathy or mass. Upper chest: No mass or consolidation in the included lung apices. Review of the MIP images confirms the above findings CTA HEAD FINDINGS Anterior circulation: The internal carotid arteries are patent from skull base to carotid termini with calcified plaque resulting in mild right and moderate left cavernous and mild bilateral supraclinoid stenoses, similar to the prior CTA. ACAs and MCAs are patent without evidence of a proximal branch occlusion or significant A1 or M1 stenosis. Branch vessel irregularity is again noted including an unchanged moderate distal left M2 branch vessel stenosis. No aneurysm is identified. Posterior circulation: The intracranial vertebral arteries are patent to the basilar with atherosclerosis in the left V4 segment not resulting in a flow limiting stenosis. The basilar artery is patent with a severe stenosis proximally which is stable to mildly progressive. There are large right and diminutive or absent left posterior communicating arteries with hypoplasia of the right P1 segment. Both PCAs are patent with branch vessel irregularity but no evidence of a significant proximal stenosis. No aneurysm is identified. Venous sinuses: As permitted by contrast timing, patent. Anatomic variants: Fetal right PCA. Review of the MIP images confirms the above findings IMPRESSION: 1. No large vessel occlusion. 2. Unchanged 55-60% proximal right ICA stenosis. 3. New moderate bilateral vertebral artery origin stenoses. 4. Severe proximal basilar artery stenosis, stable to mildly progressed. 5. Unchanged moderate left and mild right intracranial ICA stenoses. 6.  Aortic Atherosclerosis  (ICD10-I70.0). Electronically Signed   By: Dasie Hamburg M.D.   On: 10/23/2023 14:40   CT HEAD CODE STROKE WO CONTRAST Result Date: 10/23/2023 EXAM: CT HEAD WITHOUT 10/23/2023 12:21:24 PM TECHNIQUE: CT of the head was performed without the administration of intravenous contrast. Automated exposure control, iterative reconstruction, and/or weight based adjustment of the mA/kV was utilized to reduce the radiation dose to as low as reasonably achievable. COMPARISON: MR head without contrast 07/28/2023. CT head without contrast 01/10/2023. CLINICAL HISTORY: Neuro deficit, acute, stroke suspected. Code stroke DR Juanell [phone] FINDINGS: BRAIN AND VENTRICLES: No acute intracranial hemorrhage. No mass effect or midline shift. No extra-axial fluid collection. Gray-white differentiation is maintained. No hydrocephalus. Remote lacunar infarcts of the basal ganglia and corona radiata are stable. Periventricular white matter hypoattenuation is moderately advanced for age, stable. ORBITS: No acute abnormality. SINUSES AND MASTOIDS: No acute abnormality. SOFT TISSUES AND SKULL: No acute skull fracture. No acute soft tissue abnormality. VASCULATURE: Atherosclerotic calcifications are present in the cavernous carotid arteries bilaterally and at the dural margin of both vertebral arteries. No hyperdense vessel is present. [place] stroke program early CT (ASPECT) score ----- Ganglionic (caudate, internal capsule, lentiform nucleus, insula, M1-M3): 7 Supraganglionic (M4-M6): 3 Total: 10 IMPRESSION: 1. No acute intracranial abnormality. 2. Stable remote lacunar infarcts of the basal ganglia and corona radiata. 3. Moderately advanced periventricular white matter hypoattenuation,  stable. 4. Atherosclerotic calcifications in the cavernous carotid arteries bilaterally and at the dural margin of both vertebral arteries. No hyperdense vessel is present. Findings were communicated to Dr. Jerrie via the [facility] system at 12:30 pm.  Electronically signed by: Lonni Necessary MD 10/23/2023 12:32 PM EDT RP Workstation: [id number]    ECHO pending  TELEMETRY reviewed by me 10/24/2023: Sinus rhythm, rate 70s  EKG reviewed by me: sinus rhythm, LBBB, rate 76 bpm (unchanged from prior EKG)  Data reviewed by me 10/24/2023: last 24h vitals tele labs imaging I/O ED provider note, admission H&P.  Principal Problem:   TIA (transient ischemic attack) Active Problems:   Atypical chest pain   Dysarthria    ASSESSMENT AND PLAN:  Candace [name] is a 73 y.o. female  with a past medical history of severe 3 vessel coronary artery disease s/p CABG x 4 (atretic LIMA-LAD, occluded SVG to OM, patent SVG to RCA, with -70-80% mid LAD, 60-70% diffuse LCx disease (small vessel) and 90% distal RCA by [facility] 07/11/2020), hypertension, hyperlipidemia, orthostatic hypotension, chronic HFpEF (EF 65-70%, G1 DD 06/2022), paroxysmal atrial fibrillation (Eliquis ), hx TIA, DM2, OSA on CPAP, essential tremor  who presented to the ED on 10/23/2023 for headache, blurry vision, slurred speech and atypical chest pain. Code Stroke was called. CT and MRI negative for acute stroke.  Cardiology was consulted for further evaluation of atypical chest pain.   # Atypical chest pain # Coronary artery disease s/p CABG x4 # Hypertension # Hyperlipidemia # Orthostatic hypotension Troponins negative x 2.  EKG without acute ischemic changes. Recent Cardiac PET stress test on 08/2023 that Dr. Florencio reviewed and decided to pursue medical management with optimizing antianginals. Patient states since then her chest pain sxs have improved. Patients current intermittent chest pain does not appear cardiac in nature.  -Continue home atorvastatin  80 mg, Zetia  10 mg daily. -Continue Ranexa  1000 mg BID.  -Continue home midodrine  2.5 mg BID due to orthostatic hypotension. Wean when able. MAP > 65.  -Resume Imdur  at low dose 60 mg (held initially due to soft BP). Uptitarte to home dose  of 120 mg when BP allows.  -On propanolol for tremors.  -Patient hasn't been on antihypertensives due to softer BP.  -No plan for LHC at this time, especially in setting of TIA as this would be high risk.  Patient's atypical chest pain does not appear cardiac in nature. Patient reports improvement to chest pain with antianginal medications. Continue with optimizing antianginal medications at this time. Can consider outpatient LHC if patient has worsening anginal symptoms.  # Chronic HFpEF Patient without SOB, [name]. Patient appears near euvolemia.   -Resume home empagliflozin  10 mg daily.   # Paroxsymal atrial fibrillation EKG in ED in sinus rhythm, per tele sinus rhythm with controlled rates.  -Continue Eliquis  5 mg twice daily for stroke reduction. -Rate control medications limited by lower BP.  # TIA, hx TIA CT head/MRI brain negative for acute stroke. CTA head/neck with no large vessel occlusion, new mod bilateral vertebral artery stenosis. -Neurology consulted and following. -Vascular surgery consulted and following.   This patient's plan of care was discussed and created with Dr. Wilburn and he is in agreement.  Signed: Dorene Comfort, PA-C  10/24/2023, 10:47 AM [facility] Card[facility]

## 2023-10-25 DIAGNOSIS — G459 Transient cerebral ischemic attack, unspecified: Secondary | ICD-10-CM | POA: Diagnosis not present

## 2023-10-25 LAB — CBC
HCT: 37.4 % (ref 36.0–46.0)
Hemoglobin: 11.9 g/dL — ABNORMAL LOW (ref 12.0–15.0)
MCH: 32.6 pg (ref 26.0–34.0)
MCHC: 31.8 g/dL (ref 30.0–36.0)
MCV: 102.5 fL — ABNORMAL HIGH (ref 80.0–100.0)
Platelets: 187 K/uL (ref 150–400)
RBC: 3.65 MIL/uL — ABNORMAL LOW (ref 3.87–5.11)
RDW: 12.5 % (ref 11.5–15.5)
WBC: 4.7 K/uL (ref 4.0–10.5)
nRBC: 0 % (ref 0.0–0.2)

## 2023-10-25 LAB — BASIC METABOLIC PANEL WITH GFR
Anion gap: 8 (ref 5–15)
BUN: 10 mg/dL (ref 8–23)
CO2: 28 mmol/L (ref 22–32)
Calcium: 9.1 mg/dL (ref 8.9–10.3)
Chloride: 106 mmol/L (ref 98–111)
Creatinine, Ser: 0.88 mg/dL (ref 0.44–1.00)
GFR, Estimated: >60 mL/min (ref >60)
Glucose, Bld: 142 mg/dL — ABNORMAL HIGH (ref 70–99)
Potassium: 3.8 mmol/L (ref 3.5–5.1)
Sodium: 142 mmol/L (ref 135–145)

## 2023-10-25 LAB — GLUCOSE, CAPILLARY
Glucose-Capillary: 116 mg/dL — ABNORMAL HIGH (ref 70–99)
Glucose-Capillary: 149 mg/dL — ABNORMAL HIGH (ref 70–99)
Glucose-Capillary: 123 mg/dL — ABNORMAL HIGH (ref 70–99)
Glucose-Capillary: 127 mg/dL — ABNORMAL HIGH (ref 70–99)
Glucose-Capillary: 213 mg/dL — ABNORMAL HIGH (ref 70–99)

## 2023-10-25 MED ORDER — ISOSORBIDE MONONITRATE ER 30 MG PO TB24
120.0000 mg | ORAL_TABLET | Freq: Every day | ORAL | Status: DC
  Administered 2023-10-25 – 2023-10-26 (×2): 120 mg via ORAL
  Filled 2023-10-25 (×2): qty 4

## 2023-10-25 NOTE — Progress Notes (Signed)
 Physical Therapy Treatment Patient Details Name: Candace [name] MRN: [id number] DOB: 1951-03-13 Today's Date: 10/25/2023   History of Present Illness Pt is a 73 y.o. female presenting to hospital 10/23/23 with c/o HA, dizziness, slurring of speech, and developing sense of chest pressure.  Pt admitted with TIA, acute dysarthria, anginal-like chest pain, near syncope (h/o orthostatic hypotension). PMH includes vertigo, a-fib on Eliquis , htn, HLD, DM, COPD, CAD, CABG, diastolic CHF, stroke/TIA, OSA on CPAP, panic attack, carotid artery stenosis, essential tremor with possible superimposed parkinsonism, occipital neuralgia and chronic tension HA, esophageal dysphagia.    PT Comments  Pt resting in recliner upon PT arrival; pt agreeable to therapy.  Pt reports no dizziness ambulating to bathroom with walker today.  During session pt was SBA with transfers and CGA to ambulate 200 feet with RW use (chair follow for safety).  Pt initially denying any symptoms during ambulation but appearing symptomatic so therapist had pt stop walking and sit in recliner; pt then reporting she was having gradually increasing dizziness during ambulation (vertigo where environment was spinning and lightheadedness).  Pre-ambulation BP 130/70 with MAP 88 and HR 75 bpm; post ambulation (pt c/o symptoms still in sitting) BP 152/82 with MAP 104 and HR 77 bpm; attempted to get BP in standing but pt unable to maintain standing d/t c/o therapist spinning (vertigo) and needing to sit down (BP 145/75 with MAP 95 and HR 75 bpm in sitting).  Nurse present and aware of pt's symptoms and vitals.  No nystagmus noted.  Of note, pt with h/o vertigo.  Pt then c/o 7/10 headache (nurse notified of pt's request for pain medication).  Pt progressing with functional mobility in general (PT recommendations updated to reflect progress) but limited d/t c/o dizziness with activity.   If plan is discharge home, recommend the following: A little help with  walking and/or transfers;A little help with bathing/dressing/bathroom;Assistance with cooking/housework;Assist for transportation   Can travel by private vehicle     Yes  Equipment Recommendations  Rolling walker (2 wheels)    Recommendations for Other Services       Precautions / Restrictions Precautions Precautions: Fall Recall of Precautions/Restrictions: Intact Restrictions Weight Bearing Restrictions Per Provider Order: No     Mobility  Bed Mobility               General bed mobility comments: Deferred (pt sitting in recliner beginning/end of session at rest)    Transfers Overall transfer level: Needs assistance Equipment used: Rolling walker (2 wheels) Transfers: Sit to/from Stand Sit to Stand: Supervision           General transfer comment: x2 trials standing from recliner; fairly strong stand noted    Ambulation/Gait Ambulation/Gait assistance: Contact guard assist Gait Distance (Feet): 200 Feet Assistive device: Rolling walker (2 wheels)   Gait velocity: decreased     General Gait Details: limited d/t dizziness (although pt did not initially admit to any symptoms but appearing symptomatic; pt then verbalizing dizziness/vertigo symptoms)   Stairs             Wheelchair Mobility     Tilt Bed    Modified Rankin (Stroke Patients Only)       Balance Overall balance assessment: Needs assistance Sitting-balance support: No upper extremity supported, Feet supported Sitting balance-[name] Scale: Good Sitting balance - Comments: steady reaching within BOS   Standing balance support: Bilateral upper extremity supported, Reliant on assistive device for balance Standing balance-[name] Scale: Good Standing balance comment: steady ambulating with  RW use                            Communication Communication Communication: No apparent difficulties  Cognition Arousal: Alert Behavior During Therapy: WFL for tasks  assessed/performed   PT - Cognitive impairments: No apparent impairments                         Following commands: Intact      Cueing Cueing Techniques: Verbal cues  Exercises      General Comments  Nursing cleared pt for participation in physical therapy.  Pt agreeable to PT session.      Pertinent Vitals/Pain Pain Assessment Pain Assessment: 0-10 Pain Score: 7  Pain Location: headache Pain Descriptors / Indicators: Headache Pain Intervention(s): Limited activity within patient's tolerance, Monitored during session, Repositioned, Patient requesting pain meds-RN notified    Home Living                          Prior Function            PT Goals (current goals can now be found in the care plan section) Acute Rehab PT Goals Patient Stated Goal: to go home PT Goal Formulation: With patient Time For Goal Achievement: 11/07/23 Potential to Achieve Goals: Good Progress towards PT goals: Progressing toward goals    Frequency    Min 3X/week      PT Plan      Co-evaluation              AM-PAC PT 6 Clicks Mobility   Outcome Measure  Help needed turning from your back to your side while in a flat bed without using bedrails?: None Help needed moving from lying on your back to sitting on the side of a flat bed without using bedrails?: A Little Help needed moving to and from a bed to a chair (including a wheelchair)?: A Little Help needed standing up from a chair using your arms (e.g., wheelchair or bedside chair)?: A Little Help needed to walk in hospital room?: A Little Help needed climbing 3-5 steps with a railing? : A Little 6 Click Score: 19    End of Session Equipment Utilized During Treatment: Gait belt Activity Tolerance: Other (comment) (limited d/t c/o dizziness) Patient left: in chair;with call bell/phone within reach;with chair alarm set Nurse Communication: Mobility status;Patient requests pain meds;Precautions;Other  (comment) (pt's symptoms and BP during session) PT Visit Diagnosis: Other abnormalities of gait and mobility (R26.89);Muscle weakness (generalized) (M62.81);Pain     Time: 8665-8592 PT Time Calculation (min) (ACUTE ONLY): 33 min  Charges:    $Gait Training: 8-22 mins $Therapeutic Activity: 8-22 mins PT General Charges $$ ACUTE PT VISIT: 1 Visit                     Damien Caulk, PT 10/25/23, 2:54 PM

## 2023-10-25 NOTE — Progress Notes (Signed)
 PROGRESS NOTE    SAYLA GOLONKA   FMW:[id number] DOB: [birth year]  DOA: 10/23/2023 Date of Service: 10/25/23 which is hospital day 0  PCP: Bertrum Charlie CROME, MD    Hospital course / significant events:   HPI: KHRYSTAL JEANMARIE is a 73 y.o. female with medical history significant of BPPV, PAF on Eliquis , HTN, HLD, IDDM, multivessel CAD s/p CABG, tremors on Sinemet  essential tremor with possible superimposed parkinsonism, occipital neuralgia and chronic tension headache, esophageal dysphagia, orthostatic hypotension, chronic combined HFrEF and HFpEF with LVEF 43-51%, OSA on CPAP, presented with multiple complaints of headache, blurry vision, slurred speech and chest pain. She reports she woke up in her usual state of health at 5 AM 07/27. 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 While at [employer] she began to have blurred vision and slurred speech. 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 However they activated EMS and she was transported to the hospital. She was given 3 tablets of nitroglycerin  as well as 4 baby aspirin  per EMS. On arrival to the ED because of concern for slurred speech code stroke was activated    07/27: to ED. Code stroke and neurology consult - recs CTA H/N and MRI brain non-acute. Did now bilateral vertebral artery stenosis - per vascular surgery there is no indication for procedural intervention on this, per Dr Laurence CTA does NOT demonstrate ICA stenoses >50% so no indication for carotid intervention per vascular surgery  07/28: cardiology consult re chest pain and question need further ischemic workup, resume Imdur  and up-titrate as able to 120 mg, no plan for cardiac cath at this time. PT still dizzy / unsteady, PT/OT recs for  SNF pt would like to go home, will reattempt in AM and decide for rehab or home health.  07/29: improved today no longer meeting criteria for SNF, still lightheaded and needs assistance. Daughter will be taking patient home to live w/ her, orders in this afternoon for DME/HH and anticipate discharge in AM.   Consultants:  Neurology  Vascular Surgery  Cardiology   Procedures/Surgeries: none      ASSESSMENT & PLAN:   TIA vs hypotension / BPPV Acute dysarthria, improving Near syncope, persistent orthostatic lightheadedness  Orthostatic hypotension MRI brain negative for acute findings Continue Eliquis , statin PT OT Neurology follow up outpatient  Continue midodrine   Patient has been instructed that she is not to drive - daughter has requested a letter stating such for her records and daughter has taken patient's car keys    Anginal-like chest pain Multivessel CAD status post CABG Chronic stable angina Chronic HFrEF not in acute exacerbation  Echo EF 60-65 and Grade 1 diast df Continue ranolazine  On Eliquis , will not give ASA Per cardiology, up-titrate Imdur  Continue propranolol  for tremor and beta blocker Empagliflozin   Appreciate cardiology recs, cardio has s/o reengage as needed, outpatient follow up  No plan for LHC at this time, especially in setting of TIA as this would be high risk. Patient's atypical chest pain does not appear cardiac in nature. Patient reports improvement to chest pain with antianginal medications. Continue with optimizing antianginal medications at this time. Can consider outpatient LHC if patient has worsening anginal symptoms   PAF  Sinus rhythm,  continue Eliquis  and beta blocker    IIDM Hold home diabetic medication SSI   Tremors Suspect parkinsonian syndrom Continue Sinemet , propranolol  (tremors significantly worse without propranolol , gnereally improved on this + Sinemet )  Follow neurology outpatient       overweight based on BMI:  Body mass index is 28.35 kg/m.[name] Significantly low or high BMI is associated with higher medical risk.  Underweight - under 18  overweight - 25 to 29 obese - 30 or more Class 1 obesity: BMI of 30.0 to 34 Class 2 obesity: BMI of 35.0 to 39 Class 3 obesity: BMI of 40.0 to 49 Super Morbid Obesity: BMI 50-59 Super-super Morbid Obesity: BMI 60+ Healthy nutrition and physical activity advised as adjunct to other disease management and risk reduction treatments    DVT prophylaxis: Eliquis  IV fluids: no continuous IV fluids  Nutrition: cardiac/carb diet  Central lines / other devices: none  Code Status: FULL CODE ACP documentation reviewed:  none on file in [facility]  TOC needs: HH/DME Medical barriers to dispo: echo results. Expected medical readiness for discharge tomorrow.              Subjective / Brief ROS:  Patient reports still dizzy with walking but better, needing assistance She is very upset when I tell her I don't want her to be driving, tearful, perseverates on this despite me being firm with her  Denies CP/SOB now Pain controlled.  Denies new weakness.  Tolerating diet .  Reports no concerns w/ urination/defecation.   Family Communication: spoke w/ daughter this afternoon, all questions answered, she plans to take her mom home tomorrow once home health /DME arranged and she has the house ready     Objective Findings:  Vitals:   10/25/23 0357 10/25/23 0358 10/25/23 0759 10/25/23 1535  BP: 121/77 (!) 144/85 135/70 113/61  Pulse: 80 93 74 74  Resp: 18 20 16 18   Temp: 97.9 F (36.6 C) 97.9 F (36.6 C) 98 F (36.7 C) 97.8 F (36.6 C)  TempSrc:      SpO2: 100% 99% 98% 97%  Weight:      Height:        Intake/Output Summary (Last 24 hours) at 10/25/2023 1656 Last data filed at 10/25/2023 1300 Gross per 24 hour  Intake 840 ml  Output --  Net 840 ml   Filed Weights   10/23/23 1157  Weight: 70.3 kg    Examination:  Physical Exam Cardiovascular:      Rate and Rhythm: Normal rate and regular rhythm.  Pulmonary:     Effort: Pulmonary effort is normal.     Breath sounds: Normal breath sounds.  Abdominal:     General: Bowel sounds are normal.  Neurological:     Mental Status: She is alert and oriented to person, place, and time. Mental status is at baseline.  Psychiatric:        Mood and Affect: Mood normal.        Behavior: Behavior normal.          Scheduled Medications:   apixaban   5 mg Oral BID   atorvastatin   80 mg Oral Daily   carbidopa -levodopa   1 tablet Oral TID   empagliflozin   25 mg Oral q morning   ezetimibe   10 mg Oral Daily   insulin  aspart  0-15 Units Subcutaneous TID WC   isosorbide  mononitrate  120 mg Oral Daily   midodrine   2.5 mg Oral BID WC   pantoprazole   20 mg  Oral BID   propranolol  ER  60 mg Oral Daily   ranolazine   1,000 mg Oral BID   sertraline   100 mg Oral QHS   sucralfate   1 g Oral QID    Continuous Infusions:    PRN Medications:  butalbital -acetaminophen -caffeine , hydrALAZINE , morphine  injection, ondansetron  (ZOFRAN ) IV  Antimicrobials from admission:  Anti-infectives (From admission, onward)    None           Data Reviewed:  I have personally reviewed the following...  CBC: Recent Labs  Lab 10/23/23 1200 10/25/23 0421  WBC 6.4 4.7  NEUTROABS 3.5  --   HGB 11.6* 11.9*  HCT 35.3* 37.4  MCV 100.9* 102.5*  PLT 182 187   Basic Metabolic Panel: Recent Labs  Lab 10/23/23 1200 10/25/23 0421  NA 140 142  K 3.9 3.8  CL 105 106  CO2 25 28  GLUCOSE 107* 142*  BUN 11 10  CREATININE 0.89 0.88  CALCIUM  9.0 9.1   GFR: Estimated Creatinine Clearance: 53.1 mL/min (by C-G formula based on SCr of 0.88 mg/dL). Liver Function Tests: Recent Labs  Lab 10/23/23 1200  AST 17  ALT <5  ALKPHOS 100  BILITOT 0.8  PROT 6.7  ALBUMIN  3.4*   No results for input(s): LIPASE, AMYLASE in the last 168 hours. No results for input(s): AMMONIA in the last 168  hours. Coagulation Profile: Recent Labs  Lab 10/23/23 1200  INR 1.5*   Cardiac Enzymes: No results for input(s): CKTOTAL, CKMB, CKMBINDEX, TROPONINI in the last 168 hours. BNP (last 3 results) No results for input(s): PROBNP in the last 8760 hours. HbA1C: Recent Labs    10/23/23 1200  HGBA1C 6.5*   CBG: Recent Labs  Lab 10/24/23 1951 10/25/23 0803 10/25/23 1213 10/25/23 1538 10/25/23 1606  GLUCAP 148* 116* 149* 127* 123*   Lipid Profile: Recent Labs    10/24/23 0154  CHOL 132  HDL 54  LDLCALC 63  TRIG 74  CHOLHDL 2.4   Thyroid  Function Tests: No results for input(s): TSH, T4TOTAL, FREET4, T3FREE, THYROIDAB in the last 72 hours. Anemia Panel: No results for input(s): VITAMINB12, FOLATE, FERRITIN, TIBC, IRON, RETICCTPCT in the last 72 hours. Most Recent Urinalysis On File:     Component Value Date/Time   COLORURINE YELLOW (A) 07/28/2023 1050   APPEARANCEUR CLEAR (A) 07/28/2023 1050   APPEARANCEUR Clear 12/24/2014 0905   LABSPEC 1.030 07/28/2023 1050   LABSPEC 1.030 09/14/2013 2123   PHURINE 7.0 07/28/2023 1050   GLUCOSEU >=500 (A) 07/28/2023 1050   GLUCOSEU Negative 09/14/2013 2123   HGBUR NEGATIVE 07/28/2023 1050   BILIRUBINUR NEGATIVE 07/28/2023 1050   BILIRUBINUR Negative 02/06/2020 1014   BILIRUBINUR Negative 12/24/2014 0905   BILIRUBINUR Negative 09/14/2013 2123   KETONESUR NEGATIVE 07/28/2023 1050   PROTEINUR NEGATIVE 07/28/2023 1050   UROBILINOGEN 0.2 02/06/2020 1014   NITRITE NEGATIVE 07/28/2023 1050   LEUKOCYTESUR TRACE (A) 07/28/2023 1050   LEUKOCYTESUR 3+ 09/14/2013 2123   Sepsis Labs: @LABRCNTIP (procalcitonin:4,lacticidven:4) Microbiology: No results found for this or any previous visit (from the past 240 hours).    Radiology Studies last 3 days: ECHOCARDIOGRAM COMPLETE Result Date: 10/24/2023    ECHOCARDIOGRAM REPORT   Patient Name:   JILDA KRESS Goble Date of Exam: 10/24/2023 Medical Rec #:  [id number]        Height:       62.0 in Accession #:    [id number]      Weight:       155.0 lb Date of Birth:  04/14/1950  BSA:          1.715 m Patient Age:    73 years        BP:           126/66 mmHg Patient Gender: F               HR:           67 bpm. Exam Location:  [facility] Procedure: 2D Echo, Cardiac Doppler, Color Doppler and Saline Contrast Bubble            Study (Both Spectral and Color Flow Doppler were utilized during            procedure). Indications:     Stroke I63.9  History:         Patient has prior history of Echocardiogram examinations, most                  recent 07/01/2022. CHF, Arrythmias:Atrial Fibrillation; Risk                  Factors:Hypertension.  Sonographer:     Christopher Furnace Referring Phys:  [id number] CORT ONEIDA MANA Diagnosing Phys: Keller Alluri IMPRESSIONS  1. Left ventricular ejection fraction, by estimation, is 60 to 65%. The left ventricle has normal function. The left ventricle has no regional wall motion abnormalities. There is moderate left ventricular hypertrophy. Left ventricular diastolic parameters are consistent with Grade I diastolic dysfunction (impaired relaxation).  2. Right ventricular systolic function is normal. The right ventricular size is normal.  3. The mitral valve is normal in structure. Trivial mitral valve regurgitation. Moderate mitral annular calcification.  4. Aortic valve regurgitation is not visualized. Aortic valve sclerosis/calcification is present, without any evidence of aortic stenosis.  5. Agitated saline contrast bubble study was negative, with no evidence of any interatrial shunt. FINDINGS  Left Ventricle: Left ventricular ejection fraction, by estimation, is 60 to 65%. The left ventricle has normal function. The left ventricle has no regional wall motion abnormalities. The left ventricular internal cavity size was normal in size. There is  moderate left ventricular hypertrophy. Left ventricular diastolic parameters are consistent with Grade I diastolic  dysfunction (impaired relaxation). Right Ventricle: The right ventricular size is normal. No increase in right ventricular wall thickness. Right ventricular systolic function is normal. Left Atrium: Left atrial size was normal in size. Right Atrium: Right atrial size was normal in size. Pericardium: There is no evidence of pericardial effusion. Mitral Valve: The mitral valve is normal in structure. Moderate mitral annular calcification. Trivial mitral valve regurgitation. MV peak gradient, 6.5 mmHg. The mean mitral valve gradient is 3.0 mmHg. Tricuspid Valve: The tricuspid valve is normal in structure. Tricuspid valve regurgitation is mild. Aortic Valve: Aortic valve regurgitation is not visualized. Aortic valve sclerosis/calcification is present, without any evidence of aortic stenosis. Aortic valve mean gradient measures 4.7 mmHg. Aortic valve peak gradient measures 9.3 mmHg. Aortic valve  area, by VTI measures 1.58 cm. Pulmonic Valve: The pulmonic valve was not well visualized. Pulmonic valve regurgitation is not visualized. Aorta: The aortic root is normal in size and structure. Venous: The inferior vena cava was not well visualized. IAS/Shunts: The atrial septum is grossly normal. Agitated saline contrast was given intravenously to evaluate for intracardiac shunting. Agitated saline contrast bubble study was negative, with no evidence of any interatrial shunt.  LEFT VENTRICLE PLAX 2D LVIDd:         4.00 cm   Diastology LVIDs:  2.70 cm   LV e' medial:    6.20 cm/s LV PW:         1.60 cm   LV E/e' medial:  17.1 LV IVS:        1.40 cm   LV e' lateral:   5.87 cm/s LVOT diam:     2.00 cm   LV E/e' lateral: 18.1 LV SV:         47 LV SV Index:   27 LVOT Area:     3.14 cm  RIGHT VENTRICLE RV Basal diam:  3.00 cm RV Mid diam:    2.00 cm RV S prime:     7.94 cm/s TAPSE (M-mode): 1.7 cm LEFT ATRIUM             Index        RIGHT ATRIUM           Index LA diam:        3.30 cm 1.92 cm/m   RA Area:     13.30 cm LA  Vol (A2C):   56.5 ml 32.94 ml/m  RA Volume:   28.30 ml  16.50 ml/m LA Vol (A4C):   47.2 ml 27.52 ml/m LA Biplane Vol: 53.8 ml 31.36 ml/m  AORTIC VALVE AV Area (Vmax):    1.56 cm AV Area (Vmean):   1.60 cm AV Area (VTI):     1.58 cm AV Vmax:           152.33 cm/s AV Vmean:          97.167 cm/s AV VTI:            0.299 m AV Peak Grad:      9.3 mmHg AV Mean Grad:      4.7 mmHg LVOT Vmax:         75.80 cm/s LVOT Vmean:        49.500 cm/s LVOT VTI:          0.150 m LVOT/AV VTI ratio: 0.50  AORTA Ao Root diam: 2.50 cm MITRAL VALVE                TRICUSPID VALVE MV Area (PHT): 3.43 cm     TR Peak grad:   22.3 mmHg MV Area VTI:   1.39 cm     TR Vmax:        236.00 cm/s MV Peak grad:  6.5 mmHg MV Mean grad:  3.0 mmHg     SHUNTS MV Vmax:       1.27 m/s     Systemic VTI:  0.15 m MV Vmean:      88.1 cm/s    Systemic Diam: 2.00 cm MV Decel Time: 221 msec MV E velocity: 106.00 cm/s MV A velocity: 109.00 cm/s MV E/A ratio:  0.97 Keller Paterson Electronically signed by Keller Paterson Signature Date/Time: 10/24/2023/6:47:04 PM    Final    MR BRAIN WO CONTRAST Result Date: 10/23/2023 CLINICAL DATA:  Stroke, follow up.  Headache. EXAM: MRI HEAD WITHOUT CONTRAST TECHNIQUE: Multiplanar, multiecho pulse sequences of the brain and surrounding structures were obtained without intravenous contrast. COMPARISON:  Head CT and CTA 10/23/2023 and MRI 07/28/2023 FINDINGS: Brain: There is no evidence of an acute infarct, intracranial hemorrhage, mass, midline shift, or extra-axial fluid collection. Cerebral volume is within normal limits for age. The ventricles are normal in size. Patchy T2 hyperintensities in the cerebral white matter are similar to the prior MRI and are nonspecific but compatible with moderate chronic small vessel  ischemic disease. Chronic lacunar infarcts are again noted in the basal ganglia and deep white matter bilaterally, and there is also an unchanged small chronic cortical infarct in the medial right parietal  lobe. Vascular: Major intracranial vascular flow voids are preserved. Skull and upper cervical spine: Unremarkable bone marrow signal. Sinuses/Orbits: Unremarkable orbits. Paranasal sinuses and mastoid air cells are clear. Other: None. IMPRESSION: 1. No acute intracranial abnormality. 2. Moderate chronic small vessel ischemic disease. Electronically Signed   By: Dasie Hamburg M.D.   On: 10/23/2023 15:18   CT ANGIO HEAD NECK W WO CM Result Date: 10/23/2023 CLINICAL DATA:  Neuro deficit, acute, stroke suspected.  Headache. EXAM: CT ANGIOGRAPHY HEAD AND NECK WITH AND WITHOUT CONTRAST TECHNIQUE: Multidetector CT imaging of the head and neck was performed using the standard protocol during bolus administration of intravenous contrast. Multiplanar CT image reconstructions and MIPs were obtained to evaluate the vascular anatomy. Carotid stenosis measurements (when applicable) are obtained utilizing NASCET criteria, using the distal internal carotid diameter as the denominator. RADIATION DOSE REDUCTION: This exam was performed according to the departmental dose-optimization program which includes automated exposure control, adjustment of the mA and/or kV according to patient size and/or use of iterative reconstruction technique. CONTRAST:  75mL OMNIPAQUE  IOHEXOL  350 MG/ML SOLN COMPARISON:  CTA head and neck 01/01/2022 FINDINGS: CTA NECK FINDINGS Aortic arch: Standard branching with mild atherosclerosis. No significant stenosis of the arch vessel origins. Right carotid system: Patent with calcified plaque about the carotid bifurcation resulting in 55-60% stenosis of the proximal ICA, unchanged. Left carotid system: Patent with scattered calcified and soft plaque throughout the common carotid artery and about the carotid bifurcation without evidence of a significant stenosis. Vertebral arteries: Patent with the left being slightly dominant. Calcified plaque at both vertebral artery origins resulting in new moderate stenosis  bilaterally. Skeleton: Advanced cervical spondylosis. Absent maxillary dentition. Carious right mandibular molar. Other neck: No evidence of cervical lymphadenopathy or mass. Upper chest: No mass or consolidation in the included lung apices. Review of the MIP images confirms the above findings CTA HEAD FINDINGS Anterior circulation: The internal carotid arteries are patent from skull base to carotid termini with calcified plaque resulting in mild right and moderate left cavernous and mild bilateral supraclinoid stenoses, similar to the prior CTA. ACAs and MCAs are patent without evidence of a proximal branch occlusion or significant A1 or M1 stenosis. Branch vessel irregularity is again noted including an unchanged moderate distal left M2 branch vessel stenosis. No aneurysm is identified. Posterior circulation: The intracranial vertebral arteries are patent to the basilar with atherosclerosis in the left V4 segment not resulting in a flow limiting stenosis. The basilar artery is patent with a severe stenosis proximally which is stable to mildly progressive. There are large right and diminutive or absent left posterior communicating arteries with hypoplasia of the right P1 segment. Both PCAs are patent with branch vessel irregularity but no evidence of a significant proximal stenosis. No aneurysm is identified. Venous sinuses: As permitted by contrast timing, patent. Anatomic variants: Fetal right PCA. Review of the MIP images confirms the above findings IMPRESSION: 1. No large vessel occlusion. 2. Unchanged 55-60% proximal right ICA stenosis. 3. New moderate bilateral vertebral artery origin stenoses. 4. Severe proximal basilar artery stenosis, stable to mildly progressed. 5. Unchanged moderate left and mild right intracranial ICA stenoses. 6.  Aortic Atherosclerosis (ICD10-I70.0). Electronically Signed   By: Dasie Hamburg M.D.   On: 10/23/2023 14:40   CT HEAD CODE STROKE WO CONTRAST Result  Date: 10/23/2023 EXAM:  CT HEAD WITHOUT 10/23/2023 12:21:24 PM TECHNIQUE: CT of the head was performed without the administration of intravenous contrast. Automated exposure control, iterative reconstruction, and/or weight based adjustment of the mA/kV was utilized to reduce the radiation dose to as low as reasonably achievable. COMPARISON: MR head without contrast 07/28/2023. CT head without contrast 01/10/2023. CLINICAL HISTORY: Neuro deficit, acute, stroke suspected. Code stroke DR Juanell [id number] FINDINGS: BRAIN AND VENTRICLES: No acute intracranial hemorrhage. No mass effect or midline shift. No extra-axial fluid collection. Gray-white differentiation is maintained. No hydrocephalus. Remote lacunar infarcts of the basal ganglia and corona radiata are stable. Periventricular white matter hypoattenuation is moderately advanced for age, stable. ORBITS: No acute abnormality. SINUSES AND MASTOIDS: No acute abnormality. SOFT TISSUES AND SKULL: No acute skull fracture. No acute soft tissue abnormality. VASCULATURE: Atherosclerotic calcifications are present in the cavernous carotid arteries bilaterally and at the dural margin of both vertebral arteries. No hyperdense vessel is present. [place] stroke program early CT (ASPECT) score ----- Ganglionic (caudate, internal capsule, lentiform nucleus, insula, M1-M3): 7 Supraganglionic (M4-M6): 3 Total: 10 IMPRESSION: 1. No acute intracranial abnormality. 2. Stable remote lacunar infarcts of the basal ganglia and corona radiata. 3. Moderately advanced periventricular white matter hypoattenuation, stable. 4. Atherosclerotic calcifications in the cavernous carotid arteries bilaterally and at the dural margin of both vertebral arteries. No hyperdense vessel is present. Findings were communicated to Dr. Jerrie via the [facility] system at 12:30 pm. Electronically signed by: Lonni Necessary MD 10/23/2023 12:32 PM EDT RP Workstation: [id number]         Laneta Blunt, DO [facility] 10/25/2023, 4:56 PM    Dictation software may have been used to generate the above note. Typos may occur and escape review in typed/dictated notes. Please contact Dr Blunt directly for clarity if needed.  Staff may message me via secure chat in Epic  but this may not receive an immediate response,  please page me for urgent matters!  If 7PM-7AM, please contact night coverage [email or web address]

## 2023-10-25 NOTE — Progress Notes (Signed)
 [facility] CLINIC CARDIOLOGY PROGRESS NOTE       Patient ID: Candace [name] MRN: [id number] DOB/AGE: [birth year] 73 y.o.  Admit date: 10/23/2023 Referring Physician Dr. Marsa Primary Physician Bertrum Charlie CROME, MD Primary Cardiologist Dr. Florencio Reason for Consultation angina  HPI: Candace [name] is a 73 y.o. female  with a past medical history of severe 3 vessel coronary artery disease s/p CABG x 4 (atretic LIMA-LAD, occluded SVG to OM, patent SVG to RCA, with -70-80% mid LAD, 60-70% diffuse LCx disease (small vessel) and 90% distal RCA by [facility] 07/11/2020), orthostatic hypotension, hypertension, hyperlipidemia, chronic HFpEF (EF 65-70%, G1 DD 06/2022), paroxysmal atrial fibrillation (Eliquis ), hx TIA, DM2, OSA on CPAP, essential tremor  who presented to the ED on 10/23/2023 for headache, blurry vision, slurred speech and atypical chest pain. Code Stroke was called. CT and MRI negative for acute stroke.  Cardiology was consulted for further evaluation of atypical chest pain.   Interval History: -Patient seen and examined this AM and laying comfortably in hospital bed eating breakfast. . Patient states she feels a lot better and denies chest pain, palpitations, SOB or lightheadedness.  -Patient states she walked to bathroom with walker with no concerns this AM -Patients BP and HR stable this AM.  -Patient remains on room air with stable SpO2.  -Recommend patient ambulate today.   Pertinent Cardiac History (Most recent) 09/15/2023 PET CT   LV perfusion is abnormal. There is abnormal wall motion in the defect area. There is medium size, moderate intensity, reversible defect noted in apical lateral/anterior wall, mid anterolateral/inferolateral wall suggestive of ischemia, SDS 7. Abnormal TID of 1.29 along with drop in stress LVEF noted which can suggest multivessel obstructive coronary artery disease.   Rest EF: 51%. Stress left ventricular function is abnormal. Stress global function is  mildly reduced. Stress EF: 43%. End diastolic cavity size is normal. End systolic cavity size is normal.   Myocardial blood flow was computed to be 0.75ml/g/min at rest and 1.40ml/g/min at stress. Global myocardial blood flow reserve was 1.75 and was mildly abnormal.   Coronary calcium  was present on the attenuation correction CT images. Severe coronary calcifications were present. Coronary calcifications were present in the left anterior descending artery, left circumflex artery and right coronary artery distribution(s).   The study is high risk with area of ischemia along with abnormal TID and reduced stress LVEF.  LHC 07/21/2020 1. Severe 3v CAD including 70-80% mid LAD, 60-70% diffuse LCx disease  (small vessel) and 90% distal RCA.   2. Patent SVG to RCA, Occluded SVG to OM (likely culprit), Atretic LIMA to  LAD  3. Normal left ventricular filling pressures (LVEDP = 15  mmHg)   Review of systems complete and found to be negative unless listed above    Past Medical History:  Diagnosis Date   Allergy    Anemia    Anxiety    Arrhythmia    Arthritis    Atrial fibrillation (HCC)    CHF (congestive heart failure) (HCC)    COPD (chronic obstructive pulmonary disease) (HCC)    Coronary artery disease    Depression    Diabetes mellitus without complication (HCC)    Dyspnea    doe   Dysrhythmia    GERD (gastroesophageal reflux disease)    Headache    History of hiatal hernia    Hyperlipidemia    Hypertension    Myocardial infarction [facility])    2016, 04/2017   Myocardial infarction with cardiac rehabilitation [facility])  MI 2016/ CABG 8/17    FINISHED CARDIAC REHAB 3 WEEKS AGO   Panic attack    Pneumonia    Reflux    Stroke (HCC) 2015   showed up on MRI; no weakness noted   TIA (transient ischemic attack)    Voice tremor     Past Surgical History:  Procedure Laterality Date   ABDOMINAL HYSTERECTOMY     APPENDECTOMY  1975   ARTERY BIOPSY Right 04/26/2016   Procedure: BIOPSY  TEMPORAL ARTERY;  Surgeon: Deward Argue, MD;  Location: [facility] ORS;  Service: ENT;  Laterality: Right;   CARDIAC CATHETERIZATION N/A 11/06/2015   Procedure: Left Heart Cath and Coronary Angiography;  Surgeon: Wolm JINNY Rhyme, MD;  Location: [facility] INVASIVE CV LAB;  Service: Cardiovascular;  Laterality: N/A;   CESAREAN SECTION     COLONOSCOPY  2015   COLONOSCOPY WITH PROPOFOL  N/A 11/03/2018   Procedure: COLONOSCOPY WITH PROPOFOL ;  Surgeon: Janalyn Keene NOVAK, MD;  Location: [facility] ENDOSCOPY;  Service: Endoscopy;  Laterality: N/A;   CORONARY ANGIOPLASTY  04/2017   [facility]   CORONARY ARTERY BYPASS GRAFT N/A 11/10/2015   Procedure: CORONARY ARTERY BYPASS GRAFTING (CABG), ON PUMP, TIMES FOUR, USING LEFT INTERNAL MAMMARY ARTERY, BILATERAL GREATER SAPHENOUS VEINS HARVESTED ENDOSCOPICALLY;  Surgeon: Dallas NOVAK Jude, MD;  Location: [facility] OR;  Service: Open Heart Surgery;  Laterality: N/A;  LIMA-LAD; SEQ SVG-OM1-OM2; SVG-PL   CORONARY STENT INTERVENTION N/A 08/05/2016   Procedure: Coronary Stent Intervention;  Surgeon: Ammon Blunt, MD;  Location: [facility] INVASIVE CV LAB;  Service: Cardiovascular;  Laterality: N/A;   ESOPHAGOGASTRODUODENOSCOPY (EGD) WITH PROPOFOL  N/A 11/03/2018   Procedure: ESOPHAGOGASTRODUODENOSCOPY (EGD) WITH PROPOFOL ;  Surgeon: Janalyn Keene NOVAK, MD;  Location: [facility] ENDOSCOPY;  Service: Endoscopy;  Laterality: N/A;   HYSTERECTOMY ABDOMINAL WITH SALPINGO-OOPHORECTOMY Bilateral 08/15/2017   Procedure: HYSTERECTOMY ABDOMINAL WITH BILATERAL SALPINGO-OOPHORECTOMY;  Surgeon: Connell Davies, MD;  Location: [facility] ORS;  Service: Gynecology;  Laterality: Bilateral;   LEFT HEART CATH AND CORONARY ANGIOGRAPHY N/A 08/05/2016   Procedure: Left Heart Cath and Coronary Angiography;  Surgeon: Ammon Blunt, MD;  Location: [facility] INVASIVE CV LAB;  Service: Cardiovascular;  Laterality: N/A;   LEFT HEART CATH AND CORS/GRAFTS ANGIOGRAPHY N/A 09/19/2018   Procedure: LEFT HEART CATH AND  CORS/GRAFTS ANGIOGRAPHY;  Surgeon: Rhyme Wolm JINNY, MD;  Location: [facility] INVASIVE CV LAB;  Service: Cardiovascular;  Laterality: N/A;   LEFT HEART CATH AND CORS/GRAFTS ANGIOGRAPHY N/A 08/30/2019   Procedure: LEFT HEART CATH AND CORS/GRAFTS ANGIOGRAPHY;  Surgeon: Bosie Vinie LABOR, MD;  Location: [facility] INVASIVE CV LAB;  Service: Cardiovascular;  Laterality: N/A;   OOPHORECTOMY     TEE WITHOUT CARDIOVERSION N/A 11/10/2015   Procedure: TRANSESOPHAGEAL ECHOCARDIOGRAM (TEE);  Surgeon: Dallas NOVAK Jude, MD;  Location: [facility] OR;  Service: Open Heart Surgery;  Laterality: N/A;   TUBAL LIGATION      Medications Prior to Admission  Medication Sig Dispense Refill Last Dose/Taking   acetaminophen  (TYLENOL ) 325 MG tablet Take 650 mg by mouth every 6 (six) hours as needed for moderate pain or headache.    Unknown   albuterol  (VENTOLIN  HFA) 108 (90 Base) MCG/ACT inhaler Inhale 2 puffs into the lungs every 6 (six) hours as needed for wheezing or shortness of breath. 8 g 2 Unknown   atorvastatin  (LIPITOR ) 80 MG tablet Take 1 tablet (80 mg total) by mouth daily. 30 tablet 0 10/23/2023 Morning   carbidopa -levodopa  (SINEMET  IR) 10-100 MG tablet Take 1 tablet by mouth 3 (three) times daily.   10/23/2023 Morning  Dulaglutide  (TRULICITY ) 1.5 MG/0.5ML SOPN Inject 1.5 mg into the skin once a week. Typically takes Weds, ok to administer 01/14/23 has not had it this week, can resume weekly on Fridays after today's dose   10/20/2023   ELIQUIS  5 MG TABS tablet Take 5 mg by mouth 2 (two) times daily.   10/23/2023 Morning   ezetimibe  (ZETIA ) 10 MG tablet Take 1 tablet (10 mg total) by mouth daily. 90 tablet 1 10/23/2023 Morning   insulin  glargine (LANTUS  SOLOSTAR) 100 UNIT/ML Solostar Pen Inject 12 Units into the skin at bedtime. 18 mL 3 10/22/2023   isosorbide  mononitrate (IMDUR ) 120 MG 24 hr tablet Take 1 tablet (120 mg total) by mouth daily. 30 tablet 0 10/23/2023 Morning   JARDIANCE  10 MG TABS tablet Take 25 mg by mouth every morning.    10/23/2023 Morning   midodrine  (PROAMATINE ) 2.5 MG tablet Take 2.5 mg by mouth 2 (two) times daily with a meal.   10/23/2023 Morning   ondansetron  (ZOFRAN -ODT) 8 MG disintegrating tablet Take 8 mg by mouth every 8 (eight) hours as needed.   Unknown   pantoprazole  (PROTONIX ) 20 MG tablet Take 1 tablet (20 mg total) by mouth 2 (two) times daily. 60 tablet 11 10/23/2023 Morning   propranolol  ER (INDERAL  LA) 60 MG 24 hr capsule Take 60 mg by mouth daily.   10/23/2023 Morning   ranolazine  (RANEXA ) 1000 MG SR tablet Take 1 tablet (1,000 mg total) by mouth 2 (two) times daily. 60 tablet 0 10/23/2023 Morning   sertraline  (ZOLOFT ) 100 MG tablet Take 1 tablet (100 mg total) by mouth at bedtime.   10/22/2023 Bedtime   sucralfate  (CARAFATE ) 1 g tablet Take 1 g by mouth 4 (four) times daily.   10/23/2023 Morning   Blood Glucose Monitoring Suppl (ONETOUCH VERIO) w/Device KIT Use daily to check blood sugar 1 kit 0    COMFORT EZ PEN NEEDLES 32G X 4 MM MISC Use to inject insulin  daily 100 each 1    glucose blood test strip Use to check blood sugars daily as instructed 100 each 12    Lancets (ONETOUCH DELICA PLUS LANCET33G) MISC USE UP TO 4 TIMES DAILY AS DIRECTED 100 each 0    nitroGLYCERIN  (NITROSTAT ) 0.4 MG SL tablet Place 1 tablet (0.4 mg total) under the tongue every 5 (five) minutes as needed for chest pain. (Patient not taking: Reported on 07/29/2023) 30 tablet 0    Social History   Socioeconomic History   Marital status: Divorced    Spouse name: Not on file   Number of children: 3   Years of education: Not on file   Highest education level: Some college, no degree  Occupational History   Occupation: retired  Tobacco Use   Smoking status: Former    Current packs/day: 0.00    Types: Cigarettes    Quit date: 10/07/2001    Years since quitting: 22.0   Smokeless tobacco: Never  Vaping Use   Vaping status: Never Used  Substance and Sexual Activity   Alcohol use: No    Alcohol/week: 0.0 standard drinks of  alcohol   Drug use: No   Sexual activity: Not Currently  Other Topics Concern   Not on file  Social History Narrative   Lives at home alone   Social Drivers of Health   Financial Resource Strain: Low Risk  (01/26/2023)   Received from [facility]   Overall Financial Resource Strain (CARDIA)    Difficulty of Paying Living Expenses: Not  hard at all  Food Insecurity: No Food Insecurity (10/23/2023)   Hunger Vital Sign    Worried About Running Out of Food in the Last Year: Never true    Ran Out of Food in the Last Year: Never true  Transportation Needs: No Transportation Needs (10/23/2023)   PRAPARE - Administrator, Civil Service (Medical): No    Lack of Transportation (Non-Medical): No  Physical Activity: Inactive (01/26/2023)   Received from [facility]   Exercise Vital Sign    On average, how many days per week do you engage in moderate to strenuous exercise (like a brisk walk)?: 0 days    On average, how many minutes do you engage in exercise at this level?: 0 min  Stress: No Stress Concern Present (03/26/2022)   [employer] of Occupational Health - Occupational Stress Questionnaire    Feeling of Stress : Not at all  Social Connections: Moderately Isolated (10/23/2023)   Social Connection and Isolation Panel    Frequency of Communication with Friends and Family: More than three times a week    Frequency of Social Gatherings with Friends and Family: Twice a week    Attends Religious Services: More than 4 times per year    Active Member of [employer] or Organizations: No    Attends Banker Meetings: Never    Marital Status: Divorced  Catering manager Violence: Not At Risk (10/23/2023)   Humiliation, Afraid, Rape, and Kick questionnaire    Fear of Current or Ex-Partner: No    Emotionally Abused: No    Physically Abused: No    Sexually Abused: No    Family History  Problem Relation Age of Onset   Cancer Father     Hypertension Father    Heart disease Father    Asthma Father    Cancer Mother    Hypertension Mother    Pancreatic cancer Mother 22   Cancer Sister    Breast cancer Sister 26   Breast cancer Sister 38   Lung cancer Brother    Pancreatic cancer Sister 35   Cancer Sister      Vitals:   10/25/23 0355 10/25/23 0357 10/25/23 0358 10/25/23 0759  BP: 134/68 121/77 (!) 144/85 135/70  Pulse: 75 80 93 74  Resp: 18 18 20 16   Temp: 97.9 F (36.6 C) 97.9 F (36.6 C) 97.9 F (36.6 C) 98 F (36.7 C)  TempSrc:      SpO2: 99% 100% 99% 98%  Weight:      Height:        PHYSICAL EXAM General: Chronically ill-appearing elderly female, well nourished, in no acute distress. HEENT: Normocephalic and atraumatic. Neck: No JVD.   Lungs: Normal respiratory effort on room air. Clear bilaterally to auscultation. No wheezes, crackles, rhonchi.  Heart: HRRR. Normal S1 and S2 without gallops or murmurs.  Abdomen: Non-distended appearing.  Msk: Normal strength and tone for age. Extremities: Warm and well perfused. No clubbing, cyanosis, edema.  Neuro: Alert and oriented X 3. Psych: Answers questions appropriately.   Labs: Basic Metabolic Panel: Recent Labs    10/23/23 1200 10/25/23 0421  NA 140 142  K 3.9 3.8  CL 105 106  CO2 25 28  GLUCOSE 107* 142*  BUN 11 10  CREATININE 0.89 0.88  CALCIUM  9.0 9.1   Liver Function Tests: Recent Labs    10/23/23 1200  AST 17  ALT <5  ALKPHOS 100  BILITOT 0.8  PROT 6.7  ALBUMIN   3.4*   No results for input(s): LIPASE, AMYLASE in the last 72 hours. CBC: Recent Labs    10/23/23 1200 10/25/23 0421  WBC 6.4 4.7  NEUTROABS 3.5  --   HGB 11.6* 11.9*  HCT 35.3* 37.4  MCV 100.9* 102.5*  PLT 182 187   Cardiac Enzymes: Recent Labs    10/23/23 1200 10/23/23 1413  TROPONINIHS 6 5   BNP: No results for input(s): BNP in the last 72 hours. D-Dimer: No results for input(s): DDIMER in the last 72 hours. Hemoglobin A1C: Recent Labs     10/23/23 1200  HGBA1C 6.5*   Fasting Lipid Panel: Recent Labs    10/24/23 0154  CHOL 132  HDL 54  LDLCALC 63  TRIG 74  CHOLHDL 2.4   Thyroid  Function Tests: No results for input(s): TSH, T4TOTAL, T3FREE, THYROIDAB in the last 72 hours.  Invalid input(s): FREET3 Anemia Panel: No results for input(s): VITAMINB12, FOLATE, FERRITIN, TIBC, IRON, RETICCTPCT in the last 72 hours.   Radiology: ECHOCARDIOGRAM COMPLETE Result Date: 10/24/2023    ECHOCARDIOGRAM REPORT   Patient Name:   Candace [name] Widen Date of Exam: 10/24/2023 Medical Rec #:  [id number]       Height:       62.0 in Accession #:    [id number]      Weight:       155.0 lb Date of Birth:  10-16-1950       BSA:          1.715 m Patient Age:    73 years        BP:           126/66 mmHg Patient Gender: F               HR:           67 bpm. Exam Location:  [facility] Procedure: 2D Echo, Cardiac Doppler, Color Doppler and Saline Contrast Bubble            Study (Both Spectral and Color Flow Doppler were utilized during            procedure). Indications:     Stroke I63.9  History:         Patient has prior history of Echocardiogram examinations, most                  recent 07/01/2022. CHF, Arrythmias:Atrial Fibrillation; Risk                  Factors:Hypertension.  Sonographer:     Christopher Furnace Referring Phys:  [id number] CORT ONEIDA MANA Diagnosing Phys: Keller Alluri IMPRESSIONS  1. Left ventricular ejection fraction, by estimation, is 60 to 65%. The left ventricle has normal function. The left ventricle has no regional wall motion abnormalities. There is moderate left ventricular hypertrophy. Left ventricular diastolic parameters are consistent with Grade I diastolic dysfunction (impaired relaxation).  2. Right ventricular systolic function is normal. The right ventricular size is normal.  3. The mitral valve is normal in structure. Trivial mitral valve regurgitation. Moderate mitral annular calcification.  4. Aortic valve regurgitation is  not visualized. Aortic valve sclerosis/calcification is present, without any evidence of aortic stenosis.  5. Agitated saline contrast bubble study was negative, with no evidence of any interatrial shunt. FINDINGS  Left Ventricle: Left ventricular ejection fraction, by estimation, is 60 to 65%. The left ventricle has normal function. The left ventricle has no regional wall motion abnormalities. The left ventricular internal cavity size was normal  in size. There is  moderate left ventricular hypertrophy. Left ventricular diastolic parameters are consistent with Grade I diastolic dysfunction (impaired relaxation). Right Ventricle: The right ventricular size is normal. No increase in right ventricular wall thickness. Right ventricular systolic function is normal. Left Atrium: Left atrial size was normal in size. Right Atrium: Right atrial size was normal in size. Pericardium: There is no evidence of pericardial effusion. Mitral Valve: The mitral valve is normal in structure. Moderate mitral annular calcification. Trivial mitral valve regurgitation. MV peak gradient, 6.5 mmHg. The mean mitral valve gradient is 3.0 mmHg. Tricuspid Valve: The tricuspid valve is normal in structure. Tricuspid valve regurgitation is mild. Aortic Valve: Aortic valve regurgitation is not visualized. Aortic valve sclerosis/calcification is present, without any evidence of aortic stenosis. Aortic valve mean gradient measures 4.7 mmHg. Aortic valve peak gradient measures 9.3 mmHg. Aortic valve  area, by VTI measures 1.58 cm. Pulmonic Valve: The pulmonic valve was not well visualized. Pulmonic valve regurgitation is not visualized. Aorta: The aortic root is normal in size and structure. Venous: The inferior vena cava was not well visualized. IAS/Shunts: The atrial septum is grossly normal. Agitated saline contrast was given intravenously to evaluate for intracardiac shunting. Agitated saline contrast bubble study was negative, with no evidence  of any interatrial shunt.  LEFT VENTRICLE PLAX 2D LVIDd:         4.00 cm   Diastology LVIDs:         2.70 cm   LV e' medial:    6.20 cm/s LV PW:         1.60 cm   LV E/e' medial:  17.1 LV IVS:        1.40 cm   LV e' lateral:   5.87 cm/s LVOT diam:     2.00 cm   LV E/e' lateral: 18.1 LV SV:         47 LV SV Index:   27 LVOT Area:     3.14 cm  RIGHT VENTRICLE RV Basal diam:  3.00 cm RV Mid diam:    2.00 cm RV S prime:     7.94 cm/s TAPSE (M-mode): 1.7 cm LEFT ATRIUM             Index        RIGHT ATRIUM           Index LA diam:        3.30 cm 1.92 cm/m   RA Area:     13.30 cm LA Vol (A2C):   56.5 ml 32.94 ml/m  RA Volume:   28.30 ml  16.50 ml/m LA Vol (A4C):   47.2 ml 27.52 ml/m LA Biplane Vol: 53.8 ml 31.36 ml/m  AORTIC VALVE AV Area (Vmax):    1.56 cm AV Area (Vmean):   1.60 cm AV Area (VTI):     1.58 cm AV Vmax:           152.33 cm/s AV Vmean:          97.167 cm/s AV VTI:            0.299 m AV Peak Grad:      9.3 mmHg AV Mean Grad:      4.7 mmHg LVOT Vmax:         75.80 cm/s LVOT Vmean:        49.500 cm/s LVOT VTI:          0.150 m LVOT/AV VTI ratio: 0.50  AORTA Ao Root diam: 2.50 cm MITRAL  VALVE                TRICUSPID VALVE MV Area (PHT): 3.43 cm     TR Peak grad:   22.3 mmHg MV Area VTI:   1.39 cm     TR Vmax:        236.00 cm/s MV Peak grad:  6.5 mmHg MV Mean grad:  3.0 mmHg     SHUNTS MV Vmax:       1.27 m/s     Systemic VTI:  0.15 m MV Vmean:      88.1 cm/s    Systemic Diam: 2.00 cm MV Decel Time: 221 msec MV E velocity: 106.00 cm/s MV A velocity: 109.00 cm/s MV E/A ratio:  0.97 Keller Paterson Electronically signed by Keller Paterson Signature Date/Time: 10/24/2023/6:47:04 PM    Final    MR BRAIN WO CONTRAST Result Date: 10/23/2023 CLINICAL DATA:  Stroke, follow up.  Headache. EXAM: MRI HEAD WITHOUT CONTRAST TECHNIQUE: Multiplanar, multiecho pulse sequences of the brain and surrounding structures were obtained without intravenous contrast. COMPARISON:  Head CT and CTA 10/23/2023 and MRI  07/28/2023 FINDINGS: Brain: There is no evidence of an acute infarct, intracranial hemorrhage, mass, midline shift, or extra-axial fluid collection. Cerebral volume is within normal limits for age. The ventricles are normal in size. Patchy T2 hyperintensities in the cerebral white matter are similar to the prior MRI and are nonspecific but compatible with moderate chronic small vessel ischemic disease. Chronic lacunar infarcts are again noted in the basal ganglia and deep white matter bilaterally, and there is also an unchanged small chronic cortical infarct in the medial right parietal lobe. Vascular: Major intracranial vascular flow voids are preserved. Skull and upper cervical spine: Unremarkable bone marrow signal. Sinuses/Orbits: Unremarkable orbits. Paranasal sinuses and mastoid air cells are clear. Other: None. IMPRESSION: 1. No acute intracranial abnormality. 2. Moderate chronic small vessel ischemic disease. Electronically Signed   By: Dasie Hamburg M.D.   On: 10/23/2023 15:18   CT ANGIO HEAD NECK W WO CM Result Date: 10/23/2023 CLINICAL DATA:  Neuro deficit, acute, stroke suspected.  Headache. EXAM: CT ANGIOGRAPHY HEAD AND NECK WITH AND WITHOUT CONTRAST TECHNIQUE: Multidetector CT imaging of the head and neck was performed using the standard protocol during bolus administration of intravenous contrast. Multiplanar CT image reconstructions and MIPs were obtained to evaluate the vascular anatomy. Carotid stenosis measurements (when applicable) are obtained utilizing NASCET criteria, using the distal internal carotid diameter as the denominator. RADIATION DOSE REDUCTION: This exam was performed according to the departmental dose-optimization program which includes automated exposure control, adjustment of the mA and/or kV according to patient size and/or use of iterative reconstruction technique. CONTRAST:  75mL OMNIPAQUE  IOHEXOL  350 MG/ML SOLN COMPARISON:  CTA head and neck 01/01/2022 FINDINGS: CTA NECK  FINDINGS Aortic arch: Standard branching with mild atherosclerosis. No significant stenosis of the arch vessel origins. Right carotid system: Patent with calcified plaque about the carotid bifurcation resulting in 55-60% stenosis of the proximal ICA, unchanged. Left carotid system: Patent with scattered calcified and soft plaque throughout the common carotid artery and about the carotid bifurcation without evidence of a significant stenosis. Vertebral arteries: Patent with the left being slightly dominant. Calcified plaque at both vertebral artery origins resulting in new moderate stenosis bilaterally. Skeleton: Advanced cervical spondylosis. Absent maxillary dentition. Carious right mandibular molar. Other neck: No evidence of cervical lymphadenopathy or mass. Upper chest: No mass or consolidation in the included lung apices. Review of the MIP images confirms the  above findings CTA HEAD FINDINGS Anterior circulation: The internal carotid arteries are patent from skull base to carotid termini with calcified plaque resulting in mild right and moderate left cavernous and mild bilateral supraclinoid stenoses, similar to the prior CTA. ACAs and MCAs are patent without evidence of a proximal branch occlusion or significant A1 or M1 stenosis. Branch vessel irregularity is again noted including an unchanged moderate distal left M2 branch vessel stenosis. No aneurysm is identified. Posterior circulation: The intracranial vertebral arteries are patent to the basilar with atherosclerosis in the left V4 segment not resulting in a flow limiting stenosis. The basilar artery is patent with a severe stenosis proximally which is stable to mildly progressive. There are large right and diminutive or absent left posterior communicating arteries with hypoplasia of the right P1 segment. Both PCAs are patent with branch vessel irregularity but no evidence of a significant proximal stenosis. No aneurysm is identified. Venous sinuses: As  permitted by contrast timing, patent. Anatomic variants: Fetal right PCA. Review of the MIP images confirms the above findings IMPRESSION: 1. No large vessel occlusion. 2. Unchanged 55-60% proximal right ICA stenosis. 3. New moderate bilateral vertebral artery origin stenoses. 4. Severe proximal basilar artery stenosis, stable to mildly progressed. 5. Unchanged moderate left and mild right intracranial ICA stenoses. 6.  Aortic Atherosclerosis (ICD10-I70.0). Electronically Signed   By: Dasie Hamburg M.D.   On: 10/23/2023 14:40   CT HEAD CODE STROKE WO CONTRAST Result Date: 10/23/2023 EXAM: CT HEAD WITHOUT 10/23/2023 12:21:24 PM TECHNIQUE: CT of the head was performed without the administration of intravenous contrast. Automated exposure control, iterative reconstruction, and/or weight based adjustment of the mA/kV was utilized to reduce the radiation dose to as low as reasonably achievable. COMPARISON: MR head without contrast 07/28/2023. CT head without contrast 01/10/2023. CLINICAL HISTORY: Neuro deficit, acute, stroke suspected. Code stroke DR Juanell [phone] FINDINGS: BRAIN AND VENTRICLES: No acute intracranial hemorrhage. No mass effect or midline shift. No extra-axial fluid collection. Gray-white differentiation is maintained. No hydrocephalus. Remote lacunar infarcts of the basal ganglia and corona radiata are stable. Periventricular white matter hypoattenuation is moderately advanced for age, stable. ORBITS: No acute abnormality. SINUSES AND MASTOIDS: No acute abnormality. SOFT TISSUES AND SKULL: No acute skull fracture. No acute soft tissue abnormality. VASCULATURE: Atherosclerotic calcifications are present in the cavernous carotid arteries bilaterally and at the dural margin of both vertebral arteries. No hyperdense vessel is present. [place] stroke program early CT (ASPECT) score ----- Ganglionic (caudate, internal capsule, lentiform nucleus, insula, M1-M3): 7 Supraganglionic (M4-M6): 3 Total: 10  IMPRESSION: 1. No acute intracranial abnormality. 2. Stable remote lacunar infarcts of the basal ganglia and corona radiata. 3. Moderately advanced periventricular white matter hypoattenuation, stable. 4. Atherosclerotic calcifications in the cavernous carotid arteries bilaterally and at the dural margin of both vertebral arteries. No hyperdense vessel is present. Findings were communicated to Dr. Jerrie via the [facility] system at 12:30 pm. Electronically signed by: Lonni Necessary MD 10/23/2023 12:32 PM EDT RP Workstation: [id number]    ECHO as above  TELEMETRY reviewed by me 10/25/2023: off tele  EKG reviewed by me: sinus rhythm, LBBB, rate 76 bpm (unchanged from prior EKG)  Data reviewed by me 10/25/2023: last 24h vitals tele labs imaging I/O hospitalist progress notes.  Principal Problem:   TIA (transient ischemic attack) Active Problems:   Atypical chest pain   Dysarthria    ASSESSMENT AND PLAN:  Candace [name] is a 73 y.o. female  with a past medical history of severe 3  vessel coronary artery disease s/p CABG x 4 (atretic LIMA-LAD, occluded SVG to OM, patent SVG to RCA, with -70-80% mid LAD, 60-70% diffuse LCx disease (small vessel) and 90% distal RCA by [facility] 07/11/2020), hypertension, hyperlipidemia, orthostatic hypotension, chronic HFpEF (EF 65-70%, G1 DD 06/2022), paroxysmal atrial fibrillation (Eliquis ), hx TIA, DM2, OSA on CPAP, essential tremor  who presented to the ED on 10/23/2023 for headache, blurry vision, slurred speech and atypical chest pain. Code Stroke was called. CT and MRI negative for acute stroke.  Cardiology was consulted for further evaluation of atypical chest pain.   # Atypical chest pain # Coronary artery disease s/p CABG x4 # Hypertension # Hyperlipidemia # Orthostatic hypotension Troponins negative x 2.  EKG without acute ischemic changes. Recent Cardiac PET stress test on 08/2023 that Dr. Florencio reviewed and decided to pursue medical management with  optimizing antianginals. Patient states since then her chest pain sxs have improved. Patients intermittent chest pain does not appear cardiac in nature, currently without chest pain. -Continue home atorvastatin  80 mg, Zetia  10 mg daily. -Continue Ranexa  1000 mg BID.  -Continue home midodrine  2.5 mg BID for orthostatic hypotension. -Increased Imdur  to home dose of 120 mg daily (held initially due to soft BP).  -On propanolol for tremors.  -Patient hasn't been on antihypertensives due to softer BP.  -No plan for LHC at this time, especially in setting of TIA as this would be high risk.  Patient's atypical chest pain does not appear cardiac in nature. Patient reports improvement to chest pain with antianginal medications. Continue with optimizing antianginal medications at this time. Can consider outpatient LHC if patient has worsening anginal symptoms.  # Chronic HFpEF Patient without SOB, [name]. Patient appears near euvolemia.   -Resume home empagliflozin  10 mg daily.   # Paroxsymal atrial fibrillation EKG in ED in sinus rhythm, per tele sinus rhythm with controlled rates.  -Continue Eliquis  5 mg twice daily for stroke reduction. -Rate control medications limited by lower BP.  # TIA, hx TIA CT head/MRI brain negative for acute stroke. CTA head/neck with no large vessel occlusion, new mod bilateral vertebral artery stenosis. -Neurology consulted and following. -Vascular surgery consulted and following.  Recommend patient ambulate. No further cardiac recommendations at this time. Cardiology will sign off. Please haiku with questions or re-engage if needed.   This patient's plan of care was discussed and created with Dr. Wilburn and he is in agreement.  Signed: Dorene Comfort, PA-C  10/25/2023, 10:02 AM [facility] Cardiology

## 2023-10-25 NOTE — TOC Initial Note (Signed)
 Transition of Care [facility]) - Initial/Assessment Note    Patient Details  Name: Candace [name] MRN: [id number] Date of Birth: 1950-06-14  Transition of Care [facility]) CM/SW Contact:    [name], RN Phone Number: 10/25/2023, 2:27 PM  Clinical Narrative:                 TOC met with the patient in the room. She is from independently and denies any difficulty completing ADLs. She currently drives and is able to get to appts and to get medications. She has several children and grandchildren who live nearby and offer support as needed. She has a daughter who owns a private duty service as well as a granddaughter who is a Lawyer. The patient was offered SNF choice, but she is adamant that she will return home. She was active with [facility] in the past, and would like to go home with [facility] PT/OT with [facility].   TOC will continue to follow  Expected Discharge Plan: Home w Home Health Services Barriers to Discharge: Continued Medical Work up   Patient Goals and CMS Choice     Choice offered to / list presented to : Patient      Expected Discharge Plan and Services   Discharge Planning Services: CM Consult Post Acute Care Choice: Home Health Living arrangements for the past 2 months: Single Family Home                   DME Agency: [facility]                  Prior Living Arrangements/Services Living arrangements for the past 2 months: Single Family Home Lives with:: Self                   Activities of Daily Living   ADL Screening (condition at time of admission) Independently performs ADLs?: Yes (appropriate for developmental age)  Permission Sought/Granted                  Emotional Assessment   Attitude/Demeanor/Rapport: Engaged Affect (typically observed): Appropriate Orientation: : Oriented to Self, Oriented to Place, Oriented to  Time, Oriented to Situation   Psych Involvement: No (comment)  Admission diagnosis:  TIA (transient  ischemic attack) [G45.9] Dysarthria [R47.1] Moderate risk chest pain [R07.9] Vertebral artery stenosis/occlusion, bilateral [I65.03] Patient Active Problem List   Diagnosis Date Noted   Dysarthria 10/23/2023   Near syncope 07/28/2023   Overweight (BMI 25.0-29.9) 07/28/2023   Prolonged QT interval 07/28/2023   Syncope 01/10/2023   Back pain 01/10/2023   DNR (do not resuscitate) 06/30/2022   Suprapubic abdominal pain 06/30/2022   Blurred vision, right eye 01/08/2022   History of vertigo 08/16/2021   Obesity (BMI 30-39.9) 08/14/2021   Atrial fibrillation with RVR (HCC) 08/13/2021   Hypokalemia 08/12/2021   COVID-19 08/11/2021   Orthostatic hypotension 08/11/2021   Left hip pain 06/30/2021   Paroxysmal atrial fibrillation (HCC) 07/10/2020   History of coronary artery bypass graft 07/10/2020   Vertigo    Unsteady gait    Acute bronchitis 03/07/2019   Esophageal dysphagia    Stomach irritation    Gastric polyp    Esophageal lump    Hx of colonic polyp    Polyp of colon    Diverticulosis of large intestine without diverticulitis    PSVT (paroxysmal supraventricular tachycardia) (HCC) 10/03/2018   Abnormal ECG 07/05/2018   Tachycardia 03/27/2018   Pain in limb 02/28/2018  Chronic diastolic CHF (congestive heart failure) (HCC) 11/03/2017   TIA (transient ischemic attack) 11/03/2017   COPD suggested by initial evaluation (HCC) 08/23/2017   Incidental pulmonary nodule 08/01/2017   Non-ST elevation myocardial infarction (NSTEMI), subendocardial infarction, subsequent episode of care (HCC) 06/01/2017   B12 deficiency 12/01/2016   Vitamin D  deficiency 11/04/2016   Depression with anxiety 09/30/2016   Generalized weakness 09/30/2016   Tremors of nervous system 09/30/2016   Mild obstructive sleep apnea 09/30/2016   Headache 08/18/2016   Unstable angina (HCC) 08/03/2016   Type 2 diabetes mellitus with diabetic polyneuropathy, with long-term current use of insulin  (HCC) 08/03/2016    Chronic daily headache 05/05/2016   Asthma 11/11/2015   S/P CABG x 4 11/10/2015   Presence of aortocoronary bypass graft 11/10/2015   Coronary artery disease 11/07/2015   Carotid artery narrowing 02/08/2014   Chest pain 02/08/2014   HLD (hyperlipidemia) 02/08/2014   Bilateral carotid artery stenosis 02/08/2014   Atypical chest pain 02/07/2014   Essential hypertension 02/07/2014   Awareness of heartbeats 02/07/2014   Diarrhea 10/05/2013   Gastroesophageal reflux disease 10/05/2013   PCP:  Bertrum Charlie CROME, MD Pharmacy:   [facility] [address] [place] - [address] [place] [address] Phone: [phone] Fax: [phone]     Social Drivers of Health (SDOH) Social History: SDOH Screenings   Food Insecurity: No Food Insecurity (10/23/2023)  Housing: Low Risk  (10/23/2023)  Transportation Needs: No Transportation Needs (10/23/2023)  Utilities: Not At Risk (10/23/2023)  Alcohol Screen: Low Risk  (08/10/2021)  Depression (PHQ2-9): Low Risk  (03/26/2022)  Financial Resource Strain: Low Risk  (01/26/2023)   Received from [facility]  Physical Activity: Inactive (01/26/2023)   Received from [facility]  Social Connections: Moderately Isolated (10/23/2023)  Stress: No Stress Concern Present (03/26/2022)  Tobacco Use: Medium Risk (10/23/2023)  Health Literacy: Adequate Health Literacy (01/26/2023)   Received from [facility]   SDOH Interventions:     Readmission Risk Interventions     No data to display

## 2023-10-25 NOTE — Plan of Care (Signed)

## 2023-10-25 NOTE — Plan of Care (Signed)
  Problem: Education: Goal: Ability to describe self-care measures that may prevent or decrease complications (Diabetes Survival Skills Education) will improve Outcome: Progressing   Problem: Coping: Goal: Ability to adjust to condition or change in health will improve Outcome: Progressing   Problem: Health Behavior/Discharge Planning: Goal: Ability to identify and utilize available resources and services will improve Outcome: Progressing Goal: Ability to manage health-related needs will improve Outcome: Progressing   Problem: Fluid Volume: Goal: Ability to maintain a balanced intake and output will improve Outcome: Adequate for Discharge   Problem: Nutritional: Goal: Maintenance of adequate nutrition will improve Outcome: Adequate for Discharge   Problem: Skin Integrity: Goal: Risk for impaired skin integrity will decrease Outcome: Adequate for Discharge

## 2023-10-26 DIAGNOSIS — G459 Transient cerebral ischemic attack, unspecified: Secondary | ICD-10-CM | POA: Diagnosis not present

## 2023-10-26 LAB — GLUCOSE, CAPILLARY
Glucose-Capillary: 106 mg/dL — ABNORMAL HIGH (ref 70–99)
Glucose-Capillary: 112 mg/dL — ABNORMAL HIGH (ref 70–99)

## 2023-10-26 MED ORDER — POLYETHYLENE GLYCOL 3350 17 G PO PACK: 17.0000 g | PACK | Freq: Every day | ORAL | Status: DC

## 2023-10-26 MED ORDER — DOCUSATE SODIUM 100 MG PO CAPS: 100.0000 mg | ORAL_CAPSULE | Freq: Two times a day (BID) | ORAL | Status: DC

## 2023-10-26 NOTE — Discharge Summary (Signed)
 Physician Discharge Summary   Patient: Candace [name] MRN: [id number] DOB: 10/14/1950  Admit date:     10/23/2023  Discharge date: 10/26/23  Discharge Physician: Drue ONEIDA Potter   PCP: Bertrum Charlie CROME, MD   Recommendations at discharge:  Follow-up on PCP  Discharge Diagnoses:  TIA vs hypotension / BPPV Acute dysarthria, improving Near syncope, persistent orthostatic lightheadedness  Orthostatic hypotension Anginal-like chest pain Multivessel CAD status post CABG Chronic stable angina Chronic HFrEF not in acute exacerbation  PAF IIDM Tremors Suspect parkinsonian syndrome overweight based on BMI: Body mass index is 28.35 kg/m  Hospital Course: From HPI Candace [name] is a 73 y.o. female with medical history significant of BPPV, PAF on Eliquis , HTN, HLD, IDDM, multivessel CAD s/p CABG, tremors on Sinemet  essential tremor with possible superimposed parkinsonism, occipital neuralgia and chronic tension headache, esophageal dysphagia, orthostatic hypotension, chronic combined HFrEF and HFpEF with LVEF 43-51%, OSA on CPAP, presented with multiple complaints of headache, blurry vision, slurred speech and chest pain. She reports she woke up in her usual state of health at 5 AM 07/27. 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 While at [employer] school she began to have blurred vision and slurred speech. 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 However they activated EMS and she was transported to the hospital. She was given 3 tablets of nitroglycerin  as well as 4 baby aspirin  per EMS. On arrival to the ED because of concern for slurred speech code stroke was activated    07/27: to ED. Code stroke and neurology consult - recs CTA H/N and MRI brain  non-acute. Did now bilateral vertebral artery stenosis - per vascular surgery there is no indication for procedural intervention on this, per Dr Laurence CTA does NOT demonstrate ICA stenoses >50% so no indication for carotid intervention per vascular surgery  07/28: cardiology consult re chest pain and question need further ischemic workup, resume Imdur  and up-titrate as able to 120 mg, no plan for cardiac cath at this time. PT still dizzy / unsteady, PT/OT recs for SNF pt would like to go home, will reattempt in AM and decide for rehab or home health.  07/29: improved today no longer meeting criteria for SNF, still lightheaded and needs assistance. Daughter will be taking patient home to live w/ her, orders in this afternoon for DME/HH and anticipate discharge in AM.  7/30: Patient is definitely agreed for discharge today with home health. Patient was very insistent on going back to her driving however we encouraged her to not to drive on account of medical conditions and she can follow-up with a neurologist if this were to change in future.   Consultants:  Neurology  Vascular Surgery  Cardiology    Procedures/Surgeries: none     Disposition: Home Diet recommendation:  Cardiac diet DISCHARGE MEDICATION: Allergies as of 10/26/2023       Reactions   Tramadol  Other (See Comments)   Causes patient to be off balance and Mental Changes   Lisinopril  Cough   Penicillins Swelling, Rash, Other (See Comments)   Did it involve swelling of the face/tongue/throat, SOB, or low BP? Yes Did it involve sudden or severe rash/hives, skin peeling, or any reaction on  the inside of your mouth or nose? Yes Did you need to seek medical attention at a hospital or doctor's office? Yes When did it last happen?      15 years If all above answers are "NO", may proceed with cephalosporin use.        Medication List     TAKE these medications    acetaminophen  325 MG tablet Commonly known as: TYLENOL  Take 650  mg by mouth every 6 (six) hours as needed for moderate pain or headache.   albuterol  108 (90 Base) MCG/ACT inhaler Commonly known as: VENTOLIN  HFA Inhale 2 puffs into the lungs every 6 (six) hours as needed for wheezing or shortness of breath.   atorvastatin  80 MG tablet Commonly known as: LIPITOR  Take 1 tablet (80 mg total) by mouth daily.   carbidopa -levodopa  10-100 MG tablet Commonly known as: SINEMET  IR Take 1 tablet by mouth 3 (three) times daily.   Comfort EZ Pen Needles 32G X 4 MM Misc Generic drug: Insulin  Pen Needle Use to inject insulin  daily   Eliquis  5 MG Tabs tablet Generic drug: apixaban  Take 5 mg by mouth 2 (two) times daily.   ezetimibe  10 MG tablet Commonly known as: ZETIA  Take 1 tablet (10 mg total) by mouth daily.   glucose blood test strip Use to check blood sugars daily as instructed   isosorbide  mononitrate 120 MG 24 hr tablet Commonly known as: IMDUR  Take 1 tablet (120 mg total) by mouth daily.   Jardiance  10 MG Tabs tablet Generic drug: empagliflozin  Take 25 mg by mouth every morning.   Lantus  SoloStar 100 UNIT/ML Solostar Pen Generic drug: insulin  glargine Inject 12 Units into the skin at bedtime.   midodrine  2.5 MG tablet Commonly known as: PROAMATINE  Take 2.5 mg by mouth 2 (two) times daily with a meal.   nitroGLYCERIN  0.4 MG SL tablet Commonly known as: NITROSTAT  Place 1 tablet (0.4 mg total) under the tongue every 5 (five) minutes as needed for chest pain.   ondansetron  8 MG disintegrating tablet Commonly known as: ZOFRAN -ODT Take 8 mg by mouth every 8 (eight) hours as needed.   OneTouch Delica Plus Lancet33G Misc USE UP TO 4 TIMES DAILY AS DIRECTED   OneTouch Verio w/Device Kit Use daily to check blood sugar   pantoprazole  20 MG tablet Commonly known as: Protonix  Take 1 tablet (20 mg total) by mouth 2 (two) times daily.   propranolol  ER 60 MG 24 hr capsule Commonly known as: INDERAL  LA Take 60 mg by mouth daily.    ranolazine  1000 MG SR tablet Commonly known as: RANEXA  Take 1 tablet (1,000 mg total) by mouth 2 (two) times daily.   sertraline  100 MG tablet Commonly known as: ZOLOFT  Take 1 tablet (100 mg total) by mouth at bedtime.   sucralfate  1 g tablet Commonly known as: CARAFATE  Take 1 g by mouth 4 (four) times daily.   Trulicity  1.5 MG/0.5ML Soaj Generic drug: Dulaglutide  Inject 1.5 mg into the skin once a week. Typically takes Weds, ok to administer 01/14/23 has not had it this week, can resume weekly on Fridays after today's dose               Durable Medical Equipment  (From admission, onward)           Start     Ordered   10/25/23 1648  For home use only DME Walker rolling  Once       Question Answer Comment  Walker: With 5 Inch  Wheels   Patient needs a walker to treat with the following condition Debility      10/25/23 1647            Follow-up Information     Bertrum Charlie CROME, MD Follow up.   Specialty: Family Medicine Why: hospital follow up Contact information: [address] [place] [address] [phone]         Florencio Cara BIRCH, MD. Go in 1 week(s).   Specialties: Cardiology, Internal Medicine Contact information: [address] [place] [address] [phone]         [facility] Follow up.   Why: Leopoldo will call to schedule the first PT/OT appointment. Contact information: [address] [place] [address] [phone]                Discharge Exam: Filed Weights   10/23/23 1157  Weight: 70.3 kg   Cardiovascular:     Rate and Rhythm: Normal rate and regular rhythm.  Pulmonary:     Effort: Pulmonary effort is normal.     Breath sounds: Normal breath sounds.  Abdominal:     General: Bowel sounds are normal.  Neurological:     Mental Status: She is alert and oriented to person, place, and time. Mental status is at baseline.  Psychiatric:        Mood and Affect: Mood  normal.        Behavior: Behavior normal.  Condition at discharge: good  The results of significant diagnostics from this hospitalization (including imaging, microbiology, ancillary and laboratory) are listed below for reference.   Imaging Studies: ECHOCARDIOGRAM COMPLETE Result Date: 10/24/2023    ECHOCARDIOGRAM REPORT   Patient Name:   DOLLENE MALLERY Duling Date of Exam: 10/24/2023 Medical Rec #:  [id number]       Height:       62.0 in Accession #:    [id number]      Weight:       155.0 lb Date of Birth:  20-Jan-1951       BSA:          1.715 m Patient Age:    73 years        BP:           126/66 mmHg Patient Gender: F               HR:           67 bpm. Exam Location:  [facility] Procedure: 2D Echo, Cardiac Doppler, Color Doppler and Saline Contrast Bubble            Study (Both Spectral and Color Flow Doppler were utilized during            procedure). Indications:     Stroke I63.9  History:         Patient has prior history of Echocardiogram examinations, most                  recent 07/01/2022. CHF, Arrythmias:Atrial Fibrillation; Risk                  Factors:Hypertension.  Sonographer:     Christopher Furnace Referring Phys:  [id number] CORT ONEIDA MANA Diagnosing Phys: Keller Alluri IMPRESSIONS  1. Left ventricular ejection fraction, by estimation, is 60 to 65%. The left ventricle has normal function. The left ventricle has no regional wall motion abnormalities. There is moderate left ventricular hypertrophy. Left ventricular diastolic parameters are consistent  with Grade I diastolic dysfunction (impaired relaxation).  2. Right ventricular systolic function is normal. The right ventricular size is normal.  3. The mitral valve is normal in structure. Trivial mitral valve regurgitation. Moderate mitral annular calcification.  4. Aortic valve regurgitation is not visualized. Aortic valve sclerosis/calcification is present, without any evidence of aortic stenosis.  5. Agitated saline contrast bubble study was negative, with no  evidence of any interatrial shunt. FINDINGS  Left Ventricle: Left ventricular ejection fraction, by estimation, is 60 to 65%. The left ventricle has normal function. The left ventricle has no regional wall motion abnormalities. The left ventricular internal cavity size was normal in size. There is  moderate left ventricular hypertrophy. Left ventricular diastolic parameters are consistent with Grade I diastolic dysfunction (impaired relaxation). Right Ventricle: The right ventricular size is normal. No increase in right ventricular wall thickness. Right ventricular systolic function is normal. Left Atrium: Left atrial size was normal in size. Right Atrium: Right atrial size was normal in size. Pericardium: There is no evidence of pericardial effusion. Mitral Valve: The mitral valve is normal in structure. Moderate mitral annular calcification. Trivial mitral valve regurgitation. MV peak gradient, 6.5 mmHg. The mean mitral valve gradient is 3.0 mmHg. Tricuspid Valve: The tricuspid valve is normal in structure. Tricuspid valve regurgitation is mild. Aortic Valve: Aortic valve regurgitation is not visualized. Aortic valve sclerosis/calcification is present, without any evidence of aortic stenosis. Aortic valve mean gradient measures 4.7 mmHg. Aortic valve peak gradient measures 9.3 mmHg. Aortic valve  area, by VTI measures 1.58 cm. Pulmonic Valve: The pulmonic valve was not well visualized. Pulmonic valve regurgitation is not visualized. Aorta: The aortic root is normal in size and structure. Venous: The inferior vena cava was not well visualized. IAS/Shunts: The atrial septum is grossly normal. Agitated saline contrast was given intravenously to evaluate for intracardiac shunting. Agitated saline contrast bubble study was negative, with no evidence of any interatrial shunt.  LEFT VENTRICLE PLAX 2D LVIDd:         4.00 cm   Diastology LVIDs:         2.70 cm   LV e' medial:    6.20 cm/s LV PW:         1.60 cm   LV E/e'  medial:  17.1 LV IVS:        1.40 cm   LV e' lateral:   5.87 cm/s LVOT diam:     2.00 cm   LV E/e' lateral: 18.1 LV SV:         47 LV SV Index:   27 LVOT Area:     3.14 cm  RIGHT VENTRICLE RV Basal diam:  3.00 cm RV Mid diam:    2.00 cm RV S prime:     7.94 cm/s TAPSE (M-mode): 1.7 cm LEFT ATRIUM             Index        RIGHT ATRIUM           Index LA diam:        3.30 cm 1.92 cm/m   RA Area:     13.30 cm LA Vol (A2C):   56.5 ml 32.94 ml/m  RA Volume:   28.30 ml  16.50 ml/m LA Vol (A4C):   47.2 ml 27.52 ml/m LA Biplane Vol: 53.8 ml 31.36 ml/m  AORTIC VALVE AV Area (Vmax):    1.56 cm AV Area (Vmean):   1.60 cm AV Area (VTI):     1.58  cm AV Vmax:           152.33 cm/s AV Vmean:          97.167 cm/s AV VTI:            0.299 m AV Peak Grad:      9.3 mmHg AV Mean Grad:      4.7 mmHg LVOT Vmax:         75.80 cm/s LVOT Vmean:        49.500 cm/s LVOT VTI:          0.150 m LVOT/AV VTI ratio: 0.50  AORTA Ao Root diam: 2.50 cm MITRAL VALVE                TRICUSPID VALVE MV Area (PHT): 3.43 cm     TR Peak grad:   22.3 mmHg MV Area VTI:   1.39 cm     TR Vmax:        236.00 cm/s MV Peak grad:  6.5 mmHg MV Mean grad:  3.0 mmHg     SHUNTS MV Vmax:       1.27 m/s     Systemic VTI:  0.15 m MV Vmean:      88.1 cm/s    Systemic Diam: 2.00 cm MV Decel Time: 221 msec MV E velocity: 106.00 cm/s MV A velocity: 109.00 cm/s MV E/A ratio:  0.97 Keller Paterson Electronically signed by Keller Paterson Signature Date/Time: 10/24/2023/6:47:04 PM    Final    MR BRAIN WO CONTRAST Result Date: 10/23/2023 CLINICAL DATA:  Stroke, follow up.  Headache. EXAM: MRI HEAD WITHOUT CONTRAST TECHNIQUE: Multiplanar, multiecho pulse sequences of the brain and surrounding structures were obtained without intravenous contrast. COMPARISON:  Head CT and CTA 10/23/2023 and MRI 07/28/2023 FINDINGS: Brain: There is no evidence of an acute infarct, intracranial hemorrhage, mass, midline shift, or extra-axial fluid collection. Cerebral volume is within  normal limits for age. The ventricles are normal in size. Patchy T2 hyperintensities in the cerebral white matter are similar to the prior MRI and are nonspecific but compatible with moderate chronic small vessel ischemic disease. Chronic lacunar infarcts are again noted in the basal ganglia and deep white matter bilaterally, and there is also an unchanged small chronic cortical infarct in the medial right parietal lobe. Vascular: Major intracranial vascular flow voids are preserved. Skull and upper cervical spine: Unremarkable bone marrow signal. Sinuses/Orbits: Unremarkable orbits. Paranasal sinuses and mastoid air cells are clear. Other: None. IMPRESSION: 1. No acute intracranial abnormality. 2. Moderate chronic small vessel ischemic disease. Electronically Signed   By: Dasie Hamburg M.D.   On: 10/23/2023 15:18   CT ANGIO HEAD NECK W WO CM Result Date: 10/23/2023 CLINICAL DATA:  Neuro deficit, acute, stroke suspected.  Headache. EXAM: CT ANGIOGRAPHY HEAD AND NECK WITH AND WITHOUT CONTRAST TECHNIQUE: Multidetector CT imaging of the head and neck was performed using the standard protocol during bolus administration of intravenous contrast. Multiplanar CT image reconstructions and MIPs were obtained to evaluate the vascular anatomy. Carotid stenosis measurements (when applicable) are obtained utilizing NASCET criteria, using the distal internal carotid diameter as the denominator. RADIATION DOSE REDUCTION: This exam was performed according to the departmental dose-optimization program which includes automated exposure control, adjustment of the mA and/or kV according to patient size and/or use of iterative reconstruction technique. CONTRAST:  75mL OMNIPAQUE  IOHEXOL  350 MG/ML SOLN COMPARISON:  CTA head and neck 01/01/2022 FINDINGS: CTA NECK FINDINGS Aortic arch: Standard branching with mild atherosclerosis. No significant stenosis of  the arch vessel origins. Right carotid system: Patent with calcified plaque about  the carotid bifurcation resulting in 55-60% stenosis of the proximal ICA, unchanged. Left carotid system: Patent with scattered calcified and soft plaque throughout the common carotid artery and about the carotid bifurcation without evidence of a significant stenosis. Vertebral arteries: Patent with the left being slightly dominant. Calcified plaque at both vertebral artery origins resulting in new moderate stenosis bilaterally. Skeleton: Advanced cervical spondylosis. Absent maxillary dentition. Carious right mandibular molar. Other neck: No evidence of cervical lymphadenopathy or mass. Upper chest: No mass or consolidation in the included lung apices. Review of the MIP images confirms the above findings CTA HEAD FINDINGS Anterior circulation: The internal carotid arteries are patent from skull base to carotid termini with calcified plaque resulting in mild right and moderate left cavernous and mild bilateral supraclinoid stenoses, similar to the prior CTA. ACAs and MCAs are patent without evidence of a proximal branch occlusion or significant A1 or M1 stenosis. Branch vessel irregularity is again noted including an unchanged moderate distal left M2 branch vessel stenosis. No aneurysm is identified. Posterior circulation: The intracranial vertebral arteries are patent to the basilar with atherosclerosis in the left V4 segment not resulting in a flow limiting stenosis. The basilar artery is patent with a severe stenosis proximally which is stable to mildly progressive. There are large right and diminutive or absent left posterior communicating arteries with hypoplasia of the right P1 segment. Both PCAs are patent with branch vessel irregularity but no evidence of a significant proximal stenosis. No aneurysm is identified. Venous sinuses: As permitted by contrast timing, patent. Anatomic variants: Fetal right PCA. Review of the MIP images confirms the above findings IMPRESSION: 1. No large vessel occlusion. 2.  Unchanged 55-60% proximal right ICA stenosis. 3. New moderate bilateral vertebral artery origin stenoses. 4. Severe proximal basilar artery stenosis, stable to mildly progressed. 5. Unchanged moderate left and mild right intracranial ICA stenoses. 6.  Aortic Atherosclerosis (ICD10-I70.0). Electronically Signed   By: Dasie Hamburg M.D.   On: 10/23/2023 14:40   CT HEAD CODE STROKE WO CONTRAST Result Date: 10/23/2023 EXAM: CT HEAD WITHOUT 10/23/2023 12:21:24 PM TECHNIQUE: CT of the head was performed without the administration of intravenous contrast. Automated exposure control, iterative reconstruction, and/or weight based adjustment of the mA/kV was utilized to reduce the radiation dose to as low as reasonably achievable. COMPARISON: MR head without contrast 07/28/2023. CT head without contrast 01/10/2023. CLINICAL HISTORY: Neuro deficit, acute, stroke suspected. Code stroke DR Juanell [id number] FINDINGS: BRAIN AND VENTRICLES: No acute intracranial hemorrhage. No mass effect or midline shift. No extra-axial fluid collection. Gray-white differentiation is maintained. No hydrocephalus. Remote lacunar infarcts of the basal ganglia and corona radiata are stable. Periventricular white matter hypoattenuation is moderately advanced for age, stable. ORBITS: No acute abnormality. SINUSES AND MASTOIDS: No acute abnormality. SOFT TISSUES AND SKULL: No acute skull fracture. No acute soft tissue abnormality. VASCULATURE: Atherosclerotic calcifications are present in the cavernous carotid arteries bilaterally and at the dural margin of both vertebral arteries. No hyperdense vessel is present. [place] stroke program early CT (ASPECT) score ----- Ganglionic (caudate, internal capsule, lentiform nucleus, insula, M1-M3): 7 Supraganglionic (M4-M6): 3 Total: 10 IMPRESSION: 1. No acute intracranial abnormality. 2. Stable remote lacunar infarcts of the basal ganglia and corona radiata. 3. Moderately advanced periventricular white  matter hypoattenuation, stable. 4. Atherosclerotic calcifications in the cavernous carotid arteries bilaterally and at the dural margin of both vertebral arteries. No hyperdense vessel is present. Findings were communicated  to Dr. Jerrie via the [facility] system at 12:30 pm. Electronically signed by: Lonni Necessary MD 10/23/2023 12:32 PM EDT RP Workstation: [id number]    Microbiology: Results for orders placed or performed during the hospital encounter of 01/10/23  SARS Coronavirus 2 by RT PCR (hospital order, performed in [facility] hospital lab) *cepheid single result test* Anterior Nasal Swab     Status: None   Collection Time: 01/10/23 10:57 AM   Specimen: Anterior Nasal Swab  Result Value Ref Range Status   SARS Coronavirus 2 by RT PCR NEGATIVE NEGATIVE Final    Comment: (NOTE) SARS-CoV-2 target nucleic acids are NOT DETECTED.  The SARS-CoV-2 RNA is generally detectable in upper and lower respiratory specimens during the acute phase of infection. The lowest concentration of SARS-CoV-2 viral copies this assay can detect is 250 copies / mL. A negative result does not preclude SARS-CoV-2 infection and should not be used as the sole basis for treatment or other patient management decisions.  A negative result may occur with improper specimen collection / handling, submission of specimen other than nasopharyngeal swab, presence of viral mutation(s) within the areas targeted by this assay, and inadequate number of viral copies (<250 copies / mL). A negative result must be combined with clinical observations, patient history, and epidemiological information.  Fact Sheet for Patients:   RoadLapTop.co.za  Fact Sheet for Healthcare Providers: http://kim-miller.com/  This test is not yet approved or  cleared by the [place]  FDA and has been authorized for detection and/or diagnosis of SARS-CoV-2 by FDA under an Emergency Use Authorization  (EUA).  This EUA will remain in effect (meaning this test can be used) for the duration of the COVID-19 declaration under Section 564(b)(1) of the Act, 21 U.S.C. section 360bbb-3(b)(1), unless the authorization is terminated or revoked sooner.  Performed at [facility], [address] [place], [place] [address]     Labs: CBC: Recent Labs  Lab 10/23/23 1200 10/25/23 0421  WBC 6.4 4.7  NEUTROABS 3.5  --   HGB 11.6* 11.9*  HCT 35.3* 37.4  MCV 100.9* 102.5*  PLT 182 187   Basic Metabolic Panel: Recent Labs  Lab 10/23/23 1200 10/25/23 0421  NA 140 142  K 3.9 3.8  CL 105 106  CO2 25 28  GLUCOSE 107* 142*  BUN 11 10  CREATININE 0.89 0.88  CALCIUM  9.0 9.1   Liver Function Tests: Recent Labs  Lab 10/23/23 1200  AST 17  ALT <5  ALKPHOS 100  BILITOT 0.8  PROT 6.7  ALBUMIN  3.4*   CBG: Recent Labs  Lab 10/25/23 1538 10/25/23 1606 10/25/23 2119 10/26/23 0816 10/26/23 1201  GLUCAP 127* 123* 213* 106* 112*    Discharge time spent:  37 minutes.  Signed: Drue ONEIDA Potter, MD [facility] 10/26/2023

## 2023-10-26 NOTE — TOC Transition Note (Signed)
 Transition of Care [facility]) - Discharge Note   Patient Details  Name: Candace [name] MRN: [id number] Date of Birth: [birth year]  [facility]) CM/SW Contact:  [name], RN Phone Number: 10/26/2023, 11:40 AM   Clinical Narrative:     Patient is medically clear to discharge to home with [facility] PT/OT. Referral for RW sent to [facility], but patient declined. The patient's family will transport. No other TOC needs.  Final next level of care: Home w Home Health Services Barriers to Discharge: Barriers Resolved   Patient Goals and CMS Choice     Choice offered to / list presented to : Patient      Discharge Placement                       Discharge Plan and Services Additional resources added to the After Visit Summary for     Discharge Planning Services: CM Consult Post Acute Care Choice: Home Health          DME Arranged: Walker rolling DME Agency: [facility]       HH Arranged: PT, OT HH Agency: [facility] Date [facility] Agency Contacted: 10/26/23 Time HH Agency Contacted: 1140 Representative spoke with at [facility] Agency: Dorothe  Social Drivers of Health (SDOH) Interventions SDOH Screenings   Food Insecurity: No Food Insecurity (10/23/2023)  Housing: Low Risk  (10/23/2023)  Transportation Needs: No Transportation Needs (10/23/2023)  Utilities: Not At Risk (10/23/2023)  Alcohol Screen: Low Risk  (08/10/2021)  Depression (PHQ2-9): Low Risk  (03/26/2022)  Financial Resource Strain: Low Risk  (01/26/2023)   Received from [facility]  Physical Activity: Inactive (01/26/2023)   Received from [facility]  Social Connections: Moderately Isolated (10/23/2023)  Stress: No Stress Concern Present (03/26/2022)  Tobacco Use: Medium Risk (10/23/2023)  Health Literacy: Adequate Health Literacy (01/26/2023)   Received from [facility]     Readmission Risk Interventions     No data to display

## 2023-10-26 NOTE — Plan of Care (Signed)
 Tamea Faith, LPN

## 2023-10-26 NOTE — TOC Progression Note (Signed)
 Transition of Care [facility]) - Progression Note    Patient Details  Name: Candace [name] MRN: [id number] Date of Birth: 01-25-1951  Transition of Care [facility]) CM/SW Contact  [name], RN Phone Number: 10/26/2023, 9:03 AM  Clinical Narrative:    [facility] sent referral for [facility] PT/OT to Leopoldo Cones [phone] accepted the referral. Referral for RW sent to Mitch  with [employer], the RW will be delivered to the room.   Expected Discharge Plan: Home w Home Health Services Barriers to Discharge: Continued Medical Work up               Expected Discharge Plan and Services   Discharge Planning Services: CM Consult Post Acute Care Choice: Home Health Living arrangements for the past 2 months: Single Family Home                   DME Agency: [facility]                   Social Drivers of Health (SDOH) Interventions SDOH Screenings   Food Insecurity: No Food Insecurity (10/23/2023)  Housing: Low Risk  (10/23/2023)  Transportation Needs: No Transportation Needs (10/23/2023)  Utilities: Not At Risk (10/23/2023)  Alcohol Screen: Low Risk  (08/10/2021)  Depression (PHQ2-9): Low Risk  (03/26/2022)  Financial Resource Strain: Low Risk  (01/26/2023)   Received from [facility]  Physical Activity: Inactive (01/26/2023)   Received from [facility]  Social Connections: Moderately Isolated (10/23/2023)  Stress: No Stress Concern Present (03/26/2022)  Tobacco Use: Medium Risk (10/23/2023)  Health Literacy: Adequate Health Literacy (01/26/2023)   Received from [facility]    Readmission Risk Interventions     No data to display

## 2023-11-09 ENCOUNTER — Ambulatory Visit: Admitting: Neuroradiology

## 2023-11-09 ENCOUNTER — Encounter: Payer: Self-pay | Admitting: Neuroradiology

## 2023-11-09 VITALS — BP 139/85 | HR 82 | Ht 62.0 in | Wt 158.0 lb

## 2023-11-09 DIAGNOSIS — I6521 Occlusion and stenosis of right carotid artery: Secondary | ICD-10-CM

## 2023-11-09 DIAGNOSIS — I672 Cerebral atherosclerosis: Secondary | ICD-10-CM | POA: Diagnosis not present

## 2023-11-09 DIAGNOSIS — I651 Occlusion and stenosis of basilar artery: Secondary | ICD-10-CM | POA: Diagnosis not present

## 2023-11-09 NOTE — Progress Notes (Signed)
   Progress Note: Referring Physician:  Jerrie Lola CROME, MD [address] [place],  [place] [address]  Primary Physician:  Bertrum Charlie CROME, MD  Chief Complaint:  TIA, episode of dizziness and difficulty speaking, blurred vision  History of Present Illness: Candace [name] is a 73 y.o. female who presents with the chief complaint of TIA as above.  She was admitted for evaluation and referred for findings of ICAD and right carotid stenosis. No symptoms related to right carotid stenosis.   Exam:  Alert, coversant.  She has a tremor and mild dysarthria.  Normal gait.  Normal strength.   Imaging:  I reviewed the CTA and MRI from 10/23/23.  Calcified right carotid stenosis probably about 50%.  Lower basilar stenosis.  No acute infarct.  No chronic infarct.     Assessment and Plan:  Asymptomatic right carotid stenosis Intracranial athero and basilar stenosis Comorbid conditions of PAF, CHF , multivessel CAD, possible Parkinsons  I do not think her spells are have a vascular cause.  No surgery/intervention needed.  Continue medical therapy for afib and vascular risk factors.

## 2024-01-09 ENCOUNTER — Other Ambulatory Visit: Payer: Self-pay | Admitting: Family Medicine

## 2024-01-09 DIAGNOSIS — Z1231 Encounter for screening mammogram for malignant neoplasm of breast: Secondary | ICD-10-CM

## 2024-02-17 ENCOUNTER — Ambulatory Visit
Admission: RE | Admit: 2024-02-17 | Discharge: 2024-02-17 | Disposition: A | Discharge status: Home / Self Care | Source: Ambulatory Visit | Attending: Family Medicine | Admitting: Family Medicine

## 2024-02-17 DIAGNOSIS — Z1231 Encounter for screening mammogram for malignant neoplasm of breast: Secondary | ICD-10-CM | POA: Diagnosis present

## 2024-05-15 ENCOUNTER — Ambulatory Visit: Admit: 2024-05-15 | Admitting: Ophthalmology

## 2024-05-29 ENCOUNTER — Ambulatory Visit: Admit: 2024-05-29 | Admitting: Ophthalmology

## 2024-06-01 ENCOUNTER — Ambulatory Visit: Admit: 2024-06-01 | Admitting: Gastroenterology
